# Patient Record
Sex: Female | Born: 1945 | Race: Black or African American | Hispanic: No | Marital: Married | Smoking: Former smoker
Health system: Southern US, Community
[De-identification: ages and names within clinical notes are randomized; demographics above are authoritative.]

## PROBLEM LIST (undated history)

## (undated) DIAGNOSIS — IMO0001 Reserved for inherently not codable concepts without codable children: Secondary | ICD-10-CM

## (undated) DIAGNOSIS — K219 Gastro-esophageal reflux disease without esophagitis: Secondary | ICD-10-CM

## (undated) DIAGNOSIS — Z8489 Family history of other specified conditions: Secondary | ICD-10-CM

## (undated) DIAGNOSIS — R011 Cardiac murmur, unspecified: Secondary | ICD-10-CM

## (undated) DIAGNOSIS — E119 Type 2 diabetes mellitus without complications: Secondary | ICD-10-CM

## (undated) DIAGNOSIS — H409 Unspecified glaucoma: Secondary | ICD-10-CM

## (undated) DIAGNOSIS — I493 Ventricular premature depolarization: Secondary | ICD-10-CM

## (undated) DIAGNOSIS — D869 Sarcoidosis, unspecified: Secondary | ICD-10-CM

## (undated) DIAGNOSIS — D649 Anemia, unspecified: Secondary | ICD-10-CM

## (undated) DIAGNOSIS — J42 Unspecified chronic bronchitis: Secondary | ICD-10-CM

## (undated) DIAGNOSIS — G459 Transient cerebral ischemic attack, unspecified: Secondary | ICD-10-CM

## (undated) DIAGNOSIS — M1990 Unspecified osteoarthritis, unspecified site: Secondary | ICD-10-CM

## (undated) DIAGNOSIS — M797 Fibromyalgia: Secondary | ICD-10-CM

## (undated) DIAGNOSIS — J45909 Unspecified asthma, uncomplicated: Secondary | ICD-10-CM

## (undated) DIAGNOSIS — Z9989 Dependence on other enabling machines and devices: Secondary | ICD-10-CM

## (undated) DIAGNOSIS — G4733 Obstructive sleep apnea (adult) (pediatric): Secondary | ICD-10-CM

## (undated) DIAGNOSIS — I4729 Other ventricular tachycardia: Secondary | ICD-10-CM

## (undated) DIAGNOSIS — Z8719 Personal history of other diseases of the digestive system: Secondary | ICD-10-CM

## (undated) DIAGNOSIS — F419 Anxiety disorder, unspecified: Secondary | ICD-10-CM

## (undated) DIAGNOSIS — I491 Atrial premature depolarization: Secondary | ICD-10-CM

## (undated) DIAGNOSIS — I472 Ventricular tachycardia: Secondary | ICD-10-CM

## (undated) DIAGNOSIS — E785 Hyperlipidemia, unspecified: Secondary | ICD-10-CM

## (undated) DIAGNOSIS — Z9289 Personal history of other medical treatment: Secondary | ICD-10-CM

## (undated) DIAGNOSIS — I10 Essential (primary) hypertension: Secondary | ICD-10-CM

## (undated) DIAGNOSIS — I2510 Atherosclerotic heart disease of native coronary artery without angina pectoris: Secondary | ICD-10-CM

## (undated) HISTORY — PX: COLONOSCOPY W/ BIOPSIES AND POLYPECTOMY: SHX1376

## (undated) HISTORY — DX: Ventricular tachycardia: I47.2

## (undated) HISTORY — DX: Atrial premature depolarization: I49.1

## (undated) HISTORY — DX: Other ventricular tachycardia: I47.29

## (undated) HISTORY — DX: Hyperlipidemia, unspecified: E78.5

## (undated) HISTORY — DX: Atherosclerotic heart disease of native coronary artery without angina pectoris: I25.10

## (undated) HISTORY — DX: Ventricular premature depolarization: I49.3

## (undated) HISTORY — DX: Sarcoidosis, unspecified: D86.9

## (undated) HISTORY — DX: Personal history of other medical treatment: Z92.89

## (undated) HISTORY — DX: Fibromyalgia: M79.7

## (undated) HISTORY — PX: TONSILLECTOMY: SUR1361

## (undated) HISTORY — PX: BREAST SURGERY: SHX581

## (undated) HISTORY — PX: EXTERNAL EAR SURGERY: SHX627

---

## 1980-02-11 HISTORY — PX: TOTAL ABDOMINAL HYSTERECTOMY: SHX209

## 1990-02-10 HISTORY — PX: PLACEMENT OF BREAST IMPLANTS: SHX6334

## 1998-05-07 ENCOUNTER — Other Ambulatory Visit: Admission: RE | Admit: 1998-05-07 | Discharge: 1998-05-07 | Payer: Self-pay | Admitting: Obstetrics & Gynecology

## 1998-07-05 ENCOUNTER — Ambulatory Visit (HOSPITAL_COMMUNITY): Admission: RE | Admit: 1998-07-05 | Discharge: 1998-07-05 | Payer: Self-pay | Admitting: Gastroenterology

## 1998-07-05 ENCOUNTER — Encounter: Payer: Self-pay | Admitting: Gastroenterology

## 1998-12-10 ENCOUNTER — Encounter: Payer: Self-pay | Admitting: Urology

## 1998-12-10 ENCOUNTER — Encounter: Admission: RE | Admit: 1998-12-10 | Discharge: 1998-12-10 | Payer: Self-pay | Admitting: Urology

## 1999-01-01 ENCOUNTER — Encounter (INDEPENDENT_AMBULATORY_CARE_PROVIDER_SITE_OTHER): Payer: Self-pay | Admitting: *Deleted

## 1999-01-01 ENCOUNTER — Ambulatory Visit (HOSPITAL_BASED_OUTPATIENT_CLINIC_OR_DEPARTMENT_OTHER): Admission: RE | Admit: 1999-01-01 | Discharge: 1999-01-01 | Payer: Self-pay | Admitting: Urology

## 2000-01-20 ENCOUNTER — Other Ambulatory Visit: Admission: RE | Admit: 2000-01-20 | Discharge: 2000-01-20 | Payer: Self-pay | Admitting: Obstetrics and Gynecology

## 2000-04-04 ENCOUNTER — Emergency Department (HOSPITAL_COMMUNITY): Admission: EM | Admit: 2000-04-04 | Discharge: 2000-04-04 | Payer: Self-pay | Admitting: *Deleted

## 2000-04-04 ENCOUNTER — Encounter: Payer: Self-pay | Admitting: Emergency Medicine

## 2001-02-18 ENCOUNTER — Other Ambulatory Visit: Admission: RE | Admit: 2001-02-18 | Discharge: 2001-02-18 | Payer: Self-pay | Admitting: Urology

## 2001-02-22 ENCOUNTER — Encounter: Payer: Self-pay | Admitting: Urology

## 2001-02-22 ENCOUNTER — Encounter: Admission: RE | Admit: 2001-02-22 | Discharge: 2001-02-22 | Payer: Self-pay | Admitting: Urology

## 2001-06-30 ENCOUNTER — Other Ambulatory Visit: Admission: RE | Admit: 2001-06-30 | Discharge: 2001-06-30 | Payer: Self-pay | Admitting: Obstetrics and Gynecology

## 2001-11-10 ENCOUNTER — Encounter: Payer: Self-pay | Admitting: Orthopedic Surgery

## 2001-11-10 ENCOUNTER — Encounter: Admission: RE | Admit: 2001-11-10 | Discharge: 2001-11-10 | Payer: Self-pay | Admitting: Orthopedic Surgery

## 2001-11-28 ENCOUNTER — Inpatient Hospital Stay (HOSPITAL_COMMUNITY): Admission: AD | Admit: 2001-11-28 | Discharge: 2001-11-28 | Payer: Self-pay | Admitting: Obstetrics and Gynecology

## 2001-11-28 ENCOUNTER — Encounter (INDEPENDENT_AMBULATORY_CARE_PROVIDER_SITE_OTHER): Payer: Self-pay | Admitting: Specialist

## 2002-02-11 ENCOUNTER — Emergency Department (HOSPITAL_COMMUNITY): Admission: EM | Admit: 2002-02-11 | Discharge: 2002-02-11 | Payer: Self-pay | Admitting: Emergency Medicine

## 2002-02-11 ENCOUNTER — Encounter: Payer: Self-pay | Admitting: Emergency Medicine

## 2002-02-17 ENCOUNTER — Ambulatory Visit (HOSPITAL_COMMUNITY): Admission: RE | Admit: 2002-02-17 | Discharge: 2002-02-17 | Payer: Self-pay | Admitting: Family Medicine

## 2002-02-24 ENCOUNTER — Encounter: Payer: Self-pay | Admitting: Family Medicine

## 2002-02-24 ENCOUNTER — Encounter: Admission: RE | Admit: 2002-02-24 | Discharge: 2002-02-24 | Payer: Self-pay | Admitting: Family Medicine

## 2002-03-25 ENCOUNTER — Ambulatory Visit (HOSPITAL_COMMUNITY): Admission: RE | Admit: 2002-03-25 | Discharge: 2002-03-25 | Payer: Self-pay | Admitting: Cardiology

## 2002-03-25 ENCOUNTER — Encounter: Payer: Self-pay | Admitting: Cardiology

## 2002-06-28 ENCOUNTER — Other Ambulatory Visit: Admission: RE | Admit: 2002-06-28 | Discharge: 2002-06-28 | Payer: Self-pay | Admitting: Obstetrics and Gynecology

## 2002-09-28 ENCOUNTER — Emergency Department (HOSPITAL_COMMUNITY): Admission: EM | Admit: 2002-09-28 | Discharge: 2002-09-28 | Payer: Self-pay | Admitting: Emergency Medicine

## 2002-12-20 ENCOUNTER — Ambulatory Visit (HOSPITAL_COMMUNITY): Admission: RE | Admit: 2002-12-20 | Discharge: 2002-12-20 | Payer: Self-pay | Admitting: Obstetrics and Gynecology

## 2003-01-11 ENCOUNTER — Encounter (INDEPENDENT_AMBULATORY_CARE_PROVIDER_SITE_OTHER): Payer: Self-pay

## 2003-01-11 ENCOUNTER — Ambulatory Visit (HOSPITAL_COMMUNITY): Admission: RE | Admit: 2003-01-11 | Discharge: 2003-01-11 | Payer: Self-pay | Admitting: Obstetrics and Gynecology

## 2003-01-11 ENCOUNTER — Encounter (INDEPENDENT_AMBULATORY_CARE_PROVIDER_SITE_OTHER): Payer: Self-pay | Admitting: *Deleted

## 2003-05-12 ENCOUNTER — Encounter: Admission: RE | Admit: 2003-05-12 | Discharge: 2003-05-12 | Payer: Self-pay | Admitting: Family Medicine

## 2003-08-10 ENCOUNTER — Encounter: Admission: RE | Admit: 2003-08-10 | Discharge: 2003-08-10 | Payer: Self-pay | Admitting: Ophthalmology

## 2003-08-15 ENCOUNTER — Encounter: Admission: RE | Admit: 2003-08-15 | Discharge: 2003-08-15 | Payer: Self-pay | Admitting: Ophthalmology

## 2003-12-25 ENCOUNTER — Ambulatory Visit (HOSPITAL_BASED_OUTPATIENT_CLINIC_OR_DEPARTMENT_OTHER): Admission: RE | Admit: 2003-12-25 | Discharge: 2003-12-25 | Payer: Self-pay | Admitting: Family Medicine

## 2004-07-03 ENCOUNTER — Other Ambulatory Visit: Admission: RE | Admit: 2004-07-03 | Discharge: 2004-07-03 | Payer: Self-pay | Admitting: Obstetrics and Gynecology

## 2004-09-30 ENCOUNTER — Encounter: Admission: RE | Admit: 2004-09-30 | Discharge: 2004-09-30 | Payer: Self-pay | Admitting: Ophthalmology

## 2005-06-27 ENCOUNTER — Other Ambulatory Visit: Admission: RE | Admit: 2005-06-27 | Discharge: 2005-06-27 | Payer: Self-pay | Admitting: Obstetrics and Gynecology

## 2006-06-23 ENCOUNTER — Ambulatory Visit (HOSPITAL_COMMUNITY): Admission: RE | Admit: 2006-06-23 | Discharge: 2006-06-23 | Payer: Self-pay | Admitting: Gastroenterology

## 2007-01-11 ENCOUNTER — Ambulatory Visit (HOSPITAL_COMMUNITY): Admission: RE | Admit: 2007-01-11 | Discharge: 2007-01-11 | Payer: Self-pay | Admitting: Ophthalmology

## 2007-03-13 ENCOUNTER — Emergency Department (HOSPITAL_COMMUNITY): Admission: EM | Admit: 2007-03-13 | Discharge: 2007-03-13 | Payer: Self-pay | Admitting: Emergency Medicine

## 2007-04-08 ENCOUNTER — Encounter: Admission: RE | Admit: 2007-04-08 | Discharge: 2007-04-08 | Payer: Self-pay | Admitting: Otolaryngology

## 2007-05-04 ENCOUNTER — Ambulatory Visit (HOSPITAL_COMMUNITY): Admission: RE | Admit: 2007-05-04 | Discharge: 2007-05-04 | Payer: Self-pay | Admitting: Cardiovascular Disease

## 2007-05-04 HISTORY — PX: CARDIAC CATHETERIZATION: SHX172

## 2007-09-10 ENCOUNTER — Encounter: Admission: RE | Admit: 2007-09-10 | Discharge: 2007-09-10 | Payer: Self-pay | Admitting: Rheumatology

## 2008-07-11 ENCOUNTER — Encounter: Admission: RE | Admit: 2008-07-11 | Discharge: 2008-07-11 | Payer: Self-pay | Admitting: Family Medicine

## 2008-12-20 ENCOUNTER — Encounter: Admission: RE | Admit: 2008-12-20 | Discharge: 2008-12-20 | Payer: Self-pay | Admitting: Internal Medicine

## 2010-01-04 ENCOUNTER — Ambulatory Visit: Payer: Self-pay | Admitting: Cardiovascular Disease

## 2010-01-21 ENCOUNTER — Encounter: Payer: Self-pay | Admitting: Internal Medicine

## 2010-01-21 ENCOUNTER — Ambulatory Visit: Payer: Self-pay

## 2010-01-21 ENCOUNTER — Ambulatory Visit (HOSPITAL_COMMUNITY): Admission: RE | Admit: 2010-01-21 | Payer: Self-pay | Source: Home / Self Care | Admitting: Cardiovascular Disease

## 2010-03-06 ENCOUNTER — Ambulatory Visit: Payer: Self-pay | Admitting: Cardiovascular Disease

## 2010-04-16 ENCOUNTER — Other Ambulatory Visit: Payer: Self-pay | Admitting: Adult Health Nurse Practitioner

## 2010-04-16 ENCOUNTER — Other Ambulatory Visit: Payer: Self-pay | Admitting: Internal Medicine

## 2010-04-16 ENCOUNTER — Ambulatory Visit
Admission: RE | Admit: 2010-04-16 | Discharge: 2010-04-16 | Disposition: A | Discharge status: Home / Self Care | Payer: 59 | Source: Ambulatory Visit | Attending: Adult Health Nurse Practitioner | Admitting: Adult Health Nurse Practitioner

## 2010-04-16 DIAGNOSIS — R059 Cough, unspecified: Secondary | ICD-10-CM

## 2010-04-16 DIAGNOSIS — R05 Cough: Secondary | ICD-10-CM

## 2010-04-16 DIAGNOSIS — R509 Fever, unspecified: Secondary | ICD-10-CM

## 2010-06-25 NOTE — Cardiovascular Report (Signed)
NAMEKARLEN, BARBAR NO.:  [id number]   MEDICAL RECORD NO.:  [id number]          PATIENT TYPE:  OIB   LOCATION:  2861                         FACILITY:  [facility]   PHYSICIAN:  Vesta Mixer, M.D. DATE OF BIRTH:  August 14, 1945   DATE OF PROCEDURE:  05/04/2007  DATE OF DISCHARGE:                            CARDIAC CATHETERIZATION   Sheila Reynolds is a 65 year old female with a history of chest pain  and some nonsustained ventricular tachycardia.  She presented to the  office yesterday with episodes consistent with unstable angina.  Her  pains consisted of chest pressure with radiation through to her  intrascapular region.  She has also been having lots fluttering  consistent with her previous episodes of nonsustained VT.  She was  referred for heart catheterization for further evaluation.   PROCEDURE:  Left heart catheterization.   The right femoral artery was cannulated using the assistance of a Smart  needle.  At the end of the case, right femoral artery angiogram revealed  fairly normal and disease-free femoral artery.  The vessel was closed  using an AngioSeal technique.   HEMODYNAMIC RESULTS:  LV pressure is 112/14, and aortic pressure is  106/63.   ANGIOGRAPHY:  Left main:  The left main is smooth and normal.   The left anterior descending artery has sequential 40% stenosis in the  proximal vessel.  The second stenosis is just prior to the bifurcation  of the first diagonal.  The mid and distal LAD had only minor luminal  irregularities.   The first diagonal has a 30-40% proximal stenosis.  There is  calcification in this proximal vessel.   The left circumflex artery is large and is codominant.  The obtuse  marginal artery has minor luminal irregularities.  The distal circumflex  is essentially normal.   The posterior descending artery is relatively small but has minor  luminal irregularities.   The right coronary artery is small and is  codominant.  It supplies a  very small posterior descending artery which is unremarkable.   Left ventriculogram was performed in the 30-degree RAO position.  It  reveals overall normal left ventricular systolic function.  Ejection  fraction 65-70%.   COMPLICATIONS:  None.   CONCLUSION:  Moderate coronary artery disease primarily involving the  left anterior descending.  At this point, I do not think that this LAD  is ischemic, although we will need to do a Cardiolite study to verify  this for sure.  We will continue with medical therapy for the time  being.           ______________________________  Vesta Mixer, M.D.     PJN/MEDQ  D:  05/04/2007  T:  05/04/2007  Job:  [id number]   cc:   Jocelyn Lamer D. Pecola Leisure, M.D.

## 2010-06-25 NOTE — H&P (Signed)
NAMESHARIKA, MOSQUERA NO.:  [id number]   MEDICAL RECORD NO.:  [id number]          PATIENT TYPE:  EMS   LOCATION:  ED                           FACILITY:  [facility]   PHYSICIAN:  Vesta Mixer, M.D. DATE OF BIRTH:  May 23, 1945   DATE OF ADMISSION:  03/13/2007  DATE OF DISCHARGE:  03/13/2007                              HISTORY & PHYSICAL   Evin Chirco is a middle-aged female with a history of  hyperlipidemia, chest pains, and nonsustained ventricular tachycardia.  She is admitted for symptoms consistent with unstable angina.   Ms. Janosik has a history of hyperlipidemia, chest pains, and some  nonsustained ventricular tachycardia.  She has been followed here for  the past year or so.  She has had symptoms of palpitations and on [facility] monitoring has been found to have nonsustained VT.  She has  had a negative stress Cardiolite study a year or so ago.  She was found  to have some mild anterior wall attenuation, but this was thought to be  breast artifact.  She now presents with worsening episodes of chest  pains and chest flutters.  She states that it is similar to her previous  episodes, only worse.  She also had some stinging in her chest, with  radiation through to her back.  These episodes occur every several  minutes.  She also had some dyspnea on exertion.  The dyspnea does not  seem to correlate with the episodes of chest pain.   She is now admitted to the hospital for heart catheterization.   She denies any syncope or pre-syncope.  She denies any PND or orthopnea.   CURRENT MEDICATIONS:  1. HCTZ 25 mg a day.  2. Prednisolone drops 4 times a day.  3. Alprazolam 1 mg a day.  4. Ibuprofen 800 mg a day.  5. Toprol XL 75 mg a day.  6. Protonix 40 mg a day.  7. Lipitor 20 mg a day.  8. Azopt 3 times a day.   ALLERGIES:  None, but she is intolerant to Crestor which cause muscle  aches and pains.   PAST MEDICAL HISTORY:  1. Hypertension.  2.  Gastroesophageal reflux.  3. Hyperlipidemia.  4. Non-sustained ventricular tachycardia.  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   SOCIAL HISTORY:  The patient used to smoke but quit 25 years ago.  She  drinks wine occasionally.   FAMILY HISTORY:  Her father has a history of coronary artery disease and  peripheral vascular disease.  He has a history of diabetes and  hypertension.  Her mother died at age 84, due to stomach cancer.   REVIEW OF SYSTEMS:  Is reviewed and is essentially negative, except as  noted in the HPI.   EXAMINATION:  GENERAL:  She is a middle-aged female in no acute  distress.  She is alert and oriented x3, and her mood and affect are  normal.  VITAL SIGNS:  Her weight is 168, blood pressure is 114/60 with a heart  rate of 72.  HEENT:  Exam reveals 2+ carotids.  She has no bruits, no  JVD, no  thyromegaly.  LUNGS:  Clear to auscultation.  HEART:  Regular rate S1-S2.  ABDOMEN:  Abdominal exam reveals good bowel sounds and is nontender.  EXTREMITIES:  She has no clubbing, cyanosis or edema.  NEURO:  Exam is nonfocal.   Her EKG reveals normal sinus rhythm.  She has nonspecific ST and T wave  changes.   Ms. Denno presents with some episodes of chest pain and some  palpitations.  She has known ventricular tachycardia.  I am somewhat  concerned that this may represent unstable angina.  I have recommended  her for heart catheterization.  We have discussed the benefits and  options of heart catheterization.  She understands and agrees to  proceed.  We have scheduled it for tomorrow afternoon.  I have given her  a prescription for nitroglycerin tablets.  She is to call us right away  or call 9-1-1, if she has any worsening pains.           ______________________________  Vesta Mixer, M.D.     PJN/MEDQ  D:  05/03/2007  T:  05/03/2007  Job:  [id number]   cc:   Jocelyn Lamer D. Pecola Leisure, M.D.

## 2010-06-28 NOTE — Procedures (Signed)
Sheila Reynolds, Sheila Reynolds            ACCOUNT NO.:  [id number]   MEDICAL RECORD NO.:  [id number]          PATIENT TYPE:  OUT   LOCATION:  SLEEP CENTER                 FACILITY:  [facility]   PHYSICIAN:  Clinton D. Maple Hudson, M.D. DATE OF BIRTH:  [birth year]   DATE OF STUDY:  12/25/2003                              NOCTURNAL POLYSOMNOGRAM   REFERRING PHYSICIAN:  Betti D. Pecola Leisure, M.D.   INDICATION FOR STUDY AND HISTORY:  Insomnia with sleep apnea.   SLEEP ARCHITECTURE:  Total sleep time 394 minutes with sleep efficiency of  96%.  Stage I was 5%; stage II, 74%; Stages III and IV were absent.  REM was  21% of total sleep time.  Latency to sleep onset 7.5 minutes, latency to REM  62 minutes.  Awake after sleep onset 8.5 minutes.  Arousal index 22.7.  Note  that Epworth sleepiness score was 20/24 with BMI 29.7, weight 164 pounds.   RESPIRATORY DATA:  RDI 2.9 per hour which is within normal limits.  This  included 1 obstructive apnea and 18 hypopneas.  All events were while  sleeping supine.[name]  REM RDI 4.4 per hour.   OXYGEN DATA:  Moderate and occasionally loud snoring with oxygen  desaturation to a nadir of 91% during apneas and hypopneas.  Mean oxygen  saturation through the study was 96% on room air.   CARDIAC DATA:  Normal sinus rhythm.   MOVEMENT AND PARASOMNIA:  Occasional limb jerks with insignificant impact on  sleep.  No bathroom trips.   IMPRESSION AND RECOMMENDATIONS:  Occasional sleep disorder breathing event,  within normal limits with RDI 2.9 per hour and normal oxygenation at 91%.  Sleep was well maintained for the most part, largely alternating between  Stage II and REM.  She had taken alprazolam at the beginning of the study.   Clinton D. Maple Hudson, M.D.  Diplomate, Biomedical engineer of Sleep Medicine                                                           Clinton D. Maple Hudson, M.D.  Diplomate, [employer] Board   CDY/MEDQ  D:  12/31/2003 10:01:39  T:  12/31/2003 10:22:09  Job:   [id number]   cc:   Jocelyn Lamer D. Pecola Leisure, M.D.  [address]  [place]  [place] [address]  Fax: [phone]

## 2010-06-28 NOTE — Op Note (Signed)
NAME:  Sheila Reynolds, Sheila Reynolds                      ACCOUNT NO.:  [id number]   MEDICAL RECORD NO.:  [id number]                   PATIENT TYPE:  AMB   LOCATION:  SDC                                  FACILITY:  [facility]   PHYSICIAN:  Naima A. Dillard, M.D.              DATE OF BIRTH:  Apr 19, 1945   DATE OF PROCEDURE:  01/11/2003  DATE OF DISCHARGE:                                 OPERATIVE REPORT   PREOPERATIVE DIAGNOSIS:  Pelvic pain with vaginal cuff mass.   POSTOPERATIVE DIAGNOSES:  1. Chronic pelvic pain.  2. Pelvic-abdominal adhesions.  3. Pelvic adhesions.  4. Peritoneal cyst.   PROCEDURES:  1. Open laparoscopy.  2. Lysis of adhesions.  3. Biopsy of anterior cul-de-sac.   SURGEON:  Naima A. Normand Sloop, M.D.   ASSISTANTMarquis Lunch. Adline Peals.   ANESTHESIA:  General endotracheal tube.   ESTIMATED BLOOD LOSS:  Minimal.   FLUIDS REPLACED:  1500 mL crystalloid.   URINE OUTPUT:  100 mL clear urine.   COMPLICATIONS:  None.   FINDINGS:  A small peritoneal cyst along the vaginal cuff with mild scarring  or fibrosis consistent with scar tissue.  Bilateral pelvic sidewall small  bowel adhesions.  Left ovary was absent.  Right ovary was adherent to the  right pelvic sidewall.  There were omental abdominal wall adhesions.  Normal  appendix.  The patient went to the recovery room in stable condition.   PROCEDURE IN DETAIL:  The patient was taken to the operating room, where she  was given general endotracheal anesthesia and placed in dorsal lithotomy  position and prepped and draped in the normal sterile fashion.  A Foley  catheter was placed and a sponge on a stick or a sponge with a ring forceps  was placed in the vagina as a means to identify the vaginal cuff.  Attention  was then turned to the umbilicus, where 5 mL 0.25% Marcaine was placed and  an incision was done with the scalpel in a 10 mm vertical incision and  carried down to the fascia.  The fascia was then grasped with  Kochers, cut  with Mayo scissors.  The peritoneum was identified, tented up, and the  Hasson was placed.  The Hasson was anchored with a 2-0 Vicryl suture placed  in the fascia.  Intra-abdominal placement was confirmed with the  laparoscope.  The abdomen was then insufflated with CO2 gas.  The findings  noted above were seen.  A left lower quadrant and right lower quadrant port  was placed under direct visualization after two 2.5 mL of 0.25% Marcaine was  placed, and the knife was used to make 5 mm incisions and both ports placed  under direct visualization and also paying close attention to avoid the  epigastric vessels.  The left pelvic sidewall adhesions were cut,  coagulated, hemostasis was assured.  The left ovary was absent.  The  patient's left side of  the vaginal cuff where the peritoneal cyst was  removed was sent to pathology.  Upon looking there was some blood seen in  the cul-de-sac, which was irrigated and sent to pathology.  The fibrotic  tissue was also biopsied and sent to pathology.  There were no other masses  seen.  The sponge on a stick in the vagina was then used to identify the  cuff, and there was no large mass seen.  The patient's right ovary was  noticed to be adherent to the pelvic wall.  Adhesions were broken up from  the right ovary to the pelvic wall, also the right ovary to the small bowel.  Hemostasis was assured.  Irrigation was done of the pelvis and along where  lysis of adhesions took place.  Hemostasis was assured.  The patient's  appendix was seen and noted to be normal.  The patient had upper abdominal  wall adhesions up above the port which could not be lysed and most likely  represented omentum to the abdominal wall.  All ports were removed under  direct visualization.  The gas was allowed to leave the abdomen.  The Hasson  fascia, the stitches were tied, hemostasis was assured.  The skin was closed  in subcuticular fashion using 4-0 Vicryl.  The  other two skin incisions were  closed using 4-0 Vicryl.  Sponge, lap, and needle counts were correct x2.  The sponge on a stick was removed from the vagina.  The patient tolerated  the procedure well.  Sponge, lap, and needle counts were correct x2.  The  patient went to the recovery room in stable condition.                                               Naima A. Normand Sloop, M.D.    NAD/MEDQ  D:  01/11/2003  T:  01/12/2003  Job:  [id number]

## 2010-06-28 NOTE — H&P (Signed)
NAME:  Sheila Reynolds, Sheila Reynolds                      ACCOUNT NO.:  [id number]   MEDICAL RECORD NO.:  [id number]                   PATIENT TYPE:  AMB   LOCATION:  SDC                                  FACILITY:  [facility]   PHYSICIAN:  Naima A. Dillard, M.D.              DATE OF BIRTH:  15-Jul-1945   DATE OF ADMISSION:  01/11/2003  DATE OF DISCHARGE:                                HISTORY & PHYSICAL   CHIEF COMPLAINT:  Pelvic pain and vaginal cuff mass.   The patient is a 65 year old African-American female who presented to me  secondary to incidental mass being found at her vaginal cuff and also  complaining of right lower quadrant pain and back pain.  The patient did not  have any relief with Flexeril.  She said Dilaudid, however, was too strong  and made her too sleepy.  She denied any history of urinary tract infection  symptoms, any history of kidney stones.  She has vaginal itching but has no  vaginal discharge.  Denied having any dyspareunia.  The patient denies any  change in bowel habits.  Denies any rectal bleeding, constipation.  She  denied any nausea or vomiting.  Denied any history of endometriosis.  The  patient did have a recent CT and ultrasound, which found about a 10 mm  heterogeneous echo texture identified at the vaginal cuff measuring 10 mm at  maximum diameter.  There was a small hypoechoic central focus, which was  compatible with cystic change.  There was no substantial color Doppler  signal within the focal area.  The patient did have a normal-appearing right  ovary, which measured 0.7 x 0.4 x 1.0, and the left ovary was not  visualized.  The patient has stated she has been having the right lower  quadrant pain and back pain for the last four months and desired  intervention other than the Dilaudid and other medications she was taking.   Her medications include Valtrex p.r.n.   PAST MEDICAL HISTORY:  Significant high blood pressure, irritable bowel  syndrome, and  arthritis.  The patient was unsure what medicine she takes for  high blood pressure.   PAST SURGICAL HISTORY:  Significant for  a total abdominal hysterectomy in  the 1980.  She had left knee surgery.  She also had tonsillectomy and right  ear surgery.   FAMILY HISTORY:  Significant for diabetes and hypertension and a paternal  aunt with breast cancer.   SOCIAL HISTORY:  Negative for alcohol, drug, and tobacco use.  She quit  smoking tobacco 15 years ago.   REVIEW OF SYSTEMS:  CARDIOVASCULAR:  She has mitral valve prolapse.  She  does have antibiotics with surgery.  GASTROINTESTINAL:  She has irritable  bowel syndrome.  RESPIRATORY:  Unremarkable.  MUSCULOSKELETAL:  She has  arthritis.  PSYCHIATRIC:  Unremarkable.   PHYSICAL EXAMINATION:  VITAL SIGNS:  The patient weighs 158 pounds.  Her  blood  pressure is 110/70.  HEENT:  Pupils are equal.  Hearing is normal.  Throat is clear.  NECK:  Thyroid is not enlarged.  CARDIAC:  Heart has a regular rate and rhythm.  CHEST:  Clear to auscultation bilaterally.  BREASTS:  No masses, discharge, retraction, or nipple retraction  bilaterally.  BACK:  No CVA tenderness bilaterally.  ABDOMEN:  Nontender without any masses or organomegaly.  EXTREMITIES:  No clubbing, cyanosis, or edema.  NEUROLOGIC:  Within normal limits.  PELVIC:  External genitalia and vagina are normal.  The uterus and cervix  are surgically absent.  Adnexa has no masses.  Right adnexa is slightly  tender.  Rectovaginal:  No masses.   ASSESSMENT:  Vaginal cuff mass and pelvic pain.  The patient was told of a  differential diagnosis of some kind of vaginal cancer, scar tissue, could be  a parasitic fibroid, could be an ovary.  She was given the option of  laparoscopy and biopsy and removal of this mass, also is to evaluate the  pelvis because of the right lower quadrant pain, or observation and just  redo another ultrasound on this mass in about four to six weeks.  The   patient chose to have laparoscopy with biopsy and removal of the mass and  also evaluation of chronic pelvic pain.  The patient understands that the  risks are but not limited to bleeding, infection, damage to internal organs  such as bowel, bladder, and major blood vessels.  The patient does have a  history of mitral valve prolapse, so we plan to give antibiotics before  surgery.                                               Naima A. Normand Sloop, M.D.    NAD/MEDQ  D:  01/11/2003  T:  01/11/2003  Job:  [id number]

## 2010-07-16 ENCOUNTER — Other Ambulatory Visit: Payer: Self-pay | Admitting: Cardiovascular Disease

## 2010-07-16 NOTE — Telephone Encounter (Signed)
Fax received from pharmacy. Refill completed. Jodette Torsten Weniger RN  

## 2010-09-23 ENCOUNTER — Encounter: Payer: Self-pay | Admitting: Cardiovascular Disease

## 2010-09-24 ENCOUNTER — Ambulatory Visit (INDEPENDENT_AMBULATORY_CARE_PROVIDER_SITE_OTHER): Payer: Self-pay | Admitting: Cardiovascular Disease

## 2010-09-24 ENCOUNTER — Encounter: Payer: Self-pay | Admitting: Cardiovascular Disease

## 2010-09-24 DIAGNOSIS — I4729 Other ventricular tachycardia: Secondary | ICD-10-CM | POA: Insufficient documentation

## 2010-09-24 DIAGNOSIS — R609 Edema, unspecified: Secondary | ICD-10-CM

## 2010-09-24 DIAGNOSIS — R0789 Other chest pain: Secondary | ICD-10-CM | POA: Insufficient documentation

## 2010-09-24 DIAGNOSIS — I472 Ventricular tachycardia: Secondary | ICD-10-CM | POA: Insufficient documentation

## 2010-09-24 DIAGNOSIS — R600 Localized edema: Secondary | ICD-10-CM | POA: Insufficient documentation

## 2010-09-24 DIAGNOSIS — R002 Palpitations: Secondary | ICD-10-CM

## 2010-09-24 DIAGNOSIS — E785 Hyperlipidemia, unspecified: Secondary | ICD-10-CM

## 2010-09-24 DIAGNOSIS — R079 Chest pain, unspecified: Secondary | ICD-10-CM

## 2010-09-24 LAB — TSH: TSH: 0.88 u[IU]/mL (ref 0.35–5.50)

## 2010-09-24 LAB — BASIC METABOLIC PANEL
Chloride: 107 mEq/L (ref 96–112)
GFR: 108.62 mL/min (ref >60.00)
Glucose, Bld: 125 mg/dL — ABNORMAL HIGH (ref 70–99)
Potassium: 4.9 mEq/L (ref 3.5–5.1)
Sodium: 142 mEq/L (ref 135–145)

## 2010-09-24 LAB — HEPATIC FUNCTION PANEL
ALT: 24 U/L (ref 0–35)
Alkaline Phosphatase: 89 U/L (ref 39–117)
Bilirubin, Direct: 0.1 mg/dL (ref 0.0–0.3)

## 2010-09-24 NOTE — Assessment & Plan Note (Signed)
She's not had any severe episodes of chest pain recently.

## 2010-09-24 NOTE — Assessment & Plan Note (Addendum)
She presents with several months of bilateral leg edema. The left leg seems to be more swollen the right. We will get a venous duplex scan for further assessment. Will also get an echocardiogram.   I've asked her to elevate her legs as much as possible. I suggested that she may wear some compression hose when the weather gets cooler. I've asked her to try to walk around as much as possible and to minimize the times that she sits at a desk.

## 2010-09-24 NOTE — Progress Notes (Signed)
Sheila Reynolds Date of Birth  11-30-1945 [facility] [address]     [address] [place], [place]  [address] [phone]  Fax  [phone]  History of Present Illness:  Mrs. heard is a 65 year old lady with a history of chest pain, hyperlipidemia, nonsustained ventricular tachycardia, and mild to moderate coronary artery disease.  This past Sunday she had fairly severe palpitations.  She had a pins and needle sensation in her left chest and left breast area.  The episode lasted 15-20 seconds. She developed some anxiety. She did not have any syncope or presyncope or diaphoresis.  She's had a heart catheterization which revealed only mild coronary artery irregularities.  She had weakness for the next hour or so.  She walks several times a week  For approximately 20-25 minutes.  She's had significant ankle edema for the past several months. Her ankles swell progressively through the day.   The left leg swelling is considerably more than right leg swelling by history She elevates them at night when she goes to bed.   The swelling improved overnight but is still present.  Current Outpatient Prescriptions on File Prior to Visit  Medication Sig Dispense Refill  . ALPRAZolam (XANAX) 1 MG tablet Take 1 mg by mouth as needed.        [name] aspirin 81 MG tablet Take 81 mg by mouth daily.        [name] atorvastatin (LIPITOR) 20 MG tablet Take 20 mg by mouth daily.        . brinzolamide (AZOPT) 1 % ophthalmic suspension 1 drop 3 (three) times daily.       . hydrochlorothiazide 25 MG tablet Take 25 mg by mouth. Taking 3 Times a Week      . Loteprednol Etabonate (LOTEMAX OP) Apply to eye 3 (three) times daily.       . metoprolol (TOPROL-XL) 50 MG 24 hr tablet Take 50 mg by mouth daily. 50 mg in the morning and 25 mg at night       . Travoprost (TRAVATAN OP) Apply to eye daily.        . ergocalciferol (VITAMIN D2) 50000 UNITS capsule Take 50,000 Units by mouth once a  week. Twice a week       . FENOFIBRATE PO Take by mouth daily.        . metoprolol (LOPRESSOR) 50 MG tablet TAKE 1 TABLET IN THE MORNING, AND 1/2 IN THE EVENING  45 tablet  5  . potassium chloride (KLOR-CON) 10 MEQ CR tablet Take 10 mEq by mouth daily.          Allergies  Allergen Reactions  . Crestor (Rosuvastatin Calcium) Other (See Comments)    She is intolerant to crestor which caused muscle aches    Past Medical History  Diagnosis Date  . Hyperlipidemia   . Chest pain   . Nonsustained ventricular tachycardia   . Fibromyalgia   . Coronary artery disease     Mild to moderate CAD  . Palpitations     Past Surgical History  Procedure Date  . Cardiac catheterization 05/04/2007    reveals overall normal left ventricular systolic function. Ejection fraction 65-70%    History  Smoking status  . Former Smoker  Smokeless tobacco  . Not on file    History  Alcohol Use No    No family history on file.  Reviw of Systems:  Reviewed in the HPI.  All other systems are  negative.  Physical Exam: BP 134/82  Pulse 53  Ht 5\' 2"  (1.575 m)  Wt 157 lb 9.6 oz (71.487 kg)  BMI 28.83 kg/m2 The patient is alert and oriented x 3.  The mood and affect are normal.   Skin: warm and dry.  Color is normal.    HEENT:   the sclera are nonicteric.  The mucous membranes are moist.  The carotids are 2+ without bruits.  There is no thyromegaly.  There is no JVD.    Lungs: clear.  The chest wall is non tender.    Heart: regular rate with a normal S1 and S2.  There are no murmurs, gallops, or rubs. The PMI is not displaced.     Abdomen: good bowel sounds.  There is no guarding or rebound.  There is no hepatosplenomegaly or tenderness.  There are no masses.   Extremities:  Bilateral leg edema, - left greater than right.  The legs are without rashes.  The distal pulses are intact.   Neuro:  Cranial nerves II - XII are intact.  Motor and sensory functions are intact.    The gait is  normal.  ECG: Sinus bradycardia  Assessment / Plan:

## 2010-09-24 NOTE — Assessment & Plan Note (Signed)
Patient has had nonsustained ventricular tachycardia. She recently had some episodes of palpitations. She denies any syncope or presyncope. I don't think that her episodes of palpitations when necessary due to nonsustained ventricular tachycardia. We'll continue to treat her with her same medications.

## 2010-09-26 ENCOUNTER — Telehealth: Payer: Self-pay | Admitting: *Deleted

## 2010-09-26 LAB — LIPID PANEL
Cholesterol: 137 mg/dL (ref 0–200)
Triglycerides: 63 mg/dL (ref 0.0–149.0)
VLDL: 12.6 mg/dL (ref 0.0–40.0)

## 2010-09-26 NOTE — Telephone Encounter (Signed)
Message copied by Antony Odea on Thu Sep 26, 2010 10:03 AM ------      Message from: Vesta Mixer      Created: Wed Sep 25, 2010  6:06 PM       [facility]

## 2010-09-26 NOTE — Telephone Encounter (Signed)
No cholesterol was ordered, mistake on our part. [facility] lab called and test ordered. Pt called with other lab results and explained the cholesterol results will follow later.Pt verbalized understanding. Alfonso Ramus RN

## 2010-09-27 ENCOUNTER — Telehealth: Payer: Self-pay | Admitting: *Deleted

## 2010-09-27 NOTE — Telephone Encounter (Signed)
msg left with cholesterol results, to call back with further questions

## 2010-09-30 ENCOUNTER — Ambulatory Visit
Admission: RE | Admit: 2010-09-30 | Discharge: 2010-09-30 | Disposition: A | Discharge status: Home / Self Care | Payer: 59 | Source: Ambulatory Visit | Attending: Rheumatology | Admitting: Rheumatology

## 2010-09-30 ENCOUNTER — Other Ambulatory Visit: Payer: Self-pay | Admitting: Rheumatology

## 2010-09-30 ENCOUNTER — Other Ambulatory Visit: Payer: Self-pay | Admitting: Orthopedic Surgery

## 2010-09-30 DIAGNOSIS — M1990 Unspecified osteoarthritis, unspecified site: Secondary | ICD-10-CM

## 2010-10-04 ENCOUNTER — Telehealth: Payer: Self-pay | Admitting: Cardiovascular Disease

## 2010-10-04 NOTE — Telephone Encounter (Signed)
Insurance will be terminated August 31st.  Pt would like to see if she can reschedule her Sept 9th appts for an earlier date due to being laid off.  Chart in box.  Please call pt back.

## 2010-10-04 NOTE — Telephone Encounter (Signed)
Made app at [facility]/ [facility] unavailability to do sooner.

## 2010-10-10 ENCOUNTER — Ambulatory Visit (HOSPITAL_COMMUNITY)
Admission: RE | Admit: 2010-10-10 | Discharge: 2010-10-10 | Disposition: A | Discharge status: Home / Self Care | Payer: 59 | Source: Ambulatory Visit | Attending: Cardiovascular Disease | Admitting: Cardiovascular Disease

## 2010-10-10 DIAGNOSIS — M79609 Pain in unspecified limb: Secondary | ICD-10-CM | POA: Insufficient documentation

## 2010-10-10 DIAGNOSIS — I509 Heart failure, unspecified: Secondary | ICD-10-CM

## 2010-10-10 DIAGNOSIS — M7989 Other specified soft tissue disorders: Secondary | ICD-10-CM | POA: Insufficient documentation

## 2010-10-10 DIAGNOSIS — R002 Palpitations: Secondary | ICD-10-CM

## 2010-10-10 DIAGNOSIS — R600 Localized edema: Secondary | ICD-10-CM

## 2010-10-18 ENCOUNTER — Encounter: Payer: Self-pay | Admitting: *Deleted

## 2010-10-18 ENCOUNTER — Other Ambulatory Visit (HOSPITAL_COMMUNITY): Payer: Self-pay | Admitting: Radiology

## 2010-12-27 ENCOUNTER — Ambulatory Visit (INDEPENDENT_AMBULATORY_CARE_PROVIDER_SITE_OTHER): Payer: 59 | Admitting: Cardiovascular Disease

## 2010-12-27 ENCOUNTER — Encounter: Payer: Self-pay | Admitting: Cardiovascular Disease

## 2010-12-27 ENCOUNTER — Other Ambulatory Visit: Payer: Self-pay | Admitting: Cardiovascular Disease

## 2010-12-27 VITALS — BP 134/82 | HR 64 | Ht 62.0 in | Wt 164.1 lb

## 2010-12-27 DIAGNOSIS — I4729 Other ventricular tachycardia: Secondary | ICD-10-CM

## 2010-12-27 DIAGNOSIS — I10 Essential (primary) hypertension: Secondary | ICD-10-CM

## 2010-12-27 DIAGNOSIS — I472 Ventricular tachycardia, unspecified: Secondary | ICD-10-CM

## 2010-12-27 DIAGNOSIS — E785 Hyperlipidemia, unspecified: Secondary | ICD-10-CM

## 2010-12-27 MED ORDER — HYDROCHLOROTHIAZIDE 25 MG PO TABS: 25.0000 mg | ORAL_TABLET | Freq: Every day | ORAL | Status: DC

## 2010-12-27 MED ORDER — POTASSIUM CHLORIDE CRYS ER 10 MEQ PO TBCR: 10.0000 meq | EXTENDED_RELEASE_TABLET | Freq: Every day | ORAL | Status: DC

## 2010-12-27 NOTE — Patient Instructions (Signed)
Your physician recommends that you schedule a follow-up appointment in: 3 month office app made.  Your physician recommends that you return for lab work in: 3 months  Your physician has recommended you make the following change in your medication:   1) increase HCTZ to 25mg  daily  2) start potassium 10 meq one tablet daily

## 2010-12-27 NOTE — Progress Notes (Signed)
Bree Heinzelman Saxton Date of Birth  December 29, 1945  [facility] [address]    [address] [place], [place]  [address] [phone]  Fax  [phone]  History of Present Illness:  Sheila Reynolds is a 65 y.o. female with a hx of chest pain, nonsustained VT, hyperlipidemia and mild - moderate CAD.  She also has a history of sarcoidosis. She apparently is having some problems with her sarcoid at this time. She's been put on prednisone. This has caused her to retained some fluid. She has HCTZ to take but she doesn't like to take it because it causes her to have leg cramps. She does not have potassium to take with her HCTZ.  Current Outpatient Prescriptions on File Prior to Visit  Medication Sig Dispense Refill  . ALPRAZolam (XANAX) 1 MG tablet Take 1 mg by mouth as needed.        [name] aspirin 81 MG tablet Take 81 mg by mouth daily.        [name] atorvastatin (LIPITOR) 20 MG tablet Take 20 mg by mouth daily.        . brinzolamide (AZOPT) 1 % ophthalmic suspension 1 drop 3 (three) times daily.       . hydrochlorothiazide 25 MG tablet Take 25 mg by mouth. Taking 3 Times a Week      . metoprolol (TOPROL-XL) 50 MG 24 hr tablet Take 50 mg by mouth daily. 50 mg in the morning and 25 mg at night       . Travoprost (TRAVATAN OP) Apply to eye daily.          Allergies  Allergen Reactions  . Crestor (Rosuvastatin Calcium) Other (See Comments)    She is intolerant to crestor which caused muscle aches    Past Medical History  Diagnosis Date  . Hyperlipidemia   . Chest pain   . Nonsustained ventricular tachycardia   . Fibromyalgia   . Coronary artery disease     Mild to moderate CAD  . Palpitations     Past Surgical History  Procedure Date  . Cardiac catheterization 05/04/2007    reveals overall normal left ventricular systolic function. Ejection fraction 65-70%    History  Smoking status  . Former Smoker  Smokeless tobacco  . Not on file    History  Alcohol Use No    No family history on  file.  Reviw of Systems:  Reviewed in the HPI.  All other systems are negative.  Physical Exam: BP 134/82  Pulse 64  Ht 5\' 2"  (1.575 m)  Wt 164 lb 1.9 oz (74.444 kg)  BMI 30.02 kg/m2 The patient is alert and oriented x 3.  The mood and affect are normal.   Skin: warm and dry.  Color is normal.    HEENT:   Normocephalic/atraumatic. Her carotids are normal. There is no JVP or  Lungs: Lung exam is clear.   Heart: Regular S1-S2. He has no murmurs    Abdomen: His good bowel sounds. There is no hepatosplenomegaly.  Extremities:  No palpable cords. No clubbing cyanosis or edema to  Neuro:  Nonfocal     Assessment / Plan:

## 2010-12-27 NOTE — Assessment & Plan Note (Signed)
We'll continue with her current lipid medications. We'll check her levels again in 6 months.

## 2010-12-27 NOTE — Assessment & Plan Note (Signed)
She describes some palpitations that are most likely premature ventricular contractions or perhaps brief runs of nonsustained ventricular tachycardia.  She also describes having some leg cramps. I suspect that she may have some hypokalemia. Will add potassium to her prescription. We'll also increase her HCTZ which she supposed to be taking every day

## 2010-12-30 ENCOUNTER — Other Ambulatory Visit: Payer: Self-pay | Admitting: *Deleted

## 2010-12-30 MED ORDER — ATORVASTATIN CALCIUM 20 MG PO TABS: 20.0000 mg | ORAL_TABLET | Freq: Every day | ORAL | Status: DC

## 2010-12-30 NOTE — Telephone Encounter (Signed)
Fax Received. Refill Completed. Sheila Reynolds (M.A)  

## 2011-01-07 ENCOUNTER — Encounter (INDEPENDENT_AMBULATORY_CARE_PROVIDER_SITE_OTHER): Payer: 59 | Admitting: Internal Medicine

## 2011-01-07 DIAGNOSIS — R0602 Shortness of breath: Secondary | ICD-10-CM

## 2011-01-07 LAB — PULMONARY FUNCTION TEST

## 2011-01-13 ENCOUNTER — Encounter: Payer: Self-pay | Admitting: Rheumatology

## 2011-02-05 ENCOUNTER — Ambulatory Visit (INDEPENDENT_AMBULATORY_CARE_PROVIDER_SITE_OTHER): Payer: 59 | Admitting: Internal Medicine

## 2011-02-05 ENCOUNTER — Encounter: Payer: Self-pay | Admitting: Internal Medicine

## 2011-02-05 VITALS — BP 138/72 | HR 90 | Ht 62.0 in | Wt 163.4 lb

## 2011-02-05 DIAGNOSIS — D869 Sarcoidosis, unspecified: Secondary | ICD-10-CM

## 2011-02-05 DIAGNOSIS — J441 Chronic obstructive pulmonary disease with (acute) exacerbation: Secondary | ICD-10-CM | POA: Insufficient documentation

## 2011-02-05 DIAGNOSIS — J40 Bronchitis, not specified as acute or chronic: Secondary | ICD-10-CM

## 2011-02-05 MED ORDER — METHYLPREDNISOLONE ACETATE 80 MG/ML IJ SUSP
80.0000 mg | Freq: Once | INTRAMUSCULAR | Status: AC
  Administered 2011-02-05: 80 mg via INTRAMUSCULAR

## 2011-02-05 MED ORDER — LEVALBUTEROL HCL 0.63 MG/3ML IN NEBU
0.6300 mg | INHALATION_SOLUTION | Freq: Once | RESPIRATORY_TRACT | Status: AC
  Administered 2011-02-05: 0.63 mg via RESPIRATORY_TRACT

## 2011-02-05 NOTE — Progress Notes (Signed)
02/05/11- 68 yoF former smoker seen on referral from Dr Corliss Skains for concern of sarcoid. Sarcoid was diagnosed by a series of ophthalmologists at [facility] and elsewhere, 20 years ago. 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 Shortness of breath has been chronic with one flight of stairs. She thinks she may be gradually getting worse. There is chronic dry cough which is been worse since a flulike illness 3 weeks ago. 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 I can't tell a specific arthritis diagnosis has been made. There is no history of liver or kidney disease. Bronchitis was diagnosed at the time of allergy skin testing a year ago. Food sensitivity to shrimp and crab which make her lips itch. Positive skin test also for grass and house dust. She has not been on allergy vaccine. A rescue inhaler has helped at times. 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 Advair was no help and Symbicort may have helped only a little. ACE 46 (8-52) 09/30/10 Medication choices been limited by glaucoma. No history of TB exposure. Past history of sinus infections. Active problem with heartburn which is partly suppressed with Nexium once or twice daily/ Dr Loreta Ave. Married, now on short-term disability. Quit smoking 1980 Several family members on her father's side had sarcoid.  ROS-see HPI Constitutional:   No-   weight loss, night sweats, fevers, chills, fatigue, lassitude. HEENT:   No-  headaches, difficulty swallowing, tooth/dental problems, sore throat,       Some  sneezing, itching, ear ache, nasal congestion, post nasal drip,  CV:  No-   chest pain, orthopnea, PND, swelling in lower extremities, anasarca, dizziness, palpitations Resp: +  shortness of breath with exertion not at rest.              No-   productive cough,  No non-productive cough,  No- coughing up of blood.              No-    change in color of mucus.  No- wheezing.   Skin: No-   rash or lesions. GI:  No-   heartburn, indigestion, abdominal pain, nausea, vomiting, diarrhea,                 change in bowel habits, loss of appetite GU:  MS:  +  joint pain or swelling, decreased range of motion,  back pain. Neuro-     nothing unusual Psych:  No- change in mood or affect. No depression or anxiety.  No memory loss.  OBJ General- Alert, Oriented, Affect-appropriate, Distress- none acute Skin- rash-none, lesions- none, excoriation- none Lymphadenopathy- none Head- atraumatic            Eyes- Gross vision intact, PERRLA, conjunctivae clear secretions- not injected            Ears- Hearing aid Left            Nose- Clear, no-Septal dev, mucus, polyps, erosion, perforation             Throat- Mallampati II , mucosa clear , drainage- none, tonsils- atrophic Neck- flexible , trachea midline, no stridor , thyroid nl, carotid no bruit Chest - symmetrical excursion , unlabored           Heart/CV- RRR , no murmur , no gallop  , no rub, nl s1 s2                           -  JVD- none , edema- none, stasis changes- none, varices- none           Lung- unlabored mild cough and wheeze , dullness-none, rub- none           Chest wall-  Abd- tender-no, distended-no, bowel sounds-present, HSM- no Br/ Gen/ Rectal- Not done, not indicated Extrem- cyanosis- none, clubbing, none, atrophy- none, strength- nl Neuro- grossly intact to observation

## 2011-02-05 NOTE — Patient Instructions (Signed)
Neb Xop 0.63  Depo 80  Sample Dulera 200     2 puffs then rinse mouth, twice daily

## 2011-02-08 DIAGNOSIS — D869 Sarcoidosis, unspecified: Secondary | ICD-10-CM | POA: Insufficient documentation

## 2011-02-08 NOTE — Assessment & Plan Note (Signed)
Methotrexate and prednisone did not specifically help her uveitis and arthritis. If they're going to be used anyway then it may not be critical to establish the diagnosis.  Her pulmonary function tests don't show significant response to bronchodilator so it isn't surprising that she has not found clinical response. I would try Spiriva but her glaucoma makes them prohibitive.

## 2011-02-08 NOTE — Assessment & Plan Note (Signed)
Mild reversible obstructive airways disease component seen in small airway flows. This may be a useful target for additional trial with bronchodilators.

## 2011-02-09 ENCOUNTER — Encounter (HOSPITAL_BASED_OUTPATIENT_CLINIC_OR_DEPARTMENT_OTHER): Payer: Self-pay

## 2011-02-09 ENCOUNTER — Emergency Department (HOSPITAL_BASED_OUTPATIENT_CLINIC_OR_DEPARTMENT_OTHER)
Admission: EM | Admit: 2011-02-09 | Discharge: 2011-02-09 | Disposition: A | Discharge status: Home / Self Care | Payer: 59 | Attending: Emergency Medicine | Admitting: Emergency Medicine

## 2011-02-09 ENCOUNTER — Emergency Department (INDEPENDENT_AMBULATORY_CARE_PROVIDER_SITE_OTHER): Payer: 59

## 2011-02-09 DIAGNOSIS — E785 Hyperlipidemia, unspecified: Secondary | ICD-10-CM | POA: Insufficient documentation

## 2011-02-09 DIAGNOSIS — R059 Cough, unspecified: Secondary | ICD-10-CM | POA: Insufficient documentation

## 2011-02-09 DIAGNOSIS — Z79899 Other long term (current) drug therapy: Secondary | ICD-10-CM | POA: Insufficient documentation

## 2011-02-09 DIAGNOSIS — Z7982 Long term (current) use of aspirin: Secondary | ICD-10-CM | POA: Insufficient documentation

## 2011-02-09 DIAGNOSIS — R109 Unspecified abdominal pain: Secondary | ICD-10-CM | POA: Insufficient documentation

## 2011-02-09 DIAGNOSIS — R0989 Other specified symptoms and signs involving the circulatory and respiratory systems: Secondary | ICD-10-CM

## 2011-02-09 DIAGNOSIS — I2510 Atherosclerotic heart disease of native coronary artery without angina pectoris: Secondary | ICD-10-CM | POA: Insufficient documentation

## 2011-02-09 DIAGNOSIS — R079 Chest pain, unspecified: Secondary | ICD-10-CM | POA: Insufficient documentation

## 2011-02-09 DIAGNOSIS — M791 Myalgia, unspecified site: Secondary | ICD-10-CM

## 2011-02-09 DIAGNOSIS — R05 Cough: Secondary | ICD-10-CM | POA: Insufficient documentation

## 2011-02-09 DIAGNOSIS — IMO0001 Reserved for inherently not codable concepts without codable children: Secondary | ICD-10-CM | POA: Insufficient documentation

## 2011-02-09 DIAGNOSIS — M542 Cervicalgia: Secondary | ICD-10-CM | POA: Insufficient documentation

## 2011-02-09 LAB — DIFFERENTIAL
Basophils Absolute: 0 10*3/uL (ref 0.0–0.1)
Eosinophils Absolute: 0 10*3/uL (ref 0.0–0.7)
Lymphocytes Relative: 20 % (ref 12–46)
Lymphs Abs: 2.2 10*3/uL (ref 0.7–4.0)
Neutrophils Relative %: 72 % (ref 43–77)

## 2011-02-09 LAB — URINALYSIS, ROUTINE W REFLEX MICROSCOPIC
Hgb urine dipstick: NEGATIVE
Leukocytes, UA: NEGATIVE
Nitrite: NEGATIVE
Protein, ur: NEGATIVE mg/dL
Specific Gravity, Urine: 1.017 (ref 1.005–1.030)
Urobilinogen, UA: 1 mg/dL (ref 0.0–1.0)

## 2011-02-09 LAB — TROPONIN I: Troponin I: <0.3 ng/mL (ref <0.30)

## 2011-02-09 LAB — COMPREHENSIVE METABOLIC PANEL
ALT: 28 U/L (ref 0–35)
AST: 19 U/L (ref 0–37)
Alkaline Phosphatase: 101 U/L (ref 39–117)
Calcium: 9.5 mg/dL (ref 8.4–10.5)
GFR calc Af Amer: 89 mL/min — ABNORMAL LOW (ref >90)
Glucose, Bld: 98 mg/dL (ref 70–99)
Potassium: 4.1 mEq/L (ref 3.5–5.1)
Sodium: 137 mEq/L (ref 135–145)
Total Protein: 7.6 g/dL (ref 6.0–8.3)

## 2011-02-09 LAB — CBC
MCH: 29.3 pg (ref 26.0–34.0)
Platelets: 238 10*3/uL (ref 150–400)
RBC: 4.47 MIL/uL (ref 3.87–5.11)
RDW: 14.1 % (ref 11.5–15.5)
WBC: 11.1 10*3/uL — ABNORMAL HIGH (ref 4.0–10.5)

## 2011-02-09 LAB — CK TOTAL AND CKMB (NOT AT ARMC): Relative Index: INVALID (ref 0.0–2.5)

## 2011-02-09 LAB — LIPASE, BLOOD: Lipase: 46 U/L (ref 11–59)

## 2011-02-09 MED ORDER — SODIUM CHLORIDE 0.9 % IV BOLUS (SEPSIS)
1000.0000 mL | Freq: Once | INTRAVENOUS | Status: AC
  Administered 2011-02-09: 1000 mL via INTRAVENOUS

## 2011-02-09 MED ORDER — KETOROLAC TROMETHAMINE 30 MG/ML IJ SOLN
30.0000 mg | Freq: Once | INTRAMUSCULAR | Status: AC
  Administered 2011-02-09: 30 mg via INTRAVENOUS
  Filled 2011-02-09: qty 1

## 2011-02-09 MED ORDER — DIPHENHYDRAMINE HCL 50 MG/ML IJ SOLN
25.0000 mg | Freq: Once | INTRAMUSCULAR | Status: AC
  Administered 2011-02-09: 50 mg via INTRAVENOUS
  Filled 2011-02-09: qty 1

## 2011-02-09 MED ORDER — METHYLPREDNISOLONE SODIUM SUCC 125 MG IJ SOLR
125.0000 mg | Freq: Once | INTRAMUSCULAR | Status: AC
Start: 2011-02-09 — End: 2011-02-09
  Administered 2011-02-09: 125 mg via INTRAVENOUS
  Filled 2011-02-09: qty 2

## 2011-02-09 NOTE — ED Notes (Signed)
C/o hx of "body cramps" x "for years"-reports same started today 430pm

## 2011-02-09 NOTE — ED Provider Notes (Signed)
History    This chart was scribed for Hilario Quarry, MD, MD by Smitty Pluck. The patient was seen in room MH05 and the patient's care was started at 6:24PM.   CSN: [id number]  Arrival date & time 02/09/11  4098   First MD Initiated Contact with Patient 02/09/11 1816      Chief Complaint  Patient presents with  . Generalized Body Aches    (Consider location/radiation/quality/duration/timing/severity/associated sxs/prior treatment) The history is provided by the patient.   Sheila Reynolds is a 65 y.o. female who presents to the Emergency Department complaining of moderate body cramps that have worsened today. Pt reports they started in bilateral flanks and radiate all over body. She reports having these cramps for "years." Pain has been constant since onset. Pt denies hyperventilation. Pt reports having flu recently. Pt has history of fibromyalgia, sarcoidosis, diabetes and palpitations. Pt has taken pain medication for cramps without relief. Pt has a productive cough. She reports that she has not been drinking and eating like usual today.  PCP is Dr. Allyne Gee.  Past Medical History  Diagnosis Date  . Hyperlipidemia   . Chest pain   . Nonsustained ventricular tachycardia   . Fibromyalgia   . Coronary artery disease     Mild to moderate CAD  . Palpitations     Past Surgical History  Procedure Date  . Cardiac catheterization 05/04/2007    reveals overall normal left ventricular systolic function. Ejection fraction 65-70%  . Breast surgery approx 1992    bilateral breast surgery for fibrocystic breast disease  . Total abdominal hysterectomy 1982    Family History  Problem Relation Age of Onset  . Heart disease Father   . Heart disease Paternal Grandmother   . Cancer Mother     unknown type, ?lung  . Cancer Paternal Grandmother     unknown type  . Diabetes Father   . Diabetes Brother   . Glaucoma Father   . Glaucoma Brother     History  Substance Use Topics  .  Smoking status: Former Smoker -- 1.0 packs/day for 4 years    Types: Cigarettes    Quit date: 02/11/1980  . Smokeless tobacco: Not on file  . Alcohol Use: No    OB History    Grav Para Term Preterm Abortions TAB SAB Ect Mult Living                  Review of Systems  All other systems reviewed and are negative.   10 Systems reviewed and are negative for acute change except as noted in the HPI.  Allergies  Crestor  Home Medications   Current Outpatient Rx  Name Route Sig Dispense Refill  . ASPIRIN 81 MG PO TABS Oral Take 81 mg by mouth daily.      . ATORVASTATIN CALCIUM 20 MG PO TABS Oral Take 1 tablet (20 mg total) by mouth daily. 30 tablet 5  . BRINZOLAMIDE 1 % OP SUSP Both Eyes Place 1 drop into both eyes 4 (four) times daily.     . DEXLANSOPRAZOLE 60 MG PO CPDR Oral Take 60 mg by mouth daily.      [name] ESOMEPRAZOLE MAGNESIUM 40 MG PO CPDR Oral Take 40 mg by mouth daily.      [name] FOLIC ACID 1 MG PO TABS Oral Take 2 mg by mouth daily.      [name] HYDROCHLOROTHIAZIDE 25 MG PO TABS Oral Take 25 mg by mouth 3 (three) times a  week.     . METOPROLOL TARTRATE 50 MG PO TABS Oral Take 25 mg by mouth every evening.      [name] METOPROLOL TARTRATE 50 MG PO TABS Oral Take 50 mg by mouth every morning.      . MOMETASONE FURO-FORMOTEROL FUM 100-5 MCG/ACT IN AERO Inhalation Inhale 2 puffs into the lungs 2 (two) times daily.      [name] POTASSIUM CHLORIDE CRYS CR 10 MEQ PO TBCR Oral Take 1 tablet (10 mEq total) by mouth daily. 30 tablet 5  . PREDNISONE 5 MG PO TABS Oral Take 2.5 mg by mouth daily.      [name] SITAGLIPTIN PHOSPHATE 100 MG PO TABS Oral Take 100 mg by mouth daily.      . TRAMADOL HCL 50 MG PO TABS Oral Take 50 mg by mouth 3 (three) times daily as needed. For pain. Maximum dose= 8 tablets per day    . TRAVATAN OP Both Eyes Place 1 drop into both eyes at bedtime.     . ALPRAZOLAM 0.5 MG PO TABS  1/2 to 1 tab by mouth twice daily as needed     . HYDROCOD POLST-CHLORPHEN POLST 10-8 MG/5ML PO LQCR  1/2  tsp every 12 hours as needed cough    . METHOTREXATE (ANTI-RHEUMATIC) 2.5 MG PO TABS  Taking 8 Tablets Once a Week      BP 153/92  Pulse 93  Temp(Src) 98 F (36.7 C) (Oral)  Resp 20  Ht 5\' 2"  (1.575 m)  Wt 160 lb (72.576 kg)  BMI 29.26 kg/m2  SpO2 100%  Physical Exam  Nursing note and vitals reviewed. Constitutional: She is oriented to person, place, and time. She appears well-developed and well-nourished. No distress.  HENT:  Head: Normocephalic and atraumatic.  Eyes: EOM are normal. Pupils are equal, round, and reactive to light.  Neck: Normal range of motion. Neck supple. No tracheal deviation present.  Cardiovascular: Normal rate.   Pulmonary/Chest: Effort normal. No respiratory distress.  Abdominal: Soft. She exhibits no distension.  Musculoskeletal: Normal range of motion. She exhibits tenderness (neck).  Neurological: She is alert and oriented to person, place, and time.  Skin: Skin is warm and dry.  Psychiatric: She has a normal mood and affect. Her behavior is normal.    ED Course  Procedures (including critical care time)  DIAGNOSTIC STUDIES: Oxygen Saturation is 100% on room air, normal by my interpretation.    COORDINATION OF CARE:    Labs Reviewed  CBC - Abnormal; Notable for the following:    WBC 11.1 (*)    All other components within normal limits  DIFFERENTIAL - Abnormal; Notable for the following:    Neutro Abs 8.0 (*)    All other components within normal limits  URINALYSIS, ROUTINE W REFLEX MICROSCOPIC  COMPREHENSIVE METABOLIC PANEL  CK TOTAL AND CKMB  TROPONIN I  LIPASE, BLOOD   Dg Chest 2 View  02/09/2011  *RADIOLOGY REPORT*  Clinical Data: Chest pain with cough and congestion.  CHEST - 2 VIEW  Comparison: 09/30/2010 and 04/16/2010.  Findings: The heart size and mediastinal contours are normal. The lungs are clear. There is no pleural effusion or pneumothorax. No acute osseous findings are identified.  Prominent nipple shadow is noted on  the left.  There is a stable nodular density superimposed over the lower thoracic spine on the lateral view, possibly a bone island.  IMPRESSION: Stable examination.  No active cardiopulmonary process.  Original Report Authenticated By: Gerrianne Scale, M.D.  No diagnosis found.    MDM    Patient improved after iv fluids.  No signs of rhabdo or electrolyte abnormality.   I personally performed the services described in this documentation, which was scribed in my presence. The recorded information has been reviewed and considered.    Hilario Quarry, MD 02/09/11 [phone]

## 2011-02-09 NOTE — ED Notes (Signed)
Patient transported to X-ray, IV site intact, NAD noted.

## 2011-03-10 ENCOUNTER — Ambulatory Visit (INDEPENDENT_AMBULATORY_CARE_PROVIDER_SITE_OTHER): Payer: 59 | Admitting: Internal Medicine

## 2011-03-10 ENCOUNTER — Encounter: Payer: Self-pay | Admitting: Internal Medicine

## 2011-03-10 VITALS — BP 140/70 | HR 83 | Ht 62.0 in | Wt 164.8 lb

## 2011-03-10 DIAGNOSIS — J40 Bronchitis, not specified as acute or chronic: Secondary | ICD-10-CM

## 2011-03-10 DIAGNOSIS — D869 Sarcoidosis, unspecified: Secondary | ICD-10-CM

## 2011-03-10 MED ORDER — BENZONATATE 200 MG PO CAPS: 200.0000 mg | ORAL_CAPSULE | Freq: Three times a day (TID) | ORAL | Status: AC | PRN

## 2011-03-10 NOTE — Progress Notes (Signed)
02/05/11- 65 yoF former smoker seen on referral from Dr Corliss Skains for concern of sarcoid. Sarcoid was diagnosed by a series of ophthalmologists at [facility] and elsewhere, 20 years ago. 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 Shortness of breath has been chronic with one flight of stairs. She thinks she may be gradually getting worse. There is chronic dry cough which is been worse since a flulike illness 3 weeks ago. 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 I can't tell a specific arthritis diagnosis has been made. There is no history of liver or kidney disease. Bronchitis was diagnosed at the time of allergy skin testing a year ago. Food sensitivity to shrimp and crab which make her lips itch. Positive skin test also for grass and house dust. She has not been on allergy vaccine. A rescue inhaler has helped at times. 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 Advair was no help and Symbicort may have helped only a little. ACE 46 (8-52) 09/30/10 Medication choices been limited by glaucoma. No history of TB exposure. Past history of sinus infections. Active problem with heartburn which is partly suppressed with Nexium once or twice daily/ Dr Loreta Ave. Married, now on short-term disability. Quit smoking 1980 Several family members on her father's side had sarcoid.  03/10/11- 37 yoF former smoker followed for bronchitis, hx sarcoid, complicated by GERD, glaucoma Dulera inhaler did not have significant improvement. She is off of methotrexate and prednisone as of this month. 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 She occasionally wakes coughing with thick white mucus, stress incontinence. Just occasionally tight. DOE with one flight of stairs and has to stop at the top to get her breath but  no longer has to catch her breath halfway down stairs carrying laundry. Tussionex no longer works as well for cough as it used to.  ROS-see HPI Constitutional:   No-   weight loss, night sweats, fevers, chills, fatigue, lassitude. HEENT:   No-  headaches, difficulty swallowing, tooth/dental problems, sore throat,       Some  sneezing, itching, ear ache, nasal congestion, post nasal drip,  CV:  No-   chest pain, orthopnea, PND, swelling in lower extremities, anasarca, dizziness, palpitations Resp: +  shortness of breath with exertion not at rest.              +   productive cough,  No non-productive cough,  No- coughing up of blood.              No-   change in color of mucus.  No- wheezing.   Skin: No-   rash or lesions. GI:  No-   heartburn, indigestion, abdominal pain, nausea, vomiting, diarrhea,                 change in bowel habits, loss of appetite GU:  MS:  +  joint pain or swelling, decreased range of motion,  back pain. Neuro-     nothing unusual Psych:  No- change in mood or affect. No depression or anxiety.  No memory loss.  OBJ General- Alert, Oriented, Affect-appropriate, Distress- none acute Skin- rash-none, lesions- none, excoriation- none Lymphadenopathy- none Head- atraumatic            Eyes- Gross vision intact, PERRLA, conjunctivae clear secretions- not injected  Ears- Hearing aid Left            Nose- Clear, no-Septal dev, mucus, polyps, erosion, perforation             Throat- Mallampati II , mucosa clear , drainage- none, tonsils- atrophic Neck- flexible , trachea midline, no stridor , thyroid nl, carotid no bruit Chest - symmetrical excursion , unlabored           Heart/CV- RRR , no murmur , no gallop  , no rub, nl s1 s2                           - JVD- none , edema- none, stasis changes- none, varices- none           Lung- unlabored mild cough and wheeze , dullness-none, rub- none           Chest wall-  Abd-  Br/ Gen/ Rectal- Not done, not  indicated Extrem- cyanosis- none, clubbing, none, atrophy- none, strength- nl Neuro- grossly intact to observation

## 2011-03-10 NOTE — Patient Instructions (Signed)
Script  Sent  to try benzonatate 200 mg,   For cough as needed

## 2011-03-11 NOTE — Assessment & Plan Note (Signed)
She did not see benefit from maintenance therapy with Dulera 200. At this time of year it can be difficult to sort out what may have been a resolving viral bronchitis. Some of her dyspnea on stairs may reflect steroid-related thigh muscle weakness so hopefully will improve. Comparison PFT and chest x-ray can be done as needed. For short term, we will try benzonatate Perles.

## 2011-03-11 NOTE — Assessment & Plan Note (Signed)
It will be useful to see if her ACE level climbs off of methotrexate plus prednisone. She blames those medicines for weight gain and hair loss, will stay off.

## 2011-03-31 ENCOUNTER — Encounter: Payer: Self-pay | Admitting: Cardiovascular Disease

## 2011-03-31 ENCOUNTER — Ambulatory Visit (INDEPENDENT_AMBULATORY_CARE_PROVIDER_SITE_OTHER): Payer: 59 | Admitting: Cardiovascular Disease

## 2011-03-31 DIAGNOSIS — I472 Ventricular tachycardia: Secondary | ICD-10-CM

## 2011-03-31 DIAGNOSIS — R0609 Other forms of dyspnea: Secondary | ICD-10-CM

## 2011-03-31 DIAGNOSIS — I4729 Other ventricular tachycardia: Secondary | ICD-10-CM

## 2011-03-31 DIAGNOSIS — R0600 Dyspnea, unspecified: Secondary | ICD-10-CM

## 2011-03-31 DIAGNOSIS — R079 Chest pain, unspecified: Secondary | ICD-10-CM

## 2011-03-31 DIAGNOSIS — R0989 Other specified symptoms and signs involving the circulatory and respiratory systems: Secondary | ICD-10-CM

## 2011-03-31 NOTE — Assessment & Plan Note (Signed)
Sheila Reynolds has continued to have some palpitations. Has a history of nonsustained ventricular tachycardia while on the treadmill. He's not had any episodes of syncope or presyncope.  We'll continue with her same medications. I'll see her again in 6 months.

## 2011-03-31 NOTE — Progress Notes (Signed)
Sheila Reynolds Date of Birth  02/21/1945 [id number] [facility] Office  [address]    [address]   [address] [place], [place]  [address]    [place], [place]  [address] [phone]  Fax  [phone]  [phone]  Fax [phone]  Problem list: 1. Hyperlipidemia 2. Chest pain 3. Nonsustained ventricular tachycardia 4. Mild to moderate coronary artery disease by cath in 2009. 5. 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 Fannie Knee  History of Present Illness:  Sheila Reynolds is a 66 y.o. female with the above noted hx.  She has continued to have some palpitations.  She has occasional episodes of lightheadedness when she has palpitations. Her main issue has been lots of total body cramps. She has been seen in the emergency room. Her lab work looked fine.  She's had lots of dyspnea especially with exertion. She has a history of sarcoidosis and is followed by Dr. Maple Hudson.  Current Outpatient Prescriptions on File Prior to Visit  Medication Sig Dispense Refill  . ALPRAZolam (XANAX) 0.5 MG tablet Take 0.25 mg by mouth at bedtime as needed. For sleep      . Ascorbic Acid (VITAMIN C) 1000 MG tablet Take 1,000 mg by mouth daily.        [name] aspirin 81 MG tablet Take 81 mg by mouth daily.        [name] atorvastatin (LIPITOR) 20 MG tablet Take 1 tablet (20 mg total) by mouth daily.  30 tablet  5  . brinzolamide (AZOPT) 1 % ophthalmic suspension Place 1 drop into both eyes 4 (four) times daily.       . chlorpheniramine-HYDROcodone (TUSSIONEX) 10-8 MG/5ML LQCR Take 2.5 mLs by mouth every 12 (twelve) hours. For cough.      . esomeprazole (NEXIUM) 40 MG capsule Take 40 mg by mouth daily.        . hydrochlorothiazide (HYDRODIURIL) 25 MG tablet Take 25 mg by mouth 3 (three) times a week.       . metoprolol (LOPRESSOR) 50 MG tablet Take 50 mg by mouth every morning.        . mometasone-formoterol (DULERA) 100-5 MCG/ACT AERO Inhale 2 puffs into the lungs 2 (two) times daily.        . potassium  chloride (K-DUR,KLOR-CON) 10 MEQ tablet Take 1 tablet (10 mEq total) by mouth daily.  30 tablet  5  . sitaGLIPtin (JANUVIA) 100 MG tablet Take 100 mg by mouth daily.        . traMADol (ULTRAM) 50 MG tablet Take 50 mg by mouth 3 (three) times daily as needed. For pain. Maximum dose= 8 tablets per day      . Travoprost (TRAVATAN OP) Place 1 drop into both eyes at bedtime.         Allergies  Allergen Reactions  . Crestor (Rosuvastatin Calcium) Other (See Comments)    muscle aches    Past Medical History  Diagnosis Date  . Hyperlipidemia   . Chest pain   . Nonsustained ventricular tachycardia   . Fibromyalgia   . Coronary artery disease     Mild to moderate CAD  . Palpitations     Past Surgical History  Procedure Date  . Cardiac catheterization 05/04/2007    reveals overall normal left ventricular systolic function. Ejection fraction 65-70%  . Breast surgery approx 1992    bilateral breast surgery for fibrocystic breast disease  . Total abdominal hysterectomy 1982    History  Smoking status  . Former Smoker -- 1.0 packs/day for 4 years  . Types: Cigarettes  . Quit date: 02/11/1980  Smokeless tobacco  . Not on file    History  Alcohol Use No    Family History  Problem Relation Age of Onset  . Heart disease Father   . Heart disease Paternal Grandmother   . Cancer Mother     unknown type, ?lung  . Cancer Paternal Grandmother     unknown type  . Diabetes Father   . Diabetes Brother   . Glaucoma Father   . Glaucoma Brother     Reviw of Systems:  Reviewed in the HPI.  All other systems are negative.  Physical Exam: Blood pressure 129/82, pulse 76, height 5\' 2"  (1.575 m), weight 162 lb 12.8 oz (73.846 kg), SpO2 98.00%. General: Well developed, well nourished, in no acute distress.  Head: Normocephalic, atraumatic, sclera non-icteric, mucus membranes are moist,   Neck: Supple. Negative for carotid bruits. JVD not elevated.  Lungs: Clear bilaterally to  auscultation without wheezes, rales, or rhonchi. Breathing is unlabored.  Heart: RRR with S1 S2. No murmurs, rubs, or gallops appreciated.  Abdomen: Soft, non-tender, non-distended with normoactive bowel sounds. No hepatomegaly. No rebound/guarding. No obvious abdominal masses.  Msk:  Strength and tone appear normal for age.  Extremities: No clubbing or cyanosis. No edema.  Distal pedal pulses are 2+ and equal bilaterally.  Neuro: Alert and oriented X 3. Moves all extremities spontaneously.  Psych:  Responds to questions appropriately with a normal affect.  ECG:  Assessment / Plan:

## 2011-03-31 NOTE — Assessment & Plan Note (Signed)
Sheila Reynolds continues to have some shortness of breath particularly with exertion. She has normal coronary arteries by heart catheterization.  His quite possible that the shortness of breath is due to her sarcoidosis. Her ejection fraction remains normal.

## 2011-03-31 NOTE — Patient Instructions (Signed)
Your physician wants you to follow-up in:  6 months. You will receive a reminder letter in the mail two months in advance. If you don't receive a letter, please call our office to schedule the follow-up appointment.   

## 2011-06-24 ENCOUNTER — Other Ambulatory Visit: Payer: Self-pay

## 2011-06-24 MED ORDER — METOPROLOL TARTRATE 50 MG PO TABS: 50.0000 mg | ORAL_TABLET | ORAL | Status: DC

## 2011-06-24 NOTE — Telephone Encounter (Signed)
..   Requested Prescriptions   Signed Prescriptions Disp Refills  . metoprolol (LOPRESSOR) 50 MG tablet 30 tablet 6    Sig: Take 1 tablet (50 mg total) by mouth every morning.    Authorizing Provider: Vesta Mixer    Ordering User: Christella Hartigan, Latifah Padin Judie Petit

## 2011-07-02 ENCOUNTER — Telehealth: Payer: Self-pay | Admitting: Internal Medicine

## 2011-07-02 NOTE — Telephone Encounter (Signed)
I spoke with pt and she states she was on methotrexate and Dr. Corliss Skains stopped this bc her wbc was elevated and she had gotten sick. Dr. Corliss Skains wants pt to go back on this but wants pt to see CDY first to make sure she is okay to do so. Pt is needing a sooner apt than whats available. Please advise Katie and Dr. Maple Hudson, thanks

## 2011-07-02 NOTE — Telephone Encounter (Signed)
lmomtcb x1 

## 2011-07-03 NOTE — Telephone Encounter (Signed)
Pt returned call.  Set appt w/ CY for 5/28 at 10:30 w/ pt arriving at 10:15 am for the appointment.  Pt stated nothing further needed at this time.  Antionette Fairy

## 2011-07-03 NOTE — Telephone Encounter (Signed)
LMTCB-patient can be put on Tuesday 07-08-11 at 945am or 1015am(both slots held in case).

## 2011-07-08 ENCOUNTER — Ambulatory Visit (INDEPENDENT_AMBULATORY_CARE_PROVIDER_SITE_OTHER): Payer: 59 | Admitting: Internal Medicine

## 2011-07-08 ENCOUNTER — Encounter: Payer: Self-pay | Admitting: Internal Medicine

## 2011-07-08 ENCOUNTER — Other Ambulatory Visit: Payer: 59

## 2011-07-08 ENCOUNTER — Other Ambulatory Visit: Payer: Self-pay | Admitting: *Deleted

## 2011-07-08 ENCOUNTER — Ambulatory Visit (INDEPENDENT_AMBULATORY_CARE_PROVIDER_SITE_OTHER)
Admission: RE | Admit: 2011-07-08 | Discharge: 2011-07-08 | Disposition: A | Discharge status: Home / Self Care | Payer: 59 | Source: Ambulatory Visit | Attending: Internal Medicine | Admitting: Internal Medicine

## 2011-07-08 VITALS — BP 124/66 | HR 72 | Temp 98.1°F | Ht 62.0 in | Wt 159.6 lb

## 2011-07-08 DIAGNOSIS — R059 Cough, unspecified: Secondary | ICD-10-CM

## 2011-07-08 DIAGNOSIS — D869 Sarcoidosis, unspecified: Secondary | ICD-10-CM

## 2011-07-08 DIAGNOSIS — K219 Gastro-esophageal reflux disease without esophagitis: Secondary | ICD-10-CM

## 2011-07-08 DIAGNOSIS — R053 Chronic cough: Secondary | ICD-10-CM

## 2011-07-08 DIAGNOSIS — R05 Cough: Secondary | ICD-10-CM

## 2011-07-08 MED ORDER — ATORVASTATIN CALCIUM 20 MG PO TABS: 20.0000 mg | ORAL_TABLET | Freq: Every day | ORAL | Status: DC

## 2011-07-08 MED ORDER — PROMETHAZINE-CODEINE 6.25-10 MG/5ML PO SYRP: 5.0000 mL | ORAL_SOLUTION | Freq: Four times a day (QID) | ORAL | Status: DC | PRN

## 2011-07-08 NOTE — Progress Notes (Signed)
02/05/11- 71 yoF former smoker seen on referral from Dr Corliss Skains for concern of sarcoid. 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 Shortness of breath has been chronic with one flight of stairs. She thinks she may be gradually getting worse. There is chronic dry cough which is been worse since a flulike illness 3 weeks ago. 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 I can't tell a specific arthritis diagnosis has been made. There is no history of liver or kidney disease. Bronchitis was diagnosed at the time of allergy skin testing a year ago. Food sensitivity to shrimp and crab which make her lips itch. Positive skin test also for grass and house dust. She has not been on allergy vaccine. A rescue inhaler has helped at times. 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 Advair was no help and Symbicort may have helped only a little. ACE 46 (8-52) 09/30/10 Medication choices been limited by glaucoma. No history of TB exposure. Past history of sinus infections. Active problem with heartburn which is partly suppressed with Nexium once or twice daily/ Dr Loreta Ave. Married, now on short-term disability. Quit smoking 1980 Several family members on her father's side had sarcoid.  03/10/11- 74 yoF former smoker followed for bronchitis, hx sarcoid, complicated by GERD, glaucoma Dulera inhaler did not have significant improvement. She is off of methotrexate and prednisone as of this month. 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 She occasionally wakes coughing with thick white mucus, stress incontinence. Just occasionally tight. DOE with one flight of stairs and has to stop at the top to get her breath but  no longer has to catch her breath halfway down stairs carrying laundry. Tussionex no longer works as well for cough as it used to.  07/08/11- 65 yoF former smoker followed for bronchitis, hx sarcoid, complicated by GERD, glaucoma Iincreased cough and congestion(nasal and chest)Productive-green/yellow in color x 3 days. Denies any fever but has been having chills. SOB and wheezing along with cough. SOB is all the time. Need to check if okay to start back on Methtrexate again. Dr Corliss Skains has been following for sarcoid of the eye and wants to resume methotrexate. There has been increased cough, productive of  mucus, worse at night and supine. Maybe some chills. Stress incontinence. Arthralgias. Postnasal drip. GERD is fairly well controlled with Nexium. Taking tramadol now for pain so is not helping the cough. Failed benzonatate. This was a prolonged visit 40 minutes. CXR 02/09/11-images reviewed with her. IMPRESSION:  Stable examination. No active cardiopulmonary process.  Original Report Authenticated By: Gerrianne Scale, M.D.   ROS-see HPI Constitutional:   No-   weight loss, night sweats, fevers, +chills, fatigue, lassitude. HEENT:   No-  headaches, difficulty swallowing, tooth/dental problems, sore throat,       Some  sneezing, itching, ear ache, +nasal congestion, post nasal drip,  CV:  No-   chest pain, orthopnea, PND, swelling in lower extremities, anasarca, dizziness, palpitations Resp: +  shortness of breath with exertion not at rest.              +   productive cough,  No non-productive cough,  No- coughing up of blood.                +  change in color of mucus.  No- wheezing.   Skin: No-   rash or lesions. GI:  No-   heartburn, indigestion, abdominal pain, nausea, vomiting,  GU:  MS:  +  joint pain or swelling,  Neuro-     nothing unusual Psych:  No- change in mood or affect. No depression or anxiety.  No memory loss.  OBJ General- Alert, Oriented, Affect-appropriate, Distress-  none acute Skin- rash-none, lesions- none, excoriation- none Lymphadenopathy- none Head- atraumatic            Eyes- Gross vision intact, PERRLA, conjunctivae clear secretions- not injected            Ears- Hearing aid Left            Nose- sniffing, no-Septal dev, mucus, polyps, erosion, perforation             Throat- Mallampati II , mucosa clear , drainage- none, tonsils- atrophic Neck- flexible , trachea midline, no stridor , thyroid nl, carotid no bruit Chest - symmetrical excursion , unlabored           Heart/CV- RRR , no murmur , no gallop  , no rub, nl s1 s2                           - JVD- none , edema- none, stasis changes- none, varices- none           Lung- active dry cough, no wheeze , dullness-none, rub- none           Chest wall-  Abd-  Br/ Gen/ Rectal- Not done, not indicated Extrem- cyanosis- none, clubbing, none, atrophy- none, strength- nl Neuro- grossly intact to observation

## 2011-07-08 NOTE — Telephone Encounter (Signed)
Fax Received. Refill Completed. Connelly Spruell Chowoe (R.M.A)   

## 2011-07-08 NOTE — Patient Instructions (Addendum)
Ok to try taking your tramadol 2 tabs ( 100 mg) up to 3 times daily if needed for cough or pain  Consider trying an otc antihistamine like Claritin/ loratadine or allegra/ fexofenadine,  To see if post nasal drip is part of your cough  Take an acid blocker twice daily before meals- nexium or an otc like Pepcid or Omeprazole  Order- CXR- dx chronic cough,  sarcoid              Lab- ACE level- dx sarcoid  Ok to restart Methotrexate if needed

## 2011-07-09 LAB — ANGIOTENSIN CONVERTING ENZYME: Angiotensin-Converting Enzyme: 40 U/L (ref 8–52)

## 2011-07-10 ENCOUNTER — Telehealth: Payer: Self-pay | Admitting: Internal Medicine

## 2011-07-10 NOTE — Telephone Encounter (Signed)
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  She is returned Leigh's call regarding CXR and lab results.  I informed her of both as stated above per Dr. Maple Hudson.  She verbalized understanding of this and voiced no further questions/concerns at this time.

## 2011-07-13 DIAGNOSIS — K219 Gastro-esophageal reflux disease without esophagitis: Secondary | ICD-10-CM | POA: Insufficient documentation

## 2011-07-13 NOTE — Assessment & Plan Note (Signed)
Refluxed may be contributing to the cough. Try increasing acid blocker to twice daily before meals.

## 2011-07-13 NOTE — Assessment & Plan Note (Signed)
Okay for Dr.Deveshwar his methotrexate if that seems the best choice. Plan-ACE level, chest x-ray, cough syrup but also try increasing tramadol dose to 100 mg 3 times a day when necessary

## 2011-07-19 ENCOUNTER — Emergency Department (HOSPITAL_BASED_OUTPATIENT_CLINIC_OR_DEPARTMENT_OTHER)
Admission: EM | Admit: 2011-07-19 | Discharge: 2011-07-19 | Disposition: A | Discharge status: Home / Self Care | Payer: 59 | Attending: Emergency Medicine | Admitting: Emergency Medicine

## 2011-07-19 ENCOUNTER — Encounter (HOSPITAL_BASED_OUTPATIENT_CLINIC_OR_DEPARTMENT_OTHER): Payer: Self-pay | Admitting: *Deleted

## 2011-07-19 DIAGNOSIS — I2510 Atherosclerotic heart disease of native coronary artery without angina pectoris: Secondary | ICD-10-CM | POA: Insufficient documentation

## 2011-07-19 DIAGNOSIS — S61209A Unspecified open wound of unspecified finger without damage to nail, initial encounter: Secondary | ICD-10-CM | POA: Insufficient documentation

## 2011-07-19 DIAGNOSIS — IMO0001 Reserved for inherently not codable concepts without codable children: Secondary | ICD-10-CM | POA: Insufficient documentation

## 2011-07-19 DIAGNOSIS — Z87891 Personal history of nicotine dependence: Secondary | ICD-10-CM | POA: Insufficient documentation

## 2011-07-19 DIAGNOSIS — E785 Hyperlipidemia, unspecified: Secondary | ICD-10-CM | POA: Insufficient documentation

## 2011-07-19 DIAGNOSIS — W269XXA Contact with unspecified sharp object(s), initial encounter: Secondary | ICD-10-CM | POA: Insufficient documentation

## 2011-07-19 DIAGNOSIS — E119 Type 2 diabetes mellitus without complications: Secondary | ICD-10-CM | POA: Insufficient documentation

## 2011-07-19 DIAGNOSIS — S61219A Laceration without foreign body of unspecified finger without damage to nail, initial encounter: Secondary | ICD-10-CM

## 2011-07-19 MED ORDER — DOXYCYCLINE HYCLATE 100 MG PO CAPS: 100.0000 mg | ORAL_CAPSULE | Freq: Two times a day (BID) | ORAL | Status: AC

## 2011-07-19 MED ORDER — DOXYCYCLINE HYCLATE 100 MG PO CAPS: 100.0000 mg | ORAL_CAPSULE | Freq: Two times a day (BID) | ORAL | Status: DC

## 2011-07-19 NOTE — ED Provider Notes (Signed)
History     CSN: [id number]  Arrival date & time 07/19/11  1954   First MD Initiated Contact with Patient 07/19/11 2021      Chief Complaint  Patient presents with  . Wound Check    (Consider location/radiation/quality/duration/timing/severity/associated sxs/prior treatment) Patient is a 66 y.o. female presenting with hand pain. The history is provided by the patient.  Hand Pain This is a new problem. Episode onset: 2 days ago. The problem occurs constantly. The problem has been unchanged.  Pt reports she cut right index and middle finger on Thursday.  Husband cleaned and steristripped wound.  Pt came in to get checked because she is diabetic   Past Medical History  Diagnosis Date  . Hyperlipidemia   . Chest pain   . Nonsustained ventricular tachycardia   . Fibromyalgia   . Coronary artery disease     Mild to moderate CAD  . Palpitations     Past Surgical History  Procedure Date  . Cardiac catheterization 05/04/2007    reveals overall normal left ventricular systolic function. Ejection fraction 65-70%  . Breast surgery approx 1992    bilateral breast surgery for fibrocystic breast disease  . Total abdominal hysterectomy 1982    Family History  Problem Relation Age of Onset  . Heart disease Father   . Heart disease Paternal Grandmother   . Cancer Mother     unknown type, ?lung  . Cancer Paternal Grandmother     unknown type  . Diabetes Father   . Diabetes Brother   . Glaucoma Father   . Glaucoma Brother     History  Substance Use Topics  . Smoking status: Former Smoker -- 1.0 packs/day for 4 years    Types: Cigarettes    Quit date: 02/11/1980  . Smokeless tobacco: Not on file  . Alcohol Use: No    OB History    Grav Para Term Preterm Abortions TAB SAB Ect Mult Living                  Review of Systems  Skin: Positive for wound.  All other systems reviewed and are negative.    Allergies  Crestor  Home Medications   Current Outpatient Rx    Name Route Sig Dispense Refill  . ALPRAZOLAM 0.5 MG PO TABS Oral Take 0.25 mg by mouth at bedtime as needed. For sleep    . ASPIRIN 81 MG PO TABS Oral Take 81 mg by mouth daily.      . ATORVASTATIN CALCIUM 20 MG PO TABS Oral Take 1 tablet (20 mg total) by mouth daily. 30 tablet 5  . BENZONATATE 200 MG PO CAPS      . BRINZOLAMIDE 1 % OP SUSP Both Eyes Place 1 drop into both eyes 4 (four) times daily.     [name] ESOMEPRAZOLE MAGNESIUM 40 MG PO CPDR Oral Take 40 mg by mouth daily.      [name] GABAPENTIN 300 MG PO CAPS Oral Take 1 tablet by mouth daily.    [name] JANUMET 50-500 MG PO TABS Oral Take 1 tablet by mouth Twice daily.    . MOMETASONE FURO-FORMOTEROL FUM 100-5 MCG/ACT IN AERO Inhalation Inhale 2 puffs into the lungs 2 (two) times daily.      . NON FORMULARY  Prednisone Eye Drop Left Eye Every Two Hrs or PRN    . PROMETHAZINE-CODEINE 6.25-10 MG/5ML PO SYRP Oral Take 5 mLs by mouth every 6 (six) hours as needed for cough. 200 mL 0  .  TRAVATAN OP Both Eyes Place 1 drop into both eyes at bedtime.     [name] DOXYCYCLINE HYCLATE 100 MG PO CAPS Oral Take 1 capsule (100 mg total) by mouth 2 (two) times daily. 20 capsule 0  . HYDROCHLOROTHIAZIDE 25 MG PO TABS Oral Take 25 mg by mouth 3 (three) times a week.     . METHOTREXATE PO Oral Take by mouth every 7 (seven) days.    [name] METOPROLOL TARTRATE 50 MG PO TABS Oral Take 1 tablet (50 mg total) by mouth every morning. 30 tablet 6  . ONETOUCH ULTRA BLUE VI STRP      . TRAMADOL HCL 50 MG PO TABS Oral Take 50 mg by mouth 3 (three) times daily as needed. For pain. Maximum dose= 8 tablets per day      BP 148/69  Pulse 71  Temp(Src) 98.6 F (37 C) (Oral)  Resp 20  Ht 5\' 2"  (1.575 m)  Wt 155 lb (70.308 kg)  BMI 28.35 kg/m2  SpO2 96%  Physical Exam  Nursing note and vitals reviewed. Constitutional: She is oriented to person, place, and time. She appears well-developed and well-nourished.  Musculoskeletal:       1cm laceration middle finger,  2.5 cm laceration right  index finger,  [place],  Neurological: She is alert and oriented to person, place, and time. She has normal reflexes.  Skin: Skin is warm.  Psychiatric: She has a normal mood and affect.    ED Course  Procedures (including critical care time)  Labs Reviewed - No data to display No results found.   1. Laceration of finger of right hand       MDM  I counseled pt,  I advised her I can not suture wound due to age.  Pt is given rx for bactrim.   I advised here to see her MD for recheck on Monday.  Return if any problems.        Lonia Skinner [place], [place] 07/19/11 2319

## 2011-07-19 NOTE — Discharge Instructions (Signed)
Skin Infections A skin infection usually develops as a result of disruption of the skin barrier.  CAUSES  A skin infection might occur following:  Trauma or an injury to the skin such as a cut or insect sting.   Inflammation (as in eczema).   Breaks in the skin between the toes (as in athlete's foot).   Swelling (edema).  SYMPTOMS  The legs are the most common site affected. Usually there is:  Redness.   Swelling.   Pain.   There may be red streaks in the area of the infection.  TREATMENT   Minor skin infections may be treated with topical antibiotics, but if the skin infection is severe, hospital care and intravenous (IV) antibiotic treatment may be needed.   Most often skin infections can be treated with oral antibiotic medicine as well as proper rest and elevation of the affected area until the infection improves.   If you are prescribed oral antibiotics, it is important to take them as directed and to take all the pills even if you feel better before you have finished all of the medicine.   You may apply warm compresses to the area for 20-30 minutes 4 times daily.  You might need a tetanus shot now if:  You have no idea when you had the last one.   You have never had a tetanus shot before.   Your wound had dirt in it.  If you need a tetanus shot and you decide not to get one, there is a rare chance of getting tetanus. Sickness from tetanus can be serious. If you get a tetanus shot, your arm may swell and become red and warm at the shot site. This is common and not a problem. SEEK MEDICAL CARE IF:  The pain and swelling from your infection do not improve within 2 days.  SEEK IMMEDIATE MEDICAL CARE IF:  You develop a fever, chills, or other serious problems.  Document Released: 03/06/2004 Document Revised: 01/16/2011 Document Reviewed: 01/17/2008 Exit[employer] Patient Information 2012 [employer]. 

## 2011-07-19 NOTE — ED Notes (Signed)
Pt states she cut her right index and middle finger on Thursday. "Husband has been doctoring on it, but since I am a diabetic, I thought I'd better get it checked."

## 2011-07-19 NOTE — ED Notes (Signed)
I completed wound care after gently washing wound with "safety-cleans" and saline. I applied steri-strips then wrapped small kerlix over 2x2's.

## 2011-07-21 LAB — GLUCOSE, CAPILLARY: Glucose-Capillary: 113 mg/dL — ABNORMAL HIGH (ref 70–99)

## 2011-07-22 NOTE — ED Provider Notes (Signed)
Medical screening examination/treatment/procedure(s) were performed by non-physician practitioner and as supervising physician I was immediately available for consultation/collaboration.  Cyndra Numbers, MD 07/22/11 [phone]

## 2011-08-29 DIAGNOSIS — H409 Unspecified glaucoma: Secondary | ICD-10-CM | POA: Insufficient documentation

## 2011-09-08 ENCOUNTER — Ambulatory Visit: Payer: 59 | Admitting: Internal Medicine

## 2011-10-08 ENCOUNTER — Ambulatory Visit: Payer: 59 | Admitting: Internal Medicine

## 2011-10-09 ENCOUNTER — Encounter: Payer: Self-pay | Admitting: Internal Medicine

## 2011-10-09 ENCOUNTER — Ambulatory Visit (INDEPENDENT_AMBULATORY_CARE_PROVIDER_SITE_OTHER): Payer: 59 | Admitting: Internal Medicine

## 2011-10-09 VITALS — BP 118/64 | HR 73 | Ht 62.0 in | Wt 156.6 lb

## 2011-10-09 DIAGNOSIS — D869 Sarcoidosis, unspecified: Secondary | ICD-10-CM

## 2011-10-09 DIAGNOSIS — R0789 Other chest pain: Secondary | ICD-10-CM

## 2011-10-09 NOTE — Patient Instructions (Addendum)
Ok to restart methotrexate with Dr Corliss Skains if that is what she decides to do.  For the musculoskeletal pain, try increasing your tramadol to 2 tabs (100 mg) every 6 hours if needed.   Try a heating pad for your back.

## 2011-10-09 NOTE — Progress Notes (Signed)
02/05/11- 88 yoF former smoker seen on referral from Dr Corliss Skains for concern of sarcoid. Sarcoid was diagnosed by a series of ophthalmologists at [facility] and elsewhere, 20 years ago. 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 Shortness of breath has been chronic with one flight of stairs. She thinks she may be gradually getting worse. There is chronic dry cough which is been worse since a flulike illness 3 weeks ago. 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 I can't tell a specific arthritis diagnosis has been made. There is no history of liver or kidney disease. Bronchitis was diagnosed at the time of allergy skin testing a year ago. Food sensitivity to shrimp and crab which make her lips itch. Positive skin test also for grass and house dust. She has not been on allergy vaccine. A rescue inhaler has helped at times. 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 Advair was no help and Symbicort may have helped only a little. ACE 46 (8-52) 09/30/10 Medication choices been limited by glaucoma. No history of TB exposure. Past history of sinus infections. Active problem with heartburn which is partly suppressed with Nexium once or twice daily/ Dr Loreta Ave. Married, now on short-term disability. Quit smoking 1980 Several family members on her father's side had sarcoid.  03/10/11- 61 yoF former smoker followed for bronchitis, hx sarcoid, complicated by GERD, glaucoma Dulera inhaler did not have significant improvement. She is off of methotrexate and prednisone as of this month. 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 She occasionally wakes coughing with thick white mucus, stress incontinence. Just occasionally tight. DOE with one flight of stairs and has to stop at the top to get her breath but  no longer has to catch her breath halfway down stairs carrying laundry. Tussionex no longer works as well for cough as it used to.  07/08/11- 65 yoF former smoker followed for bronchitis, hx sarcoid, complicated by GERD, glaucoma Iincreased cough and congestion(nasal and chest)Productive-green/yellow in color x 3 days. Denies any fever but has been having chills. SOB and wheezing along with cough. SOB is all the time. Need to check if okay to start back on Methtrexate again. Dr Corliss Skains has been following for sarcoid of the eye and wants to resume methotrexate. There has been increased cough, productive of  mucus, worse at night and supine. Maybe some chills. Stress incontinence. Arthralgias. Postnasal drip. GERD is fairly well controlled with Nexium. Taking tramadol now for pain so is not helping the cough. Failed benzonatate. This was a prolonged visit 40 minutes. CXR 02/09/11-images reviewed with her. IMPRESSION:  Stable examination. No active cardiopulmonary process.  Original Report Authenticated By: Gerrianne Scale, M.D.    10/09/11- 72 yoF former smoker followed for bronchitis, hx sarcoid, complicated by GERD, glaucoma Cough-worse at night-yellow in color; drainage in throat; SOB and wheezing on bad days Active sarcoid uveitis- Dr Brewington/ Opth, Deveshwar/ Rheum, [facility].  Will see Dr.Deveshwar next month for decision about restarting methotrexate. Persistent pain mid anterior chest through to back, worse with prolonged standing and kitchen, eased by leaning over. No reflux or dysphagia. Expects seasonal bronchitis but denies routine cough or dyspnea, fever or sweats. CXR 07/10/11- negative. ACE 07/08/11- 40/ WNL.   ROS-see HPI Constitutional:   No-   weight loss, night sweats, fevers, chills, fatigue, lassitude. HEENT:   No-  headaches, difficulty swallowing, tooth/dental problems, sore throat,       Some  sneezing, itching, ear ache, +nasal congestion, post nasal drip,  CV:   + chest pain, no-orthopnea, PND, swelling in lower extremities, anasarca, dizziness, palpitations Resp: No-  shortness of breath with exertion not at rest.              No-   productive cough,  No non-productive cough,  No- coughing up of blood.                No-change in color of mucus.  No- wheezing.   Skin: No-   rash or lesions. GI:  No-   heartburn, indigestion, abdominal pain, nausea, vomiting,  GU:  MS:  +  joint pain or swelling,  Neuro-     nothing unusual Psych:  No- change in mood or affect. No depression or anxiety.  No memory loss.  OBJ General- Alert, Oriented, Affect-appropriate, Distress- none acute Skin- rash-none, lesions- none, excoriation- none Lymphadenopathy- none Head- atraumatic            Eyes- Gross vision intact, PERRLA, conjunctivae clear secretions- not injected            Ears- Hearing aid Left            Nose- sniffing, no-Septal dev, mucus, polyps, erosion, perforation             Throat- Mallampati II , mucosa clear , drainage- none, tonsils- atrophic Neck- flexible , trachea midline, no stridor , thyroid nl, carotid no bruit Chest - symmetrical excursion , unlabored           Heart/CV- RRR , no murmur , no gallop  , no rub, nl s1 s2                           - JVD- none , edema- none, stasis changes- none, varices- none           Lung- +slight dry cough, no wheeze , dullness-none, rub- none           Chest wall-  Abd-  Br/ Gen/ Rectal- Not done, not indicated Extrem- cyanosis- none, clubbing, none, atrophy- none, strength- nl Neuro- grossly intact to observation

## 2011-10-19 NOTE — Assessment & Plan Note (Signed)
No clear evidence of systemic sarcoid.

## 2011-10-19 NOTE — Assessment & Plan Note (Signed)
Association with standing upright suggests possibility of this is chronic pain from spine. Mediastinal adenopathy related to sarcoid is possible. Plan-increase tramadol to 100 mg every 6 hours if needed. Heating pad.

## 2011-12-08 ENCOUNTER — Telehealth: Payer: Self-pay | Admitting: Internal Medicine

## 2011-12-08 DIAGNOSIS — R042 Hemoptysis: Secondary | ICD-10-CM

## 2011-12-08 MED ORDER — AMOXICILLIN-POT CLAVULANATE 500-125 MG PO TABS: 1.0000 | ORAL_TABLET | Freq: Two times a day (BID) | ORAL | Status: DC

## 2011-12-08 NOTE — Telephone Encounter (Signed)
Pt c/o PND, sinus pain, nasal drainage that is blood streaked, sore throat and cough with blood tinged yellow mucus.  CDY pls advise. Allergies  Allergen Reactions  . Crestor (Rosuvastatin Calcium) Other (See Comments)    muscle aches    Per CDY, give Augmentin 500mg  # 14 1 po bid for 7 days, no refills and cxr to be done tomorrow, dx hemoptysis.  Pt aware of rx for abx and will come for cxr tomorrow at the [place] office. RX sent.

## 2011-12-09 ENCOUNTER — Ambulatory Visit (INDEPENDENT_AMBULATORY_CARE_PROVIDER_SITE_OTHER)
Admission: RE | Admit: 2011-12-09 | Discharge: 2011-12-09 | Disposition: A | Discharge status: Home / Self Care | Payer: 59 | Source: Ambulatory Visit | Attending: Internal Medicine | Admitting: Internal Medicine

## 2011-12-09 DIAGNOSIS — R042 Hemoptysis: Secondary | ICD-10-CM

## 2011-12-10 ENCOUNTER — Telehealth: Payer: Self-pay | Admitting: Internal Medicine

## 2011-12-10 MED ORDER — AMOXICILLIN-POT CLAVULANATE 875-125 MG PO TABS: 1.0000 | ORAL_TABLET | Freq: Two times a day (BID) | ORAL | Status: DC

## 2011-12-10 NOTE — Telephone Encounter (Signed)
Change to augmentin 875mg  po bid. Do 10 day treatment atleast starting 12/10/2011   Do netti pot saline wash daily - she needs the drainage out to prevent coughing or cut down coughing. Cough meds do not work!

## 2011-12-10 NOTE — Telephone Encounter (Signed)
Pt returned call from triage. Kathleen W Perdue °

## 2011-12-10 NOTE — Telephone Encounter (Signed)
Spoke with patient-aware of recs from MR. Rx sent to pharmacy on file.

## 2011-12-10 NOTE — Telephone Encounter (Signed)
ATC no answer [id number] 

## 2011-12-10 NOTE — Telephone Encounter (Signed)
Spoke with patient c/o sinus infection w constant prod cough containing yellow mucus w strings of blood in it.  Patient also reports PND w increasing amount of drainage. Patient reports having a fever which has now broken.  Patient states coughing is getting very bad and is causing her chest to hurt.  Patient states she was started on amoxicillin for infection x2days ago patient is now requesting something to relieve her cough.  Dr. Maple Hudson please advise, thank you!

## 2011-12-10 NOTE — Telephone Encounter (Signed)
lmomtcb  

## 2011-12-31 ENCOUNTER — Telehealth: Payer: Self-pay | Admitting: Obstetrics and Gynecology

## 2011-12-31 ENCOUNTER — Other Ambulatory Visit: Payer: Self-pay

## 2011-12-31 DIAGNOSIS — R102 Pelvic and perineal pain: Secondary | ICD-10-CM

## 2011-12-31 NOTE — Telephone Encounter (Signed)
TC TO PT REGARDING MESSAGE. PT STATES THAT SHE HAS BEEN HAVING PELVIC PAIN;MORE ON THE RIGHT SIDE X FOUR DAYS. PT STATES WITH PAIN MED IT IS A LITTLE BETTER BUT SHE WOULD LIKE TO BE SEEN BY ND FOR EVAL. SCHEDULE PT AN APPT ON

## 2012-01-01 ENCOUNTER — Encounter: Payer: Self-pay | Admitting: Obstetrics and Gynecology

## 2012-01-01 ENCOUNTER — Ambulatory Visit (INDEPENDENT_AMBULATORY_CARE_PROVIDER_SITE_OTHER): Payer: 59

## 2012-01-01 ENCOUNTER — Other Ambulatory Visit: Payer: Self-pay | Admitting: Obstetrics and Gynecology

## 2012-01-01 ENCOUNTER — Ambulatory Visit (INDEPENDENT_AMBULATORY_CARE_PROVIDER_SITE_OTHER): Payer: 59 | Admitting: Obstetrics and Gynecology

## 2012-01-01 VITALS — BP 140/78 | Wt 159.0 lb

## 2012-01-01 DIAGNOSIS — R300 Dysuria: Secondary | ICD-10-CM

## 2012-01-01 DIAGNOSIS — N949 Unspecified condition associated with female genital organs and menstrual cycle: Secondary | ICD-10-CM

## 2012-01-01 DIAGNOSIS — R319 Hematuria, unspecified: Secondary | ICD-10-CM

## 2012-01-01 DIAGNOSIS — R102 Pelvic and perineal pain: Secondary | ICD-10-CM

## 2012-01-01 LAB — POCT URINALYSIS DIPSTICK
Bilirubin, UA: NEGATIVE
Blood, UA: 250
Glucose, UA: NEGATIVE
Ketones, UA: NEGATIVE
Spec Grav, UA: 1.02
Urobilinogen, UA: NEGATIVE
pH, UA: 5

## 2012-01-01 MED ORDER — HYDROCODONE-ACETAMINOPHEN 5-500 MG PO TABS: 1.0000 | ORAL_TABLET | Freq: Four times a day (QID) | ORAL | Status: DC | PRN

## 2012-01-01 NOTE — Progress Notes (Signed)
F/u u/s for pelvic pain, pt states she has discomfort with urination.  Pt has had pain for 4 days.  The pain is in her right lower quadrant.  It is intermittent.  She denies any h/o of kidney stones.  She has felt warm per her report.  She does have dysuria.   BP 140/78  Wt 159 lb (72.122 kg) Temp 98.8 Physical Examination: General appearance - alert, well appearing, and in no distress Chest - clear to auscultation, no wheezes, rales or rhonchi, symmetric air entry Heart - normal rate and regular rhythm Abdomen - soft, nontender, nondistended, no masses or organomegaly.  Positive suprapubic tenderness Pelvic - VULVA: normal appearing vulva with no masses, tenderness or lesions, VAGINA: normal appearing vagina with normal color and discharge, no lesions.  No blood in the vault [place] normal vaginal cuff No adnexal masses seen Hematuria Pelvic pain Urine strainer Pyridium Refer to Dr Brunilda Payor If she is not better by Monday plan to do CT scan to r/o kidney stones Pt just finished augmentin.  She does not want an abx now

## 2012-01-01 NOTE — Patient Instructions (Signed)
Hematuria, Adult  Hematuria (blood in your urine) can be caused by a bladder infection (cystitis), kidney infection (pyelonephritis), prostate infection (prostatitis), or kidney stone. Infections will usually respond to antibiotics (medications which kill germs), and a kidney stone will usually pass through your urine without further treatment. If you were put on antibiotics, take all the medicine until gone. You may feel better in a few days, but take all of your medicine or the infection may not respond and become more difficult to treat. If antibiotics were not given, an infection did not cause the blood in the urine. A further work up to find out the reason may be needed.  HOME CARE INSTRUCTIONS    Drink lots of fluid, 3 to 4 quarts a day. If you have been diagnosed with an infection, cranberry juice is especially recommended, in addition to large amounts of water.   Avoid caffeine, tea, and carbonated beverages, because they tend to irritate the bladder.   Avoid alcohol as it may irritate the prostate.   Only take over-the-counter or prescription medicines for pain, discomfort, or fever as directed by your caregiver.   If you have been diagnosed with a kidney stone follow your caregivers instructions regarding straining your urine to catch the stone.  TO PREVENT FURTHER INFECTIONS:   Empty the bladder often. Avoid holding urine for long periods of time.   After a bowel movement, women should cleanse front to back. Use each tissue only once.   Empty the bladder before and after sexual intercourse if you are a female.   Return to your caregiver if you develop back pain, fever, nausea (feeling sick to your stomach), vomiting, or your symptoms (problems) are not better in 3 days. Return sooner if you are getting worse.  If you have been requested to return for further testing make sure to keep your appointments. If an infection is not the cause of blood in your urine, X-rays may be required. Your caregiver  will discuss this with you.  SEEK IMMEDIATE MEDICAL CARE IF:    You have a persistent fever over 102 F (38.9 C).   You develop severe vomiting and are unable to keep the medication down.   You develop severe back or abdominal pain despite taking your medications.   You begin passing a large amount of blood or clots in your urine.   You feel extremely weak or faint, or pass out.  MAKE SURE YOU:    Understand these instructions.   Will watch your condition.   Will get help right away if you are not doing well or get worse.  Document Released: 01/27/2005 Document Revised: 04/21/2011 Document Reviewed: 09/16/2007  Exit[employer] Patient Information 2013 [employer].

## 2012-01-02 NOTE — Progress Notes (Signed)
Referral to [facility] to see Dr. Brunilda Payor for eval of hematuria and passing of blood clots all records fax they will contact pt with appt date and time

## 2012-01-03 LAB — URINE CULTURE: Colony Count: >=100000

## 2012-01-06 ENCOUNTER — Telehealth: Payer: Self-pay

## 2012-01-06 MED ORDER — CIPROFLOXACIN HCL 500 MG PO TABS: 500.0000 mg | ORAL_TABLET | Freq: Two times a day (BID) | ORAL | Status: DC

## 2012-01-06 NOTE — Telephone Encounter (Signed)
Spoke with pt informed lab results and rx sent to pharm pt voice understanding

## 2012-01-06 NOTE — Telephone Encounter (Signed)
Lm on vm tcb rgd labs 

## 2012-01-06 NOTE — Telephone Encounter (Signed)
Message copied by Rolla Plate on Tue Jan 06, 2012 11:45 AM ------      Message from: Jaymes Graff      Created: Tue Jan 06, 2012 11:15 AM       Please tell pt she has a UTI and needs to take ciprofloxacin 500 mg every 12 hours for 7 days.  Disp #14.

## 2012-01-28 ENCOUNTER — Telehealth: Payer: Self-pay | Admitting: Cardiovascular Disease

## 2012-01-28 DIAGNOSIS — I472 Ventricular tachycardia: Secondary | ICD-10-CM

## 2012-01-28 DIAGNOSIS — I4729 Other ventricular tachycardia: Secondary | ICD-10-CM

## 2012-01-28 DIAGNOSIS — R002 Palpitations: Secondary | ICD-10-CM

## 2012-01-28 NOTE — Telephone Encounter (Signed)
Spoke with pt, she has been having fluttering off and on all day. She will stop what she is doing and it will settle down. Today is the first day it has kept occuring. She feels fine, no episodes of feeling faint. She will take another lopressor now if her heart rate is above 60. Will forward for dr nahser's review. Pt agreed with this plan.

## 2012-01-28 NOTE — Telephone Encounter (Signed)
Pt has has numbness in her hands last night and she is SOB and her heart has been fluttering off and on all day

## 2012-01-30 ENCOUNTER — Encounter (INDEPENDENT_AMBULATORY_CARE_PROVIDER_SITE_OTHER): Payer: 59

## 2012-01-30 DIAGNOSIS — R002 Palpitations: Secondary | ICD-10-CM

## 2012-01-30 DIAGNOSIS — I4729 Other ventricular tachycardia: Secondary | ICD-10-CM

## 2012-01-30 DIAGNOSIS — I472 Ventricular tachycardia: Secondary | ICD-10-CM

## 2012-01-30 NOTE — Progress Notes (Signed)
Patient ID: Sheila Reynolds, female   DOB: 1945/09/09, 66 y.o.   MRN: [id number] Placed an event monitor 01/30/2012, I also went over instructions on how to use the monitor and when to send it back.

## 2012-01-30 NOTE — Telephone Encounter (Signed)
Left msg for pt that our office will call today to set app for monitor.

## 2012-01-30 NOTE — Progress Notes (Unsigned)
Placed an event monitor  On 01/30/2012. I also went over instructions how to use the monitor and when to return it.

## 2012-01-30 NOTE — Telephone Encounter (Signed)
Please place an event monitor. She has a hx of nonsustained VT.

## 2012-02-17 ENCOUNTER — Ambulatory Visit (INDEPENDENT_AMBULATORY_CARE_PROVIDER_SITE_OTHER): Payer: 59 | Admitting: Obstetrics and Gynecology

## 2012-02-17 ENCOUNTER — Encounter: Payer: Self-pay | Admitting: Obstetrics and Gynecology

## 2012-02-17 ENCOUNTER — Ambulatory Visit: Payer: 59 | Admitting: Obstetrics and Gynecology

## 2012-02-17 VITALS — BP 120/80 | Ht 62.0 in | Wt 161.0 lb

## 2012-02-17 DIAGNOSIS — R300 Dysuria: Secondary | ICD-10-CM

## 2012-02-17 DIAGNOSIS — N951 Menopausal and female climacteric states: Secondary | ICD-10-CM

## 2012-02-17 DIAGNOSIS — Z124 Encounter for screening for malignant neoplasm of cervix: Secondary | ICD-10-CM

## 2012-02-17 LAB — POCT URINALYSIS DIPSTICK
Leukocytes, UA: NEGATIVE
Protein, UA: NEGATIVE
Urobilinogen, UA: NEGATIVE
pH, UA: 7

## 2012-02-17 MED ORDER — ESTROGENS CONJUGATED 0.3 MG PO TABS: 0.3000 mg | ORAL_TABLET | Freq: Every day | ORAL | Status: DC

## 2012-02-17 MED ORDER — VALACYCLOVIR HCL 1 G PO TABS: 500.0000 mg | ORAL_TABLET | Freq: Two times a day (BID) | ORAL | Status: AC

## 2012-02-17 NOTE — Progress Notes (Signed)
Last Pap: 1/13 WNL: Yes Regular Periods:no Contraception: n/a  Monthly Breast exam:yes Tetanus<59yrs:yes Nl.Bladder Function:yes Daily BMs:yes Healthy Diet:yes Calcium:yes Mammogram:yes Date of Mammogram: 12/2011 Exercise:yes Have often Exercise: n/a Seatbelt: yes Abuse at home: no Stressful work:no Sigmoid-colonoscopy: 2013 polyp-bengin Bone Density: Yes wnl per pt PCP: Dr.Sanders Change in PMH: pt wearing heart monitor for 30 days Change in FMH:no change Pt with several concerns.  She is wearing a holter monitor because of flutters.  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  She wants HRT again.  She used premarin in the past without any problems  BP 120/80  Ht 5\' 2"  (1.575 m)  Wt 161 lb (73.029 kg)  BMI 29.45 kg/m2 Physical Examination: Neck - supple, no significant adenopathy Chest - clear to auscultation, no wheezes, rales or rhonchi, symmetric air entry Heart - normal rate, regular rhythm, normal S1, S2, no murmurs, rubs, clicks or gallops Abdomen - soft, nontender, nondistended, no masses or organomegaly Breasts - breasts appear normal, no suspicious masses, no skin or nipple changes or axillary nodes Pelvic - normal external genitalia, vulva, vagina, cervix, uterus and adnexa, atrophic Rectal - normal rectal, no masses Musculoskeletal - no joint tenderness, deformity or swelling Extremities - peripheral pulses normal, no pedal edema, no clubbing or cyanosis Skin - normal coloration and turgor, no rashes, no suspicious skin lesions noted Results for orders placed in visit on 01/01/12  POCT URINALYSIS DIPSTICK      Component Value Range   Color, UA       Clarity, UA       Glucose, UA negative     Bilirubin, UA negative     Ketones, UA negative     Spec Grav, UA 1.020     Blood, UA 250     pH, UA 5.0     Protein, UA trace     Urobilinogen, UA negative     Nitrite, UA +     Leukocytes, UA moderate (2+)    URINE  CULTURE      Component Value Range   Culture ESCHERICHIA COLI     Colony Count >=100,000 COLONIES/ML     Organism ID, Bacteria ESCHERICHIA COLI      Normal AEX Pt due for mammogram no colonoscopy due no Pap done no Hematuria has resolved.   RT one year Diet and exercise discussed HSV valtrex RX given to pt  Pt c/o discomfort with urination.  Send urine culture.  Hematuria has resolved.  Work up by urology was negative menopausal symptoms.  Pt desires premarin will give dose.  R&B reviewed.  F/u in 3 months Pt with holter monitor will f/u with cardilogist

## 2012-02-17 NOTE — Patient Instructions (Signed)

## 2012-02-19 LAB — URINE CULTURE: Colony Count: 50000

## 2012-02-26 ENCOUNTER — Other Ambulatory Visit: Payer: Self-pay | Admitting: *Deleted

## 2012-02-26 MED ORDER — METOPROLOL TARTRATE 50 MG PO TABS: 50.0000 mg | ORAL_TABLET | ORAL | Status: DC

## 2012-02-26 NOTE — Telephone Encounter (Signed)
NO REFILLS UNTIL APPOINTMENT Fax Received. Refill Completed. Sheila Reynolds (R.M.A)   

## 2012-03-01 ENCOUNTER — Telehealth: Payer: Self-pay | Admitting: *Deleted

## 2012-03-01 NOTE — Telephone Encounter (Signed)
PT WAS GIVEN RESULTS OF ECARDIO MONITOR, NSR W/ OCC PVC'S, PT VERBALIZED UNDERSTANDING.

## 2012-03-03 ENCOUNTER — Telehealth: Payer: Self-pay | Admitting: *Deleted

## 2012-03-03 NOTE — Telephone Encounter (Signed)
Results of 30 day monitor given, nsr, pvc's pac's. Pt verbalized understanding. Alfonso Ramus RN

## 2012-03-11 ENCOUNTER — Encounter: Payer: Self-pay | Admitting: Cardiovascular Disease

## 2012-03-11 ENCOUNTER — Ambulatory Visit (INDEPENDENT_AMBULATORY_CARE_PROVIDER_SITE_OTHER): Payer: 59 | Admitting: Cardiovascular Disease

## 2012-03-11 VITALS — BP 152/80 | HR 70 | Ht 62.0 in | Wt 163.0 lb

## 2012-03-11 DIAGNOSIS — R002 Palpitations: Secondary | ICD-10-CM | POA: Insufficient documentation

## 2012-03-11 DIAGNOSIS — I472 Ventricular tachycardia, unspecified: Secondary | ICD-10-CM

## 2012-03-11 DIAGNOSIS — E785 Hyperlipidemia, unspecified: Secondary | ICD-10-CM

## 2012-03-11 DIAGNOSIS — I10 Essential (primary) hypertension: Secondary | ICD-10-CM

## 2012-03-11 DIAGNOSIS — I4729 Other ventricular tachycardia: Secondary | ICD-10-CM

## 2012-03-11 LAB — BASIC METABOLIC PANEL
CO2: 28 mEq/L (ref 19–32)
Calcium: 9.1 mg/dL (ref 8.4–10.5)
Chloride: 103 mEq/L (ref 96–112)
Glucose, Bld: 112 mg/dL — ABNORMAL HIGH (ref 70–99)
Sodium: 138 mEq/L (ref 135–145)

## 2012-03-11 LAB — HEPATIC FUNCTION PANEL
Albumin: 4.2 g/dL (ref 3.5–5.2)
Alkaline Phosphatase: 106 U/L (ref 39–117)
Total Protein: 7.4 g/dL (ref 6.0–8.3)

## 2012-03-11 LAB — LIPID PANEL
HDL: 40.2 mg/dL (ref >39.00)
LDL Cholesterol: 69 mg/dL (ref 0–99)
Total CHOL/HDL Ratio: 3
Triglycerides: 91 mg/dL (ref 0.0–149.0)

## 2012-03-11 MED ORDER — METOPROLOL TARTRATE 50 MG PO TABS: 50.0000 mg | ORAL_TABLET | Freq: Two times a day (BID) | ORAL | Status: DC

## 2012-03-11 NOTE — Patient Instructions (Addendum)
Your physician recommends that you return for a FASTING lipid profile: TODAY  Your physician has recommended you make the following change in your medication:   INCREASE METOPROLOL 50 MG TWICE A DAY (12 HOURS APART)  Your physician wants you to follow-up in: 6 MONTHS You will receive a reminder letter in the mail two months in advance. If you don't receive a letter, please call our office to schedule the follow-up appointment.

## 2012-03-11 NOTE — Assessment & Plan Note (Signed)
Her blood pressure remains mildly elevated. We will increase her Toprol slightly and 50 mg twice a day. She's on HCTZ but She does not take every day because she developed leg cramps. We may need to have her take every day and potassium supplement.  She informed that we'll try potassium supplements in the past and it didn't seem to help with the leg cramps.   We'll check labs today. We'll continue to follow this.

## 2012-03-11 NOTE — Assessment & Plan Note (Signed)
Stable

## 2012-03-11 NOTE — Progress Notes (Signed)
Sheila Reynolds Date of Birth  February 24, 1945 [facility]  [address]    [address]   [address] [place], [place]  [address]    [place], [place]  [address] [phone]  Fax  [phone]  [phone]  Fax [phone]  Problem list: 1. Hyperlipidemia 2. Chest pain 3. Nonsustained ventricular tachycardia 4. Mild to moderate coronary artery disease by cath in 2009. 5. 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 female with the above noted hx.  She has continued to have some palpitations.  She has occasional episodes of lightheadedness when she has palpitations. Her main issue has been lots of total body cramps. She has been seen in the emergency room. Her lab work looked fine.  She's had lots of dyspnea especially with exertion. She has a history of sarcoidosis and is followed by Dr. Maple Hudson.  March 11, 2012: Sheila Reynolds has had more palpitations recently.  It Appears that her metoprolol dose was decreased slightly.  We placed an event monitor on her. She did not have any episodes of atrial fibrillation. She has occasional drinker atrial contractions and occasional premature ventricular contractions. There were no life-threatening arrhythmias.  She is doing well from a cardiac standpoint.   She's not had any episodes of syncope.  She has significant shortness of breath especially when climbing stairs. This is likely due to her sarcoidosis. She denies any chest pain. Her BP is a bit elevated.  She avoids salt.    Current Outpatient Prescriptions on File Prior to Visit  Medication Sig Dispense Refill  . Albuterol Sulfate (VENTOLIN HFA IN) Inhale into the lungs.      . ALPRAZolam (XANAX) 0.5 MG tablet Take 0.25 mg by mouth 2 (two) times daily as needed. For sleep      . aspirin 81 MG tablet Take 81 mg by mouth daily.        [name] atorvastatin (LIPITOR) 20 MG tablet Take 1 tablet (20 mg total) by mouth  daily.  30 tablet  5  . brinzolamide (AZOPT) 1 % ophthalmic suspension Place 1 drop into both eyes 3 (three) times daily.       [name] esomeprazole (NEXIUM) 40 MG capsule Take 40 mg by mouth daily.        [name] estrogens, conjugated, (PREMARIN) 0.3 MG tablet Take 1 tablet (0.3 mg total) by mouth daily. Take daily for 21 days then do not take for 7 days.  30 tablet  11  . fexofenadine (ALLEGRA) 180 MG tablet Take 180 mg by mouth daily.      . folic acid (FOLVITE) 1 MG tablet Take 1 mg by mouth daily. 2 tablets daily      . hydrochlorothiazide (HYDRODIURIL) 25 MG tablet Take 25 mg by mouth 3 (three) times a week.       [name] JANUMET 50-500 MG per tablet Take 1 tablet by mouth Twice daily.      . Methotrexate Sodium (METHOTREXATE PO) Take by mouth every 7 (seven) days.      . metoprolol (LOPRESSOR) 50 MG tablet Take 1 tablet (50 mg total) by mouth every morning.  30 tablet  0  . mometasone-formoterol (DULERA) 100-5 MCG/ACT AERO Inhale 2 puffs into the lungs 2 (two) times daily.        . NON FORMULARY Prednisone Eye Drop Left Eye Every Two Hrs or PRN      .  ONE TOUCH ULTRA TEST test strip       . promethazine-codeine (PHENERGAN WITH CODEINE) 6.25-10 MG/5ML syrup Take 5 mLs by mouth every 6 (six) hours as needed for cough.  200 mL  0  . Travoprost (TRAVATAN OP) Place 1 drop into both eyes at bedtime.         Allergies  Allergen Reactions  . Crestor (Rosuvastatin Calcium) Other (See Comments)    muscle aches    Past Medical History  Diagnosis Date  . Hyperlipidemia   . Chest pain   . Nonsustained ventricular tachycardia   . Fibromyalgia   . Coronary artery disease     Mild to moderate CAD  . Palpitations     Past Surgical History  Procedure Date  . Cardiac catheterization 05/04/2007    reveals overall normal left ventricular systolic function. Ejection fraction 65-70%  . Breast surgery approx 1992    bilateral breast surgery for fibrocystic breast disease  . Total abdominal hysterectomy 1982     History  Smoking status  . Former Smoker -- 1.0 packs/day for 4 years  . Types: Cigarettes  . Quit date: 02/11/1980  Smokeless tobacco  . Not on file    History  Alcohol Use No    Family History  Problem Relation Age of Onset  . Heart disease Father   . Heart disease Paternal Grandmother   . Cancer Mother     unknown type, ?lung  . Cancer Paternal Grandmother     unknown type  . Diabetes Father   . Diabetes Brother   . Glaucoma Father   . Glaucoma Brother     Reviw of Systems:  Reviewed in the HPI.  All other systems are negative.  Physical Exam: Blood pressure 152/80, pulse 70, height 5\' 2"  (1.575 m), weight 163 lb (73.936 kg), SpO2 98.00%. General: Well developed, well nourished, in no acute distress.  Head: Normocephalic, atraumatic, sclera non-icteric, mucus membranes are moist,   Neck: Supple. Negative for carotid bruits. JVD not elevated.  Lungs: Clear bilaterally to auscultation without wheezes, rales, or rhonchi. Breathing is unlabored.  Heart: RRR with S1 S2. No murmurs, rubs, or gallops appreciated.  Abdomen: Soft, non-tender, non-distended with normoactive bowel sounds. No hepatomegaly. No rebound/guarding. No obvious abdominal masses.  Msk:  Strength and tone appear normal for age.  Extremities: No clubbing or cyanosis. No edema.  Distal pedal pulses are 2+ and equal bilaterally.  Neuro: Alert and oriented X 3. Moves all extremities spontaneously.  Psych:  Responds to questions appropriately with a normal affect.  ECG: March 11, 2012: Normal sinus rhythm at 70 beats a minute. She has occasional sinus arrhythmia. EKG is otherwise normal. Assessment / Plan:

## 2012-03-11 NOTE — Assessment & Plan Note (Signed)
Sheila Reynolds  is having frequent episodes of palpitations. Event monitor reveals premature atrial contractions and premature ventricular contractions. She had no evidence of atrial fibrillation and had no ventricular tachycardia. We'll need to watch very closely to make sure that she doesn' We'll need to watch very closely to. I'll see her again in 6 months.

## 2012-04-09 ENCOUNTER — Ambulatory Visit: Payer: 59 | Admitting: Internal Medicine

## 2012-05-10 ENCOUNTER — Encounter: Payer: Self-pay | Admitting: Internal Medicine

## 2012-05-10 ENCOUNTER — Ambulatory Visit (INDEPENDENT_AMBULATORY_CARE_PROVIDER_SITE_OTHER): Payer: 59 | Admitting: Internal Medicine

## 2012-05-10 ENCOUNTER — Other Ambulatory Visit (INDEPENDENT_AMBULATORY_CARE_PROVIDER_SITE_OTHER): Payer: 59

## 2012-05-10 VITALS — BP 126/76 | HR 78 | Ht 62.75 in | Wt 162.4 lb

## 2012-05-10 DIAGNOSIS — M549 Dorsalgia, unspecified: Secondary | ICD-10-CM

## 2012-05-10 DIAGNOSIS — R0789 Other chest pain: Secondary | ICD-10-CM

## 2012-05-10 DIAGNOSIS — D869 Sarcoidosis, unspecified: Secondary | ICD-10-CM

## 2012-05-10 LAB — BASIC METABOLIC PANEL
Chloride: 101 mEq/L (ref 96–112)
Creatinine, Ser: 0.7 mg/dL (ref 0.4–1.2)
Potassium: 4.3 mEq/L (ref 3.5–5.1)

## 2012-05-10 MED ORDER — PREDNISONE 10 MG PO TABS: ORAL_TABLET | ORAL | Status: DC

## 2012-05-10 NOTE — Patient Instructions (Addendum)
Order- CT chest with contrast    Dx sarcoid, back pain    Will need pretreatment protocol with benadryl for hx shellfish/ ? Contrast allergy and claustrophobia              Lab- BMET

## 2012-05-10 NOTE — Progress Notes (Signed)
02/05/11- 66 yoF former smoker seen on referral from Dr Estanislado Pandy for concern of sarcoid. 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 Shortness of breath has been chronic with one flight of stairs. She thinks she may be gradually getting worse. There is chronic dry cough which is been worse since a flulike illness 3 weeks ago. 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 I can't tell a specific arthritis diagnosis has been made. There is no history of liver or kidney disease. Bronchitis was diagnosed at the time of allergy skin testing a year ago. Food sensitivity to shrimp and crab which make her lips itch. Positive skin test also for grass and house dust. She has not been on allergy vaccine. A rescue inhaler has helped at times. 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 Advair was no help and Symbicort may have helped only a little. ACE 46 (8-52) 09/30/10 Medication choices been limited by glaucoma. No history of TB exposure. Past history of sinus infections. Active problem with heartburn which is partly suppressed with Nexium once or twice daily/ Dr Collene Mares. Married, now on short-term disability. Quit smoking 1980 Several family members on her father's side had sarcoid.  03/10/11- 66 yoF former smoker followed for bronchitis, hx sarcoid, complicated by GERD, glaucoma Dulera inhaler did not have significant improvement. She is off of methotrexate and prednisone as of this month. 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 She occasionally wakes coughing with thick white mucus, stress incontinence. Just occasionally tight. DOE with one flight of stairs and has to stop at the top to get her breath but  no longer has to catch her breath halfway down stairs carrying laundry. Tussionex no longer works as well for cough as it used to.  07/08/11- 65 yoF former smoker followed for bronchitis, hx sarcoid, complicated by GERD, glaucoma Iincreased cough and congestion(nasal and chest)Productive-green/yellow in color x 3 days. Denies any fever but has been having chills. SOB and wheezing along with cough. SOB is all the time. Need to check if okay to start back on Methtrexate again. Dr Estanislado Pandy has been following for sarcoid of the eye and wants to resume methotrexate. There has been increased cough, productive of  mucus, worse at night and supine. Maybe some chills. Stress incontinence. Arthralgias. Postnasal drip. GERD is fairly well controlled with Nexium. Taking tramadol now for pain so is not helping the cough. Failed benzonatate. This was a prolonged visit 40 minutes. CXR 02/09/11-images reviewed with her. IMPRESSION:  Stable examination. No active cardiopulmonary process.  Original Report Authenticated By: Vivia Ewing, M.D.    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  Will see Dr.Deveshwar next month for decision about restarting methotrexate. Persistent pain mid anterior chest through to back, worse with prolonged standing and kitchen, eased by leaning over. No reflux or dysphagia. Expects seasonal bronchitis but denies routine cough or dyspnea, fever or sweats. CXR 07/10/11- negative. ACE 07/08/11- 40/ WNL.  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   FOLLOWS FOR: still having problems with SOB with activity. No change in complaints of pain, mid anterior chest, left greater than right mostly around into her back and with prolonged standing she has to stop kitchen work to sit down. We discussed this as more of a back problem and a chest problem. She has seen Dr.Van Alvin Critchley w/ [facility] . Dr. Corliss Skains is treating with ibuprofen.  ROS-see HPI Constitutional:   No-   weight loss, night sweats, fevers, chills, fatigue, lassitude. HEENT:   No-  headaches, difficulty swallowing, tooth/dental problems, sore throat,       Some  sneezing, itching, ear ache, +nasal congestion, post nasal drip,  CV:  + chest pain, no-orthopnea, PND, swelling in lower extremities, anasarca, dizziness, palpitations Resp: No-  shortness of breath with exertion not at rest.              No-   productive cough,  No non-productive cough,  No- coughing up of blood.                No-change in color of mucus.  No- wheezing.   Skin: No-   rash or lesions. GI:  No-   heartburn, indigestion, abdominal pain, nausea, vomiting,  GU:  MS:  +  joint pain or swelling, + back pain Neuro-     nothing unusual Psych:  No- change in mood or affect. No depression or anxiety.  No memory loss.  OBJ General- Alert, Oriented, Affect-appropriate, Distress- none acute Skin- rash-none, lesions- none, excoriation- none Lymphadenopathy- none Head- atraumatic            Eyes- Gross vision intact, PERRLA, conjunctivae clear secretions- not injected            Ears- Hearing aid Left            Nose- sniffing, no-Septal dev, mucus, polyps, erosion, perforation             Throat- Mallampati II , mucosa clear , drainage- none, tonsils- atrophic Neck- flexible , trachea midline, no stridor , thyroid nl, carotid no bruit Chest - symmetrical excursion , unlabored           Heart/CV- RRR , no murmur , no gallop  , no rub, nl s1 s2                           - JVD- none , edema- none,  stasis changes- none, varices- none           Lung- no-cough, no wheeze , dullness-none, rub- none           Chest wall-  Abd-  Br/ Gen/ Rectal- Not done, not indicated Extrem- cyanosis- none, clubbing, none, atrophy- none, strength- nl Neuro- grossly intact to observation

## 2012-05-11 NOTE — Progress Notes (Signed)
Quick Note:  LMTCB ______ 

## 2012-05-13 ENCOUNTER — Telehealth: Payer: Self-pay | Admitting: Internal Medicine

## 2012-05-13 ENCOUNTER — Ambulatory Visit (INDEPENDENT_AMBULATORY_CARE_PROVIDER_SITE_OTHER)
Admission: RE | Admit: 2012-05-13 | Discharge: 2012-05-13 | Disposition: A | Discharge status: Home / Self Care | Payer: 59 | Source: Ambulatory Visit | Attending: Internal Medicine | Admitting: Internal Medicine

## 2012-05-13 DIAGNOSIS — M549 Dorsalgia, unspecified: Secondary | ICD-10-CM

## 2012-05-13 DIAGNOSIS — D869 Sarcoidosis, unspecified: Secondary | ICD-10-CM

## 2012-05-13 MED ORDER — IOHEXOL 300 MG/ML  SOLN
80.0000 mL | Freq: Once | INTRAMUSCULAR | Status: AC | PRN
  Administered 2012-05-13: 80 mL via INTRAVENOUS

## 2012-05-13 NOTE — Telephone Encounter (Signed)
Result Note    Normal basic chemistry and kidney function   lmomtcb x1

## 2012-05-14 ENCOUNTER — Other Ambulatory Visit: Payer: 59

## 2012-05-14 NOTE — Telephone Encounter (Signed)
Spoke with patient, she is aware of results as listed below (lab work) per Dr. Maple Hudson.  Patient states she has CT done yesterday 05/13/12 and was told not to restart her diabetic medications for 48 hrs but was also told to call and check with her MD. Patient would like to know should she restart meds now or wait until tomorrow? Please advise Dr. Maple Hudson, thank you  Current Outpatient Prescriptions on File Prior to Visit  Medication Sig Dispense Refill  . Albuterol Sulfate (VENTOLIN HFA IN) Inhale into the lungs.      . ALPRAZolam (XANAX) 0.5 MG tablet Take 0.25 mg by mouth 2 (two) times daily as needed. For sleep      . aspirin 81 MG tablet Take 81 mg by mouth daily.        [name] atorvastatin (LIPITOR) 20 MG tablet Take 1 tablet (20 mg total) by mouth daily.  30 tablet  5  . brinzolamide (AZOPT) 1 % ophthalmic suspension Place 1 drop into both eyes 3 (three) times daily.       [name] esomeprazole (NEXIUM) 40 MG capsule Take 40 mg by mouth daily.        [name] estrogens, conjugated, (PREMARIN) 0.3 MG tablet Take 1 tablet (0.3 mg total) by mouth daily. Take daily for 21 days then do not take for 7 days.  30 tablet  11  . fexofenadine (ALLEGRA) 180 MG tablet Take 180 mg by mouth daily.      . folic acid (FOLVITE) 1 MG tablet Take 1 mg by mouth daily.       . hydrochlorothiazide (HYDRODIURIL) 25 MG tablet Take 25 mg by mouth 3 (three) times a week.       [name] ibuprofen (ADVIL,MOTRIN) 100 MG tablet Take 500 mg by mouth as needed for fever.      [name] JANUMET 50-500 MG per tablet Take 1 tablet by mouth Twice daily.      . Methotrexate Sodium (METHOTREXATE PO) Take by mouth every 7 (seven) days.      . metoprolol (LOPRESSOR) 50 MG tablet Take 1 tablet (50 mg total) by mouth 2 (two) times daily.  60 tablet  12  . mometasone-formoterol (DULERA) 100-5 MCG/ACT AERO Inhale 2 puffs into the lungs 2 (two) times daily.        . NON FORMULARY Prednisone Eye Drop Left Eye Every Two Hrs or PRN      . ONE TOUCH ULTRA TEST test strip        . predniSONE (DELTASONE) 10 MG tablet Take 5 tabs 13 hours prior to test Take 5 tabs 7 hours prior to test Take 5 tabs 1 hour prior to test with 50 mg Benadryl  15 tablet  0  . promethazine-codeine (PHENERGAN WITH CODEINE) 6.25-10 MG/5ML syrup Take 5 mLs by mouth every 6 (six) hours as needed for cough.  200 mL  0  . Travoprost (TRAVATAN OP) Place 1 drop into both eyes at bedtime.        No current facility-administered medications on file prior to visit.   Last OV: 05/10/12 Next OV: 07/08/12

## 2012-05-14 NOTE — Telephone Encounter (Signed)
Per Dr. Maple Hudson: wait until tomorrow.  I called, spoke with pt.  Informed her of CDY's recs.  She verbalized understanding of this and voiced no further questions or concerns at this time.

## 2012-05-15 NOTE — Assessment & Plan Note (Signed)
I don't think her back pain is from sarcoid, but etiology is not defined. I recommended chest CT with contrast to look at what we could tell of her spine but also mediastinum and lung parenchyma. She has some history of shellfish allergy and also claustrophobia. She has tolerated imaging before with a Benadryl regimen. Plan-CT chest with contrast and with Benadryl prophylaxis protocol

## 2012-05-15 NOTE — Assessment & Plan Note (Signed)
What she is describing today is more like thoracic spine pain with bilateral radiation. Plan-CT chest. She may later need CT spine.

## 2012-05-19 ENCOUNTER — Telehealth: Payer: Self-pay | Admitting: Internal Medicine

## 2012-05-19 NOTE — Telephone Encounter (Signed)
Result Note    CT chest does not show any clear evidence of sarcoid. There is some hardening of the arteries, but can't tell that it is important.   There are some very small nodules. The radiologist recommends we look again at them in 3 months. We will discuss at next ov   I spoke with patient about results and she verbalized understanding and had no questions.

## 2012-05-19 NOTE — Progress Notes (Signed)
Quick Note:  Raford Pitcher, RN at 05/14/2012 11:30 AM   Status: Signed     Per Dr. Maple Hudson: wait until tomorrow.   I called, spoke with pt. Informed her of CDY's recs. She verbalized understanding of this and voiced no further questions or concerns at this time.    ______

## 2012-05-19 NOTE — Progress Notes (Signed)
Quick Note:  LMTCB ______ 

## 2012-05-24 ENCOUNTER — Telehealth: Payer: Self-pay | Admitting: Cardiovascular Disease

## 2012-05-24 NOTE — Telephone Encounter (Signed)
New Prob   Pt had a scan done last week by Dr. Maple Hudson and he found some hardening of her arteries. Was told to contact cardiologist. Please call.

## 2012-05-24 NOTE — Telephone Encounter (Signed)
Reviewed her chest ct with her, explained that the test is merely stating she has calcium build up and that it accumulates from a young age through our life. Explained she is on cholesterol meds and bp meds to slow this build up. Informed her she is being medically treated for this. Pt denies cp. Pt had testing for mid back pain that she has had for several years. Advised her to f/u with pcp to send her else where since pulmonology reasons for her sx were not found. Pt was happy with phone conversation and was told to call back with further questions or concerns.

## 2012-06-01 ENCOUNTER — Other Ambulatory Visit: Payer: Self-pay | Admitting: *Deleted

## 2012-06-08 ENCOUNTER — Other Ambulatory Visit: Payer: Self-pay | Admitting: *Deleted

## 2012-06-08 MED ORDER — ATORVASTATIN CALCIUM 20 MG PO TABS: 20.0000 mg | ORAL_TABLET | Freq: Every day | ORAL | Status: DC

## 2012-06-08 NOTE — Telephone Encounter (Signed)
Fax Received. Refill Completed. Sheila Reynolds (R.M.A)   

## 2012-06-11 DIAGNOSIS — H4011X0 Primary open-angle glaucoma, stage unspecified: Secondary | ICD-10-CM | POA: Diagnosis not present

## 2012-06-11 DIAGNOSIS — H209 Unspecified iridocyclitis: Secondary | ICD-10-CM | POA: Diagnosis not present

## 2012-06-11 DIAGNOSIS — H2510 Age-related nuclear cataract, unspecified eye: Secondary | ICD-10-CM | POA: Diagnosis not present

## 2012-06-30 DIAGNOSIS — J3081 Allergic rhinitis due to animal (cat) (dog) hair and dander: Secondary | ICD-10-CM | POA: Diagnosis not present

## 2012-06-30 DIAGNOSIS — J3089 Other allergic rhinitis: Secondary | ICD-10-CM | POA: Diagnosis not present

## 2012-06-30 DIAGNOSIS — J301 Allergic rhinitis due to pollen: Secondary | ICD-10-CM | POA: Diagnosis not present

## 2012-06-30 DIAGNOSIS — J45909 Unspecified asthma, uncomplicated: Secondary | ICD-10-CM | POA: Diagnosis not present

## 2012-07-01 DIAGNOSIS — IMO0001 Reserved for inherently not codable concepts without codable children: Secondary | ICD-10-CM | POA: Diagnosis not present

## 2012-07-01 DIAGNOSIS — Z09 Encounter for follow-up examination after completed treatment for conditions other than malignant neoplasm: Secondary | ICD-10-CM | POA: Diagnosis not present

## 2012-07-01 DIAGNOSIS — D869 Sarcoidosis, unspecified: Secondary | ICD-10-CM | POA: Diagnosis not present

## 2012-07-01 DIAGNOSIS — J984 Other disorders of lung: Secondary | ICD-10-CM | POA: Diagnosis not present

## 2012-07-08 ENCOUNTER — Encounter: Payer: Self-pay | Admitting: Internal Medicine

## 2012-07-08 ENCOUNTER — Ambulatory Visit (INDEPENDENT_AMBULATORY_CARE_PROVIDER_SITE_OTHER): Payer: Medicare Other | Admitting: Internal Medicine

## 2012-07-08 ENCOUNTER — Other Ambulatory Visit: Payer: Self-pay

## 2012-07-08 VITALS — BP 122/64 | HR 65 | Ht 62.0 in | Wt 163.0 lb

## 2012-07-08 DIAGNOSIS — D869 Sarcoidosis, unspecified: Secondary | ICD-10-CM | POA: Diagnosis not present

## 2012-07-08 DIAGNOSIS — R918 Other nonspecific abnormal finding of lung field: Secondary | ICD-10-CM

## 2012-07-08 NOTE — Progress Notes (Signed)
02/05/11- 66 yoF former smoker seen on referral from Dr Estanislado Pandy for concern of sarcoid. 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 Shortness of breath has been chronic with one flight of stairs. She thinks she may be gradually getting worse. There is chronic dry cough which is been worse since a flulike illness 3 weeks ago. 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 I can't tell a specific arthritis diagnosis has been made. There is no history of liver or kidney disease. Bronchitis was diagnosed at the time of allergy skin testing a year ago. Food sensitivity to shrimp and crab which make her lips itch. Positive skin test also for grass and house dust. She has not been on allergy vaccine. A rescue inhaler has helped at times. 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 Advair was no help and Symbicort may have helped only a little. ACE 46 (8-52) 09/30/10 Medication choices been limited by glaucoma. No history of TB exposure. Past history of sinus infections. Active problem with heartburn which is partly suppressed with Nexium once or twice daily/ Dr Collene Mares. Married, now on short-term disability. Quit smoking 1980 Several family members on her father's side had sarcoid.  03/10/11- 66 yoF former smoker followed for bronchitis, hx sarcoid, complicated by GERD, glaucoma Dulera inhaler did not have significant improvement. She is off of methotrexate and prednisone as of this month. 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 She occasionally wakes coughing with thick white mucus, stress incontinence. Just occasionally tight. DOE with one flight of stairs and has to stop at the top to get her breath but  no longer has to catch her breath halfway down stairs carrying laundry. Tussionex no longer works as well for cough as it used to.  07/08/11- 65 yoF former smoker followed for bronchitis, hx sarcoid, complicated by GERD, glaucoma Iincreased cough and congestion(nasal and chest)Productive-green/yellow in color x 3 days. Denies any fever but has been having chills. SOB and wheezing along with cough. SOB is all the time. Need to check if okay to start back on Methtrexate again. Dr Estanislado Pandy has been following for sarcoid of the eye and wants to resume methotrexate. There has been increased cough, productive of  mucus, worse at night and supine. Maybe some chills. Stress incontinence. Arthralgias. Postnasal drip. GERD is fairly well controlled with Nexium. Taking tramadol now for pain so is not helping the cough. Failed benzonatate. This was a prolonged visit 40 minutes. CXR 02/09/11-images reviewed with her. IMPRESSION:  Stable examination. No active cardiopulmonary process.  Original Report Authenticated By: Vivia Ewing, M.D.    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  Will see Dr.Deveshwar next month for decision about restarting methotrexate. Persistent pain mid anterior chest through to back, worse with prolonged standing and kitchen, eased by leaning over. No reflux or dysphagia. Expects seasonal bronchitis but denies routine cough or dyspnea, fever or sweats. CXR 07/10/11- negative. ACE 07/08/11- 40/ WNL.  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   FOLLOWS FOR: still having problems with SOB with activity. No change in complaints of pain, mid anterior chest, left greater than right mostly around into her back and with prolonged standing she has to stop kitchen work to sit down. We discussed this as more of a back problem and a chest problem. She has seen Dr.Van Alvin Critchley w/ [facility] . Dr. Corliss Skains is treating with ibuprofen.  07/08/12- 34 yoF former smoker followed for bronchitis, hx sarcoid, complicated by GERD, glaucoma FOLLOWS FOR: went for ROV at [facility]-was told allergies were flared up and Deveshwar MD stated Sarcoid was flared up as well. Was given Rx's(currently on Prednisone taper) continues to cough-green and yellow with streaks of blood at times. (Dr Alvin Critchley) Complains of backache. Rhinitis is responded to nasal spray, Singulair, Allegra. Dr.Deveshwar feels for sarcoid might be flaring. She prescribed prednisone, methotrexate, folic acid. ACE level 07/08/11- 40 (8-52) CT chest 05/19/12 IMPRESSION:  . No explanation for the patient's history of back pain.  2. Despite the reported clinical history of sarcoidosis, there are  no imaging findings to suggest a diagnosis of sarcoidosis at this  time in this patient. Clinical correlation is recommended.  3. Mild centrilobular emphysema.  4. Atherosclerosis, including left anterior descending coronary  artery disease. Please note that although the presence of coronary  artery calcium documents the presence of coronary artery disease,  the severity of this disease and any potential stenosis cannot be  assessed on this non-gated CT examination. Assessment for  potential risk factor modification, dietary therapy or  pharmacologic therapy may be warranted, if clinically indicated.  5. Small pulmonary nodules in the lungs, as above. The largest of  these is a 6 mm ground-glass attenuation nodule in the right upper  lobe (image 16 of series 3). Initial follow-up by  chest CT without  contrast is recommended in 3 months to confirm persistence. This  recommendation follows the consensus statement: Recommendations for  the Management of Subsolid Pulmonary Nodules Detected at CT: A  Statement from the Fleischner Society as published in Radiology  2013; 266:304-317.  Original Report Authenticated By: Trudie Reed, M.D.  ROS-see HPI Constitutional:   No-   weight loss, night sweats, fevers, chills, fatigue, lassitude. HEENT:   No-  headaches, difficulty swallowing, tooth/dental problems, sore throat,       Some  sneezing, itching, ear ache, +nasal congestion, post nasal drip,  CV:  + chest pain, no-orthopnea, PND, swelling in lower extremities, anasarca, dizziness, palpitations Resp: No-  shortness of breath with exertion not at rest.              No-   productive cough,  No non-productive cough,  No- coughing up of blood.                No-change in color of mucus.  No- wheezing.   Skin: No-   rash or lesions. GI:  No-   heartburn, indigestion, abdominal pain, nausea, vomiting,  GU:  MS:  +  joint pain or swelling, + back pain Neuro-     nothing unusual Psych:  No- change in mood or affect. No depression or anxiety.  No memory loss.  OBJ General- Alert, Oriented, Affect-appropriate, Distress- none acute Skin- rash-none, lesions- none, excoriation- none Lymphadenopathy- none Head- atraumatic            Eyes- Gross vision intact,  PERRLA, conjunctivae clear secretions- not injected            Ears- Hearing aid Left            Nose- sniffing, no-Septal dev, mucus, polyps, erosion, perforation             Throat- Mallampati II , mucosa clear , drainage- none, tonsils- atrophic Neck- flexible , trachea midline, no stridor , thyroid nl, carotid no bruit Chest - symmetrical excursion , unlabored           Heart/CV- RRR , no murmur , no gallop  , no rub, nl s1 s2                           - JVD- none , edema- none, stasis changes- none, varices- none            Lung- no-cough, no wheeze , dullness-none, rub- none           Chest wall-  Abd-  Br/ Gen/ Rectal- Not done, not indicated Extrem- cyanosis- none, clubbing, none, atrophy- none, strength- nl Neuro- grossly intact to observation

## 2012-07-08 NOTE — Patient Instructions (Addendum)
Order- future schedule CT chest, no contrast    Dx lung nodules  Order- lab- ACE level   Dx Sarcoid

## 2012-07-18 DIAGNOSIS — R918 Other nonspecific abnormal finding of lung field: Secondary | ICD-10-CM | POA: Insufficient documentation

## 2012-07-18 NOTE — Assessment & Plan Note (Signed)
Plan- we agreed to followup chest CT in 3 months (July, 2014)

## 2012-07-18 NOTE — Assessment & Plan Note (Signed)
Current CT scan shows only some small nonspecific nodules, for followup in 3 months. Plan-ACE level

## 2012-07-21 NOTE — Progress Notes (Signed)
Quick Note:  Pt aware of results. ______ 

## 2012-07-26 DIAGNOSIS — H524 Presbyopia: Secondary | ICD-10-CM | POA: Diagnosis not present

## 2012-07-26 DIAGNOSIS — H2510 Age-related nuclear cataract, unspecified eye: Secondary | ICD-10-CM | POA: Diagnosis not present

## 2012-07-26 DIAGNOSIS — H20019 Primary iridocyclitis, unspecified eye: Secondary | ICD-10-CM | POA: Diagnosis not present

## 2012-07-26 DIAGNOSIS — H4011X0 Primary open-angle glaucoma, stage unspecified: Secondary | ICD-10-CM | POA: Diagnosis not present

## 2012-08-18 ENCOUNTER — Ambulatory Visit (INDEPENDENT_AMBULATORY_CARE_PROVIDER_SITE_OTHER)
Admission: RE | Admit: 2012-08-18 | Discharge: 2012-08-18 | Disposition: A | Discharge status: Home / Self Care | Payer: Medicare Other | Source: Ambulatory Visit | Attending: Internal Medicine | Admitting: Internal Medicine

## 2012-08-18 DIAGNOSIS — R918 Other nonspecific abnormal finding of lung field: Secondary | ICD-10-CM

## 2012-08-18 DIAGNOSIS — R911 Solitary pulmonary nodule: Secondary | ICD-10-CM | POA: Diagnosis not present

## 2012-08-19 ENCOUNTER — Ambulatory Visit: Payer: Medicare Other | Admitting: Internal Medicine

## 2012-08-19 ENCOUNTER — Ambulatory Visit (INDEPENDENT_AMBULATORY_CARE_PROVIDER_SITE_OTHER): Payer: Medicare Other | Admitting: Internal Medicine

## 2012-08-19 ENCOUNTER — Encounter: Payer: Self-pay | Admitting: Internal Medicine

## 2012-08-19 VITALS — BP 142/84 | HR 79 | Ht 62.0 in | Wt 166.6 lb

## 2012-08-19 DIAGNOSIS — R918 Other nonspecific abnormal finding of lung field: Secondary | ICD-10-CM | POA: Diagnosis not present

## 2012-08-19 DIAGNOSIS — Z862 Personal history of diseases of the blood and blood-forming organs and certain disorders involving the immune mechanism: Secondary | ICD-10-CM | POA: Diagnosis not present

## 2012-08-19 DIAGNOSIS — R0989 Other specified symptoms and signs involving the circulatory and respiratory systems: Secondary | ICD-10-CM | POA: Diagnosis not present

## 2012-08-19 DIAGNOSIS — R0609 Other forms of dyspnea: Secondary | ICD-10-CM | POA: Diagnosis not present

## 2012-08-19 DIAGNOSIS — Z8639 Personal history of other endocrine, nutritional and metabolic disease: Secondary | ICD-10-CM | POA: Diagnosis not present

## 2012-08-19 DIAGNOSIS — J40 Bronchitis, not specified as acute or chronic: Secondary | ICD-10-CM | POA: Diagnosis not present

## 2012-08-19 DIAGNOSIS — R0600 Dyspnea, unspecified: Secondary | ICD-10-CM

## 2012-08-19 NOTE — Progress Notes (Signed)
02/05/11- 66 yoF former smoker seen on referral from Dr Estanislado Pandy for concern of sarcoid. 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 Shortness of breath has been chronic with one flight of stairs. She thinks she may be gradually getting worse. There is chronic dry cough which is been worse since a flulike illness 3 weeks ago. 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 I can't tell a specific arthritis diagnosis has been made. There is no history of liver or kidney disease. Bronchitis was diagnosed at the time of allergy skin testing a year ago. Food sensitivity to shrimp and crab which make her lips itch. Positive skin test also for grass and house dust. She has not been on allergy vaccine. A rescue inhaler has helped at times. 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 Advair was no help and Symbicort may have helped only a little. ACE 46 (8-52) 09/30/10 Medication choices been limited by glaucoma. No history of TB exposure. Past history of sinus infections. Active problem with heartburn which is partly suppressed with Nexium once or twice daily/ Dr Collene Mares. Married, now on short-term disability. Quit smoking 1980 Several family members on her father's side had sarcoid.  03/10/11- 66 yoF former smoker followed for bronchitis, hx sarcoid, complicated by GERD, glaucoma Dulera inhaler did not have significant improvement. She is off of methotrexate and prednisone as of this month. 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 She occasionally wakes coughing with thick white mucus, stress incontinence. Just occasionally tight. DOE with one flight of stairs and has to stop at the top to get her breath but  no longer has to catch her breath halfway down stairs carrying laundry. Tussionex no longer works as well for cough as it used to.  07/08/11- 65 yoF former smoker followed for bronchitis, hx sarcoid, complicated by GERD, glaucoma Iincreased cough and congestion(nasal and chest)Productive-green/yellow in color x 3 days. Denies any fever but has been having chills. SOB and wheezing along with cough. SOB is all the time. Need to check if okay to start back on Methtrexate again. Dr Estanislado Pandy has been following for sarcoid of the eye and wants to resume methotrexate. There has been increased cough, productive of  mucus, worse at night and supine. Maybe some chills. Stress incontinence. Arthralgias. Postnasal drip. GERD is fairly well controlled with Nexium. Taking tramadol now for pain so is not helping the cough. Failed benzonatate. This was a prolonged visit 40 minutes. CXR 02/09/11-images reviewed with her. IMPRESSION:  Stable examination. No active cardiopulmonary process.  Original Report Authenticated By: Vivia Ewing, M.D.    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  Will see Dr.Deveshwar next month for decision about restarting methotrexate. Persistent pain mid anterior chest through to back, worse with prolonged standing and kitchen, eased by leaning over. No reflux or dysphagia. Expects seasonal bronchitis but denies routine cough or dyspnea, fever or sweats. CXR 07/10/11- negative. ACE 07/08/11- 40/ WNL.  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   FOLLOWS FOR: still having problems with SOB with activity. No change in complaints of pain, mid anterior chest, left greater than right mostly around into her back and with prolonged standing she has to stop kitchen work to sit down. We discussed this as more of a back problem and a chest problem. She has seen Dr.Van Alvin Critchley w/ [facility] . Dr. Corliss Skains is treating with ibuprofen.  07/08/12- 49 yoF former smoker followed for bronchitis, hx sarcoid, complicated by GERD, glaucoma FOLLOWS FOR: went for ROV at [facility]-was told allergies were flared up and Deveshwar MD stated Sarcoid was flared up as well. Was given Rx's(currently on Prednisone taper) continues to cough-green and yellow with streaks of blood at times. (Dr Alvin Critchley) Complains of backache. Rhinitis is responded to nasal spray, Singulair, Allegra. Dr.Deveshwar feels for sarcoid might be flaring. She prescribed prednisone, methotrexate, folic acid. ACE level 07/08/11- 40 (8-52) CT chest 05/19/12 IMPRESSION:  . No explanation for the patient's history of back pain.  2. Despite the reported clinical history of sarcoidosis, there are  no imaging findings to suggest a diagnosis of sarcoidosis at this  time in this patient. Clinical correlation is recommended.  3. Mild centrilobular emphysema.  4. Atherosclerosis, including left anterior descending coronary  artery disease. Please note that although the presence of coronary  artery calcium documents the presence of coronary artery disease,  the severity of this disease and any potential stenosis cannot be  assessed on this non-gated CT examination. Assessment for  potential risk factor modification, dietary therapy or  pharmacologic therapy may be warranted, if clinically indicated.  5. Small pulmonary nodules in the lungs, as above. The largest of  these is a 6 mm ground-glass attenuation nodule in the right upper  lobe (image 16 of series 3). Initial follow-up by  chest CT without  contrast is recommended in 3 months to confirm persistence. This  recommendation follows the consensus statement: Recommendations for  the Management of Subsolid Pulmonary Nodules Detected at CT: A  Statement from the Fleischner Society as published in Radiology  2013; 266:304-317.  Original Report Authenticated By: Trudie Reed, M.D.  08/19/12- 66 yoF former smoker followed for bronchitis, hx sarcoid, complicated by GERD, glaucoma follows for:  discuss ct results  Occasional sweats, minor rash at right ankle, cough productive white, thick and occasionally yellow sputum. Some dyspnea with exertion, no chest pain or palpitation. ACE 07/08/12- 27 (8-52) CT chest 08/18/12 IMPRESSION:  1. New clustered peribronchovascular ill-defined nodularity in the  right upper lobe, indicative of an infectious bronchiolitis.  2. Previously measured 6 mm right upper lobe nodule is less  discrete on the current examination.  3. Coronary artery calcification.  Original Report Authenticated By: Leanna Battles, M.D.  ROS-see HPI Constitutional:   No-   weight loss, +night sweats, fevers, chills, fatigue, lassitude. HEENT:   No-  headaches, difficulty swallowing, tooth/dental problems, sore throat,       Some  sneezing, itching, ear ache, +nasal congestion, post nasal drip,  CV:  No-chest pain, no-orthopnea, PND, swelling in lower extremities, anasarca, dizziness, palpitations Resp: +  shortness of breath with exertion not at rest.              No-   productive cough,  No non-productive cough,  No- coughing up of blood.  No-change in color of mucus.  No- wheezing.   Skin: No-   rash or lesions. GI:  No-   heartburn, indigestion, abdominal pain, nausea, vomiting,  GU:  MS:  +  joint pain or swelling, + back pain Neuro-     nothing unusual Psych:  No- change in mood or affect. No depression or anxiety.  No memory loss.  OBJ General- Alert, Oriented, Affect anxious,  Distress- none acute Skin- rash-none, lesions- none, excoriation- none Lymphadenopathy- none Head- atraumatic            Eyes- Gross vision intact, PERRLA, conjunctivae clear secretions- not injected            Ears- Hearing aid Left            Nose- sniffing, no-Septal dev, mucus, polyps, erosion, perforation             Throat- Mallampati II , mucosa clear , drainage- none, tonsils- atrophic Neck- flexible , trachea midline, no stridor , thyroid nl, carotid no bruit Chest - symmetrical excursion , unlabored           Heart/CV- RRR , no murmur , no gallop  , no rub, nl s1 s2                           - JVD- none , edema- none, stasis changes- none, varices- none           Lung- clear,no-cough, no wheeze , dullness-none, rub- none           Chest wall-  Abd-  Br/ Gen/ Rectal- Not done, not indicated Extrem- cyanosis- none, clubbing, none, atrophy- none, strength- nl Neuro- grossly intact to observation

## 2012-08-19 NOTE — Patient Instructions (Addendum)
Order- future CT chest, no contrast  For next visit    Dx lung nodules, hx sarcoid  Order- Schedule  PFT and 6 MWT    Dx dyspnea, hx sarcoid  Order- lab- Sputum for C&S, fungal and AFB smear and culture

## 2012-08-24 ENCOUNTER — Encounter: Payer: Self-pay | Admitting: Nurse Practitioner

## 2012-08-24 ENCOUNTER — Other Ambulatory Visit: Payer: Medicare Other

## 2012-08-24 ENCOUNTER — Ambulatory Visit (INDEPENDENT_AMBULATORY_CARE_PROVIDER_SITE_OTHER): Payer: Medicare Other | Admitting: Nurse Practitioner

## 2012-08-24 VITALS — BP 120/68 | HR 80 | Ht 62.0 in | Wt 163.1 lb

## 2012-08-24 DIAGNOSIS — Z862 Personal history of diseases of the blood and blood-forming organs and certain disorders involving the immune mechanism: Secondary | ICD-10-CM

## 2012-08-24 DIAGNOSIS — E785 Hyperlipidemia, unspecified: Secondary | ICD-10-CM | POA: Diagnosis not present

## 2012-08-24 DIAGNOSIS — I259 Chronic ischemic heart disease, unspecified: Secondary | ICD-10-CM | POA: Diagnosis not present

## 2012-08-24 DIAGNOSIS — R0600 Dyspnea, unspecified: Secondary | ICD-10-CM

## 2012-08-24 DIAGNOSIS — R0989 Other specified symptoms and signs involving the circulatory and respiratory systems: Secondary | ICD-10-CM | POA: Diagnosis not present

## 2012-08-24 DIAGNOSIS — R0609 Other forms of dyspnea: Secondary | ICD-10-CM | POA: Diagnosis not present

## 2012-08-24 NOTE — Patient Instructions (Addendum)
We will do fasting labs on the day of your stress test  We are going to arrange a stress test (Lexiscan)  Stay on your current medicines  Call the [facility] office at [phone] if you have any questions, problems or concerns.

## 2012-08-24 NOTE — Progress Notes (Signed)
Sheila Reynolds Date of Birth: 1945/09/27 Medical Record #[id number]  History of Present Illness: [name] is seen back today for a 6 month check. Seen for Dr. Elease Hashimoto. She has known CAD with mild to moderate CAD per cath back in 2009. 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 She has chronic DOE.   Comes in today. Here alone. Continues to have DOE. Continues to have palpitations. Also endorses chest pain with her "flutters". This seems to be unchanged. No syncope. Not really dizzy or lightheaded. Notes that her left breast will be sore to touch. She is worried about a CT from March that showed "hardening of the arteries". Does have known CAD with no recent testing. Some edema in her ankles. Salt use is questionable. Not exercising. Says she deals with depression and just "doesn't feel like it". Sugars are up and down.  Current Outpatient Prescriptions  Medication Sig Dispense Refill  . Albuterol Sulfate (VENTOLIN HFA IN) Inhale into the lungs.      . ALPRAZolam (XANAX) 0.5 MG tablet Take 0.25 mg by mouth 2 (two) times daily as needed. For sleep      . aspirin 81 MG tablet Take 81 mg by mouth daily.        [name] atorvastatin (LIPITOR) 20 MG tablet Take 1 tablet (20 mg total) by mouth daily.  30 tablet  5  . brinzolamide (AZOPT) 1 % ophthalmic suspension Place 1 drop into both eyes 3 (three) times daily.       [name] esomeprazole (NEXIUM) 40 MG capsule Take 40 mg by mouth daily.        [name] estrogens, conjugated, (PREMARIN) 0.3 MG tablet Take 1 tablet (0.3 mg total) by mouth daily. Take daily for 21 days then do not take for 7 days.  30 tablet  11  . fexofenadine (ALLEGRA) 180 MG tablet Take 180 mg by mouth daily.      . folic acid (FOLVITE) 1 MG tablet Take 2 mg by mouth daily.       . hydrochlorothiazide (HYDRODIURIL) 25 MG tablet Take 25 mg by mouth 3 (three) times a week.       [name] JANUMET 50-500 MG per tablet Take 1 tablet by mouth Twice daily.      .  Methotrexate Sodium (METHOTREXATE PO) Take 8 tablets by mouth every 7 days      . metoprolol (LOPRESSOR) 50 MG tablet Take 1 tablet (50 mg total) by mouth 2 (two) times daily.  60 tablet  12  . mometasone-formoterol (DULERA) 100-5 MCG/ACT AERO Inhale 2 puffs into the lungs 2 (two) times daily.        . montelukast (SINGULAIR) 10 MG tablet Take 10 mg by mouth at bedtime.      . NON FORMULARY Prednisone Eye Drop Left Eye Every Two Hrs or PRN      . ONE TOUCH ULTRA TEST test strip 1 each by Other route as needed.       . Travoprost (TRAVATAN OP) Place 1 drop into both eyes at bedtime.        No current facility-administered medications for this visit.    Allergies  Allergen Reactions  . Crestor (Rosuvastatin Calcium) Other (See Comments)    muscle aches  . Shellfish Allergy     Crab, shrimp and lobster ---lips itch and tingle    Past Medical History  Diagnosis Date  . Hyperlipidemia   . Chest pain   . Nonsustained ventricular  tachycardia   . Fibromyalgia   . Coronary artery disease     Mild to moderate CAD  . Palpitations     Past Surgical History  Procedure Laterality Date  . Cardiac catheterization  05/04/2007    reveals overall normal left ventricular systolic function. Ejection fraction 65-70%  . Breast surgery  approx 1992    bilateral breast surgery for fibrocystic breast disease  . Total abdominal hysterectomy  1982    History  Smoking status  . Former Smoker -- 1.00 packs/day for 4 years  . Types: Cigarettes  . Quit date: 02/11/1980  Smokeless tobacco  . Not on file    History  Alcohol Use No    Family History  Problem Relation Age of Onset  . Heart disease Father   . Heart disease Paternal Grandmother   . Cancer Mother     unknown type, ?lung  . Cancer Paternal Grandmother     unknown type  . Diabetes Father   . Diabetes Brother   . Glaucoma Father   . Glaucoma Brother     Review of Systems: The review of systems is per the HPI.  All other  systems were reviewed and are negative.  Physical Exam: BP 120/68  Pulse 80  Ht 5\' 2"  (1.575 m)  Wt 163 lb 1.9 oz (73.991 kg)  BMI 29.83 kg/m2  SpO2 100% Patient is very pleasant and in no acute distress. Her affect is flat. Skin is warm and dry. Color is normal.  HEENT is unremarkable. Normocephalic/atraumatic. PERRL. Sclera are nonicteric. Neck is supple. No masses. No JVD. Lungs are clear. Cardiac exam shows a regular rate and rhythm. Abdomen is soft. Extremities are with just minor/trace edema. Gait and ROM are intact. No gross neurologic deficits noted.  LABORATORY DATA: Not fasting today  Lab Results  Component Value Date   WBC 11.1* 02/09/2011   HGB 13.1 02/09/2011   HCT 39.0 02/09/2011   PLT 238 02/09/2011   GLUCOSE 92 05/10/2012   CHOL 127 03/11/2012   TRIG 91.0 03/11/2012   HDL 40.20 03/11/2012   LDLCALC 69 03/11/2012   ALT 18 03/11/2012   AST 19 03/11/2012   NA 137 05/10/2012   K 4.3 05/10/2012   CL 101 05/10/2012   CREATININE 0.7 05/10/2012   BUN 13 05/10/2012   CO2 29 05/10/2012   TSH 0.88 09/24/2010     Assessment / Plan: 1. Chest pain - has multiple CV risk factors with known CAD - will arrange for Lexiscan. She is quite limited by back pain - I do not think she can walk on a treadmill adequately.   2. HLD - not fasting today - will check labs on the day of her study  3. DM  4. HTN - BP is good today  5. Chronic DOE - sarcoid - followed by Dr. Maple Hudson and has PFTs and a 6 minute walk planned for early August.  6. Swelling - needs to limit salt and use support stockings. I would not change her medicines at this time.   Further disposition to follow in regards to return visit.   Patient is agreeable to this plan and will call if any problems develop in the interim.   Rosalio Macadamia, RN, ANP-C [facility] [address] [place], [place]  [address]

## 2012-08-25 DIAGNOSIS — H20019 Primary iridocyclitis, unspecified eye: Secondary | ICD-10-CM | POA: Diagnosis not present

## 2012-08-25 DIAGNOSIS — H2510 Age-related nuclear cataract, unspecified eye: Secondary | ICD-10-CM | POA: Diagnosis not present

## 2012-08-25 DIAGNOSIS — H4011X0 Primary open-angle glaucoma, stage unspecified: Secondary | ICD-10-CM | POA: Diagnosis not present

## 2012-08-27 LAB — RESPIRATORY CULTURE OR RESPIRATORY AND SPUTUM CULTURE
Gram Stain: NONE SEEN
Organism ID, Bacteria: NORMAL

## 2012-08-31 ENCOUNTER — Ambulatory Visit: Payer: Self-pay | Admitting: Nurse Practitioner

## 2012-09-01 ENCOUNTER — Other Ambulatory Visit (INDEPENDENT_AMBULATORY_CARE_PROVIDER_SITE_OTHER): Payer: Medicare Other

## 2012-09-01 ENCOUNTER — Ambulatory Visit (HOSPITAL_COMMUNITY): Payer: Medicare Other | Attending: Cardiology | Admitting: Radiology

## 2012-09-01 VITALS — BP 132/80 | HR 70 | Ht 62.0 in | Wt 160.0 lb

## 2012-09-01 DIAGNOSIS — R110 Nausea: Secondary | ICD-10-CM

## 2012-09-01 DIAGNOSIS — E119 Type 2 diabetes mellitus without complications: Secondary | ICD-10-CM | POA: Insufficient documentation

## 2012-09-01 DIAGNOSIS — R0609 Other forms of dyspnea: Secondary | ICD-10-CM | POA: Insufficient documentation

## 2012-09-01 DIAGNOSIS — R5381 Other malaise: Secondary | ICD-10-CM | POA: Diagnosis not present

## 2012-09-01 DIAGNOSIS — Z87891 Personal history of nicotine dependence: Secondary | ICD-10-CM | POA: Diagnosis not present

## 2012-09-01 DIAGNOSIS — R42 Dizziness and giddiness: Secondary | ICD-10-CM | POA: Insufficient documentation

## 2012-09-01 DIAGNOSIS — R5383 Other fatigue: Secondary | ICD-10-CM | POA: Insufficient documentation

## 2012-09-01 DIAGNOSIS — R079 Chest pain, unspecified: Secondary | ICD-10-CM

## 2012-09-01 DIAGNOSIS — R002 Palpitations: Secondary | ICD-10-CM | POA: Insufficient documentation

## 2012-09-01 DIAGNOSIS — I2510 Atherosclerotic heart disease of native coronary artery without angina pectoris: Secondary | ICD-10-CM | POA: Diagnosis not present

## 2012-09-01 DIAGNOSIS — I10 Essential (primary) hypertension: Secondary | ICD-10-CM | POA: Diagnosis not present

## 2012-09-01 DIAGNOSIS — R0989 Other specified symptoms and signs involving the circulatory and respiratory systems: Secondary | ICD-10-CM | POA: Insufficient documentation

## 2012-09-01 DIAGNOSIS — R0602 Shortness of breath: Secondary | ICD-10-CM | POA: Diagnosis not present

## 2012-09-01 DIAGNOSIS — E785 Hyperlipidemia, unspecified: Secondary | ICD-10-CM

## 2012-09-01 DIAGNOSIS — Z8249 Family history of ischemic heart disease and other diseases of the circulatory system: Secondary | ICD-10-CM | POA: Diagnosis not present

## 2012-09-01 DIAGNOSIS — R51 Headache: Secondary | ICD-10-CM

## 2012-09-01 LAB — BASIC METABOLIC PANEL
BUN: 12 mg/dL (ref 6–23)
CO2: 26 mEq/L (ref 19–32)
Calcium: 9 mg/dL (ref 8.4–10.5)
Chloride: 104 mEq/L (ref 96–112)
Creatinine, Ser: 0.7 mg/dL (ref 0.4–1.2)
GFR: 107.95 mL/min (ref >60.00)
Glucose, Bld: 106 mg/dL — ABNORMAL HIGH (ref 70–99)
Potassium: 4.3 mEq/L (ref 3.5–5.1)
Sodium: 138 mEq/L (ref 135–145)

## 2012-09-01 LAB — HEPATIC FUNCTION PANEL
ALT: 15 U/L (ref 0–35)
AST: 15 U/L (ref 0–37)
Albumin: 3.9 g/dL (ref 3.5–5.2)
Alkaline Phosphatase: 87 U/L (ref 39–117)
Bilirubin, Direct: 0 mg/dL (ref 0.0–0.3)
Total Bilirubin: 0.6 mg/dL (ref 0.3–1.2)
Total Protein: 6.9 g/dL (ref 6.0–8.3)

## 2012-09-01 LAB — LIPID PANEL
Cholesterol: 112 mg/dL (ref 0–200)
HDL: 37.7 mg/dL — ABNORMAL LOW (ref >39.00)
LDL Cholesterol: 58 mg/dL (ref 0–99)
Total CHOL/HDL Ratio: 3
Triglycerides: 83 mg/dL (ref 0.0–149.0)
VLDL: 16.6 mg/dL (ref 0.0–40.0)

## 2012-09-01 MED ORDER — AMINOPHYLLINE 25 MG/ML IV SOLN
75.0000 mg | Freq: Once | INTRAVENOUS | Status: AC
  Administered 2012-09-01: 75 mg via INTRAVENOUS

## 2012-09-01 MED ORDER — TECHNETIUM TC 99M SESTAMIBI GENERIC - CARDIOLITE
10.0000 | Freq: Once | INTRAVENOUS | Status: AC | PRN
  Administered 2012-09-01: 10 via INTRAVENOUS

## 2012-09-01 MED ORDER — TECHNETIUM TC 99M SESTAMIBI GENERIC - CARDIOLITE
30.0000 | Freq: Once | INTRAVENOUS | Status: AC | PRN
  Administered 2012-09-01: 30 via INTRAVENOUS

## 2012-09-01 MED ORDER — REGADENOSON 0.4 MG/5ML IV SOLN
0.4000 mg | Freq: Once | INTRAVENOUS | Status: AC
  Administered 2012-09-01: 0.4 mg via INTRAVENOUS

## 2012-09-01 NOTE — Progress Notes (Signed)
[facility] SITE 3 NUCLEAR MED [address] [place], [place] [address] [phone]    Cardiology Nuclear Med Study  Sheila Reynolds is a 67 y.o. female     MRN : [id number]     DOB: 1945/10/21  Procedure Date: 09/01/2012  Nuclear Med Background Indication for Stress Test:  Evaluation for Ischemia History:  '04 Myocardial Perfusion Study-normal, EF=67%; '09 Cath: Mild-moderate CAD, EF=70%; 10-10-10 Echo: EF=60-65%; and 06-2012 CT: Cardiac Calcification; NSVT Cardiac Risk Factors: Family History - CAD, History of Smoking, Hypertension, Lipids and NIDDM  Symptoms: Chest Pain with/without exertion; DOE, Fatigue, Fatigue with Exertion, Light-Headedness and Palpitations   Nuclear Pre-Procedure Caffeine/Decaff Intake:  None NPO After: 10:30pm   Lungs:  clear O2 Sat: 98% on room air. IV 0.9% NS with Angio Cath:  22g  IV Site: L Antecubital  IV Started by:  Bonnita Levan, RN  Chest Size (in): 36; Bilateral Mastectomy with implants Cup Size: B  Height: 5\' 2"  (1.575 m)  Weight:  160 lb (72.576 kg)  BMI:  Body mass index is 29.26 kg/(m^2). Tech Comments:  Aminophylline 75 mg IVP given post recovery due to persistent headache with nausea with quick improvement of symptoms. Irean Hong, RN     Nuclear Med Study 1 or 2 day study: 1 day  Stress Test Type:  Eugenie Birks  Reading MD: Willa Rough, MD  Order Authorizing Provider:  Kristeen Miss, MD  Resting Radionuclide: Technetium 59m Sestamibi  Resting Radionuclide Dose: 10.8 mCi   Stress Radionuclide:  Technetium 47m Sestamibi  Stress Radionuclide Dose: 33.0 mCi           Stress Protocol Rest HR: 70 Stress HR: 109  Rest BP: 132/80 Stress BP: 155/78  Exercise Time (min): n/a METS: n/a   Predicted Max HR: 155 bpm % Max HR: 70.32 bpm Rate Pressure Product: 13244   Dose of Adenosine (mg):  n/a Dose of Lexiscan: 0.4 mg  Dose of Atropine (mg): n/a Dose of Dobutamine: n/a mcg/kg/min (at max HR)  Stress Test Technologist: Irean Hong, RN  Nuclear Technologist:  Domenic Polite, CNMT     Rest Procedure:  Myocardial perfusion imaging was performed at rest 45 minutes following the intravenous administration of Technetium 56m Sestamibi. Rest ECG: NSR - Normal EKG  Stress Procedure:  The patient received IV Lexiscan 0.4 mg over 15-seconds.  Technetium 26m Sestamibi injected at 30-seconds. The patient complained of headache, nausea, and chest pain. Quantitative spect images were obtained after a 45 minute delay. Stress ECG: No significant change from baseline ECG  QPS Raw Data Images:  Normal; no motion artifact; normal heart/lung ratio. Stress Images:  Normal homogeneous uptake in all areas of the myocardium. Rest Images:  Normal homogeneous uptake in all areas of the myocardium. Subtraction (SDS):  No evidence of ischemia. Transient Ischemic Dilatation (Normal <1.22):  n/a Lung/Heart Ratio (Normal <0.45):  0.36  Quantitative Gated Spect Images QGS EDV:  51 ml QGS ESV:  11 ml  Impression Exercise Capacity:  Lexiscan with no exercise. BP Response:  Normal blood pressure response. Clinical Symptoms:  Patient had headache and some nausea ECG Impression:  No significant ST segment change suggestive of ischemia. Comparison with Prior Nuclear Study: No images to compare  Overall Impression:  Normal stress nuclear study.   low risk scan  LV Ejection Fraction: 71%.  LV Wall Motion:  Normal Wall Motion  Willa Rough, MD

## 2012-09-02 NOTE — Assessment & Plan Note (Addendum)
Right upper lobe bronchiolitis on CT. Plan-sputum cultures PFT

## 2012-09-02 NOTE — Assessment & Plan Note (Addendum)
One area looks like active bronchiolitis while previous 6 mm nodule is less discrete on current chest CT Plan-continue watching as  ? active sarcoid- schedule followup chest CT in 6 months, PFT

## 2012-09-07 ENCOUNTER — Ambulatory Visit: Payer: Self-pay | Admitting: Internal Medicine

## 2012-09-15 ENCOUNTER — Ambulatory Visit: Payer: Medicare Other

## 2012-09-21 ENCOUNTER — Ambulatory Visit (INDEPENDENT_AMBULATORY_CARE_PROVIDER_SITE_OTHER): Payer: Medicare Other | Admitting: Internal Medicine

## 2012-09-21 DIAGNOSIS — R918 Other nonspecific abnormal finding of lung field: Secondary | ICD-10-CM

## 2012-09-21 DIAGNOSIS — R0602 Shortness of breath: Secondary | ICD-10-CM

## 2012-09-21 DIAGNOSIS — Z862 Personal history of diseases of the blood and blood-forming organs and certain disorders involving the immune mechanism: Secondary | ICD-10-CM

## 2012-09-21 DIAGNOSIS — D869 Sarcoidosis, unspecified: Secondary | ICD-10-CM | POA: Diagnosis not present

## 2012-09-21 LAB — PULMONARY FUNCTION TEST

## 2012-09-21 NOTE — Progress Notes (Signed)
PFT done today. 

## 2012-09-22 DIAGNOSIS — B373 Candidiasis of vulva and vagina: Secondary | ICD-10-CM | POA: Diagnosis not present

## 2012-09-22 DIAGNOSIS — B3731 Acute candidiasis of vulva and vagina: Secondary | ICD-10-CM | POA: Diagnosis not present

## 2012-09-22 LAB — FUNGUS CULTURE W SMEAR: Smear Result: NONE SEEN

## 2012-10-06 ENCOUNTER — Encounter: Payer: Self-pay | Admitting: Cardiovascular Disease

## 2012-10-06 ENCOUNTER — Encounter: Payer: Self-pay | Admitting: Cardiology

## 2012-10-06 LAB — AFB CULTURE WITH SMEAR (NOT AT ARMC)

## 2012-10-07 ENCOUNTER — Encounter: Payer: Self-pay | Admitting: Cardiovascular Disease

## 2012-10-07 NOTE — Progress Notes (Signed)
Documentation for 6 minute walk test 

## 2012-10-07 NOTE — Progress Notes (Signed)
Quick Note:  Spoke with pt and notified of results per Dr. Young. Pt verbalized understanding and denied any questions.  ______ 

## 2012-10-26 ENCOUNTER — Encounter: Payer: Self-pay | Admitting: Internal Medicine

## 2012-11-09 DIAGNOSIS — E1149 Type 2 diabetes mellitus with other diabetic neurological complication: Secondary | ICD-10-CM | POA: Diagnosis not present

## 2012-11-09 DIAGNOSIS — R635 Abnormal weight gain: Secondary | ICD-10-CM | POA: Diagnosis not present

## 2012-11-09 DIAGNOSIS — I998 Other disorder of circulatory system: Secondary | ICD-10-CM | POA: Diagnosis not present

## 2012-11-09 DIAGNOSIS — R0602 Shortness of breath: Secondary | ICD-10-CM | POA: Diagnosis not present

## 2012-11-09 DIAGNOSIS — E7800 Pure hypercholesterolemia, unspecified: Secondary | ICD-10-CM | POA: Diagnosis not present

## 2012-11-09 DIAGNOSIS — I10 Essential (primary) hypertension: Secondary | ICD-10-CM | POA: Diagnosis not present

## 2012-11-09 DIAGNOSIS — E1142 Type 2 diabetes mellitus with diabetic polyneuropathy: Secondary | ICD-10-CM | POA: Diagnosis not present

## 2012-11-09 DIAGNOSIS — R609 Edema, unspecified: Secondary | ICD-10-CM | POA: Diagnosis not present

## 2012-11-16 DIAGNOSIS — Z79899 Other long term (current) drug therapy: Secondary | ICD-10-CM | POA: Diagnosis not present

## 2012-11-16 DIAGNOSIS — I998 Other disorder of circulatory system: Secondary | ICD-10-CM | POA: Diagnosis not present

## 2012-11-16 DIAGNOSIS — E1159 Type 2 diabetes mellitus with other circulatory complications: Secondary | ICD-10-CM | POA: Diagnosis not present

## 2012-11-16 DIAGNOSIS — R609 Edema, unspecified: Secondary | ICD-10-CM | POA: Diagnosis not present

## 2012-11-16 DIAGNOSIS — E119 Type 2 diabetes mellitus without complications: Secondary | ICD-10-CM | POA: Diagnosis not present

## 2012-12-07 DIAGNOSIS — E559 Vitamin D deficiency, unspecified: Secondary | ICD-10-CM | POA: Diagnosis not present

## 2012-12-07 DIAGNOSIS — Z79899 Other long term (current) drug therapy: Secondary | ICD-10-CM | POA: Diagnosis not present

## 2012-12-07 DIAGNOSIS — F322 Major depressive disorder, single episode, severe without psychotic features: Secondary | ICD-10-CM | POA: Diagnosis not present

## 2012-12-07 DIAGNOSIS — R609 Edema, unspecified: Secondary | ICD-10-CM | POA: Diagnosis not present

## 2012-12-07 DIAGNOSIS — E1142 Type 2 diabetes mellitus with diabetic polyneuropathy: Secondary | ICD-10-CM | POA: Diagnosis not present

## 2012-12-07 DIAGNOSIS — E1149 Type 2 diabetes mellitus with other diabetic neurological complication: Secondary | ICD-10-CM | POA: Diagnosis not present

## 2012-12-07 DIAGNOSIS — N951 Menopausal and female climacteric states: Secondary | ICD-10-CM | POA: Diagnosis not present

## 2012-12-07 DIAGNOSIS — Z1331 Encounter for screening for depression: Secondary | ICD-10-CM | POA: Diagnosis not present

## 2012-12-07 DIAGNOSIS — Z006 Encounter for examination for normal comparison and control in clinical research program: Secondary | ICD-10-CM | POA: Diagnosis not present

## 2012-12-27 DIAGNOSIS — J301 Allergic rhinitis due to pollen: Secondary | ICD-10-CM | POA: Diagnosis not present

## 2012-12-27 DIAGNOSIS — J3089 Other allergic rhinitis: Secondary | ICD-10-CM | POA: Diagnosis not present

## 2012-12-27 DIAGNOSIS — J019 Acute sinusitis, unspecified: Secondary | ICD-10-CM | POA: Diagnosis not present

## 2012-12-27 DIAGNOSIS — J45909 Unspecified asthma, uncomplicated: Secondary | ICD-10-CM | POA: Diagnosis not present

## 2012-12-28 DIAGNOSIS — R1032 Left lower quadrant pain: Secondary | ICD-10-CM | POA: Diagnosis not present

## 2012-12-28 DIAGNOSIS — E1142 Type 2 diabetes mellitus with diabetic polyneuropathy: Secondary | ICD-10-CM | POA: Diagnosis not present

## 2012-12-28 DIAGNOSIS — N951 Menopausal and female climacteric states: Secondary | ICD-10-CM | POA: Diagnosis not present

## 2012-12-28 DIAGNOSIS — R141 Gas pain: Secondary | ICD-10-CM | POA: Diagnosis not present

## 2012-12-28 DIAGNOSIS — R152 Fecal urgency: Secondary | ICD-10-CM | POA: Diagnosis not present

## 2012-12-28 DIAGNOSIS — R198 Other specified symptoms and signs involving the digestive system and abdomen: Secondary | ICD-10-CM | POA: Diagnosis not present

## 2012-12-28 DIAGNOSIS — K219 Gastro-esophageal reflux disease without esophagitis: Secondary | ICD-10-CM | POA: Diagnosis not present

## 2012-12-28 DIAGNOSIS — M549 Dorsalgia, unspecified: Secondary | ICD-10-CM | POA: Diagnosis not present

## 2012-12-28 DIAGNOSIS — E1149 Type 2 diabetes mellitus with other diabetic neurological complication: Secondary | ICD-10-CM | POA: Diagnosis not present

## 2013-01-04 DIAGNOSIS — H20019 Primary iridocyclitis, unspecified eye: Secondary | ICD-10-CM | POA: Diagnosis not present

## 2013-01-11 DIAGNOSIS — Z91013 Allergy to seafood: Secondary | ICD-10-CM | POA: Diagnosis not present

## 2013-01-11 DIAGNOSIS — J019 Acute sinusitis, unspecified: Secondary | ICD-10-CM | POA: Diagnosis not present

## 2013-01-11 DIAGNOSIS — J45909 Unspecified asthma, uncomplicated: Secondary | ICD-10-CM | POA: Diagnosis not present

## 2013-01-11 DIAGNOSIS — Z86010 Personal history of colonic polyps: Secondary | ICD-10-CM | POA: Diagnosis not present

## 2013-01-11 DIAGNOSIS — J301 Allergic rhinitis due to pollen: Secondary | ICD-10-CM | POA: Diagnosis not present

## 2013-01-11 DIAGNOSIS — K5900 Constipation, unspecified: Secondary | ICD-10-CM | POA: Diagnosis not present

## 2013-01-11 DIAGNOSIS — K219 Gastro-esophageal reflux disease without esophagitis: Secondary | ICD-10-CM | POA: Diagnosis not present

## 2013-01-11 DIAGNOSIS — R141 Gas pain: Secondary | ICD-10-CM | POA: Diagnosis not present

## 2013-01-17 ENCOUNTER — Other Ambulatory Visit: Payer: Self-pay | Admitting: Cardiovascular Disease

## 2013-01-28 DIAGNOSIS — F322 Major depressive disorder, single episode, severe without psychotic features: Secondary | ICD-10-CM | POA: Diagnosis not present

## 2013-01-28 DIAGNOSIS — E1142 Type 2 diabetes mellitus with diabetic polyneuropathy: Secondary | ICD-10-CM | POA: Diagnosis not present

## 2013-01-28 DIAGNOSIS — E1149 Type 2 diabetes mellitus with other diabetic neurological complication: Secondary | ICD-10-CM | POA: Diagnosis not present

## 2013-01-28 DIAGNOSIS — Z79899 Other long term (current) drug therapy: Secondary | ICD-10-CM | POA: Diagnosis not present

## 2013-02-18 DIAGNOSIS — Z91013 Allergy to seafood: Secondary | ICD-10-CM | POA: Diagnosis not present

## 2013-02-18 DIAGNOSIS — J301 Allergic rhinitis due to pollen: Secondary | ICD-10-CM | POA: Diagnosis not present

## 2013-02-18 DIAGNOSIS — J45909 Unspecified asthma, uncomplicated: Secondary | ICD-10-CM | POA: Diagnosis not present

## 2013-02-18 DIAGNOSIS — J3089 Other allergic rhinitis: Secondary | ICD-10-CM | POA: Diagnosis not present

## 2013-02-21 ENCOUNTER — Ambulatory Visit (INDEPENDENT_AMBULATORY_CARE_PROVIDER_SITE_OTHER)
Admission: RE | Admit: 2013-02-21 | Discharge: 2013-02-21 | Disposition: A | Discharge status: Home / Self Care | Payer: Medicare Other | Source: Ambulatory Visit | Attending: Internal Medicine | Admitting: Internal Medicine

## 2013-02-21 DIAGNOSIS — R918 Other nonspecific abnormal finding of lung field: Secondary | ICD-10-CM | POA: Diagnosis not present

## 2013-02-21 DIAGNOSIS — J984 Other disorders of lung: Secondary | ICD-10-CM | POA: Diagnosis not present

## 2013-02-21 DIAGNOSIS — Z8639 Personal history of other endocrine, nutritional and metabolic disease: Secondary | ICD-10-CM | POA: Diagnosis not present

## 2013-02-21 DIAGNOSIS — Z862 Personal history of diseases of the blood and blood-forming organs and certain disorders involving the immune mechanism: Secondary | ICD-10-CM

## 2013-02-21 DIAGNOSIS — Z1231 Encounter for screening mammogram for malignant neoplasm of breast: Secondary | ICD-10-CM | POA: Diagnosis not present

## 2013-02-25 ENCOUNTER — Encounter: Payer: Self-pay | Admitting: Internal Medicine

## 2013-02-25 ENCOUNTER — Encounter (INDEPENDENT_AMBULATORY_CARE_PROVIDER_SITE_OTHER): Payer: Self-pay

## 2013-02-25 ENCOUNTER — Ambulatory Visit (INDEPENDENT_AMBULATORY_CARE_PROVIDER_SITE_OTHER): Payer: Medicare Other | Admitting: Internal Medicine

## 2013-02-25 VITALS — BP 102/68 | HR 62 | Ht 62.0 in | Wt 162.8 lb

## 2013-02-25 DIAGNOSIS — J40 Bronchitis, not specified as acute or chronic: Secondary | ICD-10-CM

## 2013-02-25 DIAGNOSIS — R0789 Other chest pain: Secondary | ICD-10-CM | POA: Diagnosis not present

## 2013-02-25 DIAGNOSIS — R918 Other nonspecific abnormal finding of lung field: Secondary | ICD-10-CM

## 2013-02-25 DIAGNOSIS — D869 Sarcoidosis, unspecified: Secondary | ICD-10-CM | POA: Diagnosis not present

## 2013-02-25 DIAGNOSIS — Z23 Encounter for immunization: Secondary | ICD-10-CM

## 2013-02-25 MED ORDER — PROMETHAZINE-CODEINE 6.25-10 MG/5ML PO SYRP: 5.0000 mL | ORAL_SOLUTION | Freq: Four times a day (QID) | ORAL | Status: DC | PRN

## 2013-02-25 NOTE — Patient Instructions (Addendum)
Ok to continue [place] 2 puffs then rinse mouth, twice daily  Flu vaccine  Script for cough syrup  Please call as needed

## 2013-02-25 NOTE — Progress Notes (Signed)
02/05/11- 66 yoF former smoker seen on referral from Dr Estanislado Pandy for concern of sarcoid. 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 Shortness of breath has been chronic with one flight of stairs. She thinks she may be gradually getting worse. There is chronic dry cough which is been worse since a flulike illness 3 weeks ago. 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 I can't tell a specific arthritis diagnosis has been made. There is no history of liver or kidney disease. Bronchitis was diagnosed at the time of allergy skin testing a year ago. Food sensitivity to shrimp and crab which make her lips itch. Positive skin test also for grass and house dust. She has not been on allergy vaccine. A rescue inhaler has helped at times. 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 Advair was no help and Symbicort may have helped only a little. ACE 46 (8-52) 09/30/10 Medication choices been limited by glaucoma. No history of TB exposure. Past history of sinus infections. Active problem with heartburn which is partly suppressed with Nexium once or twice daily/ Dr Collene Mares. Married, now on short-term disability. Quit smoking 1980 Several family members on her father's side had sarcoid.  03/10/11- 66 yoF former smoker followed for bronchitis, hx sarcoid, complicated by GERD, glaucoma Dulera inhaler did not have significant improvement. She is off of methotrexate and prednisone as of this month. 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 She occasionally wakes coughing with thick white mucus, stress incontinence. Just occasionally tight. DOE with one flight of stairs and has to stop at the top to get her breath but  no longer has to catch her breath halfway down stairs carrying laundry. Tussionex no longer works as well for cough as it used to.  07/08/11- 65 yoF former smoker followed for bronchitis, hx sarcoid, complicated by GERD, glaucoma Iincreased cough and congestion(nasal and chest)Productive-green/yellow in color x 3 days. Denies any fever but has been having chills. SOB and wheezing along with cough. SOB is all the time. Need to check if okay to start back on Methtrexate again. Dr Estanislado Pandy has been following for sarcoid of the eye and wants to resume methotrexate. There has been increased cough, productive of  mucus, worse at night and supine. Maybe some chills. Stress incontinence. Arthralgias. Postnasal drip. GERD is fairly well controlled with Nexium. Taking tramadol now for pain so is not helping the cough. Failed benzonatate. This was a prolonged visit 40 minutes. CXR 02/09/11-images reviewed with her. IMPRESSION:  Stable examination. No active cardiopulmonary process.  Original Report Authenticated By: Vivia Ewing, M.D.    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  Will see Dr.Deveshwar next month for decision about restarting methotrexate. Persistent pain mid anterior chest through to back, worse with prolonged standing and kitchen, eased by leaning over. No reflux or dysphagia. Expects seasonal bronchitis but denies routine cough or dyspnea, fever or sweats. CXR 07/10/11- negative. ACE 07/08/11- 40/ WNL.  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   FOLLOWS FOR: still having problems with SOB with activity. No change in complaints of pain, mid anterior chest, left greater than right mostly around into her back and with prolonged standing she has to stop kitchen work to sit down. We discussed this as more of a back problem and a chest problem. She has seen Dr.Van Creed Copper w/ [facility] . Dr. Estanislado Pandy is treating with ibuprofen.  07/08/12- 85 yoF former smoker followed for bronchitis, hx sarcoid, complicated by GERD, glaucoma FOLLOWS FOR: went for ROV at [facility]-was told allergies were flared up and Deveshwar MD stated Sarcoid was flared up as well. Was given Rx's(currently on Prednisone taper) continues to cough-green and yellow with streaks of blood at times. (Dr Creed Copper) Complains of backache. Rhinitis is responded to nasal spray, Singulair, Allegra. Dr.Deveshwar feels for sarcoid might be flaring. She prescribed prednisone, methotrexate, folic acid. ACE level 07/08/11- 40 (8-52) CT chest 05/19/12 IMPRESSION:  . No explanation for the patient's history of back pain.  2. Despite the reported clinical history of sarcoidosis, there are  no imaging findings to suggest a diagnosis of sarcoidosis at this  time in this patient. Clinical correlation is recommended.  3. Mild centrilobular emphysema.  4. Atherosclerosis, including left anterior descending coronary  artery disease. Please note that although the presence of coronary  artery calcium documents the presence of coronary artery disease,  the severity of this disease and any potential stenosis cannot be  assessed on this non-gated CT examination. Assessment for  potential risk factor modification, dietary therapy or  pharmacologic therapy may be warranted, if clinically indicated.  5. Small pulmonary nodules in the lungs, as above. The largest of  these is a 6 mm ground-glass attenuation nodule in the right upper  lobe (image 16 of series 3). Initial follow-up by  chest CT without  contrast is recommended in 3 months to confirm persistence. This  recommendation follows the consensus statement: Recommendations for  the Management of Subsolid Pulmonary Nodules Detected at CT: A  Statement from the Cook as published in Radiology  2013; 266:304-317.  Original Report Authenticated By: Vinnie Langton, M.D.  08/19/12- 70 yoF former smoker followed for bronchitis, hx sarcoid, complicated by GERD, glaucoma follows for:  discuss ct results  Occasional sweats, minor rash at right ankle, cough productive white, thick and occasionally yellow sputum. Some dyspnea with exertion, no chest pain or palpitation. ACE 07/08/12- 27 (8-52) CT chest 08/18/12 IMPRESSION:  1. New clustered peribronchovascular ill-defined nodularity in the  right upper lobe, indicative of an infectious bronchiolitis.  2. Previously measured 6 mm right upper lobe nodule is less  discrete on the current examination.  3. Coronary artery calcification.  Original Report Authenticated By: Lorin Picket, M.D.  02/25/13- 15 yoF former smoker followed for bronchitis/ bronchiolitis/ nodules, hx  Ocular sarcoid, complicated by GERD, glaucoma FOLLOWS FOR: review PFT, 6MW, CT Scan, and sputum cultures with patient. Pt states that she continues to have SOB with activitiy; when not active-she feels like she cant breathe Occasional cough, scant clear sputum, better with Dulera. Pains around chest, chronic, if she forces a deep breath. PFT 09/21/2012-mild obstructive airways disease with response to bronchodilator, moderate restriction, diffusion severely reduced. FVC 1.88/82%, FEV11.58/88%, FEV1/FVC 0.84, FEF 25-75% 1.86/106% with some response to bronchodilator in small airways. TLC 62%, DLCO 49%. 6MWT 09/21/12- WNL 97%, 97%, 98%, 381 m. Sputum culture-normal  flora, negative for AFB and fungus  ROS-see HPI Constitutional:   No-   weight loss, +night sweats, fevers, chills, fatigue,  lassitude. HEENT:   No-  headaches, difficulty swallowing, tooth/dental problems, sore throat,       Some  sneezing, itching, ear ache, +nasal congestion, post nasal drip,  CV:  +chest pain, no-orthopnea, PND, swelling in lower extremities, anasarca, dizziness, palpitations Resp: +  shortness of breath with exertion not at rest.              No-   productive cough,  + non-productive cough,  No- coughing up of blood.                No-change in color of mucus.  No- wheezing.   Skin: No-   rash or lesions. GI:  No-   heartburn, indigestion, abdominal pain, nausea, vomiting,  GU:  MS:  +  joint pain or swelling, + back pain Neuro-     nothing unusual Psych:  No- change in mood or affect. No depression or anxiety.  No memory loss.  OBJ General- Alert, Oriented, Affect anxious, Distress- none acute Skin- rash-none, lesions- none, excoriation- none Lymphadenopathy- none Head- atraumatic            Eyes- Gross vision intact, PERRLA, conjunctivae clear secretions- not injected            Ears- Hearing aid Left            Nose- sniffing, no-Septal dev, mucus, polyps, erosion, perforation             Throat- Mallampati II , mucosa clear , drainage- none, tonsils- atrophic Neck- flexible , trachea midline, no stridor , thyroid nl, carotid no bruit Chest - symmetrical excursion , unlabored           Heart/CV- RRR , no murmur , no gallop  , no rub, nl s1 s2                           - JVD- none , edema- none, stasis changes- none, varices- none           Lung- clear,no-cough, no wheeze , dullness-none, rub- none           Chest wall-  Abd-  Br/ Gen/ Rectal- Not done, not indicated Extrem- cyanosis- none, clubbing, none, atrophy- none, strength- nl Neuro- grossly intact to observation

## 2013-03-26 ENCOUNTER — Encounter: Payer: Self-pay | Admitting: Internal Medicine

## 2013-03-26 NOTE — Assessment & Plan Note (Signed)
Probably musculoskeletal. Pattern is not changing.

## 2013-03-26 NOTE — Assessment & Plan Note (Signed)
Asthma/bronchitis Plan-Watch for [facility]

## 2013-03-26 NOTE — Assessment & Plan Note (Signed)
Sarcoid would explain low diffusion capacity

## 2013-03-26 NOTE — Assessment & Plan Note (Signed)
Hold sarcoid could explain nodules.

## 2013-03-31 ENCOUNTER — Ambulatory Visit (INDEPENDENT_AMBULATORY_CARE_PROVIDER_SITE_OTHER): Payer: Medicare Other | Admitting: Physician Assistant

## 2013-03-31 ENCOUNTER — Encounter: Payer: Self-pay | Admitting: Physician Assistant

## 2013-03-31 VITALS — BP 140/78 | HR 67 | Ht 62.0 in | Wt 168.0 lb

## 2013-03-31 DIAGNOSIS — E785 Hyperlipidemia, unspecified: Secondary | ICD-10-CM | POA: Diagnosis not present

## 2013-03-31 DIAGNOSIS — R0602 Shortness of breath: Secondary | ICD-10-CM | POA: Diagnosis not present

## 2013-03-31 DIAGNOSIS — I2510 Atherosclerotic heart disease of native coronary artery without angina pectoris: Secondary | ICD-10-CM | POA: Diagnosis not present

## 2013-03-31 DIAGNOSIS — R079 Chest pain, unspecified: Secondary | ICD-10-CM

## 2013-03-31 DIAGNOSIS — J40 Bronchitis, not specified as acute or chronic: Secondary | ICD-10-CM

## 2013-03-31 DIAGNOSIS — R002 Palpitations: Secondary | ICD-10-CM | POA: Diagnosis not present

## 2013-03-31 DIAGNOSIS — I4729 Other ventricular tachycardia: Secondary | ICD-10-CM

## 2013-03-31 DIAGNOSIS — I472 Ventricular tachycardia, unspecified: Secondary | ICD-10-CM

## 2013-03-31 DIAGNOSIS — D869 Sarcoidosis, unspecified: Secondary | ICD-10-CM

## 2013-03-31 LAB — BASIC METABOLIC PANEL
BUN: 14 mg/dL (ref 6–23)
CHLORIDE: 103 meq/L (ref 96–112)
CO2: 24 meq/L (ref 19–32)
Calcium: 9.3 mg/dL (ref 8.4–10.5)
Creatinine, Ser: 0.7 mg/dL (ref 0.4–1.2)
GFR: 102.66 mL/min (ref >60.00)
Glucose, Bld: 102 mg/dL — ABNORMAL HIGH (ref 70–99)
POTASSIUM: 4.3 meq/L (ref 3.5–5.1)
Sodium: 136 mEq/L (ref 135–145)

## 2013-03-31 LAB — CBC WITH DIFFERENTIAL/PLATELET
BASOS PCT: 0.2 % (ref 0.0–3.0)
Basophils Absolute: 0 10*3/uL (ref 0.0–0.1)
EOS ABS: 0.1 10*3/uL (ref 0.0–0.7)
EOS PCT: 1.6 % (ref 0.0–5.0)
HCT: 36 % (ref 36.0–46.0)
HEMOGLOBIN: 11.6 g/dL — AB (ref 12.0–15.0)
Lymphocytes Relative: 24.4 % (ref 12.0–46.0)
Lymphs Abs: 2.2 10*3/uL (ref 0.7–4.0)
MCHC: 32.2 g/dL (ref 30.0–36.0)
MCV: 86.8 fl (ref 78.0–100.0)
Monocytes Absolute: 0.6 10*3/uL (ref 0.1–1.0)
Monocytes Relative: 6.1 % (ref 3.0–12.0)
NEUTROS ABS: 6.2 10*3/uL (ref 1.4–7.7)
Neutrophils Relative %: 67.7 % (ref 43.0–77.0)
Platelets: 210 10*3/uL (ref 150.0–400.0)
RBC: 4.15 Mil/uL (ref 3.87–5.11)
RDW: 15 % — ABNORMAL HIGH (ref 11.5–14.6)
WBC: 9.1 10*3/uL (ref 4.5–10.5)

## 2013-03-31 LAB — BRAIN NATRIURETIC PEPTIDE: PRO B NATRI PEPTIDE: 43 pg/mL (ref 0.0–100.0)

## 2013-03-31 LAB — TSH: TSH: 0.86 u[IU]/mL (ref 0.35–5.50)

## 2013-03-31 NOTE — Progress Notes (Signed)
[place] [place], [place]  [address] Phone: [phone] Fax:  [phone]  Date:  03/31/2013   ID:  Sheila Reynolds, DOB September 19, 1945, MRN [id number]  PCP:  Maximino Greenland, MD  Cardiologist:  Dr. Liam Rogers     History of Present Illness: Sheila Reynolds is a 68 y.o. female with a history of nonobstructive CAD, HL, NSVT, palpitations, sarcoidosis.  Last seen in 08/2012. She had chest pain at that time and stress testing demonstrated no ischemia with normal LV function.  Patient presents to the office today with several complaints. She has noted worsening palpitations. She describes them as flutters. She also feels rapid heart rates.  She denies syncope or near syncope. She does feel lightheaded at times. Her heart racing may last 30 seconds to 1 minute. She has never counted to see how fast it was going. She does admit to increased caffeine intake. She denies alcohol abuse or tobacco abuse. She has not been taking any OTC medications. She notes dyspnea with activity. This is fairly chronic. She feels this is worse over the last few months. She also notes a tightness in her chest with activity. She denies orthopnea. She does note some pedal edema but this is fairly stable. She had chest discomfort this past weekend at rest. She describes it as pins and needles. It only lasted 15-20 seconds. She had no associated symptoms.    Of note, she saw Dr. Annamaria Boots recently. Recent follow up CT demonstrated stable pulmonary nodules. PFTs in 09/2012 demonstrated mild obstructive airway disease with response to bronchodilator, moderate restriction and severely reduced diffusion capacity. Her 6 minute walk test was normal with normal oxygen saturation.  LHC (04/2007):  Proximal LAD 40, proximal D1 30-40, EF 65-70%. Echocardiogram (09/2010): EF 60-65%. Event monitor (02/2012): NSR, PAC/PVCs. Lexiscan Myoview (09/02/12): No ischemia, EF 71%, normal study.   Recent Labs: 09/01/2012: ALT 15;  Creatinine 0.7; HDL Cholesterol 37.70*; LDL (calc) 58; Potassium 4.3   Wt Readings from Last 3 Encounters:  03/31/13 168 lb (76.204 kg)  02/25/13 162 lb 12.8 oz (73.846 kg)  09/01/12 160 lb (72.576 kg)     Past Medical History  Diagnosis Date  . Hyperlipidemia   . Chest pain   . Nonsustained ventricular tachycardia   . Fibromyalgia   . Coronary artery disease     Mild to moderate CAD  . Palpitations     Current Outpatient Prescriptions  Medication Sig Dispense Refill  . Albuterol Sulfate (VENTOLIN HFA IN) Inhale 2 puffs into the lungs 4 (four) times daily as needed.       . ALPRAZolam (XANAX) 0.5 MG tablet Take 0.25 mg by mouth 2 (two) times daily as needed. For sleep      . aspirin 81 MG tablet Take 81 mg by mouth daily.        [name] atorvastatin (LIPITOR) 20 MG tablet TAKE 1 TABLET (20 MG TOTAL) BY MOUTH DAILY.  30 tablet  1  . brinzolamide (AZOPT) 1 % ophthalmic suspension Place 1 drop into both eyes 3 (three) times daily.       . cholecalciferol (VITAMIN D) 1000 UNITS tablet Take 1,000 Units by mouth daily.      [name] esomeprazole (NEXIUM) 40 MG capsule Take 40 mg by mouth daily.        . hydrochlorothiazide (HYDRODIURIL) 25 MG tablet Take 25 mg by mouth 3 (three) times a week.       [name] JANUMET 50-500 MG per tablet  Take 1 tablet by mouth Twice daily.      . metoprolol (LOPRESSOR) 50 MG tablet Take 1 tablet (50 mg total) by mouth 2 (two) times daily.  60 tablet  12  . mometasone-formoterol (DULERA) 100-5 MCG/ACT AERO Inhale 2 puffs into the lungs 2 (two) times daily.        . montelukast (SINGULAIR) 10 MG tablet Take 10 mg by mouth at bedtime.      . NON FORMULARY Prednisone Eye Drop Left Eye Every Two Hrs or PRN      . ONE TOUCH ULTRA TEST test strip 1 each by Other route as needed.       . promethazine-codeine (PHENERGAN WITH CODEINE) 6.25-10 MG/5ML syrup Take 5 mLs by mouth every 6 (six) hours as needed for cough.  200 mL  0  . Travoprost (TRAVATAN OP) Place 1 drop into both eyes at  bedtime.        No current facility-administered medications for this visit.    Allergies:   Crestor and Shellfish allergy   Social History:  The patient  reports that she quit smoking about 33 years ago. Her smoking use included Cigarettes. She has a 4 pack-year smoking history. She does not have any smokeless tobacco history on file. She reports that she does not drink alcohol.   Family History:  The patient's family history includes Cancer in her mother and paternal grandmother; Diabetes in her brother and father; Glaucoma in her brother and father; Heart disease in her father and paternal grandmother.   ROS:  Please see the history of present illness.   She has a chronic cough. She has occasional dysphagia which is fairly stable for her.   All other systems reviewed and negative.   PHYSICAL EXAM: VS:  BP 140/78  Pulse 67  Ht 5\' 2"  (1.575 m)  Wt 168 lb (76.204 kg)  BMI 30.72 kg/m2 Well nourished, well developed, in no acute distress HEENT: normal Neck: no JVD Cardiac:  normal S1, S2; RRR; no murmur Chest: Tender to palpation Lungs:  clear to auscultation bilaterally, no wheezing, rhonchi or rales Abd: soft, nontender, no hepatomegaly Ext: no edema Skin: warm and dry Neuro:  CNs 2-12 intact, no focal abnormalities noted  EKG:  NSR, HR 67, normal axis, no ST changes     ASSESSMENT AND PLAN:  1. Palpitations: She has had palpitations in the past. She has had documented PACs and PVCs. She has not worn a monitor in over one year. She does admit to increased caffeine intake. I have asked her to reduce this.  Check a basic metabolic panel, TSH. Arrange a 21 day event monitor to rule out significant tachyarrhythmia.  She may take an extra metoprolol 25 mg as needed for significant palpitations. 2. Dyspnea: This is a fairly chronic symptom. I suspect it is mainly related to her chronic bronchitis and sarcoid. She has had significantly reduced diffusion capacity on prior PFTs. She had  recent stress testing which was low risk. I will obtain a follow up echocardiogram to reassess her LV function as well as a right-sided pressures. I will also check a CBC, BNP and TSH.  We could consider proceeding with cardiopulmonary stress testing in the future. 3. Chest Pain:  She has typical and atypical features. Overall, I suspect her symptoms are likely related to her chronic bronchitis and sarcoid. She had a recent stress test that was low risk. She had a heart catheterization in 2009 with no significant CAD. I do  not think she requires repeat stress testing at this time. Proceed with event monitor and echocardiogram as well as lab work as noted. She is a diabetic. We could consider proceeding with right and left heart catheterization if her symptoms continue. 4. Non-Sustained Ventricular Tachycardia:  She has had a hx of this.  Obtain event monitor as noted.  Continue beta blocker.  5. CAD: Continue aspirin, statin, beta blocker. 6. Hyperlipidemia: Continue statin. 7. Chronic Bronchitis/Sarcoidosis: Continue follow up with pulmonary. 8. Disposition: Follow up with Dr. Acie Fredrickson in one month.  Signed, Richardson Dopp, PA-C  03/31/2013 2:31 PM

## 2013-03-31 NOTE — Patient Instructions (Signed)
OK TO TAKE EXTRA 25 MG OF METOPROLOL AS NEEDED FOR INCREASED PALPITATIONS  LAB WORK TODAY; BMET, CBC W/DIFF, TSH, BNP  Your physician has requested that you have an echocardiogram DX 786.50, 786.05. Echocardiography is a painless test that uses sound waves to create images of your heart. It provides your doctor with information about the size and shape of your heart and how well your heart's chambers and valves are working. This procedure takes approximately one hour. There are no restrictions for this procedure.  Your physician has recommended that you wear an event monitor DX 785.1. Event monitors are medical devices that record the heart's electrical activity. Doctors most often [place] these monitors to diagnose arrhythmias. Arrhythmias are problems with the speed or rhythm of the heartbeat. The monitor is a small, portable device. You can wear one while you do your normal daily activities. This is usually used to diagnose what is causing palpitations/syncope (passing out).  Your physician recommends that you schedule a follow-up appointment in: 3-4 WEEKS WITH DR.[name] NAHSHER

## 2013-04-01 ENCOUNTER — Telehealth: Payer: Self-pay | Admitting: *Deleted

## 2013-04-01 NOTE — Telephone Encounter (Signed)
pt notified about lab results with verbal understanding, advised to f/u w/PCP about mildly low Hgb. I will fax results to PCP.

## 2013-04-03 ENCOUNTER — Other Ambulatory Visit: Payer: Self-pay | Admitting: Cardiovascular Disease

## 2013-04-04 ENCOUNTER — Ambulatory Visit (HOSPITAL_COMMUNITY): Payer: Medicare Other | Attending: Cardiology | Admitting: Radiology

## 2013-04-04 ENCOUNTER — Encounter: Payer: Self-pay | Admitting: Cardiology

## 2013-04-04 DIAGNOSIS — R002 Palpitations: Secondary | ICD-10-CM | POA: Diagnosis not present

## 2013-04-04 DIAGNOSIS — R079 Chest pain, unspecified: Secondary | ICD-10-CM | POA: Diagnosis not present

## 2013-04-04 DIAGNOSIS — R0602 Shortness of breath: Secondary | ICD-10-CM

## 2013-04-04 NOTE — Progress Notes (Signed)
Echocardiogram Performed. 

## 2013-04-05 ENCOUNTER — Encounter: Payer: Self-pay | Admitting: Physician Assistant

## 2013-04-12 DIAGNOSIS — E1136 Type 2 diabetes mellitus with diabetic cataract: Secondary | ICD-10-CM | POA: Diagnosis not present

## 2013-04-12 DIAGNOSIS — H4011X0 Primary open-angle glaucoma, stage unspecified: Secondary | ICD-10-CM | POA: Diagnosis not present

## 2013-04-12 DIAGNOSIS — E1139 Type 2 diabetes mellitus with other diabetic ophthalmic complication: Secondary | ICD-10-CM | POA: Diagnosis not present

## 2013-04-15 DIAGNOSIS — H4011X0 Primary open-angle glaucoma, stage unspecified: Secondary | ICD-10-CM | POA: Diagnosis not present

## 2013-04-15 DIAGNOSIS — E1139 Type 2 diabetes mellitus with other diabetic ophthalmic complication: Secondary | ICD-10-CM | POA: Diagnosis not present

## 2013-04-20 ENCOUNTER — Encounter (INDEPENDENT_AMBULATORY_CARE_PROVIDER_SITE_OTHER): Payer: Medicare Other

## 2013-04-20 ENCOUNTER — Encounter: Payer: Self-pay | Admitting: *Deleted

## 2013-04-20 DIAGNOSIS — D869 Sarcoidosis, unspecified: Secondary | ICD-10-CM | POA: Diagnosis not present

## 2013-04-20 DIAGNOSIS — R002 Palpitations: Secondary | ICD-10-CM

## 2013-04-20 DIAGNOSIS — H409 Unspecified glaucoma: Secondary | ICD-10-CM | POA: Diagnosis not present

## 2013-04-20 DIAGNOSIS — E1142 Type 2 diabetes mellitus with diabetic polyneuropathy: Secondary | ICD-10-CM | POA: Diagnosis not present

## 2013-04-20 DIAGNOSIS — E1149 Type 2 diabetes mellitus with other diabetic neurological complication: Secondary | ICD-10-CM | POA: Diagnosis not present

## 2013-04-20 NOTE — Progress Notes (Signed)
Patient ID: Sheila Reynolds, female   DOB: [birth year], 68 y.o.   MRN: [id number] E Cardio [place] 30 day monitor applied

## 2013-05-02 ENCOUNTER — Encounter: Payer: Self-pay | Admitting: Cardiovascular Disease

## 2013-05-02 ENCOUNTER — Ambulatory Visit (INDEPENDENT_AMBULATORY_CARE_PROVIDER_SITE_OTHER): Payer: Medicare Other | Admitting: Cardiovascular Disease

## 2013-05-02 VITALS — BP 132/80 | HR 88 | Ht 62.0 in | Wt 170.0 lb

## 2013-05-02 DIAGNOSIS — R002 Palpitations: Secondary | ICD-10-CM

## 2013-05-02 DIAGNOSIS — I2510 Atherosclerotic heart disease of native coronary artery without angina pectoris: Secondary | ICD-10-CM | POA: Diagnosis not present

## 2013-05-02 NOTE — Assessment & Plan Note (Addendum)
She has a hx of NSVT.  She has had palpitations but so far nothing has shown up on the monitor.    Will continue to observe.  She has normal LV function so her NS VT is likely just monitored.

## 2013-05-02 NOTE — Patient Instructions (Signed)
Your physician wants you to follow-up in: 6 MONTHS.  You will receive a reminder letter in the mail two months in advance. If you don't receive a letter, please call our office to schedule the follow-up appointment.  Your physician recommends that you continue on your current medications as directed. Please refer to the Current Medication list given to you today.  

## 2013-05-02 NOTE — Progress Notes (Signed)
Sheila Reynolds Date of Birth  1945/11/04 [place] [address]    [place]   [place] [place]  [address]    [place], [place]  [address] [phone]  Fax  [phone]  [phone]  Fax [phone]  Problem list: 1. Hyperlipidemia 2. Chest pain 3. Nonsustained ventricular tachycardia 4. Mild to moderate coronary artery disease by cath in 2009. 5. 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 Keturah Barre  History of Present Illness:  Sheila Reynolds is a 68 y.o. female with the above noted hx.  She has continued to have some palpitations.  She has occasional episodes of lightheadedness when she has palpitations. Her main issue has been lots of total body cramps. She has been seen in the emergency room. Her lab work looked fine.  She's had lots of dyspnea especially with exertion. She has a history of sarcoidosis and is followed by Dr. Annamaria Boots.  March 11, 2012: Sheila Reynolds has had more palpitations recently.  It Appears that her metoprolol dose was decreased slightly.  We placed an event monitor on her. She did not have any episodes of atrial fibrillation. She has occasional drinker atrial contractions and occasional premature ventricular contractions. There were no life-threatening arrhythmias.  She is doing well from a cardiac standpoint.   She's not had any episodes of syncope.  She has significant shortness of breath especially when climbing stairs. This is likely due to her sarcoidosis. She denies any chest pain. Her BP is a bit elevated.  She avoids salt.    May 02, 2013 :  She presented to see Richardson Dopp in February with some increased palpitations and lightheadedness.  He recommended that she decrease her caffeine intake. He placed a 21 day event monitor and instructed her to take an extra metoprolol as needed.  The palpitations can occur at any time. They occur with rest and with exertion. They're not constant. The last 30-45 seconds and  occur multiple times through the day.  Current Outpatient Prescriptions on File Prior to Visit  Medication Sig Dispense Refill  . Albuterol Sulfate (VENTOLIN HFA IN) Inhale 2 puffs into the lungs 4 (four) times daily as needed.       . ALPRAZolam (XANAX) 0.5 MG tablet Take 0.25 mg by mouth 2 (two) times daily as needed. For sleep      . aspirin 81 MG tablet Take 81 mg by mouth daily.        [name] atorvastatin (LIPITOR) 20 MG tablet TAKE 1 TABLET (20 MG TOTAL) BY MOUTH DAILY.  30 tablet  1  . brinzolamide (AZOPT) 1 % ophthalmic suspension Place 1 drop into both eyes 3 (three) times daily.       . cholecalciferol (VITAMIN D) 1000 UNITS tablet Take 1,000 Units by mouth daily.      [name] esomeprazole (NEXIUM) 40 MG capsule Take 40 mg by mouth daily.        . hydrochlorothiazide (HYDRODIURIL) 25 MG tablet Take 25 mg by mouth 3 (three) times a week.       [name] JANUMET 50-500 MG per tablet Take 1 tablet by mouth Twice daily.      . metoprolol (LOPRESSOR) 50 MG tablet TAKE 1 TABLET BY MOUTH 2 TIMES DAILY.  60 tablet  1  . mometasone-formoterol (DULERA) 100-5 MCG/ACT AERO Inhale 2 puffs into the lungs 2 (two) times daily.        . montelukast (SINGULAIR) 10 MG tablet Take  10 mg by mouth at bedtime.      . NON FORMULARY Prednisone Eye Drop Left Eye Every Two Hrs or PRN      . ONE TOUCH ULTRA TEST test strip 1 each by Other route as needed.       . promethazine-codeine (PHENERGAN WITH CODEINE) 6.25-10 MG/5ML syrup Take 5 mLs by mouth every 6 (six) hours as needed for cough.  200 mL  0  . Travoprost (TRAVATAN OP) Place 1 drop into both eyes at bedtime.        No current facility-administered medications on file prior to visit.    Allergies  Allergen Reactions  . Crestor [Rosuvastatin Calcium] Other (See Comments)    muscle aches  . Shellfish Allergy     Crab, shrimp and lobster ---lips itch and tingle    Past Medical History  Diagnosis Date  . Hyperlipidemia   . Chest pain   . Nonsustained ventricular  tachycardia   . Fibromyalgia   . Coronary artery disease     Mild to moderate CAD  . Palpitations   . Hx of echocardiogram     Echo (03/2013):  Tech limited; Mild focal basal septal hypertrophy, EF 60-65%, normal RVF    Past Surgical History  Procedure Laterality Date  . Cardiac catheterization  05/04/2007    reveals overall normal left ventricular systolic function. Ejection fraction 65-70%  . Breast surgery  approx 1992    bilateral breast surgery for fibrocystic breast disease  . Total abdominal hysterectomy  1982    History  Smoking status  . Former Smoker -- 1.00 packs/day for 4 years  . Types: Cigarettes  . Quit date: 02/11/1980  Smokeless tobacco  . Not on file    History  Alcohol Use No    Family History  Problem Relation Age of Onset  . Heart disease Father   . Heart disease Paternal Grandmother   . Cancer Mother     unknown type, ?lung  . Cancer Paternal Grandmother     unknown type  . Diabetes Father   . Diabetes Brother   . Glaucoma Father   . Glaucoma Brother     Reviw of Systems:  Reviewed in the HPI.  All other systems are negative.  Physical Exam: Blood pressure 132/80, pulse 88, height 5\' 2"  (1.575 m), weight 170 lb (77.111 kg). General: Well developed, well nourished, in no acute distress.  Head: Normocephalic, atraumatic, sclera non-icteric, mucus membranes are moist,   Neck: Supple. Negative for carotid bruits. JVD not elevated.  Lungs: Clear bilaterally to auscultation without wheezes, rales, or rhonchi. Breathing is unlabored.  Heart: RRR with S1 S2. No murmurs, rubs, or gallops appreciated.  Abdomen: Soft, non-tender, non-distended with normoactive bowel sounds. No hepatomegaly. No rebound/guarding. No obvious abdominal masses.  Msk:  Strength and tone appear normal for age.  Extremities: No clubbing or cyanosis. No edema.  Distal pedal pulses are 2+ and equal bilaterally.  Neuro: Alert and oriented X 3. Moves all extremities  spontaneously.  Psych:  Responds to questions appropriately with a normal affect.  ECG: March 11, 2012: Normal sinus rhythm at 70 beats a minute. She has occasional sinus arrhythmia. EKG is otherwise normal.  Assessment / Plan:

## 2013-05-03 DIAGNOSIS — H43819 Vitreous degeneration, unspecified eye: Secondary | ICD-10-CM | POA: Diagnosis not present

## 2013-05-03 DIAGNOSIS — H4040X0 Glaucoma secondary to eye inflammation, unspecified eye, stage unspecified: Secondary | ICD-10-CM | POA: Diagnosis not present

## 2013-05-03 DIAGNOSIS — H25049 Posterior subcapsular polar age-related cataract, unspecified eye: Secondary | ICD-10-CM | POA: Diagnosis not present

## 2013-05-10 DIAGNOSIS — J45909 Unspecified asthma, uncomplicated: Secondary | ICD-10-CM | POA: Diagnosis not present

## 2013-05-10 DIAGNOSIS — Z91013 Allergy to seafood: Secondary | ICD-10-CM | POA: Diagnosis not present

## 2013-05-10 DIAGNOSIS — J019 Acute sinusitis, unspecified: Secondary | ICD-10-CM | POA: Diagnosis not present

## 2013-05-10 DIAGNOSIS — J301 Allergic rhinitis due to pollen: Secondary | ICD-10-CM | POA: Diagnosis not present

## 2013-05-18 DIAGNOSIS — H4040X0 Glaucoma secondary to eye inflammation, unspecified eye, stage unspecified: Secondary | ICD-10-CM | POA: Diagnosis not present

## 2013-05-30 ENCOUNTER — Telehealth: Payer: Self-pay | Admitting: Nurse Practitioner

## 2013-05-30 NOTE — Telephone Encounter (Signed)
Called patient to report eCardio monitor results:  Per Dr. Acie Fredrickson, NSR with occasional PVC, no episodes of nonsustained VT.  Patient verbalized understanding and agreement to follow up as directed.

## 2013-06-04 ENCOUNTER — Other Ambulatory Visit: Payer: Self-pay | Admitting: Cardiovascular Disease

## 2013-06-16 DIAGNOSIS — H4040X0 Glaucoma secondary to eye inflammation, unspecified eye, stage unspecified: Secondary | ICD-10-CM | POA: Diagnosis not present

## 2013-06-20 DIAGNOSIS — J301 Allergic rhinitis due to pollen: Secondary | ICD-10-CM | POA: Diagnosis not present

## 2013-06-20 DIAGNOSIS — Z79899 Other long term (current) drug therapy: Secondary | ICD-10-CM | POA: Diagnosis not present

## 2013-06-20 DIAGNOSIS — L299 Pruritus, unspecified: Secondary | ICD-10-CM | POA: Diagnosis not present

## 2013-06-21 DIAGNOSIS — H2510 Age-related nuclear cataract, unspecified eye: Secondary | ICD-10-CM | POA: Diagnosis not present

## 2013-07-13 ENCOUNTER — Other Ambulatory Visit: Payer: Self-pay | Admitting: Ophthalmology

## 2013-07-13 ENCOUNTER — Other Ambulatory Visit (HOSPITAL_COMMUNITY): Payer: Self-pay | Admitting: *Deleted

## 2013-07-13 NOTE — Pre-Procedure Instructions (Signed)
Sheila Reynolds  07/13/2013   Your procedure is scheduled on:  Wednesday, July 20, 2013 at 12:30 PM.   Report to [facility] Short Stay (use Main Entrance "A') at 10:30 AM.   Call this number if you have problems the morning of surgery: [phone]   Remember:   Do not eat food or drink liquids after midnight Tuesday, 07/19/13   Take these medicines the morning of surgery with A SIP OF WATER: esomeprazole ([place]),  metoprolol (LOPRESSOR), pregabalin (LYRICA), mometasone-formoterol (DULERA), brimonidine (ALPHAGAN P) and brinzolamide (AZOPT) eye drops.  If needed: Albuterol Sulfate (VENTOLIN HFA IN) inhaler. ( Please bring inhaler with you the morning of surgery).  Do NOT take your diabetic medications the morning of surgery.   Stop Aspirin, vitamins, herbal medications and NSAIDs  ie: Ibuprofen, Advil, Naproxen or any medication containing Aspirin as of today.    Do not wear jewelry, make-up or nail polish.  Do not wear lotions, powders, or perfumes. You may wear deodorant.  Do not shave 48 hours prior to surgery.   Do not bring valuables to the hospital.  [facility] is not responsible for any belongings or valuables.               Contacts, dentures or bridgework may not be worn into surgery.  Leave suitcase in the car. After surgery it may be brought to your room.  For patients admitted to the hospital, discharge time is determined by your treatment team.               Patients discharged the day of surgery will not be allowed to drive home.  Name and phone number of your driver: Family/friend   Special Instructions: [facility] - Preparing for Surgery  Before surgery, you can play an important role.  Because skin is not sterile, your skin needs to be as free of germs as possible.  You can reduce the number of germs on you skin by washing with CHG (chlorahexidine gluconate) soap before surgery.  CHG is an antiseptic cleaner which kills germs and bonds with the skin to continue  killing germs even after washing.  Please DO NOT use if you have an allergy to CHG or antibacterial soaps.  If your skin becomes reddened/irritated stop using the CHG and inform your nurse when you arrive at Short Stay.  Do not shave (including legs and underarms) for at least 48 hours prior to the first CHG shower.  You may shave your face.  Please follow these instructions carefully:   1.  Shower with CHG Soap the night before surgery and the morning of Surgery.  2.  If you choose to wash your hair, wash your hair first as usual with your normal shampoo.  3.  After you shampoo, rinse your hair and body thoroughly to remove the Shampoo.  4.  Use CHG as you would any other liquid soap.  You can apply chg directly to the skin and wash gently with scrungie or a clean washcloth.  5.  Apply the CHG Soap to your body ONLY FROM THE NECK DOWN.  Do not use on open wounds or open sores.  Avoid contact with your eyes, ears, mouth and genitals (private parts).  Wash genitals (private parts) with your normal soap.  6.  Wash thoroughly, paying special attention to the area where your surgery  will be performed.  7.  Thoroughly rinse your body with warm water from the neck down.  8.  DO NOT  shower/wash with your normal soap after using and rinsing off the CHG Soap.  9.  Pat yourself dry with a clean towel.            10.  Wear clean pajamas.            11.  Place clean sheets on your bed the night of your first shower and do not sleep with pets.  Day of Surgery  Do not apply any lotions the morning of surgery.  Please wear clean clothes to the hospital/surgery center.     Please read over the following fact sheets that you were given: Pain Booklet, Coughing and Deep Breathing and Surgical Site Infection Prevention

## 2013-07-14 ENCOUNTER — Encounter (HOSPITAL_COMMUNITY)
Admission: RE | Admit: 2013-07-14 | Discharge: 2013-07-14 | Disposition: A | Discharge status: Home / Self Care | Payer: Medicare Other | Source: Ambulatory Visit | Attending: Ophthalmology | Admitting: Ophthalmology

## 2013-07-14 ENCOUNTER — Other Ambulatory Visit: Payer: Self-pay | Admitting: Ophthalmology

## 2013-07-14 ENCOUNTER — Ambulatory Visit (HOSPITAL_COMMUNITY)
Admission: RE | Admit: 2013-07-14 | Discharge: 2013-07-14 | Disposition: A | Discharge status: Home / Self Care | Payer: Medicare Other | Source: Ambulatory Visit | Attending: Anesthesiology | Admitting: Anesthesiology

## 2013-07-14 ENCOUNTER — Encounter (HOSPITAL_COMMUNITY): Payer: Self-pay

## 2013-07-14 DIAGNOSIS — I2510 Atherosclerotic heart disease of native coronary artery without angina pectoris: Secondary | ICD-10-CM | POA: Insufficient documentation

## 2013-07-14 DIAGNOSIS — Z01812 Encounter for preprocedural laboratory examination: Secondary | ICD-10-CM | POA: Diagnosis not present

## 2013-07-14 DIAGNOSIS — Z01818 Encounter for other preprocedural examination: Secondary | ICD-10-CM | POA: Insufficient documentation

## 2013-07-14 HISTORY — DX: Cardiac murmur, unspecified: R01.1

## 2013-07-14 HISTORY — DX: Anemia, unspecified: D64.9

## 2013-07-14 HISTORY — DX: Family history of other specified conditions: Z84.89

## 2013-07-14 HISTORY — DX: Reserved for inherently not codable concepts without codable children: IMO0001

## 2013-07-14 HISTORY — DX: Anxiety disorder, unspecified: F41.9

## 2013-07-14 HISTORY — DX: Essential (primary) hypertension: I10

## 2013-07-14 HISTORY — DX: Gastro-esophageal reflux disease without esophagitis: K21.9

## 2013-07-14 LAB — CBC
HCT: 36 % (ref 36.0–46.0)
HEMOGLOBIN: 12.4 g/dL (ref 12.0–15.0)
MCH: 29 pg (ref 26.0–34.0)
MCHC: 34.4 g/dL (ref 30.0–36.0)
MCV: 84.3 fL (ref 78.0–100.0)
Platelets: 189 10*3/uL (ref 150–400)
RBC: 4.27 MIL/uL (ref 3.87–5.11)
RDW: 14.7 % (ref 11.5–15.5)
WBC: 7.8 10*3/uL (ref 4.0–10.5)

## 2013-07-14 LAB — BASIC METABOLIC PANEL
BUN: 13 mg/dL (ref 6–23)
CO2: 25 mEq/L (ref 19–32)
Calcium: 9.5 mg/dL (ref 8.4–10.5)
Chloride: 102 mEq/L (ref 96–112)
Creatinine, Ser: 0.69 mg/dL (ref 0.50–1.10)
GFR, EST NON AFRICAN AMERICAN: 89 mL/min — AB (ref >90)
Glucose, Bld: 122 mg/dL — ABNORMAL HIGH (ref 70–99)
POTASSIUM: 4.3 meq/L (ref 3.7–5.3)
SODIUM: 141 meq/L (ref 137–147)

## 2013-07-14 MED ORDER — MITOMYCIN-C INJECTION USE IN OR ONLY (0.4 MG/ML): 0.5000 mL | Freq: Once | INTRAVENOUS | Status: DC

## 2013-07-14 MED ORDER — TETRACAINE HCL 0.5 % OP SOLN: 1.0000 [drp] | OPHTHALMIC | Status: DC

## 2013-07-14 NOTE — H&P (Signed)
                  History & Physical:   DATE:   06-21-2013  NAME:  Sheila Reynolds, Sheila Reynolds     [id number]       HISTORY OF PRESENT ILLNESS: Referred by Dr Brewington    Chief Eye Complaints blurry vision OD glaucoma  & iritis with frequent flare ups, no longer seeing flashes of white light.   NIDDM x2011, BS 127mg% this am. HA1C 6.7 back in April.   HPI: EYES: Reports symptoms of vision disturbances , pain OS>OD    LOCATION:  OS>OD   QUALITY/COURSE:   Reports condition is worsening.        INTENSITY/SEVERITY:    Mild   Reports measurement ( or degree) as        DURATION:   Reports the general length of symptoms to be years.      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   ICD#250.50  Onset: 05/03/2013 10:03   Sarcoidosis   ICD#135  Onset: 05/03/2013 10:04   Posterior subcapsular polar senile cataract   ICD#366.14  Onset: 05/03/2013 10:53  OD  SURGERIES: Pick List - Surgeries  MEDICATIONS: Janumet: Strength-  SIG-    Lyrica (Pregabalin):   50 mg capsule  SIG-  1 cap(s)   2 times a day   Metoprolol  (Lopressor):   50 mg tablet  SIG-  1 each   2 times a day   Hydrochlorothiazide (HCTZ):   12.5 mg capsule  SIG-  1 each   once a day   Lipitor (Atorvastatin):   40 mg tablet  SIG-  1 tab(s)   once a day (at bedtime)   Singulair (Montelukast):   4 mg granule  SIG-  1 tab(s)   once a day (in the evening)   Aspirin:  81 mg tablet  SIG-  1 each   once a day   Dulera: Strength-  SIG-    Ventolin: Strength-  SIG-  Dose-  Freq-    Azopt: Strength-  SIG-  1 gtt in each eye 3 times a day for 30 days   Travatan Z: Strength-  SIG-  1 gtt OU QHS   Alphagan  P: Strength-  SIG-  1 gtt in each affected eye 3 times a day for 30 days  Lotemax  REVIEW OF SYSTEMS: ROS:   GEN- Constitutional: HENT: GEN - Endocrine: Reports symptoms of diabetes.    LUNGS/Respiratory:  HEART/Cardiovascular: Reports symptoms of palpitations.    ABD/Gastrointestinal:  Musculoskeletal (BJE): +++++      arthralgias    muscle pain  spasm  NEURO/Neurological: PSYCH/Psychiatric:    Is the pt oriented to time, place,yes person? yes  Mood depressed __ normal  agitated __   TOBACCO: Smoker Status:     Tobacco use:     Tobacco cessation:          Smoker Status:   Former Smoker. 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  Father is dead.  ALLERGIES: Crestor Severity-  Onset-  Reaction-  Status- Active Type- Drug allergy Date Changed-    Oyster Shell Calcium 500:    Starter - Allergies - Summary:  PHYSICAL EXAMINATION: VS: BMI: 31.2.  BP: 177/90.  H: 62.00 in.  P: 65 /min.  W: 170lbs 0oz.    Va    OD: cc 20/40 OS: cc 20/30+1  EYEGLASSES:  OD: +1.00 +1.75 x174                                               OS: +1.50 +1.00 x001 ADD: +2.00  Auto Refraction05/01/2014 10:27  OD: -0.25 +0.75  x001 OS: -1.00 +1.25 x109  K'S05/01/2014 10:28  OD: 44.50 45.50  OS: 44.75  45.50   MR 06/21/2013 10:27   OD: +1.00 +1.75 x174 NI OS: +1.00 +1.00 x001 20/30 ADD   VF:  OD                                               OS Motility orthophoria and full PUPILS: 3 mm round reactive negative Marcus Gunn  EYELIDS & OCULAR  ADNEXA ptosis OD guarding ptosis OS  SLE: Conjunctiva quiet OD, plus one injection, OS Cornea arcus each eye anterior chamber  deep & quiet OD,      trace cell & flare  OS Iris Brown each eye no rubeosis either eye Lens Plus one nuclear sclerosis each eye,  +2 posterior subcapsular opacity OD  Vitreous posterior vitreous detachment each eye  Ta   in mmHg    OD 26     OS 24,25 Time 06/21/2013 11:25   Dilation tropicamide Fundus: optic nerve  OD   inferior rim loss                                          OS 70% cup optic nerve intact   Macula      OD  irregular light reflex                                                  OS normal light reflex  Vessels perivascular sheathing  Periphery normal OU    Visual field test provided by Dr. Brewington revealsProgressive visual field loss right eye right eye reveals a dense superior nasal step and superior arcuate defect with encroachment on fixation inferior nasal step and inferior arcuate defect. Left eye reveals slight progression from 2012 2013, revealing a few paracentral scotomas.  Ocular coherent tomography had been performed, it appears the macula.  Numbers 135 right eye 107 left eye Questionable macular edema OD, interpretation, states   decreased vision likely on the basis of cataract  Exam: GENERAL: Appearance: HEAD, EARS, NOSE AND THROAT: Ears-Nose (external) Inspection: Externally, nose and ears are normal in appearance and without scars, lesions, or nodules.      Hearing assessment shows no problems with normal conversation.      LUNGS and RESPIRATORY: Lung auscultation elicits no wheezing, rhonci, rales or rubs and with equal breath sounds.    Respiratory effort described as breathing is unlabored and chest movement is symmetrical.    HEART (Cardiovascular): Heart auscultation discovers regular rate and rhythm; no murmur, gallop or rub. Normal heart sounds.    ABDOMEN (Gastrointestinal): Mass/Tenderness Exam: Neither are  present.     MUSCULOSKELETAL (BJE): Inspection-Palpation: No major bone, joint, tendon, or muscle changes.      NEUROLOGICAL: Alert and oriented. No major deficits of coordination or sensation.      PSYCHIATRIC: Insight and judgment appear  both to be intact and appropriate.    Mood and affect are described as normal mood and full affect.    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   __________________________ Sheila Reynolds, Jr. Starter - Inactive Problems:  

## 2013-07-19 MED ORDER — TROPICAMIDE 1 % OP SOLN
1.0000 [drp] | OPHTHALMIC | Status: AC
  Administered 2013-07-20 (×3): 1 [drp] via OPHTHALMIC
  Filled 2013-07-19: qty 3

## 2013-07-19 MED ORDER — CYCLOPENTOLATE HCL 1 % OP SOLN
1.0000 [drp] | OPHTHALMIC | Status: AC
  Administered 2013-07-20 (×3): 1 [drp] via OPHTHALMIC
  Filled 2013-07-19: qty 2

## 2013-07-19 MED ORDER — GATIFLOXACIN 0.5 % OP SOLN
1.0000 [drp] | OPHTHALMIC | Status: AC | PRN
  Administered 2013-07-20 (×3): 1 [drp] via OPHTHALMIC
  Filled 2013-07-19: qty 2.5

## 2013-07-19 MED ORDER — PHENYLEPHRINE HCL 2.5 % OP SOLN
1.0000 [drp] | OPHTHALMIC | Status: AC
  Administered 2013-07-20 (×3): 1 [drp] via OPHTHALMIC
  Filled 2013-07-19: qty 2

## 2013-07-19 MED ORDER — KETOROLAC TROMETHAMINE 0.5 % OP SOLN
1.0000 [drp] | OPHTHALMIC | Status: AC
  Administered 2013-07-20 (×3): 1 [drp] via OPHTHALMIC
  Filled 2013-07-19: qty 5

## 2013-07-20 ENCOUNTER — Encounter (HOSPITAL_COMMUNITY)
Admission: RE | Disposition: A | Discharge status: Home / Self Care | Payer: Self-pay | Source: Ambulatory Visit | Attending: Ophthalmology

## 2013-07-20 ENCOUNTER — Ambulatory Visit (HOSPITAL_COMMUNITY): Payer: Medicare Other | Admitting: Certified Registered Nurse Anesthetist

## 2013-07-20 ENCOUNTER — Ambulatory Visit (HOSPITAL_COMMUNITY)
Admission: RE | Admit: 2013-07-20 | Discharge: 2013-07-20 | Disposition: A | Discharge status: Home / Self Care | Payer: Medicare Other | Source: Ambulatory Visit | Attending: Ophthalmology | Admitting: Ophthalmology

## 2013-07-20 ENCOUNTER — Encounter (HOSPITAL_COMMUNITY): Payer: Medicare Other | Admitting: Certified Registered Nurse Anesthetist

## 2013-07-20 ENCOUNTER — Encounter (HOSPITAL_COMMUNITY): Payer: Self-pay | Admitting: Certified Registered Nurse Anesthetist

## 2013-07-20 DIAGNOSIS — E1139 Type 2 diabetes mellitus with other diabetic ophthalmic complication: Secondary | ICD-10-CM | POA: Diagnosis not present

## 2013-07-20 DIAGNOSIS — H409 Unspecified glaucoma: Secondary | ICD-10-CM | POA: Diagnosis not present

## 2013-07-20 DIAGNOSIS — IMO0001 Reserved for inherently not codable concepts without codable children: Secondary | ICD-10-CM | POA: Diagnosis not present

## 2013-07-20 DIAGNOSIS — Z7982 Long term (current) use of aspirin: Secondary | ICD-10-CM | POA: Diagnosis not present

## 2013-07-20 DIAGNOSIS — H02409 Unspecified ptosis of unspecified eyelid: Secondary | ICD-10-CM | POA: Diagnosis not present

## 2013-07-20 DIAGNOSIS — H269 Unspecified cataract: Secondary | ICD-10-CM | POA: Diagnosis not present

## 2013-07-20 DIAGNOSIS — H25049 Posterior subcapsular polar age-related cataract, unspecified eye: Secondary | ICD-10-CM | POA: Diagnosis not present

## 2013-07-20 DIAGNOSIS — Z79899 Other long term (current) drug therapy: Secondary | ICD-10-CM | POA: Diagnosis not present

## 2013-07-20 DIAGNOSIS — D869 Sarcoidosis, unspecified: Secondary | ICD-10-CM | POA: Diagnosis not present

## 2013-07-20 DIAGNOSIS — Z87891 Personal history of nicotine dependence: Secondary | ICD-10-CM | POA: Insufficient documentation

## 2013-07-20 DIAGNOSIS — E119 Type 2 diabetes mellitus without complications: Secondary | ICD-10-CM | POA: Diagnosis not present

## 2013-07-20 DIAGNOSIS — H2510 Age-related nuclear cataract, unspecified eye: Secondary | ICD-10-CM | POA: Diagnosis not present

## 2013-07-20 DIAGNOSIS — H4040X0 Glaucoma secondary to eye inflammation, unspecified eye, stage unspecified: Secondary | ICD-10-CM | POA: Diagnosis not present

## 2013-07-20 HISTORY — PX: MINI SHUNT INSERTION: SHX5337

## 2013-07-20 HISTORY — PX: CATARACT EXTRACTION W/PHACO: SHX586

## 2013-07-20 HISTORY — PX: MITOMYCIN C APPLICATION: SHX6375

## 2013-07-20 LAB — GLUCOSE, CAPILLARY
GLUCOSE-CAPILLARY: 117 mg/dL — AB (ref 70–99)
GLUCOSE-CAPILLARY: 101 mg/dL — AB (ref 70–99)

## 2013-07-20 SURGERY — PHACOEMULSIFICATION, CATARACT, WITH IOL INSERTION
Anesthesia: Monitor Anesthesia Care | Site: Eye | Laterality: Right

## 2013-07-20 MED ORDER — TOBRAMYCIN 0.3 % OP OINT
TOPICAL_OINTMENT | OPHTHALMIC | Status: DC | PRN
  Administered 2013-07-20: 1 via OPHTHALMIC

## 2013-07-20 MED ORDER — BSS IO SOLN
INTRAOCULAR | Status: AC
  Filled 2013-07-20: qty 500

## 2013-07-20 MED ORDER — MITOMYCIN 0.2 MG OP KIT
0.2000 mg | PACK | OPHTHALMIC | Status: DC
  Filled 2013-07-20: qty 1

## 2013-07-20 MED ORDER — NA CHONDROIT SULF-NA HYALURON 40-30 MG/ML IO SOLN
INTRAOCULAR | Status: AC
  Filled 2013-07-20: qty 0.5

## 2013-07-20 MED ORDER — TRIAMCINOLONE ACETONIDE 40 MG/ML IJ SUSP
INTRAMUSCULAR | Status: AC
  Filled 2013-07-20: qty 5

## 2013-07-20 MED ORDER — SODIUM CHLORIDE 0.9 % IV SOLN
INTRAVENOUS | Status: DC | PRN
  Administered 2013-07-20 (×2): via INTRAVENOUS

## 2013-07-20 MED ORDER — SODIUM CHLORIDE 0.9 % IV SOLN
INTRAVENOUS | Status: DC
  Administered 2013-07-20: 12:00:00 via INTRAVENOUS

## 2013-07-20 MED ORDER — FENTANYL CITRATE 0.05 MG/ML IJ SOLN
INTRAMUSCULAR | Status: DC | PRN
  Administered 2013-07-20 (×6): 25 ug via INTRAVENOUS

## 2013-07-20 MED ORDER — FLUORESCEIN SODIUM 1 MG OP STRP
ORAL_STRIP | OPHTHALMIC | Status: DC | PRN
  Administered 2013-07-20: 1 via OPHTHALMIC

## 2013-07-20 MED ORDER — EPINEPHRINE HCL 1 MG/ML IJ SOLN
INTRAOCULAR | Status: DC | PRN
  Administered 2013-07-20: 13:00:00

## 2013-07-20 MED ORDER — HYALURONIDASE HUMAN 150 UNIT/ML IJ SOLN
INTRAMUSCULAR | Status: AC
  Filled 2013-07-20: qty 1

## 2013-07-20 MED ORDER — PILOCARPINE HCL 4 % OP SOLN
OPHTHALMIC | Status: AC
  Filled 2013-07-20: qty 15

## 2013-07-20 MED ORDER — LIDOCAINE-EPINEPHRINE 2 %-1:100000 IJ SOLN
INTRAMUSCULAR | Status: AC
  Filled 2013-07-20: qty 1

## 2013-07-20 MED ORDER — ONDANSETRON HCL 4 MG/2ML IJ SOLN
INTRAMUSCULAR | Status: AC
  Filled 2013-07-20: qty 2

## 2013-07-20 MED ORDER — ACETYLCHOLINE CHLORIDE 1:100 IO SOLR
INTRAOCULAR | Status: DC | PRN
  Administered 2013-07-20: 10 mg via INTRAOCULAR

## 2013-07-20 MED ORDER — DEXAMETHASONE SODIUM PHOSPHATE 10 MG/ML IJ SOLN
INTRAMUSCULAR | Status: AC
  Filled 2013-07-20: qty 1

## 2013-07-20 MED ORDER — ACETYLCHOLINE CHLORIDE 1:100 IO SOLR
INTRAOCULAR | Status: AC
  Filled 2013-07-20: qty 1

## 2013-07-20 MED ORDER — GENTAMICIN SULFATE 40 MG/ML IJ SOLN
INTRAMUSCULAR | Status: AC
  Filled 2013-07-20: qty 2

## 2013-07-20 MED ORDER — TRIAMCINOLONE ACETONIDE 40 MG/ML IJ SUSP
INTRAMUSCULAR | Status: DC | PRN
  Administered 2013-07-20: 4 mg

## 2013-07-20 MED ORDER — MIDAZOLAM HCL 2 MG/2ML IJ SOLN
INTRAMUSCULAR | Status: AC
  Filled 2013-07-20: qty 2

## 2013-07-20 MED ORDER — ONDANSETRON HCL 4 MG/2ML IJ SOLN
INTRAMUSCULAR | Status: DC | PRN
  Administered 2013-07-20: 4 mg via INTRAVENOUS

## 2013-07-20 MED ORDER — MIDAZOLAM HCL 5 MG/5ML IJ SOLN
INTRAMUSCULAR | Status: DC | PRN
  Administered 2013-07-20 (×2): 1 mg via INTRAVENOUS

## 2013-07-20 MED ORDER — NA CHONDROIT SULF-NA HYALURON 40-30 MG/ML IO SOLN
INTRAOCULAR | Status: DC | PRN
  Administered 2013-07-20: 0.5 mL via INTRAOCULAR

## 2013-07-20 MED ORDER — ACETAMINOPHEN 325 MG PO TABS
650.0000 mg | ORAL_TABLET | ORAL | Status: DC | PRN
  Filled 2013-07-20: qty 2

## 2013-07-20 MED ORDER — FLUORESCEIN SODIUM 1 MG OP STRP
ORAL_STRIP | OPHTHALMIC | Status: AC
  Filled 2013-07-20: qty 2

## 2013-07-20 MED ORDER — TETRACAINE HCL 0.5 % OP SOLN
OPHTHALMIC | Status: AC
  Filled 2013-07-20: qty 2

## 2013-07-20 MED ORDER — BSS IO SOLN
INTRAOCULAR | Status: AC
  Filled 2013-07-20: qty 15

## 2013-07-20 MED ORDER — GLYCOPYRROLATE 0.2 MG/ML IJ SOLN
INTRAMUSCULAR | Status: DC | PRN
  Administered 2013-07-20: 0.1 mg via INTRAVENOUS

## 2013-07-20 MED ORDER — FENTANYL CITRATE 0.05 MG/ML IJ SOLN
INTRAMUSCULAR | Status: AC
  Filled 2013-07-20: qty 5

## 2013-07-20 MED ORDER — HYALURONIDASE HUMAN 150 UNIT/ML IJ SOLN
INTRAMUSCULAR | Status: DC | PRN
  Administered 2013-07-20: 13:00:00 via RETROBULBAR

## 2013-07-20 MED ORDER — PROPOFOL 10 MG/ML IV BOLUS
INTRAVENOUS | Status: DC | PRN
Start: 2013-07-20 — End: 2013-07-20
  Administered 2013-07-20 (×9): 20 mg via INTRAVENOUS
  Administered 2013-07-20: 10 mg via INTRAVENOUS
  Administered 2013-07-20: 5 mg via INTRAVENOUS
  Administered 2013-07-20: 10 mg via INTRAVENOUS
  Administered 2013-07-20: 20 mg via INTRAVENOUS
  Administered 2013-07-20: 30 mg via INTRAVENOUS
  Administered 2013-07-20: 25 mg via INTRAVENOUS

## 2013-07-20 MED ORDER — BSS IO SOLN
INTRAOCULAR | Status: DC | PRN
  Administered 2013-07-20: 500 mL

## 2013-07-20 MED ORDER — SODIUM HYALURONATE 10 MG/ML IO SOLN
INTRAOCULAR | Status: DC | PRN
  Administered 2013-07-20: 0.85 mL via INTRAOCULAR

## 2013-07-20 MED ORDER — LIDOCAINE HCL 2 % IJ SOLN
INTRAMUSCULAR | Status: AC
  Filled 2013-07-20: qty 20

## 2013-07-20 MED ORDER — EPINEPHRINE HCL 1 MG/ML IJ SOLN
INTRAMUSCULAR | Status: AC
  Filled 2013-07-20: qty 1

## 2013-07-20 MED ORDER — PROPOFOL 10 MG/ML IV BOLUS
INTRAVENOUS | Status: AC
  Filled 2013-07-20: qty 20

## 2013-07-20 MED ORDER — TOBRAMYCIN-DEXAMETHASONE 0.3-0.1 % OP OINT
TOPICAL_OINTMENT | OPHTHALMIC | Status: AC
  Filled 2013-07-20: qty 3.5

## 2013-07-20 MED ORDER — SODIUM HYALURONATE 10 MG/ML IO SOLN
INTRAOCULAR | Status: AC
  Filled 2013-07-20: qty 0.85

## 2013-07-20 MED ORDER — TETRACAINE HCL 0.5 % OP SOLN
OPHTHALMIC | Status: DC | PRN
  Administered 2013-07-20: 1 [drp] via OPHTHALMIC

## 2013-07-20 MED ORDER — MITOMYCIN 0.2 MG OP KIT
PACK | OPHTHALMIC | Status: DC | PRN
  Administered 2013-07-20: .4 mg via OPHTHALMIC

## 2013-07-20 MED ORDER — BUPIVACAINE HCL (PF) 0.75 % IJ SOLN
INTRAMUSCULAR | Status: AC
  Filled 2013-07-20: qty 10

## 2013-07-20 SURGICAL SUPPLY — 75 items
APL SRG 3 HI ABS STRL LF PLS (MISCELLANEOUS) ×1
APPLICATOR COTTON TIP 6IN STRL (MISCELLANEOUS) ×2 IMPLANT
APPLICATOR DR MATTHEWS STRL (MISCELLANEOUS) ×2 IMPLANT
BLADE EYE CATARACT 19 1.4 BEAV (BLADE) IMPLANT
BLADE KERATOME 2.75 (BLADE) ×2 IMPLANT
BLADE MINI RND TIP GREEN BEAV (BLADE) IMPLANT
BLADE STAB KNIFE 45DEG (BLADE) ×1 IMPLANT
CANISTER SUCTION 2500CC (MISCELLANEOUS) ×1 IMPLANT
CANNULA ANTERIOR CHAMBER 27GA (MISCELLANEOUS) ×2 IMPLANT
CORDS BIPOLAR (ELECTRODE) ×2 IMPLANT
COVER MAYO STAND STRL (DRAPES) ×2 IMPLANT
DRAPE OPHTHALMIC 40X48 W POUCH (DRAPES) ×2 IMPLANT
DRAPE RETRACTOR (MISCELLANEOUS) ×2 IMPLANT
ERASER HMR WETFIELD 23G BP (MISCELLANEOUS) ×1 IMPLANT
FILTER BLUE MILLIPORE (MISCELLANEOUS) IMPLANT
GLOVE BIO SURGEON STRL SZ8 (GLOVE) ×2 IMPLANT
GLOVE BIO SURGEON STRL SZ8.5 (GLOVE) ×1 IMPLANT
GLOVE BIOGEL PI IND STRL 7.0 (GLOVE) IMPLANT
GLOVE BIOGEL PI IND STRL 8 (GLOVE) IMPLANT
GLOVE BIOGEL PI INDICATOR 7.0 (GLOVE) ×1
GLOVE BIOGEL PI INDICATOR 8 (GLOVE) ×1
GLOVE ECLIPSE 7.0 STRL STRAW (GLOVE) ×1 IMPLANT
GLOVE SURG SS PI 6.5 STRL IVOR (GLOVE) ×1 IMPLANT
GLOVE SURG SS PI 7.0 STRL IVOR (GLOVE) ×1 IMPLANT
GOWN STRL REIN XL XLG (GOWN DISPOSABLE) ×3 IMPLANT
GOWN STRL REUS W/ TWL LRG LVL3 (GOWN DISPOSABLE) ×2 IMPLANT
GOWN STRL REUS W/ TWL XL LVL3 (GOWN DISPOSABLE) ×1 IMPLANT
GOWN STRL REUS W/TWL LRG LVL3 (GOWN DISPOSABLE) ×2
GOWN STRL REUS W/TWL XL LVL3 (GOWN DISPOSABLE)
KIT BASIN OR (CUSTOM PROCEDURE TRAY) ×2 IMPLANT
KIT ROOM TURNOVER OR (KITS) ×2 IMPLANT
KNIFE CRESCENT 2.5 55 ANG (BLADE) IMPLANT
KNIFE GRIESHABER SHARP 2.5MM (MISCELLANEOUS) ×2 IMPLANT
LENS IOL ACRSF IQ PC 19.0 (Intraocular Lens) IMPLANT
LENS IOL ACRYSOF IQ POST 19.0 (Intraocular Lens) ×2 IMPLANT
MARKER SKIN DUAL TIP RULER LAB (MISCELLANEOUS) ×1 IMPLANT
MASK EYE SHIELD (GAUZE/BANDAGES/DRESSINGS) ×1 IMPLANT
NDL 18GX1X1/2 (RX/OR ONLY) (NEEDLE) ×1 IMPLANT
NDL 25GX 5/8IN NON SAFETY (NEEDLE) ×1 IMPLANT
NDL FILTER BLUNT 18X1 1/2 (NEEDLE) ×1 IMPLANT
NDL HYPO 23GX1 LL BLUE HUB (NEEDLE) IMPLANT
NDL HYPO 30X.5 LL (NEEDLE) ×1 IMPLANT
NEEDLE 18GX1X1/2 (RX/OR ONLY) (NEEDLE) ×2 IMPLANT
NEEDLE 22X1 1/2 (OR ONLY) (NEEDLE) ×1 IMPLANT
NEEDLE 25GX 5/8IN NON SAFETY (NEEDLE) ×2 IMPLANT
NEEDLE FILTER BLUNT 18X 1/2SAF (NEEDLE) ×1
NEEDLE FILTER BLUNT 18X1 1/2 (NEEDLE) ×1 IMPLANT
NEEDLE HYPO 23GX1 LL BLUE HUB (NEEDLE) IMPLANT
NEEDLE HYPO 30X.5 LL (NEEDLE) ×2 IMPLANT
NS IRRIG 1000ML POUR BTL (IV SOLUTION) ×2 IMPLANT
PACK CATARACT CUSTOM (CUSTOM PROCEDURE TRAY) ×2 IMPLANT
PAD ARMBOARD 7.5X6 YLW CONV (MISCELLANEOUS) ×4 IMPLANT
PAK PIK CVS CATARACT (OPHTHALMIC) ×2 IMPLANT
PROBE ANTERIOR VITRECTOR (OPHTHALMIC) IMPLANT
SHUNT EXPRS GLAUCOMA MINI P200 (Intraocular Lens) ×1 IMPLANT
SPEAR EYE SURG WECK-CEL (MISCELLANEOUS) IMPLANT
SPECIMEN JAR SMALL (MISCELLANEOUS) IMPLANT
SUT ETHILON 10 0 CS140 6 (SUTURE) ×1 IMPLANT
SUT ETHILON 9 0 BV100 4 (SUTURE) IMPLANT
SUT ETHILON 9 0 TG140 8 (SUTURE) IMPLANT
SUT MERSILENE 6 0 S14 DA (SUTURE) IMPLANT
SUT SILK 4 0 C 3 735G (SUTURE) IMPLANT
SUT SILK 6 0 G 6 (SUTURE) ×2 IMPLANT
SUT VICRYL 8 0 TG140 8 (SUTURE) ×1 IMPLANT
SUT VICRYL 9-0 (SUTURE) ×1 IMPLANT
SYR 20CC LL (SYRINGE) ×4 IMPLANT
SYR 3ML LL SCALE MARK (SYRINGE) IMPLANT
SYR 50ML SLIP (SYRINGE) ×1 IMPLANT
SYR TB 1ML LUER SLIP (SYRINGE) ×2 IMPLANT
TAPE PAPER MEDFIX 1IN X 10YD (GAUZE/BANDAGES/DRESSINGS) ×1 IMPLANT
TIP ABS 45DEG FLARED 0.9MM (TIP) ×2 IMPLANT
TOWEL OR 17X26 10 PK STRL BLUE (TOWEL DISPOSABLE) ×1 IMPLANT
TUBE CONNECTING 12X1/4 (SUCTIONS) ×1 IMPLANT
WATER STERILE IRR 1000ML POUR (IV SOLUTION) ×2 IMPLANT
WIPE INSTRUMENT VISIWIPE 73X73 (MISCELLANEOUS) ×3 IMPLANT

## 2013-07-20 NOTE — Discharge Instructions (Signed)
The eye patch should remain on the eye at all times. Avoid sleeping on the right side sleep on back or left side with the head slightly elevated. Avoid bending straining or lifting objects over 25 pounds. Do not use any drops in the operative eye continue the same medications in the left eye.

## 2013-07-20 NOTE — Op Note (Signed)
Preoperative diagnosis visually significant cataract and glaucoma associated with ocular inflammation uncontrolled right eye Postoperative diagnosis: Same Procedure insertion of glaucoma express device with mitomycin-C and phacoemulsification with intraocular lens implant right eye Complications: None Anesthesia: 2% Xylocaine in a 50-50 mixture of 0.75% Marcaine with ample [place] Assistant: Mindy Procedure: The patient has to the operating room where she was given a peribulbar block with the aforementioned local anesthetic agent. Following this the patient's face was prepped and draped in the usual sterile fashion with a surgeon initially sitting temporally and a microscope in position and Weck-Cel sponges used to fixate the globe and a 15 blade was used to enter through superior clear cornea Viscoat was injected into the anterior chamber. Following this with an additional Weck-Cel sponge to fixate the globe a 2.75 mm keratome blade was used in a stepwise fashion through temporal clear cornea additional Viscoat was injected. A bent 25-gauge needle was used to incise anterior capsule and a continuous tear curvilinear capsulorrhexis was formed. Following this BSS was used to hydrodissect and hydrodelineate the nucleus and the nucleus was noted to rotate and the capsular bag the phacoemulsification unit was then used to sculpt the epinucleus and sculpt a central trough the snapper hook was then used to snapped the nucleus into 4 quadrants all nuclear fragments were removed and the posterior capsule remained intact. The irrigation aspiration device was then used to remove the epinucleus and strip cortical fibrous and the posterior capsule after all cortical fibers had been removed the anterior chamber and capsular bag were filled with Provisc. The intraocular lens implant was examined and noted to have no defects the lens was an Alcon AcrySof IQ SN 60 WF power of 19 diopters SN #[id number] the lens is placed in  the lens injector and injected it was positioned with a Kuglen hook following this part of the viscoelastic was removed from the eye a single 10-0 nylon suture was placed at the incision to achieve watertight closure at this point the operating microscope was positioned at the 12:00 position with a surgeon sitting at 12:00 a 6-0 nylon suture was passed through clear cornea to infraducted the eye additional Provisc was injected in the anterior chamber to pressurized the eye following this a Hoskins forceps was used to grasp conjunctiva in the superior nasal quadrant an incision was made with a Wescott scissors the blunt Wescott's were used to form a fornix-based conjunctival flap Tooke blade was used to recess T9 fibers and bleeding was controlled with cautery a 45 blade was used to fashion a half thickness scleral flap using the Colibri forceps and the 5700 blade dissection was  To the limbus following this mitomycin-C 0.4 mg per cc were placed onto Gelfoam sponges the sponges were placed under the conjunctiva and allowed to stay on the eye for 3 minutes the sponges were then removed and irrigated with 40 cc of balanced salt solution. Following this the scleral flap was elevated using a 26-gauge needle attached to Provisc the needle was passed at the base of the limbus into the anterior chamber the express device was then examined and noted to have no defects the express glaucoma filtration device was a version P2 100 SN #[id number] it was passed through the tract created with a 26-gauge needle and positioned into the anterior chamber the scleral flap was then sutured with 3 interrupted 10-0 nylon sutures the conjunctiva was then closed with a 9-0 Vicryl on a BV 100 needle to achieve watertight closure BSS was  injected in the anterior chamber to remove part of Provisc pressurized the eye the bleb elevated and the incision was Seidel negative a subconjunctival injection of Kenalog 4 mg is given in the inferior  temporal subconjunctival space. All instruments were then removed from the eye including the 6-0 suture topical TobraDex ointment was applied to the eye a patch and Fox U. were placed and the patient returned to recovery area in stable condition Marylynn Pearson Junior M.D.

## 2013-07-20 NOTE — H&P (View-Only) (Signed)
History & Physical:   DATE:   06-21-2013  NAME:  Sheila Reynolds, TIMONEY     [id number]       HISTORY OF PRESENT ILLNESS: Referred by Dr Ricki Miller    Chief Eye Complaints blurry vision OD glaucoma  & iritis with frequent flare ups, no longer seeing flashes of white light.   NIDDM x2011, BS 127mg % this am. HA1C 6.7 back in April.   HPI: EYES: Reports symptoms of vision disturbances , pain OS>OD    LOCATION:  OS>OD   QUALITY/COURSE:   Reports condition is worsening.        INTENSITY/SEVERITY:    Mild   Reports measurement ( or degree) as        DURATION:   Reports the general length of symptoms to be years.      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   ICD#250.50  Onset: 05/03/2013 10:03   Sarcoidosis   ICD#135  Onset: 05/03/2013 10:04   Posterior subcapsular polar senile cataract   ICD#366.14  Onset: 05/03/2013 10:53  OD  SURGERIES: Pick List - Surgeries  MEDICATIONS: Janumet: Strength-  SIG-    Lyrica (Pregabalin):   50 mg capsule  SIG-  1 cap(s)   2 times a day   Metoprolol  (Lopressor):   50 mg tablet  SIG-  1 each   2 times a day   Hydrochlorothiazide (HCTZ):   12.5 mg capsule  SIG-  1 each   once a day   Lipitor (Atorvastatin):   40 mg tablet  SIG-  1 tab(s)   once a day (at bedtime)   Singulair (Montelukast):   4 mg granule  SIG-  1 tab(s)   once a day (in the evening)   Aspirin:  81 mg tablet  SIG-  1 each   once a day   Dulera: Strength-  SIG-    Ventolin: Strength-  SIG-  Dose-  Freq-    Azopt: Strength-  SIG-  1 gtt in each eye 3 times a day for 30 days   Travatan Z: Strength-  SIG-  1 gtt OU QHS   Alphagan  P: Strength-  SIG-  1 gtt in each affected eye 3 times a day for 30 days  Lotemax  REVIEW OF SYSTEMS: ROS:   GEN- Constitutional: HENT: GEN - Endocrine: Reports symptoms of diabetes.    LUNGS/Respiratory:  HEART/Cardiovascular: Reports symptoms of palpitations.    ABD/Gastrointestinal:  Musculoskeletal (BJE): +++++      arthralgias    muscle pain  spasm  NEURO/Neurological: PSYCH/Psychiatric:    Is the pt oriented to time, place,yes person? yes  Mood depressed __ normal  agitated __   TOBACCO: Smoker Status:     Tobacco use:     Tobacco cessation:          Smoker Status:   Former Research scientist (life sciences). ASN#K53.97 Onset: 05/03/2013 09:34 Initial Date:     Smoker Status:  SOCIAL HISTORY: retired  Occupational psychologist List - Social History  FAMILY HISTORY: Positive family history for  -   Glaucoma:;   Diabetes - Type 2:   Negative family history for  -   PARENTS: Glaucoma:;   Cancer:  father went blind  CHILDREN: GRANDPARENTS: SIBLINGS: UNCLES/AUNTS: OTHERS/DISTANT:    Family History - 1st Degree Relatives:   Family History - 1st Degree Relatives:  Mother dead.  Father is dead.  ALLERGIES: Crestor Severity-  Onset-  Reaction-  Status- Active Type- Drug allergy Date Changed-    Oyster Shell Calcium 500:    Starter - Allergies - Summary:  PHYSICAL EXAMINATION: VS: BMI: 31.2.  BP: 177/90.  H: 62.00 in.  P: 65 /min.  W: 170lbs 0oz.    Va    OD: cc 20/40 OS: cc 20/30+1  EYEGLASSES:  OD: +1.00 +1.75 x174                                               OS: +1.50 +1.00 x001 ADD: +2.00  Auto Refraction05/01/2014 10:27  OD: -0.25 +0.75  x001 OS: -1.00 +1.25 x109  K'S05/01/2014 10:28  OD: 44.50 45.50  OS: 44.75  45.50   MR 06/21/2013 10:27   OD: +1.00 +1.75 x174 NI OS: +1.00 +1.00 x001 20/30 ADD   VF:  OD                                               OS Motility orthophoria and full PUPILS: 3 mm round reactive negative Marcus Gunn  EYELIDS & OCULAR  ADNEXA ptosis OD guarding ptosis OS  SLE: Conjunctiva quiet OD, plus one injection, OS Cornea arcus each eye anterior chamber  deep & quiet OD,      trace cell & flare  OS Iris Brown each eye no rubeosis either eye Lens Plus one nuclear sclerosis each eye,  +2 posterior subcapsular opacity OD  Vitreous posterior vitreous detachment each eye  Ta   in mmHg    OD 26     OS 24,25 Time 06/21/2013 11:25   Dilation tropicamide Fundus: optic nerve  OD   inferior rim loss                                          OS 70% cup optic nerve intact   Macula      OD  irregular light reflex                                                  OS normal light reflex  Vessels perivascular sheathing  Periphery normal OU    Visual field test provided by Dr. Ricki Miller revealsProgressive visual field loss right eye right eye reveals a dense superior nasal step and superior arcuate defect with encroachment on fixation inferior nasal step and inferior arcuate defect. Left eye reveals slight progression from 2012 2013, revealing a few paracentral scotomas.  Ocular coherent tomography had been performed, it appears the macula.  Numbers 135 right eye 107 left eye Questionable macular edema OD, interpretation, states  decreased vision likely on the basis of cataract  Exam: GENERAL: Appearance: HEAD, EARS, NOSE AND THROAT: Ears-Nose (external) Inspection: Externally, nose and ears are normal in appearance and without scars, lesions, or nodules.      Hearing assessment shows no problems with normal conversation.      LUNGS and RESPIRATORY: Lung auscultation elicits no wheezing, rhonci, rales or rubs and with equal breath sounds.    Respiratory effort described as breathing is unlabored and chest movement is symmetrical.    HEART (Cardiovascular): Heart auscultation discovers regular rate and rhythm; no murmur, gallop or rub. Normal heart sounds.    ABDOMEN (Gastrointestinal): Mass/Tenderness Exam: Neither are  present.     MUSCULOSKELETAL (BJE): Inspection-Palpation: No major bone, joint, tendon, or muscle changes.      NEUROLOGICAL: Alert and oriented. No major deficits of coordination or sensation.      PSYCHIATRIC: Insight and judgment appear  both to be intact and appropriate.    Mood and affect are described as normal mood and full affect.    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   __________________________ Marylynn Pearson, Lauris Chroman - Inactive Problems:

## 2013-07-20 NOTE — Anesthesia Postprocedure Evaluation (Signed)
  Anesthesia Post-op Note  Patient: Sheila Reynolds  Procedure(s) Performed: Procedure(s): CATARACT EXTRACTION PHACO AND INTRAOCULAR LENS PLACEMENT (IOC) (Right) INSERTION OF GLAUCOMA FILTRATION DEVICE RIGHT EYE (Right) MITOMYCIN C APPLICATION (Right)  Patient Location: PACU  Anesthesia Type:MAC  Level of Consciousness: awake, alert  and oriented  Airway and Oxygen Therapy: Patient Spontanous Breathing and Patient connected to nasal cannula oxygen  Post-op Pain: mild  Post-op Assessment: Post-op Vital signs reviewed, Patient's Cardiovascular Status Stable, Respiratory Function Stable, Patent Airway and NAUSEA AND VOMITING PRESENT  Post-op Vital Signs: stable  Last Vitals:  Filed Vitals:   07/20/13 1600  BP: 117/65  Pulse: 60  Temp: 36.8 C  Resp: 15    Complications: No apparent anesthesia complications

## 2013-07-20 NOTE — Interval H&P Note (Signed)
History and Physical Interval Note:  07/20/2013 12:20 PM  Sheila Reynolds  has presented today for surgery, with the diagnosis of [place]  The various methods of treatment have been discussed with the patient and family. After consideration of risks, benefits and other options for treatment, the patient has consented to  Procedure(s): CATARACT EXTRACTION PHACO AND INTRAOCULAR LENS PLACEMENT (IOC) WITH EXPRESS TUBE (Right) INSERTION OF MINI SHUNT RIGHT EYE (Right) as a surgical intervention .  The patient's history has been reviewed, patient examined, no change in status, stable for surgery.  I have reviewed the patient's chart and labs.  Questions were answered to the patient's satisfaction.     [name]

## 2013-07-20 NOTE — Anesthesia Preprocedure Evaluation (Signed)
Anesthesia Evaluation  Patient identified by MRN, date of birth, ID band Patient awake    Airway       Dental   Pulmonary shortness of breath, COPDformer smoker,  breath sounds clear to auscultation        Cardiovascular hypertension, + CAD + dysrhythmias + Valvular Problems/Murmurs Rate:Normal     Neuro/Psych  Neuromuscular disease    GI/Hepatic GERD-  ,  Endo/Other  diabetes  Renal/GU      Musculoskeletal  (+) Fibromyalgia -  Abdominal   Peds  Hematology   Anesthesia Other Findings   Reproductive/Obstetrics                           Anesthesia Physical Anesthesia Plan  ASA: III  Anesthesia Plan:    Post-op Pain Management:    Induction:   Airway Management Planned: Natural Airway  Additional Equipment:   Intra-op Plan:   Post-operative Plan:   Informed Consent: I have reviewed the patients History and Physical, chart, labs and discussed the procedure including the risks, benefits and alternatives for the proposed anesthesia with the patient or authorized representative who has indicated his/her understanding and acceptance.     Plan Discussed with:   Anesthesia Plan Comments:         Anesthesia Quick Evaluation

## 2013-07-20 NOTE — Transfer of Care (Signed)
Immediate Anesthesia Transfer of Care Note  Patient: Sheila Reynolds  Procedure(s) Performed: Procedure(s): CATARACT EXTRACTION PHACO AND INTRAOCULAR LENS PLACEMENT (IOC) (Right) INSERTION OF GLAUCOMA FILTRATION DEVICE RIGHT EYE (Right) MITOMYCIN C APPLICATION (Right)  Patient Location: PACU  Anesthesia Type:MAC  Level of Consciousness: awake, alert  and oriented  Airway & Oxygen Therapy: Patient Spontanous Breathing  Post-op Assessment: Report given to PACU RN, Post -op Vital signs reviewed and stable and Patient moving all extremities X 4  Post vital signs: Reviewed and stable  Complications: No apparent anesthesia complications

## 2013-07-21 ENCOUNTER — Encounter (HOSPITAL_COMMUNITY): Payer: Self-pay | Admitting: Ophthalmology

## 2013-08-22 DIAGNOSIS — L508 Other urticaria: Secondary | ICD-10-CM | POA: Diagnosis not present

## 2013-08-25 ENCOUNTER — Other Ambulatory Visit: Payer: Medicare Other

## 2013-08-25 ENCOUNTER — Encounter (INDEPENDENT_AMBULATORY_CARE_PROVIDER_SITE_OTHER): Payer: Self-pay

## 2013-08-25 ENCOUNTER — Ambulatory Visit (INDEPENDENT_AMBULATORY_CARE_PROVIDER_SITE_OTHER): Payer: Medicare Other | Admitting: Internal Medicine

## 2013-08-25 ENCOUNTER — Encounter: Payer: Self-pay | Admitting: Internal Medicine

## 2013-08-25 VITALS — BP 122/68 | HR 78 | Ht 62.0 in | Wt 164.2 lb

## 2013-08-25 DIAGNOSIS — I2510 Atherosclerotic heart disease of native coronary artery without angina pectoris: Secondary | ICD-10-CM

## 2013-08-25 DIAGNOSIS — K219 Gastro-esophageal reflux disease without esophagitis: Secondary | ICD-10-CM

## 2013-08-25 DIAGNOSIS — D869 Sarcoidosis, unspecified: Secondary | ICD-10-CM | POA: Diagnosis not present

## 2013-08-25 DIAGNOSIS — G4733 Obstructive sleep apnea (adult) (pediatric): Secondary | ICD-10-CM

## 2013-08-25 DIAGNOSIS — J410 Simple chronic bronchitis: Secondary | ICD-10-CM

## 2013-08-25 LAB — ANGIOTENSIN CONVERTING ENZYME: Angiotensin-Converting Enzyme: 20 U/L (ref 8–52)

## 2013-08-25 MED ORDER — MOMETASONE FURO-FORMOTEROL FUM 200-5 MCG/ACT IN AERO: 2.0000 | INHALATION_SPRAY | Freq: Two times a day (BID) | RESPIRATORY_TRACT | Status: DC

## 2013-08-25 NOTE — Progress Notes (Signed)
02/05/11- 66 yoF former smoker seen on referral from Dr Estanislado Pandy for concern of sarcoid. 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 Shortness of breath has been chronic with one flight of stairs. She thinks she may be gradually getting worse. There is chronic dry cough which is been worse since a flulike illness 3 weeks ago. 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 I can't tell a specific arthritis diagnosis has been made. There is no history of liver or kidney disease. Bronchitis was diagnosed at the time of allergy skin testing a year ago. Food sensitivity to shrimp and crab which make her lips itch. Positive skin test also for grass and house dust. She has not been on allergy vaccine. A rescue inhaler has helped at times. 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 Advair was no help and Symbicort may have helped only a little. ACE 46 (8-52) 09/30/10 Medication choices been limited by glaucoma. No history of TB exposure. Past history of sinus infections. Active problem with heartburn which is partly suppressed with Nexium once or twice daily/ Dr Collene Mares. Married, now on short-term disability. Quit smoking 1980 Several family members on her father's side had sarcoid.  03/10/11- 66 yoF former smoker followed for bronchitis, hx sarcoid, complicated by GERD, glaucoma Dulera inhaler did not have significant improvement. She is off of methotrexate and prednisone as of this month. 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 She occasionally wakes coughing with thick white mucus, stress incontinence. Just occasionally tight. DOE with one flight of stairs and has to stop at the top to get her breath but  no longer has to catch her breath halfway down stairs carrying laundry. Tussionex no longer works as well for cough as it used to.  07/08/11- 65 yoF former smoker followed for bronchitis, hx sarcoid, complicated by GERD, glaucoma Iincreased cough and congestion(nasal and chest)Productive-green/yellow in color x 3 days. Denies any fever but has been having chills. SOB and wheezing along with cough. SOB is all the time. Need to check if okay to start back on Methtrexate again. Dr Estanislado Pandy has been following for sarcoid of the eye and wants to resume methotrexate. There has been increased cough, productive of  mucus, worse at night and supine. Maybe some chills. Stress incontinence. Arthralgias. Postnasal drip. GERD is fairly well controlled with Nexium. Taking tramadol now for pain so is not helping the cough. Failed benzonatate. This was a prolonged visit 40 minutes. CXR 02/09/11-images reviewed with her. IMPRESSION:  Stable examination. No active cardiopulmonary process.  Original Report Authenticated By: Vivia Ewing, M.D.    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  Will see Dr.Deveshwar next month for decision about restarting methotrexate. Persistent pain mid anterior chest through to back, worse with prolonged standing and kitchen, eased by leaning over. No reflux or dysphagia. Expects seasonal bronchitis but denies routine cough or dyspnea, fever or sweats. CXR 07/10/11- negative. ACE 07/08/11- 40/ WNL.  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   FOLLOWS FOR: still having problems with SOB with activity. No change in complaints of pain, mid anterior chest, left greater than right mostly around into her back and with prolonged standing she has to stop kitchen work to sit down. We discussed this as more of a back problem and a chest problem. She has seen Dr.Van Creed Copper w/ [facility] . Dr. Estanislado Pandy is treating with ibuprofen.  07/08/12- 85 yoF former smoker followed for bronchitis, hx sarcoid, complicated by GERD, glaucoma FOLLOWS FOR: went for ROV at [facility]-was told allergies were flared up and Deveshwar MD stated Sarcoid was flared up as well. Was given Rx's(currently on Prednisone taper) continues to cough-green and yellow with streaks of blood at times. (Dr Creed Copper) Complains of backache. Rhinitis is responded to nasal spray, Singulair, Allegra. Dr.Deveshwar feels for sarcoid might be flaring. She prescribed prednisone, methotrexate, folic acid. ACE level 07/08/11- 40 (8-52) CT chest 05/19/12 IMPRESSION:  . No explanation for the patient's history of back pain.  2. Despite the reported clinical history of sarcoidosis, there are  no imaging findings to suggest a diagnosis of sarcoidosis at this  time in this patient. Clinical correlation is recommended.  3. Mild centrilobular emphysema.  4. Atherosclerosis, including left anterior descending coronary  artery disease. Please note that although the presence of coronary  artery calcium documents the presence of coronary artery disease,  the severity of this disease and any potential stenosis cannot be  assessed on this non-gated CT examination. Assessment for  potential risk factor modification, dietary therapy or  pharmacologic therapy may be warranted, if clinically indicated.  5. Small pulmonary nodules in the lungs, as above. The largest of  these is a 6 mm ground-glass attenuation nodule in the right upper  lobe (image 16 of series 3). Initial follow-up by  chest CT without  contrast is recommended in 3 months to confirm persistence. This  recommendation follows the consensus statement: Recommendations for  the Management of Subsolid Pulmonary Nodules Detected at CT: A  Statement from the Cook as published in Radiology  2013; 266:304-317.  Original Report Authenticated By: Vinnie Langton, M.D.  08/19/12- 70 yoF former smoker followed for bronchitis, hx sarcoid, complicated by GERD, glaucoma follows for:  discuss ct results  Occasional sweats, minor rash at right ankle, cough productive white, thick and occasionally yellow sputum. Some dyspnea with exertion, no chest pain or palpitation. ACE 07/08/12- 27 (8-52) CT chest 08/18/12 IMPRESSION:  1. New clustered peribronchovascular ill-defined nodularity in the  right upper lobe, indicative of an infectious bronchiolitis.  2. Previously measured 6 mm right upper lobe nodule is less  discrete on the current examination.  3. Coronary artery calcification.  Original Report Authenticated By: Lorin Picket, M.D.  02/25/13- 15 yoF former smoker followed for bronchitis/ bronchiolitis/ nodules, hx  Ocular sarcoid, complicated by GERD, glaucoma FOLLOWS FOR: review PFT, 6MW, CT Scan, and sputum cultures with patient. Pt states that she continues to have SOB with activitiy; when not active-she feels like she cant breathe Occasional cough, scant clear sputum, better with Dulera. Pains around chest, chronic, if she forces a deep breath. PFT 09/21/2012-mild obstructive airways disease with response to bronchodilator, moderate restriction, diffusion severely reduced. FVC 1.88/82%, FEV11.58/88%, FEV1/FVC 0.84, FEF 25-75% 1.86/106% with some response to bronchodilator in small airways. TLC 62%, DLCO 49%. 6MWT 09/21/12- WNL 97%, 97%, 98%, 381 m. Sputum culture-normal  flora, negative for AFB and fungus  08/25/13- 68 yoF former smoker followed for bronchitis/ bronchiolitis/ nodules, hx  Occular sarcoid,  complicated by GERD, glaucoma FOLLOWS FOR: not using routine inhalers as she should; pt has good and bad days with breathing. New problem:Pt states her husabnd has noticed she has started snoring and gasping for air-has to be rolled over or get up and catch her breath. She was also told she stops breathing in her sleep and has noticed this during the day as well. Daytime sleepiness. Dry hacking cough, postnasal drip, reflux on Nexium Recent glaucoma surgery CXR 6/ 4 /15 IMPRESSION:  No active cardiopulmonary disease.  Electronically Signed  By: Dereck Ligas M.D.  On: 07/14/2013 11:45  ROS-see HPI Constitutional:   No-   weight loss, +night sweats, fevers, chills, +fatigue, lassitude. HEENT:   No-  headaches, difficulty swallowing, tooth/dental problems, sore throat,       Some  sneezing, itching, ear ache, +nasal congestion, +post nasal drip,  CV:  +chest pain, no-orthopnea, PND, swelling in lower extremities, anasarca, dizziness, palpitations Resp: +  shortness of breath with exertion not at rest.              No-   productive cough,  + non-productive cough,  No- coughing up of blood.                No-change in color of mucus.  No- wheezing.   Skin: No-   rash or lesions. GI:  +heartburn, indigestion, no-abdominal pain, nausea, vomiting,  GU:  MS:  +  joint pain or swelling, + back pain Neuro-     nothing unusual Psych:  No- change in mood or affect. No depression or anxiety.  No memory loss.  OBJ General- Alert, Oriented, Affect anxious, Distress- none acute Skin- rash-none, lesions- none, excoriation- none Lymphadenopathy- none Head- atraumatic            Eyes- Gross vision intact, PERRLA, conjunctivae clear secretions- not injected            Ears- Hearing aid Left            Nose- sniffing, no-Septal dev, mucus, polyps, erosion, perforation             Throat- Mallampati II , mucosa clear , drainage- none, tonsils- atrophic Neck- flexible , trachea midline, no stridor ,  thyroid nl, carotid no bruit Chest - symmetrical excursion , unlabored           Heart/CV- RRR , no murmur , no gallop  , no rub, nl s1 s2                           - JVD- none , edema- none, stasis changes- none, varices- none           Lung- clear,no-cough, no wheeze , dullness-none, rub- none           Chest wall-  Abd-  Br/ Gen/ Rectal- Not done, not indicated Extrem- cyanosis- none, clubbing, none, atrophy- none, strength- nl Neuro- grossly intact to observation

## 2013-08-25 NOTE — Patient Instructions (Signed)
Order- labs ACE level  Dx Sarcoid  Sample Dulera 200     2 puffs then rinse mouth, twice daily     See if it works better to calm the cough, if you can stick with the [facility] every day  Order- schedule split protocol NPSG   Dx OSA

## 2013-09-16 DIAGNOSIS — H9209 Otalgia, unspecified ear: Secondary | ICD-10-CM | POA: Diagnosis not present

## 2013-09-30 ENCOUNTER — Other Ambulatory Visit: Payer: Self-pay | Admitting: Internal Medicine

## 2013-09-30 DIAGNOSIS — M79609 Pain in unspecified limb: Secondary | ICD-10-CM | POA: Diagnosis not present

## 2013-09-30 DIAGNOSIS — R52 Pain, unspecified: Secondary | ICD-10-CM

## 2013-09-30 DIAGNOSIS — R609 Edema, unspecified: Secondary | ICD-10-CM

## 2013-09-30 DIAGNOSIS — R0602 Shortness of breath: Secondary | ICD-10-CM | POA: Diagnosis not present

## 2013-09-30 DIAGNOSIS — IMO0001 Reserved for inherently not codable concepts without codable children: Secondary | ICD-10-CM | POA: Diagnosis not present

## 2013-09-30 DIAGNOSIS — I10 Essential (primary) hypertension: Secondary | ICD-10-CM | POA: Diagnosis not present

## 2013-09-30 DIAGNOSIS — Z87891 Personal history of nicotine dependence: Secondary | ICD-10-CM | POA: Diagnosis not present

## 2013-10-03 DIAGNOSIS — L508 Other urticaria: Secondary | ICD-10-CM | POA: Diagnosis not present

## 2013-10-18 ENCOUNTER — Ambulatory Visit (HOSPITAL_BASED_OUTPATIENT_CLINIC_OR_DEPARTMENT_OTHER): Payer: Medicare Other | Attending: Internal Medicine | Admitting: Radiology

## 2013-10-18 VITALS — Ht 60.0 in | Wt 164.0 lb

## 2013-10-18 DIAGNOSIS — I10 Essential (primary) hypertension: Secondary | ICD-10-CM | POA: Diagnosis not present

## 2013-10-18 DIAGNOSIS — E1149 Type 2 diabetes mellitus with other diabetic neurological complication: Secondary | ICD-10-CM | POA: Diagnosis not present

## 2013-10-18 DIAGNOSIS — Z0000 Encounter for general adult medical examination without abnormal findings: Secondary | ICD-10-CM | POA: Diagnosis not present

## 2013-10-18 DIAGNOSIS — G4733 Obstructive sleep apnea (adult) (pediatric): Secondary | ICD-10-CM

## 2013-10-18 DIAGNOSIS — M79609 Pain in unspecified limb: Secondary | ICD-10-CM | POA: Diagnosis not present

## 2013-10-18 DIAGNOSIS — R413 Other amnesia: Secondary | ICD-10-CM | POA: Diagnosis not present

## 2013-10-18 DIAGNOSIS — E1142 Type 2 diabetes mellitus with diabetic polyneuropathy: Secondary | ICD-10-CM | POA: Diagnosis not present

## 2013-10-20 ENCOUNTER — Other Ambulatory Visit: Payer: Self-pay | Admitting: Internal Medicine

## 2013-10-20 DIAGNOSIS — K439 Ventral hernia without obstruction or gangrene: Secondary | ICD-10-CM

## 2013-10-20 DIAGNOSIS — Z124 Encounter for screening for malignant neoplasm of cervix: Secondary | ICD-10-CM | POA: Diagnosis not present

## 2013-10-20 DIAGNOSIS — Z01419 Encounter for gynecological examination (general) (routine) without abnormal findings: Secondary | ICD-10-CM | POA: Diagnosis not present

## 2013-10-22 DIAGNOSIS — G4733 Obstructive sleep apnea (adult) (pediatric): Secondary | ICD-10-CM

## 2013-10-22 NOTE — Sleep Study (Signed)
   NAME: Sheila Reynolds DATE OF BIRTH:  1946-01-24 MEDICAL RECORD NUMBER [id number]  LOCATION: [facility]  PHYSICIAN: Niomi Valent D  DATE OF STUDY: 10/18/2013  SLEEP STUDY TYPE: Nocturnal Polysomnogram               REFERRING PHYSICIAN: Baird Lyons D, MD  INDICATION FOR STUDY: Hypersomnia with sleep apnea  EPWORTH SLEEPINESS SCORE:   21/24 HEIGHT: 5' (152.4 cm)  WEIGHT: 164 lb (74.39 kg)    Body mass index is 32.03 kg/(m^2).  NECK SIZE: 15 in.  MEDICATIONS: Chart for review  SLEEP ARCHITECTURE: Total sleep time 334.5 minutes with sleep efficiency 87.2%. Stage I was 1.6%, stage II 67%, stage III absent, REM 31.4% of total sleep time. Sleep latency 32.5 minutes, REM latency 55 minutes, awake after sleep onset 16 minutes, arousal index 3.9, bedtime medication: Lipitor  RESPIRATORY DATA: Apnea hypopneas index (AHI) 9.3 per hour. 52 total events scored including 9 obstructive apneas and 43 hypopneas. All events were while supine. REM AHI 26.3 per hour. There were not enough for early events to permit application of split protocol CPAP titration.  OXYGEN DATA: Mild snoring with oxygen desaturation to a nadir of 81% and mean saturation of 93.8% on room air.  CARDIAC DATA: Sinus rhythm with occasional PVC  MOVEMENT/PARASOMNIA: No significant movement disturbance, no bathroom trips  IMPRESSION/ RECOMMENDATION:   1) Mild obstructive sleep apnea/hypopneas syndrome, AHI 9.3 per hour with supine events. REM AHI 26.3 per hour. Mild snoring with oxygen desaturation to a nadir of 81% and mean saturation 93.8% on room air. 2) Scores in this range may respond to conservative management. On an individual basis, CPAP or an oral appliance may be appropriate.   Deneise Lever Diplomate, American Board of Sleep Medicine  ELECTRONICALLY SIGNED ON:  10/22/2013, 10:03 AM [place] PH: [phone]   FX: [phone] [place]

## 2013-10-26 ENCOUNTER — Ambulatory Visit (INDEPENDENT_AMBULATORY_CARE_PROVIDER_SITE_OTHER): Payer: Medicare Other | Admitting: Cardiovascular Disease

## 2013-10-26 ENCOUNTER — Encounter: Payer: Self-pay | Admitting: Cardiovascular Disease

## 2013-10-26 VITALS — BP 90/54 | HR 80 | Ht 60.0 in | Wt 165.0 lb

## 2013-10-26 DIAGNOSIS — I2510 Atherosclerotic heart disease of native coronary artery without angina pectoris: Secondary | ICD-10-CM | POA: Diagnosis not present

## 2013-10-26 DIAGNOSIS — I10 Essential (primary) hypertension: Secondary | ICD-10-CM | POA: Diagnosis not present

## 2013-10-26 DIAGNOSIS — I472 Ventricular tachycardia: Secondary | ICD-10-CM | POA: Diagnosis not present

## 2013-10-26 DIAGNOSIS — I4729 Other ventricular tachycardia: Secondary | ICD-10-CM | POA: Diagnosis not present

## 2013-10-26 DIAGNOSIS — R002 Palpitations: Secondary | ICD-10-CM | POA: Diagnosis not present

## 2013-10-26 MED ORDER — POTASSIUM CHLORIDE ER 10 MEQ PO TBCR: 10.0000 meq | EXTENDED_RELEASE_TABLET | Freq: Every day | ORAL | Status: DC

## 2013-10-26 MED ORDER — FUROSEMIDE 20 MG PO TABS: 20.0000 mg | ORAL_TABLET | Freq: Every day | ORAL | Status: DC

## 2013-10-26 NOTE — Assessment & Plan Note (Signed)
She continues to have palpitations - likely are due to PVCs.   She is on lasix but not KCl.  Will add Kdur 10 meq a day when she takes her Lasix. Will check BMP in 1 month. I'll see her in 6 months.

## 2013-10-26 NOTE — Assessment & Plan Note (Signed)
She has PVCs on event monitor. Continue to watch

## 2013-10-26 NOTE — Progress Notes (Signed)
Sheila Reynolds Bas Date of Birth  09/25/1945 [place] [address]    [place]   [place], [place]  [address]    [place], [place]  [address] [phone]  Fax  [phone]  [phone]  Fax [phone]  Problem list: 1. Hyperlipidemia 2. Chest pain 3. Nonsustained ventricular tachycardia 4. Mild to moderate coronary artery disease by cath in 2009. 5. 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 Keturah Barre  History of Present Illness:  Sheila Reynolds is a 68 y.o. female with the above noted hx.  She has continued to have some palpitations.  She has occasional episodes of lightheadedness when she has palpitations. Her main issue has been lots of total body cramps. She has been seen in the emergency room. Her lab work looked fine.  She's had lots of dyspnea especially with exertion. She has a history of sarcoidosis and is followed by Dr. Annamaria Boots.  March 11, 2012: Sheila Reynolds has had more palpitations recently.  It Appears that her metoprolol dose was decreased slightly.  We placed an event monitor on her. She did not have any episodes of atrial fibrillation. She has occasional drinker atrial contractions and occasional premature ventricular contractions. There were no life-threatening arrhythmias.  She is doing well from a cardiac standpoint.   She's not had any episodes of syncope.  She has significant shortness of breath especially when climbing stairs. This is likely due to her sarcoidosis. She denies any chest pain. Her BP is a bit elevated.  She avoids salt.    May 02, 2013 :  She presented to see Richardson Dopp in February with some increased palpitations and lightheadedness.  He recommended that she decrease her caffeine intake. He placed a 21 day event monitor and instructed her to take an extra metoprolol as needed.  The palpitations can occur at any time. They occur with rest and with exertion. They're not constant. The last 30-45 seconds and  occur multiple times through the day.  Sept. 16, 2015:  Still having palpitations.   She has had more leg edema and her medical doctor on Lasix which he now takes about every other day. She was not on potassium replacement. This may be the cause of her increased PVCs.  Current Outpatient Prescriptions on File Prior to Visit  Medication Sig Dispense Refill  . Albuterol Sulfate (VENTOLIN HFA IN) Inhale 2 puffs into the lungs 4 (four) times daily as needed (shortness of breath).       . ALPRAZolam (XANAX) 0.5 MG tablet Take 0.25 mg by mouth at bedtime as needed for anxiety or sleep. For sleep      . aspirin 81 MG tablet Take 81 mg by mouth daily.        [name] atorvastatin (LIPITOR) 20 MG tablet Take 20 mg by mouth daily.      . brimonidine (ALPHAGAN P) 0.1 % SOLN Place 1 drop into both eyes every 8 (eight) hours.      . brinzolamide (AZOPT) 1 % ophthalmic suspension Place 1 drop into both eyes 3 (three) times daily.       . cholecalciferol (VITAMIN D) 1000 UNITS tablet Take 1,000 Units by mouth. GOING TO START VITAMIN D INJECTIONS ONCE A WEEK THEN TITRATE TO ONCE A MONTH      . Difluprednate 0.05 % EMUL Place 1 drop into the right eye 4 (four) times daily.      [name] esomeprazole (NEXIUM) 40 MG capsule  Take 40 mg by mouth 2 (two) times daily as needed (reflux).       . hydrochlorothiazide (HYDRODIURIL) 25 MG tablet Take 25 mg by mouth 3 (three) times a week.       . hydrOXYzine (ATARAX/VISTARIL) 25 MG tablet Take 25 mg by mouth as needed for itching. ONCE IN A WHILE      . JANUMET 50-500 MG per tablet Take 1 tablet by mouth Twice daily.      [name] loteprednol (LOTEMAX) 0.5 % ophthalmic suspension Place 1 drop into the left eye 2 (two) times daily.       . metoprolol (LOPRESSOR) 50 MG tablet Take 25-50 mg by mouth 3 (three) times daily. 50mg  in the morning, 50mg  in the afternoon and 25mg  at bedtime      . mometasone-formoterol (DULERA) 200-5 MCG/ACT AERO Inhale 2 puffs into the lungs 2 (two) times daily.  1  Inhaler  0  . montelukast (SINGULAIR) 10 MG tablet Take 10 mg by mouth at bedtime.      . Olopatadine HCl (PATANASE) 0.6 % SOLN Place 1 puff into the nose daily.      . ONE TOUCH ULTRA TEST test strip 1 each by Other route as needed.       . pregabalin (LYRICA) 50 MG capsule Take 50 mg by mouth 2 (two) times daily.      . Travoprost (TRAVATAN OP) Place 1 drop into both eyes at bedtime.        No current facility-administered medications on file prior to visit.    Allergies  Allergen Reactions  . Crestor [Rosuvastatin Calcium] Other (See Comments)    muscle aches  . Demerol [Meperidine] Other (See Comments)    Hallucinations   . Shellfish Allergy     Crab, shrimp and lobster ---lips itch and tingle    Past Medical History  Diagnosis Date  . Hyperlipidemia   . Chest pain   . Nonsustained ventricular tachycardia   . Fibromyalgia   . Coronary artery disease     Mild to moderate CAD  . Palpitations   . Hx of echocardiogram     Echo (03/2013):  Tech limited; Mild focal basal septal hypertrophy, EF 60-65%, normal RVF  . Family history of anesthesia complication     daughter N/V  . Heart murmur   . Dysrhythmia     has fluttering at times  . Shortness of breath     loses breathe at times and have to take extra breathes  . Diabetes mellitus without complication   . Hypertension   . Bronchitis with emphysema   . Anxiety     on meds  . GERD (gastroesophageal reflux disease)     on meds  . Headache(784.0)   . Anemia     3 months ago anemic    Past Surgical History  Procedure Laterality Date  . Cardiac catheterization  05/04/2007    reveals overall normal left ventricular systolic function. Ejection fraction 65-70%  . Total abdominal hysterectomy  1982  . Breast surgery  approx 1992    bilateral breast surgery for fibrocystic breast disease  . Tonsillectomy    . Colonoscopy w/ biopsies and polypectomy    . Cataract extraction w/phaco Right 07/20/2013    Procedure: CATARACT  EXTRACTION PHACO AND INTRAOCULAR LENS PLACEMENT (IOC);  Surgeon: Marylynn Pearson, MD;  Location: [place];  Service: Ophthalmology;  Laterality: Right;  . Mini shunt insertion Right 07/20/2013    Procedure: INSERTION OF GLAUCOMA FILTRATION  DEVICE RIGHT EYE;  Surgeon: Marylynn Pearson, MD;  Location: [place];  Service: Ophthalmology;  Laterality: Right;  . Mitomycin c application Right 1/91/4782    Procedure: MITOMYCIN C APPLICATION;  Surgeon: Marylynn Pearson, MD;  Location: [place];  Service: Ophthalmology;  Laterality: Right;    History  Smoking status  . Former Smoker -- 1.00 packs/day for 4 years  . Types: Cigarettes  . Quit date: 02/11/1980  Smokeless tobacco  . Never Used    History  Alcohol Use No    Family History  Problem Relation Age of Onset  . Heart disease Father   . Heart disease Paternal Grandmother   . Cancer Mother     unknown type, ?lung  . Cancer Paternal Grandmother     unknown type  . Diabetes Father   . Diabetes Brother   . Glaucoma Father   . Glaucoma Brother     Reviw of Systems:  Reviewed in the HPI.  All other systems are negative.  Physical Exam: Blood pressure 90/54, pulse 80, height 5' (1.524 m), weight 165 lb (74.844 kg). General: Well developed, well nourished, in no acute distress.  Head: Normocephalic, atraumatic, sclera non-icteric, mucus membranes are moist,   Neck: Supple. Negative for carotid bruits. JVD not elevated.  Lungs: Clear bilaterally to auscultation without wheezes, rales, or rhonchi. Breathing is unlabored.  Heart: RRR with S1 S2. No murmurs, rubs, or gallops appreciated.  Abdomen: Soft, non-tender, non-distended with normoactive bowel sounds. No hepatomegaly. No rebound/guarding. No obvious abdominal masses.  Msk:  Strength and tone appear normal for age.  Extremities: No clubbing or cyanosis. No edema.  Distal pedal pulses are 2+ and equal bilaterally.  Neuro: Alert and oriented X 3. Moves all extremities spontaneously.  Psych:   Responds to questions appropriately with a normal affect.  ECG: March 11, 2012: Normal sinus rhythm at 70 beats a minute. She has occasional sinus arrhythmia. EKG is otherwise normal.  Assessment / Plan:

## 2013-10-26 NOTE — Assessment & Plan Note (Signed)
BP is ok Continue current meds

## 2013-10-26 NOTE — Patient Instructions (Signed)
Your physician has recommended you make the following change in your medication:  CONTINUE Lasix 20 mg every other day START KDur (Potassium supplement) 10 meq - take with Lasix  Your physician recommends that you return for lab work in: 1 month (Basic Metabolic Panel)  Your physician wants you to follow-up in: 6 months with Dr. Acie Fredrickson.  You will receive a reminder letter in the mail two months in advance. If you don't receive a letter, please call our office to schedule the follow-up appointment.

## 2013-10-27 DIAGNOSIS — S92309A Fracture of unspecified metatarsal bone(s), unspecified foot, initial encounter for closed fracture: Secondary | ICD-10-CM | POA: Diagnosis not present

## 2013-10-27 DIAGNOSIS — E119 Type 2 diabetes mellitus without complications: Secondary | ICD-10-CM | POA: Diagnosis not present

## 2013-10-27 DIAGNOSIS — M7989 Other specified soft tissue disorders: Secondary | ICD-10-CM | POA: Diagnosis not present

## 2013-10-28 ENCOUNTER — Ambulatory Visit
Admission: RE | Admit: 2013-10-28 | Discharge: 2013-10-28 | Disposition: A | Discharge status: Home / Self Care | Payer: Medicare Other | Source: Ambulatory Visit | Attending: Internal Medicine | Admitting: Internal Medicine

## 2013-10-28 DIAGNOSIS — K5900 Constipation, unspecified: Secondary | ICD-10-CM | POA: Diagnosis not present

## 2013-10-28 DIAGNOSIS — Z90710 Acquired absence of both cervix and uterus: Secondary | ICD-10-CM | POA: Diagnosis not present

## 2013-10-28 DIAGNOSIS — K429 Umbilical hernia without obstruction or gangrene: Secondary | ICD-10-CM | POA: Diagnosis not present

## 2013-10-28 DIAGNOSIS — K439 Ventral hernia without obstruction or gangrene: Secondary | ICD-10-CM

## 2013-10-28 MED ORDER — IOHEXOL 300 MG/ML  SOLN
100.0000 mL | Freq: Once | INTRAMUSCULAR | Status: AC | PRN
  Administered 2013-10-28: 100 mL via INTRAVENOUS

## 2013-10-31 DIAGNOSIS — E1149 Type 2 diabetes mellitus with other diabetic neurological complication: Secondary | ICD-10-CM | POA: Diagnosis not present

## 2013-10-31 DIAGNOSIS — R488 Other symbolic dysfunctions: Secondary | ICD-10-CM | POA: Diagnosis not present

## 2013-10-31 DIAGNOSIS — I10 Essential (primary) hypertension: Secondary | ICD-10-CM | POA: Diagnosis not present

## 2013-10-31 DIAGNOSIS — D518 Other vitamin B12 deficiency anemias: Secondary | ICD-10-CM | POA: Diagnosis not present

## 2013-10-31 DIAGNOSIS — Z23 Encounter for immunization: Secondary | ICD-10-CM | POA: Diagnosis not present

## 2013-11-01 ENCOUNTER — Ambulatory Visit (INDEPENDENT_AMBULATORY_CARE_PROVIDER_SITE_OTHER): Payer: Medicare Other | Admitting: Internal Medicine

## 2013-11-01 ENCOUNTER — Encounter: Payer: Self-pay | Admitting: Internal Medicine

## 2013-11-01 VITALS — BP 134/70 | HR 75 | Ht 62.0 in | Wt 162.0 lb

## 2013-11-01 DIAGNOSIS — G4733 Obstructive sleep apnea (adult) (pediatric): Secondary | ICD-10-CM | POA: Diagnosis not present

## 2013-11-01 DIAGNOSIS — D869 Sarcoidosis, unspecified: Secondary | ICD-10-CM

## 2013-11-01 DIAGNOSIS — I2510 Atherosclerotic heart disease of native coronary artery without angina pectoris: Secondary | ICD-10-CM | POA: Diagnosis not present

## 2013-11-01 NOTE — Progress Notes (Signed)
02/05/11- 66 yoF former smoker seen on referral from Dr Estanislado Pandy for concern of sarcoid. 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 Shortness of breath has been chronic with one flight of stairs. She thinks she may be gradually getting worse. There is chronic dry cough which is been worse since a flulike illness 3 weeks ago. 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 I can't tell a specific arthritis diagnosis has been made. There is no history of liver or kidney disease. Bronchitis was diagnosed at the time of allergy skin testing a year ago. Food sensitivity to shrimp and crab which make her lips itch. Positive skin test also for grass and house dust. She has not been on allergy vaccine. A rescue inhaler has helped at times. 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 Advair was no help and Symbicort may have helped only a little. ACE 46 (8-52) 09/30/10 Medication choices been limited by glaucoma. No history of TB exposure. Past history of sinus infections. Active problem with heartburn which is partly suppressed with Nexium once or twice daily/ Dr Collene Mares. Married, now on short-term disability. Quit smoking 1980 Several family members on her father's side had sarcoid.  03/10/11- 66 yoF former smoker followed for bronchitis, hx sarcoid, complicated by GERD, glaucoma Dulera inhaler did not have significant improvement. She is off of methotrexate and prednisone as of this month. 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 She occasionally wakes coughing with thick white mucus, stress incontinence. Just occasionally tight. DOE with one flight of stairs and has to stop at the top to get her breath but  no longer has to catch her breath halfway down stairs carrying laundry. Tussionex no longer works as well for cough as it used to.  07/08/11- 65 yoF former smoker followed for bronchitis, hx sarcoid, complicated by GERD, glaucoma Iincreased cough and congestion(nasal and chest)Productive-green/yellow in color x 3 days. Denies any fever but has been having chills. SOB and wheezing along with cough. SOB is all the time. Need to check if okay to start back on Methtrexate again. Dr Estanislado Pandy has been following for sarcoid of the eye and wants to resume methotrexate. There has been increased cough, productive of  mucus, worse at night and supine. Maybe some chills. Stress incontinence. Arthralgias. Postnasal drip. GERD is fairly well controlled with Nexium. Taking tramadol now for pain so is not helping the cough. Failed benzonatate. This was a prolonged visit 40 minutes. CXR 02/09/11-images reviewed with her. IMPRESSION:  Stable examination. No active cardiopulmonary process.  Original Report Authenticated By: Vivia Ewing, M.D.    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  Will see Dr.Deveshwar next month for decision about restarting methotrexate. Persistent pain mid anterior chest through to back, worse with prolonged standing and kitchen, eased by leaning over. No reflux or dysphagia. Expects seasonal bronchitis but denies routine cough or dyspnea, fever or sweats. CXR 07/10/11- negative. ACE 07/08/11- 40/ WNL.  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   FOLLOWS FOR: still having problems with SOB with activity. No change in complaints of pain, mid anterior chest, left greater than right mostly around into her back and with prolonged standing she has to stop kitchen work to sit down. We discussed this as more of a back problem and a chest problem. She has seen Dr.Van Creed Copper w/ [facility] . Dr. Estanislado Pandy is treating with ibuprofen.  07/08/12- 85 yoF former smoker followed for bronchitis, hx sarcoid, complicated by GERD, glaucoma FOLLOWS FOR: went for ROV at [facility]-was told allergies were flared up and Deveshwar MD stated Sarcoid was flared up as well. Was given Rx's(currently on Prednisone taper) continues to cough-green and yellow with streaks of blood at times. (Dr Creed Copper) Complains of backache. Rhinitis is responded to nasal spray, Singulair, Allegra. Dr.Deveshwar feels for sarcoid might be flaring. She prescribed prednisone, methotrexate, folic acid. ACE level 07/08/11- 40 (8-52) CT chest 05/19/12 IMPRESSION:  . No explanation for the patient's history of back pain.  2. Despite the reported clinical history of sarcoidosis, there are  no imaging findings to suggest a diagnosis of sarcoidosis at this  time in this patient. Clinical correlation is recommended.  3. Mild centrilobular emphysema.  4. Atherosclerosis, including left anterior descending coronary  artery disease. Please note that although the presence of coronary  artery calcium documents the presence of coronary artery disease,  the severity of this disease and any potential stenosis cannot be  assessed on this non-gated CT examination. Assessment for  potential risk factor modification, dietary therapy or  pharmacologic therapy may be warranted, if clinically indicated.  5. Small pulmonary nodules in the lungs, as above. The largest of  these is a 6 mm ground-glass attenuation nodule in the right upper  lobe (image 16 of series 3). Initial follow-up by  chest CT without  contrast is recommended in 3 months to confirm persistence. This  recommendation follows the consensus statement: Recommendations for  the Management of Subsolid Pulmonary Nodules Detected at CT: A  Statement from the Cook as published in Radiology  2013; 266:304-317.  Original Report Authenticated By: Vinnie Langton, M.D.  08/19/12- 70 yoF former smoker followed for bronchitis, hx sarcoid, complicated by GERD, glaucoma follows for:  discuss ct results  Occasional sweats, minor rash at right ankle, cough productive white, thick and occasionally yellow sputum. Some dyspnea with exertion, no chest pain or palpitation. ACE 07/08/12- 27 (8-52) CT chest 08/18/12 IMPRESSION:  1. New clustered peribronchovascular ill-defined nodularity in the  right upper lobe, indicative of an infectious bronchiolitis.  2. Previously measured 6 mm right upper lobe nodule is less  discrete on the current examination.  3. Coronary artery calcification.  Original Report Authenticated By: Lorin Picket, M.D.  02/25/13- 15 yoF former smoker followed for bronchitis/ bronchiolitis/ nodules, hx  Ocular sarcoid, complicated by GERD, glaucoma FOLLOWS FOR: review PFT, 6MW, CT Scan, and sputum cultures with patient. Pt states that she continues to have SOB with activitiy; when not active-she feels like she cant breathe Occasional cough, scant clear sputum, better with Dulera. Pains around chest, chronic, if she forces a deep breath. PFT 09/21/2012-mild obstructive airways disease with response to bronchodilator, moderate restriction, diffusion severely reduced. FVC 1.88/82%, FEV11.58/88%, FEV1/FVC 0.84, FEF 25-75% 1.86/106% with some response to bronchodilator in small airways. TLC 62%, DLCO 49%. 6MWT 09/21/12- WNL 97%, 97%, 98%, 381 m. Sputum culture-normal  flora, negative for AFB and fungus  08/25/13- 68 yoF former smoker followed for bronchitis/ bronchiolitis/ nodules, hx  Ocular sarcoid,  complicated by GERD, glaucoma FOLLOWS FOR: not using routine inhalers as she should; pt has good and bad days with breathing. Pt states her husabnd has noticed she has started snoring and gasping for air-has to be rolled over or get up and catch her breath. She was also told she stops breathing in her sleep and has noticed this during the day as well.  CXR 6/ 4 /15 IMPRESSION:  No active cardiopulmonary disease.  Electronically Signed  By: Dereck Ligas M.D.  On: 07/14/2013 11:45  11/01/13-68 yoF former smoker followed for bronchitis/ bronchiolitis/ nodules, hx  Ocular sarcoid, OSA complicated by GERD, glaucoma FOLLOWS FOR: Pt c/o increased SOB with exertion, prod cough with clear to yellow mucus.  Review sleep study.  NPSG-10/18/13- Mild OSA, AHI 9.3/ hr, weight 164 lbs  To start CPAP auto  ROS-see HPI Constitutional:   No-   weight loss, +night sweats, fevers, chills, fatigue, lassitude. HEENT:   No-  headaches, difficulty swallowing, tooth/dental problems, sore throat,       Some  sneezing, itching, ear ache, +nasal congestion, post nasal drip,  CV:  +chest pain, no-orthopnea, PND, swelling in lower extremities, anasarca, dizziness, palpitations Resp: +  shortness of breath with exertion not at rest.              No-   productive cough,  + non-productive cough,  No- coughing up of blood.                No-change in color of mucus.  No- wheezing.   Skin: No-   rash or lesions. GI:  No-   heartburn, indigestion, abdominal pain, nausea, vomiting,  GU:  MS:  +  joint pain or swelling, + back pain Neuro-     nothing unusual Psych:  No- change in mood or affect. No depression or anxiety.  No memory loss.  OBJ General- Alert, Oriented, Affect anxious, Distress- none acute Skin- rash-none, lesions- none, excoriation- none Lymphadenopathy- none Head- atraumatic            Eyes- Gross vision intact, PERRLA, conjunctivae clear secretions- not injected            Ears- Hearing aid Left             Nose- sniffing, no-Septal dev, mucus, polyps, erosion, perforation             Throat- Mallampati II , mucosa clear , drainage- none, tonsils- atrophic Neck- flexible , trachea midline, no stridor , thyroid nl, carotid no bruit Chest - symmetrical excursion , unlabored           Heart/CV- RRR , no murmur , no gallop  , no rub, nl s1 s2                           - JVD- none , edema- none, stasis changes- none, varices- none           Lung- clear,no-cough, no wheeze , dullness-none, rub- none           Chest wall-  Abd-  Br/ Gen/ Rectal- Not done, not indicated Extrem- cyanosis- none, clubbing, none, atrophy- none, strength- nl. +Boot L foot stress fx Neuro- grossly intact to observation

## 2013-11-01 NOTE — Patient Instructions (Signed)
Order- new DME new CPAP autotitrate 5-15 for pressure recommendation, mask of choice, supplies, humidifier  Dx OSA  Please call as needed

## 2013-11-03 DIAGNOSIS — R1900 Intra-abdominal and pelvic swelling, mass and lump, unspecified site: Secondary | ICD-10-CM | POA: Diagnosis not present

## 2013-11-03 DIAGNOSIS — N951 Menopausal and female climacteric states: Secondary | ICD-10-CM | POA: Diagnosis not present

## 2013-11-03 DIAGNOSIS — S92909A Unspecified fracture of unspecified foot, initial encounter for closed fracture: Secondary | ICD-10-CM | POA: Diagnosis not present

## 2013-11-04 DIAGNOSIS — G4733 Obstructive sleep apnea (adult) (pediatric): Secondary | ICD-10-CM | POA: Insufficient documentation

## 2013-11-04 NOTE — Assessment & Plan Note (Signed)
Controlled.  

## 2013-11-04 NOTE — Assessment & Plan Note (Signed)
Mild OSA. We discussed treatment choices. She will try CPAP first, with autotitration. Oral appliance may be reasonable plan B

## 2013-11-07 DIAGNOSIS — D518 Other vitamin B12 deficiency anemias: Secondary | ICD-10-CM | POA: Diagnosis not present

## 2013-11-07 DIAGNOSIS — I10 Essential (primary) hypertension: Secondary | ICD-10-CM | POA: Diagnosis not present

## 2013-11-15 DIAGNOSIS — H4043X3 Glaucoma secondary to eye inflammation, bilateral, severe stage: Secondary | ICD-10-CM | POA: Diagnosis not present

## 2013-11-17 DIAGNOSIS — D519 Vitamin B12 deficiency anemia, unspecified: Secondary | ICD-10-CM | POA: Diagnosis not present

## 2013-11-17 DIAGNOSIS — M79672 Pain in left foot: Secondary | ICD-10-CM | POA: Diagnosis not present

## 2013-11-17 DIAGNOSIS — S92312D Displaced fracture of first metatarsal bone, left foot, subsequent encounter for fracture with routine healing: Secondary | ICD-10-CM | POA: Diagnosis not present

## 2013-11-24 ENCOUNTER — Other Ambulatory Visit: Payer: Medicare Other

## 2013-11-27 NOTE — Assessment & Plan Note (Signed)
Plan-sample Dulera 200 with discussion

## 2013-11-27 NOTE — Assessment & Plan Note (Signed)
Management options reviewed and sleep study was scheduled

## 2013-11-27 NOTE — Assessment & Plan Note (Signed)
Try Nexium twice daily. May need GI referral

## 2013-11-27 NOTE — Assessment & Plan Note (Signed)
Probably not systemically active Plan-ACE level

## 2013-11-30 DIAGNOSIS — H4040X3 Glaucoma secondary to eye inflammation, unspecified eye, severe stage: Secondary | ICD-10-CM | POA: Diagnosis not present

## 2013-12-07 DIAGNOSIS — S92312D Displaced fracture of first metatarsal bone, left foot, subsequent encounter for fracture with routine healing: Secondary | ICD-10-CM | POA: Diagnosis not present

## 2013-12-12 ENCOUNTER — Encounter: Payer: Self-pay | Admitting: Internal Medicine

## 2013-12-15 DIAGNOSIS — N951 Menopausal and female climacteric states: Secondary | ICD-10-CM | POA: Diagnosis not present

## 2013-12-15 DIAGNOSIS — Z13820 Encounter for screening for osteoporosis: Secondary | ICD-10-CM | POA: Diagnosis not present

## 2013-12-15 DIAGNOSIS — D519 Vitamin B12 deficiency anemia, unspecified: Secondary | ICD-10-CM | POA: Diagnosis not present

## 2013-12-19 DIAGNOSIS — H4040X3 Glaucoma secondary to eye inflammation, unspecified eye, severe stage: Secondary | ICD-10-CM | POA: Diagnosis not present

## 2013-12-21 DIAGNOSIS — M84375S Stress fracture, left foot, sequela: Secondary | ICD-10-CM | POA: Diagnosis not present

## 2013-12-27 ENCOUNTER — Other Ambulatory Visit: Payer: Self-pay | Admitting: Cardiovascular Disease

## 2014-01-02 ENCOUNTER — Encounter: Payer: Self-pay | Admitting: Internal Medicine

## 2014-01-02 ENCOUNTER — Ambulatory Visit: Payer: Medicare Other | Admitting: Internal Medicine

## 2014-01-02 ENCOUNTER — Encounter (INDEPENDENT_AMBULATORY_CARE_PROVIDER_SITE_OTHER): Payer: Self-pay

## 2014-01-02 VITALS — BP 114/70 | HR 76 | Ht 62.0 in | Wt 166.0 lb

## 2014-01-02 DIAGNOSIS — G4733 Obstructive sleep apnea (adult) (pediatric): Secondary | ICD-10-CM

## 2014-01-02 DIAGNOSIS — D869 Sarcoidosis, unspecified: Secondary | ICD-10-CM

## 2014-01-02 DIAGNOSIS — R918 Other nonspecific abnormal finding of lung field: Secondary | ICD-10-CM

## 2014-01-02 NOTE — Patient Instructions (Addendum)
Order- DME [facility]- change CPAP to 9 cwp fixed. Also please teach how to adjust humidifier.     Dx OSA  Consider trying the otc saline nasal gel we talked about, to help dry nose.   Please call as needed

## 2014-01-02 NOTE — Progress Notes (Signed)
02/05/11- 66 yoF former smoker seen on referral from Dr Estanislado Pandy for concern of sarcoid. 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 Shortness of breath has been chronic with one flight of stairs. She thinks she may be gradually getting worse. There is chronic dry cough which is been worse since a flulike illness 3 weeks ago. 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 I can't tell a specific arthritis diagnosis has been made. There is no history of liver or kidney disease. Bronchitis was diagnosed at the time of allergy skin testing a year ago. Food sensitivity to shrimp and crab which make her lips itch. Positive skin test also for grass and house dust. She has not been on allergy vaccine. A rescue inhaler has helped at times. 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 Advair was no help and Symbicort may have helped only a little. ACE 46 (8-52) 09/30/10 Medication choices been limited by glaucoma. No history of TB exposure. Past history of sinus infections. Active problem with heartburn which is partly suppressed with Nexium once or twice daily/ Dr Collene Mares. Married, now on short-term disability. Quit smoking 1980 Several family members on her father's side had sarcoid.  03/10/11- 66 yoF former smoker followed for bronchitis, hx sarcoid, complicated by GERD, glaucoma Dulera inhaler did not have significant improvement. She is off of methotrexate and prednisone as of this month. 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 She occasionally wakes coughing with thick white mucus, stress incontinence. Just occasionally tight. DOE with one flight of stairs and has to stop at the top to get her breath but  no longer has to catch her breath halfway down stairs carrying laundry. Tussionex no longer works as well for cough as it used to.  07/08/11- 65 yoF former smoker followed for bronchitis, hx sarcoid, complicated by GERD, glaucoma Iincreased cough and congestion(nasal and chest)Productive-green/yellow in color x 3 days. Denies any fever but has been having chills. SOB and wheezing along with cough. SOB is all the time. Need to check if okay to start back on Methtrexate again. Dr Estanislado Pandy has been following for sarcoid of the eye and wants to resume methotrexate. There has been increased cough, productive of  mucus, worse at night and supine. Maybe some chills. Stress incontinence. Arthralgias. Postnasal drip. GERD is fairly well controlled with Nexium. Taking tramadol now for pain so is not helping the cough. Failed benzonatate. This was a prolonged visit 40 minutes. CXR 02/09/11-images reviewed with her. IMPRESSION:  Stable examination. No active cardiopulmonary process.  Original Report Authenticated By: Vivia Ewing, M.D.    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  Will see Dr.Deveshwar next month for decision about restarting methotrexate. Persistent pain mid anterior chest through to back, worse with prolonged standing and kitchen, eased by leaning over. No reflux or dysphagia. Expects seasonal bronchitis but denies routine cough or dyspnea, fever or sweats. CXR 07/10/11- negative. ACE 07/08/11- 40/ WNL.  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   FOLLOWS FOR: still having problems with SOB with activity. No change in complaints of pain, mid anterior chest, left greater than right mostly around into her back and with prolonged standing she has to stop kitchen work to sit down. We discussed this as more of a back problem and a chest problem. She has seen Dr.Van Creed Copper w/ [facility] . Dr. Estanislado Pandy is treating with ibuprofen.  07/08/12- 85 yoF former smoker followed for bronchitis, hx sarcoid, complicated by GERD, glaucoma FOLLOWS FOR: went for ROV at [facility]-was told allergies were flared up and Deveshwar MD stated Sarcoid was flared up as well. Was given Rx's(currently on Prednisone taper) continues to cough-green and yellow with streaks of blood at times. (Dr Creed Copper) Complains of backache. Rhinitis is responded to nasal spray, Singulair, Allegra. Dr.Deveshwar feels for sarcoid might be flaring. She prescribed prednisone, methotrexate, folic acid. ACE level 07/08/11- 40 (8-52) CT chest 05/19/12 IMPRESSION:  . No explanation for the patient's history of back pain.  2. Despite the reported clinical history of sarcoidosis, there are  no imaging findings to suggest a diagnosis of sarcoidosis at this  time in this patient. Clinical correlation is recommended.  3. Mild centrilobular emphysema.  4. Atherosclerosis, including left anterior descending coronary  artery disease. Please note that although the presence of coronary  artery calcium documents the presence of coronary artery disease,  the severity of this disease and any potential stenosis cannot be  assessed on this non-gated CT examination. Assessment for  potential risk factor modification, dietary therapy or  pharmacologic therapy may be warranted, if clinically indicated.  5. Small pulmonary nodules in the lungs, as above. The largest of  these is a 6 mm ground-glass attenuation nodule in the right upper  lobe (image 16 of series 3). Initial follow-up by  chest CT without  contrast is recommended in 3 months to confirm persistence. This  recommendation follows the consensus statement: Recommendations for  the Management of Subsolid Pulmonary Nodules Detected at CT: A  Statement from the Cook as published in Radiology  2013; 266:304-317.  Original Report Authenticated By: Vinnie Langton, M.D.  08/19/12- 70 yoF former smoker followed for bronchitis, hx sarcoid, complicated by GERD, glaucoma follows for:  discuss ct results  Occasional sweats, minor rash at right ankle, cough productive white, thick and occasionally yellow sputum. Some dyspnea with exertion, no chest pain or palpitation. ACE 07/08/12- 27 (8-52) CT chest 08/18/12 IMPRESSION:  1. New clustered peribronchovascular ill-defined nodularity in the  right upper lobe, indicative of an infectious bronchiolitis.  2. Previously measured 6 mm right upper lobe nodule is less  discrete on the current examination.  3. Coronary artery calcification.  Original Report Authenticated By: Lorin Picket, M.D.  02/25/13- 15 yoF former smoker followed for bronchitis/ bronchiolitis/ nodules, hx  Ocular sarcoid, complicated by GERD, glaucoma FOLLOWS FOR: review PFT, 6MW, CT Scan, and sputum cultures with patient. Pt states that she continues to have SOB with activitiy; when not active-she feels like she cant breathe Occasional cough, scant clear sputum, better with Dulera. Pains around chest, chronic, if she forces a deep breath. PFT 09/21/2012-mild obstructive airways disease with response to bronchodilator, moderate restriction, diffusion severely reduced. FVC 1.88/82%, FEV11.58/88%, FEV1/FVC 0.84, FEF 25-75% 1.86/106% with some response to bronchodilator in small airways. TLC 62%, DLCO 49%. 6MWT 09/21/12- WNL 97%, 97%, 98%, 381 m. Sputum culture-normal  flora, negative for AFB and fungus  08/25/13- 68 yoF former smoker followed for bronchitis/ bronchiolitis/ nodules, hx  Ocular sarcoid,  complicated by GERD, glaucoma FOLLOWS FOR: not using routine inhalers as she should; pt has good and bad days with breathing. Pt states her husabnd has noticed she has started snoring and gasping for air-has to be rolled over or get up and catch her breath. She was also told she stops breathing in her sleep and has noticed this during the day as well. CXR 6/ 4 /15 IMPRESSION:  No active cardiopulmonary disease.  Electronically Signed  By: Dereck Ligas M.D.  On: 07/14/2013 11:45  11/01/13-68 yoF former smoker followed for bronchitis/ bronchiolitis/ nodules, hx  Ocular sarcoid, OSA complicated by GERD, glaucoma FOLLOWS FOR: Pt c/o increased SOB with exertion, prod cough with clear to yellow mucus.  Review sleep study.  NPSG-10/18/13- Mild OSA, AHI 9.3/ hr, weight 164 lbs  To start CPAP auto  01/02/14- 68 yoF former smoker followed for bronchitis/ bronchiolitis/ nodules, hx  Ocular sarcoid, OSA complicated by GERD, glaucoma FOLLOWS FOR: Pt complains of increased coughing with production of white mucus. Has been using CPAP every night for about 7-9 hours. Mask is causing nose to become dry and causing sores in her nose. Talked with [facility] about this. Is not sleeping all night like she would like to. Fullface mask, auto 5-15/[facility] We reviewed chest x-ray showing bone island in the left rib-it was not a lung nodule after all  ROS-see HPI Constitutional:   No-   weight loss, +night sweats, fevers, chills, fatigue, lassitude. HEENT:   No-  headaches, difficulty swallowing, tooth/dental problems, sore throat,       Some  sneezing, itching, ear ache, +nasal congestion, post nasal drip,  CV:  +chest pain, no-orthopnea, PND, swelling in lower extremities, anasarca, dizziness, palpitations Resp: +  shortness of breath with exertion not at rest.              No-   productive cough,  + non-productive cough,  No- coughing up of blood.                No-change in color of mucus.  No- wheezing.   Skin: No-    rash or lesions. GI:  No-   heartburn, indigestion, abdominal pain, nausea, vomiting,  GU:  MS:  +  joint pain or swelling, + back pain Neuro-     nothing unusual Psych:  No- change in mood or affect. No depression or anxiety.  No memory loss.  OBJ General- Alert, Oriented, Affect anxious, Distress- none acute Skin- rash-none, lesions- none, excoriation- none Lymphadenopathy- none Head- atraumatic            Eyes- Gross vision intact, PERRLA, conjunctivae clear secretions- not injected            Ears- Hearing aid Left            Nose- + small scab right external nose, no-Septal dev, mucus, polyps, erosion, perforation             Throat- Mallampati II , mucosa clear , drainage- none, tonsils- atrophic Neck- flexible , trachea midline, no stridor , thyroid nl, carotid no bruit Chest - symmetrical excursion , unlabored           Heart/CV- RRR , no murmur , no gallop  , no rub, nl s1 s2                           -  JVD- none , edema- none, stasis changes- none, varices- none           Lung- clear,no-cough, no wheeze , dullness-none, rub- none           Chest wall-  Abd-  Br/ Gen/ Rectal- Not done, not indicated Extrem- cyanosis- none, clubbing, none, atrophy- none, strength- nl. +Boot L foot stress fx Neuro- grossly intact to observation

## 2014-01-06 NOTE — Assessment & Plan Note (Signed)
2 reduced airflow/crying we are going to try reducing her pressure with a change from AutoPap to 9

## 2014-01-06 NOTE — Assessment & Plan Note (Signed)
Mostly areas of scarring residual from sarcoid. No concerning lesions at this time

## 2014-01-06 NOTE — Assessment & Plan Note (Signed)
Controlled. Potential symptoms of recurrence were reviewed

## 2014-01-12 DIAGNOSIS — E1165 Type 2 diabetes mellitus with hyperglycemia: Secondary | ICD-10-CM | POA: Diagnosis not present

## 2014-01-12 DIAGNOSIS — I10 Essential (primary) hypertension: Secondary | ICD-10-CM | POA: Diagnosis not present

## 2014-01-12 DIAGNOSIS — D519 Vitamin B12 deficiency anemia, unspecified: Secondary | ICD-10-CM | POA: Diagnosis not present

## 2014-01-18 DIAGNOSIS — M84375S Stress fracture, left foot, sequela: Secondary | ICD-10-CM | POA: Diagnosis not present

## 2014-02-01 DIAGNOSIS — J019 Acute sinusitis, unspecified: Secondary | ICD-10-CM | POA: Diagnosis not present

## 2014-02-01 DIAGNOSIS — J4540 Moderate persistent asthma, uncomplicated: Secondary | ICD-10-CM | POA: Diagnosis not present

## 2014-02-01 DIAGNOSIS — J3089 Other allergic rhinitis: Secondary | ICD-10-CM | POA: Diagnosis not present

## 2014-02-01 DIAGNOSIS — J301 Allergic rhinitis due to pollen: Secondary | ICD-10-CM | POA: Diagnosis not present

## 2014-02-08 ENCOUNTER — Telehealth: Payer: Self-pay | Admitting: Internal Medicine

## 2014-02-08 DIAGNOSIS — J209 Acute bronchitis, unspecified: Secondary | ICD-10-CM

## 2014-02-08 MED ORDER — AZITHROMYCIN 250 MG PO TABS: ORAL_TABLET | ORAL | Status: DC

## 2014-02-08 MED ORDER — PROMETHAZINE-CODEINE 6.25-10 MG/5ML PO SYRP: 5.0000 mL | ORAL_SOLUTION | Freq: Four times a day (QID) | ORAL | Status: DC | PRN

## 2014-02-08 NOTE — Telephone Encounter (Signed)
Offer prometh codeine cough syrup, 200 ml, 5 ml every 6 hours if needed for cough, Z pak.           Can she come by tomorrow morning for an outpatient CXR for dx acute bronchitis?

## 2014-02-08 NOTE — Telephone Encounter (Signed)
We are seeing a lot of this viral respiratory infection around. Antibiotics won't help much. If she is really tight and wheezy, we would send prednisone. Otherwise Mucinex, extra fluids, and an otc cough syrup like Delsym may be the best bet.

## 2014-02-08 NOTE — Telephone Encounter (Signed)
Called and spoke to pt. Pt c/o increase in SOB, prod cough with yellow mucus, chest tightness and congestion. Pt stated she was started on Cefdinir 300mg  BID x 10 days and was started on it on 02/01/14 and it has only helped slightly. Pt stated her head congestion has resolved but is now has more chest congestion. Pt denies f/c/s. Pt last seen on 01/02/14.  Dr Annamaria Boots please advise.  Allergies  Allergen Reactions  . Crestor [Rosuvastatin Calcium] Other (See Comments)    muscle aches  . Demerol [Meperidine] Other (See Comments)    Hallucinations   . Shellfish Allergy     Crab, shrimp and lobster ---lips itch and tingle     Current Outpatient Prescriptions on File Prior to Visit  Medication Sig Dispense Refill  . Albuterol Sulfate (VENTOLIN HFA IN) Inhale 2 puffs into the lungs 4 (four) times daily as needed (shortness of breath).     . ALPRAZolam (XANAX) 0.5 MG tablet Take 0.25 mg by mouth at bedtime as needed for anxiety or sleep. For sleep    . aspirin 81 MG tablet Take 81 mg by mouth daily.      [name] atorvastatin (LIPITOR) 20 MG tablet TAKE 1 TABLET BY MOUTH EVERY DAY 30 tablet 6  . brimonidine (ALPHAGAN P) 0.1 % SOLN Place 1 drop into both eyes every 8 (eight) hours.    . brinzolamide (AZOPT) 1 % ophthalmic suspension Place 1 drop into both eyes 3 (three) times daily.     . cholecalciferol (VITAMIN D) 1000 UNITS tablet Take 1,000 Units by mouth. 1 capsule every 3 months    . esomeprazole (NEXIUM) 40 MG capsule Take 40 mg by mouth 2 (two) times daily as needed (reflux).     . furosemide (LASIX) 20 MG tablet Take 1 tablet (20 mg total) by mouth daily. (Patient taking differently: 1 tablet three times a week) 30 tablet 11  . hydrochlorothiazide (HYDRODIURIL) 25 MG tablet Take 25 mg by mouth 3 (three) times a week.     . hydrOXYzine (ATARAX/VISTARIL) 25 MG tablet Take 25 mg by mouth as needed for itching. ONCE IN A WHILE    . JANUMET 50-500 MG per tablet Take 1 tablet by mouth Twice daily.     [name] loteprednol (LOTEMAX) 0.5 % ophthalmic suspension Place 1 drop into the left eye 2 (two) times daily.     . metoprolol (LOPRESSOR) 50 MG tablet TAKE 1 TABLET BY MOUTH 2 TIMES DAILY. 60 tablet 6  . mometasone-formoterol (DULERA) 200-5 MCG/ACT AERO Inhale 2 puffs into the lungs 2 (two) times daily. 1 Inhaler 0  . montelukast (SINGULAIR) 10 MG tablet Take 10 mg by mouth at bedtime.    . Olopatadine HCl (PATANASE) 0.6 % SOLN Place 1 puff into the nose daily.    . ONE TOUCH ULTRA TEST test strip 1 each by Other route as needed.     . potassium chloride (K-DUR) 10 MEQ tablet Take 1 tablet (10 mEq total) by mouth daily. 90 tablet 3  . pregabalin (LYRICA) 100 MG capsule Take 100 mg by mouth 2 (two) times daily.    . Travoprost (TRAVATAN OP) Place 1 drop into both eyes at bedtime.      No current facility-administered medications on file prior to visit.

## 2014-02-08 NOTE — Telephone Encounter (Signed)
Called and spoke to pt. Informed pt of the recs per CY. Rx sent and called into pharmacy. CXR order placed. Pt verbalized understanding and denied any further questions or concerns at this time.

## 2014-02-08 NOTE — Telephone Encounter (Signed)
Called and spoke with pt and she stated that she has had the 4 days of prednisone and the delsym does not work for her.  She wanted to know if there is anything else that can be taken for this since she is feeling so bad.  CY please advise of any other recs for her.  Thanks  Allergies  Allergen Reactions  . Crestor [Rosuvastatin Calcium] Other (See Comments)    muscle aches  . Demerol [Meperidine] Other (See Comments)    Hallucinations   . Shellfish Allergy     Crab, shrimp and lobster ---lips itch and tingle    Current Outpatient Prescriptions on File Prior to Visit  Medication Sig Dispense Refill  . Albuterol Sulfate (VENTOLIN HFA IN) Inhale 2 puffs into the lungs 4 (four) times daily as needed (shortness of breath).     . ALPRAZolam (XANAX) 0.5 MG tablet Take 0.25 mg by mouth at bedtime as needed for anxiety or sleep. For sleep    . aspirin 81 MG tablet Take 81 mg by mouth daily.      [name] atorvastatin (LIPITOR) 20 MG tablet TAKE 1 TABLET BY MOUTH EVERY DAY 30 tablet 6  . brimonidine (ALPHAGAN P) 0.1 % SOLN Place 1 drop into both eyes every 8 (eight) hours.    . brinzolamide (AZOPT) 1 % ophthalmic suspension Place 1 drop into both eyes 3 (three) times daily.     . cholecalciferol (VITAMIN D) 1000 UNITS tablet Take 1,000 Units by mouth. 1 capsule every 3 months    . esomeprazole (NEXIUM) 40 MG capsule Take 40 mg by mouth 2 (two) times daily as needed (reflux).     . furosemide (LASIX) 20 MG tablet Take 1 tablet (20 mg total) by mouth daily. (Patient taking differently: 1 tablet three times a week) 30 tablet 11  . hydrochlorothiazide (HYDRODIURIL) 25 MG tablet Take 25 mg by mouth 3 (three) times a week.     . hydrOXYzine (ATARAX/VISTARIL) 25 MG tablet Take 25 mg by mouth as needed for itching. ONCE IN A WHILE    . JANUMET 50-500 MG per tablet Take 1 tablet by mouth Twice daily.    [name] loteprednol (LOTEMAX) 0.5 % ophthalmic suspension Place 1 drop into the left eye 2 (two) times daily.     .  metoprolol (LOPRESSOR) 50 MG tablet TAKE 1 TABLET BY MOUTH 2 TIMES DAILY. 60 tablet 6  . mometasone-formoterol (DULERA) 200-5 MCG/ACT AERO Inhale 2 puffs into the lungs 2 (two) times daily. 1 Inhaler 0  . montelukast (SINGULAIR) 10 MG tablet Take 10 mg by mouth at bedtime.    . Olopatadine HCl (PATANASE) 0.6 % SOLN Place 1 puff into the nose daily.    . ONE TOUCH ULTRA TEST test strip 1 each by Other route as needed.     . potassium chloride (K-DUR) 10 MEQ tablet Take 1 tablet (10 mEq total) by mouth daily. 90 tablet 3  . pregabalin (LYRICA) 100 MG capsule Take 100 mg by mouth 2 (two) times daily.    . Travoprost (TRAVATAN OP) Place 1 drop into both eyes at bedtime.      No current facility-administered medications on file prior to visit.

## 2014-02-09 ENCOUNTER — Ambulatory Visit (INDEPENDENT_AMBULATORY_CARE_PROVIDER_SITE_OTHER)
Admission: RE | Admit: 2014-02-09 | Discharge: 2014-02-09 | Disposition: A | Discharge status: Home / Self Care | Payer: Medicare Other | Source: Ambulatory Visit | Attending: Internal Medicine | Admitting: Internal Medicine

## 2014-02-09 DIAGNOSIS — R05 Cough: Secondary | ICD-10-CM | POA: Diagnosis not present

## 2014-02-09 DIAGNOSIS — R0602 Shortness of breath: Secondary | ICD-10-CM | POA: Diagnosis not present

## 2014-02-09 DIAGNOSIS — J209 Acute bronchitis, unspecified: Secondary | ICD-10-CM

## 2014-04-13 DIAGNOSIS — E1165 Type 2 diabetes mellitus with hyperglycemia: Secondary | ICD-10-CM | POA: Diagnosis not present

## 2014-04-13 DIAGNOSIS — Z1389 Encounter for screening for other disorder: Secondary | ICD-10-CM | POA: Diagnosis not present

## 2014-04-13 DIAGNOSIS — R0982 Postnasal drip: Secondary | ICD-10-CM | POA: Diagnosis not present

## 2014-04-13 DIAGNOSIS — Z79899 Other long term (current) drug therapy: Secondary | ICD-10-CM | POA: Diagnosis not present

## 2014-04-13 DIAGNOSIS — D519 Vitamin B12 deficiency anemia, unspecified: Secondary | ICD-10-CM | POA: Diagnosis not present

## 2014-04-18 DIAGNOSIS — H4043X3 Glaucoma secondary to eye inflammation, bilateral, severe stage: Secondary | ICD-10-CM | POA: Diagnosis not present

## 2014-05-02 DIAGNOSIS — H4043X3 Glaucoma secondary to eye inflammation, bilateral, severe stage: Secondary | ICD-10-CM | POA: Diagnosis not present

## 2014-05-02 DIAGNOSIS — H43819 Vitreous degeneration, unspecified eye: Secondary | ICD-10-CM | POA: Diagnosis not present

## 2014-05-03 ENCOUNTER — Other Ambulatory Visit (INDEPENDENT_AMBULATORY_CARE_PROVIDER_SITE_OTHER): Payer: Medicare Other

## 2014-05-03 ENCOUNTER — Ambulatory Visit (INDEPENDENT_AMBULATORY_CARE_PROVIDER_SITE_OTHER): Payer: Medicare Other | Admitting: Internal Medicine

## 2014-05-03 ENCOUNTER — Encounter: Payer: Self-pay | Admitting: Internal Medicine

## 2014-05-03 VITALS — BP 132/76 | HR 69 | Ht 62.0 in | Wt 160.4 lb

## 2014-05-03 DIAGNOSIS — I10 Essential (primary) hypertension: Secondary | ICD-10-CM

## 2014-05-03 DIAGNOSIS — G4733 Obstructive sleep apnea (adult) (pediatric): Secondary | ICD-10-CM | POA: Diagnosis not present

## 2014-05-03 DIAGNOSIS — D869 Sarcoidosis, unspecified: Secondary | ICD-10-CM | POA: Diagnosis not present

## 2014-05-03 DIAGNOSIS — J42 Unspecified chronic bronchitis: Secondary | ICD-10-CM | POA: Diagnosis not present

## 2014-05-03 DIAGNOSIS — J0100 Acute maxillary sinusitis, unspecified: Secondary | ICD-10-CM | POA: Insufficient documentation

## 2014-05-03 DIAGNOSIS — R918 Other nonspecific abnormal finding of lung field: Secondary | ICD-10-CM | POA: Diagnosis not present

## 2014-05-03 LAB — CBC WITH DIFFERENTIAL/PLATELET
BASOS PCT: 0.2 % (ref 0.0–3.0)
Basophils Absolute: 0 10*3/uL (ref 0.0–0.1)
EOS ABS: 0.2 10*3/uL (ref 0.0–0.7)
Eosinophils Relative: 1.8 % (ref 0.0–5.0)
HEMATOCRIT: 37 % (ref 36.0–46.0)
Hemoglobin: 12.3 g/dL (ref 12.0–15.0)
LYMPHS ABS: 1.7 10*3/uL (ref 0.7–4.0)
Lymphocytes Relative: 17.6 % (ref 12.0–46.0)
MCHC: 33.2 g/dL (ref 30.0–36.0)
MCV: 82.7 fl (ref 78.0–100.0)
Monocytes Absolute: 0.7 10*3/uL (ref 0.1–1.0)
Monocytes Relative: 6.6 % (ref 3.0–12.0)
Neutro Abs: 7.2 10*3/uL (ref 1.4–7.7)
Neutrophils Relative %: 73.8 % (ref 43.0–77.0)
Platelets: 260 10*3/uL (ref 150.0–400.0)
RBC: 4.47 Mil/uL (ref 3.87–5.11)
RDW: 15.3 % (ref 11.5–15.5)
WBC: 9.8 10*3/uL (ref 4.0–10.5)

## 2014-05-03 MED ORDER — PHENYLEPHRINE HCL 1 % NA SOLN
3.0000 [drp] | Freq: Once | NASAL | Status: AC
  Administered 2014-05-03: 3 [drp] via NASAL

## 2014-05-03 MED ORDER — AMOXICILLIN-POT CLAVULANATE 875-125 MG PO TABS: 1.0000 | ORAL_TABLET | Freq: Two times a day (BID) | ORAL | Status: DC

## 2014-05-03 MED ORDER — METHYLPREDNISOLONE ACETATE 80 MG/ML IJ SUSP
80.0000 mg | Freq: Once | INTRAMUSCULAR | Status: AC
  Administered 2014-05-03: 80 mg via INTRAMUSCULAR

## 2014-05-03 NOTE — Assessment & Plan Note (Signed)
Needs encouragement and education to use CPAP effectively. We will try to help her cough, which she says is the main impediment.

## 2014-05-03 NOTE — Assessment & Plan Note (Signed)
History of sarcoid, plan-ACE level

## 2014-05-03 NOTE — Assessment & Plan Note (Signed)
Acute bronchitis exacerbation Plan-CBC with differential, Depo-Medrol

## 2014-05-03 NOTE — Assessment & Plan Note (Signed)
Plan-nasal decongestant inhalation treatment, Depo-Medrol, Augmentin

## 2014-05-03 NOTE — Assessment & Plan Note (Signed)
I doubt her current cough or flex sarcoid activity but we will check ACE level

## 2014-05-03 NOTE — Patient Instructions (Addendum)
Script for augmentin sent  Neb neo nasal  Depo 80  Order- lab- CBC w diff, ACE level  Dx sarcoid  Our goal will be to use the CPAP all night, every night. At a minimum we need to try for 4 hours per night, at least 70% of nights.  If we can't tell for sure whether you have a sinus infection, we may want to talk later about getting a  CT scan of your sinuses.

## 2014-05-03 NOTE — Progress Notes (Signed)
02/05/11- 82 yoF former smoker seen on referral from Dr Estanislado Pandy for concern of sarcoid. Sarcoid was diagnosed by a series of ophthalmologists at [facility] and elsewhere, 20 years ago. 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 Shortness of breath has been chronic with one flight of stairs. She thinks she may be gradually getting worse. There is chronic dry cough which is been worse since a flulike illness 3 weeks ago. 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 I can't tell a specific arthritis diagnosis has been made. There is no history of liver or kidney disease. Bronchitis was diagnosed at the time of allergy skin testing a year ago. Food sensitivity to shrimp and crab which make her lips itch. Positive skin test also for grass and house dust. She has not been on allergy vaccine. A rescue inhaler has helped at times. 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 Advair was no help and Symbicort may have helped only a little. ACE 46 (8-52) 09/30/10 Medication choices been limited by glaucoma. No history of TB exposure. Past history of sinus infections. Active problem with heartburn which is partly suppressed with Nexium once or twice daily/ Dr Collene Mares. Married, now on short-term disability. Quit smoking 1980 Several family members on her father's side had sarcoid.  03/10/11- 77 yoF former smoker followed for bronchitis, hx sarcoid, complicated by GERD, glaucoma Dulera inhaler did not have significant improvement. She is off of methotrexate and prednisone as of this month. 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 She occasionally wakes coughing with thick white mucus, stress incontinence. Just occasionally tight. DOE with one flight of stairs and has to stop at the top to get her breath but  no longer has to catch her breath halfway down stairs carrying laundry. Tussionex no longer works as well for cough as it used to.  07/08/11- 65 yoF former smoker followed for bronchitis, hx sarcoid, complicated by GERD, glaucoma Iincreased cough and congestion(nasal and chest)Productive-green/yellow in color x 3 days. Denies any fever but has been having chills. SOB and wheezing along with cough. SOB is all the time. Need to check if okay to start back on Methtrexate again. Dr Estanislado Pandy has been following for sarcoid of the eye and wants to resume methotrexate. There has been increased cough, productive of  mucus, worse at night and supine. Maybe some chills. Stress incontinence. Arthralgias. Postnasal drip. GERD is fairly well controlled with Nexium. Taking tramadol now for pain so is not helping the cough. Failed benzonatate. This was a prolonged visit 40 minutes. CXR 02/09/11-images reviewed with her. IMPRESSION:  Stable examination. No active cardiopulmonary process.  Original Report Authenticated By: Vivia Ewing, M.D.    10/09/11- 52 yoF former smoker followed for bronchitis, hx sarcoid, complicated by GERD, glaucoma Cough-worse at night-yellow in color; drainage in throat; SOB and wheezing on bad days Active sarcoid uveitis- Dr Brewington/ Opth, Deveshwar/ Rheum, [facility].  Will see Dr.Deveshwar next month for decision about restarting methotrexate. Persistent pain mid anterior chest through to back, worse with prolonged standing and kitchen, eased by leaning over. No reflux or dysphagia. Expects seasonal bronchitis but denies routine cough or dyspnea, fever or sweats. CXR 07/10/11- negative. ACE 07/08/11- 40/ WNL.  05/10/12- 41 yoF former smoker followed for bronchitis, hx sarcoid, complicated by GERD, glaucoma Cough-worse at night-yellow in color; drainage in  throat; SOB and wheezing on bad days Active sarcoid uveitis- Dr Brewington/ Opth, Deveshwar/ Rheum, [facility].   FOLLOWS FOR: still having problems with SOB with activity. No change in complaints of pain, mid anterior chest, left greater than right mostly around into her back and with prolonged standing she has to stop kitchen work to sit down. We discussed this as more of a back problem and a chest problem. She has seen Dr.Van Creed Copper w/ [facility] . Dr. Estanislado Pandy is treating with ibuprofen.  07/08/12- 4 yoF former smoker followed for bronchitis, hx sarcoid, complicated by GERD, glaucoma FOLLOWS FOR: went for ROV at [facility]-was told allergies were flared up and Deveshwar MD stated Sarcoid was flared up as well. Was given Rx's(currently on Prednisone taper) continues to cough-green and yellow with streaks of blood at times. (Dr Creed Copper) Complains of backache. Rhinitis is responded to nasal spray, Singulair, Allegra. Dr.Deveshwar feels for sarcoid might be flaring. She prescribed prednisone, methotrexate, folic acid. ACE level 07/08/11- 40 (8-52) CT chest 05/19/12 IMPRESSION:  . No explanation for the patient's history of back pain.  2. Despite the reported clinical history of sarcoidosis, there are  no imaging findings to suggest a diagnosis of sarcoidosis at this  time in this patient. Clinical correlation is recommended.  3. Mild centrilobular emphysema.  4. Atherosclerosis, including left anterior descending coronary  artery disease. Please note that although the presence of coronary  artery calcium documents the presence of coronary artery disease,  the severity of this disease and any potential stenosis cannot be  assessed on this non-gated CT examination. Assessment for  potential risk factor modification, dietary therapy or  pharmacologic therapy may be warranted, if clinically indicated.  5. Small pulmonary nodules in the lungs, as above. The largest of  these is a 6 mm ground-glass attenuation nodule in the right upper  lobe (image 16 of series 3). Initial follow-up by  chest CT without  contrast is recommended in 3 months to confirm persistence. This  recommendation follows the consensus statement: Recommendations for  the Management of Subsolid Pulmonary Nodules Detected at CT: A  Statement from the [place] as published in Radiology  2013; 266:304-317.  Original Report Authenticated By: Vinnie Langton, M.D.  08/19/12- 38 yoF former smoker followed for bronchitis, hx sarcoid, complicated by GERD, glaucoma follows for:  discuss ct results  Occasional sweats, minor rash at right ankle, cough productive white, thick and occasionally yellow sputum. Some dyspnea with exertion, no chest pain or palpitation. ACE 07/08/12- 27 (8-52) CT chest 08/18/12 IMPRESSION:  1. New clustered peribronchovascular ill-defined nodularity in the  right upper lobe, indicative of an infectious bronchiolitis.  2. Previously measured 6 mm right upper lobe nodule is less  discrete on the current examination.  3. Coronary artery calcification.  Original Report Authenticated By: Lorin Picket, M.D.  02/25/13- 7 yoF former smoker followed for bronchitis/ bronchiolitis/ nodules, hx  Ocular sarcoid, complicated by GERD, glaucoma FOLLOWS FOR: review PFT, 6MW, CT Scan, and sputum cultures with patient. Pt states that she continues to have SOB with activitiy; when not active-she feels like she cant breathe Occasional cough, scant clear sputum, better with Dulera. Pains around chest, chronic, if she forces a deep breath. PFT 09/21/2012-mild obstructive airways disease with response to bronchodilator, moderate restriction, diffusion severely reduced. FVC 1.88/82%, FEV11.58/88%, FEV1/FVC 0.84, FEF 25-75% 1.86/106% with some response to bronchodilator in small airways. TLC 62%, DLCO 49%. 6MWT 09/21/12- WNL 97%, 97%, 98%, 381 m. Sputum culture-normal  flora, negative for AFB and fungus  08/25/13- 68 yoF former smoker followed for bronchitis/ bronchiolitis/ nodules, hx  Ocular sarcoid,  complicated by GERD, glaucoma FOLLOWS FOR: not using routine inhalers as she should; pt has good and bad days with breathing. Pt states her husabnd has noticed she has started snoring and gasping for air-has to be rolled over or get up and catch her breath. She was also told she stops breathing in her sleep and has noticed this during the day as well. CXR 6/ 4 /15 IMPRESSION:  No active cardiopulmonary disease.  Electronically Signed  By: Dereck Ligas M.D.  On: 07/14/2013 11:45  11/01/13-68 yoF former smoker followed for bronchitis/ bronchiolitis/ nodules, hx  Ocular sarcoid, OSA complicated by GERD, glaucoma FOLLOWS FOR: Pt c/o increased SOB with exertion, prod cough with clear to yellow mucus.  Review sleep study.  NPSG-10/18/13- Mild OSA, AHI 9.3/ hr, weight 164 lbs  To start CPAP auto  01/02/14- 68 yoF former smoker followed for bronchitis/ bronchiolitis/ nodules, hx  Ocular sarcoid, OSA complicated by GERD, glaucoma FOLLOWS FOR: Pt complains of increased coughing with production of white mucus. Has been using CPAP every night for about 7-9 hours. Mask is causing nose to become dry and causing sores in her nose. Talked with [facility] about this. Is not sleeping all night like she would like to. Fullface mask, auto 5-15/[facility] We reviewed chest x-ray showing bone island in the left rib-it was not a lung nodule after all  ROS-see HPI Constitutional:   No-   weight loss, +night sweats, fevers, chills, fatigue, lassitude. HEENT:   No-  headaches, difficulty swallowing, tooth/dental problems, sore throat,       Some  sneezing, itching, ear ache, +nasal congestion, post nasal drip,  CV:  +chest pain, no-orthopnea, PND, swelling in lower extremities, anasarca, dizziness, palpitations Resp: +  shortness of breath with exertion not at rest.              No-   productive cough,  + non-productive cough,  No- coughing up of blood.                No-change in color of mucus.  No- wheezing.   Skin: No-    rash or lesions. GI:  No-   heartburn, indigestion, abdominal pain, nausea, vomiting,  GU:  MS:  +  joint pain or swelling, + back pain Neuro-     nothing unusual Psych:  No- change in mood or affect. No depression or anxiety.  No memory loss.  OBJ General- Alert, Oriented, Affect anxious, Distress- none acute Skin- rash-none, lesions- none, excoriation- none Lymphadenopathy- none Head- atraumatic            Eyes- Gross vision intact, PERRLA, conjunctivae clear secretions- not injected            Ears- Hearing aid Left            Nose- + small scab right external nose, no-Septal dev, mucus, polyps, erosion, perforation             Throat- Mallampati II , mucosa clear , drainage- none, tonsils- atrophic Neck- flexible , trachea midline, no stridor , thyroid nl, carotid no bruit Chest - symmetrical excursion , unlabored           Heart/CV- RRR , no murmur , no gallop  , no rub, nl s1 s2                           -  JVD- none , edema- none, stasis changes- none, varices- none           Lung- clear,no-cough, no wheeze , dullness-none, rub- none           Chest wall-  Abd-  Br/ Gen/ Rectal- Not done, not indicated Extrem- cyanosis- none, clubbing, none, atrophy- none, strength- nl. +Boot L foot stress fx Neuro- grossly intact to observation    02/05/11- 65 yoF former smoker seen on referral from Dr Estanislado Pandy for concern of sarcoid. Sarcoid was diagnosed by a series of ophthalmologists at [facility] and elsewhere, 20 years ago. 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 Shortness of breath has been chronic with one flight of stairs. She thinks she may be gradually getting worse. There is chronic dry cough which is been worse since a flulike illness 3 weeks ago. 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 I can't tell a specific arthritis diagnosis has been made. There is no history of liver or kidney disease. Bronchitis was  diagnosed at the time of allergy skin testing a year ago. Food sensitivity to shrimp and crab which make her lips itch. Positive skin test also for grass and house dust. She has not been on allergy vaccine. A rescue inhaler has helped at times. 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 Advair was no help and Symbicort may have helped only a little. ACE 46 (8-52) 09/30/10 Medication choices been limited by glaucoma. No history of TB exposure. Past history of sinus infections. Active problem with heartburn which is partly suppressed with Nexium once or twice daily/ Dr Collene Mares. Married, now on short-term disability. Quit smoking 1980 Several family members on her father's side had sarcoid.  03/10/11- 19 yoF former smoker followed for bronchitis, hx sarcoid, complicated by GERD, glaucoma Dulera inhaler did not have significant improvement. She is off of methotrexate and prednisone as of this month. 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 She occasionally wakes coughing with thick white mucus, stress incontinence. Just occasionally tight. DOE with one flight of stairs and has to stop at the top to get her breath but no longer has to catch her breath halfway down stairs carrying laundry. Tussionex no longer works as well for cough as it used to.  07/08/11- 65 yoF former smoker followed for bronchitis, hx sarcoid, complicated by GERD, glaucoma Iincreased cough and congestion(nasal and chest)Productive-green/yellow in color x 3 days. Denies any fever but has been having chills. SOB and wheezing along with cough. SOB is all the time. Need to check if okay to start back on Methtrexate again. Dr Estanislado Pandy has been following for sarcoid of the eye and wants to resume methotrexate. There has been increased cough, productive of  mucus, worse at night and supine. Maybe  some chills. Stress incontinence. Arthralgias. Postnasal drip. GERD is fairly well controlled with Nexium. Taking tramadol now for pain so is not helping the cough. Failed benzonatate. This was a prolonged visit 40 minutes. CXR 02/09/11-images reviewed with her. IMPRESSION:  Stable examination. No active cardiopulmonary process.  Original Report Authenticated By: Vivia Ewing, M.D.    10/09/11- 36 yoF former smoker followed for bronchitis, hx sarcoid, complicated by GERD, glaucoma Cough-worse at night-yellow in color; drainage in throat; SOB and wheezing on bad days Active sarcoid uveitis- Dr Brewington/ Opth, Deveshwar/ Rheum, [facility].  Will see Dr.Deveshwar next month for decision about restarting methotrexate. Persistent pain mid anterior chest through to back, worse with prolonged standing and kitchen, eased by leaning over. No reflux or dysphagia. Expects seasonal bronchitis but denies routine cough or dyspnea, fever or sweats. CXR 07/10/11- negative. ACE 07/08/11- 40/ WNL.  05/10/12- 66 yoF former smoker followed for bronchitis, hx sarcoid, complicated by GERD, glaucoma Cough-worse at night-yellow in color; drainage in throat; SOB and wheezing on bad days Active sarcoid uveitis- Dr Brewington/ Opth, Deveshwar/ Rheum, [facility].  FOLLOWS FOR: still having problems with SOB with activity. No change in complaints of pain, mid anterior chest, left greater than right mostly around into her back and with prolonged standing she has to stop kitchen work to sit down. We discussed this as more of a back problem and a chest problem. She has seen Dr.Van Creed Copper w/ [facility] . Dr. Estanislado Pandy is treating with ibuprofen.  07/08/12- 65 yoF former smoker followed for bronchitis, hx sarcoid, complicated by GERD, glaucoma FOLLOWS FOR: went for ROV at [facility]-was told allergies were flared up and Deveshwar MD stated Sarcoid was flared up as well. Was given Rx's(currently  on Prednisone taper) continues to cough-green and yellow with streaks of blood at times. (Dr Creed Copper) Complains of backache. Rhinitis is responded to nasal spray, Singulair, Allegra. Dr.Deveshwar feels for sarcoid might be flaring. She prescribed prednisone, methotrexate, folic acid. ACE level 07/08/11- 40 (8-52) CT chest 05/19/12 IMPRESSION:  . No explanation for the patient's history of back pain.  2. Despite the reported clinical history of sarcoidosis, there are  no imaging findings to suggest a diagnosis of sarcoidosis at this  time in this patient. Clinical correlation is recommended.  3. Mild centrilobular emphysema.  4. Atherosclerosis, including left anterior descending coronary  artery disease. Please note that although the presence of coronary  artery calcium documents the presence of coronary artery disease,  the severity of this disease and any potential stenosis cannot be  assessed on this non-gated CT examination. Assessment for  potential risk factor modification, dietary therapy or  pharmacologic therapy may be warranted, if clinically indicated.  5. Small pulmonary nodules in the lungs, as above. The largest of  these is a 6 mm ground-glass attenuation nodule in the right upper  lobe (image 16 of series 3). Initial follow-up by chest CT without  contrast is recommended in 3 months to confirm persistence. This  recommendation follows the consensus statement: Recommendations for  the Management of Subsolid Pulmonary Nodules Detected at CT: A  Statement from the [place] as published in Radiology  2013; 266:304-317.  Original Report Authenticated By: Vinnie Langton, M.D.  08/19/12- 31 yoF former smoker followed for bronchitis, hx sarcoid, complicated by GERD, glaucoma follows for:  discuss ct results  Occasional sweats, minor rash at right ankle, cough productive white, thick and occasionally yellow sputum. Some dyspnea with exertion, no chest pain or  palpitation. ACE 07/08/12- 27 (8-52) CT chest 08/18/12 IMPRESSION:  1. New clustered peribronchovascular ill-defined nodularity in the  right upper lobe, indicative of an infectious bronchiolitis.  2. Previously measured 6 mm right upper lobe nodule is less  discrete on the current examination.  3. Coronary artery calcification.  Original Report Authenticated By: Lorin Picket, M.D.  02/25/13- 43 yoF former smoker followed for bronchitis/ bronchiolitis/ nodules, hx  Ocular sarcoid, complicated by GERD, glaucoma FOLLOWS FOR: review PFT, 6MW, CT Scan, and sputum cultures with patient. Pt states that she continues to have  SOB with activitiy; when not active-she feels like she cant breathe Occasional cough, scant clear sputum, better with Dulera. Pains around chest, chronic, if she forces a deep breath. PFT 09/21/2012-mild obstructive airways disease with response to bronchodilator, moderate restriction, diffusion severely reduced. FVC 1.88/82%, FEV11.58/88%, FEV1/FVC 0.84, FEF 25-75% 1.86/106% with some response to bronchodilator in small airways. TLC 62%, DLCO 49%. 6MWT 09/21/12- WNL 97%, 97%, 98%, 381 m. Sputum culture-normal flora, negative for AFB and fungus  08/25/13- 68 yoF former smoker followed for bronchitis/ bronchiolitis/ nodules, hx  Ocular sarcoid, complicated by GERD, glaucoma FOLLOWS FOR: not using routine inhalers as she should; pt has good and bad days with breathing. Pt states her husabnd has noticed she has started snoring and gasping for air-has to be rolled over or get up and catch her breath. She was also told she stops breathing in her sleep and has noticed this during the day as well. CXR 6/ 4 /15 IMPRESSION:  No active cardiopulmonary disease.  Electronically Signed  By: Dereck Ligas M.D.  On: 07/14/2013 11:45  11/01/13-68 yoF former smoker followed for bronchitis/ bronchiolitis/ nodules, hx  Ocular sarcoid, OSA complicated by GERD, glaucoma FOLLOWS FOR: Pt c/o increased  SOB with exertion, prod cough with clear to yellow mucus.  Review sleep study.  NPSG-10/18/13- Mild OSA, AHI 9.3/ hr, weight 164 lbs  To start CPAP auto  05/03/14- 68 yoF former smoker followed for bronchitis/ bronchiolitis/ nodules, hx  Ocular sarcoid, OSA complicated by GERD, glaucoma FOLLOWS FOR: Pt reports using CPAP x 4 nights a week when increasd cough is not present before going to sleep. Pt reports being sick in December and since she has been having increased cough with excessive mucus production -- prevents pt from wearing CPAP nightly and for longer than 4 hours per night consensus that she had a viral pattern bronchitis just before Christmas, treated with Tamiflu. With persistent cough, she saw Dr VanWinkle/ allergist who diagnosed "sinuses". She then called [place] because of bothersome cough when lying down which interferes with CPAP use has blown a little blood from her nose. May be very minimal wheeze. Cough productive thick yellow to clear sputum. Right frontoparietal headache over the last week. CXR 02/09/14- IMPRESSION: No active cardiopulmonary disease. Electronically Signed  By: Ivar Drape M.D.  On: 02/09/2014 09:30  ROS-see HPI Constitutional:   No-   weight loss, +night sweats, fevers, chills, fatigue, lassitude. HEENT:   +  headaches, difficulty swallowing, tooth/dental problems, sore throat,       Some  sneezing, itching, ear ache, +nasal congestion, post nasal drip,  CV:  +chest pain, no-orthopnea, PND, swelling in lower extremities, anasarca, dizziness, palpitations Resp: +  shortness of breath with exertion not at rest.              +   productive cough,  + non-productive cough,  No- coughing up of blood.                No-change in color of mucus.  + wheezing.   Skin: No-   rash or lesions. GI:  No-   heartburn, indigestion, abdominal pain, nausea, vomiting,  GU:  MS:  +  joint pain or swelling, + back pain Neuro-     nothing unusual Psych:  No- change in mood or  affect. No depression or anxiety.  No memory loss.  OBJ General- Alert, Oriented, Affect anxious, Distress- none acute Skin- rash-none, lesions- none, excoriation- none Lymphadenopathy- none Head- atraumatic  Eyes- Gross vision intact, PERRLA, conjunctivae clear secretions- not injected            Ears- Hearing aid Left            Nose- + turbinate edema, no-Septal dev, mucus, polyps, erosion, perforation             Throat- Mallampati II , mucosa clear , drainage- none, tonsils- atrophic Neck- flexible , trachea midline, no stridor , thyroid nl, carotid no bruit Chest - symmetrical excursion , unlabored           Heart/CV- RRR , no murmur , no gallop  , no rub, nl s1 s2                           - JVD- none , edema- none, stasis changes- none, varices- none           Lung- clear, cough + wheezy , dullness-none, rub- none           Chest wall-  Abd-  Br/ Gen/ Rectal- Not done, not indicated Extrem- cyanosis- none, clubbing, none, atrophy- none, strength- nl.  Neuro- grossly intact to observation

## 2014-05-04 LAB — ANGIOTENSIN CONVERTING ENZYME: Angiotensin-Converting Enzyme: 49 U/L (ref 8–52)

## 2014-05-09 NOTE — Progress Notes (Signed)
Quick Note:  Called and spoke to pt. Informed pt of the results and recs per CY. Pt verbalized understanding and denied any further questions or concerns at this time. ______ 

## 2014-05-24 ENCOUNTER — Encounter: Payer: Self-pay | Admitting: Cardiovascular Disease

## 2014-05-24 ENCOUNTER — Ambulatory Visit (INDEPENDENT_AMBULATORY_CARE_PROVIDER_SITE_OTHER): Payer: Medicare Other | Admitting: Cardiovascular Disease

## 2014-05-24 VITALS — BP 110/70 | HR 81 | Ht 62.0 in | Wt 157.8 lb

## 2014-05-24 DIAGNOSIS — I10 Essential (primary) hypertension: Secondary | ICD-10-CM

## 2014-05-24 DIAGNOSIS — E785 Hyperlipidemia, unspecified: Secondary | ICD-10-CM | POA: Diagnosis not present

## 2014-05-24 NOTE — Progress Notes (Signed)
Cardiology Office Note   Date:  05/24/2014   ID:  Sheila Reynolds, DOB 01-19-1946, MRN [id number]  PCP:  Maximino Greenland, MD  Cardiologist:   Thayer Headings, MD   Chief Complaint  Patient presents with  . Follow-up   1. Hyperlipidemia 2. Chest pain 3. Nonsustained ventricular tachycardia 4. Mild to moderate coronary artery disease by cath in 2009. 5. 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 Keturah Barre  History of Present Illness:  Sheila Reynolds is a 69 y.o. female with the above noted hx. She has continued to have some palpitations. She has occasional episodes of lightheadedness when she has palpitations. Her main issue has been lots of total body cramps. She has been seen in the emergency room. Her lab work looked fine.  She's had lots of dyspnea especially with exertion. She has a history of sarcoidosis and is followed by Dr. Annamaria Boots.  March 11, 2012: Khilee has had more palpitations recently. It Appears that her metoprolol dose was decreased slightly. We placed an event monitor on her. She did not have any episodes of atrial fibrillation. She has occasional drinker atrial contractions and occasional premature ventricular contractions. There were no life-threatening arrhythmias.  She is doing well from a cardiac standpoint. She's not had any episodes of syncope. She has significant shortness of breath especially when climbing stairs. This is likely due to her sarcoidosis. She denies any chest pain. Her BP is a bit elevated. She avoids salt.   May 02, 2013 :  She presented to see Richardson Dopp in February with some increased palpitations and lightheadedness. He recommended that she decrease her caffeine intake. He placed a 21 day event monitor and instructed her to take an extra metoprolol as needed.  The palpitations can occur at any time. They occur with rest and with exertion. They're not constant. The last 30-45 seconds and occur multiple times through the day.  Sept.  16, 2015:  Still having palpitations. She has had more leg edema and her medical doctor on Lasix which he now takes about every other day. She was not on potassium replacement. This may be the cause of her increased PVCs.   May 24, 2014:  Sheila Reynolds is a 69 y.o. female who presents for follow up of her palpitations   Still has these palpitations.  No syncope.   Past Medical History  Diagnosis Date  . Hyperlipidemia   . Chest pain   . Nonsustained ventricular tachycardia   . Fibromyalgia   . Coronary artery disease     Mild to moderate CAD  . Palpitations   . Hx of echocardiogram     Echo (03/2013):  Tech limited; Mild focal basal septal hypertrophy, EF 60-65%, normal RVF  . Family history of anesthesia complication     daughter N/V  . Heart murmur   . Dysrhythmia     has fluttering at times  . Shortness of breath     loses breathe at times and have to take extra breathes  . Diabetes mellitus without complication   . Hypertension   . Bronchitis with emphysema   . Anxiety     on meds  . GERD (gastroesophageal reflux disease)     on meds  . Headache(784.0)   . Anemia     3 months ago anemic    Past Surgical History  Procedure Laterality Date  . Cardiac catheterization  05/04/2007    reveals overall normal left ventricular systolic function. Ejection fraction 65-70%  .  Total abdominal hysterectomy  1982  . Breast surgery  approx 1992    bilateral breast surgery for fibrocystic breast disease  . Tonsillectomy    . Colonoscopy w/ biopsies and polypectomy    . Cataract extraction w/phaco Right 07/20/2013    Procedure: CATARACT EXTRACTION PHACO AND INTRAOCULAR LENS PLACEMENT (IOC);  Surgeon: Marylynn Pearson, MD;  Location: [facility];  Service: Ophthalmology;  Laterality: Right;  . Mini shunt insertion Right 07/20/2013    Procedure: INSERTION OF GLAUCOMA FILTRATION DEVICE RIGHT EYE;  Surgeon: Marylynn Pearson, MD;  Location: [facility];  Service: Ophthalmology;  Laterality: Right;    . Mitomycin c application Right 07/17/43    Procedure: MITOMYCIN C APPLICATION;  Surgeon: Marylynn Pearson, MD;  Location: [facility];  Service: Ophthalmology;  Laterality: Right;     Current Outpatient Prescriptions  Medication Sig Dispense Refill  . Albuterol Sulfate (VENTOLIN HFA IN) Inhale 2 puffs into the lungs 4 (four) times daily as needed (shortness of breath).     . ALPRAZolam (XANAX) 0.5 MG tablet Take 0.25 mg by mouth at bedtime as needed for anxiety or sleep. For sleep    . aspirin 81 MG tablet Take 81 mg by mouth daily.      [name] atorvastatin (LIPITOR) 20 MG tablet TAKE 1 TABLET BY MOUTH EVERY DAY 30 tablet 6  . brimonidine (ALPHAGAN P) 0.1 % SOLN Place 1 drop into both eyes every 8 (eight) hours.    . brinzolamide (AZOPT) 1 % ophthalmic suspension Place 1 drop into both eyes 3 (three) times daily.     [name] esomeprazole (NEXIUM) 40 MG capsule Take 40 mg by mouth 2 (two) times daily as needed (reflux).     . furosemide (LASIX) 20 MG tablet Take 1 tablet (20 mg total) by mouth daily. (Patient taking differently: 1 tablet three times a week) 30 tablet 11  . JANUMET 50-500 MG per tablet Take 1 tablet by mouth Twice daily.    [name] loteprednol (LOTEMAX) 0.5 % ophthalmic suspension Place 1 drop into the left eye 2 (two) times daily.     . metoprolol (LOPRESSOR) 50 MG tablet TAKE 1 TABLET BY MOUTH 2 TIMES DAILY. 60 tablet 6  . montelukast (SINGULAIR) 10 MG tablet Take 10 mg by mouth at bedtime.    . ONE TOUCH ULTRA TEST test strip 1 each by Other route as needed.     . potassium chloride (K-DUR) 10 MEQ tablet Take 1 tablet (10 mEq total) by mouth daily. (Patient taking differently: Take 10 mEq by mouth every other day. ) 90 tablet 3  . pregabalin (LYRICA) 100 MG capsule Take 100 mg by mouth 2 (two) times daily.    . Travoprost (TRAVATAN OP) Place 1 drop into both eyes at bedtime.     . hydrochlorothiazide (HYDRODIURIL) 25 MG tablet Take 25 mg by mouth 3 (three) times a week.     . mometasone-formoterol  (DULERA) 200-5 MCG/ACT AERO Inhale 2 puffs into the lungs 2 (two) times daily. (Patient not taking: Reported on 05/24/2014) 1 Inhaler 0   No current facility-administered medications for this visit.    Allergies:   Crestor; Demerol; and Shellfish allergy    Social History:  The patient  reports that she quit smoking about 34 years ago. Her smoking use included Cigarettes. She has a 4 pack-year smoking history. She has never used smokeless tobacco. She reports that she does not drink alcohol or use illicit drugs.   Family History:  The patient's family history includes  Cancer in her mother and paternal grandmother; Diabetes in her brother and father; Glaucoma in her brother and father; Heart disease in her father and paternal grandmother.    ROS:  Please see the history of present illness.    Review of Systems: Constitutional:  denies fever, chills, diaphoresis, appetite change and fatigue.  HEENT: denies photophobia, eye pain, redness, hearing loss, ear pain, congestion, sore throat, rhinorrhea, sneezing, neck pain, neck stiffness and tinnitus.  Respiratory: denies SOB, DOE, cough, chest tightness, and wheezing.  Cardiovascular: denies chest pain, palpitations and leg swelling.  Gastrointestinal: denies nausea, vomiting, abdominal pain, diarrhea, constipation, blood in stool.  Genitourinary: denies dysuria, urgency, frequency, hematuria, flank pain and difficulty urinating.  Musculoskeletal: denies  myalgias, back pain, joint swelling, arthralgias and gait problem.   Skin: denies pallor, rash and wound.  Neurological: denies dizziness, seizures, syncope, weakness, light-headedness, numbness and headaches.   Hematological: denies adenopathy, easy bruising, personal or family bleeding history.  Psychiatric/ Behavioral: denies suicidal ideation, mood changes, confusion, nervousness, sleep disturbance and agitation.       All other systems are reviewed and negative.    PHYSICAL  EXAM: VS:  BP 110/70 mmHg  Pulse 81  Ht 5\' 2"  (1.575 m)  Wt 157 lb 12.8 oz (71.578 kg)  BMI 28.85 kg/m2 , BMI Body mass index is 28.85 kg/(m^2). GEN: Well nourished, well developed, in no acute distress HEENT: normal Neck: no JVD, carotid bruits, or masses Cardiac: RRR; no murmurs, rubs, or gallops,no edema  Respiratory:  clear to auscultation bilaterally, normal work of breathing GI: soft, nontender, nondistended, + BS MS: no deformity or atrophy Skin: warm and dry, no rash Neuro:  Strength and sensation are intact Psych: normal   EKG:  EKG is ordered today. The ekg ordered today demonstrates NSR at 81.  Normal ECG    Recent Labs: 07/14/2013: BUN 13; Creatinine 0.69; Potassium 4.3; Sodium 141 05/03/2014: Hemoglobin 12.3; Platelets 260.0    Lipid Panel    Component Value Date/Time   CHOL 112 09/01/2012 1229   TRIG 83.0 09/01/2012 1229   HDL 37.70* 09/01/2012 1229   CHOLHDL 3 09/01/2012 1229   VLDL 16.6 09/01/2012 1229   LDLCALC 58 09/01/2012 1229      Wt Readings from Last 3 Encounters:  05/24/14 157 lb 12.8 oz (71.578 kg)  05/03/14 160 lb 6.4 oz (72.757 kg)  01/02/14 166 lb (75.297 kg)      Other studies Reviewed: Additional studies/ records that were reviewed today include: . Review of the above records demonstrates:    ASSESSMENT AND PLAN:  1. Hyperlipidemia - continue current medications. We'll check her lipids today. 2. Chest pain - she's having some chest cramps. I do not think that these are cardiac. 3. Nonsustained ventricular tachycardia 4. Mild to moderate coronary artery disease by cath in 2009. 5. 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 Keturah Barre   Current medicines are reviewed at length with the patient today.  The patient does not have concerns regarding medicines.  The following changes have been made:  no change  Labs/ tests ordered today include:  No orders of the defined types were placed in this encounter.     Disposition:   FU with me in 1  year.     Signed, Georgios Kina, Wonda Cheng, MD  05/24/2014 4:26 PM    [facility] [place], [place], [place]  [address] Phone: [phone]; Fax: [phone]

## 2014-05-24 NOTE — Patient Instructions (Signed)
Medication Instructions:  Your physician recommends that you continue on your current medications as directed. Please refer to the Current Medication list given to you today.   Labwork: Your physician recommends that you have lab work today:  Cholesterol, liver, basic metabolic panel   Testing/Procedures: None  Follow-Up: Your physician wants you to follow-up in: 1 year with Dr. Acie Fredrickson.  You will receive a reminder letter in the mail two months in advance. If you don't receive a letter, please call our office to schedule the follow-up appointment.

## 2014-05-25 LAB — BASIC METABOLIC PANEL
BUN: 19 mg/dL (ref 6–23)
CALCIUM: 10 mg/dL (ref 8.4–10.5)
CO2: 30 mEq/L (ref 19–32)
Chloride: 99 mEq/L (ref 96–112)
Creatinine, Ser: 0.79 mg/dL (ref 0.40–1.20)
GFR: 93.39 mL/min (ref >60.00)
Glucose, Bld: 133 mg/dL — ABNORMAL HIGH (ref 70–99)
Potassium: 4.6 mEq/L (ref 3.5–5.1)
Sodium: 135 mEq/L (ref 135–145)

## 2014-05-25 LAB — LIPID PANEL
CHOLESTEROL: 157 mg/dL (ref 0–200)
HDL: 51.7 mg/dL (ref >39.00)
LDL CALC: 85 mg/dL (ref 0–99)
NonHDL: 105.3
TRIGLYCERIDES: 104 mg/dL (ref 0.0–149.0)
Total CHOL/HDL Ratio: 3
VLDL: 20.8 mg/dL (ref 0.0–40.0)

## 2014-05-25 LAB — HEPATIC FUNCTION PANEL
ALT: 17 U/L (ref 0–35)
AST: 22 U/L (ref 0–37)
Albumin: 4.4 g/dL (ref 3.5–5.2)
Alkaline Phosphatase: 105 U/L (ref 39–117)
BILIRUBIN DIRECT: 0.1 mg/dL (ref 0.0–0.3)
Total Bilirubin: 0.4 mg/dL (ref 0.2–1.2)
Total Protein: 7.6 g/dL (ref 6.0–8.3)

## 2014-06-07 ENCOUNTER — Telehealth: Payer: Self-pay | Admitting: Internal Medicine

## 2014-06-07 NOTE — Telephone Encounter (Signed)
Will forward to Katie to look out for form. Please advise thanks

## 2014-06-08 NOTE — Telephone Encounter (Signed)
Form and message placed on CY's cart.

## 2014-06-08 NOTE — Telephone Encounter (Signed)
Called made pt aware of below. Pt form was placed upfront for her to pick up. Nothing further needed

## 2014-06-08 NOTE — Telephone Encounter (Signed)
The lung function tests and oxygen measurement while walking that we have on file are not severe enough to meet the [facility] requirements for handicapped parking. Maybe her primary or other doctor can help her with this.

## 2014-06-08 NOTE — Telephone Encounter (Signed)
[phone], pt cb

## 2014-06-08 NOTE — Telephone Encounter (Signed)
lmomtcb x1 

## 2014-06-20 DIAGNOSIS — K219 Gastro-esophageal reflux disease without esophagitis: Secondary | ICD-10-CM | POA: Diagnosis not present

## 2014-06-20 DIAGNOSIS — R140 Abdominal distension (gaseous): Secondary | ICD-10-CM | POA: Diagnosis not present

## 2014-06-20 DIAGNOSIS — K5900 Constipation, unspecified: Secondary | ICD-10-CM | POA: Diagnosis not present

## 2014-06-20 DIAGNOSIS — Z86010 Personal history of colonic polyps: Secondary | ICD-10-CM | POA: Diagnosis not present

## 2014-06-20 DIAGNOSIS — R1032 Left lower quadrant pain: Secondary | ICD-10-CM | POA: Diagnosis not present

## 2014-06-20 DIAGNOSIS — Z1211 Encounter for screening for malignant neoplasm of colon: Secondary | ICD-10-CM | POA: Diagnosis not present

## 2014-07-05 ENCOUNTER — Ambulatory Visit: Payer: Medicare Other | Admitting: Internal Medicine

## 2014-07-12 DIAGNOSIS — D519 Vitamin B12 deficiency anemia, unspecified: Secondary | ICD-10-CM | POA: Diagnosis not present

## 2014-07-12 DIAGNOSIS — D869 Sarcoidosis, unspecified: Secondary | ICD-10-CM | POA: Diagnosis not present

## 2014-07-12 DIAGNOSIS — R609 Edema, unspecified: Secondary | ICD-10-CM | POA: Diagnosis not present

## 2014-07-12 DIAGNOSIS — E1165 Type 2 diabetes mellitus with hyperglycemia: Secondary | ICD-10-CM | POA: Diagnosis not present

## 2014-07-12 DIAGNOSIS — Z79899 Other long term (current) drug therapy: Secondary | ICD-10-CM | POA: Diagnosis not present

## 2014-07-12 DIAGNOSIS — R0602 Shortness of breath: Secondary | ICD-10-CM | POA: Diagnosis not present

## 2014-07-12 DIAGNOSIS — R079 Chest pain, unspecified: Secondary | ICD-10-CM | POA: Diagnosis not present

## 2014-07-12 DIAGNOSIS — M2550 Pain in unspecified joint: Secondary | ICD-10-CM | POA: Diagnosis not present

## 2014-07-12 DIAGNOSIS — I2510 Atherosclerotic heart disease of native coronary artery without angina pectoris: Secondary | ICD-10-CM | POA: Diagnosis not present

## 2014-07-12 DIAGNOSIS — F329 Major depressive disorder, single episode, unspecified: Secondary | ICD-10-CM | POA: Diagnosis not present

## 2014-07-12 DIAGNOSIS — M545 Low back pain: Secondary | ICD-10-CM | POA: Diagnosis not present

## 2014-07-13 ENCOUNTER — Other Ambulatory Visit: Payer: Self-pay | Admitting: Gastroenterology

## 2014-07-27 ENCOUNTER — Other Ambulatory Visit (HOSPITAL_COMMUNITY): Payer: Self-pay | Admitting: Internal Medicine

## 2014-07-27 DIAGNOSIS — R609 Edema, unspecified: Secondary | ICD-10-CM

## 2014-08-01 ENCOUNTER — Ambulatory Visit (HOSPITAL_COMMUNITY)
Admission: RE | Admit: 2014-08-01 | Discharge: 2014-08-01 | Disposition: A | Discharge status: Home / Self Care | Payer: Medicare Other | Source: Ambulatory Visit | Attending: Cardiovascular Disease | Admitting: Cardiovascular Disease

## 2014-08-01 DIAGNOSIS — E785 Hyperlipidemia, unspecified: Secondary | ICD-10-CM | POA: Diagnosis not present

## 2014-08-01 DIAGNOSIS — R079 Chest pain, unspecified: Secondary | ICD-10-CM | POA: Diagnosis not present

## 2014-08-01 DIAGNOSIS — I10 Essential (primary) hypertension: Secondary | ICD-10-CM | POA: Diagnosis not present

## 2014-08-01 DIAGNOSIS — R609 Edema, unspecified: Secondary | ICD-10-CM | POA: Diagnosis not present

## 2014-08-01 DIAGNOSIS — D869 Sarcoidosis, unspecified: Secondary | ICD-10-CM | POA: Diagnosis not present

## 2014-08-16 ENCOUNTER — Emergency Department (HOSPITAL_COMMUNITY): Admission: EM | Admit: 2014-08-16 | Discharge: 2014-08-16 | Discharge status: Absent without leave | Payer: Self-pay

## 2014-08-16 DIAGNOSIS — M25511 Pain in right shoulder: Secondary | ICD-10-CM | POA: Diagnosis not present

## 2014-08-16 DIAGNOSIS — M79641 Pain in right hand: Secondary | ICD-10-CM | POA: Diagnosis not present

## 2014-08-21 DIAGNOSIS — M79641 Pain in right hand: Secondary | ICD-10-CM | POA: Diagnosis not present

## 2014-08-25 DIAGNOSIS — Z1231 Encounter for screening mammogram for malignant neoplasm of breast: Secondary | ICD-10-CM | POA: Diagnosis not present

## 2014-08-31 ENCOUNTER — Encounter (HOSPITAL_COMMUNITY): Payer: Self-pay | Admitting: *Deleted

## 2014-09-06 ENCOUNTER — Encounter (HOSPITAL_COMMUNITY): Payer: Self-pay | Admitting: Anesthesiology

## 2014-09-06 NOTE — Anesthesia Preprocedure Evaluation (Signed)
Anesthesia Evaluation  Patient identified by MRN, date of birth, ID band Patient awake    Reviewed: Allergy & Precautions, NPO status , Patient's Chart, lab work & pertinent test results  History of Anesthesia Complications (+) Family history of anesthesia reaction  Airway Mallampati: II  TM Distance: >3 FB Neck ROM: Full    Dental no notable dental hx.    Pulmonary shortness of breath, sleep apnea , COPD COPD inhaler, former smoker,  breath sounds clear to auscultation  Pulmonary exam normal       Cardiovascular hypertension, Pt. on medications + CAD Normal cardiovascular exam+ dysrhythmias + Valvular Problems/Murmurs Rhythm:Regular Rate:Normal     Neuro/Psych  Headaches, Anxiety  Neuromuscular disease    GI/Hepatic Neg liver ROS, GERD-  Medicated,  Endo/Other  diabetes, Type 2, Oral Hypoglycemic Agents  Renal/GU negative Renal ROS  negative genitourinary   Musculoskeletal  (+) Fibromyalgia -  Abdominal   Peds negative pediatric ROS (+)  Hematology  (+) anemia ,   Anesthesia Other Findings   Reproductive/Obstetrics negative OB ROS                             Anesthesia Physical Anesthesia Plan  ASA: III  Anesthesia Plan: MAC   Post-op Pain Management:    Induction: Intravenous  Airway Management Planned: Natural Airway  Additional Equipment:   Intra-op Plan:   Post-operative Plan:   Informed Consent: I have reviewed the patients History and Physical, chart, labs and discussed the procedure including the risks, benefits and alternatives for the proposed anesthesia with the patient or authorized representative who has indicated his/her understanding and acceptance.   Dental advisory given  Plan Discussed with: CRNA  Anesthesia Plan Comments:         Anesthesia Quick Evaluation

## 2014-09-07 ENCOUNTER — Ambulatory Visit (HOSPITAL_COMMUNITY)
Admission: RE | Admit: 2014-09-07 | Discharge: 2014-09-07 | Disposition: A | Discharge status: Home / Self Care | Payer: Medicare Other | Source: Ambulatory Visit | Attending: Gastroenterology | Admitting: Gastroenterology

## 2014-09-07 ENCOUNTER — Ambulatory Visit (HOSPITAL_COMMUNITY): Payer: Medicare Other | Admitting: Anesthesiology

## 2014-09-07 ENCOUNTER — Encounter (HOSPITAL_COMMUNITY)
Admission: RE | Disposition: A | Discharge status: Home / Self Care | Payer: Self-pay | Source: Ambulatory Visit | Attending: Gastroenterology

## 2014-09-07 ENCOUNTER — Encounter (HOSPITAL_COMMUNITY): Payer: Self-pay | Admitting: *Deleted

## 2014-09-07 DIAGNOSIS — K5730 Diverticulosis of large intestine without perforation or abscess without bleeding: Secondary | ICD-10-CM | POA: Diagnosis not present

## 2014-09-07 DIAGNOSIS — K219 Gastro-esophageal reflux disease without esophagitis: Secondary | ICD-10-CM | POA: Insufficient documentation

## 2014-09-07 DIAGNOSIS — I2510 Atherosclerotic heart disease of native coronary artery without angina pectoris: Secondary | ICD-10-CM | POA: Diagnosis not present

## 2014-09-07 DIAGNOSIS — Z7951 Long term (current) use of inhaled steroids: Secondary | ICD-10-CM | POA: Diagnosis not present

## 2014-09-07 DIAGNOSIS — K317 Polyp of stomach and duodenum: Secondary | ICD-10-CM | POA: Insufficient documentation

## 2014-09-07 DIAGNOSIS — F419 Anxiety disorder, unspecified: Secondary | ICD-10-CM | POA: Insufficient documentation

## 2014-09-07 DIAGNOSIS — Z87891 Personal history of nicotine dependence: Secondary | ICD-10-CM | POA: Insufficient documentation

## 2014-09-07 DIAGNOSIS — Z86010 Personal history of colonic polyps: Secondary | ICD-10-CM | POA: Diagnosis not present

## 2014-09-07 DIAGNOSIS — M797 Fibromyalgia: Secondary | ICD-10-CM | POA: Insufficient documentation

## 2014-09-07 DIAGNOSIS — Z7982 Long term (current) use of aspirin: Secondary | ICD-10-CM | POA: Diagnosis not present

## 2014-09-07 DIAGNOSIS — D122 Benign neoplasm of ascending colon: Secondary | ICD-10-CM | POA: Insufficient documentation

## 2014-09-07 DIAGNOSIS — Z1211 Encounter for screening for malignant neoplasm of colon: Secondary | ICD-10-CM | POA: Diagnosis not present

## 2014-09-07 DIAGNOSIS — E785 Hyperlipidemia, unspecified: Secondary | ICD-10-CM | POA: Diagnosis not present

## 2014-09-07 DIAGNOSIS — Z79899 Other long term (current) drug therapy: Secondary | ICD-10-CM | POA: Insufficient documentation

## 2014-09-07 DIAGNOSIS — K635 Polyp of colon: Secondary | ICD-10-CM | POA: Insufficient documentation

## 2014-09-07 DIAGNOSIS — G4730 Sleep apnea, unspecified: Secondary | ICD-10-CM | POA: Diagnosis not present

## 2014-09-07 DIAGNOSIS — I10 Essential (primary) hypertension: Secondary | ICD-10-CM | POA: Diagnosis not present

## 2014-09-07 DIAGNOSIS — E119 Type 2 diabetes mellitus without complications: Secondary | ICD-10-CM | POA: Diagnosis not present

## 2014-09-07 DIAGNOSIS — J449 Chronic obstructive pulmonary disease, unspecified: Secondary | ICD-10-CM | POA: Diagnosis not present

## 2014-09-07 HISTORY — PX: ESOPHAGOGASTRODUODENOSCOPY (EGD) WITH PROPOFOL: SHX5813

## 2014-09-07 HISTORY — PX: COLONOSCOPY WITH PROPOFOL: SHX5780

## 2014-09-07 LAB — GLUCOSE, CAPILLARY: GLUCOSE-CAPILLARY: 106 mg/dL — AB (ref 65–99)

## 2014-09-07 SURGERY — ESOPHAGOGASTRODUODENOSCOPY (EGD) WITH PROPOFOL
Anesthesia: Monitor Anesthesia Care

## 2014-09-07 MED ORDER — GLYCOPYRROLATE 0.2 MG/ML IJ SOLN
INTRAMUSCULAR | Status: DC | PRN
  Administered 2014-09-07: 0.2 mg via INTRAVENOUS

## 2014-09-07 MED ORDER — PROPOFOL 10 MG/ML IV BOLUS
INTRAVENOUS | Status: AC
  Filled 2014-09-07: qty 20

## 2014-09-07 MED ORDER — PROPOFOL INFUSION 10 MG/ML OPTIME
INTRAVENOUS | Status: DC | PRN
  Administered 2014-09-07: 300 ug/kg/min via INTRAVENOUS

## 2014-09-07 MED ORDER — BUTAMBEN-TETRACAINE-BENZOCAINE 2-2-14 % EX AERO
INHALATION_SPRAY | CUTANEOUS | Status: DC | PRN
  Administered 2014-09-07: 2 via TOPICAL

## 2014-09-07 MED ORDER — LIDOCAINE HCL (CARDIAC) 20 MG/ML IV SOLN
INTRAVENOUS | Status: DC | PRN
  Administered 2014-09-07: 100 mg via INTRAVENOUS

## 2014-09-07 MED ORDER — LACTATED RINGERS IV SOLN
INTRAVENOUS | Status: DC
  Administered 2014-09-07: 1000 mL via INTRAVENOUS

## 2014-09-07 MED ORDER — SODIUM CHLORIDE 0.9 % IV SOLN: INTRAVENOUS | Status: DC

## 2014-09-07 MED ORDER — LIDOCAINE HCL (CARDIAC) 20 MG/ML IV SOLN
INTRAVENOUS | Status: AC
  Filled 2014-09-07: qty 5

## 2014-09-07 MED ORDER — GLYCOPYRROLATE 0.2 MG/ML IJ SOLN
INTRAMUSCULAR | Status: AC
  Filled 2014-09-07: qty 1

## 2014-09-07 SURGICAL SUPPLY — 25 items

## 2014-09-07 NOTE — Discharge Instructions (Signed)
Conscious Sedation, Adult, Care After °Refer to this sheet in the next few weeks. These instructions provide you with information on caring for yourself after your procedure. Your health care provider may also give you more specific instructions. Your treatment has been planned according to current medical practices, but problems sometimes occur. Call your health care provider if you have any problems or questions after your procedure. °WHAT TO EXPECT AFTER THE PROCEDURE  °After your procedure: °· You may feel sleepy, clumsy, and have poor balance for several hours. °· Vomiting may occur if you eat too soon after the procedure. °HOME CARE INSTRUCTIONS °· Do not participate in any activities where you could become injured for at least 24 hours. Do not: °· Drive. °· Swim. °· Ride a bicycle. °· Operate heavy machinery. °· Cook. °· Use power tools. °· Climb ladders. °· Work from a high place. °· Do not make important decisions or sign legal documents until you are improved. °· If you vomit, drink water, juice, or soup when you can drink without vomiting. Make sure you have little or no nausea before eating solid foods. °· Only take over-the-counter or prescription medicines for pain, discomfort, or fever as directed by your health care provider. °· Make sure you and your family fully understand everything about the medicines given to you, including what side effects may occur. °· You should not drink alcohol, take sleeping pills, or take medicines that cause drowsiness for at least 24 hours. °· If you smoke, do not smoke without supervision. °· If you are feeling better, you may resume normal activities 24 hours after you were sedated. °· Keep all appointments with your health care provider. °SEEK MEDICAL CARE IF: °· Your skin is pale or bluish in color. °· You continue to feel nauseous or vomit. °· Your pain is getting worse and is not helped by medicine. °· You have bleeding or swelling. °· You are still sleepy or  feeling clumsy after 24 hours. °SEEK IMMEDIATE MEDICAL CARE IF: °· You develop a rash. °· You have difficulty breathing. °· You develop any type of allergic problem. °· You have a fever. °MAKE SURE YOU: °· Understand these instructions. °· Will watch your condition. °· Will get help right away if you are not doing well or get worse. °Document Released: 11/17/2012 Document Reviewed: 11/17/2012 °[employer]® Patient Information ©2015 [employer]. This information is not intended to replace advice given to you by your health care provider. Make sure you discuss any questions you have with your health care provider. ° °Esophagogastroduodenoscopy °Care After °Refer to this sheet in the next few weeks. These instructions provide you with information on caring for yourself after your procedure. Your caregiver may also give you more specific instructions. Your treatment has been planned according to current medical practices, but problems sometimes occur. Call your caregiver if you have any problems or questions after your procedure.  °HOME CARE INSTRUCTIONS °· Do not eat or drink anything until the numbing medicine (local anesthetic) has worn off and your gag reflex has returned. You will know that the local anesthetic has worn off when you can swallow comfortably. °· Do not drive for 12 hours after the procedure or as directed by your caregiver. °· Only take medicines as directed by your caregiver. °SEEK MEDICAL CARE IF:  °· You cannot stop coughing. °· You are not urinating at all or less than usual. °SEEK IMMEDIATE MEDICAL CARE IF: °· You have difficulty swallowing. °· You cannot eat or drink. °·   drink.  You have worsening throat or chest pain.  You have dizziness, lightheadedness, or you faint.  You have nausea or vomiting.  You have chills.  You have a fever.  You have severe abdominal pain.  You have black, tarry, or bloody stools. Document Released: 01/14/2012 Document Reviewed: 01/14/2012 [facility] Patient  Information 2015 [place]. This information is not intended to replace advice given to you by your health care provider. Make sure you discuss any questions you have with your health care provider.  Colonoscopy, Care After Refer to this sheet in the next few weeks. These instructions provide you with information on caring for yourself after your procedure. Your health care provider may also give you more specific instructions. Your treatment has been planned according to current medical practices, but problems sometimes occur. Call your health care provider if you have any problems or questions after your procedure. WHAT TO EXPECT AFTER THE PROCEDURE  After your procedure, it is typical to have the following:  A small amount of blood in your stool.  Moderate amounts of gas and mild abdominal cramping or bloating. HOME CARE INSTRUCTIONS  Do not drive, operate machinery, or sign important documents for 24 hours.  You may shower and resume your regular physical activities, but move at a slower pace for the first 24 hours.  Take frequent rest periods for the first 24 hours.  Walk around or put a warm pack on your abdomen to help reduce abdominal cramping and bloating.  Drink enough fluids to keep your urine clear or pale yellow.  You may resume your normal diet as instructed by your health care provider. Avoid heavy or fried foods that are hard to digest.  Avoid drinking alcohol for 24 hours or as instructed by your health care provider.  Only take over-the-counter or prescription medicines as directed by your health care provider.  If a tissue sample (biopsy) was taken during your procedure:  Do not take aspirin or blood thinners for 7 days, or as instructed by your health care provider.  Do not drink alcohol for 7 days, or as instructed by your health care provider.  Eat soft foods for the first 24 hours. SEEK MEDICAL CARE IF: You have persistent spotting of blood in your  stool 2-3 days after the procedure. SEEK IMMEDIATE MEDICAL CARE IF:  You have more than a small spotting of blood in your stool.  You pass large blood clots in your stool.  Your abdomen is swollen (distended).  You have nausea or vomiting.  You have a fever.  You have increasing abdominal pain that is not relieved with medicine. Document Released: 09/11/2003 Document Revised: 11/17/2012 Document Reviewed: 10/04/2012 [facility] Patient Information 2015 [place], [place]. This information is not intended to replace advice given to you by your health care provider. Make sure you discuss any questions you have with your health care provider.

## 2014-09-07 NOTE — Op Note (Signed)
[place], [id number]   OPERATIVE PROCEDURE REPORT  PATIENT :Sheila Reynolds, Sheila Reynolds  MR#: #[id number] BIRTHDATE :1945/04/22 GENDER: female ENDOSCOPIST: Edmonia James, MD ASSISTANT:   Elspeth Cho, technician Laverta Baltimore, RN PROCEDURE DATE: Oct 02, 2014 PRE-PROCEDURE PREPERATION: Patient fasted for 4 hours prior to procedure. PRE-PROCEDURE PHYSICAL: Patient has stable vital signs.  Neck is supple.  There is no JVD, thyromegaly or LAD.  Chest clear to auscultation.  S1 and S2 regular.  Abdomen soft, non-distended, non-tender with NABS. PROCEDURE:     EGD with multiple cold biopsies. ASA CLASS:     Class III INDICATIONS:     GERD. MEDICATIONS:     Monitored anesthesia care TOPICAL ANESTHETIC:   none  DESCRIPTION OF PROCEDURE: After the risks benefits and alternatives of the procedure were thoroughly explained, informed consent was obtained.  The Pentax Gastroscope Peds [id number]  was introduced through the mouth and advanced to the second portion of the duodenum , without limitations. The instrument was slowly withdrawn as the mucosa was fully examined. Estimated blood loss is zero unless otherwise noted in this procedure report.      ESOPHAGUS: The esophagus was widely patent and the mucosa of the esophagus appeared normal.  STOMACH: Multiple small sessile polyps were found in the body-6 of these were removed by cold biopsies were performed.  DUODENUM: The duodenal mucosa showed no abnormalities.  There were no ulcers, erosions or masses noted. Retroflexed views revealed no abnormalities. . The scope was then withdrawn from the patient and the procedure terminated. The patient tolerated the procedure without immediate complications.  IMPRESSION:  There were multiple small sessile polyps in the stomch-6 of these were removed by cold biopsies; otherwise normal EGD.  RECOMMENDATIONS:     1.  Anti-reflux regimen to be  followed. 2.  Avoid NSAIDS for two weeks. 3.  Await pathology results. 4.  Continue current medications. 5.  Continue PPI's 6.  OP follow-up is advised on a PRN basis.  REPEAT EXAM:  no repeat exam necessary.  DISCHARGE INSTRUCTIONS: standard discharge instructions given. _______________________________ eSignedEdmonia James, MD 10-02-2014 8:22 AM   CPT CODES:     [phone] Upper gastrointestinal endoscopy including esophagus, stomach, and either the duodenum and/or jejunum as appropriate; with biopsy, single or multiple DIAGNOSIS CODES:     K21.9,  K31.7  The ICD and CPT codes recommended by this software are interpretations from the data that the clinical staff has captured with the software.  The verification of the translation of this report to the ICD and CPT codes and modifiers is the sole responsibility of the health care institution and practicing physician where this report was generated.  [employer] will not be held responsible for the validity of the ICD and CPT codes included on this report.  AMA assumes no liability for data contained or not contained herein. CPT is a Designer, television/film set of the [employer].   CC: Glendale Chard, M.D.  PATIENT NAME:  Amil, Moseman MR#: #[id number]

## 2014-09-07 NOTE — Anesthesia Postprocedure Evaluation (Signed)
  Anesthesia Post-op Note  Patient: Sheila Reynolds  Procedure(s) Performed: Procedure(s) (LRB): ESOPHAGOGASTRODUODENOSCOPY (EGD) WITH PROPOFOL (N/A) COLONOSCOPY WITH PROPOFOL (N/A)  Patient Location: PACU  Anesthesia Type: MAC  Level of Consciousness: awake and alert   Airway and Oxygen Therapy: Patient Spontanous Breathing  Post-op Pain: mild  Post-op Assessment: Post-op Vital signs reviewed, Patient's Cardiovascular Status Stable, Respiratory Function Stable, Patent Airway and No signs of Nausea or vomiting  Last Vitals:  Filed Vitals:   09/07/14 0840  BP: 156/84  Temp:   Resp:     Post-op Vital Signs: stable   Complications: No apparent anesthesia complications

## 2014-09-07 NOTE — Transfer of Care (Signed)
Immediate Anesthesia Transfer of Care Note  Patient: Sheila Reynolds  Procedure(s) Performed: Procedure(s): ESOPHAGOGASTRODUODENOSCOPY (EGD) WITH PROPOFOL (N/A) COLONOSCOPY WITH PROPOFOL (N/A)  Patient Location: PACU  Anesthesia Type:MAC  Level of Consciousness: awake, sedated and patient cooperative  Airway & Oxygen Therapy: Patient Spontanous Breathing and Patient connected to face mask oxygen  Post-op Assessment: Report given to RN, Post -op Vital signs reviewed and stable and Patient moving all extremities X 4  Post vital signs: stable  Last Vitals:  Filed Vitals:   09/07/14 0815  BP: 103/49  Temp: 36.2 C  Resp:     Complications: No apparent anesthesia complications

## 2014-09-07 NOTE — Op Note (Signed)
[facility] [place], [address]   OPERATIVE PROCEDURE REPORT  PATIENT: Sheila Reynolds, Sheila Reynolds  MR#: [id number] BIRTHDATE: August 10, 1945 GENDER: female ENDOSCOPIST: Edmonia James, MD ASSISTANT:   Elspeth Cho, technician & Laverta Baltimore, RN. PROCEDURE DATE: October 06, 2014 PRE-PROCEDURE PREPARATION: Patient fasted for 4 hours prior to procedure. The patient was prepped with a gallon of Golytely the night prior to the procedure.  The patient has fasted for 4 hours prior to the procedure PRE-PROCEDURE PHYSICAL: Patient has stable vital signs. Neck is supple. There is no JVD, thyromegaly or LAD.  Chest clear to auscultation.  S1 and S2 regular.  Abdomen soft, non-distended, non-tender with NABS. PROCEDURE:     Colonoscopy with cold biopsies x 4 and cold snare polypectomy x 1. ASA CLASS:     Class III INDICATIONS:     1.  Personal history of colonic polyps-tubular adenoma 2. CRC screening.[name] MEDICATIONS:     Monitored anesthesia care  DESCRIPTION OF PROCEDURE: After the risks, benefits, and alternatives of the procedure were thoroughly explained [including a 10% missed rate of cancer and polyps], informed consent was obtained. Digital rectal exam was performed. The Pentax Ped Colon [id number]  was introduced through the anus  and advanced to the cecum, which was identified by both the appendix and ileocecal valve  limited by No adverse events experienced.  The quality of the prep was adequate . Multiple washes were done. Small lesions could be missed. The instrument was then slowly withdrawn as the colon was fully examined. Estimated blood loss is zero unless otherwise noted in this procedure report.     COLON FINDINGS: Three dimunitive polyps were found in the ascending colon-these were removed by cold biopsies. One 6 mm sessile polyp was removed by cold snare from the proximal right colon. A few scattered diverticula were noted in the right colon. The  rest of the colonic mucosa appeared healthy with a normal vascular pattern. No masses or AVMs were noted. The appendiceal orifice and the ICV were identified and photographed. Retroflexed views revealed no abnormalities. The patient tolerated the procedure without immediate complications.  The scope was then withdrawn from the patient and the procedure terminated.  TIME TO CECUM:    04 minutes 00 seconds WITHDRAW TIME:   12 minutes 00 seconds  IMPRESSION:     1) Three dimunitive polyps were found in the ascending colon and were removed by cold biopsies. 2) One 6 mm sessile proximal right colon polyp removed by cold snare x 1. 3) Few right sided diverticula.  RECOMMENDATIONS:     1.  Hold Aspirin and all other NSAIDS for 2 weeks. 2.  Await pathology results. 3.  Continue current medications. 4.  High fiber diet with liberal fluid intake. 5.  OP follow-up is advised on a PRN basis.  REPEAT EXAM:      In 5 years  for a repeat colonoscopy. If the patient has any abnormal GI symptoms in the interim, she have been advised to contact the office as soon as possible for further recommendations.   REFERRED PF:[id number] Baird [facility], M.D. eSigned:  Edmonia James, MD 10/06/14 8:31 AM  CPT CODES:     [id number] Colonoscopy, flexible, proximal to splenic flexure; with removal of tumor(s), polyp(s), or other lesion(s) by snare technique ICD CODES:     K63.5 Colonic polyp; Z86.010 Personal history of colonic polyps z12.11, CRC screening.  The ICD and CPT codes recommended by this software are interpretations from the data that the  clinical staff has captured with the software.  The verification of the translation of this report to the ICD and CPT codes and modifiers is the sole responsibility of the health care institution and practicing physician where this report was generated.  [employer] will not be held responsible for the validity of the ICD and CPT codes included on this  report.  AMA assumes no liability for data contained or not contained herein. CPT is a Designer, television/film set of the [employer].  PATIENT NAME:  Sheila Reynolds, Sheila Reynolds MR#: [id number]

## 2014-09-07 NOTE — H&P (Signed)
Sheila Reynolds is an 69 y.o. female.   Chief Complaint: Colrectal cancer screening. HPI: 69 year old black female here for an EGD/Colonoscopy. See office notes for details.  Past Medical History  Diagnosis Date  . Hyperlipidemia   . Chest pain   . Nonsustained ventricular tachycardia   . Fibromyalgia   . Coronary artery disease     Mild to moderate CAD  . Palpitations   . Hx of echocardiogram     Echo (03/2013):  Tech limited; Mild focal basal septal hypertrophy, EF 60-65%, normal RVF  . Family history of anesthesia complication     daughter N/V  . Heart murmur   . Dysrhythmia     has fluttering at times  . Shortness of breath     loses breathe at times and have to take extra breathes  . Diabetes mellitus without complication   . Hypertension   . Bronchitis with emphysema   . Anxiety     on meds  . GERD (gastroesophageal reflux disease)     on meds  . Headache(784.0)   . Anemia     3 months ago anemic   Past Surgical History  Procedure Laterality Date  . Cardiac catheterization  05/04/2007    reveals overall normal left ventricular systolic function. Ejection fraction 65-70%  . Total abdominal hysterectomy  1982  . Breast surgery  approx 1992    bilateral breast surgery for fibrocystic breast disease  . Tonsillectomy    . Colonoscopy w/ biopsies and polypectomy    . Cataract extraction w/phaco Right 07/20/2013    Procedure: CATARACT EXTRACTION PHACO AND INTRAOCULAR LENS PLACEMENT (IOC);  Surgeon: Marylynn Pearson, MD;  Location: [facility];  Service: Ophthalmology;  Laterality: Right;  . Mini shunt insertion Right 07/20/2013    Procedure: INSERTION OF GLAUCOMA FILTRATION DEVICE RIGHT EYE;  Surgeon: Marylynn Pearson, MD;  Location: [facility];  Service: Ophthalmology;  Laterality: Right;  . Mitomycin c application Right 3/00/7622    Procedure: MITOMYCIN C APPLICATION;  Surgeon: Marylynn Pearson, MD;  Location: [facility];  Service: Ophthalmology;  Laterality: Right;   Family History  Problem  Relation Age of Onset  . Heart disease Father   . Heart disease Paternal Grandmother   . Cancer Mother     unknown type, ?lung  . Cancer Paternal Grandmother     unknown type  . Diabetes Father   . Diabetes Brother   . Glaucoma Father   . Glaucoma Brother    Social History:  reports that she quit smoking about 34 years ago. Her smoking use included Cigarettes. She has a 4 pack-year smoking history. She has never used smokeless tobacco. She reports that she does not drink alcohol or use illicit drugs.  Allergies:  Allergies  Allergen Reactions  . Crestor [Rosuvastatin Calcium] Other (See Comments)    muscle aches  . Demerol [Meperidine] Other (See Comments)    Hallucinations   . Shellfish Allergy     Crab, shrimp and lobster ---lips itch and tingle   Medications Prior to Admission  Medication Sig Dispense Refill  . Albuterol Sulfate (VENTOLIN HFA IN) Inhale 2 puffs into the lungs 4 (four) times daily as needed (shortness of breath).     . ALPRAZolam (XANAX) 0.5 MG tablet Take 0.25 mg by mouth at bedtime as needed for anxiety or sleep. For sleep    . aspirin 81 MG tablet Take 81 mg by mouth daily.      [name] atorvastatin (LIPITOR) 20 MG tablet  TAKE 1 TABLET BY MOUTH EVERY DAY 30 tablet 6  . brimonidine (ALPHAGAN P) 0.1 % SOLN Place 1 drop into both eyes every 8 (eight) hours.    . brinzolamide (AZOPT) 1 % ophthalmic suspension Place 1 drop into both eyes 3 (three) times daily.     [name] esomeprazole (NEXIUM) 40 MG capsule Take 40 mg by mouth 2 (two) times daily as needed (reflux).     . furosemide (LASIX) 20 MG tablet Take 1 tablet (20 mg total) by mouth daily. (Patient taking differently: 1 tablet three times a week) 30 tablet 11  . hydrochlorothiazide (HYDRODIURIL) 25 MG tablet Take 25 mg by mouth 3 (three) times a week.     [name] JANUMET 50-500 MG per tablet Take 1 tablet by mouth Twice daily.    [name] loteprednol (LOTEMAX) 0.5 % ophthalmic suspension Place 1 drop into the left eye 2 (two) times  daily.     . metoprolol (LOPRESSOR) 50 MG tablet TAKE 1 TABLET BY MOUTH 2 TIMES DAILY. 60 tablet 6  . mometasone-formoterol (DULERA) 200-5 MCG/ACT AERO Inhale 2 puffs into the lungs 2 (two) times daily. 1 Inhaler 0  . montelukast (SINGULAIR) 10 MG tablet Take 10 mg by mouth at bedtime.    . potassium chloride (K-DUR) 10 MEQ tablet Take 1 tablet (10 mEq total) by mouth daily. (Patient taking differently: Take 10 mEq by mouth every other day. ) 90 tablet 3  . pregabalin (LYRICA) 100 MG capsule Take 100 mg by mouth at bedtime.     . Travoprost (TRAVATAN OP) Place 1 drop into both eyes at bedtime.     . ONE TOUCH ULTRA TEST test strip 1 each by Other route as needed.      Review of Systems  Constitutional: Negative.   HENT: Negative.   Eyes: Negative.   Respiratory: Negative.   Cardiovascular: Negative.   Gastrointestinal: Positive for heartburn.  Genitourinary: Negative.   Musculoskeletal: Positive for joint pain.  Neurological: Negative.   Endo/Heme/Allergies: Negative.   Psychiatric/Behavioral: Positive for depression. The patient has insomnia.    There were no vitals taken for this visit. Physical Exam  Constitutional: She is oriented to person, place, and time. She appears well-developed and well-nourished.  HENT:  Head: Normocephalic and atraumatic.  Eyes: Conjunctivae and EOM are normal. Pupils are equal, round, and reactive to light.  Neck: Normal range of motion. Neck supple.  Cardiovascular: Normal rate and regular rhythm.   Respiratory: Effort normal and breath sounds normal.  GI: Soft. Bowel sounds are normal.  Neurological: She is alert and oriented to person, place, and time.  Skin: Skin is warm and dry.  Psychiatric: She has a normal mood and affect. Her behavior is normal. Judgment and thought content normal.   Assessment/Plan Colorectal cancer screening/GERD: proceed with an EGD/Colonoscopy at this time.  Sheila Reynolds 09/07/2014, 7:10 AM

## 2014-09-08 ENCOUNTER — Encounter (HOSPITAL_COMMUNITY): Payer: Self-pay | Admitting: Gastroenterology

## 2014-09-14 ENCOUNTER — Encounter: Payer: Self-pay | Admitting: Internal Medicine

## 2014-09-14 ENCOUNTER — Ambulatory Visit (INDEPENDENT_AMBULATORY_CARE_PROVIDER_SITE_OTHER): Payer: Medicare Other | Admitting: Internal Medicine

## 2014-09-14 ENCOUNTER — Telehealth: Payer: Self-pay | Admitting: Internal Medicine

## 2014-09-14 VITALS — BP 136/88 | HR 90 | Ht 62.0 in | Wt 167.0 lb

## 2014-09-14 DIAGNOSIS — J418 Mixed simple and mucopurulent chronic bronchitis: Secondary | ICD-10-CM | POA: Diagnosis not present

## 2014-09-14 DIAGNOSIS — G4733 Obstructive sleep apnea (adult) (pediatric): Secondary | ICD-10-CM

## 2014-09-14 MED ORDER — PROMETHAZINE-CODEINE 6.25-10 MG/5ML PO SYRP: 5.0000 mL | ORAL_SOLUTION | ORAL | Status: DC | PRN

## 2014-09-14 MED ORDER — AZITHROMYCIN 250 MG PO TABS: ORAL_TABLET | ORAL | Status: DC

## 2014-09-14 NOTE — Progress Notes (Signed)
02/05/11- 66 yoF former smoker seen on referral from Dr Estanislado Pandy for concern of sarcoid. 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 Shortness of breath has been chronic with one flight of stairs. She thinks she may be gradually getting worse. There is chronic dry cough which is been worse since a flulike illness 3 weeks ago. 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 I can't tell a specific arthritis diagnosis has been made. There is no history of liver or kidney disease. Bronchitis was diagnosed at the time of allergy skin testing a year ago. Food sensitivity to shrimp and crab which make her lips itch. Positive skin test also for grass and house dust. She has not been on allergy vaccine. A rescue inhaler has helped at times. 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 Advair was no help and Symbicort may have helped only a little. ACE 46 (8-52) 09/30/10 Medication choices been limited by glaucoma. No history of TB exposure. Past history of sinus infections. Active problem with heartburn which is partly suppressed with Nexium once or twice daily/ Dr Collene Mares. Married, now on short-term disability. Quit smoking 1980 Several family members on her father's side had sarcoid.  03/10/11- 66 yoF former smoker followed for bronchitis, hx sarcoid, complicated by GERD, glaucoma Dulera inhaler did not have significant improvement. She is off of methotrexate and prednisone as of this month. 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 She occasionally wakes coughing with thick white mucus, stress incontinence. Just occasionally tight. DOE with one flight of stairs and has to stop at the top to get her breath but  no longer has to catch her breath halfway down stairs carrying laundry. Tussionex no longer works as well for cough as it used to.  07/08/11- 65 yoF former smoker followed for bronchitis, hx sarcoid, complicated by GERD, glaucoma Iincreased cough and congestion(nasal and chest)Productive-green/yellow in color x 3 days. Denies any fever but has been having chills. SOB and wheezing along with cough. SOB is all the time. Need to check if okay to start back on Methtrexate again. Dr Estanislado Pandy has been following for sarcoid of the eye and wants to resume methotrexate. There has been increased cough, productive of  mucus, worse at night and supine. Maybe some chills. Stress incontinence. Arthralgias. Postnasal drip. GERD is fairly well controlled with Nexium. Taking tramadol now for pain so is not helping the cough. Failed benzonatate. This was a prolonged visit 40 minutes. CXR 02/09/11-images reviewed with her. IMPRESSION:  Stable examination. No active cardiopulmonary process.  Original Report Authenticated By: Vivia Ewing, M.D.    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  Will see Dr.Deveshwar next month for decision about restarting methotrexate. Persistent pain mid anterior chest through to back, worse with prolonged standing and kitchen, eased by leaning over. No reflux or dysphagia. Expects seasonal bronchitis but denies routine cough or dyspnea, fever or sweats. CXR 07/10/11- negative. ACE 07/08/11- 40/ WNL.  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   FOLLOWS FOR: still having problems with SOB with activity. No change in complaints of pain, mid anterior chest, left greater than right mostly around into her back and with prolonged standing she has to stop kitchen work to sit down. We discussed this as more of a back problem and a chest problem. She has seen Dr.Van Creed Copper w/ [facility] . Dr. Estanislado Pandy is treating with ibuprofen.  07/08/12- 85 yoF former smoker followed for bronchitis, hx sarcoid, complicated by GERD, glaucoma FOLLOWS FOR: went for ROV at [facility]-was told allergies were flared up and Deveshwar MD stated Sarcoid was flared up as well. Was given Rx's(currently on Prednisone taper) continues to cough-green and yellow with streaks of blood at times. (Dr Creed Copper) Complains of backache. Rhinitis is responded to nasal spray, Singulair, Allegra. Dr.Deveshwar feels for sarcoid might be flaring. She prescribed prednisone, methotrexate, folic acid. ACE level 07/08/11- 40 (8-52) CT chest 05/19/12 IMPRESSION:  . No explanation for the patient's history of back pain.  2. Despite the reported clinical history of sarcoidosis, there are  no imaging findings to suggest a diagnosis of sarcoidosis at this  time in this patient. Clinical correlation is recommended.  3. Mild centrilobular emphysema.  4. Atherosclerosis, including left anterior descending coronary  artery disease. Please note that although the presence of coronary  artery calcium documents the presence of coronary artery disease,  the severity of this disease and any potential stenosis cannot be  assessed on this non-gated CT examination. Assessment for  potential risk factor modification, dietary therapy or  pharmacologic therapy may be warranted, if clinically indicated.  5. Small pulmonary nodules in the lungs, as above. The largest of  these is a 6 mm ground-glass attenuation nodule in the right upper  lobe (image 16 of series 3). Initial follow-up by  chest CT without  contrast is recommended in 3 months to confirm persistence. This  recommendation follows the consensus statement: Recommendations for  the Management of Subsolid Pulmonary Nodules Detected at CT: A  Statement from the [employer] as published in Radiology  2013; 266:304-317.  Original Report Authenticated By: Vinnie Langton, M.D.  08/19/12- 70 yoF former smoker followed for bronchitis, hx sarcoid, complicated by GERD, glaucoma follows for:  discuss ct results  Occasional sweats, minor rash at right ankle, cough productive white, thick and occasionally yellow sputum. Some dyspnea with exertion, no chest pain or palpitation. ACE 07/08/12- 27 (8-52) CT chest 08/18/12 IMPRESSION:  1. New clustered peribronchovascular ill-defined nodularity in the  right upper lobe, indicative of an infectious bronchiolitis.  2. Previously measured 6 mm right upper lobe nodule is less  discrete on the current examination.  3. Coronary artery calcification.  Original Report Authenticated By: Lorin Picket, M.D.  02/25/13- 15 yoF former smoker followed for bronchitis/ bronchiolitis/ nodules, hx  Ocular sarcoid, complicated by GERD, glaucoma FOLLOWS FOR: review PFT, 6MW, CT Scan, and sputum cultures with patient. Pt states that she continues to have SOB with activitiy; when not active-she feels like she cant breathe Occasional cough, scant clear sputum, better with Dulera. Pains around chest, chronic, if she forces a deep breath. PFT 09/21/2012-mild obstructive airways disease with response to bronchodilator, moderate restriction, diffusion severely reduced. FVC 1.88/82%, FEV11.58/88%, FEV1/FVC 0.84, FEF 25-75% 1.86/106% with some response to bronchodilator in small airways. TLC 62%, DLCO 49%. 6MWT 09/21/12- WNL 97%, 97%, 98%, 381 m. Sputum culture-normal  flora, negative for AFB and fungus  08/25/13- 68 yoF former smoker followed for bronchitis/ bronchiolitis/ nodules, hx  Ocular sarcoid,  complicated by GERD, glaucoma FOLLOWS FOR: not using routine inhalers as she should; pt has good and bad days with breathing. Pt states her husabnd has noticed she has started snoring and gasping for air-has to be rolled over or get up and catch her breath. She was also told she stops breathing in her sleep and has noticed this during the day as well. CXR 6/ 4 /15 IMPRESSION:  No active cardiopulmonary disease.  Electronically Signed  By: Dereck Ligas M.D.  On: 07/14/2013 11:45  11/01/13-68 yoF former smoker followed for bronchitis/ bronchiolitis/ nodules, hx  Ocular sarcoid, OSA complicated by GERD, glaucoma FOLLOWS FOR: Pt c/o increased SOB with exertion, prod cough with clear to yellow mucus.  Review sleep study.  NPSG-10/18/13- Mild OSA, AHI 9.3/ hr, weight 164 lbs  To start CPAP auto  01/02/14- 68 yoF former smoker followed for bronchitis/ bronchiolitis/ nodules, hx  Ocular sarcoid, OSA complicated by GERD, glaucoma FOLLOWS FOR: Pt complains of increased coughing with production of white mucus. Has been using CPAP every night for about 7-9 hours. Mask is causing nose to become dry and causing sores in her nose. Talked with [facility] about this. Is not sleeping all night like she would like to. Fullface mask, auto 5-15/[facility] We reviewed chest x-ray showing bone island in the left rib-it was not a lung nodule after all  ROS-see HPI Constitutional:   No-   weight loss, +night sweats, fevers, chills, fatigue, lassitude. HEENT:   No-  headaches, difficulty swallowing, tooth/dental problems, sore throat,       Some  sneezing, itching, ear ache, +nasal congestion, post nasal drip,  CV:  +chest pain, no-orthopnea, PND, swelling in lower extremities, anasarca, dizziness, palpitations Resp: +  shortness of breath with exertion not at rest.              No-   productive cough,  + non-productive cough,  No- coughing up of blood.                No-change in color of mucus.  No- wheezing.   Skin: No-    rash or lesions. GI:  No-   heartburn, indigestion, abdominal pain, nausea, vomiting,  GU:  MS:  +  joint pain or swelling, + back pain Neuro-     nothing unusual Psych:  No- change in mood or affect. No depression or anxiety.  No memory loss.  OBJ General- Alert, Oriented, Affect anxious, Distress- none acute Skin- rash-none, lesions- none, excoriation- none Lymphadenopathy- none Head- atraumatic            Eyes- Gross vision intact, PERRLA, conjunctivae clear secretions- not injected            Ears- Hearing aid Left            Nose- + small scab right external nose, no-Septal dev, mucus, polyps, erosion, perforation             Throat- Mallampati II , mucosa clear , drainage- none, tonsils- atrophic Neck- flexible , trachea midline, no stridor , thyroid nl, carotid no bruit Chest - symmetrical excursion , unlabored           Heart/CV- RRR , no murmur , no gallop  , no rub, nl s1 s2                           -  JVD- none , edema- none, stasis changes- none, varices- none           Lung- clear,no-cough, no wheeze , dullness-none, rub- none           Chest wall-  Abd-  Br/ Gen/ Rectal- Not done, not indicated Extrem- cyanosis- none, clubbing, none, atrophy- none, strength- nl. +Boot L foot stress fx Neuro- grossly intact to observation    02/05/11- 65 yoF former smoker seen on referral from Dr Estanislado Pandy for concern of sarcoid. Sarcoid was diagnosed by a series of ophthalmologists at [facility] and elsewhere, 20 years ago. 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 Shortness of breath has been chronic with one flight of stairs. She thinks she may be gradually getting worse. There is chronic dry cough which is been worse since a flulike illness 3 weeks ago. 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 I can't tell a specific arthritis diagnosis has been made. There is no history of liver or kidney disease. Bronchitis was  diagnosed at the time of allergy skin testing a year ago. Food sensitivity to shrimp and crab which make her lips itch. Positive skin test also for grass and house dust. She has not been on allergy vaccine. A rescue inhaler has helped at times. 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 Advair was no help and Symbicort may have helped only a little. ACE 46 (8-52) 09/30/10 Medication choices been limited by glaucoma. No history of TB exposure. Past history of sinus infections. Active problem with heartburn which is partly suppressed with Nexium once or twice daily/ Dr Collene Mares. Married, now on short-term disability. Quit smoking 1980 Several family members on her father's side had sarcoid.  03/10/11- 72 yoF former smoker followed for bronchitis, hx sarcoid, complicated by GERD, glaucoma Dulera inhaler did not have significant improvement. She is off of methotrexate and prednisone as of this month. 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 She occasionally wakes coughing with thick white mucus, stress incontinence. Just occasionally tight. DOE with one flight of stairs and has to stop at the top to get her breath but no longer has to catch her breath halfway down stairs carrying laundry. Tussionex no longer works as well for cough as it used to.  07/08/11- 65 yoF former smoker followed for bronchitis, hx sarcoid, complicated by GERD, glaucoma Iincreased cough and congestion(nasal and chest)Productive-green/yellow in color x 3 days. Denies any fever but has been having chills. SOB and wheezing along with cough. SOB is all the time. Need to check if okay to start back on Methtrexate again. Dr Estanislado Pandy has been following for sarcoid of the eye and wants to resume methotrexate. There has been increased cough, productive of  mucus, worse at night and supine. Maybe  some chills. Stress incontinence. Arthralgias. Postnasal drip. GERD is fairly well controlled with Nexium. Taking tramadol now for pain so is not helping the cough. Failed benzonatate. This was a prolonged visit 40 minutes. CXR 02/09/11-images reviewed with her. IMPRESSION:  Stable examination. No active cardiopulmonary process.  Original Report Authenticated By: Vivia Ewing, M.D.    10/09/11- 52 yoF former smoker followed for bronchitis, hx sarcoid, complicated by GERD, glaucoma Cough-worse at night-yellow in color; drainage in throat; SOB and wheezing on bad days Active sarcoid uveitis- Dr Brewington/ Opth, Deveshwar/ Rheum, [facility].  Will see Dr.Deveshwar next month for decision about restarting methotrexate. Persistent pain mid anterior chest through to back, worse with prolonged standing and kitchen, eased by leaning over. No reflux or dysphagia. Expects seasonal bronchitis but denies routine cough or dyspnea, fever or sweats. CXR 07/10/11- negative. ACE 07/08/11- 40/ WNL.  05/10/12- 66 yoF former smoker followed for bronchitis, hx sarcoid, complicated by GERD, glaucoma Cough-worse at night-yellow in color; drainage in throat; SOB and wheezing on bad days Active sarcoid uveitis- Dr Brewington/ Opth, Deveshwar/ Rheum, [facility].  FOLLOWS FOR: still having problems with SOB with activity. No change in complaints of pain, mid anterior chest, left greater than right mostly around into her back and with prolonged standing she has to stop kitchen work to sit down. We discussed this as more of a back problem and a chest problem. She has seen Dr.Van Creed Copper w/ [facility] . Dr. Estanislado Pandy is treating with ibuprofen.  07/08/12- 73 yoF former smoker followed for bronchitis, hx sarcoid, complicated by GERD, glaucoma FOLLOWS FOR: went for ROV at [facility]-was told allergies were flared up and Deveshwar MD stated Sarcoid was flared up as well. Was given Rx's(currently  on Prednisone taper) continues to cough-green and yellow with streaks of blood at times. (Dr Creed Copper) Complains of backache. Rhinitis is responded to nasal spray, Singulair, Allegra. Dr.Deveshwar feels for sarcoid might be flaring. She prescribed prednisone, methotrexate, folic acid. ACE level 07/08/11- 40 (8-52) CT chest 05/19/12 IMPRESSION:  . No explanation for the patient's history of back pain.  2. Despite the reported clinical history of sarcoidosis, there are  no imaging findings to suggest a diagnosis of sarcoidosis at this  time in this patient. Clinical correlation is recommended.  3. Mild centrilobular emphysema.  4. Atherosclerosis, including left anterior descending coronary  artery disease. Please note that although the presence of coronary  artery calcium documents the presence of coronary artery disease,  the severity of this disease and any potential stenosis cannot be  assessed on this non-gated CT examination. Assessment for  potential risk factor modification, dietary therapy or  pharmacologic therapy may be warranted, if clinically indicated.  5. Small pulmonary nodules in the lungs, as above. The largest of  these is a 6 mm ground-glass attenuation nodule in the right upper  lobe (image 16 of series 3). Initial follow-up by chest CT without  contrast is recommended in 3 months to confirm persistence. This  recommendation follows the consensus statement: Recommendations for  the Management of Subsolid Pulmonary Nodules Detected at CT: A  Statement from the [employer] as published in Radiology  2013; 266:304-317.  Original Report Authenticated By: Vinnie Langton, M.D.  08/19/12- 14 yoF former smoker followed for bronchitis, hx sarcoid, complicated by GERD, glaucoma follows for:  discuss ct results  Occasional sweats, minor rash at right ankle, cough productive white, thick and occasionally yellow sputum. Some dyspnea with exertion, no chest pain or  palpitation. ACE 07/08/12- 27 (8-52) CT chest 08/18/12 IMPRESSION:  1. New clustered peribronchovascular ill-defined nodularity in the  right upper lobe, indicative of an infectious bronchiolitis.  2. Previously measured 6 mm right upper lobe nodule is less  discrete on the current examination.  3. Coronary artery calcification.  Original Report Authenticated By: Lorin Picket, M.D.  02/25/13- 13 yoF former smoker followed for bronchitis/ bronchiolitis/ nodules, hx  Ocular sarcoid, complicated by GERD, glaucoma FOLLOWS FOR: review PFT, 6MW, CT Scan, and sputum cultures with patient. Pt states that she continues to have  SOB with activitiy; when not active-she feels like she cant breathe Occasional cough, scant clear sputum, better with Dulera. Pains around chest, chronic, if she forces a deep breath. PFT 09/21/2012-mild obstructive airways disease with response to bronchodilator, moderate restriction, diffusion severely reduced. FVC 1.88/82%, FEV11.58/88%, FEV1/FVC 0.84, FEF 25-75% 1.86/106% with some response to bronchodilator in small airways. TLC 62%, DLCO 49%. 6MWT 09/21/12- WNL 97%, 97%, 98%, 381 m. Sputum culture-normal flora, negative for AFB and fungus  08/25/13- 68 yoF former smoker followed for bronchitis/ bronchiolitis/ nodules, hx  Ocular sarcoid, complicated by GERD, glaucoma FOLLOWS FOR: not using routine inhalers as she should; pt has good and bad days with breathing. Pt states her husabnd has noticed she has started snoring and gasping for air-has to be rolled over or get up and catch her breath. She was also told she stops breathing in her sleep and has noticed this during the day as well. CXR 6/ 4 /15 IMPRESSION:  No active cardiopulmonary disease.  Electronically Signed  By: Dereck Ligas M.D.  On: 07/14/2013 11:45  11/01/13-68 yoF former smoker followed for bronchitis/ bronchiolitis/ nodules, hx  Ocular sarcoid, OSA complicated by GERD, glaucoma FOLLOWS FOR: Pt c/o increased  SOB with exertion, prod cough with clear to yellow mucus.  Review sleep study.  NPSG-10/18/13- Mild OSA, AHI 9.3/ hr, weight 164 lbs  To start CPAP auto  05/03/14- 68 yoF former smoker followed for bronchitis/ bronchiolitis/ nodules, hx  Ocular sarcoid, OSA complicated by GERD, glaucoma FOLLOWS FOR: Pt reports using CPAP x 4 nights a week when increasd cough is not present before going to sleep. Pt reports being sick in December and since she has been having increased cough with excessive mucus production -- prevents pt from wearing CPAP nightly and for longer than 4 hours per night consensus that she had a viral pattern bronchitis just before Christmas, treated with Tamiflu. With persistent cough, she saw Dr VanWinkle/ allergist who diagnosed "sinuses". She then called [place] because of bothersome cough when lying down which interferes with CPAP use has blown a little blood from her nose. May be very minimal wheeze. Cough productive thick yellow to clear sputum. Right frontoparietal headache over the last week. CXR 02/09/14- IMPRESSION: No active cardiopulmonary disease. Electronically Signed  By: Ivar Drape M.D.  On: 02/09/2014 09:30  09/14/14- 89 yoF former smoker followed for bronchitis/ bronchiolitis/ nodules, hx  Ocular sarcoid, OSA complicated by GERD, glaucoma Reports: cough, prod prev w/green mucus; chest hurts from coughing; chest tightness; cough more than 2 weeks CPAP 9/ [facility]-irregular use. Wakes strangling. Mask makes her nose sore. Acute illness, 2 weeks, began with fever and chills, throat irritation/cough moved to chest. Diffuse anterior chest soreness with cough, green sputum. Got chilled during upper endoscopy last week.  ROS-see HPI Constitutional:   No-   weight loss, +night sweats, fevers, chills, fatigue, lassitude. HEENT:   +  headaches, difficulty swallowing, tooth/dental problems, sore throat,       Some  sneezing, itching, ear ache, +nasal congestion, post nasal drip,   CV:  +chest pain, no-orthopnea, PND, swelling in lower extremities, anasarca, dizziness, palpitations Resp: +  shortness of breath with exertion not at rest.              +   productive cough,  + non-productive cough,  No- coughing up of blood.                No-change in color of mucus.  + wheezing.   Skin: No-  rash or lesions. GI:  No-   heartburn, indigestion, abdominal pain, nausea, vomiting,  GU:  MS:  +  joint pain or swelling, + back pain Neuro-     nothing unusual Psych:  No- change in mood or affect. No depression or anxiety.  No memory loss.  OBJ General- Alert, Oriented, Affect anxious, Distress- none acute Skin- rash-none, lesions- none, excoriation- none Lymphadenopathy- none Head- atraumatic            Eyes- Gross vision intact, PERRLA, conjunctivae clear secretions- not injected            Ears- Hearing aid Left            Nose- + turbinate edema, no-Septal dev, mucus, polyps, erosion, perforation             Throat- Mallampati III , mucosa clear , drainage- none, tonsils- atrophic Neck- flexible , trachea midline, no stridor , thyroid nl, carotid no bruit Chest - symmetrical excursion , unlabored           Heart/CV- RRR , no murmur , no gallop  , no rub, nl s1 s2                           - JVD- none , edema- none, stasis changes- none, varices- none           Lung- clear, cough + wheezy , dullness-none, rub- none           Chest wall-  Abd-  Br/ Gen/ Rectal- Not done, not indicated Extrem- cyanosis- none, clubbing, none, atrophy- none, strength- nl.  Neuro- grossly intact to observation

## 2014-09-14 NOTE — Telephone Encounter (Signed)
error 

## 2014-09-14 NOTE — Patient Instructions (Signed)
Script for cough syrup                 You can also use cough drops  Script for Z pak

## 2014-09-17 NOTE — Assessment & Plan Note (Signed)
Acute exacerbation of chronic bronchitis. Plan-cough syrup, Z-Pak, fluids

## 2014-09-17 NOTE — Assessment & Plan Note (Signed)
Needs DME to refit her CPAP mask for comfort Plan-change to auto titration 5-15/[facility]

## 2014-09-21 ENCOUNTER — Telehealth: Payer: Self-pay | Admitting: Internal Medicine

## 2014-09-21 MED ORDER — PREDNISONE 20 MG PO TABS: ORAL_TABLET | ORAL | Status: DC

## 2014-09-21 MED ORDER — AMOXICILLIN-POT CLAVULANATE 875-125 MG PO TABS: 1.0000 | ORAL_TABLET | Freq: Two times a day (BID) | ORAL | Status: DC

## 2014-09-21 MED ORDER — HYDROCODONE-HOMATROPINE 5-1.5 MG/5ML PO SYRP: 5.0000 mL | ORAL_SOLUTION | Freq: Four times a day (QID) | ORAL | Status: DC | PRN

## 2014-09-21 NOTE — Telephone Encounter (Signed)
Question if reflux is contributring to cough.  Offer prednisone 20 mg, # 6, 2 today then one daily,             augmentin 875 mg, # 14, 1 twice daily           Script to pick up tomorrow if she wants, for hydromet cough syrup,     200 ml,  5 ml every 6 hours if needed for cough

## 2014-09-21 NOTE — Telephone Encounter (Signed)
Spoke with pt, aware of recs.  rx's sent to pharmacy, cough syrup printed and placed up front for pick up tomorrow.   Nothing further needed.

## 2014-09-21 NOTE — Telephone Encounter (Signed)
Spoke with pt, c/o "violent" prod cough with clear-yellow/green mucus, coughs until she soils herself, chills, tightness in chest.   Denies fever, chest pain.   Pt given zpak and phenergan with codeine cough syrup on last ov 09/14/14.   Has finished zpak, still taking cough syrup.    Pt uses [facility] on [address].    CY please advise on recs.  Thanks!  Allergies  Allergen Reactions  . Crestor [Rosuvastatin Calcium] Other (See Comments)    muscle aches  . Demerol [Meperidine] Other (See Comments)    Hallucinations   . Shellfish Allergy     Crab, shrimp and lobster ---lips itch and tingle   Current Outpatient Prescriptions on File Prior to Visit  Medication Sig Dispense Refill  . Albuterol Sulfate (VENTOLIN HFA IN) Inhale 2 puffs into the lungs 4 (four) times daily as needed (shortness of breath).     . ALPRAZolam (XANAX) 0.5 MG tablet Take 0.25 mg by mouth at bedtime as needed for anxiety or sleep. For sleep    . atorvastatin (LIPITOR) 20 MG tablet TAKE 1 TABLET BY MOUTH EVERY DAY 30 tablet 6  . azithromycin (ZITHROMAX Z-PAK) 250 MG tablet 2 today then one daily 6 each 0  . brimonidine (ALPHAGAN P) 0.1 % SOLN Place 1 drop into both eyes every 8 (eight) hours.    . brinzolamide (AZOPT) 1 % ophthalmic suspension Place 1 drop into both eyes 3 (three) times daily.     [name] esomeprazole (NEXIUM) 40 MG capsule Take 40 mg by mouth 2 (two) times daily as needed (reflux).     . furosemide (LASIX) 20 MG tablet Take 1 tablet (20 mg total) by mouth daily. (Patient taking differently: 1 tablet three times a week) 30 tablet 11  . JANUMET 50-500 MG per tablet Take 1 tablet by mouth Twice daily.    [name] loteprednol (LOTEMAX) 0.5 % ophthalmic suspension Place 1 drop into the left eye 2 (two) times daily.     . metoprolol (LOPRESSOR) 50 MG tablet TAKE 1 TABLET BY MOUTH 2 TIMES DAILY. 60 tablet 6  . mometasone-formoterol (DULERA) 200-5 MCG/ACT AERO Inhale 2 puffs into the lungs 2 (two) times daily. 1 Inhaler 0  .  montelukast (SINGULAIR) 10 MG tablet Take 10 mg by mouth at bedtime.    . ONE TOUCH ULTRA TEST test strip 1 each by Other route as needed.     . potassium chloride (K-DUR) 10 MEQ tablet Take 1 tablet (10 mEq total) by mouth daily. (Patient taking differently: Take 10 mEq by mouth every other day. ) 90 tablet 3  . pregabalin (LYRICA) 100 MG capsule Take 100 mg by mouth at bedtime.     . promethazine-codeine (PHENERGAN WITH CODEINE) 6.25-10 MG/5ML syrup Take 5 mLs by mouth every 4 (four) hours as needed for cough. 120 mL 0  . Travoprost (TRAVATAN OP) Place 1 drop into both eyes at bedtime.      No current facility-administered medications on file prior to visit.

## 2014-09-22 DIAGNOSIS — Z87891 Personal history of nicotine dependence: Secondary | ICD-10-CM | POA: Diagnosis not present

## 2014-09-22 DIAGNOSIS — J45901 Unspecified asthma with (acute) exacerbation: Secondary | ICD-10-CM | POA: Diagnosis not present

## 2014-09-22 DIAGNOSIS — J40 Bronchitis, not specified as acute or chronic: Secondary | ICD-10-CM | POA: Diagnosis not present

## 2014-09-22 DIAGNOSIS — R062 Wheezing: Secondary | ICD-10-CM | POA: Diagnosis not present

## 2014-10-02 ENCOUNTER — Other Ambulatory Visit: Payer: Self-pay | Admitting: Cardiovascular Disease

## 2014-10-23 DIAGNOSIS — H209 Unspecified iridocyclitis: Secondary | ICD-10-CM | POA: Diagnosis not present

## 2014-10-23 DIAGNOSIS — Z79899 Other long term (current) drug therapy: Secondary | ICD-10-CM | POA: Diagnosis not present

## 2014-10-23 DIAGNOSIS — D869 Sarcoidosis, unspecified: Secondary | ICD-10-CM | POA: Diagnosis not present

## 2014-10-23 DIAGNOSIS — M79642 Pain in left hand: Secondary | ICD-10-CM | POA: Diagnosis not present

## 2014-10-24 DIAGNOSIS — H5710 Ocular pain, unspecified eye: Secondary | ICD-10-CM | POA: Diagnosis not present

## 2014-11-20 DIAGNOSIS — H4043X3 Glaucoma secondary to eye inflammation, bilateral, severe stage: Secondary | ICD-10-CM | POA: Diagnosis not present

## 2014-11-20 DIAGNOSIS — H43813 Vitreous degeneration, bilateral: Secondary | ICD-10-CM | POA: Diagnosis not present

## 2014-11-20 DIAGNOSIS — D899 Disorder involving the immune mechanism, unspecified: Secondary | ICD-10-CM | POA: Diagnosis not present

## 2014-11-20 DIAGNOSIS — E119 Type 2 diabetes mellitus without complications: Secondary | ICD-10-CM | POA: Diagnosis not present

## 2014-11-28 ENCOUNTER — Telehealth: Payer: Self-pay | Admitting: Internal Medicine

## 2014-11-28 NOTE — Telephone Encounter (Signed)
I called spoke with pt. Offered sooner appt with another doc but preferred to see Dr. Annamaria Boots on Friday. Nothing further needed

## 2014-12-01 ENCOUNTER — Ambulatory Visit (INDEPENDENT_AMBULATORY_CARE_PROVIDER_SITE_OTHER): Payer: Medicare Other | Admitting: Internal Medicine

## 2014-12-01 ENCOUNTER — Ambulatory Visit (INDEPENDENT_AMBULATORY_CARE_PROVIDER_SITE_OTHER)
Admission: RE | Admit: 2014-12-01 | Discharge: 2014-12-01 | Disposition: A | Discharge status: Home / Self Care | Payer: Medicare Other | Source: Ambulatory Visit | Attending: Internal Medicine | Admitting: Internal Medicine

## 2014-12-01 ENCOUNTER — Encounter: Payer: Self-pay | Admitting: Internal Medicine

## 2014-12-01 ENCOUNTER — Other Ambulatory Visit (INDEPENDENT_AMBULATORY_CARE_PROVIDER_SITE_OTHER): Payer: Medicare Other

## 2014-12-01 VITALS — BP 122/78 | HR 82 | Ht 62.0 in | Wt 168.4 lb

## 2014-12-01 DIAGNOSIS — J418 Mixed simple and mucopurulent chronic bronchitis: Secondary | ICD-10-CM | POA: Diagnosis not present

## 2014-12-01 DIAGNOSIS — K219 Gastro-esophageal reflux disease without esophagitis: Secondary | ICD-10-CM

## 2014-12-01 DIAGNOSIS — J209 Acute bronchitis, unspecified: Secondary | ICD-10-CM

## 2014-12-01 DIAGNOSIS — R05 Cough: Secondary | ICD-10-CM | POA: Diagnosis not present

## 2014-12-01 DIAGNOSIS — G4733 Obstructive sleep apnea (adult) (pediatric): Secondary | ICD-10-CM

## 2014-12-01 DIAGNOSIS — D869 Sarcoidosis, unspecified: Secondary | ICD-10-CM | POA: Diagnosis not present

## 2014-12-01 DIAGNOSIS — R0602 Shortness of breath: Secondary | ICD-10-CM | POA: Diagnosis not present

## 2014-12-01 LAB — CBC WITH DIFFERENTIAL/PLATELET
BASOS ABS: 0 10*3/uL (ref 0.0–0.1)
Basophils Relative: 0.3 % (ref 0.0–3.0)
EOS ABS: 0.2 10*3/uL (ref 0.0–0.7)
EOS PCT: 2.7 % (ref 0.0–5.0)
HCT: 35.6 % — ABNORMAL LOW (ref 36.0–46.0)
HEMOGLOBIN: 11.7 g/dL — AB (ref 12.0–15.0)
LYMPHS ABS: 1.9 10*3/uL (ref 0.7–4.0)
Lymphocytes Relative: 23.9 % (ref 12.0–46.0)
MCHC: 32.9 g/dL (ref 30.0–36.0)
MCV: 83.2 fl (ref 78.0–100.0)
MONO ABS: 0.5 10*3/uL (ref 0.1–1.0)
Monocytes Relative: 7 % (ref 3.0–12.0)
NEUTROS PCT: 66.1 % (ref 43.0–77.0)
Neutro Abs: 5.1 10*3/uL (ref 1.4–7.7)
Platelets: 193 10*3/uL (ref 150.0–400.0)
RBC: 4.28 Mil/uL (ref 3.87–5.11)
RDW: 15.3 % (ref 11.5–15.5)
WBC: 7.8 10*3/uL (ref 4.0–10.5)

## 2014-12-01 MED ORDER — METHYLPREDNISOLONE ACETATE 80 MG/ML IJ SUSP: 80.0000 mg | Freq: Once | INTRAMUSCULAR | Status: DC

## 2014-12-01 MED ORDER — METHYLPREDNISOLONE ACETATE 80 MG/ML IJ SUSP
80.0000 mg | Freq: Once | INTRAMUSCULAR | Status: AC
  Administered 2014-12-01: 80 mg via INTRAMUSCULAR

## 2014-12-01 MED ORDER — AMOXICILLIN-POT CLAVULANATE 875-125 MG PO TABS: 1.0000 | ORAL_TABLET | Freq: Two times a day (BID) | ORAL | Status: DC

## 2014-12-01 MED ORDER — HYDROCOD POLST-CPM POLST ER 10-8 MG/5ML PO SUER: 5.0000 mL | Freq: Two times a day (BID) | ORAL | Status: DC | PRN

## 2014-12-01 NOTE — Progress Notes (Signed)
02/05/11- 66 yoF former smoker seen on referral from Dr Estanislado Pandy for concern of sarcoid. 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 Shortness of breath has been chronic with one flight of stairs. She thinks she may be gradually getting worse. There is chronic dry cough which is been worse since a flulike illness 3 weeks ago. 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 I can't tell a specific arthritis diagnosis has been made. There is no history of liver or kidney disease. Bronchitis was diagnosed at the time of allergy skin testing a year ago. Food sensitivity to shrimp and crab which make her lips itch. Positive skin test also for grass and house dust. She has not been on allergy vaccine. A rescue inhaler has helped at times. 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 Advair was no help and Symbicort may have helped only a little. ACE 46 (8-52) 09/30/10 Medication choices been limited by glaucoma. No history of TB exposure. Past history of sinus infections. Active problem with heartburn which is partly suppressed with Nexium once or twice daily/ Dr Collene Mares. Married, now on short-term disability. Quit smoking 1980 Several family members on her father's side had sarcoid.  03/10/11- 66 yoF former smoker followed for bronchitis, hx sarcoid, complicated by GERD, glaucoma Dulera inhaler did not have significant improvement. She is off of methotrexate and prednisone as of this month. 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 She occasionally wakes coughing with thick white mucus, stress incontinence. Just occasionally tight. DOE with one flight of stairs and has to stop at the top to get her breath but  no longer has to catch her breath halfway down stairs carrying laundry. Tussionex no longer works as well for cough as it used to.  07/08/11- 65 yoF former smoker followed for bronchitis, hx sarcoid, complicated by GERD, glaucoma Iincreased cough and congestion(nasal and chest)Productive-green/yellow in color x 3 days. Denies any fever but has been having chills. SOB and wheezing along with cough. SOB is all the time. Need to check if okay to start back on Methtrexate again. Dr Estanislado Pandy has been following for sarcoid of the eye and wants to resume methotrexate. There has been increased cough, productive of  mucus, worse at night and supine. Maybe some chills. Stress incontinence. Arthralgias. Postnasal drip. GERD is fairly well controlled with Nexium. Taking tramadol now for pain so is not helping the cough. Failed benzonatate. This was a prolonged visit 40 minutes. CXR 02/09/11-images reviewed with her. IMPRESSION:  Stable examination. No active cardiopulmonary process.  Original Report Authenticated By: Vivia Ewing, M.D.    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  Will see Dr.Deveshwar next month for decision about restarting methotrexate. Persistent pain mid anterior chest through to back, worse with prolonged standing and kitchen, eased by leaning over. No reflux or dysphagia. Expects seasonal bronchitis but denies routine cough or dyspnea, fever or sweats. CXR 07/10/11- negative. ACE 07/08/11- 40/ WNL.  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   FOLLOWS FOR: still having problems with SOB with activity. No change in complaints of pain, mid anterior chest, left greater than right mostly around into her back and with prolonged standing she has to stop kitchen work to sit down. We discussed this as more of a back problem and a chest problem. She has seen Dr.Van Creed Copper w/ [facility] . Dr. Estanislado Pandy is treating with ibuprofen.  07/08/12- 85 yoF former smoker followed for bronchitis, hx sarcoid, complicated by GERD, glaucoma FOLLOWS FOR: went for ROV at [facility]-was told allergies were flared up and Deveshwar MD stated Sarcoid was flared up as well. Was given Rx's(currently on Prednisone taper) continues to cough-green and yellow with streaks of blood at times. (Dr Creed Copper) Complains of backache. Rhinitis is responded to nasal spray, Singulair, Allegra. Dr.Deveshwar feels for sarcoid might be flaring. She prescribed prednisone, methotrexate, folic acid. ACE level 07/08/11- 40 (8-52) CT chest 05/19/12 IMPRESSION:  . No explanation for the patient's history of back pain.  2. Despite the reported clinical history of sarcoidosis, there are  no imaging findings to suggest a diagnosis of sarcoidosis at this  time in this patient. Clinical correlation is recommended.  3. Mild centrilobular emphysema.  4. Atherosclerosis, including left anterior descending coronary  artery disease. Please note that although the presence of coronary  artery calcium documents the presence of coronary artery disease,  the severity of this disease and any potential stenosis cannot be  assessed on this non-gated CT examination. Assessment for  potential risk factor modification, dietary therapy or  pharmacologic therapy may be warranted, if clinically indicated.  5. Small pulmonary nodules in the lungs, as above. The largest of  these is a 6 mm ground-glass attenuation nodule in the right upper  lobe (image 16 of series 3). Initial follow-up by  chest CT without  contrast is recommended in 3 months to confirm persistence. This  recommendation follows the consensus statement: Recommendations for  the Management of Subsolid Pulmonary Nodules Detected at CT: A  Statement from the [employer] as published in Radiology  2013; 266:304-317.  Original Report Authenticated By: Vinnie Langton, M.D.  08/19/12- 70 yoF former smoker followed for bronchitis, hx sarcoid, complicated by GERD, glaucoma follows for:  discuss ct results  Occasional sweats, minor rash at right ankle, cough productive white, thick and occasionally yellow sputum. Some dyspnea with exertion, no chest pain or palpitation. ACE 07/08/12- 27 (8-52) CT chest 08/18/12 IMPRESSION:  1. New clustered peribronchovascular ill-defined nodularity in the  right upper lobe, indicative of an infectious bronchiolitis.  2. Previously measured 6 mm right upper lobe nodule is less  discrete on the current examination.  3. Coronary artery calcification.  Original Report Authenticated By: Lorin Picket, M.D.  02/25/13- 15 yoF former smoker followed for bronchitis/ bronchiolitis/ nodules, hx  Ocular sarcoid, complicated by GERD, glaucoma FOLLOWS FOR: review PFT, 6MW, CT Scan, and sputum cultures with patient. Pt states that she continues to have SOB with activitiy; when not active-she feels like she cant breathe Occasional cough, scant clear sputum, better with Dulera. Pains around chest, chronic, if she forces a deep breath. PFT 09/21/2012-mild obstructive airways disease with response to bronchodilator, moderate restriction, diffusion severely reduced. FVC 1.88/82%, FEV11.58/88%, FEV1/FVC 0.84, FEF 25-75% 1.86/106% with some response to bronchodilator in small airways. TLC 62%, DLCO 49%. 6MWT 09/21/12- WNL 97%, 97%, 98%, 381 m. Sputum culture-normal  flora, negative for AFB and fungus  08/25/13- 68 yoF former smoker followed for bronchitis/ bronchiolitis/ nodules, hx  Ocular sarcoid,  complicated by GERD, glaucoma FOLLOWS FOR: not using routine inhalers as she should; pt has good and bad days with breathing. Pt states her husabnd has noticed she has started snoring and gasping for air-has to be rolled over or get up and catch her breath. She was also told she stops breathing in her sleep and has noticed this during the day as well. CXR 6/ 4 /15 IMPRESSION:  No active cardiopulmonary disease.  Electronically Signed  By: Dereck Ligas M.D.  On: 07/14/2013 11:45  11/01/13-68 yoF former smoker followed for bronchitis/ bronchiolitis/ nodules, hx  Ocular sarcoid, OSA complicated by GERD, glaucoma FOLLOWS FOR: Pt c/o increased SOB with exertion, prod cough with clear to yellow mucus.  Review sleep study.  NPSG-10/18/13- Mild OSA, AHI 9.3/ hr, weight 164 lbs  To start CPAP auto  05/03/14- 68 yoF former smoker followed for bronchitis/ bronchiolitis/ nodules, hx  Ocular sarcoid, OSA complicated by GERD, glaucoma FOLLOWS FOR: Pt reports using CPAP x 4 nights a week when increasd cough is not present before going to sleep. Pt reports being sick in December and since she has been having increased cough with excessive mucus production -- prevents pt from wearing CPAP nightly and for longer than 4 hours per night consensus that she had a viral pattern bronchitis just before Christmas, treated with Tamiflu. With persistent cough, she saw Dr VanWinkle/ allergist who diagnosed "sinuses". She then called [place] because of bothersome cough when lying down which interferes with CPAP use has blown a little blood from her nose. May be very minimal wheeze. Cough productive thick yellow to clear sputum. Right frontoparietal headache over the last week. CXR 02/09/14- IMPRESSION: No active cardiopulmonary disease. Electronically Signed  By: Ivar Drape M.D.  On: 02/09/2014 09:30  09/14/14- 22 yoF former smoker followed for bronchitis/ bronchiolitis/ nodules, hx  Ocular sarcoid, OSA complicated by GERD,  glaucoma Reports: cough, prod prev w/green mucus; chest hurts from coughing; chest tightness; cough more than 2 weeks CPAP 9/ [facility]-irregular use. Wakes strangling. Mask makes her nose sore. Acute illness, 2 weeks, began with fever and chills, throat irritation/cough moved to chest. Diffuse anterior chest soreness with cough, green sputum. Got chilled during upper endoscopy last week.  12/01/14- 69 yoF former smoker followed for bronchitis/ bronchiolitis/ nodules, hx  Ocular sarcoid, OSA complicated by GERD, glaucoma CPAP 9/ [facility]-noncompliant Follows for: Acute visit. Pt c/o mostly wet cough, brown/tan mucus, post-tussive emesis, wheezing and SOB. Symptoms have been present for 2 weeks. Pt has been using albuterol HFA 4 times daily with some relief.  Denies fever or chills. An acute bronchitis during the summer resolved with Zithromax and prednisone and she was well until this new problem. Aware of GERD symptoms despite Nexium.  ROS-see HPI Constitutional:   No-   weight loss, night sweats, fevers, chills, fatigue, lassitude. HEENT:   +  headaches, difficulty swallowing, tooth/dental problems, sore throat,       Some  sneezing, itching, ear ache, +nasal congestion, post nasal drip,  CV:  chest pain, no-orthopnea, PND, swelling in lower extremities, anasarca, dizziness, palpitations Resp: +  shortness of breath with exertion not at rest.              +   productive cough,  + non-productive cough,  No- coughing up of blood.                +  in color of mucus.  + wheezing.   Skin: No-   rash or lesions. GI:  +  heartburn, indigestion, abdominal pain, nausea, vomiting,  GU:  MS:  +  joint pain or swelling, + back pain Neuro-     nothing unusual Psych:  No- change in mood or affect. No depression or anxiety.  No memory loss.  OBJ General- Alert, Oriented, Affect anxious, Distress- none acute Skin- rash-none, lesions- none, excoriation- none Lymphadenopathy- none Head- atraumatic             Eyes- Gross vision intact, PERRLA, conjunctivae clear secretions- not injected            Ears- Hearing aid Left            Nose- + turbinate edema, no-Septal dev, mucus, polyps, erosion, perforation             Throat- Mallampati III , mucosa clear , drainage- none, tonsils- atrophic Neck- flexible , trachea midline, no stridor , thyroid nl, carotid no bruit Chest - symmetrical excursion , unlabored           Heart/CV- RRR , no murmur , no gallop  , no rub, nl s1 s2                           - JVD- none , edema- none, stasis changes- none, varices- none           Lung- clear, cough-none, wheeze + mild, dullness-none, rub- none           Chest wall-  Abd-  Br/ Gen/ Rectal- Not done, not indicated Extrem- cyanosis- none, clubbing, none, atrophy- none, strength- nl.  Neuro- grossly intact to observation

## 2014-12-01 NOTE — Assessment & Plan Note (Signed)
We discussed potential for reflux to trigger episodic bronchitis from aspiration. Emphasis on reflux precautions.

## 2014-12-01 NOTE — Assessment & Plan Note (Signed)
Download shows inadequate compliance. Once the acute illness resolves we will discuss goals and options.

## 2014-12-01 NOTE — Patient Instructions (Addendum)
Order- CXR  Dx acute bronchitis, sarcoid              Lab- CBC w diff   Script for tusionex cough syrup     Script for augmentin antibiotic  Depo 80  We can keep appointment for February. Please call as needed.

## 2014-12-01 NOTE — Assessment & Plan Note (Signed)
We had not seen evidence of progressive systemic sarcoid, plan-chest x-ray

## 2014-12-01 NOTE — Assessment & Plan Note (Signed)
Acute exacerbation, probably infection although aspiration is a possibility. Because of brown sputum described, we will give Augmentin. She says codeine cough syrup doesn't last long enough.  Plan-chest x-ray, Augmentin, Tussionex

## 2014-12-04 ENCOUNTER — Telehealth: Payer: Self-pay | Admitting: Internal Medicine

## 2014-12-04 DIAGNOSIS — H5710 Ocular pain, unspecified eye: Secondary | ICD-10-CM | POA: Diagnosis not present

## 2014-12-04 DIAGNOSIS — D869 Sarcoidosis, unspecified: Secondary | ICD-10-CM | POA: Diagnosis not present

## 2014-12-04 DIAGNOSIS — H4043X3 Glaucoma secondary to eye inflammation, bilateral, severe stage: Secondary | ICD-10-CM | POA: Diagnosis not present

## 2014-12-04 NOTE — Progress Notes (Signed)
Quick Note:  ATC pt. VM box is full. WCB. ______

## 2014-12-04 NOTE — Telephone Encounter (Signed)
Patient notified.  Nothing further needed. 

## 2014-12-04 NOTE — Telephone Encounter (Signed)
Patient returned call and may be reached at [phone].

## 2014-12-04 NOTE — Telephone Encounter (Signed)
Notes Recorded by Deneise Lever, MD on 12/01/2014 at 4:18 PM CBC- very slight anemia. WBC is ok Notes Recorded by Deneise Lever, MD on 12/01/2014 at 3:33 PM CXR is clear. There is no pneumonia and no change related to history of sarcoid. We are treating for bronchitis, which may not show on cxr ---  ATC PT, NA, VM full, WCB

## 2014-12-04 NOTE — Progress Notes (Signed)
Quick Note:  ATC pt. VM box is full. WCB ______

## 2014-12-11 ENCOUNTER — Encounter: Payer: Self-pay | Admitting: Internal Medicine

## 2014-12-11 DIAGNOSIS — I10 Essential (primary) hypertension: Secondary | ICD-10-CM | POA: Diagnosis not present

## 2014-12-11 DIAGNOSIS — I2510 Atherosclerotic heart disease of native coronary artery without angina pectoris: Secondary | ICD-10-CM | POA: Diagnosis not present

## 2014-12-11 DIAGNOSIS — R413 Other amnesia: Secondary | ICD-10-CM | POA: Diagnosis not present

## 2014-12-11 DIAGNOSIS — Z79899 Other long term (current) drug therapy: Secondary | ICD-10-CM | POA: Diagnosis not present

## 2014-12-11 DIAGNOSIS — D519 Vitamin B12 deficiency anemia, unspecified: Secondary | ICD-10-CM | POA: Diagnosis not present

## 2014-12-11 DIAGNOSIS — R609 Edema, unspecified: Secondary | ICD-10-CM | POA: Diagnosis not present

## 2014-12-11 DIAGNOSIS — J45901 Unspecified asthma with (acute) exacerbation: Secondary | ICD-10-CM | POA: Diagnosis not present

## 2014-12-11 DIAGNOSIS — E1165 Type 2 diabetes mellitus with hyperglycemia: Secondary | ICD-10-CM | POA: Diagnosis not present

## 2014-12-11 DIAGNOSIS — I119 Hypertensive heart disease without heart failure: Secondary | ICD-10-CM | POA: Diagnosis not present

## 2014-12-11 DIAGNOSIS — Z0000 Encounter for general adult medical examination without abnormal findings: Secondary | ICD-10-CM | POA: Diagnosis not present

## 2014-12-11 DIAGNOSIS — J209 Acute bronchitis, unspecified: Secondary | ICD-10-CM | POA: Diagnosis not present

## 2014-12-15 DIAGNOSIS — I119 Hypertensive heart disease without heart failure: Secondary | ICD-10-CM | POA: Diagnosis not present

## 2014-12-15 DIAGNOSIS — Z8249 Family history of ischemic heart disease and other diseases of the circulatory system: Secondary | ICD-10-CM | POA: Diagnosis not present

## 2014-12-15 DIAGNOSIS — Z7982 Long term (current) use of aspirin: Secondary | ICD-10-CM | POA: Diagnosis not present

## 2014-12-15 DIAGNOSIS — I2510 Atherosclerotic heart disease of native coronary artery without angina pectoris: Secondary | ICD-10-CM | POA: Diagnosis not present

## 2015-01-06 ENCOUNTER — Other Ambulatory Visit: Payer: Self-pay | Admitting: Cardiovascular Disease

## 2015-01-10 ENCOUNTER — Telehealth: Payer: Self-pay | Admitting: Cardiovascular Disease

## 2015-01-10 NOTE — Telephone Encounter (Signed)
Agree with note by Katy Kemp, RN 

## 2015-01-10 NOTE — Telephone Encounter (Signed)
Patient called c/o worsening fluttering, palpitations, and heart racing for 3 days.  Last night, she had an episode so bad the palpitations woke her up.  She sat up for an hour. The palpitations "calmed down" enough to let her go back to sleep, but never stopped. The patient st her heart feels like it is racing and fluttering now. She is otherwise asymptomatic at this time. Right now, BP = 189/95 and HR = 69 She has not taken her medications yet this AM. Instructed her to self administer.  She did mention she had some intermittent mild CP/discomfort over Thanksgiving holiday, but blamed it on stress.   Patient st she started taking Benicar 20 mg daily. Med list updated.

## 2015-01-10 NOTE — Telephone Encounter (Signed)
Patient c/o Palpitations:  High priority if patient c/o lightheadedness and shortness of breath.  1. How long have you been having palpitations? Over 3 days  2. Are you currently experiencing lightheadedness and shortness of breath? Yes  3. Have you checked your BP and heart rate? (document readings) no  4. Are you experiencing any other symptoms? no

## 2015-01-10 NOTE — Progress Notes (Signed)
Cardiology Office Note   Date:  01/11/2015   ID:  Sheila, Reynolds April 08, 1945, MRN [id number]   Patient Care Team: Glendale Chard, MD as PCP - General (Internal Medicine) Thayer Headings, MD as Consulting Physician (Cardiology)    Chief Complaint  Patient presents with  . High Blood Pressure  . Palpitations     History of Present Illness: Sheila Reynolds is a 69 y.o. female with a hx of nonobstructive CAD, HL, NSVT, palpitations, sarcoidosis.  PFTs in 09/2012 demonstrated mild obstructive airway disease with response to bronchodilator, moderate restriction and severely reduced diffusion capacity. Her 6 minute walk test was normal with normal oxygen saturation. Event monitor in 3/15 demonstrated occasional PVCs but no VT. Beta blocker has been used to control palpitations. Last seen by Dr. Acie Fredrickson 4/16. Patient called in 11/30 with complaints of fluttering and palpitations blood pressure was noted to be 189/95. She was given an extra half dose of metoprolol and follow-up arranged today.  Patient notes increasing palpitations over the past couple of weeks. They sometimes awaken her from sleep. She feels as though she needs to cough. She describes symptoms that sound like she feels a skipping sensation. There are no activities that necessarily bring on palpitations. She does note some left thoracic pain that radiates to her left chest with prolonged standing. She also notes exertional shortness of breath. This is fairly chronic but seems to be getting worse. She also notes some tightness in her chest associated with this. She denies any radiating symptoms. She sleeps on an incline chronically. She sleeps with CPAP. She denies edema. She denies syncope. She's been treated with antibiotics and inhalers over the past couple of months for episodes of bronchitis. She denies any bleeding symptoms.    Studies/Reports Reviewed Today:  Echo 08/01/14 EF 55-60%, normal wall motion, grade 1  diastolic dysfunction, normal RV function, trivial effusion  Event Monitor 3/15 NSR, occ PVC, no NSVT  Echo 04/04/13 Mild focal basal septal hypertrophy, EF 60-65%  Lexiscan Myoview (09/02/12):  No ischemia, EF 71%, normal study.  Event monitor (02/2012):  NSR, PAC/PVCs.  Echocardiogram (09/2010):  EF 60-65%.  LHC (04/2007):  Proximal LAD 40, proximal D1 30-40, EF 65-70%.   Past Medical History  Diagnosis Date  . Hyperlipidemia   . Chest pain   . Nonsustained ventricular tachycardia ([place])   . Fibromyalgia   . Coronary artery disease     Mild to moderate CAD  . Palpitations   . Hx of echocardiogram     Echo (03/2013):  Tech limited; Mild focal basal septal hypertrophy, EF 60-65%, normal RVF  . Family history of anesthesia complication     daughter N/V  . Heart murmur   . Dysrhythmia     has fluttering at times  . Shortness of breath     loses breathe at times and have to take extra breathes  . Diabetes mellitus without complication ([place])   . Hypertension   . Bronchitis with emphysema   . Anxiety     on meds  . GERD (gastroesophageal reflux disease)     on meds  . Headache(784.0)   . Anemia     3 months ago anemic    Past Surgical History  Procedure Laterality Date  . Cardiac catheterization  05/04/2007    reveals overall normal left ventricular systolic function. Ejection fraction 65-70%  . Total abdominal hysterectomy  1982  . Breast surgery  approx 1992    bilateral breast surgery  for fibrocystic breast disease  . Tonsillectomy    . Colonoscopy w/ biopsies and polypectomy    . Cataract extraction w/phaco Right 07/20/2013    Procedure: CATARACT EXTRACTION PHACO AND INTRAOCULAR LENS PLACEMENT (IOC);  Surgeon: Marylynn Pearson, MD;  Location: [facility];  Service: Ophthalmology;  Laterality: Right;  . Mini shunt insertion Right 07/20/2013    Procedure: INSERTION OF GLAUCOMA FILTRATION DEVICE RIGHT EYE;  Surgeon: Marylynn Pearson, MD;  Location: [facility];  Service: Ophthalmology;   Laterality: Right;  . Mitomycin c application Right [id number]    Procedure: MITOMYCIN C APPLICATION;  Surgeon: Marylynn Pearson, MD;  Location: [facility];  Service: Ophthalmology;  Laterality: Right;  . Esophagogastroduodenoscopy (egd) with propofol N/A 09/07/2014    Procedure: ESOPHAGOGASTRODUODENOSCOPY (EGD) WITH PROPOFOL;  Surgeon: Juanita Craver, MD;  Location: [facility];  Service: Endoscopy;  Laterality: N/A;  . Colonoscopy with propofol N/A 09/07/2014    Procedure: COLONOSCOPY WITH PROPOFOL;  Surgeon: Juanita Craver, MD;  Location: [facility];  Service: Endoscopy;  Laterality: N/A;     Current Outpatient Prescriptions  Medication Sig Dispense Refill  . albuterol (PROVENTIL) (2.5 MG/3ML) 0.083% nebulizer solution INHALE 2 ML(S) BY NEBULIZATION 3 TIMES A DAY AS NEEDED FOR EXACERBATIONS  1  . ALPRAZolam (XANAX) 0.5 MG tablet Take 0.25 mg by mouth at bedtime as needed for anxiety or sleep. For sleep    . amoxicillin-clavulanate (AUGMENTIN) 875-125 MG tablet Take 1 tablet by mouth 2 (two) times daily. 14 tablet 0  . atorvastatin (LIPITOR) 20 MG tablet TAKE 1 TABLET BY MOUTH EVERY DAY 30 tablet 11  . brimonidine (ALPHAGAN P) 0.1 % SOLN Place 1 drop into both eyes every 8 (eight) hours.    . brinzolamide (AZOPT) 1 % ophthalmic suspension Place 1 drop into both eyes 3 (three) times daily.     . chlorpheniramine-HYDROcodone (TUSSIONEX PENNKINETIC ER) 10-8 MG/5ML SUER Take 5 mLs by mouth every 12 (twelve) hours as needed for cough. 140 mL 0  . esomeprazole (NEXIUM) 40 MG capsule Take 40 mg by mouth 2 (two) times daily as needed (reflux).     . furosemide (LASIX) 20 MG tablet Take 1 tablet (20 mg total) by mouth daily. (Patient taking differently: 1 tablet three times a week) 30 tablet 11  . JANUMET 50-500 MG per tablet Take 1 tablet by mouth Twice daily.    [name] loteprednol (LOTEMAX) 0.5 % ophthalmic suspension Place 1 drop into the left eye 2 (two) times daily.     . metoprolol (LOPRESSOR) 50 MG tablet TAKE 1  TABLET BY MOUTH 2 TIMES DAILY. 60 tablet 11  . mometasone-formoterol (DULERA) 200-5 MCG/ACT AERO Inhale 2 puffs into the lungs 2 (two) times daily. 1 Inhaler 0  . montelukast (SINGULAIR) 10 MG tablet Take 10 mg by mouth at bedtime.    [name] olmesartan (BENICAR) 20 MG tablet Take 20 mg by mouth daily.    . ONE TOUCH ULTRA TEST test strip 1 each by Other route as needed.     . pregabalin (LYRICA) 100 MG capsule Take 100 mg by mouth at bedtime.     [name] PROAIR HFA 108 (90 BASE) MCG/ACT inhaler Inhale 2 puffs into the lungs every 6 (six) hours as needed.  2  . Travoprost (TRAVATAN OP) Place 1 drop into both eyes at bedtime.      No current facility-administered medications for this visit.    Allergies:   Crestor; Demerol; and Shellfish allergy    Social History:   Social History  Social History  . Marital Status: Married    Spouse Name: N/A  . Number of Children: 2  . Years of Education: N/A   Occupational History  . unemployed    Social History Main Topics  . Smoking status: Former Smoker -- 1.00 packs/day for 4 years    Types: Cigarettes    Quit date: 02/11/1980  . Smokeless tobacco: Never Used  . Alcohol Use: No  . Drug Use: No  . Sexual Activity: Not Asked   Other Topics Concern  . None   Social History Narrative     Family History:   Family History  Problem Relation Age of Onset  . Heart disease Father   . Heart disease Paternal Grandmother   . Cancer Mother     unknown type, ?lung  . Cancer Paternal Grandmother     unknown type  . Diabetes Father   . Diabetes Brother   . Glaucoma Father   . Glaucoma Brother       ROS:   Please see the history of present illness.   Review of Systems  Constitution: Positive for malaise/fatigue.  HENT: Positive for headaches.   Cardiovascular: Positive for dyspnea on exertion and irregular heartbeat.  Respiratory: Positive for cough and wheezing.   Musculoskeletal: Positive for back pain.      PHYSICAL EXAM: VS:  BP  142/72 mmHg  Pulse 67  Ht 5\' 2"  (1.575 m)  Wt 167 lb (75.751 kg)  BMI 30.54 kg/m2    Wt Readings from Last 3 Encounters:  01/11/15 167 lb (75.751 kg)  12/01/14 168 lb 6.4 oz (76.386 kg)  09/14/14 167 lb (75.751 kg)     GEN: Well nourished, well developed, in no acute distress HEENT: normal Neck: no JVD, no carotid bruits, no masses Cardiac:  Normal S1/S2, RRR; no murmur ,  no rubs or gallops, no edema   Respiratory:  clear to auscultation bilaterally, no wheezing, rhonchi or rales. GI: soft, nontender, nondistended, + BS MS: no deformity or atrophy Skin: warm and dry  Neuro:  CNs II-XII intact, Strength and sensation are intact Psych: Normal affect   EKG:  EKG is ordered today.  It demonstrates:   NSR, HR 67, normal axis, QTC 412 ms   Recent Labs: 05/24/2014: ALT 17; BUN 19; Creatinine, Ser 0.79; Potassium 4.6; Sodium 135 12/01/2014: Hemoglobin 11.7*; Platelets 193.0    Lipid Panel    Component Value Date/Time   CHOL 157 05/24/2014 1650   TRIG 104.0 05/24/2014 1650   HDL 51.70 05/24/2014 1650   CHOLHDL 3 05/24/2014 1650   VLDL 20.8 05/24/2014 1650   LDLCALC 85 05/24/2014 1650      ASSESSMENT AND PLAN:  1. Palpitations:  Palpitations are somewhat similar to previous symptoms but worse. We have documented PVCs in the past. She does have a history of nonsustained ventricular tachycardia. She remains a beta blocker therapy. She was placed on potassium last year to see if this would help her palpitations. However, she stopped taking the potassium capsule about a month ago because it was hard to swallow. Her ECG today is normal. I suspect she is still feeling PVCs. She may be more sensitive to lower levels of potassium.  -  Stop K+  -  Start spironolactone 12.5 mg daily  -  BMET, magnesium, TSH  -  48 hour Holter  2. History of NSVT:   Continue beta blocker. Obtain 48 hour Holter as noted  3. CAD: Nonobstructive CAD by cardiac catheterization in  2009. Nonischemic  Myoview in 2014.  She notes increasing chest discomfort and shortness of breath. She carries a history of questionable sarcoid. She has some back pain that sounds musculoskeletal. It has been 2 years since her last assessment for ischemia. She does have significant risk factors for CAD. I will obtain an exercise Myoview. Check BNP today.  4. HTN:  Pressure has been quite elevated recently at home. She has a headache as well. Yesterday her blood pressure was 199/102. Blood pressure is much better in the office today after taking metoprolol. Start spironolactone as noted. I have asked her to increase her metoprolol tartrate to 75 mg twice a day if her blood pressure remains elevated over the next couple of days. BMET in 1 week.  5. Hyperlipidemia:  Continue statin.  6. Sarcoidosis: She is followed chronically by pulmonology (Dr. Annamaria Boots).  7. Headache: She may take Tylenol prn. This may be related to her blood pressure versus sinus congestion. Hopefully it will improve with better BP control.   8. Diastolic Dysfunction:  No evidence of volume overload. But she is more short of breath. Check BNP. If high, will consider resuming Lasix QD.     Medication Changes: Current medicines are reviewed at length with the patient today.  Concerns regarding medicines are as outlined above.  The following changes have been made:   Discontinued Medications   POTASSIUM CHLORIDE (K-DUR) 10 MEQ TABLET    Take 1 tablet (10 mEq total) by mouth daily.   Modified Medications   No medications on file   New Prescriptions   No medications on file   Labs/ tests ordered today include:   No orders of the defined types were placed in this encounter.     Disposition:    FU with me or Dr. Liam Rogers 3-4 weeks.     Signed, Versie Starks, MHS 01/11/2015 3:14 PM    [facility] [address], [place], [place]  [address] Phone: [phone]; Fax: [phone]

## 2015-01-10 NOTE — Telephone Encounter (Signed)
F/u    Pt calling back stating that she still hasn't heard back from the nurse she spoke to this morning. Pt states her BP is now 172/103. Please call back and advise.

## 2015-01-10 NOTE — Telephone Encounter (Signed)
BP=173/103 and HR 67 a couple hours after taking medications.  Per Lyda Jester, PA, instructed patient to take an extra 1/2 dose Metoprolol now and schedule appointment with APP. Instructed patient to limit salt and caffeine. Patient st she has FU OV with PCP regarding Benicar.  Scheduled patient tomorrow, 12/1, with Richardson Dopp for evaluation. Patient agrees with treatment plan.

## 2015-01-11 ENCOUNTER — Encounter: Payer: Self-pay | Admitting: Physician Assistant

## 2015-01-11 ENCOUNTER — Ambulatory Visit (INDEPENDENT_AMBULATORY_CARE_PROVIDER_SITE_OTHER): Payer: Medicare Other | Admitting: Physician Assistant

## 2015-01-11 VITALS — BP 142/72 | HR 67 | Ht 62.0 in | Wt 167.0 lb

## 2015-01-11 DIAGNOSIS — R079 Chest pain, unspecified: Secondary | ICD-10-CM

## 2015-01-11 DIAGNOSIS — I10 Essential (primary) hypertension: Secondary | ICD-10-CM | POA: Diagnosis not present

## 2015-01-11 DIAGNOSIS — D869 Sarcoidosis, unspecified: Secondary | ICD-10-CM

## 2015-01-11 DIAGNOSIS — I472 Ventricular tachycardia: Secondary | ICD-10-CM | POA: Diagnosis not present

## 2015-01-11 DIAGNOSIS — R002 Palpitations: Secondary | ICD-10-CM | POA: Diagnosis not present

## 2015-01-11 DIAGNOSIS — R0602 Shortness of breath: Secondary | ICD-10-CM | POA: Diagnosis not present

## 2015-01-11 DIAGNOSIS — I2510 Atherosclerotic heart disease of native coronary artery without angina pectoris: Secondary | ICD-10-CM

## 2015-01-11 DIAGNOSIS — E785 Hyperlipidemia, unspecified: Secondary | ICD-10-CM

## 2015-01-11 DIAGNOSIS — I4729 Other ventricular tachycardia: Secondary | ICD-10-CM

## 2015-01-11 MED ORDER — SPIRONOLACTONE 25 MG PO TABS: 12.5000 mg | ORAL_TABLET | Freq: Every day | ORAL | Status: DC

## 2015-01-11 NOTE — Patient Instructions (Signed)
Medication Instructions:  1. STOP POTASSIUM  2. START SPIRONOLACTONE 25 MG TABLET WITH DIRECTIONS READING TO TAKE 1/2 TAB DAILY = 12.5 MG DAILY  3. CHANGE LASIX TO TAKING ONLY AS NEEDED FOR SWELLING; IF YOU DO TAKE THE LASIX MAKE SURE TO EAT A BANANA THAT DAY AS WELL  Labwork: 1. TODAY BMET, MAGNESIUM LEVEL, TSH, BNP  2. REPEAT BMET IN 1 WEEK  Testing/Procedures: 1. Your physician has recommended that you wear a 48 HOUR holter monitor. Holter monitors are medical devices that record the heart's electrical activity. Doctors most often use these monitors to diagnose arrhythmias. Arrhythmias are problems with the speed or rhythm of the heartbeat. The monitor is a small, portable device. You can wear one while you do your normal daily activities. This is usually used to diagnose what is causing palpitations/syncope (passing out).  2. Your physician has requested that you have en exercise stress myoview. For further information please visit [email or web address]. Please follow instruction sheet, as given.  Follow-Up: 3-4 WEEKS WITH SCOTT WEAVER, PAC SAME DAY DR. Acie Fredrickson IS IN THE OFFICE  Any Other Special Instructions Will Be Listed Below (If Applicable). IF BP REMAINS 160/100 OR ABOVE AT HOME FOR A 2-3 DAYS IN A ROW THEN INCREASE METOPROLOL TO 75 MG TWICE DAILY; IF YOU DO HAVE TO INCREASE THE MEDICATION PLEASE CALL THE OFFICE [phone] TO INFORM SCOTT WEAVER, PAC     If you need a refill on your cardiac medications before your next appointment, please call your pharmacy.

## 2015-01-11 NOTE — Telephone Encounter (Signed)
See OV note from today Richardson Dopp, PA-C   01/11/2015 5:46 PM

## 2015-01-12 ENCOUNTER — Telehealth: Payer: Self-pay | Admitting: *Deleted

## 2015-01-12 ENCOUNTER — Ambulatory Visit (INDEPENDENT_AMBULATORY_CARE_PROVIDER_SITE_OTHER): Payer: Medicare Other

## 2015-01-12 DIAGNOSIS — I4729 Other ventricular tachycardia: Secondary | ICD-10-CM

## 2015-01-12 DIAGNOSIS — R002 Palpitations: Secondary | ICD-10-CM | POA: Diagnosis not present

## 2015-01-12 DIAGNOSIS — I472 Ventricular tachycardia: Secondary | ICD-10-CM | POA: Diagnosis not present

## 2015-01-12 LAB — BASIC METABOLIC PANEL
BUN: 12 mg/dL (ref 7–25)
CALCIUM: 9.1 mg/dL (ref 8.6–10.4)
CO2: 28 mmol/L (ref 20–31)
Chloride: 102 mmol/L (ref 98–110)
Creat: 0.67 mg/dL (ref 0.50–0.99)
GLUCOSE: 66 mg/dL (ref 65–99)
POTASSIUM: 4 mmol/L (ref 3.5–5.3)
SODIUM: 139 mmol/L (ref 135–146)

## 2015-01-12 LAB — MAGNESIUM: MAGNESIUM: 1.6 mg/dL (ref 1.5–2.5)

## 2015-01-12 LAB — BRAIN NATRIURETIC PEPTIDE: BRAIN NATRIURETIC PEPTIDE: 78.3 pg/mL (ref 0.0–100.0)

## 2015-01-12 LAB — TSH: TSH: 1.019 u[IU]/mL (ref 0.350–4.500)

## 2015-01-12 NOTE — Telephone Encounter (Signed)
Pt notified of lab results ok and to try increaseing foods in her diet that are rich in magnesium. I gave her food expamples, dark leafy greens, nuts, banana's, citrus fruit, tomatoes. Repeat labs 12/9. Pt said ok and thank you.

## 2015-01-18 DIAGNOSIS — I2510 Atherosclerotic heart disease of native coronary artery without angina pectoris: Secondary | ICD-10-CM | POA: Diagnosis not present

## 2015-01-18 DIAGNOSIS — I119 Hypertensive heart disease without heart failure: Secondary | ICD-10-CM | POA: Diagnosis not present

## 2015-01-18 DIAGNOSIS — Z87891 Personal history of nicotine dependence: Secondary | ICD-10-CM | POA: Diagnosis not present

## 2015-01-18 DIAGNOSIS — Z79899 Other long term (current) drug therapy: Secondary | ICD-10-CM | POA: Diagnosis not present

## 2015-01-19 ENCOUNTER — Other Ambulatory Visit (INDEPENDENT_AMBULATORY_CARE_PROVIDER_SITE_OTHER): Payer: Medicare Other | Admitting: *Deleted

## 2015-01-19 ENCOUNTER — Telehealth: Payer: Self-pay | Admitting: *Deleted

## 2015-01-19 DIAGNOSIS — I10 Essential (primary) hypertension: Secondary | ICD-10-CM | POA: Diagnosis not present

## 2015-01-19 LAB — BASIC METABOLIC PANEL
BUN: 12 mg/dL (ref 7–25)
CHLORIDE: 103 mmol/L (ref 98–110)
CO2: 24 mmol/L (ref 20–31)
Calcium: 9.2 mg/dL (ref 8.6–10.4)
Creat: 0.74 mg/dL (ref 0.50–0.99)
Glucose, Bld: 119 mg/dL — ABNORMAL HIGH (ref 65–99)
POTASSIUM: 4 mmol/L (ref 3.5–5.3)
Sodium: 138 mmol/L (ref 135–146)

## 2015-01-19 NOTE — Telephone Encounter (Signed)
No answer I will try again later.

## 2015-01-19 NOTE — Telephone Encounter (Signed)
Pt notified of lab results by phone with verbal understanding.  

## 2015-01-21 ENCOUNTER — Emergency Department (HOSPITAL_COMMUNITY): Payer: Medicare Other

## 2015-01-21 ENCOUNTER — Encounter (HOSPITAL_COMMUNITY): Payer: Self-pay

## 2015-01-21 ENCOUNTER — Emergency Department (HOSPITAL_COMMUNITY)
Admission: EM | Admit: 2015-01-21 | Discharge: 2015-01-21 | Disposition: A | Discharge status: Home / Self Care | Payer: Medicare Other | Attending: Emergency Medicine | Admitting: Emergency Medicine

## 2015-01-21 DIAGNOSIS — I10 Essential (primary) hypertension: Secondary | ICD-10-CM | POA: Insufficient documentation

## 2015-01-21 DIAGNOSIS — Z9889 Other specified postprocedural states: Secondary | ICD-10-CM | POA: Insufficient documentation

## 2015-01-21 DIAGNOSIS — Z79899 Other long term (current) drug therapy: Secondary | ICD-10-CM | POA: Diagnosis not present

## 2015-01-21 DIAGNOSIS — Z8709 Personal history of other diseases of the respiratory system: Secondary | ICD-10-CM | POA: Diagnosis not present

## 2015-01-21 DIAGNOSIS — M25552 Pain in left hip: Secondary | ICD-10-CM | POA: Diagnosis not present

## 2015-01-21 DIAGNOSIS — Z87891 Personal history of nicotine dependence: Secondary | ICD-10-CM | POA: Insufficient documentation

## 2015-01-21 DIAGNOSIS — R011 Cardiac murmur, unspecified: Secondary | ICD-10-CM | POA: Diagnosis not present

## 2015-01-21 DIAGNOSIS — Z862 Personal history of diseases of the blood and blood-forming organs and certain disorders involving the immune mechanism: Secondary | ICD-10-CM | POA: Diagnosis not present

## 2015-01-21 DIAGNOSIS — Y9389 Activity, other specified: Secondary | ICD-10-CM | POA: Diagnosis not present

## 2015-01-21 DIAGNOSIS — E785 Hyperlipidemia, unspecified: Secondary | ICD-10-CM | POA: Insufficient documentation

## 2015-01-21 DIAGNOSIS — Y998 Other external cause status: Secondary | ICD-10-CM | POA: Insufficient documentation

## 2015-01-21 DIAGNOSIS — I472 Ventricular tachycardia: Secondary | ICD-10-CM | POA: Insufficient documentation

## 2015-01-21 DIAGNOSIS — M797 Fibromyalgia: Secondary | ICD-10-CM | POA: Diagnosis not present

## 2015-01-21 DIAGNOSIS — X58XXXA Exposure to other specified factors, initial encounter: Secondary | ICD-10-CM | POA: Diagnosis not present

## 2015-01-21 DIAGNOSIS — S79912A Unspecified injury of left hip, initial encounter: Secondary | ICD-10-CM | POA: Insufficient documentation

## 2015-01-21 DIAGNOSIS — Z7952 Long term (current) use of systemic steroids: Secondary | ICD-10-CM | POA: Diagnosis not present

## 2015-01-21 DIAGNOSIS — F419 Anxiety disorder, unspecified: Secondary | ICD-10-CM | POA: Insufficient documentation

## 2015-01-21 DIAGNOSIS — K219 Gastro-esophageal reflux disease without esophagitis: Secondary | ICD-10-CM | POA: Insufficient documentation

## 2015-01-21 DIAGNOSIS — R52 Pain, unspecified: Secondary | ICD-10-CM

## 2015-01-21 DIAGNOSIS — E119 Type 2 diabetes mellitus without complications: Secondary | ICD-10-CM | POA: Insufficient documentation

## 2015-01-21 DIAGNOSIS — Y9289 Other specified places as the place of occurrence of the external cause: Secondary | ICD-10-CM | POA: Diagnosis not present

## 2015-01-21 DIAGNOSIS — I2510 Atherosclerotic heart disease of native coronary artery without angina pectoris: Secondary | ICD-10-CM | POA: Insufficient documentation

## 2015-01-21 MED ORDER — HYDROCODONE-ACETAMINOPHEN 5-325 MG PO TABS
2.0000 | ORAL_TABLET | Freq: Once | ORAL | Status: AC
Start: 2015-01-21 — End: 2015-01-21
  Administered 2015-01-21: 2 via ORAL
  Filled 2015-01-21: qty 2

## 2015-01-21 MED ORDER — HYDROCODONE-ACETAMINOPHEN 5-325 MG PO TABS: 1.0000 | ORAL_TABLET | Freq: Four times a day (QID) | ORAL | Status: DC | PRN

## 2015-01-21 NOTE — ED Notes (Signed)
Pt left with all his belongings and was wheeled out of the treatment area.  

## 2015-01-21 NOTE — ED Notes (Signed)
Pt states her left leg and hip started hurting yesterday. No known injury. No hx of sciatica. Uses a cane to walk.

## 2015-01-21 NOTE — Discharge Instructions (Signed)
Bursitis °Bursitis is inflammation and irritation of a bursa, which is one of the small, fluid-filled sacs that cushion and protect the moving parts of your body. These sacs are located between bones and muscles, muscle attachments, or skin areas next to bones. A bursa protects these structures from the wear and tear that results from frequent movement. °An inflamed bursa causes pain and swelling. Fluid may build up inside the sac. Bursitis is most common near joints, especially the knees, elbows, hips, and shoulders. °CAUSES °Bursitis can be caused by:  °· Injury from: °¨ A direct blow, like falling on your knee or elbow. °¨ Overuse of a joint (repetitive stress). °· Infection. This can happen if bacteria gets into a bursa through a cut or scrape near a joint. °· Diseases that cause joint inflammation, such as gout and rheumatoid arthritis. °RISK FACTORS °You may be at risk for bursitis if you:  °· Have a job or hobby that involves a lot of repetitive stress on your joints. °· Have a condition that weakens your body's defense system (immune system), such as diabetes, cancer, or HIV. °· Lift and reach overhead often. °· Kneel or lean on hard surfaces often. °· Run or walk often. °SIGNS AND SYMPTOMS °The most common signs and symptoms of bursitis are: °· Pain that gets worse when you move the affected body part or put weight on it. °· Inflammation. °· Stiffness. °Other signs and symptoms may include: °· Redness. °· Tenderness. °· Warmth. °· Pain that continues after rest. °· Fever and chills. This may occur in bursitis caused by infection. °DIAGNOSIS °Bursitis may be diagnosed by:  °· Medical history and physical exam. °· MRI. °· A procedure to drain fluid from the bursa with a needle (aspiration). The fluid may be checked for signs of infection or gout. °· Blood tests to rule out other causes of inflammation. °TREATMENT  °Bursitis can usually be treated at home with rest, ice, compression, and elevation (RICE). For  mild bursitis, RICE treatment may be all you need. Other treatments may include: °· Nonsteroidal anti-inflammatory drugs (NSAIDs) to treat pain and inflammation. °· Corticosteroids to fight inflammation. You may have these drugs injected into and around the area of bursitis. °· Aspiration of bursitis fluid to relieve pain and improve movement. °· Antibiotic medicine to treat an infected bursa. °· A splint, brace, or walking aid. °· Physical therapy if you continue to have pain or limited movement. °· Surgery to remove a damaged or infected bursa. This may be needed if you have a very bad case of bursitis or if other treatments have not worked. °HOME CARE INSTRUCTIONS  °· Take medicines only as directed by your health care provider. °· If you were prescribed an antibiotic medicine, finish it all even if you start to feel better. °· Rest the affected area as directed by your health care provider. °¨ Keep the area elevated. °¨ Avoid activities that make pain worse. °· Apply ice to the injured area: °¨ Place ice in a plastic bag. °¨ Place a towel between your skin and the bag. °¨ Leave the ice on for 20 minutes, 2-3 times a day. °· Use splints, braces, pads, or walking aids as directed by your health care provider. °· Keep all follow-up visits as directed by your health care provider. This is important. °PREVENTION  °· Wear knee pads if you kneel often. °· Wear sturdy running or walking shoes that fit you well. °· Take regular breaks from repetitive activity. °· Warm   up by stretching before doing any strenuous activity.  Maintain a healthy weight or lose weight as recommended by your health care provider. Ask your health care provider if you need help.  Exercise regularly. Start any new physical activity gradually. SEEK MEDICAL CARE IF:   Your bursitis is not responding to treatment or home care.  You have a fever.  You have chills.   This information is not intended to replace advice given to you by your  health care provider. Make sure you discuss any questions you have with your health care provider.   Document Released: 01/25/2000 Document Revised: 10/18/2014 Document Reviewed: 04/18/2013 Elsevier Interactive Patient Education 2016 Elsevier Inc. Tendinitis Tendinitis is swelling and inflammation of the tendons. Tendons are band-like tissues that connect muscle to bone. Tendinitis commonly occurs in the:   Shoulders (rotator cuff).  Heels (Achilles tendon).  Elbows (triceps tendon). CAUSES Tendinitis is usually caused by overusing the tendon, muscles, and joints involved. When the tissue surrounding a tendon (synovium) becomes inflamed, it is called tenosynovitis. Tendinitis commonly develops in people whose jobs require repetitive motions. SYMPTOMS  Pain.  Tenderness.  Mild swelling. DIAGNOSIS Tendinitis is usually diagnosed by physical exam. Your health care provider may also order X-rays or other imaging tests. TREATMENT Your health care provider may recommend certain medicines or exercises for your treatment. HOME CARE INSTRUCTIONS   Use a sling or splint for as long as directed by your health care provider until the pain decreases.  Put ice on the injured area.  Put ice in a plastic bag.  Place a towel between your skin and the bag.  Leave the ice on for 15-20 minutes, 3-4 times a day, or as directed by your health care provider.  Avoid using the limb while the tendon is painful. Perform gentle range of motion exercises only as directed by your health care provider. Stop exercises if pain or discomfort increase, unless directed otherwise by your health care provider.  Only take over-the-counter or prescription medicines for pain, discomfort, or fever as directed by your health care provider. SEEK MEDICAL CARE IF:   Your pain and swelling increase.  You develop new, unexplained symptoms, especially increased numbness in the hands. MAKE SURE YOU:   Understand these  instructions.  Will watch your condition.  Will get help right away if you are not doing well or get worse.   This information is not intended to replace advice given to you by your health care provider. Make sure you discuss any questions you have with your health care provider.   Document Released: 01/25/2000 Document Revised: 02/17/2014 Document Reviewed: 04/15/2010 Elsevier Interactive Patient Education [employer].

## 2015-01-21 NOTE — ED Provider Notes (Signed)
CSN: [id number]     Arrival date & time 01/21/15  2138 History   First MD Initiated Contact with Patient 01/21/15 2151     Chief Complaint  Patient presents with  . Leg Pain  . Hip Pain     (Consider location/radiation/quality/duration/timing/severity/associated sxs/prior Treatment) HPI Comments: Patient presents to the emergency department with chief complaint of left hip pain. She states that her left hip started hurting yesterday. She states that she crossed her left leg over her right knee and felt a pop. States that she has had gradually worsening pain since then. She has tried taking ibuprofen with minimal relief. She is able to ambulate, but it is painful, and she has to use a cane. She denies any radiating symptoms down the backs of the leg. She has no history of sciatica. No back pain. She denies any other injuries. She denies any bowel or bladder incontinence. Denies any numbness, weakness, or tingling.  The history is provided by the patient. No language interpreter was used.    Past Medical History  Diagnosis Date  . Hyperlipidemia   . Chest pain   . Nonsustained ventricular tachycardia ([place])   . Fibromyalgia   . Coronary artery disease     Mild to moderate CAD  . Palpitations   . Hx of echocardiogram     Echo (03/2013):  Tech limited; Mild focal basal septal hypertrophy, EF 60-65%, normal RVF  . Family history of anesthesia complication     daughter N/V  . Heart murmur   . Dysrhythmia     has fluttering at times  . Shortness of breath     loses breathe at times and have to take extra breathes  . Diabetes mellitus without complication ([place])   . Hypertension   . Bronchitis with emphysema   . Anxiety     on meds  . GERD (gastroesophageal reflux disease)     on meds  . Headache(784.0)   . Anemia     3 months ago anemic   Past Surgical History  Procedure Laterality Date  . Cardiac catheterization  05/04/2007    reveals overall normal left ventricular systolic  function. Ejection fraction 65-70%  . Total abdominal hysterectomy  1982  . Breast surgery  approx 1992    bilateral breast surgery for fibrocystic breast disease  . Tonsillectomy    . Colonoscopy w/ biopsies and polypectomy    . Cataract extraction w/phaco Right 07/20/2013    Procedure: CATARACT EXTRACTION PHACO AND INTRAOCULAR LENS PLACEMENT (IOC);  Surgeon: Marylynn Pearson, MD;  Location: [facility];  Service: Ophthalmology;  Laterality: Right;  . Mini shunt insertion Right 07/20/2013    Procedure: INSERTION OF GLAUCOMA FILTRATION DEVICE RIGHT EYE;  Surgeon: Marylynn Pearson, MD;  Location: [facility];  Service: Ophthalmology;  Laterality: Right;  . Mitomycin c application Right [id number]    Procedure: MITOMYCIN C APPLICATION;  Surgeon: Marylynn Pearson, MD;  Location: [facility];  Service: Ophthalmology;  Laterality: Right;  . Esophagogastroduodenoscopy (egd) with propofol N/A 09/07/2014    Procedure: ESOPHAGOGASTRODUODENOSCOPY (EGD) WITH PROPOFOL;  Surgeon: Juanita Craver, MD;  Location: [facility];  Service: Endoscopy;  Laterality: N/A;  . Colonoscopy with propofol N/A 09/07/2014    Procedure: COLONOSCOPY WITH PROPOFOL;  Surgeon: Juanita Craver, MD;  Location: [facility];  Service: Endoscopy;  Laterality: N/A;   Family History  Problem Relation Age of Onset  . Heart disease Father   . Heart disease Paternal Grandmother   . Cancer Mother  unknown type, ?lung  . Cancer Paternal Grandmother     unknown type  . Diabetes Father   . Diabetes Brother   . Glaucoma Father   . Glaucoma Brother    Social History  Substance Use Topics  . Smoking status: Former Smoker -- 1.00 packs/day for 4 years    Types: Cigarettes    Quit date: 02/11/1980  . Smokeless tobacco: Never Used  . Alcohol Use: No   OB History    Gravida Para Term Preterm AB TAB SAB Ectopic Multiple Living   2 2        2      Review of Systems  Constitutional: Negative for fever and chills.  Respiratory: Negative for shortness of breath.    Cardiovascular: Negative for chest pain.  Gastrointestinal: Negative for nausea, vomiting, diarrhea and constipation.  Genitourinary: Negative for dysuria.  Musculoskeletal: Positive for arthralgias.  All other systems reviewed and are negative.     Allergies  Crestor; Demerol; and Shellfish allergy  Home Medications   Prior to Admission medications   Medication Sig Start Date End Date Taking? Authorizing Provider  albuterol (PROVENTIL) (2.5 MG/3ML) 0.083% nebulizer solution INHALE 2 ML(S) BY NEBULIZATION 3 TIMES A DAY AS NEEDED FOR EXACERBATIONS 12/11/14   Historical Provider, MD  ALPRAZolam Duanne Moron) 0.5 MG tablet Take 0.25 mg by mouth at bedtime as needed for anxiety or sleep. For sleep    Historical Provider, MD  atorvastatin (LIPITOR) 20 MG tablet TAKE 1 TABLET BY MOUTH EVERY DAY 01/08/15   Thayer Headings, MD  brimonidine (ALPHAGAN P) 0.1 % SOLN Place 1 drop into both eyes every 8 (eight) hours.    Historical Provider, MD  brinzolamide (AZOPT) 1 % ophthalmic suspension Place 1 drop into both eyes 3 (three) times daily.     Historical Provider, MD  esomeprazole (NEXIUM) 40 MG capsule Take 40 mg by mouth 2 (two) times daily as needed (reflux).     Historical Provider, MD  furosemide (LASIX) 20 MG tablet Take 20 mg by mouth as needed.    Historical Provider, MD  JANUMET 50-500 MG per tablet Take 1 tablet by mouth Twice daily. 06/05/11   Historical Provider, MD  loteprednol (LOTEMAX) 0.5 % ophthalmic suspension Place 1 drop into the left eye 2 (two) times daily.     Historical Provider, MD  metoprolol (LOPRESSOR) 50 MG tablet TAKE 1 TABLET BY MOUTH 2 TIMES DAILY. 10/02/14   Thayer Headings, MD  mometasone-formoterol (DULERA) 200-5 MCG/ACT AERO Inhale 2 puffs into the lungs 2 (two) times daily. 08/25/13   Deneise Lever, MD  montelukast (SINGULAIR) 10 MG tablet Take 10 mg by mouth at bedtime.    Historical Provider, MD  olmesartan (BENICAR) 20 MG tablet Take 20 mg by mouth daily.     Historical Provider, MD  ONE TOUCH ULTRA TEST test strip 1 each by Other route as needed for other (blood sugar).  03/28/11   Historical Provider, MD  pregabalin (LYRICA) 100 MG capsule Take 100 mg by mouth at bedtime.     Historical Provider, MD  PROAIR HFA 108 (90 BASE) MCG/ACT inhaler Inhale 2 puffs into the lungs every 6 (six) hours as needed for wheezing.  10/02/14   Historical Provider, MD  spironolactone (ALDACTONE) 25 MG tablet Take 0.5 tablets (12.5 mg total) by mouth daily. 01/11/15   Scott Joylene Draft, PA-C  Travoprost (TRAVATAN OP) Place 1 drop into both eyes at bedtime.     Historical Provider, MD  BP 160/71 mmHg  Pulse 68  Temp(Src) 97.6 F (36.4 C) (Oral)  Resp 16  SpO2 99% Physical Exam  Constitutional: She is oriented to person, place, and time. She appears well-developed and well-nourished.  HENT:  Head: Normocephalic and atraumatic.  Eyes: Conjunctivae and EOM are normal. Pupils are equal, round, and reactive to light.  Neck: Normal range of motion. Neck supple.  Cardiovascular: Normal rate and regular rhythm.  Exam reveals no gallop and no friction rub.   No murmur heard. Pulmonary/Chest: Effort normal and breath sounds normal. No respiratory distress. She has no wheezes. She has no rales. She exhibits no tenderness.  Abdominal: Soft. Bowel sounds are normal. She exhibits no distension and no mass. There is no tenderness. There is no rebound and no guarding.  Musculoskeletal: Normal range of motion. She exhibits no edema or tenderness.  Left hip tender to palpation over the greater trochanter, no bony abnormality or deformity, range of motion and strength limited secondary to pain  Neurological: She is alert and oriented to person, place, and time.  Skin: Skin is warm and dry.  Psychiatric: She has a normal mood and affect. Her behavior is normal. Judgment and thought content normal.  Nursing note and vitals reviewed.   ED Course  Procedures (including critical care  time)  Imaging Review Dg Hip Unilat With Pelvis 2-3 Views Left  01/21/2015  CLINICAL DATA:  Left hip pain for 2 days.  No known injury. EXAM: DG HIP (WITH OR WITHOUT PELVIS) 2-3V LEFT COMPARISON:  None. FINDINGS: Degenerative changes in the lower lumbar spine and in both hips. Left hip demonstrates joint space narrowing and sclerosis particularly in the superior acetabular region. Osteophytes on the acetabulum and femoral head. No evidence of acute fracture or dislocation. Pelvis appears intact. SI joints and symphysis pubis are not displaced. IMPRESSION: Degenerative changes in the left hip. No acute displaced fractures identified. Electronically Signed   By: Lucienne Capers M.D.   On: 01/21/2015 22:55   I have personally reviewed and evaluated these images and lab results as part of my medical decision-making.   MDM   Final diagnoses:  Hip pain, left    Patient with left hip pain since yesterday.  Felt a pop after crossing her leg over her contralateral knee.   Plain films are negative.  Patient is ambulatory.  Suspect tendonitis vs muscle strain vs greater trochanter bursitis.  Discussed with Dr. Maryan Rued, who agrees with the plan. Plan for orthopedic follow-up.  DC with pain meds.  Patient understands and agrees with the plan.    Montine Circle, PA-C 01/21/15 [id number]  Blanchie Dessert, MD 01/22/15 0010

## 2015-01-24 ENCOUNTER — Telehealth: Payer: Self-pay | Admitting: Nurse Practitioner

## 2015-01-24 ENCOUNTER — Encounter: Payer: Self-pay | Admitting: Physician Assistant

## 2015-01-24 NOTE — Telephone Encounter (Signed)
Left message for patient to call office for monitor results 

## 2015-01-26 ENCOUNTER — Encounter (HOSPITAL_COMMUNITY): Payer: Medicare Other

## 2015-01-29 ENCOUNTER — Telehealth: Payer: Self-pay | Admitting: *Deleted

## 2015-01-29 DIAGNOSIS — H4043X3 Glaucoma secondary to eye inflammation, bilateral, severe stage: Secondary | ICD-10-CM | POA: Diagnosis not present

## 2015-01-29 DIAGNOSIS — D869 Sarcoidosis, unspecified: Secondary | ICD-10-CM | POA: Diagnosis not present

## 2015-01-29 NOTE — Telephone Encounter (Signed)
Pt returning Rn phone call concerning monitor results. Please call back and discuss.

## 2015-01-29 NOTE — Telephone Encounter (Signed)
Pt notified of monitor results by phone with verbal understanding to findings. pt advised to keep f/u 02/19/15 @ 10:10 with Richardson Dopp, PAC. Pt agreeable to plan of care.

## 2015-01-29 NOTE — Telephone Encounter (Signed)
Pt notified of monitor results today

## 2015-02-01 ENCOUNTER — Encounter: Payer: Self-pay | Admitting: Cardiovascular Disease

## 2015-02-14 DIAGNOSIS — D869 Sarcoidosis, unspecified: Secondary | ICD-10-CM | POA: Diagnosis not present

## 2015-02-14 DIAGNOSIS — H4043X3 Glaucoma secondary to eye inflammation, bilateral, severe stage: Secondary | ICD-10-CM | POA: Diagnosis not present

## 2015-02-15 ENCOUNTER — Other Ambulatory Visit: Payer: Self-pay | Admitting: Ophthalmology

## 2015-02-15 NOTE — H&P (Signed)
History & Physical:   DATE:   02-14-15  NAMEMarland Kitchen  Sheila Reynolds, Sheila Reynolds     [id number]       HISTORY OF PRESENT ILLNESS: Referred by Dr Ricki Miller    Chief Eye Complaints glaucoma eye pain    Va seems to be about the same. patient states OD eyelid seems to be closing, and she cant really see out of it.  patient  states that she sees dead spots in OD, and if its dark she cant see.     HPI: EYES: Reports symptoms of visual floaters or spots, vision disturbances.   OD      LOCATION: BOTH EYES        QUALITY/COURSE:   Reports condition is worsening.        INTENSITY/SEVERITY:    Mild   Reports measurement ( or degree) as        DURATION:   Reports the general length of symptoms to be years.             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   ICD10:   ICD9: 250.50  Onset: 05/03/2013 10:03   Sarcoidosis   ICD10: D86.9  ICD9: 135  Onset: 05/03/2013 10:04   Posterior subcapsular polar senile cataract   ICD10:   ICD9: 366.14  Onset: 05/03/2013 10:53  OD  SURGERIES: 07/20/13 Pseudophakia   ICD10: Z96.1 ICD9: 446.0 Onset: 07/21/2013 16:44  glaucoma sx with expressed shunt and mito-C  OD  Pick List - Surgeries  MEDICATIONS: Janumet: Strength-  SIG-    Lyrica (Pregabalin):   50 mg capsule  SIG-  1 cap(s)   2 times a day   Metoprolol  (Lopressor):   50 mg tablet  SIG-  1 each   2 times a day   Hydrochlorothiazide (HCTZ):   12.5 mg capsule  SIG-  1 each   once a day   Lipitor (Atorvastatin):   40 mg tablet  SIG-  1 tab(s)   once a day (at bedtime)    Singulair (Montelukast):   4 mg granule  SIG-  1 tab(s)   once a day (in the evening)   Aspirin:  81 mg tablet  SIG-  1 each   once a day   Dulera: Strength-  SIG-    Ventolin: Strength-  SIG-  Dose-  Freq-    Azopt: 1% suspension SIG-  1 gtt in both eyes TID   Travatan Z: 0.004% solution SIG-  1 gtt OU QH    Alphagan P: Strength-  SIG-  1 gtt in each affected eye 3 times a day for 30 days OU  Lotemax  Lotemax: 0.5% gel SIG-  1 gtt OU PRN    Durezol: 0.05% emulsion SIG-  1 drop in OD 6 times a day   Ilevro: 0.3% suspension SIG-  1 gtt in each affected eye once a day for 14 days  REVIEW OF SYSTEMS: ROS:  GEN- Constitutional: HENT: +++++      cough LUNGS/Respiratory:  +++++      cough HEART/Cardiovascular: Reports symptoms of palpitations.    ABD/Gastrointestinal:  Musculoskeletal (BJE): +++++      arthralgias    muscle pain    foot/toe pain or problems   spasm  NEURO/Neurological: PSYCH/Psychiatric:    Is the pt oriented to time, place,yes person? yes  Mood depressed  normal   TOBACCO:Smoker Status:   Former Smoker. ICD10: Z87.891 ICD9: V15.82 Onset: 05/03/2013 09:34 Initial Date:      SOCIAL HISTORY: retired  Artist  FAMILY HISTORY: Positive family history for  -   Glaucoma:;   Diabetes - Type 2:   Negative family history for  -   PARENTS: Glaucoma:;   Cancer:  father went blind  Family History - 1st Degree Relatives:  Mother dead.  Father is dead.  ALLERGIES: Beta blockers  Crestor Severity-  Onset-  Reaction-  Status- Active Type- Drug allergy Date Changed-   Oyster Shell Calcium 500:     PHYSICAL EXAMINATION: VS: BMI: 31.2.  BP: 177/90.  H: 62.00 in.  P: 65 /min.  W: 170lbs 0oz.    Va    OD: cc 20/40 OS: cc 20/30+1  EYEGLASSES:  OD: +1.00 +1.75 x174                                               OS: +1.50 +1.00 x001 ADD: +2.00  Auto Refraction05/01/2014 10:27  OD: -0.25 +0.75  x001 OS: -1.00 +1.25 x109  K'S05/01/2014 10:28   OD: 44.50 45.50  OS: 44.75  45.50   MR 06/21/2013 10:27   OD: +1.00 +1.75 x174 NI OS: +1.00 +1.00 x001 20/30 ADD   VF:  OD small island of vision                                               OS  Motility orthophoria and full  PUPILS: 3 mm round reactive negative Marcus Gunn  EYELIDS & OCULAR ADNEXAptosis OD guarding ptosis OS  SLE: Conjunctiva quiet OD, plus one injection, OS  Cornea arcus each eye   anterior chamber  deep & quiet OD,      trace cell & flare  OS  Iris Brown each eye no rubeosis either eye  Lens Plus one nuclear sclerosis each eye,  +2 posterior subcapsular opacity OD  Vitreous posterior vitreous detachment each eye  Ta   in mmHg    OD 26     OS 24,25 Time 06/21/2013 11:25  Dilation tropicamide  Fundus:  optic nerve  OD   inferior rim loss                                                                  OS 70% cup optic nerve intact   Macula      OD  irregular light reflex  OS normal light reflex  Vessels perivascular sheathing  Periphery normal     Exam: GENERAL: Appearance: HEAD, EARS, NOSE AND THROAT: Ears-Nose (external) Inspection: Externally, nose and ears are normal in appearance and without scars, lesions, or nodules.      Hearing assessment shows no problems with normal conversation.      LUNGS and RESPIRATORY: Lung auscultation elicits no wheezing, rhonci, rales or rubs and with equal breath sounds.    Respiratory effort described as breathing is unlabored and chest movement is symmetrical.    HEART (Cardiovascular): Heart auscultation discovers regular rate and rhythm; no murmur, gallop or rub. Normal heart sounds.    ABDOMEN (Gastrointestinal): Mass/Tenderness Exam: Neither are present.     MUSCULOSKELETAL (BJE): Inspection-Palpation: No major bone, joint, tendon, or muscle changes.      NEUROLOGICAL: Alert and oriented. No major deficits of coordination or sensation.       PSYCHIATRIC: Insight and judgment appear  both to be intact and appropriate.    Mood and affect are described as normal mood and full affect.    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 Venetia Maxon explained the  Risk and benefits of surgery have been reviewed with the patient and the patient agrees to proceed with the surgical procedure. he also explained the possibility of loosing her sight is greater without the surgery.     Visual Fields  & OCT TODAY  Handouts: glaucoma , what is glaucoma?, What is a cataract?, New Handout, glaucoma treatment, glaucoma , what is glaucoma?, What is a cataract?, New Handout, glaucoma treatment, Erythromycins, Insect Bites/stings, glaucoma , what is glaucoma?, New Handout, glaucoma treatment, What is a cataract?, Erythromycins, Insect Bites/stings, glaucoma , what is glaucoma?, New Handout, glaucoma treatment, What is a cataract?.    ___________________________ Marylynn Pearson, Brooke Bonito Starter - Inactive Problems:

## 2015-02-18 NOTE — Progress Notes (Signed)
Cardiology Office Note    Date:  02/18/2015   ID:  Sheila Reynolds, DOB [birth year], MRN [id number]  PCP:  Maximino Greenland, MD  Cardiologist:  Dr. Liam Rogers   Electrophysiologist:  n/a  Chief Complaint  Patient presents with  . Follow-up  . Palpitations  . Chest Pain    History of Present Illness:  Sheila Reynolds is a 70 y.o. female with a hx of nonobstructive CAD, HL, NSVT, palpitations, sarcoidosis. PFTs in 09/2012 demonstrated mild obstructive airway disease with response to bronchodilator, moderate restriction and severely reduced diffusion capacity. Her 6 minute walk test was normal with normal oxygen saturation. Event monitor in 3/15 demonstrated occasional PVCs but no VT. Beta blocker has been used to control palpitations. Last seen by Dr. Acie Fredrickson 4/16. Patient called in 11/30 with complaints of fluttering and palpitations blood pressure was noted to be 189/95. She was given an extra half dose of metoprolol and follow-up arranged today.  Patient notes increasing palpitations over the past couple of weeks. They sometimes awaken her from sleep. She feels as though she needs to cough. She describes symptoms that sound like she feels a skipping sensation. There are no activities that necessarily bring on palpitations. She does note some left thoracic pain that radiates to her left chest with prolonged standing. She also notes exertional shortness of breath. This is fairly chronic but seems to be getting worse. She also notes some tightness in her chest associated with this. She denies any radiating symptoms. She sleeps on an incline chronically. She sleeps with CPAP. She denies edema. She denies syncope. She's been treated with antibiotics and inhalers over the past couple of months for episodes of bronchitis. She denies any bleeding symptoms.      Past Medical History  Diagnosis Date  . Hyperlipidemia   . Chest pain   . Nonsustained ventricular tachycardia ([place])   .  Fibromyalgia   . Coronary artery disease     Mild to moderate CAD  . Palpitations   . Hx of echocardiogram     Echo (03/2013):  Tech limited; Mild focal basal septal hypertrophy, EF 60-65%, normal RVF  . Family history of anesthesia complication     daughter N/V  . Heart murmur   . Dysrhythmia     has fluttering at times  . Shortness of breath     loses breathe at times and have to take extra breathes  . Diabetes mellitus without complication ([place])   . Hypertension   . Bronchitis with emphysema   . Anxiety     on meds  . GERD (gastroesophageal reflux disease)     on meds  . Headache(784.0)   . Anemia     3 months ago anemic  . PVC (premature ventricular contraction)     a. Holter 12/16: NSR, occ PAC,PVCs    Past Surgical History  Procedure Laterality Date  . Cardiac catheterization  05/04/2007    reveals overall normal left ventricular systolic function. Ejection fraction 65-70%  . Total abdominal hysterectomy  1982  . Breast surgery  approx 1992    bilateral breast surgery for fibrocystic breast disease  . Tonsillectomy    . Colonoscopy w/ biopsies and polypectomy    . Cataract extraction w/phaco Right 07/20/2013    Procedure: CATARACT EXTRACTION PHACO AND INTRAOCULAR LENS PLACEMENT (IOC);  Surgeon: Marylynn Pearson, MD;  Location: [facility];  Service: Ophthalmology;  Laterality: Right;  . Mini shunt insertion Right 07/20/2013    Procedure: INSERTION  OF GLAUCOMA FILTRATION DEVICE RIGHT EYE;  Surgeon: Marylynn Pearson, MD;  Location: [facility];  Service: Ophthalmology;  Laterality: Right;  . Mitomycin c application Right [id number]    Procedure: MITOMYCIN C APPLICATION;  Surgeon: Marylynn Pearson, MD;  Location: [facility];  Service: Ophthalmology;  Laterality: Right;  . Esophagogastroduodenoscopy (egd) with propofol N/A 09/07/2014    Procedure: ESOPHAGOGASTRODUODENOSCOPY (EGD) WITH PROPOFOL;  Surgeon: Juanita Craver, MD;  Location: [facility];  Service: Endoscopy;  Laterality: N/A;  . Colonoscopy with  propofol N/A 09/07/2014    Procedure: COLONOSCOPY WITH PROPOFOL;  Surgeon: Juanita Craver, MD;  Location: [facility];  Service: Endoscopy;  Laterality: N/A;    Current Outpatient Prescriptions  Medication Sig Dispense Refill  . albuterol (PROVENTIL) (2.5 MG/3ML) 0.083% nebulizer solution INHALE 2 ML(S) BY NEBULIZATION 3 TIMES A DAY AS NEEDED FOR EXACERBATIONS  1  . ALPRAZolam (XANAX) 0.5 MG tablet Take 0.25 mg by mouth at bedtime as needed for anxiety or sleep. For sleep    . aspirin EC 81 MG tablet Take 81 mg by mouth daily.    [name] atorvastatin (LIPITOR) 20 MG tablet TAKE 1 TABLET BY MOUTH EVERY DAY 30 tablet 11  . brimonidine (ALPHAGAN P) 0.1 % SOLN Place 1 drop into both eyes 2 (two) times daily. Reported on 02/15/2015    . brinzolamide (AZOPT) 1 % ophthalmic suspension Place 1 drop into both eyes 3 (three) times daily.     [name] esomeprazole (NEXIUM) 40 MG capsule Take 40 mg by mouth 2 (two) times daily as needed (reflux).     . furosemide (LASIX) 20 MG tablet Take 20 mg by mouth every 3 (three) days.     [name] HYDROcodone-acetaminophen (NORCO/VICODIN) 5-325 MG tablet Take 1-2 tablets by mouth every 6 (six) hours as needed. (Patient not taking: Reported on 02/15/2015) 12 tablet 0  . JANUMET 50-500 MG per tablet Take 1 tablet by mouth Twice daily.    [name] loteprednol (LOTEMAX) 0.5 % ophthalmic suspension Place 1 drop into the left eye 2 (two) times daily as needed (inflammation).     . metoprolol (LOPRESSOR) 50 MG tablet TAKE 1 TABLET BY MOUTH 2 TIMES DAILY. 60 tablet 11  . mometasone-formoterol (DULERA) 200-5 MCG/ACT AERO Inhale 2 puffs into the lungs 2 (two) times daily. (Patient taking differently: Inhale 2 puffs into the lungs 2 (two) times daily as needed for wheezing or shortness of breath. ) 1 Inhaler 0  . montelukast (SINGULAIR) 10 MG tablet Take 10 mg by mouth at bedtime.    [name] olmesartan (BENICAR) 20 MG tablet Take 20 mg by mouth daily.    . prednisoLONE acetate (PRED FORTE) 1 % ophthalmic suspension  Place 1 drop into the right eye 2 (two) times daily.    . pregabalin (LYRICA) 75 MG capsule Take 75 mg by mouth at bedtime.    [name] PROAIR HFA 108 (90 BASE) MCG/ACT inhaler Inhale 2 puffs into the lungs every 6 (six) hours as needed for wheezing.   2  . spironolactone (ALDACTONE) 25 MG tablet Take 0.5 tablets (12.5 mg total) by mouth daily. (Patient not taking: Reported on 02/15/2015) 30 tablet 11  . Travoprost (TRAVATAN OP) Place 1 drop into both eyes at bedtime.      No current facility-administered medications for this visit.    Allergies:   Crestor; Shellfish allergy; and Demerol   Social History   Social History  . Marital Status: Married    Spouse Name: N/A  . Number of Children: 2  .  Years of Education: N/A   Occupational History  . unemployed    Social History Main Topics  . Smoking status: Former Smoker -- 1.00 packs/day for 4 years    Types: Cigarettes    Quit date: 02/11/1980  . Smokeless tobacco: Never Used  . Alcohol Use: No  . Drug Use: No  . Sexual Activity: Not on file   Other Topics Concern  . Not on file   Social History Narrative     Family History:  The patient's family history includes Cancer in her mother and paternal grandmother; Diabetes in her brother and father; Glaucoma in her brother and father; Heart disease in her father and paternal grandmother.   ROS:   Please see the history of present illness.    ROS All other systems reviewed and are negative.   PHYSICAL EXAM:   VS:  There were no vitals taken for this visit.   GEN: Well nourished, well developed, in no acute distress HEENT: normal Neck: no JVD, no masses Cardiac: Normal S1/S2, RRR; no murmurs, rubs, or gallops, no edema;   carotid bruits,   Respiratory:  clear to auscultation bilaterally; no wheezing, rhonchi or rales GI: soft, nontender, nondistended, + BS MS: no deformity or atrophy Skin: warm and dry, no rash Neuro:  Bilateral strength equal, no focal deficits  Psych: Alert and  oriented x 3, normal affect  Wt Readings from Last 3 Encounters:  01/11/15 167 lb (75.751 kg)  12/01/14 168 lb 6.4 oz (76.386 kg)  09/14/14 167 lb (75.751 kg)      Studies/Labs Reviewed:   EKG:  EKG is  ordered today.  The ekg ordered today demonstrates   Recent Labs: 05/24/2014: ALT 17 12/01/2014: Hemoglobin 11.7*; Platelets 193.0 01/11/2015: Magnesium 1.6; TSH 1.019 01/19/2015: BUN 12; Creat 0.74; Potassium 4.0; Sodium 138   Recent Lipid Panel    Component Value Date/Time   CHOL 157 05/24/2014 1650   TRIG 104.0 05/24/2014 1650   HDL 51.70 05/24/2014 1650   CHOLHDL 3 05/24/2014 1650   VLDL 20.8 05/24/2014 1650   LDLCALC 85 05/24/2014 1650    Additional studies/ records that were reviewed today include:   Holter 01/12/15  NSR  occasional PVCs and PACs  Echo 08/01/14 EF 55-60%, normal wall motion, grade 1 diastolic dysfunction, normal RV function, trivial effusion  Event Monitor 3/15 NSR, occ PVC, no NSVT  Echo 04/04/13 Mild focal basal septal hypertrophy, EF 60-65%  Lexiscan Myoview (09/02/12):  No ischemia, EF 71%, normal study.  Event monitor (02/2012):  NSR, PAC/PVCs.  Echocardiogram (09/2010):  EF 60-65%.  LHC (04/2007):  Proximal LAD 40, proximal D1 30-40, EF 65-70%.    ASSESSMENT:    No diagnosis found.  PLAN:  In order of problems listed above:  1. Palpitations: Palpitations are somewhat similar to previous symptoms but worse. We have documented PVCs in the past. She does have a history of nonsustained ventricular tachycardia. She remains a beta blocker therapy. She was placed on potassium last year to see if this would help her palpitations. However, she stopped taking the potassium capsule about a month ago because it was hard to swallow. Her ECG today is normal. I suspect she is still feeling PVCs. She may be more sensitive to lower levels of potassium. - Stop K+ - Start spironolactone 12.5 mg daily -  BMET, magnesium, TSH - 48 hour Holter  2. History of NSVT: Continue beta blocker. Obtain 48 hour Holter as noted  3. CAD: Nonobstructive CAD by cardiac  catheterization in 2009. Nonischemic Myoview in 2014. She notes increasing chest discomfort and shortness of breath. She carries a history of questionable sarcoid. She has some back pain that sounds musculoskeletal. It has been 2 years since her last assessment for ischemia. She does have significant risk factors for CAD. I will obtain an exercise Myoview. Check BNP today.  4. HTN: Pressure has been quite elevated recently at home. She has a headache as well. Yesterday her blood pressure was 199/102. Blood pressure is much better in the office today after taking metoprolol. Start spironolactone as noted. I have asked her to increase her metoprolol tartrate to 75 mg twice a day if her blood pressure remains elevated over the next couple of days. BMET in 1 week.  5. Hyperlipidemia: Continue statin.  6. Sarcoidosis: She is followed chronically by pulmonology (Dr. Annamaria Boots).  7. Headache: She may take Tylenol prn. This may be related to her blood pressure versus sinus congestion. Hopefully it will improve with better BP control.   8. Diastolic Dysfunction: No evidence of volume overload. But she is more short of breath. Check BNP. If high, will consider resuming Lasix QD.    Medication Adjustments/Labs and Tests Ordered: Current medicines are reviewed at length with the patient today.  Concerns regarding medicines are outlined above.  Medication changes, Labs and Tests ordered today are listed below. There are no Patient Instructions on file for this visit.    Signed, Richardson Dopp, PA-C  02/18/2015 9:26 PM    [facility] [address], [place], [place]  [address] Phone: [phone]; Fax: [phone]  This encounter was created in error - please disregard. This encounter was created in error - please  disregard. This encounter was created in error - please disregard.

## 2015-02-19 ENCOUNTER — Encounter: Payer: Medicare Other | Admitting: Physician Assistant

## 2015-02-20 ENCOUNTER — Encounter: Payer: Self-pay | Admitting: Cardiovascular Disease

## 2015-02-20 ENCOUNTER — Encounter (HOSPITAL_COMMUNITY): Payer: Self-pay | Admitting: *Deleted

## 2015-02-20 ENCOUNTER — Ambulatory Visit (INDEPENDENT_AMBULATORY_CARE_PROVIDER_SITE_OTHER): Payer: Medicare Other | Admitting: Cardiovascular Disease

## 2015-02-20 VITALS — BP 120/78 | HR 70 | Ht 62.0 in | Wt 162.4 lb

## 2015-02-20 DIAGNOSIS — I10 Essential (primary) hypertension: Secondary | ICD-10-CM

## 2015-02-20 DIAGNOSIS — R0789 Other chest pain: Secondary | ICD-10-CM

## 2015-02-20 DIAGNOSIS — R002 Palpitations: Secondary | ICD-10-CM

## 2015-02-20 MED ORDER — TETRACAINE HCL 0.5 % OP SOLN
1.0000 [drp] | OPHTHALMIC | Status: AC
  Administered 2015-02-21: 1 [drp] via OPHTHALMIC
  Filled 2015-02-20: qty 2

## 2015-02-20 MED ORDER — GATIFLOXACIN 0.5 % OP SOLN
1.0000 [drp] | OPHTHALMIC | Status: AC
  Administered 2015-02-21 (×3): 1 [drp] via OPHTHALMIC
  Filled 2015-02-20: qty 2.5

## 2015-02-20 MED ORDER — FUROSEMIDE 20 MG PO TABS: 20.0000 mg | ORAL_TABLET | ORAL | Status: DC

## 2015-02-20 NOTE — Anesthesia Preprocedure Evaluation (Addendum)
Anesthesia Evaluation  Patient identified by MRN, date of birth, ID band Patient awake    Reviewed: Allergy & Precautions, H&P , NPO status , Patient's Chart, lab work & pertinent test results, reviewed documented beta blocker date and time   Airway Mallampati: II  TM Distance: >3 FB Neck ROM: Full    Dental no notable dental hx. (+) Teeth Intact, Caps   Pulmonary sleep apnea and Continuous Positive Airway Pressure Ventilation , COPD,  COPD inhaler, former smoker,  Uses Albuterol only prn for SOB.  Does occasionally wheeze and coughs productive daily.     Pulmonary exam normal breath sounds clear to auscultation       Cardiovascular hypertension, Pt. on medications and Pt. on home beta blockers + CAD  + dysrhythmias  Rhythm:Regular Rate:Normal     Neuro/Psych  Headaches, Anxiety negative psych ROS   GI/Hepatic Neg liver ROS, GERD  Medicated and Controlled,  Endo/Other  diabetes, Well Controlled, Type 2, Oral Hypoglycemic Agents  Renal/GU negative Renal ROS  negative genitourinary   Musculoskeletal  (+) Fibromyalgia -  Abdominal   Peds  Hematology negative hematology ROS (+)   Anesthesia Other Findings Gap between upper front, but nothing loose or missing  Reproductive/Obstetrics negative OB ROS                           Anesthesia Physical Anesthesia Plan  ASA: III  Anesthesia Plan: MAC   Post-op Pain Management:    Induction: Intravenous  Airway Management Planned: Simple Face Mask  Additional Equipment:   Intra-op Plan:   Post-operative Plan:   Informed Consent: I have reviewed the patients History and Physical, chart, labs and discussed the procedure including the risks, benefits and alternatives for the proposed anesthesia with the patient or authorized representative who has indicated his/her understanding and acceptance.   Dental advisory given  Plan Discussed with:  CRNA  Anesthesia Plan Comments:         Anesthesia Quick Evaluation

## 2015-02-20 NOTE — Progress Notes (Signed)
Cardiology Office Note   Date:  02/20/2015   ID:  Sheila Reynolds [birth year], MRN [id number]  PCP:  Maximino Greenland, MD  Cardiologist:   Thayer Headings, MD   Chief Complaint  Patient presents with  . Follow-up   1. Hyperlipidemia 2. Chest pain 3. Nonsustained ventricular tachycardia 4. Mild to moderate coronary artery disease by cath in 2009. 5. 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 Keturah Barre  History of Present Illness:  Sheila Reynolds is a 70 y.o. female with the above noted hx. She has continued to have some palpitations. She has occasional episodes of lightheadedness when she has palpitations. Her main issue has been lots of total body cramps. She has been seen in the emergency room. Her lab work looked fine.  She's had lots of dyspnea especially with exertion. She has a history of sarcoidosis and is followed by Dr. Annamaria Boots.  March 11, 2012: Sheila Reynolds has had more palpitations recently. It Appears that her metoprolol dose was decreased slightly. We placed an event monitor on her. She did not have any episodes of atrial fibrillation. She has occasional drinker atrial contractions and occasional premature ventricular contractions. There were no life-threatening arrhythmias.  She is doing well from a cardiac standpoint. She's not had any episodes of syncope. She has significant shortness of breath especially when climbing stairs. This is likely due to her sarcoidosis. She denies any chest pain. Her BP is a bit elevated. She avoids salt.   May 02, 2013 :  She presented to see Richardson Dopp in February with some increased palpitations and lightheadedness. He recommended that she decrease her caffeine intake. He placed a 21 day event monitor and instructed her to take an extra metoprolol as needed.  The palpitations can occur at any time. They occur with rest and with exertion. They're not constant. The last 30-45 seconds and occur multiple times through the day.  Sept.  16, 2015:  Still having palpitations. She has had more leg edema and her medical doctor on Lasix which he now takes about every other day. She was not on potassium replacement. This may be the cause of her increased PVCs.   May 24, 2014:  Sheila Reynolds is a 70 y.o. female who presents for follow up of her palpitations   Still has these palpitations.  No syncope.   Jan. 10, 2017:  Doing ok Still has DOE  Has palpitations on occasion. Not getting much exercise .  expalined a typical exercise program .   Start slow, gradually increase    Past Medical History  Diagnosis Date  . Hyperlipidemia   . Chest pain   . Nonsustained ventricular tachycardia ([place])   . Fibromyalgia   . Coronary artery disease     Mild to moderate CAD  . Palpitations   . Hx of echocardiogram     Echo (03/2013):  Tech limited; Mild focal basal septal hypertrophy, EF 60-65%, normal RVF  . Family history of anesthesia complication     daughter N/V  . Heart murmur   . Dysrhythmia     has fluttering at times  . Shortness of breath     loses breathe at times and have to take extra breathes  . Diabetes mellitus without complication ([place])   . Hypertension   . Bronchitis with emphysema   . Anxiety     on meds  . GERD (gastroesophageal reflux disease)     on meds  . Headache(784.0)   . Anemia  3 months ago anemic  . PVC (premature ventricular contraction)     a. Holter 12/16: NSR, occ PAC,PVCs    Past Surgical History  Procedure Laterality Date  . Cardiac catheterization  05/04/2007    reveals overall normal left ventricular systolic function. Ejection fraction 65-70%  . Total abdominal hysterectomy  1982  . Breast surgery  approx 1992    bilateral breast surgery for fibrocystic breast disease  . Tonsillectomy    . Colonoscopy w/ biopsies and polypectomy    . Cataract extraction w/phaco Right 07/20/2013    Procedure: CATARACT EXTRACTION PHACO AND INTRAOCULAR LENS PLACEMENT (IOC);  Surgeon:  Marylynn Pearson, MD;  Location: [place];  Service: Ophthalmology;  Laterality: Right;  . Mini shunt insertion Right 07/20/2013    Procedure: INSERTION OF GLAUCOMA FILTRATION DEVICE RIGHT EYE;  Surgeon: Marylynn Pearson, MD;  Location: [place];  Service: Ophthalmology;  Laterality: Right;  . Mitomycin c application Right [id number]    Procedure: MITOMYCIN C APPLICATION;  Surgeon: Marylynn Pearson, MD;  Location: [place];  Service: Ophthalmology;  Laterality: Right;  . Esophagogastroduodenoscopy (egd) with propofol N/A 09/07/2014    Procedure: ESOPHAGOGASTRODUODENOSCOPY (EGD) WITH PROPOFOL;  Surgeon: Juanita Craver, MD;  Location: [facility];  Service: Endoscopy;  Laterality: N/A;  . Colonoscopy with propofol N/A 09/07/2014    Procedure: COLONOSCOPY WITH PROPOFOL;  Surgeon: Juanita Craver, MD;  Location: [facility];  Service: Endoscopy;  Laterality: N/A;     Current Outpatient Prescriptions  Medication Sig Dispense Refill  . albuterol (PROVENTIL) (2.5 MG/3ML) 0.083% nebulizer solution INHALE 2 ML(S) BY NEBULIZATION 3 TIMES A DAY AS NEEDED FOR EXACERBATIONS  1  . ALPRAZolam (XANAX) 0.5 MG tablet Take 0.25 mg by mouth at bedtime as needed for anxiety or sleep. For sleep    . aspirin EC 81 MG tablet Take 81 mg by mouth daily.    [name] atorvastatin (LIPITOR) 20 MG tablet TAKE 1 TABLET BY MOUTH EVERY DAY 30 tablet 11  . brimonidine (ALPHAGAN P) 0.1 % SOLN Place 1 drop into both eyes 2 (two) times daily. Reported on 02/15/2015    . brinzolamide (AZOPT) 1 % ophthalmic suspension Place 1 drop into both eyes 3 (three) times daily.     [name] esomeprazole (NEXIUM) 40 MG capsule Take 40 mg by mouth 2 (two) times daily as needed (reflux).     . furosemide (LASIX) 20 MG tablet Take 20 mg by mouth every 3 (three) days.     [name] JANUMET 50-500 MG per tablet Take 1 tablet by mouth Twice daily.    [name] loteprednol (LOTEMAX) 0.5 % ophthalmic suspension Place 1 drop into the left eye 2 (two) times daily as needed (inflammation).     . metoprolol  (LOPRESSOR) 50 MG tablet TAKE 1 TABLET BY MOUTH 2 TIMES DAILY. 60 tablet 11  . mometasone-formoterol (DULERA) 100-5 MCG/ACT AERO Inhale 2 puffs into the lungs 2 (two) times daily as needed for wheezing or shortness of breath.    . montelukast (SINGULAIR) 10 MG tablet Take 10 mg by mouth at bedtime.    [name] olmesartan (BENICAR) 20 MG tablet Take 20 mg by mouth daily.    . prednisoLONE acetate (PRED FORTE) 1 % ophthalmic suspension Place 1 drop into the right eye 2 (two) times daily.    . pregabalin (LYRICA) 75 MG capsule Take 75 mg by mouth at bedtime.    [name] PROAIR HFA 108 (90 BASE) MCG/ACT inhaler Inhale 2 puffs into the lungs every 6 (six) hours as needed for  wheezing.   2  . Travoprost (TRAVATAN OP) Place 1 drop into both eyes at bedtime.      No current facility-administered medications for this visit.   Facility-Administered Medications Ordered in Other Visits  Medication Dose Route Frequency Provider Last Rate Last Dose  . [START ON 02/21/2015] gatifloxacin (ZYMAXID) 0.5 % ophthalmic drops 1 drop  1 drop Right Eye On Call to OR Marylynn Pearson, MD      . Derrill Memo ON 02/21/2015] tetracaine (PONTOCAINE) 0.5 % ophthalmic solution 1 drop  1 drop Right Eye On Call to Ponce, MD        Allergies:   Crestor; Shellfish allergy; and Demerol    Social History:  The patient  reports that she quit smoking about 35 years ago. Her smoking use included Cigarettes. She has a 4 pack-year smoking history. She has never used smokeless tobacco. She reports that she does not drink alcohol or use illicit drugs.   Family History:  The patient's family history includes Cancer in her mother and paternal grandmother; Diabetes in her brother and father; Glaucoma in her brother and father; Heart disease in her father and paternal grandmother.    ROS:  Please see the history of present illness.    Review of Systems: Constitutional:  denies fever, chills, diaphoresis, appetite change and fatigue.  HEENT: denies  photophobia, eye pain, redness, hearing loss, ear pain, congestion, sore throat, rhinorrhea, sneezing, neck pain, neck stiffness and tinnitus.  Respiratory: denies SOB, DOE, cough, chest tightness, and wheezing.  Cardiovascular: denies chest pain, palpitations and leg swelling.  Gastrointestinal: denies nausea, vomiting, abdominal pain, diarrhea, constipation, blood in stool.  Genitourinary: denies dysuria, urgency, frequency, hematuria, flank pain and difficulty urinating.  Musculoskeletal: denies  myalgias, back pain, joint swelling, arthralgias and gait problem.   Skin: denies pallor, rash and wound.  Neurological: denies dizziness, seizures, syncope, weakness, light-headedness, numbness and headaches.   Hematological: denies adenopathy, easy bruising, personal or family bleeding history.  Psychiatric/ Behavioral: denies suicidal ideation, mood changes, confusion, nervousness, sleep disturbance and agitation.       All other systems are reviewed and negative.    PHYSICAL EXAM: VS:  BP 120/78 mmHg  Pulse 70  Ht 5\' 2"  (1.575 m)  Wt 162 lb 6.4 oz (73.664 kg)  BMI 29.70 kg/m2 , BMI Body mass index is 29.7 kg/(m^2). GEN: Well nourished, well developed, in no acute distress HEENT: normal Neck: no JVD, carotid bruits, or masses Cardiac: RRR; no murmurs, rubs, or gallops,no edema  Respiratory:  clear to auscultation bilaterally, normal work of breathing GI: soft, nontender, nondistended, + BS MS: no deformity or atrophy Skin: warm and dry, no rash Neuro:  Strength and sensation are intact Psych: normal   EKG:  EKG is ordered today. The ekg ordered today demonstrates NSR at 81.  Normal ECG    Recent Labs: 05/24/2014: ALT 17 12/01/2014: Hemoglobin 11.7*; Platelets 193.0 01/11/2015: Magnesium 1.6; TSH 1.019 01/19/2015: BUN 12; Creat 0.74; Potassium 4.0; Sodium 138    Lipid Panel    Component Value Date/Time   CHOL 157 05/24/2014 1650   TRIG 104.0 05/24/2014 1650   HDL 51.70  05/24/2014 1650   CHOLHDL 3 05/24/2014 1650   VLDL 20.8 05/24/2014 1650   LDLCALC 85 05/24/2014 1650      Wt Readings from Last 3 Encounters:  02/20/15 162 lb 6.4 oz (73.664 kg)  01/11/15 167 lb (75.751 kg)  12/01/14 168 lb 6.4 oz (76.386 kg)  Other studies Reviewed: Additional studies/ records that were reviewed today include: . Review of the above records demonstrates:    ASSESSMENT AND PLAN:  1. Hyperlipidemia - continue current medications. We'll check her lipids today. 2. Chest pain - stable , not a current issue. 3. Nonsustained ventricular tachycardia 4. Mild to moderate coronary artery disease by cath in 2009. 5. 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 Keturah Barre   Current medicines are reviewed at length with the patient today.  The patient does not have concerns regarding medicines.  The following changes have been made:  no change  Labs/ tests ordered today include:  No orders of the defined types were placed in this encounter.     Disposition:   FU with me in 1 year.     Signed, Sheila Reynolds, Wonda Cheng, MD  02/20/2015 12:13 PM    [facility] [address], [place], [place]  [address] Phone: [phone]; Fax: [phone]

## 2015-02-20 NOTE — Patient Instructions (Signed)
Medication Instructions:  Your physician recommends that you continue on your current medications as directed. Please refer to the Current Medication list given to you today.   Labwork: None Ordered   Testing/Procedures: None Ordered   Follow-Up: Your physician wants you to follow-up in: 1 year with Dr. [name].  You will receive a reminder letter in the mail two months in advance. If you don't receive a letter, please call our office to schedule the follow-up appointment.   If you need a refill on your cardiac medications before your next appointment, please call your pharmacy.   Thank you for choosing [facility]! Lonza Shimabukuro, RN [phone]    

## 2015-02-21 ENCOUNTER — Encounter (HOSPITAL_COMMUNITY)
Admission: RE | Disposition: A | Discharge status: Home / Self Care | Payer: Self-pay | Source: Ambulatory Visit | Attending: Ophthalmology

## 2015-02-21 ENCOUNTER — Ambulatory Visit (HOSPITAL_COMMUNITY)
Admission: RE | Admit: 2015-02-21 | Discharge: 2015-02-21 | Disposition: A | Discharge status: Home / Self Care | Payer: Medicare Other | Source: Ambulatory Visit | Attending: Ophthalmology | Admitting: Ophthalmology

## 2015-02-21 ENCOUNTER — Ambulatory Visit (HOSPITAL_COMMUNITY): Payer: Medicare Other | Admitting: Anesthesiology

## 2015-02-21 ENCOUNTER — Encounter (HOSPITAL_COMMUNITY): Payer: Self-pay | Admitting: *Deleted

## 2015-02-21 DIAGNOSIS — Z79899 Other long term (current) drug therapy: Secondary | ICD-10-CM | POA: Diagnosis not present

## 2015-02-21 DIAGNOSIS — J449 Chronic obstructive pulmonary disease, unspecified: Secondary | ICD-10-CM | POA: Diagnosis not present

## 2015-02-21 DIAGNOSIS — Z961 Presence of intraocular lens: Secondary | ICD-10-CM | POA: Diagnosis not present

## 2015-02-21 DIAGNOSIS — H43813 Vitreous degeneration, bilateral: Secondary | ICD-10-CM | POA: Insufficient documentation

## 2015-02-21 DIAGNOSIS — D869 Sarcoidosis, unspecified: Secondary | ICD-10-CM | POA: Diagnosis not present

## 2015-02-21 DIAGNOSIS — E1139 Type 2 diabetes mellitus with other diabetic ophthalmic complication: Secondary | ICD-10-CM | POA: Diagnosis not present

## 2015-02-21 DIAGNOSIS — Z7982 Long term (current) use of aspirin: Secondary | ICD-10-CM | POA: Insufficient documentation

## 2015-02-21 DIAGNOSIS — F419 Anxiety disorder, unspecified: Secondary | ICD-10-CM | POA: Diagnosis not present

## 2015-02-21 DIAGNOSIS — Z87891 Personal history of nicotine dependence: Secondary | ICD-10-CM | POA: Insufficient documentation

## 2015-02-21 DIAGNOSIS — J45909 Unspecified asthma, uncomplicated: Secondary | ICD-10-CM | POA: Insufficient documentation

## 2015-02-21 DIAGNOSIS — M797 Fibromyalgia: Secondary | ICD-10-CM | POA: Diagnosis not present

## 2015-02-21 DIAGNOSIS — G4730 Sleep apnea, unspecified: Secondary | ICD-10-CM | POA: Diagnosis not present

## 2015-02-21 DIAGNOSIS — I10 Essential (primary) hypertension: Secondary | ICD-10-CM | POA: Diagnosis not present

## 2015-02-21 DIAGNOSIS — D649 Anemia, unspecified: Secondary | ICD-10-CM | POA: Diagnosis not present

## 2015-02-21 DIAGNOSIS — K219 Gastro-esophageal reflux disease without esophagitis: Secondary | ICD-10-CM | POA: Diagnosis not present

## 2015-02-21 DIAGNOSIS — H4043X3 Glaucoma secondary to eye inflammation, bilateral, severe stage: Secondary | ICD-10-CM | POA: Diagnosis not present

## 2015-02-21 DIAGNOSIS — H409 Unspecified glaucoma: Secondary | ICD-10-CM | POA: Diagnosis not present

## 2015-02-21 DIAGNOSIS — I2510 Atherosclerotic heart disease of native coronary artery without angina pectoris: Secondary | ICD-10-CM | POA: Insufficient documentation

## 2015-02-21 DIAGNOSIS — Z7984 Long term (current) use of oral hypoglycemic drugs: Secondary | ICD-10-CM | POA: Insufficient documentation

## 2015-02-21 HISTORY — PX: MITOMYCIN C APPLICATION: SHX6375

## 2015-02-21 HISTORY — PX: TRABECULECTOMY: SHX107

## 2015-02-21 LAB — BASIC METABOLIC PANEL
ANION GAP: 9 (ref 5–15)
BUN: 11 mg/dL (ref 6–20)
CALCIUM: 9.2 mg/dL (ref 8.9–10.3)
CHLORIDE: 106 mmol/L (ref 101–111)
CO2: 24 mmol/L (ref 22–32)
Creatinine, Ser: 0.72 mg/dL (ref 0.44–1.00)
GFR calc non Af Amer: >60 mL/min (ref >60)
Glucose, Bld: 114 mg/dL — ABNORMAL HIGH (ref 65–99)
Potassium: 4.1 mmol/L (ref 3.5–5.1)
SODIUM: 139 mmol/L (ref 135–145)

## 2015-02-21 LAB — CBC
HCT: 33.5 % — ABNORMAL LOW (ref 36.0–46.0)
HEMOGLOBIN: 10.9 g/dL — AB (ref 12.0–15.0)
MCH: 27.5 pg (ref 26.0–34.0)
MCHC: 32.5 g/dL (ref 30.0–36.0)
MCV: 84.4 fL (ref 78.0–100.0)
PLATELETS: 210 10*3/uL (ref 150–400)
RBC: 3.97 MIL/uL (ref 3.87–5.11)
RDW: 14 % (ref 11.5–15.5)
WBC: 8.4 10*3/uL (ref 4.0–10.5)

## 2015-02-21 LAB — GLUCOSE, CAPILLARY
Glucose-Capillary: 107 mg/dL — ABNORMAL HIGH (ref 65–99)
Glucose-Capillary: 90 mg/dL (ref 65–99)

## 2015-02-21 SURGERY — TRABECULECTOMY
Anesthesia: Monitor Anesthesia Care | Site: Eye | Laterality: Right

## 2015-02-21 MED ORDER — FLUORESCEIN SODIUM 1 MG OP STRP
ORAL_STRIP | OPHTHALMIC | Status: AC
  Filled 2015-02-21: qty 1

## 2015-02-21 MED ORDER — TOBRAMYCIN-DEXAMETHASONE 0.3-0.1 % OP OINT
TOPICAL_OINTMENT | OPHTHALMIC | Status: AC
  Filled 2015-02-21: qty 3.5

## 2015-02-21 MED ORDER — PROPOFOL 500 MG/50ML IV EMUL
INTRAVENOUS | Status: DC | PRN
  Administered 2015-02-21: 25 ug/kg/min via INTRAVENOUS

## 2015-02-21 MED ORDER — BSS IO SOLN
INTRAOCULAR | Status: DC | PRN
Start: 2015-02-21 — End: 2015-02-21

## 2015-02-21 MED ORDER — PROPOFOL 10 MG/ML IV BOLUS
INTRAVENOUS | Status: DC | PRN
  Administered 2015-02-21: 50 mg via INTRAVENOUS

## 2015-02-21 MED ORDER — ATROPINE SULFATE 1 % OP SOLN
OPHTHALMIC | Status: AC
  Filled 2015-02-21: qty 5

## 2015-02-21 MED ORDER — FLUORESCEIN SODIUM 1 MG OP STRP
ORAL_STRIP | OPHTHALMIC | Status: DC | PRN
  Administered 2015-02-21: 1 via OPHTHALMIC

## 2015-02-21 MED ORDER — PILOCARPINE HCL 4 % OP SOLN
OPHTHALMIC | Status: AC
  Filled 2015-02-21: qty 15

## 2015-02-21 MED ORDER — EPINEPHRINE HCL 1 MG/ML IJ SOLN
INTRAOCULAR | Status: DC | PRN
  Administered 2015-02-21: 500 mL

## 2015-02-21 MED ORDER — MIDAZOLAM HCL 5 MG/5ML IJ SOLN
INTRAMUSCULAR | Status: DC | PRN
  Administered 2015-02-21: 1 mg via INTRAVENOUS

## 2015-02-21 MED ORDER — 0.9 % SODIUM CHLORIDE (POUR BTL) OPTIME: TOPICAL | Status: DC | PRN

## 2015-02-21 MED ORDER — FENTANYL CITRATE (PF) 100 MCG/2ML IJ SOLN
INTRAMUSCULAR | Status: DC | PRN
  Administered 2015-02-21: 25 ug via INTRAVENOUS
  Administered 2015-02-21: 50 ug via INTRAVENOUS

## 2015-02-21 MED ORDER — TOBRAMYCIN 0.3 % OP OINT
TOPICAL_OINTMENT | OPHTHALMIC | Status: DC | PRN
Start: 2015-02-21 — End: 2015-02-21
  Administered 2015-02-21: 1 via OPHTHALMIC

## 2015-02-21 MED ORDER — FENTANYL CITRATE (PF) 100 MCG/2ML IJ SOLN
INTRAMUSCULAR | Status: AC
  Filled 2015-02-21: qty 2

## 2015-02-21 MED ORDER — ACETYLCHOLINE CHLORIDE 20 MG IO SOLR
INTRAOCULAR | Status: AC
  Filled 2015-02-21: qty 1

## 2015-02-21 MED ORDER — BSS IO SOLN
INTRAOCULAR | Status: AC
  Filled 2015-02-21: qty 500

## 2015-02-21 MED ORDER — HYDROCODONE-ACETAMINOPHEN 5-325 MG PO TABS
ORAL_TABLET | ORAL | Status: AC
  Administered 2015-02-21: 2
  Filled 2015-02-21: qty 2

## 2015-02-21 MED ORDER — ONDANSETRON HCL 4 MG/2ML IJ SOLN
INTRAMUSCULAR | Status: DC | PRN
  Administered 2015-02-21: 4 mg via INTRAVENOUS

## 2015-02-21 MED ORDER — ONDANSETRON HCL 4 MG/2ML IJ SOLN
INTRAMUSCULAR | Status: AC
  Filled 2015-02-21: qty 2

## 2015-02-21 MED ORDER — LIDOCAINE-EPINEPHRINE 2 %-1:100000 IJ SOLN
INTRAMUSCULAR | Status: DC | PRN
  Administered 2015-02-21: 5 mL via RETROBULBAR

## 2015-02-21 MED ORDER — LACTATED RINGERS IV SOLN
INTRAVENOUS | Status: DC | PRN
  Administered 2015-02-21: 08:00:00 via INTRAVENOUS

## 2015-02-21 MED ORDER — TRIAMCINOLONE ACETONIDE 40 MG/ML IJ SUSP
INTRAMUSCULAR | Status: DC | PRN
  Administered 2015-02-21: 40 mg

## 2015-02-21 MED ORDER — ATROPINE SULFATE 1 % OP SOLN
OPHTHALMIC | Status: DC | PRN
  Administered 2015-02-21: 1 [drp] via OPHTHALMIC

## 2015-02-21 MED ORDER — BSS IO SOLN
INTRAOCULAR | Status: AC
  Filled 2015-02-21: qty 15

## 2015-02-21 MED ORDER — SODIUM HYALURONATE 10 MG/ML IO SOLN
INTRAOCULAR | Status: DC | PRN
  Administered 2015-02-21: 0.85 mL via INTRAOCULAR

## 2015-02-21 MED ORDER — EPINEPHRINE HCL 1 MG/ML IJ SOLN
INTRAMUSCULAR | Status: AC
  Filled 2015-02-21: qty 1

## 2015-02-21 MED ORDER — TETRACAINE HCL 0.5 % OP SOLN
OPHTHALMIC | Status: AC
  Filled 2015-02-21: qty 2

## 2015-02-21 MED ORDER — FENTANYL CITRATE (PF) 100 MCG/2ML IJ SOLN
25.0000 ug | INTRAMUSCULAR | Status: DC | PRN
  Administered 2015-02-21: 25 ug via INTRAVENOUS

## 2015-02-21 MED ORDER — ACETAMINOPHEN-CODEINE #3 300-30 MG PO TABS: 1.0000 | ORAL_TABLET | ORAL | Status: DC | PRN

## 2015-02-21 MED ORDER — LIDOCAINE HCL 2 % IJ SOLN: INTRAMUSCULAR | Status: DC | PRN

## 2015-02-21 MED ORDER — SODIUM CHLORIDE 0.9 % IR SOLN
Status: DC | PRN
  Administered 2015-02-21: 1000 mL

## 2015-02-21 MED ORDER — FENTANYL CITRATE (PF) 250 MCG/5ML IJ SOLN
INTRAMUSCULAR | Status: AC
  Filled 2015-02-21: qty 5

## 2015-02-21 MED ORDER — MIDAZOLAM HCL 2 MG/2ML IJ SOLN
INTRAMUSCULAR | Status: AC
  Filled 2015-02-21: qty 2

## 2015-02-21 MED ORDER — MITOMYCIN 0.2 MG OP KIT
PACK | OPHTHALMIC | Status: DC | PRN
  Administered 2015-02-21: .4 mL via OPHTHALMIC

## 2015-02-21 MED ORDER — BUPIVACAINE HCL (PF) 0.75 % IJ SOLN
INTRAMUSCULAR | Status: AC
  Filled 2015-02-21: qty 10

## 2015-02-21 MED ORDER — TRIAMCINOLONE ACETONIDE 40 MG/ML IJ SUSP
INTRAMUSCULAR | Status: AC
  Filled 2015-02-21: qty 5

## 2015-02-21 MED ORDER — LIDOCAINE HCL 2 % IJ SOLN
INTRAMUSCULAR | Status: AC
  Filled 2015-02-21: qty 20

## 2015-02-21 MED ORDER — LIDOCAINE-EPINEPHRINE 2 %-1:100000 IJ SOLN
INTRAMUSCULAR | Status: AC
  Filled 2015-02-21: qty 1

## 2015-02-21 MED ORDER — HYALURONIDASE HUMAN 150 UNIT/ML IJ SOLN
INTRAMUSCULAR | Status: AC
  Filled 2015-02-21: qty 1

## 2015-02-21 MED ORDER — PROPOFOL 10 MG/ML IV BOLUS
INTRAVENOUS | Status: AC
  Filled 2015-02-21: qty 40

## 2015-02-21 MED ORDER — ACETYLCHOLINE CHLORIDE 20 MG IO SOLR
INTRAOCULAR | Status: DC | PRN
  Administered 2015-02-21: 10 mg via INTRAOCULAR

## 2015-02-21 MED ORDER — SODIUM HYALURONATE 10 MG/ML IO SOLN
INTRAOCULAR | Status: AC
  Filled 2015-02-21: qty 0.85

## 2015-02-21 SURGICAL SUPPLY — 43 items
APL SRG 3 HI ABS STRL LF PLS (MISCELLANEOUS) ×1
APPLICATOR COTTON TIP 6IN STRL (MISCELLANEOUS) ×2 IMPLANT
APPLICATOR DR MATTHEWS STRL (MISCELLANEOUS) ×2 IMPLANT
BAG FLD CLT MN 6.25X3.5 (WOUND CARE) ×1
BAG MINI COLL DRAIN (WOUND CARE) ×1 IMPLANT
BLADE EYE CATARACT 19 1.4 BEAV (BLADE) ×2 IMPLANT
BLADE MINI RND TIP GREEN BEAV (BLADE) IMPLANT
BLADE STAB KNIFE 45DEG (BLADE) ×2 IMPLANT
CANISTER SUCTION 2500CC (MISCELLANEOUS) ×1 IMPLANT
CANNULA ANT/CHMB 27G (MISCELLANEOUS) ×1 IMPLANT
CANNULA ANT/CHMB 27GA (MISCELLANEOUS) ×2 IMPLANT
CORDS BIPOLAR (ELECTRODE) ×2 IMPLANT
COVER MAYO STAND STRL (DRAPES) IMPLANT
DRAPE OPHTHALMIC 40X48 W POUCH (DRAPES) ×2 IMPLANT
DRAPE RETRACTOR (MISCELLANEOUS) ×2 IMPLANT
ERASER HMR WETFIELD 23G BP (MISCELLANEOUS) ×1 IMPLANT
GLOVE BIO SURGEON STRL SZ8 (GLOVE) ×2 IMPLANT
GLOVE BIOGEL PI IND STRL 8 (GLOVE) IMPLANT
GLOVE BIOGEL PI INDICATOR 8 (GLOVE) ×2
GLOVE ECLIPSE 7.0 STRL STRAW (GLOVE) ×2 IMPLANT
GLOVE SURG SS PI 8.0 STRL IVOR (GLOVE) ×1 IMPLANT
GOWN STRL REIN XL XLG (GOWN DISPOSABLE) ×7 IMPLANT
KIT BASIN OR (CUSTOM PROCEDURE TRAY) ×2 IMPLANT
KIT ROOM TURNOVER OR (KITS) ×2 IMPLANT
KNIFE GRIESHABER SHARP 2.5MM (MISCELLANEOUS) ×2 IMPLANT
NDL 25GX 5/8IN NON SAFETY (NEEDLE) ×1 IMPLANT
NDL FILTER BLUNT 18X1 1/2 (NEEDLE) ×1 IMPLANT
NEEDLE 25GX 5/8IN NON SAFETY (NEEDLE) ×4 IMPLANT
NEEDLE FILTER BLUNT 18X 1/2SAF (NEEDLE) ×1
NEEDLE FILTER BLUNT 18X1 1/2 (NEEDLE) ×1 IMPLANT
NS IRRIG 1000ML POUR BTL (IV SOLUTION) ×2 IMPLANT
PACK CATARACT CUSTOM (CUSTOM PROCEDURE TRAY) ×2 IMPLANT
PAD ARMBOARD 7.5X6 YLW CONV (MISCELLANEOUS) ×4 IMPLANT
PAK PIK CVS CATARACT (OPHTHALMIC) ×2 IMPLANT
SUT ETHILON 10 0 CS140 6 (SUTURE) ×2 IMPLANT
SUT ETHILON 9 0 BV100 4 (SUTURE) ×2 IMPLANT
SUT SILK 6 0 G 6 (SUTURE) ×2 IMPLANT
SYR 20CC LL (SYRINGE) ×2 IMPLANT
SYR 50ML SLIP (SYRINGE) ×2 IMPLANT
SYR TB 1ML LUER SLIP (SYRINGE) ×1 IMPLANT
TIP ABS 45DEG FLARED 0.9MM (TIP) ×2 IMPLANT
TUBE CONNECTING 12X1/4 (SUCTIONS) ×1 IMPLANT
WATER STERILE IRR 1000ML POUR (IV SOLUTION) ×2 IMPLANT

## 2015-02-21 NOTE — Transfer of Care (Signed)
Immediate Anesthesia Transfer of Care Note  Patient: Sheila Reynolds  Procedure(s) Performed: Procedure(s): TRABECULECTOMY WITH MMC ON THE RIGHT EYE (Right) MITOMYCIN C APPLICATION RIGHT EYE (Right)  Patient Location: PACU  Anesthesia Type:MAC  Level of Consciousness: awake and patient cooperative  Airway & Oxygen Therapy: Patient Spontanous Breathing  Post-op Assessment: Report given to RN, Post -op Vital signs reviewed and stable, Patient moving all extremities and Pt stood for transfer to chair in PACU  Post vital signs: Reviewed and stable  Last Vitals:  Filed Vitals:   02/21/15 0658  BP: 153/71  Pulse: 66  Temp: 36.6 C  Resp: 20    Complications: No apparent anesthesia complications

## 2015-02-21 NOTE — Anesthesia Postprocedure Evaluation (Signed)
Anesthesia Post Note  Patient: Sheila Reynolds  Procedure(s) Performed: Procedure(s) (LRB): TRABECULECTOMY WITH MMC ON THE RIGHT EYE (Right) MITOMYCIN C APPLICATION RIGHT EYE (Right)  Patient location during evaluation: PACU Anesthesia Type: MAC Level of consciousness: awake and alert Pain management: pain level controlled Vital Signs Assessment: post-procedure vital signs reviewed and stable Respiratory status: spontaneous breathing, nonlabored ventilation and respiratory function stable Cardiovascular status: stable and blood pressure returned to baseline Anesthetic complications: no    Last Vitals:  Filed Vitals:   02/21/15 0658 02/21/15 1042  BP: 153/71 142/85  Pulse: 66 58  Temp: 36.6 C 36.7 C  Resp: 20 21    Last Pain: There were no vitals filed for this visit.               Lillyonna Armstead,W. EDMOND

## 2015-02-21 NOTE — H&P (View-Only) (Signed)
History & Physical:   DATE:   02-14-15  NAMEMarland Reynolds  Sheila Reynolds     [id number]       HISTORY OF PRESENT ILLNESS: Referred by Dr Ricki Miller    Chief Eye Complaints glaucoma eye pain    Va seems to be about the same. patient states OD eyelid seems to be closing, and she cant really see out of it.  patient  states that she sees dead spots in OD, and if its dark she cant see.     HPI: EYES: Reports symptoms of visual floaters or spots, vision disturbances.   OD      LOCATION: BOTH EYES        QUALITY/COURSE:   Reports condition is worsening.        INTENSITY/SEVERITY:    Mild   Reports measurement ( or degree) as        DURATION:   Reports the general length of symptoms to be years.             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   ICD10:   ICD9: 250.50  Onset: 05/03/2013 10:03   Sarcoidosis   ICD10: D86.9  ICD9: 135  Onset: 05/03/2013 10:04   Posterior subcapsular polar senile cataract   ICD10:   ICD9: 366.14  Onset: 05/03/2013 10:53  OD  SURGERIES: 07/20/13 Pseudophakia   ICD10: Z96.1 ICD9: 446.0 Onset: 07/21/2013 16:44  glaucoma sx with expressed shunt and mito-C  OD  Pick List - Surgeries  MEDICATIONS: Janumet: Strength-  SIG-    Lyrica (Pregabalin):   50 mg capsule  SIG-  1 cap(s)   2 times a day   Metoprolol  (Lopressor):   50 mg tablet  SIG-  1 each   2 times a day   Hydrochlorothiazide (HCTZ):   12.5 mg capsule  SIG-  1 each   once a day   Lipitor (Atorvastatin):   40 mg tablet  SIG-  1 tab(s)   once a day (at bedtime)    Singulair (Montelukast):   4 mg granule  SIG-  1 tab(s)   once a day (in the evening)   Aspirin:  81 mg tablet  SIG-  1 each   once a day   Dulera: Strength-  SIG-    Ventolin: Strength-  SIG-  Dose-  Freq-    Azopt: 1% suspension SIG-  1 gtt in both eyes TID   Travatan Z: 0.004% solution SIG-  1 gtt OU QH    Alphagan P: Strength-  SIG-  1 gtt in each affected eye 3 times a day for 30 days OU  Lotemax  Lotemax: 0.5% gel SIG-  1 gtt OU PRN    Durezol: 0.05% emulsion SIG-  1 drop in OD 6 times a day   Ilevro: 0.3% suspension SIG-  1 gtt in each affected eye once a day for 14 days  REVIEW OF SYSTEMS: ROS:  GEN- Constitutional: HENT: +++++      cough LUNGS/Respiratory:  +++++      cough HEART/Cardiovascular: Reports symptoms of palpitations.    ABD/Gastrointestinal:  Musculoskeletal (BJE): +++++      arthralgias    muscle pain    foot/toe pain or problems   spasm  NEURO/Neurological: PSYCH/Psychiatric:    Is the pt oriented to time, place,yes person? yes  Mood depressed  normal   TOBACCO:Smoker Status:   Former Smoker. ICD10: Z87.891 ICD9: V15.82 Onset: 05/03/2013 09:34 Initial Date:      SOCIAL HISTORY: retired  Artist  FAMILY HISTORY: Positive family history for  -   Glaucoma:;   Diabetes - Type 2:   Negative family history for  -   PARENTS: Glaucoma:;   Cancer:  father went blind  Family History - 1st Degree Relatives:  Mother dead.  Father is dead.  ALLERGIES: Beta blockers  Crestor Severity-  Onset-  Reaction-  Status- Active Type- Drug allergy Date Changed-   Oyster Shell Calcium 500:     PHYSICAL EXAMINATION: VS: BMI: 31.2.  BP: 177/90.  H: 62.00 in.  P: 65 /min.  W: 170lbs 0oz.    Va    OD: cc 20/40 OS: cc 20/30+1  EYEGLASSES:  OD: +1.00 +1.75 x174                                               OS: +1.50 +1.00 x001 ADD: +2.00  Auto Refraction05/01/2014 10:27  OD: -0.25 +0.75  x001 OS: -1.00 +1.25 x109  K'S05/01/2014 10:28   OD: 44.50 45.50  OS: 44.75  45.50   MR 06/21/2013 10:27   OD: +1.00 +1.75 x174 NI OS: +1.00 +1.00 x001 20/30 ADD   VF:  OD small island of vision                                               OS  Motility orthophoria and full  PUPILS: 3 mm round reactive negative Marcus Gunn  EYELIDS & OCULAR ADNEXAptosis OD guarding ptosis OS  SLE: Conjunctiva quiet OD, plus one injection, OS  Cornea arcus each eye   anterior chamber  deep & quiet OD,      trace cell & flare  OS  Iris Brown each eye no rubeosis either eye  Lens Plus one nuclear sclerosis each eye,  +2 posterior subcapsular opacity OD  Vitreous posterior vitreous detachment each eye  Ta   in mmHg    OD 26     OS 24,25 Time 06/21/2013 11:25  Dilation tropicamide  Fundus:  optic nerve  OD   inferior rim loss                                                                  OS 70% cup optic nerve intact   Macula      OD  irregular light reflex  OS normal light reflex  Vessels perivascular sheathing  Periphery normal     Exam: GENERAL: Appearance: HEAD, EARS, NOSE AND THROAT: Ears-Nose (external) Inspection: Externally, nose and ears are normal in appearance and without scars, lesions, or nodules.      Hearing assessment shows no problems with normal conversation.      LUNGS and RESPIRATORY: Lung auscultation elicits no wheezing, rhonci, rales or rubs and with equal breath sounds.    Respiratory effort described as breathing is unlabored and chest movement is symmetrical.    HEART (Cardiovascular): Heart auscultation discovers regular rate and rhythm; no murmur, gallop or rub. Normal heart sounds.    ABDOMEN (Gastrointestinal): Mass/Tenderness Exam: Neither are present.     MUSCULOSKELETAL (BJE): Inspection-Palpation: No major bone, joint, tendon, or muscle changes.      NEUROLOGICAL: Alert and oriented. No major deficits of coordination or sensation.       PSYCHIATRIC: Insight and judgment appear  both to be intact and appropriate.    Mood and affect are described as normal mood and full affect.    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 Venetia Maxon explained the  Risk and benefits of surgery have been reviewed with the patient and the patient agrees to proceed with the surgical procedure. he also explained the possibility of loosing her sight is greater without the surgery.     Visual Fields  & OCT TODAY  Handouts: glaucoma , what is glaucoma?, What is a cataract?, New Handout, glaucoma treatment, glaucoma , what is glaucoma?, What is a cataract?, New Handout, glaucoma treatment, Erythromycins, Insect Bites/stings, glaucoma , what is glaucoma?, New Handout, glaucoma treatment, What is a cataract?, Erythromycins, Insect Bites/stings, glaucoma , what is glaucoma?, New Handout, glaucoma treatment, What is a cataract?.    ___________________________ Marylynn Pearson, Brooke Bonito Starter - Inactive Problems:

## 2015-02-21 NOTE — Progress Notes (Signed)
Pt dressed. Up to bathroom without difficulty. Eye is "not hurting at all right now". Sats 100% on room air. A&O X4. D/c'd to home, accompanied by son & daughter.

## 2015-02-21 NOTE — Discharge Instructions (Signed)
The patient should leave the eye patch on the right eye until seen by the Dr. Marylene Buerger. The patient can sleep on back or left side avoid rubbing the right eye And avoid heavy lifting bending or straining. The patient should continue previous eyedrops left eye only.

## 2015-02-21 NOTE — Interval H&P Note (Signed)
History and Physical Interval Note:  02/21/2015 8:32 AM  Sheila Reynolds  has presented today for surgery, with the diagnosis of glaucoma  The various methods of treatment have been discussed with the patient and family. After consideration of risks, benefits and other options for treatment, the patient has consented to  Procedure(s): TRABECULECTOMY WITH MMC ON THE RIGHT EYE (Right) MITOMYCIN C APPLICATION RIGHT EYE (Right) as a surgical intervention .  The patient's history has been reviewed, patient examined, no change in status, stable for surgery.  I have reviewed the patient's chart and labs.  Questions were answered to the patient's satisfaction.     Jisele Price

## 2015-02-21 NOTE — Op Note (Signed)
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 The patient's face was then prepped and draped in the usual sterile fashion. With the surgeon sitting at the 12:00 position and operating microscope in position a muscle hook was used to infraducted the eye and a 6-0 nylon suture was passed through clear cornea to infraducted the eye  Following this an incision was made at the superior temporal limbus because of previous scarring of the superior nasal limbus from pre-existing scarring from previous cataract and glaucoma surgery right eye. Using a blunt [place] scissor a fornix-based conjunctival flap was formed the [place] blade was used to recess T9 fibers and bleeding was controlled with cautery. A scleral flap with the base of 4 mm at the limbus was fashioned using 45 blade.  The [place] Col[place] forceps were used to elevate the flap and agrees however blade was used to dissect to the corneal scleral limbus. Following this mitomycin-C 0.4 mg/mL was placed on 3 Gelfoam sponges and allowed to stay under the conjunctiva for 4 minutes the sponges were all removed and eye was irrigated with 30 mL of balanced salt solution area prior to applying the mitomycin-C a paracentesis tract was made at the 7:30 position and Provisc was injected in the anterior chamber  Area the scleral flap was elevated a 45 blade was used into the anterior chamber under the scleral flap a 1 x 3 block of tissue was removed a basilar peripheral iridectomy was performed using the Chandler forceps and the Vannas scissors. Cautery was  applied to the scleral flap to be certain that all iris tissue would not obstruct the sclerostomy. Following this a single 10-0 nylon suture was placed to secure the scleral flap and then 2 interrupted releasable sutures were placed on each side of the scleral flap  Following this the conjunctiva was closed with a 9-0 nylon suture on a BV 100 needle BSS was injected in the anterior chamber the incision was stain and noted to be Seidel negative therefore a subconjunctival injection of Kenalog 4 mg is given in the inferior temporal subconjunctival space topical atropine and topical Tobrex ointment was applied to the eye a patch and [employer] were placed and the patient returned to recovery area stable condition. Marylynn Pearson Junior M.D.

## 2015-02-22 ENCOUNTER — Encounter (HOSPITAL_COMMUNITY): Payer: Self-pay | Admitting: Ophthalmology

## 2015-03-14 DIAGNOSIS — I119 Hypertensive heart disease without heart failure: Secondary | ICD-10-CM | POA: Diagnosis not present

## 2015-03-14 DIAGNOSIS — E1165 Type 2 diabetes mellitus with hyperglycemia: Secondary | ICD-10-CM | POA: Diagnosis not present

## 2015-03-14 DIAGNOSIS — F419 Anxiety disorder, unspecified: Secondary | ICD-10-CM | POA: Diagnosis not present

## 2015-03-14 DIAGNOSIS — E559 Vitamin D deficiency, unspecified: Secondary | ICD-10-CM | POA: Diagnosis not present

## 2015-03-14 DIAGNOSIS — I2510 Atherosclerotic heart disease of native coronary artery without angina pectoris: Secondary | ICD-10-CM | POA: Diagnosis not present

## 2015-03-19 ENCOUNTER — Encounter: Payer: Self-pay | Admitting: Internal Medicine

## 2015-03-19 ENCOUNTER — Ambulatory Visit (INDEPENDENT_AMBULATORY_CARE_PROVIDER_SITE_OTHER): Payer: Medicare Other | Admitting: Internal Medicine

## 2015-03-19 VITALS — BP 118/76 | HR 81 | Ht 62.0 in | Wt 161.8 lb

## 2015-03-19 DIAGNOSIS — J42 Unspecified chronic bronchitis: Secondary | ICD-10-CM | POA: Diagnosis not present

## 2015-03-19 DIAGNOSIS — D869 Sarcoidosis, unspecified: Secondary | ICD-10-CM

## 2015-03-19 DIAGNOSIS — R918 Other nonspecific abnormal finding of lung field: Secondary | ICD-10-CM | POA: Diagnosis not present

## 2015-03-19 DIAGNOSIS — Z23 Encounter for immunization: Secondary | ICD-10-CM

## 2015-03-19 MED ORDER — FLUTICASONE FUROATE-VILANTEROL 100-25 MCG/INH IN AEPB: 1.0000 | INHALATION_SPRAY | Freq: Every day | RESPIRATORY_TRACT | Status: DC

## 2015-03-19 NOTE — Progress Notes (Signed)
02/05/11- 70 yoF former smoker seen on referral from Dr Estanislado Pandy for concern of sarcoid. 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 Shortness of breath has been chronic with one flight of stairs. She thinks she may be gradually getting worse. There is chronic dry cough which is been worse since a flulike illness 3 weeks ago. 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 I can't tell a specific arthritis diagnosis has been made. There is no history of liver or kidney disease. Bronchitis was diagnosed at the time of allergy skin testing a year ago. Food sensitivity to shrimp and crab which make her lips itch. Positive skin test also for grass and house dust. She has not been on allergy vaccine. A rescue inhaler has helped at times. 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 Advair was no help and Symbicort may have helped only a little. ACE 46 (8-52) 09/30/10 Medication choices been limited by glaucoma. No history of TB exposure. Past history of sinus infections. Active problem with heartburn which is partly suppressed with Nexium once or twice daily/ Dr Collene Mares. Married, now on short-term disability. Quit smoking 1980 Several family members on her father's side had sarcoid.  03/10/11- 70 yoF former smoker followed for bronchitis, hx sarcoid, complicated by GERD, glaucoma Dulera inhaler did not have significant improvement. She is off of methotrexate and prednisone as of this month. 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 She occasionally wakes coughing with thick white mucus, stress incontinence. Just occasionally tight. DOE with one flight of stairs and has to stop at the top to get her breath but  no longer has to catch her breath halfway down stairs carrying laundry. Tussionex no longer works as well for cough as it used to.  07/08/11- 70 yoF former smoker followed for bronchitis, hx sarcoid, complicated by GERD, glaucoma Iincreased cough and congestion(nasal and chest)Productive-green/yellow in color x 3 days. Denies any fever but has been having chills. SOB and wheezing along with cough. SOB is all the time. Need to check if okay to start back on Methtrexate again. Dr Estanislado Pandy has been following for sarcoid of the eye and wants to resume methotrexate. There has been increased cough, productive of  mucus, worse at night and supine. Maybe some chills. Stress incontinence. Arthralgias. Postnasal drip. GERD is fairly well controlled with Nexium. Taking tramadol now for pain so is not helping the cough. Failed benzonatate. This was a prolonged visit 40 minutes. CXR 02/09/11-images reviewed with her. IMPRESSION:  Stable examination. No active cardiopulmonary process.  Original Report Authenticated By: Vivia Ewing, M.D.    10/09/11- 70 yoF former smoker followed for bronchitis, hx sarcoid, complicated by GERD, glaucoma Cough-worse at night-yellow in color; drainage in throat; SOB and wheezing on bad days Active sarcoid uveitis- Dr Brewington/ Opth, Deveshwar/ Rheum, [facility].  Will see Dr.Deveshwar next month for decision about restarting methotrexate. Persistent pain mid anterior chest through to back, worse with prolonged standing and kitchen, eased by leaning over. No reflux or dysphagia. Expects seasonal bronchitis but denies routine cough or dyspnea, fever or sweats. CXR 07/10/11- negative. ACE 07/08/11- 40/ WNL.  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   FOLLOWS FOR: still having problems with SOB with activity. No change in complaints of pain, mid anterior chest, left greater than right mostly around into her back and with prolonged standing she has to stop kitchen work to sit down. We discussed this as more of a back problem and a chest problem. She has seen Dr.Van Creed Copper w/ [facility] . Dr. Estanislado Pandy is treating with ibuprofen.  07/08/12- 70 yoF former smoker followed for bronchitis, hx sarcoid, complicated by GERD, glaucoma FOLLOWS FOR: went for ROV at [facility]-was told allergies were flared up and Deveshwar MD stated Sarcoid was flared up as well. Was given Rx's(currently on Prednisone taper) continues to cough-green and yellow with streaks of blood at times. (Dr Creed Copper) Complains of backache. Rhinitis is responded to nasal spray, Singulair, Allegra. Dr.Deveshwar feels for sarcoid might be flaring. She prescribed prednisone, methotrexate, folic acid. ACE level 07/08/11- 40 (8-52) CT chest 05/19/12 IMPRESSION:  . No explanation for the patient's history of back pain.  2. Despite the reported clinical history of sarcoidosis, there are  no imaging findings to suggest a diagnosis of sarcoidosis at this  time in this patient. Clinical correlation is recommended.  3. Mild centrilobular emphysema.  4. Atherosclerosis, including left anterior descending coronary  artery disease. Please note that although the presence of coronary  artery calcium documents the presence of coronary artery disease,  the severity of this disease and any potential stenosis cannot be  assessed on this non-gated CT examination. Assessment for  potential risk factor modification, dietary therapy or  pharmacologic therapy may be warranted, if clinically indicated.  5. Small pulmonary nodules in the lungs, as above. The largest of  these is a 6 mm ground-glass attenuation nodule in the right upper  lobe (image 16 of series 3). Initial follow-up by  chest CT without  contrast is recommended in 3 months to confirm persistence. This  recommendation follows the consensus statement: Recommendations for  the Management of Subsolid Pulmonary Nodules Detected at CT: A  Statement from the Cook as published in Radiology  2013; 266:304-317.  Original Report Authenticated By: Vinnie Langton, M.D.  08/19/12- 70 yoF former smoker followed for bronchitis, hx sarcoid, complicated by GERD, glaucoma follows for:  discuss ct results  Occasional sweats, minor rash at right ankle, cough productive white, thick and occasionally yellow sputum. Some dyspnea with exertion, no chest pain or palpitation. ACE 07/08/12- 27 (8-52) CT chest 08/18/12 IMPRESSION:  1. New clustered peribronchovascular ill-defined nodularity in the  right upper lobe, indicative of an infectious bronchiolitis.  2. Previously measured 6 mm right upper lobe nodule is less  discrete on the current examination.  3. Coronary artery calcification.  Original Report Authenticated By: Lorin Picket, M.D.  02/25/13- 15 yoF former smoker followed for bronchitis/ bronchiolitis/ nodules, hx  Ocular sarcoid, complicated by GERD, glaucoma FOLLOWS FOR: review PFT, 6MW, CT Scan, and sputum cultures with patient. Pt states that she continues to have SOB with activitiy; when not active-she feels like she cant breathe Occasional cough, scant clear sputum, better with Dulera. Pains around chest, chronic, if she forces a deep breath. PFT 09/21/2012-mild obstructive airways disease with response to bronchodilator, moderate restriction, diffusion severely reduced. FVC 1.88/82%, FEV11.58/88%, FEV1/FVC 0.84, FEF 25-75% 1.86/106% with some response to bronchodilator in small airways. TLC 62%, DLCO 49%. 6MWT 09/21/12- WNL 97%, 97%, 98%, 381 m. Sputum culture-normal  flora, negative for AFB and fungus  08/25/13- 68 yoF former smoker followed for bronchitis/ bronchiolitis/ nodules, hx  Ocular sarcoid,  complicated by GERD, glaucoma FOLLOWS FOR: not using routine inhalers as she should; pt has good and bad days with breathing. Pt states her husabnd has noticed she has started snoring and gasping for air-has to be rolled over or get up and catch her breath. She was also told she stops breathing in her sleep and has noticed this during the day as well. CXR 6/ 4 /15 IMPRESSION:  No active cardiopulmonary disease.  Electronically Signed  By: Dereck Ligas M.D.  On: 07/14/2013 11:45  11/01/13-68 yoF former smoker followed for bronchitis/ bronchiolitis/ nodules, hx  Ocular sarcoid, OSA complicated by GERD, glaucoma FOLLOWS FOR: Pt c/o increased SOB with exertion, prod cough with clear to yellow mucus.  Review sleep study.  NPSG-10/18/13- Mild OSA, AHI 9.3/ hr, weight 164 lbs  To start CPAP auto  05/03/14- 68 yoF former smoker followed for bronchitis/ bronchiolitis/ nodules, hx  Ocular sarcoid, OSA complicated by GERD, glaucoma FOLLOWS FOR: Pt reports using CPAP x 4 nights a week when increasd cough is not present before going to sleep. Pt reports being sick in December and since she has been having increased cough with excessive mucus production -- prevents pt from wearing CPAP nightly and for longer than 4 hours per night consensus that she had a viral pattern bronchitis just before Christmas, treated with Tamiflu. With persistent cough, she saw Dr VanWinkle/ allergist who diagnosed "sinuses". She then called [place] because of bothersome cough when lying down which interferes with CPAP use has blown a little blood from her nose. May be very minimal wheeze. Cough productive thick yellow to clear sputum. Right frontoparietal headache over the last week. CXR 02/09/14- IMPRESSION: No active cardiopulmonary disease. Electronically Signed  By: Ivar Drape M.D.  On: 02/09/2014 09:30  09/14/14- 62 yoF former smoker followed for bronchitis/ bronchiolitis/ nodules, hx  Ocular sarcoid, OSA complicated by GERD,  glaucoma Reports: cough, prod prev w/green mucus; chest hurts from coughing; chest tightness; cough more than 2 weeks CPAP 9/ Advanced-irregular use. Wakes strangling. Mask makes her nose sore. Acute illness, 2 weeks, began with fever and chills, throat irritation/cough moved to chest. Diffuse anterior chest soreness with cough, green sputum. Got chilled during upper endoscopy last week.  12/01/14- 69 yoF former smoker followed for bronchitis/ bronchiolitis/ nodules, hx  Ocular sarcoid, OSA complicated by GERD, glaucoma CPAP 9/ Advanced-noncompliant Follows for: Acute visit. Pt c/o mostly wet cough, brown/tan mucus, post-tussive emesis, wheezing and SOB. Symptoms have been present for 2 weeks. Pt has been using albuterol HFA 4 times daily with some relief.  Denies fever or chills. An acute bronchitis during the summer resolved with Zithromax and prednisone and she was well until this new problem. Aware of GERD symptoms despite Nexium.  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 Pt has not been wearing CPAP for a while. DME is [facility]. Pt states her breathing has been doing well other cough that continues to make her wet her pants. Chest pain/tightness that moves from her back to center part of chest. Still aware of reflux. Stable midthoracic spine pain with no wedging of the mid thoracic vertebral body seen on chest x-ray. She is aware that glaucoma meds may  aggravate cough-Alphagan.  ROS-see HPI Constitutional:   No-   weight loss, night sweats, fevers, chills, fatigue, lassitude. HEENT:   +  headaches, difficulty swallowing, tooth/dental problems, sore throat,       Some  sneezing, itching, ear ache, +nasal congestion,  post nasal drip,  CV:  chest pain, no-orthopnea, PND, swelling in lower extremities, anasarca, dizziness, palpitations Resp: +  shortness of breath with exertion not at rest.              +   productive cough,  + non-productive cough,  No- coughing up of blood.                + in color of mucus.  + wheezing.   Skin: No-   rash or lesions. GI:  +  heartburn, indigestion, abdominal pain, nausea, vomiting,  GU:  MS:  +  joint pain or swelling, + back pain Neuro-     nothing unusual Psych:  No- change in mood or affect. No depression or anxiety.  No memory loss.  OBJ General- Alert, Oriented, Affect anxious, Distress- none acute Skin- rash-none, lesions- none, excoriation- none Lymphadenopathy- none Head- atraumatic            Eyes- Gross vision intact, PERRLA, conjunctivae clear secretions- not injected            Ears- Hearing aid Left            Nose- + turbinate edema, no-Septal dev, mucus, polyps, erosion, perforation             Throat- Mallampati III , mucosa clear , drainage- none, tonsils- atrophic Neck- flexible , trachea midline, no stridor , thyroid nl, carotid no bruit Chest - symmetrical excursion , unlabored           Heart/CV- RRR , no murmur , no gallop  , no rub, nl s1 s2                           - JVD- none , edema- none, stasis changes- none, varices- none           Lung- clear, cough-none, wheeze + mild, dullness-none, rub- none           Chest wall-  Abd-  Br/ Gen/ Rectal- Not done, not indicated Extrem- cyanosis- none, clubbing, none, atrophy- none, strength- nl.  Neuro- grossly intact to observation

## 2015-03-19 NOTE — Patient Instructions (Signed)
Flu vax  Sample Breo Ellipta 100    Inhale 1 puff once daily, then rinse mouth    Use up the sample and see if it helps the cough  Try raising the head end of your bed by putting a brick under the legs of the bed. This small tilt can help reflux at night and help your eye pressure  Please call as needed

## 2015-03-20 NOTE — Assessment & Plan Note (Signed)
Significant sarcoid scarring is not noted on chest x-ray. Concerned about possible role of reflux. Plan-flu vaccine. Elevate head of bed with brick to reduce reflux

## 2015-03-20 NOTE — Assessment & Plan Note (Signed)
Nothing significant noted on latest chest x-ray to suggest any progressive disease

## 2015-03-20 NOTE — Assessment & Plan Note (Signed)
Cough may be unrelated. Otherwise no recognized evidence of active systemic sarcoid.

## 2015-04-09 DIAGNOSIS — M7661 Achilles tendinitis, right leg: Secondary | ICD-10-CM | POA: Diagnosis not present

## 2015-04-09 DIAGNOSIS — M722 Plantar fascial fibromatosis: Secondary | ICD-10-CM | POA: Diagnosis not present

## 2015-04-09 DIAGNOSIS — E1351 Other specified diabetes mellitus with diabetic peripheral angiopathy without gangrene: Secondary | ICD-10-CM | POA: Diagnosis not present

## 2015-04-09 DIAGNOSIS — M2042 Other hammer toe(s) (acquired), left foot: Secondary | ICD-10-CM | POA: Diagnosis not present

## 2015-04-20 DIAGNOSIS — M545 Low back pain: Secondary | ICD-10-CM | POA: Diagnosis not present

## 2015-04-24 DIAGNOSIS — M722 Plantar fascial fibromatosis: Secondary | ICD-10-CM | POA: Diagnosis not present

## 2015-04-27 ENCOUNTER — Other Ambulatory Visit: Payer: Self-pay | Admitting: Nurse Practitioner

## 2015-04-27 ENCOUNTER — Ambulatory Visit
Admission: RE | Admit: 2015-04-27 | Discharge: 2015-04-27 | Disposition: A | Discharge status: Home / Self Care | Payer: Medicare Other | Source: Ambulatory Visit | Attending: Internal Medicine | Admitting: Internal Medicine

## 2015-04-27 DIAGNOSIS — M545 Low back pain: Secondary | ICD-10-CM | POA: Diagnosis not present

## 2015-04-27 DIAGNOSIS — E1165 Type 2 diabetes mellitus with hyperglycemia: Secondary | ICD-10-CM | POA: Diagnosis not present

## 2015-04-27 DIAGNOSIS — I2510 Atherosclerotic heart disease of native coronary artery without angina pectoris: Secondary | ICD-10-CM | POA: Diagnosis not present

## 2015-04-27 DIAGNOSIS — M47816 Spondylosis without myelopathy or radiculopathy, lumbar region: Secondary | ICD-10-CM | POA: Diagnosis not present

## 2015-04-27 DIAGNOSIS — I119 Hypertensive heart disease without heart failure: Secondary | ICD-10-CM | POA: Diagnosis not present

## 2015-05-15 ENCOUNTER — Other Ambulatory Visit: Payer: Self-pay | Admitting: Sports Medicine

## 2015-05-15 DIAGNOSIS — M546 Pain in thoracic spine: Secondary | ICD-10-CM | POA: Diagnosis not present

## 2015-05-15 DIAGNOSIS — M545 Low back pain: Secondary | ICD-10-CM

## 2015-05-22 ENCOUNTER — Ambulatory Visit
Admission: RE | Admit: 2015-05-22 | Discharge: 2015-05-22 | Disposition: A | Discharge status: Home / Self Care | Payer: Medicare Other | Source: Ambulatory Visit | Attending: Sports Medicine | Admitting: Sports Medicine

## 2015-05-22 DIAGNOSIS — M545 Low back pain: Secondary | ICD-10-CM

## 2015-05-30 DIAGNOSIS — M545 Low back pain: Secondary | ICD-10-CM | POA: Diagnosis not present

## 2015-06-06 ENCOUNTER — Other Ambulatory Visit: Payer: Self-pay | Admitting: Sports Medicine

## 2015-06-06 DIAGNOSIS — M545 Low back pain: Secondary | ICD-10-CM | POA: Diagnosis not present

## 2015-06-07 DIAGNOSIS — B028 Zoster with other complications: Secondary | ICD-10-CM | POA: Diagnosis not present

## 2015-06-07 DIAGNOSIS — M545 Low back pain: Secondary | ICD-10-CM | POA: Diagnosis not present

## 2015-06-10 ENCOUNTER — Ambulatory Visit
Admission: RE | Admit: 2015-06-10 | Discharge: 2015-06-10 | Disposition: A | Discharge status: Home / Self Care | Payer: Medicare Other | Source: Ambulatory Visit | Attending: Sports Medicine | Admitting: Sports Medicine

## 2015-06-10 DIAGNOSIS — M5126 Other intervertebral disc displacement, lumbar region: Secondary | ICD-10-CM | POA: Diagnosis not present

## 2015-06-10 DIAGNOSIS — M545 Low back pain: Secondary | ICD-10-CM

## 2015-06-12 DIAGNOSIS — H40042 Steroid responder, left eye: Secondary | ICD-10-CM | POA: Diagnosis not present

## 2015-06-14 DIAGNOSIS — M545 Low back pain: Secondary | ICD-10-CM | POA: Diagnosis not present

## 2015-07-12 DIAGNOSIS — H9190 Unspecified hearing loss, unspecified ear: Secondary | ICD-10-CM | POA: Diagnosis not present

## 2015-07-12 DIAGNOSIS — I119 Hypertensive heart disease without heart failure: Secondary | ICD-10-CM | POA: Diagnosis not present

## 2015-07-12 DIAGNOSIS — E1165 Type 2 diabetes mellitus with hyperglycemia: Secondary | ICD-10-CM | POA: Diagnosis not present

## 2015-07-12 DIAGNOSIS — B0222 Postherpetic trigeminal neuralgia: Secondary | ICD-10-CM | POA: Diagnosis not present

## 2015-07-20 ENCOUNTER — Other Ambulatory Visit: Payer: Self-pay | Admitting: *Deleted

## 2015-07-20 MED ORDER — FUROSEMIDE 20 MG PO TABS: 20.0000 mg | ORAL_TABLET | ORAL | Status: DC

## 2015-07-24 DIAGNOSIS — H903 Sensorineural hearing loss, bilateral: Secondary | ICD-10-CM | POA: Diagnosis not present

## 2015-07-24 DIAGNOSIS — J310 Chronic rhinitis: Secondary | ICD-10-CM | POA: Diagnosis not present

## 2015-08-09 ENCOUNTER — Ambulatory Visit (INDEPENDENT_AMBULATORY_CARE_PROVIDER_SITE_OTHER)
Admission: RE | Admit: 2015-08-09 | Discharge: 2015-08-09 | Disposition: A | Discharge status: Home / Self Care | Payer: Medicare Other | Source: Ambulatory Visit | Attending: Internal Medicine | Admitting: Internal Medicine

## 2015-08-09 ENCOUNTER — Encounter: Payer: Self-pay | Admitting: Internal Medicine

## 2015-08-09 ENCOUNTER — Ambulatory Visit (INDEPENDENT_AMBULATORY_CARE_PROVIDER_SITE_OTHER): Payer: Medicare Other | Admitting: Internal Medicine

## 2015-08-09 VITALS — BP 132/70 | HR 96 | Temp 98.1°F | Ht 62.0 in | Wt 164.4 lb

## 2015-08-09 DIAGNOSIS — R0789 Other chest pain: Secondary | ICD-10-CM | POA: Diagnosis not present

## 2015-08-09 DIAGNOSIS — R05 Cough: Secondary | ICD-10-CM | POA: Diagnosis not present

## 2015-08-09 DIAGNOSIS — J410 Simple chronic bronchitis: Secondary | ICD-10-CM | POA: Diagnosis not present

## 2015-08-09 DIAGNOSIS — J209 Acute bronchitis, unspecified: Secondary | ICD-10-CM

## 2015-08-09 DIAGNOSIS — D869 Sarcoidosis, unspecified: Secondary | ICD-10-CM | POA: Diagnosis not present

## 2015-08-09 DIAGNOSIS — R0602 Shortness of breath: Secondary | ICD-10-CM | POA: Diagnosis not present

## 2015-08-09 MED ORDER — PROMETHAZINE-CODEINE 6.25-10 MG/5ML PO SYRP: 5.0000 mL | ORAL_SOLUTION | Freq: Four times a day (QID) | ORAL | Status: DC | PRN

## 2015-08-09 MED ORDER — AZITHROMYCIN 250 MG PO TABS: ORAL_TABLET | ORAL | Status: DC

## 2015-08-09 MED ORDER — FLUTICASONE FUROATE-VILANTEROL 100-25 MCG/INH IN AEPB: 1.0000 | INHALATION_SPRAY | Freq: Every day | RESPIRATORY_TRACT | Status: DC

## 2015-08-09 MED ORDER — PROAIR HFA 108 (90 BASE) MCG/ACT IN AERS: 2.0000 | INHALATION_SPRAY | Freq: Four times a day (QID) | RESPIRATORY_TRACT | Status: DC | PRN

## 2015-08-09 MED ORDER — ARFORMOTEROL TARTRATE 15 MCG/2ML IN NEBU
15.0000 ug | INHALATION_SOLUTION | Freq: Once | RESPIRATORY_TRACT | Status: AC
  Administered 2015-08-09: 15 ug via RESPIRATORY_TRACT

## 2015-08-09 NOTE — Progress Notes (Signed)
08/19/12- 48 yoF former smoker followed for bronchitis, hx sarcoid, complicated by GERD, glaucoma follows for:  discuss ct results  Occasional sweats, minor rash at right ankle, cough productive white, thick and occasionally yellow sputum. Some dyspnea with exertion, no chest pain or palpitation. ACE 07/08/12- 27 (8-52) CT chest 08/18/12 IMPRESSION:  1. New clustered peribronchovascular ill-defined nodularity in the  right upper lobe, indicative of an infectious bronchiolitis.  2. Previously measured 6 mm right upper lobe nodule is less  discrete on the current examination.  3. Coronary artery calcification.  Original Report Authenticated By: Lorin Picket, M.D.  02/25/13- 78 yoF former smoker followed for bronchitis/ bronchiolitis/ nodules, hx  Ocular sarcoid, complicated by GERD, glaucoma FOLLOWS FOR: review PFT, 6MW, CT Scan, and sputum cultures with patient. Pt states that she continues to have SOB with activitiy; when not active-she feels like she cant breathe Occasional cough, scant clear sputum, better with Dulera. Pains around chest, chronic, if she forces a deep breath. PFT 09/21/2012-mild obstructive airways disease with response to bronchodilator, moderate restriction, diffusion severely reduced. FVC 1.88/82%, FEV11.58/88%, FEV1/FVC 0.84, FEF 25-75% 1.86/106% with some response to bronchodilator in small airways. TLC 62%, DLCO 49%. 6MWT 09/21/12- WNL 97%, 97%, 98%, 381 m. Sputum culture-normal flora, negative for AFB and fungus  08/25/13- 68 yoF former smoker followed for bronchitis/ bronchiolitis/ nodules, hx  Ocular sarcoid, complicated by GERD, glaucoma FOLLOWS FOR: not using routine inhalers as she should; pt has good and bad days with breathing. Pt states her husabnd has noticed she has started snoring and gasping for air-has to be rolled over or get up and catch her breath. She was also told she stops breathing in her sleep and has noticed this during the day as well. CXR 6/ 4  /15 IMPRESSION:  No active cardiopulmonary disease.  Electronically Signed  By: Dereck Ligas M.D.  On: 07/14/2013 11:45  11/01/13-68 yoF former smoker followed for bronchitis/ bronchiolitis/ nodules, hx  Ocular sarcoid, OSA complicated by GERD, glaucoma FOLLOWS FOR: Pt c/o increased SOB with exertion, prod cough with clear to yellow mucus.  Review sleep study.  NPSG-10/18/13- Mild OSA, AHI 9.3/ hr, weight 164 lbs  To start CPAP auto  05/03/14- 68 yoF former smoker followed for bronchitis/ bronchiolitis/ nodules, hx  Ocular sarcoid, OSA complicated by GERD, glaucoma FOLLOWS FOR: Pt reports using CPAP x 4 nights a week when increasd cough is not present before going to sleep. Pt reports being sick in December and since she has been having increased cough with excessive mucus production -- prevents pt from wearing CPAP nightly and for longer than 4 hours per night consensus that she had a viral pattern bronchitis just before Christmas, treated with Tamiflu. With persistent cough, she saw Dr VanWinkle/ allergist who diagnosed "sinuses". She then called [place] because of bothersome cough when lying down which interferes with CPAP use has blown a little blood from her nose. May be very minimal wheeze. Cough productive thick yellow to clear sputum. Right frontoparietal headache over the last week. CXR 02/09/14- IMPRESSION: No active cardiopulmonary disease. Electronically Signed  By: Ivar Drape M.D.  On: 02/09/2014 09:30  09/14/14- 89 yoF former smoker followed for bronchitis/ bronchiolitis/ nodules, hx  Ocular sarcoid, OSA complicated by GERD, glaucoma Reports: cough, prod prev w/green mucus; chest hurts from coughing; chest tightness; cough more than 2 weeks CPAP 9/ Advanced-irregular use. Wakes strangling. Mask makes her nose sore. Acute illness, 2 weeks, began with fever and chills, throat irritation/cough moved to chest. Diffuse anterior  chest soreness with cough, green sputum. Got chilled during  upper endoscopy last week.  12/01/14- 69 yoF former smoker followed for bronchitis/ bronchiolitis/ nodules, hx  Ocular sarcoid, OSA complicated by GERD, glaucoma CPAP 9/ Advanced-noncompliant Follows for: Acute visit. Pt c/o mostly wet cough, brown/tan mucus, post-tussive emesis, wheezing and SOB. Symptoms have been present for 2 weeks. Pt has been using albuterol HFA 4 times daily with some relief.  Denies fever or chills. An acute bronchitis during the summer resolved with Zithromax and prednisone and she was well until this new problem. Aware of GERD symptoms despite Nexium.  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 Pt has not been wearing CPAP for a while. DME is [facility]. Pt states her breathing has been doing well other cough that continues to make her wet her pants. Chest pain/tightness that moves from her back to center part of chest. Still aware of reflux. Stable midthoracic spine pain with no wedging of the mid thoracic vertebral body seen on chest x-ray. She is aware that glaucoma meds may aggravate cough-Alphagan.  08/09/2015-70 year old female former smoker followed for bronchitis/bronchiolitis/lung nodules, history ocular sarcoid, OSA, complicated by GERD, glaucoma, DM2 CPAP 9/Advanced-noncompliant FOLLOW FOR: coughing up yellow mucus, SOB, chest tightness, cramping on right side, pain in ribs, no fever, has been sick for 2 weeks. Sinus drainage. Went to ENT and all they gave her was nasal spray.   She recognizes she caught a cold from her granddaughter about 2 weeks ago. Had shingles earlier. Cramping pains right lateral ribs with hard cough. Using nebulizer. No leg pain or swelling, anginal pain or  palpitation, bloody or purulent sputum.  ROS-see HPI Constitutional:   No-   weight loss, night sweats, fevers, chills, fatigue, lassitude. HEENT:   +  headaches, difficulty swallowing, tooth/dental problems, sore throat,       Some  sneezing, itching, ear ache, +nasal congestion, post nasal drip,  CV:  chest pain, no-orthopnea, PND, swelling in lower extremities, anasarca, dizziness, palpitations Resp: +  shortness of breath with exertion not at rest.            productive cough,  + non-productive cough,  No- coughing up of blood.               in color of mucus.  + wheezing.   Skin: No-   rash or lesions. GI:  +  heartburn, indigestion, abdominal pain, nausea, vomiting,  GU:  MS:  +  joint pain or swelling, + back pain Neuro-     nothing unusual Psych:  No- change in mood or affect. No depression or anxiety.  No memory loss.  OBJ General- Alert, Oriented, Affect anxious, Distress- none acute Skin- rash-none, lesions- none, excoriation- none Lymphadenopathy- none Head- atraumatic            Eyes- Gross vision intact, PERRLA, conjunctivae clear secretions- not injected            Ears- Hearing aid Left            Nose- + turbinate edema, no-Septal dev, mucus, polyps, erosion, perforation             Throat- Mallampati III , mucosa clear , drainage- none, tonsils- atrophic Neck- flexible , trachea midline, no stridor , thyroid nl, carotid no bruit Chest -  symmetrical excursion , unlabored           Heart/CV- RRR , no murmur , no gallop  , no rub, nl s1 s2                           - JVD- none , edema- none, stasis changes- none, varices- none           Lung-  cough +, wheeze + mild, dullness-none, rub- none           Chest wall- no rash seen consistent with shingles Abd-  Br/ Gen/ Rectal- Not done, not indicated Extrem- cyanosis- none, clubbing, none, atrophy- none, strength- nl.  Neuro- grossly intact to observation

## 2015-08-09 NOTE — Patient Instructions (Addendum)
Scripts printed for Zpak and cough syrup  Heating pad and Ibuprofen may help rib pain  Order neb Brovana    Dx acute bronchitis  Order- CXR    Dx Acute bronchitis, Right lateral chest wall pain  Ok to keep appointment August 7

## 2015-08-19 NOTE — Assessment & Plan Note (Signed)
In clinical remission with residual scarring

## 2015-08-19 NOTE — Assessment & Plan Note (Signed)
Acute exacerbation of chronic bronchitis consistent with acute infection. Tussive chest wall pain Plan-CXR, nebulizer treatment profile, Z-Pak. Discussed cough control.

## 2015-08-22 DIAGNOSIS — H35351 Cystoid macular degeneration, right eye: Secondary | ICD-10-CM | POA: Insufficient documentation

## 2015-08-22 DIAGNOSIS — H401134 Primary open-angle glaucoma, bilateral, indeterminate stage: Secondary | ICD-10-CM | POA: Diagnosis not present

## 2015-08-22 DIAGNOSIS — H2013 Chronic iridocyclitis, bilateral: Secondary | ICD-10-CM | POA: Insufficient documentation

## 2015-08-22 DIAGNOSIS — H2512 Age-related nuclear cataract, left eye: Secondary | ICD-10-CM | POA: Diagnosis not present

## 2015-08-31 DIAGNOSIS — R0602 Shortness of breath: Secondary | ICD-10-CM | POA: Diagnosis not present

## 2015-08-31 DIAGNOSIS — H409 Unspecified glaucoma: Secondary | ICD-10-CM | POA: Diagnosis not present

## 2015-08-31 DIAGNOSIS — E1165 Type 2 diabetes mellitus with hyperglycemia: Secondary | ICD-10-CM | POA: Diagnosis not present

## 2015-08-31 DIAGNOSIS — R350 Frequency of micturition: Secondary | ICD-10-CM | POA: Diagnosis not present

## 2015-08-31 DIAGNOSIS — Z79899 Other long term (current) drug therapy: Secondary | ICD-10-CM | POA: Diagnosis not present

## 2015-09-07 DIAGNOSIS — H35373 Puckering of macula, bilateral: Secondary | ICD-10-CM | POA: Diagnosis not present

## 2015-09-07 DIAGNOSIS — H2012 Chronic iridocyclitis, left eye: Secondary | ICD-10-CM | POA: Diagnosis not present

## 2015-09-07 DIAGNOSIS — H2013 Chronic iridocyclitis, bilateral: Secondary | ICD-10-CM | POA: Diagnosis not present

## 2015-09-07 DIAGNOSIS — H35371 Puckering of macula, right eye: Secondary | ICD-10-CM | POA: Diagnosis not present

## 2015-09-07 DIAGNOSIS — H43813 Vitreous degeneration, bilateral: Secondary | ICD-10-CM | POA: Diagnosis not present

## 2015-09-07 DIAGNOSIS — H35372 Puckering of macula, left eye: Secondary | ICD-10-CM | POA: Diagnosis not present

## 2015-09-07 DIAGNOSIS — H43812 Vitreous degeneration, left eye: Secondary | ICD-10-CM | POA: Diagnosis not present

## 2015-09-07 DIAGNOSIS — H2011 Chronic iridocyclitis, right eye: Secondary | ICD-10-CM | POA: Diagnosis not present

## 2015-09-07 DIAGNOSIS — H43811 Vitreous degeneration, right eye: Secondary | ICD-10-CM | POA: Diagnosis not present

## 2015-09-14 DIAGNOSIS — H2013 Chronic iridocyclitis, bilateral: Secondary | ICD-10-CM | POA: Diagnosis not present

## 2015-09-14 DIAGNOSIS — H4062X4 Glaucoma secondary to drugs, left eye, indeterminate stage: Secondary | ICD-10-CM | POA: Diagnosis not present

## 2015-09-17 ENCOUNTER — Encounter: Payer: Self-pay | Admitting: Internal Medicine

## 2015-09-17 ENCOUNTER — Other Ambulatory Visit (INDEPENDENT_AMBULATORY_CARE_PROVIDER_SITE_OTHER): Payer: Medicare Other

## 2015-09-17 ENCOUNTER — Ambulatory Visit (INDEPENDENT_AMBULATORY_CARE_PROVIDER_SITE_OTHER): Payer: Medicare Other | Admitting: Internal Medicine

## 2015-09-17 VITALS — BP 122/70 | HR 70 | Ht 62.0 in | Wt 160.4 lb

## 2015-09-17 DIAGNOSIS — J410 Simple chronic bronchitis: Secondary | ICD-10-CM | POA: Diagnosis not present

## 2015-09-17 DIAGNOSIS — D869 Sarcoidosis, unspecified: Secondary | ICD-10-CM

## 2015-09-17 DIAGNOSIS — R0789 Other chest pain: Secondary | ICD-10-CM

## 2015-09-17 DIAGNOSIS — G4733 Obstructive sleep apnea (adult) (pediatric): Secondary | ICD-10-CM

## 2015-09-17 LAB — CBC WITH DIFFERENTIAL/PLATELET
BASOS PCT: 0.4 % (ref 0.0–3.0)
Basophils Absolute: 0 10*3/uL (ref 0.0–0.1)
EOS PCT: 1.7 % (ref 0.0–5.0)
Eosinophils Absolute: 0.1 10*3/uL (ref 0.0–0.7)
HCT: 38.6 % (ref 36.0–46.0)
Hemoglobin: 12.9 g/dL (ref 12.0–15.0)
LYMPHS ABS: 1.7 10*3/uL (ref 0.7–4.0)
Lymphocytes Relative: 21.7 % (ref 12.0–46.0)
MCHC: 33.3 g/dL (ref 30.0–36.0)
MCV: 83.6 fl (ref 78.0–100.0)
MONOS PCT: 6.5 % (ref 3.0–12.0)
Monocytes Absolute: 0.5 10*3/uL (ref 0.1–1.0)
NEUTROS ABS: 5.5 10*3/uL (ref 1.4–7.7)
NEUTROS PCT: 69.7 % (ref 43.0–77.0)
PLATELETS: 213 10*3/uL (ref 150.0–400.0)
RBC: 4.63 Mil/uL (ref 3.87–5.11)
RDW: 14.6 % (ref 11.5–15.5)
WBC: 7.9 10*3/uL (ref 4.0–10.5)

## 2015-09-17 LAB — BASIC METABOLIC PANEL
BUN: 14 mg/dL (ref 6–23)
CALCIUM: 9.9 mg/dL (ref 8.4–10.5)
CO2: 24 mEq/L (ref 19–32)
CREATININE: 0.87 mg/dL (ref 0.40–1.20)
Chloride: 106 mEq/L (ref 96–112)
GFR: 83.22 mL/min (ref >60.00)
Glucose, Bld: 99 mg/dL (ref 70–99)
Potassium: 4.2 mEq/L (ref 3.5–5.1)
Sodium: 138 mEq/L (ref 135–145)

## 2015-09-17 LAB — HEPATIC FUNCTION PANEL
ALT: 17 U/L (ref 0–35)
AST: 18 U/L (ref 0–37)
Albumin: 4.7 g/dL (ref 3.5–5.2)
Alkaline Phosphatase: 116 U/L (ref 39–117)
BILIRUBIN DIRECT: 0.1 mg/dL (ref 0.0–0.3)
BILIRUBIN TOTAL: 0.4 mg/dL (ref 0.2–1.2)
TOTAL PROTEIN: 7.8 g/dL (ref 6.0–8.3)

## 2015-09-17 MED ORDER — UMECLIDINIUM-VILANTEROL 62.5-25 MCG/INH IN AEPB: 1.0000 | INHALATION_SPRAY | Freq: Every day | RESPIRATORY_TRACT | 0 refills | Status: DC

## 2015-09-17 NOTE — Progress Notes (Signed)
HPI  F former smoker followed for bronchitis, hx  Occular sarcoid, bronchitis/ nodules, OSA complicated by GERD, glaucoma, DM2   12/01/14- 69 yoF former smoker followed for bronchitis/ bronchiolitis/ nodules, hx  Ocular sarcoid, OSA complicated by GERD, glaucoma CPAP 9/ Advanced-noncompliant Follows for: Acute visit. Pt c/o mostly wet cough, brown/tan mucus, post-tussive emesis, wheezing and SOB. Symptoms have been present for 2 weeks. Pt has been using albuterol HFA 4 times daily with some relief.  Denies fever or chills. An acute bronchitis during the summer resolved with Zithromax and prednisone and she was well until this new problem. Aware of GERD symptoms despite Nexium.  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 Pt has not been wearing CPAP for a while. DME is [facility]. Pt states her breathing has been doing well other cough that continues to make her wet her pants. Chest pain/tightness that moves from her back to center part of chest. Still aware of reflux. Stable midthoracic spine pain with no wedging of the mid thoracic vertebral body seen on chest x-ray. She is aware that glaucoma meds may aggravate cough-Alphagan.  08/09/2015-70 year old female former smoker followed for bronchitis/bronchiolitis/lung nodules, history ocular sarcoid, OSA, complicated by GERD, glaucoma, DM2 CPAP 9/Advanced-noncompliant FOLLOW FOR: coughing up yellow mucus, SOB, chest tightness, cramping on right side, pain in ribs, no fever, has been sick for 2 weeks. Sinus drainage. Went to ENT and all they gave her was nasal spray.   She recognizes she caught a cold from her granddaughter about 2 weeks ago. Had shingles earlier. Cramping  pains right lateral ribs with hard cough. Using nebulizer. No leg pain or swelling, anginal pain or palpitation, bloody or purulent sputum.  09/17/2015-70 year old female former smoker followed for bronchitis/bronchiolitis/lung nodules, history of ocular sarcoid, OSA, complicated by GERD, glaucoma, DM 2 CPAP 9/ Advanced FOLLOWS FOR: Pt states she continues to cough-productive-clear as well as SOB and wheezing. Could not tell any difference using Breo ACE level 05/03/14- 51 Complains of persistent intermittent left chest wall and upper arm pain relieved by "cracking" her neck. Her eye specialist wanted her to take a new eyedrop for sarcoid but thought it might aggravate her cough. Coughing thick white sputum, + night sweats, no fever, rash or nodes CXR 08/09/2015 FINDINGS: Stable mild fullness of the retrosternal soft tissues most likely mediastinal fat. Mediastinum otherwise unremarkable. Hilar structures unremarkable . Mild bibasilar subsegmental atelectasis and or scarring. No pleural effusion pneumothorax. Heart size normal. No acute bony abnormality. IMPRESSION: Bibasilar subsegmental atelectasis and/or scarring. Electronically Signed   By: Marcello Moores  Register   On: 08/09/2015 10:29  ROS-see HPI Constitutional:   No-   weight loss, night sweats, fevers, chills, fatigue, lassitude. HEENT:   +  headaches, difficulty swallowing, tooth/dental problems, sore throat,       Some  sneezing, itching, ear ache, +nasal congestion, post nasal drip,  CV:  chest pain, no-orthopnea, PND, swelling in lower extremities, anasarca, dizziness, palpitations Resp: +  shortness of breath with exertion not at rest.            productive cough,  + non-productive cough,  No- coughing up of blood.               in color of mucus.  + wheezing.   Skin: No-   rash or lesions.  GI:  +  heartburn, indigestion, abdominal pain, nausea, vomiting,  GU:  MS:  +  joint pain or swelling, + back pain Neuro-     nothing  unusual Psych:  No- change in mood or affect. No depression or anxiety.  No memory loss.  OBJ General- Alert, Oriented, Affect anxious, Distress- none acute Skin- rash-none, lesions- none, excoriation- none Lymphadenopathy- none Head- atraumatic            Eyes- Gross vision intact, PERRLA, conjunctivae clear secretions- not injected            Ears- Hearing aid Left            Nose- + turbinate edema, no-Septal dev, mucus, polyps, erosion, perforation             Throat- Mallampati III , mucosa clear , drainage- none, tonsils- atrophic Neck- flexible , trachea midline, no stridor , thyroid nl, carotid no bruit Chest - symmetrical excursion , unlabored           Heart/CV- RRR , no murmur , no gallop  , no rub, nl s1 s2                           - JVD- none , edema- none, stasis changes- none, varices- none           Lung-  cough +, wheeze + mild, dullness-none, rub- none           Chest wall- no rash seen consistent with shingles Abd- No HSM Br/ Gen/ Rectal- Not done, not indicated Extrem- cyanosis- none, clubbing, none, atrophy- none, strength- nl.  Neuro- grossly intact to observation

## 2015-09-17 NOTE — Patient Instructions (Signed)
Sample Anoro inhaler- Inhale 1 puff, once daily   See if it helps cough  Order- lab- ACE level, CBC w diff, BMET, Hepatic function test    Dx Sarcoid  Order- schedule PFT

## 2015-09-18 LAB — ANGIOTENSIN CONVERTING ENZYME: ANGIOTENSIN-CONVERTING ENZYME: 47 U/L (ref 9–67)

## 2015-09-19 DIAGNOSIS — H4062X4 Glaucoma secondary to drugs, left eye, indeterminate stage: Secondary | ICD-10-CM | POA: Diagnosis not present

## 2015-09-19 DIAGNOSIS — H2012 Chronic iridocyclitis, left eye: Secondary | ICD-10-CM | POA: Diagnosis not present

## 2015-09-20 DIAGNOSIS — H401122 Primary open-angle glaucoma, left eye, moderate stage: Secondary | ICD-10-CM | POA: Diagnosis not present

## 2015-09-20 DIAGNOSIS — H2512 Age-related nuclear cataract, left eye: Secondary | ICD-10-CM | POA: Diagnosis not present

## 2015-09-20 DIAGNOSIS — H35351 Cystoid macular degeneration, right eye: Secondary | ICD-10-CM | POA: Diagnosis not present

## 2015-09-20 DIAGNOSIS — H2013 Chronic iridocyclitis, bilateral: Secondary | ICD-10-CM | POA: Diagnosis not present

## 2015-09-20 DIAGNOSIS — H401113 Primary open-angle glaucoma, right eye, severe stage: Secondary | ICD-10-CM | POA: Diagnosis not present

## 2015-09-21 NOTE — Progress Notes (Signed)
Called and spoke to pt. Informed her of the results per CY. Pt verbalized understanding and denied any further questions or concerns at this time.   

## 2015-09-25 NOTE — Assessment & Plan Note (Signed)
Relieved by "cracking" her neck. 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 She can discuss with her PCP.

## 2015-09-25 NOTE — Assessment & Plan Note (Signed)
She was noncompliant with CPAP and has not felt affected enough to want other therapy at this time.

## 2015-09-25 NOTE — Assessment & Plan Note (Signed)
I don't expect to find systemic active sarcoid at this time although she is treated for localized sarcoid ocular involvement. Plan-labs for ACE level, liver function, chemistry, CBC with differential

## 2015-09-25 NOTE — Assessment & Plan Note (Signed)
She is old for this to be related to pulmonary sarcoid. I don't get a history of aspiration. Plan-PFT

## 2015-10-17 ENCOUNTER — Other Ambulatory Visit: Payer: Self-pay | Admitting: Cardiovascular Disease

## 2015-10-23 DIAGNOSIS — H20013 Primary iridocyclitis, bilateral: Secondary | ICD-10-CM | POA: Diagnosis not present

## 2015-10-29 DIAGNOSIS — G4700 Insomnia, unspecified: Secondary | ICD-10-CM | POA: Diagnosis not present

## 2015-10-29 DIAGNOSIS — I10 Essential (primary) hypertension: Secondary | ICD-10-CM | POA: Diagnosis not present

## 2015-10-29 DIAGNOSIS — E1165 Type 2 diabetes mellitus with hyperglycemia: Secondary | ICD-10-CM | POA: Diagnosis not present

## 2015-10-29 DIAGNOSIS — H409 Unspecified glaucoma: Secondary | ICD-10-CM | POA: Diagnosis not present

## 2015-10-29 DIAGNOSIS — Z23 Encounter for immunization: Secondary | ICD-10-CM | POA: Diagnosis not present

## 2015-10-31 DIAGNOSIS — H401122 Primary open-angle glaucoma, left eye, moderate stage: Secondary | ICD-10-CM | POA: Insufficient documentation

## 2015-11-01 DIAGNOSIS — Z8614 Personal history of Methicillin resistant Staphylococcus aureus infection: Secondary | ICD-10-CM | POA: Diagnosis not present

## 2015-11-01 DIAGNOSIS — Z91013 Allergy to seafood: Secondary | ICD-10-CM | POA: Diagnosis not present

## 2015-11-01 DIAGNOSIS — H4010X2 Unspecified open-angle glaucoma, moderate stage: Secondary | ICD-10-CM | POA: Diagnosis not present

## 2015-11-01 DIAGNOSIS — I341 Nonrheumatic mitral (valve) prolapse: Secondary | ICD-10-CM | POA: Diagnosis not present

## 2015-11-01 DIAGNOSIS — K219 Gastro-esophageal reflux disease without esophagitis: Secondary | ICD-10-CM | POA: Diagnosis not present

## 2015-11-01 DIAGNOSIS — Z7984 Long term (current) use of oral hypoglycemic drugs: Secondary | ICD-10-CM | POA: Diagnosis not present

## 2015-11-01 DIAGNOSIS — H401122 Primary open-angle glaucoma, left eye, moderate stage: Secondary | ICD-10-CM | POA: Diagnosis not present

## 2015-11-01 DIAGNOSIS — G4730 Sleep apnea, unspecified: Secondary | ICD-10-CM | POA: Diagnosis not present

## 2015-11-01 DIAGNOSIS — I10 Essential (primary) hypertension: Secondary | ICD-10-CM | POA: Diagnosis not present

## 2015-11-01 DIAGNOSIS — Z87891 Personal history of nicotine dependence: Secondary | ICD-10-CM | POA: Diagnosis not present

## 2015-11-01 DIAGNOSIS — H401123 Primary open-angle glaucoma, left eye, severe stage: Secondary | ICD-10-CM | POA: Diagnosis not present

## 2015-11-01 DIAGNOSIS — Z885 Allergy status to narcotic agent status: Secondary | ICD-10-CM | POA: Diagnosis not present

## 2015-11-01 DIAGNOSIS — E785 Hyperlipidemia, unspecified: Secondary | ICD-10-CM | POA: Diagnosis not present

## 2015-11-01 DIAGNOSIS — E119 Type 2 diabetes mellitus without complications: Secondary | ICD-10-CM | POA: Diagnosis not present

## 2015-11-01 DIAGNOSIS — Z79899 Other long term (current) drug therapy: Secondary | ICD-10-CM | POA: Diagnosis not present

## 2015-11-01 DIAGNOSIS — Z7982 Long term (current) use of aspirin: Secondary | ICD-10-CM | POA: Diagnosis not present

## 2015-11-16 DIAGNOSIS — H2512 Age-related nuclear cataract, left eye: Secondary | ICD-10-CM | POA: Insufficient documentation

## 2015-11-16 DIAGNOSIS — Z9889 Other specified postprocedural states: Secondary | ICD-10-CM | POA: Insufficient documentation

## 2015-11-16 DIAGNOSIS — R05 Cough: Secondary | ICD-10-CM | POA: Diagnosis not present

## 2015-11-16 DIAGNOSIS — J019 Acute sinusitis, unspecified: Secondary | ICD-10-CM | POA: Diagnosis not present

## 2015-11-20 DIAGNOSIS — H20013 Primary iridocyclitis, bilateral: Secondary | ICD-10-CM | POA: Diagnosis not present

## 2015-11-21 ENCOUNTER — Ambulatory Visit (HOSPITAL_COMMUNITY)
Admission: EM | Admit: 2015-11-21 | Discharge: 2015-11-21 | Disposition: A | Discharge status: Home / Self Care | Payer: Medicare Other | Attending: Internal Medicine | Admitting: Internal Medicine

## 2015-11-21 ENCOUNTER — Encounter (HOSPITAL_COMMUNITY): Payer: Self-pay | Admitting: Family Medicine

## 2015-11-21 DIAGNOSIS — R002 Palpitations: Secondary | ICD-10-CM | POA: Diagnosis not present

## 2015-11-21 NOTE — Discharge Instructions (Signed)
I believe that you are safe to be discharged home to follow up with your cardiologist outpatient. You need to call tomorrow to schedule an appointment to be seem. If you starts to have chest pain, then you need to go to the ER. I hope you get better.

## 2015-11-21 NOTE — ED Provider Notes (Signed)
CSN: [id number]     Arrival date & time 11/21/15  1857 History   First MD Initiated Contact with Patient 11/21/15 2005     Chief Complaint  Patient presents with  . Palpitations   (Consider location/radiation/quality/duration/timing/severity/associated sxs/prior Treatment) Sheila Reynolds is a well-appearing 70 y.o female, presents today for heart fluttering/palpitation. Her PMH are significant for HTN, HLD, Anxiety, SVT, CAD, Bronchitis with emphysema. Her PCP is Dr. Baird Cancer. Her cardiologist is Dr. Acie Fredrickson. She also has a pulmonologist. She last saw her cardiologist  In 02/2015 and reports that everything was checked out fine. She sees her cardiologist once a year. She reports that she always have heart palpitation but noticed to be worst since yesterday. She reports her palpitation to be intermittent and without any known triggers. She denies CP, SOB, dizziness, or nausea. She denies recent changes in life. She denies recent medication changes. She currently has no palpitation and reports the she didn't have palpitation when her EKG was done earlier.       Past Medical History:  Diagnosis Date  . Anemia    3 months ago anemic  . Anxiety    on meds  . Bronchitis with emphysema   . Cancer ([place])   . Chest pain   . Coronary artery disease    Mild to moderate CAD  . Diabetes mellitus without complication ([place])   . Dysrhythmia    has fluttering at times  . Family history of anesthesia complication    daughter N/V  . Fibromyalgia   . GERD (gastroesophageal reflux disease)    on meds  . Headache(784.0)   . Heart murmur   . Hx of echocardiogram    Echo (03/2013):  Tech limited; Mild focal basal septal hypertrophy, EF 60-65%, normal RVF  . Hyperlipidemia   . Hypertension   . Nonsustained ventricular tachycardia ([place])   . Palpitations   . PVC (premature ventricular contraction)    a. Holter 12/16: NSR, occ PAC,PVCs  . Shortness of breath    loses breathe at times and have to take extra  breathes  . Sleep apnea    CPAP   Past Surgical History:  Procedure Laterality Date  . BREAST SURGERY  approx 1992   bilateral breast surgery for fibrocystic breast disease  . CARDIAC CATHETERIZATION  05/04/2007   reveals overall normal left ventricular systolic function. Ejection fraction 65-70%  . CATARACT EXTRACTION W/PHACO Right 07/20/2013   Procedure: CATARACT EXTRACTION PHACO AND INTRAOCULAR LENS PLACEMENT (IOC);  Surgeon: Marylynn Pearson, MD;  Location: [place];  Service: Ophthalmology;  Laterality: Right;  . COLONOSCOPY W/ BIOPSIES AND POLYPECTOMY    . COLONOSCOPY WITH PROPOFOL N/A 09/07/2014   Procedure: COLONOSCOPY WITH PROPOFOL;  Surgeon: Juanita Craver, MD;  Location: [facility];  Service: Endoscopy;  Laterality: N/A;  . ESOPHAGOGASTRODUODENOSCOPY (EGD) WITH PROPOFOL N/A 09/07/2014   Procedure: ESOPHAGOGASTRODUODENOSCOPY (EGD) WITH PROPOFOL;  Surgeon: Juanita Craver, MD;  Location: [facility];  Service: Endoscopy;  Laterality: N/A;  . EXTERNAL EAR SURGERY     tumors removed  . MINI SHUNT INSERTION Right 07/20/2013   Procedure: INSERTION OF GLAUCOMA FILTRATION DEVICE RIGHT EYE;  Surgeon: Marylynn Pearson, MD;  Location: [place];  Service: Ophthalmology;  Laterality: Right;  . MITOMYCIN C APPLICATION Right [id number]   Procedure: MITOMYCIN C APPLICATION;  Surgeon: Marylynn Pearson, MD;  Location: [place];  Service: Ophthalmology;  Laterality: Right;  . MITOMYCIN C APPLICATION Right [id number]   Procedure: MITOMYCIN C APPLICATION RIGHT EYE;  Surgeon: Marylynn Pearson, MD;  Location: [facility];  Service: Ophthalmology;  Laterality: Right;  . TONSILLECTOMY    . TOTAL ABDOMINAL HYSTERECTOMY  1982  . TRABECULECTOMY Right 02/21/2015   Procedure: TRABECULECTOMY WITH [facility] ON THE RIGHT EYE;  Surgeon: Marylynn Pearson, MD;  Location: [place];  Service: Ophthalmology;  Laterality: Right;   Family History  Problem Relation Age of Onset  . Heart disease Father   . Diabetes Father   . Glaucoma Father   . Cancer Mother      unknown type, ?lung  . Heart disease Paternal Grandmother   . Cancer Paternal Grandmother     unknown type  . Diabetes Brother   . Glaucoma Brother    Social History  Substance Use Topics  . Smoking status: Former Smoker    Packs/day: 1.00    Years: 4.00    Types: Cigarettes    Quit date: 02/11/1980  . Smokeless tobacco: Never Used  . Alcohol use No   OB History    Gravida Para Term Preterm AB Living   2 2       2    SAB TAB Ectopic Multiple Live Births                 Review of Systems  Constitutional: Positive for fatigue. Negative for chills and fever.  HENT: Negative for congestion, ear pain, rhinorrhea, sinus pressure, sneezing and sore throat.   Respiratory: Negative for cough and shortness of breath.   Cardiovascular: Positive for palpitations. Negative for chest pain.  Gastrointestinal: Negative for abdominal pain, nausea and vomiting.  Neurological: Positive for headaches. Negative for dizziness and weakness.    Allergies  Crestor [rosuvastatin calcium]; Shellfish allergy; and Demerol [meperidine]  Home Medications   Prior to Admission medications   Medication Sig Start Date End Date Taking? Authorizing Provider  acetaZOLAMIDE (DIAMOX) 250 MG tablet Take 1 tablet by mouth 3 (three) times daily. 08/24/15 09/23/15  Historical Provider, MD  albuterol (PROVENTIL) (2.5 MG/3ML) 0.083% nebulizer solution INHALE 2 ML(S) BY NEBULIZATION 3 TIMES A DAY AS NEEDED FOR EXACERBATIONS 12/11/14   Historical Provider, MD  ALPRAZolam Duanne Moron) 0.5 MG tablet Take 0.25 mg by mouth at bedtime as needed for anxiety or sleep. For sleep    Historical Provider, MD  aspirin EC 81 MG tablet Take 81 mg by mouth daily.    Historical Provider, MD  atorvastatin (LIPITOR) 20 MG tablet TAKE 1 TABLET BY MOUTH EVERY DAY 01/08/15   Thayer Headings, MD  brimonidine (ALPHAGAN P) 0.1 % SOLN Place 1 drop into both eyes 2 (two) times daily. Reported on 02/15/2015    Historical Provider, MD  brinzolamide (AZOPT)  1 % ophthalmic suspension Place 1 drop into both eyes 3 (three) times daily.     Historical Provider, MD  esomeprazole (NEXIUM) 40 MG capsule Take 40 mg by mouth 2 (two) times daily as needed (reflux).     Historical Provider, MD  fluticasone furoate-vilanterol (BREO ELLIPTA) 100-25 MCG/INH AEPB Inhale 1 puff into the lungs daily. RINSE MOUTH AFTER USE 08/09/15   Deneise Lever, MD  furosemide (LASIX) 20 MG tablet Take 1 tablet (20 mg total) by mouth every 3 (three) days. 07/20/15   Thayer Headings, MD  JANUMET 50-500 MG per tablet Take 1 tablet by mouth Twice daily. 06/05/11   Historical Provider, MD  loteprednol (LOTEMAX) 0.5 % ophthalmic suspension Place 1 drop into the left eye 2 (two) times daily as needed (inflammation).     Historical Provider, MD  metoprolol (LOPRESSOR)  50 MG tablet TAKE 1 TABLET BY MOUTH 2 TIMES DAILY. 10/17/15   Thayer Headings, MD  montelukast (SINGULAIR) 10 MG tablet Take 10 mg by mouth at bedtime.    Historical Provider, MD  olmesartan (BENICAR) 20 MG tablet Take 20 mg by mouth daily.    Historical Provider, MD  prednisoLONE acetate (PRED FORTE) 1 % ophthalmic suspension Place 1 drop into the right eye 2 (two) times daily.    Historical Provider, MD  pregabalin (LYRICA) 75 MG capsule Take 75 mg by mouth at bedtime.    Historical Provider, MD  PROAIR HFA 108 [phone] Base) MCG/ACT inhaler Inhale 2 puffs into the lungs every 6 (six) hours as needed for wheezing. 08/09/15   Deneise Lever, MD  promethazine-codeine (PHENERGAN WITH CODEINE) 6.25-10 MG/5ML syrup Take 5 mLs by mouth every 6 (six) hours as needed for cough. 08/09/15   Deneise Lever, MD  Travoprost (TRAVATAN OP) Place 1 drop into both eyes at bedtime.     Historical Provider, MD  umeclidinium-vilanterol (ANORO ELLIPTA) 62.5-25 MCG/INH AEPB Inhale 1 puff into the lungs daily. 09/17/15   Deneise Lever, MD   Meds Ordered and Administered this Visit  Medications - No data to display  BP 160/82   Pulse 75   Temp 98.3 F  (36.8 C)   Resp 18   SpO2 100%  No data found.   Physical Exam  Constitutional: She is oriented to person, place, and time. She appears well-developed and well-nourished.  HENT:  Head: Normocephalic and atraumatic.  Right Ear: External ear normal.  Left Ear: External ear normal.  Nose: Nose normal.  Mouth/Throat: Oropharynx is clear and moist. No oropharyngeal exudate.  TM normal bilaterally  Eyes: EOM are normal. Pupils are equal, round, and reactive to light.  Neck: Normal range of motion. Neck supple.  Cardiovascular: Normal rate, regular rhythm, normal heart sounds and intact distal pulses.   No murmur heard. Pulmonary/Chest: Effort normal and breath sounds normal. No respiratory distress. She has no wheezes.  Abdominal: Soft. Bowel sounds are normal. She exhibits no distension and no mass. There is no tenderness.  Musculoskeletal: Normal range of motion.  Neurological: She is alert and oriented to person, place, and time.  Skin: Skin is warm and dry.  Psychiatric: She has a normal mood and affect.  Nursing note and vitals reviewed.   Urgent Care Course   Clinical Course    Procedures (including critical care time)  Labs Review Labs Reviewed - No data to display  Imaging Review No results found.   MDM   1. Palpitations    1) Old medical records reviewed esp the last cardiology visit and appears that patient last saw cardiologist 02/2015 and is not due for another f/u until 02/2016.  2) Old medical records also shows that patient have had this heart palpitation for at least 2 year with frequent episodes where she feel like her palpitation is getting worst.  3) No etiology has been found to why she is having these palpitations. Her past EKGs have always been normal.  4) She even had Holter Monitor for 48 hours in 01/2015 and showed Premature beats. This may explains the fluttering or palpitation.  5) EKG today is, again, normal 6) I believes patient is safe to be  discharge home to follow up with cardiologist outpatient. Informed to call the cardiology office tomorrow to schedule an appointment 7) ER return precaution discussed.    Barry Dienes, NP 11/21/15 2120

## 2015-11-21 NOTE — ED Triage Notes (Signed)
Pt here for fluttering in chest that has been constant since yesterday. sts some slight SOB.

## 2015-11-22 ENCOUNTER — Ambulatory Visit: Payer: Medicare Other | Admitting: Internal Medicine

## 2015-12-28 DIAGNOSIS — Z9889 Other specified postprocedural states: Secondary | ICD-10-CM | POA: Diagnosis not present

## 2015-12-28 DIAGNOSIS — H401113 Primary open-angle glaucoma, right eye, severe stage: Secondary | ICD-10-CM | POA: Diagnosis not present

## 2015-12-28 DIAGNOSIS — H401122 Primary open-angle glaucoma, left eye, moderate stage: Secondary | ICD-10-CM | POA: Diagnosis not present

## 2016-01-18 DIAGNOSIS — H2013 Chronic iridocyclitis, bilateral: Secondary | ICD-10-CM | POA: Diagnosis not present

## 2016-01-18 DIAGNOSIS — H20013 Primary iridocyclitis, bilateral: Secondary | ICD-10-CM | POA: Diagnosis not present

## 2016-01-21 DIAGNOSIS — I119 Hypertensive heart disease without heart failure: Secondary | ICD-10-CM | POA: Diagnosis not present

## 2016-01-21 DIAGNOSIS — Z0000 Encounter for general adult medical examination without abnormal findings: Secondary | ICD-10-CM | POA: Diagnosis not present

## 2016-01-21 DIAGNOSIS — I2510 Atherosclerotic heart disease of native coronary artery without angina pectoris: Secondary | ICD-10-CM | POA: Diagnosis not present

## 2016-01-21 DIAGNOSIS — E1165 Type 2 diabetes mellitus with hyperglycemia: Secondary | ICD-10-CM | POA: Diagnosis not present

## 2016-01-21 DIAGNOSIS — M25511 Pain in right shoulder: Secondary | ICD-10-CM | POA: Diagnosis not present

## 2016-01-21 DIAGNOSIS — E559 Vitamin D deficiency, unspecified: Secondary | ICD-10-CM | POA: Diagnosis not present

## 2016-01-21 DIAGNOSIS — Z1389 Encounter for screening for other disorder: Secondary | ICD-10-CM | POA: Diagnosis not present

## 2016-01-30 DIAGNOSIS — Z1231 Encounter for screening mammogram for malignant neoplasm of breast: Secondary | ICD-10-CM | POA: Diagnosis not present

## 2016-01-30 DIAGNOSIS — Z803 Family history of malignant neoplasm of breast: Secondary | ICD-10-CM | POA: Diagnosis not present

## 2016-02-28 ENCOUNTER — Other Ambulatory Visit: Payer: Self-pay | Admitting: Cardiovascular Disease

## 2016-02-29 ENCOUNTER — Other Ambulatory Visit: Payer: Self-pay | Admitting: *Deleted

## 2016-02-29 MED ORDER — ATORVASTATIN CALCIUM 20 MG PO TABS: 20.0000 mg | ORAL_TABLET | Freq: Every day | ORAL | 11 refills | Status: DC

## 2016-03-03 DIAGNOSIS — H25012 Cortical age-related cataract, left eye: Secondary | ICD-10-CM | POA: Diagnosis not present

## 2016-03-03 DIAGNOSIS — H401122 Primary open-angle glaucoma, left eye, moderate stage: Secondary | ICD-10-CM | POA: Diagnosis not present

## 2016-03-03 DIAGNOSIS — H2013 Chronic iridocyclitis, bilateral: Secondary | ICD-10-CM | POA: Diagnosis not present

## 2016-03-03 DIAGNOSIS — H401113 Primary open-angle glaucoma, right eye, severe stage: Secondary | ICD-10-CM | POA: Diagnosis not present

## 2016-03-03 DIAGNOSIS — Z9889 Other specified postprocedural states: Secondary | ICD-10-CM | POA: Diagnosis not present

## 2016-03-03 DIAGNOSIS — H2512 Age-related nuclear cataract, left eye: Secondary | ICD-10-CM | POA: Diagnosis not present

## 2016-03-07 ENCOUNTER — Other Ambulatory Visit: Payer: Self-pay | Admitting: Cardiovascular Disease

## 2016-03-10 ENCOUNTER — Other Ambulatory Visit: Payer: Self-pay | Admitting: *Deleted

## 2016-03-10 MED ORDER — METOPROLOL TARTRATE 50 MG PO TABS: 50.0000 mg | ORAL_TABLET | Freq: Two times a day (BID) | ORAL | 0 refills | Status: DC

## 2016-03-13 DIAGNOSIS — H40023 Open angle with borderline findings, high risk, bilateral: Secondary | ICD-10-CM | POA: Diagnosis not present

## 2016-03-13 DIAGNOSIS — H2512 Age-related nuclear cataract, left eye: Secondary | ICD-10-CM | POA: Diagnosis not present

## 2016-03-17 DIAGNOSIS — H20013 Primary iridocyclitis, bilateral: Secondary | ICD-10-CM | POA: Diagnosis not present

## 2016-03-17 DIAGNOSIS — H2013 Chronic iridocyclitis, bilateral: Secondary | ICD-10-CM | POA: Diagnosis not present

## 2016-03-18 ENCOUNTER — Emergency Department (HOSPITAL_BASED_OUTPATIENT_CLINIC_OR_DEPARTMENT_OTHER)
Admission: EM | Admit: 2016-03-18 | Discharge: 2016-03-19 | Disposition: A | Discharge status: Home / Self Care | Payer: Medicare Other | Attending: Emergency Medicine | Admitting: Emergency Medicine

## 2016-03-18 DIAGNOSIS — I10 Essential (primary) hypertension: Secondary | ICD-10-CM | POA: Diagnosis not present

## 2016-03-18 DIAGNOSIS — Z7982 Long term (current) use of aspirin: Secondary | ICD-10-CM | POA: Insufficient documentation

## 2016-03-18 DIAGNOSIS — I2510 Atherosclerotic heart disease of native coronary artery without angina pectoris: Secondary | ICD-10-CM | POA: Diagnosis not present

## 2016-03-18 DIAGNOSIS — Z7984 Long term (current) use of oral hypoglycemic drugs: Secondary | ICD-10-CM | POA: Insufficient documentation

## 2016-03-18 DIAGNOSIS — R112 Nausea with vomiting, unspecified: Secondary | ICD-10-CM | POA: Diagnosis not present

## 2016-03-18 DIAGNOSIS — R630 Anorexia: Secondary | ICD-10-CM | POA: Diagnosis not present

## 2016-03-18 DIAGNOSIS — R5383 Other fatigue: Secondary | ICD-10-CM | POA: Diagnosis not present

## 2016-03-18 DIAGNOSIS — Z87891 Personal history of nicotine dependence: Secondary | ICD-10-CM | POA: Insufficient documentation

## 2016-03-18 DIAGNOSIS — R1032 Left lower quadrant pain: Secondary | ICD-10-CM | POA: Insufficient documentation

## 2016-03-18 DIAGNOSIS — R197 Diarrhea, unspecified: Secondary | ICD-10-CM | POA: Diagnosis not present

## 2016-03-18 DIAGNOSIS — R109 Unspecified abdominal pain: Secondary | ICD-10-CM | POA: Diagnosis not present

## 2016-03-18 DIAGNOSIS — E119 Type 2 diabetes mellitus without complications: Secondary | ICD-10-CM | POA: Diagnosis not present

## 2016-03-19 ENCOUNTER — Encounter (HOSPITAL_BASED_OUTPATIENT_CLINIC_OR_DEPARTMENT_OTHER): Payer: Self-pay | Admitting: *Deleted

## 2016-03-19 ENCOUNTER — Emergency Department (HOSPITAL_BASED_OUTPATIENT_CLINIC_OR_DEPARTMENT_OTHER): Payer: Medicare Other

## 2016-03-19 DIAGNOSIS — R112 Nausea with vomiting, unspecified: Secondary | ICD-10-CM | POA: Diagnosis not present

## 2016-03-19 DIAGNOSIS — R109 Unspecified abdominal pain: Secondary | ICD-10-CM | POA: Diagnosis not present

## 2016-03-19 LAB — COMPREHENSIVE METABOLIC PANEL
ALK PHOS: 95 U/L (ref 38–126)
ALT: 23 U/L (ref 14–54)
ANION GAP: 9 (ref 5–15)
AST: 27 U/L (ref 15–41)
Albumin: 4 g/dL (ref 3.5–5.0)
BILIRUBIN TOTAL: 0.5 mg/dL (ref 0.3–1.2)
BUN: 14 mg/dL (ref 6–20)
CALCIUM: 8.5 mg/dL — AB (ref 8.9–10.3)
CO2: 24 mmol/L (ref 22–32)
Chloride: 104 mmol/L (ref 101–111)
Creatinine, Ser: 0.61 mg/dL (ref 0.44–1.00)
GLUCOSE: 110 mg/dL — AB (ref 65–99)
Potassium: 3.6 mmol/L (ref 3.5–5.1)
Sodium: 137 mmol/L (ref 135–145)
TOTAL PROTEIN: 6.8 g/dL (ref 6.5–8.1)

## 2016-03-19 LAB — URINALYSIS, ROUTINE W REFLEX MICROSCOPIC
BILIRUBIN URINE: NEGATIVE
Glucose, UA: NEGATIVE mg/dL
Hgb urine dipstick: NEGATIVE
KETONES UR: NEGATIVE mg/dL
Leukocytes, UA: NEGATIVE
NITRITE: NEGATIVE
PH: 6 (ref 5.0–8.0)
Protein, ur: NEGATIVE mg/dL
Specific Gravity, Urine: 1.009 (ref 1.005–1.030)

## 2016-03-19 LAB — CBC WITH DIFFERENTIAL/PLATELET
Basophils Absolute: 0 10*3/uL (ref 0.0–0.1)
Basophils Relative: 0 %
Eosinophils Absolute: 0.2 10*3/uL (ref 0.0–0.7)
Eosinophils Relative: 3 %
HEMATOCRIT: 36.4 % (ref 36.0–46.0)
HEMOGLOBIN: 12 g/dL (ref 12.0–15.0)
LYMPHS ABS: 2.2 10*3/uL (ref 0.7–4.0)
Lymphocytes Relative: 38 %
MCH: 27.8 pg (ref 26.0–34.0)
MCHC: 33 g/dL (ref 30.0–36.0)
MCV: 84.3 fL (ref 78.0–100.0)
MONO ABS: 0.6 10*3/uL (ref 0.1–1.0)
MONOS PCT: 10 %
NEUTROS ABS: 2.7 10*3/uL (ref 1.7–7.7)
NEUTROS PCT: 49 %
Platelets: 190 10*3/uL (ref 150–400)
RBC: 4.32 MIL/uL (ref 3.87–5.11)
RDW: 13.7 % (ref 11.5–15.5)
WBC: 5.7 10*3/uL (ref 4.0–10.5)

## 2016-03-19 LAB — LIPASE, BLOOD: LIPASE: 27 U/L (ref 11–51)

## 2016-03-19 MED ORDER — IOPAMIDOL (ISOVUE-300) INJECTION 61%
100.0000 mL | Freq: Once | INTRAVENOUS | Status: AC | PRN
  Administered 2016-03-19: 100 mL via INTRAVENOUS

## 2016-03-19 MED ORDER — SODIUM CHLORIDE 0.9 % IV BOLUS (SEPSIS)
1000.0000 mL | Freq: Once | INTRAVENOUS | Status: AC
  Administered 2016-03-19: 1000 mL via INTRAVENOUS

## 2016-03-19 MED ORDER — ONDANSETRON HCL 4 MG/2ML IJ SOLN
4.0000 mg | Freq: Once | INTRAMUSCULAR | Status: AC
Start: 2016-03-19 — End: 2016-03-19
  Administered 2016-03-19: 4 mg via INTRAVENOUS
  Filled 2016-03-19: qty 2

## 2016-03-19 MED ORDER — ONDANSETRON HCL 4 MG PO TABS: 4.0000 mg | ORAL_TABLET | Freq: Four times a day (QID) | ORAL | 0 refills | Status: DC

## 2016-03-19 NOTE — ED Notes (Signed)
Pt tolerating PO fluids and food well

## 2016-03-19 NOTE — Discharge Instructions (Signed)
Keep yourself hydrated.  Take the nausea medication as prescribed.  Follow-up with your doctor.  Return to the ED if you develop new or worsening symptoms. °

## 2016-03-19 NOTE — ED Provider Notes (Signed)
[facility] Provider Note   CSN: [id number] Arrival date & time: 03/18/16  2354     History   Chief Complaint Chief Complaint  Patient presents with  . Emesis    HPI Sheila Reynolds is a 71 y.o. female.  Patient presents with a three-day history of vomiting, diarrhea, and abdominal pain. Reports started as vomiting which seems to have subsided but diarrhea has become more profuse. States she's had too many episodes to count today. Last episode of vomiting was yesterday. Has had chills but no fever. Husband is here with similar symptoms. No recent travel or antibiotic use. No blood in emesis or stool. No chest pain or shortness of breath. Abdominal pain is lower and constant. It is worse with palpation. Patient has had multiple loose stools today but no vomiting. Was able to tolerate liquids today. Stools have been nonbloody.   The history is provided by the patient and a relative.  Emesis   Associated symptoms include abdominal pain and diarrhea. Pertinent negatives include no arthralgias, no cough, no fever, no headaches and no myalgias.    Past Medical History:  Diagnosis Date  . Anemia    3 months ago anemic  . Anxiety    on meds  . Bronchitis with emphysema   . Cancer ([place])   . Chest pain   . Coronary artery disease    Mild to moderate CAD  . Diabetes mellitus without complication ([place])   . Dysrhythmia    has fluttering at times  . Family history of anesthesia complication    daughter N/V  . Fibromyalgia   . GERD (gastroesophageal reflux disease)    on meds  . Headache(784.0)   . Heart murmur   . Hx of echocardiogram    Echo (03/2013):  Tech limited; Mild focal basal septal hypertrophy, EF 60-65%, normal RVF  . Hyperlipidemia   . Hypertension   . Nonsustained ventricular tachycardia ([place])   . Palpitations   . PVC (premature ventricular contraction)    a. Holter 12/16: NSR, occ PAC,PVCs  . Shortness of breath    loses breathe at times and have  to take extra breathes  . Sleep apnea    CPAP    Patient Active Problem List   Diagnosis Date Noted  . Acute maxillary sinusitis 05/03/2014  . Obstructive sleep apnea 11/04/2013  . Multiple lung nodules 07/18/2012  . Palpitations 03/11/2012  . HTN (hypertension) 03/11/2012  . GERD (gastroesophageal reflux disease) 07/13/2011  . Sarcoidosis ([place]) 02/08/2011  . Chronic bronchitis ([place]) 02/05/2011  . Hyperlipemia 09/24/2010  . Atypical chest pain 09/24/2010  . Nonsustained ventricular tachycardia ([place]) 09/24/2010  . Leg edema 09/24/2010    Past Surgical History:  Procedure Laterality Date  . BREAST SURGERY  approx 1992   bilateral breast surgery for fibrocystic breast disease  . CARDIAC CATHETERIZATION  05/04/2007   reveals overall normal left ventricular systolic function. Ejection fraction 65-70%  . CATARACT EXTRACTION W/PHACO Right 07/20/2013   Procedure: CATARACT EXTRACTION PHACO AND INTRAOCULAR LENS PLACEMENT (IOC);  Surgeon: Marylynn Pearson, MD;  Location: [facility];  Service: Ophthalmology;  Laterality: Right;  . COLONOSCOPY W/ BIOPSIES AND POLYPECTOMY    . COLONOSCOPY WITH PROPOFOL N/A 09/07/2014   Procedure: COLONOSCOPY WITH PROPOFOL;  Surgeon: Juanita Craver, MD;  Location: [facility];  Service: Endoscopy;  Laterality: N/A;  . ESOPHAGOGASTRODUODENOSCOPY (EGD) WITH PROPOFOL N/A 09/07/2014   Procedure: ESOPHAGOGASTRODUODENOSCOPY (EGD) WITH PROPOFOL;  Surgeon: Juanita Craver, MD;  Location: [facility];  Service: Endoscopy;  Laterality: N/A;  . EXTERNAL EAR SURGERY     tumors removed  . GLAUCOMA SURGERY    . MINI SHUNT INSERTION Right 07/20/2013   Procedure: INSERTION OF GLAUCOMA FILTRATION DEVICE RIGHT EYE;  Surgeon: Marylynn Pearson, MD;  Location: [facility];  Service: Ophthalmology;  Laterality: Right;  . MITOMYCIN C APPLICATION Right [id number]   Procedure: MITOMYCIN C APPLICATION;  Surgeon: Marylynn Pearson, MD;  Location: [facility];  Service: Ophthalmology;  Laterality: Right;  . MITOMYCIN C  APPLICATION Right [id number]   Procedure: MITOMYCIN C APPLICATION RIGHT EYE;  Surgeon: Marylynn Pearson, MD;  Location: [facility];  Service: Ophthalmology;  Laterality: Right;  . TONSILLECTOMY    . TOTAL ABDOMINAL HYSTERECTOMY  1982  . TRABECULECTOMY Right 02/21/2015   Procedure: TRABECULECTOMY WITH [facility] ON THE RIGHT EYE;  Surgeon: Marylynn Pearson, MD;  Location: [facility];  Service: Ophthalmology;  Laterality: Right;    OB History    Gravida Para Term Preterm AB Living   2 2       2    SAB TAB Ectopic Multiple Live Births                   Home Medications    Prior to Admission medications   Medication Sig Start Date End Date Taking? Authorizing Provider  acetaZOLAMIDE (DIAMOX) 250 MG tablet Take 1 tablet by mouth 3 (three) times daily. 08/24/15 09/23/15  Historical Provider, MD  albuterol (PROVENTIL) (2.5 MG/3ML) 0.083% nebulizer solution INHALE 2 ML(S) BY NEBULIZATION 3 TIMES A DAY AS NEEDED FOR EXACERBATIONS 12/11/14   Historical Provider, MD  ALPRAZolam Duanne Moron) 0.5 MG tablet Take 0.25 mg by mouth at bedtime as needed for anxiety or sleep. For sleep    Historical Provider, MD  aspirin EC 81 MG tablet Take 81 mg by mouth daily.    Historical Provider, MD  atorvastatin (LIPITOR) 20 MG tablet Take 1 tablet (20 mg total) by mouth daily. 02/29/16   Thayer Headings, MD  brimonidine (ALPHAGAN P) 0.1 % SOLN Place 1 drop into both eyes 2 (two) times daily. Reported on 02/15/2015    Historical Provider, MD  brinzolamide (AZOPT) 1 % ophthalmic suspension Place 1 drop into both eyes 3 (three) times daily.     Historical Provider, MD  esomeprazole (NEXIUM) 40 MG capsule Take 40 mg by mouth 2 (two) times daily as needed (reflux).     Historical Provider, MD  fluticasone furoate-vilanterol (BREO ELLIPTA) 100-25 MCG/INH AEPB Inhale 1 puff into the lungs daily. RINSE MOUTH AFTER USE 08/09/15   Deneise Lever, MD  furosemide (LASIX) 20 MG tablet Take 1 tablet (20 mg total) by mouth every 3 (three) days. 07/20/15   Thayer Headings, MD  JANUMET 50-500 MG per tablet Take 1 tablet by mouth Twice daily. 06/05/11   Historical Provider, MD  loteprednol (LOTEMAX) 0.5 % ophthalmic suspension Place 1 drop into the left eye 2 (two) times daily as needed (inflammation).     Historical Provider, MD  metoprolol (LOPRESSOR) 50 MG tablet Take 1 tablet (50 mg total) by mouth 2 (two) times daily. *Pt is overdue for an appt and will need to call and schedule for further refills* 03/10/16   Thayer Headings, MD  montelukast (SINGULAIR) 10 MG tablet Take 10 mg by mouth at bedtime.    Historical Provider, MD  olmesartan (BENICAR) 20 MG tablet Take 20 mg by mouth daily.    Historical Provider, MD  prednisoLONE acetate (PRED FORTE) 1 % ophthalmic suspension Place  1 drop into the right eye 2 (two) times daily.    Historical Provider, MD  pregabalin (LYRICA) 75 MG capsule Take 75 mg by mouth at bedtime.    Historical Provider, MD  PROAIR HFA 108 [phone] Base) MCG/ACT inhaler Inhale 2 puffs into the lungs every 6 (six) hours as needed for wheezing. 08/09/15   Deneise Lever, MD  promethazine-codeine (PHENERGAN WITH CODEINE) 6.25-10 MG/5ML syrup Take 5 mLs by mouth every 6 (six) hours as needed for cough. 08/09/15   Deneise Lever, MD  Travoprost (TRAVATAN OP) Place 1 drop into both eyes at bedtime.     Historical Provider, MD  umeclidinium-vilanterol (ANORO ELLIPTA) 62.5-25 MCG/INH AEPB Inhale 1 puff into the lungs daily. 09/17/15   Deneise Lever, MD    Family History Family History  Problem Relation Age of Onset  . Heart disease Father   . Diabetes Father   . Glaucoma Father   . Cancer Mother     unknown type, ?lung  . Heart disease Paternal Grandmother   . Cancer Paternal Grandmother     unknown type  . Diabetes Brother   . Glaucoma Brother     Social History Social History  Substance Use Topics  . Smoking status: Former Smoker    Packs/day: 1.00    Years: 4.00    Types: Cigarettes    Quit date: 02/11/1980  . Smokeless tobacco:  Never Used  . Alcohol use No     Allergies   Crestor [rosuvastatin calcium]; Shellfish allergy; and Demerol [meperidine]   Review of Systems Review of Systems  Constitutional: Positive for activity change, appetite change and fatigue. Negative for fever.  HENT: Negative for congestion and rhinorrhea.   Respiratory: Negative for cough, chest tightness and shortness of breath.   Cardiovascular: Negative for chest pain.  Gastrointestinal: Positive for abdominal pain, diarrhea, nausea and vomiting.  Genitourinary: Negative for dysuria, hematuria, vaginal bleeding and vaginal discharge.  Musculoskeletal: Negative for arthralgias, back pain and myalgias.  Neurological: Negative for dizziness, weakness, light-headedness and headaches.   A complete 10 system review of systems was obtained and all systems are negative except as noted in the HPI and PMH.    Physical Exam Updated Vital Signs BP 171/87 (BP Location: Left Arm)   Pulse 77   Temp 97.6 F (36.4 C) (Oral)   Resp 17   Ht 5\' 2"  (1.575 m)   Wt 160 lb (72.6 kg)   SpO2 97%   BMI 29.26 kg/m   Physical Exam  Constitutional: She is oriented to person, place, and time. She appears well-developed and well-nourished. No distress.  HENT:  Head: Normocephalic and atraumatic.  Mouth/Throat: No oropharyngeal exudate.  Mildly dry mucus membranes  Eyes: Conjunctivae and EOM are normal. Pupils are equal, round, and reactive to light.  Neck: Normal range of motion. Neck supple.  No meningismus.  Cardiovascular: Normal rate, regular rhythm, normal heart sounds and intact distal pulses.   No murmur heard. Pulmonary/Chest: Effort normal and breath sounds normal. No respiratory distress.  Abdominal: Soft. There is tenderness. There is no rebound and no guarding.  TTP LLQ without guarding or rebound soft  Musculoskeletal: Normal range of motion. She exhibits no edema or tenderness.  Neurological: She is alert and oriented to person,  place, and time. No cranial nerve deficit. She exhibits normal muscle tone. Coordination normal.  No ataxia on finger to nose bilaterally. No pronator drift. 5/5 strength throughout. CN 2-12 intact.Equal grip strength. Sensation intact.  Skin: Skin is warm.  Psychiatric: She has a normal mood and affect. Her behavior is normal.  Nursing note and vitals reviewed.    ED Treatments / Results  Labs (all labs ordered are listed, but only abnormal results are displayed) Labs Reviewed  COMPREHENSIVE METABOLIC PANEL - Abnormal; Notable for the following:       Result Value   Glucose, Bld 110 (*)    Calcium 8.5 (*)    All other components within normal limits  URINALYSIS, ROUTINE W REFLEX MICROSCOPIC - Abnormal; Notable for the following:    APPearance CLOUDY (*)    All other components within normal limits  CBC WITH DIFFERENTIAL/PLATELET  LIPASE, BLOOD    EKG  EKG Interpretation None       Radiology Ct Abdomen Pelvis W Contrast  Result Date: 03/19/2016 CLINICAL DATA:  71 y/o F; left-sided abdominal pain and diarrhea. History of hysterectomy with left oophorectomy. EXAM: CT ABDOMEN AND PELVIS WITH CONTRAST TECHNIQUE: Multidetector CT imaging of the abdomen and pelvis was performed using the standard protocol following bolus administration of intravenous contrast. CONTRAST:  171mL ISOVUE-300 IOPAMIDOL (ISOVUE-300) INJECTION 61% COMPARISON:  [id number] CT abdomen and pelvis. FINDINGS: Lower chest: Breast tissue expanders. No acute finding. Mild coronary artery atherosclerotic calcification. Hepatobiliary: No focal liver abnormality is seen. No gallstones, gallbladder wall thickening, or biliary dilatation. Pancreas: Unremarkable. No pancreatic ductal dilatation or surrounding inflammatory changes. Spleen: Normal in size without focal abnormality. Adrenals/Urinary Tract: Adrenal glands are unremarkable. Kidneys are normal, without renal calculi, focal lesion, or hydronephrosis. Bladder is  unremarkable. Stomach/Bowel: Stomach is within normal limits. Appendix appears normal. No evidence of bowel wall thickening, distention, or inflammatory changes. Vascular/Lymphatic: No significant vascular findings are present. Mild prominence of left-sided mesenteric lymph nodes, probably reactive. Reproductive: Status post hysterectomy. No adnexal masses. Other: Tiny paraumbilical hernia containing fat. No abdominopelvic ascites. Musculoskeletal: No acute or significant osseous findings. Multiple stable bone islands in the spine. IMPRESSION: Mild nonspecific prominence of left-sided mesenteric lymph nodes may reflect a mild underlying enteritis. Otherwise no acute process identified as explanation for pain. Electronically Signed   By: Kristine Garbe M.D.   On: 03/19/2016 02:55    Procedures Procedures (including critical care time)  Medications Ordered in ED Medications  sodium chloride 0.9 % bolus 1,000 mL (0 mLs Intravenous Stopped 03/19/16 0252)  ondansetron (ZOFRAN) injection 4 mg (4 mg Intravenous Given 03/19/16 0023)  iopamidol (ISOVUE-300) 61 % injection 100 mL (100 mLs Intravenous Contrast Given 03/19/16 0233)  sodium chloride 0.9 % bolus 1,000 mL (0 mLs Intravenous Stopped 03/19/16 0432)     Initial Impression / Assessment and Plan / ED Course  I have reviewed the triage vital signs and the nursing notes.  Pertinent labs & imaging results that were available during my care of the patient were reviewed by me and considered in my medical decision making (see chart for details).     Abdominal pain with nausea vomiting and diarrhea. States husband with similar symptoms.  IV fluids given. Labs obtained. CT obtained given patient's localization of pain to left lower quadrant.  IV and by mouth fluids given. Labs are reassuring. Urinalysis is negative. CT negative for acute pathology. Does show probable enteritis.  Tolerating PO in the ED.  No vomiting or diarrhea in the ED. D/w  patient supportive care for likely viral GI illness. Follow up with PCP. Return precautions discussed.  BP 171/87 (BP Location: Left Arm)   Pulse 77   Temp 97.6 F (36.4 C) (  Oral)   Resp 17   Ht 5\' 2"  (1.575 m)   Wt 160 lb (72.6 kg)   SpO2 97%   BMI 29.26 kg/m    Final Clinical Impressions(s) / ED Diagnoses   Final diagnoses:  Nausea vomiting and diarrhea    New Prescriptions New Prescriptions   No medications on file     Ezequiel Essex, MD 03/19/16 [phone]

## 2016-03-19 NOTE — ED Triage Notes (Signed)
C/o n/v/d onset Sunday,  Neck and back pain

## 2016-03-25 DIAGNOSIS — J309 Allergic rhinitis, unspecified: Secondary | ICD-10-CM | POA: Diagnosis not present

## 2016-03-25 DIAGNOSIS — A084 Viral intestinal infection, unspecified: Secondary | ICD-10-CM | POA: Diagnosis not present

## 2016-03-25 DIAGNOSIS — R197 Diarrhea, unspecified: Secondary | ICD-10-CM | POA: Diagnosis not present

## 2016-03-25 DIAGNOSIS — E1165 Type 2 diabetes mellitus with hyperglycemia: Secondary | ICD-10-CM | POA: Diagnosis not present

## 2016-04-23 ENCOUNTER — Encounter: Payer: Self-pay | Admitting: Cardiovascular Disease

## 2016-04-30 ENCOUNTER — Encounter (HOSPITAL_BASED_OUTPATIENT_CLINIC_OR_DEPARTMENT_OTHER): Payer: Self-pay

## 2016-04-30 ENCOUNTER — Emergency Department (HOSPITAL_BASED_OUTPATIENT_CLINIC_OR_DEPARTMENT_OTHER)
Admission: EM | Admit: 2016-04-30 | Discharge: 2016-04-30 | Disposition: A | Discharge status: Home / Self Care | Payer: Medicare Other | Attending: Emergency Medicine | Admitting: Emergency Medicine

## 2016-04-30 ENCOUNTER — Ambulatory Visit: Payer: Medicare Other | Admitting: Cardiovascular Disease

## 2016-04-30 DIAGNOSIS — I2510 Atherosclerotic heart disease of native coronary artery without angina pectoris: Secondary | ICD-10-CM | POA: Diagnosis not present

## 2016-04-30 DIAGNOSIS — R002 Palpitations: Secondary | ICD-10-CM | POA: Insufficient documentation

## 2016-04-30 DIAGNOSIS — Z7982 Long term (current) use of aspirin: Secondary | ICD-10-CM | POA: Diagnosis not present

## 2016-04-30 DIAGNOSIS — I10 Essential (primary) hypertension: Secondary | ICD-10-CM | POA: Diagnosis not present

## 2016-04-30 DIAGNOSIS — Z87891 Personal history of nicotine dependence: Secondary | ICD-10-CM | POA: Diagnosis not present

## 2016-04-30 DIAGNOSIS — E119 Type 2 diabetes mellitus without complications: Secondary | ICD-10-CM | POA: Insufficient documentation

## 2016-04-30 DIAGNOSIS — Z7984 Long term (current) use of oral hypoglycemic drugs: Secondary | ICD-10-CM | POA: Insufficient documentation

## 2016-04-30 LAB — BASIC METABOLIC PANEL
ANION GAP: 7 (ref 5–15)
BUN: 19 mg/dL (ref 6–20)
CALCIUM: 9 mg/dL (ref 8.9–10.3)
CO2: 28 mmol/L (ref 22–32)
CREATININE: 0.74 mg/dL (ref 0.44–1.00)
Chloride: 103 mmol/L (ref 101–111)
GFR calc non Af Amer: >60 mL/min (ref >60)
Glucose, Bld: 111 mg/dL — ABNORMAL HIGH (ref 65–99)
Potassium: 3.9 mmol/L (ref 3.5–5.1)
SODIUM: 138 mmol/L (ref 135–145)

## 2016-04-30 LAB — CBC WITH DIFFERENTIAL/PLATELET
BASOS ABS: 0 10*3/uL (ref 0.0–0.1)
Basophils Relative: 0 %
EOS ABS: 0.1 10*3/uL (ref 0.0–0.7)
EOS PCT: 2 %
HCT: 34.6 % — ABNORMAL LOW (ref 36.0–46.0)
Hemoglobin: 11.3 g/dL — ABNORMAL LOW (ref 12.0–15.0)
Lymphocytes Relative: 30 %
Lymphs Abs: 2.3 10*3/uL (ref 0.7–4.0)
MCH: 27.6 pg (ref 26.0–34.0)
MCHC: 32.7 g/dL (ref 30.0–36.0)
MCV: 84.6 fL (ref 78.0–100.0)
Monocytes Absolute: 0.6 10*3/uL (ref 0.1–1.0)
Monocytes Relative: 7 %
NEUTROS PCT: 61 %
Neutro Abs: 4.5 10*3/uL (ref 1.7–7.7)
PLATELETS: 185 10*3/uL (ref 150–400)
RBC: 4.09 MIL/uL (ref 3.87–5.11)
RDW: 13.8 % (ref 11.5–15.5)
WBC: 7.5 10*3/uL (ref 4.0–10.5)

## 2016-04-30 LAB — TROPONIN I

## 2016-04-30 NOTE — ED Notes (Signed)
ED Provider at bedside. 

## 2016-04-30 NOTE — ED Provider Notes (Signed)
[facility] DEPT [facility] Provider Note   CSN: [id number] Arrival date & time: 04/30/16  2022  By signing my name below, I, Jeanell Sparrow, attest that this documentation has been prepared under the direction and in the presence of Charlesetta Shanks, MD. Electronically Signed: Jeanell Sparrow, Scribe. 04/30/2016. 10:36 PM.  History   Chief Complaint Chief Complaint  Patient presents with  . Palpitations   The history is provided by the patient. No language interpreter was used.   HPI Comments: Sheila Reynolds is a 71 y.o. female with a PMHx of Dysrhythmia who presents to the Emergency Department complaining of intermittent heart palpitations that started a few years ago. Her blood pressure was 175/81 at home tonight with no tachycardia. Her husband reports that he felt her pulse and it felt regular. She still feels a "fluttering" sensation in her neck/chest. She has worn a heart monitor that showed no abnormalities in the past by Dr. Katharina Caper. She was put on 50mg  Metoprolol (2 pills daily). She will be seen again by Dr. Acie Fredrickson on 05/15/16. Sge reports associated mild SOB during episodes. She denies any or other complaints.     PCP: Maximino Greenland, MD  Past Medical History:  Diagnosis Date  . Anemia    3 months ago anemic  . Anxiety    on meds  . Bronchitis with emphysema   . Cancer ([place])   . Chest pain   . Coronary artery disease    Mild to moderate CAD  . Diabetes mellitus without complication ([place])   . Dysrhythmia    has fluttering at times  . Family history of anesthesia complication    daughter N/V  . Fibromyalgia   . GERD (gastroesophageal reflux disease)    on meds  . Headache(784.0)   . Heart murmur   . Hx of echocardiogram    Echo (03/2013):  Tech limited; Mild focal basal septal hypertrophy, EF 60-65%, normal RVF  . Hyperlipidemia   . Hypertension   . Nonsustained ventricular tachycardia ([place])   . Palpitations   . PVC (premature ventricular contraction)    a. Holter  12/16: NSR, occ PAC,PVCs  . Shortness of breath    loses breathe at times and have to take extra breathes  . Sleep apnea    CPAP    Patient Active Problem List   Diagnosis Date Noted  . Acute maxillary sinusitis 05/03/2014  . Obstructive sleep apnea 11/04/2013  . Multiple lung nodules 07/18/2012  . Palpitations 03/11/2012  . HTN (hypertension) 03/11/2012  . GERD (gastroesophageal reflux disease) 07/13/2011  . Sarcoidosis ([place]) 02/08/2011  . Chronic bronchitis ([place]) 02/05/2011  . Hyperlipemia 09/24/2010  . Atypical chest pain 09/24/2010  . Nonsustained ventricular tachycardia ([place]) 09/24/2010  . Leg edema 09/24/2010    Past Surgical History:  Procedure Laterality Date  . BREAST SURGERY  approx 1992   bilateral breast surgery for fibrocystic breast disease  . CARDIAC CATHETERIZATION  05/04/2007   reveals overall normal left ventricular systolic function. Ejection fraction 65-70%  . CATARACT EXTRACTION W/PHACO Right 07/20/2013   Procedure: CATARACT EXTRACTION PHACO AND INTRAOCULAR LENS PLACEMENT (IOC);  Surgeon: Marylynn Pearson, MD;  Location: [facility];  Service: Ophthalmology;  Laterality: Right;  . COLONOSCOPY W/ BIOPSIES AND POLYPECTOMY    . COLONOSCOPY WITH PROPOFOL N/A 09/07/2014   Procedure: COLONOSCOPY WITH PROPOFOL;  Surgeon: Juanita Craver, MD;  [facility];  Service: Endoscopy;  Laterality: N/A;  . ESOPHAGOGASTRODUODENOSCOPY (EGD) WITH PROPOFOL N/A 09/07/2014   Procedure: ESOPHAGOGASTRODUODENOSCOPY (EGD) WITH  PROPOFOL;  Surgeon: Juanita Craver, MD;  [facility];  Service: Endoscopy;  Laterality: N/A;  . EXTERNAL EAR SURGERY     tumors removed  . GLAUCOMA SURGERY    . MINI SHUNT INSERTION Right 07/20/2013   Procedure: INSERTION OF GLAUCOMA FILTRATION DEVICE RIGHT EYE;  Surgeon: Marylynn Pearson, MD;  Location: [facility];  Service: Ophthalmology;  Laterality: Right;  . MITOMYCIN C APPLICATION Right 0/09/6759   Procedure: MITOMYCIN C APPLICATION;  Surgeon: Marylynn Pearson,  MD;  Location: [facility];  Service: Ophthalmology;  Laterality: Right;  . MITOMYCIN C APPLICATION Right 9/50/9326   Procedure: MITOMYCIN C APPLICATION RIGHT EYE;  Surgeon: Marylynn Pearson, MD;  Location: [place];  Service: Ophthalmology;  Laterality: Right;  . TONSILLECTOMY    . TOTAL ABDOMINAL HYSTERECTOMY  1982  . TRABECULECTOMY Right 02/21/2015   Procedure: TRABECULECTOMY WITH [facility] ON THE RIGHT EYE;  Surgeon: Marylynn Pearson, MD;  Location: [facility];  Service: Ophthalmology;  Laterality: Right;    OB History    Gravida Para Term Preterm AB Living   2 2       2    SAB TAB Ectopic Multiple Live Births                   Home Medications    Prior to Admission medications   Medication Sig Start Date End Date Taking? Authorizing Provider  TURMERIC PO Take by mouth.   Yes Historical Provider, MD  acetaZOLAMIDE (DIAMOX) 250 MG tablet Take 1 tablet by mouth 3 (three) times daily. 08/24/15 09/23/15  Historical Provider, MD  albuterol (PROVENTIL) (2.5 MG/3ML) 0.083% nebulizer solution INHALE 2 ML(S) BY NEBULIZATION 3 TIMES A DAY AS NEEDED FOR EXACERBATIONS 12/11/14   Historical Provider, MD  ALPRAZolam Duanne Moron) 0.5 MG tablet Take 0.25 mg by mouth at bedtime as needed for anxiety or sleep. For sleep    Historical Provider, MD  aspirin EC 81 MG tablet Take 81 mg by mouth daily.    Historical Provider, MD  atorvastatin (LIPITOR) 20 MG tablet Take 1 tablet (20 mg total) by mouth daily. 02/29/16   Thayer Headings, MD  brimonidine (ALPHAGAN P) 0.1 % SOLN Place 1 drop into both eyes 2 (two) times daily. Reported on 02/15/2015    Historical Provider, MD  brinzolamide (AZOPT) 1 % ophthalmic suspension Place 1 drop into both eyes 3 (three) times daily.     Historical Provider, MD  esomeprazole (NEXIUM) 40 MG capsule Take 40 mg by mouth 2 (two) times daily as needed (reflux).     Historical Provider, MD  fluticasone furoate-vilanterol (BREO ELLIPTA) 100-25 MCG/INH AEPB Inhale 1 puff into the lungs daily. RINSE MOUTH AFTER USE  08/09/15   Deneise Lever, MD  furosemide (LASIX) 20 MG tablet Take 1 tablet (20 mg total) by mouth every 3 (three) days. 07/20/15   Thayer Headings, MD  JANUMET 50-500 MG per tablet Take 1 tablet by mouth Twice daily. 06/05/11   Historical Provider, MD  loteprednol (LOTEMAX) 0.5 % ophthalmic suspension Place 1 drop into the left eye 2 (two) times daily as needed (inflammation).     Historical Provider, MD  metoprolol (LOPRESSOR) 50 MG tablet Take 1 tablet (50 mg total) by mouth 2 (two) times daily. *Pt is overdue for an appt and will need to call and schedule for further refills* 03/10/16   Thayer Headings, MD  montelukast (SINGULAIR) 10 MG tablet Take 10 mg by mouth at bedtime.    Historical Provider, MD  olmesartan (  BENICAR) 20 MG tablet Take 20 mg by mouth daily.    Historical Provider, MD  ondansetron (ZOFRAN) 4 MG tablet Take 1 tablet (4 mg total) by mouth every 6 (six) hours. 03/19/16   Ezequiel Essex, MD  prednisoLONE acetate (PRED FORTE) 1 % ophthalmic suspension Place 1 drop into the right eye 2 (two) times daily.    Historical Provider, MD  pregabalin (LYRICA) 75 MG capsule Take 75 mg by mouth at bedtime.    Historical Provider, MD  PROAIR HFA 108 [phone] Base) MCG/ACT inhaler Inhale 2 puffs into the lungs every 6 (six) hours as needed for wheezing. 08/09/15   Deneise Lever, MD  promethazine-codeine (PHENERGAN WITH CODEINE) 6.25-10 MG/5ML syrup Take 5 mLs by mouth every 6 (six) hours as needed for cough. 08/09/15   Deneise Lever, MD  Travoprost (TRAVATAN OP) Place 1 drop into both eyes at bedtime.     Historical Provider, MD  umeclidinium-vilanterol (ANORO ELLIPTA) 62.5-25 MCG/INH AEPB Inhale 1 puff into the lungs daily. 09/17/15   Deneise Lever, MD    Family History Family History  Problem Relation Age of Onset  . Heart disease Father   . Diabetes Father   . Glaucoma Father   . Cancer Mother     unknown type, ?lung  . Heart disease Paternal Grandmother   . Cancer Paternal Grandmother       unknown type  . Diabetes Brother   . Glaucoma Brother     Social History Social History  Substance Use Topics  . Smoking status: Former Smoker    Packs/day: 1.00    Years: 4.00    Types: Cigarettes    Quit date: 02/11/1980  . Smokeless tobacco: Never Used  . Alcohol use No     Allergies   Crestor [rosuvastatin calcium]; Shellfish allergy; and Demerol [meperidine]   Review of Systems Review of Systems  A complete 10 system review of systems was obtained and all systems are negative except as noted in the HPI and PMH.   Physical Exam Updated Vital Signs BP (!) 153/77 (BP Location: Right Arm)   Pulse 84   Temp 98.1 F (36.7 C) (Oral)   Resp 18   Ht 5\' 2"  (1.575 m)   Wt 165 lb (74.8 kg)   SpO2 98%   BMI 30.18 kg/m   Physical Exam  Constitutional: She is oriented to person, place, and time. She appears well-developed and well-nourished. No distress.  HENT:  Head: Normocephalic.  Eyes: Conjunctivae and EOM are normal.  Neck: Neck supple.  Cardiovascular: Normal rate, regular rhythm, normal heart sounds and intact distal pulses.   Pulmonary/Chest: Effort normal. No respiratory distress. She has no wheezes. She has no rales.  Abdominal: Soft. She exhibits no distension. There is no tenderness.  Musculoskeletal: Normal range of motion. She exhibits edema.  Ankles with 1+ edema.  Neurological: She is alert and oriented to person, place, and time. No cranial nerve deficit. She exhibits normal muscle tone. Coordination normal.  Skin: Skin is warm and dry.  Psychiatric: She has a normal mood and affect.  Nursing note and vitals reviewed.    ED Treatments / Results  DIAGNOSTIC STUDIES: Oxygen Saturation is 98% on RA, normal by my interpretation.    COORDINATION OF CARE: 10:40 PM- Pt advised of plan for treatment and pt agrees.  Labs (all labs ordered are listed, but only abnormal results are displayed) Labs Reviewed  BASIC METABOLIC PANEL - Abnormal; Notable for  the following:  Result Value   Glucose, Bld 111 (*)    All other components within normal limits  CBC WITH DIFFERENTIAL/PLATELET - Abnormal; Notable for the following:    Hemoglobin 11.3 (*)    HCT 34.6 (*)    All other components within normal limits  TROPONIN I    EKG  EKG Interpretation  Date/Time:  Wednesday April 30 2016 20:30:09 EDT Ventricular Rate:  82 PR Interval:  130 QRS Duration: 80 QT Interval:  382 QTC Calculation: 446 R Axis:   23 Text Interpretation:  Sinus rhythm with marked sinus arrhythmia Otherwise normal ECG no STEMI. NO sig change from previous Confirmed by Johnney Killian, MD, Jeannie Done [phone]) on 04/30/2016 10:35:17 PM       Radiology No results found.  Procedures Procedures (including critical care time)  Medications Ordered in ED Medications - No data to display   Initial Impression / Assessment and Plan / ED Course  I have reviewed the triage vital signs and the nursing notes.  Pertinent labs & imaging results that were available during my care of the patient were reviewed by me and considered in my medical decision making (see chart for details).      Final Clinical Impressions(s) / ED Diagnoses   Final diagnoses:  Palpitations   Patient with long-standing symptoms of palpitations. Patient reports she's had cardiac workup and her cardiologist told her that they didn't find anything wrong. She describes having worn a 30 day Holter monitor without significant findings. She was monitoring vital signs at home and her blood pressure was 175\81 while she was having symptoms. Her husband reports that he also felt her pulse while she was having symptoms and it was regular and normal rate. Basic labs do not show electrolyte only. Cardiac enzymes are negative. EKG is unchanged. At this time I feel the patient is stable for continued outpatient management and follow-up with her cardiologist. She reports the metoprolol that she takes makes her very fatigued  and at this time does not wish to try an increased dose. New Prescriptions New Prescriptions   No medications on file       Charlesetta Shanks, MD 04/30/16 2335

## 2016-04-30 NOTE — ED Triage Notes (Signed)
c/o intermittent palpitations x 1 month-NAD-slow steady gait

## 2016-05-05 DIAGNOSIS — H2013 Chronic iridocyclitis, bilateral: Secondary | ICD-10-CM | POA: Diagnosis not present

## 2016-05-07 ENCOUNTER — Encounter: Payer: Self-pay | Admitting: Cardiovascular Disease

## 2016-05-07 ENCOUNTER — Ambulatory Visit (INDEPENDENT_AMBULATORY_CARE_PROVIDER_SITE_OTHER): Payer: Medicare Other | Admitting: Cardiovascular Disease

## 2016-05-07 VITALS — BP 160/94 | HR 71 | Ht 62.0 in | Wt 162.4 lb

## 2016-05-07 DIAGNOSIS — I10 Essential (primary) hypertension: Secondary | ICD-10-CM

## 2016-05-07 DIAGNOSIS — I4729 Other ventricular tachycardia: Secondary | ICD-10-CM

## 2016-05-07 DIAGNOSIS — E782 Mixed hyperlipidemia: Secondary | ICD-10-CM

## 2016-05-07 DIAGNOSIS — R002 Palpitations: Secondary | ICD-10-CM | POA: Diagnosis not present

## 2016-05-07 DIAGNOSIS — I472 Ventricular tachycardia: Secondary | ICD-10-CM | POA: Diagnosis not present

## 2016-05-07 LAB — LIPID PANEL
CHOLESTEROL TOTAL: 133 mg/dL (ref 100–199)
Chol/HDL Ratio: 2.7 ratio units (ref 0.0–4.4)
HDL: 50 mg/dL (ref >39)
LDL CALC: 67 mg/dL (ref 0–99)
Triglycerides: 80 mg/dL (ref 0–149)
VLDL CHOLESTEROL CAL: 16 mg/dL (ref 5–40)

## 2016-05-07 NOTE — Progress Notes (Signed)
Cardiology Office Note   Date:  05/07/2016   ID:  Sheila Reynolds, Sheila Reynolds [birth year], MRN [id number]  PCP:  Maximino Greenland, MD  Cardiologist:   Mertie Moores, MD   Chief Complaint  Patient presents with  . Follow-up    HTN   1. Hyperlipidemia 2. Chest pain 3. Nonsustained ventricular tachycardia 4. Mild to moderate coronary artery disease by cath in 2009. 5. 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 Darrick Penna is a 70 y.o. female with the above noted hx. She has continued to have some palpitations. She has occasional episodes of lightheadedness when she has palpitations. Her main issue has been lots of total body cramps. She has been seen in the emergency room. Her lab work looked fine.  She's had lots of dyspnea especially with exertion. She has a history of sarcoidosis and is followed by Dr. Annamaria Boots.  March 11, 2012: Sheila Reynolds has had more palpitations recently. It Appears that her metoprolol dose was decreased slightly. We placed an event monitor on her. She did not have any episodes of atrial fibrillation. She has occasional drinker atrial contractions and occasional premature ventricular contractions. There were no life-threatening arrhythmias.  She is doing well from a cardiac standpoint. She's not had any episodes of syncope. She has significant shortness of breath especially when climbing stairs. This is likely due to her sarcoidosis. She denies any chest pain. Her BP is a bit elevated. She avoids salt.   May 02, 2013 :  She presented to see Richardson Dopp in February with some increased palpitations and lightheadedness. He recommended that she decrease her caffeine intake. He placed a 21 day event monitor and instructed her to take an extra metoprolol as needed.  The palpitations can occur at any time. They occur with rest and with exertion. They're not constant. The last 30-45 seconds and occur multiple times through the day.  Sept. 16, 2015:  Still  having palpitations. She has had more leg edema and her medical doctor on Lasix which he now takes about every other day. She was not on potassium replacement. This may be the cause of her increased PVCs.   May 24, 2014:  Sheila Reynolds is a 71 y.o. female who presents for follow up of her palpitations   Still has these palpitations.  No syncope.   Jan. 10, 2017:  Doing ok Still has DOE  Has palpitations on occasion. Not getting much exercise .  expalined a typical exercise program .   Start slow, gradually increase   May 07, 2016;   Sheila Reynolds is seen today for follow up .  Has been having more palpitations Seem to be getting worse  Went to there ER last week -  the ER note does not describe any specific arrhythmias. No passing out but she did get lightheaded when she had palpitations on one occasion   Has rare episodes of atypical CP .  Lasts a second Not associated with exertion .  Walks 1.5 miles 3-4 times a week .   This walking does not cause any palpitation of CP   Past Medical History:  Diagnosis Date  . Anemia    3 months ago anemic  . Anxiety    on meds  . Bronchitis with emphysema   . Cancer ([place])   . Chest pain   . Coronary artery disease    Mild to moderate CAD  . Diabetes mellitus without complication ([place])   . Dysrhythmia  has fluttering at times  . Family history of anesthesia complication    daughter N/V  . Fibromyalgia   . GERD (gastroesophageal reflux disease)    on meds  . Headache(784.0)   . Heart murmur   . Hx of echocardiogram    Echo (03/2013):  Tech limited; Mild focal basal septal hypertrophy, EF 60-65%, normal RVF  . Hyperlipidemia   . Hypertension   . Nonsustained ventricular tachycardia ([place])   . Palpitations   . PVC (premature ventricular contraction)    a. Holter 12/16: NSR, occ PAC,PVCs  . Shortness of breath    loses breathe at times and have to take extra breathes  . Sleep apnea    CPAP    Past Surgical History:   Procedure Laterality Date  . BREAST SURGERY  approx 1992   bilateral breast surgery for fibrocystic breast disease  . CARDIAC CATHETERIZATION  05/04/2007   reveals overall normal left ventricular systolic function. Ejection fraction 65-70%  . CATARACT EXTRACTION W/PHACO Right 07/20/2013   Procedure: CATARACT EXTRACTION PHACO AND INTRAOCULAR LENS PLACEMENT (IOC);  Surgeon: Marylynn Pearson, MD;  Location: [facility];  Service: Ophthalmology;  Laterality: Right;  . COLONOSCOPY W/ BIOPSIES AND POLYPECTOMY    . COLONOSCOPY WITH PROPOFOL N/A 09/07/2014   Procedure: COLONOSCOPY WITH PROPOFOL;  Surgeon: Juanita Craver, MD;  [facility];  Service: Endoscopy;  Laterality: N/A;  . ESOPHAGOGASTRODUODENOSCOPY (EGD) WITH PROPOFOL N/A 09/07/2014   Procedure: ESOPHAGOGASTRODUODENOSCOPY (EGD) WITH PROPOFOL;  Surgeon: Juanita Craver, MD;  [facility];  Service: Endoscopy;  Laterality: N/A;  . EXTERNAL EAR SURGERY     tumors removed  . GLAUCOMA SURGERY    . MINI SHUNT INSERTION Right 07/20/2013   Procedure: INSERTION OF GLAUCOMA FILTRATION DEVICE RIGHT EYE;  Surgeon: Marylynn Pearson, MD;  Location: [place];  Service: Ophthalmology;  Laterality: Right;  . MITOMYCIN C APPLICATION Right 5/00/9381   Procedure: MITOMYCIN C APPLICATION;  Surgeon: Marylynn Pearson, MD;  Location: [place];  Service: Ophthalmology;  Laterality: Right;  . MITOMYCIN C APPLICATION Right 10/08/9369   Procedure: MITOMYCIN C APPLICATION RIGHT EYE;  Surgeon: Marylynn Pearson, MD;  Location: [place];  Service: Ophthalmology;  Laterality: Right;  . TONSILLECTOMY    . TOTAL ABDOMINAL HYSTERECTOMY  1982  . TRABECULECTOMY Right 02/21/2015   Procedure: TRABECULECTOMY WITH [facility] ON THE RIGHT EYE;  Surgeon: Marylynn Pearson, MD;  Location: [place];  Service: Ophthalmology;  Laterality: Right;     Current Outpatient Prescriptions  Medication Sig Dispense Refill  . acetaZOLAMIDE (DIAMOX) 250 MG tablet Take 1 tablet by mouth 3 (three) times daily.    [name] aspirin EC 81  MG tablet Take 81 mg by mouth daily.    [name] atorvastatin (LIPITOR) 20 MG tablet Take 1 tablet (20 mg total) by mouth daily. 30 tablet 11  . brimonidine (ALPHAGAN P) 0.1 % SOLN Place 1 drop into the left eye 2 (two) times daily. Reported on 02/15/2015    . brinzolamide (AZOPT) 1 % ophthalmic suspension Place 1 drop into the left eye 3 (three) times daily.     [name] esomeprazole (NEXIUM) 40 MG capsule Take 40 mg by mouth 2 (two) times daily as needed (reflux).     . furosemide (LASIX) 20 MG tablet Take 1 tablet (20 mg total) by mouth every 3 (three) days. 30 tablet 5  . JANUMET 50-500 MG per tablet Take 1 tablet by mouth Twice daily.    [name] loteprednol (LOTEMAX) 0.5 % ophthalmic suspension Place 1 drop into the  left eye 2 (two) times daily as needed (inflammation).     . metoprolol (LOPRESSOR) 50 MG tablet Take 1 tablet (50 mg total) by mouth 2 (two) times daily. *Pt is overdue for an appt and will need to call and schedule for further refills* 30 tablet 0  . montelukast (SINGULAIR) 10 MG tablet Take 10 mg by mouth at bedtime.    [name] olmesartan (BENICAR) 20 MG tablet Take 20 mg by mouth daily.    . pregabalin (LYRICA) 75 MG capsule Take 75 mg by mouth at bedtime.    [name] PROAIR HFA 108 (90 Base) MCG/ACT inhaler Inhale 2 puffs into the lungs every 6 (six) hours as needed for wheezing. 1 Inhaler 12  . Travoprost (TRAVATAN OP) Place 1 drop into both eyes at bedtime.     . TURMERIC PO Take by mouth.     No current facility-administered medications for this visit.     Allergies:   Crestor [rosuvastatin calcium]; Shellfish allergy; and Demerol [meperidine]    Social History:  The patient  reports that she quit smoking about 36 years ago. Her smoking use included Cigarettes. She has a 4.00 pack-year smoking history. She has never used smokeless tobacco. She reports that she does not drink alcohol or use drugs.   Family History:  The patient's family history includes Cancer in her mother and paternal grandmother;  Diabetes in her brother and father; Glaucoma in her brother and father; Heart disease in her father and paternal grandmother.    ROS:  Please see the history of present illness.    Review of Systems: Constitutional:  denies fever, chills, diaphoresis, appetite change and fatigue.  HEENT: denies photophobia, eye pain, redness, hearing loss, ear pain, congestion, sore throat, rhinorrhea, sneezing, neck pain, neck stiffness and tinnitus.  Respiratory: denies SOB, DOE, cough, chest tightness, and wheezing.  Cardiovascular: denies chest pain, palpitations and leg swelling.  Gastrointestinal: denies nausea, vomiting, abdominal pain, diarrhea, constipation, blood in stool.  Genitourinary: denies dysuria, urgency, frequency, hematuria, flank pain and difficulty urinating.  Musculoskeletal: denies  myalgias, back pain, joint swelling, arthralgias and gait problem.   Skin: denies pallor, rash and wound.  Neurological: denies dizziness, seizures, syncope, weakness, light-headedness, numbness and headaches.   Hematological: denies adenopathy, easy bruising, personal or family bleeding history.  Psychiatric/ Behavioral: denies suicidal ideation, mood changes, confusion, nervousness, sleep disturbance and agitation.       All other systems are reviewed and negative.    PHYSICAL EXAM: VS:  BP (!) 160/94 (BP Location: Right Arm, Patient Position: Sitting, Cuff Size: Normal)   Pulse 71   Ht 5\' 2"  (1.575 m)   Wt 162 lb 6.4 oz (73.7 kg)   BMI 29.70 kg/m  , BMI Body mass index is 29.7 kg/m. GEN: Well nourished, well developed, in no acute distress  HEENT: normal  Neck: no JVD, carotid bruits, or masses Cardiac: RRR; no murmurs, rubs, or gallops,no edema  Respiratory:  clear to auscultation bilaterally, normal work of breathing GI: soft, nontender, nondistended, + BS MS: no deformity or atrophy  Skin: warm and dry, no rash Neuro:  Strength and sensation are intact Psych: normal   EKG:  EKG is  ordered today. May 07, 2016:   NSR at 50.     Recent Labs: 03/19/2016: ALT 23 04/30/2016: BUN 19; Creatinine, Ser 0.74; Hemoglobin 11.3; Platelets 185; Potassium 3.9; Sodium 138    Lipid Panel    Component Value Date/Time   CHOL 157 05/24/2014 1650  TRIG 104.0 05/24/2014 1650   HDL 51.70 05/24/2014 1650   CHOLHDL 3 05/24/2014 1650   VLDL 20.8 05/24/2014 1650   LDLCALC 85 05/24/2014 1650      Wt Readings from Last 3 Encounters:  05/07/16 162 lb 6.4 oz (73.7 kg)  04/30/16 165 lb (74.8 kg)  03/19/16 160 lb (72.6 kg)      Other studies Reviewed: Additional studies/ records that were reviewed today include: . Review of the above records demonstrates:    ASSESSMENT AND PLAN:  1. Palpitations: She continues to have episodes of palpitations. We have followed her for years for this same complaint. They seem to be getting a little bit worse. We'll place a 30 day event monitor. She does have a history of nonsustained ventricular tachycardia. She describes some lightheadedness when she has severe palpitations.  2. Hyperlipidemia - continue current medications. We'll check her lipids today.  BMP and liver enzymes were drawn recently and are stable  3. Chest pain - stable , not a current issue. 4. Nonsustained ventricular tachycardia 5. Mild to moderate coronary artery disease by cath in 2009. 6.  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 Keturah Barre   Current medicines are reviewed at length with the patient today.  The patient does not have concerns regarding medicines.  The following changes have been made:  no change  Labs/ tests ordered today include:  No orders of the defined types were placed in this encounter.    Disposition:   FU with me in 3 months     Signed, Mertie Moores, MD  05/07/2016 10:06 AM    [facility] [address], [place], [place]  [address] Phone: [phone]; Fax: [phone]

## 2016-05-07 NOTE — Patient Instructions (Addendum)
Medication Instructions:  Your physician recommends that you continue on your current medications as directed. Please refer to the Current Medication list given to you today.   Labwork: TODAY - cholesterol   Testing/Procedures: Your physician has recommended that you wear an event monitor. Event monitors are medical devices that record the heart's electrical activity. Doctors most often [place] these monitors to diagnose arrhythmias. Arrhythmias are problems with the speed or rhythm of the heartbeat. The monitor is a small, portable device. You can wear one while you do your normal daily activities. This is usually used to diagnose what is causing palpitations/syncope (passing out).   Follow-Up: Your physician recommends that you schedule a follow-up appointment in: 3 months with Dr. Acie Fredrickson   If you need a refill on your cardiac medications before your next appointment, please call your pharmacy.   Thank you for choosing [facility]! Christen Bame, RN [phone]

## 2016-05-09 ENCOUNTER — Ambulatory Visit (INDEPENDENT_AMBULATORY_CARE_PROVIDER_SITE_OTHER): Payer: Medicare Other

## 2016-05-09 DIAGNOSIS — R002 Palpitations: Secondary | ICD-10-CM | POA: Diagnosis not present

## 2016-05-15 ENCOUNTER — Ambulatory Visit: Payer: Medicare Other | Admitting: Physician Assistant

## 2016-05-23 DIAGNOSIS — J209 Acute bronchitis, unspecified: Secondary | ICD-10-CM | POA: Diagnosis not present

## 2016-05-23 DIAGNOSIS — I119 Hypertensive heart disease without heart failure: Secondary | ICD-10-CM | POA: Diagnosis not present

## 2016-05-23 DIAGNOSIS — I2510 Atherosclerotic heart disease of native coronary artery without angina pectoris: Secondary | ICD-10-CM | POA: Diagnosis not present

## 2016-05-23 DIAGNOSIS — E1165 Type 2 diabetes mellitus with hyperglycemia: Secondary | ICD-10-CM | POA: Diagnosis not present

## 2016-05-26 DIAGNOSIS — I119 Hypertensive heart disease without heart failure: Secondary | ICD-10-CM | POA: Diagnosis not present

## 2016-05-26 DIAGNOSIS — E1165 Type 2 diabetes mellitus with hyperglycemia: Secondary | ICD-10-CM | POA: Diagnosis not present

## 2016-05-26 DIAGNOSIS — J209 Acute bronchitis, unspecified: Secondary | ICD-10-CM | POA: Diagnosis not present

## 2016-05-26 DIAGNOSIS — I2510 Atherosclerotic heart disease of native coronary artery without angina pectoris: Secondary | ICD-10-CM | POA: Diagnosis not present

## 2016-06-02 DIAGNOSIS — H401122 Primary open-angle glaucoma, left eye, moderate stage: Secondary | ICD-10-CM | POA: Diagnosis not present

## 2016-06-02 DIAGNOSIS — H401113 Primary open-angle glaucoma, right eye, severe stage: Secondary | ICD-10-CM | POA: Diagnosis not present

## 2016-06-05 ENCOUNTER — Telehealth: Payer: Self-pay | Admitting: Internal Medicine

## 2016-06-05 MED ORDER — PREDNISONE 20 MG PO TABS: 20.0000 mg | ORAL_TABLET | Freq: Every day | ORAL | 0 refills | Status: DC

## 2016-06-05 NOTE — Telephone Encounter (Signed)
Spoke with patient-she will be here on Monday 06/09/16 at 11:30 am. Nothing more needed at this time.

## 2016-06-05 NOTE — Telephone Encounter (Signed)
Offer prednisone 20 mg, # 5, 1 daily.  May also use antihistamine like claritin, otc Mucinex-DM

## 2016-06-05 NOTE — Telephone Encounter (Signed)
Katie please see when you can find time for her in next week or so

## 2016-06-05 NOTE — Telephone Encounter (Signed)
Pt is requesting an appt. Pt states she was prescribed Augmentin by PCP for bronchitis on 4/16. Pt states she has had some improvement with abx. Pt reports of prod cough with yellow mucus, wheezing, occ chest tightness, headache & nasal drainage. Pt has one day left of abx.   CY please advise. Thanks.   Current Outpatient Prescriptions on File Prior to Visit  Medication Sig Dispense Refill  . acetaZOLAMIDE (DIAMOX) 250 MG tablet Take 1 tablet by mouth 3 (three) times daily.    [name] aspirin EC 81 MG tablet Take 81 mg by mouth daily.    [name] atorvastatin (LIPITOR) 20 MG tablet Take 1 tablet (20 mg total) by mouth daily. 30 tablet 11  . brimonidine (ALPHAGAN P) 0.1 % SOLN Place 1 drop into the left eye 2 (two) times daily. Reported on 02/15/2015    . brinzolamide (AZOPT) 1 % ophthalmic suspension Place 1 drop into the left eye 3 (three) times daily.     [name] esomeprazole (NEXIUM) 40 MG capsule Take 40 mg by mouth 2 (two) times daily as needed (reflux).     . furosemide (LASIX) 20 MG tablet Take 1 tablet (20 mg total) by mouth every 3 (three) days. 30 tablet 5  . JANUMET 50-500 MG per tablet Take 1 tablet by mouth Twice daily.    [name] loteprednol (LOTEMAX) 0.5 % ophthalmic suspension Place 1 drop into the left eye 2 (two) times daily as needed (inflammation).     . metoprolol (LOPRESSOR) 50 MG tablet Take 1 tablet (50 mg total) by mouth 2 (two) times daily. *Pt is overdue for an appt and will need to call and schedule for further refills* 30 tablet 0  . montelukast (SINGULAIR) 10 MG tablet Take 10 mg by mouth at bedtime.    [name] olmesartan (BENICAR) 20 MG tablet Take 20 mg by mouth daily.    . pregabalin (LYRICA) 75 MG capsule Take 75 mg by mouth at bedtime.    [name] PROAIR HFA 108 (90 Base) MCG/ACT inhaler Inhale 2 puffs into the lungs every 6 (six) hours as needed for wheezing. 1 Inhaler 12  . Travoprost (TRAVATAN OP) Place 1 drop into both eyes at bedtime.     . TURMERIC PO Take by mouth.     No current  facility-administered medications on file prior to visit.     Allergies  Allergen Reactions  . Crestor [Rosuvastatin Calcium] Other (See Comments)    muscle aches  . Shellfish Allergy Itching and Other (See Comments)    Crab, shrimp and lobster ---lips itch and tingle  . Demerol [Meperidine] Other (See Comments)    Hallucinations

## 2016-06-05 NOTE — Telephone Encounter (Signed)
Pt aware of recs.  rx sent to preferred pharmacy.    Pt requesting an appt as well.  Next available appt isn't until July, pt wishes to be seen before then.  Pt wishes to see CY only.   Please advise if pt can be worked in sooner.  Thanks.

## 2016-06-09 ENCOUNTER — Encounter: Payer: Self-pay | Admitting: Internal Medicine

## 2016-06-09 ENCOUNTER — Ambulatory Visit (INDEPENDENT_AMBULATORY_CARE_PROVIDER_SITE_OTHER): Payer: Medicare Other | Admitting: Internal Medicine

## 2016-06-09 VITALS — BP 120/80 | HR 82 | Ht 62.0 in | Wt 164.4 lb

## 2016-06-09 DIAGNOSIS — J42 Unspecified chronic bronchitis: Secondary | ICD-10-CM

## 2016-06-09 DIAGNOSIS — J302 Other seasonal allergic rhinitis: Secondary | ICD-10-CM | POA: Diagnosis not present

## 2016-06-09 DIAGNOSIS — D869 Sarcoidosis, unspecified: Secondary | ICD-10-CM | POA: Diagnosis not present

## 2016-06-09 MED ORDER — PHENYLEPHRINE HCL 1 % NA SOLN
3.0000 [drp] | Freq: Once | NASAL | Status: AC
  Administered 2016-06-09: 3 [drp] via NASAL

## 2016-06-09 MED ORDER — METHYLPREDNISOLONE ACETATE 80 MG/ML IJ SUSP
80.0000 mg | Freq: Once | INTRAMUSCULAR | Status: AC
  Administered 2016-06-09: 80 mg via INTRAMUSCULAR

## 2016-06-09 NOTE — Patient Instructions (Addendum)
Neb neo nasal  Dx seasonal allergic rhinitis  Depo 80  Finish the prednisone pills  Sample Breo 100 inhaler    Inhale 1 puff then rinse  Mouth, once daily  Stay well hydrated

## 2016-06-09 NOTE — Progress Notes (Signed)
HPI  F former smoker followed for bronchitis, hx  Occular sarcoid, bronchitis/ nodules, OSA complicated by GERD, glaucoma, DM2 ACE 09/17/15-47-WNL PFT: 01/07/2011-FEV1 1.59/79%, FEV1/FVC 0.73, FEF 25-75% improved to 38% with bronchodilator. TLC 90% DLCO 55% Office Spirometry 06/09/16- moderate restriction of exhaled volume. FVC 1.51/68%, FEV1 1.20/70%, ratio 0.79, FEF 25-75% 1.19/73% ------------------------------------------------------------------------------------------------------------------------ 09/17/2015-71 year old female former smoker followed for bronchitis/bronchiolitis/lung nodules, history of ocular sarcoid, OSA, complicated by GERD, glaucoma, DM 2 CPAP 9/ Advanced FOLLOWS FOR: Pt states she continues to cough-productive-clear as well as SOB and wheezing. Could not tell any difference using Breo ACE level 05/03/14- 51 Complains of persistent intermittent left chest wall and upper arm pain relieved by "cracking" her neck. Her eye specialist wanted her to take a new eyedrop for sarcoid but thought it might aggravate her cough. Coughing thick white sputum, + night sweats, no fever, rash or nodes CXR 08/09/2015 FINDINGS: Stable mild fullness of the retrosternal soft tissues most likely mediastinal fat. Mediastinum otherwise unremarkable. Hilar structures unremarkable . Mild bibasilar subsegmental atelectasis and or scarring. No pleural effusion pneumothorax. Heart size normal. No acute bony abnormality. IMPRESSION: Bibasilar subsegmental atelectasis and/or scarring. Electronically Signed   By: Marcello Moores  Register   On: 08/09/2015 10:29   06/09/16- 71 yo female former smoker followed for bronchitis/bronchiolitis/lung nodules, history of ocular sarcoid, OSA, complicated by GERD, glaucoma, DM 2 FOLLOWS FOR: Pt continues to have cough-hard to get rid off-feels slightly better but not much after abx's.Increased SOB as well. Last ACE level 09/17/15- 47(-normal). We sent prednisone 20 mg  daily 5 days, on 4/26. Had gotten Augmentin from PCP on 4/16. Complaining of productive cough, yellow sputum, wheezing, chest tightness, headache, nasal drainage. Hasn't felt well for the last 3 weeks. Began with sore throat and fever and continues with dry nose and throat, hoarseness, vague malaise. Denies fever, pain, adenopathy, rash. Office Spirometry 06/09/16- moderate restriction of exhaled volume. FVC 1.51/68%, FEV1 1.20/70%, ratio 0.79, FEF 25-75% 1.19/73%  ROS-see HPI     + = pos Constitutional:   No-   weight loss, night sweats, fevers, chills, fatigue, lassitude. HEENT:   +  headaches, difficulty swallowing, tooth/dental problems, sore throat,       Some  sneezing, itching, ear ache, +nasal congestion, post nasal drip,  CV:  chest pain, no-orthopnea, PND, swelling in lower extremities, anasarca, dizziness, palpitations Resp: +  shortness of breath with exertion not at rest.            productive cough,  + non-productive cough,  No- coughing up of blood.               in color of mucus.  + wheezing.   Skin: No-   rash or lesions. GI:  +  heartburn, indigestion, abdominal pain, nausea, vomiting,  GU:  MS:  +  joint pain or swelling, + back pain Neuro-     nothing unusual Psych:  No- change in mood or affect. No depression or anxiety.  No memory loss.  OBJ  exam seems stable/baseline General- Alert, Oriented, Affect anxious, Distress- none acute Skin- rash-none, lesions- none, excoriation- none Lymphadenopathy- none Head- atraumatic            Eyes- Gross vision intact, PERRLA, conjunctivae clear secretions- not injected            Ears- Hearing aid Left            Nose- + turbinate edema, no-Septal dev, mucus, polyps, erosion, perforation  Throat- Mallampati III , mucosa clear , drainage- none, tonsils- atrophic Neck- flexible , trachea midline, no stridor , thyroid nl, carotid no bruit Chest - symmetrical excursion , unlabored           Heart/CV- RRR , no murmur ,  no gallop  , no rub, nl s1 s2                           - JVD- none , edema- none, stasis changes- none, varices- none           Lung-  cough , wheeze , dullness-none, rub- none           Chest wall- no rash seen consistent with shingles Abd- No HSM Br/ Gen/ Rectal- Not done, not indicated Extrem- cyanosis- none, clubbing, none, atrophy- none, strength- nl.  Neuro- grossly intact to observation

## 2016-06-12 ENCOUNTER — Telehealth: Payer: Self-pay | Admitting: Nurse Practitioner

## 2016-06-12 MED ORDER — METOPROLOL TARTRATE 50 MG PO TABS: 50.0000 mg | ORAL_TABLET | Freq: Two times a day (BID) | ORAL | 3 refills | Status: DC

## 2016-06-12 NOTE — Telephone Encounter (Signed)
-----   Message from Thayer Headings, MD sent at 06/12/2016 10:27 AM EDT ----- No significant arrhythmias

## 2016-06-12 NOTE — Telephone Encounter (Signed)
Reviewed monitor results with Dr. Acie Fredrickson. I pointed out the amount of tachycardia on monitor report and advised I will call patient to discuss symptoms. He advised that her dose of beta blocker may need to be increased and advised he would like to get a TSH at next office visit on 6/7. I called and spoke with patient. She states she is not having as many palpitations as she was when she came in for ov on 3/28. I asked if she is taking metoprolol 50 mg bid and she states she ran out. She asked for a new Rx to be sent to her pharmacy and also if there is a different medication that would not cause as much fatigue. I advised her to restart metoprolol at 25 mg bid and see if she can tolerate. I advised that if she continues to experience fatigue with lower dose that she can discuss with Dr. Acie Fredrickson at her next office visit. She verbalized understanding and agreement and thanked me for the call.

## 2016-06-16 NOTE — Assessment & Plan Note (Addendum)
Exacerbation of bronchitis and rhinitis with malaise. This may be either viral or allergic or combination at this time of year. Plan-increase fluids, Depo-Medrol. Antihistamines as needed.Sample [facility]

## 2016-06-16 NOTE — Assessment & Plan Note (Signed)
Onset of symptoms is recent I do not think this is related to her sarcoid, which is probably burned out.

## 2016-07-17 ENCOUNTER — Encounter: Payer: Self-pay | Admitting: Cardiovascular Disease

## 2016-07-17 ENCOUNTER — Ambulatory Visit (INDEPENDENT_AMBULATORY_CARE_PROVIDER_SITE_OTHER): Payer: Medicare Other | Admitting: Cardiovascular Disease

## 2016-07-17 ENCOUNTER — Encounter (INDEPENDENT_AMBULATORY_CARE_PROVIDER_SITE_OTHER): Payer: Self-pay

## 2016-07-17 VITALS — BP 118/64 | HR 88 | Ht 61.0 in | Wt 160.4 lb

## 2016-07-17 DIAGNOSIS — M7989 Other specified soft tissue disorders: Secondary | ICD-10-CM | POA: Diagnosis not present

## 2016-07-17 DIAGNOSIS — I5032 Chronic diastolic (congestive) heart failure: Secondary | ICD-10-CM

## 2016-07-17 DIAGNOSIS — R002 Palpitations: Secondary | ICD-10-CM | POA: Diagnosis not present

## 2016-07-17 DIAGNOSIS — E782 Mixed hyperlipidemia: Secondary | ICD-10-CM | POA: Diagnosis not present

## 2016-07-17 NOTE — Patient Instructions (Signed)
Medication Instructions:  Your physician recommends that you continue on your current medications as directed. Please refer to the Current Medication list given to you today.   Labwork: TODAY - TSH, BMET   Testing/Procedures: Your physician has requested that you have a lower or upper extremity venous duplex. This test is an ultrasound of the veins in the legs or arms. It looks at venous blood flow that carries blood from the heart to the legs or arms. Allow one hour for a Lower Venous exam. Allow thirty minutes for an Upper Venous exam. There are no restrictions or special instructions.   Your physician has requested that you have an echocardiogram. Echocardiography is a painless test that uses sound waves to create images of your heart. It provides your doctor with information about the size and shape of your heart and how well your heart's chambers and valves are working. This procedure takes approximately one hour. There are no restrictions for this procedure.   Follow-Up: Your physician wants you to follow-up in: 6 months with Dr. Acie Fredrickson.  You will receive a reminder letter in the mail two months in advance. If you don't receive a letter, please call our office to schedule the follow-up appointment.   If you need a refill on your cardiac medications before your next appointment, please call your pharmacy.   Thank you for choosing [facility]! Christen Bame, RN [phone]

## 2016-07-17 NOTE — Progress Notes (Signed)
Cardiology Office Note   Date:  07/17/2016   ID:  Sheila Reynolds 1945-12-23, MRN [id number]  PCP:  Glendale Chard, MD  Cardiologist:   Mertie Moores, MD   No chief complaint on file.  1. Hyperlipidemia 2. Chest pain 3. Nonsustained ventricular tachycardia 4. Mild to moderate coronary artery disease by cath in 2009. 5. 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 Darrick Penna is a 71 y.o. female with the above noted hx. She has continued to have some palpitations. She has occasional episodes of lightheadedness when she has palpitations. Her main issue has been lots of total body cramps. She has been seen in the emergency room. Her lab work looked fine.  She's had lots of dyspnea especially with exertion. She has a history of sarcoidosis and is followed by Dr. Annamaria Boots.  March 11, 2012: Sheila Reynolds has had more palpitations recently. It Appears that her metoprolol dose was decreased slightly. We placed an event monitor on her. She did not have any episodes of atrial fibrillation. She has occasional drinker atrial contractions and occasional premature ventricular contractions. There were no life-threatening arrhythmias.  She is doing well from a cardiac standpoint. She's not had any episodes of syncope. She has significant shortness of breath especially when climbing stairs. This is likely due to her sarcoidosis. She denies any chest pain. Her BP is a bit elevated. She avoids salt.   May 02, 2013 :  She presented to see Richardson Dopp in February with some increased palpitations and lightheadedness. He recommended that she decrease her caffeine intake. He placed a 21 day event monitor and instructed her to take an extra metoprolol as needed.  The palpitations can occur at any time. They occur with rest and with exertion. They're not constant. The last 30-45 seconds and occur multiple times through the day.  Sept. 16, 2015:  Still having palpitations. She has had more  leg edema and her medical doctor on Lasix which he now takes about every other day. She was not on potassium replacement. This may be the cause of her increased PVCs.   May 24, 2014:  Sheila Reynolds is a 71 y.o. female who presents for follow up of her palpitations   Still has these palpitations.  No syncope.   Jan. 10, 2017:  Doing ok Still has DOE  Has palpitations on occasion. Not getting much exercise .  expalined a typical exercise program .   Start slow, gradually increase   May 07, 2016;   Sheila Reynolds is seen today for follow up .  Has been having more palpitations Seem to be getting worse  Went to there ER last week -  the ER note does not describe any specific arrhythmias. No passing out but she did get lightheaded when she had palpitations on one occasion   Has rare episodes of atypical CP .  Lasts a second Not associated with exertion .  Walks 1.5 miles 3-4 times a week .   This walking does not cause any palpitation of CP   July 17, 2016 Sheila Reynolds is seen today for follow up  30 day monitor showed no significant arrhythmias  Having leg pain and swelling  during night and day  Not worsened by exercise  No CP or dyspnea - still walking 1.5 miles 3-4 days a week    Past Medical History:  Diagnosis Date  . Anemia    3 months ago anemic  . Anxiety    on  meds  . Bronchitis with emphysema   . Cancer ([place])   . Chest pain   . Coronary artery disease    Mild to moderate CAD  . Diabetes mellitus without complication ([place])   . Dysrhythmia    has fluttering at times  . Family history of anesthesia complication    daughter N/V  . Fibromyalgia   . GERD (gastroesophageal reflux disease)    on meds  . Headache(784.0)   . Heart murmur   . Hx of echocardiogram    Echo (03/2013):  Tech limited; Mild focal basal septal hypertrophy, EF 60-65%, normal RVF  . Hyperlipidemia   . Hypertension   . Nonsustained ventricular tachycardia ([place])   . Palpitations   . PVC  (premature ventricular contraction)    a. Holter 12/16: NSR, occ PAC,PVCs  . Shortness of breath    loses breathe at times and have to take extra breathes  . Sleep apnea    CPAP    Past Surgical History:  Procedure Laterality Date  . BREAST SURGERY  approx 1992   bilateral breast surgery for fibrocystic breast disease  . CARDIAC CATHETERIZATION  05/04/2007   reveals overall normal left ventricular systolic function. Ejection fraction 65-70%  . CATARACT EXTRACTION W/PHACO Right 07/20/2013   Procedure: CATARACT EXTRACTION PHACO AND INTRAOCULAR LENS PLACEMENT (IOC);  Surgeon: Marylynn Pearson, MD;  Location: [facility];  Service: Ophthalmology;  Laterality: Right;  . COLONOSCOPY W/ BIOPSIES AND POLYPECTOMY    . COLONOSCOPY WITH PROPOFOL N/A 09/07/2014   Procedure: COLONOSCOPY WITH PROPOFOL;  Surgeon: Juanita Craver, MD;  Location: [facility];  Service: Endoscopy;  Laterality: N/A;  . ESOPHAGOGASTRODUODENOSCOPY (EGD) WITH PROPOFOL N/A 09/07/2014   Procedure: ESOPHAGOGASTRODUODENOSCOPY (EGD) WITH PROPOFOL;  Surgeon: Juanita Craver, MD;  Location: [facility];  Service: Endoscopy;  Laterality: N/A;  . EXTERNAL EAR SURGERY     tumors removed  . GLAUCOMA SURGERY    . MINI SHUNT INSERTION Right 07/20/2013   Procedure: INSERTION OF GLAUCOMA FILTRATION DEVICE RIGHT EYE;  Surgeon: Marylynn Pearson, MD;  Location: [place];  Service: Ophthalmology;  Laterality: Right;  . MITOMYCIN C APPLICATION Right 8/65/7846   Procedure: MITOMYCIN C APPLICATION;  Surgeon: Marylynn Pearson, MD;  Location: [place];  Service: Ophthalmology;  Laterality: Right;  . MITOMYCIN C APPLICATION Right 9/62/9528   Procedure: MITOMYCIN C APPLICATION RIGHT EYE;  Surgeon: Marylynn Pearson, MD;  Location: [place];  Service: Ophthalmology;  Laterality: Right;  . TONSILLECTOMY    . TOTAL ABDOMINAL HYSTERECTOMY  1982  . TRABECULECTOMY Right 02/21/2015   Procedure: TRABECULECTOMY WITH [facility] ON THE RIGHT EYE;  Surgeon: Marylynn Pearson, MD;  Location: [place];  Service:  Ophthalmology;  Laterality: Right;     Current Outpatient Prescriptions  Medication Sig Dispense Refill  . aspirin EC 81 MG tablet Take 81 mg by mouth daily.    [name] atorvastatin (LIPITOR) 20 MG tablet Take 1 tablet (20 mg total) by mouth daily. 30 tablet 11  . brimonidine (ALPHAGAN P) 0.1 % SOLN Place 1 drop into the left eye 2 (two) times daily. Reported on 02/15/2015    . brinzolamide (AZOPT) 1 % ophthalmic suspension Place 1 drop into the left eye 3 (three) times daily.     [name] esomeprazole (NEXIUM) 40 MG capsule Take 40 mg by mouth 2 (two) times daily as needed (reflux).     . furosemide (LASIX) 20 MG tablet Take 1 tablet (20 mg total) by mouth every 3 (three) days. 30 tablet 5  . JANUMET 50-500  MG per tablet Take 1 tablet by mouth Twice daily.    [name] loteprednol (LOTEMAX) 0.5 % ophthalmic suspension Place 1 drop into the left eye 2 (two) times daily as needed (inflammation).     . metoprolol (LOPRESSOR) 50 MG tablet Take 1 tablet (50 mg total) by mouth 2 (two) times daily. May start with 25 mg twice daily for first few weeks 180 tablet 3  . montelukast (SINGULAIR) 10 MG tablet Take 10 mg by mouth at bedtime.    [name] olmesartan (BENICAR) 20 MG tablet Take 20 mg by mouth daily.    . pregabalin (LYRICA) 75 MG capsule Take 75 mg by mouth at bedtime.    [name] PROAIR HFA 108 (90 Base) MCG/ACT inhaler Inhale 2 puffs into the lungs every 6 (six) hours as needed for wheezing. 1 Inhaler 12  . Travoprost (TRAVATAN OP) Place 1 drop into both eyes at bedtime.     . TURMERIC PO Take by mouth.     No current facility-administered medications for this visit.     Allergies:   Crestor [rosuvastatin calcium]; Shellfish allergy; and Demerol [meperidine]    Social History:  The patient  reports that she quit smoking about 36 years ago. Her smoking use included Cigarettes. She has a 4.00 pack-year smoking history. She has never used smokeless tobacco. She reports that she does not drink alcohol or use drugs.   Family  History:  The patient's family history includes Cancer in her mother and paternal grandmother; Diabetes in her brother and father; Glaucoma in her brother and father; Heart disease in her father and paternal grandmother.    ROS:  Please see the history of present illness.    Review of Systems: Constitutional:  denies fever, chills, diaphoresis, appetite change and fatigue.  HEENT: denies photophobia, eye pain, redness, hearing loss, ear pain, congestion, sore throat, rhinorrhea, sneezing, neck pain, neck stiffness and tinnitus.  Respiratory: denies SOB, DOE, cough, chest tightness, and wheezing.  Cardiovascular: denies chest pain, palpitations and leg swelling.  Gastrointestinal: denies nausea, vomiting, abdominal pain, diarrhea, constipation, blood in stool.  Genitourinary: denies dysuria, urgency, frequency, hematuria, flank pain and difficulty urinating.  Musculoskeletal: denies  myalgias, back pain, joint swelling, arthralgias and gait problem.   Skin: denies pallor, rash and wound.  Neurological: denies dizziness, seizures, syncope, weakness, light-headedness, numbness and headaches.   Hematological: denies adenopathy, easy bruising, personal or family bleeding history.  Psychiatric/ Behavioral: denies suicidal ideation, mood changes, confusion, nervousness, sleep disturbance and agitation.       All other systems are reviewed and negative.    PHYSICAL EXAM: VS:  BP 118/64   Pulse 88   Ht 5\' 1"  (1.549 m)   Wt 160 lb 6 oz (72.7 kg)   SpO2 97%   BMI 30.30 kg/m  , BMI Body mass index is 30.3 kg/m. GEN: Well nourished, well developed, in no acute distress  HEENT: normal  Neck: no JVD, carotid bruits, or masses Cardiac: RRR; no murmurs, rubs, or gallops, trace - 1+ bilateral  edema  Respiratory:  clear to auscultation bilaterally, normal work of breathing GI: soft, nontender, nondistended, + BS MS: no deformity or atrophy  Skin: warm and dry, no rash Neuro:  Strength and  sensation are intact Psych: normal   EKG:  EKG is ordered today. May 07, 2016:   NSR at 70.     Recent Labs: 03/19/2016: ALT 23 04/30/2016: BUN 19; Creatinine, Ser 0.74; Hemoglobin 11.3; Platelets 185; Potassium 3.9; Sodium 138  Lipid Panel    Component Value Date/Time   CHOL 133 05/07/2016 1031   TRIG 80 05/07/2016 1031   HDL 50 05/07/2016 1031   CHOLHDL 2.7 05/07/2016 1031   CHOLHDL 3 05/24/2014 1650   VLDL 20.8 05/24/2014 1650   LDLCALC 67 05/07/2016 1031      Wt Readings from Last 3 Encounters:  07/17/16 160 lb 6 oz (72.7 kg)  06/09/16 164 lb 6.4 oz (74.6 kg)  05/07/16 162 lb 6.4 oz (73.7 kg)      Other studies Reviewed: Additional studies/ records that were reviewed today include: . Review of the above records demonstrates:    ASSESSMENT AND PLAN:  1. Palpitations: 30 day event monitor shows no signficant arrhythmias   2.  Leg edema:  Distal foot pulses are excellent . Has 1+ edema bilaterally  Echo from 2016 reveals normal left ventricular systolic function. She does have mild diastolic dysfunction. She has no significant valvular disease. Her basic metabolic profile from March, 2018  shows normal kidney function.  Will repeat today  Will get lower extremity venous duplex scan   2. Hyperlipidemia - continue current medications.   BMP and liver enzymes were drawn recently and are stable   3. Chest pain - stable , not a current issue. 4. Nonsustained ventricular tachycardia 5. Mild to moderate coronary artery disease by cath in 2009. 6.  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 Keturah Barre   Current medicines are reviewed at length with the patient today.  The patient does not have concerns regarding medicines.  The following changes have been made:  no change  Labs/ tests ordered today include:  No orders of the defined types were placed in this encounter.    Disposition:   FU with me in 6 months     Signed, Mertie Moores, MD  07/17/2016 11:44 AM    [facility] [address], [place], [place]  [address] Phone: [phone]; Fax: [phone]

## 2016-07-18 LAB — BASIC METABOLIC PANEL
BUN / CREAT RATIO: 19 (ref 12–28)
BUN: 13 mg/dL (ref 8–27)
CO2: 23 mmol/L (ref 18–29)
CREATININE: 0.7 mg/dL (ref 0.57–1.00)
Calcium: 9.5 mg/dL (ref 8.7–10.3)
Chloride: 103 mmol/L (ref 96–106)
GFR calc Af Amer: 102 mL/min/{1.73_m2} (ref >59)
GFR, EST NON AFRICAN AMERICAN: 89 mL/min/{1.73_m2} (ref >59)
GLUCOSE: 103 mg/dL — AB (ref 65–99)
POTASSIUM: 4.5 mmol/L (ref 3.5–5.2)
SODIUM: 141 mmol/L (ref 134–144)

## 2016-07-18 LAB — TSH: TSH: 0.796 u[IU]/mL (ref 0.450–4.500)

## 2016-07-21 DIAGNOSIS — H35351 Cystoid macular degeneration, right eye: Secondary | ICD-10-CM | POA: Diagnosis not present

## 2016-07-21 DIAGNOSIS — H2013 Chronic iridocyclitis, bilateral: Secondary | ICD-10-CM | POA: Diagnosis not present

## 2016-07-21 DIAGNOSIS — H43812 Vitreous degeneration, left eye: Secondary | ICD-10-CM | POA: Diagnosis not present

## 2016-07-28 DIAGNOSIS — H401122 Primary open-angle glaucoma, left eye, moderate stage: Secondary | ICD-10-CM | POA: Diagnosis not present

## 2016-07-28 DIAGNOSIS — H401113 Primary open-angle glaucoma, right eye, severe stage: Secondary | ICD-10-CM | POA: Diagnosis not present

## 2016-07-30 DIAGNOSIS — Z01419 Encounter for gynecological examination (general) (routine) without abnormal findings: Secondary | ICD-10-CM | POA: Diagnosis not present

## 2016-07-30 DIAGNOSIS — N898 Other specified noninflammatory disorders of vagina: Secondary | ICD-10-CM | POA: Diagnosis not present

## 2016-07-31 ENCOUNTER — Other Ambulatory Visit: Payer: Self-pay

## 2016-07-31 ENCOUNTER — Ambulatory Visit (HOSPITAL_COMMUNITY): Payer: Medicare Other | Attending: Cardiology

## 2016-07-31 VITALS — BP 167/81 | HR 53

## 2016-07-31 DIAGNOSIS — I517 Cardiomegaly: Secondary | ICD-10-CM | POA: Insufficient documentation

## 2016-07-31 DIAGNOSIS — I5032 Chronic diastolic (congestive) heart failure: Secondary | ICD-10-CM | POA: Diagnosis not present

## 2016-07-31 DIAGNOSIS — M7989 Other specified soft tissue disorders: Secondary | ICD-10-CM | POA: Insufficient documentation

## 2016-07-31 DIAGNOSIS — R002 Palpitations: Secondary | ICD-10-CM | POA: Insufficient documentation

## 2016-07-31 MED ORDER — PERFLUTREN LIPID MICROSPHERE
1.0000 mL | INTRAVENOUS | Status: AC | PRN
  Administered 2016-07-31: 1 mL via INTRAVENOUS

## 2016-08-04 ENCOUNTER — Telehealth: Payer: Self-pay | Admitting: Nurse Practitioner

## 2016-08-04 ENCOUNTER — Ambulatory Visit (HOSPITAL_COMMUNITY)
Admission: RE | Admit: 2016-08-04 | Discharge: 2016-08-04 | Disposition: A | Discharge status: Home / Self Care | Payer: Medicare Other | Source: Ambulatory Visit | Attending: Cardiology | Admitting: Cardiology

## 2016-08-04 DIAGNOSIS — I5032 Chronic diastolic (congestive) heart failure: Secondary | ICD-10-CM | POA: Insufficient documentation

## 2016-08-04 DIAGNOSIS — M7989 Other specified soft tissue disorders: Secondary | ICD-10-CM | POA: Insufficient documentation

## 2016-08-04 DIAGNOSIS — R0602 Shortness of breath: Secondary | ICD-10-CM

## 2016-08-04 NOTE — Telephone Encounter (Signed)
Patient aware of echo results. When she picked up the phone she sounded breathless so I asked her how she is doing. She states she has not been feeling well. She states she has a cold and is coughing up mucous but prior to that she has been having difficulty walking up just 5-7 steps around her house. She states she gives out of breath doing simple activities that did not used to be a problem for her. She states she has not been able to exercise. I reviewed her chart and asked her if she had noted these symptoms in the past and she stated "not like this." I advised her that I will forward message to Dr. Acie Fredrickson for review but that if her symptoms worsen prior to our return call, to seek immediate medical attention. She verbalized understanding and agreement and thanked me for the call.

## 2016-08-04 NOTE — Telephone Encounter (Signed)
-----   Message from Thayer Headings, MD sent at 08/01/2016 11:20 AM EDT ----- Normal LV systolic function Grade 1 diastolic dysfunction ( which is not unusual )

## 2016-08-06 NOTE — Telephone Encounter (Signed)
Her last myoview was in 2014 ( normal at that time )  Lets get a [employer]. Echo looked good. - normal LV systolic function with mild diastolic dysfunction

## 2016-08-06 NOTE — Telephone Encounter (Signed)
Called patient about Dr. Elmarie Shiley recommendations. Ordered patient a lexican and will have scheduling call patient with an appointment. Informed patient of instructions for lexiscan. Patient verbalized understanding.

## 2016-08-07 ENCOUNTER — Telehealth: Payer: Self-pay | Admitting: Internal Medicine

## 2016-08-07 MED ORDER — PREDNISONE 10 MG PO TABS
ORAL_TABLET | ORAL | 0 refills | Status: DC
Start: 2016-08-07 — End: 2016-08-18

## 2016-08-07 MED ORDER — AZITHROMYCIN 250 MG PO TABS: 250.0000 mg | ORAL_TABLET | ORAL | 0 refills | Status: DC

## 2016-08-07 NOTE — Telephone Encounter (Signed)
Pt aware of rec's per Dr Melvyn Novas Requests that Rxs be sent to [facility] - these have been sent.  Nothing further needed.

## 2016-08-07 NOTE — Telephone Encounter (Signed)
Spoke with pt, who reports of prod cough with yellow mucus, wheezing, chest tightness & headache x2wk Denies fever, chills or sweats Pt taken mucinex DM & singulair with no improvement.  Pt took left over tussionex last night with mild relief.  MW please advise, as CY is on vacation. Thanks.   06/09/16 Neb neo nasal  Dx seasonal allergic rhinitis  Depo 80  Finish the prednisone pills  Sample Breo 100 inhaler    Inhale 1 puff then rinse  Mouth, once daily  Stay well hydrated

## 2016-08-07 NOTE — Telephone Encounter (Signed)
zpak Prednisone 10 mg take  4 each am x 2 days,   2 each am x 2 days,  1 each am x 2 days and stop Ov if not all better

## 2016-08-12 ENCOUNTER — Telehealth (HOSPITAL_COMMUNITY): Payer: Self-pay | Admitting: *Deleted

## 2016-08-12 NOTE — Telephone Encounter (Signed)
Attempted to call patient regarding upcoming appointment- no answer.  Sheila Reynolds  

## 2016-08-14 ENCOUNTER — Telehealth (HOSPITAL_COMMUNITY): Payer: Self-pay | Admitting: *Deleted

## 2016-08-14 NOTE — Telephone Encounter (Signed)
Left message on voicemail in reference to upcoming appointment scheduled for 08/18/16. Phone number given for a call back so details instructions can be given. Sayre Witherington, Ranae Palms

## 2016-08-15 ENCOUNTER — Telehealth: Payer: Self-pay | Admitting: Internal Medicine

## 2016-08-15 NOTE — Telephone Encounter (Signed)
Patient returning call - she can be reached at [phone] -pr

## 2016-08-15 NOTE — Telephone Encounter (Signed)
Sounds as if bacterial infection gone, so won't give more antibiotic.  For the other symptoms, suggest prednisone 20 mg, # 5, 1 daily                                           Broad symptom otc combination like Tylenol Cold and Sinu, or similar  Heating pad for sore back.   If no better first of week please let us know.

## 2016-08-15 NOTE — Telephone Encounter (Signed)
Pt was prescribed Zpak and prednisone taper on 08/07/16 by MW. Pt finished abx and prednisone on 08/13/16 with mild improvement. Pt reports of prod cough with clear mucus, wheezing, chest tightness and right sided back discomfort X39mo Denies fever, chills or sweats. Pt taken Robitussin q4h-q6h & Singulair daily with mild improvement.  CY please advise. Thanks.  Current Outpatient Prescriptions on File Prior to Visit  Medication Sig Dispense Refill  . aspirin EC 81 MG tablet Take 81 mg by mouth daily.    [name] atorvastatin (LIPITOR) 20 MG tablet Take 1 tablet (20 mg total) by mouth daily. 30 tablet 11  . azithromycin (ZITHROMAX) 250 MG tablet Take 1 tablet (250 mg total) by mouth as directed. 6 tablet 0  . brimonidine (ALPHAGAN P) 0.1 % SOLN Place 1 drop into the left eye 2 (two) times daily. Reported on 02/15/2015    . brinzolamide (AZOPT) 1 % ophthalmic suspension Place 1 drop into the left eye 3 (three) times daily.     [name] esomeprazole (NEXIUM) 40 MG capsule Take 40 mg by mouth 2 (two) times daily as needed (reflux).     . furosemide (LASIX) 20 MG tablet Take 1 tablet (20 mg total) by mouth every 3 (three) days. 30 tablet 5  . JANUMET 50-500 MG per tablet Take 1 tablet by mouth Twice daily.    [name] loteprednol (LOTEMAX) 0.5 % ophthalmic suspension Place 1 drop into the left eye 2 (two) times daily as needed (inflammation).     . metoprolol (LOPRESSOR) 50 MG tablet Take 1 tablet (50 mg total) by mouth 2 (two) times daily. May start with 25 mg twice daily for first few weeks 180 tablet 3  . montelukast (SINGULAIR) 10 MG tablet Take 10 mg by mouth at bedtime.    [name] olmesartan (BENICAR) 20 MG tablet Take 20 mg by mouth daily.    . predniSONE (DELTASONE) 10 MG tablet Take 40mg  x 2 days, then 20mg  x 2 days, then 10mg  x 2 days then stop. 14 tablet 0  . pregabalin (LYRICA) 75 MG capsule Take 75 mg by mouth at bedtime.    [name] PROAIR HFA 108 (90 Base) MCG/ACT inhaler Inhale 2 puffs into the lungs every 6 (six) hours  as needed for wheezing. 1 Inhaler 12  . Travoprost (TRAVATAN OP) Place 1 drop into both eyes at bedtime.     . TURMERIC PO Take by mouth.     No current facility-administered medications on file prior to visit.     Allergies  Allergen Reactions  . Crestor [Rosuvastatin Calcium] Other (See Comments)    muscle aches  . Shellfish Allergy Itching and Other (See Comments)    Crab, shrimp and lobster ---lips itch and tingle  . Demerol [Meperidine] Other (See Comments)    Hallucinations

## 2016-08-15 NOTE — Telephone Encounter (Signed)
Spoke with patient. She was not happy with CY's recs. She stated she feels as if he is not understanding how severe her cough is. Refused prednisone because she just finished the round MW called in for her last week.   Was able to find an spot on CY's schedule for Monday 7/9 at 9am. Pt took appt. Nothing else needed at time of call.

## 2016-08-15 NOTE — Telephone Encounter (Signed)
lmtcb x1 for pt. 

## 2016-08-18 ENCOUNTER — Telehealth: Payer: Self-pay | Admitting: Internal Medicine

## 2016-08-18 ENCOUNTER — Other Ambulatory Visit (INDEPENDENT_AMBULATORY_CARE_PROVIDER_SITE_OTHER): Payer: Medicare Other

## 2016-08-18 ENCOUNTER — Encounter: Payer: Self-pay | Admitting: Internal Medicine

## 2016-08-18 ENCOUNTER — Ambulatory Visit (INDEPENDENT_AMBULATORY_CARE_PROVIDER_SITE_OTHER): Payer: Medicare Other | Admitting: Internal Medicine

## 2016-08-18 ENCOUNTER — Encounter (HOSPITAL_COMMUNITY): Payer: Medicare Other

## 2016-08-18 ENCOUNTER — Ambulatory Visit (INDEPENDENT_AMBULATORY_CARE_PROVIDER_SITE_OTHER)
Admission: RE | Admit: 2016-08-18 | Discharge: 2016-08-18 | Disposition: A | Discharge status: Home / Self Care | Payer: Medicare Other | Source: Ambulatory Visit | Attending: Internal Medicine | Admitting: Internal Medicine

## 2016-08-18 VITALS — BP 110/72 | HR 76 | Temp 96.9°F | Ht 62.0 in | Wt 160.6 lb

## 2016-08-18 DIAGNOSIS — D869 Sarcoidosis, unspecified: Secondary | ICD-10-CM

## 2016-08-18 DIAGNOSIS — R05 Cough: Secondary | ICD-10-CM

## 2016-08-18 DIAGNOSIS — G4733 Obstructive sleep apnea (adult) (pediatric): Secondary | ICD-10-CM | POA: Diagnosis not present

## 2016-08-18 DIAGNOSIS — J418 Mixed simple and mucopurulent chronic bronchitis: Secondary | ICD-10-CM

## 2016-08-18 DIAGNOSIS — R059 Cough, unspecified: Secondary | ICD-10-CM

## 2016-08-18 LAB — BASIC METABOLIC PANEL
BUN: 15 mg/dL (ref 6–23)
CHLORIDE: 102 meq/L (ref 96–112)
CO2: 31 meq/L (ref 19–32)
CREATININE: 0.76 mg/dL (ref 0.40–1.20)
Calcium: 9.8 mg/dL (ref 8.4–10.5)
GFR: 97.01 mL/min (ref >60.00)
GLUCOSE: 118 mg/dL — AB (ref 70–99)
Potassium: 3.9 mEq/L (ref 3.5–5.1)
Sodium: 139 mEq/L (ref 135–145)

## 2016-08-18 LAB — CBC WITH DIFFERENTIAL/PLATELET
BASOS ABS: 0.1 10*3/uL (ref 0.0–0.1)
Basophils Relative: 0.6 % (ref 0.0–3.0)
EOS PCT: 3 % (ref 0.0–5.0)
Eosinophils Absolute: 0.3 10*3/uL (ref 0.0–0.7)
HCT: 35.8 % — ABNORMAL LOW (ref 36.0–46.0)
HEMOGLOBIN: 12 g/dL (ref 12.0–15.0)
Lymphocytes Relative: 23 % (ref 12.0–46.0)
Lymphs Abs: 2.2 10*3/uL (ref 0.7–4.0)
MCHC: 33.6 g/dL (ref 30.0–36.0)
MCV: 84.6 fl (ref 78.0–100.0)
MONOS PCT: 7.8 % (ref 3.0–12.0)
Monocytes Absolute: 0.7 10*3/uL (ref 0.1–1.0)
NEUTROS PCT: 65.6 % (ref 43.0–77.0)
Neutro Abs: 6.2 10*3/uL (ref 1.4–7.7)
Platelets: 223 10*3/uL (ref 150.0–400.0)
RBC: 4.24 Mil/uL (ref 3.87–5.11)
RDW: 15.6 % — ABNORMAL HIGH (ref 11.5–15.5)
WBC: 9.4 10*3/uL (ref 4.0–10.5)

## 2016-08-18 MED ORDER — PREDNISONE 10 MG PO TABS: ORAL_TABLET | ORAL | 0 refills | Status: DC

## 2016-08-18 MED ORDER — LEVALBUTEROL HCL 0.63 MG/3ML IN NEBU
0.6300 mg | INHALATION_SOLUTION | Freq: Once | RESPIRATORY_TRACT | Status: AC
  Administered 2016-08-18: 0.63 mg via RESPIRATORY_TRACT

## 2016-08-18 MED ORDER — HYDROCOD POLST-CPM POLST ER 10-8 MG/5ML PO SUER: 5.0000 mL | Freq: Two times a day (BID) | ORAL | 0 refills | Status: DC | PRN

## 2016-08-18 MED ORDER — METHYLPREDNISOLONE ACETATE 80 MG/ML IJ SUSP
80.0000 mg | Freq: Once | INTRAMUSCULAR | Status: AC
  Administered 2016-08-18: 80 mg via INTRAMUSCULAR

## 2016-08-18 MED ORDER — BENZONATATE 200 MG PO CAPS: 200.0000 mg | ORAL_CAPSULE | Freq: Three times a day (TID) | ORAL | 1 refills | Status: DC | PRN

## 2016-08-18 NOTE — Patient Instructions (Signed)
Script sent for prednisone taper and benzonatate cough perles  Script printed for Tussionex cough syrup  Ok to use the perles and / or Delsym cough syrup between doses of Tussionex  Order- CXR     Dx sarcoid, cough  Order lab- CBC w diff, BMET, ACE level    Order- neb xop 0.63               Depo 80  Continue the Nexium and sleep with head elevated to reduce reflux, which may aggravate cough

## 2016-08-18 NOTE — Telephone Encounter (Signed)
Notes recorded by Elie Confer, CMA on 08/18/2016 at 3:21 PM EDT lmomtcb x 1 ------  Notes recorded by Deneise Lever, MD on 08/18/2016 at 11:05 AM EDT CXR- stable old scarring. Heart is at upper limits of normal size. No active disease.    Pt aware of results and will wait for lab results. Nothing more needed at this time.

## 2016-08-18 NOTE — Progress Notes (Signed)
HPI  F former smoker followed for bronchitis, hx  Occular sarcoid, bronchitis/ nodules, OSA complicated by GERD, glaucoma, DM2 ACE 09/17/15-47-WNL PFT: 01/07/2011-FEV1 1.59/79%, FEV1/FVC 0.73, FEF 25-75% improved to 38% with bronchodilator. TLC 90% DLCO 55% Office Spirometry 06/09/16- moderate restriction of exhaled volume. FVC 1.51/68%, FEV1 1.20/70%, ratio 0.79, FEF 25-75% 1.19/73% ------------------------------------------------------------------------------------------------------------------------  06/09/16- 71 yo female former smoker followed for bronchitis/bronchiolitis/lung nodules, history of ocular sarcoid, OSA, complicated by GERD, glaucoma, DM 2 FOLLOWS FOR: Pt continues to have cough-hard to get rid off-feels slightly better but not much after abx's.Increased SOB as well. Last ACE level 09/17/15- 47(-normal). We sent prednisone 20 mg daily 5 days, on 4/26. Had gotten Augmentin from PCP on 4/16. Complaining of productive cough, yellow sputum, wheezing, chest tightness, headache, nasal drainage. Hasn't felt well for the last 3 weeks. Began with sore throat and fever and continues with dry nose and throat, hoarseness, vague malaise. Denies fever, pain, adenopathy, rash. Office Spirometry 06/09/16- moderate restriction of exhaled volume. FVC 1.51/68%, FEV1 1.20/70%, ratio 0.79, FEF 25-75% 1.19/73%  08/18/16-  71 yo female former smoker followed for bronchitis/bronchiolitis/lung nodules, history of ocular sarcoid, OSA, complicated by GERD, glaucoma, DM 2, CAD ACUTE VISIT: Pt states her cough is getting worse-productive at times-white in color at times. Pt is also having SOB and wheezing with this cough-continues to get worse. Pt has not found anything that has given her relief-cough syrup, inhalers, etc. Pt did have fever at the start of her cough about 1 month ago.  Had prednisone and antibiotic 2 weeks ago. Complains of diffuse burning chest and back pains, shortness of breath, cough until  retches. Continues Nexium CPAP is okay, used every night by her report. No download available today.  ROS-see HPI     + = pos Constitutional:   No-   weight loss, night sweats, fevers, chills, fatigue, lassitude. HEENT:   +  headaches, difficulty swallowing, tooth/dental problems, sore throat,       Some  sneezing, itching, ear ache, +nasal congestion, post nasal drip,  CV:  chest pain, no-orthopnea, PND, swelling in lower extremities, anasarca, dizziness, palpitations Resp: +  shortness of breath with exertion not at rest.            productive cough,  + non-productive cough,  No- coughing up of blood.               in color of mucus.  + wheezing.   Skin: No-   rash or lesions. GI:  +  heartburn, indigestion, abdominal pain, nausea, vomiting,  GU:  MS:  +  joint pain or swelling, + back pain Neuro-     nothing unusual Psych:  No- change in mood or affect. No depression or anxiety.  No memory loss.  OBJ   General- Alert, Oriented, Affect anxious, Distress- none acute Skin- rash-none, lesions- none, excoriation- none Lymphadenopathy- none Head- atraumatic            Eyes- Gross vision intact, PERRLA, conjunctivae clear secretions- not injected            Ears- Hearing aid Left            Nose- + turbinate edema, no-Septal dev, mucus, polyps, erosion, perforation             Throat- Mallampati III , mucosa clear , drainage- none, tonsils- atrophic Neck- flexible , trachea midline, no stridor , thyroid nl, carotid no bruit Chest - symmetrical excursion , unlabored  Heart/CV- RRR , no murmur , no gallop  , no rub, nl s1 s2                           - JVD- none , edema- none, stasis changes- none, varices- none           Lung-  cough + dry , wheeze-none, rhonchi-none , dullness-none, rub- none           Chest wall- s Abd- No HSM Br/ Gen/ Rectal- Not done, not indicated Extrem- cyanosis- none, clubbing, none, atrophy- none, strength- nl.  Neuro- grossly intact to  observation

## 2016-08-21 ENCOUNTER — Telehealth (HOSPITAL_COMMUNITY): Payer: Self-pay | Admitting: *Deleted

## 2016-08-21 LAB — ANGIOTENSIN CONVERTING ENZYME: Angiotensin-Converting Enzyme: 30 U/L (ref 9–67)

## 2016-08-21 NOTE — Progress Notes (Signed)
TTC, NA, WCBL.

## 2016-08-21 NOTE — Telephone Encounter (Signed)
Attempted to call patient regarding upcoming appointment x 2 - no answer.  Sheila Reynolds

## 2016-08-21 NOTE — Progress Notes (Signed)
TTC mobile #, NA, VM full. TTC home #, NA and no machine avail. WCB later.

## 2016-08-24 NOTE — Assessment & Plan Note (Signed)
I don't expect significant exacerbation at this age but we will recheck. Plan-CXR, ACE level, CBC with differential

## 2016-08-24 NOTE — Assessment & Plan Note (Signed)
Sustained mild exacerbation. Plan-nebulizer treatments Xopenex, Depo-Medrol, CXR, prednisone 8 day taper, Tessalon Perles, Delsym cough syrup.

## 2016-08-24 NOTE — Assessment & Plan Note (Signed)
She reports good compliance and control without download available at this visit. Compliance goals discussed.

## 2016-08-25 ENCOUNTER — Encounter (HOSPITAL_COMMUNITY): Payer: Medicare Other

## 2016-09-02 ENCOUNTER — Encounter (HOSPITAL_COMMUNITY): Payer: Medicare Other

## 2016-09-02 ENCOUNTER — Telehealth (HOSPITAL_COMMUNITY): Payer: Self-pay | Admitting: *Deleted

## 2016-09-02 NOTE — Telephone Encounter (Signed)
Left message on voicemail in reference to upcoming appointment scheduled for 09/03/16 Phone number given for a call back so details instructions can be given.  Sheila Reynolds

## 2016-09-04 ENCOUNTER — Ambulatory Visit (HOSPITAL_COMMUNITY): Payer: Medicare Other | Attending: Cardiovascular Disease

## 2016-09-04 DIAGNOSIS — R079 Chest pain, unspecified: Secondary | ICD-10-CM | POA: Diagnosis not present

## 2016-09-04 DIAGNOSIS — I2510 Atherosclerotic heart disease of native coronary artery without angina pectoris: Secondary | ICD-10-CM | POA: Diagnosis not present

## 2016-09-04 DIAGNOSIS — I10 Essential (primary) hypertension: Secondary | ICD-10-CM | POA: Insufficient documentation

## 2016-09-04 DIAGNOSIS — R0609 Other forms of dyspnea: Secondary | ICD-10-CM | POA: Diagnosis not present

## 2016-09-04 DIAGNOSIS — R0602 Shortness of breath: Secondary | ICD-10-CM

## 2016-09-04 DIAGNOSIS — R002 Palpitations: Secondary | ICD-10-CM | POA: Insufficient documentation

## 2016-09-04 LAB — MYOCARDIAL PERFUSION IMAGING
CHL CUP NUCLEAR SDS: 4
CHL CUP NUCLEAR SRS: 0
CHL CUP RESTING HR STRESS: 60 {beats}/min
LHR: 0.25
LV dias vol: 67 mL (ref 46–106)
LV sys vol: 23 mL
NUC STRESS TID: 1
Peak HR: 106 {beats}/min
SSS: 4

## 2016-09-04 MED ORDER — TECHNETIUM TC 99M TETROFOSMIN IV KIT
33.0000 | PACK | Freq: Once | INTRAVENOUS | Status: AC | PRN
Start: 2016-09-04 — End: 2016-09-04
  Administered 2016-09-04: 33 via INTRAVENOUS
  Filled 2016-09-04: qty 33

## 2016-09-04 MED ORDER — TECHNETIUM TC 99M TETROFOSMIN IV KIT
10.8000 | PACK | Freq: Once | INTRAVENOUS | Status: AC | PRN
  Administered 2016-09-04: 10.8 via INTRAVENOUS
  Filled 2016-09-04: qty 11

## 2016-09-04 MED ORDER — REGADENOSON 0.4 MG/5ML IV SOLN
0.4000 mg | Freq: Once | INTRAVENOUS | Status: AC
  Administered 2016-09-04: 0.4 mg via INTRAVENOUS

## 2016-09-17 DIAGNOSIS — H43812 Vitreous degeneration, left eye: Secondary | ICD-10-CM | POA: Diagnosis not present

## 2016-09-17 DIAGNOSIS — H35351 Cystoid macular degeneration, right eye: Secondary | ICD-10-CM | POA: Diagnosis not present

## 2016-09-17 DIAGNOSIS — H20013 Primary iridocyclitis, bilateral: Secondary | ICD-10-CM | POA: Diagnosis not present

## 2016-09-17 DIAGNOSIS — H5319 Other subjective visual disturbances: Secondary | ICD-10-CM | POA: Diagnosis not present

## 2016-09-25 DIAGNOSIS — E1165 Type 2 diabetes mellitus with hyperglycemia: Secondary | ICD-10-CM | POA: Diagnosis not present

## 2016-09-25 DIAGNOSIS — I119 Hypertensive heart disease without heart failure: Secondary | ICD-10-CM | POA: Diagnosis not present

## 2016-09-25 DIAGNOSIS — Z6830 Body mass index (BMI) 30.0-30.9, adult: Secondary | ICD-10-CM | POA: Diagnosis not present

## 2016-09-25 DIAGNOSIS — I2510 Atherosclerotic heart disease of native coronary artery without angina pectoris: Secondary | ICD-10-CM | POA: Diagnosis not present

## 2016-10-20 DIAGNOSIS — H20013 Primary iridocyclitis, bilateral: Secondary | ICD-10-CM | POA: Diagnosis not present

## 2016-10-20 DIAGNOSIS — H35351 Cystoid macular degeneration, right eye: Secondary | ICD-10-CM | POA: Diagnosis not present

## 2016-11-18 ENCOUNTER — Encounter: Payer: Self-pay | Admitting: Internal Medicine

## 2016-11-18 ENCOUNTER — Ambulatory Visit (INDEPENDENT_AMBULATORY_CARE_PROVIDER_SITE_OTHER): Payer: Medicare Other | Admitting: Internal Medicine

## 2016-11-18 DIAGNOSIS — Z23 Encounter for immunization: Secondary | ICD-10-CM

## 2016-11-18 DIAGNOSIS — J410 Simple chronic bronchitis: Secondary | ICD-10-CM

## 2016-11-18 DIAGNOSIS — D869 Sarcoidosis, unspecified: Secondary | ICD-10-CM

## 2016-11-18 DIAGNOSIS — K219 Gastro-esophageal reflux disease without esophagitis: Secondary | ICD-10-CM | POA: Diagnosis not present

## 2016-11-18 MED ORDER — BUDESONIDE-FORMOTEROL FUMARATE 160-4.5 MCG/ACT IN AERO: 2.0000 | INHALATION_SPRAY | Freq: Two times a day (BID) | RESPIRATORY_TRACT | 0 refills | Status: DC

## 2016-11-18 NOTE — Assessment & Plan Note (Signed)
Persistent cough. Nonpurulent. Plan-pending CT chest. Update PFT. Sample trial Symbicort. Flu shot

## 2016-11-18 NOTE — Assessment & Plan Note (Signed)
This may be contributing to her substernal discomfort. We discussed reflux precautions and she is going to use Nexium twice daily before meals to see if that reduces cough and chest discomfort.

## 2016-11-18 NOTE — Assessment & Plan Note (Addendum)
Diagnosis always presumptive with no biopsy. ACE never elevated. 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 CXR stable basilar scarring. I don't think this is an active problem, but with her substernal discomfort and cough we are going to update CT chest.

## 2016-11-18 NOTE — Progress Notes (Signed)
HPI  F former smoker followed for bronchitis, hx  Occular sarcoid, bronchitis/ nodules, OSA complicated by GERD, glaucoma, DM2 ACE 09/17/15-47-WNL PFT: 01/07/2011-FEV1 1.59/79%, FEV1/FVC 0.73, FEF 25-75% improved to 38% with bronchodilator. TLC 90% DLCO 55% Office Spirometry 06/09/16- moderate restriction of exhaled volume. FVC 1.51/68%, FEV1 1.20/70%, ratio 0.79, FEF 25-75% 1.19/73% Nuclear Stress Test 09/04/16- EF 65%, low risk. ECHO 07/31/16- Gr 1 DD -----------------------------------------------------------------------------------------------------------------------  08/18/16-  71 yo female former smoker followed for bronchitis/bronchiolitis/lung nodules, history of ocular sarcoid, OSA, complicated by GERD, glaucoma, DM 2, CAD ACUTE VISIT: Pt states her cough is getting worse-productive at times-white in color at times. Pt is also having SOB and wheezing with this cough-continues to get worse. Pt has not found anything that has given her relief-cough syrup, inhalers, etc. Pt did have fever at the start of her cough about 1 month ago.  Had prednisone and antibiotic 2 weeks ago. Complains of diffuse burning chest and back pains, shortness of breath, cough until retches. Continues Nexium CPAP is okay, used every night by her report. No download available today.  11/18/16- 71 yo female former smoker followed for bronchitis/bronchiolitis/lung nodules, history of ocular sarcoid, OSA, complicated by GERD, Glaucoma, DM 2, CAD Sarcoidosis; pt states she is having wheezing, SOB, and cough->productive at times-clear. Denies any fever or chills.  Nuclear Stress Test 09/04/16- EF 65%, low risk. ECHO 07/31/16- Gr 1 DD She complains of cough all summer, productive scant clear mucus. No fever or chills. Some wheeze. Increased dyspnea on exertion on hills and stairs. No acute event. Rescue inhaler helps some, used occasionally. Still feels heartburn or reflux taking Nexium once or twice daily. Substernal and mid  thoracic spine level pains not clearly related to cough, swallowing or exertion. Not clear if these are single pain with radiation or 2 different problems. 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 ACE level 08/18/16-30 CXR 08/18/16- IMPRESSION: Bibasilar scarring.  No active disease.  ROS-see HPI     + = pos Constitutional:   No-   weight loss, night sweats, fevers, chills, fatigue, lassitude. HEENT:   +  headaches, difficulty swallowing, tooth/dental problems, sore throat,       Some  sneezing, itching, ear ache, +nasal congestion, post nasal drip,  CV: + chest pain, no-orthopnea, PND, swelling in lower extremities, anasarca, dizziness, palpitations Resp: +  shortness of breath with exertion not at rest.            productive cough,  + non-productive cough,  No- coughing up of blood.               in color of mucus.  + wheezing.   Skin: No-   rash or lesions. GI:  +  heartburn, indigestion, abdominal pain, nausea, vomiting,  GU:  MS:  +  joint pain or swelling, + back pain Neuro-     nothing unusual Psych:  No- change in mood or affect. No depression or anxiety.  No memory loss.  OBJ   General- Alert, Oriented, Affect anxious, Distress- none acute Skin- rash-none, lesions- none, excoriation- none Lymphadenopathy- none Head- atraumatic            Eyes- Gross vision intact, PERRLA, conjunctivae clear secretions- not injected            Ears- Hearing aid             Nose- + turbinate edema, no-Septal dev, mucus, polyps, erosion, perforation             Throat- Mallampati  III , mucosa clear , drainage- none, tonsils- atrophic Neck- flexible , trachea midline, no stridor , thyroid nl, carotid no bruit Chest - symmetrical excursion , unlabored           Heart/CV- RRR , no murmur , no gallop  , no rub, nl s1 s2                           - JVD- none , edema- none, stasis changes- none, varices- none           Lung-  cough + dry , wheeze-none, rhonchi-none , dullness-none, rub- none            Chest wall- s Abd- No HSM Br/ Gen/ Rectal- Not done, not indicated Extrem- cyanosis- none, clubbing, none, atrophy- none, strength- nl.  Neuro- grossly intact to observation

## 2016-11-18 NOTE — Patient Instructions (Signed)
Flu vax senior  Order- CT chest non contrast   Dx sarcoid, multiple lung nodules  Order- schedule PFT    I suggest you try using nexium twice daily, before meals. See if that significantly helps the reflux and heart burn feeling.  Sample Symbicort 160    Inhale 2 puffs, then rinse mouth, twice daily- maintnenance

## 2016-11-20 DIAGNOSIS — B353 Tinea pedis: Secondary | ICD-10-CM | POA: Diagnosis not present

## 2016-11-20 DIAGNOSIS — M722 Plantar fascial fibromatosis: Secondary | ICD-10-CM | POA: Diagnosis not present

## 2016-11-20 DIAGNOSIS — M79671 Pain in right foot: Secondary | ICD-10-CM | POA: Diagnosis not present

## 2016-11-20 DIAGNOSIS — M79673 Pain in unspecified foot: Secondary | ICD-10-CM | POA: Diagnosis not present

## 2016-11-21 ENCOUNTER — Ambulatory Visit (INDEPENDENT_AMBULATORY_CARE_PROVIDER_SITE_OTHER)
Admission: RE | Admit: 2016-11-21 | Discharge: 2016-11-21 | Disposition: A | Discharge status: Home / Self Care | Payer: Medicare Other | Source: Ambulatory Visit | Attending: Internal Medicine | Admitting: Internal Medicine

## 2016-11-21 DIAGNOSIS — D869 Sarcoidosis, unspecified: Secondary | ICD-10-CM

## 2016-11-21 DIAGNOSIS — R918 Other nonspecific abnormal finding of lung field: Secondary | ICD-10-CM | POA: Diagnosis not present

## 2016-11-25 ENCOUNTER — Ambulatory Visit (INDEPENDENT_AMBULATORY_CARE_PROVIDER_SITE_OTHER): Payer: Medicare Other | Admitting: Internal Medicine

## 2016-11-25 DIAGNOSIS — D869 Sarcoidosis, unspecified: Secondary | ICD-10-CM | POA: Diagnosis not present

## 2016-11-25 LAB — PULMONARY FUNCTION TEST
DL/VA % pred: 88 %
DL/VA: 4.04 ml/min/mmHg/L
DLCO COR % PRED: 47 %
DLCO cor: 10.18 ml/min/mmHg
DLCO unc % pred: 46 %
DLCO unc: 9.95 ml/min/mmHg
FEF 25-75 POST: 1.44 L/s
FEF 25-75 Pre: 1.37 L/sec
FEF2575-%Change-Post: 4 %
FEF2575-%PRED-POST: 90 %
FEF2575-%Pred-Pre: 86 %
FEV1-%CHANGE-POST: 1 %
FEV1-%Pred-Post: 83 %
FEV1-%Pred-Pre: 82 %
FEV1-Post: 1.4 L
FEV1-Pre: 1.39 L
FEV1FVC-%CHANGE-POST: 1 %
FEV1FVC-%Pred-Pre: 104 %
FEV6-%Change-Post: 0 %
FEV6-%PRED-PRE: 82 %
FEV6-%Pred-Post: 81 %
FEV6-Post: 1.69 L
FEV6-Pre: 1.7 L
FEV6FVC-%Pred-Post: 105 %
FEV6FVC-%Pred-Pre: 105 %
FVC-%CHANGE-POST: 0 %
FVC-%PRED-POST: 78 %
FVC-%PRED-PRE: 78 %
FVC-POST: 1.69 L
FVC-PRE: 1.7 L
POST FEV6/FVC RATIO: 100 %
PRE FEV1/FVC RATIO: 81 %
PRE FEV6/FVC RATIO: 100 %
Post FEV1/FVC ratio: 83 %
RV % PRED: 75 %
RV: 1.57 L
TLC % PRED: 69 %
TLC: 3.29 L

## 2016-11-25 NOTE — Patient Instructions (Signed)
PFT done today. 

## 2016-12-01 DIAGNOSIS — H401122 Primary open-angle glaucoma, left eye, moderate stage: Secondary | ICD-10-CM | POA: Diagnosis not present

## 2016-12-01 DIAGNOSIS — H2512 Age-related nuclear cataract, left eye: Secondary | ICD-10-CM | POA: Diagnosis not present

## 2016-12-01 DIAGNOSIS — H401113 Primary open-angle glaucoma, right eye, severe stage: Secondary | ICD-10-CM | POA: Diagnosis not present

## 2016-12-05 ENCOUNTER — Telehealth: Payer: Self-pay | Admitting: Cardiovascular Disease

## 2016-12-05 NOTE — Telephone Encounter (Signed)
Pt is returning call from [place] to schedule OV to set up cardiac cath. Pt is scheduled to come in on 12/10/16 at 8:30am with Truitt Merle.

## 2016-12-05 NOTE — Telephone Encounter (Signed)
New message    Pt is calling asking for a call back. She did not say what it was about.

## 2016-12-10 ENCOUNTER — Encounter: Payer: Self-pay | Admitting: Nurse Practitioner

## 2016-12-10 ENCOUNTER — Ambulatory Visit (INDEPENDENT_AMBULATORY_CARE_PROVIDER_SITE_OTHER): Payer: Medicare Other | Admitting: Nurse Practitioner

## 2016-12-10 VITALS — BP 150/84 | HR 62 | Ht 62.0 in | Wt 165.4 lb

## 2016-12-10 DIAGNOSIS — R9389 Abnormal findings on diagnostic imaging of other specified body structures: Secondary | ICD-10-CM | POA: Diagnosis not present

## 2016-12-10 DIAGNOSIS — R0602 Shortness of breath: Secondary | ICD-10-CM

## 2016-12-10 DIAGNOSIS — I259 Chronic ischemic heart disease, unspecified: Secondary | ICD-10-CM | POA: Diagnosis not present

## 2016-12-10 LAB — CBC
Hematocrit: 34.4 % (ref 34.0–46.6)
Hemoglobin: 11.6 g/dL (ref 11.1–15.9)
MCH: 28.3 pg (ref 26.6–33.0)
MCHC: 33.7 g/dL (ref 31.5–35.7)
MCV: 84 fL (ref 79–97)
Platelets: 217 10*3/uL (ref 150–379)
RBC: 4.1 x10E6/uL (ref 3.77–5.28)
RDW: 14 % (ref 12.3–15.4)
WBC: 7.5 10*3/uL (ref 3.4–10.8)

## 2016-12-10 LAB — BASIC METABOLIC PANEL
BUN/Creatinine Ratio: 13 (ref 12–28)
BUN: 10 mg/dL (ref 8–27)
CO2: 24 mmol/L (ref 20–29)
Calcium: 9.2 mg/dL (ref 8.7–10.3)
Chloride: 101 mmol/L (ref 96–106)
Creatinine, Ser: 0.76 mg/dL (ref 0.57–1.00)
GFR calc Af Amer: 93 mL/min/{1.73_m2} (ref >59)
GFR calc non Af Amer: 80 mL/min/{1.73_m2} (ref >59)
Glucose: 107 mg/dL — ABNORMAL HIGH (ref 65–99)
Potassium: 4.1 mmol/L (ref 3.5–5.2)
Sodium: 141 mmol/L (ref 134–144)

## 2016-12-10 LAB — PT AND PTT
INR: 1.1 (ref 0.8–1.2)
Prothrombin Time: 11.2 s (ref 9.1–12.0)
aPTT: 32 s (ref 24–33)

## 2016-12-10 NOTE — Progress Notes (Signed)
CARDIOLOGY OFFICE NOTE  Date:  12/10/2016    Sheila Reynolds Date of Birth: 23-Apr-1945 Medical Record #[id number]  PCP:  Glendale Chard, MD  Cardiologist:  [name]   Chief Complaint  Patient presents with  . Chest Pain  . Coronary Artery Disease    Pre cath visit - seen for Dr. Acie Fredrickson    History of Present Illness: Sheila Reynolds is a 71 y.o. female who presents today for a pre cath visit. Seen for Dr. Acie Fredrickson.   She has a history of known moderate CAD per remote cath in 2009. She has had chronic chest pain. Other issues include sarcoidosis, NSVT and HLD.   She has had multiple Myoviews over the years - last in July of 2018.   Recent CT of the chest concerning for atherosclerosis. Dr. Acie Fredrickson had recommended cardiac catheterization if she was having issues - patient reported chest tingling and shortness of breath - thus added to my schedule.   Comes in today. Here alone. Lots of fatigue. Says she "is tired and weak all the time". No real exercise. Has had chest pain and shortness of breath. Feels like a pins and needle sensation - goes down the left side but will move to the right side. This has been chronic. She is anxious to proceed on with cardiac catheterization.   Past Medical History:  Diagnosis Date  . Anemia    3 months ago anemic  . Anxiety    on meds  . Bronchitis with emphysema   . Cancer ([place])   . Chest pain   . Coronary artery disease    Mild to moderate CAD  . Diabetes mellitus without complication ([place])   . Dysrhythmia    has fluttering at times  . Family history of anesthesia complication    daughter N/V  . Fibromyalgia   . GERD (gastroesophageal reflux disease)    on meds  . Headache(784.0)   . Heart murmur   . Hx of echocardiogram    Echo (03/2013):  Tech limited; Mild focal basal septal hypertrophy, EF 60-65%, normal RVF  . Hyperlipidemia   . Hypertension   . Nonsustained ventricular tachycardia ([place])   . Palpitations   . PVC  (premature ventricular contraction)    a. Holter 12/16: NSR, occ PAC,PVCs  . Shortness of breath    loses breathe at times and have to take extra breathes  . Sleep apnea    CPAP    Past Surgical History:  Procedure Laterality Date  . BREAST SURGERY  approx 1992   bilateral breast surgery for fibrocystic breast disease  . CARDIAC CATHETERIZATION  05/04/2007   reveals overall normal left ventricular systolic function. Ejection fraction 65-70%  . CATARACT EXTRACTION W/PHACO Right 07/20/2013   Procedure: CATARACT EXTRACTION PHACO AND INTRAOCULAR LENS PLACEMENT (IOC);  Surgeon: Marylynn Pearson, MD;  Location: [facility];  Service: Ophthalmology;  Laterality: Right;  . COLONOSCOPY W/ BIOPSIES AND POLYPECTOMY    . COLONOSCOPY WITH PROPOFOL N/A 09/07/2014   Procedure: COLONOSCOPY WITH PROPOFOL;  Surgeon: Juanita Craver, MD;  Location: [facility];  Service: Endoscopy;  Laterality: N/A;  . ESOPHAGOGASTRODUODENOSCOPY (EGD) WITH PROPOFOL N/A 09/07/2014   Procedure: ESOPHAGOGASTRODUODENOSCOPY (EGD) WITH PROPOFOL;  Surgeon: Juanita Craver, MD;  Location: [facility];  Service: Endoscopy;  Laterality: N/A;  . EXTERNAL EAR SURGERY     tumors removed  . GLAUCOMA SURGERY    . MINI SHUNT INSERTION Right 07/20/2013   Procedure: INSERTION OF GLAUCOMA FILTRATION DEVICE RIGHT EYE;  Surgeon: Marylynn Pearson, MD;  Location: [facility];  Service: Ophthalmology;  Laterality: Right;  . MITOMYCIN C APPLICATION Right 7/42/5956   Procedure: MITOMYCIN C APPLICATION;  Surgeon: Marylynn Pearson, MD;  Location: [facility];  Service: Ophthalmology;  Laterality: Right;  . MITOMYCIN C APPLICATION Right 3/87/5643   Procedure: MITOMYCIN C APPLICATION RIGHT EYE;  Surgeon: Marylynn Pearson, MD;  Location: [place];  Service: Ophthalmology;  Laterality: Right;  . TONSILLECTOMY    . TOTAL ABDOMINAL HYSTERECTOMY  1982  . TRABECULECTOMY Right 02/21/2015   Procedure: TRABECULECTOMY WITH [facility] ON THE RIGHT EYE;  Surgeon: Marylynn Pearson, MD;  Location: [place];  Service:  Ophthalmology;  Laterality: Right;     Medications: Current Meds  Medication Sig  . aspirin EC 81 MG tablet Take 81 mg by mouth daily.  [name] atorvastatin (LIPITOR) 20 MG tablet Take 1 tablet (20 mg total) by mouth daily.  . benzonatate (TESSALON) 200 MG capsule Take 1 capsule (200 mg total) by mouth 3 (three) times daily as needed for cough.  . brimonidine (ALPHAGAN P) 0.1 % SOLN Place 1 drop into the left eye 2 (two) times daily. Reported on 02/15/2015  . brinzolamide (AZOPT) 1 % ophthalmic suspension Place 1 drop into the left eye 3 (three) times daily.   . budesonide-formoterol (SYMBICORT) 160-4.5 MCG/ACT inhaler Inhale 2 puffs into the lungs 2 (two) times daily.  [name] esomeprazole (NEXIUM) 40 MG capsule Take 40 mg by mouth 2 (two) times daily as needed (reflux).   . furosemide (LASIX) 20 MG tablet Take 1 tablet (20 mg total) by mouth every 3 (three) days.  [name] JANUMET 50-500 MG per tablet Take 1 tablet by mouth Twice daily.  [name] loteprednol (LOTEMAX) 0.5 % ophthalmic suspension Place 1 drop into the left eye 2 (two) times daily as needed (inflammation).   . metoprolol (LOPRESSOR) 50 MG tablet Take 1 tablet (50 mg total) by mouth 2 (two) times daily. May start with 25 mg twice daily for first few weeks  . metoprolol tartrate (LOPRESSOR) 50 MG tablet Take 1/2 tablet in the a.m. And 1 tablet by mouth in the afternoon  . montelukast (SINGULAIR) 10 MG tablet Take 10 mg by mouth at bedtime.  [name] olmesartan (BENICAR) 20 MG tablet Take 20 mg by mouth daily.  . pregabalin (LYRICA) 75 MG capsule Take 75 mg by mouth at bedtime.  [name] PROAIR HFA 108 (90 Base) MCG/ACT inhaler Inhale 2 puffs into the lungs every 6 (six) hours as needed for wheezing.  . Travoprost (TRAVATAN OP) Place 1 drop into both eyes at bedtime.   . TURMERIC PO Take by mouth.  . [DISCONTINUED] chlorpheniramine-HYDROcodone (TUSSIONEX PENNKINETIC ER) 10-8 MG/5ML SUER Take 5 mLs by mouth every 12 (twelve) hours as needed for cough.      Allergies: Allergies  Allergen Reactions  . Crestor [Rosuvastatin Calcium] Other (See Comments)    muscle aches  . Shellfish Allergy Itching and Other (See Comments)    Crab, shrimp and lobster ---lips itch and tingle  . Demerol [Meperidine] Other (See Comments)    Hallucinations     Social History: The patient  reports that she quit smoking about 36 years ago. Her smoking use included Cigarettes. She has a 4.00 pack-year smoking history. She has never used smokeless tobacco. She reports that she does not drink alcohol or use drugs.   Family History: The patient's family history includes Cancer in her mother and paternal grandmother; Diabetes in her brother and father; Glaucoma in her brother and  father; Heart disease in her father and paternal grandmother.   Review of Systems: Please see the history of present illness.   Otherwise, the review of systems is positive for none.   All other systems are reviewed and negative.   Physical Exam: VS:  BP (!) 150/84   Pulse 62   Ht 5\' 2"  (1.575 m)   Wt 165 lb 6.4 oz (75 kg)   BMI 30.25 kg/m  .  BMI Body mass index is 30.25 kg/m.  Wt Readings from Last 3 Encounters:  12/10/16 165 lb 6.4 oz (75 kg)  11/18/16 165 lb (74.8 kg)  09/04/16 160 lb (72.6 kg)    General: Pleasant. Little anxious. Alert and in no acute distress.   HEENT: Normal.  Neck: Supple, no JVD, carotid bruits, or masses noted.  Cardiac: Regular rate and rhythm. No murmurs, rubs, or gallops. No edema.  Respiratory:  Lungs are clear to auscultation bilaterally with normal work of breathing.  GI: Soft and nontender.  MS: No deformity or atrophy. Gait and ROM intact.  Skin: Warm and dry. Color is normal.  Neuro:  Strength and sensation are intact and no gross focal deficits noted.  Psych: Alert, appropriate and with normal affect.   LABORATORY DATA:  EKG:  EKG is ordered today. This demonstrates NSR.  Lab Results  Component Value Date   WBC 9.4 08/18/2016    HGB 12.0 08/18/2016   HCT 35.8 (L) 08/18/2016   PLT 223.0 08/18/2016   GLUCOSE 118 (H) 08/18/2016   CHOL 133 05/07/2016   TRIG 80 05/07/2016   HDL 50 05/07/2016   LDLCALC 67 05/07/2016   ALT 23 03/19/2016   AST 27 03/19/2016   NA 139 08/18/2016   K 3.9 08/18/2016   CL 102 08/18/2016   CREATININE 0.76 08/18/2016   BUN 15 08/18/2016   CO2 31 08/18/2016   TSH 0.796 07/17/2016     BNP (last 3 results) No results for input(s): BNP in the last 8760 hours.  ProBNP (last 3 results) No results for input(s): PROBNP in the last 8760 hours.   Other Studies Reviewed Today:  CT CHEST IMPRESSION 11/2016: Stable peribronchovascular nodularity in the posterior right upper lobe, favoring post infectious/inflammatory scarring.  Aortic Atherosclerosis (ICD10-I70.0).   Electronically Signed   By: Julian Hy M.D.   On: 11/21/2016 18:13    ANGIOGRAPHY MARCH 2009:   Left main:  The left main is smooth and normal.   The left anterior descending artery has sequential 40% stenosis in the  proximal vessel.  The second stenosis is just prior to the bifurcation  of the first diagonal.  The mid and distal LAD had only minor luminal  irregularities.   The first diagonal has a 30-40% proximal stenosis.  There is  calcification in this proximal vessel.   The left circumflex artery is large and is codominant.  The obtuse  marginal artery has minor luminal irregularities.  The distal circumflex  is essentially normal.   The posterior descending artery is relatively small but has minor  luminal irregularities.   The right coronary artery is small and is codominant.  It supplies a  very small posterior descending artery which is unremarkable.   Left ventriculogram was performed in the 30-degree RAO position.  It  reveals overall normal left ventricular systolic function.  Ejection  fraction 65-70%.   COMPLICATIONS:  None.   CONCLUSION:  Moderate coronary artery  disease primarily involving the  left anterior descending.  At this  point, I do not think that this LAD  is ischemic, although we will need to do a Cardiolite study to verify  this for sure.  We will continue with medical therapy for the time  being.           ______________________________  Thayer Headings, M.D.  Notes recorded by Emmaline Life, RN on 12/02/2016 at 9:50 AM EDT Reviewed results with patient. Patient c/o SOB and chest tingling. Per Dr. Acie Fredrickson, patient should be scheduled for ov and scheduled for cardiac cath. Patient states she would like to think about this and talk to her husband and will call back. She thanked me for the call. ------  Notes recorded by [name], Wonda Cheng, MD on 11/30/2016 at 6:50 PM EDT We have done numerous myoview studies over the years - all have been stable Will continue to follow clinically  Cath 05/04/07 showed moderate CAD.  She has not had concerning symptoms recently . Would have a low threshold to repeat cath if she has symptoms suggestive of CAD   Assessment/Plan:  1. Known CAD - multiple complaints - cardiac catheterization has been recommended. The patient understands that risks include but are not limited to stroke (1 in 1000), death (1 in 41), kidney failure [usually temporary] (1 in 500), bleeding (1 in 200), allergic reaction [possibly serious] (1 in 200), and agrees to proceed. Scheduled for Dr. Saunders Revel this Friday.   2. HLD - on statin therapy  3. Sarcoidosis  4. DM - holding Janumet as of today  5. HTN - will need to follow. I think BP is elevated today due to anxiety.   Current medicines are reviewed with the patient today.  The patient does not have concerns regarding medicines other than what has been noted above.  The following changes have been made:  See above.  Labs/ tests ordered today include:    Orders Placed This Encounter  Procedures  . Basic metabolic panel  . CBC  . PT and PTT  . EKG 12-Lead      Disposition:   Further disposition to follow.    Patient is agreeable to this plan and will call if any problems develop in the interim.   SignedTruitt Merle, NP  12/10/2016 9:28 AM  [place] [address] [place], [place]  [address] Phone: [phone] Fax: [phone]

## 2016-12-10 NOTE — Patient Instructions (Addendum)
We will be checking the following labs today - BMET, CBC, PT and PTT   Medication Instructions:    Continue with your current medicines.     Testing/Procedures To Be Arranged:  Cardiac catheterization     Other Special Instructions:   Your provider has recommended a cardiac catherization  You are scheduled for a cardiac catheterization on Friday, November 2nd at 1:30 pm  with Dr. END or associate.  Please arrive at the [facility] (Main Entrance) at [facility] at [place] Stay on Friday, November 2nd at 11:30 AM.    Special note: Every effort is made to have your procedure done on time.   Please understand that emergencies sometimes delay a scheduled   procedure.  No food or drink after midnight on Thursday.   You may take your morning medications with a sip of water on the day of your procedure.  Please take a baby aspirin (81 mg) on the morning of your procedure.   Medications to [place] as of today  Plan for a one night stay -- bring personal belongings.  Bring a current list of your medications and current insurance cards.  You MUST have a responsible person to drive you home. Someone MUST be with you the first 24 hours after you arrive home or your discharge will be delayed. Wear clothes that are easy to get on and off and wear slip on shoes.    Coronary Angiogram A coronary angiogram, also called coronary angiography, is an X-ray procedure used to look at the arteries in the heart. In this procedure, a dye (contrast dye) is injected through a long, hollow tube (catheter). The catheter is about the size of a piece of cooked spaghetti and is inserted through your groin, wrist, or arm. The dye is injected into each artery, and X-rays are then taken to show if there is a blockage in the arteries of your heart.  LET [facility] CARE PROVIDER KNOW ABOUT: Any allergies you have, including allergies to shellfish or  contrast dye.  All medicines you are taking, including vitamins, herbs, eye drops, creams, and over-the-counter medicines.  Previous problems you or members of your family have had with the use of anesthetics.  Any blood disorders you have.  Previous surgeries you have had. History of kidney problems or failure.  Other medical conditions you have.  RISKS AND COMPLICATIONS  Generally, a coronary angiogram is a safe procedure. However, about 1 person out of 1000 can have problems that may include: Allergic reaction to the dye. Bleeding/bruising from the access site or other locations. Kidney injury, especially in people with impaired kidney function. Stroke (rare). Heart attack (rare). Irregular rhythms (rare) Death (rare)  BEFORE THE PROCEDURE  Do not eat or drink anything after midnight the night before the procedure or as directed by your health care provider.  Ask your health care provider about changing or stopping your regular medicines. This is especially important if you are taking diabetes medicines or blood thinners.  PROCEDURE You may be given a medicine to help you relax (sedative) before the procedure. This medicine is given through an intravenous (IV) access tube that is inserted into one of your veins.  The area where the catheter will be inserted will be washed and shaved. This is usually done in the groin but may be done in the fold of your arm (near your elbow) or in the wrist.  A  medicine will be given to numb the area where the catheter will be inserted (local anesthetic).  The health care provider will insert the catheter into an artery. The catheter will be guided by using a special type of X-ray (fluoroscopy) of the blood vessel being examined.  A special dye will then be injected into the catheter, and X-rays will be taken. The dye will help to show where any narrowing or blockages are located in the heart arteries.    AFTER THE PROCEDURE  If the  procedure is done through the leg, you will be kept in bed lying flat for several hours. You will be instructed to not bend or cross your legs. The insertion site will be checked frequently.  The pulse in your feet or wrist will be checked frequently.  Additional blood tests, X-rays, and an electrocardiogram may be done.      If you need a refill on your cardiac medications before your next appointment, please call your pharmacy.   Call the [place] office at [phone] if you have any questions, problems or concerns.

## 2016-12-12 ENCOUNTER — Ambulatory Visit (HOSPITAL_COMMUNITY)
Admission: RE | Admit: 2016-12-12 | Discharge: 2016-12-12 | Disposition: A | Discharge status: Home / Self Care | Payer: Medicare Other | Source: Ambulatory Visit | Attending: Internal Medicine | Admitting: Internal Medicine

## 2016-12-12 ENCOUNTER — Encounter (HOSPITAL_COMMUNITY)
Admission: RE | Disposition: A | Discharge status: Home / Self Care | Payer: Self-pay | Source: Ambulatory Visit | Attending: Internal Medicine

## 2016-12-12 DIAGNOSIS — D869 Sarcoidosis, unspecified: Secondary | ICD-10-CM | POA: Diagnosis not present

## 2016-12-12 DIAGNOSIS — R0789 Other chest pain: Secondary | ICD-10-CM | POA: Diagnosis not present

## 2016-12-12 DIAGNOSIS — J439 Emphysema, unspecified: Secondary | ICD-10-CM | POA: Insufficient documentation

## 2016-12-12 DIAGNOSIS — R0602 Shortness of breath: Secondary | ICD-10-CM | POA: Diagnosis present

## 2016-12-12 DIAGNOSIS — K219 Gastro-esophageal reflux disease without esophagitis: Secondary | ICD-10-CM | POA: Diagnosis not present

## 2016-12-12 DIAGNOSIS — M797 Fibromyalgia: Secondary | ICD-10-CM | POA: Insufficient documentation

## 2016-12-12 DIAGNOSIS — I2510 Atherosclerotic heart disease of native coronary artery without angina pectoris: Secondary | ICD-10-CM | POA: Diagnosis not present

## 2016-12-12 DIAGNOSIS — Z7951 Long term (current) use of inhaled steroids: Secondary | ICD-10-CM | POA: Insufficient documentation

## 2016-12-12 DIAGNOSIS — G8929 Other chronic pain: Secondary | ICD-10-CM | POA: Insufficient documentation

## 2016-12-12 DIAGNOSIS — R9389 Abnormal findings on diagnostic imaging of other specified body structures: Secondary | ICD-10-CM

## 2016-12-12 DIAGNOSIS — E119 Type 2 diabetes mellitus without complications: Secondary | ICD-10-CM | POA: Diagnosis not present

## 2016-12-12 DIAGNOSIS — E785 Hyperlipidemia, unspecified: Secondary | ICD-10-CM | POA: Insufficient documentation

## 2016-12-12 DIAGNOSIS — Z87891 Personal history of nicotine dependence: Secondary | ICD-10-CM | POA: Insufficient documentation

## 2016-12-12 DIAGNOSIS — I259 Chronic ischemic heart disease, unspecified: Secondary | ICD-10-CM

## 2016-12-12 DIAGNOSIS — I493 Ventricular premature depolarization: Secondary | ICD-10-CM | POA: Diagnosis not present

## 2016-12-12 DIAGNOSIS — I272 Pulmonary hypertension, unspecified: Secondary | ICD-10-CM | POA: Diagnosis not present

## 2016-12-12 DIAGNOSIS — Z7982 Long term (current) use of aspirin: Secondary | ICD-10-CM | POA: Diagnosis not present

## 2016-12-12 DIAGNOSIS — I2584 Coronary atherosclerosis due to calcified coronary lesion: Secondary | ICD-10-CM | POA: Insufficient documentation

## 2016-12-12 DIAGNOSIS — I10 Essential (primary) hypertension: Secondary | ICD-10-CM | POA: Diagnosis not present

## 2016-12-12 DIAGNOSIS — F419 Anxiety disorder, unspecified: Secondary | ICD-10-CM | POA: Insufficient documentation

## 2016-12-12 DIAGNOSIS — G4730 Sleep apnea, unspecified: Secondary | ICD-10-CM | POA: Insufficient documentation

## 2016-12-12 HISTORY — PX: CORONARY PRESSURE/FFR STUDY: CATH118243

## 2016-12-12 HISTORY — PX: RIGHT/LEFT HEART CATH AND CORONARY ANGIOGRAPHY: CATH118266

## 2016-12-12 LAB — POCT I-STAT 3, VENOUS BLOOD GAS (G3P V)
ACID-BASE EXCESS: 1 mmol/L (ref 0.0–2.0)
Bicarbonate: 26.7 mmol/L (ref 20.0–28.0)
O2 Saturation: 71 %
PH VEN: 7.39 (ref 7.250–7.430)
TCO2: 28 mmol/L (ref 22–32)
pCO2, Ven: 44.1 mmHg (ref 44.0–60.0)
pO2, Ven: 38 mmHg (ref 32.0–45.0)

## 2016-12-12 LAB — POCT I-STAT 3, ART BLOOD GAS (G3+)
Bicarbonate: 24.9 mmol/L (ref 20.0–28.0)
O2 Saturation: 97 %
PCO2 ART: 40.7 mmHg (ref 32.0–48.0)
PH ART: 7.395 (ref 7.350–7.450)
TCO2: 26 mmol/L (ref 22–32)
pO2, Arterial: 87 mmHg (ref 83.0–108.0)

## 2016-12-12 LAB — POCT ACTIVATED CLOTTING TIME: ACTIVATED CLOTTING TIME: 318 s

## 2016-12-12 LAB — GLUCOSE, CAPILLARY: GLUCOSE-CAPILLARY: 96 mg/dL (ref 65–99)

## 2016-12-12 SURGERY — RIGHT/LEFT HEART CATH AND CORONARY ANGIOGRAPHY
Anesthesia: LOCAL

## 2016-12-12 MED ORDER — ACETAMINOPHEN 325 MG PO TABS
ORAL_TABLET | ORAL | Status: AC
  Administered 2016-12-12: 650 mg via ORAL
  Filled 2016-12-12: qty 2

## 2016-12-12 MED ORDER — SODIUM CHLORIDE 0.9 % IV SOLN
INTRAVENOUS | Status: DC
  Administered 2016-12-12: 13:00:00 via INTRAVENOUS

## 2016-12-12 MED ORDER — HEPARIN SODIUM (PORCINE) 1000 UNIT/ML IJ SOLN
INTRAMUSCULAR | Status: AC
  Filled 2016-12-12: qty 1

## 2016-12-12 MED ORDER — ASPIRIN 81 MG PO CHEW: 81.0000 mg | CHEWABLE_TABLET | ORAL | Status: DC

## 2016-12-12 MED ORDER — LABETALOL HCL 5 MG/ML IV SOLN: 10.0000 mg | INTRAVENOUS | Status: DC | PRN

## 2016-12-12 MED ORDER — FENTANYL CITRATE (PF) 100 MCG/2ML IJ SOLN
INTRAMUSCULAR | Status: AC
  Filled 2016-12-12: qty 2

## 2016-12-12 MED ORDER — SODIUM CHLORIDE 0.9% FLUSH: 3.0000 mL | INTRAVENOUS | Status: DC | PRN

## 2016-12-12 MED ORDER — SODIUM CHLORIDE 0.9 % IV SOLN: INTRAVENOUS | Status: DC

## 2016-12-12 MED ORDER — IOPAMIDOL (ISOVUE-370) INJECTION 76%
INTRAVENOUS | Status: AC
  Filled 2016-12-12: qty 100

## 2016-12-12 MED ORDER — IOPAMIDOL (ISOVUE-370) INJECTION 76%
INTRAVENOUS | Status: DC | PRN
  Administered 2016-12-12: 70 mL

## 2016-12-12 MED ORDER — CLOPIDOGREL BISULFATE 75 MG PO TABS: 75.0000 mg | ORAL_TABLET | Freq: Every day | ORAL | 11 refills | Status: DC

## 2016-12-12 MED ORDER — LIDOCAINE HCL (PF) 1 % IJ SOLN
INTRAMUSCULAR | Status: DC | PRN
  Administered 2016-12-12 (×2): 2 mL

## 2016-12-12 MED ORDER — DIAZEPAM 5 MG PO TABS
10.0000 mg | ORAL_TABLET | ORAL | Status: AC
  Administered 2016-12-12: 10 mg via ORAL

## 2016-12-12 MED ORDER — ADENOSINE 12 MG/4ML IV SOLN
INTRAVENOUS | Status: AC
  Filled 2016-12-12: qty 16

## 2016-12-12 MED ORDER — MIDAZOLAM HCL 2 MG/2ML IJ SOLN
INTRAMUSCULAR | Status: AC
  Filled 2016-12-12: qty 2

## 2016-12-12 MED ORDER — MIDAZOLAM HCL 2 MG/2ML IJ SOLN
INTRAMUSCULAR | Status: DC | PRN
  Administered 2016-12-12: 1 mg via INTRAVENOUS
  Administered 2016-12-12: 0.5 mg via INTRAVENOUS

## 2016-12-12 MED ORDER — SODIUM CHLORIDE 0.9 % IV SOLN: 250.0000 mL | INTRAVENOUS | Status: DC | PRN

## 2016-12-12 MED ORDER — FENTANYL CITRATE (PF) 100 MCG/2ML IJ SOLN
INTRAMUSCULAR | Status: DC | PRN
  Administered 2016-12-12 (×2): 25 ug via INTRAVENOUS

## 2016-12-12 MED ORDER — NITROGLYCERIN 1 MG/10 ML FOR IR/CATH LAB
INTRA_ARTERIAL | Status: AC
Start: 2016-12-12 — End: 2016-12-12
  Filled 2016-12-12: qty 10

## 2016-12-12 MED ORDER — NITROGLYCERIN 0.4 MG SL SUBL: 0.4000 mg | SUBLINGUAL_TABLET | SUBLINGUAL | 99 refills | Status: DC | PRN

## 2016-12-12 MED ORDER — HEPARIN SODIUM (PORCINE) 1000 UNIT/ML IJ SOLN
INTRAMUSCULAR | Status: DC | PRN
  Administered 2016-12-12 (×2): 4000 [IU] via INTRAVENOUS

## 2016-12-12 MED ORDER — ACETAMINOPHEN 325 MG PO TABS
650.0000 mg | ORAL_TABLET | Freq: Four times a day (QID) | ORAL | Status: DC | PRN
  Administered 2016-12-12: 650 mg via ORAL

## 2016-12-12 MED ORDER — HYDRALAZINE HCL 20 MG/ML IJ SOLN: 5.0000 mg | INTRAMUSCULAR | Status: DC | PRN

## 2016-12-12 MED ORDER — SITAGLIPTIN PHOS-METFORMIN HCL 50-500 MG PO TABS: 1.0000 | ORAL_TABLET | ORAL | Status: DC

## 2016-12-12 MED ORDER — SODIUM CHLORIDE 0.9% FLUSH: 3.0000 mL | Freq: Two times a day (BID) | INTRAVENOUS | Status: DC

## 2016-12-12 MED ORDER — VERAPAMIL HCL 2.5 MG/ML IV SOLN
INTRAVENOUS | Status: DC | PRN
  Administered 2016-12-12: 10 mL via INTRA_ARTERIAL

## 2016-12-12 MED ORDER — NITROGLYCERIN 1 MG/10 ML FOR IR/CATH LAB
INTRA_ARTERIAL | Status: DC | PRN
Start: 2016-12-12 — End: 2016-12-12
  Administered 2016-12-12: 200 ug via INTRACORONARY

## 2016-12-12 MED ORDER — VERAPAMIL HCL 2.5 MG/ML IV SOLN
INTRAVENOUS | Status: AC
  Filled 2016-12-12: qty 2

## 2016-12-12 MED ORDER — HEPARIN (PORCINE) IN NACL 2-0.9 UNIT/ML-% IJ SOLN
INTRAMUSCULAR | Status: AC
  Filled 2016-12-12: qty 1000

## 2016-12-12 MED ORDER — HEPARIN (PORCINE) IN NACL 2-0.9 UNIT/ML-% IJ SOLN
INTRAMUSCULAR | Status: DC | PRN
  Administered 2016-12-12: 15:00:00

## 2016-12-12 MED ORDER — DIAZEPAM 5 MG PO TABS
ORAL_TABLET | ORAL | Status: AC
  Administered 2016-12-12: 10 mg via ORAL
  Filled 2016-12-12: qty 2

## 2016-12-12 MED ORDER — ISOSORBIDE MONONITRATE ER 30 MG PO TB24: 30.0000 mg | ORAL_TABLET | Freq: Every day | ORAL | 11 refills | Status: DC

## 2016-12-12 MED ORDER — LIDOCAINE HCL (PF) 1 % IJ SOLN
INTRAMUSCULAR | Status: AC
  Filled 2016-12-12: qty 30

## 2016-12-12 SURGICAL SUPPLY — 16 items
CATH BALLN WEDGE 5F 110CM (CATHETERS) ×1 IMPLANT
CATH IMPULSE 5F ANG/FL3.5 (CATHETERS) ×1 IMPLANT
CATH VISTA GUIDE 6FR XB3 (CATHETERS) ×1 IMPLANT
DEVICE RAD COMP TR BAND LRG (VASCULAR PRODUCTS) ×1 IMPLANT
GLIDESHEATH SLEND SS 6F .021 (SHEATH) ×1 IMPLANT
GUIDEWIRE INQWIRE 1.5J.035X260 (WIRE) IMPLANT
GUIDEWIRE PRESSURE COMET II (WIRE) ×1 IMPLANT
INQWIRE 1.5J .035X260CM (WIRE) ×2
KIT HEART LEFT (KITS) ×2 IMPLANT
PACK CARDIAC CATHETERIZATION (CUSTOM PROCEDURE TRAY) ×2 IMPLANT
SHEATH GLIDE SLENDER 4/5FR (SHEATH) ×1 IMPLANT
SYR MEDRAD MARK V 150ML (SYRINGE) ×2 IMPLANT
TRANSDUCER W/STOPCOCK (MISCELLANEOUS) ×2 IMPLANT
TUBING CIL FLEX 10 FLL-RA (TUBING) ×2 IMPLANT
VALVE GUARDIAN II ~~LOC~~ HEMO (MISCELLANEOUS) ×1 IMPLANT
WIRE HI TORQ VERSACORE-J 145CM (WIRE) ×1 IMPLANT

## 2016-12-12 NOTE — Discharge Instructions (Signed)

## 2016-12-12 NOTE — Brief Op Note (Signed)
BRIEF CARDIAC CATHETERIZATION NOTE  DATE: 12/12/2016 TIME: 3:43 PM  PATIENT:  Sheila Reynolds  71 y.o. female  PRE-OPERATIVE DIAGNOSIS:  Chest pain and shortness of breath  POST-OPERATIVE DIAGNOSIS:  Same  PROCEDURE:  Procedure(s): RIGHT/LEFT HEART CATH AND CORONARY ANGIOGRAPHY (N/A) INTRAVASCULAR PRESSURE WIRE/FFR STUDY (N/A)  SURGEON:  Surgeon(s) and Role:    Nelva Bush, MD - Primary  FINDINGS: 1. Significant single-vessel coronary artery disease with long, calcified 70% proximal/mid LAD stenosis (resting PD/PA 0.79). 2. No significant CAD involving the LCx or RCA. 3. Upper normal to mildly elevated left and right heart filling pressures.  RECOMMENDATIONS: 1. Optimize medical therapy with addition of isosorbide mononitrate. 2. Add clopidogrel to aspirin for dual antiplatelet therapy. 3. Plan to have [name] be reevaluated in office in 1-2 weeks.  If she continues to be symptomatic at that time, we will schedule her for PCI to the proximal/mid LAD with orbital atherectomy.  Nelva Bush, MD [facility] Pager: [phone]

## 2016-12-12 NOTE — H&P (View-Only) (Signed)
CARDIOLOGY OFFICE NOTE  Date:  12/10/2016    Maricela Bo Hollingshead Date of Birth: 10-04-1945 Medical Record #[id number]  PCP:  Glendale Chard, MD  Cardiologist:  [name]   Chief Complaint  Patient presents with  . Chest Pain  . Coronary Artery Disease    Pre cath visit - seen for Dr. Acie Fredrickson    History of Present Illness: JATARA HUETTNER is a 71 y.o. female who presents today for a pre cath visit. Seen for Dr. Acie Fredrickson.   She has a history of known moderate CAD per remote cath in 2009. She has had chronic chest pain. Other issues include sarcoidosis, NSVT and HLD.   She has had multiple Myoviews over the years - last in July of 2018.   Recent CT of the chest concerning for atherosclerosis. Dr. Acie Fredrickson had recommended cardiac catheterization if she was having issues - patient reported chest tingling and shortness of breath - thus added to my schedule.   Comes in today. Here alone. Lots of fatigue. Says she "is tired and weak all the time". No real exercise. Has had chest pain and shortness of breath. Feels like a pins and needle sensation - goes down the left side but will move to the right side. This has been chronic. She is anxious to proceed on with cardiac catheterization.   Past Medical History:  Diagnosis Date  . Anemia    3 months ago anemic  . Anxiety    on meds  . Bronchitis with emphysema   . Cancer ([place])   . Chest pain   . Coronary artery disease    Mild to moderate CAD  . Diabetes mellitus without complication ([place])   . Dysrhythmia    has fluttering at times  . Family history of anesthesia complication    daughter N/V  . Fibromyalgia   . GERD (gastroesophageal reflux disease)    on meds  . Headache(784.0)   . Heart murmur   . Hx of echocardiogram    Echo (03/2013):  Tech limited; Mild focal basal septal hypertrophy, EF 60-65%, normal RVF  . Hyperlipidemia   . Hypertension   . Nonsustained ventricular tachycardia ([place])   . Palpitations   . PVC  (premature ventricular contraction)    a. Holter 12/16: NSR, occ PAC,PVCs  . Shortness of breath    loses breathe at times and have to take extra breathes  . Sleep apnea    CPAP    Past Surgical History:  Procedure Laterality Date  . BREAST SURGERY  approx 1992   bilateral breast surgery for fibrocystic breast disease  . CARDIAC CATHETERIZATION  05/04/2007   reveals overall normal left ventricular systolic function. Ejection fraction 65-70%  . CATARACT EXTRACTION W/PHACO Right 07/20/2013   Procedure: CATARACT EXTRACTION PHACO AND INTRAOCULAR LENS PLACEMENT (IOC);  Surgeon: Marylynn Pearson, MD;  Location: [place];  Service: Ophthalmology;  Laterality: Right;  . COLONOSCOPY W/ BIOPSIES AND POLYPECTOMY    . COLONOSCOPY WITH PROPOFOL N/A 09/07/2014   Procedure: COLONOSCOPY WITH PROPOFOL;  Surgeon: Juanita Craver, MD;  Location: [facility];  Service: Endoscopy;  Laterality: N/A;  . ESOPHAGOGASTRODUODENOSCOPY (EGD) WITH PROPOFOL N/A 09/07/2014   Procedure: ESOPHAGOGASTRODUODENOSCOPY (EGD) WITH PROPOFOL;  Surgeon: Juanita Craver, MD;  Location: [facility];  Service: Endoscopy;  Laterality: N/A;  . EXTERNAL EAR SURGERY     tumors removed  . GLAUCOMA SURGERY    . MINI SHUNT INSERTION Right 07/20/2013   Procedure: INSERTION OF GLAUCOMA FILTRATION DEVICE RIGHT EYE;  Surgeon: Marylynn Pearson, MD;  Location: [place];  Service: Ophthalmology;  Laterality: Right;  . MITOMYCIN C APPLICATION Right 0/11/2723   Procedure: MITOMYCIN C APPLICATION;  Surgeon: Marylynn Pearson, MD;  Location: [place];  Service: Ophthalmology;  Laterality: Right;  . MITOMYCIN C APPLICATION Right 3/66/4403   Procedure: MITOMYCIN C APPLICATION RIGHT EYE;  Surgeon: Marylynn Pearson, MD;  Location: [place];  Service: Ophthalmology;  Laterality: Right;  . TONSILLECTOMY    . TOTAL ABDOMINAL HYSTERECTOMY  1982  . TRABECULECTOMY Right 02/21/2015   Procedure: TRABECULECTOMY WITH [facility] ON THE RIGHT EYE;  Surgeon: Marylynn Pearson, MD;  Location: [place];  Service:  Ophthalmology;  Laterality: Right;     Medications: Current Meds  Medication Sig  . aspirin EC 81 MG tablet Take 81 mg by mouth daily.  [name] atorvastatin (LIPITOR) 20 MG tablet Take 1 tablet (20 mg total) by mouth daily.  . benzonatate (TESSALON) 200 MG capsule Take 1 capsule (200 mg total) by mouth 3 (three) times daily as needed for cough.  . brimonidine (ALPHAGAN P) 0.1 % SOLN Place 1 drop into the left eye 2 (two) times daily. Reported on 02/15/2015  . brinzolamide (AZOPT) 1 % ophthalmic suspension Place 1 drop into the left eye 3 (three) times daily.   . budesonide-formoterol (SYMBICORT) 160-4.5 MCG/ACT inhaler Inhale 2 puffs into the lungs 2 (two) times daily.  [name] esomeprazole (NEXIUM) 40 MG capsule Take 40 mg by mouth 2 (two) times daily as needed (reflux).   . furosemide (LASIX) 20 MG tablet Take 1 tablet (20 mg total) by mouth every 3 (three) days.  [name] JANUMET 50-500 MG per tablet Take 1 tablet by mouth Twice daily.  [name] loteprednol (LOTEMAX) 0.5 % ophthalmic suspension Place 1 drop into the left eye 2 (two) times daily as needed (inflammation).   . metoprolol (LOPRESSOR) 50 MG tablet Take 1 tablet (50 mg total) by mouth 2 (two) times daily. May start with 25 mg twice daily for first few weeks  . metoprolol tartrate (LOPRESSOR) 50 MG tablet Take 1/2 tablet in the a.m. And 1 tablet by mouth in the afternoon  . montelukast (SINGULAIR) 10 MG tablet Take 10 mg by mouth at bedtime.  [name] olmesartan (BENICAR) 20 MG tablet Take 20 mg by mouth daily.  . pregabalin (LYRICA) 75 MG capsule Take 75 mg by mouth at bedtime.  [name] PROAIR HFA 108 (90 Base) MCG/ACT inhaler Inhale 2 puffs into the lungs every 6 (six) hours as needed for wheezing.  . Travoprost (TRAVATAN OP) Place 1 drop into both eyes at bedtime.   . TURMERIC PO Take by mouth.  . [DISCONTINUED] chlorpheniramine-HYDROcodone (TUSSIONEX PENNKINETIC ER) 10-8 MG/5ML SUER Take 5 mLs by mouth every 12 (twelve) hours as needed for cough.      Allergies: Allergies  Allergen Reactions  . Crestor [Rosuvastatin Calcium] Other (See Comments)    muscle aches  . Shellfish Allergy Itching and Other (See Comments)    Crab, shrimp and lobster ---lips itch and tingle  . Demerol [Meperidine] Other (See Comments)    Hallucinations     Social History: The patient  reports that she quit smoking about 36 years ago. Her smoking use included Cigarettes. She has a 4.00 pack-year smoking history. She has never used smokeless tobacco. She reports that she does not drink alcohol or use drugs.   Family History: The patient's family history includes Cancer in her mother and paternal grandmother; Diabetes in her brother and father; Glaucoma in her brother and  father; Heart disease in her father and paternal grandmother.   Review of Systems: Please see the history of present illness.   Otherwise, the review of systems is positive for none.   All other systems are reviewed and negative.   Physical Exam: VS:  BP (!) 150/84   Pulse 62   Ht 5\' 2"  (1.575 m)   Wt 165 lb 6.4 oz (75 kg)   BMI 30.25 kg/m  .  BMI Body mass index is 30.25 kg/m.  Wt Readings from Last 3 Encounters:  12/10/16 165 lb 6.4 oz (75 kg)  11/18/16 165 lb (74.8 kg)  09/04/16 160 lb (72.6 kg)    General: Pleasant. Little anxious. Alert and in no acute distress.   HEENT: Normal.  Neck: Supple, no JVD, carotid bruits, or masses noted.  Cardiac: Regular rate and rhythm. No murmurs, rubs, or gallops. No edema.  Respiratory:  Lungs are clear to auscultation bilaterally with normal work of breathing.  GI: Soft and nontender.  MS: No deformity or atrophy. Gait and ROM intact.  Skin: Warm and dry. Color is normal.  Neuro:  Strength and sensation are intact and no gross focal deficits noted.  Psych: Alert, appropriate and with normal affect.   LABORATORY DATA:  EKG:  EKG is ordered today. This demonstrates NSR.  Lab Results  Component Value Date   WBC 9.4 08/18/2016    HGB 12.0 08/18/2016   HCT 35.8 (L) 08/18/2016   PLT 223.0 08/18/2016   GLUCOSE 118 (H) 08/18/2016   CHOL 133 05/07/2016   TRIG 80 05/07/2016   HDL 50 05/07/2016   LDLCALC 67 05/07/2016   ALT 23 03/19/2016   AST 27 03/19/2016   NA 139 08/18/2016   K 3.9 08/18/2016   CL 102 08/18/2016   CREATININE 0.76 08/18/2016   BUN 15 08/18/2016   CO2 31 08/18/2016   TSH 0.796 07/17/2016     BNP (last 3 results) No results for input(s): BNP in the last 8760 hours.  ProBNP (last 3 results) No results for input(s): PROBNP in the last 8760 hours.   Other Studies Reviewed Today:  CT CHEST IMPRESSION 11/2016: Stable peribronchovascular nodularity in the posterior right upper lobe, favoring post infectious/inflammatory scarring.  Aortic Atherosclerosis (ICD10-I70.0).   Electronically Signed   By: Julian Hy M.D.   On: 11/21/2016 18:13    ANGIOGRAPHY MARCH 2009:   Left main:  The left main is smooth and normal.   The left anterior descending artery has sequential 40% stenosis in the  proximal vessel.  The second stenosis is just prior to the bifurcation  of the first diagonal.  The mid and distal LAD had only minor luminal  irregularities.   The first diagonal has a 30-40% proximal stenosis.  There is  calcification in this proximal vessel.   The left circumflex artery is large and is codominant.  The obtuse  marginal artery has minor luminal irregularities.  The distal circumflex  is essentially normal.   The posterior descending artery is relatively small but has minor  luminal irregularities.   The right coronary artery is small and is codominant.  It supplies a  very small posterior descending artery which is unremarkable.   Left ventriculogram was performed in the 30-degree RAO position.  It  reveals overall normal left ventricular systolic function.  Ejection  fraction 65-70%.   COMPLICATIONS:  None.   CONCLUSION:  Moderate coronary artery  disease primarily involving the  left anterior descending.  At this  point, I do not think that this LAD  is ischemic, although we will need to do a Cardiolite study to verify  this for sure.  We will continue with medical therapy for the time  being.           ______________________________  Thayer Headings, M.D.  Notes recorded by Emmaline Life, RN on 12/02/2016 at 9:50 AM EDT Reviewed results with patient. Patient c/o SOB and chest tingling. Per Dr. Acie Fredrickson, patient should be scheduled for ov and scheduled for cardiac cath. Patient states she would like to think about this and talk to her husband and will call back. She thanked me for the call. ------  Notes recorded by [name], Wonda Cheng, MD on 11/30/2016 at 6:50 PM EDT We have done numerous myoview studies over the years - all have been stable Will continue to follow clinically  Cath 05/04/07 showed moderate CAD.  She has not had concerning symptoms recently . Would have a low threshold to repeat cath if she has symptoms suggestive of CAD   Assessment/Plan:  1. Known CAD - multiple complaints - cardiac catheterization has been recommended. The patient understands that risks include but are not limited to stroke (1 in 1000), death (1 in 41), kidney failure [usually temporary] (1 in 500), bleeding (1 in 200), allergic reaction [possibly serious] (1 in 200), and agrees to proceed. Scheduled for Dr. Saunders Revel this Friday.   2. HLD - on statin therapy  3. Sarcoidosis  4. DM - holding Janumet as of today  5. HTN - will need to follow. I think BP is elevated today due to anxiety.   Current medicines are reviewed with the patient today.  The patient does not have concerns regarding medicines other than what has been noted above.  The following changes have been made:  See above.  Labs/ tests ordered today include:    Orders Placed This Encounter  Procedures  . Basic metabolic panel  . CBC  . PT and PTT  . EKG 12-Lead      Disposition:   Further disposition to follow.    Patient is agreeable to this plan and will call if any problems develop in the interim.   SignedTruitt Merle, NP  12/10/2016 9:28 AM  [place] [address] [place], [place]  [address] Phone: [phone] Fax: [phone]

## 2016-12-12 NOTE — Interval H&P Note (Signed)
History and Physical Interval Note:  12/12/2016 2:15 PM  Timiya H Flock  has presented today for cardiac catheterization, with the diagnosis of chest pain and shortness of breath.  The various methods of treatment have been discussed with the patient and family. After consideration of risks, benefits and other options for treatment, the patient has consented to  Procedure(s): LEFT HEART CATH AND CORONARY ANGIOGRAPHY (N/A) and right heart catheterization as a surgical intervention .  The patient's history has been reviewed, patient examined, no change in status, stable for surgery.  I have reviewed the patient's chart and labs.  Questions were answered to the patient's satisfaction.    Cath Lab Visit (complete for each Cath Lab visit)  Clinical Evaluation Leading to the Procedure:   ACS: No.  Non-ACS:    Anginal Classification: CCS IV  Anti-ischemic medical therapy: Minimal Therapy (1 class of medications)  Non-Invasive Test Results: Low-risk stress test findings: cardiac mortality <1%/year  Prior CABG: No previous CABG  Mckinzey Entwistle

## 2016-12-15 ENCOUNTER — Encounter (HOSPITAL_COMMUNITY): Payer: Self-pay | Admitting: Internal Medicine

## 2016-12-22 ENCOUNTER — Ambulatory Visit (INDEPENDENT_AMBULATORY_CARE_PROVIDER_SITE_OTHER): Payer: Medicare Other | Admitting: Cardiovascular Disease

## 2016-12-22 ENCOUNTER — Encounter: Payer: Self-pay | Admitting: Cardiovascular Disease

## 2016-12-22 VITALS — BP 122/80 | HR 64 | Ht 62.0 in | Wt 164.0 lb

## 2016-12-22 DIAGNOSIS — I209 Angina pectoris, unspecified: Secondary | ICD-10-CM | POA: Diagnosis not present

## 2016-12-22 DIAGNOSIS — I25118 Atherosclerotic heart disease of native coronary artery with other forms of angina pectoris: Secondary | ICD-10-CM

## 2016-12-22 DIAGNOSIS — I259 Chronic ischemic heart disease, unspecified: Secondary | ICD-10-CM | POA: Diagnosis not present

## 2016-12-22 NOTE — Patient Instructions (Addendum)
Medication Instructions:  Your physician recommends that you continue on your current medications as directed. Please refer to the Current Medication list given to you today.   Labwork: None Ordered   Testing/Procedures: Your physician has requested that you have a cardiac catheterization. Cardiac catheterization is used to diagnose and/or treat various heart conditions. Doctors may recommend this procedure for a number of different reasons. The most common reason is to evaluate chest pain. Chest pain can be a symptom of coronary artery disease (CAD), and cardiac catheterization can show whether plaque is narrowing or blocking your heart's arteries. This procedure is also used to evaluate the valves, as well as measure the blood flow and oxygen levels in different parts of your heart. For further information please visit [email or web address]. Please follow instruction sheet, as given.   Follow-Up: Your physician recommends that you schedule a follow-up appointment on December 3 with APP in preparation for Cardiac Cath on 12/6  Your physician recommends that you schedule a follow-up appointment in: 3 months with Dr. Tracey Harries [facility] CARDIOVASCULAR DIVISION [place] OFFICE [address], [place] [address] [place] [address] Dept: [phone] Loc: [place]  12/22/2016  You are scheduled for a Cardiac Catheterization on Thursday, December 6 with Dr. Harrell Gave End.  1. Please arrive at the [facility] (Main Entrance A) at [facility]: [place], [place] [address] at 5:30 AM (two hours before your procedure to ensure your preparation). Free valet parking service is available.   Special note: Every effort is made to have your procedure done on time. Please understand that emergencies sometimes delay scheduled procedures.  2. Diet: Do not eat or drink anything after midnight prior to your procedure  except sips of water to take medications.  3. Labs: You will need to have blood drawn on Tuesday, December 3 at [facility] at [facility]. [address]  Open: 7:30am - 5pm    Phone: [phone]. You do not need to be fasting.  4. Medication instructions in preparation for your procedure: Do not take Furosemide (Lasix) on the morning of your procedure   On the morning of your procedure, take your Aspirin and Plavix and any morning medicines NOT listed above.  You may use sips of water.  5. Plan for one night stay--bring personal belongings. 6. Bring a current list of your medications and current insurance cards. 7. You MUST have a responsible person to drive you home. 8. Someone MUST be with you the first 24 hours after you arrive home or your discharge will be delayed. 9. Please wear clothes that are easy to get on and off and wear slip-on shoes.  Thank you for allowing [place] to care for you!   -- [facility]    If you need a refill on your cardiac medications before your next appointment, please call your pharmacy.   Thank you for choosing [facility]! Christen Bame, RN [phone]

## 2016-12-22 NOTE — Progress Notes (Signed)
Cardiology Office Note   Date:  12/22/2016   ID:  Sheila, Reynolds Apr 27, 1945, MRN [id number]  PCP:  Glendale Chard, MD  Cardiologist:   Mertie Moores, MD   Chief Complaint  Patient presents with  . Palpitations   1. Hyperlipidemia 2. Chest pain 3. Nonsustained ventricular tachycardia 4. Mild to moderate coronary artery disease by cath in 2009. 5. 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 Darrick Penna is a 71 y.o. female with the above noted hx. She has continued to have some palpitations. She has occasional episodes of lightheadedness when she has palpitations. Her main issue has been lots of total body cramps. She has been seen in the emergency room. Her lab work looked fine.  She's had lots of dyspnea especially with exertion. She has a history of sarcoidosis and is followed by Dr. Annamaria Boots.  March 11, 2012: Sheila Reynolds has had more palpitations recently. It Appears that her metoprolol dose was decreased slightly. We placed an event monitor on her. She did not have any episodes of atrial fibrillation. She has occasional drinker atrial contractions and occasional premature ventricular contractions. There were no life-threatening arrhythmias.  She is doing well from a cardiac standpoint. She's not had any episodes of syncope. She has significant shortness of breath especially when climbing stairs. This is likely due to her sarcoidosis. She denies any chest pain. Her BP is a bit elevated. She avoids salt.   May 02, 2013 :  She presented to see Richardson Dopp in February with some increased palpitations and lightheadedness. He recommended that she decrease her caffeine intake. He placed a 21 day event monitor and instructed her to take an extra metoprolol as needed.  The palpitations can occur at any time. They occur with rest and with exertion. They're not constant. The last 30-45 seconds and occur multiple times through the day.  Sept. 16, 2015:  Still having  palpitations. She has had more leg edema and her medical doctor on Lasix which he now takes about every other day. She was not on potassium replacement. This may be the cause of her increased PVCs.   May 24, 2014:  Sheila Reynolds is a 71 y.o. female who presents for follow up of her palpitations   Still has these palpitations.  No syncope.   Jan. 10, 2017:  Doing ok Still has DOE  Has palpitations on occasion. Not getting much exercise .  expalined a typical exercise program .   Start slow, gradually increase   May 07, 2016;   Sheila Reynolds is seen today for follow up .  Has been having more palpitations Seem to be getting worse  Went to there ER last week -  the ER note does not describe any specific arrhythmias. No passing out but she did get lightheaded when she had palpitations on one occasion   Has rare episodes of atypical CP .  Lasts a second Not associated with exertion .  Walks 1.5 miles 3-4 times a week .   This walking does not cause any palpitation of CP   July 17, 2016 Sheila Reynolds is seen today for follow up  30 day monitor showed no significant arrhythmias  Having leg pain and swelling  during night and day  Not worsened by exercise  No CP or dyspnea - still walking 1.5 miles 3-4 days a week   December 22, 2016:  Sheila Reynolds had a cath recently that showed a 70 % LAD stenosis . Has right  arm pain but this is not exertional . Has pins and needles sensation Has DOE with stairs and carrying something heavy .  Has not really noticed any difference in how she feels on the new meds which include Imdur 30 mg , Plavix 75 ,  + cough, chronic ,  No hemoptysis. Does not exercise regularly .     Past Medical History:  Diagnosis Date  . Anemia    3 months ago anemic  . Anxiety    on meds  . Bronchitis with emphysema   . Cancer ([place])   . Chest pain   . Coronary artery disease    Mild to moderate CAD  . Diabetes mellitus without complication ([place])   .  Dysrhythmia    has fluttering at times  . Family history of anesthesia complication    daughter N/V  . Fibromyalgia   . GERD (gastroesophageal reflux disease)    on meds  . Headache(784.0)   . Heart murmur   . Hx of echocardiogram    Echo (03/2013):  Tech limited; Mild focal basal septal hypertrophy, EF 60-65%, normal RVF  . Hyperlipidemia   . Hypertension   . Nonsustained ventricular tachycardia ([place])   . Palpitations   . PVC (premature ventricular contraction)    a. Holter 12/16: NSR, occ PAC,PVCs  . Shortness of breath    loses breathe at times and have to take extra breathes  . Sleep apnea    CPAP    Past Surgical History:  Procedure Laterality Date  . BREAST SURGERY  approx 1992   bilateral breast surgery for fibrocystic breast disease  . CARDIAC CATHETERIZATION  05/04/2007   reveals overall normal left ventricular systolic function. Ejection fraction 65-70%  . COLONOSCOPY W/ BIOPSIES AND POLYPECTOMY    . EXTERNAL EAR SURGERY     tumors removed  . GLAUCOMA SURGERY    . TONSILLECTOMY    . TOTAL ABDOMINAL HYSTERECTOMY  1982     Current Outpatient Medications  Medication Sig Dispense Refill  . acetaminophen (TYLENOL) 500 MG tablet Take 1,000 mg by mouth 2 (two) times daily as needed for moderate pain or headache.    . albuterol (PROVENTIL) (2.5 MG/3ML) 0.083% nebulizer solution Take 2.5 mg by nebulization daily as needed for wheezing or shortness of breath.     [name] aspirin EC 81 MG tablet Take 81 mg by mouth daily.    [name] atorvastatin (LIPITOR) 20 MG tablet Take 1 tablet (20 mg total) by mouth daily. 30 tablet 11  . benzonatate (TESSALON) 200 MG capsule Take 1 capsule (200 mg total) by mouth 3 (three) times daily as needed for cough. 30 capsule 1  . brimonidine (ALPHAGAN P) 0.1 % SOLN Place 1 drop into the left eye 3 (three) times daily.     . brinzolamide (AZOPT) 1 % ophthalmic suspension Place 1 drop into the left eye 3 (three) times daily.     . budesonide-formoterol  (SYMBICORT) 160-4.5 MCG/ACT inhaler Inhale 2 puffs into the lungs 2 (two) times daily. 1 Inhaler 0  . clopidogrel (PLAVIX) 75 MG tablet Take 1 tablet (75 mg total) by mouth daily. 30 tablet 11  . esomeprazole (NEXIUM) 20 MG capsule Take 20 mg by mouth daily at 12 noon. May take an additional 20 mgs by mouth once daily as needed for acid reflux    . furosemide (LASIX) 20 MG tablet Take 1 tablet (20 mg total) by mouth every 3 (three) days. 30 tablet 5  . guaiFENesin (ROBITUSSIN)  100 MG/5ML SOLN Take 15 mLs by mouth every 6 (six) hours as needed for cough or to loosen phlegm.    . isosorbide mononitrate (IMDUR) 30 MG 24 hr tablet Take 1 tablet (30 mg total) by mouth daily. 30 tablet 11  . loteprednol (LOTEMAX) 0.5 % ophthalmic suspension Instill 4 drops into the left eye once daily, and 3 drops in the right eye once daily    . Menthol, Topical Analgesic, (BIOFREEZE EX) Apply 1 application topically daily as needed (foot pain).    . metoprolol tartrate (LOPRESSOR) 50 MG tablet TAKE 25 MG BY  MOUTH IN THE MORNING AND 50 MG BY MOUTH AT NIGHT    . montelukast (SINGULAIR) 10 MG tablet Take 10 mg by mouth at bedtime.    . naproxen sodium (ALEVE) 220 MG tablet Take 440 mg by mouth daily as needed (pain).    . nitroGLYCERIN (NITROSTAT) 0.4 MG SL tablet Place 1 tablet (0.4 mg total) under the tongue every 5 (five) minutes as needed for chest pain. 25 tablet prn  . olmesartan (BENICAR) 20 MG tablet Take 20 mg by mouth daily.    . pregabalin (LYRICA) 75 MG capsule Take 75 mg by mouth at bedtime.    [name] PROAIR HFA 108 (90 Base) MCG/ACT inhaler Inhale 2 puffs into the lungs every 6 (six) hours as needed for wheezing. 1 Inhaler 12  . sitaGLIPtin-metformin (JANUMET) 50-500 MG tablet Take 1 tablet by mouth as directed. Take 1 tablet twice daily on Monday through Friday, take 1 tablet daily on Saturdays and Sundays    . timolol (BETIMOL) 0.5 % ophthalmic solution Place 1 drop into the left eye 2 (two) times daily.    [name]  tolnaftate (TINACTIN) 1 % cream Apply 1 application topically daily as needed (foot itching).    . travoprost, benzalkonium, (TRAVATAN) 0.004 % ophthalmic solution Place 1 drop into both eyes at bedtime.    . Turmeric 500 MG TABS Take 500 mg by mouth daily.     No current facility-administered medications for this visit.     Allergies:   Crestor [rosuvastatin calcium]; Shellfish allergy; and Demerol [meperidine]    Social History:  The patient  reports that she quit smoking about 36 years ago. Her smoking use included cigarettes. She has a 4.00 pack-year smoking history. she has never used smokeless tobacco. She reports that she does not drink alcohol or use drugs.   Family History:  The patient's family history includes Cancer in her mother and paternal grandmother; Diabetes in her brother and father; Glaucoma in her brother and father; Heart disease in her father and paternal grandmother.    ROS:  Please see the history of present illness.  As noted in the history.  All other systems are negative   Physical Exam: Blood pressure 122/80, pulse 64, height 5\' 2"  (1.575 m), weight 164 lb (74.4 kg), SpO2 98 %.  GEN:  Well nourished, well developed in no acute distress HEENT: Normal NECK: No JVD; No carotid bruits LYMPHATICS: No lymphadenopathy CARDIAC: RR, no murmurs, rubs, gallops RESPIRATORY:  Clear to auscultation without rales, wheezing or rhonchi  ABDOMEN: Soft, non-tender, non-distended MUSCULOSKELETAL:  No edema; No deformity , right radial cath site has a small nodule but is basically well-healed.  Her pulse is intact. SKIN: Warm and dry NEUROLOGIC:  Alert and oriented x 3   EKG:  EKG is ordered today. May 07, 2016:   NSR at 53.     Recent Labs: 03/19/2016: ALT 23  07/17/2016: TSH 0.796 12/10/2016: BUN 10; Creatinine, Ser 0.76; Hemoglobin 11.6; Platelets 217; Potassium 4.1; Sodium 141    Lipid Panel    Component Value Date/Time   CHOL 133 05/07/2016 1031   TRIG 80  05/07/2016 1031   HDL 50 05/07/2016 1031   CHOLHDL 2.7 05/07/2016 1031   CHOLHDL 3 05/24/2014 1650   VLDL 20.8 05/24/2014 1650   LDLCALC 67 05/07/2016 1031      Wt Readings from Last 3 Encounters:  12/22/16 164 lb (74.4 kg)  12/12/16 162 lb (73.5 kg)  12/10/16 165 lb 6.4 oz (75 kg)      Other studies Reviewed: Additional studies/ records that were reviewed today include: . Review of the above records demonstrates:    ASSESSMENT AND PLAN:   1.   Coronary artery disease: Seriyah had a heart cath several weeks ago which revealed a moderate to severe LAD stenosis.  She was started on isosorbide mononitrate and Plavix.  She is not feeling any better although should be noted that she really does not exercise and really does not push herself much at all.  Her chest pains are somewhat atypical.  Dr. Saunders Revel  had proposed that we proceed with rotational atherectomy after the patient had been loaded with Plavix and when the atherectomy representative would be available.  The patient has eye surgery on November 29 and I suspect she may need to hold her Plavix prior to that.  I propose that we have her see 1 of her physician assistants on December 3 and restart the Plavix at that time.  We will attempt to schedule her for rotational atherectomy on December 6 with Dr. Saunders Revel .       2. Hyperlipidemia - continue current medications.   BMP and liver enzymes were drawn recently and are stable   4. Nonsustained ventricular tachycardia 5. Mild to moderate coronary artery disease by cath in 2009. 6.  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 Keturah Barre   Current medicines are reviewed at length with the patient today.  The patient does not have concerns regarding medicines.  The following changes have been made:  no change  Labs/ tests ordered today include:  No orders of the defined types were placed in this encounter.    Disposition:   FU with me in 3 months     Signed, Mertie Moores, MD    12/22/2016 12:16 PM    [facility] [address], [place], [place]  [address] Phone: [phone]; Fax: [phone]

## 2016-12-26 DIAGNOSIS — H2512 Age-related nuclear cataract, left eye: Secondary | ICD-10-CM | POA: Diagnosis not present

## 2016-12-31 ENCOUNTER — Telehealth: Payer: Self-pay | Admitting: Cardiovascular Disease

## 2016-12-31 NOTE — Telephone Encounter (Signed)
New Message     [facility] Pre-operative Risk Assessment    Request for surgical clearance:  1. What type of surgery is being performed? Cataracts surgery left eye  2. When is this surgery scheduled? 11/29  3. Are there any medications that need to be held prior to surgery and how long? Blood thinner TBD  4. Practice name and name of physician performing surgery?  Dr. Jovita [facility]uss[facility]  5. What is your office phone and fax number? [phone] fax [phone]  6. Anesthesia type (None, local, MAC, general) ? Local, MAC   Sheila Reynolds 12/31/2016, 11:44 AM  _________________________________________________________________   (provider comments below)

## 2016-12-31 NOTE — Telephone Encounter (Signed)
    Chart reviewed as part of pre-operative protocol coverage. Discussed with Dr. Acie Fredrickson. Per our discussion, he feels patient is cleared for cataract surgery. If holding of blood thinner is felt necessary, he feels she may hold Plavix for 5 days prior to procedure. She has f/u 01/12/17 with Ermalinda Barrios PA-C and planned rotational atherectomy tentatively scheduled 01/15/17.  Will route this bundled recommendation to requesting provider via Epic fax function. Please call with questions.  Charlie Pitter, PA-C  12/31/2016, 2:41 PM

## 2017-01-05 ENCOUNTER — Telehealth: Payer: Self-pay | Admitting: Cardiovascular Disease

## 2017-01-05 NOTE — Telephone Encounter (Signed)
Faxed medical clearance to Dr Duayne Cal office; spoke with the receptionist and she verified they received the clearance.

## 2017-01-05 NOTE — Telephone Encounter (Signed)
New Message      Fax : [phone]   Refax the pre op clearance to this number

## 2017-01-12 ENCOUNTER — Encounter: Payer: Self-pay | Admitting: Physician Assistant

## 2017-01-12 ENCOUNTER — Ambulatory Visit (INDEPENDENT_AMBULATORY_CARE_PROVIDER_SITE_OTHER): Payer: Medicare Other | Admitting: Physician Assistant

## 2017-01-12 ENCOUNTER — Encounter (INDEPENDENT_AMBULATORY_CARE_PROVIDER_SITE_OTHER): Payer: Self-pay

## 2017-01-12 VITALS — BP 122/72 | HR 71 | Ht 62.0 in | Wt 166.4 lb

## 2017-01-12 DIAGNOSIS — I2510 Atherosclerotic heart disease of native coronary artery without angina pectoris: Secondary | ICD-10-CM | POA: Insufficient documentation

## 2017-01-12 DIAGNOSIS — I25119 Atherosclerotic heart disease of native coronary artery with unspecified angina pectoris: Secondary | ICD-10-CM

## 2017-01-12 DIAGNOSIS — I10 Essential (primary) hypertension: Secondary | ICD-10-CM

## 2017-01-12 DIAGNOSIS — I4729 Other ventricular tachycardia: Secondary | ICD-10-CM

## 2017-01-12 DIAGNOSIS — Z7689 Persons encountering health services in other specified circumstances: Secondary | ICD-10-CM

## 2017-01-12 DIAGNOSIS — I259 Chronic ischemic heart disease, unspecified: Secondary | ICD-10-CM

## 2017-01-12 DIAGNOSIS — R079 Chest pain, unspecified: Secondary | ICD-10-CM

## 2017-01-12 DIAGNOSIS — I209 Angina pectoris, unspecified: Secondary | ICD-10-CM

## 2017-01-12 DIAGNOSIS — I472 Ventricular tachycardia: Secondary | ICD-10-CM | POA: Diagnosis not present

## 2017-01-12 NOTE — Addendum Note (Signed)
Addended by: Gaetano Net on: 01/12/2017 10:43 AM   Modules accepted: Orders

## 2017-01-12 NOTE — Patient Instructions (Addendum)
Medication Instructions:  Your physician recommends that you continue on your current medications as directed. Please refer to the Current Medication list given to you today.   Labwork: TODAY:  BMET, CBC, & PT/INR  Testing/Procedures: None ordered  Follow-Up: Your physician recommends that you schedule a follow-up appointment in: 1-2 [place] APP  You have been referred to NUTRITION / WEIGHT LOSS MANAGEMENT.    Any Other Special Instructions Will Be Listed Below (If Applicable).     If you need a refill on your cardiac medications before your next appointment, please call your pharmacy.

## 2017-01-12 NOTE — H&P (View-Only) (Signed)
Cardiology Office Note    Date:  01/12/2017   ID:  Sheila, Reynolds 07-17-1945, MRN [id number]  PCP:  Glendale Chard, MD  Cardiologist: Dr. Acie Fredrickson  Chief Complaint  Patient presents with  . Follow-up    History of Present Illness:  Sheila Reynolds is a 71 y.o. female who had a cath 12/12/16 revealing 70% LAD.  She was started on isosorbide and Plavix.  She saw Dr. Acie Fredrickson 12/22/16 and was not feeling any better.  He discussed her with Dr. Saunders Revel in her with Plavix and scheduling a rotational atherectomy when the atherectomy representative will be available December 6.  She is here for preop.  She says she continues to have cramping in her chest and dyspnea on exertion.  She was cleared to have cataract surgery November 29 and held her Plavix for 3 days but the ophthalmologist felt she was too high risk and did not want to do the surgery.  She restarted Plavix that day.    Past Medical History:  Diagnosis Date  . Anemia    3 months ago anemic  . Anxiety    on meds  . Bronchitis with emphysema   . Cancer ([place])   . Chest pain   . Coronary artery disease    Mild to moderate CAD  . Diabetes mellitus without complication ([place])   . Dysrhythmia    has fluttering at times  . Family history of anesthesia complication    daughter N/V  . Fibromyalgia   . GERD (gastroesophageal reflux disease)    on meds  . Headache(784.0)   . Heart murmur   . Hx of echocardiogram    Echo (03/2013):  Tech limited; Mild focal basal septal hypertrophy, EF 60-65%, normal RVF  . Hyperlipidemia   . Hypertension   . Nonsustained ventricular tachycardia ([place])   . Palpitations   . PVC (premature ventricular contraction)    a. Holter 12/16: NSR, occ PAC,PVCs  . Shortness of breath    loses breathe at times and have to take extra breathes  . Sleep apnea    CPAP    Past Surgical History:  Procedure Laterality Date  . BREAST SURGERY  approx 1992   bilateral breast surgery for fibrocystic breast  disease  . CARDIAC CATHETERIZATION  05/04/2007   reveals overall normal left ventricular systolic function. Ejection fraction 65-70%  . CATARACT EXTRACTION W/PHACO Right 07/20/2013   Procedure: CATARACT EXTRACTION PHACO AND INTRAOCULAR LENS PLACEMENT (IOC);  Surgeon: Marylynn Pearson, MD;  Location: [facility];  Service: Ophthalmology;  Laterality: Right;  . COLONOSCOPY W/ BIOPSIES AND POLYPECTOMY    . COLONOSCOPY WITH PROPOFOL N/A 09/07/2014   Procedure: COLONOSCOPY WITH PROPOFOL;  Surgeon: Juanita Craver, MD;  Location: [facility];  Service: Endoscopy;  Laterality: N/A;  . ESOPHAGOGASTRODUODENOSCOPY (EGD) WITH PROPOFOL N/A 09/07/2014   Procedure: ESOPHAGOGASTRODUODENOSCOPY (EGD) WITH PROPOFOL;  Surgeon: Juanita Craver, MD;  Location: [facility];  Service: Endoscopy;  Laterality: N/A;  . EXTERNAL EAR SURGERY     tumors removed  . GLAUCOMA SURGERY    . INTRAVASCULAR PRESSURE WIRE/FFR STUDY N/A 12/12/2016   Procedure: INTRAVASCULAR PRESSURE WIRE/FFR STUDY;  Surgeon: Nelva Bush, MD;  Location: [facility];  Service: Cardiovascular;  Laterality: N/A;  . MINI SHUNT INSERTION Right 07/20/2013   Procedure: INSERTION OF GLAUCOMA FILTRATION DEVICE RIGHT EYE;  Surgeon: Marylynn Pearson, MD;  Location: [facility];  Service: Ophthalmology;  Laterality: Right;  . MITOMYCIN C APPLICATION Right 0/97/3532   Procedure: MITOMYCIN C  APPLICATION;  Surgeon: Marylynn Pearson, MD;  Location: [facility];  Service: Ophthalmology;  Laterality: Right;  . MITOMYCIN C APPLICATION Right 2/69/4854   Procedure: MITOMYCIN C APPLICATION RIGHT EYE;  Surgeon: Marylynn Pearson, MD;  Location: [facility];  Service: Ophthalmology;  Laterality: Right;  . RIGHT/LEFT HEART CATH AND CORONARY ANGIOGRAPHY N/A 12/12/2016   Procedure: RIGHT/LEFT HEART CATH AND CORONARY ANGIOGRAPHY;  Surgeon: Nelva Bush, MD;  Location: [facility];  Service: Cardiovascular;  Laterality: N/A;  . TONSILLECTOMY    . TOTAL ABDOMINAL HYSTERECTOMY  1982  . TRABECULECTOMY Right  02/21/2015   Procedure: TRABECULECTOMY WITH [facility] ON THE RIGHT EYE;  Surgeon: Marylynn Pearson, MD;  Location: [facility];  Service: Ophthalmology;  Laterality: Right;    Current Medications: Current Meds  Medication Sig  . acetaminophen (TYLENOL) 500 MG tablet Take 1,000 mg by mouth 2 (two) times daily as needed for moderate pain or headache.  . albuterol (PROVENTIL) (2.5 MG/3ML) 0.083% nebulizer solution Take 2.5 mg by nebulization daily as needed for wheezing or shortness of breath.   [name] aspirin EC 81 MG tablet Take 81 mg by mouth daily.  [name] atorvastatin (LIPITOR) 20 MG tablet Take 1 tablet (20 mg total) by mouth daily.  . benzonatate (TESSALON) 200 MG capsule Take 1 capsule (200 mg total) by mouth 3 (three) times daily as needed for cough.  . brimonidine (ALPHAGAN P) 0.1 % SOLN Place 1 drop into the left eye 3 (three) times daily.   . brinzolamide (AZOPT) 1 % ophthalmic suspension Place 1 drop into the left eye 3 (three) times daily.   . budesonide-formoterol (SYMBICORT) 160-4.5 MCG/ACT inhaler Inhale 2 puffs into the lungs 2 (two) times daily.  . clopidogrel (PLAVIX) 75 MG tablet Take 1 tablet (75 mg total) by mouth daily.  [name] esomeprazole (NEXIUM) 20 MG capsule Take 20 mg by mouth daily at 12 noon. May take an additional 20 mgs by mouth once daily as needed for acid reflux  . furosemide (LASIX) 20 MG tablet Take 1 tablet (20 mg total) by mouth every 3 (three) days.  [name] guaiFENesin (ROBITUSSIN) 100 MG/5ML SOLN Take 15 mLs by mouth every 6 (six) hours as needed for cough or to loosen phlegm.  . isosorbide mononitrate (IMDUR) 30 MG 24 hr tablet Take 1 tablet (30 mg total) by mouth daily.  [name] loteprednol (LOTEMAX) 0.5 % ophthalmic suspension Instill 4 drops into the left eye once daily, and 3 drops in the right eye once daily  . Menthol, Topical Analgesic, (BIOFREEZE EX) Apply 1 application topically daily as needed (foot pain).  . metoprolol tartrate (LOPRESSOR) 50 MG tablet TAKE 25 MG BY  MOUTH IN THE  MORNING AND 50 MG BY MOUTH AT NIGHT  . montelukast (SINGULAIR) 10 MG tablet Take 10 mg by mouth at bedtime.  . naproxen sodium (ALEVE) 220 MG tablet Take 440 mg by mouth daily as needed (pain).  . nitroGLYCERIN (NITROSTAT) 0.4 MG SL tablet Place 1 tablet (0.4 mg total) under the tongue every 5 (five) minutes as needed for chest pain.  [name] olmesartan (BENICAR) 20 MG tablet Take 20 mg by mouth daily.  . pregabalin (LYRICA) 75 MG capsule Take 75 mg by mouth at bedtime.  [name] PROAIR HFA 108 (90 Base) MCG/ACT inhaler Inhale 2 puffs into the lungs every 6 (six) hours as needed for wheezing.  . sitaGLIPtin-metformin (JANUMET) 50-500 MG tablet Take 1 tablet by mouth as directed. Take 1 tablet twice daily on Monday through Friday, take 1 tablet  daily on Saturdays and Sundays  . timolol (BETIMOL) 0.5 % ophthalmic solution Place 1 drop into the left eye 2 (two) times daily.  [name] tolnaftate (TINACTIN) 1 % cream Apply 1 application topically daily as needed (foot itching).  . travoprost, benzalkonium, (TRAVATAN) 0.004 % ophthalmic solution Place 1 drop into both eyes at bedtime.  . Turmeric 500 MG TABS Take 500 mg by mouth daily.     Allergies:   Crestor [rosuvastatin calcium]; Shellfish allergy; and Demerol [meperidine]   Social History   Socioeconomic History  . Marital status: Married    Spouse name: None  . Number of children: 2  . Years of education: None  . Highest education level: None  Social Needs  . Financial resource strain: None  . Food insecurity - worry: None  . Food insecurity - inability: None  . Transportation needs - medical: None  . Transportation needs - non-medical: None  Occupational History  . Occupation: unemployed    Fish farm manager: [employer]  Tobacco Use  . Smoking status: Former Smoker    Packs/day: 1.00    Years: 4.00    Pack years: 4.00    Types: Cigarettes    Last attempt to quit: 02/11/1980    Years since quitting: 36.9  . Smokeless tobacco: Never Used    Substance and Sexual Activity  . Alcohol use: No  . Drug use: No  . Sexual activity: None  Other Topics Concern  . None  Social History Narrative  . None     Family History:  The patient's family history includes Cancer in her mother and paternal grandmother; Diabetes in her brother and father; Glaucoma in her brother and father; Heart disease in her father and paternal grandmother.   ROS:   Please see the history of present illness.    Review of Systems  Constitution: Negative.  HENT: Negative.   Eyes: Negative.   Cardiovascular: Positive for chest pain and dyspnea on exertion.  Respiratory: Negative.   Hematologic/Lymphatic: Negative.   Musculoskeletal: Positive for myalgias. Negative for joint pain.  Gastrointestinal: Negative.   Genitourinary: Negative.   Neurological: Negative.    All other systems reviewed and are negative.   PHYSICAL EXAM:   VS:  BP 122/72   Pulse 71   Ht 5\' 2"  (1.575 m)   Wt 166 lb 6.4 oz (75.5 kg)   SpO2 98%   BMI 30.43 kg/m   Physical Exam  GEN: Well nourished, well developed, in no acute distress  HEENT: normal  Neck: no JVD, carotid bruits, or masses Cardiac:RRR; no murmurs, rubs, or gallops  Respiratory:  clear to auscultation bilaterally, normal work of breathing GI: soft, nontender, nondistended, + BS Ext: without cyanosis, clubbing, or edema, Good distal pulses bilaterally MS: no deformity or atrophy  Skin: warm and dry, no rash Neuro:  Alert and Oriented x 3, Strength and sensation are intact Psych: euthymic mood, full affect  Wt Readings from Last 3 Encounters:  01/12/17 166 lb 6.4 oz (75.5 kg)  12/22/16 164 lb (74.4 kg)  12/12/16 162 lb (73.5 kg)      Studies/Labs Reviewed:   EKG:  EKG is not ordered today.    Recent Labs: 03/19/2016: ALT 23 07/17/2016: TSH 0.796 12/10/2016: BUN 10; Creatinine, Ser 0.76; Hemoglobin 11.6; Platelets 217; Potassium 4.1; Sodium 141   Lipid Panel    Component Value Date/Time   CHOL 133  05/07/2016 1031   TRIG 80 05/07/2016 1031   HDL 50 05/07/2016 1031  CHOLHDL 2.7 05/07/2016 1031   CHOLHDL 3 05/24/2014 1650   VLDL 20.8 05/24/2014 1650   LDLCALC 67 05/07/2016 1031    Additional studies/ records that were reviewed today include:  2D echo 6/21/18Study Conclusions   - Left ventricle: The cavity size was normal. Wall thickness was   increased in a pattern of mild LVH. Systolic function was normal.   The estimated ejection fraction was in the range of 55% to 60%.   Wall motion was normal; there were no regional wall motion   abnormalities. Doppler parameters are consistent with abnormal   left ventricular relaxation (grade 1 diastolic dysfunction). - Aortic valve: There was no stenosis. - Mitral valve: There was no significant regurgitation. - Left atrium: The atrium was mildly dilated. - Right ventricle: The cavity size was normal. Systolic function   was normal. - Pulmonary arteries: PA peak pressure: 26 mm Hg (S). - Inferior vena cava: The vessel was normal in size. The   respirophasic diameter changes were in the normal range (= 50%),   consistent with normal central venous pressure.   Impressions:   - Normal LV size with mild LV hypertrophy. EF 55-60%. Normal RV   size and systolic function. No significant valvular   abnormalities.  CT of the chest 10/12/18IMPRESSION: Stable peribronchovascular nodularity in the posterior right upper lobe, favoring post infectious/inflammatory scarring.   Aortic Atherosclerosis (ICD10-I70.0).     Electronically Signed   By: Julian Hy M.D.   On: 11/21/2016 18:13  Cardiac catheterization 11/2/18Conclusions: 1. Significant single-vessel coronary artery disease with long, calcified proximal/mid LAD stenosis of up to 70% (resting PD/PA 0.79), as well as 70% ostial D2 stenosis. 2. No significant disease involving codominant LCx and RCA. 3. Upper normal to mildly elevated left and right heart filling  pressures. 4. Mild pulmonary hypertension. 5. Low normal Fick cardiac output/index.   Recommendations: 1. Optimize medical therapy, including addition of isosorbide mononitrate and clopidogrel. 2. Patient should follow-up in 1-2 weeks with Dr. Acie Fredrickson or Truitt Merle to reassess her symptoms.  If she continues to have significant shortness of breath and/or dyspnea, PCI to the proximal/mid LAD with orbital atherectomy should be considered. 3. Aggressive secondary prevention.   Nelva Bush, MD [facility] Pager: [phone]     ASSESSMENT:    1. Coronary artery disease involving native coronary artery of native heart with angina pectoris ([place])   2. NSVT (nonsustained ventricular tachycardia) ([place])   3. Essential hypertension   4. Chest pain, unspecified type   5. Encounter for weight management      PLAN:  In order of problems listed above:  CAD with 70% LAD on cath 12/12/16 with recurrent angina.  Patient has been on Plavix and Imdur for over a month.  She is scheduled for rotational atherectomy 12/16/16 with Dr. Saunders Revel. Will hold janumet 24 hrs before cath and 48 hr after. I have reviewed the risks, indications, and alternatives to angioplasty and stenting with the patient. Risks include but are not limited to bleeding, infection, vascular injury, stroke, myocardial infection, arrhythmia, kidney injury, radiation-related injury in the case of prolonged fluoroscopy use, emergency cardiac surgery, and death. The patient understands the risks of serious complication is low (<3%) and he agrees to proceed.   NSVT on metoprolol  Hypertension controlled  Chest pain and dyspnea on exertion hopefully will improve after atherectomy  Hyperlipidemia on Lipitor, refer to dietitian to help with weight management and diet    Medication Adjustments/Labs and Tests Ordered: Current  medicines are reviewed at length with the patient today.  Concerns regarding medicines are outlined above.   Medication changes, Labs and Tests ordered today are listed in the Patient Instructions below. Patient Instructions  Medication Instructions:  Your physician recommends that you continue on your current medications as directed. Please refer to the Current Medication list given to you today.   Labwork: TODAY:  BMET, CBC, & PT/INR  Testing/Procedures: None ordered  Follow-Up: Your physician recommends that you schedule a follow-up appointment in: 1-2 [facility] APP  You have been referred to NUTRITION / WEIGHT LOSS MANAGEMENT.    Any Other Special Instructions Will Be Listed Below (If Applicable).     If you need a refill on your cardiac medications before your next appointment, please call your pharmacy.     Sumner Boast, PA-C  01/12/2017 10:25 AM    [facility] [address], [place], [place]  [address] Phone: [phone]; Fax: [phone]

## 2017-01-12 NOTE — Progress Notes (Signed)
Cardiology Office Note    Date:  01/12/2017   ID:  Sheila, Reynolds 12/18/1945, MRN [id number]  PCP:  Sheila Chard, MD  Cardiologist: Dr. Acie Fredrickson  Chief Complaint  Patient presents with  . Follow-up    History of Present Illness:  Sheila Reynolds is a 71 y.o. female who had a cath 12/12/16 revealing 70% LAD.  She was started on isosorbide and Plavix.  She saw Dr. Acie Fredrickson 12/22/16 and was not feeling any better.  He discussed her with Dr. Saunders Revel in her with Plavix and scheduling a rotational atherectomy when the atherectomy representative will be available December 6.  She is here for preop.  She says she continues to have cramping in her chest and dyspnea on exertion.  She was cleared to have cataract surgery November 29 and held her Plavix for 3 days but the ophthalmologist felt she was too high risk and did not want to do the surgery.  She restarted Plavix that day.    Past Medical History:  Diagnosis Date  . Anemia    3 months ago anemic  . Anxiety    on meds  . Bronchitis with emphysema   . Cancer ([place])   . Chest pain   . Coronary artery disease    Mild to moderate CAD  . Diabetes mellitus without complication ([place])   . Dysrhythmia    has fluttering at times  . Family history of anesthesia complication    daughter N/V  . Fibromyalgia   . GERD (gastroesophageal reflux disease)    on meds  . Headache(784.0)   . Heart murmur   . Hx of echocardiogram    Echo (03/2013):  Tech limited; Mild focal basal septal hypertrophy, EF 60-65%, normal RVF  . Hyperlipidemia   . Hypertension   . Nonsustained ventricular tachycardia ([place])   . Palpitations   . PVC (premature ventricular contraction)    a. Holter 12/16: NSR, occ PAC,PVCs  . Shortness of breath    loses breathe at times and have to take extra breathes  . Sleep apnea    CPAP    Past Surgical History:  Procedure Laterality Date  . BREAST SURGERY  approx 1992   bilateral breast surgery for fibrocystic breast  disease  . CARDIAC CATHETERIZATION  05/04/2007   reveals overall normal left ventricular systolic function. Ejection fraction 65-70%  . CATARACT EXTRACTION W/PHACO Right 07/20/2013   Procedure: CATARACT EXTRACTION PHACO AND INTRAOCULAR LENS PLACEMENT (IOC);  Surgeon: Marylynn Pearson, MD;  Location: [place];  Service: Ophthalmology;  Laterality: Right;  . COLONOSCOPY W/ BIOPSIES AND POLYPECTOMY    . COLONOSCOPY WITH PROPOFOL N/A 09/07/2014   Procedure: COLONOSCOPY WITH PROPOFOL;  Surgeon: Juanita Craver, MD;  Location: [facility];  Service: Endoscopy;  Laterality: N/A;  . ESOPHAGOGASTRODUODENOSCOPY (EGD) WITH PROPOFOL N/A 09/07/2014   Procedure: ESOPHAGOGASTRODUODENOSCOPY (EGD) WITH PROPOFOL;  Surgeon: Juanita Craver, MD;  Location: [facility];  Service: Endoscopy;  Laterality: N/A;  . EXTERNAL EAR SURGERY     tumors removed  . GLAUCOMA SURGERY    . INTRAVASCULAR PRESSURE WIRE/FFR STUDY N/A 12/12/2016   Procedure: INTRAVASCULAR PRESSURE WIRE/FFR STUDY;  Surgeon: Nelva Bush, MD;  Location: [place] [facility];  Service: Cardiovascular;  Laterality: N/A;  . MINI SHUNT INSERTION Right 07/20/2013   Procedure: INSERTION OF GLAUCOMA FILTRATION DEVICE RIGHT EYE;  Surgeon: Marylynn Pearson, MD;  Location: [place];  Service: Ophthalmology;  Laterality: Right;  . MITOMYCIN C APPLICATION Right 7/51/0258   Procedure: MITOMYCIN C  APPLICATION;  Surgeon: Marylynn Pearson, MD;  Location: [place];  Service: Ophthalmology;  Laterality: Right;  . MITOMYCIN C APPLICATION Right 0/63/0160   Procedure: MITOMYCIN C APPLICATION RIGHT EYE;  Surgeon: Marylynn Pearson, MD;  Location: [place];  Service: Ophthalmology;  Laterality: Right;  . RIGHT/LEFT HEART CATH AND CORONARY ANGIOGRAPHY N/A 12/12/2016   Procedure: RIGHT/LEFT HEART CATH AND CORONARY ANGIOGRAPHY;  Surgeon: Nelva Bush, MD;  Location: [place] [facility];  Service: Cardiovascular;  Laterality: N/A;  . TONSILLECTOMY    . TOTAL ABDOMINAL HYSTERECTOMY  1982  . TRABECULECTOMY Right  02/21/2015   Procedure: TRABECULECTOMY WITH [facility] ON THE RIGHT EYE;  Surgeon: Marylynn Pearson, MD;  Location: [place];  Service: Ophthalmology;  Laterality: Right;    Current Medications: Current Meds  Medication Sig  . acetaminophen (TYLENOL) 500 MG tablet Take 1,000 mg by mouth 2 (two) times daily as needed for moderate pain or headache.  . albuterol (PROVENTIL) (2.5 MG/3ML) 0.083% nebulizer solution Take 2.5 mg by nebulization daily as needed for wheezing or shortness of breath.   [name] aspirin EC 81 MG tablet Take 81 mg by mouth daily.  [name] atorvastatin (LIPITOR) 20 MG tablet Take 1 tablet (20 mg total) by mouth daily.  . benzonatate (TESSALON) 200 MG capsule Take 1 capsule (200 mg total) by mouth 3 (three) times daily as needed for cough.  . brimonidine (ALPHAGAN P) 0.1 % SOLN Place 1 drop into the left eye 3 (three) times daily.   . brinzolamide (AZOPT) 1 % ophthalmic suspension Place 1 drop into the left eye 3 (three) times daily.   . budesonide-formoterol (SYMBICORT) 160-4.5 MCG/ACT inhaler Inhale 2 puffs into the lungs 2 (two) times daily.  . clopidogrel (PLAVIX) 75 MG tablet Take 1 tablet (75 mg total) by mouth daily.  [name] esomeprazole (NEXIUM) 20 MG capsule Take 20 mg by mouth daily at 12 noon. May take an additional 20 mgs by mouth once daily as needed for acid reflux  . furosemide (LASIX) 20 MG tablet Take 1 tablet (20 mg total) by mouth every 3 (three) days.  [name] guaiFENesin (ROBITUSSIN) 100 MG/5ML SOLN Take 15 mLs by mouth every 6 (six) hours as needed for cough or to loosen phlegm.  . isosorbide mononitrate (IMDUR) 30 MG 24 hr tablet Take 1 tablet (30 mg total) by mouth daily.  [name] loteprednol (LOTEMAX) 0.5 % ophthalmic suspension Instill 4 drops into the left eye once daily, and 3 drops in the right eye once daily  . Menthol, Topical Analgesic, (BIOFREEZE EX) Apply 1 application topically daily as needed (foot pain).  . metoprolol tartrate (LOPRESSOR) 50 MG tablet TAKE 25 MG BY  MOUTH IN THE  MORNING AND 50 MG BY MOUTH AT NIGHT  . montelukast (SINGULAIR) 10 MG tablet Take 10 mg by mouth at bedtime.  . naproxen sodium (ALEVE) 220 MG tablet Take 440 mg by mouth daily as needed (pain).  . nitroGLYCERIN (NITROSTAT) 0.4 MG SL tablet Place 1 tablet (0.4 mg total) under the tongue every 5 (five) minutes as needed for chest pain.  [name] olmesartan (BENICAR) 20 MG tablet Take 20 mg by mouth daily.  . pregabalin (LYRICA) 75 MG capsule Take 75 mg by mouth at bedtime.  [name] PROAIR HFA 108 (90 Base) MCG/ACT inhaler Inhale 2 puffs into the lungs every 6 (six) hours as needed for wheezing.  . sitaGLIPtin-metformin (JANUMET) 50-500 MG tablet Take 1 tablet by mouth as directed. Take 1 tablet twice daily on Monday through Friday, take 1 tablet  daily on Saturdays and Sundays  . timolol (BETIMOL) 0.5 % ophthalmic solution Place 1 drop into the left eye 2 (two) times daily.  [name] tolnaftate (TINACTIN) 1 % cream Apply 1 application topically daily as needed (foot itching).  . travoprost, benzalkonium, (TRAVATAN) 0.004 % ophthalmic solution Place 1 drop into both eyes at bedtime.  . Turmeric 500 MG TABS Take 500 mg by mouth daily.     Allergies:   Crestor [rosuvastatin calcium]; Shellfish allergy; and Demerol [meperidine]   Social History   Socioeconomic History  . Marital status: Married    Spouse name: None  . Number of children: 2  . Years of education: None  . Highest education level: None  Social Needs  . Financial resource strain: None  . Food insecurity - worry: None  . Food insecurity - inability: None  . Transportation needs - medical: None  . Transportation needs - non-medical: None  Occupational History  . Occupation: unemployed    Fish farm manager: [employer]  Tobacco Use  . Smoking status: Former Smoker    Packs/day: 1.00    Years: 4.00    Pack years: 4.00    Types: Cigarettes    Last attempt to quit: 02/11/1980    Years since quitting: 36.9  . Smokeless tobacco: Never Used    Substance and Sexual Activity  . Alcohol use: No  . Drug use: No  . Sexual activity: None  Other Topics Concern  . None  Social History Narrative  . None     Family History:  The patient's family history includes Cancer in her mother and paternal grandmother; Diabetes in her brother and father; Glaucoma in her brother and father; Heart disease in her father and paternal grandmother.   ROS:   Please see the history of present illness.    Review of Systems  Constitution: Negative.  HENT: Negative.   Eyes: Negative.   Cardiovascular: Positive for chest pain and dyspnea on exertion.  Respiratory: Negative.   Hematologic/Lymphatic: Negative.   Musculoskeletal: Positive for myalgias. Negative for joint pain.  Gastrointestinal: Negative.   Genitourinary: Negative.   Neurological: Negative.    All other systems reviewed and are negative.   PHYSICAL EXAM:   VS:  BP 122/72   Pulse 71   Ht 5\' 2"  (1.575 m)   Wt 166 lb 6.4 oz (75.5 kg)   SpO2 98%   BMI 30.43 kg/m   Physical Exam  GEN: Well nourished, well developed, in no acute distress  HEENT: normal  Neck: no JVD, carotid bruits, or masses Cardiac:RRR; no murmurs, rubs, or gallops  Respiratory:  clear to auscultation bilaterally, normal work of breathing GI: soft, nontender, nondistended, + BS Ext: without cyanosis, clubbing, or edema, Good distal pulses bilaterally MS: no deformity or atrophy  Skin: warm and dry, no rash Neuro:  Alert and Oriented x 3, Strength and sensation are intact Psych: euthymic mood, full affect  Wt Readings from Last 3 Encounters:  01/12/17 166 lb 6.4 oz (75.5 kg)  12/22/16 164 lb (74.4 kg)  12/12/16 162 lb (73.5 kg)      Studies/Labs Reviewed:   EKG:  EKG is not ordered today.    Recent Labs: 03/19/2016: ALT 23 07/17/2016: TSH 0.796 12/10/2016: BUN 10; Creatinine, Ser 0.76; Hemoglobin 11.6; Platelets 217; Potassium 4.1; Sodium 141   Lipid Panel    Component Value Date/Time   CHOL 133  05/07/2016 1031   TRIG 80 05/07/2016 1031   HDL 50 05/07/2016 1031  CHOLHDL 2.7 05/07/2016 1031   CHOLHDL 3 05/24/2014 1650   VLDL 20.8 05/24/2014 1650   LDLCALC 67 05/07/2016 1031    Additional studies/ records that were reviewed today include:  2D echo 6/21/18Study Conclusions   - Left ventricle: The cavity size was normal. Wall thickness was   increased in a pattern of mild LVH. Systolic function was normal.   The estimated ejection fraction was in the range of 55% to 60%.   Wall motion was normal; there were no regional wall motion   abnormalities. Doppler parameters are consistent with abnormal   left ventricular relaxation (grade 1 diastolic dysfunction). - Aortic valve: There was no stenosis. - Mitral valve: There was no significant regurgitation. - Left atrium: The atrium was mildly dilated. - Right ventricle: The cavity size was normal. Systolic function   was normal. - Pulmonary arteries: PA peak pressure: 26 mm Hg (S). - Inferior vena cava: The vessel was normal in size. The   respirophasic diameter changes were in the normal range (= 50%),   consistent with normal central venous pressure.   Impressions:   - Normal LV size with mild LV hypertrophy. EF 55-60%. Normal RV   size and systolic function. No significant valvular   abnormalities.  CT of the chest 10/12/18IMPRESSION: Stable peribronchovascular nodularity in the posterior right upper lobe, favoring post infectious/inflammatory scarring.   Aortic Atherosclerosis (ICD10-I70.0).     Electronically Signed   By: Julian Hy M.D.   On: 11/21/2016 18:13  Cardiac catheterization 11/2/18Conclusions: 1. Significant single-vessel coronary artery disease with long, calcified proximal/mid LAD stenosis of up to 70% (resting PD/PA 0.79), as well as 70% ostial D2 stenosis. 2. No significant disease involving codominant LCx and RCA. 3. Upper normal to mildly elevated left and right heart filling  pressures. 4. Mild pulmonary hypertension. 5. Low normal Fick cardiac output/index.   Recommendations: 1. Optimize medical therapy, including addition of isosorbide mononitrate and clopidogrel. 2. Patient should follow-up in 1-2 weeks with Dr. Acie Fredrickson or Truitt Merle to reassess her symptoms.  If she continues to have significant shortness of breath and/or dyspnea, PCI to the proximal/mid LAD with orbital atherectomy should be considered. 3. Aggressive secondary prevention.   Nelva Bush, MD [facility] Pager: [phone]     ASSESSMENT:    1. Coronary artery disease involving native coronary artery of native heart with angina pectoris ([place])   2. NSVT (nonsustained ventricular tachycardia) ([place])   3. Essential hypertension   4. Chest pain, unspecified type   5. Encounter for weight management      PLAN:  In order of problems listed above:  CAD with 70% LAD on cath 12/12/16 with recurrent angina.  Patient has been on Plavix and Imdur for over a month.  She is scheduled for rotational atherectomy 12/16/16 with Dr. Saunders Revel. Will hold janumet 24 hrs before cath and 48 hr after. I have reviewed the risks, indications, and alternatives to angioplasty and stenting with the patient. Risks include but are not limited to bleeding, infection, vascular injury, stroke, myocardial infection, arrhythmia, kidney injury, radiation-related injury in the case of prolonged fluoroscopy use, emergency cardiac surgery, and death. The patient understands the risks of serious complication is low (<2%) and he agrees to proceed.   NSVT on metoprolol  Hypertension controlled  Chest pain and dyspnea on exertion hopefully will improve after atherectomy  Hyperlipidemia on Lipitor, refer to dietitian to help with weight management and diet    Medication Adjustments/Labs and Tests Ordered: Current  medicines are reviewed at length with the patient today.  Concerns regarding medicines are outlined above.   Medication changes, Labs and Tests ordered today are listed in the Patient Instructions below. Patient Instructions  Medication Instructions:  Your physician recommends that you continue on your current medications as directed. Please refer to the Current Medication list given to you today.   Labwork: TODAY:  BMET, CBC, & PT/INR  Testing/Procedures: None ordered  Follow-Up: Your physician recommends that you schedule a follow-up appointment in: 1-2 [place] APP  You have been referred to NUTRITION / WEIGHT LOSS MANAGEMENT.    Any Other Special Instructions Will Be Listed Below (If Applicable).     If you need a refill on your cardiac medications before your next appointment, please call your pharmacy.     Sumner Boast, PA-C  01/12/2017 10:25 AM    [facility] [place], [place], [place]  [id number] Phone: [phone]; Fax: [phone]

## 2017-01-13 ENCOUNTER — Telehealth: Payer: Self-pay | Admitting: Cardiovascular Disease

## 2017-01-13 ENCOUNTER — Telehealth: Payer: Self-pay

## 2017-01-13 LAB — BASIC METABOLIC PANEL
BUN/Creatinine Ratio: 15 (ref 12–28)
BUN: 13 mg/dL (ref 8–27)
CALCIUM: 9.6 mg/dL (ref 8.7–10.3)
CO2: 25 mmol/L (ref 20–29)
CREATININE: 0.85 mg/dL (ref 0.57–1.00)
Chloride: 97 mmol/L (ref 96–106)
GFR calc Af Amer: 81 mL/min/{1.73_m2} (ref >59)
GFR calc non Af Amer: 70 mL/min/{1.73_m2} (ref >59)
GLUCOSE: 112 mg/dL — AB (ref 65–99)
Potassium: 4.7 mmol/L (ref 3.5–5.2)
Sodium: 136 mmol/L (ref 134–144)

## 2017-01-13 LAB — CBC
HEMATOCRIT: 34.4 % (ref 34.0–46.6)
Hemoglobin: 11.4 g/dL (ref 11.1–15.9)
MCH: 28 pg (ref 26.6–33.0)
MCHC: 33.1 g/dL (ref 31.5–35.7)
MCV: 85 fL (ref 79–97)
Platelets: 195 10*3/uL (ref 150–379)
RBC: 4.07 x10E6/uL (ref 3.77–5.28)
RDW: 13.9 % (ref 12.3–15.4)
WBC: 6.3 10*3/uL (ref 3.4–10.8)

## 2017-01-13 LAB — PROTIME-INR
INR: 1.1 (ref 0.8–1.2)
PROTHROMBIN TIME: 11.4 s (ref 9.1–12.0)

## 2017-01-13 NOTE — Telephone Encounter (Signed)
Call received from patient.  Patient given cath instruction see below.    You are scheduled for a coronary arthrectomy on Thursday, December 6 with Dr. Harrell Gave End.  1. Please arrive at the [facility] (Main Entrance A) at [facility]: [place], [place] [address] at 5:30 AM (two hours before your procedure to ensure your preparation). Free valet parking service is available.   Special note: Every effort is made to have your procedure done on time. Please understand that emergencies sometimes delay scheduled procedures.  2. Diet: Do not eat or drink anything after midnight prior to your procedure except sips of water to take medications.  3. Labs: These have been drawn and are WNL.  4. Medication instructions in preparation for your procedure:  Janumet last dose 01/13/2017 PM dose  Am of procedure hold lasix  On the morning of your procedure, take your Aspirin and Plavix and any morning medicines NOT listed above.  You may use sips of water.  5. Plan for one night stay--bring personal belongings. 6. Bring a current list of your medications and current insurance cards. 7. You MUST have a responsible person to drive you home. 8. Someone MUST be with you the first 24 hours after you arrive home or your discharge will be delayed. 9. Please wear clothes that are easy to get on and off and wear slip-on shoes.  Left this nurse name and # if Pt has any further questions

## 2017-01-13 NOTE — Telephone Encounter (Signed)
Left detailed message per DPR:  Patient contacted pre-catheterization at [facility] scheduled for:  01/15/2017 @ 0730 Verified arrival time and place:  NT @ 0530 Confirmed AM meds to be taken pre-cath with sip of water: Take ASA/Plavix Hold lasix Stop metformin last dose 12/4 pm dose  Left this nurse name and # for call back if any further questions

## 2017-01-14 NOTE — Telephone Encounter (Signed)
Discussed lab results 01/12/17 with patient.

## 2017-01-14 NOTE — Telephone Encounter (Signed)
New message   Patient returning call back to nurse  - lab results

## 2017-01-15 ENCOUNTER — Encounter (HOSPITAL_COMMUNITY): Payer: Self-pay | Admitting: General Practice

## 2017-01-15 ENCOUNTER — Ambulatory Visit (HOSPITAL_COMMUNITY)
Admission: RE | Admit: 2017-01-15 | Discharge: 2017-01-16 | Disposition: A | Discharge status: Home / Self Care | Payer: Medicare Other | Source: Ambulatory Visit | Attending: Internal Medicine | Admitting: Internal Medicine

## 2017-01-15 ENCOUNTER — Other Ambulatory Visit: Payer: Self-pay

## 2017-01-15 ENCOUNTER — Ambulatory Visit (HOSPITAL_BASED_OUTPATIENT_CLINIC_OR_DEPARTMENT_OTHER): Payer: Medicare Other

## 2017-01-15 ENCOUNTER — Encounter (HOSPITAL_COMMUNITY)
Admission: RE | Disposition: A | Discharge status: Home / Self Care | Payer: Self-pay | Source: Ambulatory Visit | Attending: Internal Medicine

## 2017-01-15 DIAGNOSIS — M797 Fibromyalgia: Secondary | ICD-10-CM | POA: Diagnosis not present

## 2017-01-15 DIAGNOSIS — I25118 Atherosclerotic heart disease of native coronary artery with other forms of angina pectoris: Secondary | ICD-10-CM | POA: Diagnosis not present

## 2017-01-15 DIAGNOSIS — I10 Essential (primary) hypertension: Secondary | ICD-10-CM | POA: Diagnosis not present

## 2017-01-15 DIAGNOSIS — Z7902 Long term (current) use of antithrombotics/antiplatelets: Secondary | ICD-10-CM | POA: Insufficient documentation

## 2017-01-15 DIAGNOSIS — Z9861 Coronary angioplasty status: Secondary | ICD-10-CM

## 2017-01-15 DIAGNOSIS — Z7982 Long term (current) use of aspirin: Secondary | ICD-10-CM | POA: Diagnosis not present

## 2017-01-15 DIAGNOSIS — I472 Ventricular tachycardia: Secondary | ICD-10-CM | POA: Diagnosis not present

## 2017-01-15 DIAGNOSIS — I209 Angina pectoris, unspecified: Secondary | ICD-10-CM | POA: Diagnosis present

## 2017-01-15 DIAGNOSIS — Z87891 Personal history of nicotine dependence: Secondary | ICD-10-CM | POA: Insufficient documentation

## 2017-01-15 DIAGNOSIS — Z955 Presence of coronary angioplasty implant and graft: Secondary | ICD-10-CM

## 2017-01-15 DIAGNOSIS — D869 Sarcoidosis, unspecified: Secondary | ICD-10-CM | POA: Diagnosis not present

## 2017-01-15 DIAGNOSIS — Z7984 Long term (current) use of oral hypoglycemic drugs: Secondary | ICD-10-CM | POA: Diagnosis not present

## 2017-01-15 DIAGNOSIS — I700 Atherosclerosis of aorta: Secondary | ICD-10-CM | POA: Insufficient documentation

## 2017-01-15 DIAGNOSIS — Z8249 Family history of ischemic heart disease and other diseases of the circulatory system: Secondary | ICD-10-CM | POA: Diagnosis not present

## 2017-01-15 DIAGNOSIS — J439 Emphysema, unspecified: Secondary | ICD-10-CM | POA: Diagnosis not present

## 2017-01-15 DIAGNOSIS — R079 Chest pain, unspecified: Secondary | ICD-10-CM | POA: Diagnosis not present

## 2017-01-15 DIAGNOSIS — E785 Hyperlipidemia, unspecified: Secondary | ICD-10-CM | POA: Insufficient documentation

## 2017-01-15 DIAGNOSIS — E119 Type 2 diabetes mellitus without complications: Secondary | ICD-10-CM | POA: Insufficient documentation

## 2017-01-15 DIAGNOSIS — I2510 Atherosclerotic heart disease of native coronary artery without angina pectoris: Secondary | ICD-10-CM | POA: Diagnosis present

## 2017-01-15 DIAGNOSIS — G4730 Sleep apnea, unspecified: Secondary | ICD-10-CM | POA: Insufficient documentation

## 2017-01-15 DIAGNOSIS — D649 Anemia, unspecified: Secondary | ICD-10-CM | POA: Insufficient documentation

## 2017-01-15 DIAGNOSIS — F419 Anxiety disorder, unspecified: Secondary | ICD-10-CM | POA: Diagnosis not present

## 2017-01-15 DIAGNOSIS — K219 Gastro-esophageal reflux disease without esophagitis: Secondary | ICD-10-CM | POA: Insufficient documentation

## 2017-01-15 DIAGNOSIS — I493 Ventricular premature depolarization: Secondary | ICD-10-CM | POA: Diagnosis not present

## 2017-01-15 DIAGNOSIS — I272 Pulmonary hypertension, unspecified: Secondary | ICD-10-CM | POA: Diagnosis not present

## 2017-01-15 DIAGNOSIS — I25119 Atherosclerotic heart disease of native coronary artery with unspecified angina pectoris: Secondary | ICD-10-CM

## 2017-01-15 DIAGNOSIS — R0602 Shortness of breath: Secondary | ICD-10-CM | POA: Diagnosis present

## 2017-01-15 DIAGNOSIS — Z7951 Long term (current) use of inhaled steroids: Secondary | ICD-10-CM | POA: Diagnosis not present

## 2017-01-15 HISTORY — DX: Obstructive sleep apnea (adult) (pediatric): G47.33

## 2017-01-15 HISTORY — PX: CORONARY ATHERECTOMY: CATH118238

## 2017-01-15 HISTORY — DX: Unspecified asthma, uncomplicated: J45.909

## 2017-01-15 HISTORY — DX: Unspecified chronic bronchitis: J42

## 2017-01-15 HISTORY — DX: Unspecified osteoarthritis, unspecified site: M19.90

## 2017-01-15 HISTORY — DX: Type 2 diabetes mellitus without complications: E11.9

## 2017-01-15 HISTORY — DX: Dependence on other enabling machines and devices: Z99.89

## 2017-01-15 HISTORY — PX: CORONARY BALLOON ANGIOPLASTY: CATH118233

## 2017-01-15 HISTORY — DX: Personal history of other diseases of the digestive system: Z87.19

## 2017-01-15 HISTORY — PX: CORONARY STENT INTERVENTION: CATH118234

## 2017-01-15 HISTORY — PX: CORONARY ANGIOPLASTY WITH STENT PLACEMENT: SHX49

## 2017-01-15 LAB — CREATININE, SERUM: CREATININE: 0.57 mg/dL (ref 0.44–1.00)

## 2017-01-15 LAB — CBC
HCT: 25.7 % — ABNORMAL LOW (ref 36.0–46.0)
HEMATOCRIT: 31.7 % — AB (ref 36.0–46.0)
HEMOGLOBIN: 10.3 g/dL — AB (ref 12.0–15.0)
Hemoglobin: 8.4 g/dL — ABNORMAL LOW (ref 12.0–15.0)
MCH: 28.1 pg (ref 26.0–34.0)
MCH: 27.8 pg (ref 26.0–34.0)
MCHC: 32.7 g/dL (ref 30.0–36.0)
MCHC: 32.5 g/dL (ref 30.0–36.0)
MCV: 86 fL (ref 78.0–100.0)
MCV: 85.4 fL (ref 78.0–100.0)
PLATELETS: 123 10*3/uL — AB (ref 150–400)
Platelets: 153 10*3/uL (ref 150–400)
RBC: 2.99 MIL/uL — ABNORMAL LOW (ref 3.87–5.11)
RBC: 3.71 MIL/uL — AB (ref 3.87–5.11)
RDW: 13.5 % (ref 11.5–15.5)
RDW: 13.5 % (ref 11.5–15.5)
WBC: 4.4 10*3/uL (ref 4.0–10.5)
WBC: 6 10*3/uL (ref 4.0–10.5)

## 2017-01-15 LAB — GLUCOSE, CAPILLARY
GLUCOSE-CAPILLARY: 111 mg/dL — AB (ref 65–99)
GLUCOSE-CAPILLARY: 137 mg/dL — AB (ref 65–99)
Glucose-Capillary: 99 mg/dL (ref 65–99)

## 2017-01-15 LAB — POCT ACTIVATED CLOTTING TIME
ACTIVATED CLOTTING TIME: 274 s
Activated Clotting Time: 285 seconds
Activated Clotting Time: 329 seconds
Activated Clotting Time: 301 seconds
Activated Clotting Time: 318 seconds

## 2017-01-15 LAB — ECHOCARDIOGRAM COMPLETE

## 2017-01-15 SURGERY — CORONARY ATHERECTOMY
Anesthesia: LOCAL

## 2017-01-15 MED ORDER — SODIUM CHLORIDE 0.9% FLUSH
3.0000 mL | Freq: Two times a day (BID) | INTRAVENOUS | Status: DC
Start: 2017-01-15 — End: 2017-01-16
  Administered 2017-01-15 (×2): 3 mL via INTRAVENOUS

## 2017-01-15 MED ORDER — IRBESARTAN 75 MG PO TABS
150.0000 mg | ORAL_TABLET | Freq: Every day | ORAL | Status: DC
  Administered 2017-01-16: 10:00:00 150 mg via ORAL
  Filled 2017-01-15: qty 2

## 2017-01-15 MED ORDER — MIDAZOLAM HCL 2 MG/2ML IJ SOLN
INTRAMUSCULAR | Status: AC
  Filled 2017-01-15: qty 2

## 2017-01-15 MED ORDER — VERAPAMIL HCL 2.5 MG/ML IV SOLN
INTRAVENOUS | Status: DC | PRN
  Administered 2017-01-15: 10 mL via INTRA_ARTERIAL

## 2017-01-15 MED ORDER — BRINZOLAMIDE 1 % OP SUSP
1.0000 [drp] | Freq: Three times a day (TID) | OPHTHALMIC | Status: DC
  Administered 2017-01-15 (×3): 1 [drp] via OPHTHALMIC
  Filled 2017-01-15: qty 10

## 2017-01-15 MED ORDER — ONDANSETRON HCL 4 MG/2ML IJ SOLN: 4.0000 mg | Freq: Four times a day (QID) | INTRAMUSCULAR | Status: DC | PRN

## 2017-01-15 MED ORDER — MONTELUKAST SODIUM 10 MG PO TABS
10.0000 mg | ORAL_TABLET | Freq: Every day | ORAL | Status: DC
  Administered 2017-01-15: 22:00:00 10 mg via ORAL
  Filled 2017-01-15: qty 1

## 2017-01-15 MED ORDER — SODIUM CHLORIDE 0.9% FLUSH: 3.0000 mL | INTRAVENOUS | Status: DC | PRN

## 2017-01-15 MED ORDER — LOTEPREDNOL ETABONATE 0.5 % OP SUSP
3.0000 [drp] | Freq: Every day | OPHTHALMIC | Status: DC
  Administered 2017-01-15: 3 [drp] via OPHTHALMIC
  Filled 2017-01-15: qty 5

## 2017-01-15 MED ORDER — IOPAMIDOL (ISOVUE-370) INJECTION 76%
INTRAVENOUS | Status: DC | PRN
  Administered 2017-01-15: 125 mL via INTRA_ARTERIAL

## 2017-01-15 MED ORDER — INSULIN ASPART 100 UNIT/ML ~~LOC~~ SOLN: 0.0000 [IU] | Freq: Three times a day (TID) | SUBCUTANEOUS | Status: DC

## 2017-01-15 MED ORDER — LOTEPREDNOL ETABONATE 0.5 % OP SUSP: 3.0000 [drp] | OPHTHALMIC | Status: DC

## 2017-01-15 MED ORDER — FENTANYL CITRATE (PF) 100 MCG/2ML IJ SOLN
INTRAMUSCULAR | Status: AC
  Filled 2017-01-15: qty 2

## 2017-01-15 MED ORDER — NITROGLYCERIN 1 MG/10 ML FOR IR/CATH LAB
INTRA_ARTERIAL | Status: AC
  Filled 2017-01-15: qty 10

## 2017-01-15 MED ORDER — SODIUM CHLORIDE 0.9 % WEIGHT BASED INFUSION: 1.0000 mL/kg/h | INTRAVENOUS | Status: DC

## 2017-01-15 MED ORDER — MORPHINE SULFATE (PF) 4 MG/ML IV SOLN
2.0000 mg | Freq: Once | INTRAVENOUS | Status: AC
  Administered 2017-01-15: 2 mg via INTRAVENOUS
  Filled 2017-01-15: qty 1

## 2017-01-15 MED ORDER — IOPAMIDOL (ISOVUE-370) INJECTION 76%
INTRAVENOUS | Status: AC
  Filled 2017-01-15: qty 50

## 2017-01-15 MED ORDER — SODIUM CHLORIDE 0.9 % WEIGHT BASED INFUSION
3.0000 mL/kg/h | INTRAVENOUS | Status: DC
  Administered 2017-01-15: 3 mL/kg/h via INTRAVENOUS

## 2017-01-15 MED ORDER — ATORVASTATIN CALCIUM 20 MG PO TABS
20.0000 mg | ORAL_TABLET | Freq: Every day | ORAL | Status: DC
  Administered 2017-01-15: 20 mg via ORAL
  Filled 2017-01-15: qty 1

## 2017-01-15 MED ORDER — NITROGLYCERIN 0.4 MG SL SUBL
0.4000 mg | SUBLINGUAL_TABLET | SUBLINGUAL | Status: DC | PRN
  Administered 2017-01-15 (×2): 0.4 mg via SUBLINGUAL
  Filled 2017-01-15: qty 1

## 2017-01-15 MED ORDER — CLOPIDOGREL BISULFATE 300 MG PO TABS
ORAL_TABLET | ORAL | Status: AC
  Filled 2017-01-15: qty 1

## 2017-01-15 MED ORDER — HEPARIN (PORCINE) IN NACL 2-0.9 UNIT/ML-% IJ SOLN
INTRAMUSCULAR | Status: AC
  Filled 2017-01-15: qty 1000

## 2017-01-15 MED ORDER — ACETAMINOPHEN 325 MG PO TABS: 650.0000 mg | ORAL_TABLET | ORAL | Status: DC | PRN

## 2017-01-15 MED ORDER — HEPARIN SODIUM (PORCINE) 1000 UNIT/ML IJ SOLN
INTRAMUSCULAR | Status: DC | PRN
  Administered 2017-01-15: 2000 [IU] via INTRAVENOUS
  Administered 2017-01-15: 7000 [IU] via INTRAVENOUS

## 2017-01-15 MED ORDER — MIDAZOLAM HCL 2 MG/2ML IJ SOLN
INTRAMUSCULAR | Status: DC | PRN
  Administered 2017-01-15 (×2): 1 mg via INTRAVENOUS
  Administered 2017-01-15: 0.5 mg via INTRAVENOUS

## 2017-01-15 MED ORDER — LABETALOL HCL 5 MG/ML IV SOLN: 10.0000 mg | INTRAVENOUS | Status: AC | PRN

## 2017-01-15 MED ORDER — GUAIFENESIN 100 MG/5ML PO SOLN: 5.0000 mL | Freq: Four times a day (QID) | ORAL | Status: DC | PRN

## 2017-01-15 MED ORDER — ALBUTEROL SULFATE (2.5 MG/3ML) 0.083% IN NEBU: 2.5000 mg | INHALATION_SOLUTION | Freq: Every day | RESPIRATORY_TRACT | Status: DC | PRN

## 2017-01-15 MED ORDER — HEPARIN (PORCINE) IN NACL 2-0.9 UNIT/ML-% IJ SOLN
INTRAMUSCULAR | Status: AC | PRN
  Administered 2017-01-15: 1500 mL

## 2017-01-15 MED ORDER — ASPIRIN 81 MG PO CHEW: 81.0000 mg | CHEWABLE_TABLET | ORAL | Status: DC

## 2017-01-15 MED ORDER — SODIUM CHLORIDE 0.9 % IV SOLN
250.0000 mL | INTRAVENOUS | Status: DC | PRN
Start: 2017-01-15 — End: 2017-01-15

## 2017-01-15 MED ORDER — CLOPIDOGREL BISULFATE 75 MG PO TABS
75.0000 mg | ORAL_TABLET | Freq: Every day | ORAL | Status: DC
  Administered 2017-01-16: 10:00:00 75 mg via ORAL
  Filled 2017-01-15: qty 1

## 2017-01-15 MED ORDER — HEPARIN SODIUM (PORCINE) 1000 UNIT/ML IJ SOLN
INTRAMUSCULAR | Status: AC
  Filled 2017-01-15: qty 1

## 2017-01-15 MED ORDER — TIMOLOL MALEATE 0.5 % OP SOLN
1.0000 [drp] | Freq: Two times a day (BID) | OPHTHALMIC | Status: DC
  Administered 2017-01-15 (×2): 1 [drp] via OPHTHALMIC
  Filled 2017-01-15: qty 5

## 2017-01-15 MED ORDER — ALUM & MAG HYDROXIDE-SIMETH 200-200-20 MG/5ML PO SUSP
30.0000 mL | ORAL | Status: AC
  Administered 2017-01-15: 17:00:00 30 mL via ORAL
  Filled 2017-01-15: qty 30

## 2017-01-15 MED ORDER — KETOROLAC TROMETHAMINE 0.5 % OP SOLN
1.0000 [drp] | Freq: Four times a day (QID) | OPHTHALMIC | Status: DC
  Administered 2017-01-15 (×2): 1 [drp] via OPHTHALMIC
  Filled 2017-01-15: qty 3

## 2017-01-15 MED ORDER — PERFLUTREN LIPID MICROSPHERE
1.0000 mL | INTRAVENOUS | Status: AC | PRN
  Administered 2017-01-15: 3 mL via INTRAVENOUS
  Filled 2017-01-15: qty 10

## 2017-01-15 MED ORDER — PANTOPRAZOLE SODIUM 40 MG PO TBEC
40.0000 mg | DELAYED_RELEASE_TABLET | Freq: Every day | ORAL | Status: DC
  Administered 2017-01-15 – 2017-01-16 (×2): 40 mg via ORAL
  Filled 2017-01-15 (×2): qty 1

## 2017-01-15 MED ORDER — SODIUM CHLORIDE 0.9% FLUSH: 3.0000 mL | Freq: Two times a day (BID) | INTRAVENOUS | Status: DC

## 2017-01-15 MED ORDER — SODIUM CHLORIDE 0.9 % IV SOLN: INTRAVENOUS | Status: AC

## 2017-01-15 MED ORDER — LATANOPROST 0.005 % OP SOLN
1.0000 [drp] | Freq: Every day | OPHTHALMIC | Status: DC
  Administered 2017-01-15: 1 [drp] via OPHTHALMIC
  Filled 2017-01-15: qty 2.5

## 2017-01-15 MED ORDER — ASPIRIN EC 81 MG PO TBEC
81.0000 mg | DELAYED_RELEASE_TABLET | Freq: Every day | ORAL | Status: DC
  Administered 2017-01-15: 22:00:00 81 mg via ORAL
  Filled 2017-01-15 (×2): qty 1

## 2017-01-15 MED ORDER — ENOXAPARIN SODIUM 40 MG/0.4ML ~~LOC~~ SOLN: 40.0000 mg | SUBCUTANEOUS | Status: DC

## 2017-01-15 MED ORDER — MOMETASONE FURO-FORMOTEROL FUM 200-5 MCG/ACT IN AERO
2.0000 | INHALATION_SPRAY | Freq: Two times a day (BID) | RESPIRATORY_TRACT | Status: DC
  Administered 2017-01-15 – 2017-01-16 (×2): 2 via RESPIRATORY_TRACT
  Filled 2017-01-15: qty 8.8

## 2017-01-15 MED ORDER — LIDOCAINE HCL (PF) 1 % IJ SOLN
INTRAMUSCULAR | Status: AC
  Filled 2017-01-15: qty 30

## 2017-01-15 MED ORDER — TRAVOPROST 0.004 % OP SOLN: 1.0000 [drp] | Freq: Every day | OPHTHALMIC | Status: DC

## 2017-01-15 MED ORDER — FENTANYL CITRATE (PF) 100 MCG/2ML IJ SOLN
INTRAMUSCULAR | Status: DC | PRN
  Administered 2017-01-15 (×3): 50 ug via INTRAVENOUS

## 2017-01-15 MED ORDER — CLOPIDOGREL BISULFATE 300 MG PO TABS
ORAL_TABLET | ORAL | Status: DC | PRN
  Administered 2017-01-15: 300 mg via ORAL

## 2017-01-15 MED ORDER — BRIMONIDINE TARTRATE 0.15 % OP SOLN
1.0000 [drp] | Freq: Three times a day (TID) | OPHTHALMIC | Status: DC
  Administered 2017-01-15 (×3): 1 [drp] via OPHTHALMIC
  Filled 2017-01-15: qty 5

## 2017-01-15 MED ORDER — VERAPAMIL HCL 2.5 MG/ML IV SOLN
INTRAVENOUS | Status: AC
  Filled 2017-01-15: qty 2

## 2017-01-15 MED ORDER — ANGIOPLASTY BOOK
Freq: Once | Status: AC
  Administered 2017-01-16: 05:00:00
  Filled 2017-01-15: qty 1

## 2017-01-15 MED ORDER — LIDOCAINE HCL (PF) 1 % IJ SOLN
INTRAMUSCULAR | Status: DC | PRN
  Administered 2017-01-15: 2 mL

## 2017-01-15 MED ORDER — IOPAMIDOL (ISOVUE-370) INJECTION 76%
INTRAVENOUS | Status: AC
  Filled 2017-01-15: qty 125

## 2017-01-15 MED ORDER — SODIUM CHLORIDE 0.9 % IV SOLN: 250.0000 mL | INTRAVENOUS | Status: DC | PRN

## 2017-01-15 MED ORDER — PREGABALIN 25 MG PO CAPS
75.0000 mg | ORAL_CAPSULE | Freq: Every day | ORAL | Status: DC
  Administered 2017-01-15: 22:00:00 75 mg via ORAL
  Filled 2017-01-15: qty 3

## 2017-01-15 MED ORDER — SODIUM CHLORIDE 0.9 % IV SOLN
INTRAVENOUS | Status: DC | PRN
  Administered 2017-01-15: 08:00:00 via SURGICAL_CAVITY

## 2017-01-15 MED ORDER — NITROGLYCERIN 1 MG/10 ML FOR IR/CATH LAB
INTRA_ARTERIAL | Status: DC | PRN
  Administered 2017-01-15 (×4): 200 ug via INTRACORONARY

## 2017-01-15 MED ORDER — LOTEPREDNOL ETABONATE 0.5 % OP SUSP
4.0000 [drp] | Freq: Every day | OPHTHALMIC | Status: DC
  Administered 2017-01-15: 4 [drp] via OPHTHALMIC

## 2017-01-15 MED ORDER — METOPROLOL TARTRATE 25 MG PO TABS
50.0000 mg | ORAL_TABLET | Freq: Two times a day (BID) | ORAL | Status: DC
  Administered 2017-01-15 – 2017-01-16 (×2): 50 mg via ORAL
  Filled 2017-01-15 (×2): qty 2

## 2017-01-15 MED ORDER — ISOSORBIDE MONONITRATE ER 30 MG PO TB24
30.0000 mg | ORAL_TABLET | Freq: Every day | ORAL | Status: DC
  Administered 2017-01-16: 10:00:00 30 mg via ORAL
  Filled 2017-01-15: qty 1

## 2017-01-15 MED ORDER — ALBUTEROL SULFATE (2.5 MG/3ML) 0.083% IN NEBU: 3.0000 mL | INHALATION_SOLUTION | Freq: Four times a day (QID) | RESPIRATORY_TRACT | Status: DC | PRN

## 2017-01-15 MED ORDER — HYDRALAZINE HCL 20 MG/ML IJ SOLN: 5.0000 mg | INTRAMUSCULAR | Status: AC | PRN

## 2017-01-15 SURGICAL SUPPLY — 27 items
BALLN EMERGE MR 2.75X20 (BALLOONS) ×2
BALLN MINITREK OTW 1.5X6 (BALLOONS) ×2
BALLN SAPPHIRE 2.0X12 (BALLOONS) ×2
BALLN SAPPHIRE ~~LOC~~ 2.0X12 (BALLOONS) ×2 IMPLANT
BALLN SAPPHIRE ~~LOC~~ 3.5X18 (BALLOONS) ×1 IMPLANT
BALLN SAPPHIRE ~~LOC~~ 4.0X12 (BALLOONS) ×1 IMPLANT
BALLOON EMERGE MR 2.75X20 (BALLOONS) IMPLANT
BALLOON MINITREK OTW 1.5X6 (BALLOONS) IMPLANT
BALLOON SAPPHIRE 2.0X12 (BALLOONS) IMPLANT
CATH VISTA GUIDE 6FR XB3 (CATHETERS) ×1 IMPLANT
CROWN DIAMONDBACK CLASSIC 1.25 (BURR) ×1 IMPLANT
DEVICE RAD COMP TR BAND LRG (VASCULAR PRODUCTS) ×1 IMPLANT
GLIDESHEATH SLEND SS 6F .021 (SHEATH) ×1 IMPLANT
GUIDEWIRE INQWIRE 1.5J.035X260 (WIRE) IMPLANT
INQWIRE 1.5J .035X260CM (WIRE) ×2
KIT ENCORE 26 ADVANTAGE (KITS) ×2 IMPLANT
KIT HEART LEFT (KITS) ×2 IMPLANT
LUBRICANT VIPERSLIDE CORONARY (MISCELLANEOUS) ×1 IMPLANT
PACK CARDIAC CATHETERIZATION (CUSTOM PROCEDURE TRAY) ×2 IMPLANT
STENT SIERRA 3.50 X 33 MM (Permanent Stent) ×1 IMPLANT
TRANSDUCER W/STOPCOCK (MISCELLANEOUS) ×2 IMPLANT
TUBING CIL FLEX 10 FLL-RA (TUBING) ×2 IMPLANT
VALVE GUARDIAN II ~~LOC~~ HEMO (MISCELLANEOUS) ×1 IMPLANT
WIRE ASAHI GRAND SLAM 180CM (WIRE) ×1 IMPLANT
WIRE HI TORQ BMW 190CM (WIRE) ×1 IMPLANT
WIRE RUNTHROUGH .014X180CM (WIRE) ×1 IMPLANT
WIRE VIPER ADVANCE COR .012TIP (WIRE) ×1 IMPLANT

## 2017-01-15 NOTE — Progress Notes (Signed)
  Echocardiogram 2D Echocardiogram has been performed.  Sheila Reynolds 01/15/2017, 7:03 PM

## 2017-01-15 NOTE — Interval H&P Note (Signed)
History and Physical Interval Note:  01/15/2017 7:03 AM  Sheila Reynolds  has presented today for cardiac catheterization, with the diagnosis of stable angina. The various methods of treatment have been discussed with the patient and family. After consideration of risks, benefits and other options for treatment, the patient has consented to  Procedure(s): CORONARY ATHERECTOMY (N/A) as a surgical intervention .  The patient's history has been reviewed, patient examined, no change in status, stable for surgery.  I have reviewed the patient's chart and labs.  Questions were answered to the patient's satisfaction.    Cath Lab Visit (complete for each Cath Lab visit)  Clinical Evaluation Leading to the Procedure:   ACS: No.  Non-ACS:    Anginal Classification: CCS III  Anti-ischemic medical therapy: Maximal Therapy (2 or more classes of medications)  Non-Invasive Test Results: Low-risk stress test findings: cardiac mortality <1%/year (resting Pd/Pa of mid LAD 0.79 last month consistent with hemodynamically significant disease)  Prior CABG: No previous CABG  Sheila Reynolds

## 2017-01-15 NOTE — Progress Notes (Signed)
TR BAND REMOVAL  LOCATION: Right  radial  DEFLATED PER PROTOCOL:     TIME BAND OFF / DRESSING APPLIED:   1345   SITE UPON ARRIVAL:    Level 0  SITE AFTER BAND REMOVAL:    Level 0  CIRCULATION SENSATION AND MOVEMENT:    Within Normal Limits : yes  COMMENTS:

## 2017-01-15 NOTE — Brief Op Note (Signed)
Brief Cardiac Catheterization Note  Date: 01/15/2017 Time: 11:34 AM  PATIENT:  Sheila Reynolds  71 y.o. female  PRE-OPERATIVE DIAGNOSIS:  Stable angina  POST-OPERATIVE DIAGNOSIS:  Same  PROCEDURE:  Procedure(s): CORONARY ATHERECTOMY (N/A) CORONARY STENT INTERVENTION (N/A) CORONARY BALLOON ANGIOPLASTY (N/A)  SURGEON:  Surgeon(s) and Role:    * Ashlin Kreps, MD - Primary  PHYSICIAN ASSISTANT:   FINDINGS: 1. 70% proximal/mid LAD stenosis (previously found to be hemodynamically significant with resting Pd/Pa of 0.79). 2. 70% ostial D2 stenosis. 3. Successful orbital atherectomy and PCI using Xience Sierra 3.5 x 33 mm DES with 10% proximal stenosis (post-dilated with 4.0 [place] balloon at high pressure) and TIMI-3 flow. 4. Successful PTCA to ostial D2 (including kissing balloon inflation) with reduction in stenosis to 50% and TIMI-3 flow.  RECOMMENDATIONS: 1. DAPT with ASA and clopidogrel for at least 6 months, ideally longer. 2. Aggressive secondary prevention.  Nelva Bush, MD [facility] Pager: [phone]

## 2017-01-15 NOTE — Progress Notes (Signed)
Patient reported no further pain this evening, Dr. Saunders Revel into see patient after echo completed and reviewed information with patient and husband. Resting quietly at this time with no complaints

## 2017-01-15 NOTE — Progress Notes (Signed)
Patient c/o midsternal chest pain 7/10 described  "more than indigestion pain" and right shoulder to rt arm "achy" pain.Skin warm and dry to touch, no SOB , NTG SL  given , pain level down to 5/10 after 2 doses ,protonix 40mg  po given, Stat EKG done,Dr. End notified and seen patient. Maalox 30 cc given as ordered.kept Monitored , Dr End at bedside, report given to Earlie Lou RN

## 2017-01-15 NOTE — Care Management Note (Signed)
Case Management Note  Patient Details  Name: Sheila Reynolds MRN: [id number] Date of Birth: 01/28/1946  Subjective/Objective:   From home, s/p coronary stent, will be on plavix.                  Action/Plan: NCM will follow for dc needs.   Expected Discharge Date:                  Expected Discharge Plan:  Home/Self Care  In-House Referral:     Discharge planning Services  CM Consult  Post Acute Care Choice:    Choice offered to:     DME Arranged:    DME Agency:     HH Arranged:    [place] Agency:     Status of Service:  Completed, signed off  If discussed at [employer] of Stay Meetings, dates discussed:    Additional Comments:  Zenon Mayo, RN 01/15/2017, 3:19 PM

## 2017-01-15 NOTE — Progress Notes (Signed)
Cardiac Catheterization Post-Procedure Note  Date: 01/15/17 Time: 9:04 PM  Subjective: This afternoon, [name] began complaining of chest and right arm pain. Pain improved slightly with sublingual NTG and pantoprazole. Following IV morphine, she is now chest and arm pain free. She does not have any other complaints.  Objective: Temp:  [97.7 F (36.5 C)-98.4 F (36.9 C)] 98.4 F (36.9 C) (12/06 1915) Pulse Rate:  [57-81] 80 (12/06 2100) Resp:  [6-27] 21 (12/06 2100) BP: (115-170)/(66-90) 147/66 (12/06 2100) SpO2:  [99 %-100 %] 99 % (12/06 2100)  Gen: NAD Lungs: CTAB Heart: RRR without murmurs Ext: Right arm without hematoma. Mild bruising noted at the wrist and medial upper arm.  EKG: NSR without abnormalities.  Echo: Normal LVEF without WMA. Trivial posterior pericardial effusion.  Labs notable for hemoglobin 11.4 -> 8.4  Assessment/plan: Patient experienced chest pain and right arm pain several hours after completion of complex PCI to the proximal/mid LAD and D2. Pain has since resolved with conservative measures. EKG and echo are unremarkable. We will continue close monitoring overnight. 3 gm drop in hemoglobin noted, though all cell lines seem to have decreased (? Dilution). We will repeat a CBC tonight to ensure that his has not fallen further, though I see no signs of active bleeding.  Nelva Bush, MD [facility] Pager: [phone]

## 2017-01-16 ENCOUNTER — Encounter (HOSPITAL_COMMUNITY): Payer: Self-pay | Admitting: Internal Medicine

## 2017-01-16 DIAGNOSIS — G4730 Sleep apnea, unspecified: Secondary | ICD-10-CM | POA: Diagnosis not present

## 2017-01-16 DIAGNOSIS — I209 Angina pectoris, unspecified: Secondary | ICD-10-CM | POA: Diagnosis not present

## 2017-01-16 DIAGNOSIS — I10 Essential (primary) hypertension: Secondary | ICD-10-CM | POA: Diagnosis not present

## 2017-01-16 DIAGNOSIS — R0602 Shortness of breath: Secondary | ICD-10-CM | POA: Diagnosis not present

## 2017-01-16 DIAGNOSIS — I25118 Atherosclerotic heart disease of native coronary artery with other forms of angina pectoris: Secondary | ICD-10-CM | POA: Diagnosis not present

## 2017-01-16 DIAGNOSIS — K219 Gastro-esophageal reflux disease without esophagitis: Secondary | ICD-10-CM | POA: Diagnosis not present

## 2017-01-16 DIAGNOSIS — F419 Anxiety disorder, unspecified: Secondary | ICD-10-CM | POA: Diagnosis not present

## 2017-01-16 DIAGNOSIS — D869 Sarcoidosis, unspecified: Secondary | ICD-10-CM | POA: Diagnosis not present

## 2017-01-16 DIAGNOSIS — I472 Ventricular tachycardia: Secondary | ICD-10-CM | POA: Diagnosis not present

## 2017-01-16 DIAGNOSIS — I25119 Atherosclerotic heart disease of native coronary artery with unspecified angina pectoris: Secondary | ICD-10-CM

## 2017-01-16 DIAGNOSIS — D649 Anemia, unspecified: Secondary | ICD-10-CM | POA: Diagnosis not present

## 2017-01-16 DIAGNOSIS — I493 Ventricular premature depolarization: Secondary | ICD-10-CM | POA: Diagnosis not present

## 2017-01-16 DIAGNOSIS — M797 Fibromyalgia: Secondary | ICD-10-CM | POA: Diagnosis not present

## 2017-01-16 DIAGNOSIS — E785 Hyperlipidemia, unspecified: Secondary | ICD-10-CM | POA: Diagnosis not present

## 2017-01-16 DIAGNOSIS — E119 Type 2 diabetes mellitus without complications: Secondary | ICD-10-CM | POA: Diagnosis not present

## 2017-01-16 LAB — GLUCOSE, CAPILLARY: GLUCOSE-CAPILLARY: 111 mg/dL — AB (ref 65–99)

## 2017-01-16 LAB — CBC
HCT: 33.6 % — ABNORMAL LOW (ref 36.0–46.0)
Hemoglobin: 11.2 g/dL — ABNORMAL LOW (ref 12.0–15.0)
MCH: 28.7 pg (ref 26.0–34.0)
MCHC: 33.3 g/dL (ref 30.0–36.0)
MCV: 86.2 fL (ref 78.0–100.0)
PLATELETS: 163 10*3/uL (ref 150–400)
RBC: 3.9 MIL/uL (ref 3.87–5.11)
RDW: 13.8 % (ref 11.5–15.5)
WBC: 6.3 10*3/uL (ref 4.0–10.5)

## 2017-01-16 LAB — BASIC METABOLIC PANEL
ANION GAP: 8 (ref 5–15)
BUN: 10 mg/dL (ref 6–20)
CO2: 24 mmol/L (ref 22–32)
CREATININE: 0.65 mg/dL (ref 0.44–1.00)
Calcium: 8.7 mg/dL — ABNORMAL LOW (ref 8.9–10.3)
Chloride: 104 mmol/L (ref 101–111)
Glucose, Bld: 118 mg/dL — ABNORMAL HIGH (ref 65–99)
Potassium: 4.1 mmol/L (ref 3.5–5.1)
SODIUM: 136 mmol/L (ref 135–145)

## 2017-01-16 MED ORDER — ATORVASTATIN CALCIUM 40 MG PO TABS: 40.0000 mg | ORAL_TABLET | Freq: Every day | ORAL | 6 refills | Status: DC

## 2017-01-16 MED ORDER — ATORVASTATIN CALCIUM 40 MG PO TABS: 40.0000 mg | ORAL_TABLET | Freq: Every day | ORAL | Status: DC

## 2017-01-16 MED ORDER — PANTOPRAZOLE SODIUM 40 MG PO TBEC
40.0000 mg | DELAYED_RELEASE_TABLET | Freq: Every day | ORAL | 6 refills | Status: DC
Start: 2017-01-16 — End: 2017-08-19

## 2017-01-16 NOTE — Discharge Summary (Signed)
Discharge Summary    Patient ID: Sheila Reynolds,  MRN: [id number], DOB/AGE: [birth year] 71 y.o.  Admit date: 01/15/2017 Discharge date: 01/16/2017  Primary Care Provider: [place] Chard Primary Cardiologist: Dr. Acie Fredrickson  Discharge Diagnoses    Principal Problem:   Shortness of breath Active Problems:   CAD (coronary artery disease)   Angina pectoris (HCC)   HLD  HTN  Post op anemia -> resolved  Allergies Allergies  Allergen Reactions  . Crestor [Rosuvastatin Calcium] Other (See Comments)    muscle aches  . Shellfish Allergy Itching and Other (See Comments)    Crab, shrimp and lobster ---lips itch and tingle  . Demerol [Meperidine] Other (See Comments)    Hallucinations     Diagnostic Studies/Procedures   CORONARY BALLOON ANGIOPLASTY  CORONARY STENT INTERVENTION  CORONARY ATHERECTOMY  Conclusion   Conclusions: 1. 70% proximal/mid LAD stenosis (previously found to be hemodynamically significant with resting Pd/Pa of 0.79). 2. 70% ostial D2 stenosis. 3. Successful orbital atherectomy and PCI using Xience Sierra 3.5 x 33 mm DES with 10% proximal stenosis (post-dilated with 4.0 [place] balloon at high pressure) and TIMI-3 flow. 4. Successful PTCA to ostial D2 (including kissing balloon inflation) with reduction in stenosis to 50% and TIMI-3 flow.  Recommendations: 1. Dual antiplatelet therapy with aspirin and clopidogrel for at least 6 months, ideally longer. 2. Aggressive secondary prevention. 3. Overnight observation and cardiac rehab after discharge.  Nelva Bush, MD [facility] Pager: [phone]     Diagnostic Diagram       Post-Intervention Diagram         Echo 12/16/16 Study Conclusions  - Left ventricle: The cavity size was normal. Wall thickness was increased in a pattern of mild LVH. Systolic function was normal. The estimated ejection fraction was in the range of 60% to 65%. Wall motion was normal; there were no  regional wall motion abnormalities. Doppler parameters are consistent with abnormal left ventricular relaxation (grade 1 diastolic dysfunction). - Aortic valve: Mildly calcified annulus. Trileaflet; mildly thickened leaflets. - Right ventricle: The cavity size was normal. Systolic function was normal. - Pericardium, extracardiac: A trivial pericardial effusion was identified posterior to the heart.    History of Present Illness     71 y.o. female with hx of NSVT, sarcoidosis, HTD who recently underwent cath 12/12/16 for DOE and chest pain revealing 70% LAD. She was started on isosorbide and Plavix. She saw Dr. Acie Fredrickson 12/22/16 and was not feeling any better. He discussed her with Dr. Phineas Semen a rotational atherectomy when the atherectomy representative will be available December 6.   She has a history of known moderate CAD per remote cath in 2009.   Hospital Course     Consultants: Dr. Acie Fredrickson  1. CAD - S/p successful orbital atherectomy and PCI using Xience Sierra 3.5 x 33 mm DES with 10% proximal LAD stenosis (post-dilated with 4.0 [place] balloon at high pressure) and TIMI-3 flow and successful PTCA to ostial D2 (including kissing balloon inflation) with reduction in stenosis to 50% and TIMI-3 flow.  - Post cath had chest pain for about 30 minutes --> resolved with nitro and IV morphine. Echo showed normal LVEF with grade 1 DD. No recurrent chest pain. Hgb and Scr stable.  - EKG reassuring this morning. Ambulate.  - Continue ASA, plavix, BB, ARB and Imdur.   2. HLD - 05/07/2016: Cholesterol, Total 133; HDL 50; LDL Calculated 67; Triglycerides 80 - Will increase lipitor to 40mg  qd (prior intolerance to Crestor).  Patient agreed. Monitor symptoms closely.   3. Post op anemia - Resolved.   4. HTN - Elevated this morning. Only received BB yesterday. Will resume home meds. Follow closely as outpatient.   The patient has been seen by Dr. Martinique today and deemed  ready for discharge home. All follow-up appointments have been scheduled. Discharge medications are listed below.   Discharge Vitals Blood pressure (!) 162/67, pulse 68, temperature 98.3 F (36.8 C), temperature source Oral, resp. rate 17, weight 165 lb 5.5 oz (75 kg), SpO2 100 %.  Filed Weights   01/16/17 0430  Weight: 165 lb 5.5 oz (75 kg)    Labs & Radiologic Studies     CBC Recent Labs    01/15/17 2101 01/16/17 0416  WBC 6.0 6.3  HGB 10.3* 11.2*  HCT 31.7* 33.6*  MCV 85.4 86.2  PLT 153 009   Basic Metabolic Panel Recent Labs    01/15/17 1457 01/16/17 0416  NA  --  136  K  --  4.1  CL  --  104  CO2  --  24  GLUCOSE  --  118*  BUN  --  10  CREATININE 0.57 0.65  CALCIUM  --  8.7*    Disposition   Pt is being discharged home today in good condition.  Follow-up Plans & Appointments    Follow-up Information    [name], Wonda Cheng, MD. Go on 01/29/2017.   Specialty:  Cardiology Why:  @2 :40pm for follow up Contact information: [address] [place] [address] [phone]          Discharge Instructions    Amb Referral to Cardiac Rehabilitation   Complete by:  As directed    Diagnosis:   Coronary Stents PTCA     Diet - low sodium heart healthy   Complete by:  As directed    Discharge instructions   Complete by:  As directed    No driving for 48 hours. No lifting over 5 lbs for 1 week. No sexual activity for 1 week. You may return to work on 01/22/17. Keep procedure site clean & dry. If you notice increased pain, swelling, bleeding or pus, call/return!  You may shower, but no soaking baths/hot tubs/pools for 1 week.   Some studies suggest Prilosec/Omeprazole interacts with Plavix. We changed your Prilosec/Omeprazole to Protonix for less chance of interaction.   Hold metformin today--> resume tomorrow evening   Increase activity slowly   Complete by:  As directed       Discharge Medications   Allergies as of 01/16/2017       Reactions   Crestor [rosuvastatin Calcium] Other (See Comments)   muscle aches   Shellfish Allergy Itching, Other (See Comments)   Crab, shrimp and lobster ---lips itch and tingle   Demerol [meperidine] Other (See Comments)   Hallucinations       Medication List    STOP taking these medications   esomeprazole 20 MG capsule Commonly known as:  Kinta Replaced by:  pantoprazole 40 MG tablet     TAKE these medications   acetaminophen 500 MG tablet Commonly known as:  TYLENOL Take 1,000 mg by mouth 2 (two) times daily as needed for moderate pain or headache.   aspirin EC 81 MG tablet Take 81 mg by mouth at bedtime.   atorvastatin 40 MG tablet Commonly known as:  LIPITOR Take 1 tablet (40 mg total) by mouth daily. What changed:    medication strength  how much  to take   benzonatate 200 MG capsule Commonly known as:  TESSALON Take 1 capsule (200 mg total) by mouth 3 (three) times daily as needed for cough.   BIOFREEZE EX Apply 1 application topically daily as needed (foot pain).   brimonidine 0.1 % Soln Commonly known as:  ALPHAGAN P Place 1 drop into the left eye 3 (three) times daily.   brinzolamide 1 % ophthalmic suspension Commonly known as:  AZOPT Place 1 drop into the left eye 3 (three) times daily.   budesonide-formoterol 160-4.5 MCG/ACT inhaler Commonly known as:  SYMBICORT Inhale 2 puffs into the lungs 2 (two) times daily.   clopidogrel 75 MG tablet Commonly known as:  PLAVIX Take 1 tablet (75 mg total) by mouth daily.   furosemide 20 MG tablet Commonly known as:  LASIX Take 1 tablet (20 mg total) by mouth every 3 (three) days.   guaiFENesin 100 MG/5ML Soln Commonly known as:  ROBITUSSIN Take 5 mLs by mouth every 6 (six) hours as needed for cough or to loosen phlegm.   isosorbide mononitrate 30 MG 24 hr tablet Commonly known as:  IMDUR Take 1 tablet (30 mg total) by mouth daily.   ketorolac 0.5 % ophthalmic solution Commonly known as:   ACULAR Place 1 drop into the left eye 4 (four) times daily.   loteprednol 0.5 % ophthalmic suspension Commonly known as:  LOTEMAX Place 3-4 drops into both eyes See admin instructions. Instill 4 drops into the left eye once daily, and 3 drops in the right eye once daily   metoprolol tartrate 50 MG tablet Commonly known as:  LOPRESSOR Take 50 mg by mouth 2 (two) times daily.   montelukast 10 MG tablet Commonly known as:  SINGULAIR Take 10 mg by mouth at bedtime.   naproxen sodium 220 MG tablet Commonly known as:  ALEVE Take 440 mg by mouth daily as needed.   nitroGLYCERIN 0.4 MG SL tablet Commonly known as:  NITROSTAT Place 1 tablet (0.4 mg total) under the tongue every 5 (five) minutes as needed for chest pain.   olmesartan 20 MG tablet Commonly known as:  BENICAR Take 20 mg by mouth daily.   pantoprazole 40 MG tablet Commonly known as:  PROTONIX Take 1 tablet (40 mg total) by mouth daily. Replaces:  esomeprazole 20 MG capsule   pregabalin 75 MG capsule Commonly known as:  LYRICA Take 75 mg by mouth at bedtime.   albuterol (2.5 MG/3ML) 0.083% nebulizer solution Commonly known as:  PROVENTIL Take 2.5 mg by nebulization daily as needed for wheezing or shortness of breath.   PROAIR HFA 108 (90 Base) MCG/ACT inhaler Generic drug:  albuterol Inhale 2 puffs into the lungs every 6 (six) hours as needed for wheezing.   sitaGLIPtin-metformin 50-500 MG tablet Commonly known as:  JANUMET Take 1 tablet by mouth as directed. Take 1 tablet twice daily on Monday through Friday, take 1 tablet daily on Saturdays and Sundays   timolol 0.5 % ophthalmic solution Commonly known as:  BETIMOL Place 1 drop into the left eye 2 (two) times daily.   tolnaftate 1 % cream Commonly known as:  TINACTIN Apply 1 application topically daily as needed (foot itching).   travoprost (benzalkonium) 0.004 % ophthalmic solution Commonly known as:  TRAVATAN Place 1 drop into both eyes at bedtime.    Turmeric 500 MG Tabs Take 500 mg by mouth daily.        Outstanding Labs/Studies   LFT and lipid panel in 6 weeks  Duration of Discharge Encounter   Greater than 30 minutes including physician time.  Signed, Crista Luria Jerren Flinchbaugh PA-C 01/16/2017, 9:14 AM

## 2017-01-16 NOTE — Progress Notes (Signed)
CARDIAC REHAB PHASE I   PRE:  Rate/Rhythm: 73 SR  BP:  Sitting: 162/67        SaO2: 99 RA  MODE:  Ambulation: 500 ft   POST:  Rate/Rhythm: 80 SR  BP:  Sitting: 170/72         SaO2: 100 RA  Pt ambulated 500 ft on RA, handheld assist, steady gait, tolerated well with no complaints. Completed PCI/stent education with pt and husband at bedside.  Reviewed risk factors, anti-platelet therapy, stent card, activity restrictions, ntg, exercise, heart healthy and diabetes diet handouts and phase 2 cardiac rehab. Pt verbalized understanding. Pt agrees to phase 2 cardiac rehab referral, will send to [facility]. Pt to edge of bed per pt request after walk, call bell within reach.   [phone] Lenna Sciara, RN, BSN 01/16/2017 8:53 AM

## 2017-01-16 NOTE — Progress Notes (Signed)
Progress Note  Patient Name: Sheila Reynolds Date of Encounter: 01/16/2017  Primary Cardiologist: Dr. Acie Fredrickson  Subjective   Feeling well. No chest pain, sob or palpitations.   Inpatient Medications    Scheduled Meds: . aspirin EC  81 mg Oral QHS  . atorvastatin  20 mg Oral QHS  . brimonidine  1 drop Left Eye TID  . brinzolamide  1 drop Left Eye TID  . clopidogrel  75 mg Oral Daily  . enoxaparin (LOVENOX) injection  40 mg Subcutaneous Q24H  . insulin aspart  0-15 Units Subcutaneous TID WC  . irbesartan  150 mg Oral Daily  . isosorbide mononitrate  30 mg Oral Daily  . ketorolac  1 drop Left Eye QID  . latanoprost  1 drop Both Eyes QHS  . loteprednol  3 drop Right Eye Daily   And  . loteprednol  4 drop Left Eye Daily  . metoprolol tartrate  50 mg Oral BID  . mometasone-formoterol  2 puff Inhalation BID  . montelukast  10 mg Oral QHS  . pantoprazole  40 mg Oral Daily  . pregabalin  75 mg Oral QHS  . sodium chloride flush  3 mL Intravenous Q12H  . timolol  1 drop Left Eye BID   Continuous Infusions: . sodium chloride     PRN Meds: sodium chloride, acetaminophen, albuterol, albuterol, guaiFENesin, nitroGLYCERIN, ondansetron (ZOFRAN) IV, sodium chloride flush   Vital Signs    Vitals:   01/15/17 2317 01/16/17 0002 01/16/17 0430 01/16/17 0540  BP:   (!) 194/75 (!) 166/77  Pulse:  78 67   Resp: 18 18 15  (!) 22  Temp:   98.3 F (36.8 C)   TempSrc:   Oral   SpO2:  99% 98%   Weight:   165 lb 5.5 oz (75 kg)     Intake/Output Summary (Last 24 hours) at 01/16/2017 0716 Last data filed at 01/16/2017 0443 Gross per 24 hour  Intake 880 ml  Output 1000 ml  Net -120 ml   Filed Weights   01/16/17 0430  Weight: 165 lb 5.5 oz (75 kg)    Telemetry    NSR- Personally Reviewed  ECG    NSR - Personally Reviewed  Physical Exam   GEN: No acute distress.   Neck: No JVD Cardiac: RRR, no murmurs, rubs, or gallops. R radial cath site has ACE wrap overnight to prevent  hand movement.  Respiratory: Clear to auscultation bilaterally. GI: Soft, nontender, non-distended  MS: No edema; No deformity. Neuro:  Nonfocal  Psych: Normal affect   Labs    Chemistry Recent Labs  Lab 01/12/17 1058 01/15/17 1457 01/16/17 0416  NA 136  --  136  K 4.7  --  4.1  CL 97  --  104  CO2 25  --  24  GLUCOSE 112*  --  118*  BUN 13  --  10  CREATININE 0.85 0.57 0.65  CALCIUM 9.6  --  8.7*  GFRNONAA 70 >60 >60  GFRAA 81 >60 >60  ANIONGAP  --   --  8     Hematology Recent Labs  Lab 01/15/17 1457 01/15/17 2101 01/16/17 0416  WBC 4.4 6.0 6.3  RBC 2.99* 3.71* 3.90  HGB 8.4* 10.3* 11.2*  HCT 25.7* 31.7* 33.6*  MCV 86.0 85.4 86.2  MCH 28.1 27.8 28.7  MCHC 32.7 32.5 33.3  RDW 13.5 13.5 13.8  PLT 123* 153 163    Radiology    No results found.  Cardiac Studies    CORONARY BALLOON ANGIOPLASTY  CORONARY STENT INTERVENTION  CORONARY ATHERECTOMY  Conclusion   Conclusions: 1. 70% proximal/mid LAD stenosis (previously found to be hemodynamically significant with resting Pd/Pa of 0.79). 2. 70% ostial D2 stenosis. 3. Successful orbital atherectomy and PCI using Xience Sierra 3.5 x 33 mm DES with 10% proximal stenosis (post-dilated with 4.0 [place] balloon at high pressure) and TIMI-3 flow. 4. Successful PTCA to ostial D2 (including kissing balloon inflation) with reduction in stenosis to 50% and TIMI-3 flow.  Recommendations: 1. Dual antiplatelet therapy with aspirin and clopidogrel for at least 6 months, ideally longer. 2. Aggressive secondary prevention. 3. Overnight observation and cardiac rehab after discharge.  Nelva Bush, MD [facility] Pager: [phone]     Diagnostic Diagram       Post-Intervention Diagram         Echo 12/16/16 Study Conclusions  - Left ventricle: The cavity size was normal. Wall thickness was   increased in a pattern of mild LVH. Systolic function was normal.   The estimated ejection fraction was in  the range of 60% to 65%.   Wall motion was normal; there were no regional wall motion   abnormalities. Doppler parameters are consistent with abnormal   left ventricular relaxation (grade 1 diastolic dysfunction). - Aortic valve: Mildly calcified annulus. Trileaflet; mildly   thickened leaflets. - Right ventricle: The cavity size was normal. Systolic function   was normal. - Pericardium, extracardiac: A trivial pericardial effusion was   identified posterior to the heart.     Patient Profile     71 y.o. female with hx of NSVT, sarcoidosis, HTD who recently underwent cath 12/12/16 for DOE and chest pain revealing 70% LAD.  She was started on isosorbide and Plavix.  She saw Dr. Acie Fredrickson 12/22/16 and was not feeling any better.  He discussed her with Dr. Saunders Revel -->scheduling a rotational atherectomy when the atherectomy representative will be available December 6.   She has a history of known moderate CAD per remote cath in 2009.   Assessment & Plan    1. CAD - S/p successful orbital atherectomy and PCI using Xience Sierra 3.5 x 33 mm DES with 10% proximal LAD stenosis (post-dilated with 4.0 [place] balloon at high pressure) and TIMI-3 flow and successful PTCA to ostial D2 (including kissing balloon inflation) with reduction in stenosis to 50% and TIMI-3 flow.  - Post cath had chest pain --> resolved with nitro and IV morphine. Echo showed normal LVEF with grade 1 DD.  - No recurrent chest pain. Hgb and Scr stable.  - EKG reassuring this morning. Ambulate.  - Continue ASA, plavix, BB, ARB and Imdur.   2. HLD - 05/07/2016: Cholesterol, Total 133; HDL 50; LDL Calculated 67; Triglycerides 80 - Will increase lipitor to 40mg  qd (prior intolerance to Crestor). Patient agreed. Monitor symptoms closely.   3. Post op anemia - Resolved.   For questions or updates, please contact [place] Please consult [email or web address] for contact info under Cardiology/STEMI.      Jarrett Soho, PA    01/16/2017, 7:16 AM    Patient seen and examined and history reviewed. Agree with above findings and plan. Patient had some chest pain for about 30 minutes post procedure and then resolved. Feels fine today. BP elevated today but looks like she only received her metoprolol last night. BP in office was normal. Will make sure she gets all her BP meds this am including ARB, metoprolol, nitrate and lasix.  Ecg is normal. Labs ok. She is stable for DC today.  Peter Martinique, [place] 01/16/2017 8:43 AM

## 2017-01-20 ENCOUNTER — Telehealth (HOSPITAL_COMMUNITY): Payer: Self-pay

## 2017-01-20 NOTE — Telephone Encounter (Signed)
Patients insurance is active and benefits verified through Medicare Part A & B - No co-pay, deductible amount of $183.00/$183.00 has been met, 20% co-insurance, and no pre-authorization is required. Passport/reference [phone]  Patients insurance is active and benefits verified through [place] - No co-pay, no deductible, no out of pocket, no co-insurance, and no pre-authorization is required. Passport/reference [phone]  Patient will be contacted and scheduled after review by the RN Navigator.

## 2017-01-21 ENCOUNTER — Telehealth (HOSPITAL_COMMUNITY): Payer: Self-pay

## 2017-01-21 NOTE — Telephone Encounter (Signed)
Called and spoke with patient in regards to Cardiac Rehab - Patient is interested in program. Scheduled orientation on 02/24/2017 at 8:15am. Patient will attend the 11:15am exc class.

## 2017-01-23 ENCOUNTER — Encounter: Payer: Self-pay | Admitting: *Deleted

## 2017-01-27 ENCOUNTER — Encounter: Payer: Medicare Other | Attending: Internal Medicine | Admitting: Registered"

## 2017-01-27 DIAGNOSIS — E119 Type 2 diabetes mellitus without complications: Secondary | ICD-10-CM

## 2017-01-27 DIAGNOSIS — E669 Obesity, unspecified: Secondary | ICD-10-CM | POA: Insufficient documentation

## 2017-01-27 DIAGNOSIS — Z713 Dietary counseling and surveillance: Secondary | ICD-10-CM | POA: Insufficient documentation

## 2017-01-27 NOTE — Patient Instructions (Signed)
Plan:  Aim for 2-3 Carb Choices per meal (30-45 grams) +/- 1 either way  Aim for 0-1 Carb Choices per snack if hungry (0-15 grams) Include protein with your meals and snacks Aim for 3 balanced meals per day,  with not to heavy of a supper meal and not too late Consider reading food labels for Total Carbohydrate and Sat Fat Grams of foods Consider increasing your activity level daily as tolerated Consider checking blood sugar at alternate times per day as directed by MD Continue taking medication  as directed by MD Adequate and restful sleep is important for your health and controlling blood sugar

## 2017-01-27 NOTE — Progress Notes (Signed)
Diabetes Self-Management Education  Visit Type: First/Initial  Appt. Start Time: 0940 Appt. End Time: 1100  01/28/2017  [name], identified by name and date of birth, is a 71 y.o. female with a diagnosis of Diabetes: Type 2.   ASSESSMENT Patient is s/p heart surgery (December). Patient states she has cut back on many of the foods she enjoys such as pasta and potatoes. Pt states she does not like to exercise, but her husband has been encouraging her to walk. Pt states in Jan she starts cardiac rehab and will be getting exercise 3x week.  Pt reports eating out frequently for meals and snack. Pt states she feels deprived when rest of family is eating foods she is limiting such as pizza.  RD encouraged patient to return for further education on types of fat, low-sodium diet, and possibly learning carb counting.  Diabetes Self-Management Education - 01/27/17 0954      Visit Information   Visit Type  First/Initial      Initial Visit   Diabetes Type  Type 2    Are you currently following a meal plan?  No    Are you taking your medications as prescribed?  Yes Janument    Date Diagnosed  2009      Health Coping   How would you rate your overall health?  Fair      Psychosocial Assessment   Patient Belief/Attitude about Diabetes  Other (comment) unhappy - disgusted    Other persons present  Spouse/SO    How often do you need to have someone help you when you read instructions, pamphlets, or other written materials from your doctor or pharmacy?  1 - Never    What is the last grade level you completed in school?  4+ yrs college      Complications   Last HgB A1C per patient/outside source  5.7 % per pt, not sure    How often do you check your blood sugar?  1-2 times/day      Dietary Intake   Breakfast  cereal, OR bacon eggs, pancakes, hashbrown OR sausage biscuit    Snack (morning)  none    Lunch  salad, chicken OR sandwich, chips OR hotdog, fries OR meat, veggies    Snack  (afternoon)  none    Dinner  salad, chicken, strawberries, blueberries (chick fil-a), crackers, reg 12 oz soda, water    Snack (evening)  none OR PB crackers OR popcorn    Beverage(s)  coffee cream and sugar or splenda, OJ occassionally, water, 12 oz daily      Exercise   Exercise Type  ADL's      Patient Education   Previous Diabetes Education  No    Nutrition management   Role of diet in the treatment of diabetes and the relationship between the three main macronutrients and blood glucose level    Physical activity and exercise   Role of exercise on diabetes management, blood pressure control and cardiac health.    Monitoring  Identified appropriate SMBG and/or A1C goals.      Individualized Goals (developed by patient)   Nutrition  General guidelines for healthy choices and portions discussed    Physical Activity  Exercise 3-5 times per week      Outcomes   Expected Outcomes  Demonstrated interest in learning. Expect positive outcomes    Future DMSE  4-6 wks    Program Status  Not Completed     Individualized Plan for Diabetes Self-Management  Training:   Learning Objective:  Patient will have a greater understanding of diabetes self-management. Patient education plan is to attend individual and/or group sessions per assessed needs and concerns.   Patient Instructions  Plan:  Aim for 2-3 Carb Choices per meal (30-45 grams) +/- 1 either way  Aim for 0-1 Carb Choices per snack if hungry (0-15 grams) Include protein with your meals and snacks Aim for 3 balanced meals per day,  with not to heavy of a supper meal and not too late Consider reading food labels for Total Carbohydrate and Sat Fat Grams of foods Consider increasing your activity level daily as tolerated Consider checking blood sugar at alternate times per day as directed by MD Continue taking medication  as directed by MD Adequate and restful sleep is important for your health and controlling blood sugar  Expected  Outcomes:  Demonstrated interest in learning. Expect positive outcomes  Education material provided: A1C conversion sheet, My Plate, Snack sheet and Support group flyer, 5 tips for supporting people with diabetes (for husband).  If problems or questions, patient to contact team via:  Phone  Future DSME appointment: 4-6 wks

## 2017-01-28 ENCOUNTER — Ambulatory Visit: Payer: Medicare Other | Admitting: Cardiovascular Disease

## 2017-01-28 DIAGNOSIS — E119 Type 2 diabetes mellitus without complications: Secondary | ICD-10-CM | POA: Insufficient documentation

## 2017-01-29 ENCOUNTER — Ambulatory Visit (INDEPENDENT_AMBULATORY_CARE_PROVIDER_SITE_OTHER): Payer: Medicare Other | Admitting: Cardiovascular Disease

## 2017-01-29 ENCOUNTER — Encounter: Payer: Self-pay | Admitting: Cardiovascular Disease

## 2017-01-29 VITALS — BP 136/80 | HR 73 | Ht 62.0 in | Wt 167.8 lb

## 2017-01-29 DIAGNOSIS — I259 Chronic ischemic heart disease, unspecified: Secondary | ICD-10-CM

## 2017-01-29 DIAGNOSIS — I25119 Atherosclerotic heart disease of native coronary artery with unspecified angina pectoris: Secondary | ICD-10-CM | POA: Diagnosis not present

## 2017-01-29 DIAGNOSIS — E782 Mixed hyperlipidemia: Secondary | ICD-10-CM

## 2017-01-29 DIAGNOSIS — I209 Angina pectoris, unspecified: Secondary | ICD-10-CM | POA: Diagnosis not present

## 2017-01-29 NOTE — Progress Notes (Signed)
Cardiology Office Note   Date:  01/29/2017   ID:  Sheila, Reynolds 1946-01-23, MRN [id number]  PCP:  Glendale Chard, MD  Cardiologist:   Mertie Moores, MD   No chief complaint on file.  1. Hyperlipidemia 2. Chest pain 3. Nonsustained ventricular tachycardia 4. Mild to moderate coronary artery disease by cath in 2009. 5. 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 Darrick Penna is a 71 y.o. female with the above noted hx. She has continued to have some palpitations. She has occasional episodes of lightheadedness when she has palpitations. Her main issue has been lots of total body cramps. She has been seen in the emergency room. Her lab work looked fine.  She's had lots of dyspnea especially with exertion. She has a history of sarcoidosis and is followed by Dr. Annamaria Boots.  March 11, 2012: Sheila Reynolds has had more palpitations recently. It Appears that her metoprolol dose was decreased slightly. We placed an event monitor on her. She did not have any episodes of atrial fibrillation. She has occasional drinker atrial contractions and occasional premature ventricular contractions. There were no life-threatening arrhythmias.  She is doing well from a cardiac standpoint. She's not had any episodes of syncope. She has significant shortness of breath especially when climbing stairs. This is likely due to her sarcoidosis. She denies any chest pain. Her BP is a bit elevated. She avoids salt.   May 02, 2013 :  She presented to see Richardson Dopp in February with some increased palpitations and lightheadedness. He recommended that she decrease her caffeine intake. He placed a 21 day event monitor and instructed her to take an extra metoprolol as needed.  The palpitations can occur at any time. They occur with rest and with exertion. They're not constant. The last 30-45 seconds and occur multiple times through the day.  Sept. 16, 2015:  Still having palpitations. She has had  more leg edema and her medical doctor on Lasix which he now takes about every other day. She was not on potassium replacement. This may be the cause of her increased PVCs.   May 24, 2014:  Sheila Reynolds is a 72 y.o. female who presents for follow up of her palpitations   Still has these palpitations.  No syncope.   Jan. 10, 2017:  Doing ok Still has DOE  Has palpitations on occasion. Not getting much exercise .  expalined a typical exercise program .   Start slow, gradually increase   May 07, 2016;   Sheila Reynolds is seen today for follow up .  Has been having more palpitations Seem to be getting worse  Went to there ER last week -  the ER note does not describe any specific arrhythmias. No passing out but she did get lightheaded when she had palpitations on one occasion   Has rare episodes of atypical CP .  Lasts a second Not associated with exertion .  Walks 1.5 miles 3-4 times a week .   This walking does not cause any palpitation of CP   July 17, 2016 Sheila Reynolds is seen today for follow up  30 day monitor showed no significant arrhythmias  Having leg pain and swelling  during night and day  Not worsened by exercise  No CP or dyspnea - still walking 1.5 miles 3-4 days a week   December 22, 2016:  Sheila Reynolds had a cath recently that showed a 70 % LAD stenosis . Has right arm pain but this is  not exertional . Has pins and needles sensation Has DOE with stairs and carrying something heavy .  Has not really noticed any difference in how she feels on the new meds which include Imdur 30 mg , Plavix 75 ,  + cough, chronic ,  No hemoptysis. Does not exercise regularly .    January 29, 2017: Sheila Reynolds is seen today for a follow-up visit.  She recently had rotational atherectomy and stenting of her proximal LAD and mid LAD. Still has some pins and needles sensation Has been walking in the mall.  Does not have CP . Has some DOE with that    Past Medical History:    Diagnosis Date  . Anemia    3 months ago anemic  . Anxiety    on meds  . Arthritis    "all over" (01/15/2017)  . Asthma   . Bronchitis with emphysema   . Chest pain   . Chronic bronchitis ([place])   . Coronary artery disease    Mild to moderate CAD  . Dysrhythmia    has fluttering at times  . Family history of anesthesia complication    daughter N/V  . Fibromyalgia   . GERD (gastroesophageal reflux disease)    on meds  . Headache(784.0)   . Heart murmur   . History of hiatal hernia   . Hx of echocardiogram    Echo (03/2013):  Tech limited; Mild focal basal septal hypertrophy, EF 60-65%, normal RVF  . Hyperlipidemia   . Hypertension   . Nonsustained ventricular tachycardia ([place])   . OSA on CPAP   . Palpitations   . PVC (premature ventricular contraction)    a. Holter 12/16: NSR, occ PAC,PVCs  . Shortness of breath    loses breathe at times and have to take extra breathes  . Type II diabetes mellitus ([place])     Past Surgical History:  Procedure Laterality Date  . BREAST SURGERY    . CARDIAC CATHETERIZATION  05/04/2007   reveals overall normal left ventricular systolic function. Ejection fraction 65-70%  . CATARACT EXTRACTION W/PHACO Right 07/20/2013   Procedure: CATARACT EXTRACTION PHACO AND INTRAOCULAR LENS PLACEMENT (IOC);  Surgeon: Marylynn Pearson, MD;  Location: [facility];  Service: Ophthalmology;  Laterality: Right;  . COLONOSCOPY W/ BIOPSIES AND POLYPECTOMY    . COLONOSCOPY WITH PROPOFOL N/A 09/07/2014   Procedure: COLONOSCOPY WITH PROPOFOL;  Surgeon: Juanita Craver, MD;  Location: [facility];  Service: Endoscopy;  Laterality: N/A;  . CORONARY ANGIOPLASTY WITH STENT PLACEMENT  01/15/2017  . CORONARY ATHERECTOMY N/A 01/15/2017   Procedure: CORONARY ATHERECTOMY;  Surgeon: Nelva Bush, MD;  Location: [facility];  Service: Cardiovascular;  Laterality: N/A;  . CORONARY BALLOON ANGIOPLASTY N/A 01/15/2017   Procedure: CORONARY BALLOON ANGIOPLASTY;  Surgeon: Nelva Bush, MD;  Location: [facility];  Service: Cardiovascular;  Laterality: N/A;  . CORONARY STENT INTERVENTION N/A 01/15/2017   Procedure: CORONARY STENT INTERVENTION;  Surgeon: Nelva Bush, MD;  Location: [facility];  Service: Cardiovascular;  Laterality: N/A;  . ESOPHAGOGASTRODUODENOSCOPY (EGD) WITH PROPOFOL N/A 09/07/2014   Procedure: ESOPHAGOGASTRODUODENOSCOPY (EGD) WITH PROPOFOL;  Surgeon: Juanita Craver, MD;  Location: [facility];  Service: Endoscopy;  Laterality: N/A;  . EXTERNAL EAR SURGERY Bilateral 1970s   tumors removed  . EYE SURGERY    . INTRAVASCULAR PRESSURE WIRE/FFR STUDY N/A 12/12/2016   Procedure: INTRAVASCULAR PRESSURE WIRE/FFR STUDY;  Surgeon: Nelva Bush, MD;  Location: [facility];  Service: Cardiovascular;  Laterality: N/A;  .  MINI SHUNT INSERTION Right 07/20/2013   Procedure: INSERTION OF GLAUCOMA FILTRATION DEVICE RIGHT EYE;  Surgeon: Marylynn Pearson, MD;  Location: [facility];  Service: Ophthalmology;  Laterality: Right;  . MITOMYCIN C APPLICATION Right 6/57/8469   Procedure: MITOMYCIN C APPLICATION;  Surgeon: Marylynn Pearson, MD;  Location: [facility];  Service: Ophthalmology;  Laterality: Right;  . MITOMYCIN C APPLICATION Right 08/09/5282   Procedure: MITOMYCIN C APPLICATION RIGHT EYE;  Surgeon: Marylynn Pearson, MD;  Location: [facility];  Service: Ophthalmology;  Laterality: Right;  . PLACEMENT OF BREAST IMPLANTS Bilateral 1992   "took all my breast tissue out; put implants in;fibrocystic breast disease "  . RIGHT/LEFT HEART CATH AND CORONARY ANGIOGRAPHY N/A 12/12/2016   Procedure: RIGHT/LEFT HEART CATH AND CORONARY ANGIOGRAPHY;  Surgeon: Nelva Bush, MD;  Location: [facility];  Service: Cardiovascular;  Laterality: N/A;  . TONSILLECTOMY    . TOTAL ABDOMINAL HYSTERECTOMY  1982  . TRABECULECTOMY Right 02/21/2015   Procedure: TRABECULECTOMY WITH [facility] ON THE RIGHT EYE;  Surgeon: Marylynn Pearson, MD;  Location: [facility];  Service: Ophthalmology;  Laterality:  Right;     Current Outpatient Medications  Medication Sig Dispense Refill  . acetaminophen (TYLENOL) 500 MG tablet Take 1,000 mg by mouth 2 (two) times daily as needed for moderate pain or headache.    . albuterol (PROVENTIL) (2.5 MG/3ML) 0.083% nebulizer solution Take 2.5 mg by nebulization daily as needed for wheezing or shortness of breath.     [name] aspirin EC 81 MG tablet Take 81 mg by mouth at bedtime.     [name] atorvastatin (LIPITOR) 40 MG tablet Take 1 tablet (40 mg total) by mouth daily. 30 tablet 6  . benzonatate (TESSALON) 200 MG capsule Take 1 capsule (200 mg total) by mouth 3 (three) times daily as needed for cough. (Patient not taking: Reported on 01/14/2017) 30 capsule 1  . brimonidine (ALPHAGAN P) 0.1 % SOLN Place 1 drop into the left eye 3 (three) times daily.     . brinzolamide (AZOPT) 1 % ophthalmic suspension Place 1 drop into the left eye 3 (three) times daily.     . budesonide-formoterol (SYMBICORT) 160-4.5 MCG/ACT inhaler Inhale 2 puffs into the lungs 2 (two) times daily. 1 Inhaler 0  . clopidogrel (PLAVIX) 75 MG tablet Take 1 tablet (75 mg total) by mouth daily. 30 tablet 11  . furosemide (LASIX) 20 MG tablet Take 1 tablet (20 mg total) by mouth every 3 (three) days. 30 tablet 5  . guaiFENesin (ROBITUSSIN) 100 MG/5ML SOLN Take 5 mLs by mouth every 6 (six) hours as needed for cough or to loosen phlegm.     . isosorbide mononitrate (IMDUR) 30 MG 24 hr tablet Take 1 tablet (30 mg total) by mouth daily. 30 tablet 11  . ketorolac (ACULAR) 0.5 % ophthalmic solution Place 1 drop into the left eye 4 (four) times daily.    [name] loteprednol (LOTEMAX) 0.5 % ophthalmic suspension Place 3-4 drops into both eyes See admin instructions. Instill 4 drops into the left eye once daily, and 3 drops in the right eye once daily    . Menthol, Topical Analgesic, (BIOFREEZE EX) Apply 1 application topically daily as needed (foot pain).    . metoprolol tartrate (LOPRESSOR) 50 MG tablet Take 50 mg by mouth 2  (two) times daily.     . montelukast (SINGULAIR) 10 MG tablet Take 10 mg by mouth at bedtime.    . Multiple Vitamins-Minerals (MULTIVITAMIN ADULTS PO) Take by mouth.    [name]  nitroGLYCERIN (NITROSTAT) 0.4 MG SL tablet Place 1 tablet (0.4 mg total) under the tongue every 5 (five) minutes as needed for chest pain. 25 tablet prn  . olmesartan (BENICAR) 20 MG tablet Take 20 mg by mouth daily.    . pantoprazole (PROTONIX) 40 MG tablet Take 1 tablet (40 mg total) by mouth daily. 30 tablet 6  . pregabalin (LYRICA) 75 MG capsule Take 75 mg by mouth at bedtime.    [name] PROAIR HFA 108 (90 Base) MCG/ACT inhaler Inhale 2 puffs into the lungs every 6 (six) hours as needed for wheezing. 1 Inhaler 12  . sitaGLIPtin-metformin (JANUMET) 50-500 MG tablet Take 1 tablet by mouth as directed. Take 1 tablet twice daily on Monday through Friday, take 1 tablet daily on Saturdays and Sundays    . timolol (BETIMOL) 0.5 % ophthalmic solution Place 1 drop into the left eye 2 (two) times daily.    [name] tolnaftate (TINACTIN) 1 % cream Apply 1 application topically daily as needed (foot itching).    . travoprost, benzalkonium, (TRAVATAN) 0.004 % ophthalmic solution Place 1 drop into both eyes at bedtime.    . Turmeric 500 MG TABS Take 500 mg by mouth daily.     No current facility-administered medications for this visit.     Allergies:   Crestor [rosuvastatin calcium]; Shellfish allergy; and Demerol [meperidine]    Social History:  The patient  reports that she quit smoking about 36 years ago. Her smoking use included cigarettes. She has a 4.00 pack-year smoking history. she has never used smokeless tobacco. She reports that she does not drink alcohol or use drugs.   Family History:  The patient's family history includes Cancer in her mother and paternal grandmother; Diabetes in her brother and father; Glaucoma in her brother and father; Heart disease in her father and paternal grandmother.    ROS:  Please see the history of  present illness.  As noted in the history.  All other systems are negative  Physical Exam: Blood pressure 136/80, pulse 73, height 5\' 2"  (1.575 m), weight 167 lb 12.8 oz (76.1 kg), SpO2 99 %.  GEN:  Well nourished, well developed in no acute distress HEENT: Normal NECK: No JVD; No carotid bruits LYMPHATICS: No lymphadenopathy CARDIAC: RR , no murmurs, rubs, gallops RESPIRATORY:  Clear to auscultation without rales, wheezing or rhonchi  ABDOMEN: Soft, non-tender, non-distended MUSCULOSKELETAL:  No edema; No deformity  SKIN: Warm and dry NEUROLOGIC:  Alert and oriented x 3  EKG:     Recent Labs: 03/19/2016: ALT 23 07/17/2016: TSH 0.796 01/16/2017: BUN 10; Creatinine, Ser 0.65; Hemoglobin 11.2; Platelets 163; Potassium 4.1; Sodium 136    Lipid Panel    Component Value Date/Time   CHOL 133 05/07/2016 1031   TRIG 80 05/07/2016 1031   HDL 50 05/07/2016 1031   CHOLHDL 2.7 05/07/2016 1031   CHOLHDL 3 05/24/2014 1650   VLDL 20.8 05/24/2014 1650   LDLCALC 67 05/07/2016 1031      Wt Readings from Last 3 Encounters:  01/29/17 167 lb 12.8 oz (76.1 kg)  01/16/17 165 lb 5.5 oz (75 kg)  01/12/17 166 lb 6.4 oz (75.5 kg)      Other studies Reviewed: Additional studies/ records that were reviewed today include: . Review of the above records demonstrates:    ASSESSMENT AND PLAN:   1.   Coronary artery disease:   S/p rotational atherectomy and stent placement.  She seems to be doing quite a bit better. She still has  a pins and needlelike sensation but this is very atypical and not related to coronary artery disease. He still has shortness breath with exertion.  I suggested to her that this is probably due to deconditioning.  I have advised her to exercise on a regular basis.   2. Hyperlipidemia -   continue atorvastatin.  We will check fasting lipids, liver enzymes, and basic metabolic profile in 6 months when I see her again.  4. Nonsustained ventricular tachycardia- stable   5.   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 Keturah Barre   Current medicines are reviewed at length with the patient today.  The patient does not have concerns regarding medicines.  The following changes have been made:  no change  Labs/ tests ordered today include:  No orders of the defined types were placed in this encounter.    Disposition:   FU with me in 6 months   Signed, Mertie Moores, MD  01/29/2017 2:55 PM    [address], [place], [place]  [address] Phone: [phone]; Fax: [phone]

## 2017-01-29 NOTE — Patient Instructions (Signed)
Medication Instructions:  Your physician recommends that you continue on your current medications as directed. Please refer to the Current Medication list given to you today.  Labwork: Your physician recommends that you return for lab work in: 6 months for BMET, LFTs, and Lipids   Testing/Procedures: NONE  Follow-Up: Your physician wants you to follow-up in: 6 months with Dr. Acie Fredrickson. You will receive a reminder letter in the mail two months in advance. If you don't receive a letter, please call our office to schedule the follow-up appointment.   If you need a refill on your cardiac medications before your next appointment, please call your pharmacy.

## 2017-02-04 DIAGNOSIS — H1132 Conjunctival hemorrhage, left eye: Secondary | ICD-10-CM | POA: Diagnosis not present

## 2017-02-10 HISTORY — PX: EYE SURGERY: SHX253

## 2017-02-18 ENCOUNTER — Encounter: Payer: Self-pay | Admitting: Internal Medicine

## 2017-02-18 ENCOUNTER — Ambulatory Visit (INDEPENDENT_AMBULATORY_CARE_PROVIDER_SITE_OTHER): Payer: Medicare Other | Admitting: Internal Medicine

## 2017-02-18 VITALS — BP 128/62 | HR 60 | Ht 62.0 in | Wt 163.6 lb

## 2017-02-18 DIAGNOSIS — J42 Unspecified chronic bronchitis: Secondary | ICD-10-CM

## 2017-02-18 DIAGNOSIS — G4733 Obstructive sleep apnea (adult) (pediatric): Secondary | ICD-10-CM

## 2017-02-18 DIAGNOSIS — D869 Sarcoidosis, unspecified: Secondary | ICD-10-CM | POA: Diagnosis not present

## 2017-02-18 MED ORDER — TRAMADOL HCL 50 MG PO TABS: 50.0000 mg | ORAL_TABLET | Freq: Four times a day (QID) | ORAL | 0 refills | Status: DC | PRN

## 2017-02-18 NOTE — Patient Instructions (Addendum)
Once you have your eye surgery, your eye doctor may be willing to let you try inhalers like Spiriva to see if they can help your cough without aggravating the glaucoma.  Order- Schedule CPAP mask fitting with daytime sleep center tech   Dx OSA  I suggest you make yourself an appointment wit [facility] to take your machine and mask in so you can talk to them about the motor noise your husband hears, as well as mask leaks.   Script printed to try tramadol for cough.

## 2017-02-18 NOTE — Progress Notes (Signed)
HPI  F former smoker followed for bronchitis, hx  Occular sarcoid, bronchitis/ nodules, OSA complicated by GERD, glaucoma, DM2 NPSG 12/25/03- AHI  2.9/ hr, desaturation to 91%, body weight 164 lbs NPSG-10/18/13- Mild OSA, AHI 9.3/ hr, weight 164 lbs  ACE 09/17/15-47-WNL PFT: 01/07/2011-FEV1 1.59/79%, FEV1/FVC 0.73, FEF 25-75% improved to 38% with bronchodilator. TLC 90% DLCO 55% Office Spirometry 06/09/16- moderate restriction of exhaled volume. FVC 1.51/68%, FEV1 1.20/70%, ratio 0.79, FEF 25-75% 1.19/73% Nuclear Stress Test 09/04/16- EF 65%, low risk. ECHO 07/31/16- Gr 1 DD ---------------------------------------------------------------------------------------------------------------------- 11/18/16- 72 yo female former smoker followed for bronchitis/bronchiolitis/lung nodules, history of ocular sarcoid, OSA, complicated by GERD, Glaucoma, DM 2, CAD Sarcoidosis; pt states she is having wheezing, SOB, and cough->productive at times-clear. Denies any fever or chills.  Nuclear Stress Test 09/04/16- EF 65%, low risk. ECHO 07/31/16- Gr 1 DD She complains of cough all summer, productive scant clear mucus. No fever or chills. Some wheeze. Increased dyspnea on exertion on hills and stairs. No acute event. Rescue inhaler helps some, used occasionally. Still feels heartburn or reflux taking Nexium once or twice daily. Substernal and mid thoracic spine level pains not clearly related to cough, swallowing or exertion. Not clear if these are single pain with radiation or 2 different problems. 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 ACE level 08/18/16-30 CXR 08/18/16- IMPRESSION: Bibasilar scarring.  No active disease.  02/18/17- 72 yo female former smoker followed for bronchitis/bronchiolitis/lung nodules, history of ocular sarcoid, OSA, complicated by GERD, Glaucoma, DM 2, CAD CPAP 9/[facility] ------OSA, air is escaping and blowing in her face,uses [facility] She has not been using CPAP regularly of uncomfortable mask fit and  air leak.Hollace Kinnier shows good control AHI 2.6/hour. She had persistent cough productive of clear / trace yellow sputum with no fever or sore throat.  Occasional wheeze.  Treated for glaucoma.  Glaucoma surgery.  Taking timolol eyedrops. Coronary arteries stented Symbicort 160, nebulized albuterol, Tessalon, albuterol HFA used less than once per week. CT chest 11/21/16-  IMPRESSION: Stable peribronchovascular nodularity in the posterior right upper lobe, favoring post infectious/inflammatory scarring. Aortic Atherosclerosis (ICD10-I70.0).  ROS-see HPI     + = pos Constitutional:   No-   weight loss, night sweats, fevers, chills, fatigue, lassitude. HEENT:   +  headaches, difficulty swallowing, tooth/dental problems, sore throat,       Some  sneezing, itching, ear ache, +nasal congestion, post nasal drip,  CV: + chest pain, no-orthopnea, PND, swelling in lower extremities, anasarca, dizziness, palpitations Resp: +  shortness of breath with exertion not at rest.            productive cough,  + non-productive cough,  No- coughing up of blood.               in color of mucus.  + wheezing.   Skin: No-   rash or lesions. GI:  +  heartburn, indigestion, abdominal pain, nausea, vomiting,  GU:  MS:  +  joint pain or swelling, + back pain Neuro-     nothing unusual Psych:  No- change in mood or affect. No depression or anxiety.  No memory loss.  OBJ   General- Alert, Oriented, Affect anxious, Distress- none acute Skin- rash-none, lesions- none, excoriation- none Lymphadenopathy- none Head- atraumatic            Eyes- Gross vision intact, PERRLA, conjunctivae clear secretions- not injected            Ears- Hearing aid  Nose- + turbinate edema, no-Septal dev, mucus, polyps, erosion, perforation             Throat- Mallampati III , mucosa clear , drainage- none, tonsils- atrophic Neck- flexible , trachea midline, no stridor , thyroid nl, carotid no bruit Chest - symmetrical  excursion , unlabored           Heart/CV- RRR , no murmur , no gallop  , no rub, nl s1 s2                           - JVD- none , edema- none, stasis changes- none, varices- none           Lung-  cough + dry , wheeze-none, rhonchi-none , dullness-none, rub- none           Chest wall- s Abd- No HSM Br/ Gen/ Rectal- Not done, not indicated Extrem- cyanosis- none, clubbing, none, atrophy- none, strength- nl.  Neuro- grossly intact to observation

## 2017-02-19 ENCOUNTER — Telehealth (HOSPITAL_COMMUNITY): Payer: Self-pay | Admitting: Pharmacy Technician

## 2017-02-19 DIAGNOSIS — M25511 Pain in right shoulder: Secondary | ICD-10-CM | POA: Diagnosis not present

## 2017-02-19 DIAGNOSIS — R233 Spontaneous ecchymoses: Secondary | ICD-10-CM | POA: Diagnosis not present

## 2017-02-19 DIAGNOSIS — I2510 Atherosclerotic heart disease of native coronary artery without angina pectoris: Secondary | ICD-10-CM | POA: Diagnosis not present

## 2017-02-19 DIAGNOSIS — Z1389 Encounter for screening for other disorder: Secondary | ICD-10-CM | POA: Diagnosis not present

## 2017-02-19 DIAGNOSIS — I119 Hypertensive heart disease without heart failure: Secondary | ICD-10-CM | POA: Diagnosis not present

## 2017-02-19 DIAGNOSIS — E1165 Type 2 diabetes mellitus with hyperglycemia: Secondary | ICD-10-CM | POA: Diagnosis not present

## 2017-02-22 NOTE — Assessment & Plan Note (Signed)
Neck asthmatic pattern potentially worsened by atenolol.  We would like to let her try a muscarinic LAMA dilator, but it may be best to wait until after her glaucoma surgery. Try Tramadol for cough

## 2017-02-22 NOTE — Assessment & Plan Note (Signed)
We are going to help her with CPAP mask fitting at the sleep center in hopes of letting her resume regular use.  Fortunately her untreated sleep apnea was mild.  Continue pressure 9.

## 2017-02-22 NOTE — Assessment & Plan Note (Signed)
In long-term remission but with residual pulmonary scarring which contributes to her bronchitis.

## 2017-02-24 ENCOUNTER — Encounter (HOSPITAL_COMMUNITY)
Admission: RE | Admit: 2017-02-24 | Discharge: 2017-02-24 | Disposition: A | Discharge status: Home / Self Care | Payer: Medicare Other | Source: Ambulatory Visit | Attending: Cardiovascular Disease | Admitting: Cardiovascular Disease

## 2017-02-24 ENCOUNTER — Encounter (HOSPITAL_COMMUNITY): Payer: Self-pay

## 2017-02-24 VITALS — Ht 62.0 in | Wt 166.9 lb

## 2017-02-24 DIAGNOSIS — J45909 Unspecified asthma, uncomplicated: Secondary | ICD-10-CM | POA: Insufficient documentation

## 2017-02-24 DIAGNOSIS — J449 Chronic obstructive pulmonary disease, unspecified: Secondary | ICD-10-CM | POA: Insufficient documentation

## 2017-02-24 DIAGNOSIS — G4733 Obstructive sleep apnea (adult) (pediatric): Secondary | ICD-10-CM | POA: Insufficient documentation

## 2017-02-24 DIAGNOSIS — D649 Anemia, unspecified: Secondary | ICD-10-CM | POA: Insufficient documentation

## 2017-02-24 DIAGNOSIS — Z79899 Other long term (current) drug therapy: Secondary | ICD-10-CM | POA: Insufficient documentation

## 2017-02-24 DIAGNOSIS — Z9861 Coronary angioplasty status: Secondary | ICD-10-CM | POA: Insufficient documentation

## 2017-02-24 DIAGNOSIS — Z7982 Long term (current) use of aspirin: Secondary | ICD-10-CM | POA: Insufficient documentation

## 2017-02-24 DIAGNOSIS — M1990 Unspecified osteoarthritis, unspecified site: Secondary | ICD-10-CM | POA: Insufficient documentation

## 2017-02-24 DIAGNOSIS — M797 Fibromyalgia: Secondary | ICD-10-CM | POA: Insufficient documentation

## 2017-02-24 DIAGNOSIS — Z955 Presence of coronary angioplasty implant and graft: Secondary | ICD-10-CM

## 2017-02-24 DIAGNOSIS — I2510 Atherosclerotic heart disease of native coronary artery without angina pectoris: Secondary | ICD-10-CM | POA: Insufficient documentation

## 2017-02-24 DIAGNOSIS — E119 Type 2 diabetes mellitus without complications: Secondary | ICD-10-CM | POA: Insufficient documentation

## 2017-02-24 DIAGNOSIS — E785 Hyperlipidemia, unspecified: Secondary | ICD-10-CM | POA: Insufficient documentation

## 2017-02-24 DIAGNOSIS — F419 Anxiety disorder, unspecified: Secondary | ICD-10-CM | POA: Insufficient documentation

## 2017-02-24 DIAGNOSIS — Z7901 Long term (current) use of anticoagulants: Secondary | ICD-10-CM | POA: Insufficient documentation

## 2017-02-24 DIAGNOSIS — K219 Gastro-esophageal reflux disease without esophagitis: Secondary | ICD-10-CM | POA: Insufficient documentation

## 2017-02-24 DIAGNOSIS — Z79891 Long term (current) use of opiate analgesic: Secondary | ICD-10-CM | POA: Insufficient documentation

## 2017-02-24 DIAGNOSIS — Z87891 Personal history of nicotine dependence: Secondary | ICD-10-CM | POA: Insufficient documentation

## 2017-02-24 DIAGNOSIS — I10 Essential (primary) hypertension: Secondary | ICD-10-CM | POA: Insufficient documentation

## 2017-02-24 DIAGNOSIS — Z48812 Encounter for surgical aftercare following surgery on the circulatory system: Secondary | ICD-10-CM | POA: Insufficient documentation

## 2017-02-24 NOTE — Progress Notes (Signed)
Cardiac Individual Treatment Plan  Patient Details  Name: Sheila Reynolds MRN: [id number] Date of Birth: 02-01-1946 Referring Provider:     CARDIAC REHAB PHASE II ORIENTATION from 02/24/2017 in [place]  Referring Provider  [name], Philip MD      Initial Encounter Date:    CARDIAC REHAB PHASE II ORIENTATION from 02/24/2017 in [place]  Date  02/24/17  Referring Provider  [name], Arnette Norris MD      Visit Diagnosis: Status post coronary artery stent placement  Patient's Home Medications on Admission:  Current Outpatient Medications:  .  acetaminophen (TYLENOL) 500 MG tablet, Take 1,000 mg by mouth 2 (two) times daily as needed for moderate pain or headache., Disp: , Rfl:  .  albuterol (PROVENTIL) (2.5 MG/3ML) 0.083% nebulizer solution, Take 2.5 mg by nebulization daily as needed for wheezing or shortness of breath. , Disp: , Rfl:  .  aspirin EC 81 MG tablet, Take 81 mg by mouth at bedtime. , Disp: , Rfl:  .  atorvastatin (LIPITOR) 40 MG tablet, Take 1 tablet (40 mg total) by mouth daily., Disp: 30 tablet, Rfl: 6 .  benzonatate (TESSALON) 200 MG capsule, Take 1 capsule (200 mg total) by mouth 3 (three) times daily as needed for cough., Disp: 30 capsule, Rfl: 1 .  brimonidine (ALPHAGAN P) 0.1 % SOLN, Place 1 drop into the left eye 3 (three) times daily. , Disp: , Rfl:  .  brinzolamide (AZOPT) 1 % ophthalmic suspension, Place 1 drop into the left eye 3 (three) times daily. , Disp: , Rfl:  .  clopidogrel (PLAVIX) 75 MG tablet, Take 1 tablet (75 mg total) by mouth daily., Disp: 30 tablet, Rfl: 11 .  furosemide (LASIX) 20 MG tablet, Take 1 tablet (20 mg total) by mouth every 3 (three) days., Disp: 30 tablet, Rfl: 5 .  guaiFENesin (ROBITUSSIN) 100 MG/5ML SOLN, Take 5 mLs by mouth every 6 (six) hours as needed for cough or to loosen phlegm. , Disp: , Rfl:  .  isosorbide mononitrate (IMDUR) 30 MG 24 hr tablet, Take 1 tablet (30 mg  total) by mouth daily., Disp: 30 tablet, Rfl: 11 .  loteprednol (LOTEMAX) 0.5 % ophthalmic suspension, Place 3-4 drops into both eyes See admin instructions. Instill 4 drops into the left eye once daily, and 3 drops in the right eye once daily, Disp: , Rfl:  .  Menthol, Topical Analgesic, (BIOFREEZE EX), Apply 1 application topically daily as needed (foot pain)., Disp: , Rfl:  .  metoprolol tartrate (LOPRESSOR) 50 MG tablet, Take 50 mg by mouth 2 (two) times daily. , Disp: , Rfl:  .  montelukast (SINGULAIR) 10 MG tablet, Take 10 mg by mouth at bedtime., Disp: , Rfl:  .  Multiple Vitamins-Minerals (MULTIVITAMIN ADULTS PO), Take by mouth., Disp: , Rfl:  .  nitroGLYCERIN (NITROSTAT) 0.4 MG SL tablet, Place 1 tablet (0.4 mg total) under the tongue every 5 (five) minutes as needed for chest pain., Disp: 25 tablet, Rfl: prn .  olmesartan (BENICAR) 20 MG tablet, Take 20 mg by mouth daily., Disp: , Rfl:  .  pantoprazole (PROTONIX) 40 MG tablet, Take 1 tablet (40 mg total) by mouth daily., Disp: 30 tablet, Rfl: 6 .  pregabalin (LYRICA) 75 MG capsule, Take 75 mg by mouth at bedtime., Disp: , Rfl:  .  PROAIR HFA 108 (90 Base) MCG/ACT inhaler, Inhale 2 puffs into the lungs every 6 (six) hours as needed for wheezing., Disp: 1  Inhaler, Rfl: 12 .  sitaGLIPtin-metformin (JANUMET) 50-500 MG tablet, Take 1 tablet by mouth as directed. Take 1 tablet twice daily on Monday through Friday, take 1 tablet daily on Saturdays and Sundays, Disp: , Rfl:  .  tolnaftate (TINACTIN) 1 % cream, Apply 1 application topically daily as needed (foot itching)., Disp: , Rfl:  .  traMADol (ULTRAM) 50 MG tablet, Take 1 tablet (50 mg total) by mouth every 6 (six) hours as needed., Disp: 40 tablet, Rfl: 0 .  travoprost, benzalkonium, (TRAVATAN) 0.004 % ophthalmic solution, Place 1 drop into both eyes at bedtime., Disp: , Rfl:  .  Turmeric 500 MG TABS, Take 500 mg by mouth daily., Disp: , Rfl:  .  ketorolac (ACULAR) 0.5 % ophthalmic solution,  Place 1 drop into the left eye 4 (four) times daily., Disp: , Rfl:  .  timolol (BETIMOL) 0.5 % ophthalmic solution, Place 1 drop into the left eye 2 (two) times daily., Disp: , Rfl:   Past Medical History: Past Medical History:  Diagnosis Date  . Anemia    3 months ago anemic  . Anxiety    on meds  . Arthritis    "all over" (01/15/2017)  . Asthma   . Bronchitis with emphysema   . Chest pain   . Chronic bronchitis ([place])   . Coronary artery disease    Mild to moderate CAD  . Dysrhythmia    has fluttering at times  . Family history of anesthesia complication    daughter N/V  . Fibromyalgia   . GERD (gastroesophageal reflux disease)    on meds  . Headache(784.0)   . Heart murmur   . History of hiatal hernia   . Hx of echocardiogram    Echo (03/2013):  Tech limited; Mild focal basal septal hypertrophy, EF 60-65%, normal RVF  . Hyperlipidemia   . Hypertension   . Nonsustained ventricular tachycardia ([place])   . OSA on CPAP   . Palpitations   . PVC (premature ventricular contraction)    a. Holter 12/16: NSR, occ PAC,PVCs  . Shortness of breath    loses breathe at times and have to take extra breathes  . Type II diabetes mellitus ([facility])     Tobacco Use: Social History   Tobacco Use  Smoking Status Former Smoker  . Packs/day: 1.00  . Years: 4.00  . Pack years: 4.00  . Types: Cigarettes  . Last attempt to quit: 02/11/1980  . Years since quitting: 37.0  Smokeless Tobacco Never Used    Labs: Recent Review Flowsheet Data    Labs for ITP Cardiac and Pulmonary Rehab Latest Ref Rng & Units 09/01/2012 05/24/2014 05/07/2016 12/12/2016 12/12/2016   Cholestrol 100 - 199 mg/dL 112 157 133 - -   LDLCALC 0 - 99 mg/dL 58 85 67 - -   HDL >39 mg/dL 37.70(L) 51.70 50 - -   Trlycerides 0 - 149 mg/dL 83.0 104.0 80 - -   PHART 7.350 - 7.450 - - - - 7.395   PCO2ART 32.0 - 48.0 mmHg - - - - 40.7   HCO3 20.0 - 28.0 mmol/L - - - 26.7 24.9   TCO2 22 - 32 mmol/L - - - 28 26   O2SAT % - - - 71.0  97.0      Capillary Blood Glucose: Lab Results  Component Value Date   GLUCAP 111 (H) 01/16/2017   GLUCAP 137 (H) 01/15/2017   GLUCAP 99 01/15/2017   GLUCAP 111 (H) 01/15/2017  GLUCAP 96 12/12/2016     Exercise Target Goals: Date: 02/24/17  Exercise Program Goal: Individual exercise prescription set with THRR, safety & activity barriers. Participant demonstrates ability to understand and report RPE using BORG scale, to self-measure pulse accurately, and to acknowledge the importance of the exercise prescription.  Exercise Prescription Goal: Starting with aerobic activity 30 plus minutes a day, 3 days per week for initial exercise prescription. Provide home exercise prescription and guidelines that participant acknowledges understanding prior to discharge.  Activity Barriers & Risk Stratification: Activity Barriers & Cardiac Risk Stratification - 02/24/17 0957      Activity Barriers & Cardiac Risk Stratification   Activity Barriers  Arthritis;Joint Problems;Deconditioning;Muscular Weakness;Shortness of Breath;Other (comment)    Comments  R shoulder/arm pain; L shoulder limitation    Cardiac Risk Stratification  High       6 Minute Walk: 6 Minute Walk    Row Name 02/24/17 1056         6 Minute Walk   Phase  Initial     Distance  1270 feet     Walk Time  6 minutes     # of Rest Breaks  0     MPH  2.4     METS  2.8     RPE  11     VO2 Peak  9.8     Symptoms  Yes (comment)     Comments  knee pain, neck pain an d R arm pain (denied chest pain)     Resting HR  65 bpm     Resting BP  122/60     Resting Oxygen Saturation   98 %     Exercise Oxygen Saturation  during 6 min walk  98 %     Max Ex. HR  105 bpm     Max Ex. BP  144/80     2 Minute Post BP  122/84        Oxygen Initial Assessment:   Oxygen Re-Evaluation:   Oxygen Discharge (Final Oxygen Re-Evaluation):   Initial Exercise Prescription: Initial Exercise Prescription - 02/24/17 1000      Date of  Initial Exercise RX and Referring Provider   Date  02/24/17    Referring Provider  [name], Philip MD      Treadmill   MPH  2    Grade  0    Minutes  10    METs  2.53      Recumbant Bike   Level  1.5    Minutes  10    METs  1.5      NuStep   Level  2    SPM  60    Minutes  10    METs  1.7      Prescription Details   Frequency (times per week)  3    Duration  Progress to 30 minutes of continuous aerobic without signs/symptoms of physical distress      Intensity   THRR 40-80% of Max Heartrate  60-129    Ratings of Perceived Exertion  11-15    Perceived Dyspnea  0-4      Progression   Progression  Continue to progress workloads to maintain intensity without signs/symptoms of physical distress.      Resistance Training   Training Prescription  Yes    Weight  1lb    Reps  10-15       Perform Capillary Blood Glucose checks as needed.  Exercise Prescription Changes:  Exercise Comments:   Exercise Goals and Review: Exercise Goals    Row Name 02/24/17 0954 02/24/17 0959 02/24/17 1102         Exercise Goals   Increase Physical Activity  Yes  -  -     Intervention  Provide advice, education, support and counseling about physical activity/exercise needs.;Develop an individualized exercise prescription for aerobic and resistive training based on initial evaluation findings, risk stratification, comorbidities and participant's personal goals.  -  -     Expected Outcomes  Achievement of increased cardiorespiratory fitness and enhanced flexibility, muscular endurance and strength shown through measurements of functional capacity and personal statement of participant.  -  -     Increase Strength and Stamina  Yes  - be able to climb stairs without SOB, improve energy levels and desire to exercise  Yes     Intervention  Provide advice, education, support and counseling about physical activity/exercise needs.;Develop an individualized exercise prescription for aerobic and  resistive training based on initial evaluation findings, risk stratification, comorbidities and participant's personal goals.  -  -     Expected Outcomes  Achievement of increased cardiorespiratory fitness and enhanced flexibility, muscular endurance and strength shown through measurements of functional capacity and personal statement of participant.  -  -     Able to understand and use rate of perceived exertion (RPE) scale  Yes  -  -     Intervention  Provide education and explanation on how to use RPE scale  -  -     Expected Outcomes  Short Term: Able to use RPE daily in rehab to express subjective intensity level;Long Term:  Able to use RPE to guide intensity level when exercising independently  -  -     Knowledge and understanding of Target Heart Rate Range (THRR)  Yes  -  -     Intervention  Provide education and explanation of THRR including how the numbers were predicted and where they are located for reference  -  -     Expected Outcomes  Long Term: Able to use THRR to govern intensity when exercising independently;Short Term: Able to state/look up THRR;Short Term: Able to use daily as guideline for intensity in rehab  -  -     Able to check pulse independently  Yes  -  -     Intervention  Provide education and demonstration on how to check pulse in carotid and radial arteries.;Review the importance of being able to check your own pulse for safety during independent exercise  -  -     Expected Outcomes  Short Term: Able to explain why pulse checking is important during independent exercise;Long Term: Able to check pulse independently and accurately  -  -     Understanding of Exercise Prescription  Yes  -  -     Intervention  Provide education, explanation, and written materials on patient's individual exercise prescription  -  -     Expected Outcomes  Short Term: Able to explain program exercise prescription;Long Term: Able to explain home exercise prescription to exercise independently  -  -         Exercise Goals Re-Evaluation :    Discharge Exercise Prescription (Final Exercise Prescription Changes):   Nutrition:  Target Goals: Understanding of nutrition guidelines, daily intake of sodium 1500mg , cholesterol 200mg , calories 30% from fat and 7% or less from saturated fats, daily to have 5 or more servings of fruits and vegetables.  Biometrics: Pre Biometrics - 02/24/17 1102      Pre Biometrics   Height  5\' 2"  (1.575 m)    Weight  166 lb 14.2 oz (75.7 kg)    Waist Circumference  38.5 inches    Hip Circumference  41.5 inches    Waist to Hip Ratio  0.93 %    BMI (Calculated)  30.52    Triceps Skinfold  47 mm    % Body Fat  45 %    Grip Strength  22 kg    Flexibility  0 in    Single Leg Stand  3.21 seconds        Nutrition Therapy Plan and Nutrition Goals:   Nutrition Discharge: Nutrition Scores:   Nutrition Goals Re-Evaluation:   Nutrition Goals Re-Evaluation:   Nutrition Goals Discharge (Final Nutrition Goals Re-Evaluation):   Psychosocial: Target Goals: Acknowledge presence or absence of significant depression and/or stress, maximize coping skills, provide positive support system. Participant is able to verbalize types and ability to use techniques and skills needed for reducing stress and depression.  Initial Review & Psychosocial Screening: Initial Psych Review & Screening - 02/24/17 1102      Initial Review   Current issues with  None Identified      Family Dynamics   Good Support System?  Yes    Comments  spouse, children, friends       Barriers   Psychosocial barriers to participate in program  There are no identifiable barriers or psychosocial needs.      Screening Interventions   Interventions  Encouraged to exercise       Quality of Life Scores: Quality of Life - 02/24/17 1103      Quality of Life Scores   Health/Function Pre  18.7 %    Socioeconomic Pre  20.38 %    Psych/Spiritual Pre  23.14 %    Family Pre  22.8 %     GLOBAL Pre  20.56 %       PHQ-9: Recent Review Flowsheet Data    Depression screen [facility] 2/9 01/27/2017   Decreased Interest 0   Down, Depressed, Hopeless 1    PHQ - 2 Score 1     Interpretation of Total Score  Total Score Depression Severity:  1-4 = Minimal depression, 5-9 = Mild depression, 10-14 = Moderate depression, 15-19 = Moderately severe depression, 20-27 = Severe depression   Psychosocial Evaluation and Intervention:   Psychosocial Re-Evaluation:   Psychosocial Discharge (Final Psychosocial Re-Evaluation):   Vocational Rehabilitation: Provide vocational rehab assistance to qualifying candidates.   Vocational Rehab Evaluation & Intervention: Vocational Rehab - 02/24/17 1101      Initial Vocational Rehab Evaluation & Intervention   Assessment shows need for Vocational Rehabilitation  No returning to work       Education: Education Goals: Education classes will be provided on a weekly basis, covering required topics. Participant will state understanding/return demonstration of topics presented.  Learning Barriers/Preferences: Learning Barriers/Preferences - 02/24/17 1056      Learning Barriers/Preferences   Learning Barriers  Sight;Hearing       Education Topics: Count Your Pulse:  -Group instruction provided by verbal instruction, demonstration, patient participation and written materials to support subject.  Instructors address importance of being able to find your pulse and how to count your pulse when at home without a heart monitor.  Patients get hands on experience counting their pulse with staff help and individually.   Heart Attack, Angina, and Risk Factor Modification:  -  Group instruction provided by verbal instruction, video, and written materials to support subject.  Instructors address signs and symptoms of angina and heart attacks.    Also discuss risk factors for heart disease and how to make changes to improve heart health risk  factors.   Functional Fitness:  -Group instruction provided by verbal instruction, demonstration, patient participation, and written materials to support subject.  Instructors address safety measures for doing things around the house.  Discuss how to get up and down off the floor, how to pick things up properly, how to safely get out of a chair without assistance, and balance training.   Meditation and Mindfulness:  -Group instruction provided by verbal instruction, patient participation, and written materials to support subject.  Instructor addresses importance of mindfulness and meditation practice to help reduce stress and improve awareness.  Instructor also leads participants through a meditation exercise.    Stretching for Flexibility and Mobility:  -Group instruction provided by verbal instruction, patient participation, and written materials to support subject.  Instructors lead participants through series of stretches that are designed to increase flexibility thus improving mobility.  These stretches are additional exercise for major muscle groups that are typically performed during regular warm up and cool down.   Hands Only CPR:  -Group verbal, video, and participation provides a basic overview of AHA guidelines for community CPR. Role-play of emergencies allow participants the opportunity to practice calling for help and chest compression technique with discussion of AED use.   Hypertension: -Group verbal and written instruction that provides a basic overview of hypertension including the most recent diagnostic guidelines, risk factor reduction with self-care instructions and medication management.    Nutrition I class: Heart Healthy Eating:  -Group instruction provided by PowerPoint slides, verbal discussion, and written materials to support subject matter. The instructor gives an explanation and review of the Therapeutic Lifestyle Changes diet recommendations, which includes a  discussion on lipid goals, dietary fat, sodium, fiber, plant stanol/sterol esters, sugar, and the components of a well-balanced, healthy diet.   Nutrition II class: Lifestyle Skills:  -Group instruction provided by PowerPoint slides, verbal discussion, and written materials to support subject matter. The instructor gives an explanation and review of label reading, grocery shopping for heart health, heart healthy recipe modifications, and ways to make healthier choices when eating out.   Diabetes Question & Answer:  -Group instruction provided by PowerPoint slides, verbal discussion, and written materials to support subject matter. The instructor gives an explanation and review of diabetes co-morbidities, pre- and post-prandial blood glucose goals, pre-exercise blood glucose goals, signs, symptoms, and treatment of hypoglycemia and hyperglycemia, and foot care basics.   Diabetes Blitz:  -Group instruction provided by PowerPoint slides, verbal discussion, and written materials to support subject matter. The instructor gives an explanation and review of the physiology behind type 1 and type 2 diabetes, diabetes medications and rational behind using different medications, pre- and post-prandial blood glucose recommendations and Hemoglobin A1c goals, diabetes diet, and exercise including blood glucose guidelines for exercising safely.    Portion Distortion:  -Group instruction provided by PowerPoint slides, verbal discussion, written materials, and food models to support subject matter. The instructor gives an explanation of serving size versus portion size, changes in portions sizes over the last 20 years, and what consists of a serving from each food group.   Stress Management:  -Group instruction provided by verbal instruction, video, and written materials to support subject matter.  Instructors review role of stress in heart disease  and how to cope with stress positively.     Exercising on Your  Own:  -Group instruction provided by verbal instruction, power point, and written materials to support subject.  Instructors discuss benefits of exercise, components of exercise, frequency and intensity of exercise, and end points for exercise.  Also discuss use of nitroglycerin and activating EMS.  Review options of places to exercise outside of rehab.  Review guidelines for sex with heart disease.   Cardiac Drugs I:  -Group instruction provided by verbal instruction and written materials to support subject.  Instructor reviews cardiac drug classes: antiplatelets, anticoagulants, beta blockers, and statins.  Instructor discusses reasons, side effects, and lifestyle considerations for each drug class.   Cardiac Drugs II:  -Group instruction provided by verbal instruction and written materials to support subject.  Instructor reviews cardiac drug classes: angiotensin converting enzyme inhibitors (ACE-I), angiotensin II receptor blockers (ARBs), nitrates, and calcium channel blockers.  Instructor discusses reasons, side effects, and lifestyle considerations for each drug class.   Anatomy and Physiology of the Circulatory System:  Group verbal and written instruction and models provide basic cardiac anatomy and physiology, with the coronary electrical and arterial systems. Review of: AMI, Angina, Valve disease, Heart Failure, Peripheral Artery Disease, Cardiac Arrhythmia, Pacemakers, and the ICD.   Other Education:  -Group or individual verbal, written, or video instructions that support the educational goals of the cardiac rehab program.   Knowledge Questionnaire Score: Knowledge Questionnaire Score - 02/24/17 1101      Knowledge Questionnaire Score   Pre Score  20/24       Core Components/Risk Factors/Patient Goals at Admission: Personal Goals and Risk Factors at Admission - 02/24/17 1103      Core Components/Risk Factors/Patient Goals on Admission    Weight Management   Yes;Obesity;Weight Maintenance;Weight Loss    Intervention  Weight Management: Develop a combined nutrition and exercise program designed to reach desired caloric intake, while maintaining appropriate intake of nutrient and fiber, sodium and fats, and appropriate energy expenditure required for the weight goal.;Weight Management: Provide education and appropriate resources to help participant work on and attain dietary goals.;Weight Management/Obesity: Establish reasonable short term and long term weight goals.;Obesity: Provide education and appropriate resources to help participant work on and attain dietary goals.    Admit Weight  166 lb 14.2 oz (75.7 kg)    Goal Weight: Short Term  160 lb (72.6 kg)    Goal Weight: Long Term  150 lb (68 kg)    Expected Outcomes  Short Term: Continue to assess and modify interventions until short term weight is achieved;Long Term: Adherence to nutrition and physical activity/exercise program aimed toward attainment of established weight goal;Weight Maintenance: Understanding of the daily nutrition guidelines, which includes 25-35% calories from fat, 7% or less cal from saturated fats, less than 200mg  cholesterol, less than 1.5gm of sodium, & 5 or more servings of fruits and vegetables daily;Weight Loss: Understanding of general recommendations for a balanced deficit meal plan, which promotes 1-2 lb weight loss per week and includes a negative energy balance of [phone] kcal/d;Understanding recommendations for meals to include 15-35% energy as protein, 25-35% energy from fat, 35-60% energy from carbohydrates, less than 200mg  of dietary cholesterol, 20-35 gm of total fiber daily;Understanding of distribution of calorie intake throughout the day with the consumption of 4-5 meals/snacks    Improve shortness of breath with ADL's  Yes    Intervention  Provide education, individualized exercise plan and daily activity instruction to help decrease symptoms of  SOB with activities of  daily living.    Expected Outcomes  Short Term: Achieves a reduction of symptoms when performing activities of daily living.    Diabetes  Yes    Intervention  Provide education about signs/symptoms and action to take for hypo/hyperglycemia.;Provide education about proper nutrition, including hydration, and aerobic/resistive exercise prescription along with prescribed medications to achieve blood glucose in normal ranges: Fasting glucose 65-99 mg/dL    Expected Outcomes  Long Term: Attainment of HbA1C < 7%.;Short Term: Participant verbalizes understanding of the signs/symptoms and immediate care of hyper/hypoglycemia, proper foot care and importance of medication, aerobic/resistive exercise and nutrition plan for blood glucose control.    Hypertension  Yes    Intervention  Provide education on lifestyle modifcations including regular physical activity/exercise, weight management, moderate sodium restriction and increased consumption of fresh fruit, vegetables, and low fat dairy, alcohol moderation, and smoking cessation.;Monitor prescription use compliance.    Expected Outcomes  Short Term: Continued assessment and intervention until BP is < 140/11mm HG in hypertensive participants. < 130/46mm HG in hypertensive participants with diabetes, heart failure or chronic kidney disease.;Long Term: Maintenance of blood pressure at goal levels.    Lipids  Yes    Intervention  Provide education and support for participant on nutrition & aerobic/resistive exercise along with prescribed medications to achieve LDL 70mg , HDL >40mg .    Expected Outcomes  Short Term: Participant states understanding of desired cholesterol values and is compliant with medications prescribed. Participant is following exercise prescription and nutrition guidelines.;Long Term: Cholesterol controlled with medications as prescribed, with individualized exercise RX and with personalized nutrition plan. Value goals: LDL < 70mg , HDL > 40 mg.     Stress  Yes    Intervention  Offer individual and/or small group education and counseling on adjustment to heart disease, stress management and health-related lifestyle change. Teach and support self-help strategies.;Refer participants experiencing significant psychosocial distress to appropriate mental health specialists for further evaluation and treatment. When possible, include family members and significant others in education/counseling sessions.    Expected Outcomes  Short Term: Participant demonstrates changes in health-related behavior, relaxation and other stress management skills, ability to obtain effective social support, and compliance with psychotropic medications if prescribed.;Long Term: Emotional wellbeing is indicated by absence of clinically significant psychosocial distress or social isolation.       Core Components/Risk Factors/Patient Goals Review:    Core Components/Risk Factors/Patient Goals at Discharge (Final Review):    ITP Comments: ITP Comments    Row Name 02/24/17 0940           ITP Comments  Dr. Fransico Him, Medical Director          Comments: Patient attended orientation from  [place] to [facility]  to review rules and guidelines for program. Completed 6 minute walk test, Intitial ITP, and exercise prescription.  VSS. Telemetry-sinus rhythm.  Asymptomatic. Andi Hence, RN, BSN Cardiac Pulmonary Rehab

## 2017-02-24 NOTE — Progress Notes (Signed)
Cardiac Rehab Medication Review by a Pharmacist  Does the patient  feel that his/her medications are working for him/her?  yes  Has the patient been experiencing any side effects to the medications prescribed?  yes  Does the patient measure his/her own blood pressure or blood glucose at home?  yes   Does the patient have any problems obtaining medications due to transportation or finances?   no  Understanding of regimen: excellent Understanding of indications: excellent Potential of compliance: excellent    Nurse comments: Kaylanni says she has been taking 25 mg of metoprolol in the morning and 50 mg in the evening. Due to complaints of fatigue. Cayle is also has betimol eye drops that she is not taking currently, prescribed by her eye doctor. Zurri says she will contact her eye doctor about her eye drops. I will notify Dr Acie Fredrickson that Mrs Bobrowski is taking a total of 75 mg of metoprolol daily.    Harrell Gave RN BSN 02/24/2017 10:49 AM

## 2017-02-25 ENCOUNTER — Telehealth: Payer: Self-pay | Admitting: Nurse Practitioner

## 2017-02-25 MED ORDER — METOPROLOL TARTRATE 50 MG PO TABS: ORAL_TABLET | ORAL | 3 refills | Status: DC

## 2017-02-25 NOTE — Telephone Encounter (Signed)
Dr. Acie Fredrickson is aware and patient's medication list has been updated

## 2017-02-25 NOTE — Progress Notes (Signed)
Sheila Reynolds 72 y.o. female DOB: 04/06/1945 MRN: [id number]      Nutrition Note  1. Status post coronary artery stent placement    Past Medical History:  Diagnosis Date  . Anemia    3 months ago anemic  . Anxiety    on meds  . Arthritis    "all over" (01/15/2017)  . Asthma   . Bronchitis with emphysema   . Chest pain   . Chronic bronchitis ([place])   . Coronary artery disease    Mild to moderate CAD  . Dysrhythmia    has fluttering at times  . Family history of anesthesia complication    daughter N/V  . Fibromyalgia   . GERD (gastroesophageal reflux disease)    on meds  . Headache(784.0)   . Heart murmur   . History of hiatal hernia   . Hx of echocardiogram    Echo (03/2013):  Tech limited; Mild focal basal septal hypertrophy, EF 60-65%, normal RVF  . Hyperlipidemia   . Hypertension   . Nonsustained ventricular tachycardia ([place])   . OSA on CPAP   . Palpitations   . PVC (premature ventricular contraction)    a. Holter 12/16: NSR, occ PAC,PVCs  . Shortness of breath    loses breathe at times and have to take extra breathes  . Type II diabetes mellitus ([place])    Meds reviewed. Janumet noted  HT: Ht Readings from Last 1 Encounters:  02/24/17 5\' 2"  (1.575 m)    WT: Wt Readings from Last 3 Encounters:  02/24/17 166 lb 14.2 oz (75.7 kg)  02/18/17 163 lb 9.6 oz (74.2 kg)  01/29/17 167 lb 12.8 oz (76.1 kg)     BMI 30.5   Current tobacco use? No   Labs:  Lipid Panel     Component Value Date/Time   CHOL 133 05/07/2016 1031   TRIG 80 05/07/2016 1031   HDL 50 05/07/2016 1031   CHOLHDL 2.7 05/07/2016 1031   CHOLHDL 3 05/24/2014 1650   VLDL 20.8 05/24/2014 1650   LDLCALC 67 05/07/2016 1031    No results found for: HGBA1C CBG (last 3)  No results for input(s): GLUCAP in the last 72 hours.  Nutrition Note Spoke with pt. Nutrition plan and goals reviewed with pt. Pt is following Step 2 of the Therapeutic Lifestyle Changes diet. Pt wants to lose wt. Wt loss  tips reviewed. Pt is diabetic. Last A1c according to pt was 6.0. Pt reports her A1c tends to vary from 5.7 to 6.2. Pt's states her MD changed her Janumet to 2 times/day during the week and 1 time/day during the weekend. Pt checks CBG's 2-3 times a week. Pt expressed understanding of the information reviewed. Pt aware of nutrition education classes offered.  Nutrition Diagnosis ? Food-and nutrition-related knowledge deficit related to lack of exposure to information as related to diagnosis of: ? CVD ? DM  ? Obesity related to excessive energy intake as evidenced by a BMI of 30.5  Nutrition Intervention ? Pt's individual nutrition plan and goals reviewed with pt.  Nutrition Goal(s):  ? Pt to identify food quantities necessary to achieve weight loss of 6-15 lb at graduation from cardiac rehab.  Plan:  Pt to attend nutrition classes ? Nutrition I ? Nutrition II ? Portion Distortion ? Diabetes Blitz ? Diabetes Q & A Will provide client-centered nutrition education as part of interdisciplinary care.   Monitor and evaluate progress toward nutrition goal with team.  Derek Mound, M.Ed, RD, LDN,  CDE 02/25/2017 10:07 AM

## 2017-02-25 NOTE — Telephone Encounter (Signed)
-----   Message from Magda Kiel, RN sent at 02/24/2017 12:13 PM EST ----- Regarding: metoprolol Good morning [name] attended orientation today and completed her walk test. I wanted Dr Acie Fredrickson to know that Sheila Reynolds is taking 25 mg of metoprolol in the morning and 50 mg of metoprolol in the evening due to complains of fatigue.  [name] has 50 mg of metoprolol prescribed twice a day.  [name] has been taking the decreased dose for a while.  Thanks for your assistance,  Sincerely,  Barnet Pall

## 2017-02-26 DIAGNOSIS — Z1231 Encounter for screening mammogram for malignant neoplasm of breast: Secondary | ICD-10-CM | POA: Diagnosis not present

## 2017-02-26 DIAGNOSIS — E2839 Other primary ovarian failure: Secondary | ICD-10-CM | POA: Diagnosis not present

## 2017-02-26 DIAGNOSIS — Z1389 Encounter for screening for other disorder: Secondary | ICD-10-CM | POA: Diagnosis not present

## 2017-02-26 DIAGNOSIS — Z1211 Encounter for screening for malignant neoplasm of colon: Secondary | ICD-10-CM | POA: Diagnosis not present

## 2017-02-26 DIAGNOSIS — E119 Type 2 diabetes mellitus without complications: Secondary | ICD-10-CM | POA: Diagnosis not present

## 2017-03-02 ENCOUNTER — Encounter (HOSPITAL_COMMUNITY)
Admission: RE | Admit: 2017-03-02 | Discharge: 2017-03-02 | Disposition: A | Discharge status: Home / Self Care | Payer: Medicare Other | Source: Ambulatory Visit | Attending: Cardiovascular Disease | Admitting: Cardiovascular Disease

## 2017-03-02 ENCOUNTER — Ambulatory Visit (HOSPITAL_BASED_OUTPATIENT_CLINIC_OR_DEPARTMENT_OTHER): Payer: Medicare Other | Attending: Internal Medicine | Admitting: Radiology

## 2017-03-02 DIAGNOSIS — Z79899 Other long term (current) drug therapy: Secondary | ICD-10-CM | POA: Diagnosis not present

## 2017-03-02 DIAGNOSIS — K219 Gastro-esophageal reflux disease without esophagitis: Secondary | ICD-10-CM | POA: Diagnosis not present

## 2017-03-02 DIAGNOSIS — Z7901 Long term (current) use of anticoagulants: Secondary | ICD-10-CM | POA: Diagnosis not present

## 2017-03-02 DIAGNOSIS — Z87891 Personal history of nicotine dependence: Secondary | ICD-10-CM | POA: Diagnosis not present

## 2017-03-02 DIAGNOSIS — J45909 Unspecified asthma, uncomplicated: Secondary | ICD-10-CM | POA: Diagnosis not present

## 2017-03-02 DIAGNOSIS — I10 Essential (primary) hypertension: Secondary | ICD-10-CM | POA: Diagnosis not present

## 2017-03-02 DIAGNOSIS — E119 Type 2 diabetes mellitus without complications: Secondary | ICD-10-CM | POA: Diagnosis not present

## 2017-03-02 DIAGNOSIS — G4733 Obstructive sleep apnea (adult) (pediatric): Secondary | ICD-10-CM

## 2017-03-02 DIAGNOSIS — Z955 Presence of coronary angioplasty implant and graft: Secondary | ICD-10-CM

## 2017-03-02 DIAGNOSIS — Z7982 Long term (current) use of aspirin: Secondary | ICD-10-CM | POA: Diagnosis not present

## 2017-03-02 DIAGNOSIS — M797 Fibromyalgia: Secondary | ICD-10-CM | POA: Diagnosis not present

## 2017-03-02 DIAGNOSIS — J449 Chronic obstructive pulmonary disease, unspecified: Secondary | ICD-10-CM | POA: Diagnosis not present

## 2017-03-02 DIAGNOSIS — Z48812 Encounter for surgical aftercare following surgery on the circulatory system: Secondary | ICD-10-CM | POA: Diagnosis not present

## 2017-03-02 DIAGNOSIS — F419 Anxiety disorder, unspecified: Secondary | ICD-10-CM | POA: Diagnosis not present

## 2017-03-02 DIAGNOSIS — D649 Anemia, unspecified: Secondary | ICD-10-CM | POA: Diagnosis not present

## 2017-03-02 DIAGNOSIS — M1990 Unspecified osteoarthritis, unspecified site: Secondary | ICD-10-CM | POA: Diagnosis not present

## 2017-03-02 DIAGNOSIS — I2510 Atherosclerotic heart disease of native coronary artery without angina pectoris: Secondary | ICD-10-CM | POA: Diagnosis not present

## 2017-03-02 DIAGNOSIS — E785 Hyperlipidemia, unspecified: Secondary | ICD-10-CM | POA: Diagnosis not present

## 2017-03-02 DIAGNOSIS — Z79891 Long term (current) use of opiate analgesic: Secondary | ICD-10-CM | POA: Diagnosis not present

## 2017-03-02 DIAGNOSIS — Z9861 Coronary angioplasty status: Secondary | ICD-10-CM | POA: Diagnosis not present

## 2017-03-02 LAB — GLUCOSE, CAPILLARY
GLUCOSE-CAPILLARY: 101 mg/dL — AB (ref 65–99)
Glucose-Capillary: 144 mg/dL — ABNORMAL HIGH (ref 65–99)
Glucose-Capillary: 71 mg/dL (ref 65–99)

## 2017-03-02 NOTE — Progress Notes (Signed)
Daily Session Note  Patient Details  Name: Sheila Reynolds MRN: [id number] Date of Birth: 06/05/1945 Referring Provider:     CARDIAC REHAB PHASE II ORIENTATION from 02/24/2017 in [facility]  Referring Provider  Mertie Moores MD      Encounter Date: 03/02/2017  Check In: Session Check In - 03/02/17 1129      Check-In   Location  [facility]    Staff Present  Barnet Pall, RN, BSN;Joann Rion, RN, BSN;Molly diVincenzo, MS, ACSM RCEP, Exercise Physiologist;Olinty Odenton, MS, ACSM CEP, Exercise Physiologist;Tyara Carol Ada, MS,ACSM CEP, Exercise Physiologist    Supervising physician immediately available to respond to emergencies  [facility] immediately available    Physician(s)  Dr. Cathlean Sauer    Medication changes reported      No    Fall or balance concerns reported     No    Tobacco Cessation  No Change    Warm-up and Cool-down  Performed as group-led instruction    Resistance Training Performed  Yes    VAD Patient?  No      Pain Assessment   Currently in Pain?  No/denies    Multiple Pain Sites  No       Capillary Blood Glucose: Results for orders placed or performed during the hospital encounter of 03/02/17 (from the past 24 hour(s))  Glucose, capillary     Status: Abnormal   Collection Time: 03/02/17 11:20 AM  Result Value Ref Range   Glucose-Capillary 144 (H) 65 - 99 mg/dL  Glucose, capillary     Status: None   Collection Time: 03/02/17 12:09 PM  Result Value Ref Range   Glucose-Capillary 71 65 - 99 mg/dL  Glucose, capillary     Status: Abnormal   Collection Time: 03/02/17 12:34 PM  Result Value Ref Range   Glucose-Capillary 101 (H) 65 - 99 mg/dL      Social History   Tobacco Use  Smoking Status Former Smoker  . Packs/day: 1.00  . Years: 4.00  . Pack years: 4.00  . Types: Cigarettes  . Last attempt to quit: 02/11/1980  . Years since quitting: 37.0  Smokeless Tobacco Never Used    Goals Met:   Exercise tolerated well  Goals Unmet:  Not Applicable  Comments: Pt started cardiac rehab today.  Pt tolerated light exercise without difficulty. VSS, telemetry-Sinus Rhtyhm, asymptomatic.  Medication list reconciled. Pt denies barriers to medicaiton compliance.  PSYCHOSOCIAL ASSESSMENT:  PHQ-0. Pt exhibits positive coping skills, hopeful outlook with supportive family. No psychosocial needs identified at this time, no psychosocial interventions necessary.    Pt enjoys going to the [employer].   Pt oriented to exercise equipment and routine.    Understanding verbalized. Pre exercise CBG 144. Post exercise CBG 71. Patient asymptomatic. Patient was given lemonade and graham crackers. Recheck CBG 101. Vee is going home to eat lunch.Will continue to monitor the patient throughout  the program.Asaad Gulley Venetia Maxon, RN,BSN 03/02/2017 12:44 PM    Dr. Fransico Him is Medical Director for Cardiac Rehab at [facility].

## 2017-03-03 NOTE — Progress Notes (Signed)
Cardiac Individual Treatment Plan  Patient Details  Name: Sheila Reynolds MRN: [id number] Date of Birth: [birth year] Referring Provider:     CARDIAC REHAB PHASE II ORIENTATION from 02/24/2017 in [place]  Referring Provider  Mertie Moores MD      Initial Encounter Date:    CARDIAC REHAB PHASE II ORIENTATION from 02/24/2017 in [place]  Date  02/24/17  Referring Provider  [name], Arnette Norris MD      Visit Diagnosis: Status post coronary artery stent placement  Patient's Home Medications on Admission:  Current Outpatient Medications:  .  ketorolac (ACULAR) 0.5 % ophthalmic solution, Place 1 drop into the left eye 4 (four) times daily., Disp: , Rfl:  .  loteprednol (LOTEMAX) 0.5 % ophthalmic suspension, Place 3-4 drops into both eyes See admin instructions. Instill 4 drops into the left eye once daily, and 3 drops in the right eye once daily, Disp: , Rfl:  .  Menthol, Topical Analgesic, (BIOFREEZE EX), Apply 1 application topically daily as needed (foot pain)., Disp: , Rfl:  .  metoprolol tartrate (LOPRESSOR) 50 MG tablet, Patient takes 25 mg in the morning and 50 mg in the evening, Disp: 180 tablet, Rfl: 3 .  montelukast (SINGULAIR) 10 MG tablet, Take 10 mg by mouth at bedtime., Disp: , Rfl:  .  Multiple Vitamins-Minerals (MULTIVITAMIN ADULTS PO), Take by mouth., Disp: , Rfl:  .  nitroGLYCERIN (NITROSTAT) 0.4 MG SL tablet, Place 1 tablet (0.4 mg total) under the tongue every 5 (five) minutes as needed for chest pain., Disp: 25 tablet, Rfl: prn .  olmesartan (BENICAR) 20 MG tablet, Take 20 mg by mouth daily., Disp: , Rfl:  .  pantoprazole (PROTONIX) 40 MG tablet, Take 1 tablet (40 mg total) by mouth daily., Disp: 30 tablet, Rfl: 6 .  pregabalin (LYRICA) 75 MG capsule, Take 75 mg by mouth at bedtime., Disp: , Rfl:  .  PROAIR HFA 108 (90 Base) MCG/ACT inhaler, Inhale 2 puffs into the lungs every 6 (six) hours as needed for  wheezing., Disp: 1 Inhaler, Rfl: 12 .  sitaGLIPtin-metformin (JANUMET) 50-500 MG tablet, Take 1 tablet by mouth as directed. Take 1 tablet twice daily on Monday through Friday, take 1 tablet daily on Saturdays and Sundays, Disp: , Rfl:  .  timolol (BETIMOL) 0.5 % ophthalmic solution, Place 1 drop into the left eye 2 (two) times daily., Disp: , Rfl:  .  tolnaftate (TINACTIN) 1 % cream, Apply 1 application topically daily as needed (foot itching)., Disp: , Rfl:  .  traMADol (ULTRAM) 50 MG tablet, Take 1 tablet (50 mg total) by mouth every 6 (six) hours as needed., Disp: 40 tablet, Rfl: 0 .  travoprost, benzalkonium, (TRAVATAN) 0.004 % ophthalmic solution, Place 1 drop into both eyes at bedtime., Disp: , Rfl:  .  Turmeric 500 MG TABS, Take 500 mg by mouth daily., Disp: , Rfl:  .  acetaminophen (TYLENOL) 500 MG tablet, Take 1,000 mg by mouth 2 (two) times daily as needed for moderate pain or headache., Disp: , Rfl:  .  albuterol (PROVENTIL) (2.5 MG/3ML) 0.083% nebulizer solution, Take 2.5 mg by nebulization daily as needed for wheezing or shortness of breath. , Disp: , Rfl:  .  aspirin EC 81 MG tablet, Take 81 mg by mouth at bedtime. , Disp: , Rfl:  .  atorvastatin (LIPITOR) 40 MG tablet, Take 1 tablet (40 mg total) by mouth daily., Disp: 30 tablet, Rfl: 6 .  benzonatate (  TESSALON) 200 MG capsule, Take 1 capsule (200 mg total) by mouth 3 (three) times daily as needed for cough., Disp: 30 capsule, Rfl: 1 .  brimonidine (ALPHAGAN P) 0.1 % SOLN, Place 1 drop into the left eye 3 (three) times daily. , Disp: , Rfl:  .  brinzolamide (AZOPT) 1 % ophthalmic suspension, Place 1 drop into the left eye 3 (three) times daily. , Disp: , Rfl:  .  clopidogrel (PLAVIX) 75 MG tablet, Take 1 tablet (75 mg total) by mouth daily., Disp: 30 tablet, Rfl: 11 .  furosemide (LASIX) 20 MG tablet, Take 1 tablet (20 mg total) by mouth every 3 (three) days., Disp: 30 tablet, Rfl: 5 .  guaiFENesin (ROBITUSSIN) 100 MG/5ML SOLN, Take 5  mLs by mouth every 6 (six) hours as needed for cough or to loosen phlegm. , Disp: , Rfl:  .  isosorbide mononitrate (IMDUR) 30 MG 24 hr tablet, Take 1 tablet (30 mg total) by mouth daily., Disp: 30 tablet, Rfl: 11  Past Medical History: Past Medical History:  Diagnosis Date  . Anemia    3 months ago anemic  . Anxiety    on meds  . Arthritis    "all over" (01/15/2017)  . Asthma   . Bronchitis with emphysema   . Chest pain   . Chronic bronchitis ([place])   . Coronary artery disease    Mild to moderate CAD  . Dysrhythmia    has fluttering at times  . Family history of anesthesia complication    daughter N/V  . Fibromyalgia   . GERD (gastroesophageal reflux disease)    on meds  . Headache(784.0)   . Heart murmur   . History of hiatal hernia   . Hx of echocardiogram    Echo (03/2013):  Tech limited; Mild focal basal septal hypertrophy, EF 60-65%, normal RVF  . Hyperlipidemia   . Hypertension   . Nonsustained ventricular tachycardia ([place])   . OSA on CPAP   . Palpitations   . PVC (premature ventricular contraction)    a. Holter 12/16: NSR, occ PAC,PVCs  . Shortness of breath    loses breathe at times and have to take extra breathes  . Type II diabetes mellitus ([facility])     Tobacco Use: Social History   Tobacco Use  Smoking Status Former Smoker  . Packs/day: 1.00  . Years: 4.00  . Pack years: 4.00  . Types: Cigarettes  . Last attempt to quit: 02/11/1980  . Years since quitting: 37.0  Smokeless Tobacco Never Used    Labs: Recent Review Flowsheet Data    Labs for ITP Cardiac and Pulmonary Rehab Latest Ref Rng & Units 09/01/2012 05/24/2014 05/07/2016 12/12/2016 12/12/2016   Cholestrol 100 - 199 mg/dL 112 157 133 - -   LDLCALC 0 - 99 mg/dL 58 85 67 - -   HDL >39 mg/dL 37.70(L) 51.70 50 - -   Trlycerides 0 - 149 mg/dL 83.0 104.0 80 - -   PHART 7.350 - 7.450 - - - - 7.395   PCO2ART 32.0 - 48.0 mmHg - - - - 40.7   HCO3 20.0 - 28.0 mmol/L - - - 26.7 24.9   TCO2 22 - 32 mmol/L - - -  28 26   O2SAT % - - - 71.0 97.0      Capillary Blood Glucose: Lab Results  Component Value Date   GLUCAP 99 03/04/2017   GLUCAP 117 (H) 03/04/2017   GLUCAP 101 (H) 03/02/2017   GLUCAP 71  03/02/2017   GLUCAP 144 (H) 03/02/2017     Exercise Target Goals:    Exercise Program Goal: Individual exercise prescription set using results from initial 6 min walk test and THRR while considering  patient's activity barriers and safety.   Exercise Prescription Goal: Initial exercise prescription builds to 30-45 minutes a day of aerobic activity, 2-3 days per week.  Home exercise guidelines will be given to patient during program as part of exercise prescription that the participant will acknowledge.  Activity Barriers & Risk Stratification: Activity Barriers & Cardiac Risk Stratification - 02/24/17 0957      Activity Barriers & Cardiac Risk Stratification   Activity Barriers  Arthritis;Joint Problems;Deconditioning;Muscular Weakness;Shortness of Breath;Other (comment)    Comments  R shoulder/arm pain; L shoulder limitation    Cardiac Risk Stratification  High       6 Minute Walk: 6 Minute Walk    Row Name 02/24/17 1056         6 Minute Walk   Phase  Initial     Distance  1270 feet     Walk Time  6 minutes     # of Rest Breaks  0     MPH  2.4     METS  2.8     RPE  11     VO2 Peak  9.8     Symptoms  Yes (comment)     Comments  knee pain, neck pain an d R arm pain (denied chest pain)     Resting HR  65 bpm     Resting BP  122/60     Resting Oxygen Saturation   98 %     Exercise Oxygen Saturation  during 6 min walk  98 %     Max Ex. HR  105 bpm     Max Ex. BP  144/80     2 Minute Post BP  122/84        Oxygen Initial Assessment:   Oxygen Re-Evaluation:   Oxygen Discharge (Final Oxygen Re-Evaluation):   Initial Exercise Prescription: Initial Exercise Prescription - 02/24/17 1000      Date of Initial Exercise RX and Referring Provider   Date  02/24/17     Referring Provider  [name], Philip MD      Treadmill   MPH  2    Grade  0    Minutes  10    METs  2.53      Recumbant Bike   Level  1.5    Minutes  10    METs  1.5      NuStep   Level  2    SPM  60    Minutes  10    METs  1.7      Prescription Details   Frequency (times per week)  3    Duration  Progress to 30 minutes of continuous aerobic without signs/symptoms of physical distress      Intensity   THRR 40-80% of Max Heartrate  60-129    Ratings of Perceived Exertion  11-15    Perceived Dyspnea  0-4      Progression   Progression  Continue to progress workloads to maintain intensity without signs/symptoms of physical distress.      Resistance Training   Training Prescription  Yes    Weight  1lb    Reps  10-15       Perform Capillary Blood Glucose checks as needed.  Exercise Prescription Changes:  Exercise  Prescription Changes    Row Name 03/02/17 1600             Response to Exercise   Blood Pressure (Admit)  122/68       Blood Pressure (Exercise)  162/82       Blood Pressure (Exit)  140/60       Heart Rate (Admit)  73 bpm       Heart Rate (Exercise)  120 bpm       Heart Rate (Exit)  65 bpm       Rating of Perceived Exertion (Exercise)  14       Perceived Dyspnea (Exercise)  0       Comments  Pt is uncomfortable on Treadmill. Will have patient walk on track moving forward.       Duration  Progress to 30 minutes of  aerobic without signs/symptoms of physical distress       Intensity  THRR New         Progression   Progression  Continue to progress workloads to maintain intensity without signs/symptoms of physical distress.       Average METs  2.1         Resistance Training   Training Prescription  No         Treadmill   MPH  1.7       Grade  0       Minutes  5       METs  2.3         Recumbant Bike   Level  1.5       Minutes  10       METs  2         NuStep   Level  2       SPM  60       Minutes  10       METs  2.2           Exercise Comments:  Exercise Comments    Row Name 03/02/17 1651           Exercise Comments  Pt oriented to exericse equipement. Patient is off to a good start. Will continue to monitor and progress patient.           Exercise Goals and Review:  Exercise Goals    Row Name 02/24/17 0954 02/24/17 0959 02/24/17 1102         Exercise Goals   Increase Physical Activity  Yes  -  -     Intervention  Provide advice, education, support and counseling about physical activity/exercise needs.;Develop an individualized exercise prescription for aerobic and resistive training based on initial evaluation findings, risk stratification, comorbidities and participant's personal goals.  -  -     Expected Outcomes  Achievement of increased cardiorespiratory fitness and enhanced flexibility, muscular endurance and strength shown through measurements of functional capacity and personal statement of participant.  -  -     Increase Strength and Stamina  Yes  - be able to climb stairs without SOB, improve energy levels and desire to exercise  Yes     Intervention  Provide advice, education, support and counseling about physical activity/exercise needs.;Develop an individualized exercise prescription for aerobic and resistive training based on initial evaluation findings, risk stratification, comorbidities and participant's personal goals.  -  -     Expected Outcomes  Achievement of increased cardiorespiratory fitness and enhanced flexibility, muscular endurance and strength shown through measurements of  functional capacity and personal statement of participant.  -  -     Able to understand and use rate of perceived exertion (RPE) scale  Yes  -  -     Intervention  Provide education and explanation on how to use RPE scale  -  -     Expected Outcomes  Short Term: Able to use RPE daily in rehab to express subjective intensity level;Long Term:  Able to use RPE to guide intensity level when exercising  independently  -  -     Knowledge and understanding of Target Heart Rate Range (THRR)  Yes  -  -     Intervention  Provide education and explanation of THRR including how the numbers were predicted and where they are located for reference  -  -     Expected Outcomes  Long Term: Able to use THRR to govern intensity when exercising independently;Short Term: Able to state/look up THRR;Short Term: Able to use daily as guideline for intensity in rehab  -  -     Able to check pulse independently  Yes  -  -     Intervention  Provide education and demonstration on how to check pulse in carotid and radial arteries.;Review the importance of being able to check your own pulse for safety during independent exercise  -  -     Expected Outcomes  Short Term: Able to explain why pulse checking is important during independent exercise;Long Term: Able to check pulse independently and accurately  -  -     Understanding of Exercise Prescription  Yes  -  -     Intervention  Provide education, explanation, and written materials on patient's individual exercise prescription  -  -     Expected Outcomes  Short Term: Able to explain program exercise prescription;Long Term: Able to explain home exercise prescription to exercise independently  -  -        Exercise Goals Re-Evaluation :    Discharge Exercise Prescription (Final Exercise Prescription Changes): Exercise Prescription Changes - 03/02/17 1600      Response to Exercise   Blood Pressure (Admit)  122/68    Blood Pressure (Exercise)  162/82    Blood Pressure (Exit)  140/60    Heart Rate (Admit)  73 bpm    Heart Rate (Exercise)  120 bpm    Heart Rate (Exit)  65 bpm    Rating of Perceived Exertion (Exercise)  14    Perceived Dyspnea (Exercise)  0    Comments  Pt is uncomfortable on Treadmill. Will have patient walk on track moving forward.    Duration  Progress to 30 minutes of  aerobic without signs/symptoms of physical distress    Intensity  THRR New       Progression   Progression  Continue to progress workloads to maintain intensity without signs/symptoms of physical distress.    Average METs  2.1      Resistance Training   Training Prescription  No      Treadmill   MPH  1.7    Grade  0    Minutes  5    METs  2.3      Recumbant Bike   Level  1.5    Minutes  10    METs  2      NuStep   Level  2    SPM  60    Minutes  10    METs  2.2  Nutrition:  Target Goals: Understanding of nutrition guidelines, daily intake of sodium 1500mg , cholesterol 200mg , calories 30% from fat and 7% or less from saturated fats, daily to have 5 or more servings of fruits and vegetables.  Biometrics: Pre Biometrics - 02/24/17 1102      Pre Biometrics   Height  5\' 2"  (1.575 m)    Weight  166 lb 14.2 oz (75.7 kg)    Waist Circumference  38.5 inches    Hip Circumference  41.5 inches    Waist to Hip Ratio  0.93 %    BMI (Calculated)  30.52    Triceps Skinfold  47 mm    % Body Fat  45 %    Grip Strength  22 kg    Flexibility  0 in    Single Leg Stand  3.21 seconds        Nutrition Therapy Plan and Nutrition Goals: Nutrition Therapy & Goals - 02/25/17 1117      Nutrition Therapy   Diet  Carb Modified, Heart Healthy      Personal Nutrition Goals   Nutrition Goal  Pt to identify food quantities necessary to achieve weight loss of 6-15 lb at graduation from cardiac rehab.      Intervention Plan   Intervention  Prescribe, educate and counsel regarding individualized specific dietary modifications aiming towards targeted core components such as weight, hypertension, lipid management, diabetes, heart failure and other comorbidities.    Expected Outcomes  Short Term Goal: Understand basic principles of dietary content, such as calories, fat, sodium, cholesterol and nutrients.;Long Term Goal: Adherence to prescribed nutrition plan.       Nutrition Assessments: Nutrition Assessments - 02/25/17 1118      MEDFICTS Scores   Pre Score   30       Nutrition Goals Re-Evaluation:   Nutrition Goals Re-Evaluation:   Nutrition Goals Discharge (Final Nutrition Goals Re-Evaluation):   Psychosocial: Target Goals: Acknowledge presence or absence of significant depression and/or stress, maximize coping skills, provide positive support system. Participant is able to verbalize types and ability to use techniques and skills needed for reducing stress and depression.  Initial Review & Psychosocial Screening: Initial Psych Review & Screening - 02/24/17 1102      Initial Review   Current issues with  Current Anxiety/Panic health related anxiety      Family Dynamics   Good Support System?  Yes    Comments  spouse, children, friends       Barriers   Psychosocial barriers to participate in program  The patient should benefit from training in stress management and relaxation.      Screening Interventions   Interventions  Encouraged to exercise;Provide feedback about the scores to participant;To provide support and resources with identified psychosocial needs       Quality of Life Scores: Quality of Life - 02/24/17 1103      Quality of Life Scores   Health/Function Pre  18.7 %    Socioeconomic Pre  20.38 %    Psych/Spiritual Pre  23.14 %    Family Pre  22.8 %    GLOBAL Pre  20.56 %      Scores of 19 and below usually indicate a poorer quality of life in these areas.  A difference of  2-3 points is a clinically meaningful difference.  A difference of 2-3 points in the total score of the Quality of Life Index has been associated with significant improvement in overall quality of life, self-image,  physical symptoms, and general health in studies assessing change in quality of life.  PHQ-9: Recent Review Flowsheet Data    Depression screen [facility] 2/9 03/02/2017 01/27/2017   Decreased Interest 0 0   Down, Depressed, Hopeless 0 1    PHQ - 2 Score 0 1     Interpretation of Total Score  Total Score Depression Severity:  1-4 =  Minimal depression, 5-9 = Mild depression, 10-14 = Moderate depression, 15-19 = Moderately severe depression, 20-27 = Severe depression   Psychosocial Evaluation and Intervention:   Psychosocial Re-Evaluation:   Psychosocial Discharge (Final Psychosocial Re-Evaluation):   Vocational Rehabilitation: Provide vocational rehab assistance to qualifying candidates.   Vocational Rehab Evaluation & Intervention: Vocational Rehab - 02/24/17 1101      Initial Vocational Rehab Evaluation & Intervention   Assessment shows need for Vocational Rehabilitation  No returning to work       Education: Education Goals: Education classes will be provided on a weekly basis, covering required topics. Participant will state understanding/return demonstration of topics presented.  Learning Barriers/Preferences: Learning Barriers/Preferences - 02/24/17 1056      Learning Barriers/Preferences   Learning Barriers  Sight;Hearing       Education Topics: Count Your Pulse:  -Group instruction provided by verbal instruction, demonstration, patient participation and written materials to support subject.  Instructors address importance of being able to find your pulse and how to count your pulse when at home without a heart monitor.  Patients get hands on experience counting their pulse with staff help and individually.   Heart Attack, Angina, and Risk Factor Modification:  -Group instruction provided by verbal instruction, video, and written materials to support subject.  Instructors address signs and symptoms of angina and heart attacks.    Also discuss risk factors for heart disease and how to make changes to improve heart health risk factors.   Functional Fitness:  -Group instruction provided by verbal instruction, demonstration, patient participation, and written materials to support subject.  Instructors address safety measures for doing things around the house.  Discuss how to get up and down off the  floor, how to pick things up properly, how to safely get out of a chair without assistance, and balance training.   Meditation and Mindfulness:  -Group instruction provided by verbal instruction, patient participation, and written materials to support subject.  Instructor addresses importance of mindfulness and meditation practice to help reduce stress and improve awareness.  Instructor also leads participants through a meditation exercise.    Stretching for Flexibility and Mobility:  -Group instruction provided by verbal instruction, patient participation, and written materials to support subject.  Instructors lead participants through series of stretches that are designed to increase flexibility thus improving mobility.  These stretches are additional exercise for major muscle groups that are typically performed during regular warm up and cool down.   Hands Only CPR:  -Group verbal, video, and participation provides a basic overview of AHA guidelines for community CPR. Role-play of emergencies allow participants the opportunity to practice calling for help and chest compression technique with discussion of AED use.   Hypertension: -Group verbal and written instruction that provides a basic overview of hypertension including the most recent diagnostic guidelines, risk factor reduction with self-care instructions and medication management.    Nutrition I class: Heart Healthy Eating:  -Group instruction provided by PowerPoint slides, verbal discussion, and written materials to support subject matter. The instructor gives an explanation and review of the Therapeutic Lifestyle Changes diet recommendations, which  includes a discussion on lipid goals, dietary fat, sodium, fiber, plant stanol/sterol esters, sugar, and the components of a well-balanced, healthy diet.   Nutrition II class: Lifestyle Skills:  -Group instruction provided by PowerPoint slides, verbal discussion, and written materials  to support subject matter. The instructor gives an explanation and review of label reading, grocery shopping for heart health, heart healthy recipe modifications, and ways to make healthier choices when eating out.   Diabetes Question & Answer:  -Group instruction provided by PowerPoint slides, verbal discussion, and written materials to support subject matter. The instructor gives an explanation and review of diabetes co-morbidities, pre- and post-prandial blood glucose goals, pre-exercise blood glucose goals, signs, symptoms, and treatment of hypoglycemia and hyperglycemia, and foot care basics.   Diabetes Blitz:  -Group instruction provided by PowerPoint slides, verbal discussion, and written materials to support subject matter. The instructor gives an explanation and review of the physiology behind type 1 and type 2 diabetes, diabetes medications and rational behind using different medications, pre- and post-prandial blood glucose recommendations and Hemoglobin A1c goals, diabetes diet, and exercise including blood glucose guidelines for exercising safely.    Portion Distortion:  -Group instruction provided by PowerPoint slides, verbal discussion, written materials, and food models to support subject matter. The instructor gives an explanation of serving size versus portion size, changes in portions sizes over the last 20 years, and what consists of a serving from each food group.   Stress Management:  -Group instruction provided by verbal instruction, video, and written materials to support subject matter.  Instructors review role of stress in heart disease and how to cope with stress positively.     Exercising on Your Own:  -Group instruction provided by verbal instruction, power point, and written materials to support subject.  Instructors discuss benefits of exercise, components of exercise, frequency and intensity of exercise, and end points for exercise.  Also discuss use of  nitroglycerin and activating EMS.  Review options of places to exercise outside of rehab.  Review guidelines for sex with heart disease.   Cardiac Drugs I:  -Group instruction provided by verbal instruction and written materials to support subject.  Instructor reviews cardiac drug classes: antiplatelets, anticoagulants, beta blockers, and statins.  Instructor discusses reasons, side effects, and lifestyle considerations for each drug class.   Cardiac Drugs II:  -Group instruction provided by verbal instruction and written materials to support subject.  Instructor reviews cardiac drug classes: angiotensin converting enzyme inhibitors (ACE-I), angiotensin II receptor blockers (ARBs), nitrates, and calcium channel blockers.  Instructor discusses reasons, side effects, and lifestyle considerations for each drug class.   Anatomy and Physiology of the Circulatory System:  Group verbal and written instruction and models provide basic cardiac anatomy and physiology, with the coronary electrical and arterial systems. Review of: AMI, Angina, Valve disease, Heart Failure, Peripheral Artery Disease, Cardiac Arrhythmia, Pacemakers, and the ICD.   Other Education:  -Group or individual verbal, written, or video instructions that support the educational goals of the cardiac rehab program.   Knowledge Questionnaire Score: Knowledge Questionnaire Score - 02/24/17 1101      Knowledge Questionnaire Score   Pre Score  20/24       Core Components/Risk Factors/Patient Goals at Admission: Personal Goals and Risk Factors at Admission - 02/24/17 1103      Core Components/Risk Factors/Patient Goals on Admission    Weight Management  Yes;Obesity;Weight Maintenance;Weight Loss    Intervention  Weight Management: Develop a combined nutrition and exercise program designed to  reach desired caloric intake, while maintaining appropriate intake of nutrient and fiber, sodium and fats, and appropriate energy expenditure  required for the weight goal.;Weight Management: Provide education and appropriate resources to help participant work on and attain dietary goals.;Weight Management/Obesity: Establish reasonable short term and long term weight goals.;Obesity: Provide education and appropriate resources to help participant work on and attain dietary goals.    Admit Weight  166 lb 14.2 oz (75.7 kg)    Goal Weight: Short Term  160 lb (72.6 kg)    Goal Weight: Long Term  150 lb (68 kg)    Expected Outcomes  Short Term: Continue to assess and modify interventions until short term weight is achieved;Long Term: Adherence to nutrition and physical activity/exercise program aimed toward attainment of established weight goal;Weight Maintenance: Understanding of the daily nutrition guidelines, which includes 25-35% calories from fat, 7% or less cal from saturated fats, less than 200mg  cholesterol, less than 1.5gm of sodium, & 5 or more servings of fruits and vegetables daily;Weight Loss: Understanding of general recommendations for a balanced deficit meal plan, which promotes 1-2 lb weight loss per week and includes a negative energy balance of [phone] kcal/d;Understanding recommendations for meals to include 15-35% energy as protein, 25-35% energy from fat, 35-60% energy from carbohydrates, less than 200mg  of dietary cholesterol, 20-35 gm of total fiber daily;Understanding of distribution of calorie intake throughout the day with the consumption of 4-5 meals/snacks    Improve shortness of breath with ADL's  Yes    Intervention  Provide education, individualized exercise plan and daily activity instruction to help decrease symptoms of SOB with activities of daily living.    Expected Outcomes  Short Term: Achieves a reduction of symptoms when performing activities of daily living.    Diabetes  Yes    Intervention  Provide education about signs/symptoms and action to take for hypo/hyperglycemia.;Provide education about proper  nutrition, including hydration, and aerobic/resistive exercise prescription along with prescribed medications to achieve blood glucose in normal ranges: Fasting glucose 65-99 mg/dL    Expected Outcomes  Long Term: Attainment of HbA1C < 7%.;Short Term: Participant verbalizes understanding of the signs/symptoms and immediate care of hyper/hypoglycemia, proper foot care and importance of medication, aerobic/resistive exercise and nutrition plan for blood glucose control.    Hypertension  Yes    Intervention  Provide education on lifestyle modifcations including regular physical activity/exercise, weight management, moderate sodium restriction and increased consumption of fresh fruit, vegetables, and low fat dairy, alcohol moderation, and smoking cessation.;Monitor prescription use compliance.    Expected Outcomes  Short Term: Continued assessment and intervention until BP is < 140/28mm HG in hypertensive participants. < 130/64mm HG in hypertensive participants with diabetes, heart failure or chronic kidney disease.;Long Term: Maintenance of blood pressure at goal levels.    Lipids  Yes    Intervention  Provide education and support for participant on nutrition & aerobic/resistive exercise along with prescribed medications to achieve LDL 70mg , HDL >40mg .    Expected Outcomes  Short Term: Participant states understanding of desired cholesterol values and is compliant with medications prescribed. Participant is following exercise prescription and nutrition guidelines.;Long Term: Cholesterol controlled with medications as prescribed, with individualized exercise RX and with personalized nutrition plan. Value goals: LDL < 70mg , HDL > 40 mg.    Stress  Yes    Intervention  Offer individual and/or small group education and counseling on adjustment to heart disease, stress management and health-related lifestyle change. Teach and support self-help strategies.;Refer participants experiencing significant psychosocial  distress to appropriate mental health specialists for further evaluation and treatment. When possible, include family members and significant others in education/counseling sessions.    Expected Outcomes  Short Term: Participant demonstrates changes in health-related behavior, relaxation and other stress management skills, ability to obtain effective social support, and compliance with psychotropic medications if prescribed.;Long Term: Emotional wellbeing is indicated by absence of clinically significant psychosocial distress or social isolation.       Core Components/Risk Factors/Patient Goals Review:    Core Components/Risk Factors/Patient Goals at Discharge (Final Review):    ITP Comments: ITP Comments    Row Name 02/24/17 0940           ITP Comments  Dr. Fransico Him, Medical Director          Comments: [place] is off to a good start to exercise after completing 2 sessions. Will continue to monitor CBG's.Barnet Pall, RN,BSN 03/04/2017 2:16 PM

## 2017-03-04 ENCOUNTER — Encounter (HOSPITAL_COMMUNITY)
Admission: RE | Admit: 2017-03-04 | Discharge: 2017-03-04 | Disposition: A | Discharge status: Home / Self Care | Payer: Medicare Other | Source: Ambulatory Visit | Attending: Cardiovascular Disease | Admitting: Cardiovascular Disease

## 2017-03-04 DIAGNOSIS — Z9861 Coronary angioplasty status: Secondary | ICD-10-CM | POA: Diagnosis not present

## 2017-03-04 DIAGNOSIS — Z955 Presence of coronary angioplasty implant and graft: Secondary | ICD-10-CM

## 2017-03-04 DIAGNOSIS — Z48812 Encounter for surgical aftercare following surgery on the circulatory system: Secondary | ICD-10-CM | POA: Diagnosis not present

## 2017-03-04 DIAGNOSIS — M1990 Unspecified osteoarthritis, unspecified site: Secondary | ICD-10-CM | POA: Diagnosis not present

## 2017-03-04 DIAGNOSIS — F419 Anxiety disorder, unspecified: Secondary | ICD-10-CM | POA: Diagnosis not present

## 2017-03-04 DIAGNOSIS — D649 Anemia, unspecified: Secondary | ICD-10-CM | POA: Diagnosis not present

## 2017-03-04 DIAGNOSIS — J45909 Unspecified asthma, uncomplicated: Secondary | ICD-10-CM | POA: Diagnosis not present

## 2017-03-04 LAB — GLUCOSE, CAPILLARY
Glucose-Capillary: 117 mg/dL — ABNORMAL HIGH (ref 65–99)
Glucose-Capillary: 99 mg/dL (ref 65–99)

## 2017-03-04 NOTE — Progress Notes (Signed)
Pre exercise CBG 117. Post exercise CBG 99. Patient asymptomatic. Dr Delorse Lek office called and notified about  Today's CBG's and Monday's. [place] from Dr Dr Lynder Parents office called and said that Sheila Reynolds can decrease her Janument dose. I asked Dr Lynder Parents office to call Sheila Reynolds's office with specific instructions.Barnet Pall, RN,BSN 03/04/2017 2:36 PM

## 2017-03-05 ENCOUNTER — Other Ambulatory Visit: Payer: Self-pay | Admitting: Internal Medicine

## 2017-03-05 ENCOUNTER — Other Ambulatory Visit: Payer: Self-pay | Admitting: Orthopedic Surgery

## 2017-03-05 DIAGNOSIS — E2839 Other primary ovarian failure: Secondary | ICD-10-CM

## 2017-03-05 DIAGNOSIS — M25511 Pain in right shoulder: Secondary | ICD-10-CM | POA: Diagnosis not present

## 2017-03-05 DIAGNOSIS — M542 Cervicalgia: Secondary | ICD-10-CM | POA: Diagnosis not present

## 2017-03-06 ENCOUNTER — Encounter (HOSPITAL_COMMUNITY)
Admission: RE | Admit: 2017-03-06 | Discharge: 2017-03-06 | Disposition: A | Discharge status: Home / Self Care | Payer: Medicare Other | Source: Ambulatory Visit | Attending: Cardiovascular Disease | Admitting: Cardiovascular Disease

## 2017-03-06 DIAGNOSIS — Z48812 Encounter for surgical aftercare following surgery on the circulatory system: Secondary | ICD-10-CM | POA: Diagnosis not present

## 2017-03-06 DIAGNOSIS — D649 Anemia, unspecified: Secondary | ICD-10-CM | POA: Diagnosis not present

## 2017-03-06 DIAGNOSIS — F419 Anxiety disorder, unspecified: Secondary | ICD-10-CM | POA: Diagnosis not present

## 2017-03-06 DIAGNOSIS — J45909 Unspecified asthma, uncomplicated: Secondary | ICD-10-CM | POA: Diagnosis not present

## 2017-03-06 DIAGNOSIS — Z9861 Coronary angioplasty status: Secondary | ICD-10-CM | POA: Diagnosis not present

## 2017-03-06 DIAGNOSIS — M1990 Unspecified osteoarthritis, unspecified site: Secondary | ICD-10-CM | POA: Diagnosis not present

## 2017-03-06 DIAGNOSIS — Z955 Presence of coronary angioplasty implant and graft: Secondary | ICD-10-CM

## 2017-03-06 LAB — GLUCOSE, CAPILLARY
GLUCOSE-CAPILLARY: 141 mg/dL — AB (ref 65–99)
Glucose-Capillary: 95 mg/dL (ref 65–99)

## 2017-03-09 ENCOUNTER — Encounter (HOSPITAL_COMMUNITY)
Admission: RE | Admit: 2017-03-09 | Discharge: 2017-03-09 | Disposition: A | Discharge status: Home / Self Care | Payer: Medicare Other | Source: Ambulatory Visit | Attending: Cardiovascular Disease | Admitting: Cardiovascular Disease

## 2017-03-09 DIAGNOSIS — M1990 Unspecified osteoarthritis, unspecified site: Secondary | ICD-10-CM | POA: Diagnosis not present

## 2017-03-09 DIAGNOSIS — Z9861 Coronary angioplasty status: Secondary | ICD-10-CM | POA: Diagnosis not present

## 2017-03-09 DIAGNOSIS — J45909 Unspecified asthma, uncomplicated: Secondary | ICD-10-CM | POA: Diagnosis not present

## 2017-03-09 DIAGNOSIS — Z48812 Encounter for surgical aftercare following surgery on the circulatory system: Secondary | ICD-10-CM | POA: Diagnosis not present

## 2017-03-09 DIAGNOSIS — Z955 Presence of coronary angioplasty implant and graft: Secondary | ICD-10-CM

## 2017-03-09 DIAGNOSIS — F419 Anxiety disorder, unspecified: Secondary | ICD-10-CM | POA: Diagnosis not present

## 2017-03-09 DIAGNOSIS — D649 Anemia, unspecified: Secondary | ICD-10-CM | POA: Diagnosis not present

## 2017-03-09 LAB — GLUCOSE, CAPILLARY
GLUCOSE-CAPILLARY: 126 mg/dL — AB (ref 65–99)
GLUCOSE-CAPILLARY: 98 mg/dL (ref 65–99)

## 2017-03-10 ENCOUNTER — Ambulatory Visit: Payer: Medicare Other | Admitting: Registered"

## 2017-03-11 ENCOUNTER — Encounter (HOSPITAL_COMMUNITY)
Admission: RE | Admit: 2017-03-11 | Discharge: 2017-03-11 | Disposition: A | Discharge status: Home / Self Care | Payer: Medicare Other | Source: Ambulatory Visit | Attending: Cardiovascular Disease | Admitting: Cardiovascular Disease

## 2017-03-11 DIAGNOSIS — J45909 Unspecified asthma, uncomplicated: Secondary | ICD-10-CM | POA: Diagnosis not present

## 2017-03-11 DIAGNOSIS — M1990 Unspecified osteoarthritis, unspecified site: Secondary | ICD-10-CM | POA: Diagnosis not present

## 2017-03-11 DIAGNOSIS — F419 Anxiety disorder, unspecified: Secondary | ICD-10-CM | POA: Diagnosis not present

## 2017-03-11 DIAGNOSIS — Z9861 Coronary angioplasty status: Secondary | ICD-10-CM | POA: Diagnosis not present

## 2017-03-11 DIAGNOSIS — Z955 Presence of coronary angioplasty implant and graft: Secondary | ICD-10-CM

## 2017-03-11 DIAGNOSIS — Z48812 Encounter for surgical aftercare following surgery on the circulatory system: Secondary | ICD-10-CM | POA: Diagnosis not present

## 2017-03-11 DIAGNOSIS — D649 Anemia, unspecified: Secondary | ICD-10-CM | POA: Diagnosis not present

## 2017-03-11 LAB — GLUCOSE, CAPILLARY
Glucose-Capillary: 119 mg/dL — ABNORMAL HIGH (ref 65–99)
Glucose-Capillary: 110 mg/dL — ABNORMAL HIGH (ref 65–99)

## 2017-03-13 ENCOUNTER — Encounter (HOSPITAL_COMMUNITY)
Admission: RE | Admit: 2017-03-13 | Discharge: 2017-03-13 | Disposition: A | Discharge status: Home / Self Care | Payer: Medicare Other | Source: Ambulatory Visit | Attending: Cardiovascular Disease | Admitting: Cardiovascular Disease

## 2017-03-13 DIAGNOSIS — K219 Gastro-esophageal reflux disease without esophagitis: Secondary | ICD-10-CM | POA: Insufficient documentation

## 2017-03-13 DIAGNOSIS — Z79891 Long term (current) use of opiate analgesic: Secondary | ICD-10-CM | POA: Diagnosis not present

## 2017-03-13 DIAGNOSIS — E119 Type 2 diabetes mellitus without complications: Secondary | ICD-10-CM | POA: Diagnosis not present

## 2017-03-13 DIAGNOSIS — E785 Hyperlipidemia, unspecified: Secondary | ICD-10-CM | POA: Insufficient documentation

## 2017-03-13 DIAGNOSIS — Z7982 Long term (current) use of aspirin: Secondary | ICD-10-CM | POA: Insufficient documentation

## 2017-03-13 DIAGNOSIS — J449 Chronic obstructive pulmonary disease, unspecified: Secondary | ICD-10-CM | POA: Diagnosis not present

## 2017-03-13 DIAGNOSIS — Z48812 Encounter for surgical aftercare following surgery on the circulatory system: Secondary | ICD-10-CM | POA: Diagnosis not present

## 2017-03-13 DIAGNOSIS — G4733 Obstructive sleep apnea (adult) (pediatric): Secondary | ICD-10-CM | POA: Insufficient documentation

## 2017-03-13 DIAGNOSIS — Z87891 Personal history of nicotine dependence: Secondary | ICD-10-CM | POA: Insufficient documentation

## 2017-03-13 DIAGNOSIS — Z79899 Other long term (current) drug therapy: Secondary | ICD-10-CM | POA: Diagnosis not present

## 2017-03-13 DIAGNOSIS — Z9861 Coronary angioplasty status: Secondary | ICD-10-CM | POA: Insufficient documentation

## 2017-03-13 DIAGNOSIS — J45909 Unspecified asthma, uncomplicated: Secondary | ICD-10-CM | POA: Diagnosis not present

## 2017-03-13 DIAGNOSIS — F419 Anxiety disorder, unspecified: Secondary | ICD-10-CM | POA: Diagnosis not present

## 2017-03-13 DIAGNOSIS — D649 Anemia, unspecified: Secondary | ICD-10-CM | POA: Insufficient documentation

## 2017-03-13 DIAGNOSIS — M1990 Unspecified osteoarthritis, unspecified site: Secondary | ICD-10-CM | POA: Diagnosis not present

## 2017-03-13 DIAGNOSIS — I2510 Atherosclerotic heart disease of native coronary artery without angina pectoris: Secondary | ICD-10-CM | POA: Diagnosis not present

## 2017-03-13 DIAGNOSIS — Z955 Presence of coronary angioplasty implant and graft: Secondary | ICD-10-CM

## 2017-03-13 DIAGNOSIS — Z7901 Long term (current) use of anticoagulants: Secondary | ICD-10-CM | POA: Insufficient documentation

## 2017-03-13 DIAGNOSIS — I10 Essential (primary) hypertension: Secondary | ICD-10-CM | POA: Diagnosis not present

## 2017-03-13 DIAGNOSIS — M797 Fibromyalgia: Secondary | ICD-10-CM | POA: Insufficient documentation

## 2017-03-13 LAB — GLUCOSE, CAPILLARY
GLUCOSE-CAPILLARY: 129 mg/dL — AB (ref 65–99)
Glucose-Capillary: 137 mg/dL — ABNORMAL HIGH (ref 65–99)

## 2017-03-16 ENCOUNTER — Encounter (HOSPITAL_COMMUNITY)
Admission: RE | Admit: 2017-03-16 | Discharge: 2017-03-16 | Disposition: A | Discharge status: Home / Self Care | Payer: Medicare Other | Source: Ambulatory Visit | Attending: Cardiovascular Disease | Admitting: Cardiovascular Disease

## 2017-03-16 DIAGNOSIS — F419 Anxiety disorder, unspecified: Secondary | ICD-10-CM | POA: Diagnosis not present

## 2017-03-16 DIAGNOSIS — Z955 Presence of coronary angioplasty implant and graft: Secondary | ICD-10-CM

## 2017-03-16 DIAGNOSIS — M1990 Unspecified osteoarthritis, unspecified site: Secondary | ICD-10-CM | POA: Diagnosis not present

## 2017-03-16 DIAGNOSIS — D649 Anemia, unspecified: Secondary | ICD-10-CM | POA: Diagnosis not present

## 2017-03-16 DIAGNOSIS — J45909 Unspecified asthma, uncomplicated: Secondary | ICD-10-CM | POA: Diagnosis not present

## 2017-03-16 DIAGNOSIS — Z48812 Encounter for surgical aftercare following surgery on the circulatory system: Secondary | ICD-10-CM | POA: Diagnosis not present

## 2017-03-16 DIAGNOSIS — Z9861 Coronary angioplasty status: Secondary | ICD-10-CM | POA: Diagnosis not present

## 2017-03-16 LAB — GLUCOSE, CAPILLARY
GLUCOSE-CAPILLARY: 121 mg/dL — AB (ref 65–99)
Glucose-Capillary: 84 mg/dL (ref 65–99)

## 2017-03-16 NOTE — Progress Notes (Signed)
Sheila Reynolds 72 y.o. female DOB: May 18, 1945 MRN: [id number]      Nutrition Note  1. Status post coronary artery stent placement    Meds reviewed. Janumet noted Labs:  CBG (last 3)  Recent Labs    03/16/17 1142 03/16/17 1208  GLUCAP 84 121*   Note Spoke with pt. Nutrition Plan and Nutrition Survey goals reviewed with pt. Pt is following a Heart Healthy diet. Pt wants to lose wt. Wt loss tips reviewed. Pt is diabetic. Pt checks CBG's 1-2 times a day now . Fasting CBG's reportedly 92-144 mg/dL. Pt has been holding Janumet on the mornings she has rehab. Pt ate a boiled egg, mac and cheese, green beans, and canned pears in light syrup at 10 am. Pt CBG 84 mg/dL after cardiac rehab warm-up and part of the first station. Pt expressed understanding of the information reviewed. Pt aware of nutrition education classes offered.  Nutrition Diagnosis ? Food-and nutrition-related knowledge deficit related to lack of exposure to information as related to diagnosis of: ? CVD ? DM  ? Obesity related to excessive energy intake as evidenced by a BMI of 30.5  Nutrition Intervention  Pt's individual nutrition plan reviewed with pt.  Benefits of adopting Heart Healthy diet discussed when [employer] reviewed.    Pt to try holding Janumet this week. Will notify pt's PCP of results if trial successful.  Pt given handouts for: ? Nutrition I class ? Nutrition II class ? Diabetes Blitz Class  Nutrition Goal(s):  ? Pt to identify food quantities necessary to achieve weight loss of 6-15 lb at graduation from cardiac rehab.  Plan:  Pt to attend nutrition classes ? Portion Distortion - met 03/11/17 ? Diabetes Q & A Will provide client-centered nutrition education as part of interdisciplinary care.   Monitor and evaluate progress toward nutrition goal with team.  Derek Mound, M.Ed, RD, LDN, CDE 03/16/2017 12:28 PM

## 2017-03-17 ENCOUNTER — Ambulatory Visit: Payer: Medicare Other | Admitting: Registered"

## 2017-03-17 ENCOUNTER — Other Ambulatory Visit: Payer: Medicare Other

## 2017-03-17 NOTE — Progress Notes (Signed)
Daily Session Note  Patient Details  Name: Sheila Reynolds MRN: [id number] Date of Birth: [birth year] Referring Provider:     CARDIAC REHAB PHASE II ORIENTATION from 02/24/2017 in [place]  Referring Provider  Mertie Moores MD      Encounter Date: 03/16/2017  Check In: Session Check In - 03/16/17 1124      Check-In   Location  [facility]    Staff Present  Sheila [place], MS, ACSM RCEP, Exercise Physiologist;Sheila [place], RN, [employer], RN, [employer], RN, [facility], MS, ACSM RCEP, Exercise Physiologist;Sheila [employer], MS,ACSM CEP, Exercise Physiologist    Supervising physician immediately available to respond to emergencies  Sheila Reynolds immediately available    Physician(s)  Dr. Tyrell Antonio    Medication changes reported      No    Fall or balance concerns reported     No    Tobacco Cessation  No Change    Warm-up and Cool-down  Performed as group-led instruction    Resistance Training Performed  Yes    VAD Patient?  No      Pain Assessment   Currently in Pain?  No/denies    Multiple Pain Sites  No       Capillary Blood Glucose: Results for orders placed or performed during the hospital encounter of 03/16/17 (from the past 24 hour(s))  Glucose, capillary     Status: None   Collection Time: 03/16/17 11:42 AM  Result Value Ref Range   Glucose-Capillary 84 65 - 99 mg/dL  Glucose, capillary     Status: Abnormal   Collection Time: 03/16/17 12:08 PM  Result Value Ref Range   Glucose-Capillary 121 (H) 65 - 99 mg/dL      Social History   Tobacco Use  Smoking Status Former Smoker  . Packs/day: 1.00  . Years: 4.00  . Pack years: 4.00  . Types: Cigarettes  . Last attempt to quit: 02/11/1980  . Years since quitting: 37.1  Smokeless Tobacco Never Used    Goals Met:  Exercise tolerated well  Goals Unmet:  Not Applicable  Comments: CBG 84 after first exercise station. Patient  asymptomatic.Sheila Reynolds was given lemonade to drink and was counseled by the dietitian. Recheck CBG 121. Sheila Reynolds participated in cool down without complaints. 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

## 2017-03-18 ENCOUNTER — Encounter (HOSPITAL_COMMUNITY)
Admission: RE | Admit: 2017-03-18 | Discharge: 2017-03-18 | Disposition: A | Discharge status: Home / Self Care | Payer: Medicare Other | Source: Ambulatory Visit | Attending: Cardiovascular Disease | Admitting: Cardiovascular Disease

## 2017-03-18 DIAGNOSIS — J45909 Unspecified asthma, uncomplicated: Secondary | ICD-10-CM | POA: Diagnosis not present

## 2017-03-18 DIAGNOSIS — Z48812 Encounter for surgical aftercare following surgery on the circulatory system: Secondary | ICD-10-CM | POA: Diagnosis not present

## 2017-03-18 DIAGNOSIS — Z9861 Coronary angioplasty status: Secondary | ICD-10-CM | POA: Diagnosis not present

## 2017-03-18 DIAGNOSIS — F419 Anxiety disorder, unspecified: Secondary | ICD-10-CM | POA: Diagnosis not present

## 2017-03-18 DIAGNOSIS — Z955 Presence of coronary angioplasty implant and graft: Secondary | ICD-10-CM

## 2017-03-18 DIAGNOSIS — M1990 Unspecified osteoarthritis, unspecified site: Secondary | ICD-10-CM | POA: Diagnosis not present

## 2017-03-18 DIAGNOSIS — D649 Anemia, unspecified: Secondary | ICD-10-CM | POA: Diagnosis not present

## 2017-03-18 LAB — GLUCOSE, CAPILLARY
GLUCOSE-CAPILLARY: 195 mg/dL — AB (ref 65–99)
Glucose-Capillary: 125 mg/dL — ABNORMAL HIGH (ref 65–99)

## 2017-03-18 NOTE — Progress Notes (Signed)
I have reviewed a Home Exercise Prescription with Sheila Reynolds . Sheila Reynolds is not currently exercising at home.  The patient was advised to walk 2-3 days a week for 30 minutes.  Sheila Reynolds and I discussed how to progress their exercise prescription. 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 The patient stated that they understand the exercise prescription.  We reviewed exercise guidelines, target heart rate during exercise, weather, and endpoints for exercise.  Patient is encouraged to come to me with any questions. I will continue to follow up with the patient to assist them with progression and safety.    Sheila Lair MS, ACSM CEP 4:28 PM 03/18/2017

## 2017-03-20 ENCOUNTER — Encounter (HOSPITAL_COMMUNITY)
Admission: RE | Admit: 2017-03-20 | Discharge: 2017-03-20 | Disposition: A | Discharge status: Home / Self Care | Payer: Medicare Other | Source: Ambulatory Visit | Attending: Cardiovascular Disease | Admitting: Cardiovascular Disease

## 2017-03-20 DIAGNOSIS — M1990 Unspecified osteoarthritis, unspecified site: Secondary | ICD-10-CM | POA: Diagnosis not present

## 2017-03-20 DIAGNOSIS — Z9861 Coronary angioplasty status: Secondary | ICD-10-CM | POA: Diagnosis not present

## 2017-03-20 DIAGNOSIS — D649 Anemia, unspecified: Secondary | ICD-10-CM | POA: Diagnosis not present

## 2017-03-20 DIAGNOSIS — Z955 Presence of coronary angioplasty implant and graft: Secondary | ICD-10-CM

## 2017-03-20 DIAGNOSIS — J45909 Unspecified asthma, uncomplicated: Secondary | ICD-10-CM | POA: Diagnosis not present

## 2017-03-20 DIAGNOSIS — F419 Anxiety disorder, unspecified: Secondary | ICD-10-CM | POA: Diagnosis not present

## 2017-03-20 DIAGNOSIS — Z48812 Encounter for surgical aftercare following surgery on the circulatory system: Secondary | ICD-10-CM | POA: Diagnosis not present

## 2017-03-20 LAB — GLUCOSE, CAPILLARY
Glucose-Capillary: 120 mg/dL — ABNORMAL HIGH (ref 65–99)
Glucose-Capillary: 164 mg/dL — ABNORMAL HIGH (ref 65–99)

## 2017-03-21 ENCOUNTER — Ambulatory Visit
Admission: RE | Admit: 2017-03-21 | Discharge: 2017-03-21 | Disposition: A | Discharge status: Home / Self Care | Payer: Medicare Other | Source: Ambulatory Visit | Attending: Orthopedic Surgery | Admitting: Orthopedic Surgery

## 2017-03-21 DIAGNOSIS — M542 Cervicalgia: Secondary | ICD-10-CM

## 2017-03-21 DIAGNOSIS — M4802 Spinal stenosis, cervical region: Secondary | ICD-10-CM | POA: Diagnosis not present

## 2017-03-23 ENCOUNTER — Encounter (HOSPITAL_BASED_OUTPATIENT_CLINIC_OR_DEPARTMENT_OTHER): Payer: Self-pay | Admitting: *Deleted

## 2017-03-23 ENCOUNTER — Other Ambulatory Visit: Payer: Self-pay

## 2017-03-23 ENCOUNTER — Other Ambulatory Visit: Payer: Self-pay | Admitting: Orthopedic Surgery

## 2017-03-23 ENCOUNTER — Emergency Department (HOSPITAL_BASED_OUTPATIENT_CLINIC_OR_DEPARTMENT_OTHER): Payer: Medicare Other

## 2017-03-23 ENCOUNTER — Emergency Department (HOSPITAL_BASED_OUTPATIENT_CLINIC_OR_DEPARTMENT_OTHER)
Admission: EM | Admit: 2017-03-23 | Discharge: 2017-03-24 | Disposition: A | Discharge status: Home / Self Care | Payer: Medicare Other | Attending: Emergency Medicine | Admitting: Emergency Medicine

## 2017-03-23 ENCOUNTER — Encounter (HOSPITAL_COMMUNITY): Payer: Medicare Other

## 2017-03-23 ENCOUNTER — Telehealth (HOSPITAL_COMMUNITY): Payer: Self-pay | Admitting: *Deleted

## 2017-03-23 DIAGNOSIS — R0602 Shortness of breath: Secondary | ICD-10-CM | POA: Diagnosis not present

## 2017-03-23 DIAGNOSIS — I10 Essential (primary) hypertension: Secondary | ICD-10-CM | POA: Insufficient documentation

## 2017-03-23 DIAGNOSIS — J45909 Unspecified asthma, uncomplicated: Secondary | ICD-10-CM | POA: Insufficient documentation

## 2017-03-23 DIAGNOSIS — Z79899 Other long term (current) drug therapy: Secondary | ICD-10-CM | POA: Diagnosis not present

## 2017-03-23 DIAGNOSIS — M25511 Pain in right shoulder: Secondary | ICD-10-CM

## 2017-03-23 DIAGNOSIS — E119 Type 2 diabetes mellitus without complications: Secondary | ICD-10-CM | POA: Insufficient documentation

## 2017-03-23 DIAGNOSIS — Z7982 Long term (current) use of aspirin: Secondary | ICD-10-CM | POA: Insufficient documentation

## 2017-03-23 DIAGNOSIS — R002 Palpitations: Secondary | ICD-10-CM

## 2017-03-23 DIAGNOSIS — Z7901 Long term (current) use of anticoagulants: Secondary | ICD-10-CM | POA: Insufficient documentation

## 2017-03-23 DIAGNOSIS — I2510 Atherosclerotic heart disease of native coronary artery without angina pectoris: Secondary | ICD-10-CM | POA: Diagnosis not present

## 2017-03-23 DIAGNOSIS — Z87891 Personal history of nicotine dependence: Secondary | ICD-10-CM | POA: Diagnosis not present

## 2017-03-23 LAB — BASIC METABOLIC PANEL
Anion gap: 8 (ref 5–15)
BUN: 17 mg/dL (ref 6–20)
CHLORIDE: 102 mmol/L (ref 101–111)
CO2: 26 mmol/L (ref 22–32)
Calcium: 9 mg/dL (ref 8.9–10.3)
Creatinine, Ser: 0.84 mg/dL (ref 0.44–1.00)
GFR calc non Af Amer: >60 mL/min (ref >60)
Glucose, Bld: 124 mg/dL — ABNORMAL HIGH (ref 65–99)
POTASSIUM: 3.8 mmol/L (ref 3.5–5.1)
SODIUM: 136 mmol/L (ref 135–145)

## 2017-03-23 LAB — CBC
HEMATOCRIT: 35 % — AB (ref 36.0–46.0)
Hemoglobin: 11.8 g/dL — ABNORMAL LOW (ref 12.0–15.0)
MCH: 28.4 pg (ref 26.0–34.0)
MCHC: 33.7 g/dL (ref 30.0–36.0)
MCV: 84.1 fL (ref 78.0–100.0)
Platelets: 196 10*3/uL (ref 150–400)
RBC: 4.16 MIL/uL (ref 3.87–5.11)
RDW: 13.8 % (ref 11.5–15.5)
WBC: 7.2 10*3/uL (ref 4.0–10.5)

## 2017-03-23 LAB — TROPONIN I: Troponin I: <0.03 ng/mL (ref <0.03)

## 2017-03-23 NOTE — ED Triage Notes (Signed)
Pt reports HTN and palpitations x 1 hour PTA. Compliant with medication. Denies CP. Reports SOB-speaking in complete sentences. Denies N/V. Pt ambulatory. No acute distress noted.

## 2017-03-23 NOTE — ED Provider Notes (Signed)
[facility] EMERGENCY DEPARTMENT Provider Note   CSN: [id number] Arrival date & time: 03/23/17  2111     History   Chief Complaint Chief Complaint  Patient presents with  . Palpitations    HPI Sheila Reynolds is a 72 y.o. female.   The history is provided by the patient, the spouse and medical records. No language interpreter was used.  Palpitations   This is a recurrent problem. The current episode started less than 1 hour ago. The problem occurs rarely. The problem has been resolved. On average, each episode lasts 1 second. Associated symptoms include shortness of breath (chronic and unchabnged). Pertinent negatives include no diaphoresis, no fever, no malaise/fatigue, no numbness, no chest pain, no chest pressure, no exertional chest pressure, no irregular heartbeat, no near-syncope, no syncope, no abdominal pain, no nausea, no vomiting, no headaches, no back pain, no leg pain, no lower extremity edema, no dizziness, no weakness, no cough, no hemoptysis and no sputum production. She has tried nothing for the symptoms. The treatment provided no relief. Risk factors include diabetes mellitus. Her past medical history is significant for heart disease.    Past Medical History:  Diagnosis Date  . Anemia    3 months ago anemic  . Anxiety    on meds  . Arthritis    "all over" (01/15/2017)  . Asthma   . Bronchitis with emphysema   . Chest pain   . Chronic bronchitis ([place])   . Coronary artery disease    Mild to moderate CAD  . Dysrhythmia    has fluttering at times  . Family history of anesthesia complication    daughter N/V  . Fibromyalgia   . GERD (gastroesophageal reflux disease)    on meds  . Headache(784.0)   . Heart murmur   . History of hiatal hernia   . Hx of echocardiogram    Echo (03/2013):  Tech limited; Mild focal basal septal hypertrophy, EF 60-65%, normal RVF  . Hyperlipidemia   . Hypertension   . Nonsustained ventricular tachycardia ([place])   .  OSA on CPAP   . Palpitations   . PVC (premature ventricular contraction)    a. Holter 12/16: NSR, occ PAC,PVCs  . Shortness of breath    loses breathe at times and have to take extra breathes  . Type II diabetes mellitus [facility])     Patient Active Problem List   Diagnosis Date Noted  . Diabetes mellitus without complication ([place]) 03/47/4259  . Angina pectoris ([place]) 01/15/2017  . CAD (coronary artery disease) 01/12/2017  . Shortness of breath 12/12/2016  . Acute maxillary sinusitis 05/03/2014  . Obstructive sleep apnea 11/04/2013  . Multiple lung nodules 07/18/2012  . Heart palpitations 03/11/2012  . HTN (hypertension) 03/11/2012  . GERD (gastroesophageal reflux disease) 07/13/2011  . Sarcoidosis 02/08/2011  . Chronic bronchitis ([place]) 02/05/2011  . Hyperlipemia 09/24/2010  . Atypical chest pain 09/24/2010  . NSVT (nonsustained ventricular tachycardia) ([place]) 09/24/2010  . Leg edema 09/24/2010    Past Surgical History:  Procedure Laterality Date  . BREAST SURGERY    . CARDIAC CATHETERIZATION  05/04/2007   reveals overall normal left ventricular systolic function. Ejection fraction 65-70%  . CATARACT EXTRACTION W/PHACO Right 07/20/2013   Procedure: CATARACT EXTRACTION PHACO AND INTRAOCULAR LENS PLACEMENT (IOC);  Surgeon: Marylynn Pearson, MD;  Location: [facility];  Service: Ophthalmology;  Laterality: Right;  . COLONOSCOPY W/ BIOPSIES AND POLYPECTOMY    . COLONOSCOPY WITH PROPOFOL N/A 09/07/2014   Procedure: COLONOSCOPY  WITH PROPOFOL;  Surgeon: Juanita Craver, MD;  Location: [facility];  Service: Endoscopy;  Laterality: N/A;  . CORONARY ANGIOPLASTY WITH STENT PLACEMENT  01/15/2017  . CORONARY ATHERECTOMY N/A 01/15/2017   Procedure: CORONARY ATHERECTOMY;  Surgeon: Nelva Bush, MD;  Location: [facility] CV LAB;  Service: Cardiovascular;  Laterality: N/A;  . CORONARY BALLOON ANGIOPLASTY N/A 01/15/2017   Procedure: CORONARY BALLOON ANGIOPLASTY;  Surgeon: Nelva Bush, MD;  Location: [facility];  Service: Cardiovascular;  Laterality: N/A;  . CORONARY STENT INTERVENTION N/A 01/15/2017   Procedure: CORONARY STENT INTERVENTION;  Surgeon: Nelva Bush, MD;  Location: [facility];  Service: Cardiovascular;  Laterality: N/A;  . ESOPHAGOGASTRODUODENOSCOPY (EGD) WITH PROPOFOL N/A 09/07/2014   Procedure: ESOPHAGOGASTRODUODENOSCOPY (EGD) WITH PROPOFOL;  Surgeon: Juanita Craver, MD;  Location: [facility];  Service: Endoscopy;  Laterality: N/A;  . EXTERNAL EAR SURGERY Bilateral 1970s   tumors removed  . EYE SURGERY    . INTRAVASCULAR PRESSURE WIRE/FFR STUDY N/A 12/12/2016   Procedure: INTRAVASCULAR PRESSURE WIRE/FFR STUDY;  Surgeon: Nelva Bush, MD;  Location: [place] [facility];  Service: Cardiovascular;  Laterality: N/A;  . MINI SHUNT INSERTION Right 07/20/2013   Procedure: INSERTION OF GLAUCOMA FILTRATION DEVICE RIGHT EYE;  Surgeon: Marylynn Pearson, MD;  Location: [facility];  Service: Ophthalmology;  Laterality: Right;  . MITOMYCIN C APPLICATION Right 06/18/3265   Procedure: MITOMYCIN C APPLICATION;  Surgeon: Marylynn Pearson, MD;  Location: [facility];  Service: Ophthalmology;  Laterality: Right;  . MITOMYCIN C APPLICATION Right 03/06/5807   Procedure: MITOMYCIN C APPLICATION RIGHT EYE;  Surgeon: Marylynn Pearson, MD;  Location: [facility];  Service: Ophthalmology;  Laterality: Right;  . PLACEMENT OF BREAST IMPLANTS Bilateral 1992   "took all my breast tissue out; put implants in;fibrocystic breast disease "  . RIGHT/LEFT HEART CATH AND CORONARY ANGIOGRAPHY N/A 12/12/2016   Procedure: RIGHT/LEFT HEART CATH AND CORONARY ANGIOGRAPHY;  Surgeon: Nelva Bush, MD;  Location: [facility] CV LAB;  Service: Cardiovascular;  Laterality: N/A;  . TONSILLECTOMY    . TOTAL ABDOMINAL HYSTERECTOMY  1982  . TRABECULECTOMY Right 02/21/2015   Procedure: TRABECULECTOMY WITH [facility] ON THE RIGHT EYE;  Surgeon: Marylynn Pearson, MD;  Location: [facility];  Service: Ophthalmology;  Laterality: Right;    OB History     Gravida Para Term Preterm AB Living   2 2       2    SAB TAB Ectopic Multiple Live Births                   Home Medications    Prior to Admission medications   Medication Sig Start Date End Date Taking? Authorizing Provider  acetaminophen (TYLENOL) 500 MG tablet Take 1,000 mg by mouth 2 (two) times daily as needed for moderate pain or headache.    [provider]  albuterol (PROVENTIL) (2.5 MG/3ML) 0.083% nebulizer solution Take 2.5 mg by nebulization daily as needed for wheezing or shortness of breath.     [provider]  aspirin EC 81 MG tablet Take 81 mg by mouth at bedtime.     [provider]  atorvastatin (LIPITOR) 40 MG tablet Take 1 tablet (40 mg total) by mouth daily. 01/16/17   Bhagat, Crista Luria, PA  benzonatate (TESSALON) 200 MG capsule Take 1 capsule (200 mg total) by mouth 3 (three) times daily as needed for cough. 08/18/16   Baird Lyons D, MD  brimonidine (ALPHAGAN P) 0.1 % SOLN Place 1 drop into the left eye 3 (three) times  daily.     [provider]  brinzolamide (AZOPT) 1 % ophthalmic suspension Place 1 drop into the left eye 3 (three) times daily.     [provider]  clopidogrel (PLAVIX) 75 MG tablet Take 1 tablet (75 mg total) by mouth daily. 12/12/16 12/12/17  End, Harrell Gave, MD  furosemide (LASIX) 20 MG tablet Take 1 tablet (20 mg total) by mouth every 3 (three) days. 07/20/15   [name], Wonda Cheng, MD  guaiFENesin (ROBITUSSIN) 100 MG/5ML SOLN Take 5 mLs by mouth every 6 (six) hours as needed for cough or to loosen phlegm.     [provider]  isosorbide mononitrate (IMDUR) 30 MG 24 hr tablet Take 1 tablet (30 mg total) by mouth daily. 12/12/16 12/12/17  End, Harrell Gave, MD  ketorolac (ACULAR) 0.5 % ophthalmic solution Place 1 drop into the left eye 4 (four) times daily.    [provider]  loteprednol (LOTEMAX) 0.5 % ophthalmic suspension Place 3-4 drops into both eyes See admin instructions. Instill 4 drops  into the left eye once daily, and 3 drops in the right eye once daily    [provider]  Menthol, Topical Analgesic, (BIOFREEZE EX) Apply 1 application topically daily as needed (foot pain).    [provider]  metoprolol tartrate (LOPRESSOR) 50 MG tablet Patient takes 25 mg in the morning and 50 mg in the evening 02/25/17   [name], Wonda Cheng, MD  montelukast (SINGULAIR) 10 MG tablet Take 10 mg by mouth at bedtime.    [provider]  Multiple Vitamins-Minerals (MULTIVITAMIN ADULTS PO) Take by mouth.    [provider]  nitroGLYCERIN (NITROSTAT) 0.4 MG SL tablet Place 1 tablet (0.4 mg total) under the tongue every 5 (five) minutes as needed for chest pain. 12/12/16 12/12/17  End, Harrell Gave, MD  olmesartan (BENICAR) 20 MG tablet Take 20 mg by mouth daily.    [provider]  pantoprazole (PROTONIX) 40 MG tablet Take 1 tablet (40 mg total) by mouth daily. 01/16/17   Bhagat, Crista Luria, PA  pregabalin (LYRICA) 75 MG capsule Take 75 mg by mouth at bedtime.    [provider]  PROAIR HFA 108 [phone] Base) MCG/ACT inhaler Inhale 2 puffs into the lungs every 6 (six) hours as needed for wheezing. 08/09/15   Deneise Lever, MD  sitaGLIPtin-metformin (JANUMET) 50-500 MG tablet Take 1 tablet by mouth as directed. Take 1 tablet twice daily on Monday through Friday, take 1 tablet daily on Saturdays and Sundays 12/15/16   End, Harrell Gave, MD  timolol (BETIMOL) 0.5 % ophthalmic solution Place 1 drop into the left eye 2 (two) times daily.    [provider]  tolnaftate (TINACTIN) 1 % cream Apply 1 application topically daily as needed (foot itching).    [provider]  traMADol (ULTRAM) 50 MG tablet Take 1 tablet (50 mg total) by mouth every 6 (six) hours as needed. 02/18/17   Young, Tarri Fuller D, MD  travoprost, benzalkonium, (TRAVATAN) 0.004 % ophthalmic solution Place 1 drop into both eyes at bedtime.    [provider]  Turmeric 500 MG TABS  Take 500 mg by mouth daily.    [provider]    Family History Family History  Problem Relation Age of Onset  . Heart disease Father   . Diabetes Father   . Glaucoma Father   . Cancer Mother        unknown type, ?lung  . Heart disease Paternal Grandmother   . Cancer Paternal  Grandmother        unknown type  . Diabetes Brother   . Glaucoma Brother     Social History Social History   Tobacco Use  . Smoking status: Former Smoker    Packs/day: 1.00    Years: 4.00    Pack years: 4.00    Types: Cigarettes    Last attempt to quit: 02/11/1980    Years since quitting: 37.1  . Smokeless tobacco: Never Used  Substance Use Topics  . Alcohol use: No  . Drug use: No     Allergies   Crestor [rosuvastatin calcium]; Shellfish allergy; and Demerol [meperidine]   Review of Systems Review of Systems  Constitutional: Negative for activity change, chills, diaphoresis, fatigue, fever and malaise/fatigue.  HENT: Negative for congestion and rhinorrhea.   Respiratory: Positive for shortness of breath (chronic and unchabnged). Negative for cough, hemoptysis, sputum production, chest tightness, wheezing and stridor.   Cardiovascular: Positive for palpitations. Negative for chest pain, leg swelling, syncope and near-syncope.  Gastrointestinal: Negative for abdominal pain, constipation, diarrhea, nausea and vomiting.  Genitourinary: Negative for dysuria, flank pain and genital sores.  Musculoskeletal: Negative for back pain, neck pain and neck stiffness.  Skin: Negative for rash and wound.  Neurological: Negative for dizziness, weakness, light-headedness, numbness and headaches.  Psychiatric/Behavioral: Negative for agitation.  All other systems reviewed and are negative.    Physical Exam Updated Vital Signs Ht 5\' 2"  (1.575 m)   Wt 75.3 kg (166 lb)   BMI 30.36 kg/m   Physical Exam  Constitutional: She is oriented to person, place, and time. She appears well-developed and  well-nourished. No distress.  HENT:  Head: Normocephalic.  Nose: Nose normal.  Mouth/Throat: Oropharynx is clear and moist. No oropharyngeal exudate.  Eyes: Conjunctivae and EOM are normal. Pupils are equal, round, and reactive to light.  Neck: Normal range of motion. No JVD present.  Cardiovascular: Normal rate and intact distal pulses.  No murmur heard. Pulmonary/Chest: Effort normal and breath sounds normal. No respiratory distress. She has no wheezes.  Abdominal: Soft. Bowel sounds are normal. She exhibits no distension. There is no tenderness.  Musculoskeletal: She exhibits edema (mild bilateral). She exhibits no tenderness.  Neurological: She is alert and oriented to person, place, and time. No cranial nerve deficit or sensory deficit. She exhibits normal muscle tone. Coordination normal.  Skin: Capillary refill takes less than 2 seconds. No rash noted. She is not diaphoretic. No erythema.  Psychiatric: She has a normal mood and affect.  Nursing note and vitals reviewed.    ED Treatments / Results  Labs (all labs ordered are listed, but only abnormal results are displayed) Labs Reviewed  BASIC METABOLIC PANEL - Abnormal; Notable for the following components:      Result Value   Glucose, Bld 124 (*)    All other components within normal limits  CBC - Abnormal; Notable for the following components:   Hemoglobin 11.8 (*)    HCT 35.0 (*)    All other components within normal limits  TROPONIN I    EKG  EKG Interpretation  Date/Time:  Monday March 23 2017 21:20:41 EST Ventricular Rate:  65 PR Interval:  150 QRS Duration: 70 QT Interval:  410 QTC Calculation: 426 R Axis:   41 Text Interpretation:  Normal sinus rhythm Normal ECG When compared to prior, no significant changes seen.  No STEMI Confirmed by Antony Blackbird [phone]) on 03/23/2017 10:02:18 PM       Radiology Dg Chest 2 View  Result Date: 03/23/2017 CLINICAL DATA:  Shortness of breath on exertion. EXAM:  CHEST  2 VIEW COMPARISON:  Radiographs of August 18, 2016. FINDINGS: The heart size and mediastinal contours are within normal limits. Both lungs are clear. No pneumothorax or pleural effusion is noted. The visualized skeletal structures are unremarkable. IMPRESSION: No active cardiopulmonary disease. Electronically Signed   By: Marijo Conception, M.D.   On: 03/23/2017 21:52    Procedures Procedures (including critical care time)  Medications Ordered in ED Medications - No data to display   Initial Impression / Assessment and Plan / ED Course  I have reviewed the triage vital signs and the nursing notes.  Pertinent labs & imaging results that were available during my care of the patient were reviewed by me and considered in my medical decision making (see chart for details).     LATALIA ETZLER is a 72 y.o. female with a past medical history significant for CAD status post PCI, prior palpitations, sarcoidosis, hypertension, GERD, and diabetes who presents with a palpitation sensation.  Patient reports that approximate 1 hour prior to arrival she had a sensation of palpitations.  It lasted for seconds and then went away.  She reports having one episode after arrival to the ED.  She denies any chest pain and reports chronic shortness of breath.  She reports her shortness of breath is unchanged from her baseline.  She denies recent fevers, chills, nausea, vomiting, cough, congestion, or rhinorrhea,.  She reports no significant urinary symptoms from baseline.  She reports that she has right shoulder pain which has been ongoing for the last month and she is currently in the midst of getting worked up with MRIs.  She denies new symptoms in this regard.  On exam, lungs are clear.  Chest is nontender.  Abdomen not neck nontender.  Legs are chronically edematous with no significant unilateral symptoms.  No tenderness.  Patient had tenderness in the right shoulder however no numbness, tingling, or weakness in  hands.  No focal neurologic deficits seen.  EKG showed no STEMI.  No significant arrhythmias.  Telemetry was reviewed and patient was found to have a PVC at the time of her palpitation sensation.  No evidence of V. tach or other sustained arrhythmias.  Due to patient's history of cardiac ab normalities, troponin and chest x-ray were ordered.  Laboratory testing also ordered to look for significant electrolyte ab normalities.  Workup overall reassuring.  No evidence of acute infections.  Patient was ambulated without difficulty and had no symptoms.  No further palpitations during several hours of observation in the ED.  Next  As patient symptoms are recurrent and she has not had any further symptoms here, do not feel patient needs further workup or management at this time.  Patient will follow up with her PCP and her cardiologist for reassessment.  Patient will stay hydrated.  Do not feel patient has any need for further imaging or transfer at this time.  Next  Patient and family understood plan of care as well as return precautions.  Patient will follow-up as directed.  Patient had no other questions or concerns and patient was discharged in good condition.   Final Clinical Impressions(s) / ED Diagnoses   Final diagnoses:  Palpitations    ED Discharge Orders    None     Clinical Impression: 1. Palpitations     Disposition: Discharge  Condition: Good  I have discussed the results, Dx and Tx plan with the  pt(& family if present). He/she/they expressed understanding and agree(s) with the plan. Discharge instructions discussed at great length. Strict return precautions discussed and pt &/or family have verbalized understanding of the instructions. No further questions at time of discharge.    New Prescriptions   No medications on file    Follow Up: [place], [place] [address] [phone]     [employer] [address] [id number] XH FSFS [place] [phone]       Tegeler, Gwenyth Allegra, MD 03/24/17 [phone]

## 2017-03-24 ENCOUNTER — Ambulatory Visit: Payer: Medicare Other | Admitting: Cardiovascular Disease

## 2017-03-24 NOTE — Discharge Instructions (Signed)
Your workup today showed no significant evidence of electrolyte abnormalities, infection, or a heart injury causing her symptoms.  We saw evidence of a PVC, extra beat, that likely cause your palpitation sensation.  Given the reassuring workup and your well appearance in the ED, we feel you are safe for discharge home for outpatient follow-up with your PCP and/or cardiologist.  Please stay hydrated.  If any symptoms change or worsen, please return to the nearest emergency department.

## 2017-03-24 NOTE — Progress Notes (Signed)
Patient ambulated around the department while on pulse oximetry.  Patient's SPO2 remained between 94% and 96% and her heart rate remained between 86 and 90.  When the patient finished walking she then went to the bath room on her own.

## 2017-03-25 ENCOUNTER — Ambulatory Visit: Payer: Medicare Other | Admitting: Registered"

## 2017-03-25 ENCOUNTER — Encounter (HOSPITAL_COMMUNITY): Payer: Medicare Other

## 2017-03-26 ENCOUNTER — Ambulatory Visit
Admission: RE | Admit: 2017-03-26 | Discharge: 2017-03-26 | Disposition: A | Discharge status: Home / Self Care | Payer: Medicare Other | Source: Ambulatory Visit | Attending: Orthopedic Surgery | Admitting: Orthopedic Surgery

## 2017-03-26 DIAGNOSIS — M25511 Pain in right shoulder: Secondary | ICD-10-CM

## 2017-03-27 ENCOUNTER — Encounter (HOSPITAL_COMMUNITY): Payer: Medicare Other

## 2017-03-30 ENCOUNTER — Telehealth: Payer: Self-pay

## 2017-03-30 ENCOUNTER — Encounter (HOSPITAL_COMMUNITY)
Admission: RE | Admit: 2017-03-30 | Discharge: 2017-03-30 | Disposition: A | Discharge status: Home / Self Care | Payer: Medicare Other | Source: Ambulatory Visit | Attending: Cardiovascular Disease | Admitting: Cardiovascular Disease

## 2017-03-30 ENCOUNTER — Encounter: Payer: Self-pay | Admitting: Physician Assistant

## 2017-03-30 DIAGNOSIS — I493 Ventricular premature depolarization: Secondary | ICD-10-CM | POA: Insufficient documentation

## 2017-03-30 DIAGNOSIS — I491 Atrial premature depolarization: Secondary | ICD-10-CM | POA: Insufficient documentation

## 2017-03-30 DIAGNOSIS — M25511 Pain in right shoulder: Secondary | ICD-10-CM | POA: Diagnosis not present

## 2017-03-30 NOTE — Telephone Encounter (Signed)
   [facility] Pre-operative Risk Assessment    Request for surgical clearance:  1. What type of surgery is being performed? Right Shoulder Scope Rotator Cuff Repair   2. When is this surgery scheduled? Pending   3. What type of clearance is required (medical clearance vs. Pharmacy clearance to hold med vs. Both)? Pharmacy  4. Are there any medications that need to be held prior to surgery and how long? Plavix and Aspirin   5. Practice name and name of physician performing surgery? [facility]/Dr. Flossie Dibble   6. What is your office phone and fax number? Phone [phone] Fax [phone]  7. Anesthesia type (None, local, MAC, general) ? Not stated   Mady Haagensen 03/30/2017, 4:32 PM  _________________________________________________________________   (provider comments below)

## 2017-03-30 NOTE — Progress Notes (Addendum)
D   Cardiology Office Note    Date:  03/31/2017  ID:  Sheila Reynolds, DOB 1945/10/20, MRN [id number] PCP:  Glendale Chard, MD  Cardiologist:  Dr. Acie Fredrickson   Chief Complaint: palpitations  History of Present Illness:  Sheila Reynolds is a 72 y.o. female with history of long history of palpitations with NSVT, PACs, PVCs, sarcoidosis, HTN, HLD, anemia, anxiety, arthritis, OSA on CPAP, fibromyalgia, and recently diagnosed CAD who presents for evaluation of palpitations and also pre-operative clearance.   She has long history of palpitations. 48 hour holter 01/2015 showed NSR occasional PACs/PVCs. Event monitor 04/2016 showed NSR without arrhythmias. Details regarding prior NSVT are not outlined in chart that I can see. More recently she underwent cath 12/2016 for DOE with 70% LAD. Medical therapy was initiated but she continued to have symptoms. In 01/2017 she underwent orbital atherectomy/DES to the proxmal LAD and PTCA to ostial D2. 2D Echo 01/15/17 showed mild LVH, EF 60-65%, grade 1 DD.  She has recently been dealing with a rotator cuff tear in her right shoulder. She received some news that she would need another MRI then began having more frequent skips in her heartbeat lasting 1 second at a time. She checked her BP and it was 536 systolic so became concerned and presented to the ER. Per their notes, BP was 156/82 upon arrival and telemetry showed 1 PVC during monitoring. Palpitations resolved spontaneously and are now at their baseline occasional level. Last labs showed Hgb 11.8 (stable), K 3.8, Cr 0.84 (03/2017), TSH normal (07/2016), LDL 67 (04/2016). She denies any chest pain or dyspnea. No syncope or edema. She describes daytime fatigue which she attributes to the metoprolol and is therefore only on 25mg  QAM/50mg  QPM. She does not use her CPAP all the time.   Past Medical History:  Diagnosis Date  . Anemia    3 months ago anemic  . Anxiety    on meds  . Arthritis    "all over"  (01/15/2017)  . Asthma   . Bronchitis with emphysema   . Chest pain   . Chronic bronchitis ([place])   . Coronary artery disease    a. 01/2017 she underwent orbital atherectomy/DES to the proxmal LAD and PTCA to ostial D2. 2D Echo 01/15/17 showed mild LVH, EF 60-65%, grade 1 DD.  . Family history of anesthesia complication    daughter N/V  . Fibromyalgia   . GERD (gastroesophageal reflux disease)    on meds  . Heart murmur   . History of hiatal hernia   . Hx of echocardiogram    Echo (03/2013):  Tech limited; Mild focal basal septal hypertrophy, EF 60-65%, normal RVF  . Hyperlipidemia   . Hypertension   . Nonsustained ventricular tachycardia ([place])   . OSA on CPAP   . Premature atrial contractions   . PVC (premature ventricular contraction)    a. Holter 12/16: NSR, occ PAC,PVCs  . Sarcoidosis   . Type II diabetes mellitus ([place])     Past Surgical History:  Procedure Laterality Date  . BREAST SURGERY    . CARDIAC CATHETERIZATION  05/04/2007   reveals overall normal left ventricular systolic function. Ejection fraction 65-70%  . CATARACT EXTRACTION W/PHACO Right 07/20/2013   Procedure: CATARACT EXTRACTION PHACO AND INTRAOCULAR LENS PLACEMENT (IOC);  Surgeon: Marylynn Pearson, MD;  Location: [facility];  Service: Ophthalmology;  Laterality: Right;  . COLONOSCOPY W/ BIOPSIES AND POLYPECTOMY    . COLONOSCOPY WITH PROPOFOL N/A 09/07/2014   Procedure: COLONOSCOPY  WITH PROPOFOL;  Surgeon: Juanita Craver, MD;  Location: [facility];  Service: Endoscopy;  Laterality: N/A;  . CORONARY ANGIOPLASTY WITH STENT PLACEMENT  01/15/2017  . CORONARY ATHERECTOMY N/A 01/15/2017   Procedure: CORONARY ATHERECTOMY;  Surgeon: Nelva Bush, MD;  Location: [facility];  Service: Cardiovascular;  Laterality: N/A;  . CORONARY BALLOON ANGIOPLASTY N/A 01/15/2017   Procedure: CORONARY BALLOON ANGIOPLASTY;  Surgeon: Nelva Bush, MD;  Location: [facility];  Service: Cardiovascular;  Laterality: N/A;  .  CORONARY STENT INTERVENTION N/A 01/15/2017   Procedure: CORONARY STENT INTERVENTION;  Surgeon: Nelva Bush, MD;  Location: [facility];  Service: Cardiovascular;  Laterality: N/A;  . ESOPHAGOGASTRODUODENOSCOPY (EGD) WITH PROPOFOL N/A 09/07/2014   Procedure: ESOPHAGOGASTRODUODENOSCOPY (EGD) WITH PROPOFOL;  Surgeon: Juanita Craver, MD;  Location: [facility];  Service: Endoscopy;  Laterality: N/A;  . EXTERNAL EAR SURGERY Bilateral 1970s   tumors removed  . EYE SURGERY    . INTRAVASCULAR PRESSURE WIRE/FFR STUDY N/A 12/12/2016   Procedure: INTRAVASCULAR PRESSURE WIRE/FFR STUDY;  Surgeon: Nelva Bush, MD;  Location: [facility];  Service: Cardiovascular;  Laterality: N/A;  . MINI SHUNT INSERTION Right 07/20/2013   Procedure: INSERTION OF GLAUCOMA FILTRATION DEVICE RIGHT EYE;  Surgeon: Marylynn Pearson, MD;  Location: [facility];  Service: Ophthalmology;  Laterality: Right;  . MITOMYCIN C APPLICATION Right 3/32/9518   Procedure: MITOMYCIN C APPLICATION;  Surgeon: Marylynn Pearson, MD;  Location: [facility];  Service: Ophthalmology;  Laterality: Right;  . MITOMYCIN C APPLICATION Right 8/41/6606   Procedure: MITOMYCIN C APPLICATION RIGHT EYE;  Surgeon: Marylynn Pearson, MD;  Location: [facility];  Service: Ophthalmology;  Laterality: Right;  . PLACEMENT OF BREAST IMPLANTS Bilateral 1992   "took all my breast tissue out; put implants in;fibrocystic breast disease "  . RIGHT/LEFT HEART CATH AND CORONARY ANGIOGRAPHY N/A 12/12/2016   Procedure: RIGHT/LEFT HEART CATH AND CORONARY ANGIOGRAPHY;  Surgeon: Nelva Bush, MD;  Location: [facility];  Service: Cardiovascular;  Laterality: N/A;  . TONSILLECTOMY    . TOTAL ABDOMINAL HYSTERECTOMY  1982  . TRABECULECTOMY Right 02/21/2015   Procedure: TRABECULECTOMY WITH [facility] ON THE RIGHT EYE;  Surgeon: Marylynn Pearson, MD;  Location: [facility] [facility];  Service: Ophthalmology;  Laterality: Right;    Current Medications: Current Meds  Medication Sig  . acetaminophen (TYLENOL)  500 MG tablet Take 1,000 mg by mouth 2 (two) times daily as needed for moderate pain or headache.  . albuterol (PROVENTIL) (2.5 MG/3ML) 0.083% nebulizer solution Take 2.5 mg by nebulization daily as needed for wheezing or shortness of breath.   [name] aspirin EC 81 MG tablet Take 81 mg by mouth at bedtime.   [name] atorvastatin (LIPITOR) 40 MG tablet Take 1 tablet (40 mg total) by mouth daily.  . benzonatate (TESSALON) 200 MG capsule Take 1 capsule (200 mg total) by mouth 3 (three) times daily as needed for cough.  . brimonidine (ALPHAGAN P) 0.1 % SOLN Place 1 drop into the left eye 3 (three) times daily.   . brinzolamide (AZOPT) 1 % ophthalmic suspension Place 1 drop into the left eye 3 (three) times daily.   . clopidogrel (PLAVIX) 75 MG tablet Take 1 tablet (75 mg total) by mouth daily.  . furosemide (LASIX) 20 MG tablet Take 1 tablet (20 mg total) by mouth every 3 (three) days.  [name] guaiFENesin (ROBITUSSIN) 100 MG/5ML SOLN Take 5 mLs by mouth every 6 (six) hours as needed for cough or to loosen phlegm.   . isosorbide mononitrate (  IMDUR) 30 MG 24 hr tablet Take 1 tablet (30 mg total) by mouth daily.  [name] ketorolac (ACULAR) 0.5 % ophthalmic solution Place 1 drop into the left eye 4 (four) times daily.  [name] loteprednol (LOTEMAX) 0.5 % ophthalmic suspension Place 3-4 drops into both eyes See admin instructions. Instill 4 drops into the left eye once daily, and 3 drops in the right eye once daily  . Menthol, Topical Analgesic, (BIOFREEZE EX) Apply 1 application topically daily as needed (foot pain).  . metoprolol tartrate (LOPRESSOR) 50 MG tablet Patient takes 25 mg in the morning and 50 mg in the evening  . montelukast (SINGULAIR) 10 MG tablet Take 10 mg by mouth at bedtime.  . nitroGLYCERIN (NITROSTAT) 0.4 MG SL tablet Place 1 tablet (0.4 mg total) under the tongue every 5 (five) minutes as needed for chest pain.  [name] olmesartan (BENICAR) 20 MG tablet Take 20 mg by mouth daily.  . pantoprazole (PROTONIX) 40 MG  tablet Take 1 tablet (40 mg total) by mouth daily.  . pregabalin (LYRICA) 75 MG capsule Take 75 mg by mouth at bedtime.  [name] PROAIR HFA 108 (90 Base) MCG/ACT inhaler Inhale 2 puffs into the lungs every 6 (six) hours as needed for wheezing.  . sitaGLIPtin-metformin (JANUMET) 50-500 MG tablet Take 1 tablet by mouth as directed. Take 1 tablet twice daily on Monday through Friday, take 1 tablet daily on Saturdays and Sundays  . timolol (BETIMOL) 0.5 % ophthalmic solution Place 1 drop into the left eye 2 (two) times daily.  [name] tolnaftate (TINACTIN) 1 % cream Apply 1 application topically daily as needed (foot itching).  . traMADol (ULTRAM) 50 MG tablet Take 1 tablet (50 mg total) by mouth every 6 (six) hours as needed.  . travoprost, benzalkonium, (TRAVATAN) 0.004 % ophthalmic solution Place 1 drop into both eyes at bedtime.  . [DISCONTINUED] Multiple Vitamins-Minerals (MULTIVITAMIN ADULTS PO) Take by mouth.  . [DISCONTINUED] Turmeric 500 MG TABS Take 500 mg by mouth daily.   Allergies:   Crestor [rosuvastatin calcium]; Shellfish allergy; and Demerol [meperidine]   Social History   Socioeconomic History  . Marital status: Married    Spouse name: None  . Number of children: 2  . Years of education: None  . Highest education level: None  Social Needs  . Financial resource strain: None  . Food insecurity - worry: None  . Food insecurity - inability: None  . Transportation needs - medical: None  . Transportation needs - non-medical: None  Occupational History  . Occupation: unemployed    Fish farm manager: [employer]  Tobacco Use  . Smoking status: Former Smoker    Packs/day: 1.00    Years: 4.00    Pack years: 4.00    Types: Cigarettes    Last attempt to quit: 02/11/1980    Years since quitting: 37.1  . Smokeless tobacco: Never Used  Substance and Sexual Activity  . Alcohol use: No  . Drug use: No  . Sexual activity: Yes  Other Topics Concern  . None  Social History Narrative    . None     Family History:  Family History  Problem Relation Age of Onset  . Heart disease Father   . Diabetes Father   . Glaucoma Father   . Cancer Mother        unknown type, ?lung  . Heart disease Paternal Grandmother   . Cancer Paternal Grandmother        unknown type  . Diabetes Brother   .  Glaucoma Brother     ROS:   Please see the history of present illness.  All other systems are reviewed and otherwise negative.    PHYSICAL EXAM:   VS:  BP 140/86   Pulse 67   Ht 5\' 2"  (1.575 m)   Wt 168 lb 12.8 oz (76.6 kg)   BMI 30.87 kg/m   BMI: Body mass index is 30.87 kg/m. GEN: Well nourished, well developed AAF, in no acute distress  HEENT: normocephalic, atraumatic Neck: no JVD, carotid bruits, or masses Cardiac: RRR; no murmurs, rubs, or gallops, no edema  Respiratory:  clear to auscultation bilaterally, normal work of breathing GI: soft, nontender, nondistended, + BS MS: no deformity or atrophy, patient guarding R arm during BP check. Skin: warm and dry, no rash Neuro:  Alert and Oriented x 3, Strength and sensation are intact, follows commands Psych: euthymic mood, full affect  Wt Readings from Last 3 Encounters:  03/31/17 168 lb 12.8 oz (76.6 kg)  03/23/17 166 lb (75.3 kg)  02/24/17 166 lb 14.2 oz (75.7 kg)      Studies/Labs Reviewed:   EKG:  EKG was ordered today and personally reviewed by me and demonstrates NSR 67bpm, no acute ST-T Changes, no ectopy  Recent Labs: 07/17/2016: TSH 0.796 03/23/2017: BUN 17; Creatinine, Ser 0.84; Hemoglobin 11.8; Platelets 196; Potassium 3.8; Sodium 136   Lipid Panel    Component Value Date/Time   CHOL 133 05/07/2016 1031   TRIG 80 05/07/2016 1031   HDL 50 05/07/2016 1031   CHOLHDL 2.7 05/07/2016 1031   CHOLHDL 3 05/24/2014 1650   VLDL 20.8 05/24/2014 1650   LDLCALC 67 05/07/2016 1031    Additional studies/ records that were reviewed today include: Summarized above.    ASSESSMENT & PLAN:   1. Palpitations  with history of PACs/PVCs/NSVT - prior monitoring has shown PACs and PVCs. I am not sure where prior diagnosis of NSVT comes from, but do not see this has been an issue in recent times. Telemetry monitoring in the ED was benign. Will recheck lytes including K and Mg, as well as TSH. Discussed Jodelle Red as an option for monitoring in the future. Ideal therapy would be beta blocker but she has had fatigue with metoprolol. Will d/c this and try a trial of carvedilol instead. She will keep [place] informed how she feels. I do think some of her fatigue is likely from untreated OSA which can also have a direct effect on rhythm disturbance. 2. Pre-operative evaluation - we received cardiac clearance for rotator cuff repair along with a request to stop aspirin and Plavix. The patient had orbital atherectomy and drug-eluting stent placement on 01/15/17 and it was recommended she continue this uninterrupted for at least 6 months, ideally longer. Therefore she is not able to have any surgeries that require holding of blood thinners until at least after 07/16/17. Even then, this would need to be cleared through her primary cardiologist. I will route this note as well as standard pre-op reply to requesting party. The patient also inquired about possible cataract surgery. She is not sure if there may be any additional eye work needed during that time. I told her to have the eye doctor send over a formal request to clarify - if it is solely cataract surgery, based on ACC/AHA guidelines, she would be allowed proceed as cataract surgery is recognized as a low risk procedure that does not require holding of blood thinners - if this were requested, the eye team should  be again reminded she is NOT allowed to stop at this time as above. 3. CAD - continue ASA and Plavix. Change BB as above. Continue statin. She is due for LFTs as well since atorvastatin was titrated in December; will add to labs today. 4. OSA - compliance encouraged. Discussed  risks of untreated OSA. 5. HTN - follow with titration of carvedilol. Advised compliance with CPAP, regular BP monitoring at home, and asked her to call us if she tends to get readings of greater than 694 systolic or 80 diastolic.  Disposition: F/u with Dr. Acie Fredrickson in 3 months.   Medication Adjustments/Labs and Tests Ordered: Current medicines are reviewed at length with the patient today.  Concerns regarding medicines are outlined above. Medication changes, Labs and Tests ordered today are summarized above and listed in the Patient Instructions accessible in Encounters.   Signed, Charlie Pitter, PA-C  03/31/2017 11:33 AM    [facility] [address], [place], [place]  [address] Phone: [phone]; Fax: [phone]

## 2017-03-30 NOTE — Progress Notes (Signed)
Incomplete Session Note  Patient Details  Name: Sheila Reynolds MRN: [id number] Date of Birth: 08/03/1945 Referring Provider:     CARDIAC REHAB PHASE II ORIENTATION from 02/24/2017 in [facility]  Referring Provider  [name], Arnette Norris MD      Maricela Bo Leise did not complete her rehab session.  [name] went to the ED on 03/23/17 with palpitations and was told to follow up with her cardiologist. Dr Elmarie Shiley office called and notified. Appointment made for Tavia to follow up with Sharrell Ku PA tomorrow at 1030. Amnah did not exercise today. Will await clearance from Sharrell Ku PA when the patient can return to exercise. Brett is going to see her orthopedist today as she continues to have problems with her right shoulder. Patient given a copy of her exercise flow sheet to take to Dr Elmarie Shiley office.Barnet Pall, RN,BSN 03/30/2017 12:18 PM

## 2017-03-30 NOTE — Telephone Encounter (Signed)
   Chart reviewed as part of pre-operative protocol coverage  I actually see this patient tomorrow for evaluation of palpitations and will take a look then. Will leave this in pre-op box in case she does not show.  Charlie Pitter, PA-C 03/30/2017, 5:09 PM

## 2017-03-31 ENCOUNTER — Encounter: Payer: Self-pay | Admitting: Physician Assistant

## 2017-03-31 ENCOUNTER — Ambulatory Visit (INDEPENDENT_AMBULATORY_CARE_PROVIDER_SITE_OTHER): Payer: Medicare Other | Admitting: Physician Assistant

## 2017-03-31 VITALS — BP 140/86 | HR 67 | Ht 62.0 in | Wt 168.8 lb

## 2017-03-31 DIAGNOSIS — G4733 Obstructive sleep apnea (adult) (pediatric): Secondary | ICD-10-CM

## 2017-03-31 DIAGNOSIS — I493 Ventricular premature depolarization: Secondary | ICD-10-CM | POA: Diagnosis not present

## 2017-03-31 DIAGNOSIS — Z01818 Encounter for other preprocedural examination: Secondary | ICD-10-CM

## 2017-03-31 DIAGNOSIS — I472 Ventricular tachycardia: Secondary | ICD-10-CM

## 2017-03-31 DIAGNOSIS — R002 Palpitations: Secondary | ICD-10-CM | POA: Diagnosis not present

## 2017-03-31 DIAGNOSIS — I491 Atrial premature depolarization: Secondary | ICD-10-CM | POA: Diagnosis not present

## 2017-03-31 DIAGNOSIS — I2510 Atherosclerotic heart disease of native coronary artery without angina pectoris: Secondary | ICD-10-CM

## 2017-03-31 DIAGNOSIS — I4729 Other ventricular tachycardia: Secondary | ICD-10-CM

## 2017-03-31 MED ORDER — CARVEDILOL 6.25 MG PO TABS: 6.2500 mg | ORAL_TABLET | Freq: Two times a day (BID) | ORAL | 2 refills | Status: DC

## 2017-03-31 NOTE — Patient Instructions (Addendum)
Medication Instructions:  Your physician has recommended you make the following change in your medication:  1.  STOP the Metoprolol 2.  START Carvedilol 6.25 taking 1 tablet twice a day  Labwork: TODAY:  CMET, TSH, & MAGNESIUM  Testing/Procedures:   Follow-Up: Your physician recommends that you schedule a follow-up appointment in: 3 MONTHS WITH DR. Acie Fredrickson   Any Other Special Instructions Will Be Listed Below (If Applicable).  Monitor your blood pressure momentarily, once a day or every other day.  Call our office if you see that is is running over 130 on top and over 80 on the bottom.  Jodelle Red is the name of that smart phone heart monitor   If you need a refill on your cardiac medications before your next appointment, please call your pharmacy.

## 2017-03-31 NOTE — Telephone Encounter (Signed)
   Primary Cardiologist: Mertie Moores, MD  Chart reviewed as part of pre-operative protocol coverage, requesting to review for rotator cuff repair and being asked to hold Plavix and aspirin.  Unfortunately we are not able to grant clearance at this time. The patient had orbital atherectomy and drug-eluting stent placement on 01/15/17 and it was recommended she continue dual antiplatelet therapy with aspirin and Plavix uninterrupted for at least 6 months, ideally longer. Therefore she is not able to have any surgeries that require holding of blood thinners until at least after 07/16/17. Even then, this would need to be cleared through her primary cardiologist before she can formally come off of this. She will have a follow-up in approximately 3 months in our office with Dr. Acie Fredrickson. Alternative non-invasive management strategies should be considered. The patient was made aware in clinic today - office note also faxed to Dr. Alvester Morin office. Will also cc to Dr. Acie Fredrickson to make him aware.  I will route this recommendation to the requesting party via Epic fax function and remove from pre-op pool.  Please call with questions.  Charlie Pitter, PA-C 03/31/2017, 12:22 PM

## 2017-04-01 ENCOUNTER — Encounter (HOSPITAL_COMMUNITY)
Admission: RE | Admit: 2017-04-01 | Discharge: 2017-04-01 | Disposition: A | Discharge status: Home / Self Care | Payer: Medicare Other | Source: Ambulatory Visit | Attending: Cardiovascular Disease | Admitting: Cardiovascular Disease

## 2017-04-01 DIAGNOSIS — M1990 Unspecified osteoarthritis, unspecified site: Secondary | ICD-10-CM | POA: Diagnosis not present

## 2017-04-01 DIAGNOSIS — Z955 Presence of coronary angioplasty implant and graft: Secondary | ICD-10-CM

## 2017-04-01 DIAGNOSIS — D649 Anemia, unspecified: Secondary | ICD-10-CM | POA: Diagnosis not present

## 2017-04-01 DIAGNOSIS — Z48812 Encounter for surgical aftercare following surgery on the circulatory system: Secondary | ICD-10-CM | POA: Diagnosis not present

## 2017-04-01 DIAGNOSIS — J45909 Unspecified asthma, uncomplicated: Secondary | ICD-10-CM | POA: Diagnosis not present

## 2017-04-01 DIAGNOSIS — F419 Anxiety disorder, unspecified: Secondary | ICD-10-CM | POA: Diagnosis not present

## 2017-04-01 DIAGNOSIS — Z9861 Coronary angioplasty status: Secondary | ICD-10-CM | POA: Diagnosis not present

## 2017-04-01 LAB — COMPREHENSIVE METABOLIC PANEL
A/G RATIO: 2 (ref 1.2–2.2)
ALT: 15 IU/L (ref 0–32)
AST: 16 IU/L (ref 0–40)
Albumin: 4.5 g/dL (ref 3.6–4.8)
Alkaline Phosphatase: 119 IU/L — ABNORMAL HIGH (ref 39–117)
BUN/Creatinine Ratio: 15 (ref 12–28)
BUN: 11 mg/dL (ref 8–27)
Bilirubin Total: 0.3 mg/dL (ref 0.0–1.2)
CALCIUM: 9.2 mg/dL (ref 8.7–10.3)
CO2: 24 mmol/L (ref 20–29)
CREATININE: 0.71 mg/dL (ref 0.57–1.00)
Chloride: 104 mmol/L (ref 96–106)
GFR, EST AFRICAN AMERICAN: 100 mL/min/{1.73_m2} (ref >59)
GFR, EST NON AFRICAN AMERICAN: 87 mL/min/{1.73_m2} (ref >59)
Globulin, Total: 2.2 g/dL (ref 1.5–4.5)
Glucose: 119 mg/dL — ABNORMAL HIGH (ref 65–99)
Potassium: 4.6 mmol/L (ref 3.5–5.2)
SODIUM: 143 mmol/L (ref 134–144)
TOTAL PROTEIN: 6.7 g/dL (ref 6.0–8.5)

## 2017-04-01 LAB — TSH: TSH: 1.1 u[IU]/mL (ref 0.450–4.500)

## 2017-04-01 LAB — MAGNESIUM: Magnesium: 1.7 mg/dL (ref 1.6–2.3)

## 2017-04-01 LAB — GLUCOSE, CAPILLARY: Glucose-Capillary: 127 mg/dL — ABNORMAL HIGH (ref 65–99)

## 2017-04-02 ENCOUNTER — Telehealth: Payer: Self-pay | Admitting: *Deleted

## 2017-04-02 DIAGNOSIS — Z79899 Other long term (current) drug therapy: Secondary | ICD-10-CM

## 2017-04-02 MED ORDER — MAGNESIUM OXIDE 400 MG PO TABS: 400.0000 mg | ORAL_TABLET | Freq: Every day | ORAL | 1 refills | Status: DC

## 2017-04-02 NOTE — Telephone Encounter (Signed)
-----  Message from Charlie Pitter, [place] sent at 04/01/2017 11:21 AM EST ----- Please let patient know labs were normal except a few outliers. - blood sugar consistently elevated. Do not see hx of diabetes documented in chart so she should make sure she's following with PCP for evaluation of this. If not diabetic is likely pre-diabetic. Alk phos level minimally elevated - can be a marker of liver and bone issues. However this is only 2 points above normal and LFTs otherwise normal. Can also f/u PCP to trend. - Given hx of palpitations, magnesium is suboptimal. We like this >2. Its 1.7. Please increase dietary intake of healthy sources of magnesium including leafy greens, nuts, seeds, fish, beans, whole grains, avocados, yogurt, and bananas. Would also advise starting MagOx '400mg'$  daily with recheck Mg in 1 week.  Lisbeth Renshaw Dunn PA-C

## 2017-04-02 NOTE — Progress Notes (Signed)
Cardiac Individual Treatment Plan  Patient Details  Name: Sheila Reynolds MRN: [id number] Date of Birth: 1945/09/13 Referring Provider:     CARDIAC REHAB PHASE II ORIENTATION from 02/24/2017 in [place]  Referring Provider  [name], Philip MD      Initial Encounter Date:    CARDIAC REHAB PHASE II ORIENTATION from 02/24/2017 in [place]  Date  02/24/17  Referring Provider  [name], Arnette Norris MD      Visit Diagnosis: Status post coronary artery stent placement  Patient's Home Medications on Admission:  Current Outpatient Medications:  .  acetaminophen (TYLENOL) 500 MG tablet, Take 1,000 mg by mouth 2 (two) times daily as needed for moderate pain or headache., Disp: , Rfl:  .  albuterol (PROVENTIL) (2.5 MG/3ML) 0.083% nebulizer solution, Take 2.5 mg by nebulization daily as needed for wheezing or shortness of breath. , Disp: , Rfl:  .  aspirin EC 81 MG tablet, Take 81 mg by mouth at bedtime. , Disp: , Rfl:  .  atorvastatin (LIPITOR) 40 MG tablet, Take 1 tablet (40 mg total) by mouth daily., Disp: 30 tablet, Rfl: 6 .  benzonatate (TESSALON) 200 MG capsule, Take 1 capsule (200 mg total) by mouth 3 (three) times daily as needed for cough., Disp: 30 capsule, Rfl: 1 .  brimonidine (ALPHAGAN P) 0.1 % SOLN, Place 1 drop into the left eye 3 (three) times daily. , Disp: , Rfl:  .  brinzolamide (AZOPT) 1 % ophthalmic suspension, Place 1 drop into the left eye 3 (three) times daily. , Disp: , Rfl:  .  carvedilol (COREG) 6.25 MG tablet, Take 1 tablet (6.25 mg total) by mouth 2 (two) times daily., Disp: 60 tablet, Rfl: 2 .  clopidogrel (PLAVIX) 75 MG tablet, Take 1 tablet (75 mg total) by mouth daily., Disp: 30 tablet, Rfl: 11 .  furosemide (LASIX) 20 MG tablet, Take 1 tablet (20 mg total) by mouth every 3 (three) days., Disp: 30 tablet, Rfl: 5 .  guaiFENesin (ROBITUSSIN) 100 MG/5ML SOLN, Take 5 mLs by mouth every 6 (six) hours as needed  for cough or to loosen phlegm. , Disp: , Rfl:  .  isosorbide mononitrate (IMDUR) 30 MG 24 hr tablet, Take 1 tablet (30 mg total) by mouth daily., Disp: 30 tablet, Rfl: 11 .  ketorolac (ACULAR) 0.5 % ophthalmic solution, Place 1 drop into the left eye 4 (four) times daily., Disp: , Rfl:  .  loteprednol (LOTEMAX) 0.5 % ophthalmic suspension, Place 3-4 drops into both eyes See admin instructions. Instill 4 drops into the left eye once daily, and 3 drops in the right eye once daily, Disp: , Rfl:  .  magnesium oxide (MAG-OX) 400 MG tablet, Take 1 tablet (400 mg total) by mouth daily., Disp: 90 tablet, Rfl: 1 .  Menthol, Topical Analgesic, (BIOFREEZE EX), Apply 1 application topically daily as needed (foot pain)., Disp: , Rfl:  .  montelukast (SINGULAIR) 10 MG tablet, Take 10 mg by mouth at bedtime., Disp: , Rfl:  .  nitroGLYCERIN (NITROSTAT) 0.4 MG SL tablet, Place 1 tablet (0.4 mg total) under the tongue every 5 (five) minutes as needed for chest pain., Disp: 25 tablet, Rfl: prn .  olmesartan (BENICAR) 20 MG tablet, Take 20 mg by mouth daily., Disp: , Rfl:  .  pantoprazole (PROTONIX) 40 MG tablet, Take 1 tablet (40 mg total) by mouth daily., Disp: 30 tablet, Rfl: 6 .  pregabalin (LYRICA) 75 MG capsule, Take 75 mg  by mouth at bedtime., Disp: , Rfl:  .  PROAIR HFA 108 (90 Base) MCG/ACT inhaler, Inhale 2 puffs into the lungs every 6 (six) hours as needed for wheezing., Disp: 1 Inhaler, Rfl: 12 .  sitaGLIPtin-metformin (JANUMET) 50-500 MG tablet, Take 1 tablet by mouth as directed. Take 1 tablet twice daily on Monday through Friday, take 1 tablet daily on Saturdays and Sundays, Disp: , Rfl:  .  timolol (BETIMOL) 0.5 % ophthalmic solution, Place 1 drop into the left eye 2 (two) times daily., Disp: , Rfl:  .  tolnaftate (TINACTIN) 1 % cream, Apply 1 application topically daily as needed (foot itching)., Disp: , Rfl:  .  traMADol (ULTRAM) 50 MG tablet, Take 1 tablet (50 mg total) by mouth every 6 (six) hours as  needed., Disp: 40 tablet, Rfl: 0 .  travoprost, benzalkonium, (TRAVATAN) 0.004 % ophthalmic solution, Place 1 drop into both eyes at bedtime., Disp: , Rfl:   Past Medical History: Past Medical History:  Diagnosis Date  . Anemia    3 months ago anemic  . Anxiety    on meds  . Arthritis    "all over" (01/15/2017)  . Asthma   . Bronchitis with emphysema   . Chest pain   . Chronic bronchitis ([place])   . Coronary artery disease    a. 01/2017 she underwent orbital atherectomy/DES to the proxmal LAD and PTCA to ostial D2. 2D Echo 01/15/17 showed mild LVH, EF 60-65%, grade 1 DD.  . Family history of anesthesia complication    daughter N/V  . Fibromyalgia   . GERD (gastroesophageal reflux disease)    on meds  . Heart murmur   . History of hiatal hernia   . Hx of echocardiogram    Echo (03/2013):  Tech limited; Mild focal basal septal hypertrophy, EF 60-65%, normal RVF  . Hyperlipidemia   . Hypertension   . Nonsustained ventricular tachycardia ([place])   . OSA on CPAP   . Premature atrial contractions   . PVC (premature ventricular contraction)    a. Holter 12/16: NSR, occ PAC,PVCs  . Sarcoidosis   . Type II diabetes mellitus ([facility])     Tobacco Use: Social History   Tobacco Use  Smoking Status Former Smoker  . Packs/day: 1.00  . Years: 4.00  . Pack years: 4.00  . Types: Cigarettes  . Last attempt to quit: 02/11/1980  . Years since quitting: 37.1  Smokeless Tobacco Never Used    Labs: Recent Review Flowsheet Data    Labs for ITP Cardiac and Pulmonary Rehab Latest Ref Rng & Units 09/01/2012 05/24/2014 05/07/2016 12/12/2016 12/12/2016   Cholestrol 100 - 199 mg/dL 112 157 133 - -   LDLCALC 0 - 99 mg/dL 58 85 67 - -   HDL >39 mg/dL 37.70(L) 51.70 50 - -   Trlycerides 0 - 149 mg/dL 83.0 104.0 80 - -   PHART 7.350 - 7.450 - - - - 7.395   PCO2ART 32.0 - 48.0 mmHg - - - - 40.7   HCO3 20.0 - 28.0 mmol/L - - - 26.7 24.9   TCO2 22 - 32 mmol/L - - - 28 26   O2SAT % - - - 71.0 97.0       Capillary Blood Glucose: Lab Results  Component Value Date   GLUCAP 127 (H) 04/01/2017   GLUCAP 120 (H) 03/20/2017   GLUCAP 164 (H) 03/20/2017   GLUCAP 125 (H) 03/18/2017   GLUCAP 195 (H) 03/18/2017     Exercise  Target Goals:    Exercise Program Goal: Individual exercise prescription set using results from initial 6 min walk test and THRR while considering  patient's activity barriers and safety.   Exercise Prescription Goal: Initial exercise prescription builds to 30-45 minutes a day of aerobic activity, 2-3 days per week.  Home exercise guidelines will be given to patient during program as part of exercise prescription that the participant will acknowledge.  Activity Barriers & Risk Stratification: Activity Barriers & Cardiac Risk Stratification - 02/24/17 0957      Activity Barriers & Cardiac Risk Stratification   Activity Barriers  Arthritis;Joint Problems;Deconditioning;Muscular Weakness;Shortness of Breath;Other (comment)    Comments  R shoulder/arm pain; L shoulder limitation    Cardiac Risk Stratification  High       6 Minute Walk: 6 Minute Walk    Row Name 02/24/17 1056         6 Minute Walk   Phase  Initial     Distance  1270 feet     Walk Time  6 minutes     # of Rest Breaks  0     MPH  2.4     METS  2.8     RPE  11     VO2 Peak  9.8     Symptoms  Yes (comment)     Comments  knee pain, neck pain an d R arm pain (denied chest pain)     Resting HR  65 bpm     Resting BP  122/60     Resting Oxygen Saturation   98 %     Exercise Oxygen Saturation  during 6 min walk  98 %     Max Ex. HR  105 bpm     Max Ex. BP  144/80     2 Minute Post BP  122/84        Oxygen Initial Assessment:   Oxygen Re-Evaluation:   Oxygen Discharge (Final Oxygen Re-Evaluation):   Initial Exercise Prescription: Initial Exercise Prescription - 02/24/17 1000      Date of Initial Exercise RX and Referring Provider   Date  02/24/17    Referring Provider  [name],  Philip MD      Treadmill   MPH  2    Grade  0    Minutes  10    METs  2.53      Recumbant Bike   Level  1.5    Minutes  10    METs  1.5      NuStep   Level  2    SPM  60    Minutes  10    METs  1.7      Prescription Details   Frequency (times per week)  3    Duration  Progress to 30 minutes of continuous aerobic without signs/symptoms of physical distress      Intensity   THRR 40-80% of Max Heartrate  60-129    Ratings of Perceived Exertion  11-15    Perceived Dyspnea  0-4      Progression   Progression  Continue to progress workloads to maintain intensity without signs/symptoms of physical distress.      Resistance Training   Training Prescription  Yes    Weight  1lb    Reps  10-15       Perform Capillary Blood Glucose checks as needed.  Exercise Prescription Changes:  Exercise Prescription Changes    Row Name 03/02/17 1600 03/18/17 1620 04/01/17  1400         Response to Exercise   Blood Pressure (Admit)  122/68  124/70  120/80     Blood Pressure (Exercise)  162/82  122/80  142/78     Blood Pressure (Exit)  140/60  118/70  128/80     Heart Rate (Admit)  73 bpm  74 bpm  78 bpm     Heart Rate (Exercise)  120 bpm  113 bpm  115 bpm     Heart Rate (Exit)  65 bpm  85 bpm  80 bpm     Rating of Perceived Exertion (Exercise)  14  12  11      Perceived Dyspnea (Exercise)  0  -  -     Comments  Pt is uncomfortable on Treadmill. Will have patient walk on track moving forward.  -  -     Duration  Progress to 30 minutes of  aerobic without signs/symptoms of physical distress  Progress to 30 minutes of  aerobic without signs/symptoms of physical distress  Continue with 30 min of aerobic exercise without signs/symptoms of physical distress.     Intensity  THRR New  THRR unchanged  THRR unchanged       Progression   Progression  Continue to progress workloads to maintain intensity without signs/symptoms of physical distress.  Continue to progress workloads to maintain  intensity without signs/symptoms of physical distress.  Continue to progress workloads to maintain intensity without signs/symptoms of physical distress.     Average METs  2.1  2.1  2.67       Resistance Training   Training Prescription  No  No  No       Treadmill   MPH  1.7  -  -     Grade  0  -  -     Minutes  5  -  -     METs  2.3  -  -       Recumbant Bike   Level  1.5  1.5  -     Minutes  10  10  -     METs  2  2  -       NuStep   Level  2  2  4      SPM  60  80  80     Minutes  10  10  15      METs  2.2  2.1  2.6       Track   Laps  -  10  15     Minutes  -  10  10     METs  -  2.04  2.74       Home Exercise Plan   Plans to continue exercise at  -  [employer] (comment) Walking, [place] (comment) Walking, [place] 2 additional days to program exercise sessions.  Add 2 additional days to program exercise sessions.     Initial Home Exercises Provided  -  03/18/17  03/18/17        Exercise Comments:  Exercise Comments    Row Name 03/02/17 1651 03/18/17 1629 04/01/17 1451       Exercise Comments  Pt oriented to exericse equipement. Patient is off to a good start. Will continue to monitor and progress patient.   Reviewed HEP with pt today. Pt plans to walk for exercise 2 days a week  for 30 minutes. Pt also has bike at home and [employer].   Reviewed METs and Goals with pt. Will continue to monitor pt. Pt is tolerating well exercise prescription well.         Exercise Goals and Review:  Exercise Goals    Row Name 02/24/17 0954 02/24/17 0959 02/24/17 1102         Exercise Goals   Increase Physical Activity  Yes  -  -     Intervention  Provide advice, education, support and counseling about physical activity/exercise needs.;Develop an individualized exercise prescription for aerobic and resistive training based on initial evaluation findings, risk stratification, comorbidities and  participant's personal goals.  -  -     Expected Outcomes  Achievement of increased cardiorespiratory fitness and enhanced flexibility, muscular endurance and strength shown through measurements of functional capacity and personal statement of participant.  -  -     Increase Strength and Stamina  Yes  - be able to climb stairs without SOB, improve energy levels and desire to exercise  Yes     Intervention  Provide advice, education, support and counseling about physical activity/exercise needs.;Develop an individualized exercise prescription for aerobic and resistive training based on initial evaluation findings, risk stratification, comorbidities and participant's personal goals.  -  -     Expected Outcomes  Achievement of increased cardiorespiratory fitness and enhanced flexibility, muscular endurance and strength shown through measurements of functional capacity and personal statement of participant.  -  -     Able to understand and use rate of perceived exertion (RPE) scale  Yes  -  -     Intervention  Provide education and explanation on how to use RPE scale  -  -     Expected Outcomes  Short Term: Able to use RPE daily in rehab to express subjective intensity level;Long Term:  Able to use RPE to guide intensity level when exercising independently  -  -     Knowledge and understanding of Target Heart Rate Range (THRR)  Yes  -  -     Intervention  Provide education and explanation of THRR including how the numbers were predicted and where they are located for reference  -  -     Expected Outcomes  Long Term: Able to use THRR to govern intensity when exercising independently;Short Term: Able to state/look up THRR;Short Term: Able to use daily as guideline for intensity in rehab  -  -     Able to check pulse independently  Yes  -  -     Intervention  Provide education and demonstration on how to check pulse in carotid and radial arteries.;Review the importance of being able to check your own pulse for  safety during independent exercise  -  -     Expected Outcomes  Short Term: Able to explain why pulse checking is important during independent exercise;Long Term: Able to check pulse independently and accurately  -  -     Understanding of Exercise Prescription  Yes  -  -     Intervention  Provide education, explanation, and written materials on patient's individual exercise prescription  -  -     Expected Outcomes  Short Term: Able to explain program exercise prescription;Long Term: Able to explain home exercise prescription to exercise independently  -  -        Exercise Goals Re-Evaluation : Exercise Goals Re-Evaluation    Row Name 04/01/17 1456  Exercise Goal Re-Evaluation   Exercise Goals Review  Increase Physical Activity;Increase Strength and Stamina;Understanding of Exercise Prescription;Able to check pulse independently;Able to understand and use rate of perceived exertion (RPE) scale;Knowledge and understanding of Target Heart Rate Range (THRR)       Comments  Pt is tolerating exercise prescription. Pt is able to exercise for 30 minutes with minimal difficulty. Will continue to monitor and progress pt.        Expected Outcomes  Will continue to increase cardiorespiratory fitness and workloads as pt tolerated.            Discharge Exercise Prescription (Final Exercise Prescription Changes): Exercise Prescription Changes - 04/01/17 1400      Response to Exercise   Blood Pressure (Admit)  120/80    Blood Pressure (Exercise)  142/78    Blood Pressure (Exit)  128/80    Heart Rate (Admit)  78 bpm    Heart Rate (Exercise)  115 bpm    Heart Rate (Exit)  80 bpm    Rating of Perceived Exertion (Exercise)  11    Duration  Continue with 30 min of aerobic exercise without signs/symptoms of physical distress.    Intensity  THRR unchanged      Progression   Progression  Continue to progress workloads to maintain intensity without signs/symptoms of physical distress.     Average METs  2.67      Resistance Training   Training Prescription  No      NuStep   Level  4    SPM  80    Minutes  15    METs  2.6      Track   Laps  15    Minutes  10    METs  2.74      Home Exercise Plan   Plans to continue exercise at  [employer] (comment) [facility]    Frequency  Add 2 additional days to program exercise sessions.    Initial Home Exercises Provided  03/18/17       Nutrition:  Target Goals: Understanding of nutrition guidelines, daily intake of sodium 1500mg , cholesterol 200mg , calories 30% from fat and 7% or less from saturated fats, daily to have 5 or more servings of fruits and vegetables.  Biometrics: Pre Biometrics - 02/24/17 1102      Pre Biometrics   Height  5\' 2"  (1.575 m)    Weight  166 lb 14.2 oz (75.7 kg)    Waist Circumference  38.5 inches    Hip Circumference  41.5 inches    Waist to Hip Ratio  0.93 %    BMI (Calculated)  30.52    Triceps Skinfold  47 mm    % Body Fat  45 %    Grip Strength  22 kg    Flexibility  0 in    Single Leg Stand  3.21 seconds        Nutrition Therapy Plan and Nutrition Goals: Nutrition Therapy & Goals - 02/25/17 1117      Nutrition Therapy   Diet  Carb Modified, Heart Healthy      Personal Nutrition Goals   Nutrition Goal  Pt to identify food quantities necessary to achieve weight loss of 6-15 lb at graduation from cardiac rehab.      Intervention Plan   Intervention  Prescribe, educate and counsel regarding individualized specific dietary modifications aiming towards targeted core components such as weight, hypertension, lipid management, diabetes, heart failure and other comorbidities.  Expected Outcomes  Short Term Goal: Understand basic principles of dietary content, such as calories, fat, sodium, cholesterol and nutrients.;Long Term Goal: Adherence to prescribed nutrition plan.       Nutrition Assessments: Nutrition Assessments - 02/25/17 1118      MEDFICTS Scores    Pre Score  30       Nutrition Goals Re-Evaluation:   Nutrition Goals Re-Evaluation:   Nutrition Goals Discharge (Final Nutrition Goals Re-Evaluation):   Psychosocial: Target Goals: Acknowledge presence or absence of significant depression and/or stress, maximize coping skills, provide positive support system. Participant is able to verbalize types and ability to use techniques and skills needed for reducing stress and depression.  Initial Review & Psychosocial Screening: Initial Psych Review & Screening - 02/24/17 1102      Initial Review   Current issues with  Current Anxiety/Panic health related anxiety      Family Dynamics   Good Support System?  Yes    Comments  spouse, children, friends       Barriers   Psychosocial barriers to participate in program  The patient should benefit from training in stress management and relaxation.      Screening Interventions   Interventions  Encouraged to exercise;Provide feedback about the scores to participant;To provide support and resources with identified psychosocial needs       Quality of Life Scores: Quality of Life - 02/24/17 1103      Quality of Life Scores   Health/Function Pre  18.7 %    Socioeconomic Pre  20.38 %    Psych/Spiritual Pre  23.14 %    Family Pre  22.8 %    GLOBAL Pre  20.56 %      Scores of 19 and below usually indicate a poorer quality of life in these areas.  A difference of  2-3 points is a clinically meaningful difference.  A difference of 2-3 points in the total score of the Quality of Life Index has been associated with significant improvement in overall quality of life, self-image, physical symptoms, and general health in studies assessing change in quality of life.  PHQ-9: Recent Review Flowsheet Data    Depression screen [facility] 2/9 03/02/2017 01/27/2017   Decreased Interest 0 0   Down, Depressed, Hopeless 0 1    PHQ - 2 Score 0 1     Interpretation of Total Score  Total Score Depression  Severity:  1-4 = Minimal depression, 5-9 = Mild depression, 10-14 = Moderate depression, 15-19 = Moderately severe depression, 20-27 = Severe depression   Psychosocial Evaluation and Intervention:   Psychosocial Re-Evaluation: Psychosocial Re-Evaluation    Sheila Reynolds Name 04/02/17 1133             Psychosocial Re-Evaluation   Current issues with  Current Stress Concerns       Comments  Sheila Reynolds has been dealing with health concerns regarding her right shoulder and needs surgery she cant  have surgery until June       Expected Outcomes  Will offer emotional support as needed encourage the patient to partcipate in relaxation exercises.       Interventions  Stress management education;Encouraged to attend Cardiac Rehabilitation for the exercise       Continue Psychosocial Services   No Follow up required         Initial Review   Source of Stress Concerns  Chronic Illness          Psychosocial Discharge (Final Psychosocial Re-Evaluation): Psychosocial Re-Evaluation - 04/02/17  22      Psychosocial Re-Evaluation   Current issues with  Current Stress Concerns    Comments  Sheila Reynolds has been dealing with health concerns regarding her right shoulder and needs surgery she cant  have surgery until June    Expected Outcomes  Will offer emotional support as needed encourage the patient to partcipate in relaxation exercises.    Interventions  Stress management education;Encouraged to attend Cardiac Rehabilitation for the exercise    Continue Psychosocial Services   No Follow up required      Initial Review   Source of Stress Concerns  Chronic Illness       Vocational Rehabilitation: Provide vocational rehab assistance to qualifying candidates.   Vocational Rehab Evaluation & Intervention: Vocational Rehab - 02/24/17 1101      Initial Vocational Rehab Evaluation & Intervention   Assessment shows need for Vocational Rehabilitation  No returning to work       Education: Education  Goals: Education classes will be provided on a weekly basis, covering required topics. Participant will state understanding/return demonstration of topics presented.  Learning Barriers/Preferences: Learning Barriers/Preferences - 02/24/17 1056      Learning Barriers/Preferences   Learning Barriers  Sight;Hearing       Education Topics: Count Your Pulse:  -Group instruction provided by verbal instruction, demonstration, patient participation and written materials to support subject.  Instructors address importance of being able to find your pulse and how to count your pulse when at home without a heart monitor.  Patients get hands on experience counting their pulse with staff help and individually.   CARDIAC REHAB PHASE II EXERCISE from 03/18/2017 in [place]  Date  03/06/17  Educator  RN      Heart Attack, Angina, and Risk Factor Modification:  -Group instruction provided by verbal instruction, video, and written materials to support subject.  Instructors address signs and symptoms of angina and heart attacks.    Also discuss risk factors for heart disease and how to make changes to improve heart health risk factors.   Functional Fitness:  -Group instruction provided by verbal instruction, demonstration, patient participation, and written materials to support subject.  Instructors address safety measures for doing things around the house.  Discuss how to get up and down off the floor, how to pick things up properly, how to safely get out of a chair without assistance, and balance training.   Meditation and Mindfulness:  -Group instruction provided by verbal instruction, patient participation, and written materials to support subject.  Instructor addresses importance of mindfulness and meditation practice to help reduce stress and improve awareness.  Instructor also leads participants through a meditation exercise.    Stretching for Flexibility and  Mobility:  -Group instruction provided by verbal instruction, patient participation, and written materials to support subject.  Instructors lead participants through series of stretches that are designed to increase flexibility thus improving mobility.  These stretches are additional exercise for major muscle groups that are typically performed during regular warm up and cool down.   Hands Only CPR:  -Group verbal, video, and participation provides a basic overview of AHA guidelines for community CPR. Role-play of emergencies allow participants the opportunity to practice calling for help and chest compression technique with discussion of AED use.   CARDIAC REHAB PHASE II EXERCISE from 03/18/2017 in [place]  Date  03/13/17  Instruction Review Code  2- Demonstrated Understanding      Hypertension: -Group  verbal and written instruction that provides a basic overview of hypertension including the most recent diagnostic guidelines, risk factor reduction with self-care instructions and medication management.    Nutrition I class: Heart Healthy Eating:  -Group instruction provided by PowerPoint slides, verbal discussion, and written materials to support subject matter. The instructor gives an explanation and review of the Therapeutic Lifestyle Changes diet recommendations, which includes a discussion on lipid goals, dietary fat, sodium, fiber, plant stanol/sterol esters, sugar, and the components of a well-balanced, healthy diet.   Nutrition II class: Lifestyle Skills:  -Group instruction provided by PowerPoint slides, verbal discussion, and written materials to support subject matter. The instructor gives an explanation and review of label reading, grocery shopping for heart health, heart healthy recipe modifications, and ways to make healthier choices when eating out.   Diabetes Question & Answer:  -Group instruction provided by PowerPoint slides, verbal  discussion, and written materials to support subject matter. The instructor gives an explanation and review of diabetes co-morbidities, pre- and post-prandial blood glucose goals, pre-exercise blood glucose goals, signs, symptoms, and treatment of hypoglycemia and hyperglycemia, and foot care basics.   Diabetes Blitz:  -Group instruction provided by PowerPoint slides, verbal discussion, and written materials to support subject matter. The instructor gives an explanation and review of the physiology behind type 1 and type 2 diabetes, diabetes medications and rational behind using different medications, pre- and post-prandial blood glucose recommendations and Hemoglobin A1c goals, diabetes diet, and exercise including blood glucose guidelines for exercising safely.    Portion Distortion:  -Group instruction provided by PowerPoint slides, verbal discussion, written materials, and food models to support subject matter. The instructor gives an explanation of serving size versus portion size, changes in portions sizes over the last 20 years, and what consists of a serving from each food group.   CARDIAC REHAB PHASE II EXERCISE from 03/18/2017 in [place]  Date  03/11/17  Educator  RD  Instruction Review Code  2- Demonstrated Understanding      Stress Management:  -Group instruction provided by verbal instruction, video, and written materials to support subject matter.  Instructors review role of stress in heart disease and how to cope with stress positively.     CARDIAC REHAB PHASE II EXERCISE from 03/18/2017 in [place]  Date  03/18/17  Instruction Review Code  2- Demonstrated Understanding      Exercising on Your Own:  -Group instruction provided by verbal instruction, power point, and written materials to support subject.  Instructors discuss benefits of exercise, components of exercise, frequency and intensity of exercise, and end  points for exercise.  Also discuss use of nitroglycerin and activating EMS.  Review options of places to exercise outside of rehab.  Review guidelines for sex with heart disease.   Cardiac Drugs I:  -Group instruction provided by verbal instruction and written materials to support subject.  Instructor reviews cardiac drug classes: antiplatelets, anticoagulants, beta blockers, and statins.  Instructor discusses reasons, side effects, and lifestyle considerations for each drug class.   Cardiac Drugs II:  -Group instruction provided by verbal instruction and written materials to support subject.  Instructor reviews cardiac drug classes: angiotensin converting enzyme inhibitors (ACE-I), angiotensin II receptor blockers (ARBs), nitrates, and calcium channel blockers.  Instructor discusses reasons, side effects, and lifestyle considerations for each drug class.   Anatomy and Physiology of the Circulatory System:  Group verbal and written instruction and models provide basic cardiac anatomy  and physiology, with the coronary electrical and arterial systems. Review of: AMI, Angina, Valve disease, Heart Failure, Peripheral Artery Disease, Cardiac Arrhythmia, Pacemakers, and the ICD.   Other Education:  -Group or individual verbal, written, or video instructions that support the educational goals of the cardiac rehab program.   Holiday Eating Survival Tips:  -Group instruction provided by PowerPoint slides, verbal discussion, and written materials to support subject matter. The instructor gives patients tips, tricks, and techniques to help them not only survive but enjoy the holidays despite the onslaught of food that accompanies the holidays.   Knowledge Questionnaire Score: Knowledge Questionnaire Score - 02/24/17 1101      Knowledge Questionnaire Score   Pre Score  20/24       Core Components/Risk Factors/Patient Goals at Admission: Personal Goals and Risk Factors at Admission - 02/24/17 1103       Core Components/Risk Factors/Patient Goals on Admission    Weight Management  Yes;Obesity;Weight Maintenance;Weight Loss    Intervention  Weight Management: Develop a combined nutrition and exercise program designed to reach desired caloric intake, while maintaining appropriate intake of nutrient and fiber, sodium and fats, and appropriate energy expenditure required for the weight goal.;Weight Management: Provide education and appropriate resources to help participant work on and attain dietary goals.;Weight Management/Obesity: Establish reasonable short term and long term weight goals.;Obesity: Provide education and appropriate resources to help participant work on and attain dietary goals.    Admit Weight  166 lb 14.2 oz (75.7 kg)    Goal Weight: Short Term  160 lb (72.6 kg)    Goal Weight: Long Term  150 lb (68 kg)    Expected Outcomes  Short Term: Continue to assess and modify interventions until short term weight is achieved;Long Term: Adherence to nutrition and physical activity/exercise program aimed toward attainment of established weight goal;Weight Maintenance: Understanding of the daily nutrition guidelines, which includes 25-35% calories from fat, 7% or less cal from saturated fats, less than 200mg  cholesterol, less than 1.5gm of sodium, & 5 or more servings of fruits and vegetables daily;Weight Loss: Understanding of general recommendations for a balanced deficit meal plan, which promotes 1-2 lb weight loss per week and includes a negative energy balance of [phone] kcal/d;Understanding recommendations for meals to include 15-35% energy as protein, 25-35% energy from fat, 35-60% energy from carbohydrates, less than 200mg  of dietary cholesterol, 20-35 gm of total fiber daily;Understanding of distribution of calorie intake throughout the day with the consumption of 4-5 meals/snacks    Improve shortness of breath with ADL's  Yes    Intervention  Provide education, individualized exercise  plan and daily activity instruction to help decrease symptoms of SOB with activities of daily living.    Expected Outcomes  Short Term: Achieves a reduction of symptoms when performing activities of daily living.    Diabetes  Yes    Intervention  Provide education about signs/symptoms and action to take for hypo/hyperglycemia.;Provide education about proper nutrition, including hydration, and aerobic/resistive exercise prescription along with prescribed medications to achieve blood glucose in normal ranges: Fasting glucose 65-99 mg/dL    Expected Outcomes  Long Term: Attainment of HbA1C < 7%.;Short Term: Participant verbalizes understanding of the signs/symptoms and immediate care of hyper/hypoglycemia, proper foot care and importance of medication, aerobic/resistive exercise and nutrition plan for blood glucose control.    Hypertension  Yes    Intervention  Provide education on lifestyle modifcations including regular physical activity/exercise, weight management, moderate sodium restriction and increased consumption of fresh fruit,  vegetables, and low fat dairy, alcohol moderation, and smoking cessation.;Monitor prescription use compliance.    Expected Outcomes  Short Term: Continued assessment and intervention until BP is < 140/37mm HG in hypertensive participants. < 130/49mm HG in hypertensive participants with diabetes, heart failure or chronic kidney disease.;Long Term: Maintenance of blood pressure at goal levels.    Lipids  Yes    Intervention  Provide education and support for participant on nutrition & aerobic/resistive exercise along with prescribed medications to achieve LDL 70mg , HDL >40mg .    Expected Outcomes  Short Term: Participant states understanding of desired cholesterol values and is compliant with medications prescribed. Participant is following exercise prescription and nutrition guidelines.;Long Term: Cholesterol controlled with medications as prescribed, with individualized  exercise RX and with personalized nutrition plan. Value goals: LDL < 70mg , HDL > 40 mg.    Stress  Yes    Intervention  Offer individual and/or small group education and counseling on adjustment to heart disease, stress management and health-related lifestyle change. Teach and support self-help strategies.;Refer participants experiencing significant psychosocial distress to appropriate mental health specialists for further evaluation and treatment. When possible, include family members and significant others in education/counseling sessions.    Expected Outcomes  Short Term: Participant demonstrates changes in health-related behavior, relaxation and other stress management skills, ability to obtain effective social support, and compliance with psychotropic medications if prescribed.;Long Term: Emotional wellbeing is indicated by absence of clinically significant psychosocial distress or social isolation.       Core Components/Risk Factors/Patient Goals Review:  Goals and Risk Factor Review    Row Name 04/02/17 1115             Core Components/Risk Factors/Patient Goals Review   Personal Goals Review  Weight Management/Obesity;Lipids;Hypertension;Diabetes;Stress       Review  Sheila Reynolds vital signs and CBG's have been stable at cardiac rehab.       Expected Outcomes  Sheila Reynolds will continue to take her medicaiton for HTN Hyperlipidemia and Diabetes as presribed. Sheila Reynolds will continue to participate in phase 2 cardiac rehab          Core Components/Risk Factors/Patient Goals at Discharge (Final Review):  Goals and Risk Factor Review - 04/02/17 1115      Core Components/Risk Factors/Patient Goals Review   Personal Goals Review  Weight Management/Obesity;Lipids;Hypertension;Diabetes;Stress    Review  Sheila Reynolds vital signs and CBG's have been stable at cardiac rehab.    Expected Outcomes  Sheila Reynolds will continue to take her medicaiton for HTN Hyperlipidemia and Diabetes as presribed.  Sheila Reynolds will continue to participate in phase 2 cardiac rehab       ITP Comments: ITP Comments    Row Name 02/24/17 0940 04/02/17 1114         ITP Comments  Dr. Fransico Him, Medical Director  30 day ITP Review. Patient with good participation and attendance at phase 2 cardiac rehab.         Comments: See ITP comments.  Bedie has clearance from Dr Alvester Morin office to continue participate in phase 2 cardiac rehab as long as she doesn't use her Right arm. Will continue to monitor the patient throughout  the program.Maria Venetia Maxon, RN,BSN 04/02/2017 11:41 AM

## 2017-04-03 ENCOUNTER — Encounter (HOSPITAL_COMMUNITY): Payer: Medicare Other

## 2017-04-06 ENCOUNTER — Telehealth: Payer: Self-pay | Admitting: Physician Assistant

## 2017-04-06 ENCOUNTER — Encounter (HOSPITAL_COMMUNITY)
Admission: RE | Admit: 2017-04-06 | Discharge: 2017-04-06 | Disposition: A | Discharge status: Home / Self Care | Payer: Medicare Other | Source: Ambulatory Visit | Attending: Cardiovascular Disease | Admitting: Cardiovascular Disease

## 2017-04-06 DIAGNOSIS — J45909 Unspecified asthma, uncomplicated: Secondary | ICD-10-CM | POA: Diagnosis not present

## 2017-04-06 DIAGNOSIS — Z9861 Coronary angioplasty status: Secondary | ICD-10-CM | POA: Diagnosis not present

## 2017-04-06 DIAGNOSIS — Z955 Presence of coronary angioplasty implant and graft: Secondary | ICD-10-CM

## 2017-04-06 DIAGNOSIS — M1990 Unspecified osteoarthritis, unspecified site: Secondary | ICD-10-CM | POA: Diagnosis not present

## 2017-04-06 DIAGNOSIS — F419 Anxiety disorder, unspecified: Secondary | ICD-10-CM | POA: Diagnosis not present

## 2017-04-06 DIAGNOSIS — D649 Anemia, unspecified: Secondary | ICD-10-CM | POA: Diagnosis not present

## 2017-04-06 DIAGNOSIS — Z48812 Encounter for surgical aftercare following surgery on the circulatory system: Secondary | ICD-10-CM | POA: Diagnosis not present

## 2017-04-06 LAB — GLUCOSE, CAPILLARY: Glucose-Capillary: 193 mg/dL — ABNORMAL HIGH (ref 65–99)

## 2017-04-06 NOTE — Telephone Encounter (Signed)
Sheila Reynolds with cardiac rehab called cardmaster pager. Pt was requesting clarification on her benicar. I saw her in clinic recently for HTN/palpitations. We changed metoprolol to carvedilol. Apparently she thought she was supposed to stop the Benicar when she started the carvedilol. I did not advise this. She did stop the metoprolol as instructed. BPs have generally ranged in the 120/80 range with post-exercise BP after resting of 233 systolic today at cardiac rehab. Will continue to hold Benicar for now (patient hasn't taken it since last week). Given DM she may benefit from resumption of some ACEI/ARB down the road but for now will continue carvedilol given recent palpitations. F/u as planned. Will cc PCP and Dr. Acie Fredrickson to make them aware.   Matthan Sledge PA-C

## 2017-04-06 NOTE — Progress Notes (Signed)
Samya told me she has not been taking her Benicar since last week because she is unsure whether she is supposed to still take it or not. Jermany brought her bottle of Benicar and coreg. Sharrell Ku PA called and notified. I gave Brayton Mars blood pressure reading from today and her heart rate readings. Hinton Dyer told Mrs Kok to hold off on taking her Benicar for now and to continue her other medications as prescribed including her coreg. Patient states understanding and exercised without difficulty. Will continue to monitor the patient throughout  the program.Rawson Minix Venetia Maxon, RN,BSN 04/06/2017 12:47 PM

## 2017-04-08 ENCOUNTER — Encounter (HOSPITAL_COMMUNITY)
Admission: RE | Admit: 2017-04-08 | Discharge: 2017-04-08 | Disposition: A | Discharge status: Home / Self Care | Payer: Medicare Other | Source: Ambulatory Visit | Attending: Cardiovascular Disease | Admitting: Cardiovascular Disease

## 2017-04-08 DIAGNOSIS — J45909 Unspecified asthma, uncomplicated: Secondary | ICD-10-CM | POA: Diagnosis not present

## 2017-04-08 DIAGNOSIS — Z48812 Encounter for surgical aftercare following surgery on the circulatory system: Secondary | ICD-10-CM | POA: Diagnosis not present

## 2017-04-08 DIAGNOSIS — Z955 Presence of coronary angioplasty implant and graft: Secondary | ICD-10-CM

## 2017-04-08 DIAGNOSIS — M1990 Unspecified osteoarthritis, unspecified site: Secondary | ICD-10-CM | POA: Diagnosis not present

## 2017-04-08 DIAGNOSIS — Z9861 Coronary angioplasty status: Secondary | ICD-10-CM | POA: Diagnosis not present

## 2017-04-08 DIAGNOSIS — D649 Anemia, unspecified: Secondary | ICD-10-CM | POA: Diagnosis not present

## 2017-04-08 DIAGNOSIS — F419 Anxiety disorder, unspecified: Secondary | ICD-10-CM | POA: Diagnosis not present

## 2017-04-08 LAB — GLUCOSE, CAPILLARY
Glucose-Capillary: 105 mg/dL — ABNORMAL HIGH (ref 65–99)
Glucose-Capillary: 135 mg/dL — ABNORMAL HIGH (ref 65–99)

## 2017-04-09 ENCOUNTER — Other Ambulatory Visit: Payer: Medicare Other | Admitting: *Deleted

## 2017-04-09 DIAGNOSIS — E782 Mixed hyperlipidemia: Secondary | ICD-10-CM | POA: Diagnosis not present

## 2017-04-09 DIAGNOSIS — R790 Abnormal level of blood mineral: Secondary | ICD-10-CM | POA: Diagnosis not present

## 2017-04-09 DIAGNOSIS — I25119 Atherosclerotic heart disease of native coronary artery with unspecified angina pectoris: Secondary | ICD-10-CM | POA: Diagnosis not present

## 2017-04-09 LAB — LIPID PANEL
CHOL/HDL RATIO: 2.9 ratio (ref 0.0–4.4)
CHOLESTEROL TOTAL: 129 mg/dL (ref 100–199)
HDL: 44 mg/dL (ref >39)
LDL CALC: 68 mg/dL (ref 0–99)
TRIGLYCERIDES: 87 mg/dL (ref 0–149)
VLDL CHOLESTEROL CAL: 17 mg/dL (ref 5–40)

## 2017-04-09 LAB — BASIC METABOLIC PANEL
BUN/Creatinine Ratio: 15 (ref 12–28)
BUN: 12 mg/dL (ref 8–27)
CALCIUM: 9.2 mg/dL (ref 8.7–10.3)
CHLORIDE: 101 mmol/L (ref 96–106)
CO2: 24 mmol/L (ref 20–29)
Creatinine, Ser: 0.81 mg/dL (ref 0.57–1.00)
GFR, EST AFRICAN AMERICAN: 86 mL/min/{1.73_m2} (ref >59)
GFR, EST NON AFRICAN AMERICAN: 74 mL/min/{1.73_m2} (ref >59)
Glucose: 119 mg/dL — ABNORMAL HIGH (ref 65–99)
POTASSIUM: 4.3 mmol/L (ref 3.5–5.2)
SODIUM: 138 mmol/L (ref 134–144)

## 2017-04-09 LAB — HEPATIC FUNCTION PANEL
ALBUMIN: 4.3 g/dL (ref 3.6–4.8)
ALK PHOS: 130 IU/L — AB (ref 39–117)
ALT: 13 IU/L (ref 0–32)
AST: 16 IU/L (ref 0–40)
Bilirubin Total: 0.3 mg/dL (ref 0.0–1.2)
Bilirubin, Direct: 0.07 mg/dL (ref 0.00–0.40)
TOTAL PROTEIN: 6.4 g/dL (ref 6.0–8.5)

## 2017-04-10 ENCOUNTER — Telehealth: Payer: Self-pay | Admitting: Cardiovascular Disease

## 2017-04-10 ENCOUNTER — Encounter (HOSPITAL_COMMUNITY): Payer: Medicare Other

## 2017-04-10 NOTE — Telephone Encounter (Signed)
Reviewed lab results with patient. Magnesium level was not obtained as ordered by Melina Copa, PA on 2/21. Patient states she is tolerating the mag oxide without difficulty. I apologized that the mag level was not collected and advised that I will try to add it to current lab orders and let her know if we need her to return at no charge. I advised her to f/u with her PCP for her elevated alkaline phosphatase. She states she has been complaining of pain in her right breast and I advised that gallbladder problems could cause an increase in this liver enzyme. I advised her that I will notify her next week if she needs to come by for additional lab work. She verbalized understanding and agreement and thanked me for the call.

## 2017-04-10 NOTE — Telephone Encounter (Signed)
Spoke with Jasmine at [facility] who advised test will be ordered to be added on to patient's blood sample. She advised they will notify us if test is unable to be done. I thanked her for her help.

## 2017-04-10 NOTE — Telephone Encounter (Signed)
New message ° ° ° °Patient calling for lab results °

## 2017-04-13 ENCOUNTER — Encounter (HOSPITAL_COMMUNITY)
Admission: RE | Admit: 2017-04-13 | Discharge: 2017-04-13 | Disposition: A | Discharge status: Home / Self Care | Payer: Medicare Other | Source: Ambulatory Visit | Attending: Cardiovascular Disease | Admitting: Cardiovascular Disease

## 2017-04-13 DIAGNOSIS — Z48812 Encounter for surgical aftercare following surgery on the circulatory system: Secondary | ICD-10-CM | POA: Diagnosis not present

## 2017-04-13 DIAGNOSIS — D649 Anemia, unspecified: Secondary | ICD-10-CM | POA: Diagnosis not present

## 2017-04-13 DIAGNOSIS — K219 Gastro-esophageal reflux disease without esophagitis: Secondary | ICD-10-CM | POA: Diagnosis not present

## 2017-04-13 DIAGNOSIS — Z7901 Long term (current) use of anticoagulants: Secondary | ICD-10-CM | POA: Diagnosis not present

## 2017-04-13 DIAGNOSIS — Z87891 Personal history of nicotine dependence: Secondary | ICD-10-CM | POA: Diagnosis not present

## 2017-04-13 DIAGNOSIS — E785 Hyperlipidemia, unspecified: Secondary | ICD-10-CM | POA: Insufficient documentation

## 2017-04-13 DIAGNOSIS — E119 Type 2 diabetes mellitus without complications: Secondary | ICD-10-CM | POA: Insufficient documentation

## 2017-04-13 DIAGNOSIS — Z7982 Long term (current) use of aspirin: Secondary | ICD-10-CM | POA: Insufficient documentation

## 2017-04-13 DIAGNOSIS — M797 Fibromyalgia: Secondary | ICD-10-CM | POA: Diagnosis not present

## 2017-04-13 DIAGNOSIS — G4733 Obstructive sleep apnea (adult) (pediatric): Secondary | ICD-10-CM | POA: Insufficient documentation

## 2017-04-13 DIAGNOSIS — M1990 Unspecified osteoarthritis, unspecified site: Secondary | ICD-10-CM | POA: Insufficient documentation

## 2017-04-13 DIAGNOSIS — Z955 Presence of coronary angioplasty implant and graft: Secondary | ICD-10-CM

## 2017-04-13 DIAGNOSIS — I2510 Atherosclerotic heart disease of native coronary artery without angina pectoris: Secondary | ICD-10-CM | POA: Insufficient documentation

## 2017-04-13 DIAGNOSIS — J449 Chronic obstructive pulmonary disease, unspecified: Secondary | ICD-10-CM | POA: Insufficient documentation

## 2017-04-13 DIAGNOSIS — Z79891 Long term (current) use of opiate analgesic: Secondary | ICD-10-CM | POA: Diagnosis not present

## 2017-04-13 DIAGNOSIS — Z9861 Coronary angioplasty status: Secondary | ICD-10-CM | POA: Diagnosis not present

## 2017-04-13 DIAGNOSIS — Z79899 Other long term (current) drug therapy: Secondary | ICD-10-CM | POA: Insufficient documentation

## 2017-04-13 DIAGNOSIS — F419 Anxiety disorder, unspecified: Secondary | ICD-10-CM | POA: Insufficient documentation

## 2017-04-13 DIAGNOSIS — J45909 Unspecified asthma, uncomplicated: Secondary | ICD-10-CM | POA: Diagnosis not present

## 2017-04-13 DIAGNOSIS — I10 Essential (primary) hypertension: Secondary | ICD-10-CM | POA: Insufficient documentation

## 2017-04-13 LAB — GLUCOSE, CAPILLARY: GLUCOSE-CAPILLARY: 119 mg/dL — AB (ref 65–99)

## 2017-04-13 MED ORDER — MAGNESIUM OXIDE 400 MG PO TABS: 200.0000 mg | ORAL_TABLET | Freq: Every day | ORAL | Status: DC

## 2017-04-13 NOTE — Telephone Encounter (Signed)
-----   Message from Thayer Headings, MD sent at 04/10/2017 12:20 PM EST ----- Lipid levels look good.  Basic metabolic profile looks good with the exception of the glucose which is slightly elevated.  The alkaline phosphatase is slightly elevated.  She will need to discuss this with her primary medical doctor.

## 2017-04-13 NOTE — Telephone Encounter (Signed)
Reviewed lab results with patient. She states she has not noticed increased muscle weakness over the past few days. She agrees to decrease mag oxide to 1/2 tablet (200 mg) daily. I scheduled her for repeat mag level lab appointment on 3/11. I advised her to call back with questions or concerns prior to that appointment and she thanked me for the call.

## 2017-04-15 ENCOUNTER — Encounter (HOSPITAL_COMMUNITY)
Admission: RE | Admit: 2017-04-15 | Discharge: 2017-04-15 | Disposition: A | Discharge status: Home / Self Care | Payer: Medicare Other | Source: Ambulatory Visit | Attending: Cardiovascular Disease | Admitting: Cardiovascular Disease

## 2017-04-15 DIAGNOSIS — D649 Anemia, unspecified: Secondary | ICD-10-CM | POA: Diagnosis not present

## 2017-04-15 DIAGNOSIS — F419 Anxiety disorder, unspecified: Secondary | ICD-10-CM | POA: Diagnosis not present

## 2017-04-15 DIAGNOSIS — Z9861 Coronary angioplasty status: Secondary | ICD-10-CM | POA: Diagnosis not present

## 2017-04-15 DIAGNOSIS — J45909 Unspecified asthma, uncomplicated: Secondary | ICD-10-CM | POA: Diagnosis not present

## 2017-04-15 DIAGNOSIS — Z48812 Encounter for surgical aftercare following surgery on the circulatory system: Secondary | ICD-10-CM | POA: Diagnosis not present

## 2017-04-15 DIAGNOSIS — Z955 Presence of coronary angioplasty implant and graft: Secondary | ICD-10-CM

## 2017-04-15 DIAGNOSIS — M1990 Unspecified osteoarthritis, unspecified site: Secondary | ICD-10-CM | POA: Diagnosis not present

## 2017-04-15 LAB — MAGNESIUM: MAGNESIUM: 3.3 mg/dL — AB (ref 1.6–2.3)

## 2017-04-15 LAB — GLUCOSE, CAPILLARY: Glucose-Capillary: 145 mg/dL — ABNORMAL HIGH (ref 65–99)

## 2017-04-15 LAB — SPECIMEN STATUS REPORT

## 2017-04-17 ENCOUNTER — Encounter (HOSPITAL_COMMUNITY)
Admission: RE | Admit: 2017-04-17 | Discharge: 2017-04-17 | Disposition: A | Discharge status: Home / Self Care | Payer: Medicare Other | Source: Ambulatory Visit | Attending: Cardiovascular Disease | Admitting: Cardiovascular Disease

## 2017-04-17 DIAGNOSIS — M1990 Unspecified osteoarthritis, unspecified site: Secondary | ICD-10-CM | POA: Diagnosis not present

## 2017-04-17 DIAGNOSIS — Z955 Presence of coronary angioplasty implant and graft: Secondary | ICD-10-CM

## 2017-04-17 DIAGNOSIS — D649 Anemia, unspecified: Secondary | ICD-10-CM | POA: Diagnosis not present

## 2017-04-17 DIAGNOSIS — Z9861 Coronary angioplasty status: Secondary | ICD-10-CM | POA: Diagnosis not present

## 2017-04-17 DIAGNOSIS — J45909 Unspecified asthma, uncomplicated: Secondary | ICD-10-CM | POA: Diagnosis not present

## 2017-04-17 DIAGNOSIS — Z48812 Encounter for surgical aftercare following surgery on the circulatory system: Secondary | ICD-10-CM | POA: Diagnosis not present

## 2017-04-17 DIAGNOSIS — F419 Anxiety disorder, unspecified: Secondary | ICD-10-CM | POA: Diagnosis not present

## 2017-04-20 ENCOUNTER — Other Ambulatory Visit: Payer: Medicare Other | Admitting: *Deleted

## 2017-04-20 ENCOUNTER — Encounter: Payer: Self-pay | Admitting: Internal Medicine

## 2017-04-20 ENCOUNTER — Encounter (HOSPITAL_COMMUNITY)
Admission: RE | Admit: 2017-04-20 | Discharge: 2017-04-20 | Disposition: A | Discharge status: Home / Self Care | Payer: Medicare Other | Source: Ambulatory Visit | Attending: Cardiovascular Disease | Admitting: Cardiovascular Disease

## 2017-04-20 ENCOUNTER — Encounter (INDEPENDENT_AMBULATORY_CARE_PROVIDER_SITE_OTHER): Payer: Self-pay

## 2017-04-20 DIAGNOSIS — J45909 Unspecified asthma, uncomplicated: Secondary | ICD-10-CM | POA: Diagnosis not present

## 2017-04-20 DIAGNOSIS — F419 Anxiety disorder, unspecified: Secondary | ICD-10-CM | POA: Diagnosis not present

## 2017-04-20 DIAGNOSIS — Z955 Presence of coronary angioplasty implant and graft: Secondary | ICD-10-CM

## 2017-04-20 DIAGNOSIS — Z9861 Coronary angioplasty status: Secondary | ICD-10-CM | POA: Diagnosis not present

## 2017-04-20 DIAGNOSIS — D649 Anemia, unspecified: Secondary | ICD-10-CM | POA: Diagnosis not present

## 2017-04-20 DIAGNOSIS — M1990 Unspecified osteoarthritis, unspecified site: Secondary | ICD-10-CM | POA: Diagnosis not present

## 2017-04-20 DIAGNOSIS — Z1231 Encounter for screening mammogram for malignant neoplasm of breast: Secondary | ICD-10-CM | POA: Diagnosis not present

## 2017-04-20 DIAGNOSIS — Z48812 Encounter for surgical aftercare following surgery on the circulatory system: Secondary | ICD-10-CM | POA: Diagnosis not present

## 2017-04-20 LAB — GLUCOSE, CAPILLARY: GLUCOSE-CAPILLARY: 117 mg/dL — AB (ref 65–99)

## 2017-04-21 ENCOUNTER — Other Ambulatory Visit: Payer: Self-pay

## 2017-04-21 LAB — MAGNESIUM: MAGNESIUM: 2.5 mg/dL — AB (ref 1.6–2.3)

## 2017-04-22 ENCOUNTER — Encounter (HOSPITAL_COMMUNITY)
Admission: RE | Admit: 2017-04-22 | Discharge: 2017-04-22 | Disposition: A | Discharge status: Home / Self Care | Payer: Medicare Other | Source: Ambulatory Visit | Attending: Cardiovascular Disease | Admitting: Cardiovascular Disease

## 2017-04-22 DIAGNOSIS — F419 Anxiety disorder, unspecified: Secondary | ICD-10-CM | POA: Diagnosis not present

## 2017-04-22 DIAGNOSIS — Z9861 Coronary angioplasty status: Secondary | ICD-10-CM | POA: Diagnosis not present

## 2017-04-22 DIAGNOSIS — J45909 Unspecified asthma, uncomplicated: Secondary | ICD-10-CM | POA: Diagnosis not present

## 2017-04-22 DIAGNOSIS — M1990 Unspecified osteoarthritis, unspecified site: Secondary | ICD-10-CM | POA: Diagnosis not present

## 2017-04-22 DIAGNOSIS — Z48812 Encounter for surgical aftercare following surgery on the circulatory system: Secondary | ICD-10-CM | POA: Diagnosis not present

## 2017-04-22 DIAGNOSIS — D649 Anemia, unspecified: Secondary | ICD-10-CM | POA: Diagnosis not present

## 2017-04-22 DIAGNOSIS — Z955 Presence of coronary angioplasty implant and graft: Secondary | ICD-10-CM

## 2017-04-24 ENCOUNTER — Encounter (HOSPITAL_COMMUNITY)
Admission: RE | Admit: 2017-04-24 | Discharge: 2017-04-24 | Disposition: A | Discharge status: Home / Self Care | Payer: Medicare Other | Source: Ambulatory Visit | Attending: Cardiovascular Disease | Admitting: Cardiovascular Disease

## 2017-04-24 DIAGNOSIS — F419 Anxiety disorder, unspecified: Secondary | ICD-10-CM | POA: Diagnosis not present

## 2017-04-24 DIAGNOSIS — D649 Anemia, unspecified: Secondary | ICD-10-CM | POA: Diagnosis not present

## 2017-04-24 DIAGNOSIS — J45909 Unspecified asthma, uncomplicated: Secondary | ICD-10-CM | POA: Diagnosis not present

## 2017-04-24 DIAGNOSIS — Z48812 Encounter for surgical aftercare following surgery on the circulatory system: Secondary | ICD-10-CM | POA: Diagnosis not present

## 2017-04-24 DIAGNOSIS — Z9861 Coronary angioplasty status: Secondary | ICD-10-CM | POA: Diagnosis not present

## 2017-04-24 DIAGNOSIS — Z955 Presence of coronary angioplasty implant and graft: Secondary | ICD-10-CM

## 2017-04-24 DIAGNOSIS — M1990 Unspecified osteoarthritis, unspecified site: Secondary | ICD-10-CM | POA: Diagnosis not present

## 2017-04-24 LAB — GLUCOSE, CAPILLARY
GLUCOSE-CAPILLARY: 108 mg/dL — AB (ref 65–99)
Glucose-Capillary: 129 mg/dL — ABNORMAL HIGH (ref 65–99)

## 2017-04-27 ENCOUNTER — Encounter (HOSPITAL_COMMUNITY): Payer: Medicare Other

## 2017-04-27 DIAGNOSIS — H401122 Primary open-angle glaucoma, left eye, moderate stage: Secondary | ICD-10-CM | POA: Diagnosis not present

## 2017-04-27 DIAGNOSIS — H401113 Primary open-angle glaucoma, right eye, severe stage: Secondary | ICD-10-CM | POA: Diagnosis not present

## 2017-04-27 DIAGNOSIS — H2512 Age-related nuclear cataract, left eye: Secondary | ICD-10-CM | POA: Diagnosis not present

## 2017-04-27 DIAGNOSIS — H2013 Chronic iridocyclitis, bilateral: Secondary | ICD-10-CM | POA: Diagnosis not present

## 2017-04-29 ENCOUNTER — Encounter (HOSPITAL_COMMUNITY)
Admission: RE | Admit: 2017-04-29 | Discharge: 2017-04-29 | Disposition: A | Discharge status: Home / Self Care | Payer: Medicare Other | Source: Ambulatory Visit | Attending: Cardiovascular Disease | Admitting: Cardiovascular Disease

## 2017-04-29 DIAGNOSIS — Z48812 Encounter for surgical aftercare following surgery on the circulatory system: Secondary | ICD-10-CM | POA: Diagnosis not present

## 2017-04-29 DIAGNOSIS — D649 Anemia, unspecified: Secondary | ICD-10-CM | POA: Diagnosis not present

## 2017-04-29 DIAGNOSIS — Z9861 Coronary angioplasty status: Secondary | ICD-10-CM | POA: Diagnosis not present

## 2017-04-29 DIAGNOSIS — M1990 Unspecified osteoarthritis, unspecified site: Secondary | ICD-10-CM | POA: Diagnosis not present

## 2017-04-29 DIAGNOSIS — J45909 Unspecified asthma, uncomplicated: Secondary | ICD-10-CM | POA: Diagnosis not present

## 2017-04-29 DIAGNOSIS — Z955 Presence of coronary angioplasty implant and graft: Secondary | ICD-10-CM

## 2017-04-29 DIAGNOSIS — F419 Anxiety disorder, unspecified: Secondary | ICD-10-CM | POA: Diagnosis not present

## 2017-04-30 NOTE — Progress Notes (Signed)
Cardiac Individual Treatment Plan  Patient Details  Name: Sheila Reynolds MRN: [id number] Date of Birth: [birth year] Referring Provider:     CARDIAC REHAB PHASE II ORIENTATION from 02/24/2017 in [facility]  Referring Provider  [name], Philip MD      Initial Encounter Date:    CARDIAC REHAB PHASE II ORIENTATION from 02/24/2017 in [facility]  Date  02/24/17  Referring Provider  [name], Arnette Norris MD      Visit Diagnosis: Status post coronary artery stent placement  Patient's Home Medications on Admission:  Current Outpatient Medications:  .  acetaminophen (TYLENOL) 500 MG tablet, Take 1,000 mg by mouth 2 (two) times daily as needed for moderate pain or headache., Disp: , Rfl:  .  albuterol (PROVENTIL) (2.5 MG/3ML) 0.083% nebulizer solution, Take 2.5 mg by nebulization daily as needed for wheezing or shortness of breath. , Disp: , Rfl:  .  aspirin EC 81 MG tablet, Take 81 mg by mouth at bedtime. , Disp: , Rfl:  .  atorvastatin (LIPITOR) 40 MG tablet, Take 1 tablet (40 mg total) by mouth daily., Disp: 30 tablet, Rfl: 6 .  benzonatate (TESSALON) 200 MG capsule, Take 1 capsule (200 mg total) by mouth 3 (three) times daily as needed for cough., Disp: 30 capsule, Rfl: 1 .  brimonidine (ALPHAGAN P) 0.1 % SOLN, Place 1 drop into the left eye 3 (three) times daily. , Disp: , Rfl:  .  brinzolamide (AZOPT) 1 % ophthalmic suspension, Place 1 drop into the left eye 3 (three) times daily. , Disp: , Rfl:  .  carvedilol (COREG) 6.25 MG tablet, Take 1 tablet (6.25 mg total) by mouth 2 (two) times daily., Disp: 60 tablet, Rfl: 2 .  clopidogrel (PLAVIX) 75 MG tablet, Take 1 tablet (75 mg total) by mouth daily., Disp: 30 tablet, Rfl: 11 .  furosemide (LASIX) 20 MG tablet, Take 1 tablet (20 mg total) by mouth every 3 (three) days., Disp: 30 tablet, Rfl: 5 .  guaiFENesin (ROBITUSSIN) 100 MG/5ML SOLN, Take 5 mLs by mouth every 6 (six) hours as needed  for cough or to loosen phlegm. , Disp: , Rfl:  .  isosorbide mononitrate (IMDUR) 30 MG 24 hr tablet, Take 1 tablet (30 mg total) by mouth daily., Disp: 30 tablet, Rfl: 11 .  ketorolac (ACULAR) 0.5 % ophthalmic solution, Place 1 drop into the left eye 4 (four) times daily., Disp: , Rfl:  .  loteprednol (LOTEMAX) 0.5 % ophthalmic suspension, Place 3-4 drops into both eyes See admin instructions. Instill 4 drops into the left eye once daily, and 3 drops in the right eye once daily, Disp: , Rfl:  .  Menthol, Topical Analgesic, (BIOFREEZE EX), Apply 1 application topically daily as needed (foot pain)., Disp: , Rfl:  .  montelukast (SINGULAIR) 10 MG tablet, Take 10 mg by mouth at bedtime., Disp: , Rfl:  .  nitroGLYCERIN (NITROSTAT) 0.4 MG SL tablet, Place 1 tablet (0.4 mg total) under the tongue every 5 (five) minutes as needed for chest pain., Disp: 25 tablet, Rfl: prn .  olmesartan (BENICAR) 20 MG tablet, Take 20 mg by mouth daily., Disp: , Rfl:  .  pantoprazole (PROTONIX) 40 MG tablet, Take 1 tablet (40 mg total) by mouth daily., Disp: 30 tablet, Rfl: 6 .  pregabalin (LYRICA) 75 MG capsule, Take 75 mg by mouth at bedtime., Disp: , Rfl:  .  PROAIR HFA 108 (90 Base) MCG/ACT inhaler, Inhale 2 puffs into the  lungs every 6 (six) hours as needed for wheezing., Disp: 1 Inhaler, Rfl: 12 .  sitaGLIPtin-metformin (JANUMET) 50-500 MG tablet, Take 1 tablet by mouth as directed. Take 1 tablet twice daily on Monday through Friday, take 1 tablet daily on Saturdays and Sundays, Disp: , Rfl:  .  timolol (BETIMOL) 0.5 % ophthalmic solution, Place 1 drop into the left eye 2 (two) times daily., Disp: , Rfl:  .  tolnaftate (TINACTIN) 1 % cream, Apply 1 application topically daily as needed (foot itching)., Disp: , Rfl:  .  traMADol (ULTRAM) 50 MG tablet, Take 1 tablet (50 mg total) by mouth every 6 (six) hours as needed., Disp: 40 tablet, Rfl: 0 .  travoprost, benzalkonium, (TRAVATAN) 0.004 % ophthalmic solution, Place 1  drop into both eyes at bedtime., Disp: , Rfl:   Past Medical History: Past Medical History:  Diagnosis Date  . Anemia    3 months ago anemic  . Anxiety    on meds  . Arthritis    "all over" (01/15/2017)  . Asthma   . Bronchitis with emphysema   . Chest pain   . Chronic bronchitis ([place])   . Coronary artery disease    a. 01/2017 she underwent orbital atherectomy/DES to the proxmal LAD and PTCA to ostial D2. 2D Echo 01/15/17 showed mild LVH, EF 60-65%, grade 1 DD.  . Family history of anesthesia complication    daughter N/V  . Fibromyalgia   . GERD (gastroesophageal reflux disease)    on meds  . Heart murmur   . History of hiatal hernia   . Hx of echocardiogram    Echo (03/2013):  Tech limited; Mild focal basal septal hypertrophy, EF 60-65%, normal RVF  . Hyperlipidemia   . Hypertension   . Nonsustained ventricular tachycardia ([place])   . OSA on CPAP   . Premature atrial contractions   . PVC (premature ventricular contraction)    a. Holter 12/16: NSR, occ PAC,PVCs  . Sarcoidosis   . Type II diabetes mellitus ([facility])     Tobacco Use: Social History   Tobacco Use  Smoking Status Former Smoker  . Packs/day: 1.00  . Years: 4.00  . Pack years: 4.00  . Types: Cigarettes  . Last attempt to quit: 02/11/1980  . Years since quitting: 37.2  Smokeless Tobacco Never Used    Labs: Recent Review Flowsheet Data    Labs for ITP Cardiac and Pulmonary Rehab Latest Ref Rng & Units 05/24/2014 05/07/2016 12/12/2016 12/12/2016 04/09/2017   Cholestrol 100 - 199 mg/dL 157 133 - - 129   LDLCALC 0 - 99 mg/dL 85 67 - - 68   HDL >39 mg/dL 51.70 50 - - 44   Trlycerides 0 - 149 mg/dL 104.0 80 - - 87   PHART 7.350 - 7.450 - - - 7.395 -   PCO2ART 32.0 - 48.0 mmHg - - - 40.7 -   HCO3 20.0 - 28.0 mmol/L - - 26.7 24.9 -   TCO2 22 - 32 mmol/L - - 28 26 -   O2SAT % - - 71.0 97.0 -      Capillary Blood Glucose: Lab Results  Component Value Date   GLUCAP 129 (H) 04/24/2017   GLUCAP 108 (H) 04/24/2017    GLUCAP 117 (H) 04/20/2017   GLUCAP 145 (H) 04/15/2017   GLUCAP 119 (H) 04/13/2017     Exercise Target Goals:    Exercise Program Goal: Individual exercise prescription set using results from initial 6 min walk test and THRR  while considering  patient's activity barriers and safety.   Exercise Prescription Goal: Initial exercise prescription builds to 30-45 minutes a day of aerobic activity, 2-3 days per week.  Home exercise guidelines will be given to patient during program as part of exercise prescription that the participant will acknowledge.  Activity Barriers & Risk Stratification: Activity Barriers & Cardiac Risk Stratification - 02/24/17 0957      Activity Barriers & Cardiac Risk Stratification   Activity Barriers  Arthritis;Joint Problems;Deconditioning;Muscular Weakness;Shortness of Breath;Other (comment)    Comments  R shoulder/arm pain; L shoulder limitation    Cardiac Risk Stratification  High       6 Minute Walk: 6 Minute Walk    Row Name 02/24/17 1056         6 Minute Walk   Phase  Initial     Distance  1270 feet     Walk Time  6 minutes     # of Rest Breaks  0     MPH  2.4     METS  2.8     RPE  11     VO2 Peak  9.8     Symptoms  Yes (comment)     Comments  knee pain, neck pain an d R arm pain (denied chest pain)     Resting HR  65 bpm     Resting BP  122/60     Resting Oxygen Saturation   98 %     Exercise Oxygen Saturation  during 6 min walk  98 %     Max Ex. HR  105 bpm     Max Ex. BP  144/80     2 Minute Post BP  122/84        Oxygen Initial Assessment:   Oxygen Re-Evaluation:   Oxygen Discharge (Final Oxygen Re-Evaluation):   Initial Exercise Prescription: Initial Exercise Prescription - 02/24/17 1000      Date of Initial Exercise RX and Referring Provider   Date  02/24/17    Referring Provider  [name], Philip MD      Treadmill   MPH  2    Grade  0    Minutes  10    METs  2.53      Recumbant Bike   Level  1.5    Minutes   10    METs  1.5      NuStep   Level  2    SPM  60    Minutes  10    METs  1.7      Prescription Details   Frequency (times per week)  3    Duration  Progress to 30 minutes of continuous aerobic without signs/symptoms of physical distress      Intensity   THRR 40-80% of Max Heartrate  60-129    Ratings of Perceived Exertion  11-15    Perceived Dyspnea  0-4      Progression   Progression  Continue to progress workloads to maintain intensity without signs/symptoms of physical distress.      Resistance Training   Training Prescription  Yes    Weight  1lb    Reps  10-15       Perform Capillary Blood Glucose checks as needed.  Exercise Prescription Changes: Exercise Prescription Changes    Row Name 03/02/17 1600 03/18/17 1620 04/01/17 1400 04/15/17 1605 04/29/17 0711     Response to Exercise   Blood Pressure (Admit)  122/68  124/70  120/80  122/80  150/80   Blood Pressure (Exercise)  162/82  122/80  142/78  132/70  138/80   Blood Pressure (Exit)  140/60  118/70  128/80  112/70  140/80   Heart Rate (Admit)  73 bpm  74 bpm  78 bpm  98 bpm  79 bpm   Heart Rate (Exercise)  120 bpm  113 bpm  115 bpm  115 bpm  120 bpm   Heart Rate (Exit)  65 bpm  85 bpm  80 bpm  98 bpm  88 bpm   Rating of Perceived Exertion (Exercise)  14  12  11  11  12    Perceived Dyspnea (Exercise)  0  -  -  -  0   Comments  Pt is uncomfortable on Treadmill. Will have patient walk on track moving forward.  -  -  -  Pt has left shoulder limitations, no weights used    Duration  Progress to 30 minutes of  aerobic without signs/symptoms of physical distress  Progress to 30 minutes of  aerobic without signs/symptoms of physical distress  Continue with 30 min of aerobic exercise without signs/symptoms of physical distress.  Continue with 30 min of aerobic exercise without signs/symptoms of physical distress.  Progress to 45 minutes of aerobic exercise without signs/symptoms of physical distress   Intensity  THRR New   THRR unchanged  THRR unchanged  THRR unchanged  THRR unchanged     Progression   Progression  Continue to progress workloads to maintain intensity without signs/symptoms of physical distress.  Continue to progress workloads to maintain intensity without signs/symptoms of physical distress.  Continue to progress workloads to maintain intensity without signs/symptoms of physical distress.  Continue to progress workloads to maintain intensity without signs/symptoms of physical distress.  Continue to progress workloads to maintain intensity without signs/symptoms of physical distress.   Average METs  2.1  2.1  2.67  3.07  3     Resistance Training   Training Prescription  No  No  No  No  No     Treadmill   MPH  1.7  -  -  -  -   Grade  0  -  -  -  -   Minutes  5  -  -  -  -   METs  2.3  -  -  -  -     Recumbant Bike   Level  1.5  1.5  -  -  -   Minutes  10  10  -  -  -   METs  2  2  -  -  -     NuStep   Level  2  2  4  4  5    SPM  60  80  80  80  85   Minutes  10  10  15  15  15    METs  2.2  2.1  2.6  3.4  2.1     Track   Laps  -  10  15  16  15    Minutes  -  10  10  15  15    METs  -  2.04  2.74  2.74  3.61     Home Exercise Plan   Plans to continue exercise at  -  [employer] (comment) Walking, [place] (comment) Walking, [employer] (comment) Walking, [employer] (comment) Walking, [employer]  Frequency  -  Add 2 additional days to program exercise sessions.  Add 2 additional days to program exercise sessions.  Add 2 additional days to program exercise sessions.  Add 2 additional days to program exercise sessions.   Initial Home Exercises Provided  -  03/18/17  03/18/17  03/18/17  03/18/17      Exercise Comments: Exercise Comments    Row Name 03/02/17 1651 03/18/17 1629 04/01/17 1451 04/30/17 0714 04/30/17 0728   Exercise Comments  Pt oriented to exericse equipement. Patient is off to a good  start. Will continue to monitor and progress patient.   Reviewed HEP with pt today. Pt plans to walk for exercise 2 days a week for 30 minutes. Pt also has bike at home and [employer].   Reviewed METs and Goals with pt. Will continue to monitor pt. Pt is tolerating well exercise prescription well.   Despite pt's limitations with left shoulder, pt is making good progress with exericse prescription. Pt consistently comes to rehab and is open to new information about exercising on her own. Will continue to follow pt.   Despite pt's limitations with left shoulder, pt is making good progress with exericse prescription. Pt consistently comes to rehab and is open to new information about exercising on her own. Will continue to follow pt.       Exercise Goals and Review: Exercise Goals    Row Name 02/24/17 0954 02/24/17 0959 02/24/17 1102         Exercise Goals   Increase Physical Activity  Yes  -  -     Intervention  Provide advice, education, support and counseling about physical activity/exercise needs.;Develop an individualized exercise prescription for aerobic and resistive training based on initial evaluation findings, risk stratification, comorbidities and participant's personal goals.  -  -     Expected Outcomes  Achievement of increased cardiorespiratory fitness and enhanced flexibility, muscular endurance and strength shown through measurements of functional capacity and personal statement of participant.  -  -     Increase Strength and Stamina  Yes  - be able to climb stairs without SOB, improve energy levels and desire to exercise  Yes     Intervention  Provide advice, education, support and counseling about physical activity/exercise needs.;Develop an individualized exercise prescription for aerobic and resistive training based on initial evaluation findings, risk stratification, comorbidities and participant's personal goals.  -  -     Expected Outcomes  Achievement of increased  cardiorespiratory fitness and enhanced flexibility, muscular endurance and strength shown through measurements of functional capacity and personal statement of participant.  -  -     Able to understand and use rate of perceived exertion (RPE) scale  Yes  -  -     Intervention  Provide education and explanation on how to use RPE scale  -  -     Expected Outcomes  Short Term: Able to use RPE daily in rehab to express subjective intensity level;Long Term:  Able to use RPE to guide intensity level when exercising independently  -  -     Knowledge and understanding of Target Heart Rate Range (THRR)  Yes  -  -     Intervention  Provide education and explanation of THRR including how the numbers were predicted and where they are located for reference  -  -     Expected Outcomes  Long Term: Able to use THRR to govern intensity when exercising independently;Short Term: Able to state/look  up THRR;Short Term: Able to use daily as guideline for intensity in rehab  -  -     Able to check pulse independently  Yes  -  -     Intervention  Provide education and demonstration on how to check pulse in carotid and radial arteries.;Review the importance of being able to check your own pulse for safety during independent exercise  -  -     Expected Outcomes  Short Term: Able to explain why pulse checking is important during independent exercise;Long Term: Able to check pulse independently and accurately  -  -     Understanding of Exercise Prescription  Yes  -  -     Intervention  Provide education, explanation, and written materials on patient's individual exercise prescription  -  -     Expected Outcomes  Short Term: Able to explain program exercise prescription;Long Term: Able to explain home exercise prescription to exercise independently  -  -        Exercise Goals Re-Evaluation : Exercise Goals Re-Evaluation    Row Name 04/01/17 1456 04/30/17 0720           Exercise Goal Re-Evaluation   Exercise Goals Review   Increase Physical Activity;Increase Strength and Stamina;Understanding of Exercise Prescription;Able to check pulse independently;Able to understand and use rate of perceived exertion (RPE) scale;Knowledge and understanding of Target Heart Rate Range (THRR)  Understanding of Exercise Prescription;Increase Physical Activity      Comments  Pt is tolerating exercise prescription. Pt is able to exercise for 30 minutes with minimal difficulty. Will continue to monitor and progress pt.   Pt has successfully implemented her home exercise program. Pt is going to local gym regularly in addition to cardiac rehab. Pt is now able to exercise at a level 5 on N[employer]. Will continue to motivate and progress pt as tolerated.       Expected Outcomes  Will continue to increase cardiorespiratory fitness and workloads as pt tolerated.   Pt will continue to progress workloads and increase cardiorespiratory fitness.           Discharge Exercise Prescription (Final Exercise Prescription Changes): Exercise Prescription Changes - 04/29/17 0711      Response to Exercise   Blood Pressure (Admit)  150/80    Blood Pressure (Exercise)  138/80    Blood Pressure (Exit)  140/80    Heart Rate (Admit)  79 bpm    Heart Rate (Exercise)  120 bpm    Heart Rate (Exit)  88 bpm    Rating of Perceived Exertion (Exercise)  12    Perceived Dyspnea (Exercise)  0    Comments  Pt has left shoulder limitations, no weights used     Duration  Progress to 45 minutes of aerobic exercise without signs/symptoms of physical distress    Intensity  THRR unchanged      Progression   Progression  Continue to progress workloads to maintain intensity without signs/symptoms of physical distress.    Average METs  3      Resistance Training   Training Prescription  No      NuStep   Level  5    SPM  85    Minutes  15    METs  2.1      Track   Laps  15    Minutes  15    METs  3.61      Home Exercise Plan   Plans to continue exercise at  [employer] (comment) Walking, [employer]    Frequency  Add 2 additional days to program exercise sessions.    Initial Home Exercises Provided  03/18/17       Nutrition:  Target Goals: Understanding of nutrition guidelines, daily intake of sodium 1500mg , cholesterol 200mg , calories 30% from fat and 7% or less from saturated fats, daily to have 5 or more servings of fruits and vegetables.  Biometrics: Pre Biometrics - 02/24/17 1102      Pre Biometrics   Height  5\' 2"  (1.575 m)    Weight  166 lb 14.2 oz (75.7 kg)    Waist Circumference  38.5 inches    Hip Circumference  41.5 inches    Waist to Hip Ratio  0.93 %    BMI (Calculated)  30.52    Triceps Skinfold  47 mm    % Body Fat  45 %    Grip Strength  22 kg    Flexibility  0 in    Single Leg Stand  3.21 seconds        Nutrition Therapy Plan and Nutrition Goals: Nutrition Therapy & Goals - 02/25/17 1117      Nutrition Therapy   Diet  Carb Modified, Heart Healthy      Personal Nutrition Goals   Nutrition Goal  Pt to identify food quantities necessary to achieve weight loss of 6-15 lb at graduation from cardiac rehab.      Intervention Plan   Intervention  Prescribe, educate and counsel regarding individualized specific dietary modifications aiming towards targeted core components such as weight, hypertension, lipid management, diabetes, heart failure and other comorbidities.    Expected Outcomes  Short Term Goal: Understand basic principles of dietary content, such as calories, fat, sodium, cholesterol and nutrients.;Long Term Goal: Adherence to prescribed nutrition plan.       Nutrition Assessments: Nutrition Assessments - 02/25/17 1118      MEDFICTS Scores   Pre Score  30       Nutrition Goals Re-Evaluation:   Nutrition Goals Re-Evaluation:   Nutrition Goals Discharge (Final Nutrition Goals Re-Evaluation):   Psychosocial: Target Goals: Acknowledge presence or absence of significant depression  and/or stress, maximize coping skills, provide positive support system. Participant is able to verbalize types and ability to use techniques and skills needed for reducing stress and depression.  Initial Review & Psychosocial Screening: Initial Psych Review & Screening - 02/24/17 1102      Initial Review   Current issues with  Current Anxiety/Panic health related anxiety      Family Dynamics   Good Support System?  Yes    Comments  spouse, children, friends       Barriers   Psychosocial barriers to participate in program  The patient should benefit from training in stress management and relaxation.      Screening Interventions   Interventions  Encouraged to exercise;Provide feedback about the scores to participant;To provide support and resources with identified psychosocial needs       Quality of Life Scores: Quality of Life - 02/24/17 1103      Quality of Life Scores   Health/Function Pre  18.7 %    Socioeconomic Pre  20.38 %    Psych/Spiritual Pre  23.14 %    Family Pre  22.8 %    GLOBAL Pre  20.56 %      Scores of 19 and below usually indicate a poorer quality of life in these areas.  A difference of  2-3  points is a clinically meaningful difference.  A difference of 2-3 points in the total score of the Quality of Life Index has been associated with significant improvement in overall quality of life, self-image, physical symptoms, and general health in studies assessing change in quality of life.  PHQ-9: Recent Review Flowsheet Data    Depression screen [facility] 2/9 03/02/2017 01/27/2017   Decreased Interest 0 0   Down, Depressed, Hopeless 0 1    PHQ - 2 Score 0 1     Interpretation of Total Score  Total Score Depression Severity:  1-4 = Minimal depression, 5-9 = Mild depression, 10-14 = Moderate depression, 15-19 = Moderately severe depression, 20-27 = Severe depression   Psychosocial Evaluation and Intervention:   Psychosocial Re-Evaluation: Psychosocial Re-Evaluation     Sheila Reynolds Name 04/02/17 1133 04/30/17 1309           Psychosocial Re-Evaluation   Current issues with  Current Stress Concerns  Current Stress Concerns      Comments  Sheila Reynolds has been dealing with health concerns regarding her right shoulder and needs surgery she cant  have surgery until June  Sheila Reynolds has been dealing with health concerns regarding her right shoulder and needs surgery she cant  have surgery until June      Expected Outcomes  Will offer emotional support as needed encourage the patient to partcipate in relaxation exercises.  Will offer emotional support as needed encourage the patient to partcipate in relaxation exercises.      Interventions  Stress management education;Encouraged to attend Cardiac Rehabilitation for the exercise  Stress management education;Encouraged to attend Cardiac Rehabilitation for the exercise      Continue Psychosocial Services   No Follow up required  No Follow up required        Initial Review   Source of Stress Concerns  Chronic Illness  Chronic Illness         Psychosocial Discharge (Final Psychosocial Re-Evaluation): Psychosocial Re-Evaluation - 04/30/17 1309      Psychosocial Re-Evaluation   Current issues with  Current Stress Concerns    Comments  Sheila Reynolds has been dealing with health concerns regarding her right shoulder and needs surgery she cant  have surgery until June    Expected Outcomes  Will offer emotional support as needed encourage the patient to partcipate in relaxation exercises.    Interventions  Stress management education;Encouraged to attend Cardiac Rehabilitation for the exercise    Continue Psychosocial Services   No Follow up required      Initial Review   Source of Stress Concerns  Chronic Illness       Vocational Rehabilitation: Provide vocational rehab assistance to qualifying candidates.   Vocational Rehab Evaluation & Intervention: Vocational Rehab - 02/24/17 1101      Initial Vocational Rehab  Evaluation & Intervention   Assessment shows need for Vocational Rehabilitation  No returning to work       Education: Education Goals: Education classes will be provided on a weekly basis, covering required topics. Participant will state understanding/return demonstration of topics presented.  Learning Barriers/Preferences: Learning Barriers/Preferences - 02/24/17 1056      Learning Barriers/Preferences   Learning Barriers  Sight;Hearing       Education Topics: Count Your Pulse:  -Group instruction provided by verbal instruction, demonstration, patient participation and written materials to support subject.  Instructors address importance of being able to find your pulse and how to count your pulse when at home without a heart monitor.  Patients get  hands on experience counting their pulse with staff help and individually.   CARDIAC REHAB PHASE II EXERCISE from 04/29/2017 in [place]  Date  03/06/17  Educator  RN  Instruction Review Code  2- Demonstrated Understanding      Heart Attack, Angina, and Risk Factor Modification:  -Group instruction provided by verbal instruction, video, and written materials to support subject.  Instructors address signs and symptoms of angina and heart attacks.    Also discuss risk factors for heart disease and how to make changes to improve heart health risk factors.   Functional Fitness:  -Group instruction provided by verbal instruction, demonstration, patient participation, and written materials to support subject.  Instructors address safety measures for doing things around the house.  Discuss how to get up and down off the floor, how to pick things up properly, how to safely get out of a chair without assistance, and balance training.   CARDIAC REHAB PHASE II EXERCISE from 04/29/2017 in [place]  Date  04/24/17  Instruction Review Code  2- Demonstrated Understanding       Meditation and Mindfulness:  -Group instruction provided by verbal instruction, patient participation, and written materials to support subject.  Instructor addresses importance of mindfulness and meditation practice to help reduce stress and improve awareness.  Instructor also leads participants through a meditation exercise.    CARDIAC REHAB PHASE II EXERCISE from 04/29/2017 in [place]  Date  04/29/17  Educator  Jeanella Craze  Instruction Review Code  2- Demonstrated Understanding      Stretching for Flexibility and Mobility:  -Group instruction provided by verbal instruction, patient participation, and written materials to support subject.  Instructors lead participants through series of stretches that are designed to increase flexibility thus improving mobility.  These stretches are additional exercise for major muscle groups that are typically performed during regular warm up and cool down.   Hands Only CPR:  -Group verbal, video, and participation provides a basic overview of AHA guidelines for community CPR. Role-play of emergencies allow participants the opportunity to practice calling for help and chest compression technique with discussion of AED use.   CARDIAC REHAB PHASE II EXERCISE from 04/29/2017 in [place]  Date  03/13/17  Instruction Review Code  2- Demonstrated Understanding      Hypertension: -Group verbal and written instruction that provides a basic overview of hypertension including the most recent diagnostic guidelines, risk factor reduction with self-care instructions and medication management.    Nutrition I class: Heart Healthy Eating:  -Group instruction provided by PowerPoint slides, verbal discussion, and written materials to support subject matter. The instructor gives an explanation and review of the Therapeutic Lifestyle Changes diet recommendations, which includes a discussion on lipid  goals, dietary fat, sodium, fiber, plant stanol/sterol esters, sugar, and the components of a well-balanced, healthy diet.   Nutrition II class: Lifestyle Skills:  -Group instruction provided by PowerPoint slides, verbal discussion, and written materials to support subject matter. The instructor gives an explanation and review of label reading, grocery shopping for heart health, heart healthy recipe modifications, and ways to make healthier choices when eating out.   Diabetes Question & Answer:  -Group instruction provided by PowerPoint slides, verbal discussion, and written materials to support subject matter. The instructor gives an explanation and review of diabetes co-morbidities, pre- and post-prandial blood glucose goals, pre-exercise blood glucose goals, signs, symptoms, and treatment of  hypoglycemia and hyperglycemia, and foot care basics.   Diabetes Blitz:  -Group instruction provided by PowerPoint slides, verbal discussion, and written materials to support subject matter. The instructor gives an explanation and review of the physiology behind type 1 and type 2 diabetes, diabetes medications and rational behind using different medications, pre- and post-prandial blood glucose recommendations and Hemoglobin A1c goals, diabetes diet, and exercise including blood glucose guidelines for exercising safely.    CARDIAC REHAB PHASE II EXERCISE from 04/29/2017 in [place]  Date  04/14/17  Educator  RD  Instruction Review Code  2- Demonstrated Understanding      Portion Distortion:  -Group instruction provided by PowerPoint slides, verbal discussion, written materials, and food models to support subject matter. The instructor gives an explanation of serving size versus portion size, changes in portions sizes over the last 20 years, and what consists of a serving from each food group.   CARDIAC REHAB PHASE II EXERCISE from 04/29/2017 in [place]  Date  03/11/17  Educator  RD  Instruction Review Code  2- Demonstrated Understanding      Stress Management:  -Group instruction provided by verbal instruction, video, and written materials to support subject matter.  Instructors review role of stress in heart disease and how to cope with stress positively.     CARDIAC REHAB PHASE II EXERCISE from 04/29/2017 in [place]  Date  03/18/17  Instruction Review Code  2- Demonstrated Understanding      Exercising on Your Own:  -Group instruction provided by verbal instruction, power point, and written materials to support subject.  Instructors discuss benefits of exercise, components of exercise, frequency and intensity of exercise, and end points for exercise.  Also discuss use of nitroglycerin and activating EMS.  Review options of places to exercise outside of rehab.  Review guidelines for sex with heart disease.   Cardiac Drugs I:  -Group instruction provided by verbal instruction and written materials to support subject.  Instructor reviews cardiac drug classes: antiplatelets, anticoagulants, beta blockers, and statins.  Instructor discusses reasons, side effects, and lifestyle considerations for each drug class.   Cardiac Drugs II:  -Group instruction provided by verbal instruction and written materials to support subject.  Instructor reviews cardiac drug classes: angiotensin converting enzyme inhibitors (ACE-I), angiotensin II receptor blockers (ARBs), nitrates, and calcium channel blockers.  Instructor discusses reasons, side effects, and lifestyle considerations for each drug class.   Anatomy and Physiology of the Circulatory System:  Group verbal and written instruction and models provide basic cardiac anatomy and physiology, with the coronary electrical and arterial systems. Review of: AMI, Angina, Valve disease, Heart Failure, Peripheral Artery Disease, Cardiac Arrhythmia, Pacemakers,  and the ICD.   Other Education:  -Group or individual verbal, written, or video instructions that support the educational goals of the cardiac rehab program.   CARDIAC REHAB PHASE II EXERCISE from 04/29/2017 in [place]  Date  04/22/17  Instruction Review Code  2- Demonstrated Understanding      Holiday Eating Survival Tips:  -Group instruction provided by PowerPoint slides, verbal discussion, and written materials to support subject matter. The instructor gives patients tips, tricks, and techniques to help them not only survive but enjoy the holidays despite the onslaught of food that accompanies the holidays.   Knowledge Questionnaire Score: Knowledge Questionnaire Score - 02/24/17 1101      Knowledge Questionnaire Score   Pre  Score  20/24       Core Components/Risk Factors/Patient Goals at Admission: Personal Goals and Risk Factors at Admission - 02/24/17 1103      Core Components/Risk Factors/Patient Goals on Admission    Weight Management  Yes;Obesity;Weight Maintenance;Weight Loss    Intervention  Weight Management: Develop a combined nutrition and exercise program designed to reach desired caloric intake, while maintaining appropriate intake of nutrient and fiber, sodium and fats, and appropriate energy expenditure required for the weight goal.;Weight Management: Provide education and appropriate resources to help participant work on and attain dietary goals.;Weight Management/Obesity: Establish reasonable short term and long term weight goals.;Obesity: Provide education and appropriate resources to help participant work on and attain dietary goals.    Admit Weight  166 lb 14.2 oz (75.7 kg)    Goal Weight: Short Term  160 lb (72.6 kg)    Goal Weight: Long Term  150 lb (68 kg)    Expected Outcomes  Short Term: Continue to assess and modify interventions until short term weight is achieved;Long Term: Adherence to nutrition and physical  activity/exercise program aimed toward attainment of established weight goal;Weight Maintenance: Understanding of the daily nutrition guidelines, which includes 25-35% calories from fat, 7% or less cal from saturated fats, less than 200mg  cholesterol, less than 1.5gm of sodium, & 5 or more servings of fruits and vegetables daily;Weight Loss: Understanding of general recommendations for a balanced deficit meal plan, which promotes 1-2 lb weight loss per week and includes a negative energy balance of [phone] kcal/d;Understanding recommendations for meals to include 15-35% energy as protein, 25-35% energy from fat, 35-60% energy from carbohydrates, less than 200mg  of dietary cholesterol, 20-35 gm of total fiber daily;Understanding of distribution of calorie intake throughout the day with the consumption of 4-5 meals/snacks    Improve shortness of breath with ADL's  Yes    Intervention  Provide education, individualized exercise plan and daily activity instruction to help decrease symptoms of SOB with activities of daily living.    Expected Outcomes  Short Term: Achieves a reduction of symptoms when performing activities of daily living.    Diabetes  Yes    Intervention  Provide education about signs/symptoms and action to take for hypo/hyperglycemia.;Provide education about proper nutrition, including hydration, and aerobic/resistive exercise prescription along with prescribed medications to achieve blood glucose in normal ranges: Fasting glucose 65-99 mg/dL    Expected Outcomes  Long Term: Attainment of HbA1C < 7%.;Short Term: Participant verbalizes understanding of the signs/symptoms and immediate care of hyper/hypoglycemia, proper foot care and importance of medication, aerobic/resistive exercise and nutrition plan for blood glucose control.    Hypertension  Yes    Intervention  Provide education on lifestyle modifcations including regular physical activity/exercise, weight management, moderate sodium  restriction and increased consumption of fresh fruit, vegetables, and low fat dairy, alcohol moderation, and smoking cessation.;Monitor prescription use compliance.    Expected Outcomes  Short Term: Continued assessment and intervention until BP is < 140/42mm HG in hypertensive participants. < 130/33mm HG in hypertensive participants with diabetes, heart failure or chronic kidney disease.;Long Term: Maintenance of blood pressure at goal levels.    Lipids  Yes    Intervention  Provide education and support for participant on nutrition & aerobic/resistive exercise along with prescribed medications to achieve LDL 70mg , HDL >40mg .    Expected Outcomes  Short Term: Participant states understanding of desired cholesterol values and is compliant with medications prescribed. Participant is following exercise prescription and nutrition guidelines.;Long Term: Cholesterol controlled  with medications as prescribed, with individualized exercise RX and with personalized nutrition plan. Value goals: LDL < 70mg , HDL > 40 mg.    Stress  Yes    Intervention  Offer individual and/or small group education and counseling on adjustment to heart disease, stress management and health-related lifestyle change. Teach and support self-help strategies.;Refer participants experiencing significant psychosocial distress to appropriate mental health specialists for further evaluation and treatment. When possible, include family members and significant others in education/counseling sessions.    Expected Outcomes  Short Term: Participant demonstrates changes in health-related behavior, relaxation and other stress management skills, ability to obtain effective social support, and compliance with psychotropic medications if prescribed.;Long Term: Emotional wellbeing is indicated by absence of clinically significant psychosocial distress or social isolation.       Core Components/Risk Factors/Patient Goals Review:  Goals and Risk Factor  Review    Row Name 04/02/17 1115 04/30/17 1309           Core Components/Risk Factors/Patient Goals Review   Personal Goals Review  Weight Management/Obesity;Lipids;Hypertension;Diabetes;Stress  Weight Management/Obesity;Lipids;Hypertension;Diabetes;Stress      Review  Nirel vital signs and CBG's have been stable at cardiac rehab.  Laekyn vital signs and CBG's have been stable at cardiac rehab.      Expected Outcomes  Chelby will continue to take her medicaiton for HTN Hyperlipidemia and Diabetes as presribed. Regine will continue to participate in phase 2 cardiac rehab  Taeler will continue to take her medicaiton for HTN Hyperlipidemia and Diabetes as presribed. Torii will continue to participate in phase 2 cardiac rehab         Core Components/Risk Factors/Patient Goals at Discharge (Final Review):  Goals and Risk Factor Review - 04/30/17 1309      Core Components/Risk Factors/Patient Goals Review   Personal Goals Review  Weight Management/Obesity;Lipids;Hypertension;Diabetes;Stress    Review  Soundra vital signs and CBG's have been stable at cardiac rehab.    Expected Outcomes  Krisa will continue to take her medicaiton for HTN Hyperlipidemia and Diabetes as presribed. Jia will continue to participate in phase 2 cardiac rehab       ITP Comments: ITP Comments    Row Name 02/24/17 0940 04/02/17 1114 04/30/17 1308       ITP Comments  Dr. Fransico Him, Medical Director  30 day ITP Review. Patient with good participation and attendance at phase 2 cardiac rehab.  30 day ITP Review. Patient with good participation and attendance at phase 2 cardiac rehab.        Comments: See ITP comments.Barnet Pall, RN,BSN 04/30/2017 1:13 PM

## 2017-05-01 ENCOUNTER — Encounter (HOSPITAL_COMMUNITY): Payer: Medicare Other

## 2017-05-04 ENCOUNTER — Encounter (HOSPITAL_COMMUNITY)
Admission: RE | Admit: 2017-05-04 | Discharge: 2017-05-04 | Disposition: A | Discharge status: Home / Self Care | Payer: Medicare Other | Source: Ambulatory Visit | Attending: Cardiovascular Disease | Admitting: Cardiovascular Disease

## 2017-05-04 DIAGNOSIS — J45909 Unspecified asthma, uncomplicated: Secondary | ICD-10-CM | POA: Diagnosis not present

## 2017-05-04 DIAGNOSIS — Z955 Presence of coronary angioplasty implant and graft: Secondary | ICD-10-CM

## 2017-05-04 DIAGNOSIS — M1990 Unspecified osteoarthritis, unspecified site: Secondary | ICD-10-CM | POA: Diagnosis not present

## 2017-05-04 DIAGNOSIS — Z9861 Coronary angioplasty status: Secondary | ICD-10-CM | POA: Diagnosis not present

## 2017-05-04 DIAGNOSIS — F419 Anxiety disorder, unspecified: Secondary | ICD-10-CM | POA: Diagnosis not present

## 2017-05-04 DIAGNOSIS — D649 Anemia, unspecified: Secondary | ICD-10-CM | POA: Diagnosis not present

## 2017-05-04 DIAGNOSIS — Z48812 Encounter for surgical aftercare following surgery on the circulatory system: Secondary | ICD-10-CM | POA: Diagnosis not present

## 2017-05-06 ENCOUNTER — Encounter (HOSPITAL_COMMUNITY)
Admission: RE | Admit: 2017-05-06 | Discharge: 2017-05-06 | Disposition: A | Discharge status: Home / Self Care | Payer: Medicare Other | Source: Ambulatory Visit | Attending: Cardiovascular Disease | Admitting: Cardiovascular Disease

## 2017-05-06 DIAGNOSIS — M1990 Unspecified osteoarthritis, unspecified site: Secondary | ICD-10-CM | POA: Diagnosis not present

## 2017-05-06 DIAGNOSIS — Z955 Presence of coronary angioplasty implant and graft: Secondary | ICD-10-CM

## 2017-05-06 DIAGNOSIS — J45909 Unspecified asthma, uncomplicated: Secondary | ICD-10-CM | POA: Diagnosis not present

## 2017-05-06 DIAGNOSIS — F419 Anxiety disorder, unspecified: Secondary | ICD-10-CM | POA: Diagnosis not present

## 2017-05-06 DIAGNOSIS — D649 Anemia, unspecified: Secondary | ICD-10-CM | POA: Diagnosis not present

## 2017-05-06 DIAGNOSIS — Z48812 Encounter for surgical aftercare following surgery on the circulatory system: Secondary | ICD-10-CM | POA: Diagnosis not present

## 2017-05-06 DIAGNOSIS — Z9861 Coronary angioplasty status: Secondary | ICD-10-CM | POA: Diagnosis not present

## 2017-05-08 ENCOUNTER — Encounter (HOSPITAL_COMMUNITY)
Admission: RE | Admit: 2017-05-08 | Discharge: 2017-05-08 | Disposition: A | Discharge status: Home / Self Care | Payer: Medicare Other | Source: Ambulatory Visit | Attending: Cardiovascular Disease | Admitting: Cardiovascular Disease

## 2017-05-08 DIAGNOSIS — D649 Anemia, unspecified: Secondary | ICD-10-CM | POA: Diagnosis not present

## 2017-05-08 DIAGNOSIS — J45909 Unspecified asthma, uncomplicated: Secondary | ICD-10-CM | POA: Diagnosis not present

## 2017-05-08 DIAGNOSIS — M1990 Unspecified osteoarthritis, unspecified site: Secondary | ICD-10-CM | POA: Diagnosis not present

## 2017-05-08 DIAGNOSIS — Z48812 Encounter for surgical aftercare following surgery on the circulatory system: Secondary | ICD-10-CM | POA: Diagnosis not present

## 2017-05-08 DIAGNOSIS — Z955 Presence of coronary angioplasty implant and graft: Secondary | ICD-10-CM

## 2017-05-08 DIAGNOSIS — Z9861 Coronary angioplasty status: Secondary | ICD-10-CM | POA: Diagnosis not present

## 2017-05-08 DIAGNOSIS — F419 Anxiety disorder, unspecified: Secondary | ICD-10-CM | POA: Diagnosis not present

## 2017-05-11 ENCOUNTER — Encounter (HOSPITAL_COMMUNITY)
Admission: RE | Admit: 2017-05-11 | Discharge: 2017-05-11 | Disposition: A | Discharge status: Home / Self Care | Payer: Medicare Other | Source: Ambulatory Visit | Attending: Cardiovascular Disease | Admitting: Cardiovascular Disease

## 2017-05-11 DIAGNOSIS — Z48812 Encounter for surgical aftercare following surgery on the circulatory system: Secondary | ICD-10-CM | POA: Insufficient documentation

## 2017-05-11 DIAGNOSIS — I2510 Atherosclerotic heart disease of native coronary artery without angina pectoris: Secondary | ICD-10-CM | POA: Diagnosis not present

## 2017-05-11 DIAGNOSIS — Z79891 Long term (current) use of opiate analgesic: Secondary | ICD-10-CM | POA: Insufficient documentation

## 2017-05-11 DIAGNOSIS — Z79899 Other long term (current) drug therapy: Secondary | ICD-10-CM | POA: Diagnosis not present

## 2017-05-11 DIAGNOSIS — Z87891 Personal history of nicotine dependence: Secondary | ICD-10-CM | POA: Diagnosis not present

## 2017-05-11 DIAGNOSIS — F419 Anxiety disorder, unspecified: Secondary | ICD-10-CM | POA: Diagnosis not present

## 2017-05-11 DIAGNOSIS — Z9861 Coronary angioplasty status: Secondary | ICD-10-CM | POA: Diagnosis not present

## 2017-05-11 DIAGNOSIS — D649 Anemia, unspecified: Secondary | ICD-10-CM | POA: Diagnosis not present

## 2017-05-11 DIAGNOSIS — Z7982 Long term (current) use of aspirin: Secondary | ICD-10-CM | POA: Diagnosis not present

## 2017-05-11 DIAGNOSIS — M1990 Unspecified osteoarthritis, unspecified site: Secondary | ICD-10-CM | POA: Insufficient documentation

## 2017-05-11 DIAGNOSIS — Z955 Presence of coronary angioplasty implant and graft: Secondary | ICD-10-CM

## 2017-05-11 DIAGNOSIS — E119 Type 2 diabetes mellitus without complications: Secondary | ICD-10-CM | POA: Insufficient documentation

## 2017-05-11 DIAGNOSIS — I10 Essential (primary) hypertension: Secondary | ICD-10-CM | POA: Insufficient documentation

## 2017-05-11 DIAGNOSIS — K219 Gastro-esophageal reflux disease without esophagitis: Secondary | ICD-10-CM | POA: Insufficient documentation

## 2017-05-11 DIAGNOSIS — G4733 Obstructive sleep apnea (adult) (pediatric): Secondary | ICD-10-CM | POA: Diagnosis not present

## 2017-05-11 DIAGNOSIS — J45909 Unspecified asthma, uncomplicated: Secondary | ICD-10-CM | POA: Diagnosis not present

## 2017-05-11 DIAGNOSIS — M797 Fibromyalgia: Secondary | ICD-10-CM | POA: Diagnosis not present

## 2017-05-11 DIAGNOSIS — Z7901 Long term (current) use of anticoagulants: Secondary | ICD-10-CM | POA: Diagnosis not present

## 2017-05-11 DIAGNOSIS — E785 Hyperlipidemia, unspecified: Secondary | ICD-10-CM | POA: Diagnosis not present

## 2017-05-11 DIAGNOSIS — J449 Chronic obstructive pulmonary disease, unspecified: Secondary | ICD-10-CM | POA: Insufficient documentation

## 2017-05-12 DIAGNOSIS — R748 Abnormal levels of other serum enzymes: Secondary | ICD-10-CM | POA: Diagnosis not present

## 2017-05-12 DIAGNOSIS — I2510 Atherosclerotic heart disease of native coronary artery without angina pectoris: Secondary | ICD-10-CM | POA: Diagnosis not present

## 2017-05-12 DIAGNOSIS — I119 Hypertensive heart disease without heart failure: Secondary | ICD-10-CM | POA: Diagnosis not present

## 2017-05-12 DIAGNOSIS — E119 Type 2 diabetes mellitus without complications: Secondary | ICD-10-CM | POA: Diagnosis not present

## 2017-05-12 DIAGNOSIS — E1169 Type 2 diabetes mellitus with other specified complication: Secondary | ICD-10-CM | POA: Diagnosis not present

## 2017-05-13 ENCOUNTER — Encounter (HOSPITAL_COMMUNITY)
Admission: RE | Admit: 2017-05-13 | Discharge: 2017-05-13 | Disposition: A | Discharge status: Home / Self Care | Payer: Medicare Other | Source: Ambulatory Visit | Attending: Cardiovascular Disease | Admitting: Cardiovascular Disease

## 2017-05-13 DIAGNOSIS — J45909 Unspecified asthma, uncomplicated: Secondary | ICD-10-CM | POA: Diagnosis not present

## 2017-05-13 DIAGNOSIS — M1990 Unspecified osteoarthritis, unspecified site: Secondary | ICD-10-CM | POA: Diagnosis not present

## 2017-05-13 DIAGNOSIS — Z9861 Coronary angioplasty status: Secondary | ICD-10-CM | POA: Diagnosis not present

## 2017-05-13 DIAGNOSIS — Z48812 Encounter for surgical aftercare following surgery on the circulatory system: Secondary | ICD-10-CM | POA: Diagnosis not present

## 2017-05-13 DIAGNOSIS — D649 Anemia, unspecified: Secondary | ICD-10-CM | POA: Diagnosis not present

## 2017-05-13 DIAGNOSIS — Z955 Presence of coronary angioplasty implant and graft: Secondary | ICD-10-CM

## 2017-05-13 DIAGNOSIS — F419 Anxiety disorder, unspecified: Secondary | ICD-10-CM | POA: Diagnosis not present

## 2017-05-15 ENCOUNTER — Encounter (HOSPITAL_COMMUNITY)
Admission: RE | Admit: 2017-05-15 | Discharge: 2017-05-15 | Disposition: A | Discharge status: Home / Self Care | Payer: Medicare Other | Source: Ambulatory Visit | Attending: Cardiovascular Disease | Admitting: Cardiovascular Disease

## 2017-05-15 DIAGNOSIS — Z48812 Encounter for surgical aftercare following surgery on the circulatory system: Secondary | ICD-10-CM | POA: Diagnosis not present

## 2017-05-15 DIAGNOSIS — H25812 Combined forms of age-related cataract, left eye: Secondary | ICD-10-CM | POA: Diagnosis not present

## 2017-05-15 DIAGNOSIS — Z955 Presence of coronary angioplasty implant and graft: Secondary | ICD-10-CM

## 2017-05-15 DIAGNOSIS — Z9861 Coronary angioplasty status: Secondary | ICD-10-CM | POA: Diagnosis not present

## 2017-05-15 DIAGNOSIS — F419 Anxiety disorder, unspecified: Secondary | ICD-10-CM | POA: Diagnosis not present

## 2017-05-15 DIAGNOSIS — J45909 Unspecified asthma, uncomplicated: Secondary | ICD-10-CM | POA: Diagnosis not present

## 2017-05-15 DIAGNOSIS — M1990 Unspecified osteoarthritis, unspecified site: Secondary | ICD-10-CM | POA: Diagnosis not present

## 2017-05-15 DIAGNOSIS — D649 Anemia, unspecified: Secondary | ICD-10-CM | POA: Diagnosis not present

## 2017-05-18 ENCOUNTER — Encounter (HOSPITAL_COMMUNITY): Payer: Medicare Other

## 2017-05-20 ENCOUNTER — Encounter (HOSPITAL_COMMUNITY)
Admission: RE | Admit: 2017-05-20 | Discharge: 2017-05-20 | Disposition: A | Discharge status: Home / Self Care | Payer: Medicare Other | Source: Ambulatory Visit | Attending: Cardiovascular Disease | Admitting: Cardiovascular Disease

## 2017-05-20 DIAGNOSIS — Z955 Presence of coronary angioplasty implant and graft: Secondary | ICD-10-CM

## 2017-05-20 DIAGNOSIS — M1990 Unspecified osteoarthritis, unspecified site: Secondary | ICD-10-CM | POA: Diagnosis not present

## 2017-05-20 DIAGNOSIS — Z9861 Coronary angioplasty status: Secondary | ICD-10-CM | POA: Diagnosis not present

## 2017-05-20 DIAGNOSIS — D649 Anemia, unspecified: Secondary | ICD-10-CM | POA: Diagnosis not present

## 2017-05-20 DIAGNOSIS — Z48812 Encounter for surgical aftercare following surgery on the circulatory system: Secondary | ICD-10-CM | POA: Diagnosis not present

## 2017-05-20 DIAGNOSIS — F419 Anxiety disorder, unspecified: Secondary | ICD-10-CM | POA: Diagnosis not present

## 2017-05-20 DIAGNOSIS — J45909 Unspecified asthma, uncomplicated: Secondary | ICD-10-CM | POA: Diagnosis not present

## 2017-05-22 ENCOUNTER — Other Ambulatory Visit: Payer: Self-pay | Admitting: Cardiovascular Disease

## 2017-05-22 ENCOUNTER — Encounter (HOSPITAL_COMMUNITY)
Admission: RE | Admit: 2017-05-22 | Discharge: 2017-05-22 | Disposition: A | Discharge status: Home / Self Care | Payer: Medicare Other | Source: Ambulatory Visit | Attending: Cardiovascular Disease | Admitting: Cardiovascular Disease

## 2017-05-22 DIAGNOSIS — J45909 Unspecified asthma, uncomplicated: Secondary | ICD-10-CM | POA: Diagnosis not present

## 2017-05-22 DIAGNOSIS — J019 Acute sinusitis, unspecified: Secondary | ICD-10-CM | POA: Diagnosis not present

## 2017-05-22 DIAGNOSIS — F419 Anxiety disorder, unspecified: Secondary | ICD-10-CM | POA: Diagnosis not present

## 2017-05-22 DIAGNOSIS — M1990 Unspecified osteoarthritis, unspecified site: Secondary | ICD-10-CM | POA: Diagnosis not present

## 2017-05-22 DIAGNOSIS — Z48812 Encounter for surgical aftercare following surgery on the circulatory system: Secondary | ICD-10-CM | POA: Diagnosis not present

## 2017-05-22 DIAGNOSIS — D649 Anemia, unspecified: Secondary | ICD-10-CM | POA: Diagnosis not present

## 2017-05-22 DIAGNOSIS — Z955 Presence of coronary angioplasty implant and graft: Secondary | ICD-10-CM

## 2017-05-22 DIAGNOSIS — Z9861 Coronary angioplasty status: Secondary | ICD-10-CM | POA: Diagnosis not present

## 2017-05-25 ENCOUNTER — Encounter (HOSPITAL_COMMUNITY)
Admission: RE | Admit: 2017-05-25 | Discharge: 2017-05-25 | Disposition: A | Discharge status: Home / Self Care | Payer: Medicare Other | Source: Ambulatory Visit | Attending: Cardiovascular Disease | Admitting: Cardiovascular Disease

## 2017-05-25 VITALS — Ht 62.0 in | Wt 168.2 lb

## 2017-05-25 DIAGNOSIS — F419 Anxiety disorder, unspecified: Secondary | ICD-10-CM | POA: Diagnosis not present

## 2017-05-25 DIAGNOSIS — Z48812 Encounter for surgical aftercare following surgery on the circulatory system: Secondary | ICD-10-CM | POA: Diagnosis not present

## 2017-05-25 DIAGNOSIS — M1990 Unspecified osteoarthritis, unspecified site: Secondary | ICD-10-CM | POA: Diagnosis not present

## 2017-05-25 DIAGNOSIS — J45909 Unspecified asthma, uncomplicated: Secondary | ICD-10-CM | POA: Diagnosis not present

## 2017-05-25 DIAGNOSIS — D649 Anemia, unspecified: Secondary | ICD-10-CM | POA: Diagnosis not present

## 2017-05-25 DIAGNOSIS — Z9861 Coronary angioplasty status: Secondary | ICD-10-CM | POA: Diagnosis not present

## 2017-05-25 DIAGNOSIS — Z955 Presence of coronary angioplasty implant and graft: Secondary | ICD-10-CM

## 2017-05-27 ENCOUNTER — Encounter (HOSPITAL_COMMUNITY)
Admission: RE | Admit: 2017-05-27 | Discharge: 2017-05-27 | Disposition: A | Discharge status: Home / Self Care | Payer: Medicare Other | Source: Ambulatory Visit | Attending: Cardiovascular Disease | Admitting: Cardiovascular Disease

## 2017-05-27 DIAGNOSIS — Z48812 Encounter for surgical aftercare following surgery on the circulatory system: Secondary | ICD-10-CM | POA: Diagnosis not present

## 2017-05-27 DIAGNOSIS — J45909 Unspecified asthma, uncomplicated: Secondary | ICD-10-CM | POA: Diagnosis not present

## 2017-05-27 DIAGNOSIS — F419 Anxiety disorder, unspecified: Secondary | ICD-10-CM | POA: Diagnosis not present

## 2017-05-27 DIAGNOSIS — Z9861 Coronary angioplasty status: Secondary | ICD-10-CM | POA: Diagnosis not present

## 2017-05-27 DIAGNOSIS — D649 Anemia, unspecified: Secondary | ICD-10-CM | POA: Diagnosis not present

## 2017-05-27 DIAGNOSIS — Z955 Presence of coronary angioplasty implant and graft: Secondary | ICD-10-CM

## 2017-05-27 DIAGNOSIS — M1990 Unspecified osteoarthritis, unspecified site: Secondary | ICD-10-CM | POA: Diagnosis not present

## 2017-05-28 ENCOUNTER — Encounter (HOSPITAL_COMMUNITY): Payer: Self-pay

## 2017-05-28 NOTE — Progress Notes (Signed)
Cardiac Individual Treatment Plan  Patient Details  Name: Sheila Reynolds MRN: [id number] Date of Birth: 10/08/1945 Referring Provider:     CARDIAC REHAB PHASE II ORIENTATION from 02/24/2017 in [facility]  Referring Provider  [name], Philip MD      Initial Encounter Date:    CARDIAC REHAB PHASE II ORIENTATION from 02/24/2017 in [facility]  Date  02/24/17  Referring Provider  [name], Arnette Norris MD      Visit Diagnosis: Status post coronary artery stent placement  Patient's Home Medications on Admission:  Current Outpatient Medications:  .  acetaminophen (TYLENOL) 500 MG tablet, Take 1,000 mg by mouth 2 (two) times daily as needed for moderate pain or headache., Disp: , Rfl:  .  albuterol (PROVENTIL) (2.5 MG/3ML) 0.083% nebulizer solution, Take 2.5 mg by nebulization daily as needed for wheezing or shortness of breath. , Disp: , Rfl:  .  aspirin EC 81 MG tablet, Take 81 mg by mouth at bedtime. , Disp: , Rfl:  .  atorvastatin (LIPITOR) 40 MG tablet, Take 1 tablet (40 mg total) by mouth daily., Disp: 30 tablet, Rfl: 6 .  benzonatate (TESSALON) 200 MG capsule, Take 1 capsule (200 mg total) by mouth 3 (three) times daily as needed for cough., Disp: 30 capsule, Rfl: 1 .  brimonidine (ALPHAGAN P) 0.1 % SOLN, Place 1 drop into the left eye 3 (three) times daily. , Disp: , Rfl:  .  brinzolamide (AZOPT) 1 % ophthalmic suspension, Place 1 drop into the left eye 3 (three) times daily. , Disp: , Rfl:  .  carvedilol (COREG) 6.25 MG tablet, Take 1 tablet (6.25 mg total) by mouth 2 (two) times daily., Disp: 60 tablet, Rfl: 2 .  clopidogrel (PLAVIX) 75 MG tablet, Take 1 tablet (75 mg total) by mouth daily., Disp: 30 tablet, Rfl: 11 .  furosemide (LASIX) 20 MG tablet, TAKE 1 TABLET BY MOUTH EVERY 3 DAYS, Disp: 45 tablet, Rfl: 3 .  guaiFENesin (ROBITUSSIN) 100 MG/5ML SOLN, Take 5 mLs by mouth every 6 (six) hours as needed for cough or to loosen  phlegm. , Disp: , Rfl:  .  isosorbide mononitrate (IMDUR) 30 MG 24 hr tablet, Take 1 tablet (30 mg total) by mouth daily., Disp: 30 tablet, Rfl: 11 .  ketorolac (ACULAR) 0.5 % ophthalmic solution, Place 1 drop into the left eye 4 (four) times daily., Disp: , Rfl:  .  loteprednol (LOTEMAX) 0.5 % ophthalmic suspension, Place 3-4 drops into both eyes See admin instructions. Instill 4 drops into the left eye once daily, and 3 drops in the right eye once daily, Disp: , Rfl:  .  Menthol, Topical Analgesic, (BIOFREEZE EX), Apply 1 application topically daily as needed (foot pain)., Disp: , Rfl:  .  montelukast (SINGULAIR) 10 MG tablet, Take 10 mg by mouth at bedtime., Disp: , Rfl:  .  nitroGLYCERIN (NITROSTAT) 0.4 MG SL tablet, Place 1 tablet (0.4 mg total) under the tongue every 5 (five) minutes as needed for chest pain., Disp: 25 tablet, Rfl: prn .  olmesartan (BENICAR) 20 MG tablet, Take 20 mg by mouth daily., Disp: , Rfl:  .  pantoprazole (PROTONIX) 40 MG tablet, Take 1 tablet (40 mg total) by mouth daily., Disp: 30 tablet, Rfl: 6 .  pregabalin (LYRICA) 75 MG capsule, Take 75 mg by mouth at bedtime., Disp: , Rfl:  .  PROAIR HFA 108 (90 Base) MCG/ACT inhaler, Inhale 2 puffs into the lungs every 6 (six)  hours as needed for wheezing., Disp: 1 Inhaler, Rfl: 12 .  sitaGLIPtin-metformin (JANUMET) 50-500 MG tablet, Take 1 tablet by mouth as directed. Take 1 tablet twice daily on Monday through Friday, take 1 tablet daily on Saturdays and Sundays, Disp: , Rfl:  .  timolol (BETIMOL) 0.5 % ophthalmic solution, Place 1 drop into the left eye 2 (two) times daily., Disp: , Rfl:  .  tolnaftate (TINACTIN) 1 % cream, Apply 1 application topically daily as needed (foot itching)., Disp: , Rfl:  .  traMADol (ULTRAM) 50 MG tablet, Take 1 tablet (50 mg total) by mouth every 6 (six) hours as needed., Disp: 40 tablet, Rfl: 0 .  travoprost, benzalkonium, (TRAVATAN) 0.004 % ophthalmic solution, Place 1 drop into both eyes at  bedtime., Disp: , Rfl:   Past Medical History: Past Medical History:  Diagnosis Date  . Anemia    3 months ago anemic  . Anxiety    on meds  . Arthritis    "all over" (01/15/2017)  . Asthma   . Bronchitis with emphysema   . Chest pain   . Chronic bronchitis ([place])   . Coronary artery disease    a. 01/2017 she underwent orbital atherectomy/DES to the proxmal LAD and PTCA to ostial D2. 2D Echo 01/15/17 showed mild LVH, EF 60-65%, grade 1 DD.  . Family history of anesthesia complication    daughter N/V  . Fibromyalgia   . GERD (gastroesophageal reflux disease)    on meds  . Heart murmur   . History of hiatal hernia   . Hx of echocardiogram    Echo (03/2013):  Tech limited; Mild focal basal septal hypertrophy, EF 60-65%, normal RVF  . Hyperlipidemia   . Hypertension   . Nonsustained ventricular tachycardia ([place])   . OSA on CPAP   . Premature atrial contractions   . PVC (premature ventricular contraction)    a. Holter 12/16: NSR, occ PAC,PVCs  . Sarcoidosis   . Type II diabetes mellitus ([facility])     Tobacco Use: Social History   Tobacco Use  Smoking Status Former Smoker  . Packs/day: 1.00  . Years: 4.00  . Pack years: 4.00  . Types: Cigarettes  . Last attempt to quit: 02/11/1980  . Years since quitting: 37.3  Smokeless Tobacco Never Used    Labs: Recent Review Flowsheet Data    Labs for ITP Cardiac and Pulmonary Rehab Latest Ref Rng & Units 05/24/2014 05/07/2016 12/12/2016 12/12/2016 04/09/2017   Cholestrol 100 - 199 mg/dL 157 133 - - 129   LDLCALC 0 - 99 mg/dL 85 67 - - 68   HDL >39 mg/dL 51.70 50 - - 44   Trlycerides 0 - 149 mg/dL 104.0 80 - - 87   PHART 7.350 - 7.450 - - - 7.395 -   PCO2ART 32.0 - 48.0 mmHg - - - 40.7 -   HCO3 20.0 - 28.0 mmol/L - - 26.7 24.9 -   TCO2 22 - 32 mmol/L - - 28 26 -   O2SAT % - - 71.0 97.0 -      Capillary Blood Glucose: Lab Results  Component Value Date   GLUCAP 129 (H) 04/24/2017   GLUCAP 108 (H) 04/24/2017   GLUCAP 117 (H)  04/20/2017   GLUCAP 145 (H) 04/15/2017   GLUCAP 119 (H) 04/13/2017     Exercise Target Goals:    Exercise Program Goal: Individual exercise prescription set using results from initial 6 min walk test and THRR while considering  patient's  activity barriers and safety.   Exercise Prescription Goal: Initial exercise prescription builds to 30-45 minutes a day of aerobic activity, 2-3 days per week.  Home exercise guidelines will be given to patient during program as part of exercise prescription that the participant will acknowledge.  Activity Barriers & Risk Stratification: Activity Barriers & Cardiac Risk Stratification - 02/24/17 0957      Activity Barriers & Cardiac Risk Stratification   Activity Barriers  Arthritis;Joint Problems;Deconditioning;Muscular Weakness;Shortness of Breath;Other (comment)    Comments  R shoulder/arm pain; L shoulder limitation    Cardiac Risk Stratification  High       6 Minute Walk: 6 Minute Walk    Row Name 02/24/17 1056 05/25/17 1512       6 Minute Walk   Phase  Initial  Discharge    Distance  1270 feet  1665 feet    Distance % Change  -  31.1 %    Distance Feet Change  -  395 ft    Walk Time  6 minutes  6 minutes    # of Rest Breaks  0  0    MPH  2.4  3.15    METS  2.8  3.68    RPE  11  11    Perceived Dyspnea   -  0    VO2 Peak  9.8  12.89    Symptoms  Yes (comment)  No    Comments  knee pain, neck pain an d R arm pain (denied chest pain)  -    Resting HR  65 bpm  85 bpm    Resting BP  122/60  124/70    Resting Oxygen Saturation   98 %  -    Exercise Oxygen Saturation  during 6 min walk  98 %  -    Max Ex. HR  105 bpm  124 bpm    Max Ex. BP  144/80  142/80    2 Minute Post BP  122/84  124/80       Oxygen Initial Assessment:   Oxygen Re-Evaluation:   Oxygen Discharge (Final Oxygen Re-Evaluation):   Initial Exercise Prescription: Initial Exercise Prescription - 02/24/17 1000      Date of Initial Exercise RX and Referring  Provider   Date  02/24/17    Referring Provider  [name], Philip MD      Treadmill   MPH  2    Grade  0    Minutes  10    METs  2.53      Recumbant Bike   Level  1.5    Minutes  10    METs  1.5      NuStep   Level  2    SPM  60    Minutes  10    METs  1.7      Prescription Details   Frequency (times per week)  3    Duration  Progress to 30 minutes of continuous aerobic without signs/symptoms of physical distress      Intensity   THRR 40-80% of Max Heartrate  60-129    Ratings of Perceived Exertion  11-15    Perceived Dyspnea  0-4      Progression   Progression  Continue to progress workloads to maintain intensity without signs/symptoms of physical distress.      Resistance Training   Training Prescription  Yes    Weight  1lb    Reps  10-15  Perform Capillary Blood Glucose checks as needed.  Exercise Prescription Changes: Exercise Prescription Changes    Row Name 03/02/17 1600 03/18/17 1620 04/01/17 1400 04/15/17 1605 04/29/17 0711     Response to Exercise   Blood Pressure (Admit)  122/68  124/70  120/80  122/80  150/80   Blood Pressure (Exercise)  162/82  122/80  142/78  132/70  138/80   Blood Pressure (Exit)  140/60  118/70  128/80  112/70  140/80   Heart Rate (Admit)  73 bpm  74 bpm  78 bpm  98 bpm  79 bpm   Heart Rate (Exercise)  120 bpm  113 bpm  115 bpm  115 bpm  120 bpm   Heart Rate (Exit)  65 bpm  85 bpm  80 bpm  98 bpm  88 bpm   Rating of Perceived Exertion (Exercise)  14  12  11  11  12    Perceived Dyspnea (Exercise)  0  -  -  -  0   Comments  Pt is uncomfortable on Treadmill. Will have patient walk on track moving forward.  -  -  -  Pt has left shoulder limitations, no weights used    Duration  Progress to 30 minutes of  aerobic without signs/symptoms of physical distress  Progress to 30 minutes of  aerobic without signs/symptoms of physical distress  Continue with 30 min of aerobic exercise without signs/symptoms of physical distress.  Continue  with 30 min of aerobic exercise without signs/symptoms of physical distress.  Progress to 45 minutes of aerobic exercise without signs/symptoms of physical distress   Intensity  THRR New  THRR unchanged  THRR unchanged  THRR unchanged  THRR unchanged     Progression   Progression  Continue to progress workloads to maintain intensity without signs/symptoms of physical distress.  Continue to progress workloads to maintain intensity without signs/symptoms of physical distress.  Continue to progress workloads to maintain intensity without signs/symptoms of physical distress.  Continue to progress workloads to maintain intensity without signs/symptoms of physical distress.  Continue to progress workloads to maintain intensity without signs/symptoms of physical distress.   Average METs  2.1  2.1  2.67  3.07  3     Resistance Training   Training Prescription  No  No  No  No  No     Treadmill   MPH  1.7  -  -  -  -   Grade  0  -  -  -  -   Minutes  5  -  -  -  -   METs  2.3  -  -  -  -     Recumbant Bike   Level  1.5  1.5  -  -  -   Minutes  10  10  -  -  -   METs  2  2  -  -  -     NuStep   Level  2  2  4  4  5    SPM  60  80  80  80  85   Minutes  10  10  15  15  15    METs  2.2  2.1  2.6  3.4  2.1     Track   Laps  -  10  15  16  15    Minutes  -  10  10  15  15    METs  -  2.04  2.74  2.74  3.61     Home Exercise Plan   Plans to continue exercise at  [facility] (comment) Walking, [place] (comment) Walking, [employer] (comment) Walking, [employer] (comment) Walking, [place] 2 additional days to program exercise sessions.  Add 2 additional days to program exercise sessions.  Add 2 additional days to program exercise sessions.  Add 2 additional days to program exercise sessions.   Initial Home Exercises Provided  -  03/18/17  03/18/17  03/18/17  03/18/17   Row Name 05/13/17 0933  05/27/17 0713           Response to Exercise   Blood Pressure (Admit)  124/70  134/80      Blood Pressure (Exercise)  144/78  154/74      Blood Pressure (Exit)  118/62  130/70      Heart Rate (Admit)  91 bpm  83 bpm      Heart Rate (Exercise)  132 bpm  127 bpm      Heart Rate (Exit)  95 bpm  93 bpm      Rating of Perceived Exertion (Exercise)  11  12      Perceived Dyspnea (Exercise)  0  0      Duration  Progress to 45 minutes of aerobic exercise without signs/symptoms of physical distress  Continue with 45 min of aerobic exercise without signs/symptoms of physical distress.      Intensity  THRR unchanged  THRR unchanged        Progression   Progression  Continue to progress workloads to maintain intensity without signs/symptoms of physical distress.  Continue to progress workloads to maintain intensity without signs/symptoms of physical distress.      Average METs  3.37  3.16        Resistance Training   Training Prescription  No  Yes Uses 3lb weight in right shoulder, no weight in left shoulde      Weight  -  3lbs      Reps  -  10-15      Time  -  10 Minutes        Recumbant Bike   Level  -  3      Minutes  -  10      METs  -  3.68        NuStep   Level  5  5      SPM  85  85      Minutes  15  10      METs  3.3  2.9        Track   Laps  13  11      Minutes  15  10      METs  3.43  2.91        Home Exercise Plan   Plans to continue exercise at  [employer] (comment) Walking, [place] (comment) Walking, [employer]      Frequency  Add 2 additional days to program exercise sessions.  Add 2 additional days to program exercise sessions.      Initial Home Exercises Provided  04/07/17  04/07/17         Exercise Comments: Exercise Comments    Row Name 03/02/17 1651 03/18/17 1629 04/01/17 1451 04/30/17 0714 04/30/17 0728   Exercise Comments  Pt oriented to exericse equipement. Patient  is off to a good start. Will continue to monitor  and progress patient.   Reviewed HEP with pt today. Pt plans to walk for exercise 2 days a week for 30 minutes. Pt also has bike at home and [employer].   Reviewed METs and Goals with pt. Will continue to monitor pt. Pt is tolerating well exercise prescription well.   Despite pt's limitations with left shoulder, pt is making good progress with exericse prescription. Pt consistently comes to rehab and is open to new information about exercising on her own. Will continue to follow pt.   Despite pt's limitations with left shoulder, pt is making good progress with exericse prescription. Pt consistently comes to rehab and is open to new information about exercising on her own. Will continue to follow pt.    [place] Name 05/28/17 [phone]           Exercise Comments  Pt is still responding well to exercise prescription. Despite limitations with left shoulder, pt still puts forth great effort with exercise. Pt has successfully started exercising on her own. Will continue to monitor and progress pt as tolerated.           Exercise Goals and Review: Exercise Goals    Row Name 02/24/17 0954 02/24/17 0959 02/24/17 1102         Exercise Goals   Increase Physical Activity  Yes  -  -     Intervention  Provide advice, education, support and counseling about physical activity/exercise needs.;Develop an individualized exercise prescription for aerobic and resistive training based on initial evaluation findings, risk stratification, comorbidities and participant's personal goals.  -  -     Expected Outcomes  Achievement of increased cardiorespiratory fitness and enhanced flexibility, muscular endurance and strength shown through measurements of functional capacity and personal statement of participant.  -  -     Increase Strength and Stamina  Yes  - be able to climb stairs without SOB, improve energy levels and desire to exercise  Yes     Intervention  Provide advice, education, support and counseling  about physical activity/exercise needs.;Develop an individualized exercise prescription for aerobic and resistive training based on initial evaluation findings, risk stratification, comorbidities and participant's personal goals.  -  -     Expected Outcomes  Achievement of increased cardiorespiratory fitness and enhanced flexibility, muscular endurance and strength shown through measurements of functional capacity and personal statement of participant.  -  -     Able to understand and use rate of perceived exertion (RPE) scale  Yes  -  -     Intervention  Provide education and explanation on how to use RPE scale  -  -     Expected Outcomes  Short Term: Able to use RPE daily in rehab to express subjective intensity level;Long Term:  Able to use RPE to guide intensity level when exercising independently  -  -     Knowledge and understanding of Target Heart Rate Range (THRR)  Yes  -  -     Intervention  Provide education and explanation of THRR including how the numbers were predicted and where they are located for reference  -  -     Expected Outcomes  Long Term: Able to use THRR to govern intensity when exercising independently;Short Term: Able to state/look up THRR;Short Term: Able to use daily as guideline for intensity in rehab  -  -     Able to check pulse independently  Yes  -  -  Intervention  Provide education and demonstration on how to check pulse in carotid and radial arteries.;Review the importance of being able to check your own pulse for safety during independent exercise  -  -     Expected Outcomes  Short Term: Able to explain why pulse checking is important during independent exercise;Long Term: Able to check pulse independently and accurately  -  -     Understanding of Exercise Prescription  Yes  -  -     Intervention  Provide education, explanation, and written materials on patient's individual exercise prescription  -  -     Expected Outcomes  Short Term: Able to explain program  exercise prescription;Long Term: Able to explain home exercise prescription to exercise independently  -  -        Exercise Goals Re-Evaluation : Exercise Goals Re-Evaluation    Row Name 04/01/17 1456 04/30/17 0720 05/28/17 0717         Exercise Goal Re-Evaluation   Exercise Goals Review  Increase Physical Activity;Increase Strength and Stamina;Understanding of Exercise Prescription;Able to check pulse independently;Able to understand and use rate of perceived exertion (RPE) scale;Knowledge and understanding of Target Heart Rate Range (THRR)  Understanding of Exercise Prescription;Increase Physical Activity  Understanding of Exercise Prescription;Increase Physical Activity     Comments  Pt is tolerating exercise prescription. Pt is able to exercise for 30 minutes with minimal difficulty. Will continue to monitor and progress pt.   Pt has successfully implemented her home exercise program. Pt is going to local gym regularly in addition to cardiac rehab. Pt is now able to exercise at a level 5 on Nustep. Will continue to motivate and progress pt as tolerated.   One of pt's goals was to be able to climb stairs with minimal SOB. Pt states her stamina has improved and is able to climb stairs with more ease now. Pt also wanted to increase motiviation to exericse and that has improved greatly with her joining a local gym.      Expected Outcomes  Will continue to increase cardiorespiratory fitness and workloads as pt tolerated.   Pt will continue to progress workloads and increase cardiorespiratory fitness.   Pt will continue to exercise 2 additional days a week at local gym for 30 minutes. Pt will also work to increase funtional capacity. Will continue to monitor and progress pt.          Discharge Exercise Prescription (Final Exercise Prescription Changes): Exercise Prescription Changes - 05/27/17 0713      Response to Exercise   Blood Pressure (Admit)  134/80    Blood Pressure (Exercise)  154/74      Blood Pressure (Exit)  130/70    Heart Rate (Admit)  83 bpm    Heart Rate (Exercise)  127 bpm    Heart Rate (Exit)  93 bpm    Rating of Perceived Exertion (Exercise)  12    Perceived Dyspnea (Exercise)  0    Duration  Continue with 45 min of aerobic exercise without signs/symptoms of physical distress.    Intensity  THRR unchanged      Progression   Progression  Continue to progress workloads to maintain intensity without signs/symptoms of physical distress.    Average METs  3.16      Resistance Training   Training Prescription  Yes Uses 3lb weight in right shoulder, no weight in left shoulde    Weight  3lbs    Reps  10-15    Time  10 Minutes      Recumbant Bike   Level  3    Minutes  10    METs  3.68      NuStep   Level  5    SPM  85    Minutes  10    METs  2.9      Track   Laps  11    Minutes  10    METs  2.91      Home Exercise Plan   Plans to continue exercise at  [employer] (comment) [facility]    Frequency  Add 2 additional days to program exercise sessions.    Initial Home Exercises Provided  04/07/17       Nutrition:  Target Goals: Understanding of nutrition guidelines, daily intake of sodium 1500mg , cholesterol 200mg , calories 30% from fat and 7% or less from saturated fats, daily to have 5 or more servings of fruits and vegetables.  Biometrics: Pre Biometrics - 05/25/17 1508      Pre Biometrics   Height  5\' 2"  (1.575 m)    Weight  168 lb 3.4 oz (76.3 kg)    Waist Circumference  37.5 inches    Hip Circumference  41 inches    Waist to Hip Ratio  0.91 %    BMI (Calculated)  30.76    Triceps Skinfold  41 mm    % Body Fat  44 %    Grip Strength  24 kg    Flexibility  11 in    Single Leg Stand  9.12 seconds        Nutrition Therapy Plan and Nutrition Goals: Nutrition Therapy & Goals - 02/25/17 1117      Nutrition Therapy   Diet  Carb Modified, Heart Healthy      Personal Nutrition Goals   Nutrition Goal  Pt to  identify food quantities necessary to achieve weight loss of 6-15 lb at graduation from cardiac rehab.      Intervention Plan   Intervention  Prescribe, educate and counsel regarding individualized specific dietary modifications aiming towards targeted core components such as weight, hypertension, lipid management, diabetes, heart failure and other comorbidities.    Expected Outcomes  Short Term Goal: Understand basic principles of dietary content, such as calories, fat, sodium, cholesterol and nutrients.;Long Term Goal: Adherence to prescribed nutrition plan.       Nutrition Assessments: Nutrition Assessments - 02/25/17 1118      MEDFICTS Scores   Pre Score  30       Nutrition Goals Re-Evaluation:   Nutrition Goals Re-Evaluation:   Nutrition Goals Discharge (Final Nutrition Goals Re-Evaluation):   Psychosocial: Target Goals: Acknowledge presence or absence of significant depression and/or stress, maximize coping skills, provide positive support system. Participant is able to verbalize types and ability to use techniques and skills needed for reducing stress and depression.  Initial Review & Psychosocial Screening: Initial Psych Review & Screening - 02/24/17 1102      Initial Review   Current issues with  Current Anxiety/Panic health related anxiety      Family Dynamics   Good Support System?  Yes    Comments  spouse, children, friends       Barriers   Psychosocial barriers to participate in program  The patient should benefit from training in stress management and relaxation.      Screening Interventions   Interventions  Encouraged to exercise;Provide feedback about the scores to participant;To provide support and resources  with identified psychosocial needs       Quality of Life Scores: Quality of Life - 02/24/17 1103      Quality of Life Scores   Health/Function Pre  18.7 %    Socioeconomic Pre  20.38 %    Psych/Spiritual Pre  23.14 %    Family Pre  22.8 %     GLOBAL Pre  20.56 %      Scores of 19 and below usually indicate a poorer quality of life in these areas.  A difference of  2-3 points is a clinically meaningful difference.  A difference of 2-3 points in the total score of the Quality of Life Index has been associated with significant improvement in overall quality of life, self-image, physical symptoms, and general health in studies assessing change in quality of life.  PHQ-9: Recent Review Flowsheet Data    Depression screen [facility] 2/9 03/02/2017 01/27/2017   Decreased Interest 0 0   Down, Depressed, Hopeless 0 1    PHQ - 2 Score 0 1     Interpretation of Total Score  Total Score Depression Severity:  1-4 = Minimal depression, 5-9 = Mild depression, 10-14 = Moderate depression, 15-19 = Moderately severe depression, 20-27 = Severe depression   Psychosocial Evaluation and Intervention:   Psychosocial Re-Evaluation: Psychosocial Re-Evaluation    [place] Name 04/02/17 1133 04/30/17 1309 05/28/17 1444         Psychosocial Re-Evaluation   Current issues with  Current Stress Concerns  Current Stress Concerns  Current Stress Concerns     Comments  Sheila Reynolds has been dealing with health concerns regarding her right shoulder and needs surgery she cant  have surgery until June  Sheila Reynolds has been dealing with health concerns regarding her right shoulder and needs surgery she cant  have surgery until June  Sheila Reynolds has been dealing with health concerns regarding her right shoulder and needs surgery she cant  have surgery until June as well as cataract surgery that was recently rescheduled until the end of May.      Expected Outcomes  Will offer emotional support as needed encourage the patient to partcipate in relaxation exercises.  Will offer emotional support as needed encourage the patient to partcipate in relaxation exercises.  Will offer emotional support as needed and encourage the patient to partcipate in relaxation exercises.     Interventions   Stress management education;Encouraged to attend Cardiac Rehabilitation for the exercise  Stress management education;Encouraged to attend Cardiac Rehabilitation for the exercise  Stress management education;Encouraged to attend Cardiac Rehabilitation for the exercise;Relaxation education     Continue Psychosocial Services   No Follow up required  No Follow up required  No Follow up required       Initial Review   Source of Stress Concerns  Chronic Illness  Chronic Illness  -        Psychosocial Discharge (Final Psychosocial Re-Evaluation): Psychosocial Re-Evaluation - 05/28/17 1444      Psychosocial Re-Evaluation   Current issues with  Current Stress Concerns    Comments  Sheila Reynolds has been dealing with health concerns regarding her right shoulder and needs surgery she cant  have surgery until June as well as cataract surgery that was recently rescheduled until the end of May.     Expected Outcomes  Will offer emotional support as needed and encourage the patient to partcipate in relaxation exercises.    Interventions  Stress management education;Encouraged to attend Cardiac Rehabilitation for the exercise;Relaxation education  Continue Psychosocial Services   No Follow up required       Vocational Rehabilitation: Provide vocational rehab assistance to qualifying candidates.   Vocational Rehab Evaluation & Intervention: Vocational Rehab - 02/24/17 1101      Initial Vocational Rehab Evaluation & Intervention   Assessment shows need for Vocational Rehabilitation  No returning to work       Education: Education Goals: Education classes will be provided on a weekly basis, covering required topics. Participant will state understanding/return demonstration of topics presented.  Learning Barriers/Preferences: Learning Barriers/Preferences - 02/24/17 1056      Learning Barriers/Preferences   Learning Barriers  Sight;Hearing       Education Topics: Count Your Pulse:  -Group  instruction provided by verbal instruction, demonstration, patient participation and written materials to support subject.  Instructors address importance of being able to find your pulse and how to count your pulse when at home without a heart monitor.  Patients get hands on experience counting their pulse with staff help and individually.   CARDIAC REHAB PHASE II EXERCISE from 05/27/2017 in [place]  Date  03/06/17  Educator  RN  Instruction Review Code  2- Demonstrated Understanding      Heart Attack, Angina, and Risk Factor Modification:  -Group instruction provided by verbal instruction, video, and written materials to support subject.  Instructors address signs and symptoms of angina and heart attacks.    Also discuss risk factors for heart disease and how to make changes to improve heart health risk factors.   Functional Fitness:  -Group instruction provided by verbal instruction, demonstration, patient participation, and written materials to support subject.  Instructors address safety measures for doing things around the house.  Discuss how to get up and down off the floor, how to pick things up properly, how to safely get out of a chair without assistance, and balance training.   CARDIAC REHAB PHASE II EXERCISE from 05/27/2017 in [place]  Date  04/24/17  Instruction Review Code  2- Demonstrated Understanding      Meditation and Mindfulness:  -Group instruction provided by verbal instruction, patient participation, and written materials to support subject.  Instructor addresses importance of mindfulness and meditation practice to help reduce stress and improve awareness.  Instructor also leads participants through a meditation exercise.    CARDIAC REHAB PHASE II EXERCISE from 05/27/2017 in [place]  Date  04/29/17  Educator  Jeanella Craze  Instruction Review Code  2- Demonstrated  Understanding      Stretching for Flexibility and Mobility:  -Group instruction provided by verbal instruction, patient participation, and written materials to support subject.  Instructors lead participants through series of stretches that are designed to increase flexibility thus improving mobility.  These stretches are additional exercise for major muscle groups that are typically performed during regular warm up and cool down.   Hands Only CPR:  -Group verbal, video, and participation provides a basic overview of AHA guidelines for community CPR. Role-play of emergencies allow participants the opportunity to practice calling for help and chest compression technique with discussion of AED use.   CARDIAC REHAB PHASE II EXERCISE from 05/27/2017 in [place]  Date  03/13/17  Instruction Review Code  2- Demonstrated Understanding      Hypertension: -Group verbal and written instruction that provides a basic overview of hypertension including the most recent diagnostic guidelines, risk factor reduction with self-care  instructions and medication management.    Nutrition I class: Heart Healthy Eating:  -Group instruction provided by PowerPoint slides, verbal discussion, and written materials to support subject matter. The instructor gives an explanation and review of the Therapeutic Lifestyle Changes diet recommendations, which includes a discussion on lipid goals, dietary fat, sodium, fiber, plant stanol/sterol esters, sugar, and the components of a well-balanced, healthy diet.   Nutrition II class: Lifestyle Skills:  -Group instruction provided by PowerPoint slides, verbal discussion, and written materials to support subject matter. The instructor gives an explanation and review of label reading, grocery shopping for heart health, heart healthy recipe modifications, and ways to make healthier choices when eating out.   Diabetes Question & Answer:  -Group  instruction provided by PowerPoint slides, verbal discussion, and written materials to support subject matter. The instructor gives an explanation and review of diabetes co-morbidities, pre- and post-prandial blood glucose goals, pre-exercise blood glucose goals, signs, symptoms, and treatment of hypoglycemia and hyperglycemia, and foot care basics.   CARDIAC REHAB PHASE II EXERCISE from 05/27/2017 in [place]  Date  05/15/17  Educator  RD  Instruction Review Code  2- Demonstrated Understanding      Diabetes Blitz:  -Group instruction provided by PowerPoint slides, verbal discussion, and written materials to support subject matter. The instructor gives an explanation and review of the physiology behind type 1 and type 2 diabetes, diabetes medications and rational behind using different medications, pre- and post-prandial blood glucose recommendations and Hemoglobin A1c goals, diabetes diet, and exercise including blood glucose guidelines for exercising safely.    CARDIAC REHAB PHASE II EXERCISE from 05/27/2017 in [place]  Date  04/14/17  Educator  RD  Instruction Review Code  2- Demonstrated Understanding      Portion Distortion:  -Group instruction provided by PowerPoint slides, verbal discussion, written materials, and food models to support subject matter. The instructor gives an explanation of serving size versus portion size, changes in portions sizes over the last 20 years, and what consists of a serving from each food group.   CARDIAC REHAB PHASE II EXERCISE from 05/27/2017 in [place]  Date  03/11/17  Educator  RD  Instruction Review Code  2- Demonstrated Understanding      Stress Management:  -Group instruction provided by verbal instruction, video, and written materials to support subject matter.  Instructors review role of stress in heart disease and how to cope with stress  positively.     CARDIAC REHAB PHASE II EXERCISE from 05/27/2017 in [place]  Date  05/13/17  Instruction Review Code  2- Demonstrated Understanding      Exercising on Your Own:  -Group instruction provided by verbal instruction, power point, and written materials to support subject.  Instructors discuss benefits of exercise, components of exercise, frequency and intensity of exercise, and end points for exercise.  Also discuss use of nitroglycerin and activating EMS.  Review options of places to exercise outside of rehab.  Review guidelines for sex with heart disease.   Cardiac Drugs I:  -Group instruction provided by verbal instruction and written materials to support subject.  Instructor reviews cardiac drug classes: antiplatelets, anticoagulants, beta blockers, and statins.  Instructor discusses reasons, side effects, and lifestyle considerations for each drug class.   Cardiac Drugs II:  -Group instruction provided by verbal instruction and written materials to support subject.  Instructor reviews cardiac drug classes: angiotensin  converting enzyme inhibitors (ACE-I), angiotensin II receptor blockers (ARBs), nitrates, and calcium channel blockers.  Instructor discusses reasons, side effects, and lifestyle considerations for each drug class.   Anatomy and Physiology of the Circulatory System:  Group verbal and written instruction and models provide basic cardiac anatomy and physiology, with the coronary electrical and arterial systems. Review of: AMI, Angina, Valve disease, Heart Failure, Peripheral Artery Disease, Cardiac Arrhythmia, Pacemakers, and the ICD.   CARDIAC REHAB PHASE II EXERCISE from 05/27/2017 in [place]  Date  05/27/17  Educator  Barnet Pall  Instruction Review Code  2- Demonstrated Understanding      Other Education:  -Group or individual verbal, written, or video instructions that support the  educational goals of the cardiac rehab program.   CARDIAC REHAB PHASE II EXERCISE from 05/27/2017 in [place]  Date  04/22/17  Instruction Review Code  2- Demonstrated Understanding      Holiday Eating Survival Tips:  -Group instruction provided by PowerPoint slides, verbal discussion, and written materials to support subject matter. The instructor gives patients tips, tricks, and techniques to help them not only survive but enjoy the holidays despite the onslaught of food that accompanies the holidays.   Knowledge Questionnaire Score: Knowledge Questionnaire Score - 02/24/17 1101      Knowledge Questionnaire Score   Pre Score  20/24       Core Components/Risk Factors/Patient Goals at Admission: Personal Goals and Risk Factors at Admission - 02/24/17 1103      Core Components/Risk Factors/Patient Goals on Admission    Weight Management  Yes;Obesity;Weight Maintenance;Weight Loss    Intervention  Weight Management: Develop a combined nutrition and exercise program designed to reach desired caloric intake, while maintaining appropriate intake of nutrient and fiber, sodium and fats, and appropriate energy expenditure required for the weight goal.;Weight Management: Provide education and appropriate resources to help participant work on and attain dietary goals.;Weight Management/Obesity: Establish reasonable short term and long term weight goals.;Obesity: Provide education and appropriate resources to help participant work on and attain dietary goals.    Admit Weight  166 lb 14.2 oz (75.7 kg)    Goal Weight: Short Term  160 lb (72.6 kg)    Goal Weight: Long Term  150 lb (68 kg)    Expected Outcomes  Short Term: Continue to assess and modify interventions until short term weight is achieved;Long Term: Adherence to nutrition and physical activity/exercise program aimed toward attainment of established weight goal;Weight Maintenance: Understanding of the daily  nutrition guidelines, which includes 25-35% calories from fat, 7% or less cal from saturated fats, less than 200mg  cholesterol, less than 1.5gm of sodium, & 5 or more servings of fruits and vegetables daily;Weight Loss: Understanding of general recommendations for a balanced deficit meal plan, which promotes 1-2 lb weight loss per week and includes a negative energy balance of [id number] kcal/d;Understanding recommendations for meals to include 15-35% energy as protein, 25-35% energy from fat, 35-60% energy from carbohydrates, less than 200mg  of dietary cholesterol, 20-35 gm of total fiber daily;Understanding of distribution of calorie intake throughout the day with the consumption of 4-5 meals/snacks    Improve shortness of breath with ADL's  Yes    Intervention  Provide education, individualized exercise plan and daily activity instruction to help decrease symptoms of SOB with activities of daily living.    Expected Outcomes  Short Term: Achieves a reduction of symptoms when performing activities of daily living.  Diabetes  Yes    Intervention  Provide education about signs/symptoms and action to take for hypo/hyperglycemia.;Provide education about proper nutrition, including hydration, and aerobic/resistive exercise prescription along with prescribed medications to achieve blood glucose in normal ranges: Fasting glucose 65-99 mg/dL    Expected Outcomes  Long Term: Attainment of HbA1C < 7%.;Short Term: Participant verbalizes understanding of the signs/symptoms and immediate care of hyper/hypoglycemia, proper foot care and importance of medication, aerobic/resistive exercise and nutrition plan for blood glucose control.    Hypertension  Yes    Intervention  Provide education on lifestyle modifcations including regular physical activity/exercise, weight management, moderate sodium restriction and increased consumption of fresh fruit, vegetables, and low fat dairy, alcohol moderation, and smoking  cessation.;Monitor prescription use compliance.    Expected Outcomes  Short Term: Continued assessment and intervention until BP is < 140/77mm HG in hypertensive participants. < 130/53mm HG in hypertensive participants with diabetes, heart failure or chronic kidney disease.;Long Term: Maintenance of blood pressure at goal levels.    Lipids  Yes    Intervention  Provide education and support for participant on nutrition & aerobic/resistive exercise along with prescribed medications to achieve LDL 70mg , HDL >40mg .    Expected Outcomes  Short Term: Participant states understanding of desired cholesterol values and is compliant with medications prescribed. Participant is following exercise prescription and nutrition guidelines.;Long Term: Cholesterol controlled with medications as prescribed, with individualized exercise RX and with personalized nutrition plan. Value goals: LDL < 70mg , HDL > 40 mg.    Stress  Yes    Intervention  Offer individual and/or small group education and counseling on adjustment to heart disease, stress management and health-related lifestyle change. Teach and support self-help strategies.;Refer participants experiencing significant psychosocial distress to appropriate mental health specialists for further evaluation and treatment. When possible, include family members and significant others in education/counseling sessions.    Expected Outcomes  Short Term: Participant demonstrates changes in health-related behavior, relaxation and other stress management skills, ability to obtain effective social support, and compliance with psychotropic medications if prescribed.;Long Term: Emotional wellbeing is indicated by absence of clinically significant psychosocial distress or social isolation.       Core Components/Risk Factors/Patient Goals Review:  Goals and Risk Factor Review    Row Name 04/02/17 1115 04/30/17 1309 05/28/17 0810         Core Components/Risk Factors/Patient Goals  Review   Personal Goals Review  Weight Management/Obesity;Lipids;Hypertension;Diabetes;Stress  Weight Management/Obesity;Lipids;Hypertension;Diabetes;Stress  Weight Management/Obesity;Lipids;Hypertension;Diabetes;Stress     Review  Sheila Reynolds vital signs and CBG's have been stable at cardiac rehab.  Sheila Reynolds vital signs and CBG's have been stable at cardiac rehab.  Pt with multiple CAD RF participates willingly in CR Program.  Sheila Reynolds reports an increase in energy that she is grateful for.      Expected Outcomes  Sheila Reynolds will continue to take her medicaiton for HTN Hyperlipidemia and Diabetes as presribed. Sheila Reynolds will continue to participate in phase 2 cardiac rehab  Sheila Reynolds will continue to take her medicaiton for HTN Hyperlipidemia and Diabetes as presribed. Sheila Reynolds will continue to participate in phase 2 cardiac rehab  Pt will continue to participate in CR exercise and RF modifcation opportunities.         Core Components/Risk Factors/Patient Goals at Discharge (Final Review):  Goals and Risk Factor Review - 05/28/17 0810      Core Components/Risk Factors/Patient Goals Review   Personal Goals Review  Weight Management/Obesity;Lipids;Hypertension;Diabetes;Stress    Review  Pt with multiple CAD RF participates willingly  in [place] reports an increase in energy that she is grateful for.     Expected Outcomes  Pt will continue to participate in CR exercise and RF modifcation opportunities.        ITP Comments: ITP Comments    Row Name 02/24/17 0940 04/02/17 1114 04/30/17 1308 05/28/17 0809     ITP Comments  Dr. Fransico Him, Medical Director  30 day ITP Review. Patient with good participation and attendance at phase 2 cardiac rehab.  30 day ITP Review. Patient with good participation and attendance at phase 2 cardiac rehab.  30 Day ITP Review. Pt with continued willingness to participate in CR Program.        Comments: See ITP Comments.

## 2017-05-28 NOTE — Progress Notes (Signed)
Office Visit Note  Patient: Sheila Reynolds             Date of Birth: [birth year]           MRN: [id number]             PCP: Glendale Chard, MD Referring: Glendale Chard, MD Visit Date: 06/01/2017 Occupation: @GUAROCC @    Subjective:  Other (increased pain)   History of Present Illness: Sheila Reynolds is a 72 y.o. female with history of sarcoidosis and uveitis.  She returns today after her last visit in September 2016.  She was initially methotrexate and did extremely well on methotrexate which she discontinued in August 2014.  She has had recurrent uveitis and iritis.  Patient reports that she moved to [place] and has returned back to [place] now.  She has been seeing an ophthalmologist Dr. Ander Slade at [facility] in [place].  She states she is has had recent spells of uveitis and iritis.  She is also seeing a retinal specialist.  She has been diagnosed with glaucoma and will need surgery for that.  She was followed by Dr. Annamaria Boots for her pulmonary disease and has not seen them in several months.  Experiencing increased pain all over.  She states for her right shoulder joint pain she was recently evaluated by orthopedic surgeon.  Dr. French Ana did MRI of her shoulder and her C-spine L told her that she had rotator cuff tear in her right shoulder and disc disease in her C-spine. she complains of pain in her bilateral knee joints as well.  To have generalized pain from fibromyalgia syndrome.  She states recently her Lyrica dose was increased by Dr. Baird Cancer which was helpful.  Activities of Daily Living:  Patient reports morning stiffness for 30 minutes.   Patient Reports nocturnal pain.  Difficulty dressing/grooming: Denies Difficulty climbing stairs: Reports Difficulty getting out of chair: Reports Difficulty using hands for taps, buttons, cutlery, and/or writing: Denies   Review of Systems  Constitutional: Positive for fatigue. Negative for night sweats, weight gain and  weight loss.  HENT: Positive for mouth dryness. Negative for mouth sores, trouble swallowing, trouble swallowing and nose dryness.   Eyes: Negative for pain, redness, visual disturbance and dryness.  Respiratory: Negative for cough, shortness of breath and difficulty breathing.   Cardiovascular: Positive for hypertension. Negative for chest pain, palpitations, irregular heartbeat and swelling in legs/feet.  Gastrointestinal: Negative for blood in stool, constipation and diarrhea.  Endocrine: Negative for increased urination.  Genitourinary: Negative for vaginal dryness.  Musculoskeletal: Positive for myalgias, morning stiffness and myalgias. Negative for arthralgias, joint pain, joint swelling, muscle weakness and muscle tenderness.  Skin: Negative for color change, rash, hair loss, skin tightness, ulcers and sensitivity to sunlight.  Allergic/Immunologic: Negative for susceptible to infections.  Neurological: Negative for dizziness, memory loss, night sweats and weakness.  Hematological: Negative for swollen glands.  Psychiatric/Behavioral: Positive for depressed mood. Negative for sleep disturbance. The patient is nervous/anxious.     PMFS History:  Patient Active Problem List   Diagnosis Date Noted  . 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 Fibromyalgia 06/01/2017  . DDD (degenerative disc disease), lumbar 06/01/2017  . 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 History of hypercholesterolemia 06/01/2017  . History of anxiety 06/01/2017  . History of IBS 06/01/2017  . Premature atrial contractions 03/30/2017  . PVC's (premature ventricular contractions) 03/30/2017  . Diabetes mellitus without complication ([place]) 02/12/7251  . Angina pectoris ([place]) 01/15/2017  . CAD (coronary  artery disease) 01/12/2017  . Shortness of breath 12/12/2016  . Acute maxillary sinusitis 05/03/2014  . Obstructive sleep apnea 11/04/2013  . Multiple lung nodules 07/18/2012  . Heart palpitations 03/11/2012  .  HTN (hypertension) 03/11/2012  . GERD (gastroesophageal reflux disease) 07/13/2011  . Sarcoidosis 02/08/2011  . Chronic bronchitis ([place]) 02/05/2011  . Hyperlipemia 09/24/2010  . Atypical chest pain 09/24/2010  . NSVT (nonsustained ventricular tachycardia) ([place]) 09/24/2010  . Leg edema 09/24/2010    Past Medical History:  Diagnosis Date  . Anemia    3 months ago anemic  . Anxiety    on meds  . Arthritis    "all over" (01/15/2017)  . Asthma   . Bronchitis with emphysema   . Chest pain   . Chronic bronchitis ([place])   . Coronary artery disease    a. 01/2017 she underwent orbital atherectomy/DES to the proxmal LAD and PTCA to ostial D2. 2D Echo 01/15/17 showed mild LVH, EF 60-65%, grade 1 DD.  . Family history of anesthesia complication    daughter N/V  . Fibromyalgia   . GERD (gastroesophageal reflux disease)    on meds  . Heart murmur   . History of hiatal hernia   . Hx of echocardiogram    Echo (03/2013):  Tech limited; Mild focal basal septal hypertrophy, EF 60-65%, normal RVF  . Hyperlipidemia   . Hypertension   . Nonsustained ventricular tachycardia ([place])   . OSA on CPAP   . Premature atrial contractions   . PVC (premature ventricular contraction)    a. Holter 12/16: NSR, occ PAC,PVCs  . Sarcoidosis   . Type II diabetes mellitus ([facility])     Family History  Problem Relation Age of Onset  . Heart disease Father   . Diabetes Father   . Glaucoma Father   . Cancer Mother        unknown type, ?lung  . Heart disease Paternal Grandmother   . Cancer Paternal Grandmother        unknown type  . Diabetes Brother   . Glaucoma Brother    Past Surgical History:  Procedure Laterality Date  . BREAST SURGERY    . CARDIAC CATHETERIZATION  05/04/2007   reveals overall normal left ventricular systolic function. Ejection fraction 65-70%  . CATARACT EXTRACTION W/PHACO Right 07/20/2013   Procedure: CATARACT EXTRACTION PHACO AND INTRAOCULAR LENS PLACEMENT (IOC);  Surgeon: Marylynn Pearson,  MD;  Location: [facility];  Service: Ophthalmology;  Laterality: Right;  . COLONOSCOPY W/ BIOPSIES AND POLYPECTOMY    . COLONOSCOPY WITH PROPOFOL N/A 09/07/2014   Procedure: COLONOSCOPY WITH PROPOFOL;  Surgeon: Juanita Craver, MD;  Location: [facility];  Service: Endoscopy;  Laterality: N/A;  . CORONARY ANGIOPLASTY WITH STENT PLACEMENT  01/15/2017  . CORONARY ATHERECTOMY N/A 01/15/2017   Procedure: CORONARY ATHERECTOMY;  Surgeon: Nelva Bush, MD;  Location: [facility];  Service: Cardiovascular;  Laterality: N/A;  . CORONARY BALLOON ANGIOPLASTY N/A 01/15/2017   Procedure: CORONARY BALLOON ANGIOPLASTY;  Surgeon: Nelva Bush, MD;  Location: [facility];  Service: Cardiovascular;  Laterality: N/A;  . CORONARY STENT INTERVENTION N/A 01/15/2017   Procedure: CORONARY STENT INTERVENTION;  Surgeon: Nelva Bush, MD;  Location: [facility];  Service: Cardiovascular;  Laterality: N/A;  . ESOPHAGOGASTRODUODENOSCOPY (EGD) WITH PROPOFOL N/A 09/07/2014   Procedure: ESOPHAGOGASTRODUODENOSCOPY (EGD) WITH PROPOFOL;  Surgeon: Juanita Craver, MD;  Location: [facility];  Service: Endoscopy;  Laterality: N/A;  . EXTERNAL EAR SURGERY Bilateral 1970s   tumors removed  .  EYE SURGERY    . INTRAVASCULAR PRESSURE WIRE/FFR STUDY N/A 12/12/2016   Procedure: INTRAVASCULAR PRESSURE WIRE/FFR STUDY;  Surgeon: Nelva Bush, MD;  Location: [place] [facility];  Service: Cardiovascular;  Laterality: N/A;  . MINI SHUNT INSERTION Right 07/20/2013   Procedure: INSERTION OF GLAUCOMA FILTRATION DEVICE RIGHT EYE;  Surgeon: Marylynn Pearson, MD;  Location: [facility];  Service: Ophthalmology;  Laterality: Right;  . MITOMYCIN C APPLICATION Right 08/18/6281   Procedure: MITOMYCIN C APPLICATION;  Surgeon: Marylynn Pearson, MD;  Location: [place] [place];  Service: Ophthalmology;  Laterality: Right;  . MITOMYCIN C APPLICATION Right 6/62/9476   Procedure: MITOMYCIN C APPLICATION RIGHT EYE;  Surgeon: Marylynn Pearson, MD;  Location: [facility];   Service: Ophthalmology;  Laterality: Right;  . PLACEMENT OF BREAST IMPLANTS Bilateral 1992   "took all my breast tissue out; put implants in;fibrocystic breast disease "  . RIGHT/LEFT HEART CATH AND CORONARY ANGIOGRAPHY N/A 12/12/2016   Procedure: RIGHT/LEFT HEART CATH AND CORONARY ANGIOGRAPHY;  Surgeon: Nelva Bush, MD;  Location: [facility];  Service: Cardiovascular;  Laterality: N/A;  . TONSILLECTOMY    . TOTAL ABDOMINAL HYSTERECTOMY  1982  . TRABECULECTOMY Right 02/21/2015   Procedure: TRABECULECTOMY WITH [facility] ON THE RIGHT EYE;  Surgeon: Marylynn Pearson, MD;  Location: [facility];  Service: Ophthalmology;  Laterality: Right;   Social History   Social History Narrative  . Not on file     Objective: Vital Signs: BP (!) 156/75 (BP Location: Left Arm, Patient Position: Sitting, Cuff Size: Normal)   Pulse 76   Resp 15   Ht 5\' 2"  (1.575 m)   Wt 169 lb (76.7 kg)   BMI 30.91 kg/m    Physical Exam  Constitutional: She is oriented to person, place, and time. She appears well-developed and well-nourished.  HENT:  Head: Normocephalic and atraumatic.  Eyes: Conjunctivae and EOM are normal.  Neck: Normal range of motion.  Cardiovascular: Normal rate, regular rhythm, normal heart sounds and intact distal pulses.  Pulmonary/Chest: Effort normal and breath sounds normal.  Abdominal: Soft. Bowel sounds are normal.  Lymphadenopathy:    She has no cervical adenopathy.  Neurological: She is alert and oriented to person, place, and time.  Skin: Skin is warm and dry. Capillary refill takes less than 2 seconds.  Psychiatric: She has a normal mood and affect. Her behavior is normal.  Nursing note and vitals reviewed.    Musculoskeletal Exam: C-spine thoracic lumbar spine are not limited range of motion.  She had painful range of motion of her right shoulder.  She has good range of motion of her left shoulder, bilateral elbows wrist joint MCPs and PIPs.  Hip joints, knee joints, knees ankle  joints, MTPs and PIPs were in good range of motion with no synovitis.  She she has generalized hyperalgesia and positive tender points on examination.  CDAI Exam: No CDAI exam completed.    Investigation: No additional findings. CBC Latest Ref Rng & Units 03/23/2017 01/16/2017 01/15/2017  WBC 4.0 - 10.5 K/uL 7.2 6.3 6.0  Hemoglobin 12.0 - 15.0 g/dL 11.8(L) 11.2(L) 10.3(L)  Hematocrit 36.0 - 46.0 % 35.0(L) 33.6(L) 31.7(L)  Platelets 150 - 400 K/uL 196 163 153   CMP Latest Ref Rng & Units 04/09/2017 03/31/2017 03/23/2017  Glucose 65 - 99 mg/dL 119(H) 119(H) 124(H)  BUN 8 - 27 mg/dL 12 11 17   Creatinine 0.57 - 1.00 mg/dL 0.81 0.71 0.84  Sodium 134 - 144 mmol/L 138 143 136  Potassium 3.5 - 5.2 mmol/L  4.3 4.6 3.8  Chloride 96 - 106 mmol/L 101 104 102  CO2 20 - 29 mmol/L 24 24 26   Calcium 8.7 - 10.3 mg/dL 9.2 9.2 9.0  Total Protein 6.0 - 8.5 g/dL 6.4 6.7 -  Total Bilirubin 0.0 - 1.2 mg/dL 0.3 0.3 -  Alkaline Phos 39 - 117 IU/L 130(H) 119(H) -  AST 0 - 40 IU/L 16 16 -  ALT 0 - 32 IU/L 13 15 -    Imaging: No results found.  Speciality Comments: No specialty comments available.    Procedures:  No procedures performed Allergies: Crestor [rosuvastatin calcium]; Shellfish allergy; and Demerol [meperidine]   Assessment / Plan:     Visit Diagnoses: Sarcoidosis -patient was diagnosed with sarcoidosis several years ago and was treated with methotrexate.  She was doing really well on methotrexate which she discontinued 2014.  She has been followed by Dr. Annamaria Boots for pulmonary disease and according to Dr. Janee Morn notes her disease is in remission.  She denies any joint swelling.  I did not see any synovitis on examination.  I have advised her to return in case she develops any swelling in any of her joints.  I will contact her after lab results.  She currently has sinus infection I would obtain following labs in about a month.  Plan: Sedimentation rate, Angiotensin converting enzyme  Uveitis-she  has history of uveitis and iritis for which she has been followed by ophthalmologist.  I do not have any of those records.  I am uncertain if she needs methotrexate for eye disease at this point.  She states she is awaiting cataract surgery.  Chronic right shoulder pain-she was recently diagnosed with rotator cuff tear by Dr. French Ana per patient.  She states she had MRI of her shoulder joint.  DDD cervical-patient states she had recent MRI done by Dr. Veda Canning and was diagnosed with disc disease of cervical spine.  She was sent for physical therapy.  DDD (degenerative disc disease), lumbar-she continues to have chronic pain  DDD (degenerative disc disease), thoracic- Chronic pain  History of diabetes mellitus  History of hypertension  History of gastroesophageal reflux (GERD)  History of IBS  History of hypercholesterolemia  Fibromyalgia-she is been experiencing a lot of discomfort from fibromyalgia.  She is on medications including Lyrica and hydrocodone.  History of anxiety  Coronary artery disease  - Status post a stent placement    Orders: Orders Placed This Encounter  Procedures  . Sedimentation rate  . Angiotensin converting enzyme   No orders of the defined types were placed in this encounter.   Face-to-face time spent with patient was 40 minutes.  Greater than 50% of time was spent in counseling and coordination of care.  Follow-Up Instructions: Return in about 6 months (around 12/01/2017) for sarcoidosis, uveitis, DDD, FMS.   Bo Merino, MD  Note - This record has been created using Editor, commissioning.  Chart creation errors have been sought, but may not always  have been located. Such creation errors do not reflect on  the standard of medical care.

## 2017-05-29 ENCOUNTER — Encounter (HOSPITAL_COMMUNITY)
Admission: RE | Admit: 2017-05-29 | Discharge: 2017-05-29 | Disposition: A | Discharge status: Home / Self Care | Payer: Medicare Other | Source: Ambulatory Visit | Attending: Cardiovascular Disease | Admitting: Cardiovascular Disease

## 2017-05-29 DIAGNOSIS — Z48812 Encounter for surgical aftercare following surgery on the circulatory system: Secondary | ICD-10-CM | POA: Diagnosis not present

## 2017-05-29 DIAGNOSIS — Z9861 Coronary angioplasty status: Secondary | ICD-10-CM | POA: Diagnosis not present

## 2017-05-29 DIAGNOSIS — D649 Anemia, unspecified: Secondary | ICD-10-CM | POA: Diagnosis not present

## 2017-05-29 DIAGNOSIS — F419 Anxiety disorder, unspecified: Secondary | ICD-10-CM | POA: Diagnosis not present

## 2017-05-29 DIAGNOSIS — M1990 Unspecified osteoarthritis, unspecified site: Secondary | ICD-10-CM | POA: Diagnosis not present

## 2017-05-29 DIAGNOSIS — J45909 Unspecified asthma, uncomplicated: Secondary | ICD-10-CM | POA: Diagnosis not present

## 2017-06-01 ENCOUNTER — Encounter: Payer: Self-pay | Admitting: Rheumatology

## 2017-06-01 ENCOUNTER — Ambulatory Visit (INDEPENDENT_AMBULATORY_CARE_PROVIDER_SITE_OTHER): Payer: Medicare Other | Admitting: Rheumatology

## 2017-06-01 VITALS — BP 156/75 | HR 76 | Resp 15 | Ht 62.0 in | Wt 169.0 lb

## 2017-06-01 DIAGNOSIS — Z8639 Personal history of other endocrine, nutritional and metabolic disease: Secondary | ICD-10-CM

## 2017-06-01 DIAGNOSIS — M25511 Pain in right shoulder: Secondary | ICD-10-CM | POA: Diagnosis not present

## 2017-06-01 DIAGNOSIS — Z8719 Personal history of other diseases of the digestive system: Secondary | ICD-10-CM

## 2017-06-01 DIAGNOSIS — M5136 Other intervertebral disc degeneration, lumbar region: Secondary | ICD-10-CM | POA: Diagnosis not present

## 2017-06-01 DIAGNOSIS — I2510 Atherosclerotic heart disease of native coronary artery without angina pectoris: Secondary | ICD-10-CM | POA: Diagnosis not present

## 2017-06-01 DIAGNOSIS — M5134 Other intervertebral disc degeneration, thoracic region: Secondary | ICD-10-CM

## 2017-06-01 DIAGNOSIS — H209 Unspecified iridocyclitis: Secondary | ICD-10-CM | POA: Diagnosis not present

## 2017-06-01 DIAGNOSIS — Z8659 Personal history of other mental and behavioral disorders: Secondary | ICD-10-CM | POA: Diagnosis not present

## 2017-06-01 DIAGNOSIS — M5030 Other cervical disc degeneration, unspecified cervical region: Secondary | ICD-10-CM

## 2017-06-01 DIAGNOSIS — I25119 Atherosclerotic heart disease of native coronary artery with unspecified angina pectoris: Secondary | ICD-10-CM

## 2017-06-01 DIAGNOSIS — Z8679 Personal history of other diseases of the circulatory system: Secondary | ICD-10-CM

## 2017-06-01 DIAGNOSIS — M51369 Other intervertebral disc degeneration, lumbar region without mention of lumbar back pain or lower extremity pain: Secondary | ICD-10-CM

## 2017-06-01 DIAGNOSIS — M797 Fibromyalgia: Secondary | ICD-10-CM | POA: Diagnosis not present

## 2017-06-01 DIAGNOSIS — D869 Sarcoidosis, unspecified: Secondary | ICD-10-CM | POA: Diagnosis not present

## 2017-06-01 DIAGNOSIS — G8929 Other chronic pain: Secondary | ICD-10-CM

## 2017-06-01 NOTE — Patient Instructions (Signed)
Return in 1 month for lab work-ESR, ACE

## 2017-06-03 ENCOUNTER — Encounter (HOSPITAL_COMMUNITY)
Admission: RE | Admit: 2017-06-03 | Discharge: 2017-06-03 | Disposition: A | Discharge status: Home / Self Care | Payer: Medicare Other | Source: Ambulatory Visit | Attending: Cardiovascular Disease | Admitting: Cardiovascular Disease

## 2017-06-03 DIAGNOSIS — Z955 Presence of coronary angioplasty implant and graft: Secondary | ICD-10-CM

## 2017-06-03 DIAGNOSIS — J45909 Unspecified asthma, uncomplicated: Secondary | ICD-10-CM | POA: Diagnosis not present

## 2017-06-03 DIAGNOSIS — M1990 Unspecified osteoarthritis, unspecified site: Secondary | ICD-10-CM | POA: Diagnosis not present

## 2017-06-03 DIAGNOSIS — Z48812 Encounter for surgical aftercare following surgery on the circulatory system: Secondary | ICD-10-CM | POA: Diagnosis not present

## 2017-06-03 DIAGNOSIS — F419 Anxiety disorder, unspecified: Secondary | ICD-10-CM | POA: Diagnosis not present

## 2017-06-03 DIAGNOSIS — D649 Anemia, unspecified: Secondary | ICD-10-CM | POA: Diagnosis not present

## 2017-06-03 DIAGNOSIS — Z9861 Coronary angioplasty status: Secondary | ICD-10-CM | POA: Diagnosis not present

## 2017-06-05 ENCOUNTER — Encounter (HOSPITAL_COMMUNITY)
Admission: RE | Admit: 2017-06-05 | Discharge: 2017-06-05 | Disposition: A | Discharge status: Home / Self Care | Payer: Medicare Other | Source: Ambulatory Visit | Attending: Cardiovascular Disease | Admitting: Cardiovascular Disease

## 2017-06-05 ENCOUNTER — Encounter (HOSPITAL_COMMUNITY): Payer: Self-pay

## 2017-06-05 DIAGNOSIS — Z48812 Encounter for surgical aftercare following surgery on the circulatory system: Secondary | ICD-10-CM | POA: Diagnosis not present

## 2017-06-05 DIAGNOSIS — F419 Anxiety disorder, unspecified: Secondary | ICD-10-CM | POA: Diagnosis not present

## 2017-06-05 DIAGNOSIS — Z955 Presence of coronary angioplasty implant and graft: Secondary | ICD-10-CM

## 2017-06-05 DIAGNOSIS — Z9861 Coronary angioplasty status: Secondary | ICD-10-CM | POA: Diagnosis not present

## 2017-06-05 DIAGNOSIS — D649 Anemia, unspecified: Secondary | ICD-10-CM | POA: Diagnosis not present

## 2017-06-05 DIAGNOSIS — J45909 Unspecified asthma, uncomplicated: Secondary | ICD-10-CM | POA: Diagnosis not present

## 2017-06-05 DIAGNOSIS — M1990 Unspecified osteoarthritis, unspecified site: Secondary | ICD-10-CM | POA: Diagnosis not present

## 2017-06-05 NOTE — Progress Notes (Signed)
Discharge Progress Report  Patient Details  Name: Sheila Reynolds MRN: [id number] Date of Birth: April 17, 1945 Referring Provider:     CARDIAC REHAB PHASE II ORIENTATION from 02/24/2017 in [place]  Referring Provider  Mertie Moores MD       Number of Visits: 34  Reason for Discharge:  Patient independent in their exercise.  Smoking History:  Social History   Tobacco Use  Smoking Status Former Smoker  . Packs/day: 1.00  . Years: 4.00  . Pack years: 4.00  . Types: Cigarettes  . Last attempt to quit: 02/11/1980  . Years since quitting: 37.3  Smokeless Tobacco Never Used    Diagnosis:  Status post coronary artery stent placement  ADL UCSD:   Initial Exercise Prescription: Initial Exercise Prescription - 02/24/17 1000      Date of Initial Exercise RX and Referring Provider   Date  02/24/17    Referring Provider  [name], Philip MD      Treadmill   MPH  2    Grade  0    Minutes  10    METs  2.53      Recumbant Bike   Level  1.5    Minutes  10    METs  1.5      NuStep   Level  2    SPM  60    Minutes  10    METs  1.7      Prescription Details   Frequency (times per week)  3    Duration  Progress to 30 minutes of continuous aerobic without signs/symptoms of physical distress      Intensity   THRR 40-80% of Max Heartrate  60-129    Ratings of Perceived Exertion  11-15    Perceived Dyspnea  0-4      Progression   Progression  Continue to progress workloads to maintain intensity without signs/symptoms of physical distress.      Resistance Training   Training Prescription  Yes    Weight  1lb    Reps  10-15       Discharge Exercise Prescription (Final Exercise Prescription Changes): Exercise Prescription Changes - 05/27/17 0713      Response to Exercise   Blood Pressure (Admit)  134/80    Blood Pressure (Exercise)  154/74    Blood Pressure (Exit)  130/70    Heart Rate (Admit)  83 bpm    Heart Rate (Exercise)   127 bpm    Heart Rate (Exit)  93 bpm    Rating of Perceived Exertion (Exercise)  12    Perceived Dyspnea (Exercise)  0    Duration  Continue with 45 min of aerobic exercise without signs/symptoms of physical distress.    Intensity  THRR unchanged      Progression   Progression  Continue to progress workloads to maintain intensity without signs/symptoms of physical distress.    Average METs  3.16      Resistance Training   Training Prescription  Yes Uses 3lb weight in right shoulder, no weight in left shoulde    Weight  3lbs    Reps  10-15    Time  10 Minutes      Recumbant Bike   Level  3    Minutes  10    METs  3.68      NuStep   Level  5    SPM  85    Minutes  10  METs  2.9      Track   Laps  11    Minutes  10    METs  2.91      Home Exercise Plan   Plans to continue exercise at  [employer] (comment) [facility]    Frequency  Add 2 additional days to program exercise sessions.    Initial Home Exercises Provided  04/07/17       Functional Capacity: 6 Minute Walk    Row Name 02/24/17 1056 05/25/17 1512       6 Minute Walk   Phase  Initial  Discharge    Distance  1270 feet  1665 feet    Distance % Change  -  31.1 %    Distance Feet Change  -  395 ft    Walk Time  6 minutes  6 minutes    # of Rest Breaks  0  0    MPH  2.4  3.15    METS  2.8  3.68    RPE  11  11    Perceived Dyspnea   -  0    VO2 Peak  9.8  12.89    Symptoms  Yes (comment)  No    Comments  knee pain, neck pain an d R arm pain (denied chest pain)  -    Resting HR  65 bpm  85 bpm    Resting BP  122/60  124/70    Resting Oxygen Saturation   98 %  -    Exercise Oxygen Saturation  during 6 min walk  98 %  -    Max Ex. HR  105 bpm  124 bpm    Max Ex. BP  144/80  142/80    2 Minute Post BP  122/84  124/80       Psychological, QOL, Others - Outcomes: PHQ 2/9: Depression screen [facility] 2/9 06/05/2017 03/02/2017 01/27/2017  Decreased Interest 1 0 0  Down, Depressed, Hopeless  0 0 1  PHQ - 2 Score 1 0 1    Quality of Life: Quality of Life - 02/24/17 1103      Quality of Life Scores   Health/Function Pre  18.7 %    Socioeconomic Pre  20.38 %    Psych/Spiritual Pre  23.14 %    Family Pre  22.8 %    GLOBAL Pre  20.56 %       Personal Goals: Goals established at orientation with interventions provided to work toward goal. Personal Goals and Risk Factors at Admission - 02/24/17 1103      Core Components/Risk Factors/Patient Goals on Admission    Weight Management  Yes;Obesity;Weight Maintenance;Weight Loss    Intervention  Weight Management: Develop a combined nutrition and exercise program designed to reach desired caloric intake, while maintaining appropriate intake of nutrient and fiber, sodium and fats, and appropriate energy expenditure required for the weight goal.;Weight Management: Provide education and appropriate resources to help participant work on and attain dietary goals.;Weight Management/Obesity: Establish reasonable short term and long term weight goals.;Obesity: Provide education and appropriate resources to help participant work on and attain dietary goals.    Admit Weight  166 lb 14.2 oz (75.7 kg)    Goal Weight: Short Term  160 lb (72.6 kg)    Goal Weight: Long Term  150 lb (68 kg)    Expected Outcomes  Short Term: Continue to assess and modify interventions until short term weight is achieved;Long Term: Adherence  to nutrition and physical activity/exercise program aimed toward attainment of established weight goal;Weight Maintenance: Understanding of the daily nutrition guidelines, which includes 25-35% calories from fat, 7% or less cal from saturated fats, less than 200mg  cholesterol, less than 1.5gm of sodium, & 5 or more servings of fruits and vegetables daily;Weight Loss: Understanding of general recommendations for a balanced deficit meal plan, which promotes 1-2 lb weight loss per week and includes a negative energy balance of [phone]  kcal/d;Understanding recommendations for meals to include 15-35% energy as protein, 25-35% energy from fat, 35-60% energy from carbohydrates, less than 200mg  of dietary cholesterol, 20-35 gm of total fiber daily;Understanding of distribution of calorie intake throughout the day with the consumption of 4-5 meals/snacks    Improve shortness of breath with ADL's  Yes    Intervention  Provide education, individualized exercise plan and daily activity instruction to help decrease symptoms of SOB with activities of daily living.    Expected Outcomes  Short Term: Achieves a reduction of symptoms when performing activities of daily living.    Diabetes  Yes    Intervention  Provide education about signs/symptoms and action to take for hypo/hyperglycemia.;Provide education about proper nutrition, including hydration, and aerobic/resistive exercise prescription along with prescribed medications to achieve blood glucose in normal ranges: Fasting glucose 65-99 mg/dL    Expected Outcomes  Long Term: Attainment of HbA1C < 7%.;Short Term: Participant verbalizes understanding of the signs/symptoms and immediate care of hyper/hypoglycemia, proper foot care and importance of medication, aerobic/resistive exercise and nutrition plan for blood glucose control.    Hypertension  Yes    Intervention  Provide education on lifestyle modifcations including regular physical activity/exercise, weight management, moderate sodium restriction and increased consumption of fresh fruit, vegetables, and low fat dairy, alcohol moderation, and smoking cessation.;Monitor prescription use compliance.    Expected Outcomes  Short Term: Continued assessment and intervention until BP is < 140/3mm HG in hypertensive participants. < 130/60mm HG in hypertensive participants with diabetes, heart failure or chronic kidney disease.;Long Term: Maintenance of blood pressure at goal levels.    Lipids  Yes    Intervention  Provide education and support for  participant on nutrition & aerobic/resistive exercise along with prescribed medications to achieve LDL 70mg , HDL >40mg .    Expected Outcomes  Short Term: Participant states understanding of desired cholesterol values and is compliant with medications prescribed. Participant is following exercise prescription and nutrition guidelines.;Long Term: Cholesterol controlled with medications as prescribed, with individualized exercise RX and with personalized nutrition plan. Value goals: LDL < 70mg , HDL > 40 mg.    Stress  Yes    Intervention  Offer individual and/or small group education and counseling on adjustment to heart disease, stress management and health-related lifestyle change. Teach and support self-help strategies.;Refer participants experiencing significant psychosocial distress to appropriate mental health specialists for further evaluation and treatment. When possible, include family members and significant others in education/counseling sessions.    Expected Outcomes  Short Term: Participant demonstrates changes in health-related behavior, relaxation and other stress management skills, ability to obtain effective social support, and compliance with psychotropic medications if prescribed.;Long Term: Emotional wellbeing is indicated by absence of clinically significant psychosocial distress or social isolation.        Personal Goals Discharge: Goals and Risk Factor Review    Row Name 04/02/17 1115 04/30/17 1309 05/28/17 0810         Core Components/Risk Factors/Patient Goals Review   Personal Goals Review  Weight Management/Obesity;Lipids;Hypertension;Diabetes;Stress  Weight Management/Obesity;Lipids;Hypertension;Diabetes;Stress  Weight Management/Obesity;Lipids;Hypertension;Diabetes;Stress     Review  Illene vital signs and CBG's have been stable at cardiac rehab.  Sheila Reynolds vital signs and CBG's have been stable at cardiac rehab.  Pt with multiple CAD RF participates willingly in CR  Program.  Sheila Reynolds reports an increase in energy that she is grateful for.      Expected Outcomes  Sheila Reynolds will continue to take her medicaiton for HTN Hyperlipidemia and Diabetes as presribed. Sheila Reynolds will continue to participate in phase 2 cardiac rehab  Sheila Reynolds will continue to take her medicaiton for HTN Hyperlipidemia and Diabetes as presribed. Sheila Reynolds will continue to participate in phase 2 cardiac rehab  Pt will continue to participate in CR exercise and RF modifcation opportunities.         Exercise Goals and Review: Exercise Goals    Row Name 02/24/17 0954 02/24/17 0959 02/24/17 1102         Exercise Goals   Increase Physical Activity  Yes  -  -     Intervention  Provide advice, education, support and counseling about physical activity/exercise needs.;Develop an individualized exercise prescription for aerobic and resistive training based on initial evaluation findings, risk stratification, comorbidities and participant's personal goals.  -  -     Expected Outcomes  Achievement of increased cardiorespiratory fitness and enhanced flexibility, muscular endurance and strength shown through measurements of functional capacity and personal statement of participant.  -  -     Increase Strength and Stamina  Yes  - be able to climb stairs without SOB, improve energy levels and desire to exercise  Yes     Intervention  Provide advice, education, support and counseling about physical activity/exercise needs.;Develop an individualized exercise prescription for aerobic and resistive training based on initial evaluation findings, risk stratification, comorbidities and participant's personal goals.  -  -     Expected Outcomes  Achievement of increased cardiorespiratory fitness and enhanced flexibility, muscular endurance and strength shown through measurements of functional capacity and personal statement of participant.  -  -     Able to understand and use rate of perceived exertion (RPE)  scale  Yes  -  -     Intervention  Provide education and explanation on how to use RPE scale  -  -     Expected Outcomes  Short Term: Able to use RPE daily in rehab to express subjective intensity level;Long Term:  Able to use RPE to guide intensity level when exercising independently  -  -     Knowledge and understanding of Target Heart Rate Range (THRR)  Yes  -  -     Intervention  Provide education and explanation of THRR including how the numbers were predicted and where they are located for reference  -  -     Expected Outcomes  Long Term: Able to use THRR to govern intensity when exercising independently;Short Term: Able to state/look up THRR;Short Term: Able to use daily as guideline for intensity in rehab  -  -     Able to check pulse independently  Yes  -  -     Intervention  Provide education and demonstration on how to check pulse in carotid and radial arteries.;Review the importance of being able to check your own pulse for safety during independent exercise  -  -     Expected Outcomes  Short Term: Able to explain why pulse checking is important during independent exercise;Long Term: Able to check pulse independently and accurately  -  -  Understanding of Exercise Prescription  Yes  -  -     Intervention  Provide education, explanation, and written materials on patient's individual exercise prescription  -  -     Expected Outcomes  Short Term: Able to explain program exercise prescription;Long Term: Able to explain home exercise prescription to exercise independently  -  -        Nutrition & Weight - Outcomes: Pre Biometrics - 05/25/17 1508      Pre Biometrics   Height  5\' 2"  (1.575 m)    Weight  168 lb 3.4 oz (76.3 kg)    Waist Circumference  37.5 inches    Hip Circumference  41 inches    Waist to Hip Ratio  0.91 %    BMI (Calculated)  30.76    Triceps Skinfold  41 mm    % Body Fat  44 %    Grip Strength  24 kg    Flexibility  11 in    Single Leg Stand  9.12 seconds         Nutrition: Nutrition Therapy & Goals - 02/25/17 1117      Nutrition Therapy   Diet  Carb Modified, Heart Healthy      Personal Nutrition Goals   Nutrition Goal  Pt to identify food quantities necessary to achieve weight loss of 6-15 lb at graduation from cardiac rehab.      Intervention Plan   Intervention  Prescribe, educate and counsel regarding individualized specific dietary modifications aiming towards targeted core components such as weight, hypertension, lipid management, diabetes, heart failure and other comorbidities.    Expected Outcomes  Short Term Goal: Understand basic principles of dietary content, such as calories, fat, sodium, cholesterol and nutrients.;Long Term Goal: Adherence to prescribed nutrition plan.       Nutrition Discharge: Nutrition Assessments - 02/25/17 1118      MEDFICTS Scores   Pre Score  30       Education Questionnaire Score: Knowledge Questionnaire Score - 02/24/17 1101      Knowledge Questionnaire Score   Pre Score  20/24       Goals reviewed with patient; copy given to patient.

## 2017-06-08 ENCOUNTER — Encounter (HOSPITAL_COMMUNITY)
Admission: RE | Admit: 2017-06-08 | Discharge: 2017-06-08 | Disposition: A | Discharge status: Home / Self Care | Payer: Medicare Other | Source: Ambulatory Visit | Attending: Cardiovascular Disease | Admitting: Cardiovascular Disease

## 2017-06-08 DIAGNOSIS — Z9861 Coronary angioplasty status: Secondary | ICD-10-CM | POA: Diagnosis not present

## 2017-06-08 DIAGNOSIS — J45909 Unspecified asthma, uncomplicated: Secondary | ICD-10-CM | POA: Diagnosis not present

## 2017-06-08 DIAGNOSIS — M1990 Unspecified osteoarthritis, unspecified site: Secondary | ICD-10-CM | POA: Diagnosis not present

## 2017-06-08 DIAGNOSIS — D649 Anemia, unspecified: Secondary | ICD-10-CM | POA: Diagnosis not present

## 2017-06-08 DIAGNOSIS — Z955 Presence of coronary angioplasty implant and graft: Secondary | ICD-10-CM

## 2017-06-08 DIAGNOSIS — F419 Anxiety disorder, unspecified: Secondary | ICD-10-CM | POA: Diagnosis not present

## 2017-06-08 DIAGNOSIS — Z48812 Encounter for surgical aftercare following surgery on the circulatory system: Secondary | ICD-10-CM | POA: Diagnosis not present

## 2017-06-09 ENCOUNTER — Ambulatory Visit: Payer: Medicare Other | Admitting: Internal Medicine

## 2017-06-17 ENCOUNTER — Encounter: Payer: Self-pay | Admitting: Internal Medicine

## 2017-06-18 ENCOUNTER — Encounter (HOSPITAL_COMMUNITY): Payer: Self-pay

## 2017-06-18 ENCOUNTER — Ambulatory Visit (INDEPENDENT_AMBULATORY_CARE_PROVIDER_SITE_OTHER): Payer: Medicare Other | Admitting: Internal Medicine

## 2017-06-18 ENCOUNTER — Encounter: Payer: Self-pay | Admitting: Internal Medicine

## 2017-06-18 VITALS — BP 116/74 | HR 77 | Ht 62.0 in | Wt 166.6 lb

## 2017-06-18 DIAGNOSIS — J0100 Acute maxillary sinusitis, unspecified: Secondary | ICD-10-CM

## 2017-06-18 DIAGNOSIS — I2510 Atherosclerotic heart disease of native coronary artery without angina pectoris: Secondary | ICD-10-CM

## 2017-06-18 DIAGNOSIS — D869 Sarcoidosis, unspecified: Secondary | ICD-10-CM | POA: Diagnosis not present

## 2017-06-18 DIAGNOSIS — G4733 Obstructive sleep apnea (adult) (pediatric): Secondary | ICD-10-CM

## 2017-06-18 MED ORDER — AMOXICILLIN-POT CLAVULANATE 875-125 MG PO TABS: ORAL_TABLET | ORAL | 0 refills | Status: DC

## 2017-06-18 MED ORDER — PHENYLEPHRINE HCL 1 % NA SOLN
3.0000 [drp] | Freq: Once | NASAL | Status: AC
  Administered 2017-06-18: 3 [drp] via NASAL

## 2017-06-18 NOTE — Progress Notes (Signed)
HPI  F former smoker followed for bronchitis, hx  Occular sarcoid, bronchitis/ nodules, OSA complicated by GERD, glaucoma, DM2 NPSG 12/25/03- AHI  2.9/ hr, desaturation to 91%, body weight 164 lbs NPSG-10/18/13- Mild OSA, AHI 9.3/ hr, weight 164 lbs  ACE 09/17/15-47-WNL ACE level 08/18/16-30 PFT: 01/07/2011-FEV1 1.59/79%, FEV1/FVC 0.73, FEF 25-75% improved to 38% with bronchodilator. TLC 90% DLCO 55% Office Spirometry 06/09/16- moderate restriction of exhaled volume. FVC 1.51/68%, FEV1 1.20/70%, ratio 0.79, FEF 25-75% 1.19/73% Nuclear Stress Test 09/04/16- EF 65%, low risk. ECHO 07/31/16- Gr 1 DD ---------------------------------------------------------------------------------------------------------------------- 02/18/17- 72 yo female former smoker followed for bronchitis/bronchiolitis/lung nodules, history of ocular sarcoid, OSA, complicated by GERD, Glaucoma, DM 2, CAD CPAP 9/Advanced ------OSA, air is escaping and blowing in her face,uses [facility] She has not been using CPAP regularly of uncomfortable mask fit and air leak.Hollace Kinnier shows good control AHI 2.6/hour. She had persistent cough productive of clear / trace yellow sputum with no fever or sore throat.  Occasional wheeze.  Treated for glaucoma.  Glaucoma surgery.  Taking timolol eyedrops. Coronary arteries stented Symbicort 160, nebulized albuterol, Tessalon, albuterol HFA used less than once per week. CT chest 11/21/16-  IMPRESSION: Stable peribronchovascular nodularity in the posterior right upper lobe, favoring post infectious/inflammatory scarring. Aortic Atherosclerosis (ICD10-I70.0).  06/18/2017- 72 yo female former smoker followed for bronchitis/bronchiolitis/lung nodules, history of ocular sarcoid, OSA, complicated by GERD, Glaucoma, DM 2, CAD/ stent CPAP 9/Advanced ----OSA; DME: [facility]. Pt wears CPAP nightly and DL attached. Pressure works well for pt and no new supplies needed at this time.  Download 80% compliance AHI 1.4/hour.  She  sleeps better with CPAP. Concerned she has a sinus infection which did not clear with 7 days of amoxicillin.  Cough is productive as she is developing a bronchitis.  No fever.  Slightly discolored sputum.  Little headache. Pending glaucoma/cataract surgery end of this month.  ROS-see HPI     + = positive Constitutional:   No-   weight loss, night sweats, fevers, chills, fatigue, lassitude. HEENT:   +  headaches, difficulty swallowing, tooth/dental problems, sore throat,       Some  sneezing, itching, ear ache, +nasal congestion, post nasal drip,  CV: + chest pain, no-orthopnea, PND, swelling in lower extremities, anasarca, dizziness, palpitations Resp: +  shortness of breath with exertion not at rest.            +productive cough,  + non-productive cough,  No- coughing up of blood.               in color of mucus.   wheezing.   Skin: No-   rash or lesions. GI:  +  heartburn, indigestion, abdominal pain, nausea, vomiting,  GU:  MS:  +  joint pain or swelling, + back pain Neuro-     nothing unusual Psych:  No- change in mood or affect. No depression or anxiety.  No memory loss.  OBJ   General- Alert, Oriented, Affect anxious, Distress- none acute Skin- rash-none, lesions- none, excoriation- none Lymphadenopathy- none Head- atraumatic            Eyes- Gross vision intact, PERRLA, conjunctivae clear secretions- not injected            Ears- Hearing aid             Nose- + turbinate edema, no-Septal dev, mucus, polyps, erosion, perforation             Throat- Mallampati III , mucosa clear , drainage- none,  tonsils- atrophic Neck- flexible , trachea midline, no stridor , thyroid nl, carotid no bruit Chest - symmetrical excursion , unlabored           Heart/CV- RRR , no murmur , no gallop  , no rub, nl s1 s2                           - JVD- none , edema- none, stasis changes- none, varices- none           Lung-  cough + raspy , wheeze-none, rhonchi-none , dullness-none, rub- none            Chest wall-  Abd- No HSM Br/ Gen/ Rectal- Not done, not indicated Extrem- cyanosis- none, clubbing, none, atrophy- none, strength- nl.  Neuro- grossly intact to observation

## 2017-06-18 NOTE — Patient Instructions (Signed)
Script sent for augmentin antibiotic  Order- neb neo nasal     Dx acute maxillary sinusitis  Ok to continue CPAP 9, mask of choice, humidifier, supplies, AirView  Please call as needed

## 2017-06-21 NOTE — Assessment & Plan Note (Signed)
Compliance and control are good.  She continues to benefit from CPAP, sleeping better with it.

## 2017-06-21 NOTE — Assessment & Plan Note (Signed)
We can give a decongestant nasal treatment and extend antibiotic coverage with 10 days of Augmentin.  Hopefully this will have her cleared up well in advance of her pending eye surgery.[name]

## 2017-06-21 NOTE — Assessment & Plan Note (Addendum)
Clinically inactive but with residual scarring.

## 2017-07-03 ENCOUNTER — Telehealth: Payer: Self-pay | Admitting: Cardiovascular Disease

## 2017-07-03 NOTE — Telephone Encounter (Signed)
° °  [facility] Pre-operative Risk Assessment    Request for surgical clearance:  1. What type of surgery is being performed? Left eye Cataract    2. When is this surgery scheduled? 07/07/17   3. What type of clearance is required (medical clearance vs. Pharmacy clearance to hold med vs. Both)? Medical  Are there any medications that need to be held prior to surgery and how long? No 4. Practice name and name of physician performing surgery?Dr [email or web address]  [place]  5. What is your office phone number [phone]   7.   What is your office fax number [phone]  8.   Anesthesia type (None, local, MAC, general) ? Local    Sheila Reynolds 07/03/2017, 9:01 AM  _________________________________________________________________   (provider comments below)

## 2017-07-03 NOTE — Telephone Encounter (Signed)
   Primary Cardiologist: Mertie Moores, MD  Chart reviewed as part of pre-operative protocol coverage. Given past medical history and time since last visit, based on ACC/AHA guidelines, Ladrea H Mccole would be at acceptable risk for the planned procedure without further cardiovascular testing.  Patient should not stop the Asprin or plavix for the procedure.  She had PTCA and Stent to her LAD coronary artery 01/27/18.  If she can have without stopping then she may proceed.    I will route this recommendation to the requesting party via Epic fax function and remove from pre-op pool.  Please call with questions.  [name], NP 07/03/2017, 1:38 PM

## 2017-07-07 DIAGNOSIS — H25012 Cortical age-related cataract, left eye: Secondary | ICD-10-CM | POA: Diagnosis not present

## 2017-07-07 DIAGNOSIS — Z7982 Long term (current) use of aspirin: Secondary | ICD-10-CM | POA: Diagnosis not present

## 2017-07-07 DIAGNOSIS — E119 Type 2 diabetes mellitus without complications: Secondary | ICD-10-CM | POA: Diagnosis not present

## 2017-07-07 DIAGNOSIS — Z7902 Long term (current) use of antithrombotics/antiplatelets: Secondary | ICD-10-CM | POA: Diagnosis not present

## 2017-07-07 DIAGNOSIS — H25042 Posterior subcapsular polar age-related cataract, left eye: Secondary | ICD-10-CM | POA: Diagnosis not present

## 2017-07-07 DIAGNOSIS — Z7984 Long term (current) use of oral hypoglycemic drugs: Secondary | ICD-10-CM | POA: Diagnosis not present

## 2017-07-07 DIAGNOSIS — I2510 Atherosclerotic heart disease of native coronary artery without angina pectoris: Secondary | ICD-10-CM | POA: Diagnosis not present

## 2017-07-07 DIAGNOSIS — H21542 Posterior synechiae (iris), left eye: Secondary | ICD-10-CM | POA: Diagnosis not present

## 2017-07-07 DIAGNOSIS — Z87891 Personal history of nicotine dependence: Secondary | ICD-10-CM | POA: Diagnosis not present

## 2017-07-07 DIAGNOSIS — G4730 Sleep apnea, unspecified: Secondary | ICD-10-CM | POA: Diagnosis not present

## 2017-07-07 DIAGNOSIS — Z8614 Personal history of Methicillin resistant Staphylococcus aureus infection: Secondary | ICD-10-CM | POA: Diagnosis not present

## 2017-07-07 DIAGNOSIS — Z91013 Allergy to seafood: Secondary | ICD-10-CM | POA: Diagnosis not present

## 2017-07-07 DIAGNOSIS — I10 Essential (primary) hypertension: Secondary | ICD-10-CM | POA: Diagnosis not present

## 2017-07-07 DIAGNOSIS — E669 Obesity, unspecified: Secondary | ICD-10-CM | POA: Diagnosis not present

## 2017-07-07 DIAGNOSIS — J45909 Unspecified asthma, uncomplicated: Secondary | ICD-10-CM | POA: Diagnosis not present

## 2017-07-07 DIAGNOSIS — H401122 Primary open-angle glaucoma, left eye, moderate stage: Secondary | ICD-10-CM | POA: Diagnosis not present

## 2017-07-07 DIAGNOSIS — Z955 Presence of coronary angioplasty implant and graft: Secondary | ICD-10-CM | POA: Diagnosis not present

## 2017-07-07 DIAGNOSIS — Z9841 Cataract extraction status, right eye: Secondary | ICD-10-CM | POA: Diagnosis not present

## 2017-07-07 DIAGNOSIS — K219 Gastro-esophageal reflux disease without esophagitis: Secondary | ICD-10-CM | POA: Diagnosis not present

## 2017-07-07 DIAGNOSIS — H2512 Age-related nuclear cataract, left eye: Secondary | ICD-10-CM | POA: Diagnosis not present

## 2017-07-07 DIAGNOSIS — Z79899 Other long term (current) drug therapy: Secondary | ICD-10-CM | POA: Diagnosis not present

## 2017-07-07 DIAGNOSIS — Z885 Allergy status to narcotic agent status: Secondary | ICD-10-CM | POA: Diagnosis not present

## 2017-07-09 ENCOUNTER — Encounter: Payer: Self-pay | Admitting: Cardiovascular Disease

## 2017-07-09 ENCOUNTER — Encounter: Payer: Self-pay | Admitting: Nurse Practitioner

## 2017-07-09 ENCOUNTER — Ambulatory Visit (INDEPENDENT_AMBULATORY_CARE_PROVIDER_SITE_OTHER): Payer: Medicare Other | Admitting: Cardiovascular Disease

## 2017-07-09 VITALS — BP 160/66 | HR 80 | Ht 62.0 in | Wt 165.4 lb

## 2017-07-09 DIAGNOSIS — I25118 Atherosclerotic heart disease of native coronary artery with other forms of angina pectoris: Secondary | ICD-10-CM

## 2017-07-09 DIAGNOSIS — I209 Angina pectoris, unspecified: Secondary | ICD-10-CM

## 2017-07-09 DIAGNOSIS — I2510 Atherosclerotic heart disease of native coronary artery without angina pectoris: Secondary | ICD-10-CM | POA: Diagnosis not present

## 2017-07-09 MED ORDER — ISOSORBIDE MONONITRATE ER 60 MG PO TB24: 60.0000 mg | ORAL_TABLET | Freq: Every day | ORAL | 3 refills | Status: DC

## 2017-07-09 MED ORDER — CARVEDILOL 12.5 MG PO TABS: 12.5000 mg | ORAL_TABLET | Freq: Two times a day (BID) | ORAL | 3 refills | Status: DC

## 2017-07-09 NOTE — Progress Notes (Signed)
Cardiology Office Note   Date:  07/09/2017   ID:  Sheila Reynolds, Sheila Reynolds [birth year], MRN [id number]  PCP:  Glendale Chard, MD  Cardiologist:   Mertie Moores, MD   No chief complaint on file.  1. Hyperlipidemia 2.  CAD -  orbital atherectomy and PCI using Xience Sierra 3.5 x 33 mm DES to prox / mid LAD  3. Nonsustained ventricular tachycardia 4. Mild to moderate coronary artery disease by cath in 2009. 5. 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 Darrick Penna is a 72 y.o. female with the above noted hx. She has continued to have some palpitations. She has occasional episodes of lightheadedness when she has palpitations. Her main issue has been lots of total body cramps. She has been seen in the emergency room. Her lab work looked fine.  She's had lots of dyspnea especially with exertion. She has a history of sarcoidosis and is followed by Dr. Annamaria Boots.  March 11, 2012: Sheila Reynolds has had more palpitations recently. It Appears that her metoprolol dose was decreased slightly. We placed an event monitor on her. She did not have any episodes of atrial fibrillation. She has occasional drinker atrial contractions and occasional premature ventricular contractions. There were no life-threatening arrhythmias.  She is doing well from a cardiac standpoint. She's not had any episodes of syncope. She has significant shortness of breath especially when climbing stairs. This is likely due to her sarcoidosis. She denies any chest pain. Her BP is a bit elevated. She avoids salt.   May 02, 2013 :  She presented to see Richardson Dopp in February with some increased palpitations and lightheadedness. He recommended that she decrease her caffeine intake. He placed a 21 day event monitor and instructed her to take an extra metoprolol as needed.  The palpitations can occur at any time. They occur with rest and with exertion. They're not constant. The last 30-45 seconds and occur multiple times  through the day.  Sept. 16, 2015:  Still having palpitations. She has had more leg edema and her medical doctor on Lasix which he now takes about every other day. She was not on potassium replacement. This may be the cause of her increased PVCs.   May 24, 2014:  Sheila Reynolds is a 72 y.o. female who presents for follow up of her palpitations   Still has these palpitations.  No syncope.   Jan. 10, 2017:  Doing ok Still has DOE  Has palpitations on occasion. Not getting much exercise .  expalined a typical exercise program .   Start slow, gradually increase   May 07, 2016;   Sheila Reynolds is seen today for follow up .  Has been having more palpitations Seem to be getting worse  Went to there ER last week -  the ER note does not describe any specific arrhythmias. No passing out but she did get lightheaded when she had palpitations on one occasion   Has rare episodes of atypical CP .  Lasts a second Not associated with exertion .  Walks 1.5 miles 3-4 times a week .   This walking does not cause any palpitation of CP   July 17, 2016 Sheila Reynolds is seen today for follow up  30 day monitor showed no significant arrhythmias  Having leg pain and swelling  during night and day  Not worsened by exercise  No CP or dyspnea - still walking 1.5 miles 3-4 days a week   December 22, 2016:  Sheila Reynolds had a cath recently that showed a 70 % LAD stenosis . Has right arm pain but this is not exertional . Has pins and needles sensation Has DOE with stairs and carrying something heavy .  Has not really noticed any difference in how she feels on the new meds which include Imdur 30 mg , Plavix 75 ,  + cough, chronic ,  No hemoptysis. Does not exercise regularly .    January 29, 2017: Sheila Reynolds is seen today for a follow-up visit.  She recently had rotational atherectomy and stenting of her proximal LAD and mid LAD. Still has some pins and needles sensation Has been walking in the mall.   Does not have CP . Has some DOE with that   .  Jul 09, 2017:   Sheila Reynolds is doing well.  She is status post rotational atherectomy and stent placement to her proximal/mid LAD in December, 2018. Had left eye surgery in left eye.    Occasional CP ,  Thinks it might be indigestion .  Not associated with eating or drinking . Pain was on the left side of left breast.   Lasts a few minutes Did not have much pain prior to her PCI to LAD .  Has been exercising .   Exercise does not cause any CP .  Relieved with SL NTG   BP has been elevated.   Past Medical History:  Diagnosis Date  . Anemia    3 months ago anemic  . Anxiety    on meds  . Arthritis    "all over" (01/15/2017)  . Asthma   . Bronchitis with emphysema   . Chest pain   . Chronic bronchitis ([place])   . Coronary artery disease    a. 01/2017 she underwent orbital atherectomy/DES to the proxmal LAD and PTCA to ostial D2. 2D Echo 01/15/17 showed mild LVH, EF 60-65%, grade 1 DD.  . Family history of anesthesia complication    daughter N/V  . Fibromyalgia   . GERD (gastroesophageal reflux disease)    on meds  . Heart murmur   . History of hiatal hernia   . Hx of echocardiogram    Echo (03/2013):  Tech limited; Mild focal basal septal hypertrophy, EF 60-65%, normal RVF  . Hyperlipidemia   . Hypertension   . Nonsustained ventricular tachycardia ([place])   . OSA on CPAP   . Premature atrial contractions   . PVC (premature ventricular contraction)    a. Holter 12/16: NSR, occ PAC,PVCs  . Sarcoidosis   . Type II diabetes mellitus ([place])     Past Surgical History:  Procedure Laterality Date  . BREAST SURGERY    . CARDIAC CATHETERIZATION  05/04/2007   reveals overall normal left ventricular systolic function. Ejection fraction 65-70%  . CATARACT EXTRACTION W/PHACO Right 07/20/2013   Procedure: CATARACT EXTRACTION PHACO AND INTRAOCULAR LENS PLACEMENT (IOC);  Surgeon: Marylynn Pearson, MD;  Location: [facility];  Service: Ophthalmology;   Laterality: Right;  . COLONOSCOPY W/ BIOPSIES AND POLYPECTOMY    . COLONOSCOPY WITH PROPOFOL N/A 09/07/2014   Procedure: COLONOSCOPY WITH PROPOFOL;  Surgeon: Juanita Craver, MD;  Location: [facility];  Service: Endoscopy;  Laterality: N/A;  . CORONARY ANGIOPLASTY WITH STENT PLACEMENT  01/15/2017  . CORONARY ATHERECTOMY N/A 01/15/2017   Procedure: CORONARY ATHERECTOMY;  Surgeon: Nelva Bush, MD;  Location: [facility];  Service: Cardiovascular;  Laterality: N/A;  . CORONARY BALLOON ANGIOPLASTY N/A 01/15/2017   Procedure: CORONARY BALLOON ANGIOPLASTY;  Surgeon:  End, Harrell Gave, MD;  Location: [facility];  Service: Cardiovascular;  Laterality: N/A;  . CORONARY STENT INTERVENTION N/A 01/15/2017   Procedure: CORONARY STENT INTERVENTION;  Surgeon: Nelva Bush, MD;  Location: [place] [facility];  Service: Cardiovascular;  Laterality: N/A;  . ESOPHAGOGASTRODUODENOSCOPY (EGD) WITH PROPOFOL N/A 09/07/2014   Procedure: ESOPHAGOGASTRODUODENOSCOPY (EGD) WITH PROPOFOL;  Surgeon: Juanita Craver, MD;  Location: [facility];  Service: Endoscopy;  Laterality: N/A;  . EXTERNAL EAR SURGERY Bilateral 1970s   tumors removed  . EYE SURGERY    . INTRAVASCULAR PRESSURE WIRE/FFR STUDY N/A 12/12/2016   Procedure: INTRAVASCULAR PRESSURE WIRE/FFR STUDY;  Surgeon: Nelva Bush, MD;  Location: [facility] [facility];  Service: Cardiovascular;  Laterality: N/A;  . MINI SHUNT INSERTION Right 07/20/2013   Procedure: INSERTION OF GLAUCOMA FILTRATION DEVICE RIGHT EYE;  Surgeon: Marylynn Pearson, MD;  Location: [facility];  Service: Ophthalmology;  Laterality: Right;  . MITOMYCIN C APPLICATION Right 0/97/3532   Procedure: MITOMYCIN C APPLICATION;  Surgeon: Marylynn Pearson, MD;  Location: [facility];  Service: Ophthalmology;  Laterality: Right;  . MITOMYCIN C APPLICATION Right 9/92/4268   Procedure: MITOMYCIN C APPLICATION RIGHT EYE;  Surgeon: Marylynn Pearson, MD;  Location: [place];  Service: Ophthalmology;  Laterality: Right;  .  PLACEMENT OF BREAST IMPLANTS Bilateral 1992   "took all my breast tissue out; put implants in;fibrocystic breast disease "  . RIGHT/LEFT HEART CATH AND CORONARY ANGIOGRAPHY N/A 12/12/2016   Procedure: RIGHT/LEFT HEART CATH AND CORONARY ANGIOGRAPHY;  Surgeon: Nelva Bush, MD;  Location: [facility];  Service: Cardiovascular;  Laterality: N/A;  . TONSILLECTOMY    . TOTAL ABDOMINAL HYSTERECTOMY  1982  . TRABECULECTOMY Right 02/21/2015   Procedure: TRABECULECTOMY WITH [facility] ON THE RIGHT EYE;  Surgeon: Marylynn Pearson, MD;  Location: [place];  Service: Ophthalmology;  Laterality: Right;     Current Outpatient Medications  Medication Sig Dispense Refill  . acetaminophen (TYLENOL) 500 MG tablet Take 1,000 mg by mouth 2 (two) times daily as needed for moderate pain or headache.    . albuterol (PROVENTIL) (2.5 MG/3ML) 0.083% nebulizer solution Take 2.5 mg by nebulization daily as needed for wheezing or shortness of breath.     [name] aspirin EC 81 MG tablet Take 81 mg by mouth at bedtime.     [name] atorvastatin (LIPITOR) 40 MG tablet Take 1 tablet (40 mg total) by mouth daily. 30 tablet 6  . brimonidine (ALPHAGAN P) 0.1 % SOLN Place 1 drop into the left eye 3 (three) times daily.     . brinzolamide (AZOPT) 1 % ophthalmic suspension Place 1 drop into the left eye 3 (three) times daily.     . clopidogrel (PLAVIX) 75 MG tablet Take 1 tablet (75 mg total) by mouth daily. 30 tablet 11  . furosemide (LASIX) 20 MG tablet TAKE 1 TABLET BY MOUTH EVERY 3 DAYS 45 tablet 3  . guaiFENesin (ROBITUSSIN) 100 MG/5ML SOLN Take 5 mLs by mouth every 6 (six) hours as needed for cough or to loosen phlegm.     [name] HYDROcodone-acetaminophen (NORCO/VICODIN) 5-325 MG tablet Take 1 tablet by mouth at bedtime.    [name] ketorolac (ACULAR) 0.5 % ophthalmic solution Place 1 drop into the left eye 4 (four) times daily.    [name] loteprednol (LOTEMAX) 0.5 % ophthalmic suspension Place 3-4 drops into both eyes See admin instructions. Instill 6 drops  into the left eye once daily, and 3 drops in the right eye once daily    . Menthol,  Topical Analgesic, (BIOFREEZE EX) Apply 1 application topically daily as needed (foot pain).    . mometasone-formoterol (DULERA) 200-5 MCG/ACT AERO Inhale 2 puffs into the lungs as needed for wheezing.    . montelukast (SINGULAIR) 10 MG tablet Take 10 mg by mouth at bedtime.    . moxifloxacin (VIGAMOX) 0.5 % ophthalmic solution INSTILL 1 GTT INTO THE SURGICAL EYE TID STARTING 4 DAYS PRIOR TO SURGERY  0  . nitroGLYCERIN (NITROSTAT) 0.4 MG SL tablet Place 1 tablet (0.4 mg total) under the tongue every 5 (five) minutes as needed for chest pain. 25 tablet prn  . olmesartan (BENICAR) 20 MG tablet Take 20 mg by mouth daily.    . pantoprazole (PROTONIX) 40 MG tablet Take 1 tablet (40 mg total) by mouth daily. 30 tablet 6  . pregabalin (LYRICA) 75 MG capsule Take 75 mg by mouth at bedtime.    [name] PROAIR HFA 108 (90 Base) MCG/ACT inhaler Inhale 2 puffs into the lungs every 6 (six) hours as needed for wheezing. 1 Inhaler 12  . sitaGLIPtin-metformin (JANUMET) 50-500 MG tablet Take 1 tablet by mouth as directed. Take 1 tablet twice daily on Monday through Friday, take 1 tablet daily on Saturdays and Sundays    . timolol (BETIMOL) 0.5 % ophthalmic solution Place 1 drop into the left eye 2 (two) times daily.    [name] tolnaftate (TINACTIN) 1 % cream Apply 1 application topically daily as needed (foot itching).    . traMADol (ULTRAM) 50 MG tablet Take 1 tablet (50 mg total) by mouth every 6 (six) hours as needed. 40 tablet 0  . travoprost, benzalkonium, (TRAVATAN) 0.004 % ophthalmic solution Place 1 drop into both eyes at bedtime.    . carvedilol (COREG) 12.5 MG tablet Take 1 tablet (12.5 mg total) by mouth 2 (two) times daily. 180 tablet 3  . isosorbide mononitrate (IMDUR) 60 MG 24 hr tablet Take 1 tablet (60 mg total) by mouth daily. 90 tablet 3   No current facility-administered medications for this visit.     Allergies:   Crestor  [rosuvastatin calcium]; Demerol  [meperidine hcl]; Shellfish allergy; and Demerol [meperidine]    Social History:  The patient  reports that she quit smoking about 37 years ago. Her smoking use included cigarettes. She has a 4.00 pack-year smoking history. She has never used smokeless tobacco. She reports that she does not drink alcohol or use drugs.   Family History:  The patient's family history includes Cancer in her mother and paternal grandmother; Diabetes in her brother and father; Glaucoma in her brother and father; Heart disease in her father and paternal grandmother.    ROS: Noted in current history, otherwise review of systems is negative.  Physical Exam: Blood pressure (!) 160/66, pulse 80, height 5\' 2"  (1.575 m), weight 165 lb 6.4 oz (75 kg), SpO2 94 %.  GEN:  Well nourished, well developed in no acute distress HEENT: Normal NECK: No JVD; No carotid bruits LYMPHATICS: No lymphadenopathy CARDIAC: RRR  RESPIRATORY:  Clear to auscultation without rales, wheezing or rhonchi  ABDOMEN: Soft, non-tender, non-distended MUSCULOSKELETAL:  No edema; No deformity  SKIN: Warm and dry NEUROLOGIC:  Alert and oriented x 3  EKG:     Recent Labs: 03/23/2017: Hemoglobin 11.8; Platelets 196 03/31/2017: TSH 1.100 04/09/2017: ALT 13; BUN 12; Creatinine, Ser 0.81; Potassium 4.3; Sodium 138 04/20/2017: Magnesium 2.5    Lipid Panel    Component Value Date/Time   CHOL 129 04/09/2017 1107   TRIG 87 04/09/2017  1107   HDL 44 04/09/2017 1107   CHOLHDL 2.9 04/09/2017 1107   CHOLHDL 3 05/24/2014 1650   VLDL 20.8 05/24/2014 1650   LDLCALC 68 04/09/2017 1107      Wt Readings from Last 3 Encounters:  07/09/17 165 lb 6.4 oz (75 kg)  06/18/17 166 lb 9.6 oz (75.6 kg)  06/01/17 169 lb (76.7 kg)      Other studies Reviewed: Additional studies/ records that were reviewed today include: . Review of the above records demonstrates:    ASSESSMENT AND PLAN:   1.   Coronary artery disease:    S/p rotational atherectomy and stent placement to prox - mid LAD .  She is been having some episodes of chest discomfort / angina on occasion.  These last for several minutes and have been relieved with sublingual nitroglycerin.  At the same time, she is been exercising and has not had any episodes of angina.  To evaluate this further we will get a Lexiscan Myoview study. I would have a low threshold to proceed to heart catheterization. We will increase carvedilol to 12.5 mg twice a day and increase the isosorbide to 60 mg a day.  2. Hyperlipidemia -  .  Last lipid levels in February looked great.  Her LDL is 68.  Triglyceride level is 87.  4. Nonsustained ventricular tachycardia-    5.  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 Keturah Barre   Current medicines are reviewed at length with the patient today.  The patient does not have concerns regarding medicines.  The following changes have been made:  no change  Labs/ tests ordered today include:   Orders Placed This Encounter  Procedures  . Myocardial Perfusion Imaging     Disposition:   FU with me in 6 months   Signed, Mertie Moores, MD  07/09/2017 11:10 AM    [facility] [place], [place], [place]  [address] Phone: [phone]; Fax: [phone]

## 2017-07-09 NOTE — Patient Instructions (Signed)
Medication Instructions:  Your physician has recommended you make the following change in your medication:   INCREASE Carvedilol (Coreg) to 12.5 mg twice daily INCREASE Imdur (Isosorbide) to 60 mg once daily   Labwork: None Ordered   Testing/Procedures: Your physician has requested that you have a lexiscan myoview. For further information please visit [email or web address]. Please follow instruction sheet, as given.   Follow-Up: Your physician recommends that you schedule a follow-up appointment in: 3-4 months with Dr. Acie Fredrickson   If you need a refill on your cardiac medications before your next appointment, please call your pharmacy.   Thank you for choosing [facility]! Christen Bame, RN [phone]

## 2017-07-15 ENCOUNTER — Telehealth (HOSPITAL_COMMUNITY): Payer: Self-pay

## 2017-07-15 ENCOUNTER — Telehealth (HOSPITAL_COMMUNITY): Payer: Self-pay | Admitting: *Deleted

## 2017-07-15 NOTE — Telephone Encounter (Signed)
Left message on voicemail in reference to upcoming appointment scheduled for  07/20/17 Phone number given for a call back so details instructions can be given. Kirstie Peri

## 2017-07-15 NOTE — Telephone Encounter (Signed)
Patient given detailed instructions per Myocardial Perfusion Study Information Sheet for the test on 07/20/2017 at 10am. Patient notified to arrive 15 minutes early and that it is imperative to arrive on time for appointment to keep from having the test rescheduled.  If you need to cancel or reschedule your appointment, please call the office within 24 hours of your appointment. . Patient verbalized understanding.EHK

## 2017-07-20 ENCOUNTER — Ambulatory Visit (HOSPITAL_COMMUNITY): Payer: Medicare Other | Attending: Cardiology

## 2017-07-20 VITALS — Ht 62.0 in | Wt 165.0 lb

## 2017-07-20 DIAGNOSIS — I10 Essential (primary) hypertension: Secondary | ICD-10-CM | POA: Diagnosis not present

## 2017-07-20 DIAGNOSIS — R079 Chest pain, unspecified: Secondary | ICD-10-CM | POA: Insufficient documentation

## 2017-07-20 DIAGNOSIS — R110 Nausea: Secondary | ICD-10-CM

## 2017-07-20 DIAGNOSIS — I25118 Atherosclerotic heart disease of native coronary artery with other forms of angina pectoris: Secondary | ICD-10-CM | POA: Diagnosis not present

## 2017-07-20 DIAGNOSIS — I2510 Atherosclerotic heart disease of native coronary artery without angina pectoris: Secondary | ICD-10-CM | POA: Insufficient documentation

## 2017-07-20 DIAGNOSIS — Z01818 Encounter for other preprocedural examination: Secondary | ICD-10-CM | POA: Diagnosis not present

## 2017-07-20 LAB — MYOCARDIAL PERFUSION IMAGING
CSEPPHR: 109 {beats}/min
LHR: 0.25
LVDIAVOL: 54 mL (ref 46–106)
LVSYSVOL: 15 mL
Rest HR: 65 {beats}/min
SDS: 5
SRS: 7
SSS: 12
TID: 1.15

## 2017-07-20 MED ORDER — TECHNETIUM TC 99M TETROFOSMIN IV KIT
10.8000 | PACK | Freq: Once | INTRAVENOUS | Status: AC | PRN
  Administered 2017-07-20: 10.8 via INTRAVENOUS
  Filled 2017-07-20: qty 11

## 2017-07-20 MED ORDER — AMINOPHYLLINE 25 MG/ML IV SOLN
75.0000 mg | Freq: Once | INTRAVENOUS | Status: AC
  Administered 2017-07-20: 75 mg via INTRAVENOUS

## 2017-07-20 MED ORDER — REGADENOSON 0.4 MG/5ML IV SOLN
0.4000 mg | Freq: Once | INTRAVENOUS | Status: AC
  Administered 2017-07-20: 0.4 mg via INTRAVENOUS

## 2017-07-20 MED ORDER — TECHNETIUM TC 99M TETROFOSMIN IV KIT
32.4000 | PACK | Freq: Once | INTRAVENOUS | Status: AC | PRN
  Administered 2017-07-20: 32.4 via INTRAVENOUS
  Filled 2017-07-20: qty 33

## 2017-07-21 ENCOUNTER — Telehealth: Payer: Self-pay | Admitting: Nurse Practitioner

## 2017-07-21 NOTE — Telephone Encounter (Signed)
Stress test was negative .   I suspect that this pain is non cardiac . Its been 6 months since her last PCI. I would prefer that she wait a year until she has her shoulder surgery and holds her Plavix but if the surgery is urgent, she may hold the Plavix for 5 days . She will need to continue the ASA 81 mg a day through the surgery  .

## 2017-07-21 NOTE — Telephone Encounter (Signed)
Reviewed Dr. Elmarie Shiley advice with patient who states she will discuss timing of surgery with her surgeon and her family. She verbalized understanding that surgeon's office will need to send [place] an official surgical clearance request. She thanked me for the call.

## 2017-07-21 NOTE — Telephone Encounter (Signed)
-----   Message from Thayer Headings, MD sent at 07/20/2017  4:15 PM EDT ----- Normal myoview

## 2017-07-21 NOTE — Telephone Encounter (Signed)
Stress test results reviewed with patient. She states she had pain in her left chest after completing the stress test yesterday but she felt better after laying down. She states she notices less pain since Dr. Acie Fredrickson increased her Isosorbide on 5/30. She denies chest pain or SOB with exertion or exercise. She asks if she can proceed with shoulder surgery since it has been 6 months since her stent placement. She states our office would not have received a clearance request yet because she was waiting to schedule surgery until after stress test. I advised that I will forward to Dr. Acie Fredrickson for review.

## 2017-08-04 DIAGNOSIS — R6884 Jaw pain: Secondary | ICD-10-CM | POA: Diagnosis not present

## 2017-08-04 DIAGNOSIS — H9201 Otalgia, right ear: Secondary | ICD-10-CM | POA: Diagnosis not present

## 2017-08-07 DIAGNOSIS — H43813 Vitreous degeneration, bilateral: Secondary | ICD-10-CM | POA: Diagnosis not present

## 2017-08-07 DIAGNOSIS — H43812 Vitreous degeneration, left eye: Secondary | ICD-10-CM | POA: Diagnosis not present

## 2017-08-07 DIAGNOSIS — H20013 Primary iridocyclitis, bilateral: Secondary | ICD-10-CM | POA: Diagnosis not present

## 2017-08-07 DIAGNOSIS — H35373 Puckering of macula, bilateral: Secondary | ICD-10-CM | POA: Diagnosis not present

## 2017-08-07 DIAGNOSIS — H43811 Vitreous degeneration, right eye: Secondary | ICD-10-CM | POA: Diagnosis not present

## 2017-08-11 DIAGNOSIS — H401113 Primary open-angle glaucoma, right eye, severe stage: Secondary | ICD-10-CM | POA: Diagnosis not present

## 2017-08-11 DIAGNOSIS — H401122 Primary open-angle glaucoma, left eye, moderate stage: Secondary | ICD-10-CM | POA: Diagnosis not present

## 2017-08-12 ENCOUNTER — Telehealth: Payer: Self-pay | Admitting: Cardiovascular Disease

## 2017-08-12 NOTE — Telephone Encounter (Signed)
Talked with pt and she does have a lot of shoulder pain.  She has new stent placed to coronary artery 01/27/17. Optimum therapy would be Plavix and ASA for 12 months.   She was instructed we would prefer she wait until Dec for surgery but if she cannot then she would be at higher risk for cardiac complications.  She is not taking the pain med so she will try this and try to hold off surgery until the end of August.  If she cannot wait she will call us back.  We would clear at that time if stable.  She recently had neg nuc for ischemia.  She has a follow up with Dr. Acie Fredrickson 10/05/17.     Will send this note to Dr French Ana

## 2017-08-12 NOTE — Telephone Encounter (Signed)
° °  [facility] Pre-operative Risk Assessment    Request for surgical clearance:  1. What type of surgery is being performed?  Right shoulder scope rotator cuff repair  2. When is this surgery scheduled? TBD  3. What type of clearance is required (medical clearance vs. Pharmacy clearance to hold med vs. Both)?  Both  4. Are there any medications that need to be held prior to surgery and how long? Plavix, Aspirin and how medications need to be held.   5. Practice name and name of physician performing surgery? [facility], Dr. Earlie Server  6. What is your office phone number [phone] ext [phone] [name]   7.   What is your office fax number [phone] Attn: [name]  8.   Anesthesia type (None, local, MAC, general) ?  Not listed   Sheila Reynolds 08/12/2017, 1:38 PM  _________________________________________________________________   (provider comments below)

## 2017-08-19 ENCOUNTER — Other Ambulatory Visit: Payer: Self-pay | Admitting: Physician Assistant

## 2017-08-19 DIAGNOSIS — M25511 Pain in right shoulder: Secondary | ICD-10-CM | POA: Diagnosis not present

## 2017-08-19 DIAGNOSIS — I2510 Atherosclerotic heart disease of native coronary artery without angina pectoris: Secondary | ICD-10-CM | POA: Diagnosis not present

## 2017-08-19 DIAGNOSIS — E1169 Type 2 diabetes mellitus with other specified complication: Secondary | ICD-10-CM | POA: Diagnosis not present

## 2017-08-19 DIAGNOSIS — I119 Hypertensive heart disease without heart failure: Secondary | ICD-10-CM | POA: Diagnosis not present

## 2017-08-26 ENCOUNTER — Other Ambulatory Visit: Payer: Self-pay

## 2017-08-26 NOTE — Patient Outreach (Signed)
[facility]) Care Management  08/26/2017  Sheila Reynolds Jun 23, 1945 [id number]   Telephone Screen  Referral Date: 08/26/17 Referral Source: MD office Referral Reason: " Glaucoma-patient is in doughnut hole, please help-especially with eye drops" Insurance: Medicare   Outreach attempt # 1 to patient. Spoke with patient and screening completed.   Social: Patient resides in her home along with her spouse. She is independent with ADLs/IADLs. She drives herself. She reports a slight fall in May were she tripped. She does have a cane in the home that she uses.    Conditions: Per chart review, patient has PMH of CAD, HLD, sarcoidosis, GERD, fibromyalgia, HTN, HLD, OSA(uses CPAP and diabetes. Patient voices that she is monitoring cbgs and adhering to diet regimen. She reports that her last A!C was 6.3. She states that it went up slightly from 6.0 due to her switching diabetic meds. She reports that her breathing is managed and controlled. She is using inhalers and nebs as needed. She denies any SOB. Patient aware of "triggers" and know how to avoid them and handle them.    Medications: Per patient report she is taking about 21 meds. She voices that she went into the doughnut hole last month. She reports that several of her meds are costly. Patient states she is having trouble affording all eye drops-Travatan, Lotemax, Alphagan, Refresh, Vigamox) ear gtts, inhalers-Dulera, Pro-Air. She reports that she is getting samples at times from MD for inhalers or otherwise she just goes without them She reports that MD recently switched her from [place] to [place]. She has been able to get samples of med from MD which has helped her out. She stets that her eye MD does not provide samples and this is where her greatest need for assistance is.    Appointments: Patient followed by PCP as well as Dr. Acie Fredrickson and Dr. Annamaria Boots.    Consent: [facility] services reviewed and discussed with patient.  Patient gave verbal consent for services. She denies any other needs other than pharmacy assistance.    Plan: RN CM will send [place] referral for polypharmacy med review and possible med assistance.   Enzo Montgomery, RN,BSN,CCM [place] [facility] Telephonic Care Management Coordinator Direct Phone: [phone] Toll Free: [phone] Fax: [phone]

## 2017-08-27 ENCOUNTER — Other Ambulatory Visit: Payer: Self-pay | Admitting: Pharmacist

## 2017-08-27 NOTE — Patient Outreach (Signed)
[facility]) Care Management  08/27/2017  Sheila Reynolds September 07, 1945 [id number]   72 year old female referred to [facility] by provider for medication assistance with eye drops.  Per notes, patient is in the coverage gap.  PMHx includes, but not limited to, T2DM, CAD, sarcoidosis, GERD, fibromyalgia, HTN, HLD, OSA, glaucoma and cataracts with recent cataract surgery on left eye in June 2019.    Subjective:  Successful call today to Sheila Reynolds.  Patient is agreeable to [facility] services.  HIPAA identifiers verified. Medication list reviewed with patient.  She is having trouble affording all her eye drops and inhalers.  She reports each eye drop costs ~$100 / fill.    Objective:  Medications Reviewed Today    Reviewed by Rudean Haskell, RPH (Pharmacist) on 08/27/17 at 1139  Med List Status: <None>  Medication Order Taking? Sig Documenting Provider Last Dose Status Informant  acetaminophen (TYLENOL) 500 MG tablet [id number] Yes Take 1,000 mg by mouth 2 (two) times daily as needed for moderate pain or headache. [provider] Taking Active Self  albuterol (PROVENTIL) (2.5 MG/3ML) 0.083% nebulizer solution [id number] Yes Take 2.5 mg by nebulization daily as needed for wheezing or shortness of breath.  [provider] Taking Active Self  aspirin EC 81 MG tablet [id number] Yes Take 81 mg by mouth at bedtime.  [provider] Taking Active Self  atorvastatin (LIPITOR) 40 MG tablet [id number] Yes Take 1 tablet (40 mg total) by mouth daily. Leanor Kail, PA Taking Active   brimonidine (ALPHAGAN P) 0.1 % SOLN [id number] Yes Place 1 drop into the left eye 3 (three) times daily.  [provider] Taking Active Self  brinzolamide (AZOPT) 1 % ophthalmic suspension [id number] Yes Place 1 drop into the left eye 3 (three) times daily.  [provider] Taking Active Self  carvedilol (COREG) 12.5 MG tablet [id number] Yes Take 1  tablet (12.5 mg total) by mouth 2 (two) times daily. [name], Wonda Cheng, MD Taking Active   clopidogrel (PLAVIX) 75 MG tablet [id number] Yes Take 1 tablet (75 mg total) by mouth daily. End, Harrell Gave, MD Taking Active Self  furosemide (LASIX) 20 MG tablet [id number] Yes TAKE 1 TABLET BY MOUTH EVERY 3 DAYS [name], Wonda Cheng, MD Taking Active   guaiFENesin (ROBITUSSIN) 100 MG/5ML SOLN [id number] Yes Take 5 mLs by mouth every 6 (six) hours as needed for cough or to loosen phlegm.  [provider] Taking Active Self  HYDROcodone-acetaminophen (NORCO/VICODIN) 5-325 MG tablet [id number] Yes Take 1 tablet by mouth at bedtime as needed.  [provider] Taking Active   isosorbide mononitrate (IMDUR) 60 MG 24 hr tablet [id number] Yes Take 1 tablet (60 mg total) by mouth daily. [name], Wonda Cheng, MD Taking Active   loteprednol (LOTEMAX) 0.5 % ophthalmic suspension [id number] Yes Place 3-4 drops into both eyes See admin instructions. Instill 1 drop into the left eye 6x daily, and 1 drops in the right eye 2x daily [provider] Taking Active Self  Menthol, Topical Analgesic, (BIOFREEZE EX) [id number] Yes Apply 1 application topically daily as needed (foot pain). [provider] Taking Active Self  mometasone-formoterol (DULERA) 200-5 MCG/ACT Hollie Salk [id number] Yes Inhale 2 puffs into the lungs as needed for wheezing. [provider] Taking Active   montelukast (SINGULAIR) 10 MG tablet [id number] Yes Take 10 mg by mouth at bedtime. [provider] Taking Active Self  nitroGLYCERIN (NITROSTAT) 0.4 MG SL tablet [id number] Yes Place 1 tablet (0.4 mg total)  under the tongue every 5 (five) minutes as needed for chest pain. End, Harrell Gave, MD Taking Active Self  olmesartan (BENICAR) 20 MG tablet [id number] Yes Take 20 mg by mouth daily. [provider] Taking Active Self           Med Note (Chiann Goffredo E   Thu Aug 27, 2017 11:00 AM)    pantoprazole (PROTONIX) 40 MG  tablet [id number] Yes TAKE 1 TABLET(40 MG) BY MOUTH DAILY Bhagat, Bhavinkumar, PA Taking Active   pregabalin (LYRICA) 75 MG capsule [id number] Yes Take 75 mg by mouth at bedtime. [provider] Taking Active Self  PROAIR HFA 108 [phone] Base) MCG/ACT inhaler [id number] No Inhale 2 puffs into the lungs every 6 (six) hours as needed for wheezing.  Patient not taking:  Reported on 08/27/2017   Deneise Lever, MD Not Taking Active Self  sitaGLIPtin (JANUVIA) 100 MG tablet [id number] Yes Take 100 mg by mouth daily. [provider] Taking Active   traMADol (ULTRAM) 50 MG tablet [id number] Yes Take 1 tablet (50 mg total) by mouth every 6 (six) hours as needed. Deneise Lever, MD Taking Active   travoprost, benzalkonium, (TRAVATAN) 0.004 % ophthalmic solution [id number] Yes Place 1 drop into both eyes at bedtime. [provider] Taking Active Self         Assessment:   Drugs sorted by system:  Cardiovascular: aspirin, atorvastatin carvedilol, clopidogrel, furosemide, isosorbide mononitrate, olmesartan, PRN NTG  Pulmonary/Allergy: albuterol, dulera, guaifenesin, montelukast  Gastrointestinal: pantoprazole  Endocrine: sitagliptin  Topical: brimonidine, brinzolamide, loteprednol, travoprost  Pain: acetaminophen, hydrocodone-APAP, topical menthol, pregabalin, tramadol  Medication management:  Several medications discontinued from profile as patient reports she is no longer taking: timolol gtts, vigamox gtts, toradol gtts, tinactin cream, janumet changed to [place]   Reviewed with patient correct dosing and administration of inhalers   Medication assistance: LIS: Not eligible per reported income from patient  PAPs: 1)Lotemax: Must have no prescription coverage for needed medication 2)Travatan: Novartis, no TROOP, meets income requirements 3)Azopt: [employer], no TROOP, meets income requirements 4)Alphagan - Allergan, no TROOP, meets income requirements 5)Dulera -  Merck, no TROOP, meets income requirements 6)Proventil - Merck, no TROOP, meets income requirements 7)Januvia - Merck, no TROOP, meets income requirements  Patient agreeable to apply for medications listed above, excluding lotemax as she does not qualify.   Plan: I will route patient assistance letter to pharmacy technician, Etter Sjogren, who will coordinate application process for medications as listed above.  She will assist with obtaining all pertinent documents from both patient and Dr. Lonia Skinner ([place]) and Dr. Glendale Chard and submit applications once completed.    Ralene Bathe, PharmD, [place] [phone]

## 2017-09-01 ENCOUNTER — Other Ambulatory Visit: Payer: Self-pay | Admitting: Pharmacy Technician

## 2017-09-01 NOTE — Patient Outreach (Signed)
[facility]) Care Management  09/02/2017  Sheila Reynolds 06/27/1945 [id number]   Received patient assistance referral from [place], faxed and mailed the following applications to patient and providers: Novartis (Travatan Z and Azopt) and Allergan(Alphagan P) faxed to Dr. Lonia Skinner, Merck (Ruthe Mannan, Proventil HFA and [place]) mailed to Dr. Baird Cancer.  Will follow up with patient in 7-10 business days to confirm applications have been received.  Maud Deed [place], [place] [facility] [phone]

## 2017-09-09 DIAGNOSIS — H35352 Cystoid macular degeneration, left eye: Secondary | ICD-10-CM | POA: Diagnosis not present

## 2017-09-17 DIAGNOSIS — I739 Peripheral vascular disease, unspecified: Secondary | ICD-10-CM | POA: Diagnosis not present

## 2017-09-17 DIAGNOSIS — R0602 Shortness of breath: Secondary | ICD-10-CM | POA: Diagnosis not present

## 2017-09-17 DIAGNOSIS — R609 Edema, unspecified: Secondary | ICD-10-CM | POA: Diagnosis not present

## 2017-09-17 DIAGNOSIS — M2550 Pain in unspecified joint: Secondary | ICD-10-CM | POA: Diagnosis not present

## 2017-09-18 DIAGNOSIS — H2013 Chronic iridocyclitis, bilateral: Secondary | ICD-10-CM | POA: Diagnosis not present

## 2017-09-18 DIAGNOSIS — H35352 Cystoid macular degeneration, left eye: Secondary | ICD-10-CM | POA: Diagnosis not present

## 2017-09-23 ENCOUNTER — Other Ambulatory Visit: Payer: Self-pay | Admitting: Pharmacist

## 2017-09-23 NOTE — Patient Outreach (Signed)
[facility]) Care Management  09/23/2017  [name] Nov 03, 1945 [id number]  Follow-up call to Ms. Hritz regarding medication assistance.  [facility] has not received patient assistance applications back in the mail (sent to patient 09/01/2017).    Patient reports she never received applications however she will be able to stop by the [facility] office today to pick up new applications.  [facility] pharmacy technician will get material ready for patient and follow-up with her afterwards to ensure applications are returned.   Ralene Bathe, PharmD, [place] [phone]

## 2017-09-24 ENCOUNTER — Other Ambulatory Visit: Payer: Self-pay | Admitting: Physician Assistant

## 2017-09-28 ENCOUNTER — Telehealth (HOSPITAL_COMMUNITY): Payer: Self-pay | Admitting: Surgery

## 2017-09-28 NOTE — Telephone Encounter (Signed)
Received an order from [employer] requesting an ABI and LE arterial duplex.    I called Mrs. Gabriel at [phone], left her a voicemail asking her to call Rip Harbour at [employer] to arrange the appointments.

## 2017-10-02 ENCOUNTER — Ambulatory Visit (HOSPITAL_COMMUNITY)
Admission: RE | Admit: 2017-10-02 | Discharge: 2017-10-02 | Disposition: A | Discharge status: Home / Self Care | Payer: Medicare Other | Source: Ambulatory Visit | Attending: Family | Admitting: Family

## 2017-10-02 ENCOUNTER — Other Ambulatory Visit (HOSPITAL_COMMUNITY): Payer: Self-pay | Admitting: Internal Medicine

## 2017-10-02 DIAGNOSIS — I10 Essential (primary) hypertension: Secondary | ICD-10-CM | POA: Insufficient documentation

## 2017-10-02 DIAGNOSIS — R600 Localized edema: Secondary | ICD-10-CM | POA: Diagnosis not present

## 2017-10-02 DIAGNOSIS — I739 Peripheral vascular disease, unspecified: Secondary | ICD-10-CM | POA: Diagnosis not present

## 2017-10-02 DIAGNOSIS — I2510 Atherosclerotic heart disease of native coronary artery without angina pectoris: Secondary | ICD-10-CM | POA: Diagnosis not present

## 2017-10-05 ENCOUNTER — Encounter: Payer: Self-pay | Admitting: Cardiovascular Disease

## 2017-10-05 ENCOUNTER — Ambulatory Visit (INDEPENDENT_AMBULATORY_CARE_PROVIDER_SITE_OTHER): Payer: Medicare Other | Admitting: Cardiovascular Disease

## 2017-10-05 VITALS — BP 140/78 | HR 82 | Ht 62.0 in | Wt 177.8 lb

## 2017-10-05 DIAGNOSIS — I2510 Atherosclerotic heart disease of native coronary artery without angina pectoris: Secondary | ICD-10-CM | POA: Diagnosis not present

## 2017-10-05 DIAGNOSIS — E782 Mixed hyperlipidemia: Secondary | ICD-10-CM | POA: Diagnosis not present

## 2017-10-05 MED ORDER — CLOPIDOGREL BISULFATE 75 MG PO TABS: 75.0000 mg | ORAL_TABLET | Freq: Every day | ORAL | 3 refills | Status: AC

## 2017-10-05 MED ORDER — CARVEDILOL 12.5 MG PO TABS: 12.5000 mg | ORAL_TABLET | Freq: Two times a day (BID) | ORAL | 3 refills | Status: DC

## 2017-10-05 NOTE — Patient Instructions (Signed)

## 2017-10-05 NOTE — Progress Notes (Signed)
Cardiology Office Note   Date:  10/05/2017   ID:  Sheila Reynolds, Sheila Reynolds [birth year], MRN [id number]  PCP:  Glendale Chard, MD  Cardiologist:   Mertie Moores, MD   Chief Complaint  Patient presents with  . Coronary Artery Disease   1. Hyperlipidemia 2.  CAD -  orbital atherectomy and PCI using Xience Sierra 3.5 x 33 mm DES to prox / mid LAD  3. Nonsustained ventricular tachycardia 4. Mild to moderate coronary artery disease by cath in 2009. 5. 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 Darrick Penna is a 72 y.o. female with the above noted hx. She has continued to have some palpitations. She has occasional episodes of lightheadedness when she has palpitations. Her main issue has been lots of total body cramps. She has been seen in the emergency room. Her lab work looked fine.  She's had lots of dyspnea especially with exertion. She has a history of sarcoidosis and is followed by Dr. Annamaria Boots.  March 11, 2012: Sheila Reynolds has had more palpitations recently. It Appears that her metoprolol dose was decreased slightly. We placed an event monitor on her. She did not have any episodes of atrial fibrillation. She has occasional drinker atrial contractions and occasional premature ventricular contractions. There were no life-threatening arrhythmias.  She is doing well from a cardiac standpoint. She's not had any episodes of syncope. She has significant shortness of breath especially when climbing stairs. This is likely due to her sarcoidosis. She denies any chest pain. Her BP is a bit elevated. She avoids salt.   May 02, 2013 :  She presented to see Richardson Dopp in February with some increased palpitations and lightheadedness. He recommended that she decrease her caffeine intake. He placed a 21 day event monitor and instructed her to take an extra metoprolol as needed.  The palpitations can occur at any time. They occur with rest and with exertion. They're not constant. The last  30-45 seconds and occur multiple times through the day.  Sept. 16, 2015:  Still having palpitations. She has had more leg edema and her medical doctor on Lasix which he now takes about every other day. She was not on potassium replacement. This may be the cause of her increased PVCs.   May 24, 2014:  Sheila Reynolds is a 72 y.o. female who presents for follow up of her palpitations   Still has these palpitations.  No syncope.   Jan. 10, 2017:  Doing ok Still has DOE  Has palpitations on occasion. Not getting much exercise .  expalined a typical exercise program .   Start slow, gradually increase   May 07, 2016;   Sheila Reynolds is seen today for follow up .  Has been having more palpitations Seem to be getting worse  Went to there ER last week -  the ER note does not describe any specific arrhythmias. No passing out but she did get lightheaded when she had palpitations on one occasion   Has rare episodes of atypical CP .  Lasts a second Not associated with exertion .  Walks 1.5 miles 3-4 times a week .   This walking does not cause any palpitation of CP   July 17, 2016 Sheila Reynolds is seen today for follow up  30 day monitor showed no significant arrhythmias  Having leg pain and swelling  during night and day  Not worsened by exercise  No CP or dyspnea - still walking 1.5 miles 3-4 days a  week   December 22, 2016:  Sheila Reynolds had a cath recently that showed a 70 % LAD stenosis . Has right arm pain but this is not exertional . Has pins and needles sensation Has DOE with stairs and carrying something heavy .  Has not really noticed any difference in how she feels on the new meds which include Imdur 30 mg , Plavix 75 ,  + cough, chronic ,  No hemoptysis. Does not exercise regularly .    January 29, 2017: Sheila Reynolds is seen today for a follow-up visit.  She recently had rotational atherectomy and stenting of her proximal LAD and mid LAD. Still has some pins and needles  sensation Has been walking in the mall.  Does not have CP . Has some DOE with that   .  Jul 09, 2017:   Sheila Reynolds is doing well.  She is status post rotational atherectomy and stent placement to her proximal/mid LAD in December, 2018. Had left eye surgery in left eye.    Occasional CP ,  Thinks it might be indigestion .  Not associated with eating or drinking . Pain was on the left side of left breast.   Lasts a few minutes Did not have much pain prior to her PCI to LAD .  Has been exercising .   Exercise does not cause any CP .  Relieved with SL NTG   BP has been elevated.   Aug. 26, 2019: Following her visit in February, she was having some chest pain.  A stress Myoview study was performed and was found to be low risk.  She has normal left ventricular systolic function and no ischemia. She is feeling quite well.  She has not had any recurrent episodes of chest pain. She is exercising some .   Has been having bilateral leg pain and swelling.  She had an arterial Doppler performed for peripheral vascular disease.  As of today the study has not yet been read.  Past Medical History:  Diagnosis Date  . Anemia    3 months ago anemic  . Anxiety    on meds  . Arthritis    "all over" (01/15/2017)  . Asthma   . Bronchitis with emphysema   . Chest pain   . Chronic bronchitis ([place])   . Coronary artery disease    a. 01/2017 she underwent orbital atherectomy/DES to the proxmal LAD and PTCA to ostial D2. 2D Echo 01/15/17 showed mild LVH, EF 60-65%, grade 1 DD.  . Family history of anesthesia complication    daughter N/V  . Fibromyalgia   . GERD (gastroesophageal reflux disease)    on meds  . Heart murmur   . History of hiatal hernia   . Hx of echocardiogram    Echo (03/2013):  Tech limited; Mild focal basal septal hypertrophy, EF 60-65%, normal RVF  . Hyperlipidemia   . Hypertension   . Nonsustained ventricular tachycardia ([place])   . OSA on CPAP   . Premature atrial contractions   .  PVC (premature ventricular contraction)    a. Holter 12/16: NSR, occ PAC,PVCs  . Sarcoidosis   . Type II diabetes mellitus ([place])     Past Surgical History:  Procedure Laterality Date  . BREAST SURGERY    . CARDIAC CATHETERIZATION  05/04/2007   reveals overall normal left ventricular systolic function. Ejection fraction 65-70%  . CATARACT EXTRACTION W/PHACO Right 07/20/2013   Procedure: CATARACT EXTRACTION PHACO AND INTRAOCULAR LENS PLACEMENT (IOC);  Surgeon: Marylynn Pearson,  MD;  Location: [facility];  Service: Ophthalmology;  Laterality: Right;  . COLONOSCOPY W/ BIOPSIES AND POLYPECTOMY    . COLONOSCOPY WITH PROPOFOL N/A 09/07/2014   Procedure: COLONOSCOPY WITH PROPOFOL;  Surgeon: Juanita Craver, MD;  Location: [facility];  Service: Endoscopy;  Laterality: N/A;  . CORONARY ANGIOPLASTY WITH STENT PLACEMENT  01/15/2017  . CORONARY ATHERECTOMY N/A 01/15/2017   Procedure: CORONARY ATHERECTOMY;  Surgeon: Nelva Bush, MD;  Location: [place] [facility];  Service: Cardiovascular;  Laterality: N/A;  . CORONARY BALLOON ANGIOPLASTY N/A 01/15/2017   Procedure: CORONARY BALLOON ANGIOPLASTY;  Surgeon: Nelva Bush, MD;  Location: [facility];  Service: Cardiovascular;  Laterality: N/A;  . CORONARY STENT INTERVENTION N/A 01/15/2017   Procedure: CORONARY STENT INTERVENTION;  Surgeon: Nelva Bush, MD;  Location: [facility];  Service: Cardiovascular;  Laterality: N/A;  . ESOPHAGOGASTRODUODENOSCOPY (EGD) WITH PROPOFOL N/A 09/07/2014   Procedure: ESOPHAGOGASTRODUODENOSCOPY (EGD) WITH PROPOFOL;  Surgeon: Juanita Craver, MD;  Location: [facility];  Service: Endoscopy;  Laterality: N/A;  . EXTERNAL EAR SURGERY Bilateral 1970s   tumors removed  . EYE SURGERY    . INTRAVASCULAR PRESSURE WIRE/FFR STUDY N/A 12/12/2016   Procedure: INTRAVASCULAR PRESSURE WIRE/FFR STUDY;  Surgeon: Nelva Bush, MD;  Location: [place] [facility];  Service: Cardiovascular;  Laterality: N/A;  . MINI SHUNT INSERTION  Right 07/20/2013   Procedure: INSERTION OF GLAUCOMA FILTRATION DEVICE RIGHT EYE;  Surgeon: Marylynn Pearson, MD;  Location: [place];  Service: Ophthalmology;  Laterality: Right;  . MITOMYCIN C APPLICATION Right 06/15/3974   Procedure: MITOMYCIN C APPLICATION;  Surgeon: Marylynn Pearson, MD;  Location: [place];  Service: Ophthalmology;  Laterality: Right;  . MITOMYCIN C APPLICATION Right 7/34/1937   Procedure: MITOMYCIN C APPLICATION RIGHT EYE;  Surgeon: Marylynn Pearson, MD;  Location: [place];  Service: Ophthalmology;  Laterality: Right;  . PLACEMENT OF BREAST IMPLANTS Bilateral 1992   "took all my breast tissue out; put implants in;fibrocystic breast disease "  . RIGHT/LEFT HEART CATH AND CORONARY ANGIOGRAPHY N/A 12/12/2016   Procedure: RIGHT/LEFT HEART CATH AND CORONARY ANGIOGRAPHY;  Surgeon: Nelva Bush, MD;  Location: [place] [facility];  Service: Cardiovascular;  Laterality: N/A;  . TONSILLECTOMY    . TOTAL ABDOMINAL HYSTERECTOMY  1982  . TRABECULECTOMY Right 02/21/2015   Procedure: TRABECULECTOMY WITH [facility] ON THE RIGHT EYE;  Surgeon: Marylynn Pearson, MD;  Location: [place];  Service: Ophthalmology;  Laterality: Right;     Current Outpatient Medications  Medication Sig Dispense Refill  . acetaminophen (TYLENOL) 500 MG tablet Take 1,000 mg by mouth 2 (two) times daily as needed for moderate pain or headache.    . albuterol (PROVENTIL) (2.5 MG/3ML) 0.083% nebulizer solution Take 2.5 mg by nebulization daily as needed for wheezing or shortness of breath.     [name] aspirin EC 81 MG tablet Take 81 mg by mouth at bedtime.     [name] atorvastatin (LIPITOR) 40 MG tablet TAKE 1 TABLET(40 MG) BY MOUTH DAILY 30 tablet 8  . brimonidine (ALPHAGAN P) 0.1 % SOLN Place 1 drop into the left eye 3 (three) times daily.     . brinzolamide (AZOPT) 1 % ophthalmic suspension Place 1 drop into the left eye 3 (three) times daily.     . carvedilol (COREG) 12.5 MG tablet Take 1 tablet (12.5 mg total) by mouth 2 (two) times daily. 180 tablet 3    . clopidogrel (PLAVIX) 75 MG tablet Take 1 tablet (75 mg total) by mouth daily. 30 tablet 11  .  furosemide (LASIX) 20 MG tablet TAKE 1 TABLET BY MOUTH EVERY 3 DAYS 45 tablet 3  . guaiFENesin (ROBITUSSIN) 100 MG/5ML SOLN Take 5 mLs by mouth every 6 (six) hours as needed for cough or to loosen phlegm.     [name] HYDROcodone-acetaminophen (NORCO/VICODIN) 5-325 MG tablet Take 1 tablet by mouth at bedtime as needed.     . isosorbide mononitrate (IMDUR) 60 MG 24 hr tablet Take 1 tablet (60 mg total) by mouth daily. 90 tablet 3  . loteprednol (LOTEMAX) 0.5 % ophthalmic suspension See admin instructions. Instill 8 DROPS IN LEFT EYE 3 IN RIGHT EYE DAILY    . Menthol, Topical Analgesic, (BIOFREEZE EX) Apply 1 application topically daily as needed (foot pain).    . mometasone-formoterol (DULERA) 200-5 MCG/ACT AERO Inhale 2 puffs into the lungs as needed for wheezing.    . montelukast (SINGULAIR) 10 MG tablet Take 10 mg by mouth at bedtime.    . nitroGLYCERIN (NITROSTAT) 0.4 MG SL tablet Place 1 tablet (0.4 mg total) under the tongue every 5 (five) minutes as needed for chest pain. 25 tablet prn  . olmesartan (BENICAR) 20 MG tablet Take 20 mg by mouth daily.    . pantoprazole (PROTONIX) 40 MG tablet TAKE 1 TABLET(40 MG) BY MOUTH DAILY 30 tablet 9  . Pregabalin ER (LYRICA CR) 165 MG TB24 Take 1 tablet by mouth at bedtime.    [name] PROAIR HFA 108 (90 Base) MCG/ACT inhaler Inhale 2 puffs into the lungs every 6 (six) hours as needed for wheezing. 1 Inhaler 12  . sitaGLIPtin (JANUVIA) 100 MG tablet Take 100 mg by mouth daily.    . traMADol (ULTRAM) 50 MG tablet Take 1 tablet (50 mg total) by mouth every 6 (six) hours as needed. 40 tablet 0  . travoprost, benzalkonium, (TRAVATAN) 0.004 % ophthalmic solution Place 1 drop into the right eye at bedtime.      No current facility-administered medications for this visit.     Allergies:   Crestor [rosuvastatin calcium]; Demerol  [meperidine hcl]; Shellfish allergy; and Demerol  [meperidine]    Social History:  The patient  reports that she quit smoking about 37 years ago. Her smoking use included cigarettes. She has a 4.00 pack-year smoking history. She has never used smokeless tobacco. She reports that she does not drink alcohol or use drugs.   Family History:  The patient's family history includes Cancer in her mother and paternal grandmother; Diabetes in her brother and father; Glaucoma in her brother and father; Heart disease in her father and paternal grandmother.    ROS: Noted in current history, otherwise review of systems is negative.  Physical Exam: Blood pressure 140/78, pulse 82, height 5\' 2"  (1.575 m), weight 177 lb 12.8 oz (80.6 kg), SpO2 97 %.  GEN:  Well nourished, well developed in no acute distress HEENT: Normal NECK: No JVD; No carotid bruits LYMPHATICS: No lymphadenopathy CARDIAC: RRR, no murmurs, rubs, gallops RESPIRATORY:  Clear to auscultation without rales, wheezing or rhonchi  ABDOMEN: Soft, non-tender, non-distended MUSCULOSKELETAL:  No edema; No deformity  SKIN: Warm and dry NEUROLOGIC:  Alert and oriented x 3   EKG:     Recent Labs: 03/23/2017: Hemoglobin 11.8; Platelets 196 03/31/2017: TSH 1.100 04/09/2017: ALT 13; BUN 12; Creatinine, Ser 0.81; Potassium 4.3; Sodium 138 04/20/2017: Magnesium 2.5    Lipid Panel    Component Value Date/Time   CHOL 129 04/09/2017 1107   TRIG 87 04/09/2017 1107   HDL 44 04/09/2017 1107  CHOLHDL 2.9 04/09/2017 1107   CHOLHDL 3 05/24/2014 1650   VLDL 20.8 05/24/2014 1650   LDLCALC 68 04/09/2017 1107      Wt Readings from Last 3 Encounters:  10/05/17 177 lb 12.8 oz (80.6 kg)  07/20/17 165 lb (74.8 kg)  07/09/17 165 lb 6.4 oz (75 kg)      Other studies Reviewed: Additional studies/ records that were reviewed today include: . Review of the above records demonstrates:    ASSESSMENT AND PLAN:   1.   Coronary artery disease:     Well for most coronary artery disease standpoint.   She has not had any angina.  She has some shoulder problems and her shoulder has been hurting quite a bit recently.  She was hoping to be able to wait till the end of this year but she does not think that she can wait for surgery.  In more than 6 months that she had her stent placed.  There is lots of data to suggest that 6 months of aspirin Plavix is adequate for the new generation stents.  I will go ahead and give clearance for her to have shoulder surgery.  She is at low risk for her surgery.  She may hold the Plavix for 5 days prior to surgery.  I would prefer that she continue aspirin through the surgical time.  2. Hyperlipidemia -  .    Labs from Dr. Baird Cancer office look good.  We will see her again in 6 months and check labs at that time.  4. Nonsustained ventricular tachycardia-    5.  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 Keturah Barre   Current medicines are reviewed at length with the patient today.  The patient does not have concerns regarding medicines.  The following changes have been made:  no change  Labs/ tests ordered today include:   No orders of the defined types were placed in this encounter.    Disposition:   FU with me in 6 months   Signed, Mertie Moores, MD  10/05/2017 12:05 PM    [address], [place], [place]  [address] Phone: [phone]; Fax: [phone]

## 2017-10-05 NOTE — Telephone Encounter (Signed)
Pt presented today for follow up  She has been having more and more Right shoulder pain  She has been evaluated by the preop clinic and advice was to try to hold off until December, 2019.  It is increased and she is not able to hold off from having surgery.  Has been more than 6 months since her stent placement and there data to suggest that 6 months of DAPT is sufficient   Ms Zellars is at low risk for her upcoming shoulder surgery She may hold her Plavix for 5 days prior to surgery. I would prefer that she continue ASA 81 mg a day.    Mertie Moores, MD  10/05/2017 12:28 PM    [address],  [place], [place]  [address] Pager [phone] Phone: [phone]; Fax: [phone]

## 2017-10-09 DIAGNOSIS — H35373 Puckering of macula, bilateral: Secondary | ICD-10-CM | POA: Diagnosis not present

## 2017-10-09 DIAGNOSIS — H43813 Vitreous degeneration, bilateral: Secondary | ICD-10-CM | POA: Diagnosis not present

## 2017-10-09 DIAGNOSIS — H35352 Cystoid macular degeneration, left eye: Secondary | ICD-10-CM | POA: Diagnosis not present

## 2017-10-09 DIAGNOSIS — H20013 Primary iridocyclitis, bilateral: Secondary | ICD-10-CM | POA: Diagnosis not present

## 2017-10-09 DIAGNOSIS — H3581 Retinal edema: Secondary | ICD-10-CM | POA: Diagnosis not present

## 2017-10-15 DIAGNOSIS — M75101 Unspecified rotator cuff tear or rupture of right shoulder, not specified as traumatic: Secondary | ICD-10-CM | POA: Diagnosis not present

## 2017-10-15 DIAGNOSIS — M2550 Pain in unspecified joint: Secondary | ICD-10-CM | POA: Diagnosis not present

## 2017-10-15 DIAGNOSIS — G894 Chronic pain syndrome: Secondary | ICD-10-CM | POA: Diagnosis not present

## 2017-11-04 ENCOUNTER — Ambulatory Visit: Payer: Self-pay | Admitting: Pharmacist

## 2017-11-04 ENCOUNTER — Other Ambulatory Visit: Payer: Self-pay | Admitting: Pharmacist

## 2017-11-04 DIAGNOSIS — M75121 Complete rotator cuff tear or rupture of right shoulder, not specified as traumatic: Secondary | ICD-10-CM | POA: Diagnosis not present

## 2017-11-04 NOTE — Patient Outreach (Signed)
[facility]) Care Management  11/04/2017  Sheila Reynolds 26-Aug-1945 [id number]  72 y.o. year old female referred to [facility] for medication assistance.  Follow-up call to patient regarding PAP applications as they have not been returned yet to [facility].  Patient reports she decided to not complete applications as she did not think she would be eligible. We reviewed [employer] income requirements and patient now thinks she might still be eligible.   She will wait until 2020 to apply as she may only be approved for a short supply at this point with application process taking at least 6 weeks.  I provided my name and phone number for patient to contact next year for assistance.   [facility] pharmacy case is being closed due to the following reasons:  The patient has chosen to discontinue services.  Patient has been provided [facility] CM contact information if assistance needed in the future.    Ralene Bathe, PharmD, [place] [phone]

## 2017-11-09 DIAGNOSIS — H35352 Cystoid macular degeneration, left eye: Secondary | ICD-10-CM | POA: Diagnosis not present

## 2017-11-09 DIAGNOSIS — H401122 Primary open-angle glaucoma, left eye, moderate stage: Secondary | ICD-10-CM | POA: Diagnosis not present

## 2017-11-09 DIAGNOSIS — H401113 Primary open-angle glaucoma, right eye, severe stage: Secondary | ICD-10-CM | POA: Diagnosis not present

## 2017-11-19 NOTE — Progress Notes (Signed)
Office Visit Note  Patient: Sheila Reynolds             Date of Birth: Oct 06, 1945           MRN: [id number]             PCP: Glendale Chard, MD Referring: Glendale Chard, MD Visit Date: 12/02/2017 Occupation: @GUAROCC @  Subjective:  Pain in both knee joints   History of Present Illness: Sheila Reynolds is a 72 y.o. female with history of sarcoidosis, uveitis, DDD, and fibromyalgia.  She reports she is currently having a glaucoma flare in both eyes.  She states she has been having more frequent flares since after having a cataract extraction.  She continues to follow up with her ophthalmologist and retina specialist regularly.  She recently was advised to increase the dose of Lotemax. Patient continues to see Dr. Annamaria Boots on a regular basis.  She states everything has been stable.  She continues to have a chronic productive cough, and she will be following up with him in November 2019.   She states she has a right rotator cuff tear, and she is planning surgery to be performed by Dr. Tamera Punt.  She states she has pain and intermittent knee joint swelling. She has been having worsening neck pain and stiffness. She reports she has intermittent hand pain and swelling.   Activities of Daily Living:  Patient reports morning stiffness for 30-45  minutes.   Patient Denies nocturnal pain.  Difficulty dressing/grooming: Denies Difficulty climbing stairs: Reports Difficulty getting out of chair: Reports Difficulty using hands for taps, buttons, cutlery, and/or writing: Reports  Review of Systems  Constitutional: Positive for fatigue.  HENT: Positive for mouth dryness. Negative for mouth sores and nose dryness.   Eyes: Positive for pain, redness and dryness. Negative for visual disturbance.  Respiratory: Positive for cough. Negative for hemoptysis, shortness of breath and difficulty breathing.   Cardiovascular: Positive for palpitations. Negative for chest pain, hypertension and swelling in  legs/feet.  Gastrointestinal: Negative for blood in stool, constipation and diarrhea.  Endocrine: Negative for increased urination.  Genitourinary: Negative for painful urination.  Musculoskeletal: Positive for arthralgias, joint pain, joint swelling and morning stiffness. Negative for myalgias, muscle weakness, muscle tenderness and myalgias.  Skin: Negative for color change, pallor, rash, hair loss, nodules/bumps, skin tightness, ulcers and sensitivity to sunlight.  Allergic/Immunologic: Negative for susceptible to infections.  Neurological: Negative for dizziness, numbness, headaches and weakness.  Hematological: Negative for swollen glands.  Psychiatric/Behavioral: Negative for depressed mood and sleep disturbance. The patient is not nervous/anxious.     PMFS History:  Patient Active Problem List   Diagnosis Date Noted  . 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 Fibromyalgia 06/01/2017  . DDD (degenerative disc disease), lumbar 06/01/2017  . 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 History of hypercholesterolemia 06/01/2017  . History of anxiety 06/01/2017  . History of IBS 06/01/2017  . Premature atrial contractions 03/30/2017  . PVC's (premature ventricular contractions) 03/30/2017  . Diabetes mellitus without complication ([place]) 96/78/9381  . Angina pectoris ([place]) 01/15/2017  . CAD (coronary artery disease) 01/12/2017  . Shortness of breath 12/12/2016  . Acute maxillary sinusitis 05/03/2014  . Obstructive sleep apnea 11/04/2013  . Multiple lung nodules 07/18/2012  . Heart palpitations 03/11/2012  . HTN (hypertension) 03/11/2012  . GERD (gastroesophageal reflux disease) 07/13/2011  . Sarcoidosis 02/08/2011  . Chronic bronchitis ([place]) 02/05/2011  . Hyperlipemia 09/24/2010  . Atypical chest pain 09/24/2010  . NSVT (nonsustained ventricular tachycardia) ([place])  09/24/2010  . Leg edema 09/24/2010    Past Medical History:  Diagnosis Date  . Anemia    3 months ago anemic  .  Anxiety    on meds  . Arthritis    "all over" (01/15/2017)  . Asthma   . Bronchitis with emphysema   . Chest pain   . Chronic bronchitis ([place])   . Coronary artery disease    a. 01/2017 she underwent orbital atherectomy/DES to the proxmal LAD and PTCA to ostial D2. 2D Echo 01/15/17 showed mild LVH, EF 60-65%, grade 1 DD.  . Family history of anesthesia complication    daughter N/V  . Fibromyalgia   . GERD (gastroesophageal reflux disease)    on meds  . Heart murmur   . History of hiatal hernia   . Hx of echocardiogram    Echo (03/2013):  Tech limited; Mild focal basal septal hypertrophy, EF 60-65%, normal RVF  . Hyperlipidemia   . Hypertension   . Nonsustained ventricular tachycardia ([place])   . OSA on CPAP   . Premature atrial contractions   . PVC (premature ventricular contraction)    a. Holter 12/16: NSR, occ PAC,PVCs  . Sarcoidosis   . Type II diabetes mellitus (HCC)     Family History  Problem Relation Age of Onset  . Heart disease Father   . Diabetes Father   . Glaucoma Father   . Cancer Mother        unknown type, ?lung  . Heart disease Paternal Grandmother   . Cancer Paternal Grandmother        unknown type  . Diabetes Brother   . Glaucoma Brother    Past Surgical History:  Procedure Laterality Date  . BREAST SURGERY    . CARDIAC CATHETERIZATION  05/04/2007   reveals overall normal left ventricular systolic function. Ejection fraction 65-70%  . CATARACT EXTRACTION W/PHACO Right 07/20/2013   Procedure: CATARACT EXTRACTION PHACO AND INTRAOCULAR LENS PLACEMENT (IOC);  Surgeon: Marylynn Pearson, MD;  Location: [place];  Service: Ophthalmology;  Laterality: Right;  . COLONOSCOPY W/ BIOPSIES AND POLYPECTOMY    . COLONOSCOPY WITH PROPOFOL N/A 09/07/2014   Procedure: COLONOSCOPY WITH PROPOFOL;  Surgeon: Juanita Craver, MD;  Location: [facility];  Service: Endoscopy;  Laterality: N/A;  . CORONARY ANGIOPLASTY WITH STENT PLACEMENT  01/15/2017  . CORONARY ATHERECTOMY N/A 01/15/2017     Procedure: CORONARY ATHERECTOMY;  Surgeon: Nelva Bush, MD;  Location: [place] [facility];  Service: Cardiovascular;  Laterality: N/A;  . CORONARY BALLOON ANGIOPLASTY N/A 01/15/2017   Procedure: CORONARY BALLOON ANGIOPLASTY;  Surgeon: Nelva Bush, MD;  Location: [place] [facility];  Service: Cardiovascular;  Laterality: N/A;  . CORONARY STENT INTERVENTION N/A 01/15/2017   Procedure: CORONARY STENT INTERVENTION;  Surgeon: Nelva Bush, MD;  Location: [place] [facility];  Service: Cardiovascular;  Laterality: N/A;  . ESOPHAGOGASTRODUODENOSCOPY (EGD) WITH PROPOFOL N/A 09/07/2014   Procedure: ESOPHAGOGASTRODUODENOSCOPY (EGD) WITH PROPOFOL;  Surgeon: Juanita Craver, MD;  Location: [facility];  Service: Endoscopy;  Laterality: N/A;  . EXTERNAL EAR SURGERY Bilateral 1970s   tumors removed  . EYE SURGERY Left 2019   cataract extraction   . INTRAVASCULAR PRESSURE WIRE/FFR STUDY N/A 12/12/2016   Procedure: INTRAVASCULAR PRESSURE WIRE/FFR STUDY;  Surgeon: Nelva Bush, MD;  Location: [place] [facility];  Service: Cardiovascular;  Laterality: N/A;  . MINI SHUNT INSERTION Right 07/20/2013   Procedure: INSERTION OF GLAUCOMA FILTRATION DEVICE RIGHT EYE;  Surgeon: Marylynn Pearson, MD;  Location: [place];  Service: Ophthalmology;  Laterality: Right;  . MITOMYCIN C APPLICATION Right 02/29/4172   Procedure: MITOMYCIN C APPLICATION;  Surgeon: Marylynn Pearson, MD;  Location: [place];  Service: Ophthalmology;  Laterality: Right;  . MITOMYCIN C APPLICATION Right 0/81/4481   Procedure: MITOMYCIN C APPLICATION RIGHT EYE;  Surgeon: Marylynn Pearson, MD;  Location: [place];  Service: Ophthalmology;  Laterality: Right;  . PLACEMENT OF BREAST IMPLANTS Bilateral 1992   "took all my breast tissue out; put implants in;fibrocystic breast disease "  . RIGHT/LEFT HEART CATH AND CORONARY ANGIOGRAPHY N/A 12/12/2016   Procedure: RIGHT/LEFT HEART CATH AND CORONARY ANGIOGRAPHY;  Surgeon: Nelva Bush, MD;  Location: [place] [facility];  Service: Cardiovascular;  Laterality: N/A;  . TONSILLECTOMY    . TOTAL ABDOMINAL HYSTERECTOMY  1982  . TRABECULECTOMY Right 02/21/2015   Procedure: TRABECULECTOMY WITH [facility] ON THE RIGHT EYE;  Surgeon: Marylynn Pearson, MD;  Location: [place];  Service: Ophthalmology;  Laterality: Right;   Social History   Social History Narrative  . Not on file     Objective: Vital Signs: BP 137/79 (BP Location: Left Arm, Patient Position: Sitting, Cuff Size: Normal)   Pulse 68   Resp 13   Ht 5\' 2"  (1.575 m)   Wt 179 lb 3.2 oz (81.3 kg)   BMI 32.78 kg/m    Physical Exam  Constitutional: She is oriented to person, place, and time. She appears well-developed and well-nourished.  HENT:  Head: Normocephalic and atraumatic.  Eyes: Conjunctivae and EOM are normal.  Neck: Normal range of motion.  Cardiovascular: Normal rate, regular rhythm, normal heart sounds and intact distal pulses.  Pulmonary/Chest: Effort normal and breath sounds normal.  Abdominal: Soft. Bowel sounds are normal.  Lymphadenopathy:    She has no cervical adenopathy.  Neurological: She is alert and oriented to person, place, and time.  Skin: Skin is warm and dry. Capillary refill takes less than 2 seconds.  Psychiatric: She has a normal mood and affect. Her behavior is normal.  Nursing note and vitals reviewed.    Musculoskeletal Exam: C-spine limited ROM with discomfort.  Thoracic and lumbar spine good ROM.  No midline spinal tenderness.  No SI joint tenderness.  Shoulder joints, elbow joints, wrist joints, MCPs, PIPs, and DIPs good ROM with no synovitis. Hip joints, knee joints, ankle joints, MTPs, PIPs ,and DIPs good ROM with no synovitis.  No warmth or effusion of knee joints.  She has pedal edema bilaterally.   CDAI Exam: CDAI Score: Not documented Patient Global Assessment: Not documented; Provider Global Assessment: Not documented Swollen: Not documented; Tender: Not documented Joint Exam   Not documented   There is  currently no information documented on the homunculus. Go to the Rheumatology activity and complete the homunculus joint exam.  Investigation: No additional findings.  Imaging: No results found.  Recent Labs: Lab Results  Component Value Date   WBC 7.2 03/23/2017   HGB 11.8 (L) 03/23/2017   PLT 196 03/23/2017   NA 138 04/09/2017   K 4.3 04/09/2017   CL 101 04/09/2017   CO2 24 04/09/2017   GLUCOSE 119 (H) 04/09/2017   BUN 12 04/09/2017   CREATININE 0.81 04/09/2017   BILITOT 0.3 04/09/2017   ALKPHOS 130 (H) 04/09/2017   AST 16 04/09/2017   ALT 13 04/09/2017   PROT 6.4 04/09/2017   ALBUMIN 4.3 04/09/2017   CALCIUM 9.2 04/09/2017   GFRAA 86 04/09/2017    Speciality Comments: No specialty comments available.  Procedures:  No procedures performed Allergies: Crestor [  rosuvastatin calcium]; Demerol  [meperidine hcl]; Shellfish allergy; and Demerol [meperidine]    According to Dr. Janee Morn office visit note from 06/21/17 sarcoidosis is clinically inactive but there is residual scarring.  Dr. Ander Slade, reviewed notes in care everywhere.  She is currently being treated for bilateral glaucoma.  She recently    Assessment / Plan:     Visit Diagnoses: Sarcoidosis - She was diagnosed with sarcoidosis several years ago. She was previously treatment with MTX, and she was doing well on methotrexate, so she discontinued 2014. She continues to follow up with Dr. Annamaria Boots on a regular basis.  According to Dr. Janee Morn most recent note on 06/21/17 sarcoidosis is clinically inactive but there is residual scarring.  She has a chronic productive cough.  She will be following up with Dr. Annamaria Boots on 12/21/17.   Uveitis - After reviewing Dr. Duayne Cal notes, she is being treated for primary open angle glaucoma of bilateral eyes.  According to Dr. Duayne Cal note on 11/09/17 she also has chronic iritis of both eyes, and she is followed by Dr. Randell Loop.  She was advised to use Lotemax BID for right eye and 6 times daily  for left eye.   Per patient it was recommended that she increase to Lotemax 8 times daily in left eye and 3 times daily in right eye.  We will try to contact Dr. Randell Loop to further clarify her diagnose and treatment options.  She was previously on MTX.   Primary open angle glaucoma of left eye, severe stage: She is being followed by Dr. Ander Slade.  Primary open angle glaucoma of right eye, moderate stage: She is being followed by Dr. Ander Slade.  DDD (degenerative disc disease), cervical: She has been having increased neck pain and stiffness.  She has limited ROM with lateral rotation.  She has no symptoms of radiculopathy at this time.   DDD (degenerative disc disease), lumbar: No midline spinal tenderness.    DDD (degenerative disc disease), thoracic: No midline spinal tenderness.   Fibromyalgia: She continues to have generalized muscle aches and muscle tenderness.  She has chronic fatigue. She is on Lyrica 165 mg 1 tablet daily. She takes tramadol 50 mg 1 tablet by mouth PRN.   Other medical conditions are listed as follows:   History of gastroesophageal reflux (GERD)  History of diabetes mellitus  History of hypertension  History of hypercholesterolemia  History of IBS  Coronary artery disease  - Status post a stent placement   History of anxiety    Orders: No orders of the defined types were placed in this encounter.  No orders of the defined types were placed in this encounter.   Face-to-face time spent with patient was 30 minutes. Greater than 50% of time was spent in counseling and coordination of care.  Follow-Up Instructions: Return in about 6 months (around 06/03/2018) for Sarcoidosis, Fibromyalgia, DDD, Uveitis .   Ofilia Neas, PA-C   I examined and evaluated the patient with Hazel Sams PA.  Patient has history of sarcoidosis.  She had no synovitis on examination.  She gives history of recurrent uveitis.  We will contact her ophthalmologist and confirm the diagnosis.   If she is having recurrent uveitis then she will need more aggressive therapy including methotrexate and/or Remicade.  She also has glaucoma which is causing issues with her eyes.  The plan of care was discussed as noted above.  Bo Merino, MD  Note - This record has been created using Editor, commissioning.  Chart creation  errors have been sought, but may not always  have been located. Such creation errors do not reflect on  the standard of medical care.

## 2017-11-23 ENCOUNTER — Telehealth: Payer: Self-pay

## 2017-11-23 NOTE — Telephone Encounter (Signed)
Pt notified samples of januvia available for pickup.

## 2017-11-26 ENCOUNTER — Telehealth: Payer: Self-pay

## 2017-11-26 NOTE — Telephone Encounter (Signed)
See if she will try Trulicity. 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

## 2017-11-26 NOTE — Telephone Encounter (Signed)
Patient notified to stop the Januvia.

## 2017-11-30 NOTE — Telephone Encounter (Signed)
The patient wanted to know if she can use Janumet cause she doesn't want to take an injection.

## 2017-12-02 ENCOUNTER — Ambulatory Visit (INDEPENDENT_AMBULATORY_CARE_PROVIDER_SITE_OTHER): Payer: Medicare Other | Admitting: Rheumatology

## 2017-12-02 ENCOUNTER — Encounter: Payer: Self-pay | Admitting: Rheumatology

## 2017-12-02 ENCOUNTER — Encounter (INDEPENDENT_AMBULATORY_CARE_PROVIDER_SITE_OTHER): Payer: Self-pay

## 2017-12-02 VITALS — BP 137/79 | HR 68 | Resp 13 | Ht 62.0 in | Wt 179.2 lb

## 2017-12-02 DIAGNOSIS — M5136 Other intervertebral disc degeneration, lumbar region: Secondary | ICD-10-CM | POA: Diagnosis not present

## 2017-12-02 DIAGNOSIS — M797 Fibromyalgia: Secondary | ICD-10-CM | POA: Diagnosis not present

## 2017-12-02 DIAGNOSIS — Z8679 Personal history of other diseases of the circulatory system: Secondary | ICD-10-CM

## 2017-12-02 DIAGNOSIS — Z8639 Personal history of other endocrine, nutritional and metabolic disease: Secondary | ICD-10-CM

## 2017-12-02 DIAGNOSIS — H401112 Primary open-angle glaucoma, right eye, moderate stage: Secondary | ICD-10-CM | POA: Diagnosis not present

## 2017-12-02 DIAGNOSIS — H401123 Primary open-angle glaucoma, left eye, severe stage: Secondary | ICD-10-CM

## 2017-12-02 DIAGNOSIS — H209 Unspecified iridocyclitis: Secondary | ICD-10-CM

## 2017-12-02 DIAGNOSIS — I25119 Atherosclerotic heart disease of native coronary artery with unspecified angina pectoris: Secondary | ICD-10-CM | POA: Diagnosis not present

## 2017-12-02 DIAGNOSIS — Z8719 Personal history of other diseases of the digestive system: Secondary | ICD-10-CM

## 2017-12-02 DIAGNOSIS — M5134 Other intervertebral disc degeneration, thoracic region: Secondary | ICD-10-CM

## 2017-12-02 DIAGNOSIS — D869 Sarcoidosis, unspecified: Secondary | ICD-10-CM | POA: Diagnosis not present

## 2017-12-02 DIAGNOSIS — Z8659 Personal history of other mental and behavioral disorders: Secondary | ICD-10-CM

## 2017-12-02 DIAGNOSIS — I2510 Atherosclerotic heart disease of native coronary artery without angina pectoris: Secondary | ICD-10-CM

## 2017-12-02 DIAGNOSIS — M5030 Other cervical disc degeneration, unspecified cervical region: Secondary | ICD-10-CM

## 2017-12-03 ENCOUNTER — Telehealth: Payer: Self-pay | Admitting: Rheumatology

## 2017-12-03 NOTE — Telephone Encounter (Signed)
Attempted to call patient and left message on machine to advise patient to call the office.

## 2017-12-03 NOTE — Telephone Encounter (Signed)
I called Dr.Kosobucki, patient's ophthalmologist.  He mentioned that patient has been having recurrent chronic iritis and uveitis.  She has been having difficulty tapering steroid eyedrops.  I discussed that patient had been on methotrexate in the past and we were not aware that she was having recurrent uveitis issues.  She can be restarted on methotrexate.  He approved restarting on methotrexate.  We will schedule an appointment for patient to discuss methotrexate and restart the medication. Bo Merino, MD

## 2017-12-04 ENCOUNTER — Telehealth: Payer: Self-pay | Admitting: Rheumatology

## 2017-12-04 NOTE — Telephone Encounter (Signed)
Patient left a voicemail stating she was returning Marissa's call.   

## 2017-12-04 NOTE — Telephone Encounter (Signed)
Spoke with patient and she has been scheduled for 12/14/17 at 9:40 am to discuss restarting MTX.

## 2017-12-04 NOTE — Telephone Encounter (Signed)
See previous phone note.  

## 2017-12-07 NOTE — Progress Notes (Signed)
Office Visit Note  Patient: Sheila Reynolds             Date of Birth: 1945/05/22           MRN: [id number]             PCP: Glendale Chard, MD Referring: Glendale Chard, MD Visit Date: 12/14/2017 Occupation: @GUAROCC @  Subjective:  Recurrent iritis  PPD: negative 03/21/09 HIV: Hepatitis panel: negative 08/09/07 SPEP: within normal limits 08/09/07 Immunoglobulins: Chest x-ray: no active disease 03/23/17  History of Present Illness: Sheila Reynolds is a 72 y.o. female with history of sarcoidosis and uveitis.  During the last visit patient complained about recurrent eye redness and having prednisone eyedrops.  I called Dr. Randell Loop who explained that patient has been having recurrent iritis and uveitis.  And she has been getting prednisone eyedrops.  I had detailed discussion with them regarding placing the patient on methotrexate.  He was in agreement.  Patient has used methotrexate in the past which was discontinued in 2014 due to low disease activity.  Her lung disease has been is stable for which she has been followed up by Dr. Annamaria Boots.  She complains of pain in her hands and muscle cramps.  She complains of bilateral knee joint pain.  She also complains of bilateral ankle joint swelling.  Activities of Daily Living:  Patient reports morning stiffness for 10 minutes.   Patient Denies nocturnal pain.  Difficulty dressing/grooming: Denies Difficulty climbing stairs: Reports Difficulty getting out of chair: Reports Difficulty using hands for taps, buttons, cutlery, and/or writing: Denies  Review of Systems  Constitutional: Positive for fatigue. Negative for night sweats, weight gain and weight loss.  HENT: Positive for mouth dryness. Negative for mouth sores, trouble swallowing, trouble swallowing and nose dryness.   Eyes: Negative for pain, redness, visual disturbance and dryness.  Respiratory: Positive for shortness of breath. Negative for cough and difficulty breathing.    Chronic bronchitis  Cardiovascular: Negative for chest pain, palpitations, hypertension, irregular heartbeat and swelling in legs/feet.  Gastrointestinal: Negative for blood in stool, constipation and diarrhea.  Endocrine: Negative for increased urination.  Genitourinary: Negative for vaginal dryness.  Musculoskeletal: Positive for arthralgias, joint pain, joint swelling, myalgias, morning stiffness and myalgias. Negative for muscle weakness and muscle tenderness.  Skin: Negative for color change, rash, hair loss, skin tightness, ulcers and sensitivity to sunlight.  Allergic/Immunologic: Negative for susceptible to infections.  Neurological: Negative for dizziness, memory loss, night sweats and weakness.  Hematological: Negative for swollen glands.  Psychiatric/Behavioral: Positive for sleep disturbance. Negative for depressed mood. The patient is not nervous/anxious.     PMFS History:  Patient Active Problem List   Diagnosis Date Noted  . 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 Fibromyalgia 06/01/2017  . DDD (degenerative disc disease), lumbar 06/01/2017  . 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 History of hypercholesterolemia 06/01/2017  . History of anxiety 06/01/2017  . History of IBS 06/01/2017  . Premature atrial contractions 03/30/2017  . PVC's (premature ventricular contractions) 03/30/2017  . Diabetes mellitus without complication ([place]) 61/95/0932  . Angina pectoris ([place]) 01/15/2017  . CAD (coronary artery disease) 01/12/2017  . Shortness of breath 12/12/2016  . Acute maxillary sinusitis 05/03/2014  . Obstructive sleep apnea 11/04/2013  . Multiple lung nodules 07/18/2012  . Heart palpitations 03/11/2012  . HTN (hypertension) 03/11/2012  . GERD (gastroesophageal reflux disease) 07/13/2011  . Sarcoidosis 02/08/2011  . Chronic bronchitis ([place]) 02/05/2011  . Hyperlipemia 09/24/2010  . Atypical chest pain 09/24/2010  .  NSVT (nonsustained ventricular tachycardia) ([place])  09/24/2010  . Leg edema 09/24/2010    Past Medical History:  Diagnosis Date  . Anemia    3 months ago anemic  . Anxiety    on meds  . Arthritis    "all over" (01/15/2017)  . Asthma   . Bronchitis with emphysema   . Chest pain   . Chronic bronchitis ([place])   . Coronary artery disease    a. 01/2017 she underwent orbital atherectomy/DES to the proxmal LAD and PTCA to ostial D2. 2D Echo 01/15/17 showed mild LVH, EF 60-65%, grade 1 DD.  . Family history of anesthesia complication    daughter N/V  . Fibromyalgia   . GERD (gastroesophageal reflux disease)    on meds  . Heart murmur   . History of hiatal hernia   . Hx of echocardiogram    Echo (03/2013):  Tech limited; Mild focal basal septal hypertrophy, EF 60-65%, normal RVF  . Hyperlipidemia   . Hypertension   . Nonsustained ventricular tachycardia ([place])   . OSA on CPAP   . Premature atrial contractions   . PVC (premature ventricular contraction)    a. Holter 12/16: NSR, occ PAC,PVCs  . Sarcoidosis   . Type II diabetes mellitus ([facility])     Family History  Problem Relation Age of Onset  . Heart disease Father   . Diabetes Father   . Glaucoma Father   . Cancer Mother        unknown type, ?lung  . Heart disease Paternal Grandmother   . Cancer Paternal Grandmother        unknown type  . Diabetes Brother   . Glaucoma Brother    Past Surgical History:  Procedure Laterality Date  . BREAST SURGERY    . CARDIAC CATHETERIZATION  05/04/2007   reveals overall normal left ventricular systolic function. Ejection fraction 65-70%  . CATARACT EXTRACTION W/PHACO Right 07/20/2013   Procedure: CATARACT EXTRACTION PHACO AND INTRAOCULAR LENS PLACEMENT (IOC);  Surgeon: Marylynn Pearson, MD;  Location: [facility];  Service: Ophthalmology;  Laterality: Right;  . COLONOSCOPY W/ BIOPSIES AND POLYPECTOMY    . COLONOSCOPY WITH PROPOFOL N/A 09/07/2014   Procedure: COLONOSCOPY WITH PROPOFOL;  Surgeon: Juanita Craver, MD;  Location: [facility];  Service:  Endoscopy;  Laterality: N/A;  . CORONARY ANGIOPLASTY WITH STENT PLACEMENT  01/15/2017  . CORONARY ATHERECTOMY N/A 01/15/2017   Procedure: CORONARY ATHERECTOMY;  Surgeon: Nelva Bush, MD;  Location: [facility];  Service: Cardiovascular;  Laterality: N/A;  . CORONARY BALLOON ANGIOPLASTY N/A 01/15/2017   Procedure: CORONARY BALLOON ANGIOPLASTY;  Surgeon: Nelva Bush, MD;  Location: [facility];  Service: Cardiovascular;  Laterality: N/A;  . CORONARY STENT INTERVENTION N/A 01/15/2017   Procedure: CORONARY STENT INTERVENTION;  Surgeon: Nelva Bush, MD;  Location: [facility];  Service: Cardiovascular;  Laterality: N/A;  . ESOPHAGOGASTRODUODENOSCOPY (EGD) WITH PROPOFOL N/A 09/07/2014   Procedure: ESOPHAGOGASTRODUODENOSCOPY (EGD) WITH PROPOFOL;  Surgeon: Juanita Craver, MD;  Location: [facility];  Service: Endoscopy;  Laterality: N/A;  . EXTERNAL EAR SURGERY Bilateral 1970s   tumors removed  . EYE SURGERY Left 2019   cataract extraction   . INTRAVASCULAR PRESSURE WIRE/FFR STUDY N/A 12/12/2016   Procedure: INTRAVASCULAR PRESSURE WIRE/FFR STUDY;  Surgeon: Nelva Bush, MD;  Location: [facility];  Service: Cardiovascular;  Laterality: N/A;  . MINI SHUNT INSERTION Right 07/20/2013   Procedure: INSERTION OF GLAUCOMA FILTRATION DEVICE RIGHT EYE;  Surgeon: Marylynn Pearson, MD;  Location: [facility];  Service: Ophthalmology;  Laterality: Right;  . MITOMYCIN C APPLICATION Right 0/93/8182   Procedure: MITOMYCIN C APPLICATION;  Surgeon: Marylynn Pearson, MD;  Location: [facility];  Service: Ophthalmology;  Laterality: Right;  . MITOMYCIN C APPLICATION Right 9/93/7169   Procedure: MITOMYCIN C APPLICATION RIGHT EYE;  Surgeon: Marylynn Pearson, MD;  Location: [facility];  Service: Ophthalmology;  Laterality: Right;  . PLACEMENT OF BREAST IMPLANTS Bilateral 1992   "took all my breast tissue out; put implants in;fibrocystic breast disease "  . RIGHT/LEFT HEART CATH AND CORONARY ANGIOGRAPHY N/A  12/12/2016   Procedure: RIGHT/LEFT HEART CATH AND CORONARY ANGIOGRAPHY;  Surgeon: Nelva Bush, MD;  Location: [facility];  Service: Cardiovascular;  Laterality: N/A;  . TONSILLECTOMY    . TOTAL ABDOMINAL HYSTERECTOMY  1982  . TRABECULECTOMY Right 02/21/2015   Procedure: TRABECULECTOMY WITH [facility] ON THE RIGHT EYE;  Surgeon: Marylynn Pearson, MD;  Location: [facility];  Service: Ophthalmology;  Laterality: Right;   Social History   Social History Narrative  . Not on file    Objective: Vital Signs: BP (!) 158/85 (BP Location: Right Arm, Patient Position: Sitting, Cuff Size: Normal)   Pulse 68   Resp 13   Ht 5\' 2"  (1.575 m)   Wt 178 lb 12.8 oz (81.1 kg)   BMI 32.70 kg/m    Physical Exam  Constitutional: She is oriented to person, place, and time. She appears well-developed and well-nourished.  HENT:  Head: Normocephalic and atraumatic.  Eyes: Conjunctivae and EOM are normal.  Neck: Normal range of motion.  Cardiovascular: Normal rate, regular rhythm, normal heart sounds and intact distal pulses.  Pulmonary/Chest: Effort normal and breath sounds normal.  Abdominal: Soft. Bowel sounds are normal.  Lymphadenopathy:    She has no cervical adenopathy.  Neurological: She is alert and oriented to person, place, and time.  Skin: Skin is warm and dry. Capillary refill takes less than 2 seconds.  Psychiatric: She has a normal mood and affect. Her behavior is normal.  Nursing note and vitals reviewed.    Musculoskeletal Exam: C-spine thoracic lumbar spine limited range of motion.  Right shoulder joint abduction was limited to 90 degrees.  Left shoulder joint was full range of motion.  Elbow joints wrist joint MCPs PIPs DIPs were in good range of motion with no synovitis.  She had discomfort range of motion of bilateral knee joints without any warmth swelling or effusion.  She has significant pedal edema over bilateral lower extremities it was difficult to appreciate any swelling in her ankle  joints.  She has some generalized hyperalgesia and positive tender points.  CDAI Exam: CDAI Score: Not documented Patient Global Assessment: Not documented; Provider Global Assessment: Not documented Swollen: Not documented; Tender: Not documented Joint Exam   Not documented   There is currently no information documented on the homunculus. Go to the Rheumatology activity and complete the homunculus joint exam.  Investigation: No additional findings.  Imaging: No results found.  Recent Labs: Lab Results  Component Value Date   WBC 7.2 03/23/2017   HGB 11.8 (L) 03/23/2017   PLT 196 03/23/2017   NA 138 04/09/2017   K 4.3 04/09/2017   CL 101 04/09/2017   CO2 24 04/09/2017   GLUCOSE 119 (H) 04/09/2017   BUN 12 04/09/2017   CREATININE 0.81 04/09/2017   BILITOT 0.3 04/09/2017   ALKPHOS 130 (H) 04/09/2017   AST 16 04/09/2017   ALT 13 04/09/2017   PROT 6.4 04/09/2017   ALBUMIN  4.3 04/09/2017   CALCIUM 9.2 04/09/2017   GFRAA 86 04/09/2017    Speciality Comments: No specialty comments available.  Procedures:  No procedures performed Allergies: Crestor [rosuvastatin calcium]; Demerol  [meperidine hcl]; Shellfish allergy; and Demerol [meperidine]   Assessment / Plan:     Visit Diagnoses: Sarcoidosis - Dr. Janee Morn most recent note on 06/21/17 sarcoidosis is clinically inactive but there is residual scarring. 12/21/17 appt with Dr. Annamaria Boots.  Uveitis -I had detailed discussion with Dr. Randell Loop over the phone after patient's last visit.  He mentioned that patient has been having recurrent uveitis.  She has been using prednisone eyedrops.  She also sees Dr. Ander Slade for treatment of glaucoma.  We discussed possible use of methotrexate.  He was in agreement.  I detailed discussion with patient regarding the methotrexate.  Indications side effects contraindications were discussed.  We will obtain following labs today.  Once the labs are available we will start her on methotrexate 6 tablets  p.o. weekly along with folic acid 2 mg p.o. daily.  If the labs are stable we will increase the dose to  8 tablets p.o. weekly.  She will need labs every 2 weeks x2 and then every 2 months to monitor for drug toxicity.  DDD (degenerative disc disease), cervical-she has chronic discomfort and limited range of motion.  DDD (degenerative disc disease), lumbar-his chronic pain.  DDD (degenerative disc disease), thoracic-currently not having much discomfort.  Primary open angle glaucoma (POAG) of left eye, severe stage-followed by ophthalmology.  Primary open angle glaucoma (POAG) of right eye, moderate stage  Fibromyalgia-she continues to have some generalized pain and discomfort.  Other medical problems are listed as follows:  History of gastroesophageal reflux (GERD)  History of diabetes mellitus  History of hypertension  History of hypercholesterolemia  History of IBS  Coronary artery disease   History of anxiety  High risk medication use - Plan: HIV Antibody (routine testing w rflx), QuantiFERON-TB Gold Plus, IgG, IgA, IgM, CBC with Differential/Platelet, COMPLETE METABOLIC PANEL WITH GFR, Hepatitis B core antibody, IgM, Hepatitis B surface antigen, Hepatitis C antibody   Orders: Orders Placed This Encounter  Procedures  . HIV Antibody (routine testing w rflx)  . QuantiFERON-TB Gold Plus  . IgG, IgA, IgM  . CBC with Differential/Platelet  . COMPLETE METABOLIC PANEL WITH GFR  . Hepatitis B core antibody, IgM  . Hepatitis B surface antigen  . Hepatitis C antibody  . CBC with Differential/Platelet  . COMPLETE METABOLIC PANEL WITH GFR   No orders of the defined types were placed in this encounter.   Face-to-face time spent with patient was 30 minutes. Greater than 50% of time was spent in counseling and coordination of care.  Follow-Up Instructions: Return in about 3 months (around 03/16/2018) for Sarcoidosis, Osteoarthritis.   Bo Merino, MD  Note - This  record has been created using Editor, commissioning.  Chart creation errors have been sought, but may not always  have been located. Such creation errors do not reflect on  the standard of medical care.

## 2017-12-09 ENCOUNTER — Telehealth: Payer: Self-pay

## 2017-12-09 NOTE — Telephone Encounter (Signed)
The pt was told that Dr. Bobetta Lime said to take the Janumet 50/500 1 tab 2 times per day for 2 weeks and then take the Janumet 50/1000 twice per day and to have a f/u in 4 weeks.

## 2017-12-09 NOTE — Telephone Encounter (Signed)
Left the pt a message that Dr. Bobetta Lime said that she doesn't have to pickup the samples of Janumet 50/1000 and can stay at the 50/500 since the pt stated that she feels like she is managing her sugars with the 50/500 dose and that she will f/u with the pt at her next appt.

## 2017-12-14 ENCOUNTER — Ambulatory Visit (INDEPENDENT_AMBULATORY_CARE_PROVIDER_SITE_OTHER): Payer: Medicare Other | Admitting: Rheumatology

## 2017-12-14 ENCOUNTER — Telehealth: Payer: Self-pay | Admitting: Pharmacist

## 2017-12-14 ENCOUNTER — Encounter: Payer: Self-pay | Admitting: Physician Assistant

## 2017-12-14 VITALS — BP 158/85 | HR 68 | Resp 13 | Ht 62.0 in | Wt 178.8 lb

## 2017-12-14 DIAGNOSIS — Z8719 Personal history of other diseases of the digestive system: Secondary | ICD-10-CM | POA: Diagnosis not present

## 2017-12-14 DIAGNOSIS — M5134 Other intervertebral disc degeneration, thoracic region: Secondary | ICD-10-CM | POA: Diagnosis not present

## 2017-12-14 DIAGNOSIS — Z79899 Other long term (current) drug therapy: Secondary | ICD-10-CM

## 2017-12-14 DIAGNOSIS — H209 Unspecified iridocyclitis: Secondary | ICD-10-CM | POA: Diagnosis not present

## 2017-12-14 DIAGNOSIS — Z8639 Personal history of other endocrine, nutritional and metabolic disease: Secondary | ICD-10-CM | POA: Diagnosis not present

## 2017-12-14 DIAGNOSIS — I2510 Atherosclerotic heart disease of native coronary artery without angina pectoris: Secondary | ICD-10-CM

## 2017-12-14 DIAGNOSIS — M797 Fibromyalgia: Secondary | ICD-10-CM

## 2017-12-14 DIAGNOSIS — D869 Sarcoidosis, unspecified: Secondary | ICD-10-CM | POA: Diagnosis not present

## 2017-12-14 DIAGNOSIS — M5030 Other cervical disc degeneration, unspecified cervical region: Secondary | ICD-10-CM

## 2017-12-14 DIAGNOSIS — Z8679 Personal history of other diseases of the circulatory system: Secondary | ICD-10-CM | POA: Diagnosis not present

## 2017-12-14 DIAGNOSIS — M5136 Other intervertebral disc degeneration, lumbar region: Secondary | ICD-10-CM

## 2017-12-14 DIAGNOSIS — I25119 Atherosclerotic heart disease of native coronary artery with unspecified angina pectoris: Secondary | ICD-10-CM

## 2017-12-14 DIAGNOSIS — H401123 Primary open-angle glaucoma, left eye, severe stage: Secondary | ICD-10-CM

## 2017-12-14 DIAGNOSIS — H401112 Primary open-angle glaucoma, right eye, moderate stage: Secondary | ICD-10-CM

## 2017-12-14 DIAGNOSIS — Z8659 Personal history of other mental and behavioral disorders: Secondary | ICD-10-CM

## 2017-12-14 NOTE — Patient Instructions (Addendum)
Standing Labs We placed an order today for your standing lab work.    Please come back and get your standing labs in 2weeks X 3 then every 2 months  We have open lab Monday through Friday from 8:30-11:30 AM and 1:30-4:00 PM  at the office of Dr. Bo Merino.   You may experience shorter wait times on Monday and Friday afternoons. The office is located at [address] [place], [place], [place] [address] No appointment is necessary.   Labs are drawn by [facility].  You may receive a bill from [facility] for your lab work. If you have any questions regarding directions or hours of operation,  please call [phone].   Just as a reminder please drink plenty of water prior to coming for your lab work. Thanks!  Vaccines You are taking a medication(s) that can suppress your immune system.  The following immunizations are recommended: . Flu annually . Pneumonia (Pneumovax 11 and Prevnar 13 after age 44) . Shingrix  Please check with your PCP to make sure you are up to date.   Methotrexate tablets What is this medicine? METHOTREXATE (METH oh TREX ate) is a chemotherapy drug used to treat cancer including breast cancer, leukemia, and lymphoma. This medicine can also be used to treat psoriasis and certain kinds of arthritis. This medicine may be used for other purposes; ask your health care provider or pharmacist if you have questions. COMMON BRAND NAME(S): Rheumatrex, Trexall What should I tell my health care provider before I take this medicine? They need to know if you have any of these conditions: -fluid in the stomach area or lungs -if you often drink alcohol -infection or immune system problems -kidney disease or on hemodialysis -liver disease -low blood counts, like low white cell, platelet, or red cell counts -lung disease -radiation therapy -stomach ulcers -ulcerative colitis -an unusual or allergic reaction to methotrexate, other medicines, foods, dyes, or  preservatives -pregnant or trying to get pregnant -breast-feeding How should I use this medicine? Take this medicine by mouth with a glass of water. Follow the directions on the prescription label. Take your medicine at regular intervals. Do not take it more often than directed. Do not stop taking except on your doctor's advice. Make sure you know why you are taking this medicine and how often you should take it. If this medicine is used for a condition that is not cancer, like arthritis or psoriasis, it should be taken weekly, NOT daily. Taking this medicine more often than directed can cause serious side effects, even death. Talk to your healthcare provider about safe handling and disposal of this medicine. You may need to take special precautions. Talk to your pediatrician regarding the use of this medicine in children. While this drug may be prescribed for selected conditions, precautions do apply. Overdosage: If you think you have taken too much of this medicine contact a poison control center or emergency room at once. NOTE: This medicine is only for you. Do not share this medicine with others. What if I miss a dose? If you miss a dose, talk with your doctor or health care professional. Do not take double or extra doses. What may interact with this medicine? This medicine may interact with the following medication: -acitretin -aspirin and aspirin-like medicines including salicylates -azathioprine -certain antibiotics like penicillins, tetracycline, and chloramphenicol -cyclosporine -gold -hydroxychloroquine -live virus vaccines -NSAIDs, medicines for pain and inflammation, like ibuprofen or naproxen -other cytotoxic agents -penicillamine -phenylbutazone -phenytoin -probenecid -retinoids such as isotretinoin  and tretinoin -steroid medicines like prednisone or cortisone -sulfonamides like sulfasalazine and trimethoprim/sulfamethoxazole -theophylline This list may not describe all  possible interactions. Give your health care provider a list of all the medicines, herbs, non-prescription drugs, or dietary supplements you use. Also tell them if you smoke, drink alcohol, or use illegal drugs. Some items may interact with your medicine. What should I watch for while using this medicine? Avoid alcoholic drinks. This medicine can make you more sensitive to the sun. Keep out of the sun. If you cannot avoid being in the sun, wear protective clothing and use sunscreen. Do not use sun lamps or tanning beds/booths. You may need blood work done while you are taking this medicine. Call your doctor or health care professional for advice if you get a fever, chills or sore throat, or other symptoms of a cold or flu. Do not treat yourself. This drug decreases your body's ability to fight infections. Try to avoid being around people who are sick. This medicine may increase your risk to bruise or bleed. Call your doctor or health care professional if you notice any unusual bleeding. Check with your doctor or health care professional if you get an attack of severe diarrhea, nausea and vomiting, or if you sweat a lot. The loss of too much body fluid can make it dangerous for you to take this medicine. Talk to your doctor about your risk of cancer. You may be more at risk for certain types of cancers if you take this medicine. Both men and women must use effective birth control with this medicine. Do not become pregnant while taking this medicine or until at least 1 normal menstrual cycle has occurred after stopping it. Women should inform their doctor if they wish to become pregnant or think they might be pregnant. Men should not father a child while taking this medicine and for 3 months after stopping it. There is a potential for serious side effects to an unborn child. Talk to your health care professional or pharmacist for more information. Do not breast-feed an infant while taking this medicine. What  side effects may I notice from receiving this medicine? Side effects that you should report to your doctor or health care professional as soon as possible: -allergic reactions like skin rash, itching or hives, swelling of the face, lips, or tongue -breathing problems or shortness of breath -diarrhea -dry, nonproductive cough -low blood counts - this medicine may decrease the number of white blood cells, red blood cells and platelets. You may be at increased risk for infections and bleeding. -mouth sores -redness, blistering, peeling or loosening of the skin, including inside the mouth -signs of infection - fever or chills, cough, sore throat, pain or trouble passing urine -signs and symptoms of bleeding such as bloody or black, tarry stools; red or dark-brown urine; spitting up blood or brown material that looks like coffee grounds; red spots on the skin; unusual bruising or bleeding from the eye, gums, or nose -signs and symptoms of kidney injury like trouble passing urine or change in the amount of urine -signs and symptoms of liver injury like dark yellow or brown urine; general ill feeling or flu-like symptoms; light-colored stools; loss of appetite; nausea; right upper belly pain; unusually weak or tired; yellowing of the eyes or skin Side effects that usually do not require medical attention (report to your doctor or health care professional if they continue or are bothersome): -dizziness -hair loss -tiredness -upset stomach -vomiting This list may  not describe all possible side effects. Call your doctor for medical advice about side effects. You may report side effects to FDA at 1-800-FDA-1088. Where should I keep my medicine? Keep out of the reach of children. Store at room temperature between 20 and 25 degrees C (68 and 77 degrees F). Protect from light. Throw away any unused medicine after the expiration date. NOTE: This sheet is a summary. It may not cover all possible information.  If you have questions about this medicine, talk to your doctor, pharmacist, or health care provider.  2018 Elsevier/Gold Standard (2014-10-02 05:39:22)

## 2017-12-14 NOTE — Progress Notes (Signed)
Pharmacy Note Subjective: Patient presents today to the [facility] to see Dr. Estanislado Pandy.   Patient seen by the pharmacist for counseling on methotrexate.  She was on methotrexate in the past with maximum tolerated dose of 20mg .  Objective: TB Test: pending 12/14/17 HIV: pending 12/14/17 Hepatitis panel:  pending 12/14/17 SPEP: within normal limits 08/09/07 Immunoglobulins: pending 12/14/17  Chest x-ray: no active disease 03/23/17  CBC/CMP: pending 12/14/17   Assessment/Plan:   Counseled patient of proper use, storage, and dosing of Methotrexate.  Reviewed common side effects such as nausea, diarrhea, and fatigue. Reviewed serious side effects including risk of pneumonitis and lymphomas.  Recommend taking the medication with dinner on Friday to mitigate stomach upset and allow for rest if fatigued.  Patient is very concerned about [place] starting a medication use to treat cancer.  She does not like the side effect profile and does not understand why we want to start her on a "dangerous" medication.  Reviewed risk vs benefits of medication and complications from her disease state. Also reminded patient of the frequent follow up and lab monitoring to assess for adverse effects. She plans to have a detailed discussion with Dr. Randell Loop  as well.  Recommend flu, Pneumovax 23, Prevnar 13, and Shingrix as indicated. She has an appointment with her pulmonologist next week and will follow up with their office.  Counseled patient on discontinuing methotrexate for surgical procedures, infection and any time patient is placed on Antibiotics. Patient states she has upcoming eye surgery in December and potentially rotator cuff surgery in the new year.  Instructed patient to call our office with questions about holding medication for those procedure.  Patient dose will be 6 tablets weekly then increase to 8 tabs after 2 weeks if tolerated.  Prescription will be sent in pending lab results.  Also  counseled on the importance of lab monitor.  Patient instructed to come in 2 weeks after starting methotrexate, then another 2 weeks, and then every 3 months.  Patient consented to Methotrexate. Will upload consent in the media tab. Standing orders placed for lab monitor all questions were encouraged and answered.   Mariella Saa, PharmD, [facility] Clinical Pharmacist  12/14/2017 11:17 AM

## 2017-12-14 NOTE — Telephone Encounter (Signed)
Patient methotrexate dose will be 6 tablets weekly then increase to 8 tabs after 2 weeks if tolerated.  Prescription will be sent in pending lab results.

## 2017-12-15 DIAGNOSIS — H3581 Retinal edema: Secondary | ICD-10-CM | POA: Diagnosis not present

## 2017-12-15 DIAGNOSIS — H35352 Cystoid macular degeneration, left eye: Secondary | ICD-10-CM | POA: Diagnosis not present

## 2017-12-15 DIAGNOSIS — H2012 Chronic iridocyclitis, left eye: Secondary | ICD-10-CM | POA: Diagnosis not present

## 2017-12-15 DIAGNOSIS — H35373 Puckering of macula, bilateral: Secondary | ICD-10-CM | POA: Diagnosis not present

## 2017-12-16 LAB — CBC WITH DIFFERENTIAL/PLATELET
BASOS PCT: 0.4 %
Basophils Absolute: 28 cells/uL (ref 0–200)
EOS ABS: 142 {cells}/uL (ref 15–500)
Eosinophils Relative: 2 %
HEMATOCRIT: 34.6 % — AB (ref 35.0–45.0)
Hemoglobin: 11.6 g/dL — ABNORMAL LOW (ref 11.7–15.5)
LYMPHS ABS: 1669 {cells}/uL (ref 850–3900)
MCH: 27.7 pg (ref 27.0–33.0)
MCHC: 33.5 g/dL (ref 32.0–36.0)
MCV: 82.6 fL (ref 80.0–100.0)
MPV: 11.2 fL (ref 7.5–12.5)
Monocytes Relative: 7.5 %
Neutro Abs: 4729 cells/uL (ref 1500–7800)
Neutrophils Relative %: 66.6 %
Platelets: 201 10*3/uL (ref 140–400)
RBC: 4.19 10*6/uL (ref 3.80–5.10)
RDW: 13.6 % (ref 11.0–15.0)
Total Lymphocyte: 23.5 %
WBC: 7.1 10*3/uL (ref 3.8–10.8)
WBCMIX: 533 {cells}/uL (ref 200–950)

## 2017-12-16 LAB — HIV ANTIBODY (ROUTINE TESTING W REFLEX): HIV: NONREACTIVE

## 2017-12-16 LAB — COMPLETE METABOLIC PANEL WITH GFR
AG Ratio: 1.8 (calc) (ref 1.0–2.5)
ALT: 13 U/L (ref 6–29)
AST: 16 U/L (ref 10–35)
Albumin: 4.4 g/dL (ref 3.6–5.1)
Alkaline phosphatase (APISO): 112 U/L (ref 33–130)
BUN: 15 mg/dL (ref 7–25)
CALCIUM: 9.6 mg/dL (ref 8.6–10.4)
CO2: 30 mmol/L (ref 20–32)
CREATININE: 0.78 mg/dL (ref 0.60–0.93)
Chloride: 103 mmol/L (ref 98–110)
GFR, EST NON AFRICAN AMERICAN: 77 mL/min/{1.73_m2} (ref >=60)
GFR, Est African American: 89 mL/min/{1.73_m2} (ref >=60)
GLOBULIN: 2.5 g/dL (ref 1.9–3.7)
Glucose, Bld: 82 mg/dL (ref 65–99)
Potassium: 3.9 mmol/L (ref 3.5–5.3)
Sodium: 140 mmol/L (ref 135–146)
Total Bilirubin: 0.5 mg/dL (ref 0.2–1.2)
Total Protein: 6.9 g/dL (ref 6.1–8.1)

## 2017-12-16 LAB — HEPATITIS B CORE ANTIBODY, IGM: HEP B C IGM: NONREACTIVE

## 2017-12-16 LAB — QUANTIFERON-TB GOLD PLUS
NIL: 0.02 IU/mL
QuantiFERON-TB Gold Plus: NEGATIVE
TB1-NIL: 0 IU/mL
TB2-NIL: 0 IU/mL

## 2017-12-16 LAB — HEPATITIS B SURFACE ANTIGEN: HEP B S AG: NONREACTIVE

## 2017-12-16 LAB — IGG, IGA, IGM
IGG (IMMUNOGLOBIN G), SERUM: 818 mg/dL (ref 600–1540)
IGM, SERUM: 55 mg/dL (ref 50–300)
IMMUNOGLOBULIN A: 195 mg/dL (ref 20–320)

## 2017-12-16 LAB — HEPATITIS C ANTIBODY
HEP C AB: NONREACTIVE
SIGNAL TO CUT-OFF: 0.01 (ref <1.00)

## 2017-12-16 NOTE — Progress Notes (Signed)
Labs are stable.

## 2017-12-17 ENCOUNTER — Ambulatory Visit: Payer: Medicare Other | Admitting: Internal Medicine

## 2017-12-17 MED ORDER — METHOTREXATE 2.5 MG PO TABS: ORAL_TABLET | ORAL | 0 refills | Status: DC

## 2017-12-17 MED ORDER — FOLIC ACID 1 MG PO TABS: 2.0000 mg | ORAL_TABLET | Freq: Every day | ORAL | 3 refills | Status: DC

## 2017-12-17 NOTE — Addendum Note (Signed)
Addended by: Carole Binning on: 12/17/2017 04:17 PM   Modules accepted: Orders

## 2017-12-17 NOTE — Telephone Encounter (Signed)
Labs resulted and are stable. Prescription sent to pharmacy.

## 2017-12-17 NOTE — Addendum Note (Signed)
Addended by: Carole Binning on: 12/17/2017 03:22 PM   Modules accepted: Orders

## 2017-12-21 ENCOUNTER — Ambulatory Visit (INDEPENDENT_AMBULATORY_CARE_PROVIDER_SITE_OTHER): Payer: Medicare Other | Admitting: Internal Medicine

## 2017-12-21 ENCOUNTER — Ambulatory Visit (INDEPENDENT_AMBULATORY_CARE_PROVIDER_SITE_OTHER)
Admission: RE | Admit: 2017-12-21 | Discharge: 2017-12-21 | Disposition: A | Discharge status: Home / Self Care | Payer: Medicare Other | Source: Ambulatory Visit | Attending: Internal Medicine | Admitting: Internal Medicine

## 2017-12-21 ENCOUNTER — Other Ambulatory Visit: Payer: Medicare Other

## 2017-12-21 ENCOUNTER — Encounter: Payer: Self-pay | Admitting: Internal Medicine

## 2017-12-21 VITALS — BP 140/82 | HR 78 | Ht 62.0 in | Wt 177.6 lb

## 2017-12-21 DIAGNOSIS — Z23 Encounter for immunization: Secondary | ICD-10-CM | POA: Diagnosis not present

## 2017-12-21 DIAGNOSIS — G4733 Obstructive sleep apnea (adult) (pediatric): Secondary | ICD-10-CM

## 2017-12-21 DIAGNOSIS — I2510 Atherosclerotic heart disease of native coronary artery without angina pectoris: Secondary | ICD-10-CM | POA: Diagnosis not present

## 2017-12-21 DIAGNOSIS — R0609 Other forms of dyspnea: Secondary | ICD-10-CM

## 2017-12-21 DIAGNOSIS — J418 Mixed simple and mucopurulent chronic bronchitis: Secondary | ICD-10-CM

## 2017-12-21 DIAGNOSIS — R0789 Other chest pain: Secondary | ICD-10-CM | POA: Diagnosis not present

## 2017-12-21 DIAGNOSIS — R0602 Shortness of breath: Secondary | ICD-10-CM | POA: Diagnosis not present

## 2017-12-21 DIAGNOSIS — R918 Other nonspecific abnormal finding of lung field: Secondary | ICD-10-CM | POA: Diagnosis not present

## 2017-12-21 DIAGNOSIS — D869 Sarcoidosis, unspecified: Secondary | ICD-10-CM

## 2017-12-21 DIAGNOSIS — R0600 Dyspnea, unspecified: Secondary | ICD-10-CM

## 2017-12-21 MED ORDER — VARICELLA VIRUS VACCINE LIVE 1350 PFU/0.5ML IJ SUSR: 0.5000 mL | Freq: Once | INTRAMUSCULAR | 1 refills | Status: AC

## 2017-12-21 MED ORDER — ALBUTEROL SULFATE HFA 108 (90 BASE) MCG/ACT IN AERS: 2.0000 | INHALATION_SPRAY | Freq: Four times a day (QID) | RESPIRATORY_TRACT | 12 refills | Status: DC | PRN

## 2017-12-21 NOTE — Patient Instructions (Signed)
Order- Flu vax- senior  Order- Pneumovax-23 pneumonia vaccine  Order- Print script for ShingleRx shingles vaccine   2 doses (Can be used at [place]    Dx dyspnea on exertion  Order- Walk test on room air            "  Order- Office spirometry                  "  Order- lab- ACE level   Dx sarcoid  Script sent refilling albuterol rescue inhaler

## 2017-12-21 NOTE — Progress Notes (Signed)
HPI  F former smoker followed for bronchitis, hx  Occular sarcoid, bronchitis/ nodules, OSA complicated by GERD, glaucoma, DM2 NPSG 12/25/03- AHI  2.9/ hr, desaturation to 91%, body weight 164 lbs NPSG-10/18/13- Mild OSA, AHI 9.3/ hr, weight 164 lbs  ACE 09/17/15-47-WNL ACE level 08/18/16-30 PFT: 01/07/2011-FEV1 1.59/79%, FEV1/FVC 0.73, FEF 25-75% improved to 38% with bronchodilator. TLC 90% DLCO 55% Office Spirometry 06/09/16- moderate restriction of exhaled volume. FVC 1.51/68%, FEV1 1.20/70%, ratio 0.79, FEF 25-75% 1.19/73% Nuclear Stress Test 09/04/16- EF 65%, low risk. ECHO 07/31/16- Gr 1 DD Walk test on room air 12/21/2017-minimal saturation 94%, maximum heart rate 118/minute with no stops walking 3 laps x180 m. ----------------------------------------------------------------------------------------------------------------------  06/18/2017- 72 yo female former smoker followed for bronchitis/bronchiolitis/lung nodules, history of ocular sarcoid, OSA, complicated by GERD, Glaucoma, DM 2, CAD/ stent CPAP 9/Advanced ----OSA; DME: [facility]. Pt wears CPAP nightly and DL attached. Pressure works well for pt and no new supplies needed at this time.  Download 80% compliance AHI 1.4/hour.  She sleeps better with CPAP. Concerned she has a sinus infection which did not clear with 7 days of amoxicillin.  Cough is productive as she is developing a bronchitis.  No fever.  Slightly discolored sputum.  Little headache. Pending glaucoma/cataract surgery end of this month.  12/21/2017- 72 yo female former smoker followed for bronchitis/bronchiolitis/lung nodules, history of ocular sarcoid, OSA, complicated by GERD, Glaucoma, DM 2, CAD/ stent CPAP 9/Advanced -----OSA: DME: [facility]  Pt states she is trying to wear CPAP most nights but states she is concerned with pressure settings, getting use to the machine, and face mask isses. Pt will need order for new supplies DL attached  MTX, Dulera 200, neb albuterol, Download 47%  compliance, reflecting 2 weeks last month when she did not wear it.  AHI 0.6/hour. Says she is felt short of breath all summer, mainly with exertion.  Pains under her left breast around to back severe/intermittent over past 4 months unrelated to exertion, lifting or movement.  Increased cough productive clear or yellow sputum but without fever or sweat. Office Spirometry 12/21/2017-severe obstruction with restriction of exhaled volume.  FVC 1.1/52%, FEV1 0.9/55%, ratio 1.81, FEF 25-75% 0.9/58% Walk test on room air 12/21/2017-minimal saturation 94%, maximum heart rate 118/minute with no stops walking 3 laps x180 m.  ROS-see HPI     + = positive Constitutional:   No-   weight loss, night sweats, fevers, chills, fatigue, lassitude. HEENT:   +  headaches, difficulty swallowing, tooth/dental problems, sore throat,       Some  sneezing, itching, ear ache, +nasal congestion, post nasal drip,  CV: + chest pain, no-orthopnea, PND, swelling in lower extremities, anasarca, dizziness, palpitations Resp: +  shortness of breath with exertion not at rest.            +productive cough,  + non-productive cough,  No- coughing up of blood.               in color of mucus.   wheezing.   Skin: No-   rash or lesions. GI:  +  heartburn, indigestion, abdominal pain, nausea, vomiting,  GU:  MS:  +  joint pain or swelling, + back pain Neuro-     nothing unusual Psych:  No- change in mood or affect. No depression or anxiety.  No memory loss.  OBJ   General- Alert, Oriented, Affect anxious, Distress- none acute + overweight Skin- rash-none, lesions- none, excoriation- none Lymphadenopathy- none Head- atraumatic  Eyes- Gross vision intact, PERRLA, conjunctivae clear secretions- not injected            Ears- Hearing aid             Nose- + turbinate edema, no-Septal dev, mucus, polyps, erosion, perforation             Throat- Mallampati III , mucosa clear , drainage- none, tonsils- atrophic Neck- flexible  , trachea midline, no stridor , thyroid nl, carotid no bruit Chest - symmetrical excursion , unlabored           Heart/CV- RRR , no murmur , no gallop  , no rub, nl s1 s2                           - JVD- none , edema- none, stasis changes- none, varices- none           Lung-  Cough+light , wheeze-none, rhonchi-none , dullness-none, rub- none           Chest wall-  Abd- No HSM Br/ Gen/ Rectal- Not done, not indicated Extrem- cyanosis- none, clubbing, none, atrophy- none, strength- nl.  Neuro- grossly intact to observation

## 2017-12-24 LAB — ANGIOTENSIN CONVERTING ENZYME: Angiotensin-Converting Enzyme: 52 U/L (ref 9–67)

## 2017-12-25 MED FILL — SHINGRIX 50 MCG SUS: 50 | 1 days supply | Qty: 1 | Fill #0

## 2018-01-04 DIAGNOSIS — H02421 Myogenic ptosis of right eyelid: Secondary | ICD-10-CM | POA: Diagnosis not present

## 2018-01-04 DIAGNOSIS — H02403 Unspecified ptosis of bilateral eyelids: Secondary | ICD-10-CM | POA: Diagnosis not present

## 2018-01-11 ENCOUNTER — Ambulatory Visit: Payer: Self-pay | Admitting: Internal Medicine

## 2018-01-11 ENCOUNTER — Telehealth: Payer: Self-pay | Admitting: *Deleted

## 2018-01-11 NOTE — Telephone Encounter (Signed)
   [facility] Pre-operative Risk Assessment    Request for surgical clearance:  1. What type of surgery is being performed? 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 When is this surgery scheduled? TBD   3. Are there any medications that need to be held prior to surgery and how long? PLAVIX, ASA   4. Practice name and name of physician performing surgery? [facility]; DR. Melodye Ped RANJIT REEVES   5. What is your office phone and fax number? PH# [phone]; FAX# [phone]   6. Anesthesia type (None, local, MAC, general) ? MONITORED IV (SEMI CONSCIOUS SEDATION)    Sheila Reynolds 01/11/2018, 2:39 PM  _________________________________________________________________   (provider comments below)

## 2018-01-12 ENCOUNTER — Ambulatory Visit: Payer: Medicare Other | Admitting: Internal Medicine

## 2018-01-13 ENCOUNTER — Encounter: Payer: Self-pay | Admitting: Internal Medicine

## 2018-01-13 ENCOUNTER — Ambulatory Visit (INDEPENDENT_AMBULATORY_CARE_PROVIDER_SITE_OTHER): Payer: Medicare Other | Admitting: Internal Medicine

## 2018-01-13 VITALS — BP 148/76 | HR 74 | Temp 98.1°F | Ht 62.0 in | Wt 174.0 lb

## 2018-01-13 DIAGNOSIS — M12811 Other specific arthropathies, not elsewhere classified, right shoulder: Secondary | ICD-10-CM

## 2018-01-13 DIAGNOSIS — E1165 Type 2 diabetes mellitus with hyperglycemia: Secondary | ICD-10-CM | POA: Diagnosis not present

## 2018-01-13 DIAGNOSIS — E7800 Pure hypercholesterolemia, unspecified: Secondary | ICD-10-CM

## 2018-01-13 DIAGNOSIS — I119 Hypertensive heart disease without heart failure: Secondary | ICD-10-CM | POA: Diagnosis not present

## 2018-01-13 DIAGNOSIS — I25119 Atherosclerotic heart disease of native coronary artery with unspecified angina pectoris: Secondary | ICD-10-CM | POA: Diagnosis not present

## 2018-01-13 DIAGNOSIS — G8929 Other chronic pain: Secondary | ICD-10-CM

## 2018-01-13 DIAGNOSIS — M75101 Unspecified rotator cuff tear or rupture of right shoulder, not specified as traumatic: Secondary | ICD-10-CM

## 2018-01-13 DIAGNOSIS — K219 Gastro-esophageal reflux disease without esophagitis: Secondary | ICD-10-CM | POA: Diagnosis not present

## 2018-01-13 DIAGNOSIS — Z79899 Other long term (current) drug therapy: Secondary | ICD-10-CM | POA: Diagnosis not present

## 2018-01-13 MED ORDER — TRAMADOL HCL 50 MG PO TABS: 50.0000 mg | ORAL_TABLET | Freq: Four times a day (QID) | ORAL | 0 refills | Status: DC | PRN

## 2018-01-13 NOTE — Patient Instructions (Signed)

## 2018-01-14 LAB — CMP14+EGFR
ALBUMIN: 4.4 g/dL (ref 3.5–4.8)
ALT: 13 IU/L (ref 0–32)
AST: 15 IU/L (ref 0–40)
Albumin/Globulin Ratio: 2 (ref 1.2–2.2)
Alkaline Phosphatase: 100 IU/L (ref 39–117)
BUN / CREAT RATIO: 13 (ref 12–28)
BUN: 10 mg/dL (ref 8–27)
Bilirubin Total: 0.3 mg/dL (ref 0.0–1.2)
CO2: 22 mmol/L (ref 20–29)
CREATININE: 0.75 mg/dL (ref 0.57–1.00)
Calcium: 9 mg/dL (ref 8.7–10.3)
Chloride: 102 mmol/L (ref 96–106)
GFR calc Af Amer: 93 mL/min/{1.73_m2} (ref >59)
GFR calc non Af Amer: 81 mL/min/{1.73_m2} (ref >59)
GLUCOSE: 115 mg/dL — AB (ref 65–99)
Globulin, Total: 2.2 g/dL (ref 1.5–4.5)
Potassium: 4.4 mmol/L (ref 3.5–5.2)
Sodium: 140 mmol/L (ref 134–144)
Total Protein: 6.6 g/dL (ref 6.0–8.5)

## 2018-01-14 LAB — HEMOGLOBIN A1C
Est. average glucose Bld gHb Est-mCnc: 137 mg/dL
HEMOGLOBIN A1C: 6.4 % — AB (ref 4.8–5.6)

## 2018-01-14 NOTE — Telephone Encounter (Signed)
   Primary Cardiologist: Mertie Moores, MD  Chart reviewed as part of pre-operative protocol coverage. H/o CAD with PCI in 01/2017, fibromyalgia, HLD, sarcoidosis, NSVT. She has previously been cleared to come off Plavix but asked to remain on ASA for prior shoulder surgery. At that time she was <12 months out from PCI. She is now 12 months out as of tomorrow.  Will route to Dr. Acie Fredrickson for input on if OK to hold both Plavix and ASA for procedure below, and then pt will need call to ensure doing OK. Dr. Acie Fredrickson - Please route response to P CV DIV PREOP (the pre-op pool). Thank you.    Charlie Pitter, PA-C 01/14/2018, 3:27 PM

## 2018-01-14 NOTE — Progress Notes (Signed)
Your hba1c is 6.4 this is great. Your liver and kidney fxn are nl.

## 2018-01-15 NOTE — Telephone Encounter (Signed)
OK to hold both plavix and ASA for 5-7 days for eyelid procedure. Restart when OK with the ophthalmologist

## 2018-01-15 NOTE — Telephone Encounter (Signed)
Pt has a surgery date now of 02/09/18

## 2018-01-15 NOTE — Telephone Encounter (Signed)
Called pt to determine current symptom status and clear for surgery. No answer at home. Left VM on cell to call back.

## 2018-01-17 NOTE — Progress Notes (Signed)
Subjective:     Patient ID: Sheila Reynolds , female    DOB: 03-07-1945 , 72 y.o.   MRN: [id number]   Chief Complaint  Patient presents with  . Diabetes  . Hypertension    HPI  Diabetes  She presents for her follow-up diabetic visit. She has type 2 diabetes mellitus. Her disease course has been improving. There are no hypoglycemic associated symptoms. There are no hypoglycemic complications. Risk factors for coronary artery disease include diabetes mellitus, dyslipidemia, hypertension, post-menopausal and sedentary lifestyle.  Hypertension  This is a chronic problem. The current episode started more than 1 year ago. The problem has been gradually improving since onset. The problem is controlled.   She reports compliance with meds.   Past Medical History:  Diagnosis Date  . Anemia    3 months ago anemic  . Anxiety    on meds  . Arthritis    "all over" (01/15/2017)  . Asthma   . Bronchitis with emphysema   . Chest pain   . Chronic bronchitis ([place])   . Coronary artery disease    a. 01/2017 she underwent orbital atherectomy/DES to the proxmal LAD and PTCA to ostial D2. 2D Echo 01/15/17 showed mild LVH, EF 60-65%, grade 1 DD.  . Family history of anesthesia complication    daughter N/V  . Fibromyalgia   . GERD (gastroesophageal reflux disease)    on meds  . Heart murmur   . History of hiatal hernia   . Hx of echocardiogram    Echo (03/2013):  Tech limited; Mild focal basal septal hypertrophy, EF 60-65%, normal RVF  . Hyperlipidemia   . Hypertension   . Nonsustained ventricular tachycardia ([place])   . OSA on CPAP   . Premature atrial contractions   . PVC (premature ventricular contraction)    a. Holter 12/16: NSR, occ PAC,PVCs  . Sarcoidosis   . Type II diabetes mellitus (HCC)      Family History  Problem Relation Age of Onset  . Heart disease Father   . Diabetes Father   . Glaucoma Father   . Cancer Mother        unknown type, ?lung  . Heart disease Paternal  Grandmother   . Cancer Paternal Grandmother        unknown type  . Diabetes Brother   . Glaucoma Brother      Current Outpatient Medications:  .  acetaminophen (TYLENOL) 500 MG tablet, Take 1,000 mg by mouth 2 (two) times daily as needed for moderate pain or headache., Disp: , Rfl:  .  albuterol (PROVENTIL HFA;VENTOLIN HFA) 108 (90 Base) MCG/ACT inhaler, Inhale 2 puffs into the lungs every 6 (six) hours as needed for wheezing or shortness of breath., Disp: 1 Inhaler, Rfl: 12 .  albuterol (PROVENTIL) (2.5 MG/3ML) 0.083% nebulizer solution, Take 2.5 mg by nebulization daily as needed for wheezing or shortness of breath. , Disp: , Rfl:  .  aspirin EC 81 MG tablet, Take 81 mg by mouth at bedtime. , Disp: , Rfl:  .  atorvastatin (LIPITOR) 40 MG tablet, TAKE 1 TABLET(40 MG) BY MOUTH DAILY, Disp: 30 tablet, Rfl: 8 .  brimonidine (ALPHAGAN P) 0.1 % SOLN, Place 1 drop into the left eye 3 (three) times daily. , Disp: , Rfl:  .  brinzolamide (AZOPT) 1 % ophthalmic suspension, Place 1 drop into the left eye 3 (three) times daily. , Disp: , Rfl:  .  carvedilol (COREG) 12.5 MG tablet, Take 1 tablet (12.5 mg  total) by mouth 2 (two) times daily., Disp: 180 tablet, Rfl: 3 .  clopidogrel (PLAVIX) 75 MG tablet, Take 1 tablet (75 mg total) by mouth daily., Disp: 90 tablet, Rfl: 3 .  folic acid (FOLVITE) 1 MG tablet, Take 2 tablets (2 mg total) by mouth daily., Disp: 180 tablet, Rfl: 3 .  furosemide (LASIX) 20 MG tablet, TAKE 1 TABLET BY MOUTH EVERY 3 DAYS, Disp: 45 tablet, Rfl: 3 .  guaiFENesin (ROBITUSSIN) 100 MG/5ML SOLN, Take 5 mLs by mouth every 6 (six) hours as needed for cough or to loosen phlegm. , Disp: , Rfl:  .  loteprednol (LOTEMAX) 0.5 % ophthalmic suspension, See admin instructions. Instill 8 DROPS IN LEFT EYE 3 IN RIGHT EYE DAILY, Disp: , Rfl:  .  methotrexate (RHEUMATREX) 2.5 MG tablet, Take 6 tabs weekly x 2 weeks. Increase to 8 tabs po weekly. Caution:Chemotherapy. Protect from light., Disp: 28  tablet, Rfl: 0 .  mometasone-formoterol (DULERA) 200-5 MCG/ACT AERO, Inhale 2 puffs into the lungs as needed for wheezing., Disp: , Rfl:  .  montelukast (SINGULAIR) 10 MG tablet, Take 10 mg by mouth at bedtime., Disp: , Rfl:  .  olmesartan (BENICAR) 20 MG tablet, Take 20 mg by mouth daily., Disp: , Rfl:  .  OVER THE COUNTER MEDICATION, , Disp: , Rfl:  .  pantoprazole (PROTONIX) 40 MG tablet, TAKE 1 TABLET(40 MG) BY MOUTH DAILY, Disp: 30 tablet, Rfl: 9 .  Polyvinyl Alcohol-Povidone (REFRESH OP), Apply to eye daily., Disp: , Rfl:  .  Pregabalin ER (LYRICA CR) 165 MG TB24, Take 1 tablet by mouth at bedtime., Disp: , Rfl:  .  sitaGLIPtin-metformin (JANUMET) 50-500 MG tablet, Take 1 tablet by mouth 2 (two) times daily with a meal., Disp: , Rfl:  .  traMADol (ULTRAM) 50 MG tablet, Take 1 tablet (50 mg total) by mouth every 6 (six) hours as needed., Disp: 40 tablet, Rfl: 0 .  travoprost, benzalkonium, (TRAVATAN) 0.004 % ophthalmic solution, Place 1 drop into the right eye at bedtime. , Disp: , Rfl:  .  isosorbide mononitrate (IMDUR) 60 MG 24 hr tablet, Take 1 tablet (60 mg total) by mouth daily., Disp: 90 tablet, Rfl: 3 .  nitroGLYCERIN (NITROSTAT) 0.4 MG SL tablet, Place 1 tablet (0.4 mg total) under the tongue every 5 (five) minutes as needed for chest pain., Disp: 25 tablet, Rfl: prn   Allergies  Allergen Reactions  . Crestor [Rosuvastatin Calcium] Other (See Comments)    muscle aches  . Demerol  [Meperidine Hcl]     Other reaction(s): Hallucinations  . Shellfish Allergy Itching and Other (See Comments)    Crab, shrimp and lobster ---lips itch and tingle Tingling  Was told not to eat again after having a allergy test. Lobster, crab and shrimp Crab, shrimp and lobster ---lips itch and tingle PATIENT STATES SHE HAD A POSITVE ALLERGY TEST   . Demerol [Meperidine] Other (See Comments)    Hallucinations      Review of Systems  Constitutional: Negative.   Respiratory: Negative.    Cardiovascular: Negative.   Gastrointestinal: Negative.   Neurological: Negative.   Psychiatric/Behavioral: Negative.      Today's Vitals   01/13/18 0919  BP: (!) 148/76  Pulse: 74  Temp: 98.1 F (36.7 C)  TempSrc: Oral  Weight: 174 lb (78.9 kg)  Height: '5\' 2"'$  (1.575 m)  PainSc: 0-No pain   Body mass index is 31.83 kg/m.   Objective:  Physical Exam  Constitutional: She is oriented to person,  place, and time. She appears well-developed and well-nourished.  HENT:  Head: Normocephalic and atraumatic.  Eyes: EOM are normal.  Cardiovascular: Normal rate, regular rhythm and normal heart sounds.  Pulmonary/Chest: Effort normal and breath sounds normal.  Neurological: She is alert and oriented to person, place, and time.  Psychiatric: She has a normal mood and affect.  Nursing note and vitals reviewed.       Assessment And Plan:     1. Uncontrolled type 2 diabetes mellitus with hyperglycemia ([place])  I will check labs as listed below. She is encouraged to avoid sugary beverages and processed foods. Importance of regular exercise was also discussed with the patient.   - Hemoglobin A1c - CMP14+EGFR  2. Hypertensive heart disease without congestive heart failure  Fair control. She will continue with current meds for now. She is encouraged to avoid adding salt to her foods.   3. Coronary artery disease involving native coronary artery of native heart with angina pectoris (HCC)  Chronic. She is also followed by cardiology.   4. Gastroesophageal reflux disease without esophagitis  Chronic. She is advised to increase her pantoprazole to twice daily x 2 weeks. If she does not have any relief by then, I will consider extending time for bid dosing. She is advised to stop eating 3 hours prior to bedtime.   5. Pure hypercholesterolemia  I will check lipid panel at her next visit. She is encouraged to avoid fried foods and to increase her fish intake.   6. Rotator cuff tear  arthropathy of right shoulder  Chronic, as per Ortho.   7. Drug therapy   Maximino Greenland, MD

## 2018-01-19 NOTE — Telephone Encounter (Signed)
1st attempt: LVM for patient to call back and schedule appointment

## 2018-01-19 NOTE — Telephone Encounter (Signed)
   Primary Cardiologist: Mertie Moores, MD  Chart reviewed as part of pre-operative protocol coverage. Patient was contacted 01/19/2018 in reference to pre-operative risk assessment for pending surgery as outlined below.  Sheila Reynolds was last seen on 10/05/2017 by Dr. Acie Fredrickson.  Since that day, Sheila Reynolds has continued to have dyspnea on exertion that seems to have been present for several years.  She had a low risk stress test in June and I feel that she would be okay for this low risk procedure, however the patient is concerned about her daily dyspnea on exertion and would like to be seen prior to her procedure on 02/09/18.  Pre-op covering staff: - Please schedule appointment and call patient to inform them. - Please contact requesting surgeon's office via preferred method (i.e, phone, fax) to inform them of need for appointment prior to surgery.  Daune Perch, NP 01/19/2018, 2:49 PM

## 2018-01-19 NOTE — Telephone Encounter (Signed)
Attempted to contact pt. VM left on cell. No answer on home phone.

## 2018-01-20 ENCOUNTER — Telehealth: Payer: Self-pay

## 2018-01-20 NOTE — Telephone Encounter (Signed)
2nd attempt: LVM for patient to call back and schedule appointment

## 2018-01-20 NOTE — Telephone Encounter (Signed)
error 

## 2018-01-22 NOTE — Telephone Encounter (Signed)
3rd attempt to reach pt.  House phone has no voicemail. Left message on cell phone for pt to contact the office re: appt for surgical clearance.

## 2018-01-22 NOTE — Telephone Encounter (Signed)
Pt has been scheduled to see Dr. Acie Fredrickson,  01/29/18 @ 8:20, pt has been made aware.

## 2018-01-27 ENCOUNTER — Telehealth: Payer: Self-pay | Admitting: Rheumatology

## 2018-01-27 MED ORDER — METHOTREXATE 2.5 MG PO TABS: 20.0000 mg | ORAL_TABLET | ORAL | 0 refills | Status: DC

## 2018-01-27 NOTE — Telephone Encounter (Signed)
Last visit: 12/14/17 Next Visit: 03/16/18 Labs: 12/14/17 stable  Okay to refill per Dr. Estanislado Pandy  Patient advised to come get labs and to continue her medication. Patient verbalized understanding. Prescription sent to the pharmacy.

## 2018-01-27 NOTE — Telephone Encounter (Signed)
Patient took last dose of Mtx Thursday. Patient would like to know what she is to do now. Patient needs a refill. Please call to advise.

## 2018-01-29 ENCOUNTER — Other Ambulatory Visit: Payer: Self-pay

## 2018-01-29 ENCOUNTER — Telehealth (HOSPITAL_COMMUNITY): Payer: Self-pay | Admitting: *Deleted

## 2018-01-29 ENCOUNTER — Encounter (INDEPENDENT_AMBULATORY_CARE_PROVIDER_SITE_OTHER): Payer: Self-pay

## 2018-01-29 ENCOUNTER — Encounter: Payer: Self-pay | Admitting: Cardiovascular Disease

## 2018-01-29 ENCOUNTER — Ambulatory Visit (INDEPENDENT_AMBULATORY_CARE_PROVIDER_SITE_OTHER): Payer: Medicare Other | Admitting: Cardiovascular Disease

## 2018-01-29 ENCOUNTER — Encounter: Payer: Self-pay | Admitting: Nurse Practitioner

## 2018-01-29 VITALS — BP 130/82 | HR 70 | Ht 62.0 in | Wt 174.0 lb

## 2018-01-29 DIAGNOSIS — Z79899 Other long term (current) drug therapy: Secondary | ICD-10-CM | POA: Diagnosis not present

## 2018-01-29 DIAGNOSIS — E782 Mixed hyperlipidemia: Secondary | ICD-10-CM | POA: Diagnosis not present

## 2018-01-29 DIAGNOSIS — I2510 Atherosclerotic heart disease of native coronary artery without angina pectoris: Secondary | ICD-10-CM

## 2018-01-29 DIAGNOSIS — R0609 Other forms of dyspnea: Secondary | ICD-10-CM

## 2018-01-29 DIAGNOSIS — R0600 Dyspnea, unspecified: Secondary | ICD-10-CM

## 2018-01-29 LAB — COMPLETE METABOLIC PANEL WITH GFR
AG Ratio: 1.8 (calc) (ref 1.0–2.5)
ALT: 13 U/L (ref 6–29)
AST: 14 U/L (ref 10–35)
Albumin: 4.2 g/dL (ref 3.6–5.1)
Alkaline phosphatase (APISO): 99 U/L (ref 33–130)
BUN: 14 mg/dL (ref 7–25)
CO2: 28 mmol/L (ref 20–32)
Calcium: 9.5 mg/dL (ref 8.6–10.4)
Chloride: 104 mmol/L (ref 98–110)
Creat: 0.77 mg/dL (ref 0.60–0.93)
GFR, EST NON AFRICAN AMERICAN: 78 mL/min/{1.73_m2} (ref >=60)
GFR, Est African American: 91 mL/min/{1.73_m2} (ref >=60)
GLOBULIN: 2.3 g/dL (ref 1.9–3.7)
Glucose, Bld: 81 mg/dL (ref 65–99)
Potassium: 4 mmol/L (ref 3.5–5.3)
Sodium: 141 mmol/L (ref 135–146)
Total Bilirubin: 0.3 mg/dL (ref 0.2–1.2)
Total Protein: 6.5 g/dL (ref 6.1–8.1)

## 2018-01-29 LAB — CBC WITH DIFFERENTIAL/PLATELET
Absolute Monocytes: 555 cells/uL (ref 200–950)
BASOS ABS: 30 {cells}/uL (ref 0–200)
Basophils Relative: 0.4 %
EOS ABS: 126 {cells}/uL (ref 15–500)
Eosinophils Relative: 1.7 %
HEMATOCRIT: 33.8 % — AB (ref 35.0–45.0)
Hemoglobin: 11.3 g/dL — ABNORMAL LOW (ref 11.7–15.5)
Lymphs Abs: 1643 cells/uL (ref 850–3900)
MCH: 28.2 pg (ref 27.0–33.0)
MCHC: 33.4 g/dL (ref 32.0–36.0)
MCV: 84.3 fL (ref 80.0–100.0)
MPV: 10.9 fL (ref 7.5–12.5)
Monocytes Relative: 7.5 %
Neutro Abs: 5047 cells/uL (ref 1500–7800)
Neutrophils Relative %: 68.2 %
Platelets: 224 10*3/uL (ref 140–400)
RBC: 4.01 10*6/uL (ref 3.80–5.10)
RDW: 14.5 % (ref 11.0–15.0)
Total Lymphocyte: 22.2 %
WBC: 7.4 10*3/uL (ref 3.8–10.8)

## 2018-01-29 NOTE — Progress Notes (Signed)
Cardiology Office Note   Date:  01/29/2018   ID:  Sheila, Reynolds [birth year], MRN [id number]  PCP:  Glendale Chard, MD  Cardiologist:   Mertie Moores, MD   No chief complaint on file.  1. Hyperlipidemia 2.  CAD -  orbital atherectomy and PCI using Xience Sierra 3.5 x 33 mm DES to prox / mid LAD  3. Nonsustained ventricular tachycardia 4. Mild to moderate coronary artery disease by cath in 2009. 5. 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 Darrick Penna is a 72 y.o. female with the above noted hx. She has continued to have some palpitations. She has occasional episodes of lightheadedness when she has palpitations. Her main issue has been lots of total body cramps. She has been seen in the emergency room. Her lab work looked fine.  She's had lots of dyspnea especially with exertion. She has a history of sarcoidosis and is followed by Dr. Annamaria Boots.  March 11, 2012: Sheila Reynolds has had more palpitations recently. It Appears that her metoprolol dose was decreased slightly. We placed an event monitor on her. She did not have any episodes of atrial fibrillation. She has occasional drinker atrial contractions and occasional premature ventricular contractions. There were no life-threatening arrhythmias.  She is doing well from a cardiac standpoint. She's not had any episodes of syncope. She has significant shortness of breath especially when climbing stairs. This is likely due to her sarcoidosis. She denies any chest pain. Her BP is a bit elevated. She avoids salt.   May 02, 2013 :  She presented to see Richardson Dopp in February with some increased palpitations and lightheadedness. He recommended that she decrease her caffeine intake. He placed a 21 day event monitor and instructed her to take an extra metoprolol as needed.  The palpitations can occur at any time. They occur with rest and with exertion. They're not constant. The last 30-45 seconds and occur multiple times  through the day.  Sept. 16, 2015:  Still having palpitations. She has had more leg edema and her medical doctor on Lasix which he now takes about every other day. She was not on potassium replacement. This may be the cause of her increased PVCs.   May 24, 2014:  Sheila Reynolds is a 72 y.o. female who presents for follow up of her palpitations   Still has these palpitations.  No syncope.   Jan. 10, 2017:  Doing ok Still has DOE  Has palpitations on occasion. Not getting much exercise .  expalined a typical exercise program .   Start slow, gradually increase   May 07, 2016;   Sheila Reynolds is seen today for follow up .  Has been having more palpitations Seem to be getting worse  Went to there ER last week -  the ER note does not describe any specific arrhythmias. No passing out but she did get lightheaded when she had palpitations on one occasion   Has rare episodes of atypical CP .  Lasts a second Not associated with exertion .  Walks 1.5 miles 3-4 times a week .   This walking does not cause any palpitation of CP   July 17, 2016 Sheila Reynolds is seen today for follow up  30 day monitor showed no significant arrhythmias  Having leg pain and swelling  during night and day  Not worsened by exercise  No CP or dyspnea - still walking 1.5 miles 3-4 days a week   December 22, 2016:  Sheila Reynolds had a cath recently that showed a 70 % LAD stenosis . Has right arm pain but this is not exertional . Has pins and needles sensation Has DOE with stairs and carrying something heavy .  Has not really noticed any difference in how she feels on the new meds which include Imdur 30 mg , Plavix 75 ,  + cough, chronic ,  No hemoptysis. Does not exercise regularly .    January 29, 2017: Sheila Reynolds is seen today for a follow-up visit.  She recently had rotational atherectomy and stenting of her proximal LAD and mid LAD. Still has some pins and needles sensation Has been walking in the mall.   Does not have CP . Has some DOE with that   .  Jul 09, 2017:   Sheila Reynolds is doing well.  She is status post rotational atherectomy and stent placement to her proximal/mid LAD in December, 2018. Had left eye surgery in left eye.    Occasional CP ,  Thinks it might be indigestion .  Not associated with eating or drinking . Pain was on the left side of left breast.   Lasts a few minutes Did not have much pain prior to her PCI to LAD .  Has been exercising .   Exercise does not cause any CP .  Relieved with SL NTG   BP has been elevated.   Aug. 26, 2019: Following her visit in February, she was having some chest pain.  A stress Myoview study was performed and was found to be low risk.  She has normal left ventricular systolic function and no ischemia. She is feeling quite well.  She has not had any recurrent episodes of chest pain. She is exercising some .   Has been having bilateral leg pain and swelling.  She had an arterial Doppler performed for peripheral vascular disease.  As of today the study has not yet been read.  January 29, 2018: Sheila Reynolds is seen today for 2 reasons.  She is been having more shortness of breath than normal.  She also needs preoperative evaluation.  She has a history of coronary artery disease and is status post stenting of her LAD in December 2018. Is having more DOE with any activity , climbing stairs,  Similar to how she felt prior to stenting Resolves after 5- 10 min of rest.  Has not taken a NTG   Also needs to have eyelid  surgery  Scheduled for Dec. 31.     Past Medical History:  Diagnosis Date  . Anemia    3 months ago anemic  . Anxiety    on meds  . Arthritis    "all over" (01/15/2017)  . Asthma   . Bronchitis with emphysema   . Chest pain   . Chronic bronchitis ([place])   . Coronary artery disease    a. 01/2017 she underwent orbital atherectomy/DES to the proxmal LAD and PTCA to ostial D2. 2D Echo 01/15/17 showed mild LVH, EF 60-65%, grade 1  DD.  . Family history of anesthesia complication    daughter N/V  . Fibromyalgia   . GERD (gastroesophageal reflux disease)    on meds  . Heart murmur   . History of hiatal hernia   . Hx of echocardiogram    Echo (03/2013):  Tech limited; Mild focal basal septal hypertrophy, EF 60-65%, normal RVF  . Hyperlipidemia   . Hypertension   . Nonsustained ventricular tachycardia ([place])   . OSA on CPAP   .  Premature atrial contractions   . PVC (premature ventricular contraction)    a. Holter 12/16: NSR, occ PAC,PVCs  . Sarcoidosis   . Type II diabetes mellitus ([place])     Past Surgical History:  Procedure Laterality Date  . BREAST SURGERY    . CARDIAC CATHETERIZATION  05/04/2007   reveals overall normal left ventricular systolic function. Ejection fraction 65-70%  . CATARACT EXTRACTION W/PHACO Right 07/20/2013   Procedure: CATARACT EXTRACTION PHACO AND INTRAOCULAR LENS PLACEMENT (IOC);  Surgeon: Marylynn Pearson, MD;  Location: [facility];  Service: Ophthalmology;  Laterality: Right;  . COLONOSCOPY W/ BIOPSIES AND POLYPECTOMY    . COLONOSCOPY WITH PROPOFOL N/A 09/07/2014   Procedure: COLONOSCOPY WITH PROPOFOL;  Surgeon: Juanita Craver, MD;  Location: [facility];  Service: Endoscopy;  Laterality: N/A;  . CORONARY ANGIOPLASTY WITH STENT PLACEMENT  01/15/2017  . CORONARY ATHERECTOMY N/A 01/15/2017   Procedure: CORONARY ATHERECTOMY;  Surgeon: Nelva Bush, MD;  Location: [facility];  Service: Cardiovascular;  Laterality: N/A;  . CORONARY BALLOON ANGIOPLASTY N/A 01/15/2017   Procedure: CORONARY BALLOON ANGIOPLASTY;  Surgeon: Nelva Bush, MD;  Location: [place] [facility];  Service: Cardiovascular;  Laterality: N/A;  . CORONARY STENT INTERVENTION N/A 01/15/2017   Procedure: CORONARY STENT INTERVENTION;  Surgeon: Nelva Bush, MD;  Location: [place] [facility];  Service: Cardiovascular;  Laterality: N/A;  . ESOPHAGOGASTRODUODENOSCOPY (EGD) WITH PROPOFOL N/A 09/07/2014   Procedure:  ESOPHAGOGASTRODUODENOSCOPY (EGD) WITH PROPOFOL;  Surgeon: Juanita Craver, MD;  Location: [facility];  Service: Endoscopy;  Laterality: N/A;  . EXTERNAL EAR SURGERY Bilateral 1970s   tumors removed  . EYE SURGERY Left 2019   cataract extraction   . INTRAVASCULAR PRESSURE WIRE/FFR STUDY N/A 12/12/2016   Procedure: INTRAVASCULAR PRESSURE WIRE/FFR STUDY;  Surgeon: Nelva Bush, MD;  Location: [facility];  Service: Cardiovascular;  Laterality: N/A;  . MINI SHUNT INSERTION Right 07/20/2013   Procedure: INSERTION OF GLAUCOMA FILTRATION DEVICE RIGHT EYE;  Surgeon: Marylynn Pearson, MD;  Location: [facility];  Service: Ophthalmology;  Laterality: Right;  . MITOMYCIN C APPLICATION Right 9/62/2297   Procedure: MITOMYCIN C APPLICATION;  Surgeon: Marylynn Pearson, MD;  Location: [facility];  Service: Ophthalmology;  Laterality: Right;  . MITOMYCIN C APPLICATION Right 9/89/2119   Procedure: MITOMYCIN C APPLICATION RIGHT EYE;  Surgeon: Marylynn Pearson, MD;  Location: [facility];  Service: Ophthalmology;  Laterality: Right;  . PLACEMENT OF BREAST IMPLANTS Bilateral 1992   "took all my breast tissue out; put implants in;fibrocystic breast disease "  . RIGHT/LEFT HEART CATH AND CORONARY ANGIOGRAPHY N/A 12/12/2016   Procedure: RIGHT/LEFT HEART CATH AND CORONARY ANGIOGRAPHY;  Surgeon: Nelva Bush, MD;  Location: [place] [facility];  Service: Cardiovascular;  Laterality: N/A;  . TONSILLECTOMY    . TOTAL ABDOMINAL HYSTERECTOMY  1982  . TRABECULECTOMY Right 02/21/2015   Procedure: TRABECULECTOMY WITH [facility] ON THE RIGHT EYE;  Surgeon: Marylynn Pearson, MD;  Location: [facility];  Service: Ophthalmology;  Laterality: Right;     Current Outpatient Medications  Medication Sig Dispense Refill  . acetaminophen (TYLENOL) 500 MG tablet Take 1,000 mg by mouth 2 (two) times daily as needed for moderate pain or headache.    . albuterol (PROVENTIL HFA;VENTOLIN HFA) 108 (90 Base) MCG/ACT inhaler Inhale 2 puffs into the lungs every 6 (six) hours as  needed for wheezing or shortness of breath. 1 Inhaler 12  . albuterol (PROVENTIL) (2.5 MG/3ML) 0.083% nebulizer solution Take 2.5 mg by nebulization daily as needed for wheezing or shortness of breath.     [name]  aspirin EC 81 MG tablet Take 81 mg by mouth at bedtime.     [name] atorvastatin (LIPITOR) 40 MG tablet TAKE 1 TABLET(40 MG) BY MOUTH DAILY 30 tablet 8  . brimonidine (ALPHAGAN P) 0.1 % SOLN Place 1 drop into the left eye 3 (three) times daily.     . brinzolamide (AZOPT) 1 % ophthalmic suspension Place 1 drop into the left eye 3 (three) times daily.     . carvedilol (COREG) 12.5 MG tablet Take 1 tablet (12.5 mg total) by mouth 2 (two) times daily. 180 tablet 3  . clopidogrel (PLAVIX) 75 MG tablet Take 1 tablet (75 mg total) by mouth daily. 90 tablet 3  . folic acid (FOLVITE) 1 MG tablet Take 2 tablets (2 mg total) by mouth daily. 180 tablet 3  . furosemide (LASIX) 20 MG tablet TAKE 1 TABLET BY MOUTH EVERY 3 DAYS 45 tablet 3  . guaiFENesin (ROBITUSSIN) 100 MG/5ML SOLN Take 5 mLs by mouth every 6 (six) hours as needed for cough or to loosen phlegm.     . isosorbide mononitrate (IMDUR) 60 MG 24 hr tablet Take 1 tablet (60 mg total) by mouth daily. 90 tablet 3  . loteprednol (LOTEMAX) 0.5 % ophthalmic suspension See admin instructions. Instill 8 DROPS IN LEFT EYE 3 IN RIGHT EYE DAILY    . methotrexate (RHEUMATREX) 2.5 MG tablet Take 8 tablets (20 mg total) by mouth once a week. Caution:Chemotherapy. Protect from light. 96 tablet 0  . montelukast (SINGULAIR) 10 MG tablet Take 10 mg by mouth at bedtime.    . nitroGLYCERIN (NITROSTAT) 0.4 MG SL tablet Place 1 tablet (0.4 mg total) under the tongue every 5 (five) minutes as needed for chest pain. 25 tablet prn  . olmesartan (BENICAR) 20 MG tablet Take 20 mg by mouth daily.    [name] OVER THE COUNTER MEDICATION     . pantoprazole (PROTONIX) 40 MG tablet TAKE 1 TABLET(40 MG) BY MOUTH DAILY 30 tablet 9  . Polyvinyl Alcohol-Povidone (REFRESH OP) Apply to eye daily.     . Pregabalin ER (LYRICA CR) 165 MG TB24 Take 1 tablet by mouth 3 (three) times daily.     . sitaGLIPtin-metformin (JANUMET) 50-500 MG tablet Take 1 tablet by mouth 2 (two) times daily with a meal.    . traMADol (ULTRAM) 50 MG tablet Take 1 tablet (50 mg total) by mouth every 6 (six) hours as needed. 40 tablet 0  . travoprost, benzalkonium, (TRAVATAN) 0.004 % ophthalmic solution Place 1 drop into the right eye at bedtime.      No current facility-administered medications for this visit.     Allergies:   Crestor [rosuvastatin calcium]; Demerol  [meperidine hcl]; Shellfish allergy; and Demerol [meperidine]    Social History:  The patient  reports that she quit smoking about 37 years ago. Her smoking use included cigarettes. She has a 4.00 pack-year smoking history. She has never used smokeless tobacco. She reports that she does not drink alcohol or use drugs.   Family History:  The patient's family history includes Cancer in her mother and paternal grandmother; Diabetes in her brother and father; Glaucoma in her brother and father; Heart disease in her father and paternal grandmother.    ROS: Noted in current history, otherwise review of systems is negative.  Physical Exam: Blood pressure 130/82, pulse 70, height 5\' 2"  (1.575 m), weight 174 lb (78.9 kg), SpO2 98 %.  GEN: Elderly female, no acute distress HEENT: Normal NECK: No JVD; No carotid  bruits LYMPHATICS: No lymphadenopathy CARDIAC: RRR  RESPIRATORY:  Clear to auscultation without rales, wheezing or rhonchi  ABDOMEN: Soft, non-tender, non-distended MUSCULOSKELETAL:  No edema; No deformity  SKIN: Warm and dry NEUROLOGIC:  Alert and oriented x 3    EKG:   January 29, 2018: Normal sinus rhythm at 70 beats a minute.  No ST or T wave changes.  Recent Labs: 03/31/2017: TSH 1.100 04/20/2017: Magnesium 2.5 12/14/2017: Hemoglobin 11.6; Platelets 201 01/13/2018: ALT 13; BUN 10; Creatinine, Ser 0.75; Potassium 4.4; Sodium 140     Lipid Panel    Component Value Date/Time   CHOL 129 04/09/2017 1107   TRIG 87 04/09/2017 1107   HDL 44 04/09/2017 1107   CHOLHDL 2.9 04/09/2017 1107   CHOLHDL 3 05/24/2014 1650   VLDL 20.8 05/24/2014 1650   LDLCALC 68 04/09/2017 1107      Wt Readings from Last 3 Encounters:  01/29/18 174 lb (78.9 kg)  01/13/18 174 lb (78.9 kg)  12/21/17 177 lb 9.6 oz (80.6 kg)      Other studies Reviewed: Additional studies/ records that were reviewed today include: . Review of the above records demonstrates:    ASSESSMENT AND PLAN:   1.   Coronary artery disease:    Sheila Reynolds is status post stenting approximately 1 year ago.  She continues to have episodes of shortness of breath.  She thinks these episodes have worsened recently.  She thinks these are similar to her symptoms prior to stenting. Gradual her for a [employer] study for further evaluation.  Needs to have eyelid surgery on her right eyelid.  Eyelid surgery is very low risk surgery and she is cleared to have her eyelid surgery.  We will get a [place] study to make sure that she does not have a critical stenosis that we know that it safe to stop her Plavix for 5 days.  See her in 3 months for follow-up visit.   2. Hyperlipidemia -  Labs in Feb look great. Will ch3eck again in 3 months   4. Nonsustained ventricular tachycardia-    5.  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 Keturah Barre   Current medicines are reviewed at length with the patient today.  The patient does not have concerns regarding medicines.  The following changes have been made:  no change  Labs/ tests ordered today include:   No orders of the defined types were placed in this encounter.    Disposition:   FU with me in 6 months   Signed, Mertie Moores, MD  01/29/2018 8:59 AM    [facility] [address], [place], [place]  [address] Phone: [phone]; Fax: [phone]

## 2018-01-29 NOTE — Telephone Encounter (Signed)
Patient given detailed instructions per Myocardial Perfusion Study Information Sheet for the test on 02/02/18 at 7:15. Patient notified to arrive 15 minutes early and that it is imperative to arrive on time for appointment to keep from having the test rescheduled.  If you need to cancel or reschedule your appointment, please call the office within 24 hours of your appointment. . Patient verbalized understanding.Sheila Reynolds

## 2018-01-29 NOTE — Patient Instructions (Signed)
Medication Instructions:  Your physician recommends that you continue on your current medications as directed. Please refer to the Current Medication list given to you today.  If you need a refill on your cardiac medications before your next appointment, please call your pharmacy.    Lab work: None Ordered   Testing/Procedures: Your physician has requested that you have a lexiscan myoview. For further information please visit [email or web address]. Please follow instruction sheet, as given.    Follow-Up: At [facility], you and your health needs are our priority.  As part of our continuing mission to provide you with exceptional heart care, we have created designated Provider Care Teams.  These Care Teams include your primary Cardiologist (physician) and Advanced Practice Providers (APPs -  Physician Assistants and Nurse Practitioners) who all work together to provide you with the care you need, when you need it. You will need a follow up appointment in:  3 months.  You may see Mertie Moores, MD or one of the following Advanced Practice Providers on your designated Care Team: Richardson Dopp, PA-C [place], [place] . Daune Perch, NP

## 2018-02-02 ENCOUNTER — Encounter (INDEPENDENT_AMBULATORY_CARE_PROVIDER_SITE_OTHER): Payer: Self-pay

## 2018-02-02 ENCOUNTER — Ambulatory Visit (HOSPITAL_COMMUNITY): Payer: Medicare Other | Attending: Internal Medicine

## 2018-02-02 ENCOUNTER — Telehealth: Payer: Self-pay | Admitting: Cardiovascular Disease

## 2018-02-02 DIAGNOSIS — I2510 Atherosclerotic heart disease of native coronary artery without angina pectoris: Secondary | ICD-10-CM | POA: Insufficient documentation

## 2018-02-02 DIAGNOSIS — R0609 Other forms of dyspnea: Secondary | ICD-10-CM | POA: Diagnosis not present

## 2018-02-02 DIAGNOSIS — R0600 Dyspnea, unspecified: Secondary | ICD-10-CM

## 2018-02-02 LAB — MYOCARDIAL PERFUSION IMAGING
LV dias vol: 53 mL (ref 46–106)
LV sys vol: 17 mL
NUC STRESS TID: 0.99
Peak HR: 114 {beats}/min
Rest HR: 72 {beats}/min
SDS: 5
SRS: 4
SSS: 9

## 2018-02-02 MED ORDER — TECHNETIUM TC 99M TETROFOSMIN IV KIT
10.1000 | PACK | Freq: Once | INTRAVENOUS | Status: AC | PRN
  Administered 2018-02-02: 10.1 via INTRAVENOUS
  Filled 2018-02-02: qty 11

## 2018-02-02 MED ORDER — REGADENOSON 0.4 MG/5ML IV SOLN
0.4000 mg | Freq: Once | INTRAVENOUS | Status: AC
  Administered 2018-02-02: 0.4 mg via INTRAVENOUS

## 2018-02-02 MED ORDER — TECHNETIUM TC 99M TETROFOSMIN IV KIT
32.6000 | PACK | Freq: Once | INTRAVENOUS | Status: AC | PRN
  Administered 2018-02-02: 32.6 via INTRAVENOUS
  Filled 2018-02-02: qty 33

## 2018-02-02 NOTE — Telephone Encounter (Signed)
Seen by Dr. Acie Fredrickson 12/20 Nuclear stress test scheduled for shortness of breath.  Study was done 02/02/2018. Preop pool can follow up once Nuclear stress test is complete. Richardson Dopp, PA-C    02/02/2018 12:49 PM

## 2018-02-02 NOTE — Telephone Encounter (Signed)
Follow Up:     Marcene Brawn calling to check on status of pt's clearance/  Pt is scheduled fro surgery on 02-09-18. Please put in Epic or fax to [phone].

## 2018-02-04 NOTE — Telephone Encounter (Signed)
Pt had a low risk myoview She is at low risk for her upcoming eyelid surgery  She may hold ASA and Plavix for 5-7  days prior to her procedure.     Mertie Moores, MD  02/04/2018 11:57 AM    [address],  [place], [place]  [address] Pager [phone] Phone: [phone]; Fax: [phone]

## 2018-02-04 NOTE — Telephone Encounter (Signed)
   Primary Cardiologist: Mertie Moores, MD  Chart reviewed as part of pre-operative protocol coverage. Patient was contacted 02/04/2018 in reference to pre-operative risk assessment for pending surgery as outlined below.  Sheila Reynolds was last seen on 01/29/2018 by Dr. Acie Fredrickson.  Since that day, Sheila Reynolds has done well. Her recent stress test showed normal EF without ischemia.   Therefore, based on ACC/AHA guidelines, the patient would be at acceptable risk for the planned procedure without further cardiovascular testing.   I will route this recommendation to the requesting party via Epic fax function and remove from pre-op pool.  Please call with questions. She is cleared to hold aspirin and plavix for 5 to 7 days prior to the procedure and restart them after the procedure as soon as possible. I have discussed this with the patient, the last aspirin she took was 2 days ago and her last plavix was yesterday. She will continue to hold them until her surgery on 12/31 which should be enough time.   [place], [place] 02/04/2018, 3:05 PM

## 2018-02-07 NOTE — Assessment & Plan Note (Signed)
This is expected to be burned out by now. Plan-ACE level

## 2018-02-07 NOTE — Assessment & Plan Note (Signed)
We discussed comfort, medical importance of CPAP, and compliance goals. -Continue CPAP 9

## 2018-02-07 NOTE — Assessment & Plan Note (Signed)
Sustained exacerbation through the summer months, nonspecific.  I do not believe this is an exacerbation of her sarcoid, which is probably burned out now. Plan-flu vaccine and Pneumovax updated.  She asks shingles vaccine so we gave prescription for drugstore.  CXR

## 2018-02-07 NOTE — Assessment & Plan Note (Signed)
Plan- CXR 

## 2018-02-07 NOTE — Assessment & Plan Note (Addendum)
She is not describing an obvious anginal pattern and symptoms are more consistent with musculoskeletal pain.  If symptoms progress she is to see her cardiologist or PCP.[name]

## 2018-02-09 DIAGNOSIS — I2510 Atherosclerotic heart disease of native coronary artery without angina pectoris: Secondary | ICD-10-CM | POA: Diagnosis not present

## 2018-02-09 DIAGNOSIS — Z79899 Other long term (current) drug therapy: Secondary | ICD-10-CM | POA: Diagnosis not present

## 2018-02-09 DIAGNOSIS — Z87891 Personal history of nicotine dependence: Secondary | ICD-10-CM | POA: Diagnosis not present

## 2018-02-09 DIAGNOSIS — Z888 Allergy status to other drugs, medicaments and biological substances status: Secondary | ICD-10-CM | POA: Diagnosis not present

## 2018-02-09 DIAGNOSIS — J45909 Unspecified asthma, uncomplicated: Secondary | ICD-10-CM | POA: Diagnosis not present

## 2018-02-09 DIAGNOSIS — Z955 Presence of coronary angioplasty implant and graft: Secondary | ICD-10-CM | POA: Diagnosis not present

## 2018-02-09 DIAGNOSIS — H401122 Primary open-angle glaucoma, left eye, moderate stage: Secondary | ICD-10-CM | POA: Diagnosis not present

## 2018-02-09 DIAGNOSIS — Z91013 Allergy to seafood: Secondary | ICD-10-CM | POA: Diagnosis not present

## 2018-02-09 DIAGNOSIS — E1139 Type 2 diabetes mellitus with other diabetic ophthalmic complication: Secondary | ICD-10-CM | POA: Diagnosis not present

## 2018-02-09 DIAGNOSIS — H02421 Myogenic ptosis of right eyelid: Secondary | ICD-10-CM | POA: Diagnosis not present

## 2018-02-09 DIAGNOSIS — I10 Essential (primary) hypertension: Secondary | ICD-10-CM | POA: Diagnosis not present

## 2018-02-09 DIAGNOSIS — Z7984 Long term (current) use of oral hypoglycemic drugs: Secondary | ICD-10-CM | POA: Diagnosis not present

## 2018-02-09 DIAGNOSIS — K219 Gastro-esophageal reflux disease without esophagitis: Secondary | ICD-10-CM | POA: Diagnosis not present

## 2018-02-09 DIAGNOSIS — Z8614 Personal history of Methicillin resistant Staphylococcus aureus infection: Secondary | ICD-10-CM | POA: Diagnosis not present

## 2018-02-09 DIAGNOSIS — Z7902 Long term (current) use of antithrombotics/antiplatelets: Secondary | ICD-10-CM | POA: Diagnosis not present

## 2018-02-09 DIAGNOSIS — Z7982 Long term (current) use of aspirin: Secondary | ICD-10-CM | POA: Diagnosis not present

## 2018-02-09 DIAGNOSIS — G4730 Sleep apnea, unspecified: Secondary | ICD-10-CM | POA: Diagnosis not present

## 2018-02-09 HISTORY — PX: EYE SURGERY: SHX253

## 2018-03-02 NOTE — Progress Notes (Signed)
Office Visit Note  Patient: Sheila Reynolds             Date of Birth: Jun 01, 1945           MRN: [id number]             PCP: Glendale Chard, MD Referring: Glendale Chard, MD Visit Date: 03/16/2018 Occupation: @GUAROCC @  Subjective:  Pain in left leg.   History of Present Illness: Sheila Reynolds is a 73 y.o. female history of sarcoidosis uveitis and degenerative disc disease.  She states that she had right eye surgery for ptosis.  She has no episodes of uveitis recently.  She states she has been having increased fluid retention in her bilateral lower extremities.  She is also seeing a cardiologist and had a stress test which was negative.  She has appointment coming up with her PCP.  She has been also experiencing pain in her left knee and left lower extremity.  She states pain sometimes goes to her hip.  Continues to have neck pain and limited range of motion.  Lower back pain is tolerable.  Activities of Daily Living:  Patient reports morning stiffness for 1 hour.   Patient Reports nocturnal pain.  Difficulty dressing/grooming: Reports Difficulty climbing stairs: Reports Difficulty getting out of chair: Reports Difficulty using hands for taps, buttons, cutlery, and/or writing: Denies  Review of Systems  Constitutional: Positive for fatigue. Negative for night sweats, weight gain and weight loss.  HENT: Positive for mouth dryness. Negative for mouth sores, trouble swallowing, trouble swallowing and nose dryness.   Eyes: Negative for pain, redness, visual disturbance and dryness.  Respiratory: Positive for shortness of breath. Negative for cough and difficulty breathing.        Cardiology w/u negative per patient  Cardiovascular: Negative for chest pain, palpitations, hypertension, irregular heartbeat and swelling in legs/feet.  Gastrointestinal: Negative for blood in stool, constipation and diarrhea.  Endocrine: Negative for increased urination.  Genitourinary: Negative for  vaginal dryness.  Musculoskeletal: Positive for arthralgias, joint pain, myalgias, morning stiffness and myalgias. Negative for joint swelling, muscle weakness and muscle tenderness.  Skin: Negative for color change, rash, hair loss, skin tightness, ulcers and sensitivity to sunlight.  Allergic/Immunologic: Negative for susceptible to infections.  Neurological: Negative for dizziness, memory loss, night sweats and weakness.  Hematological: Negative for swollen glands.  Psychiatric/Behavioral: Positive for sleep disturbance. Negative for depressed mood. The patient is nervous/anxious.     PMFS History:  Patient Active Problem List   Diagnosis Date Noted  . 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 Fibromyalgia 06/01/2017  . DDD (degenerative disc disease), lumbar 06/01/2017  . 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 History of hypercholesterolemia 06/01/2017  . History of anxiety 06/01/2017  . History of IBS 06/01/2017  . Premature atrial contractions 03/30/2017  . PVC's (premature ventricular contractions) 03/30/2017  . Diabetes mellitus without complication ([place]) 73/53/2992  . Angina pectoris ([place]) 01/15/2017  . CAD (coronary artery disease) 01/12/2017  . Shortness of breath 12/12/2016  . Acute maxillary sinusitis 05/03/2014  . Obstructive sleep apnea 11/04/2013  . Multiple lung nodules 07/18/2012  . Heart palpitations 03/11/2012  . HTN (hypertension) 03/11/2012  . GERD (gastroesophageal reflux disease) 07/13/2011  . Sarcoidosis 02/08/2011  . Chronic bronchitis ([place]) 02/05/2011  . Hyperlipemia 09/24/2010  . Atypical chest pain 09/24/2010  . NSVT (nonsustained ventricular tachycardia) ([place]) 09/24/2010  . Leg edema 09/24/2010    Past Medical History:  Diagnosis Date  . Anemia    3 months ago  anemic  . Anxiety    on meds  . Arthritis    "all over" (01/15/2017)  . Asthma   . Bronchitis with emphysema   . Chest pain   . Chronic bronchitis ([place])   . Coronary artery disease     a. 01/2017 she underwent orbital atherectomy/DES to the proxmal LAD and PTCA to ostial D2. 2D Echo 01/15/17 showed mild LVH, EF 60-65%, grade 1 DD.  . Family history of anesthesia complication    daughter N/V  . Fibromyalgia   . GERD (gastroesophageal reflux disease)    on meds  . Heart murmur   . History of hiatal hernia   . Hx of echocardiogram    Echo (03/2013):  Tech limited; Mild focal basal septal hypertrophy, EF 60-65%, normal RVF  . Hyperlipidemia   . Hypertension   . Nonsustained ventricular tachycardia ([place])   . OSA on CPAP   . Premature atrial contractions   . PVC (premature ventricular contraction)    a. Holter 12/16: NSR, occ PAC,PVCs  . Sarcoidosis   . Type II diabetes mellitus ([facility])     Family History  Problem Relation Age of Onset  . Heart disease Father   . Diabetes Father   . Glaucoma Father   . Cancer Mother        unknown type, ?lung  . Heart disease Paternal Grandmother   . Cancer Paternal Grandmother        unknown type  . Diabetes Brother   . Glaucoma Brother    Past Surgical History:  Procedure Laterality Date  . BREAST SURGERY    . CARDIAC CATHETERIZATION  05/04/2007   reveals overall normal left ventricular systolic function. Ejection fraction 65-70%  . CATARACT EXTRACTION W/PHACO Right 07/20/2013   Procedure: CATARACT EXTRACTION PHACO AND INTRAOCULAR LENS PLACEMENT (IOC);  Surgeon: Marylynn Pearson, MD;  Location: [facility];  Service: Ophthalmology;  Laterality: Right;  . COLONOSCOPY W/ BIOPSIES AND POLYPECTOMY    . COLONOSCOPY WITH PROPOFOL N/A 09/07/2014   Procedure: COLONOSCOPY WITH PROPOFOL;  Surgeon: Juanita Craver, MD;  Location: [facility];  Service: Endoscopy;  Laterality: N/A;  . CORONARY ANGIOPLASTY WITH STENT PLACEMENT  01/15/2017  . CORONARY ATHERECTOMY N/A 01/15/2017   Procedure: CORONARY ATHERECTOMY;  Surgeon: Nelva Bush, MD;  Location: [facility];  Service: Cardiovascular;  Laterality: N/A;  . CORONARY BALLOON ANGIOPLASTY N/A  01/15/2017   Procedure: CORONARY BALLOON ANGIOPLASTY;  Surgeon: Nelva Bush, MD;  Location: [facility];  Service: Cardiovascular;  Laterality: N/A;  . CORONARY STENT INTERVENTION N/A 01/15/2017   Procedure: CORONARY STENT INTERVENTION;  Surgeon: Nelva Bush, MD;  Location: [facility];  Service: Cardiovascular;  Laterality: N/A;  . ESOPHAGOGASTRODUODENOSCOPY (EGD) WITH PROPOFOL N/A 09/07/2014   Procedure: ESOPHAGOGASTRODUODENOSCOPY (EGD) WITH PROPOFOL;  Surgeon: Juanita Craver, MD;  Location: [facility];  Service: Endoscopy;  Laterality: N/A;  . EXTERNAL EAR SURGERY Bilateral 1970s   tumors removed  . EYE SURGERY Left 2019   cataract extraction   . EYE SURGERY Right 02/09/2018   eyelid surgery   . INTRAVASCULAR PRESSURE WIRE/FFR STUDY N/A 12/12/2016   Procedure: INTRAVASCULAR PRESSURE WIRE/FFR STUDY;  Surgeon: Nelva Bush, MD;  Location: [facility];  Service: Cardiovascular;  Laterality: N/A;  . MINI SHUNT INSERTION Right 07/20/2013   Procedure: INSERTION OF GLAUCOMA FILTRATION DEVICE RIGHT EYE;  Surgeon: Marylynn Pearson, MD;  Location: [facility];  Service: Ophthalmology;  Laterality: Right;  . MITOMYCIN C APPLICATION Right 0/45/4098   Procedure: MITOMYCIN  C APPLICATION;  Surgeon: Marylynn Pearson, MD;  Location: [facility];  Service: Ophthalmology;  Laterality: Right;  . MITOMYCIN C APPLICATION Right 04/03/9796   Procedure: MITOMYCIN C APPLICATION RIGHT EYE;  Surgeon: Marylynn Pearson, MD;  Location: [facility];  Service: Ophthalmology;  Laterality: Right;  . PLACEMENT OF BREAST IMPLANTS Bilateral 1992   "took all my breast tissue out; put implants in;fibrocystic breast disease "  . RIGHT/LEFT HEART CATH AND CORONARY ANGIOGRAPHY N/A 12/12/2016   Procedure: RIGHT/LEFT HEART CATH AND CORONARY ANGIOGRAPHY;  Surgeon: Nelva Bush, MD;  Location: [facility];  Service: Cardiovascular;  Laterality: N/A;  . TONSILLECTOMY    . TOTAL ABDOMINAL HYSTERECTOMY  1982  . TRABECULECTOMY  Right 02/21/2015   Procedure: TRABECULECTOMY WITH [facility] ON THE RIGHT EYE;  Surgeon: Marylynn Pearson, MD;  Location: [facility];  Service: Ophthalmology;  Laterality: Right;   Social History   Social History Narrative  . Not on file   Immunization History  Administered Date(s) Administered  . Influenza Split 10/31/2013  . Influenza, High Dose Seasonal PF 11/18/2016, 12/21/2017  . Influenza,inj,Quad PF,6+ Mos 02/25/2013, 03/19/2015  . Pneumococcal Polysaccharide-23 12/21/2017  . Zoster Recombinat (Shingrix) 12/25/2017     Objective: Vital Signs: BP (!) 152/86 (BP Location: Right Arm, Patient Position: Sitting, Cuff Size: Normal)   Pulse 80   Resp 13   Ht 5\' 2"  (1.575 m)   Wt 174 lb (78.9 kg)   BMI 31.83 kg/m    Physical Exam Vitals signs and nursing note reviewed.  Constitutional:      Appearance: She is well-developed.  HENT:     Head: Normocephalic and atraumatic.  Eyes:     Conjunctiva/sclera: Conjunctivae normal.  Neck:     Musculoskeletal: Normal range of motion.  Cardiovascular:     Rate and Rhythm: Normal rate and regular rhythm.     Heart sounds: Normal heart sounds.     Comments: bilateral pedal edema up to calfs more prominent in left lower extremity than right.  She also had left calf tenderness. Pulmonary:     Effort: Pulmonary effort is normal.     Breath sounds: Normal breath sounds.  Abdominal:     General: Bowel sounds are normal.     Palpations: Abdomen is soft.  Lymphadenopathy:     Cervical: No cervical adenopathy.  Skin:    General: Skin is warm and dry.     Capillary Refill: Capillary refill takes less than 2 seconds.  Neurological:     Mental Status: She is alert and oriented to person, place, and time.  Psychiatric:        Behavior: Behavior normal.      Musculoskeletal Exam: C-spine limited range of motion with discomfort.  Lumbar spine and thoracic spine limited range of motion with discomfort.  She had limited range of motion of her right  shoulder joint with discomfort due to underlying tendinopathy.  Elbow joints wrist joint MCPs PIPs with good range of motion.  She had good range of motion of her hip joints.  She has painful range of motion of her left knee joint.  Without any warmth swelling or effusion.  She has significant pedal edema over bilateral lower extremities more so prominent in the left leg.  She has left calf tenderness.  CDAI Exam: CDAI Score: Not documented Patient Global Assessment: Not documented; Provider Global Assessment: Not documented Swollen: Not documented; Tender: Not documented Joint Exam   Not documented   There is currently no information documented on  the homunculus. Go to the Rheumatology activity and complete the homunculus joint exam.  Investigation: No additional findings.  Imaging: No results found.  Recent Labs: Lab Results  Component Value Date   WBC 7.4 01/29/2018   HGB 11.3 (L) 01/29/2018   PLT 224 01/29/2018   NA 141 01/29/2018   K 4.0 01/29/2018   CL 104 01/29/2018   CO2 28 01/29/2018   GLUCOSE 81 01/29/2018   BUN 14 01/29/2018   CREATININE 0.77 01/29/2018   BILITOT 0.3 01/29/2018   ALKPHOS 100 01/13/2018   AST 14 01/29/2018   ALT 13 01/29/2018   PROT 6.5 01/29/2018   ALBUMIN 4.4 01/13/2018   CALCIUM 9.5 01/29/2018   GFRAA 91 01/29/2018   QFTBGOLDPLUS NEGATIVE 12/14/2017    Speciality Comments: No specialty comments available.  Procedures:  No procedures performed Allergies: Crestor [rosuvastatin calcium]; Demerol  [meperidine hcl]; Shellfish allergy; and Demerol [meperidine]   Assessment / Plan:     Visit Diagnoses: Sarcoidosis - Dr. Janee Morn most recent note on 06/21/17 sarcoidosis is clinically inactive but there is residual scarring. 12/21/17 appt with Dr. Annamaria Boots.  Uveitis -patient denies any episodes of uveitis since the last visit.  She is had good response to methotrexate.  I had detailed discussion with Dr. Randell Loop over the phone after patient's last  visit.  He mentioned that patient has been having recurrent uveitis.  High risk medication use - MTX, folic acid.  - Plan: CBC with Differential/Platelet, COMPLETE METABOLIC PANEL WITH GFR today and every 3 months.  DDD (degenerative disc disease), cervical she has limited range of motion which causes discomfort.  DDD (degenerative disc disease), lumbar-she has chronic pain which is tolerable at this point.  DDD (degenerative disc disease), thoracic  Pedal edema-if he can pedal edema over bilateral lower extremity.  Left lower extremity edema is much more prominent.  She has calf tenderness.  I will schedule ultrasound to rule out DVT.  Primary open angle glaucoma (POAG) of left eye, severe stage - followed by ophthalmology.  Primary open angle glaucoma (POAG) of right eye, moderate stage  Fibromyalgia-he continues to have some generalized pain discomfort and positive tender points.  History of hypertension  History of hypercholesterolemia  History of gastroesophageal reflux (GERD)  History of IBS  History of diabetes mellitus  History of anxiety  Coronary artery disease    Orders: Orders Placed This Encounter  Procedures  . CBC with Differential/Platelet  . COMPLETE METABOLIC PANEL WITH GFR   No orders of the defined types were placed in this encounter.   Face-to-face time spent with patient was 30 minutes. Greater than 50% of time was spent in counseling and coordination of care.  Follow-Up Instructions: Return in about 3 months (around 06/14/2018) for Sarcoidosis, Uveitis, DDD , FMS.   Bo Merino, MD  Note - This record has been created using Editor, commissioning.  Chart creation errors have been sought, but may not always  have been located. Such creation errors do not reflect on  the standard of medical care.

## 2018-03-05 ENCOUNTER — Telehealth: Payer: Self-pay | Admitting: Rheumatology

## 2018-03-05 NOTE — Telephone Encounter (Signed)
Patient needs to know when she is due for labs again. Patient also needs to know about her second Shingrex injection. Sheila Reynolds does not want to give her the second dose to being on MTX. Please call patient to advise.

## 2018-03-05 NOTE — Telephone Encounter (Signed)
Returned patient's call.  She wants to know when she should come for labs.  She increaded her dose of methotrexate to 8 tablets on 01/27/18.  Instructed patient to come in next week to assess how she is tolerating the dose increase.  Then she can come in every 3 months.  She also asked about the second Shingrix vaccine.  She was told to double check with our office prior to getting since methotrexate can decrease the immunity she builds up to the vaccine. They recommended holding her methotrexate for 4 weeks.  Advised patient that the decrease in the immunity is very minimal and recommendations say not to delay or interrupt therapy.  Will do more research and confer with Dr. Estanislado Pandy.

## 2018-03-08 DIAGNOSIS — H2013 Chronic iridocyclitis, bilateral: Secondary | ICD-10-CM | POA: Diagnosis not present

## 2018-03-08 DIAGNOSIS — H401122 Primary open-angle glaucoma, left eye, moderate stage: Secondary | ICD-10-CM | POA: Diagnosis not present

## 2018-03-08 DIAGNOSIS — H401113 Primary open-angle glaucoma, right eye, severe stage: Secondary | ICD-10-CM | POA: Diagnosis not present

## 2018-03-08 NOTE — Telephone Encounter (Signed)
Conferred with Dr. Estanislado Pandy and she agreed that the patient should go ahead and get the vaccine and not interrupt methotrexate therapy.  Patient verbalized understanding.  Also reminded that she is due for labs.

## 2018-03-16 ENCOUNTER — Ambulatory Visit (HOSPITAL_COMMUNITY)
Admission: RE | Admit: 2018-03-16 | Discharge: 2018-03-16 | Disposition: A | Discharge status: Home / Self Care | Payer: Medicare Other | Source: Ambulatory Visit | Attending: Rheumatology | Admitting: Rheumatology

## 2018-03-16 ENCOUNTER — Ambulatory Visit (INDEPENDENT_AMBULATORY_CARE_PROVIDER_SITE_OTHER): Payer: Medicare Other | Admitting: Rheumatology

## 2018-03-16 ENCOUNTER — Encounter: Payer: Self-pay | Admitting: Rheumatology

## 2018-03-16 VITALS — BP 152/86 | HR 80 | Resp 13 | Ht 62.0 in | Wt 174.0 lb

## 2018-03-16 DIAGNOSIS — M5030 Other cervical disc degeneration, unspecified cervical region: Secondary | ICD-10-CM

## 2018-03-16 DIAGNOSIS — Z8659 Personal history of other mental and behavioral disorders: Secondary | ICD-10-CM

## 2018-03-16 DIAGNOSIS — Z8639 Personal history of other endocrine, nutritional and metabolic disease: Secondary | ICD-10-CM | POA: Diagnosis not present

## 2018-03-16 DIAGNOSIS — D869 Sarcoidosis, unspecified: Secondary | ICD-10-CM

## 2018-03-16 DIAGNOSIS — M79669 Pain in unspecified lower leg: Secondary | ICD-10-CM

## 2018-03-16 DIAGNOSIS — M51369 Other intervertebral disc degeneration, lumbar region without mention of lumbar back pain or lower extremity pain: Secondary | ICD-10-CM

## 2018-03-16 DIAGNOSIS — R600 Localized edema: Secondary | ICD-10-CM

## 2018-03-16 DIAGNOSIS — Z79899 Other long term (current) drug therapy: Secondary | ICD-10-CM

## 2018-03-16 DIAGNOSIS — M797 Fibromyalgia: Secondary | ICD-10-CM

## 2018-03-16 DIAGNOSIS — Z8719 Personal history of other diseases of the digestive system: Secondary | ICD-10-CM

## 2018-03-16 DIAGNOSIS — Z8679 Personal history of other diseases of the circulatory system: Secondary | ICD-10-CM

## 2018-03-16 DIAGNOSIS — M5136 Other intervertebral disc degeneration, lumbar region: Secondary | ICD-10-CM

## 2018-03-16 DIAGNOSIS — M79662 Pain in left lower leg: Secondary | ICD-10-CM | POA: Insufficient documentation

## 2018-03-16 DIAGNOSIS — H209 Unspecified iridocyclitis: Secondary | ICD-10-CM | POA: Diagnosis not present

## 2018-03-16 DIAGNOSIS — H401112 Primary open-angle glaucoma, right eye, moderate stage: Secondary | ICD-10-CM

## 2018-03-16 DIAGNOSIS — M5134 Other intervertebral disc degeneration, thoracic region: Secondary | ICD-10-CM

## 2018-03-16 DIAGNOSIS — H401123 Primary open-angle glaucoma, left eye, severe stage: Secondary | ICD-10-CM | POA: Diagnosis not present

## 2018-03-16 DIAGNOSIS — I25119 Atherosclerotic heart disease of native coronary artery with unspecified angina pectoris: Secondary | ICD-10-CM

## 2018-03-16 NOTE — Progress Notes (Signed)
Left lower extremity venous duplex completed. Report called to Dr.Shaili Deveshwar. Preliminary results in Chart review CV Proc. [facility], [place] 03/16/2018, 5:55 PM

## 2018-03-16 NOTE — Patient Instructions (Signed)
Standing Labs We placed an order today for your standing lab work.    Please come back and get your standing labs in May and every 3 months  We have open lab Monday through Friday from 8:30-11:30 AM and 1:30-4:00 PM  at the office of Dr. Shanira Tine.   You may experience shorter wait times on Monday and Friday afternoons. The office is located at [address], [address], [place], [place] [address] No appointment is necessary.   Labs are drawn by [facility].  You may receive a bill from [facility] for your lab work.  If you wish to have your labs drawn at another location, please call the office 24 hours in advance to send orders.  If you have any questions regarding directions or hours of operation,  please call [phone].   Just as a reminder please drink plenty of water prior to coming for your lab work. Thanks!  

## 2018-03-17 DIAGNOSIS — H35373 Puckering of macula, bilateral: Secondary | ICD-10-CM | POA: Diagnosis not present

## 2018-03-17 DIAGNOSIS — H3581 Retinal edema: Secondary | ICD-10-CM | POA: Diagnosis not present

## 2018-03-17 DIAGNOSIS — H35352 Cystoid macular degeneration, left eye: Secondary | ICD-10-CM | POA: Diagnosis not present

## 2018-03-17 LAB — COMPLETE METABOLIC PANEL WITH GFR
AG Ratio: 2 (calc) (ref 1.0–2.5)
ALT: 17 U/L (ref 6–29)
AST: 17 U/L (ref 10–35)
Albumin: 4.3 g/dL (ref 3.6–5.1)
Alkaline phosphatase (APISO): 100 U/L (ref 37–153)
BUN: 15 mg/dL (ref 7–25)
CO2: 27 mmol/L (ref 20–32)
Calcium: 9.2 mg/dL (ref 8.6–10.4)
Chloride: 103 mmol/L (ref 98–110)
Creat: 0.73 mg/dL (ref 0.60–0.93)
GFR, EST AFRICAN AMERICAN: 97 mL/min/{1.73_m2} (ref >=60)
GFR, Est Non African American: 83 mL/min/{1.73_m2} (ref >=60)
GLOBULIN: 2.2 g/dL (ref 1.9–3.7)
Glucose, Bld: 84 mg/dL (ref 65–99)
Potassium: 4.1 mmol/L (ref 3.5–5.3)
Sodium: 139 mmol/L (ref 135–146)
Total Bilirubin: 0.3 mg/dL (ref 0.2–1.2)
Total Protein: 6.5 g/dL (ref 6.1–8.1)

## 2018-03-17 LAB — CBC WITH DIFFERENTIAL/PLATELET
Absolute Monocytes: 698 cells/uL (ref 200–950)
Basophils Absolute: 29 cells/uL (ref 0–200)
Basophils Relative: 0.4 %
Eosinophils Absolute: 130 cells/uL (ref 15–500)
Eosinophils Relative: 1.8 %
HCT: 34.8 % — ABNORMAL LOW (ref 35.0–45.0)
Hemoglobin: 11.5 g/dL — ABNORMAL LOW (ref 11.7–15.5)
Lymphs Abs: 1498 cells/uL (ref 850–3900)
MCH: 27.9 pg (ref 27.0–33.0)
MCHC: 33 g/dL (ref 32.0–36.0)
MCV: 84.5 fL (ref 80.0–100.0)
MONOS PCT: 9.7 %
MPV: 10.7 fL (ref 7.5–12.5)
Neutro Abs: 4846 cells/uL (ref 1500–7800)
Neutrophils Relative %: 67.3 %
Platelets: 214 10*3/uL (ref 140–400)
RBC: 4.12 10*6/uL (ref 3.80–5.10)
RDW: 15.5 % — ABNORMAL HIGH (ref 11.0–15.0)
Total Lymphocyte: 20.8 %
WBC: 7.2 10*3/uL (ref 3.8–10.8)

## 2018-03-17 LAB — HM DIABETES EYE EXAM

## 2018-03-17 NOTE — Progress Notes (Signed)
I received a phone call from vascular lab yesterday evening.  Her study was negative for DVT.  Patient had a lot of pedal edema.  Please advise patient to schedule appointment with her PCP.

## 2018-03-18 ENCOUNTER — Ambulatory Visit (INDEPENDENT_AMBULATORY_CARE_PROVIDER_SITE_OTHER): Payer: Medicare Other | Admitting: Internal Medicine

## 2018-03-18 ENCOUNTER — Encounter: Payer: Self-pay | Admitting: Internal Medicine

## 2018-03-18 ENCOUNTER — Ambulatory Visit (INDEPENDENT_AMBULATORY_CARE_PROVIDER_SITE_OTHER): Payer: Medicare Other

## 2018-03-18 VITALS — BP 144/86 | HR 73 | Temp 98.1°F | Ht 62.0 in | Wt 175.6 lb

## 2018-03-18 VITALS — BP 114/86 | HR 62 | Temp 98.1°F | Ht 62.0 in | Wt 175.0 lb

## 2018-03-18 DIAGNOSIS — E1169 Type 2 diabetes mellitus with other specified complication: Secondary | ICD-10-CM | POA: Diagnosis not present

## 2018-03-18 DIAGNOSIS — I25119 Atherosclerotic heart disease of native coronary artery with unspecified angina pectoris: Secondary | ICD-10-CM | POA: Diagnosis not present

## 2018-03-18 DIAGNOSIS — E1159 Type 2 diabetes mellitus with other circulatory complications: Secondary | ICD-10-CM | POA: Diagnosis not present

## 2018-03-18 DIAGNOSIS — E7800 Pure hypercholesterolemia, unspecified: Secondary | ICD-10-CM | POA: Diagnosis not present

## 2018-03-18 DIAGNOSIS — Z0000 Encounter for general adult medical examination without abnormal findings: Secondary | ICD-10-CM

## 2018-03-18 DIAGNOSIS — Z6832 Body mass index (BMI) 32.0-32.9, adult: Secondary | ICD-10-CM | POA: Diagnosis not present

## 2018-03-18 DIAGNOSIS — E669 Obesity, unspecified: Secondary | ICD-10-CM | POA: Diagnosis not present

## 2018-03-18 DIAGNOSIS — I10 Essential (primary) hypertension: Secondary | ICD-10-CM

## 2018-03-18 DIAGNOSIS — I152 Hypertension secondary to endocrine disorders: Secondary | ICD-10-CM

## 2018-03-18 LAB — POCT URINALYSIS DIPSTICK
Bilirubin, UA: NEGATIVE
Glucose, UA: NEGATIVE
Ketones, UA: NEGATIVE
Leukocytes, UA: NEGATIVE
Nitrite, UA: NEGATIVE
Protein, UA: NEGATIVE
RBC UA: NEGATIVE
Spec Grav, UA: 1.02 (ref 1.010–1.025)
Urobilinogen, UA: 0.2 E.U./dL
pH, UA: 5.5 (ref 5.0–8.0)

## 2018-03-18 LAB — POCT UA - MICROALBUMIN
Albumin/Creatinine Ratio, Urine, POC: 30
Creatinine, POC: 300 mg/dL
Microalbumin Ur, POC: 30 mg/L

## 2018-03-18 MED ORDER — TRAMADOL HCL 50 MG PO TABS: 50.0000 mg | ORAL_TABLET | Freq: Four times a day (QID) | ORAL | 0 refills | Status: DC | PRN

## 2018-03-18 NOTE — Patient Instructions (Signed)
Sheila Reynolds , Thank you for taking time to come for your Medicare Wellness Visit. I appreciate your ongoing commitment to your health goals. Please review the following plan we discussed and let me know if I can assist you in the future.   Screening recommendations/referrals: Colonoscopy: 08/2014 Mammogram: 04/2017 Bone Density: 12/2013 Recommended yearly ophthalmology/optometry visit for glaucoma screening and checkup Recommended yearly dental visit for hygiene and checkup  Vaccinations: Influenza vaccine: 12/2017 Pneumococcal vaccine: 12/2017 Tdap vaccine: 10/2012 Shingles vaccine: 12/2017 ist dose    Advanced directives: Advance directive discussed with you today. I have provided a copy for you to complete at home and have notarized. Once this is complete please bring a copy in to our office so we can scan it into your chart.   Conditions/risks identified: Obese, leg edema  Next appointment: 07/20/2018 at 11:15a   Preventive Care 65 Years and Older, Female Preventive care refers to lifestyle choices and visits with your health care provider that can promote health and wellness. What does preventive care include?  A yearly physical exam. This is also called an annual well check.  Dental exams once or twice a year.  Routine eye exams. Ask your health care provider how often you should have your eyes checked.  Personal lifestyle choices, including:  Daily care of your teeth and gums.  Regular physical activity.  Eating a healthy diet.  Avoiding tobacco and drug use.  Limiting alcohol use.  Practicing safe sex.  Taking low-dose aspirin every day.  Taking vitamin and mineral supplements as recommended by your health care provider. What happens during an annual well check? The services and screenings done by your health care provider during your annual well check will depend on your age, overall health, lifestyle risk factors, and family history of disease. Counseling    Your health care provider may ask you questions about your:  Alcohol use.  Tobacco use.  Drug use.  Emotional well-being.  Home and relationship well-being.  Sexual activity.  Eating habits.  History of falls.  Memory and ability to understand (cognition).  Work and work Statistician.  Reproductive health. Screening  You may have the following tests or measurements:  Height, weight, and BMI.  Blood pressure.  Lipid and cholesterol levels. These may be checked every 5 years, or more frequently if you are over 95 years old.  Skin check.  Lung cancer screening. You may have this screening every year starting at age 90 if you have a 30-pack-year history of smoking and currently smoke or have quit within the past 15 years.  Fecal occult blood test (FOBT) of the stool. You may have this test every year starting at age 36.  Flexible sigmoidoscopy or colonoscopy. You may have a sigmoidoscopy every 5 years or a colonoscopy every 10 years starting at age 21.  Hepatitis C blood test.  Hepatitis B blood test.  Sexually transmitted disease (STD) testing.  Diabetes screening. This is done by checking your blood sugar (glucose) after you have not eaten for a while (fasting). You may have this done every 1-3 years.  Bone density scan. This is done to screen for osteoporosis. You may have this done starting at age 25.  Mammogram. This may be done every 1-2 years. Talk to your health care provider about how often you should have regular mammograms. Talk with your health care provider about your test results, treatment options, and if necessary, the need for more tests. Vaccines  Your health care provider may recommend  certain vaccines, such as:  Influenza vaccine. This is recommended every year.  Tetanus, diphtheria, and acellular pertussis (Tdap, Td) vaccine. You may need a Td booster every 10 years.  Zoster vaccine. You may need this after age 9.  Pneumococcal 13-valent  conjugate (PCV13) vaccine. One dose is recommended after age 81.  Pneumococcal polysaccharide (PPSV23) vaccine. One dose is recommended after age 45. Talk to your health care provider about which screenings and vaccines you need and how often you need them. This information is not intended to replace advice given to you by your health care provider. Make sure you discuss any questions you have with your health care provider. Document Released: 02/23/2015 Document Revised: 10/17/2015 Document Reviewed: 11/28/2014 Elsevier Interactive Patient Education  2017 [employer] Prevention in the Home Falls can cause injuries. They can happen to people of all ages. There are many things you can do to make your home safe and to help prevent falls. What can I do on the outside of my home?  Regularly fix the edges of walkways and driveways and fix any cracks.  Remove anything that might make you trip as you walk through a door, such as a raised step or threshold.  Trim any bushes or trees on the path to your home.  Use bright outdoor lighting.  Clear any walking paths of anything that might make someone trip, such as rocks or tools.  Regularly check to see if handrails are loose or broken. Make sure that both sides of any steps have handrails.  Any raised decks and porches should have guardrails on the edges.  Have any leaves, snow, or ice cleared regularly.  Use sand or salt on walking paths during winter.  Clean up any spills in your garage right away. This includes oil or grease spills. What can I do in the bathroom?  Use night lights.  Install grab bars by the toilet and in the tub and shower. Do not use towel bars as grab bars.  Use non-skid mats or decals in the tub or shower.  If you need to sit down in the shower, use a plastic, non-slip stool.  Keep the floor dry. Clean up any water that spills on the floor as soon as it happens.  Remove soap buildup in the tub or  shower regularly.  Attach bath mats securely with double-sided non-slip rug tape.  Do not have throw rugs and other things on the floor that can make you trip. What can I do in the bedroom?  Use night lights.  Make sure that you have a light by your bed that is easy to reach.  Do not use any sheets or blankets that are too big for your bed. They should not hang down onto the floor.  Have a firm chair that has side arms. You can use this for support while you get dressed.  Do not have throw rugs and other things on the floor that can make you trip. What can I do in the kitchen?  Clean up any spills right away.  Avoid walking on wet floors.  Keep items that you use a lot in easy-to-reach places.  If you need to reach something above you, use a strong step stool that has a grab bar.  Keep electrical cords out of the way.  Do not use floor polish or wax that makes floors slippery. If you must use wax, use non-skid floor wax.  Do not have throw rugs and other  things on the floor that can make you trip. What can I do with my stairs?  Do not leave any items on the stairs.  Make sure that there are handrails on both sides of the stairs and use them. Fix handrails that are broken or loose. Make sure that handrails are as long as the stairways.  Check any carpeting to make sure that it is firmly attached to the stairs. Fix any carpet that is loose or worn.  Avoid having throw rugs at the top or bottom of the stairs. If you do have throw rugs, attach them to the floor with carpet tape.  Make sure that you have a light switch at the top of the stairs and the bottom of the stairs. If you do not have them, ask someone to add them for you. What else can I do to help prevent falls?  Wear shoes that:  Do not have high heels.  Have rubber bottoms.  Are comfortable and fit you well.  Are closed at the toe. Do not wear sandals.  If you use a stepladder:  Make sure that it is fully  opened. Do not climb a closed stepladder.  Make sure that both sides of the stepladder are locked into place.  Ask someone to hold it for you, if possible.  Clearly mark and make sure that you can see:  Any grab bars or handrails.  First and last steps.  Where the edge of each step is.  Use tools that help you move around (mobility aids) if they are needed. These include:  Canes.  Walkers.  Scooters.  Crutches.  Turn on the lights when you go into a dark area. Replace any light bulbs as soon as they burn out.  Set up your furniture so you have a clear path. Avoid moving your furniture around.  If any of your floors are uneven, fix them.  If there are any pets around you, be aware of where they are.  Review your medicines with your doctor. Some medicines can make you feel dizzy. This can increase your chance of falling. Ask your doctor what other things that you can do to help prevent falls. This information is not intended to replace advice given to you by your health care provider. Make sure you discuss any questions you have with your health care provider. Document Released: 11/23/2008 Document Revised: 07/05/2015 Document Reviewed: 03/03/2014 Elsevier Interactive Patient Education  2017 [employer].

## 2018-03-18 NOTE — Patient Instructions (Addendum)
Diabetes Mellitus and Foot Care Foot care is an important part of your health, especially when you have diabetes. Diabetes may cause you to have problems because of poor blood flow (circulation) to your feet and legs, which can cause your skin to:  Become thinner and drier.  Break more easily.  Heal more slowly.  Peel and crack. You may also have nerve damage (neuropathy) in your legs and feet, causing decreased feeling in them. This means that you may not notice minor injuries to your feet that could lead to more serious problems. Noticing and addressing any potential problems early is the best way to prevent future foot problems. How to care for your feet Foot hygiene  Wash your feet daily with warm water and mild soap. Do not use hot water. Then, pat your feet and the areas between your toes until they are completely dry. Do not soak your feet as this can dry your skin.  Trim your toenails straight across. Do not dig under them or around the cuticle. File the edges of your nails with an emery board or nail file.  Apply a moisturizing lotion or petroleum jelly to the skin on your feet and to dry, brittle toenails. Use lotion that does not contain alcohol and is unscented. Do not apply lotion between your toes. Shoes and socks  Wear clean socks or stockings every day. Make sure they are not too tight. Do not wear knee-high stockings since they may decrease blood flow to your legs.  Wear shoes that fit properly and have enough cushioning. Always look in your shoes before you put them on to be sure there are no objects inside.  To break in new shoes, wear them for just a few hours a day. This prevents injuries on your feet. Wounds, scrapes, corns, and calluses  Check your feet daily for blisters, cuts, bruises, sores, and redness. If you cannot see the bottom of your feet, use a mirror or ask someone for help.  Do not cut corns or calluses or try to remove them with medicine.  If you  find a minor scrape, cut, or break in the skin on your feet, keep it and the skin around it clean and dry. You may clean these areas with mild soap and water. Do not clean the area with peroxide, alcohol, or iodine.  If you have a wound, scrape, corn, or callus on your foot, look at it several times a day to make sure it is healing and not infected. Check for: ? Redness, swelling, or pain. ? Fluid or blood. ? Warmth. ? Pus or a bad smell. General instructions  Do not cross your legs. This may decrease blood flow to your feet.  Do not use heating pads or hot water bottles on your feet. They may burn your skin. If you have lost feeling in your feet or legs, you may not know this is happening until it is too late.  Protect your feet from hot and cold by wearing shoes, such as at the beach or on hot pavement.  Schedule a complete foot exam at least once a year (annually) or more often if you have foot problems. If you have foot problems, report any cuts, sores, or bruises to your health care provider immediately. Contact a health care provider if:  You have a medical condition that increases your risk of infection and you have any cuts, sores, or bruises on your feet.  You have an injury that is not   healing.  You have redness on your legs or feet.  You feel burning or tingling in your legs or feet.  You have pain or cramps in your legs and feet.  Your legs or feet are numb.  Your feet always feel cold.  You have pain around a toenail. Get help right away if:  You have a wound, scrape, corn, or callus on your foot and: ? You have pain, swelling, or redness that gets worse. ? You have fluid or blood coming from the wound, scrape, corn, or callus. ? Your wound, scrape, corn, or callus feels warm to the touch. ? You have pus or a bad smell coming from the wound, scrape, corn, or callus. ? You have a fever. ? You have a red line going up your leg. Summary  Check your feet every day  for cuts, sores, red spots, swelling, and blisters.  Moisturize feet and legs daily.  Wear shoes that fit properly and have enough cushioning.  If you have foot problems, report any cuts, sores, or bruises to your health care provider immediately.  Schedule a complete foot exam at least once a year (annually) or more often if you have foot problems. This information is not intended to replace advice given to you by your health care provider. Make sure you discuss any questions you have with your health care provider. Document Released: 01/25/2000 Document Revised: 03/11/2017 Document Reviewed: 02/29/2016 Elsevier Interactive Patient Education  2019 [employer]   Duloxetine delayed-release capsules What is this medicine? DULOXETINE (doo LOX e teen) is used to treat depression, anxiety, and different types of chronic pain. This medicine may be used for other purposes; ask your health care provider or pharmacist if you have questions. COMMON BRAND NAME(S): Cymbalta, [place] What should I tell my health care provider before I take this medicine? They need to know if you have any of these conditions: -bipolar disorder or a family history of bipolar disorder -glaucoma -kidney disease -liver disease -suicidal thoughts or a previous suicide attempt -taken medicines called MAOIs like Carbex, Eldepryl, Marplan, Nardil, and Parnate within 14 days -an unusual reaction to duloxetine, other medicines, foods, dyes, or preservatives -pregnant or trying to get pregnant -breast-feeding How should I use this medicine? Take this medicine by mouth with a glass of water. Follow the directions on the prescription label. Do not cut, crush or chew this medicine. You can take this medicine with or without food. Take your medicine at regular intervals. Do not take your medicine more often than directed. Do not stop taking this medicine suddenly except upon the advice of your doctor. Stopping this medicine too  quickly may cause serious side effects or your condition may worsen. A special MedGuide will be given to you by the pharmacist with each prescription and refill. Be sure to read this information carefully each time. Talk to your pediatrician regarding the use of this medicine in children. While this drug may be prescribed for children as young as 57 years of age for selected conditions, precautions do apply. Overdosage: If you think you have taken too much of this medicine contact a poison control center or emergency room at once. NOTE: This medicine is only for you. Do not share this medicine with others. What if I miss a dose? If you miss a dose, take it as soon as you can. If it is almost time for your next dose, take only that dose. Do not take double or extra doses. What may interact  with this medicine? Do not take this medicine with any of the following medications: -desvenlafaxine -levomilnacipran -linezolid -MAOIs like Carbex, Eldepryl, Marplan, Nardil, and Parnate -methylene blue (injected into a vein) -milnacipran -thioridazine -venlafaxine This medicine may also interact with the following medications: -alcohol -amphetamines -aspirin and aspirin-like medicines -certain antibiotics like ciprofloxacin and enoxacin -certain medicines for blood pressure, heart disease, irregular heart beat -certain medicines for depression, anxiety, or psychotic disturbances -certain medicines for migraine headache like almotriptan, eletriptan, frovatriptan, naratriptan, rizatriptan, sumatriptan, zolmitriptan -certain medicines that treat or prevent blood clots like warfarin, enoxaparin, and dalteparin -cimetidine -fentanyl -lithium -NSAIDS, medicines for pain and inflammation, like ibuprofen or naproxen -phentermine -procarbazine -rasagiline -sibutramine -St. John's wort -theophylline -tramadol -tryptophan This list may not describe all possible interactions. Give your health care  provider a list of all the medicines, herbs, non-prescription drugs, or dietary supplements you use. Also tell them if you smoke, drink alcohol, or use illegal drugs. Some items may interact with your medicine. What should I watch for while using this medicine? Tell your doctor if your symptoms do not get better or if they get worse. Visit your doctor or health care professional for regular checks on your progress. Because it may take several weeks to see the full effects of this medicine, it is important to continue your treatment as prescribed by your doctor. Patients and their families should watch out for new or worsening thoughts of suicide or depression. Also watch out for sudden changes in feelings such as feeling anxious, agitated, panicky, irritable, hostile, aggressive, impulsive, severely restless, overly excited and hyperactive, or not being able to sleep. If this happens, especially at the beginning of treatment or after a change in dose, call your health care professional. Dennis Bast may get drowsy or dizzy. Do not drive, use machinery, or do anything that needs mental alertness until you know how this medicine affects you. Do not stand or sit up quickly, especially if you are an older patient. This reduces the risk of dizzy or fainting spells. Alcohol may interfere with the effect of this medicine. Avoid alcoholic drinks. This medicine can cause an increase in blood pressure. This medicine can also cause a sudden drop in your blood pressure, which may make you feel faint and increase the chance of a fall. These effects are most common when you first start the medicine or when the dose is increased, or during use of other medicines that can cause a sudden drop in blood pressure. Check with your doctor for instructions on monitoring your blood pressure while taking this medicine. Your mouth may get dry. Chewing sugarless gum or sucking hard candy, and drinking plenty of water may help. Contact your  doctor if the problem does not go away or is severe. What side effects may I notice from receiving this medicine? Side effects that you should report to your doctor or health care professional as soon as possible: -allergic reactions like skin rash, itching or hives, swelling of the face, lips, or tongue -anxious -breathing problems -confusion -changes in vision -chest pain -confusion -elevated mood, decreased need for sleep, racing thoughts, impulsive behavior -eye pain -fast, irregular heartbeat -feeling faint or lightheaded, falls -feeling agitated, angry, or irritable -hallucination, loss of contact with reality -high blood pressure -loss of balance or coordination -palpitations -redness, blistering, peeling or loosening of the skin, including inside the mouth -restlessness, pacing, inability to keep still -seizures -stiff muscles -suicidal thoughts or other mood changes -trouble passing urine or change in the amount  of urine -trouble sleeping -unusual bleeding or bruising -unusually weak or tired -vomiting -yellowing of the eyes or skin Side effects that usually do not require medical attention (report to your doctor or health care professional if they continue or are bothersome): -change in sex drive or performance -change in appetite or weight -constipation -dizziness -dry mouth -headache -increased sweating -nausea -tired This list may not describe all possible side effects. Call your doctor for medical advice about side effects. You may report side effects to FDA at 1-800-FDA-1088. Where should I keep my medicine? Keep out of the reach of children. Store at room temperature between 20 and 25 degrees C (68 to 77 degrees F). Throw away any unused medicine after the expiration date. NOTE: This sheet is a summary. It may not cover all possible information. If you have questions about this medicine, talk to your doctor, pharmacist, or health care provider.  2019  Elsevier/[employer] (2015-06-28 18:16:03)

## 2018-03-18 NOTE — Progress Notes (Signed)
Subjective:   RHEMA BOYETT is a 73 y.o. female who presents for Medicare Annual (Subsequent) preventive examination.  Review of Systems:  n/a Cardiac Risk Factors include: advanced age (>1men, >44 women);obesity (BMI >30kg/m2);sedentary lifestyle     Objective:     Vitals: BP 114/86 (BP Location: Left Arm, Patient Position: Sitting)   Pulse 62   Temp 98.1 F (36.7 C) (Oral)   Ht 5\' 2"  (1.575 m)   Wt 175 lb (79.4 kg)   BMI 32.01 kg/m   Body mass index is 32.01 kg/m.  Advanced Directives 03/18/2018 08/26/2017 03/23/2017 01/15/2017 12/12/2016 04/30/2016 03/19/2016  Does Patient Have a Medical Advance Directive? No No No No No No No  Would patient like information on creating a medical advance directive? Yes (MAU/Ambulatory/Procedural Areas - Information given) No - Patient declined - No - Patient declined - - -  Pre-existing out of facility DNR order (yellow form or pink MOST form) - - - - - - -    Tobacco Social History   Tobacco Use  Smoking Status Former Smoker  . Packs/day: 1.00  . Years: 4.00  . Pack years: 4.00  . Types: Cigarettes  . Last attempt to quit: 02/11/1980  . Years since quitting: 38.1  Smokeless Tobacco Never Used     Counseling given: Not Answered   Clinical Intake:  Pre-visit preparation completed: Yes  Pain : 0-10 Pain Score: 7  Pain Type: Chronic pain Pain Location: Leg(left swollen) Pain Orientation: Left Pain Radiating Towards: hip Pain Descriptors / Indicators: Aching Pain Onset: More than a month ago Pain Frequency: Constant Pain Relieving Factors: elevating at night helps some Effect of Pain on Daily Activities: slows down and has stopped exercise  Pain Relieving Factors: elevating at night helps some  Nutritional Status: BMI > 30  Obese Nutritional Risks: None Diabetes: No  How often do you need to have someone help you when you read instructions, pamphlets, or other written materials from your doctor or pharmacy?: 1 -  Never What is the last grade level you completed in school?: 17 years of school  Interpreter Needed?: No  Information entered by :: NAllen LPN  Past Medical History:  Diagnosis Date  . Anemia    3 months ago anemic  . Anxiety    on meds  . Arthritis    "all over" (01/15/2017)  . Asthma   . Bronchitis with emphysema   . Chest pain   . Chronic bronchitis ([place])   . Coronary artery disease    a. 01/2017 she underwent orbital atherectomy/DES to the proxmal LAD and PTCA to ostial D2. 2D Echo 01/15/17 showed mild LVH, EF 60-65%, grade 1 DD.  . Family history of anesthesia complication    daughter N/V  . Fibromyalgia   . GERD (gastroesophageal reflux disease)    on meds  . Heart murmur   . History of hiatal hernia   . Hx of echocardiogram    Echo (03/2013):  Tech limited; Mild focal basal septal hypertrophy, EF 60-65%, normal RVF  . Hyperlipidemia   . Hypertension   . Nonsustained ventricular tachycardia ([place])   . OSA on CPAP   . Premature atrial contractions   . PVC (premature ventricular contraction)    a. Holter 12/16: NSR, occ PAC,PVCs  . Sarcoidosis   . Type II diabetes mellitus ([place])    Past Surgical History:  Procedure Laterality Date  . BREAST SURGERY    . CARDIAC CATHETERIZATION  05/04/2007   reveals overall  normal left ventricular systolic function. Ejection fraction 65-70%  . CATARACT EXTRACTION W/PHACO Right 07/20/2013   Procedure: CATARACT EXTRACTION PHACO AND INTRAOCULAR LENS PLACEMENT (IOC);  Surgeon: Marylynn Pearson, MD;  Location: [facility];  Service: Ophthalmology;  Laterality: Right;  . COLONOSCOPY W/ BIOPSIES AND POLYPECTOMY    . COLONOSCOPY WITH PROPOFOL N/A 09/07/2014   Procedure: COLONOSCOPY WITH PROPOFOL;  Surgeon: Juanita Craver, MD;  Location: [facility];  Service: Endoscopy;  Laterality: N/A;  . CORONARY ANGIOPLASTY WITH STENT PLACEMENT  01/15/2017  . CORONARY ATHERECTOMY N/A 01/15/2017   Procedure: CORONARY ATHERECTOMY;  Surgeon: Nelva Bush, MD;   Location: [facility];  Service: Cardiovascular;  Laterality: N/A;  . CORONARY BALLOON ANGIOPLASTY N/A 01/15/2017   Procedure: CORONARY BALLOON ANGIOPLASTY;  Surgeon: Nelva Bush, MD;  Location: [facility];  Service: Cardiovascular;  Laterality: N/A;  . CORONARY STENT INTERVENTION N/A 01/15/2017   Procedure: CORONARY STENT INTERVENTION;  Surgeon: Nelva Bush, MD;  Location: [facility];  Service: Cardiovascular;  Laterality: N/A;  . ESOPHAGOGASTRODUODENOSCOPY (EGD) WITH PROPOFOL N/A 09/07/2014   Procedure: ESOPHAGOGASTRODUODENOSCOPY (EGD) WITH PROPOFOL;  Surgeon: Juanita Craver, MD;  [facility];  Service: Endoscopy;  Laterality: N/A;  . EXTERNAL EAR SURGERY Bilateral 1970s   tumors removed  . EYE SURGERY Left 2019   cataract extraction   . EYE SURGERY Right 02/09/2018   eyelid surgery   . INTRAVASCULAR PRESSURE WIRE/FFR STUDY N/A 12/12/2016   Procedure: INTRAVASCULAR PRESSURE WIRE/FFR STUDY;  Surgeon: Nelva Bush, MD;  Location: [facility];  Service: Cardiovascular;  Laterality: N/A;  . MINI SHUNT INSERTION Right 07/20/2013   Procedure: INSERTION OF GLAUCOMA FILTRATION DEVICE RIGHT EYE;  Surgeon: Marylynn Pearson, MD;  Location: [facility];  Service: Ophthalmology;  Laterality: Right;  . MITOMYCIN C APPLICATION Right 8/78/6767   Procedure: MITOMYCIN C APPLICATION;  Surgeon: Marylynn Pearson, MD;  Location: [facility];  Service: Ophthalmology;  Laterality: Right;  . MITOMYCIN C APPLICATION Right 03/22/4707   Procedure: MITOMYCIN C APPLICATION RIGHT EYE;  Surgeon: Marylynn Pearson, MD;  Location: [facility];  Service: Ophthalmology;  Laterality: Right;  . PLACEMENT OF BREAST IMPLANTS Bilateral 1992   "took all my breast tissue out; put implants in;fibrocystic breast disease "  . RIGHT/LEFT HEART CATH AND CORONARY ANGIOGRAPHY N/A 12/12/2016   Procedure: RIGHT/LEFT HEART CATH AND CORONARY ANGIOGRAPHY;  Surgeon: Nelva Bush, MD;  Location: [place] [facility];  Service:  Cardiovascular;  Laterality: N/A;  . TONSILLECTOMY    . TOTAL ABDOMINAL HYSTERECTOMY  1982  . TRABECULECTOMY Right 02/21/2015   Procedure: TRABECULECTOMY WITH [facility] ON THE RIGHT EYE;  Surgeon: Marylynn Pearson, MD;  Location: [facility];  Service: Ophthalmology;  Laterality: Right;   Family History  Problem Relation Age of Onset  . Heart disease Father   . Diabetes Father   . Glaucoma Father   . Cancer Mother        unknown type, ?lung  . Heart disease Paternal Grandmother   . Cancer Paternal Grandmother        unknown type  . Diabetes Brother   . Glaucoma Brother    Social History   Socioeconomic History  . Marital status: Married    Spouse name: Not on file  . Number of children: 2  . Years of education: Not on file  . Highest education level: Not on file  Occupational History  . Occupation: unemployed    Fish farm manager: Building surveyor  Social Needs  . Financial resource strain: Not hard  at all  . Food insecurity:    Worry: Never true    Inability: Never true  . Transportation needs:    Medical: No    Non-medical: No  Tobacco Use  . Smoking status: Former Smoker    Packs/day: 1.00    Years: 4.00    Pack years: 4.00    Types: Cigarettes    Last attempt to quit: 02/11/1980    Years since quitting: 38.1  . Smokeless tobacco: Never Used  Substance and Sexual Activity  . Alcohol use: No  . Drug use: No  . Sexual activity: Yes  Lifestyle  . Physical activity:    Days per week: 0 days    Minutes per session: 0 min  . Stress: Not at all  Relationships  . Social connections:    Talks on phone: Not on file    Gets together: Not on file    Attends religious service: Not on file    Active member of club or organization: Not on file    Attends meetings of clubs or organizations: Not on file    Relationship status: Not on file  Other Topics Concern  . Not on file  Social History Narrative  . Not on file    Outpatient Encounter Medications as of 03/18/2018  Medication  Sig  . acetaminophen (TYLENOL) 500 MG tablet Take 1,000 mg by mouth 2 (two) times daily as needed for moderate pain or headache.  . albuterol (PROVENTIL HFA;VENTOLIN HFA) 108 (90 Base) MCG/ACT inhaler Inhale 2 puffs into the lungs every 6 (six) hours as needed for wheezing or shortness of breath.  [name] albuterol (PROVENTIL) (2.5 MG/3ML) 0.083% nebulizer solution Take 2.5 mg by nebulization daily as needed for wheezing or shortness of breath.   [name] aspirin EC 81 MG tablet Take 81 mg by mouth at bedtime.   [name] atorvastatin (LIPITOR) 40 MG tablet TAKE 1 TABLET(40 MG) BY MOUTH DAILY  . brimonidine (ALPHAGAN P) 0.1 % SOLN Place 1 drop into the left eye 3 (three) times daily.   . brinzolamide (AZOPT) 1 % ophthalmic suspension Place 1 drop into the left eye 3 (three) times daily.   . carvedilol (COREG) 12.5 MG tablet Take 1 tablet (12.5 mg total) by mouth 2 (two) times daily.  . clopidogrel (PLAVIX) 75 MG tablet Take 1 tablet (75 mg total) by mouth daily.  . folic acid (FOLVITE) 1 MG tablet Take 2 tablets (2 mg total) by mouth daily.  . furosemide (LASIX) 20 MG tablet TAKE 1 TABLET BY MOUTH EVERY 3 DAYS  . guaiFENesin (ROBITUSSIN) 100 MG/5ML SOLN Take 5 mLs by mouth every 6 (six) hours as needed for cough or to loosen phlegm.   . isosorbide mononitrate (IMDUR) 60 MG 24 hr tablet Take 1 tablet (60 mg total) by mouth daily.  [name] loteprednol (LOTEMAX) 0.5 % ophthalmic suspension See admin instructions. Instill 8 DROPS IN LEFT EYE 3 IN RIGHT EYE DAILY  . methotrexate (RHEUMATREX) 2.5 MG tablet Take 8 tablets (20 mg total) by mouth once a week. Caution:Chemotherapy. Protect from light.  . montelukast (SINGULAIR) 10 MG tablet Take 10 mg by mouth at bedtime.  . nitroGLYCERIN (NITROSTAT) 0.4 MG SL tablet Place 1 tablet (0.4 mg total) under the tongue every 5 (five) minutes as needed for chest pain.  [name] olmesartan (BENICAR) 20 MG tablet Take 20 mg by mouth daily.  [name] OVER THE COUNTER MEDICATION   . pantoprazole (PROTONIX)  40 MG tablet TAKE 1 TABLET(40 MG) BY MOUTH  DAILY  . Polyvinyl Alcohol-Povidone (REFRESH OP) Apply to eye daily.  . Pregabalin ER (LYRICA CR) 165 MG TB24 Take 1 tablet by mouth 3 (three) times daily.   . sitaGLIPtin-metformin (JANUMET) 50-500 MG tablet Take 1 tablet by mouth 2 (two) times daily with a meal.  . traMADol (ULTRAM) 50 MG tablet Take 1 tablet (50 mg total) by mouth every 6 (six) hours as needed.  . travoprost, benzalkonium, (TRAVATAN) 0.004 % ophthalmic solution Place 1 drop into the right eye at bedtime.    No facility-administered encounter medications on file as of 03/18/2018.     Activities of Daily Living In your present state of health, do you have any difficulty performing the following activities: 03/18/2018  Hearing? Y  Comment decreased bilateral hearing, wears hearing aides  Vision? Y  Comment infected retina, decreased eye sight  Difficulty concentrating or making decisions? Y  Comment having issues with memory of "everything"  Walking or climbing stairs? Y  Comment leg pains  Dressing or bathing? N  Doing errands, shopping? N  Preparing Food and eating ? N  Using the Toilet? N  In the past six months, have you accidently leaked urine? Y  Comment wears a pad daily  Do you have problems with loss of bowel control? N  Comment trouble holding gas  Managing your Medications? N  Managing your Finances? N  Housekeeping or managing your Housekeeping? N  Some recent data might be hidden    Patient Care Team: Glendale Chard, MD as PCP - General (Internal Medicine) [name], Wonda Cheng, MD as PCP - Cardiology (Cardiology) [name], Wonda Cheng, MD as Consulting Physician (Cardiology)    Assessment:   This is a routine wellness examination for Shakaya.  Exercise Activities and Dietary recommendations Current Exercise Habits: The patient does not participate in regular exercise at present, Exercise limited by: None identified  Goals    . Patient Stated     No goals at  this time       Fall Risk Fall Risk  03/18/2018 03/18/2018 01/13/2018 02/24/2017 01/27/2017  Falls in the past year? 1 0 1 Yes No  Number falls in past yr: 0 - 0 2 or more -  Comment tripped ans fell - - - -  Injury with Fall? 0 - 0 No -  Risk for fall due to : History of fall(s) - - - -  Follow up Education provided;Falls prevention discussed - - - -   Is the patient's home free of loose throw rugs in walkways, pet beds, electrical cords, etc?   yes      Grab bars in the bathroom? no      Handrails on the stairs?   yes      Adequate lighting?   yes  Timed Get Up and Go performed: n/a  Depression Screen PHQ 2/9 Scores 03/18/2018 03/18/2018 03/18/2018 01/13/2018  PHQ - 2 Score 1 3 4  0  PHQ- 9 Score - 9 15 -     Cognitive Function     6CIT Screen 03/18/2018  What Year? 0 points  What month? 0 points  What time? 0 points  Count back from 20 0 points  Months in reverse 0 points  Repeat phrase 0 points  Total Score 0    Immunization History  Administered Date(s) Administered  . Influenza Split 10/31/2013  . Influenza Whole 12/23/2017  . Influenza, High Dose Seasonal PF 11/18/2016, 12/21/2017  . Influenza,inj,Quad PF,6+ Mos 02/25/2013, 03/19/2015  . Pneumococcal Polysaccharide-23 12/21/2017  .  Zoster Recombinat (Shingrix) 12/25/2017    Qualifies for Shingles Vaccine? yes  Screening Tests Health Maintenance  Topic Date Due  . FOOT EXAM  07/06/1955  . OPHTHALMOLOGY EXAM  07/06/1955  . HEMOGLOBIN A1C  07/15/2018  . PNA vac Low Risk Adult (2 of 2 - PCV13) 12/22/2018  . MAMMOGRAM  04/21/2019  . TETANUS/TDAP  11/10/2022  . COLONOSCOPY  09/06/2024  . INFLUENZA VACCINE  Completed  . DEXA SCAN  Completed  . Hepatitis C Screening  Completed    Cancer Screenings: Lung: Low Dose CT Chest recommended if Age 46-80 years, 30 pack-year currently smoking OR have quit w/in 15years. Patient does not qualify. Breast:  Up to date on Mammogram? Yes   Up to date of Bone Density/Dexa?  Yes Colorectal: up to date  Additional Screenings: : Hepatitis C Screening: completed     Plan:    States that she had eye exam in the past year.   I have personally reviewed and noted the following in the patient's chart:   . Medical and social history . Use of alcohol, tobacco or illicit drugs  . Current medications and supplements . Functional ability and status . Nutritional status . Physical activity . Advanced directives . List of other physicians . Hospitalizations, surgeries, and ER visits in previous 12 months . Vitals . Screenings to include cognitive, depression, and falls . Referrals and appointments  In addition, I have reviewed and discussed with patient certain preventive protocols, quality metrics, and best practice recommendations. A written personalized care plan for preventive services as well as general preventive health recommendations were provided to patient.     Kellie Simmering, LPN  04/14/7423

## 2018-03-19 LAB — LIPID PANEL
CHOL/HDL RATIO: 3.9 ratio (ref 0.0–4.4)
Cholesterol, Total: 166 mg/dL (ref 100–199)
HDL: 43 mg/dL (ref >39)
LDL Calculated: 100 mg/dL — ABNORMAL HIGH (ref 0–99)
Triglycerides: 114 mg/dL (ref 0–149)
VLDL Cholesterol Cal: 23 mg/dL (ref 5–40)

## 2018-03-19 LAB — HEMOGLOBIN A1C
Est. average glucose Bld gHb Est-mCnc: 131 mg/dL
Hgb A1c MFr Bld: 6.2 % — ABNORMAL HIGH (ref 4.8–5.6)

## 2018-03-20 NOTE — Progress Notes (Signed)
Subjective:     Patient ID: Sheila Reynolds , female    DOB: 1945-03-19 , 73 y.o.   MRN: [id number]   Chief Complaint  Patient presents with  . Diabetes  . Hypertension    HPI  Diabetes  She presents for her follow-up diabetic visit. She has type 2 diabetes mellitus. Her disease course has been stable. There are no hypoglycemic associated symptoms. Pertinent negatives for diabetes include no blurred vision and no chest pain. There are no hypoglycemic complications. Diabetic complications include heart disease. Her weight is stable. Her breakfast blood glucose is taken between 7-8 am. Her breakfast blood glucose range is generally 90-110 mg/dl. An ACE inhibitor/angiotensin II receptor blocker is being taken. Eye exam is current.  Hypertension  This is a chronic problem. The current episode started more than 1 year ago. The problem has been gradually improving since onset. The problem is controlled. Pertinent negatives include no blurred vision, chest pain or palpitations. Risk factors for coronary artery disease include diabetes mellitus, dyslipidemia, obesity, post-menopausal state and sedentary lifestyle.  She reports compliance with meds.   Past Medical History:  Diagnosis Date  . Anemia    3 months ago anemic  . Anxiety    on meds  . Arthritis    "all over" (01/15/2017)  . Asthma   . Bronchitis with emphysema   . Chest pain   . Chronic bronchitis ([place])   . Coronary artery disease    a. 01/2017 she underwent orbital atherectomy/DES to the proxmal LAD and PTCA to ostial D2. 2D Echo 01/15/17 showed mild LVH, EF 60-65%, grade 1 DD.  . Family history of anesthesia complication    daughter N/V  . Fibromyalgia   . GERD (gastroesophageal reflux disease)    on meds  . Heart murmur   . History of hiatal hernia   . Hx of echocardiogram    Echo (03/2013):  Tech limited; Mild focal basal septal hypertrophy, EF 60-65%, normal RVF  . Hyperlipidemia   . Hypertension   . Nonsustained  ventricular tachycardia ([place])   . OSA on CPAP   . Premature atrial contractions   . PVC (premature ventricular contraction)    a. Holter 12/16: NSR, occ PAC,PVCs  . Sarcoidosis   . Type II diabetes mellitus ([facility])      Family History  Problem Relation Age of Onset  . Heart disease Father   . Diabetes Father   . Glaucoma Father   . Cancer Mother        unknown type, ?lung  . Heart disease Paternal Grandmother   . Cancer Paternal Grandmother        unknown type  . Diabetes Brother   . Glaucoma Brother      Current Outpatient Medications:  .  acetaminophen (TYLENOL) 500 MG tablet, Take 1,000 mg by mouth 2 (two) times daily as needed for moderate pain or headache., Disp: , Rfl:  .  albuterol (PROVENTIL HFA;VENTOLIN HFA) 108 (90 Base) MCG/ACT inhaler, Inhale 2 puffs into the lungs every 6 (six) hours as needed for wheezing or shortness of breath., Disp: 1 Inhaler, Rfl: 12 .  albuterol (PROVENTIL) (2.5 MG/3ML) 0.083% nebulizer solution, Take 2.5 mg by nebulization daily as needed for wheezing or shortness of breath. , Disp: , Rfl:  .  aspirin EC 81 MG tablet, Take 81 mg by mouth at bedtime. , Disp: , Rfl:  .  atorvastatin (LIPITOR) 40 MG tablet, TAKE 1 TABLET(40 MG) BY MOUTH DAILY, Disp: 30 tablet,  Rfl: 8 .  brimonidine (ALPHAGAN P) 0.1 % SOLN, Place 1 drop into the left eye 3 (three) times daily. , Disp: , Rfl:  .  brinzolamide (AZOPT) 1 % ophthalmic suspension, Place 1 drop into the left eye 3 (three) times daily. , Disp: , Rfl:  .  carvedilol (COREG) 12.5 MG tablet, Take 1 tablet (12.5 mg total) by mouth 2 (two) times daily., Disp: 180 tablet, Rfl: 3 .  clopidogrel (PLAVIX) 75 MG tablet, Take 1 tablet (75 mg total) by mouth daily., Disp: 90 tablet, Rfl: 3 .  folic acid (FOLVITE) 1 MG tablet, Take 2 tablets (2 mg total) by mouth daily., Disp: 180 tablet, Rfl: 3 .  furosemide (LASIX) 20 MG tablet, TAKE 1 TABLET BY MOUTH EVERY 3 DAYS, Disp: 45 tablet, Rfl: 3 .  guaiFENesin (ROBITUSSIN)  100 MG/5ML SOLN, Take 5 mLs by mouth every 6 (six) hours as needed for cough or to loosen phlegm. , Disp: , Rfl:  .  isosorbide mononitrate (IMDUR) 60 MG 24 hr tablet, Take 1 tablet (60 mg total) by mouth daily., Disp: 90 tablet, Rfl: 3 .  loteprednol (LOTEMAX) 0.5 % ophthalmic suspension, See admin instructions. Instill 8 DROPS IN LEFT EYE 3 IN RIGHT EYE DAILY, Disp: , Rfl:  .  methotrexate (RHEUMATREX) 2.5 MG tablet, Take 8 tablets (20 mg total) by mouth once a week. Caution:Chemotherapy. Protect from light., Disp: 96 tablet, Rfl: 0 .  montelukast (SINGULAIR) 10 MG tablet, Take 10 mg by mouth at bedtime., Disp: , Rfl:  .  nitroGLYCERIN (NITROSTAT) 0.4 MG SL tablet, Place 1 tablet (0.4 mg total) under the tongue every 5 (five) minutes as needed for chest pain., Disp: 25 tablet, Rfl: prn .  olmesartan (BENICAR) 20 MG tablet, Take 20 mg by mouth daily., Disp: , Rfl:  .  OVER THE COUNTER MEDICATION, , Disp: , Rfl:  .  pantoprazole (PROTONIX) 40 MG tablet, TAKE 1 TABLET(40 MG) BY MOUTH DAILY, Disp: 30 tablet, Rfl: 9 .  Polyvinyl Alcohol-Povidone (REFRESH OP), Apply to eye daily., Disp: , Rfl:  .  Pregabalin ER (LYRICA CR) 165 MG TB24, Take 1 tablet by mouth 3 (three) times daily. , Disp: , Rfl:  .  sitaGLIPtin-metformin (JANUMET) 50-500 MG tablet, Take 1 tablet by mouth 2 (two) times daily with a meal., Disp: , Rfl:  .  traMADol (ULTRAM) 50 MG tablet, Take 1 tablet (50 mg total) by mouth every 6 (six) hours as needed., Disp: 40 tablet, Rfl: 0 .  travoprost, benzalkonium, (TRAVATAN) 0.004 % ophthalmic solution, Place 1 drop into the right eye at bedtime. , Disp: , Rfl:    Allergies  Allergen Reactions  . Crestor [Rosuvastatin Calcium] Other (See Comments)    muscle aches  . Demerol  [Meperidine Hcl]     Other reaction(s): Hallucinations  . Shellfish Allergy Itching and Other (See Comments)    Crab, shrimp and lobster ---lips itch and tingle Tingling  Was told not to eat again after having a  allergy test. Lobster, crab and shrimp Crab, shrimp and lobster ---lips itch and tingle PATIENT STATES SHE HAD A POSITVE ALLERGY TEST   . Demerol [Meperidine] Other (See Comments)    Hallucinations      Review of Systems  Constitutional: Negative.   Eyes: Negative for blurred vision.  Respiratory: Negative.   Cardiovascular: Negative.  Negative for chest pain and palpitations.  Gastrointestinal: Negative.   Neurological: Negative.   Psychiatric/Behavioral: Negative.      Today's Vitals   03/18/18  1150  BP: (!) 144/86  Pulse: 73  Temp: 98.1 F (36.7 C)  TempSrc: Oral  Weight: 175 lb 9.6 oz (79.7 kg)  Height: 5\' 2"  (1.575 m)  PainSc: 7   PainLoc: Hip   Body mass index is 32.12 kg/m.   Objective:  Physical Exam Vitals signs and nursing note reviewed.  Constitutional:      Appearance: Normal appearance. She is obese.  HENT:     Head: Normocephalic and atraumatic.  Cardiovascular:     Rate and Rhythm: Normal rate and regular rhythm.     Heart sounds: Normal heart sounds.  Pulmonary:     Effort: Pulmonary effort is normal.     Breath sounds: Normal breath sounds.  Skin:    General: Skin is warm.  Neurological:     General: No focal deficit present.     Mental Status: She is alert.  Psychiatric:        Mood and Affect: Mood normal.        Behavior: Behavior normal.         Assessment And Plan:     1. Obesity, diabetes, and hypertension syndrome ([place])  I will check labs as listed below.  She is encouraged to avoid sugary beverages and processed foods. Her blood pressure was initially elevated, repeat bp minutes later was within normal limits.   She is encouraged to avoid adding salt to her foods.   - Lipid panel - Hemoglobin A1c - POCT Urinalysis Dipstick (81002) - POCT UA - Microalbumin  2. Coronary artery disease involving native coronary artery of native heart with angina pectoris ([facility])  Chronic. She is also followed by cardiology. She is encouraged to  consider a Mediterranean diet.    3. Pure hypercholesterolemia  SHE IS ENCOURAGED TO EXERCISE FIVE DAYS WEEKLY FOR AT LEAST 30 MINUTES, AVOID FRIED FOODS, EAT 25-35 GRAMS OF FIBER, AND TO EAT FISH TWICE WEEKLY.   4. Body mass index (BMI) of 32.0-32.9 in adult  She is encouraged to strive for BMI less than 29 to decrease cardiac risk. I did acknowledge the arthralgias she experiences, so I encouraged her to consider water aerobics. She can start with walking in the pool. She reports she will consider this, yet she seems hesitant.         Maximino Greenland, MD

## 2018-03-29 ENCOUNTER — Telehealth: Payer: Self-pay

## 2018-03-29 ENCOUNTER — Telehealth: Payer: Self-pay | Admitting: *Deleted

## 2018-03-29 NOTE — Telephone Encounter (Signed)
   [facility] Pre-operative Risk Assessment    Request for surgical clearance:  1. What type of surgery is being performed? RIGHT UPPER LID PTOSIS REPAIR BY LEVATOR ADVANCEMENT    2. When is this surgery scheduled? 04/07/18   3. What type of clearance is required (medical clearance vs. Pharmacy clearance to hold med vs. Both)? MEDICAL  4. Are there any medications that need to be held prior to surgery and how long?PLAVIX AND ASA HOLD 5-7 DAYS PRIOR    5. Practice name and name of physician performing surgery? [facility] ; DR. HIDAJI  6. What is your office phone number [phone]    7.   What is your office fax number [phone]  8.   Anesthesia type (None, local, MAC, general) ? LEFT MESSAGE TO VERIFY TYPE OF ANESTHESIA   Julaine Hua 03/29/2018, 2:08 PM  _________________________________________________________________   (provider comments below)

## 2018-03-29 NOTE — Telephone Encounter (Signed)
Spoke with pt who needed to reschedule her appt with Dr. Acie Fredrickson on 2/24. Pt rescheduled for 4/3 @ 8:20 am with Dr. Acie Fredrickson.

## 2018-03-29 NOTE — Telephone Encounter (Signed)
   Primary Cardiologist: Mertie Moores, MD   Chart reviewed as part of pre-operative protocol coverage. Patient had RIGHT UPPER LID PTOSIS REPAIR 02/09/18. Tolerated well. Recent stress test low risk.   Given past medical history and time since last visit, based on ACC/AHA guidelines, Sheila Reynolds would be at acceptable risk for the planned procedure without further cardiovascular testing. She can hold ASA and Plavix for 5-7 days prior.   I will route this recommendation to the requesting party via Epic fax function and remove from pre-op pool.  Please call with questions.  [place], [place] 03/29/2018, 2:30 PM

## 2018-04-05 ENCOUNTER — Ambulatory Visit: Payer: Medicare Other | Admitting: Cardiovascular Disease

## 2018-04-06 ENCOUNTER — Ambulatory Visit: Payer: Medicare Other | Admitting: Cardiovascular Disease

## 2018-04-12 ENCOUNTER — Ambulatory Visit (INDEPENDENT_AMBULATORY_CARE_PROVIDER_SITE_OTHER): Payer: Medicare Other

## 2018-04-12 ENCOUNTER — Telehealth: Payer: Self-pay | Admitting: Cardiovascular Disease

## 2018-04-12 ENCOUNTER — Other Ambulatory Visit: Payer: Self-pay | Admitting: Cardiovascular Disease

## 2018-04-12 DIAGNOSIS — R002 Palpitations: Secondary | ICD-10-CM | POA: Diagnosis not present

## 2018-04-12 NOTE — Telephone Encounter (Signed)
New Message   Patient c/o Palpitations:  High priority if patient c/o lightheadedness, shortness of breath, or chest pain  1) How long have you had palpitations/irregular HR/ Afib? Are you having the symptoms now? Over a week, Yes symptoms now   2) Are you currently experiencing lightheadedness, SOB or CP? No  3) Do you have a history of afib (atrial fibrillation) or irregular heart rhythm? Yes  4) Have you checked your BP or HR? (document readings if available): Not today   5) Are you experiencing any other symptoms? Chest, kneck and shoulders, she says she feels fluttering

## 2018-04-12 NOTE — Telephone Encounter (Signed)
Spoke with patient who states she has been having heart fluttering x 2-3 weeks. States symptoms have been worse due to the death of her brother and rushing to [place] where he lived. States fluttering started prior to his death and occur intermittently throughout the day and night. States she took a NTG to see if it helped which it did help some; states took a "nerve pill" which was more helpful than NTG. She denies other symptoms and admits to missing some doses of medication due to stress and travel. I explained that she will likely need a monitor placed and she verbalized understanding and agreement. I advised that I will work on an appointment and call her back with plan of care. She verbalized understanding and agreement and thanked me for the call.

## 2018-04-12 NOTE — Telephone Encounter (Signed)
Spoke with Darrick Penna, monitor technician who agreed that patient may come in today at 11:30 to have an event monitor placed. Verified plan of care with Dr. Acie Fredrickson. Patient is aware of appointment and agrees with plan of care. She thanked me for the call.

## 2018-04-22 ENCOUNTER — Other Ambulatory Visit: Payer: Self-pay

## 2018-04-22 ENCOUNTER — Ambulatory Visit (INDEPENDENT_AMBULATORY_CARE_PROVIDER_SITE_OTHER): Payer: Medicare Other | Admitting: Internal Medicine

## 2018-04-22 ENCOUNTER — Encounter: Payer: Self-pay | Admitting: Internal Medicine

## 2018-04-22 VITALS — BP 138/70 | HR 94 | Ht 62.0 in | Wt 172.0 lb

## 2018-04-22 DIAGNOSIS — D869 Sarcoidosis, unspecified: Secondary | ICD-10-CM | POA: Diagnosis not present

## 2018-04-22 DIAGNOSIS — G4733 Obstructive sleep apnea (adult) (pediatric): Secondary | ICD-10-CM

## 2018-04-22 DIAGNOSIS — J441 Chronic obstructive pulmonary disease with (acute) exacerbation: Secondary | ICD-10-CM | POA: Diagnosis not present

## 2018-04-22 DIAGNOSIS — I25119 Atherosclerotic heart disease of native coronary artery with unspecified angina pectoris: Secondary | ICD-10-CM

## 2018-04-22 DIAGNOSIS — R6889 Other general symptoms and signs: Secondary | ICD-10-CM | POA: Diagnosis not present

## 2018-04-22 LAB — RESPIRATORY VIRUS PANEL
Influenza A RNA: NOT DETECTED
Influenza B RNA: NOT DETECTED
RSV RNA: NOT DETECTED
hMPV: NOT DETECTED

## 2018-04-22 LAB — POCT INFLUENZA A/B
Influenza A, POC: NEGATIVE
Influenza B, POC: NEGATIVE

## 2018-04-22 MED ORDER — LEVALBUTEROL HCL 0.63 MG/3ML IN NEBU
0.6300 mg | INHALATION_SOLUTION | Freq: Once | RESPIRATORY_TRACT | Status: AC
  Administered 2018-04-22: 0.63 mg via RESPIRATORY_TRACT

## 2018-04-22 MED ORDER — AZITHROMYCIN 250 MG PO TABS: ORAL_TABLET | ORAL | 0 refills | Status: DC

## 2018-04-22 MED ORDER — ALBUTEROL SULFATE (2.5 MG/3ML) 0.083% IN NEBU: 2.5000 mg | INHALATION_SOLUTION | Freq: Every day | RESPIRATORY_TRACT | 12 refills | Status: DC | PRN

## 2018-04-22 MED ORDER — COMPRESSOR/NEBULIZER MISC: 0 refills | Status: DC

## 2018-04-22 MED ORDER — METHYLPREDNISOLONE ACETATE 80 MG/ML IJ SUSP
80.0000 mg | Freq: Once | INTRAMUSCULAR | Status: AC
  Administered 2018-04-22: 80 mg via INTRAMUSCULAR

## 2018-04-22 NOTE — Assessment & Plan Note (Signed)
She was mild on original testing 5 years ago. Discussed reconsideration at next visit, possibly HST then. She may be better off with no Rx or an alternative to CPAP.

## 2018-04-22 NOTE — Assessment & Plan Note (Signed)
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

## 2018-04-22 NOTE — Progress Notes (Signed)
HPI  F former smoker followed for bronchitis, hx  Occular sarcoid, bronchitis/ nodules, OSA complicated by GERD, glaucoma, DM2 NPSG 12/25/03- AHI  2.9/ hr, desaturation to 91%, body weight 164 lbs NPSG-10/18/13- Mild OSA, AHI 9.3/ hr, weight 164 lbs  ACE 09/17/15-47-WNL ACE level 08/18/16-30 PFT: 01/07/2011-FEV1 1.59/79%, FEV1/FVC 0.73, FEF 25-75% improved to 38% with bronchodilator. TLC 90% DLCO 55% Office Spirometry 06/09/16- moderate restriction of exhaled volume. FVC 1.51/68%, FEV1 1.20/70%, ratio 0.79, FEF 25-75% 1.19/73% Nuclear Stress Test 09/04/16- EF 65%, low risk. ECHO 07/31/16- Gr 1 DD Walk test on room air 12/21/2017-minimal saturation 94%, maximum heart rate 118/minute with no stops walking 3 laps x180 m. --------------------------------------------------------------------------------------------------------------- 12/21/2017- 73 yo female former smoker followed for bronchitis/bronchiolitis/lung nodules, history of ocular sarcoid, OSA, complicated by GERD, Glaucoma, DM 2, CAD/ stent CPAP 9/Advanced -----OSA: DME: [facility]  Pt states she is trying to wear CPAP most nights but states she is concerned with pressure settings, getting use to the machine, and face mask isses. Pt will need order for new supplies DL attached  MTX, Dulera 200, neb albuterol, Download 47% compliance, reflecting 2 weeks last month when she did not wear it.  AHI 0.6/hour. Says she is felt short of breath all summer, mainly with exertion.  Pains under her left breast around to back severe/intermittent over past 4 months unrelated to exertion, lifting or movement.  Increased cough productive clear or yellow sputum but without fever or sweat. Office Spirometry 12/21/2017-severe obstruction with restriction of exhaled volume.  FVC 1.1/52%, FEV1 0.9/55%, ratio 1.81, FEF 25-75% 0.9/58% Walk test on room air 12/21/2017-minimum saturation 94%, maximum heart rate 118/minute with no stops walking 3 laps x180 m.  04/22/2018- 73 yo  female former smoker followed for COPD/bronchiolitis/lung nodules, history of ocular sarcoid/ MTX, OSA, complicated by GERD, Glaucoma, DM 2, CAD/ stent CPAP 9/Advanced- no download Reports bothersome airleak with CPAP mask.  Now sick x 3 days with runny nose, some green from chest, sore throat, night sweats. Denies body aches, nodes, rash. Had flu vax. Travelling back and forth over last 2 weeks between [place] and [place]. Had brother in hosp for leg surgery (no respiratory) - he passed unexpectedly and she is grieving. Has been exposed to daughter and granddaughter who have had similar URI symptoms. She asks for help with her COPD related dyspnea which is persistent. No longer has neb solution and machine is old. Avoiding LAMAs due to glaucoma. No recent abx. CXR 12/21/17- No edema or consolidation.  Aortic atherosclerosis. Aortic Atherosclerosis (ICD10-I70.0). Nasal swab rapid flu test- NEG Resp Viral Panel swab- pending   ROS-see HPI     + = positive Constitutional:   No-   weight loss, night sweats, fevers, chills, fatigue, lassitude. HEENT:   +  headaches, difficulty swallowing, tooth/dental problems, sore throat,       Some  sneezing, itching, ear ache, +nasal congestion, post nasal drip,  CV: + chest pain, no-orthopnea, PND, swelling in lower extremities, anasarca, dizziness, palpitations Resp: +  shortness of breath with exertion not at rest.            +productive cough,  + non-productive cough,  No- coughing up of blood.               in color of mucus.   wheezing.   Skin: No-   rash or lesions. GI:  +  heartburn, indigestion, abdominal pain, nausea, vomiting,  GU:  MS:  +  joint pain or swelling, + back pain Neuro-  nothing unusual Psych:  No- change in mood or affect. No depression or anxiety.  No memory loss.  OBJ  Afebrile General- Alert, Oriented, Affect tearful, Distress- none acute, + overweight Skin- rash-none, lesions- none, excoriation- none Lymphadenopathy-  none Head- atraumatic            Eyes- Gross vision intact, PERRLA, conjunctivae clear secretions- not injected            Ears- Hearing aid             Nose- + turbinate edema, no-Septal dev, mucus, polyps, erosion, perforation             Throat- Mallampati III , mucosa +lightly coated , drainage- none, tonsils- atrophic Neck- flexible , trachea midline, no stridor , thyroid nl, carotid no bruit Chest - symmetrical excursion , unlabored           Heart/CV- RRR , no murmur , no gallop  , no rub, nl s1 s2                           - JVD- none , edema- none, stasis changes- none, varices- none           Lung-  Cough- none , wheeze-none, rhonchi-none , dullness-none, rub- none           Chest wall-  Abd- No HSM Br/ Gen/ Rectal- Not done, not indicated Extrem- cyanosis- none, clubbing, none, atrophy- none, strength- nl.  Neuro- grossly intact to observation

## 2018-04-22 NOTE — Assessment & Plan Note (Signed)
Respiratory viral syndrome. Exposed to family members who have resolved similar symptoms, and she has had flu vax. Respecting current concerns, we will check available swabs for flu and for respiratory viral panel. Recommend she remain at home, avoid crowds and other people, with caution that if she worsens dramatically this weekend, she will go to ER. Zpak to hold. Replace old nebulizer/ albuterol through DME.

## 2018-04-22 NOTE — Patient Instructions (Signed)
Order- nasal flu swab  Stay at home and avoid crowds, try to stay away from other people until you feel better.  Script sent for Zpak antibiotic to take if you note more fever, green sputum  Stay well-hydrated  Ok to take symptom medicines over the counter for cold and flu symptoms if they help  Order- Scripts printed to establish nebulizer machine and albuterol neb solution with a DME company  Order- neb xop 0.63   COPD exacerbation              Depo 80  We can look at what to do about CPAP next time.  If you start feeling a lot worse in the next few day, go to the ER

## 2018-05-17 DIAGNOSIS — H35352 Cystoid macular degeneration, left eye: Secondary | ICD-10-CM | POA: Diagnosis not present

## 2018-05-17 DIAGNOSIS — H20013 Primary iridocyclitis, bilateral: Secondary | ICD-10-CM | POA: Diagnosis not present

## 2018-05-17 DIAGNOSIS — H3581 Retinal edema: Secondary | ICD-10-CM | POA: Diagnosis not present

## 2018-05-17 DIAGNOSIS — H35373 Puckering of macula, bilateral: Secondary | ICD-10-CM | POA: Diagnosis not present

## 2018-05-18 ENCOUNTER — Telehealth: Payer: Self-pay | Admitting: Cardiovascular Disease

## 2018-05-18 NOTE — Telephone Encounter (Signed)
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 I advised her to call back with questions or concerns prior to August office visit. She verbalized understanding and agreement with plan of care and thanked me for the call.

## 2018-05-18 NOTE — Telephone Encounter (Signed)
New message   Patient is returning call for monitor results.

## 2018-05-20 ENCOUNTER — Other Ambulatory Visit: Payer: Self-pay | Admitting: Internal Medicine

## 2018-05-20 NOTE — Telephone Encounter (Signed)
Pregbalin refill

## 2018-05-28 ENCOUNTER — Other Ambulatory Visit: Payer: Self-pay | Admitting: Internal Medicine

## 2018-06-03 ENCOUNTER — Ambulatory Visit: Payer: Medicare Other | Admitting: Rheumatology

## 2018-06-11 ENCOUNTER — Encounter: Payer: Self-pay | Admitting: Internal Medicine

## 2018-06-15 ENCOUNTER — Other Ambulatory Visit: Payer: Self-pay | Admitting: Physician Assistant

## 2018-06-16 ENCOUNTER — Ambulatory Visit: Payer: Medicare Other | Admitting: Rheumatology

## 2018-06-25 DIAGNOSIS — H2013 Chronic iridocyclitis, bilateral: Secondary | ICD-10-CM | POA: Diagnosis not present

## 2018-06-28 ENCOUNTER — Ambulatory Visit (INDEPENDENT_AMBULATORY_CARE_PROVIDER_SITE_OTHER): Payer: Medicare Other | Admitting: Internal Medicine

## 2018-06-28 ENCOUNTER — Encounter: Payer: Self-pay | Admitting: Internal Medicine

## 2018-06-28 ENCOUNTER — Telehealth: Payer: Self-pay | Admitting: Internal Medicine

## 2018-06-28 ENCOUNTER — Other Ambulatory Visit: Payer: Self-pay

## 2018-06-28 VITALS — BP 124/60 | HR 83 | Temp 98.2°F | Ht 61.0 in | Wt 169.6 lb

## 2018-06-28 DIAGNOSIS — R0789 Other chest pain: Secondary | ICD-10-CM | POA: Diagnosis not present

## 2018-06-28 DIAGNOSIS — G4733 Obstructive sleep apnea (adult) (pediatric): Secondary | ICD-10-CM

## 2018-06-28 DIAGNOSIS — I25119 Atherosclerotic heart disease of native coronary artery with unspecified angina pectoris: Secondary | ICD-10-CM | POA: Diagnosis not present

## 2018-06-28 MED ORDER — ACETAMINOPHEN-CODEINE #3 300-30 MG PO TABS: ORAL_TABLET | ORAL | 0 refills | Status: DC

## 2018-06-28 MED ORDER — ALBUTEROL SULFATE HFA 108 (90 BASE) MCG/ACT IN AERS: 2.0000 | INHALATION_SPRAY | Freq: Four times a day (QID) | RESPIRATORY_TRACT | 12 refills | Status: DC | PRN

## 2018-06-28 NOTE — Telephone Encounter (Signed)
Pharmacy calling to clarify script for tylenol #3. Rx reads "take1-2 tabs every hours for severe pain" how many hours?  Dr. Annamaria Boots please advise  Allergies  Allergen Reactions  . Crestor [Rosuvastatin Calcium] Other (See Comments)    muscle aches  . Demerol  [Meperidine Hcl]     Other reaction(s): Hallucinations  . Shellfish Allergy Itching and Other (See Comments)    Crab, shrimp and lobster ---lips itch and tingle Tingling  Was told not to eat again after having a allergy test. Lobster, crab and shrimp Crab, shrimp and lobster ---lips itch and tingle PATIENT STATES SHE HAD A POSITVE ALLERGY TEST   . Demerol [Meperidine] Other (See Comments)    Hallucinations      Current Outpatient Medications on File Prior to Visit  Medication Sig Dispense Refill  . acetaminophen (TYLENOL) 500 MG tablet Take 1,000 mg by mouth 2 (two) times daily as needed for moderate pain or headache.    [name] acetaminophen-codeine (TYLENOL #3) 300-30 MG tablet 1-2 tabs every hours for severe pain 30 tablet 0  . albuterol (PROVENTIL) (2.5 MG/3ML) 0.083% nebulizer solution Take 3 mLs (2.5 mg total) by nebulization daily as needed for wheezing or shortness of breath. 75 mL 12  . albuterol (VENTOLIN HFA) 108 (90 Base) MCG/ACT inhaler Inhale 2 puffs into the lungs every 6 (six) hours as needed for wheezing or shortness of breath. 1 Inhaler 12  . aspirin EC 81 MG tablet Take 81 mg by mouth at bedtime.     [name] atorvastatin (LIPITOR) 40 MG tablet TAKE 1 TABLET(40 MG) BY MOUTH DAILY 30 tablet 8  . azithromycin (ZITHROMAX) 250 MG tablet 2 today then one daily 6 tablet 0  . brimonidine (ALPHAGAN P) 0.1 % SOLN Place 1 drop into the left eye 3 (three) times daily.     . brinzolamide (AZOPT) 1 % ophthalmic suspension Place 1 drop into the left eye 3 (three) times daily.     . carvedilol (COREG) 12.5 MG tablet Take 1 tablet (12.5 mg total) by mouth 2 (two) times daily. 180 tablet 3  . clopidogrel (PLAVIX) 75 MG tablet Take 1 tablet  (75 mg total) by mouth daily. 90 tablet 3  . folic acid (FOLVITE) 1 MG tablet Take 2 tablets (2 mg total) by mouth daily. 180 tablet 3  . furosemide (LASIX) 20 MG tablet TAKE 1 TABLET BY MOUTH EVERY 3 DAYS 45 tablet 3  . guaiFENesin (ROBITUSSIN) 100 MG/5ML SOLN Take 5 mLs by mouth every 6 (six) hours as needed for cough or to loosen phlegm.     . isosorbide mononitrate (IMDUR) 60 MG 24 hr tablet Take 1 tablet (60 mg total) by mouth daily. 90 tablet 3  . Lancets (ONETOUCH DELICA PLUS [id number]) MISC CHECK BLOOD SUGAR BEFORE BREAKFAST AND DINNER 100 each 1  . loteprednol (LOTEMAX) 0.5 % ophthalmic suspension See admin instructions. Instill 8 DROPS IN LEFT EYE 3 IN RIGHT EYE DAILY    . methotrexate (RHEUMATREX) 2.5 MG tablet Take 8 tablets (20 mg total) by mouth once a week. Caution:Chemotherapy. Protect from light. 96 tablet 0  . montelukast (SINGULAIR) 10 MG tablet TAKE 1 TABLET BY MOUTH EVERY EVENING 90 tablet 1  . Nebulizers (COMPRESSOR/NEBULIZER) MISC Use as directed 1 each 0  . nitroGLYCERIN (NITROSTAT) 0.4 MG SL tablet Place 1 tablet (0.4 mg total) under the tongue every 5 (five) minutes as needed for chest pain. 25 tablet prn  . olmesartan (BENICAR) 20 MG tablet Take 20 mg by mouth  daily.    [name] OVER THE COUNTER MEDICATION     . pantoprazole (PROTONIX) 40 MG tablet TAKE 1 TABLET(40 MG) BY MOUTH DAILY 30 tablet 7  . Polyvinyl Alcohol-Povidone (REFRESH OP) Apply to eye daily.    . pregabalin (LYRICA) 75 MG capsule TAKE ONE CAPSULE BY MOUTH THREE TIMES DAILY 90 capsule 3  . Pregabalin ER (LYRICA CR) 165 MG TB24 Take 1 tablet by mouth 3 (three) times daily.     . sitaGLIPtin-metformin (JANUMET) 50-500 MG tablet Take 1 tablet by mouth 2 (two) times daily with a meal.    . traMADol (ULTRAM) 50 MG tablet Take 1 tablet (50 mg total) by mouth every 6 (six) hours as needed. 40 tablet 0  . travoprost, benzalkonium, (TRAVATAN) 0.004 % ophthalmic solution Place 1 drop into the right eye at bedtime.       No current facility-administered medications on file prior to visit.    [name]

## 2018-06-28 NOTE — Progress Notes (Signed)
HPI  F former smoker followed for bronchitis, hx  Occular sarcoid, bronchitis/ nodules, OSA complicated by GERD, glaucoma, DM2 NPSG 12/25/03- AHI  2.9/ hr, desaturation to 91%, body weight 164 lbs NPSG-10/18/13- Mild OSA, AHI 9.3/ hr, weight 164 lbs  ACE 09/17/15-47-WNL ACE level 08/18/16-30 PFT: 01/07/2011-FEV1 1.59/79%, FEV1/FVC 0.73, FEF 25-75% improved to 38% with bronchodilator. TLC 90% DLCO 55% Office Spirometry 06/09/16- moderate restriction of exhaled volume. FVC 1.51/68%, FEV1 1.20/70%, ratio 0.79, FEF 25-75% 1.19/73% Nuclear Stress Test 09/04/16- EF 65%, low risk. ECHO 07/31/16- Gr 1 DD Walk test on room air 12/21/2017-minimal saturation 94%, maximum heart rate 118/minute with no stops walking 3 laps x180 m. Office Spirometry 12/21/17 --------------------------------------------------------------------------------------------------------------- 04/22/2018- 73 yo female former smoker followed for COPD/bronchiolitis/lung nodules, history of ocular sarcoid/ MTX, OSA, complicated by GERD, Glaucoma, DM 2, CAD/ stent CPAP 9/Advanced- no download Reports bothersome airleak with CPAP mask.  Now sick x 3 days with runny nose, some green from chest, sore throat, night sweats. Denies body aches, nodes, rash. Had flu vax. Travelling back and forth over last 2 weeks between [place] and [place]. Had brother in hosp for leg surgery (no respiratory) - he passed unexpectedly and she is grieving. Has been exposed to daughter and granddaughter who have had similar URI symptoms. She asks for help with her COPD related dyspnea which is persistent. No longer has neb solution and machine is old. Avoiding LAMAs due to glaucoma. No recent abx. CXR 12/21/17- No edema or consolidation.  Aortic atherosclerosis. Aortic Atherosclerosis (ICD10-I70.0). Nasal swab rapid flu test- NEG Resp Viral Panel swab- pending  06/28/2018- 72 yo female former smoker followed for COPD/bronchiolitis/lung nodules, history of ocular  sarcoid/ MTX, OSA, complicated by GERD, Glaucoma, DM 2, CAD/ stent, DDD lumbar spine CPAP 9/Advanced- no download ------Pt states she has constant pain 10/10 on her L mid-back and across to lower breast area for the last 2 mo; has tried Tylenol, hemp cream, heat patches without resolve Doesn't remember any acute event, trauma or associated rash. Known degenerative disk disease.   ROS-see HPI     + = positive Constitutional:   No-   weight loss, night sweats, fevers, chills, fatigue, lassitude. HEENT:   +  headaches, difficulty swallowing, tooth/dental problems, sore throat,       Some  sneezing, itching, ear ache, +nasal congestion, post nasal drip,  CV: + chest pain, no-orthopnea, PND, swelling in lower extremities, anasarca, dizziness, palpitations Resp: +  shortness of breath with exertion not at rest.            +productive cough,  + non-productive cough,  No- coughing up of blood.               in color of mucus.   wheezing.   Skin: No-   rash or lesions. GI:  +  heartburn, indigestion, abdominal pain, nausea, vomiting,  GU:  MS:  +  joint pain or swelling, + back pain Neuro-     nothing unusual Psych:  No- change in mood or affect. No depression or anxiety.  No memory loss.  OBJ  Afebrile General- Alert, Oriented, Affect tearful, Distress- none acute, + overweight Skin- rash-none, lesions- none, excoriation- none Lymphadenopathy- none Head- atraumatic            Eyes- Gross vision intact, PERRLA, conjunctivae clear secretions- not injected            Ears- Hearing aid             Nose- +  turbinate edema, no-Septal dev, mucus, polyps, erosion, perforation             Throat- Mallampati III , mucosa +lightly coated , drainage- none, tonsils- atrophic Neck- flexible , trachea midline, no stridor , thyroid nl, carotid no bruit Chest - symmetrical excursion , unlabored           Heart/CV- RRR , no murmur , no gallop  , no rub, nl s1 s2                           - JVD- none , edema-  none, stasis changes- none, varices- none           Lung-  Cough- none , wheeze-none, rhonchi-none , dullness-none, rub- none           Chest wall-  Abd- No HSM Br/ Gen/ Rectal- Not done, not indicated Extrem- cyanosis- none, clubbing, none, atrophy- none, strength- nl.  Neuro- grossly intact to observation

## 2018-06-28 NOTE — Telephone Encounter (Signed)
Sorry - every 6 hours

## 2018-06-28 NOTE — Telephone Encounter (Signed)
Called and spoke w/ pharmacy tech in regards to frequency of pt's recently prescribed Tylenol #3. I informed pharmacy tech of CY's recommendation of every 6 hours. Pharmacy tech verbalized understanding. Nothing further needed at this time.

## 2018-06-28 NOTE — Patient Instructions (Addendum)
Order- Radiology CT Thoracic Spine-  Persistent Left chest wall pain approx T 5,6,7 dermatome  Script sent Tylenol with Codeine # III     Use sparingly to avoid excessive sedation

## 2018-06-29 ENCOUNTER — Telehealth: Payer: Self-pay | Admitting: *Deleted

## 2018-06-29 NOTE — Telephone Encounter (Signed)
Pt verified, compeleted screening and confirmed appt for CT.   COVID-19 Pre-Screening Questions:  In the past 7 to 10 days have you had a cough, shortness of breath, headache, congestion, fever, body aches, chills, sore throat, or sudden loss of taste or sense of smell? No  Have you been around anyone with known Covid 19. No  Have you been around anyone who is awaiting Covid 19 test results in the past 7 to 10 days? No  Have you been around anyone who has been exposed to Covid 19, or has mentioned symptoms of Covid 19 within the past 7 to 10 days? No If you have any concerns about symptoms your patients report please contact your leadership team, or the provider the patient is seeing in the office for further guidance.

## 2018-06-30 ENCOUNTER — Ambulatory Visit (INDEPENDENT_AMBULATORY_CARE_PROVIDER_SITE_OTHER)
Admission: RE | Admit: 2018-06-30 | Discharge: 2018-06-30 | Disposition: A | Discharge status: Home / Self Care | Payer: Medicare Other | Source: Ambulatory Visit | Attending: Internal Medicine | Admitting: Internal Medicine

## 2018-06-30 ENCOUNTER — Other Ambulatory Visit: Payer: Self-pay

## 2018-06-30 DIAGNOSIS — R0789 Other chest pain: Secondary | ICD-10-CM | POA: Diagnosis not present

## 2018-07-01 ENCOUNTER — Telehealth: Payer: Self-pay | Admitting: *Deleted

## 2018-07-01 NOTE — Telephone Encounter (Signed)
Attempted to contact the patient and left message for patient to call the office. Received office notes from patient;s opthalmologist. Patient is taking MTX 8 tabs weekly and is improved but still symptomatic. Per Dr. Estanislado Pandy needs a follow up appointment.

## 2018-07-08 ENCOUNTER — Other Ambulatory Visit: Payer: Self-pay | Admitting: Internal Medicine

## 2018-07-20 ENCOUNTER — Encounter: Payer: Self-pay | Admitting: Internal Medicine

## 2018-07-20 ENCOUNTER — Ambulatory Visit (INDEPENDENT_AMBULATORY_CARE_PROVIDER_SITE_OTHER): Payer: Medicare Other | Admitting: Internal Medicine

## 2018-07-20 ENCOUNTER — Other Ambulatory Visit: Payer: Self-pay

## 2018-07-20 VITALS — BP 114/76 | HR 84 | Temp 97.5°F | Ht 61.0 in | Wt 171.4 lb

## 2018-07-20 DIAGNOSIS — E1159 Type 2 diabetes mellitus with other circulatory complications: Secondary | ICD-10-CM | POA: Diagnosis not present

## 2018-07-20 DIAGNOSIS — I25119 Atherosclerotic heart disease of native coronary artery with unspecified angina pectoris: Secondary | ICD-10-CM | POA: Diagnosis not present

## 2018-07-20 DIAGNOSIS — R059 Cough, unspecified: Secondary | ICD-10-CM

## 2018-07-20 DIAGNOSIS — E7800 Pure hypercholesterolemia, unspecified: Secondary | ICD-10-CM

## 2018-07-20 DIAGNOSIS — Z79899 Other long term (current) drug therapy: Secondary | ICD-10-CM

## 2018-07-20 DIAGNOSIS — E1169 Type 2 diabetes mellitus with other specified complication: Secondary | ICD-10-CM | POA: Diagnosis not present

## 2018-07-20 DIAGNOSIS — R05 Cough: Secondary | ICD-10-CM | POA: Diagnosis not present

## 2018-07-20 DIAGNOSIS — I10 Essential (primary) hypertension: Secondary | ICD-10-CM

## 2018-07-20 DIAGNOSIS — R600 Localized edema: Secondary | ICD-10-CM

## 2018-07-20 DIAGNOSIS — E669 Obesity, unspecified: Secondary | ICD-10-CM

## 2018-07-20 NOTE — Patient Instructions (Signed)

## 2018-07-21 ENCOUNTER — Other Ambulatory Visit: Payer: Self-pay | Admitting: Internal Medicine

## 2018-07-21 ENCOUNTER — Other Ambulatory Visit: Payer: Medicare Other

## 2018-07-21 DIAGNOSIS — R600 Localized edema: Secondary | ICD-10-CM | POA: Diagnosis not present

## 2018-07-21 LAB — HEMOGLOBIN A1C
Est. average glucose Bld gHb Est-mCnc: 128 mg/dL
Hgb A1c MFr Bld: 6.1 % — ABNORMAL HIGH (ref 4.8–5.6)

## 2018-07-21 LAB — BMP8+EGFR
BUN/Creatinine Ratio: 18 (ref 12–28)
BUN: 13 mg/dL (ref 8–27)
CO2: 25 mmol/L (ref 20–29)
Calcium: 9.2 mg/dL (ref 8.7–10.3)
Chloride: 101 mmol/L (ref 96–106)
Creatinine, Ser: 0.73 mg/dL (ref 0.57–1.00)
GFR calc Af Amer: 96 mL/min/{1.73_m2} (ref >59)
GFR calc non Af Amer: 83 mL/min/{1.73_m2} (ref >59)
Glucose: 147 mg/dL — ABNORMAL HIGH (ref 65–99)
Potassium: 4.2 mmol/L (ref 3.5–5.2)
Sodium: 141 mmol/L (ref 134–144)

## 2018-07-21 LAB — VITAMIN B12: Vitamin B-12: 609 pg/mL (ref 232–1245)

## 2018-07-21 LAB — SAR COV2 SEROLOGY (COVID19)AB(IGG),IA: SARS-CoV-2 Ab, IgG: NEGATIVE

## 2018-07-22 LAB — BRAIN NATRIURETIC PEPTIDE: BNP: 51.1 pg/mL (ref 0.0–100.0)

## 2018-07-23 ENCOUNTER — Ambulatory Visit: Payer: Medicare Other | Admitting: Internal Medicine

## 2018-07-26 DIAGNOSIS — H401122 Primary open-angle glaucoma, left eye, moderate stage: Secondary | ICD-10-CM | POA: Diagnosis not present

## 2018-07-26 DIAGNOSIS — H401113 Primary open-angle glaucoma, right eye, severe stage: Secondary | ICD-10-CM | POA: Diagnosis not present

## 2018-07-26 DIAGNOSIS — H2013 Chronic iridocyclitis, bilateral: Secondary | ICD-10-CM | POA: Diagnosis not present

## 2018-08-01 NOTE — Progress Notes (Signed)
Subjective:     Patient ID: Sheila Reynolds , female    DOB: Feb 24, 1945 , 73 y.o.   MRN: [id number]   Chief Complaint  Patient presents with  . Diabetes  . Hyperlipidemia  . Edema    bilateral    HPI  Diabetes She presents for her follow-up diabetic visit. She has type 2 diabetes mellitus. Her disease course has been stable. There are no hypoglycemic associated symptoms. There are no hypoglycemic complications. Diabetic complications include heart disease. Risk factors for coronary artery disease include diabetes mellitus, dyslipidemia, hypertension, post-menopausal and sedentary lifestyle. Current diabetic treatment includes oral agent (monotherapy). Her weight is stable. She is following a diabetic diet. She never participates in exercise. Her breakfast blood glucose is taken between 7-8 am. Her breakfast blood glucose range is generally 90-110 mg/dl. An ACE inhibitor/angiotensin II receptor blocker is being taken. Eye exam is current.  Hyperlipidemia This is a chronic problem. The problem is controlled. Recent lipid tests were reviewed and are normal. Exacerbating diseases include diabetes. Current antihyperlipidemic treatment includes statins. The current treatment provides moderate improvement of lipids. Compliance problems include adherence to exercise.      Past Medical History:  Diagnosis Date  . Anemia    3 months ago anemic  . Anxiety    on meds  . Arthritis    "all over" (01/15/2017)  . Asthma   . Bronchitis with emphysema   . Chest pain   . Chronic bronchitis ([place])   . Coronary artery disease    a. 01/2017 she underwent orbital atherectomy/DES to the proxmal LAD and PTCA to ostial D2. 2D Echo 01/15/17 showed mild LVH, EF 60-65%, grade 1 DD.  . Family history of anesthesia complication    daughter N/V  . Fibromyalgia   . GERD (gastroesophageal reflux disease)    on meds  . Heart murmur   . History of hiatal hernia   . Hx of echocardiogram    Echo (03/2013):  Tech  limited; Mild focal basal septal hypertrophy, EF 60-65%, normal RVF  . Hyperlipidemia   . Hypertension   . Nonsustained ventricular tachycardia ([place])   . OSA on CPAP   . Premature atrial contractions   . PVC (premature ventricular contraction)    a. Holter 12/16: NSR, occ PAC,PVCs  . Sarcoidosis   . Type II diabetes mellitus ([facility])      Family History  Problem Relation Age of Onset  . Heart disease Father   . Diabetes Father   . Glaucoma Father   . Cancer Mother        unknown type, ?lung  . Heart disease Paternal Grandmother   . Cancer Paternal Grandmother        unknown type  . Diabetes Brother   . Glaucoma Brother      Current Outpatient Medications:  .  acetaminophen (TYLENOL) 500 MG tablet, Take 1,000 mg by mouth 2 (two) times daily as needed for moderate pain or headache., Disp: , Rfl:  .  albuterol (PROVENTIL) (2.5 MG/3ML) 0.083% nebulizer solution, Take 3 mLs (2.5 mg total) by nebulization daily as needed for wheezing or shortness of breath., Disp: 75 mL, Rfl: 12 .  albuterol (VENTOLIN HFA) 108 (90 Base) MCG/ACT inhaler, Inhale 2 puffs into the lungs every 6 (six) hours as needed for wheezing or shortness of breath., Disp: 1 Inhaler, Rfl: 12 .  aspirin EC 81 MG tablet, Take 81 mg by mouth at bedtime. , Disp: , Rfl:  .  atorvastatin (LIPITOR)  40 MG tablet, TAKE 1 TABLET(40 MG) BY MOUTH DAILY, Disp: 30 tablet, Rfl: 8 .  brimonidine (ALPHAGAN P) 0.1 % SOLN, Place 1 drop into the left eye 3 (three) times daily. , Disp: , Rfl:  .  brinzolamide (AZOPT) 1 % ophthalmic suspension, Place 1 drop into the left eye 3 (three) times daily. , Disp: , Rfl:  .  carvedilol (COREG) 12.5 MG tablet, Take 1 tablet (12.5 mg total) by mouth 2 (two) times daily., Disp: 180 tablet, Rfl: 3 .  clopidogrel (PLAVIX) 75 MG tablet, Take 1 tablet (75 mg total) by mouth daily., Disp: 90 tablet, Rfl: 3 .  folic acid (FOLVITE) 1 MG tablet, Take 2 tablets (2 mg total) by mouth daily., Disp: 180 tablet, Rfl:  3 .  furosemide (LASIX) 20 MG tablet, TAKE 1 TABLET BY MOUTH EVERY 3 DAYS, Disp: 45 tablet, Rfl: 3 .  guaiFENesin (ROBITUSSIN) 100 MG/5ML SOLN, Take 5 mLs by mouth every 6 (six) hours as needed for cough or to loosen phlegm. , Disp: , Rfl:  .  isosorbide mononitrate (IMDUR) 60 MG 24 hr tablet, Take 1 tablet (60 mg total) by mouth daily., Disp: 90 tablet, Rfl: 3 .  JANUMET 50-500 MG tablet, TAKE 1 TABLET BY MOUTH TWICE DAILY WITH A MEAL, Disp: 60 tablet, Rfl: 1 .  Lancets (ONETOUCH DELICA PLUS [id number]) MISC, CHECK BLOOD SUGAR BEFORE BREAKFAST AND DINNER, Disp: 100 each, Rfl: 1 .  loteprednol (LOTEMAX) 0.5 % ophthalmic suspension, See admin instructions. Instill 8 DROPS IN LEFT EYE 3 IN RIGHT EYE DAILY, Disp: , Rfl:  .  montelukast (SINGULAIR) 10 MG tablet, TAKE 1 TABLET BY MOUTH EVERY EVENING, Disp: 90 tablet, Rfl: 1 .  Nebulizers (COMPRESSOR/NEBULIZER) MISC, Use as directed, Disp: 1 each, Rfl: 0 .  nitroGLYCERIN (NITROSTAT) 0.4 MG SL tablet, Place 1 tablet (0.4 mg total) under the tongue every 5 (five) minutes as needed for chest pain., Disp: 25 tablet, Rfl: prn .  olmesartan (BENICAR) 20 MG tablet, Take 20 mg by mouth daily., Disp: , Rfl:  .  pantoprazole (PROTONIX) 40 MG tablet, TAKE 1 TABLET(40 MG) BY MOUTH DAILY, Disp: 30 tablet, Rfl: 7 .  Polyvinyl Alcohol-Povidone (REFRESH OP), Apply to eye daily., Disp: , Rfl:  .  pregabalin (LYRICA) 75 MG capsule, TAKE ONE CAPSULE BY MOUTH THREE TIMES DAILY, Disp: 90 capsule, Rfl: 3 .  traMADol (ULTRAM) 50 MG tablet, Take 1 tablet (50 mg total) by mouth every 6 (six) hours as needed., Disp: 40 tablet, Rfl: 0 .  travoprost, benzalkonium, (TRAVATAN) 0.004 % ophthalmic solution, Place 1 drop into the right eye at bedtime. , Disp: , Rfl:  .  methotrexate (RHEUMATREX) 2.5 MG tablet, Take 8 tablets (20 mg total) by mouth once a week. Caution:Chemotherapy. Protect from light. (Patient not taking: Reported on 07/20/2018), Disp: 96 tablet, Rfl: 0 .  OVER THE COUNTER  MEDICATION, , Disp: , Rfl:    Allergies  Allergen Reactions  . Crestor [Rosuvastatin Calcium] Other (See Comments)    muscle aches  . Demerol  [Meperidine Hcl]     Other reaction(s): Hallucinations  . Shellfish Allergy Itching and Other (See Comments)    Crab, shrimp and lobster ---lips itch and tingle Tingling  Was told not to eat again after having a allergy test. Lobster, crab and shrimp Crab, shrimp and lobster ---lips itch and tingle PATIENT STATES SHE HAD A POSITVE ALLERGY TEST   . Demerol [Meperidine] Other (See Comments)    Hallucinations  Review of Systems  Constitutional: Negative.   Respiratory: Negative.   Cardiovascular: Positive for leg swelling.  Gastrointestinal: Negative.   Neurological: Negative.   Psychiatric/Behavioral: Negative.      Today's Vitals   07/20/18 1139  BP: 114/76  Pulse: 84  Temp: (!) 97.5 F (36.4 C)  TempSrc: Oral  Weight: 171 lb 6.4 oz (77.7 kg)  Height: 5' 1" (1.549 m)  PainSc: 4   PainLoc: Foot   Body mass index is 32.39 kg/m.   Objective:  Physical Exam Vitals signs and nursing note reviewed.  Constitutional:      Appearance: Normal appearance.  HENT:     Head: Normocephalic and atraumatic.  Cardiovascular:     Rate and Rhythm: Normal rate and regular rhythm.     Heart sounds: Normal heart sounds.  Pulmonary:     Effort: Pulmonary effort is normal.     Breath sounds: Normal breath sounds.  Musculoskeletal:     Right lower leg: 2+ Pitting Edema present.     Left lower leg: 2+ Pitting Edema present.  Skin:    General: Skin is warm.  Neurological:     General: No focal deficit present.     Mental Status: She is alert.  Psychiatric:        Mood and Affect: Mood normal.        Behavior: Behavior normal.         Assessment And Plan:     1. Obesity, diabetes, and hypertension syndrome ([place])  I will check DM labs as listed below. Importance of dietary and exercise compliance was discussed with the patient.  She is encouraged to start with 10 minutes of exercise three days per week. Her bp is well controlled. She will continue with current meds.   - BMP8+EGFR - Hemoglobin A1c  2. Pure hypercholesterolemia  Chronic. Previous labs were reviewed in full detail. She will continue with current meds. She is encouraged to avoid fried foods and to increase daily activity.   3. Bilateral leg edema  Chronic. She is encouraged to elevate her feet while seated. Importance of salt restriction and dietary compliance was again discussed with the patient. She is also encouraged to wear compression hose when she knows she is going to be on her feet for a long period of time.   4. Cough  Chronic. She requests COVID antibody testing.   - SAR CoV2 Serology (COVID 19)AB(IGG)IA  5. Drug therapy   - Vitamin B12        Maximino Greenland, MD    THE PATIENT IS ENCOURAGED TO PRACTICE SOCIAL DISTANCING DUE TO THE COVID-19 PANDEMIC.

## 2018-08-04 DIAGNOSIS — G4730 Sleep apnea, unspecified: Secondary | ICD-10-CM | POA: Diagnosis not present

## 2018-08-04 DIAGNOSIS — Z7982 Long term (current) use of aspirin: Secondary | ICD-10-CM | POA: Diagnosis not present

## 2018-08-04 DIAGNOSIS — S04011A Injury of optic nerve, right eye, initial encounter: Secondary | ICD-10-CM | POA: Diagnosis not present

## 2018-08-04 DIAGNOSIS — H4089 Other specified glaucoma: Secondary | ICD-10-CM | POA: Diagnosis not present

## 2018-08-04 DIAGNOSIS — Z9989 Dependence on other enabling machines and devices: Secondary | ICD-10-CM | POA: Diagnosis not present

## 2018-08-04 DIAGNOSIS — R930 Abnormal findings on diagnostic imaging of skull and head, not elsewhere classified: Secondary | ICD-10-CM | POA: Diagnosis not present

## 2018-08-05 DIAGNOSIS — S04011A Injury of optic nerve, right eye, initial encounter: Secondary | ICD-10-CM | POA: Diagnosis not present

## 2018-08-09 ENCOUNTER — Telehealth: Payer: Self-pay

## 2018-08-09 NOTE — Telephone Encounter (Signed)
I called the pt to schedule her for a virtual appt so that she can be tested for the coronavirus.  The pt said that she has an appt at [facility] for tomorrow.  The pt was told that she needed to self isolate until she has been cleared.

## 2018-08-10 DIAGNOSIS — Z20828 Contact with and (suspected) exposure to other viral communicable diseases: Secondary | ICD-10-CM | POA: Diagnosis not present

## 2018-08-12 ENCOUNTER — Other Ambulatory Visit: Payer: Self-pay | Admitting: Cardiovascular Disease

## 2018-08-13 NOTE — Assessment & Plan Note (Signed)
She seems comfortable with CPAP, describing good compliance and control Plan- Continue CPAP 9 or auto 5-15

## 2018-08-13 NOTE — Assessment & Plan Note (Signed)
Suspect this may be nerve root irritation related to known degenerative disk disease, esp since cardiac cath done in March. Plan- Short term Tylenol # III, CT T-spine, may need Neurosurgery eval, epidural, etc.

## 2018-08-31 DIAGNOSIS — R32 Unspecified urinary incontinence: Secondary | ICD-10-CM | POA: Diagnosis not present

## 2018-08-31 DIAGNOSIS — R21 Rash and other nonspecific skin eruption: Secondary | ICD-10-CM | POA: Diagnosis not present

## 2018-08-31 DIAGNOSIS — N898 Other specified noninflammatory disorders of vagina: Secondary | ICD-10-CM | POA: Diagnosis not present

## 2018-09-07 ENCOUNTER — Other Ambulatory Visit: Payer: Self-pay | Admitting: Neurology

## 2018-09-07 DIAGNOSIS — S04011A Injury of optic nerve, right eye, initial encounter: Secondary | ICD-10-CM

## 2018-09-08 ENCOUNTER — Other Ambulatory Visit: Payer: Self-pay | Admitting: Internal Medicine

## 2018-09-09 ENCOUNTER — Other Ambulatory Visit: Payer: Self-pay | Admitting: Cardiovascular Disease

## 2018-09-13 ENCOUNTER — Ambulatory Visit: Payer: Medicare Other | Admitting: Cardiovascular Disease

## 2018-09-19 ENCOUNTER — Other Ambulatory Visit: Payer: Self-pay | Admitting: Cardiovascular Disease

## 2018-09-19 NOTE — Patient Instructions (Addendum)
Medication Instructions:  INCREASE: Lasix to 40 mg daily for 5 days ONLY then take 40 mg every other day   If you need a refill on your cardiac medications before your next appointment, please call your pharmacy.   Lab work: TODAY: BMET & BNP  FUTURE: BMET in 1 week   If you have labs (blood work) drawn today and your tests are completely normal, you will receive your results only by: [name] MyChart Message (if you have MyChart) OR . A paper copy in the mail If you have any lab test that is abnormal or we need to change your treatment, we will call you to review the results.  Testing/Procedures: None  Follow-Up: At [facility], you and your health needs are our priority.  As part of our continuing mission to provide you with exceptional heart care, we have created designated Provider Care Teams.  These Care Teams include your primary Cardiologist (physician) and Advanced Practice Providers (APPs -  Physician Assistants and Nurse Practitioners) who all work together to provide you with the care you need, when you need it. You will need a follow up appointment in:  3 weeks.  Please call our office 2 months in advance to schedule this appointment.  You may see Mertie Moores, MD or one of the following Advanced Practice Providers on your designated Care Team: . Daune Perch, NP  Any Other Special Instructions Will Be Listed Below (If Applicable).   Lifestyle Modifications to Prevent and Treat Heart Disease -Recommend heart healthy/Mediterranean diet, with whole grains, fruits, vegetables, fish, lean meats, nuts, olive oil and avocado oil.  -Limit salt intake to less than 1500 mg per day.  -Recommend moderate walking, starting slowly with a few minutes and working up to 3-5 times/week for 30-50 minutes each session. Aim for at least 150 minutes.week. Goal should be pace of 3 miles/hours, or walking 1.5 miles in 30 minutes -Recommend avoidance of tobacco products. Avoid excess alcohol. -Keep  blood pressure well controlled, ideally less than 130/80.    Do the following things EVERY DAY:   1. Weigh yourself EVERY morning after you go to the bathroom but before you eat or drink anything. Write this number down in a weight log/diary. If you gain 3 pounds overnight or 5 pounds in a week, call the office.   2. Take your medicines as prescribed. If you have concerns about your medications, please call us before you stop taking them.    3. Eat low salt foods-Limit salt (sodium) to 2000 mg per day. This will help prevent your body from holding onto fluid. Read food labels as many processed foods have a lot of sodium, especially canned goods and prepackaged meats. If you would like some assistance choosing low sodium foods, we would be happy to set you up with a nutritionist.   4. Stay as active as you can everyday. Staying active will give you more energy and make your muscles stronger. Start with 5 minutes at a time and work your way up to 30 minutes a day. Break up your activities--do some in the morning and some in the afternoon. Start with 3 days per week and work your way up to 5 days as you can.  If you have chest pain, feel short of breath, dizzy, or lightheaded, STOP. If you don't feel better after a short rest, call 911. If you do feel better, call the office to let us know you have symptoms with exercise.   5. Limit  all fluids for the day to less than 2 liters. Fluid includes all drinks, coffee, juice, ice chips, soup, jello, and all other liquids.    DASH Eating Plan DASH stands for "Dietary Approaches to Stop Hypertension." The DASH eating plan is a healthy eating plan that has been shown to reduce high blood pressure (hypertension). It may also reduce your risk for type 2 diabetes, heart disease, and stroke. The DASH eating plan may also help with weight loss. What are tips for following this plan?  General guidelines  Avoid eating more than 2,300 mg (milligrams) of salt  (sodium) a day. If you have hypertension, you may need to reduce your sodium intake to 1,500 mg a day.  Limit alcohol intake to no more than 1 drink a day for nonpregnant women and 2 drinks a day for men. One drink equals 12 oz of beer, 5 oz of wine, or 1 oz of hard liquor.  Work with your health care provider to maintain a healthy body weight or to lose weight. Ask what an ideal weight is for you.  Get at least 30 minutes of exercise that causes your heart to beat faster (aerobic exercise) most days of the week. Activities may include walking, swimming, or biking.  Work with your health care provider or diet and nutrition specialist (dietitian) to adjust your eating plan to your individual calorie needs. Reading food labels   Check food labels for the amount of sodium per serving. Choose foods with less than 5 percent of the Daily Value of sodium. Generally, foods with less than 300 mg of sodium per serving fit into this eating plan.  To find whole grains, look for the word "whole" as the first word in the ingredient list. Shopping  Buy products labeled as "low-sodium" or "no salt added."  Buy fresh foods. Avoid canned foods and premade or frozen meals. Cooking  Avoid adding salt when cooking. Use salt-free seasonings or herbs instead of table salt or sea salt. Check with your health care provider or pharmacist before using salt substitutes.  Do not fry foods. Cook foods using healthy methods such as baking, boiling, grilling, and broiling instead.  Cook with heart-healthy oils, such as olive, canola, soybean, or sunflower oil. Meal planning  Eat a balanced diet that includes: ? 5 or more servings of fruits and vegetables each day. At each meal, try to fill half of your plate with fruits and vegetables. ? Up to 6-8 servings of whole grains each day. ? Less than 6 oz of lean meat, poultry, or fish each day. A 3-oz serving of meat is about the same size as a deck of cards. One egg  equals 1 oz. ? 2 servings of low-fat dairy each day. ? A serving of nuts, seeds, or beans 5 times each week. ? Heart-healthy fats. Healthy fats called Omega-3 fatty acids are found in foods such as flaxseeds and coldwater fish, like sardines, salmon, and mackerel.  Limit how much you eat of the following: ? Canned or prepackaged foods. ? Food that is high in trans fat, such as fried foods. ? Food that is high in saturated fat, such as fatty meat. ? Sweets, desserts, sugary drinks, and other foods with added sugar. ? Full-fat dairy products.  Do not salt foods before eating.  Try to eat at least 2 vegetarian meals each week.  Eat more home-cooked food and less restaurant, buffet, and fast food.  When eating at a restaurant, ask that your food  be prepared with less salt or no salt, if possible. What foods are recommended? The items listed may not be a complete list. Talk with your dietitian about what dietary choices are best for you. Grains Whole-grain or whole-wheat bread. Whole-grain or whole-wheat pasta. Brown rice. [place]. Bulgur. Whole-grain and low-sodium cereals. Pita bread. Low-fat, low-sodium crackers. Whole-wheat flour tortillas. Vegetables Fresh or frozen vegetables (raw, steamed, roasted, or grilled). Low-sodium or reduced-sodium tomato and vegetable juice. Low-sodium or reduced-sodium tomato sauce and tomato paste. Low-sodium or reduced-sodium canned vegetables. Fruits All fresh, dried, or frozen fruit. Canned fruit in natural juice (without added sugar). Meat and other protein foods Skinless chicken or [place]. Ground chicken or [place]. Pork with fat trimmed off. Fish and seafood. Egg whites. Dried beans, peas, or lentils. Unsalted nuts, nut butters, and seeds. Unsalted canned beans. Lean cuts of beef with fat trimmed off. Low-sodium, lean deli meat. Dairy Low-fat (1%) or fat-free (skim) milk. Fat-free, low-fat, or reduced-fat cheeses. Nonfat, low-sodium ricotta or  cottage cheese. Low-fat or nonfat yogurt. Low-fat, low-sodium cheese. Fats and oils Soft margarine without trans fats. Vegetable oil. Low-fat, reduced-fat, or light mayonnaise and salad dressings (reduced-sodium). Canola, safflower, olive, soybean, and sunflower oils. Avocado. Seasoning and other foods Herbs. Spices. Seasoning mixes without salt. Unsalted popcorn and pretzels. Fat-free sweets. What foods are not recommended? The items listed may not be a complete list. Talk with your dietitian about what dietary choices are best for you. Grains Baked goods made with fat, such as croissants, muffins, or some breads. Dry pasta or rice meal packs. Vegetables Creamed or fried vegetables. Vegetables in a cheese sauce. Regular canned vegetables (not low-sodium or reduced-sodium). Regular canned tomato sauce and paste (not low-sodium or reduced-sodium). Regular tomato and vegetable juice (not low-sodium or reduced-sodium). Angie Fava. Olives. Fruits Canned fruit in a light or heavy syrup. Fried fruit. Fruit in cream or butter sauce. Meat and other protein foods Fatty cuts of meat. Ribs. Fried meat. Berniece Salines. Sausage. Bologna and other processed lunch meats. Salami. Fatback. Hotdogs. Bratwurst. Salted nuts and seeds. Canned beans with added salt. Canned or smoked fish. Whole eggs or egg yolks. Chicken or [place] with skin. Dairy Whole or 2% milk, cream, and half-and-half. Whole or full-fat cream cheese. Whole-fat or sweetened yogurt. Full-fat cheese. Nondairy creamers. Whipped toppings. Processed cheese and cheese spreads. Fats and oils Butter. Stick margarine. Lard. Shortening. Ghee. Bacon fat. Tropical oils, such as coconut, palm kernel, or palm oil. Seasoning and other foods Salted popcorn and pretzels. Onion salt, garlic salt, seasoned salt, table salt, and sea salt. Worcestershire sauce. Tartar sauce. Barbecue sauce. Teriyaki sauce. Soy sauce, including reduced-sodium. Steak sauce. Canned and packaged  gravies. Fish sauce. Oyster sauce. Cocktail sauce. Horseradish that you find on the shelf. Ketchup. Mustard. Meat flavorings and tenderizers. Bouillon cubes. Hot sauce and Tabasco sauce. Premade or packaged marinades. Premade or packaged taco seasonings. Relishes. Regular salad dressings. Where to find more information:  National Heart, Lung, and [place]: https://wilson-eaton.com/  American Heart Association: www.heart.org Summary  The DASH eating plan is a healthy eating plan that has been shown to reduce high blood pressure (hypertension). It may also reduce your risk for type 2 diabetes, heart disease, and stroke.  With the DASH eating plan, you should limit salt (sodium) intake to 2,300 mg a day. If you have hypertension, you may need to reduce your sodium intake to 1,500 mg a day.  When on the DASH eating plan, aim to eat more fresh fruits and vegetables, whole  grains, lean proteins, low-fat dairy, and heart-healthy fats.  Work with your health care provider or diet and nutrition specialist (dietitian) to adjust your eating plan to your individual calorie needs. This information is not intended to replace advice given to you by your health care provider. Make sure you discuss any questions you have with your health care provider. Document Released: 01/16/2011 Document Revised: 01/09/2017 Document Reviewed: 01/21/2016 Elsevier Patient Education  2020 [place].    Edema  Edema is when you have too much fluid in your body or under your skin. Edema may make your legs, feet, and ankles swell up. Swelling is also common in looser tissues, like around your eyes. This is a common condition. It gets more common as you get older. There are many possible causes of edema. Eating too much salt (sodium) and being on your feet or sitting for a long time can cause edema in your legs, feet, and ankles. Hot weather may make edema worse. Edema is usually painless. Your skin may look swollen or  shiny. Follow these instructions at home:  Keep the swollen body part raised (elevated) above the level of your heart when you are sitting or lying down.  Do not sit still or stand for a long time.  Do not wear tight clothes. Do not wear garters on your upper legs.  Exercise your legs. This can help the swelling go down.  Wear elastic bandages or support stockings as told by your doctor.  Eat a low-salt (low-sodium) diet to reduce fluid as told by your doctor.  Depending on the cause of your swelling, you may need to limit how much fluid you drink (fluid restriction).  Take over-the-counter and prescription medicines only as told by your doctor. Contact a doctor if:  Treatment is not working.  You have heart, liver, or kidney disease and have symptoms of edema.  You have sudden and unexplained weight gain. Get help right away if:  You have shortness of breath or chest pain.  You cannot breathe when you lie down.  You have pain, redness, or warmth in the swollen areas.  You have heart, liver, or kidney disease and get edema all of a sudden.  You have a fever and your symptoms get worse all of a sudden. Summary  Edema is when you have too much fluid in your body or under your skin.  Edema may make your legs, feet, and ankles swell up. Swelling is also common in looser tissues, like around your eyes.  Raise (elevate) the swollen body part above the level of your heart when you are sitting or lying down.  Follow your doctor's instructions about diet and how much fluid you can drink (fluid restriction). This information is not intended to replace advice given to you by your health care provider. Make sure you discuss any questions you have with your health care provider. Document Released: 07/16/2007 Document Revised: 01/30/2017 Document Reviewed: 02/15/2016 Elsevier Patient Education  2020 [employer].

## 2018-09-19 NOTE — Progress Notes (Signed)
Cardiology Office Note:    Date:  09/20/2018   ID:  Sheila Reynolds, DOB 10-10-1945, MRN [id number]  PCP:  Glendale Chard, MD  Cardiologist:  Mertie Moores, MD  Referring MD: [place] Chard, MD   Chief Complaint  Patient presents with   Follow-up    CAD   Leg Swelling   Shortness of Breath    with exertion    History of Present Illness:    Sheila Reynolds is a 73 y.o. female with a past medical history significant for CAD status post orbital arthrectomy and PCI with DES to LAD 2018, nonsustained ventricular tachycardia, palpitations, hypertension, hyperlipidemia, sarcoidosis followed by Dr. Keturah Barre.   She had a low risk Myoview in 01/2018.  Echocardiogram in 01/2017 showed normal EF 10-17%, grade 1 diastolic dysfunction, mild LVH.  She was last seen in the office on 01/29/2018 by Dr. Acie Fredrickson and was having complaints of increased shortness of breath.  A Lexiscan Myoview was done on 02/02/2018 which was normal with no ischemia or infarction, EF 68%.  She had complaints of palpitations in March and an event monitor was placed which showed NSR, frequent PVCs, occasionally in trigeminal pattern, no significant arrhythmias.  Notes indicate that the patient did not feel like the burden of her palpitations required any medication change.  Sheila Reynolds is here today alone. She is having left chest discomfort that is sharp and "tingly", new for her. Lasts only a few seconds. Unrelated to activity. Not worse with movement. Not reproducible. She has shortness of breath with minimal activities. She has to stop and rest. She has over an acre of yard and she tries to walk in her yard often. She has occ shortness of breath at night and has to elevate her head with 2 pillows. She is having lower extremity edema, slightly worse than usual. Worse at the end of the day. She is wearing compression stockings. She limits salt in her foods. She is not eating out due to the pandemic. Only has occ  palpitations, not bothering her much. She notices that when she sits a lot, her swelling gets worse.   She takes lasix 20 mg every 3 days, decreased due to leg cramping.   Past Medical History:  Diagnosis Date   Anemia    3 months ago anemic   Anxiety    on meds   Arthritis    "all over" (01/15/2017)   Asthma    Bronchitis with emphysema    Chest pain    Chronic bronchitis ([place])    Coronary artery disease    a. 01/2017 she underwent orbital atherectomy/DES to the proxmal LAD and PTCA to ostial D2. 2D Echo 01/15/17 showed mild LVH, EF 60-65%, grade 1 DD.   Family history of anesthesia complication    daughter N/V   Fibromyalgia    GERD (gastroesophageal reflux disease)    on meds   Heart murmur    History of hiatal hernia    Hx of echocardiogram    Echo (03/2013):  Tech limited; Mild focal basal septal hypertrophy, EF 60-65%, normal RVF   Hyperlipidemia    Hypertension    Nonsustained ventricular tachycardia (HCC)    OSA on CPAP    Premature atrial contractions    PVC (premature ventricular contraction)    a. Holter 12/16: NSR, occ PAC,PVCs   Sarcoidosis    Type II diabetes mellitus ([place])     Past Surgical History:  Procedure Laterality Date   BREAST SURGERY  CARDIAC CATHETERIZATION  05/04/2007   reveals overall normal left ventricular systolic function. Ejection fraction 65-70%   CATARACT EXTRACTION W/PHACO Right 07/20/2013   Procedure: CATARACT EXTRACTION PHACO AND INTRAOCULAR LENS PLACEMENT (IOC);  Surgeon: Marylynn Pearson, MD;  Location: [facility];  Service: Ophthalmology;  Laterality: Right;   COLONOSCOPY W/ BIOPSIES AND POLYPECTOMY     COLONOSCOPY WITH PROPOFOL N/A 09/07/2014   Procedure: COLONOSCOPY WITH PROPOFOL;  Surgeon: Juanita Craver, MD;  Location: [facility];  Service: Endoscopy;  Laterality: N/A;   CORONARY ANGIOPLASTY WITH STENT PLACEMENT  01/15/2017   CORONARY ATHERECTOMY N/A 01/15/2017   Procedure: CORONARY ATHERECTOMY;  Surgeon:  Nelva Bush, MD;  Location: [facility];  Service: Cardiovascular;  Laterality: N/A;   CORONARY BALLOON ANGIOPLASTY N/A 01/15/2017   Procedure: CORONARY BALLOON ANGIOPLASTY;  Surgeon: Nelva Bush, MD;  Location: [facility];  Service: Cardiovascular;  Laterality: N/A;   CORONARY STENT INTERVENTION N/A 01/15/2017   Procedure: CORONARY STENT INTERVENTION;  Surgeon: Nelva Bush, MD;  Location: [facility];  Service: Cardiovascular;  Laterality: N/A;   ESOPHAGOGASTRODUODENOSCOPY (EGD) WITH PROPOFOL N/A 09/07/2014   Procedure: ESOPHAGOGASTRODUODENOSCOPY (EGD) WITH PROPOFOL;  Surgeon: Juanita Craver, MD;  Location: [facility];  Service: Endoscopy;  Laterality: N/A;   EXTERNAL EAR SURGERY Bilateral 1970s   tumors removed   EYE SURGERY Left 2019   cataract extraction    EYE SURGERY Right 02/09/2018   eyelid surgery    INTRAVASCULAR PRESSURE WIRE/FFR STUDY N/A 12/12/2016   Procedure: INTRAVASCULAR PRESSURE WIRE/FFR STUDY;  Surgeon: Nelva Bush, MD;  Location: [facility];  Service: Cardiovascular;  Laterality: N/A;   MINI SHUNT INSERTION Right 07/20/2013   Procedure: INSERTION OF GLAUCOMA FILTRATION DEVICE RIGHT EYE;  Surgeon: Marylynn Pearson, MD;  Location: [place];  Service: Ophthalmology;  Laterality: Right;   MITOMYCIN C APPLICATION Right 1/44/8185   Procedure: MITOMYCIN C APPLICATION;  Surgeon: Marylynn Pearson, MD;  Location: [place];  Service: Ophthalmology;  Laterality: Right;   MITOMYCIN C APPLICATION Right 6/31/4970   Procedure: MITOMYCIN C APPLICATION RIGHT EYE;  Surgeon: Marylynn Pearson, MD;  Location: [place];  Service: Ophthalmology;  Laterality: Right;   PLACEMENT OF BREAST IMPLANTS Bilateral 1992   "took all my breast tissue out; put implants in;fibrocystic breast disease "   RIGHT/LEFT HEART CATH AND CORONARY ANGIOGRAPHY N/A 12/12/2016   Procedure: RIGHT/LEFT HEART CATH AND CORONARY ANGIOGRAPHY;  Surgeon: Nelva Bush, MD;  Location: [place] [facility];  Service: Cardiovascular;  Laterality: N/A;   TONSILLECTOMY     TOTAL ABDOMINAL HYSTERECTOMY  1982   TRABECULECTOMY Right 02/21/2015   Procedure: TRABECULECTOMY WITH [facility] ON THE RIGHT EYE;  Surgeon: Marylynn Pearson, MD;  Location: [place];  Service: Ophthalmology;  Laterality: Right;    Current Medications: Current Meds  Medication Sig   acetaminophen (TYLENOL) 500 MG tablet Take 1,000 mg by mouth 2 (two) times daily as needed for moderate pain or headache.   albuterol (PROVENTIL) (2.5 MG/3ML) 0.083% nebulizer solution Take 3 mLs (2.5 mg total) by nebulization daily as needed for wheezing or shortness of breath.   albuterol (VENTOLIN HFA) 108 (90 Base) MCG/ACT inhaler Inhale 2 puffs into the lungs every 6 (six) hours as needed for wheezing or shortness of breath.   aspirin EC 81 MG tablet Take 81 mg by mouth at bedtime.    atorvastatin (LIPITOR) 40 MG tablet Take 1 tablet (40 mg total) by mouth daily at 6 PM. Please keep upcoming appt for future refills. Thank you  brimonidine (ALPHAGAN P) 0.1 % SOLN Place 1 drop into the left eye 3 (three) times daily.    brinzolamide (AZOPT) 1 % ophthalmic suspension Place 1 drop into the left eye 3 (three) times daily.    carvedilol (COREG) 12.5 MG tablet Take 1 tablet (12.5 mg total) by mouth 2 (two) times daily.   clopidogrel (PLAVIX) 75 MG tablet Take 1 tablet (75 mg total) by mouth daily.   folic acid (FOLVITE) 1 MG tablet Take 2 tablets (2 mg total) by mouth daily.   guaiFENesin (ROBITUSSIN) 100 MG/5ML SOLN Take 5 mLs by mouth every 6 (six) hours as needed for cough or to loosen phlegm.    isosorbide mononitrate (IMDUR) 60 MG 24 hr tablet Take 1 tablet (60 mg total) by mouth daily. Please make annual appt in December for future refills. Thank you   JANUMET 50-500 MG tablet TAKE 1 TABLET BY MOUTH TWICE DAILY WITH A MEAL   Lancets (ONETOUCH DELICA PLUS [id number]) MISC CHECK BLOOD SUGAR BEFORE BREAKFAST AND DINNER   LORazepam  (ATIVAN) 1 MG tablet Take 1 mg by mouth once.   loteprednol (LOTEMAX) 0.5 % ophthalmic suspension See admin instructions. Instill 8 DROPS IN LEFT EYE 3 IN RIGHT EYE DAILY   montelukast (SINGULAIR) 10 MG tablet TAKE 1 TABLET BY MOUTH EVERY EVENING   Nebulizers (COMPRESSOR/NEBULIZER) MISC Use as directed   nitroGLYCERIN (NITROSTAT) 0.4 MG SL tablet Place 1 tablet (0.4 mg total) under the tongue every 5 (five) minutes as needed for chest pain.   olmesartan (BENICAR) 20 MG tablet Take 20 mg by mouth daily.   OVER THE COUNTER MEDICATION    pantoprazole (PROTONIX) 40 MG tablet TAKE 1 TABLET(40 MG) BY MOUTH DAILY   Polyvinyl Alcohol-Povidone (REFRESH OP) Apply to eye daily.   pregabalin (LYRICA) 75 MG capsule TAKE ONE CAPSULE BY MOUTH THREE TIMES DAILY   traMADol (ULTRAM) 50 MG tablet Take 1 tablet (50 mg total) by mouth every 6 (six) hours as needed.   travoprost, benzalkonium, (TRAVATAN) 0.004 % ophthalmic solution Place 1 drop into the right eye at bedtime.    [DISCONTINUED] furosemide (LASIX) 20 MG tablet TAKE 1 TABLET BY MOUTH EVERY 3 DAYS     Allergies:   Crestor [rosuvastatin calcium], Demerol  [meperidine hcl], Shellfish allergy, and Demerol [meperidine]   Social History   Socioeconomic History   Marital status: Married    Spouse name: Not on file   Number of children: 2   Years of education: Not on file   Highest education level: Not on file  Occupational History   Occupation: unemployed    Fish farm manager: [employer]  Social Needs   Financial resource strain: Not hard at all   Food insecurity    Worry: Never true    Inability: Never true   Transportation needs    Medical: No    Non-medical: No  Tobacco Use   Smoking status: Former Smoker    Packs/day: 1.00    Years: 4.00    Pack years: 4.00    Types: Cigarettes    Quit date: 02/11/1980    Years since quitting: 38.6   Smokeless tobacco: Never Used  Substance and Sexual Activity   Alcohol  use: No   Drug use: No   Sexual activity: Yes  Lifestyle   Physical activity    Days per week: 0 days    Minutes per session: 0 min   Stress: Not at all  Relationships   Social connections  Talks on phone: Not on file    Gets together: Not on file    Attends religious service: Not on file    Active member of club or organization: Not on file    Attends meetings of clubs or organizations: Not on file    Relationship status: Not on file  Other Topics Concern   Not on file  Social History Narrative   Not on file     Family History: The patient's family history includes Cancer in her mother and paternal grandmother; Diabetes in her brother and father; Glaucoma in her brother and father; Heart disease in her father and paternal grandmother. ROS:   Please see the history of present illness.     All other systems reviewed and are negative.  EKGs/Labs/Other Studies Reviewed:    The following studies were reviewed today:  Cardiac event monitor 04/12/2018 Study Highlights  NSR  Frequent PVCs - occasionally in a trigeminal pattern  No significant arrhythmias     Lexiscan Myoview 02/02/2018 Study Highlights   The left ventricular ejection fraction is hyperdynamic (>65%).  Nuclear stress EF: 68%.  Blood pressure demonstrated a normal response to exercise.  There was no ST segment deviation noted during stress.  The study is normal.  This is a low risk study.   Normal resting and stress perfusion. No ischemia or infarction EF 68%   Echocardiogram 01/15/2017 Study Conclusions  - Left ventricle: The cavity size was normal. Wall thickness was   increased in a pattern of mild LVH. Systolic function was normal.   The estimated ejection fraction was in the range of 60% to 65%.   Wall motion was normal; there were no regional wall motion   abnormalities. Doppler parameters are consistent with abnormal   left ventricular relaxation (grade 1 diastolic  dysfunction). - Aortic valve: Mildly calcified annulus. Trileaflet; mildly   thickened leaflets. - Right ventricle: The cavity size was normal. Systolic function   was normal. - Pericardium, extracardiac: A trivial pericardial effusion was   identified posterior to the heart.  Left heart cath 01/15/2017 Conclusions: 1. 70% proximal/mid LAD stenosis (previously found to be hemodynamically significant with resting Pd/Pa of 0.79). 2. 70% ostial D2 stenosis. 3. Successful orbital atherectomy and PCI using Xience Sierra 3.5 x 33 mm DES with 10% proximal stenosis (post-dilated with 4.0 [place] balloon at high pressure) and TIMI-3 flow. 4. Successful PTCA to ostial D2 (including kissing balloon inflation) with reduction in stenosis to 50% and TIMI-3 flow.  Recommendations: 1. Dual antiplatelet therapy with aspirin and clopidogrel for at least 6 months, ideally longer. 2. Aggressive secondary prevention. 3. Overnight observation and cardiac rehab after discharge.  EKG:  EKG is not ordered today.    Recent Labs: 03/16/2018: ALT 17; Hemoglobin 11.5; Platelets 214 07/20/2018: BUN 13; Creatinine, Ser 0.73; Potassium 4.2; Sodium 141 07/21/2018: BNP 51.1   Recent Lipid Panel    Component Value Date/Time   CHOL 166 03/18/2018 1303   TRIG 114 03/18/2018 1303   HDL 43 03/18/2018 1303   CHOLHDL 3.9 03/18/2018 1303   CHOLHDL 3 05/24/2014 1650   VLDL 20.8 05/24/2014 1650   LDLCALC 100 (H) 03/18/2018 1303    Physical Exam:    VS:  BP 118/72    Pulse 75    Ht 5\' 1"  (1.549 m)    Wt 173 lb 6.4 oz (78.7 kg)    SpO2 98%    BMI 32.76 kg/m     Wt Readings from Last 3 Encounters:  09/20/18 173 lb 6.4 oz (78.7 kg)  07/20/18 171 lb 6.4 oz (77.7 kg)  06/28/18 169 lb 9.6 oz (76.9 kg)     Physical Exam  Constitutional: She is oriented to person, place, and time. She appears well-developed and well-nourished. No distress.  HENT:  Head: Normocephalic and atraumatic.  Neck: Normal range of motion. Neck  supple. No JVD present.  Cardiovascular: Normal rate, regular rhythm and intact distal pulses. Exam reveals gallop. Exam reveals no friction rub.  No murmur heard. Pulmonary/Chest: Effort normal and breath sounds normal. No respiratory distress. She has no wheezes. She has no rales.  Abdominal: Soft. Bowel sounds are normal.  Musculoskeletal: Normal range of motion.        General: Edema present.     Comments: 1-2+ bilateral lower leg edema, compression stockings in place.   Neurological: She is alert and oriented to person, place, and time.  Skin: Skin is warm and dry.  Psychiatric: She has a normal mood and affect. Her behavior is normal. Judgment and thought content normal.  Vitals reviewed.    ASSESSMENT:    1. Coronary artery disease of native artery of native heart with stable angina pectoris ([place])   2. Essential (primary) hypertension   3. Hyperlipidemia, unspecified hyperlipidemia type   4. Heart palpitations   5. PVC's (premature ventricular contractions)   6. Acute on chronic diastolic heart failure (HCC)    PLAN:    In order of problems listed above:  CAD -Status post DES to LAD in 2018. -Low risk Myoview in 01/2018 -Medical therapy includes aspirin, Plavix, high intensity statin, long-acting nitrate. -Pt with some occasional sharp/tingly left chest pains that are atypical, not associated with activity and lasting only a few seconds. No exertional substernal chest pain.   Acute on chronic diastolic heart failure -Pt with lower extermity edema, mild orthopnea, worse DOE. Gallop noted, lungs clear.   -Will check BNP, BMet.  -She has been taking lasix 20 mg every 3 days due to causing leg cramps. She agrees to take 40 mg daily for 5 days and the 40 mg every other day and see if she tolerates that.   Leg swelling  -Probably a combination of diastolic heart failure and venous insufficiency.  -Continue compression stockings, elevate feet when possible. Limit sodium  intake.  -Will increase lasix.   Hypertension -Carvedilol 12.5 mg, Lasix 20 mg every 3 days, Imdur, olmesartan 20 mg -BP well controlled  Hyperlipidemia -On atorvastatin 40 mg -LDL 100 on lipid panel in 03/2018, had been 68 prior year  Palpitations with history of NSVT and PVCs -Holter monitor in March showed frequent PVCs -Currently not bothered much by palpitations.  Sarcoidosis -Followed by Dr. Keturah Barre  Diabetes type 2 -A1c 6.1, well controlled. -Pt mentions wanting to lose weight. She is eating 4 pieces of toast every morning. Also commercial cereal with berries, nuts and bananas. We discussed how she can reduce her carb intake for diabetes and wt loss.   Medication Adjustments/Labs and Tests Ordered: Current medicines are reviewed at length with the patient today.  Concerns regarding medicines are outlined above. Labs and tests ordered and medication changes are outlined in the patient instructions below:  Patient Instructions  Medication Instructions:  INCREASE: Lasix to 40 mg daily for 5 days ONLY then take 40 mg every other day   If you need a refill on your cardiac medications before your next appointment, please call your pharmacy.   Lab work: TODAY: BMET & BNP  FUTURE: BMET in 1 week   If you have labs (blood work) drawn today and your tests are completely normal, you will receive your results only by:  [place] (if you have MyChart) OR  A paper copy in the mail If you have any lab test that is abnormal or we need to change your treatment, we will call you to review the results.  Testing/Procedures: None  Follow-Up: At [facility], you and your health needs are our priority.  As part of our continuing mission to provide you with exceptional heart care, we have created designated Provider Care Teams.  These Care Teams include your primary Cardiologist (physician) and Advanced Practice Providers (APPs -  Physician Assistants and Nurse Practitioners)  who all work together to provide you with the care you need, when you need it. You will need a follow up appointment in:  3 weeks.  Please call our office 2 months in advance to schedule this appointment.  You may see Mertie Moores, MD or one of the following Advanced Practice Providers on your designated Care Team:  Daune Perch, NP  Any Other Special Instructions Will Be Listed Below (If Applicable).   Lifestyle Modifications to Prevent and Treat Heart Disease -Recommend heart healthy/Mediterranean diet, with whole grains, fruits, vegetables, fish, lean meats, nuts, olive oil and avocado oil.  -Limit salt intake to less than 1500 mg per day.  -Recommend moderate walking, starting slowly with a few minutes and working up to 3-5 times/week for 30-50 minutes each session. Aim for at least 150 minutes.week. Goal should be pace of 3 miles/hours, or walking 1.5 miles in 30 minutes -Recommend avoidance of tobacco products. Avoid excess alcohol. -Keep blood pressure well controlled, ideally less than 130/80.    Do the following things EVERY DAY:   1. Weigh yourself EVERY morning after you go to the bathroom but before you eat or drink anything. Write this number down in a weight log/diary. If you gain 3 pounds overnight or 5 pounds in a week, call the office.   2. Take your medicines as prescribed. If you have concerns about your medications, please call us before you stop taking them.    3. Eat low salt foods--Limit salt (sodium) to 2000 mg per day. This will help prevent your body from holding onto fluid. Read food labels as many processed foods have a lot of sodium, especially canned goods and prepackaged meats. If you would like some assistance choosing low sodium foods, we would be happy to set you up with a nutritionist.   4. Stay as active as you can everyday. Staying active will give you more energy and make your muscles stronger. Start with 5 minutes at a time and work your way up to 30  minutes a day. Break up your activities--do some in the morning and some in the afternoon. Start with 3 days per week and work your way up to 5 days as you can.  If you have chest pain, feel short of breath, dizzy, or lightheaded, STOP. If you don't feel better after a short rest, call 911. If you do feel better, call the office to let us know you have symptoms with exercise.   5. Limit all fluids for the day to less than 2 liters. Fluid includes all drinks, coffee, juice, ice chips, soup, jello, and all other liquids.    DASH Eating Plan DASH stands for "Dietary Approaches to Stop Hypertension." The DASH eating plan is a healthy eating plan  that has been shown to reduce high blood pressure (hypertension). It may also reduce your risk for type 2 diabetes, heart disease, and stroke. The DASH eating plan may also help with weight loss. What are tips for following this plan?  General guidelines  Avoid eating more than 2,300 mg (milligrams) of salt (sodium) a day. If you have hypertension, you may need to reduce your sodium intake to 1,500 mg a day.  Limit alcohol intake to no more than 1 drink a day for nonpregnant women and 2 drinks a day for men. One drink equals 12 oz of beer, 5 oz of wine, or 1 oz of hard liquor.  Work with your health care provider to maintain a healthy body weight or to lose weight. Ask what an ideal weight is for you.  Get at least 30 minutes of exercise that causes your heart to beat faster (aerobic exercise) most days of the week. Activities may include walking, swimming, or biking.  Work with your health care provider or diet and nutrition specialist (dietitian) to adjust your eating plan to your individual calorie needs. Reading food labels   Check food labels for the amount of sodium per serving. Choose foods with less than 5 percent of the Daily Value of sodium. Generally, foods with less than 300 mg of sodium per serving fit into this eating plan.  To find whole  grains, look for the word "whole" as the first word in the ingredient list. Shopping  Buy products labeled as "low-sodium" or "no salt added."  Buy fresh foods. Avoid canned foods and premade or frozen meals. Cooking  Avoid adding salt when cooking. Use salt-free seasonings or herbs instead of table salt or sea salt. Check with your health care provider or pharmacist before using salt substitutes.  Do not fry foods. Cook foods using healthy methods such as baking, boiling, grilling, and broiling instead.  Cook with heart-healthy oils, such as olive, canola, soybean, or sunflower oil. Meal planning  Eat a balanced diet that includes: ? 5 or more servings of fruits and vegetables each day. At each meal, try to fill half of your plate with fruits and vegetables. ? Up to 6-8 servings of whole grains each day. ? Less than 6 oz of lean meat, poultry, or fish each day. A 3-oz serving of meat is about the same size as a deck of cards. One egg equals 1 oz. ? 2 servings of low-fat dairy each day. ? A serving of nuts, seeds, or beans 5 times each week. ? Heart-healthy fats. Healthy fats called Omega-3 fatty acids are found in foods such as flaxseeds and coldwater fish, like sardines, salmon, and mackerel.  Limit how much you eat of the following: ? Canned or prepackaged foods. ? Food that is high in trans fat, such as fried foods. ? Food that is high in saturated fat, such as fatty meat. ? Sweets, desserts, sugary drinks, and other foods with added sugar. ? Full-fat dairy products.  Do not salt foods before eating.  Try to eat at least 2 vegetarian meals each week.  Eat more home-cooked food and less restaurant, buffet, and fast food.  When eating at a restaurant, ask that your food be prepared with less salt or no salt, if possible. What foods are recommended? The items listed may not be a complete list. Talk with your dietitian about what dietary choices are best for  you. Grains Whole-grain or whole-wheat bread. Whole-grain or whole-wheat pasta. Brown rice. Oatmeal.  Quinoa. Bulgur. Whole-grain and low-sodium cereals. Pita bread. Low-fat, low-sodium crackers. Whole-wheat flour tortillas. Vegetables Fresh or frozen vegetables (raw, steamed, roasted, or grilled). Low-sodium or reduced-sodium tomato and vegetable juice. Low-sodium or reduced-sodium tomato sauce and tomato paste. Low-sodium or reduced-sodium canned vegetables. Fruits All fresh, dried, or frozen fruit. Canned fruit in natural juice (without added sugar). Meat and other protein foods Skinless chicken or [place]. Ground chicken or [place]. Pork with fat trimmed off. Fish and seafood. Egg whites. Dried beans, peas, or lentils. Unsalted nuts, nut butters, and seeds. Unsalted canned beans. Lean cuts of beef with fat trimmed off. Low-sodium, lean deli meat. Dairy Low-fat (1%) or fat-free (skim) milk. Fat-free, low-fat, or reduced-fat cheeses. Nonfat, low-sodium ricotta or cottage cheese. Low-fat or nonfat yogurt. Low-fat, low-sodium cheese. Fats and oils Soft margarine without trans fats. Vegetable oil. Low-fat, reduced-fat, or light mayonnaise and salad dressings (reduced-sodium). Canola, safflower, olive, soybean, and sunflower oils. Avocado. Seasoning and other foods Herbs. Spices. Seasoning mixes without salt. Unsalted popcorn and pretzels. Fat-free sweets. What foods are not recommended? The items listed may not be a complete list. Talk with your dietitian about what dietary choices are best for you. Grains Baked goods made with fat, such as croissants, muffins, or some breads. Dry pasta or rice meal packs. Vegetables Creamed or fried vegetables. Vegetables in a cheese sauce. Regular canned vegetables (not low-sodium or reduced-sodium). Regular canned tomato sauce and paste (not low-sodium or reduced-sodium). Regular tomato and vegetable juice (not low-sodium or reduced-sodium). Angie Fava.  Olives. Fruits Canned fruit in a light or heavy syrup. Fried fruit. Fruit in cream or butter sauce. Meat and other protein foods Fatty cuts of meat. Ribs. Fried meat. Berniece Salines. Sausage. Bologna and other processed lunch meats. Salami. Fatback. Hotdogs. Bratwurst. Salted nuts and seeds. Canned beans with added salt. Canned or smoked fish. Whole eggs or egg yolks. Chicken or [place] with skin. Dairy Whole or 2% milk, cream, and half-and-half. Whole or full-fat cream cheese. Whole-fat or sweetened yogurt. Full-fat cheese. Nondairy creamers. Whipped toppings. Processed cheese and cheese spreads. Fats and oils Butter. Stick margarine. Lard. Shortening. Ghee. Bacon fat. Tropical oils, such as coconut, palm kernel, or palm oil. Seasoning and other foods Salted popcorn and pretzels. Onion salt, garlic salt, seasoned salt, table salt, and sea salt. Worcestershire sauce. Tartar sauce. Barbecue sauce. Teriyaki sauce. Soy sauce, including reduced-sodium. Steak sauce. Canned and packaged gravies. Fish sauce. Oyster sauce. Cocktail sauce. Horseradish that you find on the shelf. Ketchup. Mustard. Meat flavorings and tenderizers. Bouillon cubes. Hot sauce and Tabasco sauce. Premade or packaged marinades. Premade or packaged taco seasonings. Relishes. Regular salad dressings. Where to find more information:  National Heart, Lung, and [place]: https://wilson-eaton.com/  American Heart Association: www.heart.org Summary  The DASH eating plan is a healthy eating plan that has been shown to reduce high blood pressure (hypertension). It may also reduce your risk for type 2 diabetes, heart disease, and stroke.  With the DASH eating plan, you should limit salt (sodium) intake to 2,300 mg a day. If you have hypertension, you may need to reduce your sodium intake to 1,500 mg a day.  When on the DASH eating plan, aim to eat more fresh fruits and vegetables, whole grains, lean proteins, low-fat dairy, and heart-healthy  fats.  Work with your health care provider or diet and nutrition specialist (dietitian) to adjust your eating plan to your individual calorie needs. This information is not intended to replace advice given to you by your health care provider.  Make sure you discuss any questions you have with your health care provider. Document Released: 01/16/2011 Document Revised: 01/09/2017 Document Reviewed: 01/21/2016 Elsevier Patient Education  2020 [place].    Edema  Edema is when you have too much fluid in your body or under your skin. Edema may make your legs, feet, and ankles swell up. Swelling is also common in looser tissues, like around your eyes. This is a common condition. It gets more common as you get older. There are many possible causes of edema. Eating too much salt (sodium) and being on your feet or sitting for a long time can cause edema in your legs, feet, and ankles. Hot weather may make edema worse. Edema is usually painless. Your skin may look swollen or shiny. Follow these instructions at home:  Keep the swollen body part raised (elevated) above the level of your heart when you are sitting or lying down.  Do not sit still or stand for a long time.  Do not wear tight clothes. Do not wear garters on your upper legs.  Exercise your legs. This can help the swelling go down.  Wear elastic bandages or support stockings as told by your doctor.  Eat a low-salt (low-sodium) diet to reduce fluid as told by your doctor.  Depending on the cause of your swelling, you may need to limit how much fluid you drink (fluid restriction).  Take over-the-counter and prescription medicines only as told by your doctor. Contact a doctor if:  Treatment is not working.  You have heart, liver, or kidney disease and have symptoms of edema.  You have sudden and unexplained weight gain. Get help right away if:  You have shortness of breath or chest pain.  You cannot breathe when you lie  down.  You have pain, redness, or warmth in the swollen areas.  You have heart, liver, or kidney disease and get edema all of a sudden.  You have a fever and your symptoms get worse all of a sudden. Summary  Edema is when you have too much fluid in your body or under your skin.  Edema may make your legs, feet, and ankles swell up. Swelling is also common in looser tissues, like around your eyes.  Raise (elevate) the swollen body part above the level of your heart when you are sitting or lying down.  Follow your doctor's instructions about diet and how much fluid you can drink (fluid restriction). This information is not intended to replace advice given to you by your health care provider. Make sure you discuss any questions you have with your health care provider. Document Released: 07/16/2007 Document Revised: 01/30/2017 Document Reviewed: 02/15/2016 Elsevier Patient Education  2020 [place], Daune Perch, NP  09/20/2018 5:10 PM    [place]

## 2018-09-20 ENCOUNTER — Ambulatory Visit (INDEPENDENT_AMBULATORY_CARE_PROVIDER_SITE_OTHER): Payer: Medicare Other | Admitting: Cardiology

## 2018-09-20 ENCOUNTER — Other Ambulatory Visit: Payer: Self-pay

## 2018-09-20 ENCOUNTER — Encounter: Payer: Self-pay | Admitting: Cardiology

## 2018-09-20 VITALS — BP 118/72 | HR 75 | Ht 61.0 in | Wt 173.4 lb

## 2018-09-20 DIAGNOSIS — I25118 Atherosclerotic heart disease of native coronary artery with other forms of angina pectoris: Secondary | ICD-10-CM | POA: Diagnosis not present

## 2018-09-20 DIAGNOSIS — R002 Palpitations: Secondary | ICD-10-CM

## 2018-09-20 DIAGNOSIS — E785 Hyperlipidemia, unspecified: Secondary | ICD-10-CM

## 2018-09-20 DIAGNOSIS — I25119 Atherosclerotic heart disease of native coronary artery with unspecified angina pectoris: Secondary | ICD-10-CM

## 2018-09-20 DIAGNOSIS — I5033 Acute on chronic diastolic (congestive) heart failure: Secondary | ICD-10-CM | POA: Diagnosis not present

## 2018-09-20 DIAGNOSIS — I10 Essential (primary) hypertension: Secondary | ICD-10-CM

## 2018-09-20 DIAGNOSIS — I493 Ventricular premature depolarization: Secondary | ICD-10-CM | POA: Diagnosis not present

## 2018-09-20 MED ORDER — FUROSEMIDE 40 MG PO TABS: 40.0000 mg | ORAL_TABLET | Freq: Every day | ORAL | 3 refills | Status: DC

## 2018-09-21 LAB — BASIC METABOLIC PANEL
BUN/Creatinine Ratio: 12 (ref 12–28)
BUN: 10 mg/dL (ref 8–27)
CO2: 24 mmol/L (ref 20–29)
Calcium: 9.4 mg/dL (ref 8.7–10.3)
Chloride: 103 mmol/L (ref 96–106)
Creatinine, Ser: 0.82 mg/dL (ref 0.57–1.00)
GFR calc Af Amer: 83 mL/min/{1.73_m2} (ref >59)
GFR calc non Af Amer: 72 mL/min/{1.73_m2} (ref >59)
Glucose: 92 mg/dL (ref 65–99)
Potassium: 4.7 mmol/L (ref 3.5–5.2)
Sodium: 142 mmol/L (ref 134–144)

## 2018-09-21 LAB — PRO B NATRIURETIC PEPTIDE: NT-Pro BNP: 126 pg/mL (ref 0–301)

## 2018-09-22 ENCOUNTER — Other Ambulatory Visit: Payer: Self-pay

## 2018-09-22 MED ORDER — FUROSEMIDE 40 MG PO TABS: ORAL_TABLET | ORAL | 3 refills | Status: DC

## 2018-09-22 NOTE — Progress Notes (Signed)
New Rx sent in for Lasix prompted by a refill request from the Pharmacy for an old RX... new dose change sent in from  09/20/18 OV note.

## 2018-09-24 DIAGNOSIS — H3581 Retinal edema: Secondary | ICD-10-CM | POA: Diagnosis not present

## 2018-09-24 DIAGNOSIS — H35373 Puckering of macula, bilateral: Secondary | ICD-10-CM | POA: Diagnosis not present

## 2018-09-24 DIAGNOSIS — H20013 Primary iridocyclitis, bilateral: Secondary | ICD-10-CM | POA: Diagnosis not present

## 2018-09-27 ENCOUNTER — Other Ambulatory Visit: Payer: Medicare Other

## 2018-10-04 ENCOUNTER — Other Ambulatory Visit: Payer: Medicare Other | Admitting: *Deleted

## 2018-10-04 ENCOUNTER — Other Ambulatory Visit: Payer: Self-pay

## 2018-10-04 DIAGNOSIS — I10 Essential (primary) hypertension: Secondary | ICD-10-CM | POA: Diagnosis not present

## 2018-10-04 LAB — BASIC METABOLIC PANEL
BUN/Creatinine Ratio: 19 (ref 12–28)
BUN: 16 mg/dL (ref 8–27)
CO2: 23 mmol/L (ref 20–29)
Calcium: 9.3 mg/dL (ref 8.7–10.3)
Chloride: 99 mmol/L (ref 96–106)
Creatinine, Ser: 0.83 mg/dL (ref 0.57–1.00)
GFR calc Af Amer: 82 mL/min/{1.73_m2} (ref >59)
GFR calc non Af Amer: 71 mL/min/{1.73_m2} (ref >59)
Glucose: 143 mg/dL — ABNORMAL HIGH (ref 65–99)
Potassium: 3.6 mmol/L (ref 3.5–5.2)
Sodium: 139 mmol/L (ref 134–144)

## 2018-10-05 ENCOUNTER — Ambulatory Visit
Admission: RE | Admit: 2018-10-05 | Discharge: 2018-10-05 | Disposition: A | Discharge status: Home / Self Care | Payer: Medicare Other | Source: Ambulatory Visit | Attending: Neurology | Admitting: Neurology

## 2018-10-05 DIAGNOSIS — R51 Headache: Secondary | ICD-10-CM | POA: Diagnosis not present

## 2018-10-05 DIAGNOSIS — H53131 Sudden visual loss, right eye: Secondary | ICD-10-CM | POA: Diagnosis not present

## 2018-10-05 DIAGNOSIS — S04011A Injury of optic nerve, right eye, initial encounter: Secondary | ICD-10-CM

## 2018-10-05 DIAGNOSIS — I639 Cerebral infarction, unspecified: Secondary | ICD-10-CM | POA: Diagnosis not present

## 2018-10-05 MED ORDER — GADOBENATE DIMEGLUMINE 529 MG/ML IV SOLN
15.0000 mL | Freq: Once | INTRAVENOUS | Status: AC | PRN
  Administered 2018-10-05: 15 mL via INTRAVENOUS

## 2018-10-06 ENCOUNTER — Telehealth: Payer: Self-pay

## 2018-10-06 DIAGNOSIS — D869 Sarcoidosis, unspecified: Secondary | ICD-10-CM | POA: Diagnosis not present

## 2018-10-06 DIAGNOSIS — H547 Unspecified visual loss: Secondary | ICD-10-CM | POA: Diagnosis not present

## 2018-10-06 DIAGNOSIS — R7982 Elevated C-reactive protein (CRP): Secondary | ICD-10-CM | POA: Diagnosis not present

## 2018-10-06 DIAGNOSIS — Z8669 Personal history of other diseases of the nervous system and sense organs: Secondary | ICD-10-CM | POA: Diagnosis not present

## 2018-10-06 DIAGNOSIS — H401133 Primary open-angle glaucoma, bilateral, severe stage: Secondary | ICD-10-CM | POA: Diagnosis not present

## 2018-10-06 DIAGNOSIS — Z7982 Long term (current) use of aspirin: Secondary | ICD-10-CM | POA: Diagnosis not present

## 2018-10-06 DIAGNOSIS — G4730 Sleep apnea, unspecified: Secondary | ICD-10-CM | POA: Diagnosis not present

## 2018-10-06 DIAGNOSIS — H468 Other optic neuritis: Secondary | ICD-10-CM | POA: Diagnosis not present

## 2018-10-06 DIAGNOSIS — I639 Cerebral infarction, unspecified: Secondary | ICD-10-CM | POA: Diagnosis not present

## 2018-10-06 DIAGNOSIS — Z9989 Dependence on other enabling machines and devices: Secondary | ICD-10-CM | POA: Diagnosis not present

## 2018-10-07 ENCOUNTER — Ambulatory Visit (INDEPENDENT_AMBULATORY_CARE_PROVIDER_SITE_OTHER): Payer: Medicare Other | Admitting: Internal Medicine

## 2018-10-07 ENCOUNTER — Other Ambulatory Visit: Payer: Self-pay

## 2018-10-07 ENCOUNTER — Encounter: Payer: Self-pay | Admitting: Internal Medicine

## 2018-10-07 VITALS — BP 118/66 | HR 83 | Temp 98.8°F | Wt 171.4 lb

## 2018-10-07 DIAGNOSIS — E1165 Type 2 diabetes mellitus with hyperglycemia: Secondary | ICD-10-CM

## 2018-10-07 DIAGNOSIS — Z9989 Dependence on other enabling machines and devices: Secondary | ICD-10-CM

## 2018-10-07 DIAGNOSIS — I639 Cerebral infarction, unspecified: Secondary | ICD-10-CM

## 2018-10-07 DIAGNOSIS — G4733 Obstructive sleep apnea (adult) (pediatric): Secondary | ICD-10-CM | POA: Diagnosis not present

## 2018-10-07 DIAGNOSIS — I6381 Other cerebral infarction due to occlusion or stenosis of small artery: Secondary | ICD-10-CM

## 2018-10-07 DIAGNOSIS — Z8673 Personal history of transient ischemic attack (TIA), and cerebral infarction without residual deficits: Secondary | ICD-10-CM | POA: Diagnosis not present

## 2018-10-07 DIAGNOSIS — E7800 Pure hypercholesterolemia, unspecified: Secondary | ICD-10-CM

## 2018-10-07 DIAGNOSIS — I25119 Atherosclerotic heart disease of native coronary artery with unspecified angina pectoris: Secondary | ICD-10-CM

## 2018-10-07 DIAGNOSIS — E559 Vitamin D deficiency, unspecified: Secondary | ICD-10-CM | POA: Diagnosis not present

## 2018-10-07 NOTE — Patient Instructions (Signed)
Increase atorvastatin to TWO(2) 40mg  tablets  I will refer you to Neurology for further evaluation.   We will order carotid ultrasound and 2d-echocardiogram   Ischemic Stroke  An ischemic stroke (cerebrovascular accident, or CVA) is the sudden death of brain tissue that occurs when an area of the brain does not get enough oxygen. It is a medical emergency that must be treated right away. An ischemic stroke can cause permanent loss of brain function. This can cause problems with how different parts of your body function. What are the causes? This condition is caused by a decrease of oxygen supply to an area of the brain, which may be the result of:  A small blood clot (embolus) or a buildup of plaque in the blood vessels (atherosclerosis) that blocks blood flow in the brain.  An abnormal heart rhythm (atrial fibrillation).  A blocked or damaged artery in the head or neck. Sometimes the cause of stroke is not known (cryptogenic). What increases the risk? Certain factors may make you more likely to develop this condition. Some of these factors are things that you can change, such as:  Obesity.  Smoking cigarettes.  Taking oral birth control, especially if you also use tobacco.  Physical inactivity.  Excessive alcohol use.  Use of illegal drugs, especially cocaine and methamphetamine. Other risk factors include:  High blood pressure (hypertension).  High cholesterol.  Diabetes mellitus.  Heart disease.  Being [place] American, Native American, Hispanic, or [place] Native.  Being over age 42.  Family history of stroke.  Previous history of blood clots, stroke, or transient ischemic attack (TIA).  Sickle cell disease.  Being a woman with a history of preeclampsia.  Migraine headache.  Sleep apnea.  Irregular heartbeats, such as atrial fibrillation.  Chronic inflammatory diseases, such as rheumatoid arthritis or lupus.  Blood clotting disorders (hypercoagulable  state). What are the signs or symptoms? Symptoms of this condition usually develop suddenly, or you may notice them after waking up from sleep. Symptoms may include sudden:  Weakness or numbness in your face, arm, or leg, especially on one side of your body.  Trouble walking or difficulty moving your arms or legs.  Loss of balance or coordination.  Confusion.  Slurred speech (dysarthria).  Trouble speaking, understanding speech, or both (aphasia).  Vision changes-such as double vision, blurred vision, or loss of vision-in one or both eyes.  Dizziness.  Nausea and vomiting.  Severe headache with no known cause. The headache is often described as the worst headache ever experienced. If possible, make note of the exact time that you last felt like your normal self and what time your symptoms started. Tell your health care provider. If symptoms come and go, this could be a sign of a warning stroke, or TIA. Get help right away, even if you feel better. How is this diagnosed? This condition may be diagnosed based on:  Your symptoms, your medical history, and a physical exam.  CT scan of the brain.  MRI.  CT angiogram. This test uses a computer to take X-rays of your arteries. A dye may be injected into your blood to show the inside of your blood vessels more clearly.  MRI angiogram. This is a type of MRI that is used to evaluate the blood vessels.  Cerebral angiogram. This test uses X-rays and a dye to show the blood vessels in the brain and neck. You may need to see a health care provider who specializes in stroke care. A stroke specialist can  be seen in person or through communication using telephone or television technology (telemedicine). Other tests may also be done to find the cause of the stroke, such as:  Electrocardiogram (ECG).  Continuous heart monitoring.  Echocardiogram.  Transesophageal echocardiogram (TEE).  Carotid ultrasound.  A scan of the brain  circulation.  Blood tests.  Sleep study to check for sleep apnea. How is this treated? Treatment for this condition will depend on the duration, severity, and cause of your symptoms and on the area of the brain affected. It is very important to get treatment at the first sign of stroke symptoms. Some treatments work better if they are done within 3-6 hours of the onset of stroke symptoms. These initial treatments may include:  Aspirin.  Medicines to control blood pressure.  Medicine given by injection to dissolve the blood clot (thrombolytic).  Treatments given directly to the affected artery to remove or dissolve the blood clot. Other treatment options may include:  Oxygen.  IV fluids.  Medicines to thin the blood (anticoagulants or antiplatelets).  Procedures to increase blood flow. Medicines and changes to your diet may be used to help treat and manage risk factors for stroke, such as diabetes, high cholesterol, and high blood pressure. After a stroke, you may work with physical, speech, mental health, or occupational therapists to help you recover. Follow these instructions at home: Medicines  Take over-the-counter and prescription medicines only as told by your health care provider.  If you were told to take a medicine to thin your blood, such as aspirin or an anticoagulant, take it exactly as told by your health care provider. ? Taking too much blood-thinning medicine can cause bleeding. ? If you do not take enough blood-thinning medicine, you will not have the protection that you need against another stroke and other problems.  Understand the side effects of taking anticoagulant medicine. When taking this type of medicine, make sure you: ? Hold pressure over any cuts for longer than usual. ? Tell your dentist and other health care providers that you are taking anticoagulants before you have any procedures that may cause bleeding. ? Avoid activities that may cause trauma  or injury. Eating and drinking  Follow instructions from your health care provider about diet.  Eat healthy foods.  If your ability to swallow was affected by the stroke, you may need to take steps to avoid choking, such as: ? Taking small bites when eating. ? Eating foods that are soft or pureed. Safety  Follow instructions from your health care team about physical activity.  Use a walker or cane as told by your health care provider.  Take steps to create a safe home environment in order to reduce the risk of falls. This may include: ? Having your home looked at by specialists. ? Installing grab bars in the bedroom and bathroom. ? Using safety equipment, such as raised toilets and a seat in the shower. General instructions  Do not use any tobacco products, such as cigarettes, chewing tobacco, and e-cigarettes. If you need help quitting, ask your health care provider.  Limit alcohol intake to no more than 1 drink a day for nonpregnant women and 2 drinks a day for men. One drink equals 12 oz of beer, 5 oz of wine, or 1 oz of hard liquor.  If you need help to stop using drugs or alcohol, ask your health care provider about a referral to a program or specialist.  Maintain an active and healthy lifestyle. Get regular  exercise as told by your health care provider.  Keep all follow-up visits as told by your health care provider, including visits with all specialists on your health care team. This is important. How is this prevented? Your risk of another stroke can be decreased by managing high blood pressure, high cholesterol, diabetes, heart disease, sleep apnea, and obesity. It can also be decreased by quitting smoking, limiting alcohol, and staying physically active. Your health care provider will continue to work with you on measures to prevent short-term and long-term complications of stroke. Get help right away if:   You have any symptoms of a stroke. "BE FAST" is an easy way to  remember the main warning signs of a stroke: ? B - Balance. Signs are dizziness, sudden trouble walking, or loss of balance. ? E - Eyes. Signs are trouble seeing or a sudden change in vision. ? F - Face. Signs are sudden weakness or numbness of the face, or the face or eyelid drooping on one side. ? A - Arms. Signs are weakness or numbness in an arm. This happens suddenly and usually on one side of the body. ? S - Speech. Signs are sudden trouble speaking, slurred speech, or trouble understanding what people say. ? T - Time. Time to call emergency services. Write down what time symptoms started.  You have other signs of a stroke, such as: ? A sudden, severe headache with no known cause. ? Nausea or vomiting. ? Seizure.  These symptoms may represent a serious problem that is an emergency. Do not wait to see if the symptoms will go away. Get medical help right away. Call your local emergency services (911 in the [place] Do not drive yourself to the hospital. Summary  An ischemic stroke (cerebrovascular accident, or CVA) is the sudden death of brain tissue that occurs when an area of the brain does not get enough oxygen.  Symptoms of this condition usually develop suddenly, or you may notice them after waking up from sleep.  It is very important to get treatment at the first sign of stroke symptoms. Stroke is a medical emergency that must be treated right away. This information is not intended to replace advice given to you by your health care provider. Make sure you discuss any questions you have with your health care provider. Document Released: 01/27/2005 Document Revised: 10/16/2017 Document Reviewed: 04/25/2015 Elsevier Patient Education  [place].

## 2018-10-08 DIAGNOSIS — H20013 Primary iridocyclitis, bilateral: Secondary | ICD-10-CM | POA: Diagnosis not present

## 2018-10-08 LAB — LIPID PANEL
Chol/HDL Ratio: 2.8 ratio (ref 0.0–4.4)
Cholesterol, Total: 130 mg/dL (ref 100–199)
HDL: 47 mg/dL (ref >39)
LDL Calculated: 71 mg/dL (ref 0–99)
Triglycerides: 61 mg/dL (ref 0–149)
VLDL Cholesterol Cal: 12 mg/dL (ref 5–40)

## 2018-10-08 LAB — VITAMIN D 25 HYDROXY (VIT D DEFICIENCY, FRACTURES): Vit D, 25-Hydroxy: 34.9 ng/mL (ref 30.0–100.0)

## 2018-10-08 LAB — HM DIABETES EYE EXAM

## 2018-10-10 NOTE — Progress Notes (Signed)
Subjective:     Patient ID: Sheila Reynolds , female    DOB: 1945-05-10 , 73 y.o.   MRN: [id number]   Chief Complaint  Patient presents with  . Stroke    HPI  She is here today for f/u stroke. She reports she has been experiencing visual difficulties. She reports she has steadily been losing her vision. She was then referred to Neuro-Ophthalmology for further evaluation. At the conclusion of her exam, he reportedly stated MRI was needed. MRI significant for acute thalamic infarct. She admits that two weeks ago, she felt her face "turning". She thought it was due to something like Bell's palsy, and then her symptoms resolved within 24 hours. She never sought medical attention. She denies speech difficulties, memory issues, upper and lower extremity weakness.     Past Medical History:  Diagnosis Date  . Anemia    3 months ago anemic  . Anxiety    on meds  . Arthritis    "all over" (01/15/2017)  . Asthma   . Bronchitis with emphysema   . Chest pain   . Chronic bronchitis ([place])   . Coronary artery disease    a. 01/2017 she underwent orbital atherectomy/DES to the proxmal LAD and PTCA to ostial D2. 2D Echo 01/15/17 showed mild LVH, EF 60-65%, grade 1 DD.  . Family history of anesthesia complication    daughter N/V  . Fibromyalgia   . GERD (gastroesophageal reflux disease)    on meds  . Heart murmur   . History of hiatal hernia   . Hx of echocardiogram    Echo (03/2013):  Tech limited; Mild focal basal septal hypertrophy, EF 60-65%, normal RVF  . Hyperlipidemia   . Hypertension   . Nonsustained ventricular tachycardia ([place])   . OSA on CPAP   . Premature atrial contractions   . PVC (premature ventricular contraction)    a. Holter 12/16: NSR, occ PAC,PVCs  . Sarcoidosis   . Type II diabetes mellitus (HCC)      Family History  Problem Relation Age of Onset  . Heart disease Father   . Diabetes Father   . Glaucoma Father   . Cancer Mother        unknown type, ?lung  .  Heart disease Paternal Grandmother   . Cancer Paternal Grandmother        unknown type  . Diabetes Brother   . Glaucoma Brother      Current Outpatient Medications:  .  acetaminophen (TYLENOL) 500 MG tablet, Take 1,000 mg by mouth 2 (two) times daily as needed for moderate pain or headache., Disp: , Rfl:  .  albuterol (PROVENTIL) (2.5 MG/3ML) 0.083% nebulizer solution, Take 3 mLs (2.5 mg total) by nebulization daily as needed for wheezing or shortness of breath., Disp: 75 mL, Rfl: 12 .  albuterol (VENTOLIN HFA) 108 (90 Base) MCG/ACT inhaler, Inhale 2 puffs into the lungs every 6 (six) hours as needed for wheezing or shortness of breath., Disp: 1 Inhaler, Rfl: 12 .  aspirin EC 81 MG tablet, Take 81 mg by mouth at bedtime. , Disp: , Rfl:  .  atorvastatin (LIPITOR) 40 MG tablet, Take 1 tablet (40 mg total) by mouth daily at 6 PM. Please keep upcoming appt for future refills. Thank you, Disp: 30 tablet, Rfl: 2 .  brimonidine (ALPHAGAN P) 0.1 % SOLN, Place 1 drop into the left eye 3 (three) times daily. , Disp: , Rfl:  .  brinzolamide (AZOPT) 1 % ophthalmic suspension,  Place 1 drop into the left eye 3 (three) times daily. , Disp: , Rfl:  .  carvedilol (COREG) 12.5 MG tablet, Take 1 tablet (12.5 mg total) by mouth 2 (two) times daily., Disp: 180 tablet, Rfl: 3 .  furosemide (LASIX) 40 MG tablet, Take one tablet (40mg )  Each day for 5 days starting on 09/20/18.... then one tablet EVERY OTHER day thereafter., Disp: 48 tablet, Rfl: 3 .  guaiFENesin (ROBITUSSIN) 100 MG/5ML SOLN, Take 5 mLs by mouth every 6 (six) hours as needed for cough or to loosen phlegm. , Disp: , Rfl:  .  isosorbide mononitrate (IMDUR) 60 MG 24 hr tablet, Take 1 tablet (60 mg total) by mouth daily. Please make annual appt in December for future refills. Thank you, Disp: 90 tablet, Rfl: 0 .  JANUMET 50-500 MG tablet, TAKE 1 TABLET BY MOUTH TWICE DAILY WITH A MEAL, Disp: 180 tablet, Rfl: 1 .  Lancets (ONETOUCH DELICA PLUS 123XX123)  MISC, CHECK BLOOD SUGAR BEFORE BREAKFAST AND DINNER, Disp: 100 each, Rfl: 1 .  LORazepam (ATIVAN) 1 MG tablet, Take 1 mg by mouth once., Disp: , Rfl:  .  loteprednol (LOTEMAX) 0.5 % ophthalmic suspension, See admin instructions. Instill 8 DROPS IN LEFT EYE 3 IN RIGHT EYE DAILY, Disp: , Rfl:  .  montelukast (SINGULAIR) 10 MG tablet, TAKE 1 TABLET BY MOUTH EVERY EVENING, Disp: 90 tablet, Rfl: 1 .  Nebulizers (COMPRESSOR/NEBULIZER) MISC, Use as directed, Disp: 1 each, Rfl: 0 .  nitroGLYCERIN (NITROSTAT) 0.4 MG SL tablet, Place 1 tablet (0.4 mg total) under the tongue every 5 (five) minutes as needed for chest pain., Disp: 25 tablet, Rfl: prn .  olmesartan (BENICAR) 20 MG tablet, Take 20 mg by mouth daily., Disp: , Rfl:  .  pantoprazole (PROTONIX) 40 MG tablet, TAKE 1 TABLET(40 MG) BY MOUTH DAILY, Disp: 30 tablet, Rfl: 7 .  Polyvinyl Alcohol-Povidone (REFRESH OP), Apply to eye daily., Disp: , Rfl:  .  prednisoLONE acetate (PRED FORTE) 1 % ophthalmic suspension, 4 drops in the right eye and 6 drops in the left eye daily, Disp: , Rfl:  .  pregabalin (LYRICA) 75 MG capsule, TAKE ONE CAPSULE BY MOUTH THREE TIMES DAILY, Disp: 90 capsule, Rfl: 3 .  traMADol (ULTRAM) 50 MG tablet, Take 1 tablet (50 mg total) by mouth every 6 (six) hours as needed., Disp: 40 tablet, Rfl: 0 .  travoprost, benzalkonium, (TRAVATAN) 0.004 % ophthalmic solution, Place 1 drop into the right eye at bedtime. , Disp: , Rfl:  .  folic acid (FOLVITE) 1 MG tablet, Take 2 tablets (2 mg total) by mouth daily. (Patient not taking: Reported on 10/07/2018), Disp: 180 tablet, Rfl: 3 .  OVER THE COUNTER MEDICATION, , Disp: , Rfl:    Allergies  Allergen Reactions  . Crestor [Rosuvastatin Calcium] Other (See Comments)    muscle aches  . Demerol  [Meperidine Hcl]     Other reaction(s): Hallucinations  . Shellfish Allergy Itching and Other (See Comments)    Crab, shrimp and lobster ---lips itch and tingle Tingling  Was told not to eat again  after having a allergy test. Lobster, crab and shrimp Crab, shrimp and lobster ---lips itch and tingle PATIENT STATES SHE HAD A POSITVE ALLERGY TEST   . Demerol [Meperidine] Other (See Comments)    Hallucinations      Review of Systems  Constitutional: Negative.   Eyes: Positive for visual disturbance.  Respiratory: Negative.   Cardiovascular: Negative.   Gastrointestinal: Negative.   Neurological:  Negative.   Psychiatric/Behavioral: Negative.      Today's Vitals   10/07/18 1159  BP: 118/66  Pulse: 83  Temp: 98.8 F (37.1 C)  TempSrc: Oral  Weight: 171 lb 6.4 oz (77.7 kg)  PainSc: 5   PainLoc: Head   Body mass index is 32.39 kg/m.   Objective:  Physical Exam Vitals signs and nursing note reviewed.  Constitutional:      Appearance: Normal appearance.  HENT:     Head: Normocephalic and atraumatic.  Cardiovascular:     Rate and Rhythm: Normal rate and regular rhythm.     Heart sounds: Normal heart sounds.  Pulmonary:     Effort: Pulmonary effort is normal.     Breath sounds: Normal breath sounds.  Skin:    General: Skin is warm.  Neurological:     General: No focal deficit present.     Mental Status: She is alert and oriented to person, place, and time. Mental status is at baseline.     Cranial Nerves: No cranial nerve deficit.     Sensory: No sensory deficit.     Gait: Gait normal.  Psychiatric:        Mood and Affect: Mood normal.        Behavior: Behavior normal.         Assessment And Plan:     1. Left thalamic infarction [facility])  MRI ordered by Neuro-ophthalmology was reviewed. I will refer her to Neuro for further evaluation. I will also schedule her for studies as listed below. She reports compliance with her anticoagulation. She has been taking both Plavix and Aspirin. Pt advised Neuro may change this regimen. She is encouraged to go to ER should she develop stroke-like symptoms. Importance of medication and dietary compliance was discussed with the  patient.   - US Carotid Duplex Bilateral; Future - Ambulatory referral to Neurology - Referral to Chronic Care Management Services  2. Pure hypercholesterolemia  Chronic. I will check lipid panel to confirm she is at her LDL goal. She is also followed by Cardiology.  - Lipid Profile  3. OSA on CPAP  She reports compliance with CPAP. However, after review of NO notes, she did admit to varied use in the past. She is encouraged to use DAILY for no less than five hours per night.   4. Uncontrolled type 2 diabetes mellitus with hyperglycemia (HCC)  Diabetic foot exam was performed.  She will rto in Oct 2020 for her next scheduled diabetes check.   5. Vitamin D deficiency disease  I WILL CHECK A VIT D LEVEL AND SUPPLEMENT AS NEEDED.  ALSO ENCOURAGED TO SPEND 15 MINUTES IN THE SUN DAILY.  - Vitamin D (25 hydroxy)        Maximino Greenland, MD    THE PATIENT IS ENCOURAGED TO PRACTICE SOCIAL DISTANCING DUE TO THE COVID-19 PANDEMIC.

## 2018-10-12 ENCOUNTER — Ambulatory Visit: Payer: Self-pay

## 2018-10-12 DIAGNOSIS — E1165 Type 2 diabetes mellitus with hyperglycemia: Secondary | ICD-10-CM

## 2018-10-12 DIAGNOSIS — I6381 Other cerebral infarction due to occlusion or stenosis of small artery: Secondary | ICD-10-CM

## 2018-10-12 DIAGNOSIS — I639 Cerebral infarction, unspecified: Secondary | ICD-10-CM

## 2018-10-12 DIAGNOSIS — E559 Vitamin D deficiency, unspecified: Secondary | ICD-10-CM

## 2018-10-12 NOTE — Chronic Care Management (AMB) (Signed)
  Chronic Care Management   Outreach Note  10/12/2018 Name: Sheila Reynolds MRN: [id number] DOB: 1945-05-03  Referred by: Glendale Chard, MD Reason for referral : Care Coordination   An unsuccessful telephone outreach was attempted today. The patient was referred to the case management team by for assistance with chronic care management and care coordination.   Follow Up Plan: A HIPPA compliant phone message was left for the patient providing contact information and requesting a return call.  The care management team will reach out to the patient again over the next 10 days.   Daneen Schick, BSW, CDP Social Worker, Certified Dementia Practitioner [facility] [phone]

## 2018-10-19 ENCOUNTER — Encounter: Payer: Self-pay | Admitting: Cardiology

## 2018-10-19 ENCOUNTER — Ambulatory Visit (INDEPENDENT_AMBULATORY_CARE_PROVIDER_SITE_OTHER): Payer: Medicare Other | Admitting: Cardiology

## 2018-10-19 ENCOUNTER — Other Ambulatory Visit: Payer: Self-pay

## 2018-10-19 VITALS — BP 118/70 | HR 74 | Ht 61.0 in | Wt 170.8 lb

## 2018-10-19 DIAGNOSIS — I10 Essential (primary) hypertension: Secondary | ICD-10-CM | POA: Diagnosis not present

## 2018-10-19 DIAGNOSIS — I5033 Acute on chronic diastolic (congestive) heart failure: Secondary | ICD-10-CM

## 2018-10-19 DIAGNOSIS — I639 Cerebral infarction, unspecified: Secondary | ICD-10-CM

## 2018-10-19 DIAGNOSIS — I25119 Atherosclerotic heart disease of native coronary artery with unspecified angina pectoris: Secondary | ICD-10-CM | POA: Diagnosis not present

## 2018-10-19 DIAGNOSIS — I6381 Other cerebral infarction due to occlusion or stenosis of small artery: Secondary | ICD-10-CM

## 2018-10-19 DIAGNOSIS — M7989 Other specified soft tissue disorders: Secondary | ICD-10-CM

## 2018-10-19 DIAGNOSIS — G4733 Obstructive sleep apnea (adult) (pediatric): Secondary | ICD-10-CM | POA: Diagnosis not present

## 2018-10-19 DIAGNOSIS — D869 Sarcoidosis, unspecified: Secondary | ICD-10-CM

## 2018-10-19 DIAGNOSIS — E785 Hyperlipidemia, unspecified: Secondary | ICD-10-CM

## 2018-10-19 MED ORDER — POTASSIUM CHLORIDE CRYS ER 20 MEQ PO TBCR: 20.0000 meq | EXTENDED_RELEASE_TABLET | Freq: Every day | ORAL | 3 refills | Status: DC

## 2018-10-19 MED ORDER — CLOPIDOGREL BISULFATE 75 MG PO TABS: 75.0000 mg | ORAL_TABLET | Freq: Every day | ORAL | 3 refills | Status: DC

## 2018-10-19 NOTE — Progress Notes (Signed)
Cardiology Office Note:    Date:  10/19/2018   ID:  Sheila Reynolds, DOB 11-23-1945, MRN [id number]  PCP:  Glendale Chard, MD  Cardiologist:  Mertie Moores, MD  Referring MD: Glendale Chard, MD   Chief Complaint  Patient presents with  . Congestive Heart Failure  . Leg Swelling    History of Present Illness:    Sheila Reynolds is a 73 y.o. female with a past medical history significant for CAD status post orbital arthrectomy and PCI with DES to LAD 2018, nonsustained ventricular tachycardia, palpitations, hypertension, hyperlipidemia, sarcoidosis followed by Dr. Keturah Barre.   She had a low risk Myoview in 01/2018.  Echocardiogram in 01/2017 showed normal EF 20-35%, grade 1 diastolic dysfunction, mild LVH.  She was last seen in the office on 01/29/2018 by Dr. Acie Fredrickson and was having complaints of increased shortness of breath.  A Lexiscan Myoview was done on 02/02/2018 which was normal with no ischemia or infarction, EF 68%.  She had complaints of palpitations in March and an event monitor was placed which showed NSR, frequent PVCs, occasionally in trigeminal pattern, no significant arrhythmias.  Notes indicate that the patient did not feel like the burden of her palpitations required any medication change.  I saw Ms. Earnhart on 09/20/2018 at which time she was complaining of left chest discomfort that was sharp and tingly, lasting a few seconds and unrelated to activity.  She had lower extremity edema, mild orthopnea and worse dyspnea on exertion.  Lungs were clear but a gallop was noted.  Labs including BNP and basic metabolic panel were essentially normal.  Due to her complaints I had increased her Lasix from 20 mg every other day to 40 mg every other day.  Follow-up metabolic panel on 5/97/4163 was stable.  The patient is here today for close follow-up. She says that she has been wearing compression stockings although not on today due to rushing. She says that her fluid status is  better with the lasix however, she is having a lot of cramping so she does not take it every day. K+ was 3.6 on 10/04/2018 after increasing the lasix. She is still having shortness of breath, especially with walking and going to the grocery store. She tries to walk every day. Breathing seems to be a little better when she takes lasix.  She continues to have lower leg edema but states that the right lower leg edema goes down on days when she takes Lasix and wears compression stocking, left lower leg edema improved but never fully resolves.  She sleeps with CPAP and has to elevate her head with 2 pillows.  She sees Dr. Annamaria Boots for pulmonology and has rescue inhaler and nebulizer.   She says she had an MRI a couple of weeks ago due to loss of vision in her right eye. This showed a 4 mm acute infarction in the left thalamus.  She says that somehow she is no longer taking Plavix.  Upon review of the record it appears that the Plavix was not renewed, however, it was not officially discontinued either.  Will send in renewal of her prescription for Plavix.   Past Medical History:  Diagnosis Date  . Anemia    3 months ago anemic  . Anxiety    on meds  . Arthritis    "all over" (01/15/2017)  . Asthma   . Bronchitis with emphysema   . Chest pain   . Chronic bronchitis ([place])   . Coronary artery  disease    a. 01/2017 she underwent orbital atherectomy/DES to the proxmal LAD and PTCA to ostial D2. 2D Echo 01/15/17 showed mild LVH, EF 60-65%, grade 1 DD.  . Family history of anesthesia complication    daughter N/V  . Fibromyalgia   . GERD (gastroesophageal reflux disease)    on meds  . Heart murmur   . History of hiatal hernia   . Hx of echocardiogram    Echo (03/2013):  Tech limited; Mild focal basal septal hypertrophy, EF 60-65%, normal RVF  . Hyperlipidemia   . Hypertension   . Nonsustained ventricular tachycardia ([place])   . OSA on CPAP   . Premature atrial contractions   . PVC (premature ventricular  contraction)    a. Holter 12/16: NSR, occ PAC,PVCs  . Sarcoidosis   . Type II diabetes mellitus ([place])     Past Surgical History:  Procedure Laterality Date  . BREAST SURGERY    . CARDIAC CATHETERIZATION  05/04/2007   reveals overall normal left ventricular systolic function. Ejection fraction 65-70%  . CATARACT EXTRACTION W/PHACO Right 07/20/2013   Procedure: CATARACT EXTRACTION PHACO AND INTRAOCULAR LENS PLACEMENT (IOC);  Surgeon: Marylynn Pearson, MD;  Location: [facility];  Service: Ophthalmology;  Laterality: Right;  . COLONOSCOPY W/ BIOPSIES AND POLYPECTOMY    . COLONOSCOPY WITH PROPOFOL N/A 09/07/2014   Procedure: COLONOSCOPY WITH PROPOFOL;  Surgeon: Juanita Craver, MD;  Location: [facility];  Service: Endoscopy;  Laterality: N/A;  . CORONARY ANGIOPLASTY WITH STENT PLACEMENT  01/15/2017  . CORONARY ATHERECTOMY N/A 01/15/2017   Procedure: CORONARY ATHERECTOMY;  Surgeon: Nelva Bush, MD;  Location: [facility];  Service: Cardiovascular;  Laterality: N/A;  . CORONARY BALLOON ANGIOPLASTY N/A 01/15/2017   Procedure: CORONARY BALLOON ANGIOPLASTY;  Surgeon: Nelva Bush, MD;  Location: [facility];  Service: Cardiovascular;  Laterality: N/A;  . CORONARY STENT INTERVENTION N/A 01/15/2017   Procedure: CORONARY STENT INTERVENTION;  Surgeon: Nelva Bush, MD;  Location: [facility];  Service: Cardiovascular;  Laterality: N/A;  . ESOPHAGOGASTRODUODENOSCOPY (EGD) WITH PROPOFOL N/A 09/07/2014   Procedure: ESOPHAGOGASTRODUODENOSCOPY (EGD) WITH PROPOFOL;  Surgeon: Juanita Craver, MD;  [facility]: [facility];  Service: Endoscopy;  Laterality: N/A;  . EXTERNAL EAR SURGERY Bilateral 1970s   tumors removed  . EYE SURGERY Left 2019   cataract extraction   . EYE SURGERY Right 02/09/2018   eyelid surgery   . INTRAVASCULAR PRESSURE WIRE/FFR STUDY N/A 12/12/2016   Procedure: INTRAVASCULAR PRESSURE WIRE/FFR STUDY;  Surgeon: Nelva Bush, MD;  Location: [facility];  Service:  Cardiovascular;  Laterality: N/A;  . MINI SHUNT INSERTION Right 07/20/2013   Procedure: INSERTION OF GLAUCOMA FILTRATION DEVICE RIGHT EYE;  Surgeon: Marylynn Pearson, MD;  Location: [facility];  Service: Ophthalmology;  Laterality: Right;  . MITOMYCIN C APPLICATION Right 2/42/3536   Procedure: MITOMYCIN C APPLICATION;  Surgeon: Marylynn Pearson, MD;  Location: [facility];  Service: Ophthalmology;  Laterality: Right;  . MITOMYCIN C APPLICATION Right 1/44/3154   Procedure: MITOMYCIN C APPLICATION RIGHT EYE;  Surgeon: Marylynn Pearson, MD;  Location: [facility];  Service: Ophthalmology;  Laterality: Right;  . PLACEMENT OF BREAST IMPLANTS Bilateral 1992   "took all my breast tissue out; put implants in;fibrocystic breast disease "  . RIGHT/LEFT HEART CATH AND CORONARY ANGIOGRAPHY N/A 12/12/2016   Procedure: RIGHT/LEFT HEART CATH AND CORONARY ANGIOGRAPHY;  Surgeon: Nelva Bush, MD;  Location: [facility];  Service: Cardiovascular;  Laterality: N/A;  . TONSILLECTOMY    . TOTAL ABDOMINAL  HYSTERECTOMY  1982  . TRABECULECTOMY Right 02/21/2015   Procedure: TRABECULECTOMY WITH [facility] ON THE RIGHT EYE;  Surgeon: Marylynn Pearson, MD;  Location: [place];  Service: Ophthalmology;  Laterality: Right;    Current Medications: Current Meds  Medication Sig  . acetaminophen (TYLENOL) 500 MG tablet Take 1,000 mg by mouth 2 (two) times daily as needed for moderate pain or headache.  . albuterol (PROVENTIL) (2.5 MG/3ML) 0.083% nebulizer solution Take 3 mLs (2.5 mg total) by nebulization daily as needed for wheezing or shortness of breath.  [name] albuterol (VENTOLIN HFA) 108 (90 Base) MCG/ACT inhaler Inhale 2 puffs into the lungs every 6 (six) hours as needed for wheezing or shortness of breath.  [name] aspirin EC 81 MG tablet Take 81 mg by mouth at bedtime.   [name] atorvastatin (LIPITOR) 40 MG tablet Take 40 mg by mouth 2 (two) times daily.  . brimonidine (ALPHAGAN P) 0.1 % SOLN Place 1 drop into the left eye 3 (three) times daily.   . brinzolamide  (AZOPT) 1 % ophthalmic suspension Place 1 drop into the left eye 3 (three) times daily.   . furosemide (LASIX) 40 MG tablet Take one tablet (65m)  Each day for 5 days starting on 09/20/18.... then one tablet EVERY OTHER day thereafter.  .Marland KitchenguaiFENesin (ROBITUSSIN) 100 MG/5ML SOLN Take 5 mLs by mouth every 6 (six) hours as needed for cough or to loosen phlegm.   . isosorbide mononitrate (IMDUR) 60 MG 24 hr tablet Take 1 tablet (60 mg total) by mouth daily. Please make annual appt in December for future refills. Thank you  . JANUMET 50-500 MG tablet TAKE 1 TABLET BY MOUTH TWICE DAILY WITH A MEAL  . Lancets (ONETOUCH DELICA PLUS [id number] MISC CHECK BLOOD SUGAR BEFORE BREAKFAST AND DINNER  . loteprednol (LOTEMAX) 0.5 % ophthalmic suspension See admin instructions. Instill 8 DROPS IN LEFT EYE 3 IN RIGHT EYE DAILY  . montelukast (SINGULAIR) 10 MG tablet TAKE 1 TABLET BY MOUTH EVERY EVENING  . Nebulizers (COMPRESSOR/NEBULIZER) MISC Use as directed  . nitroGLYCERIN (NITROSTAT) 0.4 MG SL tablet Place 1 tablet (0.4 mg total) under the tongue every 5 (five) minutes as needed for chest pain.  .Marland Kitchenolmesartan (BENICAR) 20 MG tablet Take 20 mg by mouth daily.  .Marland KitchenOVER THE COUNTER MEDICATION   . pantoprazole (PROTONIX) 40 MG tablet TAKE 1 TABLET(40 MG) BY MOUTH DAILY  . Polyvinyl Alcohol-Povidone (REFRESH OP) Apply to eye daily.  . prednisoLONE acetate (PRED FORTE) 1 % ophthalmic suspension 4 drops in the right eye and 6 drops in the left eye daily  . pregabalin (LYRICA) 75 MG capsule TAKE ONE CAPSULE BY MOUTH THREE TIMES DAILY  . traMADol (ULTRAM) 50 MG tablet Take 1 tablet (50 mg total) by mouth every 6 (six) hours as needed.  . travoprost, benzalkonium, (TRAVATAN) 0.004 % ophthalmic solution Place 1 drop into the right eye at bedtime.      Allergies:   Crestor [rosuvastatin calcium], Demerol  [meperidine hcl], Shellfish allergy, and Demerol [meperidine]   Social History   Socioeconomic History  . Marital  status: Married    Spouse name: Not on file  . Number of children: 2  . Years of education: Not on file  . Highest education level: Not on file  Occupational History  . 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 Financial resource strain: Not hard at all  . Food insecurity    Worry: Never true  Inability: Never true  . Transportation needs    Medical: No    Non-medical: No  Tobacco Use  . Smoking status: Former Smoker    Packs/day: 1.00    Years: 4.00    Pack years: 4.00    Types: Cigarettes    Quit date: 02/11/1980    Years since quitting: 38.7  . Smokeless tobacco: Never Used  Substance and Sexual Activity  . Alcohol use: No  . Drug use: No  . Sexual activity: Yes  Lifestyle  . Physical activity    Days per week: 0 days    Minutes per session: 0 min  . Stress: Not at all  Relationships  . Social Herbalist on phone: Not on file    Gets together: Not on file    Attends religious service: Not on file    Active member of club or organization: Not on file    Attends meetings of clubs or organizations: Not on file    Relationship status: Not on file  Other Topics Concern  . Not on file  Social History Narrative  . Not on file     Family History: The patient's family history includes Cancer in her mother and paternal grandmother; Diabetes in her brother and father; Glaucoma in her brother and father; Heart disease in her father and paternal grandmother. ROS:   Please see the history of present illness.     All other systems reviewed and are negative.  EKGs/Labs/Other Studies Reviewed:    The following studies were reviewed today:  Cardiac event monitor 04/12/2018 Study Highlights  NSR  Frequent PVCs - occasionally in a trigeminal pattern  No significant arrhythmias    Lexiscan Myoview 02/02/2018 Study Highlights   The left ventricular ejection fraction is hyperdynamic (>65%).  Nuclear stress EF:  68%.  Blood pressure demonstrated a normal response to exercise.  There was no ST segment deviation noted during stress.  The study is normal.  This is a low risk study.  Normal resting and stress perfusion. No ischemia or infarction EF 68%   Echocardiogram 01/15/2017 Study Conclusions  - Left ventricle: The cavity size was normal. Wall thickness was increased in a pattern of mild LVH. Systolic function was normal. The estimated ejection fraction was in the range of 60% to 65%. Wall motion was normal; there were no regional wall motion abnormalities. Doppler parameters are consistent with abnormal left ventricular relaxation (grade 1 diastolic dysfunction). - Aortic valve: Mildly calcified annulus. Trileaflet; mildly thickened leaflets. - Right ventricle: The cavity size was normal. Systolic function was normal. - Pericardium, extracardiac: A trivial pericardial effusion was identified posterior to the heart.  Left heart cath 01/15/2017 Conclusions: 1. 70% proximal/mid LAD stenosis (previously found to be hemodynamically significant with resting Pd/Pa of 0.79). 2. 70% ostial D2 stenosis. 3. Successful orbital atherectomy and PCI using Xience Sierra 3.5 x 33 mm DES with 10% proximal stenosis (post-dilated with 4.0 [place] balloon at high pressure) and TIMI-3 flow. 4. Successful PTCA to ostial D2 (including kissing balloon inflation) with reduction in stenosis to 50% and TIMI-3 flow.  Recommendations: 1. Dual antiplatelet therapy with aspirin and clopidogrel for at least 6 months, ideally longer. 2. Aggressive secondary prevention. 3. Overnight observation and cardiac rehab after discharge.   EKG:  EKG is not ordered today.    Recent Labs: 03/16/2018: ALT 17; Hemoglobin 11.5; Platelets 214 07/21/2018: BNP 51.1 09/20/2018: NT-Pro BNP 126 10/04/2018: BUN 16; Creatinine, Ser 0.83; Potassium 3.6; Sodium 139  Recent Lipid Panel    Component Value Date/Time    CHOL 130 10/07/2018 1257   TRIG 61 10/07/2018 1257   HDL 47 10/07/2018 1257   CHOLHDL 2.8 10/07/2018 1257   CHOLHDL 3 05/24/2014 1650   VLDL 20.8 05/24/2014 1650   LDLCALC 71 10/07/2018 1257    Physical Exam:    VS:  BP 118/70   Pulse 74   Ht 5' 1"  (1.549 m)   Wt 170 lb 12.8 oz (77.5 kg)   SpO2 98%   BMI 32.27 kg/m     Wt Readings from Last 3 Encounters:  10/19/18 170 lb 12.8 oz (77.5 kg)  10/07/18 171 lb 6.4 oz (77.7 kg)  09/20/18 173 lb 6.4 oz (78.7 kg)     Physical Exam  Constitutional: She is oriented to person, place, and time. She appears well-developed and well-nourished. No distress.  HENT:  Head: Normocephalic and atraumatic.  Neck: Normal range of motion. Neck supple. No JVD present.  Cardiovascular: Normal rate, regular rhythm, normal heart sounds and intact distal pulses. Exam reveals no gallop and no friction rub.  No murmur heard. Pulmonary/Chest: Effort normal and breath sounds normal. No respiratory distress. She has no wheezes. She has no rales.  Abdominal: Soft. Bowel sounds are normal.  Musculoskeletal: Normal range of motion.        General: Edema present.     Comments: Trace bilateral lower leg edema, L>R  Neurological: She is alert and oriented to person, place, and time.  Skin: Skin is warm and dry.  Psychiatric: She has a normal mood and affect. Her behavior is normal. Judgment and thought content normal.  Vitals reviewed.   ASSESSMENT:    1. Acute on chronic diastolic heart failure (HCC)   2. Leg swelling   3. Essential hypertension   4. Hyperlipidemia, unspecified hyperlipidemia type   5. 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 Obstructive sleep apnea   7. Sarcoidosis    PLAN:    In order of problems listed above:  Acute on chronic diastolic heart failure -Patient seen on 09/20/2018 with increasing lower extremity edema, mild orthopnea and worse DOE.  BNP was not elevated.  Lasix was increased to 40 mg every other day.  Follow-up metabolic panel  showed potassium at low normal.  Lower extremity edema and DOE have improved with addition of Lasix.  Patient has been having cramping in her legs related to the Lasix.  We will add K-Dur 20 mEq on days when taking Lasix and follow-up be met in 8 days.  Leg swelling -Probably a combination of diastolic heart failure and venous insufficiency.  Patient had some improvement with addition of Lasix and wearing compression stockings.  She is elevating her legs and limiting sodium intake.  Hypertension -Blood pressure is well controlled on current regimen.  Hyperlipidemia -Atorvastatin recently increased from 40 mg to 80 mg in setting of thalamus stroke.  Recent thalamic stroke -Affecting the vision in her right eye.  As above, statin was increased.  Plavix had fallen off of her medication list due to lack of renewal at the end of August.  Will reorder her Plavix 75 mg daily  OSA - reports compliance with CPAP.   Sarcoidosis -Followed by Dr. Keturah Barre  Medication Adjustments/Labs and Tests Ordered: Current medicines are reviewed at length with the patient today.  Concerns regarding medicines are outlined above. Labs and tests ordered and medication changes are outlined in the patient instructions below:  Patient Instructions  Medication Instructions:  RESUME: Plavix 75 mg once a day   START: Potassium (K-Dur) 20 meq once a day (only take when you are taking Lasix)  If you need a refill on your cardiac medications before your next appointment, please call your pharmacy.   Lab work: FUTUIRE: BMET on 10/27/2018 (lab is open from 7:30 AM to 4:30 PM)  If you have labs (blood work) drawn today and your tests are completely normal, you will receive your results only by: [name] MyChart Message (if you have MyChart) OR . A paper copy in the mail If you have any lab test that is abnormal or we need to change your treatment, we will call you to review the results.  Testing/Procedures: None     Follow-Up: Follow up with Dr. Acie Fredrickson on 12/07/2018 @ 3:00 PM   Any Other Special Instructions Will Be Listed Below (If Applicable).       Signed, Daune Perch, NP  10/19/2018 9:30 PM    [place]

## 2018-10-19 NOTE — Patient Instructions (Signed)
Medication Instructions:  RESUME: Plavix 75 mg once a day   START: Potassium (K-Dur) 20 meq once a day (only take when you are taking Lasix)  If you need a refill on your cardiac medications before your next appointment, please call your pharmacy.   Lab work: FUTUIRE: BMET on 10/27/2018 (lab is open from 7:30 AM to 4:30 PM)  If you have labs (blood work) drawn today and your tests are completely normal, you will receive your results only by: [name] MyChart Message (if you have MyChart) OR . A paper copy in the mail If you have any lab test that is abnormal or we need to change your treatment, we will call you to review the results.  Testing/Procedures: None    Follow-Up: Follow up with Dr. Acie Fredrickson on 12/07/2018 @ 3:00 PM   Any Other Special Instructions Will Be Listed Below (If Applicable).

## 2018-10-20 ENCOUNTER — Encounter: Payer: Self-pay | Admitting: Neurology

## 2018-10-20 ENCOUNTER — Ambulatory Visit: Payer: Medicare Other | Admitting: Neurology

## 2018-10-20 ENCOUNTER — Encounter: Payer: Self-pay | Admitting: Internal Medicine

## 2018-10-20 ENCOUNTER — Ambulatory Visit (INDEPENDENT_AMBULATORY_CARE_PROVIDER_SITE_OTHER): Payer: Medicare Other | Admitting: Neurology

## 2018-10-20 VITALS — BP 134/77 | HR 74 | Temp 98.0°F | Ht 61.0 in | Wt 170.0 lb

## 2018-10-20 DIAGNOSIS — I6381 Other cerebral infarction due to occlusion or stenosis of small artery: Secondary | ICD-10-CM

## 2018-10-20 NOTE — Patient Instructions (Signed)
I had a long d/w patient about his recent stroke, risk for recurrent stroke/TIAs, personally independently reviewed imaging studies and stroke evaluation results and answered questions.Continue aspirin 81 mg daily and clopidogrel 75 mg daily  for secondary stroke prevention  Given h/o drug coated stent  and maintain strict control of hypertension with blood pressure goal below 130/90, diabetes with hemoglobin A1c goal below 6.5% and lipids with LDL cholesterol goal below 70 mg/dL. I also advised the patient to eat a healthy diet with plenty of whole grains, cereals, fruits and vegetables, exercise regularly and maintain ideal body weight.  She was also counseled to be compliant with using her CPAP for her sleep apnea.  Check screening carotid ultrasound and transcranial Doppler study.  Followup in the future with me in 6 months or call earlier if necessary.  Stroke Prevention Some medical conditions and behaviors are associated with a higher chance of having a stroke. You can help prevent a stroke by making nutrition, lifestyle, and other changes, including managing any medical conditions you may have. What nutrition changes can be made?   Eat healthy foods. You can do this by: ? Choosing foods high in fiber, such as fresh fruits and vegetables and whole grains. ? Eating at least 5 or more servings of fruits and vegetables a day. Try to fill half of your plate at each meal with fruits and vegetables. ? Choosing lean protein foods, such as lean cuts of meat, poultry without skin, fish, tofu, beans, and nuts. ? Eating low-fat dairy products. ? Avoiding foods that are high in salt (sodium). This can help lower blood pressure. ? Avoiding foods that have saturated fat, trans fat, and cholesterol. This can help prevent high cholesterol. ? Avoiding processed and premade foods.  Follow your health care provider's specific guidelines for losing weight, controlling high blood pressure (hypertension), lowering  high cholesterol, and managing diabetes. These may include: ? Reducing your daily calorie intake. ? Limiting your daily sodium intake to 1,500 milligrams (mg). ? Using only healthy fats for cooking, such as olive oil, canola oil, or sunflower oil. ? Counting your daily carbohydrate intake. What lifestyle changes can be made?  Maintain a healthy weight. Talk to your health care provider about your ideal weight.  Get at least 30 minutes of moderate physical activity at least 5 days a week. Moderate activity includes brisk walking, biking, and swimming.  Do not use any products that contain nicotine or tobacco, such as cigarettes and e-cigarettes. If you need help quitting, ask your health care provider. It may also be helpful to avoid exposure to secondhand smoke.  Limit alcohol intake to no more than 1 drink a day for nonpregnant women and 2 drinks a day for men. One drink equals 12 oz of beer, 5 oz of wine, or 1 oz of hard liquor.  Stop any illegal drug use.  Avoid taking birth control pills. Talk to your health care provider about the risks of taking birth control pills if: ? You are over 50 years old. ? You smoke. ? You get migraines. ? You have ever had a blood clot. What other changes can be made?  Manage your cholesterol levels. ? Eating a healthy diet is important for preventing high cholesterol. If cholesterol cannot be managed through diet alone, you may also need to take medicines. ? Take any prescribed medicines to control your cholesterol as told by your health care provider.  Manage your diabetes. ? Eating a healthy diet and exercising regularly  are important parts of managing your blood sugar. If your blood sugar cannot be managed through diet and exercise, you may need to take medicines. ? Take any prescribed medicines to control your diabetes as told by your health care provider.  Control your hypertension. ? To reduce your risk of stroke, try to keep your blood  pressure below 130/80. ? Eating a healthy diet and exercising regularly are an important part of controlling your blood pressure. If your blood pressure cannot be managed through diet and exercise, you may need to take medicines. ? Take any prescribed medicines to control hypertension as told by your health care provider. ? Ask your health care provider if you should monitor your blood pressure at home. ? Have your blood pressure checked every year, even if your blood pressure is normal. Blood pressure increases with age and some medical conditions.  Get evaluated for sleep disorders (sleep apnea). Talk to your health care provider about getting a sleep evaluation if you snore a lot or have excessive sleepiness.  Take over-the-counter and prescription medicines only as told by your health care provider. Aspirin or blood thinners (antiplatelets or anticoagulants) may be recommended to reduce your risk of forming blood clots that can lead to stroke.  Make sure that any other medical conditions you have, such as atrial fibrillation or atherosclerosis, are managed. What are the warning signs of a stroke? The warning signs of a stroke can be easily remembered as BEFAST.  B is for balance. Signs include: ? Dizziness. ? Loss of balance or coordination. ? Sudden trouble walking.  E is for eyes. Signs include: ? A sudden change in vision. ? Trouble seeing.  F is for face. Signs include: ? Sudden weakness or numbness of the face. ? The face or eyelid drooping to one side.  A is for arms. Signs include: ? Sudden weakness or numbness of the arm, usually on one side of the body.  S is for speech. Signs include: ? Trouble speaking (aphasia). ? Trouble understanding.  T is for time. ? These symptoms may represent a serious problem that is an emergency. Do not wait to see if the symptoms will go away. Get medical help right away. Call your local emergency services (911 in the [place] Do not drive  yourself to the hospital.  Other signs of stroke may include: ? A sudden, severe headache with no known cause. ? Nausea or vomiting. ? Seizure. Where to find more information For more information, visit:  American Stroke Association: www.strokeassociation.org  National Stroke Association: www.stroke.org Summary  You can prevent a stroke by eating healthy, exercising, not smoking, limiting alcohol intake, and managing any medical conditions you may have.  Do not use any products that contain nicotine or tobacco, such as cigarettes and e-cigarettes. If you need help quitting, ask your health care provider. It may also be helpful to avoid exposure to secondhand smoke.  Remember BEFAST for warning signs of stroke. Get help right away if you or a loved one has any of these signs. This information is not intended to replace advice given to you by your health care provider. Make sure you discuss any questions you have with your health care provider. Document Released: 03/06/2004 Document Revised: 01/09/2017 Document Reviewed: 03/04/2016 Elsevier Patient Education  2020 [employer].

## 2018-10-20 NOTE — Progress Notes (Signed)
[facility] [address] [place]. [place] [address] [phone]       OFFICE CONSULT NOTE  [name] Date of Birth:  24-Mar-1945 Medical Record Number:  [id number]   Referring MD:  Glendale Chard  Reason for Referral:  stroke  HPI: Sheila Reynolds is a 73 year old African-American lady seen today for initial office consultation visit for stroke.  She is accompanied by Sheila Reynolds husband today.  History is obtained from them and review of referral notes and have personally reviewed imaging films in PACS.  The patient had been having steady decline in the vision acuity in the right eye and for which she was seen by neuro-ophthalmology recently.  They ordered an MRI of the brain and orbits which was done on 10/05/2018 which I personally reviewed MRI of the orbits was unremarkable but MRI scan of the brain showed a small left thalamic acute infarct.  Patient had no neurological symptoms at that time and this is likely an incidental finding.  Patient denied any symptoms of right-sided numbness, tingling, weakness gait or balance problems or memory problems.  She thought a few days prior to Sheila Reynolds MRI she had some twitchings in the left side of the face but this lasted only a few hours and recovered.  She denied any increased watering of the eyes lack of sensation or weakness.  Patient has no prior history of strokes, TIAs, seizures, migraines or significant neurological problems.  She had lab work on 10/07/2018 which showed LDL cholesterol of 71 mg percent.  She takes Lipitor 40 mg daily.  She had a hemoglobin A1c of 6.1 on 07/20/2018.  Sheila Reynolds blood pressure is usually well controlled and today it is 134/77.  Sheila Reynolds fasting sugars have all been good in the 110 range.  She also has sleep apnea and is quite compliant with Sheila Reynolds CPAP and takes it every night.  She has not had any vascular imaging studies done for the brain of the neck.  She has no neurological complaints today. ROS:   14 system review  of systems is positive for vision loss, facial twitching, stroke and all other systems negative  PMH:  Past Medical History:  Diagnosis Date  . Anemia    3 months ago anemic  . Anxiety    on meds  . Arthritis    "all over" (01/15/2017)  . Asthma   . Bronchitis with emphysema   . Chest pain   . Chronic bronchitis ([place])   . Coronary artery disease    a. 01/2017 she underwent orbital atherectomy/DES to the proxmal LAD and PTCA to ostial D2. 2D Echo 01/15/17 showed mild LVH, EF 60-65%, grade 1 DD.  . Family history of anesthesia complication    daughter N/V  . Fibromyalgia   . GERD (gastroesophageal reflux disease)    on meds  . Heart murmur   . History of hiatal hernia   . Hx of echocardiogram    Echo (03/2013):  Tech limited; Mild focal basal septal hypertrophy, EF 60-65%, normal RVF  . Hyperlipidemia   . Hypertension   . Nonsustained ventricular tachycardia ([place])   . OSA on CPAP   . Premature atrial contractions   . PVC (premature ventricular contraction)    a. Holter 12/16: NSR, occ PAC,PVCs  . Sarcoidosis   . Type II diabetes mellitus ([place])     Social History:  Social History   Socioeconomic History  . Marital status: Married    Spouse name: Not on file  .  Number of children: 2  . Years of education: Not on file  . Highest education level: Not on file  Occupational History  . Occupation: unemployed    Fish farm manager: Building surveyor  Social Needs  . Financial resource strain: Not hard at all  . Food insecurity    Worry: Never true    Inability: Never true  . Transportation needs    Medical: No    Non-medical: No  Tobacco Use  . Smoking status: Former Smoker    Packs/day: 1.00    Years: 4.00    Pack years: 4.00    Types: Cigarettes    Quit date: 02/11/1980    Years since quitting: 38.7  . Smokeless tobacco: Never Used  Substance and Sexual Activity  . Alcohol use: No  . Drug use: No  . Sexual activity: Yes  Lifestyle  . Physical activity     Days per week: 0 days    Minutes per session: 0 min  . Stress: Not at all  Relationships  . Social Herbalist on phone: Not on file    Gets together: Not on file    Attends religious service: Not on file    Active member of club or organization: Not on file    Attends meetings of clubs or organizations: Not on file    Relationship status: Not on file  . Intimate partner violence    Fear of current or ex partner: No    Emotionally abused: No    Physically abused: No    Forced sexual activity: No  Other Topics Concern  . Not on file  Social History Narrative  . Not on file    Medications:   Current Outpatient Medications on File Prior to Visit  Medication Sig Dispense Refill  . acetaminophen (TYLENOL) 500 MG tablet Take 1,000 mg by mouth 2 (two) times daily as needed for moderate pain or headache.    . albuterol (PROVENTIL) (2.5 MG/3ML) 0.083% nebulizer solution Take 3 mLs (2.5 mg total) by nebulization daily as needed for wheezing or shortness of breath. 75 mL 12  . albuterol (VENTOLIN HFA) 108 (90 Base) MCG/ACT inhaler Inhale 2 puffs into the lungs every 6 (six) hours as needed for wheezing or shortness of breath. 1 Inhaler 12  . aspirin EC 81 MG tablet Take 81 mg by mouth at bedtime.     [name] atorvastatin (LIPITOR) 40 MG tablet Take 40 mg by mouth 2 (two) times daily.    . brimonidine (ALPHAGAN P) 0.1 % SOLN Place 1 drop into the left eye 3 (three) times daily.     . brinzolamide (AZOPT) 1 % ophthalmic suspension Place 1 drop into the left eye 3 (three) times daily.     . clopidogrel (PLAVIX) 75 MG tablet Take 1 tablet (75 mg total) by mouth daily. 90 tablet 3  . furosemide (LASIX) 40 MG tablet Take one tablet (40mg )  Each day for 5 days starting on 09/20/18.... then one tablet EVERY OTHER day thereafter. 48 tablet 3  . guaiFENesin (ROBITUSSIN) 100 MG/5ML SOLN Take 5 mLs by mouth every 6 (six) hours as needed for cough or to loosen phlegm.     . isosorbide mononitrate  (IMDUR) 60 MG 24 hr tablet Take 1 tablet (60 mg total) by mouth daily. Please make annual appt in December for future refills. Thank you 90 tablet 0  . JANUMET 50-500 MG tablet TAKE 1 TABLET BY MOUTH TWICE DAILY WITH A MEAL 180  tablet 1  . Lancets (ONETOUCH DELICA PLUS [id number]) MISC CHECK BLOOD SUGAR BEFORE BREAKFAST AND DINNER 100 each 1  . loteprednol (LOTEMAX) 0.5 % ophthalmic suspension See admin instructions. Instill 8 DROPS IN LEFT EYE 3 IN RIGHT EYE DAILY    . montelukast (SINGULAIR) 10 MG tablet TAKE 1 TABLET BY MOUTH EVERY EVENING 90 tablet 1  . Nebulizers (COMPRESSOR/NEBULIZER) MISC Use as directed 1 each 0  . nitroGLYCERIN (NITROSTAT) 0.4 MG SL tablet Place 1 tablet (0.4 mg total) under the tongue every 5 (five) minutes as needed for chest pain. 25 tablet prn  . olmesartan (BENICAR) 20 MG tablet Take 20 mg by mouth daily.    [name] OVER THE COUNTER MEDICATION     . pantoprazole (PROTONIX) 40 MG tablet TAKE 1 TABLET(40 MG) BY MOUTH DAILY 30 tablet 7  . Polyvinyl Alcohol-Povidone (REFRESH OP) Apply to eye daily.    . potassium chloride SA (K-DUR) 20 MEQ tablet Take 1 tablet (20 mEq total) by mouth daily. Only take when taking Lasix 90 tablet 3  . prednisoLONE acetate (PRED FORTE) 1 % ophthalmic suspension 4 drops in the right eye and 6 drops in the left eye daily    . carvedilol (COREG) 12.5 MG tablet Take 1 tablet (12.5 mg total) by mouth 2 (two) times daily. 180 tablet 3  . pregabalin (LYRICA) 75 MG capsule TAKE ONE CAPSULE BY MOUTH THREE TIMES DAILY 90 capsule 3  . traMADol (ULTRAM) 50 MG tablet Take 1 tablet (50 mg total) by mouth every 6 (six) hours as needed. 40 tablet 0  . travoprost, benzalkonium, (TRAVATAN) 0.004 % ophthalmic solution Place 1 drop into the right eye at bedtime.      No current facility-administered medications on file prior to visit.     Allergies:   Allergies  Allergen Reactions  . Crestor [Rosuvastatin Calcium] Other (See Comments)    muscle aches  .  Demerol  [Meperidine Hcl]     Other reaction(s): Hallucinations  . Shellfish Allergy Itching and Other (See Comments)    Crab, shrimp and lobster ---lips itch and tingle Tingling  Was told not to eat again after having a allergy test. Lobster, crab and shrimp Crab, shrimp and lobster ---lips itch and tingle PATIENT STATES SHE HAD A POSITVE ALLERGY TEST   . Demerol [Meperidine] Other (See Comments)    Hallucinations     Physical Exam General: well developed, well nourished elderly African-American lady, seated, in no evident distress Head: head normocephalic and atraumatic.   Neck: supple with no carotid or supraclavicular bruits Cardiovascular: regular rate and rhythm, no murmurs Musculoskeletal: no deformity Skin:  no rash/petichiae Vascular:  Normal pulses all extremities  Neurologic Exam Mental Status: Awake and fully alert. Oriented to place and time. Recent and remote memory intact. Attention span, concentration and fund of knowledge appropriate. Mood and affect appropriate.  Cranial Nerves: Fundoscopic exam reveals sharp disc margins. Pupils equal, briskly reactive to light. Extraocular movements full without nystagmus. Visual fields full to confrontation.  Diminished vision acuity in the right eye.[name]  Hearing slightly diminished bilaterally. Facial sensation intact. Face, tongue, palate moves normally and symmetrically.  Motor: Normal bulk and tone. Normal strength in all tested extremity muscles. Sensory.: intact to touch , pinprick , position and vibratory sensation.  Coordination: Rapid alternating movements normal in all extremities. Finger-to-nose and heel-to-shin performed accurately bilaterally. Gait and Station: Arises from chair without difficulty. Stance is normal. Gait demonstrates normal stride length and balance . Able to heel, toe  and tandem walk with slight difficulty.  Reflexes: 1+ and symmetric. Toes downgoing.   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  Vascular risk factors of hypertension, hyperlipidemia, diabetes and sleep apnea     PLAN: I had a long d/w patient about his recent stroke, risk for recurrent stroke/TIAs, personally independently reviewed imaging studies and stroke evaluation results and answered questions.Continue aspirin 81 mg daily and clopidogrel 75 mg daily  for secondary stroke prevention  Given h/o drug coated stent  and maintain strict control of hypertension with blood pressure goal below 130/90, diabetes with hemoglobin A1c goal below 6.5% and lipids with LDL cholesterol goal below 70 mg/dL. I also advised the patient to eat a healthy diet with plenty of whole grains, cereals, fruits and vegetables, exercise regularly and maintain ideal body weight.  She was also counseled to be compliant with using Sheila Reynolds CPAP for Sheila Reynolds sleep apnea.  Check screening carotid ultrasound and transcranial Doppler study.  Greater than 50% time during this 45-minute consultation visit was spent on counseling and coordination of care about Sheila Reynolds lacunar stroke and discussion about stroke prevention and treatment and answering questions.  Followup in the future with me in 6 months or call earlier if necessary. Antony Contras, MD  [facility] [address] [place], [place] [address]  Phone [phone] Fax [phone] Note: This document was prepared with digital dictation and possible smart phrase technology. Any transcriptional errors that result from this process are unintentional.

## 2018-10-21 ENCOUNTER — Ambulatory Visit: Payer: Self-pay

## 2018-10-21 DIAGNOSIS — E1165 Type 2 diabetes mellitus with hyperglycemia: Secondary | ICD-10-CM

## 2018-10-21 DIAGNOSIS — I10 Essential (primary) hypertension: Secondary | ICD-10-CM

## 2018-10-21 DIAGNOSIS — M797 Fibromyalgia: Secondary | ICD-10-CM

## 2018-10-21 DIAGNOSIS — E559 Vitamin D deficiency, unspecified: Secondary | ICD-10-CM

## 2018-10-21 NOTE — Chronic Care Management (AMB) (Signed)
Chronic Care Management   Initial Visit Note  10/21/2018 Name: Sheila Reynolds MRN: [id number] DOB: Mar 09, 1945  Referred by: Glendale Chard, MD Reason for referral : Chronic Care Management (CCM RNCM Case Collaboration )   Sheila Reynolds is a 73 y.o. year old female who is a primary care patient of Glendale Chard, MD. The care management team was consulted for assistance with chronic disease management and care coordination needs.   Review of patient status, including review of consultants reports, relevant laboratory and other test results, and collaboration with appropriate care team members and the patient's provider was performed as part of comprehensive patient evaluation and provision of chronic care management services.    I initiated and established the plan of care for [place] during one on one collaboration with my clinical care management colleague Daneen Schick BSW who is also engaged with this patient to address social work needs.   Outpatient Encounter Medications as of 10/21/2018  Medication Sig  . acetaminophen (TYLENOL) 500 MG tablet Take 1,000 mg by mouth 2 (two) times daily as needed for moderate pain or headache.  . albuterol (PROVENTIL) (2.5 MG/3ML) 0.083% nebulizer solution Take 3 mLs (2.5 mg total) by nebulization daily as needed for wheezing or shortness of breath.  [name] albuterol (VENTOLIN HFA) 108 (90 Base) MCG/ACT inhaler Inhale 2 puffs into the lungs every 6 (six) hours as needed for wheezing or shortness of breath.  [name] aspirin EC 81 MG tablet Take 81 mg by mouth at bedtime.   [name] atorvastatin (LIPITOR) 40 MG tablet Take 40 mg by mouth 2 (two) times daily.  . brimonidine (ALPHAGAN P) 0.1 % SOLN Place 1 drop into the left eye 3 (three) times daily.   . brinzolamide (AZOPT) 1 % ophthalmic suspension Place 1 drop into the left eye 3 (three) times daily.   . carvedilol (COREG) 12.5 MG tablet Take 1 tablet (12.5 mg total) by mouth 2 (two) times daily.  .  clopidogrel (PLAVIX) 75 MG tablet Take 1 tablet (75 mg total) by mouth daily.  . furosemide (LASIX) 40 MG tablet Take one tablet (40mg )  Each day for 5 days starting on 09/20/18.... then one tablet EVERY OTHER day thereafter.  [name] guaiFENesin (ROBITUSSIN) 100 MG/5ML SOLN Take 5 mLs by mouth every 6 (six) hours as needed for cough or to loosen phlegm.   . isosorbide mononitrate (IMDUR) 60 MG 24 hr tablet Take 1 tablet (60 mg total) by mouth daily. Please make annual appt in December for future refills. Thank you  . JANUMET 50-500 MG tablet TAKE 1 TABLET BY MOUTH TWICE DAILY WITH A MEAL  . Lancets (ONETOUCH DELICA PLUS [id number]) MISC CHECK BLOOD SUGAR BEFORE BREAKFAST AND DINNER  . loteprednol (LOTEMAX) 0.5 % ophthalmic suspension See admin instructions. Instill 8 DROPS IN LEFT EYE 3 IN RIGHT EYE DAILY  . montelukast (SINGULAIR) 10 MG tablet TAKE 1 TABLET BY MOUTH EVERY EVENING  . Nebulizers (COMPRESSOR/NEBULIZER) MISC Use as directed  . nitroGLYCERIN (NITROSTAT) 0.4 MG SL tablet Place 1 tablet (0.4 mg total) under the tongue every 5 (five) minutes as needed for chest pain.  [name] olmesartan (BENICAR) 20 MG tablet Take 20 mg by mouth daily.  [name] OVER THE COUNTER MEDICATION   . pantoprazole (PROTONIX) 40 MG tablet TAKE 1 TABLET(40 MG) BY MOUTH DAILY  . Polyvinyl Alcohol-Povidone (REFRESH OP) Apply to eye daily.  . potassium chloride SA (K-DUR) 20 MEQ tablet Take 1 tablet (20 mEq total) by mouth daily. Only take  when taking Lasix  . prednisoLONE acetate (PRED FORTE) 1 % ophthalmic suspension 4 drops in the right eye and 6 drops in the left eye daily  . pregabalin (LYRICA) 75 MG capsule TAKE ONE CAPSULE BY MOUTH THREE TIMES DAILY  . traMADol (ULTRAM) 50 MG tablet Take 1 tablet (50 mg total) by mouth every 6 (six) hours as needed.  . travoprost, benzalkonium, (TRAVATAN) 0.004 % ophthalmic solution Place 1 drop into the right eye at bedtime.    No facility-administered encounter medications on file as of  10/21/2018.      Goals Addressed    . Assist with Chronic Care Management and Care Coordination needs       Current Barriers:  [name] Knowledge Barriers related to resources and support available to address needs related to Chronic Care Management and Care Coordination needs  Case Manager Clinical Goal(s):  [name] Over the next 30 days, patient will work with the CCM team to address needs related to Chronic Care Management and Care Coordination needs  Interventions:  . Collaborated with BSW and initiated plan of care to address needs related to Chronic disease management  Patient Self Care Activities:  . Patient currently unable to independently manage chronic conditions   Initial goal documentation         Telephone follow up appointment with care management team member scheduled for: 11/03/18  Barb Merino, RN, BSN, CCM Care Management Coordinator [facility]  Direct Phone: [phone]

## 2018-10-21 NOTE — Patient Instructions (Signed)
Social Worker Visit Information  Goals we discussed today:  Goals Addressed            This Visit's Progress   . Assist with enrollment into Chronic Care Management team and screen for SDOH (Social Determinants of Health)       Current Barriers:  [name] Knowledge Barriers related to resources and support available to address needs related to Chronic Care Management and challenges surrounding Social Determinants of Health  Clinical Social Work Clinical Goal(s):   Over the next 20 days, the patient will understand the role of the CCM team and work with SW to complete SDOH (Social Determinants of Health) screen.  Interventions:  Outbound call placed to the patient to introduce the Chronic Care Management program  Patient education provided regarding referral placed by Dr. Baird Cancer and obtained verbal consent for enrollment  Patient screened for SDOH (Social Determinants of Health)  Identified challenges with: No SDOH challenges identified  Provided education about CCM program and roles of each team member  Collaboration with CCM team to communicate patient's enrollment into the program  Patient Self Care Activities:   Patient currently unable to independently manage chronic conditions   Initial goal documentation:         Materials provided: Verbal education about CCM proram provided by phone  Ms. Belcastro was given information about Chronic Care Management services today including:  1. CCM service includes personalized support from designated clinical staff supervised by her physician, including individualized plan of care and coordination with other care providers 2. 24/7 contact phone numbers for assistance for urgent and routine care needs. 3. Service will only be billed when office clinical staff spend 20 minutes or more in a month to coordinate care. 4. Only one practitioner may furnish and bill the service in a calendar month. 5. The patient may stop CCM services at any  time (effective at the end of the month) by phone call to the office staff. 6. The patient will be responsible for cost sharing (co-pay) of up to 20% of the service fee (after annual deductible is met).  Patient agreed to services and verbal consent obtained.   The patient verbalized understanding of instructions provided today and declined a print copy of patient instruction materials.   Follow up plan: No further SW follow up planned. The patient is encouraged to call SW for future resource needs.   Daneen Schick, BSW, CDP Social Worker, Certified Dementia Practitioner [place] [facility] [phone]

## 2018-10-21 NOTE — Chronic Care Management (AMB) (Signed)
Chronic Care Management    Social Work General Note  10/21/2018 Name: Sheila Reynolds MRN: [id number] DOB: March 14, 1945  Sheila Reynolds is a 73 y.o. year old female who is a primary care patient of Glendale Chard, MD. The CCM was consulted to assist the patient with care management assistance of patient chronic conditions.  [name] was given information about Chronic Care Management services today including:  1. CCM service includes personalized support from designated clinical staff supervised by her physician, including individualized plan of care and coordination with other care providers 2. 24/7 contact phone numbers for assistance for urgent and routine care needs. 3. Service will only be billed when office clinical staff spend 20 minutes or more in a month to coordinate care. 4. Only one practitioner may furnish and bill the service in a calendar month. 5. The patient may stop CCM services at any time (effective at the end of the month) by phone call to the office staff. 6. The patient will be responsible for cost sharing (co-pay) of up to 20% of the service fee (after annual deductible is met).  Patient agreed to services and verbal consent obtained.   Review of patient status, including review of consultants reports, relevant laboratory and other test results, and collaboration with appropriate care team members and the patient's provider was performed as part of comprehensive patient evaluation and provision of chronic care management services.    SDOH (Social Determinants of Health) screening performed today. See Care Plan Entry related to challenges with: None  Outpatient Encounter Medications as of 10/21/2018  Medication Sig  . acetaminophen (TYLENOL) 500 MG tablet Take 1,000 mg by mouth 2 (two) times daily as needed for moderate pain or headache.  . albuterol (PROVENTIL) (2.5 MG/3ML) 0.083% nebulizer solution Take 3 mLs (2.5 mg total) by nebulization daily as needed for  wheezing or shortness of breath.  [name] albuterol (VENTOLIN HFA) 108 (90 Base) MCG/ACT inhaler Inhale 2 puffs into the lungs every 6 (six) hours as needed for wheezing or shortness of breath.  [name] aspirin EC 81 MG tablet Take 81 mg by mouth at bedtime.   [name] atorvastatin (LIPITOR) 40 MG tablet Take 40 mg by mouth 2 (two) times daily.  . brimonidine (ALPHAGAN P) 0.1 % SOLN Place 1 drop into the left eye 3 (three) times daily.   . brinzolamide (AZOPT) 1 % ophthalmic suspension Place 1 drop into the left eye 3 (three) times daily.   . carvedilol (COREG) 12.5 MG tablet Take 1 tablet (12.5 mg total) by mouth 2 (two) times daily.  . clopidogrel (PLAVIX) 75 MG tablet Take 1 tablet (75 mg total) by mouth daily.  . furosemide (LASIX) 40 MG tablet Take one tablet (18m)  Each day for 5 days starting on 09/20/18.... then one tablet EVERY OTHER day thereafter.  .Marland KitchenguaiFENesin (ROBITUSSIN) 100 MG/5ML SOLN Take 5 mLs by mouth every 6 (six) hours as needed for cough or to loosen phlegm.   . isosorbide mononitrate (IMDUR) 60 MG 24 hr tablet Take 1 tablet (60 mg total) by mouth daily. Please make annual appt in December for future refills. Thank you  . JANUMET 50-500 MG tablet TAKE 1 TABLET BY MOUTH TWICE DAILY WITH A MEAL  . Lancets (ONETOUCH DELICA PLUS [id number] MISC CHECK BLOOD SUGAR BEFORE BREAKFAST AND DINNER  . loteprednol (LOTEMAX) 0.5 % ophthalmic suspension See admin instructions. Instill 8 DROPS IN LEFT EYE 3 IN RIGHT EYE DAILY  . montelukast (SINGULAIR) 10 MG tablet TAKE  1 TABLET BY MOUTH EVERY EVENING  . Nebulizers (COMPRESSOR/NEBULIZER) MISC Use as directed  . nitroGLYCERIN (NITROSTAT) 0.4 MG SL tablet Place 1 tablet (0.4 mg total) under the tongue every 5 (five) minutes as needed for chest pain.  [name] olmesartan (BENICAR) 20 MG tablet Take 20 mg by mouth daily.  [name] OVER THE COUNTER MEDICATION   . pantoprazole (PROTONIX) 40 MG tablet TAKE 1 TABLET(40 MG) BY MOUTH DAILY  . Polyvinyl Alcohol-Povidone (REFRESH OP)  Apply to eye daily.  . potassium chloride SA (K-DUR) 20 MEQ tablet Take 1 tablet (20 mEq total) by mouth daily. Only take when taking Lasix  . prednisoLONE acetate (PRED FORTE) 1 % ophthalmic suspension 4 drops in the right eye and 6 drops in the left eye daily  . pregabalin (LYRICA) 75 MG capsule TAKE ONE CAPSULE BY MOUTH THREE TIMES DAILY  . traMADol (ULTRAM) 50 MG tablet Take 1 tablet (50 mg total) by mouth every 6 (six) hours as needed.  . travoprost, benzalkonium, (TRAVATAN) 0.004 % ophthalmic solution Place 1 drop into the right eye at bedtime.    No facility-administered encounter medications on file as of 10/21/2018.     Goals Addressed            This Visit's Progress   . Assist with enrollment into Chronic Care Management team and screen for SDOH (Social Determinants of Health)       Current Barriers:  [name] Knowledge Barriers related to resources and support available to address needs related to Chronic Care Management and challenges surrounding Social Determinants of Health  Clinical Social Work Clinical Goal(s):   Over the next 20 days, the patient will understand the role of the CCM team and work with SW to complete SDOH (Social Determinants of Health) screen.  Interventions:  Outbound call placed to the patient to introduce the Chronic Care Management program  Patient education provided regarding referral placed by Dr. Baird Cancer and obtained verbal consent for enrollment  Patient screened for SDOH (Social Determinants of Health)  Identified challenges with: No SDOH challenges identified  Provided education about CCM program and roles of each team member  Collaboration with CCM team to communicate patient's enrollment into the program  Patient Self Care Activities:   Patient currently unable to independently manage chronic conditions   Initial goal documentation:         Follow Up Plan: No further follow up planned by CCM SW. The patient will be followed by CCM  RN Case Manager. The patient has been advised to expect a call in the coming weeks from RN Case Manager.  Daneen Schick, BSW, CDP Social Worker, Certified Dementia Practitioner [place] [facility] [employer] [facility] [phone]  Total time spent performing care coordination and/or care management activities with the patient by phone or face to face = 8 minutes.

## 2018-10-25 ENCOUNTER — Other Ambulatory Visit: Payer: Self-pay | Admitting: Internal Medicine

## 2018-10-25 DIAGNOSIS — H2013 Chronic iridocyclitis, bilateral: Secondary | ICD-10-CM | POA: Diagnosis not present

## 2018-10-25 DIAGNOSIS — H401122 Primary open-angle glaucoma, left eye, moderate stage: Secondary | ICD-10-CM | POA: Diagnosis not present

## 2018-10-25 DIAGNOSIS — H401113 Primary open-angle glaucoma, right eye, severe stage: Secondary | ICD-10-CM | POA: Diagnosis not present

## 2018-10-25 MED ORDER — PREGABALIN 75 MG PO CAPS: 75.0000 mg | ORAL_CAPSULE | Freq: Two times a day (BID) | ORAL | 1 refills | Status: DC

## 2018-10-27 ENCOUNTER — Other Ambulatory Visit: Payer: Medicare Other | Admitting: *Deleted

## 2018-10-27 ENCOUNTER — Other Ambulatory Visit: Payer: Self-pay

## 2018-10-27 DIAGNOSIS — I10 Essential (primary) hypertension: Secondary | ICD-10-CM

## 2018-10-28 LAB — BASIC METABOLIC PANEL
BUN/Creatinine Ratio: 26 (ref 12–28)
BUN: 18 mg/dL (ref 8–27)
CO2: 24 mmol/L (ref 20–29)
Calcium: 9.4 mg/dL (ref 8.7–10.3)
Chloride: 102 mmol/L (ref 96–106)
Creatinine, Ser: 0.7 mg/dL (ref 0.57–1.00)
GFR calc Af Amer: 101 mL/min/{1.73_m2} (ref >59)
GFR calc non Af Amer: 87 mL/min/{1.73_m2} (ref >59)
Glucose: 118 mg/dL — ABNORMAL HIGH (ref 65–99)
Potassium: 4.4 mmol/L (ref 3.5–5.2)
Sodium: 139 mmol/L (ref 134–144)

## 2018-10-29 ENCOUNTER — Ambulatory Visit (HOSPITAL_COMMUNITY)
Admission: RE | Admit: 2018-10-29 | Discharge: 2018-10-29 | Disposition: A | Discharge status: Home / Self Care | Payer: Medicare Other | Source: Ambulatory Visit | Attending: Neurology | Admitting: Neurology

## 2018-10-29 ENCOUNTER — Other Ambulatory Visit: Payer: Self-pay

## 2018-10-29 ENCOUNTER — Ambulatory Visit (HOSPITAL_BASED_OUTPATIENT_CLINIC_OR_DEPARTMENT_OTHER)
Admission: RE | Admit: 2018-10-29 | Discharge: 2018-10-29 | Disposition: A | Discharge status: Home / Self Care | Payer: Medicare Other | Source: Ambulatory Visit | Attending: Neurology | Admitting: Neurology

## 2018-10-29 DIAGNOSIS — I6381 Other cerebral infarction due to occlusion or stenosis of small artery: Secondary | ICD-10-CM

## 2018-11-03 ENCOUNTER — Telehealth: Payer: Self-pay

## 2018-11-03 DIAGNOSIS — H35373 Puckering of macula, bilateral: Secondary | ICD-10-CM | POA: Diagnosis not present

## 2018-11-03 DIAGNOSIS — H20013 Primary iridocyclitis, bilateral: Secondary | ICD-10-CM | POA: Diagnosis not present

## 2018-11-07 ENCOUNTER — Other Ambulatory Visit: Payer: Self-pay | Admitting: Cardiovascular Disease

## 2018-11-09 ENCOUNTER — Other Ambulatory Visit: Payer: Self-pay | Admitting: Cardiovascular Disease

## 2018-11-09 MED ORDER — ATORVASTATIN CALCIUM 40 MG PO TABS: 40.0000 mg | ORAL_TABLET | Freq: Two times a day (BID) | ORAL | 3 refills | Status: DC

## 2018-11-12 ENCOUNTER — Telehealth: Payer: Self-pay

## 2018-11-12 MED ORDER — ATORVASTATIN CALCIUM 80 MG PO TABS: 80.0000 mg | ORAL_TABLET | Freq: Every day | ORAL | 3 refills | Status: DC

## 2018-11-12 NOTE — Telephone Encounter (Signed)
I am not sure why the prescription was written for atorvastatin 40 mg twice daily A new Rx has been sent for atorvastatin 80 mg daily

## 2018-11-12 NOTE — Telephone Encounter (Signed)
**Note De-Identified  Obfuscation** We received a PA request for the pts Atorvastatin 40 mg take 1 tablet BID. The pts ins provider will not cover a medication written for 40 mg 60 tablets/30 days when a 80 mg tablet is available and only requires 30 tablets a month.  Does Dr Acie Fredrickson want the pt to take Atorvastatin 40 mg BID or can it be changed to 80 mg once daily?  Please advise.

## 2018-11-22 ENCOUNTER — Ambulatory Visit: Payer: Medicare Other | Admitting: Internal Medicine

## 2018-11-23 ENCOUNTER — Other Ambulatory Visit: Payer: Self-pay

## 2018-11-23 ENCOUNTER — Ambulatory Visit (INDEPENDENT_AMBULATORY_CARE_PROVIDER_SITE_OTHER): Payer: Medicare Other | Admitting: Internal Medicine

## 2018-11-23 ENCOUNTER — Ambulatory Visit: Payer: Self-pay

## 2018-11-23 ENCOUNTER — Encounter: Payer: Self-pay | Admitting: Internal Medicine

## 2018-11-23 VITALS — BP 132/78 | HR 74 | Temp 98.1°F | Ht 61.2 in | Wt 170.0 lb

## 2018-11-23 DIAGNOSIS — Z23 Encounter for immunization: Secondary | ICD-10-CM | POA: Diagnosis not present

## 2018-11-23 DIAGNOSIS — Z6831 Body mass index (BMI) 31.0-31.9, adult: Secondary | ICD-10-CM | POA: Diagnosis not present

## 2018-11-23 DIAGNOSIS — E1165 Type 2 diabetes mellitus with hyperglycemia: Secondary | ICD-10-CM | POA: Diagnosis not present

## 2018-11-23 DIAGNOSIS — R2689 Other abnormalities of gait and mobility: Secondary | ICD-10-CM | POA: Diagnosis not present

## 2018-11-23 DIAGNOSIS — I639 Cerebral infarction, unspecified: Secondary | ICD-10-CM

## 2018-11-23 DIAGNOSIS — E6609 Other obesity due to excess calories: Secondary | ICD-10-CM

## 2018-11-23 DIAGNOSIS — I6381 Other cerebral infarction due to occlusion or stenosis of small artery: Secondary | ICD-10-CM | POA: Diagnosis not present

## 2018-11-23 DIAGNOSIS — I10 Essential (primary) hypertension: Secondary | ICD-10-CM | POA: Diagnosis not present

## 2018-11-23 NOTE — Chronic Care Management (AMB) (Signed)
Chronic Care Management   Social Work Follow Up Note  11/23/2018 Name: Sheila Reynolds MRN: [id number] DOB: 1945/10/24  Sheila Reynolds is a 73 y.o. year old female who is a primary care patient of Glendale Chard, MD. The CCM team was consulted for assistance with care coordination and disease management.   Review of patient status, including review of consultants reports, other relevant assessments, and collaboration with appropriate care team members and the patient's provider was performed as part of comprehensive patient evaluation and provision of chronic care management services.    SW received referral for patient to enroll in case management program. Patient is currently enrolled. See care plan below.   Outpatient Encounter Medications as of 11/23/2018  Medication Sig  . acetaminophen (TYLENOL) 500 MG tablet Take 1,000 mg by mouth 2 (two) times daily as needed for moderate pain or headache.  . albuterol (PROVENTIL) (2.5 MG/3ML) 0.083% nebulizer solution Take 3 mLs (2.5 mg total) by nebulization daily as needed for wheezing or shortness of breath.  [name] albuterol (VENTOLIN HFA) 108 (90 Base) MCG/ACT inhaler Inhale 2 puffs into the lungs every 6 (six) hours as needed for wheezing or shortness of breath.  [name] aspirin EC 81 MG tablet Take 81 mg by mouth at bedtime.   [name] atorvastatin (LIPITOR) 80 MG tablet Take 1 tablet (80 mg total) by mouth daily.  . brimonidine (ALPHAGAN P) 0.1 % SOLN Place 1 drop into the left eye 3 (three) times daily.   . brinzolamide (AZOPT) 1 % ophthalmic suspension Place 1 drop into the left eye 3 (three) times daily.   . carvedilol (COREG) 12.5 MG tablet TAKE 1 TABLET(12.5 MG) BY MOUTH TWICE DAILY  . clopidogrel (PLAVIX) 75 MG tablet Take 1 tablet (75 mg total) by mouth daily.  . furosemide (LASIX) 40 MG tablet Take one tablet (40mg )  Each day for 5 days starting on 09/20/18.... then one tablet EVERY OTHER day thereafter.  [name] guaiFENesin (ROBITUSSIN) 100 MG/5ML  SOLN Take 5 mLs by mouth every 6 (six) hours as needed for cough or to loosen phlegm.   . isosorbide mononitrate (IMDUR) 60 MG 24 hr tablet Take 1 tablet (60 mg total) by mouth daily. Please make annual appt in December for future refills. Thank you  . JANUMET 50-500 MG tablet TAKE 1 TABLET BY MOUTH TWICE DAILY WITH A MEAL  . Lancets (ONETOUCH DELICA PLUS [id number]) MISC CHECK BLOOD SUGAR BEFORE BREAKFAST AND DINNER  . loteprednol (LOTEMAX) 0.5 % ophthalmic suspension See admin instructions. Instill 8 DROPS IN LEFT EYE 3 IN RIGHT EYE DAILY  . montelukast (SINGULAIR) 10 MG tablet TAKE 1 TABLET BY MOUTH EVERY EVENING  . Nebulizers (COMPRESSOR/NEBULIZER) MISC Use as directed  . nitroGLYCERIN (NITROSTAT) 0.4 MG SL tablet Place 1 tablet (0.4 mg total) under the tongue every 5 (five) minutes as needed for chest pain.  [name] olmesartan (BENICAR) 20 MG tablet Take 20 mg by mouth daily.  [name] OVER THE COUNTER MEDICATION   . pantoprazole (PROTONIX) 40 MG tablet TAKE 1 TABLET(40 MG) BY MOUTH DAILY  . Polyvinyl Alcohol-Povidone (REFRESH OP) Apply to eye daily.  . potassium chloride SA (K-DUR) 20 MEQ tablet Take 1 tablet (20 mEq total) by mouth daily. Only take when taking Lasix  . prednisoLONE acetate (PRED FORTE) 1 % ophthalmic suspension 4 drops in the right eye and 6 drops in the left eye daily  . pregabalin (LYRICA) 75 MG capsule Take 1 capsule (75 mg total) by mouth 2 (two) times daily.  [name]  traMADol (ULTRAM) 50 MG tablet Take 1 tablet (50 mg total) by mouth every 6 (six) hours as needed.  . travoprost, benzalkonium, (TRAVATAN) 0.004 % ophthalmic solution Place 1 drop into the right eye at bedtime.    No facility-administered encounter medications on file as of 11/23/2018.      Goals Addressed            This Visit's Progress   . Assist with care coordination related to referral for Janumet       Current Barriers:  . Unknown at this time  Clinical Social Work Clinical Goal(s):  [name] Over the next 45  days the patient will work with embedded PharmD to address concerns related to [facility] prescription.  CCM SW Interventions: Completed 11/23/2018 . Referral received from patient provider Dr. Glendale Chard indicating assistance is needed with medication Janumet . Performed chart review to conclude the patient enrolled in case management program on October 21 2018. At the time of enrollment, patient did not indicate medication concerns therefore embedded PharmD was not involved . Communication with embedded PharmD to request patient outreach to provide assistance . Closed referral considering the patient is already active with CCM program  Patient Self Care Activities:  . Self administers medications as prescribed . Performs ADL's independently . Performs IADL's independently  Initial goal documentation     . COMPLETED: Assist with enrollment into Chronic Care Management team and screen for SDOH (Social Determinants of Health)       Current Barriers:  [name] Knowledge Barriers related to resources and support available to address needs related to Chronic Care Management and challenges surrounding Social Determinants of Health  Clinical Social Work Clinical Goal(s):   Over the next 20 days, the patient will understand the role of the CCM team and work with SW to complete SDOH (Social Determinants of Health) screen.  Interventions:  Outbound call placed to the patient to introduce the Chronic Care Management program  Patient education provided regarding referral placed by Dr. Baird Cancer and obtained verbal consent for enrollment  Patient screened for SDOH (Social Determinants of Health)  Identified challenges with: No SDOH challenges identified  Provided education about CCM program and roles of each team member  Collaboration with CCM team to communicate patient's enrollment into the program  Patient Self Care Activities:   Patient currently unable to independently manage chronic  conditions   Initial goal documentation:         Follow Up Plan: Patient will be contacted by embedded PharmD over the next 3-4 weeks.   Daneen Schick, BSW, CDP Social Worker, Certified Dementia Practitioner [place] [facility] [place] [facility] [phone]  Total time spent performing care coordination and/or care management activities with the patient by phone or face to face = 3 minutes.

## 2018-11-23 NOTE — Patient Instructions (Signed)
Social Worker Visit Information  Goals we discussed today:  Goals Addressed            This Visit's Progress   . Assist with care coordination related to referral for Janumet       Current Barriers:  . Unknown at this time  Clinical Social Work Clinical Goal(s):  [name] Over the next 45 days the patient will work with embedded PharmD to address concerns related to [facility] prescription.  CCM SW Interventions: Completed 11/23/2018 . Referral received from patient provider Dr. Glendale Chard indicating assistance is needed with medication Janumet . Performed chart review to conclude the patient enrolled in case management program on October 21 2018. At the time of enrollment, patient did not indicate medication concerns therefore embedded PharmD was not involved . Communication with embedded PharmD to request patient outreach to provide assistance . Closed referral considering the patient is already active with CCM program  Patient Self Care Activities:  . Self administers medications as prescribed . Performs ADL's independently . Performs IADL's independently  Initial goal documentation           Follow Up Plan: No planned SW follow up at this time as patient denied resource needs during enrollment call. Patient will be contacted by embedded PharmD to provide medication assistance.    Daneen Schick, BSW, CDP Social Worker, Certified Dementia Practitioner [place] [facility] [place] [facility] [phone]

## 2018-11-24 LAB — HEMOGLOBIN A1C
Est. average glucose Bld gHb Est-mCnc: 128 mg/dL
Hgb A1c MFr Bld: 6.1 % — ABNORMAL HIGH (ref 4.8–5.6)

## 2018-11-26 ENCOUNTER — Telehealth: Payer: Self-pay

## 2018-11-26 NOTE — Telephone Encounter (Signed)
Left the patient a message to call back for lab results. 

## 2018-11-26 NOTE — Telephone Encounter (Signed)
-----   Message from Glendale Chard, MD sent at 11/24/2018  7:22 PM EDT ----- Here are your lab results:  Your hba1c is 6.1, this is great. Continue with current meds.   Sincerely,    Robyn N. Baird Cancer, MD

## 2018-11-27 NOTE — Progress Notes (Signed)
Subjective:     Patient ID: Sheila Reynolds , female    DOB: [birth year] , 73 y.o.   MRN: [id number]   Chief Complaint  Patient presents with  . Diabetes  . Hypertension    HPI  Diabetes She presents for her follow-up diabetic visit. She has type 2 diabetes mellitus. Her disease course has been stable. There are no hypoglycemic associated symptoms. Pertinent negatives for diabetes include no blurred vision and no chest pain. There are no hypoglycemic complications. Diabetic complications include heart disease. Risk factors for coronary artery disease include diabetes mellitus, dyslipidemia, hypertension, post-menopausal and sedentary lifestyle. Current diabetic treatment includes oral agent (monotherapy). Her weight is stable. She is following a diabetic diet. She never participates in exercise. Her breakfast blood glucose is taken between 7-8 am. Her breakfast blood glucose range is generally 90-110 mg/dl. An ACE inhibitor/angiotensin II receptor blocker is being taken. Eye exam is current.  Hypertension This is a chronic problem. The current episode started more than 1 year ago. The problem has been gradually improving since onset. The problem is controlled. Pertinent negatives include no blurred vision, chest pain, palpitations or shortness of breath. The current treatment provides moderate improvement. Compliance problems include exercise.      Past Medical History:  Diagnosis Date  . Anemia    3 months ago anemic  . Anxiety    on meds  . Arthritis    "all over" (01/15/2017)  . Asthma   . Bronchitis with emphysema   . Chest pain   . Chronic bronchitis ([place])   . Coronary artery disease    a. 01/2017 she underwent orbital atherectomy/DES to the proxmal LAD and PTCA to ostial D2. 2D Echo 01/15/17 showed mild LVH, EF 60-65%, grade 1 DD.  . Family history of anesthesia complication    daughter N/V  . Fibromyalgia   . GERD (gastroesophageal reflux disease)    on meds  . Heart  murmur   . History of hiatal hernia   . Hx of echocardiogram    Echo (03/2013):  Tech limited; Mild focal basal septal hypertrophy, EF 60-65%, normal RVF  . Hyperlipidemia   . Hypertension   . Nonsustained ventricular tachycardia ([place])   . OSA on CPAP   . Premature atrial contractions   . PVC (premature ventricular contraction)    a. Holter 12/16: NSR, occ PAC,PVCs  . Sarcoidosis   . Type II diabetes mellitus ([facility])      Family History  Problem Relation Age of Onset  . Heart disease Father   . Diabetes Father   . Glaucoma Father   . Cancer Mother        unknown type, ?lung  . Heart disease Paternal Grandmother   . Cancer Paternal Grandmother        unknown type  . Diabetes Brother   . Glaucoma Brother      Current Outpatient Medications:  .  acetaminophen (TYLENOL) 500 MG tablet, Take 1,000 mg by mouth 2 (two) times daily as needed for moderate pain or headache., Disp: , Rfl:  .  albuterol (PROVENTIL) (2.5 MG/3ML) 0.083% nebulizer solution, Take 3 mLs (2.5 mg total) by nebulization daily as needed for wheezing or shortness of breath., Disp: 75 mL, Rfl: 12 .  albuterol (VENTOLIN HFA) 108 (90 Base) MCG/ACT inhaler, Inhale 2 puffs into the lungs every 6 (six) hours as needed for wheezing or shortness of breath., Disp: 1 Inhaler, Rfl: 12 .  aspirin EC 81 MG tablet, Take 81  mg by mouth at bedtime. , Disp: , Rfl:  .  atorvastatin (LIPITOR) 80 MG tablet, Take 1 tablet (80 mg total) by mouth daily., Disp: 90 tablet, Rfl: 3 .  brimonidine (ALPHAGAN P) 0.1 % SOLN, Place 1 drop into the left eye 3 (three) times daily. , Disp: , Rfl:  .  brinzolamide (AZOPT) 1 % ophthalmic suspension, Place 1 drop into the left eye 3 (three) times daily. , Disp: , Rfl:  .  carvedilol (COREG) 12.5 MG tablet, TAKE 1 TABLET(12.5 MG) BY MOUTH TWICE DAILY, Disp: 180 tablet, Rfl: 3 .  clopidogrel (PLAVIX) 75 MG tablet, Take 1 tablet (75 mg total) by mouth daily., Disp: 90 tablet, Rfl: 3 .  furosemide (LASIX) 40  MG tablet, Take one tablet (40mg )  Each day for 5 days starting on 09/20/18.... then one tablet EVERY OTHER day thereafter., Disp: 48 tablet, Rfl: 3 .  guaiFENesin (ROBITUSSIN) 100 MG/5ML SOLN, Take 5 mLs by mouth every 6 (six) hours as needed for cough or to loosen phlegm. , Disp: , Rfl:  .  isosorbide mononitrate (IMDUR) 60 MG 24 hr tablet, Take 1 tablet (60 mg total) by mouth daily. Please make annual appt in December for future refills. Thank you, Disp: 90 tablet, Rfl: 0 .  JANUMET 50-500 MG tablet, TAKE 1 TABLET BY MOUTH TWICE DAILY WITH A MEAL, Disp: 180 tablet, Rfl: 1 .  Lancets (ONETOUCH DELICA PLUS 123XX123) MISC, CHECK BLOOD SUGAR BEFORE BREAKFAST AND DINNER, Disp: 100 each, Rfl: 1 .  loteprednol (LOTEMAX) 0.5 % ophthalmic suspension, See admin instructions. Instill 8 DROPS IN LEFT EYE 3 IN RIGHT EYE DAILY, Disp: , Rfl:  .  montelukast (SINGULAIR) 10 MG tablet, TAKE 1 TABLET BY MOUTH EVERY EVENING, Disp: 90 tablet, Rfl: 1 .  Nebulizers (COMPRESSOR/NEBULIZER) MISC, Use as directed, Disp: 1 each, Rfl: 0 .  nitroGLYCERIN (NITROSTAT) 0.4 MG SL tablet, Place 1 tablet (0.4 mg total) under the tongue every 5 (five) minutes as needed for chest pain., Disp: 25 tablet, Rfl: prn .  olmesartan (BENICAR) 20 MG tablet, Take 20 mg by mouth daily., Disp: , Rfl:  .  OVER THE COUNTER MEDICATION, , Disp: , Rfl:  .  pantoprazole (PROTONIX) 40 MG tablet, TAKE 1 TABLET(40 MG) BY MOUTH DAILY, Disp: 30 tablet, Rfl: 7 .  Polyvinyl Alcohol-Povidone (REFRESH OP), Apply to eye daily., Disp: , Rfl:  .  potassium chloride SA (K-DUR) 20 MEQ tablet, Take 1 tablet (20 mEq total) by mouth daily. Only take when taking Lasix, Disp: 90 tablet, Rfl: 3 .  prednisoLONE acetate (PRED FORTE) 1 % ophthalmic suspension, 4 drops in the right eye and 6 drops in the left eye daily, Disp: , Rfl:  .  pregabalin (LYRICA) 75 MG capsule, Take 1 capsule (75 mg total) by mouth 2 (two) times daily., Disp: 180 capsule, Rfl: 1 .  traMADol  (ULTRAM) 50 MG tablet, Take 1 tablet (50 mg total) by mouth every 6 (six) hours as needed., Disp: 40 tablet, Rfl: 0 .  travoprost, benzalkonium, (TRAVATAN) 0.004 % ophthalmic solution, Place 1 drop into the right eye at bedtime. , Disp: , Rfl:    Allergies  Allergen Reactions  . Crestor [Rosuvastatin Calcium] Other (See Comments)    muscle aches  . Demerol  [Meperidine Hcl]     Other reaction(s): Hallucinations  . Shellfish Allergy Itching and Other (See Comments)    Crab, shrimp and lobster ---lips itch and tingle Tingling  Was told not to eat again after having  a allergy test. Lobster, crab and shrimp Crab, shrimp and lobster ---lips itch and tingle PATIENT STATES SHE HAD A POSITVE ALLERGY TEST   . Demerol [Meperidine] Other (See Comments)    Hallucinations      Review of Systems  Constitutional: Negative.   Eyes: Negative for blurred vision.  Respiratory: Negative.  Negative for shortness of breath.   Cardiovascular: Negative.  Negative for chest pain and palpitations.  Gastrointestinal: Negative.   Neurological: Negative.        She has been seen by Neuro since her stroke. She reports feeling off balance. She is ambulatory without assistance, but states she doesn't feel steady. She has not had any recurrent stroke symptoms.   Psychiatric/Behavioral: Negative.      Today's Vitals   11/23/18 1201  BP: 132/78  Pulse: 74  Temp: 98.1 F (36.7 C)  TempSrc: Oral  SpO2: 99%  Weight: 170 lb (77.1 kg)  Height: 5' 1.2" (1.554 m)   Body mass index is 31.91 kg/m.   Objective:  Physical Exam Vitals signs and nursing note reviewed.  Constitutional:      Appearance: Normal appearance.  HENT:     Head: Normocephalic and atraumatic.  Cardiovascular:     Rate and Rhythm: Normal rate and regular rhythm.     Heart sounds: Normal heart sounds.  Pulmonary:     Effort: Pulmonary effort is normal.     Breath sounds: Normal breath sounds.  Skin:    General: Skin is warm.   Neurological:     General: No focal deficit present.     Mental Status: She is alert.  Psychiatric:        Mood and Affect: Mood normal.        Behavior: Behavior normal.         Assessment And Plan:     1. Uncontrolled type 2 diabetes mellitus with hyperglycemia ([place])  I will check an a1c today. She agrees to [facility] referral to both nursing and medication management. She reports Januvia is one of her most expensive medications.   - Hemoglobin A1c - Referral to Chronic Care Management Services  2. Essential hypertension  Chronic, fair control. She will continue with current meds for now. She is encouraged to follow a low sodium diet.   - Referral to Chronic Care Management Services  3. Left thalamic infarction [facility])  Neuro note reviewed in full detail during her visit.   - Ambulatory referral to Physical Therapy  4. Abnormality of gait due to impairment of balance  She agrees to outpatient physical therapy. She is willing to try [facility], located across our parking lot.   - Ambulatory referral to Physical Therapy  5. Class 1 obesity due to excess calories with serious comorbidity and body mass index (BMI) of 31.0 to 31.9 in adult  She is encouraged to strive for BMI less than 29 to decrease cardiovascular/cerebrovascular risk.   6. Flu vaccine need  She was given high dose flu vaccine.   Maximino Greenland, MD    THE PATIENT IS ENCOURAGED TO PRACTICE SOCIAL DISTANCING DUE TO THE COVID-19 PANDEMIC.

## 2018-11-29 ENCOUNTER — Telehealth: Payer: Self-pay

## 2018-11-30 ENCOUNTER — Ambulatory Visit (INDEPENDENT_AMBULATORY_CARE_PROVIDER_SITE_OTHER): Payer: Medicare Other | Admitting: Pharmacist

## 2018-11-30 DIAGNOSIS — E1165 Type 2 diabetes mellitus with hyperglycemia: Secondary | ICD-10-CM

## 2018-11-30 DIAGNOSIS — I10 Essential (primary) hypertension: Secondary | ICD-10-CM | POA: Diagnosis not present

## 2018-12-06 ENCOUNTER — Other Ambulatory Visit: Payer: Self-pay | Admitting: Cardiovascular Disease

## 2018-12-06 ENCOUNTER — Other Ambulatory Visit: Payer: Self-pay

## 2018-12-06 MED ORDER — MONTELUKAST SODIUM 10 MG PO TABS: 10.0000 mg | ORAL_TABLET | Freq: Every evening | ORAL | 1 refills | Status: DC

## 2018-12-06 NOTE — Progress Notes (Signed)
Cardiology Office Note   Date:  12/07/2018   ID:  Sheila Reynolds, Sheila Reynolds [birth year], MRN [id number]  PCP:  Glendale Chard, MD  Cardiologist:   Mertie Moores, MD   Chief Complaint  Patient presents with  . Coronary Artery Disease   1. Hyperlipidemia 2.  CAD -  orbital atherectomy and PCI using Xience Sierra 3.5 x 33 mm DES to prox / mid LAD  3. Nonsustained ventricular tachycardia 4. Mild to moderate coronary artery disease by cath in 2009. 5. 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 Darrick Penna is a 73 y.o. female with the above noted hx. She has continued to have some palpitations. She has occasional episodes of lightheadedness when she has palpitations. Her main issue has been lots of total body cramps. She has been seen in the emergency room. Her lab work looked fine.  She's had lots of dyspnea especially with exertion. She has a history of sarcoidosis and is followed by Dr. Annamaria Boots.  March 11, 2012: Sheila Reynolds has had more palpitations recently. It Appears that her metoprolol dose was decreased slightly. We placed an event monitor on her. She did not have any episodes of atrial fibrillation. She has occasional drinker atrial contractions and occasional premature ventricular contractions. There were no life-threatening arrhythmias.  She is doing well from a cardiac standpoint. She's not had any episodes of syncope. She has significant shortness of breath especially when climbing stairs. This is likely due to her sarcoidosis. She denies any chest pain. Her BP is a bit elevated. She avoids salt.   May 02, 2013 :  She presented to see Richardson Dopp in February with some increased palpitations and lightheadedness. He recommended that she decrease her caffeine intake. He placed a 21 day event monitor and instructed her to take an extra metoprolol as needed.  The palpitations can occur at any time. They occur with rest and with exertion. They're not constant. The last  30-45 seconds and occur multiple times through the day.  Sept. 16, 2015:  Still having palpitations. She has had more leg edema and her medical doctor on Lasix which he now takes about every other day. She was not on potassium replacement. This may be the cause of her increased PVCs.   May 24, 2014:  Sheila Reynolds is a 73 y.o. female who presents for follow up of her palpitations   Still has these palpitations.  No syncope.   Jan. 10, 2017:  Doing ok Still has DOE  Has palpitations on occasion. Not getting much exercise .  expalined a typical exercise program .   Start slow, gradually increase   May 07, 2016;   Sheila Reynolds is seen today for follow up .  Has been having more palpitations Seem to be getting worse  Went to there ER last week -  the ER note does not describe any specific arrhythmias. No passing out but she did get lightheaded when she had palpitations on one occasion   Has rare episodes of atypical CP .  Lasts a second Not associated with exertion .  Walks 1.5 miles 3-4 times a week .   This walking does not cause any palpitation of CP   July 17, 2016 Sheila Reynolds is seen today for follow up  30 day monitor showed no significant arrhythmias  Having leg pain and swelling  during night and day  Not worsened by exercise  No CP or dyspnea - still walking 1.5 miles 3-4 days a  week   December 22, 2016:  Sheila Reynolds had a cath recently that showed a 70 % LAD stenosis . Has right arm pain but this is not exertional . Has pins and needles sensation Has DOE with stairs and carrying something heavy .  Has not really noticed any difference in how she feels on the new meds which include Imdur 30 mg , Plavix 75 ,  + cough, chronic ,  No hemoptysis. Does not exercise regularly .    January 29, 2017: Sheila Reynolds is seen today for a follow-up visit.  She recently had rotational atherectomy and stenting of her proximal LAD and mid LAD. Still has some pins and needles  sensation Has been walking in the mall.  Does not have CP . Has some DOE with that   .  Jul 09, 2017:   Sheila Reynolds is doing well.  She is status post rotational atherectomy and stent placement to her proximal/mid LAD in December, 2018. Had left eye surgery in left eye.    Occasional CP ,  Thinks it might be indigestion .  Not associated with eating or drinking . Pain was on the left side of left breast.   Lasts a few minutes Did not have much pain prior to her PCI to LAD .  Has been exercising .   Exercise does not cause any CP .  Relieved with SL NTG   BP has been elevated.   Aug. 26, 2019: Following her visit in February, she was having some chest pain.  A stress Myoview study was performed and was found to be low risk.  She has normal left ventricular systolic function and no ischemia. She is feeling quite well.  She has not had any recurrent episodes of chest pain. She is exercising some .   Has been having bilateral leg pain and swelling.  She had an arterial Doppler performed for peripheral vascular disease.  As of today the study has not yet been read.  January 29, 2018: Sheila Reynolds is seen today for 2 reasons.  She is been having more shortness of breath than normal.  She also needs preoperative evaluation.  She has a history of coronary artery disease and is status post stenting of her LAD in December 2018. Is having more DOE with any activity , climbing stairs,  Similar to how she felt prior to stenting Resolves after 5- 10 min of rest.  Has not taken a NTG   Also needs to have eyelid  surgery  Scheduled for Dec. 31.   Oct. 27. 2020  Sheila Reynolds is seen today . Hx of CAD - stenting of her LAD in Dec. 2018   Has some dyspnea.  Constant back pain , may last for hours.  Radiates through to her chest .  Has not tried any NTG. Not relieved with alka seltzer.   Has had some R eye vision issues Was found to have had 2 strokes over the past years. 1 was an older CVA and 1 was  a more recent CVA  Has seen a neurologist her in [place]. Carotid duplex scan revealed mild plaque bilaterally but no significant stenoses. Transcranial Doppler revealed some diffuse plaque but no critical stenoses.   Past Medical History:  Diagnosis Date  . Anemia    3 months ago anemic  . Anxiety    on meds  . Arthritis    "all over" (01/15/2017)  . Asthma   . Bronchitis with emphysema   . Chest pain   .  Chronic bronchitis ([place])   . Coronary artery disease    a. 01/2017 she underwent orbital atherectomy/DES to the proxmal LAD and PTCA to ostial D2. 2D Echo 01/15/17 showed mild LVH, EF 60-65%, grade 1 DD.  . Family history of anesthesia complication    daughter N/V  . Fibromyalgia   . GERD (gastroesophageal reflux disease)    on meds  . Heart murmur   . History of hiatal hernia   . Hx of echocardiogram    Echo (03/2013):  Tech limited; Mild focal basal septal hypertrophy, EF 60-65%, normal RVF  . Hyperlipidemia   . Hypertension   . Nonsustained ventricular tachycardia ([place])   . OSA on CPAP   . Premature atrial contractions   . PVC (premature ventricular contraction)    a. Holter 12/16: NSR, occ PAC,PVCs  . Sarcoidosis   . Type II diabetes mellitus ([place])     Past Surgical History:  Procedure Laterality Date  . BREAST SURGERY    . CARDIAC CATHETERIZATION  05/04/2007   reveals overall normal left ventricular systolic function. Ejection fraction 65-70%  . CATARACT EXTRACTION W/PHACO Right 07/20/2013   Procedure: CATARACT EXTRACTION PHACO AND INTRAOCULAR LENS PLACEMENT (IOC);  Surgeon: Marylynn Pearson, MD;  Location: [facility];  Service: Ophthalmology;  Laterality: Right;  . COLONOSCOPY W/ BIOPSIES AND POLYPECTOMY    . COLONOSCOPY WITH PROPOFOL N/A 09/07/2014   Procedure: COLONOSCOPY WITH PROPOFOL;  Surgeon: Juanita Craver, MD;  Location: [facility];  Service: Endoscopy;  Laterality: N/A;  . CORONARY ANGIOPLASTY WITH STENT PLACEMENT  01/15/2017  . CORONARY ATHERECTOMY N/A  01/15/2017   Procedure: CORONARY ATHERECTOMY;  Surgeon: Nelva Bush, MD;  Location: [facility];  Service: Cardiovascular;  Laterality: N/A;  . CORONARY BALLOON ANGIOPLASTY N/A 01/15/2017   Procedure: CORONARY BALLOON ANGIOPLASTY;  Surgeon: Nelva Bush, MD;  Location: [facility];  Service: Cardiovascular;  Laterality: N/A;  . CORONARY STENT INTERVENTION N/A 01/15/2017   Procedure: CORONARY STENT INTERVENTION;  Surgeon: Nelva Bush, MD;  Location: [facility];  Service: Cardiovascular;  Laterality: N/A;  . ESOPHAGOGASTRODUODENOSCOPY (EGD) WITH PROPOFOL N/A 09/07/2014   Procedure: ESOPHAGOGASTRODUODENOSCOPY (EGD) WITH PROPOFOL;  Surgeon: Juanita Craver, MD;  Location: [facility];  Service: Endoscopy;  Laterality: N/A;  . EXTERNAL EAR SURGERY Bilateral 1970s   tumors removed  . EYE SURGERY Left 2019   cataract extraction   . EYE SURGERY Right 02/09/2018   eyelid surgery   . INTRAVASCULAR PRESSURE WIRE/FFR STUDY N/A 12/12/2016   Procedure: INTRAVASCULAR PRESSURE WIRE/FFR STUDY;  Surgeon: Nelva Bush, MD;  Location: [facility];  Service: Cardiovascular;  Laterality: N/A;  . MINI SHUNT INSERTION Right 07/20/2013   Procedure: INSERTION OF GLAUCOMA FILTRATION DEVICE RIGHT EYE;  Surgeon: Marylynn Pearson, MD;  Location: [facility];  Service: Ophthalmology;  Laterality: Right;  . MITOMYCIN C APPLICATION Right [id number]   Procedure: MITOMYCIN C APPLICATION;  Surgeon: Marylynn Pearson, MD;  Location: [facility];  Service: Ophthalmology;  Laterality: Right;  . MITOMYCIN C APPLICATION Right [id number]   Procedure: MITOMYCIN C APPLICATION RIGHT EYE;  Surgeon: Marylynn Pearson, MD;  Location: [facility];  Service: Ophthalmology;  Laterality: Right;  . PLACEMENT OF BREAST IMPLANTS Bilateral 1992   "took all my breast tissue out; put implants in;fibrocystic breast disease "  . RIGHT/LEFT HEART CATH AND CORONARY ANGIOGRAPHY N/A 12/12/2016   Procedure: RIGHT/LEFT HEART CATH AND CORONARY  ANGIOGRAPHY;  Surgeon: Nelva Bush, MD;  Location: [facility];  Service: Cardiovascular;  Laterality: N/A;  .  TONSILLECTOMY    . TOTAL ABDOMINAL HYSTERECTOMY  1982  . TRABECULECTOMY Right 02/21/2015   Procedure: TRABECULECTOMY WITH [facility] ON THE RIGHT EYE;  Surgeon: Marylynn Pearson, MD;  Location: [facility];  Service: Ophthalmology;  Laterality: Right;     Current Outpatient Medications  Medication Sig Dispense Refill  . acetaminophen (TYLENOL) 500 MG tablet Take 1,000 mg by mouth 2 (two) times daily as needed for moderate pain or headache.    . albuterol (PROVENTIL) (2.5 MG/3ML) 0.083% nebulizer solution Take 3 mLs (2.5 mg total) by nebulization daily as needed for wheezing or shortness of breath. 75 mL 12  . albuterol (VENTOLIN HFA) 108 (90 Base) MCG/ACT inhaler Inhale 2 puffs into the lungs every 6 (six) hours as needed for wheezing or shortness of breath. 1 Inhaler 12  . aspirin EC 81 MG tablet Take 81 mg by mouth at bedtime.     [name] atorvastatin (LIPITOR) 80 MG tablet Take 1 tablet (80 mg total) by mouth daily. 90 tablet 3  . brimonidine (ALPHAGAN P) 0.1 % SOLN Place 1 drop into the left eye 3 (three) times daily.     . brinzolamide (AZOPT) 1 % ophthalmic suspension Place 1 drop into the left eye 3 (three) times daily.     . carvedilol (COREG) 12.5 MG tablet TAKE 1 TABLET(12.5 MG) BY MOUTH TWICE DAILY 180 tablet 3  . clopidogrel (PLAVIX) 75 MG tablet Take 1 tablet (75 mg total) by mouth daily. 90 tablet 3  . furosemide (LASIX) 40 MG tablet Take one tablet (40mg )  Each day for 5 days starting on 09/20/18.... then one tablet EVERY OTHER day thereafter. 48 tablet 3  . guaiFENesin (ROBITUSSIN) 100 MG/5ML SOLN Take 5 mLs by mouth every 6 (six) hours as needed for cough or to loosen phlegm.     . isosorbide mononitrate (IMDUR) 60 MG 24 hr tablet Take 1 tablet (60 mg total) by mouth daily. Please make annual appt in December for future refills. Thank you 90 tablet 0  . JANUMET 50-500 MG tablet  TAKE 1 TABLET BY MOUTH TWICE DAILY WITH A MEAL 180 tablet 1  . Lancets (ONETOUCH DELICA PLUS [id number]) MISC CHECK BLOOD SUGAR BEFORE BREAKFAST AND DINNER 100 each 1  . loteprednol (LOTEMAX) 0.5 % ophthalmic suspension See admin instructions. Instill 8 DROPS IN LEFT EYE 3 IN RIGHT EYE DAILY    . montelukast (SINGULAIR) 10 MG tablet Take 1 tablet (10 mg total) by mouth every evening. 90 tablet 1  . Nebulizers (COMPRESSOR/NEBULIZER) MISC Use as directed 1 each 0  . nitroGLYCERIN (NITROSTAT) 0.4 MG SL tablet Place 1 tablet (0.4 mg total) under the tongue every 5 (five) minutes as needed for chest pain. 25 tablet prn  . olmesartan (BENICAR) 20 MG tablet Take 20 mg by mouth daily.    [name] OVER THE COUNTER MEDICATION     . pantoprazole (PROTONIX) 40 MG tablet TAKE 1 TABLET(40 MG) BY MOUTH DAILY 30 tablet 7  . Polyvinyl Alcohol-Povidone (REFRESH OP) Apply to eye daily.    . potassium chloride SA (K-DUR) 20 MEQ tablet Take 1 tablet (20 mEq total) by mouth daily. Only take when taking Lasix 90 tablet 3  . prednisoLONE acetate (PRED FORTE) 1 % ophthalmic suspension 4 drops in the right eye and 6 drops in the left eye daily    . pregabalin (LYRICA) 75 MG capsule Take 1 capsule (75 mg total) by mouth 2 (two) times daily. 180 capsule 1  . traMADol (ULTRAM) 50 MG tablet  Take 1 tablet (50 mg total) by mouth every 6 (six) hours as needed. 40 tablet 0  . travoprost, benzalkonium, (TRAVATAN) 0.004 % ophthalmic solution Place 1 drop into the right eye at bedtime.      No current facility-administered medications for this visit.     Allergies:   Crestor [rosuvastatin calcium], Demerol  [meperidine hcl], Shellfish allergy, and Demerol [meperidine]    Social History:  The patient  reports that she quit smoking about 38 years ago. Her smoking use included cigarettes. She has a 4.00 pack-year smoking history. She has never used smokeless tobacco. She reports that she does not drink alcohol or use drugs.   Family  History:  The patient's family history includes Cancer in her mother and paternal grandmother; Diabetes in her brother and father; Glaucoma in her brother and father; Heart disease in her father and paternal grandmother.    ROS: Noted in current history, otherwise review of systems is negative.  Physical Exam: Blood pressure 116/70, pulse 75, height 5\' 2"  (1.575 m), weight 174 lb (78.9 kg), SpO2 97 %.  GEN:  Well nourished, well developed in no acute distress HEENT: Normal NECK: No JVD; No carotid bruits LYMPHATICS: No lymphadenopathy CARDIAC: RRR , no murmurs, rubs, gallops RESPIRATORY:  Clear to auscultation without rales, wheezing or rhonchi  ABDOMEN: Soft, non-tender, non-distended MUSCULOSKELETAL:  No edema; No deformity  SKIN: Warm and dry NEUROLOGIC:  Alert and oriented x 3    EKG:    Recent Labs: 03/16/2018: ALT 17; Hemoglobin 11.5; Platelets 214 07/21/2018: BNP 51.1 09/20/2018: NT-Pro BNP 126 10/27/2018: BUN 18; Creatinine, Ser 0.70; Potassium 4.4; Sodium 139    Lipid Panel    Component Value Date/Time   CHOL 130 10/07/2018 1257   TRIG 61 10/07/2018 1257   HDL 47 10/07/2018 1257   CHOLHDL 2.8 10/07/2018 1257   CHOLHDL 3 05/24/2014 1650   VLDL 20.8 05/24/2014 1650   LDLCALC 71 10/07/2018 1257      Wt Readings from Last 3 Encounters:  12/07/18 174 lb (78.9 kg)  11/23/18 170 lb (77.1 kg)  10/20/18 170 lb (77.1 kg)      Other studies Reviewed: Additional studies/ records that were reviewed today include: . Review of the above records demonstrates:    ASSESSMENT AND PLAN:   1.   Coronary artery disease:     .no angina .  Has some atypical cp that does  not sound like angina  Will continue to follow   2. Hyperlipidemia -  Labs are stable   4. Nonsustained ventricular tachycardia-   No recurrent palpitatinos   5.  Sarcoidosis -  Stable,  Followed by pulmonary    Current medicines are reviewed at length with the patient today.  The patient does not have  concerns regarding medicines.  The following changes have been made:  no change  Labs/ tests ordered today include:   Orders Placed This Encounter  Procedures  . EKG 12-Lead     Disposition:      Signed, Mertie Moores, MD  12/07/2018 6:05 PM    [address], [place], [place]  [address] Phone: [phone]; Fax: [phone]

## 2018-12-07 ENCOUNTER — Ambulatory Visit (INDEPENDENT_AMBULATORY_CARE_PROVIDER_SITE_OTHER): Payer: Medicare Other | Admitting: Cardiovascular Disease

## 2018-12-07 ENCOUNTER — Other Ambulatory Visit: Payer: Self-pay

## 2018-12-07 ENCOUNTER — Encounter: Payer: Self-pay | Admitting: Cardiovascular Disease

## 2018-12-07 VITALS — BP 116/70 | HR 75 | Ht 62.0 in | Wt 174.0 lb

## 2018-12-07 DIAGNOSIS — E782 Mixed hyperlipidemia: Secondary | ICD-10-CM | POA: Diagnosis not present

## 2018-12-07 DIAGNOSIS — I6381 Other cerebral infarction due to occlusion or stenosis of small artery: Secondary | ICD-10-CM | POA: Diagnosis not present

## 2018-12-07 DIAGNOSIS — I10 Essential (primary) hypertension: Secondary | ICD-10-CM

## 2018-12-07 DIAGNOSIS — I2510 Atherosclerotic heart disease of native coronary artery without angina pectoris: Secondary | ICD-10-CM | POA: Diagnosis not present

## 2018-12-07 NOTE — Patient Instructions (Signed)
Medication Instructions:  Your physician recommends that you continue on your current medications as directed. Please refer to the Current Medication list given to you today.  *If you need a refill on your cardiac medications before your next appointment, please call your pharmacy*   Lab Work: None Ordered   Testing/Procedures: None Ordered   Follow-Up: At [facility], you and your health needs are our priority.  As part of our continuing mission to provide you with exceptional heart care, we have created designated Provider Care Teams.  These Care Teams include your primary Cardiologist (physician) and Advanced Practice Providers (APPs -  Physician Assistants and Nurse Practitioners) who all work together to provide you with the care you need, when you need it.  Your next appointment:   6 month(s)  The format for your next appointment:   Either In Person or Virtual  Provider:   You may see Philip [name], MD or one of the following Advanced Practice Providers on your designated Care Team:    Scott Weaver, PA-C  Vin Bhagat, PA-C  Janine Hammond, NP    

## 2018-12-08 ENCOUNTER — Other Ambulatory Visit: Payer: Self-pay | Admitting: Cardiovascular Disease

## 2018-12-08 DIAGNOSIS — R262 Difficulty in walking, not elsewhere classified: Secondary | ICD-10-CM | POA: Diagnosis not present

## 2018-12-08 DIAGNOSIS — M545 Low back pain: Secondary | ICD-10-CM | POA: Diagnosis not present

## 2018-12-08 DIAGNOSIS — R531 Weakness: Secondary | ICD-10-CM | POA: Diagnosis not present

## 2018-12-08 MED ORDER — ISOSORBIDE MONONITRATE ER 60 MG PO TB24: 60.0000 mg | ORAL_TABLET | Freq: Every day | ORAL | 3 refills | Status: DC

## 2018-12-09 NOTE — Patient Instructions (Signed)
Visit Information  Goals Addressed            This Visit's Progress     Patient Stated   . I need help getting my medications (pt-stated)       Current Barriers:  . Financial Barriers: patient has Medicare insurance and reports copay for Janumet, lyrica & inhalers is cost prohibitive at this time  Pharmacist Clinical Goal(s):  [name] Over the next 30 days, patient will work with PharmD and providers to relieve medication access concerns  Interventions: . Comprehensive medication review completed; medication list updated in electronic medical record.  . Patient meets income/NO out of pocket spend criteria for this medication's patient assistance program. Reviewed application process. Patient will provide proof of income, out of pocket spend report, and will sign application. Will collaborate with primary care provider, Dr. Glendale Chard, for their portion of application. Once completed, will submit to patient assistance program.  Patient Self Care Activities:  . Patient will provide necessary portions of application   Initial goal documentation     . I would like to optimize my medication management of my chronic conditions (pt-stated)       Current Barriers:  . Diabetes: T2DM; most recent A1c 6.1% on 07/20/18  . Current antihyperglycemic regimen: Janumet 50/500mg  . denies hypoglycemic symptoms; denies hyperglycemic symptoms . Current meal patterns: discussed diet/lifestyle . Current exercise: n/a; encouraged walking as able . Current blood glucose readings: FBG<130, mostly 90-100 (denies hypoglycemia) . Cardiovascular risk reduction: o Current hypertensive regimen: coreg, isosorbide, olmesartan 20mg  (BP reported to be <130/80) o Current hyperlipidemia regimen: atorvastatin 80mg  daily (11/12/18 #90)  Pharmacist Clinical Goal(s):  [name] Over the next 90 days, patient with work with PharmD and primary care provider to address needs related to optimized medication management of chronic  conditions  Interventions: . Comprehensive medication review performed, medication list updated in electronic medical record . Counseled patient on diet/lifestyle  Patient Self Care Activities:  . Patient will check blood glucose 3x weekly in the AM , document, and provide at future appointments . Patient will focus on medication adherence by continuing to take medications as prescribed and apply for financial assistance . Patient will take medications as prescribed . Patient will contact provider with any episodes of hypoglycemia . Patient will report any questions or concerns to provider   Initial goal documentation        The patient verbalized understanding of instructions provided today and declined a print copy of patient instruction materials.   The care management team will reach out to the patient again over the next 4 weeks  SIGNATURE Regina Eck, PharmD, [place] Pharmacist, [place]: [phone]

## 2018-12-09 NOTE — Progress Notes (Signed)
Chronic Care Management   Initial Visit Note  11/30/2018 Name: Sheila Reynolds MRN: [id number] DOB: 15-Aug-1945  Referred by: Glendale Chard, MD Reason for referral : Chronic Care Management   Sheila Reynolds is a 73 y.o. year old female who is a primary care patient of Glendale Chard, MD. The CCM team was consulted for assistance with chronic disease management and care coordination needs related to HTN, HLD and DMII  Review of patient status, including review of consultants reports, relevant laboratory and other test results, and collaboration with appropriate care team members and the patient's provider was performed as part of comprehensive patient evaluation and provision of chronic care management services.    I spoke with [name] by telephone today  Advanced Directives Status: [place] and [employer] application for related entries.   Medications: Outpatient Encounter Medications as of 11/30/2018  Medication Sig  . acetaminophen (TYLENOL) 500 MG tablet Take 1,000 mg by mouth 2 (two) times daily as needed for moderate pain or headache.  . albuterol (PROVENTIL) (2.5 MG/3ML) 0.083% nebulizer solution Take 3 mLs (2.5 mg total) by nebulization daily as needed for wheezing or shortness of breath.  [name] albuterol (VENTOLIN HFA) 108 (90 Base) MCG/ACT inhaler Inhale 2 puffs into the lungs every 6 (six) hours as needed for wheezing or shortness of breath.  [name] aspirin EC 81 MG tablet Take 81 mg by mouth at bedtime.   [name] atorvastatin (LIPITOR) 80 MG tablet Take 1 tablet (80 mg total) by mouth daily.  . brimonidine (ALPHAGAN P) 0.1 % SOLN Place 1 drop into the left eye 3 (three) times daily.   . brinzolamide (AZOPT) 1 % ophthalmic suspension Place 1 drop into the left eye 3 (three) times daily.   . carvedilol (COREG) 12.5 MG tablet TAKE 1 TABLET(12.5 MG) BY MOUTH TWICE DAILY  . clopidogrel (PLAVIX) 75 MG tablet Take 1 tablet (75 mg total) by mouth daily.  . furosemide (LASIX) 40 MG  tablet Take one tablet (40mg )  Each day for 5 days starting on 09/20/18.... then one tablet EVERY OTHER day thereafter.  [name] guaiFENesin (ROBITUSSIN) 100 MG/5ML SOLN Take 5 mLs by mouth every 6 (six) hours as needed for cough or to loosen phlegm.   [name] JANUMET 50-500 MG tablet TAKE 1 TABLET BY MOUTH TWICE DAILY WITH A MEAL  . Lancets (ONETOUCH DELICA PLUS [id number]) MISC CHECK BLOOD SUGAR BEFORE BREAKFAST AND DINNER  . loteprednol (LOTEMAX) 0.5 % ophthalmic suspension See admin instructions. Instill 8 DROPS IN LEFT EYE 3 IN RIGHT EYE DAILY  . Nebulizers (COMPRESSOR/NEBULIZER) MISC Use as directed  . nitroGLYCERIN (NITROSTAT) 0.4 MG SL tablet Place 1 tablet (0.4 mg total) under the tongue every 5 (five) minutes as needed for chest pain.  [name] olmesartan (BENICAR) 20 MG tablet Take 20 mg by mouth daily.  [name] OVER THE COUNTER MEDICATION   . pantoprazole (PROTONIX) 40 MG tablet TAKE 1 TABLET(40 MG) BY MOUTH DAILY  . Polyvinyl Alcohol-Povidone (REFRESH OP) Apply to eye daily.  . potassium chloride SA (K-DUR) 20 MEQ tablet Take 1 tablet (20 mEq total) by mouth daily. Only take when taking Lasix  . prednisoLONE acetate (PRED FORTE) 1 % ophthalmic suspension 4 drops in the right eye and 6 drops in the left eye daily  . pregabalin (LYRICA) 75 MG capsule Take 1 capsule (75 mg total) by mouth 2 (two) times daily.  . traMADol (ULTRAM) 50 MG tablet Take 1 tablet (50 mg total) by mouth every 6 (six)  hours as needed.  . travoprost, benzalkonium, (TRAVATAN) 0.004 % ophthalmic solution Place 1 drop into the right eye at bedtime.   . [DISCONTINUED] isosorbide mononitrate (IMDUR) 60 MG 24 hr tablet Take 1 tablet (60 mg total) by mouth daily. Please make annual appt in December for future refills. Thank you  . [DISCONTINUED] montelukast (SINGULAIR) 10 MG tablet TAKE 1 TABLET BY MOUTH EVERY EVENING   No facility-administered encounter medications on file as of 11/30/2018.      Objective:   Goals Addressed             This Visit's Progress     Patient Stated   . I need help getting my medications (pt-stated)       Current Barriers:  . Financial Barriers: patient has Medicare insurance and reports copay for Janumet, lyrica & inhalers is cost prohibitive at this time  Pharmacist Clinical Goal(s):  [name] Over the next 30 days, patient will work with PharmD and providers to relieve medication access concerns  Interventions: . Comprehensive medication review completed; medication list updated in electronic medical record.  . Patient meets income/NO out of pocket spend criteria for this medication's patient assistance program. Reviewed application process. Patient will provide proof of income, out of pocket spend report, and will sign application. Will collaborate with primary care provider, Dr. Glendale Chard, for their portion of application. Once completed, will submit to patient assistance program.  Patient Self Care Activities:  . Patient will provide necessary portions of application   Initial goal documentation     . I would like to optimize my medication management of my chronic conditions (pt-stated)       Current Barriers:  . Diabetes: T2DM; most recent A1c 6.1% on 07/20/18  . Current antihyperglycemic regimen: Janumet 50/500mg  . denies hypoglycemic symptoms; denies hyperglycemic symptoms . Current meal patterns: discussed diet/lifestyle . Current exercise: n/a; encouraged walking as able . Current blood glucose readings: FBG<130, mostly 90-100 (denies hypoglycemia) . Cardiovascular risk reduction: o Current hypertensive regimen: coreg, isosorbide, olmesartan 20mg  (BP reported to be <130/80) o Current hyperlipidemia regimen: atorvastatin 80mg  daily (11/12/18 #90)  Pharmacist Clinical Goal(s):  [name] Over the next 90 days, patient with work with PharmD and primary care provider to address needs related to optimized medication management of chronic conditions  Interventions: . Comprehensive medication  review performed, medication list updated in electronic medical record . Counseled patient on diet/lifestyle  Patient Self Care Activities:  . Patient will check blood glucose 3x weekly in the AM , document, and provide at future appointments . Patient will focus on medication adherence by continuing to take medications as prescribed and apply for financial assistance . Patient will take medications as prescribed . Patient will contact provider with any episodes of hypoglycemia . Patient will report any questions or concerns to provider   Initial goal documentation           Plan:   The care management team will reach out to the patient again over the next 4 weeks  Provider Signature  Regina Eck, PharmD, [place] Pharmacist, [facility] [place]: [phone]

## 2018-12-10 DIAGNOSIS — R531 Weakness: Secondary | ICD-10-CM | POA: Diagnosis not present

## 2018-12-10 DIAGNOSIS — M545 Low back pain: Secondary | ICD-10-CM | POA: Diagnosis not present

## 2018-12-10 DIAGNOSIS — R262 Difficulty in walking, not elsewhere classified: Secondary | ICD-10-CM | POA: Diagnosis not present

## 2018-12-13 DIAGNOSIS — M545 Low back pain: Secondary | ICD-10-CM | POA: Diagnosis not present

## 2018-12-13 DIAGNOSIS — R531 Weakness: Secondary | ICD-10-CM | POA: Diagnosis not present

## 2018-12-13 DIAGNOSIS — R262 Difficulty in walking, not elsewhere classified: Secondary | ICD-10-CM | POA: Diagnosis not present

## 2018-12-15 ENCOUNTER — Telehealth: Payer: Self-pay

## 2018-12-15 DIAGNOSIS — A609 Anogenital herpesviral infection, unspecified: Secondary | ICD-10-CM | POA: Diagnosis not present

## 2018-12-15 DIAGNOSIS — Z01419 Encounter for gynecological examination (general) (routine) without abnormal findings: Secondary | ICD-10-CM | POA: Diagnosis not present

## 2018-12-15 DIAGNOSIS — R21 Rash and other nonspecific skin eruption: Secondary | ICD-10-CM | POA: Diagnosis not present

## 2018-12-15 DIAGNOSIS — R61 Generalized hyperhidrosis: Secondary | ICD-10-CM | POA: Diagnosis not present

## 2018-12-15 DIAGNOSIS — Z6832 Body mass index (BMI) 32.0-32.9, adult: Secondary | ICD-10-CM | POA: Diagnosis not present

## 2018-12-15 DIAGNOSIS — N898 Other specified noninflammatory disorders of vagina: Secondary | ICD-10-CM | POA: Diagnosis not present

## 2018-12-15 DIAGNOSIS — N951 Menopausal and female climacteric states: Secondary | ICD-10-CM | POA: Diagnosis not present

## 2018-12-15 DIAGNOSIS — R262 Difficulty in walking, not elsewhere classified: Secondary | ICD-10-CM | POA: Diagnosis not present

## 2018-12-15 DIAGNOSIS — L282 Other prurigo: Secondary | ICD-10-CM | POA: Diagnosis not present

## 2018-12-15 DIAGNOSIS — R531 Weakness: Secondary | ICD-10-CM | POA: Diagnosis not present

## 2018-12-15 DIAGNOSIS — M545 Low back pain: Secondary | ICD-10-CM | POA: Diagnosis not present

## 2018-12-17 ENCOUNTER — Other Ambulatory Visit: Payer: Self-pay | Admitting: Cardiovascular Disease

## 2018-12-21 DIAGNOSIS — R262 Difficulty in walking, not elsewhere classified: Secondary | ICD-10-CM | POA: Diagnosis not present

## 2018-12-21 DIAGNOSIS — M545 Low back pain: Secondary | ICD-10-CM | POA: Diagnosis not present

## 2018-12-21 DIAGNOSIS — R531 Weakness: Secondary | ICD-10-CM | POA: Diagnosis not present

## 2018-12-23 ENCOUNTER — Encounter: Payer: Self-pay | Admitting: Internal Medicine

## 2018-12-23 DIAGNOSIS — M545 Low back pain: Secondary | ICD-10-CM | POA: Diagnosis not present

## 2018-12-23 DIAGNOSIS — R531 Weakness: Secondary | ICD-10-CM | POA: Diagnosis not present

## 2018-12-23 DIAGNOSIS — Z803 Family history of malignant neoplasm of breast: Secondary | ICD-10-CM | POA: Diagnosis not present

## 2018-12-23 DIAGNOSIS — R262 Difficulty in walking, not elsewhere classified: Secondary | ICD-10-CM | POA: Diagnosis not present

## 2018-12-23 DIAGNOSIS — N644 Mastodynia: Secondary | ICD-10-CM | POA: Diagnosis not present

## 2018-12-23 LAB — HM MAMMOGRAPHY
HM Mammogram: ABNORMAL — AB (ref 0–4)
HM Mammogram: ABNORMAL — AB (ref 0–4)

## 2018-12-27 DIAGNOSIS — M545 Low back pain: Secondary | ICD-10-CM | POA: Diagnosis not present

## 2018-12-27 DIAGNOSIS — R531 Weakness: Secondary | ICD-10-CM | POA: Diagnosis not present

## 2018-12-27 DIAGNOSIS — R262 Difficulty in walking, not elsewhere classified: Secondary | ICD-10-CM | POA: Diagnosis not present

## 2018-12-28 ENCOUNTER — Encounter: Payer: Self-pay | Admitting: Internal Medicine

## 2018-12-29 DIAGNOSIS — R262 Difficulty in walking, not elsewhere classified: Secondary | ICD-10-CM | POA: Diagnosis not present

## 2018-12-29 DIAGNOSIS — M545 Low back pain: Secondary | ICD-10-CM | POA: Diagnosis not present

## 2018-12-29 DIAGNOSIS — R531 Weakness: Secondary | ICD-10-CM | POA: Diagnosis not present

## 2018-12-30 ENCOUNTER — Other Ambulatory Visit: Payer: Self-pay

## 2018-12-30 ENCOUNTER — Ambulatory Visit (INDEPENDENT_AMBULATORY_CARE_PROVIDER_SITE_OTHER): Payer: Medicare Other

## 2018-12-30 ENCOUNTER — Ambulatory Visit (INDEPENDENT_AMBULATORY_CARE_PROVIDER_SITE_OTHER): Payer: Medicare Other | Admitting: Internal Medicine

## 2018-12-30 VITALS — BP 122/72 | HR 71 | Temp 97.1°F | Ht 62.0 in | Wt 171.8 lb

## 2018-12-30 DIAGNOSIS — G4733 Obstructive sleep apnea (adult) (pediatric): Secondary | ICD-10-CM | POA: Diagnosis not present

## 2018-12-30 DIAGNOSIS — D869 Sarcoidosis, unspecified: Secondary | ICD-10-CM | POA: Diagnosis not present

## 2018-12-30 DIAGNOSIS — R0789 Other chest pain: Secondary | ICD-10-CM | POA: Diagnosis not present

## 2018-12-30 DIAGNOSIS — I6381 Other cerebral infarction due to occlusion or stenosis of small artery: Secondary | ICD-10-CM | POA: Diagnosis not present

## 2018-12-30 NOTE — Patient Instructions (Signed)
Order- CXR   Dx Sarcoid  Order- lab-  Angiotensin Converting Enzyme       Dx Sarcoid  Order- print script-     Second dose of Shingles Vaccine  I would suggest you discuss the chest/ back pain with your primary doctor and see if she wants you to discus it with your neurologist. Let them know it is worse when you are standing.   Please call if you can help

## 2018-12-30 NOTE — Progress Notes (Signed)
HPI  F former smoker followed for bronchitis, hx  Occular sarcoid, bronchitis/ nodules, OSA complicated by GERD, glaucoma, DM2 NPSG 12/25/03- AHI  2.9/ hr, desaturation to 91%, body weight 164 lbs NPSG-10/18/13- Mild OSA, AHI 9.3/ hr, weight 164 lbs  ACE 09/17/15-47-WNL ACE level 08/18/16-30 PFT: 01/07/2011-FEV1 1.59/79%, FEV1/FVC 0.73, FEF 25-75% improved to 38% with bronchodilator. TLC 90% DLCO 55% Office Spirometry 06/09/16- moderate restriction of exhaled volume. FVC 1.51/68%, FEV1 1.20/70%, ratio 0.79, FEF 25-75% 1.19/73% Nuclear Stress Test 09/04/16- EF 65%, low risk. ECHO 07/31/16- Gr 1 DD Walk test on room air 12/21/2017-minimal saturation 94%, maximum heart rate 118/minute with no stops walking 3 laps x180 m. Office Spirometry 12/21/17 ---------------------------------------------------------------------------------------------------------------   06/28/2018- 73 yo female former smoker followed for COPD/bronchiolitis/lung nodules, history of ocular sarcoid/ MTX, OSA, complicated by GERD, Glaucoma, DM 2, CAD/ stent, DDD lumbar spine CPAP 9/Advanced- no download ------Pt states she has constant pain 10/10 on her L mid-back and across to lower breast area for the last 2 mo; has tried Tylenol, hemp cream, heat patches without resolve Doesn't remember any acute event, trauma or associated rash. Known degenerative disk disease.   12/30/2018- 73 yo female former smoker followed for COPD/bronchiolitis/lung nodules, history of ocular sarcoid/ MTX, OSA, complicated by GERD, Glaucoma, DM 2, CAD/ stent, DDD lumbar spine CPAP 9/Adapt Download compliance 100%, AHI 0.9/hr Body weight today 171 lbs Previously described pain in back and chest getting worse, esp with standing, not with deep breath or eating. Relieved by bending and stretching. Recent mamogram ok. 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  ROS-see HPI     + = positive Constitutional:   No-   weight loss, night sweats, fevers, chills, fatigue, lassitude. HEENT:   +  headaches, difficulty swallowing, tooth/dental problems, sore throat,       Some  sneezing, itching, ear ache, +nasal congestion, post nasal drip,  CV: + chest pain, no-orthopnea, PND, swelling in lower extremities, anasarca, dizziness, palpitations Resp: +  shortness of breath with exertion not at rest.            productive cough,  + non-productive cough,  No- coughing up of blood.               in color of mucus.   wheezing.   Skin: No-   rash or lesions. GI:  +  heartburn, indigestion, abdominal pain, nausea, vomiting,  GU:  MS:  +  joint pain or swelling, + back pain Neuro-     nothing unusual Psych:  No- change in mood or affect. No depression or anxiety.  No memory loss.  OBJ  Afebrile General- Alert, Oriented, Affect tearful, Distress- none acute, + overweight Skin- rash-none, lesions- none, excoriation- none Lymphadenopathy- none Head- atraumatic            Eyes- Gross vision intact, PERRLA, conjunctivae clear secretions- not injected            Ears- Hearing aid             Nose- + turbinate edema, no-Septal dev, mucus, polyps, erosion, perforation             Throat- Mallampati III , mucosa  , drainage- none, tonsils- atrophic Neck- flexible , trachea midline, no stridor , thyroid nl, carotid no bruit Chest - symmetrical excursion , unlabored  Heart/CV- RRR , no murmur , no gallop  , no rub, nl s1 s2                           - JVD- none , edema- none, stasis changes- none, varices- none           Lung-  Cough- none , wheeze-none, rhonchi-none , dullness-none, rub- none           Chest wall-  Abd- No HSM Br/ Gen/ Rectal- Not done, not indicated Extrem- cyanosis- none, clubbing, none, atrophy- none, strength- nl.  Neuro- grossly intact to observation

## 2018-12-31 ENCOUNTER — Telehealth: Payer: Self-pay

## 2018-12-31 LAB — ANGIOTENSIN CONVERTING ENZYME: Angiotensin-Converting Enzyme: 41 U/L (ref 9–67)

## 2019-01-01 ENCOUNTER — Other Ambulatory Visit: Payer: Self-pay

## 2019-01-01 ENCOUNTER — Encounter (HOSPITAL_COMMUNITY): Payer: Self-pay | Admitting: *Deleted

## 2019-01-01 ENCOUNTER — Emergency Department (HOSPITAL_COMMUNITY)
Admission: EM | Admit: 2019-01-01 | Discharge: 2019-01-01 | Disposition: A | Discharge status: Home / Self Care | Payer: Medicare Other | Attending: Emergency Medicine | Admitting: Emergency Medicine

## 2019-01-01 DIAGNOSIS — Z7982 Long term (current) use of aspirin: Secondary | ICD-10-CM | POA: Diagnosis not present

## 2019-01-01 DIAGNOSIS — I10 Essential (primary) hypertension: Secondary | ICD-10-CM | POA: Diagnosis not present

## 2019-01-01 DIAGNOSIS — R52 Pain, unspecified: Secondary | ICD-10-CM | POA: Diagnosis not present

## 2019-01-01 DIAGNOSIS — E119 Type 2 diabetes mellitus without complications: Secondary | ICD-10-CM | POA: Diagnosis not present

## 2019-01-01 DIAGNOSIS — I2510 Atherosclerotic heart disease of native coronary artery without angina pectoris: Secondary | ICD-10-CM | POA: Insufficient documentation

## 2019-01-01 DIAGNOSIS — Z20828 Contact with and (suspected) exposure to other viral communicable diseases: Secondary | ICD-10-CM | POA: Insufficient documentation

## 2019-01-01 DIAGNOSIS — R42 Dizziness and giddiness: Secondary | ICD-10-CM | POA: Diagnosis not present

## 2019-01-01 DIAGNOSIS — Z87891 Personal history of nicotine dependence: Secondary | ICD-10-CM | POA: Diagnosis not present

## 2019-01-01 DIAGNOSIS — Z7984 Long term (current) use of oral hypoglycemic drugs: Secondary | ICD-10-CM | POA: Insufficient documentation

## 2019-01-01 DIAGNOSIS — R0902 Hypoxemia: Secondary | ICD-10-CM | POA: Diagnosis not present

## 2019-01-01 DIAGNOSIS — R110 Nausea: Secondary | ICD-10-CM | POA: Diagnosis not present

## 2019-01-01 DIAGNOSIS — J45909 Unspecified asthma, uncomplicated: Secondary | ICD-10-CM | POA: Diagnosis not present

## 2019-01-01 DIAGNOSIS — R197 Diarrhea, unspecified: Secondary | ICD-10-CM | POA: Diagnosis not present

## 2019-01-01 DIAGNOSIS — A09 Infectious gastroenteritis and colitis, unspecified: Secondary | ICD-10-CM | POA: Diagnosis not present

## 2019-01-01 DIAGNOSIS — Z79899 Other long term (current) drug therapy: Secondary | ICD-10-CM | POA: Insufficient documentation

## 2019-01-01 DIAGNOSIS — J029 Acute pharyngitis, unspecified: Secondary | ICD-10-CM | POA: Diagnosis not present

## 2019-01-01 DIAGNOSIS — R1110 Vomiting, unspecified: Secondary | ICD-10-CM | POA: Diagnosis not present

## 2019-01-01 LAB — COMPREHENSIVE METABOLIC PANEL
ALT: 27 U/L (ref 0–44)
AST: 27 U/L (ref 15–41)
Albumin: 3.9 g/dL (ref 3.5–5.0)
Alkaline Phosphatase: 94 U/L (ref 38–126)
Anion gap: 7 (ref 5–15)
BUN: 7 mg/dL — ABNORMAL LOW (ref 8–23)
CO2: 26 mmol/L (ref 22–32)
Calcium: 8.8 mg/dL — ABNORMAL LOW (ref 8.9–10.3)
Chloride: 106 mmol/L (ref 98–111)
Creatinine, Ser: 0.82 mg/dL (ref 0.44–1.00)
GFR calc Af Amer: >60 mL/min (ref >60)
GFR calc non Af Amer: >60 mL/min (ref >60)
Glucose, Bld: 92 mg/dL (ref 70–99)
Potassium: 4 mmol/L (ref 3.5–5.1)
Sodium: 139 mmol/L (ref 135–145)
Total Bilirubin: <0.1 mg/dL — ABNORMAL LOW (ref 0.3–1.2)
Total Protein: 6.7 g/dL (ref 6.5–8.1)

## 2019-01-01 LAB — CBC WITH DIFFERENTIAL/PLATELET
Abs Immature Granulocytes: 0.04 10*3/uL (ref 0.00–0.07)
Basophils Absolute: 0 10*3/uL (ref 0.0–0.1)
Basophils Relative: 0 %
Eosinophils Absolute: 0.2 10*3/uL (ref 0.0–0.5)
Eosinophils Relative: 2 %
HCT: 38.4 % (ref 36.0–46.0)
Hemoglobin: 12.2 g/dL (ref 12.0–15.0)
Immature Granulocytes: 1 %
Lymphocytes Relative: 18 %
Lymphs Abs: 1.5 10*3/uL (ref 0.7–4.0)
MCH: 27.8 pg (ref 26.0–34.0)
MCHC: 31.8 g/dL (ref 30.0–36.0)
MCV: 87.5 fL (ref 80.0–100.0)
Monocytes Absolute: 0.5 10*3/uL (ref 0.1–1.0)
Monocytes Relative: 6 %
Neutro Abs: 6.1 10*3/uL (ref 1.7–7.7)
Neutrophils Relative %: 73 %
Platelets: 217 10*3/uL (ref 150–400)
RBC: 4.39 MIL/uL (ref 3.87–5.11)
RDW: 14 % (ref 11.5–15.5)
WBC: 8.3 10*3/uL (ref 4.0–10.5)
nRBC: 0 % (ref 0.0–0.2)

## 2019-01-01 LAB — URINALYSIS, COMPLETE (UACMP) WITH MICROSCOPIC
Bilirubin Urine: NEGATIVE
Glucose, UA: NEGATIVE mg/dL
Hgb urine dipstick: NEGATIVE
Ketones, ur: NEGATIVE mg/dL
Leukocytes,Ua: NEGATIVE
Nitrite: NEGATIVE
Protein, ur: NEGATIVE mg/dL
Specific Gravity, Urine: 1.009 (ref 1.005–1.030)
pH: 6 (ref 5.0–8.0)

## 2019-01-01 LAB — LIPASE, BLOOD: Lipase: 34 U/L (ref 11–51)

## 2019-01-01 LAB — MAGNESIUM: Magnesium: 1.7 mg/dL (ref 1.7–2.4)

## 2019-01-01 LAB — SARS CORONAVIRUS 2 (TAT 6-24 HRS): SARS Coronavirus 2: NEGATIVE

## 2019-01-01 MED ORDER — SODIUM CHLORIDE 0.9 % IV BOLUS
1000.0000 mL | Freq: Once | INTRAVENOUS | Status: AC
  Administered 2019-01-01: 1000 mL via INTRAVENOUS

## 2019-01-01 MED ORDER — ONDANSETRON HCL 4 MG/2ML IJ SOLN
4.0000 mg | Freq: Once | INTRAMUSCULAR | Status: AC
  Administered 2019-01-01: 4 mg via INTRAVENOUS
  Filled 2019-01-01: qty 2

## 2019-01-01 MED ORDER — PROMETHAZINE HCL 25 MG PO TABS: 25.0000 mg | ORAL_TABLET | Freq: Four times a day (QID) | ORAL | 0 refills | Status: DC | PRN

## 2019-01-01 NOTE — ED Triage Notes (Addendum)
Patient presents to ed via [facility] states she started having diarrhea yest am. C/o dizziness this am with nausea no vomiting. C/o funny feeling in head. Alert oriented. Patient states she has  Had at least 10-12 liquid stools.

## 2019-01-01 NOTE — Discharge Instructions (Addendum)
You were seen in the ER for diarrhea.  Your work-up today was normal.  I suspect her symptoms are from either a virus or from recent food intake like milk.  We tested you for COVID-19, the results are pending.  You can check on your results on MyChart.  Until you obtain your results you should isolate at home and be cautious.  Stay hydrated, drink plenty of clear fluids.  Gentle diet and avoid things that will make diarrhea worse like foods, dairy.  Can use as needed Imodium.  I have given you prescription for promethazine for nausea.

## 2019-01-01 NOTE — ED Provider Notes (Signed)
[facility] EMERGENCY DEPARTMENT Provider Note   CSN: [id number] Arrival date & time: 01/01/19  1425     History   Chief Complaint Chief Complaint  Patient presents with   Diarrhea    HPI Sheila Reynolds is a 73 y.o. female with pertinent past medical history of CAD s/p stents on Plavix, PVCs, HTN, GERD, diabetes, mental hernia presents ER for evaluation of diarrhea.  Onset yesterday at 7:30 AM.  Described as loose, no blood or melena.  Had mild lower abdominal cramping yesterday but this is better today.  Associated with nausea and right sided mild sore throat since yesterday.  This morning she was in the shower when she started to feel "funny" describes feeling lightheaded, clammy.  She was able to get out of shower, had large BM,  walked downstairs.  She sat on the kitchen counter when she had sudden onset feeling of lightheadedness, feeling like she was in a cloud, clammy, hot, nausea.  Her mouth got dry.  Her whole body got numb and feet were burning.  Walked a short distance to a nearby chair and table and made approximately 1 called for help.  She felt like a "dish rag". No CP, SOB, palpitations during this episode. Her husband called EMS.  Before EMS got there, she had another urge to have a bowel movement, her husband helped her walk up/down stairs to use the bathroom.  EMS assisted her walking downstairs.  She was told her a CBG was 168 which is also than intended. She has had 3-4 BM so far today.  Yesterday she had more than 10.  Chronic cough for 15 years unchanged.  Patient has been going to Dr. And PT appointments.  Her grandchildren visit her.  No sick contacts at home.  No travel.  No recent antibiotics.  On Thursday night, the night before symptoms began, she had 2 brownies and a large cup of milk.  She thinks this is what made her sick because she usually does not drink milk.  Known abdominal hernia unchanged, bulges out with movement.  History of  hysterectomy.  No other abdominal surgeries.  No interventions.  No fever, vomiting, dysuria, congestion.     HPI  Past Medical History:  Diagnosis Date   Anemia    3 months ago anemic   Anxiety    on meds   Arthritis    "all over" (01/15/2017)   Asthma    Bronchitis with emphysema    Chest pain    Chronic bronchitis ([place])    Coronary artery disease    a. 01/2017 she underwent orbital atherectomy/DES to the proxmal LAD and PTCA to ostial D2. 2D Echo 01/15/17 showed mild LVH, EF 60-65%, grade 1 DD.   Family history of anesthesia complication    daughter N/V   Fibromyalgia    GERD (gastroesophageal reflux disease)    on meds   Heart murmur    History of hiatal hernia    Hx of echocardiogram    Echo (03/2013):  Tech limited; Mild focal basal septal hypertrophy, EF 60-65%, normal RVF   Hyperlipidemia    Hypertension    Nonsustained ventricular tachycardia (HCC)    OSA on CPAP    Premature atrial contractions    PVC (premature ventricular contraction)    a. 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/2017   History of hypercholesterolemia 06/01/2017   History of anxiety 06/01/2017   History of IBS 06/01/2017   Premature atrial contractions 03/30/2017   PVC's (premature ventricular contractions) 03/30/2017   Diabetes mellitus without complication ([place]) [id number]   Angina pectoris ([place]) 01/15/2017   CAD (coronary artery disease) 01/12/2017   Shortness of breath 12/12/2016   Acute maxillary sinusitis 05/03/2014   Obstructive sleep apnea 11/04/2013   Multiple lung nodules 07/18/2012   Heart palpitations 03/11/2012   HTN (hypertension) 03/11/2012   GERD (gastroesophageal reflux disease)  07/13/2011   Sarcoidosis 02/08/2011   COPD with acute exacerbation ([place]) 02/05/2011   Hyperlipemia 09/24/2010   Atypical chest pain 09/24/2010   NSVT (nonsustained ventricular tachycardia) ([place]) 09/24/2010   Leg edema 09/24/2010    Past Surgical History:  Procedure Laterality Date   BREAST SURGERY     CARDIAC CATHETERIZATION  05/04/2007   reveals overall normal left ventricular systolic function. Ejection fraction 65-70%   CATARACT EXTRACTION W/PHACO Right 07/20/2013   Procedure: CATARACT EXTRACTION PHACO AND INTRAOCULAR LENS PLACEMENT (IOC);  Surgeon: Marylynn Pearson, MD;  Location: [facility];  Service: Ophthalmology;  Laterality: Right;   COLONOSCOPY W/ BIOPSIES AND POLYPECTOMY     COLONOSCOPY WITH PROPOFOL N/A 09/07/2014   Procedure: COLONOSCOPY WITH PROPOFOL;  Surgeon: Juanita Craver, MD;  Location: [facility];  Service: Endoscopy;  Laterality: N/A;   CORONARY ANGIOPLASTY WITH STENT PLACEMENT  01/15/2017   CORONARY ATHERECTOMY N/A 01/15/2017   Procedure: CORONARY ATHERECTOMY;  Surgeon: Nelva Bush, MD;  Location: [place] [facility];  Service: Cardiovascular;  Laterality: N/A;   CORONARY BALLOON ANGIOPLASTY N/A 01/15/2017   Procedure: CORONARY BALLOON ANGIOPLASTY;  Surgeon: Nelva Bush, MD;  Location: [facility];  Service: Cardiovascular;  Laterality: N/A;   CORONARY STENT INTERVENTION N/A 01/15/2017   Procedure: CORONARY STENT INTERVENTION;  Surgeon: Nelva Bush, MD;  Location: [place];  Service: Cardiovascular;  Laterality: N/A;   ESOPHAGOGASTRODUODENOSCOPY (EGD) WITH PROPOFOL N/A 09/07/2014   Procedure: ESOPHAGOGASTRODUODENOSCOPY (EGD) WITH PROPOFOL;  Surgeon: Juanita Craver, MD;  Location: [facility];  Service: Endoscopy;  Laterality: N/A;   EXTERNAL EAR SURGERY Bilateral 1970s   tumors removed   EYE SURGERY Left 2019   cataract extraction    EYE SURGERY Right 02/09/2018   eyelid surgery    INTRAVASCULAR PRESSURE WIRE/FFR STUDY N/A  12/12/2016   Procedure: INTRAVASCULAR PRESSURE WIRE/FFR STUDY;  Surgeon: Nelva Bush, MD;  Location: [place] [facility];  Service: Cardiovascular;  Laterality: N/A;   MINI SHUNT INSERTION Right 07/20/2013   Procedure: INSERTION OF GLAUCOMA FILTRATION DEVICE RIGHT EYE;  Surgeon: Marylynn Pearson, MD;  Location: [facility];  Service: Ophthalmology;  Laterality: Right;   MITOMYCIN C APPLICATION Right [id number]   Procedure: MITOMYCIN C APPLICATION;  Surgeon: Marylynn Pearson, MD;  Location: [facility];  Service: Ophthalmology;  Laterality: Right;   MITOMYCIN C APPLICATION Right [id number]   Procedure: MITOMYCIN C APPLICATION RIGHT EYE;  Surgeon: Marylynn Pearson, MD;  Location: [place];  Service: Ophthalmology;  Laterality: Right;   PLACEMENT OF BREAST IMPLANTS Bilateral 1992   "took all my breast tissue out; put implants in;fibrocystic breast disease "   RIGHT/LEFT HEART CATH AND CORONARY ANGIOGRAPHY N/A 12/12/2016   Procedure: RIGHT/LEFT HEART CATH AND CORONARY ANGIOGRAPHY;  Surgeon: Nelva Bush, MD;  Location: [place] CV LAB;  Service: Cardiovascular;  Laterality: N/A;   TONSILLECTOMY  TOTAL ABDOMINAL HYSTERECTOMY  1982   TRABECULECTOMY Right 02/21/2015   Procedure: TRABECULECTOMY WITH [facility] ON THE RIGHT EYE;  Surgeon: Marylynn Pearson, MD;  Location: [place];  Service: Ophthalmology;  Laterality: Right;     OB History    Gravida  2   Para  2   Term      Preterm      AB      Living  2     SAB      TAB      Ectopic      Multiple      Live Births               Home Medications    Prior to Admission medications   Medication Sig Start Date End Date Taking? Authorizing Provider  acetaminophen (TYLENOL) 500 MG tablet Take 1,000 mg by mouth 2 (two) times daily as needed for moderate pain or headache.    [provider]  albuterol (PROVENTIL) (2.5 MG/3ML) 0.083% nebulizer solution Take 3 mLs (2.5 mg total) by nebulization daily as needed for wheezing or shortness of breath.  04/22/18   Deneise Lever, MD  albuterol (VENTOLIN HFA) 108 (90 Base) MCG/ACT inhaler Inhale 2 puffs into the lungs every 6 (six) hours as needed for wheezing or shortness of breath. 06/28/18   Baird Lyons D, MD  aspirin EC 81 MG tablet Take 81 mg by mouth at bedtime.     [provider]  atorvastatin (LIPITOR) 80 MG tablet Take 1 tablet (80 mg total) by mouth daily. 11/12/18 11/07/19  [name], Wonda Cheng, MD  brimonidine (ALPHAGAN P) 0.1 % SOLN Place 1 drop into the left eye 3 (three) times daily.     [provider]  brinzolamide (AZOPT) 1 % ophthalmic suspension Place 1 drop into the left eye 3 (three) times daily.     [provider]  carvedilol (COREG) 12.5 MG tablet TAKE 1 TABLET(12.5 MG) BY MOUTH TWICE DAILY 11/08/18   [name], Wonda Cheng, MD  clopidogrel (PLAVIX) 75 MG tablet Take 1 tablet (75 mg total) by mouth daily. 10/19/18   Daune Perch, NP  furosemide (LASIX) 40 MG tablet Take one tablet (40mg )  Each day for 5 days starting on 09/20/18.... then one tablet EVERY OTHER day thereafter. 09/22/18   Daune Perch, NP  guaiFENesin (ROBITUSSIN) 100 MG/5ML SOLN Take 5 mLs by mouth every 6 (six) hours as needed for cough or to loosen phlegm.     [provider]  isosorbide mononitrate (IMDUR) 60 MG 24 hr tablet Take 1 tablet (60 mg total) by mouth daily. 12/08/18   [name], Wonda Cheng, MD  JANUMET 50-500 MG tablet TAKE 1 TABLET BY MOUTH TWICE DAILY WITH A MEAL 09/08/18   Glendale Chard, MD  Lancets (ONETOUCH DELICA PLUS [id number]) [place] AND [facility]INNER 05/28/18   Glendale Chard, MD  loteprednol (LOTEMAX) 0.5 % ophthalmic suspension See admin instructions. Instill 8 DROPS IN LEFT EYE 3 IN RIGHT EYE DAILY    [provider]  montelukast (SINGULAIR) 10 MG tablet Take 1 tablet (10 mg total) by mouth every evening. 12/06/18   Glendale Chard, MD  Nebulizers (COMPRESSOR/NEBULIZER) MISC Use as directed 04/22/18   Deneise Lever, MD    nitroGLYCERIN (NITROSTAT) 0.4 MG SL tablet Place 1 tablet (0.4 mg total) under the tongue every 5 (five) minutes as needed for chest pain. Patient not taking: Reported on 12/30/2018 12/12/16   End, Harrell Gave, MD  olmesartan (  BENICAR) 20 MG tablet Take 20 mg by mouth daily.    [provider]  OVER THE COUNTER MEDICATION     [provider]  pantoprazole (PROTONIX) 40 MG tablet TAKE 1 TABLET(40 MG) BY MOUTH DAILY 06/16/18   Bhagat, [place], PA  Polyvinyl Alcohol-Povidone (REFRESH OP) Apply to eye daily.    [provider]  potassium chloride SA (K-DUR) 20 MEQ tablet Take 1 tablet (20 mEq total) by mouth daily. Only take when taking Lasix 10/19/18   Daune Perch, NP  prednisoLONE acetate (PRED FORTE) 1 % ophthalmic suspension 4 drops in the right eye and 6 drops in the left eye daily 09/24/18   [provider]  pregabalin (LYRICA) 75 MG capsule Take 1 capsule (75 mg total) by mouth 2 (two) times daily. 10/25/18   Glendale Chard, MD  promethazine (PHENERGAN) 25 MG tablet Take 1 tablet (25 mg total) by mouth every 6 (six) hours as needed for nausea or vomiting. 01/01/19   Kinnie Feil, PA-C  traMADol (ULTRAM) 50 MG tablet Take 1 tablet (50 mg total) by mouth every 6 (six) hours as needed. 03/18/18   Glendale Chard, MD  travoprost, benzalkonium, (TRAVATAN) 0.004 % ophthalmic solution Place 1 drop into the right eye at bedtime.     [provider]    Family History Family History  Problem Relation Age of Onset   Heart disease Father    Diabetes Father    Glaucoma Father    Cancer Mother        unknown type, ?lung   Heart disease Paternal Grandmother    Cancer Paternal Grandmother        unknown type   Diabetes Brother    Glaucoma Brother     Social History Social History   Tobacco Use   Smoking status: Former Smoker    Packs/day: 1.00    Years: 4.00    Pack years: 4.00    Types: Cigarettes    Quit date: 02/11/1980    Years  since quitting: 38.9   Smokeless tobacco: Never Used  Substance Use Topics   Alcohol use: No   Drug use: No     Allergies   Crestor [rosuvastatin calcium], Demerol  [meperidine hcl], Shellfish allergy, and Demerol [meperidine]   Review of Systems Review of Systems  Constitutional: Positive for diaphoresis.  Gastrointestinal: Positive for abdominal pain (resolved), diarrhea and nausea.  Neurological: Positive for light-headedness.  All other systems reviewed and are negative.    Physical Exam Updated Vital Signs BP 125/67 (BP Location: Right Arm)    Pulse 77    Temp 98.2 F (36.8 C) (Oral)    Resp 18    Ht 5\' 2"  (1.575 m)    Wt 77.1 kg    SpO2 98%    BMI 31.09 kg/m   Physical Exam Vitals signs and nursing note reviewed.  Constitutional:      Appearance: She is well-developed.     Comments: Non toxic in NAD  HENT:     Head: Normocephalic and atraumatic.     Nose: Nose normal.     Mouth/Throat:     Comments: Dry lips but MMM. Mild erythema throughout oropharynx and soft palate R>L.  Tonsils without exudate, hypertrophy.  Uvula midline.  Tolerating secretions.  Normal sublingual space, tongue protrusion, phonation. Eyes:     Conjunctiva/sclera: Conjunctivae normal.  Neck:     Musculoskeletal: Normal range of motion.     Comments: No cervical lymphadenopathy.  No anterior/lateral  neck edema or tenderness. Cardiovascular:     Rate and Rhythm: Normal rate and regular rhythm.  Pulmonary:     Effort: Pulmonary effort is normal.     Breath sounds: Normal breath sounds.  Abdominal:     General: Bowel sounds are normal.     Palpations: Abdomen is soft.     Tenderness: There is no abdominal tenderness.     Hernia: A hernia is present.     Comments: Mild periumbilical/left mid abdominal tenderness, "sore".  Ventral hernia bulges out when sitting up but easily reducible with pressure and laying flat.  No G/R/R. No suprapubic or CVA tenderness. Negative Murphy's and McBurney's.  Active BS to lower quadrants.   Musculoskeletal: Normal range of motion.  Skin:    General: Skin is warm and dry.     Capillary Refill: Capillary refill takes less than 2 seconds.  Neurological:     Mental Status: She is alert.  Psychiatric:        Behavior: Behavior normal.      ED Treatments / Results  Labs (all labs ordered are listed, but only abnormal results are displayed) Labs Reviewed  COMPREHENSIVE METABOLIC PANEL - Abnormal; Notable for the following components:      Result Value   BUN 7 (*)    Calcium 8.8 (*)    Total Bilirubin <0.1 (*)    All other components within normal limits  URINALYSIS, COMPLETE (UACMP) WITH MICROSCOPIC - Abnormal; Notable for the following components:   APPearance HAZY (*)    Bacteria, UA RARE (*)    All other components within normal limits  SARS CORONAVIRUS 2 (TAT 6-24 HRS)  CBC WITH DIFFERENTIAL/PLATELET  LIPASE, BLOOD  MAGNESIUM    EKG EKG Interpretation  Date/Time:  Saturday January 01 2019 14:33:19 EST Ventricular Rate:  73 PR Interval:    QRS Duration: 77 QT Interval:  395 QTC Calculation: 436 R Axis:   25 Text Interpretation: Sinus rhythm No acute changes Confirmed by Varney Biles [id number]) on 01/01/2019 6:08:01 PM   Radiology No results found.  Procedures Procedures (including critical care time)  Medications Ordered in ED Medications  ondansetron (ZOFRAN) injection 4 mg (4 mg Intravenous Given 01/01/19 1603)  sodium chloride 0.9 % bolus 1,000 mL (0 mLs Intravenous Stopped 01/01/19 1800)     Initial Impression / Assessment and Plan / ED Course  I have reviewed the triage vital signs and the nursing notes.  Pertinent labs & imaging results that were available during my care of the patient were reviewed by me and considered in my medical decision making (see chart for details).  Clinical Course as of Jan 01 1911  Sat Jan 01, 2019  1843 Pt re-evaluated. Feels a lot better. No emesis. No diarrhea. No abd  tenderness on repeat exam.   [CG]    Clinical Course User Index [CG] Kinnie Feil, PA-C    EMR reviewed to obtain pertinent PMH.   Highest on ddx is viral gastroenteritis specially because associated sore throat. She has no fever, vomiting, significant abdominal pain, blood in stool and I considered diverticulitis or bacterial diarrhea less likely.  No recent travel or antibiotics.    Screening labs, UA, IVF, antiemetics ordered. She has no abd pain here will defer pain meds.  ER work up including labs and UA reviewed and interpreted by me, vastly reassuring. No leukocytosis or electrolyte abnormalities. UA normal without ketones.  EKG obtained given report of lightheadedness, no ischemia or arrhythmias or prolonged intervals.  Patient reevaluated and had significant improvement in overall malaise.  Tolerating p.o.  No emesis or BMs here.  Repeat abdominal exam benign.  Highest suspicion for viral gastroenteritis, she drank milk which could have also caused diarrhea.  Given benign work-up, repeat benign abdominal exam and clinical improvement I do not think emergent CT is indicated.  Discussed this with patient who is comfortable with this plan.  Will DC with symptomatic management including oral hydration, antiemetic, gentle diet.  Return precautions discussed with patient and husband who agree with treatment plan.  Final Clinical Impressions(s) / ED Diagnoses   Final diagnoses:  Diarrhea of presumed infectious origin    ED Discharge Orders         Ordered    promethazine (PHENERGAN) 25 MG tablet  Every 6 hours PRN     01/01/19 1909           Kinnie Feil, PA-C 01/01/19 1912    Varney Biles, MD 01/02/19 [phone]

## 2019-01-03 ENCOUNTER — Encounter: Payer: Self-pay | Admitting: Internal Medicine

## 2019-01-03 ENCOUNTER — Other Ambulatory Visit: Payer: Self-pay

## 2019-01-03 ENCOUNTER — Telehealth (INDEPENDENT_AMBULATORY_CARE_PROVIDER_SITE_OTHER): Payer: Medicare Other | Admitting: Internal Medicine

## 2019-01-03 VITALS — Ht 62.0 in

## 2019-01-03 DIAGNOSIS — R197 Diarrhea, unspecified: Secondary | ICD-10-CM

## 2019-01-03 DIAGNOSIS — H9201 Otalgia, right ear: Secondary | ICD-10-CM

## 2019-01-03 DIAGNOSIS — R0982 Postnasal drip: Secondary | ICD-10-CM

## 2019-01-03 DIAGNOSIS — J029 Acute pharyngitis, unspecified: Secondary | ICD-10-CM | POA: Diagnosis not present

## 2019-01-03 NOTE — Patient Instructions (Signed)

## 2019-01-03 NOTE — Progress Notes (Signed)
Virtual Visit via Phone   This visit type was conducted due to national recommendations for restrictions regarding the COVID-19 Pandemic (e.g. social distancing) in an effort to limit this patient's exposure and mitigate transmission in our community.  Due to her co-morbid illnesses, this patient is at least at moderate risk for complications without adequate follow up.  This format is felt to be most appropriate for this patient at this time.  All issues noted in this document were discussed and addressed.  A limited physical exam was performed with this format.    This visit type was conducted due to national recommendations for restrictions regarding the COVID-19 Pandemic (e.g. social distancing) in an effort to limit this patient's exposure and mitigate transmission in our community.  Patients identity confirmed using two different identifiers.  This format is felt to be most appropriate for this patient at this time.  All issues noted in this document were discussed and addressed.  No physical exam was performed (except for noted visual exam findings with Video Visits).    Date:  01/03/2019   ID:  Sheila Reynolds, DOB Jun 23, 1945, MRN [id number]  Patient Location:  Home  Provider location:   Office    Chief Complaint:  "I have ear pain. I went to ER and they said I may need an antibiotic"  History of Present Illness:    Sheila Reynolds is a 73 y.o. female who presents via video conferencing for a telehealth visit today.    The patient does not have symptoms concerning for COVID-19 infection (fever, chills, cough, or new shortness of breath).   She presents today for virtual visit. She prefers this method of contact due to COVID-19 pandemic. She presents for ER f/u. She presented to ER on 11/21 for further evaluation of ear pain, diarrhea. She was advised that she may need abx. She was not given rx abx at discharge. She denies fever/chills. She has had some improvement in her  sx.   Otalgia  There is pain in the right ear. This is a new problem. The current episode started in the past 7 days. The problem has been gradually improving. There has been no fever. The pain is at a severity of 6/10. The pain is moderate. Associated symptoms include diarrhea. She has tried acetaminophen for the symptoms. The treatment provided mild relief. There is no history of a chronic ear infection.  Sore Throat  This is a new problem. The current episode started in the past 7 days. The problem has been gradually improving. There has been no fever. Associated symptoms include diarrhea and ear pain. She has had no exposure to strep or mono.     Past Medical History:  Diagnosis Date  . Anemia    3 months ago anemic  . Anxiety    on meds  . Arthritis    "all over" (01/15/2017)  . Asthma   . Bronchitis with emphysema   . Chest pain   . Chronic bronchitis ([place])   . Coronary artery disease    a. 01/2017 she underwent orbital atherectomy/DES to the proxmal LAD and PTCA to ostial D2. 2D Echo 01/15/17 showed mild LVH, EF 60-65%, grade 1 DD.  . Family history of anesthesia complication    daughter N/V  . Fibromyalgia   . GERD (gastroesophageal reflux disease)    on meds  . Heart murmur   . History of hiatal hernia   . Hx of echocardiogram    Echo (03/2013):  Tech limited; Mild focal basal septal hypertrophy, EF 60-65%, normal RVF  . Hyperlipidemia   . Hypertension   . Nonsustained ventricular tachycardia ([place])   . OSA on CPAP   . Premature atrial contractions   . PVC (premature ventricular contraction)    a. Holter 12/16: NSR, occ PAC,PVCs  . Sarcoidosis   . Type II diabetes mellitus ([place])    Past Surgical History:  Procedure Laterality Date  . BREAST SURGERY    . CARDIAC CATHETERIZATION  05/04/2007   reveals overall normal left ventricular systolic function. Ejection fraction 65-70%  . CATARACT EXTRACTION W/PHACO Right 07/20/2013   Procedure: CATARACT EXTRACTION PHACO AND  INTRAOCULAR LENS PLACEMENT (IOC);  Surgeon: Marylynn Pearson, MD;  Location: [facility];  Service: Ophthalmology;  Laterality: Right;  . COLONOSCOPY W/ BIOPSIES AND POLYPECTOMY    . COLONOSCOPY WITH PROPOFOL N/A 09/07/2014   Procedure: COLONOSCOPY WITH PROPOFOL;  Surgeon: Juanita Craver, MD;  Location: [facility];  Service: Endoscopy;  Laterality: N/A;  . CORONARY ANGIOPLASTY WITH STENT PLACEMENT  01/15/2017  . CORONARY ATHERECTOMY N/A 01/15/2017   Procedure: CORONARY ATHERECTOMY;  Surgeon: Nelva Bush, MD;  Location: [facility];  Service: Cardiovascular;  Laterality: N/A;  . CORONARY BALLOON ANGIOPLASTY N/A 01/15/2017   Procedure: CORONARY BALLOON ANGIOPLASTY;  Surgeon: Nelva Bush, MD;  Location: [facility];  Service: Cardiovascular;  Laterality: N/A;  . CORONARY STENT INTERVENTION N/A 01/15/2017   Procedure: CORONARY STENT INTERVENTION;  Surgeon: Nelva Bush, MD;  Location: [facility];  Service: Cardiovascular;  Laterality: N/A;  . ESOPHAGOGASTRODUODENOSCOPY (EGD) WITH PROPOFOL N/A 09/07/2014   Procedure: ESOPHAGOGASTRODUODENOSCOPY (EGD) WITH PROPOFOL;  Surgeon: Juanita Craver, MD;  Location: [facility];  Service: Endoscopy;  Laterality: N/A;  . EXTERNAL EAR SURGERY Bilateral 1970s   tumors removed  . EYE SURGERY Left 2019   cataract extraction   . EYE SURGERY Right 02/09/2018   eyelid surgery   . INTRAVASCULAR PRESSURE WIRE/FFR STUDY N/A 12/12/2016   Procedure: INTRAVASCULAR PRESSURE WIRE/FFR STUDY;  Surgeon: Nelva Bush, MD;  Location: [place];  Service: Cardiovascular;  Laterality: N/A;  . MINI SHUNT INSERTION Right 07/20/2013   Procedure: INSERTION OF GLAUCOMA FILTRATION DEVICE RIGHT EYE;  Surgeon: Marylynn Pearson, MD;  Location: [facility];  Service: Ophthalmology;  Laterality: Right;  . MITOMYCIN C APPLICATION Right [id number]   Procedure: MITOMYCIN C APPLICATION;  Surgeon: Marylynn Pearson, MD;  Location: [facility];  Service: Ophthalmology;  Laterality: Right;   . MITOMYCIN C APPLICATION Right [id number]   Procedure: MITOMYCIN C APPLICATION RIGHT EYE;  Surgeon: Marylynn Pearson, MD;  Location: [place];  Service: Ophthalmology;  Laterality: Right;  . PLACEMENT OF BREAST IMPLANTS Bilateral 1992   "took all my breast tissue out; put implants in;fibrocystic breast disease "  . RIGHT/LEFT HEART CATH AND CORONARY ANGIOGRAPHY N/A 12/12/2016   Procedure: RIGHT/LEFT HEART CATH AND CORONARY ANGIOGRAPHY;  Surgeon: Nelva Bush, MD;  Location: [place] [facility];  Service: Cardiovascular;  Laterality: N/A;  . TONSILLECTOMY    . TOTAL ABDOMINAL HYSTERECTOMY  1982  . TRABECULECTOMY Right 02/21/2015   Procedure: TRABECULECTOMY WITH [facility] ON THE RIGHT EYE;  Surgeon: Marylynn Pearson, MD;  Location: [facility];  Service: Ophthalmology;  Laterality: Right;     Current Meds  Medication Sig  . acetaminophen (TYLENOL) 500 MG tablet Take 1,000 mg by mouth 2 (two) times daily as needed for moderate pain or headache.  . albuterol (PROVENTIL) (2.5 MG/3ML) 0.083% nebulizer solution Take 3 mLs (2.5 mg total) by  nebulization daily as needed for wheezing or shortness of breath.  [name] albuterol (VENTOLIN HFA) 108 (90 Base) MCG/ACT inhaler Inhale 2 puffs into the lungs every 6 (six) hours as needed for wheezing or shortness of breath.  [name] atorvastatin (LIPITOR) 80 MG tablet Take 1 tablet (80 mg total) by mouth daily.  . brimonidine (ALPHAGAN P) 0.1 % SOLN Place 1 drop into the left eye 3 (three) times daily.   . brinzolamide (AZOPT) 1 % ophthalmic suspension Place 1 drop into the left eye 3 (three) times daily.   . carvedilol (COREG) 12.5 MG tablet TAKE 1 TABLET(12.5 MG) BY MOUTH TWICE DAILY  . clopidogrel (PLAVIX) 75 MG tablet Take 1 tablet (75 mg total) by mouth daily.  . furosemide (LASIX) 40 MG tablet Take one tablet (40mg )  Each day for 5 days starting on 09/20/18.... then one tablet EVERY OTHER day thereafter.  [name] guaiFENesin (ROBITUSSIN) 100 MG/5ML SOLN Take 5 mLs by mouth every 6 (six) hours as  needed for cough or to loosen phlegm.   . isosorbide mononitrate (IMDUR) 60 MG 24 hr tablet Take 1 tablet (60 mg total) by mouth daily.  [name] JANUMET 50-500 MG tablet TAKE 1 TABLET BY MOUTH TWICE DAILY WITH A MEAL  . Lancets (ONETOUCH DELICA PLUS [id number]) MISC CHECK BLOOD SUGAR BEFORE BREAKFAST AND DINNER  . loteprednol (LOTEMAX) 0.5 % ophthalmic suspension See admin instructions. Instill 8 DROPS IN LEFT EYE 3 IN RIGHT EYE DAILY  . montelukast (SINGULAIR) 10 MG tablet Take 1 tablet (10 mg total) by mouth every evening.  . Nebulizers (COMPRESSOR/NEBULIZER) MISC Use as directed  . nitroGLYCERIN (NITROSTAT) 0.4 MG SL tablet Place 1 tablet (0.4 mg total) under the tongue every 5 (five) minutes as needed for chest pain.  [name] olmesartan (BENICAR) 20 MG tablet Take 20 mg by mouth daily.  [name] OVER THE COUNTER MEDICATION   . pantoprazole (PROTONIX) 40 MG tablet TAKE 1 TABLET(40 MG) BY MOUTH DAILY  . Polyvinyl Alcohol-Povidone (REFRESH OP) Apply to eye daily.  . potassium chloride SA (K-DUR) 20 MEQ tablet Take 1 tablet (20 mEq total) by mouth daily. Only take when taking Lasix  . prednisoLONE acetate (PRED FORTE) 1 % ophthalmic suspension 4 drops in the right eye and 6 drops in the left eye daily  . pregabalin (LYRICA) 75 MG capsule Take 1 capsule (75 mg total) by mouth 2 (two) times daily.  . promethazine (PHENERGAN) 25 MG tablet Take 1 tablet (25 mg total) by mouth every 6 (six) hours as needed for nausea or vomiting.  . traMADol (ULTRAM) 50 MG tablet Take 1 tablet (50 mg total) by mouth every 6 (six) hours as needed.  . travoprost, benzalkonium, (TRAVATAN) 0.004 % ophthalmic solution Place 1 drop into the right eye at bedtime.      Allergies:   Crestor [rosuvastatin calcium], Demerol  [meperidine hcl], Shellfish allergy, and Demerol [meperidine]   Social History   Tobacco Use  . Smoking status: Former Smoker    Packs/day: 1.00    Years: 4.00    Pack years: 4.00    Types: Cigarettes    Quit date:  02/11/1980    Years since quitting: 38.9  . Smokeless tobacco: Never Used  Substance Use Topics  . Alcohol use: No  . Drug use: No     Family Hx: The patient's family history includes Cancer in her mother and paternal grandmother; Diabetes in her brother and father; Glaucoma in her brother and father; Heart disease in her father and  paternal grandmother.  ROS:   Please see the history of present illness.    Review of Systems  Constitutional: Negative.   HENT: Positive for ear pain.   Respiratory: Negative.   Cardiovascular: Negative.   Gastrointestinal: Positive for diarrhea.  Neurological: Negative.   Psychiatric/Behavioral: Negative.     All other systems reviewed and are negative.   Labs/Other Tests and Data Reviewed:    Recent Labs: 07/21/2018: BNP 51.1 09/20/2018: NT-Pro BNP 126 01/01/2019: ALT 27; BUN 7; Creatinine, Ser 0.82; Hemoglobin 12.2; Magnesium 1.7; Platelets 217; Potassium 4.0; Sodium 139   Recent Lipid Panel Lab Results  Component Value Date/Time   CHOL 130 10/07/2018 12:57 PM   TRIG 61 10/07/2018 12:57 PM   HDL 47 10/07/2018 12:57 PM   CHOLHDL 2.8 10/07/2018 12:57 PM   CHOLHDL 3 05/24/2014 04:50 PM   LDLCALC 71 10/07/2018 12:57 PM    Wt Readings from Last 3 Encounters:  01/01/19 170 lb (77.1 kg)  12/30/18 171 lb 12.8 oz (77.9 kg)  12/07/18 174 lb (78.9 kg)     Exam:    Vital Signs:  Ht 5\' 2"  (1.575 m)   BMI 31.09 kg/m     Physical Exam  Pulmonary/Chest: Effort normal.  She is able to speak in full sentences. PE not performed since this is phone visit.   ASSESSMENT & PLAN:    1. Right ear pain  Pt advised that without fever/chills I do not think abx are indicated at this time. She is encouraged to let me know if her sx persist. Advised that she should schedule in-person appt should her sx worsen.   2. Postnasal drip  She will resume use of cetirizine nightly. She has used this in the past with success.   3. Diarrhea, unspecified type   She is encouraged to follow a bland diet. She had neg COVID test at her ER visit. She is encouraged to stay well hydrated. She will let me know if her sx persist past another 48-72 hours.       COVID-19 Education: The signs and symptoms of COVID-19 were discussed with the patient and how to seek care for testing (follow up with PCP or arrange E-visit).  The importance of social distancing was discussed today.  Patient Risk:   After full review of this patients clinical status, I feel that they are at least moderate risk at this time.  Time:   Today, I have spent 11 minutes/ seconds with the patient with telehealth technology discussing above diagnoses.     Medication Adjustments/Labs and Tests Ordered: Current medicines are reviewed at length with the patient today.  Concerns regarding medicines are outlined above.   Tests Ordered: No orders of the defined types were placed in this encounter.   Medication Changes: No orders of the defined types were placed in this encounter.   Disposition:  Follow up prn  Signed, Maximino Greenland, MD

## 2019-01-04 ENCOUNTER — Telehealth: Payer: Self-pay

## 2019-01-04 NOTE — Telephone Encounter (Signed)
The pt was asked how she is doing today and the pt said that she still has diarrhea and that it's watery, she still has a sore throat but she doesn't have a fever.

## 2019-01-04 NOTE — Telephone Encounter (Signed)
Please advise to eat mashed/baked potatoes to help improve diarrhea. Also needs to stay well hydrated.

## 2019-01-04 NOTE — Telephone Encounter (Signed)
The pt was notified that Dr. Baird Cancer advised the pt to eat plain mashed/baked potatoes and to stay hydrated because the pt has diarrhea.

## 2019-01-05 ENCOUNTER — Telehealth: Payer: Self-pay

## 2019-01-11 DIAGNOSIS — R531 Weakness: Secondary | ICD-10-CM | POA: Diagnosis not present

## 2019-01-11 DIAGNOSIS — M545 Low back pain: Secondary | ICD-10-CM | POA: Diagnosis not present

## 2019-01-11 DIAGNOSIS — R262 Difficulty in walking, not elsewhere classified: Secondary | ICD-10-CM | POA: Diagnosis not present

## 2019-01-12 DIAGNOSIS — H3581 Retinal edema: Secondary | ICD-10-CM | POA: Diagnosis not present

## 2019-01-12 DIAGNOSIS — H20013 Primary iridocyclitis, bilateral: Secondary | ICD-10-CM | POA: Diagnosis not present

## 2019-01-12 DIAGNOSIS — H35373 Puckering of macula, bilateral: Secondary | ICD-10-CM | POA: Diagnosis not present

## 2019-01-12 LAB — HM DIABETES EYE EXAM

## 2019-01-13 DIAGNOSIS — R531 Weakness: Secondary | ICD-10-CM | POA: Diagnosis not present

## 2019-01-13 DIAGNOSIS — M545 Low back pain: Secondary | ICD-10-CM | POA: Diagnosis not present

## 2019-01-13 DIAGNOSIS — R262 Difficulty in walking, not elsewhere classified: Secondary | ICD-10-CM | POA: Diagnosis not present

## 2019-01-18 ENCOUNTER — Telehealth: Payer: Self-pay

## 2019-01-18 DIAGNOSIS — R531 Weakness: Secondary | ICD-10-CM | POA: Diagnosis not present

## 2019-01-18 DIAGNOSIS — M545 Low back pain: Secondary | ICD-10-CM | POA: Diagnosis not present

## 2019-01-18 DIAGNOSIS — R262 Difficulty in walking, not elsewhere classified: Secondary | ICD-10-CM | POA: Diagnosis not present

## 2019-01-18 NOTE — Telephone Encounter (Signed)
The pt wanted to know if she should get tested for the coronavirus because her granddaughter was having  symptoms and had to get tested.  The pt was told that she doesn't need to be tested for the coronavirus if she isn't having symptoms.

## 2019-01-19 ENCOUNTER — Encounter: Payer: Self-pay | Admitting: Internal Medicine

## 2019-01-23 ENCOUNTER — Emergency Department (HOSPITAL_COMMUNITY)
Admission: EM | Admit: 2019-01-23 | Discharge: 2019-01-24 | Disposition: A | Discharge status: Home / Self Care | Payer: Medicare Other | Attending: Emergency Medicine | Admitting: Emergency Medicine

## 2019-01-23 ENCOUNTER — Emergency Department (HOSPITAL_COMMUNITY): Payer: Medicare Other

## 2019-01-23 ENCOUNTER — Other Ambulatory Visit: Payer: Self-pay

## 2019-01-23 ENCOUNTER — Encounter (HOSPITAL_COMMUNITY): Payer: Self-pay | Admitting: *Deleted

## 2019-01-23 DIAGNOSIS — Z7901 Long term (current) use of anticoagulants: Secondary | ICD-10-CM | POA: Diagnosis not present

## 2019-01-23 DIAGNOSIS — R0602 Shortness of breath: Secondary | ICD-10-CM | POA: Diagnosis not present

## 2019-01-23 DIAGNOSIS — Z79899 Other long term (current) drug therapy: Secondary | ICD-10-CM | POA: Insufficient documentation

## 2019-01-23 DIAGNOSIS — E119 Type 2 diabetes mellitus without complications: Secondary | ICD-10-CM | POA: Insufficient documentation

## 2019-01-23 DIAGNOSIS — I10 Essential (primary) hypertension: Secondary | ICD-10-CM | POA: Diagnosis not present

## 2019-01-23 DIAGNOSIS — R002 Palpitations: Secondary | ICD-10-CM | POA: Diagnosis not present

## 2019-01-23 DIAGNOSIS — I2510 Atherosclerotic heart disease of native coronary artery without angina pectoris: Secondary | ICD-10-CM | POA: Insufficient documentation

## 2019-01-23 DIAGNOSIS — R0789 Other chest pain: Secondary | ICD-10-CM | POA: Diagnosis present

## 2019-01-23 LAB — BASIC METABOLIC PANEL
Anion gap: 10 (ref 5–15)
BUN: 12 mg/dL (ref 8–23)
CO2: 25 mmol/L (ref 22–32)
Calcium: 9.4 mg/dL (ref 8.9–10.3)
Chloride: 106 mmol/L (ref 98–111)
Creatinine, Ser: 0.83 mg/dL (ref 0.44–1.00)
GFR calc Af Amer: >60 mL/min (ref >60)
GFR calc non Af Amer: >60 mL/min (ref >60)
Glucose, Bld: 117 mg/dL — ABNORMAL HIGH (ref 70–99)
Potassium: 3.4 mmol/L — ABNORMAL LOW (ref 3.5–5.1)
Sodium: 141 mmol/L (ref 135–145)

## 2019-01-23 LAB — CBC
HCT: 35 % — ABNORMAL LOW (ref 36.0–46.0)
Hemoglobin: 11.1 g/dL — ABNORMAL LOW (ref 12.0–15.0)
MCH: 27.6 pg (ref 26.0–34.0)
MCHC: 31.7 g/dL (ref 30.0–36.0)
MCV: 87.1 fL (ref 80.0–100.0)
Platelets: 197 10*3/uL (ref 150–400)
RBC: 4.02 MIL/uL (ref 3.87–5.11)
RDW: 14.1 % (ref 11.5–15.5)
WBC: 7.7 10*3/uL (ref 4.0–10.5)
nRBC: 0 % (ref 0.0–0.2)

## 2019-01-23 LAB — TROPONIN I (HIGH SENSITIVITY): Troponin I (High Sensitivity): 3 ng/L (ref <18)

## 2019-01-23 MED ORDER — SODIUM CHLORIDE 0.9% FLUSH: 3.0000 mL | Freq: Once | INTRAVENOUS | Status: DC

## 2019-01-23 NOTE — ED Triage Notes (Signed)
The pt is  C/o chest pain with an irregular heart rate with sob and some lt arm pain since 1800  She took 324mg  of aspirin  This evening when the pain started  Hx mi and stents in coronary arteries

## 2019-01-23 NOTE — Assessment & Plan Note (Signed)
CT of thoracic spine 06/30/2018 shows some progression of upper lobe nodularity.  Plan ACE level

## 2019-01-23 NOTE — Assessment & Plan Note (Signed)
Continues to benefit with good compliance and control but needs mask refit Plan- [employer] refit mask, continue CPAP 9

## 2019-01-23 NOTE — Assessment & Plan Note (Signed)
Now describing back and chest pain worse with standing, which sounds like a spinal stenosis pattern despite negative CT of T-spine. She may need to take with PCP about referral to N-surgery, possible MRI.

## 2019-01-24 ENCOUNTER — Telehealth: Payer: Self-pay | Admitting: Nurse Practitioner

## 2019-01-24 DIAGNOSIS — R0602 Shortness of breath: Secondary | ICD-10-CM

## 2019-01-24 DIAGNOSIS — R002 Palpitations: Secondary | ICD-10-CM | POA: Diagnosis not present

## 2019-01-24 DIAGNOSIS — M7989 Other specified soft tissue disorders: Secondary | ICD-10-CM

## 2019-01-24 LAB — TROPONIN I (HIGH SENSITIVITY): Troponin I (High Sensitivity): 4 ng/L (ref <18)

## 2019-01-24 MED ORDER — ALUM & MAG HYDROXIDE-SIMETH 200-200-20 MG/5ML PO SUSP
30.0000 mL | Freq: Once | ORAL | Status: AC
  Administered 2019-01-24: 30 mL via ORAL
  Filled 2019-01-24: qty 30

## 2019-01-24 MED ORDER — POTASSIUM CHLORIDE CRYS ER 20 MEQ PO TBCR: 20.0000 meq | EXTENDED_RELEASE_TABLET | Freq: Two times a day (BID) | ORAL | 3 refills | Status: DC

## 2019-01-24 NOTE — Telephone Encounter (Signed)
Received call from patient who states she went to the ED yesterday with terrible palpitations. States she was told to call and follow-up with Dr. Acie Fredrickson. Reports she recently took furosemide 40 mg two days in a row due to lower extremity swelling. States she took potassium at least one of the two days but may have missed a dose. States she was told her lab work and ecg were normal at the hospital. I advised that her K+ level was low and that this could be contributing to her palpitations. Reports she has had recent swelling and SOB, has not been weighing daily. She denies high sodium diet. I advised that I will review with Dr. Acie Fredrickson and call her back with his advice. She verbalized understanding and agreement with plan.

## 2019-01-24 NOTE — Telephone Encounter (Signed)
Reviewed Dr. Elmarie Shiley advice with patient who verbalized understanding. I asked her to call back to let us know if the increased furosemide and kdur helps her swelling and/or dyspnea.  I advised her that one of our schedulers will call her to schedule her echo. She verbalized understanding and agreement with plan of care and thanked me for the call.

## 2019-01-24 NOTE — Discharge Instructions (Signed)
It seems unlikely you are having a heart attack based on your work up here.  Please follow up with your cardiologist for your palpitations.

## 2019-01-24 NOTE — Telephone Encounter (Signed)
Her palpitations may be related to her hypokalemia Have her take Kdur 20 BID on the days she takes the Lasix Since she is having some increased swelling, have her take the Lasix 40 QD  and Kdur  20 mew BID for the next 3 days Lets repeat her echo If the palpitations contiue, lets place an event monitor .    The monitor earlier this year revealed on ly PVCs

## 2019-01-24 NOTE — ED Notes (Signed)
Patient verbalizes understanding of discharge instructions. Opportunity for questioning and answers were provided. Armband removed by staff, pt discharged from ED. Pt. ambulatory and discharged home.  

## 2019-01-24 NOTE — ED Provider Notes (Signed)
[facility] EMERGENCY DEPARTMENT Provider Note   CSN: [id number] Arrival date & time: 01/23/19  2209     History Chief Complaint  Patient presents with  . Chest Pain    Sheila Reynolds is a 73 y.o. female.  73 yo F with a chief complaint of palpitations.  Going on since this evening.  Seems to come and go.  Nothing seems to make it better or worse.  When it happens she has the discomfort goes from her chest to her left axilla.  Has some mild shortness of breath with it.  She took 4 baby aspirin's at home.  Called her daughter-in-law who is the former cardiac nurse and told her to come to the ED for evaluation.  She also took a fluid pill thinking she may have more fluid on her than normal.  She denies any increased swelling to her legs.  The history is provided by the patient.  Chest Pain Pain location:  L chest Pain quality: aching and sharp   Radiates to: L axilla. Pain severity:  Mild Onset quality:  Gradual Duration:  6 hours Timing:  Intermittent Progression:  Waxing and waning Chronicity:  New Relieved by:  Nothing Worsened by:  Nothing Ineffective treatments:  None tried Associated symptoms: shortness of breath   Associated symptoms: no dizziness, no fever, no headache, no nausea, no palpitations and no vomiting        Past Medical History:  Diagnosis Date  . Anemia    3 months ago anemic  . Anxiety    on meds  . Arthritis    "all over" (01/15/2017)  . Asthma   . Bronchitis with emphysema   . Chest pain   . Chronic bronchitis ([place])   . Coronary artery disease    a. 01/2017 she underwent orbital atherectomy/DES to the proxmal LAD and PTCA to ostial D2. 2D Echo 01/15/17 showed mild LVH, EF 60-65%, grade 1 DD.  . Family history of anesthesia complication    daughter N/V  . Fibromyalgia   . GERD (gastroesophageal reflux disease)    on meds  . Heart murmur   . History of hiatal hernia   . Hx of echocardiogram    Echo (03/2013):  Tech  limited; Mild focal basal septal hypertrophy, EF 60-65%, normal RVF  . Hyperlipidemia   . Hypertension   . Nonsustained ventricular tachycardia ([place])   . OSA on CPAP   . Premature atrial contractions   . PVC (premature ventricular contraction)    a. Holter 12/16: NSR, occ PAC,PVCs  . Sarcoidosis   . Type II diabetes mellitus [facility])     Patient Active Problem List   Diagnosis Date Noted  . 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 Fibromyalgia 06/01/2017  . DDD (degenerative disc disease), lumbar 06/01/2017  . 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 History of hypercholesterolemia 06/01/2017  . History of anxiety 06/01/2017  . History of IBS 06/01/2017  . Premature atrial contractions 03/30/2017  . PVC's (premature ventricular contractions) 03/30/2017  . Diabetes mellitus without complication ([place]) [id number]  . Angina pectoris ([place]) 01/15/2017  . CAD (coronary artery disease) 01/12/2017  . Shortness of breath 12/12/2016  . Acute maxillary sinusitis 05/03/2014  . Obstructive sleep apnea 11/04/2013  . Multiple lung nodules 07/18/2012  . Heart palpitations 03/11/2012  . HTN (hypertension) 03/11/2012  . GERD (gastroesophageal reflux disease) 07/13/2011  . Sarcoidosis 02/08/2011  . COPD with acute exacerbation ([place]) 02/05/2011  . Hyperlipemia 09/24/2010  . Atypical chest  pain 09/24/2010  . NSVT (nonsustained ventricular tachycardia) ([place]) 09/24/2010  . Leg edema 09/24/2010    Past Surgical History:  Procedure Laterality Date  . BREAST SURGERY    . CARDIAC CATHETERIZATION  05/04/2007   reveals overall normal left ventricular systolic function. Ejection fraction 65-70%  . CATARACT EXTRACTION W/PHACO Right 07/20/2013   Procedure: CATARACT EXTRACTION PHACO AND INTRAOCULAR LENS PLACEMENT (IOC);  Surgeon: Marylynn Pearson, MD;  Location: [place];  Service: Ophthalmology;  Laterality: Right;  . COLONOSCOPY W/ BIOPSIES AND POLYPECTOMY    . COLONOSCOPY WITH PROPOFOL N/A 09/07/2014   Procedure:  COLONOSCOPY WITH PROPOFOL;  Surgeon: Juanita Craver, MD;  Location: [facility];  Service: Endoscopy;  Laterality: N/A;  . CORONARY ANGIOPLASTY WITH STENT PLACEMENT  01/15/2017  . CORONARY ATHERECTOMY N/A 01/15/2017   Procedure: CORONARY ATHERECTOMY;  Surgeon: Nelva Bush, MD;  Location: [place] CV [facility];  Service: Cardiovascular;  Laterality: N/A;  . CORONARY BALLOON ANGIOPLASTY N/A 01/15/2017   Procedure: CORONARY BALLOON ANGIOPLASTY;  Surgeon: Nelva Bush, MD;  Location: [place] [facility];  Service: Cardiovascular;  Laterality: N/A;  . CORONARY STENT INTERVENTION N/A 01/15/2017   Procedure: CORONARY STENT INTERVENTION;  Surgeon: Nelva Bush, MD;  Location: [place] [facility];  Service: Cardiovascular;  Laterality: N/A;  . ESOPHAGOGASTRODUODENOSCOPY (EGD) WITH PROPOFOL N/A 09/07/2014   Procedure: ESOPHAGOGASTRODUODENOSCOPY (EGD) WITH PROPOFOL;  Surgeon: Juanita Craver, MD;  Location: [facility];  Service: Endoscopy;  Laterality: N/A;  . EXTERNAL EAR SURGERY Bilateral 1970s   tumors removed  . EYE SURGERY Left 2019   cataract extraction   . EYE SURGERY Right 02/09/2018   eyelid surgery   . INTRAVASCULAR PRESSURE WIRE/FFR STUDY N/A 12/12/2016   Procedure: INTRAVASCULAR PRESSURE WIRE/FFR STUDY;  Surgeon: Nelva Bush, MD;  Location: [place] CV [facility];  Service: Cardiovascular;  Laterality: N/A;  . MINI SHUNT INSERTION Right 07/20/2013   Procedure: INSERTION OF GLAUCOMA FILTRATION DEVICE RIGHT EYE;  Surgeon: Marylynn Pearson, MD;  Location: [place];  Service: Ophthalmology;  Laterality: Right;  . MITOMYCIN C APPLICATION Right [id number]   Procedure: MITOMYCIN C APPLICATION;  Surgeon: Marylynn Pearson, MD;  Location: [place];  Service: Ophthalmology;  Laterality: Right;  . MITOMYCIN C APPLICATION Right [id number]   Procedure: MITOMYCIN C APPLICATION RIGHT EYE;  Surgeon: Marylynn Pearson, MD;  Location: [place];  Service: Ophthalmology;  Laterality: Right;  . PLACEMENT OF BREAST IMPLANTS Bilateral 1992    "took all my breast tissue out; put implants in;fibrocystic breast disease "  . RIGHT/LEFT HEART CATH AND CORONARY ANGIOGRAPHY N/A 12/12/2016   Procedure: RIGHT/LEFT HEART CATH AND CORONARY ANGIOGRAPHY;  Surgeon: Nelva Bush, MD;  Location: [place] [facility];  Service: Cardiovascular;  Laterality: N/A;  . TONSILLECTOMY    . TOTAL ABDOMINAL HYSTERECTOMY  1982  . TRABECULECTOMY Right 02/21/2015   Procedure: TRABECULECTOMY WITH [facility] ON THE RIGHT EYE;  Surgeon: Marylynn Pearson, MD;  Location: [place];  Service: Ophthalmology;  Laterality: Right;     OB History    Gravida  2   Para  2   Term      Preterm      AB      Living  2     SAB      TAB      Ectopic      Multiple      Live Births              Family History  Problem Relation Age of Onset  . Heart disease Father   .  Diabetes Father   . Glaucoma Father   . Cancer Mother        unknown type, ?lung  . Heart disease Paternal Grandmother   . Cancer Paternal Grandmother        unknown type  . Diabetes Brother   . Glaucoma Brother     Social History   Tobacco Use  . Smoking status: Former Smoker    Packs/day: 1.00    Years: 4.00    Pack years: 4.00    Types: Cigarettes    Quit date: 02/11/1980    Years since quitting: 38.9  . Smokeless tobacco: Never Used  Substance Use Topics  . Alcohol use: No  . Drug use: No    Home Medications Prior to Admission medications   Medication Sig Start Date End Date Taking? Authorizing Provider  acetaminophen (TYLENOL) 500 MG tablet Take 1,000 mg by mouth 2 (two) times daily as needed for moderate pain or headache.   Yes [provider]  albuterol (PROVENTIL) (2.5 MG/3ML) 0.083% nebulizer solution Take 3 mLs (2.5 mg total) by nebulization daily as needed for wheezing or shortness of breath. 04/22/18  Yes Young, Tarri Fuller D, MD  albuterol (VENTOLIN HFA) 108 (90 Base) MCG/ACT inhaler Inhale 2 puffs into the lungs every 6 (six) hours as needed for wheezing or  shortness of breath. 06/28/18  Yes Young, Tarri Fuller D, MD  atorvastatin (LIPITOR) 80 MG tablet Take 1 tablet (80 mg total) by mouth daily. 11/12/18 11/07/19 Yes [name], Wonda Cheng, MD  brimonidine (ALPHAGAN P) 0.1 % SOLN Place 1 drop into both eyes 2 (two) times daily.    Yes [provider]  brinzolamide (AZOPT) 1 % ophthalmic suspension Place 1 drop into both eyes 3 (three) times daily.    Yes [provider]  carvedilol (COREG) 12.5 MG tablet TAKE 1 TABLET(12.5 MG) BY MOUTH TWICE DAILY Patient taking differently: Take 12.5 mg by mouth 2 (two) times daily with a meal.  11/08/18  Yes [name], Wonda Cheng, MD  clopidogrel (PLAVIX) 75 MG tablet Take 1 tablet (75 mg total) by mouth daily. 10/19/18  Yes Daune Perch, NP  furosemide (LASIX) 40 MG tablet Take one tablet (40mg )  Each day for 5 days starting on 09/20/18.... then one tablet EVERY OTHER day thereafter. Patient taking differently: Take 40 mg by mouth daily as needed for fluid.  09/22/18  Yes Daune Perch, NP  isosorbide mononitrate (IMDUR) 60 MG 24 hr tablet Take 1 tablet (60 mg total) by mouth daily. 12/08/18  Yes [name], Wonda Cheng, MD  JANUMET 50-500 MG tablet TAKE 1 TABLET BY MOUTH TWICE DAILY WITH A MEAL Patient taking differently: Take 1 tablet by mouth 2 (two) times daily with a meal.  09/08/18  Yes Glendale Chard, MD  montelukast (SINGULAIR) 10 MG tablet Take 1 tablet (10 mg total) by mouth every evening. 12/06/18  Yes Glendale Chard, MD  nitroGLYCERIN (NITROSTAT) 0.4 MG SL tablet Place 1 tablet (0.4 mg total) under the tongue every 5 (five) minutes as needed for chest pain. 12/12/16  Yes End, Harrell Gave, MD  olmesartan (BENICAR) 20 MG tablet Take 20 mg by mouth daily.   Yes [provider]  pantoprazole (PROTONIX) 40 MG tablet TAKE 1 TABLET(40 MG) BY MOUTH DAILY Patient taking differently: Take 40 mg by mouth daily.  06/16/18  Yes Bhagat, Bhavinkumar, PA  potassium chloride SA (K-DUR) 20 MEQ tablet Take 1 tablet (20 mEq  total) by mouth daily. Only take when taking Lasix Patient taking differently: Take  20 mEq by mouth See admin instructions. Only take when taking Lasix 10/19/18  Yes Daune Perch, NP  prednisoLONE acetate (PRED FORTE) 1 % ophthalmic suspension Place 1 drop into both eyes 4 (four) times daily.  09/24/18  Yes [provider]  pregabalin (LYRICA) 75 MG capsule Take 1 capsule (75 mg total) by mouth 2 (two) times daily. 10/25/18  Yes Glendale Chard, MD  travoprost, benzalkonium, (TRAVATAN) 0.004 % ophthalmic solution Place 1 drop into the right eye at bedtime.    Yes [provider]  Lancets (ONETOUCH DELICA PLUS [id number]) [place] AND DINNER 05/28/18   Glendale Chard, MD  Nebulizers (COMPRESSOR/NEBULIZER) MISC Use as directed 04/22/18   Deneise Lever, MD  promethazine (PHENERGAN) 25 MG tablet Take 1 tablet (25 mg total) by mouth every 6 (six) hours as needed for nausea or vomiting. Patient not taking: Reported on 01/24/2019 01/01/19   Kinnie Feil, PA-C  traMADol (ULTRAM) 50 MG tablet Take 1 tablet (50 mg total) by mouth every 6 (six) hours as needed. Patient not taking: Reported on 01/24/2019 03/18/18   Glendale Chard, MD    Allergies    Crestor [rosuvastatin calcium], Demerol  [meperidine hcl], Shellfish allergy, and Demerol [meperidine]  Review of Systems   Review of Systems  Constitutional: Negative for chills and fever.  HENT: Negative for congestion and rhinorrhea.   Eyes: Negative for redness and visual disturbance.  Respiratory: Positive for shortness of breath. Negative for wheezing.   Cardiovascular: Positive for chest pain. Negative for palpitations.  Gastrointestinal: Negative for nausea and vomiting.  Genitourinary: Negative for dysuria and urgency.  Musculoskeletal: Negative for arthralgias and myalgias.  Skin: Negative for pallor and wound.  Neurological: Negative for dizziness and headaches.    Physical Exam Updated  Vital Signs BP 127/80   Pulse 76   Temp 98.6 F (37 C) (Oral)   Resp (!) 25   Ht 5\' 2"  (1.575 m)   Wt 77.1 kg   SpO2 100%   BMI 31.09 kg/m   Physical Exam Vitals and nursing note reviewed.  Constitutional:      General: She is not in acute distress.    Appearance: She is well-developed. She is not diaphoretic.  HENT:     Head: Normocephalic and atraumatic.  Eyes:     Pupils: Pupils are equal, round, and reactive to light.  Cardiovascular:     Rate and Rhythm: Normal rate and regular rhythm.     Heart sounds: No murmur. No friction rub. No gallop.   Pulmonary:     Effort: Pulmonary effort is normal.     Breath sounds: No wheezing or rales.  Chest:     Chest wall: Tenderness ( different than the pain she is feeling) present.  Abdominal:     General: There is no distension.     Palpations: Abdomen is soft.     Tenderness: There is no abdominal tenderness.  Musculoskeletal:        General: No tenderness.     Cervical back: Normal range of motion and neck supple.  Skin:    General: Skin is warm and dry.  Neurological:     Mental Status: She is alert and oriented to person, place, and time.  Psychiatric:        Behavior: Behavior normal.     ED Results / Procedures / Treatments   Labs (all labs ordered are listed, but only abnormal results are displayed) Labs Reviewed  BASIC METABOLIC  PANEL - Abnormal; Notable for the following components:      Result Value   Potassium 3.4 (*)    Glucose, Bld 117 (*)    All other components within normal limits  CBC - Abnormal; Notable for the following components:   Hemoglobin 11.1 (*)    HCT 35.0 (*)    All other components within normal limits  TROPONIN I (HIGH SENSITIVITY)  TROPONIN I (HIGH SENSITIVITY)    EKG EKG Interpretation  Date/Time:  "Sunday January 23 2019 22:32:12 EST Ventricular Rate:  80 PR Interval:  142 QRS Duration: 70 QT Interval:  386 QTC Calculation: 445 R Axis:   9 Text Interpretation: Normal  sinus rhythm Normal ECG No significant change since last tracing Confirmed by ,  ([id number]) on 01/24/2019 2:32:09 AM   Radiology DG Chest 2 View  Result Date: 01/23/2019 CLINICAL DATA:  Chest pain for several hours EXAM: CHEST - 2 VIEW COMPARISON:  12/30/2018 FINDINGS: Cardiac shadows within normal limits. The lungs are well aerated bilaterally. No focal infiltrate or sizable effusion is seen. No bony abnormality is noted. IMPRESSION: No active cardiopulmonary disease. Electronically Signed   By: Mark  Lukens M.D.   On: 01/23/2019 23:29    Procedures Procedures (including critical care time)  Medications Ordered in ED Medications  alum & mag hydroxide-simeth (MAALOX/MYLANTA) 200-200-20 MG/5ML suspension 30 mL (30 mLs Oral Given 01/24/19 0304)    ED Course  I have reviewed the triage vital signs and the nursing notes.  Pertinent labs & imaging results that were available during my care of the patient were reviewed by me and considered in my medical decision making (see chart for details).    MDM Rules/Calculators/A&P     CHA2DS2/VAS Stroke Risk Points      N/A >= 2 Points: High Risk  1 - 1.99 Points: Medium Risk  0 Points: Low Risk    A final score could not be computed because of missing components.: Last  Change: N/A     This score determines the patient's risk of having a stroke if the  patient has atrial fibrillation.      This score is not applicable to this patient. Components are not  calculated.                   73"  yo F with a cc of palpitations.  Patient has a history of the same.  This would be the third ED visit in the same number years for similar.  Work-ups were largely unremarkable.  She apparently also had a 30-day Holter at one point that was reportedly normal.  In normal sinus on the monitor when she states that she feels symptoms.  She does have some chest wall tenderness, I wonder if that could be part of her symptomatology.  Will give a GI cocktail.  As  her symptoms come and go into have chest pain with them will obtain a second troponin.  Delta negative. Ambulates without issue.  D/c home.   7:18 AM:  I have discussed the diagnosis/risks/treatment options with the patient and believe the pt to be eligible for discharge home to follow-up with PCP, Cards. We also discussed returning to the ED immediately if new or worsening sx occur. We discussed the sx which are most concerning (e.g., sudden worsening pain, fever, inability to tolerate by mouth) that necessitate immediate return. Medications administered to the patient during their visit and any new prescriptions provided to the patient are listed below.  Medications  given during this visit Medications  alum & mag hydroxide-simeth (MAALOX/MYLANTA) 200-200-20 MG/5ML suspension 30 mL (30 mLs Oral Given 01/24/19 0304)     The patient appears reasonably screen and/or stabilized for discharge and I doubt any other medical condition or other [facility] requiring further screening, evaluation, or treatment in the ED at this time prior to discharge.   Final Clinical Impression(s) / ED Diagnoses Final diagnoses:  Palpitations    Rx / DC Orders ED Discharge Orders    None       Deno Etienne, DO 01/24/19 [phone]

## 2019-02-07 ENCOUNTER — Telehealth: Payer: Medicare Other | Admitting: Pharmacist

## 2019-02-08 ENCOUNTER — Other Ambulatory Visit: Payer: Self-pay

## 2019-02-08 ENCOUNTER — Ambulatory Visit (HOSPITAL_COMMUNITY): Payer: Medicare Other | Attending: Cardiovascular Disease

## 2019-02-08 DIAGNOSIS — R002 Palpitations: Secondary | ICD-10-CM | POA: Insufficient documentation

## 2019-02-08 DIAGNOSIS — M7989 Other specified soft tissue disorders: Secondary | ICD-10-CM | POA: Insufficient documentation

## 2019-02-08 DIAGNOSIS — R0602 Shortness of breath: Secondary | ICD-10-CM | POA: Diagnosis present

## 2019-02-10 ENCOUNTER — Other Ambulatory Visit: Payer: Self-pay | Admitting: Physician Assistant

## 2019-02-18 ENCOUNTER — Telehealth: Payer: Self-pay

## 2019-02-21 ENCOUNTER — Telehealth: Payer: Self-pay

## 2019-02-21 DIAGNOSIS — R262 Difficulty in walking, not elsewhere classified: Secondary | ICD-10-CM | POA: Diagnosis not present

## 2019-02-21 DIAGNOSIS — R531 Weakness: Secondary | ICD-10-CM | POA: Diagnosis not present

## 2019-02-21 DIAGNOSIS — M545 Low back pain: Secondary | ICD-10-CM | POA: Diagnosis not present

## 2019-02-25 ENCOUNTER — Encounter: Payer: Self-pay | Admitting: Internal Medicine

## 2019-02-28 DIAGNOSIS — R262 Difficulty in walking, not elsewhere classified: Secondary | ICD-10-CM | POA: Diagnosis not present

## 2019-02-28 DIAGNOSIS — M545 Low back pain: Secondary | ICD-10-CM | POA: Diagnosis not present

## 2019-02-28 DIAGNOSIS — R531 Weakness: Secondary | ICD-10-CM | POA: Diagnosis not present

## 2019-03-04 DIAGNOSIS — M545 Low back pain: Secondary | ICD-10-CM | POA: Diagnosis not present

## 2019-03-04 DIAGNOSIS — R531 Weakness: Secondary | ICD-10-CM | POA: Diagnosis not present

## 2019-03-04 DIAGNOSIS — R262 Difficulty in walking, not elsewhere classified: Secondary | ICD-10-CM | POA: Diagnosis not present

## 2019-03-07 DIAGNOSIS — H401122 Primary open-angle glaucoma, left eye, moderate stage: Secondary | ICD-10-CM | POA: Diagnosis not present

## 2019-03-07 DIAGNOSIS — H2013 Chronic iridocyclitis, bilateral: Secondary | ICD-10-CM | POA: Diagnosis not present

## 2019-03-07 DIAGNOSIS — H401113 Primary open-angle glaucoma, right eye, severe stage: Secondary | ICD-10-CM | POA: Diagnosis not present

## 2019-03-09 DIAGNOSIS — M545 Low back pain: Secondary | ICD-10-CM | POA: Diagnosis not present

## 2019-03-09 DIAGNOSIS — R531 Weakness: Secondary | ICD-10-CM | POA: Diagnosis not present

## 2019-03-09 DIAGNOSIS — R262 Difficulty in walking, not elsewhere classified: Secondary | ICD-10-CM | POA: Diagnosis not present

## 2019-03-14 DIAGNOSIS — R262 Difficulty in walking, not elsewhere classified: Secondary | ICD-10-CM | POA: Diagnosis not present

## 2019-03-14 DIAGNOSIS — M545 Low back pain: Secondary | ICD-10-CM | POA: Diagnosis not present

## 2019-03-14 DIAGNOSIS — R531 Weakness: Secondary | ICD-10-CM | POA: Diagnosis not present

## 2019-03-16 DIAGNOSIS — R531 Weakness: Secondary | ICD-10-CM | POA: Diagnosis not present

## 2019-03-16 DIAGNOSIS — M545 Low back pain: Secondary | ICD-10-CM | POA: Diagnosis not present

## 2019-03-16 DIAGNOSIS — R262 Difficulty in walking, not elsewhere classified: Secondary | ICD-10-CM | POA: Diagnosis not present

## 2019-03-21 DIAGNOSIS — R531 Weakness: Secondary | ICD-10-CM | POA: Diagnosis not present

## 2019-03-21 DIAGNOSIS — R262 Difficulty in walking, not elsewhere classified: Secondary | ICD-10-CM | POA: Diagnosis not present

## 2019-03-21 DIAGNOSIS — M545 Low back pain: Secondary | ICD-10-CM | POA: Diagnosis not present

## 2019-03-23 ENCOUNTER — Other Ambulatory Visit: Payer: Self-pay

## 2019-03-23 ENCOUNTER — Ambulatory Visit (INDEPENDENT_AMBULATORY_CARE_PROVIDER_SITE_OTHER): Payer: Medicare Other | Admitting: Internal Medicine

## 2019-03-23 ENCOUNTER — Ambulatory Visit (INDEPENDENT_AMBULATORY_CARE_PROVIDER_SITE_OTHER): Payer: Medicare Other

## 2019-03-23 ENCOUNTER — Encounter: Payer: Self-pay | Admitting: Internal Medicine

## 2019-03-23 ENCOUNTER — Ambulatory Visit (INDEPENDENT_AMBULATORY_CARE_PROVIDER_SITE_OTHER): Payer: Medicare Other | Admitting: Pharmacist

## 2019-03-23 VITALS — BP 136/78 | HR 85 | Temp 97.9°F | Ht 61.6 in | Wt 175.6 lb

## 2019-03-23 DIAGNOSIS — E119 Type 2 diabetes mellitus without complications: Secondary | ICD-10-CM

## 2019-03-23 DIAGNOSIS — I10 Essential (primary) hypertension: Secondary | ICD-10-CM

## 2019-03-23 DIAGNOSIS — R600 Localized edema: Secondary | ICD-10-CM | POA: Diagnosis not present

## 2019-03-23 DIAGNOSIS — E1159 Type 2 diabetes mellitus with other circulatory complications: Secondary | ICD-10-CM | POA: Diagnosis not present

## 2019-03-23 DIAGNOSIS — E6609 Other obesity due to excess calories: Secondary | ICD-10-CM | POA: Diagnosis not present

## 2019-03-23 DIAGNOSIS — Z6832 Body mass index (BMI) 32.0-32.9, adult: Secondary | ICD-10-CM | POA: Diagnosis not present

## 2019-03-23 DIAGNOSIS — I739 Peripheral vascular disease, unspecified: Secondary | ICD-10-CM

## 2019-03-23 DIAGNOSIS — E1165 Type 2 diabetes mellitus with hyperglycemia: Secondary | ICD-10-CM

## 2019-03-23 DIAGNOSIS — E669 Obesity, unspecified: Secondary | ICD-10-CM | POA: Diagnosis not present

## 2019-03-23 DIAGNOSIS — I25119 Atherosclerotic heart disease of native coronary artery with unspecified angina pectoris: Secondary | ICD-10-CM

## 2019-03-23 DIAGNOSIS — E1169 Type 2 diabetes mellitus with other specified complication: Secondary | ICD-10-CM | POA: Diagnosis not present

## 2019-03-23 DIAGNOSIS — Z0000 Encounter for general adult medical examination without abnormal findings: Secondary | ICD-10-CM

## 2019-03-23 LAB — POCT URINALYSIS DIPSTICK
Bilirubin, UA: NEGATIVE
Blood, UA: NEGATIVE
Glucose, UA: NEGATIVE
Ketones, UA: NEGATIVE
Leukocytes, UA: NEGATIVE
Nitrite, UA: NEGATIVE
Protein, UA: NEGATIVE
Spec Grav, UA: 1.02 (ref 1.010–1.025)
Urobilinogen, UA: 0.2 E.U./dL
pH, UA: 5.5 (ref 5.0–8.0)

## 2019-03-23 LAB — POCT UA - MICROALBUMIN
Albumin/Creatinine Ratio, Urine, POC: <30
Creatinine, POC: 200 mg/dL
Microalbumin Ur, POC: 10 mg/L

## 2019-03-23 NOTE — Progress Notes (Signed)
This visit occurred during the SARS-CoV-2 public health emergency.  Safety protocols were in place, including screening questions prior to the visit, additional usage of staff PPE, and extensive cleaning of exam room while observing appropriate contact time as indicated for disinfecting solutions.  Subjective:   Sheila Reynolds is a 74 y.o. female who presents for Medicare Annual (Subsequent) preventive examination.  Review of Systems:  n/a Cardiac Risk Factors include: advanced age (>36men, >66 women);diabetes mellitus;hypertension;dyslipidemia;obesity (BMI >30kg/m2)     Objective:     Vitals: BP 136/78 (BP Location: Right Arm, Patient Position: Sitting, Cuff Size: Normal)   Pulse 85   Temp 97.9 F (36.6 C) (Oral)   Ht 5' 1.6" (1.565 m)   Wt 175 lb 9.6 oz (79.7 kg)   SpO2 97%   BMI 32.54 kg/m   Body mass index is 32.54 kg/m.  Advanced Directives 03/23/2019 01/23/2019 01/01/2019 03/18/2018 08/26/2017 03/23/2017 01/15/2017  Does Patient Have a Medical Advance Directive? No No No No No No No  Would patient like information on creating a medical advance directive? Yes (MAU/Ambulatory/Procedural Areas - Information given) - - Yes (MAU/Ambulatory/Procedural Areas - Information given) No - Patient declined - No - Patient declined  Pre-existing out of facility DNR order (yellow form or pink MOST form) - - - - - - -    Tobacco Social History   Tobacco Use  Smoking Status Former Smoker  . Packs/day: 1.00  . Years: 4.00  . Pack years: 4.00  . Types: Cigarettes  . Quit date: 02/11/1980  . Years since quitting: 39.1  Smokeless Tobacco Never Used     Counseling given: Not Answered   Clinical Intake:  Pre-visit preparation completed: Yes  Pain : 0-10 Pain Score: 6  Pain Type: Chronic pain Pain Location: Knee Pain Orientation: Left Pain Descriptors / Indicators: Aching Pain Onset: More than a month ago Pain Frequency: Constant Pain Relieving Factors: cbd cream eases it  Pain  Relieving Factors: cbd cream eases it  Nutritional Status: BMI > 30  Obese Nutritional Risks: None Diabetes: Yes  How often do you need to have someone help you when you read instructions, pamphlets, or other written materials from your doctor or pharmacy?: 1 - Never What is the last grade level you completed in school?: 3 1/2 years college  Interpreter Needed?: No  Information entered by :: NAllen LPN  Past Medical History:  Diagnosis Date  . Anemia    3 months ago anemic  . Anxiety    on meds  . Arthritis    "all over" (01/15/2017)  . Asthma   . Bronchitis with emphysema   . Chest pain   . Chronic bronchitis ([place])   . Coronary artery disease    a. 01/2017 she underwent orbital atherectomy/DES to the proxmal LAD and PTCA to ostial D2. 2D Echo 01/15/17 showed mild LVH, EF 60-65%, grade 1 DD.  . Family history of anesthesia complication    daughter N/V  . Fibromyalgia   . GERD (gastroesophageal reflux disease)    on meds  . Heart murmur   . History of hiatal hernia   . Hx of echocardiogram    Echo (03/2013):  Tech limited; Mild focal basal septal hypertrophy, EF 60-65%, normal RVF  . Hyperlipidemia   . Hypertension   . Nonsustained ventricular tachycardia ([place])   . OSA on CPAP   . Premature atrial contractions   . PVC (premature ventricular contraction)    a. Holter 12/16: NSR, occ PAC,PVCs  .  Sarcoidosis   . Type II diabetes mellitus ([place])    Past Surgical History:  Procedure Laterality Date  . BREAST SURGERY    . CARDIAC CATHETERIZATION  05/04/2007   reveals overall normal left ventricular systolic function. Ejection fraction 65-70%  . CATARACT EXTRACTION W/PHACO Right 07/20/2013   Procedure: CATARACT EXTRACTION PHACO AND INTRAOCULAR LENS PLACEMENT (IOC);  Surgeon: Marylynn Pearson, MD;  Location: [place];  Service: Ophthalmology;  Laterality: Right;  . COLONOSCOPY W/ BIOPSIES AND POLYPECTOMY    . COLONOSCOPY WITH PROPOFOL N/A 09/07/2014   Procedure: COLONOSCOPY WITH  PROPOFOL;  Surgeon: Juanita Craver, MD;  Location: [facility];  Service: Endoscopy;  Laterality: N/A;  . CORONARY ANGIOPLASTY WITH STENT PLACEMENT  01/15/2017  . CORONARY ATHERECTOMY N/A 01/15/2017   Procedure: CORONARY ATHERECTOMY;  Surgeon: Nelva Bush, MD;  Location: [place] [facility];  Service: Cardiovascular;  Laterality: N/A;  . CORONARY BALLOON ANGIOPLASTY N/A 01/15/2017   Procedure: CORONARY BALLOON ANGIOPLASTY;  Surgeon: Nelva Bush, MD;  Location: [place] [facility];  Service: Cardiovascular;  Laterality: N/A;  . CORONARY STENT INTERVENTION N/A 01/15/2017   Procedure: CORONARY STENT INTERVENTION;  Surgeon: Nelva Bush, MD;  Location: [place] [facility];  Service: Cardiovascular;  Laterality: N/A;  . ESOPHAGOGASTRODUODENOSCOPY (EGD) WITH PROPOFOL N/A 09/07/2014   Procedure: ESOPHAGOGASTRODUODENOSCOPY (EGD) WITH PROPOFOL;  Surgeon: Juanita Craver, MD;  Location: [facility];  Service: Endoscopy;  Laterality: N/A;  . EXTERNAL EAR SURGERY Bilateral 1970s   tumors removed  . EYE SURGERY Left 2019   cataract extraction   . EYE SURGERY Right 02/09/2018   eyelid surgery   . INTRAVASCULAR PRESSURE WIRE/FFR STUDY N/A 12/12/2016   Procedure: INTRAVASCULAR PRESSURE WIRE/FFR STUDY;  Surgeon: Nelva Bush, MD;  Location: [place] [facility];  Service: Cardiovascular;  Laterality: N/A;  . MINI SHUNT INSERTION Right 07/20/2013   Procedure: INSERTION OF GLAUCOMA FILTRATION DEVICE RIGHT EYE;  Surgeon: Marylynn Pearson, MD;  Location: [place];  Service: Ophthalmology;  Laterality: Right;  . MITOMYCIN C APPLICATION Right [id number]   Procedure: MITOMYCIN C APPLICATION;  Surgeon: Marylynn Pearson, MD;  Location: [place];  Service: Ophthalmology;  Laterality: Right;  . MITOMYCIN C APPLICATION Right [id number]   Procedure: MITOMYCIN C APPLICATION RIGHT EYE;  Surgeon: Marylynn Pearson, MD;  Location: [place];  Service: Ophthalmology;  Laterality: Right;  . PLACEMENT OF BREAST IMPLANTS Bilateral 1992   "took all my  breast tissue out; put implants in;fibrocystic breast disease "  . RIGHT/LEFT HEART CATH AND CORONARY ANGIOGRAPHY N/A 12/12/2016   Procedure: RIGHT/LEFT HEART CATH AND CORONARY ANGIOGRAPHY;  Surgeon: Nelva Bush, MD;  Location: [place] [facility];  Service: Cardiovascular;  Laterality: N/A;  . TONSILLECTOMY    . TOTAL ABDOMINAL HYSTERECTOMY  1982  . TRABECULECTOMY Right 02/21/2015   Procedure: TRABECULECTOMY WITH [facility] ON THE RIGHT EYE;  Surgeon: Marylynn Pearson, MD;  Location: [place];  Service: Ophthalmology;  Laterality: Right;   Family History  Problem Relation Age of Onset  . Heart disease Father   . Diabetes Father   . Glaucoma Father   . Cancer Mother        unknown type, ?lung  . Heart disease Paternal Grandmother   . Cancer Paternal Grandmother        unknown type  . Diabetes Brother   . Glaucoma Brother    Social History   Socioeconomic History  . Marital status: Married    Spouse name: Not on file  . Number of children: 2  . Years of education:  Not on file  . Highest education level: Not on file  Occupational History  . Occupation: unemployed    Fish farm manager: [employer]  Tobacco Use  . Smoking status: Former Smoker    Packs/day: 1.00    Years: 4.00    Pack years: 4.00    Types: Cigarettes    Quit date: 02/11/1980    Years since quitting: 39.1  . Smokeless tobacco: Never Used  Substance and Sexual Activity  . Alcohol use: No  . Drug use: No  . Sexual activity: Yes  Other Topics Concern  . Not on file  Social History Narrative  . Not on file   Social Determinants of Health   Financial Resource Strain: Low Risk   . Difficulty of Paying Living Expenses: Not hard at all  Food Insecurity: No Food Insecurity  . Worried About Charity fundraiser in the Last Year: Never true  . Ran Out of Food in the Last Year: Never true  Transportation Needs: No Transportation Needs  . Lack of Transportation (Medical): No  . Lack of Transportation (Non-Medical):  No  Physical Activity: Sufficiently Active  . Days of Exercise per Week: 3 days  . Minutes of Exercise per Session: 60 min  Stress: Stress Concern Present  . Feeling of Stress : To some extent  Social Connections:   . Frequency of Communication with Friends and Family: Not on file  . Frequency of Social Gatherings with Friends and Family: Not on file  . Attends Religious Services: Not on file  . Active Member of Clubs or Organizations: Not on file  . Attends Archivist Meetings: Not on file  . Marital Status: Not on file    Outpatient Encounter Medications as of 03/23/2019  Medication Sig  . acetaminophen (TYLENOL) 500 MG tablet Take 1,000 mg by mouth 2 (two) times daily as needed for moderate pain or headache.  . albuterol (PROVENTIL) (2.5 MG/3ML) 0.083% nebulizer solution Take 3 mLs (2.5 mg total) by nebulization daily as needed for wheezing or shortness of breath.  [name] albuterol (VENTOLIN HFA) 108 (90 Base) MCG/ACT inhaler Inhale 2 puffs into the lungs every 6 (six) hours as needed for wheezing or shortness of breath.  [name] atorvastatin (LIPITOR) 80 MG tablet Take 1 tablet (80 mg total) by mouth daily.  . brimonidine (ALPHAGAN P) 0.1 % SOLN Place 1 drop into both eyes 2 (two) times daily.   . brinzolamide (AZOPT) 1 % ophthalmic suspension Place 1 drop into both eyes 3 (three) times daily.   . carvedilol (COREG) 12.5 MG tablet TAKE 1 TABLET(12.5 MG) BY MOUTH TWICE DAILY (Patient taking differently: Take 12.5 mg by mouth 2 (two) times daily with a meal. )  . clopidogrel (PLAVIX) 75 MG tablet Take 1 tablet (75 mg total) by mouth daily.  . furosemide (LASIX) 40 MG tablet Take one tablet (40mg )  Each day for 5 days starting on 09/20/18.... then one tablet EVERY OTHER day thereafter. (Patient taking differently: Take 40 mg by mouth daily as needed for fluid. )  . isosorbide mononitrate (IMDUR) 60 MG 24 hr tablet Take 1 tablet (60 mg total) by mouth daily.  [name] JANUMET 50-500 MG tablet TAKE 1  TABLET BY MOUTH TWICE DAILY WITH A MEAL (Patient taking differently: Take 1 tablet by mouth 2 (two) times daily with a meal. )  . Lancets (ONETOUCH DELICA PLUS [id number]) MISC CHECK BLOOD SUGAR BEFORE BREAKFAST AND DINNER  . montelukast (SINGULAIR) 10 MG tablet Take 1 tablet (  10 mg total) by mouth every evening.  . Nebulizers (COMPRESSOR/NEBULIZER) MISC Use as directed  . nitroGLYCERIN (NITROSTAT) 0.4 MG SL tablet Place 1 tablet (0.4 mg total) under the tongue every 5 (five) minutes as needed for chest pain.  [name] olmesartan (BENICAR) 20 MG tablet Take 20 mg by mouth daily.  . pantoprazole (PROTONIX) 40 MG tablet TAKE 1 TABLET(40 MG) BY MOUTH DAILY  . potassium chloride SA (KLOR-CON) 20 MEQ tablet Take 1 tablet (20 mEq total) by mouth 2 (two) times daily. On the days you take Furosemide (Lasix)  . prednisoLONE acetate (PRED FORTE) 1 % ophthalmic suspension Place 1 drop into both eyes 4 (four) times daily.   . pregabalin (LYRICA) 75 MG capsule Take 1 capsule (75 mg total) by mouth 2 (two) times daily.  . travoprost, benzalkonium, (TRAVATAN) 0.004 % ophthalmic solution Place 1 drop into the right eye at bedtime.   . promethazine (PHENERGAN) 25 MG tablet Take 1 tablet (25 mg total) by mouth every 6 (six) hours as needed for nausea or vomiting. (Patient not taking: Reported on 01/24/2019)  . traMADol (ULTRAM) 50 MG tablet Take 1 tablet (50 mg total) by mouth every 6 (six) hours as needed. (Patient not taking: Reported on 01/24/2019)   No facility-administered encounter medications on file as of 03/23/2019.    Activities of Daily Living In your present state of health, do you have any difficulty performing the following activities: 03/23/2019  Hearing? Y  Comment wear hearing aides  Vision? N  Difficulty concentrating or making decisions? Y  Walking or climbing stairs? Y  Comment gets sob and knee hurts  Dressing or bathing? N  Doing errands, shopping? N  Preparing Food and eating ? N  Using the  Toilet? N  In the past six months, have you accidently leaked urine? Y  Comment wears a pad  Do you have problems with loss of bowel control? N  Managing your Medications? N  Managing your Finances? N  Housekeeping or managing your Housekeeping? N  Some recent data might be hidden    Patient Care Team: Glendale Chard, MD as PCP - General (Internal Medicine) [name], Wonda Cheng, MD as PCP - Cardiology (Cardiology) [name], Wonda Cheng, MD as Consulting Physician (Cardiology) Daneen Schick as Social Worker Little, [name], RN as Case Manager Lavera Guise, [facility] (Pharmacist)    Assessment:   This is a routine wellness examination for Jahniya.  Exercise Activities and Dietary recommendations Current Exercise Habits: Structured exercise class, Type of exercise: strength training/weights(has physical therapy, stationary bike), Time (Minutes): 60, Frequency (Times/Week): 3, Weekly Exercise (Minutes/Week): 180  Goals    . Assist with care coordination related to referral for Janumet     Current Barriers:  . Unknown at this time  Clinical Social Work Clinical Goal(s):  [name] Over the next 45 days the patient will work with embedded PharmD to address concerns related to [facility] prescription.  CCM SW Interventions: Completed 11/23/2018 . Referral received from patient provider Dr. Glendale Chard indicating assistance is needed with medication Janumet . Performed chart review to conclude the patient enrolled in case management program on October 21 2018. At the time of enrollment, patient did not indicate medication concerns therefore embedded PharmD was not involved . Communication with embedded PharmD to request patient outreach to provide assistance . Closed referral considering the patient is already active with CCM program  Patient Self Care Activities:  . Self administers medications as prescribed . Performs ADL's independently . Performs IADL's  independently  Initial goal  documentation     . Assist with Chronic Care Management and Care Coordination needs     Current Barriers:  [name] Knowledge Barriers related to resources and support available to address needs related to Chronic Care Management and Care Coordination needs  Case Manager Clinical Goal(s):  [name] Over the next 30 days, patient will work with the CCM team to address needs related to Chronic Care Management and Care Coordination needs  Interventions:  . Collaborated with BSW and initiated plan of care to address needs related to Chronic disease management  Patient Self Care Activities:  . Patient currently unable to independently manage chronic conditions   Initial goal documentation     . I need help getting my medications (pt-stated)     Current Barriers:  . Financial Barriers: patient has Medicare insurance and reports copay for Janumet, lyrica & inhalers is cost prohibitive at this time  Pharmacist Clinical Goal(s):  [name] Over the next 30 days, patient will work with PharmD and providers to relieve medication access concerns  Interventions: . Comprehensive medication review completed; medication list updated in electronic medical record.  . Patient meets income/NO out of pocket spend criteria for this medication's patient assistance program. Reviewed application process. Patient will provide proof of income, out of pocket spend report, and will sign application. Will collaborate with primary care provider, Dr. Glendale Chard, for their portion of application. Once completed, will submit to patient assistance program.  Patient Self Care Activities:  . Patient will provide necessary portions of application   Initial goal documentation     . I would like to optimize my medication management of my chronic conditions (pt-stated)     Current Barriers:  . Diabetes: T2DM; most recent A1c 6.1% on 07/20/18  . Current antihyperglycemic regimen: Janumet 50/500mg  . denies hypoglycemic symptoms; denies  hyperglycemic symptoms . Current meal patterns: discussed diet/lifestyle . Current exercise: n/a; encouraged walking as able . Current blood glucose readings: FBG<130, mostly 90-100 (denies hypoglycemia) . Cardiovascular risk reduction: o Current hypertensive regimen: coreg, isosorbide, olmesartan 20mg  (BP reported to be <130/80) o Current hyperlipidemia regimen: atorvastatin 80mg  daily (11/12/18 #90)  Pharmacist Clinical Goal(s):  [name] Over the next 90 days, patient with work with PharmD and primary care provider to address needs related to optimized medication management of chronic conditions  Interventions: . Comprehensive medication review performed, medication list updated in electronic medical record . Counseled patient on diet/lifestyle  Patient Self Care Activities:  . Patient will check blood glucose 3x weekly in the AM , document, and provide at future appointments . Patient will focus on medication adherence by continuing to take medications as prescribed and apply for financial assistance . Patient will take medications as prescribed . Patient will contact provider with any episodes of hypoglycemia . Patient will report any questions or concerns to provider   Initial goal documentation     . Patient Stated     No goals at this time    . Patient Stated     03/23/2019, wants to be pain free        Fall Risk Fall Risk  03/23/2019 10/20/2018 10/07/2018 07/20/2018 03/18/2018  Falls in the past year? 0 0 1 0 1  Number falls in past yr: - 1 1 - 0  Comment - - - - tripped ans fell  Injury with Fall? - - 0 - 0  Risk for fall due to : Medication side effect - - - History of fall(s)  Follow up Falls evaluation completed;Education provided;Falls prevention discussed - - - Education provided;Falls prevention discussed   Is the patient's home free of loose throw rugs in walkways, pet beds, electrical cords, etc?   yes      Grab bars in the bathroom? no      Handrails on the stairs?    yes      Adequate lighting?   yes  Timed Get Up and Go performed: n/a  Depression Screen PHQ 2/9 Scores 03/23/2019 07/20/2018 03/18/2018 03/18/2018  PHQ - 2 Score 2 - 1 3  PHQ- 9 Score 7 - - 9  Exception Documentation - Patient refusal - -     Cognitive Function     6CIT Screen 03/23/2019 03/18/2018  What Year? 0 points 0 points  What month? 0 points 0 points  What time? 0 points 0 points  Count back from 20 0 points 0 points  Months in reverse 0 points 0 points  Repeat phrase 0 points 0 points  Total Score 0 0    Immunization History  Administered Date(s) Administered  . Influenza Split 10/31/2013  . Influenza Whole 12/23/2017  . Influenza, High Dose Seasonal PF 11/18/2016, 12/21/2017, 11/23/2018  . Influenza,inj,Quad PF,6+ Mos 02/25/2013, 03/19/2015  . Influenza,inj,quad, With Preservative 11/11/2018  . Pneumococcal Polysaccharide-23 12/21/2017  . Zoster Recombinat (Shingrix) 12/25/2017    Qualifies for Shingles Vaccine? yes  Screening Tests Health Maintenance  Topic Date Due  . PNA vac Low Risk Adult (2 of 2 - PCV13) 03/22/2020 (Originally 12/22/2018)  . MAMMOGRAM  04/21/2019  . HEMOGLOBIN A1C  05/24/2019  . FOOT EXAM  10/07/2019  . OPHTHALMOLOGY EXAM  01/12/2020  . TETANUS/TDAP  11/10/2022  . COLONOSCOPY  09/06/2024  . INFLUENZA VACCINE  Completed  . DEXA SCAN  Completed  . Hepatitis C Screening  Completed    Cancer Screenings: Lung: Low Dose CT Chest recommended if Age 86-80 years, 30 pack-year currently smoking OR have quit w/in 15years. Patient does not qualify. Breast:  Up to date on Mammogram? Yes   Up to date of Bone Density/Dexa? Yes Colorectal: up to date  Additional Screenings: : Hepatitis C Screening: 12/2017     Plan:    Patient would like to be pain free.   I have personally reviewed and noted the following in the patient's chart:   . Medical and social history . Use of alcohol, tobacco or illicit drugs  . Current medications and  supplements . Functional ability and status . Nutritional status . Physical activity . Advanced directives . List of other physicians . Hospitalizations, surgeries, and ER visits in previous 12 months . Vitals . Screenings to include cognitive, depression, and falls . Referrals and appointments  In addition, I have reviewed and discussed with patient certain preventive protocols, quality metrics, and best practice recommendations. A written personalized care plan for preventive services as well as general preventive health recommendations were provided to patient.     Kellie Simmering, LPN  [id number]

## 2019-03-23 NOTE — Patient Instructions (Signed)
Edema  Edema is when you have too much fluid in your body or under your skin. Edema may make your legs, feet, and ankles swell up. Swelling is also common in looser tissues, like around your eyes. This is a common condition. It gets more common as you get older. There are many possible causes of edema. Eating too much salt (sodium) and being on your feet or sitting for a long time can cause edema in your legs, feet, and ankles. Hot weather may make edema worse. Edema is usually painless. Your skin may look swollen or shiny. Follow these instructions at home:  Keep the swollen body part raised (elevated) above the level of your heart when you are sitting or lying down.  Do not sit still or stand for a long time.  Do not wear tight clothes. Do not wear garters on your upper legs.  Exercise your legs. This can help the swelling go down.  Wear elastic bandages or support stockings as told by your doctor.  Eat a low-salt (low-sodium) diet to reduce fluid as told by your doctor.  Depending on the cause of your swelling, you may need to limit how much fluid you drink (fluid restriction).  Take over-the-counter and prescription medicines only as told by your doctor. Contact a doctor if:  Treatment is not working.  You have heart, liver, or kidney disease and have symptoms of edema.  You have sudden and unexplained weight gain. Get help right away if:  You have shortness of breath or chest pain.  You cannot breathe when you lie down.  You have pain, redness, or warmth in the swollen areas.  You have heart, liver, or kidney disease and get edema all of a sudden.  You have a fever and your symptoms get worse all of a sudden. Summary  Edema is when you have too much fluid in your body or under your skin.  Edema may make your legs, feet, and ankles swell up. Swelling is also common in looser tissues, like around your eyes.  Raise (elevate) the swollen body part above the level of your  heart when you are sitting or lying down.  Follow your doctor's instructions about diet and how much fluid you can drink (fluid restriction). This information is not intended to replace advice given to you by your health care provider. Make sure you discuss any questions you have with your health care provider. Document Revised: 01/30/2017 Document Reviewed: 02/15/2016 Elsevier Patient Education  2020 [employer]  

## 2019-03-23 NOTE — Patient Instructions (Signed)
Sheila Reynolds , Thank you for taking time to come for your Medicare Wellness Visit. I appreciate your ongoing commitment to your health goals. Please review the following plan we discussed and let me know if I can assist you in the future.   Screening recommendations/referrals: Colonoscopy: 08/2014 Mammogram: 04/2017 Bone Density: 12/2013 Recommended yearly ophthalmology/optometry visit for glaucoma screening and checkup Recommended yearly dental visit for hygiene and checkup  Vaccinations: Influenza vaccine: 11/2018 Pneumococcal vaccine: decline Tdap vaccine: 10/2012 Shingles vaccine: discussed    Advanced directives: Advance directive discussed with you today. I have provided a copy for you to complete at home and have notarized. Once this is complete please bring a copy in to our office so we can scan it into your chart.  Conditions/risks identified: obesity  Next appointment:    Preventive Care 18 Years and Older, Female Preventive care refers to lifestyle choices and visits with your health care provider that can promote health and wellness. What does preventive care include?  A yearly physical exam. This is also called an annual well check.  Dental exams once or twice a year.  Routine eye exams. Ask your health care provider how often you should have your eyes checked.  Personal lifestyle choices, including:  Daily care of your teeth and gums.  Regular physical activity.  Eating a healthy diet.  Avoiding tobacco and drug use.  Limiting alcohol use.  Practicing safe sex.  Taking low-dose aspirin every day.  Taking vitamin and mineral supplements as recommended by your health care provider. What happens during an annual well check? The services and screenings done by your health care provider during your annual well check will depend on your age, overall health, lifestyle risk factors, and family history of disease. Counseling  Your health care provider may ask you  questions about your:  Alcohol use.  Tobacco use.  Drug use.  Emotional well-being.  Home and relationship well-being.  Sexual activity.  Eating habits.  History of falls.  Memory and ability to understand (cognition).  Work and work Statistician.  Reproductive health. Screening  You may have the following tests or measurements:  Height, weight, and BMI.  Blood pressure.  Lipid and cholesterol levels. These may be checked every 5 years, or more frequently if you are over 69 years old.  Skin check.  Lung cancer screening. You may have this screening every year starting at age 5 if you have a 30-pack-year history of smoking and currently smoke or have quit within the past 15 years.  Fecal occult blood test (FOBT) of the stool. You may have this test every year starting at age 66.  Flexible sigmoidoscopy or colonoscopy. You may have a sigmoidoscopy every 5 years or a colonoscopy every 10 years starting at age 28.  Hepatitis C blood test.  Hepatitis B blood test.  Sexually transmitted disease (STD) testing.  Diabetes screening. This is done by checking your blood sugar (glucose) after you have not eaten for a while (fasting). You may have this done every 1-3 years.  Bone density scan. This is done to screen for osteoporosis. You may have this done starting at age 98.  Mammogram. This may be done every 1-2 years. Talk to your health care provider about how often you should have regular mammograms. Talk with your health care provider about your test results, treatment options, and if necessary, the need for more tests. Vaccines  Your health care provider may recommend certain vaccines, such as:  Influenza vaccine. This  is recommended every year.  Tetanus, diphtheria, and acellular pertussis (Tdap, Td) vaccine. You may need a Td booster every 10 years.  Zoster vaccine. You may need this after age 43.  Pneumococcal 13-valent conjugate (PCV13) vaccine. One dose is  recommended after age 69.  Pneumococcal polysaccharide (PPSV23) vaccine. One dose is recommended after age 58. Talk to your health care provider about which screenings and vaccines you need and how often you need them. This information is not intended to replace advice given to you by your health care provider. Make sure you discuss any questions you have with your health care provider. Document Released: 02/23/2015 Document Revised: 10/17/2015 Document Reviewed: 11/28/2014 Elsevier Interactive Patient Education  2017 [place] Prevention in the Home Falls can cause injuries. They can happen to people of all ages. There are many things you can do to make your home safe and to help prevent falls. What can I do on the outside of my home?  Regularly fix the edges of walkways and driveways and fix any cracks.  Remove anything that might make you trip as you walk through a door, such as a raised step or threshold.  Trim any bushes or trees on the path to your home.  Use bright outdoor lighting.  Clear any walking paths of anything that might make someone trip, such as rocks or tools.  Regularly check to see if handrails are loose or broken. Make sure that both sides of any steps have handrails.  Any raised decks and porches should have guardrails on the edges.  Have any leaves, snow, or ice cleared regularly.  Use sand or salt on walking paths during winter.  Clean up any spills in your garage right away. This includes oil or grease spills. What can I do in the bathroom?  Use night lights.  Install grab bars by the toilet and in the tub and shower. Do not use towel bars as grab bars.  Use non-skid mats or decals in the tub or shower.  If you need to sit down in the shower, use a plastic, non-slip stool.  Keep the floor dry. Clean up any water that spills on the floor as soon as it happens.  Remove soap buildup in the tub or shower regularly.  Attach bath mats  securely with double-sided non-slip rug tape.  Do not have throw rugs and other things on the floor that can make you trip. What can I do in the bedroom?  Use night lights.  Make sure that you have a light by your bed that is easy to reach.  Do not use any sheets or blankets that are too big for your bed. They should not hang down onto the floor.  Have a firm chair that has side arms. You can use this for support while you get dressed.  Do not have throw rugs and other things on the floor that can make you trip. What can I do in the kitchen?  Clean up any spills right away.  Avoid walking on wet floors.  Keep items that you use a lot in easy-to-reach places.  If you need to reach something above you, use a strong step stool that has a grab bar.  Keep electrical cords out of the way.  Do not use floor polish or wax that makes floors slippery. If you must use wax, use non-skid floor wax.  Do not have throw rugs and other things on the floor that can make you  trip. What can I do with my stairs?  Do not leave any items on the stairs.  Make sure that there are handrails on both sides of the stairs and use them. Fix handrails that are broken or loose. Make sure that handrails are as long as the stairways.  Check any carpeting to make sure that it is firmly attached to the stairs. Fix any carpet that is loose or worn.  Avoid having throw rugs at the top or bottom of the stairs. If you do have throw rugs, attach them to the floor with carpet tape.  Make sure that you have a light switch at the top of the stairs and the bottom of the stairs. If you do not have them, ask someone to add them for you. What else can I do to help prevent falls?  Wear shoes that:  Do not have high heels.  Have rubber bottoms.  Are comfortable and fit you well.  Are closed at the toe. Do not wear sandals.  If you use a stepladder:  Make sure that it is fully opened. Do not climb a closed  stepladder.  Make sure that both sides of the stepladder are locked into place.  Ask someone to hold it for you, if possible.  Clearly mark and make sure that you can see:  Any grab bars or handrails.  First and last steps.  Where the edge of each step is.  Use tools that help you move around (mobility aids) if they are needed. These include:  Canes.  Walkers.  Scooters.  Crutches.  Turn on the lights when you go into a dark area. Replace any light bulbs as soon as they burn out.  Set up your furniture so you have a clear path. Avoid moving your furniture around.  If any of your floors are uneven, fix them.  If there are any pets around you, be aware of where they are.  Review your medicines with your doctor. Some medicines can make you feel dizzy. This can increase your chance of falling. Ask your doctor what other things that you can do to help prevent falls. This information is not intended to replace advice given to you by your health care provider. Make sure you discuss any questions you have with your health care provider. Document Released: 11/23/2008 Document Revised: 07/05/2015 Document Reviewed: 03/03/2014 Elsevier Interactive Patient Education  2017 [employer].

## 2019-03-24 ENCOUNTER — Other Ambulatory Visit: Payer: Self-pay | Admitting: Pharmacy Technician

## 2019-03-24 DIAGNOSIS — M545 Low back pain: Secondary | ICD-10-CM | POA: Diagnosis not present

## 2019-03-24 DIAGNOSIS — R531 Weakness: Secondary | ICD-10-CM | POA: Diagnosis not present

## 2019-03-24 DIAGNOSIS — R262 Difficulty in walking, not elsewhere classified: Secondary | ICD-10-CM | POA: Diagnosis not present

## 2019-03-24 LAB — CMP14+EGFR
ALT: 17 IU/L (ref 0–32)
AST: 16 IU/L (ref 0–40)
Albumin/Globulin Ratio: 1.7 (ref 1.2–2.2)
Albumin: 4.2 g/dL (ref 3.7–4.7)
Alkaline Phosphatase: 131 IU/L — ABNORMAL HIGH (ref 39–117)
BUN/Creatinine Ratio: 13 (ref 12–28)
BUN: 10 mg/dL (ref 8–27)
Bilirubin Total: 0.5 mg/dL (ref 0.0–1.2)
CO2: 24 mmol/L (ref 20–29)
Calcium: 9.1 mg/dL (ref 8.7–10.3)
Chloride: 103 mmol/L (ref 96–106)
Creatinine, Ser: 0.79 mg/dL (ref 0.57–1.00)
GFR calc Af Amer: 87 mL/min/{1.73_m2} (ref >59)
GFR calc non Af Amer: 76 mL/min/{1.73_m2} (ref >59)
Globulin, Total: 2.5 g/dL (ref 1.5–4.5)
Glucose: 106 mg/dL — ABNORMAL HIGH (ref 65–99)
Potassium: 4.3 mmol/L (ref 3.5–5.2)
Sodium: 142 mmol/L (ref 134–144)
Total Protein: 6.7 g/dL (ref 6.0–8.5)

## 2019-03-24 LAB — HEMOGLOBIN A1C
Est. average glucose Bld gHb Est-mCnc: 131 mg/dL
Hgb A1c MFr Bld: 6.2 % — ABNORMAL HIGH (ref 4.8–5.6)

## 2019-03-24 LAB — LIPID PANEL
Chol/HDL Ratio: 3.2 ratio (ref 0.0–4.4)
Cholesterol, Total: 133 mg/dL (ref 100–199)
HDL: 41 mg/dL (ref >39)
LDL Chol Calc (NIH): 73 mg/dL (ref 0–99)
Triglycerides: 102 mg/dL (ref 0–149)
VLDL Cholesterol Cal: 19 mg/dL (ref 5–40)

## 2019-03-24 NOTE — Patient Outreach (Signed)
[facility]) Care Management  03/24/2019  Sheila Reynolds 01-29-1946 [id number]   Received message from [place] requesting provider portion of application for Lyrica thru [place] be sent to Dr. Baird Cancer for completion.  Maud Deed Chana Bode Primrose Certified Pharmacy Technician [place] [facility] Direct Dial:[phone]

## 2019-03-25 NOTE — Progress Notes (Signed)
This visit occurred during the SARS-CoV-2 public health emergency.  Safety protocols were in place, including screening questions prior to the visit, additional usage of staff PPE, and extensive cleaning of exam room while observing appropriate contact time as indicated for disinfecting solutions.  Subjective:     Patient ID: Sheila Reynolds , female    DOB: [birth year] , 74 y.o.   MRN: [id number]   Chief Complaint  Patient presents with  . Diabetes  . Hypertension    HPI  Diabetes She presents for her follow-up diabetic visit. She has type 2 diabetes mellitus. Her disease course has been stable. There are no hypoglycemic associated symptoms. Pertinent negatives for diabetes include no blurred vision and no chest pain. There are no hypoglycemic complications. Diabetic complications include heart disease. Her weight is stable. Her breakfast blood glucose is taken between 7-8 am. Her breakfast blood glucose range is generally 90-110 mg/dl. An ACE inhibitor/angiotensin II receptor blocker is being taken. Eye exam is current.  Hypertension This is a chronic problem. The current episode started more than 1 year ago. The problem has been gradually improving since onset. The problem is controlled. Pertinent negatives include no blurred vision, chest pain or palpitations. Risk factors for coronary artery disease include diabetes mellitus, dyslipidemia, obesity, post-menopausal state and sedentary lifestyle.     Past Medical History:  Diagnosis Date  . Anemia    3 months ago anemic  . Anxiety    on meds  . Arthritis    "all over" (01/15/2017)  . Asthma   . Bronchitis with emphysema   . Chest pain   . Chronic bronchitis ([place])   . Coronary artery disease    a. 01/2017 she underwent orbital atherectomy/DES to the proxmal LAD and PTCA to ostial D2. 2D Echo 01/15/17 showed mild LVH, EF 60-65%, grade 1 DD.  . Family history of anesthesia complication    daughter N/V  . Fibromyalgia   . GERD  (gastroesophageal reflux disease)    on meds  . Heart murmur   . History of hiatal hernia   . Hx of echocardiogram    Echo (03/2013):  Tech limited; Mild focal basal septal hypertrophy, EF 60-65%, normal RVF  . Hyperlipidemia   . Hypertension   . Nonsustained ventricular tachycardia ([place])   . OSA on CPAP   . Premature atrial contractions   . PVC (premature ventricular contraction)    a. Holter 12/16: NSR, occ PAC,PVCs  . Sarcoidosis   . Type II diabetes mellitus (HCC)      Family History  Problem Relation Age of Onset  . Heart disease Father   . Diabetes Father   . Glaucoma Father   . Cancer Mother        unknown type, ?lung  . Heart disease Paternal Grandmother   . Cancer Paternal Grandmother        unknown type  . Diabetes Brother   . Glaucoma Brother      Current Outpatient Medications:  .  acetaminophen (TYLENOL) 500 MG tablet, Take 1,000 mg by mouth 2 (two) times daily as needed for moderate pain or headache., Disp: , Rfl:  .  albuterol (PROVENTIL) (2.5 MG/3ML) 0.083% nebulizer solution, Take 3 mLs (2.5 mg total) by nebulization daily as needed for wheezing or shortness of breath., Disp: 75 mL, Rfl: 12 .  albuterol (VENTOLIN HFA) 108 (90 Base) MCG/ACT inhaler, Inhale 2 puffs into the lungs every 6 (six) hours as needed for wheezing or shortness of breath., Disp:  1 Inhaler, Rfl: 12 .  atorvastatin (LIPITOR) 80 MG tablet, Take 1 tablet (80 mg total) by mouth daily., Disp: 90 tablet, Rfl: 3 .  brimonidine (ALPHAGAN P) 0.1 % SOLN, Place 1 drop into both eyes 2 (two) times daily. , Disp: , Rfl:  .  brinzolamide (AZOPT) 1 % ophthalmic suspension, Place 1 drop into both eyes 3 (three) times daily. , Disp: , Rfl:  .  carvedilol (COREG) 12.5 MG tablet, TAKE 1 TABLET(12.5 MG) BY MOUTH TWICE DAILY (Patient taking differently: Take 12.5 mg by mouth 2 (two) times daily with a meal. ), Disp: 180 tablet, Rfl: 3 .  clopidogrel (PLAVIX) 75 MG tablet, Take 1 tablet (75 mg total) by mouth  daily., Disp: 90 tablet, Rfl: 3 .  furosemide (LASIX) 40 MG tablet, Take one tablet (40mg)  Each day for 5 days starting on 09/20/18.... then one tablet EVERY OTHER day thereafter. (Patient taking differently: Take 40 mg by mouth daily as needed for fluid. ), Disp: 48 tablet, Rfl: 3 .  isosorbide mononitrate (IMDUR) 60 MG 24 hr tablet, Take 1 tablet (60 mg total) by mouth daily., Disp: 90 tablet, Rfl: 3 .  JANUMET 50-500 MG tablet, TAKE 1 TABLET BY MOUTH TWICE DAILY WITH A MEAL (Patient taking differently: Take 1 tablet by mouth 2 (two) times daily with a meal. ), Disp: 180 tablet, Rfl: 1 .  Lancets (ONETOUCH DELICA PLUS LANCET33G) MISC, CHECK BLOOD SUGAR BEFORE BREAKFAST AND DINNER, Disp: 100 each, Rfl: 1 .  montelukast (SINGULAIR) 10 MG tablet, Take 1 tablet (10 mg total) by mouth every evening., Disp: 90 tablet, Rfl: 1 .  Nebulizers (COMPRESSOR/NEBULIZER) MISC, Use as directed, Disp: 1 each, Rfl: 0 .  nitroGLYCERIN (NITROSTAT) 0.4 MG SL tablet, Place 1 tablet (0.4 mg total) under the tongue every 5 (five) minutes as needed for chest pain., Disp: 25 tablet, Rfl: prn .  olmesartan (BENICAR) 20 MG tablet, Take 20 mg by mouth daily., Disp: , Rfl:  .  pantoprazole (PROTONIX) 40 MG tablet, TAKE 1 TABLET(40 MG) BY MOUTH DAILY, Disp: 30 tablet, Rfl: 7 .  potassium chloride SA (KLOR-CON) 20 MEQ tablet, Take 1 tablet (20 mEq total) by mouth 2 (two) times daily. On the days you take Furosemide (Lasix), Disp: 90 tablet, Rfl: 3 .  prednisoLONE acetate (PRED FORTE) 1 % ophthalmic suspension, Place 1 drop into both eyes 4 (four) times daily. , Disp: , Rfl:  .  pregabalin (LYRICA) 75 MG capsule, Take 1 capsule (75 mg total) by mouth 2 (two) times daily., Disp: 180 capsule, Rfl: 1 .  promethazine (PHENERGAN) 25 MG tablet, Take 1 tablet (25 mg total) by mouth every 6 (six) hours as needed for nausea or vomiting. (Patient not taking: Reported on 01/24/2019), Disp: 30 tablet, Rfl: 0 .  traMADol (ULTRAM) 50 MG tablet,  Take 1 tablet (50 mg total) by mouth every 6 (six) hours as needed. (Patient not taking: Reported on 01/24/2019), Disp: 40 tablet, Rfl: 0 .  travoprost, benzalkonium, (TRAVATAN) 0.004 % ophthalmic solution, Place 1 drop into the right eye at bedtime. , Disp: , Rfl:    Allergies  Allergen Reactions  . Crestor [Rosuvastatin Calcium] Other (See Comments)    muscle aches  . Demerol  [Meperidine Hcl]     Other reaction(s): Hallucinations  . Shellfish Allergy Itching and Other (See Comments)    Crab, shrimp and lobster ---lips itch and tingle Tingling  Was told not to eat again after having a allergy test. Lobster, crab and   shrimp Crab, shrimp and lobster ---lips itch and tingle PATIENT STATES SHE HAD A POSITVE ALLERGY TEST   . Demerol [Meperidine] Other (See Comments)    Hallucinations      Review of Systems  Constitutional: Negative.   Eyes: Negative for blurred vision.  Respiratory: Negative.   Cardiovascular: Negative.  Negative for chest pain and palpitations.  Gastrointestinal: Negative.   Neurological: Negative.   Psychiatric/Behavioral: Negative.      Today's Vitals   03/23/19 1039  BP: 136/78  Pulse: 85  Temp: 97.9 F (36.6 C)  TempSrc: Oral  Weight: 175 lb 9.6 oz (79.7 kg)  Height: 5' 1.6" (1.565 m)  PainSc: 6   PainLoc: Knee   Body mass index is 32.54 kg/m.   Objective:  Physical Exam Vitals and nursing note reviewed.  Constitutional:      Appearance: Normal appearance.  HENT:     Head: Normocephalic and atraumatic.  Cardiovascular:     Rate and Rhythm: Normal rate and regular rhythm.     Heart sounds: Normal heart sounds.  Pulmonary:     Effort: Pulmonary effort is normal.     Breath sounds: Normal breath sounds.  Musculoskeletal:     Right lower leg: Edema present.     Left lower leg: Edema present.  Skin:    General: Skin is warm.  Neurological:     General: No focal deficit present.     Mental Status: She is alert.  Psychiatric:        Mood  and Affect: Mood normal.        Behavior: Behavior normal.         Assessment And Plan:     1. Obesity, diabetes, and hypertension syndrome (HCC)  Chronic, her diabetes and HTN have been  stable. I will check labs as listed below. I will make further recommendations once her labs are available for review.   - CMP14+EGFR - Lipid panel - Hemoglobin A1c  2. Peripheral vascular disease, unspecified (HCC)  Chronic, yet stable. Importance of regular exercise and statin compliance was discussed with the patient.   3. Atherosclerosis of native coronary artery of native heart with angina pectoris (HCC)  Chronic, yet stable. Importance of medication and dietary compliance was discussed with the patient.   4. Class 1 obesity due to excess calories with serious comorbidity and body mass index (BMI) of 32.0 to 32.9 in adult  She is encouraged to strive to lose ten pounds to decrease cardiac risk. She is encouraged to aim for 30 minutes of exercise five days per week.   5. Bilateral lower extremity edema  Chronic, she has had full cardiac workup. She is encouraged to elevate her legs when seated, wear compression hose and to limit her salt intake.    N , MD    THE PATIENT IS ENCOURAGED TO PRACTICE SOCIAL DISTANCING DUE TO THE COVID-19 PANDEMIC.   

## 2019-03-28 DIAGNOSIS — R531 Weakness: Secondary | ICD-10-CM | POA: Diagnosis not present

## 2019-03-28 DIAGNOSIS — M545 Low back pain: Secondary | ICD-10-CM | POA: Diagnosis not present

## 2019-03-28 DIAGNOSIS — R262 Difficulty in walking, not elsewhere classified: Secondary | ICD-10-CM | POA: Diagnosis not present

## 2019-03-30 DIAGNOSIS — R262 Difficulty in walking, not elsewhere classified: Secondary | ICD-10-CM | POA: Diagnosis not present

## 2019-03-30 DIAGNOSIS — M545 Low back pain: Secondary | ICD-10-CM | POA: Diagnosis not present

## 2019-03-30 DIAGNOSIS — R531 Weakness: Secondary | ICD-10-CM | POA: Diagnosis not present

## 2019-04-04 DIAGNOSIS — R531 Weakness: Secondary | ICD-10-CM | POA: Diagnosis not present

## 2019-04-04 DIAGNOSIS — R262 Difficulty in walking, not elsewhere classified: Secondary | ICD-10-CM | POA: Diagnosis not present

## 2019-04-04 DIAGNOSIS — M545 Low back pain: Secondary | ICD-10-CM | POA: Diagnosis not present

## 2019-04-05 ENCOUNTER — Telehealth: Payer: Self-pay

## 2019-04-05 ENCOUNTER — Ambulatory Visit: Payer: Self-pay

## 2019-04-05 DIAGNOSIS — M797 Fibromyalgia: Secondary | ICD-10-CM

## 2019-04-05 DIAGNOSIS — I10 Essential (primary) hypertension: Secondary | ICD-10-CM

## 2019-04-05 DIAGNOSIS — E1165 Type 2 diabetes mellitus with hyperglycemia: Secondary | ICD-10-CM

## 2019-04-06 ENCOUNTER — Other Ambulatory Visit: Payer: Self-pay

## 2019-04-06 ENCOUNTER — Telehealth: Payer: Medicare Other

## 2019-04-06 NOTE — Chronic Care Management (AMB) (Signed)
  Chronic Care Management   Outreach Note  04/06/2019 Name: Sheila Reynolds MRN: [id number] DOB: [birth year]  Referred by: Glendale Chard, MD Reason for referral : Chronic Care Management ([facility] Initial Call - DM, HTN, Fibromyalgia)   An unsuccessful telephone outreach was attempted today. The patient was referred to the case management team for assistance with care management and care coordination.   Follow Up Plan: Telephone follow up appointment with care management team member scheduled for:04/27/19  Barb Merino, RN, BSN, CCM Care Management Coordinator [place] [facility]  Direct Phone: [phone]

## 2019-04-07 DIAGNOSIS — R531 Weakness: Secondary | ICD-10-CM | POA: Diagnosis not present

## 2019-04-07 DIAGNOSIS — M545 Low back pain: Secondary | ICD-10-CM | POA: Diagnosis not present

## 2019-04-07 DIAGNOSIS — R262 Difficulty in walking, not elsewhere classified: Secondary | ICD-10-CM | POA: Diagnosis not present

## 2019-04-08 NOTE — Patient Instructions (Signed)
Visit Information  Goals Addressed            This Visit's Progress     Patient Stated   . I would like to optimize my medication management of my chronic conditions (pt-stated)       Current Barriers:  . Diabetes: T2DM; most recent A1c 6.1% on 07/20/18  . Current antihyperglycemic regimen: Janumet 50/500mg  o Patient assistance application filled out and mailed on 03/23/19; expect to obtain janumet in ~3 months; will ship to patient's home; letter of attestation will be mailed to patient's home in about 73-month that patient will need to mail back in order to obtain drug . denies hypoglycemic symptoms; denies hyperglycemic symptoms . Current meal patterns: discussed diet/lifestyle . Current exercise: n/a; encouraged walking as able . Current blood glucose readings: FBG<130, mostly 90-100 (denies hypoglycemia) . Cardiovascular risk reduction: o Current hypertensive regimen: coreg, isosorbide, olmesartan 20mg  (BP reported to be <130/80) o Current hyperlipidemia regimen: atorvastatin 80mg  daily (11/12/18 #90)  Pharmacist Clinical Goal(s):  [name] Over the next 90 days, patient with work with PharmD and primary care provider to address needs related to optimized medication management of chronic conditions  Interventions: . Comprehensive medication review performed, medication list updated in electronic medical record . Counseled patient on diet/lifestyle  Patient Self Care Activities:  . Patient will check blood glucose 3x weekly in the AM , document, and provide at future appointments . Patient will focus on medication adherence by continuing to take medications as prescribed and apply for financial assistance . Patient will take medications as prescribed . Patient will contact provider with any episodes of hypoglycemia . Patient will report any questions or concerns to provider   Initial goal documentation        The patient verbalized understanding of instructions provided today and  declined a print copy of patient instruction materials.   The care management team will reach out to the patient again over the next 60 days.   SIGNATURE Regina Eck, PharmD, BCPS Clinical Pharmacist, [facility] [place]: [phone]

## 2019-04-08 NOTE — Progress Notes (Signed)
Chronic Care Management    Visit Note  03/23/2019 Name: Sheila Reynolds MRN: [id number] DOB: [birth year]  Referred by: Glendale Chard, MD Reason for referral : No chief complaint on file.   Sheila Reynolds is a 74 y.o. year old female who is a primary care patient of Glendale Chard, MD. The CCM team was consulted for assistance with chronic disease management and care coordination needs related to DMII  Review of patient status, including review of consultants reports, relevant laboratory and other test results, and collaboration with appropriate care team members and the patient's provider was performed as part of comprehensive patient evaluation and provision of chronic care management services.    SDOH (Social Determinants of Health) assessments performed: No See Care Plan activities for detailed interventions related to SDOH)     Medications: Outpatient Encounter Medications as of 03/23/2019  Medication Sig  . acetaminophen (TYLENOL) 500 MG tablet Take 1,000 mg by mouth 2 (two) times daily as needed for moderate pain or headache.  . albuterol (PROVENTIL) (2.5 MG/3ML) 0.083% nebulizer solution Take 3 mLs (2.5 mg total) by nebulization daily as needed for wheezing or shortness of breath.  [name] albuterol (VENTOLIN HFA) 108 (90 Base) MCG/ACT inhaler Inhale 2 puffs into the lungs every 6 (six) hours as needed for wheezing or shortness of breath.  [name] atorvastatin (LIPITOR) 80 MG tablet Take 1 tablet (80 mg total) by mouth daily.  . brimonidine (ALPHAGAN P) 0.1 % SOLN Place 1 drop into both eyes 2 (two) times daily.   . brinzolamide (AZOPT) 1 % ophthalmic suspension Place 1 drop into both eyes 3 (three) times daily.   . carvedilol (COREG) 12.5 MG tablet TAKE 1 TABLET(12.5 MG) BY MOUTH TWICE DAILY (Patient taking differently: Take 12.5 mg by mouth 2 (two) times daily with a meal. )  . clopidogrel (PLAVIX) 75 MG tablet Take 1 tablet (75 mg total) by mouth daily.  . furosemide (LASIX) 40 MG  tablet Take one tablet (40mg )  Each day for 5 days starting on 09/20/18.... then one tablet EVERY OTHER day thereafter. (Patient taking differently: Take 40 mg by mouth daily as needed for fluid. )  . isosorbide mononitrate (IMDUR) 60 MG 24 hr tablet Take 1 tablet (60 mg total) by mouth daily.  [name] JANUMET 50-500 MG tablet TAKE 1 TABLET BY MOUTH TWICE DAILY WITH A MEAL (Patient taking differently: Take 1 tablet by mouth 2 (two) times daily with a meal. )  . Lancets (ONETOUCH DELICA PLUS 123XX123) MISC CHECK BLOOD SUGAR BEFORE BREAKFAST AND DINNER  . montelukast (SINGULAIR) 10 MG tablet Take 1 tablet (10 mg total) by mouth every evening.  . Nebulizers (COMPRESSOR/NEBULIZER) MISC Use as directed  . nitroGLYCERIN (NITROSTAT) 0.4 MG SL tablet Place 1 tablet (0.4 mg total) under the tongue every 5 (five) minutes as needed for chest pain.  [name] olmesartan (BENICAR) 20 MG tablet Take 20 mg by mouth daily.  . pantoprazole (PROTONIX) 40 MG tablet TAKE 1 TABLET(40 MG) BY MOUTH DAILY  . potassium chloride SA (KLOR-CON) 20 MEQ tablet Take 1 tablet (20 mEq total) by mouth 2 (two) times daily. On the days you take Furosemide (Lasix)  . prednisoLONE acetate (PRED FORTE) 1 % ophthalmic suspension Place 1 drop into both eyes 4 (four) times daily.   . pregabalin (LYRICA) 75 MG capsule Take 1 capsule (75 mg total) by mouth 2 (two) times daily.  . promethazine (PHENERGAN) 25 MG tablet Take 1 tablet (25 mg total) by mouth every 6 (six)  hours as needed for nausea or vomiting. (Patient not taking: Reported on 01/24/2019)  . traMADol (ULTRAM) 50 MG tablet Take 1 tablet (50 mg total) by mouth every 6 (six) hours as needed. (Patient not taking: Reported on 01/24/2019)  . travoprost, benzalkonium, (TRAVATAN) 0.004 % ophthalmic solution Place 1 drop into the right eye at bedtime.    No facility-administered encounter medications on file as of 03/23/2019.     Objective:   Goals Addressed            This Visit's Progress       Patient Stated   . I would like to optimize my medication management of my chronic conditions (pt-stated)       Current Barriers:  . Diabetes: T2DM; most recent A1c 6.1% on 07/20/18  . Current antihyperglycemic regimen: Janumet 50/500mg  o Patient assistance application filled out and mailed on 03/23/19; expect to obtain janumet in ~3 months; will ship to patient's home; letter of attestation will be mailed to patient's home in about 36-month that patient will need to mail back in order to obtain drug . denies hypoglycemic symptoms; denies hyperglycemic symptoms . Current meal patterns: discussed diet/lifestyle . Current exercise: n/a; encouraged walking as able . Current blood glucose readings: FBG<130, mostly 90-100 (denies hypoglycemia) . Cardiovascular risk reduction: o Current hypertensive regimen: coreg, isosorbide, olmesartan 20mg  (BP reported to be <130/80) o Current hyperlipidemia regimen: atorvastatin 80mg  daily (11/12/18 #90)  Pharmacist Clinical Goal(s):  [name] Over the next 90 days, patient with work with PharmD and primary care provider to address needs related to optimized medication management of chronic conditions  Interventions: . Comprehensive medication review performed, medication list updated in electronic medical record . Counseled patient on diet/lifestyle  Patient Self Care Activities:  . Patient will check blood glucose 3x weekly in the AM , document, and provide at future appointments . Patient will focus on medication adherence by continuing to take medications as prescribed and apply for financial assistance . Patient will take medications as prescribed . Patient will contact provider with any episodes of hypoglycemia . Patient will report any questions or concerns to provider   Initial goal documentation         Plan:   The care management team will reach out to the patient again over the next 60 days.   Provider Signature Regina Eck, PharmD,  BCPS Clinical Pharmacist, [facility] [place]: [phone]

## 2019-04-11 DIAGNOSIS — R262 Difficulty in walking, not elsewhere classified: Secondary | ICD-10-CM | POA: Diagnosis not present

## 2019-04-11 DIAGNOSIS — R531 Weakness: Secondary | ICD-10-CM | POA: Diagnosis not present

## 2019-04-11 DIAGNOSIS — M545 Low back pain: Secondary | ICD-10-CM | POA: Diagnosis not present

## 2019-04-18 DIAGNOSIS — R262 Difficulty in walking, not elsewhere classified: Secondary | ICD-10-CM | POA: Diagnosis not present

## 2019-04-18 DIAGNOSIS — R531 Weakness: Secondary | ICD-10-CM | POA: Diagnosis not present

## 2019-04-18 DIAGNOSIS — M545 Low back pain: Secondary | ICD-10-CM | POA: Diagnosis not present

## 2019-04-21 DIAGNOSIS — M545 Low back pain: Secondary | ICD-10-CM | POA: Diagnosis not present

## 2019-04-21 DIAGNOSIS — R262 Difficulty in walking, not elsewhere classified: Secondary | ICD-10-CM | POA: Diagnosis not present

## 2019-04-21 DIAGNOSIS — M25562 Pain in left knee: Secondary | ICD-10-CM | POA: Diagnosis not present

## 2019-04-21 DIAGNOSIS — R531 Weakness: Secondary | ICD-10-CM | POA: Diagnosis not present

## 2019-04-26 DIAGNOSIS — M21611 Bunion of right foot: Secondary | ICD-10-CM | POA: Diagnosis not present

## 2019-04-26 DIAGNOSIS — G5761 Lesion of plantar nerve, right lower limb: Secondary | ICD-10-CM | POA: Diagnosis not present

## 2019-04-26 DIAGNOSIS — M6701 Short Achilles tendon (acquired), right ankle: Secondary | ICD-10-CM | POA: Diagnosis not present

## 2019-04-26 DIAGNOSIS — M7731 Calcaneal spur, right foot: Secondary | ICD-10-CM | POA: Diagnosis not present

## 2019-04-26 DIAGNOSIS — E139 Other specified diabetes mellitus without complications: Secondary | ICD-10-CM | POA: Diagnosis not present

## 2019-04-26 DIAGNOSIS — G5762 Lesion of plantar nerve, left lower limb: Secondary | ICD-10-CM | POA: Diagnosis not present

## 2019-04-27 ENCOUNTER — Other Ambulatory Visit: Payer: Self-pay

## 2019-04-27 ENCOUNTER — Ambulatory Visit: Payer: Self-pay

## 2019-04-27 ENCOUNTER — Telehealth: Payer: Medicare Other

## 2019-04-27 DIAGNOSIS — M797 Fibromyalgia: Secondary | ICD-10-CM

## 2019-04-27 DIAGNOSIS — R531 Weakness: Secondary | ICD-10-CM | POA: Diagnosis not present

## 2019-04-27 DIAGNOSIS — I10 Essential (primary) hypertension: Secondary | ICD-10-CM

## 2019-04-27 DIAGNOSIS — R262 Difficulty in walking, not elsewhere classified: Secondary | ICD-10-CM | POA: Diagnosis not present

## 2019-04-27 DIAGNOSIS — E1165 Type 2 diabetes mellitus with hyperglycemia: Secondary | ICD-10-CM

## 2019-04-27 DIAGNOSIS — M545 Low back pain: Secondary | ICD-10-CM | POA: Diagnosis not present

## 2019-04-28 NOTE — Chronic Care Management (AMB) (Signed)
  Chronic Care Management   Outreach Note  04/28/2019 Name: Sheila Reynolds MRN: [id number] DOB: [birth year]  Referred by: Glendale Chard, MD Reason for referral : Chronic Care Management (RQ #2 Initial Call - DM/HTN)   A second unsuccessful telephone outreach was attempted today. The patient was referred to the case management team for assistance with care management and care coordination.   Follow Up Plan: Telephone follow up appointment with care management team member scheduled for: 05/16/19   Barb Merino, RN, BSN, CCM Care Management Coordinator [facility]  Direct Phone: [phone]

## 2019-05-02 DIAGNOSIS — R531 Weakness: Secondary | ICD-10-CM | POA: Diagnosis not present

## 2019-05-02 DIAGNOSIS — M545 Low back pain: Secondary | ICD-10-CM | POA: Diagnosis not present

## 2019-05-02 DIAGNOSIS — R262 Difficulty in walking, not elsewhere classified: Secondary | ICD-10-CM | POA: Diagnosis not present

## 2019-05-04 ENCOUNTER — Other Ambulatory Visit: Payer: Self-pay | Admitting: Pharmacy Technician

## 2019-05-04 NOTE — Patient Outreach (Signed)
[facility]) Care Management  05/04/2019  Sheila Reynolds [birth year] [id number]   Follow up call placed to [employer] regarding patient assistance application(s) for Janumet and Proventil HFA , Sunday Spillers confirms patient assistance application was received. Attestation form mailed out to patient today, 3/24 for completion.   Unsuccessful call placed to patient regarding patient assistance application(s) for Janumet and Proventil HFA , unable to leave voicemail.   Follow up:  Will make additional call attempt in 3-5 business days  Maud Deed. Chana Bode, [place] Certified Pharmacy Technician [employer]

## 2019-05-06 ENCOUNTER — Other Ambulatory Visit (HOSPITAL_COMMUNITY): Payer: Self-pay | Admitting: Family Medicine

## 2019-05-06 ENCOUNTER — Other Ambulatory Visit: Payer: Self-pay

## 2019-05-06 ENCOUNTER — Ambulatory Visit (HOSPITAL_COMMUNITY)
Admission: RE | Admit: 2019-05-06 | Discharge: 2019-05-06 | Disposition: A | Discharge status: Home / Self Care | Payer: Medicare Other | Source: Ambulatory Visit | Attending: Family Medicine | Admitting: Family Medicine

## 2019-05-06 DIAGNOSIS — M7989 Other specified soft tissue disorders: Secondary | ICD-10-CM

## 2019-05-06 DIAGNOSIS — M79605 Pain in left leg: Secondary | ICD-10-CM | POA: Insufficient documentation

## 2019-05-06 DIAGNOSIS — M79662 Pain in left lower leg: Secondary | ICD-10-CM | POA: Diagnosis not present

## 2019-05-06 NOTE — Progress Notes (Signed)
Left lower extremity venous duplex exam completed.  Preliminary results can be found under CV proc under chart review.  05/06/2019 4:57 PM  Hillari Zumwalt, K., RDMS, RVT

## 2019-05-09 DIAGNOSIS — M545 Low back pain: Secondary | ICD-10-CM | POA: Diagnosis not present

## 2019-05-09 DIAGNOSIS — R262 Difficulty in walking, not elsewhere classified: Secondary | ICD-10-CM | POA: Diagnosis not present

## 2019-05-09 DIAGNOSIS — R531 Weakness: Secondary | ICD-10-CM | POA: Diagnosis not present

## 2019-05-11 DIAGNOSIS — R262 Difficulty in walking, not elsewhere classified: Secondary | ICD-10-CM | POA: Diagnosis not present

## 2019-05-11 DIAGNOSIS — R531 Weakness: Secondary | ICD-10-CM | POA: Diagnosis not present

## 2019-05-11 DIAGNOSIS — M545 Low back pain: Secondary | ICD-10-CM | POA: Diagnosis not present

## 2019-05-16 ENCOUNTER — Other Ambulatory Visit: Payer: Self-pay

## 2019-05-16 ENCOUNTER — Ambulatory Visit (INDEPENDENT_AMBULATORY_CARE_PROVIDER_SITE_OTHER): Payer: Medicare Other

## 2019-05-16 ENCOUNTER — Telehealth: Payer: Medicare Other

## 2019-05-16 DIAGNOSIS — E1165 Type 2 diabetes mellitus with hyperglycemia: Secondary | ICD-10-CM | POA: Diagnosis not present

## 2019-05-16 DIAGNOSIS — I10 Essential (primary) hypertension: Secondary | ICD-10-CM

## 2019-05-16 DIAGNOSIS — M797 Fibromyalgia: Secondary | ICD-10-CM

## 2019-05-16 DIAGNOSIS — E559 Vitamin D deficiency, unspecified: Secondary | ICD-10-CM

## 2019-05-16 DIAGNOSIS — I739 Peripheral vascular disease, unspecified: Secondary | ICD-10-CM

## 2019-05-17 DIAGNOSIS — L603 Nail dystrophy: Secondary | ICD-10-CM | POA: Diagnosis not present

## 2019-05-17 DIAGNOSIS — M6701 Short Achilles tendon (acquired), right ankle: Secondary | ICD-10-CM | POA: Diagnosis not present

## 2019-05-17 DIAGNOSIS — R609 Edema, unspecified: Secondary | ICD-10-CM | POA: Diagnosis not present

## 2019-05-17 DIAGNOSIS — I872 Venous insufficiency (chronic) (peripheral): Secondary | ICD-10-CM | POA: Diagnosis not present

## 2019-05-17 DIAGNOSIS — E139 Other specified diabetes mellitus without complications: Secondary | ICD-10-CM | POA: Diagnosis not present

## 2019-05-19 NOTE — Patient Instructions (Signed)
Visit Information  Goals Addressed      Patient Stated   . "I had a fall and injured my leg" (pt-stated)       CARE PLAN ENTRY (see longtitudinal plan of care for additional care plan information)  Current Barriers:  [name] Knowledge Deficits related to  . Chronic Disease Management support and education needs related to Uncontrolled type two DM with hyperglycemia, Vitamin D deficiency, Fibromyalgia, Hypertension  Nurse Case Manager Clinical Goal(s):  [name] Over the next 90 days, patient will work with CCM RN CM and PCP to address needs related to evaluate and treat L hip and leg pain  . Over the next 90 days, patient will verbalize having improved mobility and decreased pain to her L hip and leg  CCM RN CM Interventions:  05/17/19 call completed with patient  . Evaluation of current treatment plan related to recent fall with injury to L hip and leg and patient's adherence to plan as established by provider . Determined patient's Podiatrist placed a cast to her L lower leg due to patient experiencing increased pain and edema to this extremity following a recent fall  . Determined patient tripped on a curb in a parking lot; Determined her xray was negative for fracture following the fall, however her L hip and lower leg continued to be painful and edematous . Determined patient underwent recent vascular doppler to assess for DVT, this test was negative for blood clots . Encouraged patient to use her cane at all times for support and to take her time when changing positions; Encouraged to wear good supportive shoes and avoid waling on uneven ground if possible . Discussed plans with patient for ongoing care management follow up and provided patient with direct contact information for care management team  Patient Self Care Activities:  . Self administers medications as prescribed . Attends all scheduled provider appointments . Calls pharmacy for medication refills . Performs ADL's  independently . Performs IADL's independently . Calls provider office for new concerns or questions  Initial goal documentation     . COMPLETED: I need help getting my medications (pt-stated)       Current Barriers:  . Financial Barriers: patient has Medicare insurance and reports copay for Janumet, lyrica & inhalers is cost prohibitive at this time  Pharmacist Clinical Goal(s):  [name] Over the next 30 days, patient will work with PharmD and providers to relieve medication access concerns  Interventions: . Comprehensive medication review completed; medication list updated in electronic medical record.  . Patient meets income/NO out of pocket spend criteria for this medication's patient assistance program. Reviewed application process. Patient will provide proof of income, out of pocket spend report, and will sign application. Will collaborate with primary care provider, Dr. Glendale Chard, for their portion of application. Once completed, will submit to patient assistance program.  Patient Self Care Activities:  . Patient will provide necessary portions of application   Initial goal documentation     . COMPLETED: I would like to optimize my medication management of my chronic conditions (pt-stated)       Current Barriers:  . Diabetes: T2DM; most recent A1c 6.1% on 07/20/18  . Current antihyperglycemic regimen: Janumet 50/500mg  o Patient assistance application filled out and mailed on 03/23/19; expect to obtain janumet in ~3 months; will ship to patient's home; letter of attestation will be mailed to patient's home in about 9-month that patient will need to mail back in order to obtain drug . denies hypoglycemic symptoms;  denies hyperglycemic symptoms . Current meal patterns: discussed diet/lifestyle . Current exercise: n/a; encouraged walking as able . Current blood glucose readings: FBG<130, mostly 90-100 (denies hypoglycemia) . Cardiovascular risk reduction: o Current hypertensive  regimen: coreg, isosorbide, olmesartan 20mg  (BP reported to be <130/80) o Current hyperlipidemia regimen: atorvastatin 80mg  daily (11/12/18 #90)  Pharmacist Clinical Goal(s):  [name] Over the next 90 days, patient with work with PharmD and primary care provider to address needs related to optimized medication management of chronic conditions  Interventions: . Comprehensive medication review performed, medication list updated in electronic medical record . Counseled patient on diet/lifestyle  Patient Self Care Activities:  . Patient will check blood glucose 3x weekly in the AM , document, and provide at future appointments . Patient will focus on medication adherence by continuing to take medications as prescribed and apply for financial assistance . Patient will take medications as prescribed . Patient will contact provider with any episodes of hypoglycemia . Patient will report any questions or concerns to provider   Initial goal documentation       Other   . "I need to have this hernia evaluated"       CARE PLAN ENTRY (see longtitudinal plan of care for additional care plan information)  Current Barriers:  [name] Knowledge Deficits related to evaluation and treatment of Hiatal Hernia  . Chronic Disease Management support and education needs related to Uncontrolled type two DM with hyperglycemia, Vitamin D deficiency, Fibromyalgia, Hypertension, Peripheral Vascular disease  Nurse Case Manager Clinical Goal(s):  [name] Over the next 90 days, patient will work with CCM RN CM and PCP  to address needs related to evaluation and treatment for Hiatal Hernia   CCM RN CM Interventions:  05/17/19 call completed with patient  . Evaluation of current treatment plan related to Hiatal Hernia and patient's adherence to plan as established by provider . Determined patient has a hiatal hernia that is enlarged causing her pain and shortness of breath at times . Discussed she would like for this to be evaluated  and treated . Collaborated with Dr. Baird Cancer via in basket message regarding patient's c/o enlarged hiatal hernia, causing her discomfort and shortness of breath at times . Discussed plans with patient for ongoing care management follow up and provided patient with direct contact information for care management team  Patient Self Care Activities:  . Self administers medications as prescribed . Attends all scheduled provider appointments . Calls pharmacy for medication refills . Performs ADL's independently . Performs IADL's independently . Calls provider office for new concerns or questions  Initial goal documentation     . "I would like to keep my diabetes under control"       CARE PLAN ENTRY (see longtitudinal plan of care for additional care plan information)  Current Barriers:  [name] Knowledge Deficits related to disease process and Self Health management of diabetes . Chronic Disease Management support and education needs related to Uncontrolled type two DM with hyperglycemia, Vitamin D deficiency, Fibromyalgia, Hypertension, Peripheral Vascular disease  Nurse Case Manager Clinical Goal(s):  [name] Over the next 90 days, patient will work with CCM RN CM and PCP  to address needs related to disease education and support to improve Self Health management of DM   CCM RN CM Interventions:  05/17/19 call completed with patient  . Evaluation of current treatment plan related to Diabetes and patient's adherence to plan as established by provider. . Provided education to patient re: current A1c 6.2% obtained on 03/23/19;  discussed target A1c; Educated on how to maintain and control DM with adherence to exercise, diet and taking medications exactly as prescribed; Educated on daily glycemic control, FBS 80-130, <180 after meals; patient feels satisfied with her current DM treatment plan   . Reviewed medications with patient and discussed indication, dosage and frequency of Janumet 50-500 mg bid with  meals  . Discussed plans with patient for ongoing care management follow up and provided patient with direct contact information for care management team . Provided patient with printed educational materials related to Diabetes Management with Meal Planning; Diabetes Zone Safety Tool; Carb Counting; Carb Choices . Advised patient, providing education and rationale, to check cbg daily before meals and record, calling CCM RN CM and or PCP for findings outside established parameters.    Patient Self Care Activities:  . Self administers medications as prescribed . Attends all scheduled provider appointments . Calls pharmacy for medication refills . Performs ADL's independently . Performs IADL's independently . Calls provider office for new concerns or questions  Initial goal documentation     . COMPLETED: Assist with care coordination related to referral for Janumet       Current Barriers:  . Unknown at this time  Clinical Social Work Clinical Goal(s):  [name] Over the next 45 days the patient will work with embedded PharmD to address concerns related to [facility] prescription.  CCM SW Interventions: Completed 11/23/2018 . Referral received from patient provider Dr. Glendale Chard indicating assistance is needed with medication Janumet . Performed chart review to conclude the patient enrolled in case management program on October 21 2018. At the time of enrollment, patient did not indicate medication concerns therefore embedded PharmD was not involved . Communication with embedded PharmD to request patient outreach to provide assistance . Closed referral considering the patient is already active with CCM program  Patient Self Care Activities:  . Self administers medications as prescribed . Performs ADL's independently . Performs IADL's independently  Initial goal documentation     . COMPLETED: Assist with Chronic Care Management and Care Coordination needs       Current Barriers:   [name] Knowledge Barriers related to resources and support available to address needs related to Chronic Care Management and Care Coordination needs  Case Manager Clinical Goal(s):  [name] Over the next 30 days, patient will work with the CCM team to address needs related to Chronic Care Management and Care Coordination needs  Interventions:  . Collaborated with BSW and initiated plan of care to address needs related to Chronic disease management  Patient Self Care Activities:  . Patient currently unable to independently manage chronic conditions   Initial goal documentation        Patient verbalizes understanding of instructions provided today.   The care management team will reach out to the patient again over the next 14-21 days.  Follow up with provider re: evaluation of hiatal hernia  Barb Merino, RN, BSN, CCM Care Management Coordinator [facility]  Direct Phone: [phone]

## 2019-05-19 NOTE — Chronic Care Management (AMB) (Signed)
Chronic Care Management   Follow Up Note   05/17/2019 Name: Sheila Reynolds MRN: [id number] DOB: May 02, 1945  Referred by: Glendale Chard, MD Reason for referral : Chronic Care Management (RQ #3 Initial RN Call - DM/HTN/Fibromyalgia)   Sheila Reynolds is a 74 y.o. year old female who is a primary care patient of Glendale Chard, MD. The CCM team was consulted for assistance with chronic disease management and care coordination needs.    Review of patient status, including review of consultants reports, relevant laboratory and other test results, and collaboration with appropriate care team members and the patient's provider was performed as part of comprehensive patient evaluation and provision of chronic care management services.    SDOH (Social Determinants of Health) assessments performed: Yes - no SDOH needs identified at this time  See Care Plan activities for detailed interventions related to [place])   Placed outbound initial CCM RN CM call to patient to assess for CCM needs, a care plan was established.     Outpatient Encounter Medications as of 05/16/2019  Medication Sig  . JANUMET 50-500 MG tablet TAKE 1 TABLET BY MOUTH TWICE DAILY WITH A MEAL (Patient taking differently: Take 1 tablet by mouth 2 (two) times daily with a meal. )  . acetaminophen (TYLENOL) 500 MG tablet Take 1,000 mg by mouth 2 (two) times daily as needed for moderate pain or headache.  . albuterol (PROVENTIL) (2.5 MG/3ML) 0.083% nebulizer solution Take 3 mLs (2.5 mg total) by nebulization daily as needed for wheezing or shortness of breath.  [name] albuterol (VENTOLIN HFA) 108 (90 Base) MCG/ACT inhaler Inhale 2 puffs into the lungs every 6 (six) hours as needed for wheezing or shortness of breath.  [name] atorvastatin (LIPITOR) 80 MG tablet Take 1 tablet (80 mg total) by mouth daily.  . brimonidine (ALPHAGAN P) 0.1 % SOLN Place 1 drop into both eyes 2 (two) times daily.   . brinzolamide (AZOPT) 1 % ophthalmic  suspension Place 1 drop into both eyes 3 (three) times daily.   . carvedilol (COREG) 12.5 MG tablet TAKE 1 TABLET(12.5 MG) BY MOUTH TWICE DAILY (Patient taking differently: Take 12.5 mg by mouth 2 (two) times daily with a meal. )  . clopidogrel (PLAVIX) 75 MG tablet Take 1 tablet (75 mg total) by mouth daily.  . furosemide (LASIX) 40 MG tablet Take one tablet (40mg )  Each day for 5 days starting on 09/20/18.... then one tablet EVERY OTHER day thereafter. (Patient taking differently: Take 40 mg by mouth daily as needed for fluid. )  . isosorbide mononitrate (IMDUR) 60 MG 24 hr tablet Take 1 tablet (60 mg total) by mouth daily.  . Lancets (ONETOUCH DELICA PLUS [id number]) MISC CHECK BLOOD SUGAR BEFORE BREAKFAST AND DINNER  . montelukast (SINGULAIR) 10 MG tablet Take 1 tablet (10 mg total) by mouth every evening.  . Nebulizers (COMPRESSOR/NEBULIZER) MISC Use as directed  . nitroGLYCERIN (NITROSTAT) 0.4 MG SL tablet Place 1 tablet (0.4 mg total) under the tongue every 5 (five) minutes as needed for chest pain.  [name] olmesartan (BENICAR) 20 MG tablet Take 20 mg by mouth daily.  . pantoprazole (PROTONIX) 40 MG tablet TAKE 1 TABLET(40 MG) BY MOUTH DAILY  . potassium chloride SA (KLOR-CON) 20 MEQ tablet Take 1 tablet (20 mEq total) by mouth 2 (two) times daily. On the days you take Furosemide (Lasix)  . prednisoLONE acetate (PRED FORTE) 1 % ophthalmic suspension Place 1 drop into both eyes 4 (four) times daily.   [name]  pregabalin (LYRICA) 75 MG capsule Take 1 capsule (75 mg total) by mouth 2 (two) times daily.  . promethazine (PHENERGAN) 25 MG tablet Take 1 tablet (25 mg total) by mouth every 6 (six) hours as needed for nausea or vomiting. (Patient not taking: Reported on 01/24/2019)  . traMADol (ULTRAM) 50 MG tablet Take 1 tablet (50 mg total) by mouth every 6 (six) hours as needed. (Patient not taking: Reported on 01/24/2019)  . travoprost, benzalkonium, (TRAVATAN) 0.004 % ophthalmic solution Place 1 drop into the  right eye at bedtime.    No facility-administered encounter medications on file as of 05/16/2019.     Objective:  Lab Results  Component Value Date   HGBA1C 6.2 (H) 03/23/2019   HGBA1C 6.1 (H) 11/23/2018   HGBA1C 6.1 (H) 07/20/2018   Lab Results  Component Value Date   MICROALBUR 10 03/23/2019   LDLCALC 73 03/23/2019   CREATININE 0.79 03/23/2019   BP Readings from Last 3 Encounters:  03/23/19 136/78  03/23/19 136/78  01/24/19 127/80    Goals Addressed      Patient Stated   . "I had a fall and injured my leg" (pt-stated)       CARE PLAN ENTRY (see longtitudinal plan of care for additional care plan information)  Current Barriers:  [name] Knowledge Deficits related to  . Chronic Disease Management support and education needs related to Uncontrolled type two DM with hyperglycemia, Vitamin D deficiency, Fibromyalgia, Hypertension  Nurse Case Manager Clinical Goal(s):  [name] Over the next 90 days, patient will work with CCM RN CM and PCP to address needs related to evaluate and treat L hip and leg pain  . Over the next 90 days, patient will verbalize having improved mobility and decreased pain to her L hip and leg  CCM RN CM Interventions:  05/17/19 call completed with patient  . Evaluation of current treatment plan related to recent fall with injury to L hip and leg and patient's adherence to plan as established by provider . Determined patient's Podiatrist placed a cast to her L lower leg due to patient experiencing increased pain and edema to this extremity following a recent fall  . Determined patient tripped on a curb in a parking lot; Determined her xray was negative for fracture following the fall, however her L hip and lower leg continued to be painful and edematous . Determined patient underwent recent vascular doppler to assess for DVT, this test was negative for blood clots . Encouraged patient to use her cane at all times for support and to take her time when changing  positions; Encouraged to wear good supportive shoes and avoid waling on uneven ground if possible . Discussed plans with patient for ongoing care management follow up and provided patient with direct contact information for care management team  Patient Self Care Activities:  . Self administers medications as prescribed . Attends all scheduled provider appointments . Calls pharmacy for medication refills . Performs ADL's independently . Performs IADL's independently . Calls provider office for new concerns or questions  Initial goal documentation     . COMPLETED: I need help getting my medications (pt-stated)       Current Barriers:  . Financial Barriers: patient has Medicare insurance and reports copay for Janumet, lyrica & inhalers is cost prohibitive at this time  Pharmacist Clinical Goal(s):  [name] Over the next 30 days, patient will work with PharmD and providers to relieve medication access concerns  Interventions: . Comprehensive medication review completed;  medication list updated in electronic medical record.  . Patient meets income/NO out of pocket spend criteria for this medication's patient assistance program. Reviewed application process. Patient will provide proof of income, out of pocket spend report, and will sign application. Will collaborate with primary care provider, Dr. Glendale Chard, for their portion of application. Once completed, will submit to patient assistance program.  Patient Self Care Activities:  . Patient will provide necessary portions of application   Initial goal documentation     . COMPLETED: I would like to optimize my medication management of my chronic conditions (pt-stated)       Current Barriers:  . Diabetes: T2DM; most recent A1c 6.1% on 07/20/18  . Current antihyperglycemic regimen: Janumet 50/500mg  o Patient assistance application filled out and mailed on 03/23/19; expect to obtain janumet in ~3 months; will ship to patient's home; letter of  attestation will be mailed to patient's home in about 52-month that patient will need to mail back in order to obtain drug . denies hypoglycemic symptoms; denies hyperglycemic symptoms . Current meal patterns: discussed diet/lifestyle . Current exercise: n/a; encouraged walking as able . Current blood glucose readings: FBG<130, mostly 90-100 (denies hypoglycemia) . Cardiovascular risk reduction: o Current hypertensive regimen: coreg, isosorbide, olmesartan 20mg  (BP reported to be <130/80) o Current hyperlipidemia regimen: atorvastatin 80mg  daily (11/12/18 #90)  Pharmacist Clinical Goal(s):  [name] Over the next 90 days, patient with work with PharmD and primary care provider to address needs related to optimized medication management of chronic conditions  Interventions: . Comprehensive medication review performed, medication list updated in electronic medical record . Counseled patient on diet/lifestyle  Patient Self Care Activities:  . Patient will check blood glucose 3x weekly in the AM , document, and provide at future appointments . Patient will focus on medication adherence by continuing to take medications as prescribed and apply for financial assistance . Patient will take medications as prescribed . Patient will contact provider with any episodes of hypoglycemia . Patient will report any questions or concerns to provider   Initial goal documentation       Other   . "I need to have this hernia evaluated"       CARE PLAN ENTRY (see longtitudinal plan of care for additional care plan information)  Current Barriers:  [name] Knowledge Deficits related to evaluation and treatment of Hiatal Hernia  . Chronic Disease Management support and education needs related to Uncontrolled type two DM with hyperglycemia, Vitamin D deficiency, Fibromyalgia, Hypertension, Peripheral Vascular disease  Nurse Case Manager Clinical Goal(s):  [name] Over the next 90 days, patient will work with CCM RN CM and PCP   to address needs related to evaluation and treatment for Hiatal Hernia   CCM RN CM Interventions:  05/17/19 call completed with patient  . Evaluation of current treatment plan related to Hiatal Hernia and patient's adherence to plan as established by provider . Determined patient has a hiatal hernia that is enlarged causing her pain and shortness of breath at times . Discussed she would like for this to be evaluated and treated . Collaborated with Dr. Baird Cancer via in basket message regarding patient's c/o enlarged hiatal hernia, causing her discomfort and shortness of breath at times . Discussed plans with patient for ongoing care management follow up and provided patient with direct contact information for care management team  Patient Self Care Activities:  . Self administers medications as prescribed . Attends all scheduled provider appointments . Calls pharmacy for medication refills .  Performs ADL's independently . Performs IADL's independently . Calls provider office for new concerns or questions  Initial goal documentation     . "I would like to keep my diabetes under control"       CARE PLAN ENTRY (see longtitudinal plan of care for additional care plan information)  Current Barriers:  [name] Knowledge Deficits related to disease process and Self Health management of diabetes . Chronic Disease Management support and education needs related to Uncontrolled type two DM with hyperglycemia, Vitamin D deficiency, Fibromyalgia, Hypertension, Peripheral Vascular disease  Nurse Case Manager Clinical Goal(s):  [name] Over the next 90 days, patient will work with CCM RN CM and PCP  to address needs related to disease education and support to improve Self Health management of DM   CCM RN CM Interventions:  05/17/19 call completed with patient  . Evaluation of current treatment plan related to Diabetes and patient's adherence to plan as established by provider. . Provided education to patient re:  current A1c 6.2% obtained on 03/23/19; discussed target A1c; Educated on how to maintain and control DM with adherence to exercise, diet and taking medications exactly as prescribed; Educated on daily glycemic control, FBS 80-130, <180 after meals; patient feels satisfied with her current DM treatment plan   . Reviewed medications with patient and discussed indication, dosage and frequency of Janumet 50-500 mg bid with meals  . Discussed plans with patient for ongoing care management follow up and provided patient with direct contact information for care management team . Provided patient with printed educational materials related to Diabetes Management with Meal Planning; Diabetes Zone Safety Tool; Carb Counting; Carb Choices . Advised patient, providing education and rationale, to check cbg daily before meals and record, calling CCM RN CM and or PCP for findings outside established parameters.    Patient Self Care Activities:  . Self administers medications as prescribed . Attends all scheduled provider appointments . Calls pharmacy for medication refills . Performs ADL's independently . Performs IADL's independently . Calls provider office for new concerns or questions  Initial goal documentation     . COMPLETED: Assist with care coordination related to referral for Janumet       Current Barriers:  . Unknown at this time  Clinical Social Work Clinical Goal(s):  [name] Over the next 45 days the patient will work with embedded PharmD to address concerns related to [facility] prescription.  CCM SW Interventions: Completed 11/23/2018 . Referral received from patient provider Dr. Glendale Chard indicating assistance is needed with medication Janumet . Performed chart review to conclude the patient enrolled in case management program on October 21 2018. At the time of enrollment, patient did not indicate medication concerns therefore embedded PharmD was not involved . Communication with embedded  PharmD to request patient outreach to provide assistance . Closed referral considering the patient is already active with CCM program  Patient Self Care Activities:  . Self administers medications as prescribed . Performs ADL's independently . Performs IADL's independently  Initial goal documentation     . COMPLETED: Assist with Chronic Care Management and Care Coordination needs       Current Barriers:  [name] Knowledge Barriers related to resources and support available to address needs related to Chronic Care Management and Care Coordination needs  Case Manager Clinical Goal(s):  [name] Over the next 30 days, patient will work with the CCM team to address needs related to Chronic Care Management and Care Coordination needs  Interventions:  . Collaborated with  BSW and initiated plan of care to address needs related to Chronic disease management  Patient Self Care Activities:  . Patient currently unable to independently manage chronic conditions   Initial goal documentation         Plan:   The care management team will reach out to the patient again over the next 14-21 days days.  Follow up with provider re: evaluation of hiatal hernia   [place], RN, BSN, CCM Care Management Coordinator [facility]  Direct Phone: [phone]

## 2019-05-25 DIAGNOSIS — M6701 Short Achilles tendon (acquired), right ankle: Secondary | ICD-10-CM | POA: Diagnosis not present

## 2019-05-25 DIAGNOSIS — I872 Venous insufficiency (chronic) (peripheral): Secondary | ICD-10-CM | POA: Diagnosis not present

## 2019-05-25 DIAGNOSIS — L603 Nail dystrophy: Secondary | ICD-10-CM | POA: Diagnosis not present

## 2019-05-25 DIAGNOSIS — E139 Other specified diabetes mellitus without complications: Secondary | ICD-10-CM | POA: Diagnosis not present

## 2019-05-25 DIAGNOSIS — R609 Edema, unspecified: Secondary | ICD-10-CM | POA: Diagnosis not present

## 2019-06-01 ENCOUNTER — Other Ambulatory Visit: Payer: Self-pay | Admitting: Internal Medicine

## 2019-06-01 NOTE — Telephone Encounter (Signed)
Patient is requesting a refill of pregabalin (LYRICA) 75 MG capsule  Last OV- 03/23/2019 Next OV- 06/03/2019

## 2019-06-02 DIAGNOSIS — I872 Venous insufficiency (chronic) (peripheral): Secondary | ICD-10-CM | POA: Diagnosis not present

## 2019-06-03 ENCOUNTER — Telehealth: Payer: Self-pay

## 2019-06-03 DIAGNOSIS — M79662 Pain in left lower leg: Secondary | ICD-10-CM | POA: Diagnosis not present

## 2019-06-03 DIAGNOSIS — M25662 Stiffness of left knee, not elsewhere classified: Secondary | ICD-10-CM | POA: Diagnosis not present

## 2019-06-09 ENCOUNTER — Other Ambulatory Visit: Payer: Self-pay | Admitting: Internal Medicine

## 2019-06-10 ENCOUNTER — Other Ambulatory Visit: Payer: Self-pay | Admitting: Family Medicine

## 2019-06-10 DIAGNOSIS — M898X6 Other specified disorders of bone, lower leg: Secondary | ICD-10-CM

## 2019-06-15 ENCOUNTER — Telehealth: Payer: Medicare Other

## 2019-06-15 ENCOUNTER — Ambulatory Visit: Payer: Self-pay

## 2019-06-15 ENCOUNTER — Other Ambulatory Visit: Payer: Self-pay

## 2019-06-15 DIAGNOSIS — E559 Vitamin D deficiency, unspecified: Secondary | ICD-10-CM

## 2019-06-15 DIAGNOSIS — M797 Fibromyalgia: Secondary | ICD-10-CM

## 2019-06-15 DIAGNOSIS — E1165 Type 2 diabetes mellitus with hyperglycemia: Secondary | ICD-10-CM

## 2019-06-15 DIAGNOSIS — I10 Essential (primary) hypertension: Secondary | ICD-10-CM

## 2019-06-15 DIAGNOSIS — I739 Peripheral vascular disease, unspecified: Secondary | ICD-10-CM

## 2019-06-16 ENCOUNTER — Encounter: Payer: Self-pay | Admitting: Cardiovascular Disease

## 2019-06-16 ENCOUNTER — Other Ambulatory Visit: Payer: Self-pay

## 2019-06-16 ENCOUNTER — Ambulatory Visit (INDEPENDENT_AMBULATORY_CARE_PROVIDER_SITE_OTHER): Payer: Medicare Other | Admitting: Cardiovascular Disease

## 2019-06-16 VITALS — BP 132/68 | HR 84 | Ht 61.0 in | Wt 175.5 lb

## 2019-06-16 DIAGNOSIS — I739 Peripheral vascular disease, unspecified: Secondary | ICD-10-CM

## 2019-06-16 DIAGNOSIS — M7989 Other specified soft tissue disorders: Secondary | ICD-10-CM

## 2019-06-16 DIAGNOSIS — I25119 Atherosclerotic heart disease of native coronary artery with unspecified angina pectoris: Secondary | ICD-10-CM | POA: Diagnosis not present

## 2019-06-16 DIAGNOSIS — I5032 Chronic diastolic (congestive) heart failure: Secondary | ICD-10-CM

## 2019-06-16 DIAGNOSIS — I10 Essential (primary) hypertension: Secondary | ICD-10-CM

## 2019-06-16 MED ORDER — FUROSEMIDE 40 MG PO TABS: 40.0000 mg | ORAL_TABLET | ORAL | 1 refills | Status: DC

## 2019-06-16 MED ORDER — POTASSIUM CHLORIDE CRYS ER 20 MEQ PO TBCR: 20.0000 meq | EXTENDED_RELEASE_TABLET | ORAL | 1 refills | Status: DC

## 2019-06-16 NOTE — Chronic Care Management (AMB) (Signed)
  Chronic Care Management   Outreach Note  06/16/2019 Name: Sheila Reynolds MRN: [id number] DOB: 1945/12/08  Referred by: Glendale Chard, MD Reason for referral : Chronic Care Management (FU RN Call - hiatal hernia)   An unsuccessful telephone outreach was attempted today. The patient was referred to the case management team for assistance with care management and care coordination.   Follow Up Plan: A HIPPA compliant phone message was left for the patient providing contact information and requesting a return call.  Telephone follow up appointment with care management team member scheduled for: 07/15/19  Barb Merino, RN, BSN, CCM Care Management Coordinator [place] [facility]  Direct Phone: [phone]

## 2019-06-16 NOTE — Progress Notes (Signed)
Cardiology Office Note   Date:  06/16/2019   ID:  Sheila Reynolds, Sheila Reynolds 03-Oct-1945, MRN [id number]  PCP:  Glendale Chard, MD  Cardiologist:   Mertie Moores, MD   Chief Complaint  Patient presents with  . Hypertension  . Coronary Artery Disease   1. Hyperlipidemia 2.  CAD -  orbital atherectomy and PCI using Xience Sierra 3.5 x 33 mm DES to prox / mid LAD  3. Nonsustained ventricular tachycardia 4. Mild to moderate coronary artery disease by cath in 2009. 5. 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 Keturah Barre 6.  Venous insufficiency     Sheila Reynolds is a 74 y.o. female with the above noted hx. She has continued to have some palpitations. She has occasional episodes of lightheadedness when she has palpitations. Her main issue has been lots of total body cramps. She has been seen in the emergency room. Her lab work looked fine.  She's had lots of dyspnea especially with exertion. She has a history of sarcoidosis and is followed by Dr. Annamaria Boots.  March 11, 2012: Sheila Reynolds has had more palpitations recently. It Appears that her metoprolol dose was decreased slightly. We placed an event monitor on her. She did not have any episodes of atrial fibrillation. She has occasional drinker atrial contractions and occasional premature ventricular contractions. There were no life-threatening arrhythmias.  She is doing well from a cardiac standpoint. She's not had any episodes of syncope. She has significant shortness of breath especially when climbing stairs. This is likely due to her sarcoidosis. She denies any chest pain. Her BP is a bit elevated. She avoids salt.   May 02, 2013 :  She presented to see Richardson Dopp in February with some increased palpitations and lightheadedness. He recommended that she decrease her caffeine intake. He placed a 21 day event monitor and instructed her to take an extra metoprolol as needed.  The palpitations can occur at any time. They occur with rest and with  exertion. They're not constant. The last 30-45 seconds and occur multiple times through the day.  Sept. 16, 2015:  Still having palpitations. She has had more leg edema and her medical doctor on Lasix which he now takes about every other day. She was not on potassium replacement. This may be the cause of her increased PVCs.   May 24, 2014:  Sheila Reynolds is a 74 y.o. female who presents for follow up of her palpitations   Still has these palpitations.  No syncope.   Jan. 10, 2017:  Doing ok Still has DOE  Has palpitations on occasion. Not getting much exercise .  expalined a typical exercise program .   Start slow, gradually increase   May 07, 2016;   Sheila Reynolds is seen today for follow up .  Has been having more palpitations Seem to be getting worse  Went to there ER last week -  the ER note does not describe any specific arrhythmias. No passing out but she did get lightheaded when she had palpitations on one occasion   Has rare episodes of atypical CP .  Lasts a second Not associated with exertion .  Walks 1.5 miles 3-4 times a week .   This walking does not cause any palpitation of CP   July 17, 2016 Sheila Reynolds is seen today for follow up  30 day monitor showed no significant arrhythmias  Having leg pain and swelling  during night and day  Not worsened by exercise  No CP or dyspnea -  still walking 1.5 miles 3-4 days a week   December 22, 2016:  Sheila Reynolds had a cath recently that showed a 70 % LAD stenosis . Has right arm pain but this is not exertional . Has pins and needles sensation Has DOE with stairs and carrying something heavy .  Has not really noticed any difference in how she feels on the new meds which include Imdur 30 mg , Plavix 75 ,  + cough, chronic ,  No hemoptysis. Does not exercise regularly .    January 29, 2017: Sheila Reynolds is seen today for a follow-up visit.  She recently had rotational atherectomy and stenting of her proximal LAD and mid  LAD. Still has some pins and needles sensation Has been walking in the mall.  Does not have CP . Has some DOE with that   .  Jul 09, 2017:   Sheila Reynolds is doing well.  She is status post rotational atherectomy and stent placement to her proximal/mid LAD in December, 2018. Had left eye surgery in left eye.    Occasional CP ,  Thinks it might be indigestion .  Not associated with eating or drinking . Pain was on the left side of left breast.   Lasts a few minutes Did not have much pain prior to her PCI to LAD .  Has been exercising .   Exercise does not cause any CP .  Relieved with SL NTG   BP has been elevated.   Aug. 26, 2019: Following her visit in February, she was having some chest pain.  A stress Myoview study was performed and was found to be low risk.  She has normal left ventricular systolic function and no ischemia. She is feeling quite well.  She has not had any recurrent episodes of chest pain. She is exercising some .   Has been having bilateral leg pain and swelling.  She had an arterial Doppler performed for peripheral vascular disease.  As of today the study has not yet been read.  January 29, 2018: Sheila Reynolds is seen today for 2 reasons.  She is been having more shortness of breath than normal.  She also needs preoperative evaluation.  She has a history of coronary artery disease and is status post stenting of her LAD in December 2018. Is having more DOE with any activity , climbing stairs,  Similar to how she felt prior to stenting Resolves after 5- 10 min of rest.  Has not taken a NTG   Also needs to have eyelid  surgery  Scheduled for Dec. 31.   Oct. 27. 2020  Sheila Reynolds is seen today . Hx of CAD - stenting of her LAD in Dec. 2018   Has some dyspnea.  Constant back pain , may last for hours.  Radiates through to her chest .  Has not tried any NTG. Not relieved with alka seltzer.   Has had some R eye vision issues Was found to have had 2 strokes over the  past years. 1 was an older CVA and 1 was a more recent CVA  Has seen a neurologist her in [place]. Carotid duplex scan revealed mild plaque bilaterally but no significant stenoses. Transcranial Doppler revealed some diffuse plaque but no critical stenoses.  Jun 16, 2019:  She has had some worsening shortness of breath as well as some leg edema recently.  The left leg swells worse than the right leg. Echo from Dec. 2020 - showed normal LV systolic function with grade I diastolic  dysfuncton  Venous duplex was normal  She has been seeing the podiatrist down in [place] and was advised to have a venogram for further evaluation.  Past Medical History:  Diagnosis Date  . Anemia    3 months ago anemic  . Anxiety    on meds  . Arthritis    "all over" (01/15/2017)  . Asthma   . Bronchitis with emphysema   . Chest pain   . Chronic bronchitis ([place])   . Coronary artery disease    a. 01/2017 she underwent orbital atherectomy/DES to the proxmal LAD and PTCA to ostial D2. 2D Echo 01/15/17 showed mild LVH, EF 60-65%, grade 1 DD.  . Family history of anesthesia complication    daughter N/V  . Fibromyalgia   . GERD (gastroesophageal reflux disease)    on meds  . Heart murmur   . History of hiatal hernia   . Hx of echocardiogram    Echo (03/2013):  Tech limited; Mild focal basal septal hypertrophy, EF 60-65%, normal RVF  . Hyperlipidemia   . Hypertension   . Nonsustained ventricular tachycardia ([place])   . OSA on CPAP   . Premature atrial contractions   . PVC (premature ventricular contraction)    a. Holter 12/16: NSR, occ PAC,PVCs  . Sarcoidosis   . Type II diabetes mellitus ([place])     Past Surgical History:  Procedure Laterality Date  . BREAST SURGERY    . CARDIAC CATHETERIZATION  05/04/2007   reveals overall normal left ventricular systolic function. Ejection fraction 65-70%  . CATARACT EXTRACTION W/PHACO Right 07/20/2013   Procedure: CATARACT EXTRACTION PHACO AND INTRAOCULAR LENS  PLACEMENT (IOC);  Surgeon: Marylynn Pearson, MD;  Location: [facility];  Service: Ophthalmology;  Laterality: Right;  . COLONOSCOPY W/ BIOPSIES AND POLYPECTOMY    . COLONOSCOPY WITH PROPOFOL N/A 09/07/2014   Procedure: COLONOSCOPY WITH PROPOFOL;  Surgeon: Juanita Craver, MD;  Location: [facility];  Service: Endoscopy;  Laterality: N/A;  . CORONARY ANGIOPLASTY WITH STENT PLACEMENT  01/15/2017  . CORONARY ATHERECTOMY N/A 01/15/2017   Procedure: CORONARY ATHERECTOMY;  Surgeon: Nelva Bush, MD;  Location: [facility];  Service: Cardiovascular;  Laterality: N/A;  . CORONARY BALLOON ANGIOPLASTY N/A 01/15/2017   Procedure: CORONARY BALLOON ANGIOPLASTY;  Surgeon: Nelva Bush, MD;  Location: [facility];  Service: Cardiovascular;  Laterality: N/A;  . CORONARY STENT INTERVENTION N/A 01/15/2017   Procedure: CORONARY STENT INTERVENTION;  Surgeon: Nelva Bush, MD;  Location: [facility];  Service: Cardiovascular;  Laterality: N/A;  . ESOPHAGOGASTRODUODENOSCOPY (EGD) WITH PROPOFOL N/A 09/07/2014   Procedure: ESOPHAGOGASTRODUODENOSCOPY (EGD) WITH PROPOFOL;  Surgeon: Juanita Craver, MD;  [facility];  Service: Endoscopy;  Laterality: N/A;  . EXTERNAL EAR SURGERY Bilateral 1970s   tumors removed  . EYE SURGERY Left 2019   cataract extraction   . EYE SURGERY Right 02/09/2018   eyelid surgery   . INTRAVASCULAR PRESSURE WIRE/FFR STUDY N/A 12/12/2016   Procedure: INTRAVASCULAR PRESSURE WIRE/FFR STUDY;  Surgeon: Nelva Bush, MD;  Location: [facility];  Service: Cardiovascular;  Laterality: N/A;  . MINI SHUNT INSERTION Right 07/20/2013   Procedure: INSERTION OF GLAUCOMA FILTRATION DEVICE RIGHT EYE;  Surgeon: Marylynn Pearson, MD;  Location: [facility];  Service: Ophthalmology;  Laterality: Right;  . MITOMYCIN C APPLICATION Right [id number]   Procedure: MITOMYCIN C APPLICATION;  Surgeon: Marylynn Pearson, MD;  Location: [facility];  Service: Ophthalmology;  Laterality: Right;  . MITOMYCIN C  APPLICATION Right [id number]   Procedure: MITOMYCIN C  APPLICATION RIGHT EYE;  Surgeon: Marylynn Pearson, MD;  Location: [facility];  Service: Ophthalmology;  Laterality: Right;  . PLACEMENT OF BREAST IMPLANTS Bilateral 1992   "took all my breast tissue out; put implants in;fibrocystic breast disease "  . RIGHT/LEFT HEART CATH AND CORONARY ANGIOGRAPHY N/A 12/12/2016   Procedure: RIGHT/LEFT HEART CATH AND CORONARY ANGIOGRAPHY;  Surgeon: Nelva Bush, MD;  Location: [facility];  Service: Cardiovascular;  Laterality: N/A;  . TONSILLECTOMY    . TOTAL ABDOMINAL HYSTERECTOMY  1982  . TRABECULECTOMY Right 02/21/2015   Procedure: TRABECULECTOMY WITH [facility] ON THE RIGHT EYE;  Surgeon: Marylynn Pearson, MD;  Location: [facility];  Service: Ophthalmology;  Laterality: Right;     Current Outpatient Medications  Medication Sig Dispense Refill  . acetaminophen (TYLENOL) 500 MG tablet Take 1,000 mg by mouth 2 (two) times daily as needed for moderate pain or headache.    . albuterol (PROVENTIL) (2.5 MG/3ML) 0.083% nebulizer solution Take 3 mLs (2.5 mg total) by nebulization daily as needed for wheezing or shortness of breath. 75 mL 12  . albuterol (VENTOLIN HFA) 108 (90 Base) MCG/ACT inhaler Inhale 2 puffs into the lungs every 6 (six) hours as needed for wheezing or shortness of breath. 1 Inhaler 12  . atorvastatin (LIPITOR) 80 MG tablet Take 1 tablet (80 mg total) by mouth daily. 90 tablet 3  . brimonidine (ALPHAGAN P) 0.1 % SOLN Place 1 drop into both eyes 2 (two) times daily.     . brinzolamide (AZOPT) 1 % ophthalmic suspension Place 1 drop into both eyes 3 (three) times daily.     . carvedilol (COREG) 12.5 MG tablet TAKE 1 TABLET(12.5 MG) BY MOUTH TWICE DAILY 180 tablet 3  . clopidogrel (PLAVIX) 75 MG tablet Take 1 tablet (75 mg total) by mouth daily. 90 tablet 3  . isosorbide mononitrate (IMDUR) 60 MG 24 hr tablet Take 1 tablet (60 mg total) by mouth daily. 90 tablet 3  . JANUMET 50-500 MG tablet TAKE 1 TABLET BY  MOUTH TWICE DAILY WITH A MEAL 180 tablet 1  . Lancets (ONETOUCH DELICA PLUS [id number]) MISC CHECK BLOOD SUGAR BEFORE BREAKFAST AND DINNER 100 each 1  . montelukast (SINGULAIR) 10 MG tablet TAKE 1 TABLET(10 MG) BY MOUTH EVERY EVENING 90 tablet 1  . Nebulizers (COMPRESSOR/NEBULIZER) MISC Use as directed 1 each 0  . nitroGLYCERIN (NITROSTAT) 0.4 MG SL tablet Place 1 tablet (0.4 mg total) under the tongue every 5 (five) minutes as needed for chest pain. 25 tablet prn  . olmesartan (BENICAR) 20 MG tablet Take 20 mg by mouth daily.    . pantoprazole (PROTONIX) 40 MG tablet TAKE 1 TABLET(40 MG) BY MOUTH DAILY 30 tablet 7  . prednisoLONE acetate (PRED FORTE) 1 % ophthalmic suspension Place 1 drop into both eyes 4 (four) times daily.     . pregabalin (LYRICA) 75 MG capsule TAKE 1 CAPSULE(75 MG) BY MOUTH TWICE DAILY 180 capsule 1  . promethazine (PHENERGAN) 25 MG tablet Take 1 tablet (25 mg total) by mouth every 6 (six) hours as needed for nausea or vomiting. 30 tablet 0  . traMADol (ULTRAM) 50 MG tablet Take 1 tablet (50 mg total) by mouth every 6 (six) hours as needed. 40 tablet 0  . travoprost, benzalkonium, (TRAVATAN) 0.004 % ophthalmic solution Place 1 drop into the right eye at bedtime.     . furosemide (LASIX) 40 MG tablet Take 1 tablet (40 mg total) by mouth every other day. 90 tablet 1  .  potassium chloride SA (KLOR-CON) 20 MEQ tablet Take 1 tablet (20 mEq total) by mouth every other day. 90 tablet 1   No current facility-administered medications for this visit.    Allergies:   Crestor [rosuvastatin calcium], Demerol  [meperidine hcl], Shellfish allergy, and Demerol [meperidine]    Social History:  The patient  reports that she quit smoking about 39 years ago. Her smoking use included cigarettes. She has a 4.00 pack-year smoking history. She has never used smokeless tobacco. She reports that she does not drink alcohol or use drugs.   Family History:  The patient's family history includes Cancer  in her mother and paternal grandmother; Diabetes in her brother and father; Glaucoma in her brother and father; Heart disease in her father and paternal grandmother.    ROS: Noted in current history, otherwise review of systems is negative.  Physical Exam: Blood pressure 132/68, pulse 84, height 5\' 1"  (1.549 m), weight 175 lb 8 oz (79.6 kg), SpO2 98 %.  GEN:  Well nourished, well developed in no acute distress HEENT: Normal NECK: No JVD; No carotid bruits LYMPHATICS: No lymphadenopathy CARDIAC: RRR , no murmurs, rubs, gallops RESPIRATORY:  Clear to auscultation without rales, wheezing or rhonchi  ABDOMEN: Soft, non-tender, non-distended MUSCULOSKELETAL:  No edema; No deformity  SKIN: Warm and dry NEUROLOGIC:  Alert and oriented x 3    EKG:    Recent Labs: 07/21/2018: BNP 51.1 09/20/2018: NT-Pro BNP 126 01/01/2019: Magnesium 1.7 01/23/2019: Hemoglobin 11.1; Platelets 197 03/23/2019: ALT 17; BUN 10; Creatinine, Ser 0.79; Potassium 4.3; Sodium 142    Lipid Panel    Component Value Date/Time   CHOL 133 03/23/2019 1141   TRIG 102 03/23/2019 1141   HDL 41 03/23/2019 1141   CHOLHDL 3.2 03/23/2019 1141   CHOLHDL 3 05/24/2014 1650   VLDL 20.8 05/24/2014 1650   LDLCALC 73 03/23/2019 1141      Wt Readings from Last 3 Encounters:  06/16/19 175 lb 8 oz (79.6 kg)  03/23/19 175 lb 9.6 oz (79.7 kg)  03/23/19 175 lb 9.6 oz (79.7 kg)      Other studies Reviewed: Additional studies/ records that were reviewed today include: . Review of the above records demonstrates:    ASSESSMENT AND PLAN:   1.   Coronary artery disease:     .no angina .  Has some atypical cp that does  not sound like angina  Will continue to follow   2. Hyperlipidemia -  Labs are stable   4. Nonsustained ventricular tachycardia-   No recurrent palpitations    5.  Sarcoidosis -  Stable,  Followed by pulmonary .  No evidence of pulmonary HTN on echo    6.  Leg edema : She has normal left ventricular  systolic function.  She has grade 1 diastolic dysfunction.  Her leg edema may be due to her diastolic dysfunction.  We will increase her Lasix to 40 mg grams every other day instead of every third day.  We will also increase potassium chloride to 20 mEq every other day. Check a basic metabolic profile in 3 weeks.  She has been referred to a vascular surgery office in [place] from a local podiatrist.  I think it would be beneficial to have her see our vascular surgeons.  We will arrange for a second opinion consultation with our vascular surgeons to assess her venous insufficiency.   Current medicines are reviewed at length with the patient today.  The patient does not have concerns regarding  medicines.  The following changes have been made:  no change  Labs/ tests ordered today include:   Orders Placed This Encounter  Procedures  . Basic Metabolic Panel (BMET)  . Ambulatory referral to Vascular Surgery     Disposition:   I'll see her in 6 months   Signed, Mertie Moores, MD  06/16/2019 10:52 AM    [facility] [address], [address], [place]  [address] Phone: [phone]; Fax: [phone]

## 2019-06-16 NOTE — Patient Instructions (Addendum)
Medication Instructions:  Your physician has recommended you make the following change in your medication:  INCREASE Lasix (Furosemide) to 40 mg every other day INCREASE Kdur (Potassium chloride) to 20 mEq every other day (same days that you take Lasix) *If you need a refill on your cardiac medications before your next appointment, please call your pharmacy*   Lab Work: Your physician recommends that you return for lab work in: 3 weeks on Thursday May 27 - you may come in anytime between 7:30 am and 4:30 pm and you do not have to FAST  If you have labs (blood work) drawn today and your tests are completely normal, you will receive your results only by: [name] MyChart Message (if you have MyChart) OR . A paper copy in the mail If you have any lab test that is abnormal or we need to change your treatment, we will call you to review the results.   Testing/Procedures: None Ordered   Follow-Up: At [facility], you and your health needs are our priority.  As part of our continuing mission to provide you with exceptional heart care, we have created designated Provider Care Teams.  These Care Teams include your primary Cardiologist (physician) and Advanced Practice Providers (APPs -  Physician Assistants and Nurse Practitioners) who all work together to provide you with the care you need, when you need it.  Your next appointment:   6 month(s)  The format for your next appointment:   In Person  Provider:   You may see Mertie Moores, MD or one of the following Advanced Practice Providers on your designated Care Team:    Richardson Dopp, PA-C  Robbie Lis, [place]    Other Instructions You have been referred to [facility]     For your  leg edema you  should do  the following 1. Leg elevation - I recommend the Lounge Dr. Leg rest.  See below for details  2. Salt restriction  -  Use potassium chloride instead of regular salt as a salt substitute. 3. Walk  regularly 4. Compression hose - [facility] [employer] 5. Weight loss    Available on [email or web address] Or  Go to [email or web address]

## 2019-06-28 ENCOUNTER — Other Ambulatory Visit: Payer: Self-pay | Admitting: Physician Assistant

## 2019-06-28 ENCOUNTER — Other Ambulatory Visit: Payer: Self-pay | Admitting: Pharmacy Technician

## 2019-06-28 NOTE — Patient Outreach (Signed)
[facility])  06/28/2019  Mireia Kosek Leffler [birth year] [id number]  Follow up call placed to [employer] regarding patient assistance application(s) for Proventil HFA and Janumet , [place] N[place] confirms patient has not returned attestation form that was mailed out to her.   Follow up:  Will remove myself from care team due to patient not returning required form to company.  Maud Deed Chana Bode, [place] Certified Pharmacy Technician [employer]

## 2019-07-01 DIAGNOSIS — R262 Difficulty in walking, not elsewhere classified: Secondary | ICD-10-CM | POA: Diagnosis not present

## 2019-07-01 DIAGNOSIS — R2681 Unsteadiness on feet: Secondary | ICD-10-CM | POA: Diagnosis not present

## 2019-07-01 DIAGNOSIS — M79605 Pain in left leg: Secondary | ICD-10-CM | POA: Diagnosis not present

## 2019-07-05 ENCOUNTER — Inpatient Hospital Stay: Admission: RE | Admit: 2019-07-05 | Payer: Medicare Other | Source: Ambulatory Visit

## 2019-07-05 ENCOUNTER — Ambulatory Visit
Admission: RE | Admit: 2019-07-05 | Discharge: 2019-07-05 | Disposition: A | Discharge status: Home / Self Care | Payer: Medicare Other | Source: Ambulatory Visit | Attending: Family Medicine | Admitting: Family Medicine

## 2019-07-05 ENCOUNTER — Other Ambulatory Visit: Payer: Self-pay

## 2019-07-05 DIAGNOSIS — R262 Difficulty in walking, not elsewhere classified: Secondary | ICD-10-CM | POA: Diagnosis not present

## 2019-07-05 DIAGNOSIS — M7989 Other specified soft tissue disorders: Secondary | ICD-10-CM | POA: Diagnosis not present

## 2019-07-05 DIAGNOSIS — M79605 Pain in left leg: Secondary | ICD-10-CM | POA: Diagnosis not present

## 2019-07-05 DIAGNOSIS — M898X6 Other specified disorders of bone, lower leg: Secondary | ICD-10-CM

## 2019-07-05 DIAGNOSIS — R2681 Unsteadiness on feet: Secondary | ICD-10-CM | POA: Diagnosis not present

## 2019-07-05 DIAGNOSIS — R600 Localized edema: Secondary | ICD-10-CM | POA: Diagnosis not present

## 2019-07-07 ENCOUNTER — Other Ambulatory Visit: Payer: Self-pay

## 2019-07-07 ENCOUNTER — Other Ambulatory Visit: Payer: Medicare Other

## 2019-07-07 DIAGNOSIS — I739 Peripheral vascular disease, unspecified: Secondary | ICD-10-CM | POA: Diagnosis not present

## 2019-07-07 DIAGNOSIS — I5032 Chronic diastolic (congestive) heart failure: Secondary | ICD-10-CM | POA: Diagnosis not present

## 2019-07-07 DIAGNOSIS — M7989 Other specified soft tissue disorders: Secondary | ICD-10-CM

## 2019-07-07 DIAGNOSIS — I10 Essential (primary) hypertension: Secondary | ICD-10-CM | POA: Diagnosis not present

## 2019-07-07 DIAGNOSIS — M25562 Pain in left knee: Secondary | ICD-10-CM | POA: Diagnosis not present

## 2019-07-08 LAB — BASIC METABOLIC PANEL
BUN/Creatinine Ratio: 19 (ref 12–28)
BUN: 16 mg/dL (ref 8–27)
CO2: 23 mmol/L (ref 20–29)
Calcium: 10 mg/dL (ref 8.7–10.3)
Chloride: 102 mmol/L (ref 96–106)
Creatinine, Ser: 0.86 mg/dL (ref 0.57–1.00)
GFR calc Af Amer: 78 mL/min/{1.73_m2} (ref >59)
GFR calc non Af Amer: 68 mL/min/{1.73_m2} (ref >59)
Glucose: 94 mg/dL (ref 65–99)
Potassium: 4.6 mmol/L (ref 3.5–5.2)
Sodium: 140 mmol/L (ref 134–144)

## 2019-07-12 DIAGNOSIS — R2681 Unsteadiness on feet: Secondary | ICD-10-CM | POA: Diagnosis not present

## 2019-07-12 DIAGNOSIS — M79605 Pain in left leg: Secondary | ICD-10-CM | POA: Diagnosis not present

## 2019-07-12 DIAGNOSIS — R262 Difficulty in walking, not elsewhere classified: Secondary | ICD-10-CM | POA: Diagnosis not present

## 2019-07-14 DIAGNOSIS — M79605 Pain in left leg: Secondary | ICD-10-CM | POA: Diagnosis not present

## 2019-07-14 DIAGNOSIS — R262 Difficulty in walking, not elsewhere classified: Secondary | ICD-10-CM | POA: Diagnosis not present

## 2019-07-14 DIAGNOSIS — R2681 Unsteadiness on feet: Secondary | ICD-10-CM | POA: Diagnosis not present

## 2019-07-15 ENCOUNTER — Ambulatory Visit: Payer: Self-pay

## 2019-07-15 ENCOUNTER — Other Ambulatory Visit: Payer: Self-pay

## 2019-07-15 ENCOUNTER — Telehealth: Payer: Medicare Other

## 2019-07-15 DIAGNOSIS — M797 Fibromyalgia: Secondary | ICD-10-CM

## 2019-07-15 DIAGNOSIS — I10 Essential (primary) hypertension: Secondary | ICD-10-CM

## 2019-07-15 DIAGNOSIS — I739 Peripheral vascular disease, unspecified: Secondary | ICD-10-CM

## 2019-07-15 DIAGNOSIS — E1165 Type 2 diabetes mellitus with hyperglycemia: Secondary | ICD-10-CM

## 2019-07-15 DIAGNOSIS — E559 Vitamin D deficiency, unspecified: Secondary | ICD-10-CM

## 2019-07-18 DIAGNOSIS — H401113 Primary open-angle glaucoma, right eye, severe stage: Secondary | ICD-10-CM | POA: Diagnosis not present

## 2019-07-18 DIAGNOSIS — H2013 Chronic iridocyclitis, bilateral: Secondary | ICD-10-CM | POA: Diagnosis not present

## 2019-07-18 DIAGNOSIS — H401122 Primary open-angle glaucoma, left eye, moderate stage: Secondary | ICD-10-CM | POA: Diagnosis not present

## 2019-07-19 DIAGNOSIS — H2013 Chronic iridocyclitis, bilateral: Secondary | ICD-10-CM | POA: Diagnosis not present

## 2019-07-19 DIAGNOSIS — H35371 Puckering of macula, right eye: Secondary | ICD-10-CM | POA: Diagnosis not present

## 2019-07-19 DIAGNOSIS — H3581 Retinal edema: Secondary | ICD-10-CM | POA: Diagnosis not present

## 2019-07-19 LAB — HM DIABETES EYE EXAM

## 2019-07-19 NOTE — Chronic Care Management (AMB) (Signed)
  Chronic Care Management   Outreach Note  07/19/2019 Name: Sheila Reynolds MRN: [id number] DOB: [birth year]  Referred by: Glendale Chard, MD Reason for referral : Chronic Care Management ([facility] RN CM Call )   A second unsuccessful telephone outreach was attempted today. The patient was referred to the case management team for assistance with care management and care coordination.   Follow Up Plan: Telephone follow up appointment with care management team member scheduled for: 08/16/19  Barb Merino, RN, BSN, CCM Care Management Coordinator [facility]  Direct Phone: [phone]

## 2019-07-21 DIAGNOSIS — R2681 Unsteadiness on feet: Secondary | ICD-10-CM | POA: Diagnosis not present

## 2019-07-21 DIAGNOSIS — M79605 Pain in left leg: Secondary | ICD-10-CM | POA: Diagnosis not present

## 2019-07-21 DIAGNOSIS — R262 Difficulty in walking, not elsewhere classified: Secondary | ICD-10-CM | POA: Diagnosis not present

## 2019-07-25 ENCOUNTER — Other Ambulatory Visit: Payer: Self-pay

## 2019-07-25 ENCOUNTER — Encounter: Payer: Self-pay | Admitting: Internal Medicine

## 2019-07-25 ENCOUNTER — Ambulatory Visit (INDEPENDENT_AMBULATORY_CARE_PROVIDER_SITE_OTHER): Payer: Medicare Other | Admitting: Internal Medicine

## 2019-07-25 VITALS — BP 116/78 | HR 67 | Temp 97.4°F | Ht 61.0 in | Wt 174.0 lb

## 2019-07-25 DIAGNOSIS — I25119 Atherosclerotic heart disease of native coronary artery with unspecified angina pectoris: Secondary | ICD-10-CM | POA: Diagnosis not present

## 2019-07-25 DIAGNOSIS — Z6832 Body mass index (BMI) 32.0-32.9, adult: Secondary | ICD-10-CM | POA: Diagnosis not present

## 2019-07-25 DIAGNOSIS — M79605 Pain in left leg: Secondary | ICD-10-CM | POA: Diagnosis not present

## 2019-07-25 DIAGNOSIS — K219 Gastro-esophageal reflux disease without esophagitis: Secondary | ICD-10-CM | POA: Diagnosis not present

## 2019-07-25 DIAGNOSIS — R300 Dysuria: Secondary | ICD-10-CM | POA: Diagnosis not present

## 2019-07-25 DIAGNOSIS — E1165 Type 2 diabetes mellitus with hyperglycemia: Secondary | ICD-10-CM

## 2019-07-25 DIAGNOSIS — K449 Diaphragmatic hernia without obstruction or gangrene: Secondary | ICD-10-CM | POA: Diagnosis not present

## 2019-07-25 DIAGNOSIS — R2681 Unsteadiness on feet: Secondary | ICD-10-CM | POA: Diagnosis not present

## 2019-07-25 DIAGNOSIS — E6609 Other obesity due to excess calories: Secondary | ICD-10-CM | POA: Insufficient documentation

## 2019-07-25 DIAGNOSIS — R233 Spontaneous ecchymoses: Secondary | ICD-10-CM | POA: Diagnosis not present

## 2019-07-25 DIAGNOSIS — I119 Hypertensive heart disease without heart failure: Secondary | ICD-10-CM | POA: Diagnosis not present

## 2019-07-25 DIAGNOSIS — R262 Difficulty in walking, not elsewhere classified: Secondary | ICD-10-CM | POA: Diagnosis not present

## 2019-07-25 DIAGNOSIS — E66811 Other obesity due to excess calories: Secondary | ICD-10-CM

## 2019-07-25 LAB — POCT URINALYSIS DIPSTICK
Bilirubin, UA: NEGATIVE
Blood, UA: NEGATIVE
Glucose, UA: NEGATIVE
Ketones, UA: NEGATIVE
Nitrite, UA: NEGATIVE
Protein, UA: NEGATIVE
Spec Grav, UA: 1.015 (ref 1.010–1.025)
Urobilinogen, UA: 0.2 E.U./dL
pH, UA: 5.5 (ref 5.0–8.0)

## 2019-07-25 MED ORDER — NITROGLYCERIN 0.4 MG SL SUBL: 0.4000 mg | SUBLINGUAL_TABLET | SUBLINGUAL | 99 refills | Status: AC | PRN

## 2019-07-25 NOTE — Patient Instructions (Addendum)
Please take Dexilant samples once daily INSTEAD of Pantoprazole for the next month  Food Choices for Gastroesophageal Reflux Disease, Adult When you have gastroesophageal reflux disease (GERD), the foods you eat and your eating habits are very important. Choosing the right foods can help ease your discomfort. Think about working with a nutrition specialist (dietitian) to help you make good choices. What are tips for following this plan?  Meals  Choose healthy foods that are low in fat, such as fruits, vegetables, whole grains, low-fat dairy products, and lean meat, fish, and poultry.  Eat small meals often instead of 3 large meals a day. Eat your meals slowly, and in a place where you are relaxed. Avoid bending over or lying down until 2-3 hours after eating.  Avoid eating meals 2-3 hours before bed.  Avoid drinking a lot of liquid with meals.  Cook foods using methods other than frying. Bake, grill, or broil food instead.  Avoid or limit: ? Chocolate. ? Peppermint or spearmint. ? Alcohol. ? Pepper. ? Black and decaffeinated coffee. ? Black and decaffeinated tea. ? Bubbly (carbonated) soft drinks. ? Caffeinated energy drinks and soft drinks.  Limit high-fat foods such as: ? Fatty meat or fried foods. ? Whole milk, cream, butter, or ice cream. ? Nuts and nut butters. ? Pastries, donuts, and sweets made with butter or shortening.  Avoid foods that cause symptoms. These foods may be different for everyone. Common foods that cause symptoms include: ? Tomatoes. ? Oranges, lemons, and limes. ? Peppers. ? Spicy food. ? Onions and garlic. ? Vinegar. Lifestyle  Maintain a healthy weight. Ask your doctor what weight is healthy for you. If you need to lose weight, work with your doctor to do so safely.  Exercise for at least 30 minutes for 5 or more days each week, or as told by your doctor.  Wear loose-fitting clothes.  Do not smoke. If you need help quitting, ask your  doctor.  Sleep with the head of your bed higher than your feet. Use a wedge under the mattress or blocks under the bed frame to raise the head of the bed. Summary  When you have gastroesophageal reflux disease (GERD), food and lifestyle choices are very important in easing your symptoms.  Eat small meals often instead of 3 large meals a day. Eat your meals slowly, and in a place where you are relaxed.  Limit high-fat foods such as fatty meat or fried foods.  Avoid bending over or lying down until 2-3 hours after eating.  Avoid peppermint and spearmint, caffeine, alcohol, and chocolate. This information is not intended to replace advice given to you by your health care provider. Make sure you discuss any questions you have with your health care provider. Document Revised: 05/20/2018 Document Reviewed: 03/04/2016 Elsevier Patient Education  [place].

## 2019-07-25 NOTE — Progress Notes (Signed)
This visit occurred during the SARS-CoV-2 public health emergency.  Safety protocols were in place, including screening questions prior to the visit, additional usage of staff PPE, and extensive cleaning of exam room while observing appropriate contact time as indicated for disinfecting solutions.  Subjective:     Patient ID: Sheila Reynolds , female    DOB: [birth year] , 74 y.o.   MRN: [id number]   Chief Complaint  Patient presents with   Diabetes   Hypertension    HPI  She presents today for DM/HTN check. She reports compliance with meds. States sugars are "ok".   Diabetes She presents for her follow-up diabetic visit. She has type 2 diabetes mellitus. Her disease course has been stable. There are no hypoglycemic associated symptoms. Pertinent negatives for diabetes include no blurred vision and no chest pain. There are no hypoglycemic complications. Diabetic complications include heart disease. Her weight is stable. Her breakfast blood glucose is taken between 7-8 am. Her breakfast blood glucose range is generally 90-110 mg/dl. An ACE inhibitor/angiotensin II receptor blocker is being taken. Eye exam is current.  Hypertension This is a chronic problem. The current episode started more than 1 year ago. The problem has been gradually improving since onset. The problem is controlled. Pertinent negatives include no blurred vision, chest pain or palpitations. Risk factors for coronary artery disease include diabetes mellitus, dyslipidemia, obesity, post-menopausal state and sedentary lifestyle.     Past Medical History:  Diagnosis Date   Anemia    3 months ago anemic   Anxiety    on meds   Arthritis    "all over" (01/15/2017)   Asthma    Bronchitis with emphysema    Chest pain    Chronic bronchitis ([place])    Coronary artery disease    a. 01/2017 she underwent orbital atherectomy/DES to the proxmal LAD and PTCA to ostial D2. 2D Echo 01/15/17 showed mild LVH, EF 60-65%, grade  1 DD.   Family history of anesthesia complication    daughter N/V   Fibromyalgia    GERD (gastroesophageal reflux disease)    on meds   Heart murmur    History of hiatal hernia    Hx of echocardiogram    Echo (03/2013):  Tech limited; Mild focal basal septal hypertrophy, EF 60-65%, normal RVF   Hyperlipidemia    Hypertension    Nonsustained ventricular tachycardia (HCC)    OSA on CPAP    Premature atrial contractions    PVC (premature ventricular contraction)    a. Holter 12/16: NSR, occ PAC,PVCs   Sarcoidosis    Type II diabetes mellitus ([place])      Family History  Problem Relation Age of Onset   Heart disease Father    Diabetes Father    Glaucoma Father    Cancer Mother        unknown type, ?lung   Heart disease Paternal Grandmother    Cancer Paternal Grandmother        unknown type   Diabetes Brother    Glaucoma Brother      Current Outpatient Medications:    acetaminophen (TYLENOL) 500 MG tablet, Take 1,000 mg by mouth 2 (two) times daily as needed for moderate pain or headache., Disp: , Rfl:    albuterol (PROVENTIL) (2.5 MG/3ML) 0.083% nebulizer solution, Take 3 mLs (2.5 mg total) by nebulization daily as needed for wheezing or shortness of breath., Disp: 75 mL, Rfl: 12   albuterol (VENTOLIN HFA) 108 (90 Base) MCG/ACT inhaler, Inhale 2  puffs into the lungs every 6 (six) hours as needed for wheezing or shortness of breath., Disp: 1 Inhaler, Rfl: 12   atorvastatin (LIPITOR) 80 MG tablet, Take 1 tablet (80 mg total) by mouth daily., Disp: 90 tablet, Rfl: 3   brimonidine (ALPHAGAN P) 0.1 % SOLN, Place 1 drop into both eyes 2 (two) times daily. , Disp: , Rfl:    brinzolamide (AZOPT) 1 % ophthalmic suspension, Place 1 drop into both eyes 3 (three) times daily. , Disp: , Rfl:    carvedilol (COREG) 12.5 MG tablet, TAKE 1 TABLET(12.5 MG) BY MOUTH TWICE DAILY, Disp: 180 tablet, Rfl: 3   clopidogrel (PLAVIX) 75 MG tablet, Take 1 tablet (75 mg total)  by mouth daily., Disp: 90 tablet, Rfl: 3   furosemide (LASIX) 40 MG tablet, Take 1 tablet (40 mg total) by mouth every other day. (Patient taking differently: Take 40 mg by mouth every other day. Every 2 days), Disp: 90 tablet, Rfl: 1   isosorbide mononitrate (IMDUR) 60 MG 24 hr tablet, Take 1 tablet (60 mg total) by mouth daily., Disp: 90 tablet, Rfl: 3   JANUMET 50-500 MG tablet, TAKE 1 TABLET BY MOUTH TWICE DAILY WITH A MEAL, Disp: 180 tablet, Rfl: 1   Lancets (ONETOUCH DELICA PLUS [id number]) MISC, CHECK BLOOD SUGAR BEFORE BREAKFAST AND DINNER, Disp: 100 each, Rfl: 1   montelukast (SINGULAIR) 10 MG tablet, TAKE 1 TABLET(10 MG) BY MOUTH EVERY EVENING, Disp: 90 tablet, Rfl: 1   Nebulizers (COMPRESSOR/NEBULIZER) MISC, Use as directed, Disp: 1 each, Rfl: 0   nitroGLYCERIN (NITROSTAT) 0.4 MG SL tablet, Place 1 tablet (0.4 mg total) under the tongue every 5 (five) minutes as needed for chest pain., Disp: 25 tablet, Rfl: prn   olmesartan (BENICAR) 20 MG tablet, Take 20 mg by mouth daily., Disp: , Rfl:    pantoprazole (PROTONIX) 40 MG tablet, TAKE 1 TABLET(40 MG) BY MOUTH DAILY, Disp: 30 tablet, Rfl: 11   potassium chloride SA (KLOR-CON) 20 MEQ tablet, Take 1 tablet (20 mEq total) by mouth every other day. (Patient taking differently: Take 20 mEq by mouth every other day. Only when taking lasix), Disp: 90 tablet, Rfl: 1   prednisoLONE acetate (PRED FORTE) 1 % ophthalmic suspension, Place 1 drop into both eyes 4 (four) times daily. , Disp: , Rfl:    pregabalin (LYRICA) 75 MG capsule, TAKE 1 CAPSULE(75 MG) BY MOUTH TWICE DAILY, Disp: 180 capsule, Rfl: 1   travoprost, benzalkonium, (TRAVATAN) 0.004 % ophthalmic solution, Place 1 drop into the right eye at bedtime. , Disp: , Rfl:    Allergies  Allergen Reactions   Crestor [Rosuvastatin Calcium] Other (See Comments)    muscle aches   Demerol  [Meperidine Hcl]     Other reaction(s): Hallucinations   Shellfish Allergy Itching and Other (See  Comments)    Crab, shrimp and lobster ---lips itch and tingle Tingling  Was told not to eat again after having a allergy test. Lobster, crab and shrimp Crab, shrimp and lobster ---lips itch and tingle PATIENT STATES SHE HAD A POSITVE ALLERGY TEST    Demerol [Meperidine] Other (See Comments)    Hallucinations      Review of Systems  Constitutional: Negative.   Eyes: Negative for blurred vision.  Respiratory: Negative.   Cardiovascular: Negative.  Negative for chest pain and palpitations.  Gastrointestinal: Positive for abdominal distention.       She is concerned her hernia has gotten worse. She c/o increased bloating. Her sx started to worsen over  six months ago. Has h/o GERd. Reports regular BMs. She c/o increased belching as well.   Skin:       She has noticed increased bruising. She is on Plavix. She reports she awakened with bruising on left wrist. She denies trauma. She reports compliance with meds. Denies taking fish oil.   Neurological: Negative.   Psychiatric/Behavioral: Negative.      Today's Vitals   07/25/19 1022  BP: 116/78  Pulse: 67  Temp: (!) 97.4 F (36.3 C)  TempSrc: Oral  Weight: 174 lb (78.9 kg)  Height: 5\' 1"  (1.549 m)  PainSc: 0-No pain   Body mass index is 32.88 kg/m.   Wt Readings from Last 3 Encounters:  07/25/19 174 lb (78.9 kg)  06/16/19 175 lb 8 oz (79.6 kg)  03/23/19 175 lb 9.6 oz (79.7 kg)     Objective:  Physical Exam Vitals and nursing note reviewed.  Constitutional:      Appearance: Normal appearance.  HENT:     Head: Normocephalic and atraumatic.  Cardiovascular:     Rate and Rhythm: Normal rate and regular rhythm.     Heart sounds: Normal heart sounds.  Pulmonary:     Effort: Pulmonary effort is normal.     Breath sounds: Normal breath sounds.  Abdominal:     General: Bowel sounds are normal. There is distension.     Palpations: Abdomen is soft.     Tenderness: There is abdominal tenderness.  Musculoskeletal:     Right  lower leg: 2+ Pitting Edema present.     Left lower leg: 2+ Pitting Edema present.  Skin:    General: Skin is warm.     Findings: Bruising present.     Comments: Bruising noted on right wrist.   Neurological:     General: No focal deficit present.     Mental Status: She is alert.  Psychiatric:        Mood and Affect: Mood normal.        Behavior: Behavior normal.         Assessment And Plan:     1. Uncontrolled type 2 diabetes mellitus with hyperglycemia (HCC)  Chronic, recent labs reviewed. Renal function stable in May; therefore, I will not check today. I will check an a1c and adjust meds as needed. She will rto in four months for re-evaluation.   - Hemoglobin A1c  2. Hypertensive heart disease without heart failure  Chronic, well controlled. She will continue with current meds. Most recent Cardiology note reviewed in full detail.   3. Hiatal hernia  Chronic. Likely exacerbating her reflux sx.   4. Gastroesophageal reflux disease without esophagitis  Chronic. She was given samples of Dexilant to take for four weeks IN PLACE of pantoprazole. Also advised to stop eating THREE HOURS prior to going to bed. She does admit to late night snacking, which is likely exacerbating her sx.   5. Spontaneous ecchymoses  She has two bracelets on her wrist, which she admits to sometimes wearing to sleep. Pt advised that this may have contributed to her bruising. Encouraged to take off all jewelry at bedtime. I will check platelet count today as well.   - CBC no Diff  6. Dysuria  I will check u/a to r/o UTI.   - POCT Urinalysis Dipstick (81002)  7. Class 1 obesity due to excess calories with serious comorbidity and body mass index (BMI) of 32.0 to 32.9 in adult  She is encouraged to increase her daily activity  to help her reach BMI 29 or less. She is advised to work up to 30 minutes five days per week.    Maximino Greenland, MD    THE PATIENT IS ENCOURAGED TO PRACTICE SOCIAL  DISTANCING DUE TO THE COVID-19 PANDEMIC.

## 2019-07-27 ENCOUNTER — Encounter: Payer: Self-pay | Admitting: Internal Medicine

## 2019-07-27 ENCOUNTER — Other Ambulatory Visit: Payer: Self-pay | Admitting: *Deleted

## 2019-07-27 DIAGNOSIS — R600 Localized edema: Secondary | ICD-10-CM

## 2019-07-29 ENCOUNTER — Other Ambulatory Visit: Payer: Self-pay

## 2019-07-29 ENCOUNTER — Ambulatory Visit (HOSPITAL_COMMUNITY)
Admission: RE | Admit: 2019-07-29 | Discharge: 2019-07-29 | Disposition: A | Discharge status: Home / Self Care | Payer: Medicare Other | Source: Ambulatory Visit | Attending: Vascular Surgery | Admitting: Vascular Surgery

## 2019-07-29 DIAGNOSIS — R600 Localized edema: Secondary | ICD-10-CM | POA: Insufficient documentation

## 2019-08-02 ENCOUNTER — Telehealth: Payer: Self-pay

## 2019-08-02 NOTE — Telephone Encounter (Signed)
The pt was called because she was unable to have her labs done at her last visit because she was a hard stick and seeing if she could come back and the pt said that she will be back today.

## 2019-08-03 LAB — CBC
Hematocrit: 35 % (ref 34.0–46.6)
Hemoglobin: 11.4 g/dL (ref 11.1–15.9)
MCH: 27.5 pg (ref 26.6–33.0)
MCHC: 32.6 g/dL (ref 31.5–35.7)
MCV: 84 fL (ref 79–97)
Platelets: 223 10*3/uL (ref 150–450)
RBC: 4.15 x10E6/uL (ref 3.77–5.28)
RDW: 14.3 % (ref 11.7–15.4)
WBC: 7.9 10*3/uL (ref 3.4–10.8)

## 2019-08-03 LAB — HEMOGLOBIN A1C
Est. average glucose Bld gHb Est-mCnc: 131 mg/dL
Hgb A1c MFr Bld: 6.2 % — ABNORMAL HIGH (ref 4.8–5.6)

## 2019-08-04 ENCOUNTER — Encounter: Payer: Self-pay | Admitting: Internal Medicine

## 2019-08-05 DIAGNOSIS — M79605 Pain in left leg: Secondary | ICD-10-CM | POA: Diagnosis not present

## 2019-08-05 DIAGNOSIS — R2681 Unsteadiness on feet: Secondary | ICD-10-CM | POA: Diagnosis not present

## 2019-08-05 DIAGNOSIS — R262 Difficulty in walking, not elsewhere classified: Secondary | ICD-10-CM | POA: Diagnosis not present

## 2019-08-09 ENCOUNTER — Other Ambulatory Visit: Payer: Self-pay

## 2019-08-09 ENCOUNTER — Encounter: Payer: Self-pay | Admitting: Vascular Surgery

## 2019-08-09 ENCOUNTER — Telehealth: Payer: Self-pay

## 2019-08-09 ENCOUNTER — Ambulatory Visit (INDEPENDENT_AMBULATORY_CARE_PROVIDER_SITE_OTHER): Payer: Medicare Other | Admitting: Vascular Surgery

## 2019-08-09 VITALS — BP 152/92 | HR 74 | Temp 97.6°F | Resp 20 | Ht 61.0 in | Wt 173.0 lb

## 2019-08-09 DIAGNOSIS — M79605 Pain in left leg: Secondary | ICD-10-CM | POA: Diagnosis not present

## 2019-08-09 DIAGNOSIS — I25119 Atherosclerotic heart disease of native coronary artery with unspecified angina pectoris: Secondary | ICD-10-CM | POA: Diagnosis not present

## 2019-08-09 DIAGNOSIS — R2681 Unsteadiness on feet: Secondary | ICD-10-CM | POA: Diagnosis not present

## 2019-08-09 DIAGNOSIS — R600 Localized edema: Secondary | ICD-10-CM

## 2019-08-09 DIAGNOSIS — R262 Difficulty in walking, not elsewhere classified: Secondary | ICD-10-CM | POA: Diagnosis not present

## 2019-08-09 NOTE — Progress Notes (Addendum)
[facility]  Patient name: Sheila Reynolds MRN: [id number] DOB: December 19, 1945 Sex: female  REASON FOR CONSULT: Evaluation bilateral lower extremity swelling left greater than right  HPI: Sheila Reynolds is a 74 y.o. female, who is here today for evaluation of bilateral lower extremity swelling.  She is here today with her husband.  She has multiple medical problems as outlined below.  She reports that the swelling is been present for at least 5 years and has been progressive.  The swelling is somewhat better in the morning and progresses throughout the day.  She is elevating her legs at night and is wearing compression garments.  She has no history of DVT.  No history of varicosities.  The swelling does extend down onto the dorsum of her foot and onto her toes.  Past Medical History:  Diagnosis Date  . Anemia    3 months ago anemic  . Anxiety    on meds  . Arthritis    "all over" (01/15/2017)  . Asthma   . Bronchitis with emphysema   . Chest pain   . Chronic bronchitis ([place])   . Coronary artery disease    a. 01/2017 she underwent orbital atherectomy/DES to the proxmal LAD and PTCA to ostial D2. 2D Echo 01/15/17 showed mild LVH, EF 60-65%, grade 1 DD.  . Family history of anesthesia complication    daughter N/V  . Fibromyalgia   . GERD (gastroesophageal reflux disease)    on meds  . Heart murmur   . History of hiatal hernia   . Hx of echocardiogram    Echo (03/2013):  Tech limited; Mild focal basal septal hypertrophy, EF 60-65%, normal RVF  . Hyperlipidemia   . Hypertension   . Nonsustained ventricular tachycardia ([place])   . OSA on CPAP   . Premature atrial contractions   . PVC (premature ventricular contraction)    a. Holter 12/16: NSR, occ PAC,PVCs  . Sarcoidosis   . Type II diabetes mellitus (HCC)     Family History  Problem Relation Age of Onset  . Heart disease Father   . Diabetes Father   . Glaucoma  Father   . Cancer Mother        unknown type, ?lung  . Heart disease Paternal Grandmother   . Cancer Paternal Grandmother        unknown type  . Diabetes Brother   . Glaucoma Brother     SOCIAL HISTORY: Social History   Socioeconomic History  . Marital status: Married    Spouse name: Not on file  . Number of children: 2  . Years of education: Not on file  . Highest education level: Not on file  Occupational History  . Occupation: unemployed    Fish farm manager: [employer]  Tobacco Use  . Smoking status: Former Smoker    Packs/day: 1.00    Years: 4.00    Pack years: 4.00    Types: Cigarettes    Quit date: 02/11/1980    Years since quitting: 39.5  . Smokeless tobacco: Never Used  Vaping Use  . Vaping Use: Never used  Substance and Sexual Activity  . Alcohol use: No  . Drug use: No  . Sexual activity: Yes  Other Topics Concern  . Not on file  Social History Narrative  . Not on file   Social Determinants of Health   Financial Resource Strain: Low Risk   . Difficulty of Paying Living Expenses: Not hard  at all  Food Insecurity: No Food Insecurity  . Worried About Charity fundraiser in the Last Year: Never true  . Ran Out of Food in the Last Year: Never true  Transportation Needs: No Transportation Needs  . Lack of Transportation (Medical): No  . Lack of Transportation (Non-Medical): No  Physical Activity: Sufficiently Active  . Days of Exercise per Week: 3 days  . Minutes of Exercise per Session: 60 min  Stress: Stress Concern Present  . Feeling of Stress : To some extent  Social Connections:   . Frequency of Communication with Friends and Family:   . Frequency of Social Gatherings with Friends and Family:   . Attends Religious Services:   . Active Member of Clubs or Organizations:   . Attends Archivist Meetings:   [name] Marital Status:   Intimate Partner Violence:   . Fear of Current or Ex-Partner:   . Emotionally Abused:   [name] Physically  Abused:   . Sexually Abused:     Allergies  Allergen Reactions  . Crestor [Rosuvastatin Calcium] Other (See Comments)    muscle aches  . Demerol  [Meperidine Hcl]     Other reaction(s): Hallucinations  . Shellfish Allergy Itching and Other (See Comments)    Crab, shrimp and lobster ---lips itch and tingle Tingling  Was told not to eat again after having a allergy test. Lobster, crab and shrimp Crab, shrimp and lobster ---lips itch and tingle PATIENT STATES SHE HAD A POSITVE ALLERGY TEST   . Demerol [Meperidine] Other (See Comments)    Hallucinations     Current Outpatient Medications  Medication Sig Dispense Refill  . acetaminophen (TYLENOL) 500 MG tablet Take 1,000 mg by mouth 2 (two) times daily as needed for moderate pain or headache.    . albuterol (PROVENTIL) (2.5 MG/3ML) 0.083% nebulizer solution Take 3 mLs (2.5 mg total) by nebulization daily as needed for wheezing or shortness of breath. 75 mL 12  . albuterol (VENTOLIN HFA) 108 (90 Base) MCG/ACT inhaler Inhale 2 puffs into the lungs every 6 (six) hours as needed for wheezing or shortness of breath. 1 Inhaler 12  . atorvastatin (LIPITOR) 80 MG tablet Take 1 tablet (80 mg total) by mouth daily. 90 tablet 3  . brimonidine (ALPHAGAN P) 0.1 % SOLN Place 1 drop into both eyes 2 (two) times daily.     . brinzolamide (AZOPT) 1 % ophthalmic suspension Place 1 drop into both eyes 3 (three) times daily.     . carvedilol (COREG) 12.5 MG tablet TAKE 1 TABLET(12.5 MG) BY MOUTH TWICE DAILY 180 tablet 3  . clopidogrel (PLAVIX) 75 MG tablet Take 1 tablet (75 mg total) by mouth daily. 90 tablet 3  . furosemide (LASIX) 40 MG tablet Take 1 tablet (40 mg total) by mouth every other day. (Patient taking differently: Take 40 mg by mouth every other day. Every 2 days) 90 tablet 1  . isosorbide mononitrate (IMDUR) 60 MG 24 hr tablet Take 1 tablet (60 mg total) by mouth daily. 90 tablet 3  . JANUMET 50-500 MG tablet TAKE 1 TABLET BY MOUTH TWICE DAILY  WITH A MEAL 180 tablet 1  . Lancets (ONETOUCH DELICA PLUS [id number]) MISC CHECK BLOOD SUGAR BEFORE BREAKFAST AND DINNER 100 each 1  . montelukast (SINGULAIR) 10 MG tablet TAKE 1 TABLET(10 MG) BY MOUTH EVERY EVENING 90 tablet 1  . Nebulizers (COMPRESSOR/NEBULIZER) MISC Use as directed 1 each 0  . nitroGLYCERIN (NITROSTAT) 0.4 MG SL tablet Place 1  tablet (0.4 mg total) under the tongue every 5 (five) minutes as needed for chest pain. 25 tablet prn  . olmesartan (BENICAR) 20 MG tablet Take 20 mg by mouth daily.    . pantoprazole (PROTONIX) 40 MG tablet TAKE 1 TABLET(40 MG) BY MOUTH DAILY 30 tablet 11  . potassium chloride SA (KLOR-CON) 20 MEQ tablet Take 1 tablet (20 mEq total) by mouth every other day. (Patient taking differently: Take 20 mEq by mouth every other day. Only when taking lasix) 90 tablet 1  . prednisoLONE acetate (PRED FORTE) 1 % ophthalmic suspension Place 1 drop into both eyes 4 (four) times daily.     . pregabalin (LYRICA) 75 MG capsule TAKE 1 CAPSULE(75 MG) BY MOUTH TWICE DAILY 180 capsule 1  . travoprost, benzalkonium, (TRAVATAN) 0.004 % ophthalmic solution Place 1 drop into the right eye at bedtime.     . meloxicam (MOBIC) 15 MG tablet Take 15 mg by mouth daily.     No current facility-administered medications for this visit.    REVIEW OF SYSTEMS:  [X]  denotes positive finding, [ ]  denotes negative finding Cardiac  Comments:  Chest pain or chest pressure:    Shortness of breath upon exertion: x   Short of breath when lying flat: x   Irregular heart rhythm: x       Vascular    Pain in calf, thigh, or hip brought on by ambulation:    Pain in feet at night that wakes you up from your sleep:  x   Blood clot in your veins:    Leg swelling:  x       Pulmonary    Oxygen at home:    Productive cough:     Wheezing:  x       Neurologic    Sudden weakness in arms or legs:     Sudden numbness in arms or legs:     Sudden onset of difficulty speaking or slurred speech:      Temporary loss of vision in one eye:     Problems with dizziness:         Gastrointestinal    Blood in stool:     Vomited blood:         Genitourinary    Burning when urinating:     Blood in urine:        Psychiatric    Major depression:         Hematologic    Bleeding problems:    Problems with blood clotting too easily:        Skin    Rashes or ulcers:        Constitutional    Fever or chills:      PHYSICAL EXAM: Vitals:   08/09/19 1000  BP: (!) 152/92  Pulse: 74  Resp: 20  Temp: 97.6 F (36.4 C)  SpO2: 97%  Weight: 173 lb (78.5 kg)  Height: 5\' 1"  (1.549 m)    GENERAL: The patient is a well-nourished female, in no acute distress. The vital signs are documented above. CARDIOVASCULAR: Carotid arteries without bruits bilaterally.  2+ radial and 2+ dorsalis pedis pulses bilaterally.  Does have swelling on her calves and ankles extending onto the dorsum of her feet bilaterally PULMONARY: There is good air exchange  ABDOMEN: Soft and non-tender  MUSCULOSKELETAL: There are no major deformities or cyanosis. NEUROLOGIC: No focal weakness or paresthesias are detected. SKIN: There are no ulcers or rashes noted. PSYCHIATRIC: The patient has a  normal affect.  DATA:  Venous noninvasive studies on 07/29/2019 the right leg reveals no evidence of reflux with no enlargement of her saphenous vein  MEDICAL ISSUES: I had a long discussion with the patient and her husband.  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  Explained the difficulty in treatment and the treatment is directed at slowing the rate of progression.  She is already doing a good job of keeping her legs elevated when possible.  I explained the critical importance of continuing with her compression which she is also doing.  I did discuss the possibility of lymphedema therapy and she has contact information for licensed therapist.  Also discussed the treatment option of lymphedema pump  should she have continued progression with this initial conservative treatment.  She will see [place] again on an as an basis.  She is concerned as to whether or not this is limb threatening and I assured her with her normal arterial flow that this should not be a limb threatening condition   She has clearly failed conservative therapy.  She has tried elevation and compression and has done some exercise.  She does have chronic edema in her toes and dorsum of her feet.  She does not have any hyperpigmentation but does have thickening and Litisha Guagliardo signs of hyperkeratosis.   Rosetta Posner, MD FACS [facility] Tel [phone] Pager [phone]

## 2019-08-09 NOTE — Telephone Encounter (Signed)
Pt seen in office today. Stated she had contact information for a massage therapist for lymphedema but not covered by insurance. Requested name or referral for therapist. Information will be sent as requested.

## 2019-08-11 DIAGNOSIS — R2681 Unsteadiness on feet: Secondary | ICD-10-CM | POA: Diagnosis not present

## 2019-08-11 DIAGNOSIS — M79605 Pain in left leg: Secondary | ICD-10-CM | POA: Diagnosis not present

## 2019-08-11 DIAGNOSIS — R262 Difficulty in walking, not elsewhere classified: Secondary | ICD-10-CM | POA: Diagnosis not present

## 2019-08-16 ENCOUNTER — Telehealth: Payer: Self-pay

## 2019-08-22 ENCOUNTER — Encounter: Payer: Self-pay | Admitting: Internal Medicine

## 2019-08-22 ENCOUNTER — Ambulatory Visit (INDEPENDENT_AMBULATORY_CARE_PROVIDER_SITE_OTHER): Payer: Medicare Other | Admitting: Internal Medicine

## 2019-08-22 ENCOUNTER — Other Ambulatory Visit: Payer: Self-pay

## 2019-08-22 VITALS — Temp 97.6°F | Ht 61.0 in | Wt 175.8 lb

## 2019-08-22 DIAGNOSIS — I890 Lymphedema, not elsewhere classified: Secondary | ICD-10-CM

## 2019-08-22 DIAGNOSIS — I25119 Atherosclerotic heart disease of native coronary artery with unspecified angina pectoris: Secondary | ICD-10-CM | POA: Diagnosis not present

## 2019-08-22 DIAGNOSIS — R42 Dizziness and giddiness: Secondary | ICD-10-CM | POA: Diagnosis not present

## 2019-08-22 DIAGNOSIS — K219 Gastro-esophageal reflux disease without esophagitis: Secondary | ICD-10-CM

## 2019-08-22 MED ORDER — ALIGN 4 MG PO CAPS: 4.0000 mg | ORAL_CAPSULE | Freq: Every day | ORAL | 1 refills | Status: DC

## 2019-08-22 NOTE — Progress Notes (Signed)
This visit occurred during the SARS-CoV-2 public health emergency.  Safety protocols were in place, including screening questions prior to the visit, additional usage of staff PPE, and extensive cleaning of exam room while observing appropriate contact time as indicated for disinfecting solutions.  Subjective:     Patient ID: Sheila Reynolds , female    DOB: [birth year] , 74 y.o.   MRN: [id number]   Chief Complaint  Patient presents with  . Gastroesophageal Reflux  . Dizziness    HPI  She is here today for f/u GERD. She was started on Dexilant at her last visit. She stopped the medication b/c she felt worse on the medication.  She has since resumed use of pantoprazole.    Additionally, she has been feeling more dizzy. Her sx occur with positional changes. Denies chest pain, headaches and visual changes. Sx started several days ago. Admits she does not drink a lot of water, but drinks a lot of other things like juices, tea, etc.     Past Medical History:  Diagnosis Date  . Anemia    3 months ago anemic  . Anxiety    on meds  . Arthritis    "all over" (01/15/2017)  . Asthma   . Bronchitis with emphysema   . Chest pain   . [place] bronch[place]   . Coronary artery disease    a. 01/2017 she underwent orbital atherectomy/DES to the proxmal LAD and PTCA to ostial D2. 2D Echo 01/15/17 showed mild LVH, EF 60-65%, grade 1 DD.  . Family history of anesthesia complication    daughter N/V  . Fibromyalgia   . GERD (gastroesophageal reflux disease)    on meds  . Heart murmur   . History of hiatal hernia   . Hx of echocardiogram    Echo (03/2013):  Tech limited; Mild focal basal septal hypertrophy, EF 60-65%, normal RVF  . Hyperlipidemia   . Hypertension   . Nonsustained ventricular tachycardia ([place])   . OSA on CPAP   . Premature atrial contractions   . PVC (premature ventricular contraction)    a. Holter 12/16: NSR, occ PAC,PVCs  . Sarcoidosis   . Type II diabetes mellitus  (HCC)      Family History  Problem Relation Age of Onset  . Heart disease Father   . Diabetes Father   . Glaucoma Father   . Cancer Mother        unknown type, ?lung  . Heart disease Paternal Grandmother   . Cancer Paternal Grandmother        unknown type  . Diabetes Brother   . Glaucoma Brother      Current Outpatient Medications:  .  acetaminophen (TYLENOL) 500 MG tablet, Take 1,000 mg by mouth 2 (two) times daily as needed for moderate pain or headache., Disp: , Rfl:  .  albuterol (PROVENTIL) (2.5 MG/3ML) 0.083% nebulizer solution, Take 3 mLs (2.5 mg total) by nebulization daily as needed for wheezing or shortness of breath., Disp: 75 mL, Rfl: 12 .  albuterol (VENTOLIN HFA) 108 (90 Base) MCG/ACT inhaler, Inhale 2 puffs into the lungs every 6 (six) hours as needed for wheezing or shortness of breath., Disp: 1 Inhaler, Rfl: 12 .  atorvastatin (LIPITOR) 80 MG tablet, Take 1 tablet (80 mg total) by mouth daily., Disp: 90 tablet, Rfl: 3 .  brimonidine (ALPHAGAN P) 0.1 % SOLN, Place 1 drop into both eyes 2 (two) times daily. , Disp: , Rfl:  .  brinzolamide (AZOPT) 1 %  ophthalmic suspension, Place 1 drop into both eyes 3 (three) times daily. , Disp: , Rfl:  .  Carboxymethylcellul-Glycerin (REFRESH OPTIVE OP), Apply to eye., Disp: , Rfl:  .  carvedilol (COREG) 12.5 MG tablet, TAKE 1 TABLET(12.5 MG) BY MOUTH TWICE DAILY, Disp: 180 tablet, Rfl: 3 .  clopidogrel (PLAVIX) 75 MG tablet, Take 1 tablet (75 mg total) by mouth daily., Disp: 90 tablet, Rfl: 3 .  furosemide (LASIX) 40 MG tablet, Take 1 tablet (40 mg total) by mouth every other day. (Patient taking differently: Take 40 mg by mouth every other day. 1 every 3rd day), Disp: 90 tablet, Rfl: 1 .  isosorbide mononitrate (IMDUR) 60 MG 24 hr tablet, Take 1 tablet (60 mg total) by mouth daily., Disp: 90 tablet, Rfl: 3 .  JANUMET 50-500 MG tablet, TAKE 1 TABLET BY MOUTH TWICE DAILY WITH A MEAL, Disp: 180 tablet, Rfl: 1 .  Lancets (ONETOUCH  DELICA PLUS [id number]) MISC, CHECK BLOOD SUGAR BEFORE BREAKFAST AND DINNER, Disp: 100 each, Rfl: 1 .  montelukast (SINGULAIR) 10 MG tablet, TAKE 1 TABLET(10 MG) BY MOUTH EVERY EVENING, Disp: 90 tablet, Rfl: 1 .  Nebulizers (COMPRESSOR/NEBULIZER) MISC, Use as directed, Disp: 1 each, Rfl: 0 .  olmesartan (BENICAR) 20 MG tablet, Take 20 mg by mouth daily., Disp: , Rfl:  .  pantoprazole (PROTONIX) 40 MG tablet, TAKE 1 TABLET(40 MG) BY MOUTH DAILY, Disp: 30 tablet, Rfl: 11 .  potassium chloride SA (KLOR-CON) 20 MEQ tablet, Take 1 tablet (20 mEq total) by mouth every other day. (Patient taking differently: Take 20 mEq by mouth every other day. Only when taking lasix), Disp: 90 tablet, Rfl: 1 .  prednisoLONE acetate (PRED FORTE) 1 % ophthalmic suspension, Place 1 drop into both eyes 4 (four) times daily. , Disp: , Rfl:  .  pregabalin (LYRICA) 75 MG capsule, TAKE 1 CAPSULE(75 MG) BY MOUTH TWICE DAILY, Disp: 180 capsule, Rfl: 1 .  travoprost, benzalkonium, (TRAVATAN) 0.004 % ophthalmic solution, Place 1 drop into the right eye at bedtime. , Disp: , Rfl:  .  nitroGLYCERIN (NITROSTAT) 0.4 MG SL tablet, Place 1 tablet (0.4 mg total) under the tongue every 5 (five) minutes as needed for chest pain. (Patient not taking: Reported on 08/22/2019), Disp: 25 tablet, Rfl: prn .  Probiotic Product (ALIGN) 4 MG CAPS, Take 1 capsule (4 mg total) by mouth daily., Disp: 30 capsule, Rfl: 1   Allergies  Allergen Reactions  . Crestor [Rosuvastatin Calcium] Other (See Comments)    muscle aches  . Demerol  [Meperidine Hcl]     Other reaction(s): Hallucinations  . Shellfish Allergy Itching and Other (See Comments)    Crab, shrimp and lobster ---lips itch and tingle Tingling  Was told not to eat again after having a allergy test. Lobster, crab and shrimp Crab, shrimp and lobster ---lips itch and tingle PATIENT STATES SHE HAD A POSITVE ALLERGY TEST   . Demerol [Meperidine] Other (See Comments)    Hallucinations       Review of Systems  Constitutional: Negative.   Respiratory: Negative.   Cardiovascular: Positive for leg swelling.  Gastrointestinal: Negative.   Neurological: Positive for dizziness.  Psychiatric/Behavioral: Negative.      Today's Vitals   08/22/19 1639  Temp: 97.6 F (36.4 C)  TempSrc: Oral  Weight: 175 lb 12.8 oz (79.7 kg)  Height: 5\' 1"  (1.549 m)  PainSc: 0-No pain   Body mass index is 33.22 kg/m.   Objective:  Physical Exam Vitals and nursing note reviewed.  Constitutional:      Appearance: Normal appearance.  HENT:     Head: Normocephalic and atraumatic.  Cardiovascular:     Rate and Rhythm: Normal rate and regular rhythm.     Heart sounds: Normal heart sounds.  Pulmonary:     Effort: Pulmonary effort is normal.     Breath sounds: Normal breath sounds.  Abdominal:     General: There is distension.  Musculoskeletal:     Right lower leg: 2+ Edema present.     Left lower leg: 3+ Edema present.  Skin:    General: Skin is warm.  Neurological:     General: No focal deficit present.     Mental Status: She is alert.  Psychiatric:        Mood and Affect: Mood normal.        Behavior: Behavior normal.         Assessment And Plan:     1. Gastroesophageal reflux disease without esophagitis  Chronic, she will continue with pantoprazole. She is reminded she must stop eating 3 hours prior to lying down.   2. Dizziness  She is encouraged to stay well hydrated. Encouraged to incorporate some coconut water into her daily routine. Also reminded to stay well hydrated.   3. Lymphedema of both lower extremities  Chronic. She has been evaluated by Dr. Donnetta Hutching, and has a tech coming to her home tomorrow to set her up with lymphedema pump. She is encouraged to keep legs elevated when seated. Also encouraged to increase daily activity as tolerated.   Maximino Greenland, MD   I, Maximino Greenland, MD, have reviewed all documentation for this visit. The documentation on  08/22/19 for the exam, diagnosis, procedures, and orders are all accurate and complete.  THE PATIENT IS ENCOURAGED TO PRACTICE SOCIAL DISTANCING DUE TO THE COVID-19 PANDEMIC.

## 2019-08-22 NOTE — Patient Instructions (Signed)

## 2019-08-25 DIAGNOSIS — R262 Difficulty in walking, not elsewhere classified: Secondary | ICD-10-CM | POA: Diagnosis not present

## 2019-08-25 DIAGNOSIS — R2681 Unsteadiness on feet: Secondary | ICD-10-CM | POA: Diagnosis not present

## 2019-08-25 DIAGNOSIS — M79605 Pain in left leg: Secondary | ICD-10-CM | POA: Diagnosis not present

## 2019-08-28 ENCOUNTER — Other Ambulatory Visit: Payer: Self-pay | Admitting: Internal Medicine

## 2019-09-06 ENCOUNTER — Other Ambulatory Visit: Payer: Self-pay | Admitting: Gastroenterology

## 2019-09-06 DIAGNOSIS — Z86010 Personal history of colonic polyps: Secondary | ICD-10-CM | POA: Diagnosis not present

## 2019-09-06 DIAGNOSIS — K5730 Diverticulosis of large intestine without perforation or abscess without bleeding: Secondary | ICD-10-CM | POA: Diagnosis not present

## 2019-09-06 DIAGNOSIS — E669 Obesity, unspecified: Secondary | ICD-10-CM | POA: Diagnosis not present

## 2019-09-06 DIAGNOSIS — K439 Ventral hernia without obstruction or gangrene: Secondary | ICD-10-CM | POA: Diagnosis not present

## 2019-09-06 DIAGNOSIS — Z1211 Encounter for screening for malignant neoplasm of colon: Secondary | ICD-10-CM | POA: Diagnosis not present

## 2019-09-06 DIAGNOSIS — K219 Gastro-esophageal reflux disease without esophagitis: Secondary | ICD-10-CM | POA: Diagnosis not present

## 2019-09-13 ENCOUNTER — Telehealth: Payer: Self-pay | Admitting: *Deleted

## 2019-09-13 NOTE — Telephone Encounter (Signed)
I called the patient for cardiac clearance. Last PCI was in 2018. She mentions for the past 2 weeks she has been experiencing sharp pinch in her chest that last from a few seconds up to 30 seconds at the longest. No relation to the degree of exertion. No exacerbating factors such as deep inspiration, body rotation or palpitation. Symptom sometimes occurs once every other day, sometimes 2-3 times a day. Will ask Dr. Acie Fredrickson to review to see if he would recommend a office visit or at least EKG prior to clearance.   Also, patient is on aspirin and plavix, Dr. Acie Fredrickson, do you recommend hold plavix and continue on aspirin through colonoscopy or are you ok with holding both?  Please send your response to P CV DIV PREOP

## 2019-09-13 NOTE — Telephone Encounter (Signed)
° °  [facility] Pre-operative Risk Assessment    HEARTCARE STAFF: - Please ensure there is not already an duplicate clearance open for this procedure. - Under Visit Info/Reason for Call, type in Other and utilize the format Clearance MM/DD/YY or Clearance TBD. Do not use dashes or single digits. - If request is for dental extraction, please clarify the # of teeth to be extracted.  Request for surgical clearance:  1. What type of surgery is being performed?  COLONOSCOPY   2. When is this surgery scheduled?  10/04/19   3. What type of clearance is required (medical clearance vs. Pharmacy clearance to hold med vs. Both)?  BOTH  4. Are there any medications that need to be held prior to surgery and how long? PLAVIX & ASPIRIN   5. Practice name and name of physician performing surgery?  [facility] / DR. MANN   6. What is the office phone number?  [phone]   7.   What is the office fax number?  [phone]  8.   Anesthesia type (None, local, MAC, general) ?  PROPOFOL    Sheila Reynolds 09/13/2019, 3:06 PM  _________________________________________________________________   (provider comments below)

## 2019-09-14 ENCOUNTER — Telehealth: Payer: Self-pay | Admitting: *Deleted

## 2019-09-14 NOTE — Telephone Encounter (Signed)
   Primary Cardiologist: Mertie Moores, MD  Chart reviewed as part of pre-operative protocol coverage. Given past medical history and time since last visit, based on ACC/AHA guidelines, Sheila Reynolds would be at acceptable risk for the planned procedure without further cardiovascular testing.   Dr. Acie Fredrickson notes she has had similar chest discomfort in past and was not cardiac.  Though pt instructed if increase of pain to contact our office.   She may hold the ASA and plavix for 5-7 days prior to colonoscopy.  I will route this recommendation to the requesting party via Epic fax function and remove from pre-op pool.  Please call with questions.  [name], NP 09/14/2019, 5:27 PM

## 2019-09-14 NOTE — Telephone Encounter (Signed)
She has had similar pains in the past. I think she is OK to hold the ASA and plavix for 5-7 days prior to colonoscopy and then restart as soon as it is safe from a  GI standpoint

## 2019-09-15 ENCOUNTER — Telehealth: Payer: Self-pay | Admitting: Cardiology

## 2019-09-15 NOTE — Telephone Encounter (Signed)
Call placed to pt regarding surgical clearance for Colonoscopy.  Per pt, she will go ahead and proceed with the procedure and if her symptoms worsen or don't get any better, she will call us back for an appointment.

## 2019-09-15 NOTE — Telephone Encounter (Signed)
Please let pt know that Dr. Acie Fredrickson and it looks like Dr. Baird Cancer her PCP thought the chest pain was her reflux.  If she feels safer being seen we could cancel colonoscopy and set her up for appt but it  May be a month or more.  But that was why Dr. Acie Fredrickson felt it was safe to proceed with low risk procedure.  When I called she seemed unsure.  Just wanted to give her options.

## 2019-09-22 ENCOUNTER — Telehealth: Payer: Self-pay

## 2019-09-22 ENCOUNTER — Ambulatory Visit (INDEPENDENT_AMBULATORY_CARE_PROVIDER_SITE_OTHER): Payer: Medicare Other

## 2019-09-22 ENCOUNTER — Telehealth: Payer: Medicare Other

## 2019-09-22 ENCOUNTER — Other Ambulatory Visit: Payer: Self-pay

## 2019-09-22 DIAGNOSIS — I10 Essential (primary) hypertension: Secondary | ICD-10-CM | POA: Diagnosis not present

## 2019-09-22 DIAGNOSIS — E1165 Type 2 diabetes mellitus with hyperglycemia: Secondary | ICD-10-CM

## 2019-09-22 DIAGNOSIS — I739 Peripheral vascular disease, unspecified: Secondary | ICD-10-CM

## 2019-09-22 DIAGNOSIS — M797 Fibromyalgia: Secondary | ICD-10-CM

## 2019-09-22 DIAGNOSIS — E559 Vitamin D deficiency, unspecified: Secondary | ICD-10-CM

## 2019-09-22 DIAGNOSIS — I890 Lymphedema, not elsewhere classified: Secondary | ICD-10-CM

## 2019-09-22 NOTE — Telephone Encounter (Signed)
   [facility] Pre-operative Risk Assessment    HEARTCARE STAFF: - Please ensure there is not already an duplicate clearance open for this procedure. - Under Visit Info/Reason for Call, type in Other and utilize the format Clearance MM/DD/YY or Clearance TBD. Do not use dashes or single digits. - If request is for dental extraction, please clarify the # of teeth to be extracted.  Request for surgical clearance:  1. What type of surgery is being performed? colonoscopy  2. When is this surgery scheduled? 10/04/2019  3. What type of clearance is required (medical clearance vs. Pharmacy clearance to hold med vs. Both)? both  4. Are there any medications that need to be held prior to surgery and how long? plavix and aspirin  5. Practice name and name of physician performing surgery? [facility]  6. What is the office phone number? [phone]   7.   What is the office fax number? [phone]  8.   Anesthesia type (None, local, MAC, general) ? propofol   Darlyn Chamber Avalon Coppinger 09/22/2019, 9:12 AM  _________________________________________________________________   (provider comments below)

## 2019-09-22 NOTE — Telephone Encounter (Signed)
I forwarded laura Ingold's pre op clearance note.  Kerin Ransom PA-C 09/22/2019 11:27 AM

## 2019-09-26 NOTE — Telephone Encounter (Signed)
complete

## 2019-09-28 ENCOUNTER — Other Ambulatory Visit: Payer: Self-pay

## 2019-09-28 ENCOUNTER — Ambulatory Visit: Payer: Self-pay

## 2019-09-28 ENCOUNTER — Telehealth: Payer: Medicare Other

## 2019-09-28 ENCOUNTER — Encounter (HOSPITAL_COMMUNITY): Payer: Self-pay | Admitting: Gastroenterology

## 2019-09-28 DIAGNOSIS — E1165 Type 2 diabetes mellitus with hyperglycemia: Secondary | ICD-10-CM

## 2019-09-28 DIAGNOSIS — M797 Fibromyalgia: Secondary | ICD-10-CM

## 2019-09-28 DIAGNOSIS — E559 Vitamin D deficiency, unspecified: Secondary | ICD-10-CM

## 2019-09-28 DIAGNOSIS — I739 Peripheral vascular disease, unspecified: Secondary | ICD-10-CM

## 2019-09-28 DIAGNOSIS — I890 Lymphedema, not elsewhere classified: Secondary | ICD-10-CM

## 2019-09-28 DIAGNOSIS — I10 Essential (primary) hypertension: Secondary | ICD-10-CM

## 2019-09-28 NOTE — Progress Notes (Signed)
Ms.Slape stated she left a message with Caryl Pina at Dr. Lorie Apley office this morning to cancel procedure for 10/04/2019 currently being treated for a sinus infection.

## 2019-09-28 NOTE — Patient Instructions (Signed)
Visit Information  Goals Addressed              This Visit's Progress     Patient Stated   .  "to get the lymphedema under control" (pt-stated)        CARE PLAN ENTRY (see longitudinal plan of care for additional care plan information)  Current Barriers:  [name] Knowledge Deficits related to disease process and Self Health management of Lymphedema  . Chronic Disease Management support and education needs related to Uncontrolled type two DM with hyperglycemia, Vitamin D deficiency,  Fibromyalgia, Hypertension, Peripheral Vascular disease  Nurse Case Manager Clinical Goal(s):  [name] Over the next 90 days, patient will work with the [place] and PCP to address needs related to disease education and support for improved Self Health management of Lymphedema  CCM RN CM Interventions:  09/22/19 call completed with patient . Inter-disciplinary care team collaboration (see longitudinal plan of care) . Evaluation of current treatment plan related to lymphedema and patient's adherence to plan as established by provider . Determined Dr. Baird Cancer ordered lymphedema therapy for patient for treatment of bilateral lower extremity lymphedema  . Determined the DME will be supplied by [facility], however the patient has not received the equipment, she stated it was ordered 4-5 weeks ago . Obtained the contact information from patient for [facility] [phone]; personal representative Levora Freja Faro [phone] . Placed outbound call to [employer] [phone], spoke with Elzie Rings who confirmed the lymphedema equipment has been approved and process to be mailed via ground [employer]; discussed the patient should receive the equipment by Tuesday next week at the latest . Notified patient of the expected equipment date and encouraged her to call this RN CM if the DME does not arrive as promise . Discussed plans with patient for ongoing care management follow up and provided patient  with direct contact information for care management team 09/28/19 call completed with patient  . Place outbound call to patient to f/u on receipt of her lymphedema pumps . Determined patient has received the lymphedema pumps and is using them bilaterally as directed . Determined patient has no further questions or concerns at this time  . Encouraged patient to contact [facility] if needs arise pertaining to the lymphedema pumps, otherwise she should reach out to the CCM team  and or PCP for other health related concerns . Discussed plans with patient for ongoing care management follow up and provided patient with direct contact information for care management team  Patient Self Care Activities:  . Self administers medications as prescribed . Attends all scheduled provider appointments . Calls pharmacy for medication refills . Calls provider office for new concerns or questions  Please see past updates related to this goal by clicking on the "Past Updates" button in the selected goal        Patient verbalizes understanding of instructions provided today.   Telephone follow up appointment with care management team member scheduled for: 11/29/19  Barb Merino, RN, BSN, CCM Care Management Coordinator [facility]  Direct Phone: [phone]

## 2019-09-28 NOTE — Patient Instructions (Signed)
Visit Information  Goals Addressed      Patient Stated   .  COMPLETED: "I had a fall and injured my leg" (pt-stated)        CARE PLAN ENTRY (see longtitudinal plan of care for additional care plan information)  Current Barriers:  [name] Knowledge Deficits related to  . Chronic Disease Management support and education needs related to Uncontrolled type two DM with hyperglycemia, Vitamin D deficiency, Fibromyalgia, Hypertension  Nurse Case Manager Clinical Goal(s):  [name] Over the next 90 days, patient will work with CCM RN CM and PCP to address needs related to evaluate and treat L hip and leg pain  Goal Met  . Over the next 90 days, patient will verbalize having improved mobility and decreased pain to her L hip and leg Goal Met  CCM RN CM Interventions:  09/22/19 call completed with patient   . Evaluation of current treatment plan related to recent fall with injury to L hip and leg and patient's adherence to plan as established by provider . Determined patient recovered from her L hip/leg injury and is ambulating w/o difficulty or use of DME . Discussed plans with patient for ongoing care management follow up and provided patient with direct contact information for care management team  Patient Self Care Activities:  . Self administers medications as prescribed . Attends all scheduled provider appointments . Calls pharmacy for medication refills . Performs ADL's independently . Performs IADL's independently . Calls provider office for new concerns or questions  Please see past updates related to this goal by clicking on the "Past Updates" button in the selected goal      .  "to get the lymphedema under control" (pt-stated)   On track     [place] (see longitudinal plan of care for additional care plan information)  Current Barriers:  [name] Knowledge Deficits related to disease process and Self Health management of Lymphedema  . Chronic Disease Management support and education needs  related to Uncontrolled type two DM with hyperglycemia, Vitamin D deficiency,  Fibromyalgia, Hypertension, Peripheral Vascular disease  Nurse Case Manager Clinical Goal(s):  [name] Over the next 90 days, patient will work with the [place] and PCP to address needs related to disease education and support for improved Self Health management of Lymphedema  CCM RN CM Interventions:  09/22/19 call completed with patient . Inter-disciplinary care team collaboration (see longitudinal plan of care) . Evaluation of current treatment plan related to lymphedema and patient's adherence to plan as established by provider . Determined Dr. Baird Cancer ordered lymphedema therapy for patient for treatment of bilateral lower extremity lymphedema  . Determined the DME will be supplied by [facility], however the patient has not received the equipment, she stated it was ordered 4-5 weeks ago . Obtained the contact information from patient for [facility] [phone]; personal representative Levora  [phone] . Placed outbound call to [employer] [phone], spoke with Elzie Rings who confirmed the lymphedema equipment has been approved and process to be mailed via ground [employer]; discussed the patient should receive the equipment by Tuesday next week at the latest . Notified patient of the expected equipment date and encouraged her to call this RN CM if the DME does not arrive as promise . Discussed plans with patient for ongoing care management follow up and provided patient with direct contact information for care management team  Patient Self Care Activities:  . Self administers medications as prescribed . Attends  all scheduled provider appointments . Calls pharmacy for medication refills . Calls provider office for new concerns or questions  Initial goal documentation       Other   .  "I need to have this hernia evaluated"   Not on track     [place] (see  longtitudinal plan of care for additional care plan information)  Current Barriers:  [name] Knowledge Deficits related to evaluation and treatment of Hiatal Hernia  . Chronic Disease Management support and education needs related to Uncontrolled type two DM with hyperglycemia, Vitamin D deficiency, Fibromyalgia, Hypertension, Peripheral Vascular disease  Nurse Case Manager Clinical Goal(s):  [name] Over the next 90 days, patient will work with CCM RN CM and PCP  to address needs related to evaluation and treatment for Hiatal Hernia   Goal Met  . 09/22/19 New Over the next 90 days, patient will verbalize increased understanding and knowledge related to surgical recommendations for treatment of Hiatal Hernia  CCM RN CM Interventions:  09/22/19 call completed with patient  . Evaluation of current treatment plan related to Hiatal Hernia and patient's adherence to plan as established by provider . Determined patient has a hiatal hernia that is enlarged causing her pain and shortness of breath at times . Determined patient followed up with Dr. Collene Mares who advised the hernia may not be operative due to being too large . Discussed patient was referred to Dr. Nadeen Landau, general surgeon who will provide a second opinion for surgical repair of the hernia  . Discussed plans with patient for ongoing care management follow up and provided patient with direct contact information for care management team  Patient Self Care Activities:  . Self administers medications as prescribed . Attends all scheduled provider appointments . Calls pharmacy for medication refills . Performs ADL's independently . Performs IADL's independently . Calls provider office for new concerns or questions  Please see past updates related to this goal by clicking on the "Past Updates" button in the selected goal      .  "I would like to keep my diabetes under control"   On track     [place] (see longtitudinal plan of care for  additional care plan information)  Current Barriers:  [name] Knowledge Deficits related to disease process and Self Health management of diabetes . Chronic Disease Management support and education needs related to Uncontrolled type two DM with hyperglycemia, Vitamin D deficiency, Fibromyalgia, Hypertension, Peripheral Vascular disease  Nurse Case Manager Clinical Goal(s):  . 09/22/19 New Over the next 90 days, patient will work with CCM RN CM and PCP  to address needs related to disease education and support to improve Self Health management of DM   CCM RN CM Interventions:  09/22/19 call completed with patient  . Evaluation of current treatment plan related to Diabetes and patient's adherence to plan as established by provider. . Reinforced education to patient re: A1c remains to be 6.2%; Educated on how to maintain and control DM with adherence to exercise, diet and taking medications exactly as prescribed; Educated on daily glycemic control, FBS 80-130, <180 after meals; patient feels satisfied with her current DM treatment plan   . Reviewed medications with patient and discussed indication, dosage and frequency of Janumet 50-500 mg bid with meals  . Provided patient with printed educational materials related to [employer] with Diabetes; Diabetes Care Schedule; Preventing Complications with Diabetes . Advised patient, providing education and rationale, to check cbg daily before meals and  record, calling CCM RN CM and or PCP for findings outside established parameters  . Discussed plans with patient for ongoing care management follow up and provided patient with direct contact information for care management team  Patient Self Care Activities:  . Self administers medications as prescribed . Attends all scheduled provider appointments . Calls pharmacy for medication refills . Performs ADL's independently . Performs IADL's independently . Calls provider office for new concerns or  questions  Please see past updates related to this goal by clicking on the "Past Updates" button in the selected goal        Patient verbalizes understanding of instructions provided today.   Telephone follow up appointment with care management team member scheduled for: 11/29/19  Barb Merino, RN, BSN, CCM Care Management Coordinator [facility]  Direct Phone: [phone]

## 2019-09-28 NOTE — Chronic Care Management (AMB) (Signed)
Chronic Care Management   Follow Up Note   09/28/2019 Name: Sheila Reynolds MRN: [id number] DOB: [birth year]  Referred by: Glendale Chard, MD Reason for referral : Chronic Care Management (FU RN CM Call )   Sheila Reynolds is a 74 y.o. year old female who is a primary care patient of Glendale Chard, MD. The CCM team was consulted for assistance with chronic disease management and care coordination needs.    Review of patient status, including review of consultants reports, relevant laboratory and other test results, and collaboration with appropriate care team members and the patient's provider was performed as part of comprehensive patient evaluation and provision of chronic care management services.    SDOH (Social Determinants of Health) assessments performed: No  See Care Plan activities for detailed interventions related to [place])   Placed outbound call to patient to f/u on her DME needs.     Outpatient Encounter Medications as of 09/28/2019  Medication Sig  . acetaminophen (TYLENOL) 500 MG tablet Take 1,000 mg by mouth 2 (two) times daily as needed for moderate pain or headache.  . albuterol (PROVENTIL) (2.5 MG/3ML) 0.083% nebulizer solution Take 3 mLs (2.5 mg total) by nebulization daily as needed for wheezing or shortness of breath.  [name] albuterol (VENTOLIN HFA) 108 (90 Base) MCG/ACT inhaler Inhale 2 puffs into the lungs every 6 (six) hours as needed for wheezing or shortness of breath.  [name] atorvastatin (LIPITOR) 80 MG tablet Take 1 tablet (80 mg total) by mouth daily.  . brimonidine (ALPHAGAN P) 0.1 % SOLN Place 1 drop into both eyes 2 (two) times daily.   . brinzolamide (AZOPT) 1 % ophthalmic suspension Place 1 drop into both eyes 3 (three) times daily.   . Carboxymethylcellul-Glycerin (REFRESH OPTIVE OP) Apply to eye.  . carvedilol (COREG) 12.5 MG tablet TAKE 1 TABLET(12.5 MG) BY MOUTH TWICE DAILY  . clopidogrel (PLAVIX) 75 MG tablet Take 1 tablet (75 mg total) by mouth  daily.  . furosemide (LASIX) 40 MG tablet Take 1 tablet (40 mg total) by mouth every other day. (Patient taking differently: Take 40 mg by mouth every other day. 1 every 3rd day)  . isosorbide mononitrate (IMDUR) 60 MG 24 hr tablet Take 1 tablet (60 mg total) by mouth daily.  [name] JANUMET 50-500 MG tablet TAKE 1 TABLET BY MOUTH TWICE DAILY WITH A MEAL  . Lancets (ONETOUCH DELICA PLUS [id number]) MISC CHECK BLOOD SUGAR BEFORE BREAKFAST AND DINNER  . montelukast (SINGULAIR) 10 MG tablet TAKE 1 TABLET(10 MG) BY MOUTH EVERY EVENING  . Nebulizers (COMPRESSOR/NEBULIZER) MISC Use as directed  . nitroGLYCERIN (NITROSTAT) 0.4 MG SL tablet Place 1 tablet (0.4 mg total) under the tongue every 5 (five) minutes as needed for chest pain. (Patient not taking: Reported on 08/22/2019)  . olmesartan (BENICAR) 20 MG tablet Take 20 mg by mouth daily.  . pantoprazole (PROTONIX) 40 MG tablet TAKE 1 TABLET(40 MG) BY MOUTH DAILY  . potassium chloride SA (KLOR-CON) 20 MEQ tablet Take 1 tablet (20 mEq total) by mouth every other day. (Patient taking differently: Take 20 mEq by mouth every other day. Only when taking lasix)  . prednisoLONE acetate (PRED FORTE) 1 % ophthalmic suspension Place 1 drop into both eyes 4 (four) times daily.   . pregabalin (LYRICA) 75 MG capsule TAKE 1 CAPSULE(75 MG) BY MOUTH TWICE DAILY  . Probiotic Product (ALIGN) 4 MG CAPS Take 1 capsule (4 mg total) by mouth daily.  . travoprost, benzalkonium, (TRAVATAN) 0.004 % ophthalmic solution  Place 1 drop into the right eye at bedtime.    No facility-administered encounter medications on file as of 09/28/2019.     Objective:  Lab Results  Component Value Date   HGBA1C 6.2 (H) 08/02/2019   HGBA1C 6.2 (H) 03/23/2019   HGBA1C 6.1 (H) 11/23/2018   Lab Results  Component Value Date   MICROALBUR 10 03/23/2019   LDLCALC 73 03/23/2019   CREATININE 0.86 07/07/2019   BP Readings from Last 3 Encounters:  08/09/19 (!) 152/92  07/25/19 116/78  06/16/19  132/68    Goals Addressed      Patient Stated   .  "to get the lymphedema under control" (pt-stated)        [place] (see longitudinal plan of care for additional care plan information)  Current Barriers:  [name] Knowledge Deficits related to disease process and Self Health management of Lymphedema  . Chronic Disease Management support and education needs related to Uncontrolled type two DM with hyperglycemia, Vitamin D deficiency,  Fibromyalgia, Hypertension, Peripheral Vascular disease  Nurse Case Manager Clinical Goal(s):  [name] Over the next 90 days, patient will work with the [place] and PCP to address needs related to disease education and support for improved Self Health management of Lymphedema  CCM RN CM Interventions:  09/22/19 call completed with patient . Inter-disciplinary care team collaboration (see longitudinal plan of care) . Evaluation of current treatment plan related to lymphedema and patient's adherence to plan as established by provider . Determined Dr. Baird Cancer ordered lymphedema therapy for patient for treatment of bilateral lower extremity lymphedema  . Determined the DME will be supplied by [facility], however the patient has not received the equipment, she stated it was ordered 4-5 weeks ago . Obtained the contact information from patient for [facility] [phone]; personal representative Levora Annahi Short [phone] . Placed outbound call to [employer] [phone], spoke with Elzie Rings who confirmed the lymphedema equipment has been approved and process to be mailed via ground [employer]; discussed the patient should receive the equipment by Tuesday next week at the latest . Notified patient of the expected equipment date and encouraged her to call this RN CM if the DME does not arrive as promise . Discussed plans with patient for ongoing care management follow up and provided patient with direct contact information for care  management team 09/28/19 call completed with patient  . Place outbound call to patient to f/u on receipt of her lymphedema pumps . Determined patient has received the lymphedema pumps and is using them bilaterally as directed . Determined patient has no further questions or concerns at this time  . Encouraged patient to contact [facility] if needs arise pertaining to the lymphedema pumps, otherwise she should reach out to the CCM team  and or PCP for other health related concerns . Discussed plans with patient for ongoing care management follow up and provided patient with direct contact information for care management team  Patient Self Care Activities:  . Self administers medications as prescribed . Attends all scheduled provider appointments . Calls pharmacy for medication refills . Calls provider office for new concerns or questions  Please see past updates related to this goal by clicking on the "Past Updates" button in the selected goal        Plan:   Telephone follow up appointment with care management team member scheduled for: 11/29/19  Barb Merino, RN, BSN, CCM Care Management Coordinator [facility]  Direct Phone: [phone]

## 2019-09-28 NOTE — Chronic Care Management (AMB) (Signed)
Chronic Care Management   Follow Up Note   09/22/2019 Name: CATY TESSLER MRN: [id number] DOB: [birth year]  Referred by: Glendale Chard, MD Reason for referral : Chronic Care Management (FU RN CM Call)   ABBEGAYLE DENAULT is a 74 y.o. year old female who is a primary care patient of Glendale Chard, MD. The CCM team was consulted for assistance with chronic disease management and care coordination needs.    Review of patient status, including review of consultants reports, relevant laboratory and other test results, and collaboration with appropriate care team members and the patient's provider was performed as part of comprehensive patient evaluation and provision of chronic care management services.    SDOH (Social Determinants of Health) assessments performed: Yes - no acute challenges identified See Care Plan activities for detailed interventions related to [place])   Placed outbound CCM RN CM call to patient for a care plan update.     Outpatient Encounter Medications as of 09/22/2019  Medication Sig  . acetaminophen (TYLENOL) 500 MG tablet Take 1,000 mg by mouth 2 (two) times daily as needed for moderate pain or headache.  . albuterol (PROVENTIL) (2.5 MG/3ML) 0.083% nebulizer solution Take 3 mLs (2.5 mg total) by nebulization daily as needed for wheezing or shortness of breath.  [name] albuterol (VENTOLIN HFA) 108 (90 Base) MCG/ACT inhaler Inhale 2 puffs into the lungs every 6 (six) hours as needed for wheezing or shortness of breath.  [name] atorvastatin (LIPITOR) 80 MG tablet Take 1 tablet (80 mg total) by mouth daily.  . brimonidine (ALPHAGAN P) 0.1 % SOLN Place 1 drop into both eyes 2 (two) times daily.   . brinzolamide (AZOPT) 1 % ophthalmic suspension Place 1 drop into both eyes 3 (three) times daily.   . Carboxymethylcellul-Glycerin (REFRESH OPTIVE OP) Apply to eye.  . carvedilol (COREG) 12.5 MG tablet TAKE 1 TABLET(12.5 MG) BY MOUTH TWICE DAILY  . clopidogrel (PLAVIX) 75 MG tablet  Take 1 tablet (75 mg total) by mouth daily.  . isosorbide mononitrate (IMDUR) 60 MG 24 hr tablet Take 1 tablet (60 mg total) by mouth daily.  [name] JANUMET 50-500 MG tablet TAKE 1 TABLET BY MOUTH TWICE DAILY WITH A MEAL  . Lancets (ONETOUCH DELICA PLUS [id number]) MISC CHECK BLOOD SUGAR BEFORE BREAKFAST AND DINNER  . montelukast (SINGULAIR) 10 MG tablet TAKE 1 TABLET(10 MG) BY MOUTH EVERY EVENING  . Nebulizers (COMPRESSOR/NEBULIZER) MISC Use as directed  . nitroGLYCERIN (NITROSTAT) 0.4 MG SL tablet Place 1 tablet (0.4 mg total) under the tongue every 5 (five) minutes as needed for chest pain. (Patient not taking: Reported on 08/22/2019)  . olmesartan (BENICAR) 20 MG tablet Take 20 mg by mouth daily.  . pantoprazole (PROTONIX) 40 MG tablet TAKE 1 TABLET(40 MG) BY MOUTH DAILY  . potassium chloride SA (KLOR-CON) 20 MEQ tablet Take 1 tablet (20 mEq total) by mouth every other day. (Patient taking differently: Take 20 mEq by mouth every other day. Only when taking lasix)  . prednisoLONE acetate (PRED FORTE) 1 % ophthalmic suspension Place 1 drop into both eyes 4 (four) times daily.   . pregabalin (LYRICA) 75 MG capsule TAKE 1 CAPSULE(75 MG) BY MOUTH TWICE DAILY  . Probiotic Product (ALIGN) 4 MG CAPS Take 1 capsule (4 mg total) by mouth daily.  . travoprost, benzalkonium, (TRAVATAN) 0.004 % ophthalmic solution Place 1 drop into the right eye at bedtime.    No facility-administered encounter medications on file as of 09/22/2019.     Objective:  Lab  Results  Component Value Date   HGBA1C 6.2 (H) 08/02/2019   HGBA1C 6.2 (H) 03/23/2019   HGBA1C 6.1 (H) 11/23/2018   Lab Results  Component Value Date   MICROALBUR 10 03/23/2019   LDLCALC 73 03/23/2019   CREATININE 0.86 07/07/2019   BP Readings from Last 3 Encounters:  08/09/19 (!) 152/92  07/25/19 116/78  06/16/19 132/68    Goals Addressed      Patient Stated   .  COMPLETED: "I had a fall and injured my leg" (pt-stated)        CARE PLAN  ENTRY (see longtitudinal plan of care for additional care plan information)  Current Barriers:  [name] Knowledge Deficits related to  . Chronic Disease Management support and education needs related to Uncontrolled type two DM with hyperglycemia, Vitamin D deficiency, Fibromyalgia, Hypertension  Nurse Case Manager Clinical Goal(s):  [name] Over the next 90 days, patient will work with CCM RN CM and PCP to address needs related to evaluate and treat L hip and leg pain  Goal Met  . Over the next 90 days, patient will verbalize having improved mobility and decreased pain to her L hip and leg Goal Met  CCM RN CM Interventions:  09/22/19 call completed with patient   . Evaluation of current treatment plan related to recent fall with injury to L hip and leg and patient's adherence to plan as established by provider . Determined patient recovered from her L hip/leg injury and is ambulating w/o difficulty or use of DME . Discussed plans with patient for ongoing care management follow up and provided patient with direct contact information for care management team  Patient Self Care Activities:  . Self administers medications as prescribed . Attends all scheduled provider appointments . Calls pharmacy for medication refills . Performs ADL's independently . Performs IADL's independently . Calls provider office for new concerns or questions  Please see past updates related to this goal by clicking on the "Past Updates" button in the selected goal      .  "to get the lymphedema under control" (pt-stated)   On track     [place] (see longitudinal plan of care for additional care plan information)  Current Barriers:  [name] Knowledge Deficits related to disease process and Self Health management of Lymphedema  . Chronic Disease Management support and education needs related to Uncontrolled type two DM with hyperglycemia, Vitamin D deficiency,  Fibromyalgia, Hypertension, Peripheral Vascular  disease  Nurse Case Manager Clinical Goal(s):  [name] Over the next 90 days, patient will work with the [place] and PCP to address needs related to disease education and support for improved Self Health management of Lymphedema  CCM RN CM Interventions:  09/22/19 call completed with patient . Inter-disciplinary care team collaboration (see longitudinal plan of care) . Evaluation of current treatment plan related to lymphedema and patient's adherence to plan as established by provider . Determined Dr. Baird Cancer ordered lymphedema therapy for patient for treatment of bilateral lower extremity lymphedema  . Determined the DME will be supplied by [facility], however the patient has not received the equipment, she stated it was ordered 4-5 weeks ago . Obtained the contact information from patient for [facility] [phone]; personal representative [place]  [phone] . Placed outbound call to [employer] [phone], spoke with Elzie Rings who confirmed the lymphedema equipment has been approved and process to be mailed via ground [employer]; discussed the patient should receive the equipment by Tuesday  next week at the latest . Notified patient of the expected equipment date and encouraged her to call this RN CM if the DME does not arrive as promise . Discussed plans with patient for ongoing care management follow up and provided patient with direct contact information for care management team  Patient Self Care Activities:  . Self administers medications as prescribed . Attends all scheduled provider appointments . Calls pharmacy for medication refills . Calls provider office for new concerns or questions  Initial goal documentation       Other   .  "I need to have this hernia evaluated"   Not on track     [place] (see longtitudinal plan of care for additional care plan information)  Current Barriers:  [name] Knowledge Deficits related to evaluation and  treatment of Hiatal Hernia  . Chronic Disease Management support and education needs related to Uncontrolled type two DM with hyperglycemia, Vitamin D deficiency, Fibromyalgia, Hypertension, Peripheral Vascular disease  Nurse Case Manager Clinical Goal(s):  [name] Over the next 90 days, patient will work with CCM RN CM and PCP  to address needs related to evaluation and treatment for Hiatal Hernia   Goal Met  . 09/22/19 New Over the next 90 days, patient will verbalize increased understanding and knowledge related to surgical recommendations for treatment of Hiatal Hernia  CCM RN CM Interventions:  09/22/19 call completed with patient  . Evaluation of current treatment plan related to Hiatal Hernia and patient's adherence to plan as established by provider . Determined patient has a hiatal hernia that is enlarged causing her pain and shortness of breath at times . Determined patient followed up with Dr. Collene Mares who advised the hernia may not be operative due to being too large . Discussed patient was referred to Dr. Nadeen Landau, general surgeon who will provide a second opinion for surgical repair of the hernia  . Discussed plans with patient for ongoing care management follow up and provided patient with direct contact information for care management team  Patient Self Care Activities:  . Self administers medications as prescribed . Attends all scheduled provider appointments . Calls pharmacy for medication refills . Performs ADL's independently . Performs IADL's independently . Calls provider office for new concerns or questions  Please see past updates related to this goal by clicking on the "Past Updates" button in the selected goal      .  "I would like to keep my diabetes under control"   On track     [place] (see longtitudinal plan of care for additional care plan information)  Current Barriers:  [name] Knowledge Deficits related to disease process and Self Health management of  diabetes . Chronic Disease Management support and education needs related to Uncontrolled type two DM with hyperglycemia, Vitamin D deficiency, Fibromyalgia, Hypertension, Peripheral Vascular disease  Nurse Case Manager Clinical Goal(s):  . 09/22/19 New Over the next 90 days, patient will work with CCM RN CM and PCP  to address needs related to disease education and support to improve Self Health management of DM   CCM RN CM Interventions:  09/22/19 call completed with patient  . Evaluation of current treatment plan related to Diabetes and patient's adherence to plan as established by provider. . Reinforced education to patient re: A1c remains to be 6.2%; Educated on how to maintain and control DM with adherence to exercise, diet and taking medications exactly as prescribed; Educated on daily glycemic control, FBS 80-130, <180 after  meals; patient feels satisfied with her current DM treatment plan   . Reviewed medications with patient and discussed indication, dosage and frequency of Janumet 50-500 mg bid with meals  . Provided patient with printed educational materials related to [employer] with Diabetes; Diabetes Care Schedule; Preventing Complications with Diabetes . Advised patient, providing education and rationale, to check cbg daily before meals and record, calling CCM RN CM and or PCP for findings outside established parameters  . Discussed plans with patient for ongoing care management follow up and provided patient with direct contact information for care management team  Patient Self Care Activities:  . Self administers medications as prescribed . Attends all scheduled provider appointments . Calls pharmacy for medication refills . Performs ADL's independently . Performs IADL's independently . Calls provider office for new concerns or questions  Please see past updates related to this goal by clicking on the "Past Updates" button in the selected goal        Plan:    Telephone follow up call with a CCM team member is scheduled for: 11/29/19  Barb Merino, RN, BSN, CCM Care Management Coordinator [place] [facility]  Direct Phone: [phone]

## 2019-09-30 ENCOUNTER — Other Ambulatory Visit (HOSPITAL_COMMUNITY): Payer: Medicare Other | Attending: Gastroenterology

## 2019-09-30 DIAGNOSIS — H401122 Primary open-angle glaucoma, left eye, moderate stage: Secondary | ICD-10-CM | POA: Diagnosis not present

## 2019-09-30 DIAGNOSIS — H401113 Primary open-angle glaucoma, right eye, severe stage: Secondary | ICD-10-CM | POA: Diagnosis not present

## 2019-09-30 NOTE — Progress Notes (Signed)
Spoke to [place] at Dr. Lorie Apley office. She was not aware of pt's attempt to cancel the procedure, as she has been trying to contact the patient, to discuss the holding of her Plavix. Caryl Pina stated that she will reach out to the patient.

## 2019-10-04 ENCOUNTER — Encounter (HOSPITAL_COMMUNITY): Admission: RE | Payer: Self-pay | Source: Home / Self Care

## 2019-10-04 ENCOUNTER — Ambulatory Visit (HOSPITAL_COMMUNITY): Admission: RE | Admit: 2019-10-04 | Payer: Medicare Other | Source: Home / Self Care | Admitting: Gastroenterology

## 2019-10-04 HISTORY — DX: Unspecified glaucoma: H40.9

## 2019-10-04 SURGERY — COLONOSCOPY WITH PROPOFOL
Anesthesia: Monitor Anesthesia Care

## 2019-10-11 DIAGNOSIS — M6208 Separation of muscle (nontraumatic), other site: Secondary | ICD-10-CM | POA: Diagnosis not present

## 2019-11-10 ENCOUNTER — Other Ambulatory Visit: Payer: Self-pay | Admitting: Cardiovascular Disease

## 2019-11-18 DIAGNOSIS — Z20828 Contact with and (suspected) exposure to other viral communicable diseases: Secondary | ICD-10-CM | POA: Diagnosis not present

## 2019-11-22 DIAGNOSIS — Z23 Encounter for immunization: Secondary | ICD-10-CM | POA: Diagnosis not present

## 2019-11-28 ENCOUNTER — Other Ambulatory Visit: Payer: Self-pay | Admitting: Internal Medicine

## 2019-11-28 ENCOUNTER — Ambulatory Visit (INDEPENDENT_AMBULATORY_CARE_PROVIDER_SITE_OTHER): Payer: Medicare Other | Admitting: Internal Medicine

## 2019-11-28 ENCOUNTER — Encounter: Payer: Self-pay | Admitting: Internal Medicine

## 2019-11-28 ENCOUNTER — Other Ambulatory Visit: Payer: Self-pay

## 2019-11-28 VITALS — BP 130/68 | HR 74 | Temp 98.1°F | Ht 64.0 in | Wt 175.8 lb

## 2019-11-28 DIAGNOSIS — I10 Essential (primary) hypertension: Secondary | ICD-10-CM | POA: Diagnosis not present

## 2019-11-28 DIAGNOSIS — J449 Chronic obstructive pulmonary disease, unspecified: Secondary | ICD-10-CM

## 2019-11-28 DIAGNOSIS — I4729 Other ventricular tachycardia: Secondary | ICD-10-CM

## 2019-11-28 DIAGNOSIS — I25119 Atherosclerotic heart disease of native coronary artery with unspecified angina pectoris: Secondary | ICD-10-CM

## 2019-11-28 DIAGNOSIS — I700 Atherosclerosis of aorta: Secondary | ICD-10-CM | POA: Diagnosis not present

## 2019-11-28 DIAGNOSIS — E6609 Other obesity due to excess calories: Secondary | ICD-10-CM | POA: Diagnosis not present

## 2019-11-28 DIAGNOSIS — I5032 Chronic diastolic (congestive) heart failure: Secondary | ICD-10-CM

## 2019-11-28 DIAGNOSIS — I472 Ventricular tachycardia: Secondary | ICD-10-CM | POA: Diagnosis not present

## 2019-11-28 DIAGNOSIS — R002 Palpitations: Secondary | ICD-10-CM | POA: Diagnosis not present

## 2019-11-28 DIAGNOSIS — E1165 Type 2 diabetes mellitus with hyperglycemia: Secondary | ICD-10-CM

## 2019-11-28 DIAGNOSIS — J441 Chronic obstructive pulmonary disease with (acute) exacerbation: Secondary | ICD-10-CM

## 2019-11-28 DIAGNOSIS — R0602 Shortness of breath: Secondary | ICD-10-CM | POA: Diagnosis not present

## 2019-11-28 DIAGNOSIS — Z6830 Body mass index (BMI) 30.0-30.9, adult: Secondary | ICD-10-CM | POA: Diagnosis not present

## 2019-11-28 DIAGNOSIS — I890 Lymphedema, not elsewhere classified: Secondary | ICD-10-CM

## 2019-11-28 NOTE — Patient Instructions (Signed)
Palpitations Palpitations are feelings that your heartbeat is not normal. Your heartbeat may feel like it is:  Uneven.  Faster than normal.  Fluttering.  Skipping a beat. This is usually not a serious problem. In some cases, you may need tests to rule out any serious problems. Follow these instructions at home: Pay attention to any changes in your condition. Take these actions to help manage your symptoms: Eating and drinking  Avoid: ? Coffee, tea, soft drinks, and energy drinks. ? Chocolate. ? Alcohol. ? Diet pills. Lifestyle   Try to lower your stress. These things can help you relax: ? Yoga. ? Deep breathing and meditation. ? Exercise. ? Using words and images to create positive thoughts (guided imagery). ? Using your mind to control things in your body (biofeedback).  Do not use drugs.  Get plenty of rest and sleep. Keep a regular bed time. General instructions   Take over-the-counter and prescription medicines only as told by your doctor.  Do not use any products that contain nicotine or tobacco, such as cigarettes and e-cigarettes. If you need help quitting, ask your doctor.  Keep all follow-up visits as told by your doctor. This is important. You may need more tests if palpitations do not go away or get worse. Contact a doctor if:  Your symptoms last more than 24 hours.  Your symptoms occur more often. Get help right away if you:  Have chest pain.  Feel short of breath.  Have a very bad headache.  Feel dizzy.  Pass out (faint). Summary  Palpitations are feelings that your heartbeat is uneven or faster than normal. It may feel like your heart is fluttering or skipping a beat.  Avoid food and drinks that may cause palpitations. These include caffeine, chocolate, and alcohol.  Try to lower your stress. Do not smoke or use drugs.  Get help right away if you faint or have chest pain, shortness of breath, a severe headache, or dizziness. This  information is not intended to replace advice given to you by your health care provider. Make sure you discuss any questions you have with your health care provider. Document Revised: 03/11/2017 Document Reviewed: 03/11/2017 Elsevier Patient Education  2020 [employer]  

## 2019-11-28 NOTE — Progress Notes (Signed)
Sheila Reynolds as a Education administrator for Sheila Greenland, MD.,have documented all relevant documentation on the behalf of Sheila Greenland, MD,as directed by  Sheila Greenland, MD while in the presence of Sheila Greenland, MD.  This visit occurred during the SARS-CoV-2 public health emergency.  Safety protocols were in place, including screening questions prior to the visit, additional usage of staff PPE, and extensive cleaning of exam room while observing appropriate contact time as indicated for disinfecting solutions.  Subjective:     Patient ID: Sheila Reynolds , female    DOB: Dec 26, 1945 , 74 y.o.   MRN: [id number]   Chief Complaint  Patient presents with  . Diabetes  . Hypertension    HPI  She presents today for DM/HTN check. She reports compliance with meds. Admits she has been having some palpitations. Not sure what is triggered her sx. Denies chest pain. She has not discussed her recent symptoms with her cardiologist.   Diabetes She presents for her follow-up diabetic visit. She has type 2 diabetes mellitus. Her disease course has been stable. There are no hypoglycemic associated symptoms. Pertinent negatives for diabetes include no blurred vision and no chest pain. There are no hypoglycemic complications. Diabetic complications include heart disease. Her weight is stable. Her breakfast blood glucose is taken between 7-8 am. Her breakfast blood glucose range is generally 90-110 mg/dl. An ACE inhibitor/angiotensin II receptor blocker is being taken. Eye exam is current.  Hypertension This is a chronic problem. The current episode started more than 1 year ago. The problem has been gradually improving since onset. The problem is controlled. Associated symptoms include palpitations. Pertinent negatives include no blurred vision or chest pain. Risk factors for coronary artery disease include diabetes mellitus, dyslipidemia, obesity, post-menopausal state and sedentary lifestyle.     Past  Medical History:  Diagnosis Date  . Anemia    3 months ago anemic  . Anxiety    on meds  . Arthritis    "all over" (01/15/2017)  . Asthma   . Bronchitis with emphysema   . Chest pain   . Chronic bronchitis ([place])   . Coronary artery disease    a. 01/2017 she underwent orbital atherectomy/DES to the proxmal LAD and PTCA to ostial D2. 2D Echo 01/15/17 showed mild LVH, EF 60-65%, grade 1 DD.  . Family history of anesthesia complication    daughter N/V  . Fibromyalgia   . GERD (gastroesophageal reflux disease)    on meds  . Glaucoma   . Heart murmur   . History of hiatal hernia   . Hx of echocardiogram    Echo (03/2013):  Tech limited; Mild focal basal septal hypertrophy, EF 60-65%, normal RVF  . Hyperlipidemia   . Hypertension   . Nonsustained ventricular tachycardia ([place])   . OSA on CPAP   . Premature atrial contractions   . PVC (premature ventricular contraction)    a. Holter 12/16: NSR, occ PAC,PVCs  . Sarcoidosis   . Type II diabetes mellitus (HCC)      Family History  Problem Relation Age of Onset  . Heart disease Father   . Diabetes Father   . Glaucoma Father   . Cancer Mother        unknown type, ?lung  . Heart disease Paternal Grandmother   . Cancer Paternal Grandmother        unknown type  . Diabetes Brother   . Glaucoma Brother      Current Outpatient Medications:  .  acetaminophen (TYLENOL) 500 MG tablet, Take 1,000 mg by mouth 2 (two) times daily as needed for moderate pain or headache., Disp: , Rfl:  .  albuterol (PROVENTIL) (2.5 MG/3ML) 0.083% nebulizer solution, Take 3 mLs (2.5 mg total) by nebulization daily as needed for wheezing or shortness of breath., Disp: 75 mL, Rfl: 12 .  albuterol (VENTOLIN HFA) 108 (90 Base) MCG/ACT inhaler, Inhale 2 puffs into the lungs every 6 (six) hours as needed for wheezing or shortness of breath., Disp: 1 Inhaler, Rfl: 12 .  atorvastatin (LIPITOR) 80 MG tablet, TAKE 1 TABLET(80 MG) BY MOUTH DAILY, Disp: 90 tablet, Rfl:  2 .  brimonidine (ALPHAGAN P) 0.1 % SOLN, Place 1 drop into both eyes 2 (two) times daily. , Disp: , Rfl:  .  brinzolamide (AZOPT) 1 % ophthalmic suspension, Place 1 drop into both eyes 3 (three) times daily. , Disp: , Rfl:  .  Carboxymethylcellul-Glycerin (REFRESH OPTIVE OP), Apply to eye., Disp: , Rfl:  .  carvedilol (COREG) 12.5 MG tablet, TAKE 1 TABLET(12.5 MG) BY MOUTH TWICE DAILY, Disp: 180 tablet, Rfl: 3 .  clopidogrel (PLAVIX) 75 MG tablet, Take 1 tablet (75 mg total) by mouth daily., Disp: 90 tablet, Rfl: 3 .  isosorbide mononitrate (IMDUR) 60 MG 24 hr tablet, Take 1 tablet (60 mg total) by mouth daily., Disp: 90 tablet, Rfl: 3 .  JANUMET 50-500 MG tablet, TAKE 1 TABLET BY MOUTH TWICE DAILY WITH A MEAL, Disp: 180 tablet, Rfl: 1 .  Lancets (ONETOUCH DELICA PLUS [id number]) MISC, CHECK BLOOD SUGAR BEFORE BREAKFAST AND DINNER, Disp: 100 each, Rfl: 1 .  montelukast (SINGULAIR) 10 MG tablet, TAKE 1 TABLET(10 MG) BY MOUTH EVERY EVENING, Disp: 90 tablet, Rfl: 1 .  Nebulizers (COMPRESSOR/NEBULIZER) MISC, Use as directed, Disp: 1 each, Rfl: 0 .  nitroGLYCERIN (NITROSTAT) 0.4 MG SL tablet, Place 1 tablet (0.4 mg total) under the tongue every 5 (five) minutes as needed for chest pain. (Patient not taking: Reported on 11/29/2019), Disp: 25 tablet, Rfl: prn .  pantoprazole (PROTONIX) 40 MG tablet, TAKE 1 TABLET(40 MG) BY MOUTH DAILY, Disp: 30 tablet, Rfl: 11 .  potassium chloride SA (KLOR-CON) 20 MEQ tablet, Take 1 tablet (20 mEq total) by mouth every other day. (Patient taking differently: Take 20 mEq by mouth every other day. Only when taking lasix), Disp: 90 tablet, Rfl: 1 .  prednisoLONE acetate (PRED FORTE) 1 % ophthalmic suspension, Place 1 drop into both eyes 4 (four) times daily. , Disp: , Rfl:  .  pregabalin (LYRICA) 75 MG capsule, TAKE 1 CAPSULE(75 MG) BY MOUTH TWICE DAILY, Disp: 180 capsule, Rfl: 1 .  Probiotic Product (ALIGN) 4 MG CAPS, Take 1 capsule (4 mg total) by mouth daily. (Patient not  taking: Reported on 11/29/2019), Disp: 30 capsule, Rfl: 1 .  travoprost, benzalkonium, (TRAVATAN) 0.004 % ophthalmic solution, Place 1 drop into the right eye at bedtime. , Disp: , Rfl:  .  furosemide (LASIX) 40 MG tablet, Take 1 tablet (40 mg total) by mouth every other day. (Patient taking differently: Take 40 mg by mouth every other day. 1 every 3rd day), Disp: 90 tablet, Rfl: 1   Allergies  Allergen Reactions  . Crestor [Rosuvastatin Calcium] Other (See Comments)    muscle aches  . Demerol  [Meperidine Hcl]     Other reaction(s): Hallucinations  . Shellfish Allergy Itching and Other (See Comments)    Crab, shrimp and lobster ---lips itch and tingle Was told not to eat again after having a allergy  test. Lobster, crab and shrimp   . Demerol [Meperidine] Other (See Comments)    Hallucinations      Review of Systems  Constitutional: Negative.   Eyes: Negative for blurred vision.  Cardiovascular: Positive for palpitations and leg swelling. Negative for chest pain.  Gastrointestinal: Negative.   Genitourinary: Negative.   Musculoskeletal: Positive for back pain.       Right foot pain  Psychiatric/Behavioral: Negative.   All other systems reviewed and are negative.    Today's Vitals   11/28/19 1147  BP: 130/68  Pulse: 74  Temp: 98.1 F (36.7 C)  TempSrc: Oral  SpO2: 98%  Weight: 175 lb 12.8 oz (79.7 kg)  Height: 5' 4"  (1.626 m)  PainSc: 8   PainLoc: Back   Body mass index is 30.18 kg/m.  Wt Readings from Last 3 Encounters:  11/28/19 175 lb 12.8 oz (79.7 kg)  08/22/19 175 lb 12.8 oz (79.7 kg)  08/09/19 173 lb (78.5 kg)   Objective:  Physical Exam Vitals and nursing note reviewed.  Constitutional:      Appearance: Normal appearance. She is obese.  HENT:     Head: Normocephalic and atraumatic.  Cardiovascular:     Rate and Rhythm: Normal rate and regular rhythm.     Heart sounds: Normal heart sounds.  Pulmonary:     Breath sounds: Normal breath sounds.  Skin:     General: Skin is warm.  Neurological:     General: No focal deficit present.     Mental Status: She is alert and oriented to person, place, and time.         Assessment And Plan:     1. Uncontrolled type 2 diabetes mellitus with hyperglycemia (HCC) Comments: Chronic, I will check labs as listed below. Encouraged to follow dietary recommendations.  Diabetic foot exam was also performed. - Hemoglobin A1c  2. Essential hypertension Comments: Chronic, controlled. She will continue with current meds. She is encouraged to avoid adding salt to her foods.   3. Palpitations Comments: EKG performed, NSR. She would likely benefit from magnesium supplementation.  - BMP8+EGFR - EKG 12-Lead - Magnesium - TSH  4. Chronic obstructive pulmonary disease, unspecified COPD type ([place]) Comments: Chronic, she is also followed by Pulmonary.   5. Aortic atherosclerosis (HCC) Comments: Chronic. Importance of statin compliance was discussed with the patient. Encouraged to follow a heart healthy lifestyle.   6. NSVT (nonsustained ventricular tachycardia) ([place]) Comments: Also followed by Cardiology. Will consider magnesium supplementation.   7. Lymphedema of both lower extremities Comments: Chronic, now followed by Vascular. Again, compliance with compression hose was discussed with the patient.   8. Class 1 obesity due to excess calories with serious comorbidity and body mass index (BMI) of 30.0 to 30.9 in adult Comments: HEr BMI is acceptable for her demographic. Encouraged to aim for at least 150 minutes of exercise per week.     Patient was given opportunity to ask questions. Patient verbalized understanding of the plan and was able to repeat key elements of the plan. All questions were answered to their satisfaction.  Sheila Greenland, MD   I, Sheila Greenland, MD, have reviewed all documentation for this visit. The documentation on 12/04/19 for the exam, diagnosis, procedures, and orders are all  accurate and complete.  THE PATIENT IS ENCOURAGED TO PRACTICE SOCIAL DISTANCING DUE TO THE COVID-19 PANDEMIC.

## 2019-11-29 ENCOUNTER — Ambulatory Visit: Payer: Self-pay

## 2019-11-29 ENCOUNTER — Telehealth: Payer: Medicare Other

## 2019-11-29 DIAGNOSIS — R002 Palpitations: Secondary | ICD-10-CM

## 2019-11-29 DIAGNOSIS — E1165 Type 2 diabetes mellitus with hyperglycemia: Secondary | ICD-10-CM

## 2019-11-29 DIAGNOSIS — E559 Vitamin D deficiency, unspecified: Secondary | ICD-10-CM

## 2019-11-29 DIAGNOSIS — I890 Lymphedema, not elsewhere classified: Secondary | ICD-10-CM

## 2019-11-29 DIAGNOSIS — I10 Essential (primary) hypertension: Secondary | ICD-10-CM

## 2019-11-29 DIAGNOSIS — M797 Fibromyalgia: Secondary | ICD-10-CM

## 2019-11-29 LAB — TSH: TSH: 1.13 u[IU]/mL (ref 0.450–4.500)

## 2019-11-29 LAB — BMP8+EGFR
BUN/Creatinine Ratio: 13 (ref 12–28)
BUN: 11 mg/dL (ref 8–27)
CO2: 26 mmol/L (ref 20–29)
Calcium: 9.3 mg/dL (ref 8.7–10.3)
Chloride: 104 mmol/L (ref 96–106)
Creatinine, Ser: 0.84 mg/dL (ref 0.57–1.00)
GFR calc Af Amer: 80 mL/min/{1.73_m2} (ref >59)
GFR calc non Af Amer: 70 mL/min/{1.73_m2} (ref >59)
Glucose: 108 mg/dL — ABNORMAL HIGH (ref 65–99)
Potassium: 5 mmol/L (ref 3.5–5.2)
Sodium: 142 mmol/L (ref 134–144)

## 2019-11-29 LAB — BRAIN NATRIURETIC PEPTIDE: BNP: 19.2 pg/mL (ref 0.0–100.0)

## 2019-11-29 LAB — HEMOGLOBIN A1C
Est. average glucose Bld gHb Est-mCnc: 134 mg/dL
Hgb A1c MFr Bld: 6.3 % — ABNORMAL HIGH (ref 4.8–5.6)

## 2019-11-29 LAB — MAGNESIUM: Magnesium: 1.7 mg/dL (ref 1.6–2.3)

## 2019-12-01 NOTE — Chronic Care Management (AMB) (Signed)
Chronic Care Management   Follow Up Note   11/29/2019 Name: Sheila Reynolds MRN: [id number] DOB: 09-28-1945  Referred by: Glendale Chard, MD Reason for referral : Chronic Care Management (CCM RNCM FU Call )   Sheila Reynolds is a 74 y.o. year old female who is a primary care patient of Glendale Chard, MD. The CCM team was consulted for assistance with chronic disease management and care coordination needs.    Review of patient status, including review of consultants reports, relevant laboratory and other test results, and collaboration with appropriate care team members and the patient's provider was performed as part of comprehensive patient evaluation and provision of chronic care management services.    SDOH (Social Determinants of Health) assessments performed: Yes - no acute challenges identified at this time See Care Plan activities for detailed interventions related to [place])   Placed outbound CCM RN CM follow up call to patient for a care plan update.     Outpatient Encounter Medications as of 11/29/2019  Medication Sig  . acetaminophen (TYLENOL) 500 MG tablet Take 1,000 mg by mouth 2 (two) times daily as needed for moderate pain or headache.  . albuterol (PROVENTIL) (2.5 MG/3ML) 0.083% nebulizer solution Take 3 mLs (2.5 mg total) by nebulization daily as needed for wheezing or shortness of breath.  [name] albuterol (VENTOLIN HFA) 108 (90 Base) MCG/ACT inhaler Inhale 2 puffs into the lungs every 6 (six) hours as needed for wheezing or shortness of breath.  [name] atorvastatin (LIPITOR) 80 MG tablet TAKE 1 TABLET(80 MG) BY MOUTH DAILY  . brimonidine (ALPHAGAN P) 0.1 % SOLN Place 1 drop into both eyes 2 (two) times daily.   . brinzolamide (AZOPT) 1 % ophthalmic suspension Place 1 drop into both eyes 3 (three) times daily.   . Carboxymethylcellul-Glycerin (REFRESH OPTIVE OP) Apply to eye.  . carvedilol (COREG) 12.5 MG tablet TAKE 1 TABLET(12.5 MG) BY MOUTH TWICE DAILY  . clopidogrel  (PLAVIX) 75 MG tablet Take 1 tablet (75 mg total) by mouth daily.  . isosorbide mononitrate (IMDUR) 60 MG 24 hr tablet Take 1 tablet (60 mg total) by mouth daily.  [name] JANUMET 50-500 MG tablet TAKE 1 TABLET BY MOUTH TWICE DAILY WITH A MEAL  . Lancets (ONETOUCH DELICA PLUS [id number]) MISC CHECK BLOOD SUGAR BEFORE BREAKFAST AND DINNER  . montelukast (SINGULAIR) 10 MG tablet TAKE 1 TABLET(10 MG) BY MOUTH EVERY EVENING  . Nebulizers (COMPRESSOR/NEBULIZER) MISC Use as directed  . pantoprazole (PROTONIX) 40 MG tablet TAKE 1 TABLET(40 MG) BY MOUTH DAILY  . potassium chloride SA (KLOR-CON) 20 MEQ tablet Take 1 tablet (20 mEq total) by mouth every other day. (Patient taking differently: Take 20 mEq by mouth every other day. Only when taking lasix)  . prednisoLONE acetate (PRED FORTE) 1 % ophthalmic suspension Place 1 drop into both eyes 4 (four) times daily.   . pregabalin (LYRICA) 75 MG capsule TAKE 1 CAPSULE(75 MG) BY MOUTH TWICE DAILY  . travoprost, benzalkonium, (TRAVATAN) 0.004 % ophthalmic solution Place 1 drop into the right eye at bedtime.   . furosemide (LASIX) 40 MG tablet Take 1 tablet (40 mg total) by mouth every other day. (Patient taking differently: Take 40 mg by mouth every other day. 1 every 3rd day)  . nitroGLYCERIN (NITROSTAT) 0.4 MG SL tablet Place 1 tablet (0.4 mg total) under the tongue every 5 (five) minutes as needed for chest pain. (Patient not taking: Reported on 11/29/2019)  . Probiotic Product (ALIGN) 4 MG CAPS Take 1 capsule (  4 mg total) by mouth daily. (Patient not taking: Reported on 11/29/2019)  . [DISCONTINUED] olmesartan (BENICAR) 20 MG tablet Take 20 mg by mouth daily. (Patient not taking: Reported on 11/29/2019)   No facility-administered encounter medications on file as of 11/29/2019.     Objective:  Lab Results  Component Value Date   HGBA1C 6.3 (H) 11/28/2019   HGBA1C 6.2 (H) 08/02/2019   HGBA1C 6.2 (H) 03/23/2019   Lab Results  Component Value Date    MICROALBUR 10 03/23/2019   LDLCALC 73 03/23/2019   CREATININE 0.84 11/28/2019   BP Readings from Last 3 Encounters:  11/28/19 130/68  08/09/19 (!) 152/92  07/25/19 116/78    Goals Addressed      Patient Stated   .  "to get the lymphedema under control" (pt-stated)   On track     [place] (see longitudinal plan of care for additional care plan information)  Current Barriers:  [name] Knowledge Deficits related to disease process and Self Health management of Lymphedema  . Chronic Disease Management support and education needs related to Uncontrolled type two DM with hyperglycemia, Vitamin D deficiency,  Fibromyalgia, Hypertension, Peripheral Vascular disease  Nurse Case Manager Clinical Goal(s):  [name] Over the next 90 days, patient will work with the [facility]ate and PCP to address needs related to disease education and support for improved Self Health management of Lymphedema  CCM RN CM Interventions:  11/29/19 call completed with patient . Inter-disciplinary care team collaboration (see longitudinal plan of care) . Evaluation of current treatment plan related to lymphedema and patient's adherence to plan as established by provider . Determined Dr. Baird Cancer ordered lymphedema therapy for patient for treatment of bilateral lower extremity lymphedema  . Confirmed patient continues to self-apply and is using the pneumatic pumps for treatment of bilateral lymphedema as directed with good effectiveness  . Discussed plans with patient for ongoing care management follow up and provided patient with direct contact information for care management team  Patient Self Care Activities:  . Self administers medications as prescribed . Attends all scheduled provider appointments . Calls pharmacy for medication refills . Calls provider office for new concerns or questions  Please see past updates related to this goal by clicking on the "Past Updates" button in the selected goal      .  "to have less  heart palpitations" (pt-stated)   Not on track     [place] (see longitudinal plan of care for additional care plan information)  Current Barriers:  [name] Knowledge Deficits related to evaluation and treatment of heart palpitations  . Chronic Disease Management support and education needs related to Uncontrolled type two DM with hyperglycemia, Vitamin D deficiency,  Fibromyalgia, Hypertension, Peripheral Vascular disease, Lymphedema of both lower extremities  Nurse Case Manager Clinical Goal(s):  [name] Over the next 180 days, patient will work with the CCM team and PCP to address needs related to disease education and support for evaluation, treatment and Self Health management of heart palpitations  CCM RN CM Interventions:  11/29/19 call completed with patient  . Inter-disciplinary care team collaboration (see longitudinal plan of care) . Evaluation of current treatment plan related to heart palpitations and patient's adherence to plan as established by provider . Provided education to patient re: disease process and treatment recommendations for cardiac arrhythmia's . Reviewed medications with patient and discussed patient is taking her prescribed medications for this condition prescribed by Cardiologist Dr. Acie Fredrickson; Educated patient on the indication, dosing and  frequency of these recommendations . Determined patient does not feel satisfied with how this condition is being managed, she has followed all MD recommendations and holter monitor was negative, she has next f/u appointment with Dr. Acie Fredrickson on 12/15/19 @2 :00 PM  . Encouraged patient to discuss her concerns and ongoing c/o heart palpitations; Educated patient on the possibility of working with a Pharm D along with Cardiology if medication changes are recommended by Dr. Acie Fredrickson . Discussed plans with patient for ongoing care management follow up and provided patient with direct contact information for care management team  Patient Self Care  Activities:  . Self administers medications as prescribed . Attends all scheduled provider appointments . Calls pharmacy for medication refills . Calls provider office for new concerns or questions  Initial goal documentation       Other   .  "I would like to keep my diabetes under control"   On track     [place] (see longtitudinal plan of care for additional care plan information)  Current Barriers:  [name] Knowledge Deficits related to disease process and Self Health management of diabetes . Chronic Disease Management support and education needs related to Uncontrolled type two DM with hyperglycemia, Vitamin D deficiency, Fibromyalgia, Hypertension, Peripheral Vascular disease  Nurse Case Manager Clinical Goal(s):  . 09/22/19 New Over the next 180 days, patient will work with CCM RN CM and PCP  to address needs related to disease education and support to improve Self Health management of DM   CCM RN CM Interventions:  11/29/19 call completed with patient  . Evaluation of current treatment plan related to Diabetes and patient's adherence to plan as established by provider . Reinforced education to patient re: A1c remains to be 6.3%; Re-Educated on how to maintain and control DM with adherence to exercise, diet and taking medications exactly as prescribed; Re-Educated on daily glycemic control, FBS 80-130, <180 after meals; patient feels satisfied with her current DM treatment plan   . Reviewed medications with patient and discussed indication, dosage and frequency of Janumet 50-500 mg bid with meals  . Provided patient with printed educational materials related to Diabetes Management using Meal Planning  . Advised patient, providing education and rationale, to check cbg daily before meals and record, calling CCM RN CM and or PCP for findings outside established parameters  . Discussed plans with patient for ongoing care management follow up and provided patient with direct contact  information for care management team  Patient Self Care Activities:  . Self administers medications as prescribed . Attends all scheduled provider appointments . Calls pharmacy for medication refills . Performs ADL's independently . Performs IADL's independently . Calls provider office for new concerns or questions  Please see past updates related to this goal by clicking on the "Past Updates" button in the selected goal      .  improve adherence with taking Vitamin d   Not on track     [place] (see longitudinal plan of care for additional care plan information)  Current Barriers:  [name] Knowledge Deficits related to disease process and Self Health management of Vitamin D deficiency . Chronic Disease Management support and education needs related to Uncontrolled type two DM with hyperglycemia, Vitamin D deficiency,  Fibromyalgia, Hypertension, Peripheral Vascular disease, Lymphedema of both lower extremities  Nurse Case Manager Clinical Goal(s):  [name] Over the next 60 days, patient will work with the CCM team and PCP to address needs related to disease education  and support for improved Self Health management of Vitamin d deficiency with improved adherence to taking Vitamin D as prescribed   CCM RN CM Interventions:  11/29/19 call completed with patient  . Inter-disciplinary care team collaboration (see longitudinal plan of care) . Evaluation of current treatment plan related to Vitamin d deficiency and patient's adherence to plan as established by provider . Noted patient's last Vitamin D level was checked on 10/07/18 with a range of 34.9; noted per Dr.Sanders following this result, she recommended patient start a Vitamin D supplement in order to raise this level for optimal health; Determined patient is not taking Vitamin D after completing med recon with patient . Discussed plans with patient for ongoing care management follow up and provided patient with direct contact information  for care management team  Patient Self Care Activities:  . Self administers medications as prescribed . Attends all scheduled provider appointments . Calls pharmacy for medication refills . Calls provider office for new concerns or questions  Initial goal documentation       Plan:   Telephone follow up appointment with care management team member scheduled for: 01/11/20  Barb Merino, RN, BSN, CCM Care Management Coordinator [facility]  Direct Phone: [phone]

## 2019-12-01 NOTE — Patient Instructions (Signed)
Visit Information  Goals Addressed      Patient Stated   .  "to get the lymphedema under control" (pt-stated)   On track     [place] (see longitudinal plan of care for additional care plan information)  Current Barriers:  [name] Knowledge Deficits related to disease process and Self Health management of Lymphedema  . Chronic Disease Management support and education needs related to Uncontrolled type two DM with hyperglycemia, Vitamin D deficiency,  Fibromyalgia, Hypertension, Peripheral Vascular disease  Nurse Case Manager Clinical Goal(s):  [name] Over the next 90 days, patient will work with the [place] and PCP to address needs related to disease education and support for improved Self Health management of Lymphedema  CCM RN CM Interventions:  11/29/19 call completed with patient . Inter-disciplinary care team collaboration (see longitudinal plan of care) . Evaluation of current treatment plan related to lymphedema and patient's adherence to plan as established by provider . Determined Dr. Baird Cancer ordered lymphedema therapy for patient for treatment of bilateral lower extremity lymphedema  . Confirmed patient continues to self-apply and is using the pneumatic pumps for treatment of bilateral lymphedema as directed with good effectiveness  . Discussed plans with patient for ongoing care management follow up and provided patient with direct contact information for care management team  Patient Self Care Activities:  . Self administers medications as prescribed . Attends all scheduled provider appointments . Calls pharmacy for medication refills . Calls provider office for new concerns or questions  Please see past updates related to this goal by clicking on the "Past Updates" button in the selected goal      .  "to have less heart palpitations" (pt-stated)   Not on track     [place] (see longitudinal plan of care for additional care plan information)  Current Barriers:   [name] Knowledge Deficits related to evaluation and treatment of heart palpitations  . Chronic Disease Management support and education needs related to Uncontrolled type two DM with hyperglycemia, Vitamin D deficiency,  Fibromyalgia, Hypertension, Peripheral Vascular disease, Lymphedema of both lower extremities  Nurse Case Manager Clinical Goal(s):  [name] Over the next 180 days, patient will work with the CCM team and PCP to address needs related to disease education and support for evaluation, treatment and Self Health management of heart palpitations  CCM RN CM Interventions:  11/29/19 call completed with patient  . Inter-disciplinary care team collaboration (see longitudinal plan of care) . Evaluation of current treatment plan related to heart palpitations and patient's adherence to plan as established by provider . Provided education to patient re: disease process and treatment recommendations for cardiac arrhythmia's . Reviewed medications with patient and discussed patient is taking her prescribed medications for this condition prescribed by Cardiologist Dr. Acie Fredrickson; Educated patient on the indication, dosing and frequency of these recommendations . Determined patient does not feel satisfied with how this condition is being managed, she has followed all MD recommendations and holter monitor was negative, she has next f/u appointment with Dr. Acie Fredrickson on 12/15/19 @2 :00 PM  . Encouraged patient to discuss her concerns and ongoing c/o heart palpitations; Educated patient on the possibility of working with a Pharm D along with Cardiology if medication changes are recommended by Dr. Acie Fredrickson . Discussed plans with patient for ongoing care management follow up and provided patient with direct contact information for care management team  Patient Self Care Activities:  . Self administers medications as prescribed . Attends all scheduled provider  appointments . Calls pharmacy for medication refills . Calls  provider office for new concerns or questions  Initial goal documentation       Other   .  "I would like to keep my diabetes under control"   On track     [place] (see longtitudinal plan of care for additional care plan information)  Current Barriers:  [name] Knowledge Deficits related to disease process and Self Health management of diabetes . Chronic Disease Management support and education needs related to Uncontrolled type two DM with hyperglycemia, Vitamin D deficiency, Fibromyalgia, Hypertension, Peripheral Vascular disease  Nurse Case Manager Clinical Goal(s):  . 09/22/19 New Over the next 180 days, patient will work with CCM RN CM and PCP  to address needs related to disease education and support to improve Self Health management of DM   CCM RN CM Interventions:  11/29/19 call completed with patient  . Evaluation of current treatment plan related to Diabetes and patient's adherence to plan as established by provider . Reinforced education to patient re: A1c remains to be 6.3%; Re-Educated on how to maintain and control DM with adherence to exercise, diet and taking medications exactly as prescribed; Re-Educated on daily glycemic control, FBS 80-130, <180 after meals; patient feels satisfied with her current DM treatment plan   . Reviewed medications with patient and discussed indication, dosage and frequency of Janumet 50-500 mg bid with meals  . Provided patient with printed educational materials related to Diabetes Management using Meal Planning  . Advised patient, providing education and rationale, to check cbg daily before meals and record, calling CCM RN CM and or PCP for findings outside established parameters  . Discussed plans with patient for ongoing care management follow up and provided patient with direct contact information for care management team  Patient Self Care Activities:  . Self administers medications as prescribed . Attends all scheduled provider  appointments . Calls pharmacy for medication refills . Performs ADL's independently . Performs IADL's independently . Calls provider office for new concerns or questions  Please see past updates related to this goal by clicking on the "Past Updates" button in the selected goal      .  improve adherence with taking Vitamin d   Not on track     [place] (see longitudinal plan of care for additional care plan information)  Current Barriers:  [name] Knowledge Deficits related to disease process and Self Health management of Vitamin D deficiency . Chronic Disease Management support and education needs related to Uncontrolled type two DM with hyperglycemia, Vitamin D deficiency,  Fibromyalgia, Hypertension, Peripheral Vascular disease, Lymphedema of both lower extremities  Nurse Case Manager Clinical Goal(s):  [name] Over the next 60 days, patient will work with the CCM team and PCP to address needs related to disease education and support for improved Self Health management of Vitamin d deficiency with improved adherence to taking Vitamin D as prescribed   CCM RN CM Interventions:  11/29/19 call completed with patient  . Inter-disciplinary care team collaboration (see longitudinal plan of care) . Evaluation of current treatment plan related to Vitamin d deficiency and patient's adherence to plan as established by provider . Noted patient's last Vitamin D level was checked on 10/07/18 with a range of 34.9; noted per Dr.Sanders following this result, she recommended patient start a Vitamin D supplement in order to raise this level for optimal health; Determined patient is not taking Vitamin D after completing med recon with patient .  Discussed plans with patient for ongoing care management follow up and provided patient with direct contact information for care management team  Patient Self Care Activities:  . Self administers medications as prescribed . Attends all scheduled provider  appointments . Calls pharmacy for medication refills . Calls provider office for new concerns or questions  Initial goal documentation       Patient verbalizes understanding of instructions provided today.   Telephone follow up appointment with care management team member scheduled for: 01/11/20  Barb Merino, RN, BSN, CCM Care Management Coordinator [facility]  Direct Phone: [phone]

## 2019-12-07 ENCOUNTER — Other Ambulatory Visit: Payer: Self-pay

## 2019-12-07 ENCOUNTER — Other Ambulatory Visit: Payer: Self-pay | Admitting: Gastroenterology

## 2019-12-15 ENCOUNTER — Ambulatory Visit (INDEPENDENT_AMBULATORY_CARE_PROVIDER_SITE_OTHER): Payer: Medicare Other | Admitting: Cardiovascular Disease

## 2019-12-15 ENCOUNTER — Other Ambulatory Visit: Payer: Self-pay

## 2019-12-15 ENCOUNTER — Encounter: Payer: Self-pay | Admitting: Nurse Practitioner

## 2019-12-15 ENCOUNTER — Encounter: Payer: Self-pay | Admitting: Cardiovascular Disease

## 2019-12-15 VITALS — BP 140/68 | HR 71 | Ht 62.0 in | Wt 178.4 lb

## 2019-12-15 DIAGNOSIS — R079 Chest pain, unspecified: Secondary | ICD-10-CM | POA: Diagnosis not present

## 2019-12-15 DIAGNOSIS — I25119 Atherosclerotic heart disease of native coronary artery with unspecified angina pectoris: Secondary | ICD-10-CM | POA: Diagnosis not present

## 2019-12-15 DIAGNOSIS — R0600 Dyspnea, unspecified: Secondary | ICD-10-CM | POA: Diagnosis not present

## 2019-12-15 DIAGNOSIS — R002 Palpitations: Secondary | ICD-10-CM | POA: Diagnosis not present

## 2019-12-15 DIAGNOSIS — R0609 Other forms of dyspnea: Secondary | ICD-10-CM

## 2019-12-15 NOTE — Progress Notes (Signed)
Cardiology Office Note   Date:  12/15/2019   ID:  Sheila Reynolds, Sheila Reynolds [birth year], MRN [id number]  PCP:  Glendale Chard, MD  Cardiologist:   Mertie Moores, MD   No chief complaint on file.  1. Hyperlipidemia 2.  CAD -  orbital atherectomy and PCI using Xience Sierra 3.5 x 33 mm DES to prox / mid LAD  3. Nonsustained ventricular tachycardia 4. Mild to moderate coronary artery disease by cath in 2009. 5. 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 Keturah Barre 6.  Venous insufficiency     Sheila Reynolds is a 74 y.o. female with the above noted hx. She has continued to have some palpitations. She has occasional episodes of lightheadedness when she has palpitations. Her main issue has been lots of total body cramps. She has been seen in the emergency room. Her lab work looked fine.  She's had lots of dyspnea especially with exertion. She has a history of sarcoidosis and is followed by Dr. Annamaria Boots.  March 11, 2012: Sheila Reynolds has had more palpitations recently. It Appears that her metoprolol dose was decreased slightly. We placed an event monitor on her. She did not have any episodes of atrial fibrillation. She has occasional drinker atrial contractions and occasional premature ventricular contractions. There were no life-threatening arrhythmias.  She is doing well from a cardiac standpoint. She's not had any episodes of syncope. She has significant shortness of breath especially when climbing stairs. This is likely due to her sarcoidosis. She denies any chest pain. Her BP is a bit elevated. She avoids salt.   May 02, 2013 :  She presented to see Richardson Dopp in February with some increased palpitations and lightheadedness. He recommended that she decrease her caffeine intake. He placed a 21 day event monitor and instructed her to take an extra metoprolol as needed.  The palpitations can occur at any time. They occur with rest and with exertion. They're not constant. The last 30-45 seconds and  occur multiple times through the day.  Sept. 16, 2015:  Still having palpitations. She has had more leg edema and her medical doctor on Lasix which he now takes about every other day. She was not on potassium replacement. This may be the cause of her increased PVCs.   May 24, 2014:  Sheila Reynolds is a 74 y.o. female who presents for follow up of her palpitations   Still has these palpitations.  No syncope.   Jan. 10, 2017:  Doing ok Still has DOE  Has palpitations on occasion. Not getting much exercise .  expalined a typical exercise program .   Start slow, gradually increase   May 07, 2016;   Sheila Reynolds is seen today for follow up .  Has been having more palpitations Seem to be getting worse  Went to there ER last week -  the ER note does not describe any specific arrhythmias. No passing out but she did get lightheaded when she had palpitations on one occasion   Has rare episodes of atypical CP .  Lasts a second Not associated with exertion .  Walks 1.5 miles 3-4 times a week .   This walking does not cause any palpitation of CP   July 17, 2016 Sheila Reynolds is seen today for follow up  30 day monitor showed no significant arrhythmias  Having leg pain and swelling  during night and day  Not worsened by exercise  No CP or dyspnea - still walking 1.5 miles 3-4 days a week  December 22, 2016:  Sheila Reynolds had a cath recently that showed a 70 % LAD stenosis . Has right arm pain but this is not exertional . Has pins and needles sensation Has DOE with stairs and carrying something heavy .  Has not really noticed any difference in how she feels on the new meds which include Imdur 30 mg , Plavix 75 ,  + cough, chronic ,  No hemoptysis. Does not exercise regularly .    January 29, 2017: Sheila Reynolds is seen today for a follow-up visit.  She recently had rotational atherectomy and stenting of her proximal LAD and mid LAD. Still has some pins and needles sensation Has been  walking in the mall.  Does not have CP . Has some DOE with that   .  Jul 09, 2017:   Sheila Reynolds is doing well.  She is status post rotational atherectomy and stent placement to her proximal/mid LAD in December, 2018. Had left eye surgery in left eye.    Occasional CP ,  Thinks it might be indigestion .  Not associated with eating or drinking . Pain was on the left side of left breast.   Lasts a few minutes Did not have much pain prior to her PCI to LAD .  Has been exercising .   Exercise does not cause any CP .  Relieved with SL NTG   BP has been elevated.   Aug. 26, 2019: Following her visit in February, she was having some chest pain.  A stress Myoview study was performed and was found to be low risk.  She has normal left ventricular systolic function and no ischemia. She is feeling quite well.  She has not had any recurrent episodes of chest pain. She is exercising some .   Has been having bilateral leg pain and swelling.  She had an arterial Doppler performed for peripheral vascular disease.  As of today the study has not yet been read.  January 29, 2018: Sheila Reynolds is seen today for 2 reasons.  She is been having more shortness of breath than normal.  She also needs preoperative evaluation.  She has a history of coronary artery disease and is status post stenting of her LAD in December 2018. Is having more DOE with any activity , climbing stairs,  Similar to how she felt prior to stenting Resolves after 5- 10 min of rest.  Has not taken a NTG   Also needs to have eyelid  surgery  Scheduled for Dec. 31.   Oct. 27. 2020  Sheila Reynolds is seen today . Hx of CAD - stenting of her LAD in Dec. 2018   Has some dyspnea.  Constant back pain , may last for hours.  Radiates through to her chest .  Has not tried any NTG. Not relieved with alka seltzer.   Has had some R eye vision issues Was found to have had 2 strokes over the past years. 1 was an older CVA and 1 was a more recent CVA    Has seen a neurologist her in [place]. Carotid duplex scan revealed mild plaque bilaterally but no significant stenoses. Transcranial Doppler revealed some diffuse plaque but no critical stenoses.  Jun 16, 2019:  She has had some worsening shortness of breath as well as some leg edema recently.  The left leg swells worse than the right leg. Echo from Dec. 2020 - showed normal LV systolic function with grade I diastolic dysfuncton  Venous duplex was normal  She has  been seeing the podiatrist down in [place] and was advised to have a venogram for further evaluation.  Nov. 4, 2021:  Sheila Reynolds is seen today  She has had palpitations .   Associated with some ches pain.   Gets  dizzy,   palps will last all day long.    Occurs randomly during the day .   Not associated with exercise  Will place an event monitor .     Past Medical History:  Diagnosis Date   Anemia    3 months ago anemic   Anxiety    on meds   Arthritis    "all over" (01/15/2017)   Asthma    Bronchitis with emphysema    Chest pain    Chronic bronchitis ([place])    Coronary artery disease    a. 01/2017 she underwent orbital atherectomy/DES to the proxmal LAD and PTCA to ostial D2. 2D Echo 01/15/17 showed mild LVH, EF 60-65%, grade 1 DD.   Family history of anesthesia complication    daughter N/V   Fibromyalgia    GERD (gastroesophageal reflux disease)    on meds   Glaucoma    Heart murmur    History of hiatal hernia    Hx of echocardiogram    Echo (03/2013):  Tech limited; Mild focal basal septal hypertrophy, EF 60-65%, normal RVF   Hyperlipidemia    Hypertension    Nonsustained ventricular tachycardia (HCC)    OSA on CPAP    Premature atrial contractions    PVC (premature ventricular contraction)    a. Holter 12/16: NSR, occ PAC,PVCs   Sarcoidosis    Type II diabetes mellitus ([place])     Past Surgical History:  Procedure Laterality Date   BREAST SURGERY     CARDIAC  CATHETERIZATION  05/04/2007   reveals overall normal left ventricular systolic function. Ejection fraction 65-70%   CATARACT EXTRACTION W/PHACO Right 07/20/2013   Procedure: CATARACT EXTRACTION PHACO AND INTRAOCULAR LENS PLACEMENT (IOC);  Surgeon: Marylynn Pearson, MD;  Location: [facility];  Service: Ophthalmology;  Laterality: Right;   COLONOSCOPY W/ BIOPSIES AND POLYPECTOMY     COLONOSCOPY WITH PROPOFOL N/A 09/07/2014   Procedure: COLONOSCOPY WITH PROPOFOL;  Surgeon: Juanita Craver, MD;  Location: [facility];  Service: Endoscopy;  Laterality: N/A;   CORONARY ANGIOPLASTY WITH STENT PLACEMENT  01/15/2017   CORONARY ATHERECTOMY N/A 01/15/2017   Procedure: CORONARY ATHERECTOMY;  Surgeon: Nelva Bush, MD;  Location: [facility];  Service: Cardiovascular;  Laterality: N/A;   CORONARY BALLOON ANGIOPLASTY N/A 01/15/2017   Procedure: CORONARY BALLOON ANGIOPLASTY;  Surgeon: Nelva Bush, MD;  Location: [facility];  Service: Cardiovascular;  Laterality: N/A;   CORONARY STENT INTERVENTION N/A 01/15/2017   Procedure: CORONARY STENT INTERVENTION;  Surgeon: Nelva Bush, MD;  Location: [facility];  Service: Cardiovascular;  Laterality: N/A;   ESOPHAGOGASTRODUODENOSCOPY (EGD) WITH PROPOFOL N/A 09/07/2014   Procedure: ESOPHAGOGASTRODUODENOSCOPY (EGD) WITH PROPOFOL;  Surgeon: Juanita Craver, MD;  [facility];  Service: Endoscopy;  Laterality: N/A;   EXTERNAL EAR SURGERY Bilateral 1970s   tumors removed   EYE SURGERY Left 2019   cataract extraction    EYE SURGERY Right 02/09/2018   eyelid surgery    INTRAVASCULAR PRESSURE WIRE/FFR STUDY N/A 12/12/2016   Procedure: INTRAVASCULAR PRESSURE WIRE/FFR STUDY;  Surgeon: Nelva Bush, MD;  Location: [facility];  Service: Cardiovascular;  Laterality: N/A;   MINI SHUNT INSERTION Right 07/20/2013   Procedure: INSERTION OF GLAUCOMA FILTRATION DEVICE RIGHT EYE;  Surgeon: Marylynn Pearson, MD;  Location: [facility];  Service:  Ophthalmology;  Laterality: Right;   MITOMYCIN C APPLICATION Right 6/37/8588   Procedure: MITOMYCIN C APPLICATION;  Surgeon: Marylynn Pearson, MD;  Location: [facility];  Service: Ophthalmology;  Laterality: Right;   MITOMYCIN C APPLICATION Right 06/12/7739   Procedure: MITOMYCIN C APPLICATION RIGHT EYE;  Surgeon: Marylynn Pearson, MD;  Location: [facility];  Service: Ophthalmology;  Laterality: Right;   PLACEMENT OF BREAST IMPLANTS Bilateral 1992   "took all my breast tissue out; put implants in;fibrocystic breast disease "   RIGHT/LEFT HEART CATH AND CORONARY ANGIOGRAPHY N/A 12/12/2016   Procedure: RIGHT/LEFT HEART CATH AND CORONARY ANGIOGRAPHY;  Surgeon: Nelva Bush, MD;  Location: [facility];  Service: Cardiovascular;  Laterality: N/A;   TONSILLECTOMY     TOTAL ABDOMINAL HYSTERECTOMY  1982   TRABECULECTOMY Right 02/21/2015   Procedure: TRABECULECTOMY WITH [facility] ON THE RIGHT EYE;  Surgeon: Marylynn Pearson, MD;  Location: [facility];  Service: Ophthalmology;  Laterality: Right;     Current Outpatient Medications  Medication Sig Dispense Refill   acetaminophen (TYLENOL) 500 MG tablet Take 1,000 mg by mouth 2 (two) times daily as needed for moderate pain or headache.     albuterol (PROVENTIL) (2.5 MG/3ML) 0.083% nebulizer solution Take 3 mLs (2.5 mg total) by nebulization daily as needed for wheezing or shortness of breath. 75 mL 12   albuterol (VENTOLIN HFA) 108 (90 Base) MCG/ACT inhaler Inhale 2 puffs into the lungs every 6 (six) hours as needed for wheezing or shortness of breath. 1 Inhaler 12   atorvastatin (LIPITOR) 80 MG tablet TAKE 1 TABLET(80 MG) BY MOUTH DAILY 90 tablet 2   brimonidine (ALPHAGAN P) 0.1 % SOLN Place 1 drop into both eyes 2 (two) times daily.      brinzolamide (AZOPT) 1 % ophthalmic suspension Place 1 drop into both eyes 3 (three) times daily.      Carboxymethylcellul-Glycerin (REFRESH OPTIVE OP) Apply to eye.     carvedilol (COREG) 12.5 MG tablet TAKE 1 TABLET(12.5 MG)  BY MOUTH TWICE DAILY 180 tablet 3   clopidogrel (PLAVIX) 75 MG tablet Take 1 tablet (75 mg total) by mouth daily. 90 tablet 3   furosemide (LASIX) 40 MG tablet Take 1 tablet (40 mg total) by mouth every other day. 90 tablet 1   isosorbide mononitrate (IMDUR) 60 MG 24 hr tablet Take 1 tablet (60 mg total) by mouth daily. 90 tablet 3   JANUMET 50-500 MG tablet TAKE 1 TABLET BY MOUTH TWICE DAILY WITH A MEAL 180 tablet 1   Lancets (ONETOUCH DELICA PLUS OINOMV67M) MISC CHECK BLOOD SUGAR BEFORE BREAKFAST AND DINNER 100 each 1   montelukast (SINGULAIR) 10 MG tablet TAKE 1 TABLET(10 MG) BY MOUTH EVERY EVENING 90 tablet 1   Nebulizers (COMPRESSOR/NEBULIZER) MISC Use as directed 1 each 0   nitroGLYCERIN (NITROSTAT) 0.4 MG SL tablet Place 1 tablet (0.4 mg total) under the tongue every 5 (five) minutes as needed for chest pain. 25 tablet prn   pantoprazole (PROTONIX) 40 MG tablet TAKE 1 TABLET(40 MG) BY MOUTH DAILY 30 tablet 11   potassium chloride SA (KLOR-CON) 20 MEQ tablet Take 1 tablet (20 mEq total) by mouth every other day. 90 tablet 1   prednisoLONE acetate (PRED FORTE) 1 % ophthalmic suspension Place 1 drop into both eyes 4 (four) times daily.      pregabalin (LYRICA) 75 MG capsule TAKE 1 CAPSULE(75 MG) BY MOUTH TWICE DAILY 180 capsule 1   travoprost,  benzalkonium, (TRAVATAN) 0.004 % ophthalmic solution Place 1 drop into the right eye at bedtime.      No current facility-administered medications for this visit.    Allergies:   Crestor [rosuvastatin calcium], Demerol  [meperidine hcl], Shellfish allergy, and Demerol [meperidine]    Social History:  The patient  reports that she quit smoking about 39 years ago. Her smoking use included cigarettes. She has a 4.00 pack-year smoking history. She has never used smokeless tobacco. She reports that she does not drink alcohol and does not use drugs.   Family History:  The patient's family history includes Cancer in her mother and paternal  grandmother; Diabetes in her brother and father; Glaucoma in her brother and father; Heart disease in her father and paternal grandmother.    ROS: Noted in current history, otherwise review of systems is negative.  Physical Exam: Blood pressure 140/68, pulse 71, height 5\' 2"  (1.575 m), weight 178 lb 6.4 oz (80.9 kg), SpO2 98 %.  GEN:  Well nourished, well developed in no acute distress HEENT: Normal NECK: No JVD; No carotid bruits LYMPHATICS: No lymphadenopathy CARDIAC: RRR , no murmurs, rubs, gallops RESPIRATORY:  Clear to auscultation without rales, wheezing or rhonchi  ABDOMEN: Soft, non-tender, non-distended MUSCULOSKELETAL:  1 + Left leg edema  SKIN: Warm and dry NEUROLOGIC:  Alert and oriented x 3   EKG:    Recent Labs: 03/23/2019: ALT 17 08/02/2019: Hemoglobin 11.4; Platelets 223 11/28/2019: BNP 19.2; BUN 11; Creatinine, Ser 0.84; Magnesium 1.7; Potassium 5.0; Sodium 142; TSH 1.130    Lipid Panel    Component Value Date/Time   CHOL 133 03/23/2019 1141   TRIG 102 03/23/2019 1141   HDL 41 03/23/2019 1141   CHOLHDL 3.2 03/23/2019 1141   CHOLHDL 3 05/24/2014 1650   VLDL 20.8 05/24/2014 1650   LDLCALC 73 03/23/2019 1141      Wt Readings from Last 3 Encounters:  12/15/19 178 lb 6.4 oz (80.9 kg)  11/28/19 175 lb 12.8 oz (79.7 kg)  08/22/19 175 lb 12.8 oz (79.7 kg)      Other studies Reviewed: Additional studies/ records that were reviewed today include: . Review of the above records demonstrates:    ASSESSMENT AND PLAN:   1.   Coronary artery disease:    Continues to have some chest tightness.  Somewhat atypical for angiina but her previous angina symptoms were also atypical .    Will get a lexiscan myoview    2. Hyperlipidemia -  Labs are stable   4. Nonsustained ventricular tachycardia-    She has had palpitations.  Worrisome for nonsustaind VT.   Will place an event monitor.  5.  Sarcoidosis -   6.  Leg edema :      Current medicines are reviewed  at length with the patient today.  The patient does not have concerns regarding medicines.  The following changes have been made:  no change  Labs/ tests ordered today include:   No orders of the defined types were placed in this encounter.    Disposition:     Signed, Mertie Moores, MD  12/15/2019 2:17 PM    [facility] [address], [place], [place]  [address] Phone: [phone]; Fax: [phone]

## 2019-12-15 NOTE — Patient Instructions (Addendum)
Medication Instructions:  Your physician recommends that you continue on your current medications as directed. Please refer to the Current Medication list given to you today.  *If you need a refill on your cardiac medications before your next appointment, please call your pharmacy*   Lab Work: None Ordered If you have labs (blood work) drawn today and your tests are completely normal, you will receive your results only by: [name] MyChart Message (if you have MyChart) OR . A paper copy in the mail If you have any lab test that is abnormal or we need to change your treatment, we will call you to review the results.   Testing/Procedures: Your physician has requested that you have a lexiscan myoview. For further information please visit [email or web address]. Please follow instruction sheet, as given.  ZIO XT- Long Term Monitor Instructions   Your physician has requested you wear your ZIO patch monitor_______days.   This is a single patch monitor.  [employer] supplies one patch monitor per enrollment.  Additional stickers are not available.   Please do not apply patch if you will be having a Nuclear Stress Test, Echocardiogram, Cardiac CT, MRI, or Chest Xray during the time frame you would be wearing the monitor. The patch cannot be worn during these tests.  You cannot remove and re-apply the ZIO XT patch monitor.   Your ZIO patch monitor will be sent USPS Priority mail from [facility] directly to your home address. The monitor may also be mailed to a PO BOX if home delivery is not available.   It may take 3-5 days to receive your monitor after you have been enrolled.   Once you have received you monitor, please review enclosed instructions.  Your monitor has already been registered assigning a specific monitor serial # to you.   Applying the monitor   Shave hair from upper left chest.   Hold abrader disc by orange tab.  Rub abrader in 40 strokes over left upper chest as indicated in  your monitor instructions.   Clean area with 4 enclosed alcohol pads .  Use all pads to assure are is cleaned thoroughly.  Let dry.   Apply patch as indicated in monitor instructions.  Patch will be place under collarbone on left side of chest with arrow pointing upward.   Rub patch adhesive wings for 2 minutes.Remove white label marked "1".  Remove white label marked "2".  Rub patch adhesive wings for 2 additional minutes.   While looking in a mirror, press and release button in center of patch.  A small green light will flash 3-4 times .  This will be your only indicator the monitor has been turned on.     Do not shower for the first 24 hours.  You may shower after the first 24 hours.   Press button if you feel a symptom. You will hear a small click.  Record Date, Time and Symptom in the Patient Log Book.   When you are ready to remove patch, follow instructions on last 2 pages of Patient Log Book.  Stick patch monitor onto last page of Patient Log Book.   Place Patient Log Book in [place] box.  Use locking tab on box and tape box closed securely.  The [employer] has [employer] on it.  Please place in mailbox as soon as possible.  Your physician should have your test results approximately 7 days after the monitor has been mailed back to [facility].   Call [employer] Customer Care at [phone] if you have questions regarding your ZIO XT patch monitor.  Call them immediately if you see an orange light blinking on your monitor.   If your monitor falls off in less than 4 days contact our Monitor department at [phone].  If your monitor becomes loose or falls off after 4 days call [employer] at [phone] for suggestions on securing your monitor.     Follow-Up: At [facility], you and your health needs are our priority.  As part of our continuing mission to provide you with exceptional heart care, we have created designated Provider Care Teams.  These Care Teams  include your primary Cardiologist (physician) and Advanced Practice Providers (APPs -  Physician Assistants and Nurse Practitioners) who all work together to provide you with the care you need, when you need it.   Your next appointment:   3 month(s)  The format for your next appointment:   In Person  Provider:   You will see one of the following Advanced Practice Providers on your designated Care Team:    Richardson Dopp, PA-C  Vin Parker, [place]     For your  leg edema you  should do  the following 1. Leg elevation - I recommend the Lounge Dr. Leg rest.  See below for details  2. Salt restriction  -  Use potassium chloride instead of regular salt as a salt substitute. 3. Walk regularly 4. Compression hose - [employer] 5. Weight loss    Available on [email or web address] Or  Go to [email or web address]

## 2019-12-19 ENCOUNTER — Other Ambulatory Visit: Payer: Self-pay | Admitting: Cardiovascular Disease

## 2019-12-19 ENCOUNTER — Telehealth (HOSPITAL_COMMUNITY): Payer: Self-pay

## 2019-12-19 NOTE — Telephone Encounter (Signed)
Attempted to contact the patient, her answering machine wasn't set up. Will try again later. S.Jozelynn Danielson EMTP

## 2019-12-20 ENCOUNTER — Other Ambulatory Visit: Payer: Self-pay

## 2019-12-20 ENCOUNTER — Encounter (HOSPITAL_COMMUNITY): Payer: Medicare Other

## 2019-12-20 ENCOUNTER — Ambulatory Visit (HOSPITAL_COMMUNITY): Payer: Medicare Other | Attending: Cardiology

## 2019-12-20 VITALS — Ht 62.0 in | Wt 178.0 lb

## 2019-12-20 DIAGNOSIS — R079 Chest pain, unspecified: Secondary | ICD-10-CM

## 2019-12-20 DIAGNOSIS — R110 Nausea: Secondary | ICD-10-CM | POA: Insufficient documentation

## 2019-12-20 LAB — MYOCARDIAL PERFUSION IMAGING
LV dias vol: 51 mL (ref 46–106)
LV sys vol: 17 mL
Peak HR: 114 {beats}/min
Rest HR: 74 {beats}/min
SDS: 2
SRS: 2
SSS: 4
TID: 0.96

## 2019-12-20 MED ORDER — REGADENOSON 0.4 MG/5ML IV SOLN
0.4000 mg | Freq: Once | INTRAVENOUS | Status: AC
  Administered 2019-12-20: 0.4 mg via INTRAVENOUS

## 2019-12-20 MED ORDER — TECHNETIUM TC 99M TETROFOSMIN IV KIT
30.7000 | PACK | Freq: Once | INTRAVENOUS | Status: AC | PRN
  Administered 2019-12-20: 30.7 via INTRAVENOUS
  Filled 2019-12-20: qty 31

## 2019-12-20 MED ORDER — AMINOPHYLLINE 25 MG/ML IV SOLN
75.0000 mg | Freq: Once | INTRAVENOUS | Status: AC
  Administered 2019-12-20: 75 mg via INTRAVENOUS

## 2019-12-20 MED ORDER — TECHNETIUM TC 99M TETROFOSMIN IV KIT
10.4000 | PACK | Freq: Once | INTRAVENOUS | Status: AC | PRN
  Administered 2019-12-20: 10.4 via INTRAVENOUS
  Filled 2019-12-20: qty 11

## 2019-12-27 ENCOUNTER — Ambulatory Visit (INDEPENDENT_AMBULATORY_CARE_PROVIDER_SITE_OTHER): Payer: Medicare Other

## 2019-12-27 DIAGNOSIS — R0600 Dyspnea, unspecified: Secondary | ICD-10-CM | POA: Diagnosis not present

## 2019-12-27 DIAGNOSIS — R002 Palpitations: Secondary | ICD-10-CM

## 2019-12-27 DIAGNOSIS — R079 Chest pain, unspecified: Secondary | ICD-10-CM

## 2019-12-27 DIAGNOSIS — R0609 Other forms of dyspnea: Secondary | ICD-10-CM

## 2019-12-28 ENCOUNTER — Encounter: Payer: Self-pay | Admitting: Internal Medicine

## 2019-12-29 IMAGING — DX DG CHEST 2V
2 series · 2 of 2 positions shown · non-contrast
Comparison: 12/30/2018

CLINICAL DATA: Chest pain for several hours

EXAM:
CHEST - 2 VIEW

[chest pa]
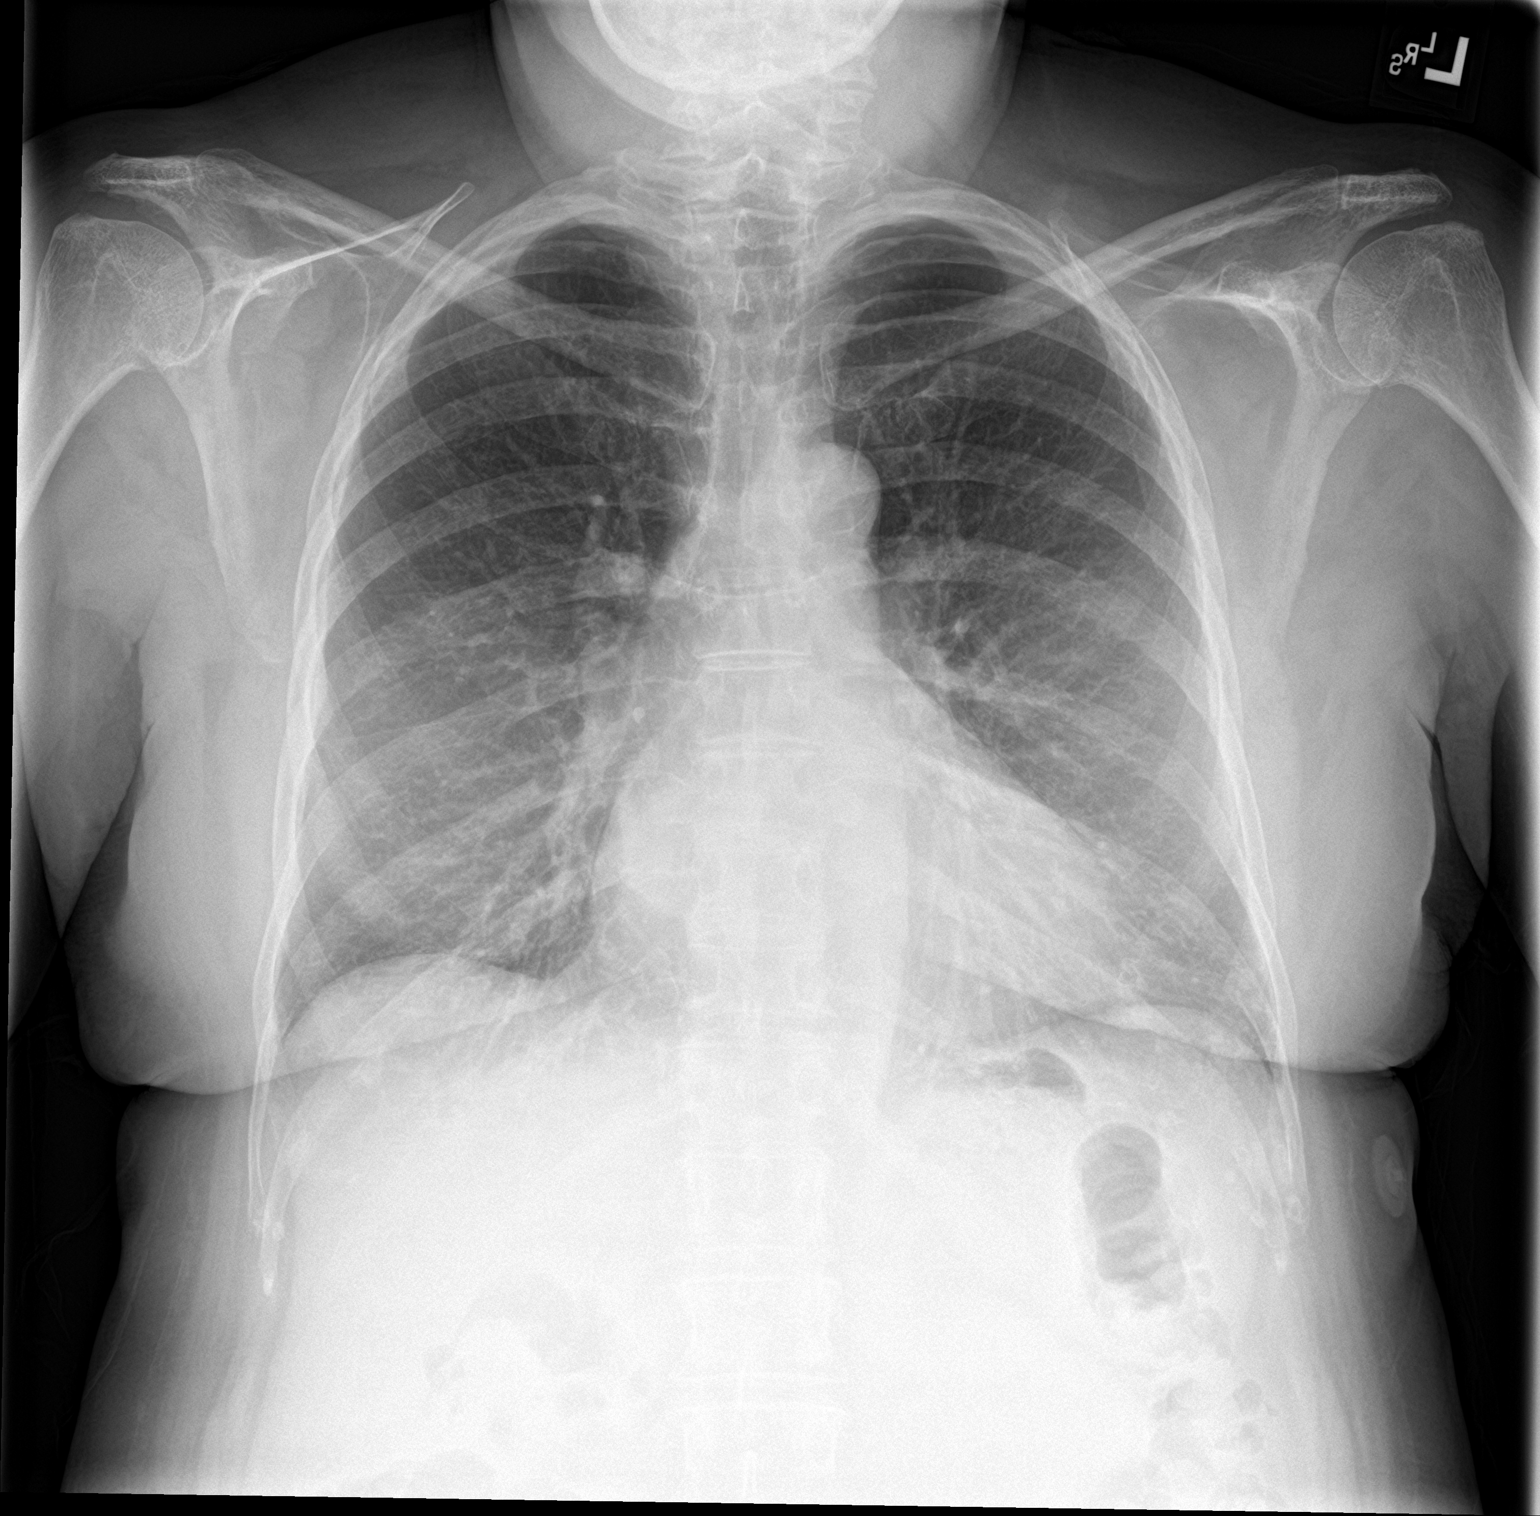

[chest lat]
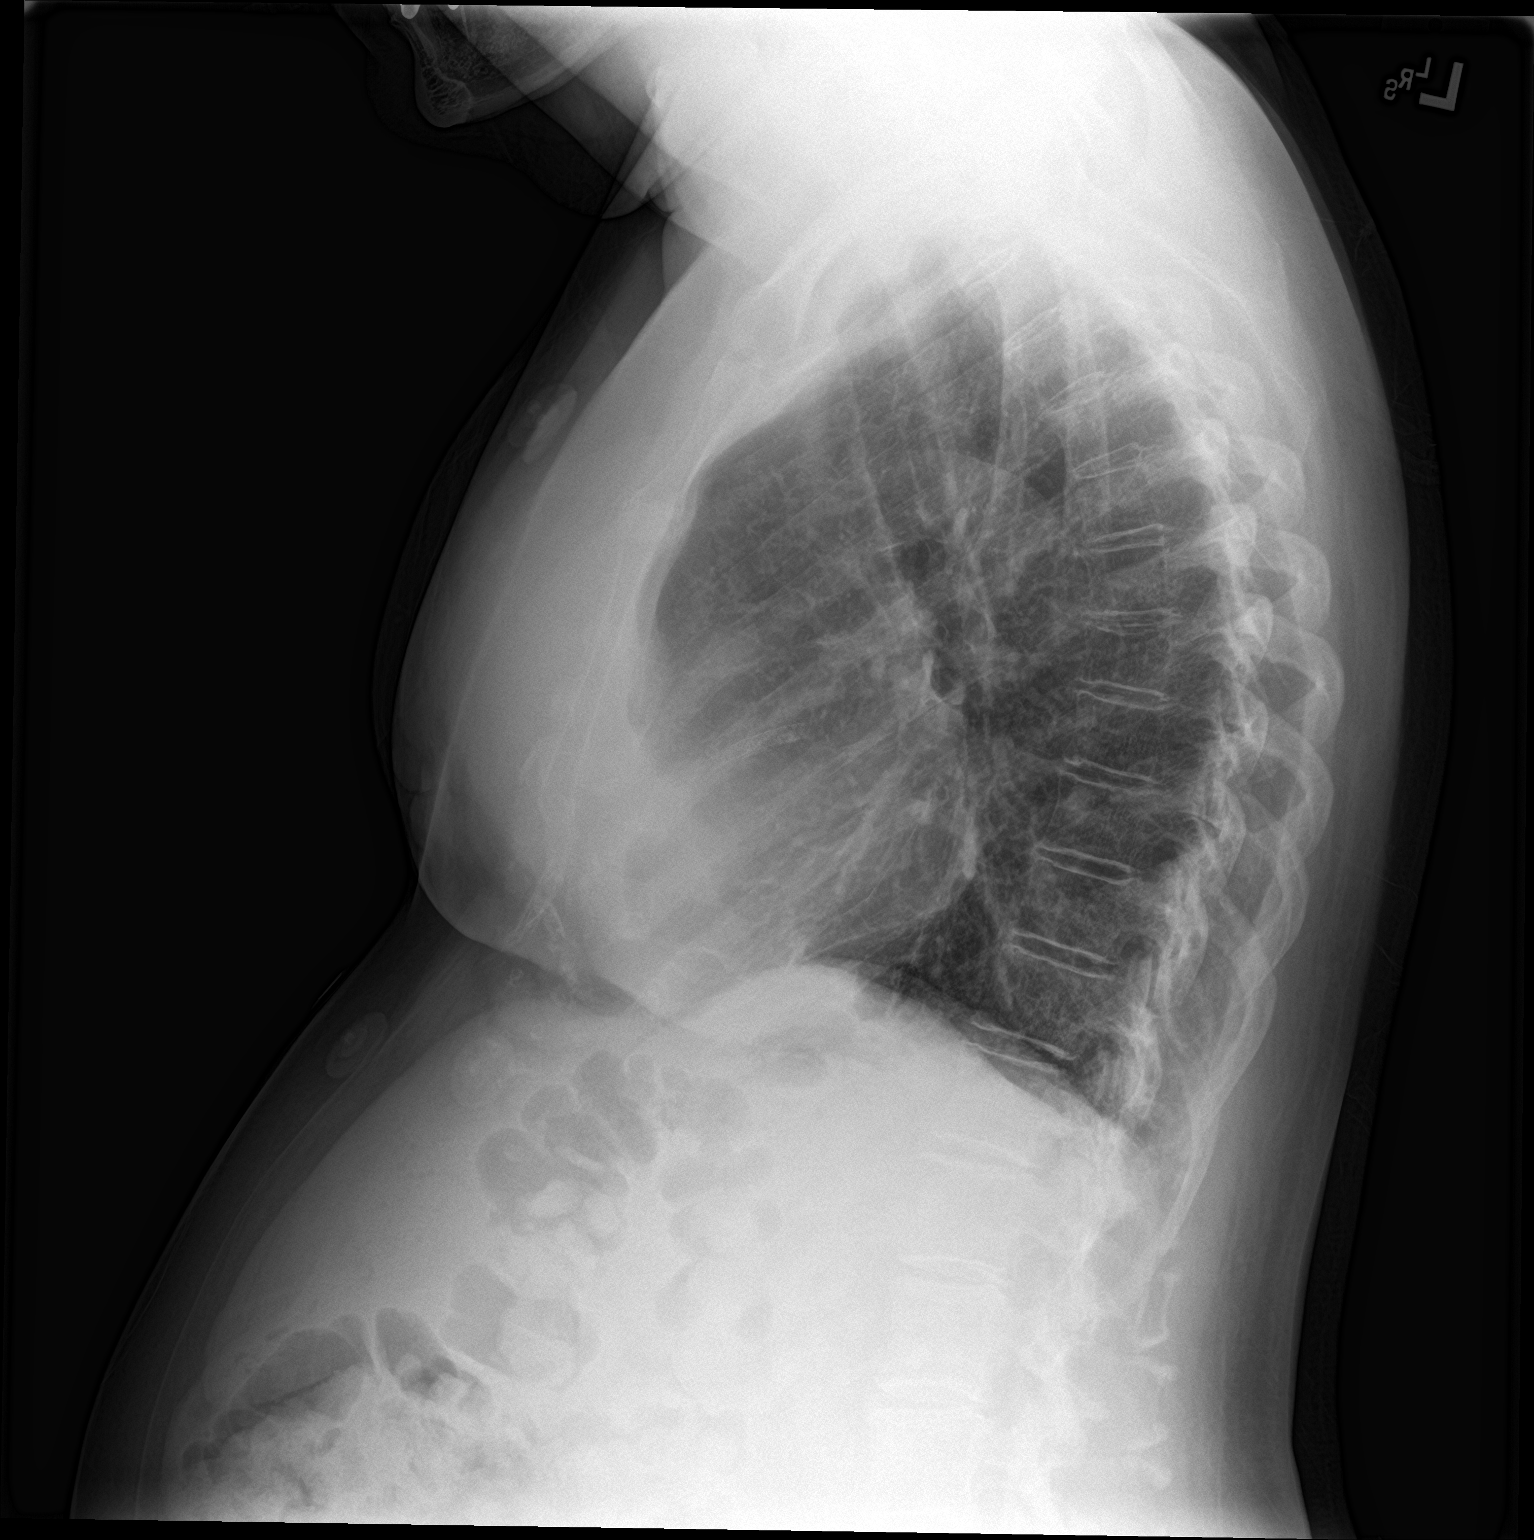

[2 of 2 positions shown; findings below may reference images not displayed]

FINDINGS: Cardiac shadows within normal limits. The lungs are well aerated
bilaterally. No focal infiltrate or sizable effusion is seen. No
bony abnormality is noted.
IMPRESSION: No active cardiopulmonary disease.

## 2019-12-29 NOTE — Progress Notes (Signed)
HPI  F former smoker followed for bronchitis, hx  Occular sarcoid, bronchitis/ nodules, OSA complicated by GERD, glaucoma, DM2 NPSG 12/25/03- AHI  2.9/ hr, desaturation to 91%, body weight 164 lbs NPSG-10/18/13- Mild OSA, AHI 9.3/ hr, weight 164 lbs  ACE 09/17/15-47-WNL ACE level 08/18/16-30 PFT: 01/07/2011-FEV1 1.59/79%, FEV1/FVC 0.73, FEF 25-75% improved to 38% with bronchodilator. TLC 90% DLCO 55% Office Spirometry 06/09/16- moderate restriction of exhaled volume. FVC 1.51/68%, FEV1 1.20/70%, ratio 0.79, FEF 25-75% 1.19/73% Nuclear Stress Test 09/04/16- EF 65%, low risk. ECHO 07/31/16- Gr 1 DD Walk test on room air 12/21/2017-minimal saturation 94%, maximum heart rate 118/minute with no stops walking 3 laps x180 m. Office Spirometry 12/21/17 ---------------------------------------------------------------------------------------------------------------  12/30/2018- 74 yo female former smoker followed for COPD/bronchiolitis/lung nodules, history of ocular sarcoid/ MTX, OSA, complicated by GERD, Glaucoma, DM 2, CAD/ stent, DDD lumbar spine CPAP 9/[employer] Download compliance 100%, AHI 0.9/hr Body weight today 171 lbs Previously described pain in back and chest getting worse, esp with standing, not with deep breath or eating. Relieved by bending and stretching. Recent mamogram ok. 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  12/30/19- 74 yo female former smoker followed for COPD/bronchiolitis/lung nodules, history of ocular sarcoid/ MTX, OSA, complicated by GERD, Glaucoma, DM 2, CAD/ stent, DDD lumbar spine Singulair, Neb albuterol, Ventolin hfa,  CPAP 9/[employer]    Today>> 7 to reduce leak Download-compliance 100%, AHI 0.4/ hr Body weight today-178 lbs Covid vax-3 Ph[employer]  Flu vax- today senior ------Shortness of breath all the time but is worse with  exertion, still having pain in back/chest. Cough that mostly dry but has been productive lately with beige color sputum.  Beige sputum and increased SOB but no acute event or fever.  R ear aches and itches. Sneezing and watery rhinorrhea.  Sees cardiology for chest pain and back pain- wearing monitor.  CXR 01/23/2019- IMPRESSION: No active cardiopulmonary disease.   ROS-see HPI     + = positive Constitutional:   No-   weight loss, night sweats, fevers, chills, fatigue, lassitude. HEENT:   +  headaches, difficulty swallowing, tooth/dental problems, sore throat,       Some  sneezing, itching, ear ache, +nasal congestion, post nasal drip,  CV: + chest pain, no-orthopnea, PND, swelling in lower extremities, anasarca, dizziness, palpitations Resp: +  shortness of breath with exertion not at rest.            productive cough,  + non-productive cough,  No- coughing up of blood.               in color of mucus.   wheezing.   Skin: No-   rash or lesions. GI:  +  heartburn, indigestion, abdominal pain, nausea, vomiting,  GU:  MS:  +  joint pain or swelling, + back pain Neuro-     nothing unusual Psych:  No- change in mood or affect. No depression or anxiety.  No memory loss.  OBJ  Afebrile General- Alert, Oriented, Affect tearful, Distress- none acute, + overweight Skin- rash-none, lesions- none, excoriation- none Lymphadenopathy- none Head- atraumatic            Eyes- Gross vision intact, PERRLA, conjunctivae clear secretions- not injected            Ears- Hearing aid             Nose- + turbinate edema, no-Septal dev, mucus,  polyps, erosion, perforation             Throat- Mallampati III , mucosa  , drainage- none, tonsils- atrophic Neck- flexible , trachea midline, no stridor , thyroid nl, carotid no bruit Chest - symmetrical excursion , unlabored           Heart/CV- RRR , no murmur , no gallop  , no rub, nl s1 s2                           - JVD- none , edema- none, stasis changes-  none, varices- none           Lung-  Cough- none , wheeze-none, rhonchi-none , dullness-none, rub- none           Chest wall-  Abd- No HSM Br/ Gen/ Rectal- Not done, not indicated Extrem- cyanosis- none, clubbing, none, atrophy- none, strength- nl.  Neuro- grossly intact to observation

## 2019-12-30 ENCOUNTER — Encounter (HOSPITAL_COMMUNITY): Payer: Medicare Other

## 2019-12-30 ENCOUNTER — Ambulatory Visit (INDEPENDENT_AMBULATORY_CARE_PROVIDER_SITE_OTHER): Payer: Medicare Other | Admitting: Internal Medicine

## 2019-12-30 ENCOUNTER — Encounter: Payer: Self-pay | Admitting: Internal Medicine

## 2019-12-30 ENCOUNTER — Ambulatory Visit (INDEPENDENT_AMBULATORY_CARE_PROVIDER_SITE_OTHER): Payer: Medicare Other

## 2019-12-30 ENCOUNTER — Other Ambulatory Visit: Payer: Self-pay

## 2019-12-30 VITALS — BP 128/70 | HR 97 | Temp 98.0°F | Ht 62.0 in | Wt 178.0 lb

## 2019-12-30 DIAGNOSIS — J449 Chronic obstructive pulmonary disease, unspecified: Secondary | ICD-10-CM

## 2019-12-30 DIAGNOSIS — G4733 Obstructive sleep apnea (adult) (pediatric): Secondary | ICD-10-CM | POA: Diagnosis not present

## 2019-12-30 DIAGNOSIS — Z23 Encounter for immunization: Secondary | ICD-10-CM

## 2019-12-30 DIAGNOSIS — R911 Solitary pulmonary nodule: Secondary | ICD-10-CM | POA: Diagnosis not present

## 2019-12-30 DIAGNOSIS — I25119 Atherosclerotic heart disease of native coronary artery with unspecified angina pectoris: Secondary | ICD-10-CM

## 2019-12-30 DIAGNOSIS — R918 Other nonspecific abnormal finding of lung field: Secondary | ICD-10-CM

## 2019-12-30 DIAGNOSIS — J441 Chronic obstructive pulmonary disease with (acute) exacerbation: Secondary | ICD-10-CM | POA: Diagnosis not present

## 2019-12-30 MED ORDER — AZITHROMYCIN 250 MG PO TABS: ORAL_TABLET | ORAL | 0 refills | Status: DC

## 2019-12-30 MED ORDER — ALBUTEROL SULFATE HFA 108 (90 BASE) MCG/ACT IN AERS: 2.0000 | INHALATION_SPRAY | Freq: Four times a day (QID) | RESPIRATORY_TRACT | 12 refills | Status: DC | PRN

## 2019-12-30 MED ORDER — TRELEGY ELLIPTA 100-62.5-25 MCG/INH IN AEPB: 1.0000 | INHALATION_SPRAY | Freq: Every day | RESPIRATORY_TRACT | 0 refills | Status: DC

## 2019-12-30 MED ORDER — ALBUTEROL SULFATE HFA 108 (90 BASE) MCG/ACT IN AERS: 2.0000 | INHALATION_SPRAY | Freq: Four times a day (QID) | RESPIRATORY_TRACT | 12 refills | Status: AC | PRN

## 2019-12-30 NOTE — Patient Instructions (Addendum)
Order- DME [employer]- please reduce CPAP to 7, continue mask of choice, humidifier supplies, airview/ card  Order- CXR   Dx COPD mixed type, lung nodules  Order- schedule PFT   Dx COPD mixed type  Script sent for Zpak  Script sent refilling your Ventolin albuterol rescue inhaler    Inhale 2 puffs every 6 hours, as needed  Sample x 2 Trelegy 100 inhaler    Inhale 1 puff, then rinse mouth, once daily  Order- [facility] application

## 2020-01-03 ENCOUNTER — Other Ambulatory Visit: Payer: Self-pay

## 2020-01-03 MED ORDER — ACCU-CHEK AVIVA PLUS W/DEVICE KIT: PACK | 3 refills | Status: AC

## 2020-01-06 ENCOUNTER — Other Ambulatory Visit (HOSPITAL_COMMUNITY)
Admission: RE | Admit: 2020-01-06 | Discharge: 2020-01-06 | Disposition: A | Discharge status: Home / Self Care | Payer: Medicare Other | Source: Ambulatory Visit | Attending: Gastroenterology | Admitting: Gastroenterology

## 2020-01-06 DIAGNOSIS — Z20822 Contact with and (suspected) exposure to covid-19: Secondary | ICD-10-CM | POA: Diagnosis not present

## 2020-01-06 DIAGNOSIS — Z01812 Encounter for preprocedural laboratory examination: Secondary | ICD-10-CM | POA: Diagnosis not present

## 2020-01-06 LAB — SARS CORONAVIRUS 2 (TAT 6-24 HRS): SARS Coronavirus 2: NEGATIVE

## 2020-01-09 ENCOUNTER — Encounter (HOSPITAL_COMMUNITY): Payer: Self-pay | Admitting: Gastroenterology

## 2020-01-09 NOTE — Anesthesia Preprocedure Evaluation (Addendum)
Anesthesia Evaluation  Patient identified by MRN, date of birth, ID band Patient awake    Reviewed: Allergy & Precautions, H&P , NPO status , Patient's Chart, lab work & pertinent test results  Airway Mallampati: II  TM Distance: >3 FB Neck ROM: Full    Dental no notable dental hx. (+) Teeth Intact, Caps   Pulmonary asthma , sleep apnea and Continuous Positive Airway Pressure Ventilation , COPD,  COPD inhaler, former smoker,  Uses Albuterol only prn for SOB.  Does occasionally wheeze and coughs productive daily.   Sarcoidosis   Pulmonary exam normal breath sounds clear to auscultation       Cardiovascular hypertension, Pt. on medications and Pt. on home beta blockers + angina + CAD (on Plavix) and + Peripheral Vascular Disease  + dysrhythmias  Rhythm:Regular Rate:Normal  EKG from 11/2019 reviewed: NSR   Neuro/Psych  Headaches, Anxiety negative psych ROS   GI/Hepatic Neg liver ROS, hiatal hernia, GERD  Medicated and Controlled,  Endo/Other  diabetes, Well Controlled, Type 2, Oral Hypoglycemic Agents  Renal/GU negative Renal ROS  negative genitourinary   Musculoskeletal  (+) Arthritis , Fibromyalgia -  Abdominal Normal abdominal exam  (+)   Peds  Hematology negative hematology ROS (+)   Anesthesia Other Findings   Reproductive/Obstetrics negative OB ROS                            Anesthesia Physical  Anesthesia Plan  ASA: III  Anesthesia Plan: MAC   Post-op Pain Management:    Induction: Intravenous  PONV Risk Score and Plan: 2 and Propofol infusion, TIVA and Treatment may vary due to age or medical condition  Airway Management Planned: Simple Face Mask  Additional Equipment:   Intra-op Plan:   Post-operative Plan:   Informed Consent: I have reviewed the patients History and Physical, chart, labs and discussed the procedure including the risks, benefits and alternatives for the  proposed anesthesia with the patient or authorized representative who has indicated his/her understanding and acceptance.     Dental advisory given  Plan Discussed with: CRNA and Anesthesiologist  Anesthesia Plan Comments:        Anesthesia Quick Evaluation

## 2020-01-10 ENCOUNTER — Encounter (HOSPITAL_COMMUNITY): Payer: Self-pay | Admitting: Gastroenterology

## 2020-01-10 ENCOUNTER — Other Ambulatory Visit: Payer: Self-pay

## 2020-01-10 ENCOUNTER — Ambulatory Visit (HOSPITAL_COMMUNITY): Payer: Medicare Other | Admitting: Certified Registered"

## 2020-01-10 ENCOUNTER — Encounter (HOSPITAL_COMMUNITY)
Admission: RE | Disposition: A | Discharge status: Home / Self Care | Payer: Self-pay | Source: Home / Self Care | Attending: Gastroenterology

## 2020-01-10 ENCOUNTER — Ambulatory Visit (HOSPITAL_COMMUNITY)
Admission: RE | Admit: 2020-01-10 | Discharge: 2020-01-10 | Disposition: A | Discharge status: Home / Self Care | Payer: Medicare Other | Attending: Gastroenterology | Admitting: Gastroenterology

## 2020-01-10 DIAGNOSIS — Z8249 Family history of ischemic heart disease and other diseases of the circulatory system: Secondary | ICD-10-CM | POA: Diagnosis not present

## 2020-01-10 DIAGNOSIS — Z885 Allergy status to narcotic agent status: Secondary | ICD-10-CM | POA: Diagnosis not present

## 2020-01-10 DIAGNOSIS — E1151 Type 2 diabetes mellitus with diabetic peripheral angiopathy without gangrene: Secondary | ICD-10-CM | POA: Diagnosis not present

## 2020-01-10 DIAGNOSIS — Z90710 Acquired absence of both cervix and uterus: Secondary | ICD-10-CM | POA: Diagnosis not present

## 2020-01-10 DIAGNOSIS — K219 Gastro-esophageal reflux disease without esophagitis: Secondary | ICD-10-CM | POA: Diagnosis not present

## 2020-01-10 DIAGNOSIS — Z809 Family history of malignant neoplasm, unspecified: Secondary | ICD-10-CM | POA: Diagnosis not present

## 2020-01-10 DIAGNOSIS — E1165 Type 2 diabetes mellitus with hyperglycemia: Secondary | ICD-10-CM | POA: Diagnosis not present

## 2020-01-10 DIAGNOSIS — H42 Glaucoma in diseases classified elsewhere: Secondary | ICD-10-CM | POA: Insufficient documentation

## 2020-01-10 DIAGNOSIS — E1139 Type 2 diabetes mellitus with other diabetic ophthalmic complication: Secondary | ICD-10-CM | POA: Diagnosis not present

## 2020-01-10 DIAGNOSIS — K635 Polyp of colon: Secondary | ICD-10-CM | POA: Diagnosis not present

## 2020-01-10 DIAGNOSIS — I25119 Atherosclerotic heart disease of native coronary artery with unspecified angina pectoris: Secondary | ICD-10-CM | POA: Insufficient documentation

## 2020-01-10 DIAGNOSIS — Z820 Family history of epilepsy and other diseases of the nervous system: Secondary | ICD-10-CM | POA: Diagnosis not present

## 2020-01-10 DIAGNOSIS — D869 Sarcoidosis, unspecified: Secondary | ICD-10-CM | POA: Diagnosis not present

## 2020-01-10 DIAGNOSIS — Z7984 Long term (current) use of oral hypoglycemic drugs: Secondary | ICD-10-CM | POA: Insufficient documentation

## 2020-01-10 DIAGNOSIS — Z87891 Personal history of nicotine dependence: Secondary | ICD-10-CM | POA: Diagnosis not present

## 2020-01-10 DIAGNOSIS — Z91013 Allergy to seafood: Secondary | ICD-10-CM | POA: Insufficient documentation

## 2020-01-10 DIAGNOSIS — Z86010 Personal history of colonic polyps: Secondary | ICD-10-CM | POA: Diagnosis not present

## 2020-01-10 DIAGNOSIS — M797 Fibromyalgia: Secondary | ICD-10-CM | POA: Diagnosis not present

## 2020-01-10 DIAGNOSIS — Z1211 Encounter for screening for malignant neoplasm of colon: Secondary | ICD-10-CM | POA: Diagnosis not present

## 2020-01-10 DIAGNOSIS — Z833 Family history of diabetes mellitus: Secondary | ICD-10-CM | POA: Diagnosis not present

## 2020-01-10 DIAGNOSIS — Z79899 Other long term (current) drug therapy: Secondary | ICD-10-CM | POA: Diagnosis not present

## 2020-01-10 DIAGNOSIS — I50810 Right heart failure, unspecified: Secondary | ICD-10-CM | POA: Insufficient documentation

## 2020-01-10 DIAGNOSIS — J449 Chronic obstructive pulmonary disease, unspecified: Secondary | ICD-10-CM | POA: Diagnosis not present

## 2020-01-10 DIAGNOSIS — Z955 Presence of coronary angioplasty implant and graft: Secondary | ICD-10-CM | POA: Insufficient documentation

## 2020-01-10 DIAGNOSIS — K5730 Diverticulosis of large intestine without perforation or abscess without bleeding: Secondary | ICD-10-CM | POA: Diagnosis not present

## 2020-01-10 DIAGNOSIS — Z7982 Long term (current) use of aspirin: Secondary | ICD-10-CM | POA: Insufficient documentation

## 2020-01-10 DIAGNOSIS — D123 Benign neoplasm of transverse colon: Secondary | ICD-10-CM | POA: Diagnosis not present

## 2020-01-10 DIAGNOSIS — Z888 Allergy status to other drugs, medicaments and biological substances status: Secondary | ICD-10-CM | POA: Insufficient documentation

## 2020-01-10 DIAGNOSIS — I110 Hypertensive heart disease with heart failure: Secondary | ICD-10-CM | POA: Diagnosis not present

## 2020-01-10 HISTORY — PX: COLONOSCOPY WITH PROPOFOL: SHX5780

## 2020-01-10 HISTORY — PX: POLYPECTOMY: SHX5525

## 2020-01-10 LAB — GLUCOSE, CAPILLARY: Glucose-Capillary: 118 mg/dL — ABNORMAL HIGH (ref 70–99)

## 2020-01-10 SURGERY — COLONOSCOPY WITH PROPOFOL
Anesthesia: Monitor Anesthesia Care

## 2020-01-10 MED ORDER — PROPOFOL 10 MG/ML IV BOLUS
INTRAVENOUS | Status: AC
  Filled 2020-01-10: qty 40

## 2020-01-10 MED ORDER — LIDOCAINE HCL (CARDIAC) PF 100 MG/5ML IV SOSY
PREFILLED_SYRINGE | INTRAVENOUS | Status: DC | PRN
  Administered 2020-01-10: 60 mg via INTRAVENOUS

## 2020-01-10 MED ORDER — LACTATED RINGERS IV SOLN: INTRAVENOUS | Status: DC | PRN

## 2020-01-10 MED ORDER — PROPOFOL 500 MG/50ML IV EMUL
INTRAVENOUS | Status: DC | PRN
  Administered 2020-01-10: 125 ug/kg/min via INTRAVENOUS

## 2020-01-10 MED ORDER — PROPOFOL 1000 MG/100ML IV EMUL
INTRAVENOUS | Status: AC
  Filled 2020-01-10: qty 100

## 2020-01-10 MED ORDER — PROPOFOL 10 MG/ML IV BOLUS
INTRAVENOUS | Status: DC | PRN
  Administered 2020-01-10: 30 mg via INTRAVENOUS

## 2020-01-10 SURGICAL SUPPLY — 22 items

## 2020-01-10 NOTE — Transfer of Care (Signed)
Immediate Anesthesia Transfer of Care Note  Patient: Sheila Reynolds  Procedure(s) Performed: COLONOSCOPY WITH PROPOFOL (N/A ) POLYPECTOMY  Patient Location: PACU  Anesthesia Type:MAC  Level of Consciousness: drowsy  Airway & Oxygen Therapy: Patient Spontanous Breathing  Post-op Assessment: Report given to RN and Post -op Vital signs reviewed and stable  Post vital signs: Reviewed and stable  Last Vitals:  Vitals Value Taken Time  BP    Temp    Pulse    Resp    SpO2      Last Pain:  Vitals:   01/10/20 0719  TempSrc: Oral  PainSc: 0-No pain         Complications: No complications documented.

## 2020-01-10 NOTE — H&P (Signed)
Sheila Reynolds is an 74 y.o. female.   Chief Complaint: Colorectal cancer screening. HPI: Sheila Reynolds is a 74 year old black female with multiple medical problems listed below, who presents to [facility] today, for screening colonoscopy.  Her last colonoscopy was done in 2016 and tubular adenomas were removed.  Please refer to office notes for further details.  Past Medical History:  Diagnosis Date   Anemia    3 months ago anemic   Anxiety    on meds   Arthritis    "all over" (01/15/2017)   Asthma    Bronchitis with emphysema    Chest pain    Chronic bronchitis ([place])    Coronary artery disease    a. 01/2017 she underwent orbital atherectomy/DES to the proxmal LAD and PTCA to ostial D2. 2D Echo 01/15/17 showed mild LVH, EF 60-65%, grade 1 DD.   Family history of anesthesia complication    daughter N/V   Fibromyalgia    GERD (gastroesophageal reflux disease)    on meds   Glaucoma    Heart murmur    History of hiatal hernia    Hx of echocardiogram    Echo (03/2013):  Tech limited; Mild focal basal septal hypertrophy, EF 60-65%, normal RVF   Hyperlipidemia    Hypertension    Nonsustained ventricular tachycardia (HCC)    OSA on CPAP    Premature atrial contractions    PVC (premature ventricular contraction)    a. Holter 12/16: NSR, occ PAC,PVCs   Sarcoidosis    Type II diabetes mellitus ([place])    Past Surgical History:  Procedure Laterality Date   BREAST SURGERY     CARDIAC CATHETERIZATION  05/04/2007   reveals overall normal left ventricular systolic function. Ejection fraction 65-70%   CATARACT EXTRACTION W/PHACO Right 07/20/2013   Procedure: CATARACT EXTRACTION PHACO AND INTRAOCULAR LENS PLACEMENT (IOC);  Surgeon: Marylynn Pearson, MD;  Location: [place];  Service: Ophthalmology;  Laterality: Right;   COLONOSCOPY W/ BIOPSIES AND POLYPECTOMY     COLONOSCOPY WITH PROPOFOL N/A 09/07/2014   Procedure: COLONOSCOPY WITH PROPOFOL;  Surgeon:  Juanita Craver, MD;  Location: [facility];  Service: Endoscopy;  Laterality: N/A;   CORONARY ANGIOPLASTY WITH STENT PLACEMENT  01/15/2017   CORONARY ATHERECTOMY N/A 01/15/2017   Procedure: CORONARY ATHERECTOMY;  Surgeon: Nelva Bush, MD;  Location: [place] [facility];  Service: Cardiovascular;  Laterality: N/A;   CORONARY BALLOON ANGIOPLASTY N/A 01/15/2017   Procedure: CORONARY BALLOON ANGIOPLASTY;  Surgeon: Nelva Bush, MD;  Location: [place] [facility];  Service: Cardiovascular;  Laterality: N/A;   CORONARY STENT INTERVENTION N/A 01/15/2017   Procedure: CORONARY STENT INTERVENTION;  Surgeon: Nelva Bush, MD;  Location: [place] [facility];  Service: Cardiovascular;  Laterality: N/A;   ESOPHAGOGASTRODUODENOSCOPY (EGD) WITH PROPOFOL N/A 09/07/2014   Procedure: ESOPHAGOGASTRODUODENOSCOPY (EGD) WITH PROPOFOL;  Surgeon: Juanita Craver, MD;  Location[facility];  Service: Endoscopy;  Laterality: N/A;   EXTERNAL EAR SURGERY Bilateral 1970s   tumors removed   EYE SURGERY Left 2019   cataract extraction    EYE SURGERY Right 02/09/2018   eyelid surgery    INTRAVASCULAR PRESSURE WIRE/FFR STUDY N/A 12/12/2016   Procedure: INTRAVASCULAR PRESSURE WIRE/FFR STUDY;  Surgeon: Nelva Bush, MD;  Location: [place] [facility];  Service: Cardiovascular;  Laterality: N/A;   MINI SHUNT INSERTION Right 07/20/2013   Procedure: INSERTION OF GLAUCOMA FILTRATION DEVICE RIGHT EYE;  Surgeon: Marylynn Pearson, MD;  Location: [place];  Service: Ophthalmology;  Laterality: Right;  MITOMYCIN C APPLICATION Right 2/40/9735   Procedure: MITOMYCIN C APPLICATION;  Surgeon: Marylynn Pearson, MD;  Location: [place];  Service: Ophthalmology;  Laterality: Right;   MITOMYCIN C APPLICATION Right 05/08/9240   Procedure: MITOMYCIN C APPLICATION RIGHT EYE;  Surgeon: Marylynn Pearson, MD;  Location: [place];  Service: Ophthalmology;  Laterality: Right;   PLACEMENT OF BREAST IMPLANTS Bilateral 1992   "took all my breast tissue out;  put implants in;fibrocystic breast disease "   RIGHT/LEFT HEART CATH AND CORONARY ANGIOGRAPHY N/A 12/12/2016   Procedure: RIGHT/LEFT HEART CATH AND CORONARY ANGIOGRAPHY;  Surgeon: Nelva Bush, MD;  Location: [place] [facility];  Service: Cardiovascular;  Laterality: N/A;   TONSILLECTOMY     TOTAL ABDOMINAL HYSTERECTOMY  1982   TRABECULECTOMY Right 02/21/2015   Procedure: TRABECULECTOMY WITH [facility] ON THE RIGHT EYE;  Surgeon: Marylynn Pearson, MD;  Location: [place];  Service: Ophthalmology;  Laterality: Right;   Family History  Problem Relation Age of Onset   Heart disease Father    Diabetes Father    Glaucoma Father    Cancer Mother        unknown type, ?lung   Heart disease Paternal Grandmother    Cancer Paternal Grandmother        unknown type   Diabetes Brother    Glaucoma Brother    Social History:  reports that she quit smoking about 39 years ago. Her smoking use included cigarettes. She has a 4.00 pack-year smoking history. She has never used smokeless tobacco. She reports that she does not drink alcohol and does not use drugs.  Allergies:  Allergies  Allergen Reactions   Crestor [Rosuvastatin Calcium] Other (See Comments)    muscle aches   Demerol  [Meperidine Hcl]     Other reaction(s): Hallucinations   Shellfish Allergy Itching and Other (See Comments)    Crab, shrimp and lobster ---lips itch and tingle Was told not to eat again after having a allergy test. Lobster, crab and shrimp    Medications Prior to Admission  Medication Sig Dispense Refill   albuterol (PROVENTIL) (2.5 MG/3ML) 0.083% nebulizer solution Take 3 mLs (2.5 mg total) by nebulization daily as needed for wheezing or shortness of breath. 75 mL 12   albuterol (VENTOLIN HFA) 108 (90 Base) MCG/ACT inhaler Inhale 2 puffs into the lungs every 6 (six) hours as needed for wheezing or shortness of breath. 18 g 12   atorvastatin (LIPITOR) 80 MG tablet TAKE 1 TABLET(80 MG) BY MOUTH DAILY (Patient  taking differently: Take 80 mg by mouth at bedtime. ) 90 tablet 2   azithromycin (ZITHROMAX) 250 MG tablet Take 2 tablets first day then 1 tablet daily until finished. (Patient taking differently: Take 250-500 mg by mouth See admin instructions. Take 2 tablets (500 mg) by mouth on day 1, then take 1 tablet (250 mg) by mouth daily until finished.) 6 tablet 0   Blood Glucose Monitoring Suppl (ACCU-CHEK AVIVA PLUS) w/Device KIT Use to check blood sugars 3 times a day. Dx code e11.65 1 kit 3   brimonidine (ALPHAGAN P) 0.1 % SOLN Place 1 drop into both eyes 2 (two) times daily.      brinzolamide (AZOPT) 1 % ophthalmic suspension Place 1 drop into both eyes 3 (three) times daily.      carvedilol (COREG) 12.5 MG tablet TAKE 1 TABLET(12.5 MG) BY MOUTH TWICE DAILY (Patient taking differently: Take 12.5 mg by mouth in the morning and at bedtime. ) 180 tablet 3   clopidogrel (PLAVIX)  75 MG tablet Take 1 tablet (75 mg total) by mouth daily. 90 tablet 3   Fluticasone-Umeclidin-Vilant (TRELEGY ELLIPTA) 100-62.5-25 MCG/INH AEPB Inhale 1 puff into the lungs daily. 28 each 0   furosemide (LASIX) 40 MG tablet Take 1 tablet (40 mg total) by mouth every other day. (Patient taking differently: Take 40 mg by mouth every 3 (three) days. ) 90 tablet 1   JANUMET 50-500 MG tablet TAKE 1 TABLET BY MOUTH TWICE DAILY WITH A MEAL (Patient taking differently: Take 1 tablet by mouth in the morning and at bedtime. ) 180 tablet 1   Lancets (ONETOUCH DELICA PLUS [id number]) MISC CHECK BLOOD SUGAR BEFORE BREAKFAST AND DINNER 100 each 1   Misc Natural Products (FOCUSED MIND PO) Take 4 tablets by mouth daily. For memory     montelukast (SINGULAIR) 10 MG tablet TAKE 1 TABLET(10 MG) BY MOUTH EVERY EVENING (Patient taking differently: Take 10 mg by mouth at bedtime. ) 90 tablet 1   Nebulizers (COMPRESSOR/NEBULIZER) MISC Use as directed 1 each 0   pantoprazole (PROTONIX) 40 MG tablet TAKE 1 TABLET(40 MG) BY MOUTH DAILY (Patient  taking differently: Take 40 mg by mouth daily before breakfast. ) 30 tablet 11   Polyvinyl Alcohol-Povidone PF (REFRESH) 1.4-0.6 % SOLN Place 1-2 drops into both eyes 3 (three) times daily as needed (dry/irritated eyes.).     potassium chloride SA (KLOR-CON) 20 MEQ tablet Take 1 tablet (20 mEq total) by mouth every other day. (Patient taking differently: Take 20 mEq by mouth every 3 (three) days. ) 90 tablet 1   prednisoLONE acetate (PRED FORTE) 1 % ophthalmic suspension Place 1 drop into both eyes 4 (four) times daily.      pregabalin (LYRICA) 75 MG capsule TAKE 1 CAPSULE(75 MG) BY MOUTH TWICE DAILY (Patient taking differently: Take 75 mg by mouth in the morning and at bedtime. ) 180 capsule 1   Travoprost, BAK Free, (TRAVATAN) 0.004 % SOLN ophthalmic solution Place 1 drop into the right eye at bedtime.      acetaminophen (TYLENOL) 500 MG tablet Take 1,000 mg by mouth 2 (two) times daily as needed for moderate pain or headache.     aspirin EC 81 MG tablet Take 81 mg by mouth daily. Swallow whole.     isosorbide mononitrate (IMDUR) 60 MG 24 hr tablet TAKE 1 TABLET(60 MG) BY MOUTH DAILY (Patient taking differently: Take 60 mg by mouth daily. ) 90 tablet 3   nitroGLYCERIN (NITROSTAT) 0.4 MG SL tablet Place 1 tablet (0.4 mg total) under the tongue every 5 (five) minutes as needed for chest pain. (Patient not taking: Reported on 12/30/2019) 25 tablet prn   Review of Systems  Constitutional: Negative.   HENT: Negative.   Eyes: Negative.   Respiratory: Negative.   Cardiovascular: Negative.   Gastrointestinal: Positive for constipation. Negative for abdominal distention, abdominal pain, anal bleeding, blood in stool, diarrhea, nausea, rectal pain and vomiting.  Endocrine: Negative.   Genitourinary: Negative.   Musculoskeletal: Positive for arthralgias and myalgias.  Allergic/Immunologic: Negative.   Neurological: Negative.   Hematological: Negative.   Psychiatric/Behavioral: Positive for  dysphoric mood and sleep disturbance.   Physical Exam Vitals and nursing note reviewed.  Constitutional:      Appearance: Normal appearance. She is well-developed.  HENT:     Head: Normocephalic and atraumatic.  Eyes:     Extraocular Movements: Extraocular movements intact.     Pupils: Pupils are equal, round, and reactive to light.  Cardiovascular:  Rate and Rhythm: Normal rate and regular rhythm.  Chest:     Comments: Is a cardiac monitor present on vent anterior aspect of the chest just below the left clavicle Abdominal:     General: Bowel sounds are normal. There is no distension.     Palpations: There is no mass.     Tenderness: There is no abdominal tenderness. There is no guarding or rebound.     Hernia: No hernia is present.  Musculoskeletal:     Cervical back: Normal range of motion and neck supple.  Skin:    General: Skin is warm and dry.  Neurological:     General: No focal deficit present.     Mental Status: She is alert and oriented to person, place, and time.  Psychiatric:        Mood and Affect: Mood normal.        Behavior: Behavior normal. Behavior is cooperative.        Thought Content: Thought content normal.        Judgment: Judgment normal.    Assessment/Plan Colorectal cancer screening/personal history of tubular adenomas-proceed with a colonoscopy at this time.  Juanita Craver, MD 01/10/2020, 7:12 AM

## 2020-01-10 NOTE — Anesthesia Postprocedure Evaluation (Signed)
Anesthesia Post Note  Patient: Sheila Reynolds  Procedure(s) Performed: COLONOSCOPY WITH PROPOFOL (N/A ) POLYPECTOMY     Patient location during evaluation: Endoscopy Anesthesia Type: MAC Level of consciousness: awake and alert Pain management: pain level controlled Vital Signs Assessment: post-procedure vital signs reviewed and stable Respiratory status: spontaneous breathing, nonlabored ventilation and respiratory function stable Cardiovascular status: blood pressure returned to baseline and stable Postop Assessment: no apparent nausea or vomiting Anesthetic complications: no   No complications documented.  Last Vitals:  Vitals:   01/10/20 0810 01/10/20 0820  BP: (!) 147/79 (!) 150/81  Pulse: 75 78  Resp: 13 15  Temp:    SpO2: 99% 99%    Last Pain:  Vitals:   01/10/20 0803  TempSrc: Oral  PainSc:                  [name]

## 2020-01-10 NOTE — Discharge Instructions (Signed)
YOU HAD AN ENDOSCOPIC PROCEDURE TODAY: Refer to the procedure report and other information in the discharge instructions given to you for any specific questions about what was found during the examination. If this information does not answer your questions, please call [facility] office at [phone] to clarify.   YOU SHOULD EXPECT: Some feelings of bloating in the abdomen. Passage of more gas than usual. Walking can help get rid of the air that was put into your GI tract during the procedure and reduce the bloating. If you had a lower endoscopy (such as a colonoscopy or flexible sigmoidoscopy) you may notice spotting of blood in your stool or on the toilet paper. Some abdominal soreness may be present for a day or two, also.  DIET: Your first meal following the procedure should be a light meal and then it is ok to progress to your normal diet. A half-sandwich or bowl of soup is an example of a good first meal. Heavy or fried foods are harder to digest and may make you feel nauseous or bloated. Drink plenty of fluids but you should avoid alcoholic beverages for 24 hours. If you had a esophageal dilation, please see attached instructions for diet.   ACTIVITY: Your care partner should take you home directly after the procedure. You should plan to take it easy, moving slowly for the rest of the day. You can resume normal activity the day after the procedure however YOU SHOULD NOT DRIVE, use power tools, machinery or perform tasks that involve climbing or major physical exertion for 24 hours (because of the sedation medicines used during the test).   SYMPTOMS TO REPORT IMMEDIATELY: A gastroenterologist can be reached at any hour. Please call [phone]  for any of the following symptoms:  . Following lower endoscopy (colonoscopy, flexible sigmoidoscopy) Excessive amounts of blood in the stool  Significant tenderness, worsening of abdominal pains  Swelling of the abdomen that is new, acute  Fever of 100  or higher   FOLLOW UP:  If any biopsies were taken you will be contacted by phone or by letter within the next 1-3 weeks. Call [phone]  if you have not heard about the biopsies in 3 weeks.  Please also call with any specific questions about appointments or follow up tests. 

## 2020-01-10 NOTE — Op Note (Signed)
[facility] Patient Name: Sheila Reynolds Procedure Date: 01/10/2020 MRN: [id number] Attending MD: Juanita Craver , MD Date of Birth: Dec 29, 1945 CSN: [id number] Age: 74 Admit Type: Outpatient Procedure:                Colonoscopy with cold snare polypectomy x 1. Indications:              Personal, history of colonic polyps-tubular                            adenomas; CRC screening for colorectal malignant                            neoplasm. Providers:                Juanita Craver, MD, Cleda Daub, RN, Benetta Spar, Technician, Ladona Ridgel, Technician,                            Nicholes Calamity, CRNA. Referring MD:             Theda Belfast. Baird Cancer, MD Medicines:                Monitored Anesthesia Care Complications:            No immediate complications. Estimated Blood Loss:     Estimated blood loss was minimal. Procedure:                Pre-Anesthesia Assessment: - Prior to the                            procedure, a history and physical was performed,                            and patient medications and allergies were                            reviewed. The patient's tolerance of previous                            anesthesia was also reviewed. The risks and                            benefits of the procedure and the sedation options                            and risks were discussed with the patient. All                            questions were answered, and informed consent was                            obtained. Prior Anticoagulants: The patient has  taken Plavix (clopidogrel), last dose was 5 days                            prior to procedure. ASA Grade Assessment: IV - A                            patient with severe systemic disease that is a                            constant threat to life. After reviewing the risks                            and benefits, the patient was deemed in                             satisfactory condition to undergo the procedure.                           After obtaining informed consent, the colonoscope                            was passed under direct vision. Throughout the                            procedure, the patient's blood pressure, pulse, and                            oxygen saturations were monitored continuously. The                            CF-HQ190L ([id number]) Olympus colonoscope was                            introduced through the anus and advanced to the the                            cecum, identified by appendiceal orifice and                            ileocecal valve. The colonoscopy was performed                            without difficulty. The patient tolerated the                            procedure well. The quality of the bowel                            preparation was adequate. The ileocecal valve, the                            appendiceal orifice and the rectum were  photographed. The bowel preparation used was                            Clenpiq via split dose instruction. Scope In: 7:38:25 AM Scope Out: 7:53:42 AM Scope Withdrawal Time: 0 hours 10 minutes 29 seconds  Total Procedure Duration: 0 hours 15 minutes 17 seconds  Findings:      A 6 mm sessile polyp was found in the hepatic flexure; the polyp was       removed with a cold snare x 1; resection and retrieval were complete.      A couple of small-mouthed diverticula were found in the cecum.      The exam was otherwise without abnormality on direct and retroflexion       views.      The entire examined colon appeared normal. Impression:               - One 6 mm sessile polyp at the hepatic flexure,                            removed with a cold snare x 1; resected and                            retrieved.                           - A couple of diverticula in the cecum.                           - The examination was otherwise  normal on direct                            and retroflexion views.                           - The entire examined colon is normal. Moderate Sedation:      MAC used. Recommendation:           - High fiber, cardiac diet daily.                           - Continue present medications.                           - Await pathology results.                           - Repeat colonoscopy is not recommended for                            surveillance due to patient's age.                           - Return to GI office PRN.                           - If the patient has any abnormal GI symptoms in  the interim, she has been advised to call the                            office ASAP for further recommendations. Procedure Code(s):        --- Professional ---                           [phone], Colonoscopy, flexible; with removal of                            tumor(s), polyp(s), or other lesion(s) by snare                            technique Diagnosis Code(s):        --- Professional ---                           K63.5, Polyp of colon                           K57.30, Diverticulosis of large intestine without                            perforation or abscess without bleeding                           Z86.010, Personal history of colonic polyps CPT copyright 2019 American Medical Association. All rights reserved. The codes documented in this report are preliminary and upon coder review may  be revised to meet current compliance requirements. Juanita Craver, MD Juanita Craver, MD 01/10/2020 8:07:06 AM This report has been signed electronically. Number of Addenda: 0

## 2020-01-11 ENCOUNTER — Telehealth: Payer: Medicare Other

## 2020-01-11 ENCOUNTER — Encounter (HOSPITAL_COMMUNITY): Payer: Self-pay | Admitting: Gastroenterology

## 2020-01-11 ENCOUNTER — Telehealth: Payer: Self-pay | Admitting: Internal Medicine

## 2020-01-11 ENCOUNTER — Telehealth: Payer: Self-pay

## 2020-01-11 LAB — SURGICAL PATHOLOGY

## 2020-01-11 MED ORDER — TRELEGY ELLIPTA 100-62.5-25 MCG/INH IN AEPB
1.0000 | INHALATION_SPRAY | Freq: Every day | RESPIRATORY_TRACT | 5 refills | Status: DC
Start: 2020-01-11 — End: 2020-04-27

## 2020-01-11 NOTE — Telephone Encounter (Signed)
I have called and LM on VM to make the pt aware that the trelegy has been sent to the pharmacy for refills and nothing further is needed.

## 2020-01-11 NOTE — Telephone Encounter (Cosign Needed)
  Chronic Care Management   Outreach Note  01/11/2020 Name: Sheila Reynolds MRN: [id number] DOB: [birth year]  Referred by: Glendale Chard, MD Reason for referral : Chronic Care Management (RNCM FU Call )   An unsuccessful telephone outreach was attempted today. The patient was referred to the case management team for assistance with care management and care coordination.   Follow Up Plan: A HIPAA compliant phone message was left for the patient providing contact information and requesting a return call.  Telephone follow up appointment with care management team member scheduled for: 02/21/20  Barb Merino, RN, BSN, CCM Care Management Coordinator [facility]  Direct Phone: [phone]

## 2020-01-12 ENCOUNTER — Other Ambulatory Visit: Payer: Self-pay | Admitting: Internal Medicine

## 2020-01-12 NOTE — Telephone Encounter (Signed)
Pregabalin refill

## 2020-01-19 DIAGNOSIS — H3581 Retinal edema: Secondary | ICD-10-CM | POA: Diagnosis not present

## 2020-01-19 DIAGNOSIS — E119 Type 2 diabetes mellitus without complications: Secondary | ICD-10-CM | POA: Diagnosis not present

## 2020-01-19 DIAGNOSIS — H43813 Vitreous degeneration, bilateral: Secondary | ICD-10-CM | POA: Diagnosis not present

## 2020-01-19 DIAGNOSIS — H2013 Chronic iridocyclitis, bilateral: Secondary | ICD-10-CM | POA: Diagnosis not present

## 2020-01-19 DIAGNOSIS — H35373 Puckering of macula, bilateral: Secondary | ICD-10-CM | POA: Diagnosis not present

## 2020-01-19 LAB — HM DIABETES EYE EXAM

## 2020-01-29 NOTE — Assessment & Plan Note (Addendum)
Benefits from CPAP with good compliance and control Plan- try reducing CPAP to 7 to reduce leak

## 2020-01-29 NOTE — Assessment & Plan Note (Addendum)
Recent exacerbation Plan- samples Breztri for trial, refill Ventolin, Flu vax senior, Overnight Oximetry and 6 MWT to assess O2 need.

## 2020-02-02 ENCOUNTER — Telehealth: Payer: Self-pay | Admitting: Cardiovascular Disease

## 2020-02-02 MED ORDER — CLOPIDOGREL BISULFATE 75 MG PO TABS
75.0000 mg | ORAL_TABLET | Freq: Every day | ORAL | 3 refills | Status: DC
Start: 2020-02-02 — End: 2021-02-18

## 2020-02-02 NOTE — Telephone Encounter (Signed)
Pt's medication was sent to pt's pharmacy as requested. Confirmation received.  °

## 2020-02-02 NOTE — Telephone Encounter (Signed)
  *  STAT* If patient is at the pharmacy, call can be transferred to refill team.   1. Which medications need to be refilled? (please list name of each medication and dose if known)   clopidogrel (PLAVIX) 75 MG tablet    2. Which pharmacy/location (including street and city if local pharmacy) is medication to be sent to? [facility] [place], [place]  3. Do they need a 30 day or 90 day supply? 90 days   Pt been out of medications since last week

## 2020-02-16 ENCOUNTER — Telehealth: Payer: Self-pay | Admitting: Internal Medicine

## 2020-02-16 NOTE — Telephone Encounter (Signed)
She stopped trelegy 1-2 weeks ago after reading listed side effects, she felt she had most of them including sob, chest pain, back pain, lightheadedness, vision changes. She told me she has had this back pain for a long time and that you were aware of it. She currently is just using Albuterol 2-3 times a day. She has PFTs and fu with you end of February. Nothing further needed at this time. Thanks.

## 2020-02-16 NOTE — Telephone Encounter (Signed)
I'm not sure how Trelegy would be related to back pain. Suggest she stay off Trelegy for now. Treat back pain with heat and Advil or similar, and see how her breathing feels.

## 2020-02-16 NOTE — Telephone Encounter (Signed)
Patient reports that had a reaction to Trelegy, she stopped inhaler two weeks d/t side effects. Patient reports back pain and shortness of breath. These symptoms are not new but appear worse as of late. She uses her albuterol rescue inhaler upwards 2-3 times a day. She has an apt with Dr. Maple Hudson on February 25th with PFTs.   Dr. Maple Hudson do you have any recommendations for back pain or different inhaler you want her to try?

## 2020-02-17 ENCOUNTER — Other Ambulatory Visit: Payer: Self-pay

## 2020-02-17 ENCOUNTER — Ambulatory Visit (INDEPENDENT_AMBULATORY_CARE_PROVIDER_SITE_OTHER): Payer: Medicare Other | Admitting: Nurse Practitioner

## 2020-02-17 ENCOUNTER — Encounter: Payer: Self-pay | Admitting: Nurse Practitioner

## 2020-02-17 VITALS — BP 118/70 | HR 80 | Temp 97.9°F | Ht 62.0 in | Wt 173.8 lb

## 2020-02-17 DIAGNOSIS — M545 Low back pain, unspecified: Secondary | ICD-10-CM

## 2020-02-17 DIAGNOSIS — R059 Cough, unspecified: Secondary | ICD-10-CM

## 2020-02-17 DIAGNOSIS — Z20822 Contact with and (suspected) exposure to covid-19: Secondary | ICD-10-CM | POA: Diagnosis not present

## 2020-02-17 LAB — POC COVID19 BINAXNOW: SARS Coronavirus 2 Ag: NEGATIVE

## 2020-02-17 MED ORDER — AZITHROMYCIN 250 MG PO TABS: ORAL_TABLET | ORAL | 0 refills | Status: AC

## 2020-02-17 NOTE — Progress Notes (Signed)
I,Yamilka Roman [employer] as a Education administrator for [employer], NP.,have documented all relevant documentation on the behalf of [employer], NP,as directed by  Bary Castilla, NP while in the presence of Bary Castilla, NP. This visit occurred during the SARS-CoV-2 public health emergency.  Safety protocols were in place, including screening questions prior to the visit, additional usage of staff PPE, and extensive cleaning of exam room while observing appropriate contact time as indicated for disinfecting solutions.  Subjective:     Patient ID: Sheila Reynolds , female    DOB: 12-13-1945 , 75 y.o.   MRN: [id number]   Chief Complaint  Patient presents with  . covid symptoms    Patient is having some back pain,cough nausea and SOB    HPI  Patient presents today for a sick visit she stated she is having some back pain,nausea and some SOB but that has gotten better. She did have a fever a couple of nights ago. She does have cough and congestion. She was around someone during New Years day whose friend was tested positive. She is also having diarrhea. She also has muscle pain.   Cough This is a new problem. The current episode started in the past 7 days. The problem has been unchanged. The problem occurs every few minutes. The cough is non-productive. Associated symptoms include chills, a fever, headaches, nasal congestion, shortness of breath and sweats. Pertinent negatives include no wheezing. The treatment provided mild relief.     Past Medical History:  Diagnosis Date  . Anemia    3 months ago anemic  . Anxiety    on meds  . Arthritis    "all over" (01/15/2017)  . Asthma   . Bronchitis with emphysema   . Chest pain   . Chronic bronchitis ([place])   . Coronary artery disease    a. 01/2017 she underwent orbital atherectomy/DES to the proxmal LAD and PTCA to ostial D2. 2D Echo 01/15/17 showed mild LVH, EF 60-65%, grade 1 DD.  . Family history of anesthesia complication     daughter N/V  . Fibromyalgia   . GERD (gastroesophageal reflux disease)    on meds  . Glaucoma   . Heart murmur   . History of hiatal hernia   . Hx of echocardiogram    Echo (03/2013):  Tech limited; Mild focal basal septal hypertrophy, EF 60-65%, normal RVF  . Hyperlipidemia   . Hypertension   . Nonsustained ventricular tachycardia ([place])   . OSA on CPAP   . Premature atrial contractions   . PVC (premature ventricular contraction)    a. Holter 12/16: NSR, occ PAC,PVCs  . Sarcoidosis   . Type II diabetes mellitus (H[facility])      Family History  Problem Relation Age of Onset  . Heart disease Father   . Diabetes Father   . Glaucoma Father   . Cancer Mother        unknown type, ?lung  . Heart disease Paternal Grandmother   . Cancer Paternal Grandmother        unknown type  . Diabetes Brother   . Glaucoma Brother      Current Outpatient Medications:  .  acetaminophen (TYLENOL) 500 MG tablet, Take 1,000 mg by mouth 2 (two) times daily as needed for moderate pain or headache., Disp: , Rfl:  .  albuterol (PROVENTIL) (2.5 MG/3ML) 0.083% nebulizer solution, Take 3 mLs (2.5 mg total) by nebulization daily as needed for wheezing or shortness of breath., Disp: 75 mL, Rfl:  12 .  albuterol (VENTOLIN HFA) 108 (90 Base) MCG/ACT inhaler, Inhale 2 puffs into the lungs every 6 (six) hours as needed for wheezing or shortness of breath., Disp: 18 g, Rfl: 12 .  aspirin EC 81 MG tablet, Take 81 mg by mouth daily. Swallow whole., Disp: , Rfl:  .  atorvastatin (LIPITOR) 80 MG tablet, TAKE 1 TABLET(80 MG) BY MOUTH DAILY (Patient taking differently: Take 80 mg by mouth at bedtime.), Disp: 90 tablet, Rfl: 2 .  azithromycin (ZITHROMAX) 250 MG tablet, Take 2 tablets (500 mg) on  Day 1,  followed by 1 tablet (250 mg) once daily on Days 2 through 5., Disp: 6 each, Rfl: 0 .  Blood Glucose Monitoring Suppl (ACCU-CHEK AVIVA PLUS) w/Device KIT, Use to check blood sugars 3 times a day. Dx code e11.65, Disp: 1 kit,  Rfl: 3 .  brimonidine (ALPHAGAN P) 0.1 % SOLN, Place 1 drop into both eyes 2 (two) times daily. , Disp: , Rfl:  .  brinzolamide (AZOPT) 1 % ophthalmic suspension, Place 1 drop into both eyes 3 (three) times daily., Disp: , Rfl:  .  carvedilol (COREG) 12.5 MG tablet, TAKE 1 TABLET(12.5 MG) BY MOUTH TWICE DAILY (Patient taking differently: Take 12.5 mg by mouth in the morning and at bedtime.), Disp: 180 tablet, Rfl: 3 .  clopidogrel (PLAVIX) 75 MG tablet, Take 1 tablet (75 mg total) by mouth daily., Disp: 90 tablet, Rfl: 3 .  Fluticasone-Umeclidin-Vilant (TRELEGY ELLIPTA) 100-62.5-25 MCG/INH AEPB, Inhale 1 puff into the lungs daily., Disp: 60 each, Rfl: 5 .  furosemide (LASIX) 40 MG tablet, Take 1 tablet (40 mg total) by mouth every other day. (Patient taking differently: Take 40 mg by mouth every 3 (three) days.), Disp: 90 tablet, Rfl: 1 .  isosorbide mononitrate (IMDUR) 60 MG 24 hr tablet, TAKE 1 TABLET(60 MG) BY MOUTH DAILY (Patient taking differently: Take 60 mg by mouth daily.), Disp: 90 tablet, Rfl: 3 .  JANUMET 50-500 MG tablet, TAKE 1 TABLET BY MOUTH TWICE DAILY WITH A MEAL (Patient taking differently: Take 1 tablet by mouth in the morning and at bedtime.), Disp: 180 tablet, Rfl: 1 .  Lancets (ONETOUCH DELICA PLUS [id number]) MISC, CHECK BLOOD SUGAR BEFORE BREAKFAST AND DINNER, Disp: 100 each, Rfl: 1 .  Misc Natural Products (FOCUSED MIND PO), Take 4 tablets by mouth daily. For memory, Disp: , Rfl:  .  montelukast (SINGULAIR) 10 MG tablet, TAKE 1 TABLET(10 MG) BY MOUTH EVERY EVENING (Patient taking differently: Take 10 mg by mouth at bedtime.), Disp: 90 tablet, Rfl: 1 .  Nebulizers (COMPRESSOR/NEBULIZER) MISC, Use as directed, Disp: 1 each, Rfl: 0 .  Polyvinyl Alcohol-Povidone PF (REFRESH) 1.4-0.6 % SOLN, Place 1-2 drops into both eyes 3 (three) times daily as needed (dry/irritated eyes.)., Disp: , Rfl:  .  potassium chloride SA (KLOR-CON) 20 MEQ tablet, Take 1 tablet (20 mEq total) by mouth every  other day. (Patient taking differently: Take 20 mEq by mouth every 3 (three) days.), Disp: 90 tablet, Rfl: 1 .  prednisoLONE acetate (PRED FORTE) 1 % ophthalmic suspension, Place 1 drop into both eyes 4 (four) times daily. , Disp: , Rfl:  .  pregabalin (LYRICA) 75 MG capsule, TAKE 1 CAPSULE(75 MG) BY MOUTH TWICE DAILY, Disp: 180 capsule, Rfl: 1 .  Travoprost, BAK Free, (TRAVATAN) 0.004 % SOLN ophthalmic solution, Place 1 drop into the right eye at bedtime. , Disp: , Rfl:  .  nitroGLYCERIN (NITROSTAT) 0.4 MG SL tablet, Place 1 tablet (0.4 mg total) under  the tongue every 5 (five) minutes as needed for chest pain. (Patient not taking: Reported on 02/17/2020), Disp: 25 tablet, Rfl: prn .  pantoprazole (PROTONIX) 40 MG tablet, TAKE 1 TABLET(40 MG) BY MOUTH DAILY (Patient taking differently: Take 40 mg by mouth daily before breakfast. ), Disp: 30 tablet, Rfl: 11   Allergies  Allergen Reactions  . Crestor [Rosuvastatin Calcium] Other (See Comments)    muscle aches  . Demerol  [Meperidine Hcl]     Other reaction(s): Hallucinations  . Shellfish Allergy Itching and Other (See Comments)    Crab, shrimp and lobster ---lips itch and tingle Was told not to eat again after having a allergy test. Lobster, crab and shrimp      Review of Systems  Constitutional: Positive for chills, fatigue and fever.  Respiratory: Positive for cough and shortness of breath. Negative for wheezing.   Musculoskeletal: Positive for back pain.  Neurological: Positive for weakness and headaches. Negative for numbness.     Today's Vitals   02/17/20 1233  BP: 118/70  Pulse: 80  Temp: 97.9 F (36.6 C)  Weight: 173 lb 12.8 oz (78.8 kg)  Height: _0  (1.575 m)   Body mass index is 31.79 kg/m.   Objective:  Physical Exam Constitutional:      Appearance: Normal appearance. She is obese. She is ill-appearing.  HENT:     Head: Normocephalic and atraumatic.     Nose:     Comments: Deferred. Patient masked.      Mouth/Throat:     Comments: Deferred. Patient masked.  Cardiovascular:     Rate and Rhythm: Normal rate and regular rhythm.     Pulses: Normal pulses.     Heart sounds: Normal heart sounds.  Pulmonary:     Effort: Pulmonary effort is normal. No respiratory distress.     Breath sounds: Normal breath sounds. No wheezing.  Skin:    Capillary Refill: Capillary refill takes less than 2 seconds.  Neurological:     Mental Status: She is alert.  Psychiatric:        Mood and Affect: Mood normal.        Behavior: Behavior normal.        Thought Content: Thought content normal.        Judgment: Judgment normal.         Assessment And Plan:     1. Exposure to COVID-19 virus - Novel Coronavirus, NAA ([facility]) - POC COVID-19 - azithromycin (ZITHROMAX) 250 MG tablet; Take 2 tablets (500 mg) on  Day 1,  followed by 1 tablet (250 mg) once daily on Days 2 through 5.  Dispense: 6 each; Refill: 0 -Advised patient to take Vitamin C, D, Zinc. Take baby aspirin daily. Keep yourself hydrated with a lot of water and rest. Take Delsym for cough and Mucinex. Take Tylenol or pain reliever every 4-6 hours as needed for pain/fever/body ache. Educated patient if symptoms get worse or if she experiences any SOB or pain in her legs to seek immediate emergency care. Continue to monitor your pulse oxygen. Call us if you have any questions. Quarantine for 5 days if tested positive and no symptoms or 10 days if tested positive and have symptoms. Wear a mask around other people.  -Take albuterol inhaler as needed.  -If patient is positive will send her referral for monoclonal antibodies as she would benefit due to her age and comorbidities.   2. Cough - Novel Coronavirus, NAA ([facility]) - POC COVID-19 -OTC delsym or  Coricidin for HBP  -Use inhaler as needed - azithromycin (ZITHROMAX) 250 MG tablet; Take 2 tablets (500 mg) on  Day 1,  followed by 1 tablet (250 mg) once daily on Days 2 through 5.  Dispense: 6 each;  Refill: 0   3. Acute bilateral low back pain without sciatica -Novel Coronavirus, NAA ([facility])  -POC COVID-19  -Patient has been having back aches along with other COVID symptoms  -Advised patient to take tylenol and or ibuprofen for pain relief.      Patient was given opportunity to ask questions. Patient verbalized understanding of the plan and was able to repeat key elements of the plan. All questions were answered to their satisfaction.  Bary Castilla, NP   I, Bary Castilla, NP, have reviewed all documentation for this visit. The documentation on 02/17/20 for the exam, diagnosis, procedures, and orders are all accurate and complete.  THE PATIENT IS ENCOURAGED TO PRACTICE SOCIAL DISTANCING DUE TO THE COVID-19 PANDEMIC.

## 2020-02-20 LAB — NOVEL CORONAVIRUS, NAA: SARS-CoV-2, NAA: NOT DETECTED

## 2020-02-21 ENCOUNTER — Telehealth: Payer: Medicare Other

## 2020-03-18 ENCOUNTER — Other Ambulatory Visit: Payer: Self-pay | Admitting: Internal Medicine

## 2020-03-23 ENCOUNTER — Encounter: Payer: Self-pay | Admitting: Internal Medicine

## 2020-03-23 ENCOUNTER — Ambulatory Visit: Payer: Medicare Other | Admitting: Physician Assistant

## 2020-03-23 NOTE — Progress Notes (Deleted)
Cardiology Office Note:    Date:  03/23/2020   ID:  Sheila Reynolds, DOB [birth year], MRN [id number]  PCP:  Glendale Chard, MD  [facility] Cardiologist:  Mertie Moores, MD  [facility] Electrophysiologist:  None   Chief Complaint: 54-month follow-up  History of Present Illness:    Sheila Reynolds is a 75 y.o. female with a hx of CAD s/p orbital atherectomy and DES to proximal/mid LAD, nonsustained VT, sarcoidosis followed by Dr. Annamaria Boots, venous insufficiency, hypertension, hyperlipidemia, sleep apnea on CPAP, diabetes mellitus and emphysema seen for 61-months follow-up.  Last seen by Dr. Acie Fredrickson November 2021.  Patient was having chest tightness felt atypical for angina.  Also had palpitation.  Stress test 12/20/2019 was low risk.  Monitor showed rare PVC without evidence of ventricular tachycardia.  Here today for follow-up.   Past Medical History:  Diagnosis Date  . Anemia    3 months ago anemic  . Anxiety    on meds  . Arthritis    "all over" (01/15/2017)  . Asthma   . Bronchitis with emphysema   . Chest pain   . Chronic bronchitis ([place])   . Coronary artery disease    a. 01/2017 she underwent orbital atherectomy/DES to the proxmal LAD and PTCA to ostial D2. 2D Echo 01/15/17 showed mild LVH, EF 60-65%, grade 1 DD.  . Family history of anesthesia complication    daughter N/V  . Fibromyalgia   . GERD (gastroesophageal reflux disease)    on meds  . Glaucoma   . Heart murmur   . History of hiatal hernia   . Hx of echocardiogram    Echo (03/2013):  Tech limited; Mild focal basal septal hypertrophy, EF 60-65%, normal RVF  . Hyperlipidemia   . Hypertension   . Nonsustained ventricular tachycardia ([place])   . OSA on CPAP   . Premature atrial contractions   . PVC (premature ventricular contraction)    a. Holter 12/16: NSR, occ PAC,PVCs  . Sarcoidosis   . Type II diabetes mellitus ([place])     Past Surgical History:  Procedure Laterality Date  . BREAST SURGERY    .  CARDIAC CATHETERIZATION  05/04/2007   reveals overall normal left ventricular systolic function. Ejection fraction 65-70%  . CATARACT EXTRACTION W/PHACO Right 07/20/2013   Procedure: CATARACT EXTRACTION PHACO AND INTRAOCULAR LENS PLACEMENT (IOC);  Surgeon: Marylynn Pearson, MD;  Location: [place];  Service: Ophthalmology;  Laterality: Right;  . COLONOSCOPY W/ BIOPSIES AND POLYPECTOMY    . COLONOSCOPY WITH PROPOFOL N/A 09/07/2014   Procedure: COLONOSCOPY WITH PROPOFOL;  Surgeon: Juanita Craver, MD;  Location: [facility];  Service: Endoscopy;  Laterality: N/A;  . COLONOSCOPY WITH PROPOFOL N/A 01/10/2020   Procedure: COLONOSCOPY WITH PROPOFOL;  Surgeon: Juanita Craver, MD;  Location: [facility];  Service: Endoscopy;  Laterality: N/A;  . CORONARY ANGIOPLASTY WITH STENT PLACEMENT  01/15/2017  . CORONARY ATHERECTOMY N/A 01/15/2017   Procedure: CORONARY ATHERECTOMY;  Surgeon: Nelva Bush, MD;  Location: [place] [facility];  Service: Cardiovascular;  Laterality: N/A;  . CORONARY BALLOON ANGIOPLASTY N/A 01/15/2017   Procedure: CORONARY BALLOON ANGIOPLASTY;  Surgeon: Nelva Bush, MD;  Location: [place] [facility];  Service: Cardiovascular;  Laterality: N/A;  . CORONARY STENT INTERVENTION N/A 01/15/2017   Procedure: CORONARY STENT INTERVENTION;  Surgeon: Nelva Bush, MD;  Location: [place] [facility];  Service: Cardiovascular;  Laterality: N/A;  . ESOPHAGOGASTRODUODENOSCOPY (EGD) WITH PROPOFOL N/A 09/07/2014   Procedure: ESOPHAGOGASTRODUODENOSCOPY (EGD) WITH PROPOFOL;  Surgeon: Juanita Craver,  MD;  [facility]: [facility];  Service: Endoscopy;  Laterality: N/A;  . EXTERNAL EAR SURGERY Bilateral 1970s   tumors removed  . EYE SURGERY Left 2019   cataract extraction   . EYE SURGERY Right 02/09/2018   eyelid surgery   . INTRAVASCULAR PRESSURE WIRE/FFR STUDY N/A 12/12/2016   Procedure: INTRAVASCULAR PRESSURE WIRE/FFR STUDY;  Surgeon: Nelva Bush, MD;  Location: [place] [facility];  Service:  Cardiovascular;  Laterality: N/A;  . MINI SHUNT INSERTION Right 07/20/2013   Procedure: INSERTION OF GLAUCOMA FILTRATION DEVICE RIGHT EYE;  Surgeon: Marylynn Pearson, MD;  Location: [place];  Service: Ophthalmology;  Laterality: Right;  . MITOMYCIN C APPLICATION Right 9/38/1017   Procedure: MITOMYCIN C APPLICATION;  Surgeon: Marylynn Pearson, MD;  Location: [place];  Service: Ophthalmology;  Laterality: Right;  . MITOMYCIN C APPLICATION Right 06/20/2583   Procedure: MITOMYCIN C APPLICATION RIGHT EYE;  Surgeon: Marylynn Pearson, MD;  Location: [place];  Service: Ophthalmology;  Laterality: Right;  . PLACEMENT OF BREAST IMPLANTS Bilateral 1992   "took all my breast tissue out; put implants in;fibrocystic breast disease "  . POLYPECTOMY  01/10/2020   Procedure: POLYPECTOMY;  Surgeon: Juanita Craver, MD;  [facility];  Service: Endoscopy;;  . RIGHT/LEFT HEART CATH AND CORONARY ANGIOGRAPHY N/A 12/12/2016   Procedure: RIGHT/LEFT HEART CATH AND CORONARY ANGIOGRAPHY;  Surgeon: Nelva Bush, MD;  Location: [place] [facility];  Service: Cardiovascular;  Laterality: N/A;  . TONSILLECTOMY    . TOTAL ABDOMINAL HYSTERECTOMY  1982  . TRABECULECTOMY Right 02/21/2015   Procedure: TRABECULECTOMY WITH [facility] ON THE RIGHT EYE;  Surgeon: Marylynn Pearson, MD;  Location: [place];  Service: Ophthalmology;  Laterality: Right;    Current Medications: No outpatient medications have been marked as taking for the 03/23/20 encounter (Appointment) with Leanor Kail, PA.     Allergies:   Crestor [rosuvastatin calcium], Demerol  [meperidine hcl], and Shellfish allergy   Social History   Socioeconomic History  . Marital status: Married    Spouse name: Not on file  . Number of children: 2  . Years of education: Not on file  . Highest education level: Not on file  Occupational History  . Occupation: unemployed    Fish farm manager: [employer]  Tobacco Use  . Smoking status: Former Smoker    Packs/day: 1.00    Years:  4.00    Pack years: 4.00    Types: Cigarettes    Quit date: 02/11/1980    Years since quitting: 40.1  . Smokeless tobacco: Never Used  Vaping Use  . Vaping Use: Never used  Substance and Sexual Activity  . Alcohol use: No  . Drug use: No  . Sexual activity: Yes  Other Topics Concern  . Not on file  Social History Narrative  . Not on file   Social Determinants of Health   Financial Resource Strain: Not on file  Food Insecurity: Not on file  Transportation Needs: Not on file  Physical Activity: Not on file  Stress: Not on file  Social Connections: Not on file     Family History: The patient's family history includes Cancer in her mother and paternal grandmother; Diabetes in her brother and father; Glaucoma in her brother and father; Heart disease in her father and paternal grandmother.  ***  ROS:   Please see the history of present illness.    All other systems reviewed and are negative.   EKGs/Labs/Other Studies Reviewed:    The following studies were reviewed today: Monitor  12/2019  Sinus rhythm  Rare PVCs  No evidence of ventricular tachycardia  Stress test 12/2019  Nuclear stress EF: 67%.  There was no ST segment deviation noted during stress.  The study is normal.  This is a low risk study.  The left ventricular ejection fraction is hyperdynamic (>65%).  EKG:  EKG is *** ordered today.  The ekg ordered today demonstrates ***  Recent Labs: 08/02/2019: Hemoglobin 11.4; Platelets 223 11/28/2019: BNP 19.2; BUN 11; Creatinine, Ser 0.84; Magnesium 1.7; Potassium 5.0; Sodium 142; TSH 1.130  Recent Lipid Panel    Component Value Date/Time   CHOL 133 03/23/2019 1141   TRIG 102 03/23/2019 1141   HDL 41 03/23/2019 1141   CHOLHDL 3.2 03/23/2019 1141   CHOLHDL 3 05/24/2014 1650   VLDL 20.8 05/24/2014 1650   LDLCALC 73 03/23/2019 1141   Physical Exam:    VS:  There were no vitals taken for this visit.    Wt Readings from Last 3 Encounters:  02/17/20 173  lb 12.8 oz (78.8 kg)  12/30/19 178 lb (80.7 kg)  12/20/19 178 lb (80.7 kg)     GEN: *** Well nourished, well developed in no acute distress HEENT: Normal NECK: No JVD; No carotid bruits LYMPHATICS: No lymphadenopathy CARDIAC: ***RRR, no murmurs, rubs, gallops RESPIRATORY:  Clear to auscultation without rales, wheezing or rhonchi  ABDOMEN: Soft, non-tender, non-distended MUSCULOSKELETAL:  No edema; No deformity  SKIN: Warm and dry NEUROLOGIC:  Alert and oriented x 3 PSYCHIATRIC:  Normal affect   ASSESSMENT AND PLAN:    1. ***  2. ***  Medication Adjustments/Labs and Tests Ordered: Current medicines are reviewed at length with the patient today.  Concerns regarding medicines are outlined above.  No orders of the defined types were placed in this encounter.  No orders of the defined types were placed in this encounter.   There are no Patient Instructions on file for this visit.   Jarrett Soho, [place]  03/23/2020 10:19 AM    [place]

## 2020-03-26 DIAGNOSIS — H401122 Primary open-angle glaucoma, left eye, moderate stage: Secondary | ICD-10-CM | POA: Diagnosis not present

## 2020-03-26 DIAGNOSIS — H401113 Primary open-angle glaucoma, right eye, severe stage: Secondary | ICD-10-CM | POA: Diagnosis not present

## 2020-03-28 ENCOUNTER — Other Ambulatory Visit: Payer: Self-pay

## 2020-03-28 ENCOUNTER — Ambulatory Visit (INDEPENDENT_AMBULATORY_CARE_PROVIDER_SITE_OTHER): Payer: Medicare Other

## 2020-03-28 ENCOUNTER — Ambulatory Visit (INDEPENDENT_AMBULATORY_CARE_PROVIDER_SITE_OTHER): Payer: Medicare Other | Admitting: Internal Medicine

## 2020-03-28 ENCOUNTER — Encounter: Payer: Self-pay | Admitting: Internal Medicine

## 2020-03-28 VITALS — BP 122/80 | HR 77 | Temp 97.8°F | Ht 62.0 in | Wt 177.0 lb

## 2020-03-28 DIAGNOSIS — Z79899 Other long term (current) drug therapy: Secondary | ICD-10-CM | POA: Diagnosis not present

## 2020-03-28 DIAGNOSIS — E119 Type 2 diabetes mellitus without complications: Secondary | ICD-10-CM

## 2020-03-28 DIAGNOSIS — I472 Ventricular tachycardia: Secondary | ICD-10-CM

## 2020-03-28 DIAGNOSIS — I5032 Chronic diastolic (congestive) heart failure: Secondary | ICD-10-CM

## 2020-03-28 DIAGNOSIS — I25119 Atherosclerotic heart disease of native coronary artery with unspecified angina pectoris: Secondary | ICD-10-CM | POA: Diagnosis not present

## 2020-03-28 DIAGNOSIS — E1165 Type 2 diabetes mellitus with hyperglycemia: Secondary | ICD-10-CM | POA: Diagnosis not present

## 2020-03-28 DIAGNOSIS — I700 Atherosclerosis of aorta: Secondary | ICD-10-CM

## 2020-03-28 DIAGNOSIS — I110 Hypertensive heart disease with heart failure: Secondary | ICD-10-CM | POA: Diagnosis not present

## 2020-03-28 DIAGNOSIS — Z0000 Encounter for general adult medical examination without abnormal findings: Secondary | ICD-10-CM

## 2020-03-28 DIAGNOSIS — I4729 Other ventricular tachycardia: Secondary | ICD-10-CM

## 2020-03-28 DIAGNOSIS — I10 Essential (primary) hypertension: Secondary | ICD-10-CM

## 2020-03-28 LAB — POCT URINALYSIS DIPSTICK
Bilirubin, UA: NEGATIVE
Glucose, UA: NEGATIVE
Ketones, UA: NEGATIVE
Nitrite, UA: NEGATIVE
Protein, UA: NEGATIVE
Spec Grav, UA: 1.02 (ref 1.010–1.025)
Urobilinogen, UA: 0.2 E.U./dL
pH, UA: 6 (ref 5.0–8.0)

## 2020-03-28 LAB — POCT UA - MICROALBUMIN
Albumin/Creatinine Ratio, Urine, POC: <30
Creatinine, POC: 200 mg/dL
Microalbumin Ur, POC: 30 mg/L

## 2020-03-28 NOTE — Addendum Note (Signed)
Addended by: Glenna Durand E on: 03/28/2020 12:14 PM   Modules accepted: Orders

## 2020-03-28 NOTE — Progress Notes (Signed)
I,Katawbba Wiggins,acting as a Education administrator for Maximino Greenland, MD.,have documented all relevant documentation on the behalf of Maximino Greenland, MD,as directed by  Maximino Greenland, MD while in the presence of Maximino Greenland, MD.  This visit occurred during the SARS-CoV-2 public health emergency.  Safety protocols were in place, including screening questions prior to the visit, additional usage of staff PPE, and extensive cleaning of exam room while observing appropriate contact time as indicated for disinfecting solutions.  Subjective:     Patient ID: Sheila Reynolds , female    DOB: 1945-07-27 , 75 y.o.   MRN: [id number]   Chief Complaint  Patient presents with  . Diabetes  . Hypertension    HPI  She presents today for DM/HTN check. She reports compliance with meds. She is also scheduled for AWV with [facility] Advisor today. She denies headaches, palpitations and shortness of breath. Admittedly, she does not exercise regularly.   Diabetes She presents for her follow-up diabetic visit. She has type 2 diabetes mellitus. Her disease course has been stable. There are no hypoglycemic associated symptoms. Pertinent negatives for diabetes include no blurred vision and no chest pain. There are no hypoglycemic complications. Diabetic complications include heart disease. Her weight is stable. Her breakfast blood glucose is taken between 7-8 am. Her breakfast blood glucose range is generally 90-110 mg/dl. An ACE inhibitor/angiotensin II receptor blocker is being taken. Eye exam is current.  Hypertension This is a chronic problem. The current episode started more than 1 year ago. The problem has been gradually improving since onset. The problem is controlled. Pertinent negatives include no blurred vision, chest pain or palpitations. Risk factors for coronary artery disease include diabetes mellitus, dyslipidemia, obesity, post-menopausal state and sedentary lifestyle.     Past Medical History:  Diagnosis  Date  . Anemia    3 months ago anemic  . Anxiety    on meds  . Arthritis    "all over" (01/15/2017)  . Asthma   . Bronchitis with emphysema   . Chest pain   . Chronic bronchitis ([place])   . Coronary artery disease    a. 01/2017 she underwent orbital atherectomy/DES to the proxmal LAD and PTCA to ostial D2. 2D Echo 01/15/17 showed mild LVH, EF 60-65%, grade 1 DD.  . Family history of anesthesia complication    daughter N/V  . Fibromyalgia   . GERD (gastroesophageal reflux disease)    on meds  . Glaucoma   . Heart murmur   . History of hiatal hernia   . Hx of echocardiogram    Echo (03/2013):  Tech limited; Mild focal basal septal hypertrophy, EF 60-65%, normal RVF  . Hyperlipidemia   . Hypertension   . Nonsustained ventricular tachycardia ([place])   . OSA on CPAP   . Premature atrial contractions   . PVC (premature ventricular contraction)    a. Holter 12/16: NSR, occ PAC,PVCs  . Sarcoidosis   . Type II diabetes mellitus (HCC)      Family History  Problem Relation Age of Onset  . Heart disease Father   . Diabetes Father   . Glaucoma Father   . Cancer Mother        unknown type, ?lung  . Heart disease Paternal Grandmother   . Cancer Paternal Grandmother        unknown type  . Diabetes Brother   . Glaucoma Brother      Current Outpatient Medications:  .  acetaminophen (TYLENOL) 500 MG tablet,  Take 1,000 mg by mouth 2 (two) times daily as needed for moderate pain or headache., Disp: , Rfl:  .  albuterol (PROVENTIL) (2.5 MG/3ML) 0.083% nebulizer solution, Take 3 mLs (2.5 mg total) by nebulization daily as needed for wheezing or shortness of breath., Disp: 75 mL, Rfl: 12 .  albuterol (VENTOLIN HFA) 108 (90 Base) MCG/ACT inhaler, Inhale 2 puffs into the lungs every 6 (six) hours as needed for wheezing or shortness of breath., Disp: 18 g, Rfl: 12 .  ALPRAZolam (XANAX) 0.5 MG tablet, For sedation before MRI scan; take 1 tab 1 hour before scan; may repeat 1 tab 15 min before scan,  Disp: 2 tablet, Rfl: 0 .  aspirin EC 81 MG tablet, Take 81 mg by mouth daily. Swallow whole., Disp: , Rfl:  .  atorvastatin (LIPITOR) 80 MG tablet, TAKE 1 TABLET(80 MG) BY MOUTH DAILY (Patient taking differently: Take 80 mg by mouth at bedtime.), Disp: 90 tablet, Rfl: 2 .  Blood Glucose Monitoring Suppl (ACCU-CHEK AVIVA PLUS) w/Device KIT, Use to check blood sugars 3 times a day. Dx code e11.65, Disp: 1 kit, Rfl: 3 .  brimonidine (ALPHAGAN P) 0.1 % SOLN, Place 1 drop into both eyes 2 (two) times daily. , Disp: , Rfl:  .  brinzolamide (AZOPT) 1 % ophthalmic suspension, Place 1 drop into both eyes 3 (three) times daily., Disp: , Rfl:  .  carvedilol (COREG) 12.5 MG tablet, TAKE 1 TABLET(12.5 MG) BY MOUTH TWICE DAILY (Patient taking differently: Take 12.5 mg by mouth in the morning and at bedtime.), Disp: 180 tablet, Rfl: 3 .  clopidogrel (PLAVIX) 75 MG tablet, Take 1 tablet (75 mg total) by mouth daily., Disp: 90 tablet, Rfl: 3 .  Fluticasone-Umeclidin-Vilant (TRELEGY ELLIPTA) 100-62.5-25 MCG/INH AEPB, Inhale 1 puff into the lungs daily. (Patient not taking: Reported on 04/13/2020), Disp: 60 each, Rfl: 5 .  furosemide (LASIX) 40 MG tablet, Take 1 tablet (40 mg total) by mouth every other day. (Patient taking differently: Take 40 mg by mouth every 3 (three) days.), Disp: 90 tablet, Rfl: 1 .  isosorbide mononitrate (IMDUR) 60 MG 24 hr tablet, TAKE 1 TABLET(60 MG) BY MOUTH DAILY (Patient taking differently: Take 60 mg by mouth daily.), Disp: 90 tablet, Rfl: 3 .  JANUMET 50-500 MG tablet, TAKE 1 TABLET BY MOUTH TWICE DAILY WITH A MEAL, Disp: 180 tablet, Rfl: 1 .  Lancets (ONETOUCH DELICA PLUS [id number]) MISC, CHECK BLOOD SUGAR BEFORE BREAKFAST AND DINNER, Disp: 100 each, Rfl: 1 .  Misc Natural Products (FOCUSED MIND PO), Take 4 tablets by mouth daily. For memory, Disp: , Rfl:  .  montelukast (SINGULAIR) 10 MG tablet, TAKE 1 TABLET(10 MG) BY MOUTH EVERY EVENING (Patient taking differently: Take 10 mg by mouth at  bedtime.), Disp: 90 tablet, Rfl: 1 .  Nebulizers (COMPRESSOR/NEBULIZER) MISC, Use as directed, Disp: 1 each, Rfl: 0 .  nitroGLYCERIN (NITROSTAT) 0.4 MG SL tablet, Place 1 tablet (0.4 mg total) under the tongue every 5 (five) minutes as needed for chest pain., Disp: 25 tablet, Rfl: prn .  pantoprazole (PROTONIX) 40 MG tablet, TAKE 1 TABLET(40 MG) BY MOUTH DAILY (Patient taking differently: Take 40 mg by mouth daily before breakfast.), Disp: 30 tablet, Rfl: 11 .  Polyvinyl Alcohol-Povidone PF (REFRESH) 1.4-0.6 % SOLN, Place 1-2 drops into both eyes 3 (three) times daily as needed (dry/irritated eyes.)., Disp: , Rfl:  .  potassium chloride SA (KLOR-CON) 20 MEQ tablet, Take 1 tablet (20 mEq total) by mouth every other day. (Patient taking  differently: Take 20 mEq by mouth every 3 (three) days.), Disp: 90 tablet, Rfl: 1 .  prednisoLONE acetate (PRED FORTE) 1 % ophthalmic suspension, Place 1 drop into both eyes 4 (four) times daily. , Disp: , Rfl:  .  pregabalin (LYRICA) 75 MG capsule, TAKE 1 CAPSULE(75 MG) BY MOUTH TWICE DAILY, Disp: 180 capsule, Rfl: 1 .  Travoprost, BAK Free, (TRAVATAN) 0.004 % SOLN ophthalmic solution, Place 1 drop into the right eye at bedtime. , Disp: , Rfl:    Allergies  Allergen Reactions  . Crestor [Rosuvastatin Calcium] Other (See Comments)    muscle aches  . Demerol  [Meperidine Hcl]     Other reaction(s): Hallucinations  . Shellfish Allergy Itching and Other (See Comments)    Crab, shrimp and lobster ---lips itch and tingle Was told not to eat again after having a allergy test. Lobster, crab and shrimp      Review of Systems  Constitutional: Negative.   Eyes: Negative for blurred vision.  Respiratory: Negative.   Cardiovascular: Negative for chest pain and palpitations.  Gastrointestinal: Negative.   Psychiatric/Behavioral: Negative.   All other systems reviewed and are negative.    Today's Vitals   03/28/20 1048  BP: 122/80  Pulse: 77  Temp: 97.8 F (36.6  C)  TempSrc: Oral  Weight: 177 lb (80.3 kg)  Height: 5' 2" (1.575 m)  PainSc: 5   PainLoc: Back   Body mass index is 32.37 kg/m.   Objective:  Physical Exam Vitals and nursing note reviewed.  Constitutional:      Appearance: Normal appearance.  HENT:     Head: Normocephalic and atraumatic.     Nose:     Comments: Masked     Mouth/Throat:     Comments: Masked  Cardiovascular:     Rate and Rhythm: Normal rate and regular rhythm.     Heart sounds: Normal heart sounds.  Pulmonary:     Effort: Pulmonary effort is normal.     Breath sounds: Normal breath sounds.  Musculoskeletal:     Cervical back: Normal range of motion.     Right lower leg: Edema present.     Left lower leg: Edema present.  Skin:    General: Skin is warm.  Neurological:     General: No focal deficit present.     Mental Status: She is alert and oriented to person, place, and time.         Assessment And Plan:     1. Uncontrolled type 2 diabetes mellitus with hyperglycemia (HCC) Comments: Chronic, I will check labs as listed below. I will adjust meds as needed. Encouraged to avoid sugary beverages, including diet.  - Hemoglobin A1c - BMP8+EGFR  2. 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 heart failure ([place]) Comments: Chronic, well controlled. She is encouraged to continue with low sodium diet.   3. NSVT (nonsustained ventricular tachycardia) ([place]) Comments: She is also followed by Cardiology. Advised to consider magnesium supplementation.   4. Atherosclerosis of aorta ([place]) Comments: Chronic, encouraged to follow heart healthy lifestyle. Avoid fried foods, increase daily activity, increase fiber and comply with statin therapy.   5. Atherosclerosis of native coronary artery with angina pectoris, unspecified whether native or transplanted heart [facility]) Comments: Please see above.   6. Drug therapy - Vitamin B12   Patient was given opportunity to ask questions. Patient  verbalized understanding of the plan and was able to repeat key elements of the plan. All questions were answered to their satisfaction.  I, Maximino Greenland, MD, have reviewed all documentation for this visit. The documentation on 04/16/20 for the exam, diagnosis, procedures, and orders are all accurate and complete.  THE PATIENT IS ENCOURAGED TO PRACTICE SOCIAL DISTANCING DUE TO THE COVID-19 PANDEMIC.

## 2020-03-28 NOTE — Patient Instructions (Signed)
Sheila Reynolds , Thank you for taking time to come for your Medicare Wellness Visit. I appreciate your ongoing commitment to your health goals. Please review the following plan we discussed and let me know if I can assist you in the future.   Screening recommendations/referrals: Colonoscopy: not required Mammogram: requesting report Bone Density: completed 12/15/2013 Recommended yearly ophthalmology/optometry visit for glaucoma screening and checkup Recommended yearly dental visit for hygiene and checkup  Vaccinations: Influenza vaccine: completed 12/30/2019, due 09/10/2020 Pneumococcal vaccine: due Tdap vaccine: completed 11/09/2012, due 11/10/2022 Shingles vaccine: needs second dose   Covid-19: 11/22/2019, 03/22/2019, 03/01/2019  Advanced directives: Please bring a copy of your POA (Power of Attorney) and/or Living Will to your next appointment.   Conditions/risks identified: none  Next appointment: Follow up in one year for your annual wellness visit    Preventive Care 65 Years and Older, Female Preventive care refers to lifestyle choices and visits with your health care provider that can promote health and wellness. What does preventive care include?  A yearly physical exam. This is also called an annual well check.  Dental exams once or twice a year.  Routine eye exams. Ask your health care provider how often you should have your eyes checked.  Personal lifestyle choices, including:  Daily care of your teeth and gums.  Regular physical activity.  Eating a healthy diet.  Avoiding tobacco and drug use.  Limiting alcohol use.  Practicing safe sex.  Taking low-dose aspirin every day.  Taking vitamin and mineral supplements as recommended by your health care provider. What happens during an annual well check? The services and screenings done by your health care provider during your annual well check will depend on your age, overall health, lifestyle risk factors, and family  history of disease. Counseling  Your health care provider may ask you questions about your:  Alcohol use.  Tobacco use.  Drug use.  Emotional well-being.  Home and relationship well-being.  Sexual activity.  Eating habits.  History of falls.  Memory and ability to understand (cognition).  Work and work Statistician.  Reproductive health. Screening  You may have the following tests or measurements:  Height, weight, and BMI.  Blood pressure.  Lipid and cholesterol levels. These may be checked every 5 years, or more frequently if you are over 86 years old.  Skin check.  Lung cancer screening. You may have this screening every year starting at age 52 if you have a 30-pack-year history of smoking and currently smoke or have quit within the past 15 years.  Fecal occult blood test (FOBT) of the stool. You may have this test every year starting at age 72.  Flexible sigmoidoscopy or colonoscopy. You may have a sigmoidoscopy every 5 years or a colonoscopy every 10 years starting at age 14.  Hepatitis C blood test.  Hepatitis B blood test.  Sexually transmitted disease (STD) testing.  Diabetes screening. This is done by checking your blood sugar (glucose) after you have not eaten for a while (fasting). You may have this done every 1-3 years.  Bone density scan. This is done to screen for osteoporosis. You may have this done starting at age 16.  Mammogram. This may be done every 1-2 years. Talk to your health care provider about how often you should have regular mammograms. Talk with your health care provider about your test results, treatment options, and if necessary, the need for more tests. Vaccines  Your health care provider may recommend certain vaccines, such as:  Influenza vaccine. This is recommended every year.  Tetanus, diphtheria, and acellular pertussis (Tdap, Td) vaccine. You may need a Td booster every 10 years.  Zoster vaccine. You may need this after  age 76.  Pneumococcal 13-valent conjugate (PCV13) vaccine. One dose is recommended after age 1.  Pneumococcal polysaccharide (PPSV23) vaccine. One dose is recommended after age 64. Talk to your health care provider about which screenings and vaccines you need and how often you need them. This information is not intended to replace advice given to you by your health care provider. Make sure you discuss any questions you have with your health care provider. Document Released: 02/23/2015 Document Revised: 10/17/2015 Document Reviewed: 11/28/2014 Elsevier Interactive Patient Education  2017 [employer] Prevention in the Home Falls can cause injuries. They can happen to people of all ages. There are many things you can do to make your home safe and to help prevent falls. What can I do on the outside of my home?  Regularly fix the edges of walkways and driveways and fix any cracks.  Remove anything that might make you trip as you walk through a door, such as a raised step or threshold.  Trim any bushes or trees on the path to your home.  Use bright outdoor lighting.  Clear any walking paths of anything that might make someone trip, such as rocks or tools.  Regularly check to see if handrails are loose or broken. Make sure that both sides of any steps have handrails.  Any raised decks and porches should have guardrails on the edges.  Have any leaves, snow, or ice cleared regularly.  Use sand or salt on walking paths during winter.  Clean up any spills in your garage right away. This includes oil or grease spills. What can I do in the bathroom?  Use night lights.  Install grab bars by the toilet and in the tub and shower. Do not use towel bars as grab bars.  Use non-skid mats or decals in the tub or shower.  If you need to sit down in the shower, use a plastic, non-slip stool.  Keep the floor dry. Clean up any water that spills on the floor as soon as it  happens.  Remove soap buildup in the tub or shower regularly.  Attach bath mats securely with double-sided non-slip rug tape.  Do not have throw rugs and other things on the floor that can make you trip. What can I do in the bedroom?  Use night lights.  Make sure that you have a light by your bed that is easy to reach.  Do not use any sheets or blankets that are too big for your bed. They should not hang down onto the floor.  Have a firm chair that has side arms. You can use this for support while you get dressed.  Do not have throw rugs and other things on the floor that can make you trip. What can I do in the kitchen?  Clean up any spills right away.  Avoid walking on wet floors.  Keep items that you use a lot in easy-to-reach places.  If you need to reach something above you, use a strong step stool that has a grab bar.  Keep electrical cords out of the way.  Do not use floor polish or wax that makes floors slippery. If you must use wax, use non-skid floor wax.  Do not have throw rugs and other things on the floor that  can make you trip. What can I do with my stairs?  Do not leave any items on the stairs.  Make sure that there are handrails on both sides of the stairs and use them. Fix handrails that are broken or loose. Make sure that handrails are as long as the stairways.  Check any carpeting to make sure that it is firmly attached to the stairs. Fix any carpet that is loose or worn.  Avoid having throw rugs at the top or bottom of the stairs. If you do have throw rugs, attach them to the floor with carpet tape.  Make sure that you have a light switch at the top of the stairs and the bottom of the stairs. If you do not have them, ask someone to add them for you. What else can I do to help prevent falls?  Wear shoes that:  Do not have high heels.  Have rubber bottoms.  Are comfortable and fit you well.  Are closed at the toe. Do not wear sandals.  If you  use a stepladder:  Make sure that it is fully opened. Do not climb a closed stepladder.  Make sure that both sides of the stepladder are locked into place.  Ask someone to hold it for you, if possible.  Clearly mark and make sure that you can see:  Any grab bars or handrails.  First and last steps.  Where the edge of each step is.  Use tools that help you move around (mobility aids) if they are needed. These include:  Canes.  Walkers.  Scooters.  Crutches.  Turn on the lights when you go into a dark area. Replace any light bulbs as soon as they burn out.  Set up your furniture so you have a clear path. Avoid moving your furniture around.  If any of your floors are uneven, fix them.  If there are any pets around you, be aware of where they are.  Review your medicines with your doctor. Some medicines can make you feel dizzy. This can increase your chance of falling. Ask your doctor what other things that you can do to help prevent falls. This information is not intended to replace advice given to you by your health care provider. Make sure you discuss any questions you have with your health care provider. Document Released: 11/23/2008 Document Revised: 07/05/2015 Document Reviewed: 03/03/2014 Elsevier Interactive Patient Education  2017 [employer].

## 2020-03-28 NOTE — Patient Instructions (Signed)
Diabetes Mellitus and Foot Care Foot care is an important part of your health, especially when you have diabetes. Diabetes may cause you to have problems because of poor blood flow (circulation) to your feet and legs, which can cause your skin to:  Become thinner and drier.  Break more easily.  Heal more slowly.  Peel and crack. You may also have nerve damage (neuropathy) in your legs and feet, causing decreased feeling in them. This means that you may not notice minor injuries to your feet that could lead to more serious problems. Noticing and addressing any potential problems early is the best way to prevent future foot problems. How to care for your feet Foot hygiene  Wash your feet daily with warm water and mild soap. Do not use hot water. Then, pat your feet and the areas between your toes until they are completely dry. Do not soak your feet as this can dry your skin.  Trim your toenails straight across. Do not dig under them or around the cuticle. File the edges of your nails with an emery board or nail file.  Apply a moisturizing lotion or petroleum jelly to the skin on your feet and to dry, brittle toenails. Use lotion that does not contain alcohol and is unscented. Do not apply lotion between your toes.   Shoes and socks  Wear clean socks or stockings every day. Make sure they are not too tight. Do not wear knee-high stockings since they may decrease blood flow to your legs.  Wear shoes that fit properly and have enough cushioning. Always look in your shoes before you put them on to be sure there are no objects inside.  To break in new shoes, wear them for just a few hours a day. This prevents injuries on your feet. Wounds, scrapes, corns, and calluses  Check your feet daily for blisters, cuts, bruises, sores, and redness. If you cannot see the bottom of your feet, use a mirror or ask someone for help.  Do not cut corns or calluses or try to remove them with medicine.  If you  find a minor scrape, cut, or break in the skin on your feet, keep it and the skin around it clean and dry. You may clean these areas with mild soap and water. Do not clean the area with peroxide, alcohol, or iodine.  If you have a wound, scrape, corn, or callus on your foot, look at it several times a day to make sure it is healing and not infected. Check for: ? Redness, swelling, or pain. ? Fluid or blood. ? Warmth. ? Pus or a bad smell.   General tips  Do not cross your legs. This may decrease blood flow to your feet.  Do not use heating pads or hot water bottles on your feet. They may burn your skin. If you have lost feeling in your feet or legs, you may not know this is happening until it is too late.  Protect your feet from hot and cold by wearing shoes, such as at the beach or on hot pavement.  Schedule a complete foot exam at least once a year (annually) or more often if you have foot problems. Report any cuts, sores, or bruises to your health care provider immediately. Where to find more information  American Diabetes Association: www.diabetes.org  Association of Diabetes Care & Education Specialists: www.diabeteseducator.org Contact a health care provider if:  You have a medical condition that increases your risk of infection and   you have any cuts, sores, or bruises on your feet.  You have an injury that is not healing.  You have redness on your legs or feet.  You feel burning or tingling in your legs or feet.  You have pain or cramps in your legs and feet.  Your legs or feet are numb.  Your feet always feel cold.  You have pain around any toenails. Get help right away if:  You have a wound, scrape, corn, or callus on your foot and: ? You have pain, swelling, or redness that gets worse. ? You have fluid or blood coming from the wound, scrape, corn, or callus. ? Your wound, scrape, corn, or callus feels warm to the touch. ? You have pus or a bad smell coming from  the wound, scrape, corn, or callus. ? You have a fever. ? You have a red line going up your leg. Summary  Check your feet every day for blisters, cuts, bruises, sores, and redness.  Apply a moisturizing lotion or petroleum jelly to the skin on your feet and to dry, brittle toenails.  Wear shoes that fit properly and have enough cushioning.  If you have foot problems, report any cuts, sores, or bruises to your health care provider immediately.  Schedule a complete foot exam at least once a year (annually) or more often if you have foot problems. This information is not intended to replace advice given to you by your health care provider. Make sure you discuss any questions you have with your health care provider. Document Revised: 08/18/2019 Document Reviewed: 08/18/2019 Elsevier Patient Education  2021 [employer]  

## 2020-03-28 NOTE — Progress Notes (Signed)
This visit occurred during the SARS-CoV-2 public health emergency.  Safety protocols were in place, including screening questions prior to the visit, additional usage of staff PPE, and extensive cleaning of exam room while observing appropriate contact time as indicated for disinfecting solutions.  Subjective:   Sheila Reynolds is a 75 y.o. female who presents for Medicare Annual (Subsequent) preventive examination.  Review of Systems     Cardiac Risk Factors include: advanced age (>38men, >18 women);diabetes mellitus;obesity (BMI >30kg/m2);sedentary lifestyle     Objective:    Today's Vitals   03/28/20 1008 03/28/20 1014  BP: 122/80   Pulse: 77   Temp: 97.8 F (36.6 C)   TempSrc: Oral   SpO2: 97%   Weight: 177 lb (80.3 kg)   Height: $Remove'5\' 2"'RcNxXRI$  (1.575 m)   PainSc:  5    Body mass index is 32.37 kg/m.  Advanced Directives 03/28/2020 01/10/2020 08/09/2019 03/23/2019 01/23/2019 01/01/2019 03/18/2018  Does Patient Have a Medical Advance Directive? Yes No No No No No No  Type of Paramedic of [place];Living will - - - - - -  Copy of [place] in Chart? No - copy requested - - - - - -  Would patient like information on creating a medical advance directive? - No - Patient declined No - Patient declined Yes (MAU/Ambulatory/Procedural Areas - Information given) - - Yes (MAU/Ambulatory/Procedural Areas - Information given)  Pre-existing out of facility DNR order (yellow form or pink MOST form) - - - - - - -    Current Medications (verified) Outpatient Encounter Medications as of 03/28/2020  Medication Sig  . acetaminophen (TYLENOL) 500 MG tablet Take 1,000 mg by mouth 2 (two) times daily as needed for moderate pain or headache.  . albuterol (PROVENTIL) (2.5 MG/3ML) 0.083% nebulizer solution Take 3 mLs (2.5 mg total) by nebulization daily as needed for wheezing or shortness of breath.  [name] albuterol (VENTOLIN HFA) 108 (90 Base) MCG/ACT inhaler Inhale 2  puffs into the lungs every 6 (six) hours as needed for wheezing or shortness of breath.  [name] aspirin EC 81 MG tablet Take 81 mg by mouth daily. Swallow whole.  [name] atorvastatin (LIPITOR) 80 MG tablet TAKE 1 TABLET(80 MG) BY MOUTH DAILY (Patient taking differently: Take 80 mg by mouth at bedtime.)  . Blood Glucose Monitoring Suppl (ACCU-CHEK AVIVA PLUS) w/Device KIT Use to check blood sugars 3 times a day. Dx code e11.65  . brimonidine (ALPHAGAN P) 0.1 % SOLN Place 1 drop into both eyes 2 (two) times daily.   . brinzolamide (AZOPT) 1 % ophthalmic suspension Place 1 drop into both eyes 3 (three) times daily.  . carvedilol (COREG) 12.5 MG tablet TAKE 1 TABLET(12.5 MG) BY MOUTH TWICE DAILY (Patient taking differently: Take 12.5 mg by mouth in the morning and at bedtime.)  . clopidogrel (PLAVIX) 75 MG tablet Take 1 tablet (75 mg total) by mouth daily.  . isosorbide mononitrate (IMDUR) 60 MG 24 hr tablet TAKE 1 TABLET(60 MG) BY MOUTH DAILY (Patient taking differently: Take 60 mg by mouth daily.)  . JANUMET 50-500 MG tablet TAKE 1 TABLET BY MOUTH TWICE DAILY WITH A MEAL  . Lancets (ONETOUCH DELICA PLUS [id number]) MISC CHECK BLOOD SUGAR BEFORE BREAKFAST AND DINNER  . Misc Natural Products (FOCUSED MIND PO) Take 4 tablets by mouth daily. For memory  . montelukast (SINGULAIR) 10 MG tablet TAKE 1 TABLET(10 MG) BY MOUTH EVERY EVENING (Patient taking differently: Take 10 mg by mouth at bedtime.)  .  Nebulizers (COMPRESSOR/NEBULIZER) MISC Use as directed  . pantoprazole (PROTONIX) 40 MG tablet TAKE 1 TABLET(40 MG) BY MOUTH DAILY (Patient taking differently: Take 40 mg by mouth daily before breakfast.)  . Polyvinyl Alcohol-Povidone PF (REFRESH) 1.4-0.6 % SOLN Place 1-2 drops into both eyes 3 (three) times daily as needed (dry/irritated eyes.).  [name] potassium chloride SA (KLOR-CON) 20 MEQ tablet Take 1 tablet (20 mEq total) by mouth every other day. (Patient taking differently: Take 20 mEq by mouth every 3 (three) days.)   . prednisoLONE acetate (PRED FORTE) 1 % ophthalmic suspension Place 1 drop into both eyes 4 (four) times daily.   . pregabalin (LYRICA) 75 MG capsule TAKE 1 CAPSULE(75 MG) BY MOUTH TWICE DAILY  . Travoprost, BAK Free, (TRAVATAN) 0.004 % SOLN ophthalmic solution Place 1 drop into the right eye at bedtime.   . Fluticasone-Umeclidin-Vilant (TRELEGY ELLIPTA) 100-62.5-25 MCG/INH AEPB Inhale 1 puff into the lungs daily. (Patient not taking: Reported on 03/28/2020)  . furosemide (LASIX) 40 MG tablet Take 1 tablet (40 mg total) by mouth every other day. (Patient taking differently: Take 40 mg by mouth every 3 (three) days.)  . nitroGLYCERIN (NITROSTAT) 0.4 MG SL tablet Place 1 tablet (0.4 mg total) under the tongue every 5 (five) minutes as needed for chest pain. (Patient not taking: No sig reported)   No facility-administered encounter medications on file as of 03/28/2020.    Allergies (verified) Crestor [rosuvastatin calcium], Demerol  [meperidine hcl], and Shellfish allergy   History: Past Medical History:  Diagnosis Date  . Anemia    3 months ago anemic  . Anxiety    on meds  . Arthritis    "all over" (01/15/2017)  . Asthma   . Bronchitis with emphysema   . Chest pain   . Chronic bronchitis ([place])   . Coronary artery disease    a. 01/2017 she underwent orbital atherectomy/DES to the proxmal LAD and PTCA to ostial D2. 2D Echo 01/15/17 showed mild LVH, EF 60-65%, grade 1 DD.  . Family history of anesthesia complication    daughter N/V  . Fibromyalgia   . GERD (gastroesophageal reflux disease)    on meds  . Glaucoma   . Heart murmur   . History of hiatal hernia   . Hx of echocardiogram    Echo (03/2013):  Tech limited; Mild focal basal septal hypertrophy, EF 60-65%, normal RVF  . Hyperlipidemia   . Hypertension   . Nonsustained ventricular tachycardia ([place])   . OSA on CPAP   . Premature atrial contractions   . PVC (premature ventricular contraction)    a. Holter 12/16: NSR, occ  PAC,PVCs  . Sarcoidosis   . Type II diabetes mellitus ([place])    Past Surgical History:  Procedure Laterality Date  . BREAST SURGERY    . CARDIAC CATHETERIZATION  05/04/2007   reveals overall normal left ventricular systolic function. Ejection fraction 65-70%  . CATARACT EXTRACTION W/PHACO Right 07/20/2013   Procedure: CATARACT EXTRACTION PHACO AND INTRAOCULAR LENS PLACEMENT (IOC);  Surgeon: Marylynn Pearson, MD;  Location: [facility];  Service: Ophthalmology;  Laterality: Right;  . COLONOSCOPY W/ BIOPSIES AND POLYPECTOMY    . COLONOSCOPY WITH PROPOFOL N/A 09/07/2014   Procedure: COLONOSCOPY WITH PROPOFOL;  Surgeon: Juanita Craver, MD;  Location: [facility];  Service: Endoscopy;  Laterality: N/A;  . COLONOSCOPY WITH PROPOFOL N/A 01/10/2020   Procedure: COLONOSCOPY WITH PROPOFOL;  Surgeon: Juanita Craver, MD;  [facility];  Service: Endoscopy;  Laterality: N/A;  . CORONARY ANGIOPLASTY WITH  STENT PLACEMENT  01/15/2017  . CORONARY ATHERECTOMY N/A 01/15/2017   Procedure: CORONARY ATHERECTOMY;  Surgeon: Nelva Bush, MD;  Location: [place] [facility];  Service: Cardiovascular;  Laterality: N/A;  . CORONARY BALLOON ANGIOPLASTY N/A 01/15/2017   Procedure: CORONARY BALLOON ANGIOPLASTY;  Surgeon: Nelva Bush, MD;  Location: [facility];  Service: Cardiovascular;  Laterality: N/A;  . CORONARY STENT INTERVENTION N/A 01/15/2017   Procedure: CORONARY STENT INTERVENTION;  Surgeon: Nelva Bush, MD;  Location: [place] [facility];  Service: Cardiovascular;  Laterality: N/A;  . ESOPHAGOGASTRODUODENOSCOPY (EGD) WITH PROPOFOL N/A 09/07/2014   Procedure: ESOPHAGOGASTRODUODENOSCOPY (EGD) WITH PROPOFOL;  Surgeon: Juanita Craver, MD;  [facility];  Service: Endoscopy;  Laterality: N/A;  . EXTERNAL EAR SURGERY Bilateral 1970s   tumors removed  . EYE SURGERY Left 2019   cataract extraction   . EYE SURGERY Right 02/09/2018   eyelid surgery   . INTRAVASCULAR PRESSURE WIRE/FFR STUDY N/A  12/12/2016   Procedure: INTRAVASCULAR PRESSURE WIRE/FFR STUDY;  Surgeon: Nelva Bush, MD;  Location: [facility];  Service: Cardiovascular;  Laterality: N/A;  . MINI SHUNT INSERTION Right 07/20/2013   Procedure: INSERTION OF GLAUCOMA FILTRATION DEVICE RIGHT EYE;  Surgeon: Marylynn Pearson, MD;  Location: [place];  Service: Ophthalmology;  Laterality: Right;  . MITOMYCIN C APPLICATION Right 9/76/7341   Procedure: MITOMYCIN C APPLICATION;  Surgeon: Marylynn Pearson, MD;  Location: [place];  Service: Ophthalmology;  Laterality: Right;  . MITOMYCIN C APPLICATION Right 9/37/9024   Procedure: MITOMYCIN C APPLICATION RIGHT EYE;  Surgeon: Marylynn Pearson, MD;  Location: [place];  Service: Ophthalmology;  Laterality: Right;  . PLACEMENT OF BREAST IMPLANTS Bilateral 1992   "took all my breast tissue out; put implants in;fibrocystic breast disease "  . POLYPECTOMY  01/10/2020   Procedure: POLYPECTOMY;  Surgeon: Juanita Craver, MD;  [facility];  Service: Endoscopy;;  . RIGHT/LEFT HEART CATH AND CORONARY ANGIOGRAPHY N/A 12/12/2016   Procedure: RIGHT/LEFT HEART CATH AND CORONARY ANGIOGRAPHY;  Surgeon: Nelva Bush, MD;  Location: [place];  Service: Cardiovascular;  Laterality: N/A;  . TONSILLECTOMY    . TOTAL ABDOMINAL HYSTERECTOMY  1982  . TRABECULECTOMY Right 02/21/2015   Procedure: TRABECULECTOMY WITH [facility] ON THE RIGHT EYE;  Surgeon: Marylynn Pearson, MD;  Location: [facility];  Service: Ophthalmology;  Laterality: Right;   Family History  Problem Relation Age of Onset  . Heart disease Father   . Diabetes Father   . Glaucoma Father   . Cancer Mother        unknown type, ?lung  . Heart disease Paternal Grandmother   . Cancer Paternal Grandmother        unknown type  . Diabetes Brother   . Glaucoma Brother    Social History   Socioeconomic History  . Marital status: Married    Spouse name: Not on file  . Number of children: 2  . Years of education: Not on file  . Highest education  level: Not on file  Occupational History  . Occupation: unemployed    Fish farm manager: [employer]  Tobacco Use  . Smoking status: Former Smoker    Packs/day: 1.00    Years: 4.00    Pack years: 4.00    Types: Cigarettes    Quit date: 02/11/1980    Years since quitting: 40.1  . Smokeless tobacco: Never Used  Vaping Use  . Vaping Use: Never used  Substance and Sexual Activity  . Alcohol use: No  . Drug use:  No  . Sexual activity: Yes  Other Topics Concern  . Not on file  Social History Narrative  . Not on file   Social Determinants of Health   Financial Resource Strain: Medium Risk  . Difficulty of Paying Living Expenses: Somewhat hard  Food Insecurity: No Food Insecurity  . Worried About Charity fundraiser in the Last Year: Never true  . Ran Out of Food in the Last Year: Never true  Transportation Needs: No Transportation Needs  . Lack of Transportation (Medical): No  . Lack of Transportation (Non-Medical): No  Physical Activity: Inactive  . Days of Exercise per Week: 0 days  . Minutes of Exercise per Session: 0 min  Stress: Stress Concern Present  . Feeling of Stress : To some extent  Social Connections: Not on file    Tobacco Counseling Counseling given: Not Answered   Clinical Intake:  Pre-visit preparation completed: Yes  Pain : 0-10 Pain Score: 5  Pain Type: Chronic pain Pain Location: Back Pain Orientation: Mid Pain Descriptors / Indicators: Aching Pain Onset: More than a month ago Pain Frequency: Constant     Nutritional Status: BMI > 30  Obese Nutritional Risks: None Diabetes: Yes  How often do you need to have someone help you when you read instructions, pamphlets, or other written materials from your doctor or pharmacy?: 1 - Never What is the last grade level you completed in school?: 61yrs college  Diabetic? Yes Nutrition Risk Assessment:  Has the patient had any N/V/D within the last 2 months?  No  Does the patient have any  non-healing wounds?  No  Has the patient had any unintentional weight loss or weight gain?  Yes   Diabetes:  Is the patient diabetic?  Yes  If diabetic, was a CBG obtained today?  No  Did the patient bring in their glucometer from home?  No  How often do you monitor your CBG's? Doesn't check.   Financial Strains and Diabetes Management:  Are you having any financial strains with the device, your supplies or your medication? No .  Does the patient want to be seen by Chronic Care Management for management of their diabetes?  No  Would the patient like to be referred to a Nutritionist or for Diabetic Management?  No   Diabetic Exams:  Diabetic Eye Exam: Completed 07/19/2019 Diabetic Foot Exam: Overdue, Pt has been advised about the importance in completing this exam. Pt is scheduled for diabetic foot exam on next appointment.   Interpreter Needed?: No  Information entered by :: NAllen LPN   Activities of Daily Living In your present state of health, do you have any difficulty performing the following activities: 03/28/2020  Hearing? Y  Comment hearing aides  Vision? Y  Comment severe glaucoma  Difficulty concentrating or making decisions? Y  Comment memory  Walking or climbing stairs? Y  Comment SOB  Dressing or bathing? N  Doing errands, shopping? N  Preparing Food and eating ? N  Using the Toilet? N  In the past six months, have you accidently leaked urine? Y  Do you have problems with loss of bowel control? N  Managing your Medications? N  Managing your Finances? N  Housekeeping or managing your Housekeeping? N  Some recent data might be hidden    Patient Care Team: Glendale Chard, MD as PCP - General (Internal Medicine) [name], Wonda Cheng, MD as PCP - Cardiology (Cardiology) [name], Wonda Cheng, MD as Consulting Physician (Cardiology) Little, [name],  RN as Case Manager  Indicate any recent Medical Services you may have received from other than [facility] providers in the  past year (date may be approximate).     Assessment:   This is a routine wellness examination for Kylah.  Hearing/Vision screen  Hearing Screening   125Hz  250Hz  500Hz  1000Hz  2000Hz  3000Hz  4000Hz  6000Hz  8000Hz   Right ear:           Left ear:           Vision Screening Comments: Regular eye exams, [place], [facility]  Dietary issues and exercise activities discussed: Current Exercise Habits: The patient does not participate in regular exercise at present  Goals    .  "I need to have this hernia evaluated"      CARE PLAN ENTRY (see longtitudinal plan of care for additional care plan information)  Current Barriers:  [name] Knowledge Deficits related to evaluation and treatment of Hiatal Hernia  . Chronic Disease Management support and education needs related to Uncontrolled type two DM with hyperglycemia, Vitamin D deficiency, Fibromyalgia, Hypertension, Peripheral Vascular disease  Nurse Case Manager Clinical Goal(s):  [name] Over the next 90 days, patient will work with CCM RN CM and PCP  to address needs related to evaluation and treatment for Hiatal Hernia   Goal Met  . 09/22/19 New Over the next 90 days, patient will verbalize increased understanding and knowledge related to surgical recommendations for treatment of Hiatal Hernia  CCM RN CM Interventions:  09/22/19 call completed with patient  . Evaluation of current treatment plan related to Hiatal Hernia and patient's adherence to plan as established by provider . Determined patient has a hiatal hernia that is enlarged causing her pain and shortness of breath at times . Determined patient followed up with Dr. Collene Mares who advised the hernia may not be operative due to being too large . Discussed patient was referred to Dr. Nadeen Landau, general surgeon who will provide a second opinion for surgical repair of the hernia  . Discussed plans with patient for ongoing care management follow up and provided patient  with direct contact information for care management team  Patient Self Care Activities:  . Self administers medications as prescribed . Attends all scheduled provider appointments . Calls pharmacy for medication refills . Performs ADL's independently . Performs IADL's independently . Calls provider office for new concerns or questions  Please see past updates related to this goal by clicking on the "Past Updates" button in the selected goal      .  "I would like to keep my diabetes under control"      CARE PLAN ENTRY (see longtitudinal plan of care for additional care plan information)  Current Barriers:  [name] Knowledge Deficits related to disease process and Self Health management of diabetes . Chronic Disease Management support and education needs related to Uncontrolled type two DM with hyperglycemia, Vitamin D deficiency, Fibromyalgia, Hypertension, Peripheral Vascular disease  Nurse Case Manager Clinical Goal(s):  . 09/22/19 New Over the next 180 days, patient will work with CCM RN CM and PCP  to address needs related to disease education and support to improve Self Health management of DM   CCM RN CM Interventions:  11/29/19 call completed with patient  . Evaluation of current treatment plan related to Diabetes and patient's adherence to plan as established by provider . Reinforced education to patient re: A1c remains to be 6.3%; Re-Educated on how to maintain and control DM with adherence  to exercise, diet and taking medications exactly as prescribed; Re-Educated on daily glycemic control, FBS 80-130, <180 after meals; patient feels satisfied with her current DM treatment plan   . Reviewed medications with patient and discussed indication, dosage and frequency of Janumet 50-500 mg bid with meals  . Provided patient with printed educational materials related to Diabetes Management using Meal Planning  . Advised patient, providing education and rationale, to check cbg daily before  meals and record, calling CCM RN CM and or PCP for findings outside established parameters  . Discussed plans with patient for ongoing care management follow up and provided patient with direct contact information for care management team  Patient Self Care Activities:  . Self administers medications as prescribed . Attends all scheduled provider appointments . Calls pharmacy for medication refills . Performs ADL's independently . Performs IADL's independently . Calls provider office for new concerns or questions  Please see past updates related to this goal by clicking on the "Past Updates" button in the selected goal      .  "to get the lymphedema under control" (pt-stated)      CARE PLAN ENTRY (see longitudinal plan of care for additional care plan information)  Current Barriers:  [name] Knowledge Deficits related to disease process and Self Health management of Lymphedema  . Chronic Disease Management support and education needs related to Uncontrolled type two DM with hyperglycemia, Vitamin D deficiency,  Fibromyalgia, Hypertension, Peripheral Vascular disease  Nurse Case Manager Clinical Goal(s):  [name] Over the next 90 days, patient will work with the [place] and PCP to address needs related to disease education and support for improved Self Health management of Lymphedema  CCM RN CM Interventions:  11/29/19 call completed with patient . Inter-disciplinary care team collaboration (see longitudinal plan of care) . Evaluation of current treatment plan related to lymphedema and patient's adherence to plan as established by provider . Determined Dr. Baird Cancer ordered lymphedema therapy for patient for treatment of bilateral lower extremity lymphedema  . Confirmed patient continues to self-apply and is using the pneumatic pumps for treatment of bilateral lymphedema as directed with good effectiveness  . Discussed plans with patient for ongoing care management follow up and provided patient  with direct contact information for care management team  Patient Self Care Activities:  . Self administers medications as prescribed . Attends all scheduled provider appointments . Calls pharmacy for medication refills . Calls provider office for new concerns or questions  Please see past updates related to this goal by clicking on the "Past Updates" button in the selected goal      .  "to have less heart palpitations" (pt-stated)      CARE PLAN ENTRY (see longitudinal plan of care for additional care plan information)  Current Barriers:  [name] Knowledge Deficits related to evaluation and treatment of heart palpitations  . Chronic Disease Management support and education needs related to Uncontrolled type two DM with hyperglycemia, Vitamin D deficiency,  Fibromyalgia, Hypertension, Peripheral Vascular disease, Lymphedema of both lower extremities  Nurse Case Manager Clinical Goal(s):  [name] Over the next 180 days, patient will work with the CCM team and PCP to address needs related to disease education and support for evaluation, treatment and Self Health management of heart palpitations  CCM RN CM Interventions:  11/29/19 call completed with patient  . Inter-disciplinary care team collaboration (see longitudinal plan of care) . Evaluation of current treatment plan related to heart palpitations and patient's adherence to plan as  established by provider . Provided education to patient re: disease process and treatment recommendations for cardiac arrhythmia's . Reviewed medications with patient and discussed patient is taking her prescribed medications for this condition prescribed by Cardiologist Dr. Acie Fredrickson; Educated patient on the indication, dosing and frequency of these recommendations . Determined patient does not feel satisfied with how this condition is being managed, she has followed all MD recommendations and holter monitor was negative, she has next f/u appointment with Dr. Acie Fredrickson on  12/15/19 @2 :00 PM  . Encouraged patient to discuss her concerns and ongoing c/o heart palpitations; Educated patient on the possibility of working with a Pharm D along with Cardiology if medication changes are recommended by Dr. Acie Fredrickson . Discussed plans with patient for ongoing care management follow up and provided patient with direct contact information for care management team  Patient Self Care Activities:  . Self administers medications as prescribed . Attends all scheduled provider appointments . Calls pharmacy for medication refills . Calls provider office for new concerns or questions  Initial goal documentation     .  improve adherence with taking Vitamin d      CARE PLAN ENTRY (see longitudinal plan of care for additional care plan information)  Current Barriers:  [name] Knowledge Deficits related to disease process and Self Health management of Vitamin D deficiency . Chronic Disease Management support and education needs related to Uncontrolled type two DM with hyperglycemia, Vitamin D deficiency,  Fibromyalgia, Hypertension, Peripheral Vascular disease, Lymphedema of both lower extremities  Nurse Case Manager Clinical Goal(s):  [name] Over the next 60 days, patient will work with the CCM team and PCP to address needs related to disease education and support for improved Self Health management of Vitamin d deficiency with improved adherence to taking Vitamin D as prescribed   CCM RN CM Interventions:  11/29/19 call completed with patient  . Inter-disciplinary care team collaboration (see longitudinal plan of care) . Evaluation of current treatment plan related to Vitamin d deficiency and patient's adherence to plan as established by provider . Noted patient's last Vitamin D level was checked on 10/07/18 with a range of 34.9; noted per Dr.Sanders following this result, she recommended patient start a Vitamin D supplement in order to raise this level for optimal health; Determined patient is  not taking Vitamin D after completing med recon with patient . Discussed plans with patient for ongoing care management follow up and provided patient with direct contact information for care management team  Patient Self Care Activities:  . Self administers medications as prescribed . Attends all scheduled provider appointments . Calls pharmacy for medication refills . Calls provider office for new concerns or questions  Initial goal documentation     .  Patient Stated      No goals at this time    .  Patient Stated      03/23/2019, wants to be pain free     .  Patient Stated      03/28/2020, no goals      Depression Screen PHQ 2/9 Scores 03/28/2020 03/23/2019 07/20/2018 03/18/2018 03/18/2018 03/18/2018 01/13/2018  PHQ - 2 Score 0 2 - 1 3 4  0  PHQ- 9 Score - 7 - - 9 15 -  Exception Documentation - - Patient refusal - - - -    Fall Risk Fall Risk  03/28/2020 03/23/2019 10/20/2018 10/07/2018 07/20/2018  Falls in the past year? 1 0 0 1 0  Comment lost balance - - - -  Number falls in past yr: 1 - 1 1 -  Comment - - - - -  Injury with Fall? 0 - - 0 -  Risk for fall due to : Impaired balance/gait;Medication side effect Medication side effect - - -  Follow up Falls evaluation completed;Education provided;Falls prevention discussed Falls evaluation completed;Education provided;Falls prevention discussed - - -    FALL RISK PREVENTION PERTAINING TO THE HOME:  Any stairs in or around the home? Yes  If so, are there any without handrails? Yes  Home free of loose throw rugs in walkways, pet beds, electrical cords, etc? Yes  Adequate lighting in your home to reduce risk of falls? Yes   ASSISTIVE DEVICES UTILIZED TO PREVENT FALLS:  Life alert? No  Use of a cane, walker or w/c? No  Grab bars in the bathroom? Yes  Shower chair or bench in shower? No  Elevated toilet seat or a handicapped toilet? Yes   TIMED UP AND GO:  Was the test performed? No . .   Gait slow and steady without use of  assistive device  Cognitive Function:     6CIT Screen 03/28/2020 03/23/2019 03/18/2018  What Year? 0 points 0 points 0 points  What month? 0 points 0 points 0 points  What time? 0 points 0 points 0 points  Count back from 20 0 points 0 points 0 points  Months in reverse 0 points 0 points 0 points  Repeat phrase 0 points 0 points 0 points  Total Score 0 0 0    Immunizations Immunization History  Administered Date(s) Administered  . Fluad Quad(high Dose 65+) 12/30/2019  . Influenza Split 10/31/2013  . Influenza Whole 12/23/2017  . Influenza, High Dose Seasonal PF 11/18/2016, 12/21/2017, 11/23/2018  . Influenza,inj,Quad PF,6+ Mos 02/25/2013, 03/19/2015  . Influenza,inj,quad, With Preservative 11/11/2018  . PFIZER(Purple Top)SARS-COV-2 Vaccination 03/01/2019, 03/22/2019, 11/22/2019  . Pneumococcal Polysaccharide-23 12/21/2017  . Zoster Recombinat (Shingrix) 12/25/2017    TDAP status: Up to date  Flu Vaccine status: Up to date  Pneumococcal vaccine status: Due, Education has been provided regarding the importance of this vaccine. Advised may receive this vaccine at local pharmacy or Health Dept. Aware to provide a copy of the vaccination record if obtained from local pharmacy or Health Dept. Verbalized acceptance and understanding.  Covid-19 vaccine status: Completed vaccines  Qualifies for Shingles Vaccine? Yes   Zostavax completed No   Shingrix Completed?: Yes needs second dose  Screening Tests Health Maintenance  Topic Date Due  . PNA vac Low Risk Adult (2 of 2 - PCV13) 12/22/2018  . FOOT EXAM  10/07/2019  . URINE MICROALBUMIN  03/22/2020  . HEMOGLOBIN A1C  05/28/2020  . MAMMOGRAM  12/22/2020  . OPHTHALMOLOGY EXAM  01/18/2021  . TETANUS/TDAP  11/10/2022  . COLONOSCOPY (Pts 45-75yr Insurance coverage will need to be confirmed)  01/09/2030  . INFLUENZA VACCINE  Completed  . DEXA SCAN  Completed  . COVID-19 Vaccine  Completed  . Hepatitis C Screening  Completed     Health Maintenance  Health Maintenance Due  Topic Date Due  . PNA vac Low Risk Adult (2 of 2 - PCV13) 12/22/2018  . FOOT EXAM  10/07/2019  . URINE MICROALBUMIN  03/22/2020    Colorectal cancer screening: No longer required.   Mammogram status: Completed 2021. Repeat every year  Bone Density status: Completed 12/15/2013.   Lung Cancer Screening: (Low Dose CT Chest recommended if Age 73-80years, 30 pack-year currently smoking OR have quit w/in 15years.) does  not qualify.   Lung Cancer Screening Referral: no  Additional Screening:  Hepatitis C Screening: does qualify; Completed 12/14/2017  Vision Screening: Recommended annual ophthalmology exams for early detection of glaucoma and other disorders of the eye. Is the patient up to date with their annual eye exam?  Yes  Who is the provider or what is the name of the office in which the patient attends annual eye exams? [facility] If pt is not established with a provider, would they like to be referred to a provider to establish care? No .   Dental Screening: Recommended annual dental exams for proper oral hygiene  Community Resource Referral / Chronic Care Management: CRR required this visit?  No   CCM required this visit?  No      Plan:     I have personally reviewed and noted the following in the patient's chart:   . Medical and social history . Use of alcohol, tobacco or illicit drugs  . Current medications and supplements . Functional ability and status . Nutritional status . Physical activity . Advanced directives . List of other physicians . Hospitalizations, surgeries, and ER visits in previous 12 months . Vitals . Screenings to include cognitive, depression, and falls . Referrals and appointments  In addition, I have reviewed and discussed with patient certain preventive protocols, quality metrics, and best practice recommendations. A written personalized care plan for preventive services as well as  general preventive health recommendations were provided to patient.     Kellie Simmering, LPN   1/61/0960   Nurse Notes:

## 2020-03-29 LAB — BMP8+EGFR
BUN/Creatinine Ratio: 14 (ref 12–28)
BUN: 11 mg/dL (ref 8–27)
CO2: 22 mmol/L (ref 20–29)
Calcium: 9.7 mg/dL (ref 8.7–10.3)
Chloride: 103 mmol/L (ref 96–106)
Creatinine, Ser: 0.76 mg/dL (ref 0.57–1.00)
GFR calc Af Amer: 91 mL/min/{1.73_m2} (ref >59)
GFR calc non Af Amer: 79 mL/min/{1.73_m2} (ref >59)
Glucose: 114 mg/dL — ABNORMAL HIGH (ref 65–99)
Potassium: 4.3 mmol/L (ref 3.5–5.2)
Sodium: 142 mmol/L (ref 134–144)

## 2020-03-29 LAB — HEMOGLOBIN A1C
Est. average glucose Bld gHb Est-mCnc: 128 mg/dL
Hgb A1c MFr Bld: 6.1 % — ABNORMAL HIGH (ref 4.8–5.6)

## 2020-03-29 LAB — VITAMIN B12: Vitamin B-12: 317 pg/mL (ref 232–1245)

## 2020-03-30 ENCOUNTER — Encounter: Payer: Self-pay | Admitting: Internal Medicine

## 2020-03-30 ENCOUNTER — Telehealth: Payer: Medicare Other

## 2020-04-02 ENCOUNTER — Telehealth: Payer: Self-pay

## 2020-04-02 NOTE — Telephone Encounter (Signed)
Pt c/o swelling that has been going on in both legs and worsening. LL > RL , plus pain. Pt has been elevating her legs and does wear compression. She does not have a contact for lymphedema therapy. Per APP, she has been scheduled for a reflux study and will see APP after. Pt verbalized understanding and has no further questions at this time.

## 2020-04-03 ENCOUNTER — Encounter: Payer: Self-pay | Admitting: Neurology

## 2020-04-03 ENCOUNTER — Other Ambulatory Visit (HOSPITAL_COMMUNITY): Payer: Medicare Other

## 2020-04-03 ENCOUNTER — Ambulatory Visit (INDEPENDENT_AMBULATORY_CARE_PROVIDER_SITE_OTHER): Payer: Medicare Other

## 2020-04-03 ENCOUNTER — Ambulatory Visit (INDEPENDENT_AMBULATORY_CARE_PROVIDER_SITE_OTHER): Payer: Medicare Other | Admitting: Neurology

## 2020-04-03 ENCOUNTER — Other Ambulatory Visit: Payer: Self-pay

## 2020-04-03 VITALS — BP 122/80 | HR 96 | Temp 98.7°F | Ht 61.6 in | Wt 174.4 lb

## 2020-04-03 VITALS — BP 150/81 | HR 78 | Ht 62.0 in | Wt 176.2 lb

## 2020-04-03 DIAGNOSIS — I69398 Other sequelae of cerebral infarction: Secondary | ICD-10-CM

## 2020-04-03 DIAGNOSIS — I25119 Atherosclerotic heart disease of native coronary artery with unspecified angina pectoris: Secondary | ICD-10-CM | POA: Diagnosis not present

## 2020-04-03 DIAGNOSIS — E538 Deficiency of other specified B group vitamins: Secondary | ICD-10-CM | POA: Diagnosis not present

## 2020-04-03 DIAGNOSIS — G44209 Tension-type headache, unspecified, not intractable: Secondary | ICD-10-CM | POA: Diagnosis not present

## 2020-04-03 DIAGNOSIS — R2689 Other abnormalities of gait and mobility: Secondary | ICD-10-CM

## 2020-04-03 DIAGNOSIS — R413 Other amnesia: Secondary | ICD-10-CM

## 2020-04-03 DIAGNOSIS — Z8673 Personal history of transient ischemic attack (TIA), and cerebral infarction without residual deficits: Secondary | ICD-10-CM | POA: Diagnosis not present

## 2020-04-03 MED ORDER — CYANOCOBALAMIN 1000 MCG/ML IJ SOLN
1000.0000 ug | Freq: Once | INTRAMUSCULAR | Status: AC
  Administered 2020-04-03: 1000 ug via INTRAMUSCULAR

## 2020-04-03 NOTE — Progress Notes (Signed)
Patient is here for 1st b12 shot. She will get once a week for 4 weeks then monthly.

## 2020-04-03 NOTE — Progress Notes (Signed)
[facility] [address] [place]. [place] [address] [phone]       OFFICE CONSULT NOTE  [name] Date of Birth:  11/08/1945 Medical Record Number:  [id number]   Referring MD:  Glendale Chard  Reason for Referral:  stroke  HPI: Sheila Reynolds is a 75 year old African-American lady seen today for initial office consultation visit for stroke.  She is accompanied by her husband today.  History is obtained from them and review of referral notes and have personally reviewed imaging films in PACS.  The patient had been having steady decline in the vision acuity in the right eye and for which she was seen by neuro-ophthalmology recently.  They ordered an MRI of the brain and orbits which was done on 10/05/2018 which I personally reviewed MRI of the orbits was unremarkable but MRI scan of the brain showed a small left thalamic acute infarct.  Patient had no neurological symptoms at that time and this is likely an incidental finding.  Patient denied any symptoms of right-sided numbness, tingling, weakness gait or balance problems or memory problems.  She thought a few days prior to her MRI she had some twitchings in the left side of the face but this lasted only a few hours and recovered.  She denied any increased watering of the eyes lack of sensation or weakness.  Patient has no prior history of strokes, TIAs, seizures, migraines or significant neurological problems.  She had lab work on 10/07/2018 which showed LDL cholesterol of 71 mg percent.  She takes Lipitor 40 mg daily.  She had a hemoglobin A1c of 6.1 on 07/20/2018.  Her blood pressure is usually well controlled and today it is 134/77.  Her fasting sugars have all been good in the 110 range.  She also has sleep apnea and is quite compliant with her CPAP and takes it every night.  She has not had any vascular imaging studies done for the brain of the neck.  She has no neurological complaints today. Update 04/03/2020: She  returns for follow-up after last visit year and half ago.  She has new complaints of worsening balance, dizziness, memory as well as headaches and neck pain.  She states her balance is poor and she has had a few falls but no injuries.  She has to be careful and often can catch her self and does not fall.  She denies any significant neck pain radiating down her spine but does complain of tightness in the neck and trouble moving her neck.  She has had no recent neck x-rays or scans done.  She also complains of posterior headaches as well as bifrontal headaches which are intermittent and last only few minutes.  She takes some Tylenol which seems to help.  Cold compression also seems to help her headaches.  She did undergo some carotid ultrasound at last visit after she saw me on 10/29/2018 which showed no significant extracranial stenosis and transcranial Doppler study was also done which showed poor left improvement of but normal velocities in the vessels that could be studied.  On inquiry she admits to numbness in the right hand which is intermittent but more in the night.  She does agree that the numbness seems to more prominent when she performs activities with rapid repetitive wrist flexion. ROS:   14 system review of systems is positive for headache, neck pain, imbalance, memory loss, stroke and all other systems negative e and all other systems negative  PMH:  Past  Medical History:  Diagnosis Date  . Anemia    3 months ago anemic  . Anxiety    on meds  . Arthritis    "all over" (01/15/2017)  . Asthma   . Bronchitis with emphysema   . Chest pain   . Chronic bronchitis (HCC)   . Coronary artery disease    a. 01/2017 she underwent orbital atherectomy/DES to the proxmal LAD and PTCA to ostial D2. 2D Echo 01/15/17 showed mild LVH, EF 60-65%, grade 1 DD.  . Family history of anesthesia complication    daughter N/V  . Fibromyalgia   . GERD (gastroesophageal reflux disease)    on meds  . Glaucoma    . Heart murmur   . History of hiatal hernia   . Hx of echocardiogram    Echo (03/2013):  Tech limited; Mild focal basal septal hypertrophy, EF 60-65%, normal RVF  . Hyperlipidemia   . Hypertension   . Nonsustained ventricular tachycardia (HCC)   . OSA on CPAP   . Premature atrial contractions   . PVC (premature ventricular contraction)    a. Holter 12/16: NSR, occ PAC,PVCs  . Sarcoidosis   . Type II diabetes mellitus (HCC)     Social History:  Social History   Socioeconomic History  . Marital status: Married    Spouse name: Not on file  . Number of children: 2  . Years of education: Not on file  . Highest education level: Not on file  Occupational History  . Occupation: unemployed    Associate Professor: [employer]  Tobacco Use  . Smoking status: Former Smoker    Packs/day: 1.00    Years: 4.00    Pack years: 4.00    Types: Cigarettes    Quit date: 02/11/1980    Years since quitting: 40.1  . Smokeless tobacco: Never Used  Vaping Use  . Vaping Use: Never used  Substance and Sexual Activity  . Alcohol use: No  . Drug use: No  . Sexual activity: Yes  Other Topics Concern  . Not on file  Social History Narrative   Lives with husband   Right hand   Drinks 2-3 cups caffeine daily   Social Determinants of Health   Financial Resource Strain: Medium Risk  . Difficulty of Paying Living Expenses: Somewhat hard  Food Insecurity: No Food Insecurity  . Worried About Programme researcher, broadcasting/film/video in the Last Year: Never true  . Ran Out of Food in the Last Year: Never true  Transportation Needs: No Transportation Needs  . Lack of Transportation (Medical): No  . Lack of Transportation (Non-Medical): No  Physical Activity: Inactive  . Days of Exercise per Week: 0 days  . Minutes of Exercise per Session: 0 min  Stress: Stress Concern Present  . Feeling of Stress : To some extent  Social Connections: Not on file  Intimate Partner Violence: Not on file    Medications:    Current Outpatient Medications on File Prior to Visit  Medication Sig Dispense Refill  . acetaminophen (TYLENOL) 500 MG tablet Take 1,000 mg by mouth 2 (two) times daily as needed for moderate pain or headache.    . albuterol (PROVENTIL) (2.5 MG/3ML) 0.083% nebulizer solution Take 3 mLs (2.5 mg total) by nebulization daily as needed for wheezing or shortness of breath. 75 mL 12  . albuterol (VENTOLIN HFA) 108 (90 Base) MCG/ACT inhaler Inhale 2 puffs into the lungs every 6 (six) hours as needed for wheezing or shortness of breath.  18 g 12  . aspirin EC 81 MG tablet Take 81 mg by mouth daily. Swallow whole.    [name] atorvastatin (LIPITOR) 80 MG tablet TAKE 1 TABLET(80 MG) BY MOUTH DAILY (Patient taking differently: Take 80 mg by mouth at bedtime.) 90 tablet 2  . Blood Glucose Monitoring Suppl (ACCU-CHEK AVIVA PLUS) w/Device KIT Use to check blood sugars 3 times a day. Dx code e11.65 1 kit 3  . brimonidine (ALPHAGAN P) 0.1 % SOLN Place 1 drop into both eyes 2 (two) times daily.     . brinzolamide (AZOPT) 1 % ophthalmic suspension Place 1 drop into both eyes 3 (three) times daily.    . carvedilol (COREG) 12.5 MG tablet TAKE 1 TABLET(12.5 MG) BY MOUTH TWICE DAILY (Patient taking differently: Take 12.5 mg by mouth in the morning and at bedtime.) 180 tablet 3  . clopidogrel (PLAVIX) 75 MG tablet Take 1 tablet (75 mg total) by mouth daily. 90 tablet 3  . Fluticasone-Umeclidin-Vilant (TRELEGY ELLIPTA) 100-62.5-25 MCG/INH AEPB Inhale 1 puff into the lungs daily. 60 each 5  . isosorbide mononitrate (IMDUR) 60 MG 24 hr tablet TAKE 1 TABLET(60 MG) BY MOUTH DAILY (Patient taking differently: Take 60 mg by mouth daily.) 90 tablet 3  . JANUMET 50-500 MG tablet TAKE 1 TABLET BY MOUTH TWICE DAILY WITH A MEAL 180 tablet 1  . Lancets (ONETOUCH DELICA PLUS [id number]) MISC CHECK BLOOD SUGAR BEFORE BREAKFAST AND DINNER 100 each 1  . Misc Natural Products (FOCUSED MIND PO) Take 4 tablets by mouth daily. For memory    .  montelukast (SINGULAIR) 10 MG tablet TAKE 1 TABLET(10 MG) BY MOUTH EVERY EVENING (Patient taking differently: Take 10 mg by mouth at bedtime.) 90 tablet 1  . Nebulizers (COMPRESSOR/NEBULIZER) MISC Use as directed 1 each 0  . nitroGLYCERIN (NITROSTAT) 0.4 MG SL tablet Place 1 tablet (0.4 mg total) under the tongue every 5 (five) minutes as needed for chest pain. 25 tablet prn  . pantoprazole (PROTONIX) 40 MG tablet TAKE 1 TABLET(40 MG) BY MOUTH DAILY (Patient taking differently: Take 40 mg by mouth daily before breakfast.) 30 tablet 11  . Polyvinyl Alcohol-Povidone PF (REFRESH) 1.4-0.6 % SOLN Place 1-2 drops into both eyes 3 (three) times daily as needed (dry/irritated eyes.).    [name] potassium chloride SA (KLOR-CON) 20 MEQ tablet Take 1 tablet (20 mEq total) by mouth every other day. (Patient taking differently: Take 20 mEq by mouth every 3 (three) days.) 90 tablet 1  . prednisoLONE acetate (PRED FORTE) 1 % ophthalmic suspension Place 1 drop into both eyes 4 (four) times daily.     . pregabalin (LYRICA) 75 MG capsule TAKE 1 CAPSULE(75 MG) BY MOUTH TWICE DAILY 180 capsule 1  . Travoprost, BAK Free, (TRAVATAN) 0.004 % SOLN ophthalmic solution Place 1 drop into the right eye at bedtime.     . furosemide (LASIX) 40 MG tablet Take 1 tablet (40 mg total) by mouth every other day. (Patient taking differently: Take 40 mg by mouth every 3 (three) days.) 90 tablet 1   No current facility-administered medications on file prior to visit.    Allergies:   Allergies  Allergen Reactions  . Crestor [Rosuvastatin Calcium] Other (See Comments)    muscle aches  . Demerol  [Meperidine Hcl]     Other reaction(s): Hallucinations  . Shellfish Allergy Itching and Other (See Comments)    Crab, shrimp and lobster ---lips itch and tingle Was told not to eat again after having a allergy test. Lobster,  crab and shrimp     Physical Exam General: well developed, well nourished elderly African-American lady, seated, in no  evident distress Head: head normocephalic and atraumatic.   Neck: supple with no carotid or supraclavicular bruits Cardiovascular: regular rate and rhythm, no murmurs Musculoskeletal: no deformity Skin:  no rash/petichiae Vascular:  Normal pulses all extremities  Neurologic Exam Mental Status: Awake and fully alert. Oriented to place and time. Recent and remote memory intact. Attention span, concentration and fund of knowledge appropriate. Mood and affect appropriate.  Diminished recall 1/3.  Able to name 11 animals which can walk on 4 legs.  Clock drawing 4/4.  Mini-Mental status exam score 27/30.  Geriatric depression scale was 7 suggestive of mild depression. Cranial Nerves: Fundoscopic exam reveals sharp disc margins. Pupils equal, briskly reactive to light. Extraocular movements full without nystagmus. Visual fields full to confrontation.  Diminished vision acuity in the right eye.[name]  Hearing slightly diminished bilaterally. Facial sensation intact. Face, tongue, palate moves normally and symmetrically.  Motor: Normal bulk and tone. Normal strength in all tested extremity muscles. Sensory.: intact to touch , pinprick , position and vibratory sensation.  Tinel's sign positive over the right wrist. Coordination: Rapid alternating movements normal in all extremities. Finger-to-nose and heel-to-shin performed accurately bilaterally. Gait and Station: Arises from chair without difficulty. Stance is normal. Gait demonstrates normal stride length and balance . Able to heel, toe and tandem walk with slight difficulty.  Reflexes: 1+ and symmetric. Toes downgoing.      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  Vascular risk factors of hypertension, hyperlipidemia, diabetes and sleep apnea.  New complaints of  worsening balance, right hand numbness, memory loss, headaches which need evaluation     PLAN: I had a long discussion with patient regarding her new complaints of worsening balance, memory and headaches and prior history of stroke and answered questions and discuss evaluation and treatment plan.  I recommend we check MRI scan of the brain to look for interval new strokes and MRI scan C-spine to rule out compressive pathology in check memory panel labs, hemoglobin A1c, lipid profile and ESR.  Check EEG as well.  She was advised to continue aspirin and Plavix for stroke prevention given prior history of drug-coated cardiac stents as well as maintain aggressive risk factor modification strict control of hypertension with blood pressure goal below 130/90, lipids with LDL cholesterol goal below 70 mg percent and diabetes with hemoglobin A1c goal below 6.5%.  I also advised her to do regular neck stretching exercises to help with her tension headaches.  We also discussed memory compensation strategies.  I also advised her to avoid activities which involve rapid repetitive wrist flexion and to wear a wrist extension splint on the right hand to help with carpal tunnel.  She will return for follow-up in the future in 2 months or call earlier if necessary.  Greater than 50% time during this prolonged 40-minutevisit was spent on counseling and coordination of care about her lacunar stroke, new complaints of imbalance, memory loss, headache and discussion about stroke prevention and treatment and answering questions.  Followup in the future with me in 6 months or call earlier if necessary. Antony Contras, MD  [facility] [address] [place], [place] [address]  Phone [phone] Fax [phone] Note: This document was prepared with  digital dictation and possible smart Company secretary. Any transcriptional errors that result from this process are unintentional.

## 2020-04-03 NOTE — Patient Instructions (Addendum)
I had a long discussion with patient regarding her new complaints of worsening balance, memory and headaches and prior history of stroke and answered questions and discuss evaluation and treatment plan.  I recommend we check MRI scan of the brain to look for interval new strokes and MRI scan C-spine to rule out compressive pathology in check memory panel labs, hemoglobin A1c, lipid profile and ESR.  Check EEG as well.  She was advised to continue aspirin and Plavix for stroke prevention given prior history of drug-coated cardiac stents as well as maintain aggressive risk factor modification strict control of hypertension with blood pressure goal below 130/90, lipids with LDL cholesterol goal below 70 mg percent and diabetes with hemoglobin A1c goal below 6.5%.  I also advised her to do regular neck stretching exercises to help with her tension headaches.  We also discussed memory compensation strategies.  She will return for follow-up in the future in 2 months or call earlier if necessary.  Neck Exercises Ask your health care provider which exercises are safe for you. Do exercises exactly as told by your health care provider and adjust them as directed. It is normal to feel mild stretching, pulling, tightness, or discomfort as you do these exercises. Stop right away if you feel sudden pain or your pain gets worse. Do not begin these exercises until told by your health care provider. Neck exercises can be important for many reasons. They can improve strength and maintain flexibility in your neck, which will help your upper back and prevent neck pain. Stretching exercises Rotation neck stretching 1. Sit in a chair or stand up. 2. Place your feet flat on the floor, shoulder width apart. 3. Slowly turn your head (rotate) to the right until a slight stretch is felt. Turn it all the way to the right so you can look over your right shoulder. Do not tilt or tip your head. 4. Hold this position for 10-30  seconds. 5. Slowly turn your head (rotate) to the left until a slight stretch is felt. Turn it all the way to the left so you can look over your left shoulder. Do not tilt or tip your head. 6. Hold this position for 10-30 seconds. Repeat __________ times. Complete this exercise __________ times a day.   Neck retraction 1. Sit in a sturdy chair or stand up. 2. Look straight ahead. Do not bend your neck. 3. Use your fingers to push your chin backward (retraction). Do not bend your neck for this movement. Continue to face straight ahead. If you are doing the exercise properly, you will feel a slight sensation in your throat and a stretch at the back of your neck. 4. Hold the stretch for 1-2 seconds. Repeat __________ times. Complete this exercise __________ times a day. Strengthening exercises Neck press 1. Lie on your back on a firm bed or on the floor with a pillow under your head. 2. Use your neck muscles to push your head down on the pillow and straighten your spine. 3. Hold the position as well as you can. Keep your head facing up (in a neutral position) and your chin tucked. 4. Slowly count to 5 while holding this position. Repeat __________ times. Complete this exercise __________ times a day. Isometrics These are exercises in which you strengthen the muscles in your neck while keeping your neck still (isometrics). 1. Sit in a supportive chair and place your hand on your forehead. 2. Keep your head and face facing straight ahead. Do  not flex or extend your neck while doing isometrics. 3. Push forward with your head and neck while pushing back with your hand. Hold for 10 seconds. 4. Do the sequence again, this time putting your hand against the back of your head. Use your head and neck to push backward against the hand pressure. 5. Finally, do the same exercise on either side of your head, pushing sideways against the pressure of your hand. Repeat __________ times. Complete this exercise  __________ times a day. Prone head lifts 1. Lie face-down (prone position), resting on your elbows so that your chest and upper back are raised. 2. Start with your head facing downward, near your chest. Position your chin either on or near your chest. 3. Slowly lift your head upward. Lift until you are looking straight ahead. Then continue lifting your head as far back as you can comfortably stretch. 4. Hold your head up for 5 seconds. Then slowly lower it to your starting position. Repeat __________ times. Complete this exercise __________ times a day. Supine head lifts 1. Lie on your back (supine position), bending your knees to point to the ceiling and keeping your feet flat on the floor. 2. Lift your head slowly off the floor, raising your chin toward your chest. 3. Hold for 5 seconds. Repeat __________ times. Complete this exercise __________ times a day. Scapular retraction 1. Stand with your arms at your sides. Look straight ahead. 2. Slowly pull both shoulders (scapulae) backward and downward (retraction) until you feel a stretch between your shoulder blades in your upper back. 3. Hold for 10-30 seconds. 4. Relax and repeat. Repeat __________ times. Complete this exercise __________ times a day. Contact a health care provider if:  Your neck pain or discomfort gets much worse when you do an exercise.  Your neck pain or discomfort does not improve within 2 hours after you exercise. If you have any of these problems, stop exercising right away. Do not do the exercises again unless your health care provider says that you can. Get help right away if:  You develop sudden, severe neck pain. If this happens, stop exercising right away. Do not do the exercises again unless your health care provider says that you can. This information is not intended to replace advice given to you by your health care provider. Make sure you discuss any questions you have with your health care  provider. Document Revised: 11/25/2017 Document Reviewed: 11/25/2017 Elsevier Patient Education  [place]. Memory Compensation Strategies  5. Use "WARM" strategy.  W= write it down  A= associate it  R= repeat it  M= make a mental note  2.   You can keep a Social worker.  Use a 3-ring notebook with sections for the following: calendar, important names and phone numbers,  medications, doctors' names/phone numbers, lists/reminders, and a section to journal what you did  each day.   3.    Use a calendar to write appointments down.  4.    Write yourself a schedule for the day.  This can be placed on the calendar or in a separate section of the Memory Notebook.  Keeping a  regular schedule can help memory.  5.    Use medication organizer with sections for each day or morning/evening pills.  You may need help loading it  6.    Keep a basket, or pegboard by the door.  Place items that you need to take out with you in the basket or on the  pegboard.  You may also want to  include a message board for reminders.  7.    Use sticky notes.  Place sticky notes with reminders in a place where the task is performed.  For example: " turn off the  stove" placed by the stove, "lock the door" placed on the door at eye level, " take your medications" on  the bathroom mirror or by the place where you normally take your medications.  8.    Use alarms/timers.  Use while cooking to remind yourself to check on food or as a reminder to take your medicine, or as a  reminder to make a call, or as a reminder to perform another task, etc.

## 2020-04-04 ENCOUNTER — Telehealth: Payer: Self-pay | Admitting: Neurology

## 2020-04-04 NOTE — Telephone Encounter (Signed)
Medicare/aarp order sent to [facility]. No auth they will reach out to the patient to schedule.  

## 2020-04-05 LAB — LIPID PANEL
Chol/HDL Ratio: 3.5 ratio (ref 0.0–4.4)
Cholesterol, Total: 152 mg/dL (ref 100–199)
HDL: 44 mg/dL
LDL Chol Calc (NIH): 89 mg/dL (ref 0–99)
Triglycerides: 104 mg/dL (ref 0–149)
VLDL Cholesterol Cal: 19 mg/dL (ref 5–40)

## 2020-04-05 LAB — HEMOGLOBIN A1C
Est. average glucose Bld gHb Est-mCnc: 126 mg/dL
Hgb A1c MFr Bld: 6 % — ABNORMAL HIGH (ref 4.8–5.6)

## 2020-04-05 LAB — DEMENTIA PANEL
Homocysteine: 9.6 umol/L (ref 0.0–19.2)
RPR Ser Ql: NONREACTIVE
TSH: 0.943 u[IU]/mL (ref 0.450–4.500)
Vitamin B-12: 340 pg/mL (ref 232–1245)

## 2020-04-05 LAB — SEDIMENTATION RATE: Sed Rate: 6 mm/h (ref 0–40)

## 2020-04-06 ENCOUNTER — Ambulatory Visit: Payer: Medicare Other | Admitting: Internal Medicine

## 2020-04-10 ENCOUNTER — Ambulatory Visit (INDEPENDENT_AMBULATORY_CARE_PROVIDER_SITE_OTHER): Payer: Medicare Other

## 2020-04-10 ENCOUNTER — Other Ambulatory Visit (HOSPITAL_COMMUNITY): Payer: Medicare Other

## 2020-04-10 ENCOUNTER — Other Ambulatory Visit: Payer: Self-pay

## 2020-04-10 ENCOUNTER — Ambulatory Visit: Payer: Self-pay

## 2020-04-10 ENCOUNTER — Other Ambulatory Visit (HOSPITAL_COMMUNITY)
Admission: RE | Admit: 2020-04-10 | Discharge: 2020-04-10 | Disposition: A | Discharge status: Home / Self Care | Payer: Medicare Other | Source: Ambulatory Visit | Attending: Adult Health | Admitting: Adult Health

## 2020-04-10 VITALS — BP 132/80 | HR 84

## 2020-04-10 DIAGNOSIS — Z20822 Contact with and (suspected) exposure to covid-19: Secondary | ICD-10-CM | POA: Diagnosis not present

## 2020-04-10 DIAGNOSIS — E538 Deficiency of other specified B group vitamins: Secondary | ICD-10-CM

## 2020-04-10 LAB — SARS CORONAVIRUS 2 (TAT 6-24 HRS): SARS Coronavirus 2: NEGATIVE

## 2020-04-10 MED ORDER — CYANOCOBALAMIN 1000 MCG/ML IJ SOLN
1000.0000 ug | Freq: Once | INTRAMUSCULAR | Status: AC
  Administered 2020-04-10: 1000 ug via INTRAMUSCULAR

## 2020-04-10 NOTE — Progress Notes (Signed)
Pt is here for b12

## 2020-04-10 NOTE — Progress Notes (Signed)
Kindly inform the patient that lab work for reversible causes of memory loss, sed rate and cholesterol were all satisfactory

## 2020-04-12 ENCOUNTER — Encounter: Payer: Self-pay | Admitting: *Deleted

## 2020-04-12 ENCOUNTER — Other Ambulatory Visit: Payer: Self-pay | Admitting: *Deleted

## 2020-04-12 DIAGNOSIS — H3581 Retinal edema: Secondary | ICD-10-CM | POA: Diagnosis not present

## 2020-04-12 DIAGNOSIS — H35371 Puckering of macula, right eye: Secondary | ICD-10-CM | POA: Diagnosis not present

## 2020-04-12 DIAGNOSIS — H2013 Chronic iridocyclitis, bilateral: Secondary | ICD-10-CM | POA: Diagnosis not present

## 2020-04-12 LAB — HM DIABETES EYE EXAM

## 2020-04-12 MED ORDER — ALPRAZOLAM 0.5 MG PO TABS: ORAL_TABLET | ORAL | 0 refills | Status: DC

## 2020-04-13 ENCOUNTER — Ambulatory Visit (INDEPENDENT_AMBULATORY_CARE_PROVIDER_SITE_OTHER): Payer: Medicare Other | Admitting: Adult Health

## 2020-04-13 ENCOUNTER — Encounter: Payer: Self-pay | Admitting: Adult Health

## 2020-04-13 ENCOUNTER — Ambulatory Visit (INDEPENDENT_AMBULATORY_CARE_PROVIDER_SITE_OTHER): Payer: Medicare Other | Admitting: Internal Medicine

## 2020-04-13 ENCOUNTER — Encounter: Payer: Medicare Other | Admitting: Adult Health

## 2020-04-13 ENCOUNTER — Other Ambulatory Visit: Payer: Self-pay

## 2020-04-13 VITALS — BP 118/78 | HR 63 | Temp 97.0°F | Ht 62.0 in | Wt 176.0 lb

## 2020-04-13 DIAGNOSIS — D869 Sarcoidosis, unspecified: Secondary | ICD-10-CM | POA: Diagnosis not present

## 2020-04-13 DIAGNOSIS — G4733 Obstructive sleep apnea (adult) (pediatric): Secondary | ICD-10-CM | POA: Diagnosis not present

## 2020-04-13 DIAGNOSIS — I25119 Atherosclerotic heart disease of native coronary artery with unspecified angina pectoris: Secondary | ICD-10-CM | POA: Diagnosis not present

## 2020-04-13 DIAGNOSIS — J449 Chronic obstructive pulmonary disease, unspecified: Secondary | ICD-10-CM

## 2020-04-13 DIAGNOSIS — R0602 Shortness of breath: Secondary | ICD-10-CM

## 2020-04-13 DIAGNOSIS — R918 Other nonspecific abnormal finding of lung field: Secondary | ICD-10-CM

## 2020-04-13 LAB — PULMONARY FUNCTION TEST
DL/VA % pred: 80 %
DL/VA: 3.36 ml/min/mmHg/L
DLCO cor % pred: 51 %
DLCO cor: 9.27 ml/min/mmHg
DLCO unc % pred: 51 %
DLCO unc: 9.27 ml/min/mmHg
FEF 25-75 Post: 1 L/sec
FEF 25-75 Pre: 1.05 L/sec
FEF2575-%Change-Post: -4 %
FEF2575-%Pred-Post: 67 %
FEF2575-%Pred-Pre: 70 %
FEV1-%Change-Post: 0 %
FEV1-%Pred-Post: 79 %
FEV1-%Pred-Pre: 79 %
FEV1-Post: 1.28 L
FEV1-Pre: 1.27 L
FEV1FVC-%Change-Post: 4 %
FEV1FVC-%Pred-Pre: 99 %
FEV6-%Change-Post: -2 %
FEV6-%Pred-Post: 79 %
FEV6-%Pred-Pre: 82 %
FEV6-Post: 1.59 L
FEV6-Pre: 1.64 L
FEV6FVC-%Change-Post: 0 %
FEV6FVC-%Pred-Post: 104 %
FEV6FVC-%Pred-Pre: 103 %
FVC-%Change-Post: -4 %
FVC-%Pred-Post: 76 %
FVC-%Pred-Pre: 79 %
FVC-Post: 1.59 L
FVC-Pre: 1.66 L
Post FEV1/FVC ratio: 81 %
Post FEV6/FVC ratio: 100 %
Pre FEV1/FVC ratio: 77 %
Pre FEV6/FVC Ratio: 99 %
RV % pred: 85 %
RV: 1.82 L
TLC % pred: 73 %
TLC: 3.52 L

## 2020-04-13 NOTE — Addendum Note (Signed)
Addended by: Birder Robson on: 04/13/2020 02:26 PM   Modules accepted: Orders

## 2020-04-13 NOTE — Assessment & Plan Note (Signed)
Chronic in nature.  Pulmonary function testing shows stable lung function.  Check high-resolution CT chest.

## 2020-04-13 NOTE — Progress Notes (Signed)
$'@Patient'V$  ID: Barrie Folk, female    DOB: 1945/04/05, 75 y.o.   MRN: [id number]  Chief Complaint  Patient presents with  . Follow-up    Referring provider: Glendale Chard, MD  HPI: 75 year old female former smoker followed for COPD with chronic bronchitis, sarcoidosis (pulmonary and ocular involvement), obstructive sleep apnea Medical history significant for diabetes, glaucoma, coronary artery disease  TEST/EVENTS :  NPSG 12/25/03- AHI  2.9/ hr, desaturation to 91%, body weight 164 lbs NPSG-10/18/13- Mild OSA, AHI 9.3/ hr, weight 164 lbs ACE 09/17/15-47-WNL ACE level 08/18/16-30 PFT: 01/07/2011-FEV1 1.59/79%, FEV1/FVC 0.73, FEF 25-75% improved to 38% with bronchodilator. TLC 90% DLCO 55% Office Spirometry 06/09/16- moderate restriction of exhaled volume. FVC 1.51/68%, FEV1 1.20/70%, ratio 0.79, FEF 25-75% 1.19/73% Nuclear Stress Test 09/04/16- EF 65%, low risk. ECHO 07/31/16- Gr 1 DD Walk test on room air 12/21/2017-minimal saturation 94%, maximum heart rate 118/minute with no stops walking 3 laps x180 m. 2D echo December 2020 EF 2595%, grade 1 diastolic dysfunction, normal RV SF.  RV size normal Chest x-ray November 2021 showed clear lungs, hyperinflation.  04/13/2020 Follow up: COPD with chronic bronchitis, sarcoidosis, OSA Patient returns for 71-month follow-up.  Patient has underlying COPD with chronic bronchitis, sarcoidosis and obstructive sleep apnea.  Patient says overall breathing has been doing the same with increased shortness of breath with minimal activities. Has chronic  Patient was given Trelegy last visit.  Says she was unable to tolerate with numerous side effects.   She had pulmonary function testing completed this morning that showed stable lung function with mild restriction FEV1 79%, ratio 81, FVC 76%, no significant bronchodilator response, DLCO slightly improved at 51%. Uses albuterol on occasion.  Patient is concerned that her pulmonary nodularity and sarcoid has  worsened as she had a CT thoracic spine that showed increased nodularity.  Patient has obstructive sleep apnea on CPAP at bedtime.  Patient says she wears her CPAP each night.  But has trouble with the mask.  CPAP download shows excellent compliance with 100% usage.  Daily average usage at 7 hours.  Patient is on CPAP 9 cm H2O.  AHI 0.7. Uses a full face mask. Feels rested but mask is irritating .   Has chronic neck and back pain. Undergoing  MRI of cervical spine and brain on 04/17/20.   Smoked 10 yr total 1-1.5PPD .   Allergies  Allergen Reactions  . Crestor [Rosuvastatin Calcium] Other (See Comments)    muscle aches  . Demerol  [Meperidine Hcl]     Other reaction(s): Hallucinations  . Shellfish Allergy Itching and Other (See Comments)    Crab, shrimp and lobster ---lips itch and tingle Was told not to eat again after having a allergy test. Lobster, crab and shrimp     Immunization History  Administered Date(s) Administered  . Fluad Quad(high Dose 65+) 12/30/2019  . Influenza Split 10/31/2013  . Influenza Whole 12/23/2017  . Influenza, High Dose Seasonal PF 11/18/2016, 12/21/2017, 11/23/2018  . Influenza,inj,Quad PF,6+ Mos 02/25/2013, 03/19/2015  . Influenza,inj,quad, With Preservative 11/11/2018  . PFIZER(Purple Top)SARS-COV-2 Vaccination 03/01/2019, 03/22/2019, 11/22/2019  . Pneumococcal Polysaccharide-23 12/21/2017  . Zoster Recombinat (Shingrix) 12/25/2017    Past Medical History:  Diagnosis Date  . Anemia    3 months ago anemic  . Anxiety    on meds  . Arthritis    "all over" (01/15/2017)  . Asthma   . Bronchitis with emphysema   . Chest pain   . Chronic bronchitis ([place])   .  Coronary artery disease    a. 01/2017 she underwent orbital atherectomy/DES to the proxmal LAD and PTCA to ostial D2. 2D Echo 01/15/17 showed mild LVH, EF 60-65%, grade 1 DD.  . Family history of anesthesia complication    daughter N/V  . Fibromyalgia   . GERD (gastroesophageal reflux  disease)    on meds  . Glaucoma   . Heart murmur   . History of hiatal hernia   . Hx of echocardiogram    Echo (03/2013):  Tech limited; Mild focal basal septal hypertrophy, EF 60-65%, normal RVF  . Hyperlipidemia   . Hypertension   . Nonsustained ventricular tachycardia ([place])   . OSA on CPAP   . Premature atrial contractions   . PVC (premature ventricular contraction)    a. Holter 12/16: NSR, occ PAC,PVCs  . Sarcoidosis   . Type II diabetes mellitus ([facility])     Tobacco History: Social History   Tobacco Use  Smoking Status Former Smoker  . Packs/day: 1.00  . Years: 4.00  . Pack years: 4.00  . Types: Cigarettes  . Quit date: 02/11/1980  . Years since quitting: 40.1  Smokeless Tobacco Never Used   Counseling given: Not Answered   Outpatient Medications Prior to Visit  Medication Sig Dispense Refill  . acetaminophen (TYLENOL) 500 MG tablet Take 1,000 mg by mouth 2 (two) times daily as needed for moderate pain or headache.    . albuterol (PROVENTIL) (2.5 MG/3ML) 0.083% nebulizer solution Take 3 mLs (2.5 mg total) by nebulization daily as needed for wheezing or shortness of breath. 75 mL 12  . albuterol (VENTOLIN HFA) 108 (90 Base) MCG/ACT inhaler Inhale 2 puffs into the lungs every 6 (six) hours as needed for wheezing or shortness of breath. 18 g 12  . ALPRAZolam (XANAX) 0.5 MG tablet For sedation before MRI scan; take 1 tab 1 hour before scan; may repeat 1 tab 15 min before scan 2 tablet 0  . aspirin EC 81 MG tablet Take 81 mg by mouth daily. Swallow whole.    [name] atorvastatin (LIPITOR) 80 MG tablet TAKE 1 TABLET(80 MG) BY MOUTH DAILY (Patient taking differently: Take 80 mg by mouth at bedtime.) 90 tablet 2  . Blood Glucose Monitoring Suppl (ACCU-CHEK AVIVA PLUS) w/Device KIT Use to check blood sugars 3 times a day. Dx code e11.65 1 kit 3  . brimonidine (ALPHAGAN P) 0.1 % SOLN Place 1 drop into both eyes 2 (two) times daily.     . brinzolamide (AZOPT) 1 % ophthalmic suspension Place  1 drop into both eyes 3 (three) times daily.    . carvedilol (COREG) 12.5 MG tablet TAKE 1 TABLET(12.5 MG) BY MOUTH TWICE DAILY (Patient taking differently: Take 12.5 mg by mouth in the morning and at bedtime.) 180 tablet 3  . clopidogrel (PLAVIX) 75 MG tablet Take 1 tablet (75 mg total) by mouth daily. 90 tablet 3  . isosorbide mononitrate (IMDUR) 60 MG 24 hr tablet TAKE 1 TABLET(60 MG) BY MOUTH DAILY (Patient taking differently: Take 60 mg by mouth daily.) 90 tablet 3  . JANUMET 50-500 MG tablet TAKE 1 TABLET BY MOUTH TWICE DAILY WITH A MEAL 180 tablet 1  . Lancets (ONETOUCH DELICA PLUS [id number]) MISC CHECK BLOOD SUGAR BEFORE BREAKFAST AND DINNER 100 each 1  . Misc Natural Products (FOCUSED MIND PO) Take 4 tablets by mouth daily. For memory    . montelukast (SINGULAIR) 10 MG tablet TAKE 1 TABLET(10 MG) BY MOUTH EVERY EVENING (Patient taking differently: Take  10 mg by mouth at bedtime.) 90 tablet 1  . Nebulizers (COMPRESSOR/NEBULIZER) MISC Use as directed 1 each 0  . nitroGLYCERIN (NITROSTAT) 0.4 MG SL tablet Place 1 tablet (0.4 mg total) under the tongue every 5 (five) minutes as needed for chest pain. 25 tablet prn  . pantoprazole (PROTONIX) 40 MG tablet TAKE 1 TABLET(40 MG) BY MOUTH DAILY (Patient taking differently: Take 40 mg by mouth daily before breakfast.) 30 tablet 11  . Polyvinyl Alcohol-Povidone PF (REFRESH) 1.4-0.6 % SOLN Place 1-2 drops into both eyes 3 (three) times daily as needed (dry/irritated eyes.).    [name] potassium chloride SA (KLOR-CON) 20 MEQ tablet Take 1 tablet (20 mEq total) by mouth every other day. (Patient taking differently: Take 20 mEq by mouth every 3 (three) days.) 90 tablet 1  . prednisoLONE acetate (PRED FORTE) 1 % ophthalmic suspension Place 1 drop into both eyes 4 (four) times daily.     . pregabalin (LYRICA) 75 MG capsule TAKE 1 CAPSULE(75 MG) BY MOUTH TWICE DAILY 180 capsule 1  . Travoprost, BAK Free, (TRAVATAN) 0.004 % SOLN ophthalmic solution Place 1 drop into  the right eye at bedtime.     . Fluticasone-Umeclidin-Vilant (TRELEGY ELLIPTA) 100-62.5-25 MCG/INH AEPB Inhale 1 puff into the lungs daily. (Patient not taking: Reported on 04/13/2020) 60 each 5  . furosemide (LASIX) 40 MG tablet Take 1 tablet (40 mg total) by mouth every other day. (Patient taking differently: Take 40 mg by mouth every 3 (three) days.) 90 tablet 1   No facility-administered medications prior to visit.     Review of Systems:   Constitutional:   No  weight loss, night sweats,  Fevers, chills,  +fatigue, or  lassitude.  HEENT:   No headaches,  Difficulty swallowing,  Tooth/dental problems, or  Sore throat,                No sneezing, itching, ear ache, nasal congestion, post nasal drip,   CV:  No chest pain,  Orthopnea, PND, swelling in lower extremities, anasarca, dizziness, palpitations, syncope.   GI  No heartburn, indigestion, abdominal pain, nausea, vomiting, diarrhea, change in bowel habits, loss of appetite, bloody stools.   Resp: No shortness of breath with exertion or at rest.  No excess mucus, no productive cough,  No non-productive cough,  No coughing up of blood.  No change in color of mucus.  No wheezing.  No chest wall deformity  Skin: no rash or lesions.  GU: no dysuria, change in color of urine, no urgency or frequency.  No flank pain, no hematuria   MS:  No joint pain or swelling.  No decreased range of motion.      Physical Exam  BP 118/78 (BP Location: Left Arm, Cuff Size: Normal)   Pulse 63   Temp (!) 97 F (36.1 C)   Ht 5' 2"  (1.575 m)   Wt 176 lb (79.8 kg)   SpO2 99%   BMI 32.19 kg/m   GEN: A/Ox3; pleasant , NAD, well nourished    HEENT:  [place]/AT,   , NOSE-clear, THROAT-clear, no lesions, no postnasal drip or exudate noted.  Class III MP airway  NECK:  Supple w/ fair ROM; no JVD; normal carotid impulses w/o bruits; no thyromegaly or nodules palpated; no lymphadenopathy.    RESP  Clear  P & A; w/o, wheezes/ rales/ or rhonchi. no  accessory muscle use, no dullness to percussion  CARD:  RRR, no m/r/g, no peripheral edema, pulses intact, no cyanosis  or clubbing.  GI:   Soft & nt; nml bowel sounds; no organomegaly or masses detected.   Musco: Warm bil, no deformities or joint swelling noted.   Neuro: alert, no focal deficits noted.    Skin: Warm, no lesions or rashes       Imaging: No results found.  PFT Results Latest Ref Rng & Units 04/13/2020 11/25/2016  FVC-Pre L 1.66 1.70  FVC-Predicted Pre % 79 78  FVC-Post L 1.59 1.69  FVC-Predicted Post % 76 78  Pre FEV1/FVC % % 77 81  Post FEV1/FCV % % 81 83  FEV1-Pre L 1.27 1.39  FEV1-Predicted Pre % 79 82  FEV1-Post L 1.28 1.40  DLCO uncorrected ml/min/mmHg 9.27 9.95  DLCO UNC% % 51 46  DLCO corrected ml/min/mmHg 9.27 10.18  DLCO COR %Predicted % 51 47  DLVA Predicted % 80 88  TLC L 3.52 3.29  TLC % Predicted % 73 69  RV % Predicted % 85 75    No results found for: NITRICOXIDE      Assessment & Plan:   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) ([place]) Patient has minimum COPD.  PFTs do not show significant airflow obstruction or restriction. She does have a diffusing defect.  Which may be related to her underlying sarcoidosis with pulmonary nodularity. She had no significant benefit with Trelegy and had multiple side effects.  Just uses albuterol on a as needed basis.  Hold on additional maintenance medications at this time.  She has multiple comorbidities .  She has no significant cough or wheezing.  But has chronic dyspnea which may be component of physical deconditioning.  Will refer to pulmonary rehab  Plan  Patient Instructions  Continue on CPAP at bedtime .  Wear all night long .  Try Dream wear full face mask .  Do not drive if sleepy .  Work on healthy weight .  Refer to pulmonary rehab.  Albuterol inhaler or neb As needed   Activity as tolerated. Set up HRCT chest -Sarcoid .  Follow up with Dr. Annamaria Boots  As  planned next month and As needed          Sarcoidosis Sarcoidosis with ocular involvement.  Has pulmonary nodularity on x-ray and a moderate to severe diffusing defect. We will check a high-resolution CT chest-looking for possible ILD component..  She has associated physical deconditioning.  Refer to pulmonary rehab.  Shortness of breath Chronic in nature.  Pulmonary function testing shows stable lung function.  Check high-resolution CT chest.      Rexene Edison, NP 04/13/2020

## 2020-04-13 NOTE — Assessment & Plan Note (Signed)
Sarcoidosis with ocular involvement.  Has pulmonary nodularity on x-ray and a moderate to severe diffusing defect. We will check a high-resolution CT chest-looking for possible ILD component..  She has associated physical deconditioning.  Refer to pulmonary rehab.

## 2020-04-13 NOTE — Addendum Note (Signed)
Addended by: Birder Robson on: 04/13/2020 11:57 AM   Modules accepted: Orders

## 2020-04-13 NOTE — Progress Notes (Signed)
PFT done today. 

## 2020-04-13 NOTE — Assessment & Plan Note (Signed)
Patient has minimum COPD.  PFTs do not show significant airflow obstruction or restriction. She does have a diffusing defect.  Which may be related to her underlying sarcoidosis with pulmonary nodularity. She had no significant benefit with Trelegy and had multiple side effects.  Just uses albuterol on a as needed basis.  Hold on additional maintenance medications at this time.  She has multiple comorbidities .  She has no significant cough or wheezing.  But has chronic dyspnea which may be component of physical deconditioning.  Will refer to pulmonary rehab  Plan  Patient Instructions  Continue on CPAP at bedtime .  Wear all night long .  Try Dream wear full face mask .  Do not drive if sleepy .  Work on healthy weight .  Refer to pulmonary rehab.  Albuterol inhaler or neb As needed   Activity as tolerated. Set up HRCT chest -Sarcoid .  Follow up with Dr. Annamaria Boots  As planned next month and As needed

## 2020-04-13 NOTE — Patient Instructions (Addendum)
Continue on CPAP at bedtime .  Wear all night long .  Try Dream wear full face mask .  Do not drive if sleepy .  Work on healthy weight .  Refer to pulmonary rehab.  Albuterol inhaler or neb As needed   Activity as tolerated. Set up HRCT chest -Sarcoid .  Follow up with Dr. Annamaria Boots  As planned next month and As needed

## 2020-04-16 DIAGNOSIS — I110 Hypertensive heart disease with heart failure: Secondary | ICD-10-CM | POA: Insufficient documentation

## 2020-04-16 NOTE — Progress Notes (Signed)
Cardiology Office Note   Date:  04/19/2020   ID:  Kelsha, Older [birth year], MRN [id number]  PCP:  Glendale Chard, MD  Cardiologist:  Dr. Cathie Olden, MD   Chief Complaint  Patient presents with  . Follow-up    History of Present Illness: SHINEKA AUBLE is a 75 y.o. female who presents for follow-up, seen for Dr. Acie Fredrickson.  Ms. 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  Ms. Washington underwent Ogema in 2018 for angina at which time she underwent orbital arthrectomy with PCI/DES to the proximal/mid LAD.  Echocardiogram from 01/2019 with normal LV EF and G1 DD.  She had a venous duplex study which was found to be normal.  Previously followed with podiatry who advised a venogram to be performed.  She was most recently seen by Dr. Acie Fredrickson 12/2019 at which time she had complaints of palpitation with mild chest discomfort.  Symptoms would last throughout the day with no exacerbating factors.  Plan was for an event monitor for more definitive assessment.  Due to chest pain symptoms, Lexiscan stress test was performed.  Stress test performed 12/20/2019 that showed an LVEF at 67% with no ST segment deviation, no ischemia or infarct noted consider low risk study.  Cardiac monitor from 12/27/2019 with normal sinus rhythm, rare PVCs and no evidence of VT which was reassuring.  Today she states that she is doing ok however has some issues with LE edema. She was previously followed by Dr. Donnetta Hutching who felt this was secondary to lymphedema and she has follow up next week with them. She also describes intermittent palpitations which come on randomly and last several minutes then resolve on its own. She denies chest pain or other anginal symptoms. She recently underwent stress testing that was reassuring as above. She recently lost her sister and is  leaving today to go to the funeral.    Past Medical History:  Diagnosis Date  . Anemia    3 months ago anemic  . Anxiety    on meds  . Arthritis    "all over" (01/15/2017)  . Asthma   . Bronchitis with emphysema   . Chest pain   . Chronic bronchitis ([place])   . Coronary artery disease    a. 01/2017 she underwent orbital atherectomy/DES to the proxmal LAD and PTCA to ostial D2. 2D Echo 01/15/17 showed mild LVH, EF 60-65%, grade 1 DD.  . Family history of anesthesia complication    daughter N/V  . Fibromyalgia   . GERD (gastroesophageal reflux disease)    on meds  . Glaucoma   . Heart murmur   . History of hiatal hernia   . Hx of echocardiogram    Echo (03/2013):  Tech limited; Mild focal basal septal hypertrophy, EF 60-65%, normal RVF  . Hyperlipidemia   . Hypertension   . Nonsustained ventricular tachycardia ([place])   . OSA on CPAP   . Premature atrial contractions   . PVC (premature ventricular contraction)    a. Holter 12/16: NSR, occ PAC,PVCs  . Sarcoidosis   . Type II diabetes mellitus ([place])     Past Surgical History:  Procedure Laterality Date  . BREAST SURGERY    . CARDIAC CATHETERIZATION  05/04/2007   reveals overall normal left ventricular systolic function. Ejection fraction 65-70%  . CATARACT EXTRACTION W/PHACO Right 07/20/2013  Procedure: CATARACT EXTRACTION PHACO AND INTRAOCULAR LENS PLACEMENT (IOC);  Surgeon: Marylynn Pearson, MD;  Location: [facility];  Service: Ophthalmology;  Laterality: Right;  . COLONOSCOPY W/ BIOPSIES AND POLYPECTOMY    . COLONOSCOPY WITH PROPOFOL N/A 09/07/2014   Procedure: COLONOSCOPY WITH PROPOFOL;  Surgeon: Juanita Craver, MD;  Location: [facility];  Service: Endoscopy;  Laterality: N/A;  . COLONOSCOPY WITH PROPOFOL N/A 01/10/2020   Procedure: COLONOSCOPY WITH PROPOFOL;  Surgeon: Juanita Craver, MD;  Location: [facility];  Service: Endoscopy;  Laterality: N/A;  . CORONARY ANGIOPLASTY WITH STENT PLACEMENT  01/15/2017  . CORONARY ATHERECTOMY N/A  01/15/2017   Procedure: CORONARY ATHERECTOMY;  Surgeon: Nelva Bush, MD;  Location: [facility];  Service: Cardiovascular;  Laterality: N/A;  . CORONARY BALLOON ANGIOPLASTY N/A 01/15/2017   Procedure: CORONARY BALLOON ANGIOPLASTY;  Surgeon: Nelva Bush, MD;  Location: [facility];  Service: Cardiovascular;  Laterality: N/A;  . CORONARY STENT INTERVENTION N/A 01/15/2017   Procedure: CORONARY STENT INTERVENTION;  Surgeon: Nelva Bush, MD;  Location: [facility] [facility];  Service: Cardiovascular;  Laterality: N/A;  . ESOPHAGOGASTRODUODENOSCOPY (EGD) WITH PROPOFOL N/A 09/07/2014   Procedure: ESOPHAGOGASTRODUODENOSCOPY (EGD) WITH PROPOFOL;  Surgeon: Juanita Craver, MD;  [facility];  Service: Endoscopy;  Laterality: N/A;  . EXTERNAL EAR SURGERY Bilateral 1970s   tumors removed  . EYE SURGERY Left 2019   cataract extraction   . EYE SURGERY Right 02/09/2018   eyelid surgery   . INTRAVASCULAR PRESSURE WIRE/FFR STUDY N/A 12/12/2016   Procedure: INTRAVASCULAR PRESSURE WIRE/FFR STUDY;  Surgeon: Nelva Bush, MD;  Location: [facility];  Service: Cardiovascular;  Laterality: N/A;  . MINI SHUNT INSERTION Right 07/20/2013   Procedure: INSERTION OF GLAUCOMA FILTRATION DEVICE RIGHT EYE;  Surgeon: Marylynn Pearson, MD;  Location: [facility] [facility];  Service: Ophthalmology;  Laterality: Right;  . MITOMYCIN C APPLICATION Right 06/12/7739   Procedure: MITOMYCIN C APPLICATION;  Surgeon: Marylynn Pearson, MD;  Location: [facility];  Service: Ophthalmology;  Laterality: Right;  . MITOMYCIN C APPLICATION Right 2/87/8676   Procedure: MITOMYCIN C APPLICATION RIGHT EYE;  Surgeon: Marylynn Pearson, MD;  Location: [facility];  Service: Ophthalmology;  Laterality: Right;  . PLACEMENT OF BREAST IMPLANTS Bilateral 1992   "took all my breast tissue out; put implants in;fibrocystic breast disease "  . POLYPECTOMY  01/10/2020   Procedure: POLYPECTOMY;  Surgeon: Juanita Craver, MD;  [facility];  Service:  Endoscopy;;  . RIGHT/LEFT HEART CATH AND CORONARY ANGIOGRAPHY N/A 12/12/2016   Procedure: RIGHT/LEFT HEART CATH AND CORONARY ANGIOGRAPHY;  Surgeon: Nelva Bush, MD;  Location: [facility];  Service: Cardiovascular;  Laterality: N/A;  . TONSILLECTOMY    . TOTAL ABDOMINAL HYSTERECTOMY  1982  . TRABECULECTOMY Right 02/21/2015   Procedure: TRABECULECTOMY WITH [facility] ON THE RIGHT EYE;  Surgeon: Marylynn Pearson, MD;  Location: [facility];  Service: Ophthalmology;  Laterality: Right;     Current Outpatient Medications  Medication Sig Dispense Refill  . acetaminophen (TYLENOL) 500 MG tablet Take 1,000 mg by mouth 2 (two) times daily as needed for moderate pain or headache.    . albuterol (PROVENTIL) (2.5 MG/3ML) 0.083% nebulizer solution Take 3 mLs (2.5 mg total) by nebulization daily as needed for wheezing or shortness of breath. 75 mL 12  . albuterol (VENTOLIN HFA) 108 (90 Base) MCG/ACT inhaler Inhale 2 puffs into the lungs every 6 (six) hours as needed for wheezing or shortness of breath. 18 g 12  . ALPRAZolam (XANAX) 0.5 MG tablet For sedation  before MRI scan; take 1 tab 1 hour before scan; may repeat 1 tab 15 min before scan 2 tablet 0  . aspirin EC 81 MG tablet Take 81 mg by mouth daily. Swallow whole.    [name] atorvastatin (LIPITOR) 80 MG tablet TAKE 1 TABLET(80 MG) BY MOUTH DAILY 90 tablet 2  . Blood Glucose Monitoring Suppl (ACCU-CHEK AVIVA PLUS) w/Device KIT Use to check blood sugars 3 times a day. Dx code e11.65 1 kit 3  . brimonidine (ALPHAGAN P) 0.1 % SOLN Place 1 drop into both eyes 2 (two) times daily.     . brinzolamide (AZOPT) 1 % ophthalmic suspension Place 1 drop into both eyes 3 (three) times daily.    . carvedilol (COREG) 12.5 MG tablet TAKE 1 TABLET(12.5 MG) BY MOUTH TWICE DAILY 180 tablet 3  . clopidogrel (PLAVIX) 75 MG tablet Take 1 tablet (75 mg total) by mouth daily. 90 tablet 3  . Fluticasone-Umeclidin-Vilant (TRELEGY ELLIPTA) 100-62.5-25 MCG/INH AEPB Inhale 1 puff into the lungs  daily. 60 each 5  . furosemide (LASIX) 40 MG tablet Take 1 tablet (40 mg total) by mouth every other day. 90 tablet 1  . isosorbide mononitrate (IMDUR) 60 MG 24 hr tablet TAKE 1 TABLET(60 MG) BY MOUTH DAILY 90 tablet 3  . JANUMET 50-500 MG tablet TAKE 1 TABLET BY MOUTH TWICE DAILY WITH A MEAL 180 tablet 1  . Lancets (ONETOUCH DELICA PLUS [id number]) MISC CHECK BLOOD SUGAR BEFORE BREAKFAST AND DINNER 100 each 1  . metoprolol tartrate (LOPRESSOR) 25 MG tablet Take 0.5-1 tablets (12.5-25 mg total) by mouth as needed (PALPITATIONS). 90 tablet 2  . Misc Natural Products (FOCUSED MIND PO) Take 4 tablets by mouth daily. For memory    . montelukast (SINGULAIR) 10 MG tablet TAKE 1 TABLET(10 MG) BY MOUTH EVERY EVENING 90 tablet 1  . Nebulizers (COMPRESSOR/NEBULIZER) MISC Use as directed 1 each 0  . nitroGLYCERIN (NITROSTAT) 0.4 MG SL tablet Place 1 tablet (0.4 mg total) under the tongue every 5 (five) minutes as needed for chest pain. 25 tablet prn  . pantoprazole (PROTONIX) 40 MG tablet TAKE 1 TABLET(40 MG) BY MOUTH DAILY 30 tablet 11  . Polyvinyl Alcohol-Povidone PF (REFRESH) 1.4-0.6 % SOLN Place 1-2 drops into both eyes 3 (three) times daily as needed (dry/irritated eyes.).    [name] potassium chloride SA (KLOR-CON) 20 MEQ tablet Take 1 tablet (20 mEq total) by mouth every other day. 90 tablet 1  . prednisoLONE acetate (PRED FORTE) 1 % ophthalmic suspension Place 1 drop into both eyes 4 (four) times daily.     . pregabalin (LYRICA) 75 MG capsule TAKE 1 CAPSULE(75 MG) BY MOUTH TWICE DAILY 180 capsule 1  . Travoprost, BAK Free, (TRAVATAN) 0.004 % SOLN ophthalmic solution Place 1 drop into the right eye at bedtime.      No current facility-administered medications for this visit.    Allergies:   Crestor [rosuvastatin calcium], Demerol  [meperidine hcl], and Shellfish allergy    Social History:  The patient  reports that she quit smoking about 40 years ago. Her smoking use included cigarettes. She has a 4.00  pack-year smoking history. She has never used smokeless tobacco. She reports that she does not drink alcohol and does not use drugs.   Family History:  The patient's family history includes Cancer in her mother and paternal grandmother; Diabetes in her brother and father; Glaucoma in her brother and father; Heart disease in her father and paternal grandmother.    ROS:  Please see the history of present illness.   Otherwise, review of systems are positive for none. All other systems are reviewed and negative.    PHYSICAL EXAM: VS:  BP 120/80 (BP Location: Left Arm, Patient Position: Sitting, Cuff Size: Normal)   Pulse 72   Ht 5' 2"  (1.575 m)   Wt 175 lb (79.4 kg)   SpO2 99%   BMI 32.01 kg/m  , BMI Body mass index is 32.01 kg/m.   General: Well developed, well nourished, NAD Lungs:Clear to ausculation bilaterally. No wheezes, rales, or rhonchi. Breathing is unlabored. Cardiovascular: RRR with S1 S2. No murmurs Extremities: 2+ BLE edema Neuro: Alert and oriented.   EKG:  EKG is not ordered today.  Recent Labs: 08/02/2019: Hemoglobin 11.4; Platelets 223 11/28/2019: BNP 19.2; Magnesium 1.7 03/28/2020: BUN 11; Creatinine, Ser 0.76; Potassium 4.3; Sodium 142 04/03/2020: TSH 0.943    Lipid Panel    Component Value Date/Time   CHOL 152 04/03/2020 1337   TRIG 104 04/03/2020 1337   HDL 44 04/03/2020 1337   CHOLHDL 3.5 04/03/2020 1337   CHOLHDL 3 05/24/2014 1650   VLDL 20.8 05/24/2014 1650   LDLCALC 89 04/03/2020 1337     Wt Readings from Last 3 Encounters:  04/19/20 175 lb (79.4 kg)  04/13/20 176 lb (79.8 kg)  04/03/20 174 lb 6.4 oz (79.1 kg)     Other studies Reviewed: Additional studies/ records that were reviewed today include:  Review of the above records demonstrates:   ASSESSMENT AND PLAN:  1.  CAD s/p DES/PCI 2018: -Denies anginal symptoms -Continue ASA, Plavix -Recent Lexiscan stress test with no evidence of ischemia or infarct  2.  HLD: -Last LDL, 89 on  04/03/20 -Continue atorvastatin -We discussed that she is not currently at LDL goal of <70. She would Iike to continue with diet and lifestyle modifications for now.   3.  History of nonsustained VT: -Recent event monitor with predominantly NSR, rare PVCs and no NSVT -Unclear etiology of her recent palpitations -She deferred up titrating carvedilol however agreed to include taking an additional PRN BB for sustained palpitations.   4.  Sarcoidosis: -Pulmonary? -Follows with Dr. Annamaria Boots  5.  Venous stasis/lymphema: -Follows with Dr. Early>>>per chart review, felt as though her swelling is secondary to lymphedema.  -Will trial increasing her Lasix from every other day to daily for three days next week after her return from out of town. She will call and inform our team if there is improvement. She is to take her K+ supplement with the lasix.    Current medicines are reviewed at length with the patient today.  The patient does not have concerns regarding medicines.  The following changes have been made:  no change  Labs/ tests ordered today include: None  No orders of the defined types were placed in this encounter.    Disposition:   FU with Dr. Cathie Olden in 8 weeks  Signed, Kathyrn Drown, NP  04/19/2020 12:41 PM    [address], [place], [place]  [address] Phone: [phone]; Fax: [phone]

## 2020-04-17 ENCOUNTER — Ambulatory Visit
Admission: RE | Admit: 2020-04-17 | Discharge: 2020-04-17 | Disposition: A | Discharge status: Home / Self Care | Payer: Medicare Other | Source: Ambulatory Visit | Attending: Neurology | Admitting: Neurology

## 2020-04-17 ENCOUNTER — Other Ambulatory Visit: Payer: Self-pay

## 2020-04-17 ENCOUNTER — Ambulatory Visit: Payer: Self-pay

## 2020-04-17 DIAGNOSIS — I69398 Other sequelae of cerebral infarction: Secondary | ICD-10-CM

## 2020-04-17 DIAGNOSIS — R2689 Other abnormalities of gait and mobility: Secondary | ICD-10-CM

## 2020-04-17 MED ORDER — GADOBENATE DIMEGLUMINE 529 MG/ML IV SOLN
16.0000 mL | Freq: Once | INTRAVENOUS | Status: AC | PRN
  Administered 2020-04-17: 16 mL via INTRAVENOUS

## 2020-04-19 ENCOUNTER — Encounter (HOSPITAL_COMMUNITY): Payer: Self-pay | Admitting: *Deleted

## 2020-04-19 ENCOUNTER — Encounter: Payer: Self-pay | Admitting: Cardiology

## 2020-04-19 ENCOUNTER — Ambulatory Visit (INDEPENDENT_AMBULATORY_CARE_PROVIDER_SITE_OTHER): Payer: Medicare Other | Admitting: Cardiology

## 2020-04-19 ENCOUNTER — Other Ambulatory Visit: Payer: Self-pay

## 2020-04-19 VITALS — BP 120/80 | HR 72 | Ht 62.0 in | Wt 175.0 lb

## 2020-04-19 DIAGNOSIS — I472 Ventricular tachycardia: Secondary | ICD-10-CM

## 2020-04-19 DIAGNOSIS — E7800 Pure hypercholesterolemia, unspecified: Secondary | ICD-10-CM | POA: Diagnosis not present

## 2020-04-19 DIAGNOSIS — I4729 Other ventricular tachycardia: Secondary | ICD-10-CM

## 2020-04-19 DIAGNOSIS — I493 Ventricular premature depolarization: Secondary | ICD-10-CM

## 2020-04-19 DIAGNOSIS — R002 Palpitations: Secondary | ICD-10-CM | POA: Diagnosis not present

## 2020-04-19 DIAGNOSIS — I5032 Chronic diastolic (congestive) heart failure: Secondary | ICD-10-CM | POA: Diagnosis not present

## 2020-04-19 DIAGNOSIS — I739 Peripheral vascular disease, unspecified: Secondary | ICD-10-CM | POA: Diagnosis not present

## 2020-04-19 DIAGNOSIS — I10 Essential (primary) hypertension: Secondary | ICD-10-CM | POA: Diagnosis not present

## 2020-04-19 DIAGNOSIS — I25119 Atherosclerotic heart disease of native coronary artery with unspecified angina pectoris: Secondary | ICD-10-CM | POA: Diagnosis not present

## 2020-04-19 MED ORDER — METOPROLOL TARTRATE 25 MG PO TABS: 12.5000 mg | ORAL_TABLET | ORAL | 2 refills | Status: DC | PRN

## 2020-04-19 NOTE — Progress Notes (Signed)
Received referral from Dr Annamaria Boots for this pt to participate in pulmonary rehab with the the diagnosis of Sarcoidosis of the Lung. Clinical review of pt follow up appt on 3/4 Pulmonary, Cardiology and neurology office note.  Pt with Covid Risk Score - 8. Pt appropriate for scheduling for Pulmonary rehab.  Will forward to support staff for scheduling and verification of insurance eligibility/benefits with pt consent. Cherre Huger, BSN Cardiac and Training and development officer

## 2020-04-19 NOTE — Progress Notes (Signed)
Can inform the patient that MRI scan of the brain shows old small stroke in the deep portion of the brain on the left and mild age-related changes of hardening of the arteries.  No new or worrisome finding

## 2020-04-19 NOTE — Progress Notes (Signed)
Kindly inform the patient that MRI scan of the cervical spine showed mild bulging disc between the third and seventh vertebrae but no major compression or nerve impingement to worry about.

## 2020-04-19 NOTE — Patient Instructions (Addendum)
Medication Instructions:  Your physician has recommended you make the following change in your medication:  1. START METOPROLOL 12.5 MG - 25 MG AS NEEDED PALPITATIONS   2. DO A TRIAL RUN ON TAKING YOUR LASIX 3 DAYS IN A ROW WITH YOUR POTASSIUM.  *If you need a refill on your cardiac medications before your next appointment, please call your pharmacy*   Lab Work: NONE If you have labs (blood work) drawn today and your tests are completely normal, you will receive your results only by: [name] MyChart Message (if you have MyChart) OR . A paper copy in the mail If you have any lab test that is abnormal or we need to change your treatment, we will call you to review the results.   Testing/Procedures: NONE   Follow-Up: At [facility], you and your health needs are our priority.  As part of our continuing mission to provide you with exceptional heart care, we have created designated Provider Care Teams.  These Care Teams include your primary Cardiologist (physician) and Advanced Practice Providers (APPs -  Physician Assistants and Nurse Practitioners) who all work together to provide you with the care you need, when you need it.  We recommend signing up for the patient portal called "MyChart".  Sign up information is provided on this After Visit Summary.  MyChart is used to connect with patients for Virtual Visits (Telemedicine).  Patients are able to view lab/test results, encounter notes, upcoming appointments, etc.  Non-urgent messages can be sent to your provider as well.   To learn more about what you can do with MyChart, go to [email or web address].    Your next appointment:   8 week(s)  The format for your next appointment:   In Person  Provider:   You may see Mertie Moores, MD or one of the following Advanced Practice Providers on your designated Care Team:    Richardson Dopp, PA-C  [place], [place]

## 2020-04-23 ENCOUNTER — Other Ambulatory Visit: Payer: Self-pay

## 2020-04-23 DIAGNOSIS — J449 Chronic obstructive pulmonary disease, unspecified: Secondary | ICD-10-CM

## 2020-04-23 DIAGNOSIS — R600 Localized edema: Secondary | ICD-10-CM

## 2020-04-23 NOTE — Progress Notes (Unsigned)
ulmonary Asencion Noble

## 2020-04-24 ENCOUNTER — Ambulatory Visit (INDEPENDENT_AMBULATORY_CARE_PROVIDER_SITE_OTHER): Payer: Medicare Other

## 2020-04-24 ENCOUNTER — Other Ambulatory Visit: Payer: Self-pay

## 2020-04-24 ENCOUNTER — Telehealth: Payer: Self-pay

## 2020-04-24 VITALS — BP 138/68 | HR 79 | Temp 97.9°F | Ht 62.0 in | Wt 174.2 lb

## 2020-04-24 DIAGNOSIS — E538 Deficiency of other specified B group vitamins: Secondary | ICD-10-CM | POA: Diagnosis not present

## 2020-04-24 MED ORDER — CYANOCOBALAMIN 1000 MCG/ML IJ SOLN
1000.0000 ug | Freq: Once | INTRAMUSCULAR | Status: AC
  Administered 2020-04-24: 1000 ug via INTRAMUSCULAR

## 2020-04-24 NOTE — Telephone Encounter (Signed)
-----   Message from Garvin Fila, MD sent at 04/19/2020  4:56 PM EST ----- Can inform the patient that MRI scan of the brain shows old small stroke in the deep portion of the brain on the left and mild age-related changes of hardening of the arteries.  No new or worrisome finding

## 2020-04-24 NOTE — Progress Notes (Signed)
Pt is here for b12 shot.

## 2020-04-24 NOTE — Telephone Encounter (Signed)
I called patient.  I discussed her MRI brain and cervical results.  I reminded her of her EEG appointment tomorrow.  Patient verbalized understanding of results.  Patient had no questions or concerns.

## 2020-04-24 NOTE — Telephone Encounter (Signed)
-----   Message from Garvin Fila, MD sent at 04/19/2020  4:56 PM EST ----- Kindly inform the patient that MRI scan of the cervical spine showed mild bulging disc between the third and seventh vertebrae but no major compression or nerve impingement to worry about.

## 2020-04-25 ENCOUNTER — Ambulatory Visit (INDEPENDENT_AMBULATORY_CARE_PROVIDER_SITE_OTHER): Payer: Medicare Other | Admitting: Neurology

## 2020-04-25 DIAGNOSIS — R410 Disorientation, unspecified: Secondary | ICD-10-CM | POA: Diagnosis not present

## 2020-04-25 DIAGNOSIS — R413 Other amnesia: Secondary | ICD-10-CM

## 2020-04-26 ENCOUNTER — Encounter (HOSPITAL_COMMUNITY): Payer: Medicare Other

## 2020-04-27 ENCOUNTER — Ambulatory Visit (INDEPENDENT_AMBULATORY_CARE_PROVIDER_SITE_OTHER): Payer: Medicare Other | Admitting: Physician Assistant

## 2020-04-27 ENCOUNTER — Ambulatory Visit (HOSPITAL_COMMUNITY)
Admission: RE | Admit: 2020-04-27 | Discharge: 2020-04-27 | Disposition: A | Discharge status: Home / Self Care | Payer: Medicare Other | Source: Ambulatory Visit | Attending: Vascular Surgery | Admitting: Vascular Surgery

## 2020-04-27 ENCOUNTER — Encounter: Payer: Self-pay | Admitting: Physician Assistant

## 2020-04-27 ENCOUNTER — Other Ambulatory Visit: Payer: Self-pay

## 2020-04-27 VITALS — BP 148/80 | HR 75 | Temp 97.6°F | Resp 20 | Ht 62.0 in | Wt 174.6 lb

## 2020-04-27 DIAGNOSIS — M79605 Pain in left leg: Secondary | ICD-10-CM | POA: Diagnosis not present

## 2020-04-27 DIAGNOSIS — R600 Localized edema: Secondary | ICD-10-CM | POA: Diagnosis not present

## 2020-04-27 DIAGNOSIS — M7989 Other specified soft tissue disorders: Secondary | ICD-10-CM

## 2020-04-27 DIAGNOSIS — I890 Lymphedema, not elsewhere classified: Secondary | ICD-10-CM | POA: Diagnosis not present

## 2020-04-27 NOTE — Progress Notes (Signed)
Office Note     CC:  follow up Requesting Provider:  Glendale Chard, MD  HPI: Sheila Reynolds is a 75 y.o. (11-02-1945) female who presents for evaluation of bilateral leg swelling.  She was last seen by Dr. Donnetta Hutching in June 2021 and evaluated for lymphedema.  Patient states she has been managing her swelling for over 5 years now.  She is elevating her legs periodically during the day and also wearing compression regularly.  She uses her lymphedema pumps every other day.  She has no history of DVT, venous ulcerations, trauma, or prior vascular intervention.  She is wondering if anything else can be offered to treat her lymphedema.   Past Medical History:  Diagnosis Date  . Anemia    3 months ago anemic  . Anxiety    on meds  . Arthritis    "all over" (01/15/2017)  . Asthma   . Bronchitis with emphysema   . Chest pain   . Chronic bronchitis ([place])   . Coronary artery disease    a. 01/2017 she underwent orbital atherectomy/DES to the proxmal LAD and PTCA to ostial D2. 2D Echo 01/15/17 showed mild LVH, EF 60-65%, grade 1 DD.  . Family history of anesthesia complication    daughter N/V  . Fibromyalgia   . GERD (gastroesophageal reflux disease)    on meds  . Glaucoma   . Heart murmur   . History of hiatal hernia   . Hx of echocardiogram    Echo (03/2013):  Tech limited; Mild focal basal septal hypertrophy, EF 60-65%, normal RVF  . Hyperlipidemia   . Hypertension   . Nonsustained ventricular tachycardia ([place])   . OSA on CPAP   . Premature atrial contractions   . PVC (premature ventricular contraction)    a. Holter 12/16: NSR, occ PAC,PVCs  . Sarcoidosis   . Type II diabetes mellitus ([place])     Past Surgical History:  Procedure Laterality Date  . BREAST SURGERY    . CARDIAC CATHETERIZATION  05/04/2007   reveals overall normal left ventricular systolic function. Ejection fraction 65-70%  . CATARACT EXTRACTION W/PHACO Right 07/20/2013   Procedure: CATARACT EXTRACTION PHACO AND  INTRAOCULAR LENS PLACEMENT (IOC);  Surgeon: Marylynn Pearson, MD;  Location: [facility];  Service: Ophthalmology;  Laterality: Right;  . COLONOSCOPY W/ BIOPSIES AND POLYPECTOMY    . COLONOSCOPY WITH PROPOFOL N/A 09/07/2014   Procedure: COLONOSCOPY WITH PROPOFOL;  Surgeon: Juanita Craver, MD;  [facility];  Service: Endoscopy;  Laterality: N/A;  . COLONOSCOPY WITH PROPOFOL N/A 01/10/2020   Procedure: COLONOSCOPY WITH PROPOFOL;  Surgeon: Juanita Craver, MD;  [facility];  Service: Endoscopy;  Laterality: N/A;  . CORONARY ANGIOPLASTY WITH STENT PLACEMENT  01/15/2017  . CORONARY ATHERECTOMY N/A 01/15/2017   Procedure: CORONARY ATHERECTOMY;  Surgeon: Nelva Bush, MD;  Location: [facility];  Service: Cardiovascular;  Laterality: N/A;  . CORONARY BALLOON ANGIOPLASTY N/A 01/15/2017   Procedure: CORONARY BALLOON ANGIOPLASTY;  Surgeon: Nelva Bush, MD;  Location: [facility];  Service: Cardiovascular;  Laterality: N/A;  . CORONARY STENT INTERVENTION N/A 01/15/2017   Procedure: CORONARY STENT INTERVENTION;  Surgeon: Nelva Bush, MD;  Location: [facility];  Service: Cardiovascular;  Laterality: N/A;  . ESOPHAGOGASTRODUODENOSCOPY (EGD) WITH PROPOFOL N/A 09/07/2014   Procedure: ESOPHAGOGASTRODUODENOSCOPY (EGD) WITH PROPOFOL;  Surgeon: Juanita Craver, MD;  [facility];  Service: Endoscopy;  Laterality: N/A;  . EXTERNAL EAR SURGERY Bilateral 1970s   tumors removed  . EYE SURGERY  Left 2019   cataract extraction   . EYE SURGERY Right 02/09/2018   eyelid surgery   . INTRAVASCULAR PRESSURE WIRE/FFR STUDY N/A 12/12/2016   Procedure: INTRAVASCULAR PRESSURE WIRE/FFR STUDY;  Surgeon: Nelva Bush, MD;  Location: [facility];  Service: Cardiovascular;  Laterality: N/A;  . MINI SHUNT INSERTION Right 07/20/2013   Procedure: INSERTION OF GLAUCOMA FILTRATION DEVICE RIGHT EYE;  Surgeon: Marylynn Pearson, MD;  Location: [facility];  Service: Ophthalmology;  Laterality:  Right;  . MITOMYCIN C APPLICATION Right 5/36/4680   Procedure: MITOMYCIN C APPLICATION;  Surgeon: Marylynn Pearson, MD;  Location: [facility];  Service: Ophthalmology;  Laterality: Right;  . MITOMYCIN C APPLICATION Right 05/01/2246   Procedure: MITOMYCIN C APPLICATION RIGHT EYE;  Surgeon: Marylynn Pearson, MD;  Location: [facility];  Service: Ophthalmology;  Laterality: Right;  . PLACEMENT OF BREAST IMPLANTS Bilateral 1992   "took all my breast tissue out; put implants in;fibrocystic breast disease "  . POLYPECTOMY  01/10/2020   Procedure: POLYPECTOMY;  Surgeon: Juanita Craver, MD;  [facility];  Service: Endoscopy;;  . RIGHT/LEFT HEART CATH AND CORONARY ANGIOGRAPHY N/A 12/12/2016   Procedure: RIGHT/LEFT HEART CATH AND CORONARY ANGIOGRAPHY;  Surgeon: Nelva Bush, MD;  Location: [facility];  Service: Cardiovascular;  Laterality: N/A;  . TONSILLECTOMY    . TOTAL ABDOMINAL HYSTERECTOMY  1982  . TRABECULECTOMY Right 02/21/2015   Procedure: TRABECULECTOMY WITH [facility] ON THE RIGHT EYE;  Surgeon: Marylynn Pearson, MD;  Location: [facility];  Service: Ophthalmology;  Laterality: Right;    Social History   Socioeconomic History  . Marital status: Married    Spouse name: Not on file  . Number of children: 2  . Years of education: Not on file  . Highest education level: Not on file  Occupational History  . Occupation: unemployed    Fish farm manager: [employer]  Tobacco Use  . Smoking status: Former Smoker    Packs/day: 1.00    Years: 4.00    Pack years: 4.00    Types: Cigarettes    Quit date: 02/11/1980    Years since quitting: 40.2  . Smokeless tobacco: Never Used  Vaping Use  . Vaping Use: Never used  Substance and Sexual Activity  . Alcohol use: No  . Drug use: No  . Sexual activity: Yes  Other Topics Concern  . Not on file  Social History Narrative   Lives with husband   Right hand   Drinks 2-3 cups caffeine daily   Social Determinants of Health   Financial Resource Strain:  Medium Risk  . Difficulty of Paying Living Expenses: Somewhat hard  Food Insecurity: No Food Insecurity  . Worried About Charity fundraiser in the Last Year: Never true  . Ran Out of Food in the Last Year: Never true  Transportation Needs: No Transportation Needs  . Lack of Transportation (Medical): No  . Lack of Transportation (Non-Medical): No  Physical Activity: Inactive  . Days of Exercise per Week: 0 days  . Minutes of Exercise per Session: 0 min  Stress: Stress Concern Present  . Feeling of Stress : To some extent  Social Connections: Not on file  Intimate Partner Violence: Not on file    Family History  Problem Relation Age of Onset  . Heart disease Father   . Diabetes Father   . Glaucoma Father   . Cancer Mother        unknown type, ?lung  . Heart disease Paternal Grandmother   .  Cancer Paternal Grandmother        unknown type  . Diabetes Brother   . Glaucoma Brother     Current Outpatient Medications  Medication Sig Dispense Refill  . acetaminophen (TYLENOL) 500 MG tablet Take 1,000 mg by mouth 2 (two) times daily as needed for moderate pain or headache.    . albuterol (PROVENTIL) (2.5 MG/3ML) 0.083% nebulizer solution Take 3 mLs (2.5 mg total) by nebulization daily as needed for wheezing or shortness of breath. 75 mL 12  . albuterol (VENTOLIN HFA) 108 (90 Base) MCG/ACT inhaler Inhale 2 puffs into the lungs every 6 (six) hours as needed for wheezing or shortness of breath. 18 g 12  . ALPRAZolam (XANAX) 0.5 MG tablet For sedation before MRI scan; take 1 tab 1 hour before scan; may repeat 1 tab 15 min before scan 2 tablet 0  . aspirin EC 81 MG tablet Take 81 mg by mouth daily. Swallow whole.    [name] atorvastatin (LIPITOR) 80 MG tablet TAKE 1 TABLET(80 MG) BY MOUTH DAILY 90 tablet 2  . Blood Glucose Monitoring Suppl (ACCU-CHEK AVIVA PLUS) w/Device KIT Use to check blood sugars 3 times a day. Dx code e11.65 1 kit 3  . brimonidine (ALPHAGAN P) 0.1 % SOLN Place 1 drop into  both eyes 2 (two) times daily.     . brinzolamide (AZOPT) 1 % ophthalmic suspension Place 1 drop into both eyes 3 (three) times daily.    . carvedilol (COREG) 12.5 MG tablet TAKE 1 TABLET(12.5 MG) BY MOUTH TWICE DAILY 180 tablet 3  . clopidogrel (PLAVIX) 75 MG tablet Take 1 tablet (75 mg total) by mouth daily. 90 tablet 3  . isosorbide mononitrate (IMDUR) 60 MG 24 hr tablet TAKE 1 TABLET(60 MG) BY MOUTH DAILY 90 tablet 3  . JANUMET 50-500 MG tablet TAKE 1 TABLET BY MOUTH TWICE DAILY WITH A MEAL 180 tablet 1  . Lancets (ONETOUCH DELICA PLUS [id number]) MISC CHECK BLOOD SUGAR BEFORE BREAKFAST AND DINNER 100 each 1  . metoprolol tartrate (LOPRESSOR) 25 MG tablet Take 0.5-1 tablets (12.5-25 mg total) by mouth as needed (PALPITATIONS). 90 tablet 2  . Misc Natural Products (FOCUSED MIND PO) Take 4 tablets by mouth daily. For memory    . montelukast (SINGULAIR) 10 MG tablet TAKE 1 TABLET(10 MG) BY MOUTH EVERY EVENING 90 tablet 1  . Nebulizers (COMPRESSOR/NEBULIZER) MISC Use as directed 1 each 0  . nitroGLYCERIN (NITROSTAT) 0.4 MG SL tablet Place 1 tablet (0.4 mg total) under the tongue every 5 (five) minutes as needed for chest pain. 25 tablet prn  . pantoprazole (PROTONIX) 40 MG tablet TAKE 1 TABLET(40 MG) BY MOUTH DAILY 30 tablet 11  . Polyvinyl Alcohol-Povidone PF (REFRESH) 1.4-0.6 % SOLN Place 1-2 drops into both eyes 3 (three) times daily as needed (dry/irritated eyes.).    [name] potassium chloride SA (KLOR-CON) 20 MEQ tablet Take 1 tablet (20 mEq total) by mouth every other day. 90 tablet 1  . prednisoLONE acetate (PRED FORTE) 1 % ophthalmic suspension Place 1 drop into both eyes 4 (four) times daily.     . pregabalin (LYRICA) 75 MG capsule TAKE 1 CAPSULE(75 MG) BY MOUTH TWICE DAILY 180 capsule 1  . Travoprost, BAK Free, (TRAVATAN) 0.004 % SOLN ophthalmic solution Place 1 drop into the right eye at bedtime.     . furosemide (LASIX) 40 MG tablet Take 1 tablet (40 mg total) by mouth every other day. 90  tablet 1   No current facility-administered medications for  this visit.    Allergies  Allergen Reactions  . Crestor [Rosuvastatin Calcium] Other (See Comments)    muscle aches  . Demerol  [Meperidine Hcl]     Other reaction(s): Hallucinations  . Shellfish Allergy Itching and Other (See Comments)    Crab, shrimp and lobster ---lips itch and tingle Was told not to eat again after having a allergy test. Lobster, crab and shrimp      REVIEW OF SYSTEMS:   '[X]'$  denotes positive finding, $RemoveBeforeDEI'[ ]'[id number]$  denotes negative finding Cardiac  Comments:  Chest pain or chest pressure:    Shortness of breath upon exertion:    Short of breath when lying flat:    Irregular heart rhythm:        Vascular    Pain in calf, thigh, or hip brought on by ambulation:    Pain in feet at night that wakes you up from your sleep:     Blood clot in your veins:    Leg swelling:         Pulmonary    Oxygen at home:    Productive cough:     Wheezing:         Neurologic    Sudden weakness in arms or legs:     Sudden numbness in arms or legs:     Sudden onset of difficulty speaking or slurred speech:    Temporary loss of vision in one eye:     Problems with dizziness:         Gastrointestinal    Blood in stool:     Vomited blood:         Genitourinary    Burning when urinating:     Blood in urine:        Psychiatric    Major depression:         Hematologic    Bleeding problems:    Problems with blood clotting too easily:        Skin    Rashes or ulcers:        Constitutional    Fever or chills:      PHYSICAL EXAMINATION:  Vitals:   04/27/20 1511  BP: (!) 148/80  Pulse: 75  Resp: 20  Temp: 97.6 F (36.4 C)  TempSrc: Temporal  SpO2: 97%  Weight: 174 lb 9.6 oz (79.2 kg)  Height: $Remove'5\' 2"'IkhNknS$  (1.575 m)    General:  WDWN in NAD; vital signs documented above Gait: Not observed HENT: WNL, normocephalic Pulmonary: normal non-labored breathing Cardiac: regular HR Abdomen: soft, NT, no  masses Skin: without rashes Vascular Exam/Pulses:  Right Left  Radial 2+ (normal) 2+ (normal)  DP 2+ (normal) 1+ (weak)   Extremities: without ischemic changes, without Gangrene , without cellulitis; without open wounds; pitting edema to the level of the knee; involves dorsal foot and toes; early stasis pigmentation changes medial lower legs L > R Musculoskeletal: no muscle wasting or atrophy  Neurologic: A&O X 3;  No focal weakness or paresthesias are detected Psychiatric:  The pt has Normal affect.   Non-Invasive Vascular Imaging:   Reflux study negative for DVT bilaterally Right common femoral vein incompetent Right saphenofemoral junction incompetent Right small saphenous vein at the popliteal fossa incompetent  Left common femoral vein incompetent Left saphenofemoral junction incompetent Left GSV in the mid thigh incompetent    ASSESSMENT/PLAN:: 75 y.o. female here for follow up evaluation of lymphedema affecting BLE  Patient returns to the office for reevaluation of lymphedema.  She was  last seen by Dr. Donnetta Hutching last June and is wondering if there is anything more she can do to help manage her symptoms.  She is already wearing good fitting compression stockings and elevating her legs periodically during the day.  She is using her lymphedema pumps every other day.  Her skin of bilateral lower extremities appears healthy and has not cracking or have any early signs of ulcerations.  Overall I think she is managing her symptoms very well.  She is aware the goal with lymphedema is to slow the progression of symptoms and I think she is doing a great job.  I encouraged her to continue her use of compression and elevation during the day.  She will follow-up if she were to develop wounds of bilateral lower extremities.  Otherwise nothing further to offer from a vascular surgery standpoint.  She will follow up on an as-needed basis.   Dagoberto Ligas, PA-C [facility] [phone]  Clinic MD:   Donzetta Matters

## 2020-04-30 ENCOUNTER — Encounter: Payer: Self-pay | Admitting: Physician Assistant

## 2020-04-30 ENCOUNTER — Encounter: Payer: Self-pay | Admitting: Internal Medicine

## 2020-04-30 ENCOUNTER — Ambulatory Visit (INDEPENDENT_AMBULATORY_CARE_PROVIDER_SITE_OTHER): Payer: Medicare Other | Admitting: Internal Medicine

## 2020-04-30 ENCOUNTER — Ambulatory Visit
Admission: RE | Admit: 2020-04-30 | Discharge: 2020-04-30 | Disposition: A | Discharge status: Home / Self Care | Payer: Medicare Other | Source: Ambulatory Visit | Attending: Adult Health | Admitting: Adult Health

## 2020-04-30 ENCOUNTER — Other Ambulatory Visit: Payer: Self-pay

## 2020-04-30 VITALS — BP 126/80 | HR 89 | Temp 98.6°F | Ht 62.0 in | Wt 175.0 lb

## 2020-04-30 DIAGNOSIS — D869 Sarcoidosis, unspecified: Secondary | ICD-10-CM

## 2020-04-30 DIAGNOSIS — I25119 Atherosclerotic heart disease of native coronary artery with unspecified angina pectoris: Secondary | ICD-10-CM

## 2020-04-30 DIAGNOSIS — R918 Other nonspecific abnormal finding of lung field: Secondary | ICD-10-CM | POA: Diagnosis not present

## 2020-04-30 DIAGNOSIS — R269 Unspecified abnormalities of gait and mobility: Secondary | ICD-10-CM

## 2020-04-30 DIAGNOSIS — I890 Lymphedema, not elsewhere classified: Secondary | ICD-10-CM | POA: Diagnosis not present

## 2020-04-30 DIAGNOSIS — R2689 Other abnormalities of gait and mobility: Secondary | ICD-10-CM

## 2020-04-30 DIAGNOSIS — E538 Deficiency of other specified B group vitamins: Secondary | ICD-10-CM | POA: Diagnosis not present

## 2020-04-30 MED ORDER — CYANOCOBALAMIN 1000 MCG/ML IJ SOLN
1000.0000 ug | Freq: Once | INTRAMUSCULAR | Status: AC
  Administered 2020-04-30: 1000 ug via INTRAMUSCULAR

## 2020-04-30 NOTE — Patient Instructions (Signed)

## 2020-04-30 NOTE — Progress Notes (Signed)
I,Katawbba Wiggins,acting as a Education administrator for Maximino Greenland, MD.,have documented all relevant documentation on the behalf of Maximino Greenland, MD,as directed by  Maximino Greenland, MD while in the presence of Maximino Greenland, MD.  This visit occurred during the SARS-CoV-2 public health emergency.  Safety protocols were in place, including screening questions prior to the visit, additional usage of staff PPE, and extensive cleaning of exam room while observing appropriate contact time as indicated for disinfecting solutions.  Subjective:     Patient ID: Sheila Reynolds , female    DOB: May 08, 1945 , 75 y.o.   MRN: [id number]   Chief Complaint  Patient presents with  . B12 Injection    HPI  The patient is here today for B12 deficiency f/u. She has started weekly Vit B12 injections, she still feels fatigued. Denies change in sleep/eating habits.     Past Medical History:  Diagnosis Date  . Anemia    3 months ago anemic  . Anxiety    on meds  . Arthritis    "all over" (01/15/2017)  . Asthma   . Bronchitis with emphysema   . Chest pain   . Chronic bronchitis ([place])   . Coronary artery disease    a. 01/2017 she underwent orbital atherectomy/DES to the proxmal LAD and PTCA to ostial D2. 2D Echo 01/15/17 showed mild LVH, EF 60-65%, grade 1 DD.  . Family history of anesthesia complication    daughter N/V  . Fibromyalgia   . GERD (gastroesophageal reflux disease)    on meds  . Glaucoma   . Heart murmur   . History of hiatal hernia   . Hx of echocardiogram    Echo (03/2013):  Tech limited; Mild focal basal septal hypertrophy, EF 60-65%, normal RVF  . Hyperlipidemia   . Hypertension   . Nonsustained ventricular tachycardia ([place])   . OSA on CPAP   . Premature atrial contractions   . PVC (premature ventricular contraction)    a. Holter 12/16: NSR, occ PAC,PVCs  . Sarcoidosis   . Type II diabetes mellitus ([facility])      Family History  Problem Relation Age of Onset  . Heart disease  Father   . Diabetes Father   . Glaucoma Father   . Cancer Mother        unknown type, ?lung  . Heart disease Paternal Grandmother   . Cancer Paternal Grandmother        unknown type  . Diabetes Brother   . Glaucoma Brother      Current Outpatient Medications:  .  acetaminophen (TYLENOL) 500 MG tablet, Take 1,000 mg by mouth 2 (two) times daily as needed for moderate pain or headache., Disp: , Rfl:  .  albuterol (PROVENTIL) (2.5 MG/3ML) 0.083% nebulizer solution, Take 3 mLs (2.5 mg total) by nebulization daily as needed for wheezing or shortness of breath., Disp: 75 mL, Rfl: 12 .  albuterol (VENTOLIN HFA) 108 (90 Base) MCG/ACT inhaler, Inhale 2 puffs into the lungs every 6 (six) hours as needed for wheezing or shortness of breath., Disp: 18 g, Rfl: 12 .  ALPRAZolam (XANAX) 0.5 MG tablet, For sedation before MRI scan; take 1 tab 1 hour before scan; may repeat 1 tab 15 min before scan, Disp: 2 tablet, Rfl: 0 .  aspirin EC 81 MG tablet, Take 81 mg by mouth daily. Swallow whole., Disp: , Rfl:  .  atorvastatin (LIPITOR) 80 MG tablet, TAKE 1 TABLET(80 MG) BY MOUTH DAILY, Disp: 90  tablet, Rfl: 2 .  Blood Glucose Monitoring Suppl (ACCU-CHEK AVIVA PLUS) w/Device KIT, Use to check blood sugars 3 times a day. Dx code e11.65, Disp: 1 kit, Rfl: 3 .  brimonidine (ALPHAGAN P) 0.1 % SOLN, Place 1 drop into both eyes 2 (two) times daily. , Disp: , Rfl:  .  brinzolamide (AZOPT) 1 % ophthalmic suspension, Place 1 drop into both eyes 3 (three) times daily., Disp: , Rfl:  .  carvedilol (COREG) 12.5 MG tablet, TAKE 1 TABLET(12.5 MG) BY MOUTH TWICE DAILY, Disp: 180 tablet, Rfl: 3 .  clopidogrel (PLAVIX) 75 MG tablet, Take 1 tablet (75 mg total) by mouth daily., Disp: 90 tablet, Rfl: 3 .  isosorbide mononitrate (IMDUR) 60 MG 24 hr tablet, TAKE 1 TABLET(60 MG) BY MOUTH DAILY, Disp: 90 tablet, Rfl: 3 .  JANUMET 50-500 MG tablet, TAKE 1 TABLET BY MOUTH TWICE DAILY WITH A MEAL, Disp: 180 tablet, Rfl: 1 .  Lancets  (ONETOUCH DELICA PLUS [id number]) MISC, CHECK BLOOD SUGAR BEFORE BREAKFAST AND DINNER, Disp: 100 each, Rfl: 1 .  Misc Natural Products (FOCUSED MIND PO), Take 4 tablets by mouth daily. For memory, Disp: , Rfl:  .  montelukast (SINGULAIR) 10 MG tablet, TAKE 1 TABLET(10 MG) BY MOUTH EVERY EVENING, Disp: 90 tablet, Rfl: 1 .  Nebulizers (COMPRESSOR/NEBULIZER) MISC, Use as directed, Disp: 1 each, Rfl: 0 .  nitroGLYCERIN (NITROSTAT) 0.4 MG SL tablet, Place 1 tablet (0.4 mg total) under the tongue every 5 (five) minutes as needed for chest pain., Disp: 25 tablet, Rfl: prn .  pantoprazole (PROTONIX) 40 MG tablet, TAKE 1 TABLET(40 MG) BY MOUTH DAILY, Disp: 30 tablet, Rfl: 11 .  Polyvinyl Alcohol-Povidone PF (REFRESH) 1.4-0.6 % SOLN, Place 1-2 drops into both eyes 3 (three) times daily as needed (dry/irritated eyes.)., Disp: , Rfl:  .  potassium chloride SA (KLOR-CON) 20 MEQ tablet, Take 1 tablet (20 mEq total) by mouth every other day., Disp: 90 tablet, Rfl: 1 .  prednisoLONE acetate (PRED FORTE) 1 % ophthalmic suspension, Place 1 drop into both eyes 4 (four) times daily. , Disp: , Rfl:  .  pregabalin (LYRICA) 75 MG capsule, TAKE 1 CAPSULE(75 MG) BY MOUTH TWICE DAILY, Disp: 180 capsule, Rfl: 1 .  Travoprost, BAK Free, (TRAVATAN) 0.004 % SOLN ophthalmic solution, Place 1 drop into the right eye at bedtime. , Disp: , Rfl:  .  furosemide (LASIX) 40 MG tablet, Take 1 tablet (40 mg total) by mouth every other day., Disp: 90 tablet, Rfl: 1 .  metoprolol tartrate (LOPRESSOR) 25 MG tablet, Take 0.5-1 tablets (12.5-25 mg total) by mouth as needed (PALPITATIONS). (Patient not taking: Reported on 04/30/2020), Disp: 90 tablet, Rfl: 2   Allergies  Allergen Reactions  . Crestor [Rosuvastatin Calcium] Other (See Comments)    muscle aches  . Demerol  [Meperidine Hcl]     Other reaction(s): Hallucinations  . Shellfish Allergy Itching and Other (See Comments)    Crab, shrimp and lobster ---lips itch and tingle Was told not  to eat again after having a allergy test. Lobster, crab and shrimp      Review of Systems  Constitutional: Negative.   Respiratory: Negative.   Cardiovascular: Negative.   Gastrointestinal: Negative.   Psychiatric/Behavioral: Negative.   All other systems reviewed and are negative.    Today's Vitals   04/30/20 1512  BP: 126/80  Pulse: 89  Temp: 98.6 F (37 C)  TempSrc: Oral  Weight: 175 lb (79.4 kg)  Height: 5' 2"  (1.575 m)  PainSc: 0-No pain   Body mass index is 32.01 kg/m.   Objective:  Physical Exam Vitals and nursing note reviewed.  Constitutional:      Appearance: Normal appearance.  HENT:     Head: Normocephalic and atraumatic.     Nose:     Comments: Masked     Mouth/Throat:     Comments: Masked  Cardiovascular:     Rate and Rhythm: Normal rate and regular rhythm.     Heart sounds: Normal heart sounds.  Pulmonary:     Breath sounds: Normal breath sounds.  Musculoskeletal:     Cervical back: Normal range of motion.     Right lower leg: Edema present.     Left lower leg: Edema present.  Skin:    General: Skin is warm.  Neurological:     General: No focal deficit present.     Mental Status: She is alert and oriented to person, place, and time.         Assessment And Plan:     1. B12 deficiency Comments: I will check vitamin B12 level. She was also givev vitamin B12 IM x 1.  - Vitamin B12 - CBC no Diff - cyanocobalamin ((VITAMIN B-12)) injection 1,000 mcg  2. Imbalance Comments: Pt advised that this should improve as her vitamin B12 level normalizes.  - Ambulatory referral to Physical Therapy  3. Gait abnormality Comments: She was not pleased with [facility]. Would like referral to [facility].  - Ambulatory referral to Physical Therapy  4. Lymphedema of both lower extremities Comments: I will refer her to [facility] for lymphatic massage. Encouraged to wear thigh high compression hose and elevate legs when seated.  - Ambulatory  referral to Physical Therapy     Patient was given opportunity to ask questions. Patient verbalized understanding of the plan and was able to repeat key elements of the plan. All questions were answered to their satisfaction.   I, Maximino Greenland, MD, have reviewed all documentation for this visit. The documentation on 04/30/20 for the exam, diagnosis, procedures, and orders are all accurate and complete.   IF YOU HAVE BEEN REFERRED TO A SPECIALIST, IT MAY TAKE 1-2 WEEKS TO SCHEDULE/PROCESS THE REFERRAL. IF YOU HAVE NOT HEARD FROM US/SPECIALIST IN TWO WEEKS, PLEASE GIVE [place] A CALL AT [phone].   THE PATIENT IS ENCOURAGED TO PRACTICE SOCIAL DISTANCING DUE TO THE COVID-19 PANDEMIC.

## 2020-05-01 ENCOUNTER — Telehealth: Payer: Self-pay

## 2020-05-01 ENCOUNTER — Telehealth: Payer: Medicare Other

## 2020-05-01 LAB — CBC
Hematocrit: 35.6 % (ref 34.0–46.6)
Hemoglobin: 11.2 g/dL (ref 11.1–15.9)
MCH: 27.1 pg (ref 26.6–33.0)
MCHC: 31.5 g/dL (ref 31.5–35.7)
MCV: 86 fL (ref 79–97)
Platelets: 211 10*3/uL (ref 150–450)
RBC: 4.14 x10E6/uL (ref 3.77–5.28)
RDW: 13.9 % (ref 11.7–15.4)
WBC: 7.8 10*3/uL (ref 3.4–10.8)

## 2020-05-01 LAB — VITAMIN B12: Vitamin B-12: 777 pg/mL (ref 232–1245)

## 2020-05-01 NOTE — Progress Notes (Signed)
Called and spoke with patient, advised of results/recommendations per Tammy Parrett NP.  She verbalized understanding.  Nothing further needed.

## 2020-05-01 NOTE — Telephone Encounter (Signed)
  Chronic Care Management   Outreach Note  05/01/2020 Name: Sheila Reynolds MRN: [id number] DOB: 1945-09-22  Referred by: [place] Chard, MD Reason for referral : Chronic Care Management (RN CM FU Call )   An unsuccessful telephone outreach was attempted today. The patient was referred to the case management team for assistance with care management and care coordination.   Follow Up Plan: A HIPAA compliant phone message was left for the patient providing contact information and requesting a return call. Telephone follow up appointment with care management team member scheduled for: 06/05/20  Barb Merino, RN, BSN, CCM Care Management Coordinator [facility]  Direct Phone: [phone]

## 2020-05-04 NOTE — Progress Notes (Signed)
Kindly inform the patient that EEG study was normal

## 2020-05-08 ENCOUNTER — Encounter: Payer: Self-pay | Admitting: Neurology

## 2020-05-08 ENCOUNTER — Telehealth: Payer: Self-pay | Admitting: Neurology

## 2020-05-08 NOTE — Telephone Encounter (Signed)
-----   Message from Darleen Crocker, RN sent at 05/07/2020  5:09 PM EDT -----  ----- Message ----- From: Garvin Fila, MD Sent: 05/04/2020   3:14 PM EDT To: Baldomero Lamy, RN  Kindly inform the patient that EEG study was normal

## 2020-05-08 NOTE — Telephone Encounter (Signed)
Called the pt and there was no answer. LVM advising the patient of the normal EEG results. Instructed the pt to call back with any questions.

## 2020-05-10 ENCOUNTER — Telehealth (HOSPITAL_COMMUNITY): Payer: Self-pay

## 2020-05-10 NOTE — Telephone Encounter (Signed)
Pt insurance is active and benefits verified through Medicare A/B. Co-pay $0.00, DED $233.00/$233.00 met, out of pocket $0.00/$0.00 met, co-insurance 20%. No pre-authorization required.   2ndary insurance is active and benefits verified through [employer]. Co-pay $0.00, DED $0.00/$0.00 met, out of pocket $0.00/$0.00 met, co-insurance 0%. No pre-authorization required. 

## 2020-05-10 NOTE — Telephone Encounter (Signed)
Called and spoke to pt in regards to PR, pt stated she is interested. Went over insurance, patient verbalized understanding. Will contact patient for scheduling at a later date.

## 2020-05-11 NOTE — Progress Notes (Signed)
HPI  F former smoker followed for bronchitis, hx  Occular sarcoid, bronchitis/ nodules, OSA complicated by GERD, glaucoma, DM2 NPSG 12/25/03- AHI  2.9/ hr, desaturation to 91%, body weight 164 lbs NPSG-10/18/13- Mild OSA, AHI 9.3/ hr, weight 164 lbs  ACE 09/17/15-47-WNL ACE level 08/18/16-30 PFT: 01/07/2011-FEV1 1.59/79%, FEV1/FVC 0.73, FEF 25-75% improved to 38% with bronchodilator. TLC 90% DLCO 55% Office Spirometry 06/09/16- moderate restriction of exhaled volume. FVC 1.51/68%, FEV1 1.20/70%, ratio 0.79, FEF 25-75% 1.19/73% Nuclear Stress Test 09/04/16- EF 65%, low risk. ECHO 07/31/16- Gr 1 DD Walk test on room air 12/21/2017-minimal saturation 94%, maximum heart rate 118/minute with no stops walking 3 laps x180 m. Office Spirometry 12/21/17 PFT 04/13/20- Minimal Obstruction and Minimal Restriction, no resp to BD, Moderately severe DLCO defect WALK TEST 4/422- 2 laps, min O2 sat 94%, max HR 127.  ---------------------------------------------------------------------------------------------------------------   12/30/19- 75 yo female former smoker followed for COPD/bronchiolitis/lung nodules, history of ocular sarcoid/ MTX, OSA, complicated by GERD, Glaucoma, DM 2, CAD/ stent, DDD lumbar spine Singulair, Neb albuterol, Ventolin hfa,  CPAP 9/[facility]    Today>> 7 to reduce leak Download-compliance 100%, AHI 0.4/ hr Body weight today-178 lbs Covid vax-3 Phizer  Flu vax- today senior ------Shortness of breath all the time but is worse with exertion, still having pain in back/chest. Cough that mostly dry but has been productive lately with beige color sputum.  Beige sputum and increased SOB but no acute event or fever.  R ear aches and itches. Sneezing and watery rhinorrhea.  Sees cardiology for chest pain and back pain- wearing monitor. CXR 01/23/2019- IMPRESSION: No active cardiopulmonary disease.  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 Supplies were ordered from [employer], but she is awaiting small mask. Doing well with CPAP. PFT reviewed. Using rescue inhaler TID. Cough productive cream. Chronic back pain across mid thoracic level. Describes gradually worsening band-like pain around upper chest.  PFT 04/13/20- Minimal Obstruction and Minimal Restriction, no resp to BD, Moderately severe DLCO defect WALK TEST 4/422- 2 laps, min O2 sat 94%, max HR 127.  CT chest- 04/30/20- IMPRESSION: 1. 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 Repeat high-resolution chest CT is recommended in 12 months to assess for temporal changes in the appearance of the lung parenchyma. 2. In addition, there is what appears to be chronic and worsening mucoid impaction in the right upper lobe, potentially related to a chronic indolent atypical infectious process such as mycobacterial infection. Attention at time of repeat high-resolution chest CT is recommended. 3. There is also mild centrilobular and paraseptal emphysema. 4. Aortic atherosclerosis, in addition to left main and 3 vessel coronary artery disease. Assessment for potential risk factor modification, dietary therapy or pharmacologic therapy may be warranted, if clinically indicated. Aortic Atherosclerosis (ICD10-I70.0) and Emphysema (ICD10-J43.9  ROS-see HPI     + = positive Constitutional:   No-   weight loss, night sweats, fevers, chills, fatigue, lassitude. HEENT:   +   headaches, difficulty swallowing, tooth/dental problems, sore throat,  Some  sneezing, itching, ear ache, +nasal congestion, post nasal drip,  CV: + chest pain, no-orthopnea, PND, swelling in lower extremities, anasarca, dizziness, palpitations Resp: +  shortness of breath with exertion not at rest.            +productive cough,  + non-productive cough,  No- coughing up of blood.               in color of mucus.   wheezing.   Skin: No-   rash or lesions. GI:  +  heartburn, indigestion, abdominal pain, nausea, vomiting,  GU:  MS:  +  joint pain or swelling, + back pain Neuro-     nothing unusual Psych:  No- change in mood or affect. No depression or anxiety.  No memory loss.  OBJ  Afebrile General- Alert, Oriented, Affect tearful, Distress- none acute, + overweight Skin- rash-none, lesions- none, excoriation- none Lymphadenopathy- none Head- atraumatic            Eyes- Gross vision intact, PERRLA, conjunctivae clear secretions- not injected            Ears- Hearing aid             Nose- + turbinate edema, no-Septal dev, mucus, polyps, erosion, perforation             Throat- Mallampati III , mucosa  , drainage- none, tonsils- atrophic Neck- flexible , trachea midline, no stridor , thyroid nl, carotid no bruit Chest - symmetrical excursion , unlabored           Heart/CV- RRR , no murmur , no gallop  , no rub, nl s1 s2                           - JVD- none , edema- none, stasis changes- none, varices- none           Lung-  Clear, Cough- none , wheeze-none, rhonchi-none , dullness-none, rub- none           Chest wall-  Abd- No HSM Br/ Gen/ Rectal- Not done, not indicated Extrem- cyanosis- none, clubbing, none, atrophy- none, strength- nl.  Neuro- grossly intact to observation

## 2020-05-14 ENCOUNTER — Ambulatory Visit (INDEPENDENT_AMBULATORY_CARE_PROVIDER_SITE_OTHER): Payer: Medicare Other | Admitting: Internal Medicine

## 2020-05-14 ENCOUNTER — Encounter: Payer: Self-pay | Admitting: Internal Medicine

## 2020-05-14 ENCOUNTER — Other Ambulatory Visit: Payer: Self-pay

## 2020-05-14 VITALS — BP 150/80 | HR 72 | Temp 96.0°F | Ht 62.0 in | Wt 172.4 lb

## 2020-05-14 DIAGNOSIS — M549 Dorsalgia, unspecified: Secondary | ICD-10-CM | POA: Diagnosis not present

## 2020-05-14 DIAGNOSIS — J42 Unspecified chronic bronchitis: Secondary | ICD-10-CM | POA: Diagnosis not present

## 2020-05-14 DIAGNOSIS — J441 Chronic obstructive pulmonary disease with (acute) exacerbation: Secondary | ICD-10-CM | POA: Diagnosis not present

## 2020-05-14 DIAGNOSIS — G8929 Other chronic pain: Secondary | ICD-10-CM

## 2020-05-14 DIAGNOSIS — M546 Pain in thoracic spine: Secondary | ICD-10-CM

## 2020-05-14 DIAGNOSIS — G4733 Obstructive sleep apnea (adult) (pediatric): Secondary | ICD-10-CM | POA: Diagnosis not present

## 2020-05-14 DIAGNOSIS — I25119 Atherosclerotic heart disease of native coronary artery with unspecified angina pectoris: Secondary | ICD-10-CM | POA: Diagnosis not present

## 2020-05-14 DIAGNOSIS — D869 Sarcoidosis, unspecified: Secondary | ICD-10-CM

## 2020-05-14 DIAGNOSIS — R0902 Hypoxemia: Secondary | ICD-10-CM | POA: Diagnosis not present

## 2020-05-14 MED ORDER — ANORO ELLIPTA 62.5-25 MCG/INH IN AEPB: 1.0000 | INHALATION_SPRAY | Freq: Every day | RESPIRATORY_TRACT | 0 refills | Status: DC

## 2020-05-14 NOTE — Patient Instructions (Addendum)
Order- lab- Sputum culture- AFB, Fungal and routine C&S with sensitivities   Dx Chronic bronchitis  Order- lab- Angiotensin Converting Enzyme level    Dx Sarcoid  Order- referral to Dr Greta Doom- Pain Management-  Chronic back pain  Order- sample x 2 Anoro inhale 1 puff, once daily   See if this helps your breathing  Order- schedule overnight oximetry  Dx Hypoxemia  Order- Walk test O2 qualifying    Dx Dyspnea on Exertion  Please call if we can help

## 2020-05-15 ENCOUNTER — Telehealth: Payer: Self-pay | Admitting: Internal Medicine

## 2020-05-15 DIAGNOSIS — R0902 Hypoxemia: Secondary | ICD-10-CM

## 2020-05-15 NOTE — Telephone Encounter (Signed)
The ONO order placed yesterday needs instructions on how the ONO should be administered.  (on room air, etc.)

## 2020-05-15 NOTE — Telephone Encounter (Signed)
New order placed for ONO on RA. Will forward to PCCs to inform.

## 2020-05-16 ENCOUNTER — Encounter: Payer: Self-pay | Admitting: *Deleted

## 2020-05-16 ENCOUNTER — Encounter: Payer: Self-pay | Admitting: Internal Medicine

## 2020-05-16 ENCOUNTER — Telehealth: Payer: Self-pay | Admitting: Cardiovascular Disease

## 2020-05-16 DIAGNOSIS — J441 Chronic obstructive pulmonary disease with (acute) exacerbation: Secondary | ICD-10-CM

## 2020-05-16 DIAGNOSIS — D869 Sarcoidosis, unspecified: Secondary | ICD-10-CM

## 2020-05-16 DIAGNOSIS — J42 Unspecified chronic bronchitis: Secondary | ICD-10-CM

## 2020-05-16 LAB — ANGIOTENSIN CONVERTING ENZYME: Angiotensin-Converting Enzyme: 22 U/L (ref 9–67)

## 2020-05-16 NOTE — Telephone Encounter (Signed)
I called patient to discuss.  Her main concern is that she was told about coronary calcifications on a pulmonary CT. I informed her that this is not news - we diagnosed her with CAD in 2018 and she has a stent. She was relieved to hear that this was not new  Her chest pain is more pins and needles sensation.  I've asked her to take a SL NTG if she has prolonged pain and to come to the ER if the prolonged pain / pressure / tightness does not resolve. She has an appt to follow up with [place] in May .     Mertie Moores, MD  05/16/2020 6:43 PM    [facility] [place],  [address], [place]  [address] Phone: [phone]; Fax: [phone]

## 2020-05-16 NOTE — Telephone Encounter (Signed)
Pt c/o of Chest Pain: STAT if CP now or developed within 24 hours  1. Are you having CP right now? No   2. Are you experiencing any other symptoms (ex. SOB, nausea, vomiting, sweating)? SOB  3. How long have you been experiencing CP? Couple of days   4. Is your CP continuous or coming and going? Coming and going   5. Have you taken Nitroglycerin? No     Brit would also like to discuss her CT results when calling back. Please advise.

## 2020-05-16 NOTE — Telephone Encounter (Signed)
I spoke with patient. She reports pain in her chest and back. Chest pain started last week. It is under her left breast and feels like pins sticking her. She will then get a chill.  Lasts about a minute and goes away on it's own. Occurs at least 2-3 times per day. Once occurred while cleaning the kitchen.  All other times have been at rest like when she is sitting doing paperwork.  She has not tried NTG and states last time she went to pick it up at the pharmacy cost was $100.  I asked her to check again with pharmacy regarding cost of this medication. Patient also reports back pain that goes around to under her breast.  This is constant and has been going on "forever.". She has shortness of breath at times. Some days are worse but today it is not bad. Had CT scan done recently and is concerned about cardiac findings.  I explained to patient that she has known CAD.  Patient states she saw Dr Annamaria Boots recently and was advised to contact Dr Acie Fredrickson regarding chest pain. Will forward to Dr Acie Fredrickson for review/recommendations and review of CT.  ED precautions reviewed with patient.

## 2020-05-17 ENCOUNTER — Other Ambulatory Visit: Payer: Medicare Other

## 2020-05-17 ENCOUNTER — Telehealth: Payer: Self-pay | Admitting: Internal Medicine

## 2020-05-17 DIAGNOSIS — J42 Unspecified chronic bronchitis: Secondary | ICD-10-CM | POA: Diagnosis not present

## 2020-05-17 NOTE — Telephone Encounter (Signed)
Called and spoke with Sheila Reynolds. The doctor on the referral is not at her facility. Referral needs to be resent in.   Dr. Greta Doom is at [facility].

## 2020-05-18 NOTE — Telephone Encounter (Signed)
I have faxed the order and records to this doc they should call the patient

## 2020-05-21 DIAGNOSIS — N644 Mastodynia: Secondary | ICD-10-CM | POA: Diagnosis not present

## 2020-05-21 LAB — HM MAMMOGRAPHY: HM Mammogram: NORMAL (ref 0–4)

## 2020-05-23 ENCOUNTER — Ambulatory Visit: Payer: Medicare Other | Admitting: Physical Therapy

## 2020-05-23 ENCOUNTER — Encounter: Payer: Self-pay | Admitting: Internal Medicine

## 2020-05-23 DIAGNOSIS — H401122 Primary open-angle glaucoma, left eye, moderate stage: Secondary | ICD-10-CM | POA: Diagnosis not present

## 2020-05-23 DIAGNOSIS — H401113 Primary open-angle glaucoma, right eye, severe stage: Secondary | ICD-10-CM | POA: Diagnosis not present

## 2020-05-23 DIAGNOSIS — H2013 Chronic iridocyclitis, bilateral: Secondary | ICD-10-CM | POA: Diagnosis not present

## 2020-05-24 ENCOUNTER — Encounter: Payer: Self-pay | Admitting: Internal Medicine

## 2020-05-24 DIAGNOSIS — G4730 Sleep apnea, unspecified: Secondary | ICD-10-CM | POA: Diagnosis not present

## 2020-05-24 DIAGNOSIS — R0683 Snoring: Secondary | ICD-10-CM | POA: Diagnosis not present

## 2020-05-24 DIAGNOSIS — M549 Dorsalgia, unspecified: Secondary | ICD-10-CM | POA: Insufficient documentation

## 2020-05-24 NOTE — Assessment & Plan Note (Signed)
Benefits from CPAP Plan- continue CPAP 7

## 2020-05-24 NOTE — Assessment & Plan Note (Signed)
She has been complaining of this and now describes worsening.  Seems consistent with DGD thoracic spine.  Plan- referral to pain clinic

## 2020-05-24 NOTE — Assessment & Plan Note (Signed)
Worsening bronchitis Plan- overnight oximetry, samples Anoro inhaler, ACE level, sputum culture

## 2020-05-25 ENCOUNTER — Other Ambulatory Visit (HOSPITAL_COMMUNITY): Payer: Medicare Other

## 2020-05-29 ENCOUNTER — Other Ambulatory Visit: Payer: Self-pay

## 2020-05-29 ENCOUNTER — Ambulatory Visit (INDEPENDENT_AMBULATORY_CARE_PROVIDER_SITE_OTHER): Payer: Medicare Other

## 2020-05-29 VITALS — BP 130/80 | HR 89 | Temp 98.1°F | Ht 62.0 in | Wt 170.0 lb

## 2020-05-29 DIAGNOSIS — E538 Deficiency of other specified B group vitamins: Secondary | ICD-10-CM

## 2020-05-29 MED ORDER — CYANOCOBALAMIN 1000 MCG/ML IJ SOLN
1000.0000 ug | Freq: Once | INTRAMUSCULAR | Status: AC
  Administered 2020-05-29: 1000 ug via INTRAMUSCULAR

## 2020-05-29 NOTE — Progress Notes (Signed)
Pt is here for b12 injection. 

## 2020-06-02 DIAGNOSIS — Z03818 Encounter for observation for suspected exposure to other biological agents ruled out: Secondary | ICD-10-CM | POA: Diagnosis not present

## 2020-06-02 DIAGNOSIS — U071 COVID-19: Secondary | ICD-10-CM | POA: Diagnosis not present

## 2020-06-04 NOTE — Telephone Encounter (Signed)
CY please advise.  Ok to send back to triage   I have two positive Covid results. Congestion, fever and chills,, sore throat headache terrible body aches cough  help!

## 2020-06-05 ENCOUNTER — Telehealth: Payer: Medicare Other

## 2020-06-05 ENCOUNTER — Ambulatory Visit (INDEPENDENT_AMBULATORY_CARE_PROVIDER_SITE_OTHER): Payer: Medicare Other

## 2020-06-05 DIAGNOSIS — I110 Hypertensive heart disease with heart failure: Secondary | ICD-10-CM

## 2020-06-05 DIAGNOSIS — E1165 Type 2 diabetes mellitus with hyperglycemia: Secondary | ICD-10-CM

## 2020-06-05 DIAGNOSIS — M797 Fibromyalgia: Secondary | ICD-10-CM

## 2020-06-05 DIAGNOSIS — I739 Peripheral vascular disease, unspecified: Secondary | ICD-10-CM

## 2020-06-05 DIAGNOSIS — I890 Lymphedema, not elsewhere classified: Secondary | ICD-10-CM

## 2020-06-05 DIAGNOSIS — E559 Vitamin D deficiency, unspecified: Secondary | ICD-10-CM

## 2020-06-05 NOTE — Telephone Encounter (Signed)
I am sorry to hear of the positive Covid test. Please refer to the [facility] program at [facility]. They will contact her and determine treatment recomendations.

## 2020-06-06 ENCOUNTER — Telehealth (HOSPITAL_COMMUNITY): Payer: Self-pay

## 2020-06-06 ENCOUNTER — Telehealth: Payer: Self-pay | Admitting: Internal Medicine

## 2020-06-06 DIAGNOSIS — Z20822 Contact with and (suspected) exposure to covid-19: Secondary | ICD-10-CM | POA: Diagnosis not present

## 2020-06-06 NOTE — Chronic Care Management (AMB) (Signed)
Chronic Care Management   CCM RN Visit Note  06/05/2020 Name: Sheila Reynolds MRN: [id number] DOB: 12-Jan-1946  Subjective: Sheila Reynolds is a 75 y.o. year old female who is a primary care patient of Glendale Chard, MD. The care management team was consulted for assistance with disease management and care coordination needs.    Engaged with patient by telephone for follow up visit in response to provider referral for case management and/or care coordination services.   Consent to Services:  The patient was given information about Chronic Care Management services, agreed to services, and gave verbal consent prior to initiation of services.  Please see initial visit note for detailed documentation.   Patient agreed to services and verbal consent obtained.   Assessment: Review of patient past medical history, allergies, medications, health status, including review of consultants reports, laboratory and other test data, was performed as part of comprehensive evaluation and provision of chronic care management services.   SDOH (Social Determinants of Health) assessments and interventions performed:  No  CCM Care Plan  Allergies  Allergen Reactions  . Crestor [Rosuvastatin Calcium] Other (See Comments)    muscle aches  . Demerol  [Meperidine Hcl]     Other reaction(s): Hallucinations  . Shellfish Allergy Itching and Other (See Comments)    Crab, shrimp and lobster ---lips itch and tingle Was told not to eat again after having a allergy test. Lobster, crab and shrimp     Outpatient Encounter Medications as of 06/05/2020  Medication Sig  . acetaminophen (TYLENOL) 500 MG tablet Take 1,000 mg by mouth 2 (two) times daily as needed for moderate pain or headache.  . albuterol (PROVENTIL) (2.5 MG/3ML) 0.083% nebulizer solution Take 3 mLs (2.5 mg total) by nebulization daily as needed for wheezing or shortness of breath.  [name] albuterol (VENTOLIN HFA) 108 (90 Base) MCG/ACT inhaler Inhale 2  puffs into the lungs every 6 (six) hours as needed for wheezing or shortness of breath.  . ALPRAZolam (XANAX) 0.5 MG tablet For sedation before MRI scan; take 1 tab 1 hour before scan; may repeat 1 tab 15 min before scan  . aspirin EC 81 MG tablet Take 81 mg by mouth daily. Swallow whole.  [name] atorvastatin (LIPITOR) 80 MG tablet TAKE 1 TABLET(80 MG) BY MOUTH DAILY  . Blood Glucose Monitoring Suppl (ACCU-CHEK AVIVA PLUS) w/Device KIT Use to check blood sugars 3 times a day. Dx code e11.65  . brimonidine (ALPHAGAN P) 0.1 % SOLN Place 1 drop into both eyes 2 (two) times daily.   . brinzolamide (AZOPT) 1 % ophthalmic suspension Place 1 drop into both eyes 3 (three) times daily.  . carvedilol (COREG) 12.5 MG tablet TAKE 1 TABLET(12.5 MG) BY MOUTH TWICE DAILY  . clopidogrel (PLAVIX) 75 MG tablet Take 1 tablet (75 mg total) by mouth daily.  . isosorbide mononitrate (IMDUR) 60 MG 24 hr tablet TAKE 1 TABLET(60 MG) BY MOUTH DAILY  . JANUMET 50-500 MG tablet TAKE 1 TABLET BY MOUTH TWICE DAILY WITH A MEAL  . Lancets (ONETOUCH DELICA PLUS TLXBWI20B) MISC CHECK BLOOD SUGAR BEFORE BREAKFAST AND DINNER  . metoprolol tartrate (LOPRESSOR) 25 MG tablet Take 0.5-1 tablets (12.5-25 mg total) by mouth as needed (PALPITATIONS).  . Misc Natural Products (FOCUSED MIND PO) Take 4 tablets by mouth daily. For memory  . montelukast (SINGULAIR) 10 MG tablet TAKE 1 TABLET(10 MG) BY MOUTH EVERY EVENING  . Nebulizers (COMPRESSOR/NEBULIZER) MISC Use as directed  . nitroGLYCERIN (NITROSTAT) 0.4 MG SL tablet Place  1 tablet (0.4 mg total) under the tongue every 5 (five) minutes as needed for chest pain.  . pantoprazole (PROTONIX) 40 MG tablet TAKE 1 TABLET(40 MG) BY MOUTH DAILY  . Polyvinyl Alcohol-Povidone PF (REFRESH) 1.4-0.6 % SOLN Place 1-2 drops into both eyes 3 (three) times daily as needed (dry/irritated eyes.).  [name] potassium chloride SA (KLOR-CON) 20 MEQ tablet Take 1 tablet (20 mEq total) by mouth every other day.  .  prednisoLONE acetate (PRED FORTE) 1 % ophthalmic suspension Place 1 drop into both eyes 4 (four) times daily.   . pregabalin (LYRICA) 75 MG capsule TAKE 1 CAPSULE(75 MG) BY MOUTH TWICE DAILY  . Travoprost, BAK Free, (TRAVATAN) 0.004 % SOLN ophthalmic solution Place 1 drop into the right eye at bedtime.   [name] umeclidinium-vilanterol (ANORO ELLIPTA) 62.5-25 MCG/INH AEPB Inhale 1 puff into the lungs daily.   No facility-administered encounter medications on file as of 06/05/2020.    Patient Active Problem List   Diagnosis Date Noted  . Back pain 05/24/2020  . 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 heart failure ([place]) 04/16/2020  . 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) ([place]) 04/13/2020  . Chronic diastolic heart failure ([place]) 03/28/2020  . Uncontrolled type 2 diabetes mellitus with hyperglycemia ([place]) 07/25/2019  . Hiatal hernia 07/25/2019  . Spontaneous ecchymoses 07/25/2019  . Class 1 obesity due to excess calories with serious comorbidity and body mass index (BMI) of 32.0 to 32.9 in adult 07/25/2019  . Peripheral vascular disease, unspecified ([place]) 03/23/2019  . 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 Fibromyalgia 06/01/2017  . DDD (degenerative disc disease), lumbar 06/01/2017  . 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 History of hypercholesterolemia 06/01/2017  . History of anxiety 06/01/2017  . History of IBS 06/01/2017  . Premature atrial contractions 03/30/2017  . PVC's (premature ventricular contractions) 03/30/2017  . Diabetes mellitus without complication ([place]) 62/83/6629  . Angina pectoris ([place]) 01/15/2017  . CAD (coronary artery disease) 01/12/2017  . Shortness of breath 12/12/2016  . Acute maxillary sinusitis 05/03/2014  . Obstructive sleep apnea 11/04/2013  . Multiple lung nodules 07/18/2012  . Heart palpitations 03/11/2012  . HTN (hypertension) 03/11/2012  . Gastroesophageal reflux disease without esophagitis 07/13/2011   . Sarcoidosis 02/08/2011  . COPD with acute exacerbation ([place]) 02/05/2011  . Hyperlipemia 09/24/2010  . Atypical chest pain 09/24/2010  . NSVT (nonsustained ventricular tachycardia) ([place]) 09/24/2010  . Leg edema 09/24/2010    Conditions to be addressed/monitored:Uncontrolled type two DM with hyperglycemia, Vitamin D deficiency,  Fibromyalgia, Hypertension, Peripheral Vascular disease, Lymphedema of both lower extremities  Care Plan : COVID 19  Updates made by Lynne Logan, RN since 06/06/2020 12:00 AM    Problem: COVID 19 Infection   Priority: High    Goal: COVID 19 Infection -complications minimized or prevented   Start Date: 06/05/2020  Expected End Date: 07/05/2020  This Visit's Progress: On track  Priority: High  Note:   Current Barriers:   Ineffective Self Health Maintenance  Clinical Goal(s):  [name] Collaboration with Glendale Chard, MD regarding development and update of comprehensive plan of care as evidenced by provider attestation and co-signature . Inter-disciplinary care team collaboration (see longitudinal plan of care)  patient will work with care management team to address care coordination and chronic disease management needs related to Disease Management  Educational Needs  Care Coordination  Medication Management and Education  Psychosocial Support   Interventions:   Evaluation of current treatment plan related to  COVID 19 , self-management  and patient's adherence to plan as established by provider.  Collaboration with Glendale Chard, MD regarding development and update of comprehensive plan of care as evidenced by provider attestation       and co-signature  Inter-disciplinary care team collaboration (see longitudinal plan of care) . Determined patient tested positive for COVID 19 on 06/02/20 after becoming ill 2 days prior following her husband experiencing symptoms  . Discussed patient contacted her Pulmonologist, Dr. Annamaria Boots to make him aware but did  not receive a call back . Determined per message noted in patient's chart, Dr. Annamaria Boots has sent a referral to [facility] for treatment of COVID 19 via monoclonial infusion . Educated patient on what the monoclonial infusion is and how it is scheduled and administered, discussed patient should receive a call from the [facility] clinic  . Determined patient is experiencing nasal congestion, mild shortness of breath, fatigue, body aches and slight cough . Determined patient is taking zinc, tylenol, Vitamin C and Singulair in addition to her other medications, she believes she is staying well hydrated, reports she feels some better today but has been sleeping most days since contracting symptoms, patient's oxygen saturation is running 96-98% per home monitoring   . Sent message to PCP Dr. Glendale Chard with a status update concerning patient's COVID 19 infection and recommendations/referral per Dr. Annamaria Boots to receive the monoclonial antibody infusion   Discussed plans with patient for ongoing care management follow up and provided patient with direct contact information for care management team Self Care Activities:  . Contact your healthcare team if symptoms worsen . Continue to stay well hydrated . Expect a call from the [place] clinic concerning the monoclonial infusion  . Get plenty of rest and continue to follow CDC guidelines for quarrentine as discussed  . If you develop severe shortness of breath or if your oxygen level drops below 90% call 911 or go to the nearest ED Patient Goals: - to recover from [place] 19  Follow Up Plan: Telephone follow up appointment with care management team member scheduled for: 06/07/20    Plan:Telephone follow up appointment with care management team member scheduled for:  07/07/20  Barb Merino, RN, BSN, CCM Care Management Coordinator [facility]  Direct Phone: [phone]

## 2020-06-06 NOTE — Telephone Encounter (Signed)
Called to discuss with patient about COVID-19 symptoms and the use of one of the available treatments for those with mild to moderate Covid symptoms and at a high risk of hospitalization.  Pt appears to qualify for outpatient treatment due to co-morbid conditions and/or a member of an at-risk group in accordance with the FDA Emergency Use Authorization.    Symptom onset: unknown Vaccinated: Yes, per chart Booster: Yes, per chart Immunocompromised: No Qualifiers: CAD, HTN, age, CHF, COPD, DM, HLD, BMI>25, per chart NIH Criteria: Tier 2  Unable to reach pt - LVM 06/06/20 @ [place], RN

## 2020-06-06 NOTE — Telephone Encounter (Signed)
Called and spoke with patient to let her know that referral for covid treatment was placed yesterday and that they should contact her. Advised her that she has a 10 day window so she is not out of time yet. She expressed understanding. Nothing further needed at this time.

## 2020-06-06 NOTE — Patient Instructions (Signed)
Goals Addressed    . COVID 19 Infection - minimized or prevented   On track    Timeframe:  Short-Term Goal Priority:  High Start Date:  06/05/20                          Expected End Date:  07/05/20      Next Follow Up date: 06/07/20        Self Care Activities:  . Contact your healthcare team if symptoms worsen . Continue to stay well hydrated . Expect a call from the [facility] clinic concerning the monoclonial infusion  . Get plenty of rest and continue to follow CDC guidelines for quarrentine as discussed  . If you develop severe shortness of breath or if your oxygen level drops below 90% call 911 or go to the nearest ED Patient Goals: - to recover from COVID 19                

## 2020-06-07 ENCOUNTER — Ambulatory Visit (HOSPITAL_COMMUNITY): Payer: Medicare Other | Admitting: Physical Therapy

## 2020-06-07 ENCOUNTER — Other Ambulatory Visit: Payer: Self-pay | Admitting: Physician Assistant

## 2020-06-07 ENCOUNTER — Telehealth: Payer: Medicare Other

## 2020-06-07 ENCOUNTER — Ambulatory Visit: Payer: Self-pay

## 2020-06-07 DIAGNOSIS — E1165 Type 2 diabetes mellitus with hyperglycemia: Secondary | ICD-10-CM

## 2020-06-07 DIAGNOSIS — D869 Sarcoidosis, unspecified: Secondary | ICD-10-CM

## 2020-06-07 DIAGNOSIS — I110 Hypertensive heart disease with heart failure: Secondary | ICD-10-CM

## 2020-06-07 DIAGNOSIS — I25119 Atherosclerotic heart disease of native coronary artery with unspecified angina pectoris: Secondary | ICD-10-CM

## 2020-06-07 DIAGNOSIS — Z20822 Contact with and (suspected) exposure to covid-19: Secondary | ICD-10-CM

## 2020-06-07 DIAGNOSIS — I5032 Chronic diastolic (congestive) heart failure: Secondary | ICD-10-CM

## 2020-06-07 DIAGNOSIS — M797 Fibromyalgia: Secondary | ICD-10-CM

## 2020-06-07 DIAGNOSIS — U071 COVID-19: Secondary | ICD-10-CM

## 2020-06-07 DIAGNOSIS — I739 Peripheral vascular disease, unspecified: Secondary | ICD-10-CM

## 2020-06-07 DIAGNOSIS — I890 Lymphedema, not elsewhere classified: Secondary | ICD-10-CM

## 2020-06-07 DIAGNOSIS — E559 Vitamin D deficiency, unspecified: Secondary | ICD-10-CM

## 2020-06-07 DIAGNOSIS — J449 Chronic obstructive pulmonary disease, unspecified: Secondary | ICD-10-CM

## 2020-06-07 NOTE — Progress Notes (Signed)
I connected by phone with Sheila Reynolds on 06/07/2020 at 5:04 PM to discuss the potential use of a new treatment for mild to moderate COVID-19 viral infection in non-hospitalized patients.  This patient is a 75 y.o. female that meets the FDA criteria for Emergency Use Authorization of COVID monoclonal antibody bebtelovimab.  Has a (+) direct SARS-CoV-2 viral test result  Has mild or moderate COVID-19   Is NOT hospitalized due to COVID-19  Is within 10 days of symptom onset  Has at least one of the high risk factor(s) for progression to severe COVID-19 and/or hospitalization as defined in EUA.  Specific high risk criteria : Older age (>/= 73 yo), BMI > 25, Diabetes, Cardiovascular disease or hypertension and Chronic Lung Disease   I have spoken and communicated the following to the patient or parent/caregiver regarding COVID monoclonal antibody treatment:  1. FDA has authorized the emergency use for the treatment of mild to moderate COVID-19 in adults and pediatric patients with positive results of direct SARS-CoV-2 viral testing who are 30 years of age and older weighing at least 40 kg, and who are at high risk for progressing to severe COVID-19 and/or hospitalization.  2. The significant known and potential risks and benefits of COVID monoclonal antibody, and the extent to which such potential risks and benefits are unknown.  3. Information on available alternative treatments and the risks and benefits of those alternatives, including clinical trials.  4. Patients treated with COVID monoclonal antibody should continue to self-isolate and use infection control measures (e.g., wear mask, isolate, social distance, avoid sharing personal items, clean and disinfect "high touch" surfaces, and frequent handwashing) according to CDC guidelines.   5. The patient or parent/caregiver has the option to accept or refuse COVID monoclonal antibody treatment.  6. Discussion about the monoclonal  antibody infusion does not ensure treatment. The patient will be placed on a list and scheduled according to risk, symptom onset and availability. A scheduler will reach to the patient to let them know if we can accommodate their infusion or not.  After reviewing this information with the patient, the patient has agreed to receive one of the available covid 19 monoclonal antibodies and will be provided an appropriate fact sheet prior to infusion. Angelena Form, PA-C 06/07/2020 5:04 PM

## 2020-06-07 NOTE — Chronic Care Management (AMB) (Signed)
Chronic Care Management   CCM RN Visit Note  06/07/2020 Name: Sheila Reynolds MRN: [id number] DOB: 04-13-1945  Subjective: Sheila Reynolds is a 75 y.o. year old female who is a primary care patient of Glendale Chard, MD. The care management team was consulted for assistance with disease management and care coordination needs.    Engaged with patient by telephone for follow up visit in response to provider referral for case management and/or care coordination services.   Consent to Services:  The patient was given information about Chronic Care Management services, agreed to services, and gave verbal consent prior to initiation of services.  Please see initial visit note for detailed documentation.   Patient agreed to services and verbal consent obtained.   Assessment: Review of patient past medical history, allergies, medications, health status, including review of consultants reports, laboratory and other test data, was performed as part of comprehensive evaluation and provision of chronic care management services.   SDOH (Social Determinants of Health) assessments and interventions performed:  No  CCM Care Plan  Allergies  Allergen Reactions  . Crestor [Rosuvastatin Calcium] Other (See Comments)    muscle aches  . Demerol  [Meperidine Hcl]     Other reaction(s): Hallucinations  . Shellfish Allergy Itching and Other (See Comments)    Crab, shrimp and lobster ---lips itch and tingle Was told not to eat again after having a allergy test. Lobster, crab and shrimp     Outpatient Encounter Medications as of 06/07/2020  Medication Sig  . acetaminophen (TYLENOL) 500 MG tablet Take 1,000 mg by mouth 2 (two) times daily as needed for moderate pain or headache.  . albuterol (PROVENTIL) (2.5 MG/3ML) 0.083% nebulizer solution Take 3 mLs (2.5 mg total) by nebulization daily as needed for wheezing or shortness of breath.  [name] albuterol (VENTOLIN HFA) 108 (90 Base) MCG/ACT inhaler Inhale 2  puffs into the lungs every 6 (six) hours as needed for wheezing or shortness of breath.  . ALPRAZolam (XANAX) 0.5 MG tablet For sedation before MRI scan; take 1 tab 1 hour before scan; may repeat 1 tab 15 min before scan  . aspirin EC 81 MG tablet Take 81 mg by mouth daily. Swallow whole.  [name] atorvastatin (LIPITOR) 80 MG tablet TAKE 1 TABLET(80 MG) BY MOUTH DAILY  . Blood Glucose Monitoring Suppl (ACCU-CHEK AVIVA PLUS) w/Device KIT Use to check blood sugars 3 times a day. Dx code e11.65  . brimonidine (ALPHAGAN P) 0.1 % SOLN Place 1 drop into both eyes 2 (two) times daily.   . brinzolamide (AZOPT) 1 % ophthalmic suspension Place 1 drop into both eyes 3 (three) times daily.  . carvedilol (COREG) 12.5 MG tablet TAKE 1 TABLET(12.5 MG) BY MOUTH TWICE DAILY  . clopidogrel (PLAVIX) 75 MG tablet Take 1 tablet (75 mg total) by mouth daily.  . furosemide (LASIX) 40 MG tablet Take 1 tablet (40 mg total) by mouth every other day.  . isosorbide mononitrate (IMDUR) 60 MG 24 hr tablet TAKE 1 TABLET(60 MG) BY MOUTH DAILY  . JANUMET 50-500 MG tablet TAKE 1 TABLET BY MOUTH TWICE DAILY WITH A MEAL  . Lancets (ONETOUCH DELICA PLUS UGQBVQ94H) MISC CHECK BLOOD SUGAR BEFORE BREAKFAST AND DINNER  . metoprolol tartrate (LOPRESSOR) 25 MG tablet Take 0.5-1 tablets (12.5-25 mg total) by mouth as needed (PALPITATIONS).  . Misc Natural Products (FOCUSED MIND PO) Take 4 tablets by mouth daily. For memory  . montelukast (SINGULAIR) 10 MG tablet TAKE 1 TABLET(10 MG) BY MOUTH EVERY  EVENING  . Nebulizers (COMPRESSOR/NEBULIZER) MISC Use as directed  . nitroGLYCERIN (NITROSTAT) 0.4 MG SL tablet Place 1 tablet (0.4 mg total) under the tongue every 5 (five) minutes as needed for chest pain.  . pantoprazole (PROTONIX) 40 MG tablet TAKE 1 TABLET(40 MG) BY MOUTH DAILY  . Polyvinyl Alcohol-Povidone PF (REFRESH) 1.4-0.6 % SOLN Place 1-2 drops into both eyes 3 (three) times daily as needed (dry/irritated eyes.).  [name] potassium chloride SA  (KLOR-CON) 20 MEQ tablet Take 1 tablet (20 mEq total) by mouth every other day.  . prednisoLONE acetate (PRED FORTE) 1 % ophthalmic suspension Place 1 drop into both eyes 4 (four) times daily.   . pregabalin (LYRICA) 75 MG capsule TAKE 1 CAPSULE(75 MG) BY MOUTH TWICE DAILY  . Travoprost, BAK Free, (TRAVATAN) 0.004 % SOLN ophthalmic solution Place 1 drop into the right eye at bedtime.   [name] umeclidinium-vilanterol (ANORO ELLIPTA) 62.5-25 MCG/INH AEPB Inhale 1 puff into the lungs daily.   No facility-administered encounter medications on file as of 06/07/2020.    Patient Active Problem List   Diagnosis Date Noted  . Back pain 05/24/2020  . 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 heart failure ([place]) 04/16/2020  . 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 Chronic diastolic heart failure ([place]) 03/28/2020  . Uncontrolled type 2 diabetes mellitus with hyperglycemia ([place]) 07/25/2019  . Hiatal hernia 07/25/2019  . Spontaneous ecchymoses 07/25/2019  . Class 1 obesity due to excess calories with serious comorbidity and body mass index (BMI) of 32.0 to 32.9 in adult 07/25/2019  . Peripheral vascular disease, unspecified ([place]) 03/23/2019  . 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 Fibromyalgia 06/01/2017  . DDD (degenerative disc disease), lumbar 06/01/2017  . 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 History of hypercholesterolemia 06/01/2017  . History of anxiety 06/01/2017  . History of IBS 06/01/2017  . Premature atrial contractions 03/30/2017  . PVC's (premature ventricular contractions) 03/30/2017  . Diabetes mellitus without complication ([place]) 94/76/5465  . Angina pectoris ([place]) 01/15/2017  . CAD (coronary artery disease) 01/12/2017  . Shortness of breath 12/12/2016  . Acute maxillary sinusitis 05/03/2014  . Obstructive sleep apnea 11/04/2013  . Multiple lung nodules 07/18/2012  . Heart palpitations 03/11/2012  . HTN  (hypertension) 03/11/2012  . Gastroesophageal reflux disease without esophagitis 07/13/2011  . Sarcoidosis 02/08/2011  . COPD with acute exacerbation ([place]) 02/05/2011  . Hyperlipemia 09/24/2010  . Atypical chest pain 09/24/2010  . NSVT (nonsustained ventricular tachycardia) ([place]) 09/24/2010  . Leg edema 09/24/2010    Conditions to be addressed/monitored:Uncontrolled type two DM with hyperglycemia, Vitamin D deficiency,  Fibromyalgia, Hypertension, Peripheral Vascular disease, Lymphedema of both lower extremities  Care Plan : COVID 19  Updates made by Lynne Logan, RN since 06/07/2020 12:00 AM    Problem: COVID 19 Infection   Priority: High    Goal: COVID 19 Infection -complications minimized or prevented   Start Date: 06/05/2020  Expected End Date: 07/05/2020  This Visit's Progress: On track  Recent Progress: On track  Priority: High  Note:   Current Barriers:   Ineffective Self Health Maintenance  Clinical Goal(s):  [name] Collaboration with Glendale Chard, MD regarding development and update of comprehensive plan of care as evidenced by provider attestation and co-signature . Inter-disciplinary care team collaboration (see longitudinal plan of care)  patient will work with care management team to address care coordination and chronic disease management needs related to Disease Management  Educational Needs  Care Coordination  Medication  Management and Education  Psychosocial Support   Interventions:  06/05/20 completed successful outbound call to patient   Evaluation of current treatment plan related to  COVID 19 , self-management and patient's adherence to plan as established by provider.  Collaboration with Glendale Chard, MD regarding development and update of comprehensive plan of care as evidenced by provider attestation       and co-signature  Inter-disciplinary care team collaboration (see longitudinal plan of care) . Determined patient tested positive for COVID  19 on 06/02/20 after becoming ill 2 days prior following her husband experiencing symptoms  . Discussed patient contacted her Pulmonologist, Dr. Annamaria Boots to make him aware but did not receive a call back . Determined per message noted in patient's chart, Dr. Annamaria Boots has sent a referral to [facility] for treatment of COVID 19 via monoclonial infusion . Educated patient on what the monoclonial infusion is and how it is scheduled and administered, discussed patient should receive a call from the [facility]  . Determined patient is experiencing nasal congestion, mild shortness of breath, fatigue, body aches and slight cough . Determined patient is taking zinc, tylenol, Vitamin C and Singulair in addition to her other medications, she believes she is staying well hydrated, reports she feels some better today but has been sleeping most days since contracting symptoms, patient's oxygen saturation is running 96-98% per home monitoring   . Sent message to PCP Dr. Glendale Chard with a status update concerning patient's COVID 19 infection and recommendations/referral per Dr. Annamaria Boots to receive the monoclonial antibody infusion   Discussed plans with patient for ongoing care management follow up and provided patient with direct contact information for care management team 06/07/20 completed successful outbound call to patient  . Determined patient missed a call from the [facility] infusion clinic, she returned the call to the hotline number provided but has not received a return call . Determined patient continues to have a productive cough with green/yellow phlem noted . Educated on use of Albuterol inhaler as directed to help open airway, educated on importance of using deep breathing exercises and instructed patient on pursed lip breathing . Sent secure message to Janine Ores RN listed for COVID infusion clinic regarding patient's need for infusion and her attempts to return the call for a screening, requested  she contact the patient and provided the contact number for Sheila Reynolds . Discussed plans with patient for ongoing care management follow up and provided patient with direct contact information for care management team Self Care Activities:  . Contact your healthcare team if symptoms worsen . Continue to stay well hydrated . Expect a call from the [facility] concerning the monoclonial infusion  . Get plenty of rest and continue to follow CDC guidelines for quarrentine as discussed  . If you develop severe shortness of breath or if your oxygen level drops below 90% call 911 or go to the nearest ED Patient Goals: - to recover from [place] 19  Follow Up Plan: Telephone follow up appointment with care management team member scheduled for: 06/07/20    Plan:The care management team will reach out to the patient again over the next 7-10 days.  Barb Merino, RN, BSN, CCM Care Management Coordinator [facility]  Direct Phone: [phone]

## 2020-06-07 NOTE — Patient Instructions (Signed)
Goals Addressed    . COVID 19 Infection - minimized or prevented   On track    Timeframe:  Short-Term Goal Priority:  High Start Date:  06/05/20                          Expected End Date:  07/05/20      Next Follow Up date: 06/07/20        Self Care Activities:  . Contact your healthcare team if symptoms worsen . Continue to stay well hydrated . Expect a call from the [place] clinic concerning the monoclonial infusion  . Get plenty of rest and continue to follow CDC guidelines for quarrentine as discussed  . If you develop severe shortness of breath or if your oxygen level drops below 90% call 911 or go to the nearest ED Patient Goals: - to recover from COVID 19

## 2020-06-08 ENCOUNTER — Ambulatory Visit (INDEPENDENT_AMBULATORY_CARE_PROVIDER_SITE_OTHER): Payer: Medicare Other

## 2020-06-08 DIAGNOSIS — J449 Chronic obstructive pulmonary disease, unspecified: Secondary | ICD-10-CM

## 2020-06-08 DIAGNOSIS — I110 Hypertensive heart disease with heart failure: Secondary | ICD-10-CM

## 2020-06-08 DIAGNOSIS — I25119 Atherosclerotic heart disease of native coronary artery with unspecified angina pectoris: Secondary | ICD-10-CM

## 2020-06-08 DIAGNOSIS — U071 COVID-19: Secondary | ICD-10-CM

## 2020-06-08 DIAGNOSIS — D869 Sarcoidosis, unspecified: Secondary | ICD-10-CM

## 2020-06-08 MED ORDER — FAMOTIDINE IN NACL 20-0.9 MG/50ML-% IV SOLN: 20.0000 mg | Freq: Once | INTRAVENOUS | Status: AC | PRN

## 2020-06-08 MED ORDER — METHYLPREDNISOLONE SODIUM SUCC 125 MG IJ SOLR: 125.0000 mg | Freq: Once | INTRAMUSCULAR | Status: AC | PRN

## 2020-06-08 MED ORDER — ALBUTEROL SULFATE HFA 108 (90 BASE) MCG/ACT IN AERS: 2.0000 | INHALATION_SPRAY | Freq: Once | RESPIRATORY_TRACT | Status: AC | PRN

## 2020-06-08 MED ORDER — BEBTELOVIMAB 175 MG/2 ML IV (EUA)
175.0000 mg | Freq: Once | INTRAMUSCULAR | Status: AC
  Administered 2020-06-08: 175 mg via INTRAVENOUS

## 2020-06-08 MED ORDER — EPINEPHRINE 0.3 MG/0.3ML IJ SOAJ: 0.3000 mg | Freq: Once | INTRAMUSCULAR | Status: AC | PRN

## 2020-06-08 MED ORDER — SODIUM CHLORIDE 0.9 % IV SOLN: INTRAVENOUS | Status: DC | PRN

## 2020-06-08 MED ORDER — DIPHENHYDRAMINE HCL 50 MG/ML IJ SOLN: 50.0000 mg | Freq: Once | INTRAMUSCULAR | Status: AC | PRN

## 2020-06-08 NOTE — Progress Notes (Signed)
Diagnosis: Covid  Provider:  Marshell Garfinkel, MD  Procedure: Injection  IV Type: Peripheral, IV Location: L Forearm  Bebtelovimab, Dose: 175mg   Infusion Start Time: 1602  Infusion Stop Time: 1603  Post Infusion IV Care: Observation period completed and Peripheral IV Discontinued  Discharge: Condition: Good, Destination: Home . AVS provided to patient.   Performed by:  Janine Ores, RN

## 2020-06-08 NOTE — Patient Instructions (Signed)
What types of side effects do monoclonal antibody drugs cause?  Common side effects  In general, the more common side effects caused by monoclonal antibody drugs include: . Allergic reactions, such as hives or itching . Flu-like signs and symptoms, including chills, fatigue, fever, and muscle aches and pains . Nausea, vomiting . Diarrhea . Skin rashes . Low blood pressure   Currently, there are no data on the safety and efficacy of mRNA COVID-19 vaccines in persons who received monoclonal antibodies or convalescent plasma as part of COVID-19 treatment. Based on the estimated half-life of such therapies as well as evidence suggesting that reinfection is uncommon in the 90 days after initial infection, vaccination should be deferred for at least 90 days, as a precautionary measure until additional information becomes available, to avoid interference of the antibody treatment with vaccine-induced immune responses.  If someone you know is interested in receiving treatment please have them contact their MD for a referral or visit [email or web address]

## 2020-06-13 ENCOUNTER — Encounter (HOSPITAL_COMMUNITY): Payer: Medicare Other | Admitting: Physical Therapy

## 2020-06-13 ENCOUNTER — Telehealth: Payer: Medicare Other

## 2020-06-14 ENCOUNTER — Ambulatory Visit: Payer: Medicare Other | Admitting: Cardiovascular Disease

## 2020-06-15 ENCOUNTER — Telehealth: Payer: Medicare Other

## 2020-06-15 ENCOUNTER — Ambulatory Visit (HOSPITAL_COMMUNITY): Payer: Medicare Other | Admitting: Physical Therapy

## 2020-06-15 ENCOUNTER — Telehealth: Payer: Self-pay

## 2020-06-15 NOTE — Telephone Encounter (Signed)
  Chronic Care Management   Outreach Note  06/15/2020 Name: Sheila Reynolds MRN: [id number] DOB: [birth year]  Referred by: Glendale Chard, MD Reason for referral : Chronic Care Management (RN CM FU Call Attempt)   An unsuccessful telephone outreach was attempted today. I spoke with [name] briefly today, unfortunately, she is unavailable to speak with me today. The patient was referred to the case management team for assistance with care management and care coordination.   Follow Up Plan: The care management team will reach out to the patient again over the next 7-10 days.   Barb Merino, RN, BSN, CCM Care Management Coordinator [place] [facility]  Direct Phone: [phone]

## 2020-06-18 ENCOUNTER — Other Ambulatory Visit: Payer: Self-pay

## 2020-06-18 ENCOUNTER — Ambulatory Visit (HOSPITAL_COMMUNITY): Payer: Medicare Other | Attending: Internal Medicine | Admitting: Physical Therapy

## 2020-06-18 ENCOUNTER — Encounter (HOSPITAL_COMMUNITY): Payer: Self-pay | Admitting: Physical Therapy

## 2020-06-18 DIAGNOSIS — I890 Lymphedema, not elsewhere classified: Secondary | ICD-10-CM | POA: Insufficient documentation

## 2020-06-18 LAB — RESPIRATORY CULTURE OR RESPIRATORY AND SPUTUM CULTURE
MICRO NUMBER:: 11743475
SPECIMEN QUALITY:: ADEQUATE

## 2020-06-18 LAB — FUNGUS CULTURE W SMEAR
MICRO NUMBER:: 11743474
SMEAR:: NONE SEEN
SPECIMEN QUALITY:: ADEQUATE

## 2020-06-18 NOTE — Therapy (Addendum)
[place], [place], [address] Phone: [phone]   Fax:  [phone]  Physical Therapy Evaluation  Patient Details  Name: Sheila Reynolds MRN: [id number] Date of Birth: 08-Oct-1945 Referring Provider (PT): Glendale Chard   Encounter Date: 06/18/2020   PT End of Session - 06/18/20 1604     Visit Number 1    Number of Visits 12    Date for PT Re-Evaluation 07/30/20   pt will not start for two weeks   Authorization Type medicare    Progress Note Due on Visit 10    PT Start Time 1751    PT Stop Time 1600    PT Time Calculation (min) 75 min    Activity Tolerance Patient tolerated treatment well    Behavior During Therapy [facility] for tasks assessed/performed             Past Medical History:  Diagnosis Date   Anemia    3 months ago anemic   Anxiety    on meds   Arthritis    "all over" (01/15/2017)   Asthma    Bronchitis with emphysema    Chest pain    Chronic bronchitis ([place])    Coronary artery disease    a. 01/2017 she underwent orbital atherectomy/DES to the proxmal LAD and PTCA to ostial D2. 2D Echo 01/15/17 showed mild LVH, EF 60-65%, grade 1 DD.   Family history of anesthesia complication    daughter N/V   Fibromyalgia    GERD (gastroesophageal reflux disease)    on meds   Glaucoma    Heart murmur    History of hiatal hernia    Hx of echocardiogram    Echo (03/2013):  Tech limited; Mild focal basal septal hypertrophy, EF 60-65%, normal RVF   Hyperlipidemia    Hypertension    Nonsustained ventricular tachycardia (HCC)    OSA on CPAP    Premature atrial contractions    PVC (premature ventricular contraction)    a. Holter 12/16: NSR, occ PAC,PVCs   Sarcoidosis    Type II diabetes mellitus ([place])     Past Surgical History:  Procedure Laterality Date   BREAST SURGERY     CARDIAC CATHETERIZATION  05/04/2007   reveals overall normal left ventricular systolic function. Ejection fraction 65-70%    CATARACT EXTRACTION W/PHACO Right 07/20/2013   Procedure: CATARACT EXTRACTION PHACO AND INTRAOCULAR LENS PLACEMENT (IOC);  Surgeon: Marylynn Pearson, MD;  Location: [facility];  Service: Ophthalmology;  Laterality: Right;   COLONOSCOPY W/ BIOPSIES AND POLYPECTOMY     COLONOSCOPY WITH PROPOFOL N/A 09/07/2014   Procedure: COLONOSCOPY WITH PROPOFOL;  Surgeon: Juanita Craver, MD;  [facility];  Service: Endoscopy;  Laterality: N/A;   COLONOSCOPY WITH PROPOFOL N/A 01/10/2020   Procedure: COLONOSCOPY WITH PROPOFOL;  Surgeon: Juanita Craver, MD;  [facility];  Service: Endoscopy;  Laterality: N/A;   CORONARY ANGIOPLASTY WITH STENT PLACEMENT  01/15/2017   CORONARY ATHERECTOMY N/A 01/15/2017   Procedure: CORONARY ATHERECTOMY;  Surgeon: Nelva Bush, MD;  Location: [place] [facility];  Service: Cardiovascular;  Laterality: N/A;   CORONARY BALLOON ANGIOPLASTY N/A 01/15/2017   Procedure: CORONARY BALLOON ANGIOPLASTY;  Surgeon: Nelva Bush, MD;  Location: [place] [facility];  Service: Cardiovascular;  Laterality: N/A;   CORONARY STENT INTERVENTION N/A 01/15/2017   Procedure: CORONARY STENT INTERVENTION;  Surgeon: Nelva Bush, MD;  Location: [place] [facility];  Service: Cardiovascular;  Laterality: N/A;   ESOPHAGOGASTRODUODENOSCOPY (EGD) WITH PROPOFOL  N/A 09/07/2014   Procedure: ESOPHAGOGASTRODUODENOSCOPY (EGD) WITH PROPOFOL;  Surgeon: Juanita Craver, MD;  [facility];  Service: Endoscopy;  Laterality: N/A;   EXTERNAL EAR SURGERY Bilateral 1970s   tumors removed   EYE SURGERY Left 2019   cataract extraction    EYE SURGERY Right 02/09/2018   eyelid surgery    INTRAVASCULAR PRESSURE WIRE/FFR STUDY N/A 12/12/2016   Procedure: INTRAVASCULAR PRESSURE WIRE/FFR STUDY;  Surgeon: Nelva Bush, MD;  Location: [place] [facility];  Service: Cardiovascular;  Laterality: N/A;   MINI SHUNT INSERTION Right 07/20/2013   Procedure: INSERTION OF GLAUCOMA FILTRATION DEVICE RIGHT EYE;  Surgeon: Marylynn Pearson, MD;  Location: [facility];  Service: Ophthalmology;  Laterality: Right;   MITOMYCIN C APPLICATION Right 02/16/2692   Procedure: MITOMYCIN C APPLICATION;  Surgeon: Marylynn Pearson, MD;  Location: [facility];  Service: Ophthalmology;  Laterality: Right;   MITOMYCIN C APPLICATION Right 8/54/6270   Procedure: MITOMYCIN C APPLICATION RIGHT EYE;  Surgeon: Marylynn Pearson, MD;  Location: [facility];  Service: Ophthalmology;  Laterality: Right;   PLACEMENT OF BREAST IMPLANTS Bilateral 1992   "took all my breast tissue out; put implants in;fibrocystic breast disease "   POLYPECTOMY  01/10/2020   Procedure: POLYPECTOMY;  Surgeon: Juanita Craver, MD;  [facility];  Service: Endoscopy;;   RIGHT/LEFT HEART CATH AND CORONARY ANGIOGRAPHY N/A 12/12/2016   Procedure: RIGHT/LEFT HEART CATH AND CORONARY ANGIOGRAPHY;  Surgeon: Nelva Bush, MD;  Location: [place] [facility];  Service: Cardiovascular;  Laterality: N/A;   TONSILLECTOMY     TOTAL ABDOMINAL HYSTERECTOMY  1982   TRABECULECTOMY Right 02/21/2015   Procedure: TRABECULECTOMY WITH [facility] ON THE RIGHT EYE;  Surgeon: Marylynn Pearson, MD;  Location: [facility] [facility];  Service: Ophthalmology;  Laterality: Right;    There were no vitals filed for this visit.    Subjective Assessment - 06/18/20 1456     Subjective Sheila Reynolds states that she has been having progressive swelling in both of her LE for over a year now.  She has gone from a size 71/2 to a size 9 shoe.  Her legs are smaller in the mornings.  She has compression garments, and a compression pump.  She is not completing any exercises.  She is not knowledgable about lymphedema.    Pertinent History DM, GERD, PVC<HTN    How long can you sit comfortably? with her feet down her legs will go numb but if she has her legs elevated she is ok    How long can you stand comfortably? 15-20 minutes    How long can you walk comfortably? sometimes she uses a cane.    Patient Stated Goals less swelling , to be able to go up down  steps better    Currently in Pain? Yes    Pain Score 7     Pain Location Leg    Pain Descriptors / Indicators Aching;Throbbing    Pain Type Chronic pain    Pain Onset More than a month ago    Pain Frequency Intermittent    Aggravating Factors  when the swelling is bad    Pain Relieving Factors elevation    Effect of Pain on Daily Activities limits                [facility] PT Assessment - 06/18/20 0001       Assessment   Medical Diagnosis B LE Lymphedema    Referring Provider (PT) Glendale Chard    Onset Date/Surgical Date 06/11/19  Next MD Visit 08/09/2020    Prior Therapy none      Precautions   Precautions --   cellulitis     Balance Screen   Has the patient fallen in the past 6 months Yes    How many times? 1    Has the patient had a decrease in activity level because of a fear of falling?  Yes    Is the patient reluctant to leave their home because of a fear of falling?  No      Home Ec[employer] residence      Prior Function   Level of Independence Independent      Cognition   Overall Cognitive Status Within Functional Limits for tasks assessed            life impact : 50   LYMPHEDEMA/ONCOLOGY QUESTIONNAIRE - 06/18/20 0001       What other symptoms do you have   Are you Having Heaviness or Tightness Yes    Are you having Pain Yes    Are you having pitting edema Yes    Is it Hard or Difficult finding clothes that fit No    Do you have infections No      Lymphedema Stage   Stage STAGE 2 SPONTANEOUSLY IRREVERSIBLE      Lymphedema Assessments   Lymphedema Assessments Lower extremities (P)       Right Lower Extremity Lymphedema   20 cm Proximal to Suprapatella 51.8 cm (P)     10 cm Proximal to Suprapatella 53.8 cm (P)     At Midpatella/Popliteal Crease 40.9 cm (P)     30 cm Proximal to Floor at Lateral Plantar Foot 37.8 cm (P)     20 cm Proximal to Floor at Lateral Plantar Foot 31.1 1 (P)     10 cm Proximal to Floor  at Lateral Malleoli 27 cm (P)     Circumference of ankle/heel 30.9 cm. (P)     5 cm Proximal to 1st MTP Joint 22.3 cm (P)     Across MTP Joint 21.3 cm (P)       Left Lower Extremity Lymphedema   20 cm Proximal to Suprapatella 60.4 cm (P)     10 cm Proximal to Suprapatella 50.4 cm (P)     At Midpatella/Popliteal Crease 41 cm (P)     30 cm Proximal to Floor at Lateral Plantar Foot 38.2 cm (P)     20 cm Proximal to Floor at Lateral Plantar Foot 33.7 cm (P)     10 cm Proximal to Floor at Lateral Malleoli 30 cm (P)     Circumference of ankle/heel 33.3 cm. (P)     5 cm Proximal to 1st MTP Joint 22.8 cm (P)     Across MTP Joint 21.4 cm (P)                      Objective measurements completed on examination: See above findings.       [facility] Adult PT Treatment/Exercise - 06/18/20 0001       Exercises   Exercises Knee/Hip      Knee/Hip Exercises: Seated   Long Arc Quad Both;5 reps;10 reps    Other Seated Knee/Hip Exercises ankle dorsi/plantarflexion, diaphragm breathing and lymph squeeze x 5    Marching Both;5 reps    Abduction/Adduction  Both;5 reps      Manual Therapy   Manual Therapy Compression Bandaging;Manual Lymphatic Drainage (MLD)  Manual therapy comments completed seperate from all other aspects of treatment    Compression Bandaging foam cut and placed in cabinet                    PT Education - 06/18/20 1603     Education Details What lymphedema is , that you can control but not cure lymphedema, how you control lymphedema and the importance of obtaining new compression garments every 6 months    Person(s) Educated Patient    Methods Explanation;Handout;Demonstration    Comprehension Verbalized understanding;Returned demonstration              PT Short Term Goals - 06/18/20 1611       PT SHORT TERM GOAL #1   Title PT to be I in HEP to increse lymphatic circulation    Time 2    Period Weeks    Status New   Pt is not starting for two  weeks   Target Date 07/20/20      PT SHORT TERM GOAL #2   Title Pt to have lost 2 cm fluid in RT LE ; 1 in Lt    Time 2    Period Weeks    Status New    Target Date 07/20/20               PT Long Term Goals - 06/18/20 1613       PT LONG TERM GOAL #1   Title PT Life impact score to decrease by 10 pt s    Time 4    Period Weeks    Status New    Target Date 08/03/20      PT LONG TERM GOAL #2   Title Pt to have lost 3-4 cm in LT LE and 2-3 int RT to allow pain level to be at a 3/10 at the most throughout the day    Time 4    Period Weeks    Status New      PT LONG TERM GOAL #3   Title Pt to be able to sit and stand for 40 minutes at a timewithout increased discomfort.  to improve her functional ability    Time 4    Period Weeks                    Plan - 06/18/20 1605     Clinical Impression Statement Sheila Reynolds is a 75 yo female who has been diagnosed with lymphedema for the past year.  She states that she feels that her swelling is slowly progressing. She has compression garments and a pump and pumps an hour a day but has been referred to skilled PT to attempt to get her swelling and discomfort down.  Evaluation demonstrates increased induration in B LE.  Sheila Reynolds will benefit from skilled PT to decrease her volume of fluid and decrease her pain.    Examination-Activity Limitations Locomotion Level;Stand;Dressing    Examination-Participation Restrictions Yard Work;Shop    Stability/Clinical Decision Making Evolving/Moderate complexity    Clinical Decision Making Moderate    Rehab Potential Good    PT Frequency 3x / week    PT Duration 4 weeks    PT Treatment/Interventions Patient/family education;Manual lymph drainage;Compression bandaging    PT Next Visit Plan begin total decongestive techniques; foam is in the cabinet; teach self manual techniques.    PT Home Exercise Plan ankle pump, LAQ, hip ab/adduction, marching , diaphram breathing, lymph squeeze.  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) ([place]) 04/13/2020   Chronic diastolic heart failure ([place]) 03/28/2020   Uncontrolled type 2 diabetes mellitus with hyperglycemia ([place]) 07/25/2019   Hiatal hernia 07/25/2019   Spontaneous ecchymoses 07/25/2019   Class 1 obesity due to excess calories with serious comorbidity and body mass index (BMI) of 32.0 to 32.9 in adult 07/25/2019   Peripheral vascular disease, unspecified ([place]) 03/23/2019   Uveitis 06/01/2017   Fibromyalgia 06/01/2017   DDD (degenerative disc disease), lumbar 06/01/2017   DDD (degenerative disc disease), thoracic 06/01/2017   History of hypercholesterolemia 06/01/2017   History of anxiety 06/01/2017   History of IBS 06/01/2017   Premature atrial contractions 03/30/2017   PVC's (premature ventricular contractions) 03/30/2017   Diabetes mellitus without complication ([place]) 48/54/6270   Angina pectoris ([place]) 01/15/2017   CAD (coronary artery disease) 01/12/2017   Shortness of breath 12/12/2016   Acute maxillary sinusitis 05/03/2014   Obstructive sleep apnea 11/04/2013   Multiple lung nodules 07/18/2012   Heart palpitations 03/11/2012   HTN (hypertension) 03/11/2012   Gastroesophageal reflux disease without esophagitis 07/13/2011   Sarcoidosis 02/08/2011   COPD with acute exacerbation ([place]) 02/05/2011   Hyperlipemia 09/24/2010   Atypical chest pain 09/24/2010   NSVT (nonsustained ventricular tachycardia) ([place]) 09/24/2010   Leg edema 09/24/2010    Rayetta Humphrey, PT CLT [phone] 06/18/2020, 4:17 PM  [place], [place], [address] Phone: [phone]   Fax:  [phone]  Name: Sheila Reynolds MRN: [id number] Date of Birth: [birth year]

## 2020-06-19 ENCOUNTER — Telehealth: Payer: Self-pay

## 2020-06-19 ENCOUNTER — Ambulatory Visit (INDEPENDENT_AMBULATORY_CARE_PROVIDER_SITE_OTHER): Payer: Medicare Other

## 2020-06-19 ENCOUNTER — Telehealth: Payer: Medicare Other

## 2020-06-19 VITALS — BP 142/74 | HR 78 | Temp 98.1°F | Ht 62.0 in | Wt 170.0 lb

## 2020-06-19 DIAGNOSIS — E538 Deficiency of other specified B group vitamins: Secondary | ICD-10-CM

## 2020-06-19 MED ORDER — CYANOCOBALAMIN 1000 MCG/ML IJ SOLN
1000.0000 ug | Freq: Once | INTRAMUSCULAR | Status: AC
Start: 2020-06-19 — End: 2020-06-19
  Administered 2020-06-19: 1000 ug via INTRAMUSCULAR

## 2020-06-19 NOTE — Progress Notes (Signed)
Pt is here for b12 injection. 

## 2020-06-19 NOTE — Telephone Encounter (Signed)
  Chronic Care Management   Outreach Note  06/19/2020 Name: Sheila Reynolds MRN: [id number] DOB: Sep 20, 1945  Referred by: Glendale Chard, MD Reason for referral : No chief complaint on file.   A second unsuccessful telephone outreach was attempted today. The patient was referred to the case management team for assistance with care management and care coordination.   Follow Up Plan: A HIPAA compliant phone message was left for the patient providing contact information and requesting a return call. Telephone follow up appointment with care management team member scheduled for: 08/07/20  Barb Merino, RN, BSN, CCM Care Management Coordinator [place] [facility]  Direct Phone: [phone]

## 2020-06-20 ENCOUNTER — Ambulatory Visit (HOSPITAL_COMMUNITY): Payer: Medicare Other

## 2020-06-25 ENCOUNTER — Ambulatory Visit (HOSPITAL_COMMUNITY): Payer: Medicare Other | Admitting: Physical Therapy

## 2020-06-25 ENCOUNTER — Ambulatory Visit: Payer: Medicare Other | Admitting: Pulmonary Disease

## 2020-06-26 ENCOUNTER — Other Ambulatory Visit: Payer: Self-pay

## 2020-06-26 ENCOUNTER — Ambulatory Visit (INDEPENDENT_AMBULATORY_CARE_PROVIDER_SITE_OTHER): Payer: Medicare Other

## 2020-06-26 VITALS — BP 142/80 | HR 78 | Temp 98.7°F | Ht 62.4 in | Wt 172.4 lb

## 2020-06-26 DIAGNOSIS — E538 Deficiency of other specified B group vitamins: Secondary | ICD-10-CM | POA: Diagnosis not present

## 2020-06-26 MED ORDER — CYANOCOBALAMIN 1000 MCG/ML IJ SOLN
1000.0000 ug | Freq: Once | INTRAMUSCULAR | Status: AC
  Administered 2020-06-26: 1000 ug via INTRAMUSCULAR

## 2020-06-26 NOTE — Progress Notes (Signed)
Patient presents today for a vitamin 12. YL,RMA

## 2020-06-27 ENCOUNTER — Ambulatory Visit (HOSPITAL_COMMUNITY): Payer: Medicare Other | Admitting: Physical Therapy

## 2020-06-28 ENCOUNTER — Telehealth (HOSPITAL_COMMUNITY): Payer: Self-pay

## 2020-06-28 NOTE — Telephone Encounter (Signed)
Called and spoke with pt in regards to PR, pt stated she is going for lymph treatment for her legs and will like to hold scheduling for PR. Will contact pt at a later date to schedule.

## 2020-07-02 ENCOUNTER — Other Ambulatory Visit: Payer: Self-pay

## 2020-07-02 ENCOUNTER — Ambulatory Visit (HOSPITAL_COMMUNITY): Payer: Medicare Other | Admitting: Physical Therapy

## 2020-07-02 DIAGNOSIS — I890 Lymphedema, not elsewhere classified: Secondary | ICD-10-CM

## 2020-07-02 NOTE — Therapy (Signed)
[facility] [address] [place], [place], [address] Phone: [phone]   Fax:  [phone]  Physical Therapy Treatment  Patient Details  Name: Sheila Reynolds MRN: [id number] Date of Birth: [birth year] Referring Provider (PT): Glendale Chard   Encounter Date: 07/02/2020   PT End of Session - 07/02/20 1712    Visit Number 2    Number of Visits 12    Date for PT Re-Evaluation 07/30/20   pt will not start for two weeks   Authorization Type medicare    Progress Note Due on Visit 10    PT Start Time 1450    PT Stop Time 1600    PT Time Calculation (min) 70 min    Activity Tolerance Patient tolerated treatment well    Behavior During Therapy [facility] for tasks assessed/performed           Past Medical History:  Diagnosis Date  . Anemia    3 months ago anemic  . Anxiety    on meds  . Arthritis    "all over" (01/15/2017)  . Asthma   . Bronchitis with emphysema   . Chest pain   . Chronic bronchitis ([place])   . Coronary artery disease    a. 01/2017 she underwent orbital atherectomy/DES to the proxmal LAD and PTCA to ostial D2. 2D Echo 01/15/17 showed mild LVH, EF 60-65%, grade 1 DD.  . Family history of anesthesia complication    daughter N/V  . Fibromyalgia   . GERD (gastroesophageal reflux disease)    on meds  . Glaucoma   . Heart murmur   . History of hiatal hernia   . Hx of echocardiogram    Echo (03/2013):  Tech limited; Mild focal basal septal hypertrophy, EF 60-65%, normal RVF  . Hyperlipidemia   . Hypertension   . Nonsustained ventricular tachycardia ([place])   . OSA on CPAP   . Premature atrial contractions   . PVC (premature ventricular contraction)    a. Holter 12/16: NSR, occ PAC,PVCs  . Sarcoidosis   . Type II diabetes mellitus ([place])     Past Surgical History:  Procedure Laterality Date  . BREAST SURGERY    . CARDIAC CATHETERIZATION  05/04/2007   reveals overall normal left ventricular systolic function. Ejection  fraction 65-70%  . CATARACT EXTRACTION W/PHACO Right 07/20/2013   Procedure: CATARACT EXTRACTION PHACO AND INTRAOCULAR LENS PLACEMENT (IOC);  Surgeon: Marylynn Pearson, MD;  Location: [facility];  Service: Ophthalmology;  Laterality: Right;  . COLONOSCOPY W/ BIOPSIES AND POLYPECTOMY    . COLONOSCOPY WITH PROPOFOL N/A 09/07/2014   Procedure: COLONOSCOPY WITH PROPOFOL;  Surgeon: Juanita Craver, MD;  [facility];  Service: Endoscopy;  Laterality: N/A;  . COLONOSCOPY WITH PROPOFOL N/A 01/10/2020   Procedure: COLONOSCOPY WITH PROPOFOL;  Surgeon: Juanita Craver, MD;  [facility];  Service: Endoscopy;  Laterality: N/A;  . CORONARY ANGIOPLASTY WITH STENT PLACEMENT  01/15/2017  . CORONARY ATHERECTOMY N/A 01/15/2017   Procedure: CORONARY ATHERECTOMY;  Surgeon: Nelva Bush, MD;  Location: [facility];  Service: Cardiovascular;  Laterality: N/A;  . CORONARY BALLOON ANGIOPLASTY N/A 01/15/2017   Procedure: CORONARY BALLOON ANGIOPLASTY;  Surgeon: Nelva Bush, MD;  Location: [facility];  Service: Cardiovascular;  Laterality: N/A;  . CORONARY STENT INTERVENTION N/A 01/15/2017   Procedure: CORONARY STENT INTERVENTION;  Surgeon: Nelva Bush, MD;  Location: [facility];  Service: Cardiovascular;  Laterality: N/A;  . ESOPHAGOGASTRODUODENOSCOPY (EGD) WITH PROPOFOL N/A 09/07/2014  Procedure: ESOPHAGOGASTRODUODENOSCOPY (EGD) WITH PROPOFOL;  Surgeon: Juanita Craver, MD;  [facility];  Service: Endoscopy;  Laterality: N/A;  . EXTERNAL EAR SURGERY Bilateral 1970s   tumors removed  . EYE SURGERY Left 2019   cataract extraction   . EYE SURGERY Right 02/09/2018   eyelid surgery   . INTRAVASCULAR PRESSURE WIRE/FFR STUDY N/A 12/12/2016   Procedure: INTRAVASCULAR PRESSURE WIRE/FFR STUDY;  Surgeon: Nelva Bush, MD;  Location: [facility];  Service: Cardiovascular;  Laterality: N/A;  . MINI SHUNT INSERTION Right 07/20/2013   Procedure: INSERTION OF GLAUCOMA FILTRATION  DEVICE RIGHT EYE;  Surgeon: Marylynn Pearson, MD;  Location: [facility];  Service: Ophthalmology;  Laterality: Right;  . MITOMYCIN C APPLICATION Right 2/70/6237   Procedure: MITOMYCIN C APPLICATION;  Surgeon: Marylynn Pearson, MD;  Location: [facility];  Service: Ophthalmology;  Laterality: Right;  . MITOMYCIN C APPLICATION Right 08/07/3149   Procedure: MITOMYCIN C APPLICATION RIGHT EYE;  Surgeon: Marylynn Pearson, MD;  Location: [facility];  Service: Ophthalmology;  Laterality: Right;  . PLACEMENT OF BREAST IMPLANTS Bilateral 1992   "took all my breast tissue out; put implants in;fibrocystic breast disease "  . POLYPECTOMY  01/10/2020   Procedure: POLYPECTOMY;  Surgeon: Juanita Craver, MD;  [facility];  Service: Endoscopy;;  . RIGHT/LEFT HEART CATH AND CORONARY ANGIOGRAPHY N/A 12/12/2016   Procedure: RIGHT/LEFT HEART CATH AND CORONARY ANGIOGRAPHY;  Surgeon: Nelva Bush, MD;  Location: [facility] [facility] [facility];  Service: Cardiovascular;  Laterality: N/A;  . TONSILLECTOMY    . TOTAL ABDOMINAL HYSTERECTOMY  1982  . TRABECULECTOMY Right 02/21/2015   Procedure: TRABECULECTOMY WITH [facility] ON THE RIGHT EYE;  Surgeon: Marylynn Pearson, MD;  Location: [facility];  Service: Ophthalmology;  Laterality: Right;    There were no vitals filed for this visit.   Subjective Assessment - 07/02/20 1709    Subjective pt states she is ready to begin her bandaging.  STate sshe is having no issues other than her LE's hurt sometimes.    Currently in Pain? No/denies                             [facility] Adult PT Treatment/Exercise - 07/02/20 0001      Manual Therapy   Manual Therapy Compression Bandaging;Manual Lymphatic Drainage (MLD)    Manual therapy comments completed seperate from all other aspects of treatment    Manual Lymphatic Drainage (MLD) completed both anterior and posterior for distal bil LE's including short neck, superficial and deep abdominals with bil inguinal axillary anastomosis    Compression Bandaging for  Bilateral distal LE's using 1/2" foam and multilayer short stretch bandaging.                    PT Short Term Goals - 06/18/20 1611      PT SHORT TERM GOAL #1   Title PT to be I in HEP to increse lymphatic circulation    Time 2    Period Weeks    Status New   Pt is not starting for two weeks   Target Date 07/20/20      PT SHORT TERM GOAL #2   Title Pt to have lost 2 cm fluid in RT LE ; 1 in Lt    Time 2    Period Weeks    Status New    Target Date 07/20/20             PT Long Term  Goals - 06/18/20 1613      PT LONG TERM GOAL #1   Title PT Life impact score to decrease by 10 pt s    Time 4    Period Weeks    Status New    Target Date 08/03/20      PT LONG TERM GOAL #2   Title Pt to have lost 3-4 cm in LT LE and 2-3 int RT to allow pain level to be at a 3/10 at the most throughout the day    Time 4    Period Weeks    Status New      PT LONG TERM GOAL #3   Title Pt to be able to sit and stand for 40 minutes at a timewithout increased discomfort.  to improve her functional ability    Time 4    Period Weeks                 Plan - 07/02/20 1740    Clinical Impression Statement Educated on POC and goals set for therapy.  Explained process of manual lymph drainage and compression bandaging.  Instructed to remove/roll bandaging prior to next appt and to discard netting and toe bandaging and to remove bandages if too tight or numbness.  Manual lymph drainage began f/b compression bandaigng using 1/2" foam and multilayer short stretch.  Toe bandaging was also used this visit.  Pt will need to be measured each week for progress.  I    Examination-Activity Limitations Locomotion Level;Stand;Dressing    Examination-Participation Restrictions Yard Work;Shop    Stability/Clinical Decision Making Evolving/Moderate complexity    Rehab Potential Good    PT Frequency 3x / week    PT Duration 4 weeks    PT Treatment/Interventions Patient/family education;Manual  lymph drainage;Compression bandaging    PT Next Visit Plan Continue with total decongestive techniques.Teach self manual techniques.    PT Home Exercise Plan ankle pump, LAQ, hip ab/adduction, marching , diaphram breathing, lymph squeeze.    Consulted and Agree with Plan of Care Patient           Patient will benefit from skilled therapeutic intervention in order to improve the following deficits and impairments:  Pain,Increased edema  Visit Diagnosis: Lymphedema, not elsewhere classified     Problem List Patient Active Problem List   Diagnosis Date Noted  . Back pain 05/24/2020  . 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 heart failure ([place]) 04/16/2020  . 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 Chronic diastolic heart failure ([place]) 03/28/2020  . Uncontrolled type 2 diabetes mellitus with hyperglycemia ([place]) 07/25/2019  . Hiatal hernia 07/25/2019  . Spontaneous ecchymoses 07/25/2019  . Class 1 obesity due to excess calories with serious comorbidity and body mass index (BMI) of 32.0 to 32.9 in adult 07/25/2019  . Peripheral vascular disease, unspecified ([place]) 03/23/2019  . 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 Fibromyalgia 06/01/2017  . DDD (degenerative disc disease), lumbar 06/01/2017  . 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 History of hypercholesterolemia 06/01/2017  . History of anxiety 06/01/2017  . History of IBS 06/01/2017  . Premature atrial contractions 03/30/2017  . PVC's (premature ventricular contractions) 03/30/2017  . Diabetes mellitus without complication ([place]) 60/45/4098  . Angina pectoris ([place]) 01/15/2017  . CAD (coronary artery disease) 01/12/2017  . Shortness of breath 12/12/2016  . Acute maxillary sinusitis 05/03/2014  . Obstructive sleep apnea 11/04/2013  . Multiple lung nodules 07/18/2012  . Heart palpitations 03/11/2012  .  HTN (hypertension) 03/11/2012  . Gastroesophageal reflux  disease without esophagitis 07/13/2011  . Sarcoidosis 02/08/2011  . COPD with acute exacerbation ([place]) 02/05/2011  . Hyperlipemia 09/24/2010  . Atypical chest pain 09/24/2010  . NSVT (nonsustained ventricular tachycardia) ([place]) 09/24/2010  . Leg edema 09/24/2010   Teena Irani, PTA/CLT [phone]  Teena Irani 07/02/2020, 5:42 PM  [facility] [address] [place], [place], [address] Phone: [phone]   Fax:  [phone]  Name: Sheila Reynolds MRN: [id number] Date of Birth: 07/04/1945

## 2020-07-03 LAB — AFB CULTURE WITH SMEAR (NOT AT ARMC)
Acid Fast Culture: NEGATIVE
Acid Fast Smear: NEGATIVE

## 2020-07-04 ENCOUNTER — Other Ambulatory Visit: Payer: Self-pay

## 2020-07-04 ENCOUNTER — Ambulatory Visit (HOSPITAL_COMMUNITY): Payer: Medicare Other | Admitting: Physical Therapy

## 2020-07-04 DIAGNOSIS — I890 Lymphedema, not elsewhere classified: Secondary | ICD-10-CM | POA: Diagnosis not present

## 2020-07-04 NOTE — Therapy (Signed)
[facility] [address] [place], [place], [address] Phone: [phone]   Fax:  [phone]  Physical Therapy Treatment  Patient Details  Name: Sheila Reynolds MRN: [id number] Date of Birth: 29-Jan-1946 Referring Provider (PT): Glendale Chard   Encounter Date: 07/04/2020   PT End of Session - 07/04/20 1630    Visit Number 3    Number of Visits 12    Date for PT Re-Evaluation 07/30/20   pt will not start for two weeks   Authorization Type medicare    Progress Note Due on Visit 10    PT Start Time 7564    PT Stop Time 1615    PT Time Calculation (min) 80 min    Activity Tolerance Patient tolerated treatment well    Behavior During Therapy [facility] for tasks assessed/performed           Past Medical History:  Diagnosis Date  . Anemia    3 months ago anemic  . Anxiety    on meds  . Arthritis    "all over" (01/15/2017)  . Asthma   . Bronchitis with emphysema   . Chest pain   . Chronic bronchitis ([place])   . Coronary artery disease    a. 01/2017 she underwent orbital atherectomy/DES to the proxmal LAD and PTCA to ostial D2. 2D Echo 01/15/17 showed mild LVH, EF 60-65%, grade 1 DD.  . Family history of anesthesia complication    daughter N/V  . Fibromyalgia   . GERD (gastroesophageal reflux disease)    on meds  . Glaucoma   . Heart murmur   . History of hiatal hernia   . Hx of echocardiogram    Echo (03/2013):  Tech limited; Mild focal basal septal hypertrophy, EF 60-65%, normal RVF  . Hyperlipidemia   . Hypertension   . Nonsustained ventricular tachycardia ([place])   . OSA on CPAP   . Premature atrial contractions   . PVC (premature ventricular contraction)    a. Holter 12/16: NSR, occ PAC,PVCs  . Sarcoidosis   . Type II diabetes mellitus ([place])     Past Surgical History:  Procedure Laterality Date  . BREAST SURGERY    . CARDIAC CATHETERIZATION  05/04/2007   reveals overall normal left ventricular systolic function. Ejection  fraction 65-70%  . CATARACT EXTRACTION W/PHACO Right 07/20/2013   Procedure: CATARACT EXTRACTION PHACO AND INTRAOCULAR LENS PLACEMENT (IOC);  Surgeon: Marylynn Pearson, MD;  Location: [facility];  Service: Ophthalmology;  Laterality: Right;  . COLONOSCOPY W/ BIOPSIES AND POLYPECTOMY    . COLONOSCOPY WITH PROPOFOL N/A 09/07/2014   Procedure: COLONOSCOPY WITH PROPOFOL;  Surgeon: Juanita Craver, MD;  [facility];  Service: Endoscopy;  Laterality: N/A;  . COLONOSCOPY WITH PROPOFOL N/A 01/10/2020   Procedure: COLONOSCOPY WITH PROPOFOL;  Surgeon: Juanita Craver, MD;  [facility];  Service: Endoscopy;  Laterality: N/A;  . CORONARY ANGIOPLASTY WITH STENT PLACEMENT  01/15/2017  . CORONARY ATHERECTOMY N/A 01/15/2017   Procedure: CORONARY ATHERECTOMY;  Surgeon: Nelva Bush, MD;  Location: [facility];  Service: Cardiovascular;  Laterality: N/A;  . CORONARY BALLOON ANGIOPLASTY N/A 01/15/2017   Procedure: CORONARY BALLOON ANGIOPLASTY;  Surgeon: Nelva Bush, MD;  Location: [facility] [facility];  Service: Cardiovascular;  Laterality: N/A;  . CORONARY STENT INTERVENTION N/A 01/15/2017   Procedure: CORONARY STENT INTERVENTION;  Surgeon: Nelva Bush, MD;  Location: [facility] [facility];  Service: Cardiovascular;  Laterality: N/A;  . ESOPHAGOGASTRODUODENOSCOPY (EGD) WITH PROPOFOL N/A 09/07/2014  Procedure: ESOPHAGOGASTRODUODENOSCOPY (EGD) WITH PROPOFOL;  Surgeon: Juanita Craver, MD;  [facility];  Service: Endoscopy;  Laterality: N/A;  . EXTERNAL EAR SURGERY Bilateral 1970s   tumors removed  . EYE SURGERY Left 2019   cataract extraction   . EYE SURGERY Right 02/09/2018   eyelid surgery   . INTRAVASCULAR PRESSURE WIRE/FFR STUDY N/A 12/12/2016   Procedure: INTRAVASCULAR PRESSURE WIRE/FFR STUDY;  Surgeon: Nelva Bush, MD;  Location: [facility];  Service: Cardiovascular;  Laterality: N/A;  . MINI SHUNT INSERTION Right 07/20/2013   Procedure: INSERTION OF GLAUCOMA FILTRATION  DEVICE RIGHT EYE;  Surgeon: Marylynn Pearson, MD;  Location: [facility];  Service: Ophthalmology;  Laterality: Right;  . MITOMYCIN C APPLICATION Right 4/48/1856   Procedure: MITOMYCIN C APPLICATION;  Surgeon: Marylynn Pearson, MD;  Location: [facility];  Service: Ophthalmology;  Laterality: Right;  . MITOMYCIN C APPLICATION Right 04/23/9700   Procedure: MITOMYCIN C APPLICATION RIGHT EYE;  Surgeon: Marylynn Pearson, MD;  Location: [facility];  Service: Ophthalmology;  Laterality: Right;  . PLACEMENT OF BREAST IMPLANTS Bilateral 1992   "took all my breast tissue out; put implants in;fibrocystic breast disease "  . POLYPECTOMY  01/10/2020   Procedure: POLYPECTOMY;  Surgeon: Juanita Craver, MD;  [facility];  Service: Endoscopy;;  . RIGHT/LEFT HEART CATH AND CORONARY ANGIOGRAPHY N/A 12/12/2016   Procedure: RIGHT/LEFT HEART CATH AND CORONARY ANGIOGRAPHY;  Surgeon: Nelva Bush, MD;  Location: [facility];  Service: Cardiovascular;  Laterality: N/A;  . TONSILLECTOMY    . TOTAL ABDOMINAL HYSTERECTOMY  1982  . TRABECULECTOMY Right 02/21/2015   Procedure: TRABECULECTOMY WITH [facility] ON THE RIGHT EYE;  Surgeon: Marylynn Pearson, MD;  Location: [facility];  Service: Ophthalmology;  Laterality: Right;    There were no vitals filed for this visit.   Subjective Assessment - 07/04/20 1622    Subjective pt states she could tell a big difference when she removed her bandages.  States she does have edema that goes up mid thigh so would like to begin wrapping the upper thighs as well.    Currently in Pain? No/denies                 LYMPHEDEMA/ONCOLOGY QUESTIONNAIRE - 07/04/20 1626      Lymphedema Assessments   Lymphedema Assessments Lower extremities      Right Lower Extremity Lymphedema   20 cm Proximal to Suprapatella 58 cm   was 51.8 on 5/9   10 cm Proximal to Suprapatella 52.5 cm   was 53.8   At Midpatella/Popliteal Crease 43 cm   was 40.9   30 cm Proximal to Floor at Lateral Plantar Foot 37.2 cm   was 37.8   20  cm Proximal to Floor at Lateral Plantar Foot 29.5 1   was 31.1   10 cm Proximal to Floor at Lateral Malleoli 26 cm   was 27   Circumference of ankle/heel 31.5 cm.   was 20.9   5 cm Proximal to 1st MTP Joint 22.2 cm   was 22.3   Across MTP Joint 21 cm   was 23.3   Around Proximal Great Toe 8.3 cm   n/t at eval     Left Lower Extremity Lymphedema   20 cm Proximal to Suprapatella 58 cm   was 60.7 on 5/9   10 cm Proximal to Suprapatella 50.4 cm   was 50.4   At Midpatella/Popliteal Crease 42 cm   was 41   30 cm Proximal to Floor at Lateral  Plantar Foot 37 cm   was 38.2   20 cm Proximal to Floor at Lateral Plantar Foot 31.5 cm   was 33.7   10 cm Proximal to Floor at Lateral Malleoli 26.8 cm   was 30   Circumference of ankle/heel 32.8 cm.   was 33.3   5 cm Proximal to 1st MTP Joint 23 cm   was 22.8   Across MTP Joint 21 cm   was 21.4   Around Proximal Great Toe 8.4 cm   n/t at eval                     [facility] Adult PT Treatment/Exercise - 07/04/20 0001      Manual Therapy   Manual Therapy Compression Bandaging;Manual Lymphatic Drainage (MLD);Other (comment)    Manual therapy comments completed seperate from all other aspects of treatment    Manual Lymphatic Drainage (MLD) completed both anterior and posterior for distal bil LE's including short neck, superficial and deep abdominals with bil inguinal axillary anastomosis    Compression Bandaging for Bilateral distal LE's using 1/2" foam and multilayer short stretch bandaging.    Other Manual Therapy measurements; foam cut for thighs                    PT Short Term Goals - 06/18/20 1611      PT SHORT TERM GOAL #1   Title PT to be I in HEP to increse lymphatic circulation    Time 2    Period Weeks    Status New   Pt is not starting for two weeks   Target Date 07/20/20      PT SHORT TERM GOAL #2   Title Pt to have lost 2 cm fluid in RT LE ; 1 in Lt    Time 2    Period Weeks    Status New    Target Date 07/20/20              PT Long Term Goals - 06/18/20 1613      PT LONG TERM GOAL #1   Title PT Life impact score to decrease by 10 pt s    Time 4    Period Weeks    Status New    Target Date 08/03/20      PT LONG TERM GOAL #2   Title Pt to have lost 3-4 cm in LT LE and 2-3 int RT to allow pain level to be at a 3/10 at the most throughout the day    Time 4    Period Weeks    Status New      PT LONG TERM GOAL #3   Title Pt to be able to sit and stand for 40 minutes at a timewithout increased discomfort.  to improve her functional ability    Time 4    Period Weeks                 Plan - 07/04/20 1637    Clinical Impression Statement Pt pleased with first wrapping and happy to see her LE's smaller when banaging removed.  Measured this session with noted reduction inferior knee to ankle.  Not much change in thighs or feet and increased around bil patellar regions.  Discussed adding thighs and pt verbalized she would like to do this and is willing to transition to thigh highs following.  Foam cut for thighs and placed in cabinet.  Manual lymph drainage continued, LE's moisturized  and rebandaged using multilayer short stretch and " foam.    Examination-Activity Limitations Locomotion Level;Stand;Dressing    Examination-Participation Restrictions Yard Work;Shop    Stability/Clinical Decision Making Evolving/Moderate complexity    Rehab Potential Good    PT Frequency 3x / week    PT Duration 4 weeks    PT Treatment/Interventions Patient/family education;Manual lymph drainage;Compression bandaging    PT Next Visit Plan Continue with total decongestive techniques.Teach self manual techniques.  Begin wrapping to thighs.    PT Home Exercise Plan ankle pump, LAQ, hip ab/adduction, marching , diaphram breathing, lymph squeeze.    Consulted and Agree with Plan of Care Patient           Patient will benefit from skilled therapeutic intervention in order to improve the following deficits and  impairments:  Pain,Increased edema  Visit Diagnosis: Lymphedema, not elsewhere classified     Problem List Patient Active Problem List   Diagnosis Date Noted  . Back pain 05/24/2020  . 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 heart failure ([place]) 04/16/2020  . 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) ([place]) 04/13/2020  . Chronic diastolic heart failure ([place]) 03/28/2020  . Uncontrolled type 2 diabetes mellitus with hyperglycemia ([place]) 07/25/2019  . Hiatal hernia 07/25/2019  . Spontaneous ecchymoses 07/25/2019  . Class 1 obesity due to excess calories with serious comorbidity and body mass index (BMI) of 32.0 to 32.9 in adult 07/25/2019  . Peripheral vascular disease, unspecified ([place]) 03/23/2019  . 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 Fibromyalgia 06/01/2017  . DDD (degenerative disc disease), lumbar 06/01/2017  . 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 History of hypercholesterolemia 06/01/2017  . History of anxiety 06/01/2017  . History of IBS 06/01/2017  . Premature atrial contractions 03/30/2017  . PVC's (premature ventricular contractions) 03/30/2017  . Diabetes mellitus without complication ([place]) 29/79/8921  . Angina pectoris ([place]) 01/15/2017  . CAD (coronary artery disease) 01/12/2017  . Shortness of breath 12/12/2016  . Acute maxillary sinusitis 05/03/2014  . Obstructive sleep apnea 11/04/2013  . Multiple lung nodules 07/18/2012  . Heart palpitations 03/11/2012  . HTN (hypertension) 03/11/2012  . Gastroesophageal reflux disease without esophagitis 07/13/2011  . Sarcoidosis 02/08/2011  . COPD with acute exacerbation ([place]) 02/05/2011  . Hyperlipemia 09/24/2010  . Atypical chest pain 09/24/2010  . NSVT (nonsustained ventricular tachycardia) ([place]) 09/24/2010  . Leg edema 09/24/2010   Teena Irani, PTA/CLT [phone]  Teena Irani 07/04/2020, 4:38 PM  [facility] [address] [place], [place], [address] Phone: [phone]   Fax:  [phone]  Name: Sheila Reynolds MRN: [id number] Date of Birth: 08-24-1945

## 2020-07-06 ENCOUNTER — Other Ambulatory Visit: Payer: Self-pay

## 2020-07-06 ENCOUNTER — Ambulatory Visit (HOSPITAL_COMMUNITY): Payer: Medicare Other

## 2020-07-06 ENCOUNTER — Encounter (HOSPITAL_COMMUNITY): Payer: Self-pay

## 2020-07-06 DIAGNOSIS — I890 Lymphedema, not elsewhere classified: Secondary | ICD-10-CM | POA: Diagnosis not present

## 2020-07-06 NOTE — Therapy (Signed)
[facility] [address] [place], [place], [address] Phone: [phone]   Fax:  [phone]  Physical Therapy Treatment  Patient Details  Name: Sheila Reynolds MRN: [id number] Date of Birth: 1946-01-03 Referring Provider (PT): Glendale Chard   Encounter Date: 07/06/2020   PT End of Session - 07/06/20 1848    Visit Number 4    Number of Visits 12    Date for PT Re-Evaluation 07/30/20    Authorization Type medicare    Progress Note Due on Visit 10    PT Start Time 1534    PT Stop Time 1655    PT Time Calculation (min) 81 min    Activity Tolerance Patient tolerated treatment well    Behavior During Therapy [facility] for tasks assessed/performed           Past Medical History:  Diagnosis Date  . Anemia    3 months ago anemic  . Anxiety    on meds  . Arthritis    "all over" (01/15/2017)  . Asthma   . Bronchitis with emphysema   . Chest pain   . Chronic bronchitis ([place])   . Coronary artery disease    a. 01/2017 she underwent orbital atherectomy/DES to the proxmal LAD and PTCA to ostial D2. 2D Echo 01/15/17 showed mild LVH, EF 60-65%, grade 1 DD.  . Family history of anesthesia complication    daughter N/V  . Fibromyalgia   . GERD (gastroesophageal reflux disease)    on meds  . Glaucoma   . Heart murmur   . History of hiatal hernia   . Hx of echocardiogram    Echo (03/2013):  Tech limited; Mild focal basal septal hypertrophy, EF 60-65%, normal RVF  . Hyperlipidemia   . Hypertension   . Nonsustained ventricular tachycardia ([place])   . OSA on CPAP   . Premature atrial contractions   . PVC (premature ventricular contraction)    a. Holter 12/16: NSR, occ PAC,PVCs  . Sarcoidosis   . Type II diabetes mellitus ([place])     Past Surgical History:  Procedure Laterality Date  . BREAST SURGERY    . CARDIAC CATHETERIZATION  05/04/2007   reveals overall normal left ventricular systolic function. Ejection fraction 65-70%  . CATARACT  EXTRACTION W/PHACO Right 07/20/2013   Procedure: CATARACT EXTRACTION PHACO AND INTRAOCULAR LENS PLACEMENT (IOC);  Surgeon: Marylynn Pearson, MD;  Location: [facility];  Service: Ophthalmology;  Laterality: Right;  . COLONOSCOPY W/ BIOPSIES AND POLYPECTOMY    . COLONOSCOPY WITH PROPOFOL N/A 09/07/2014   Procedure: COLONOSCOPY WITH PROPOFOL;  Surgeon: Juanita Craver, MD;  [facility];  Service: Endoscopy;  Laterality: N/A;  . COLONOSCOPY WITH PROPOFOL N/A 01/10/2020   Procedure: COLONOSCOPY WITH PROPOFOL;  Surgeon: Juanita Craver, MD;  [facility];  Service: Endoscopy;  Laterality: N/A;  . CORONARY ANGIOPLASTY WITH STENT PLACEMENT  01/15/2017  . CORONARY ATHERECTOMY N/A 01/15/2017   Procedure: CORONARY ATHERECTOMY;  Surgeon: Nelva Bush, MD;  Location: [facility];  Service: Cardiovascular;  Laterality: N/A;  . CORONARY BALLOON ANGIOPLASTY N/A 01/15/2017   Procedure: CORONARY BALLOON ANGIOPLASTY;  Surgeon: Nelva Bush, MD;  Location: [facility];  Service: Cardiovascular;  Laterality: N/A;  . CORONARY STENT INTERVENTION N/A 01/15/2017   Procedure: CORONARY STENT INTERVENTION;  Surgeon: Nelva Bush, MD;  Location: [facility];  Service: Cardiovascular;  Laterality: N/A;  . ESOPHAGOGASTRODUODENOSCOPY (EGD) WITH PROPOFOL N/A 09/07/2014   Procedure: ESOPHAGOGASTRODUODENOSCOPY (EGD) WITH PROPOFOL;  Surgeon:  Juanita Craver, MD;  [facility];  Service: Endoscopy;  Laterality: N/A;  . EXTERNAL EAR SURGERY Bilateral 1970s   tumors removed  . EYE SURGERY Left 2019   cataract extraction   . EYE SURGERY Right 02/09/2018   eyelid surgery   . INTRAVASCULAR PRESSURE WIRE/FFR STUDY N/A 12/12/2016   Procedure: INTRAVASCULAR PRESSURE WIRE/FFR STUDY;  Surgeon: Nelva Bush, MD;  Location: [facility];  Service: Cardiovascular;  Laterality: N/A;  . MINI SHUNT INSERTION Right 07/20/2013   Procedure: INSERTION OF GLAUCOMA FILTRATION DEVICE RIGHT EYE;  Surgeon:  Marylynn Pearson, MD;  Location: [facility];  Service: Ophthalmology;  Laterality: Right;  . MITOMYCIN C APPLICATION Right 4/33/2951   Procedure: MITOMYCIN C APPLICATION;  Surgeon: Marylynn Pearson, MD;  Location: [facility];  Service: Ophthalmology;  Laterality: Right;  . MITOMYCIN C APPLICATION Right 8/84/1660   Procedure: MITOMYCIN C APPLICATION RIGHT EYE;  Surgeon: Marylynn Pearson, MD;  Location: [facility];  Service: Ophthalmology;  Laterality: Right;  . PLACEMENT OF BREAST IMPLANTS Bilateral 1992   "took all my breast tissue out; put implants in;fibrocystic breast disease "  . POLYPECTOMY  01/10/2020   Procedure: POLYPECTOMY;  Surgeon: Juanita Craver, MD;  [facility];  Service: Endoscopy;;  . RIGHT/LEFT HEART CATH AND CORONARY ANGIOGRAPHY N/A 12/12/2016   Procedure: RIGHT/LEFT HEART CATH AND CORONARY ANGIOGRAPHY;  Surgeon: Nelva Bush, MD;  Location: [facility];  Service: Cardiovascular;  Laterality: N/A;  . TONSILLECTOMY    . TOTAL ABDOMINAL HYSTERECTOMY  1982  . TRABECULECTOMY Right 02/21/2015   Procedure: TRABECULECTOMY WITH [facility] ON THE RIGHT EYE;  Surgeon: Marylynn Pearson, MD;  Location: [facility];  Service: Ophthalmology;  Laterality: Right;    There were no vitals filed for this visit.   Subjective Assessment - 07/06/20 1846    Subjective Pt stated she has some pain anterior shin, pain scale 4/10.  STated increased urination with treatments.    Pertinent History DM, GERD, PVC<HTN    Patient Stated Goals less swelling , to be able to go up down steps better    Currently in Pain? Yes    Pain Score 4     Pain Location Leg    Pain Orientation Distal;Anterior   shin   Pain Descriptors / Indicators Aching    Pain Type Chronic pain                             [facility] Adult PT Treatment/Exercise - 07/06/20 0001      Manual Therapy   Manual Therapy Manual Lymphatic Drainage (MLD);Compression Bandaging    Manual therapy comments completed seperate from all other aspects of  treatment    Manual Lymphatic Drainage (MLD) completed anterior only this session for distal bil LE's including short neck, superficial and deep abdominals with bil inguinal axillary anastomosis    Compression Bandaging Toes to thigh high BLE's using 1/2" foam and multilayer short stretch bandaging.                    PT Short Term Goals - 06/18/20 1611      PT SHORT TERM GOAL #1   Title PT to be I in HEP to increse lymphatic circulation    Time 2    Period Weeks    Status New   Pt is not starting for two weeks   Target Date 07/20/20      PT SHORT TERM GOAL #2  Title Pt to have lost 2 cm fluid in RT LE ; 1 in Lt    Time 2    Period Weeks    Status New    Target Date 07/20/20             PT Long Term Goals - 06/18/20 1613      PT LONG TERM GOAL #1   Title PT Life impact score to decrease by 10 pt s    Time 4    Period Weeks    Status New    Target Date 08/03/20      PT LONG TERM GOAL #2   Title Pt to have lost 3-4 cm in LT LE and 2-3 int RT to allow pain level to be at a 3/10 at the most throughout the day    Time 4    Period Weeks    Status New      PT LONG TERM GOAL #3   Title Pt to be able to sit and stand for 40 minutes at a timewithout increased discomfort.  to improve her functional ability    Time 4    Period Weeks                 Plan - 07/06/20 1848    Clinical Impression Statement Manual lymphedema decognestive techniques complete anterior only as limited by time due to pt brought in some bandages unrolled.  Skin well moistured prior bandaging  to BLE thigh high short stretch bandages wiht 1/2in foam.  Pt mentioned she feels her abdominal region is larger, plan to show self massage next session.  Plan to discuss abdominal compression with evaluating therapist as well.    Examination-Activity Limitations Locomotion Level;Stand;Dressing    Examination-Participation Restrictions Yard Work;Shop    Stability/Clinical Decision Making  Evolving/Moderate complexity    Clinical Decision Making Moderate    Rehab Potential Good    PT Frequency 3x / week    PT Duration 4 weeks    PT Treatment/Interventions Patient/family education;Manual lymph drainage;Compression bandaging    PT Next Visit Plan Continue with total decongestive techniques.Teach self manual techniques.    PT Home Exercise Plan ankle pump, LAQ, hip ab/adduction, marching , diaphram breathing, lymph squeeze.    Consulted and Agree with Plan of Care Patient           Patient will benefit from skilled therapeutic intervention in order to improve the following deficits and impairments:  Pain,Increased edema  Visit Diagnosis: Lymphedema, not elsewhere classified     Problem List Patient Active Problem List   Diagnosis Date Noted  . Back pain 05/24/2020  . 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 heart failure ([place]) 04/16/2020  . 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 Chronic diastolic heart failure ([place]) 03/28/2020  . Uncontrolled type 2 diabetes mellitus with hyperglycemia ([place]) 07/25/2019  . Hiatal hernia 07/25/2019  . Spontaneous ecchymoses 07/25/2019  . Class 1 obesity due to excess calories with serious comorbidity and body mass index (BMI) of 32.0 to 32.9 in adult 07/25/2019  . Peripheral vascular disease, unspecified ([place]) 03/23/2019  . 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 Fibromyalgia 06/01/2017  . DDD (degenerative disc disease), lumbar 06/01/2017  . 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 History of hypercholesterolemia 06/01/2017  . History of anxiety 06/01/2017  . History of IBS 06/01/2017  . Premature atrial contractions 03/30/2017  . PVC's (premature ventricular contractions) 03/30/2017  . Diabetes mellitus without complication ([place]) 42/87/6811  .  Angina pectoris ([place]) 01/15/2017  . CAD (coronary artery disease) 01/12/2017  . Shortness of breath 12/12/2016  .  Acute maxillary sinusitis 05/03/2014  . Obstructive sleep apnea 11/04/2013  . Multiple lung nodules 07/18/2012  . Heart palpitations 03/11/2012  . HTN (hypertension) 03/11/2012  . Gastroesophageal reflux disease without esophagitis 07/13/2011  . Sarcoidosis 02/08/2011  . COPD with acute exacerbation ([place]) 02/05/2011  . Hyperlipemia 09/24/2010  . Atypical chest pain 09/24/2010  . NSVT (nonsustained ventricular tachycardia) ([place]) 09/24/2010  . Leg edema 09/24/2010   Ihor Austin, LPTA/CLT; CBIS [phone]  Aldona Lento 07/06/2020, 6:57 PM  [facility] [address] [place], [place], [address] Phone: [phone]   Fax:  [phone]  Name: Sheila Reynolds MRN: [id number] Date of Birth: [birth year]

## 2020-07-11 ENCOUNTER — Encounter (HOSPITAL_COMMUNITY): Payer: Self-pay | Admitting: Physical Therapy

## 2020-07-11 ENCOUNTER — Ambulatory Visit (HOSPITAL_COMMUNITY): Payer: Medicare Other | Attending: Internal Medicine | Admitting: Physical Therapy

## 2020-07-11 ENCOUNTER — Other Ambulatory Visit: Payer: Self-pay

## 2020-07-11 DIAGNOSIS — I890 Lymphedema, not elsewhere classified: Secondary | ICD-10-CM | POA: Diagnosis not present

## 2020-07-11 NOTE — Therapy (Signed)
[facility] [address] [place], [place], [address] Phone: [phone]   Fax:  [phone]  Physical Therapy Treatment  Patient Details  Name: Sheila Reynolds MRN: [id number] Date of Birth: February 10, 1946 Referring Provider (PT): Glendale Chard   Encounter Date: 07/11/2020   PT End of Session - 07/11/20 1704    Visit Number 5    Number of Visits 12    Date for PT Re-Evaluation 07/30/20    Authorization Type medicare    Progress Note Due on Visit 10    PT Start Time 2229    PT Stop Time 1658    PT Time Calculation (min) 81 min    Activity Tolerance Patient tolerated treatment well    Behavior During Therapy [facility] for tasks assessed/performed           Past Medical History:  Diagnosis Date  . Anemia    3 months ago anemic  . Anxiety    on meds  . Arthritis    "all over" (01/15/2017)  . Asthma   . Bronchitis with emphysema   . Chest pain   . Chronic bronchitis ([place])   . Coronary artery disease    a. 01/2017 she underwent orbital atherectomy/DES to the proxmal LAD and PTCA to ostial D2. 2D Echo 01/15/17 showed mild LVH, EF 60-65%, grade 1 DD.  . Family history of anesthesia complication    daughter N/V  . Fibromyalgia   . GERD (gastroesophageal reflux disease)    on meds  . Glaucoma   . Heart murmur   . History of hiatal hernia   . Hx of echocardiogram    Echo (03/2013):  Tech limited; Mild focal basal septal hypertrophy, EF 60-65%, normal RVF  . Hyperlipidemia   . Hypertension   . Nonsustained ventricular tachycardia ([place])   . OSA on CPAP   . Premature atrial contractions   . PVC (premature ventricular contraction)    a. Holter 12/16: NSR, occ PAC,PVCs  . Sarcoidosis   . Type II diabetes mellitus ([place])     Past Surgical History:  Procedure Laterality Date  . BREAST SURGERY    . CARDIAC CATHETERIZATION  05/04/2007   reveals overall normal left ventricular systolic function. Ejection fraction 65-70%  . CATARACT  EXTRACTION W/PHACO Right 07/20/2013   Procedure: CATARACT EXTRACTION PHACO AND INTRAOCULAR LENS PLACEMENT (IOC);  Surgeon: Marylynn Pearson, MD;  Location: [facility];  Service: Ophthalmology;  Laterality: Right;  . COLONOSCOPY W/ BIOPSIES AND POLYPECTOMY    . COLONOSCOPY WITH PROPOFOL N/A 09/07/2014   Procedure: COLONOSCOPY WITH PROPOFOL;  Surgeon: Juanita Craver, MD;  [facility];  Service: Endoscopy;  Laterality: N/A;  . COLONOSCOPY WITH PROPOFOL N/A 01/10/2020   Procedure: COLONOSCOPY WITH PROPOFOL;  Surgeon: Juanita Craver, MD;  [facility];  Service: Endoscopy;  Laterality: N/A;  . CORONARY ANGIOPLASTY WITH STENT PLACEMENT  01/15/2017  . CORONARY ATHERECTOMY N/A 01/15/2017   Procedure: CORONARY ATHERECTOMY;  Surgeon: Nelva Bush, MD;  Location: [facility];  Service: Cardiovascular;  Laterality: N/A;  . CORONARY BALLOON ANGIOPLASTY N/A 01/15/2017   Procedure: CORONARY BALLOON ANGIOPLASTY;  Surgeon: Nelva Bush, MD;  Location: [facility];  Service: Cardiovascular;  Laterality: N/A;  . CORONARY STENT INTERVENTION N/A 01/15/2017   Procedure: CORONARY STENT INTERVENTION;  Surgeon: Nelva Bush, MD;  Location: [facility];  Service: Cardiovascular;  Laterality: N/A;  . ESOPHAGOGASTRODUODENOSCOPY (EGD) WITH PROPOFOL N/A 09/07/2014   Procedure: ESOPHAGOGASTRODUODENOSCOPY (EGD) WITH PROPOFOL;  Surgeon:  Juanita Craver, MD;  [facility];  Service: Endoscopy;  Laterality: N/A;  . EXTERNAL EAR SURGERY Bilateral 1970s   tumors removed  . EYE SURGERY Left 2019   cataract extraction   . EYE SURGERY Right 02/09/2018   eyelid surgery   . INTRAVASCULAR PRESSURE WIRE/FFR STUDY N/A 12/12/2016   Procedure: INTRAVASCULAR PRESSURE WIRE/FFR STUDY;  Surgeon: Nelva Bush, MD;  Location: [facility];  Service: Cardiovascular;  Laterality: N/A;  . MINI SHUNT INSERTION Right 07/20/2013   Procedure: INSERTION OF GLAUCOMA FILTRATION DEVICE RIGHT EYE;  Surgeon:  Marylynn Pearson, MD;  Location: [facility];  Service: Ophthalmology;  Laterality: Right;  . MITOMYCIN C APPLICATION Right 4/69/6295   Procedure: MITOMYCIN C APPLICATION;  Surgeon: Marylynn Pearson, MD;  Location: [facility];  Service: Ophthalmology;  Laterality: Right;  . MITOMYCIN C APPLICATION Right 2/84/1324   Procedure: MITOMYCIN C APPLICATION RIGHT EYE;  Surgeon: Marylynn Pearson, MD;  Location: [facility];  Service: Ophthalmology;  Laterality: Right;  . PLACEMENT OF BREAST IMPLANTS Bilateral 1992   "took all my breast tissue out; put implants in;fibrocystic breast disease "  . POLYPECTOMY  01/10/2020   Procedure: POLYPECTOMY;  Surgeon: Juanita Craver, MD;  [facility];  Service: Endoscopy;;  . RIGHT/LEFT HEART CATH AND CORONARY ANGIOGRAPHY N/A 12/12/2016   Procedure: RIGHT/LEFT HEART CATH AND CORONARY ANGIOGRAPHY;  Surgeon: Nelva Bush, MD;  Location: [facility];  Service: Cardiovascular;  Laterality: N/A;  . TONSILLECTOMY    . TOTAL ABDOMINAL HYSTERECTOMY  1982  . TRABECULECTOMY Right 02/21/2015   Procedure: TRABECULECTOMY WITH [facility] ON THE RIGHT EYE;  Surgeon: Marylynn Pearson, MD;  Location: [facility];  Service: Ophthalmology;  Laterality: Right;    There were no vitals filed for this visit.   Subjective Assessment - 07/11/20 1703    Subjective Pt states that she has been pumping at home.  States that she has been able to breathe easier since starting therapy    Pertinent History DM, GERD, PVC<HTN    How long can you sit comfortably? with her feet down her legs will go numb but if she has her legs elevated she is ok    How long can you stand comfortably? 15-20 minutes    How long can you walk comfortably? sometimes she uses a cane.    Patient Stated Goals less swelling , to be able to go up down steps better                 LYMPHEDEMA/ONCOLOGY QUESTIONNAIRE - 07/11/20 0001      Right Lower Extremity Lymphedema   20 cm Proximal to Suprapatella 60 cm (P)     10 cm Proximal to  Suprapatella 51 cm (P)     At Midpatella/Popliteal Crease 40.2 cm (P)     30 cm Proximal to Floor at Lateral Plantar Foot 38 cm (P)     20 cm Proximal to Floor at Lateral Plantar Foot 30.7 1 (P)     10 cm Proximal to Floor at Lateral Malleoli 26.3 cm (P)     Circumference of ankle/heel 32.5 cm. (P)     5 cm Proximal to 1st MTP Joint 21.8 cm (P)     Across MTP Joint 20.8 cm (P)       Left Lower Extremity Lymphedema   20 cm Proximal to Suprapatella 59.8 cm (P)     10 cm Proximal to Suprapatella 51.8 cm (P)     At Midpatella/Popliteal Crease 42 cm (P)  30 cm Proximal to Floor at Lateral Plantar Foot 37.8 cm (P)     20 cm Proximal to Floor at Lateral Plantar Foot 32.3 cm (P)     10 cm Proximal to Floor at Lateral Malleoli 28.2 cm (P)     Circumference of ankle/heel 32 cm. (P)     5 cm Proximal to 1st MTP Joint 22.7 cm (P)     Across MTP Joint 20.9 cm (P)                       [facility] Adult PT Treatment/Exercise - 07/11/20 0001      Manual Therapy   Manual Therapy Manual Lymphatic Drainage (MLD);Compression Bandaging    Manual therapy comments completed seperate from all other aspects of treatment    Manual Lymphatic Drainage (MLD) completed anterior only this session for distal bil LE's including short neck, superficial and deep abdominals with bil inguinal axillary anastomosis    Compression Bandaging Toes to thigh high BLE's using 1/2" foam and multilayer short stretch bandaging.    Other Manual Therapy measurement                    PT Short Term Goals - 06/18/20 1611      PT SHORT TERM GOAL #1   Title PT to be I in HEP to increse lymphatic circulation    Time 2    Period Weeks    Status New   Pt is not starting for two weeks   Target Date 07/20/20      PT SHORT TERM GOAL #2   Title Pt to have lost 2 cm fluid in RT LE ; 1 in Lt    Time 2    Period Weeks    Status New    Target Date 07/20/20             PT Long Term Goals - 06/18/20 1613       PT LONG TERM GOAL #1   Title PT Life impact score to decrease by 10 pt s    Time 4    Period Weeks    Status New    Target Date 08/03/20      PT LONG TERM GOAL #2   Title Pt to have lost 3-4 cm in LT LE and 2-3 int RT to allow pain level to be at a 3/10 at the most throughout the day    Time 4    Period Weeks    Status New      PT LONG TERM GOAL #3   Title Pt to be able to sit and stand for 40 minutes at a timewithout increased discomfort.  to improve her functional ability    Time 4    Period Weeks                 Plan - 07/11/20 1706    Clinical Impression Statement Pt measured today noted slight increased edema in Lt LE; Rt LE with mixed measurement.  Therapist explained that if measurements remain stable she may be able to be discharged next week.  Therapist measured pt for capri compression and recommended this or spandex for abdominal compression.  Therapist gave pt capri compression garment.    Examination-Activity Limitations Locomotion Level;Stand;Dressing    Examination-Participation Restrictions Yard Work;Shop    Stability/Clinical Decision Making Evolving/Moderate complexity    Rehab Potential Good    PT Frequency 3x / week    PT  Duration 4 weeks    PT Treatment/Interventions Patient/family education;Manual lymph drainage;Compression bandaging    PT Next Visit Plan Continue with total decongestive techniques.Teach self manual techniques.    PT Home Exercise Plan ankle pump, LAQ, hip ab/adduction, marching , diaphram breathing, lymph squeeze.    Consulted and Agree with Plan of Care Patient           Patient will benefit from skilled therapeutic intervention in order to improve the following deficits and impairments:  Pain,Increased edema  Visit Diagnosis: Lymphedema, not elsewhere classified     Problem List Patient Active Problem List   Diagnosis Date Noted  . Back pain 05/24/2020  . 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  heart failure ([place]) 04/16/2020  . 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) ([place]) 04/13/2020  . Chronic diastolic heart failure ([place]) 03/28/2020  . Uncontrolled type 2 diabetes mellitus with hyperglycemia ([place]) 07/25/2019  . Hiatal hernia 07/25/2019  . Spontaneous ecchymoses 07/25/2019  . Class 1 obesity due to excess calories with serious comorbidity and body mass index (BMI) of 32.0 to 32.9 in adult 07/25/2019  . Peripheral vascular disease, unspecified ([place]) 03/23/2019  . 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 Fibromyalgia 06/01/2017  . DDD (degenerative disc disease), lumbar 06/01/2017  . 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 History of hypercholesterolemia 06/01/2017  . History of anxiety 06/01/2017  . History of IBS 06/01/2017  . Premature atrial contractions 03/30/2017  . PVC's (premature ventricular contractions) 03/30/2017  . Diabetes mellitus without complication ([place]) 89/38/1017  . Angina pectoris ([place]) 01/15/2017  . CAD (coronary artery disease) 01/12/2017  . Shortness of breath 12/12/2016  . Acute maxillary sinusitis 05/03/2014  . Obstructive sleep apnea 11/04/2013  . Multiple lung nodules 07/18/2012  . Heart palpitations 03/11/2012  . HTN (hypertension) 03/11/2012  . Gastroesophageal reflux disease without esophagitis 07/13/2011  . Sarcoidosis 02/08/2011  . COPD with acute exacerbation ([place]) 02/05/2011  . Hyperlipemia 09/24/2010  . Atypical chest pain 09/24/2010  . NSVT (nonsustained ventricular tachycardia) ([place]) 09/24/2010  . Leg edema 09/24/2010    Rayetta Humphrey, PT CLT [phone] 07/11/2020, 5:11 PM  [facility] [address] [place], [place], [address] Phone: [phone]   Fax:  [phone]  Name: DEVLYN PARISH MRN: [id number] Date of Birth: 1945/02/19

## 2020-07-13 ENCOUNTER — Encounter (HOSPITAL_COMMUNITY): Payer: Self-pay

## 2020-07-13 ENCOUNTER — Other Ambulatory Visit: Payer: Self-pay

## 2020-07-13 ENCOUNTER — Ambulatory Visit (HOSPITAL_COMMUNITY): Payer: Medicare Other

## 2020-07-13 DIAGNOSIS — I890 Lymphedema, not elsewhere classified: Secondary | ICD-10-CM | POA: Diagnosis not present

## 2020-07-13 NOTE — Therapy (Signed)
[facility] [address] [place], [place], [address] Phone: [phone]   Fax:  [phone]  Physical Therapy Treatment  Patient Details  Name: Sheila Reynolds MRN: [id number] Date of Birth: [birth year] Referring Provider (PT): Glendale Chard   Encounter Date: 07/13/2020   PT End of Session - 07/13/20 1617    Visit Number 6    Number of Visits 12    Date for PT Re-Evaluation 07/30/20    Authorization Type medicare    Progress Note Due on Visit 10    PT Start Time 1500   late arrival   PT Stop Time 1613    PT Time Calculation (min) 73 min    Activity Tolerance Patient tolerated treatment well    Behavior During Therapy [facility] for tasks assessed/performed           Past Medical History:  Diagnosis Date  . Anemia    3 months ago anemic  . Anxiety    on meds  . Arthritis    "all over" (01/15/2017)  . Asthma   . Bronchitis with emphysema   . Chest pain   . Chronic bronchitis ([place])   . Coronary artery disease    a. 01/2017 she underwent orbital atherectomy/DES to the proxmal LAD and PTCA to ostial D2. 2D Echo 01/15/17 showed mild LVH, EF 60-65%, grade 1 DD.  . Family history of anesthesia complication    daughter N/V  . Fibromyalgia   . GERD (gastroesophageal reflux disease)    on meds  . Glaucoma   . Heart murmur   . History of hiatal hernia   . Hx of echocardiogram    Echo (03/2013):  Tech limited; Mild focal basal septal hypertrophy, EF 60-65%, normal RVF  . Hyperlipidemia   . Hypertension   . Nonsustained ventricular tachycardia ([place])   . OSA on CPAP   . Premature atrial contractions   . PVC (premature ventricular contraction)    a. Holter 12/16: NSR, occ PAC,PVCs  . Sarcoidosis   . Type II diabetes mellitus ([place])     Past Surgical History:  Procedure Laterality Date  . BREAST SURGERY    . CARDIAC CATHETERIZATION  05/04/2007   reveals overall normal left ventricular systolic function. Ejection fraction 65-70%  .  CATARACT EXTRACTION W/PHACO Right 07/20/2013   Procedure: CATARACT EXTRACTION PHACO AND INTRAOCULAR LENS PLACEMENT (IOC);  Surgeon: Marylynn Pearson, MD;  Location: [place];  Service: Ophthalmology;  Laterality: Right;  . COLONOSCOPY W/ BIOPSIES AND POLYPECTOMY    . COLONOSCOPY WITH PROPOFOL N/A 09/07/2014   Procedure: COLONOSCOPY WITH PROPOFOL;  Surgeon: Juanita Craver, MD;  [facility];  Service: Endoscopy;  Laterality: N/A;  . COLONOSCOPY WITH PROPOFOL N/A 01/10/2020   Procedure: COLONOSCOPY WITH PROPOFOL;  Surgeon: Juanita Craver, MD;  [facility];  Service: Endoscopy;  Laterality: N/A;  . CORONARY ANGIOPLASTY WITH STENT PLACEMENT  01/15/2017  . CORONARY ATHERECTOMY N/A 01/15/2017   Procedure: CORONARY ATHERECTOMY;  Surgeon: Nelva Bush, MD;  Location: [place] [facility];  Service: Cardiovascular;  Laterality: N/A;  . CORONARY BALLOON ANGIOPLASTY N/A 01/15/2017   Procedure: CORONARY BALLOON ANGIOPLASTY;  Surgeon: Nelva Bush, MD;  Location: [place] [facility];  Service: Cardiovascular;  Laterality: N/A;  . CORONARY STENT INTERVENTION N/A 01/15/2017   Procedure: CORONARY STENT INTERVENTION;  Surgeon: Nelva Bush, MD;  Location: [place] [facility];  Service: Cardiovascular;  Laterality: N/A;  . ESOPHAGOGASTRODUODENOSCOPY (EGD) WITH PROPOFOL N/A 09/07/2014   Procedure: ESOPHAGOGASTRODUODENOSCOPY (EGD) WITH  PROPOFOL;  Surgeon: Juanita Craver, MD;  [facility];  Service: Endoscopy;  Laterality: N/A;  . EXTERNAL EAR SURGERY Bilateral 1970s   tumors removed  . EYE SURGERY Left 2019   cataract extraction   . EYE SURGERY Right 02/09/2018   eyelid surgery   . INTRAVASCULAR PRESSURE WIRE/FFR STUDY N/A 12/12/2016   Procedure: INTRAVASCULAR PRESSURE WIRE/FFR STUDY;  Surgeon: Nelva Bush, MD;  Location: [place] [facility];  Service: Cardiovascular;  Laterality: N/A;  . MINI SHUNT INSERTION Right 07/20/2013   Procedure: INSERTION OF GLAUCOMA FILTRATION DEVICE RIGHT EYE;   Surgeon: Marylynn Pearson, MD;  Location: [place];  Service: Ophthalmology;  Laterality: Right;  . MITOMYCIN C APPLICATION Right 3/87/5643   Procedure: MITOMYCIN C APPLICATION;  Surgeon: Marylynn Pearson, MD;  Location: [place];  Service: Ophthalmology;  Laterality: Right;  . MITOMYCIN C APPLICATION Right 05/09/5186   Procedure: MITOMYCIN C APPLICATION RIGHT EYE;  Surgeon: Marylynn Pearson, MD;  Location: [place];  Service: Ophthalmology;  Laterality: Right;  . PLACEMENT OF BREAST IMPLANTS Bilateral 1992   "took all my breast tissue out; put implants in;fibrocystic breast disease "  . POLYPECTOMY  01/10/2020   Procedure: POLYPECTOMY;  Surgeon: Juanita Craver, MD;  [facility];  Service: Endoscopy;;  . RIGHT/LEFT HEART CATH AND CORONARY ANGIOGRAPHY N/A 12/12/2016   Procedure: RIGHT/LEFT HEART CATH AND CORONARY ANGIOGRAPHY;  Surgeon: Nelva Bush, MD;  Location: [place] [facility];  Service: Cardiovascular;  Laterality: N/A;  . TONSILLECTOMY    . TOTAL ABDOMINAL HYSTERECTOMY  1982  . TRABECULECTOMY Right 02/21/2015   Procedure: TRABECULECTOMY WITH [facility] ON THE RIGHT EYE;  Surgeon: Marylynn Pearson, MD;  Location: [place];  Service: Ophthalmology;  Laterality: Right;    There were no vitals filed for this visit.   Subjective Assessment - 07/13/20 1615    Subjective Pt late for apt today, pt stated she is a little stress today due to car issues.  Reports she her feet are getting too small for current 8.5 size shoes.    Pertinent History DM, GERD, PVC<HTN    Patient Stated Goals less swelling , to be able to go up down steps better    Currently in Pain? No/denies                 [facility] Adult PT Treatment/Exercise - 07/13/20 0001      Manual Therapy   Manual Therapy Manual Lymphatic Drainage (MLD);Compression Bandaging    Manual therapy comments completed seperate from all other aspects of treatment    Manual Lymphatic Drainage (MLD) completed anterior only this session for distal bil LE's including  short neck, superficial and deep abdominals with bil inguinal axillary anastomosis    Compression Bandaging Toes to thigh high BLE's using 1/2" foam and multilayer short stretch bandaging.                  PT Education - 07/13/20 1631    Education Details Educated self care techniquest to begin at home, handout given    Person(s) Educated Patient    Methods Explanation;Handout;Verbal cues    Comprehension Verbalized understanding;Need further instruction            PT Short Term Goals - 06/18/20 1611      PT SHORT TERM GOAL #1   Title PT to be I in HEP to increse lymphatic circulation    Time 2    Period Weeks    Status New   Pt is not starting for two  weeks   Target Date 07/20/20      PT SHORT TERM GOAL #2   Title Pt to have lost 2 cm fluid in RT LE ; 1 in Lt    Time 2    Period Weeks    Status New    Target Date 07/20/20             PT Long Term Goals - 06/18/20 1613      PT LONG TERM GOAL #1   Title PT Life impact score to decrease by 10 pt s    Time 4    Period Weeks    Status New    Target Date 08/03/20      PT LONG TERM GOAL #2   Title Pt to have lost 3-4 cm in LT LE and 2-3 int RT to allow pain level to be at a 3/10 at the most throughout the day    Time 4    Period Weeks    Status New      PT LONG TERM GOAL #3   Title Pt to be able to sit and stand for 40 minutes at a timewithout increased discomfort.  to improve her functional ability    Time 4    Period Weeks                 Plan - 07/13/20 1617    Clinical Impression Statement Manual decongestive techniques complete anterior only due to limited time wiht late arrival.  Application of multilayer short stretch bandages with 1/2in foam and additional toe wrap due to increased edema in toes.  Pt reports some irritation with liner on inner thigh so changed liner to soft material size G wiht reports of comfort.  Pt instructed and given handout for self care, verbalized understanding.     Examination-Activity Limitations Locomotion Level;Stand;Dressing    Examination-Participation Restrictions Yard Work;Shop    Stability/Clinical Decision Making Evolving/Moderate complexity    Clinical Decision Making Moderate    Rehab Potential Good    PT Frequency 3x / week    PT Duration 4 weeks    PT Treatment/Interventions Patient/family education;Manual lymph drainage;Compression bandaging    PT Next Visit Plan Continue with total decongestive techniques.  Answer any questions, review technique wiht self manual care.    PT Home Exercise Plan ankle pump, LAQ, hip ab/adduction, marching , diaphram breathing, lymph squeeze.    Consulted and Agree with Plan of Care Patient           Patient will benefit from skilled therapeutic intervention in order to improve the following deficits and impairments:  Pain,Increased edema  Visit Diagnosis: Lymphedema, not elsewhere classified     Problem List Patient Active Problem List   Diagnosis Date Noted  . Back pain 05/24/2020  . 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 heart failure ([place]) 04/16/2020  . 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 Chronic diastolic heart failure ([place]) 03/28/2020  . Uncontrolled type 2 diabetes mellitus with hyperglycemia ([place]) 07/25/2019  . Hiatal hernia 07/25/2019  . Spontaneous ecchymoses 07/25/2019  . Class 1 obesity due to excess calories with serious comorbidity and body mass index (BMI) of 32.0 to 32.9 in adult 07/25/2019  . Peripheral vascular disease, unspecified ([place]) 03/23/2019  . 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 Fibromyalgia 06/01/2017  . DDD (degenerative disc disease), lumbar 06/01/2017  . 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 History of hypercholesterolemia 06/01/2017  . History of anxiety 06/01/2017  .  History of IBS 06/01/2017  . Premature atrial contractions 03/30/2017  . PVC's (premature ventricular contractions)  03/30/2017  . Diabetes mellitus without complication ([place]) 99/37/1696  . Angina pectoris ([place]) 01/15/2017  . CAD (coronary artery disease) 01/12/2017  . Shortness of breath 12/12/2016  . Acute maxillary sinusitis 05/03/2014  . Obstructive sleep apnea 11/04/2013  . Multiple lung nodules 07/18/2012  . Heart palpitations 03/11/2012  . HTN (hypertension) 03/11/2012  . Gastroesophageal reflux disease without esophagitis 07/13/2011  . Sarcoidosis 02/08/2011  . COPD with acute exacerbation ([place]) 02/05/2011  . Hyperlipemia 09/24/2010  . Atypical chest pain 09/24/2010  . NSVT (nonsustained ventricular tachycardia) ([place]) 09/24/2010  . Leg edema 09/24/2010   Ihor Austin, LPTA/CLT; CBIS [phone]  Aldona Lento 07/13/2020, 4:33 PM  [facility] [address] [place], [place], [address] Phone: [phone]   Fax:  [phone]  Name: Sheila Reynolds MRN: [id number] Date of Birth: 1945-12-30

## 2020-07-16 ENCOUNTER — Ambulatory Visit (HOSPITAL_COMMUNITY): Payer: Medicare Other | Admitting: Physical Therapy

## 2020-07-16 ENCOUNTER — Other Ambulatory Visit: Payer: Self-pay

## 2020-07-16 DIAGNOSIS — I890 Lymphedema, not elsewhere classified: Secondary | ICD-10-CM | POA: Diagnosis not present

## 2020-07-16 NOTE — Therapy (Signed)
[facility] [address] [place], [place], [address] Phone: [phone]   Fax:  [phone]  Physical Therapy Treatment  Patient Details  Name: Sheila Reynolds MRN: [id number] Date of Birth: 1945-03-19 Referring Provider (PT): Glendale Chard   Encounter Date: 07/16/2020   PT End of Session - 07/16/20 1705    Visit Number 7    Number of Visits 12    Date for PT Re-Evaluation 07/30/20    Authorization Type medicare    Progress Note Due on Visit 10    PT Start Time 1448    PT Stop Time 1612    PT Time Calculation (min) 84 min    Activity Tolerance Patient tolerated treatment well    Behavior During Therapy [facility] for tasks assessed/performed           Past Medical History:  Diagnosis Date  . Anemia    3 months ago anemic  . Anxiety    on meds  . Arthritis    "all over" (01/15/2017)  . Asthma   . Bronchitis with emphysema   . Chest pain   . Chronic bronchitis ([place])   . Coronary artery disease    a. 01/2017 she underwent orbital atherectomy/DES to the proxmal LAD and PTCA to ostial D2. 2D Echo 01/15/17 showed mild LVH, EF 60-65%, grade 1 DD.  . Family history of anesthesia complication    daughter N/V  . Fibromyalgia   . GERD (gastroesophageal reflux disease)    on meds  . Glaucoma   . Heart murmur   . History of hiatal hernia   . Hx of echocardiogram    Echo (03/2013):  Tech limited; Mild focal basal septal hypertrophy, EF 60-65%, normal RVF  . Hyperlipidemia   . Hypertension   . Nonsustained ventricular tachycardia ([place])   . OSA on CPAP   . Premature atrial contractions   . PVC (premature ventricular contraction)    a. Holter 12/16: NSR, occ PAC,PVCs  . Sarcoidosis   . Type II diabetes mellitus ([place])     Past Surgical History:  Procedure Laterality Date  . BREAST SURGERY    . CARDIAC CATHETERIZATION  05/04/2007   reveals overall normal left ventricular systolic function. Ejection fraction 65-70%  . CATARACT  EXTRACTION W/PHACO Right 07/20/2013   Procedure: CATARACT EXTRACTION PHACO AND INTRAOCULAR LENS PLACEMENT (IOC);  Surgeon: Marylynn Pearson, MD;  Location: [place];  Service: Ophthalmology;  Laterality: Right;  . COLONOSCOPY W/ BIOPSIES AND POLYPECTOMY    . COLONOSCOPY WITH PROPOFOL N/A 09/07/2014   Procedure: COLONOSCOPY WITH PROPOFOL;  Surgeon: Juanita Craver, MD;  [facility];  Service: Endoscopy;  Laterality: N/A;  . COLONOSCOPY WITH PROPOFOL N/A 01/10/2020   Procedure: COLONOSCOPY WITH PROPOFOL;  Surgeon: Juanita Craver, MD;  [facility];  Service: Endoscopy;  Laterality: N/A;  . CORONARY ANGIOPLASTY WITH STENT PLACEMENT  01/15/2017  . CORONARY ATHERECTOMY N/A 01/15/2017   Procedure: CORONARY ATHERECTOMY;  Surgeon: Nelva Bush, MD;  Location: [place] [facility];  Service: Cardiovascular;  Laterality: N/A;  . CORONARY BALLOON ANGIOPLASTY N/A 01/15/2017   Procedure: CORONARY BALLOON ANGIOPLASTY;  Surgeon: Nelva Bush, MD;  Location: [place] [facility];  Service: Cardiovascular;  Laterality: N/A;  . CORONARY STENT INTERVENTION N/A 01/15/2017   Procedure: CORONARY STENT INTERVENTION;  Surgeon: Nelva Bush, MD;  Location: [place] [facility];  Service: Cardiovascular;  Laterality: N/A;  . ESOPHAGOGASTRODUODENOSCOPY (EGD) WITH PROPOFOL N/A 09/07/2014   Procedure: ESOPHAGOGASTRODUODENOSCOPY (EGD) WITH PROPOFOL;  Surgeon:  Juanita Craver, MD;  Location: [facility];  Service: Endoscopy;  Laterality: N/A;  . EXTERNAL EAR SURGERY Bilateral 1970s   tumors removed  . EYE SURGERY Left 2019   cataract extraction   . EYE SURGERY Right 02/09/2018   eyelid surgery   . INTRAVASCULAR PRESSURE WIRE/FFR STUDY N/A 12/12/2016   Procedure: INTRAVASCULAR PRESSURE WIRE/FFR STUDY;  Surgeon: Nelva Bush, MD;  Location: [place] [facility];  Service: Cardiovascular;  Laterality: N/A;  . MINI SHUNT INSERTION Right 07/20/2013   Procedure: INSERTION OF GLAUCOMA FILTRATION DEVICE RIGHT EYE;  Surgeon:  Marylynn Pearson, MD;  Location: [facility];  Service: Ophthalmology;  Laterality: Right;  . MITOMYCIN C APPLICATION Right 1/75/1025   Procedure: MITOMYCIN C APPLICATION;  Surgeon: Marylynn Pearson, MD;  Location: [place];  Service: Ophthalmology;  Laterality: Right;  . MITOMYCIN C APPLICATION Right 8/52/7782   Procedure: MITOMYCIN C APPLICATION RIGHT EYE;  Surgeon: Marylynn Pearson, MD;  Location: [place];  Service: Ophthalmology;  Laterality: Right;  . PLACEMENT OF BREAST IMPLANTS Bilateral 1992   "took all my breast tissue out; put implants in;fibrocystic breast disease "  . POLYPECTOMY  01/10/2020   Procedure: POLYPECTOMY;  Surgeon: Juanita Craver, MD;  [facility];  Service: Endoscopy;;  . RIGHT/LEFT HEART CATH AND CORONARY ANGIOGRAPHY N/A 12/12/2016   Procedure: RIGHT/LEFT HEART CATH AND CORONARY ANGIOGRAPHY;  Surgeon: Nelva Bush, MD;  Location: [place] [facility];  Service: Cardiovascular;  Laterality: N/A;  . TONSILLECTOMY    . TOTAL ABDOMINAL HYSTERECTOMY  1982  . TRABECULECTOMY Right 02/21/2015   Procedure: TRABECULECTOMY WITH [facility] ON THE RIGHT EYE;  Surgeon: Marylynn Pearson, MD;  Location: [place];  Service: Ophthalmology;  Laterality: Right;    There were no vitals filed for this visit.   Subjective Assessment - 07/16/20 1647    Subjective pt comes today wearing thigh high compression stockings that she just got in [place].  No issues currently.    Currently in Pain? No/denies                             [facility] Adult PT Treatment/Exercise - 07/16/20 0001      Manual Therapy   Manual Therapy Manual Lymphatic Drainage (MLD);Compression Bandaging    Manual therapy comments completed seperate from all other aspects of treatment    Manual Lymphatic Drainage (MLD) completed anterior only this session for distal bil LE's including short neck, superficial and deep abdominals with bil inguinal axillary anastomosis    Compression Bandaging Toes to thigh high BLE's using 1/2" foam  and multilayer short stretch bandaging.                    PT Short Term Goals - 06/18/20 1611      PT SHORT TERM GOAL #1   Title PT to be I in HEP to increse lymphatic circulation    Time 2    Period Weeks    Status New   Pt is not starting for two weeks   Target Date 07/20/20      PT SHORT TERM GOAL #2   Title Pt to have lost 2 cm fluid in RT LE ; 1 in Lt    Time 2    Period Weeks    Status New    Target Date 07/20/20             PT Long Term Goals - 06/18/20 1613      PT  LONG TERM GOAL #1   Title PT Life impact score to decrease by 10 pt s    Time 4    Period Weeks    Status New    Target Date 08/03/20      PT LONG TERM GOAL #2   Title Pt to have lost 3-4 cm in LT LE and 2-3 int RT to allow pain level to be at a 3/10 at the most throughout the day    Time 4    Period Weeks    Status New      PT LONG TERM GOAL #3   Title Pt to be able to sit and stand for 40 minutes at a timewithout increased discomfort.  to improve her functional ability    Time 4    Period Weeks                 Plan - 07/16/20 1707    Clinical Impression Statement Pt able to don/doff thigh highs independently, however noted stockings are only 8-89mmHg.  Educated that she would need a higher compression.  Pt reported she does have some that are higher but she could not get them on.  Instructed to bring these next visit and we could try them with the sock donner.  continued with MLD and comrpression bandaging to thighs bilaterally.  Pt reported overal comfort following session.    Examination-Activity Limitations Locomotion Level;Stand;Dressing    Examination-Participation Restrictions Yard Work;Shop    Stability/Clinical Decision Making Evolving/Moderate complexity    Rehab Potential Good    PT Frequency 3x / week    PT Duration 4 weeks    PT Treatment/Interventions Patient/family education;Manual lymph drainage;Compression bandaging    PT Next Visit Plan Continue with total  decongestive techniques.  Work on donning garments next visit with sock butler.  give her info on ordering one if successful    PT Home Exercise Plan ankle pump, LAQ, hip ab/adduction, marching , diaphram breathing, lymph squeeze.    Consulted and Agree with Plan of Care Patient           Patient will benefit from skilled therapeutic intervention in order to improve the following deficits and impairments:  Pain,Increased edema  Visit Diagnosis: Lymphedema, not elsewhere classified     Problem List Patient Active Problem List   Diagnosis Date Noted  . Back pain 05/24/2020  . 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 heart failure ([place]) 04/16/2020  . 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) ([place]) 04/13/2020  . Chronic diastolic heart failure ([place]) 03/28/2020  . Uncontrolled type 2 diabetes mellitus with hyperglycemia ([place]) 07/25/2019  . Hiatal hernia 07/25/2019  . Spontaneous ecchymoses 07/25/2019  . Class 1 obesity due to excess calories with serious comorbidity and body mass index (BMI) of 32.0 to 32.9 in adult 07/25/2019  . Peripheral vascular disease, unspecified ([place]) 03/23/2019  . 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 Fibromyalgia 06/01/2017  . DDD (degenerative disc disease), lumbar 06/01/2017  . 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 History of hypercholesterolemia 06/01/2017  . History of anxiety 06/01/2017  . History of IBS 06/01/2017  . Premature atrial contractions 03/30/2017  . PVC's (premature ventricular contractions) 03/30/2017  . Diabetes mellitus without complication ([place]) 95/18/8416  . Angina pectoris ([place]) 01/15/2017  . CAD (coronary artery disease) 01/12/2017  . Shortness of breath 12/12/2016  . Acute maxillary sinusitis 05/03/2014  . Obstructive sleep apnea 11/04/2013  . Multiple lung nodules 07/18/2012  . Heart palpitations 03/11/2012  .  HTN (hypertension) 03/11/2012  . Gastroesophageal reflux disease  without esophagitis 07/13/2011  . Sarcoidosis 02/08/2011  . COPD with acute exacerbation ([place]) 02/05/2011  . Hyperlipemia 09/24/2010  . Atypical chest pain 09/24/2010  . NSVT (nonsustained ventricular tachycardia) ([place]) 09/24/2010  . Leg edema 09/24/2010   Teena Irani, PTA/CLT [phone]  Teena Irani 07/16/2020, 5:12 PM  [facility] [address] [place], [place], [address] Phone: [phone]   Fax:  [phone]  Name: Sheila Reynolds MRN: [id number] Date of Birth: 05-Jan-1946

## 2020-07-18 ENCOUNTER — Ambulatory Visit (HOSPITAL_COMMUNITY): Payer: Medicare Other

## 2020-07-18 ENCOUNTER — Encounter (HOSPITAL_COMMUNITY): Payer: Self-pay

## 2020-07-18 ENCOUNTER — Other Ambulatory Visit: Payer: Self-pay

## 2020-07-18 DIAGNOSIS — I890 Lymphedema, not elsewhere classified: Secondary | ICD-10-CM | POA: Diagnosis not present

## 2020-07-18 NOTE — Therapy (Signed)
[place], [place], [address] Phone: [phone]   Fax:  [phone]  Physical Therapy Treatment  Patient Details  Name: Sheila Reynolds MRN: [id number] Date of Birth: 07/30/1945 Referring Provider (PT): Glendale Chard   Encounter Date: 07/18/2020   PT End of Session - 07/18/20 1559    Visit Number 8    Number of Visits 12    Date for PT Re-Evaluation 07/30/20    Authorization Type medicare    Progress Note Due on Visit 10    PT Start Time 9563    PT Stop Time 1535    PT Time Calculation (min) 90 min    Activity Tolerance Patient tolerated treatment well    Behavior During Therapy [facility] for tasks assessed/performed           Past Medical History:  Diagnosis Date  . Anemia    3 months ago anemic  . Anxiety    on meds  . Arthritis    "all over" (01/15/2017)  . Asthma   . Bronchitis with emphysema   . Chest pain   . Chronic bronchitis ([place])   . Coronary artery disease    a. 01/2017 she underwent orbital atherectomy/DES to the proxmal LAD and PTCA to ostial D2. 2D Echo 01/15/17 showed mild LVH, EF 60-65%, grade 1 DD.  . Family history of anesthesia complication    daughter N/V  . Fibromyalgia   . GERD (gastroesophageal reflux disease)    on meds  . Glaucoma   . Heart murmur   . History of hiatal hernia   . Hx of echocardiogram    Echo (03/2013):  Tech limited; Mild focal basal septal hypertrophy, EF 60-65%, normal RVF  . Hyperlipidemia   . Hypertension   . Nonsustained ventricular tachycardia ([place])   . OSA on CPAP   . Premature atrial contractions   . PVC (premature ventricular contraction)    a. Holter 12/16: NSR, occ PAC,PVCs  . Sarcoidosis   . Type II diabetes mellitus ([place])     Past Surgical History:  Procedure Laterality Date  . BREAST SURGERY    . CARDIAC CATHETERIZATION  05/04/2007   reveals overall normal left ventricular systolic function. Ejection fraction 65-70%  . CATARACT  EXTRACTION W/PHACO Right 07/20/2013   Procedure: CATARACT EXTRACTION PHACO AND INTRAOCULAR LENS PLACEMENT (IOC);  Surgeon: Marylynn Pearson, MD;  Location: [facility];  Service: Ophthalmology;  Laterality: Right;  . COLONOSCOPY W/ BIOPSIES AND POLYPECTOMY    . COLONOSCOPY WITH PROPOFOL N/A 09/07/2014   Procedure: COLONOSCOPY WITH PROPOFOL;  Surgeon: Juanita Craver, MD;  [facility];  Service: Endoscopy;  Laterality: N/A;  . COLONOSCOPY WITH PROPOFOL N/A 01/10/2020   Procedure: COLONOSCOPY WITH PROPOFOL;  Surgeon: Juanita Craver, MD;  [facility];  Service: Endoscopy;  Laterality: N/A;  . CORONARY ANGIOPLASTY WITH STENT PLACEMENT  01/15/2017  . CORONARY ATHERECTOMY N/A 01/15/2017   Procedure: CORONARY ATHERECTOMY;  Surgeon: Nelva Bush, MD;  Location: [place] [facility];  Service: Cardiovascular;  Laterality: N/A;  . CORONARY BALLOON ANGIOPLASTY N/A 01/15/2017   Procedure: CORONARY BALLOON ANGIOPLASTY;  Surgeon: Nelva Bush, MD;  Location: [place] [facility];  Service: Cardiovascular;  Laterality: N/A;  . CORONARY STENT INTERVENTION N/A 01/15/2017   Procedure: CORONARY STENT INTERVENTION;  Surgeon: Nelva Bush, MD;  Location: [place] [facility];  Service: Cardiovascular;  Laterality: N/A;  . ESOPHAGOGASTRODUODENOSCOPY (EGD) WITH PROPOFOL N/A 09/07/2014   Procedure: ESOPHAGOGASTRODUODENOSCOPY (EGD) WITH PROPOFOL;  Surgeon:  Juanita Craver, MD;  Location: [facility];  Service: Endoscopy;  Laterality: N/A;  . EXTERNAL EAR SURGERY Bilateral 1970s   tumors removed  . EYE SURGERY Left 2019   cataract extraction   . EYE SURGERY Right 02/09/2018   eyelid surgery   . INTRAVASCULAR PRESSURE WIRE/FFR STUDY N/A 12/12/2016   Procedure: INTRAVASCULAR PRESSURE WIRE/FFR STUDY;  Surgeon: Nelva Bush, MD;  Location: [place] [facility];  Service: Cardiovascular;  Laterality: N/A;  . MINI SHUNT INSERTION Right 07/20/2013   Procedure: INSERTION OF GLAUCOMA FILTRATION DEVICE RIGHT EYE;  Surgeon:  Marylynn Pearson, MD;  Location: [facility];  Service: Ophthalmology;  Laterality: Right;  . MITOMYCIN C APPLICATION Right 3/41/9622   Procedure: MITOMYCIN C APPLICATION;  Surgeon: Marylynn Pearson, MD;  Location: [facility];  Service: Ophthalmology;  Laterality: Right;  . MITOMYCIN C APPLICATION Right 2/97/9892   Procedure: MITOMYCIN C APPLICATION RIGHT EYE;  Surgeon: Marylynn Pearson, MD;  Location: [facility];  Service: Ophthalmology;  Laterality: Right;  . PLACEMENT OF BREAST IMPLANTS Bilateral 1992   "took all my breast tissue out; put implants in;fibrocystic breast disease "  . POLYPECTOMY  01/10/2020   Procedure: POLYPECTOMY;  Surgeon: Juanita Craver, MD;  [facility];  Service: Endoscopy;;  . RIGHT/LEFT HEART CATH AND CORONARY ANGIOGRAPHY N/A 12/12/2016   Procedure: RIGHT/LEFT HEART CATH AND CORONARY ANGIOGRAPHY;  Surgeon: Nelva Bush, MD;  Location: [place] [facility];  Service: Cardiovascular;  Laterality: N/A;  . TONSILLECTOMY    . TOTAL ABDOMINAL HYSTERECTOMY  1982  . TRABECULECTOMY Right 02/21/2015   Procedure: TRABECULECTOMY WITH [facility] ON THE RIGHT EYE;  Surgeon: Marylynn Pearson, MD;  Location: [place];  Service: Ophthalmology;  Laterality: Right;    There were no vitals filed for this visit.   Subjective Assessment - 07/18/20 1547    Subjective Feelin good today, no reoprts of pain.  Stated she does not have any compression stockings that are thicker but are easy to put on.  Reports she had a fall Monday and had to wait for 30 minutes for husband to help her back up, no pain following.    Pertinent History DM, GERD, PVC<HTN    Patient Stated Goals less swelling , to be able to go up down steps better    Currently in Pain? No/denies                 LYMPHEDEMA/ONCOLOGY QUESTIONNAIRE - 07/18/20 0001      Right Lower Extremity Lymphedema   20 cm Proximal to Suprapatella 60.4 cm   on 5/9 was: 51.8   10 cm Proximal to Suprapatella 50.5 cm   on 5/9 was: 53.8   At Midpatella/Popliteal Crease  41 cm   on 5/9 was:40.9   30 cm Proximal to Floor at Lateral Plantar Foot 37.5 cm   on 5/9 was:37.8   20 cm Proximal to Floor at Lateral Plantar Foot 32 1   on 5/9 was: 31.1   10 cm Proximal to Floor at Lateral Malleoli 25.4 cm   on 5/9 was: 27   Circumference of ankle/heel 31.6 cm.   on 5/9 was: 20.9   5 cm Proximal to 1st MTP Joint 22.2 cm   on 5/9 was: 22.3   Across MTP Joint 20.9 cm   on 5/9 was: 21.3     Left Lower Extremity Lymphedema   20 cm Proximal to Suprapatella 59 cm   on 5/9 was: 60.4   10 cm Proximal to Suprapatella 50.5 cm   on  5/9 was: 50.4   At Midpatella/Popliteal Crease 40.5 cm   on 5/9 was:41   30 cm Proximal to Floor at Lateral Plantar Foot 37.5 cm   on 5/9 was: 38.2   20 cm Proximal to Floor at Lateral Plantar Foot 31 cm   on 5/9 was: 33.7   10 cm Proximal to Floor at Lateral Malleoli 26.5 cm   on 5/9 was: 30   Circumference of ankle/heel 32.4 cm.   on 5/9 was: 33.3   5 cm Proximal to 1st MTP Joint 22.7 cm   on 5/9 was: 22.8   Across MTP Joint 20.5 cm   on 5/9 was: 21.4                     [facility] Adult PT Treatment/Exercise - 07/18/20 0001      Manual Therapy   Manual Therapy Manual Lymphatic Drainage (MLD);Compression Bandaging    Manual therapy comments completed seperate from all other aspects of treatment    Manual Lymphatic Drainage (MLD) completed anterior only this session for distal bil LE's including short neck, superficial and deep abdominals with bil inguinal axillary anastomosis    Compression Bandaging Toes to thigh high BLE's using 1/2" foam and multilayer short stretch bandaging.    Other Manual Therapy measurements, [employer] Therapy measurements complete for thigh high, printed out/given contact number for [employer] for capris                  PT Education - 07/18/20 1605    Education Details Measurements taken and paperwork given for [employer] [place]. 20-30 mmHg thigh high compression garment    Person(s) Educated Patient     Methods Explanation;Handout    Comprehension Verbalized understanding            PT Short Term Goals - 06/18/20 1611      PT SHORT TERM GOAL #1   Title PT to be I in HEP to increse lymphatic circulation    Time 2    Period Weeks    Status New   Pt is not starting for two weeks   Target Date 07/20/20      PT SHORT TERM GOAL #2   Title Pt to have lost 2 cm fluid in RT LE ; 1 in Lt    Time 2    Period Weeks    Status New    Target Date 07/20/20             PT Long Term Goals - 06/18/20 1613      PT LONG TERM GOAL #1   Title PT Life impact score to decrease by 10 pt s    Time 4    Period Weeks    Status New    Target Date 08/03/20      PT LONG TERM GOAL #2   Title Pt to have lost 3-4 cm in LT LE and 2-3 int RT to allow pain level to be at a 3/10 at the most throughout the day    Time 4    Period Weeks    Status New      PT LONG TERM GOAL #3   Title Pt to be able to sit and stand for 40 minutes at a timewithout increased discomfort.  to improve her functional ability    Time 4    Period Weeks                 Plan - 07/18/20  1600    Clinical Impression Statement Pt states she does not have thicker compression stockings, measurements taken and paperwork given for thigh highs at [place].  Also given contact information for [employer] for capris.  Measurements taken today with some increased and some reduction in volume, close to plato.  Encouraged pt to place order for compression garments, wrote down butler as well to assist with donning compression stockings.    Examination-Activity Limitations Locomotion Level;Stand;Dressing    Examination-Participation Restrictions Yard Work;Shop    Stability/Clinical Decision Making Evolving/Moderate complexity    Clinical Decision Making Moderate    Rehab Potential Good    PT Frequency 3x / week    PT Duration 4 weeks    PT Treatment/Interventions Patient/family education;Manual lymph drainage;Compression  bandaging    PT Next Visit Plan Continue with total decongestive techniques.  Work on donning garments next visit with sock butler.  give her info on ordering one if successful    PT Home Exercise Plan ankle pump, LAQ, hip ab/adduction, marching , diaphram breathing, lymph squeeze.    Consulted and Agree with Plan of Care Patient           Patient will benefit from skilled therapeutic intervention in order to improve the following deficits and impairments:  Pain,Increased edema  Visit Diagnosis: Lymphedema, not elsewhere classified     Problem List Patient Active Problem List   Diagnosis Date Noted  . Back pain 05/24/2020  . 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 heart failure ([place]) 04/16/2020  . 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 Chronic diastolic heart failure ([place]) 03/28/2020  . Uncontrolled type 2 diabetes mellitus with hyperglycemia ([place]) 07/25/2019  . Hiatal hernia 07/25/2019  . Spontaneous ecchymoses 07/25/2019  . Class 1 obesity due to excess calories with serious comorbidity and body mass index (BMI) of 32.0 to 32.9 in adult 07/25/2019  . Peripheral vascular disease, unspecified ([place]) 03/23/2019  . 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 Fibromyalgia 06/01/2017  . DDD (degenerative disc disease), lumbar 06/01/2017  . 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 History of hypercholesterolemia 06/01/2017  . History of anxiety 06/01/2017  . History of IBS 06/01/2017  . Premature atrial contractions 03/30/2017  . PVC's (premature ventricular contractions) 03/30/2017  . Diabetes mellitus without complication ([place]) 29/19/1660  . Angina pectoris ([place]) 01/15/2017  . CAD (coronary artery disease) 01/12/2017  . Shortness of breath 12/12/2016  . Acute maxillary sinusitis 05/03/2014  . Obstructive sleep apnea 11/04/2013  . Multiple lung nodules 07/18/2012  . Heart palpitations 03/11/2012  . HTN  (hypertension) 03/11/2012  . Gastroesophageal reflux disease without esophagitis 07/13/2011  . Sarcoidosis 02/08/2011  . COPD with acute exacerbation ([place]) 02/05/2011  . Hyperlipemia 09/24/2010  . Atypical chest pain 09/24/2010  . NSVT (nonsustained ventricular tachycardia) ([place]) 09/24/2010  . Leg edema 09/24/2010   Ihor Austin, LPTA/CLT; CBIS [phone]  Aldona Lento 07/18/2020, 4:07 PM  [facility] [place] [address] [place], [place], [address] Phone: [phone]   Fax:  [phone]  Name: ERA PARR MRN: [id number] Date of Birth: Dec 27, 1945

## 2020-07-19 ENCOUNTER — Ambulatory Visit (HOSPITAL_COMMUNITY): Payer: Medicare Other | Admitting: Physical Therapy

## 2020-07-19 DIAGNOSIS — I890 Lymphedema, not elsewhere classified: Secondary | ICD-10-CM

## 2020-07-19 NOTE — Therapy (Signed)
[facility] [address] [place], [place], [address] Phone: [phone]   Fax:  [phone]  Physical Therapy Treatment  Patient Details  Name: Sheila Reynolds MRN: [id number] Date of Birth: Jan 13, 1946 Referring Provider (PT): Glendale Chard   Encounter Date: 07/19/2020   PT End of Session - 07/19/20 1652     Number of Visits 12    Date for PT Re-Evaluation 07/30/20    Authorization Type medicare    Progress Note Due on Visit 10    PT Start Time 7425    PT Stop Time 1650    PT Time Calculation (min) 75 min    Activity Tolerance Patient tolerated treatment well    Behavior During Therapy [facility] for tasks assessed/performed             Past Medical History:  Diagnosis Date   Anemia    3 months ago anemic   Anxiety    on meds   Arthritis    "all over" (01/15/2017)   Asthma    Bronchitis with emphysema    Chest pain    Chronic bronchitis ([place])    Coronary artery disease    a. 01/2017 she underwent orbital atherectomy/DES to the proxmal LAD and PTCA to ostial D2. 2D Echo 01/15/17 showed mild LVH, EF 60-65%, grade 1 DD.   Family history of anesthesia complication    daughter N/V   Fibromyalgia    GERD (gastroesophageal reflux disease)    on meds   Glaucoma    Heart murmur    History of hiatal hernia    Hx of echocardiogram    Echo (03/2013):  Tech limited; Mild focal basal septal hypertrophy, EF 60-65%, normal RVF   Hyperlipidemia    Hypertension    Nonsustained ventricular tachycardia ([facility])    OSA on CPAP    Premature atrial contractions    PVC (premature ventricular contraction)    a. Holter 12/16: NSR, occ PAC,PVCs   Sarcoidosis    Type II diabetes mellitus ([place])     Past Surgical History:  Procedure Laterality Date   BREAST SURGERY     CARDIAC CATHETERIZATION  05/04/2007   reveals overall normal left ventricular systolic function. Ejection fraction 65-70%   CATARACT EXTRACTION W/PHACO Right 07/20/2013    Procedure: CATARACT EXTRACTION PHACO AND INTRAOCULAR LENS PLACEMENT (IOC);  Surgeon: Marylynn Pearson, MD;  Location: [facility];  Service: Ophthalmology;  Laterality: Right;   COLONOSCOPY W/ BIOPSIES AND POLYPECTOMY     COLONOSCOPY WITH PROPOFOL N/A 09/07/2014   Procedure: COLONOSCOPY WITH PROPOFOL;  Surgeon: Juanita Craver, MD;  [facility];  Service: Endoscopy;  Laterality: N/A;   COLONOSCOPY WITH PROPOFOL N/A 01/10/2020   Procedure: COLONOSCOPY WITH PROPOFOL;  Surgeon: Juanita Craver, MD;  [facility];  Service: Endoscopy;  Laterality: N/A;   CORONARY ANGIOPLASTY WITH STENT PLACEMENT  01/15/2017   CORONARY ATHERECTOMY N/A 01/15/2017   Procedure: CORONARY ATHERECTOMY;  Surgeon: Nelva Bush, MD;  Location: [place] [facility];  Service: Cardiovascular;  Laterality: N/A;   CORONARY BALLOON ANGIOPLASTY N/A 01/15/2017   Procedure: CORONARY BALLOON ANGIOPLASTY;  Surgeon: Nelva Bush, MD;  Location: [place] [facility];  Service: Cardiovascular;  Laterality: N/A;   CORONARY STENT INTERVENTION N/A 01/15/2017   Procedure: CORONARY STENT INTERVENTION;  Surgeon: Nelva Bush, MD;  Location: [place] [facility];  Service: Cardiovascular;  Laterality: N/A;   ESOPHAGOGASTRODUODENOSCOPY (EGD) WITH PROPOFOL N/A 09/07/2014   Procedure: ESOPHAGOGASTRODUODENOSCOPY (EGD) WITH PROPOFOL;  Surgeon: Juanita Craver, MD;  [facility];  Service: Endoscopy;  Laterality: N/A;   EXTERNAL EAR SURGERY Bilateral 1970s   tumors removed   EYE SURGERY Left 2019   cataract extraction    EYE SURGERY Right 02/09/2018   eyelid surgery    INTRAVASCULAR PRESSURE WIRE/FFR STUDY N/A 12/12/2016   Procedure: INTRAVASCULAR PRESSURE WIRE/FFR STUDY;  Surgeon: Nelva Bush, MD;  Location: [place] [facility];  Service: Cardiovascular;  Laterality: N/A;   MINI SHUNT INSERTION Right 07/20/2013   Procedure: INSERTION OF GLAUCOMA FILTRATION DEVICE RIGHT EYE;  Surgeon: Marylynn Pearson, MD;  Location: [facility];  Service:  Ophthalmology;  Laterality: Right;   MITOMYCIN C APPLICATION Right 0/24/0973   Procedure: MITOMYCIN C APPLICATION;  Surgeon: Marylynn Pearson, MD;  Location: [facility];  Service: Ophthalmology;  Laterality: Right;   MITOMYCIN C APPLICATION Right 5/32/9924   Procedure: MITOMYCIN C APPLICATION RIGHT EYE;  Surgeon: Marylynn Pearson, MD;  Location: [facility];  Service: Ophthalmology;  Laterality: Right;   PLACEMENT OF BREAST IMPLANTS Bilateral 1992   "took all my breast tissue out; put implants in;fibrocystic breast disease "   POLYPECTOMY  01/10/2020   Procedure: POLYPECTOMY;  Surgeon: Juanita Craver, MD;  [facility];  Service: Endoscopy;;   RIGHT/LEFT HEART CATH AND CORONARY ANGIOGRAPHY N/A 12/12/2016   Procedure: RIGHT/LEFT HEART CATH AND CORONARY ANGIOGRAPHY;  Surgeon: Nelva Bush, MD;  Location: [place] [facility];  Service: Cardiovascular;  Laterality: N/A;   TONSILLECTOMY     TOTAL ABDOMINAL HYSTERECTOMY  1982   TRABECULECTOMY Right 02/21/2015   Procedure: TRABECULECTOMY WITH [facility] ON THE RIGHT EYE;  Surgeon: Marylynn Pearson, MD;  Location: [place];  Service: Ophthalmology;  Laterality: Right;    There were no vitals filed for this visit.   Subjective Assessment - 07/19/20 1650     Subjective Pt states that she still is unable to get her Rt foot into her shoes.    Pertinent History DM, GERD, PVC<HTN    Patient Stated Goals less swelling , to be able to go up down steps better                               [facility] Adult PT Treatment/Exercise - 07/19/20 0001       Manual Therapy   Manual Therapy Manual Lymphatic Drainage (MLD);Compression Bandaging    Manual therapy comments completed seperate from all other aspects of treatment    Manual Lymphatic Drainage (MLD) completed anterior and posteriorly this session  including short neck, superficial and deep abdominals with bil inguinal axillary anastomosis and LE    Compression Bandaging Toes to thigh high BLE's using 1/2" foam  and multilayer short stretch bandaging.                      PT Short Term Goals - 06/18/20 1611       PT SHORT TERM GOAL #1   Title PT to be I in HEP to increse lymphatic circulation    Time 2    Period Weeks    Status New   Pt is not starting for two weeks   Target Date 07/20/20      PT SHORT TERM GOAL #2   Title Pt to have lost 2 cm fluid in RT LE ; 1 in Lt    Time 2    Period Weeks    Status New    Target Date 07/20/20  PT Long Term Goals - 06/18/20 1613       PT LONG TERM GOAL #1   Title PT Life impact score to decrease by 10 pt s    Time 4    Period Weeks    Status New    Target Date 08/03/20      PT LONG TERM GOAL #2   Title Pt to have lost 3-4 cm in LT LE and 2-3 int RT to allow pain level to be at a 3/10 at the most throughout the day    Time 4    Period Weeks    Status New      PT LONG TERM GOAL #3   Title Pt to be able to sit and stand for 40 minutes at a timewithout increased discomfort.  to improve her functional ability    Time 4    Period Weeks                   Plan - 07/19/20 1653     Clinical Impression Statement Added orange foam to Rt dorsal foot to see if this will assist in helping stubborn edema in foot.  Pt is going to go down to [facility] to be fittted for compression.    Examination-Activity Limitations Locomotion Level;Stand;Dressing    Examination-Participation Restrictions Yard Work;Shop    Stability/Clinical Decision Making Evolving/Moderate complexity    Rehab Potential Good    PT Frequency 3x / week    PT Duration 4 weeks    PT Treatment/Interventions Patient/family education;Manual lymph drainage;Compression bandaging    PT Next Visit Plan Anticipate discharge next week.    Work on donning garments when pt obtains surgical thigh high garment with sock butler.    PT Home Exercise Plan ankle pump, LAQ, hip ab/adduction, marching , diaphram breathing, lymph squeeze.    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) ([place]) 04/13/2020   Chronic diastolic heart failure ([place]) 03/28/2020   Uncontrolled type 2 diabetes mellitus with hyperglycemia ([place]) 07/25/2019   Hiatal hernia 07/25/2019   Spontaneous ecchymoses 07/25/2019   Class 1 obesity due to excess calories with serious comorbidity and body mass index (BMI) of 32.0 to 32.9 in adult 07/25/2019   Peripheral vascular disease, unspecified ([place]) 03/23/2019   Uveitis 06/01/2017   Fibromyalgia 06/01/2017   DDD (degenerative disc disease), lumbar 06/01/2017   DDD (degenerative disc disease), thoracic 06/01/2017   History of hypercholesterolemia 06/01/2017   History of anxiety 06/01/2017   History of IBS 06/01/2017   Premature atrial contractions 03/30/2017   PVC's (premature ventricular contractions) 03/30/2017   Diabetes mellitus without complication ([place]) 62/56/3893   Angina pectoris ([place]) 01/15/2017   CAD (coronary artery disease) 01/12/2017   Shortness of breath 12/12/2016   Acute maxillary sinusitis 05/03/2014   Obstructive sleep apnea 11/04/2013   Multiple lung nodules 07/18/2012   Heart palpitations 03/11/2012   HTN (hypertension) 03/11/2012   Gastroesophageal reflux disease without esophagitis 07/13/2011   Sarcoidosis 02/08/2011   COPD with acute exacerbation (  [place]) 02/05/2011   Hyperlipemia 09/24/2010   Atypical chest pain 09/24/2010   NSVT (nonsustained ventricular tachycardia) ([employer]) 09/24/2010   Leg edema 09/24/2010     Leni Pankonin,CINDY 07/19/2020, 4:56 PM  [place], [place], [address] Phone: [phone]   Fax:  [phone]  Name: Sheila Reynolds MRN: [id number] Date of Birth: [birth year]

## 2020-07-20 ENCOUNTER — Encounter (HOSPITAL_COMMUNITY): Payer: Medicare Other | Admitting: Physical Therapy

## 2020-07-22 ENCOUNTER — Other Ambulatory Visit: Payer: Self-pay | Admitting: Cardiovascular Disease

## 2020-07-24 ENCOUNTER — Ambulatory Visit: Payer: Medicare Other | Admitting: Neurology

## 2020-07-24 ENCOUNTER — Other Ambulatory Visit: Payer: Self-pay

## 2020-07-24 ENCOUNTER — Ambulatory Visit (HOSPITAL_COMMUNITY): Payer: Medicare Other | Admitting: Physical Therapy

## 2020-07-24 DIAGNOSIS — I890 Lymphedema, not elsewhere classified: Secondary | ICD-10-CM

## 2020-07-24 NOTE — Therapy (Signed)
[facility] [address] [place], [place], [address] Phone: [phone]   Fax:  [phone]  Physical Therapy Treatment  Patient Details  Name: Sheila Reynolds MRN: [id number] Date of Birth: 03-24-1945 Referring Provider (PT): Glendale Chard  PHYSICAL THERAPY DISCHARGE SUMMARY  Visits from Start of Care: 10  Current functional level related to goals / functional outcomes: See below    Remaining deficits: Slight edema   Education / Equipment: HEP, the importance of using her pump and garments daily    Patient agrees to discharge. Patient goals were met. Patient is being discharged due to meeting the stated rehab goals. 10  Encounter Date: 07/24/2020   PT End of Session - 07/24/20 1608     Visit Number 10    Number of Visits 10    Authorization Type medicare    PT Start Time 1245    PT Stop Time 1605    PT Time Calculation (min) 35 min    Activity Tolerance Patient tolerated treatment well    Behavior During Therapy WFL for tasks assessed/performed             Past Medical History:  Diagnosis Date   Anemia    3 months ago anemic   Anxiety    on meds   Arthritis    "all over" (01/15/2017)   Asthma    Bronchitis with emphysema    Chest pain    Chronic bronchitis ([place])    Coronary artery disease    a. 01/2017 she underwent orbital atherectomy/DES to the proxmal LAD and PTCA to ostial D2. 2D Echo 01/15/17 showed mild LVH, EF 60-65%, grade 1 DD.   Family history of anesthesia complication    daughter N/V   Fibromyalgia    GERD (gastroesophageal reflux disease)    on meds   Glaucoma    Heart murmur    History of hiatal hernia    Hx of echocardiogram    Echo (03/2013):  Tech limited; Mild focal basal septal hypertrophy, EF 60-65%, normal RVF   Hyperlipidemia    Hypertension    Nonsustained ventricular tachycardia (HCC)    OSA on CPAP    Premature atrial contractions    PVC (premature ventricular contraction)     a. Holter 12/16: NSR, occ PAC,PVCs   Sarcoidosis    Type II diabetes mellitus ([place])     Past Surgical History:  Procedure Laterality Date   BREAST SURGERY     CARDIAC CATHETERIZATION  05/04/2007   reveals overall normal left ventricular systolic function. Ejection fraction 65-70%   CATARACT EXTRACTION W/PHACO Right 07/20/2013   Procedure: CATARACT EXTRACTION PHACO AND INTRAOCULAR LENS PLACEMENT (IOC);  Surgeon: Marylynn Pearson, MD;  Location: [facility];  Service: Ophthalmology;  Laterality: Right;   COLONOSCOPY W/ BIOPSIES AND POLYPECTOMY     COLONOSCOPY WITH PROPOFOL N/A 09/07/2014   Procedure: COLONOSCOPY WITH PROPOFOL;  Surgeon: Juanita Craver, MD;  [facility]: [facility];  Service: Endoscopy;  Laterality: N/A;   COLONOSCOPY WITH PROPOFOL N/A 01/10/2020   Procedure: COLONOSCOPY WITH PROPOFOL;  Surgeon: Juanita Craver, MD;  [facility];  Service: Endoscopy;  Laterality: N/A;   CORONARY ANGIOPLASTY WITH STENT PLACEMENT  01/15/2017   CORONARY ATHERECTOMY N/A 01/15/2017   Procedure: CORONARY ATHERECTOMY;  Surgeon: Nelva Bush, MD;  Location: [facility];  Service: Cardiovascular;  Laterality: N/A;   CORONARY BALLOON ANGIOPLASTY N/A 01/15/2017   Procedure: CORONARY BALLOON ANGIOPLASTY;  Surgeon: Nelva Bush, MD;  Location: [facility];  Service: Cardiovascular;  Laterality: N/A;   CORONARY STENT INTERVENTION N/A 01/15/2017   Procedure: CORONARY STENT INTERVENTION;  Surgeon: Nelva Bush, MD;  Location: [facility];  Service: Cardiovascular;  Laterality: N/A;   ESOPHAGOGASTRODUODENOSCOPY (EGD) WITH PROPOFOL N/A 09/07/2014   Procedure: ESOPHAGOGASTRODUODENOSCOPY (EGD) WITH PROPOFOL;  Surgeon: Juanita Craver, MD;  [facility];  Service: Endoscopy;  Laterality: N/A;   EXTERNAL EAR SURGERY Bilateral 1970s   tumors removed   EYE SURGERY Left 2019   cataract extraction    EYE SURGERY Right 02/09/2018   eyelid surgery    INTRAVASCULAR PRESSURE WIRE/FFR  STUDY N/A 12/12/2016   Procedure: INTRAVASCULAR PRESSURE WIRE/FFR STUDY;  Surgeon: Nelva Bush, MD;  Location: [facility];  Service: Cardiovascular;  Laterality: N/A;   MINI SHUNT INSERTION Right 07/20/2013   Procedure: INSERTION OF GLAUCOMA FILTRATION DEVICE RIGHT EYE;  Surgeon: Marylynn Pearson, MD;  Location: [facility];  Service: Ophthalmology;  Laterality: Right;   MITOMYCIN C APPLICATION Right 7/41/2878   Procedure: MITOMYCIN C APPLICATION;  Surgeon: Marylynn Pearson, MD;  Location: [facility];  Service: Ophthalmology;  Laterality: Right;   MITOMYCIN C APPLICATION Right 6/76/7209   Procedure: MITOMYCIN C APPLICATION RIGHT EYE;  Surgeon: Marylynn Pearson, MD;  Location: [facility];  Service: Ophthalmology;  Laterality: Right;   PLACEMENT OF BREAST IMPLANTS Bilateral 1992   "took all my breast tissue out; put implants in;fibrocystic breast disease "   POLYPECTOMY  01/10/2020   Procedure: POLYPECTOMY;  Surgeon: Juanita Craver, MD;  [facility];  Service: Endoscopy;;   RIGHT/LEFT HEART CATH AND CORONARY ANGIOGRAPHY N/A 12/12/2016   Procedure: RIGHT/LEFT HEART CATH AND CORONARY ANGIOGRAPHY;  Surgeon: Nelva Bush, MD;  Location: [facility];  Service: Cardiovascular;  Laterality: N/A;   TONSILLECTOMY     TOTAL ABDOMINAL HYSTERECTOMY  1982   TRABECULECTOMY Right 02/21/2015   Procedure: TRABECULECTOMY WITH [facility] ON THE RIGHT EYE;  Surgeon: Marylynn Pearson, MD;  Location: [facility];  Service: Ophthalmology;  Laterality: Right;    There were no vitals filed for this visit.   Subjective Assessment - 07/24/20 1538     Subjective Pt states that she just got her compression garments.  She asked about the butler but they told her that she did not need it.    How long can you sit comfortably? PT states that her less still go numb    How long can you stand comfortably? -20 minutes    Patient Stated Goals less swelling , to be able to go up down steps better    Currently in Pain? No/denies                    LYMPHEDEMA/ONCOLOGY QUESTIONNAIRE - 07/24/20 0001       Right Lower Extremity Lymphedema   20 cm Proximal to Suprapatella 59.8 cm    10 cm Proximal to Suprapatella 51 cm   53.8   At Midpatella/Popliteal Crease 40.9 cm   40.9   30 cm Proximal to Floor at Lateral Plantar Foot 37.5 cm   37.8   20 cm Proximal to Floor at Lateral Plantar Foot 31 1   31.1   10 cm Proximal to Floor at Lateral Malleoli 25.2 cm   27   Circumference of ankle/heel 31.7 cm.   30.9   5 cm Proximal to 1st MTP Joint 21 cm   22.3   Across MTP Joint 20.7 cm   21.3     Left Lower Extremity Lymphedema  20 cm Proximal to Suprapatella 59.5 cm   was 60.4   10 cm Proximal to Suprapatella 50 cm   50.4   At Midpatella/Popliteal Crease 41.2 cm   41   30 cm Proximal to Floor at Lateral Plantar Foot 37.5 cm   38.2   20 cm Proximal to Floor at Lateral Plantar Foot 32 cm   33.7   10 cm Proximal to Floor at Lateral Malleoli 26.5 cm   30   Circumference of ankle/heel 30.8 cm.   33.3   5 cm Proximal to 1st MTP Joint 22 cm   22.8   Across MTP Joint 21 cm   21.4                               PT Education - 07/24/20 1605     Education Details measurements taken,pt is stable in her edema and is ready for discharge.  Therapist went over how to use a butler and had pt demonstrate the abiliy to don garments using butler.  PT given D/C instruction sheet explaining to use garments all day everyday, use the pump daily and complete the exercises    Person(s) Educated Patient    Methods Explanation;Demonstration;Handout;Verbal cues;Tactile cues    Comprehension Verbalized understanding;Returned demonstration              PT Short Term Goals - 07/24/20 1613       PT SHORT TERM GOAL #1   Title PT to be I in HEP to increse lymphatic circulation    Time 2    Period Weeks    Status Achieved   Pt is not starting for two weeks   Target Date 07/20/20      PT SHORT TERM GOAL #2   Title Pt to have  lost 2 cm fluid in RT LE ; 1 in Lt    Time 2    Period Weeks    Status Achieved    Target Date 07/20/20               PT Long Term Goals - 07/24/20 1614       PT LONG TERM GOAL #1   Title PT Life impact score to decrease by 10 pt s    Time 4    Period Weeks    Status Unable to assess      PT LONG TERM GOAL #2   Title Pt to have lost 3-4 cm in LT LE and 2-3 int RT to allow pain level to be at a 3/10 at the most throughout the day    Time 4    Period Weeks    Status Partially Met   pain level is down but volumes did not decrease by 3-4 cm     PT LONG TERM GOAL #3   Title Pt to be able to sit and stand for 40 minutes at a timewithout increased discomfort.  to improve her functional ability    Time 4    Period Weeks    Status Achieved                   Plan - 07/24/20 1612     Clinical Impression Statement Significant reduction noted with addition of the orange foam.  PT pleased with results and is ready for discharge.    Examination-Activity Limitations Locomotion Level;Stand;Dressing    Examination-Participation Restrictions Yard Work;Shop    Stability/Clinical Decision Making  Evolving/Moderate complexity    Rehab Potential Good    PT Frequency 3x / week    PT Duration 4 weeks    PT Treatment/Interventions Patient/family education;Manual lymph drainage;Compression bandaging    PT Next Visit Plan Discharge.    PT Home Exercise Plan ankle pump, LAQ, hip ab/adduction, marching , diaphram breathing, lymph squeeze.    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) ([place]) 04/13/2020   Chronic diastolic heart failure ([place]) 03/28/2020   Uncontrolled type 2 diabetes mellitus with hyperglycemia ([place]) 07/25/2019   Hiatal hernia 07/25/2019   Spontaneous ecchymoses 07/25/2019   Class 1 obesity due to excess calories with serious comorbidity and body mass index (BMI) of 32.0 to 32.9 in adult 07/25/2019   Peripheral vascular disease, unspecified ([place]) 03/23/2019   Uveitis 06/01/2017   Fibromyalgia 06/01/2017   DDD (degenerative disc disease), lumbar 06/01/2017   DDD (degenerative disc disease), thoracic 06/01/2017   History of hypercholesterolemia 06/01/2017   History of anxiety 06/01/2017   History of IBS 06/01/2017   Premature atrial contractions 03/30/2017   PVC's (premature ventricular contractions) 03/30/2017   Diabetes mellitus without complication ([place]) 29/24/4628   Angina pectoris ([place]) 01/15/2017   CAD (coronary artery disease) 01/12/2017   Shortness of breath 12/12/2016   Acute maxillary sinusitis 05/03/2014   Obstructive sleep apnea 11/04/2013   Multiple lung nodules 07/18/2012   Heart palpitations 03/11/2012   HTN (hypertension) 03/11/2012   Gastroesophageal reflux disease without esophagitis 07/13/2011   Sarcoidosis 02/08/2011   COPD with acute exacerbation ([place]) 02/05/2011   Hyperlipemia 09/24/2010   Atypical chest pain 09/24/2010   NSVT (nonsustained ventricular tachycardia) ([place]) 09/24/2010   Leg edema 09/24/2010    Rayetta Humphrey, PT CLT [phone]  07/24/2020, 4:15 PM  [facility] [place], [place], [address] Phone: [phone]   Fax:  [phone]  Name: Sheila Reynolds MRN: [id number] Date of Birth: 06/27/1945

## 2020-07-25 ENCOUNTER — Encounter (HOSPITAL_COMMUNITY): Payer: Medicare Other | Admitting: Physical Therapy

## 2020-07-27 ENCOUNTER — Encounter (HOSPITAL_COMMUNITY): Payer: Medicare Other

## 2020-07-30 ENCOUNTER — Encounter (HOSPITAL_COMMUNITY): Payer: Medicare Other | Admitting: Physical Therapy

## 2020-07-30 ENCOUNTER — Ambulatory Visit: Payer: Medicare Other | Admitting: Internal Medicine

## 2020-08-01 ENCOUNTER — Ambulatory Visit (INDEPENDENT_AMBULATORY_CARE_PROVIDER_SITE_OTHER): Payer: Medicare Other | Admitting: Nurse Practitioner

## 2020-08-01 ENCOUNTER — Encounter: Payer: Self-pay | Admitting: Nurse Practitioner

## 2020-08-01 ENCOUNTER — Other Ambulatory Visit: Payer: Self-pay

## 2020-08-01 ENCOUNTER — Encounter (HOSPITAL_COMMUNITY): Payer: Medicare Other | Admitting: Physical Therapy

## 2020-08-01 VITALS — BP 130/74 | HR 72 | Temp 98.3°F | Ht 62.4 in | Wt 173.0 lb

## 2020-08-01 DIAGNOSIS — I110 Hypertensive heart disease with heart failure: Secondary | ICD-10-CM

## 2020-08-01 DIAGNOSIS — I5032 Chronic diastolic (congestive) heart failure: Secondary | ICD-10-CM

## 2020-08-01 DIAGNOSIS — N3941 Urge incontinence: Secondary | ICD-10-CM

## 2020-08-01 DIAGNOSIS — I700 Atherosclerosis of aorta: Secondary | ICD-10-CM

## 2020-08-01 DIAGNOSIS — E1165 Type 2 diabetes mellitus with hyperglycemia: Secondary | ICD-10-CM

## 2020-08-01 DIAGNOSIS — R82998 Other abnormal findings in urine: Secondary | ICD-10-CM

## 2020-08-01 DIAGNOSIS — I25119 Atherosclerotic heart disease of native coronary artery with unspecified angina pectoris: Secondary | ICD-10-CM

## 2020-08-01 LAB — POCT URINALYSIS DIPSTICK
Bilirubin, UA: NEGATIVE
Blood, UA: NEGATIVE
Glucose, UA: NEGATIVE
Ketones, UA: NEGATIVE
Nitrite, UA: NEGATIVE
Protein, UA: NEGATIVE
Spec Grav, UA: 1.01 (ref 1.010–1.025)
Urobilinogen, UA: 0.2 E.U./dL
pH, UA: 6 (ref 5.0–8.0)

## 2020-08-01 LAB — CMP14+EGFR
ALT: 13 IU/L (ref 0–32)
AST: 13 IU/L (ref 0–40)
Albumin/Globulin Ratio: 1.9 (ref 1.2–2.2)
Albumin: 4.2 g/dL (ref 3.7–4.7)
Alkaline Phosphatase: 111 IU/L (ref 44–121)
BUN/Creatinine Ratio: 15 (ref 12–28)
BUN: 12 mg/dL (ref 8–27)
Bilirubin Total: 0.3 mg/dL (ref 0.0–1.2)
CO2: 23 mmol/L (ref 20–29)
Calcium: 8.9 mg/dL (ref 8.7–10.3)
Chloride: 102 mmol/L (ref 96–106)
Creatinine, Ser: 0.81 mg/dL (ref 0.57–1.00)
Globulin, Total: 2.2 g/dL (ref 1.5–4.5)
Glucose: 97 mg/dL (ref 65–99)
Potassium: 4.6 mmol/L (ref 3.5–5.2)
Sodium: 139 mmol/L (ref 134–144)
Total Protein: 6.4 g/dL (ref 6.0–8.5)
eGFR: 77 mL/min/{1.73_m2} (ref >59)

## 2020-08-01 LAB — CBC
Hematocrit: 34.4 % (ref 34.0–46.6)
Hemoglobin: 11.3 g/dL (ref 11.1–15.9)
MCH: 27.9 pg (ref 26.6–33.0)
MCHC: 32.8 g/dL (ref 31.5–35.7)
MCV: 85 fL (ref 79–97)
Platelets: 179 10*3/uL (ref 150–450)
RBC: 4.05 x10E6/uL (ref 3.77–5.28)
RDW: 13.8 % (ref 11.7–15.4)
WBC: 6.8 10*3/uL (ref 3.4–10.8)

## 2020-08-01 LAB — HEMOGLOBIN A1C
Est. average glucose Bld gHb Est-mCnc: 128 mg/dL
Hgb A1c MFr Bld: 6.1 % — ABNORMAL HIGH (ref 4.8–5.6)

## 2020-08-01 MED ORDER — NITROFURANTOIN MONOHYD MACRO 100 MG PO CAPS: 100.0000 mg | ORAL_CAPSULE | Freq: Two times a day (BID) | ORAL | 0 refills | Status: AC

## 2020-08-01 NOTE — Progress Notes (Signed)
I,[name],acting as a Education administrator for [employer], NP.,have documented all relevant documentation on the behalf of [employer], NP,as directed by  Bary Castilla, NP while in the presence of Bary Castilla, NP.  This visit occurred during the SARS-CoV-2 public health emergency.  Safety protocols were in place, including screening questions prior to the visit, additional usage of staff PPE, and extensive cleaning of exam room while observing appropriate contact time as indicated for disinfecting solutions.  Subjective:     Patient ID: Sheila Reynolds , female    DOB: May 28, 1945 , 75 y.o.   MRN: [id number]   Chief Complaint  Patient presents with   Diabetes   Hypertension    HPI  She presents today for DM/HTN check. She reports compliance with meds. She states that she is having moments of incontinence. She has no other concerns. She eats healthy and tries to walk as much as she can. She has to wear pads for urinary incontinence all day long.   Diabetes She presents for her follow-up diabetic visit. She has type 2 diabetes mellitus. Her disease course has been stable. There are no hypoglycemic associated symptoms. Pertinent negatives for hypoglycemia include no dizziness or headaches. Pertinent negatives for diabetes include no blurred vision, no chest pain, no polydipsia, no polyphagia, no polyuria and no weakness. There are no hypoglycemic complications. Diabetic complications include heart disease. Her weight is stable. Her breakfast blood glucose is taken between 7-8 am. Her breakfast blood glucose range is generally 90-110 mg/dl. An ACE inhibitor/angiotensin II receptor blocker is being taken. Eye exam is current.  Hypertension This is a chronic problem. The current episode started more than 1 year ago. The problem has been gradually improving since onset. The problem is controlled. Pertinent negatives include no blurred vision, chest pain, headaches or palpitations.  Risk factors for coronary artery disease include diabetes mellitus, dyslipidemia, obesity, post-menopausal state and sedentary lifestyle.    Past Medical History:  Diagnosis Date   Anemia    3 months ago anemic   Anxiety    on meds   Arthritis    "all over" (01/15/2017)   Asthma    Bronchitis with emphysema    Chest pain    Chronic bronchitis ([place])    Coronary artery disease    a. 01/2017 she underwent orbital atherectomy/DES to the proxmal LAD and PTCA to ostial D2. 2D Echo 01/15/17 showed mild LVH, EF 60-65%, grade 1 DD.   Family history of anesthesia complication    daughter N/V   Fibromyalgia    GERD (gastroesophageal reflux disease)    on meds   Glaucoma    Heart murmur    History of hiatal hernia    Hx of echocardiogram    Echo (03/2013):  Tech limited; Mild focal basal septal hypertrophy, EF 60-65%, normal RVF   Hyperlipidemia    Hypertension    Nonsustained ventricular tachycardia (HCC)    OSA on CPAP    Premature atrial contractions    PVC (premature ventricular contraction)    a. Holter 12/16: NSR, occ PAC,PVCs   Sarcoidosis    Type II diabetes mellitus ([place])      Family History  Problem Relation Age of Onset   Heart disease Father    Diabetes Father    Glaucoma Father    Cancer Mother        unknown type, ?lung   Heart disease Paternal Grandmother    Cancer Paternal Grandmother        unknown  type   Diabetes Brother    Glaucoma Brother      Current Outpatient Medications:    nitrofurantoin, macrocrystal-monohydrate, (MACROBID) 100 MG capsule, Take 1 capsule (100 mg total) by mouth 2 (two) times daily for 7 days., Disp: 14 capsule, Rfl: 0   acetaminophen (TYLENOL) 500 MG tablet, Take 1,000 mg by mouth 2 (two) times daily as needed for moderate pain or headache., Disp: , Rfl:    albuterol (PROVENTIL) (2.5 MG/3ML) 0.083% nebulizer solution, Take 3 mLs (2.5 mg total) by nebulization daily as needed for wheezing or shortness of breath., Disp: 75 mL, Rfl: 12    albuterol (VENTOLIN HFA) 108 (90 Base) MCG/ACT inhaler, Inhale 2 puffs into the lungs every 6 (six) hours as needed for wheezing or shortness of breath., Disp: 18 g, Rfl: 12   ALPRAZolam (XANAX) 0.5 MG tablet, For sedation before MRI scan; take 1 tab 1 hour before scan; may repeat 1 tab 15 min before scan, Disp: 2 tablet, Rfl: 0   aspirin EC 81 MG tablet, Take 81 mg by mouth daily. Swallow whole., Disp: , Rfl:    atorvastatin (LIPITOR) 80 MG tablet, TAKE 1 TABLET(80 MG) BY MOUTH DAILY, Disp: 90 tablet, Rfl: 1   Blood Glucose Monitoring Suppl (ACCU-CHEK AVIVA PLUS) w/Device KIT, Use to check blood sugars 3 times a day. Dx code e11.65, Disp: 1 kit, Rfl: 3   brimonidine (ALPHAGAN P) 0.1 % SOLN, Place 1 drop into both eyes 2 (two) times daily. , Disp: , Rfl:    brinzolamide (AZOPT) 1 % ophthalmic suspension, Place 1 drop into both eyes 3 (three) times daily., Disp: , Rfl:    carvedilol (COREG) 12.5 MG tablet, TAKE 1 TABLET(12.5 MG) BY MOUTH TWICE DAILY, Disp: 180 tablet, Rfl: 3   clopidogrel (PLAVIX) 75 MG tablet, Take 1 tablet (75 mg total) by mouth daily., Disp: 90 tablet, Rfl: 3   furosemide (LASIX) 40 MG tablet, Take 1 tablet (40 mg total) by mouth every other day., Disp: 90 tablet, Rfl: 1   isosorbide mononitrate (IMDUR) 60 MG 24 hr tablet, TAKE 1 TABLET(60 MG) BY MOUTH DAILY, Disp: 90 tablet, Rfl: 3   JANUMET 50-500 MG tablet, TAKE 1 TABLET BY MOUTH TWICE DAILY WITH A MEAL, Disp: 180 tablet, Rfl: 1   Lancets (ONETOUCH DELICA PLUS [id number]) MISC, CHECK BLOOD SUGAR BEFORE BREAKFAST AND DINNER, Disp: 100 each, Rfl: 1   metoprolol tartrate (LOPRESSOR) 25 MG tablet, Take 0.5-1 tablets (12.5-25 mg total) by mouth as needed (PALPITATIONS)., Disp: 90 tablet, Rfl: 2   Misc Natural Products (FOCUSED MIND PO), Take 4 tablets by mouth daily. For memory, Disp: , Rfl:    montelukast (SINGULAIR) 10 MG tablet, TAKE 1 TABLET(10 MG) BY MOUTH EVERY EVENING, Disp: 90 tablet, Rfl: 1   Nebulizers  (COMPRESSOR/NEBULIZER) MISC, Use as directed, Disp: 1 each, Rfl: 0   nitroGLYCERIN (NITROSTAT) 0.4 MG SL tablet, Place 1 tablet (0.4 mg total) under the tongue every 5 (five) minutes as needed for chest pain., Disp: 25 tablet, Rfl: prn   pantoprazole (PROTONIX) 40 MG tablet, TAKE 1 TABLET(40 MG) BY MOUTH DAILY, Disp: 30 tablet, Rfl: 11   Polyvinyl Alcohol-Povidone PF (REFRESH) 1.4-0.6 % SOLN, Place 1-2 drops into both eyes 3 (three) times daily as needed (dry/irritated eyes.)., Disp: , Rfl:    potassium chloride SA (KLOR-CON) 20 MEQ tablet, Take 1 tablet (20 mEq total) by mouth every other day., Disp: 90 tablet, Rfl: 1   prednisoLONE acetate (PRED FORTE) 1 % ophthalmic suspension, Place  1 drop into both eyes 4 (four) times daily. , Disp: , Rfl:    pregabalin (LYRICA) 75 MG capsule, TAKE 1 CAPSULE(75 MG) BY MOUTH TWICE DAILY, Disp: 180 capsule, Rfl: 1   Travoprost, BAK Free, (TRAVATAN) 0.004 % SOLN ophthalmic solution, Place 1 drop into the right eye at bedtime. , Disp: , Rfl:    umeclidinium-vilanterol (ANORO ELLIPTA) 62.5-25 MCG/INH AEPB, Inhale 1 puff into the lungs daily., Disp: 14 each, Rfl: 0  Current Facility-Administered Medications:    0.9 %  sodium chloride infusion, , Intravenous, PRN, Eileen Stanford, PA-C   Allergies  Allergen Reactions   Crestor [Rosuvastatin Calcium] Other (See Comments)    muscle aches   Demerol  [Meperidine Hcl]     Other reaction(s): Hallucinations   Shellfish Allergy Itching and Other (See Comments)    Crab, shrimp and lobster ---lips itch and tingle Was told not to eat again after having a allergy test. Lobster, crab and shrimp      Review of Systems  Constitutional: Negative.  Negative for chills and fever.  HENT:  Negative for congestion.   Eyes:  Negative for blurred vision.  Respiratory: Negative.    Cardiovascular: Negative.  Negative for chest pain and palpitations.  Gastrointestinal: Negative.   Endocrine: Negative for polydipsia,  polyphagia and polyuria.  Genitourinary:  Positive for enuresis and urgency.  Musculoskeletal:  Negative for arthralgias, joint swelling and myalgias.  Neurological: Negative.  Negative for dizziness, weakness and headaches.    Today's Vitals   08/01/20 0952  BP: 130/74  Pulse: 72  Temp: 98.3 F (36.8 C)  TempSrc: Oral  Weight: 173 lb (78.5 kg)  Height: 5' 2.4" (1.585 m)   Body mass index is 31.24 kg/m.   Objective:  Physical Exam Constitutional:      Appearance: Normal appearance.  HENT:     Head: Normocephalic and atraumatic.  Cardiovascular:     Rate and Rhythm: Normal rate and regular rhythm.     Pulses: Normal pulses.     Heart sounds: Normal heart sounds. No murmur heard. Pulmonary:     Effort: Pulmonary effort is normal. No respiratory distress.     Breath sounds: Normal breath sounds. No wheezing.  Skin:    General: Skin is warm and dry.     Capillary Refill: Capillary refill takes less than 2 seconds.  Neurological:     Mental Status: She is alert and oriented to person, place, and time.        Assessment And Plan:     1. Uncontrolled type 2 diabetes mellitus with hyperglycemia ([place]) --Discussed with patient the importance of glycemic control and long term complications from uncontrolled diabetes. Discussed with the patient the importance of compliance with home glucose monitoring, diet which includes decrease amount of sugary drinks and foods. Importance of exercise was also discussed with the patient. Importance of eye exams, self foot care and compliance to office visits was also discussed with the patient.  -Completed foot exam  -chronic, stable, continue meds  - CMP14+EGFR - CBC no Diff - Hemoglobin A1c  2. 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 heart failure (HCC) -Limit the intake of processed foods and salt intake. You should increase your intake of green vegetables and fruits. Limit the use of alcohol. Limit fast foods and  fried foods. Avoid high fatty saturated and trans fat foods. Keep yourself hydrated with drinking water. Avoid red meats. Eat lean meats instead. Exercise for atleast 30-45 min for atleast 4-5 times  a week.  -Chronic, stable, continue meds  - CMP14+EGFR - CBC no Diff  3. Atherosclerosis of aorta (HCC) -Continue Lipitor 80 mg once daily   4. Leukocytes in urine - nitrofurantoin, macrocrystal-monohydrate, (MACROBID) 100 MG capsule; Take 1 capsule (100 mg total) by mouth 2 (two) times daily for 7 days.  Dispense: 14 capsule; Refill: 0 - Culture, Urine  5. Urge incontinence of urine - POCT Urinalysis Dipstick (17981) -Advised patient to do Kegel exercises  - Ambulatory referral to Urology   The patient was encouraged to call or send a message through [facility] for any questions or concerns.   Follow up: if symptoms persist or do not get better.   Patient was given opportunity to ask questions. Patient verbalized understanding of the plan and was able to repeat key elements of the plan. All questions were answered to their satisfaction.  Raman Dedria Endres, DNP   I, Raman Lipa Knauff have reviewed all documentation for this visit. The documentation on 07/01/20 for the exam, diagnosis, procedures, and orders are all accurate and complete.   IF YOU HAVE BEEN REFERRED TO A SPECIALIST, IT MAY TAKE 1-2 WEEKS TO SCHEDULE/PROCESS THE REFERRAL. IF YOU HAVE NOT HEARD FROM US/SPECIALIST IN TWO WEEKS, PLEASE GIVE [place] A CALL AT [phone] X [phone].   THE PATIENT IS ENCOURAGED TO PRACTICE SOCIAL DISTANCING DUE TO THE COVID-19 PANDEMIC.

## 2020-08-01 NOTE — Patient Instructions (Signed)

## 2020-08-02 DIAGNOSIS — H2013 Chronic iridocyclitis, bilateral: Secondary | ICD-10-CM | POA: Diagnosis not present

## 2020-08-02 DIAGNOSIS — G44219 Episodic tension-type headache, not intractable: Secondary | ICD-10-CM | POA: Diagnosis not present

## 2020-08-02 DIAGNOSIS — H35371 Puckering of macula, right eye: Secondary | ICD-10-CM | POA: Diagnosis not present

## 2020-08-02 DIAGNOSIS — H3581 Retinal edema: Secondary | ICD-10-CM | POA: Diagnosis not present

## 2020-08-02 DIAGNOSIS — H5712 Ocular pain, left eye: Secondary | ICD-10-CM | POA: Diagnosis not present

## 2020-08-02 LAB — HM DIABETES EYE EXAM

## 2020-08-03 ENCOUNTER — Encounter (HOSPITAL_COMMUNITY): Payer: Medicare Other | Admitting: Physical Therapy

## 2020-08-06 ENCOUNTER — Encounter (HOSPITAL_COMMUNITY): Payer: Medicare Other | Admitting: Physical Therapy

## 2020-08-06 ENCOUNTER — Other Ambulatory Visit: Payer: Self-pay

## 2020-08-06 ENCOUNTER — Other Ambulatory Visit: Payer: Medicare Other

## 2020-08-06 DIAGNOSIS — H401113 Primary open-angle glaucoma, right eye, severe stage: Secondary | ICD-10-CM | POA: Diagnosis not present

## 2020-08-06 DIAGNOSIS — M316 Other giant cell arteritis: Secondary | ICD-10-CM

## 2020-08-06 DIAGNOSIS — H2013 Chronic iridocyclitis, bilateral: Secondary | ICD-10-CM | POA: Diagnosis not present

## 2020-08-06 DIAGNOSIS — H401122 Primary open-angle glaucoma, left eye, moderate stage: Secondary | ICD-10-CM | POA: Diagnosis not present

## 2020-08-07 ENCOUNTER — Telehealth: Payer: Medicare Other

## 2020-08-07 ENCOUNTER — Telehealth: Payer: Self-pay | Admitting: Neurology

## 2020-08-07 ENCOUNTER — Ambulatory Visit (INDEPENDENT_AMBULATORY_CARE_PROVIDER_SITE_OTHER): Payer: Medicare Other

## 2020-08-07 DIAGNOSIS — E1165 Type 2 diabetes mellitus with hyperglycemia: Secondary | ICD-10-CM

## 2020-08-07 DIAGNOSIS — I739 Peripheral vascular disease, unspecified: Secondary | ICD-10-CM

## 2020-08-07 DIAGNOSIS — I5032 Chronic diastolic (congestive) heart failure: Secondary | ICD-10-CM | POA: Diagnosis not present

## 2020-08-07 DIAGNOSIS — M797 Fibromyalgia: Secondary | ICD-10-CM

## 2020-08-07 DIAGNOSIS — E559 Vitamin D deficiency, unspecified: Secondary | ICD-10-CM

## 2020-08-07 DIAGNOSIS — I890 Lymphedema, not elsewhere classified: Secondary | ICD-10-CM

## 2020-08-07 DIAGNOSIS — I110 Hypertensive heart disease with heart failure: Secondary | ICD-10-CM | POA: Diagnosis not present

## 2020-08-07 LAB — SEDIMENTATION RATE: Sed Rate: 13 mm/hr (ref 0–40)

## 2020-08-07 LAB — C-REACTIVE PROTEIN: CRP: 10 mg/L (ref 0–10)

## 2020-08-07 NOTE — Telephone Encounter (Signed)
Pt called stating that she has had a headache that takes her whole left side behind the eye. Pt states she has seen an eye doctor and they recommended for her to see her neurologist sooner than her scheduled appt. Please advise.

## 2020-08-08 ENCOUNTER — Encounter (HOSPITAL_COMMUNITY): Payer: Medicare Other

## 2020-08-08 ENCOUNTER — Other Ambulatory Visit: Payer: Self-pay | Admitting: Nurse Practitioner

## 2020-08-08 DIAGNOSIS — A498 Other bacterial infections of unspecified site: Secondary | ICD-10-CM

## 2020-08-08 LAB — URINE CULTURE

## 2020-08-08 MED ORDER — AMOXICILLIN-POT CLAVULANATE 875-125 MG PO TABS: 1.0000 | ORAL_TABLET | Freq: Two times a day (BID) | ORAL | 0 refills | Status: AC

## 2020-08-10 ENCOUNTER — Encounter (HOSPITAL_COMMUNITY): Payer: Medicare Other

## 2020-08-10 NOTE — Chronic Care Management (AMB) (Signed)
Chronic Care Management   CCM RN Visit Note  08/07/2020 Name: Sheila Reynolds MRN: [id number] DOB: October 30, 1945  Subjective: Sheila Reynolds is a 75 y.o. year old female who is a primary care patient of Glendale Chard, MD. The care management team was consulted for assistance with disease management and care coordination needs.    Engaged with patient by telephone for follow up visit in response to provider referral for case management and/or care coordination services.   Consent to Services:  The patient was given information about Chronic Care Management services, agreed to services, and gave verbal consent prior to initiation of services.  Please see initial visit note for detailed documentation.   Patient agreed to services and verbal consent obtained.   Assessment: Review of patient past medical history, allergies, medications, health status, including review of consultants reports, laboratory and other test data, was performed as part of comprehensive evaluation and provision of chronic care management services.   SDOH (Social Determinants of Health) assessments and interventions performed:  Yes, no acute challenges   CCM Care Plan  Allergies  Allergen Reactions   Crestor [Rosuvastatin Calcium] Other (See Comments)    muscle aches   Demerol  [Meperidine Hcl]     Other reaction(s): Hallucinations   Shellfish Allergy Itching and Other (See Comments)    Crab, shrimp and lobster ---lips itch and tingle Was told not to eat again after having a allergy test. Lobster, crab and shrimp     Outpatient Encounter Medications as of 08/07/2020  Medication Sig   acetaminophen (TYLENOL) 500 MG tablet Take 1,000 mg by mouth 2 (two) times daily as needed for moderate pain or headache.   albuterol (PROVENTIL) (2.5 MG/3ML) 0.083% nebulizer solution Take 3 mLs (2.5 mg total) by nebulization daily as needed for wheezing or shortness of breath.   albuterol (VENTOLIN HFA) 108 (90 Base) MCG/ACT  inhaler Inhale 2 puffs into the lungs every 6 (six) hours as needed for wheezing or shortness of breath.   ALPRAZolam (XANAX) 0.5 MG tablet For sedation before MRI scan; take 1 tab 1 hour before scan; may repeat 1 tab 15 min before scan   aspirin EC 81 MG tablet Take 81 mg by mouth daily. Swallow whole.   atorvastatin (LIPITOR) 80 MG tablet TAKE 1 TABLET(80 MG) BY MOUTH DAILY   Blood Glucose Monitoring Suppl (ACCU-CHEK AVIVA PLUS) w/Device KIT Use to check blood sugars 3 times a day. Dx code e11.65   brimonidine (ALPHAGAN P) 0.1 % SOLN Place 1 drop into both eyes 2 (two) times daily.    brinzolamide (AZOPT) 1 % ophthalmic suspension Place 1 drop into both eyes 3 (three) times daily.   carvedilol (COREG) 12.5 MG tablet TAKE 1 TABLET(12.5 MG) BY MOUTH TWICE DAILY   clopidogrel (PLAVIX) 75 MG tablet Take 1 tablet (75 mg total) by mouth daily.   isosorbide mononitrate (IMDUR) 60 MG 24 hr tablet TAKE 1 TABLET(60 MG) BY MOUTH DAILY   JANUMET 50-500 MG tablet TAKE 1 TABLET BY MOUTH TWICE DAILY WITH A MEAL   Lancets (ONETOUCH DELICA PLUS [id number]) MISC CHECK BLOOD SUGAR BEFORE BREAKFAST AND DINNER   Misc Natural Products (FOCUSED MIND PO) Take 4 tablets by mouth daily. For memory   montelukast (SINGULAIR) 10 MG tablet TAKE 1 TABLET(10 MG) BY MOUTH EVERY EVENING   Nebulizers (COMPRESSOR/NEBULIZER) MISC Use as directed   [EXPIRED] nitrofurantoin, macrocrystal-monohydrate, (MACROBID) 100 MG capsule Take 1 capsule (100 mg total) by mouth 2 (two) times daily for 7 days.  pantoprazole (PROTONIX) 40 MG tablet TAKE 1 TABLET(40 MG) BY MOUTH DAILY   Polyvinyl Alcohol-Povidone PF (REFRESH) 1.4-0.6 % SOLN Place 1-2 drops into both eyes 3 (three) times daily as needed (dry/irritated eyes.).   potassium chloride SA (KLOR-CON) 20 MEQ tablet Take 1 tablet (20 mEq total) by mouth every other day.   prednisoLONE acetate (PRED FORTE) 1 % ophthalmic suspension Place 1 drop into both eyes 4 (four) times daily.     pregabalin (LYRICA) 75 MG capsule TAKE 1 CAPSULE(75 MG) BY MOUTH TWICE DAILY   Travoprost, BAK Free, (TRAVATAN) 0.004 % SOLN ophthalmic solution Place 1 drop into the right eye at bedtime.    umeclidinium-vilanterol (ANORO ELLIPTA) 62.5-25 MCG/INH AEPB Inhale 1 puff into the lungs daily.   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) ([place]) 04/13/2020   Chronic diastolic heart failure ([place]) 03/28/2020   Uncontrolled type 2 diabetes mellitus with hyperglycemia ([place]) 07/25/2019   Hiatal hernia 07/25/2019   Spontaneous ecchymoses 07/25/2019   Class 1 obesity due to excess calories with serious comorbidity and body mass index (BMI) of 32.0 to 32.9 in adult 07/25/2019   Peripheral vascular disease, unspecified ([place]) 03/23/2019   Uveitis 06/01/2017   Fibromyalgia 06/01/2017   DDD (degenerative disc disease), lumbar 06/01/2017   DDD (degenerative disc disease), thoracic 06/01/2017   History of hypercholesterolemia 06/01/2017   History of anxiety 06/01/2017   History of IBS 06/01/2017   Premature atrial contractions 03/30/2017   PVC's (premature ventricular contractions) 03/30/2017   Diabetes mellitus without complication ([place]) 22/03/5425   Angina pectoris ([place]) 01/15/2017   CAD (coronary artery disease) 01/12/2017   Shortness of breath 12/12/2016   Acute maxillary sinusitis 05/03/2014   Obstructive sleep apnea 11/04/2013   Multiple lung nodules 07/18/2012   Heart palpitations 03/11/2012   HTN (hypertension) 03/11/2012   Gastroesophageal reflux disease without esophagitis 07/13/2011   Sarcoidosis 02/08/2011   COPD with acute exacerbation ([place]) 02/05/2011   Hyperlipemia 09/24/2010    Atypical chest pain 09/24/2010   NSVT (nonsustained ventricular tachycardia) ([place]) 09/24/2010   Leg edema 09/24/2010    Conditions to be addressed/monitored: Uncontrolled type two DM with hyperglycemia, Vitamin D deficiency,  Fibromyalgia, Hypertension, Peripheral Vascular disease, Lymphedema of both lower extremities  Care Plan : COVID 19  Updates made by Lynne Logan, RN since 08/10/2020 12:00 AM  Completed 08/10/2020   Problem: COVID 19 Infection Resolved 08/07/2020  Priority: High     Goal: COVID 19 Infection -complications minimized or prevented Completed 08/07/2020  Start Date: 06/05/2020  Expected End Date: 07/05/2020  Recent Progress: On track  Priority: High  Note:   Current Barriers:  Ineffective Self Health Maintenance  Clinical Goal(s):  Collaboration with Glendale Chard, MD regarding development and update of comprehensive plan of care as evidenced by provider attestation and co-signature Inter-disciplinary care team collaboration (see longitudinal plan of care) patient will work with care management team to address care coordination and chronic disease management needs related to Disease Management Educational Needs Care Coordination Medication Management and Education Psychosocial Support   Interventions:  08/07/20 completed successful outbound call to patient  Evaluation of current treatment plan related to  COVID 19 , self-management and patient's adherence to plan as established by  provider. Collaboration with Glendale Chard, MD regarding development and update of comprehensive plan of care as evidenced by provider attestation       and co-signature Inter-disciplinary care team collaboration (see longitudinal plan of care) Determined patient has made a complete recovery from [place] and denies having further need for evaluation for this condition Discussed plans with patient for ongoing care management follow up and provided patient with direct contact information  for care management team Self Care Activities:  Follow PCP recommendations as directed   Follow Up Plan: No further follow up required     Care Plan : Diabetes Type 2 (Adult)  Updates made by Lynne Logan, RN since 08/10/2020 12:00 AM     Problem: Glycemic Management (Diabetes, Type 2)   Priority: High     Long-Range Goal: Glycemic Management Optimized   Start Date: 08/07/2020  Expected End Date: 08/07/2021  This Visit's Progress: On track  Priority: High  Note:   Objective:  Lab Results  Component Value Date   HGBA1C 6.1 (H) 08/01/2020   Lab Results  Component Value Date   CREATININE 0.81 08/01/2020   CREATININE 0.76 03/28/2020   CREATININE 0.84 11/28/2019   Lab Results  Component Value Date   EGFR 77 08/01/2020   Current Barriers:  Knowledge Deficits related to basic Diabetes pathophysiology and self care/management Knowledge Deficits related to medications used for management of diabetes Case Manager Clinical Goal(s):  patient will demonstrate improved adherence to prescribed treatment plan for diabetes self care/management as evidenced by: daily monitoring and recording of CBG  adherence to ADA/ carb modified diet exercise 5 days/week adherence to prescribed medication regimen contacting provider for new or worsened symptoms or questions Interventions:  Collaboration with Glendale Chard, MD regarding development and update of comprehensive plan of care as evidenced by provider attestation and co-signature Inter-disciplinary care team collaboration (see longitudinal plan of care) Provided education to patient about basic DM disease process Review of patient status, including review of consultant's reports, relevant laboratory and other test results, and medications completed. Reviewed medications with patient and discussed importance of medication adherence Educated patient on dietary and exercise recommendations; daily glycemic control FBS 80-130, <180 after  meals;15'15' rule Advised patient, providing education and rationale, to check cbg daily before meals and at bedtime and record, calling the CCM team and or PCP for findings outside established parameters Mailed printed educational materials related to Low Carb Protein Smoothies for Diabetes; Mediterranean diet for Diabetes Discussed plans with patient for ongoing care management follow up and provided patient with direct contact information for care management team Self-Care Activities Self administers oral medications as prescribed Attends all scheduled provider appointments Checks blood sugars as prescribed and utilize hyper and hypoglycemia protocol as needed Adheres to prescribed ADA/carb modified Patient Goals: - check blood sugar at prescribed times - check blood sugar if I feel it is too high or too low - enter blood sugar readings and medication or insulin into daily log - take the blood sugar log to all doctor visits - take the blood sugar meter to all doctor visits - manage portion size - keep appointment with eye doctor - schedule appointment with eye doctor - check feet daily for cuts, sores or redness - do heel pump exercise 2 to 3 times each day - keep feet up while sitting - trim toenails straight across - wash and dry feet carefully every day - wear comfortable, cotton socks - wear comfortable, well-fitting shoes  Follow Up Plan:  Telephone follow up appointment with care management team member scheduled for: 11/08/20    Care Plan : Lymphedema  Updates made by Lynne Logan, RN since 08/10/2020 12:00 AM     Problem: Lymphedema   Priority: High     Long-Range Goal: Lymphedema - complications prevented or minimized   Start Date: 08/07/2020  Expected End Date: 08/07/2021  Priority: High  Note:   Current Barriers:  Ineffective Self Health Maintenance  Clinical Goal(s):  Collaboration with Glendale Chard, MD regarding development and update of comprehensive plan  of care as evidenced by provider attestation and co-signature Inter-disciplinary care team collaboration (see longitudinal plan of care) patient will work with care management team to address care coordination and chronic disease management needs related to Disease Management Educational Needs Care Coordination Medication Management and Education Psychosocial Support   Interventions:  08/07/20 completed successful outbound call with patient  Evaluation of current treatment plan related to  Lymphedema , self-management and patient's adherence to plan as established by provider. Collaboration with Glendale Chard, MD regarding development and update of comprehensive plan of care as evidenced by provider attestation       and co-signature Inter-disciplinary care team collaboration (see longitudinal plan of care) Determined patient completed outpatient rehabilitation at [facility] for treatment of lymphedema to her lower extremities on 07/24/20, bilaterally with the following noted: Visits from Start of Care: 10 Current functional level related to goals / functional outcomes: See below  Remaining deficits: Slight edema Education / Equipment: HEP, the importance of using her pump and garments daily   Patient agrees to discharge. Patient goals were met. Patient is being discharged due to meeting the stated rehab goals.  Educated patient on disease process and long term management of lymphedema Determined patient is adhering to the home treatment plan provided by MD and outpatient PT, she feels her lymphedema is better managed at this point in time Educated on prevention of complications and when to call the doctor  Discussed plans with patient for ongoing care management follow up and provided patient with direct contact information for care management team Self Care Activities:  Patient verbalizes understanding of plan to use compression wraps and lymphedema pumps as directed Self administers  medications as prescribed Attends all scheduled provider appointments Calls pharmacy for medication refills Calls provider office for new concerns or questions Patient Goals: - prevent or minimize complications secondary to bilateral lower extremity lymphedema  Follow Up Plan: Telephone follow up appointment with care management team member scheduled for: 11/08/20     Care Plan : Vitamin D deficiency  Updates made by Lynne Logan, RN since 08/10/2020 12:00 AM     Problem: Vitamin D deficiency   Priority: Medium     Long-Range Goal: Vitamin D deficiency - treatment optimized   Start Date: 08/07/2020  Expected End Date: 08/07/2021  This Visit's Progress: On track  Priority: Medium  Note:   Current Barriers:  Ineffective Self Health Maintenance  Clinical Goal(s):  Collaboration with Glendale Chard, MD regarding development and update of comprehensive plan of care as evidenced by provider attestation and co-signature Inter-disciplinary care team collaboration (see longitudinal plan of care) patient will work with care management team to address care coordination and chronic disease management needs related to Disease Management Educational Needs Care Coordination Medication Management and Education Psychosocial Support   Interventions:  08/07/20 completed successful outbound call with patient  Evaluation of current treatment plan related to  Vitamin D deficiency , self-management and patient's adherence  to plan as established by provider. Collaboration with Glendale Chard, MD regarding development and update of comprehensive plan of care as evidenced by provider attestation       and co-signature Inter-disciplinary care team collaboration (see longitudinal plan of care) Provided education to patient about basic disease process related to Vitamin D deficiency Review of patient status, including review of consultant's reports, relevant laboratory and other test results, and  medications completed. Reviewed medications with patient and discussed importance of medication adherence Educated on dietary recommendations, get at least 15 min of natural sunlight when possible, take Vitamin D supplement as directed  Discussed plans with patient for ongoing care management follow up and provided patient with direct contact information for care management team Self Care Activities:  Continue to keep all scheduled follow up appointments Take medications as directed  Let your healthcare team know if you are unable to take your medications Call your pharmacy for refills at least 7 days prior to running out of medication Patient Goals: - eat Vitamin D rich foods - get 15 minutes of natural sunlight when possible - take Vitamin D supplement as directed   Follow Up Plan: Telephone follow up appointment with care management team member scheduled for: 11/08/20     Plan:Telephone follow up appointment with care management team member scheduled for:  11/08/20  Barb Merino, RN, BSN, CCM Care Management Coordinator [facility]  Direct Phone: [phone]

## 2020-08-10 NOTE — Patient Instructions (Signed)
Goals Addressed         Lympedema - complications prevented or minimzed   On track     Timeframe:  Long-Range Goal Priority:  High Start Date: 08/07/20                            Expected End Date: 08/07/21  Next Scheduled Follow up: 11/08/20      Self Care Activities:  Patient verbalizes understanding of plan to use compression wraps and lymphedema pumps as directed Self administers medications as prescribed Attends all scheduled provider appointments Calls pharmacy for medication refills Calls provider office for new concerns or questions Patient Goals: - prevent or minimize complications secondary to bilateral lower extremity lymphedema                        Monitor and Manage My Blood Sugar-Diabetes Type 2   On track     Timeframe:  Long-Range Goal Priority:  High Start Date:  08/07/20                           Expected End Date:  08/07/21                     Follow Up Date 11/08/20    Self-Care Activities Self administers oral medications as prescribed Attends all scheduled provider appointments Checks blood sugars as prescribed and utilize hyper and hypoglycemia protocol as needed Adheres to prescribed ADA/carb modified Patient Goals: - check blood sugar at prescribed times - check blood sugar if I feel it is too high or too low - enter blood sugar readings and medication or insulin into daily log - take the blood sugar log to all doctor visits - take the blood sugar meter to all doctor visits - manage portion size   Why is this important?   Checking your blood sugar at home helps to keep it from getting very high or very low.  Writing the results in a diary or log helps the doctor know how to care for you.  Your blood sugar log should have the time, date and the results.  Also, write down the amount of insulin or other medicine that you take.  Other information, like what you ate, exercise done and how you were feeling, will also be helpful.     Notes:         Obtain Eye Exam-Diabetes Type 2   On track     Timeframe:  Long-Range Goal Priority:  Medium Start Date: 08/07/20                            Expected End Date:  08/07/21                     Follow Up Date 11/08/20   Self-Care Activities Self administers oral medications as prescribed Attends all scheduled provider appointments Checks blood sugars as prescribed and utilize hyper and hypoglycemia protocol as needed Adheres to prescribed ADA/carb modified Patient Goals: - keep appointment with eye doctor - schedule appointment with eye doctor   Why is this important?   Eye check-ups are important when you have diabetes.  Vision loss can be prevented.    Notes:        Perform Foot Care-Diabetes Type 2   On track  Timeframe:  Long-Range Goal Priority:  Medium Start Date:  08/07/20                           Expected End Date:  08/07/21                     Follow Up Date 11/08/20    Self-Care Activities Self administers oral medications as prescribed Attends all scheduled provider appointments Checks blood sugars as prescribed and utilize hyper and hypoglycemia protocol as needed Adheres to prescribed ADA/carb modified Patient Goals: - check feet daily for cuts, sores or redness - do heel pump exercise 2 to 3 times each day - keep feet up while sitting - trim toenails straight across - wash and dry feet carefully every day - wear comfortable, cotton socks - wear comfortable, well-fitting shoes   Why is this important?   Good foot care is very important when you have diabetes.  There are many things you can do to keep your feet healthy and catch a problem early.    Notes:        Vitamin D deficiency - treatment optimized   On track     Timeframe:  Long-Range Goal Priority:  Medium Start Date:  08/07/20                           Expected End Date:  08/07/21  Next Scheduled follow up: 11/08/20       Self Care Activities:  Continue to keep all scheduled follow up  appointments Take medications as directed  Let your healthcare team know if you are unable to take your medications Call your pharmacy for refills at least 7 days prior to running out of medication Patient Goals: - eat Vitamin D rich foods - get 15 minutes of natural sunlight when possible - take Vitamin D supplement as directed

## 2020-08-14 ENCOUNTER — Encounter (HOSPITAL_COMMUNITY): Payer: Medicare Other | Admitting: Physical Therapy

## 2020-08-14 NOTE — Progress Notes (Signed)
HPI  F former smoker followed for bronchitis, hx  Occular sarcoid, bronchitis/ nodules, OSA complicated by GERD, glaucoma, DM2 NPSG 12/25/03- AHI  2.9/ hr, desaturation to 91%, body weight 164 lbs NPSG-10/18/13- Mild OSA, AHI 9.3/ hr, weight 164 lbs  ACE 09/17/15-47-WNL ACE level 08/18/16-30 ACE level 05/14/20- 22 PFT: 01/07/2011-FEV1 1.59/79%, FEV1/FVC 0.73, FEF 25-75% improved to 38% with bronchodilator. TLC 90% DLCO 55% Office Spirometry 06/09/16- moderate restriction of exhaled volume. FVC 1.51/68%, FEV1 1.20/70%, ratio 0.79, FEF 25-75% 1.19/73% Nuclear Stress Test 09/04/16- EF 65%, low risk. ECHO 07/31/16- Gr 1 DD Walk test on room air 12/21/2017-minimal saturation 94%, maximum heart rate 118/minute with no stops walking 3 laps x180 m. Office Spirometry 12/21/17 PFT 04/13/20- Minimal Obstruction and Minimal Restriction, no resp to BD, Moderately severe DLCO defect WALK TEST 4/422- 2 laps, min O2 sat 94%, max HR 127.  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 Supplies were ordered from [facility], but she is awaiting small mask. Doing well with CPAP. PFT reviewed. Using rescue inhaler TID. Cough productive cream. Chronic back pain across mid thoracic level. Describes gradually worsening band-like pain around upper chest.  PFT 04/13/20- Minimal Obstruction and Minimal Restriction, no resp to BD, Moderately severe DLCO defect WALK TEST 4/422- 2 laps, min O2 sat  94%, max HR 127.  CT chest- 04/30/20- IMPRESSION: 1. 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 Repeat high-resolution chest CT is recommended in 12 months to assess for temporal changes in the appearance of the lung parenchyma. 2. In addition, there is what appears to be chronic and worsening mucoid impaction in the right upper lobe, potentially related to a chronic indolent atypical infectious process such as mycobacterial infection. Attention at time of repeat high-resolution chest CT is recommended. 3. There is also mild centrilobular and paraseptal emphysema. 4. Aortic atherosclerosis, in addition to left main and 3 vessel coronary artery disease. Assessment for potential risk factor modification, dietary therapy or pharmacologic therapy may be warranted, if clinically indicated. 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 States she had covid back in April 2022. She is still struggling with SOB especially with exertion. Still using her  cpap machine each night.  Treated with infusion for Covid in April "worse than flu". Anoro samples really helped, but didn't seek script when they ran out. Unclear if increased DOE now relates to Covid residual or lack  of Anoro. Some dry cough. No adenopathy or fever. Chronic thoracic pain is less bothersome now. Uses CPAP every night. Old machine.  ROS-see HPI     + = positive Constitutional:   No-   weight loss, night sweats, fevers, chills, fatigue, lassitude. HEENT:   +  headaches, difficulty swallowing, tooth/dental problems, sore throat,       Some  sneezing, itching, ear ache, +nasal congestion, post nasal drip,  CV: + chest pain, no-orthopnea, PND, swelling in lower extremities, anasarca, dizziness, palpitations Resp: +  shortness of breath with exertion not at rest.            productive cough,  + non-productive cough,  No- coughing up of blood.               in color of mucus.   wheezing.   Skin: No-   rash or lesions. GI:  +  heartburn, indigestion, abdominal pain, nausea, vomiting,  GU:  MS:  +  joint pain or swelling, + back pain Neuro-     nothing unusual Psych:  No- change in mood or affect. No depression or anxiety.  No memory loss.  OBJ  Afebrile General- Alert, Oriented, Affect tearful, Distress- none acute,  Skin- rash-none, lesions- none, excoriation- none Lymphadenopathy- none Head- atraumatic            Eyes- Gross vision intact, PERRLA, conjunctivae clear secretions- not injected            Ears- Hearing aid             Nose-  turbinate edema, no-Septal dev, mucus, polyps, erosion, perforation             Throat- Mallampati III , mucosa  , drainage- none, tonsils- atrophic Neck- flexible , trachea midline, no stridor , thyroid nl, carotid no bruit Chest - symmetrical excursion , unlabored           Heart/CV- RRR , no murmur , no gallop  , no rub, nl s1 s2                           - JVD- none , edema- none, stasis changes- none, varices- none           Lung-  Clear, Cough+, wheeze-none, rhonchi-none , dullness-none, rub- none           Chest wall-  Abd- No HSM Br/ Gen/ Rectal- Not done, not indicated Extrem- cyanosis- none, clubbing, none, atrophy- none, strength- nl.  +elastic hose Neuro- grossly intact to observation

## 2020-08-15 ENCOUNTER — Other Ambulatory Visit: Payer: Self-pay

## 2020-08-15 ENCOUNTER — Encounter: Payer: Self-pay | Admitting: Internal Medicine

## 2020-08-15 ENCOUNTER — Ambulatory Visit (INDEPENDENT_AMBULATORY_CARE_PROVIDER_SITE_OTHER): Payer: Medicare Other | Admitting: Internal Medicine

## 2020-08-15 ENCOUNTER — Encounter (HOSPITAL_COMMUNITY): Payer: Medicare Other | Admitting: Physical Therapy

## 2020-08-15 VITALS — BP 128/70 | HR 74 | Ht 62.5 in | Wt 174.0 lb

## 2020-08-15 DIAGNOSIS — J449 Chronic obstructive pulmonary disease, unspecified: Secondary | ICD-10-CM

## 2020-08-15 DIAGNOSIS — I25119 Atherosclerotic heart disease of native coronary artery with unspecified angina pectoris: Secondary | ICD-10-CM

## 2020-08-15 DIAGNOSIS — G4733 Obstructive sleep apnea (adult) (pediatric): Secondary | ICD-10-CM

## 2020-08-15 MED ORDER — ANORO ELLIPTA 62.5-25 MCG/INH IN AEPB: 1.0000 | INHALATION_SPRAY | Freq: Every day | RESPIRATORY_TRACT | 0 refills | Status: DC

## 2020-08-15 NOTE — Patient Instructions (Signed)
Order- Sample x 1 Anoro inhaler    inhale 1 puff, once daily  Script sent for Anoro inhaler  Order- DME [facility]- please replace old CPAP machine ([facility] if [facility] not available) Auto 5-15, mask of choice, humidifier, supplies, Airview/ card  Ms Wakeman- please let me know if the Anoro doesn't help.

## 2020-08-15 NOTE — Assessment & Plan Note (Signed)
Increased dyspnea and cough- unclear if this relates to Covid in April, or can respond to Anoro. Consider if earlier CT, check for clot, etc. Plan- resume Anoro and report is insufficient.

## 2020-08-15 NOTE — Assessment & Plan Note (Signed)
Benefits from CPAP with good compliance and control Plan- replace old machine, change to auto 5-15 as discussed

## 2020-08-16 ENCOUNTER — Encounter: Payer: Self-pay | Admitting: Cardiovascular Disease

## 2020-08-16 NOTE — Progress Notes (Signed)
Cardiology Office Note   Date:  08/23/2020   ID:  Sheila Reynolds, Sheila Reynolds November 28, 1945, MRN [id number]  PCP:  Glendale Chard, MD  Cardiologist:   Mertie Moores, MD   Chief Complaint  Patient presents with   Coronary Artery Disease   Palpitations   1. Hyperlipidemia 2.  CAD -  orbital atherectomy and PCI using Xience Sierra 3.5 x 33 mm DES to prox / mid LAD  3. Nonsustained ventricular tachycardia 4. Mild to moderate coronary artery disease by cath in 2009. 5. 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 Keturah Barre 6.  Venous insufficiency     Sheila Reynolds is a 75 y.o. female with the above noted hx.  She has continued to have some palpitations.  She has occasional episodes of lightheadedness when she has palpitations. Her main issue has been lots of total body cramps. She has been seen in the emergency room. Her lab work looked fine.  She's had lots of dyspnea especially with exertion. She has a history of sarcoidosis and is followed by Dr. Annamaria Boots.  March 11, 2012: Sheila Reynolds has had more palpitations recently.  It Appears that her metoprolol dose was decreased slightly.  We placed an event monitor on her. She did not have any episodes of atrial fibrillation. She has occasional drinker atrial contractions and occasional premature ventricular contractions. There were no life-threatening arrhythmias.  She is doing well from a cardiac standpoint.   She's not had any episodes of syncope.  She has significant shortness of breath especially when climbing stairs. This is likely due to her sarcoidosis. She denies any chest pain. Her BP is a bit elevated.  She avoids salt.    May 02, 2013 :  She presented to see Richardson Dopp in February with some increased palpitations and lightheadedness.  He recommended that she decrease her caffeine intake. He placed a 21 day event monitor and instructed her to take an extra metoprolol as needed.  The palpitations can occur at any time. They occur with rest and with  exertion. They're not constant. The last 30-45 seconds and occur multiple times through the day.  Sept. 16, 2015:  Still having palpitations.   She has had more leg edema and her medical doctor on Lasix which he now takes about every other day. She was not on potassium replacement. This may be the cause of her increased PVCs.   May 24, 2014:  Sheila Reynolds is a 75 y.o. female who presents for follow up of her palpitations   Still has these palpitations.  No syncope.   Jan. 10, 2017:  Doing ok Still has DOE  Has palpitations on occasion. Not getting much exercise .  expalined a typical exercise program .   Start slow, gradually increase   May 07, 2016;   Sheila Reynolds is seen today for follow up .  Has been having more palpitations Seem to be getting worse  Went to there ER last week -  the ER note does not describe any specific arrhythmias. No passing out but she did get lightheaded when she had palpitations on one occasion   Has rare episodes of atypical CP .  Lasts a second Not associated with exertion .  Walks 1.5 miles 3-4 times a week .   This walking does not cause any palpitation of CP   July 17, 2016 Sheila Reynolds is seen today for follow up  30 day monitor showed no significant arrhythmias  Having leg pain and swelling  during night and  day  Not worsened by exercise  No CP or dyspnea - still walking 1.5 miles 3-4 days a week   December 22, 2016:  Sheila Reynolds had a cath recently that showed a 70 % LAD stenosis . Has right arm pain but this is not exertional . Has pins and needles sensation Has DOE with stairs and carrying something heavy .  Has not really noticed any difference in how she feels on the new meds which include Imdur 30 mg , Plavix 75 ,  + cough, chronic ,  No hemoptysis. Does not exercise regularly .    January 29, 2017: Sheila Reynolds is seen today for a follow-up visit.  She recently had rotational atherectomy and stenting of her proximal LAD and mid  LAD. Still has some pins and needles sensation Has been walking in the mall.  Does not have CP . Has some DOE with that   .  Jul 09, 2017:   Sheila Reynolds is doing well.  She is status post rotational atherectomy and stent placement to her proximal/mid LAD in December, 2018. Had left eye surgery in left eye.    Occasional CP ,  Thinks it might be indigestion .  Not associated with eating or drinking . Pain was on the left side of left breast.   Lasts a few minutes Did not have much pain prior to her PCI to LAD .  Has been exercising .   Exercise does not cause any CP .  Relieved with SL NTG   BP has been elevated.   Aug. 26, 2019: Following her visit in February, she was having some chest pain.  A stress Myoview study was performed and was found to be low risk.  She has normal left ventricular systolic function and no ischemia. She is feeling quite well.  She has not had any recurrent episodes of chest pain. She is exercising some .   Has been having bilateral leg pain and swelling.  She had an arterial Doppler performed for peripheral vascular disease.  As of today the study has not yet been read.  January 29, 2018: Sheila Reynolds is seen today for 2 reasons.  She is been having more shortness of breath than normal.  She also needs preoperative evaluation.  She has a history of coronary artery disease and is status post stenting of her LAD in December 2018. Is having more DOE with any activity , climbing stairs,  Similar to how she felt prior to stenting Resolves after 5- 10 min of rest.  Has not taken a NTG   Also needs to have eyelid  surgery  Scheduled for Dec. 31.   Oct. 27. 2020  Sheila Reynolds is seen today . Hx of CAD - stenting of her LAD in Dec. 2018   Has some dyspnea.  Constant back pain , may last for hours.  Radiates through to her chest .  Has not tried any NTG. Not relieved with alka seltzer.   Has had some R eye vision issues Was found to have had 2 strokes over the  past years. 1 was an older CVA and 1 was a more recent CVA  Has seen a neurologist her in [place]. Carotid duplex scan revealed mild plaque bilaterally but no significant stenoses. Transcranial Doppler revealed some diffuse plaque but no critical stenoses.  Jun 16, 2019:  She has had some worsening shortness of breath as well as some leg edema recently.  The left leg swells worse than the right leg. Echo from  Dec. 2020 - showed normal LV systolic function with grade I diastolic dysfuncton  Venous duplex was normal  She has been seeing the podiatrist down in [place] and was advised to have a venogram for further evaluation.  Nov. 4, 2021:  Sheila Reynolds is seen today  She has had palpitations .   Associated with some ches pain.   Gets  dizzy,   palps will last all day long.    Occurs randomly during the day .   Not associated with exercise  Will place an event monitor .    August 23, 2020: Sheila Reynolds is seen today.  Hx of  CAD , stenting of LAD When I last saw her in November she was having some episodes of chest pains as well as some palpitations. Lexiscan Myoview study from December 20, 2019 was normal. Event monitor from December, 2021 reveals sinus rhythm with rare premature ventricular contractions.  There was no evidence of nonsustained ventricular tachycardia.  Is not exercising as much  Has lots of leg lymphedema   Past Medical History:  Diagnosis Date   Anemia    3 months ago anemic   Anxiety    on meds   Arthritis    "all over" (01/15/2017)   Asthma    Bronchitis with emphysema    Chest pain    Chronic bronchitis ([place])    Coronary artery disease    a. 01/2017 she underwent orbital atherectomy/DES to the proxmal LAD and PTCA to ostial D2. 2D Echo 01/15/17 showed mild LVH, EF 60-65%, grade 1 DD.   Family history of anesthesia complication    daughter N/V   Fibromyalgia    GERD (gastroesophageal reflux disease)    on meds   Glaucoma    Heart murmur    History of  hiatal hernia    Hx of echocardiogram    Echo (03/2013):  Tech limited; Mild focal basal septal hypertrophy, EF 60-65%, normal RVF   Hyperlipidemia    Hypertension    Nonsustained ventricular tachycardia (HCC)    OSA on CPAP    Premature atrial contractions    PVC (premature ventricular contraction)    a. Holter 12/16: NSR, occ PAC,PVCs   Sarcoidosis    Type II diabetes mellitus ([place])     Past Surgical History:  Procedure Laterality Date   BREAST SURGERY     CARDIAC CATHETERIZATION  05/04/2007   reveals overall normal left ventricular systolic function. Ejection fraction 65-70%   CATARACT EXTRACTION W/PHACO Right 07/20/2013   Procedure: CATARACT EXTRACTION PHACO AND INTRAOCULAR LENS PLACEMENT (IOC);  Surgeon: Marylynn Pearson, MD;  Location: [facility];  Service: Ophthalmology;  Laterality: Right;   COLONOSCOPY W/ BIOPSIES AND POLYPECTOMY     COLONOSCOPY WITH PROPOFOL N/A 09/07/2014   Procedure: COLONOSCOPY WITH PROPOFOL;  Surgeon: Juanita Craver, MD;  [facility];  Service: Endoscopy;  Laterality: N/A;   COLONOSCOPY WITH PROPOFOL N/A 01/10/2020   Procedure: COLONOSCOPY WITH PROPOFOL;  Surgeon: Juanita Craver, MD;  [facility];  Service: Endoscopy;  Laterality: N/A;   CORONARY ANGIOPLASTY WITH STENT PLACEMENT  01/15/2017   CORONARY ATHERECTOMY N/A 01/15/2017   Procedure: CORONARY ATHERECTOMY;  Surgeon: Nelva Bush, MD;  Location: [facility];  Service: Cardiovascular;  Laterality: N/A;   CORONARY BALLOON ANGIOPLASTY N/A 01/15/2017   Procedure: CORONARY BALLOON ANGIOPLASTY;  Surgeon: Nelva Bush, MD;  Location: [facility];  Service: Cardiovascular;  Laterality: N/A;   CORONARY STENT INTERVENTION N/A 01/15/2017   Procedure: CORONARY STENT INTERVENTION;  Surgeon: Nelva Bush, MD;  Location: [facility];  Service: Cardiovascular;  Laterality: N/A;   ESOPHAGOGASTRODUODENOSCOPY (EGD) WITH PROPOFOL N/A 09/07/2014   Procedure: ESOPHAGOGASTRODUODENOSCOPY  (EGD) WITH PROPOFOL;  Surgeon: Juanita Craver, MD;  [facility];  Service: Endoscopy;  Laterality: N/A;   EXTERNAL EAR SURGERY Bilateral 1970s   tumors removed   EYE SURGERY Left 2019   cataract extraction    EYE SURGERY Right 02/09/2018   eyelid surgery    INTRAVASCULAR PRESSURE WIRE/FFR STUDY N/A 12/12/2016   Procedure: INTRAVASCULAR PRESSURE WIRE/FFR STUDY;  Surgeon: Nelva Bush, MD;  Location: [facility];  Service: Cardiovascular;  Laterality: N/A;   MINI SHUNT INSERTION Right 07/20/2013   Procedure: INSERTION OF GLAUCOMA FILTRATION DEVICE RIGHT EYE;  Surgeon: Marylynn Pearson, MD;  Location: [facility];  Service: Ophthalmology;  Laterality: Right;   MITOMYCIN C APPLICATION Right 3/53/6144   Procedure: MITOMYCIN C APPLICATION;  Surgeon: Marylynn Pearson, MD;  Location: [facility];  Service: Ophthalmology;  Laterality: Right;   MITOMYCIN C APPLICATION Right 04/25/4006   Procedure: MITOMYCIN C APPLICATION RIGHT EYE;  Surgeon: Marylynn Pearson, MD;  Location: [facility];  Service: Ophthalmology;  Laterality: Right;   PLACEMENT OF BREAST IMPLANTS Bilateral 1992   "took all my breast tissue out; put implants in;fibrocystic breast disease "   POLYPECTOMY  01/10/2020   Procedure: POLYPECTOMY;  Surgeon: Juanita Craver, MD;  [facility];  Service: Endoscopy;;   RIGHT/LEFT HEART CATH AND CORONARY ANGIOGRAPHY N/A 12/12/2016   Procedure: RIGHT/LEFT HEART CATH AND CORONARY ANGIOGRAPHY;  Surgeon: Nelva Bush, MD;  Location: [facility];  Service: Cardiovascular;  Laterality: N/A;   TONSILLECTOMY     TOTAL ABDOMINAL HYSTERECTOMY  1982   TRABECULECTOMY Right 02/21/2015   Procedure: TRABECULECTOMY WITH [facility] ON THE RIGHT EYE;  Surgeon: Marylynn Pearson, MD;  Location: [facility];  Service: Ophthalmology;  Laterality: Right;     Current Outpatient Medications  Medication Sig Dispense Refill   acetaminophen (TYLENOL) 500 MG tablet Take 1,000 mg by mouth 2 (two) times daily as needed for moderate pain  or headache.     albuterol (PROVENTIL) (2.5 MG/3ML) 0.083% nebulizer solution Take 3 mLs (2.5 mg total) by nebulization daily as needed for wheezing or shortness of breath. 75 mL 12   albuterol (VENTOLIN HFA) 108 (90 Base) MCG/ACT inhaler Inhale 2 puffs into the lungs every 6 (six) hours as needed for wheezing or shortness of breath. 18 g 12   ALPRAZolam (XANAX) 0.5 MG tablet For sedation before MRI scan; take 1 tab 1 hour before scan; may repeat 1 tab 15 min before scan 2 tablet 0   aspirin EC 81 MG tablet Take 81 mg by mouth daily. Swallow whole.     atorvastatin (LIPITOR) 80 MG tablet TAKE 1 TABLET(80 MG) BY MOUTH DAILY 90 tablet 1   Blood Glucose Monitoring Suppl (ACCU-CHEK AVIVA PLUS) w/Device KIT Use to check blood sugars 3 times a day. Dx code e11.65 1 kit 3   brimonidine (ALPHAGAN P) 0.1 % SOLN Place 1 drop into both eyes 2 (two) times daily.      brinzolamide (AZOPT) 1 % ophthalmic suspension Place 1 drop into both eyes 3 (three) times daily.     carvedilol (COREG) 12.5 MG tablet TAKE 1 TABLET(12.5 MG) BY MOUTH TWICE DAILY 180 tablet 3   clopidogrel (PLAVIX) 75 MG tablet Take 1 tablet (75 mg total) by mouth daily. 90 tablet 3   furosemide (LASIX) 40 MG tablet Take 1 tablet (40  mg total) by mouth every other day. 90 tablet 1   isosorbide mononitrate (IMDUR) 60 MG 24 hr tablet TAKE 1 TABLET(60 MG) BY MOUTH DAILY 90 tablet 3   JANUMET 50-500 MG tablet TAKE 1 TABLET BY MOUTH TWICE DAILY WITH A MEAL 180 tablet 1   ketorolac (ACULAR) 0.5 % ophthalmic solution Place 1 drop into the right eye 3 (three) times daily.     Lancets (ONETOUCH DELICA PLUS LEXNTZ00F) MISC CHECK BLOOD SUGAR BEFORE BREAKFAST AND DINNER 100 each 1   Misc Natural Products (FOCUSED MIND PO) Take 4 tablets by mouth daily. For memory     montelukast (SINGULAIR) 10 MG tablet TAKE 1 TABLET(10 MG) BY MOUTH EVERY EVENING 90 tablet 1   Nebulizers (COMPRESSOR/NEBULIZER) MISC Use as directed 1 each 0   pantoprazole (PROTONIX) 40 MG  tablet TAKE 1 TABLET(40 MG) BY MOUTH DAILY 30 tablet 11   pantoprazole (PROTONIX) 40 MG tablet Take 40 mg by mouth daily.     Polyvinyl Alcohol-Povidone PF (REFRESH) 1.4-0.6 % SOLN Place 1-2 drops into both eyes 3 (three) times daily as needed (dry/irritated eyes.).     potassium chloride SA (KLOR-CON) 20 MEQ tablet Take 1 tablet (20 mEq total) by mouth every other day. 90 tablet 1   prednisoLONE acetate (PRED FORTE) 1 % ophthalmic suspension Place 1 drop into both eyes 4 (four) times daily.      pregabalin (LYRICA) 75 MG capsule TAKE 1 CAPSULE(75 MG) BY MOUTH TWICE DAILY 180 capsule 1   Travoprost, BAK Free, (TRAVATAN) 0.004 % SOLN ophthalmic solution Place 1 drop into the right eye at bedtime.      umeclidinium-vilanterol (ANORO ELLIPTA) 62.5-25 MCG/INH AEPB Inhale 1 puff into the lungs daily. 14 each 0   metoprolol tartrate (LOPRESSOR) 25 MG tablet Take 0.5-1 tablets (12.5-25 mg total) by mouth as needed (PALPITATIONS). 90 tablet 2   nitroGLYCERIN (NITROSTAT) 0.4 MG SL tablet Place 1 tablet (0.4 mg total) under the tongue every 5 (five) minutes as needed for chest pain. 25 tablet prn   Current Facility-Administered Medications  Medication Dose Route Frequency Provider Last Rate Last Admin   0.9 %  sodium chloride infusion   Intravenous PRN Eileen Stanford, PA-C        Allergies:   Crestor [rosuvastatin calcium], Demerol  [meperidine hcl], and Shellfish allergy    Social History:  The patient  reports that she quit smoking about 40 years ago. Her smoking use included cigarettes. She has a 4.00 pack-year smoking history. She has never used smokeless tobacco. She reports that she does not drink alcohol and does not use drugs.   Family History:  The patient's family history includes Cancer in her mother and paternal grandmother; Diabetes in her brother and father; Glaucoma in her brother and father; Heart disease in her father and paternal grandmother.    ROS: Noted in current history,  otherwise review of systems is negative.   Physical Exam: Blood pressure 138/72, pulse 85, height $RemoveBe'5\' 2"'vpHjdQanX$  (1.575 m), weight 172 lb 12.8 oz (78.4 kg), SpO2 99 %.  GEN:  Well nourished, well developed in no acute distress HEENT: Normal NECK: No JVD; No carotid bruits LYMPHATICS: No lymphadenopathy CARDIAC: RRR , no murmurs, rubs, gallops RESPIRATORY:  Clear to auscultation without rales, wheezing or rhonchi  ABDOMEN: Soft, non-tender, non-distended MUSCULOSKELETAL:  No edema; No deformity  SKIN: Warm and dry NEUROLOGIC:  Alert and oriented x 3   EKG:   August 23, 2020: Normal sinus rhythm at 85.  No ST or T wave changes.   Recent Labs: 11/28/2019: BNP 19.2; Magnesium 1.7 04/03/2020: TSH 0.943 08/01/2020: ALT 13; BUN 12; Creatinine, Ser 0.81; Hemoglobin 11.3; Platelets 179; Potassium 4.6; Sodium 139    Lipid Panel    Component Value Date/Time   CHOL 152 04/03/2020 1337   TRIG 104 04/03/2020 1337   HDL 44 04/03/2020 1337   CHOLHDL 3.5 04/03/2020 1337   CHOLHDL 3 05/24/2014 1650   VLDL 20.8 05/24/2014 1650   LDLCALC 89 04/03/2020 1337      Wt Readings from Last 3 Encounters:  08/23/20 172 lb 12.8 oz (78.4 kg)  08/15/20 174 lb (78.9 kg)  08/01/20 173 lb (78.5 kg)      Other studies Reviewed: Additional studies/ records that were reviewed today include: . Review of the above records demonstrates:    ASSESSMENT AND PLAN:   1.   Coronary artery disease:    She denies any angina.  I encouraged her to exercise.    2. Hyperlipidemia -her last LDL is 89.  She is on atorvastatin 80 mg a day.  She did not tolerate rosuvastatin the last time we tried.  We will add Zetia 10 mg a day.  We will check lipids and ALT in 3 months.  4. Nonsustained ventricular tachycardia-   no further episodes of NSVT     5.  Sarcoidosis -  per pulmonary    6.  Leg edema : She has bilateral lymphedema.  She occasionally gets her legs wrapped.    Current medicines are reviewed at length  with the patient today.  The patient does not have concerns regarding medicines.  The following changes have been made:  no change  Labs/ tests ordered today include:   No orders of the defined types were placed in this encounter.  Disposition: She appears to be very stable.  We will have her transfer to Dr. Johney Frame.  She will see see Dr. Johney Frame in 1 year.   Signed, Mertie Moores, MD  08/23/2020 12:01 PM    [facility] [address], [place], [place]  [address] Phone: [phone]; Fax: [phone]

## 2020-08-17 ENCOUNTER — Encounter (HOSPITAL_COMMUNITY): Payer: Medicare Other | Admitting: Physical Therapy

## 2020-08-19 DIAGNOSIS — Z20822 Contact with and (suspected) exposure to covid-19: Secondary | ICD-10-CM | POA: Diagnosis not present

## 2020-08-20 ENCOUNTER — Encounter (HOSPITAL_COMMUNITY): Payer: Medicare Other | Admitting: Physical Therapy

## 2020-08-21 ENCOUNTER — Other Ambulatory Visit: Payer: Self-pay | Admitting: *Deleted

## 2020-08-21 MED ORDER — PANTOPRAZOLE SODIUM 40 MG PO TBEC: DELAYED_RELEASE_TABLET | ORAL | 11 refills | Status: DC

## 2020-08-22 ENCOUNTER — Encounter (HOSPITAL_COMMUNITY): Payer: Medicare Other | Admitting: Physical Therapy

## 2020-08-23 ENCOUNTER — Encounter: Payer: Self-pay | Admitting: Cardiovascular Disease

## 2020-08-23 ENCOUNTER — Ambulatory Visit (INDEPENDENT_AMBULATORY_CARE_PROVIDER_SITE_OTHER): Payer: Medicare Other | Admitting: Cardiovascular Disease

## 2020-08-23 ENCOUNTER — Other Ambulatory Visit: Payer: Self-pay

## 2020-08-23 VITALS — BP 138/72 | HR 85 | Ht 62.0 in | Wt 172.8 lb

## 2020-08-23 DIAGNOSIS — I25119 Atherosclerotic heart disease of native coronary artery with unspecified angina pectoris: Secondary | ICD-10-CM | POA: Diagnosis not present

## 2020-08-23 DIAGNOSIS — I25118 Atherosclerotic heart disease of native coronary artery with other forms of angina pectoris: Secondary | ICD-10-CM

## 2020-08-23 DIAGNOSIS — E7800 Pure hypercholesterolemia, unspecified: Secondary | ICD-10-CM

## 2020-08-23 MED ORDER — EZETIMIBE 10 MG PO TABS: 10.0000 mg | ORAL_TABLET | Freq: Every day | ORAL | 3 refills | Status: DC

## 2020-08-23 NOTE — Patient Instructions (Signed)
Medication Instructions:  Your physician has recommended you make the following change in your medication:  START Zetia (ezetimibe) 10 mg once daily  *If you need a refill on your cardiac medications before your next appointment, please call your pharmacy*   Lab Work: Your physician recommends that you return for lab work in: 3 months on Tuesday Oct. 18  You may come in anytime between 7:30 am and 4:45 pm You will need to FAST for this appointment - nothing to eat or drink after midnight the night before except water.  If you have labs (blood work) drawn today and your tests are completely normal, you will receive your results only by: [facility] (if you have MyChart) OR A paper copy in the mail If you have any lab test that is abnormal or we need to change your treatment, we will call you to review the results.   Testing/Procedures: None Ordered   Follow-Up: At [facility], you and your health needs are our priority.  As part of our continuing mission to provide you with exceptional heart care, we have created designated Provider Care Teams.  These Care Teams include your primary Cardiologist (physician) and Advanced Practice Providers (APPs -  Physician Assistants and Nurse Practitioners) who all work together to provide you with the care you need, when you need it.   Your next appointment:   1 year(s)  The format for your next appointment:   In Person  Provider:   Gwyndolyn Kaufman, MD Christen Bame, NP

## 2020-08-24 ENCOUNTER — Ambulatory Visit: Payer: Medicare Other | Admitting: Cardiovascular Disease

## 2020-08-24 ENCOUNTER — Encounter (HOSPITAL_COMMUNITY): Payer: Medicare Other | Admitting: Physical Therapy

## 2020-08-27 ENCOUNTER — Encounter (HOSPITAL_COMMUNITY): Payer: Medicare Other | Admitting: Physical Therapy

## 2020-08-28 ENCOUNTER — Telehealth: Payer: Self-pay

## 2020-08-28 ENCOUNTER — Other Ambulatory Visit: Payer: Self-pay | Admitting: Internal Medicine

## 2020-08-28 NOTE — Telephone Encounter (Signed)
  Care Management   Follow Up Note   08/28/2020 Name: Sheila Reynolds MRN: [id number] DOB: 05-Apr-1945   Referred by: Glendale Chard, MD Reason for referral : Chronic Care Management (RNCM follow up )   Printed and mailed educational material related to Low Carb Smoothies for Diabetes; Mediterranean Diet as discussed during 08/07/20 encounter with patient.  Follow Up Plan: Telephone follow up appointment with care management team member scheduled for: 11/08/20  Barb Merino, RN, BSN, CCM Care Management Coordinator [facility]  Direct Phone: [phone]

## 2020-08-29 ENCOUNTER — Encounter (HOSPITAL_COMMUNITY): Payer: Medicare Other | Admitting: Physical Therapy

## 2020-08-29 DIAGNOSIS — R350 Frequency of micturition: Secondary | ICD-10-CM | POA: Diagnosis not present

## 2020-08-29 DIAGNOSIS — R351 Nocturia: Secondary | ICD-10-CM | POA: Diagnosis not present

## 2020-08-29 DIAGNOSIS — N3946 Mixed incontinence: Secondary | ICD-10-CM | POA: Diagnosis not present

## 2020-08-30 ENCOUNTER — Telehealth (HOSPITAL_COMMUNITY): Payer: Self-pay

## 2020-08-30 NOTE — Telephone Encounter (Signed)
Called patient to see if she was interested in participating in the Pulmonary Rehab Program. Patient stated yes. Patient will come in for orientation on 10/08/20 @ 9AM and will attend the 10:15AM exercise class.   Tourist information centre manager.

## 2020-08-31 ENCOUNTER — Encounter (HOSPITAL_COMMUNITY): Payer: Medicare Other | Admitting: Physical Therapy

## 2020-09-02 ENCOUNTER — Other Ambulatory Visit: Payer: Self-pay | Admitting: Internal Medicine

## 2020-09-02 MED ORDER — PREGABALIN 75 MG PO CAPS: ORAL_CAPSULE | ORAL | 1 refills | Status: DC

## 2020-09-03 ENCOUNTER — Encounter (HOSPITAL_COMMUNITY): Payer: Medicare Other | Admitting: Physical Therapy

## 2020-09-03 ENCOUNTER — Telehealth: Payer: Self-pay

## 2020-09-03 ENCOUNTER — Ambulatory Visit
Admission: EM | Admit: 2020-09-03 | Discharge: 2020-09-03 | Discharge status: Against Medical Advice | Payer: Medicare Other

## 2020-09-03 ENCOUNTER — Other Ambulatory Visit: Payer: Self-pay

## 2020-09-03 ENCOUNTER — Ambulatory Visit: Payer: Medicare Other | Admitting: Nurse Practitioner

## 2020-09-03 ENCOUNTER — Emergency Department (HOSPITAL_BASED_OUTPATIENT_CLINIC_OR_DEPARTMENT_OTHER)
Admission: EM | Admit: 2020-09-03 | Discharge: 2020-09-03 | Disposition: A | Discharge status: Home / Self Care | Payer: Medicare Other | Attending: Emergency Medicine | Admitting: Emergency Medicine

## 2020-09-03 ENCOUNTER — Encounter (HOSPITAL_BASED_OUTPATIENT_CLINIC_OR_DEPARTMENT_OTHER): Payer: Self-pay | Admitting: *Deleted

## 2020-09-03 DIAGNOSIS — J45909 Unspecified asthma, uncomplicated: Secondary | ICD-10-CM | POA: Insufficient documentation

## 2020-09-03 DIAGNOSIS — Z87891 Personal history of nicotine dependence: Secondary | ICD-10-CM | POA: Insufficient documentation

## 2020-09-03 DIAGNOSIS — I10 Essential (primary) hypertension: Secondary | ICD-10-CM | POA: Insufficient documentation

## 2020-09-03 DIAGNOSIS — Z79899 Other long term (current) drug therapy: Secondary | ICD-10-CM | POA: Insufficient documentation

## 2020-09-03 DIAGNOSIS — L509 Urticaria, unspecified: Secondary | ICD-10-CM | POA: Insufficient documentation

## 2020-09-03 DIAGNOSIS — Z7951 Long term (current) use of inhaled steroids: Secondary | ICD-10-CM | POA: Diagnosis not present

## 2020-09-03 DIAGNOSIS — Z7982 Long term (current) use of aspirin: Secondary | ICD-10-CM | POA: Insufficient documentation

## 2020-09-03 DIAGNOSIS — E119 Type 2 diabetes mellitus without complications: Secondary | ICD-10-CM | POA: Diagnosis not present

## 2020-09-03 DIAGNOSIS — I2510 Atherosclerotic heart disease of native coronary artery without angina pectoris: Secondary | ICD-10-CM | POA: Insufficient documentation

## 2020-09-03 DIAGNOSIS — R21 Rash and other nonspecific skin eruption: Secondary | ICD-10-CM | POA: Diagnosis present

## 2020-09-03 DIAGNOSIS — Z7984 Long term (current) use of oral hypoglycemic drugs: Secondary | ICD-10-CM | POA: Insufficient documentation

## 2020-09-03 DIAGNOSIS — Z7902 Long term (current) use of antithrombotics/antiplatelets: Secondary | ICD-10-CM | POA: Diagnosis not present

## 2020-09-03 MED ORDER — PREDNISONE 20 MG PO TABS: 40.0000 mg | ORAL_TABLET | Freq: Every day | ORAL | 0 refills | Status: AC

## 2020-09-03 MED ORDER — PREDNISONE 20 MG PO TABS
40.0000 mg | ORAL_TABLET | Freq: Once | ORAL | Status: AC
  Administered 2020-09-03: 40 mg via ORAL
  Filled 2020-09-03: qty 2

## 2020-09-03 NOTE — Progress Notes (Signed)
[facility] [address] [place]. [place] [address] [phone]       OFFICE FOLLOW UP NOTE  [name] Date of Birth:  [birth year] Medical Record Number:  [id number]   Referring MD:  Glendale Chard  Reason for Referral:  stroke  Chief Complaint  Patient presents with   Headache    Rm 3, early FU, husband- Gerald Stabs  "since end of April- headache almost every day across top of and through left eye and near my ear; taking Tylenol and prednisone eye drops, have seen 2 eye drs; pain now 4.5/10, has been as high as 9/10"     HPI:   Today, 09/03/2020, Sheila Reynolds returns for sooner scheduled visit for headaches. She reports being seen by her ophthalmologist who advised her to follow-up with our office for further evaluation.  Extensive work-up completed after prior visit in 04/2020 including MR brain, MR cervical, EEG and lab work which was all unremarkable.  Reports over the past 3 months, she has been experiencing left orbital and periorbital headaches.  Initially experiencing them daily but have since been gradually improving.  She may experience dull headache daily but at times can be severe throbbing headache accompanied by blurred vision but denies actual loss of vision.  She was evaluated by her ophthalmologist with history of open-angle glaucoma bilaterally with recent exam normal IOP bilaterally.  She also has chronic iritis bilaterally with use of prednisone drops which she reports will improve orbital headache.  She also intermittently use Tylenol with benefit.  Denies photophobia, phonophobia or N/V.  She was seen by her PCP who did lab work on 6/27 with a normal CRP and sed rate.    History provided for reference purposes only Update 04/03/2020 Dr. Leonie Man: She returns for follow-up after last visit year and half ago.  She has new complaints of worsening balance, dizziness, memory as well as headaches and neck pain.  She states her balance is poor and she has  had a few falls but no injuries.  She has to be careful and often can catch her self and does not fall.  She denies any significant neck pain radiating down her spine but does complain of tightness in the neck and trouble moving her neck.  She has had no recent neck x-rays or scans done.  She also complains of posterior headaches as well as bifrontal headaches which are intermittent and last only few minutes.  She takes some Tylenol which seems to help.  Cold compression also seems to help her headaches.  She did undergo some carotid ultrasound at last visit after she saw me on 10/29/2018 which showed no significant extracranial stenosis and transcranial Doppler study was also done which showed poor left improvement of but normal velocities in the vessels that could be studied.  On inquiry she admits to numbness in the right hand which is intermittent but more in the night.  She does agree that the numbness seems to more prominent when she performs activities with rapid repetitive wrist flexion.  Initial visit 10/20/2018 Dr. Leonie Man: Sheila Reynolds is a 75 year old African-American lady seen today for initial office consultation visit for stroke.  She is accompanied by her husband today.  History is obtained from them and review of referral notes and have personally reviewed imaging films in PACS.  The patient had been having steady decline in the vision acuity in the right eye and for which she was seen by neuro-ophthalmology recently.  They ordered an  MRI of the brain and orbits which was done on 10/05/2018 which I personally reviewed MRI of the orbits was unremarkable but MRI scan of the brain showed a small left thalamic acute infarct.  Patient had no neurological symptoms at that time and this is likely an incidental finding.  Patient denied any symptoms of right-sided numbness, tingling, weakness gait or balance problems or memory problems.  She thought a few days prior to her MRI she had some twitchings in the left  side of the face but this lasted only a few hours and recovered.  She denied any increased watering of the eyes lack of sensation or weakness.  Patient has no prior history of strokes, TIAs, seizures, migraines or significant neurological problems.  She had lab work on 10/07/2018 which showed LDL cholesterol of 71 mg percent.  She takes Lipitor 40 mg daily.  She had a hemoglobin A1c of 6.1 on 07/20/2018.  Her blood pressure is usually well controlled and today it is 134/77.  Her fasting sugars have all been good in the 110 range.  She also has sleep apnea and is quite compliant with her CPAP and takes it every night.  She has not had any vascular imaging studies done for the brain of the neck.  She has no neurological complaints today.  ROS:   14 system review of systems is positive for those listed in HPI and all other systems negative e and all other systems negative  PMH:  Past Medical History:  Diagnosis Date   Anemia    3 months ago anemic   Anxiety    on meds   Arthritis    "all over" (01/15/2017)   Asthma    Bronchitis with emphysema    Chest pain    Chronic bronchitis ([place])    Coronary artery disease    a. 01/2017 she underwent orbital atherectomy/DES to the proxmal LAD and PTCA to ostial D2. 2D Echo 01/15/17 showed mild LVH, EF 60-65%, grade 1 DD.   Family history of anesthesia complication    daughter N/V   Fibromyalgia    GERD (gastroesophageal reflux disease)    on meds   Glaucoma    Heart murmur    History of hiatal hernia    Hx of echocardiogram    Echo (03/2013):  Tech limited; Mild focal basal septal hypertrophy, EF 60-65%, normal RVF   Hyperlipidemia    Hypertension    Nonsustained ventricular tachycardia (HCC)    OSA on CPAP    Premature atrial contractions    PVC (premature ventricular contraction)    a. Holter 12/16: NSR, occ PAC,PVCs   Sarcoidosis    Type II diabetes mellitus ([place])     Social History:  Social History   Socioeconomic History   Marital  status: Married    Spouse name: Gerald Stabs   Number of children: 2   Years of education: Not on file   Highest education level: Not on file  Occupational History   Occupation: unemployed    Employer: [employer]  Tobacco Use   Smoking status: Former    Packs/day: 1.00    Years: 4.00    Pack years: 4.00    Types: Cigarettes    Quit date: 02/11/1980    Years since quitting: 40.5   Smokeless tobacco: Never  Vaping Use   Vaping Use: Never used  Substance and Sexual Activity   Alcohol use: No   Drug use: No   Sexual activity: Yes  Other  Topics Concern   Not on file  Social History Narrative   Lives with husband   Right hand   Drinks 2-3 cups caffeine daily   Social Determinants of Health   Financial Resource Strain: Medium Risk   Difficulty of Paying Living Expenses: Somewhat hard  Food Insecurity: No Food Insecurity   Worried About Running Out of Food in the Last Year: Never true   Ran Out of Food in the Last Year: Never true  Transportation Needs: No Transportation Needs   Lack of Transportation (Medical): No   Lack of Transportation (Non-Medical): No  Physical Activity: Inactive   Days of Exercise per Week: 0 days   Minutes of Exercise per Session: 0 min  Stress: Stress Concern Present   Feeling of Stress : To some extent  Social Connections: Not on file  Intimate Partner Violence: Not on file    Medications:   Current Outpatient Medications on File Prior to Visit  Medication Sig Dispense Refill   acetaminophen (TYLENOL) 500 MG tablet Take 1,000 mg by mouth 2 (two) times daily as needed for moderate pain or headache.     albuterol (PROVENTIL) (2.5 MG/3ML) 0.083% nebulizer solution Take 3 mLs (2.5 mg total) by nebulization daily as needed for wheezing or shortness of breath. 75 mL 12   albuterol (VENTOLIN HFA) 108 (90 Base) MCG/ACT inhaler Inhale 2 puffs into the lungs every 6 (six) hours as needed for wheezing or shortness of breath. 18 g 12   aspirin EC  81 MG tablet Take 81 mg by mouth daily. Swallow whole.     atorvastatin (LIPITOR) 80 MG tablet TAKE 1 TABLET(80 MG) BY MOUTH DAILY 90 tablet 1   Blood Glucose Monitoring Suppl (ACCU-CHEK AVIVA PLUS) w/Device KIT Use to check blood sugars 3 times a day. Dx code e11.65 1 kit 3   brimonidine (ALPHAGAN P) 0.1 % SOLN Place 1 drop into both eyes 2 (two) times daily.      brinzolamide (AZOPT) 1 % ophthalmic suspension Place 1 drop into both eyes 3 (three) times daily.     carvedilol (COREG) 12.5 MG tablet TAKE 1 TABLET(12.5 MG) BY MOUTH TWICE DAILY 180 tablet 3   clopidogrel (PLAVIX) 75 MG tablet Take 1 tablet (75 mg total) by mouth daily. 90 tablet 3   ezetimibe (ZETIA) 10 MG tablet Take 1 tablet (10 mg total) by mouth daily. 90 tablet 3   furosemide (LASIX) 40 MG tablet Take 1 tablet (40 mg total) by mouth every other day. 90 tablet 1   isosorbide mononitrate (IMDUR) 60 MG 24 hr tablet TAKE 1 TABLET(60 MG) BY MOUTH DAILY 90 tablet 3   JANUMET 50-500 MG tablet TAKE 1 TABLET BY MOUTH TWICE DAILY WITH A MEAL 180 tablet 1   ketorolac (ACULAR) 0.5 % ophthalmic solution Place 1 drop into the right eye 3 (three) times daily.     Lancets (ONETOUCH DELICA PLUS RTMYTR17B) MISC CHECK BLOOD SUGAR BEFORE BREAKFAST AND DINNER 100 each 1   montelukast (SINGULAIR) 10 MG tablet TAKE 1 TABLET(10 MG) BY MOUTH EVERY EVENING 90 tablet 1   Nebulizers (COMPRESSOR/NEBULIZER) MISC Use as directed 1 each 0   pantoprazole (PROTONIX) 40 MG tablet Take 40 mg by mouth daily.     Polyvinyl Alcohol-Povidone PF (REFRESH) 1.4-0.6 % SOLN Place 1-2 drops into both eyes 3 (three) times daily as needed (dry/irritated eyes.).     potassium chloride SA (KLOR-CON) 20 MEQ tablet Take 1 tablet (20 mEq total) by mouth every other day.  90 tablet 1   prednisoLONE acetate (PRED FORTE) 1 % ophthalmic suspension Place 1 drop into both eyes 4 (four) times daily.      predniSONE (DELTASONE) 20 MG tablet Take 2 tablets (40 mg total) by mouth daily for 4  days. 8 tablet 0   pregabalin (LYRICA) 75 MG capsule TAKE 1 CAPSULE(75 MG) BY MOUTH TWICE DAILY 180 capsule 1   Travoprost, BAK Free, (TRAVATAN) 0.004 % SOLN ophthalmic solution Place 1 drop into the right eye at bedtime.      umeclidinium-vilanterol (ANORO ELLIPTA) 62.5-25 MCG/INH AEPB Inhale 1 puff into the lungs daily. 14 each 0   nitroGLYCERIN (NITROSTAT) 0.4 MG SL tablet Place 1 tablet (0.4 mg total) under the tongue every 5 (five) minutes as needed for chest pain. 25 tablet prn   Current Facility-Administered Medications on File Prior to Visit  Medication Dose Route Frequency Provider Last Rate Last Admin   0.9 %  sodium chloride infusion   Intravenous PRN Eileen Stanford, PA-C        Allergies:   Allergies  Allergen Reactions   Crestor [Rosuvastatin Calcium] Other (See Comments)    muscle aches   Demerol  [Meperidine Hcl]     Other reaction(s): Hallucinations   Shellfish Allergy Itching and Other (See Comments)    Crab, shrimp and lobster ---lips itch and tingle Was told not to eat again after having a allergy test. Lobster, crab and shrimp     Physical Exam Today's Vitals   09/04/20 0827  BP: 117/67  Pulse: 92  Weight: 175 lb 9.6 oz (79.7 kg)  Height: $Remove'5\' 2"'NvKrMhL$  (1.575 m)   Body mass index is 32.12 kg/m.   General: well developed, well nourished elderly African-American lady, seated, in no evident distress Head: head normocephalic and atraumatic.   Neck: supple with no carotid or supraclavicular bruits Cardiovascular: regular rate and rhythm, no murmurs Musculoskeletal: no deformity Skin:  no rash/petichiae Vascular:  Normal pulses all extremities  Neurologic Exam Mental Status: Awake and fully alert.  Fluent speech and language.  Oriented to place and time. Recent and remote memory intact. Attention span, concentration and fund of knowledge appropriate. Mood and affect appropriate.  MMSE not completed today. Cranial Nerves: Pupils equal, briskly reactive to light.  Extraocular movements full without nystagmus. Visual fields full to confrontation.  Diminished vision acuity in the right eye.  Hearing slightly diminished bilaterally. Facial sensation intact. Face, tongue, palate moves normally and symmetrically.  Motor: Normal bulk and tone. Normal strength in all tested extremity muscles. Sensory.: intact to touch , pinprick , position and vibratory sensation.  Tinel's sign positive over the right wrist. Coordination: Rapid alternating movements normal in all extremities. Finger-to-nose and heel-to-shin performed accurately bilaterally. Gait and Station: Arises from chair without difficulty. Stance is normal. Gait demonstrates normal stride length and balance . Able to heel, toe and tandem walk with slight difficulty.  Reflexes: 1+ and symmetric. Toes downgoing.      ASSESSMENT/PLAN: 75 year old African-American lady with asymptomatic left subcortical lacunar infarct in August 2020 discovered at the time of brain imaging for vision difficulties which is unrelated and likely from glaucoma and eye problem.  Vascular risk factors of hypertension, hyperlipidemia, diabetes and sleep apnea.  Prior visit - new complaints of worsening balance, right hand numbness, memory loss, headaches -extensive evaluation unremarkable but continues to experience frontal and left orbital headaches   MR brain -chronic left thalamic lacunar infarct -negative for acute findings MR cervical - stable, mild disc bulging  at C3-4 down to C6-7 EEG unremarkable Lab work WNL (sed rate, dementia panel, lipid panel and A1c)   -Fully reviewed and discussed prior work-up with patient who was reassured and no concerning findings found.  Further discussed with Dr. Leonie Man who does not feel additional work-up warranted for left orbital headaches at this time as recent work-up completed as well as full ophthalmology evaluation and recent sed rate and CRP WNL (6/27) -Recommend trialing topiramate 25 mg  nightly for 7 days then increase to 25 mg twice daily -discussed potential side effects and to call with any difficulty tolerating or possible need of dosage increase -Continue aspirin, Plavix and atorvastatin for secondary stroke prevention and per cards recommendations -maintain aggressive risk factor modification strict control of hypertension with blood pressure goal below 130/90, lipids with LDL cholesterol goal below 70 mg percent and diabetes with hemoglobin A1c goal below 7%.     Follow-up with Dr. Leonie Man as scheduled on 11/15/2020    CC:  [facility] provider: Dr. Henriette Combs, Bailey Mech, MD   I spent 36 minutes of face-to-face and non-face-to-face time with patient and husband.  This included previsit chart review, lab review, study review, order entry, electronic health record documentation, patient education and discussion regarding complaints of left orbital headache and possible etiologies, further treatment options and recommendations, history of prior stroke as well as secondary stroke prevention measures and aggressive stroke risk factor management and answered all other questions and patient and husband satisfaction  Frann Rider, AGNP-BC  [facility] [address] [place], [place] [address]  Phone [phone] Fax [phone] Note: This document was prepared with digital dictation and possible smart phrase technology. Any transcriptional errors that result from this process are unintentional.

## 2020-09-03 NOTE — ED Provider Notes (Signed)
[facility] Provider Note  CSN: [id number] Arrival date & time: 09/03/20 1239    History Chief Complaint  Patient presents with   Allergic Reaction    Sheila Reynolds is a 75 y.o. female reports she has had an itchy rash for the last several days since she was bitten by mosquitos and an ant on her patio last week. She reports it started on her L arm and has since spread to her entire body except for her face. She has been taking oral benadryl without improvement. She denies new soaps, lotions, detergents, medications, etc. No tongue/lip swelling, no shortness of breath or throat closing.    Past Medical History:  Diagnosis Date   Anemia    3 months ago anemic   Anxiety    on meds   Arthritis    "all over" (01/15/2017)   Asthma    Bronchitis with emphysema    Chest pain    Chronic bronchitis ([place])    Coronary artery disease    a. 01/2017 she underwent orbital atherectomy/DES to the proxmal LAD and PTCA to ostial D2. 2D Echo 01/15/17 showed mild LVH, EF 60-65%, grade 1 DD.   Family history of anesthesia complication    daughter N/V   Fibromyalgia    GERD (gastroesophageal reflux disease)    on meds   Glaucoma    Heart murmur    History of hiatal hernia    Hx of echocardiogram    Echo (03/2013):  Tech limited; Mild focal basal septal hypertrophy, EF 60-65%, normal RVF   Hyperlipidemia    Hypertension    Nonsustained ventricular tachycardia (HCC)    OSA on CPAP    Premature atrial contractions    PVC (premature ventricular contraction)    a. Holter 12/16: NSR, occ PAC,PVCs   Sarcoidosis    Type II diabetes mellitus ([place])     Past Surgical History:  Procedure Laterality Date   BREAST SURGERY     CARDIAC CATHETERIZATION  05/04/2007   reveals overall normal left ventricular systolic function. Ejection fraction 65-70%   CATARACT EXTRACTION W/PHACO Right 07/20/2013   Procedure: CATARACT EXTRACTION PHACO AND INTRAOCULAR LENS PLACEMENT  (IOC);  Surgeon: Marylynn Pearson, MD;  Location: [place];  Service: Ophthalmology;  Laterality: Right;   COLONOSCOPY W/ BIOPSIES AND POLYPECTOMY     COLONOSCOPY WITH PROPOFOL N/A 09/07/2014   Procedure: COLONOSCOPY WITH PROPOFOL;  Surgeon: Juanita Craver, MD;  Location: [facility];  Service: Endoscopy;  Laterality: N/A;   COLONOSCOPY WITH PROPOFOL N/A 01/10/2020   Procedure: COLONOSCOPY WITH PROPOFOL;  Surgeon: Juanita Craver, MD;  [facility];  Service: Endoscopy;  Laterality: N/A;   CORONARY ANGIOPLASTY WITH STENT PLACEMENT  01/15/2017   CORONARY ATHERECTOMY N/A 01/15/2017   Procedure: CORONARY ATHERECTOMY;  Surgeon: Nelva Bush, MD;  Location: [place] [facility];  Service: Cardiovascular;  Laterality: N/A;   CORONARY BALLOON ANGIOPLASTY N/A 01/15/2017   Procedure: CORONARY BALLOON ANGIOPLASTY;  Surgeon: Nelva Bush, MD;  Location: [place] [facility];  Service: Cardiovascular;  Laterality: N/A;   CORONARY STENT INTERVENTION N/A 01/15/2017   Procedure: CORONARY STENT INTERVENTION;  Surgeon: Nelva Bush, MD;  Location: [place] [facility];  Service: Cardiovascular;  Laterality: N/A;   ESOPHAGOGASTRODUODENOSCOPY (EGD) WITH PROPOFOL N/A 09/07/2014   Procedure: ESOPHAGOGASTRODUODENOSCOPY (EGD) WITH PROPOFOL;  Surgeon: Juanita Craver, MD;  [facility];  Service: Endoscopy;  Laterality: N/A;   EXTERNAL EAR SURGERY Bilateral 1970s   tumors removed   EYE SURGERY Left 2019  cataract extraction    EYE SURGERY Right 02/09/2018   eyelid surgery    INTRAVASCULAR PRESSURE WIRE/FFR STUDY N/A 12/12/2016   Procedure: INTRAVASCULAR PRESSURE WIRE/FFR STUDY;  Surgeon: Nelva Bush, MD;  Location: [place] [facility];  Service: Cardiovascular;  Laterality: N/A;   MINI SHUNT INSERTION Right 07/20/2013   Procedure: INSERTION OF GLAUCOMA FILTRATION DEVICE RIGHT EYE;  Surgeon: Marylynn Pearson, MD;  Location: [place];  Service: Ophthalmology;  Laterality: Right;   MITOMYCIN C APPLICATION Right  9/32/6712   Procedure: MITOMYCIN C APPLICATION;  Surgeon: Marylynn Pearson, MD;  Location: [place];  Service: Ophthalmology;  Laterality: Right;   MITOMYCIN C APPLICATION Right 4/58/0998   Procedure: MITOMYCIN C APPLICATION RIGHT EYE;  Surgeon: Marylynn Pearson, MD;  Location: [place];  Service: Ophthalmology;  Laterality: Right;   PLACEMENT OF BREAST IMPLANTS Bilateral 1992   "took all my breast tissue out; put implants in;fibrocystic breast disease "   POLYPECTOMY  01/10/2020   Procedure: POLYPECTOMY;  Surgeon: Juanita Craver, MD;  [facility];  Service: Endoscopy;;   RIGHT/LEFT HEART CATH AND CORONARY ANGIOGRAPHY N/A 12/12/2016   Procedure: RIGHT/LEFT HEART CATH AND CORONARY ANGIOGRAPHY;  Surgeon: Nelva Bush, MD;  Location: [place] [facility];  Service: Cardiovascular;  Laterality: N/A;   TONSILLECTOMY     TOTAL ABDOMINAL HYSTERECTOMY  1982   TRABECULECTOMY Right 02/21/2015   Procedure: TRABECULECTOMY WITH [facility] ON THE RIGHT EYE;  Surgeon: Marylynn Pearson, MD;  Location: [place];  Service: Ophthalmology;  Laterality: Right;    Family History  Problem Relation Age of Onset   Heart disease Father    Diabetes Father    Glaucoma Father    Cancer Mother        unknown type, ?lung   Heart disease Paternal Grandmother    Cancer Paternal Grandmother        unknown type   Diabetes Brother    Glaucoma Brother     Social History   Tobacco Use   Smoking status: Former    Packs/day: 1.00    Years: 4.00    Pack years: 4.00    Types: Cigarettes    Quit date: 02/11/1980    Years since quitting: 40.5   Smokeless tobacco: Never  Vaping Use   Vaping Use: Never used  Substance Use Topics   Alcohol use: No   Drug use: No     Home Medications Prior to Admission medications   Medication Sig Start Date End Date Taking? Authorizing Provider  aspirin EC 81 MG tablet Take 81 mg by mouth daily. Swallow whole.   Yes [provider]  atorvastatin (LIPITOR) 80 MG tablet TAKE 1 TABLET(80 MG)  BY MOUTH DAILY 07/23/20  Yes [name], Wonda Cheng, MD  brimonidine (ALPHAGAN P) 0.1 % SOLN Place 1 drop into both eyes 2 (two) times daily.    Yes [provider]  brinzolamide (AZOPT) 1 % ophthalmic suspension Place 1 drop into both eyes 3 (three) times daily.   Yes [provider]  carvedilol (COREG) 12.5 MG tablet TAKE 1 TABLET(12.5 MG) BY MOUTH TWICE DAILY 12/20/19  Yes [name], Wonda Cheng, MD  clopidogrel (PLAVIX) 75 MG tablet Take 1 tablet (75 mg total) by mouth daily. 02/02/20  Yes [name], Wonda Cheng, MD  ezetimibe (ZETIA) 10 MG tablet Take 1 tablet (10 mg total) by mouth daily. 08/23/20  Yes [name], Wonda Cheng, MD  isosorbide mononitrate (IMDUR) 60 MG 24 hr tablet TAKE 1 TABLET(60 MG) BY MOUTH DAILY 12/20/19  Yes [name],  Wonda Cheng, MD  JANUMET 50-500 MG tablet TAKE 1 TABLET BY MOUTH TWICE DAILY WITH A MEAL 03/19/20  Yes Glendale Chard, MD  ketorolac (ACULAR) 0.5 % ophthalmic solution Place 1 drop into the right eye 3 (three) times daily. 08/02/20  Yes [provider]  montelukast (SINGULAIR) 10 MG tablet TAKE 1 TABLET(10 MG) BY MOUTH EVERY EVENING 06/09/19  Yes Glendale Chard, MD  pantoprazole (PROTONIX) 40 MG tablet TAKE 1 TABLET(40 MG) BY MOUTH DAILY 08/21/20  Yes [name], Wonda Cheng, MD  Polyvinyl Alcohol-Povidone PF (REFRESH) 1.4-0.6 % SOLN Place 1-2 drops into both eyes 3 (three) times daily as needed (dry/irritated eyes.).   Yes [provider]  potassium chloride SA (KLOR-CON) 20 MEQ tablet Take 1 tablet (20 mEq total) by mouth every other day. 06/16/19  Yes [name], Wonda Cheng, MD  prednisoLONE acetate (PRED FORTE) 1 % ophthalmic suspension Place 1 drop into both eyes 4 (four) times daily.  09/24/18  Yes [provider]  predniSONE (DELTASONE) 20 MG tablet Take 2 tablets (40 mg total) by mouth daily for 4 days. 09/03/20 09/07/20 Yes Truddie Hidden, MD  pregabalin (LYRICA) 75 MG capsule TAKE 1 CAPSULE(75 MG) BY MOUTH TWICE DAILY 09/02/20  Yes Glendale Chard, MD   Travoprost, BAK Free, (TRAVATAN) 0.004 % SOLN ophthalmic solution Place 1 drop into the right eye at bedtime.  12/29/19  Yes [provider]  acetaminophen (TYLENOL) 500 MG tablet Take 1,000 mg by mouth 2 (two) times daily as needed for moderate pain or headache.    [provider]  albuterol (PROVENTIL) (2.5 MG/3ML) 0.083% nebulizer solution Take 3 mLs (2.5 mg total) by nebulization daily as needed for wheezing or shortness of breath. 04/22/18   Deneise Lever, MD  albuterol (VENTOLIN HFA) 108 (90 Base) MCG/ACT inhaler Inhale 2 puffs into the lungs every 6 (six) hours as needed for wheezing or shortness of breath. 12/30/19   Baird Lyons D, MD  Blood Glucose Monitoring Suppl (ACCU-CHEK AVIVA PLUS) w/Device KIT Use to check blood sugars 3 times a day. Dx code e11.65 01/03/20   Glendale Chard, MD  furosemide (LASIX) 40 MG tablet Take 1 tablet (40 mg total) by mouth every other day. 06/16/19   [name], Wonda Cheng, MD  Lancets [facility] DELICA PLUS FOYDXA12I) MISC CHECK BLOOD SUGAR BEFORE BREAKFAST AND DINNER 05/28/18   Glendale Chard, MD  Misc Natural Products (FOCUSED MIND PO) Take 4 tablets by mouth daily. For memory    [provider]  Nebulizers (COMPRESSOR/NEBULIZER) MISC Use as directed 04/22/18   Deneise Lever, MD  nitroGLYCERIN (NITROSTAT) 0.4 MG SL tablet Place 1 tablet (0.4 mg total) under the tongue every 5 (five) minutes as needed for chest pain. 07/25/19 07/24/20  Glendale Chard, MD  umeclidinium-vilanterol [facility] ELLIPTA) 62.5-25 MCG/INH AEPB Inhale 1 puff into the lungs daily. 05/14/20   Deneise Lever, MD     Allergies    Crestor [rosuvastatin calcium], Demerol  [meperidine hcl], and Shellfish allergy   Review of Systems   Review of Systems A comprehensive review of systems was completed and negative except as noted in HPI.    Physical Exam BP (!) 145/92 (BP Location: Left Arm)   Pulse 79   Temp 97.6 F (36.4 C)   Resp 16   Ht 5' 2"  (1.575 m)   Wt  78.5 kg   SpO2 99%   BMI 31.64 kg/m   Physical Exam Vitals and nursing note reviewed.  Constitutional:  Appearance: Normal appearance.  HENT:     Head: Normocephalic and atraumatic.     Nose: Nose normal.     Mouth/Throat:     Mouth: Mucous membranes are moist.  Eyes:     Extraocular Movements: Extraocular movements intact.     Conjunctiva/sclera: Conjunctivae normal.  Cardiovascular:     Rate and Rhythm: Normal rate.  Pulmonary:     Effort: Pulmonary effort is normal.     Breath sounds: Normal breath sounds.  Abdominal:     General: Abdomen is flat.     Palpations: Abdomen is soft.     Tenderness: There is no abdominal tenderness.  Musculoskeletal:        General: No swelling. Normal range of motion.     Cervical back: Neck supple.  Skin:    General: Skin is warm and dry.     Findings: Rash (urticarial rash to arms, legs and trunk. Face is spared) present.  Neurological:     General: No focal deficit present.     Mental Status: She is alert.  Psychiatric:        Mood and Affect: Mood normal.     ED Results / Procedures / Treatments   Labs (all labs ordered are listed, but only abnormal results are displayed) Labs Reviewed - No data to display  EKG None   Radiology No results found.  Procedures Procedures  Medications Ordered in the ED Medications  predniSONE (DELTASONE) tablet 40 mg (has no administration in time range)     MDM Rules/Calculators/A&P MDM Patient with urticarial rash, unknown trigger but could have been insect stings from last week. Will treat with prednisone, 26m daily. Advised this may cause her blood sugar to be elevated and so she was advised to monitor it closely. RTED for any other concerns.  ED Course  I have reviewed the triage vital signs and the nursing notes.  Pertinent labs & imaging results that were available during my care of the patient were reviewed by me and considered in my medical decision making (see chart  for details).     Final Clinical Impression(s) / ED Diagnoses Final diagnoses:  Urticaria    Rx / DC Orders ED Discharge Orders          Ordered    predniSONE (DELTASONE) 20 MG tablet  Daily        09/03/20 1530             STruddie Hidden MD 09/03/20 1530

## 2020-09-03 NOTE — ED Triage Notes (Signed)
Patient was sitting on her patio Friday or Saturday and was bitten by a mosquito and an ant.  She noted that she was covered with rashes yesterday evening.  She took Benadryl last night without relief.

## 2020-09-03 NOTE — Telephone Encounter (Signed)
The pt was scheduled an appt for a rash.

## 2020-09-04 ENCOUNTER — Encounter: Payer: Self-pay | Admitting: Adult Health

## 2020-09-04 ENCOUNTER — Ambulatory Visit (INDEPENDENT_AMBULATORY_CARE_PROVIDER_SITE_OTHER): Payer: Medicare Other | Admitting: Adult Health

## 2020-09-04 VITALS — BP 117/67 | HR 92 | Ht 62.0 in | Wt 175.6 lb

## 2020-09-04 DIAGNOSIS — Z8673 Personal history of transient ischemic attack (TIA), and cerebral infarction without residual deficits: Secondary | ICD-10-CM

## 2020-09-04 DIAGNOSIS — R519 Headache, unspecified: Secondary | ICD-10-CM

## 2020-09-04 MED ORDER — TOPIRAMATE 25 MG PO TABS: ORAL_TABLET | ORAL | 0 refills | Status: DC

## 2020-09-04 NOTE — Patient Instructions (Addendum)
Your Plan:  Recommend trialing topamax '25mg'$  nightly for 1 week then increase to '25mg'$  twice daily - please let me know how you are doing after that time for possible need of further increase or any difficulty tolerating       Thank you for coming to see Sheila Reynolds at [facility]. I hope we have been able to provide you high quality care today.  You may receive a patient satisfaction survey over the next few weeks. We would appreciate your feedback and comments so that we may continue to improve ourselves and the health of our patients.     Topiramate tablets What is this medication? TOPIRAMATE (toe PYRE a mate) treats seizures in people with epilepsy. It is also used to prevent migraines. It works by calming overactive nerves in yourbody. This medicine may be used for other purposes; ask your health care provider orpharmacist if you have questions. COMMON BRAND NAME(S): Topamax, Topiragen What should I tell my care team before I take this medication? They need to know if you have any of these conditions: Bleeding disorder Kidney disease Lung disease Suicidal thoughts, plans or attempt An unusual or allergic reaction to topiramate, other medications, foods, dyes, or preservatives Pregnant or trying to get pregnant Breast-feeding How should I use this medication? Take this medication by mouth with water. Take it as directed on the prescription label at the same time every day. Do not cut, crush or chew this medicine. Swallow the tablets whole. You can take it with or without food. If it upsets your stomach, take it with food. Keep taking it unless your care teamtells you to stop. A special MedGuide will be given to you by the pharmacist with eachprescription and refill. Be sure to read this information carefully each time. Talk to your care team about the use of this medication in children. While it may be prescribed for children as young as 2 years for selected  conditions,precautions do apply. Overdosage: If you think you have taken too much of this medicine contact apoison control center or emergency room at once. NOTE: This medicine is only for you. Do not share this medicine with others. What if I miss a dose? If you miss a dose, take it as soon as you can unless it is within 6 hours of the next dose. If it is within 6 hours of the next dose, skip the missed dose.Take the next dose at the normal time. Do not take double or extra doses. What may interact with this medication? Acetazolamide Alcohol Antihistamines for allergy, cough, and cold Aspirin and aspirin-like medications Atropine Birth control pills Certain medications for anxiety or sleep Certain medications for bladder problems like oxybutynin, tolterodine Certain medications for depression like amitriptyline, fluoxetine, sertraline Certain medications for Parkinson's disease like benztropine, trihexyphenidyl Certain medications for seizures like carbamazepine, lamotrigine, phenobarbital, phenytoin, primidone, valproic acid, zonisamide Certain medications for stomach problems like dicyclomine, hyoscyamine Certain medications for travel sickness like scopolamine Certain medications that treat or prevent blood clots like warfarin, enoxaparin, dalteparin, apixaban, dabigatran, and rivaroxaban Digoxin Diltiazem General anesthetics like halothane, isoflurane, methoxyflurane, propofol Glyburide Hydrochlorothiazide Ipratropium Lithium Medications that relax muscles Metformin Narcotic medications for pain NSAIDs, medications for pain and inflammation, like ibuprofen or naproxen Phenothiazines like chlorpromazine, mesoridazine, prochlorperazine, thioridazine Pioglitazone This list may not describe all possible interactions. Give your health care provider a list of all the medicines, herbs, non-prescription drugs, or dietary supplements you use. Also tell them if you smoke, drink alcohol,  or  use illegaldrugs. Some items may interact with your medicine. What should I watch for while using this medication? Visit your care team for regular checks on your progress. Tell your care teamif your symptoms do not start to get better or if they get worse. Do not suddenly stop taking this medication. You may develop a severe reaction. Your care team will tell you how much medication to take. If your care team wants you to stop the medication, the dose may be slowly lowered over time toavoid any side effects. Wear a medical ID bracelet or chain. Carry a card that describes yourcondition. List the medications and doses you take on the card. You may get drowsy or dizzy. Do not drive, use machinery, or do anything that needs mental alertness until you know how this medication affects you. Do not stand up or sit up quickly, especially if you are an older patient. This reduces the risk of dizzy or fainting spells. Alcohol may interfere with theeffects of this medication. Avoid alcoholic drinks. This medication may cause serious skin reactions. They can happen weeks to months after starting the medication. Contact your care team right away if you notice fevers or flu-like symptoms with a rash. The rash may be red or purple and then turn into blisters or peeling of the skin. Or, you might notice a red rash with swelling of the face, lips or lymph nodes in your neck or under yourarms. Watch for new or worsening thoughts of suicide or depression. This includes sudden changes in mood, behaviors, or thoughts. These changes can happen at any time but are more common in the beginning of treatment or after a change in dose. Call your care team right away if you experience these thoughts orworsening depression. This medication may slow your child's growth if it is taken for a long time athigh doses. Your care team will monitor your child's growth. Using this medication for a long time may weaken your bones. The risk of  bonefractures may be increased. Talk to your care team about your bone health. Do not become pregnant while taking this medication. Hormone forms of birth control may not work as well with this medication. Talk to your care team about other forms of birth control. There is potential for serious harm to an unbornchild. Tell your care team right away if you think you might be pregnant. What side effects may I notice from receiving this medication? Side effects that you should report to your care team as soon as possible: Allergic reactions-skin rash, itching, hives, swelling of the face, lips, tongue, or throat High acid levels-trouble breathing, fast irregular heartbeat, headache, confusion, unusually weak or tired, nausea, vomiting High ammonia levels-unusual weakness or fatigue, confusion, loss of appetite, nausea, vomiting, seizures High fever, fever that does not go away, or decreased sweating Kidney stones-blood in the urine, pain or trouble passing urine, pain in the lower back or sides Redness, blistering, peeling or loosening of the skin, including inside the mouth Sudden eye pain or change in vision such as blurry vision, seeing halos around lights, vision loss Thoughts of suicide or self-harm, worsening mood, feelings of depression Side effects that usually do not require medical attention (report to your careteam if they continue or are bothersome): Anxiety, nervousness Change in taste Diarrhea Dizziness Drowsiness Fatigue Loss of appetite with weight loss Pain, tingling, or numbness in the hands or feet Trouble concentrating Trouble speaking This list may not describe all possible side effects. Call your doctor for  medical advice about side effects. You may report side effects to FDA at1-800-FDA-1088. Where should I keep my medication? Keep out of the reach of children and pets. Store between 15 and 30 degrees C (59 and 86 degrees F). Protect from moisture. Keep the container  tightly closed. Get rid of any unused medication after theexpiration date. To get rid of medications that are no longer needed or have expired: Take the medication to a medication take-back program. Check with your pharmacy or law enforcement to find a location. If you cannot return the medication, check the label or package insert to see if the medication should be thrown out in the garbage or flushed down the toilet. If you are not sure, ask your care team. If it is safe to put it in the trash, empty the medication out of the container. Mix the medication with cat litter, dirt, coffee grounds, or other unwanted substance. Seal the mixture in a bag or container. Put it in the trash. NOTE: This sheet is a summary. It may not cover all possible information. If you have questions about this medicine, talk to your doctor, pharmacist, orhealth care provider.  2022 Elsevier/Gold Standard (2020-03-12 10:05:08)

## 2020-09-04 NOTE — Progress Notes (Signed)
I agree with the above plan 

## 2020-09-05 ENCOUNTER — Encounter (HOSPITAL_COMMUNITY): Payer: Medicare Other | Admitting: Physical Therapy

## 2020-09-07 ENCOUNTER — Encounter (HOSPITAL_COMMUNITY): Payer: Medicare Other | Admitting: Physical Therapy

## 2020-09-07 ENCOUNTER — Telehealth: Payer: Self-pay

## 2020-09-07 NOTE — Telephone Encounter (Signed)
Transition Care Management Follow-up Telephone Call  Date of discharge and from where: 09/03/2020 [facility] How have you been since you were released from the hospital? Pt states shes doing okay, she had went to the ED, pt stated that they did not do much for her but giver her steroids.  Any questions or concerns? No  Items Reviewed: Did the pt receive and understand the discharge instructions provided? Yes  Medications obtained and verified? Yes  Other? No  Any new allergies since your discharge? Yes  Dietary orders reviewed? Yes Do you have support at home? Yes   Home Care and Equipment/Supplies: Were home health services ordered? not applicable If so, what is the name of the agency? N/a   Has the agency set up a time to come to the patient's home? not applicable Were any new equipment or medical supplies ordered?  No What is the name of the medical supply agency? N/a  Were you able to get the supplies/equipment? not applicable Do you have any questions related to the use of the equipment or supplies? No  Functional Questionnaire: (I = Independent and D = Dependent) ADLs: I  Bathing/Dressing- I  Meal Prep- I  Eating- I  Maintaining continence- I  Transferring/Ambulation- I  Managing Meds- I  Follow up appointments reviewed:  PCP Hospital f/u appt confirmed? Yes  Scheduled to see Raman Ghumman on 09/12/2020 @ [facility]. [facility] f/u appt confirmed? No  Scheduled to see n/a on n/a @ n/a. Are transportation arrangements needed? No  If their condition worsens, is the pt aware to call PCP or go to the Emergency Dept.? Yes Was the patient provided with contact information for the PCP's office or ED? Yes Was to pt encouraged to call back with questions or concerns? Yes

## 2020-09-12 ENCOUNTER — Encounter: Payer: Self-pay | Admitting: Nurse Practitioner

## 2020-09-12 ENCOUNTER — Ambulatory Visit (INDEPENDENT_AMBULATORY_CARE_PROVIDER_SITE_OTHER): Payer: Medicare Other | Admitting: Nurse Practitioner

## 2020-09-12 ENCOUNTER — Other Ambulatory Visit: Payer: Self-pay

## 2020-09-12 VITALS — BP 110/66 | HR 99 | Temp 98.0°F | Ht 62.0 in | Wt 173.2 lb

## 2020-09-12 DIAGNOSIS — Z09 Encounter for follow-up examination after completed treatment for conditions other than malignant neoplasm: Secondary | ICD-10-CM

## 2020-09-12 DIAGNOSIS — E1165 Type 2 diabetes mellitus with hyperglycemia: Secondary | ICD-10-CM

## 2020-09-12 DIAGNOSIS — Z91018 Allergy to other foods: Secondary | ICD-10-CM

## 2020-09-12 DIAGNOSIS — L509 Urticaria, unspecified: Secondary | ICD-10-CM | POA: Diagnosis not present

## 2020-09-12 MED ORDER — EPINEPHRINE 0.3 MG/0.3ML IJ SOAJ: 0.3000 mg | INTRAMUSCULAR | 0 refills | Status: DC | PRN

## 2020-09-12 MED ORDER — ONETOUCH DELICA PLUS LANCET33G MISC: 1 refills | Status: AC

## 2020-09-12 MED ORDER — JANUMET 50-500 MG PO TABS: 1.0000 | ORAL_TABLET | Freq: Two times a day (BID) | ORAL | 1 refills | Status: DC

## 2020-09-12 NOTE — Progress Notes (Signed)
I,Katawbba Wiggins,acting as a Education administrator for [employer], NP.,have documented all relevant documentation on the behalf of [employer], NP,as directed by  Bary Castilla, NP while in the presence of Bary Castilla, NP.  This visit occurred during the SARS-CoV-2 public health emergency.  Safety protocols were in place, including screening questions prior to the visit, additional usage of staff PPE, and extensive cleaning of exam room while observing appropriate contact time as indicated for disinfecting solutions.  Subjective:     Patient ID: Sheila Reynolds , female    DOB: January 23, 1946 , 75 y.o.   MRN: [id number]   Chief Complaint  Patient presents with   Follow-up    HPI  The patient is here today for an emergency room f/u.  The patient states her rash is gone but she still has some itching. They gave her prednisone in the ED and that helped her. She doesn't have any other concerns. She also requests refill for her lancets and janumet. She doesn't recall any changes to her clothes or detergent/body lotions that may have caused her to have the rash. The rash has since resolved. She was given prednisone 20 mg BID for 4 days and that helped her with the rash. She has no other concerns today.   Other Pertinent negatives include no chest pain, chills, coughing, fatigue, headaches or rash.    Past Medical History:  Diagnosis Date   Anemia    3 months ago anemic   Anxiety    on meds   Arthritis    "all over" (01/15/2017)   Asthma    Bronchitis with emphysema    Chest pain    Chronic bronchitis ([place])    Coronary artery disease    a. 01/2017 she underwent orbital atherectomy/DES to the proxmal LAD and PTCA to ostial D2. 2D Echo 01/15/17 showed mild LVH, EF 60-65%, grade 1 DD.   Family history of anesthesia complication    daughter N/V   Fibromyalgia    GERD (gastroesophageal reflux disease)    on meds   Glaucoma    Heart murmur    History of hiatal hernia    Hx of  echocardiogram    Echo (03/2013):  Tech limited; Mild focal basal septal hypertrophy, EF 60-65%, normal RVF   Hyperlipidemia    Hypertension    Nonsustained ventricular tachycardia (HCC)    OSA on CPAP    Premature atrial contractions    PVC (premature ventricular contraction)    a. Holter 12/16: NSR, occ PAC,PVCs   Sarcoidosis    Type II diabetes mellitus ([place])      Family History  Problem Relation Age of Onset   Heart disease Father    Diabetes Father    Glaucoma Father    Cancer Mother        unknown type, ?lung   Heart disease Paternal Grandmother    Cancer Paternal Grandmother        unknown type   Diabetes Brother    Glaucoma Brother      Current Outpatient Medications:    acetaminophen (TYLENOL) 500 MG tablet, Take 1,000 mg by mouth 2 (two) times daily as needed for moderate pain or headache., Disp: , Rfl:    albuterol (PROVENTIL) (2.5 MG/3ML) 0.083% nebulizer solution, Take 3 mLs (2.5 mg total) by nebulization daily as needed for wheezing or shortness of breath., Disp: 75 mL, Rfl: 12   albuterol (VENTOLIN HFA) 108 (90 Base) MCG/ACT inhaler, Inhale 2 puffs into the lungs every 6 (  six) hours as needed for wheezing or shortness of breath., Disp: 18 g, Rfl: 12   aspirin EC 81 MG tablet, Take 81 mg by mouth daily. Swallow whole., Disp: , Rfl:    atorvastatin (LIPITOR) 80 MG tablet, TAKE 1 TABLET(80 MG) BY MOUTH DAILY, Disp: 90 tablet, Rfl: 1   Blood Glucose Monitoring Suppl (ACCU-CHEK AVIVA PLUS) w/Device KIT, Use to check blood sugars 3 times a day. Dx code e11.65, Disp: 1 kit, Rfl: 3   brimonidine (ALPHAGAN P) 0.1 % SOLN, Place 1 drop into both eyes 2 (two) times daily. , Disp: , Rfl:    brinzolamide (AZOPT) 1 % ophthalmic suspension, Place 1 drop into both eyes 3 (three) times daily., Disp: , Rfl:    carvedilol (COREG) 12.5 MG tablet, TAKE 1 TABLET(12.5 MG) BY MOUTH TWICE DAILY, Disp: 180 tablet, Rfl: 3   clopidogrel (PLAVIX) 75 MG tablet, Take 1 tablet (75 mg total) by  mouth daily., Disp: 90 tablet, Rfl: 3   EPINEPHrine 0.3 mg/0.3 mL IJ SOAJ injection, Inject 0.3 mg into the muscle as needed for anaphylaxis., Disp: 1 each, Rfl: 0   ezetimibe (ZETIA) 10 MG tablet, Take 1 tablet (10 mg total) by mouth daily., Disp: 90 tablet, Rfl: 3   furosemide (LASIX) 40 MG tablet, Take 1 tablet (40 mg total) by mouth every other day., Disp: 90 tablet, Rfl: 1   isosorbide mononitrate (IMDUR) 60 MG 24 hr tablet, TAKE 1 TABLET(60 MG) BY MOUTH DAILY, Disp: 90 tablet, Rfl: 3   ketorolac (ACULAR) 0.5 % ophthalmic solution, Place 1 drop into the right eye 3 (three) times daily., Disp: , Rfl:    montelukast (SINGULAIR) 10 MG tablet, TAKE 1 TABLET(10 MG) BY MOUTH EVERY EVENING, Disp: 90 tablet, Rfl: 1   Nebulizers (COMPRESSOR/NEBULIZER) MISC, Use as directed, Disp: 1 each, Rfl: 0   pantoprazole (PROTONIX) 40 MG tablet, Take 40 mg by mouth daily., Disp: , Rfl:    Polyvinyl Alcohol-Povidone PF (REFRESH) 1.4-0.6 % SOLN, Place 1-2 drops into both eyes 3 (three) times daily as needed (dry/irritated eyes.)., Disp: , Rfl:    potassium chloride SA (KLOR-CON) 20 MEQ tablet, Take 1 tablet (20 mEq total) by mouth every other day., Disp: 90 tablet, Rfl: 1   prednisoLONE acetate (PRED FORTE) 1 % ophthalmic suspension, Place 1 drop into both eyes 4 (four) times daily. , Disp: , Rfl:    pregabalin (LYRICA) 75 MG capsule, TAKE 1 CAPSULE(75 MG) BY MOUTH TWICE DAILY, Disp: 180 capsule, Rfl: 1   topiramate (TOPAMAX) 25 MG tablet, Take 1 tablet (25 mg total) by mouth daily for 7 days, THEN 1 tablet (25 mg total) 2 (two) times daily for 23 days., Disp: 53 tablet, Rfl: 0   Travoprost, BAK Free, (TRAVATAN) 0.004 % SOLN ophthalmic solution, Place 1 drop into the right eye at bedtime. , Disp: , Rfl:    umeclidinium-vilanterol (ANORO ELLIPTA) 62.5-25 MCG/INH AEPB, Inhale 1 puff into the lungs daily., Disp: 14 each, Rfl: 0   Lancets (ONETOUCH DELICA PLUS [id number]) MISC, CHECK BLOOD SUGAR BEFORE BREAKFAST AND  DINNER, Disp: 100 each, Rfl: 1   nitroGLYCERIN (NITROSTAT) 0.4 MG SL tablet, Place 1 tablet (0.4 mg total) under the tongue every 5 (five) minutes as needed for chest pain., Disp: 25 tablet, Rfl: prn   sitaGLIPtin-metformin (JANUMET) 50-500 MG tablet, Take 1 tablet by mouth 2 (two) times daily with a meal., Disp: 180 tablet, Rfl: 1   Allergies  Allergen Reactions   Crestor [Rosuvastatin Calcium] Other (See Comments)  muscle aches   Demerol  [Meperidine Hcl]     Other reaction(s): Hallucinations   Shellfish Allergy Itching and Other (See Comments)    Crab, shrimp and lobster ---lips itch and tingle Was told not to eat again after having a allergy test. Lobster, crab and shrimp      Review of Systems  Constitutional:  Negative for chills, fatigue and unexpected weight change.  HENT:  Negative for sinus pressure and sinus pain.   Respiratory:  Negative for cough, shortness of breath and wheezing.   Cardiovascular:  Negative for chest pain and palpitations.  Gastrointestinal:  Negative for constipation and diarrhea.  Skin:  Negative for rash.  Neurological:  Negative for dizziness and headaches.    Today's Vitals   09/12/20 1601  BP: 110/66  Pulse: 99  Temp: 98 F (36.7 C)  TempSrc: Oral  Weight: 173 lb 3.2 oz (78.6 kg)  Height: 5' 2"  (1.575 m)   Body mass index is 31.68 kg/m.  Wt Readings from Last 3 Encounters:  09/12/20 173 lb 3.2 oz (78.6 kg)  09/04/20 175 lb 9.6 oz (79.7 kg)  09/03/20 173 lb (78.5 kg)    BP Readings from Last 3 Encounters:  09/12/20 110/66  09/04/20 117/67  09/03/20 (!) 178/85    Objective:  Physical Exam Constitutional:      Appearance: Normal appearance.  HENT:     Head: Normocephalic and atraumatic.  Cardiovascular:     Rate and Rhythm: Normal rate and regular rhythm.     Pulses: Normal pulses.     Heart sounds: Normal heart sounds.  Pulmonary:     Effort: Pulmonary effort is normal. No respiratory distress.     Breath sounds: Normal  breath sounds. No wheezing.  Skin:    General: Skin is warm and dry.     Capillary Refill: Capillary refill takes less than 2 seconds.     Findings: No lesion or rash.  Neurological:     Mental Status: She is alert and oriented to person, place, and time.        Assessment And Plan:     1. Hospital discharge follow-up She went to the ED with a rash. She states she might have been bitten by mosquito or an ant. The rash was all over. Anti-histamines did not help her. She doesn't recall anything different with her clothing or medication. She reported no swelling or SOB.   2. Urticaria Resolved with the Prednisone 20 mg BID x 4 days  -Consider Refer her to allergist in the future   3. Multiple food allergies - EPINEPHrine 0.3 mg/0.3 mL IJ SOAJ injection; Inject 0.3 mg into the muscle as needed for anaphylaxis.  Dispense: 1 each; Refill: 0  4. Uncontrolled type 2 diabetes mellitus with hyperglycemia (HCC) -Controlled, chronic, stable, continue meds  --Discussed with patient the importance of glycemic control and long term complications from uncontrolled diabetes. Discussed with the patient the importance of compliance with home glucose monitoring, diet which includes decrease amount of sugary drinks and foods. Importance of exercise was also discussed with the patient. Importance of eye exams, self foot care and compliance to office visits was also discussed with the patient.  - Lancets (ONETOUCH DELICA PLUS [id number]) MISC; CHECK BLOOD SUGAR BEFORE BREAKFAST AND DINNER  Dispense: 100 each; Refill: 1 - sitaGLIPtin-metformin (JANUMET) 50-500 MG tablet; Take 1 tablet by mouth 2 (two) times daily with a meal.  Dispense: 180 tablet; Refill: 1   The patient was encouraged to call  or send a message through [facility] for any questions or concerns.   Follow up: if symptoms persist or do not get better.   Side effects and appropriate use of all the medication(s) were discussed with the patient today.  Patient advised to use the medication(s) as directed by their healthcare provider. The patient was encouraged to read, review, and understand all associated package inserts and contact our office with any questions or concerns. The patient accepts the risks of the treatment plan and had an opportunity to ask questions.   Patient was given opportunity to ask questions. Patient verbalized understanding of the plan and was able to repeat key elements of the plan. All questions were answered to their satisfaction.  Raman Jeyren Danowski, DNP   I, Raman Lovette Merta have reviewed all documentation for this visit. The documentation on 09/12/20 for the exam, diagnosis, procedures, and orders are all accurate and complete.    IF YOU HAVE BEEN REFERRED TO A SPECIALIST, IT MAY TAKE 1-2 WEEKS TO SCHEDULE/PROCESS THE REFERRAL. IF YOU HAVE NOT HEARD FROM US/SPECIALIST IN TWO WEEKS, PLEASE GIVE [place] A CALL AT [phone].   THE PATIENT IS ENCOURAGED TO PRACTICE SOCIAL DISTANCING DUE TO THE COVID-19 PANDEMIC.

## 2020-09-24 DIAGNOSIS — M546 Pain in thoracic spine: Secondary | ICD-10-CM | POA: Diagnosis not present

## 2020-09-24 DIAGNOSIS — M26602 Left temporomandibular joint disorder, unspecified: Secondary | ICD-10-CM | POA: Diagnosis not present

## 2020-09-24 DIAGNOSIS — M542 Cervicalgia: Secondary | ICD-10-CM | POA: Diagnosis not present

## 2020-09-24 DIAGNOSIS — G894 Chronic pain syndrome: Secondary | ICD-10-CM | POA: Diagnosis not present

## 2020-09-24 DIAGNOSIS — Z79891 Long term (current) use of opiate analgesic: Secondary | ICD-10-CM | POA: Diagnosis not present

## 2020-09-28 ENCOUNTER — Encounter: Payer: Self-pay | Admitting: Internal Medicine

## 2020-10-01 ENCOUNTER — Encounter: Payer: Self-pay | Admitting: Internal Medicine

## 2020-10-04 ENCOUNTER — Telehealth (HOSPITAL_COMMUNITY): Payer: Self-pay

## 2020-10-04 NOTE — Telephone Encounter (Signed)
Called pt to make sure she was coming to Pulmonary Rehab orientation on 8/29. Pt stated that she was coming. Instructed pt to wear closed toed shoes and to call us if she is not feeling well that morning. Gave pt directions to our department.

## 2020-10-08 ENCOUNTER — Encounter (HOSPITAL_COMMUNITY)
Admission: RE | Admit: 2020-10-08 | Discharge: 2020-10-08 | Disposition: A | Discharge status: Home / Self Care | Payer: Medicare Other | Source: Ambulatory Visit | Attending: Internal Medicine | Admitting: Internal Medicine

## 2020-10-08 ENCOUNTER — Other Ambulatory Visit: Payer: Self-pay

## 2020-10-08 VITALS — BP 114/70 | HR 80 | Ht 62.0 in | Wt 176.4 lb

## 2020-10-08 DIAGNOSIS — D860 Sarcoidosis of lung: Secondary | ICD-10-CM

## 2020-10-08 NOTE — Progress Notes (Signed)
Pulmonary Individual Treatment Plan  Patient Details  Name: Sheila Reynolds MRN: [id number] Date of Birth: 06-Apr-1945 Referring Provider:   April Manson Pulmonary Rehab Walk Test from 10/08/2020 in [facility]  Referring Provider Dr. 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[place])  Patient's Home Medications on Admission:   Current Outpatient Medications:    acetaminophen (TYLENOL) 500 MG tablet, Take 1,000 mg by mouth 2 (two) times daily as needed for moderate pain or headache., Disp: , Rfl:    albuterol (PROVENTIL) (2.5 MG/3ML) 0.083% nebulizer solution, Take 3 mLs (2.5 mg total) by nebulization daily as needed for wheezing or shortness of breath., Disp: 75 mL, Rfl: 12   albuterol (VENTOLIN HFA) 108 (90 Base) MCG/ACT inhaler, Inhale 2 puffs into the lungs every 6 (six) hours as needed for wheezing or shortness of breath., Disp: 18 g, Rfl: 12   aspirin EC 81 MG tablet, Take 81 mg by mouth daily. Swallow whole., Disp: , Rfl:    Blood Glucose Monitoring Suppl (ACCU-CHEK AVIVA PLUS) w/Device KIT, Use to check blood sugars 3 times a day. Dx code e11.65, Disp: 1 kit, Rfl: 3   brimonidine (ALPHAGAN P) 0.1 % SOLN, Place 1 drop into both eyes 2 (two) times daily. , Disp: , Rfl:    brinzolamide (AZOPT) 1 % ophthalmic suspension, Place 1 drop into both eyes 3 (three) times daily., Disp: , Rfl:    carvedilol (COREG) 12.5 MG tablet, TAKE 1 TABLET(12.5 MG) BY MOUTH TWICE DAILY, Disp: 180 tablet, Rfl: 3   clopidogrel (PLAVIX) 75 MG tablet, Take 1 tablet (75 mg total) by mouth daily., Disp: 90 tablet, Rfl: 3   EPINEPHrine 0.3 mg/0.3 mL IJ SOAJ injection, Inject 0.3 mg into the muscle as needed for anaphylaxis., Disp: 1 each, Rfl: 0   ezetimibe (ZETIA) 10 MG tablet, Take 1 tablet (10 mg total) by mouth daily.,  Disp: 90 tablet, Rfl: 3   furosemide (LASIX) 40 MG tablet, Take 1 tablet (40 mg total) by mouth every other day., Disp: 90 tablet, Rfl: 1   isosorbide mononitrate (IMDUR) 60 MG 24 hr tablet, TAKE 1 TABLET(60 MG) BY MOUTH DAILY, Disp: 90 tablet, Rfl: 3   ketorolac (ACULAR) 0.5 % ophthalmic solution, Place 1 drop into the right eye 3 (three) times daily., Disp: , Rfl:    Lancets (ONETOUCH DELICA PLUS [id number]) MISC, CHECK BLOOD SUGAR BEFORE BREAKFAST AND DINNER, Disp: 100 each, Rfl: 1   montelukast (SINGULAIR) 10 MG tablet, TAKE 1 TABLET(10 MG) BY MOUTH EVERY EVENING, Disp: 90 tablet, Rfl: 1   Nebulizers (COMPRESSOR/NEBULIZER) MISC, Use as directed, Disp: 1 each, Rfl: 0   pantoprazole (PROTONIX) 40 MG tablet, Take 40 mg by mouth daily., Disp: , Rfl:    Polyvinyl Alcohol-Povidone PF (REFRESH) 1.4-0.6 % SOLN, Place 1-2 drops into both eyes 3 (three) times daily as needed (dry/irritated eyes.)., Disp: , Rfl:    potassium chloride SA (KLOR-CON) 20 MEQ tablet, Take 1 tablet (20 mEq total) by mouth every other day., Disp: 90 tablet, Rfl: 1   prednisoLONE acetate (PRED FORTE) 1 % ophthalmic suspension, Place 1 drop into both eyes 4 (four) times daily. , Disp: , Rfl:    pregabalin (LYRICA) 75 MG capsule, TAKE 1 CAPSULE(75 MG) BY MOUTH TWICE DAILY, Disp: 180 capsule, Rfl: 1  sitaGLIPtin-metformin (JANUMET) 50-500 MG tablet, Take 1 tablet by mouth 2 (two) times daily with a meal., Disp: 180 tablet, Rfl: 1   Travoprost, BAK Free, (TRAVATAN) 0.004 % SOLN ophthalmic solution, Place 1 drop into the right eye at bedtime. , Disp: , Rfl:    umeclidinium-vilanterol (ANORO ELLIPTA) 62.5-25 MCG/INH AEPB, Inhale 1 puff into the lungs daily., Disp: 14 each, Rfl: 0   atorvastatin (LIPITOR) 80 MG tablet, TAKE 1 TABLET(80 MG) BY MOUTH DAILY (Patient not taking: Reported on 10/08/2020), Disp: 90 tablet, Rfl: 1   nitroGLYCERIN (NITROSTAT) 0.4 MG SL tablet, Place 1 tablet (0.4 mg total) under the tongue every 5 (five) minutes as  needed for chest pain., Disp: 25 tablet, Rfl: prn   topiramate (TOPAMAX) 25 MG tablet, Take 1 tablet (25 mg total) by mouth daily for 7 days, THEN 1 tablet (25 mg total) 2 (two) times daily for 23 days., Disp: 53 tablet, Rfl: 0  Past Medical History: Past Medical History:  Diagnosis Date   Anemia    3 months ago anemic   Anxiety    on meds   Arthritis    "all over" (01/15/2017)   Asthma    Bronchitis with emphysema    Chest pain    Chronic bronchitis ([place])    Coronary artery disease    a. 01/2017 she underwent orbital atherectomy/DES to the proxmal LAD and PTCA to ostial D2. 2D Echo 01/15/17 showed mild LVH, EF 60-65%, grade 1 DD.   Family history of anesthesia complication    daughter N/V   Fibromyalgia    GERD (gastroesophageal reflux disease)    on meds   Glaucoma    Heart murmur    History of hiatal hernia    Hx of echocardiogram    Echo (03/2013):  Tech limited; Mild focal basal septal hypertrophy, EF 60-65%, normal RVF   Hyperlipidemia    Hypertension    Nonsustained ventricular tachycardia (HCC)    OSA on CPAP    Premature atrial contractions    PVC (premature ventricular contraction)    a. Holter 12/16: NSR, occ PAC,PVCs   Sarcoidosis    Type II diabetes mellitus (HCC)     Tobacco Use: Social History   Tobacco Use  Smoking Status Former   Packs/day: 1.00   Years: 4.00   Pack years: 4.00   Types: Cigarettes   Quit date: 02/11/1980   Years since quitting: 40.6  Smokeless Tobacco Never    Labs: Recent Review Flowsheet Data     Labs for ITP Cardiac and Pulmonary Rehab Latest Ref Rng & Units 08/02/2019 11/28/2019 03/28/2020 04/03/2020 08/01/2020   Cholestrol 100 - 199 mg/dL - - - 152 -   LDLCALC 0 - 99 mg/dL - - - 89 -   HDL >39 mg/dL - - - 44 -   Trlycerides 0 - 149 mg/dL - - - 104 -   Hemoglobin A1c 4.8 - 5.6 % 6.2(H) 6.3(H) 6.1(H) 6.0(H) 6.1(H)   PHART 7.350 - 7.450 - - - - -   PCO2ART 32.0 - 48.0 mmHg - - - - -   HCO3 20.0 - 28.0 mmol/L - - - - -   TCO2  22 - 32 mmol/L - - - - -   O2SAT % - - - - -       Capillary Blood Glucose: Lab Results  Component Value Date   GLUCAP 118 (H) 01/10/2020   GLUCAP 129 (H) 04/24/2017   GLUCAP 108 (H) 04/24/2017   GLUCAP  117 (H) 04/20/2017   GLUCAP 145 (H) 04/15/2017     Pulmonary Assessment Scores:  Pulmonary Assessment Scores     Row Name 10/08/20 1142         ADL UCSD   ADL Phase Entry     SOB Score total 50           CAT Score   CAT Score 29             UCSD: Self-administered rating of dyspnea associated with activities of daily living (ADLs) 6-point scale (0 = "not at all" to 5 = "maximal or unable to do because of breathlessness")  Scoring Scores range from 0 to 120.  Minimally important difference is 5 units  CAT: CAT can identify the health impairment of COPD patients and is better correlated with disease progression.  CAT has a scoring range of zero to 40. The CAT score is classified into four groups of low (less than 10), medium (10 - 20), high (21-30) and very high (31-40) based on the impact level of disease on health status. A CAT score over 10 suggests significant symptoms.  A worsening CAT score could be explained by an exacerbation, poor medication adherence, poor inhaler technique, or progression of COPD or comorbid conditions.  CAT MCID is 2 points  mMRC: mMRC (Modified Medical Research Council) Dyspnea Scale is used to assess the degree of baseline functional disability in patients of respiratory disease due to dyspnea. No minimal important difference is established. A decrease in score of 1 point or greater is considered a positive change.   Pulmonary Function Assessment:  Pulmonary Function Assessment - 10/08/20 1019       Breath   Bilateral Breath Sounds Clear    Shortness of Breath Yes;Limiting activity             Exercise Target Goals: Exercise Program Goal: Individual exercise prescription set using results from initial 6 min walk test and  THRR while considering  patient's activity barriers and safety.   Exercise Prescription Goal: Initial exercise prescription builds to 30-45 minutes a day of aerobic activity, 2-3 days per week.  Home exercise guidelines will be given to patient during program as part of exercise prescription that the participant will acknowledge.  Activity Barriers & Risk Stratification:  Activity Barriers & Cardiac Risk Stratification - 10/08/20 1017       Activity Barriers & Cardiac Risk Stratification   Activity Barriers Arthritis;Joint Problems;Deconditioning;Muscular Weakness;Shortness of Breath;Balance Concerns;History of Falls             6 Minute Walk:  6 Minute Walk     Row Name 10/08/20 1105         6 Minute Walk   Phase Initial     Distance 935 feet  Used rollator     Walk Time 6 minutes     # of Rest Breaks 0     MPH 1.77     METS 1.8     RPE 14     Perceived Dyspnea  2     VO2 Peak 6.3     Resting HR 89 bpm     Resting BP 122/70     Resting Oxygen Saturation  99 %     Exercise Oxygen Saturation  during 6 min walk 98 %     Max Ex. HR 104 bpm     Max Ex. BP 130/80     2 Minute Post BP 124/70  Interval HR   1 Minute HR 96     2 Minute HR 100     3 Minute HR 101     4 Minute HR 104     5 Minute HR 102     6 Minute HR 102     2 Minute Post HR 69     Interval Heart Rate? Yes           Interval Oxygen   Interval Oxygen? Yes     Baseline Oxygen Saturation % 99 %     1 Minute Oxygen Saturation % 100 %     1 Minute Liters of Oxygen 0 L     2 Minute Oxygen Saturation % 99 %     2 Minute Liters of Oxygen 0 L     3 Minute Oxygen Saturation % 98 %     3 Minute Liters of Oxygen 0 L     4 Minute Oxygen Saturation % 100 %     4 Minute Liters of Oxygen 0 L     5 Minute Oxygen Saturation % 100 %     5 Minute Liters of Oxygen 0 L     6 Minute Oxygen Saturation % 98 %     6 Minute Liters of Oxygen 0 L     2 Minute Post Oxygen Saturation % 99 %     2 Minute Post  Liters of Oxygen 0 L              Oxygen Initial Assessment:  Oxygen Initial Assessment - 10/08/20 1018       Home Oxygen   Home Oxygen Device None    Sleep Oxygen Prescription CPAP    Liters per minute 0    Home Exercise Oxygen Prescription None    Home Resting Oxygen Prescription None    Compliance with Home Oxygen Use Yes      Intervention   Short Term Goals To learn and understand importance of monitoring SPO2 with pulse oximeter and demonstrate accurate use of the pulse oximeter.;To learn and understand importance of maintaining oxygen saturations>88%;To learn and demonstrate proper pursed lip breathing techniques or other breathing techniques. ;To learn and demonstrate proper use of respiratory medications             Oxygen Re-Evaluation:   Oxygen Discharge (Final Oxygen Re-Evaluation):   Initial Exercise Prescription:  Initial Exercise Prescription - 10/08/20 1100       Date of Initial Exercise RX and Referring Provider   Date 10/08/20    Referring Provider Dr. Annamaria Boots    Expected Discharge Date 12/13/20      NuStep   Level 1    SPM 70    Minutes 15      Arm Ergometer   Level 1    Watts 10    Minutes 15      Prescription Details   Frequency (times per week) 2    Duration Progress to 30 minutes of continuous aerobic without signs/symptoms of physical distress      Intensity   THRR 40-80% of Max Heartrate 59-118    Ratings of Perceived Exertion 11-13    Perceived Dyspnea 0-4      Progression   Progression Continue to progress workloads to maintain intensity without signs/symptoms of physical distress.      Resistance Training   Training Prescription Yes    Weight Red bands    Reps 10-15  Perform Capillary Blood Glucose checks as needed.  Exercise Prescription Changes:   Exercise Comments:   Exercise Goals and Review:   Exercise Goals     Row Name 10/08/20 1128             Exercise Goals   Increase Physical  Activity Yes       Expected Outcomes Short Term: Attend rehab on a regular basis to increase amount of physical activity.;Long Term: Add in home exercise to make exercise part of routine and to increase amount of physical activity.;Long Term: Exercising regularly at least 3-5 days a week.       Increase Strength and Stamina Yes       Expected Outcomes Short Term: Increase workloads from initial exercise prescription for resistance, speed, and METs.;Short Term: Perform resistance training exercises routinely during rehab and add in resistance training at home;Long Term: Improve cardiorespiratory fitness, muscular endurance and strength as measured by increased METs and functional capacity (6MWT)       Able to understand and use rate of perceived exertion (RPE) scale Yes       Intervention Provide education and explanation on how to use RPE scale       Expected Outcomes Short Term: Able to use RPE daily in rehab to express subjective intensity level;Long Term:  Able to use RPE to guide intensity level when exercising independently       Able to understand and use Dyspnea scale Yes       Intervention Provide education and explanation on how to use Dyspnea scale       Expected Outcomes Short Term: Able to use Dyspnea scale daily in rehab to express subjective sense of shortness of breath during exertion;Long Term: Able to use Dyspnea scale to guide intensity level when exercising independently       Knowledge and understanding of Target Heart Rate Range (THRR) Yes       Intervention Provide education and explanation of THRR including how the numbers were predicted and where they are located for reference       Expected Outcomes Short Term: Able to state/look up THRR;Short Term: Able to use daily as guideline for intensity in rehab;Long Term: Able to use THRR to govern intensity when exercising independently       Understanding of Exercise Prescription Yes       Intervention Provide education, explanation,  and written materials on patient's individual exercise prescription       Expected Outcomes Short Term: Able to explain program exercise prescription;Long Term: Able to explain home exercise prescription to exercise independently                Exercise Goals Re-Evaluation :   Discharge Exercise Prescription (Final Exercise Prescription Changes):   Nutrition:  Target Goals: Understanding of nutrition guidelines, daily intake of sodium '1500mg'$ , cholesterol '200mg'$ , calories 30% from fat and 7% or less from saturated fats, daily to have 5 or more servings of fruits and vegetables.  Biometrics:  Pre Biometrics - 10/08/20 1017       Pre Biometrics   Height $Remov'5\' 2"'ncQeVa$  (1.575 m)    Weight 80 kg    BMI (Calculated) 32.25    Grip Strength 16.5 kg              Nutrition Therapy Plan and Nutrition Goals:   Nutrition Assessments:  MEDIFICTS Score Key: ?70 Need to make dietary changes  40-70 Heart Healthy Diet ? 40 Therapeutic Level Cholesterol Diet   Picture  Your Plate Scores: <63 Unhealthy dietary pattern with much room for improvement. 41-50 Dietary pattern unlikely to meet recommendations for good health and room for improvement. 51-60 More healthful dietary pattern, with some room for improvement.  >60 Healthy dietary pattern, although there may be some specific behaviors that could be improved.    Nutrition Goals Re-Evaluation:   Nutrition Goals Discharge (Final Nutrition Goals Re-Evaluation):   Psychosocial: Target Goals: Acknowledge presence or absence of significant depression and/or stress, maximize coping skills, provide positive support system. Participant is able to verbalize types and ability to use techniques and skills needed for reducing stress and depression.  Initial Review & Psychosocial Screening:  Initial Psych Review & Screening - 10/08/20 1019       Initial Review   Current issues with Current Depression;Current Stress Concerns    Source of  Stress Concerns Family;Chronic Illness;Unable to participate in former interests or hobbies;Unable to perform yard/household activities    Comments Does not feel she needs therapy or medication for her depression.  She lost several family members and feels her depression has improved.      Family Dynamics   Good Support System? Yes      Barriers   Psychosocial barriers to participate in program The patient should benefit from training in stress management and relaxation.      Screening Interventions   Interventions Encouraged to exercise    Expected Outcomes Long Term Goal: Stressors or current issues are controlled or eliminated.             Quality of Life Scores:  Scores of 19 and below usually indicate a poorer quality of life in these areas.  A difference of  2-3 points is a clinically meaningful difference.  A difference of 2-3 points in the total score of the Quality of Life Index has been associated with significant improvement in overall quality of life, self-image, physical symptoms, and general health in studies assessing change in quality of life.  PHQ-9: Recent Review Flowsheet Data     Depression screen [facility] 2/9 10/08/2020 03/28/2020 03/23/2019 03/18/2018 03/18/2018   Decreased Interest 0 0 1 1 0   Down, Depressed, Hopeless 1 0 1 - 3   PHQ - 2 Score 1 0 _0 Altered sleeping 1  - 3 - 0   Tired, decreased energy 1 - 1 - 3   Change in appetite 0 - 0 - 0   Feeling bad or failure about yourself  0 - 0 - 0   Trouble concentrating 0 - 1 - 3   Moving slowly or fidgety/restless 0 - 0 - 0   Suicidal thoughts 0 - 0 - 0   PHQ-9 Score - - 7 - 9   Difficult doing work/chores - - Somewhat difficult  - -      Interpretation of Total Score  Total Score Depression Severity:  1-4 = Minimal depression, 5-9 = Mild depression, 10-14 = Moderate depression, 15-19 = Moderately severe depression, 20-27 = Severe depression   Psychosocial Evaluation and Intervention:  Psychosocial  Evaluation - 10/08/20 1026       Psychosocial Evaluation & Interventions   Interventions Encouraged to exercise with the program and follow exercise prescription;Stress management education;Relaxation education    Comments Is still very involved in church and likes to go to football games, enjoys reading, but does not like being able to do things she use to enjoy due to her chronic illnesses.    Expected Outcomes For  [place] to handle her stress in healthy ways.    Continue Psychosocial Services  Follow up required by staff             Psychosocial Re-Evaluation:   Psychosocial Discharge (Final Psychosocial Re-Evaluation):   Education: Education Goals: Education classes will be provided on a weekly basis, covering required topics. Participant will state understanding/return demonstration of topics presented.  Learning Barriers/Preferences:  Learning Barriers/Preferences - 10/08/20 1040       Learning Barriers/Preferences   Learning Barriers None    Learning Preferences Skilled Demonstration;Pictoral;Individual Instruction;Group Instruction;Audio;Verbal Instruction;Video;Written Material             Education Topics: Risk Factor Reduction:  -Group instruction that is supported by a PowerPoint presentation. Instructor discusses the definition of a risk factor, different risk factors for pulmonary disease, and how the heart and lungs work together.     Nutrition for Pulmonary Patient:  -Group instruction provided by PowerPoint slides, verbal discussion, and written materials to support subject matter. The instructor gives an explanation and review of healthy diet recommendations, which includes a discussion on weight management, recommendations for fruit and vegetable consumption, as well as protein, fluid, caffeine, fiber, sodium, sugar, and alcohol. Tips for eating when patients are short of breath are discussed.   Pursed Lip Breathing:  -Group instruction that is  supported by demonstration and informational handouts. Instructor discusses the benefits of pursed lip and diaphragmatic breathing and detailed demonstration on how to preform both.     Oxygen Safety:  -Group instruction provided by PowerPoint, verbal discussion, and written material to support subject matter. There is an overview of "What is Oxygen" and "Why do we need it".  Instructor also reviews how to create a safe environment for oxygen use, the importance of using oxygen as prescribed, and the risks of noncompliance. There is a brief discussion on traveling with oxygen and resources the patient may utilize.   Oxygen Equipment:  -Group instruction provided by [facility] Staff utilizing handouts, written materials, and equipment demonstrations.   Signs and Symptoms:  -Group instruction provided by written material and verbal discussion to support subject matter. Warning signs and symptoms of infection, stroke, and heart attack are reviewed and when to call the physician/911 reinforced. Tips for preventing the spread of infection discussed.   Advanced Directives:  -Group instruction provided by verbal instruction and written material to support subject matter. Instructor reviews Advanced Directive laws and proper instruction for filling out document.   Pulmonary Video:  -Group video education that reviews the importance of medication and oxygen compliance, exercise, good nutrition, pulmonary hygiene, and pursed lip and diaphragmatic breathing for the pulmonary patient.   Exercise for the Pulmonary Patient:  -Group instruction that is supported by a PowerPoint presentation. Instructor discusses benefits of exercise, core components of exercise, frequency, duration, and intensity of an exercise routine, importance of utilizing pulse oximetry during exercise, safety while exercising, and options of places to exercise outside of rehab.     Pulmonary Medications:  -Verbally interactive  group education provided by instructor with focus on inhaled medications and proper administration.   Anatomy and Physiology of the Respiratory System and Intimacy:  -Group instruction provided by PowerPoint, verbal discussion, and written material to support subject matter. Instructor reviews respiratory cycle and anatomical components of the respiratory system and their functions. Instructor also reviews differences in obstructive and restrictive respiratory diseases with examples of each. Intimacy, Sex, and Sexuality differences are reviewed with a discussion on how relationships can change  when diagnosed with pulmonary disease. Common sexual concerns are reviewed.   MD DAY -A group question and answer session with a medical doctor that allows participants to ask questions that relate to their pulmonary disease state.   OTHER EDUCATION -Group or individual verbal, written, or video instructions that support the educational goals of the pulmonary rehab program. [place] from 05/27/2017 in [place]  Date 04/22/17  Instruction Review Code 2- Demonstrated Understanding       Holiday Eating Survival Tips:  -Group instruction provided by PowerPoint slides, verbal discussion, and written materials to support subject matter. The instructor gives patients tips, tricks, and techniques to help them not only survive but enjoy the holidays despite the onslaught of food that accompanies the holidays.   Knowledge Questionnaire Score:  Knowledge Questionnaire Score - 10/08/20 1143       Knowledge Questionnaire Score   Pre Score 15/18             Core Components/Risk Factors/Patient Goals at Admission:  Personal Goals and Risk Factors at Admission - 10/08/20 1041       Core Components/Risk Factors/Patient Goals on Admission   Improve shortness of breath with ADL's Yes    Intervention Provide education, individualized  exercise plan and daily activity instruction to help decrease symptoms of SOB with activities of daily living.    Expected Outcomes Short Term: Improve cardiorespiratory fitness to achieve a reduction of symptoms when performing ADLs;Long Term: Be able to perform more ADLs without symptoms or delay the onset of symptoms             Core Components/Risk Factors/Patient Goals Review:    Core Components/Risk Factors/Patient Goals at Discharge (Final Review):    ITP Comments:   Comments:

## 2020-10-08 NOTE — Progress Notes (Signed)
Sheila Reynolds 75 y.o. female Pulmonary Rehab Orientation Note This patient who was referred to Pulmonary rehab by Dr. Baird Lyons with the diagnosis of sarcoidosis of the lung arrived today in Cardiac and Pulmonary Rehab. She arrived ambulatory/wheelchair/assistive device with slow but steady gait. She does not carry portable oxygen.  Per pt, she uses oxygen never. Color good, skin warm and dry. Patient is oriented to time and place. Patient's medical history, psychosocial health, and medications reviewed. Psychosocial assessment reveals pt lives with their spouse. Pt is currently retired. Pt hobbies include going to football games, reading, going to church and being involved with the women's group there. Pt reports her stress level is low. Areas of stress/anxiety include Health.  Pt does exhibit signs of depression. Signs of depression include sadness due losing several family members she was close to, but that improves with time and difficulty maintaining sleep. PHQ2/9 score 1/2. Pt shows good  coping skills with positive outlook . She talks with her minister when she feels the need, her depression has improved but at times she is sad that she cannot do things she use to enjoy. Offered emotional support and reassurance. Will continue to monitor and evaluate progress toward psychosocial goal(s) of ability to handle her stress and depression with healthy habits. Physical assessment reveals heart rate is normal, breath sounds clear to auscultation, no wheezes, rales, or rhonchi. Grip strength equal, strong. Distal pulses unpappable due to severe lumphedema with the right leg more swollen than the right. Patient reports she does take medications as prescribed. Patient states she follows a Diabetic diet. The patient reports no specific efforts to gain or lose weight.. Patient's weight will be monitored closely. Demonstration and practice of PLB using pulse oximeter. Patient able to return demonstration  satisfactorily. Safety and hand hygiene in the exercise area reviewed with patient. Patient voices understanding of the information reviewed. Department expectations discussed with patient and achievable goals were set. The patient shows enthusiasm about attending the program and we look forward to working with this nice lady. The patient completed a 6 min walk test today, 10/08/2020 and to begin exercise on 10/16/2020 in the 1015 am class.  0900-1200

## 2020-10-10 ENCOUNTER — Other Ambulatory Visit: Payer: Self-pay

## 2020-10-10 MED ORDER — TOPIRAMATE 25 MG PO TABS: 25.0000 mg | ORAL_TABLET | Freq: Two times a day (BID) | ORAL | 1 refills | Status: DC

## 2020-10-11 DIAGNOSIS — H3581 Retinal edema: Secondary | ICD-10-CM | POA: Diagnosis not present

## 2020-10-11 DIAGNOSIS — H35373 Puckering of macula, bilateral: Secondary | ICD-10-CM | POA: Diagnosis not present

## 2020-10-11 DIAGNOSIS — H2013 Chronic iridocyclitis, bilateral: Secondary | ICD-10-CM | POA: Diagnosis not present

## 2020-10-11 LAB — HM DIABETES EYE EXAM

## 2020-10-16 ENCOUNTER — Other Ambulatory Visit: Payer: Self-pay

## 2020-10-16 ENCOUNTER — Encounter (HOSPITAL_COMMUNITY)
Admission: RE | Admit: 2020-10-16 | Discharge: 2020-10-16 | Disposition: A | Discharge status: Home / Self Care | Payer: Medicare Other | Source: Ambulatory Visit | Attending: Internal Medicine | Admitting: Internal Medicine

## 2020-10-16 VITALS — Wt 175.0 lb

## 2020-10-16 DIAGNOSIS — Z7984 Long term (current) use of oral hypoglycemic drugs: Secondary | ICD-10-CM | POA: Insufficient documentation

## 2020-10-16 DIAGNOSIS — D860 Sarcoidosis of lung: Secondary | ICD-10-CM | POA: Insufficient documentation

## 2020-10-16 DIAGNOSIS — Z7951 Long term (current) use of inhaled steroids: Secondary | ICD-10-CM | POA: Diagnosis not present

## 2020-10-16 DIAGNOSIS — Z79899 Other long term (current) drug therapy: Secondary | ICD-10-CM | POA: Insufficient documentation

## 2020-10-16 DIAGNOSIS — Z7902 Long term (current) use of antithrombotics/antiplatelets: Secondary | ICD-10-CM | POA: Diagnosis not present

## 2020-10-16 LAB — GLUCOSE, CAPILLARY
Glucose-Capillary: 155 mg/dL — ABNORMAL HIGH (ref 70–99)
Glucose-Capillary: 103 mg/dL — ABNORMAL HIGH (ref 70–99)

## 2020-10-16 NOTE — Progress Notes (Signed)
Daily Session Note  Patient Details  Name: Sheila Reynolds MRN: [id number] Date of Birth: 04-12-1945 Referring Provider:   April Manson Pulmonary Rehab Walk Test from 10/08/2020 in [place]  Referring Provider Dr. Annamaria Boots       Encounter Date: 10/16/2020  Check In:  Session Check In - 10/16/20 1117       Check-In   Supervising physician immediately available to respond to emergencies [facility] immediately available    Physician(s) Dr. Doristine Bosworth    Location [facility]    Staff Present Rosebud Poles, RN, Quentin Ore, MS, ACSM-CEP, Exercise Physiologist;Danuta Huseman Ysidro Evert, RN    Virtual Visit No    Medication changes reported     No    Fall or balance concerns reported    No    Tobacco Cessation No Change    Warm-up and Cool-down Performed as group-led instruction    Resistance Training Performed Yes    VAD Patient? No    PAD/SET Patient? No      Pain Assessment   Currently in Pain? No/denies    Multiple Pain Sites No             Capillary Blood Glucose: Results for orders placed or performed during the hospital encounter of 10/16/20 (from the past 24 hour(s))  Glucose, capillary     Status: Abnormal   Collection Time: 10/16/20 10:17 AM  Result Value Ref Range   Glucose-Capillary 155 (H) 70 - 99 mg/dL  Glucose, capillary     Status: Abnormal   Collection Time: 10/16/20 11:24 AM  Result Value Ref Range   Glucose-Capillary 103 (H) 70 - 99 mg/dL     Exercise Prescription Changes - 10/16/20 1100       Response to Exercise   Blood Pressure (Admit) 136/64    Blood Pressure (Exercise) 120/66    Blood Pressure (Exit) 100/60    Heart Rate (Admit) 83 bpm    Heart Rate (Exercise) 100 bpm    Heart Rate (Exit) 80 bpm    Oxygen Saturation (Admit) 97 %    Oxygen Saturation (Exercise) 97 %    Oxygen Saturation (Exit) 99 %    Rating of Perceived Exertion (Exercise) 11    Perceived Dyspnea (Exercise) 2    Duration  Progress to 30 minutes of  aerobic without signs/symptoms of physical distress    Intensity Other (comment)   40-80% of HRR     Progression   Progression Continue to progress workloads to maintain intensity without signs/symptoms of physical distress.      Resistance Training   Training Prescription Yes    Weight Red bands    Reps 10-15    Time 10 Minutes      NuStep   Level 1    SPM 70    Minutes 15    METs 1.6      Arm Ergometer   Level 1    Watts 15    Minutes 15             Social History   Tobacco Use  Smoking Status Former   Packs/day: 1.00   Years: 4.00   Pack years: 4.00   Types: Cigarettes   Quit date: 02/11/1980   Years since quitting: 40.7  Smokeless Tobacco Never    Goals Met:  Exercise tolerated well No report of concerns or symptoms today Strength training completed today  Goals Unmet:  Not Applicable  Comments: Service time is from  1020 to 1130    Dr. Fransico Him is Medical Director for Cardiac Rehab at [facility].

## 2020-10-18 ENCOUNTER — Encounter (HOSPITAL_COMMUNITY)
Admission: RE | Admit: 2020-10-18 | Discharge: 2020-10-18 | Disposition: A | Discharge status: Home / Self Care | Payer: Medicare Other | Source: Ambulatory Visit | Attending: Internal Medicine | Admitting: Internal Medicine

## 2020-10-18 ENCOUNTER — Other Ambulatory Visit: Payer: Self-pay

## 2020-10-18 DIAGNOSIS — Z7984 Long term (current) use of oral hypoglycemic drugs: Secondary | ICD-10-CM | POA: Diagnosis not present

## 2020-10-18 DIAGNOSIS — Z79899 Other long term (current) drug therapy: Secondary | ICD-10-CM | POA: Diagnosis not present

## 2020-10-18 DIAGNOSIS — Z7951 Long term (current) use of inhaled steroids: Secondary | ICD-10-CM | POA: Diagnosis not present

## 2020-10-18 DIAGNOSIS — D860 Sarcoidosis of lung: Secondary | ICD-10-CM | POA: Diagnosis not present

## 2020-10-18 DIAGNOSIS — Z7902 Long term (current) use of antithrombotics/antiplatelets: Secondary | ICD-10-CM | POA: Diagnosis not present

## 2020-10-18 LAB — GLUCOSE, CAPILLARY
Glucose-Capillary: 130 mg/dL — ABNORMAL HIGH (ref 70–99)
Glucose-Capillary: 80 mg/dL (ref 70–99)

## 2020-10-18 NOTE — Progress Notes (Signed)
Daily Session Note  Patient Details  Name: Sheila Reynolds MRN: [id number] Date of Birth: 03-26-1945 Referring Provider:   April Manson Pulmonary Rehab Walk Test from 10/08/2020 in [place]  Referring Provider Dr. Annamaria Boots       Encounter Date: 10/18/2020  Check In:  Session Check In - 10/18/20 1108       Check-In   Supervising physician immediately available to respond to emergencies [facility] immediately available    Physician(s) Dr. Florene Glen    Location [facility]    Staff Present Rosebud Poles, RN, Quentin Ore, MS, ACSM-CEP, Exercise Physiologist;Allah Reason Ysidro Evert, RN    Virtual Visit No    Medication changes reported     No    Fall or balance concerns reported    No    Tobacco Cessation No Change    Warm-up and Cool-down Performed as group-led instruction    Resistance Training Performed Yes    VAD Patient? No    PAD/SET Patient? No      Pain Assessment   Currently in Pain? No/denies    Pain Score 0-No pain    Multiple Pain Sites No             Capillary Blood Glucose: Results for orders placed or performed during the hospital encounter of 10/18/20 (from the past 24 hour(s))  Glucose, capillary     Status: Abnormal   Collection Time: 10/18/20 10:16 AM  Result Value Ref Range   Glucose-Capillary 130 (H) 70 - 99 mg/dL  Glucose, capillary     Status: None   Collection Time: 10/18/20 11:14 AM  Result Value Ref Range   Glucose-Capillary 80 70 - 99 mg/dL      Social History   Tobacco Use  Smoking Status Former   Packs/day: 1.00   Years: 4.00   Pack years: 4.00   Types: Cigarettes   Quit date: 02/11/1980   Years since quitting: 40.7  Smokeless Tobacco Never    Goals Met:  Exercise tolerated well No report of concerns or symptoms today Strength training completed today  Goals Unmet:  Not Applicable  Comments: Service time is from 1020 to 1130    Dr. Fransico Him is Medical Director for  Cardiac Rehab at [facility].

## 2020-10-23 ENCOUNTER — Encounter (HOSPITAL_COMMUNITY)
Admission: RE | Admit: 2020-10-23 | Discharge: 2020-10-23 | Disposition: A | Discharge status: Home / Self Care | Payer: Medicare Other | Source: Ambulatory Visit | Attending: Internal Medicine | Admitting: Internal Medicine

## 2020-10-23 ENCOUNTER — Other Ambulatory Visit: Payer: Self-pay

## 2020-10-23 VITALS — Wt 178.6 lb

## 2020-10-23 DIAGNOSIS — M26602 Left temporomandibular joint disorder, unspecified: Secondary | ICD-10-CM | POA: Diagnosis not present

## 2020-10-23 DIAGNOSIS — M542 Cervicalgia: Secondary | ICD-10-CM | POA: Diagnosis not present

## 2020-10-23 DIAGNOSIS — Z7951 Long term (current) use of inhaled steroids: Secondary | ICD-10-CM | POA: Diagnosis not present

## 2020-10-23 DIAGNOSIS — D860 Sarcoidosis of lung: Secondary | ICD-10-CM

## 2020-10-23 DIAGNOSIS — Z7902 Long term (current) use of antithrombotics/antiplatelets: Secondary | ICD-10-CM | POA: Diagnosis not present

## 2020-10-23 DIAGNOSIS — R519 Headache, unspecified: Secondary | ICD-10-CM | POA: Diagnosis not present

## 2020-10-23 DIAGNOSIS — Z7984 Long term (current) use of oral hypoglycemic drugs: Secondary | ICD-10-CM | POA: Diagnosis not present

## 2020-10-23 DIAGNOSIS — M546 Pain in thoracic spine: Secondary | ICD-10-CM | POA: Diagnosis not present

## 2020-10-23 DIAGNOSIS — Z79899 Other long term (current) drug therapy: Secondary | ICD-10-CM | POA: Diagnosis not present

## 2020-10-23 NOTE — Progress Notes (Signed)
Daily Session Note  Patient Details  Name: Sheila Reynolds MRN: [id number] Date of Birth: 31-Oct-1945 Referring Provider:   April Manson Pulmonary Rehab Walk Test from 10/08/2020 in [place]  Referring Provider Dr. Annamaria Boots       Encounter Date: 10/23/2020  Check In:  Session Check In - 10/23/20 1143       Check-In   Supervising physician immediately available to respond to emergencies [facility] immediately available    Physician(s) Dr. Florene Glen    Location [facility]    Staff Present Rosebud Poles, RN, Quentin Ore, MS, ACSM-CEP, Exercise Physiologist;Olinty Celesta Aver, MS, ACSM CEP, Exercise Physiologist    Virtual Visit No    Medication changes reported     No    Fall or balance concerns reported    No    Tobacco Cessation No Change    Warm-up and Cool-down Performed as group-led instruction    Resistance Training Performed Yes    VAD Patient? No      Pain Assessment   Currently in Pain? No/denies    Multiple Pain Sites No             Capillary Blood Glucose: No results found for this or any previous visit (from the past 24 hour(s)).    Social History   Tobacco Use  Smoking Status Former   Packs/day: 1.00   Years: 4.00   Pack years: 4.00   Types: Cigarettes   Quit date: 02/11/1980   Years since quitting: 40.7  Smokeless Tobacco Never    Goals Met:  Proper associated with RPD/PD & O2 Sat Exercise tolerated well No report of concerns or symptoms today Strength training completed today  Goals Unmet:  Not Applicable  Comments: Service time is from 1023 to 1122    Dr. Fransico Him is Medical Director for Cardiac Rehab at [facility].

## 2020-10-23 NOTE — Progress Notes (Deleted)
Pulmonary Individual Treatment Plan  Patient Details  Name: Sheila Reynolds MRN: [id number] Date of Birth: May 19, 1945 Referring Provider:   April Manson Pulmonary Rehab Walk Test from 10/08/2020 in [place]  Referring Provider Dr. 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[place])  Patient's Home Medications on Admission:   Current Outpatient Medications:    acetaminophen (TYLENOL) 500 MG tablet, Take 1,000 mg by mouth 2 (two) times daily as needed for moderate pain or headache., Disp: , Rfl:    albuterol (PROVENTIL) (2.5 MG/3ML) 0.083% nebulizer solution, Take 3 mLs (2.5 mg total) by nebulization daily as needed for wheezing or shortness of breath., Disp: 75 mL, Rfl: 12   albuterol (VENTOLIN HFA) 108 (90 Base) MCG/ACT inhaler, Inhale 2 puffs into the lungs every 6 (six) hours as needed for wheezing or shortness of breath., Disp: 18 g, Rfl: 12   aspirin EC 81 MG tablet, Take 81 mg by mouth daily. Swallow whole., Disp: , Rfl:    atorvastatin (LIPITOR) 80 MG tablet, TAKE 1 TABLET(80 MG) BY MOUTH DAILY (Patient not taking: Reported on 10/08/2020), Disp: 90 tablet, Rfl: 1   Blood Glucose Monitoring Suppl (ACCU-CHEK AVIVA PLUS) w/Device KIT, Use to check blood sugars 3 times a day. Dx code e11.65, Disp: 1 kit, Rfl: 3   brimonidine (ALPHAGAN P) 0.1 % SOLN, Place 1 drop into both eyes 2 (two) times daily. , Disp: , Rfl:    brinzolamide (AZOPT) 1 % ophthalmic suspension, Place 1 drop into both eyes 3 (three) times daily., Disp: , Rfl:    carvedilol (COREG) 12.5 MG tablet, TAKE 1 TABLET(12.5 MG) BY MOUTH TWICE DAILY, Disp: 180 tablet, Rfl: 3   clopidogrel (PLAVIX) 75 MG tablet, Take 1 tablet (75 mg total) by mouth daily., Disp: 90 tablet, Rfl: 3   EPINEPHrine 0.3 mg/0.3 mL IJ SOAJ injection, Inject 0.3 mg  into the muscle as needed for anaphylaxis., Disp: 1 each, Rfl: 0   ezetimibe (ZETIA) 10 MG tablet, Take 1 tablet (10 mg total) by mouth daily., Disp: 90 tablet, Rfl: 3   furosemide (LASIX) 40 MG tablet, Take 1 tablet (40 mg total) by mouth every other day., Disp: 90 tablet, Rfl: 1   isosorbide mononitrate (IMDUR) 60 MG 24 hr tablet, TAKE 1 TABLET(60 MG) BY MOUTH DAILY, Disp: 90 tablet, Rfl: 3   ketorolac (ACULAR) 0.5 % ophthalmic solution, Place 1 drop into the right eye 3 (three) times daily., Disp: , Rfl:    Lancets (ONETOUCH DELICA PLUS [id number]) MISC, CHECK BLOOD SUGAR BEFORE BREAKFAST AND DINNER, Disp: 100 each, Rfl: 1   montelukast (SINGULAIR) 10 MG tablet, TAKE 1 TABLET(10 MG) BY MOUTH EVERY EVENING, Disp: 90 tablet, Rfl: 1   Nebulizers (COMPRESSOR/NEBULIZER) MISC, Use as directed, Disp: 1 each, Rfl: 0   nitroGLYCERIN (NITROSTAT) 0.4 MG SL tablet, Place 1 tablet (0.4 mg total) under the tongue every 5 (five) minutes as needed for chest pain., Disp: 25 tablet, Rfl: prn   pantoprazole (PROTONIX) 40 MG tablet, Take 40 mg by mouth daily., Disp: , Rfl:    Polyvinyl Alcohol-Povidone PF (REFRESH) 1.4-0.6 % SOLN, Place 1-2 drops into both eyes 3 (three) times daily as needed (dry/irritated eyes.)., Disp: , Rfl:    potassium chloride SA (KLOR-CON) 20 MEQ tablet, Take 1 tablet (20 mEq total)  by mouth every other day., Disp: 90 tablet, Rfl: 1   prednisoLONE acetate (PRED FORTE) 1 % ophthalmic suspension, Place 1 drop into both eyes 4 (four) times daily. , Disp: , Rfl:    pregabalin (LYRICA) 75 MG capsule, TAKE 1 CAPSULE(75 MG) BY MOUTH TWICE DAILY, Disp: 180 capsule, Rfl: 1   sitaGLIPtin-metformin (JANUMET) 50-500 MG tablet, Take 1 tablet by mouth 2 (two) times daily with a meal., Disp: 180 tablet, Rfl: 1   topiramate (TOPAMAX) 25 MG tablet, Take 1 tablet (25 mg total) by mouth 2 (two) times daily., Disp: 60 tablet, Rfl: 1   Travoprost, BAK Free, (TRAVATAN) 0.004 % SOLN ophthalmic solution, Place 1 drop  into the right eye at bedtime. , Disp: , Rfl:    umeclidinium-vilanterol (ANORO ELLIPTA) 62.5-25 MCG/INH AEPB, Inhale 1 puff into the lungs daily., Disp: 14 each, Rfl: 0  Past Medical History: Past Medical History:  Diagnosis Date   Anemia    3 months ago anemic   Anxiety    on meds   Arthritis    "all over" (01/15/2017)   Asthma    Bronchitis with emphysema    Chest pain    Chronic bronchitis ([place])    Coronary artery disease    a. 01/2017 she underwent orbital atherectomy/DES to the proxmal LAD and PTCA to ostial D2. 2D Echo 01/15/17 showed mild LVH, EF 60-65%, grade 1 DD.   Family history of anesthesia complication    daughter N/V   Fibromyalgia    GERD (gastroesophageal reflux disease)    on meds   Glaucoma    Heart murmur    History of hiatal hernia    Hx of echocardiogram    Echo (03/2013):  Tech limited; Mild focal basal septal hypertrophy, EF 60-65%, normal RVF   Hyperlipidemia    Hypertension    Nonsustained ventricular tachycardia (HCC)    OSA on CPAP    Premature atrial contractions    PVC (premature ventricular contraction)    a. Holter 12/16: NSR, occ PAC,PVCs   Sarcoidosis    Type II diabetes mellitus (HCC)     Tobacco Use: Social History   Tobacco Use  Smoking Status Former   Packs/day: 1.00   Years: 4.00   Pack years: 4.00   Types: Cigarettes   Quit date: 02/11/1980   Years since quitting: 40.7  Smokeless Tobacco Never    Labs: Recent Review Flowsheet Data     Labs for ITP Cardiac and Pulmonary Rehab Latest Ref Rng & Units 08/02/2019 11/28/2019 03/28/2020 04/03/2020 08/01/2020   Cholestrol 100 - 199 mg/dL - - - 152 -   LDLCALC 0 - 99 mg/dL - - - 89 -   HDL >39 mg/dL - - - 44 -   Trlycerides 0 - 149 mg/dL - - - 104 -   Hemoglobin A1c 4.8 - 5.6 % 6.2(H) 6.3(H) 6.1(H) 6.0(H) 6.1(H)   PHART 7.350 - 7.450 - - - - -   PCO2ART 32.0 - 48.0 mmHg - - - - -   HCO3 20.0 - 28.0 mmol/L - - - - -   TCO2 22 - 32 mmol/L - - - - -   O2SAT % - - - - -        Capillary Blood Glucose: Lab Results  Component Value Date   GLUCAP 80 10/18/2020   GLUCAP 130 (H) 10/18/2020   GLUCAP 103 (H) 10/16/2020   GLUCAP 155 (H) 10/16/2020   GLUCAP 118 (H) 01/10/2020    POCT  Glucose     Row Name 10/16/20 1202             POCT Blood Glucose   Pre-Exercise 155 mg/dL       Post-Exercise 130 mg/dL                Pulmonary Assessment Scores:  Pulmonary Assessment Scores     Row Name 10/08/20 1142         ADL UCSD   ADL Phase Entry     SOB Score total 50           CAT Score   CAT Score 29           mMRC Score   mMRC Score 4             UCSD: Self-administered rating of dyspnea associated with activities of daily living (ADLs) 6-point scale (0 = "not at all" to 5 = "maximal or unable to do because of breathlessness")  Scoring Scores range from 0 to 120.  Minimally important difference is 5 units  CAT: CAT can identify the health impairment of COPD patients and is better correlated with disease progression.  CAT has a scoring range of zero to 40. The CAT score is classified into four groups of low (less than 10), medium (10 - 20), high (21-30) and very high (31-40) based on the impact level of disease on health status. A CAT score over 10 suggests significant symptoms.  A worsening CAT score could be explained by an exacerbation, poor medication adherence, poor inhaler technique, or progression of COPD or comorbid conditions.  CAT MCID is 2 points  mMRC: mMRC (Modified Medical Research Council) Dyspnea Scale is used to assess the degree of baseline functional disability in patients of respiratory disease due to dyspnea. No minimal important difference is established. A decrease in score of 1 point or greater is considered a positive change.   Pulmonary Function Assessment:  Pulmonary Function Assessment - 10/08/20 1019       Breath   Bilateral Breath Sounds Clear    Shortness of Breath Yes;Limiting activity              Exercise Target Goals: Exercise Program Goal: Individual exercise prescription set using results from initial 6 min walk test and THRR while considering  patient's activity barriers and safety.   Exercise Prescription Goal: Initial exercise prescription builds to 30-45 minutes a day of aerobic activity, 2-3 days per week.  Home exercise guidelines will be given to patient during program as part of exercise prescription that the participant will acknowledge.  Activity Barriers & Risk Stratification:  Activity Barriers & Cardiac Risk Stratification - 10/08/20 1017       Activity Barriers & Cardiac Risk Stratification   Activity Barriers Arthritis;Joint Problems;Deconditioning;Muscular Weakness;Shortness of Breath;Balance Concerns;History of Falls             6 Minute Walk:  6 Minute Walk     Row Name 10/08/20 1105         6 Minute Walk   Phase Initial     Distance 935 feet  Used rollator     Walk Time 6 minutes     # of Rest Breaks 0     MPH 1.77     METS 1.8     RPE 14     Perceived Dyspnea  2     VO2 Peak 6.3     Resting HR 89 bpm     Resting BP  122/70     Resting Oxygen Saturation  99 %     Exercise Oxygen Saturation  during 6 min walk 98 %     Max Ex. HR 104 bpm     Max Ex. BP 130/80     2 Minute Post BP 124/70           Interval HR   1 Minute HR 96     2 Minute HR 100     3 Minute HR 101     4 Minute HR 104     5 Minute HR 102     6 Minute HR 102     2 Minute Post HR 69     Interval Heart Rate? Yes           Interval Oxygen   Interval Oxygen? Yes     Baseline Oxygen Saturation % 99 %     1 Minute Oxygen Saturation % 100 %     1 Minute Liters of Oxygen 0 L     2 Minute Oxygen Saturation % 99 %     2 Minute Liters of Oxygen 0 L     3 Minute Oxygen Saturation % 98 %     3 Minute Liters of Oxygen 0 L     4 Minute Oxygen Saturation % 100 %     4 Minute Liters of Oxygen 0 L     5 Minute Oxygen Saturation % 100 %     5 Minute Liters of Oxygen 0 L      6 Minute Oxygen Saturation % 98 %     6 Minute Liters of Oxygen 0 L     2 Minute Post Oxygen Saturation % 99 %     2 Minute Post Liters of Oxygen 0 L              Oxygen Initial Assessment:  Oxygen Initial Assessment - 10/08/20 1018       Home Oxygen   Home Oxygen Device None    Sleep Oxygen Prescription CPAP    Liters per minute 0    Home Exercise Oxygen Prescription None    Home Resting Oxygen Prescription None    Compliance with Home Oxygen Use Yes      Intervention   Short Term Goals To learn and understand importance of monitoring SPO2 with pulse oximeter and demonstrate accurate use of the pulse oximeter.;To learn and understand importance of maintaining oxygen saturations>88%;To learn and demonstrate proper pursed lip breathing techniques or other breathing techniques. ;To learn and demonstrate proper use of respiratory medications             Oxygen Re-Evaluation:  Oxygen Re-Evaluation     Row Name 10/19/20 0929             Home Oxygen   Home Oxygen Device None       Sleep Oxygen Prescription CPAP       Liters per minute 0       Home Exercise Oxygen Prescription None       Home Resting Oxygen Prescription None       Compliance with Home Oxygen Use Yes               Goals/Expected Outcomes   Short Term Goals To learn and understand importance of monitoring SPO2 with pulse oximeter and demonstrate accurate use of the pulse oximeter.;To learn and understand importance of maintaining oxygen saturations>88%;To learn and demonstrate proper pursed lip breathing techniques  or other breathing techniques. ;To learn and demonstrate proper use of respiratory medications       Goals/Expected Outcomes Compliance and understanding of monitoring oxygen saturation and importance of pursed lip breathing                Oxygen Discharge (Final Oxygen Re-Evaluation):  Oxygen Re-Evaluation - 10/19/20 0929       Home Oxygen   Home Oxygen Device None    Sleep  Oxygen Prescription CPAP    Liters per minute 0    Home Exercise Oxygen Prescription None    Home Resting Oxygen Prescription None    Compliance with Home Oxygen Use Yes      Goals/Expected Outcomes   Short Term Goals To learn and understand importance of monitoring SPO2 with pulse oximeter and demonstrate accurate use of the pulse oximeter.;To learn and understand importance of maintaining oxygen saturations>88%;To learn and demonstrate proper pursed lip breathing techniques or other breathing techniques. ;To learn and demonstrate proper use of respiratory medications    Goals/Expected Outcomes Compliance and understanding of monitoring oxygen saturation and importance of pursed lip breathing             Initial Exercise Prescription:  Initial Exercise Prescription - 10/08/20 1100       Date of Initial Exercise RX and Referring Provider   Date 10/08/20    Referring Provider Dr. Annamaria Boots    Expected Discharge Date 12/13/20      NuStep   Level 1    SPM 70    Minutes 15      Arm Ergometer   Level 1    Watts 10    Minutes 15      Prescription Details   Frequency (times per week) 2    Duration Progress to 30 minutes of continuous aerobic without signs/symptoms of physical distress      Intensity   THRR 40-80% of Max Heartrate 59-118    Ratings of Perceived Exertion 11-13    Perceived Dyspnea 0-4      Progression   Progression Continue to progress workloads to maintain intensity without signs/symptoms of physical distress.      Resistance Training   Training Prescription Yes    Weight Red bands    Reps 10-15             Perform Capillary Blood Glucose checks as needed.  Exercise Prescription Changes:   Exercise Prescription Changes     Row Name 10/16/20 1100             Response to Exercise   Blood Pressure (Admit) 136/64       Blood Pressure (Exercise) 120/66       Blood Pressure (Exit) 100/60       Heart Rate (Admit) 83 bpm       Heart Rate  (Exercise) 100 bpm       Heart Rate (Exit) 80 bpm       Oxygen Saturation (Admit) 97 %       Oxygen Saturation (Exercise) 97 %       Oxygen Saturation (Exit) 99 %       Rating of Perceived Exertion (Exercise) 11       Perceived Dyspnea (Exercise) 2       Duration Progress to 30 minutes of  aerobic without signs/symptoms of physical distress       Intensity Other (comment)  40-80% of HRR  Progression   Progression Continue to progress workloads to maintain intensity without signs/symptoms of physical distress.               Resistance Training   Training Prescription Yes       Weight Red bands       Reps 10-15       Time 10 Minutes               NuStep   Level 1       SPM 70       Minutes 15       METs 1.6               Arm Ergometer   Level 1       Watts 15       Minutes 15                Exercise Comments:   Exercise Comments     Row Name 10/16/20 1148           Exercise Comments Patient completed first day of exercise today. Pt tolerated arm ergometer and nustep well. She did 25 watts at level 1 on the arm ergometer for 15 minutes and 1.6 METS at level 1 on the nustep for 15 minutes. She did warm up and cool down exercises without any C/O.                Exercise Goals and Review:   Exercise Goals     Row Name 10/08/20 1128 10/19/20 0930           Exercise Goals   Increase Physical Activity Yes Yes      Intervention -- Provide advice, education, support and counseling about physical activity/exercise needs.;Develop an individualized exercise prescription for aerobic and resistive training based on initial evaluation findings, risk stratification, comorbidities and participant's personal goals.      Expected Outcomes Short Term: Attend rehab on a regular basis to increase amount of physical activity.;Long Term: Add in home exercise to make exercise part of routine and to increase amount of physical activity.;Long Term: Exercising  regularly at least 3-5 days a week. Short Term: Attend rehab on a regular basis to increase amount of physical activity.;Long Term: Add in home exercise to make exercise part of routine and to increase amount of physical activity.;Long Term: Exercising regularly at least 3-5 days a week.      Increase Strength and Stamina Yes Yes      Intervention -- Provide advice, education, support and counseling about physical activity/exercise needs.;Develop an individualized exercise prescription for aerobic and resistive training based on initial evaluation findings, risk stratification, comorbidities and participant's personal goals.      Expected Outcomes Short Term: Increase workloads from initial exercise prescription for resistance, speed, and METs.;Short Term: Perform resistance training exercises routinely during rehab and add in resistance training at home;Long Term: Improve cardiorespiratory fitness, muscular endurance and strength as measured by increased METs and functional capacity (6MWT) Short Term: Increase workloads from initial exercise prescription for resistance, speed, and METs.;Short Term: Perform resistance training exercises routinely during rehab and add in resistance training at home;Long Term: Improve cardiorespiratory fitness, muscular endurance and strength as measured by increased METs and functional capacity (6MWT)      Able to understand and use rate of perceived exertion (RPE) scale Yes Yes      Intervention Provide education and explanation on how to use RPE scale Provide education and explanation on how  to use RPE scale      Expected Outcomes Short Term: Able to use RPE daily in rehab to express subjective intensity level;Long Term:  Able to use RPE to guide intensity level when exercising independently Short Term: Able to use RPE daily in rehab to express subjective intensity level;Long Term:  Able to use RPE to guide intensity level when exercising independently      Able to  understand and use Dyspnea scale Yes Yes      Intervention Provide education and explanation on how to use Dyspnea scale Provide education and explanation on how to use Dyspnea scale      Expected Outcomes Short Term: Able to use Dyspnea scale daily in rehab to express subjective sense of shortness of breath during exertion;Long Term: Able to use Dyspnea scale to guide intensity level when exercising independently Short Term: Able to use Dyspnea scale daily in rehab to express subjective sense of shortness of breath during exertion;Long Term: Able to use Dyspnea scale to guide intensity level when exercising independently      Knowledge and understanding of Target Heart Rate Range (THRR) Yes Yes      Intervention Provide education and explanation of THRR including how the numbers were predicted and where they are located for reference Provide education and explanation of THRR including how the numbers were predicted and where they are located for reference      Expected Outcomes Short Term: Able to state/look up THRR;Short Term: Able to use daily as guideline for intensity in rehab;Long Term: Able to use THRR to govern intensity when exercising independently Short Term: Able to state/look up THRR;Short Term: Able to use daily as guideline for intensity in rehab;Long Term: Able to use THRR to govern intensity when exercising independently      Understanding of Exercise Prescription Yes Yes      Intervention Provide education, explanation, and written materials on patient's individual exercise prescription Provide education, explanation, and written materials on patient's individual exercise prescription      Expected Outcomes Short Term: Able to explain program exercise prescription;Long Term: Able to explain home exercise prescription to exercise independently Short Term: Able to explain program exercise prescription;Long Term: Able to explain home exercise prescription to exercise independently                Exercise Goals Re-Evaluation :  Exercise Goals Re-Evaluation     [place] Name 10/19/20 0931             Exercise Goal Re-Evaluation   Exercise Goals Review Increase Physical Activity;Increase Strength and Stamina;Able to understand and use rate of perceived exertion (RPE) scale;Able to understand and use Dyspnea scale;Knowledge and understanding of Target Heart Rate Range (THRR);Understanding of Exercise Prescription       Comments Colette has completed 2 exercise sessions. She is motivated to exercise and tolerates it well. Stephania has averaged 34 rpm at level 1 on the arm ergometer and 2.1 METs at level 1 on the Nustep. She performs the warmup and cooldown standing up. Will continue to monitor and progress as able.       Expected Outcomes Through exercise at rehab and home, Jia will decrease SOB with daily activities and feel confident in carrying out an exercise regimen at home.                Discharge Exercise Prescription (Final Exercise Prescription Changes):  Exercise Prescription Changes - 10/16/20 1100       Response to Exercise  Blood Pressure (Admit) 136/64    Blood Pressure (Exercise) 120/66    Blood Pressure (Exit) 100/60    Heart Rate (Admit) 83 bpm    Heart Rate (Exercise) 100 bpm    Heart Rate (Exit) 80 bpm    Oxygen Saturation (Admit) 97 %    Oxygen Saturation (Exercise) 97 %    Oxygen Saturation (Exit) 99 %    Rating of Perceived Exertion (Exercise) 11    Perceived Dyspnea (Exercise) 2    Duration Progress to 30 minutes of  aerobic without signs/symptoms of physical distress    Intensity Other (comment)   40-80% of HRR     Progression   Progression Continue to progress workloads to maintain intensity without signs/symptoms of physical distress.      Resistance Training   Training Prescription Yes    Weight Red bands    Reps 10-15    Time 10 Minutes      NuStep   Level 1    SPM 70    Minutes 15    METs 1.6      Arm Ergometer    Level 1    Watts 15    Minutes 15             Nutrition:  Target Goals: Understanding of nutrition guidelines, daily intake of sodium <1515m, cholesterol <2021m calories 30% from fat and 7% or less from saturated fats, daily to have 5 or more servings of fruits and vegetables.  Biometrics:  Pre Biometrics - 10/08/20 1017       Pre Biometrics   Height _0  (1.575 m)    Weight 80 kg    BMI (Calculated) 32.25    Grip Strength 16.5 kg              Nutrition Therapy Plan and Nutrition Goals:   Nutrition Assessments:  MEDIFICTS Score Key: ?70 Need to make dietary changes  40-70 Heart Healthy Diet ? 40 Therapeutic Level Cholesterol Diet   Picture Your Plate Scores: <4<23nhealthy dietary pattern with much room for improvement. 41-50 Dietary pattern unlikely to meet recommendations for good health and room for improvement. 51-60 More healthful dietary pattern, with some room for improvement.  >60 Healthy dietary pattern, although there may be some specific behaviors that could be improved.    Nutrition Goals Re-Evaluation:   Nutrition Goals Discharge (Final Nutrition Goals Re-Evaluation):   Psychosocial: Target Goals: Acknowledge presence or absence of significant depression and/or stress, maximize coping skills, provide positive support system. Participant is able to verbalize types and ability to use techniques and skills needed for reducing stress and depression.  Initial Review & Psychosocial Screening:  Initial Psych Review & Screening - 10/08/20 1019       Initial Review   Current issues with Current Depression;Current Stress Concerns    Source of Stress Concerns Family;Chronic Illness;Unable to participate in former interests or hobbies;Unable to perform yard/household activities    Comments Does not feel she needs therapy or medication for her depression.  She lost several family members and feels her depression has improved.      Family Dynamics    Good Support System? Yes      Barriers   Psychosocial barriers to participate in program The patient should benefit from training in stress management and relaxation.      Screening Interventions   Interventions Encouraged to exercise    Expected Outcomes Long Term Goal: Stressors or current issues are controlled or eliminated.  Quality of Life Scores:  Scores of 19 and below usually indicate a poorer quality of life in these areas.  A difference of  2-3 points is a clinically meaningful difference.  A difference of 2-3 points in the total score of the Quality of Life Index has been associated with significant improvement in overall quality of life, self-image, physical symptoms, and general health in studies assessing change in quality of life.  PHQ-9: Recent Review Flowsheet Data     Depression screen [facility] 2/9 10/08/2020 03/28/2020 03/23/2019 03/18/2018 03/18/2018   Decreased Interest 0 0 1 1 0   Down, Depressed, Hopeless 1 0 1 - 3   PHQ - 2 Score 1 0 _0 Altered sleeping 1  - 3 - 0   Tired, decreased energy 1 - 1 - 3   Change in appetite 0 - 0 - 0   Feeling bad or failure about yourself  0 - 0 - 0   Trouble concentrating 0 - 1 - 3   Moving slowly or fidgety/restless 0 - 0 - 0   Suicidal thoughts 0 - 0 - 0   PHQ-9 Score - - 7 - 9   Difficult doing work/chores - - Somewhat difficult  - -      Interpretation of Total Score  Total Score Depression Severity:  1-4 = Minimal depression, 5-9 = Mild depression, 10-14 = Moderate depression, 15-19 = Moderately severe depression, 20-27 = Severe depression   Psychosocial Evaluation and Intervention:  Psychosocial Evaluation - 10/08/20 1026       Psychosocial Evaluation & Interventions   Interventions Encouraged to exercise with the program and follow exercise prescription;Stress management education;Relaxation education    Comments Is still very involved in church and likes to go to football games, enjoys reading, but  does not like being able to do things she use to enjoy due to her chronic illnesses.    Expected Outcomes For Madina to handle her stress in healthy ways.    Continue Psychosocial Services  Follow up required by staff             Psychosocial Re-Evaluation:  Psychosocial Re-Evaluation     [place] Name 10/22/20 1024             Psychosocial Re-Evaluation   Current issues with Current Depression;Current Stress Concerns       Comments Aubrey's depression comes from the past loss of family members and the inability to perform activities that use to be easy for her.  Most days she does not let it bother her, but then others times she is very tired and rests more.  Her family does not always understand, she does what she feels like doing.  She has a positive outlook.       Expected Outcomes For Vieve to hand her depression and stress in healthy ways.  She does not want to be on antidepressants and talks with her minister if she needs to vent.       Interventions Encouraged to attend Pulmonary Rehabilitation for the exercise;Stress management education       Continue Psychosocial Services  Follow up required by staff               Initial Review   Source of Stress Concerns Chronic Illness;Unable to participate in former interests or hobbies;Unable to perform yard/household activities                Psychosocial Discharge (Final Psychosocial Re-Evaluation):  Psychosocial  Re-Evaluation - 10/22/20 1024       Psychosocial Re-Evaluation   Current issues with Current Depression;Current Stress Concerns    Comments Rainie's depression comes from the past loss of family members and the inability to perform activities that use to be easy for her.  Most days she does not let it bother her, but then others times she is very tired and rests more.  Her family does not always understand, she does what she feels like doing.  She has a positive outlook.    Expected Outcomes For Vieve to hand  her depression and stress in healthy ways.  She does not want to be on antidepressants and talks with her minister if she needs to vent.    Interventions Encouraged to attend Pulmonary Rehabilitation for the exercise;Stress management education    Continue Psychosocial Services  Follow up required by staff      Initial Review   Source of Stress Concerns Chronic Illness;Unable to participate in former interests or hobbies;Unable to perform yard/household activities             Education: Education Goals: Education classes will be provided on a weekly basis, covering required topics. Participant will state understanding/return demonstration of topics presented.  Learning Barriers/Preferences:  Learning Barriers/Preferences - 10/08/20 1040       Learning Barriers/Preferences   Learning Barriers None    Learning Preferences Skilled Demonstration;Pictoral;Individual Instruction;Group Instruction;Audio;Verbal Instruction;Video;Written Material             Education Topics: Risk Factor Reduction:  -Group instruction that is supported by a PowerPoint presentation. Instructor discusses the definition of a risk factor, different risk factors for pulmonary disease, and how the heart and lungs work together.   Flowsheet Row PULMONARY REHAB OTHER RESPIRATORY from 10/18/2020 in [place]  Date 10/18/20  Educator Handout       Nutrition for Pulmonary Patient:  -Group instruction provided by PowerPoint slides, verbal discussion, and written materials to support subject matter. The instructor gives an explanation and review of healthy diet recommendations, which includes a discussion on weight management, recommendations for fruit and vegetable consumption, as well as protein, fluid, caffeine, fiber, sodium, sugar, and alcohol. Tips for eating when patients are short of breath are discussed.   Pursed Lip Breathing:  -Group instruction that is supported by  demonstration and informational handouts. Instructor discusses the benefits of pursed lip and diaphragmatic breathing and detailed demonstration on how to preform both.     Oxygen Safety:  -Group instruction provided by PowerPoint, verbal discussion, and written material to support subject matter. There is an overview of "What is Oxygen" and "Why do we need it".  Instructor also reviews how to create a safe environment for oxygen use, the importance of using oxygen as prescribed, and the risks of noncompliance. There is a brief discussion on traveling with oxygen and resources the patient may utilize.   Oxygen Equipment:  -Group instruction provided by [facility] Staff utilizing handouts, written materials, and equipment demonstrations.   Signs and Symptoms:  -Group instruction provided by written material and verbal discussion to support subject matter. Warning signs and symptoms of infection, stroke, and heart attack are reviewed and when to call the physician/911 reinforced. Tips for preventing the spread of infection discussed.   Advanced Directives:  -Group instruction provided by verbal instruction and written material to support subject matter. Instructor reviews Advanced Directive laws and proper instruction for filling out document.   Pulmonary Video:  -Group  video education that reviews the importance of medication and oxygen compliance, exercise, good nutrition, pulmonary hygiene, and pursed lip and diaphragmatic breathing for the pulmonary patient.   Exercise for the Pulmonary Patient:  -Group instruction that is supported by a PowerPoint presentation. Instructor discusses benefits of exercise, core components of exercise, frequency, duration, and intensity of an exercise routine, importance of utilizing pulse oximetry during exercise, safety while exercising, and options of places to exercise outside of rehab.     Pulmonary Medications:  -Verbally interactive group education  provided by instructor with focus on inhaled medications and proper administration.   Anatomy and Physiology of the Respiratory System and Intimacy:  -Group instruction provided by PowerPoint, verbal discussion, and written material to support subject matter. Instructor reviews respiratory cycle and anatomical components of the respiratory system and their functions. Instructor also reviews differences in obstructive and restrictive respiratory diseases with examples of each. Intimacy, Sex, and Sexuality differences are reviewed with a discussion on how relationships can change when diagnosed with pulmonary disease. Common sexual concerns are reviewed.   MD DAY -A group question and answer session with a medical doctor that allows participants to ask questions that relate to their pulmonary disease state.   OTHER EDUCATION -Group or individual verbal, written, or video instructions that support the educational goals of the pulmonary rehab program. [place] from 05/27/2017 in [place]  Date 04/22/17  Instruction Review Code 2- Demonstrated Understanding       Holiday Eating Survival Tips:  -Group instruction provided by PowerPoint slides, verbal discussion, and written materials to support subject matter. The instructor gives patients tips, tricks, and techniques to help them not only survive but enjoy the holidays despite the onslaught of food that accompanies the holidays.   Knowledge Questionnaire Score:  Knowledge Questionnaire Score - 10/08/20 1143       Knowledge Questionnaire Score   Pre Score 15/18             Core Components/Risk Factors/Patient Goals at Admission:  Personal Goals and Risk Factors at Admission - 10/08/20 1145       Core Components/Risk Factors/Patient Goals on Admission   Improve shortness of breath with ADL's Yes    Intervention Provide education, individualized exercise plan and  daily activity instruction to help decrease symptoms of SOB with activities of daily living.    Expected Outcomes Short Term: Improve cardiorespiratory fitness to achieve a reduction of symptoms when performing ADLs;Long Term: Be able to perform more ADLs without symptoms or delay the onset of symptoms             Core Components/Risk Factors/Patient Goals Review:    Core Components/Risk Factors/Patient Goals at Discharge (Final Review):    ITP Comments:   Comments:

## 2020-10-23 NOTE — Progress Notes (Signed)
Sheila Reynolds 75 y.o. female Nutrition Note  Diagnosis: Sarcoidosis of the Lung  Past Medical History:  Diagnosis Date   Anemia    3 months ago anemic   Anxiety    on meds   Arthritis    "all over" (01/15/2017)   Asthma    Bronchitis with emphysema    Chest pain    Chronic bronchitis ([place])    Coronary artery disease    a. 01/2017 she underwent orbital atherectomy/DES to the proxmal LAD and PTCA to ostial D2. 2D Echo 01/15/17 showed mild LVH, EF 60-65%, grade 1 DD.   Family history of anesthesia complication    daughter N/V   Fibromyalgia    GERD (gastroesophageal reflux disease)    on meds   Glaucoma    Heart murmur    History of hiatal hernia    Hx of echocardiogram    Echo (03/2013):  Tech limited; Mild focal basal septal hypertrophy, EF 60-65%, normal RVF   Hyperlipidemia    Hypertension    Nonsustained ventricular tachycardia (HCC)    OSA on CPAP    Premature atrial contractions    PVC (premature ventricular contraction)    a. Holter 12/16: NSR, occ PAC,PVCs   Sarcoidosis    Type II diabetes mellitus ([place])      Medications reviewed.   Current Outpatient Medications:    acetaminophen (TYLENOL) 500 MG tablet, Take 1,000 mg by mouth 2 (two) times daily as needed for moderate pain or headache., Disp: , Rfl:    albuterol (PROVENTIL) (2.5 MG/3ML) 0.083% nebulizer solution, Take 3 mLs (2.5 mg total) by nebulization daily as needed for wheezing or shortness of breath., Disp: 75 mL, Rfl: 12   albuterol (VENTOLIN HFA) 108 (90 Base) MCG/ACT inhaler, Inhale 2 puffs into the lungs every 6 (six) hours as needed for wheezing or shortness of breath., Disp: 18 g, Rfl: 12   aspirin EC 81 MG tablet, Take 81 mg by mouth daily. Swallow whole., Disp: , Rfl:    atorvastatin (LIPITOR) 80 MG tablet, TAKE 1 TABLET(80 MG) BY MOUTH DAILY (Patient not taking: Reported on 10/08/2020), Disp: 90 tablet, Rfl: 1   Blood Glucose Monitoring Suppl (ACCU-CHEK AVIVA PLUS) w/Device KIT, Use to check  blood sugars 3 times a day. Dx code e11.65, Disp: 1 kit, Rfl: 3   brimonidine (ALPHAGAN P) 0.1 % SOLN, Place 1 drop into both eyes 2 (two) times daily. , Disp: , Rfl:    brinzolamide (AZOPT) 1 % ophthalmic suspension, Place 1 drop into both eyes 3 (three) times daily., Disp: , Rfl:    carvedilol (COREG) 12.5 MG tablet, TAKE 1 TABLET(12.5 MG) BY MOUTH TWICE DAILY, Disp: 180 tablet, Rfl: 3   clopidogrel (PLAVIX) 75 MG tablet, Take 1 tablet (75 mg total) by mouth daily., Disp: 90 tablet, Rfl: 3   EPINEPHrine 0.3 mg/0.3 mL IJ SOAJ injection, Inject 0.3 mg into the muscle as needed for anaphylaxis., Disp: 1 each, Rfl: 0   ezetimibe (ZETIA) 10 MG tablet, Take 1 tablet (10 mg total) by mouth daily., Disp: 90 tablet, Rfl: 3   furosemide (LASIX) 40 MG tablet, Take 1 tablet (40 mg total) by mouth every other day., Disp: 90 tablet, Rfl: 1   isosorbide mononitrate (IMDUR) 60 MG 24 hr tablet, TAKE 1 TABLET(60 MG) BY MOUTH DAILY, Disp: 90 tablet, Rfl: 3   ketorolac (ACULAR) 0.5 % ophthalmic solution, Place 1 drop into the right eye 3 (three) times daily., Disp: , Rfl:    Lancets (  ONETOUCH DELICA PLUS [id number]) MISC, CHECK BLOOD SUGAR BEFORE BREAKFAST AND DINNER, Disp: 100 each, Rfl: 1   montelukast (SINGULAIR) 10 MG tablet, TAKE 1 TABLET(10 MG) BY MOUTH EVERY EVENING, Disp: 90 tablet, Rfl: 1   Nebulizers (COMPRESSOR/NEBULIZER) MISC, Use as directed, Disp: 1 each, Rfl: 0   nitroGLYCERIN (NITROSTAT) 0.4 MG SL tablet, Place 1 tablet (0.4 mg total) under the tongue every 5 (five) minutes as needed for chest pain., Disp: 25 tablet, Rfl: prn   pantoprazole (PROTONIX) 40 MG tablet, Take 40 mg by mouth daily., Disp: , Rfl:    Polyvinyl Alcohol-Povidone PF (REFRESH) 1.4-0.6 % SOLN, Place 1-2 drops into both eyes 3 (three) times daily as needed (dry/irritated eyes.)., Disp: , Rfl:    potassium chloride SA (KLOR-CON) 20 MEQ tablet, Take 1 tablet (20 mEq total) by mouth every other day., Disp: 90 tablet, Rfl: 1    prednisoLONE acetate (PRED FORTE) 1 % ophthalmic suspension, Place 1 drop into both eyes 4 (four) times daily. , Disp: , Rfl:    pregabalin (LYRICA) 75 MG capsule, TAKE 1 CAPSULE(75 MG) BY MOUTH TWICE DAILY, Disp: 180 capsule, Rfl: 1   sitaGLIPtin-metformin (JANUMET) 50-500 MG tablet, Take 1 tablet by mouth 2 (two) times daily with a meal., Disp: 180 tablet, Rfl: 1   topiramate (TOPAMAX) 25 MG tablet, Take 1 tablet (25 mg total) by mouth 2 (two) times daily., Disp: 60 tablet, Rfl: 1   Travoprost, BAK Free, (TRAVATAN) 0.004 % SOLN ophthalmic solution, Place 1 drop into the right eye at bedtime. , Disp: , Rfl:    umeclidinium-vilanterol (ANORO ELLIPTA) 62.5-25 MCG/INH AEPB, Inhale 1 puff into the lungs daily., Disp: 14 each, Rfl: 0   Ht Readings from Last 1 Encounters:  10/08/20 5' 2"  (1.575 m)     Wt Readings from Last 3 Encounters:  10/16/20 175 lb 0.7 oz (79.4 kg)  10/08/20 176 lb 5.9 oz (80 kg)  09/12/20 173 lb 3.2 oz (78.6 kg)     There is no height or weight on file to calculate BMI.   Social History   Tobacco Use  Smoking Status Former   Packs/day: 1.00   Years: 4.00   Pack years: 4.00   Types: Cigarettes   Quit date: 02/11/1980   Years since quitting: 40.7  Smokeless Tobacco Never      Nutrition Note  Spoke with pt. Nutrition Plan and Nutrition Survey goals reviewed with pt.   Pt has Type 2 Diabetes. Last A1c indicates blood glucose well-controlled.   Pt with hx CAD and PCI and she reports bilateral leg edema. Per discussion, pt does use canned/convenience foods often. Pt does add salt to food. Pt does not eat out frequently. Reviewed ways to reduce sodium intake by reading labels (she cannot always see the labels d/t glaucoma but her husband can help her at the grocery store) and avoiding salt shaker/high sodium seasonings. She reports SOB when edema gets worse. She does not weigh herself daily because the scale is not in her bathroom.   Pt expressed understanding of  the information reviewed.    Nutrition Diagnosis Excessive sodium intake related to over consumption of processed food and salt as evidenced by frequent consumption of convenience food/ canned vegetables, use of salt shaker, bilateral leg edema,   Nutrition Intervention Pt's individual nutrition plan reviewed with pt. Benefits of adopting healthy diet reviewed with Picture My Plate survey   Pt given handouts for: ? Nutrition I class ? Nutrition II class ? Continue client-centered  nutrition education by RD, as part of interdisciplinary care.  Goal(s) Pt to identify and limit food sources of saturated fat, trans fat, refined carbohydrates and sodium Pt to build a healthy plate including vegetables, fruits, whole grains, and low-fat dairy products in a heart healthy meal plan. Pt to read labels to choose lower sodium foods   Plan:  Will provide client-centered nutrition education as part of interdisciplinary care Monitor and evaluate progress toward nutrition goal with team.   Michaele Offer, MS, RDN, LDN, CDCES

## 2020-10-25 ENCOUNTER — Encounter (HOSPITAL_COMMUNITY): Payer: Medicare Other

## 2020-10-26 DIAGNOSIS — H2512 Age-related nuclear cataract, left eye: Secondary | ICD-10-CM | POA: Diagnosis not present

## 2020-10-26 DIAGNOSIS — E119 Type 2 diabetes mellitus without complications: Secondary | ICD-10-CM | POA: Diagnosis not present

## 2020-10-29 NOTE — Progress Notes (Signed)
Pulmonary Individual Treatment Plan  Patient Details  Name: Sheila Reynolds MRN: [id number] Date of Birth: May 19, 1945 Referring Provider:   April Manson Pulmonary Rehab Walk Test from 10/08/2020 in [place]  Referring Provider Dr. 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[place])  Patient's Home Medications on Admission:   Current Outpatient Medications:    acetaminophen (TYLENOL) 500 MG tablet, Take 1,000 mg by mouth 2 (two) times daily as needed for moderate pain or headache., Disp: , Rfl:    albuterol (PROVENTIL) (2.5 MG/3ML) 0.083% nebulizer solution, Take 3 mLs (2.5 mg total) by nebulization daily as needed for wheezing or shortness of breath., Disp: 75 mL, Rfl: 12   albuterol (VENTOLIN HFA) 108 (90 Base) MCG/ACT inhaler, Inhale 2 puffs into the lungs every 6 (six) hours as needed for wheezing or shortness of breath., Disp: 18 g, Rfl: 12   aspirin EC 81 MG tablet, Take 81 mg by mouth daily. Swallow whole., Disp: , Rfl:    atorvastatin (LIPITOR) 80 MG tablet, TAKE 1 TABLET(80 MG) BY MOUTH DAILY (Patient not taking: Reported on 10/08/2020), Disp: 90 tablet, Rfl: 1   Blood Glucose Monitoring Suppl (ACCU-CHEK AVIVA PLUS) w/Device KIT, Use to check blood sugars 3 times a day. Dx code e11.65, Disp: 1 kit, Rfl: 3   brimonidine (ALPHAGAN P) 0.1 % SOLN, Place 1 drop into both eyes 2 (two) times daily. , Disp: , Rfl:    brinzolamide (AZOPT) 1 % ophthalmic suspension, Place 1 drop into both eyes 3 (three) times daily., Disp: , Rfl:    carvedilol (COREG) 12.5 MG tablet, TAKE 1 TABLET(12.5 MG) BY MOUTH TWICE DAILY, Disp: 180 tablet, Rfl: 3   clopidogrel (PLAVIX) 75 MG tablet, Take 1 tablet (75 mg total) by mouth daily., Disp: 90 tablet, Rfl: 3   EPINEPHrine 0.3 mg/0.3 mL IJ SOAJ injection, Inject 0.3 mg  into the muscle as needed for anaphylaxis., Disp: 1 each, Rfl: 0   ezetimibe (ZETIA) 10 MG tablet, Take 1 tablet (10 mg total) by mouth daily., Disp: 90 tablet, Rfl: 3   furosemide (LASIX) 40 MG tablet, Take 1 tablet (40 mg total) by mouth every other day., Disp: 90 tablet, Rfl: 1   isosorbide mononitrate (IMDUR) 60 MG 24 hr tablet, TAKE 1 TABLET(60 MG) BY MOUTH DAILY, Disp: 90 tablet, Rfl: 3   ketorolac (ACULAR) 0.5 % ophthalmic solution, Place 1 drop into the right eye 3 (three) times daily., Disp: , Rfl:    Lancets (ONETOUCH DELICA PLUS [id number]) MISC, CHECK BLOOD SUGAR BEFORE BREAKFAST AND DINNER, Disp: 100 each, Rfl: 1   montelukast (SINGULAIR) 10 MG tablet, TAKE 1 TABLET(10 MG) BY MOUTH EVERY EVENING, Disp: 90 tablet, Rfl: 1   Nebulizers (COMPRESSOR/NEBULIZER) MISC, Use as directed, Disp: 1 each, Rfl: 0   nitroGLYCERIN (NITROSTAT) 0.4 MG SL tablet, Place 1 tablet (0.4 mg total) under the tongue every 5 (five) minutes as needed for chest pain., Disp: 25 tablet, Rfl: prn   pantoprazole (PROTONIX) 40 MG tablet, Take 40 mg by mouth daily., Disp: , Rfl:    Polyvinyl Alcohol-Povidone PF (REFRESH) 1.4-0.6 % SOLN, Place 1-2 drops into both eyes 3 (three) times daily as needed (dry/irritated eyes.)., Disp: , Rfl:    potassium chloride SA (KLOR-CON) 20 MEQ tablet, Take 1 tablet (20 mEq total)  by mouth every other day., Disp: 90 tablet, Rfl: 1   prednisoLONE acetate (PRED FORTE) 1 % ophthalmic suspension, Place 1 drop into both eyes 4 (four) times daily. , Disp: , Rfl:    pregabalin (LYRICA) 75 MG capsule, TAKE 1 CAPSULE(75 MG) BY MOUTH TWICE DAILY, Disp: 180 capsule, Rfl: 1   sitaGLIPtin-metformin (JANUMET) 50-500 MG tablet, Take 1 tablet by mouth 2 (two) times daily with a meal., Disp: 180 tablet, Rfl: 1   topiramate (TOPAMAX) 25 MG tablet, Take 1 tablet (25 mg total) by mouth 2 (two) times daily., Disp: 60 tablet, Rfl: 1   Travoprost, BAK Free, (TRAVATAN) 0.004 % SOLN ophthalmic solution, Place 1 drop  into the right eye at bedtime. , Disp: , Rfl:    umeclidinium-vilanterol (ANORO ELLIPTA) 62.5-25 MCG/INH AEPB, Inhale 1 puff into the lungs daily., Disp: 14 each, Rfl: 0  Past Medical History: Past Medical History:  Diagnosis Date   Anemia    3 months ago anemic   Anxiety    on meds   Arthritis    "all over" (01/15/2017)   Asthma    Bronchitis with emphysema    Chest pain    Chronic bronchitis ([place])    Coronary artery disease    a. 01/2017 she underwent orbital atherectomy/DES to the proxmal LAD and PTCA to ostial D2. 2D Echo 01/15/17 showed mild LVH, EF 60-65%, grade 1 DD.   Family history of anesthesia complication    daughter N/V   Fibromyalgia    GERD (gastroesophageal reflux disease)    on meds   Glaucoma    Heart murmur    History of hiatal hernia    Hx of echocardiogram    Echo (03/2013):  Tech limited; Mild focal basal septal hypertrophy, EF 60-65%, normal RVF   Hyperlipidemia    Hypertension    Nonsustained ventricular tachycardia (HCC)    OSA on CPAP    Premature atrial contractions    PVC (premature ventricular contraction)    a. Holter 12/16: NSR, occ PAC,PVCs   Sarcoidosis    Type II diabetes mellitus (HCC)     Tobacco Use: Social History   Tobacco Use  Smoking Status Former   Packs/day: 1.00   Years: 4.00   Pack years: 4.00   Types: Cigarettes   Quit date: 02/11/1980   Years since quitting: 40.7  Smokeless Tobacco Never    Labs: Recent Review Flowsheet Data     Labs for ITP Cardiac and Pulmonary Rehab Latest Ref Rng & Units 08/02/2019 11/28/2019 03/28/2020 04/03/2020 08/01/2020   Cholestrol 100 - 199 mg/dL - - - 152 -   LDLCALC 0 - 99 mg/dL - - - 89 -   HDL >39 mg/dL - - - 44 -   Trlycerides 0 - 149 mg/dL - - - 104 -   Hemoglobin A1c 4.8 - 5.6 % 6.2(H) 6.3(H) 6.1(H) 6.0(H) 6.1(H)   PHART 7.350 - 7.450 - - - - -   PCO2ART 32.0 - 48.0 mmHg - - - - -   HCO3 20.0 - 28.0 mmol/L - - - - -   TCO2 22 - 32 mmol/L - - - - -   O2SAT % - - - - -        Capillary Blood Glucose: Lab Results  Component Value Date   GLUCAP 80 10/18/2020   GLUCAP 130 (H) 10/18/2020   GLUCAP 103 (H) 10/16/2020   GLUCAP 155 (H) 10/16/2020   GLUCAP 118 (H) 01/10/2020    POCT  Glucose     Row Name 10/16/20 1202             POCT Blood Glucose   Pre-Exercise 155 mg/dL       Post-Exercise 130 mg/dL                Pulmonary Assessment Scores:  Pulmonary Assessment Scores     Row Name 10/08/20 1142         ADL UCSD   ADL Phase Entry     SOB Score total 50           CAT Score   CAT Score 29           mMRC Score   mMRC Score 4             UCSD: Self-administered rating of dyspnea associated with activities of daily living (ADLs) 6-point scale (0 = "not at all" to 5 = "maximal or unable to do because of breathlessness")  Scoring Scores range from 0 to 120.  Minimally important difference is 5 units  CAT: CAT can identify the health impairment of COPD patients and is better correlated with disease progression.  CAT has a scoring range of zero to 40. The CAT score is classified into four groups of low (less than 10), medium (10 - 20), high (21-30) and very high (31-40) based on the impact level of disease on health status. A CAT score over 10 suggests significant symptoms.  A worsening CAT score could be explained by an exacerbation, poor medication adherence, poor inhaler technique, or progression of COPD or comorbid conditions.  CAT MCID is 2 points  mMRC: mMRC (Modified Medical Research Council) Dyspnea Scale is used to assess the degree of baseline functional disability in patients of respiratory disease due to dyspnea. No minimal important difference is established. A decrease in score of 1 point or greater is considered a positive change.   Pulmonary Function Assessment:  Pulmonary Function Assessment - 10/08/20 1019       Breath   Bilateral Breath Sounds Clear    Shortness of Breath Yes;Limiting activity              Exercise Target Goals: Exercise Program Goal: Individual exercise prescription set using results from initial 6 min walk test and THRR while considering  patient's activity barriers and safety.   Exercise Prescription Goal: Initial exercise prescription builds to 30-45 minutes a day of aerobic activity, 2-3 days per week.  Home exercise guidelines will be given to patient during program as part of exercise prescription that the participant will acknowledge.  Activity Barriers & Risk Stratification:  Activity Barriers & Cardiac Risk Stratification - 10/08/20 1017       Activity Barriers & Cardiac Risk Stratification   Activity Barriers Arthritis;Joint Problems;Deconditioning;Muscular Weakness;Shortness of Breath;Balance Concerns;History of Falls             6 Minute Walk:  6 Minute Walk     Row Name 10/08/20 1105         6 Minute Walk   Phase Initial     Distance 935 feet  Used rollator     Walk Time 6 minutes     # of Rest Breaks 0     MPH 1.77     METS 1.8     RPE 14     Perceived Dyspnea  2     VO2 Peak 6.3     Resting HR 89 bpm     Resting BP  122/70     Resting Oxygen Saturation  99 %     Exercise Oxygen Saturation  during 6 min walk 98 %     Max Ex. HR 104 bpm     Max Ex. BP 130/80     2 Minute Post BP 124/70           Interval HR   1 Minute HR 96     2 Minute HR 100     3 Minute HR 101     4 Minute HR 104     5 Minute HR 102     6 Minute HR 102     2 Minute Post HR 69     Interval Heart Rate? Yes           Interval Oxygen   Interval Oxygen? Yes     Baseline Oxygen Saturation % 99 %     1 Minute Oxygen Saturation % 100 %     1 Minute Liters of Oxygen 0 L     2 Minute Oxygen Saturation % 99 %     2 Minute Liters of Oxygen 0 L     3 Minute Oxygen Saturation % 98 %     3 Minute Liters of Oxygen 0 L     4 Minute Oxygen Saturation % 100 %     4 Minute Liters of Oxygen 0 L     5 Minute Oxygen Saturation % 100 %     5 Minute Liters of Oxygen 0 L      6 Minute Oxygen Saturation % 98 %     6 Minute Liters of Oxygen 0 L     2 Minute Post Oxygen Saturation % 99 %     2 Minute Post Liters of Oxygen 0 L              Oxygen Initial Assessment:  Oxygen Initial Assessment - 10/08/20 1018       Home Oxygen   Home Oxygen Device None    Sleep Oxygen Prescription CPAP    Liters per minute 0    Home Exercise Oxygen Prescription None    Home Resting Oxygen Prescription None    Compliance with Home Oxygen Use Yes      Intervention   Short Term Goals To learn and understand importance of monitoring SPO2 with pulse oximeter and demonstrate accurate use of the pulse oximeter.;To learn and understand importance of maintaining oxygen saturations>88%;To learn and demonstrate proper pursed lip breathing techniques or other breathing techniques. ;To learn and demonstrate proper use of respiratory medications             Oxygen Re-Evaluation:  Oxygen Re-Evaluation     Row Name 10/19/20 0929             Home Oxygen   Home Oxygen Device None       Sleep Oxygen Prescription CPAP       Liters per minute 0       Home Exercise Oxygen Prescription None       Home Resting Oxygen Prescription None       Compliance with Home Oxygen Use Yes               Goals/Expected Outcomes   Short Term Goals To learn and understand importance of monitoring SPO2 with pulse oximeter and demonstrate accurate use of the pulse oximeter.;To learn and understand importance of maintaining oxygen saturations>88%;To learn and demonstrate proper pursed lip breathing techniques  or other breathing techniques. ;To learn and demonstrate proper use of respiratory medications       Goals/Expected Outcomes Compliance and understanding of monitoring oxygen saturation and importance of pursed lip breathing                Oxygen Discharge (Final Oxygen Re-Evaluation):  Oxygen Re-Evaluation - 10/19/20 0929       Home Oxygen   Home Oxygen Device None    Sleep  Oxygen Prescription CPAP    Liters per minute 0    Home Exercise Oxygen Prescription None    Home Resting Oxygen Prescription None    Compliance with Home Oxygen Use Yes      Goals/Expected Outcomes   Short Term Goals To learn and understand importance of monitoring SPO2 with pulse oximeter and demonstrate accurate use of the pulse oximeter.;To learn and understand importance of maintaining oxygen saturations>88%;To learn and demonstrate proper pursed lip breathing techniques or other breathing techniques. ;To learn and demonstrate proper use of respiratory medications    Goals/Expected Outcomes Compliance and understanding of monitoring oxygen saturation and importance of pursed lip breathing             Initial Exercise Prescription:  Initial Exercise Prescription - 10/08/20 1100       Date of Initial Exercise RX and Referring Provider   Date 10/08/20    Referring Provider Dr. Annamaria Boots    Expected Discharge Date 12/13/20      NuStep   Level 1    SPM 70    Minutes 15      Arm Ergometer   Level 1    Watts 10    Minutes 15      Prescription Details   Frequency (times per week) 2    Duration Progress to 30 minutes of continuous aerobic without signs/symptoms of physical distress      Intensity   THRR 40-80% of Max Heartrate 59-118    Ratings of Perceived Exertion 11-13    Perceived Dyspnea 0-4      Progression   Progression Continue to progress workloads to maintain intensity without signs/symptoms of physical distress.      Resistance Training   Training Prescription Yes    Weight Red bands    Reps 10-15             Perform Capillary Blood Glucose checks as needed.  Exercise Prescription Changes:   Exercise Prescription Changes     Row Name 10/16/20 1100             Response to Exercise   Blood Pressure (Admit) 136/64       Blood Pressure (Exercise) 120/66       Blood Pressure (Exit) 100/60       Heart Rate (Admit) 83 bpm       Heart Rate  (Exercise) 100 bpm       Heart Rate (Exit) 80 bpm       Oxygen Saturation (Admit) 97 %       Oxygen Saturation (Exercise) 97 %       Oxygen Saturation (Exit) 99 %       Rating of Perceived Exertion (Exercise) 11       Perceived Dyspnea (Exercise) 2       Duration Progress to 30 minutes of  aerobic without signs/symptoms of physical distress       Intensity Other (comment)  40-80% of HRR  Progression   Progression Continue to progress workloads to maintain intensity without signs/symptoms of physical distress.               Resistance Training   Training Prescription Yes       Weight Red bands       Reps 10-15       Time 10 Minutes               NuStep   Level 1       SPM 70       Minutes 15       METs 1.6               Arm Ergometer   Level 1       Watts 15       Minutes 15                Exercise Comments:   Exercise Comments     Row Name 10/16/20 1148           Exercise Comments Patient completed first day of exercise today. Pt tolerated arm ergometer and nustep well. She did 25 watts at level 1 on the arm ergometer for 15 minutes and 1.6 METS at level 1 on the nustep for 15 minutes. She did warm up and cool down exercises without any C/O.                Exercise Goals and Review:   Exercise Goals     Row Name 10/08/20 1128 10/19/20 0930           Exercise Goals   Increase Physical Activity Yes Yes      Intervention -- Provide advice, education, support and counseling about physical activity/exercise needs.;Develop an individualized exercise prescription for aerobic and resistive training based on initial evaluation findings, risk stratification, comorbidities and participant's personal goals.      Expected Outcomes Short Term: Attend rehab on a regular basis to increase amount of physical activity.;Long Term: Add in home exercise to make exercise part of routine and to increase amount of physical activity.;Long Term: Exercising  regularly at least 3-5 days a week. Short Term: Attend rehab on a regular basis to increase amount of physical activity.;Long Term: Add in home exercise to make exercise part of routine and to increase amount of physical activity.;Long Term: Exercising regularly at least 3-5 days a week.      Increase Strength and Stamina Yes Yes      Intervention -- Provide advice, education, support and counseling about physical activity/exercise needs.;Develop an individualized exercise prescription for aerobic and resistive training based on initial evaluation findings, risk stratification, comorbidities and participant's personal goals.      Expected Outcomes Short Term: Increase workloads from initial exercise prescription for resistance, speed, and METs.;Short Term: Perform resistance training exercises routinely during rehab and add in resistance training at home;Long Term: Improve cardiorespiratory fitness, muscular endurance and strength as measured by increased METs and functional capacity (6MWT) Short Term: Increase workloads from initial exercise prescription for resistance, speed, and METs.;Short Term: Perform resistance training exercises routinely during rehab and add in resistance training at home;Long Term: Improve cardiorespiratory fitness, muscular endurance and strength as measured by increased METs and functional capacity (6MWT)      Able to understand and use rate of perceived exertion (RPE) scale Yes Yes      Intervention Provide education and explanation on how to use RPE scale Provide education and explanation on how  to use RPE scale      Expected Outcomes Short Term: Able to use RPE daily in rehab to express subjective intensity level;Long Term:  Able to use RPE to guide intensity level when exercising independently Short Term: Able to use RPE daily in rehab to express subjective intensity level;Long Term:  Able to use RPE to guide intensity level when exercising independently      Able to  understand and use Dyspnea scale Yes Yes      Intervention Provide education and explanation on how to use Dyspnea scale Provide education and explanation on how to use Dyspnea scale      Expected Outcomes Short Term: Able to use Dyspnea scale daily in rehab to express subjective sense of shortness of breath during exertion;Long Term: Able to use Dyspnea scale to guide intensity level when exercising independently Short Term: Able to use Dyspnea scale daily in rehab to express subjective sense of shortness of breath during exertion;Long Term: Able to use Dyspnea scale to guide intensity level when exercising independently      Knowledge and understanding of Target Heart Rate Range (THRR) Yes Yes      Intervention Provide education and explanation of THRR including how the numbers were predicted and where they are located for reference Provide education and explanation of THRR including how the numbers were predicted and where they are located for reference      Expected Outcomes Short Term: Able to state/look up THRR;Short Term: Able to use daily as guideline for intensity in rehab;Long Term: Able to use THRR to govern intensity when exercising independently Short Term: Able to state/look up THRR;Short Term: Able to use daily as guideline for intensity in rehab;Long Term: Able to use THRR to govern intensity when exercising independently      Understanding of Exercise Prescription Yes Yes      Intervention Provide education, explanation, and written materials on patient's individual exercise prescription Provide education, explanation, and written materials on patient's individual exercise prescription      Expected Outcomes Short Term: Able to explain program exercise prescription;Long Term: Able to explain home exercise prescription to exercise independently Short Term: Able to explain program exercise prescription;Long Term: Able to explain home exercise prescription to exercise independently                Exercise Goals Re-Evaluation :  Exercise Goals Re-Evaluation     [place] Name 10/19/20 0931             Exercise Goal Re-Evaluation   Exercise Goals Review Increase Physical Activity;Increase Strength and Stamina;Able to understand and use rate of perceived exertion (RPE) scale;Able to understand and use Dyspnea scale;Knowledge and understanding of Target Heart Rate Range (THRR);Understanding of Exercise Prescription       Comments Colette has completed 2 exercise sessions. She is motivated to exercise and tolerates it well. Stephania has averaged 34 rpm at level 1 on the arm ergometer and 2.1 METs at level 1 on the Nustep. She performs the warmup and cooldown standing up. Will continue to monitor and progress as able.       Expected Outcomes Through exercise at rehab and home, Jia will decrease SOB with daily activities and feel confident in carrying out an exercise regimen at home.                Discharge Exercise Prescription (Final Exercise Prescription Changes):  Exercise Prescription Changes - 10/16/20 1100       Response to Exercise  Blood Pressure (Admit) 136/64    Blood Pressure (Exercise) 120/66    Blood Pressure (Exit) 100/60    Heart Rate (Admit) 83 bpm    Heart Rate (Exercise) 100 bpm    Heart Rate (Exit) 80 bpm    Oxygen Saturation (Admit) 97 %    Oxygen Saturation (Exercise) 97 %    Oxygen Saturation (Exit) 99 %    Rating of Perceived Exertion (Exercise) 11    Perceived Dyspnea (Exercise) 2    Duration Progress to 30 minutes of  aerobic without signs/symptoms of physical distress    Intensity Other (comment)   40-80% of HRR     Progression   Progression Continue to progress workloads to maintain intensity without signs/symptoms of physical distress.      Resistance Training   Training Prescription Yes    Weight Red bands    Reps 10-15    Time 10 Minutes      NuStep   Level 1    SPM 70    Minutes 15    METs 1.6      Arm Ergometer    Level 1    Watts 15    Minutes 15             Nutrition:  Target Goals: Understanding of nutrition guidelines, daily intake of sodium <1585m, cholesterol <2085m calories 30% from fat and 7% or less from saturated fats, daily to have 5 or more servings of fruits and vegetables.  Biometrics:  Pre Biometrics - 10/08/20 1017       Pre Biometrics   Height _0  (1.575 m)    Weight 80 kg    BMI (Calculated) 32.25    Grip Strength 16.5 kg              Nutrition Therapy Plan and Nutrition Goals:  Nutrition Therapy & Goals - 10/23/20 1206       Nutrition Therapy   Diet TLC    Drug/Food Interactions Statins/Certain Fruits      Personal Nutrition Goals   Nutrition Goal Pt to identify and limit food sources of saturated fat, trans fat, refined carbohydrates and sodium    Personal Goal #2 Pt to build a healthy plate including vegetables, fruits, whole grains, and low-fat dairy products in a heart healthy meal plan.    Personal Goal #3 Pt to read labels to choose lower sodium foods      Intervention Plan   Intervention Prescribe, educate and counsel regarding individualized specific dietary modifications aiming towards targeted core components such as weight, hypertension, lipid management, diabetes, heart failure and other comorbidities.;Nutrition handout(s) given to patient.    Expected Outcomes Short Term Goal: A plan has been developed with personal nutrition goals set during dietitian appointment.;Long Term Goal: Adherence to prescribed nutrition plan.             Nutrition Assessments:  MEDIFICTS Score Key: ?70 Need to make dietary changes  40-70 Heart Healthy Diet ? 40 Therapeutic Level Cholesterol Diet   Picture Your Plate Scores: <4<91nhealthy dietary pattern with much room for improvement. 41-50 Dietary pattern unlikely to meet recommendations for good health and room for improvement. 51-60 More healthful dietary pattern, with some room for improvement.   >60 Healthy dietary pattern, although there may be some specific behaviors that could be improved.    Nutrition Goals Re-Evaluation:  Nutrition Goals Re-Evaluation     [place] 10/23/20 1207  Goals   Current Weight 175 lb (79.4 kg)       Nutrition Goal Pt to identify and limit food sources of saturated fat, trans fat, refined carbohydrates and sodium               Personal Goal #2 Re-Evaluation   Personal Goal #2 Pt to build a healthy plate including vegetables, fruits, whole grains, and low-fat dairy products in a heart healthy meal plan.               Personal Goal #3 Re-Evaluation   Personal Goal #3 Pt to read labels to choose lower sodium foods                Nutrition Goals Discharge (Final Nutrition Goals Re-Evaluation):  Nutrition Goals Re-Evaluation - 10/23/20 1207       Goals   Current Weight 175 lb (79.4 kg)    Nutrition Goal Pt to identify and limit food sources of saturated fat, trans fat, refined carbohydrates and sodium      Personal Goal #2 Re-Evaluation   Personal Goal #2 Pt to build a healthy plate including vegetables, fruits, whole grains, and low-fat dairy products in a heart healthy meal plan.      Personal Goal #3 Re-Evaluation   Personal Goal #3 Pt to read labels to choose lower sodium foods             Psychosocial: Target Goals: Acknowledge presence or absence of significant depression and/or stress, maximize coping skills, provide positive support system. Participant is able to verbalize types and ability to use techniques and skills needed for reducing stress and depression.  Initial Review & Psychosocial Screening:  Initial Psych Review & Screening - 10/08/20 1019       Initial Review   Current issues with Current Depression;Current Stress Concerns    Source of Stress Concerns Family;Chronic Illness;Unable to participate in former interests or hobbies;Unable to perform yard/household activities    Comments Does not  feel she needs therapy or medication for her depression.  She lost several family members and feels her depression has improved.      Family Dynamics   Good Support System? Yes      Barriers   Psychosocial barriers to participate in program The patient should benefit from training in stress management and relaxation.      Screening Interventions   Interventions Encouraged to exercise    Expected Outcomes Long Term Goal: Stressors or current issues are controlled or eliminated.             Quality of Life Scores:  Scores of 19 and below usually indicate a poorer quality of life in these areas.  A difference of  2-3 points is a clinically meaningful difference.  A difference of 2-3 points in the total score of the Quality of Life Index has been associated with significant improvement in overall quality of life, self-image, physical symptoms, and general health in studies assessing change in quality of life.  PHQ-9: Recent Review Flowsheet Data     Depression screen [facility] 2/9 10/08/2020 03/28/2020 03/23/2019 03/18/2018 03/18/2018   Decreased Interest 0 0 1 1 0   Down, Depressed, Hopeless 1 0 1 - 3   PHQ - 2 Score 1 0 _0 Altered sleeping 1  - 3 - 0   Tired, decreased energy 1 - 1 - 3   Change in appetite 0 - 0 - 0   Feeling bad or failure about  yourself  0 - 0 - 0   Trouble concentrating 0 - 1 - 3   Moving slowly or fidgety/restless 0 - 0 - 0   Suicidal thoughts 0 - 0 - 0   PHQ-9 Score - - 7 - 9   Difficult doing work/chores - - Somewhat difficult  - -      Interpretation of Total Score  Total Score Depression Severity:  1-4 = Minimal depression, 5-9 = Mild depression, 10-14 = Moderate depression, 15-19 = Moderately severe depression, 20-27 = Severe depression   Psychosocial Evaluation and Intervention:  Psychosocial Evaluation - 10/08/20 1026       Psychosocial Evaluation & Interventions   Interventions Encouraged to exercise with the program and follow exercise  prescription;Stress management education;Relaxation education    Comments Is still very involved in church and likes to go to football games, enjoys reading, but does not like being able to do things she use to enjoy due to her chronic illnesses.    Expected Outcomes For Ellon to handle her stress in healthy ways.    Continue Psychosocial Services  Follow up required by staff             Psychosocial Re-Evaluation:  Psychosocial Re-Evaluation     [place] Name 10/22/20 1024             Psychosocial Re-Evaluation   Current issues with Current Depression;Current Stress Concerns       Comments Medha's depression comes from the past loss of family members and the inability to perform activities that use to be easy for her.  Most days she does not let it bother her, but then others times she is very tired and rests more.  Her family does not always understand, she does what she feels like doing.  She has a positive outlook.       Expected Outcomes For Vieve to hand her depression and stress in healthy ways.  She does not want to be on antidepressants and talks with her minister if she needs to vent.       Interventions Encouraged to attend Pulmonary Rehabilitation for the exercise;Stress management education       Continue Psychosocial Services  Follow up required by staff               Initial Review   Source of Stress Concerns Chronic Illness;Unable to participate in former interests or hobbies;Unable to perform yard/household activities                Psychosocial Discharge (Final Psychosocial Re-Evaluation):  Psychosocial Re-Evaluation - 10/22/20 1024       Psychosocial Re-Evaluation   Current issues with Current Depression;Current Stress Concerns    Comments Samanvitha's depression comes from the past loss of family members and the inability to perform activities that use to be easy for her.  Most days she does not let it bother her, but then others times she is very  tired and rests more.  Her family does not always understand, she does what she feels like doing.  She has a positive outlook.    Expected Outcomes For Vieve to hand her depression and stress in healthy ways.  She does not want to be on antidepressants and talks with her minister if she needs to vent.    Interventions Encouraged to attend Pulmonary Rehabilitation for the exercise;Stress management education    Continue Psychosocial Services  Follow up required by staff      Initial Review  Source of Stress Concerns Chronic Illness;Unable to participate in former interests or hobbies;Unable to perform yard/household activities             Education: Education Goals: Education classes will be provided on a weekly basis, covering required topics. Participant will state understanding/return demonstration of topics presented.  Learning Barriers/Preferences:  Learning Barriers/Preferences - 10/08/20 1040       Learning Barriers/Preferences   Learning Barriers None    Learning Preferences Skilled Demonstration;Pictoral;Individual Instruction;Group Instruction;Audio;Verbal Instruction;Video;Written Material             Education Topics: Risk Factor Reduction:  -Group instruction that is supported by a PowerPoint presentation. Instructor discusses the definition of a risk factor, different risk factors for pulmonary disease, and how the heart and lungs work together.   Flowsheet Row PULMONARY REHAB OTHER RESPIRATORY from 10/18/2020 in [place]  Date 10/18/20  Educator Handout       Nutrition for Pulmonary Patient:  -Group instruction provided by PowerPoint slides, verbal discussion, and written materials to support subject matter. The instructor gives an explanation and review of healthy diet recommendations, which includes a discussion on weight management, recommendations for fruit and vegetable consumption, as well as protein, fluid, caffeine,  fiber, sodium, sugar, and alcohol. Tips for eating when patients are short of breath are discussed.   Pursed Lip Breathing:  -Group instruction that is supported by demonstration and informational handouts. Instructor discusses the benefits of pursed lip and diaphragmatic breathing and detailed demonstration on how to preform both.     Oxygen Safety:  -Group instruction provided by PowerPoint, verbal discussion, and written material to support subject matter. There is an overview of "What is Oxygen" and "Why do we need it".  Instructor also reviews how to create a safe environment for oxygen use, the importance of using oxygen as prescribed, and the risks of noncompliance. There is a brief discussion on traveling with oxygen and resources the patient may utilize.   Oxygen Equipment:  -Group instruction provided by [facility] Staff utilizing handouts, written materials, and equipment demonstrations.   Signs and Symptoms:  -Group instruction provided by written material and verbal discussion to support subject matter. Warning signs and symptoms of infection, stroke, and heart attack are reviewed and when to call the physician/911 reinforced. Tips for preventing the spread of infection discussed.   Advanced Directives:  -Group instruction provided by verbal instruction and written material to support subject matter. Instructor reviews Advanced Directive laws and proper instruction for filling out document.   Pulmonary Video:  -Group video education that reviews the importance of medication and oxygen compliance, exercise, good nutrition, pulmonary hygiene, and pursed lip and diaphragmatic breathing for the pulmonary patient.   Exercise for the Pulmonary Patient:  -Group instruction that is supported by a PowerPoint presentation. Instructor discusses benefits of exercise, core components of exercise, frequency, duration, and intensity of an exercise routine, importance of utilizing pulse  oximetry during exercise, safety while exercising, and options of places to exercise outside of rehab.     Pulmonary Medications:  -Verbally interactive group education provided by instructor with focus on inhaled medications and proper administration.   Anatomy and Physiology of the Respiratory System and Intimacy:  -Group instruction provided by PowerPoint, verbal discussion, and written material to support subject matter. Instructor reviews respiratory cycle and anatomical components of the respiratory system and their functions. Instructor also reviews differences in obstructive and restrictive respiratory diseases with examples of each. Intimacy, Sex, and Sexuality  differences are reviewed with a discussion on how relationships can change when diagnosed with pulmonary disease. Common sexual concerns are reviewed.   MD DAY -A group question and answer session with a medical doctor that allows participants to ask questions that relate to their pulmonary disease state.   OTHER EDUCATION -Group or individual verbal, written, or video instructions that support the educational goals of the pulmonary rehab program. [place] from 05/27/2017 in [place]  Date 04/22/17  Instruction Review Code 2- Demonstrated Understanding       Holiday Eating Survival Tips:  -Group instruction provided by PowerPoint slides, verbal discussion, and written materials to support subject matter. The instructor gives patients tips, tricks, and techniques to help them not only survive but enjoy the holidays despite the onslaught of food that accompanies the holidays.   Knowledge Questionnaire Score:  Knowledge Questionnaire Score - 10/08/20 1143       Knowledge Questionnaire Score   Pre Score 15/18             Core Components/Risk Factors/Patient Goals at Admission:  Personal Goals and Risk Factors at Admission - 10/08/20 1145        Core Components/Risk Factors/Patient Goals on Admission   Improve shortness of breath with ADL's Yes    Intervention Provide education, individualized exercise plan and daily activity instruction to help decrease symptoms of SOB with activities of daily living.    Expected Outcomes Short Term: Improve cardiorespiratory fitness to achieve a reduction of symptoms when performing ADLs;Long Term: Be able to perform more ADLs without symptoms or delay the onset of symptoms             Core Components/Risk Factors/Patient Goals Review:    Core Components/Risk Factors/Patient Goals at Discharge (Final Review):    ITP Comments:   Comments: ITP REVIEW Pt is making expected progress toward pulmonary rehab goals after completing 3 sessions. Recommend continued exercise, life style modification, education, and utilization of breathing techniques to increase stamina and strength and decrease shortness of breath with exertion.

## 2020-10-30 ENCOUNTER — Other Ambulatory Visit: Payer: Self-pay

## 2020-10-30 ENCOUNTER — Encounter (HOSPITAL_COMMUNITY)
Admission: RE | Admit: 2020-10-30 | Discharge: 2020-10-30 | Disposition: A | Discharge status: Home / Self Care | Payer: Medicare Other | Source: Ambulatory Visit | Attending: Internal Medicine | Admitting: Internal Medicine

## 2020-10-30 DIAGNOSIS — Z7984 Long term (current) use of oral hypoglycemic drugs: Secondary | ICD-10-CM | POA: Diagnosis not present

## 2020-10-30 DIAGNOSIS — D860 Sarcoidosis of lung: Secondary | ICD-10-CM | POA: Diagnosis not present

## 2020-10-30 DIAGNOSIS — Z79899 Other long term (current) drug therapy: Secondary | ICD-10-CM | POA: Diagnosis not present

## 2020-10-30 DIAGNOSIS — Z7902 Long term (current) use of antithrombotics/antiplatelets: Secondary | ICD-10-CM | POA: Diagnosis not present

## 2020-10-30 DIAGNOSIS — Z7951 Long term (current) use of inhaled steroids: Secondary | ICD-10-CM | POA: Diagnosis not present

## 2020-10-30 NOTE — Progress Notes (Signed)
Daily Session Note  Patient Details  Name: Sheila Reynolds MRN: [id number] Date of Birth: 1945/05/24 Referring Provider:   April Manson Pulmonary Rehab Walk Test from 10/08/2020 in [place]  Referring Provider Dr. Annamaria Boots       Encounter Date: 10/23/2020  Check In:   Capillary Blood Glucose: No results found for this or any previous visit (from the past 24 hour(s)).   Exercise Prescription Changes - 10/30/20 1200       Response to Exercise   Blood Pressure (Admit) 122/80    Blood Pressure (Exercise) 130/80    Blood Pressure (Exit) 126/78    Heart Rate (Admit) 84 bpm    Heart Rate (Exercise) 118 bpm    Heart Rate (Exit) 90 bpm    Oxygen Saturation (Admit) 100 %    Oxygen Saturation (Exercise) 97 %    Oxygen Saturation (Exit) 100 %    Rating of Perceived Exertion (Exercise) 10    Perceived Dyspnea (Exercise) 1    Duration Progress to 30 minutes of  aerobic without signs/symptoms of physical distress    Intensity THRR unchanged      Progression   Progression Continue to progress workloads to maintain intensity without signs/symptoms of physical distress.      Resistance Training   Training Prescription Yes    Weight Red bands    Reps 10-15    Time 10 Minutes      NuStep   Level 2    SPM 70    Minutes 15    METs 1.7      Arm Ergometer   Level 2    Watts 26    Minutes 15             Social History   Tobacco Use  Smoking Status Former   Packs/day: 1.00   Years: 4.00   Pack years: 4.00   Types: Cigarettes   Quit date: 02/11/1980   Years since quitting: 40.7  Smokeless Tobacco Never    Goals Met:  Proper associated with RPD/PD & O2 Sat Exercise tolerated well No report of concerns or symptoms today Strength training completed today  Goals Unmet:  Not Applicable  Comments: Service time is from 1015 to 29    Dr. Fransico Him is Medical Director for Cardiac Rehab at [facility].

## 2020-11-01 ENCOUNTER — Encounter (HOSPITAL_COMMUNITY)
Admission: RE | Admit: 2020-11-01 | Discharge: 2020-11-01 | Disposition: A | Discharge status: Home / Self Care | Payer: Medicare Other | Source: Ambulatory Visit | Attending: Internal Medicine | Admitting: Internal Medicine

## 2020-11-01 ENCOUNTER — Other Ambulatory Visit: Payer: Self-pay

## 2020-11-01 DIAGNOSIS — Z7951 Long term (current) use of inhaled steroids: Secondary | ICD-10-CM | POA: Diagnosis not present

## 2020-11-01 DIAGNOSIS — Z7902 Long term (current) use of antithrombotics/antiplatelets: Secondary | ICD-10-CM | POA: Diagnosis not present

## 2020-11-01 DIAGNOSIS — Z7984 Long term (current) use of oral hypoglycemic drugs: Secondary | ICD-10-CM | POA: Diagnosis not present

## 2020-11-01 DIAGNOSIS — D860 Sarcoidosis of lung: Secondary | ICD-10-CM | POA: Diagnosis not present

## 2020-11-01 DIAGNOSIS — Z79899 Other long term (current) drug therapy: Secondary | ICD-10-CM | POA: Diagnosis not present

## 2020-11-01 NOTE — Progress Notes (Signed)
Daily Session Note  Patient Details  Name: Sheila Reynolds MRN: [id number] Date of Birth: 1945/04/03 Referring Provider:   April Manson Pulmonary Rehab Walk Test from 10/08/2020 in [place]  Referring Provider Dr. Annamaria Boots       Encounter Date: 11/01/2020  Check In:  Session Check In - 11/01/20 1107       Check-In   Supervising physician immediately available to respond to emergencies [facility] immediately available    Physician(s) Dr. Florene Glen    Location [facility]    Staff Present Rosebud Poles, RN, Quentin Ore, MS, ACSM-CEP, Exercise Physiologist;Franziska Podgurski Ysidro Evert, RN    Virtual Visit No    Medication changes reported     No    Fall or balance concerns reported    No    Tobacco Cessation No Change    Warm-up and Cool-down Performed as group-led instruction    Resistance Training Performed Yes    VAD Patient? No    PAD/SET Patient? No      Pain Assessment   Currently in Pain? No/denies    Multiple Pain Sites No             Capillary Blood Glucose: No results found for this or any previous visit (from the past 24 hour(s)).    Social History   Tobacco Use  Smoking Status Former   Packs/day: 1.00   Years: 4.00   Pack years: 4.00   Types: Cigarettes   Quit date: 02/11/1980   Years since quitting: 40.7  Smokeless Tobacco Never    Goals Met:  Exercise tolerated well No report of concerns or symptoms today Strength training completed today  Goals Unmet:  Not Applicable  Comments: Service time is from 1015 to 1135    Dr. Fransico Him is Medical Director for Cardiac Rehab at [facility].

## 2020-11-06 ENCOUNTER — Other Ambulatory Visit: Payer: Self-pay

## 2020-11-06 ENCOUNTER — Encounter (HOSPITAL_COMMUNITY)
Admission: RE | Admit: 2020-11-06 | Discharge: 2020-11-06 | Disposition: A | Discharge status: Home / Self Care | Payer: Medicare Other | Source: Ambulatory Visit | Attending: Internal Medicine | Admitting: Internal Medicine

## 2020-11-06 ENCOUNTER — Ambulatory Visit: Payer: Medicare Other | Admitting: Internal Medicine

## 2020-11-06 DIAGNOSIS — D860 Sarcoidosis of lung: Secondary | ICD-10-CM | POA: Diagnosis not present

## 2020-11-06 DIAGNOSIS — Z7984 Long term (current) use of oral hypoglycemic drugs: Secondary | ICD-10-CM | POA: Diagnosis not present

## 2020-11-06 DIAGNOSIS — Z7902 Long term (current) use of antithrombotics/antiplatelets: Secondary | ICD-10-CM | POA: Diagnosis not present

## 2020-11-06 DIAGNOSIS — Z79899 Other long term (current) drug therapy: Secondary | ICD-10-CM | POA: Diagnosis not present

## 2020-11-06 DIAGNOSIS — Z7951 Long term (current) use of inhaled steroids: Secondary | ICD-10-CM | POA: Diagnosis not present

## 2020-11-06 NOTE — Progress Notes (Signed)
Daily Session Note  Patient Details  Name: Sheila Reynolds MRN: [id number] Date of Birth: Jun 13, 1945 Referring Provider:   April Manson Pulmonary Rehab Walk Test from 10/08/2020 in [place]  Referring Provider Dr. Annamaria Boots       Encounter Date: 11/06/2020  Check In:  Session Check In - 11/06/20 1033       Check-In   Supervising physician immediately available to respond to emergencies [facility] immediately available    Physician(s) Dr. Florene Glen    Location [facility]    Staff Present Rosebud Poles, RN, Quentin Ore, MS, ACSM-CEP, Exercise Physiologist;Josi Roediger Ysidro Evert, RN    Virtual Visit No    Medication changes reported     No    Fall or balance concerns reported    No    Tobacco Cessation No Change    Warm-up and Cool-down Performed as group-led instruction    Resistance Training Performed Yes    VAD Patient? No    PAD/SET Patient? No      Pain Assessment   Currently in Pain? No/denies    Multiple Pain Sites No             Capillary Blood Glucose: No results found for this or any previous visit (from the past 24 hour(s)).    Social History   Tobacco Use  Smoking Status Former   Packs/day: 1.00   Years: 4.00   Pack years: 4.00   Types: Cigarettes   Quit date: 02/11/1980   Years since quitting: 40.7  Smokeless Tobacco Never    Goals Met:  Exercise tolerated well No report of concerns or symptoms today Strength training completed today  Goals Unmet:  Not Applicable  Comments: Service time is from 1028 to 1135    Dr. Fransico Him is Medical Director for Cardiac Rehab at [facility].

## 2020-11-07 ENCOUNTER — Encounter: Payer: Self-pay | Admitting: Internal Medicine

## 2020-11-07 DIAGNOSIS — H2013 Chronic iridocyclitis, bilateral: Secondary | ICD-10-CM | POA: Diagnosis not present

## 2020-11-08 ENCOUNTER — Ambulatory Visit (INDEPENDENT_AMBULATORY_CARE_PROVIDER_SITE_OTHER): Payer: Medicare Other | Admitting: Internal Medicine

## 2020-11-08 ENCOUNTER — Encounter: Payer: Self-pay | Admitting: Internal Medicine

## 2020-11-08 ENCOUNTER — Other Ambulatory Visit: Payer: Self-pay

## 2020-11-08 ENCOUNTER — Encounter (HOSPITAL_COMMUNITY)
Admission: RE | Admit: 2020-11-08 | Discharge: 2020-11-08 | Disposition: A | Discharge status: Home / Self Care | Payer: Medicare Other | Source: Ambulatory Visit | Attending: Internal Medicine | Admitting: Internal Medicine

## 2020-11-08 ENCOUNTER — Telehealth: Payer: Medicare Other

## 2020-11-08 ENCOUNTER — Telehealth: Payer: Self-pay

## 2020-11-08 VITALS — BP 120/76 | HR 78 | Temp 98.1°F | Ht 62.0 in | Wt 179.4 lb

## 2020-11-08 DIAGNOSIS — I25119 Atherosclerotic heart disease of native coronary artery with unspecified angina pectoris: Secondary | ICD-10-CM

## 2020-11-08 DIAGNOSIS — I110 Hypertensive heart disease with heart failure: Secondary | ICD-10-CM | POA: Diagnosis not present

## 2020-11-08 DIAGNOSIS — Z23 Encounter for immunization: Secondary | ICD-10-CM

## 2020-11-08 DIAGNOSIS — E7800 Pure hypercholesterolemia, unspecified: Secondary | ICD-10-CM

## 2020-11-08 DIAGNOSIS — D860 Sarcoidosis of lung: Secondary | ICD-10-CM | POA: Diagnosis not present

## 2020-11-08 DIAGNOSIS — Z79899 Other long term (current) drug therapy: Secondary | ICD-10-CM | POA: Diagnosis not present

## 2020-11-08 DIAGNOSIS — Z8616 Personal history of COVID-19: Secondary | ICD-10-CM

## 2020-11-08 DIAGNOSIS — I5032 Chronic diastolic (congestive) heart failure: Secondary | ICD-10-CM

## 2020-11-08 DIAGNOSIS — Z7951 Long term (current) use of inhaled steroids: Secondary | ICD-10-CM | POA: Diagnosis not present

## 2020-11-08 DIAGNOSIS — Z7902 Long term (current) use of antithrombotics/antiplatelets: Secondary | ICD-10-CM | POA: Diagnosis not present

## 2020-11-08 DIAGNOSIS — E559 Vitamin D deficiency, unspecified: Secondary | ICD-10-CM

## 2020-11-08 DIAGNOSIS — E1165 Type 2 diabetes mellitus with hyperglycemia: Secondary | ICD-10-CM

## 2020-11-08 DIAGNOSIS — Z7984 Long term (current) use of oral hypoglycemic drugs: Secondary | ICD-10-CM | POA: Diagnosis not present

## 2020-11-08 MED ORDER — ZOSTER VAC RECOMB ADJUVANTED 50 MCG/0.5ML IM SUSR: 0.5000 mL | Freq: Once | INTRAMUSCULAR | 0 refills | Status: AC

## 2020-11-08 MED ORDER — EZETIMIBE 10 MG PO TABS: 10.0000 mg | ORAL_TABLET | Freq: Every day | ORAL | 3 refills | Status: DC

## 2020-11-08 NOTE — Progress Notes (Signed)
[name],acting as a Education administrator for Maximino Greenland, MD.,have documented all relevant documentation on the behalf of Maximino Greenland, MD,as directed by  Maximino Greenland, MD while in the presence of Maximino Greenland, MD.  This visit occurred during the SARS-CoV-2 public health emergency.  Safety protocols were in place, including screening questions prior to the visit, additional usage of staff PPE, and extensive cleaning of exam room while observing appropriate contact time as indicated for disinfecting solutions.  Subjective:     Patient ID: Sheila Reynolds , female    DOB: December 01, 1945 , 75 y.o.   MRN: [id number]   Chief Complaint  Patient presents with   Diabetes   Hypertension   vitamin d    HPI  She presents today for DM/HTN check. She reports compliance with meds.   Diabetes She presents for her follow-up diabetic visit. She has type 2 diabetes mellitus. Her disease course has been stable. There are no hypoglycemic associated symptoms. Pertinent negatives for hypoglycemia include no dizziness or headaches. Pertinent negatives for diabetes include no blurred vision, no chest pain, no polydipsia, no polyphagia, no polyuria and no weakness. There are no hypoglycemic complications. Diabetic complications include heart disease. Her weight is stable. Her breakfast blood glucose is taken between 7-8 am. Her breakfast blood glucose range is generally 90-110 mg/dl. An ACE inhibitor/angiotensin II receptor blocker is being taken. Eye exam is current.  Hypertension This is a chronic problem. The current episode started more than 1 year ago. The problem has been gradually improving since onset. The problem is controlled. Pertinent negatives include no blurred vision, chest pain, headaches or palpitations. Risk factors for coronary artery disease include diabetes mellitus, dyslipidemia, obesity, post-menopausal state and sedentary lifestyle.    Past Medical History:  Diagnosis Date    Anemia    3 months ago anemic   Anxiety    on meds   Arthritis    "all over" (01/15/2017)   Asthma    Bronchitis with emphysema    Chest pain    Chronic bronchitis ([place])    Coronary artery disease    a. 01/2017 she underwent orbital atherectomy/DES to the proxmal LAD and PTCA to ostial D2. 2D Echo 01/15/17 showed mild LVH, EF 60-65%, grade 1 DD.   Family history of anesthesia complication    daughter N/V   Fibromyalgia    GERD (gastroesophageal reflux disease)    on meds   Glaucoma    Heart murmur    History of hiatal hernia    Hx of echocardiogram    Echo (03/2013):  Tech limited; Mild focal basal septal hypertrophy, EF 60-65%, normal RVF   Hyperlipidemia    Hypertension    Nonsustained ventricular tachycardia    OSA on CPAP    Premature atrial contractions    PVC (premature ventricular contraction)    a. Holter 12/16: NSR, occ PAC,PVCs   Sarcoidosis    Type II diabetes mellitus ([place])      Family History  Problem Relation Age of Onset   Heart disease Father    Diabetes Father    Glaucoma Father    Cancer Mother        unknown type, ?lung   Heart disease Paternal Grandmother    Cancer Paternal Grandmother        unknown type   Diabetes Brother    Glaucoma Brother      Current Outpatient Medications:    acetaminophen (TYLENOL) 500 MG tablet, Take 1,000 mg  by mouth 2 (two) times daily as needed for moderate pain or headache., Disp: , Rfl:    albuterol (PROVENTIL) (2.5 MG/3ML) 0.083% nebulizer solution, Take 3 mLs (2.5 mg total) by nebulization daily as needed for wheezing or shortness of breath., Disp: 75 mL, Rfl: 12   albuterol (VENTOLIN HFA) 108 (90 Base) MCG/ACT inhaler, Inhale 2 puffs into the lungs every 6 (six) hours as needed for wheezing or shortness of breath., Disp: 18 g, Rfl: 12   aspirin EC 81 MG tablet, Take 81 mg by mouth daily. Swallow whole., Disp: , Rfl:    Blood Glucose Monitoring Suppl (ACCU-CHEK AVIVA PLUS) w/Device KIT, Use to check blood sugars 3  times a day. Dx code e11.65, Disp: 1 kit, Rfl: 3   brimonidine (ALPHAGAN P) 0.1 % SOLN, Place 1 drop into both eyes 2 (two) times daily. , Disp: , Rfl:    brinzolamide (AZOPT) 1 % ophthalmic suspension, Place 1 drop into both eyes 3 (three) times daily., Disp: , Rfl:    carvedilol (COREG) 12.5 MG tablet, TAKE 1 TABLET(12.5 MG) BY MOUTH TWICE DAILY, Disp: 180 tablet, Rfl: 3   clopidogrel (PLAVIX) 75 MG tablet, Take 1 tablet (75 mg total) by mouth daily., Disp: 90 tablet, Rfl: 3   EPINEPHrine 0.3 mg/0.3 mL IJ SOAJ injection, Inject 0.3 mg into the muscle as needed for anaphylaxis., Disp: 1 each, Rfl: 0   furosemide (LASIX) 40 MG tablet, Take 1 tablet (40 mg total) by mouth every other day., Disp: 90 tablet, Rfl: 1   isosorbide mononitrate (IMDUR) 60 MG 24 hr tablet, TAKE 1 TABLET(60 MG) BY MOUTH DAILY, Disp: 90 tablet, Rfl: 3   ketorolac (ACULAR) 0.5 % ophthalmic solution, Place 1 drop into the right eye 3 (three) times daily., Disp: , Rfl:    Lancets (ONETOUCH DELICA PLUS [id number]) MISC, CHECK BLOOD SUGAR BEFORE BREAKFAST AND DINNER, Disp: 100 each, Rfl: 1   montelukast (SINGULAIR) 10 MG tablet, TAKE 1 TABLET(10 MG) BY MOUTH EVERY EVENING, Disp: 90 tablet, Rfl: 1   Nebulizers (COMPRESSOR/NEBULIZER) MISC, Use as directed, Disp: 1 each, Rfl: 0   pantoprazole (PROTONIX) 40 MG tablet, Take 40 mg by mouth daily., Disp: , Rfl:    Polyvinyl Alcohol-Povidone PF (REFRESH) 1.4-0.6 % SOLN, Place 1-2 drops into both eyes 3 (three) times daily as needed (dry/irritated eyes.)., Disp: , Rfl:    potassium chloride SA (KLOR-CON) 20 MEQ tablet, Take 1 tablet (20 mEq total) by mouth every other day., Disp: 90 tablet, Rfl: 1   prednisoLONE acetate (PRED FORTE) 1 % ophthalmic suspension, Place 1 drop into both eyes 4 (four) times daily. , Disp: , Rfl:    pregabalin (LYRICA) 75 MG capsule, TAKE 1 CAPSULE(75 MG) BY MOUTH TWICE DAILY, Disp: 180 capsule, Rfl: 1   sitaGLIPtin-metformin (JANUMET) 50-500 MG tablet, Take 1  tablet by mouth 2 (two) times daily with a meal., Disp: 180 tablet, Rfl: 1   Travoprost, BAK Free, (TRAVATAN) 0.004 % SOLN ophthalmic solution, Place 1 drop into the right eye at bedtime. , Disp: , Rfl:    umeclidinium-vilanterol (ANORO ELLIPTA) 62.5-25 MCG/INH AEPB, Inhale 1 puff into the lungs daily., Disp: 14 each, Rfl: 0   ezetimibe (ZETIA) 10 MG tablet, Take 1 tablet (10 mg total) by mouth daily., Disp: 90 tablet, Rfl: 3   nitroGLYCERIN (NITROSTAT) 0.4 MG SL tablet, Place 1 tablet (0.4 mg total) under the tongue every 5 (five) minutes as needed for chest pain. (Patient not taking: Reported on 11/08/2020), Disp: 25 tablet,  Rfl: prn   topiramate (TOPAMAX) 25 MG tablet, Take 1 tablet (25 mg total) by mouth at bedtime., Disp: 120 tablet, Rfl: 3   Allergies  Allergen Reactions   Crestor [Rosuvastatin Calcium] Other (See Comments)    muscle aches   Demerol  [Meperidine Hcl]     Other reaction(s): Hallucinations   Shellfish Allergy Itching and Other (See Comments)    Crab, shrimp and lobster ---lips itch and tingle Was told not to eat again after having a allergy test. Lobster, crab and shrimp      Review of Systems  Constitutional: Negative.   Eyes:  Negative for blurred vision.  Respiratory: Negative.    Cardiovascular: Negative.  Negative for chest pain and palpitations.  Gastrointestinal: Negative.   Endocrine: Negative for polydipsia, polyphagia and polyuria.  Neurological: Negative.  Negative for dizziness, weakness and headaches.  Psychiatric/Behavioral: Negative.      Today's Vitals   11/08/20 1449  BP: 120/76  Pulse: 78  Temp: 98.1 F (36.7 C)  Weight: 179 lb 6.4 oz (81.4 kg)  Height: 5' 2"  (1.575 m)   Body mass index is 32.81 kg/m.   Objective:  Physical Exam Vitals and nursing note reviewed.  Constitutional:      Appearance: Normal appearance.  HENT:     Head: Normocephalic and atraumatic.     Nose:     Comments: Masked     Mouth/Throat:     Comments: Masked   Eyes:     Extraocular Movements: Extraocular movements intact.  Cardiovascular:     Rate and Rhythm: Normal rate and regular rhythm.     Heart sounds: Normal heart sounds.  Pulmonary:     Effort: Pulmonary effort is normal.     Breath sounds: Normal breath sounds.  Musculoskeletal:     Cervical back: Normal range of motion.  Skin:    General: Skin is warm.  Neurological:     General: No focal deficit present.     Mental Status: She is alert.  Psychiatric:        Mood and Affect: Mood normal.        Behavior: Behavior normal.        Assessment And Plan:     1. Uncontrolled type 2 diabetes mellitus with hyperglycemia (HCC) Comments: Chronic, I will check labs as listed below .Importance of medication and dietary compliance was d/w pt in detail.  - BMP8+EGFR - Hemoglobin A1c - AMB Referral to [place]  2. 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 heart failure ([place]) Comments: Chronic, well controlled. She is encouraged to follow low sodium diet.  - AMB Referral to [place]  3. Pure hypercholesterolemia Comments: Has yet to start Zetia as per Cardiology, I wil resend to the pharmacy.  4. Vitamin D deficiency disease Comments: I will check vitamin D level and supplement as needed.  - Vitamin D (25 hydroxy)  5. Immunization due Comments: She was given high dose flu vaccine today. I will send rx Shingrix to her local pharmacy.  - Flu Vaccine QUAD High Dose(Fluad) - Zoster Vaccine Adjuvanted [facility]) injection; Inject 0.5 mLs into the muscle once for 1 dose.  Dispense: 0.5 mL; Refill: 0  6. Personal history of COVID-19 Comments: She is fully vaccinated and boosted x 1.     Patient was given opportunity to ask questions. Patient verbalized understanding of the plan and was able to repeat key elements of the plan. All questions were answered to their satisfaction.  I, Maximino Greenland, MD, have reviewed all  documentation for this visit. The documentation on 11/19/20 for the exam, diagnosis, procedures, and orders are all accurate and complete.   IF YOU HAVE BEEN REFERRED TO A SPECIALIST, IT MAY TAKE 1-2 WEEKS TO SCHEDULE/PROCESS THE REFERRAL. IF YOU HAVE NOT HEARD FROM US/SPECIALIST IN TWO WEEKS, PLEASE GIVE [place] A CALL AT [phone] X [phone].   THE PATIENT IS ENCOURAGED TO PRACTICE SOCIAL DISTANCING DUE TO THE COVID-19 PANDEMIC.

## 2020-11-08 NOTE — Progress Notes (Signed)
Daily Session Note  Patient Details  Name: DISA RIEDLINGER MRN: [id number] Date of Birth: Aug 24, 1945 Referring Provider:   April Manson Pulmonary Rehab Walk Test from 10/08/2020 in [place]  Referring Provider Dr. Annamaria Boots       Encounter Date: 11/08/2020  Check In:  Session Check In - 11/08/20 1033       Check-In   Supervising physician immediately available to respond to emergencies [facility] immediately available    Physician(s) Dr. Gerlean Ren    Location [facility]    Staff Present Rosebud Poles, RN, Quentin Ore, MS, ACSM-CEP, Exercise Physiologist;Te[place] Ysidro Evert, RN    Virtual Visit No    Medication changes reported     No    Fall or balance concerns reported    No    Tobacco Cessation No Change    Warm-up and Cool-down Performed as group-led instruction    Resistance Training Performed Yes    VAD Patient? No    PAD/SET Patient? No      Pain Assessment   Currently in Pain? No/denies    Multiple Pain Sites No             Capillary Blood Glucose: No results found for this or any previous visit (from the past 24 hour(s)).    Social History   Tobacco Use  Smoking Status Former   Packs/day: 1.00   Years: 4.00   Pack years: 4.00   Types: Cigarettes   Quit date: 02/11/1980   Years since quitting: 40.7  Smokeless Tobacco Never    Goals Met:  Exercise tolerated well No report of concerns or symptoms today Strength training completed today  Goals Unmet:  Not Applicable  Comments: Service time is from 1015 to 1140    Dr. Fransico Him is Medical Director for Cardiac Rehab at [facility].

## 2020-11-08 NOTE — Telephone Encounter (Signed)
  Care Management   Follow Up Note   11/08/2020 Name: Sheila Reynolds MRN: [id number] DOB: 06/17/1945   Referred by: Glendale Chard, MD Reason for referral : Chronic Care Management (RN CM Follow up call )   An unsuccessful telephone outreach was attempted today. The patient was referred to the case management team for assistance with care management and care coordination. I spoke with [name] briefly today, unfortunately she is unavailable to speak with me due to she is driving to PCP office for a follow up visit.   Follow Up Plan: Telephone follow up appointment with care management team member scheduled for: 11/12/20  Barb Merino, RN, BSN, CCM Care Management Coordinator [place] [facility]  Direct Phone: [phone]

## 2020-11-08 NOTE — Patient Instructions (Signed)
Diabetes Mellitus and Nutrition, Adult When you have diabetes, or diabetes mellitus, it is very important to have healthy eating habits because your blood sugar (glucose) levels are greatly affected by what you eat and drink. Eating healthy foods in the right amounts, at about the same times every day, can help you:  Control your blood glucose.  Lower your risk of heart disease.  Improve your blood pressure.  Reach or maintain a healthy weight. What can affect my meal plan? Every person with diabetes is different, and each person has different needs for a meal plan. Your health care provider may recommend that you work with a dietitian to make a meal plan that is best for you. Your meal plan may vary depending on factors such as:  The calories you need.  The medicines you take.  Your weight.  Your blood glucose, blood pressure, and cholesterol levels.  Your activity level.  Other health conditions you have, such as heart or kidney disease. How do carbohydrates affect me? Carbohydrates, also called carbs, affect your blood glucose level more than any other type of food. Eating carbs naturally raises the amount of glucose in your blood. Carb counting is a method for keeping track of how many carbs you eat. Counting carbs is important to keep your blood glucose at a healthy level, especially if you use insulin or take certain oral diabetes medicines. It is important to know how many carbs you can safely have in each meal. This is different for every person. Your dietitian can help you calculate how many carbs you should have at each meal and for each snack. How does alcohol affect me? Alcohol can cause a sudden decrease in blood glucose (hypoglycemia), especially if you use insulin or take certain oral diabetes medicines. Hypoglycemia can be a life-threatening condition. Symptoms of hypoglycemia, such as sleepiness, dizziness, and confusion, are similar to symptoms of having too much  alcohol.  Do not drink alcohol if: ? Your health care provider tells you not to drink. ? You are pregnant, may be pregnant, or are planning to become pregnant.  If you drink alcohol: ? Do not drink on an empty stomach. ? Limit how much you use to:  0-1 drink a day for women.  0-2 drinks a day for men. ? Be aware of how much alcohol is in your drink. In the [place] one drink equals one 12 oz bottle of beer (355 mL), one 5 oz glass of wine (148 mL), or one 1 oz glass of hard liquor (44 mL). ? Keep yourself hydrated with water, diet soda, or unsweetened iced tea.  Keep in mind that regular soda, juice, and other mixers may contain a lot of sugar and must be counted as carbs. What are tips for following this plan? Reading food labels  Start by checking the serving size on the "Nutrition Facts" label of packaged foods and drinks. The amount of calories, carbs, fats, and other nutrients listed on the label is based on one serving of the item. Many items contain more than one serving per package.  Check the total grams (g) of carbs in one serving. You can calculate the number of servings of carbs in one serving by dividing the total carbs by 15. For example, if a food has 30 g of total carbs per serving, it would be equal to 2 servings of carbs.  Check the number of grams (g) of saturated fats and trans fats in one serving. Choose foods that have   a low amount or none of these fats.  Check the number of milligrams (mg) of salt (sodium) in one serving. Most people should limit total sodium intake to less than 2,300 mg per day.  Always check the nutrition information of foods labeled as "low-fat" or "nonfat." These foods may be higher in added sugar or refined carbs and should be avoided.  Talk to your dietitian to identify your daily goals for nutrients listed on the label. Shopping  Avoid buying canned, pre-made, or processed foods. These foods tend to be high in fat, sodium, and added  sugar.  Shop around the outside edge of the grocery store. This is where you will most often find fresh fruits and vegetables, bulk grains, fresh meats, and fresh dairy. Cooking  Use low-heat cooking methods, such as baking, instead of high-heat cooking methods like deep frying.  Cook using healthy oils, such as olive, canola, or sunflower oil.  Avoid cooking with butter, cream, or high-fat meats. Meal planning  Eat meals and snacks regularly, preferably at the same times every day. Avoid going long periods of time without eating.  Eat foods that are high in fiber, such as fresh fruits, vegetables, beans, and whole grains. Talk with your dietitian about how many servings of carbs you can eat at each meal.  Eat 4-6 oz (112-168 g) of lean protein each day, such as lean meat, chicken, fish, eggs, or tofu. One ounce (oz) of lean protein is equal to: ? 1 oz (28 g) of meat, chicken, or fish. ? 1 egg. ?  cup (62 g) of tofu.  Eat some foods each day that contain healthy fats, such as avocado, nuts, seeds, and fish.   What foods should I eat? Fruits Berries. Apples. Oranges. Peaches. Apricots. Plums. Grapes. Mango. Papaya. Pomegranate. Kiwi. Cherries. Vegetables Lettuce. Spinach. Leafy greens, including kale, chard, collard greens, and mustard greens. Beets. Cauliflower. Cabbage. Broccoli. Carrots. Green beans. Tomatoes. Peppers. Onions. Cucumbers. Brussels sprouts. Grains Whole grains, such as whole-wheat or whole-grain bread, crackers, tortillas, cereal, and pasta. Unsweetened oatmeal. Quinoa. Brown or wild rice. Meats and other proteins Seafood. Poultry without skin. Lean cuts of poultry and beef. Tofu. Nuts. Seeds. Dairy Low-fat or fat-free dairy products such as milk, yogurt, and cheese. The items listed above may not be a complete list of foods and beverages you can eat. Contact a dietitian for more information. What foods should I avoid? Fruits Fruits canned with  syrup. Vegetables Canned vegetables. Frozen vegetables with butter or cream sauce. Grains Refined white flour and flour products such as bread, pasta, snack foods, and cereals. Avoid all processed foods. Meats and other proteins Fatty cuts of meat. Poultry with skin. Breaded or fried meats. Processed meat. Avoid saturated fats. Dairy Full-fat yogurt, cheese, or milk. Beverages Sweetened drinks, such as soda or iced tea. The items listed above may not be a complete list of foods and beverages you should avoid. Contact a dietitian for more information. Questions to ask a health care provider  Do I need to meet with a diabetes educator?  Do I need to meet with a dietitian?  What number can I call if I have questions?  When are the best times to check my blood glucose? Where to find more information:  American Diabetes Association: diabetes.org  Academy of Nutrition and Dietetics: www.eatright.org  National Institute of Diabetes and Digestive and Kidney Diseases: www.niddk.nih.gov  Association of Diabetes Care and Education Specialists: www.diabeteseducator.org Summary  It is important to have healthy eating   habits because your blood sugar (glucose) levels are greatly affected by what you eat and drink.  A healthy meal plan will help you control your blood glucose and maintain a healthy lifestyle.  Your health care provider may recommend that you work with a dietitian to make a meal plan that is best for you.  Keep in mind that carbohydrates (carbs) and alcohol have immediate effects on your blood glucose levels. It is important to count carbs and to use alcohol carefully. This information is not intended to replace advice given to you by your health care provider. Make sure you discuss any questions you have with your health care provider. Document Revised: 01/04/2019 Document Reviewed: 01/04/2019 Elsevier Patient Education  2021 Elsevier Inc.  

## 2020-11-09 ENCOUNTER — Telehealth: Payer: Self-pay | Admitting: *Deleted

## 2020-11-09 LAB — BMP8+EGFR
BUN/Creatinine Ratio: 13 (ref 12–28)
BUN: 11 mg/dL (ref 8–27)
CO2: 23 mmol/L (ref 20–29)
Calcium: 8.9 mg/dL (ref 8.7–10.3)
Chloride: 105 mmol/L (ref 96–106)
Creatinine, Ser: 0.86 mg/dL (ref 0.57–1.00)
Glucose: 97 mg/dL (ref 70–99)
Potassium: 4.5 mmol/L (ref 3.5–5.2)
Sodium: 145 mmol/L — ABNORMAL HIGH (ref 134–144)
eGFR: 71 mL/min/{1.73_m2} (ref >59)

## 2020-11-09 LAB — HEMOGLOBIN A1C
Est. average glucose Bld gHb Est-mCnc: 134 mg/dL
Hgb A1c MFr Bld: 6.3 % — ABNORMAL HIGH (ref 4.8–5.6)

## 2020-11-09 LAB — VITAMIN D 25 HYDROXY (VIT D DEFICIENCY, FRACTURES): Vit D, 25-Hydroxy: 33.9 ng/mL (ref 30.0–100.0)

## 2020-11-09 NOTE — Chronic Care Management (AMB) (Signed)
  Chronic Care Management   Note  11/09/2020 Name: Sheila Reynolds MRN: [id number] DOB: [birth year]  Sheila Reynolds is a 75 y.o. year old female who is a primary care patient of Glendale Chard, MD. Sheila Reynolds is currently enrolled in care management services. An additional referral for Pharmacy was placed.   Follow up plan: Telephone appointment with care management team member scheduled for:12/12/20  [place] [facility]  Direct Dial: [phone]

## 2020-11-12 ENCOUNTER — Ambulatory Visit (INDEPENDENT_AMBULATORY_CARE_PROVIDER_SITE_OTHER): Payer: Medicare Other

## 2020-11-12 ENCOUNTER — Telehealth: Payer: Medicare Other

## 2020-11-12 DIAGNOSIS — I739 Peripheral vascular disease, unspecified: Secondary | ICD-10-CM

## 2020-11-12 DIAGNOSIS — E559 Vitamin D deficiency, unspecified: Secondary | ICD-10-CM

## 2020-11-12 DIAGNOSIS — E1165 Type 2 diabetes mellitus with hyperglycemia: Secondary | ICD-10-CM

## 2020-11-12 DIAGNOSIS — I5032 Chronic diastolic (congestive) heart failure: Secondary | ICD-10-CM

## 2020-11-12 DIAGNOSIS — E7800 Pure hypercholesterolemia, unspecified: Secondary | ICD-10-CM

## 2020-11-12 DIAGNOSIS — I890 Lymphedema, not elsewhere classified: Secondary | ICD-10-CM

## 2020-11-12 DIAGNOSIS — I110 Hypertensive heart disease with heart failure: Secondary | ICD-10-CM

## 2020-11-12 DIAGNOSIS — D869 Sarcoidosis, unspecified: Secondary | ICD-10-CM

## 2020-11-13 ENCOUNTER — Encounter (HOSPITAL_COMMUNITY)
Admission: RE | Admit: 2020-11-13 | Discharge: 2020-11-13 | Disposition: A | Discharge status: Home / Self Care | Payer: Medicare Other | Source: Ambulatory Visit | Attending: Internal Medicine | Admitting: Internal Medicine

## 2020-11-13 ENCOUNTER — Other Ambulatory Visit: Payer: Self-pay

## 2020-11-13 VITALS — Wt 179.5 lb

## 2020-11-13 DIAGNOSIS — D860 Sarcoidosis of lung: Secondary | ICD-10-CM | POA: Diagnosis not present

## 2020-11-13 NOTE — Progress Notes (Signed)
Daily Session Note  Patient Details  Name: Sheila Reynolds MRN: [id number] Date of Birth: 02-19-1945 Referring Provider:   April Manson Pulmonary Rehab Walk Test from 10/08/2020 in [place]  Referring Provider Dr. Annamaria Boots       Encounter Date: 11/13/2020  Check In:  Session Check In - 11/13/20 1116       Check-In   Supervising physician immediately available to respond to emergencies [facility] immediately available    Physician(s) Dr. Gerlean Ren    Location [facility]    Staff Present Rosebud Poles, RN, Milus Glazier, MS, ACSM-CEP, CCRP, Exercise Physiologist;Kaylee Rosana Hoes, MS, ACSM-CEP, Exercise Physiologist;Lisa Ysidro Evert, RN    Virtual Visit No    Medication changes reported     No    Fall or balance concerns reported    No    Tobacco Cessation No Change    Warm-up and Cool-down Performed as group-led instruction    Resistance Training Performed Yes    VAD Patient? No    PAD/SET Patient? No      Pain Assessment   Currently in Pain? No/denies    Multiple Pain Sites No             Capillary Blood Glucose: No results found for this or any previous visit (from the past 24 hour(s)).   Exercise Prescription Changes - 11/13/20 1200       Response to Exercise   Blood Pressure (Admit) 133/84    Blood Pressure (Exercise) 144/80    Blood Pressure (Exit) 118/64    Heart Rate (Admit) 79 bpm    Heart Rate (Exercise) 96 bpm    Heart Rate (Exit) 73 bpm    Oxygen Saturation (Admit) 99 %    Oxygen Saturation (Exercise) 99 %    Oxygen Saturation (Exit) 98 %    Rating of Perceived Exertion (Exercise) 12    Perceived Dyspnea (Exercise) 1.5    Duration Continue with 30 min of aerobic exercise without signs/symptoms of physical distress.    Intensity THRR unchanged      Progression   Progression Continue to progress workloads to maintain intensity without signs/symptoms of physical distress.      Resistance  Training   Training Prescription Yes    Weight Red Bands    Reps 10-15    Time 10 Minutes      NuStep   Level 4    SPM 80    Minutes 15    METs 1.7      Arm Ergometer   Level 3    Watts 34    Minutes 15             Social History   Tobacco Use  Smoking Status Former   Packs/day: 1.00   Years: 4.00   Pack years: 4.00   Types: Cigarettes   Quit date: 02/11/1980   Years since quitting: 40.7  Smokeless Tobacco Never    Goals Met:  Proper associated with RPD/PD & O2 Sat Exercise tolerated well No report of concerns or symptoms today Strength training completed today  Goals Unmet:  Not Applicable  Comments: Service time is from 1019 to 1131.    Dr. Fransico Him is Medical Director for Cardiac Rehab at [facility].

## 2020-11-14 ENCOUNTER — Ambulatory Visit (INDEPENDENT_AMBULATORY_CARE_PROVIDER_SITE_OTHER): Payer: Medicare Other | Admitting: Nurse Practitioner

## 2020-11-14 VITALS — BP 118/60 | HR 87 | Temp 97.9°F | Ht 62.0 in | Wt 174.4 lb

## 2020-11-14 DIAGNOSIS — I890 Lymphedema, not elsewhere classified: Secondary | ICD-10-CM | POA: Diagnosis not present

## 2020-11-14 DIAGNOSIS — M79605 Pain in left leg: Secondary | ICD-10-CM

## 2020-11-14 NOTE — Progress Notes (Signed)
This visit occurred during the SARS-CoV-2 public health emergency.  Safety protocols were in place, including screening questions prior to the visit, additional usage of staff PPE, and extensive cleaning of exam room while observing appropriate contact time as indicated for disinfecting solutions.  Subjective:     Patient ID: Sheila Reynolds , female    DOB: July 25, 1945 , 75 y.o.   MRN: [id number]   Chief Complaint  Patient presents with   Leg Pain    HPI  She has some swelling in her right leg and pain in both legs. She has SOB but that has been going on for a long time with the pulmonary function. She also has lymphedema that she gets her legs wrapped. She wears the compression hose and a machine that helps her with draining of the  fluid. She has a black spot on her left lower leg.   Leg Pain     Past Medical History:  Diagnosis Date   Anemia    3 months ago anemic   Anxiety    on meds   Arthritis    "all over" (01/15/2017)   Asthma    Bronchitis with emphysema    Chest pain    Chronic bronchitis ([place])    Coronary artery disease    a. 01/2017 she underwent orbital atherectomy/DES to the proxmal LAD and PTCA to ostial D2. 2D Echo 01/15/17 showed mild LVH, EF 60-65%, grade 1 DD.   Family history of anesthesia complication    daughter N/V   Fibromyalgia    GERD (gastroesophageal reflux disease)    on meds   Glaucoma    Heart murmur    History of hiatal hernia    Hx of echocardiogram    Echo (03/2013):  Tech limited; Mild focal basal septal hypertrophy, EF 60-65%, normal RVF   Hyperlipidemia    Hypertension    Nonsustained ventricular tachycardia (HCC)    OSA on CPAP    Premature atrial contractions    PVC (premature ventricular contraction)    a. Holter 12/16: NSR, occ PAC,PVCs   Sarcoidosis    Type II diabetes mellitus ([place])      Family History  Problem Relation Age of Onset   Heart disease Father    Diabetes Father    Glaucoma Father    Cancer Mother         unknown type, ?lung   Heart disease Paternal Grandmother    Cancer Paternal Grandmother        unknown type   Diabetes Brother    Glaucoma Brother      Current Outpatient Medications:    acetaminophen (TYLENOL) 500 MG tablet, Take 1,000 mg by mouth 2 (two) times daily as needed for moderate pain or headache., Disp: , Rfl:    albuterol (PROVENTIL) (2.5 MG/3ML) 0.083% nebulizer solution, Take 3 mLs (2.5 mg total) by nebulization daily as needed for wheezing or shortness of breath., Disp: 75 mL, Rfl: 12   albuterol (VENTOLIN HFA) 108 (90 Base) MCG/ACT inhaler, Inhale 2 puffs into the lungs every 6 (six) hours as needed for wheezing or shortness of breath., Disp: 18 g, Rfl: 12   aspirin EC 81 MG tablet, Take 81 mg by mouth daily. Swallow whole., Disp: , Rfl:    atorvastatin (LIPITOR) 80 MG tablet, TAKE 1 TABLET(80 MG) BY MOUTH DAILY, Disp: 90 tablet, Rfl: 1   Blood Glucose Monitoring Suppl (ACCU-CHEK AVIVA PLUS) w/Device KIT, Use to check blood sugars 3 times a day. Dx code  e11.65, Disp: 1 kit, Rfl: 3   brimonidine (ALPHAGAN P) 0.1 % SOLN, Place 1 drop into both eyes 2 (two) times daily. , Disp: , Rfl:    brinzolamide (AZOPT) 1 % ophthalmic suspension, Place 1 drop into both eyes 3 (three) times daily., Disp: , Rfl:    carvedilol (COREG) 12.5 MG tablet, TAKE 1 TABLET(12.5 MG) BY MOUTH TWICE DAILY, Disp: 180 tablet, Rfl: 3   clopidogrel (PLAVIX) 75 MG tablet, Take 1 tablet (75 mg total) by mouth daily., Disp: 90 tablet, Rfl: 3   EPINEPHrine 0.3 mg/0.3 mL IJ SOAJ injection, Inject 0.3 mg into the muscle as needed for anaphylaxis., Disp: 1 each, Rfl: 0   ezetimibe (ZETIA) 10 MG tablet, Take 1 tablet (10 mg total) by mouth daily., Disp: 90 tablet, Rfl: 3   furosemide (LASIX) 40 MG tablet, Take 1 tablet (40 mg total) by mouth every other day., Disp: 90 tablet, Rfl: 1   isosorbide mononitrate (IMDUR) 60 MG 24 hr tablet, TAKE 1 TABLET(60 MG) BY MOUTH DAILY, Disp: 90 tablet, Rfl: 3   ketorolac (ACULAR)  0.5 % ophthalmic solution, Place 1 drop into the right eye 3 (three) times daily., Disp: , Rfl:    Lancets (ONETOUCH DELICA PLUS [id number]) MISC, CHECK BLOOD SUGAR BEFORE BREAKFAST AND DINNER, Disp: 100 each, Rfl: 1   montelukast (SINGULAIR) 10 MG tablet, TAKE 1 TABLET(10 MG) BY MOUTH EVERY EVENING, Disp: 90 tablet, Rfl: 1   Nebulizers (COMPRESSOR/NEBULIZER) MISC, Use as directed, Disp: 1 each, Rfl: 0   nitroGLYCERIN (NITROSTAT) 0.4 MG SL tablet, Place 1 tablet (0.4 mg total) under the tongue every 5 (five) minutes as needed for chest pain. (Patient not taking: Reported on 11/08/2020), Disp: 25 tablet, Rfl: prn   pantoprazole (PROTONIX) 40 MG tablet, Take 40 mg by mouth daily., Disp: , Rfl:    Polyvinyl Alcohol-Povidone PF (REFRESH) 1.4-0.6 % SOLN, Place 1-2 drops into both eyes 3 (three) times daily as needed (dry/irritated eyes.)., Disp: , Rfl:    potassium chloride SA (KLOR-CON) 20 MEQ tablet, Take 1 tablet (20 mEq total) by mouth every other day., Disp: 90 tablet, Rfl: 1   prednisoLONE acetate (PRED FORTE) 1 % ophthalmic suspension, Place 1 drop into both eyes 4 (four) times daily. , Disp: , Rfl:    pregabalin (LYRICA) 75 MG capsule, TAKE 1 CAPSULE(75 MG) BY MOUTH TWICE DAILY, Disp: 180 capsule, Rfl: 1   sitaGLIPtin-metformin (JANUMET) 50-500 MG tablet, Take 1 tablet by mouth 2 (two) times daily with a meal., Disp: 180 tablet, Rfl: 1   topiramate (TOPAMAX) 25 MG tablet, Take 1 tablet (25 mg total) by mouth 2 (two) times daily., Disp: 60 tablet, Rfl: 1   Travoprost, BAK Free, (TRAVATAN) 0.004 % SOLN ophthalmic solution, Place 1 drop into the right eye at bedtime. , Disp: , Rfl:    umeclidinium-vilanterol (ANORO ELLIPTA) 62.5-25 MCG/INH AEPB, Inhale 1 puff into the lungs daily., Disp: 14 each, Rfl: 0   Allergies  Allergen Reactions   Crestor [Rosuvastatin Calcium] Other (See Comments)    muscle aches   Demerol  [Meperidine Hcl]     Other reaction(s): Hallucinations   Shellfish Allergy Itching  and Other (See Comments)    Crab, shrimp and lobster ---lips itch and tingle Was told not to eat again after having a allergy test. Lobster, crab and shrimp      Review of Systems  Constitutional:  Negative for chills, fatigue and fever.  Respiratory:  Negative for cough, shortness of breath and wheezing.  Cardiovascular:  Negative for chest pain and palpitations.  Gastrointestinal:  Negative for constipation and diarrhea.  Endocrine: Negative for polydipsia, polyphagia and polyuria.  Musculoskeletal:  Negative for arthralgias and myalgias.  Neurological:  Negative for weakness and headaches.    Today's Vitals   11/14/20 1531  BP: 118/60  Pulse: 87  Temp: 97.9 F (36.6 C)  TempSrc: Oral  Weight: 174 lb 6.4 oz (79.1 kg)  Height: 5' 2"  (1.575 m)   Body mass index is 31.9 kg/m.   Objective:  Physical Exam Constitutional:      Appearance: Normal appearance. She is obese.  HENT:     Head: Normocephalic and atraumatic.  Cardiovascular:     Rate and Rhythm: Normal rate and regular rhythm.     Pulses: Normal pulses.     Heart sounds: Normal heart sounds. No murmur heard. Pulmonary:     Effort: Pulmonary effort is normal. No respiratory distress.     Breath sounds: Normal breath sounds. No wheezing.  Musculoskeletal:        General: Swelling and tenderness present.     Right lower leg: Edema present.     Left lower leg: Edema present.     Comments: Swelling of both lower leg extremity.   Skin:    General: Skin is warm and dry.     Capillary Refill: Capillary refill takes less than 2 seconds.  Neurological:     Mental Status: She is alert and oriented to person, place, and time.  Psychiatric:        Mood and Affect: Mood normal.        Behavior: Behavior normal.        Assessment And Plan:     1. Pain of left lower extremity - D-dimer, quantitative - CBC no Diff - CMP14+EGFR - VAS [place] LOWER EXTREMITY VENOUS (DVT); Future  -Denies SOB, chest pain  -Reviewed  history with the patient.  -She does have a history of lymphedema and does treatment at home.  -Will check for DVT due to symptoms and increased swelling and pain reported by patient.   2. Lymphedema of both lower extremities -She does have a history of lymphedema in both legs for which she gets treatment for and wears compression socks.   Follow up: if symptoms persist or do not get better.   The patient was encouraged to call or send a message through [facility] for any questions or concerns.   Side effects and appropriate use of all the medication(s) were discussed with the patient today. Patient advised to use the medication(s) as directed by their healthcare provider. The patient was encouraged to read, review, and understand all associated package inserts and contact our office with any questions or concerns. The patient accepts the risks of the treatment plan and had an opportunity to ask questions.   Patient was given opportunity to ask questions. Patient verbalized understanding of the plan and was able to repeat key elements of the plan. All questions were answered to their satisfaction.  Raman Crew Goren, DNP   I, Raman Marget Outten have reviewed all documentation for this visit. The documentation on 11/14/20 for the exam, diagnosis, procedures, and orders are all accurate and complete.    THE PATIENT IS ENCOURAGED TO PRACTICE SOCIAL DISTANCING DUE TO THE COVID-19 PANDEMIC.

## 2020-11-15 ENCOUNTER — Encounter: Payer: Self-pay | Admitting: Neurology

## 2020-11-15 ENCOUNTER — Encounter (HOSPITAL_COMMUNITY)
Admission: RE | Admit: 2020-11-15 | Discharge: 2020-11-15 | Disposition: A | Discharge status: Home / Self Care | Payer: Medicare Other | Source: Ambulatory Visit | Attending: Internal Medicine | Admitting: Internal Medicine

## 2020-11-15 ENCOUNTER — Other Ambulatory Visit: Payer: Self-pay

## 2020-11-15 ENCOUNTER — Ambulatory Visit (INDEPENDENT_AMBULATORY_CARE_PROVIDER_SITE_OTHER): Payer: Medicare Other | Admitting: Neurology

## 2020-11-15 VITALS — BP 151/65 | HR 77 | Ht 62.0 in | Wt 174.0 lb

## 2020-11-15 DIAGNOSIS — G44209 Tension-type headache, unspecified, not intractable: Secondary | ICD-10-CM

## 2020-11-15 DIAGNOSIS — I25119 Atherosclerotic heart disease of native coronary artery with unspecified angina pectoris: Secondary | ICD-10-CM

## 2020-11-15 DIAGNOSIS — D860 Sarcoidosis of lung: Secondary | ICD-10-CM | POA: Diagnosis not present

## 2020-11-15 LAB — CBC
Hematocrit: 35.9 % (ref 34.0–46.6)
Hemoglobin: 11.6 g/dL (ref 11.1–15.9)
MCH: 27.6 pg (ref 26.6–33.0)
MCHC: 32.3 g/dL (ref 31.5–35.7)
MCV: 86 fL (ref 79–97)
Platelets: 206 10*3/uL (ref 150–450)
RBC: 4.2 x10E6/uL (ref 3.77–5.28)
RDW: 13.8 % (ref 11.7–15.4)
WBC: 6.7 10*3/uL (ref 3.4–10.8)

## 2020-11-15 LAB — CMP14+EGFR
ALT: 15 IU/L (ref 0–32)
AST: 17 IU/L (ref 0–40)
Albumin/Globulin Ratio: 2.3 — ABNORMAL HIGH (ref 1.2–2.2)
Albumin: 4.6 g/dL (ref 3.7–4.7)
Alkaline Phosphatase: 115 IU/L (ref 44–121)
BUN/Creatinine Ratio: 13 (ref 12–28)
BUN: 11 mg/dL (ref 8–27)
Bilirubin Total: 0.4 mg/dL (ref 0.0–1.2)
CO2: 26 mmol/L (ref 20–29)
Calcium: 9.8 mg/dL (ref 8.7–10.3)
Chloride: 103 mmol/L (ref 96–106)
Creatinine, Ser: 0.88 mg/dL (ref 0.57–1.00)
Globulin, Total: 2 g/dL (ref 1.5–4.5)
Glucose: 148 mg/dL — ABNORMAL HIGH (ref 70–99)
Potassium: 4.4 mmol/L (ref 3.5–5.2)
Sodium: 142 mmol/L (ref 134–144)
Total Protein: 6.6 g/dL (ref 6.0–8.5)
eGFR: 69 mL/min/{1.73_m2} (ref >59)

## 2020-11-15 LAB — D-DIMER, QUANTITATIVE: D-DIMER: 0.96 mg/L FEU — ABNORMAL HIGH (ref 0.00–0.49)

## 2020-11-15 MED ORDER — TOPIRAMATE 25 MG PO TABS: 25.0000 mg | ORAL_TABLET | Freq: Every evening | ORAL | 3 refills | Status: DC

## 2020-11-15 NOTE — Progress Notes (Signed)
Daily Session Note  Patient Details  Name: Sheila Reynolds MRN: [id number] Date of Birth: 03/29/1945 Referring Provider:   April Manson Pulmonary Rehab Walk Test from 10/08/2020 in [place]  Referring Provider Dr. Annamaria Boots       Encounter Date: 11/15/2020  Check In:  Session Check In - 11/15/20 1108       Check-In   Supervising physician immediately available to respond to emergencies [facility] immediately available    Physician(s) Dr. Nevada Crane    Location [facility]    Staff Present Rosebud Poles, RN, Quentin Ore, MS, ACSM-CEP, Exercise Physiologist;Lisa Ysidro Evert, RN    Virtual Visit No    Medication changes reported     No    Fall or balance concerns reported    No    Tobacco Cessation No Change    Warm-up and Cool-down Performed as group-led instruction    Resistance Training Performed Yes    VAD Patient? No    PAD/SET Patient? No      Pain Assessment   Currently in Pain? No/denies    Multiple Pain Sites No             Capillary Blood Glucose: Results for orders placed or performed in visit on 11/14/20 (from the past 24 hour(s))  D-dimer, quantitative     Status: Abnormal   Collection Time: 11/14/20  3:49 PM  Result Value Ref Range   D-DIMER 0.96 (H) 0.00 - 0.49 mg/L FEU   Narrative   Performed at:  [address] [place], [place]  [id number] Lab Director: Rush Farmer MD, Phone:  [phone]  CBC no Diff     Status: None   Collection Time: 11/14/20  3:49 PM  Result Value Ref Range   WBC 6.7 3.4 - 10.8 x10E3/uL   RBC 4.20 3.77 - 5.28 x10E6/uL   Hemoglobin 11.6 11.1 - 15.9 g/dL   Hematocrit 35.9 34.0 - 46.6 %   MCV 86 79 - 97 fL   MCH 27.6 26.6 - 33.0 pg   MCHC 32.3 31.5 - 35.7 g/dL   RDW 13.8 11.7 - 15.4 %   Platelets 206 150 - 450 x10E3/uL   Narrative   Performed at:  [facility] [address], [place], [place]  [id number] Lab Director: Rush Farmer MD,  Phone:  [phone]  CMP14+EGFR     Status: Abnormal   Collection Time: 11/14/20  3:49 PM  Result Value Ref Range   Glucose 148 (H) 70 - 99 mg/dL   BUN 11 8 - 27 mg/dL   Creatinine, Ser 0.88 0.57 - 1.00 mg/dL   eGFR 69 >59 mL/min/1.73   BUN/Creatinine Ratio 13 12 - 28   Sodium 142 134 - 144 mmol/L   Potassium 4.4 3.5 - 5.2 mmol/L   Chloride 103 96 - 106 mmol/L   CO2 26 20 - 29 mmol/L   Calcium 9.8 8.7 - 10.3 mg/dL   Total Protein 6.6 6.0 - 8.5 g/dL   Albumin 4.6 3.7 - 4.7 g/dL   Globulin, Total 2.0 1.5 - 4.5 g/dL   Albumin/Globulin Ratio 2.3 (H) 1.2 - 2.2   Bilirubin Total 0.4 0.0 - 1.2 mg/dL   Alkaline Phosphatase 115 44 - 121 IU/L   AST 17 0 - 40 IU/L   ALT 15 0 - 32 IU/L   Narrative   Performed at:  [facility] [address], [place], [place]  [id number] Lab Director: Derinda Late  Perlie Gold MD, Phone:  [phone]      Social History   Tobacco Use  Smoking Status Former   Packs/day: 1.00   Years: 4.00   Pack years: 4.00   Types: Cigarettes   Quit date: 02/11/1980   Years since quitting: 40.7  Smokeless Tobacco Never    Goals Met:  Proper associated with RPD/PD & O2 Sat Improved SOB with ADL's Exercise tolerated well No report of concerns or symptoms today Strength training completed today  Goals Unmet:  Not Applicable  Comments: Service time is from 1021 to 1137.    Dr. Fransico Him is Medical Director for Cardiac Rehab at [facility]

## 2020-11-15 NOTE — Patient Instructions (Signed)
I had a long discussion with patient with regards to her headaches which appear improved but are still bothersome at night hence I recommend she take Topamax 25 mg at night every day.  She could use extra Tylenol if needed in addition.  We also discussed doing regular neck stretching exercises.  She will return for follow-up in the future in 3 months with my nurse practitioner Janett Billow or call earlier if necessary. Neck Exercises Ask your health care provider which exercises are safe for you. Do exercises exactly as told by your health care provider and adjust them as directed. It is normal to feel mild stretching, pulling, tightness, or discomfort as you do these exercises. Stop right away if you feel sudden pain or your pain gets worse. Do not begin these exercises until told by your health care provider. Neck exercises can be important for many reasons. They can improve strength and maintain flexibility in your neck, which will help your upper back and prevent neck pain. Stretching exercises Rotation neck stretching  Sit in a chair or stand up. Place your feet flat on the floor, shoulder width apart. Slowly turn your head (rotate) to the right until a slight stretch is felt. Turn it all the way to the right so you can look over your right shoulder. Do not tilt or tip your head. Hold this position for 10-30 seconds. Slowly turn your head (rotate) to the left until a slight stretch is felt. Turn it all the way to the left so you can look over your left shoulder. Do not tilt or tip your head. Hold this position for 10-30 seconds. Repeat __________ times. Complete this exercise __________ times a day. Neck retraction Sit in a sturdy chair or stand up. Look straight ahead. Do not bend your neck. Use your fingers to push your chin backward (retraction). Do not bend your neck for this movement. Continue to face straight ahead. If you are doing the exercise properly, you will feel a slight sensation in  your throat and a stretch at the back of your neck. Hold the stretch for 1-2 seconds. Repeat __________ times. Complete this exercise __________ times a day. Strengthening exercises Neck press Lie on your back on a firm bed or on the floor with a pillow under your head. Use your neck muscles to push your head down on the pillow and straighten your spine. Hold the position as well as you can. Keep your head facing up (in a neutral position) and your chin tucked. Slowly count to 5 while holding this position. Repeat __________ times. Complete this exercise __________ times a day. Isometrics These are exercises in which you strengthen the muscles in your neck while keeping your neck still (isometrics). Sit in a supportive chair and place your hand on your forehead. Keep your head and face facing straight ahead. Do not flex or extend your neck while doing isometrics. Push forward with your head and neck while pushing back with your hand. Hold for 10 seconds. Do the sequence again, this time putting your hand against the back of your head. Use your head and neck to push backward against the hand pressure. Finally, do the same exercise on either side of your head, pushing sideways against the pressure of your hand. Repeat __________ times. Complete this exercise __________ times a day. Prone head lifts Lie face-down (prone position), resting on your elbows so that your chest and upper back are raised. Start with your head facing downward, near your chest.  Position your chin either on or near your chest. Slowly lift your head upward. Lift until you are looking straight ahead. Then continue lifting your head as far back as you can comfortably stretch. Hold your head up for 5 seconds. Then slowly lower it to your starting position. Repeat __________ times. Complete this exercise __________ times a day. Supine head lifts Lie on your back (supine position), bending your knees to point to the ceiling  and keeping your feet flat on the floor. Lift your head slowly off the floor, raising your chin toward your chest. Hold for 5 seconds. Repeat __________ times. Complete this exercise __________ times a day. Scapular retraction Stand with your arms at your sides. Look straight ahead. Slowly pull both shoulders (scapulae) backward and downward (retraction) until you feel a stretch between your shoulder blades in your upper back. Hold for 10-30 seconds. Relax and repeat. Repeat __________ times. Complete this exercise __________ times a day. Contact a health care provider if: Your neck pain or discomfort gets much worse when you do an exercise. Your neck pain or discomfort does not improve within 2 hours after you exercise. If you have any of these problems, stop exercising right away. Do not do the exercises again unless your health care provider says that you can. Get help right away if: You develop sudden, severe neck pain. If this happens, stop exercising right away. Do not do the exercises again unless your health care provider says that you can. This information is not intended to replace advice given to you by your health care provider. Make sure you discuss any questions you have with your health care provider. Document Revised: 11/25/2017 Document Reviewed: 11/25/2017 Elsevier Patient Education  2022 [employer].

## 2020-11-15 NOTE — Progress Notes (Signed)
[facility] [address] [place]. [place] [address] [phone]       OFFICE FOLLOW UP NOTE  [name] Date of Birth:  August 13, 1945 Medical Record Number:  [id number]   Referring MD:  Glendale Chard  Reason for Referral:  stroke  Chief Complaint  Patient presents with   Follow-up    Room 13, alone States she stopped topamax, she did not like how it made her feel     HPI:   Today, 09/03/2020, Sheila Reynolds returns for sooner scheduled visit for headaches. She reports being seen by her ophthalmologist who advised her to follow-up with our office for further evaluation.  Extensive work-up completed after prior visit in 04/2020 including MR brain, MR cervical, EEG and lab work which was all unremarkable.  Reports over the past 3 months, she has been experiencing left orbital and periorbital headaches.  Initially experiencing them daily but have since been gradually improving.  She may experience dull headache daily but at times can be severe throbbing headache accompanied by blurred vision but denies actual loss of vision.  She was evaluated by her ophthalmologist with history of open-angle glaucoma bilaterally with recent exam normal IOP bilaterally.  She also has chronic iritis bilaterally with use of prednisone drops which she reports will improve orbital headache.  She also intermittently use Tylenol with benefit.  Denies photophobia, phonophobia or N/V.  She was seen by her PCP who did lab work on 6/27 with a normal CRP and sed rate.    History provided for reference purposes only Update 04/03/2020 Dr. Leonie Man: She returns for follow-up after last visit year and half ago.  She has new complaints of worsening balance, dizziness, memory as well as headaches and neck pain.  She states her balance is poor and she has had a few falls but no injuries.  She has to be careful and often can catch her self and does not fall.  She denies any significant neck pain radiating  down her spine but does complain of tightness in the neck and trouble moving her neck.  She has had no recent neck x-rays or scans done.  She also complains of posterior headaches as well as bifrontal headaches which are intermittent and last only few minutes.  She takes some Tylenol which seems to help.  Cold compression also seems to help her headaches.  She did undergo some carotid ultrasound at last visit after she saw me on 10/29/2018 which showed no significant extracranial stenosis and transcranial Doppler study was also done which showed poor left improvement of but normal velocities in the vessels that could be studied.  On inquiry she admits to numbness in the right hand which is intermittent but more in the night.  She does agree that the numbness seems to more prominent when she performs activities with rapid repetitive wrist flexion.  Initial visit 10/20/2018 Dr. Leonie Man: Sheila Reynolds is a 75 year old African-American lady seen today for initial office consultation visit for stroke.  She is accompanied by her husband today.  History is obtained from them and review of referral notes and have personally reviewed imaging films in PACS.  The patient had been having steady decline in the vision acuity in the right eye and for which she was seen by neuro-ophthalmology recently.  They ordered an MRI of the brain and orbits which was done on 10/05/2018 which I personally reviewed MRI of the orbits was unremarkable but MRI scan of the brain showed a small  left thalamic acute infarct.  Patient had no neurological symptoms at that time and this is likely an incidental finding.  Patient denied any symptoms of right-sided numbness, tingling, weakness gait or balance problems or memory problems.  She thought a few days prior to her MRI she had some twitchings in the left side of the face but this lasted only a few hours and recovered.  She denied any increased watering of the eyes lack of sensation or weakness.  Patient  has no prior history of strokes, TIAs, seizures, migraines or significant neurological problems.  She had lab work on 10/07/2018 which showed LDL cholesterol of 71 mg percent.  She takes Lipitor 40 mg daily.  She had a hemoglobin A1c of 6.1 on 07/20/2018.  Her blood pressure is usually well controlled and today it is 134/77.  Her fasting sugars have all been good in the 110 range.  She also has sleep apnea and is quite compliant with her CPAP and takes it every night.  She has not had any vascular imaging studies done for the brain of the neck.  She has no neurological complaints today. Update 11/15/2020 : She returns for follow-up after last visit with [place] practitioner on 09/04/2020.  She states headaches are slightly better but still occur 2-3 times per week particularly at night she often has to wake up.  She has to take 2 tablets of Tylenol which seems to help and she is able to go back to sleep.  She also has to put prednisone drops into her eyes which also helped.  She did try Topamax and did all right for a while she was taking 1 tablet a day and it seemed to help but when she increase it to 2 tablets a day it made her quite sleepy and hence she stopped it after 2 weeks.  She does complain of some posterior neck muscle tightness and pain which she has not been doing any neck stretching exercises.  She continues to have mild gait and balance difficulties but these are longstanding and unchanged.  She has no new complaints. ROS:   14 system review of systems is positive for those listed in HPI and all other systems negative e and all other systems negative  PMH:  Past Medical History:  Diagnosis Date   Anemia    3 months ago anemic   Anxiety    on meds   Arthritis    "all over" (01/15/2017)   Asthma    Bronchitis with emphysema    Chest pain    Chronic bronchitis ([place])    Coronary artery disease    a. 01/2017 she underwent orbital atherectomy/DES to the proxmal LAD and PTCA to ostial D2.  2D Echo 01/15/17 showed mild LVH, EF 60-65%, grade 1 DD.   Family history of anesthesia complication    daughter N/V   Fibromyalgia    GERD (gastroesophageal reflux disease)    on meds   Glaucoma    Heart murmur    History of hiatal hernia    Hx of echocardiogram    Echo (03/2013):  Tech limited; Mild focal basal septal hypertrophy, EF 60-65%, normal RVF   Hyperlipidemia    Hypertension    Nonsustained ventricular tachycardia    OSA on CPAP    Premature atrial contractions    PVC (premature ventricular contraction)    a. Holter 12/16: NSR, occ PAC,PVCs   Sarcoidosis    Type II diabetes mellitus ([place])  Social History:  Social History   Socioeconomic History   Marital status: Married    Spouse name: Gerald Stabs   Number of children: 2   Years of education: Not on file   Highest education level: Not on file  Occupational History   Occupation: unemployed    Employer: [employer]  Tobacco Use   Smoking status: Former    Packs/day: 1.00    Years: 4.00    Pack years: 4.00    Types: Cigarettes    Quit date: 02/11/1980    Years since quitting: 40.7   Smokeless tobacco: Never  Vaping Use   Vaping Use: Never used  Substance and Sexual Activity   Alcohol use: No   Drug use: No   Sexual activity: Yes  Other Topics Concern   Not on file  Social History Narrative   Lives with husband   Right hand   Drinks 2-3 cups caffeine daily   Social Determinants of Health   Financial Resource Strain: Medium Risk   Difficulty of Paying Living Expenses: Somewhat hard  Food Insecurity: No Food Insecurity   Worried About Charity fundraiser in the Last Year: Never true   Ran Out of Food in the Last Year: Never true  Transportation Needs: No Transportation Needs   Lack of Transportation (Medical): No   Lack of Transportation (Non-Medical): No  Physical Activity: Inactive   Days of Exercise per Week: 0 days   Minutes of Exercise per Session: 0 min  Stress: Stress Concern  Present   Feeling of Stress : To some extent  Social Connections: Not on file  Intimate Partner Violence: Not on file    Medications:   Current Outpatient Medications on File Prior to Visit  Medication Sig Dispense Refill   acetaminophen (TYLENOL) 500 MG tablet Take 1,000 mg by mouth 2 (two) times daily as needed for moderate pain or headache.     albuterol (PROVENTIL) (2.5 MG/3ML) 0.083% nebulizer solution Take 3 mLs (2.5 mg total) by nebulization daily as needed for wheezing or shortness of breath. 75 mL 12   albuterol (VENTOLIN HFA) 108 (90 Base) MCG/ACT inhaler Inhale 2 puffs into the lungs every 6 (six) hours as needed for wheezing or shortness of breath. 18 g 12   aspirin EC 81 MG tablet Take 81 mg by mouth daily. Swallow whole.     Blood Glucose Monitoring Suppl (ACCU-CHEK AVIVA PLUS) w/Device KIT Use to check blood sugars 3 times a day. Dx code e11.65 1 kit 3   brimonidine (ALPHAGAN P) 0.1 % SOLN Place 1 drop into both eyes 2 (two) times daily.      brinzolamide (AZOPT) 1 % ophthalmic suspension Place 1 drop into both eyes 3 (three) times daily.     carvedilol (COREG) 12.5 MG tablet TAKE 1 TABLET(12.5 MG) BY MOUTH TWICE DAILY 180 tablet 3   clopidogrel (PLAVIX) 75 MG tablet Take 1 tablet (75 mg total) by mouth daily. 90 tablet 3   EPINEPHrine 0.3 mg/0.3 mL IJ SOAJ injection Inject 0.3 mg into the muscle as needed for anaphylaxis. 1 each 0   ezetimibe (ZETIA) 10 MG tablet Take 1 tablet (10 mg total) by mouth daily. 90 tablet 3   furosemide (LASIX) 40 MG tablet Take 1 tablet (40 mg total) by mouth every other day. 90 tablet 1   isosorbide mononitrate (IMDUR) 60 MG 24 hr tablet TAKE 1 TABLET(60 MG) BY MOUTH DAILY 90 tablet 3   ketorolac (ACULAR) 0.5 % ophthalmic solution  Place 1 drop into the right eye 3 (three) times daily.     Lancets (ONETOUCH DELICA PLUS HLKTGY56L) MISC CHECK BLOOD SUGAR BEFORE BREAKFAST AND DINNER 100 each 1   montelukast (SINGULAIR) 10 MG tablet TAKE 1 TABLET(10  MG) BY MOUTH EVERY EVENING 90 tablet 1   Nebulizers (COMPRESSOR/NEBULIZER) MISC Use as directed 1 each 0   pantoprazole (PROTONIX) 40 MG tablet Take 40 mg by mouth daily.     Polyvinyl Alcohol-Povidone PF (REFRESH) 1.4-0.6 % SOLN Place 1-2 drops into both eyes 3 (three) times daily as needed (dry/irritated eyes.).     potassium chloride SA (KLOR-CON) 20 MEQ tablet Take 1 tablet (20 mEq total) by mouth every other day. 90 tablet 1   prednisoLONE acetate (PRED FORTE) 1 % ophthalmic suspension Place 1 drop into both eyes 4 (four) times daily.      pregabalin (LYRICA) 75 MG capsule TAKE 1 CAPSULE(75 MG) BY MOUTH TWICE DAILY 180 capsule 1   sitaGLIPtin-metformin (JANUMET) 50-500 MG tablet Take 1 tablet by mouth 2 (two) times daily with a meal. 180 tablet 1   Travoprost, BAK Free, (TRAVATAN) 0.004 % SOLN ophthalmic solution Place 1 drop into the right eye at bedtime.      umeclidinium-vilanterol (ANORO ELLIPTA) 62.5-25 MCG/INH AEPB Inhale 1 puff into the lungs daily. 14 each 0   nitroGLYCERIN (NITROSTAT) 0.4 MG SL tablet Place 1 tablet (0.4 mg total) under the tongue every 5 (five) minutes as needed for chest pain. (Patient not taking: Reported on 11/08/2020) 25 tablet prn   No current facility-administered medications on file prior to visit.    Allergies:   Allergies  Allergen Reactions   Crestor [Rosuvastatin Calcium] Other (See Comments)    muscle aches   Demerol  [Meperidine Hcl]     Other reaction(s): Hallucinations   Shellfish Allergy Itching and Other (See Comments)    Crab, shrimp and lobster ---lips itch and tingle Was told not to eat again after having a allergy test. Lobster, crab and shrimp     MR brain -chronic left thalamic lacunar infarct -negative for acute findings MR cervical - stable, mild disc bulging at C3-4 down to C6-7 EEG unremarkable Lab work WNL (sed rate, dementia panel, lipid panel and A1c)  Physical Exam Today's Vitals   11/15/20 1404  BP: (!) 151/65  Pulse:  77  Weight: 174 lb (78.9 kg)  Height: _0  (1.575 m)   Body mass index is 31.83 kg/m.   General: well developed, well nourished elderly African-American lady, seated, in no evident distress Head: head normocephalic and atraumatic.   Neck: supple with no carotid or supraclavicular bruits mild tightness of suprascapular muscles bilaterally but no restriction of neck movements. Cardiovascular: regular rate and rhythm, no murmurs Musculoskeletal: no deformity Skin:  no rash/petichiae Vascular:  Normal pulses all extremities  Neurologic Exam Mental Status: Awake and fully alert.  Fluent speech and language.  Oriented to place and time. Recent and remote memory intact. Attention span, concentration and fund of knowledge appropriate. Mood and affect appropriate.  MMSE not done  cranial Nerves: Pupils equal, briskly reactive to light. Extraocular movements full without nystagmus. Visual fields full to confrontation.  Diminished vision acuity in the right eye.  Hearing slightly diminished bilaterally. Facial sensation intact. Face, tongue, palate moves normally and symmetrically.  Motor: Normal bulk and tone. Normal strength in all tested extremity muscles. Sensory.: intact to touch , pinprick , position and vibratory sensation.  Tinel's sign positive over the right wrist. Coordination:  Rapid alternating movements normal in all extremities. Finger-to-nose and heel-to-shin performed accurately bilaterally. Gait and Station: Arises from chair without difficulty. Stance is normal. Gait demonstrates normal stride length and balance . Able to heel, toe and tandem walk with slight difficulty.  Reflexes: 1+ and symmetric. Toes downgoing.      ASSESSMENT/PLAN: 75 year old African-American lady with asymptomatic left subcortical lacunar infarct in August 2020 discovered at the time of brain imaging for vision difficulties which is unrelated and likely from glaucoma and eye problem.  Vascular risk factors of  hypertension, hyperlipidemia, diabetes and sleep apnea.  Prior visit - new complaints of worsening balance, right hand numbness, memory loss, headaches -extensive evaluation unremarkable but continues to experience frontal and left orbital headaches which appear to have mixed vascular and tension headache features.  I had a long discussion with patient with regards to her headaches which appear improved but are still bothersome at night hence I recommend she take Topamax 25 mg at night every day.  She could use extra Tylenol if needed in addition.  We also discussed doing regular neck stretching exercises.  She will return for follow-up in the future in 3 months with my nurse practitioner Janett Billow or call earlier if necessary.  I spent 30 minutes of face-to-face and non-face-to-face time with patient .  This included previsit chart review, lab review, study review, order entry, electronic health record documentation, patient education and discussion regarding complaints of left orbital headache and possible etiologies, further treatment options and recommendations, history of prior stroke as well as secondary stroke prevention measures and aggressive stroke risk factor management and answered all other questions and patient and husband satisfaction  Antony Contras, MD  [facility] [address] [place], [place] [address]  Phone [phone] Fax [phone] Note: This document was prepared with digital dictation and possible smart phrase technology. Any transcriptional errors that result from this process are unintentional.

## 2020-11-20 ENCOUNTER — Encounter (HOSPITAL_COMMUNITY)
Admission: RE | Admit: 2020-11-20 | Discharge: 2020-11-20 | Disposition: A | Discharge status: Home / Self Care | Payer: Medicare Other | Source: Ambulatory Visit | Attending: Internal Medicine | Admitting: Internal Medicine

## 2020-11-20 ENCOUNTER — Other Ambulatory Visit: Payer: Self-pay

## 2020-11-20 DIAGNOSIS — D860 Sarcoidosis of lung: Secondary | ICD-10-CM

## 2020-11-20 NOTE — Progress Notes (Signed)
Daily Session Note  Patient Details  Name: Sheila Reynolds MRN: [id number] Date of Birth: [birth year] Referring Provider:   April Manson Pulmonary Rehab Walk Test from 10/08/2020 in [place]  Referring Provider Dr. Annamaria Boots       Encounter Date: 11/20/2020  Check In:  Session Check In - 11/20/20 1116       Check-In   Supervising physician immediately available to respond to emergencies [facility] immediately available    Physician(s) Dr. Nevada Crane    Location [facility]    Staff Present Rosebud Poles, RN, Quentin Ore, MS, ACSM-CEP, Exercise Physiologist;Stevan Eberwein Vicente Serene, MS, ACSM CEP, Exercise Physiologist    Virtual Visit No    Medication changes reported     No    Fall or balance concerns reported    No    Tobacco Cessation No Change    Warm-up and Cool-down Performed as group-led instruction    Resistance Training Performed Yes    VAD Patient? No    PAD/SET Patient? No      Pain Assessment   Currently in Pain? No/denies    Pain Score 0-No pain    Multiple Pain Sites No             Capillary Blood Glucose: No results found for this or any previous visit (from the past 24 hour(s)).    Social History   Tobacco Use  Smoking Status Former   Packs/day: 1.00   Years: 4.00   Pack years: 4.00   Types: Cigarettes   Quit date: 02/11/1980   Years since quitting: 40.8  Smokeless Tobacco Never    Goals Met:  Exercise tolerated well No report of concerns or symptoms today Strength training completed today  Goals Unmet:  Not Applicable  Comments: Service time is from 1023 to 1138    Dr. Fransico Him is Medical Director for Cardiac Rehab at [facility].

## 2020-11-22 ENCOUNTER — Other Ambulatory Visit: Payer: Self-pay

## 2020-11-22 ENCOUNTER — Encounter (HOSPITAL_COMMUNITY)
Admission: RE | Admit: 2020-11-22 | Discharge: 2020-11-22 | Disposition: A | Discharge status: Home / Self Care | Payer: Medicare Other | Source: Ambulatory Visit | Attending: Internal Medicine | Admitting: Internal Medicine

## 2020-11-22 VITALS — Wt 175.3 lb

## 2020-11-22 DIAGNOSIS — D860 Sarcoidosis of lung: Secondary | ICD-10-CM | POA: Diagnosis not present

## 2020-11-22 NOTE — Progress Notes (Signed)
Daily Session Note  Patient Details  Name: Sheila Reynolds MRN: [id number] Date of Birth: December 14, 1945 Referring Provider:   April Manson Pulmonary Rehab Walk Test from 10/08/2020 in [place]  Referring Provider Dr. Annamaria Boots       Encounter Date: 11/22/2020  Check In:  Session Check In - 11/22/20 1032       Check-In   Supervising physician immediately available to respond to emergencies [facility] immediately available    Physician(s) Dr. Verlon Au    Location [facility]    Staff Present Rosebud Poles, RN, Quentin Ore, MS, ACSM-CEP, Exercise Physiologist;Siddhartha Hoback Ysidro Evert, RN    Virtual Visit No    Medication changes reported     No    Fall or balance concerns reported    No    Tobacco Cessation No Change    Warm-up and Cool-down Performed as group-led instruction    Resistance Training Performed Yes    VAD Patient? No    PAD/SET Patient? No      Pain Assessment   Currently in Pain? No/denies    Multiple Pain Sites No             Capillary Blood Glucose: No results found for this or any previous visit (from the past 24 hour(s)).    Social History   Tobacco Use  Smoking Status Former   Packs/day: 1.00   Years: 4.00   Pack years: 4.00   Types: Cigarettes   Quit date: 02/11/1980   Years since quitting: 40.8  Smokeless Tobacco Never    Goals Met:  Exercise tolerated well No report of concerns or symptoms today Strength training completed today  Goals Unmet:  Not Applicable  Comments: Service time is from 1020 to 1140    Dr. Fransico Him is Medical Director for Cardiac Rehab at [facility].

## 2020-11-25 NOTE — Chronic Care Management (AMB) (Signed)
Chronic Care Management   CCM RN Visit Note  11/12/2020 Name: Sheila Reynolds MRN: [id number] DOB: Dec 09, 1945  Subjective: Sheila Reynolds is a 75 y.o. year old female who is a primary care patient of Glendale Chard, MD. The care management team was consulted for assistance with disease management and care coordination needs.    Engaged with patient by telephone for follow up visit in response to provider referral for case management and/or care coordination services.   Consent to Services:  The patient was given information about Chronic Care Management services, agreed to services, and gave verbal consent prior to initiation of services.  Please see initial visit note for detailed documentation.   Patient agreed to services and verbal consent obtained.   Assessment: Review of patient past medical history, allergies, medications, health status, including review of consultants reports, laboratory and other test data, was performed as part of comprehensive evaluation and provision of chronic care management services.   SDOH (Social Determinants of Health) assessments and interventions performed:  Yes, no acute needs   CCM Care Plan  Allergies  Allergen Reactions   Crestor [Rosuvastatin Calcium] Other (See Comments)    muscle aches   Demerol  [Meperidine Hcl]     Other reaction(s): Hallucinations   Shellfish Allergy Itching and Other (See Comments)    Crab, shrimp and lobster ---lips itch and tingle Was told not to eat again after having a allergy test. Lobster, crab and shrimp     Outpatient Encounter Medications as of 11/12/2020  Medication Sig   acetaminophen (TYLENOL) 500 MG tablet Take 1,000 mg by mouth 2 (two) times daily as needed for moderate pain or headache.   albuterol (PROVENTIL) (2.5 MG/3ML) 0.083% nebulizer solution Take 3 mLs (2.5 mg total) by nebulization daily as needed for wheezing or shortness of breath.   albuterol (VENTOLIN HFA) 108 (90 Base) MCG/ACT  inhaler Inhale 2 puffs into the lungs every 6 (six) hours as needed for wheezing or shortness of breath.   aspirin EC 81 MG tablet Take 81 mg by mouth daily. Swallow whole.   Blood Glucose Monitoring Suppl (ACCU-CHEK AVIVA PLUS) w/Device KIT Use to check blood sugars 3 times a day. Dx code e11.65   brimonidine (ALPHAGAN P) 0.1 % SOLN Place 1 drop into both eyes 2 (two) times daily.    brinzolamide (AZOPT) 1 % ophthalmic suspension Place 1 drop into both eyes 3 (three) times daily.   carvedilol (COREG) 12.5 MG tablet TAKE 1 TABLET(12.5 MG) BY MOUTH TWICE DAILY   clopidogrel (PLAVIX) 75 MG tablet Take 1 tablet (75 mg total) by mouth daily.   EPINEPHrine 0.3 mg/0.3 mL IJ SOAJ injection Inject 0.3 mg into the muscle as needed for anaphylaxis.   ezetimibe (ZETIA) 10 MG tablet Take 1 tablet (10 mg total) by mouth daily.   furosemide (LASIX) 40 MG tablet Take 1 tablet (40 mg total) by mouth every other day.   isosorbide mononitrate (IMDUR) 60 MG 24 hr tablet TAKE 1 TABLET(60 MG) BY MOUTH DAILY   ketorolac (ACULAR) 0.5 % ophthalmic solution Place 1 drop into the right eye 3 (three) times daily.   Lancets (ONETOUCH DELICA PLUS [id number]) MISC CHECK BLOOD SUGAR BEFORE BREAKFAST AND DINNER   montelukast (SINGULAIR) 10 MG tablet TAKE 1 TABLET(10 MG) BY MOUTH EVERY EVENING   Nebulizers (COMPRESSOR/NEBULIZER) MISC Use as directed   pantoprazole (PROTONIX) 40 MG tablet Take 40 mg by mouth daily.   Polyvinyl Alcohol-Povidone PF (REFRESH) 1.4-0.6 % SOLN Place 1-2 drops  into both eyes 3 (three) times daily as needed (dry/irritated eyes.).   potassium chloride SA (KLOR-CON) 20 MEQ tablet Take 1 tablet (20 mEq total) by mouth every other day.   prednisoLONE acetate (PRED FORTE) 1 % ophthalmic suspension Place 1 drop into both eyes 4 (four) times daily.    pregabalin (LYRICA) 75 MG capsule TAKE 1 CAPSULE(75 MG) BY MOUTH TWICE DAILY   sitaGLIPtin-metformin (JANUMET) 50-500 MG tablet Take 1 tablet by mouth 2 (two)  times daily with a meal.   Travoprost, BAK Free, (TRAVATAN) 0.004 % SOLN ophthalmic solution Place 1 drop into the right eye at bedtime.    umeclidinium-vilanterol (ANORO ELLIPTA) 62.5-25 MCG/INH AEPB Inhale 1 puff into the lungs daily.   [DISCONTINUED] atorvastatin (LIPITOR) 80 MG tablet TAKE 1 TABLET(80 MG) BY MOUTH DAILY   [DISCONTINUED] topiramate (TOPAMAX) 25 MG tablet Take 1 tablet (25 mg total) by mouth 2 (two) times daily.   No facility-administered encounter medications on file as of 11/12/2020.    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) ([place]) 04/13/2020   Chronic diastolic heart failure ([place]) 03/28/2020   Uncontrolled type 2 diabetes mellitus with hyperglycemia ([place]) 07/25/2019   Hiatal hernia 07/25/2019   Spontaneous ecchymoses 07/25/2019   Class 1 obesity due to excess calories with serious comorbidity and body mass index (BMI) of 32.0 to 32.9 in adult 07/25/2019   Peripheral vascular disease, unspecified ([place]) 03/23/2019   Uveitis 06/01/2017   Fibromyalgia 06/01/2017   DDD (degenerative disc disease), lumbar 06/01/2017   DDD (degenerative disc disease), thoracic 06/01/2017   History of hypercholesterolemia 06/01/2017   History of anxiety 06/01/2017   History of IBS 06/01/2017   Premature atrial contractions 03/30/2017   PVC's (premature ventricular contractions) 03/30/2017   Diabetes mellitus without complication ([place]) 86/57/8469   Angina pectoris ([place]) 01/15/2017   CAD (coronary artery disease) 01/12/2017   Shortness of breath 12/12/2016   Acute maxillary sinusitis 05/03/2014   Obstructive sleep apnea 11/04/2013   Multiple lung nodules 07/18/2012   Heart palpitations 03/11/2012   HTN (hypertension) 03/11/2012   Gastroesophageal reflux disease without esophagitis  07/13/2011   Sarcoidosis 02/08/2011   COPD with acute exacerbation ([place]) 02/05/2011   Hyperlipemia 09/24/2010   Atypical chest pain 09/24/2010   NSVT (nonsustained ventricular tachycardia) ([place]) 09/24/2010   Leg edema 09/24/2010    Conditions to be addressed/monitored: Uncontrolled type two DM with hyperglycemia, Vitamin D deficiency,  Fibromyalgia, Hypertension, Peripheral Vascular disease, Lymphedema of both lower extremities, Sarcoidosis  Care Plan : Diabetes Type 2 (Adult)  Updates made by Lynne Logan, RN since 11/12/2020 12:00 AM     Problem: Glycemic Management (Diabetes, Type 2)   Priority: High     Long-Range Goal: Glycemic Management Optimized   Start Date: 08/07/2020  Expected End Date: 08/07/2021  Recent Progress: On track  Priority: High  Note:   Objective:  Lab Results  Component Value Date   HGBA1C 6.3 (H) 11/08/2020   Lab Results  Component Value Date   CREATININE 0.88 11/14/2020   CREATININE 0.86 11/08/2020   CREATININE 0.81 08/01/2020   Lab Results  Component Value Date   EGFR 69 11/14/2020  Current Barriers:  Knowledge Deficits related to basic Diabetes pathophysiology and self care/management Knowledge Deficits related to medications used for management of diabetes Case Manager Clinical Goal(s):  patient will  demonstrate improved adherence to prescribed treatment plan for diabetes self care/management as evidenced by: daily monitoring and recording of CBG  adherence to ADA/ carb modified diet exercise 5 days/week adherence to prescribed medication regimen contacting provider for new or worsened symptoms or questions Interventions:  11/12/20 completed successful outbound call with patient  Collaboration with Glendale Chard, MD regarding development and update of comprehensive plan of care as evidenced by provider attestation and co-signature Inter-disciplinary care team collaboration (see longitudinal plan of care) Provided education to patient about  basic DM disease process Review of patient status, including review of consultant's reports, relevant laboratory and other test results, and medications completed. Reviewed medications with patient and discussed importance of medication adherence Educated patient on dietary and exercise recommendations; daily glycemic control FBS 80-130, <180 after meals;15'15' rule Advised patient, providing education and rationale, to check cbg daily before meals and at bedtime and record, calling the CCM team and or PCP for findings outside established parameters Discussed plans with patient for ongoing care management follow up and provided patient with direct contact information for care management team Self-Care Activities Self administers oral medications as prescribed Attends all scheduled provider appointments Checks blood sugars as prescribed and utilize hyper and hypoglycemia protocol as needed Adheres to prescribed ADA/carb modified Patient Goals: - check blood sugar at prescribed times - check blood sugar if I feel it is too high or too low - enter blood sugar readings and medication or insulin into daily log - take the blood sugar log to all doctor visits - take the blood sugar meter to all doctor visits - manage portion size - keep appointment with eye doctor - schedule appointment with eye doctor - check feet daily for cuts, sores or redness - do heel pump exercise 2 to 3 times each day - keep feet up while sitting - trim toenails straight across - wash and dry feet carefully every day - wear comfortable, cotton socks - wear comfortable, well-fitting shoes  Follow Up Plan: Telephone follow up appointment with care management team member scheduled for: 01/14/21    Care Plan : Lymphedema  Updates made by Lynne Logan, RN since 11/12/2020 12:00 AM     Problem: Lymphedema   Priority: High     Long-Range Goal: Lymphedema - complications prevented or minimized   Start Date:  08/07/2020  Expected End Date: 08/07/2021  Priority: High  Note:   Current Barriers:  Ineffective Self Health Maintenance  Clinical Goal(s):  Collaboration with Glendale Chard, MD regarding development and update of comprehensive plan of care as evidenced by provider attestation and co-signature Inter-disciplinary care team collaboration (see longitudinal plan of care) patient will work with care management team to address care coordination and chronic disease management needs related to Disease Management Educational Needs Care Coordination Medication Management and Education Psychosocial Support   Interventions:  11/12/20 completed successful outbound call with patient  Evaluation of current treatment plan related to  Lymphedema , self-management and patient's adherence to plan as established by provider. Collaboration with Glendale Chard, MD regarding development and update of comprehensive plan of care as evidenced by provider attestation       and co-signature Inter-disciplinary care team collaboration (see longitudinal plan of care) Determined patient completed outpatient rehabilitation at [facility] for treatment of lymphedema to her lower extremities on 07/24/20, bilaterally with the following noted: Current functional level related to goals / functional outcomes: See below  Remaining deficits: Slight edema Education / Equipment: HEP, the importance of using  her pump and garments daily   Patient agrees to discharge. Patient goals were met. Patient is being discharged due to meeting the stated rehab goals.  Educated patient on disease process and long term management of lymphedema Determined patient is adhering to the home treatment plan provided by MD and outpatient PT, she feels her lymphedema is better managed at this point in time Educated on prevention of complications and when to call the doctor  Determined patient is concerned about a small discoloration noted on her lower  extremity, it is tender to press on, this occurred "a while ago" but she did not mention it to Dr. Baird Cancer at last visit due to forgetting to discuss it Collaborated with Dr. Baird Cancer regarding patients concerns, the office will call patient to schedule a follow up for evaluation of this issue Discussed plans with patient for ongoing care management follow up and provided patient with direct contact information for care management team Self Care Activities:  Patient verbalizes understanding of plan to use compression wraps and lymphedema pumps as directed Self administers medications as prescribed Attends all scheduled provider appointments Calls pharmacy for medication refills Calls provider office for new concerns or questions Patient Goals: - prevent or minimize complications secondary to bilateral lower extremity lymphedema - follow up with PCP for evaluation of discoloration and pain to lower extremity   Follow Up Plan: Telephone follow up appointment with care management team member scheduled for: 01/14/21     Care Plan : Vitamin D deficiency  Updates made by Lynne Logan, RN since 11/12/2020 12:00 AM     Problem: Vitamin D deficiency   Priority: Medium     Long-Range Goal: Vitamin D deficiency - treatment optimized   Start Date: 08/07/2020  Expected End Date: 08/07/2021  Recent Progress: On track  Priority: Medium  Note:   Current Barriers:  Ineffective Self Health Maintenance  Clinical Goal(s):  Collaboration with Glendale Chard, MD regarding development and update of comprehensive plan of care as evidenced by provider attestation and co-signature Inter-disciplinary care team collaboration (see longitudinal plan of care) patient will work with care management team to address care coordination and chronic disease management needs related to Disease Management Educational Needs Care Coordination Medication Management and Education Psychosocial Support   Interventions:   11/12/20 completed successful outbound call with patient  Evaluation of current treatment plan related to  Vitamin D deficiency , self-management and patient's adherence to plan as established by provider. Collaboration with Glendale Chard, MD regarding development and update of comprehensive plan of care as evidenced by provider attestation       and co-signature Inter-disciplinary care team collaboration (see longitudinal plan of care) Provided education to patient about basic disease process related to Vitamin D deficiency Review of patient status, including review of consultant's reports, relevant laboratory and other test results, and medications completed. Reviewed medications with patient and discussed importance of medication adherence Educated on dietary recommendations, get at least 15 min of natural sunlight when possible, take Vitamin D supplement as directed  Discussed plans with patient for ongoing care management follow up and provided patient with direct contact information for care management team Self Care Activities:  Continue to keep all scheduled follow up appointments Take medications as directed  Let your healthcare team know if you are unable to take your medications Call your pharmacy for refills at least 7 days prior to running out of medication Patient Goals: - eat Vitamin D rich foods - get 15 minutes of natural  sunlight when possible - take Vitamin D supplement as directed   Follow Up Plan: Telephone follow up appointment with care management team member scheduled for: 01/14/21     Care Plan : Pulmonary Sarcoidosis  Updates made by Lynne Logan, RN since 11/12/2020 12:00 AM     Problem: Pulmonary Sarcoidosis   Priority: High     Long-Range Goal: Pulmonary Sarcoidosis complications prevented or minimized   Start Date: 11/12/2020  Expected End Date: 11/12/2021  This Visit's Progress: On track  Priority: High  Note:   Current Barriers:  Ineffective  Self Health Maintenance in a patient with Pulmonary Disease Clinical Goal(s):  Collaboration with Glendale Chard, MD regarding development and update of comprehensive plan of care as evidenced by provider attestation and co-signature Inter-disciplinary care team collaboration (see longitudinal plan of care) patient will work with care management team to address care coordination and chronic disease management needs related to Disease Management Educational Needs Care Coordination Medication Management and Education Medication Reconciliation Psychosocial Support   Interventions:  Evaluation of current treatment plan related to  Pulmonary Sarcoidosis , self-management and patient's adherence to plan as established by provider. Collaboration with Glendale Chard, MD regarding development and update of comprehensive plan of care as evidenced by provider attestation       and co-signature Inter-disciplinary care team collaboration (see longitudinal plan of care) Provided education to patient about basic disease process related to Pulmonary Sarcoidosis Review of patient status, including review of consultant's reports, relevant laboratory and other test results, and medications completed. Reviewed medications with patient and discussed importance of medication adherence Reviewed and discussed the follow up with per Dr. Annamaria Boots completed on 08/15/20:    Increased dyspnea and cough- unclear if this relates to Covid in April, or can respond to Anoro. Consider if earlier CT, check for clot, etc. Plan- resume Anoro and report is insufficient.   Benefits from CPAP with good compliance and control Plan- replace old machine, change to auto 5-15 as discussed  Order- Sample x 1 Anoro inhaler    inhale 1 puff, once daily   Script sent for Anoro inhaler   Order- DME [facility]- please replace old CPAP machine ([place] if [facility] not available) Auto 5-15, mask of choice, humidifier, supplies, Airview/ card   Ms Osterberg-  please let me know if the Anoro doesn't help.  Return in about 6 months (around 02/15/2021). Reviewed medications with patient and discussed importance of medication adherence Determined patient is completing Pulmonary Rehab as directed with good effectiveness Discussed plans with patient for ongoing care management follow up and provided patient with direct contact information for care management team Self Care Activities:  Self administers medications as prescribed Attends all scheduled provider appointments Calls pharmacy for medication refills Calls provider office for new concerns or questions Patient Goals: - adhere to using inhalers exactly as directed - adhere to use of CPAP as directed - continue Pulmonary Rehab as directed   Follow Up Plan: Telephone follow up appointment with care management team member scheduled for: 01/14/21     Plan:Telephone follow up appointment with care management team member scheduled for:  01/14/21  Barb Merino, RN, BSN, CCM Care Management Coordinator [place] [facility]  Direct Phone: [phone]

## 2020-11-25 NOTE — Patient Instructions (Signed)
Visit Information  PATIENT GOALS:  Goals Addressed      Lympedema - complications prevented or minimzed   On track    Timeframe:  Long-Range Goal Priority:  High Start Date: 08/07/20                            Expected End Date: 08/07/21  Follow up date: 01/14/21      Self Care Activities:  Patient verbalizes understanding of plan to use compression wraps and lymphedema pumps as directed Self administers medications as prescribed Attends all scheduled provider appointments Calls pharmacy for medication refills Calls provider office for new concerns or questions Patient Goals: - prevent or minimize complications secondary to bilateral lower extremity lymphedema   - follow up with PCP for evaluation of discoloration and pain to lower extremity                    Monitor and Manage My Blood Sugar-Diabetes Type 2   On track    Timeframe:  Long-Range Goal Priority:  High Start Date:  08/07/20                           Expected End Date:  08/07/21                     Follow Up Date: 01/14/21    Self-Care Activities Self administers oral medications as prescribed Attends all scheduled provider appointments Checks blood sugars as prescribed and utilize hyper and hypoglycemia protocol as needed Adheres to prescribed ADA/carb modified Patient Goals: - check blood sugar at prescribed times - check blood sugar if I feel it is too high or too low - enter blood sugar readings and medication or insulin into daily log - take the blood sugar log to all doctor visits - take the blood sugar meter to all doctor visits - manage portion size   Why is this important?   Checking your blood sugar at home helps to keep it from getting very high or very low.  Writing the results in a diary or log helps the doctor know how to care for you.  Your blood sugar log should have the time, date and the results.  Also, write down the amount of insulin or other medicine that you take.  Other information, like  what you ate, exercise done and how you were feeling, will also be helpful.     Notes:      Obtain Eye Exam-Diabetes Type 2       Timeframe:  Long-Range Goal Priority:  Medium Start Date: 08/07/20                            Expected End Date:  08/07/21                     Follow Up Date: 01/14/21   Self-Care Activities Self administers oral medications as prescribed Attends all scheduled provider appointments Checks blood sugars as prescribed and utilize hyper and hypoglycemia protocol as needed Adheres to prescribed ADA/carb modified Patient Goals: - keep appointment with eye doctor - schedule appointment with eye doctor   Why is this important?   Eye check-ups are important when you have diabetes.  Vision loss can be prevented.    Notes:      Perform Foot Care-Diabetes Type 2  Timeframe:  Long-Range Goal Priority:  Medium Start Date:  08/07/20                           Expected End Date:  08/07/21                     Follow Up Date: 01/14/21    Self-Care Activities Self administers oral medications as prescribed Attends all scheduled provider appointments Checks blood sugars as prescribed and utilize hyper and hypoglycemia protocol as needed Adheres to prescribed ADA/carb modified Patient Goals: - check feet daily for cuts, sores or redness - do heel pump exercise 2 to 3 times each day - keep feet up while sitting - trim toenails straight across - wash and dry feet carefully every day - wear comfortable, cotton socks - wear comfortable, well-fitting shoes   Why is this important?   Good foot care is very important when you have diabetes.  There are many things you can do to keep your feet healthy and catch a problem early.    Notes:      Pulmonary Sarcoidosis complications prevented or minimized   On track    Timeframe:  Long-Range Goal Priority:  High Start Date:  11/12/20                           Expected End Date: 11/12/21  Follow up date: 01/14/21      Patient Goals: - adhere to using inhalers exactly as directed - adhere to use of CPAP as directed - continue Pulmonary Rehab as directed                    Vitamin D deficiency - treatment optimized   On track    Timeframe:  Long-Range Goal Priority:  Medium Start Date:  08/07/20                           Expected End Date:  08/07/21  Next Scheduled follow up: 01/14/21       Self Care Activities:  Continue to keep all scheduled follow up appointments Take medications as directed  Let your healthcare team know if you are unable to take your medications Call your pharmacy for refills at least 7 days prior to running out of medication Patient Goals: - eat Vitamin D rich foods - get 15 minutes of natural sunlight when possible - take Vitamin D supplement as directed                    The patient verbalized understanding of instructions, educational materials, and care plan provided today and declined offer to receive copy of patient instructions, educational materials, and care plan.   Telephone follow up appointment with care management team member scheduled for: 01/14/21  Barb Merino, RN, BSN, CCM Care Management Coordinator [place] [facility]  Direct Phone: [phone]

## 2020-11-27 ENCOUNTER — Encounter (HOSPITAL_COMMUNITY): Payer: Medicare Other

## 2020-11-27 ENCOUNTER — Other Ambulatory Visit: Payer: Medicare Other

## 2020-11-27 NOTE — Progress Notes (Addendum)
Pulmonary Individual Treatment Plan  Patient Details  Name: Sheila Reynolds MRN: [id number] Date of Birth: Jun 14, 1945 Referring Provider:   April Manson Pulmonary Rehab Walk Test from 10/08/2020 in [facility]  Referring Provider Dr. 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[place])  Patient's Home Medications on Admission:   Current Outpatient Medications:    acetaminophen (TYLENOL) 500 MG tablet, Take 1,000 mg by mouth 2 (two) times daily as needed for moderate pain or headache., Disp: , Rfl:    albuterol (PROVENTIL) (2.5 MG/3ML) 0.083% nebulizer solution, Take 3 mLs (2.5 mg total) by nebulization daily as needed for wheezing or shortness of breath., Disp: 75 mL, Rfl: 12   albuterol (VENTOLIN HFA) 108 (90 Base) MCG/ACT inhaler, Inhale 2 puffs into the lungs every 6 (six) hours as needed for wheezing or shortness of breath., Disp: 18 g, Rfl: 12   aspirin EC 81 MG tablet, Take 81 mg by mouth daily. Swallow whole., Disp: , Rfl:    Blood Glucose Monitoring Suppl (ACCU-CHEK AVIVA PLUS) w/Device KIT, Use to check blood sugars 3 times a day. Dx code e11.65, Disp: 1 kit, Rfl: 3   brimonidine (ALPHAGAN P) 0.1 % SOLN, Place 1 drop into both eyes 2 (two) times daily. , Disp: , Rfl:    brinzolamide (AZOPT) 1 % ophthalmic suspension, Place 1 drop into both eyes 3 (three) times daily., Disp: , Rfl:    carvedilol (COREG) 12.5 MG tablet, TAKE 1 TABLET(12.5 MG) BY MOUTH TWICE DAILY, Disp: 180 tablet, Rfl: 3   clopidogrel (PLAVIX) 75 MG tablet, Take 1 tablet (75 mg total) by mouth daily., Disp: 90 tablet, Rfl: 3   EPINEPHrine 0.3 mg/0.3 mL IJ SOAJ injection, Inject 0.3 mg into the muscle as needed for anaphylaxis., Disp: 1 each, Rfl: 0   ezetimibe (ZETIA) 10 MG tablet, Take 1 tablet (10 mg total) by mouth daily.,  Disp: 90 tablet, Rfl: 3   furosemide (LASIX) 40 MG tablet, Take 1 tablet (40 mg total) by mouth every other day., Disp: 90 tablet, Rfl: 1   isosorbide mononitrate (IMDUR) 60 MG 24 hr tablet, TAKE 1 TABLET(60 MG) BY MOUTH DAILY, Disp: 90 tablet, Rfl: 3   ketorolac (ACULAR) 0.5 % ophthalmic solution, Place 1 drop into the right eye 3 (three) times daily., Disp: , Rfl:    Lancets (ONETOUCH DELICA PLUS [id number]) MISC, CHECK BLOOD SUGAR BEFORE BREAKFAST AND DINNER, Disp: 100 each, Rfl: 1   montelukast (SINGULAIR) 10 MG tablet, TAKE 1 TABLET(10 MG) BY MOUTH EVERY EVENING, Disp: 90 tablet, Rfl: 1   Nebulizers (COMPRESSOR/NEBULIZER) MISC, Use as directed, Disp: 1 each, Rfl: 0   nitroGLYCERIN (NITROSTAT) 0.4 MG SL tablet, Place 1 tablet (0.4 mg total) under the tongue every 5 (five) minutes as needed for chest pain. (Patient not taking: Reported on 11/08/2020), Disp: 25 tablet, Rfl: prn   pantoprazole (PROTONIX) 40 MG tablet, Take 40 mg by mouth daily., Disp: , Rfl:    Polyvinyl Alcohol-Povidone PF (REFRESH) 1.4-0.6 % SOLN, Place 1-2 drops into both eyes 3 (three) times daily as needed (dry/irritated eyes.)., Disp: , Rfl:    potassium chloride SA (KLOR-CON) 20 MEQ tablet, Take 1 tablet (20 mEq total) by mouth every other day., Disp: 90 tablet, Rfl: 1   prednisoLONE acetate (PRED FORTE) 1 % ophthalmic  suspension, Place 1 drop into both eyes 4 (four) times daily. , Disp: , Rfl:    pregabalin (LYRICA) 75 MG capsule, TAKE 1 CAPSULE(75 MG) BY MOUTH TWICE DAILY, Disp: 180 capsule, Rfl: 1   sitaGLIPtin-metformin (JANUMET) 50-500 MG tablet, Take 1 tablet by mouth 2 (two) times daily with a meal., Disp: 180 tablet, Rfl: 1   topiramate (TOPAMAX) 25 MG tablet, Take 1 tablet (25 mg total) by mouth at bedtime., Disp: 120 tablet, Rfl: 3   Travoprost, BAK Free, (TRAVATAN) 0.004 % SOLN ophthalmic solution, Place 1 drop into the right eye at bedtime. , Disp: , Rfl:    umeclidinium-vilanterol (ANORO ELLIPTA) 62.5-25 MCG/INH  AEPB, Inhale 1 puff into the lungs daily., Disp: 14 each, Rfl: 0  Past Medical History: Past Medical History:  Diagnosis Date   Anemia    3 months ago anemic   Anxiety    on meds   Arthritis    "all over" (01/15/2017)   Asthma    Bronchitis with emphysema    Chest pain    Chronic bronchitis ([place])    Coronary artery disease    a. 01/2017 she underwent orbital atherectomy/DES to the proxmal LAD and PTCA to ostial D2. 2D Echo 01/15/17 showed mild LVH, EF 60-65%, grade 1 DD.   Family history of anesthesia complication    daughter N/V   Fibromyalgia    GERD (gastroesophageal reflux disease)    on meds   Glaucoma    Heart murmur    History of hiatal hernia    Hx of echocardiogram    Echo (03/2013):  Tech limited; Mild focal basal septal hypertrophy, EF 60-65%, normal RVF   Hyperlipidemia    Hypertension    Nonsustained ventricular tachycardia    OSA on CPAP    Premature atrial contractions    PVC (premature ventricular contraction)    a. Holter 12/16: NSR, occ PAC,PVCs   Sarcoidosis    Type II diabetes mellitus (HCC)     Tobacco Use: Social History   Tobacco Use  Smoking Status Former   Packs/day: 1.00   Years: 4.00   Pack years: 4.00   Types: Cigarettes   Quit date: 02/11/1980   Years since quitting: 40.8  Smokeless Tobacco Never    Labs: Recent Review Flowsheet Data     Labs for ITP Cardiac and Pulmonary Rehab Latest Ref Rng & Units 11/28/2019 03/28/2020 04/03/2020 08/01/2020 11/08/2020   Cholestrol 100 - 199 mg/dL - - 152 - -   LDLCALC 0 - 99 mg/dL - - 89 - -   HDL >39 mg/dL - - 44 - -   Trlycerides 0 - 149 mg/dL - - 104 - -   Hemoglobin A1c 4.8 - 5.6 % 6.3(H) 6.1(H) 6.0(H) 6.1(H) 6.3(H)   PHART 7.350 - 7.450 - - - - -   PCO2ART 32.0 - 48.0 mmHg - - - - -   HCO3 20.0 - 28.0 mmol/L - - - - -   TCO2 22 - 32 mmol/L - - - - -   O2SAT % - - - - -       Capillary Blood Glucose: Lab Results  Component Value Date   GLUCAP 80 10/18/2020   GLUCAP 130 (H) 10/18/2020    GLUCAP 103 (H) 10/16/2020   GLUCAP 155 (H) 10/16/2020   GLUCAP 118 (H) 01/10/2020    POCT Glucose     Row Name 10/16/20 1202             POCT  Blood Glucose   Pre-Exercise 155 mg/dL       Post-Exercise 130 mg/dL                Pulmonary Assessment Scores:  Pulmonary Assessment Scores     Row Name 10/08/20 1142         ADL UCSD   ADL Phase Entry     SOB Score total 50           CAT Score   CAT Score 29           mMRC Score   mMRC Score 4             UCSD: Self-administered rating of dyspnea associated with activities of daily living (ADLs) 6-point scale (0 = "not at all" to 5 = "maximal or unable to do because of breathlessness")  Scoring Scores range from 0 to 120.  Minimally important difference is 5 units  CAT: CAT can identify the health impairment of COPD patients and is better correlated with disease progression.  CAT has a scoring range of zero to 40. The CAT score is classified into four groups of low (less than 10), medium (10 - 20), high (21-30) and very high (31-40) based on the impact level of disease on health status. A CAT score over 10 suggests significant symptoms.  A worsening CAT score could be explained by an exacerbation, poor medication adherence, poor inhaler technique, or progression of COPD or comorbid conditions.  CAT MCID is 2 points  mMRC: mMRC (Modified Medical Research Council) Dyspnea Scale is used to assess the degree of baseline functional disability in patients of respiratory disease due to dyspnea. No minimal important difference is established. A decrease in score of 1 point or greater is considered a positive change.   Pulmonary Function Assessment:  Pulmonary Function Assessment - 10/08/20 1019       Breath   Bilateral Breath Sounds Clear    Shortness of Breath Yes;Limiting activity             Exercise Target Goals: Exercise Program Goal: Individual exercise prescription set using results from initial 6  min walk test and THRR while considering  patient's activity barriers and safety.   Exercise Prescription Goal: Initial exercise prescription builds to 30-45 minutes a day of aerobic activity, 2-3 days per week.  Home exercise guidelines will be given to patient during program as part of exercise prescription that the participant will acknowledge.  Activity Barriers & Risk Stratification:  Activity Barriers & Cardiac Risk Stratification - 10/08/20 1017       Activity Barriers & Cardiac Risk Stratification   Activity Barriers Arthritis;Joint Problems;Deconditioning;Muscular Weakness;Shortness of Breath;Balance Concerns;History of Falls             6 Minute Walk:  6 Minute Walk     Row Name 10/08/20 1105         6 Minute Walk   Phase Initial     Distance 935 feet  Used rollator     Walk Time 6 minutes     # of Rest Breaks 0     MPH 1.77     METS 1.8     RPE 14     Perceived Dyspnea  2     VO2 Peak 6.3     Resting HR 89 bpm     Resting BP 122/70     Resting Oxygen Saturation  99 %     Exercise Oxygen Saturation  during 6 min  walk 98 %     Max Ex. HR 104 bpm     Max Ex. BP 130/80     2 Minute Post BP 124/70           Interval HR   1 Minute HR 96     2 Minute HR 100     3 Minute HR 101     4 Minute HR 104     5 Minute HR 102     6 Minute HR 102     2 Minute Post HR 69     Interval Heart Rate? Yes           Interval Oxygen   Interval Oxygen? Yes     Baseline Oxygen Saturation % 99 %     1 Minute Oxygen Saturation % 100 %     1 Minute Liters of Oxygen 0 L     2 Minute Oxygen Saturation % 99 %     2 Minute Liters of Oxygen 0 L     3 Minute Oxygen Saturation % 98 %     3 Minute Liters of Oxygen 0 L     4 Minute Oxygen Saturation % 100 %     4 Minute Liters of Oxygen 0 L     5 Minute Oxygen Saturation % 100 %     5 Minute Liters of Oxygen 0 L     6 Minute Oxygen Saturation % 98 %     6 Minute Liters of Oxygen 0 L     2 Minute Post Oxygen Saturation % 99 %      2 Minute Post Liters of Oxygen 0 L              Oxygen Initial Assessment:  Oxygen Initial Assessment - 10/08/20 1018       Home Oxygen   Home Oxygen Device None    Sleep Oxygen Prescription CPAP    Liters per minute 0    Home Exercise Oxygen Prescription None    Home Resting Oxygen Prescription None    Compliance with Home Oxygen Use Yes      Intervention   Short Term Goals To learn and understand importance of monitoring SPO2 with pulse oximeter and demonstrate accurate use of the pulse oximeter.;To learn and understand importance of maintaining oxygen saturations>88%;To learn and demonstrate proper pursed lip breathing techniques or other breathing techniques. ;To learn and demonstrate proper use of respiratory medications             Oxygen Re-Evaluation:  Oxygen Re-Evaluation     Row Name 10/19/20 0929 11/13/20 0706           Home Oxygen   Home Oxygen Device None None      Sleep Oxygen Prescription CPAP CPAP      Liters per minute 0 0      Home Exercise Oxygen Prescription None None      Home Resting Oxygen Prescription None None      Compliance with Home Oxygen Use Yes Yes             Goals/Expected Outcomes   Short Term Goals To learn and understand importance of monitoring SPO2 with pulse oximeter and demonstrate accurate use of the pulse oximeter.;To learn and understand importance of maintaining oxygen saturations>88%;To learn and demonstrate proper pursed lip breathing techniques or other breathing techniques. ;To learn and demonstrate proper use of respiratory medications To learn and understand importance of monitoring SPO2 with pulse  oximeter and demonstrate accurate use of the pulse oximeter.;To learn and understand importance of maintaining oxygen saturations>88%;To learn and demonstrate proper pursed lip breathing techniques or other breathing techniques. ;To learn and demonstrate proper use of respiratory medications      Goals/Expected Outcomes  Compliance and understanding of monitoring oxygen saturation and importance of pursed lip breathing Compliance and understanding of monitoring oxygen saturation and importance of breathing techniques to decrease shortness of breath.               Oxygen Discharge (Final Oxygen Re-Evaluation):  Oxygen Re-Evaluation - 11/13/20 0706       Home Oxygen   Home Oxygen Device None    Sleep Oxygen Prescription CPAP    Liters per minute 0    Home Exercise Oxygen Prescription None    Home Resting Oxygen Prescription None    Compliance with Home Oxygen Use Yes      Goals/Expected Outcomes   Short Term Goals To learn and understand importance of monitoring SPO2 with pulse oximeter and demonstrate accurate use of the pulse oximeter.;To learn and understand importance of maintaining oxygen saturations>88%;To learn and demonstrate proper pursed lip breathing techniques or other breathing techniques. ;To learn and demonstrate proper use of respiratory medications    Goals/Expected Outcomes Compliance and understanding of monitoring oxygen saturation and importance of breathing techniques to decrease shortness of breath.             Initial Exercise Prescription:  Initial Exercise Prescription - 10/08/20 1100       Date of Initial Exercise RX and Referring Provider   Date 10/08/20    Referring Provider Dr. Annamaria Boots    Expected Discharge Date 12/13/20      NuStep   Level 1    SPM 70    Minutes 15      Arm Ergometer   Level 1    Watts 10    Minutes 15      Prescription Details   Frequency (times per week) 2    Duration Progress to 30 minutes of continuous aerobic without signs/symptoms of physical distress      Intensity   THRR 40-80% of Max Heartrate 59-118    Ratings of Perceived Exertion 11-13    Perceived Dyspnea 0-4      Progression   Progression Continue to progress workloads to maintain intensity without signs/symptoms of physical distress.      Resistance Training    Training Prescription Yes    Weight Red bands    Reps 10-15             Perform Capillary Blood Glucose checks as needed.  Exercise Prescription Changes:   Exercise Prescription Changes     Row Name 10/16/20 1100 10/30/20 1200 11/13/20 1200 11/22/20 1207       Response to Exercise   Blood Pressure (Admit) 136/64 122/80 133/84 134/76    Blood Pressure (Exercise) 120/66 130/80 144/80 --    Blood Pressure (Exit) 100/60 126/78 118/64 136/80    Heart Rate (Admit) 83 bpm 84 bpm 79 bpm 85 bpm    Heart Rate (Exercise) 100 bpm 118 bpm 96 bpm 101 bpm    Heart Rate (Exit) 80 bpm 90 bpm 73 bpm 80 bpm    Oxygen Saturation (Admit) 97 % 100 % 99 % 98 %    Oxygen Saturation (Exercise) 97 % 97 % 99 % 96 %    Oxygen Saturation (Exit) 99 % 100 % 98 % 99 %  Rating of Perceived Exertion (Exercise) _0 Perceived Dyspnea (Exercise) 2 1 1.5 1    Duration Progress to 30 minutes of  aerobic without signs/symptoms of physical distress Progress to 30 minutes of  aerobic without signs/symptoms of physical distress Continue with 30 min of aerobic exercise without signs/symptoms of physical distress. Continue with 30 min of aerobic exercise without signs/symptoms of physical distress.    Intensity Other (comment)  40-80% of HRR THRR unchanged THRR unchanged THRR unchanged         Progression   Progression Continue to progress workloads to maintain intensity without signs/symptoms of physical distress. Continue to progress workloads to maintain intensity without signs/symptoms of physical distress. Continue to progress workloads to maintain intensity without signs/symptoms of physical distress. Continue to progress workloads to maintain intensity without signs/symptoms of physical distress.         Resistance Training   Training Prescription Yes Yes Yes Yes    Weight Red bands Red bands Red Bands Red bands    Reps 10-15 10-15 10-15 10-15    Time 10 Minutes 10 Minutes 10 Minutes 10 Minutes          NuStep   Level _1 SPM 70 70 80 80    Minutes _2 METs 1.6 1.7 1.7 1.9         Arm Ergometer   Level _3 4.2    Watts 15 26 34 38    Minutes _4 Exercise Comments:   Exercise Comments     Row Name 10/16/20 1148           Exercise Comments Patient completed first day of exercise today. Pt tolerated arm ergometer and nustep well. She did 25 watts at level 1 on the arm ergometer for 15 minutes and 1.6 METS at level 1 on the nustep for 15 minutes. She did warm up and cool down exercises without any C/O.                Exercise Goals and Review:   Exercise Goals     Row Name 10/08/20 1128 10/19/20 0930 11/13/20 0703         Exercise Goals   Increase Physical Activity Yes Yes Yes     Intervention -- Provide advice, education, support and counseling about physical activity/exercise needs.;Develop an individualized exercise prescription for aerobic and resistive training based on initial evaluation findings, risk stratification, comorbidities and participant's personal goals. Provide advice, education, support and counseling about physical activity/exercise needs.;Develop an individualized exercise prescription for aerobic and resistive training based on initial evaluation findings, risk stratification, comorbidities and participant's personal goals.     Expected Outcomes Short Term: Attend rehab on a regular basis to increase amount of physical activity.;Long Term: Add in home exercise to make exercise part of routine and to increase amount of physical activity.;Long Term: Exercising regularly at least 3-5 days a week. Short Term: Attend rehab on a regular basis to increase amount of physical activity.;Long Term: Add in home exercise to make exercise part of routine and to increase amount of physical activity.;Long Term: Exercising regularly at least 3-5 days a week. Short Term: Attend rehab on a regular basis to increase amount of  physical activity.;Long Term: Add in home exercise to make exercise part of routine and to increase amount of  physical activity.;Long Term: Exercising regularly at least 3-5 days a week.     Increase Strength and Stamina Yes Yes Yes     Intervention -- Provide advice, education, support and counseling about physical activity/exercise needs.;Develop an individualized exercise prescription for aerobic and resistive training based on initial evaluation findings, risk stratification, comorbidities and participant's personal goals. Provide advice, education, support and counseling about physical activity/exercise needs.;Develop an individualized exercise prescription for aerobic and resistive training based on initial evaluation findings, risk stratification, comorbidities and participant's personal goals.     Expected Outcomes Short Term: Increase workloads from initial exercise prescription for resistance, speed, and METs.;Short Term: Perform resistance training exercises routinely during rehab and add in resistance training at home;Long Term: Improve cardiorespiratory fitness, muscular endurance and strength as measured by increased METs and functional capacity (6MWT) Short Term: Increase workloads from initial exercise prescription for resistance, speed, and METs.;Short Term: Perform resistance training exercises routinely during rehab and add in resistance training at home;Long Term: Improve cardiorespiratory fitness, muscular endurance and strength as measured by increased METs and functional capacity (6MWT) Short Term: Increase workloads from initial exercise prescription for resistance, speed, and METs.;Short Term: Perform resistance training exercises routinely during rehab and add in resistance training at home;Long Term: Improve cardiorespiratory fitness, muscular endurance and strength as measured by increased METs and functional capacity (6MWT)     Able to understand and use rate of perceived exertion  (RPE) scale Yes Yes Yes     Intervention Provide education and explanation on how to use RPE scale Provide education and explanation on how to use RPE scale Provide education and explanation on how to use RPE scale     Expected Outcomes Short Term: Able to use RPE daily in rehab to express subjective intensity level;Long Term:  Able to use RPE to guide intensity level when exercising independently Short Term: Able to use RPE daily in rehab to express subjective intensity level;Long Term:  Able to use RPE to guide intensity level when exercising independently Short Term: Able to use RPE daily in rehab to express subjective intensity level;Long Term:  Able to use RPE to guide intensity level when exercising independently     Able to understand and use Dyspnea scale Yes Yes Yes     Intervention Provide education and explanation on how to use Dyspnea scale Provide education and explanation on how to use Dyspnea scale Provide education and explanation on how to use Dyspnea scale     Expected Outcomes Short Term: Able to use Dyspnea scale daily in rehab to express subjective sense of shortness of breath during exertion;Long Term: Able to use Dyspnea scale to guide intensity level when exercising independently Short Term: Able to use Dyspnea scale daily in rehab to express subjective sense of shortness of breath during exertion;Long Term: Able to use Dyspnea scale to guide intensity level when exercising independently Short Term: Able to use Dyspnea scale daily in rehab to express subjective sense of shortness of breath during exertion;Long Term: Able to use Dyspnea scale to guide intensity level when exercising independently     Knowledge and understanding of Target Heart Rate Range (THRR) Yes Yes Yes     Intervention Provide education and explanation of THRR including how the numbers were predicted and where they are located for reference Provide education and explanation of THRR including how the numbers were  predicted and where they are located for reference Provide education and explanation of THRR including how the numbers were predicted and where  they are located for reference     Expected Outcomes Short Term: Able to state/look up THRR;Short Term: Able to use daily as guideline for intensity in rehab;Long Term: Able to use THRR to govern intensity when exercising independently Short Term: Able to state/look up THRR;Short Term: Able to use daily as guideline for intensity in rehab;Long Term: Able to use THRR to govern intensity when exercising independently Short Term: Able to state/look up THRR;Short Term: Able to use daily as guideline for intensity in rehab;Long Term: Able to use THRR to govern intensity when exercising independently     Understanding of Exercise Prescription Yes Yes Yes     Intervention Provide education, explanation, and written materials on patient's individual exercise prescription Provide education, explanation, and written materials on patient's individual exercise prescription Provide education, explanation, and written materials on patient's individual exercise prescription     Expected Outcomes Short Term: Able to explain program exercise prescription;Long Term: Able to explain home exercise prescription to exercise independently Short Term: Able to explain program exercise prescription;Long Term: Able to explain home exercise prescription to exercise independently Short Term: Able to explain program exercise prescription;Long Term: Able to explain home exercise prescription to exercise independently              Exercise Goals Re-Evaluation :  Exercise Goals Re-Evaluation     Row Name 10/19/20 0931 11/13/20 0703           Exercise Goal Re-Evaluation   Exercise Goals Review Increase Physical Activity;Increase Strength and Stamina;Able to understand and use rate of perceived exertion (RPE) scale;Able to understand and use Dyspnea scale;Knowledge and understanding of  Target Heart Rate Range (THRR);Understanding of Exercise Prescription Increase Physical Activity;Increase Strength and Stamina;Able to understand and use rate of perceived exertion (RPE) scale;Able to understand and use Dyspnea scale;Knowledge and understanding of Target Heart Rate Range (THRR);Understanding of Exercise Prescription      Comments Aleyza has completed 2 exercise sessions. She is motivated to exercise and tolerates it well. Shyne has averaged 34 rpm at level 1 on the arm ergometer and 2.1 METs at level 1 on the Nustep. She performs the warmup and cooldown standing up. Will continue to monitor and progress as able. Cherie has completed 7 exercise sessions. She tolerates well and is willing to progress. Jeyli has averaged 32 rpm at level 3 on the arm ergometer and 2.2 METs at level 3 on the Nustep. She performs the warmup and cooldown standing up. Will continue to monitor and progress as able.      Expected Outcomes Through exercise at rehab and home, [place] will decrease SOB with daily activities and feel confident in carrying out an exercise regimen at home. Through exercise at rehab and home, Gissella will decrease shortness of breath with daily activities and feel confident in carrying out an exercise regimen at home.               Discharge Exercise Prescription (Final Exercise Prescription Changes):  Exercise Prescription Changes - 11/22/20 1207       Response to Exercise   Blood Pressure (Admit) 134/76    Blood Pressure (Exit) 136/80    Heart Rate (Admit) 85 bpm    Heart Rate (Exercise) 101 bpm    Heart Rate (Exit) 80 bpm    Oxygen Saturation (Admit) 98 %    Oxygen Saturation (Exercise) 96 %    Oxygen Saturation (Exit) 99 %    Rating of Perceived Exertion (Exercise) 13    Perceived  Dyspnea (Exercise) 1    Duration Continue with 30 min of aerobic exercise without signs/symptoms of physical distress.    Intensity THRR unchanged      Progression    Progression Continue to progress workloads to maintain intensity without signs/symptoms of physical distress.      Resistance Training   Training Prescription Yes    Weight Red bands    Reps 10-15    Time 10 Minutes      NuStep   Level 4    SPM 80    Minutes 15    METs 1.9      Arm Ergometer   Level 4.2    Watts 38    Minutes 15             Nutrition:  Target Goals: Understanding of nutrition guidelines, daily intake of sodium <1516m, cholesterol <2035m calories 30% from fat and 7% or less from saturated fats, daily to have 5 or more servings of fruits and vegetables.  Biometrics:  Pre Biometrics - 10/08/20 1017       Pre Biometrics   Height _0  (1.575 m)    Weight 80 kg    BMI (Calculated) 32.25    Grip Strength 16.5 kg              Nutrition Therapy Plan and Nutrition Goals:  Nutrition Therapy & Goals - 10/23/20 1206       Nutrition Therapy   Diet TLC    Drug/Food Interactions Statins/Certain Fruits      Personal Nutrition Goals   Nutrition Goal Pt to identify and limit food sources of saturated fat, trans fat, refined carbohydrates and sodium    Personal Goal #2 Pt to build a healthy plate including vegetables, fruits, whole grains, and low-fat dairy products in a heart healthy meal plan.    Personal Goal #3 Pt to read labels to choose lower sodium foods      Intervention Plan   Intervention Prescribe, educate and counsel regarding individualized specific dietary modifications aiming towards targeted core components such as weight, hypertension, lipid management, diabetes, heart failure and other comorbidities.;Nutrition handout(s) given to patient.    Expected Outcomes Short Term Goal: A plan has been developed with personal nutrition goals set during dietitian appointment.;Long Term Goal: Adherence to prescribed nutrition plan.             Nutrition Assessments:  MEDIFICTS Score Key: ?70 Need to make dietary changes  40-70 Heart Healthy  Diet ? 40 Therapeutic Level Cholesterol Diet   Picture Your Plate Scores: <4<78nhealthy dietary pattern with much room for improvement. 41-50 Dietary pattern unlikely to meet recommendations for good health and room for improvement. 51-60 More healthful dietary pattern, with some room for improvement.  >60 Healthy dietary pattern, although there may be some specific behaviors that could be improved.    Nutrition Goals Re-Evaluation:  Nutrition Goals Re-Evaluation     [place] 10/23/20 1207             Goals   Current Weight 175 lb (79.4 kg)       Nutrition Goal Pt to identify and limit food sources of saturated fat, trans fat, refined carbohydrates and sodium               Personal Goal #2 Re-Evaluation   Personal Goal #2 Pt to build a healthy plate including vegetables, fruits, whole grains, and low-fat dairy products in a heart healthy meal plan.  Personal Goal #3 Re-Evaluation   Personal Goal #3 Pt to read labels to choose lower sodium foods                Nutrition Goals Discharge (Final Nutrition Goals Re-Evaluation):  Nutrition Goals Re-Evaluation - 10/23/20 1207       Goals   Current Weight 175 lb (79.4 kg)    Nutrition Goal Pt to identify and limit food sources of saturated fat, trans fat, refined carbohydrates and sodium      Personal Goal #2 Re-Evaluation   Personal Goal #2 Pt to build a healthy plate including vegetables, fruits, whole grains, and low-fat dairy products in a heart healthy meal plan.      Personal Goal #3 Re-Evaluation   Personal Goal #3 Pt to read labels to choose lower sodium foods             Psychosocial: Target Goals: Acknowledge presence or absence of significant depression and/or stress, maximize coping skills, provide positive support system. Participant is able to verbalize types and ability to use techniques and skills needed for reducing stress and depression.  Initial Review & Psychosocial Screening:   Initial Psych Review & Screening - 10/08/20 1019       Initial Review   Current issues with Current Depression;Current Stress Concerns    Source of Stress Concerns Family;Chronic Illness;Unable to participate in former interests or hobbies;Unable to perform yard/household activities    Comments Does not feel she needs therapy or medication for her depression.  She lost several family members and feels her depression has improved.      Family Dynamics   Good Support System? Yes      Barriers   Psychosocial barriers to participate in program The patient should benefit from training in stress management and relaxation.      Screening Interventions   Interventions Encouraged to exercise    Expected Outcomes Long Term Goal: Stressors or current issues are controlled or eliminated.             Quality of Life Scores:  Scores of 19 and below usually indicate a poorer quality of life in these areas.  A difference of  2-3 points is a clinically meaningful difference.  A difference of 2-3 points in the total score of the Quality of Life Index has been associated with significant improvement in overall quality of life, self-image, physical symptoms, and general health in studies assessing change in quality of life.  PHQ-9: Recent Review Flowsheet Data     Depression screen [facility] 2/9 10/08/2020 03/28/2020 03/23/2019   Decreased Interest 0 0 1   Down, Depressed, Hopeless 1 0 1   PHQ - 2 Score 1 0 2   Altered sleeping 1  - 3   Tired, decreased energy 1 - 1   Change in appetite 0 - 0   Feeling bad or failure about yourself  0 - 0   Trouble concentrating 0 - 1   Moving slowly or fidgety/restless 0 - 0   Suicidal thoughts 0 - 0   PHQ-9 Score - - 7   Difficult doing work/chores - - Somewhat difficult       Interpretation of Total Score  Total Score Depression Severity:  1-4 = Minimal depression, 5-9 = Mild depression, 10-14 = Moderate depression, 15-19 = Moderately severe depression, 20-27  = Severe depression   Psychosocial Evaluation and Intervention:  Psychosocial Evaluation - 10/08/20 1026       Psychosocial Evaluation & Interventions  Interventions Encouraged to exercise with the program and follow exercise prescription;Stress management education;Relaxation education    Comments Is still very involved in church and likes to go to football games, enjoys reading, but does not like being able to do things she use to enjoy due to her chronic illnesses.    Expected Outcomes For Meghin to handle her stress in healthy ways.    Continue Psychosocial Services  Follow up required by staff             Psychosocial Re-Evaluation:  Psychosocial Re-Evaluation     [place] Name 10/22/20 1024 11/12/20 1137           Psychosocial Re-Evaluation   Current issues with Current Depression;Current Stress Concerns History of Depression;Current Stress Concerns      Comments Joane's depression comes from the past loss of family members and the inability to perform activities that use to be easy for her.  Most days she does not let it bother her, but then others times she is very tired and rests more.  Her family does not always understand, she does what she feels like doing.  She has a positive outlook. Hadley has no signs of depression, she is not able to do all the activities and chores she used to do.  She has learned to pace herself with chores and if she does not feel like doing them she puts them off until she feels up to it or asks someone else to help her. She does not feel that she needs therapy or medication for depression.      Expected Outcomes For Vieve to hand her depression and stress in healthy ways.  She does not want to be on antidepressants and talks with her minister if she needs to vent. For Novis to handle her stress in healthy ways.      Interventions Encouraged to attend Pulmonary Rehabilitation for the exercise;Stress management education Encouraged to  attend Pulmonary Rehabilitation for the exercise;Stress management education;Relaxation education      Continue Psychosocial Services  Follow up required by staff No Follow up required             Initial Review   Source of Stress Concerns Chronic Illness;Unable to participate in former interests or hobbies;Unable to perform yard/household activities Chronic Illness;Unable to perform yard/household activities;Unable to participate in former interests or hobbies               Psychosocial Discharge (Final Psychosocial Re-Evaluation):  Psychosocial Re-Evaluation - 11/12/20 1137       Psychosocial Re-Evaluation   Current issues with History of Depression;Current Stress Concerns    Comments Rufina has no signs of depression, she is not able to do all the activities and chores she used to do.  She has learned to pace herself with chores and if she does not feel like doing them she puts them off until she feels up to it or asks someone else to help her. She does not feel that she needs therapy or medication for depression.    Expected Outcomes For Shacoya to handle her stress in healthy ways.    Interventions Encouraged to attend Pulmonary Rehabilitation for the exercise;Stress management education;Relaxation education    Continue Psychosocial Services  No Follow up required      Initial Review   Source of Stress Concerns Chronic Illness;Unable to perform yard/household activities;Unable to participate in former interests or hobbies             Education:  Education Goals: Education classes will be provided on a weekly basis, covering required topics. Participant will state understanding/return demonstration of topics presented.  Learning Barriers/Preferences:  Learning Barriers/Preferences - 10/08/20 1040       Learning Barriers/Preferences   Learning Barriers None    Learning Preferences Skilled Demonstration;Pictoral;Individual Instruction;Group Instruction;Audio;Verbal  Instruction;Video;Written Material             Education Topics: Risk Factor Reduction:  -Group instruction that is supported by a PowerPoint presentation. Instructor discusses the definition of a risk factor, different risk factors for pulmonary disease, and how the heart and lungs work together.   Flowsheet Row PULMONARY REHAB OTHER RESPIRATORY from 11/15/2020 in [place]  Date 10/18/20  Educator Handout       Nutrition for Pulmonary Patient:  -Group instruction provided by PowerPoint slides, verbal discussion, and written materials to support subject matter. The instructor gives an explanation and review of healthy diet recommendations, which includes a discussion on weight management, recommendations for fruit and vegetable consumption, as well as protein, fluid, caffeine, fiber, sodium, sugar, and alcohol. Tips for eating when patients are short of breath are discussed.   Pursed Lip Breathing:  -Group instruction that is supported by demonstration and informational handouts. Instructor discusses the benefits of pursed lip and diaphragmatic breathing and detailed demonstration on how to preform both.   Flowsheet Row PULMONARY REHAB OTHER RESPIRATORY from 11/15/2020 in [place]  Date 11/01/20  Educator Alease Frame also diaphragmatic]       Oxygen Safety:  -Group instruction provided by PowerPoint, verbal discussion, and written material to support subject matter. There is an overview of "What is Oxygen" and "Why do we need it".  Instructor also reviews how to create a safe environment for oxygen use, the importance of using oxygen as prescribed, and the risks of noncompliance. There is a brief discussion on traveling with oxygen and resources the patient may utilize.   Oxygen Equipment:  -Group instruction provided by [facility] Staff utilizing handouts, written materials, and equipment  demonstrations.   Signs and Symptoms:  -Group instruction provided by written material and verbal discussion to support subject matter. Warning signs and symptoms of infection, stroke, and heart attack are reviewed and when to call the physician/911 reinforced. Tips for preventing the spread of infection discussed.   Advanced Directives:  -Group instruction provided by verbal instruction and written material to support subject matter. Instructor reviews Advanced Directive laws and proper instruction for filling out document.   Pulmonary Video:  -Group video education that reviews the importance of medication and oxygen compliance, exercise, good nutrition, pulmonary hygiene, and pursed lip and diaphragmatic breathing for the pulmonary patient.   Exercise for the Pulmonary Patient:  -Group instruction that is supported by a PowerPoint presentation. Instructor discusses benefits of exercise, core components of exercise, frequency, duration, and intensity of an exercise routine, importance of utilizing pulse oximetry during exercise, safety while exercising, and options of places to exercise outside of rehab.   Flowsheet Row PULMONARY REHAB OTHER RESPIRATORY from 11/15/2020 in [place]  Date 11/15/20  Educator handout       Pulmonary Medications:  -Verbally interactive group education provided by instructor with focus on inhaled medications and proper administration.   Anatomy and Physiology of the Respiratory System and Intimacy:  -Group instruction provided by PowerPoint, verbal discussion, and written material to support subject matter. Instructor reviews respiratory cycle and anatomical components of  the respiratory system and their functions. Instructor also reviews differences in obstructive and restrictive respiratory diseases with examples of each. Intimacy, Sex, and Sexuality differences are reviewed with a discussion on how relationships can change  when diagnosed with pulmonary disease. Common sexual concerns are reviewed.   MD DAY -A group question and answer session with a medical doctor that allows participants to ask questions that relate to their pulmonary disease state.   OTHER EDUCATION -Group or individual verbal, written, or video instructions that support the educational goals of the pulmonary rehab program. [place] from 11/15/2020 in [place]  Date 11/08/20  Educator Deanne Coffer your numbers]       Holiday Eating Survival Tips:  -Group instruction provided by PowerPoint slides, verbal discussion, and written materials to support subject matter. The instructor gives patients tips, tricks, and techniques to help them not only survive but enjoy the holidays despite the onslaught of food that accompanies the holidays.   Knowledge Questionnaire Score:  Knowledge Questionnaire Score - 10/08/20 1143       Knowledge Questionnaire Score   Pre Score 15/18             Core Components/Risk Factors/Patient Goals at Admission:  Personal Goals and Risk Factors at Admission - 10/08/20 1145       Core Components/Risk Factors/Patient Goals on Admission   Improve shortness of breath with ADL's Yes    Intervention Provide education, individualized exercise plan and daily activity instruction to help decrease symptoms of SOB with activities of daily living.    Expected Outcomes Short Term: Improve cardiorespiratory fitness to achieve a reduction of symptoms when performing ADLs;Long Term: Be able to perform more ADLs without symptoms or delay the onset of symptoms             Core Components/Risk Factors/Patient Goals Review:   Goals and Risk Factor Review     Row Name 11/12/20 1142             Core Components/Risk Factors/Patient Goals Review   Personal Goals Review Increase knowledge of respiratory medications and ability to use  respiratory devices properly.;Improve shortness of breath with ADL's;Develop more efficient breathing techniques such as purse lipped breathing and diaphragmatic breathing and practicing self-pacing with activity.       Review "Jenita Seashore" has sarcoidosis fo the lung and has increased her exercise slowly over the last 4 weeks she has participated in pulmonary rehab. Some days are better than others, she works less hard on those days.       Expected Outcomes See admission goals.                Core Components/Risk Factors/Patient Goals at Discharge (Final Review):   Goals and Risk Factor Review - 11/12/20 1142       Core Components/Risk Factors/Patient Goals Review   Personal Goals Review Increase knowledge of respiratory medications and ability to use respiratory devices properly.;Improve shortness of breath with ADL's;Develop more efficient breathing techniques such as purse lipped breathing and diaphragmatic breathing and practicing self-pacing with activity.    Review "Jenita Seashore" has sarcoidosis fo the lung and has increased her exercise slowly over the last 4 weeks she has participated in pulmonary rehab. Some days are better than others, she works less hard on those days.    Expected Outcomes See admission goals.             ITP Comments:   Comments: ITP REVIEW  Pt is making expected progress toward Pulmonary Rehab goals after completing 11 sessions. Recommend continued exercise, life style modification, education, and utilization of breathing techniques to increase stamina and strength, while also decreasing shortness of breath with exertion.

## 2020-11-28 ENCOUNTER — Ambulatory Visit (HOSPITAL_COMMUNITY)
Admission: RE | Admit: 2020-11-28 | Payer: Medicare Other | Source: Ambulatory Visit | Attending: Nurse Practitioner | Admitting: Nurse Practitioner

## 2020-11-29 ENCOUNTER — Other Ambulatory Visit: Payer: Self-pay

## 2020-11-29 ENCOUNTER — Encounter (HOSPITAL_COMMUNITY)
Admission: RE | Admit: 2020-11-29 | Discharge: 2020-11-29 | Disposition: A | Discharge status: Home / Self Care | Payer: Medicare Other | Source: Ambulatory Visit | Attending: Internal Medicine | Admitting: Internal Medicine

## 2020-11-29 DIAGNOSIS — D860 Sarcoidosis of lung: Secondary | ICD-10-CM

## 2020-11-29 NOTE — Progress Notes (Signed)
Daily Session Note  Patient Details  Name: Sheila Reynolds MRN: [id number] Date of Birth: 30-Jan-1946 Referring Provider:   April Manson Pulmonary Rehab Walk Test from 10/08/2020 in [place]  Referring Provider Dr. Annamaria Boots       Encounter Date: 11/29/2020  Check In:  Session Check In - 11/29/20 1136       Check-In   Supervising physician immediately available to respond to emergencies [facility] immediately available    Physician(s) Dr. Nevada Crane    Location [facility]    Staff Present Rosebud Poles, RN, BSN;Lisa Ysidro Evert, Cathleen Fears, MS, ACSM-CEP, Exercise Physiologist    Virtual Visit No    Medication changes reported     No    Fall or balance concerns reported    No    Tobacco Cessation No Change    Warm-up and Cool-down Performed as group-led instruction    Resistance Training Performed No    VAD Patient? No    PAD/SET Patient? No      Pain Assessment   Currently in Pain? No/denies    Multiple Pain Sites No             Capillary Blood Glucose: No results found for this or any previous visit (from the past 24 hour(s)).    Social History   Tobacco Use  Smoking Status Former   Packs/day: 1.00   Years: 4.00   Pack years: 4.00   Types: Cigarettes   Quit date: 02/11/1980   Years since quitting: 40.8  Smokeless Tobacco Never    Goals Met:  Proper associated with RPD/PD & O2 Sat Exercise tolerated well No report of concerns or symptoms today Strength training completed today  Goals Unmet:  Not Applicable  Comments: Service time is from 1020 to 1130.    Dr. Rodman Pickle is the pulmonary rehab medical director @ [facility].

## 2020-12-02 NOTE — Progress Notes (Signed)
HPI  F former smoker followed for bronchitis, hx  Occular sarcoid, bronchitis/ nodules, OSA complicated by GERD, glaucoma, DM2 NPSG 12/25/03- AHI  2.9/ hr, desaturation to 91%, body weight 164 lbs NPSG-10/18/13- Mild OSA, AHI 9.3/ hr, weight 164 lbs  ACE 09/17/15-47-WNL ACE level 08/18/16-30 ACE level 05/14/20- 22 PFT: 01/07/2011-FEV1 1.59/79%, FEV1/FVC 0.73, FEF 25-75% improved to 38% with bronchodilator. TLC 90% DLCO 55% Office Spirometry 06/09/16- moderate restriction of exhaled volume. FVC 1.51/68%, FEV1 1.20/70%, ratio 0.79, FEF 25-75% 1.19/73% Nuclear Stress Test 09/04/16- EF 65%, low risk. ECHO 07/31/16- Gr 1 DD Walk test on room air 12/21/2017-minimal saturation 94%, maximum heart rate 118/minute with no stops walking 3 laps x180 m. Office Spirometry 12/21/17 PFT 04/13/20- Minimal Obstruction and Minimal Restriction, no resp to BD, Moderately severe DLCO defect WALK TEST 4/422- 2 laps, min O2 sat 94%, max HR 127.  ---------------------------------------------------------------------------------------------------------------  CT chest- 04/30/20- IMPRESSION: 1. 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 Repeat high-resolution chest CT is recommended in 12 months to assess for temporal changes in the appearance of the lung parenchyma. 2. In addition, there is what appears to be chronic and worsening mucoid impaction in the right upper lobe, potentially related to a chronic indolent atypical infectious process such as mycobacterial infection. Attention at time of repeat high-resolution chest CT is recommended. 3. There is also mild centrilobular and paraseptal emphysema. 4. Aortic atherosclerosis, in addition to left main and 3 vessel coronary artery disease. Assessment for potential risk factor modification, dietary therapy or pharmacologic therapy  may be warranted, if clinically indicated. 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 States she had covid back in April 2022. She is still struggling with SOB especially with exertion. Still using her cpap machine each night.  Treated with infusion for Covid in April "worse than flu". Anoro samples really helped, but didn't seek script when they ran out. Unclear if increased DOE now relates to Covid residual or lack of Anoro. Some dry cough. No adenopathy or fever. Chronic thoracic pain is less bothersome now. Uses CPAP every night. Old machine.  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  Need repeat CT now for ILD progression Download- Body weight today-176 lbs Covid vax-3 Phizer Flu vax-had -----Patient feels like breathing is getting worse with exertion. Has spot on her left leg that hurts.  Lab- 10/5 D-dimer 0.96,  Still pending replacement changing CPAP from 7 to auto 5-15 from [facility]. Increased  dyspnea on exertion with no abrupt event.  Occasionally feels some chest tightness.  Cough productive of clear or green/yellow with no blood.  ROS-see HPI     + = positive Constitutional:   No-   weight loss, night sweats, fevers, chills, fatigue, lassitude. HEENT:   +  headaches, difficulty swallowing, tooth/dental problems, sore throat,       Some  sneezing, itching, ear ache, +nasal congestion, post nasal drip,  CV: + chest pain, no-orthopnea, PND, +swelling in lower extremities, anasarca, dizziness, palpitations Resp: +  shortness of breath with exertion not at rest.            productive cough,  + non-productive cough,  No- coughing up of blood.               in color of mucus.   wheezing.   Skin: No-   rash or lesions. GI:  +  heartburn, indigestion, abdominal pain, nausea, vomiting,  GU:  MS:  +  joint pain or swelling, + back pain Neuro-     nothing unusual Psych:  No- change in mood or affect. No depression or anxiety.  No memory loss.  OBJ  Afebrile General- Alert, Oriented, Affect tearful, Distress- none acute,  Skin- + hyperpigmented 2 cm diameter area lateral left calf may be a little tender, not indurated, no obvious trauma, nonspecific.  Homan's-neg Lymphadenopathy- none Head- atraumatic            Eyes- Gross vision intact, PERRLA, conjunctivae clear secretions- not injected            Ears- Hearing aid             Nose-  turbinate edema, no-Septal dev, mucus, polyps, erosion, perforation             Throat- Mallampati III , mucosa  , drainage- none, tonsils- atrophic Neck- flexible , trachea midline, no stridor , thyroid nl, carotid no bruit Chest - symmetrical excursion , unlabored           Heart/CV- RRR , no murmur , no gallop  , no rub, nl s1 s2                           - JVD- none , edema- none, stasis changes- none, varices- none           Lung-  Clear, Cough-none here, wheeze-none, rhonchi-none , dullness-none, rub- none           Chest wall-  Abd- No HSM Br/  Gen/ Rectal- Not done, not indicated Extrem- cyanosis- none, clubbing, none, atrophy- none, strength- nl. +elastic hose Neuro- grossly intact to observation

## 2020-12-03 ENCOUNTER — Ambulatory Visit (INDEPENDENT_AMBULATORY_CARE_PROVIDER_SITE_OTHER): Payer: Medicare Other | Admitting: Internal Medicine

## 2020-12-03 ENCOUNTER — Other Ambulatory Visit: Payer: Self-pay

## 2020-12-03 ENCOUNTER — Encounter: Payer: Self-pay | Admitting: Internal Medicine

## 2020-12-03 VITALS — BP 124/70 | HR 78 | Temp 97.9°F | Ht 62.0 in | Wt 176.2 lb

## 2020-12-03 DIAGNOSIS — G4733 Obstructive sleep apnea (adult) (pediatric): Secondary | ICD-10-CM

## 2020-12-03 DIAGNOSIS — I25119 Atherosclerotic heart disease of native coronary artery with unspecified angina pectoris: Secondary | ICD-10-CM

## 2020-12-03 DIAGNOSIS — D869 Sarcoidosis, unspecified: Secondary | ICD-10-CM

## 2020-12-03 DIAGNOSIS — J849 Interstitial pulmonary disease, unspecified: Secondary | ICD-10-CM | POA: Diagnosis not present

## 2020-12-03 DIAGNOSIS — J441 Chronic obstructive pulmonary disease with (acute) exacerbation: Secondary | ICD-10-CM

## 2020-12-03 DIAGNOSIS — R918 Other nonspecific abnormal finding of lung field: Secondary | ICD-10-CM | POA: Diagnosis not present

## 2020-12-03 DIAGNOSIS — R0989 Other specified symptoms and signs involving the circulatory and respiratory systems: Secondary | ICD-10-CM | POA: Diagnosis not present

## 2020-12-03 DIAGNOSIS — R9389 Abnormal findings on diagnostic imaging of other specified body structures: Secondary | ICD-10-CM | POA: Diagnosis not present

## 2020-12-03 DIAGNOSIS — J449 Chronic obstructive pulmonary disease, unspecified: Secondary | ICD-10-CM

## 2020-12-03 DIAGNOSIS — R600 Localized edema: Secondary | ICD-10-CM | POA: Diagnosis not present

## 2020-12-03 NOTE — Patient Instructions (Addendum)
Order- schedule CTa chest PE protocol with and without contrast    dx suspect PE, ILD, abnormal CT chest  Order- DME [facility]- Please replace old CPAP machine, change to auto 5-15, mask of choice, humidifier, supplies, Airview/ card  When you sit, try to elevate your legs to reduce fluid pressure in your calves.  Follow-up with your vein and vascular doctor as planned  Please call as needed

## 2020-12-04 ENCOUNTER — Encounter (HOSPITAL_COMMUNITY)
Admission: RE | Admit: 2020-12-04 | Discharge: 2020-12-04 | Disposition: A | Discharge status: Home / Self Care | Payer: Medicare Other | Source: Ambulatory Visit | Attending: Internal Medicine | Admitting: Internal Medicine

## 2020-12-04 DIAGNOSIS — D860 Sarcoidosis of lung: Secondary | ICD-10-CM | POA: Diagnosis not present

## 2020-12-04 NOTE — Progress Notes (Addendum)
I have reviewed a Home Exercise Prescription with Sheila Reynolds. Sheila Reynolds is currently exercising at home. Sheila Reynolds is walking and stair climbing 6-7 days/wk for 10-15 min. She also performs warmup and cooldown exercises at home. She used to do water aerobics 5 days/wk for 60+ min/day. I encouraged her to get back into water aerobics at least 1 non-rehab/day week for 60 min/day. She really enjoys the pool, which why she spends so much time at the pool. She does not spend the whole 60+ min doing intense exercise. I mentioned to her it was alright to do less time at aquatic center. Sheila Reynolds and I discussed how to progress their exercise prescription. I encouraged Sheila Reynolds to perform resistance band and balance exercises. She agreed with my recommendations. The patient stated that their goals were to improve balance and walking, while also maintaining current health and fitness.  Sheila Reynolds stated that they understand the exercise prescription.  We reviewed exercise guidelines, target heart rate during exercise, RPE Scale, weather conditions, endpoints for exercise, warmup and cool down.  Patient is encouraged to come to me with any questions. I will continue to follow up with the patient to assist them with progression and safety.    Sheppard Plumber, MS, ACSM-CEP 12/04/2020 11:02 AM

## 2020-12-04 NOTE — Progress Notes (Addendum)
Daily Session Note  Patient Details  Name: Sheila Reynolds MRN: [id number] Date of Birth: 07-06-1945 Referring Provider:   April Manson Pulmonary Rehab Walk Test from 10/08/2020 in [place]  Referring Provider Dr. Annamaria Boots       Encounter Date: 12/04/2020  Check In:  Session Check In - 12/04/20 1105       Check-In   Supervising physician immediately available to respond to emergencies [facility] immediately available    Physician(s) Dr. Karleen Hampshire    Location [facility]    Staff Present Rosebud Poles, RN, Quentin Ore, MS, ACSM-CEP, Exercise Physiologist;Rutherford Alarie Ysidro Evert, RN    Virtual Visit No    Medication changes reported     No    Fall or balance concerns reported    No    Tobacco Cessation No Change    Warm-up and Cool-down Performed as group-led instruction    Resistance Training Performed Yes    VAD Patient? No    PAD/SET Patient? No      Pain Assessment   Currently in Pain? No/denies    Multiple Pain Sites No             Capillary Blood Glucose: No results found for this or any previous visit (from the past 24 hour(s)).   Exercise Prescription Changes - 12/04/20 1100       Home Exercise Plan   Plans to continue exercise at [facility] (comment)   [facility]   Frequency Add 1 additional day to program exercise sessions.   Plans to do water aerobics 5 days/wk after rehab   Initial Home Exercises Provided 12/04/20             Social History   Tobacco Use  Smoking Status Former   Packs/day: 1.00   Years: 4.00   Pack years: 4.00   Types: Cigarettes   Quit date: 02/11/1980   Years since quitting: 40.8  Smokeless Tobacco Never    Goals Met:  Exercise tolerated well No report of concerns or symptoms today Strength training completed today  Goals Unmet:  Not Applicable  Comments: Service time is from 1020 to 1125    Dr. Rodman Pickle is director for Pulmonary Rehab  at [facility].

## 2020-12-05 ENCOUNTER — Other Ambulatory Visit: Payer: Self-pay

## 2020-12-05 ENCOUNTER — Other Ambulatory Visit: Payer: Medicare Other | Admitting: *Deleted

## 2020-12-05 DIAGNOSIS — E7800 Pure hypercholesterolemia, unspecified: Secondary | ICD-10-CM

## 2020-12-05 DIAGNOSIS — I25118 Atherosclerotic heart disease of native coronary artery with other forms of angina pectoris: Secondary | ICD-10-CM

## 2020-12-06 ENCOUNTER — Encounter (HOSPITAL_COMMUNITY): Payer: Medicare Other

## 2020-12-06 ENCOUNTER — Ambulatory Visit (HOSPITAL_COMMUNITY)
Admission: RE | Admit: 2020-12-06 | Discharge: 2020-12-06 | Disposition: A | Discharge status: Home / Self Care | Payer: Medicare Other | Source: Ambulatory Visit | Attending: Internal Medicine | Admitting: Internal Medicine

## 2020-12-06 DIAGNOSIS — R9389 Abnormal findings on diagnostic imaging of other specified body structures: Secondary | ICD-10-CM | POA: Insufficient documentation

## 2020-12-06 DIAGNOSIS — R0989 Other specified symptoms and signs involving the circulatory and respiratory systems: Secondary | ICD-10-CM | POA: Diagnosis not present

## 2020-12-06 DIAGNOSIS — J849 Interstitial pulmonary disease, unspecified: Secondary | ICD-10-CM | POA: Diagnosis not present

## 2020-12-06 DIAGNOSIS — R911 Solitary pulmonary nodule: Secondary | ICD-10-CM | POA: Diagnosis not present

## 2020-12-06 DIAGNOSIS — R079 Chest pain, unspecified: Secondary | ICD-10-CM | POA: Diagnosis not present

## 2020-12-06 LAB — LIPID PANEL
Chol/HDL Ratio: 3.9 ratio (ref 0.0–4.4)
Cholesterol, Total: 177 mg/dL (ref 100–199)
HDL: 45 mg/dL (ref >39)
LDL Chol Calc (NIH): 111 mg/dL — ABNORMAL HIGH (ref 0–99)
Triglycerides: 118 mg/dL (ref 0–149)
VLDL Cholesterol Cal: 21 mg/dL (ref 5–40)

## 2020-12-06 LAB — ALT: ALT: 17 IU/L (ref 0–32)

## 2020-12-06 MED ORDER — IOHEXOL 350 MG/ML SOLN
80.0000 mL | Freq: Once | INTRAVENOUS | Status: AC | PRN
  Administered 2020-12-06: 80 mL via INTRAVENOUS

## 2020-12-07 NOTE — Telephone Encounter (Signed)
Created in error

## 2020-12-10 ENCOUNTER — Other Ambulatory Visit: Payer: Self-pay

## 2020-12-10 DIAGNOSIS — I5032 Chronic diastolic (congestive) heart failure: Secondary | ICD-10-CM

## 2020-12-10 DIAGNOSIS — E1165 Type 2 diabetes mellitus with hyperglycemia: Secondary | ICD-10-CM

## 2020-12-10 DIAGNOSIS — I110 Hypertensive heart disease with heart failure: Secondary | ICD-10-CM

## 2020-12-10 DIAGNOSIS — H401122 Primary open-angle glaucoma, left eye, moderate stage: Secondary | ICD-10-CM | POA: Diagnosis not present

## 2020-12-10 DIAGNOSIS — E7800 Pure hypercholesterolemia, unspecified: Secondary | ICD-10-CM | POA: Diagnosis not present

## 2020-12-10 DIAGNOSIS — R918 Other nonspecific abnormal finding of lung field: Secondary | ICD-10-CM

## 2020-12-10 DIAGNOSIS — H401113 Primary open-angle glaucoma, right eye, severe stage: Secondary | ICD-10-CM | POA: Diagnosis not present

## 2020-12-10 NOTE — Telephone Encounter (Signed)
-----   Message from Deneise Lever, MD sent at 12/10/2020  1:55 PM EDT ----- I understand Sheila Reynolds feeling uncomfortable with the 3 month wait for repeat CT suggested by Radiology. We will get a PET can to see how active this nodule is.  Order- please go ahead and  schedule PET CT neck to thigh  dx lung nodule on CT ----- Message ----- From: Darliss Ridgel, CMA Sent: 12/10/2020  10:29 AM EDT To: Deneise Lever, MD  Spoke with pt to go over CT results, pt has questions about why the HRCT is not being completed sooner than January. Pt would like to know what the lung nodule is sooner than Jan.  Please advise

## 2020-12-10 NOTE — Telephone Encounter (Signed)
Created in error

## 2020-12-11 ENCOUNTER — Ambulatory Visit (HOSPITAL_COMMUNITY)
Admission: RE | Admit: 2020-12-11 | Discharge: 2020-12-11 | Disposition: A | Discharge status: Home / Self Care | Payer: Medicare Other | Source: Ambulatory Visit | Attending: Cardiovascular Disease | Admitting: Cardiovascular Disease

## 2020-12-11 ENCOUNTER — Other Ambulatory Visit: Payer: Self-pay

## 2020-12-11 ENCOUNTER — Telehealth: Payer: Self-pay

## 2020-12-11 ENCOUNTER — Encounter (HOSPITAL_COMMUNITY)
Admission: RE | Admit: 2020-12-11 | Discharge: 2020-12-11 | Disposition: A | Discharge status: Home / Self Care | Payer: Medicare Other | Source: Ambulatory Visit | Attending: Internal Medicine | Admitting: Internal Medicine

## 2020-12-11 VITALS — Wt 175.3 lb

## 2020-12-11 DIAGNOSIS — M79605 Pain in left leg: Secondary | ICD-10-CM

## 2020-12-11 DIAGNOSIS — D860 Sarcoidosis of lung: Secondary | ICD-10-CM | POA: Insufficient documentation

## 2020-12-11 NOTE — Progress Notes (Signed)
Daily Session Note  Patient Details  Name: Sheila Reynolds MRN: [id number] Date of Birth: 1945-10-26 Referring Provider:   April Manson Pulmonary Rehab Walk Test from 10/08/2020 in [place]  Referring Provider Dr. Annamaria Boots       Encounter Date: 12/11/2020  Check In:  Session Check In - 12/11/20 1049       Check-In   Supervising physician immediately available to respond to emergencies [facility] immediately available    Physician(s) Dr. Dwyane Dee    Location [facility]    Staff Present Rosebud Poles, RN, Quentin Ore, MS, ACSM-CEP, Exercise Physiologist;Shanyiah Conde Ysidro Evert, RN    Virtual Visit No    Medication changes reported     No    Fall or balance concerns reported    No    Tobacco Cessation No Change    Warm-up and Cool-down Performed as group-led instruction    Resistance Training Performed Yes    VAD Patient? No    PAD/SET Patient? No      Pain Assessment   Currently in Pain? No/denies    Multiple Pain Sites No             Capillary Blood Glucose: No results found for this or any previous visit (from the past 24 hour(s)).   Exercise Prescription Changes - 12/11/20 1100       Response to Exercise   Blood Pressure (Admit) 138/90    Blood Pressure (Exercise) 150/64    Blood Pressure (Exit) 134/90    Heart Rate (Admit) 84 bpm    Heart Rate (Exercise) 117 bpm    Heart Rate (Exit) 98 bpm    Oxygen Saturation (Admit) 96 %    Oxygen Saturation (Exercise) 95 %    Oxygen Saturation (Exit) 98 %    Rating of Perceived Exertion (Exercise) 14    Perceived Dyspnea (Exercise) 2    Duration Continue with 30 min of aerobic exercise without signs/symptoms of physical distress.    Intensity THRR unchanged      Progression   Progression Continue to progress workloads to maintain intensity without signs/symptoms of physical distress.      Resistance Training   Training Prescription Yes    Weight Red bands    Reps  10-15    Time 10 Minutes      NuStep   Level 5    SPM 80    Minutes 15    METs 2.5      Arm Ergometer   Level 4.5    Watts 35    Minutes 15             Social History   Tobacco Use  Smoking Status Former   Packs/day: 1.00   Years: 4.00   Pack years: 4.00   Types: Cigarettes   Quit date: 02/11/1980   Years since quitting: 40.8  Smokeless Tobacco Never    Goals Met:  Exercise tolerated well No report of concerns or symptoms today Strength training completed today  Goals Unmet:  Not Applicable  Comments: Service time is from 1019 to 1130    Dr. Rodman Pickle is Medical Director for Pulmonary Rehab at [facility].

## 2020-12-11 NOTE — Chronic Care Management (AMB) (Signed)
Chronic Care Management Pharmacy Assistant   Name: Sheila Reynolds  MRN: [id number] DOB: 17-Aug-1945   Reason for Encounter: Chart review for CPP visit on 12-12-2020   Conditions to be addressed/monitored: CHF, CAD, HTN, HLD, DMII, and GERD  Recent office visits:  11-14-2020 Bary Castilla, NP. Glucose= 148, Albumin/Globulin= 2.3. D-Dimer= 0.96. Orders placed for Vas [place] lower extremity Venous.  11-12-2020 Little, [name], RN (CCM).  11-08-2020 Glendale Chard, MD. Shingrix ordered. Sodium= 145. A1C= 6.3.  09-12-2020 Ghumman, Ramandeep, NP. STOP sodium chloride. START Epinephrine 0.3 mg as needed.  08-07-2020 Little, [name], RN (CCM)  08-01-2020 Bary Castilla, NP. Start macrobid 100 mg twice daily for 7 weeks. Referral to urology placed. A1C= 6.1. Abnormal UA.  06-26-2020 Roman [place], [place], [place]. B12 injection given.  06-19-2020 Azalee Course T, CMA. B12 injection given.  Recent consult visits:  12-04-2020 Deneise Lever, MD (Pulmonary rehab). Follow up.  12-03-2020 Deneise Lever, MD (Pulmonary). Orders placed for CT angio chest w/Cm &/ or Wo Cm.  11-29-2020 Deneise Lever, MD (Pulmonary rehab). Follow up.  11-22-2020 Deneise Lever, MD (Pulmonary rehab). Follow up.  11-20-2020 Deneise Lever, MD (Pulmonary rehab). Follow up.  11-15-2020 Garvin Fila, MD (Neurology). STOP Atorvastatin. DECREASE Topiramate 25 mg twice daily TO 25 mg nightly. Follow up in 3 months.  11-15-2020 Deneise Lever, MD (Pulmonary rehab). Follow up.  11-13-2020 Deneise Lever, MD (Pulmonary rehab). Follow up.  11-08-2020 Deneise Lever, MD (Pulmonary rehab). Follow up.  11-06-2020 Deneise Lever, MD (Pulmonary rehab). Follow up.  11-01-2020 Deneise Lever, MD (Pulmonary rehab). Follow up.  10-30-2020 Deneise Lever, MD (Pulmonary rehab). Follow up.  10-23-2020 Deneise Lever, MD (Pulmonary rehab). Follow up.  10-18-2020 Deneise Lever,  MD (Pulmonary rehab). Follow up.  10-16-2020 Deneise Lever, MD (Pulmonary rehab). Follow up.  10-08-2020 Deneise Lever, MD (Pulmonary rehab). Rehab walk test.  08-23-2020 [name], Wonda Cheng, MD (Cardiology). START zetia 10 mg daily. EKG ordered. LDL= 111. Follow up in 1 year.  08-15-2020 Deneise Lever, MD (Pulmonary). Referral placed for DME.  08-06-2020 Moya, Carlisle Beers, MD (Ophthalmology). Follow up.  07-24-2020 Leeroy Cha, PT (Rehab). Physical therapy.  07-19-2020 Leeroy Cha, PT (Rehab). Physical therapy.  07-18-2020 Susy Frizzle, PTA (Rehab). Physical therapy.  07-16-2020 Roseanne Reno B, PTA (Rehab). Physical therapy.  07-13-2020 Susy Frizzle, PTA (Rehab). Physical therapy.  07-11-2020 Leeroy Cha, PT (Rehab). Physical therapy.  07-06-2020 Susy Frizzle, PTA (Rehab). Physical therapy.  07-04-2020 Roseanne Reno B, PTA (Rehab). Physical therapy.  07-02-2020 Roseanne Reno B, PTA (Rehab). Physical therapy.  06-18-2020 Leeroy Cha, PT (Rehab). Physical therapy.  Hospital visits:  Medication Reconciliation was completed by comparing discharge summary, patient's EMR and Pharmacy list, and upon discussion with patient.  Admitted to the hospital on 09-03-2020 due to urticaria. Discharge date was 09-03-2020. Discharged from [place]?Medications Started at [facility] Discharge:?? Prednisone 40 mg twice daily for 4 days  Medication Changes at Hospital Discharge: None  Medications Discontinued at Hospital Discharge: None  Medications that remain the same after Hospital Discharge:??  -All other medications will remain the same.    Medications: Outpatient Encounter Medications as of 12/11/2020  Medication Sig   acetaminophen (TYLENOL) 500 MG tablet Take 1,000 mg by mouth 2 (two) times daily as needed for moderate pain or headache.   albuterol (PROVENTIL) (2.5 MG/3ML) 0.083% nebulizer solution  Take  3 mLs (2.5 mg total) by nebulization daily as needed for wheezing or shortness of breath.   albuterol (VENTOLIN HFA) 108 (90 Base) MCG/ACT inhaler Inhale 2 puffs into the lungs every 6 (six) hours as needed for wheezing or shortness of breath.   aspirin EC 81 MG tablet Take 81 mg by mouth daily. Swallow whole.   Blood Glucose Monitoring Suppl (ACCU-CHEK AVIVA PLUS) w/Device KIT Use to check blood sugars 3 times a day. Dx code e11.65   brimonidine (ALPHAGAN P) 0.1 % SOLN Place 1 drop into both eyes 2 (two) times daily.    brinzolamide (AZOPT) 1 % ophthalmic suspension Place 1 drop into both eyes 3 (three) times daily.   carvedilol (COREG) 12.5 MG tablet TAKE 1 TABLET(12.5 MG) BY MOUTH TWICE DAILY   clopidogrel (PLAVIX) 75 MG tablet Take 1 tablet (75 mg total) by mouth daily.   EPINEPHrine 0.3 mg/0.3 mL IJ SOAJ injection Inject 0.3 mg into the muscle as needed for anaphylaxis.   ezetimibe (ZETIA) 10 MG tablet Take 1 tablet (10 mg total) by mouth daily.   furosemide (LASIX) 40 MG tablet Take 1 tablet (40 mg total) by mouth every other day.   isosorbide mononitrate (IMDUR) 60 MG 24 hr tablet TAKE 1 TABLET(60 MG) BY MOUTH DAILY   ketorolac (ACULAR) 0.5 % ophthalmic solution Place 1 drop into the right eye 3 (three) times daily.   Lancets (ONETOUCH DELICA PLUS [id number]) MISC CHECK BLOOD SUGAR BEFORE BREAKFAST AND DINNER   montelukast (SINGULAIR) 10 MG tablet TAKE 1 TABLET(10 MG) BY MOUTH EVERY EVENING   Nebulizers (COMPRESSOR/NEBULIZER) MISC Use as directed   nitroGLYCERIN (NITROSTAT) 0.4 MG SL tablet Place 1 tablet (0.4 mg total) under the tongue every 5 (five) minutes as needed for chest pain. (Patient not taking: Reported on 11/08/2020)   pantoprazole (PROTONIX) 40 MG tablet Take 40 mg by mouth daily.   Polyvinyl Alcohol-Povidone PF (REFRESH) 1.4-0.6 % SOLN Place 1-2 drops into both eyes 3 (three) times daily as needed (dry/irritated eyes.).   potassium chloride SA (KLOR-CON) 20 MEQ tablet Take  1 tablet (20 mEq total) by mouth every other day.   prednisoLONE acetate (PRED FORTE) 1 % ophthalmic suspension Place 1 drop into both eyes 4 (four) times daily.    pregabalin (LYRICA) 75 MG capsule TAKE 1 CAPSULE(75 MG) BY MOUTH TWICE DAILY   sitaGLIPtin-metformin (JANUMET) 50-500 MG tablet Take 1 tablet by mouth 2 (two) times daily with a meal.   topiramate (TOPAMAX) 25 MG tablet Take 1 tablet (25 mg total) by mouth at bedtime.   Travoprost, BAK Free, (TRAVATAN) 0.004 % SOLN ophthalmic solution Place 1 drop into the right eye at bedtime.    umeclidinium-vilanterol (ANORO ELLIPTA) 62.5-25 MCG/INH AEPB Inhale 1 puff into the lungs daily.   No facility-administered encounter medications on file as of 12/11/2020.    12-11-2020: 1st attempt left VM  Care Gaps: Shingrix overdue PNA Vac overdue last completed 12-21-2017 Covid booster overdue last completed 11-22-2019 AWV 04-04-2021  [facility] Rating Drugs: Janumet 50-500 mg- Last filled 09-12-2020 90 DS [place] Clinical Pharmacist Assistant [phone]

## 2020-12-12 ENCOUNTER — Telehealth: Payer: Medicare Other

## 2020-12-13 ENCOUNTER — Ambulatory Visit (INDEPENDENT_AMBULATORY_CARE_PROVIDER_SITE_OTHER): Payer: Medicare Other | Admitting: Pharmacist

## 2020-12-13 ENCOUNTER — Encounter (HOSPITAL_COMMUNITY)
Admission: RE | Admit: 2020-12-13 | Discharge: 2020-12-13 | Disposition: A | Discharge status: Home / Self Care | Payer: Medicare Other | Source: Ambulatory Visit | Attending: Internal Medicine | Admitting: Internal Medicine

## 2020-12-13 ENCOUNTER — Other Ambulatory Visit: Payer: Self-pay

## 2020-12-13 DIAGNOSIS — I25119 Atherosclerotic heart disease of native coronary artery with unspecified angina pectoris: Secondary | ICD-10-CM

## 2020-12-13 DIAGNOSIS — E7800 Pure hypercholesterolemia, unspecified: Secondary | ICD-10-CM

## 2020-12-13 DIAGNOSIS — D860 Sarcoidosis of lung: Secondary | ICD-10-CM | POA: Diagnosis not present

## 2020-12-13 MED ORDER — ATORVASTATIN CALCIUM 80 MG PO TABS: 80.0000 mg | ORAL_TABLET | Freq: Every day | ORAL | 3 refills | Status: DC

## 2020-12-13 NOTE — Progress Notes (Signed)
Patient ID: Sheila Reynolds                 DOB: December 09, 1945                    MRN: [id number]     HPI: Sheila Reynolds is a 75 y.o. female patient referred to lipid clinic by Dr. Acie Fredrickson. PMH is significant for CAD s/p stent in LAD in Dec 2018, non sustained VT, sarcoidosis, CVA x 2, DM and HLD.   Last lipid labs showed an LDL of 111. Patient sent to lipid clinic.   She presents today to lipid clinic. States that she was taken off of atorvastatin because LDL kept going up. I reviewed Dr. Elmarie Shiley note from July and it said to continue atorvastatin and add zetia. Patient had stopped atorvastatin and started zetia. She says she has only been on for about a month because the mail order pharmacy never sent it to her.  Current Medications: ezetimibe 10mg  daily Intolerances: atorvastatin 20mg , atorvastatin 40mg , atorvastatin 80mg  (patient denies issues) Risk Factors: CAD, CVA, DM LDL goal: <55  Diet:  Breakfast: coffee w/ splenda, toast, apple sauce, sometimes sausage, sometime just place oatmeal or cereal, boiled egg Lunch: salads, left overs from dinner, hamburgers, hot dogs, salads w/ chicken, strawberries, blueberries, nuts (chick fil A) Dinner: tuna cassarole and salad, BBQ ribs corn and cabbage, grilled pork chops w/ potatoes, smoked [place] wings Drink: water, cut back on drinking soda Snacks: trying to curtail sweets  Exercise: just finished 8 weeks of pulmonary therapy, water aerobics  Family History:  Family History  Problem Relation Age of Onset   Heart disease Father    Diabetes Father    Glaucoma Father    Cancer Mother        unknown type, ?lung   Heart disease Paternal Grandmother    Cancer Paternal Grandmother        unknown type   Diabetes Brother    Glaucoma Brother      Social History:  Social History   Socioeconomic History   Marital status: Married    Spouse name: Gerald Stabs   Number of children: 2   Years of education: Not on file   Highest education  level: Not on file  Occupational History   Occupation: unemployed    Employer: [employer]  Tobacco Use   Smoking status: Former    Packs/day: 1.00    Years: 4.00    Pack years: 4.00    Types: Cigarettes    Quit date: 02/11/1980    Years since quitting: 40.8   Smokeless tobacco: Never  Vaping Use   Vaping Use: Never used  Substance and Sexual Activity   Alcohol use: No   Drug use: No   Sexual activity: Yes  Other Topics Concern   Not on file  Social History Narrative   Lives with husband   Right hand   Drinks 2-3 cups caffeine daily   Social Determinants of Health   Financial Resource Strain: Medium Risk   Difficulty of Paying Living Expenses: Somewhat hard  Food Insecurity: No Food Insecurity   Worried About Charity fundraiser in the Last Year: Never true   Ran Out of Food in the Last Year: Never true  Transportation Needs: No Transportation Needs   Lack of Transportation (Medical): No   Lack of Transportation (Non-Medical): No  Physical Activity: Inactive   Days of Exercise per Week: 0 days   Minutes of  Exercise per Session: 0 min  Stress: Stress Concern Present   Feeling of Stress : To some extent  Social Connections: Not on file  Intimate Partner Violence: Not on file     Labs: 12/05/20 TC 177, TG 118, HDL 45, LDL 111 (zetia 80m daily)  Past Medical History:  Diagnosis Date   Anemia    3 months ago anemic   Anxiety    on meds   Arthritis    "all over" (01/15/2017)   Asthma    Bronchitis with emphysema    Chest pain    [place]    Coronary artery disease    a. 01/2017 she underwent orbital atherectomy/DES to the proxmal LAD and PTCA to ostial D2. 2D Echo 01/15/17 showed mild LVH, EF 60-65%, grade 1 DD.   Family history of anesthesia complication    daughter N/V   Fibromyalgia    GERD (gastroesophageal reflux disease)    on meds   Glaucoma    Heart murmur    History of hiatal hernia    Hx of echocardiogram    Echo  (03/2013):  Tech limited; Mild focal basal septal hypertrophy, EF 60-65%, normal RVF   Hyperlipidemia    Hypertension    Nonsustained ventricular tachycardia    OSA on CPAP    Premature atrial contractions    PVC (premature ventricular contraction)    a. Holter 12/16: NSR, occ PAC,PVCs   Sarcoidosis    Type II diabetes mellitus ([place]     Current Outpatient Medications on File Prior to Visit  Medication Sig Dispense Refill   acetaminophen (TYLENOL) 500 MG tablet Take 1,000 mg by mouth 2 (two) times daily as needed for moderate pain or headache.     albuterol (PROVENTIL) (2.5 MG/3ML) 0.083% nebulizer solution Take 3 mLs (2.5 mg total) by nebulization daily as needed for wheezing or shortness of breath. 75 mL 12   albuterol (VENTOLIN HFA) 108 (90 Base) MCG/ACT inhaler Inhale 2 puffs into the lungs every 6 (six) hours as needed for wheezing or shortness of breath. 18 g 12   aspirin EC 81 MG tablet Take 81 mg by mouth daily. Swallow whole.     Blood Glucose Monitoring Suppl (ACCU-CHEK AVIVA PLUS) w/Device KIT Use to check blood sugars 3 times a day. Dx code e11.65 1 kit 3   brimonidine (ALPHAGAN P) 0.1 % SOLN Place 1 drop into both eyes 2 (two) times daily.      brinzolamide (AZOPT) 1 % ophthalmic suspension Place 1 drop into both eyes 3 (three) times daily.     carvedilol (COREG) 12.5 MG tablet TAKE 1 TABLET(12.5 MG) BY MOUTH TWICE DAILY 180 tablet 3   clopidogrel (PLAVIX) 75 MG tablet Take 1 tablet (75 mg total) by mouth daily. 90 tablet 3   EPINEPHrine 0.3 mg/0.3 mL IJ SOAJ injection Inject 0.3 mg into the muscle as needed for anaphylaxis. 1 each 0   ezetimibe (ZETIA) 10 MG tablet Take 1 tablet (10 mg total) by mouth daily. 90 tablet 3   furosemide (LASIX) 40 MG tablet Take 1 tablet (40 mg total) by mouth every other day. 90 tablet 1   isosorbide mononitrate (IMDUR) 60 MG 24 hr tablet TAKE 1 TABLET(60 MG) BY MOUTH DAILY 90 tablet 3   ketorolac (ACULAR) 0.5 % ophthalmic solution Place 1 drop  into the right eye 3 (three) times daily.     Lancets (ONETOUCH DELICA PLUS [id number] MISC CHECK BLOOD SUGAR BEFORE BREAKFAST AND DINNER 100 each  1   montelukast (SINGULAIR) 10 MG tablet TAKE 1 TABLET(10 MG) BY MOUTH EVERY EVENING 90 tablet 1   Nebulizers (COMPRESSOR/NEBULIZER) MISC Use as directed 1 each 0   nitroGLYCERIN (NITROSTAT) 0.4 MG SL tablet Place 1 tablet (0.4 mg total) under the tongue every 5 (five) minutes as needed for chest pain. (Patient not taking: Reported on 11/08/2020) 25 tablet prn   pantoprazole (PROTONIX) 40 MG tablet Take 40 mg by mouth daily.     Polyvinyl Alcohol-Povidone PF (REFRESH) 1.4-0.6 % SOLN Place 1-2 drops into both eyes 3 (three) times daily as needed (dry/irritated eyes.).     potassium chloride SA (KLOR-CON) 20 MEQ tablet Take 1 tablet (20 mEq total) by mouth every other day. 90 tablet 1   prednisoLONE acetate (PRED FORTE) 1 % ophthalmic suspension Place 1 drop into both eyes 4 (four) times daily.      pregabalin (LYRICA) 75 MG capsule TAKE 1 CAPSULE(75 MG) BY MOUTH TWICE DAILY 180 capsule 1   sitaGLIPtin-metformin (JANUMET) 50-500 MG tablet Take 1 tablet by mouth 2 (two) times daily with a meal. 180 tablet 1   topiramate (TOPAMAX) 25 MG tablet Take 1 tablet (25 mg total) by mouth at bedtime. 120 tablet 3   Travoprost, BAK Free, (TRAVATAN) 0.004 % SOLN ophthalmic solution Place 1 drop into the right eye at bedtime.      umeclidinium-vilanterol (ANORO ELLIPTA) 62.5-25 MCG/INH AEPB Inhale 1 puff into the lungs daily. 14 each 0   No current facility-administered medications on file prior to visit.    Allergies  Allergen Reactions   Crestor [Rosuvastatin Calcium] Other (See Comments)    muscle aches   Demerol  [Meperidine Hcl]     Other reaction(s): Hallucinations   Shellfish Allergy Itching and Other (See Comments)    Crab, shrimp and lobster ---lips itch and tingle Was told not to eat again after having a allergy test. Lobster, crab and shrimp      Assessment/Plan:  1. Hyperlipidemia - LDL is above goal of <55. Patient denies any issues tolerating atorvastatin. Will resume atorvastatin 6m daily. Continue ezetimibe 129mdaily. Follow up labs in 2 months. Diet also reviewed with patient. She will resume water aerobics on Monday.   Thank you,   MeRamond DialPharm.D, BCPS, CPP [id number]. [id number] [id number]: [phone]: [phone]

## 2020-12-13 NOTE — Progress Notes (Signed)
Discharge Progress Report  Patient Details  Name: Sheila Reynolds MRN: [id number] Date of Birth: 1945-05-15 Referring Provider:   [facility] Walk Test from 10/08/2020 in [place]  Referring Provider Dr. Annamaria Boots        Number of Visits: 15  Reason for Discharge:  Patient reached a stable level of exercise. Patient independent in their exercise. Patient has met program and personal goals.  Smoking History:  Social History   Tobacco Use  Smoking Status Former   Packs/day: 1.00   Years: 4.00   Pack years: 4.00   Types: Cigarettes   Quit date: 02/11/1980   Years since quitting: 40.8  Smokeless Tobacco Never    Diagnosis:  Sarcoidosis of lung ([place])  ADL [facility]:  Pulmonary Assessment Scores     Row Name 10/08/20 1142 12/13/20 0657       ADL [facility]   ADL Phase Entry Exit    SOB Score total 50 55      CAT Score   CAT Score 29 20      mMRC Score   mMRC Score 4 --             Initial Exercise Prescription:  Initial Exercise Prescription - 10/08/20 1100       Date of Initial Exercise RX and Referring Provider   Date 10/08/20    Referring Provider Dr. Annamaria Boots    Expected Discharge Date 12/13/20      NuStep   Level 1    SPM 70    Minutes 15      Arm Ergometer   Level 1    Watts 10    Minutes 15      Prescription Details   Frequency (times per week) 2    Duration Progress to 30 minutes of continuous aerobic without signs/symptoms of physical distress      Intensity   THRR 40-80% of Max Heartrate 59-118    Ratings of Perceived Exertion 11-13    Perceived Dyspnea 0-4      Progression   Progression Continue to progress workloads to maintain intensity without signs/symptoms of physical distress.      Resistance Training   Training Prescription Yes    Weight Red bands    Reps 10-15             Discharge Exercise Prescription (Final Exercise Prescription Changes):  Exercise Prescription  Changes - 12/11/20 1100       Response to Exercise   Blood Pressure (Admit) 138/90    Blood Pressure (Exercise) 150/64    Blood Pressure (Exit) 134/90    Heart Rate (Admit) 84 bpm    Heart Rate (Exercise) 117 bpm    Heart Rate (Exit) 98 bpm    Oxygen Saturation (Admit) 96 %    Oxygen Saturation (Exercise) 95 %    Oxygen Saturation (Exit) 98 %    Rating of Perceived Exertion (Exercise) 14    Perceived Dyspnea (Exercise) 2    Duration Continue with 30 min of aerobic exercise without signs/symptoms of physical distress.    Intensity THRR unchanged      Progression   Progression Continue to progress workloads to maintain intensity without signs/symptoms of physical distress.      Resistance Training   Training Prescription Yes    Weight Red bands    Reps 10-15    Time 10 Minutes      NuStep   Level 5    SPM 80  Minutes 15    METs 2.5      Arm Ergometer   Level 4.5    Watts 35    Minutes 15             Functional Capacity:  6 Minute Walk     Row Name 10/08/20 1105 12/13/20 1156       6 Minute Walk   Phase Initial Discharge    Distance 935 feet  Used rollator 1400 feet    Distance % Change -- 49.73 %    Distance Feet Change -- 465 ft    Walk Time 6 minutes 6 minutes    # of Rest Breaks 0 0    MPH 1.77 2.65    METS 1.8 3.15    RPE 14 15    Perceived Dyspnea  2 3    VO2 Peak 6.3 11.04    Symptoms -- No    Resting HR 89 bpm 93 bpm    Resting BP 122/70 128/70    Resting Oxygen Saturation  99 % 95 %    Exercise Oxygen Saturation  during 6 min walk 98 % 92 %    Max Ex. HR 104 bpm 138 bpm    Max Ex. BP 130/80 150/72    2 Minute Post BP 124/70 124/72      Interval HR   1 Minute HR 96 111    2 Minute HR 100 121    3 Minute HR 101 128    4 Minute HR 104 137    5 Minute HR 102 133    6 Minute HR 102 138    2 Minute Post HR 69 91    Interval Heart Rate? Yes Yes      Interval Oxygen   Interval Oxygen? Yes Yes    Baseline Oxygen Saturation % 99 % 95 %     1 Minute Oxygen Saturation % 100 % 95 %    1 Minute Liters of Oxygen 0 L 0 L    2 Minute Oxygen Saturation % 99 % 92 %    2 Minute Liters of Oxygen 0 L 0 L    3 Minute Oxygen Saturation % 98 % 93 %    3 Minute Liters of Oxygen 0 L 0 L    4 Minute Oxygen Saturation % 100 % 93 %    4 Minute Liters of Oxygen 0 L 0 L    5 Minute Oxygen Saturation % 100 % 92 %    5 Minute Liters of Oxygen 0 L 0 L    6 Minute Oxygen Saturation % 98 % 92 %    6 Minute Liters of Oxygen 0 L 0 L    2 Minute Post Oxygen Saturation % 99 % 98 %    2 Minute Post Liters of Oxygen 0 L 0 L             Psychological, QOL, Others - Outcomes: PHQ 2/9: Depression screen [facility] 2/9 12/13/2020 10/08/2020 03/28/2020 03/23/2019  Decreased Interest 0 0 0 1  Down, Depressed, Hopeless 0 1 0 1  PHQ - 2 Score 0 1 0 2  Altered sleeping 1 1 - 3  Tired, decreased energy 1 1 - 1  Change in appetite 0 0 - 0  Feeling bad or failure about yourself  0 0 - 0  Trouble concentrating 1 0 - 1  Moving slowly or fidgety/restless 0 0 - 0  Suicidal thoughts 0 0 - 0  PHQ-9 Score 3 - - 7  Difficult doing work/chores - - - Somewhat difficult  Some recent data might be hidden    Quality of Life:   Personal Goals: Goals established at orientation with interventions provided to work toward goal.  Personal Goals and Risk Factors at Admission - 10/08/20 1145       Core Components/Risk Factors/Patient Goals on Admission   Improve shortness of breath with ADL's Yes    Intervention Provide education, individualized exercise plan and daily activity instruction to help decrease symptoms of SOB with activities of daily living.    Expected Outcomes Short Term: Improve cardiorespiratory fitness to achieve a reduction of symptoms when performing ADLs;Long Term: Be able to perform more ADLs without symptoms or delay the onset of symptoms              Personal Goals Discharge:  Goals and Risk Factor Review     Row Name 11/12/20 1142 12/11/20  1358           Core Components/Risk Factors/Patient Goals Review   Personal Goals Review Increase knowledge of respiratory medications and ability to use respiratory devices properly.;Improve shortness of breath with ADL's;Develop more efficient breathing techniques such as purse lipped breathing and diaphragmatic breathing and practicing self-pacing with activity. Increase knowledge of respiratory medications and ability to use respiratory devices properly.;Improve shortness of breath with ADL's;Develop more efficient breathing techniques such as purse lipped breathing and diaphragmatic breathing and practicing self-pacing with activity.      Review "Sheila Reynolds" has sarcoidosis fo the lung and has increased her exercise slowly over the last 4 weeks she has participated in pulmonary rehab. Some days are better than others, she works less hard on those days. Sheila Reynolds has improved her strength and stamina while exercising in pulmonary rehab.  She feels she has become stronger and has advanced to level 4.5 on the arm ergomenter and 2.5 mets on the nustep.      Expected Outcomes See admission goals. See admission goals.               Exercise Goals and Review:  Exercise Goals     Row Name 10/08/20 1128 10/19/20 0930 11/13/20 0703 12/10/20 0935       Exercise Goals   Increase Physical Activity Yes Yes Yes Yes    Intervention -- Provide advice, education, support and counseling about physical activity/exercise needs.;Develop an individualized exercise prescription for aerobic and resistive training based on initial evaluation findings, risk stratification, comorbidities and participant's personal goals. Provide advice, education, support and counseling about physical activity/exercise needs.;Develop an individualized exercise prescription for aerobic and resistive training based on initial evaluation findings, risk stratification, comorbidities and participant's personal goals. Provide advice, education,  support and counseling about physical activity/exercise needs.;Develop an individualized exercise prescription for aerobic and resistive training based on initial evaluation findings, risk stratification, comorbidities and participant's personal goals.    Expected Outcomes Short Term: Attend rehab on a regular basis to increase amount of physical activity.;Long Term: Add in home exercise to make exercise part of routine and to increase amount of physical activity.;Long Term: Exercising regularly at least 3-5 days a week. Short Term: Attend rehab on a regular basis to increase amount of physical activity.;Long Term: Add in home exercise to make exercise part of routine and to increase amount of physical activity.;Long Term: Exercising regularly at least 3-5 days a week. Short Term: Attend rehab on a regular basis to increase amount of physical activity.;Long Term: Add in home  exercise to make exercise part of routine and to increase amount of physical activity.;Long Term: Exercising regularly at least 3-5 days a week. Short Term: Attend rehab on a regular basis to increase amount of physical activity.;Long Term: Add in home exercise to make exercise part of routine and to increase amount of physical activity.;Long Term: Exercising regularly at least 3-5 days a week.    Increase Strength and Stamina Yes Yes Yes Yes    Intervention -- Provide advice, education, support and counseling about physical activity/exercise needs.;Develop an individualized exercise prescription for aerobic and resistive training based on initial evaluation findings, risk stratification, comorbidities and participant's personal goals. Provide advice, education, support and counseling about physical activity/exercise needs.;Develop an individualized exercise prescription for aerobic and resistive training based on initial evaluation findings, risk stratification, comorbidities and participant's personal goals. Provide advice, education,  support and counseling about physical activity/exercise needs.;Develop an individualized exercise prescription for aerobic and resistive training based on initial evaluation findings, risk stratification, comorbidities and participant's personal goals.    Expected Outcomes Short Term: Increase workloads from initial exercise prescription for resistance, speed, and METs.;Short Term: Perform resistance training exercises routinely during rehab and add in resistance training at home;Long Term: Improve cardiorespiratory fitness, muscular endurance and strength as measured by increased METs and functional capacity (6MWT) Short Term: Increase workloads from initial exercise prescription for resistance, speed, and METs.;Short Term: Perform resistance training exercises routinely during rehab and add in resistance training at home;Long Term: Improve cardiorespiratory fitness, muscular endurance and strength as measured by increased METs and functional capacity (6MWT) Short Term: Increase workloads from initial exercise prescription for resistance, speed, and METs.;Short Term: Perform resistance training exercises routinely during rehab and add in resistance training at home;Long Term: Improve cardiorespiratory fitness, muscular endurance and strength as measured by increased METs and functional capacity (6MWT) Short Term: Increase workloads from initial exercise prescription for resistance, speed, and METs.;Short Term: Perform resistance training exercises routinely during rehab and add in resistance training at home;Long Term: Improve cardiorespiratory fitness, muscular endurance and strength as measured by increased METs and functional capacity (6MWT)    Able to understand and use rate of perceived exertion (RPE) scale Yes Yes Yes Yes    Intervention Provide education and explanation on how to use RPE scale Provide education and explanation on how to use RPE scale Provide education and explanation on how to use RPE  scale Provide education and explanation on how to use RPE scale    Expected Outcomes Short Term: Able to use RPE daily in rehab to express subjective intensity level;Long Term:  Able to use RPE to guide intensity level when exercising independently Short Term: Able to use RPE daily in rehab to express subjective intensity level;Long Term:  Able to use RPE to guide intensity level when exercising independently Short Term: Able to use RPE daily in rehab to express subjective intensity level;Long Term:  Able to use RPE to guide intensity level when exercising independently Short Term: Able to use RPE daily in rehab to express subjective intensity level;Long Term:  Able to use RPE to guide intensity level when exercising independently    Able to understand and use Dyspnea scale Yes Yes Yes Yes    Intervention Provide education and explanation on how to use Dyspnea scale Provide education and explanation on how to use Dyspnea scale Provide education and explanation on how to use Dyspnea scale Provide education and explanation on how to use Dyspnea scale    Expected Outcomes Short Term:  Able to use Dyspnea scale daily in rehab to express subjective sense of shortness of breath during exertion;Long Term: Able to use Dyspnea scale to guide intensity level when exercising independently Short Term: Able to use Dyspnea scale daily in rehab to express subjective sense of shortness of breath during exertion;Long Term: Able to use Dyspnea scale to guide intensity level when exercising independently Short Term: Able to use Dyspnea scale daily in rehab to express subjective sense of shortness of breath during exertion;Long Term: Able to use Dyspnea scale to guide intensity level when exercising independently Short Term: Able to use Dyspnea scale daily in rehab to express subjective sense of shortness of breath during exertion;Long Term: Able to use Dyspnea scale to guide intensity level when exercising independently     Knowledge and understanding of Target Heart Rate Range (THRR) Yes Yes Yes Yes    Intervention Provide education and explanation of THRR including how the numbers were predicted and where they are located for reference Provide education and explanation of THRR including how the numbers were predicted and where they are located for reference Provide education and explanation of THRR including how the numbers were predicted and where they are located for reference Provide education and explanation of THRR including how the numbers were predicted and where they are located for reference    Expected Outcomes Short Term: Able to state/look up THRR;Short Term: Able to use daily as guideline for intensity in rehab;Long Term: Able to use THRR to govern intensity when exercising independently Short Term: Able to state/look up THRR;Short Term: Able to use daily as guideline for intensity in rehab;Long Term: Able to use THRR to govern intensity when exercising independently Short Term: Able to state/look up THRR;Short Term: Able to use daily as guideline for intensity in rehab;Long Term: Able to use THRR to govern intensity when exercising independently Short Term: Able to state/look up THRR;Short Term: Able to use daily as guideline for intensity in rehab;Long Term: Able to use THRR to govern intensity when exercising independently    Understanding of Exercise Prescription Yes Yes Yes Yes    Intervention Provide education, explanation, and written materials on patient's individual exercise prescription Provide education, explanation, and written materials on patient's individual exercise prescription Provide education, explanation, and written materials on patient's individual exercise prescription Provide education, explanation, and written materials on patient's individual exercise prescription    Expected Outcomes Short Term: Able to explain program exercise prescription;Long Term: Able to explain home exercise  prescription to exercise independently Short Term: Able to explain program exercise prescription;Long Term: Able to explain home exercise prescription to exercise independently Short Term: Able to explain program exercise prescription;Long Term: Able to explain home exercise prescription to exercise independently Short Term: Able to explain program exercise prescription;Long Term: Able to explain home exercise prescription to exercise independently             Exercise Goals Re-Evaluation:  Exercise Goals Re-Evaluation     Row Name 10/19/20 0931 11/13/20 0703 12/10/20 0935         Exercise Goal Re-Evaluation   Exercise Goals Review Increase Physical Activity;Increase Strength and Stamina;Able to understand and use rate of perceived exertion (RPE) scale;Able to understand and use Dyspnea scale;Knowledge and understanding of Target Heart Rate Range (THRR);Understanding of Exercise Prescription Increase Physical Activity;Increase Strength and Stamina;Able to understand and use rate of perceived exertion (RPE) scale;Able to understand and use Dyspnea scale;Knowledge and understanding of Target Heart Rate Range (THRR);Understanding of Exercise Prescription Increase Physical  Activity;Increase Strength and Stamina;Able to understand and use rate of perceived exertion (RPE) scale;Able to understand and use Dyspnea scale;Knowledge and understanding of Target Heart Rate Range (THRR);Understanding of Exercise Prescription     Comments Inell has completed 2 exercise sessions. She is motivated to exercise and tolerates it well. Neave has averaged 34 rpm at level 1 on the arm ergometer and 2.1 METs at level 1 on the Nustep. She performs the warmup and cooldown standing up. Will continue to monitor and progress as able. Omaira has completed 7 exercise sessions. She tolerates well and is willing to progress. Paddy has averaged 32 rpm at level 3 on the arm ergometer and 2.2 METs at level 3 on the  Nustep. She performs the warmup and cooldown standing up. Will continue to monitor and progress as able. Ankita has completed 13 exercise sessions. She tolerates progressions well. Shelbe has averaged 34 rpm at level 4.5 on the arm ergometer and 2.2 METs at level 4 on the Nustep. She performs the warmup and cooldown standing up. We have discussed Vieve's home exercise since she is close to graduate. She loves going to the pool and participating in water aerobic classes. I encouraged to her get back into water aerobics classes this week. Aura is motivated to exercise. Will continue to monitor.     Expected Outcomes Through exercise at rehab and home, Alaney will decrease SOB with daily activities and feel confident in carrying out an exercise regimen at home. Through exercise at rehab and home, Wanda will decrease shortness of breath with daily activities and feel confident in carrying out an exercise regimen at home. Through exercise at rehab and home, Marjie will decrease shortness of breath with daily activities and feel confident in carrying out an exercise regimen at home.              Nutrition & Weight - Outcomes:  Pre Biometrics - 10/08/20 1017       Pre Biometrics   Height $Remov'5\' 2"'MMMuXX$  (1.575 m)    Weight 80 kg    BMI (Calculated) 32.25    Grip Strength 16.5 kg              Nutrition:  Nutrition Therapy & Goals - 10/23/20 1206       Nutrition Therapy   Diet TLC    Drug/Food Interactions Statins/Certain Fruits      Personal Nutrition Goals   Nutrition Goal Pt to identify and limit food sources of saturated fat, trans fat, refined carbohydrates and sodium    Personal Goal #2 Pt to build a healthy plate including vegetables, fruits, whole grains, and low-fat dairy products in a heart healthy meal plan.    Personal Goal #3 Pt to read labels to choose lower sodium foods      Intervention Plan   Intervention Prescribe, educate and counsel regarding  individualized specific dietary modifications aiming towards targeted core components such as weight, hypertension, lipid management, diabetes, heart failure and other comorbidities.;Nutrition handout(s) given to patient.    Expected Outcomes Short Term Goal: A plan has been developed with personal nutrition goals set during dietitian appointment.;Long Term Goal: Adherence to prescribed nutrition plan.             Nutrition Discharge:   Education Questionnaire Score:  Knowledge Questionnaire Score - 12/13/20 0717       Knowledge Questionnaire Score   Post Score 17/18             Goals reviewed with patient;  copy given to patient.

## 2020-12-13 NOTE — Patient Instructions (Addendum)
Resume atrovastatin 80mg  daily. Continue taking zetia 10mg  daily.  Please come for fasting labs on 12/28. The lab is open from 7:30-4:30  Call me at [phone] with any questions

## 2020-12-13 NOTE — Progress Notes (Signed)
Daily Session Note  Patient Details  Name: Sheila Reynolds MRN: [id number] Date of Birth: [birth year] Referring Provider:   April Manson [facility]monary Rehab Walk Test from 10/08/2020 in [place]  Referring Provider Dr. Annamaria Boots       Encounter Date: 12/13/2020  Check In:  Session Check In - 12/13/20 1127       Check-In   Supervising physician immediately available to respond to emergencies [facility] immediately available    Physician(s) Dr. Tyrell Antonio    Location [facility]    Staff Present Rosebud Poles, RN, Quentin Ore, MS, ACSM-CEP, Exercise Physiologist    Virtual Visit No    Medication changes reported     No    Fall or balance concerns reported    No    Tobacco Cessation No Change    Warm-up and Cool-down Not performed (comment)    Resistance Training Performed No    VAD Patient? No    PAD/SET Patient? No      Pain Assessment   Currently in Pain? No/denies    Multiple Pain Sites No             Capillary Blood Glucose: No results found for this or any previous visit (from the past 24 hour(s)).    Social History   Tobacco Use  Smoking Status Former   Packs/day: 1.00   Years: 4.00   Pack years: 4.00   Types: Cigarettes   Quit date: 02/11/1980   Years since quitting: 40.8  Smokeless Tobacco Never    Goals Met:  Proper associated with RPD/PD & O2 Sat Exercise tolerated well No report of concerns or symptoms today  Goals Unmet:  Not Applicable  Comments: Service time is from 1017 to 1045. Pt completed 6 MWT.   Dr. Rodman Pickle is Medical Director for Pulmonary Rehab at [facility].

## 2020-12-20 ENCOUNTER — Other Ambulatory Visit: Payer: Self-pay | Admitting: Cardiovascular Disease

## 2020-12-20 MED ORDER — ISOSORBIDE MONONITRATE ER 60 MG PO TB24: ORAL_TABLET | ORAL | 2 refills | Status: DC

## 2020-12-21 ENCOUNTER — Ambulatory Visit (HOSPITAL_COMMUNITY): Admission: RE | Admit: 2020-12-21 | Payer: Medicare Other | Source: Ambulatory Visit

## 2020-12-28 DIAGNOSIS — Z23 Encounter for immunization: Secondary | ICD-10-CM | POA: Diagnosis not present

## 2021-01-14 ENCOUNTER — Telehealth: Payer: Medicare Other

## 2021-01-14 ENCOUNTER — Other Ambulatory Visit: Payer: Self-pay | Admitting: Internal Medicine

## 2021-01-14 ENCOUNTER — Ambulatory Visit (INDEPENDENT_AMBULATORY_CARE_PROVIDER_SITE_OTHER): Payer: Medicare Other

## 2021-01-14 DIAGNOSIS — M797 Fibromyalgia: Secondary | ICD-10-CM

## 2021-01-14 DIAGNOSIS — E559 Vitamin D deficiency, unspecified: Secondary | ICD-10-CM

## 2021-01-14 DIAGNOSIS — D869 Sarcoidosis, unspecified: Secondary | ICD-10-CM

## 2021-01-14 DIAGNOSIS — I890 Lymphedema, not elsewhere classified: Secondary | ICD-10-CM

## 2021-01-14 DIAGNOSIS — E1165 Type 2 diabetes mellitus with hyperglycemia: Secondary | ICD-10-CM

## 2021-01-14 DIAGNOSIS — I739 Peripheral vascular disease, unspecified: Secondary | ICD-10-CM

## 2021-01-14 DIAGNOSIS — I10 Essential (primary) hypertension: Secondary | ICD-10-CM

## 2021-01-14 MED ORDER — AZELASTINE HCL 0.1 % NA SOLN: 1.0000 | Freq: Two times a day (BID) | NASAL | 1 refills | Status: DC

## 2021-01-15 NOTE — Patient Instructions (Signed)
Visit Information   Thank you for taking time to visit with me today. Please don't hesitate to contact me if I can be of assistance to you before our next scheduled telephone appointment.  Following are the goals we discussed today:  (Copy and paste patient goals from clinical care plan here)  Our next appointment is by telephone on 02/21/21 at 11:05 AM  Please call the care guide team at [phone] if you need to cancel or reschedule your appointment.   Following is a copy of your full care plan:    Care Plan : [place] of Care  Updates made by Lynne Logan, RN since 01/14/2021 12:00 AM     Problem: No plan of care established for management of chronic disease states Uncontrolled type two DM with hyperglycemia, Vitamin D deficiency,  Fibromyalgia, Hypertension, Peripheral Vascular disease, Lymphedema of both lower extremities, Sarcoidosis   Priority: High     Long-Range Goal: Development of plan of care for chronic disease management for Uncontrolled type two DM with hyperglycemia, Vitamin D deficiency,  Fibromyalgia, Hypertension, Peripheral Vascular disease, Lymphedema of both lower extremities, Sarcoidosis   Start Date: 01/14/2021  Expected End Date: 01/14/2022  This Visit's Progress: On track  Priority: High  Note:   Current Barriers:  Knowledge Deficits related to plan of care for management of Uncontrolled type two DM with hyperglycemia, Vitamin D deficiency,  Fibromyalgia, Hypertension, Peripheral Vascular disease, Lymphedema of both lower extremities, Sarcoidosis  Chronic Disease Management support and education needs related to Uncontrolled type two DM with hyperglycemia, Vitamin D deficiency,  Fibromyalgia, Hypertension, Peripheral Vascular disease, Lymphedema of both lower extremities, Sarcoidosis   RNCM Clinical Goal(s):  Patient will verbalize basic understanding of  Uncontrolled type two DM with hyperglycemia, Vitamin D deficiency,  Fibromyalgia,  Hypertension, Peripheral Vascular disease, Lymphedema of both lower extremities, Sarcoidosis disease process and self health management plan as evidenced by patient will report having no disease exacerbations related to her chronic disease states listed above take all medications exactly as prescribed and will call provider for medication related questions as evidenced by patient will report taking her medications exactly as prescribed  demonstrate Improved health management independence as evidenced by patient will report 100% with adhering to her prescribed treatment plan  continue to work with RN Care Manager to address care management and care coordination needs related to  Uncontrolled type two DM with hyperglycemia, Vitamin D deficiency,  Fibromyalgia, Hypertension, Peripheral Vascular disease, Lymphedema of both lower extremities, Sarcoidosis as evidenced by adherence to CM Team Scheduled appointments demonstrate ongoing self health care management ability   as evidenced by    through collaboration with RN Care manager, provider, and care team.   Interventions: 1:1 collaboration with primary care provider regarding development and update of comprehensive plan of care as evidenced by provider attestation and co-signature Inter-disciplinary care team collaboration (see longitudinal plan of care) Evaluation of current treatment plan related to  self management and patient's adherence to plan as established by provider  Diabetes Interventions:  (Status:  Goal on track:  Yes.) Long Term Goal Assessed patient's understanding of A1c goal:  <5.7 Provided education to patient about basic DM disease process Reviewed medications with patient and discussed importance of medication adherence Counseled on importance of regular laboratory monitoring as prescribed Provided patient with written educational materials related to hypo and hyperglycemia and importance of correct treatment Review of patient  status, including review of consultants reports, relevant laboratory and other test results, and  medications completed Discussed plans with patient for ongoing care management follow up and provided patient with direct contact information for care management team Lab Results  Component Value Date   HGBA1C 6.3 (H) 11/08/2020   Lymphedema Interventions:  (Status:  Goal on track:  Yes.)  Long Term Goal Evaluation of current treatment plan related to  lymphedema , self-management and patient's adherence to plan as established by providers Review of patient status, including review of consultant's reports, relevant laboratory and other test results, and medications completed Reviewed medications with patient and discussed importance of medication adherence Determined patient continues to use her lymphedema pumps as needed Educated on benefits for ongoing elevation of lower extremities as directed, continue to do daily leg/foot inspections and report changes to skin integrity promptly Discussed plans with patient for ongoing care management follow up and provided patient with direct contact information for care management team  Vitamin D deficiency Interventions: (Status:  Goal on track:  Yes.)  Long Term Goal Evaluation of current treatment plan related to  Vitamin D deficiency , self-management and patient's adherence to plan as established by provider Provided education to patient about basic disease process related to Vitamin D deficiency Review of patient status, including review of consultant's reports, relevant laboratory and other test results, and medications completed Reviewed medications with patient and discussed importance of medication adherence Educated on the effectiveness of eating a Vitamin D rich diet and getting at least 15 minutes of natural sunlight when possible  Discussed plans with patient for ongoing care management follow up and provided patient with direct contact  information for care management team  Vit D, 25-Hydroxy 30.0 - 100.0 ng/mL 33.9  34.9 CM   Pulmonary Sarcoidosis Interventions:  Goal on track:  Yes.)  Long Term Goal Evaluation of current treatment plan related to  Pulmonary Sarcoidosis , self-management and patient's adherence to plan as established by provider Review of patient status, including review of consultant's reports, relevant laboratory and other test results, and medications completed Reviewed medications with patient and discussed importance of medication adherence Determined patient continues to have her Sarcoidosis managed by Dr. Annamaria Boots with most recent follow up completed on 12/03/20 with the following recommendations reviewed:  Return in about 6 months (around 06/03/2021). Order- schedule CTa chest PE protocol with and without contrast    dx suspect PE, ILD, abnormal CT chest Order- DME [facility]- Please replace old CPAP machine, change to auto 5-15, mask of choice, humidifier, supplies, Airview/ card When you sit, try to elevate your legs to reduce fluid pressure in your calves. Follow-up with your vein and vascular doctor as planned Please call as needed    Determined patient completed pulmonary rehab with good effectiveness Determined patient verbalizes understanding of her prescribed treatment plan and when to call her doctor if needed  Discussed plans with patient for ongoing care management follow up and provided patient with direct contact information for care management team  Patient Goals/Self-Care Activities: Take all medications as prescribed Attend all scheduled provider appointments Call pharmacy for medication refills 3-7 days in advance of running out of medications Perform all self care activities independently  drink 6 to 8 glasses of water each day manage portion size  Follow Up Plan:  Telephone follow up appointment with care management team member scheduled for:  02/21/21      Consent to CCM  Services: Ms. Vogelgesang was given information about Chronic Care Management services including:  CCM service includes personalized support from designated clinical staff supervised  by her physician, including individualized plan of care and coordination with other care providers 24/7 contact phone numbers for assistance for urgent and routine care needs. Service will only be billed when office clinical staff spend 20 minutes or more in a month to coordinate care. Only one practitioner may furnish and bill the service in a calendar month. The patient may stop CCM services at any time (effective at the end of the month) by phone call to the office staff. The patient will be responsible for cost sharing (co-pay) of up to 20% of the service fee (after annual deductible is met).  Patient agreed to services and verbal consent obtained.   Patient verbalizes understanding of instructions provided today and agrees to view in [place].   Telephone follow up appointment with care management team member scheduled for: 02/21/21

## 2021-01-15 NOTE — Chronic Care Management (AMB) (Signed)
Chronic Care Management   CCM RN Visit Note  01/14/2021 Name: Sheila Reynolds MRN: [id number] DOB: [birth year]  Subjective: Sheila Reynolds is a 75 y.o. year old female who is a primary care patient of Glendale Chard, MD. The care management team was consulted for assistance with disease management and care coordination needs.    Engaged with patient by telephone for follow up visit in response to provider referral for case management and/or care coordination services.   Consent to Services:  The patient was given information about Chronic Care Management services, agreed to services, and gave verbal consent prior to initiation of services.  Please see initial visit note for detailed documentation.   Patient agreed to services and verbal consent obtained.   Assessment: Review of patient past medical history, allergies, medications, health status, including review of consultants reports, laboratory and other test data, was performed as part of comprehensive evaluation and provision of chronic care management services.   SDOH (Social Determinants of Health) assessments and interventions performed:  Yes, no acute changes   CCM Care Plan  Allergies  Allergen Reactions   Crestor [Rosuvastatin Calcium] Other (See Comments)    muscle aches   Demerol  [Meperidine Hcl]     Other reaction(s): Hallucinations   Shellfish Allergy Itching and Other (See Comments)    Crab, shrimp and lobster ---lips itch and tingle Was told not to eat again after having a allergy test. Lobster, crab and shrimp     Outpatient Encounter Medications as of 01/14/2021  Medication Sig   acetaminophen (TYLENOL) 500 MG tablet Take 1,000 mg by mouth 2 (two) times daily as needed for moderate pain or headache.   albuterol (PROVENTIL) (2.5 MG/3ML) 0.083% nebulizer solution Take 3 mLs (2.5 mg total) by nebulization daily as needed for wheezing or shortness of breath.   albuterol (VENTOLIN HFA) 108 (90 Base) MCG/ACT  inhaler Inhale 2 puffs into the lungs every 6 (six) hours as needed for wheezing or shortness of breath.   aspirin EC 81 MG tablet Take 81 mg by mouth daily. Swallow whole.   atorvastatin (LIPITOR) 80 MG tablet Take 1 tablet (80 mg total) by mouth daily.   azelastine (ASTELIN) 0.1 % nasal spray Place 1 spray into both nostrils 2 (two) times daily. Use in each nostril as directed   Blood Glucose Monitoring Suppl (ACCU-CHEK AVIVA PLUS) w/Device KIT Use to check blood sugars 3 times a day. Dx code e11.65   brimonidine (ALPHAGAN P) 0.1 % SOLN Place 1 drop into both eyes 2 (two) times daily.    brinzolamide (AZOPT) 1 % ophthalmic suspension Place 1 drop into both eyes 3 (three) times daily.   carvedilol (COREG) 12.5 MG tablet TAKE 1 TABLET(12.5 MG) BY MOUTH TWICE DAILY   clopidogrel (PLAVIX) 75 MG tablet Take 1 tablet (75 mg total) by mouth daily.   EPINEPHrine 0.3 mg/0.3 mL IJ SOAJ injection Inject 0.3 mg into the muscle as needed for anaphylaxis.   ezetimibe (ZETIA) 10 MG tablet Take 1 tablet (10 mg total) by mouth daily.   furosemide (LASIX) 40 MG tablet Take 1 tablet (40 mg total) by mouth every other day.   isosorbide mononitrate (IMDUR) 60 MG 24 hr tablet TAKE 1 TABLET(60 MG) BY MOUTH DAILY   ketorolac (ACULAR) 0.5 % ophthalmic solution Place 1 drop into the right eye 3 (three) times daily.   Lancets (ONETOUCH DELICA PLUS [id number]) MISC CHECK BLOOD SUGAR BEFORE BREAKFAST AND DINNER   montelukast (SINGULAIR) 10 MG tablet TAKE  1 TABLET(10 MG) BY MOUTH EVERY EVENING   Nebulizers (COMPRESSOR/NEBULIZER) MISC Use as directed   pantoprazole (PROTONIX) 40 MG tablet Take 40 mg by mouth daily.   Polyvinyl Alcohol-Povidone PF (REFRESH) 1.4-0.6 % SOLN Place 1-2 drops into both eyes 3 (three) times daily as needed (dry/irritated eyes.).   potassium chloride SA (KLOR-CON) 20 MEQ tablet Take 1 tablet (20 mEq total) by mouth every other day.   prednisoLONE acetate (PRED FORTE) 1 % ophthalmic suspension Place 1  drop into both eyes 4 (four) times daily.    pregabalin (LYRICA) 75 MG capsule TAKE 1 CAPSULE(75 MG) BY MOUTH TWICE DAILY   sitaGLIPtin-metformin (JANUMET) 50-500 MG tablet Take 1 tablet by mouth 2 (two) times daily with a meal.   topiramate (TOPAMAX) 25 MG tablet Take 1 tablet (25 mg total) by mouth at bedtime.   Travoprost, BAK Free, (TRAVATAN) 0.004 % SOLN ophthalmic solution Place 1 drop into the right eye at bedtime.    umeclidinium-vilanterol (ANORO ELLIPTA) 62.5-25 MCG/INH AEPB Inhale 1 puff into the lungs daily.   No facility-administered encounter medications on file as of 01/14/2021.    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) ([place]) 04/13/2020   Chronic diastolic heart failure ([place]) 03/28/2020   Uncontrolled type 2 diabetes mellitus with hyperglycemia ([place]) 07/25/2019   Hiatal hernia 07/25/2019   Spontaneous ecchymoses 07/25/2019   Class 1 obesity due to excess calories with serious comorbidity and body mass index (BMI) of 32.0 to 32.9 in adult 07/25/2019   Peripheral vascular disease, unspecified ([place]) 03/23/2019   Uveitis 06/01/2017   Fibromyalgia 06/01/2017   DDD (degenerative disc disease), lumbar 06/01/2017   DDD (degenerative disc disease), thoracic 06/01/2017   History of hypercholesterolemia 06/01/2017   History of anxiety 06/01/2017   History of IBS 06/01/2017   Premature atrial contractions 03/30/2017   PVC's (premature ventricular contractions) 03/30/2017   Diabetes mellitus without complication ([place]) 19/50/9326   Angina pectoris ([employer]) 01/15/2017   CAD (coronary artery disease) 01/12/2017   Shortness of breath 12/12/2016   Acute maxillary sinusitis 05/03/2014   Obstructive sleep apnea 11/04/2013   Multiple lung nodules 07/18/2012   Heart palpitations 03/11/2012    HTN (hypertension) 03/11/2012   Gastroesophageal reflux disease without esophagitis 07/13/2011   Sarcoidosis 02/08/2011   COPD with acute exacerbation ([place]) 02/05/2011   Hyperlipemia 09/24/2010   Atypical chest pain 09/24/2010   NSVT (nonsustained ventricular tachycardia) ([place]) 09/24/2010   Leg edema 09/24/2010    Conditions to be addressed/monitored: Uncontrolled type two DM with hyperglycemia, Vitamin D deficiency,  Fibromyalgia, Hypertension, Peripheral Vascular disease, Lymphedema of both lower extremities, Sarcoidosis   Care Plan : RN Care Manager Plan of Care  Updates made by Lynne Logan, RN since 01/14/2021 12:00 AM     Problem: No plan of care established for management of chronic disease states Uncontrolled type two DM with hyperglycemia, Vitamin D deficiency,  Fibromyalgia, Hypertension, Peripheral Vascular disease, Lymphedema of both lower extremities, Sarcoidosis   Priority: High     Long-Range Goal: Development of plan of care for chronic disease management for Uncontrolled type two DM with hyperglycemia, Vitamin D deficiency,  Fibromyalgia, Hypertension, Peripheral Vascular disease, Lymphedema of both lower extremities, Sarcoidosis   Start Date: 01/14/2021  Expected End Date: 01/14/2022  This Visit's Progress: On track  Priority: High  Note:  Current Barriers:  Knowledge Deficits related to plan of care for management of Uncontrolled type two DM with hyperglycemia, Vitamin D deficiency,  Fibromyalgia, Hypertension, Peripheral Vascular disease, Lymphedema of both lower extremities, Sarcoidosis  Chronic Disease Management support and education needs related to Uncontrolled type two DM with hyperglycemia, Vitamin D deficiency,  Fibromyalgia, Hypertension, Peripheral Vascular disease, Lymphedema of both lower extremities, Sarcoidosis   RNCM Clinical Goal(s):  Patient will verbalize basic understanding of  Uncontrolled type two DM with hyperglycemia, Vitamin D deficiency,   Fibromyalgia, Hypertension, Peripheral Vascular disease, Lymphedema of both lower extremities, Sarcoidosis disease process and self health management plan as evidenced by patient will report having no disease exacerbations related to her chronic disease states listed above take all medications exactly as prescribed and will call provider for medication related questions as evidenced by patient will report taking her medications exactly as prescribed  demonstrate Improved health management independence as evidenced by patient will report 100% with adhering to her prescribed treatment plan  continue to work with RN Care Manager to address care management and care coordination needs related to  Uncontrolled type two DM with hyperglycemia, Vitamin D deficiency,  Fibromyalgia, Hypertension, Peripheral Vascular disease, Lymphedema of both lower extremities, Sarcoidosis as evidenced by adherence to CM Team Scheduled appointments demonstrate ongoing self health care management ability   as evidenced by    through collaboration with RN Care manager, provider, and care team.   Interventions: 1:1 collaboration with primary care provider regarding development and update of comprehensive plan of care as evidenced by provider attestation and co-signature Inter-disciplinary care team collaboration (see longitudinal plan of care) Evaluation of current treatment plan related to  self management and patient's adherence to plan as established by provider  Diabetes Interventions:  (Status:  Goal on track:  Yes.) Long Term Goal Assessed patient's understanding of A1c goal:  <5.7 Provided education to patient about basic DM disease process Reviewed medications with patient and discussed importance of medication adherence Counseled on importance of regular laboratory monitoring as prescribed Provided patient with written educational materials related to hypo and hyperglycemia and importance of correct treatment Review of  patient status, including review of consultants reports, relevant laboratory and other test results, and medications completed Discussed plans with patient for ongoing care management follow up and provided patient with direct contact information for care management team Lab Results  Component Value Date   HGBA1C 6.3 (H) 11/08/2020   Lymphedema Interventions:  (Status:  Goal on track:  Yes.)  Long Term Goal Evaluation of current treatment plan related to  lymphedema , self-management and patient's adherence to plan as established by providers Review of patient status, including review of consultant's reports, relevant laboratory and other test results, and medications completed Reviewed medications with patient and discussed importance of medication adherence Determined patient continues to use her lymphedema pumps as needed Educated on benefits for ongoing elevation of lower extremities as directed, continue to do daily leg/foot inspections and report changes to skin integrity promptly Discussed plans with patient for ongoing care management follow up and provided patient with direct contact information for care management team  Vitamin D deficiency Interventions: (Status:  Goal on track:  Yes.)  Long Term Goal Evaluation of current treatment plan related to  Vitamin D deficiency , self-management and patient's adherence to plan as established by provider Provided education to patient about basic disease process related to Vitamin D deficiency Review of patient status, including review of consultant's reports, relevant laboratory and other test  results, and medications completed Reviewed medications with patient and discussed importance of medication adherence Educated on the effectiveness of eating a Vitamin D rich diet and getting at least 15 minutes of natural sunlight when possible  Discussed plans with patient for ongoing care management follow up and provided patient with direct contact  information for care management team  Vit D, 25-Hydroxy 30.0 - 100.0 ng/mL 33.9  34.9 CM   Pulmonary Sarcoidosis Interventions:  Goal on track:  Yes.)  Long Term Goal Evaluation of current treatment plan related to  Pulmonary Sarcoidosis , self-management and patient's adherence to plan as established by provider Review of patient status, including review of consultant's reports, relevant laboratory and other test results, and medications completed Reviewed medications with patient and discussed importance of medication adherence Determined patient continues to have her Sarcoidosis managed by Dr. Annamaria Boots with most recent follow up completed on 12/03/20 with the following recommendations reviewed:  Return in about 6 months (around 06/03/2021). Order- schedule CTa chest PE protocol with and without contrast    dx suspect PE, ILD, abnormal CT chest Order- DME [facility]- Please replace old CPAP machine, change to auto 5-15, mask of choice, humidifier, supplies, Airview/ card When you sit, try to elevate your legs to reduce fluid pressure in your calves. Follow-up with your vein and vascular doctor as planned Please call as needed    Determined patient completed pulmonary rehab with good effectiveness Determined patient verbalizes understanding of her prescribed treatment plan and when to call her doctor if needed  Discussed plans with patient for ongoing care management follow up and provided patient with direct contact information for care management team  Patient Goals/Self-Care Activities: Take all medications as prescribed Attend all scheduled provider appointments Call pharmacy for medication refills 3-7 days in advance of running out of medications Perform all self care activities independently  drink 6 to 8 glasses of water each day manage portion size  Follow Up Plan:  Telephone follow up appointment with care management team member scheduled for:  02/21/21      Plan:Telephone follow up  appointment with care management team member scheduled for:  02/21/21  Barb Merino, RN, BSN, CCM Care Management Coordinator [facility]  Direct Phone: [phone]

## 2021-01-18 ENCOUNTER — Telehealth: Payer: Self-pay

## 2021-01-18 NOTE — Progress Notes (Signed)
Chronic Care Management Pharmacy Assistant   Name: Sheila Reynolds  MRN: [id number] DOB: [birth year]   Reason for Encounter: Chart Review for CPP visit on 01/23/21 at 10:15am   Conditions to be addressed/monitored: CHF, CAD, HTN, HLD, DMII, and GERD    Recent office visits:   01-14-2021 Little, [name], RN (CCM).  12-13-2020 Maccia [name] ( [facility]) start Atorvaststin Calcium 79m oral Daily  11-14-2020 [name] NP. Glucose= 148, Albumin/Globulin= 2.3. D-Dimer= 0.96. Orders placed for Vas UKorealower extremity Venous.   11-12-2020 Little, [name] RN (CCM).   11-08-2020 [name]. Shingrix ordered. Sodium= 145. A1C= 6.3.   09-12-2020 Ghumman, Ramandeep, NP. STOP sodium chloride. START Epinephrine 0.3 mg as needed.   08-07-2020 Little, [name] RN (CCM)   08-01-2020 [name] NP. Start macrobid 100 mg twice daily for 7 weeks. Referral to urology placed. A1C= 6.1. Abnormal UA.   06-26-2020 Roman LFort Bridger YCrossnore [place] B12 injection given.   06-19-2020 [name], CMA. B12 injection given.  Recent consult visits:  12-04-2020 [name] MD (Pulmonary rehab). Follow up.   12-03-2020 [name] MD (Pulmonary). Orders placed for CT angio chest w/Cm &/ or Wo Cm.   11-29-2020 [name] MD (Pulmonary rehab). Follow up.   11-22-2020 [name] MD (Pulmonary rehab). Follow up.   11-20-2020 [name] MD (Pulmonary rehab). Follow up.   11-15-2020 SGarvin Fila MD (Neurology). STOP Atorvastatin. DECREASE Topiramate 25 mg twice daily TO 25 mg nightly. Follow up in 3 months.   11-15-2020 [name] MD (Pulmonary rehab). Follow up.   11-13-2020 [name] MD (Pulmonary rehab). Follow up.   11-08-2020 [name] MD (Pulmonary rehab). Follow up.   11-06-2020 [name] MD (Pulmonary rehab). Follow up.   11-01-2020 [name] MD (Pulmonary rehab). Follow  up.   10-30-2020 [name] MD (Pulmonary rehab). Follow up.   10-23-2020 [name] MD (Pulmonary rehab). Follow up.   10-18-2020 [name] MD (Pulmonary rehab). Follow up.   10-16-2020 [name] MD (Pulmonary rehab). Follow up.   10-08-2020 [name] MD (Pulmonary rehab). Rehab walk test.   08-23-2020 [name], [name] (Cardiology). START zetia 10 mg daily. EKG ordered. LDL= 111. Follow up in 1 year.   08-15-2020 [name] MD (Pulmonary). Referral placed for DME.   08-06-2020 Moya, [name] (Ophthalmology). Follow up.   07-24-2020 [name] (Rehab). Physical therapy.   07-19-2020 [name] (Rehab). Physical therapy.   07-18-2020 [name] (Rehab). Physical therapy.   07-16-2020 [name], PTA (Rehab). Physical therapy.   07-13-2020 [name] (Rehab). Physical therapy.   07-11-2020 [name] (Rehab). Physical therapy.   07-06-2020 [name] (Rehab). Physical therapy.   07-04-2020 [name], PTA (Rehab). Physical therapy.   07-02-2020 [name], PTA (Rehab). Physical therapy.   06-18-2020 [name] (Rehab). Physical therapy.   Hospital visits:   Medication Reconciliation was completed by comparing discharge summary, patient's EMR and Pharmacy list, and upon discussion with patient.   Admitted to the hospital on 09-03-2020 due to urticaria. Discharge date was 09-03-2020. [facility] Prednis[facility] 40 mg twice daily for 4 days   Medication Changes at Hospital Discharge: None   [facility]   Medications that remain the same after Hospital Discharge:??  -All other medications will remain the same.      Medications: Outpatient Encounter Medications as of 01/18/2021   Medication Sig   acetaminophen (TYLENOL) 500 MG tablet Take 1,000 mg by mouth 2 (two) times daily as needed for moderate pain or headache.   albuterol (PROVENTIL) (2.5 MG/3ML) 0.083% nebulizer solution Take 3 mLs (2.5 mg total) by nebulization daily as needed for wheezing or shortness of breath.   albuterol (VENTOLIN HFA) 108 (90 Base) MCG/ACT inhaler Inhale 2 puffs into the lungs every 6 (six) hours as needed for wheezing or shortness of breath.   aspirin EC 81 MG tablet Take 81 mg by mouth daily. Swallow whole.   atorvastatin (LIPITOR) 80 MG tablet Take 1 tablet (80 mg total) by mouth daily.   azelastine (ASTELIN) 0.1 % nasal spray Place 1 spray into both nostrils 2 (two) times daily. Use in each nostril as directed   Blood Glucose Monitoring Suppl (ACCU-CHEK AVIVA PLUS) w/Device KIT Use to check blood sugars 3 times a day. Dx code e11.65   brimonidine (ALPHAGAN P) 0.1 % SOLN Place 1 drop into both eyes 2 (two) times daily.    brinzolamide (AZOPT) 1 % ophthalmic suspension Place 1 drop into both eyes 3 (three) times daily.   carvedilol (COREG) 12.5 MG tablet TAKE 1 TABLET(12.5 MG) BY MOUTH TWICE DAILY   clopidogrel (PLAVIX) 75 MG tablet Take 1 tablet (75 mg total) by mouth daily.   EPINEPHrine 0.3 mg/0.3 mL IJ SOAJ injection Inject 0.3 mg into the muscle as needed for anaphylaxis.   ezetimibe (ZETIA) 10 MG tablet Take 1 tablet (10 mg total) by mouth daily.   furosemide (LASIX) 40 MG tablet Take 1 tablet (40 mg total) by mouth every other day.   isosorbide mononitrate (IMDUR) 60 MG 24 hr tablet TAKE 1 TABLET(60 MG) BY MOUTH DAILY   ketorolac (ACULAR) 0.5 % ophthalmic solution Place 1 drop into the right eye 3 (three) times daily.   Lancets (ONETOUCH DELICA PLUS [id number]) MISC CHECK BLOOD SUGAR BEFORE BREAKFAST AND DINNER   montelukast (SINGULAIR) 10 MG tablet TAKE 1 TABLET(10 MG) BY MOUTH EVERY EVENING   Nebulizers (COMPRESSOR/NEBULIZER) MISC Use as directed   nitroGLYCERIN (NITROSTAT) 0.4 MG  SL tablet Place 1 tablet (0.4 mg total) under the tongue every 5 (five) minutes as needed for chest pain. (Patient not taking: Reported on 11/08/2020)   pantoprazole (PROTONIX) 40 MG tablet Take 40 mg by mouth daily.   Polyvinyl Alcohol-Povidone PF (REFRESH) 1.4-0.6 % SOLN Place 1-2 drops into both eyes 3 (three) times daily as needed (dry/irritated eyes.).   potassium chloride SA (KLOR-CON) 20 MEQ tablet Take 1 tablet (20 mEq total) by mouth every other day.   prednisoLONE acetate (PRED FORTE) 1 % ophthalmic suspension Place 1 drop into both eyes 4 (four) times daily.    pregabalin (LYRICA) 75 MG capsule TAKE 1 CAPSULE(75 MG) BY MOUTH TWICE DAILY   sitaGLIPtin-metformin (JANUMET) 50-500 MG tablet Take 1 tablet by mouth 2 (two) times daily with a meal.   topiramate (TOPAMAX) 25 MG tablet Take 1 tablet (25 mg total) by mouth at bedtime.   Travoprost, BAK Free, (TRAVATAN) 0.004 % SOLN ophthalmic solution Place 1 drop into the right eye at bedtime.    umeclidinium-vilanterol (ANORO ELLIPTA) 62.5-25 MCG/INH AEPB Inhale 1 puff into the lungs daily.   No facility-administered encounter medications on file as of 01/18/2021.    Lab Results  Component Value Date/Time   HGBA1C 6.3 (H) 11/08/2020 03:45 PM   HGBA1C 6.1 (H) 08/01/2020 10:21 AM   MICROALBUR 30 03/28/2020 12:14 PM   MICROALBUR 10 03/23/2019 10:51 AM     BP Readings from Last 3 Encounters:  12/03/20 124/70  11/15/20 (!) 151/65  11/14/20 118/60      Have you seen any other providers since your last visit with PCP? No  Any changes in your medications or health? No  Any side effects from any medications? No  Do you have an symptoms or problems not managed by your medications? No  Any concerns about your health right now? No  Has your provider asked that you check blood pressure, blood sugar, or follow special diet at home? No  Do you get any type of exercise on a regular basis? Yes  Can you think of a goal you would like to  reach for your health? Yes  Do you have any problems getting your medications? No  Is there anything that you would like to discuss during the appointment? No   Chandani H Manheim was reminded to have all medications, supplements and any blood glucose and blood pressure readings available for review with Orlando Penner, Pharm. D, at her telephone visit on 01/23/21 at 10:15am .       Care Gaps:  Shingrix overdue PNA Vac overdue last completed 12-21-2017 Covid booster overdue last completed 11-22-2019 AWV 04-04-2021  Star Rating Drugs:  Janumet 50-500 mg- Last filled 01-12-2021 30 DS [place] Clinical Pharmacist Assistant [phone]

## 2021-01-23 ENCOUNTER — Ambulatory Visit: Payer: Medicare Other

## 2021-01-23 DIAGNOSIS — J441 Chronic obstructive pulmonary disease with (acute) exacerbation: Secondary | ICD-10-CM

## 2021-01-23 DIAGNOSIS — E1165 Type 2 diabetes mellitus with hyperglycemia: Secondary | ICD-10-CM

## 2021-01-23 DIAGNOSIS — I739 Peripheral vascular disease, unspecified: Secondary | ICD-10-CM

## 2021-01-23 MED ORDER — UMECLIDINIUM-VILANTEROL 62.5-25 MCG/ACT IN AEPB: 1.0000 | INHALATION_SPRAY | Freq: Every day | RESPIRATORY_TRACT | 5 refills | Status: DC

## 2021-01-23 NOTE — Progress Notes (Signed)
Chronic Care Management Pharmacy Note  01/29/2021 Name:  Sheila Reynolds MRN:  [id number] DOB:  [birth year]  Summary: Patient reports that she is doing well but needs refills of her Anoro Ellipta inhaler.   Recommendations/Changes made from today's visit: Recommend patient receive pneumonia vaccine. Recommend patient get refills of Anoro Ellipta inhaler because it is her maintenance inhaler.   Plan: Patient to receive pneumonia vaccine during next office visit in January when she is most comfortable. PCP team sent in refills of patients Anoro Ellipta inhaler to [facility].    Subjective: Sheila Reynolds is an 75 y.o. year old female who is a primary patient of Glendale Chard, MD.  The CCM team was consulted for assistance with disease management and care coordination needs.    Engaged with patient by telephone for initial visit in response to provider referral for pharmacy case management and/or care coordination services. She graduated from [employer]. She is married, she has two children and a cousin that she raised. She was her cousins daughter and she moved to [facility] and went [employer]. Her son is an Animal nutritionist, and her other daughter went to [facility] [employer], and then got her Masters at [employer]. Her husband went to [employer]. She reports that her breathing stops her from doing things that she likes to do. 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 She is usually home most of the time. The child that she raised her mother is being funeralized on Friday so she is traveling back and forth. She is excited that she gets to go home and see family but it is still a sad occasion. She grew up in a family where she had vegetables for lunch and a dessert and meat was only for dinner. She eats a lot of vegetables. She becomes worried about her stamina and her balance.  Patient reports that she wants to be here as long as she can.   Consent to Services:  The patient was given the following information about Chronic Care Management services today, agreed to services, and gave verbal consent: 1. CCM service includes personalized support from designated clinical staff supervised by the primary care provider, including individualized plan of care and coordination with other care providers 2. 24/7 contact phone numbers for assistance for urgent and routine care needs. 3. Service will only be billed when office clinical staff spend 20 minutes or more in a month to coordinate care. 4. Only one practitioner may furnish and bill the service in a calendar month. 5.The patient may stop CCM services at any time (effective at the end of the month) by phone call to the office staff. 6. The patient will be responsible for cost sharing (co-pay) of up to 20% of the service fee (after annual deductible is met). Patient agreed to services and consent obtained.  Patient Care Team: Glendale Chard, MD as PCP - General (Internal Medicine) [name], Wonda Cheng, MD as PCP - Cardiology (Cardiology) [name], Wonda Cheng, MD as Consulting Physician (Cardiology) Rex Kras [name], RN as Case Manager Mayford Knife, [facility] (Pharmacist)  Recent office visits:    01-14-2021 Lynne Logan, RN (CCM).   12-13-2020 Maccia [name] ( [facility]) start Atorvaststin Calcium 75m oral Daily   11-14-2020 [name] NP. Glucose= 148, Albumin/Globulin= 2.3. D-Dimer= 0.96. Orders placed for Vas UKorealower extremity Venous.  11-12-2020 Little, [name], RN (CCM).   11-08-2020 Glendale Chard, MD. Shingrix ordered. Sodium= 145. A1C= 6.3.   09-12-2020 Ghumman, Ramandeep, NP. STOP sodium chloride. START Epinephrine 0.3 mg as needed.   08-07-2020 Little, [name], RN (CCM)   08-01-2020 Bary Castilla, NP. Start macrobid 100 mg twice daily for 7 weeks. Referral to urology placed. A1C= 6.1.  Abnormal UA.   06-26-2020 Roman [place], [place], [place]. B12 injection given.   06-19-2020 Azalee Course T, CMA. B12 injection given.   Recent consult visits:  12-04-2020 Deneise Lever, MD (Pulmonary rehab). Follow up.   12-03-2020 Deneise Lever, MD (Pulmonary). Orders placed for CT angio chest w/Cm &/ or Wo Cm.   11-29-2020 Deneise Lever, MD (Pulmonary rehab). Follow up.   11-22-2020 Deneise Lever, MD (Pulmonary rehab). Follow up.   11-20-2020 Deneise Lever, MD (Pulmonary rehab). Follow up.   11-15-2020 Garvin Fila, MD (Neurology). STOP Atorvastatin. DECREASE Topiramate 25 mg twice daily TO 25 mg nightly. Follow up in 3 months.   11-15-2020 Deneise Lever, MD (Pulmonary rehab). Follow up.   11-13-2020 Deneise Lever, MD (Pulmonary rehab). Follow up.   11-08-2020 Deneise Lever, MD (Pulmonary rehab). Follow up.   11-06-2020 Deneise Lever, MD (Pulmonary rehab). Follow up.   11-01-2020 Deneise Lever, MD (Pulmonary rehab). Follow up.   10-30-2020 Deneise Lever, MD (Pulmonary rehab). Follow up.   10-23-2020 Deneise Lever, MD (Pulmonary rehab). Follow up.   10-18-2020 Deneise Lever, MD (Pulmonary rehab). Follow up.   10-16-2020 Deneise Lever, MD (Pulmonary rehab). Follow up.   10-08-2020 Deneise Lever, MD (Pulmonary rehab). Rehab walk test.   08-23-2020 [name], Wonda Cheng, MD (Cardiology). START zetia 10 mg daily. EKG ordered. LDL= 111. Follow up in 1 year.   08-15-2020 Deneise Lever, MD (Pulmonary). Referral placed for DME.   08-06-2020 Moya, Carlisle Beers, MD (Ophthalmology). Follow up.   07-24-2020 Leeroy Cha, PT (Rehab). Physical therapy.   07-19-2020 Leeroy Cha, PT (Rehab). Physical therapy.   07-18-2020 Susy Frizzle, PTA (Rehab). Physical therapy.   07-16-2020 Roseanne Reno B, PTA (Rehab). Physical therapy.   07-13-2020 Susy Frizzle, PTA (Rehab). Physical therapy.   07-11-2020 Leeroy Cha, PT (Rehab). Physical therapy.   07-06-2020 Susy Frizzle, PTA (Rehab). Physical therapy.   07-04-2020 Roseanne Reno B, PTA (Rehab). Physical therapy.   07-02-2020 Roseanne Reno B, PTA (Rehab). Physical therapy.   06-18-2020 Leeroy Cha, PT (Rehab). Physical therapy.     Hospital visits:    Medication Reconciliation was completed by comparing discharge summary, patients EMR and Pharmacy list, and upon discussion with patient.   Admitted to the hospital on 09-03-2020 due to urticaria. Discharge date was 09-03-2020. Discharged from [place]?Medications Started at [facility] Discharge:?? Prednisone 40 mg twice daily for 4 days   Medication Changes at Hospital Discharge: None   Medications Discontinued at Hospital Discharge: None   Medications that remain the same after Hospital Discharge:??  -All other medications will remain the same.     Objective:  Lab Results  Component Value Date   CREATININE 0.88 11/14/2020   BUN 11 11/14/2020   GFR 97.01 08/18/2016   GFRNONAA 79 03/28/2020   GFRAA 91 03/28/2020   NA 142 11/14/2020   K 4.4 11/14/2020   CALCIUM 9.8 11/14/2020   CO2 26 11/14/2020   GLUCOSE 148 (H) 11/14/2020    Lab Results  Component Value Date/Time   HGBA1C 6.3 (H) 11/08/2020 03:45 PM   HGBA1C 6.1 (H) 08/01/2020 10:21 AM   GFR 97.01 08/18/2016 10:12 AM   GFR 83.22 09/17/2015 02:30 PM   MICROALBUR 30 03/28/2020 12:14 PM   MICROALBUR 10 03/23/2019 10:51 AM    Last diabetic Eye exam:  Lab Results  Component Value Date/Time   HMDIABEYEEXA No Retinopathy 10/11/2020 12:00 AM    Last diabetic Foot exam: No results found for: HMDIABFOOTEX   Lab Results  Component Value Date   CHOL 177 12/05/2020   HDL 45 12/05/2020   LDLCALC 111 (H) 12/05/2020   TRIG 118 12/05/2020   CHOLHDL 3.9 12/05/2020    Hepatic Function Latest Ref Rng & Units 12/05/2020 11/14/2020 08/01/2020  Total Protein 6.0 - 8.5 g/dL - 6.6  6.4  Albumin 3.7 - 4.7 g/dL - 4.6 4.2  AST 0 - 40 IU/L - 17 13  ALT 0 - 32 IU/L 17 15 13   Alk Phosphatase 44 - 121 IU/L - 115 111  Total Bilirubin 0.0 - 1.2 mg/dL - 0.4 0.3  Bilirubin, Direct 0.00 - 0.40 mg/dL - - -    Lab Results  Component Value Date/Time   TSH 0.943 04/03/2020 01:37 PM   TSH 1.130 11/28/2019 02:36 PM    CBC Latest Ref Rng & Units 11/14/2020 08/01/2020 04/30/2020  WBC 3.4 - 10.8 x10E3/uL 6.7 6.8 7.8  Hemoglobin 11.1 - 15.9 g/dL 11.6 11.3 11.2  Hematocrit 34.0 - 46.6 % 35.9 34.4 35.6  Platelets 150 - 450 x10E3/uL 206 179 211    Lab Results  Component Value Date/Time   VD25OH 33.9 11/08/2020 03:45 PM   VD25OH 34.9 10/07/2018 12:57 PM    Clinical ASCVD: Yes    Depression screen [facility] 2/9 12/13/2020 10/08/2020 03/28/2020  Decreased Interest 0 0 0  Down, Depressed, Hopeless 0 1 0  PHQ - 2 Score 0 1 0  Altered sleeping 1 1 -  Tired, decreased energy 1 1 -  Change in appetite 0 0 -  Feeling bad or failure about yourself  0 0 -  Trouble concentrating 1 0 -  Moving slowly or fidgety/restless 0 0 -  Suicidal thoughts 0 0 -  PHQ-9 Score 3 - -  Difficult doing work/chores - - -  Some recent data might be hidden      Social History   Tobacco Use  Smoking Status Former   Packs/day: 1.00   Years: 4.00   Pack years: 4.00   Types: Cigarettes   Quit date: 02/11/1980   Years since quitting: 40.9  Smokeless Tobacco Never   BP Readings from Last 3 Encounters:  12/03/20 124/70  11/15/20 (!) 151/65  11/14/20 118/60   Pulse Readings from Last 3 Encounters:  12/03/20 78  11/15/20 77  11/14/20 87   Wt Readings from Last 3 Encounters:  12/11/20 175 lb 4.3 oz (79.5 kg)  12/03/20 176 lb 3.2 oz (79.9 kg)  11/22/20 175 lb 4.3 oz (79.5 kg)   BMI Readings from Last 3 Encounters:  12/11/20 32.06 kg/m  12/03/20 32.23 kg/m  11/22/20 32.06 kg/m    Assessment/Interventions: Review of patient past medical history, allergies, medications, health status, including  review of consultants reports, laboratory and other test data, was performed as part of comprehensive evaluation and provision of chronic care management services.   SDOH:  (Social Determinants of Health) assessments and interventions performed: Yes  SDOH Screenings   Alcohol Screen: Not on file  Depression (FXO3-2): Low Risk  PHQ-2 Score: 3  Financial Resource Strain: Medium Risk   Difficulty of Paying Living Expenses: Somewhat hard  Food Insecurity: No Food Insecurity   Worried About Charity fundraiser in the Last Year: Never true   Ran Out of Food in the Last Year: Never true  Housing: Not on file  Physical Activity: Inactive   Days of Exercise per Week: 0 days   Minutes of Exercise per Session: 0 min  Social Connections: Not on file  Stress: Stress Concern Present   Feeling of Stress : To some extent  Tobacco Use: Medium Risk   Smoking Tobacco Use: Former   Smokeless Tobacco Use: Never   Passive Exposure: Not on file  Transportation Needs: No Transportation Needs   Lack of Transportation (Medical): No   Lack of Transportation (Non-Medical): No    CCM Care Plan  Allergies  Allergen Reactions   Crestor [Rosuvastatin Calcium] Other (See Comments)    muscle aches   Demerol  [Meperidine Hcl]     Other reaction(s): Hallucinations   Shellfish Allergy Itching and Other (See Comments)    Crab, shrimp and lobster ---lips itch and tingle Was told not to eat again after having a allergy test. Lobster, crab and shrimp     Medications Reviewed Today     Reviewed by Mayford Knife, RPH (Pharmacist) on 01/23/21 at [place] List Status: <None>   Medication Order Taking? Sig Documenting Provider Last Dose Status Informant  acetaminophen (TYLENOL) 500 MG tablet [id number] Yes Take 1,000 mg by mouth 2 (two) times daily as needed for moderate pain or headache. [provider] Taking Active Self  albuterol (PROVENTIL) (2.5 MG/3ML) 0.083% nebulizer solution [id number] Yes  Take 3 mLs (2.5 mg total) by nebulization daily as needed for wheezing or shortness of breath. Deneise Lever, MD Taking Active Self           Med Note Jeoffrey Massed Jan 23, 2021 10:29 AM) She is not using everyday because it can give her the jitters  albuterol (VENTOLIN HFA) 108 (90 Base) MCG/ACT inhaler [id number] Yes Inhale 2 puffs into the lungs every 6 (six) hours as needed for wheezing or shortness of breath. Baird Lyons D, MD Taking Active Self  aspirin EC 81 MG tablet [id number] Yes Take 81 mg by mouth daily. Swallow whole. [provider] Taking Active Self           Med Note [facility] MENDEZ, CARLOS A   Thu Apr 19, 2020 11:14 AM)    atorvastatin (LIPITOR) 80 MG tablet [id number] Yes Take 1 tablet (80 mg total) by mouth daily. [name], Wonda Cheng, MD Taking Active   azelastine (ASTELIN) 0.1 % nasal spray [id number] Yes Place 1 spray into both nostrils 2 (two) times daily. Use in each nostril as directed Glendale Chard, MD  Active   Blood Glucose Monitoring Suppl (ACCU-CHEK AVIVA PLUS) w/Device KIT [id number] Yes Use to check blood sugars 3 times a day. Dx code H43.88 Glendale Chard, MD  Active   brimonidine (ALPHAGAN P) 0.1 % SOLN [id number] Yes Place 1 drop into both eyes 2 (two) times daily.  [provider]  Active Self  brinzolamide (AZOPT) 1 % ophthalmic suspension [id number] Yes Place 1 drop into both eyes 3 (three) times daily. [provider]  Active Self  carvedilol (COREG) 12.5 MG tablet [id number] Yes TAKE 1 TABLET(12.5 MG) BY MOUTH TWICE DAILY [name], Wonda Cheng, MD  Active   clopidogrel (PLAVIX) 75 MG  tablet [id number] Yes Take 1 tablet (75 mg total) by mouth daily. [name], Wonda Cheng, MD  Active   EPINEPHrine 0.3 mg/0.3 mL IJ SOAJ injection [id number] Yes Inject 0.3 mg into the muscle as needed for anaphylaxis. Bary Castilla, NP  Active   ezetimibe (ZETIA) 10 MG tablet [id number] Yes Take 1 tablet (10 mg total) by mouth daily. Glendale Chard, MD  Taking Active   furosemide (LASIX) 40 MG tablet [id number] Yes Take 1 tablet (40 mg total) by mouth every other day. [name], Wonda Cheng, MD Taking Active            Med Note Jeoffrey Massed Jan 23, 2021 10:38 AM) Taking as needed  isosorbide mononitrate (IMDUR) 60 MG 24 hr tablet [id number] Yes TAKE 1 TABLET(60 MG) BY MOUTH DAILY [name], Wonda Cheng, MD Taking Active   ketorolac (ACULAR) 0.5 % ophthalmic solution [id number] Yes Place 1 drop into the right eye 3 (three) times daily. [provider] Taking Active   Lancets [facility] Donaciano Eva PLUS [id number]) [place] [id number] Yes CHECK BLOOD SUGAR BEFORE BREAKFAST AND Arlis Porta, Ramandeep, NP Taking Active   montelukast (SINGULAIR) 10 MG tablet [id number] Yes TAKE 1 TABLET(10 MG) BY MOUTH EVERY Fredric Mare, MD  Active   Nebulizers (COMPRESSOR/NEBULIZER) MISC [id number] Yes Use as directed Deneise Lever, MD Taking Active Self  nitroGLYCERIN (NITROSTAT) 0.4 MG SL tablet [id number] Yes Place 1 tablet (0.4 mg total) under the tongue every 5 (five) minutes as needed for chest pain. Glendale Chard, MD  Active   pantoprazole (PROTONIX) 40 MG tablet [id number] Yes Take 40 mg by mouth daily. [provider] Taking Active   Polyvinyl Alcohol-Povidone PF (REFRESH) 1.4-0.6 % SOLN [id number] Yes Place 1-2 drops into both eyes 3 (three) times daily as needed (dry/irritated eyes.). [provider] Taking Active Self  potassium chloride SA (KLOR-CON) 20 MEQ tablet [id number] Yes Take 1 tablet (20 mEq total) by mouth every other day. [name], Wonda Cheng, MD Taking Active   prednisoLONE acetate (PRED FORTE) 1 % ophthalmic suspension [id number] Yes Place 1 drop into both eyes 4 (four) times daily.  [provider] Taking Active Self  pregabalin (LYRICA) 75 MG capsule [id number] Yes TAKE 1 CAPSULE(75 MG) BY MOUTH TWICE DAILY Glendale Chard, MD Taking Active   sitaGLIPtin-metformin (JANUMET) 50-500 MG tablet [id number] Yes Take 1 tablet  by mouth 2 (two) times daily with a meal. Ghumman, Ramandeep, NP Taking Active   topiramate (TOPAMAX) 25 MG tablet [id number] Yes Take 1 tablet (25 mg total) by mouth at bedtime. Garvin Fila, MD  Active   Travoprost, BAK Free, (TRAVATAN) 0.004 % SOLN ophthalmic solution [id number] Yes Place 1 drop into the right eye at bedtime.  [provider]  Active Self  umeclidinium-vilanterol (ANORO ELLIPTA) 62.5-25 MCG/INH AEPB [id number] Yes Inhale 1 puff into the lungs daily. 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) ([place]) 04/13/2020   Chronic diastolic heart failure ([place]) 03/28/2020   Uncontrolled type 2 diabetes mellitus with hyperglycemia ([place]) 07/25/2019   Hiatal hernia 07/25/2019   Spontaneous ecchymoses 07/25/2019   Class 1 obesity due to excess calories with serious comorbidity and body mass index (BMI) of 32.0 to 32.9 in adult 07/25/2019  Peripheral vascular disease, unspecified ([place]) 03/23/2019   Uveitis 06/01/2017   Fibromyalgia 06/01/2017   DDD (degenerative disc disease), lumbar 06/01/2017   DDD (degenerative disc disease), thoracic 06/01/2017   History of hypercholesterolemia 06/01/2017   History of anxiety 06/01/2017   History of IBS 06/01/2017   Premature atrial contractions 03/30/2017   PVC's (premature ventricular contractions) 03/30/2017   Diabetes mellitus without complication ([place]) 38/18/2993   Angina pectoris ([place]) 01/15/2017   CAD (coronary artery disease) 01/12/2017   Shortness of breath 12/12/2016   Acute maxillary sinusitis 05/03/2014   Obstructive sleep apnea 11/04/2013   Multiple lung nodules 07/18/2012   Heart palpitations 03/11/2012   HTN (hypertension) 03/11/2012   Gastroesophageal reflux disease without  esophagitis 07/13/2011   Sarcoidosis 02/08/2011   COPD with acute exacerbation ([place]) 02/05/2011   Hyperlipemia 09/24/2010   Atypical chest pain 09/24/2010   NSVT (nonsustained ventricular tachycardia) ([place]) 09/24/2010   Leg edema 09/24/2010    Immunization History  Administered Date(s) Administered   Fluad Quad(high Dose 65+) 12/30/2019, 11/08/2020   Influenza Split 10/31/2013   Influenza Whole 12/23/2017   Influenza, High Dose Seasonal PF 11/18/2016, 12/21/2017, 11/23/2018   Influenza,inj,Quad PF,6+ Mos 02/25/2013, 03/19/2015   Influenza,inj,quad, With Preservative 11/11/2018   PFIZER(Purple Top)SARS-COV-2 Vaccination 03/01/2019, 03/22/2019, 11/22/2019   Pneumococcal Polysaccharide-23 12/21/2017   Zoster Recombinat (Shingrix) 12/25/2017    Conditions to be addressed/monitored:  Hyperlipidemia, Diabetes, and Coronary Artery Disease  Care Plan : [place]  Updates made by Mayford Knife, RPH since 01/29/2021 12:00 AM     Problem: DM, PVD, COPD      Long-Range Goal: Disease Management   Start Date: 01/23/2021  This Visit's Progress: On track  Priority: High  Note:     Current Barriers:  Unable to independently monitor therapeutic efficacy Unable to self administer medications as prescribed Does not adhere to prescribed medication regimen  Pharmacist Clinical Goal(s):  Patient will verbalize ability to afford treatment regimen achieve adherence to monitoring guidelines and medication adherence to achieve therapeutic efficacy through collaboration with PharmD and provider.   Interventions: 1:1 collaboration with Glendale Chard, MD regarding development and update of comprehensive plan of care as evidenced by provider attestation and co-signature Inter-disciplinary care team collaboration (see longitudinal plan of care) Comprehensive medication review performed; medication list updated in electronic medical record  Diabetes (A1c goal  <7%) -Controlled -Current medications: Janumet 50-500 mg tablet - taking 1 tablet by mouth two times a day with meals  Patient reports the cost is over $130  -Current home glucose readings fasting glucose: low 100's, 12/14-123  -Denies hypoglycemic/hyperglycemic symptoms -Current meal patterns:  breakfast: a cup of water every morning, toast, apple sauce   lunch: eat mostly vegetables a salad with different vegetables , sometimes pizza, sandwiches  dinner: she has tuna salad casserole, with green peas, or vegetables and chicken, cubed steak, mashed potatoes and green beans. On fridays they normally have baked potato, salad and whiting. Sometimes they cook it at home, and other times they go to [employer].  snacks: patient reports that she is not eating a lot of food  drinks: she is drinking water,  -Current exercise: she was doing water aerobics five days a week  -Educated on A1c and blood sugar goals; Counseled to check feet daily and get yearly eye exams -Counseled to check feet daily and get yearly eye exams -Counseled on diet and exercise extensively   Peripheral Vascular Disease: (LDL goal < 70) -Uncontrolled -Current treatment: Atorvastatin 80 mg  tablet once per day at night Ezetimibe 10 mg tablet once per day in the morning  -Previous Medications: Rosuvastatin  -Current dietary patterns: she is using an air fryer frequently, she is eating plenty of vegetables and salad. The salads have fruit, cucumber, celery and broccoli, onion sometimes bacon.  -She reports she loves hot dogs with fries  -Current exercise habits: She is walking, up and down the stairs she is doing the pulmonary exercises at home  -Educated on Cholesterol goals;  Benefits of statin for ASCVD risk reduction; Importance of limiting foods high in cholesterol; -Counseled on diet and exercise extensively Recommended to continue current medication  COPD (Goal: control symptoms and prevent  exacerbations) -Controlled -Current treatment  Singulair 10 mg tablet once per day  ProAir HFA- use one puff every 6 hours  Anoro Ellipta 62.5- 25 MCH/INH - 1 puff daily  Patient reports that she does not have any because she needs a refill  -Gold Grade: Gold 2 (FEV1 50-79%) -Pulmonary function testing:  PFT: 01/07/2011-FEV1 1.59/79%, FEV1/FVC 0.73, FEF 25-75% improved to 38% with bronchodilator. TLC 90% DLCO 55% Office Spirometry 06/09/16- moderate restriction of exhaled volume. FVC 1.51/68%, FEV1 1.20/70%, ratio 0.79, FEF 25-75% 1.19/73% Nuclear Stress Test 09/04/16- EF 65%, low risk. ECHO 07/31/16- Gr 1 DD Walk test on room air 12/21/2017-minimal saturation 94%, maximum heart rate 118/minute with no stops walking 3 laps x180 m. Office Spirometry 12/21/17 PFT 04/13/20- Minimal Obstruction and Minimal Restriction, no resp to BD, Moderately severe DLCO defect WALK TEST 4/422- 2 laps, min O2 sat 94%, max HR 127.  -Exacerbations requiring treatment in last 6 months: patient reports that there have not been any exacerbations that required treatment in the last 6 months -Patient reports consistent use of maintenance inhaler -She reports waiting for refills of her Anoro inhaler from Dr. Annamaria Boots - Will check to see if there are samples of Anoro inhaler available for patient, and request refills from Dr. Annamaria Boots  -Frequency of rescue inhaler use: she has had to use it 2 times in the past 6 weeks, she tries to calm her breathing herself.  -She gets a feeling when she feels like she needs to use the inhaler but  -She was doing pulmonary classes twice per week on Tuesdays and Thursdays. She has exercises that she does from the pulmonary class -Counseled on [place] inhaler technique; When to use rescue inhaler -Recommended to continue current medication  Health Maintenance -Vaccine gaps: COVID-19 Booster, Pneumonia Vaccine   -Pneumonia vaccine: patient to receive in office during next visit on  02/13/2021  -Confirmed patient received COVID-19 booster on 12/28/2020 via [place] registry   -Will have PCP team update patients chart to reflect vaccine administration -Counseled on the importance of vaccinations and patient is in agreement.   Patient Goals/Self-Care Activities Patient will:  - take medications as prescribed as evidenced by patient report and record review  Follow Up Plan: The patient has been provided with contact information for the care management team and has been advised to call with any health related questions or concerns.        Medication Assistance: Application for Lyrica, Janumet, and Anoro Ellipta  medication assistance program. in process.  Anticipated assistance start date 02/2021.  See plan of care for additional detail. Patient reports filling out an application previously but making too much money, will try again.   Compliance/Adherence/Medication fill history: Care Gaps: Shingrix Vaccine - last dose 12/25/2017, will discuss during next visit  Pneumonia Vaccine - she has  not had it  will get 02/14/2020  COVID-19 Vaccine booster - two weeks ago, 12/28/2020 Pfizer  [facility]: Atorvastatin 80 mg tablet  Janumet 50-500 mg tablet  Patient's preferred pharmacy is:  [id number] [address], [place] [address] Phone: [phone] Fax: [facility] [address], [place] [address] [place] [address] Phone: [phone] Fax: [phone]  Uses pill box? Yes Pt endorses 95% compliance  We discussed: Benefits of medication synchronization, packaging and delivery as well as enhanced pharmacist oversight with [employer]. Patient decided to: Continue current medication management strategy  Care Plan and Follow Up Patient Decision:  Patient agrees to Care Plan and Follow-up.  Plan: The patient  has been provided with contact information for the care management team and has been advised to call with any health related questions or concerns.   Orlando Penner, CPP, PharmD Clinical Pharmacist Practitioner [facility] [phone]

## 2021-01-24 ENCOUNTER — Telehealth: Payer: Self-pay

## 2021-01-24 NOTE — Chronic Care Management (AMB) (Signed)
Chronic Care Management Pharmacy Assistant   Name: Sheila Reynolds  MRN: [id number] DOB: 06/02/1945   Reason for Encounter: PAP   Medications: Outpatient Encounter Medications as of 01/24/2021  Medication Sig Note   acetaminophen (TYLENOL) 500 MG tablet Take 1,000 mg by mouth 2 (two) times daily as needed for moderate pain or headache.    albuterol (PROVENTIL) (2.5 MG/3ML) 0.083% nebulizer solution Take 3 mLs (2.5 mg total) by nebulization daily as needed for wheezing or shortness of breath. 01/23/2021: She is not using everyday because it can give her the jitters   albuterol (VENTOLIN HFA) 108 (90 Base) MCG/ACT inhaler Inhale 2 puffs into the lungs every 6 (six) hours as needed for wheezing or shortness of breath.    aspirin EC 81 MG tablet Take 81 mg by mouth daily. Swallow whole.    atorvastatin (LIPITOR) 80 MG tablet Take 1 tablet (80 mg total) by mouth daily.    azelastine (ASTELIN) 0.1 % nasal spray Place 1 spray into both nostrils 2 (two) times daily. Use in each nostril as directed    Blood Glucose Monitoring Suppl (ACCU-CHEK AVIVA PLUS) w/Device KIT Use to check blood sugars 3 times a day. Dx code e11.65    brimonidine (ALPHAGAN P) 0.1 % SOLN Place 1 drop into both eyes 2 (two) times daily.     brinzolamide (AZOPT) 1 % ophthalmic suspension Place 1 drop into both eyes 3 (three) times daily.    carvedilol (COREG) 12.5 MG tablet TAKE 1 TABLET(12.5 MG) BY MOUTH TWICE DAILY    clopidogrel (PLAVIX) 75 MG tablet Take 1 tablet (75 mg total) by mouth daily.    EPINEPHrine 0.3 mg/0.3 mL IJ SOAJ injection Inject 0.3 mg into the muscle as needed for anaphylaxis.    ezetimibe (ZETIA) 10 MG tablet Take 1 tablet (10 mg total) by mouth daily.    furosemide (LASIX) 40 MG tablet Take 1 tablet (40 mg total) by mouth every other day. 01/23/2021: Taking as needed   isosorbide mononitrate (IMDUR) 60 MG 24 hr tablet TAKE 1 TABLET(60 MG) BY MOUTH DAILY    ketorolac (ACULAR) 0.5 % ophthalmic  solution Place 1 drop into the right eye 3 (three) times daily.    Lancets (ONETOUCH DELICA PLUS [id number]) MISC CHECK BLOOD SUGAR BEFORE BREAKFAST AND DINNER    montelukast (SINGULAIR) 10 MG tablet TAKE 1 TABLET(10 MG) BY MOUTH EVERY EVENING    Nebulizers (COMPRESSOR/NEBULIZER) MISC Use as directed    nitroGLYCERIN (NITROSTAT) 0.4 MG SL tablet Place 1 tablet (0.4 mg total) under the tongue every 5 (five) minutes as needed for chest pain.    pantoprazole (PROTONIX) 40 MG tablet Take 40 mg by mouth daily.    Polyvinyl Alcohol-Povidone PF (REFRESH) 1.4-0.6 % SOLN Place 1-2 drops into both eyes 3 (three) times daily as needed (dry/irritated eyes.).    potassium chloride SA (KLOR-CON) 20 MEQ tablet Take 1 tablet (20 mEq total) by mouth every other day.    prednisoLONE acetate (PRED FORTE) 1 % ophthalmic suspension Place 1 drop into both eyes 4 (four) times daily.     pregabalin (LYRICA) 75 MG capsule TAKE 1 CAPSULE(75 MG) BY MOUTH TWICE DAILY    sitaGLIPtin-metformin (JANUMET) 50-500 MG tablet Take 1 tablet by mouth 2 (two) times daily with a meal.    topiramate (TOPAMAX) 25 MG tablet Take 1 tablet (25 mg total) by mouth at bedtime.    Travoprost, BAK Free, (TRAVATAN) 0.004 % SOLN ophthalmic solution Place 1 drop into the  right eye at bedtime.     umeclidinium-vilanterol (ANORO ELLIPTA) 62.5-25 MCG/ACT AEPB Inhale 1 puff into the lungs daily at 6 (six) AM.    No facility-administered encounter medications on file as of 01/24/2021.   01-24-2021: Started patient assistance application for janumet, Anoro Ellipta. Waiting on assistance with finding patient assistance for lyrica.  [facility] Pharmacist Assistant [phone]

## 2021-01-25 ENCOUNTER — Telehealth: Payer: Self-pay

## 2021-01-25 NOTE — Chronic Care Management (AMB) (Signed)
Chronic Care Management Pharmacy Assistant   Name: Sheila Reynolds  MRN: [id number] DOB: September 12, 1945  Reason for Encounter: Patient assistance   Medications: Outpatient Encounter Medications as of 01/25/2021  Medication Sig Note   acetaminophen (TYLENOL) 500 MG tablet Take 1,000 mg by mouth 2 (two) times daily as needed for moderate pain or headache.    albuterol (PROVENTIL) (2.5 MG/3ML) 0.083% nebulizer solution Take 3 mLs (2.5 mg total) by nebulization daily as needed for wheezing or shortness of breath. 01/23/2021: She is not using everyday because it can give her the jitters   albuterol (VENTOLIN HFA) 108 (90 Base) MCG/ACT inhaler Inhale 2 puffs into the lungs every 6 (six) hours as needed for wheezing or shortness of breath.    aspirin EC 81 MG tablet Take 81 mg by mouth daily. Swallow whole.    atorvastatin (LIPITOR) 80 MG tablet Take 1 tablet (80 mg total) by mouth daily.    azelastine (ASTELIN) 0.1 % nasal spray Place 1 spray into both nostrils 2 (two) times daily. Use in each nostril as directed    Blood Glucose Monitoring Suppl (ACCU-CHEK AVIVA PLUS) w/Device KIT Use to check blood sugars 3 times a day. Dx code e11.65    brimonidine (ALPHAGAN P) 0.1 % SOLN Place 1 drop into both eyes 2 (two) times daily.     brinzolamide (AZOPT) 1 % ophthalmic suspension Place 1 drop into both eyes 3 (three) times daily.    carvedilol (COREG) 12.5 MG tablet TAKE 1 TABLET(12.5 MG) BY MOUTH TWICE DAILY    clopidogrel (PLAVIX) 75 MG tablet Take 1 tablet (75 mg total) by mouth daily.    EPINEPHrine 0.3 mg/0.3 mL IJ SOAJ injection Inject 0.3 mg into the muscle as needed for anaphylaxis.    ezetimibe (ZETIA) 10 MG tablet Take 1 tablet (10 mg total) by mouth daily.    furosemide (LASIX) 40 MG tablet Take 1 tablet (40 mg total) by mouth every other day. 01/23/2021: Taking as needed   isosorbide mononitrate (IMDUR) 60 MG 24 hr tablet TAKE 1 TABLET(60 MG) BY MOUTH DAILY    ketorolac (ACULAR) 0.5 %  ophthalmic solution Place 1 drop into the right eye 3 (three) times daily.    Lancets (ONETOUCH DELICA PLUS [id number]) MISC CHECK BLOOD SUGAR BEFORE BREAKFAST AND DINNER    montelukast (SINGULAIR) 10 MG tablet TAKE 1 TABLET(10 MG) BY MOUTH EVERY EVENING    Nebulizers (COMPRESSOR/NEBULIZER) MISC Use as directed    nitroGLYCERIN (NITROSTAT) 0.4 MG SL tablet Place 1 tablet (0.4 mg total) under the tongue every 5 (five) minutes as needed for chest pain.    pantoprazole (PROTONIX) 40 MG tablet Take 40 mg by mouth daily.    Polyvinyl Alcohol-Povidone PF (REFRESH) 1.4-0.6 % SOLN Place 1-2 drops into both eyes 3 (three) times daily as needed (dry/irritated eyes.).    potassium chloride SA (KLOR-CON) 20 MEQ tablet Take 1 tablet (20 mEq total) by mouth every other day.    prednisoLONE acetate (PRED FORTE) 1 % ophthalmic suspension Place 1 drop into both eyes 4 (four) times daily.     pregabalin (LYRICA) 75 MG capsule TAKE 1 CAPSULE(75 MG) BY MOUTH TWICE DAILY    sitaGLIPtin-metformin (JANUMET) 50-500 MG tablet Take 1 tablet by mouth 2 (two) times daily with a meal.    topiramate (TOPAMAX) 25 MG tablet Take 1 tablet (25 mg total) by mouth at bedtime.    Travoprost, BAK Free, (TRAVATAN) 0.004 % SOLN ophthalmic solution Place 1 drop into the  right eye at bedtime.     umeclidinium-vilanterol (ANORO ELLIPTA) 62.5-25 MCG/ACT AEPB Inhale 1 puff into the lungs daily at 6 (six) AM.    No facility-administered encounter medications on file as of 01/25/2021.    01-25-2021: Patient states she is in need of patient assistance for lyrica. No patient assistance found for medication. Started a prior authorization through covermymeds and medication is covered through [employer] on a tier 3. Contacted patient to inform her and options of switching to a lower tier medication or changing to a lower dose quantity. Patient stated medication isn't expensive but copay may change depending on when she gets it. Patient  doesn't want to change medications or dose because it has been working great for her. Patient stated she is ok with no assistance for medication.  [employer] Pharmacist Assistant [phone]

## 2021-01-29 NOTE — Patient Instructions (Signed)
Visit Information It was great speaking with you today!  Please let me know if you have any questions about our visit.   Goals Addressed             This Visit's Progress    Manage Fatigue (Tiredness-COPD)       Follow Up Date 04/05/2021    - eat healthy - get at least 7 to 8 hours of sleep at night - get outdoors every day (weather permitting) - limit daytime naps - practice relaxation or meditation daily    Why is this important?   Feeling tired or worn out is a common symptom of COPD (chronic obstructive pulmonary disease).  Learning when you feel your best and when you need rest is important.  Managing the tiredness (fatigue) will help you be active and enjoy life.     Notes:  Please let me know if you have any questions        Patient Care Plan: CCM Pharmacy Care Plan     Problem Identified: DM, PVD, COPD      Long-Range Goal: Disease Management   Start Date: 01/23/2021  This Visit's Progress: On track  Priority: High  Note:     Current Barriers:  Unable to independently monitor therapeutic efficacy Unable to self administer medications as prescribed Does not adhere to prescribed medication regimen  Pharmacist Clinical Goal(s):  Patient will verbalize ability to afford treatment regimen achieve adherence to monitoring guidelines and medication adherence to achieve therapeutic efficacy through collaboration with PharmD and provider.   Interventions: 1:1 collaboration with Glendale Chard, MD regarding development and update of comprehensive plan of care as evidenced by provider attestation and co-signature Inter-disciplinary care team collaboration (see longitudinal plan of care) Comprehensive medication review performed; medication list updated in electronic medical record  Diabetes (A1c goal <7%) -Controlled -Current medications: Janumet 50-500 mg tablet - taking 1 tablet by mouth two times a day with meals  Patient reports the cost is over $130   -Current home glucose readings fasting glucose: low 100's, 12/14-123  -Denies hypoglycemic/hyperglycemic symptoms -Current meal patterns:  breakfast: a cup of water every morning, toast, apple sauce   lunch: eat mostly vegetables a salad with different vegetables , sometimes pizza, sandwiches  dinner: she has tuna salad casserole, with green peas, or vegetables and chicken, cubed steak, mashed potatoes and green beans. On fridays they normally have baked potato, salad and whiting. Sometimes they cook it at home, and other times they go to [employer].  snacks: patient reports that she is not eating a lot of food  drinks: she is drinking water,  -Current exercise: she was doing water aerobics five days a week  -Educated on A1c and blood sugar goals; Counseled to check feet daily and get yearly eye exams -Counseled to check feet daily and get yearly eye exams -Counseled on diet and exercise extensively   Peripheral Vascular Disease: (LDL goal < 70) -Uncontrolled -Current treatment: Atorvastatin 80 mg tablet once per day at night Ezetimibe 10 mg tablet once per day in the morning  -Previous Medications: Rosuvastatin  -Current dietary patterns: she is using an air fryer frequently, she is eating plenty of vegetables and salad. The salads have fruit, cucumber, celery and broccoli, onion sometimes bacon.  -She reports she loves hot dogs with fries  -Current exercise habits: She is walking, up and down the stairs she is doing the pulmonary exercises at home  -Educated on Cholesterol goals;  Benefits of statin for ASCVD  risk reduction; Importance of limiting foods high in cholesterol; -Counseled on diet and exercise extensively Recommended to continue current medication  COPD (Goal: control symptoms and prevent exacerbations) -Controlled -Current treatment  Singulair 10 mg tablet once per day  ProAir HFA- use one puff every 6 hours  Anoro Ellipta 62.5- 25 MCH/INH - 1 puff daily   Patient reports that she does not have any because she needs a refill  -Gold Grade: Gold 2 (FEV1 50-79%) -Pulmonary function testing:  PFT: 01/07/2011-FEV1 1.59/79%, FEV1/FVC 0.73, FEF 25-75% improved to 38% with bronchodilator. TLC 90% DLCO 55% Office Spirometry 06/09/16- moderate restriction of exhaled volume. FVC 1.51/68%, FEV1 1.20/70%, ratio 0.79, FEF 25-75% 1.19/73% Nuclear Stress Test 09/04/16- EF 65%, low risk. ECHO 07/31/16- Gr 1 DD Walk test on room air 12/21/2017-minimal saturation 94%, maximum heart rate 118/minute with no stops walking 3 laps x180 m. Office Spirometry 12/21/17 PFT 04/13/20- Minimal Obstruction and Minimal Restriction, no resp to BD, Moderately severe DLCO defect WALK TEST 4/422- 2 laps, min O2 sat 94%, max HR 127.  -Exacerbations requiring treatment in last 6 months: patient reports that there have not been any exacerbations that required treatment in the last 6 months -Patient reports consistent use of maintenance inhaler -She reports waiting for refills of her Anoro inhaler from Dr. Annamaria Boots - Will check to see if there are samples of Anoro inhaler available for patient, and request refills from Dr. Annamaria Boots  -Frequency of rescue inhaler use: she has had to use it 2 times in the past 6 weeks, she tries to calm her breathing herself.  -She gets a feeling when she feels like she needs to use the inhaler but  -She was doing pulmonary classes twice per week on Tuesdays and Thursdays. She has exercises that she does from the pulmonary class -Counseled on Proper inhaler technique; When to use rescue inhaler -Recommended to continue current medication  Health Maintenance -Vaccine gaps: COVID-19 Booster, Pneumonia Vaccine   -Pneumonia vaccine: patient to receive in office during next visit on 02/13/2021  -Confirmed patient received COVID-19 booster on 12/28/2020 via [place] registry   -Will have PCP team update patients chart to reflect vaccine administration -Counseled on the  importance of vaccinations and patient is in agreement.   Patient Goals/Self-Care Activities Patient will:  - take medications as prescribed as evidenced by patient report and record review  Follow Up Plan: The patient has been provided with contact information for the care management team and has been advised to call with any health related questions or concerns.       Ms. Texidor was given information about Chronic Care Management services today including:  CCM service includes personalized support from designated clinical staff supervised by her physician, including individualized plan of care and coordination with other care providers 24/7 contact phone numbers for assistance for urgent and routine care needs. Standard insurance, coinsurance, copays and deductibles apply for chronic care management only during months in which we provide at least 20 minutes of these services. Most insurances cover these services at 100%, however patients may be responsible for any copay, coinsurance and/or deductible if applicable. This service may help you avoid the need for more expensive face-to-face services. Only one practitioner may furnish and bill the service in a calendar month. The patient may stop CCM services at any time (effective at the end of the month) by phone call to the office staff.  Patient agreed to services and verbal consent obtained.   The patient verbalized understanding of  instructions, educational materials, and care plan provided today and agreed to receive a mailed copy of patient instructions, educational materials, and care plan.   Orlando Penner, PharmD Clinical Pharmacist Practitioner  [facility] [phone]

## 2021-02-06 ENCOUNTER — Other Ambulatory Visit: Payer: Medicare Other | Admitting: *Deleted

## 2021-02-06 ENCOUNTER — Other Ambulatory Visit: Payer: Self-pay

## 2021-02-06 DIAGNOSIS — I25119 Atherosclerotic heart disease of native coronary artery with unspecified angina pectoris: Secondary | ICD-10-CM

## 2021-02-06 DIAGNOSIS — E7800 Pure hypercholesterolemia, unspecified: Secondary | ICD-10-CM

## 2021-02-07 ENCOUNTER — Telehealth: Payer: Self-pay | Admitting: Pharmacist

## 2021-02-07 LAB — LIPID PANEL
Chol/HDL Ratio: 2.7 ratio (ref 0.0–4.4)
Cholesterol, Total: 112 mg/dL (ref 100–199)
HDL: 41 mg/dL (ref >39)
LDL Chol Calc (NIH): 57 mg/dL (ref 0–99)
Triglycerides: 65 mg/dL (ref 0–149)
VLDL Cholesterol Cal: 14 mg/dL (ref 5–40)

## 2021-02-07 NOTE — Telephone Encounter (Signed)
Called pt and LVM to call back to review lab results LDL improved and very close to goal. Continue atorvastatin 80mg  daily AND ezetimbie 10mg  daily

## 2021-02-08 NOTE — Telephone Encounter (Signed)
Spoke to patient. Reviewed labs and advised she continue atorvastatin 80mg  daily AND ezetimbie 10mg . Pt appreciative of the call.

## 2021-02-09 DIAGNOSIS — E1165 Type 2 diabetes mellitus with hyperglycemia: Secondary | ICD-10-CM | POA: Diagnosis not present

## 2021-02-09 DIAGNOSIS — I10 Essential (primary) hypertension: Secondary | ICD-10-CM

## 2021-02-09 DIAGNOSIS — J441 Chronic obstructive pulmonary disease with (acute) exacerbation: Secondary | ICD-10-CM

## 2021-02-13 ENCOUNTER — Other Ambulatory Visit: Payer: Self-pay

## 2021-02-13 ENCOUNTER — Encounter: Payer: Self-pay | Admitting: Nurse Practitioner

## 2021-02-13 ENCOUNTER — Ambulatory Visit (INDEPENDENT_AMBULATORY_CARE_PROVIDER_SITE_OTHER): Payer: Medicare Other | Admitting: Nurse Practitioner

## 2021-02-13 VITALS — BP 146/80 | HR 70 | Temp 98.8°F | Ht 62.0 in | Wt 175.2 lb

## 2021-02-13 DIAGNOSIS — J449 Chronic obstructive pulmonary disease, unspecified: Secondary | ICD-10-CM | POA: Diagnosis not present

## 2021-02-13 DIAGNOSIS — E6609 Other obesity due to excess calories: Secondary | ICD-10-CM | POA: Diagnosis not present

## 2021-02-13 DIAGNOSIS — Z6832 Body mass index (BMI) 32.0-32.9, adult: Secondary | ICD-10-CM | POA: Diagnosis not present

## 2021-02-13 DIAGNOSIS — I5032 Chronic diastolic (congestive) heart failure: Secondary | ICD-10-CM

## 2021-02-13 DIAGNOSIS — E1165 Type 2 diabetes mellitus with hyperglycemia: Secondary | ICD-10-CM | POA: Diagnosis not present

## 2021-02-13 DIAGNOSIS — I110 Hypertensive heart disease with heart failure: Secondary | ICD-10-CM

## 2021-02-13 DIAGNOSIS — N3941 Urge incontinence: Secondary | ICD-10-CM | POA: Diagnosis not present

## 2021-02-13 MED ORDER — JANUMET 50-500 MG PO TABS: 1.0000 | ORAL_TABLET | Freq: Two times a day (BID) | ORAL | 1 refills | Status: DC

## 2021-02-13 NOTE — Patient Instructions (Signed)

## 2021-02-13 NOTE — Progress Notes (Signed)
I,Katawbba Wiggins,acting as a Education administrator for [employer], NP.,have documented all relevant documentation on the behalf of [employer], NP,as directed by  Bary Castilla, NP while in the presence of Bary Castilla, NP.  This visit occurred during the SARS-CoV-2 public health emergency.  Safety protocols were in place, including screening questions prior to the visit, additional usage of staff PPE, and extensive cleaning of exam room while observing appropriate contact time as indicated for disinfecting solutions.  Subjective:     Patient ID: Sheila Reynolds , female    DOB: 1945-03-10 , 76 y.o.   MRN: [id number]   Chief Complaint  Patient presents with   Hypertension   Diabetes    HPI  She presents today for DM/HTN check. She did not take her BP medication today. Her back still hurts. She is going for MRI. Her pulmonary doctor has ordered that for her. She has been trying eat better and healthier. She has not been getting a lot of exercise due to her knee. She was doing water aerobics. She is still incontinence for her urine  Hypertension This is a chronic problem. The current episode started more than 1 year ago. The problem has been gradually improving since onset. The problem is controlled. Pertinent negatives include no blurred vision, chest pain, headaches, palpitations or shortness of breath. Risk factors for coronary artery disease include diabetes mellitus, dyslipidemia, obesity, post-menopausal state and sedentary lifestyle.  Diabetes She presents for her follow-up diabetic visit. She has type 2 diabetes mellitus. Her disease course has been stable. There are no hypoglycemic associated symptoms. Pertinent negatives for hypoglycemia include no dizziness or headaches. Pertinent negatives for diabetes include no blurred vision, no chest pain, no polydipsia, no polyphagia, no polyuria and no weakness. There are no hypoglycemic complications. Diabetic complications include  heart disease. Her weight is stable. Her breakfast blood glucose is taken between 7-8 am. Her breakfast blood glucose range is generally 90-110 mg/dl. An ACE inhibitor/angiotensin II receptor blocker is being taken. Eye exam is current.    Past Medical History:  Diagnosis Date   Anemia    3 months ago anemic   Anxiety    on meds   Arthritis    "all over" (01/15/2017)   Asthma    Bronchitis with emphysema    Chest pain    Chronic bronchitis ([place])    Coronary artery disease    a. 01/2017 she underwent orbital atherectomy/DES to the proxmal LAD and PTCA to ostial D2. 2D Echo 01/15/17 showed mild LVH, EF 60-65%, grade 1 DD.   Family history of anesthesia complication    daughter N/V   Fibromyalgia    GERD (gastroesophageal reflux disease)    on meds   Glaucoma    Heart murmur    History of hiatal hernia    Hx of echocardiogram    Echo (03/2013):  Tech limited; Mild focal basal septal hypertrophy, EF 60-65%, normal RVF   Hyperlipidemia    Hypertension    Nonsustained ventricular tachycardia    OSA on CPAP    Premature atrial contractions    PVC (premature ventricular contraction)    a. Holter 12/16: NSR, occ PAC,PVCs   Sarcoidosis    Type II diabetes mellitus ([place])      Family History  Problem Relation Age of Onset   Heart disease Father    Diabetes Father    Glaucoma Father    Cancer Mother        unknown type, ?lung   Heart  disease Paternal Grandmother    Cancer Paternal Grandmother        unknown type   Diabetes Brother    Glaucoma Brother      Current Outpatient Medications:    acetaminophen (TYLENOL) 500 MG tablet, Take 1,000 mg by mouth 2 (two) times daily as needed for moderate pain or headache., Disp: , Rfl:    albuterol (PROVENTIL) (2.5 MG/3ML) 0.083% nebulizer solution, Take 3 mLs (2.5 mg total) by nebulization daily as needed for wheezing or shortness of breath., Disp: 75 mL, Rfl: 12   albuterol (VENTOLIN HFA) 108 (90 Base) MCG/ACT inhaler, Inhale 2 puffs  into the lungs every 6 (six) hours as needed for wheezing or shortness of breath., Disp: 18 g, Rfl: 12   aspirin EC 81 MG tablet, Take 81 mg by mouth daily. Swallow whole., Disp: , Rfl:    atorvastatin (LIPITOR) 80 MG tablet, Take 1 tablet (80 mg total) by mouth daily., Disp: 90 tablet, Rfl: 3   azelastine (ASTELIN) 0.1 % nasal spray, Place 1 spray into both nostrils 2 (two) times daily. Use in each nostril as directed, Disp: 30 mL, Rfl: 1   Blood Glucose Monitoring Suppl (ACCU-CHEK AVIVA PLUS) w/Device KIT, Use to check blood sugars 3 times a day. Dx code e11.65, Disp: 1 kit, Rfl: 3   brimonidine (ALPHAGAN P) 0.1 % SOLN, Place 1 drop into both eyes 2 (two) times daily. , Disp: , Rfl:    brinzolamide (AZOPT) 1 % ophthalmic suspension, Place 1 drop into both eyes 3 (three) times daily., Disp: , Rfl:    carvedilol (COREG) 12.5 MG tablet, TAKE 1 TABLET(12.5 MG) BY MOUTH TWICE DAILY, Disp: 180 tablet, Rfl: 2   clopidogrel (PLAVIX) 75 MG tablet, Take 1 tablet (75 mg total) by mouth daily., Disp: 90 tablet, Rfl: 3   EPINEPHrine 0.3 mg/0.3 mL IJ SOAJ injection, Inject 0.3 mg into the muscle as needed for anaphylaxis., Disp: 1 each, Rfl: 0   ezetimibe (ZETIA) 10 MG tablet, Take 1 tablet (10 mg total) by mouth daily., Disp: 90 tablet, Rfl: 3   furosemide (LASIX) 40 MG tablet, Take 1 tablet (40 mg total) by mouth every other day., Disp: 90 tablet, Rfl: 1   isosorbide mononitrate (IMDUR) 60 MG 24 hr tablet, TAKE 1 TABLET(60 MG) BY MOUTH DAILY, Disp: 90 tablet, Rfl: 2   ketorolac (ACULAR) 0.5 % ophthalmic solution, Place 1 drop into the right eye 3 (three) times daily., Disp: , Rfl:    Lancets (ONETOUCH DELICA PLUS [id number]) MISC, CHECK BLOOD SUGAR BEFORE BREAKFAST AND DINNER, Disp: 100 each, Rfl: 1   montelukast (SINGULAIR) 10 MG tablet, TAKE 1 TABLET(10 MG) BY MOUTH EVERY EVENING, Disp: 90 tablet, Rfl: 1   Nebulizers (COMPRESSOR/NEBULIZER) MISC, Use as directed, Disp: 1 each, Rfl: 0   pantoprazole (PROTONIX)  40 MG tablet, Take 40 mg by mouth daily., Disp: , Rfl:    Polyvinyl Alcohol-Povidone PF (REFRESH) 1.4-0.6 % SOLN, Place 1-2 drops into both eyes 3 (three) times daily as needed (dry/irritated eyes.)., Disp: , Rfl:    potassium chloride SA (KLOR-CON) 20 MEQ tablet, Take 1 tablet (20 mEq total) by mouth every other day., Disp: 90 tablet, Rfl: 1   prednisoLONE acetate (PRED FORTE) 1 % ophthalmic suspension, Place 1 drop into both eyes 4 (four) times daily. , Disp: , Rfl:    pregabalin (LYRICA) 75 MG capsule, TAKE 1 CAPSULE(75 MG) BY MOUTH TWICE DAILY, Disp: 180 capsule, Rfl: 1   topiramate (TOPAMAX) 25 MG tablet, Take  1 tablet (25 mg total) by mouth at bedtime., Disp: 120 tablet, Rfl: 3   Travoprost, BAK Free, (TRAVATAN) 0.004 % SOLN ophthalmic solution, Place 1 drop into the right eye at bedtime. , Disp: , Rfl:    umeclidinium-vilanterol (ANORO ELLIPTA) 62.5-25 MCG/ACT AEPB, Inhale 1 puff into the lungs daily at 6 (six) AM., Disp: 1 each, Rfl: 5   nitroGLYCERIN (NITROSTAT) 0.4 MG SL tablet, Place 1 tablet (0.4 mg total) under the tongue every 5 (five) minutes as needed for chest pain., Disp: 25 tablet, Rfl: prn   sitaGLIPtin-metformin (JANUMET) 50-500 MG tablet, Take 1 tablet by mouth 2 (two) times daily with a meal., Disp: 180 tablet, Rfl: 1   Allergies  Allergen Reactions   Crestor [Rosuvastatin Calcium] Other (See Comments)    muscle aches   Demerol  [Meperidine Hcl]     Other reaction(s): Hallucinations   Shellfish Allergy Itching and Other (See Comments)    Crab, shrimp and lobster ---lips itch and tingle Was told not to eat again after having a allergy test. Lobster, crab and shrimp      Review of Systems  Constitutional: Negative.  Negative for chills and fever.  HENT:  Negative for congestion.   Eyes:  Negative for blurred vision.  Respiratory: Negative.  Negative for cough, choking, shortness of breath and wheezing.   Cardiovascular: Negative.  Negative for chest pain and  palpitations.  Gastrointestinal: Negative.   Endocrine: Negative for polydipsia, polyphagia and polyuria.  Genitourinary:  Positive for frequency.  Neurological:  Negative for dizziness, weakness and headaches.  Psychiatric/Behavioral: Negative.    All other systems reviewed and are negative.   Today's Vitals   02/13/21 1511  BP: (!) 146/80  Pulse: 70  Temp: 98.8 F (37.1 C)  Weight: 175 lb 3.2 oz (79.5 kg)  Height: 5' 2"  (1.575 m)  PainSc: 0-No pain   Body mass index is 32.04 kg/m.  Wt Readings from Last 3 Encounters:  02/13/21 175 lb 3.2 oz (79.5 kg)  12/11/20 175 lb 4.3 oz (79.5 kg)  12/03/20 176 lb 3.2 oz (79.9 kg)    BP Readings from Last 3 Encounters:  02/13/21 (!) 146/80  12/03/20 124/70  11/15/20 (!) 151/65    Objective:  Physical Exam Constitutional:      Appearance: Normal appearance.  HENT:     Head: Normocephalic and atraumatic.  Cardiovascular:     Rate and Rhythm: Normal rate and regular rhythm.     Pulses: Normal pulses.     Heart sounds: Normal heart sounds. No murmur heard. Pulmonary:     Effort: Pulmonary effort is normal. No respiratory distress.     Breath sounds: Normal breath sounds. No wheezing.  Skin:    Capillary Refill: Capillary refill takes less than 2 seconds.  Neurological:     Mental Status: She is alert.        Assessment And Plan:     1. Uncontrolled type 2 diabetes mellitus with hyperglycemia ([place]) -Continue to take her meds., chronic  --Discussed with patient the importance of glycemic control and long term complications from uncontrolled diabetes. Discussed with the patient the importance of compliance with home glucose monitoring, diet which includes decrease amount of sugary drinks and foods. Importance of exercise was also discussed with the patient. Importance of eye exams, self foot care and compliance to office visits was also discussed with the patient.  - Hemoglobin A1c - sitaGLIPtin-metformin (JANUMET) 50-500 MG  tablet; Take 1 tablet by mouth 2 (two) times  daily with a meal.  Dispense: 180 tablet; Refill: 1  2. 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 heart failure ([place]) -Continue take meds -Followed by cardiology  -Limit the intake of processed foods and salt intake. You should increase your intake of green vegetables and fruits. Limit the use of alcohol. Limit fast foods and fried foods. Avoid high fatty saturated and trans fat foods. Keep yourself hydrated with drinking water. Avoid red meats. Eat lean meats instead. Exercise for atleast 30-45 min for atleast 4-5 times a week.  - CMP14+EGFR - CBC no Diff  3. Urge incontinence of urine -Educated her on Kegel exercises.  -She has followed by urology   4. 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   5. Class 1 obesity due to excess calories with serious comorbidity and body mass index (BMI) of 32.0 to 32.9 in adult -Advised patient on a healthy diet including avoiding fast food and red meats. Increase the intake of lean meats including grilled chicken and [place].  Drink a lot of water. Decrease intake of fatty foods. Exercise for 30-45 min. 4-5 a week to decrease the risk of cardiac event.   The patient was encouraged to call or send a message through [place] for any questions or concerns.   Follow up: if symptoms persist or do not get better.   Side effects and appropriate use of all the medication(s) were discussed with the patient today. Patient advised to use the medication(s) as directed by their healthcare provider. The patient was encouraged to read, review, and understand all associated package inserts and contact our office with any questions or concerns. The patient accepts the risks of the treatment plan and had an opportunity to ask questions.   Patient was given opportunity to ask questions. Patient verbalized understanding of the  plan and was able to repeat key elements of the plan. All questions were answered to their satisfaction.  Raman Kenli Waldo, DNP   I, Raman Longino Trefz have reviewed all documentation for this visit. The documentation on 02/14/20 for the exam, diagnosis, procedures, and orders are all accurate and complete.    IF YOU HAVE BEEN REFERRED TO A SPECIALIST, IT MAY TAKE 1-2 WEEKS TO SCHEDULE/PROCESS THE REFERRAL. IF YOU HAVE NOT HEARD FROM US/SPECIALIST IN TWO WEEKS, PLEASE GIVE [place] A CALL AT [phone].   THE PATIENT IS ENCOURAGED TO PRACTICE SOCIAL DISTANCING DUE TO THE COVID-19 PANDEMIC.

## 2021-02-14 LAB — HEMOGLOBIN A1C
Est. average glucose Bld gHb Est-mCnc: 131 mg/dL
Hgb A1c MFr Bld: 6.2 % — ABNORMAL HIGH (ref 4.8–5.6)

## 2021-02-14 LAB — CBC
Hematocrit: 35.8 % (ref 34.0–46.6)
Hemoglobin: 11.7 g/dL (ref 11.1–15.9)
MCH: 27.1 pg (ref 26.6–33.0)
MCHC: 32.7 g/dL (ref 31.5–35.7)
MCV: 83 fL (ref 79–97)
Platelets: 201 10*3/uL (ref 150–450)
RBC: 4.31 x10E6/uL (ref 3.77–5.28)
RDW: 14 % (ref 11.7–15.4)
WBC: 7.8 10*3/uL (ref 3.4–10.8)

## 2021-02-14 LAB — CMP14+EGFR
ALT: 11 IU/L (ref 0–32)
AST: 17 IU/L (ref 0–40)
Albumin/Globulin Ratio: 2 (ref 1.2–2.2)
Albumin: 4.5 g/dL (ref 3.7–4.7)
Alkaline Phosphatase: 104 IU/L (ref 44–121)
BUN/Creatinine Ratio: 16 (ref 12–28)
BUN: 13 mg/dL (ref 8–27)
Bilirubin Total: 0.4 mg/dL (ref 0.0–1.2)
CO2: 27 mmol/L (ref 20–29)
Calcium: 9.5 mg/dL (ref 8.7–10.3)
Chloride: 104 mmol/L (ref 96–106)
Creatinine, Ser: 0.83 mg/dL (ref 0.57–1.00)
Globulin, Total: 2.2 g/dL (ref 1.5–4.5)
Glucose: 109 mg/dL — ABNORMAL HIGH (ref 70–99)
Potassium: 4.4 mmol/L (ref 3.5–5.2)
Sodium: 141 mmol/L (ref 134–144)
Total Protein: 6.7 g/dL (ref 6.0–8.5)
eGFR: 74 mL/min/{1.73_m2} (ref >59)

## 2021-02-15 ENCOUNTER — Ambulatory Visit: Payer: Medicare Other | Admitting: Internal Medicine

## 2021-02-18 ENCOUNTER — Other Ambulatory Visit: Payer: Self-pay

## 2021-02-18 ENCOUNTER — Ambulatory Visit (HOSPITAL_COMMUNITY)
Admission: RE | Admit: 2021-02-18 | Discharge: 2021-02-18 | Disposition: A | Discharge status: Home / Self Care | Payer: Medicare Other | Source: Ambulatory Visit | Attending: Internal Medicine | Admitting: Internal Medicine

## 2021-02-18 DIAGNOSIS — R918 Other nonspecific abnormal finding of lung field: Secondary | ICD-10-CM | POA: Insufficient documentation

## 2021-02-18 DIAGNOSIS — J84112 Idiopathic pulmonary fibrosis: Secondary | ICD-10-CM | POA: Diagnosis not present

## 2021-02-18 DIAGNOSIS — K449 Diaphragmatic hernia without obstruction or gangrene: Secondary | ICD-10-CM | POA: Diagnosis not present

## 2021-02-18 DIAGNOSIS — J8489 Other specified interstitial pulmonary diseases: Secondary | ICD-10-CM | POA: Diagnosis not present

## 2021-02-18 DIAGNOSIS — D869 Sarcoidosis, unspecified: Secondary | ICD-10-CM | POA: Diagnosis not present

## 2021-02-18 MED ORDER — CLOPIDOGREL BISULFATE 75 MG PO TABS: 75.0000 mg | ORAL_TABLET | Freq: Every day | ORAL | 1 refills | Status: DC

## 2021-02-18 NOTE — Progress Notes (Signed)
HPI  F former smoker followed for bronchitis, hx  Occular sarcoid, bronchitis/ nodules, OSA complicated by GERD, glaucoma, DM2 NPSG 12/25/03- AHI  2.9/ hr, desaturation to 91%, body weight 164 lbs NPSG-10/18/13- Mild OSA, AHI 9.3/ hr, weight 164 lbs  ACE 09/17/15-47-WNL ACE level 08/18/16-30 ACE level 05/14/20- 22 PFT: 01/07/2011-FEV1 1.59/79%, FEV1/FVC 0.73, FEF 25-75% improved to 38% with bronchodilator. TLC 90% DLCO 55% Office Spirometry 06/09/16- moderate restriction of exhaled volume. FVC 1.51/68%, FEV1 1.20/70%, ratio 0.79, FEF 25-75% 1.19/73% Nuclear Stress Test 09/04/16- EF 65%, low risk. ECHO 07/31/16- Gr 1 DD Walk test on room air 12/21/2017-minimal saturation 94%, maximum heart rate 118/minute with no stops walking 3 laps x180 m. Office Spirometry 12/21/17 PFT 04/13/20- Minimal Obstruction and Minimal Restriction, no resp to BD, Moderately severe DLCO defect WALK TEST 4/422- 2 laps, min O2 sat 94%, max HR 127.  ACE 22 wnl 05/14/20  ---------------------------------------------------------------------------------------------------------------  CT chest- 04/30/20- IMPRESSION: 1. 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 Repeat high-resolution chest CT is recommended in 12 months to assess for temporal changes in the appearance of the lung parenchyma. 2. In addition, there is what appears to be chronic and worsening mucoid impaction in the right upper lobe, potentially related to a chronic indolent atypical infectious process such as mycobacterial infection. Attention at time of repeat high-resolution chest CT is recommended. 3. There is also mild centrilobular and paraseptal emphysema. 4. Aortic atherosclerosis, in addition to left main and 3 vessel coronary artery disease. Assessment for potential risk factor modification, dietary therapy or  pharmacologic therapy may be warranted, if clinically indicated. 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 Need repeat CT now for ILD progression Download- Body weight today-176 lbs Covid vax-3 Phizer Flu vax-had -----Patient feels like breathing is getting worse with exertion. Has spot on her left leg that hurts.  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  Anoro seems to be some help.  Chronic cough with white/clear sputum or just dry.  Using throat lozenges and Robitussin with honey.  PFT done in March 2022 showed mild obstruction, mild restriction little response to bronchodilator but moderately severe diffusion defect. Dr. Acie Fredrickson continues to follow for cardiology. No real change in her chronic pain involving mid thoracic back, usually around her right posterior axillary line, sometimes to the left.  I have suspected it was neurogenic,  but never proven.  Cough makes it worse. CPAP okay but empties humidifier reservoir.  Still feels  somewhat dry.  We are going to try reducing pressure range and refitting her mask for better seal. CT results reviewed with her discussing the suspicion that she may have sarcoid changes in the upper lung and possibly UIP in the lower lung zones.  Key is to watch for progression as discussed with her. CT chest 02/18/21- IMPRESSION: 1. Unusual spectrum of chronic pulmonary parenchymal findings including patchy nodular peripheral peribronchovascular interstitial thickening in the apical right upper lobe and basilar predominant fibrotic interstitial lung disease without frank honeycombing. No compelling interval progression since 04/30/2020 high-resolution chest CT study, although there is clear progression compared to more remote chest CT of 02/21/2013. This combination of findings is favored to represent the sequela of chronic pulmonary sarcoidosis, although a combination of mild upper lung sarcoidosis with basilar UIP is difficult to exclude. Findings are indeterminate for UIP per consensus guidelines: Diagnosis of Idiopathic Pulmonary Fibrosis: An Official ATS/ERS/JRS/ALAT Clinical Practice Guideline. [place], Iss 5, [phone], Oct 11 2016. 2. No thoracic adenopathy. 3. Three-vessel coronary atherosclerosis. Stable borderline mild cardiomegaly. 4. Small hiatal hernia. 5. Aortic Atherosclerosis (ICD10-I70.0) and Emphysema (ICD10-J43.9).  ROS-see HPI     + = positive Constitutional:   No-   weight loss, night sweats, fevers, chills, fatigue, lassitude. HEENT:   +  headaches, difficulty swallowing, tooth/dental problems, sore throat,       Some  sneezing, itching, ear ache, +nasal congestion, post nasal drip,  CV: + chest pain, no-orthopnea, PND, swelling in lower extremities, anasarca, dizziness, palpitations Resp: +  shortness of breath with exertion not at rest.            productive cough,  + non-productive cough,  No- coughing up of blood.               in  color of mucus.   wheezing.   Skin: No-   rash or lesions. GI:  +  heartburn, indigestion, abdominal pain, nausea, vomiting,  GU:  MS:  +  joint pain or swelling, + back pain Neuro-     nothing unusual Psych:  No- change in mood or affect. No depression or anxiety.  No memory loss.  OBJ  Afebrile General- Alert, Oriented, Affect tearful, Distress- none acute,  Skin- rash-none, lesions- none, excoriation- none Lymphadenopathy- none Head- atraumatic            Eyes- Gross vision intact, PERRLA, conjunctivae clear secretions- not injected            Ears- Hearing aid             Nose-  turbinate edema, no-Septal dev, mucus, polyps, erosion, perforation             Throat- Mallampati III , mucosa  , drainage- none, tonsils- atrophic Neck- flexible , trachea midline, no stridor , thyroid nl, carotid no bruit Chest - symmetrical excursion , unlabored           Heart/CV- RRR , no murmur , no gallop  , no rub, nl s1 s2                           - JVD- none , edema- none, stasis changes- none, varices- none           Lung-  Clear, Cough+, wheeze-none, rhonchi-none , dullness-none, rub- none  Chest wall-  Abd- No HSM Br/ Gen/ Rectal- Not done, not indicated Extrem- cyanosis- none, clubbing, none, atrophy- none, strength- nl. +elastic hose Neuro- grossly intact to observation

## 2021-02-19 ENCOUNTER — Ambulatory Visit: Payer: Medicare Other | Admitting: Adult Health

## 2021-02-19 ENCOUNTER — Ambulatory Visit (INDEPENDENT_AMBULATORY_CARE_PROVIDER_SITE_OTHER): Payer: Medicare Other | Admitting: Internal Medicine

## 2021-02-19 ENCOUNTER — Encounter: Payer: Self-pay | Admitting: Internal Medicine

## 2021-02-19 ENCOUNTER — Telehealth: Payer: Self-pay | Admitting: *Deleted

## 2021-02-19 VITALS — BP 140/72 | HR 69 | Temp 97.7°F | Ht 62.0 in | Wt 174.6 lb

## 2021-02-19 DIAGNOSIS — J849 Interstitial pulmonary disease, unspecified: Secondary | ICD-10-CM | POA: Diagnosis not present

## 2021-02-19 DIAGNOSIS — R0789 Other chest pain: Secondary | ICD-10-CM | POA: Diagnosis not present

## 2021-02-19 DIAGNOSIS — J449 Chronic obstructive pulmonary disease, unspecified: Secondary | ICD-10-CM | POA: Diagnosis not present

## 2021-02-19 DIAGNOSIS — G4733 Obstructive sleep apnea (adult) (pediatric): Secondary | ICD-10-CM

## 2021-02-19 DIAGNOSIS — D869 Sarcoidosis, unspecified: Secondary | ICD-10-CM | POA: Diagnosis not present

## 2021-02-19 MED ORDER — BENZONATATE 200 MG PO CAPS: 200.0000 mg | ORAL_CAPSULE | Freq: Three times a day (TID) | ORAL | 2 refills | Status: DC | PRN

## 2021-02-19 MED ORDER — UMECLIDINIUM-VILANTEROL 62.5-25 MCG/ACT IN AEPB: 1.0000 | INHALATION_SPRAY | Freq: Every day | RESPIRATORY_TRACT | 12 refills | Status: DC

## 2021-02-19 NOTE — Chronic Care Management (AMB) (Signed)
°  Care Management   Note  02/19/2021 Name: CAIDEN MONSIVAIS MRN: [id number] DOB: 04-01-1945  Maricela Bo Virgin is a 76 y.o. year old female who is a primary care patient of Glendale Chard, MD and is actively engaged with the care management team. I reached out to [place] by phone today to assist with re-scheduling a follow up visit with the RN Case Manager  Follow up plan: Unsuccessful telephone outreach attempt made. The care management team will reach out to the patient again over the next 7 days. If patient returns call to provider office, please advise to call [place] at [phone].  [facility]  Direct Dial: [phone]

## 2021-02-19 NOTE — Patient Instructions (Addendum)
Order- schedule CPAP mask fitting/ desensitization at sleep center  Order- DME [facility]- please reduce autopap range to 5-12. Please help her with humidifier setting- runs dry at night.  Script sent to try benzonatate (Tessalon) perles for cough  Script sent refilling Anoro inhaler  Order- future HRCT chest ILD protocol in 6 months     dx ILD

## 2021-02-21 ENCOUNTER — Encounter: Payer: Self-pay | Admitting: Internal Medicine

## 2021-02-21 ENCOUNTER — Telehealth: Payer: Medicare Other

## 2021-02-21 NOTE — Assessment & Plan Note (Signed)
Potential contributors reviewed.  She has inflammatory changes/scarring which are primary cause of cough. Plan-try benzonatate Perles, continue Anoro

## 2021-02-21 NOTE — Assessment & Plan Note (Signed)
To improve CPAP comfort we are going to reduce her pressure a little trying to reduce airflow and drying at the humidifier reservoir.  Also seek mask fitting at the sleep center for better seal.  Overall she does still benefit from CPAP.

## 2021-02-21 NOTE — Assessment & Plan Note (Signed)
Mostly back pain, although it shifts.  Hopefully if we can keep cough suppressed a bit that  will help.

## 2021-02-21 NOTE — Assessment & Plan Note (Signed)
ACE level has not been elevated.  Cannot exclude a component of burned-out sarcoid probably decades old.  Watching for active progressive interstitial disease. We anticipate repeat CT in 6 months

## 2021-02-26 ENCOUNTER — Emergency Department (HOSPITAL_COMMUNITY)
Admission: EM | Admit: 2021-02-26 | Discharge: 2021-02-26 | Disposition: A | Discharge status: Home / Self Care | Payer: Medicare Other | Attending: Emergency Medicine | Admitting: Emergency Medicine

## 2021-02-26 ENCOUNTER — Emergency Department (HOSPITAL_COMMUNITY): Payer: Medicare Other

## 2021-02-26 ENCOUNTER — Other Ambulatory Visit: Payer: Self-pay

## 2021-02-26 ENCOUNTER — Encounter (HOSPITAL_COMMUNITY): Payer: Self-pay

## 2021-02-26 DIAGNOSIS — Z7984 Long term (current) use of oral hypoglycemic drugs: Secondary | ICD-10-CM | POA: Insufficient documentation

## 2021-02-26 DIAGNOSIS — I959 Hypotension, unspecified: Secondary | ICD-10-CM | POA: Diagnosis not present

## 2021-02-26 DIAGNOSIS — R42 Dizziness and giddiness: Secondary | ICD-10-CM | POA: Insufficient documentation

## 2021-02-26 DIAGNOSIS — I6381 Other cerebral infarction due to occlusion or stenosis of small artery: Secondary | ICD-10-CM | POA: Diagnosis not present

## 2021-02-26 DIAGNOSIS — I6523 Occlusion and stenosis of bilateral carotid arteries: Secondary | ICD-10-CM | POA: Diagnosis not present

## 2021-02-26 DIAGNOSIS — R531 Weakness: Secondary | ICD-10-CM | POA: Insufficient documentation

## 2021-02-26 DIAGNOSIS — E1165 Type 2 diabetes mellitus with hyperglycemia: Secondary | ICD-10-CM | POA: Diagnosis not present

## 2021-02-26 DIAGNOSIS — R110 Nausea: Secondary | ICD-10-CM | POA: Insufficient documentation

## 2021-02-26 DIAGNOSIS — G4489 Other headache syndrome: Secondary | ICD-10-CM | POA: Diagnosis not present

## 2021-02-26 DIAGNOSIS — Z7951 Long term (current) use of inhaled steroids: Secondary | ICD-10-CM | POA: Diagnosis not present

## 2021-02-26 DIAGNOSIS — R197 Diarrhea, unspecified: Secondary | ICD-10-CM | POA: Diagnosis not present

## 2021-02-26 DIAGNOSIS — G319 Degenerative disease of nervous system, unspecified: Secondary | ICD-10-CM | POA: Diagnosis not present

## 2021-02-26 DIAGNOSIS — Z7982 Long term (current) use of aspirin: Secondary | ICD-10-CM | POA: Diagnosis not present

## 2021-02-26 DIAGNOSIS — Z743 Need for continuous supervision: Secondary | ICD-10-CM | POA: Diagnosis not present

## 2021-02-26 DIAGNOSIS — Z79899 Other long term (current) drug therapy: Secondary | ICD-10-CM | POA: Insufficient documentation

## 2021-02-26 DIAGNOSIS — I10 Essential (primary) hypertension: Secondary | ICD-10-CM | POA: Diagnosis not present

## 2021-02-26 DIAGNOSIS — Z7902 Long term (current) use of antithrombotics/antiplatelets: Secondary | ICD-10-CM | POA: Diagnosis not present

## 2021-02-26 DIAGNOSIS — J449 Chronic obstructive pulmonary disease, unspecified: Secondary | ICD-10-CM | POA: Diagnosis not present

## 2021-02-26 DIAGNOSIS — I2510 Atherosclerotic heart disease of native coronary artery without angina pectoris: Secondary | ICD-10-CM | POA: Diagnosis not present

## 2021-02-26 DIAGNOSIS — G44209 Tension-type headache, unspecified, not intractable: Secondary | ICD-10-CM | POA: Insufficient documentation

## 2021-02-26 DIAGNOSIS — I6782 Cerebral ischemia: Secondary | ICD-10-CM | POA: Diagnosis not present

## 2021-02-26 DIAGNOSIS — I6621 Occlusion and stenosis of right posterior cerebral artery: Secondary | ICD-10-CM | POA: Diagnosis not present

## 2021-02-26 DIAGNOSIS — R0602 Shortness of breath: Secondary | ICD-10-CM | POA: Diagnosis not present

## 2021-02-26 LAB — CBC WITH DIFFERENTIAL/PLATELET
Abs Immature Granulocytes: 0.03 10*3/uL (ref 0.00–0.07)
Basophils Absolute: 0 10*3/uL (ref 0.0–0.1)
Basophils Relative: 0 %
Eosinophils Absolute: 0.2 10*3/uL (ref 0.0–0.5)
Eosinophils Relative: 2 %
HCT: 37.1 % (ref 36.0–46.0)
Hemoglobin: 11.8 g/dL — ABNORMAL LOW (ref 12.0–15.0)
Immature Granulocytes: 0 %
Lymphocytes Relative: 20 %
Lymphs Abs: 1.7 10*3/uL (ref 0.7–4.0)
MCH: 27.1 pg (ref 26.0–34.0)
MCHC: 31.8 g/dL (ref 30.0–36.0)
MCV: 85.1 fL (ref 80.0–100.0)
Monocytes Absolute: 0.6 10*3/uL (ref 0.1–1.0)
Monocytes Relative: 7 %
Neutro Abs: 6 10*3/uL (ref 1.7–7.7)
Neutrophils Relative %: 71 %
Platelets: 211 10*3/uL (ref 150–400)
RBC: 4.36 MIL/uL (ref 3.87–5.11)
RDW: 14.2 % (ref 11.5–15.5)
WBC: 8.5 10*3/uL (ref 4.0–10.5)
nRBC: 0 % (ref 0.0–0.2)

## 2021-02-26 LAB — I-STAT CHEM 8, ED
BUN: 10 mg/dL (ref 8–23)
Calcium, Ion: 0.79 mmol/L — CL (ref 1.15–1.40)
Chloride: 114 mmol/L — ABNORMAL HIGH (ref 98–111)
Creatinine, Ser: 0.4 mg/dL — ABNORMAL LOW (ref 0.44–1.00)
Glucose, Bld: 72 mg/dL (ref 70–99)
HCT: 23 % — ABNORMAL LOW (ref 36.0–46.0)
Hemoglobin: 7.8 g/dL — ABNORMAL LOW (ref 12.0–15.0)
Potassium: 3 mmol/L — ABNORMAL LOW (ref 3.5–5.1)
Sodium: 144 mmol/L (ref 135–145)
TCO2: 16 mmol/L — ABNORMAL LOW (ref 22–32)

## 2021-02-26 LAB — COMPREHENSIVE METABOLIC PANEL
ALT: 20 U/L (ref 0–44)
AST: 26 U/L (ref 15–41)
Albumin: 3.9 g/dL (ref 3.5–5.0)
Alkaline Phosphatase: 87 U/L (ref 38–126)
Anion gap: 8 (ref 5–15)
BUN: 15 mg/dL (ref 8–23)
CO2: 24 mmol/L (ref 22–32)
Calcium: 9.3 mg/dL (ref 8.9–10.3)
Chloride: 104 mmol/L (ref 98–111)
Creatinine, Ser: 0.96 mg/dL (ref 0.44–1.00)
GFR, Estimated: >60 mL/min (ref >60)
Glucose, Bld: 110 mg/dL — ABNORMAL HIGH (ref 70–99)
Potassium: 4.3 mmol/L (ref 3.5–5.1)
Sodium: 136 mmol/L (ref 135–145)
Total Bilirubin: 0.3 mg/dL (ref 0.3–1.2)
Total Protein: 6.8 g/dL (ref 6.5–8.1)

## 2021-02-26 LAB — TROPONIN I (HIGH SENSITIVITY)
Troponin I (High Sensitivity): 4 ng/L (ref <18)
Troponin I (High Sensitivity): 3 ng/L (ref <18)

## 2021-02-26 MED ORDER — METOCLOPRAMIDE HCL 5 MG/ML IJ SOLN
10.0000 mg | Freq: Once | INTRAMUSCULAR | Status: AC
  Administered 2021-02-26: 10 mg via INTRAVENOUS
  Filled 2021-02-26: qty 2

## 2021-02-26 MED ORDER — DIPHENHYDRAMINE HCL 50 MG/ML IJ SOLN
25.0000 mg | Freq: Once | INTRAMUSCULAR | Status: AC
  Administered 2021-02-26: 25 mg via INTRAVENOUS
  Filled 2021-02-26: qty 1

## 2021-02-26 MED ORDER — IOHEXOL 350 MG/ML SOLN
75.0000 mL | Freq: Once | INTRAVENOUS | Status: AC | PRN
  Administered 2021-02-26: 75 mL via INTRAVENOUS

## 2021-02-26 NOTE — ED Provider Notes (Signed)
[facility] EMERGENCY DEPARTMENT Provider Note   CSN: [id number] Arrival date & time: 02/26/21  1619     History  Chief Complaint  Patient presents with   Headache   Weakness    Sheila Reynolds is a 76 y.o. female with a past medical history significant for interstitial lung disease, coronary artery disease, hypertension, COPD, diabetes, fibromyalgia who presents with new onset headache, dizziness, weakness.  Patient reports that she has had some temporal versus frontal headaches for the last few days, but had worsening headache today.  Patient reports that she had an event where she felt lightheaded, dizzy, did not pass out but felt like she was going to, associated with this she had some nausea without vomiting, and diarrhea.  Patient reports she chronically feels short of breath, worsening short of breath during this event but feels okay at this moment.  Patient denies any chest pain.  Patient endorses some blurry vision, denies photosensitivity.  Patient denies confusion, unilateral weakness.  Patient does report that she had a delayed ability to rise after her near syncopal event.  Patient denies any tonic-clonic activity, denies urinary or fecal incontinence.   Headache Associated symptoms: weakness   Weakness Associated symptoms: headaches       Home Medications Prior to Admission medications   Medication Sig Start Date End Date Taking? Authorizing Provider  acetaminophen (TYLENOL) 500 MG tablet Take 1,000 mg by mouth 2 (two) times daily as needed for moderate pain or headache.    [provider]  albuterol (PROVENTIL) (2.5 MG/3ML) 0.083% nebulizer solution Take 3 mLs (2.5 mg total) by nebulization daily as needed for wheezing or shortness of breath. 04/22/18   Deneise Lever, MD  albuterol (VENTOLIN HFA) 108 (90 Base) MCG/ACT inhaler Inhale 2 puffs into the lungs every 6 (six) hours as needed for wheezing or shortness of breath. 12/30/19   Baird Lyons D, MD  aspirin EC 81 MG tablet Take 81 mg by mouth daily. Swallow whole.    [provider]  atorvastatin (LIPITOR) 80 MG tablet Take 1 tablet (80 mg total) by mouth daily. 12/13/20   [name], Wonda Cheng, MD  azelastine (ASTELIN) 0.1 % nasal spray Place 1 spray into both nostrils 2 (two) times daily. Use in each nostril as directed 01/14/21   Glendale Chard, MD  benzonatate (TESSALON) 200 MG capsule Take 1 capsule (200 mg total) by mouth 3 (three) times daily as needed for cough. 02/19/21   Baird Lyons D, MD  Blood Glucose Monitoring Suppl (ACCU-CHEK AVIVA PLUS) w/Device KIT Use to check blood sugars 3 times a day. Dx code e11.65 01/03/20   Glendale Chard, MD  brimonidine (ALPHAGAN P) 0.1 % SOLN Place 1 drop into both eyes 2 (two) times daily.     [provider]  brinzolamide (AZOPT) 1 % ophthalmic suspension Place 1 drop into both eyes 3 (three) times daily.    [provider]  carvedilol (COREG) 12.5 MG tablet TAKE 1 TABLET(12.5 MG) BY MOUTH TWICE DAILY 12/20/20   [name], Wonda Cheng, MD  clopidogrel (PLAVIX) 75 MG tablet Take 1 tablet (75 mg total) by mouth daily. 02/18/21   [name], Wonda Cheng, MD  EPINEPHrine 0.3 mg/0.3 mL IJ SOAJ injection Inject 0.3 mg into the muscle as needed for anaphylaxis. 09/12/20   Bary Castilla, NP  ezetimibe (ZETIA) 10 MG tablet Take 1 tablet (10 mg total) by mouth daily. 11/08/20   Glendale Chard, MD  furosemide (LASIX) 40 MG tablet Take  1 tablet (40 mg total) by mouth every other day. 06/16/19   [name], Wonda Cheng, MD  isosorbide mononitrate (IMDUR) 60 MG 24 hr tablet TAKE 1 TABLET(60 MG) BY MOUTH DAILY 12/20/20   [name], Wonda Cheng, MD  ketorolac (ACULAR) 0.5 % ophthalmic solution Place 1 drop into the right eye 3 (three) times daily. 08/02/20   [provider]  Lancets (ONETOUCH DELICA PLUS OACZYS06T) [place] BREAKFAST AND DINNER 09/12/20   Ghumman, Ramandeep, NP  montelukast (SINGULAIR) 10 MG tablet TAKE 1  TABLET(10 MG) BY MOUTH EVERY EVENING 06/09/19   Glendale Chard, MD  Nebulizers (COMPRESSOR/NEBULIZER) MISC Use as directed 04/22/18   Deneise Lever, MD  nitroGLYCERIN (NITROSTAT) 0.4 MG SL tablet Place 1 tablet (0.4 mg total) under the tongue every 5 (five) minutes as needed for chest pain. 07/25/19 01/23/21  Glendale Chard, MD  pantoprazole (PROTONIX) 40 MG tablet Take 40 mg by mouth daily.    [provider]  Polyvinyl Alcohol-Povidone PF (REFRESH) 1.4-0.6 % SOLN Place 1-2 drops into both eyes 3 (three) times daily as needed (dry/irritated eyes.).    [provider]  potassium chloride SA (KLOR-CON) 20 MEQ tablet Take 1 tablet (20 mEq total) by mouth every other day. 06/16/19   [name], Wonda Cheng, MD  prednisoLONE acetate (PRED FORTE) 1 % ophthalmic suspension Place 1 drop into both eyes 4 (four) times daily.  09/24/18   [provider]  pregabalin (LYRICA) 75 MG capsule TAKE 1 CAPSULE(75 MG) BY MOUTH TWICE DAILY 09/02/20   Glendale Chard, MD  sitaGLIPtin-metformin (JANUMET) 50-500 MG tablet Take 1 tablet by mouth 2 (two) times daily with a meal. 02/13/21   Ghumman, Ramandeep, NP  topiramate (TOPAMAX) 25 MG tablet Take 1 tablet (25 mg total) by mouth at bedtime. 11/15/20   Garvin Fila, MD  Travoprost, BAK Free, (TRAVATAN) 0.004 % SOLN ophthalmic solution Place 1 drop into the right eye at bedtime.  12/29/19   [provider]  umeclidinium-vilanterol (ANORO ELLIPTA) 62.5-25 MCG/ACT AEPB Inhale 1 puff into the lungs daily at 6 (six) AM. 02/19/21   Deneise Lever, MD      Allergies    Crestor [rosuvastatin calcium], Demerol  [meperidine hcl], and Shellfish allergy    Review of Systems   Review of Systems  Neurological:  Positive for weakness and headaches.  All other systems reviewed and are negative.  Physical Exam Updated Vital Signs BP 119/73    Pulse 78    Temp 98 F (36.7 C) (Oral)    Resp 13    Ht _0  (1.575 m)    Wt 78.9 kg    SpO2 96%    BMI 31.83  kg/m  Physical Exam Vitals and nursing note reviewed.  Constitutional:      General: She is not in acute distress.    Appearance: Normal appearance.     Comments: Well-appearing patient, no acute distress  HENT:     Head: Normocephalic and atraumatic.  Eyes:     General:        Right eye: No discharge.        Left eye: No discharge.  Cardiovascular:     Rate and Rhythm: Normal rate and regular rhythm.     Heart sounds: No murmur heard.   No friction rub. No gallop.     Comments: No tenderness palpation chest wall, normal rate, rhythm heard on exam.  No JVD noted. Pulmonary:     Effort: Pulmonary effort  is normal.     Breath sounds: Normal breath sounds.     Comments: No excessive breath sounds heard, decreased respiratory effort throughout, some fine crackles at lung bases secondary to chronic interstitial lung disease. Abdominal:     General: Bowel sounds are normal.     Palpations: Abdomen is soft.     Comments: No tenderness to palpation of abdomen.  No rebound, rigidity, guarding.  Normal bowel sounds throughout.  Skin:    General: Skin is warm and dry.     Capillary Refill: Capillary refill takes less than 2 seconds.  Neurological:     Mental Status: She is alert and oriented to person, place, and time.     Comments: CN2-12 grossly intact.  Intact strength 5 out of 5 bilateral upper and lower extremities.  Romberg negative, gait normal.  Alert and oriented x3.  Psychiatric:        Mood and Affect: Mood normal.        Behavior: Behavior normal.    ED Results / Procedures / Treatments   Labs (all labs ordered are listed, but only abnormal results are displayed) Labs Reviewed  CBC WITH DIFFERENTIAL/PLATELET - Abnormal; Notable for the following components:      Result Value   Hemoglobin 11.8 (*)    All other components within normal limits  COMPREHENSIVE METABOLIC PANEL - Abnormal; Notable for the following components:   Glucose, Bld 110 (*)    All other components  within normal limits  I-STAT CHEM 8, ED - Abnormal; Notable for the following components:   Potassium 3.0 (*)    Chloride 114 (*)    Creatinine, Ser 0.40 (*)    Calcium, Ion 0.79 (*)    TCO2 16 (*)    Hemoglobin 7.8 (*)    HCT 23.0 (*)    All other components within normal limits  TROPONIN I (HIGH SENSITIVITY)  TROPONIN I (HIGH SENSITIVITY)    EKG EKG Interpretation  Date/Time:  Tuesday February 26 2021 18:05:02 EST Ventricular Rate:  68 PR Interval:  147 QRS Duration: 77 QT Interval:  442 QTC Calculation: 471 R Axis:   38 Text Interpretation: Sinus rhythm No significant change since last tracing Confirmed by Wandra Arthurs [phone]) on 02/26/2021 6:43:27 PM  Radiology CT ANGIO HEAD NECK W WO CM  Result Date: 02/26/2021 CLINICAL DATA:  Provided history: Dizziness, persistent/recurrent, cardiac or vascular cause suspected. Additional history provided: Dizziness and generalized weakness. EXAM: CT ANGIOGRAPHY HEAD AND NECK TECHNIQUE: Multidetector CT imaging of the head and neck was performed using the standard protocol during bolus administration of intravenous contrast. Multiplanar CT image reconstructions and MIPs were obtained to evaluate the vascular anatomy. Carotid stenosis measurements (when applicable) are obtained utilizing NASCET criteria, using the distal internal carotid diameter as the denominator. RADIATION DOSE REDUCTION: This exam was performed according to the departmental dose-optimization program which includes automated exposure control, adjustment of the mA and/or kV according to patient size and/or use of iterative reconstruction technique. CONTRAST:  23m OMNIPAQUE IOHEXOL 350 MG/ML SOLN COMPARISON:  Brain MRI 04/17/2020.  Brain MRI 10/05/2018. FINDINGS: CT HEAD FINDINGS Brain: Mild generalized cerebral atrophy. Mild patchy and ill-defined hypoattenuation within the cerebral white matter, nonspecific but compatible with chronic small vessel ischemic disease. A known  chronic lacunar infarct within the left thalamus was better appreciated on the prior brain MRI of 10/05/2018 (acute at that time). There is no acute intracranial hemorrhage. No demarcated cortical infarct. No extra-axial fluid collection. No evidence of an  intracranial mass. No midline shift. Vascular: No hyperdense vessel.  Atherosclerotic calcifications. Skull: Normal. Negative for fracture or focal lesion. Sinuses: 14 mm mucous retention cyst within the left maxillary sinus. Orbits: Redemonstrated 3 mm metallic foreign body along the anterior aspect of the right globe. 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 Review of the MIP images confirms the above findings CTA NECK FINDINGS Aortic arch: Standard aortic branching. Atherosclerotic plaque within the visualized aortic arch and proximal major branch vessels of the neck. Streak and beam hardening artifact arising from a dense right-sided contrast bolus partially obscures the right subclavian artery. Within this limitation, there is no appreciable hemodynamically significant stenosis within the innominate or proximal subclavian arteries. Right carotid system: CCA and ICA patent within the neck without stenosis. Minimal atherosclerotic plaque about the carotid bifurcation. Left carotid system: CCA and ICA patent within the neck without stenosis. Minimal atherosclerotic plaque about the carotid bifurcation. Vertebral arteries: The vertebral arteries are codominant and patent within the neck without stenosis. Skeleton: Subcentimeter sclerotic focus within the T4 vertebral body, nonspecific but likely reflecting a bone island. No acute bony abnormality or aggressive osseous lesion. Cervicothoracic dextrocurvature. Other neck: No neck mass or cervical lymphadenopathy. Upper chest: Interstitial thickening and small nodules within the right upper lobe, on the prior more fully characterized on the recent prior chest CT of 02/18/2021. Background emphysema. Review of the MIP images  confirms the above findings CTA HEAD FINDINGS Anterior circulation: ICA calcified plaque within both vessels with no more than mild stenosis. Five The M1 middle cerebral arteries are patent. No M2 proximal branch occlusion or high-grade proximal stenosis is identified. The anterior cerebral arteries are patent. No intracranial aneurysm is identified. Posterior circulation: The intracranial vertebral arteries are patent. The basilar artery is patent. The posterior cerebral arteries are patent. Apparent moderate/severe stenosis within the P3 right posterior cerebral artery. Hypoplastic left P1 segment with sizable left posterior communicating artery. The right posterior communicating artery is diminutive or absent. Venous sinuses: Within the limitations of contrast timing, no convincing thrombus. Anatomic variants: As described. Review of the MIP images confirms the above findings IMPRESSION: CT head: 1. No evidence of acute intracranial abnormality. 2. Mild chronic small vessel ischemic changes within the cerebral white matter. 3. Known chronic lacunar infarct within the left thalamus. 4. Mild for age generalized cerebral atrophy. 5. Redemonstrated 3 mm metallic foreign body along the anterior aspect of the right globe. CTA neck: 1. The common carotid, internal carotid and vertebral arteries are patent within the neck without stenosis. Minimal atherosclerotic plaque about the carotid bifurcations, bilaterally. 2. Interstitial thickening and small nodules within the right upper lobe more fully characterized on the recent prior high resolution chest CT of 02/18/2021. Please refer to this prior report for further description and differential considerations. CTA head: 1. No intracranial proximal large vessel occlusion is identified. 2. Intracranial atherosclerotic disease, most notably as follows. 3. Moderate/severe stenosis within the P3 right posterior cerebral artery. 4. Atherosclerotic plaque within the intracranial  internal carotid arteries bilaterally, with no more than mild stenosis. Electronically Signed   By: Kellie Simmering D.O.   On: 02/26/2021 20:01   DG Chest Port 1 View  Result Date: 02/26/2021 CLINICAL DATA:  Shortness of breath EXAM: PORTABLE CHEST 1 VIEW COMPARISON:  12/30/2019 FINDINGS: Borderline heart size. Lungs clear. No effusions. No acute bony abnormality. IMPRESSION: No active disease. Electronically Signed   By: Rolm Baptise M.D.   On: 02/26/2021 18:34    Procedures Procedures    Medications  Ordered in ED Medications  metoCLOPramide (REGLAN) injection 10 mg (10 mg Intravenous Given 02/26/21 2059)  diphenhydrAMINE (BENADRYL) injection 25 mg (25 mg Intravenous Given 02/26/21 2057)  iohexol (OMNIPAQUE) 350 MG/ML injection 75 mL (75 mLs Intravenous Contrast Given 02/26/21 1926)    ED Course/ Medical Decision Making/ A&P                           Medical Decision Making Amount and/or Complexity of Data Reviewed Labs: ordered. Radiology: ordered. ECG/medicine tests: ordered.  Risk Prescription drug management.  I discussed this case with my attending physician who cosigned this note including patient's presenting symptoms, physical exam, and planned diagnostics and interventions. Attending physician stated agreement with plan or made changes to plan which were implemented.   Attending physician assessed patient at bedside.  Is a patient with significant past medical history, risk factors including interstitial lung disease, CAD, hypertension, diabetes who presents with concern for headache, feeling like she was going to pass out, weakness earlier today.  My differential diagnosis includes concern for subarachnoid hemorrhage versus dissection versus stroke versus syncope or near syncope event of a cardiac origin, dehydration versus other.  This is not an exhaustive differential.  Also considered ACS, electrolyte abnormality versus other.  Personally ordered and reviewed lab work  including i-STAT which had several abnormalities that were borne out to be nonrepresentative and CBC, CMP.  CBC significant for mild anemia with hemoglobin of 11.8.  CMP shows slightly elevated blood glucose of 110.  Otherwise stable electrolytes, blood counts.  Initial troponin of 4.  No chest pain throughout her work-up, minimal clinical concern for ACS at this time, without any evidence of chest pain, myocarditis, or other we will DC repeat troponin at this time.  Obtained chest x-ray which shows no acute intrathoracic abnormality.  I personally reviewed this and agree with radiologist interpretation.  Additionally obtain CT angiogram head and neck to evaluate for dissection, subarachnoid hemorrhage in the neck or brain.  There is some moderate to severe stenosis of T3, however no evidence of acute stroke, subarachnoid hemorrhage or other acute findings at this time.  No evidence of dissection.  I agree with the radiologist rotation.  Her EKG is significant for normal sinus rhythm.  No evidence of ectopy, or arrhythmia.  No evidence of WPW, LGL.  Discussed normal work-up with patient, encouraged rest, plenty of fluids, and close follow-up with PCP.  Patient discharged stable condition at this time, strict return precautions are given. Final Clinical Impression(s) / ED Diagnoses Final diagnoses:  Weakness  Tension-type headache, not intractable, unspecified chronicity pattern    Rx / DC Orders ED Discharge Orders     None         Dorien Chihuahua 02/26/21 2118    Drenda Freeze, MD 02/26/21 2328

## 2021-02-26 NOTE — Chronic Care Management (AMB) (Signed)
°  Care Management   Note  02/26/2021 Name: Sheila Reynolds MRN: [id number] DOB: 1946-01-16  Sheila Reynolds is a 76 y.o. year old female who is a primary care patient of Glendale Chard, MD and is actively engaged with the care management team. I reached out to [place] by phone today to assist with re-scheduling a follow up visit with the RN Case Manager  Follow up plan: Unsuccessful telephone outreach attempt made. A HIPAA compliant phone message was left for the patient providing contact information and requesting a return call.  The care management team will reach out to the patient again over the next 7 days.  If patient returns call to provider office, please advise to call [place] at [phone].  [place] Management  Direct Dial: [phone]

## 2021-02-26 NOTE — ED Notes (Signed)
Patient verbalizes understanding of discharge instructions. Opportunity for questioning and answers were provided. Armband removed by staff, pt discharged from ED via wheelchair.  

## 2021-02-26 NOTE — Discharge Instructions (Addendum)
As we discussed we saw no evidence of a stroke, hemorrhage, or other abnormalities in your brain.  Our evaluation of your heart did not show any evidence of a heart attack, or arrhythmia that could have caused your feeling of lightheadedness today.  It is unclear what caused this, I recommend that you follow-up with your PCP, cardiologist at this time for further evaluation.  You can take ibuprofen, Tylenol, and encourage plenty fluids for the headache.  Please return to the emergency department if your symptoms return, worsen.  It was a pleasure taking care of you today, I hope that you feel better soon.

## 2021-03-05 NOTE — Chronic Care Management (AMB) (Signed)
°  Care Management   Note  03/05/2021 Name: Sheila Reynolds MRN: [id number] DOB: March 11, 1945  Sheila Reynolds is a 76 y.o. year old female who is a primary care patient of Glendale Chard, MD and is actively engaged with the care management team. I reached out to [place] by phone today to assist with re-scheduling a follow up visit with the RN Case Manager  Follow up plan: Telephone appointment with care management team member scheduled for:04/03/21  [place] Management  Direct Dial: [phone]

## 2021-03-11 ENCOUNTER — Ambulatory Visit
Admission: RE | Admit: 2021-03-11 | Discharge: 2021-03-11 | Disposition: A | Discharge status: Home / Self Care | Payer: Medicare Other | Source: Ambulatory Visit | Attending: Nurse Practitioner | Admitting: Nurse Practitioner

## 2021-03-11 ENCOUNTER — Ambulatory Visit (INDEPENDENT_AMBULATORY_CARE_PROVIDER_SITE_OTHER): Payer: Medicare Other | Admitting: Nurse Practitioner

## 2021-03-11 ENCOUNTER — Other Ambulatory Visit: Payer: Self-pay

## 2021-03-11 ENCOUNTER — Other Ambulatory Visit (HOSPITAL_BASED_OUTPATIENT_CLINIC_OR_DEPARTMENT_OTHER): Payer: Medicare Other | Admitting: Internal Medicine

## 2021-03-11 ENCOUNTER — Ambulatory Visit: Payer: Medicare Other | Admitting: Adult Health

## 2021-03-11 VITALS — BP 110/70 | HR 79 | Temp 98.7°F

## 2021-03-11 DIAGNOSIS — E1159 Type 2 diabetes mellitus with other circulatory complications: Secondary | ICD-10-CM

## 2021-03-11 DIAGNOSIS — R059 Cough, unspecified: Secondary | ICD-10-CM | POA: Diagnosis not present

## 2021-03-11 DIAGNOSIS — R051 Acute cough: Secondary | ICD-10-CM | POA: Diagnosis not present

## 2021-03-11 DIAGNOSIS — R0602 Shortness of breath: Secondary | ICD-10-CM | POA: Diagnosis not present

## 2021-03-11 LAB — POC INFLUENZA A&B (BINAX/QUICKVUE)
Influenza A, POC: NEGATIVE
Influenza B, POC: NEGATIVE

## 2021-03-11 LAB — POC COVID19 BINAXNOW: SARS Coronavirus 2 Ag: NEGATIVE

## 2021-03-11 MED ORDER — AZITHROMYCIN 250 MG PO TABS: ORAL_TABLET | ORAL | 0 refills | Status: AC

## 2021-03-11 MED ORDER — PREDNISONE 20 MG PO TABS: ORAL_TABLET | ORAL | 0 refills | Status: DC

## 2021-03-11 NOTE — Progress Notes (Signed)
I,Sheila Reynolds,acting as a Education administrator for [employer], FNP.,have documented all relevant documentation on the behalf of Sheila Brine, FNP,as directed by  Sheila Brine, FNP while in the presence of Sheila Reynolds, [place].  This visit occurred during the SARS-CoV-2 public health emergency.  Safety protocols were in place, including screening questions prior to the visit, additional usage of staff PPE, and extensive cleaning of exam room while observing appropriate contact time as indicated for disinfecting solutions.  Subjective:     Patient ID: Sheila Reynolds , female    DOB: May 29, 1945 , 76 y.o.   MRN: [id number]   Chief Complaint  Patient presents with   Cough    HPI  Patient presents today for treatment of cough. She has had symptoms since Wednesday, she had been feeling bad prior to this. She has night sweats. When she coughs she will have brown secretions. She is seen outside at her car  Cough This is a new problem. The current episode started 1 to 4 weeks ago. The problem has been gradually worsening. The cough is Productive of sputum. Associated symptoms include a fever, headaches and a sore throat. Her past medical history is significant for COPD. There is no history of asthma.    Past Medical History:  Diagnosis Date   Anemia    3 months ago anemic   Anxiety    on meds   Arthritis    "all over" (01/15/2017)   Asthma    Bronchitis with emphysema    Chest pain    Chronic bronchitis ([place])    Coronary artery disease    a. 01/2017 she underwent orbital atherectomy/DES to the proxmal LAD and PTCA to ostial D2. 2D Echo 01/15/17 showed mild LVH, EF 60-65%, grade 1 DD.   Family history of anesthesia complication    daughter N/V   Fibromyalgia    GERD (gastroesophageal reflux disease)    on meds   Glaucoma    Heart murmur    History of hiatal hernia    Hx of echocardiogram    Echo (03/2013):  Tech limited; Mild focal basal septal hypertrophy, EF 60-65%, normal RVF    Hyperlipidemia    Hypertension    Nonsustained ventricular tachycardia    OSA on CPAP    Premature atrial contractions    PVC (premature ventricular contraction)    a. Holter 12/16: NSR, occ PAC,PVCs   Sarcoidosis    Type II diabetes mellitus ([place])      Family History  Problem Relation Age of Onset   Heart disease Father    Diabetes Father    Glaucoma Father    Cancer Mother        unknown type, ?lung   Heart disease Paternal Grandmother    Cancer Paternal Grandmother        unknown type   Diabetes Brother    Glaucoma Brother      Current Outpatient Medications:    acetaminophen (TYLENOL) 500 MG tablet, Take 1,000 mg by mouth 2 (two) times daily as needed for moderate pain or headache., Disp: , Rfl:    albuterol (VENTOLIN HFA) 108 (90 Base) MCG/ACT inhaler, Inhale 2 puffs into the lungs every 6 (six) hours as needed for wheezing or shortness of breath., Disp: 18 g, Rfl: 12   aspirin EC 81 MG tablet, Take 81 mg by mouth daily. Swallow whole., Disp: , Rfl:    atorvastatin (LIPITOR) 80 MG tablet, Take 1 tablet (80 mg total) by mouth daily., Disp: 90 tablet,  Rfl: 3   azelastine (ASTELIN) 0.1 % nasal spray, Place 1 spray into both nostrils 2 (two) times daily. Use in each nostril as directed, Disp: 30 mL, Rfl: 1   Blood Glucose Monitoring Suppl (ACCU-CHEK AVIVA PLUS) w/Device KIT, Use to check blood sugars 3 times a day. Dx code e11.65, Disp: 1 kit, Rfl: 3   brimonidine (ALPHAGAN P) 0.1 % SOLN, Place 1 drop into both eyes 2 (two) times daily. , Disp: , Rfl:    brinzolamide (AZOPT) 1 % ophthalmic suspension, Place 1 drop into both eyes 3 (three) times daily., Disp: , Rfl:    carvedilol (COREG) 12.5 MG tablet, TAKE 1 TABLET(12.5 MG) BY MOUTH TWICE DAILY, Disp: 180 tablet, Rfl: 2   clopidogrel (PLAVIX) 75 MG tablet, Take 1 tablet (75 mg total) by mouth daily., Disp: 90 tablet, Rfl: 1   EPINEPHrine 0.3 mg/0.3 mL IJ SOAJ injection, Inject 0.3 mg into the muscle as needed for anaphylaxis.,  Disp: 1 each, Rfl: 0   ezetimibe (ZETIA) 10 MG tablet, Take 1 tablet (10 mg total) by mouth daily., Disp: 90 tablet, Rfl: 3   furosemide (LASIX) 40 MG tablet, Take 1 tablet (40 mg total) by mouth every other day., Disp: 90 tablet, Rfl: 1   isosorbide mononitrate (IMDUR) 60 MG 24 hr tablet, TAKE 1 TABLET(60 MG) BY MOUTH DAILY, Disp: 90 tablet, Rfl: 2   ketorolac (ACULAR) 0.5 % ophthalmic solution, Place 1 drop into the right eye 3 (three) times daily., Disp: , Rfl:    Lancets (ONETOUCH DELICA PLUS [id number]) MISC, CHECK BLOOD SUGAR BEFORE BREAKFAST AND DINNER, Disp: 100 each, Rfl: 1   Nebulizers (COMPRESSOR/NEBULIZER) MISC, Use as directed, Disp: 1 each, Rfl: 0   pantoprazole (PROTONIX) 40 MG tablet, Take 40 mg by mouth daily., Disp: , Rfl:    Polyvinyl Alcohol-Povidone PF (REFRESH) 1.4-0.6 % SOLN, Place 1-2 drops into both eyes 3 (three) times daily as needed (dry/irritated eyes.)., Disp: , Rfl:    potassium chloride SA (KLOR-CON) 20 MEQ tablet, Take 1 tablet (20 mEq total) by mouth every other day., Disp: 90 tablet, Rfl: 1   prednisoLONE acetate (PRED FORTE) 1 % ophthalmic suspension, Place 1 drop into both eyes 4 (four) times daily. , Disp: , Rfl:    predniSONE (DELTASONE) 20 MG tablet, Take 2 tablets by mouth daily x 3 days, Disp: 6 tablet, Rfl: 0   sitaGLIPtin-metformin (JANUMET) 50-500 MG tablet, Take 1 tablet by mouth 2 (two) times daily with a meal., Disp: 180 tablet, Rfl: 1   topiramate (TOPAMAX) 25 MG tablet, Take 1 tablet (25 mg total) by mouth at bedtime., Disp: 120 tablet, Rfl: 3   Travoprost, BAK Free, (TRAVATAN) 0.004 % SOLN ophthalmic solution, Place 1 drop into the right eye at bedtime. , Disp: , Rfl:    umeclidinium-vilanterol (ANORO ELLIPTA) 62.5-25 MCG/ACT AEPB, Inhale 1 puff into the lungs daily at 6 (six) AM., Disp: 1 each, Rfl: 12   albuterol (PROVENTIL) (2.5 MG/3ML) 0.083% nebulizer solution, Take 3 mLs (2.5 mg total) by nebulization daily as needed for wheezing or shortness of  breath., Disp: 75 mL, Rfl: 12   benzonatate (TESSALON) 200 MG capsule, Take 1 capsule (200 mg total) by mouth 3 (three) times daily as needed for cough., Disp: 30 capsule, Rfl: 2   montelukast (SINGULAIR) 10 MG tablet, TAKE 1 TABLET BY MOUTH EVERY DAY, Disp: 90 tablet, Rfl: 1   nitroGLYCERIN (NITROSTAT) 0.4 MG SL tablet, Place 1 tablet (0.4 mg total) under the tongue every 5 (  five) minutes as needed for chest pain., Disp: 25 tablet, Rfl: prn   pregabalin (LYRICA) 75 MG capsule, TAKE 1 CAPSULE(75 MG) BY MOUTH TWICE DAILY, Disp: 180 capsule, Rfl: 2   Allergies  Allergen Reactions   Crestor [Rosuvastatin Calcium] Other (See Comments)    muscle aches   Demerol  [Meperidine Hcl]     Other reaction(s): Hallucinations   Shellfish Allergy Itching and Other (See Comments)    Crab, shrimp and lobster ---lips itch and tingle Was told not to eat again after having a allergy test. Lobster, crab and shrimp      Review of Systems  Constitutional:  Positive for fever.  HENT:  Positive for congestion and sore throat.   Respiratory:  Positive for cough.   Neurological:  Positive for headaches.    Today's Vitals   03/11/21 1428  BP: 110/70  Pulse: 79  Temp: 98.7 F (37.1 C)  TempSrc: Oral   There is no height or weight on file to calculate BMI.   Objective:  Physical Exam Constitutional:      General: She is not in acute distress.    Appearance: Normal appearance. She is well-developed. She is obese.  Cardiovascular:     Rate and Rhythm: Normal rate and regular rhythm.     Pulses: Normal pulses.     Heart sounds: Normal heart sounds. No murmur heard. Pulmonary:     Effort: Pulmonary effort is normal. No respiratory distress.     Breath sounds: No wheezing.     Comments: Diminished breath sounds bilateral bases. Chest:     Chest wall: No tenderness.  Musculoskeletal:        General: Normal range of motion.  Skin:    General: Skin is warm and dry.     Capillary Refill: Capillary  refill takes less than 2 seconds.  Neurological:     General: No focal deficit present.     Mental Status: She is alert and oriented to person, place, and time.  Psychiatric:        Mood and Affect: Mood normal.        Behavior: Behavior normal.        Thought Content: Thought content normal.        Judgment: Judgment normal.        Assessment And Plan:     1. Acute cough Comments: Rapid covid, influenza A/B all negative, will send PCR. Will treat with zpack and prednisone short course due to 7 day history of symptoms - POC Influenza A&B (Binax test) - POC COVID-19 - Novel Coronavirus, NAA ([facility]) - DG Chest 2 View; Future - azithromycin (ZITHROMAX) 250 MG tablet; Take 2 tablets (500 mg) on  Day 1,  followed by 1 tablet (250 mg) once daily on Days 2 through 5.  Dispense: 6 each; Refill: 0 - predniSONE (DELTASONE) 20 MG tablet; Take 2 tablets by mouth daily x 3 days  Dispense: 6 tablet; Refill: 0  2. Type 2 diabetes mellitus with other circulatory complication, without long-term current use of insulin (HCC) Comments: Advised to monitor blood sugar with taking prednisone     Patient was given opportunity to ask questions. Patient verbalized understanding of the plan and was able to repeat key elements of the plan. All questions were answered to their satisfaction.  Sheila Brine, FNP   I, Sheila Brine, FNP, have reviewed all documentation for this visit. The documentation on 03/11/21 for the exam, diagnosis, procedures, and orders are all accurate and complete.  IF YOU HAVE BEEN REFERRED TO A SPECIALIST, IT MAY TAKE 1-2 WEEKS TO SCHEDULE/PROCESS THE REFERRAL. IF YOU HAVE NOT HEARD FROM US/SPECIALIST IN TWO WEEKS, PLEASE GIVE [place] A CALL AT [phone].   THE PATIENT IS ENCOURAGED TO PRACTICE SOCIAL DISTANCING DUE TO THE COVID-19 PANDEMIC.

## 2021-03-11 NOTE — Patient Instructions (Signed)

## 2021-03-12 LAB — SARS-COV-2, NAA 2 DAY TAT

## 2021-03-12 LAB — NOVEL CORONAVIRUS, NAA: SARS-CoV-2, NAA: NOT DETECTED

## 2021-03-24 ENCOUNTER — Other Ambulatory Visit: Payer: Self-pay | Admitting: Internal Medicine

## 2021-03-27 NOTE — Assessment & Plan Note (Signed)
Continue CPAP, awaiting replacement with change to AutoPap 5-15

## 2021-03-27 NOTE — Assessment & Plan Note (Signed)
Dyspnea and peripheral edema.  Ordering CT to exclude PE Elevate legs when sitting

## 2021-03-27 NOTE — Assessment & Plan Note (Addendum)
Nonspecific exacerbation Plan-refill Anoro.  CT chest with and without contrast trying to assess possibility of PE but also look for progressive interstitial disease.

## 2021-03-27 NOTE — Assessment & Plan Note (Signed)
Small area on left calf might be sarcoid skin lesion.  It is small and pretty nonspecific so we will watch it.

## 2021-03-27 NOTE — Assessment & Plan Note (Signed)
Updating CT chest

## 2021-03-30 ENCOUNTER — Other Ambulatory Visit: Payer: Self-pay | Admitting: Internal Medicine

## 2021-04-01 ENCOUNTER — Telehealth: Payer: Self-pay

## 2021-04-01 ENCOUNTER — Other Ambulatory Visit: Payer: Self-pay | Admitting: Nurse Practitioner

## 2021-04-01 DIAGNOSIS — J449 Chronic obstructive pulmonary disease, unspecified: Secondary | ICD-10-CM

## 2021-04-01 MED ORDER — BENZONATATE 200 MG PO CAPS: 200.0000 mg | ORAL_CAPSULE | Freq: Three times a day (TID) | ORAL | 2 refills | Status: DC | PRN

## 2021-04-01 MED ORDER — TRIAMCINOLONE ACETONIDE 40 MG/ML IJ SUSP
60.0000 mg | Freq: Once | INTRAMUSCULAR | Status: AC
  Administered 2021-04-02: 60 mg via INTRAMUSCULAR

## 2021-04-01 MED ORDER — ALBUTEROL SULFATE (2.5 MG/3ML) 0.083% IN NEBU: 2.5000 mg | INHALATION_SOLUTION | Freq: Every day | RESPIRATORY_TRACT | 12 refills | Status: DC | PRN

## 2021-04-01 NOTE — Progress Notes (Signed)
Returned call to patient continues to have coughing with clear secretions, CXR was negative for infection however showed COPD. I have sent refill for her albuterol nebulizer and tessalon perles. She will come in tomorrow for Kenalog injection since having soreness to her chest. If she does not improve after this treatment I recommend she follow up with Dr. Annamaria Boots (pulmonology) this sounds like a COPD exacerbation.

## 2021-04-01 NOTE — Chronic Care Management (AMB) (Signed)
° ° °  Called Sheila Reynolds, No answer, Voicemail full, unable to leave message of appointment on 04-05-2021 at 11:00 via telephone visit with Orlando Penner, Pharm D.   Care Gaps: Shingrix overdue Covid booster overdue Yearly Urine micro overdue Pneumonia vaccine overdue AWV 04-04-2021  Star Rating Drug: Atorvastatin 80 mg- last filled 03-20-2021 90 DS [facility] Janumet 50-500 mg- Last filled 02-20-2021 90 DS [facility]  Any gaps in medications fill history? No  [place] Pharmacist Assistant [phone]

## 2021-04-02 ENCOUNTER — Other Ambulatory Visit: Payer: Self-pay

## 2021-04-02 ENCOUNTER — Ambulatory Visit (INDEPENDENT_AMBULATORY_CARE_PROVIDER_SITE_OTHER): Payer: Medicare Other

## 2021-04-02 VITALS — BP 136/80 | HR 75 | Temp 98.4°F | Ht 62.0 in

## 2021-04-02 DIAGNOSIS — J441 Chronic obstructive pulmonary disease with (acute) exacerbation: Secondary | ICD-10-CM

## 2021-04-02 DIAGNOSIS — J449 Chronic obstructive pulmonary disease, unspecified: Secondary | ICD-10-CM

## 2021-04-02 NOTE — Progress Notes (Signed)
The patient was given 60mg  of kenalog that was ordered by Laurance Flatten, DNP, FNP-BC to help with her cough.

## 2021-04-03 ENCOUNTER — Encounter: Payer: Self-pay | Admitting: Nurse Practitioner

## 2021-04-03 ENCOUNTER — Telehealth: Payer: Medicare Other

## 2021-04-03 ENCOUNTER — Telehealth: Payer: Self-pay

## 2021-04-03 NOTE — Telephone Encounter (Signed)
°  Care Management   Follow Up Note   04/03/2021 Name: Sheila Reynolds MRN: [id number] DOB: 06/19/1945   Referred by: Glendale Chard, MD Reason for referral : Chronic Care Management (RN CM Follow up call )   An unsuccessful telephone outreach was attempted today. The patient was referred to the case management team for assistance with care management and care coordination.   Follow Up Plan: Telephone follow up appointment with care management team member scheduled for: 04/11/21   Barb Merino, RN, BSN, CCM Care Management Coordinator [place] [facility]  Direct Phone: [phone]

## 2021-04-04 ENCOUNTER — Ambulatory Visit (INDEPENDENT_AMBULATORY_CARE_PROVIDER_SITE_OTHER): Payer: Medicare Other

## 2021-04-04 ENCOUNTER — Encounter: Payer: Self-pay | Admitting: Internal Medicine

## 2021-04-04 ENCOUNTER — Ambulatory Visit (INDEPENDENT_AMBULATORY_CARE_PROVIDER_SITE_OTHER): Payer: Medicare Other | Admitting: Internal Medicine

## 2021-04-04 ENCOUNTER — Other Ambulatory Visit: Payer: Self-pay

## 2021-04-04 VITALS — BP 132/64 | HR 74 | Temp 98.2°F | Ht 61.6 in | Wt 173.5 lb

## 2021-04-04 VITALS — BP 132/64 | HR 74 | Temp 98.2°F | Ht 61.6 in | Wt 173.4 lb

## 2021-04-04 DIAGNOSIS — Z0000 Encounter for general adult medical examination without abnormal findings: Secondary | ICD-10-CM | POA: Diagnosis not present

## 2021-04-04 DIAGNOSIS — I25119 Atherosclerotic heart disease of native coronary artery with unspecified angina pectoris: Secondary | ICD-10-CM | POA: Diagnosis not present

## 2021-04-04 DIAGNOSIS — Z6832 Body mass index (BMI) 32.0-32.9, adult: Secondary | ICD-10-CM

## 2021-04-04 DIAGNOSIS — R8290 Unspecified abnormal findings in urine: Secondary | ICD-10-CM | POA: Diagnosis not present

## 2021-04-04 DIAGNOSIS — I700 Atherosclerosis of aorta: Secondary | ICD-10-CM

## 2021-04-04 DIAGNOSIS — E119 Type 2 diabetes mellitus without complications: Secondary | ICD-10-CM

## 2021-04-04 DIAGNOSIS — F329 Major depressive disorder, single episode, unspecified: Secondary | ICD-10-CM | POA: Diagnosis not present

## 2021-04-04 DIAGNOSIS — I5032 Chronic diastolic (congestive) heart failure: Secondary | ICD-10-CM

## 2021-04-04 DIAGNOSIS — E6609 Other obesity due to excess calories: Secondary | ICD-10-CM

## 2021-04-04 DIAGNOSIS — I110 Hypertensive heart disease with heart failure: Secondary | ICD-10-CM | POA: Diagnosis not present

## 2021-04-04 LAB — POCT URINALYSIS DIPSTICK
Bilirubin, UA: NEGATIVE
Glucose, UA: NEGATIVE
Nitrite, UA: POSITIVE
Protein, UA: NEGATIVE
Spec Grav, UA: 1.025 (ref 1.010–1.025)
Urobilinogen, UA: 0.2 E.U./dL
pH, UA: 5.5 (ref 5.0–8.0)

## 2021-04-04 NOTE — Addendum Note (Signed)
Addended by: Kellie Simmering on: 04/04/2021 11:56 AM   Modules accepted: Orders

## 2021-04-04 NOTE — Progress Notes (Signed)
This visit occurred during the SARS-CoV-2 public health emergency.  Safety protocols were in place, including screening questions prior to the visit, additional usage of staff PPE, and extensive cleaning of exam room while observing appropriate contact time as indicated for disinfecting solutions.  Subjective:   Sheila Reynolds is a 76 y.o. female who presents for Medicare Annual (Subsequent) preventive examination.  Review of Systems     Cardiac Risk Factors include: advanced age (>39mn, >>31women);diabetes mellitus;hypertension;dyslipidemia;obesity (BMI >30kg/m2)     Objective:    Today's Vitals   04/04/21 1015 04/04/21 1022  BP: 132/64   Pulse: 74   Temp: 98.2 F (36.8 C)   TempSrc: Oral   SpO2: 99%   Weight: 173 lb 6.4 oz (78.7 kg)   Height: 5' 1.6" (1.565 m)   PainSc:  4    Body mass index is 32.13 kg/m.  Advanced Directives 04/04/2021 02/26/2021 10/08/2020 09/03/2020 06/18/2020 03/28/2020 01/10/2020  Does Patient Have a Medical Advance Directive? Yes Yes Yes Yes No Yes No  Type of AParamed[place]of [place]ount [place] will Healthcare Power of [place]Living will [place]Living will - [place]um[place]Living will -  Copy of HPencein Chart? No - copy requested - - - - No - copy requested -  Would patient like information on creating a medical advance directive? - - No - Patient declined - No - Patient declined - No - Patient declined  Pre-existing out of facility DNR order (yellow form or pink MOST form) - - - - - - -    Current Medications (verified) Outpatient Encounter Medications as of 04/04/2021  Medication Sig   acetaminophen (TYLENOL) 500 MG tablet Take 1,000 mg by mouth 2 (two) times daily as needed for moderate pain or headache.   albuterol (PROVENTIL) (2.5 MG/3ML) 0.083% nebulizer solution Take 3 mLs (2.5 mg total) by nebulization daily as needed for wheezing or  shortness of breath.   albuterol (VENTOLIN HFA) 108 (90 Base) MCG/ACT inhaler Inhale 2 puffs into the lungs every 6 (six) hours as needed for wheezing or shortness of breath.   aspirin EC 81 MG tablet Take 81 mg by mouth daily. Swallow whole.   azelastine (ASTELIN) 0.1 % nasal spray Place 1 spray into both nostrils 2 (two) times daily. Use in each nostril as directed   benzonatate (TESSALON) 200 MG capsule Take 1 capsule (200 mg total) by mouth 3 (three) times daily as needed for cough.   Blood Glucose Monitoring Suppl (ACCU-CHEK AVIVA PLUS) w/Device KIT Use to check blood sugars 3 times a day. Dx code e11.65   brimonidine (ALPHAGAN P) 0.1 % SOLN Place 1 drop into both eyes 2 (two) times daily.    brinzolamide (AZOPT) 1 % ophthalmic suspension Place 1 drop into both eyes 3 (three) times daily.   carvedilol (COREG) 12.5 MG tablet TAKE 1 TABLET(12.5 MG) BY MOUTH TWICE DAILY   clopidogrel (PLAVIX) 75 MG tablet Take 1 tablet (75 mg total) by mouth daily.   EPINEPHrine 0.3 mg/0.3 mL IJ SOAJ injection Inject 0.3 mg into the muscle as needed for anaphylaxis.   ezetimibe (ZETIA) 10 MG tablet Take 1 tablet (10 mg total) by mouth daily.   furosemide (LASIX) 40 MG tablet Take 1 tablet (40 mg total) by mouth every other day.   isosorbide mononitrate (IMDUR) 60 MG 24 hr tablet TAKE 1 TABLET(60 MG) BY MOUTH DAILY   ketorolac (ACULAR) 0.5 % ophthalmic solution  Place 1 drop into the right eye 3 (three) times daily.   Lancets (ONETOUCH DELICA PLUS [id number]) MISC CHECK BLOOD SUGAR BEFORE BREAKFAST AND DINNER   montelukast (SINGULAIR) 10 MG tablet TAKE 1 TABLET BY MOUTH EVERY DAY   Nebulizers (COMPRESSOR/NEBULIZER) MISC Use as directed   pantoprazole (PROTONIX) 40 MG tablet Take 40 mg by mouth daily.   Polyvinyl Alcohol-Povidone PF (REFRESH) 1.4-0.6 % SOLN Place 1-2 drops into both eyes 3 (three) times daily as needed (dry/irritated eyes.).   potassium chloride SA (KLOR-CON) 20 MEQ tablet Take 1 tablet (20 mEq  total) by mouth every other day.   prednisoLONE acetate (PRED FORTE) 1 % ophthalmic suspension Place 1 drop into both eyes 4 (four) times daily.    predniSONE (DELTASONE) 20 MG tablet Take 2 tablets by mouth daily x 3 days   pregabalin (LYRICA) 75 MG capsule TAKE 1 CAPSULE(75 MG) BY MOUTH TWICE DAILY   sitaGLIPtin-metformin (JANUMET) 50-500 MG tablet Take 1 tablet by mouth 2 (two) times daily with a meal.   umeclidinium-vilanterol (ANORO ELLIPTA) 62.5-25 MCG/ACT AEPB Inhale 1 puff into the lungs daily at 6 (six) AM.   atorvastatin (LIPITOR) 80 MG tablet Take 1 tablet (80 mg total) by mouth daily. (Patient not taking: Reported on 04/04/2021)   nitroGLYCERIN (NITROSTAT) 0.4 MG SL tablet Place 1 tablet (0.4 mg total) under the tongue every 5 (five) minutes as needed for chest pain.   topiramate (TOPAMAX) 25 MG tablet Take 1 tablet (25 mg total) by mouth at bedtime. (Patient not taking: Reported on 04/04/2021)   Travoprost, BAK Free, (TRAVATAN) 0.004 % SOLN ophthalmic solution Place 1 drop into the right eye at bedtime.  (Patient not taking: Reported on 04/04/2021)   No facility-administered encounter medications on file as of 04/04/2021.    Allergies (verified) Crestor [rosuvastatin calcium], Demerol  [meperidine hcl], and Shellfish allergy   History: Past Medical History:  Diagnosis Date   Anemia    3 months ago anemic   Anxiety    on meds   Arthritis    "all over" (01/15/2017)   Asthma    Bronchitis with emphysema    Chest pain    Chronic bronchitis ([place])    Coronary artery disease    a. 01/2017 she underwent orbital atherectomy/DES to the proxmal LAD and PTCA to ostial D2. 2D Echo 01/15/17 showed mild LVH, EF 60-65%, grade 1 DD.   Family history of anesthesia complication    daughter N/V   Fibromyalgia    GERD (gastroesophageal reflux disease)    on meds   Glaucoma    Heart murmur    History of hiatal hernia    Hx of echocardiogram    Echo (03/2013):  Tech limited; Mild focal basal  septal hypertrophy, EF 60-65%, normal RVF   Hyperlipidemia    Hypertension    Nonsustained ventricular tachycardia    OSA on CPAP    Premature atrial contractions    PVC (premature ventricular contraction)    a. Holter 12/16: NSR, occ PAC,PVCs   Sarcoidosis    Type II diabetes mellitus ([place])    Past Surgical History:  Procedure Laterality Date   BREAST SURGERY     CARDIAC CATHETERIZATION  05/04/2007   reveals overall normal left ventricular systolic function. Ejection fraction 65-70%   CATARACT EXTRACTION W/PHACO Right 07/20/2013   Procedure: CATARACT EXTRACTION PHACO AND INTRAOCULAR LENS PLACEMENT (IOC);  Surgeon: Marylynn Pearson, MD;  Location: [place];  Service: Ophthalmology;  Laterality: Right;   COLONOSCOPY W/ BIOPSIES  AND POLYPECTOMY     COLONOSCOPY WITH PROPOFOL N/A 09/07/2014   Procedure: COLONOSCOPY WITH PROPOFOL;  Surgeon: Juanita Craver, MD;  Location: [facility];  Service: Endoscopy;  Laterality: N/A;   COLONOSCOPY WITH PROPOFOL N/A 01/10/2020   Procedure: COLONOSCOPY WITH PROPOFOL;  Surgeon: Juanita Craver, MD;  [facility];  Service: Endoscopy;  Laterality: N/A;   CORONARY ANGIOPLASTY WITH STENT PLACEMENT  01/15/2017   CORONARY ATHERECTOMY N/A 01/15/2017   Procedure: CORONARY ATHERECTOMY;  Surgeon: Nelva Bush, MD;  Location: [place] [facility];  Service: Cardiovascular;  Laterality: N/A;   CORONARY BALLOON ANGIOPLASTY N/A 01/15/2017   Procedure: CORONARY BALLOON ANGIOPLASTY;  Surgeon: Nelva Bush, MD;  Location: [place] [facility];  Service: Cardiovascular;  Laterality: N/A;   CORONARY STENT INTERVENTION N/A 01/15/2017   Procedure: CORONARY STENT INTERVENTION;  Surgeon: Nelva Bush, MD;  Location: [place] [facility];  Service: Cardiovascular;  Laterality: N/A;   ESOPHAGOGASTRODUODENOSCOPY (EGD) WITH PROPOFOL N/A 09/07/2014   Procedure: ESOPHAGOGASTRODUODENOSCOPY (EGD) WITH PROPOFOL;  Surgeon: Juanita Craver, MD;  [facility];  Service: Endoscopy;   Laterality: N/A;   EXTERNAL EAR SURGERY Bilateral 1970s   tumors removed   EYE SURGERY Left 2019   cataract extraction    EYE SURGERY Right 02/09/2018   eyelid surgery    INTRAVASCULAR PRESSURE WIRE/FFR STUDY N/A 12/12/2016   Procedure: INTRAVASCULAR PRESSURE WIRE/FFR STUDY;  Surgeon: Nelva Bush, MD;  Location: [place] [facility];  Service: Cardiovascular;  Laterality: N/A;   MINI SHUNT INSERTION Right 07/20/2013   Procedure: INSERTION OF GLAUCOMA FILTRATION DEVICE RIGHT EYE;  Surgeon: Marylynn Pearson, MD;  Location: [place];  Service: Ophthalmology;  Laterality: Right;   MITOMYCIN C APPLICATION Right 05/03/5571   Procedure: MITOMYCIN C APPLICATION;  Surgeon: Marylynn Pearson, MD;  Location: [place];  Service: Ophthalmology;  Laterality: Right;   MITOMYCIN C APPLICATION Right 04/01/2540   Procedure: MITOMYCIN C APPLICATION RIGHT EYE;  Surgeon: Marylynn Pearson, MD;  Location: [place];  Service: Ophthalmology;  Laterality: Right;   PLACEMENT OF BREAST IMPLANTS Bilateral 1992   "took all my breast tissue out; put implants in;fibrocystic breast disease "   POLYPECTOMY  01/10/2020   Procedure: POLYPECTOMY;  Surgeon: Juanita Craver, MD;  [facility];  Service: Endoscopy;;   RIGHT/LEFT HEART CATH AND CORONARY ANGIOGRAPHY N/A 12/12/2016   Procedure: RIGHT/LEFT HEART CATH AND CORONARY ANGIOGRAPHY;  Surgeon: Nelva Bush, MD;  Location: [place] [facility];  Service: Cardiovascular;  Laterality: N/A;   TONSILLECTOMY     TOTAL ABDOMINAL HYSTERECTOMY  1982   TRABECULECTOMY Right 02/21/2015   Procedure: TRABECULECTOMY WITH [facility] ON THE RIGHT EYE;  Surgeon: Marylynn Pearson, MD;  Location: [place];  Service: Ophthalmology;  Laterality: Right;   Family History  Problem Relation Age of Onset   Heart disease Father    Diabetes Father    Glaucoma Father    Cancer Mother        unknown type, ?lung   Heart disease Paternal Grandmother    Cancer Paternal Grandmother        unknown type   Diabetes Brother     Glaucoma Brother    Social History   Socioeconomic History   Marital status: Married    Spouse name: Gerald Stabs   Number of children: 2   Years of education: Not on file   Highest education level: Not on file  Occupational History   Occupation: unemployed    Employer: [employer]  Tobacco Use   Smoking status: Former  Packs/day: 1.00    Years: 4.00    Pack years: 4.00    Types: Cigarettes    Quit date: 02/11/1980    Years since quitting: 41.1   Smokeless tobacco: Never  Vaping Use   Vaping Use: Never used  Substance and Sexual Activity   Alcohol use: No   Drug use: No   Sexual activity: Yes  Other Topics Concern   Not on file  Social History Narrative   Lives with husband   Right hand   Drinks 2-3 cups caffeine daily   Social Determinants of Health   Financial Resource Strain: Low Risk    Difficulty of Paying Living Expenses: Not hard at all  Food Insecurity: No Food Insecurity   Worried About Charity fundraiser in the Last Year: Never true   Ran Out of Food in the Last Year: Never true  Transportation Needs: No Transportation Needs   Lack of Transportation (Medical): No   Lack of Transportation (Non-Medical): No  Physical Activity: Inactive   Days of Exercise per Week: 0 days   Minutes of Exercise per Session: 0 min  Stress: No Stress Concern Present   Feeling of Stress : Only a little  Social Connections: Not on file    Tobacco Counseling Counseling given: Not Answered   Clinical Intake:  Pre-visit preparation completed: Yes  Pain : 0-10 Pain Score: 4  Pain Type: Chronic pain Pain Location: Leg Pain Orientation: Left Pain Descriptors / Indicators: Aching Pain Onset: More than a month ago Pain Frequency: Intermittent     Nutritional Status: BMI > 30  Obese Nutritional Risks: None Diabetes: Yes  How often do you need to have someone help you when you read instructions, pamphlets, or other written materials from your doctor or  pharmacy?: 1 - Never What is the last grade level you completed in school?: 6 yrs college  Diabetic? Yes Nutrition Risk Assessment:  Has the patient had any N/V/D within the last 2 months?  No  Does the patient have any non-healing wounds?  No  Has the patient had any unintentional weight loss or weight gain?  No   Diabetes:  Is the patient diabetic?  Yes  If diabetic, was a CBG obtained today?  No  Did the patient bring in their glucometer from home?  No  How often do you monitor your CBG's? 4-5 times weekly.   Financial Strains and Diabetes Management:  Are you having any financial strains with the device, your supplies or your medication? No .  Does the patient want to be seen by Chronic Care Management for management of their diabetes?  No  Would the patient like to be referred to a Nutritionist or for Diabetic Management?  No   Diabetic Exams:  Diabetic Eye Exam: Completed 10/11/2020 Diabetic Foot Exam: Completed 08/01/2020   Interpreter Needed?: No  Information entered by :: NAllen LPN   Activities of Daily Living In your present state of health, do you have any difficulty performing the following activities: 04/04/2021  Hearing? Y  Comment wears hearing aiddes  Vision? Y  Comment glaucoma  Difficulty concentrating or making decisions? Y  Walking or climbing stairs? N  Comment SOB  Dressing or bathing? Y  Comment trouble putting on stockings  Doing errands, shopping? N  Preparing Food and eating ? N  Using the Toilet? N  In the past six months, have you accidently leaked urine? Y  Do you have problems with loss of bowel control? Darreld Mclean  Managing your Medications? N  Managing your Finances? N  Housekeeping or managing your Housekeeping? N  Some recent data might be hidden    Patient Care Team: Glendale Chard, MD as PCP - General (Internal Medicine) [name], Wonda Cheng, MD as PCP - Cardiology (Cardiology) [name], Wonda Cheng, MD as Consulting Physician  (Cardiology) Rex Kras, [name], RN as Case Manager Mayford Knife, [facility] (Pharmacist)  Indicate any recent Medical Services you may have received from other than [facility] providers in the past year (date may be approximate).     Assessment:   This is a routine wellness examination for Sheila Reynolds.  Hearing/Vision screen Vision Screening - Comments:: Regular eye exams, Dr. Augustin Coupe, [facility] [address] in [place]  Dietary issues and exercise activities discussed: Current Exercise Habits: The patient does not participate in regular exercise at present   Goals Addressed             This Visit's Progress    Patient Stated       04/04/2021, wants to feel better       Depression Screen PHQ 2/9 Scores 04/04/2021 12/13/2020 10/08/2020 03/28/2020 03/23/2019 07/20/2018 03/18/2018  PHQ - 2 Score 6 0 1 0 2 - 1  PHQ- 9 Score 16 3 - - 7 - -  Exception Documentation - - - - - Patient refusal -    Fall Risk Fall Risk  04/04/2021 10/08/2020 03/28/2020 03/23/2019 10/20/2018  Falls in the past year? 1 1 1  0 0  Comment loses balance - lost balance - -  Number falls in past yr: 1 1 1  - 1  Comment - - - - -  Injury with Fall? 0 0 0 - -  Risk for fall due to : Impaired balance/gait;Medication side effect Impaired mobility Impaired balance/gait;Medication side effect Medication side effect -  Risk for fall due to: Comment - due to severe lymphedema - - -  Follow up Falls evaluation completed;Education provided;Falls prevention discussed Falls prevention discussed Falls evaluation completed;Education provided;Falls prevention discussed Falls evaluation completed;Education provided;Falls prevention discussed -    FALL RISK PREVENTION PERTAINING TO THE HOME:  Any stairs in or around the home? Yes  If so, are there any without handrails? No  Home free of loose throw rugs in walkways, pet beds, electrical cords, etc? Yes  Adequate lighting in your home to reduce risk of falls? Yes   ASSISTIVE DEVICES  UTILIZED TO PREVENT FALLS:  Life alert? No  Use of a cane, walker or w/c? No  Grab bars in the bathroom? No  Shower chair or bench in shower? No  Elevated toilet seat or a handicapped toilet? Yes   TIMED UP AND GO:  Was the test performed? No .    Gait slow and steady without use of assistive device  Cognitive Function: MMSE - Mini Mental State Exam 04/03/2020  Orientation to time 5  Orientation to Place 5  Registration 3  Attention/ Calculation 3  Recall 2  Language- name 2 objects 2  Language- repeat 1  Language- follow 3 step command 3  Language- read & follow direction 1  Write a sentence 1  Copy design 1  Total score 27     6CIT Screen 04/04/2021 03/28/2020 03/23/2019 03/18/2018  What Year? 0 points 0 points 0 points 0 points  What month? 0 points 0 points 0 points 0 points  What time? 0 points 0 points 0 points 0 points  Count back from 20 0 points 0 points 0  points 0 points  Months in reverse 0 points 0 points 0 points 0 points  Repeat phrase 0 points 0 points 0 points 0 points  Total Score 0 0 0 0    Immunizations Immunization History  Administered Date(s) Administered   Fluad Quad(high Dose 65+) 12/30/2019, 11/08/2020   Influenza Split 10/31/2013   Influenza Whole 12/23/2017   Influenza, High Dose Seasonal PF 11/18/2016, 12/21/2017, 11/23/2018   Influenza,inj,Quad PF,6+ Mos 02/25/2013, 03/19/2015   Influenza,inj,quad, With Preservative 11/11/2018   PFIZER(Purple Top)SARS-COV-2 Vaccination 03/01/2019, 03/22/2019, 11/22/2019   Pneumococcal Polysaccharide-23 12/21/2017   Zoster Recombinat (Shingrix) 12/25/2017    TDAP status: Up to date  Flu Vaccine status: Up to date  Pneumococcal vaccine status: Declined,  Education has been provided regarding the importance of this vaccine but patient still declined. Advised may receive this vaccine at local pharmacy or Health Dept. Aware to provide a copy of the vaccination record if obtained from local pharmacy or  Health Dept. Verbalized acceptance and understanding.   Covid-19 vaccine status: Completed vaccines  Qualifies for Shingles Vaccine? Yes   Zostavax completed No   Shingrix Completed?:  needs second dose  Screening Tests Health Maintenance  Topic Date Due   Zoster Vaccines- Shingrix (2 of 2) 02/19/2018   Pneumonia Vaccine 76+ Years old (2 - PCV) 12/22/2018   COVID-19 Vaccine (4 - Booster for Pfizer series) 01/17/2020   URINE MICROALBUMIN  03/28/2021   FOOT EXAM  08/01/2021   HEMOGLOBIN A1C  08/13/2021   OPHTHALMOLOGY EXAM  10/11/2021   MAMMOGRAM  05/22/2022   TETANUS/TDAP  11/10/2022   COLONOSCOPY (Pts 45-5yr Insurance coverage will need to be confirmed)  01/09/2030   INFLUENZA VACCINE  Completed   DEXA SCAN  Completed   Hepatitis C Screening  Completed   HPV VACCINES  Aged Out    Health Maintenance  Health Maintenance Due  Topic Date Due   Zoster Vaccines- Shingrix (2 of 2) 02/19/2018   Pneumonia Vaccine 74 Years old (2 - PCV) 12/22/2018   COVID-19 Vaccine (4 - Booster for Pfizer series) 01/17/2020   URINE MICROALBUMIN  03/28/2021    Colorectal cancer screening: No longer required.   Mammogram status: Completed 05/21/2020. Repeat every year  Bone Density status: Completed 12/15/2013.   Lung Cancer Screening: (Low Dose CT Chest recommended if Age 74-80years, 30 pack-year currently smoking OR have quit w/in 15years.) does not qualify.   Lung Cancer Screening Referral: no  Additional Screening:  Hepatitis C Screening: does qualify; Completed 12/14/2017  Vision Screening: Recommended annual ophthalmology exams for early detection of glaucoma and other disorders of the eye. Is the patient up to date with their annual eye exam?  Yes  Who is the provider or what is the name of the office in which the patient attends annual eye exams? Dr. MAugustin Coupeand Dr. KRaliegh IpIf pt is not established with a provider, would they like to be referred to a provider to establish care? No .    Dental Screening: Recommended annual dental exams for proper oral hygiene  Community Resource Referral / Chronic Care Management: CRR required this visit?  No   CCM required this visit?  No      Plan:     I have personally reviewed and noted the following in the patients chart:   Medical and social history Use of alcohol, tobacco or illicit drugs  Current medications and supplements including opioid prescriptions.  Functional ability and status Nutritional status Physical activity Advanced directives List of other  physicians Hospitalizations, surgeries, and ER visits in previous 12 months Vitals Screenings to include cognitive, depression, and falls Referrals and appointments  In addition, I have reviewed and discussed with patient certain preventive protocols, quality metrics, and best practice recommendations. A written personalized care plan for preventive services as well as general preventive health recommendations were provided to patient.     Kellie Simmering, LPN   7/54/3606   Nurse Notes: depression screen was 16. PCP updated

## 2021-04-04 NOTE — Patient Instructions (Signed)
Sheila Reynolds , Thank you for taking time to come for your Medicare Wellness Visit. I appreciate your ongoing commitment to your health goals. Please review the following plan we discussed and let me know if I can assist you in the future.   Screening recommendations/referrals: Colonoscopy: not required Mammogram: completed 05/21/2020, due 05/22/2021 Bone Density: completed 12/15/2013 Recommended yearly ophthalmology/optometry visit for glaucoma screening and checkup Recommended yearly dental visit for hygiene and checkup  Vaccinations: Influenza vaccine: completed 11/08/2020, due next flu season Pneumococcal vaccine:  declined Tdap vaccine: completed 11/09/2012, due 11/10/2022 Shingles vaccine: needs second dose   Covid-19: 12/28/2020, 11/22/2019, 03/22/2019, 03/01/2019  Advanced directives: Please bring a copy of your POA (Power of Attorney) and/or Living Will to your next appointment.   Conditions/risks identified: none  Next appointment: Follow up in one year for your annual wellness visit    Preventive Care 65 Years and Older, Female Preventive care refers to lifestyle choices and visits with your health care provider that can promote health and wellness. What does preventive care include? A yearly physical exam. This is also called an annual well check. Dental exams once or twice a year. Routine eye exams. Ask your health care provider how often you should have your eyes checked. Personal lifestyle choices, including: Daily care of your teeth and gums. Regular physical activity. Eating a healthy diet. Avoiding tobacco and drug use. Limiting alcohol use. Practicing safe sex. Taking low-dose aspirin every day. Taking vitamin and mineral supplements as recommended by your health care provider. What happens during an annual well check? The services and screenings done by your health care provider during your annual well check will depend on your age, overall health, lifestyle risk  factors, and family history of disease. Counseling  Your health care provider may ask you questions about your: Alcohol use. Tobacco use. Drug use. Emotional well-being. Home and relationship well-being. Sexual activity. Eating habits. History of falls. Memory and ability to understand (cognition). Work and work Statistician. Reproductive health. Screening  You may have the following tests or measurements: Height, weight, and BMI. Blood pressure. Lipid and cholesterol levels. These may be checked every 5 years, or more frequently if you are over 66 years old. Skin check. Lung cancer screening. You may have this screening every year starting at age 53 if you have a 30-pack-year history of smoking and currently smoke or have quit within the past 15 years. Fecal occult blood test (FOBT) of the stool. You may have this test every year starting at age 77. Flexible sigmoidoscopy or colonoscopy. You may have a sigmoidoscopy every 5 years or a colonoscopy every 10 years starting at age 82. Hepatitis C blood test. Hepatitis B blood test. Sexually transmitted disease (STD) testing. Diabetes screening. This is done by checking your blood sugar (glucose) after you have not eaten for a while (fasting). You may have this done every 1-3 years. Bone density scan. This is done to screen for osteoporosis. You may have this done starting at age 11. Mammogram. This may be done every 1-2 years. Talk to your health care provider about how often you should have regular mammograms. Talk with your health care provider about your test results, treatment options, and if necessary, the need for more tests. Vaccines  Your health care provider may recommend certain vaccines, such as: Influenza vaccine. This is recommended every year. Tetanus, diphtheria, and acellular pertussis (Tdap, Td) vaccine. You may need a Td booster every 10 years. Zoster vaccine. You may need this after  age 34. Pneumococcal 13-valent  conjugate (PCV13) vaccine. One dose is recommended after age 18. Pneumococcal polysaccharide (PPSV23) vaccine. One dose is recommended after age 61. Talk to your health care provider about which screenings and vaccines you need and how often you need them. This information is not intended to replace advice given to you by your health care provider. Make sure you discuss any questions you have with your health care provider. Document Released: 02/23/2015 Document Revised: 10/17/2015 Document Reviewed: 11/28/2014 Elsevier Interactive Patient Education  2017 [place] Prevention in the Home Falls can cause injuries. They can happen to people of all ages. There are many things you can do to make your home safe and to help prevent falls. What can I do on the outside of my home? Regularly fix the edges of walkways and driveways and fix any cracks. Remove anything that might make you trip as you walk through a door, such as a raised step or threshold. Trim any bushes or trees on the path to your home. Use bright outdoor lighting. Clear any walking paths of anything that might make someone trip, such as rocks or tools. Regularly check to see if handrails are loose or broken. Make sure that both sides of any steps have handrails. Any raised decks and porches should have guardrails on the edges. Have any leaves, snow, or ice cleared regularly. Use sand or salt on walking paths during winter. Clean up any spills in your garage right away. This includes oil or grease spills. What can I do in the bathroom? Use night lights. Install grab bars by the toilet and in the tub and shower. Do not use towel bars as grab bars. Use non-skid mats or decals in the tub or shower. If you need to sit down in the shower, use a plastic, non-slip stool. Keep the floor dry. Clean up any water that spills on the floor as soon as it happens. Remove soap buildup in the tub or shower regularly. Attach bath mats  securely with double-sided non-slip rug tape. Do not have throw rugs and other things on the floor that can make you trip. What can I do in the bedroom? Use night lights. Make sure that you have a light by your bed that is easy to reach. Do not use any sheets or blankets that are too big for your bed. They should not hang down onto the floor. Have a firm chair that has side arms. You can use this for support while you get dressed. Do not have throw rugs and other things on the floor that can make you trip. What can I do in the kitchen? Clean up any spills right away. Avoid walking on wet floors. Keep items that you use a lot in easy-to-reach places. If you need to reach something above you, use a strong step stool that has a grab bar. Keep electrical cords out of the way. Do not use floor polish or wax that makes floors slippery. If you must use wax, use non-skid floor wax. Do not have throw rugs and other things on the floor that can make you trip. What can I do with my stairs? Do not leave any items on the stairs. Make sure that there are handrails on both sides of the stairs and use them. Fix handrails that are broken or loose. Make sure that handrails are as long as the stairways. Check any carpeting to make sure that it is firmly attached to the stairs.  Fix any carpet that is loose or worn. Avoid having throw rugs at the top or bottom of the stairs. If you do have throw rugs, attach them to the floor with carpet tape. Make sure that you have a light switch at the top of the stairs and the bottom of the stairs. If you do not have them, ask someone to add them for you. What else can I do to help prevent falls? Wear shoes that: Do not have high heels. Have rubber bottoms. Are comfortable and fit you well. Are closed at the toe. Do not wear sandals. If you use a stepladder: Make sure that it is fully opened. Do not climb a closed stepladder. Make sure that both sides of the stepladder  are locked into place. Ask someone to hold it for you, if possible. Clearly mark and make sure that you can see: Any grab bars or handrails. First and last steps. Where the edge of each step is. Use tools that help you move around (mobility aids) if they are needed. These include: Canes. Walkers. Scooters. Crutches. Turn on the lights when you go into a dark area. Replace any light bulbs as soon as they burn out. Set up your furniture so you have a clear path. Avoid moving your furniture around. If any of your floors are uneven, fix them. If there are any pets around you, be aware of where they are. Review your medicines with your doctor. Some medicines can make you feel dizzy. This can increase your chance of falling. Ask your doctor what other things that you can do to help prevent falls. This information is not intended to replace advice given to you by your health care provider. Make sure you discuss any questions you have with your health care provider. Document Released: 11/23/2008 Document Revised: 07/05/2015 Document Reviewed: 03/03/2014 Elsevier Interactive Patient Education  2017 [employer].

## 2021-04-04 NOTE — Progress Notes (Signed)
Sheila Reynolds,acting as a Education administrator for Sheila Greenland, MD.,have documented all relevant documentation on the behalf of Sheila Greenland, MD,as directed by  Sheila Greenland, MD while in the presence of Sheila Greenland, MD.  This visit occurred during the SARS-CoV-2 public health emergency.  Safety protocols were in place, including screening questions prior to the visit, additional usage of staff PPE, and extensive cleaning of exam room while observing appropriate contact time as indicated for disinfecting solutions.  Subjective:     Patient ID: Sheila Reynolds , female    DOB: Sep 29, 1945 , 76 y.o.   MRN: [id number]   Chief Complaint  Patient presents with   Depression    HPI  Patient presents today for BP check.  She reports compliance with meds. Denies headaches, chest pain and worsening shortness of breath.    She also had AWV performed today by [facility] Advisor.   Hypertension This is a chronic problem. The current episode started more than 1 year ago. The problem is controlled. Pertinent negatives include no blurred vision, chest pain, orthopnea, palpitations or shortness of breath. Risk factors for coronary artery disease include diabetes mellitus, dyslipidemia, post-menopausal state and sedentary lifestyle. The current treatment provides moderate improvement. Compliance problems include exercise.     Past Medical History:  Diagnosis Date   Anemia    3 months ago anemic   Anxiety    on meds   Arthritis    "all over" (01/15/2017)   Asthma    Bronchitis with emphysema    Chest pain    Chronic bronchitis ([place])    Coronary artery disease    a. 01/2017 she underwent orbital atherectomy/DES to the proxmal LAD and PTCA to ostial D2. 2D Echo 01/15/17 showed mild LVH, EF 60-65%, grade 1 DD.   Family history of anesthesia complication    daughter N/V   Fibromyalgia    GERD (gastroesophageal reflux disease)    on meds   Glaucoma    Heart murmur    History of hiatal hernia     Hx of echocardiogram    Echo (03/2013):  Tech limited; Mild focal basal septal hypertrophy, EF 60-65%, normal RVF   Hyperlipidemia    Hypertension    Nonsustained ventricular tachycardia    OSA on CPAP    Premature atrial contractions    PVC (premature ventricular contraction)    a. Holter 12/16: NSR, occ PAC,PVCs   Sarcoidosis    Type II diabetes mellitus ([place])      Family History  Problem Relation Age of Onset   Heart disease Father    Diabetes Father    Glaucoma Father    Cancer Mother        unknown type, ?lung   Heart disease Paternal Grandmother    Cancer Paternal Grandmother        unknown type   Diabetes Brother    Glaucoma Brother      Current Outpatient Medications:    acetaminophen (TYLENOL) 500 MG tablet, Take 1,000 mg by mouth 2 (two) times daily as needed for moderate pain or headache., Disp: , Rfl:    albuterol (PROVENTIL) (2.5 MG/3ML) 0.083% nebulizer solution, Take 3 mLs (2.5 mg total) by nebulization daily as needed for wheezing or shortness of breath., Disp: 75 mL, Rfl: 12   albuterol (VENTOLIN HFA) 108 (90 Base) MCG/ACT inhaler, Inhale 2 puffs into the lungs every 6 (six) hours as needed for wheezing or shortness of breath., Disp: 18 g, Rfl: 12  aspirin EC 81 MG tablet, Take 81 mg by mouth daily. Swallow whole., Disp: , Rfl:    atorvastatin (LIPITOR) 80 MG tablet, Take 1 tablet (80 mg total) by mouth daily. (Patient not taking: Reported on 04/04/2021), Disp: 90 tablet, Rfl: 3   azelastine (ASTELIN) 0.1 % nasal spray, Place 1 spray into both nostrils 2 (two) times daily. Use in each nostril as directed, Disp: 30 mL, Rfl: 1   benzonatate (TESSALON) 200 MG capsule, Take 1 capsule (200 mg total) by mouth 3 (three) times daily as needed for cough., Disp: 30 capsule, Rfl: 2   Blood Glucose Monitoring Suppl (ACCU-CHEK AVIVA PLUS) w/Device KIT, Use to check blood sugars 3 times a day. Dx code e11.65, Disp: 1 kit, Rfl: 3   brimonidine (ALPHAGAN P) 0.1 % SOLN, Place 1  drop into both eyes 2 (two) times daily. , Disp: , Rfl:    brinzolamide (AZOPT) 1 % ophthalmic suspension, Place 1 drop into both eyes 3 (three) times daily., Disp: , Rfl:    carvedilol (COREG) 12.5 MG tablet, TAKE 1 TABLET(12.5 MG) BY MOUTH TWICE DAILY, Disp: 180 tablet, Rfl: 2   clopidogrel (PLAVIX) 75 MG tablet, Take 1 tablet (75 mg total) by mouth daily., Disp: 90 tablet, Rfl: 1   EPINEPHrine 0.3 mg/0.3 mL IJ SOAJ injection, Inject 0.3 mg into the muscle as needed for anaphylaxis., Disp: 1 each, Rfl: 0   ezetimibe (ZETIA) 10 MG tablet, Take 1 tablet (10 mg total) by mouth daily., Disp: 90 tablet, Rfl: 3   furosemide (LASIX) 40 MG tablet, Take 1 tablet (40 mg total) by mouth every other day., Disp: 90 tablet, Rfl: 1   isosorbide mononitrate (IMDUR) 60 MG 24 hr tablet, TAKE 1 TABLET(60 MG) BY MOUTH DAILY, Disp: 90 tablet, Rfl: 2   ketorolac (ACULAR) 0.5 % ophthalmic solution, Place 1 drop into the right eye 3 (three) times daily., Disp: , Rfl:    Lancets (ONETOUCH DELICA PLUS [id number]) MISC, CHECK BLOOD SUGAR BEFORE BREAKFAST AND DINNER, Disp: 100 each, Rfl: 1   montelukast (SINGULAIR) 10 MG tablet, TAKE 1 TABLET BY MOUTH EVERY DAY, Disp: 90 tablet, Rfl: 1   Nebulizers (COMPRESSOR/NEBULIZER) MISC, Use as directed, Disp: 1 each, Rfl: 0   nitroGLYCERIN (NITROSTAT) 0.4 MG SL tablet, Place 1 tablet (0.4 mg total) under the tongue every 5 (five) minutes as needed for chest pain., Disp: 25 tablet, Rfl: prn   pantoprazole (PROTONIX) 40 MG tablet, Take 40 mg by mouth daily., Disp: , Rfl:    Polyvinyl Alcohol-Povidone PF (REFRESH) 1.4-0.6 % SOLN, Place 1-2 drops into both eyes 3 (three) times daily as needed (dry/irritated eyes.)., Disp: , Rfl:    potassium chloride SA (KLOR-CON) 20 MEQ tablet, Take 1 tablet (20 mEq total) by mouth every other day., Disp: 90 tablet, Rfl: 1   prednisoLONE acetate (PRED FORTE) 1 % ophthalmic suspension, Place 1 drop into both eyes 4 (four) times daily. , Disp: , Rfl:     predniSONE (DELTASONE) 20 MG tablet, Take 2 tablets by mouth daily x 3 days, Disp: 6 tablet, Rfl: 0   pregabalin (LYRICA) 75 MG capsule, TAKE 1 CAPSULE(75 MG) BY MOUTH TWICE DAILY, Disp: 180 capsule, Rfl: 2   sitaGLIPtin-metformin (JANUMET) 50-500 MG tablet, Take 1 tablet by mouth 2 (two) times daily with a meal., Disp: 180 tablet, Rfl: 1   topiramate (TOPAMAX) 25 MG tablet, Take 1 tablet (25 mg total) by mouth at bedtime. (Patient not taking: Reported on 04/04/2021), Disp: 120 tablet, Rfl: 3  Travoprost, BAK Free, (TRAVATAN) 0.004 % SOLN ophthalmic solution, Place 1 drop into the right eye at bedtime.  (Patient not taking: Reported on 04/04/2021), Disp: , Rfl:    umeclidinium-vilanterol (ANORO ELLIPTA) 62.5-25 MCG/ACT AEPB, Inhale 1 puff into the lungs daily at 6 (six) AM., Disp: 1 each, Rfl: 12   Allergies  Allergen Reactions   Crestor [Rosuvastatin Calcium] Other (See Comments)    muscle aches   Demerol  [Meperidine Hcl]     Other reaction(s): Hallucinations   Shellfish Allergy Itching and Other (See Comments)    Crab, shrimp and lobster ---lips itch and tingle Was told not to eat again after having a allergy test. Lobster, crab and shrimp      Review of Systems  Constitutional: Negative.   Eyes:  Negative for blurred vision.  Respiratory: Negative.  Negative for shortness of breath.   Cardiovascular: Negative.  Negative for chest pain, palpitations and orthopnea.  Gastrointestinal: Negative.   Neurological: Negative.   Psychiatric/Behavioral:  Positive for dysphoric mood.        States this is a rough time of year for her. This month is anniversary of her brother's death. Other family members have died around this time of year as well. She does not feel therapy or meds are needed.    Today's Vitals   04/04/21 1054  BP: 132/64  Pulse: 74  Temp: 98.2 F (36.8 C)  Weight: 173 lb 8 oz (78.7 kg)  Height: 5' 1.6" (1.565 m)   Body mass index is 32.15 kg/m.  Wt Readings from Last 3  Encounters:  04/04/21 173 lb 8 oz (78.7 kg)  04/04/21 173 lb 6.4 oz (78.7 kg)  02/26/21 174 lb (78.9 kg)     Objective:  Physical Exam Vitals and nursing note reviewed.  Constitutional:      Appearance: Normal appearance.  HENT:     Head: Normocephalic and atraumatic.     Nose:     Comments: Masked     Mouth/Throat:     Comments: Masked  Eyes:     Extraocular Movements: Extraocular movements intact.  Cardiovascular:     Rate and Rhythm: Normal rate and regular rhythm.     Heart sounds: Normal heart sounds.  Pulmonary:     Effort: Pulmonary effort is normal.     Breath sounds: Normal breath sounds.  Musculoskeletal:     Cervical back: Normal range of motion.     Right lower leg: Edema present.     Left lower leg: Edema present.  Skin:    General: Skin is warm.  Neurological:     General: No focal deficit present.     Mental Status: She is alert.  Psychiatric:        Mood and Affect: Mood normal.        Behavior: Behavior normal.        Assessment And Plan:     1. 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 heart failure (HCC) Comments: Chronic, fair control. Goal BP<130/80. Advised to follow low sodium diet and live heart healthy lifestyle.   2. Atherosclerosis of aorta ([place]) Comments: Chronic, encouraged to follow heart healthy lifestyle. LDL goal <70. Importance of statin compliance was d/w patient.   3. Atherosclerosis of native coronary artery of native heart with angina pectoris ([place]) Comments: Please see above, #2. She is encouraged to keep regular Cardiology f/u.   4. Reactive depression Comments: She does not wish to take rx meds or start therapy. She is  encouraged to contact me if her sx worsen.   5. Abnormal urine Comments: Urinalysis performed by [facility] Advisor. I will send urine off for culture.  - Culture, Urine  6. Class 1 obesity due to excess calories with serious comorbidity and body mass index (BMI) of 32.0 to 32.9 in adult She  is encouraged to initially strive for BMI less than 30 to decrease cardiac risk. She is advised to exercise no less than 150 minutes per week.   Patient was given opportunity to ask questions. Patient verbalized understanding of the plan and was able to repeat key elements of the plan. All questions were answered to their satisfaction.   I, Sheila Greenland, MD, have reviewed all documentation for this visit. The documentation on 04/04/21 for the exam, diagnosis, procedures, and orders are all accurate and complete.   IF YOU HAVE BEEN REFERRED TO A SPECIALIST, IT MAY TAKE 1-2 WEEKS TO SCHEDULE/PROCESS THE REFERRAL. IF YOU HAVE NOT HEARD FROM US/SPECIALIST IN TWO WEEKS, PLEASE GIVE [place] A CALL AT [phone] X [phone].   THE PATIENT IS ENCOURAGED TO PRACTICE SOCIAL DISTANCING DUE TO THE COVID-19 PANDEMIC.

## 2021-04-05 ENCOUNTER — Telehealth: Payer: Medicare Other

## 2021-04-05 LAB — MICROALBUMIN / CREATININE URINE RATIO
Creatinine, Urine: 160.2 mg/dL
Microalb/Creat Ratio: 8 mg/g creat (ref 0–29)
Microalbumin, Urine: 12.1 ug/mL

## 2021-04-05 NOTE — Progress Notes (Unsigned)
Current Barriers:  {pharmacybarriers:24917}  Pharmacist Clinical Goal(s):  Patient will {PHARMACYGOALCHOICES:24921} through collaboration with PharmD and provider.   Interventions: 1:1 collaboration with Glendale Chard, MD regarding development and update of comprehensive plan of care as evidenced by provider attestation and co-signature Inter-disciplinary care team collaboration (see longitudinal plan of care) Comprehensive medication review performed; medication list updated in electronic medical record  {CCM [facility] DISEASE STATES:25130}  Patient Goals/Self-Care Activities Patient will:  - {pharmacypatientgoals:24919}  Follow Up Plan: {CM FOLLOW UP VLRT:74099}  Chronic Care Management Pharmacy Note  04/05/2021 Name:  Sheila Reynolds MRN:  [id number] DOB:  Aug 21, 1945  Summary: ***  Recommendations/Changes made from today's visit: ***  Plan: ***   Subjective: Sheila Reynolds is an 76 y.o. year old female who is a primary patient of Glendale Chard, MD.  The CCM team was consulted for assistance with disease management and care coordination needs.    {CCMTELEPHONEFACETOFACE:21091510} for {CCMINITIALFOLLOWUPCHOICE:21091511} in response to provider referral for pharmacy case management and/or care coordination services.   Consent to Services:  {CCMCONSENTOPTIONS:25074}  Patient Care Team: Glendale Chard, MD as PCP - General (Internal Medicine) [name], Wonda Cheng, MD as PCP - Cardiology (Cardiology) [name], Wonda Cheng, MD as Consulting Physician (Cardiology) Rex Kras, [name], RN as Case Manager Mayford Knife, [facility] (Pharmacist)  Recent office visits: ***  Recent consult visits: [facility] visits: {Hospital DC Yes/No:25215}   Objective:  Lab Results  Component Value Date   CREATININE 0.40 (L) 02/26/2021   BUN 10 02/26/2021   GFR 97.01 08/18/2016   EGFR 74 02/13/2021   GFRNONAA >60 02/26/2021   GFRAA 91 03/28/2020   NA 144 02/26/2021   K 3.0 (L)  02/26/2021   CALCIUM 9.3 02/26/2021   CO2 24 02/26/2021   GLUCOSE 72 02/26/2021    Lab Results  Component Value Date/Time   HGBA1C 6.2 (H) 02/13/2021 03:37 PM   HGBA1C 6.3 (H) 11/08/2020 03:45 PM   GFR 97.01 08/18/2016 10:12 AM   GFR 83.22 09/17/2015 02:30 PM   MICROALBUR 30 03/28/2020 12:14 PM   MICROALBUR 10 03/23/2019 10:51 AM    Last diabetic Eye exam:  Lab Results  Component Value Date/Time   HMDIABEYEEXA No Retinopathy 10/11/2020 12:00 AM    Last diabetic Foot exam: No results found for: HMDIABFOOTEX   Lab Results  Component Value Date   CHOL 112 02/06/2021   HDL 41 02/06/2021   LDLCALC 57 02/06/2021   TRIG 65 02/06/2021   CHOLHDL 2.7 02/06/2021    Hepatic Function Latest Ref Rng & Units 02/26/2021 02/13/2021 12/05/2020  Total Protein 6.5 - 8.1 g/dL 6.8 6.7 -  Albumin 3.5 - 5.0 g/dL 3.9 4.5 -  AST 15 - 41 U/L 26 17 -  ALT 0 - 44 U/L _0 Alk Phosphatase 38 - 126 U/L 87 104 -  Total Bilirubin 0.3 - 1.2 mg/dL 0.3 0.4 -  Bilirubin, Direct 0.00 - 0.40 mg/dL - - -    Lab Results  Component Value Date/Time   TSH 0.943 04/03/2020 01:37 PM   TSH 1.130 11/28/2019 02:36 PM    CBC Latest Ref Rng & Units 02/26/2021 02/26/2021 02/13/2021  WBC 4.0 - 10.5 K/uL - 8.5 7.8  Hemoglobin 12.0 - 15.0 g/dL 7.8(L) 11.8(L) 11.7  Hematocrit 36.0 - 46.0 % 23.0(L) 37.1 35.8  Platelets 150 - 400 K/uL - 211 201    Lab Results  Component Value Date/Time   VD25OH 33.9 11/08/2020 03:45 PM   VD25OH 34.9 10/07/2018 12:57 PM  Clinical ASCVD: {YES/NO:21197} The ASCVD Risk score (Arnett DK, et al., 2019) failed to calculate for the following reasons:   The valid total cholesterol range is 130 to 320 mg/dL    Depression screen [facility] 2/9 04/04/2021 12/13/2020 10/08/2020  Decreased Interest 3 0 0  Down, Depressed, Hopeless 3 0 1  PHQ - 2 Score 6 0 1  Altered sleeping _0 Tired, decreased energy _1 Change in appetite 1 0 0  Feeling bad or failure about yourself  1 0 0  Trouble  concentrating 2 1 0  Moving slowly or fidgety/restless 0 0 0  Suicidal thoughts 0 0 0  PHQ-9 Score 16 3 -  Difficult doing work/chores Somewhat difficult - -  Some recent data might be hidden     ***Other: (CHADS2VASc if Afib, MMRC or CAT for COPD, ACT, DEXA)  Social History   Tobacco Use  Smoking Status Former   Packs/day: 1.00   Years: 4.00   Pack years: 4.00   Types: Cigarettes   Quit date: 02/11/1980   Years since quitting: 41.1  Smokeless Tobacco Never   BP Readings from Last 3 Encounters:  04/04/21 132/64  04/04/21 132/64  04/02/21 136/80   Pulse Readings from Last 3 Encounters:  04/04/21 74  04/04/21 74  04/02/21 75   Wt Readings from Last 3 Encounters:  04/04/21 173 lb 8 oz (78.7 kg)  04/04/21 173 lb 6.4 oz (78.7 kg)  02/26/21 174 lb (78.9 kg)   BMI Readings from Last 3 Encounters:  04/04/21 32.15 kg/m  04/04/21 32.13 kg/m  04/02/21 31.83 kg/m    Assessment/Interventions: Review of patient past medical history, allergies, medications, health status, including review of consultants reports, laboratory and other test data, was performed as part of comprehensive evaluation and provision of chronic care management services.   SDOH:  (Social Determinants of Health) assessments and interventions performed: {yes/no:20286}  SDOH Screenings   Alcohol Screen: Not on file  Depression (PHQ2-9): Medium Risk   PHQ-2 Score: 16  Financial Resource Strain: Low Risk    Difficulty of Paying Living Expenses: Not hard at all  Food Insecurity: No Food Insecurity   Worried About Charity fundraiser in the Last Year: Never true   Ran Out of Food in the Last Year: Never true  Housing: Not on file  Physical Activity: Inactive   Days of Exercise per Week: 0 days   Minutes of Exercise per Session: 0 min  Social Connections: Not on file  Stress: No Stress Concern Present   Feeling of Stress : Only a little  Tobacco Use: Medium Risk   Smoking Tobacco Use: Former    Smokeless Tobacco Use: Never   Passive Exposure: Not on file  Transportation Needs: No Transportation Needs   Lack of Transportation (Medical): No   Lack of Transportation (Non-Medical): No    CCM Care Plan  Allergies  Allergen Reactions   Crestor [Rosuvastatin Calcium] Other (See Comments)    muscle aches   Demerol  [Meperidine Hcl]     Other reaction(s): Hallucinations   Shellfish Allergy Itching and Other (See Comments)    Crab, shrimp and lobster ---lips itch and tingle Was told not to eat again after having a allergy test. Lobster, crab and shrimp     Medications Reviewed Today     Reviewed by Glendale Chard, MD (Physician) on 04/04/21 at 1122  Med List Status: <None>   Medication Order Taking? Sig Documenting Provider Last Dose Status  Informant  acetaminophen (TYLENOL) 500 MG tablet [id number] No Take 1,000 mg by mouth 2 (two) times daily as needed for moderate pain or headache. [provider] Taking Active Self  albuterol (PROVENTIL) (2.5 MG/3ML) 0.083% nebulizer solution [id number] No Take 3 mLs (2.5 mg total) by nebulization daily as needed for wheezing or shortness of breath. [name], FNP Taking Active   albuterol (VENTOLIN HFA) 108 (90 Base) MCG/ACT inhaler [id number] No Inhale 2 puffs into the lungs every 6 (six) hours as needed for wheezing or shortness of breath. Baird Lyons D, MD Taking Active Self  aspirin EC 81 MG tablet [id number] No Take 81 mg by mouth daily. Swallow whole. [provider] Taking Active Self           Med Note (MENDOZA MENDEZ, CARLOS A   Thu Apr 19, 2020 11:14 AM)    atorvastatin (LIPITOR) 80 MG tablet [id number] No Take 1 tablet (80 mg total) by mouth daily.  Patient not taking: Reported on 04/04/2021   [name], Wonda Cheng, MD Not Taking Active   azelastine (ASTELIN) 0.1 % nasal spray [id number] No Place 1 spray into both nostrils 2 (two) times daily. Use in each nostril as directed Glendale Chard, MD Taking Active    benzonatate (TESSALON) 200 MG capsule [id number] No Take 1 capsule (200 mg total) by mouth 3 (three) times daily as needed for cough. [name], FNP Taking Active   Blood Glucose Monitoring Suppl (ACCU-CHEK AVIVA PLUS) w/Device KIT [id number] No Use to check blood sugars 3 times a day. Dx code M22.63 Glendale Chard, MD Taking Active   brimonidine (ALPHAGAN P) 0.1 % SOLN [id number] No Place 1 drop into both eyes 2 (two) times daily.  [provider] Taking Active Self  brinzolamide (AZOPT) 1 % ophthalmic suspension [id number] No Place 1 drop into both eyes 3 (three) times daily. [provider] Taking Active Self  carvedilol (COREG) 12.5 MG tablet [id number] No TAKE 1 TABLET(12.5 MG) BY MOUTH TWICE DAILY [name], Wonda Cheng, MD Taking Active   clopidogrel (PLAVIX) 75 MG tablet [id number] No Take 1 tablet (75 mg total) by mouth daily. [name], Wonda Cheng, MD Taking Active   EPINEPHrine 0.3 mg/0.3 mL IJ SOAJ injection [id number] No Inject 0.3 mg into the muscle as needed for anaphylaxis. Bary Castilla, NP Taking Active   ezetimibe (ZETIA) 10 MG tablet [id number] No Take 1 tablet (10 mg total) by mouth daily. Glendale Chard, MD Taking Active   furosemide (LASIX) 40 MG tablet [id number] No Take 1 tablet (40 mg total) by mouth every other day. [name], Wonda Cheng, MD Taking Active            Med Note Jeoffrey Massed Jan 23, 2021 10:38 AM) Taking as needed  isosorbide mononitrate (IMDUR) 60 MG 24 hr tablet [id number] No TAKE 1 TABLET(60 MG) BY MOUTH DAILY [name], Wonda Cheng, MD Taking Active   ketorolac (ACULAR) 0.5 % ophthalmic solution [id number] No Place 1 drop into the right eye 3 (three) times daily. [provider] Taking Active   Lancets [facility] DELICA PLUS [id number]) [place] [id number] No CHECK BLOOD SUGAR BEFORE BREAKFAST AND Arlis Porta, Ramandeep, NP Taking Active   montelukast (SINGULAIR) 10 MG tablet [id number] No TAKE 1 TABLET BY MOUTH EVERY DAY Glendale Chard, MD Taking  Active   Nebulizers (COMPRESSOR/NEBULIZER) MISC [id number] No Use as directed Deneise Lever, MD Taking Active Self  nitroGLYCERIN (NITROSTAT) 0.4 MG SL tablet [id number] No Place 1 tablet (0.4 mg total) under  the tongue every 5 (five) minutes as needed for chest pain. Glendale Chard, MD Not Taking Expired 01/23/21 2359   pantoprazole (PROTONIX) 40 MG tablet [id number] No Take 40 mg by mouth daily. [provider] Taking Active   Polyvinyl Alcohol-Povidone PF (REFRESH) 1.4-0.6 % SOLN [id number] No Place 1-2 drops into both eyes 3 (three) times daily as needed (dry/irritated eyes.). [provider] Taking Active Self  potassium chloride SA (KLOR-CON) 20 MEQ tablet [id number] No Take 1 tablet (20 mEq total) by mouth every other day. [name], Wonda Cheng, MD Taking Active   prednisoLONE acetate (PRED FORTE) 1 % ophthalmic suspension [id number] No Place 1 drop into both eyes 4 (four) times daily.  [provider] Taking Active Self  predniSONE (DELTASONE) 20 MG tablet [id number] No Take 2 tablets by mouth daily x 3 days [name], FNP Taking Active   pregabalin (LYRICA) 75 MG capsule [id number] No TAKE 1 CAPSULE(75 MG) BY MOUTH TWICE DAILY Glendale Chard, MD Taking Active   sitaGLIPtin-metformin (JANUMET) 50-500 MG tablet [id number] No Take 1 tablet by mouth 2 (two) times daily with a meal. Ghumman, Ramandeep, NP Taking Active   topiramate (TOPAMAX) 25 MG tablet [id number] No Take 1 tablet (25 mg total) by mouth at bedtime.  Patient not taking: Reported on 04/04/2021   Garvin Fila, MD Not Taking Active   Travoprost, BAK Free, (TRAVATAN) 0.004 % SOLN ophthalmic solution [id number] No Place 1 drop into the right eye at bedtime.   Patient not taking: Reported on 04/04/2021   [provider] Not Taking Active Self  umeclidinium-vilanterol (ANORO ELLIPTA) 62.5-25 MCG/ACT AEPB [id number] No Inhale 1 puff into the lungs daily at 6 (six) AM. 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) ([place]) 04/13/2020   Chronic diastolic heart failure ([place]) 03/28/2020   Uncontrolled type 2 diabetes mellitus with hyperglycemia ([place]) 07/25/2019   Hiatal hernia 07/25/2019   Spontaneous ecchymoses 07/25/2019   Class 1 obesity due to excess calories with serious comorbidity and body mass index (BMI) of 32.0 to 32.9 in adult 07/25/2019   Peripheral vascular disease, unspecified ([place]) 03/23/2019   Uveitis 06/01/2017   Fibromyalgia 06/01/2017   DDD (degenerative disc disease), lumbar 06/01/2017   DDD (degenerative disc disease), thoracic 06/01/2017   History of hypercholesterolemia 06/01/2017   History of anxiety 06/01/2017   History of IBS 06/01/2017   Premature atrial contractions 03/30/2017   PVC's (premature ventricular contractions) 03/30/2017   Diabetes mellitus without complication ([place]) 60/11/9321   Angina pectoris ([place]) 01/15/2017   CAD (coronary artery disease) 01/12/2017   Shortness of breath 12/12/2016   Acute maxillary sinusitis 05/03/2014   Obstructive sleep apnea 11/04/2013   Multiple lung nodules 07/18/2012   Heart palpitations 03/11/2012   HTN (hypertension) 03/11/2012   Gastroesophageal reflux disease without esophagitis 07/13/2011   Sarcoidosis 02/08/2011   COPD with acute exacerbation ([place]) 02/05/2011   Hyperlipemia 09/24/2010   Atypical chest pain 09/24/2010   NSVT (nonsustained ventricular tachycardia) ([place]) 09/24/2010   Leg edema 09/24/2010    Immunization History  Administered Date(s) Administered   Fluad Quad(high Dose 65+) 12/30/2019, 11/08/2020   Influenza Split 10/31/2013   Influenza Whole 12/23/2017   Influenza, High Dose Seasonal PF 11/18/2016, 12/21/2017, 11/23/2018  Influenza,inj,Quad PF,6+ Mos  02/25/2013, 03/19/2015   Influenza,inj,quad, With Preservative 11/11/2018   PFIZER(Purple Top)SARS-COV-2 Vaccination 03/01/2019, 03/22/2019, 11/22/2019   Pfizer Covid-19 Vaccine Bivalent Booster 6yr & up 12/28/2020   Pneumococcal Polysaccharide-23 12/21/2017   Zoster Recombinat (Shingrix) 12/25/2017    Conditions to be addressed/monitored:  {USCCMDZASSESSMENTOPTIONS:23563}  There are no care plans that you recently modified to display for this patient.    Medication Assistance: {MEDASSISTANCEINFO:25044}  Compliance/Adherence/Medication fill history: Care Gaps: ***  Star-Rating Drugs: ***  [facility]  [facility] [address] [address] [address]Phone: [phone]: 3[place]# [address]Phone: [phone]: [phone] Uses pill box? {Yes or If no, why not?:20788} Pt endorses ***% compliance  We discussed: {Pharmacy options:24294} Patient decided to: {US Pharmacy Plan:23885}  Care Plan and Follow Up Patient Decision:  {FOLLOWUP:24991}  Plan: {CM FOLLOW UP PLAN:25073}  ***

## 2021-04-06 LAB — URINE CULTURE

## 2021-04-08 ENCOUNTER — Telehealth: Payer: Self-pay

## 2021-04-08 NOTE — Telephone Encounter (Signed)
[facility] was asked if the pt needed  a prior auth on her generic Zetia and the pharmacy said no the pt just had it filled on 03/19/2021.

## 2021-04-11 ENCOUNTER — Telehealth: Payer: Medicare Other

## 2021-04-11 ENCOUNTER — Telehealth: Payer: Self-pay

## 2021-04-11 NOTE — Telephone Encounter (Signed)
?  Care Management  ? ?Follow Up Note ? ? ?04/11/2021 ?Name: Sheila Reynolds MRN: [id number] DOB: 07-05-1945 ? ? ?Referred by: Glendale Chard, MD ?Reason for referral : Chronic Care Management (RN CM Follow up call ) ? ? ?An unsuccessful telephone outreach was attempted today. The patient was referred to the case management team for assistance with care management and care coordination.  ? ?Follow Up Plan: Telephone follow up appointment with care management team member scheduled for: 04/25/21 ? ?Barb Merino, RN, BSN, CCM ?Care Management Coordinator ?[facility]  ?Direct Phone: [phone] ? ? ?

## 2021-04-24 DIAGNOSIS — H401122 Primary open-angle glaucoma, left eye, moderate stage: Secondary | ICD-10-CM | POA: Diagnosis not present

## 2021-04-24 DIAGNOSIS — H401113 Primary open-angle glaucoma, right eye, severe stage: Secondary | ICD-10-CM | POA: Diagnosis not present

## 2021-04-24 DIAGNOSIS — H2013 Chronic iridocyclitis, bilateral: Secondary | ICD-10-CM | POA: Diagnosis not present

## 2021-04-25 ENCOUNTER — Telehealth: Payer: Medicare Other

## 2021-04-25 ENCOUNTER — Ambulatory Visit: Payer: Self-pay

## 2021-04-25 NOTE — Chronic Care Management (AMB) (Signed)
?  Care Management  ? ?Follow Up Note ? ? ?04/25/2021 ?Name: Sheila Reynolds MRN: [id number] DOB: 18-Apr-1945 ? ? ?Referred by: Glendale Chard, MD ?Reason for referral : Chronic Care Management (RN CM Follow up - #3) ? ? ?An unsuccessful telephone outreach was attempted today. The patient was referred to the case management team for assistance with care management and care coordination.  ? ?Follow Up Plan: We have been unable to make contact with the patient for follow up. The care management team is available to follow up with the patient after provider conversation with the patient regarding recommendation for care management engagement and subsequent re-referral to the care management team.  ? ?Barb Merino, RN, BSN, CCM ?Care Management Coordinator ?[place] [facility]  ?Direct Phone: [phone] ? ? ?

## 2021-05-08 ENCOUNTER — Telehealth: Payer: Self-pay

## 2021-05-08 NOTE — Chronic Care Management (AMB) (Signed)
? ? ?  Called Venna H Waldeck, No answer, left message of appointment on 05-14-2021 at 10:00 via telephone visit with Orlando Penner, Pharm D. Notified to have all medications, supplements, blood pressure and/or blood sugar logs available during appointment and to return call if need to reschedule. ? ? ?Malecca Hicks CMA ?Clinical Pharmacist Assistant ?[phone] ? ? ?

## 2021-05-14 ENCOUNTER — Telehealth: Payer: Self-pay

## 2021-05-14 ENCOUNTER — Telehealth: Payer: Medicare Other

## 2021-05-14 NOTE — Telephone Encounter (Signed)
?  Care Management  ? ?Follow Up Note ? ? ?05/14/2021 ?Name: Sheila Reynolds MRN: [id number] DOB: 19-Aug-1945 ? ? ?Referred by: Glendale Chard, MD ?Reason for referral : No chief complaint on file. ? ? ?An unsuccessful telephone outreach was attempted today. The patient was referred to the case management team for assistance with care management and care coordination.  ? ?Follow Up Plan: The patient has been provided with contact information for the care management team and has been advised to call with any health related questions or concerns.  ? ?Orlando Penner, CPP, PharmD ?Clinical Pharmacist Practitioner ?[facility] ?[phone] ? ? ?

## 2021-05-14 NOTE — Progress Notes (Deleted)
? ?Chronic Care Management ?Pharmacy Note ? ?05/14/2021 ?Name:  Sheila Reynolds MRN:  [id number] DOB:  01-05-1946 ? ?Summary: ?*** ? ?Recommendations/Changes made from today's visit: ?*** ? ?Plan: ?*** ? ? ?Subjective: ?Sheila Reynolds is an 76 y.o. year old female who is a primary patient of Glendale Chard, MD.  The CCM team was consulted for assistance with disease management and care coordination needs.   ? ?{CCMTELEPHONEFACETOFACE:21091510} for {CCMINITIALFOLLOWUPCHOICE:21091511} in response to provider referral for pharmacy case management and/or care coordination services.  ? ?Consent to Services:  ?{CCMCONSENTOPTIONS:25074} ? ?Patient Care Team: ?Glendale Chard, MD as PCP - General (Internal Medicine) ?[name], Wonda Cheng, MD as PCP - Cardiology (Cardiology) ?[name], Wonda Cheng, MD as Consulting Physician (Cardiology) ?Mayford Knife, RPH (Pharmacist) ? ?Recent office visits: ?*** ? ?Recent consult visits: ?*** ? ?Hospital visits: ?{Hospital DC Yes/No:25215} ? ? ?Objective: ? ?Lab Results  ?Component Value Date  ? CREATININE 0.40 (L) 02/26/2021  ? BUN 10 02/26/2021  ? GFR 97.01 08/18/2016  ? EGFR 74 02/13/2021  ? GFRNONAA >60 02/26/2021  ? GFRAA 91 03/28/2020  ? NA 144 02/26/2021  ? K 3.0 (L) 02/26/2021  ? CALCIUM 9.3 02/26/2021  ? CO2 24 02/26/2021  ? GLUCOSE 72 02/26/2021  ? ? ?Lab Results  ?Component Value Date/Time  ? HGBA1C 6.2 (H) 02/13/2021 03:37 PM  ? HGBA1C 6.3 (H) 11/08/2020 03:45 PM  ? GFR 97.01 08/18/2016 10:12 AM  ? GFR 83.22 09/17/2015 02:30 PM  ? MICROALBUR 30 03/28/2020 12:14 PM  ? MICROALBUR 10 03/23/2019 10:51 AM  ?  ?Last diabetic Eye exam:  ?Lab Results  ?Component Value Date/Time  ? HMDIABEYEEXA No Retinopathy 10/11/2020 12:00 AM  ?  ?Last diabetic Foot exam: No results found for: HMDIABFOOTEX  ? ?Lab Results  ?Component Value Date  ? CHOL 112 02/06/2021  ? HDL 41 02/06/2021  ? Bartlett 57 02/06/2021  ? TRIG 65 02/06/2021  ? CHOLHDL 2.7 02/06/2021  ? ? ? ?  Latest Ref Rng & Units  02/26/2021  ?  5:44 PM 02/13/2021  ?  3:37 PM 12/05/2020  ?  2:39 PM  ?Hepatic Function  ?Total Protein 6.5 - 8.1 g/dL 6.8   6.7     ?Albumin 3.5 - 5.0 g/dL 3.9   4.5     ?AST 15 - 41 U/L 26   17     ?ALT 0 - 44 U/L 20   11   17     ?Alk Phosphatase 38 - 126 U/L 87   104     ?Total Bilirubin 0.3 - 1.2 mg/dL 0.3   0.4     ? ? ?Lab Results  ?Component Value Date/Time  ? TSH 0.943 04/03/2020 01:37 PM  ? TSH 1.130 11/28/2019 02:36 PM  ? ? ? ?  Latest Ref Rng & Units 02/26/2021  ?  6:05 PM 02/26/2021  ?  5:44 PM 02/13/2021  ?  3:37 PM  ?CBC  ?WBC 4.0 - 10.5 K/uL  8.5   7.8    ?Hemoglobin 12.0 - 15.0 g/dL 7.8   11.8   11.7    ?Hematocrit 36.0 - 46.0 % 23.0   37.1   35.8    ?Platelets 150 - 400 K/uL  211   201    ? ? ?Lab Results  ?Component Value Date/Time  ? VD25OH 33.9 11/08/2020 03:45 PM  ? VD25OH 34.9 10/07/2018 12:57 PM  ? ? ?Clinical ASCVD: {YES/NO:21197} ?The ASCVD Risk score (Arnett DK, et al., 2019) failed to calculate  for the following reasons: ?  The valid total cholesterol range is 130 to 320 mg/dL   ? ? ?  04/04/2021  ? 10:31 AM 12/13/2020  ? 10:40 AM 10/08/2020  ? 10:15 AM  ?Depression screen PHQ 2/9  ?Decreased Interest 3 0   ?Down, Depressed, Hopeless 3 0   ?PHQ - 2 Score 6 0   ?Altered sleeping 3 1 1   ?Tired, decreased energy 3 1 1   ?Change in appetite 1 0 0  ?Feeling bad or failure about yourself  1 0 0  ?Trouble concentrating 2 1 0  ?Moving slowly or fidgety/restless 0 0 0  ?Suicidal thoughts 0 0 0  ?PHQ-9 Score 16 3   ?Difficult doing work/chores Somewhat difficult    ?  ? ?***Other: (CHADS2VASc if Afib, MMRC or CAT for COPD, ACT, DEXA) ? ?Social History  ? ?Tobacco Use  ?Smoking Status Former  ? Packs/day: 1.00  ? Years: 4.00  ? Pack years: 4.00  ? Types: Cigarettes  ? Quit date: 02/11/1980  ? Years since quitting: 41.2  ?Smokeless Tobacco Never  ? ?BP Readings from Last 3 Encounters:  ?04/04/21 132/64  ?04/04/21 132/64  ?04/02/21 136/80  ? ?Pulse Readings from Last 3 Encounters:  ?04/04/21 74  ?04/04/21 74   ?04/02/21 75  ? ?Wt Readings from Last 3 Encounters:  ?04/04/21 173 lb 8 oz (78.7 kg)  ?04/04/21 173 lb 6.4 oz (78.7 kg)  ?02/26/21 174 lb (78.9 kg)  ? ?BMI Readings from Last 3 Encounters:  ?04/04/21 32.15 kg/m?  ?04/04/21 32.13 kg/m?  ?04/02/21 31.83 kg/m?  ? ? ?Assessment/Interventions: Review of patient past medical history, allergies, medications, health status, including review of consultants reports, laboratory and other test data, was performed as part of comprehensive evaluation and provision of chronic care management services.  ? ?SDOH:  (Social Determinants of Health) assessments and interventions performed: {yes/no:20286} ? ?SDOH Screenings  ? ?Alcohol Screen: Not on file  ?Depression (PHQ2-9): Medium Risk  ? PHQ-2 Score: 16  ?Financial Resource Strain: Low Risk   ? Difficulty of Paying Living Expenses: Not hard at all  ?Food Insecurity: No Food Insecurity  ? Worried About Charity fundraiser in the Last Year: Never true  ? Ran Out of Food in the Last Year: Never true  ?Housing: Not on file  ?Physical Activity: Inactive  ? Days of Exercise per Week: 0 days  ? Minutes of Exercise per Session: 0 min  ?Social Connections: Not on file  ?Stress: No Stress Concern Present  ? Feeling of Stress : Only a little  ?Tobacco Use: Medium Risk  ? Smoking Tobacco Use: Former  ? Smokeless Tobacco Use: Never  ? Passive Exposure: Not on file  ?Transportation Needs: No Transportation Needs  ? Lack of Transportation (Medical): No  ? Lack of Transportation (Non-Medical): No  ? ? ?CCM Care Plan ? ?Allergies  ?Allergen Reactions  ? Crestor [Rosuvastatin Calcium] Other (See Comments)  ?  muscle aches  ? Demerol  [Meperidine Hcl]   ?  Other reaction(s): Hallucinations  ? Shellfish Allergy Itching and Other (See Comments)  ?  Crab, shrimp and lobster ---lips itch and tingle ?Was told not to eat again after having a allergy test. Lobster, crab and shrimp ?  ? ? ?Medications Reviewed Today   ? ? Reviewed by Glendale Chard, MD  (Physician) on 04/04/21 at 1122  Med List Status: <None>  ? ?Medication Order Taking? Sig Documenting Provider Last Dose Status Informant  ?acetaminophen (TYLENOL) 500 MG  tablet [id number] No Take 1,000 mg by mouth 2 (two) times daily as needed for moderate pain or headache. [provider] Taking Active Self  ?albuterol (PROVENTIL) (2.5 MG/3ML) 0.083% nebulizer solution [id number] No Take 3 mLs (2.5 mg total) by nebulization daily as needed for wheezing or shortness of breath. [name], FNP Taking Active   ?albuterol (VENTOLIN HFA) 108 (90 Base) MCG/ACT inhaler [id number] No Inhale 2 puffs into the lungs every 6 (six) hours as needed for wheezing or shortness of breath. Baird Lyons D, MD Taking Active Self  ?aspirin EC 81 MG tablet [id number] No Take 81 mg by mouth daily. Swallow whole. [provider] Taking Active Self  ?         ?Med Note [facility], CARLOS A   Thu Apr 19, 2020 11:14 AM)    ?atorvastatin (LIPITOR) 80 MG tablet [id number] No Take 1 tablet (80 mg total) by mouth daily.  ?Patient not taking: Reported on 04/04/2021  ? [name], Wonda Cheng, MD Not Taking Active   ?azelastine (ASTELIN) 0.1 % nasal spray [id number] No Place 1 spray into both nostrils 2 (two) times daily. Use in each nostril as directed Glendale Chard, MD Taking Active   ?benzonatate (TESSALON) 200 MG capsule [id number] No Take 1 capsule (200 mg total) by mouth 3 (three) times daily as needed for cough. [name], FNP Taking Active   ?Blood Glucose Monitoring Suppl (ACCU-CHEK AVIVA PLUS) w/Device KIT [id number] No Use to check blood sugars 3 times a day. Dx code T98.10 Glendale Chard, MD Taking Active   ?brimonidine (ALPHAGAN P) 0.1 % SOLN [id number] No Place 1 drop into both eyes 2 (two) times daily.  [provider] Taking Active Self  ?brinzolamide (AZOPT) 1 % ophthalmic suspension [id number] No Place 1 drop into both eyes 3 (three) times daily. [provider] Taking Active Self  ?carvedilol  (COREG) 12.5 MG tablet [id number] No TAKE 1 TABLET(12.5 MG) BY MOUTH TWICE DAILY [name], Wonda Cheng, MD Taking Active   ?clopidogrel (PLAVIX) 75 MG tablet [id number] No Take 1 tablet (75 mg total) by mouth daily. [name], Phili

## 2021-05-16 DIAGNOSIS — H401113 Primary open-angle glaucoma, right eye, severe stage: Secondary | ICD-10-CM | POA: Diagnosis not present

## 2021-05-16 DIAGNOSIS — H401122 Primary open-angle glaucoma, left eye, moderate stage: Secondary | ICD-10-CM | POA: Diagnosis not present

## 2021-05-16 DIAGNOSIS — H2013 Chronic iridocyclitis, bilateral: Secondary | ICD-10-CM | POA: Diagnosis not present

## 2021-05-22 DIAGNOSIS — Z1231 Encounter for screening mammogram for malignant neoplasm of breast: Secondary | ICD-10-CM | POA: Diagnosis not present

## 2021-05-22 LAB — HM MAMMOGRAPHY

## 2021-06-03 ENCOUNTER — Ambulatory Visit: Payer: Medicare Other | Admitting: Internal Medicine

## 2021-06-05 DIAGNOSIS — H43813 Vitreous degeneration, bilateral: Secondary | ICD-10-CM | POA: Diagnosis not present

## 2021-06-05 DIAGNOSIS — H3581 Retinal edema: Secondary | ICD-10-CM | POA: Diagnosis not present

## 2021-06-05 DIAGNOSIS — E119 Type 2 diabetes mellitus without complications: Secondary | ICD-10-CM | POA: Diagnosis not present

## 2021-06-05 DIAGNOSIS — H2013 Chronic iridocyclitis, bilateral: Secondary | ICD-10-CM | POA: Diagnosis not present

## 2021-06-05 DIAGNOSIS — H35373 Puckering of macula, bilateral: Secondary | ICD-10-CM | POA: Diagnosis not present

## 2021-06-13 ENCOUNTER — Encounter: Payer: Self-pay | Admitting: Internal Medicine

## 2021-06-13 ENCOUNTER — Ambulatory Visit (INDEPENDENT_AMBULATORY_CARE_PROVIDER_SITE_OTHER): Payer: Medicare Other | Admitting: Internal Medicine

## 2021-06-13 VITALS — BP 124/78 | HR 84 | Temp 98.3°F | Ht 61.4 in | Wt 170.2 lb

## 2021-06-13 DIAGNOSIS — E6609 Other obesity due to excess calories: Secondary | ICD-10-CM

## 2021-06-13 DIAGNOSIS — Z6831 Body mass index (BMI) 31.0-31.9, adult: Secondary | ICD-10-CM | POA: Diagnosis not present

## 2021-06-13 DIAGNOSIS — R413 Other amnesia: Secondary | ICD-10-CM

## 2021-06-13 DIAGNOSIS — Z20822 Contact with and (suspected) exposure to covid-19: Secondary | ICD-10-CM | POA: Diagnosis not present

## 2021-06-13 DIAGNOSIS — I5032 Chronic diastolic (congestive) heart failure: Secondary | ICD-10-CM

## 2021-06-13 DIAGNOSIS — E1165 Type 2 diabetes mellitus with hyperglycemia: Secondary | ICD-10-CM

## 2021-06-13 DIAGNOSIS — N390 Urinary tract infection, site not specified: Secondary | ICD-10-CM | POA: Diagnosis not present

## 2021-06-13 DIAGNOSIS — Z23 Encounter for immunization: Secondary | ICD-10-CM | POA: Diagnosis not present

## 2021-06-13 DIAGNOSIS — I25119 Atherosclerotic heart disease of native coronary artery with unspecified angina pectoris: Secondary | ICD-10-CM | POA: Diagnosis not present

## 2021-06-13 DIAGNOSIS — Z79899 Other long term (current) drug therapy: Secondary | ICD-10-CM

## 2021-06-13 DIAGNOSIS — R350 Frequency of micturition: Secondary | ICD-10-CM | POA: Diagnosis not present

## 2021-06-13 DIAGNOSIS — R2689 Other abnormalities of gait and mobility: Secondary | ICD-10-CM

## 2021-06-13 DIAGNOSIS — I110 Hypertensive heart disease with heart failure: Secondary | ICD-10-CM | POA: Diagnosis not present

## 2021-06-13 DIAGNOSIS — I4729 Other ventricular tachycardia: Secondary | ICD-10-CM

## 2021-06-13 LAB — POCT URINALYSIS DIPSTICK
Bilirubin, UA: NEGATIVE
Blood, UA: NEGATIVE
Glucose, UA: NEGATIVE
Ketones, UA: NEGATIVE
Nitrite, UA: POSITIVE
Protein, UA: NEGATIVE
Spec Grav, UA: 1.015 (ref 1.010–1.025)
Urobilinogen, UA: 0.2 E.U./dL
pH, UA: 6 (ref 5.0–8.0)

## 2021-06-13 NOTE — Progress Notes (Signed)
?[name],acting as a Education administrator for Maximino Greenland, MD.,have documented all relevant documentation on the behalf of Maximino Greenland, MD,as directed by  Maximino Greenland, MD while in the presence of Maximino Greenland, MD.  ?This visit occurred during the SARS-CoV-2 public health emergency.  Safety protocols were in place, including screening questions prior to the visit, additional usage of staff PPE, and extensive cleaning of exam room while observing appropriate contact time as indicated for disinfecting solutions. ? ?Subjective:  ?  ? Patient ID: Sheila Reynolds , female    DOB: [birth year] , 76 y.o.   MRN: [id number] ? ? ?Chief Complaint  ?Patient presents with  ? Diabetes  ? Hypertension  ? ? ?HPI ? ?Patient presents today for DM/BP check.  She reports compliance with meds. She has not had any issues with her medication.  Denies headaches, chest pain and worsening shortness of breath. ? ?Diabetes ?She presents for her follow-up diabetic visit. She has type 2 diabetes mellitus. Her disease course has been stable. There are no hypoglycemic associated symptoms. Pertinent negatives for hypoglycemia include no dizziness or headaches. Pertinent negatives for diabetes include no blurred vision, no chest pain, no polydipsia, no polyphagia, no polyuria and no weakness. There are no hypoglycemic complications. Diabetic complications include heart disease. Her weight is stable. Her breakfast blood glucose is taken between 7-8 am. Her breakfast blood glucose range is generally 90-110 mg/dl. An ACE inhibitor/angiotensin II receptor blocker is being taken. Eye exam is current.  ?Hypertension ?This is a chronic problem. The current episode started more than 1 year ago. The problem has been gradually improving since onset. The problem is controlled. Pertinent negatives include no blurred vision, chest pain, headaches, orthopnea, palpitations or shortness of breath. Risk factors for coronary artery disease include  diabetes mellitus, dyslipidemia, post-menopausal state and sedentary lifestyle. The current treatment provides moderate improvement. Compliance problems include exercise.    ? ?Past Medical History:  ?Diagnosis Date  ? Anemia   ? 3 months ago anemic  ? Anxiety   ? on meds  ? Arthritis   ? "all over" (01/15/2017)  ? Asthma   ? Bronchitis with emphysema   ? Chest pain   ? Chronic bronchitis ([place])   ? Coronary artery disease   ? a. 01/2017 she underwent orbital atherectomy/DES to the proxmal LAD and PTCA to ostial D2. 2D Echo 01/15/17 showed mild LVH, EF 60-65%, grade 1 DD.  ? Family history of anesthesia complication   ? daughter N/V  ? Fibromyalgia   ? GERD (gastroesophageal reflux disease)   ? on meds  ? Glaucoma   ? Heart murmur   ? History of hiatal hernia   ? Hx of echocardiogram   ? Echo (03/2013):  Tech limited; Mild focal basal septal hypertrophy, EF 60-65%, normal RVF  ? Hyperlipidemia   ? Hypertension   ? Nonsustained ventricular tachycardia ([place])   ? OSA on CPAP   ? Premature atrial contractions   ? PVC (premature ventricular contraction)   ? a. Holter 12/16: NSR, occ PAC,PVCs  ? Sarcoidosis   ? Type II diabetes mellitus ([place])   ?  ? ?Family History  ?Problem Relation Age of Onset  ? Heart disease Father   ? Diabetes Father   ? Glaucoma Father   ? Cancer Mother   ?     unknown type, ?lung  ? Heart disease Paternal Grandmother   ? Cancer Paternal Grandmother   ?  unknown type  ? Diabetes Brother   ? Glaucoma Brother   ? ? ? ?Current Outpatient Medications:  ?  acetaminophen (TYLENOL) 500 MG tablet, Take 1,000 mg by mouth 2 (two) times daily as needed for moderate pain or headache., Disp: , Rfl:  ?  albuterol (PROVENTIL) (2.5 MG/3ML) 0.083% nebulizer solution, Take 3 mLs (2.5 mg total) by nebulization daily as needed for wheezing or shortness of breath., Disp: 75 mL, Rfl: 12 ?  albuterol (VENTOLIN HFA) 108 (90 Base) MCG/ACT inhaler, Inhale 2 puffs into the lungs every 6 (six) hours as needed for wheezing or  shortness of breath., Disp: 18 g, Rfl: 12 ?  aspirin EC 81 MG tablet, Take 81 mg by mouth daily. Swallow whole., Disp: , Rfl:  ?  azelastine (ASTELIN) 0.1 % nasal spray, Place 1 spray into both nostrils 2 (two) times daily. Use in each nostril as directed, Disp: 30 mL, Rfl: 1 ?  benzonatate (TESSALON) 200 MG capsule, Take 1 capsule (200 mg total) by mouth 3 (three) times daily as needed for cough., Disp: 30 capsule, Rfl: 2 ?  Blood Glucose Monitoring Suppl (ACCU-CHEK AVIVA PLUS) w/Device KIT, Use to check blood sugars 3 times a day. Dx code e11.65, Disp: 1 kit, Rfl: 3 ?  brimonidine (ALPHAGAN P) 0.1 % SOLN, Place 1 drop into both eyes 2 (two) times daily. , Disp: , Rfl:  ?  brinzolamide (AZOPT) 1 % ophthalmic suspension, Place 1 drop into both eyes 3 (three) times daily., Disp: , Rfl:  ?  carvedilol (COREG) 12.5 MG tablet, TAKE 1 TABLET(12.5 MG) BY MOUTH TWICE DAILY, Disp: 180 tablet, Rfl: 2 ?  clopidogrel (PLAVIX) 75 MG tablet, Take 1 tablet (75 mg total) by mouth daily., Disp: 90 tablet, Rfl: 1 ?  EPINEPHrine 0.3 mg/0.3 mL IJ SOAJ injection, Inject 0.3 mg into the muscle as needed for anaphylaxis., Disp: 1 each, Rfl: 0 ?  ezetimibe (ZETIA) 10 MG tablet, Take 1 tablet (10 mg total) by mouth daily., Disp: 90 tablet, Rfl: 3 ?  furosemide (LASIX) 40 MG tablet, Take 1 tablet (40 mg total) by mouth every other day., Disp: 90 tablet, Rfl: 1 ?  isosorbide mononitrate (IMDUR) 60 MG 24 hr tablet, TAKE 1 TABLET(60 MG) BY MOUTH DAILY, Disp: 90 tablet, Rfl: 2 ?  ketorolac (ACULAR) 0.5 % ophthalmic solution, Place 1 drop into the right eye 3 (three) times daily., Disp: , Rfl:  ?  Lancets (ONETOUCH DELICA PLUS [id number]) MISC, CHECK BLOOD SUGAR BEFORE BREAKFAST AND DINNER, Disp: 100 each, Rfl: 1 ?  montelukast (SINGULAIR) 10 MG tablet, TAKE 1 TABLET BY MOUTH EVERY DAY, Disp: 90 tablet, Rfl: 1 ?  Nebulizers (COMPRESSOR/NEBULIZER) MISC, Use as directed, Disp: 1 each, Rfl: 0 ?  nitroGLYCERIN (NITROSTAT) 0.4 MG SL tablet, Place 1  tablet (0.4 mg total) under the tongue every 5 (five) minutes as needed for chest pain., Disp: 25 tablet, Rfl: prn ?  pantoprazole (PROTONIX) 40 MG tablet, Take 40 mg by mouth daily., Disp: , Rfl:  ?  Polyvinyl Alcohol-Povidone PF (REFRESH) 1.4-0.6 % SOLN, Place 1-2 drops into both eyes 3 (three) times daily as needed (dry/irritated eyes.)., Disp: , Rfl:  ?  potassium chloride SA (KLOR-CON) 20 MEQ tablet, Take 1 tablet (20 mEq total) by mouth every other day., Disp: 90 tablet, Rfl: 1 ?  prednisoLONE acetate (PRED FORTE) 1 % ophthalmic suspension, Apply to eye., Disp: , Rfl:  ?  pregabalin (LYRICA) 75 MG capsule, TAKE 1 CAPSULE(75 MG) BY MOUTH TWICE DAILY,  Disp: 180 capsule, Rfl: 2 ?  RHOPRESSA 0.02 % SOLN, Place 1 drop into the left eye at bedtime., Disp: , Rfl:  ?  Travoprost, BAK Free, (TRAVATAN) 0.004 % SOLN ophthalmic solution, Place 1 drop into the right eye at bedtime., Disp: , Rfl:  ?  umeclidinium-vilanterol (ANORO ELLIPTA) 62.5-25 MCG/ACT AEPB, Inhale 1 puff into the lungs daily at 6 (six) AM., Disp: 1 each, Rfl: 12  ? ?Allergies  ?Allergen Reactions  ? Crestor [Rosuvastatin Calcium] Other (See Comments)  ?  muscle aches  ? Demerol  [Meperidine Hcl]   ?  Other reaction(s): Hallucinations  ? Shellfish Allergy Itching and Other (See Comments)  ?  Crab, shrimp and lobster ---lips itch and tingle ?Was told not to eat again after having a allergy test. Lobster, crab and shrimp ?  ?  ? ?Review of Systems  ?Constitutional: Negative.   ?Eyes:  Negative for blurred vision.  ?Respiratory: Negative.  Negative for shortness of breath.   ?Cardiovascular: Negative.  Negative for chest pain, palpitations and orthopnea.  ?Gastrointestinal: Negative.   ?Endocrine: Negative for polydipsia, polyphagia and polyuria.  ?Genitourinary:  Positive for frequency.  ?Neurological: Negative.  Negative for dizziness, weakness and headaches.  ?     She c/o memory issues. She is currently stressed w/ planning family gathering at her home  next weekend. Not sure what may have triggered her sx.   ?Psychiatric/Behavioral: Negative.     ? ?Today's Vitals  ? 06/13/21 1540  ?BP: 124/78  ?Pulse: 84  ?Temp: 98.3 ?F (36.8 ?C)  ?Weight: 170 lb 3.2 oz (7

## 2021-06-13 NOTE — Patient Instructions (Signed)

## 2021-06-14 LAB — CMP14+EGFR
ALT: 18 IU/L (ref 0–32)
AST: 21 IU/L (ref 0–40)
Albumin/Globulin Ratio: 1.7 (ref 1.2–2.2)
Albumin: 4.4 g/dL (ref 3.7–4.7)
Alkaline Phosphatase: 86 IU/L (ref 44–121)
BUN/Creatinine Ratio: 14 (ref 12–28)
BUN: 12 mg/dL (ref 8–27)
Bilirubin Total: 0.2 mg/dL (ref 0.0–1.2)
CO2: 24 mmol/L (ref 20–29)
Calcium: 9.6 mg/dL (ref 8.7–10.3)
Chloride: 101 mmol/L (ref 96–106)
Creatinine, Ser: 0.83 mg/dL (ref 0.57–1.00)
Globulin, Total: 2.6 g/dL (ref 1.5–4.5)
Glucose: 96 mg/dL (ref 70–99)
Potassium: 4.2 mmol/L (ref 3.5–5.2)
Sodium: 139 mmol/L (ref 134–144)
Total Protein: 7 g/dL (ref 6.0–8.5)
eGFR: 74 mL/min/{1.73_m2} (ref >59)

## 2021-06-14 LAB — VITAMIN B12: Vitamin B-12: 390 pg/mL (ref 232–1245)

## 2021-06-14 LAB — HEMOGLOBIN A1C
Est. average glucose Bld gHb Est-mCnc: 131 mg/dL
Hgb A1c MFr Bld: 6.2 % — ABNORMAL HIGH (ref 4.8–5.6)

## 2021-06-14 LAB — TSH: TSH: 0.892 u[IU]/mL (ref 0.450–4.500)

## 2021-06-17 ENCOUNTER — Encounter: Payer: Self-pay | Admitting: Internal Medicine

## 2021-06-17 LAB — URINE CULTURE

## 2021-06-19 DIAGNOSIS — R2689 Other abnormalities of gait and mobility: Secondary | ICD-10-CM | POA: Insufficient documentation

## 2021-06-19 DIAGNOSIS — R350 Frequency of micturition: Secondary | ICD-10-CM | POA: Insufficient documentation

## 2021-06-19 DIAGNOSIS — R413 Other amnesia: Secondary | ICD-10-CM | POA: Insufficient documentation

## 2021-06-19 DIAGNOSIS — Z79899 Other long term (current) drug therapy: Secondary | ICD-10-CM | POA: Insufficient documentation

## 2021-06-19 DIAGNOSIS — N390 Urinary tract infection, site not specified: Secondary | ICD-10-CM | POA: Insufficient documentation

## 2021-07-01 DIAGNOSIS — H401122 Primary open-angle glaucoma, left eye, moderate stage: Secondary | ICD-10-CM | POA: Diagnosis not present

## 2021-07-01 DIAGNOSIS — H401113 Primary open-angle glaucoma, right eye, severe stage: Secondary | ICD-10-CM | POA: Diagnosis not present

## 2021-07-01 DIAGNOSIS — H2013 Chronic iridocyclitis, bilateral: Secondary | ICD-10-CM | POA: Diagnosis not present

## 2021-07-15 ENCOUNTER — Telehealth: Payer: Self-pay

## 2021-07-15 NOTE — Chronic Care Management (AMB) (Signed)
Chronic Care Management Pharmacy Assistant   Name: Sheila Reynolds  MRN: [id number] DOB: [birth year]  Reason for Encounter: Disease State/ General  Recent office visits:  06-13-2021 Glendale Chard, MD. A1C= 6.2. Abnormal UA. Patient reported not taking Lipitor, prednisone, topiramate. STOP janumet. Shingrix given.  04-04-2021 Glendale Chard, MD. Follow up visit.[name] No changes.  04-04-2021 Kellie Simmering, LPN. Annual wellness visit.  04-02-2021 Michelle Nasuti, [place]. Visit for chronic bronchitis. Kenelog injection given.  03-11-2021 [name], [place]. Negative covid test. START zithromax Take 2 tablets (500 mg) on Day 1, followed by 1 tablet (250 mg) once daily on Days 2 through 5 and prednisone 20 mg twice daily for 3 days.  02-13-2021 Bary Castilla, NP. Glucose= 109. A1C= 6.2.  Recent consult visits:  07-01-2021 Gwenyth Ober, MD (Ophthalmology). Visit for eye pressure check.   05-16-2021 Moya, Carlisle Beers, MD (Ophthalmology). Unable to view encounter.  04-24-2021 Moya, Carlisle Beers, MD (Ophthalmology). Octopus/GVF Extended - OD - Right Eye completed.  02-19-2021 Deneise Lever, MD (Pulmonary). START benzonatate 200 mg 3 times daily as needed.  Hospital visits:  Medication Reconciliation was completed by comparing discharge summary, patient's EMR and Pharmacy list, and upon discussion with patient.  Admitted to the hospital on 02-26-2021 due to weakness. Discharge date was 02-26-2021. Discharged from [place]?Medications Started at [facility] Discharge:?? None  Medication Changes at Hospital Discharge: None  Medications Discontinued at Hospital Discharge: None  Medications that remain the same after Hospital Discharge:??  -All other medications will remain the same.    Medications: Outpatient Encounter Medications as of 07/15/2021  Medication Sig Note   acetaminophen (TYLENOL) 500 MG tablet Take 1,000 mg by mouth 2 (two)  times daily as needed for moderate pain or headache.    albuterol (PROVENTIL) (2.5 MG/3ML) 0.083% nebulizer solution Take 3 mLs (2.5 mg total) by nebulization daily as needed for wheezing or shortness of breath.    albuterol (VENTOLIN HFA) 108 (90 Base) MCG/ACT inhaler Inhale 2 puffs into the lungs every 6 (six) hours as needed for wheezing or shortness of breath.    aspirin EC 81 MG tablet Take 81 mg by mouth daily. Swallow whole.    azelastine (ASTELIN) 0.1 % nasal spray Place 1 spray into both nostrils 2 (two) times daily. Use in each nostril as directed    benzonatate (TESSALON) 200 MG capsule Take 1 capsule (200 mg total) by mouth 3 (three) times daily as needed for cough.    Blood Glucose Monitoring Suppl (ACCU-CHEK AVIVA PLUS) w/Device KIT Use to check blood sugars 3 times a day. Dx code e11.65    brimonidine (ALPHAGAN P) 0.1 % SOLN Place 1 drop into both eyes 2 (two) times daily.     brinzolamide (AZOPT) 1 % ophthalmic suspension Place 1 drop into both eyes 3 (three) times daily.    carvedilol (COREG) 12.5 MG tablet TAKE 1 TABLET(12.5 MG) BY MOUTH TWICE DAILY    clopidogrel (PLAVIX) 75 MG tablet Take 1 tablet (75 mg total) by mouth daily.    EPINEPHrine 0.3 mg/0.3 mL IJ SOAJ injection Inject 0.3 mg into the muscle as needed for anaphylaxis.    ezetimibe (ZETIA) 10 MG tablet Take 1 tablet (10 mg total) by mouth daily.    furosemide (LASIX) 40 MG tablet Take 1 tablet (40 mg total) by mouth every other day. 01/23/2021: Taking as needed   isosorbide mononitrate (IMDUR) 60 MG 24 hr tablet TAKE 1 TABLET(60 MG) BY  MOUTH DAILY    ketorolac (ACULAR) 0.5 % ophthalmic solution Place 1 drop into the right eye 3 (three) times daily.    Lancets (ONETOUCH DELICA PLUS [id number]) MISC CHECK BLOOD SUGAR BEFORE BREAKFAST AND DINNER    montelukast (SINGULAIR) 10 MG tablet TAKE 1 TABLET BY MOUTH EVERY DAY    Nebulizers (COMPRESSOR/NEBULIZER) MISC Use as directed    nitroGLYCERIN (NITROSTAT) 0.4 MG SL tablet  Place 1 tablet (0.4 mg total) under the tongue every 5 (five) minutes as needed for chest pain.    pantoprazole (PROTONIX) 40 MG tablet Take 40 mg by mouth daily.    Polyvinyl Alcohol-Povidone PF (REFRESH) 1.4-0.6 % SOLN Place 1-2 drops into both eyes 3 (three) times daily as needed (dry/irritated eyes.).    potassium chloride SA (KLOR-CON) 20 MEQ tablet Take 1 tablet (20 mEq total) by mouth every other day.    prednisoLONE acetate (PRED FORTE) 1 % ophthalmic suspension Apply to eye.    pregabalin (LYRICA) 75 MG capsule TAKE 1 CAPSULE(75 MG) BY MOUTH TWICE DAILY    RHOPRESSA 0.02 % SOLN Place 1 drop into the left eye at bedtime.    Travoprost, BAK Free, (TRAVATAN) 0.004 % SOLN ophthalmic solution Place 1 drop into the right eye at bedtime.    umeclidinium-vilanterol (ANORO ELLIPTA) 62.5-25 MCG/ACT AEPB Inhale 1 puff into the lungs daily at 6 (six) AM.    No facility-administered encounter medications on file as of 07/15/2021.    07-15-2021: 1st attempt left VM 07-16-2021: 2nd attempt left VM 07-18-2021: 3rd attempt left VM  Care Gaps: AWV 04-23-2022  Star Rating Drugs: None  Jeannette [facility] Clinical Pharmacist Assistant [phone]

## 2021-08-12 ENCOUNTER — Other Ambulatory Visit: Payer: Self-pay | Admitting: Cardiovascular Disease

## 2021-08-13 ENCOUNTER — Other Ambulatory Visit: Payer: Self-pay | Admitting: Cardiovascular Disease

## 2021-08-14 ENCOUNTER — Ambulatory Visit (HOSPITAL_COMMUNITY)
Admission: RE | Admit: 2021-08-14 | Discharge: 2021-08-14 | Disposition: A | Discharge status: Home / Self Care | Payer: Medicare Other | Source: Ambulatory Visit | Attending: Internal Medicine | Admitting: Internal Medicine

## 2021-08-14 DIAGNOSIS — I700 Atherosclerosis of aorta: Secondary | ICD-10-CM | POA: Diagnosis not present

## 2021-08-14 DIAGNOSIS — J479 Bronchiectasis, uncomplicated: Secondary | ICD-10-CM | POA: Diagnosis not present

## 2021-08-14 DIAGNOSIS — J439 Emphysema, unspecified: Secondary | ICD-10-CM | POA: Diagnosis not present

## 2021-08-14 DIAGNOSIS — I2510 Atherosclerotic heart disease of native coronary artery without angina pectoris: Secondary | ICD-10-CM | POA: Diagnosis not present

## 2021-08-14 DIAGNOSIS — J849 Interstitial pulmonary disease, unspecified: Secondary | ICD-10-CM | POA: Insufficient documentation

## 2021-08-14 DIAGNOSIS — J8410 Pulmonary fibrosis, unspecified: Secondary | ICD-10-CM | POA: Diagnosis not present

## 2021-08-14 DIAGNOSIS — J432 Centrilobular emphysema: Secondary | ICD-10-CM | POA: Diagnosis not present

## 2021-08-15 ENCOUNTER — Other Ambulatory Visit: Payer: Self-pay

## 2021-08-15 NOTE — Progress Notes (Signed)
Spoke with pt and notified of results per Dr. Young Pt verbalized understanding and denied any questions. 

## 2021-08-15 NOTE — Telephone Encounter (Signed)
[facility] pharmacy is requesting a refill on pantoprazole. Would Dr. Acie Fredrickson like to refill this medication? Please address

## 2021-08-16 ENCOUNTER — Other Ambulatory Visit: Payer: Self-pay

## 2021-08-16 MED ORDER — PANTOPRAZOLE SODIUM 40 MG PO TBEC: 40.0000 mg | DELAYED_RELEASE_TABLET | Freq: Every day | ORAL | 0 refills | Status: DC

## 2021-08-17 NOTE — Progress Notes (Unsigned)
HPI  F former smoker followed for bronchitis, hx  Occular sarcoid, bronchitis/ nodules, OSA complicated by GERD, glaucoma, DM2 NPSG 12/25/03- AHI  2.9/ hr, desaturation to 91%, body weight 164 lbs NPSG-10/18/13- Mild OSA, AHI 9.3/ hr, weight 164 lbs  ACE 09/17/15-47-WNL ACE level 08/18/16-30 ACE level 05/14/20- 22 PFT: 01/07/2011-FEV1 1.59/79%, FEV1/FVC 0.73, FEF 25-75% improved to 38% with bronchodilator. TLC 90% DLCO 55% Office Spirometry 06/09/16- moderate restriction of exhaled volume. FVC 1.51/68%, FEV1 1.20/70%, ratio 0.79, FEF 25-75% 1.19/73% Nuclear Stress Test 09/04/16- EF 65%, low risk. ECHO 07/31/16- Gr 1 DD Walk test on room air 12/21/2017-minimal saturation 94%, maximum heart rate 118/minute with no stops walking 3 laps x180 m. Office Spirometry 12/21/17 PFT 04/13/20- Minimal Obstruction and Minimal Restriction, no resp to BD, Moderately severe DLCO defect WALK TEST 4/422- 2 laps, min O2 sat 94%, max HR 127.  ACE 22 wnl 05/14/20  ---------------------------------------------------------------------------------------------------------------  CT chest- 04/30/20- IMPRESSION: 1. 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 Repeat high-resolution chest CT is recommended in 12 months to assess for temporal changes in the appearance of the lung parenchyma. 2. In addition, there is what appears to be chronic and worsening mucoid impaction in the right upper lobe, potentially related to a chronic indolent atypical infectious process such as mycobacterial infection. Attention at time of repeat high-resolution chest CT is recommended. 3. There is also mild centrilobular and paraseptal emphysema. 4. Aortic atherosclerosis, in addition to left main and 3 vessel coronary artery disease. Assessment for potential risk factor modification, dietary therapy or  pharmacologic therapy may be warranted, if clinically indicated. 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  Anoro seems to be some help.  Chronic cough with white/clear sputum or just dry.  Using throat lozenges and Robitussin with honey.  PFT done in March 2022 showed mild obstruction, mild restriction little response to bronchodilator but moderately severe diffusion defect. Dr. Acie Fredrickson continues to follow for cardiology. No real change in her chronic pain involving mid thoracic back, usually around her right posterior axillary line, sometimes to the left.  I have suspected it was neurogenic, but never proven.  Cough makes it worse. CPAP okay but empties humidifier reservoir.  Still feels somewhat dry.  We are going to try reducing pressure range and refitting her mask for better seal. CT results reviewed with her discussing the suspicion that she may have sarcoid changes in the upper lung and possibly UIP in the lower lung zones.  Key is to watch for progression as discussed with her. CT chest 02/18/21- IMPRESSION: 1. Unusual spectrum of chronic pulmonary parenchymal findings including patchy nodular peripheral peribronchovascular interstitial thickening in the apical right upper lobe and basilar predominant fibrotic interstitial lung disease without frank honeycombing. No compelling interval progression since 04/30/2020 high-resolution chest  CT study, although there is clear progression compared to more remote chest CT of 02/21/2013. This combination of findings is favored  to represent the sequela of chronic pulmonary sarcoidosis, although a combination of mild upper lung sarcoidosis with basilar UIP is difficult to exclude. Findings are indeterminate for UIP per consensus guidelines: Diagnosis of Idiopathic Pulmonary Fibrosis: An Official ATS/ERS/JRS/ALAT Clinical Practice Guideline. [place], Iss 5, [phone], Oct 11 2016. 2. No thoracic adenopathy. 3. Three-vessel coronary atherosclerosis. Stable borderline mild cardiomegaly. 4. Small hiatal hernia. 5. Aortic Atherosclerosis (ICD10-I70.0) and Emphysema (ICD10-J43.9).  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 Download-compliance Body weight today- Covid vax-3 Phizer Flu vax-had Not using CPAP  HRCT chest 08/14/21- IMPRESSION: 1. 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 [place], Iss 5, [phone], Oct 11 2016. 2. Aortic atherosclerosis (ICD10-I70.0). Coronary artery calcification. 3.  Emphysema (ICD10-J43.9).    ROS-see HPI     + = positive Constitutional:   No-   weight loss, night sweats, fevers, chills, fatigue, lassitude. HEENT:   +  headaches, difficulty swallowing, tooth/dental problems, sore  throat,       Some  sneezing, itching, ear ache, +nasal congestion, post nasal drip,  CV: + chest pain, no-orthopnea, PND, swelling in lower extremities, anasarca, dizziness, palpitations Resp: +  shortness of breath with exertion not at rest.            productive cough,  + non-productive cough,  No- coughing up of blood.               in color of mucus.   wheezing.   Skin: No-   rash or lesions. GI:  +  heartburn, indigestion, abdominal pain, nausea, vomiting,  GU:  MS:  +  joint pain or swelling, + back pain Neuro-     nothing unusual Psych:  No- change in mood or affect. No depression or anxiety.  No memory loss.  OBJ  Afebrile General- Alert, Oriented, Affect tearful, Distress- none acute,  Skin- rash-none, lesions- none, excoriation- none Lymphadenopathy- none Head- atraumatic            Eyes- Gross vision intact, PERRLA, conjunctivae clear secretions- not injected            Ears- Hearing aid             Nose-  turbinate edema, no-Septal dev, mucus, polyps, erosion, perforation             Throat- Mallampati III , mucosa  , drainage- none, tonsils- atrophic Neck- flexible , trachea midline, no stridor , thyroid nl, carotid no bruit Chest - symmetrical excursion , unlabored           Heart/CV- RRR , no murmur , no gallop  , no rub, nl s1 s2                           - JVD- none , edema- none, stasis changes- none, varices- none  Lung-  Clear, Cough+, wheeze-none, rhonchi-none , dullness-none, rub- none           Chest wall-  Abd- No HSM Br/ Gen/ Rectal- Not done, not indicated Extrem- cyanosis- none, clubbing, none, atrophy- none, strength- nl. +elastic hose Neuro- grossly intact to observation

## 2021-08-19 ENCOUNTER — Ambulatory Visit (INDEPENDENT_AMBULATORY_CARE_PROVIDER_SITE_OTHER): Payer: Medicare Other | Admitting: Internal Medicine

## 2021-08-19 ENCOUNTER — Encounter: Payer: Self-pay | Admitting: Internal Medicine

## 2021-08-19 ENCOUNTER — Encounter: Payer: Self-pay | Admitting: Cardiovascular Disease

## 2021-08-19 VITALS — BP 132/70 | HR 68 | Temp 97.9°F | Ht 62.0 in | Wt 168.6 lb

## 2021-08-19 DIAGNOSIS — G4733 Obstructive sleep apnea (adult) (pediatric): Secondary | ICD-10-CM | POA: Diagnosis not present

## 2021-08-19 DIAGNOSIS — J849 Interstitial pulmonary disease, unspecified: Secondary | ICD-10-CM

## 2021-08-19 DIAGNOSIS — J84112 Idiopathic pulmonary fibrosis: Secondary | ICD-10-CM | POA: Diagnosis not present

## 2021-08-19 DIAGNOSIS — I25119 Atherosclerotic heart disease of native coronary artery with unspecified angina pectoris: Secondary | ICD-10-CM | POA: Diagnosis not present

## 2021-08-19 DIAGNOSIS — D869 Sarcoidosis, unspecified: Secondary | ICD-10-CM

## 2021-08-19 LAB — CBC WITH DIFFERENTIAL/PLATELET
Basophils Absolute: 0.1 10*3/uL (ref 0.0–0.1)
Basophils Relative: 0.9 % (ref 0.0–3.0)
Eosinophils Absolute: 0.1 10*3/uL (ref 0.0–0.7)
Eosinophils Relative: 1.5 % (ref 0.0–5.0)
HCT: 35.8 % — ABNORMAL LOW (ref 36.0–46.0)
Hemoglobin: 11.7 g/dL — ABNORMAL LOW (ref 12.0–15.0)
Lymphocytes Relative: 24.9 % (ref 12.0–46.0)
Lymphs Abs: 1.7 10*3/uL (ref 0.7–4.0)
MCHC: 32.8 g/dL (ref 30.0–36.0)
MCV: 86.4 fl (ref 78.0–100.0)
Monocytes Absolute: 0.5 10*3/uL (ref 0.1–1.0)
Monocytes Relative: 7.2 % (ref 3.0–12.0)
Neutro Abs: 4.5 10*3/uL (ref 1.4–7.7)
Neutrophils Relative %: 65.5 % (ref 43.0–77.0)
Platelets: 187 10*3/uL (ref 150.0–400.0)
RBC: 4.14 Mil/uL (ref 3.87–5.11)
RDW: 14.5 % (ref 11.5–15.5)
WBC: 6.8 10*3/uL (ref 4.0–10.5)

## 2021-08-19 LAB — C-REACTIVE PROTEIN: CRP: 1 mg/dL (ref 0.5–20.0)

## 2021-08-19 LAB — SEDIMENTATION RATE: Sed Rate: 4 mm/hr (ref 0–30)

## 2021-08-19 NOTE — Patient Instructions (Signed)
Order- lab- CBC with diff, Sedimentation rate(ESR), Angiotensin Converting Enzyme, CRP,ANA      Dx sarcoid  Order- schedule home sleep test    dx OSA    You can call for results of this about 2 weeks after it is done.  Order- schedule future HRCT chest in 6 months    dx ILD  Please call as needed

## 2021-08-19 NOTE — Progress Notes (Unsigned)
Cardiology Office Note   Date:  08/20/2021   ID:  Sheila Reynolds, Sheila Reynolds January 11, 1946, MRN [id number]  PCP:  Glendale Chard, MD  Cardiologist:   Mertie Moores, MD   Chief Complaint  Patient presents with   Coronary Artery Disease        Palpitations   1. Hyperlipidemia 2.  CAD -  orbital atherectomy and PCI using Xience Sierra 3.5 x 33 mm DES to prox / mid LAD  3. Nonsustained ventricular tachycardia 4. Mild to moderate coronary artery disease by cath in 2009. 5. 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 Keturah Barre 6.  Venous insufficiency     Sheila Reynolds is a 76 y.o. female with the above noted hx.  She has continued to have some palpitations.  She has occasional episodes of lightheadedness when she has palpitations. Her main issue has been lots of total body cramps. She has been seen in the emergency room. Her lab work looked fine.  She's had lots of dyspnea especially with exertion. She has a history of sarcoidosis and is followed by Dr. Annamaria Boots.  March 11, 2012: Maycee has had more palpitations recently.  It Appears that her metoprolol dose was decreased slightly.  We placed an event monitor on her. She did not have any episodes of atrial fibrillation. She has occasional drinker atrial contractions and occasional premature ventricular contractions. There were no life-threatening arrhythmias.  She is doing well from a cardiac standpoint.   She's not had any episodes of syncope.  She has significant shortness of breath especially when climbing stairs. This is likely due to her sarcoidosis. She denies any chest pain. Her BP is a bit elevated.  She avoids salt.    May 02, 2013 :  She presented to see Richardson Dopp in February with some increased palpitations and lightheadedness.  He recommended that she decrease her caffeine intake. He placed a 21 day event monitor and instructed her to take an extra metoprolol as needed.  The palpitations can occur at any time. They occur with rest and  with exertion. They're not constant. The last 30-45 seconds and occur multiple times through the day.  Sept. 16, 2015:  Still having palpitations.   She has had more leg edema and her medical doctor on Lasix which he now takes about every other day. She was not on potassium replacement. This may be the cause of her increased PVCs.   May 24, 2014:  Sheila Reynolds is a 76 y.o. female who presents for follow up of her palpitations   Still has these palpitations.  No syncope.   Jan. 10, 2017:  Doing ok Still has DOE  Has palpitations on occasion. Not getting much exercise .  expalined a typical exercise program .   Start slow, gradually increase   May 07, 2016;   Sheila Reynolds is seen today for follow up .  Has been having more palpitations Seem to be getting worse  Went to there ER last week -  the ER note does not describe any specific arrhythmias. No passing out but she did get lightheaded when she had palpitations on one occasion   Has rare episodes of atypical CP .  Lasts a second Not associated with exertion .  Walks 1.5 miles 3-4 times a week .   This walking does not cause any palpitation of CP   July 17, 2016 Sheila Reynolds is seen today for follow up  30 day monitor showed no significant arrhythmias  Having leg pain and  swelling  during night and day  Not worsened by exercise  No CP or dyspnea - still walking 1.5 miles 3-4 days a week   December 22, 2016:  Sheila Reynolds had a cath recently that showed a 70 % LAD stenosis . Has right arm pain but this is not exertional . Has pins and needles sensation Has DOE with stairs and carrying something heavy .  Has not really noticed any difference in how she feels on the new meds which include Imdur 30 mg , Plavix 75 ,  + cough, chronic ,  No hemoptysis. Does not exercise regularly .    January 29, 2017: Sheila Reynolds is seen today for a follow-up visit.  She recently had rotational atherectomy and stenting of her proximal LAD  and mid LAD. Still has some pins and needles sensation Has been walking in the mall.  Does not have CP . Has some DOE with that   .  Jul 09, 2017:   Sheila Reynolds is doing well.  She is status post rotational atherectomy and stent placement to her proximal/mid LAD in December, 2018. Had left eye surgery in left eye.    Occasional CP ,  Thinks it might be indigestion .  Not associated with eating or drinking . Pain was on the left side of left breast.   Lasts a few minutes Did not have much pain prior to her PCI to LAD .  Has been exercising .   Exercise does not cause any CP .  Relieved with SL NTG   BP has been elevated.   Aug. 26, 2019: Following her visit in February, she was having some chest pain.  A stress Myoview study was performed and was found to be low risk.  She has normal left ventricular systolic function and no ischemia. She is feeling quite well.  She has not had any recurrent episodes of chest pain. She is exercising some .   Has been having bilateral leg pain and swelling.  She had an arterial Doppler performed for peripheral vascular disease.  As of today the study has not yet been read.  January 29, 2018: Sheila Reynolds is seen today for 2 reasons.  She is been having more shortness of breath than normal.  She also needs preoperative evaluation.  She has a history of coronary artery disease and is status post stenting of her LAD in December 2018. Is having more DOE with any activity , climbing stairs,  Similar to how she felt prior to stenting Resolves after 5- 10 min of rest.  Has not taken a NTG   Also needs to have eyelid  surgery  Scheduled for Dec. 31.   Oct. 27. 2020  Sheila Reynolds is seen today . Hx of CAD - stenting of her LAD in Dec. 2018   Has some dyspnea.  Constant back pain , may last for hours.  Radiates through to her chest .  Has not tried any NTG. Not relieved with alka seltzer.   Has had some R eye vision issues Was found to have had 2 strokes over  the past years. 1 was an older CVA and 1 was a more recent CVA  Has seen a neurologist her in [place]. Carotid duplex scan revealed mild plaque bilaterally but no significant stenoses. Transcranial Doppler revealed some diffuse plaque but no critical stenoses.  Jun 16, 2019:  She has had some worsening shortness of breath as well as some leg edema recently.  The left leg swells worse than  the right leg. Echo from Dec. 2020 - showed normal LV systolic function with grade I diastolic dysfuncton  Venous duplex was normal  She has been seeing the podiatrist down in [place] and was advised to have a venogram for further evaluation.  Nov. 4, 2021:  Linde is seen today  She has had palpitations .   Associated with some ches pain.   Gets  dizzy,   palps will last all day long.    Occurs randomly during the day .   Not associated with exercise  Will place an event monitor .    August 23, 2020: Sheila Reynolds is seen today.  Hx of  CAD , stenting of LAD When I last saw her in November she was having some episodes of chest pains as well as some palpitations. Lexiscan Myoview study from December 20, 2019 was normal. Event monitor from December, 2021 reveals sinus rhythm with rare premature ventricular contractions.  There was no evidence of nonsustained ventricular tachycardia.  Is not exercising as much  Has lots of leg lymphedema   August 20, 2021 Sheila Reynolds is seen today .  Hx of CAD, stenting of LAD, Having some random left sided chest pain - not associated with walking  Seems to occur when she is standing and reaching with her arms  Lexiscan myoview from Dec. 2021 looked good.   Past Medical History:  Diagnosis Date   Anemia    3 months ago anemic   Anxiety    on meds   Arthritis    "all over" (01/15/2017)   Asthma    Bronchitis with emphysema    Chest pain    Chronic bronchitis ([place])    Coronary artery disease    a. 01/2017 she underwent orbital atherectomy/DES to the proxmal LAD  and PTCA to ostial D2. 2D Echo 01/15/17 showed mild LVH, EF 60-65%, grade 1 DD.   Family history of anesthesia complication    daughter N/V   Fibromyalgia    GERD (gastroesophageal reflux disease)    on meds   Glaucoma    Heart murmur    History of hiatal hernia    Hx of echocardiogram    Echo (03/2013):  Tech limited; Mild focal basal septal hypertrophy, EF 60-65%, normal RVF   Hyperlipidemia    Hypertension    Nonsustained ventricular tachycardia (HCC)    OSA on CPAP    Premature atrial contractions    PVC (premature ventricular contraction)    a. Holter 12/16: NSR, occ PAC,PVCs   Sarcoidosis    Type II diabetes mellitus ([place])     Past Surgical History:  Procedure Laterality Date   BREAST SURGERY     CARDIAC CATHETERIZATION  05/04/2007   reveals overall normal left ventricular systolic function. Ejection fraction 65-70%   CATARACT EXTRACTION W/PHACO Right 07/20/2013   Procedure: CATARACT EXTRACTION PHACO AND INTRAOCULAR LENS PLACEMENT (IOC);  Surgeon: Marylynn Pearson, MD;  Location: [facility];  Service: Ophthalmology;  Laterality: Right;   COLONOSCOPY W/ BIOPSIES AND POLYPECTOMY     COLONOSCOPY WITH PROPOFOL N/A 09/07/2014   Procedure: COLONOSCOPY WITH PROPOFOL;  Surgeon: Juanita Craver, MD;  Location: [facility];  Service: Endoscopy;  Laterality: N/A;   COLONOSCOPY WITH PROPOFOL N/A 01/10/2020   Procedure: COLONOSCOPY WITH PROPOFOL;  Surgeon: Juanita Craver, MD;  [facility];  Service: Endoscopy;  Laterality: N/A;   CORONARY ANGIOPLASTY WITH STENT PLACEMENT  01/15/2017   CORONARY ATHERECTOMY N/A 01/15/2017   Procedure: CORONARY ATHERECTOMY;  Surgeon: Nelva Bush, MD;  Location: [facility];  Service: Cardiovascular;  Laterality: N/A;   CORONARY BALLOON ANGIOPLASTY N/A 01/15/2017   Procedure: CORONARY BALLOON ANGIOPLASTY;  Surgeon: Nelva Bush, MD;  Location: [facility];  Service: Cardiovascular;  Laterality: N/A;   CORONARY STENT INTERVENTION N/A  01/15/2017   Procedure: CORONARY STENT INTERVENTION;  Surgeon: Nelva Bush, MD;  Location: [facility];  Service: Cardiovascular;  Laterality: N/A;   ESOPHAGOGASTRODUODENOSCOPY (EGD) WITH PROPOFOL N/A 09/07/2014   Procedure: ESOPHAGOGASTRODUODENOSCOPY (EGD) WITH PROPOFOL;  Surgeon: Juanita Craver, MD;  [facility];  Service: Endoscopy;  Laterality: N/A;   EXTERNAL EAR SURGERY Bilateral 1970s   tumors removed   EYE SURGERY Left 2019   cataract extraction    EYE SURGERY Right 02/09/2018   eyelid surgery    INTRAVASCULAR PRESSURE WIRE/FFR STUDY N/A 12/12/2016   Procedure: INTRAVASCULAR PRESSURE WIRE/FFR STUDY;  Surgeon: Nelva Bush, MD;  Location: [facility];  Service: Cardiovascular;  Laterality: N/A;   MINI SHUNT INSERTION Right 07/20/2013   Procedure: INSERTION OF GLAUCOMA FILTRATION DEVICE RIGHT EYE;  Surgeon: Marylynn Pearson, MD;  Location: [facility];  Service: Ophthalmology;  Laterality: Right;   MITOMYCIN C APPLICATION Right 4/00/8676   Procedure: MITOMYCIN C APPLICATION;  Surgeon: Marylynn Pearson, MD;  Location: [facility];  Service: Ophthalmology;  Laterality: Right;   MITOMYCIN C APPLICATION Right 1/95/0932   Procedure: MITOMYCIN C APPLICATION RIGHT EYE;  Surgeon: Marylynn Pearson, MD;  Location: [facility];  Service: Ophthalmology;  Laterality: Right;   PLACEMENT OF BREAST IMPLANTS Bilateral 1992   "took all my breast tissue out; put implants in;fibrocystic breast disease "   POLYPECTOMY  01/10/2020   Procedure: POLYPECTOMY;  Surgeon: Juanita Craver, MD;  [facility];  Service: Endoscopy;;   RIGHT/LEFT HEART CATH AND CORONARY ANGIOGRAPHY N/A 12/12/2016   Procedure: RIGHT/LEFT HEART CATH AND CORONARY ANGIOGRAPHY;  Surgeon: Nelva Bush, MD;  Location: [facility];  Service: Cardiovascular;  Laterality: N/A;   TONSILLECTOMY     TOTAL ABDOMINAL HYSTERECTOMY  1982   TRABECULECTOMY Right 02/21/2015   Procedure: TRABECULECTOMY WITH  [facility] ON THE RIGHT EYE;  Surgeon:  Marylynn Pearson, MD;  Location: [facility];  Service: Ophthalmology;  Laterality: Right;     Current Outpatient Medications  Medication Sig Dispense Refill   acetaminophen (TYLENOL) 500 MG tablet Take 1,000 mg by mouth 2 (two) times daily as needed for moderate pain or headache.     albuterol (PROVENTIL) (2.5 MG/3ML) 0.083% nebulizer solution Take 3 mLs (2.5 mg total) by nebulization daily as needed for wheezing or shortness of breath. 75 mL 12   albuterol (VENTOLIN HFA) 108 (90 Base) MCG/ACT inhaler Inhale 2 puffs into the lungs every 6 (six) hours as needed for wheezing or shortness of breath. 18 g 12   aspirin EC 81 MG tablet Take 81 mg by mouth daily. Swallow whole.     azelastine (ASTELIN) 0.1 % nasal spray Place 1 spray into both nostrils 2 (two) times daily. Use in each nostril as directed 30 mL 1   benzonatate (TESSALON) 200 MG capsule Take 1 capsule (200 mg total) by mouth 3 (three) times daily as needed for cough. 30 capsule 2   Blood Glucose Monitoring Suppl (ACCU-CHEK AVIVA PLUS) w/Device KIT Use to check blood sugars 3 times a day. Dx code e11.65 1 kit 3   brimonidine (ALPHAGAN P) 0.1 % SOLN Place 1 drop into both eyes 2 (two) times daily.      brinzolamide (AZOPT) 1 % ophthalmic  suspension Place 1 drop into both eyes 3 (three) times daily.     carvedilol (COREG) 12.5 MG tablet TAKE 1 TABLET(12.5 MG) BY MOUTH TWICE DAILY 60 tablet 0   clopidogrel (PLAVIX) 75 MG tablet Take 1 tablet (75 mg total) by mouth daily. Need appointment for further refills. [phone] 30 tablet 0   EPINEPHrine 0.3 mg/0.3 mL IJ SOAJ injection Inject 0.3 mg into the muscle as needed for anaphylaxis. 1 each 0   ezetimibe (ZETIA) 10 MG tablet Take 1 tablet (10 mg total) by mouth daily. 90 tablet 3   furosemide (LASIX) 40 MG tablet Take 1 tablet (40 mg total) by mouth every other day. 90 tablet 1   isosorbide mononitrate (IMDUR) 60 MG 24 hr tablet TAKE 1 TABLET(60 MG) BY MOUTH DAILY 90 tablet 2   ketorolac (ACULAR) 0.5  % ophthalmic solution Place 1 drop into the right eye 3 (three) times daily.     Lancets (ONETOUCH DELICA PLUS CHENID78E) MISC CHECK BLOOD SUGAR BEFORE BREAKFAST AND DINNER 100 each 1   montelukast (SINGULAIR) 10 MG tablet TAKE 1 TABLET BY MOUTH EVERY DAY 90 tablet 1   Nebulizers (COMPRESSOR/NEBULIZER) MISC Use as directed 1 each 0   pantoprazole (PROTONIX) 40 MG tablet Take 1 tablet (40 mg total) by mouth daily. 30 tablet 0   Polyvinyl Alcohol-Povidone PF (REFRESH) 1.4-0.6 % SOLN Place 1-2 drops into both eyes 3 (three) times daily as needed (dry/irritated eyes.).     potassium chloride SA (KLOR-CON) 20 MEQ tablet Take 1 tablet (20 mEq total) by mouth every other day. 90 tablet 1   prednisoLONE acetate (PRED FORTE) 1 % ophthalmic suspension Apply to eye.     pregabalin (LYRICA) 75 MG capsule TAKE 1 CAPSULE(75 MG) BY MOUTH TWICE DAILY 180 capsule 2   RHOPRESSA 0.02 % SOLN Place 1 drop into the left eye at bedtime.     umeclidinium-vilanterol (ANORO ELLIPTA) 62.5-25 MCG/ACT AEPB Inhale 1 puff into the lungs daily at 6 (six) AM. 1 each 12   nitroGLYCERIN (NITROSTAT) 0.4 MG SL tablet Place 1 tablet (0.4 mg total) under the tongue every 5 (five) minutes as needed for chest pain. 25 tablet prn   Travoprost, BAK Free, (TRAVATAN) 0.004 % SOLN ophthalmic solution Place 1 drop into the right eye at bedtime. (Patient not taking: Reported on 08/20/2021)     No current facility-administered medications for this visit.    Allergies:   Crestor [rosuvastatin calcium], Demerol  [meperidine hcl], and Shellfish allergy    Social History:  The patient  reports that she quit smoking about 41 years ago. Her smoking use included cigarettes. She has a 4.00 pack-year smoking history. She has never used smokeless tobacco. She reports that she does not drink alcohol and does not use drugs.   Family History:  The patient's family history includes Cancer in her mother and paternal grandmother; Diabetes in her brother and  father; Glaucoma in her brother and father; Heart disease in her father and paternal grandmother.    ROS: Noted in current history, otherwise review of systems is negative.   Physical Exam: Blood pressure 128/82, Sheila Reynolds 75, height 5' 2" (1.575 m), weight 167 lb 12.8 oz (76.1 kg), SpO2 98 %.  GEN:  Well nourished, well developed in no acute distress HEENT: Normal NECK: No JVD; No carotid bruits LYMPHATICS: No lymphadenopathy CARDIAC: RRR , no murmurs, rubs, gallops RESPIRATORY:  rales in the bases . ABDOMEN: Soft, non-tender, non-distended MUSCULOSKELETAL:  1-2 + pitting edema bilateral legs  No deformity  SKIN: Warm and dry NEUROLOGIC:  Alert and oriented x 3    EKG:      Recent Labs: 06/13/2021: ALT 18; BUN 12; Creatinine, Ser 0.83; Potassium 4.2; Sodium 139; TSH 0.892 08/19/2021: Hemoglobin 11.7; Platelets 187.0    Lipid Panel    Component Value Date/Time   CHOL 112 02/06/2021 1353   TRIG 65 02/06/2021 1353   HDL 41 02/06/2021 1353   CHOLHDL 2.7 02/06/2021 1353   CHOLHDL 3 05/24/2014 1650   VLDL 20.8 05/24/2014 1650   LDLCALC 57 02/06/2021 1353      Wt Readings from Last 3 Encounters:  08/20/21 167 lb 12.8 oz (76.1 kg)  08/19/21 168 lb 9.6 oz (76.5 kg)  06/13/21 170 lb 3.2 oz (77.2 kg)      Other studies Reviewed: Additional studies/ records that were reviewed today include: . Review of the above records demonstrates:    ASSESSMENT AND PLAN:   1.   Coronary artery disease:   having aytpical CP , seems to be related to reaching / stretching her arms,  does not sound cardiac at this point .    2. Hyperlipidemia -labs from Dec. 2022 look  good .    4. Nonsustained ventricular tachycardia-   no recurrent NSVT .    5.  Sarcoidosis -    6.  Leg edema :   may be related to her pulmonary fibrosis which was recently diangosed. ( sees Dr. Annamaria Boots)  recommend 3 days of double dose lasix and potassium  Info on the loung doctor leg rest.  Avoid salt .  Repeat  echo   7.  Pulmonary fibrosis:  new diagnosis.  Sees Dr. Annamaria Boots .     Current medicines are reviewed at length with the patient today.  The patient does not have concerns regarding medicines.  The following changes have been made:  no change  Labs/ tests ordered today include:   Orders Placed This Encounter  Procedures   ALT   Basic metabolic panel   Lipid panel   ECHOCARDIOGRAM COMPLETE    Will have her seen an APP in 6 months   Disposition: She appears to be very stable.  We will have her transfer to Dr. Johney Frame.  She will see see Dr. Johney Frame in 1 year.   Signed, Mertie Moores, MD  08/20/2021 5:42 PM    [facility] [address], [place], [place]  [address] Phone: [phone]; Fax: [phone]

## 2021-08-20 ENCOUNTER — Encounter: Payer: Self-pay | Admitting: Cardiovascular Disease

## 2021-08-20 ENCOUNTER — Ambulatory Visit (INDEPENDENT_AMBULATORY_CARE_PROVIDER_SITE_OTHER): Payer: Medicare Other | Admitting: Cardiovascular Disease

## 2021-08-20 VITALS — BP 128/82 | HR 73 | Ht 62.0 in | Wt 167.8 lb

## 2021-08-20 DIAGNOSIS — R600 Localized edema: Secondary | ICD-10-CM

## 2021-08-20 DIAGNOSIS — I25119 Atherosclerotic heart disease of native coronary artery with unspecified angina pectoris: Secondary | ICD-10-CM

## 2021-08-20 DIAGNOSIS — J8410 Pulmonary fibrosis, unspecified: Secondary | ICD-10-CM

## 2021-08-20 DIAGNOSIS — J84112 Idiopathic pulmonary fibrosis: Secondary | ICD-10-CM | POA: Insufficient documentation

## 2021-08-20 LAB — ANA COMPREHENSIVE PANEL
Anti JO-1: <0.2 AI (ref 0.0–0.9)
Centromere Ab Screen: <0.2 AI (ref 0.0–0.9)
Chromatin Ab SerPl-aCnc: <0.2 AI (ref 0.0–0.9)
ENA RNP Ab: <0.2 AI (ref 0.0–0.9)
ENA SM Ab Ser-aCnc: <0.2 AI (ref 0.0–0.9)
ENA SSA (RO) Ab: <0.2 AI (ref 0.0–0.9)
ENA SSB (LA) Ab: <0.2 AI (ref 0.0–0.9)
Scleroderma (Scl-70) (ENA) Antibody, IgG: <0.2 AI (ref 0.0–0.9)
dsDNA Ab: <1 IU/mL (ref 0–9)

## 2021-08-20 NOTE — Assessment & Plan Note (Signed)
I expect sarcoid to be burned-out. Plan-recheck inflammatory markers including ACE level

## 2021-08-20 NOTE — Patient Instructions (Addendum)
Medication Instructions:  Your physician recommends that you continue on your current medications as directed. Please refer to the Current Medication list given to you today.  *If you need a refill on your cardiac medications before your next appointment, please call your pharmacy*   Lab Work: LIPIDS, ALT, BMET Today If you have labs (blood work) drawn today and your tests are completely normal, you will receive your results only by: [place] (if you have MyChart) OR A paper copy in the mail If you have any lab test that is abnormal or we need to change your treatment, we will call you to review the results.   Testing/Procedures: ECHO Your physician has requested that you have an echocardiogram. Echocardiography is a painless test that uses sound waves to create images of your heart. It provides your doctor with information about the size and shape of your heart and how well your heart's chambers and valves are working. This procedure takes approximately one hour. There are no restrictions for this procedure.  Follow-Up: At [facility], you and your health needs are our priority.  As part of our continuing mission to provide you with exceptional heart care, we have created designated Provider Care Teams.  These Care Teams include your primary Cardiologist (physician) and Advanced Practice Providers (APPs -  Physician Assistants and Nurse Practitioners) who all work together to provide you with the care you need, when you need it.  Your next appointment:   6 month(s)  The format for your next appointment:   In Person  Provider:   [place] or Kathlen Mody {     Important Information About Sugar       For your  leg edema you  should do  the following 1. Leg elevation - I recommend the Lounge Dr. Leg rest.  See below for details  2. Salt restriction  -  Use potassium chloride instead of regular salt as a salt substitute. 3. Walk regularly 4. Compression hose - [employer]  store  5. Weight loss    Available on [email or web address] Or  Go to [email or web address]

## 2021-08-20 NOTE — Assessment & Plan Note (Signed)
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  I explained to her that the key is progression.  Current CT from 08/14/2021 looking stable. Plan-we will continue to follow with comparison chest CT.  If it progressive component is identified I told her I would have her seen by our ILD specialists.

## 2021-08-20 NOTE — Assessment & Plan Note (Addendum)
Very mild OSA.  We are going to reassess so we can reconsider treatment. Plan-update HST

## 2021-08-21 LAB — BASIC METABOLIC PANEL
BUN/Creatinine Ratio: 19 (ref 12–28)
BUN: 15 mg/dL (ref 8–27)
CO2: 24 mmol/L (ref 20–29)
Calcium: 9.2 mg/dL (ref 8.7–10.3)
Chloride: 102 mmol/L (ref 96–106)
Creatinine, Ser: 0.8 mg/dL (ref 0.57–1.00)
Glucose: 89 mg/dL (ref 70–99)
Potassium: 4.5 mmol/L (ref 3.5–5.2)
Sodium: 138 mmol/L (ref 134–144)
eGFR: 77 mL/min/{1.73_m2} (ref >59)

## 2021-08-21 LAB — LIPID PANEL
Chol/HDL Ratio: 4.1 ratio (ref 0.0–4.4)
Cholesterol, Total: 192 mg/dL (ref 100–199)
HDL: 47 mg/dL (ref >39)
LDL Chol Calc (NIH): 117 mg/dL — ABNORMAL HIGH (ref 0–99)
Triglycerides: 156 mg/dL — ABNORMAL HIGH (ref 0–149)
VLDL Cholesterol Cal: 28 mg/dL (ref 5–40)

## 2021-08-21 LAB — ANGIOTENSIN CONVERTING ENZYME: Angiotensin-Converting Enzyme: 32 U/L (ref 9–67)

## 2021-08-21 LAB — ALT: ALT: 13 IU/L (ref 0–32)

## 2021-08-23 ENCOUNTER — Telehealth: Payer: Self-pay

## 2021-08-23 MED ORDER — ATORVASTATIN CALCIUM 80 MG PO TABS: 80.0000 mg | ORAL_TABLET | Freq: Every day | ORAL | 3 refills | Status: DC

## 2021-08-23 MED ORDER — EZETIMIBE 10 MG PO TABS: 10.0000 mg | ORAL_TABLET | Freq: Every day | ORAL | 3 refills | Status: DC

## 2021-08-23 NOTE — Telephone Encounter (Signed)
Patient states she stopped taking the atorvastatin '80mg'$ . She thought the zetia replaced it, not being given in addition to it. Pt now understands. Refill sent in on Lipitor to pharmacy on file. States she sees her PCP [facility] in September and will request repeat labs to be done at that appt to re-check LDL. Asked that she have them sent to [place], states she will.  February 08, 2021 Ramond Dial, RPH-CPP   02/08/21 11:46 AM Note Spoke to patient. Reviewed labs and advised she continue atorvastatin '80mg'$  daily AND ezetimbie '10mg'$ . Pt appreciative of the call.

## 2021-08-23 NOTE — Telephone Encounter (Signed)
-----   Message from Thayer Headings, MD sent at 08/22/2021  7:49 PM EDT ----- LDL is 117 Her last LDL drawn 6 months ago was 57.   What is different?  Is she off the medications

## 2021-09-05 ENCOUNTER — Ambulatory Visit (HOSPITAL_COMMUNITY): Payer: Medicare Other | Attending: Cardiology

## 2021-09-05 DIAGNOSIS — R600 Localized edema: Secondary | ICD-10-CM | POA: Diagnosis not present

## 2021-09-05 DIAGNOSIS — I25119 Atherosclerotic heart disease of native coronary artery with unspecified angina pectoris: Secondary | ICD-10-CM | POA: Insufficient documentation

## 2021-09-05 DIAGNOSIS — J8410 Pulmonary fibrosis, unspecified: Secondary | ICD-10-CM | POA: Diagnosis not present

## 2021-09-05 LAB — ECHOCARDIOGRAM COMPLETE
Area-P 1/2: 3.48 cm2
S' Lateral: 2.2 cm

## 2021-09-05 MED ORDER — PERFLUTREN LIPID MICROSPHERE
1.0000 mL | INTRAVENOUS | Status: AC | PRN
  Administered 2021-09-05: 1 mL via INTRAVENOUS

## 2021-09-06 ENCOUNTER — Other Ambulatory Visit: Payer: Self-pay

## 2021-09-06 ENCOUNTER — Other Ambulatory Visit: Payer: Self-pay | Admitting: Cardiovascular Disease

## 2021-09-06 MED ORDER — ISOSORBIDE MONONITRATE ER 60 MG PO TB24: ORAL_TABLET | ORAL | 3 refills | Status: DC

## 2021-09-09 ENCOUNTER — Ambulatory Visit: Payer: Medicare Other | Admitting: Internal Medicine

## 2021-09-11 DIAGNOSIS — H2013 Chronic iridocyclitis, bilateral: Secondary | ICD-10-CM | POA: Diagnosis not present

## 2021-09-11 DIAGNOSIS — H3581 Retinal edema: Secondary | ICD-10-CM | POA: Diagnosis not present

## 2021-09-11 DIAGNOSIS — H35373 Puckering of macula, bilateral: Secondary | ICD-10-CM | POA: Diagnosis not present

## 2021-09-18 ENCOUNTER — Encounter: Payer: Self-pay | Admitting: Adult Health

## 2021-09-18 ENCOUNTER — Ambulatory Visit (INDEPENDENT_AMBULATORY_CARE_PROVIDER_SITE_OTHER): Payer: Medicare Other | Admitting: Adult Health

## 2021-09-18 VITALS — BP 146/85 | HR 79 | Ht 62.0 in | Wt 165.5 lb

## 2021-09-18 DIAGNOSIS — F321 Major depressive disorder, single episode, moderate: Secondary | ICD-10-CM

## 2021-09-18 DIAGNOSIS — R2689 Other abnormalities of gait and mobility: Secondary | ICD-10-CM

## 2021-09-18 DIAGNOSIS — G44209 Tension-type headache, unspecified, not intractable: Secondary | ICD-10-CM | POA: Diagnosis not present

## 2021-09-18 DIAGNOSIS — R4189 Other symptoms and signs involving cognitive functions and awareness: Secondary | ICD-10-CM

## 2021-09-18 NOTE — Patient Instructions (Addendum)
Your Plan:  Will place referral to see a counselor for underlying depression which is likely greatly contributing to your issues  Very important to treat your sleep apnea as untreated sleep can contribute to memory issues   Referral placed to physical therapy - if you do not hear from them by next week, please call them      Thank you for coming to see [place] at [facility]. I hope we have been able to provide you high quality care today.  You may receive a patient satisfaction survey over the next few weeks. We would appreciate your feedback and comments so that we may continue to improve ourselves and the health of our patients.

## 2021-09-18 NOTE — Progress Notes (Signed)
[facility] [address] [place]. [place] [address] [phone]       OFFICE FOLLOW UP NOTE  Sheila Reynolds Date of Birth:  [birth year] Medical Record Number:  [id number]   Referring MD:  Glendale Chard  Reason for visit: Memory loss  Chief Complaint  Patient presents with   Follow-up    Pt is well. Sheila Reynolds states headaches have been okay. Sheila Reynolds is having memory issues and brain fog. Sheila Reynolds is experiencing forgetfullness. Room 2 alone     HPI:   Update 09/18/2021 JM: Patient scheduled today's visit after prior visit with Dr. Leonie Man 10 months ago for concern of memory loss and brain fog.   Feels like having a harder time "registering" what is being said to her during conversation. Sheila Reynolds has little motivation to do activity or do prior things Sheila Reynolds was interested in such as going to see a movie or going to a football game. Sheila Reynolds still manages/rents houses, has had a hard time getting things done such as repairs either due to lack of motivation or will forget Sheila Reynolds was supposed to do certain tasks.  Sheila Reynolds does admit to poor sleep, wakes up frequently throughout the night, at times only gets 3-4 hrs.  Admits to intermittent use of CPAP as Sheila Reynolds has a difficulty tolerating.  Plans on repeating HST to see if apnea still present as initial AHI low.  Sheila Reynolds does admit to increased stressors at home with family as well as likely underlying depression.  Geriatric depression score 9.  Sheila Reynolds voices frustration regarding her overall health and multiple health issues.  Does report her father had dementia which did not develop towards end-of-life while in nursing home.  No other family history of dementia.  Sheila Reynolds is greatly concerned/worried that Sheila Reynolds has developed dementia.  MMSE today 30/30 (prior 27/30 03/2020) Extensive evaluation back in 03/2020 for complaints of memory loss largely unremarkable PCP completed B12 and TSH which were normal.   Reports headaches have been off and on. Usually has 1 headache  per week but not always severe. Sheila Reynolds believes majority are coming from her poor eyesight.  Sheila Reynolds is being closely followed by ophthalmology.  No change in headache frequency or characteristics since prior visit. Sheila Reynolds stopped taking topamax since prior visit as Sheila Reynolds did not feel it was helping her headaches. Sheila Reynolds was seen in ED 1/17 with dizziness and headache with workup largely unremarkable and headache improved after migraine cocktail.  Towards the end of visit, Sheila Reynolds also mentions continued poor balance which is not a new issue and denies any worsening.  Sheila Reynolds questions trying physical therapy for possible benefit.  No other concerns at this time      History provided for reference purposes only Update 11/15/2020 Dr. Leonie Man Sheila Reynolds returns for follow-up after last visit with Janett Billow nurse practitioner on 09/04/2020.  Sheila Reynolds states headaches are slightly better but still occur 2-3 times per week particularly at night Sheila Reynolds often has to wake up.  Sheila Reynolds has to take 2 tablets of Tylenol which seems to help and Sheila Reynolds is able to go back to sleep.  Sheila Reynolds also has to put prednisone drops into her eyes which also helped.  Sheila Reynolds did try Topamax and did all right for a while Sheila Reynolds was taking 1 tablet a day and it seemed to help but when Sheila Reynolds increase it to 2 tablets a day it made her quite sleepy and hence Sheila Reynolds stopped it after 2 weeks.  Sheila Reynolds does complain of some posterior neck  muscle tightness and pain which Sheila Reynolds has not been doing any neck stretching exercises.  Sheila Reynolds continues to have mild gait and balance difficulties but these are longstanding and unchanged.  Sheila Reynolds has no new complaints.   Update 09/03/2020 JM: Sheila Reynolds returns for sooner scheduled visit for headaches. Sheila Reynolds reports being seen by her ophthalmologist who advised her to follow-up with our office for further evaluation.  Extensive work-up completed after prior visit in 04/2020 including MR brain, MR cervical, EEG and lab work which was all unremarkable.  Reports over the past 3  months, Sheila Reynolds has been experiencing left orbital and periorbital headaches.  Initially experiencing them daily but have since been gradually improving.  Sheila Reynolds may experience dull headache daily but at times can be severe throbbing headache accompanied by blurred vision but denies actual loss of vision.  Sheila Reynolds was evaluated by her ophthalmologist with history of open-angle glaucoma bilaterally with recent exam normal IOP bilaterally.  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  Sheila Reynolds also intermittently use Tylenol with benefit.  Denies photophobia, phonophobia or N/V.  Sheila Reynolds was seen by her PCP who did lab work on 6/27 with a normal CRP and sed rate.  Update 04/03/2020 Dr. Leonie Man: Sheila Reynolds returns for follow-up after last visit year and half ago.  Sheila Reynolds has new complaints of worsening balance, dizziness, memory as well as headaches and neck pain.  Sheila Reynolds states her balance is poor and Sheila Reynolds has had a few falls but no injuries.  Sheila Reynolds has to be careful and often can catch her self and does not fall.  Sheila Reynolds denies any significant neck pain radiating down her spine but does complain of tightness in the neck and trouble moving her neck.  Sheila Reynolds has had no recent neck x-rays or scans done.  Sheila Reynolds also complains of posterior headaches as well as bifrontal headaches which are intermittent and last only few minutes.  Sheila Reynolds takes some Tylenol which seems to help.  Cold compression also seems to help her headaches.  Sheila Reynolds did undergo some carotid ultrasound at last visit after Sheila Reynolds saw me on 10/29/2018 which showed no significant extracranial stenosis and transcranial Doppler study was also done which showed poor left improvement of but normal velocities in the vessels that could be studied.  On inquiry Sheila Reynolds admits to numbness in the right hand which is intermittent but more in the night.  Sheila Reynolds does agree that the numbness seems to more prominent when Sheila Reynolds performs activities with rapid  repetitive wrist flexion.  Initial visit 10/20/2018 Dr. Leonie Man: Sheila Reynolds is a 76 year old African-American lady seen today for initial office consultation visit for stroke.  Sheila Reynolds is accompanied by her husband today.  History is obtained from them and review of referral notes and have personally reviewed imaging films in PACS.  The patient had been having steady decline in the vision acuity in the right eye and for which Sheila Reynolds was seen by neuro-ophthalmology recently.  They ordered an MRI of the brain and orbits which was done on 10/05/2018 which I personally reviewed MRI of the orbits was unremarkable but MRI scan of the brain showed a small left thalamic acute infarct.  Patient had no neurological symptoms at that time and this is likely an incidental finding.  Patient denied any symptoms of right-sided numbness, tingling, weakness gait or balance problems or memory problems.  Sheila Reynolds thought a few days prior to her MRI Sheila Reynolds had some twitchings in the  left side of the face but this lasted only a few hours and recovered.  Sheila Reynolds denied any increased watering of the eyes lack of sensation or weakness.  Patient has no prior history of strokes, TIAs, seizures, migraines or significant neurological problems.  Sheila Reynolds had lab work on 10/07/2018 which showed LDL cholesterol of 71 mg percent.  Sheila Reynolds takes Lipitor 40 mg daily.  Sheila Reynolds had a hemoglobin A1c of 6.1 on 07/20/2018.  Her blood pressure is usually well controlled and today it is 134/77.  Her fasting sugars have all been good in the 110 range.  Sheila Reynolds also has sleep apnea and is quite compliant with her CPAP and takes it every night.  Sheila Reynolds has not had any vascular imaging studies done for the brain of the neck.  Sheila Reynolds has no neurological complaints today.  ROS:   14 system review of systems is positive for those listed in HPI and all other systems negative e and all other systems negative  PMH:  Past Medical History:  Diagnosis Date   Anemia    3 months ago anemic   Anxiety    on  meds   Arthritis    "all over" (01/15/2017)   Asthma    Bronchitis with emphysema    Chest pain    Chronic bronchitis ([place])    Coronary artery disease    a. 01/2017 Sheila Reynolds underwent orbital atherectomy/DES to the proxmal LAD and PTCA to ostial D2. 2D Echo 01/15/17 showed mild LVH, EF 60-65%, grade 1 DD.   Family history of anesthesia complication    daughter N/V   Fibromyalgia    GERD (gastroesophageal reflux disease)    on meds   Glaucoma    Heart murmur    History of hiatal hernia    Hx of echocardiogram    Echo (03/2013):  Tech limited; Mild focal basal septal hypertrophy, EF 60-65%, normal RVF   Hyperlipidemia    Hypertension    Nonsustained ventricular tachycardia (HCC)    OSA on CPAP    Premature atrial contractions    PVC (premature ventricular contraction)    a. Holter 12/16: NSR, occ PAC,PVCs   Sarcoidosis    Type II diabetes mellitus ([place])     Social History:  Social History   Socioeconomic History   Marital status: Married    Spouse name: Gerald Stabs   Number of children: 2   Years of education: Not on file   Highest education level: Not on file  Occupational History   Occupation: unemployed    Employer: [employer]  Tobacco Use   Smoking status: Former    Packs/day: 1.00    Years: 4.00    Total pack years: 4.00    Types: Cigarettes    Quit date: 02/11/1980    Years since quitting: 41.6   Smokeless tobacco: Never  Vaping Use   Vaping Use: Never used  Substance and Sexual Activity   Alcohol use: No   Drug use: No   Sexual activity: Yes  Other Topics Concern   Not on file  Social History Narrative   Lives with husband   Right hand   Drinks 2-3 cups caffeine daily   Social Determinants of Health   Financial Resource Strain: Low Risk  (04/04/2021)   Overall Financial Resource Strain (CARDIA)    Difficulty of Paying Living Expenses: Not hard at all  Food Insecurity: No Food Insecurity (04/04/2021)   Hunger Vital Sign    Worried About  Running Out of Food  in the Last Year: Never true    [place] in the Last Year: Never true  Transportation Needs: No Transportation Needs (04/04/2021)   PRAPARE - Hydrologist (Medical): No    Lack of Transportation (Non-Medical): No  Physical Activity: Inactive (04/04/2021)   Exercise Vital Sign    Days of Exercise per Week: 0 days    Minutes of Exercise per Session: 0 min  Stress: No Stress Concern Present (04/04/2021)   [place]    Feeling of Stress : Only a little  Social Connections: Not on file  Intimate Partner Violence: Not At Risk (03/18/2018)   Humiliation, Afraid, Rape, and Kick questionnaire    Fear of Current or Ex-Partner: No    Emotionally Abused: No    Physically Abused: No    Sexually Abused: No    Medications:   Current Outpatient Medications on File Prior to Visit  Medication Sig Dispense Refill   acetaminophen (TYLENOL) 500 MG tablet Take 1,000 mg by mouth 2 (two) times daily as needed for moderate pain or headache.     albuterol (PROVENTIL) (2.5 MG/3ML) 0.083% nebulizer solution Take 3 mLs (2.5 mg total) by nebulization daily as needed for wheezing or shortness of breath. 75 mL 12   albuterol (VENTOLIN HFA) 108 (90 Base) MCG/ACT inhaler Inhale 2 puffs into the lungs every 6 (six) hours as needed for wheezing or shortness of breath. 18 g 12   aspirin EC 81 MG tablet Take 81 mg by mouth daily. Swallow whole.     atorvastatin (LIPITOR) 80 MG tablet Take 1 tablet (80 mg total) by mouth daily. 90 tablet 3   azelastine (ASTELIN) 0.1 % nasal spray Place 1 spray into both nostrils 2 (two) times daily. Use in each nostril as directed 30 mL 1   benzonatate (TESSALON) 200 MG capsule Take 1 capsule (200 mg total) by mouth 3 (three) times daily as needed for cough. 30 capsule 2   Blood Glucose Monitoring Suppl (ACCU-CHEK AVIVA PLUS) w/Device KIT Use to check blood sugars 3 times a  day. Dx code e11.65 1 kit 3   brimonidine (ALPHAGAN P) 0.1 % SOLN Place 1 drop into both eyes 2 (two) times daily.      brinzolamide (AZOPT) 1 % ophthalmic suspension Place 1 drop into both eyes 3 (three) times daily.     carvedilol (COREG) 12.5 MG tablet TAKE 1 TABLET(12.5 MG) BY MOUTH TWICE DAILY 60 tablet 0   clopidogrel (PLAVIX) 75 MG tablet TAKE 1 TABLET(75 MG) BY MOUTH DAILY 90 tablet 3   EPINEPHrine 0.3 mg/0.3 mL IJ SOAJ injection Inject 0.3 mg into the muscle as needed for anaphylaxis. 1 each 0   ezetimibe (ZETIA) 10 MG tablet Take 1 tablet (10 mg total) by mouth daily. 90 tablet 3   furosemide (LASIX) 40 MG tablet Take 1 tablet (40 mg total) by mouth every other day. 90 tablet 1   isosorbide mononitrate (IMDUR) 60 MG 24 hr tablet TAKE 1 TABLET(60 MG) BY MOUTH DAILY 90 tablet 3   ketorolac (ACULAR) 0.5 % ophthalmic solution Place 1 drop into the right eye 3 (three) times daily.     Lancets (ONETOUCH DELICA PLUS [id number]) MISC CHECK BLOOD SUGAR BEFORE BREAKFAST AND DINNER 100 each 1   montelukast (SINGULAIR) 10 MG tablet TAKE 1 TABLET BY MOUTH EVERY DAY 90 tablet 1   Nebulizers (COMPRESSOR/NEBULIZER) MISC Use as directed 1 each 0  pantoprazole (PROTONIX) 40 MG tablet TAKE 1 TABLET(40 MG) BY MOUTH DAILY 90 tablet 3   Polyvinyl Alcohol-Povidone PF (REFRESH) 1.4-0.6 % SOLN Place 1-2 drops into both eyes 3 (three) times daily as needed (dry/irritated eyes.).     potassium chloride SA (KLOR-CON) 20 MEQ tablet Take 1 tablet (20 mEq total) by mouth every other day. 90 tablet 1   prednisoLONE acetate (PRED FORTE) 1 % ophthalmic suspension Apply to eye.     pregabalin (LYRICA) 75 MG capsule TAKE 1 CAPSULE(75 MG) BY MOUTH TWICE DAILY 180 capsule 2   RHOPRESSA 0.02 % SOLN Place 1 drop into the left eye at bedtime.     Travoprost, BAK Free, (TRAVATAN) 0.004 % SOLN ophthalmic solution Place 1 drop into the right eye at bedtime.     umeclidinium-vilanterol (ANORO ELLIPTA) 62.5-25 MCG/ACT AEPB Inhale 1  puff into the lungs daily at 6 (six) AM. 1 each 12   nitroGLYCERIN (NITROSTAT) 0.4 MG SL tablet Place 1 tablet (0.4 mg total) under the tongue every 5 (five) minutes as needed for chest pain. 25 tablet prn   No current facility-administered medications on file prior to visit.    Allergies:   Allergies  Allergen Reactions   Crestor [Rosuvastatin Calcium] Other (See Comments)    muscle aches   Demerol  [Meperidine Hcl]     Other reaction(s): Hallucinations   Shellfish Allergy Itching and Other (See Comments)    Crab, shrimp and lobster ---lips itch and tingle Was told not to eat again after having a allergy test. Lobster, crab and shrimp     Physical Exam Today's Vitals   09/18/21 0743  BP: (!) 146/85  Pulse: 79  Weight: 165 lb 8 oz (75.1 kg)  Height: 5' 2" (1.575 m)   Body mass index is 30.27 kg/m.    General: well developed, well nourished elderly African-American lady, seated, in no evident distress Head: head normocephalic and atraumatic.   Neck: supple with no carotid or supraclavicular bruits Cardiovascular: regular rate and rhythm, no murmurs Musculoskeletal: no deformity Skin:  no rash/petichiae Vascular:  Normal pulses all extremities  Neurologic Exam Mental Status: Awake and fully alert.  Fluent speech and language.  Oriented to place and time. Recent memory subjectively impaired and remote memory intact. Attention span, concentration and fund of knowledge appropriate during visit.  Provided extensive history and lots of information during visit without difficulty.  Mood and affect flat    09/18/2021    7:46 AM 04/03/2020    1:22 PM  MMSE - Mini Mental State Exam  Orientation to time 5 5  Orientation to Place 5 5  Registration 3 3  Attention/ Calculation 5 3  Recall 3 2  Language- name 2 objects 2 2  Language- repeat 1 1  Language- follow 3 step command 3 3  Language- read & follow direction 1 1  Write a sentence 1 1  Copy design 1 1  Total score 30 27    Cranial Nerves: Pupils equal, briskly reactive to light. Extraocular movements full without nystagmus. Visual fields full to confrontation.  Diminished vision acuity bilaterally (chronic).  Hearing slightly diminished bilaterally. Facial sensation intact. Face, tongue, palate moves normally and symmetrically.  Motor: Normal bulk and tone. Normal strength in all tested extremity muscles. Sensory.: intact to touch , pinprick , position and vibratory sensation.   Coordination: Rapid alternating movements normal in all extremities. Finger-to-nose and heel-to-shin performed accurately bilaterally. Gait and Station: Arises from chair without difficulty. Stance is normal.  Gait demonstrates normal stride length and mild imbalance without use of assistive device. Able to heel, toe and tandem walk with slight difficulty.  Reflexes: 1+ and symmetric. Toes downgoing.       IMAGING/LABS  MR brain w wo 04/2020 -chronic left thalamic lacunar infarct -negative for acute findings MR cervical 04/2020 - stable, mild disc bulging at C3-4 down to C6-7 EEG 04/2020  unremarkable Lab work 04/2020 WNL (sed rate, dementia panel, lipid panel and A1c)      ASSESSMENT/PLAN: 76 year old African-American lady with asymptomatic left subcortical lacunar infarct in August 2020 discovered at the time of brain imaging for vision difficulties which is unrelated and likely from glaucoma and eye problem.  Vascular risk factors of hypertension, hyperlipidemia, diabetes and sleep apnea with CPAP noncompliance.  At 03/2020 visit, new complaints of worsening balance, right hand numbness, memory loss, headaches -extensive evaluation unremarkable but continued to experience frontal and left orbital headaches.  Returns today 8/9 with memory concerns but highly suspect in setting of underlying stress and depression with geriatric depression scale 9 and MMSE 30/30, also notes continued occasional headaches and chronic imbalance.      -Long discussion regarding underlying depression, home stressors and noncompliance with CPAP likely contributing to memory concerns -Referral placed to psychology.  Denies interest in medication management. Will defer further monitoring/management of depression to PCP -Discussed importance of stress reduction techniques as well as routine memory exercises and increase daily activities/functioning -Recent B12 and TSH normal -No indication for repeat imaging or further workup at this time as no focal deficits on exam or red flag/warning signs/symptoms.   -Plans on repeating HST with pulmonology to evaluate for continued apnea and if so, plans on looking into alternative treatment options -Continue to follow with ophthalmology for visual concerns likely contributing to headaches -Referral placed to PT for chronic imbalance and possible compensation strategies    Follow-up as needed at this time, no further recommendations or workup indicated at this present time   CC:  Glendale Chard, MD   I spent a prolonged 52 minutes of face-to-face and non-face-to-face time with patient.  This included previsit chart review, lab review, study review, order entry, electronic health record documentation, patient education and discussion regarding memory complaints and likely underlying contributing factors, continued headaches and imbalance and answered all other questions to patient's satisfaction  Frann Rider, [facility]  [facility] [address] [place], [place] [address]  Phone [phone] Fax [phone] Note: This document was prepared with digital dictation and possible smart phrase technology. Any transcriptional errors that result from this process are unintentional.

## 2021-09-20 ENCOUNTER — Encounter: Payer: Self-pay | Admitting: Physical Therapy

## 2021-09-20 ENCOUNTER — Ambulatory Visit: Payer: Medicare Other | Attending: Adult Health | Admitting: Physical Therapy

## 2021-09-20 VITALS — BP 120/59 | HR 81

## 2021-09-20 DIAGNOSIS — R2681 Unsteadiness on feet: Secondary | ICD-10-CM | POA: Insufficient documentation

## 2021-09-20 DIAGNOSIS — I890 Lymphedema, not elsewhere classified: Secondary | ICD-10-CM | POA: Insufficient documentation

## 2021-09-20 DIAGNOSIS — R2689 Other abnormalities of gait and mobility: Secondary | ICD-10-CM | POA: Diagnosis not present

## 2021-09-20 NOTE — Therapy (Unsigned)
OUTPATIENT PHYSICAL THERAPY NEURO EVALUATION   Patient Name: Sheila Reynolds MRN: [id number] DOB:[birth year], 76 y.o., female Today's Date: 09/20/2021   PCP: Glendale Chard, MD REFERRING PROVIDER: Frann Rider, NP     09/20/21 1123  PT Visits / Re-Eval  Visit Number 1  Number of Visits 9 (8+eval)  Date for PT Re-Evaluation 11/29/21  Authorization  Authorization Type MEDICARE PART A AND B  PT Time Calculation  PT Start Time 1111 (pt late)  PT Stop Time 1156  PT Time Calculation (min) 45 min  PT - End of Session  Activity Tolerance Patient tolerated treatment well  Behavior During Therapy WFL for tasks assessed/performed    Past Medical History:  Diagnosis Date   Anemia    3 months ago anemic   Anxiety    on meds   Arthritis    "all over" (01/15/2017)   Asthma    Bronchitis with emphysema    Chest pain    Chronic bronchitis ([place])    Coronary artery disease    a. 01/2017 she underwent orbital atherectomy/DES to the proxmal LAD and PTCA to ostial D2. 2D Echo 01/15/17 showed mild LVH, EF 60-65%, grade 1 DD.   Family history of anesthesia complication    daughter N/V   Fibromyalgia    GERD (gastroesophageal reflux disease)    on meds   Glaucoma    Heart murmur    History of hiatal hernia    Hx of echocardiogram    Echo (03/2013):  Tech limited; Mild focal basal septal hypertrophy, EF 60-65%, normal RVF   Hyperlipidemia    Hypertension    Nonsustained ventricular tachycardia (HCC)    OSA on CPAP    Premature atrial contractions    PVC (premature ventricular contraction)    a. Holter 12/16: NSR, occ PAC,PVCs   Sarcoidosis    Type II diabetes mellitus ([place])    Past Surgical History:  Procedure Laterality Date   BREAST SURGERY     CARDIAC CATHETERIZATION  05/04/2007   reveals overall normal left ventricular systolic function. Ejection fraction 65-70%   CATARACT EXTRACTION W/PHACO Right 07/20/2013   Procedure: CATARACT EXTRACTION PHACO AND INTRAOCULAR LENS  PLACEMENT (IOC);  Surgeon: Marylynn Pearson, MD;  Location: [facility];  Service: Ophthalmology;  Laterality: Right;   COLONOSCOPY W/ BIOPSIES AND POLYPECTOMY     COLONOSCOPY WITH PROPOFOL N/A 09/07/2014   Procedure: COLONOSCOPY WITH PROPOFOL;  Surgeon: Juanita Craver, MD;  Location: [facility];  Service: Endoscopy;  Laterality: N/A;   COLONOSCOPY WITH PROPOFOL N/A 01/10/2020   Procedure: COLONOSCOPY WITH PROPOFOL;  Surgeon: Juanita Craver, MD;  Location: [facility];  Service: Endoscopy;  Laterality: N/A;   CORONARY ANGIOPLASTY WITH STENT PLACEMENT  01/15/2017   CORONARY ATHERECTOMY N/A 01/15/2017   Procedure: CORONARY ATHERECTOMY;  Surgeon: Nelva Bush, MD;  Location: [facility];  Service: Cardiovascular;  Laterality: N/A;   CORONARY BALLOON ANGIOPLASTY N/A 01/15/2017   Procedure: CORONARY BALLOON ANGIOPLASTY;  Surgeon: Nelva Bush, MD;  Location: [facility];  Service: Cardiovascular;  Laterality: N/A;   CORONARY STENT INTERVENTION N/A 01/15/2017   Procedure: CORONARY STENT INTERVENTION;  Surgeon: Nelva Bush, MD;  Location: [facility];  Service: Cardiovascular;  Laterality: N/A;   ESOPHAGOGASTRODUODENOSCOPY (EGD) WITH PROPOFOL N/A 09/07/2014   Procedure: ESOPHAGOGASTRODUODENOSCOPY (EGD) WITH PROPOFOL;  Surgeon: Juanita Craver, MD;  [facility]: [facility];  Service: Endoscopy;  Laterality: N/A;   EXTERNAL EAR SURGERY Bilateral 1970s   tumors removed   EYE SURGERY Left 2019  cataract extraction    EYE SURGERY Right 02/09/2018   eyelid surgery    INTRAVASCULAR PRESSURE WIRE/FFR STUDY N/A 12/12/2016   Procedure: INTRAVASCULAR PRESSURE WIRE/FFR STUDY;  Surgeon: Nelva Bush, MD;  Location: [facility];  Service: Cardiovascular;  Laterality: N/A;   MINI SHUNT INSERTION Right 07/20/2013   Procedure: INSERTION OF GLAUCOMA FILTRATION DEVICE RIGHT EYE;  Surgeon: Marylynn Pearson, MD;  Location: [facility];  Service: Ophthalmology;  Laterality: Right;   MITOMYCIN C APPLICATION  Right 5/80/9983   Procedure: MITOMYCIN C APPLICATION;  Surgeon: Marylynn Pearson, MD;  Location: [facility];  Service: Ophthalmology;  Laterality: Right;   MITOMYCIN C APPLICATION Right 3/82/5053   Procedure: MITOMYCIN C APPLICATION RIGHT EYE;  Surgeon: Marylynn Pearson, MD;  Location: [facility];  Service: Ophthalmology;  Laterality: Right;   PLACEMENT OF BREAST IMPLANTS Bilateral 1992   "took all my breast tissue out; put implants in;fibrocystic breast disease "   POLYPECTOMY  01/10/2020   Procedure: POLYPECTOMY;  Surgeon: Juanita Craver, MD;  [facility];  Service: Endoscopy;;   RIGHT/LEFT HEART CATH AND CORONARY ANGIOGRAPHY N/A 12/12/2016   Procedure: RIGHT/LEFT HEART CATH AND CORONARY ANGIOGRAPHY;  Surgeon: Nelva Bush, MD;  Location: [facility];  Service: Cardiovascular;  Laterality: N/A;   TONSILLECTOMY     TOTAL ABDOMINAL HYSTERECTOMY  1982   TRABECULECTOMY Right 02/21/2015   Procedure: TRABECULECTOMY WITH [facility] ON THE RIGHT EYE;  Surgeon: Marylynn Pearson, MD;  Location: [facility];  Service: Ophthalmology;  Laterality: Right;   Patient Active Problem List   Diagnosis Date Noted   IPF (idiopathic pulmonary fibrosis) ([place]) 08/20/2021   Urinary frequency 06/19/2021   Urinary tract infection without hematuria 06/19/2021   Imbalance 06/19/2021   Memory changes 06/19/2021   Drug therapy 06/19/2021   Back pain 05/24/2020   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 heart failure ([place]) 04/16/2020   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) ([place]) 04/13/2020   Chronic diastolic heart failure ([place]) 03/28/2020   Uncontrolled type 2 diabetes mellitus with hyperglycemia ([place]) 07/25/2019   Hiatal hernia 07/25/2019   Spontaneous ecchymoses 07/25/2019   Class 1 obesity due to excess calories with body mass index (BMI) of 31.0 to 31.9 in adult 07/25/2019   Peripheral vascular disease, unspecified ([place]) 03/23/2019   Uveitis 06/01/2017   Fibromyalgia  06/01/2017   DDD (degenerative disc disease), lumbar 06/01/2017   DDD (degenerative disc disease), thoracic 06/01/2017   History of hypercholesterolemia 06/01/2017   History of anxiety 06/01/2017   History of IBS 06/01/2017   Premature atrial contractions 03/30/2017   PVC's (premature ventricular contractions) 03/30/2017   Diabetes mellitus without complication ([place]) 97/67/3419   Angina pectoris ([place]) 01/15/2017   CAD (coronary artery disease) 01/12/2017   Shortness of breath 12/12/2016   Acute maxillary sinusitis 05/03/2014   Obstructive sleep apnea 11/04/2013   Multiple lung nodules 07/18/2012   Heart palpitations 03/11/2012   HTN (hypertension) 03/11/2012   Gastroesophageal reflux disease without esophagitis 07/13/2011   Sarcoidosis 02/08/2011   COPD with acute exacerbation ([place]) 02/05/2011   Hyperlipemia 09/24/2010   Atypical chest pain 09/24/2010   NSVT (nonsustained ventricular tachycardia) ([place]) 09/24/2010   Leg edema 09/24/2010    ONSET DATE: 09/18/2021   REFERRING DIAG: R26.89 (ICD-10-CM) - Imbalance   THERAPY DIAG:  Other abnormalities of gait and mobility  Unsteadiness on feet  Rationale for Evaluation and Treatment Rehabilitation  SUBJECTIVE:  SUBJECTIVE STATEMENT: Pt states she tends to fall forward and feels she walks leaning forward.  She occasionally feels dizzy, but does not correlate this to times she has fallen or felt significantly off balance. Pt accompanied by: self  PERTINENT HISTORY: Sarcoidosis, headaches, NSVT, HLD, leg edema, HTN, OSA, CAD, angina, DM2, PACs, PVCs, Fibromyalgia, PVD, chronic diastolic heart failure, recent diagnosis of pulmonary fibrosis  She has SOB at baseline likely related to pulmonary conditions.  She is seeing a pulmonary specialist next  Monday for ongoing issues.  PAIN:  Are you having pain? No  PRECAUTIONS: Fall  Pt wears right hearing aid-general hearing loss bilaterally. Pt is somewhat visually impaired and has glasses that she cannot locate currently.  WEIGHT BEARING RESTRICTIONS No  FALLS: Has patient fallen in last 6 months? Yes. Number of falls 3-1 fall the dog tripped her, another she was walking and lost her balance forward, the last fall she went up the steps inside and fell head first  LIVING ENVIRONMENT: Lives with: lives with their spouse Lives in: House/apartment Stairs: Yes: Internal: 8 & 8 (splint level) steps; on right going up and External: 7 steps; on left going up Has following equipment at home: Single point cane, Shower bench, and Grab bars  PLOF:  needs help with lifting from floor/low cabinet, needs help donning "support hose" and particular shoes like tennis shoes, otherwise independent  PATIENT GOALS "I want to be able to stand up and not stumble like I'm about to fall."  "I would like to be able to hold my grandson without my daughter being afraid I am going to fall."  OBJECTIVE:   DIAGNOSTIC FINDINGS: CT Chest 08/14/2021:  1. 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 [place], Iss 5, [phone], Oct 11 2016. 2. Aortic atherosclerosis (ICD10-I70.0). Coronary artery calcification. 3.  Emphysema (ICD10-J43.9).  COGNITION: Overall cognitive status: No family/caregiver present to determine baseline cognitive functioning and pt states memory changes, recent MMSE 30/30, she further elaborates that this is situational like when she is in crowds and busy environments   SENSATION: Light touch: WFL  COORDINATION: Assessment limited due to LE  edema.  EDEMA:  Bilateral LE edema at baseline, L worse than right Pt clarifies she does have graduated compression stockings, not just support hose.  MUSCLE TONE: none noted bilaterally  POSTURE: rounded shoulders and forward head  LOWER EXTREMITY ROM:     Active  Right Eval Left Eval  Hip flexion Functionally assessed WFL  Hip extension   Hip abduction   Hip adduction   Hip internal rotation   Hip external rotation   Knee flexion   Knee extension   Ankle dorsiflexion   Ankle plantarflexion   Ankle inversion   Ankle eversion    (Blank rows = not tested)  LOWER EXTREMITY MMT:    MMT Right Eval Left Eval  Hip flexion 3+/5 4-/5  Hip extension    Hip abduction 3/5 3/5  Hip adduction    Hip internal rotation    Hip external rotation    Knee flexion    Knee extension 3+/5 3+/5  Ankle dorsiflexion 3/5 3/5  Ankle plantarflexion    Ankle inversion    Ankle eversion    (Blank rows = not tested)  BED MOBILITY:  Sit to supine Complete Independence Supine to sit Complete  Independence  TRANSFERS: Assistive device utilized: None  Sit to stand: Complete Independence Stand to sit: Complete Independence Chair to chair: Complete Independence Floor: Max A and Total A  GAIT: Gait pattern: step through pattern, decreased stride length, trendelenburg, and narrow BOS Distance walked: various clinic distances Assistive device utilized: None Level of assistance: Complete Independence  FUNCTIONAL TESTs:  5 times sit to stand: 19.60 sec w/ intermittent UE support Timed up and go (TUG): 13.69 sec 2 minute walk test: To be assessed. Functional gait assessment: To be assessed. CogTUG: 24.66 sec  PATIENT SURVEYS:  None completed.  TODAY'S TREATMENT:  N/A   PATIENT EDUCATION: Education details: PT POC, assessments used, goals set. Person educated: Patient Education method: Explanation Education comprehension: verbalized understanding   HOME EXERCISE  PROGRAM: N/A    GOALS: Goals reviewed with patient? Yes  SHORT TERM GOALS: Target date: 10/18/2021  Pt will be independent with strength and balance HEP. Baseline:  To be established. Goal status: INITIAL  2.  Pt will decrease 5xSTS to </=16 seconds w/o UE support in order to demonstrate decreased risk for falls and improved functional bilateral LE strength and power. Baseline: 19.60 sec w/ intermittent UE support Goal status: INITIAL  3.  Pt will demonstrate CogTUG of </=20 seconds in order to decrease risk of falls and improve functional mobility using LRAD. Baseline: 24.66 sec no AD Goal status: INITIAL  4.  2MWT to be assessed with STG set as appropriate. Baseline: To be assessed. Goal status: INITIAL  5.  Pt will demonstrate teachback of fall prevention strategies and safety measures in the event of a fall in order to promote safety in home environment. Baseline:  Handout to be provided if needed. Goal status: INITIAL  LONG TERM GOALS: Target date: 11/15/2021  Pt will report compliance to walking program x3 days per week to promote aerobic gains and maintain LE strength gains outside clinic. Baseline: To be established. Goal status: INITIAL  2.  Pt will decrease 5xSTS to </=13 seconds w/o UE support in order to demonstrate decreased risk for falls and improved functional bilateral LE strength and power. Baseline: 19.60 sec w/ intermittent UE support Goal status: INITIAL  3.  Pt will demonstrate CogTUG of </=15 seconds in order to decrease risk of falls and improve functional mobility using LRAD. Baseline: 24.66 sec no AD Goal status: INITIAL  4.  Pt will demonstrate floor recovery using no more than minA to promote improved safety and independence in the event of a fall in her home environment. Baseline: To be reviewed. Goal status: INITIAL  5.  FGA to be assessed with LTG set as appropriate. Baseline: To be assessed. Goal status: INITIAL  ASSESSMENT:  CLINICAL  IMPRESSION: Patient is a 76 y.o. female who was seen today for physical therapy evaluation and treatment for imbalance.  Pt has a significant PMH of sarcoidosis, headaches, NSVT, HLD, leg edema, HTN, OSA, CAD, angina, DM2, PACs, PVCs, Fibromyalgia, PVD, and chronic diastolic heart failure.  Identified impairments include difficulty dual tasking, LE edema, fwd head posture, generalized LE weakness, repeated falls, and mild gait deviations.  Evaluation via the following assessment tools: 5xSTS, CogTUG, and history of repeated falls indicate fall risk.  They would benefit from skilled PT to address impairments as noted and progress towards long term goals.  OBJECTIVE IMPAIRMENTS Abnormal gait, cardiopulmonary status limiting activity, decreased activity tolerance, decreased balance, decreased strength, increased edema, and postural dysfunction.   ACTIVITY LIMITATIONS lifting, bending, squatting, transfers, and locomotion level  PARTICIPATION  LIMITATIONS: meal prep, laundry, and community activity  PERSONAL FACTORS Age, Fitness, Past/current experiences, Time since onset of injury/illness/exacerbation, and 3+ comorbidities: Sarcoidosis, pulmonary fibrosis, LE edema, chronic diastolic heart failure  are also affecting patient's functional outcome.   REHAB POTENTIAL: Good  CLINICAL DECISION MAKING: Evolving/moderate complexity  EVALUATION COMPLEXITY: Moderate  PLAN: PT FREQUENCY: 1x/week  PT DURATION: 8 weeks  PLANNED INTERVENTIONS: Therapeutic exercises, Therapeutic activity, Neuromuscular re-education, Balance training, Gait training, Patient/Family education, Self Care, Stair training, Vestibular training, DME instructions, and Re-evaluation  PLAN FOR NEXT SESSION: Assess 2MWT, FGA-update goals, initiate walking program and HEP for LE strength and balance, fall recovery/prevention strategies   Bary Richard, PT, DPT 09/20/2021, 11:56 AM

## 2021-09-23 ENCOUNTER — Telehealth: Payer: Self-pay | Admitting: Adult Health

## 2021-09-23 NOTE — Telephone Encounter (Signed)
Referral sent to [facility] [phone]. ?

## 2021-09-25 ENCOUNTER — Ambulatory Visit: Payer: Medicare Other | Admitting: Physical Therapy

## 2021-09-30 ENCOUNTER — Encounter: Payer: Self-pay | Admitting: Pulmonary Disease

## 2021-09-30 ENCOUNTER — Ambulatory Visit (INDEPENDENT_AMBULATORY_CARE_PROVIDER_SITE_OTHER): Payer: Medicare Other | Admitting: Pulmonary Disease

## 2021-09-30 VITALS — BP 140/82 | HR 71 | Temp 98.3°F | Ht 62.0 in | Wt 165.0 lb

## 2021-09-30 DIAGNOSIS — J849 Interstitial pulmonary disease, unspecified: Secondary | ICD-10-CM | POA: Diagnosis not present

## 2021-09-30 DIAGNOSIS — G4733 Obstructive sleep apnea (adult) (pediatric): Secondary | ICD-10-CM | POA: Diagnosis not present

## 2021-09-30 DIAGNOSIS — I25119 Atherosclerotic heart disease of native coronary artery with unspecified angina pectoris: Secondary | ICD-10-CM

## 2021-09-30 DIAGNOSIS — D869 Sarcoidosis, unspecified: Secondary | ICD-10-CM

## 2021-09-30 LAB — CK: Total CK: 149 U/L (ref 7–177)

## 2021-09-30 NOTE — Patient Instructions (Signed)
We will check some labs for further evaluation of the scarring in the lung Schedule pulmonary function testing follow-up in the next 1 to 2 months

## 2021-09-30 NOTE — Progress Notes (Addendum)
Sheila Reynolds    [id number]    1945/06/16  Primary Care Physician:Sanders, Bailey Mech, MD  Referring Physician: Glendale Chard, MD [address] [place],  [place] [address]  Chief complaint: Consult for interstitial lung disease  HPI: 76 y.o. who  has a past medical history of Anemia, Anxiety, Arthritis, Asthma, Bronchitis with emphysema, Chest pain, Chronic bronchitis ([place]), Coronary artery disease, Family history of anesthesia complication, Fibromyalgia, GERD (gastroesophageal reflux disease), Glaucoma, Heart murmur, History of hiatal hernia, echocardiogram, Hyperlipidemia, Hypertension, Nonsustained ventricular tachycardia ([place]), OSA on CPAP, Premature atrial contractions, PVC (premature ventricular contraction), Sarcoidosis, and Type II diabetes mellitus ([place]).   She is a patient of Dr. 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  She never had a biopsy done and was treated intermittently with prednisone.  She has been told of late that the sarcoidosis is in remission.  Her recent CT scans have demonstrated pulmonary fibrosis and she has been referred here for further evaluation  Complains of intermittent chest tightness and pain.  Has dyspnea on exertion.  Denies any cough, congestion, sputum production  Pets: Dogs Occupation: Retired Radio broadcast assistant Exposures: No recent mold exposure from a leak in 2022.  No ongoing exposures.  No mold, hot tub, Jacuzzi.  No feather pillows or comforters ILD questionnaire 09/30/2021-negative Smoking history: Quit smoking in 1983 Travel history: No significant travel history Relevant family history: No family history of lung disease   Outpatient Encounter Medications as of 09/30/2021  Medication Sig   acetaminophen (TYLENOL) 500 MG tablet Take 1,000 mg by mouth 2 (two) times daily as needed for moderate pain or headache.   albuterol (PROVENTIL) (2.5 MG/3ML) 0.083% nebulizer solution Take 3  mLs (2.5 mg total) by nebulization daily as needed for wheezing or shortness of breath.   albuterol (VENTOLIN HFA) 108 (90 Base) MCG/ACT inhaler Inhale 2 puffs into the lungs every 6 (six) hours as needed for wheezing or shortness of breath.   aspirin EC 81 MG tablet Take 81 mg by mouth daily. Swallow whole.   atorvastatin (LIPITOR) 80 MG tablet Take 1 tablet (80 mg total) by mouth daily.   azelastine (ASTELIN) 0.1 % nasal spray Place 1 spray into both nostrils 2 (two) times daily. Use in each nostril as directed   benzonatate (TESSALON) 200 MG capsule Take 1 capsule (200 mg total) by mouth 3 (three) times daily as needed for cough.   Blood Glucose Monitoring Suppl (ACCU-CHEK AVIVA PLUS) w/Device KIT Use to check blood sugars 3 times a day. Dx code e11.65   brimonidine (ALPHAGAN P) 0.1 % SOLN Place 1 drop into both eyes 2 (two) times daily.    brinzolamide (AZOPT) 1 % ophthalmic suspension Place 1 drop into both eyes 3 (three) times daily.   carvedilol (COREG) 12.5 MG tablet TAKE 1 TABLET(12.5 MG) BY MOUTH TWICE DAILY   clopidogrel (PLAVIX) 75 MG tablet TAKE 1 TABLET(75 MG) BY MOUTH DAILY   EPINEPHrine 0.3 mg/0.3 mL IJ SOAJ injection Inject 0.3 mg into the muscle as needed for anaphylaxis.   ezetimibe (ZETIA) 10 MG tablet Take 1 tablet (10 mg total) by mouth daily.   furosemide (LASIX) 40 MG tablet Take 1 tablet (40 mg total) by mouth every other day.   isosorbide mononitrate (IMDUR) 60 MG 24 hr tablet TAKE 1 TABLET(60 MG) BY MOUTH DAILY   ketorolac (ACULAR) 0.5 % ophthalmic solution Place 1 drop into the right eye 3 (three) times daily.  Lancets (ONETOUCH DELICA PLUS [id number]) MISC CHECK BLOOD SUGAR BEFORE BREAKFAST AND DINNER   montelukast (SINGULAIR) 10 MG tablet TAKE 1 TABLET BY MOUTH EVERY DAY   Nebulizers (COMPRESSOR/NEBULIZER) MISC Use as directed   pantoprazole (PROTONIX) 40 MG tablet TAKE 1 TABLET(40 MG) BY MOUTH DAILY   Polyvinyl Alcohol-Povidone PF (REFRESH) 1.4-0.6 % SOLN Place 1-2  drops into both eyes 3 (three) times daily as needed (dry/irritated eyes.).   potassium chloride SA (KLOR-CON) 20 MEQ tablet Take 1 tablet (20 mEq total) by mouth every other day.   prednisoLONE acetate (PRED FORTE) 1 % ophthalmic suspension Apply to eye.   pregabalin (LYRICA) 75 MG capsule TAKE 1 CAPSULE(75 MG) BY MOUTH TWICE DAILY   RHOPRESSA 0.02 % SOLN Place 1 drop into the left eye at bedtime.   Travoprost, BAK Free, (TRAVATAN) 0.004 % SOLN ophthalmic solution Place 1 drop into the right eye at bedtime.   umeclidinium-vilanterol (ANORO ELLIPTA) 62.5-25 MCG/ACT AEPB Inhale 1 puff into the lungs daily at 6 (six) AM.   nitroGLYCERIN (NITROSTAT) 0.4 MG SL tablet Place 1 tablet (0.4 mg total) under the tongue every 5 (five) minutes as needed for chest pain.   No facility-administered encounter medications on file as of 09/30/2021.    Allergies as of 09/30/2021 - Review Complete 09/30/2021  Allergen Reaction Noted   Crestor [rosuvastatin calcium] Other (See Comments) 09/23/2010   Demerol  [meperidine hcl]  10/25/2015   Shellfish allergy Itching and Other (See Comments) 08/19/2012    Past Medical History:  Diagnosis Date   Anemia    3 months ago anemic   Anxiety    on meds   Arthritis    "all over" (01/15/2017)   Asthma    Bronchitis with emphysema    Chest pain    Chronic bronchitis ([place])    Coronary artery disease    a. 01/2017 she underwent orbital atherectomy/DES to the proxmal LAD and PTCA to ostial D2. 2D Echo 01/15/17 showed mild LVH, EF 60-65%, grade 1 DD.   Family history of anesthesia complication    daughter N/V   Fibromyalgia    GERD (gastroesophageal reflux disease)    on meds   Glaucoma    Heart murmur    History of hiatal hernia    Hx of echocardiogram    Echo (03/2013):  Tech limited; Mild focal basal septal hypertrophy, EF 60-65%, normal RVF   Hyperlipidemia    Hypertension    Nonsustained ventricular tachycardia ([facility])    OSA on CPAP    Premature atrial  contractions    PVC (premature ventricular contraction)    a. Holter 12/16: NSR, occ PAC,PVCs   Sarcoidosis    Type II diabetes mellitus ([place])     Past Surgical History:  Procedure Laterality Date   BREAST SURGERY     CARDIAC CATHETERIZATION  05/04/2007   reveals overall normal left ventricular systolic function. Ejection fraction 65-70%   CATARACT EXTRACTION W/PHACO Right 07/20/2013   Procedure: CATARACT EXTRACTION PHACO AND INTRAOCULAR LENS PLACEMENT (IOC);  Surgeon: Marylynn Pearson, MD;  Location: [facility];  Service: Ophthalmology;  Laterality: Right;   COLONOSCOPY W/ BIOPSIES AND POLYPECTOMY     COLONOSCOPY WITH PROPOFOL N/A 09/07/2014   Procedure: COLONOSCOPY WITH PROPOFOL;  Surgeon: Juanita Craver, MD;  Location: [facility];  Service: Endoscopy;  Laterality: N/A;   COLONOSCOPY WITH PROPOFOL N/A 01/10/2020   Procedure: COLONOSCOPY WITH PROPOFOL;  Surgeon: Juanita Craver, MD;  Location: [facility];  Service: Endoscopy;  Laterality: N/A;   CORONARY  ANGIOPLASTY WITH STENT PLACEMENT  01/15/2017   CORONARY ATHERECTOMY N/A 01/15/2017   Procedure: CORONARY ATHERECTOMY;  Surgeon: Nelva Bush, MD;  Location: [facility];  Service: Cardiovascular;  Laterality: N/A;   CORONARY BALLOON ANGIOPLASTY N/A 01/15/2017   Procedure: CORONARY BALLOON ANGIOPLASTY;  Surgeon: Nelva Bush, MD;  Location: [facility];  Service: Cardiovascular;  Laterality: N/A;   CORONARY STENT INTERVENTION N/A 01/15/2017   Procedure: CORONARY STENT INTERVENTION;  Surgeon: Nelva Bush, MD;  Location: [facility];  Service: Cardiovascular;  Laterality: N/A;   ESOPHAGOGASTRODUODENOSCOPY (EGD) WITH PROPOFOL N/A 09/07/2014   Procedure: ESOPHAGOGASTRODUODENOSCOPY (EGD) WITH PROPOFOL;  Surgeon: Juanita Craver, MD;  Location: [facility];  Service: Endoscopy;  Laterality: N/A;   EXTERNAL EAR SURGERY Bilateral 1970s   tumors removed   EYE SURGERY Left 2019   cataract extraction    EYE SURGERY Right 02/09/2018    eyelid surgery    INTRAVASCULAR PRESSURE WIRE/FFR STUDY N/A 12/12/2016   Procedure: INTRAVASCULAR PRESSURE WIRE/FFR STUDY;  Surgeon: Nelva Bush, MD;  Location: [facility];  Service: Cardiovascular;  Laterality: N/A;   MINI SHUNT INSERTION Right 07/20/2013   Procedure: INSERTION OF GLAUCOMA FILTRATION DEVICE RIGHT EYE;  Surgeon: Marylynn Pearson, MD;  Location: [facility];  Service: Ophthalmology;  Laterality: Right;   MITOMYCIN C APPLICATION Right 6/65/9935   Procedure: MITOMYCIN C APPLICATION;  Surgeon: Marylynn Pearson, MD;  Location: [facility];  Service: Ophthalmology;  Laterality: Right;   MITOMYCIN C APPLICATION Right 08/11/7791   Procedure: MITOMYCIN C APPLICATION RIGHT EYE;  Surgeon: Marylynn Pearson, MD;  Location: [facility];  Service: Ophthalmology;  Laterality: Right;   PLACEMENT OF BREAST IMPLANTS Bilateral 1992   "took all my breast tissue out; put implants in;fibrocystic breast disease "   POLYPECTOMY  01/10/2020   Procedure: POLYPECTOMY;  Surgeon: Juanita Craver, MD;  Location: [facility];  Service: Endoscopy;;   RIGHT/LEFT HEART CATH AND CORONARY ANGIOGRAPHY N/A 12/12/2016   Procedure: RIGHT/LEFT HEART CATH AND CORONARY ANGIOGRAPHY;  Surgeon: Nelva Bush, MD;  Location: [facility];  Service: Cardiovascular;  Laterality: N/A;   TONSILLECTOMY     TOTAL ABDOMINAL HYSTERECTOMY  1982   TRABECULECTOMY Right 02/21/2015   Procedure: TRABECULECTOMY WITH [facility] ON THE RIGHT EYE;  Surgeon: Marylynn Pearson, MD;  Location: [facility];  Service: Ophthalmology;  Laterality: Right;    Family History  Problem Relation Age of Onset   Heart disease Father    Diabetes Father    Glaucoma Father    Cancer Mother        unknown type, ?lung   Heart disease Paternal Grandmother    Cancer Paternal Grandmother        unknown type   Diabetes Brother    Glaucoma Brother     Social History   Socioeconomic History   Marital status: Married    Spouse name: Gerald Stabs   Number of children: 2   Years of  education: Not on file   Highest education level: Not on file  Occupational History   Occupation: unemployed    Employer: [employer]  Tobacco Use   Smoking status: Former    Packs/day: 1.00    Years: 4.00    Total pack years: 4.00    Types: Cigarettes    Quit date: 02/11/1980    Years since quitting: 41.6   Smokeless tobacco: Never  Vaping Use   Vaping Use: Never used  Substance and Sexual Activity   Alcohol use: No   Drug  use: No   Sexual activity: Yes  Other Topics Concern   Not on file  Social History Narrative   Lives with husband   Right hand   Drinks 2-3 cups caffeine daily   Social Determinants of Health   Financial Resource Strain: Low Risk  (04/04/2021)   Overall Financial Resource Strain (CARDIA)    Difficulty of Paying Living Expenses: Not hard at all  Food Insecurity: No Food Insecurity (04/04/2021)   Hunger Vital Sign    Worried About Running Out of Food in the Last Year: Never true    Ran Out of Food in the Last Year: Never true  Transportation Needs: No Transportation Needs (04/04/2021)   PRAPARE - Hydrologist (Medical): No    Lack of Transportation (Non-Medical): No  Physical Activity: Inactive (04/04/2021)   Exercise Vital Sign    Days of Exercise per Week: 0 days    Minutes of Exercise per Session: 0 min  Stress: No Stress Concern Present (04/04/2021)   [place]    Feeling of Stress : Only a little  Social Connections: Not on file  Intimate Partner Violence: Not At Risk (03/18/2018)   Humiliation, Afraid, Rape, and Kick questionnaire    Fear of Current or Ex-Partner: No    Emotionally Abused: No    Physically Abused: No    Sexually Abused: No    Review of systems: Review of Systems  Constitutional: Negative for fever and chills.  HENT: Negative.   Eyes: Negative for blurred vision.  Respiratory: as per HPI  Cardiovascular: Negative  for chest pain and palpitations.  Gastrointestinal: Negative for vomiting, diarrhea, blood per rectum. Genitourinary: Negative for dysuria, urgency, frequency and hematuria.  Musculoskeletal: Negative for myalgias, back pain and joint pain.  Skin: Negative for itching and rash.  Neurological: Negative for dizziness, tremors, focal weakness, seizures and loss of consciousness.  Endo/Heme/Allergies: Negative for environmental allergies.  Psychiatric/Behavioral: Negative for depression, suicidal ideas and hallucinations.  All other systems reviewed and are negative.  Physical Exam: Blood pressure (!) 140/82, pulse 71, temperature 98.3 F (36.8 C), temperature source Oral, height _0  (1.575 m), weight 165 lb (74.8 kg), SpO2 100 %. Gen:      No acute distress HEENT:  EOMI, sclera anicteric Neck:     No masses; no thyromegaly Lungs:    Clear to auscultation bilaterally; normal respiratory effort CV:         Regular rate and rhythm; no murmurs Abd:      + bowel sounds; soft, non-tender; no palpable masses, no distension Ext:    No edema; adequate peripheral perfusion Skin:      Warm and dry; no rash Neuro: alert and oriented x 3 Psych: normal mood and affect  Data Reviewed: Imaging: High-resolution CT 08/14/2021- Mild bronchiectasis, peribronchovascular nodularity.  Stable basilar predominant fibrotic changes and probable UIP pattern.  I have reviewed the images personally  PFTs: 04/13/2020 FVC 1.59 [76%], FEV1 1.28 [79%], F/F 81, TLC 3.52 [73%], DLCO 9.27 [51%] Moderate diffusion defect.  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  She does have peribronchovascular nodularity in the upper lobe suggestive of sarcoidosis.  No connective tissue disease serologies or exposures noted.  Sarcoidosis can cause pulmonary fibrosis in a similar pattern.  Alternatively IPF can coexist  with sarcoidosis.  Her interstitial lung disease has  been stable on CT scans dating back several years.  We discussed further work-up and possible biopsy.  Given stability we will monitor for now Check pulmonary function test and baseline serologies for connective tissue disease.  Plan/Recommendations: Serologies for connective tissue disease, PFTs  Marshell Garfinkel MD [facility] 09/30/2021, 2:26 PM  CC: Glendale Chard, MD   Addendum: Received note from Dr. Kathlene November, rheumatology dated 11/19/2021 Patient is being evaluated for inflammatory arthritis which is possibly sarcoidosis related Starting azathioprine at rheumatology office.  I agreed with the plan and communicated this with Dr. Shelia Media MD [facility] See Amion for pager  If no response to pager , please call [phone] until 7pm After 7:00 pm call Elink  [phone] 11/19/2021, 3:16 PM

## 2021-10-01 DIAGNOSIS — H2512 Age-related nuclear cataract, left eye: Secondary | ICD-10-CM | POA: Diagnosis not present

## 2021-10-01 DIAGNOSIS — E119 Type 2 diabetes mellitus without complications: Secondary | ICD-10-CM | POA: Diagnosis not present

## 2021-10-02 ENCOUNTER — Ambulatory Visit: Payer: Medicare Other

## 2021-10-02 DIAGNOSIS — R2689 Other abnormalities of gait and mobility: Secondary | ICD-10-CM

## 2021-10-02 DIAGNOSIS — R2681 Unsteadiness on feet: Secondary | ICD-10-CM | POA: Diagnosis not present

## 2021-10-02 DIAGNOSIS — I890 Lymphedema, not elsewhere classified: Secondary | ICD-10-CM | POA: Diagnosis not present

## 2021-10-02 LAB — ALDOLASE: Aldolase: 3.7 U/L (ref <=8.1)

## 2021-10-02 NOTE — Therapy (Signed)
OUTPATIENT PHYSICAL THERAPY TREATMENT NOTE   Patient Name: Sheila Reynolds MRN: [id number] DOB:11/12/1945, 76 y.o., female Today's Date: 10/02/2021  PCP: Glendale Chard, MD REFERRING PROVIDER: Frann Rider, NP   END OF SESSION:   PT End of Session - 10/02/21 1022     Visit Number 2    Number of Visits 9    Date for PT Re-Evaluation 11/29/21    Authorization Type MEDICARE PART A AND B    PT Start Time 1020   patient late   PT Stop Time 1106    PT Time Calculation (min) 46 min    Equipment Utilized During Treatment Gait belt    Activity Tolerance Patient tolerated treatment well    Behavior During Therapy WFL for tasks assessed/performed             Past Medical History:  Diagnosis Date   Anemia    3 months ago anemic   Anxiety    on meds   Arthritis    "all over" (01/15/2017)   Asthma    Bronchitis with emphysema    Chest pain    Chronic bronchitis ([place])    Coronary artery disease    a. 01/2017 she underwent orbital atherectomy/DES to the proxmal LAD and PTCA to ostial D2. 2D Echo 01/15/17 showed mild LVH, EF 60-65%, grade 1 DD.   Family history of anesthesia complication    daughter N/V   Fibromyalgia    GERD (gastroesophageal reflux disease)    on meds   Glaucoma    Heart murmur    History of hiatal hernia    Hx of echocardiogram    Echo (03/2013):  Tech limited; Mild focal basal septal hypertrophy, EF 60-65%, normal RVF   Hyperlipidemia    Hypertension    Nonsustained ventricular tachycardia (HCC)    OSA on CPAP    Premature atrial contractions    PVC (premature ventricular contraction)    a. Holter 12/16: NSR, occ PAC,PVCs   Sarcoidosis    Type II diabetes mellitus ([place])    Past Surgical History:  Procedure Laterality Date   BREAST SURGERY     CARDIAC CATHETERIZATION  05/04/2007   reveals overall normal left ventricular systolic function. Ejection fraction 65-70%   CATARACT EXTRACTION W/PHACO Right 07/20/2013   Procedure: CATARACT EXTRACTION  PHACO AND INTRAOCULAR LENS PLACEMENT (IOC);  Surgeon: Marylynn Pearson, MD;  Location: [facility];  Service: Ophthalmology;  Laterality: Right;   COLONOSCOPY W/ BIOPSIES AND POLYPECTOMY     COLONOSCOPY WITH PROPOFOL N/A 09/07/2014   Procedure: COLONOSCOPY WITH PROPOFOL;  Surgeon: Juanita Craver, MD;  Location: [facility];  Service: Endoscopy;  Laterality: N/A;   COLONOSCOPY WITH PROPOFOL N/A 01/10/2020   Procedure: COLONOSCOPY WITH PROPOFOL;  Surgeon: Juanita Craver, MD;  [facility];  Service: Endoscopy;  Laterality: N/A;   CORONARY ANGIOPLASTY WITH STENT PLACEMENT  01/15/2017   CORONARY ATHERECTOMY N/A 01/15/2017   Procedure: CORONARY ATHERECTOMY;  Surgeon: Nelva Bush, MD;  Location: [facility];  Service: Cardiovascular;  Laterality: N/A;   CORONARY BALLOON ANGIOPLASTY N/A 01/15/2017   Procedure: CORONARY BALLOON ANGIOPLASTY;  Surgeon: Nelva Bush, MD;  Location: [facility];  Service: Cardiovascular;  Laterality: N/A;   CORONARY STENT INTERVENTION N/A 01/15/2017   Procedure: CORONARY STENT INTERVENTION;  Surgeon: Nelva Bush, MD;  Location: [facility];  Service: Cardiovascular;  Laterality: N/A;   ESOPHAGOGASTRODUODENOSCOPY (EGD) WITH PROPOFOL N/A 09/07/2014   Procedure: ESOPHAGOGASTRODUODENOSCOPY (EGD) WITH PROPOFOL;  Surgeon: Juanita Craver, MD;  [facility];  Service: Endoscopy;  Laterality: N/A;   EXTERNAL EAR SURGERY Bilateral 1970s   tumors removed   EYE SURGERY Left 2019   cataract extraction    EYE SURGERY Right 02/09/2018   eyelid surgery    INTRAVASCULAR PRESSURE WIRE/FFR STUDY N/A 12/12/2016   Procedure: INTRAVASCULAR PRESSURE WIRE/FFR STUDY;  Surgeon: Nelva Bush, MD;  Location: [facility];  Service: Cardiovascular;  Laterality: N/A;   MINI SHUNT INSERTION Right 07/20/2013   Procedure: INSERTION OF GLAUCOMA FILTRATION DEVICE RIGHT EYE;  Surgeon: Marylynn Pearson, MD;  Location: [facility];  Service: Ophthalmology;  Laterality: Right;    MITOMYCIN C APPLICATION Right 2/44/0102   Procedure: MITOMYCIN C APPLICATION;  Surgeon: Marylynn Pearson, MD;  Location: [facility];  Service: Ophthalmology;  Laterality: Right;   MITOMYCIN C APPLICATION Right 09/04/3662   Procedure: MITOMYCIN C APPLICATION RIGHT EYE;  Surgeon: Marylynn Pearson, MD;  Location: [facility];  Service: Ophthalmology;  Laterality: Right;   PLACEMENT OF BREAST IMPLANTS Bilateral 1992   "took all my breast tissue out; put implants in;fibrocystic breast disease "   POLYPECTOMY  01/10/2020   Procedure: POLYPECTOMY;  Surgeon: Juanita Craver, MD;  [facility];  Service: Endoscopy;;   RIGHT/LEFT HEART CATH AND CORONARY ANGIOGRAPHY N/A 12/12/2016   Procedure: RIGHT/LEFT HEART CATH AND CORONARY ANGIOGRAPHY;  Surgeon: Nelva Bush, MD;  Location: [facility];  Service: Cardiovascular;  Laterality: N/A;   TONSILLECTOMY     TOTAL ABDOMINAL HYSTERECTOMY  1982   TRABECULECTOMY Right 02/21/2015   Procedure: TRABECULECTOMY WITH [facility] ON THE RIGHT EYE;  Surgeon: Marylynn Pearson, MD;  Location: [facility];  Service: Ophthalmology;  Laterality: Right;   Patient Active Problem List   Diagnosis Date Noted   IPF (idiopathic pulmonary fibrosis) ([place]) 08/20/2021   Urinary frequency 06/19/2021   Urinary tract infection without hematuria 06/19/2021   Imbalance 06/19/2021   Memory changes 06/19/2021   Drug therapy 06/19/2021   Back pain 05/24/2020   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 heart failure ([place]) 04/16/2020   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) ([place]) 04/13/2020   Chronic diastolic heart failure ([place]) 03/28/2020   Uncontrolled type 2 diabetes mellitus with hyperglycemia ([place]) 07/25/2019   Hiatal hernia 07/25/2019   Spontaneous ecchymoses 07/25/2019   Class 1 obesity due to excess calories with body mass index (BMI) of 31.0 to 31.9 in adult 07/25/2019   Peripheral vascular disease, unspecified ([place]) 03/23/2019   Uveitis  06/01/2017   Fibromyalgia 06/01/2017   DDD (degenerative disc disease), lumbar 06/01/2017   DDD (degenerative disc disease), thoracic 06/01/2017   History of hypercholesterolemia 06/01/2017   History of anxiety 06/01/2017   History of IBS 06/01/2017   Premature atrial contractions 03/30/2017   PVC's (premature ventricular contractions) 03/30/2017   Diabetes mellitus without complication ([place]) 40/34/7425   Angina pectoris ([place]) 01/15/2017   CAD (coronary artery disease) 01/12/2017   Shortness of breath 12/12/2016   Acute maxillary sinusitis 05/03/2014   Obstructive sleep apnea 11/04/2013   Multiple lung nodules 07/18/2012   Heart palpitations 03/11/2012   HTN (hypertension) 03/11/2012   Gastroesophageal reflux disease without esophagitis 07/13/2011   Sarcoidosis 02/08/2011   COPD with acute exacerbation ([place]) 02/05/2011   Hyperlipemia 09/24/2010   Atypical chest pain 09/24/2010   NSVT (nonsustained ventricular tachycardia) ([place]) 09/24/2010   Leg edema 09/24/2010    REFERRING DIAG: R26.89 (ICD-10-CM) - Imbalance   THERAPY DIAG:  Other abnormalities of gait and mobility  Unsteadiness on feet  Lymphedema,  not elsewhere classified  Rationale for Evaluation and Treatment Rehabilitation  PERTINENT HISTORY: Sarcoidosis, headaches, NSVT, HLD, leg edema, HTN, OSA, CAD, angina, DM2, PACs, PVCs, Fibromyalgia, PVD, chronic diastolic heart failure, recent diagnosis of pulmonary fibrosis  PRECAUTIONS: fall  SUBJECTIVE: Patient reports doing "so-so." She reports falling 2 times since last visit. Once down the stairs with her dogs tripping her and 2nd time walking over asphalt. Denies hitting her head, but did sustain some minor abrasions. Does reports some GI upset today.   PAIN:  Are you having pain? No   TODAY'S TREATMENT:  Gait:   [facility] PT Assessment - 10/02/21 0001       Functional Gait  Assessment   Gait assessed  Yes    Gait Level Surface Walks 20 ft in less than 7 sec but  greater than 5.5 sec, uses assistive device, slower speed, mild gait deviations, or deviates 6-10 in outside of the 12 in walkway width.    Change in Gait Speed Able to change speed, demonstrates mild gait deviations, deviates 6-10 in outside of the 12 in walkway width, or no gait deviations, unable to achieve a major change in velocity, or uses a change in velocity, or uses an assistive device.    Gait with Horizontal Head Turns Performs head turns with moderate changes in gait velocity, slows down, deviates 10-15 in outside 12 in walkway width but recovers, can continue to walk.    Gait with Vertical Head Turns Performs task with slight change in gait velocity (eg, minor disruption to smooth gait path), deviates 6 - 10 in outside 12 in walkway width or uses assistive device    Gait and Pivot Turn Pivot turns safely in greater than 3 sec and stops with no loss of balance, or pivot turns safely within 3 sec and stops with mild imbalance, requires small steps to catch balance.    Step Over Obstacle Is able to step over one shoe box (4.5 in total height) but must slow down and adjust steps to clear box safely. May require verbal cueing.    Gait with Narrow Base of Support Ambulates 4-7 steps.    Gait with Eyes Closed Walks 20 ft, slow speed, abnormal gait pattern, evidence for imbalance, deviates 10-15 in outside 12 in walkway width. Requires more than 9 sec to ambulate 20 ft.    Ambulating Backwards Walks 20 ft, slow speed, abnormal gait pattern, evidence for imbalance, deviates 10-15 in outside 12 in walkway width.    Steps Alternating feet, must use rail.    Total Score 15           2MWT: 327 (5/10 fatigue)  -established walking program (see below)   Therex:  -HEP (see below)   PATIENT EDUCATION: Education details: HEP, walking program, PT POC  Person educated: Patient Education method: Explanation Education comprehension: verbalized understanding     HOME EXERCISE PROGRAM: You Can  Walk For A Certain Length Of Time Each Day                          Walk 5 minutes 2 times per day.             Increase 2  minutes every 3 days              Work up to 30 minutes (1-2 times per day).  Access Code: [email or web address] URL: [email or web address] Date: 10/02/2021 Prepared by: Estevan Ryder  Exercises - Sit to Stand with  Counter Support  - 1 x daily - 7 x weekly - 3 sets - 10 reps - Standing March with Counter Support  - 1 x daily - 7 x weekly - 3 sets - 10 reps - Standing Heel Raises  - 1 x daily - 7 x weekly - 3 sets - 10 reps - Mini Squat with Counter Support  - 1 x daily - 7 x weekly - 3 sets - 10 reps - Narrow Stance with Counter Support  - 1 x daily - 7 x weekly - 3 sets - 10 reps - Semi-Tandem Balance at [employer] Eyes Open  - 1 x daily - 7 x weekly - 3 sets - 10 reps        GOALS: Goals reviewed with patient? Yes   SHORT TERM GOALS: Target date: 10/18/2021   Pt will be independent with strength and balance HEP. Baseline:  To be established. Goal status: INITIAL   2.  Pt will decrease 5xSTS to </=16 seconds w/o UE support in order to demonstrate decreased risk for falls and improved functional bilateral LE strength and power. Baseline: 19.60 sec w/ intermittent UE support Goal status: INITIAL   3.  Pt will demonstrate CogTUG of </=20 seconds in order to decrease risk of falls and improve functional mobility using LRAD. Baseline: 24.66 sec no AD Goal status: INITIAL   4.  Patient will improve 2MWT to >/= 371f with LRAD for improved community ambulation and endurance Baseline: 3224fGoal status: INITIAL   5.  Pt will demonstrate teachback of fall prevention strategies and safety measures in the event of a fall in order to promote safety in home environment. Baseline:  Handout to be provided if needed. Goal status: INITIAL   LONG TERM GOALS: Target date: 11/15/2021   Pt will report compliance to walking program x3 days per week to promote aerobic  gains and maintain LE strength gains outside clinic. Baseline: To be established. Goal status: INITIAL   2.  Pt will decrease 5xSTS to </=13 seconds w/o UE support in order to demonstrate decreased risk for falls and improved functional bilateral LE strength and power. Baseline: 19.60 sec w/ intermittent UE support Goal status: INITIAL   3.  Pt will demonstrate CogTUG of </=15 seconds in order to decrease risk of falls and improve functional mobility using LRAD. Baseline: 24.66 sec no AD Goal status: INITIAL   4.  Pt will demonstrate floor recovery using no more than minA to promote improved safety and independence in the event of a fall in her home environment. Baseline: To be reviewed. Goal status: INITIAL   5.  Pt will improve FGA to >/= 20/30 to demonstrate improved balance and reduced fall risk  Baseline: 15/30 Goal status: INITIAL   ASSESSMENT:   CLINICAL IMPRESSION: Patient seen for skilled PT session with emphasis on OM assessment and HEP establishment. 2 Min Walk Test:  Instructed patient to ambulate as quickly and as safely as possible for 2 minutes using LRAD. Patient was allowed to take standing rest breaks without stopping the test, but if the patient required a sitting rest break the clock would be stopped and the test would be over.  Results: 327 feet. Results indicate that the patient has reduced endurance with ambulation compared to age matched and population- specific norms.  Age Matched and population specific Norms: community dwelling elderly adults: [id number]: 567.5ft 66fC: 63ft)19fI: 358.5ft. P44fent demonstrates increased fall risk as noted by score of 15/30 on  Functional Gait Assessment.   <22/30 = predictive of falls, <20/30 = fall in 6 months, <18/30 = predictive of falls in PD MCID: 5 points stroke population, 4 points geriatric population (ANPTA Core Set of Outcome Measures for Adults with Neurologic Conditions, 2018). Patient tolerating HEP  well. Continue POC.    OBJECTIVE IMPAIRMENTS Abnormal gait, cardiopulmonary status limiting activity, decreased activity tolerance, decreased balance, decreased strength, increased edema, and postural dysfunction.    ACTIVITY LIMITATIONS lifting, bending, squatting, transfers, and locomotion level   PARTICIPATION LIMITATIONS: meal prep, laundry, and community activity   PERSONAL FACTORS Age, Fitness, Past/current experiences, Time since onset of injury/illness/exacerbation, and 3+ comorbidities: Sarcoidosis, pulmonary fibrosis, LE edema, chronic diastolic heart failure  are also affecting patient's functional outcome.    REHAB POTENTIAL: Good   CLINICAL DECISION MAKING: Evolving/moderate complexity   EVALUATION COMPLEXITY: Moderate   PLAN: PT FREQUENCY: 1x/week   PT DURATION: 8 weeks   PLANNED INTERVENTIONS: Therapeutic exercises, Therapeutic activity, Neuromuscular re-education, Balance training, Gait training, Patient/Family education, Self Care, Stair training, Vestibular training, DME instructions, and Re-evaluation   PLAN FOR NEXT SESSION:  for LE strength and balance, fall recovery/prevention strategies, ambulatory balance, compliant surfaces, NBOS/tandem, balance strategies    Debbora Dus, PT Debbora Dus, PT, DPT, CBIS  10/02/2021, 11:27 AM

## 2021-10-04 LAB — SJOGREN'S SYNDROME ANTIBODS(SSA + SSB)
SSA (Ro) (ENA) Antibody, IgG: <1 AI
SSB (La) (ENA) Antibody, IgG: <1 AI

## 2021-10-04 LAB — CYCLIC CITRUL PEPTIDE ANTIBODY, IGG: Cyclic Citrullin Peptide Ab: <16 UNITS

## 2021-10-04 LAB — ANGIOTENSIN CONVERTING ENZYME: Angiotensin-Converting Enzyme: 29 U/L (ref 9–67)

## 2021-10-04 LAB — ANTI-DNA ANTIBODY, DOUBLE-STRANDED: ds DNA Ab: <1 IU/mL

## 2021-10-04 LAB — RHEUMATOID FACTOR: Rheumatoid fact SerPl-aCnc: <14 IU/mL (ref <14)

## 2021-10-04 LAB — ANA: Anti Nuclear Antibody (ANA): POSITIVE — AB

## 2021-10-04 LAB — ANTI-SCLERODERMA ANTIBODY: Scleroderma (Scl-70) (ENA) Antibody, IgG: <1 AI

## 2021-10-04 LAB — ANTI-NUCLEAR AB-TITER (ANA TITER)
ANA TITER: 1:40 {titer} — ABNORMAL HIGH
ANA Titer 1: 1:40 {titer} — ABNORMAL HIGH

## 2021-10-07 DIAGNOSIS — F331 Major depressive disorder, recurrent, moderate: Secondary | ICD-10-CM | POA: Diagnosis not present

## 2021-10-09 ENCOUNTER — Ambulatory Visit: Payer: Medicare Other

## 2021-10-09 DIAGNOSIS — R2689 Other abnormalities of gait and mobility: Secondary | ICD-10-CM

## 2021-10-09 DIAGNOSIS — R2681 Unsteadiness on feet: Secondary | ICD-10-CM

## 2021-10-09 DIAGNOSIS — I890 Lymphedema, not elsewhere classified: Secondary | ICD-10-CM | POA: Diagnosis not present

## 2021-10-09 DIAGNOSIS — G4733 Obstructive sleep apnea (adult) (pediatric): Secondary | ICD-10-CM | POA: Diagnosis not present

## 2021-10-09 LAB — HYPERSENSITIVITY PNEUMONITIS
A. Pullulans Abs: NEGATIVE
A.Fumigatus #1 Abs: NEGATIVE
Micropolyspora faeni, IgG: NEGATIVE
Pigeon Serum Abs: NEGATIVE
Thermoact. Saccharii: NEGATIVE
Thermoactinomyces vulgaris, IgG: NEGATIVE

## 2021-10-09 LAB — RNP ANTIBODIES: ENA RNP Ab: <0.2 AI (ref 0.0–0.9)

## 2021-10-09 NOTE — Therapy (Signed)
OUTPATIENT PHYSICAL THERAPY TREATMENT NOTE   Patient Name: Sheila Reynolds MRN: [id number] DOB:[birth year], 76 y.o., female Today's Date: 10/09/2021  PCP: Glendale Chard, MD REFERRING PROVIDER: Frann Rider, NP   END OF SESSION:   PT End of Session - 10/09/21 1024     Visit Number 3    Number of Visits 9    Date for PT Re-Evaluation 11/29/21    Authorization Type MEDICARE PART A AND B    PT Start Time 1022   patient late   PT Stop Time 1100    PT Time Calculation (min) 38 min    Equipment Utilized During Treatment Gait belt    Activity Tolerance Patient tolerated treatment well    Behavior During Therapy WFL for tasks assessed/performed             Past Medical History:  Diagnosis Date   Anemia    3 months ago anemic   Anxiety    on meds   Arthritis    "all over" (01/15/2017)   Asthma    Bronchitis with emphysema    Chest pain    Chronic bronchitis ([place])    Coronary artery disease    a. 01/2017 she underwent orbital atherectomy/DES to the proxmal LAD and PTCA to ostial D2. 2D Echo 01/15/17 showed mild LVH, EF 60-65%, grade 1 DD.   Family history of anesthesia complication    daughter N/V   Fibromyalgia    GERD (gastroesophageal reflux disease)    on meds   Glaucoma    Heart murmur    History of hiatal hernia    Hx of echocardiogram    Echo (03/2013):  Tech limited; Mild focal basal septal hypertrophy, EF 60-65%, normal RVF   Hyperlipidemia    Hypertension    Nonsustained ventricular tachycardia (HCC)    OSA on CPAP    Premature atrial contractions    PVC (premature ventricular contraction)    a. Holter 12/16: NSR, occ PAC,PVCs   Sarcoidosis    Type II diabetes mellitus ([place])    Past Surgical History:  Procedure Laterality Date   BREAST SURGERY     CARDIAC CATHETERIZATION  05/04/2007   reveals overall normal left ventricular systolic function. Ejection fraction 65-70%   CATARACT EXTRACTION W/PHACO Right 07/20/2013   Procedure: CATARACT EXTRACTION  PHACO AND INTRAOCULAR LENS PLACEMENT (IOC);  Surgeon: Marylynn Pearson, MD;  Location: [place];  Service: Ophthalmology;  Laterality: Right;   COLONOSCOPY W/ BIOPSIES AND POLYPECTOMY     COLONOSCOPY WITH PROPOFOL N/A 09/07/2014   Procedure: COLONOSCOPY WITH PROPOFOL;  Surgeon: Juanita Craver, MD;  Location: [facility];  Service: Endoscopy;  Laterality: N/A;   COLONOSCOPY WITH PROPOFOL N/A 01/10/2020   Procedure: COLONOSCOPY WITH PROPOFOL;  Surgeon: Juanita Craver, MD;  [facility];  Service: Endoscopy;  Laterality: N/A;   CORONARY ANGIOPLASTY WITH STENT PLACEMENT  01/15/2017   CORONARY ATHERECTOMY N/A 01/15/2017   Procedure: CORONARY ATHERECTOMY;  Surgeon: Nelva Bush, MD;  Location: [facility];  Service: Cardiovascular;  Laterality: N/A;   CORONARY BALLOON ANGIOPLASTY N/A 01/15/2017   Procedure: CORONARY BALLOON ANGIOPLASTY;  Surgeon: Nelva Bush, MD;  Location: [place] [facility];  Service: Cardiovascular;  Laterality: N/A;   CORONARY STENT INTERVENTION N/A 01/15/2017   Procedure: CORONARY STENT INTERVENTION;  Surgeon: Nelva Bush, MD;  Location: [place] [facility];  Service: Cardiovascular;  Laterality: N/A;   ESOPHAGOGASTRODUODENOSCOPY (EGD) WITH PROPOFOL N/A 09/07/2014   Procedure: ESOPHAGOGASTRODUODENOSCOPY (EGD) WITH PROPOFOL;  Surgeon: Juanita Craver, MD;  [facility];  Service: Endoscopy;  Laterality: N/A;   EXTERNAL EAR SURGERY Bilateral 1970s   tumors removed   EYE SURGERY Left 2019   cataract extraction    EYE SURGERY Right 02/09/2018   eyelid surgery    INTRAVASCULAR PRESSURE WIRE/FFR STUDY N/A 12/12/2016   Procedure: INTRAVASCULAR PRESSURE WIRE/FFR STUDY;  Surgeon: Nelva Bush, MD;  Location: [place] [facility];  Service: Cardiovascular;  Laterality: N/A;   MINI SHUNT INSERTION Right 07/20/2013   Procedure: INSERTION OF GLAUCOMA FILTRATION DEVICE RIGHT EYE;  Surgeon: Marylynn Pearson, MD;  Location: [place];  Service: Ophthalmology;  Laterality: Right;    MITOMYCIN C APPLICATION Right 06/12/7739   Procedure: MITOMYCIN C APPLICATION;  Surgeon: Marylynn Pearson, MD;  Location: [place];  Service: Ophthalmology;  Laterality: Right;   MITOMYCIN C APPLICATION Right 2/87/8676   Procedure: MITOMYCIN C APPLICATION RIGHT EYE;  Surgeon: Marylynn Pearson, MD;  Location: [place];  Service: Ophthalmology;  Laterality: Right;   PLACEMENT OF BREAST IMPLANTS Bilateral 1992   "took all my breast tissue out; put implants in;fibrocystic breast disease "   POLYPECTOMY  01/10/2020   Procedure: POLYPECTOMY;  Surgeon: Juanita Craver, MD;  [facility];  Service: Endoscopy;;   RIGHT/LEFT HEART CATH AND CORONARY ANGIOGRAPHY N/A 12/12/2016   Procedure: RIGHT/LEFT HEART CATH AND CORONARY ANGIOGRAPHY;  Surgeon: Nelva Bush, MD;  Location: [place] [facility];  Service: Cardiovascular;  Laterality: N/A;   TONSILLECTOMY     TOTAL ABDOMINAL HYSTERECTOMY  1982   TRABECULECTOMY Right 02/21/2015   Procedure: TRABECULECTOMY WITH [facility] ON THE RIGHT EYE;  Surgeon: Marylynn Pearson, MD;  Location: [place];  Service: Ophthalmology;  Laterality: Right;   Patient Active Problem List   Diagnosis Date Noted   IPF (idiopathic pulmonary fibrosis) ([place]) 08/20/2021   Urinary frequency 06/19/2021   Urinary tract infection without hematuria 06/19/2021   Imbalance 06/19/2021   Memory changes 06/19/2021   Drug therapy 06/19/2021   Back pain 05/24/2020   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 heart failure ([place]) 04/16/2020   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) ([place]) 04/13/2020   Chronic diastolic heart failure ([place]) 03/28/2020   Uncontrolled type 2 diabetes mellitus with hyperglycemia ([place]) 07/25/2019   Hiatal hernia 07/25/2019   Spontaneous ecchymoses 07/25/2019   Class 1 obesity due to excess calories with body mass index (BMI) of 31.0 to 31.9 in adult 07/25/2019   Peripheral vascular disease, unspecified ([place]) 03/23/2019   Uveitis  06/01/2017   Fibromyalgia 06/01/2017   DDD (degenerative disc disease), lumbar 06/01/2017   DDD (degenerative disc disease), thoracic 06/01/2017   History of hypercholesterolemia 06/01/2017   History of anxiety 06/01/2017   History of IBS 06/01/2017   Premature atrial contractions 03/30/2017   PVC's (premature ventricular contractions) 03/30/2017   Diabetes mellitus without complication ([place]) 72/10/4707   Angina pectoris ([place]) 01/15/2017   CAD (coronary artery disease) 01/12/2017   Shortness of breath 12/12/2016   Acute maxillary sinusitis 05/03/2014   Obstructive sleep apnea 11/04/2013   Multiple lung nodules 07/18/2012   Heart palpitations 03/11/2012   HTN (hypertension) 03/11/2012   Gastroesophageal reflux disease without esophagitis 07/13/2011   Sarcoidosis 02/08/2011   COPD with acute exacerbation ([place]) 02/05/2011   Hyperlipemia 09/24/2010   Atypical chest pain 09/24/2010   NSVT (nonsustained ventricular tachycardia) ([place]) 09/24/2010   Leg edema 09/24/2010    REFERRING DIAG: R26.89 (ICD-10-CM) - Imbalance   THERAPY DIAG:  Other abnormalities of gait and mobility  Unsteadiness on feet  Lymphedema,  not elsewhere classified  Rationale for Evaluation and Treatment Rehabilitation  PERTINENT HISTORY: Sarcoidosis, headaches, NSVT, HLD, leg edema, HTN, OSA, CAD, angina, DM2, PACs, PVCs, Fibromyalgia, PVD, chronic diastolic heart failure, recent diagnosis of pulmonary fibrosis  PRECAUTIONS: fall  SUBJECTIVE: Patient reports doing fair. Increase in sinus pain.   PAIN:  Are you having pain? No   TODAY'S TREATMENT:  NMR:  -standing on foam normal BOS, NBOS, EO/EC -standing on rockerboard A/P; EO/EC normal BOS, NBOS -tandem walking on foam beam in // bars B UE -> no UE  -fwd lunge onto bosu B UE x10  -lateral lunge on Bosu x10 B UE support  -ankle proprioceptive exercises on wobble board x5 revolutions B LE    PATIENT EDUCATION: Education details: continue HEP,  balance strategies  Person educated: Patient Education method: Explanation Education comprehension: verbalized understanding     HOME EXERCISE PROGRAM: You Can Walk For A Certain Length Of Time Each Day                          Walk 5 minutes 2 times per day.             Increase 2  minutes every 3 days              Work up to 30 minutes (1-2 times per day).  Access Code: [email or web address] URL: [email or web address] Date: 10/02/2021 Prepared by: Estevan Ryder  Exercises - Sit to Stand with Counter Support  - 1 x daily - 7 x weekly - 3 sets - 10 reps - Standing March with Counter Support  - 1 x daily - 7 x weekly - 3 sets - 10 reps - Standing Heel Raises  - 1 x daily - 7 x weekly - 3 sets - 10 reps - Mini Squat with Counter Support  - 1 x daily - 7 x weekly - 3 sets - 10 reps - Narrow Stance with Counter Support  - 1 x daily - 7 x weekly - 3 sets - 10 reps - Semi-Tandem Balance at [employer] Eyes Open  - 1 x daily - 7 x weekly - 3 sets - 10 reps        GOALS: Goals reviewed with patient? Yes   SHORT TERM GOALS: Target date: 10/18/2021   Pt will be independent with strength and balance HEP. Baseline:  To be established. Goal status: INITIAL   2.  Pt will decrease 5xSTS to </=16 seconds w/o UE support in order to demonstrate decreased risk for falls and improved functional bilateral LE strength and power. Baseline: 19.60 sec w/ intermittent UE support Goal status: INITIAL   3.  Pt will demonstrate CogTUG of </=20 seconds in order to decrease risk of falls and improve functional mobility using LRAD. Baseline: 24.66 sec no AD Goal status: INITIAL   4.  Patient will improve 2MWT to >/= 333f with LRAD for improved community ambulation and endurance Baseline: 324fGoal status: INITIAL   5.  Pt will demonstrate teachback of fall prevention strategies and safety measures in the event of a fall in order to promote safety in home environment. Baseline:  Handout to be  provided if needed. Goal status: INITIAL   LONG TERM GOALS: Target date: 11/15/2021   Pt will report compliance to walking program x3 days per week to promote aerobic gains and maintain LE strength gains outside clinic. Baseline: To be established. Goal status: INITIAL   2.  Pt will decrease  5xSTS to </=13 seconds w/o UE support in order to demonstrate decreased risk for falls and improved functional bilateral LE strength and power. Baseline: 19.60 sec w/ intermittent UE support Goal status: INITIAL   3.  Pt will demonstrate CogTUG of </=15 seconds in order to decrease risk of falls and improve functional mobility using LRAD. Baseline: 24.66 sec no AD Goal status: INITIAL   4.  Pt will demonstrate floor recovery using no more than minA to promote improved safety and independence in the event of a fall in her home environment. Baseline: To be reviewed. Goal status: INITIAL   5.  Pt will improve FGA to >/= 20/30 to demonstrate improved balance and reduced fall risk  Baseline: 15/30 Goal status: INITIAL   ASSESSMENT:   CLINICAL IMPRESSION: Patient seen for skilled PT session with emphasis on balance retraining. Patient demonstrating appropriate ankle strategy on compliant surfaces, but with limited ability to fully regain her balance unless implementing hip or stepping strategy, especially with eyes closed. Initiated ankle proprioceptive exercises. Continue POC.    OBJECTIVE IMPAIRMENTS Abnormal gait, cardiopulmonary status limiting activity, decreased activity tolerance, decreased balance, decreased strength, increased edema, and postural dysfunction.    ACTIVITY LIMITATIONS lifting, bending, squatting, transfers, and locomotion level   PARTICIPATION LIMITATIONS: meal prep, laundry, and community activity   PERSONAL FACTORS Age, Fitness, Past/current experiences, Time since onset of injury/illness/exacerbation, and 3+ comorbidities: Sarcoidosis, pulmonary fibrosis, LE edema,  chronic diastolic heart failure  are also affecting patient's functional outcome.    REHAB POTENTIAL: Good   CLINICAL DECISION MAKING: Evolving/moderate complexity   EVALUATION COMPLEXITY: Moderate   PLAN: PT FREQUENCY: 1x/week   PT DURATION: 8 weeks   PLANNED INTERVENTIONS: Therapeutic exercises, Therapeutic activity, Neuromuscular re-education, Balance training, Gait training, Patient/Family education, Self Care, Stair training, Vestibular training, DME instructions, and Re-evaluation   PLAN FOR NEXT SESSION:  for LE strength and balance, fall recovery/prevention strategies, ambulatory balance, compliant surfaces, NBOS/tandem, balance strategies    Debbora Dus, PT Debbora Dus, PT, DPT, CBIS  10/09/2021, 11:09 AM

## 2021-10-10 ENCOUNTER — Ambulatory Visit: Payer: Medicare Other | Admitting: Adult Health

## 2021-10-11 ENCOUNTER — Other Ambulatory Visit: Payer: Self-pay | Admitting: Internal Medicine

## 2021-10-15 ENCOUNTER — Other Ambulatory Visit: Payer: Self-pay

## 2021-10-15 ENCOUNTER — Ambulatory Visit (INDEPENDENT_AMBULATORY_CARE_PROVIDER_SITE_OTHER): Payer: Medicare Other | Admitting: Internal Medicine

## 2021-10-15 ENCOUNTER — Encounter: Payer: Self-pay | Admitting: Internal Medicine

## 2021-10-15 VITALS — BP 120/80 | HR 88 | Temp 98.3°F | Ht 62.0 in | Wt 163.6 lb

## 2021-10-15 DIAGNOSIS — E1165 Type 2 diabetes mellitus with hyperglycemia: Secondary | ICD-10-CM

## 2021-10-15 DIAGNOSIS — Z6829 Body mass index (BMI) 29.0-29.9, adult: Secondary | ICD-10-CM

## 2021-10-15 DIAGNOSIS — R0982 Postnasal drip: Secondary | ICD-10-CM | POA: Diagnosis not present

## 2021-10-15 DIAGNOSIS — I5032 Chronic diastolic (congestive) heart failure: Secondary | ICD-10-CM | POA: Diagnosis not present

## 2021-10-15 DIAGNOSIS — E1169 Type 2 diabetes mellitus with other specified complication: Secondary | ICD-10-CM | POA: Diagnosis not present

## 2021-10-15 DIAGNOSIS — I25119 Atherosclerotic heart disease of native coronary artery with unspecified angina pectoris: Secondary | ICD-10-CM | POA: Diagnosis not present

## 2021-10-15 DIAGNOSIS — I110 Hypertensive heart disease with heart failure: Secondary | ICD-10-CM | POA: Diagnosis not present

## 2021-10-15 DIAGNOSIS — E785 Hyperlipidemia, unspecified: Secondary | ICD-10-CM

## 2021-10-15 DIAGNOSIS — D649 Anemia, unspecified: Secondary | ICD-10-CM | POA: Diagnosis not present

## 2021-10-15 DIAGNOSIS — R768 Other specified abnormal immunological findings in serum: Secondary | ICD-10-CM | POA: Diagnosis not present

## 2021-10-15 NOTE — Patient Instructions (Signed)

## 2021-10-15 NOTE — Progress Notes (Signed)
[name],acting as a Education administrator for Maximino Greenland, MD.,have documented all relevant documentation on the behalf of Maximino Greenland, MD,as directed by  Maximino Greenland, MD while in the presence of Maximino Greenland, MD.    Subjective:     Patient ID: Sheila Reynolds , female    DOB: [birth year] , 76 y.o.   MRN: [id number]   Chief Complaint  Patient presents with   Diabetes   Hypertension    HPI  Patient presents today for DM/BP check.  She reports compliance with meds. She has not had any issues with her medication.  Denies headaches, chest pain and worsening shortness of breath.  Diabetes She presents for her follow-up diabetic visit. She has type 2 diabetes mellitus. Her disease course has been stable. There are no hypoglycemic associated symptoms. Pertinent negatives for hypoglycemia include no dizziness or headaches. Pertinent negatives for diabetes include no blurred vision, no chest pain, no polydipsia, no polyphagia, no polyuria and no weakness. There are no hypoglycemic complications. Diabetic complications include heart disease. Her weight is stable. Her breakfast blood glucose is taken between 7-8 am. Her breakfast blood glucose range is generally 90-110 mg/dl. An ACE inhibitor/angiotensin II receptor blocker is being taken. Eye exam is current.  Hypertension This is a chronic problem. The current episode started more than 1 year ago. The problem has been gradually improving since onset. The problem is controlled. Pertinent negatives include no blurred vision, chest pain, headaches, orthopnea, palpitations or shortness of breath. Risk factors for coronary artery disease include diabetes mellitus, dyslipidemia, post-menopausal state and sedentary lifestyle. The current treatment provides moderate improvement. Compliance problems include exercise.      Past Medical History:  Diagnosis Date   Anemia    3 months ago anemic   Anxiety    on meds   Arthritis    "all over"  (01/15/2017)   Asthma    Bronchitis with emphysema    Chest pain    Chronic bronchitis ([place])    Coronary artery disease    a. 01/2017 she underwent orbital atherectomy/DES to the proxmal LAD and PTCA to ostial D2. 2D Echo 01/15/17 showed mild LVH, EF 60-65%, grade 1 DD.   Family history of anesthesia complication    daughter N/V   Fibromyalgia    GERD (gastroesophageal reflux disease)    on meds   Glaucoma    Heart murmur    History of hiatal hernia    Hx of echocardiogram    Echo (03/2013):  Tech limited; Mild focal basal septal hypertrophy, EF 60-65%, normal RVF   Hyperlipidemia    Hypertension    Nonsustained ventricular tachycardia (HCC)    OSA on CPAP    Premature atrial contractions    PVC (premature ventricular contraction)    a. Holter 12/16: NSR, occ PAC,PVCs   Sarcoidosis    Type II diabetes mellitus ([place])      Family History  Problem Relation Age of Onset   Heart disease Father    Diabetes Father    Glaucoma Father    Cancer Mother        unknown type, ?lung   Heart disease Paternal Grandmother    Cancer Paternal Grandmother        unknown type   Diabetes Brother    Glaucoma Brother      Current Outpatient Medications:    acetaminophen (TYLENOL) 500 MG tablet, Take 1,000 mg by mouth 2 (two) times daily as needed for moderate pain  or headache., Disp: , Rfl:    albuterol (PROVENTIL) (2.5 MG/3ML) 0.083% nebulizer solution, Take 3 mLs (2.5 mg total) by nebulization daily as needed for wheezing or shortness of breath., Disp: 75 mL, Rfl: 12   albuterol (VENTOLIN HFA) 108 (90 Base) MCG/ACT inhaler, Inhale 2 puffs into the lungs every 6 (six) hours as needed for wheezing or shortness of breath., Disp: 18 g, Rfl: 12   aspirin EC 81 MG tablet, Take 81 mg by mouth daily. Swallow whole., Disp: , Rfl:    atorvastatin (LIPITOR) 80 MG tablet, Take 1 tablet (80 mg total) by mouth daily., Disp: 90 tablet, Rfl: 3   azelastine (ASTELIN) 0.1 % nasal spray, Place 1 spray into both  nostrils 2 (two) times daily. Use in each nostril as directed, Disp: 30 mL, Rfl: 1   benzonatate (TESSALON) 200 MG capsule, Take 1 capsule (200 mg total) by mouth 3 (three) times daily as needed for cough., Disp: 30 capsule, Rfl: 2   Blood Glucose Monitoring Suppl (ACCU-CHEK AVIVA PLUS) w/Device KIT, Use to check blood sugars 3 times a day. Dx code e11.65, Disp: 1 kit, Rfl: 3   brimonidine (ALPHAGAN P) 0.1 % SOLN, Place 1 drop into both eyes 2 (two) times daily. , Disp: , Rfl:    brinzolamide (AZOPT) 1 % ophthalmic suspension, Place 1 drop into both eyes 3 (three) times daily., Disp: , Rfl:    carvedilol (COREG) 12.5 MG tablet, TAKE 1 TABLET(12.5 MG) BY MOUTH TWICE DAILY, Disp: 60 tablet, Rfl: 0   cetirizine (ZYRTEC ALLERGY) 10 MG tablet, Take 1 tablet (10 mg total) by mouth daily., Disp: 30 tablet, Rfl: 2   clopidogrel (PLAVIX) 75 MG tablet, TAKE 1 TABLET(75 MG) BY MOUTH DAILY, Disp: 90 tablet, Rfl: 3   EPINEPHrine 0.3 mg/0.3 mL IJ SOAJ injection, Inject 0.3 mg into the muscle as needed for anaphylaxis., Disp: 1 each, Rfl: 0   ezetimibe (ZETIA) 10 MG tablet, Take 1 tablet (10 mg total) by mouth daily., Disp: 90 tablet, Rfl: 3   furosemide (LASIX) 40 MG tablet, Take 1 tablet (40 mg total) by mouth every other day., Disp: 90 tablet, Rfl: 1   isosorbide mononitrate (IMDUR) 60 MG 24 hr tablet, TAKE 1 TABLET(60 MG) BY MOUTH DAILY, Disp: 90 tablet, Rfl: 3   ketorolac (ACULAR) 0.5 % ophthalmic solution, Place 1 drop into the right eye 3 (three) times daily., Disp: , Rfl:    Lancets (ONETOUCH DELICA PLUS QVZDGL87F) MISC, CHECK BLOOD SUGAR BEFORE BREAKFAST AND DINNER, Disp: 100 each, Rfl: 1   montelukast (SINGULAIR) 10 MG tablet, TAKE 1 TABLET BY MOUTH EVERY EVENING, Disp: 90 tablet, Rfl: 1   Nebulizers (COMPRESSOR/NEBULIZER) MISC, Use as directed, Disp: 1 each, Rfl: 0   nitroGLYCERIN (NITROSTAT) 0.4 MG SL tablet, Place 1 tablet (0.4 mg total) under the tongue every 5 (five) minutes as needed for chest  pain., Disp: 25 tablet, Rfl: prn   pantoprazole (PROTONIX) 40 MG tablet, TAKE 1 TABLET(40 MG) BY MOUTH DAILY, Disp: 90 tablet, Rfl: 3   Polyvinyl Alcohol-Povidone PF (REFRESH) 1.4-0.6 % SOLN, Place 1-2 drops into both eyes 3 (three) times daily as needed (dry/irritated eyes.)., Disp: , Rfl:    potassium chloride SA (KLOR-CON) 20 MEQ tablet, Take 1 tablet (20 mEq total) by mouth every other day., Disp: 90 tablet, Rfl: 1   prednisoLONE acetate (PRED FORTE) 1 % ophthalmic suspension, Apply to eye., Disp: , Rfl:    pregabalin (LYRICA) 75 MG capsule, TAKE 1 CAPSULE(75 MG) BY MOUTH  TWICE DAILY, Disp: 180 capsule, Rfl: 2   RHOPRESSA 0.02 % SOLN, Place 1 drop into the left eye at bedtime., Disp: , Rfl:    Travoprost, BAK Free, (TRAVATAN) 0.004 % SOLN ophthalmic solution, Place 1 drop into the right eye at bedtime., Disp: , Rfl:    umeclidinium-vilanterol (ANORO ELLIPTA) 62.5-25 MCG/ACT AEPB, Inhale 1 puff into the lungs daily at 6 (six) AM., Disp: 1 each, Rfl: 12   Allergies  Allergen Reactions   Crestor [Rosuvastatin Calcium] Other (See Comments)    muscle aches   Demerol  [Meperidine Hcl]     Other reaction(s): Hallucinations   Shellfish Allergy Itching and Other (See Comments)    Crab, shrimp and lobster ---lips itch and tingle Was told not to eat again after having a allergy test. Lobster, crab and shrimp      Review of Systems  Constitutional: Negative.   HENT:  Positive for congestion and postnasal drip. Negative for sinus pressure.   Eyes: Negative.  Negative for blurred vision.  Respiratory:  Positive for cough. Negative for shortness of breath.   Cardiovascular: Negative.  Negative for chest pain, palpitations and orthopnea.  Endocrine: Negative for polydipsia, polyphagia and polyuria.  Neurological: Negative.  Negative for dizziness, weakness and headaches.  Psychiatric/Behavioral: Negative.       Today's Vitals   10/15/21 1424  BP: 120/80  Pulse: 88  Temp: 98.3 F (36.8 C)   Weight: 163 lb 9.6 oz (74.2 kg)  Height: 5' 2"  (1.575 m)  PainSc: 0-No pain   Body mass index is 29.92 kg/m.  Wt Readings from Last 3 Encounters:  10/15/21 163 lb 9.6 oz (74.2 kg)  09/30/21 165 lb (74.8 kg)  09/18/21 165 lb 8 oz (75.1 kg)     Objective:  Physical Exam Vitals and nursing note reviewed.  Constitutional:      Appearance: Normal appearance.  HENT:     Head: Normocephalic and atraumatic.  Eyes:     Extraocular Movements: Extraocular movements intact.  Cardiovascular:     Rate and Rhythm: Normal rate and regular rhythm.     Heart sounds: Normal heart sounds.  Pulmonary:     Effort: Pulmonary effort is normal.     Breath sounds: Normal breath sounds.  Musculoskeletal:     Right lower leg: Edema present.     Left lower leg: Edema present.  Skin:    General: Skin is warm.  Neurological:     General: No focal deficit present.     Mental Status: She is alert.  Psychiatric:        Mood and Affect: Mood normal.        Behavior: Behavior normal.      Assessment And Plan:     1. Dyslipidemia associated with type 2 diabetes mellitus ([place]) Comments: Chronic, I will check an a1c today. Importance of dietary/medication compliance was d/w patient. She will f/u in 3-4 months.  - Hemoglobin A1c  2. 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 heart failure ([place]) Comments: Chronic, she will c/w carvedilol 12.45ma bid along with furosemide 41mdaily.  3. Postnasal drip Comments: She will c/w montelukast. I will add cetirizine nightly to her current regimen.   4. Low hemoglobin Comments: She is concerned about recent CBC results. She is concerned about borderline low Hb. I will check repeat CBC today.  - CBC no Diff - Iron, TIBC and Ferritin Panel  5. Positive ANA (antinuclear antibody) Comments: Pulmonary labs reviewed. She agrees to Rheum evaluation,  referral placed. She is encouraged to follow an anti-inflammatory diet.  - Ambulatory referral  to Rheumatology  6. BMI 29.0-29.9,adult Comments: She is encouraged to aim for at least 150 minutes of exercise per week.    Patient was given opportunity to ask questions. Patient verbalized understanding of the plan and was able to repeat key elements of the plan. All questions were answered to their satisfaction.   I, Maximino Greenland, MD, have reviewed all documentation for this visit. The documentation on 10/15/21 for the exam, diagnosis, procedures, and orders are all accurate and complete.   IF YOU HAVE BEEN REFERRED TO A SPECIALIST, IT MAY TAKE 1-2 WEEKS TO SCHEDULE/PROCESS THE REFERRAL. IF YOU HAVE NOT HEARD FROM US/SPECIALIST IN TWO WEEKS, PLEASE GIVE [place] A CALL AT [phone].   THE PATIENT IS ENCOURAGED TO PRACTICE SOCIAL DISTANCING DUE TO THE COVID-19 PANDEMIC.

## 2021-10-16 ENCOUNTER — Ambulatory Visit: Payer: Medicare Other | Attending: Adult Health | Admitting: Physical Therapy

## 2021-10-16 ENCOUNTER — Encounter: Payer: Self-pay | Admitting: Physical Therapy

## 2021-10-16 DIAGNOSIS — R2689 Other abnormalities of gait and mobility: Secondary | ICD-10-CM | POA: Insufficient documentation

## 2021-10-16 DIAGNOSIS — R2681 Unsteadiness on feet: Secondary | ICD-10-CM | POA: Insufficient documentation

## 2021-10-16 DIAGNOSIS — I890 Lymphedema, not elsewhere classified: Secondary | ICD-10-CM | POA: Diagnosis not present

## 2021-10-16 LAB — CBC
Hematocrit: 35.9 % (ref 34.0–46.6)
Hemoglobin: 12 g/dL (ref 11.1–15.9)
MCH: 28.2 pg (ref 26.6–33.0)
MCHC: 33.4 g/dL (ref 31.5–35.7)
MCV: 84 fL (ref 79–97)
Platelets: 224 10*3/uL (ref 150–450)
RBC: 4.26 x10E6/uL (ref 3.77–5.28)
RDW: 13 % (ref 11.7–15.4)
WBC: 7.7 10*3/uL (ref 3.4–10.8)

## 2021-10-16 LAB — IRON,TIBC AND FERRITIN PANEL
Ferritin: 56 ng/mL (ref 15–150)
Iron Saturation: 23 % (ref 15–55)
Iron: 72 ug/dL (ref 27–139)
Total Iron Binding Capacity: 316 ug/dL (ref 250–450)
UIBC: 244 ug/dL (ref 118–369)

## 2021-10-16 LAB — HEMOGLOBIN A1C
Est. average glucose Bld gHb Est-mCnc: 128 mg/dL
Hgb A1c MFr Bld: 6.1 % — ABNORMAL HIGH (ref 4.8–5.6)

## 2021-10-16 NOTE — Therapy (Signed)
OUTPATIENT PHYSICAL THERAPY TREATMENT NOTE   Patient Name: Sheila Reynolds MRN: [id number] DOB:[birth year], 76 y.o., female Today's Date: 10/16/2021  PCP: Glendale Chard, MD REFERRING PROVIDER: Frann Rider, NP   END OF SESSION:   PT End of Session - 10/16/21 1023     Visit Number 4    Number of Visits 9    Date for PT Re-Evaluation 11/29/21    Authorization Type MEDICARE PART A AND B    PT Start Time 1019    PT Stop Time 1100    PT Time Calculation (min) 41 min    Equipment Utilized During Treatment Gait belt    Activity Tolerance Patient tolerated treatment well    Behavior During Therapy WFL for tasks assessed/performed             Past Medical History:  Diagnosis Date   Anemia    3 months ago anemic   Anxiety    on meds   Arthritis    "all over" (01/15/2017)   Asthma    Bronchitis with emphysema    Chest pain    Chronic bronchitis ([place])    Coronary artery disease    a. 01/2017 she underwent orbital atherectomy/DES to the proxmal LAD and PTCA to ostial D2. 2D Echo 01/15/17 showed mild LVH, EF 60-65%, grade 1 DD.   Family history of anesthesia complication    daughter N/V   Fibromyalgia    GERD (gastroesophageal reflux disease)    on meds   Glaucoma    Heart murmur    History of hiatal hernia    Hx of echocardiogram    Echo (03/2013):  Tech limited; Mild focal basal septal hypertrophy, EF 60-65%, normal RVF   Hyperlipidemia    Hypertension    Nonsustained ventricular tachycardia (HCC)    OSA on CPAP    Premature atrial contractions    PVC (premature ventricular contraction)    a. Holter 12/16: NSR, occ PAC,PVCs   Sarcoidosis    Type II diabetes mellitus ([place])    Past Surgical History:  Procedure Laterality Date   BREAST SURGERY     CARDIAC CATHETERIZATION  05/04/2007   reveals overall normal left ventricular systolic function. Ejection fraction 65-70%   CATARACT EXTRACTION W/PHACO Right 07/20/2013   Procedure: CATARACT EXTRACTION PHACO AND  INTRAOCULAR LENS PLACEMENT (IOC);  Surgeon: Marylynn Pearson, MD;  Location: [facility];  Service: Ophthalmology;  Laterality: Right;   COLONOSCOPY W/ BIOPSIES AND POLYPECTOMY     COLONOSCOPY WITH PROPOFOL N/A 09/07/2014   Procedure: COLONOSCOPY WITH PROPOFOL;  Surgeon: Juanita Craver, MD;  Location: [facility];  Service: Endoscopy;  Laterality: N/A;   COLONOSCOPY WITH PROPOFOL N/A 01/10/2020   Procedure: COLONOSCOPY WITH PROPOFOL;  Surgeon: Juanita Craver, MD;  Location: [facility];  Service: Endoscopy;  Laterality: N/A;   CORONARY ANGIOPLASTY WITH STENT PLACEMENT  01/15/2017   CORONARY ATHERECTOMY N/A 01/15/2017   Procedure: CORONARY ATHERECTOMY;  Surgeon: Nelva Bush, MD;  Location: [facility];  Service: Cardiovascular;  Laterality: N/A;   CORONARY BALLOON ANGIOPLASTY N/A 01/15/2017   Procedure: CORONARY BALLOON ANGIOPLASTY;  Surgeon: Nelva Bush, MD;  Location: [facility];  Service: Cardiovascular;  Laterality: N/A;   CORONARY STENT INTERVENTION N/A 01/15/2017   Procedure: CORONARY STENT INTERVENTION;  Surgeon: Nelva Bush, MD;  Location: [facility];  Service: Cardiovascular;  Laterality: N/A;   ESOPHAGOGASTRODUODENOSCOPY (EGD) WITH PROPOFOL N/A 09/07/2014   Procedure: ESOPHAGOGASTRODUODENOSCOPY (EGD) WITH PROPOFOL;  Surgeon: Juanita Craver, MD;  [facility];  Service: Endoscopy;  Laterality: N/A;   EXTERNAL EAR SURGERY Bilateral 1970s   tumors removed   EYE SURGERY Left 2019   cataract extraction    EYE SURGERY Right 02/09/2018   eyelid surgery    INTRAVASCULAR PRESSURE WIRE/FFR STUDY N/A 12/12/2016   Procedure: INTRAVASCULAR PRESSURE WIRE/FFR STUDY;  Surgeon: Nelva Bush, MD;  Location: [facility];  Service: Cardiovascular;  Laterality: N/A;   MINI SHUNT INSERTION Right 07/20/2013   Procedure: INSERTION OF GLAUCOMA FILTRATION DEVICE RIGHT EYE;  Surgeon: Marylynn Pearson, MD;  Location: [facility];  Service: Ophthalmology;  Laterality: Right;    MITOMYCIN C APPLICATION Right 4/40/1027   Procedure: MITOMYCIN C APPLICATION;  Surgeon: Marylynn Pearson, MD;  Location: [facility];  Service: Ophthalmology;  Laterality: Right;   MITOMYCIN C APPLICATION Right 2/53/6644   Procedure: MITOMYCIN C APPLICATION RIGHT EYE;  Surgeon: Marylynn Pearson, MD;  Location: [facility];  Service: Ophthalmology;  Laterality: Right;   PLACEMENT OF BREAST IMPLANTS Bilateral 1992   "took all my breast tissue out; put implants in;fibrocystic breast disease "   POLYPECTOMY  01/10/2020   Procedure: POLYPECTOMY;  Surgeon: Juanita Craver, MD;  [facility];  Service: Endoscopy;;   RIGHT/LEFT HEART CATH AND CORONARY ANGIOGRAPHY N/A 12/12/2016   Procedure: RIGHT/LEFT HEART CATH AND CORONARY ANGIOGRAPHY;  Surgeon: Nelva Bush, MD;  Location: [facility];  Service: Cardiovascular;  Laterality: N/A;   TONSILLECTOMY     TOTAL ABDOMINAL HYSTERECTOMY  1982   TRABECULECTOMY Right 02/21/2015   Procedure: TRABECULECTOMY WITH [facility] ON THE RIGHT EYE;  Surgeon: Marylynn Pearson, MD;  Location: [facility];  Service: Ophthalmology;  Laterality: Right;   Patient Active Problem List   Diagnosis Date Noted   IPF (idiopathic pulmonary fibrosis) ([place]) 08/20/2021   Urinary frequency 06/19/2021   Urinary tract infection without hematuria 06/19/2021   Imbalance 06/19/2021   Memory changes 06/19/2021   Drug therapy 06/19/2021   Back pain 05/24/2020   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 heart failure ([place]) 04/16/2020   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) ([place]) 04/13/2020   Chronic diastolic heart failure ([place]) 03/28/2020   Uncontrolled type 2 diabetes mellitus with hyperglycemia ([place]) 07/25/2019   Hiatal hernia 07/25/2019   Spontaneous ecchymoses 07/25/2019   Class 1 obesity due to excess calories with body mass index (BMI) of 31.0 to 31.9 in adult 07/25/2019   Peripheral vascular disease, unspecified ([place]) 03/23/2019   Uveitis 06/01/2017    Fibromyalgia 06/01/2017   DDD (degenerative disc disease), lumbar 06/01/2017   DDD (degenerative disc disease), thoracic 06/01/2017   History of hypercholesterolemia 06/01/2017   History of anxiety 06/01/2017   History of IBS 06/01/2017   Premature atrial contractions 03/30/2017   PVC's (premature ventricular contractions) 03/30/2017   Diabetes mellitus without complication ([place]) 03/47/4259   Angina pectoris ([place]) 01/15/2017   CAD (coronary artery disease) 01/12/2017   Shortness of breath 12/12/2016   Acute maxillary sinusitis 05/03/2014   Obstructive sleep apnea 11/04/2013   Multiple lung nodules 07/18/2012   Heart palpitations 03/11/2012   HTN (hypertension) 03/11/2012   Gastroesophageal reflux disease without esophagitis 07/13/2011   Sarcoidosis 02/08/2011   COPD with acute exacerbation ([place]) 02/05/2011   Hyperlipemia 09/24/2010   Atypical chest pain 09/24/2010   NSVT (nonsustained ventricular tachycardia) ([place]) 09/24/2010   Leg edema 09/24/2010    REFERRING DIAG: R26.89 (ICD-10-CM) - Imbalance   THERAPY DIAG:  Other abnormalities of gait and mobility  Unsteadiness on feet  Rationale for Evaluation and Treatment  Rehabilitation  PERTINENT HISTY: Sarcoidosis, headaches, NSVT,  HLD, leg edema, HTN, OSA, CAD, angina, DM2, PACs, PVCs, Fibromyalgia, PVD, chronic diastolic heart failure, recent diagnosis of pulmonary fibrosis  PRECAUTIONS: fall  SUBJECTIVE: No new complaints. No falls or pain to report.   PAIN:  Are you having pain? No   TODAY'S TREATMENT:   SELF CARE: Dicussed HEP. Has been doing some of them at home, not all of them consistently. Does find them still challenging when she does.  Fall prevention strategies discussed. Pt able to state several ways to prevent falls at home. Discussed other ways with handouts.   STRENGTHENING  5 time sit to stand: 19.25 sec's with no UE support from standard height chair.  2 minute walk test: Vitals before: BP  133/81, HR 82, SaO2 99%, no shortness of breath. Distance: 423 feet Comments: no AD used, CGA due to increased forward posture with toe scuffing as time/distance progressed.  Vitals after: BP 156/87, HR 85, SaO2 96%, mild shortness of breath.    GAIT: Gait pattern: step through pattern, decreased stride length, and trunk flexed Distance walked: around clinic with session Assistive device utilized: None Level of assistance: SBA and CGA Comments: occasional toe scuffing noted. Increased forward posture as pt fatigues.  Cognitive TUG: 19.47 sec's counting backward by 3 from #30  BALANCE/NMR: Standing on airex. CGA to min assist for balance with cues on posture and weight shifting to assist with balance recovery.  - feet together for EC 30 seconds x 3 reps - feet apart for EC head movements left<>right, up<>down for ~5 reps each       PATIENT EDUCATION: Education details: progress toward goals; continue with HEP and importance of ex's out of therapy for maximal gains Person educated: Patient Education method: Explanation Education comprehension: verbalized understanding     HOME EXERCISE PROGRAM: You Can Walk For A Certain Length Of Time Each Day                          Walk 5 minutes 2 times per day.             Increase 2  minutes every 3 days              Work up to 30 minutes (1-2 times per day).   Access Code: [email or web address] URL: [email or web address] Date: 10/02/2021 Prepared by: Estevan Ryder  Exercises - Sit to Stand with Counter Support  - 1 x daily - 7 x weekly - 3 sets - 10 reps - Standing March with Counter Support  - 1 x daily - 7 x weekly - 3 sets - 10 reps - Standing Heel Raises  - 1 x daily - 7 x weekly - 3 sets - 10 reps - Mini Squat with Counter Support  - 1 x daily - 7 x weekly - 3 sets - 10 reps - Narrow Stance with Counter Support  - 1 x daily - 7 x weekly - 3 sets - 10 reps - Semi-Tandem Balance at [employer] Eyes Open  - 1 x daily - 7 x  weekly - 3 sets - 10 reps        GOALS: Goals reviewed with patient? Yes   SHORT TERM GOALS: Target date: 10/18/2021   Pt will be independent with strength and balance HEP. Baseline:  10/16/21: has program, inconsistent with performance Goal status: PARTIALLY MET   2.  Pt will decrease 5xSTS to </=16 seconds w/o UE  support in order to demonstrate decreased risk for falls and improved functional bilateral LE strength and power. Baseline: 10/16/21: 19.25 sec's with no UE support from standard height surface, improved from 19.60 sec's just not to goal Goal status: PARTIALLY MET   3.  Pt will demonstrate CogTUG of </=20 seconds in order to decrease risk of falls and improve functional mobility using LRAD. Baseline: 10/16/21:19.47 sec's with no AD Goal status: MET   4.  Patient will improve 2MWT to >/= 323f with LRAD for improved community ambulation and endurance Baseline: 10/16/21: 423 feet no AD  Goal status: MET   5.  Pt will demonstrate teachback of fall prevention strategies and safety measures in the event of a fall in order to promote safety in home environment. Baseline:  10/16/21: met in session today Goal status: MET   LONG TERM GOALS: Target date: 11/15/2021   Pt will report compliance to walking program x3 days per week to promote aerobic gains and maintain LE strength gains outside clinic. Baseline: To be established. Goal status: INITIAL   2.  Pt will decrease 5xSTS to </=13 seconds w/o UE support in order to demonstrate decreased risk for falls and improved functional bilateral LE strength and power. Baseline: 19.60 sec w/ intermittent UE support Goal status: INITIAL   3.  Pt will demonstrate CogTUG of </=15 seconds in order to decrease risk of falls and improve functional mobility using LRAD. Baseline: 24.66 sec no AD Goal status: INITIAL   4.  Pt will demonstrate floor recovery using no more than minA to promote improved safety and independence in the event of a fall in  her home environment. Baseline: To be reviewed. Goal status: INITIAL   5.  Pt will improve FGA to >/= 20/30 to demonstrate improved balance and reduced fall risk  Baseline: 15/30 Goal status: INITIAL   ASSESSMENT:   CLINICAL IMPRESSION: Today's skilled session focused on progress toward STGs with goals partially to fully met. Remainder of session continued to focus on balance training with up to min assist/UE support needed for balance. The pt is making progress and should benefit from continued PT to progress toward unmet goals.    OBJECTIVE IMPAIRMENTS Abnormal gait, cardiopulmonary status limiting activity, decreased activity tolerance, decreased balance, decreased strength, increased edema, and postural dysfunction.    ACTIVITY LIMITATIONS lifting, bending, squatting, transfers, and locomotion level   PARTICIPATION LIMITATIONS: meal prep, laundry, and community activity   PERSONAL FACTORS Age, Fitness, Past/current experiences, Time since onset of injury/illness/exacerbation, and 3+ comorbidities: Sarcoidosis, pulmonary fibrosis, LE edema, chronic diastolic heart failure  are also affecting patient's functional outcome.    REHAB POTENTIAL: Good   CLINICAL DECISION MAKING: Evolving/moderate complexity   EVALUATION COMPLEXITY: Moderate   PLAN: PT FREQUENCY: 1x/week   PT DURATION: 8 weeks   PLANNED INTERVENTIONS: Therapeutic exercises, Therapeutic activity, Neuromuscular re-education, Balance training, Gait training, Patient/Family education, Self Care, Stair training, Vestibular training, DME instructions, and Re-evaluation   PLAN FOR NEXT SESSION:   for LE strength and balance, fall recovery/prevention strategies, ambulatory balance, compliant surfaces, NBOS/tandem, balance strategies      KWillow Ora PTA, CSanta Rosa Medical CenterOutpatient Neuro RSelect Specialty Hospital - Panama City[address] SClarksonGSalineno [place] [phone]/06/23, 6:44 PM

## 2021-10-17 ENCOUNTER — Other Ambulatory Visit: Payer: Self-pay

## 2021-10-18 ENCOUNTER — Other Ambulatory Visit: Payer: Self-pay

## 2021-10-18 DIAGNOSIS — R768 Other specified abnormal immunological findings in serum: Secondary | ICD-10-CM | POA: Insufficient documentation

## 2021-10-18 DIAGNOSIS — D649 Anemia, unspecified: Secondary | ICD-10-CM | POA: Insufficient documentation

## 2021-10-18 DIAGNOSIS — R0982 Postnasal drip: Secondary | ICD-10-CM | POA: Insufficient documentation

## 2021-10-18 DIAGNOSIS — G4733 Obstructive sleep apnea (adult) (pediatric): Secondary | ICD-10-CM | POA: Diagnosis not present

## 2021-10-18 DIAGNOSIS — Z6829 Body mass index (BMI) 29.0-29.9, adult: Secondary | ICD-10-CM | POA: Insufficient documentation

## 2021-10-18 MED ORDER — JANUMET 50-500 MG PO TABS: 1.0000 | ORAL_TABLET | Freq: Two times a day (BID) | ORAL | 1 refills | Status: DC

## 2021-10-18 MED ORDER — CETIRIZINE HCL 10 MG PO TABS: 10.0000 mg | ORAL_TABLET | Freq: Every day | ORAL | 2 refills | Status: DC

## 2021-10-21 ENCOUNTER — Ambulatory Visit (INDEPENDENT_AMBULATORY_CARE_PROVIDER_SITE_OTHER): Payer: Medicare Other

## 2021-10-21 VITALS — BP 130/86 | HR 79 | Temp 98.5°F | Ht 62.0 in | Wt 163.0 lb

## 2021-10-21 DIAGNOSIS — Z23 Encounter for immunization: Secondary | ICD-10-CM

## 2021-10-21 NOTE — Progress Notes (Signed)
Patient presents today for a flu shot. YL,RMA 

## 2021-10-23 ENCOUNTER — Ambulatory Visit: Payer: Medicare Other | Admitting: Physical Therapy

## 2021-10-23 DIAGNOSIS — M1990 Unspecified osteoarthritis, unspecified site: Secondary | ICD-10-CM | POA: Diagnosis not present

## 2021-10-23 DIAGNOSIS — L659 Nonscarring hair loss, unspecified: Secondary | ICD-10-CM | POA: Diagnosis not present

## 2021-10-23 DIAGNOSIS — R768 Other specified abnormal immunological findings in serum: Secondary | ICD-10-CM | POA: Diagnosis not present

## 2021-10-23 DIAGNOSIS — I890 Lymphedema, not elsewhere classified: Secondary | ICD-10-CM | POA: Diagnosis not present

## 2021-10-23 DIAGNOSIS — Z1159 Encounter for screening for other viral diseases: Secondary | ICD-10-CM | POA: Diagnosis not present

## 2021-10-23 DIAGNOSIS — M25562 Pain in left knee: Secondary | ICD-10-CM | POA: Diagnosis not present

## 2021-10-23 DIAGNOSIS — M79641 Pain in right hand: Secondary | ICD-10-CM | POA: Diagnosis not present

## 2021-10-23 DIAGNOSIS — M79671 Pain in right foot: Secondary | ICD-10-CM | POA: Diagnosis not present

## 2021-10-23 DIAGNOSIS — J8410 Pulmonary fibrosis, unspecified: Secondary | ICD-10-CM | POA: Diagnosis not present

## 2021-10-23 DIAGNOSIS — M2550 Pain in unspecified joint: Secondary | ICD-10-CM | POA: Diagnosis not present

## 2021-10-23 DIAGNOSIS — D869 Sarcoidosis, unspecified: Secondary | ICD-10-CM | POA: Diagnosis not present

## 2021-10-23 DIAGNOSIS — M79672 Pain in left foot: Secondary | ICD-10-CM | POA: Diagnosis not present

## 2021-10-23 DIAGNOSIS — M79642 Pain in left hand: Secondary | ICD-10-CM | POA: Diagnosis not present

## 2021-10-23 DIAGNOSIS — M25561 Pain in right knee: Secondary | ICD-10-CM | POA: Diagnosis not present

## 2021-10-23 LAB — COMPREHENSIVE METABOLIC PANEL
Albumin: 3.9 (ref 3.5–5.0)
Calcium: 8.8 (ref 8.7–10.7)

## 2021-10-23 LAB — CBC AND DIFFERENTIAL
HCT: 37 (ref 36–46)
Hemoglobin: 12 (ref 12.0–16.0)

## 2021-10-23 LAB — HEPATIC FUNCTION PANEL
ALT: 23 U/L (ref 7–35)
AST: 16 (ref 13–35)
Alkaline Phosphatase: 102 (ref 25–125)
Bilirubin, Total: 0.3

## 2021-10-23 LAB — BASIC METABOLIC PANEL
BUN: 11 (ref 4–21)
Chloride: 101 (ref 99–108)
Glucose: 107
Potassium: 4 mEq/L (ref 3.5–5.1)
Sodium: 137 (ref 137–147)

## 2021-10-23 LAB — CBC: RBC: 4.29 (ref 3.87–5.11)

## 2021-10-23 LAB — POCT ERYTHROCYTE SEDIMENTATION RATE, NON-AUTOMATED: Sed Rate: 5

## 2021-10-30 ENCOUNTER — Ambulatory Visit: Payer: Medicare Other | Admitting: Physical Therapy

## 2021-10-30 ENCOUNTER — Encounter: Payer: Self-pay | Admitting: Physical Therapy

## 2021-10-30 DIAGNOSIS — R2681 Unsteadiness on feet: Secondary | ICD-10-CM | POA: Diagnosis not present

## 2021-10-30 DIAGNOSIS — I890 Lymphedema, not elsewhere classified: Secondary | ICD-10-CM | POA: Diagnosis not present

## 2021-10-30 DIAGNOSIS — R2689 Other abnormalities of gait and mobility: Secondary | ICD-10-CM | POA: Diagnosis not present

## 2021-10-30 NOTE — Patient Instructions (Signed)
-   Supine Bridge  - 1 x daily - 7 x weekly - 2 sets - 12 reps

## 2021-10-30 NOTE — Therapy (Signed)
OUTPATIENT PHYSICAL THERAPY TREATMENT NOTE   Patient Name: Sheila Reynolds MRN: [id number] DOB:[birth year], 76 y.o., female Today's Date: 10/30/2021  PCP: Glendale Chard, MD REFERRING PROVIDER: Frann Rider, NP   END OF SESSION:   PT End of Session - 10/30/21 1030     Visit Number 5    Number of Visits 9    Date for PT Re-Evaluation 11/29/21    Authorization Type MEDICARE PART A AND B    PT Start Time 1025   PT ran over w/ eval prior   PT Stop Time 1100    PT Time Calculation (min) 35 min    Equipment Utilized During Treatment Gait belt    Activity Tolerance Patient tolerated treatment well    Behavior During Therapy WFL for tasks assessed/performed             Past Medical History:  Diagnosis Date   Anemia    3 months ago anemic   Anxiety    on meds   Arthritis    "all over" (01/15/2017)   Asthma    Bronchitis with emphysema    Chest pain    Chronic bronchitis ([place])    Coronary artery disease    a. 01/2017 she underwent orbital atherectomy/DES to the proxmal LAD and PTCA to ostial D2. 2D Echo 01/15/17 showed mild LVH, EF 60-65%, grade 1 DD.   Family history of anesthesia complication    daughter N/V   Fibromyalgia    GERD (gastroesophageal reflux disease)    on meds   Glaucoma    Heart murmur    History of hiatal hernia    Hx of echocardiogram    Echo (03/2013):  Tech limited; Mild focal basal septal hypertrophy, EF 60-65%, normal RVF   Hyperlipidemia    Hypertension    Nonsustained ventricular tachycardia (HCC)    OSA on CPAP    Premature atrial contractions    PVC (premature ventricular contraction)    a. Holter 12/16: NSR, occ PAC,PVCs   Sarcoidosis    Type II diabetes mellitus ([place])    Past Surgical History:  Procedure Laterality Date   BREAST SURGERY     CARDIAC CATHETERIZATION  05/04/2007   reveals overall normal left ventricular systolic function. Ejection fraction 65-70%   CATARACT EXTRACTION W/PHACO Right 07/20/2013   Procedure: CATARACT  EXTRACTION PHACO AND INTRAOCULAR LENS PLACEMENT (IOC);  Surgeon: Marylynn Pearson, MD;  Location: [place];  Service: Ophthalmology;  Laterality: Right;   COLONOSCOPY W/ BIOPSIES AND POLYPECTOMY     COLONOSCOPY WITH PROPOFOL N/A 09/07/2014   Procedure: COLONOSCOPY WITH PROPOFOL;  Surgeon: Juanita Craver, MD;  Location: [facility];  Service: Endoscopy;  Laterality: N/A;   COLONOSCOPY WITH PROPOFOL N/A 01/10/2020   Procedure: COLONOSCOPY WITH PROPOFOL;  Surgeon: Juanita Craver, MD;  Location: [facility];  Service: Endoscopy;  Laterality: N/A;   CORONARY ANGIOPLASTY WITH STENT PLACEMENT  01/15/2017   CORONARY ATHERECTOMY N/A 01/15/2017   Procedure: CORONARY ATHERECTOMY;  Surgeon: Nelva Bush, MD;  Location: [place] [facility];  Service: Cardiovascular;  Laterality: N/A;   CORONARY BALLOON ANGIOPLASTY N/A 01/15/2017   Procedure: CORONARY BALLOON ANGIOPLASTY;  Surgeon: Nelva Bush, MD;  Location: [place] [facility];  Service: Cardiovascular;  Laterality: N/A;   CORONARY STENT INTERVENTION N/A 01/15/2017   Procedure: CORONARY STENT INTERVENTION;  Surgeon: Nelva Bush, MD;  Location: [place] [facility];  Service: Cardiovascular;  Laterality: N/A;   ESOPHAGOGASTRODUODENOSCOPY (EGD) WITH PROPOFOL N/A 09/07/2014   Procedure: ESOPHAGOGASTRODUODENOSCOPY (EGD) WITH PROPOFOL;  Surgeon:  Juanita Craver, MD;  Location: [facility];  Service: Endoscopy;  Laterality: N/A;   EXTERNAL EAR SURGERY Bilateral 1970s   tumors removed   EYE SURGERY Left 2019   cataract extraction    EYE SURGERY Right 02/09/2018   eyelid surgery    INTRAVASCULAR PRESSURE WIRE/FFR STUDY N/A 12/12/2016   Procedure: INTRAVASCULAR PRESSURE WIRE/FFR STUDY;  Surgeon: Nelva Bush, MD;  Location: [place] [facility];  Service: Cardiovascular;  Laterality: N/A;   MINI SHUNT INSERTION Right 07/20/2013   Procedure: INSERTION OF GLAUCOMA FILTRATION DEVICE RIGHT EYE;  Surgeon: Marylynn Pearson, MD;  Location: [place];  Service: Ophthalmology;   Laterality: Right;   MITOMYCIN C APPLICATION Right 1/77/9390   Procedure: MITOMYCIN C APPLICATION;  Surgeon: Marylynn Pearson, MD;  Location: [place];  Service: Ophthalmology;  Laterality: Right;   MITOMYCIN C APPLICATION Right 3/00/9233   Procedure: MITOMYCIN C APPLICATION RIGHT EYE;  Surgeon: Marylynn Pearson, MD;  Location: [place];  Service: Ophthalmology;  Laterality: Right;   PLACEMENT OF BREAST IMPLANTS Bilateral 1992   "took all my breast tissue out; put implants in;fibrocystic breast disease "   POLYPECTOMY  01/10/2020   Procedure: POLYPECTOMY;  Surgeon: Juanita Craver, MD;  Location: [facility];  Service: Endoscopy;;   RIGHT/LEFT HEART CATH AND CORONARY ANGIOGRAPHY N/A 12/12/2016   Procedure: RIGHT/LEFT HEART CATH AND CORONARY ANGIOGRAPHY;  Surgeon: Nelva Bush, MD;  Location: [place] [facility];  Service: Cardiovascular;  Laterality: N/A;   TONSILLECTOMY     TOTAL ABDOMINAL HYSTERECTOMY  1982   TRABECULECTOMY Right 02/21/2015   Procedure: TRABECULECTOMY WITH [facility] ON THE RIGHT EYE;  Surgeon: Marylynn Pearson, MD;  Location: [place];  Service: Ophthalmology;  Laterality: Right;   Patient Active Problem List   Diagnosis Date Noted   Postnasal drip 10/18/2021   Low hemoglobin 10/18/2021   Positive ANA (antinuclear antibody) 10/18/2021   BMI 29.0-29.9,adult 10/18/2021   IPF (idiopathic pulmonary fibrosis) ([place]) 08/20/2021   Urinary frequency 06/19/2021   Urinary tract infection without hematuria 06/19/2021   Imbalance 06/19/2021   Memory changes 06/19/2021   Drug therapy 06/19/2021   Back pain 05/24/2020   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 heart failure ([place]) 04/16/2020   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) ([place]) 04/13/2020   Chronic diastolic heart failure ([place]) 03/28/2020   Uncontrolled type 2 diabetes mellitus with hyperglycemia ([place]) 07/25/2019   Hiatal hernia 07/25/2019   Spontaneous ecchymoses 07/25/2019   Class 1 obesity due  to excess calories with body mass index (BMI) of 31.0 to 31.9 in adult 07/25/2019   Peripheral vascular disease, unspecified ([place]) 03/23/2019   Uveitis 06/01/2017   Fibromyalgia 06/01/2017   DDD (degenerative disc disease), lumbar 06/01/2017   DDD (degenerative disc disease), thoracic 06/01/2017   History of hypercholesterolemia 06/01/2017   History of anxiety 06/01/2017   History of IBS 06/01/2017   Premature atrial contractions 03/30/2017   PVC's (premature ventricular contractions) 03/30/2017   Diabetes mellitus without complication ([place]) 00/76/2263   Angina pectoris ([place]) 01/15/2017   CAD (coronary artery disease) 01/12/2017   Shortness of breath 12/12/2016   Acute maxillary sinusitis 05/03/2014   Obstructive sleep apnea 11/04/2013   Multiple lung nodules 07/18/2012   Heart palpitations 03/11/2012   HTN (hypertension) 03/11/2012   Gastroesophageal reflux disease without esophagitis 07/13/2011   Sarcoidosis 02/08/2011   COPD with acute exacerbation ([place]) 02/05/2011   Hyperlipemia 09/24/2010   Atypical chest pain 09/24/2010   NSVT (nonsustained ventricular tachycardia) ([place]) 09/24/2010   Leg edema 09/24/2010  REFERRING DIAG: R26.89 (ICD-10-CM) - Imbalance   THERAPY DIAG:  Other abnormalities of gait and mobility  Unsteadiness on feet  Lymphedema, not elsewhere classified  Rationale for Evaluation and Treatment Rehabilitation  PERTINENT HISTY: Sarcoidosis, headaches, NSVT,  HLD, leg edema, HTN, OSA, CAD, angina, DM2, PACs, PVCs, Fibromyalgia, PVD, chronic diastolic heart failure, recent diagnosis of pulmonary fibrosis  PRECAUTIONS: fall  SUBJECTIVE: She fell yesterday reaching under a table to plug her charger in.  She fell backwards from a squat position and hit her back but not her head.  Her left elbow was scraped.  She initially could not get up for about 10 minutes, but was able to turn over onto her knees and was able to push up using a chair after several tries.   She expresses that she was going to call the FD if she had continued to be unsuccessful getting up.  She further states her HEP is so-so.  PAIN:  Are you having pain? No   TODAY'S TREATMENT:  Pt ambulates 230' w/ horizontal head turns, slow and cautious, no LOB 115' w/ head nods, mildly improved speed of movement, no LOB 100' w/ ball toss in limited ROM w/ pt having LOB x1 requiring minA to recover, pt denies dizziness, light-headedness, or pain, but endorses "off" feeling and motions to her head/neck STS w/ 5# kettlebell 2x10 Lateral stepping on foam beam 10x8' progressing to no UE support Supine bridges 2x12 - added to HEP   PATIENT EDUCATION: Education details:  Continue with HEP.  Reprinted HEP per pt request. Person educated: Patient Education method: Explanation Education comprehension: verbalized understanding     HOME EXERCISE PROGRAM: You Can Walk For A Certain Length Of Time Each Day                          Walk 5 minutes 2 times per day.             Increase 2  minutes every 3 days              Work up to 30 minutes (1-2 times per day).   Access Code: [email or web address] URL: [email or web address] Date: 10/02/2021 Prepared by: Estevan Ryder  Exercises - Sit to Stand with Counter Support  - 1 x daily - 7 x weekly - 3 sets - 10 reps - Standing March with Counter Support  - 1 x daily - 7 x weekly - 3 sets - 10 reps - Standing Heel Raises  - 1 x daily - 7 x weekly - 3 sets - 10 reps - Mini Squat with Counter Support  - 1 x daily - 7 x weekly - 3 sets - 10 reps - Narrow Stance with Counter Support  - 1 x daily - 7 x weekly - 3 sets - 10 reps - Semi-Tandem Balance at [employer] Eyes Open  - 1 x daily - 7 x weekly - 3 sets - 10 reps - Supine Bridge  - 1 x daily - 7 x weekly - 2 sets - 12 reps    PT reprints HEP 10/30/2021.  GOALS: Goals reviewed with patient? Yes   SHORT TERM GOALS: Target date: 10/18/2021   Pt will be independent with strength and balance  HEP. Baseline:  10/16/21: has program, inconsistent with performance Goal status: PARTIALLY MET   2.  Pt will decrease 5xSTS to </=16 seconds w/o UE support in order to demonstrate decreased risk for falls and improved functional bilateral  LE strength and power. Baseline: 10/16/21: 19.25 sec's with no UE support from standard height surface, improved from 19.60 sec's just not to goal Goal status: PARTIALLY MET   3.  Pt will demonstrate CogTUG of </=20 seconds in order to decrease risk of falls and improve functional mobility using LRAD. Baseline: 10/16/21:19.47 sec's with no AD Goal status: MET   4.  Patient will improve 2MWT to >/= 366f with LRAD for improved community ambulation and endurance Baseline: 10/16/21: 423 feet no AD  Goal status: MET   5.  Pt will demonstrate teachback of fall prevention strategies and safety measures in the event of a fall in order to promote safety in home environment. Baseline:  10/16/21: met in session today Goal status: MET   LONG TERM GOALS: Target date: 11/15/2021   Pt will report compliance to walking program x3 days per week to promote aerobic gains and maintain LE strength gains outside clinic. Baseline: To be established. Goal status: INITIAL   2.  Pt will decrease 5xSTS to </=13 seconds w/o UE support in order to demonstrate decreased risk for falls and improved functional bilateral LE strength and power. Baseline: 19.60 sec w/ intermittent UE support Goal status: INITIAL   3.  Pt will demonstrate CogTUG of </=15 seconds in order to decrease risk of falls and improve functional mobility using LRAD. Baseline: 24.66 sec no AD Goal status: INITIAL   4.  Pt will demonstrate floor recovery using no more than minA to promote improved safety and independence in the event of a fall in her home environment. Baseline: To be reviewed. Goal status: INITIAL   5.  Pt will improve FGA to >/= 20/30 to demonstrate improved balance and reduced fall  risk  Baseline: 15/30 Goal status: INITIAL   ASSESSMENT:   CLINICAL IMPRESSION: Pt tolerates skilled PT session well today with focus on dynamic balance task with eye tracking component added to head movements as these remain very challenging for patient.  She would benefit from establishment of walking program next session and discussion of benefit of re-cert vs discharge.  Will assess LTGs as appropriate and determine pt benefit from continued skilled care.   OBJECTIVE IMPAIRMENTS Abnormal gait, cardiopulmonary status limiting activity, decreased activity tolerance, decreased balance, decreased strength, increased edema, and postural dysfunction.    ACTIVITY LIMITATIONS lifting, bending, squatting, transfers, and locomotion level   PARTICIPATION LIMITATIONS: meal prep, laundry, and community activity   PERSONAL FACTORS Age, Fitness, Past/current experiences, Time since onset of injury/illness/exacerbation, and 3+ comorbidities: Sarcoidosis, pulmonary fibrosis, LE edema, chronic diastolic heart failure  are also affecting patient's functional outcome.    REHAB POTENTIAL: Good   CLINICAL DECISION MAKING: Evolving/moderate complexity   EVALUATION COMPLEXITY: Moderate   PLAN: PT FREQUENCY: 1x/week   PT DURATION: 8 weeks   PLANNED INTERVENTIONS: Therapeutic exercises, Therapeutic activity, Neuromuscular re-education, Balance training, Gait training, Patient/Family education, Self Care, Stair training, Vestibular training, DME instructions, and Re-evaluation   PLAN FOR NEXT SESSION:   Schedule 1 additional appt prior to goal assessment-discuss benefit of d/c vs re-cert and compliance to HEP and walking program at home.  Initiate walking program.  LE strength and balance, fall recovery/prevention strategies, ambulatory balance, compliant surfaces, tandem, balance strategies, head movements, eye tracking w/ high level balance tasks      MElease Etienne PT, DPT Outpatient Neuro  [facility] [place] [phone]/20/23, 11:12 AM

## 2021-11-04 DIAGNOSIS — E785 Hyperlipidemia, unspecified: Secondary | ICD-10-CM | POA: Diagnosis present

## 2021-11-04 DIAGNOSIS — R55 Syncope and collapse: Secondary | ICD-10-CM | POA: Diagnosis not present

## 2021-11-04 DIAGNOSIS — R4182 Altered mental status, unspecified: Secondary | ICD-10-CM | POA: Diagnosis not present

## 2021-11-04 DIAGNOSIS — Z90710 Acquired absence of both cervix and uterus: Secondary | ICD-10-CM | POA: Diagnosis not present

## 2021-11-04 DIAGNOSIS — R531 Weakness: Secondary | ICD-10-CM | POA: Diagnosis present

## 2021-11-04 DIAGNOSIS — I10 Essential (primary) hypertension: Secondary | ICD-10-CM | POA: Diagnosis not present

## 2021-11-04 DIAGNOSIS — Z794 Long term (current) use of insulin: Secondary | ICD-10-CM | POA: Diagnosis not present

## 2021-11-04 DIAGNOSIS — Z7902 Long term (current) use of antithrombotics/antiplatelets: Secondary | ICD-10-CM | POA: Diagnosis not present

## 2021-11-04 DIAGNOSIS — Z87891 Personal history of nicotine dependence: Secondary | ICD-10-CM | POA: Diagnosis not present

## 2021-11-04 DIAGNOSIS — R768 Other specified abnormal immunological findings in serum: Secondary | ICD-10-CM | POA: Diagnosis present

## 2021-11-04 DIAGNOSIS — J9811 Atelectasis: Secondary | ICD-10-CM | POA: Diagnosis not present

## 2021-11-04 DIAGNOSIS — Z888 Allergy status to other drugs, medicaments and biological substances status: Secondary | ICD-10-CM | POA: Diagnosis not present

## 2021-11-04 DIAGNOSIS — J984 Other disorders of lung: Secondary | ICD-10-CM | POA: Diagnosis not present

## 2021-11-04 DIAGNOSIS — R9082 White matter disease, unspecified: Secondary | ICD-10-CM | POA: Diagnosis not present

## 2021-11-04 DIAGNOSIS — H401113 Primary open-angle glaucoma, right eye, severe stage: Secondary | ICD-10-CM | POA: Diagnosis not present

## 2021-11-04 DIAGNOSIS — K219 Gastro-esophageal reflux disease without esophagitis: Secondary | ICD-10-CM | POA: Diagnosis present

## 2021-11-04 DIAGNOSIS — M1990 Unspecified osteoarthritis, unspecified site: Secondary | ICD-10-CM | POA: Diagnosis present

## 2021-11-04 DIAGNOSIS — E1142 Type 2 diabetes mellitus with diabetic polyneuropathy: Secondary | ICD-10-CM | POA: Diagnosis present

## 2021-11-04 DIAGNOSIS — Z9841 Cataract extraction status, right eye: Secondary | ICD-10-CM | POA: Diagnosis not present

## 2021-11-04 DIAGNOSIS — N390 Urinary tract infection, site not specified: Secondary | ICD-10-CM | POA: Diagnosis not present

## 2021-11-04 DIAGNOSIS — J449 Chronic obstructive pulmonary disease, unspecified: Secondary | ICD-10-CM | POA: Diagnosis present

## 2021-11-04 DIAGNOSIS — E119 Type 2 diabetes mellitus without complications: Secondary | ICD-10-CM | POA: Diagnosis not present

## 2021-11-04 DIAGNOSIS — I69398 Other sequelae of cerebral infarction: Secondary | ICD-10-CM | POA: Diagnosis not present

## 2021-11-04 DIAGNOSIS — Z20822 Contact with and (suspected) exposure to covid-19: Secondary | ICD-10-CM | POA: Diagnosis present

## 2021-11-04 DIAGNOSIS — J189 Pneumonia, unspecified organism: Secondary | ICD-10-CM | POA: Diagnosis present

## 2021-11-04 DIAGNOSIS — R5381 Other malaise: Secondary | ICD-10-CM | POA: Diagnosis not present

## 2021-11-04 DIAGNOSIS — G459 Transient cerebral ischemic attack, unspecified: Secondary | ICD-10-CM | POA: Diagnosis not present

## 2021-11-04 DIAGNOSIS — I6782 Cerebral ischemia: Secondary | ICD-10-CM | POA: Diagnosis not present

## 2021-11-04 DIAGNOSIS — J439 Emphysema, unspecified: Secondary | ICD-10-CM | POA: Diagnosis not present

## 2021-11-04 DIAGNOSIS — N3000 Acute cystitis without hematuria: Secondary | ICD-10-CM | POA: Diagnosis present

## 2021-11-04 DIAGNOSIS — R209 Unspecified disturbances of skin sensation: Secondary | ICD-10-CM | POA: Diagnosis not present

## 2021-11-04 DIAGNOSIS — Z7982 Long term (current) use of aspirin: Secondary | ICD-10-CM | POA: Diagnosis not present

## 2021-11-04 DIAGNOSIS — J410 Simple chronic bronchitis: Secondary | ICD-10-CM | POA: Diagnosis not present

## 2021-11-04 DIAGNOSIS — I2510 Atherosclerotic heart disease of native coronary artery without angina pectoris: Secondary | ICD-10-CM | POA: Diagnosis present

## 2021-11-04 DIAGNOSIS — H401122 Primary open-angle glaucoma, left eye, moderate stage: Secondary | ICD-10-CM | POA: Diagnosis present

## 2021-11-04 DIAGNOSIS — M79604 Pain in right leg: Secondary | ICD-10-CM | POA: Diagnosis not present

## 2021-11-04 DIAGNOSIS — Z7984 Long term (current) use of oral hypoglycemic drugs: Secondary | ICD-10-CM | POA: Diagnosis not present

## 2021-11-04 DIAGNOSIS — I5032 Chronic diastolic (congestive) heart failure: Secondary | ICD-10-CM | POA: Diagnosis present

## 2021-11-04 DIAGNOSIS — J479 Bronchiectasis, uncomplicated: Secondary | ICD-10-CM | POA: Diagnosis not present

## 2021-11-04 DIAGNOSIS — Z79899 Other long term (current) drug therapy: Secondary | ICD-10-CM | POA: Diagnosis not present

## 2021-11-04 DIAGNOSIS — J8410 Pulmonary fibrosis, unspecified: Secondary | ICD-10-CM | POA: Diagnosis present

## 2021-11-04 DIAGNOSIS — Z9013 Acquired absence of bilateral breasts and nipples: Secondary | ICD-10-CM | POA: Diagnosis not present

## 2021-11-04 DIAGNOSIS — I110 Hypertensive heart disease with heart failure: Secondary | ICD-10-CM | POA: Diagnosis present

## 2021-11-04 DIAGNOSIS — M79605 Pain in left leg: Secondary | ICD-10-CM | POA: Diagnosis not present

## 2021-11-05 DIAGNOSIS — E119 Type 2 diabetes mellitus without complications: Secondary | ICD-10-CM | POA: Diagnosis not present

## 2021-11-05 DIAGNOSIS — Z794 Long term (current) use of insulin: Secondary | ICD-10-CM | POA: Diagnosis not present

## 2021-11-05 DIAGNOSIS — R531 Weakness: Secondary | ICD-10-CM | POA: Diagnosis not present

## 2021-11-05 DIAGNOSIS — I10 Essential (primary) hypertension: Secondary | ICD-10-CM | POA: Diagnosis not present

## 2021-11-05 DIAGNOSIS — N3000 Acute cystitis without hematuria: Secondary | ICD-10-CM | POA: Diagnosis not present

## 2021-11-05 DIAGNOSIS — J984 Other disorders of lung: Secondary | ICD-10-CM | POA: Insufficient documentation

## 2021-11-05 DIAGNOSIS — J189 Pneumonia, unspecified organism: Secondary | ICD-10-CM | POA: Diagnosis not present

## 2021-11-05 DIAGNOSIS — E1169 Type 2 diabetes mellitus with other specified complication: Secondary | ICD-10-CM | POA: Insufficient documentation

## 2021-11-06 ENCOUNTER — Ambulatory Visit: Payer: Medicare Other | Admitting: Physical Therapy

## 2021-11-06 DIAGNOSIS — Z9841 Cataract extraction status, right eye: Secondary | ICD-10-CM | POA: Diagnosis not present

## 2021-11-06 DIAGNOSIS — Z794 Long term (current) use of insulin: Secondary | ICD-10-CM | POA: Diagnosis not present

## 2021-11-06 DIAGNOSIS — Z87891 Personal history of nicotine dependence: Secondary | ICD-10-CM | POA: Diagnosis not present

## 2021-11-06 DIAGNOSIS — Z7902 Long term (current) use of antithrombotics/antiplatelets: Secondary | ICD-10-CM | POA: Diagnosis not present

## 2021-11-06 DIAGNOSIS — Z20822 Contact with and (suspected) exposure to covid-19: Secondary | ICD-10-CM | POA: Diagnosis present

## 2021-11-06 DIAGNOSIS — Z7984 Long term (current) use of oral hypoglycemic drugs: Secondary | ICD-10-CM | POA: Diagnosis not present

## 2021-11-06 DIAGNOSIS — Z9013 Acquired absence of bilateral breasts and nipples: Secondary | ICD-10-CM | POA: Diagnosis not present

## 2021-11-06 DIAGNOSIS — Z7982 Long term (current) use of aspirin: Secondary | ICD-10-CM | POA: Diagnosis not present

## 2021-11-06 DIAGNOSIS — Z888 Allergy status to other drugs, medicaments and biological substances status: Secondary | ICD-10-CM | POA: Diagnosis not present

## 2021-11-06 DIAGNOSIS — N390 Urinary tract infection, site not specified: Secondary | ICD-10-CM | POA: Diagnosis not present

## 2021-11-06 DIAGNOSIS — R531 Weakness: Secondary | ICD-10-CM | POA: Diagnosis present

## 2021-11-06 DIAGNOSIS — E1142 Type 2 diabetes mellitus with diabetic polyneuropathy: Secondary | ICD-10-CM | POA: Diagnosis present

## 2021-11-06 DIAGNOSIS — I2510 Atherosclerotic heart disease of native coronary artery without angina pectoris: Secondary | ICD-10-CM | POA: Diagnosis present

## 2021-11-06 DIAGNOSIS — I110 Hypertensive heart disease with heart failure: Secondary | ICD-10-CM | POA: Diagnosis present

## 2021-11-06 DIAGNOSIS — R768 Other specified abnormal immunological findings in serum: Secondary | ICD-10-CM | POA: Diagnosis present

## 2021-11-06 DIAGNOSIS — M79604 Pain in right leg: Secondary | ICD-10-CM | POA: Diagnosis not present

## 2021-11-06 DIAGNOSIS — Z79899 Other long term (current) drug therapy: Secondary | ICD-10-CM | POA: Diagnosis not present

## 2021-11-06 DIAGNOSIS — E785 Hyperlipidemia, unspecified: Secondary | ICD-10-CM | POA: Diagnosis present

## 2021-11-06 DIAGNOSIS — I10 Essential (primary) hypertension: Secondary | ICD-10-CM | POA: Diagnosis not present

## 2021-11-06 DIAGNOSIS — M79605 Pain in left leg: Secondary | ICD-10-CM | POA: Diagnosis not present

## 2021-11-06 DIAGNOSIS — H401122 Primary open-angle glaucoma, left eye, moderate stage: Secondary | ICD-10-CM | POA: Diagnosis present

## 2021-11-06 DIAGNOSIS — M1990 Unspecified osteoarthritis, unspecified site: Secondary | ICD-10-CM | POA: Diagnosis present

## 2021-11-06 DIAGNOSIS — K219 Gastro-esophageal reflux disease without esophagitis: Secondary | ICD-10-CM | POA: Diagnosis present

## 2021-11-06 DIAGNOSIS — J189 Pneumonia, unspecified organism: Secondary | ICD-10-CM | POA: Diagnosis present

## 2021-11-06 DIAGNOSIS — I5032 Chronic diastolic (congestive) heart failure: Secondary | ICD-10-CM | POA: Diagnosis present

## 2021-11-06 DIAGNOSIS — N3000 Acute cystitis without hematuria: Secondary | ICD-10-CM | POA: Diagnosis present

## 2021-11-06 DIAGNOSIS — E119 Type 2 diabetes mellitus without complications: Secondary | ICD-10-CM | POA: Diagnosis not present

## 2021-11-06 DIAGNOSIS — J449 Chronic obstructive pulmonary disease, unspecified: Secondary | ICD-10-CM | POA: Diagnosis present

## 2021-11-06 DIAGNOSIS — Z90710 Acquired absence of both cervix and uterus: Secondary | ICD-10-CM | POA: Diagnosis not present

## 2021-11-06 DIAGNOSIS — J8410 Pulmonary fibrosis, unspecified: Secondary | ICD-10-CM | POA: Diagnosis present

## 2021-11-07 ENCOUNTER — Telehealth: Payer: Self-pay

## 2021-11-08 DIAGNOSIS — I5032 Chronic diastolic (congestive) heart failure: Secondary | ICD-10-CM | POA: Diagnosis not present

## 2021-11-08 DIAGNOSIS — Z8619 Personal history of other infectious and parasitic diseases: Secondary | ICD-10-CM | POA: Diagnosis not present

## 2021-11-08 DIAGNOSIS — J42 Unspecified chronic bronchitis: Secondary | ICD-10-CM | POA: Diagnosis not present

## 2021-11-08 DIAGNOSIS — Z8614 Personal history of Methicillin resistant Staphylococcus aureus infection: Secondary | ICD-10-CM | POA: Diagnosis not present

## 2021-11-08 DIAGNOSIS — I2510 Atherosclerotic heart disease of native coronary artery without angina pectoris: Secondary | ICD-10-CM | POA: Diagnosis not present

## 2021-11-08 DIAGNOSIS — E1142 Type 2 diabetes mellitus with diabetic polyneuropathy: Secondary | ICD-10-CM | POA: Diagnosis not present

## 2021-11-08 DIAGNOSIS — G4730 Sleep apnea, unspecified: Secondary | ICD-10-CM | POA: Diagnosis not present

## 2021-11-08 DIAGNOSIS — K219 Gastro-esophageal reflux disease without esophagitis: Secondary | ICD-10-CM | POA: Diagnosis not present

## 2021-11-08 DIAGNOSIS — I110 Hypertensive heart disease with heart failure: Secondary | ICD-10-CM | POA: Diagnosis not present

## 2021-11-08 DIAGNOSIS — J439 Emphysema, unspecified: Secondary | ICD-10-CM | POA: Diagnosis not present

## 2021-11-08 DIAGNOSIS — J45909 Unspecified asthma, uncomplicated: Secondary | ICD-10-CM | POA: Diagnosis not present

## 2021-11-08 DIAGNOSIS — Z9013 Acquired absence of bilateral breasts and nipples: Secondary | ICD-10-CM | POA: Diagnosis not present

## 2021-11-08 DIAGNOSIS — M1990 Unspecified osteoarthritis, unspecified site: Secondary | ICD-10-CM | POA: Diagnosis not present

## 2021-11-08 DIAGNOSIS — J8410 Pulmonary fibrosis, unspecified: Secondary | ICD-10-CM | POA: Diagnosis not present

## 2021-11-08 DIAGNOSIS — Z8701 Personal history of pneumonia (recurrent): Secondary | ICD-10-CM | POA: Diagnosis not present

## 2021-11-08 DIAGNOSIS — H9193 Unspecified hearing loss, bilateral: Secondary | ICD-10-CM | POA: Diagnosis not present

## 2021-11-08 DIAGNOSIS — Z7951 Long term (current) use of inhaled steroids: Secondary | ICD-10-CM | POA: Diagnosis not present

## 2021-11-08 DIAGNOSIS — E785 Hyperlipidemia, unspecified: Secondary | ICD-10-CM | POA: Diagnosis not present

## 2021-11-08 DIAGNOSIS — F32A Depression, unspecified: Secondary | ICD-10-CM | POA: Diagnosis not present

## 2021-11-08 DIAGNOSIS — D8689 Sarcoidosis of other sites: Secondary | ICD-10-CM | POA: Diagnosis not present

## 2021-11-08 DIAGNOSIS — N3000 Acute cystitis without hematuria: Secondary | ICD-10-CM | POA: Diagnosis not present

## 2021-11-08 DIAGNOSIS — Z87891 Personal history of nicotine dependence: Secondary | ICD-10-CM | POA: Diagnosis not present

## 2021-11-08 DIAGNOSIS — I341 Nonrheumatic mitral (valve) prolapse: Secondary | ICD-10-CM | POA: Diagnosis not present

## 2021-11-08 DIAGNOSIS — R002 Palpitations: Secondary | ICD-10-CM | POA: Diagnosis not present

## 2021-11-08 DIAGNOSIS — H401122 Primary open-angle glaucoma, left eye, moderate stage: Secondary | ICD-10-CM | POA: Diagnosis not present

## 2021-11-11 IMAGING — CT CT ANGIO CHEST
2 of 7 series · 17 of 46 positions shown · IV contrast (APPLIED)
Comparison: CT 04/30/2020 and previous

CLINICAL DATA: Interstitial lung disease, left chest pain, suspect
PE

EXAM:
CT ANGIOGRAPHY CHEST WITH CONTRAST
TECHNIQUE: Multidetector CT imaging of the chest was performed using the
standard protocol during bolus administration of intravenous
contrast. Multiplanar CT image reconstructions and MIPs were
obtained to evaluate the vascular anatomy.
CONTRAST:  80mL OMNIPAQUE IOHEXOL 350 MG/ML SOLN

[Series 6: thins · axial · 0.76mm/px · z∈[-217,+32]mm · 15 of 285 slices shown]
[im 18/285  lung]
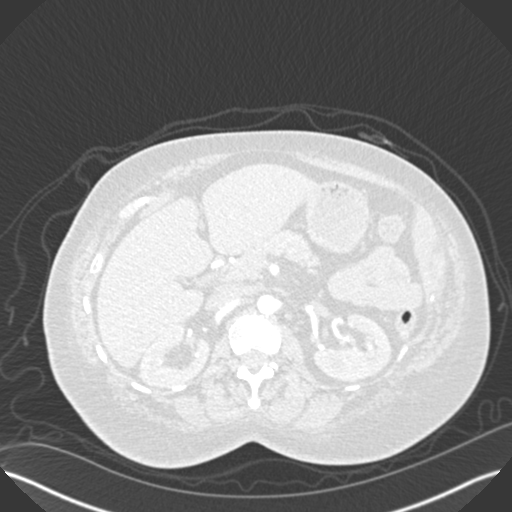
[im 36/285  soft-tissue]
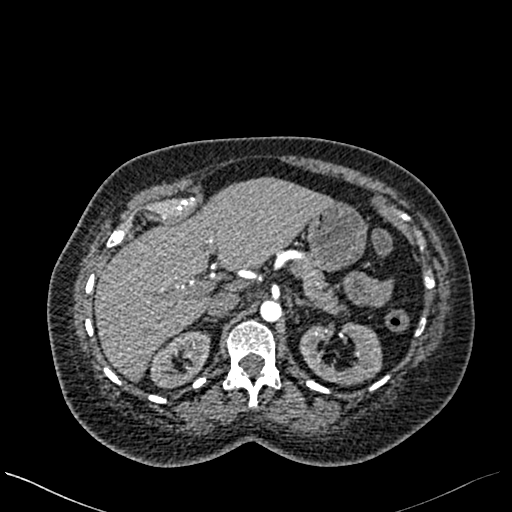
[im 54/285  lung]
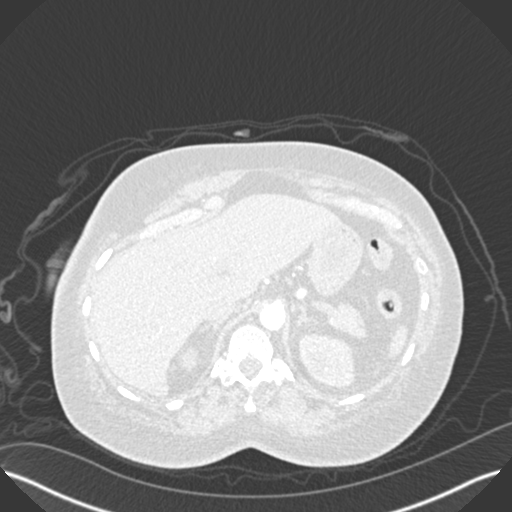
[im 72/285  soft-tissue]
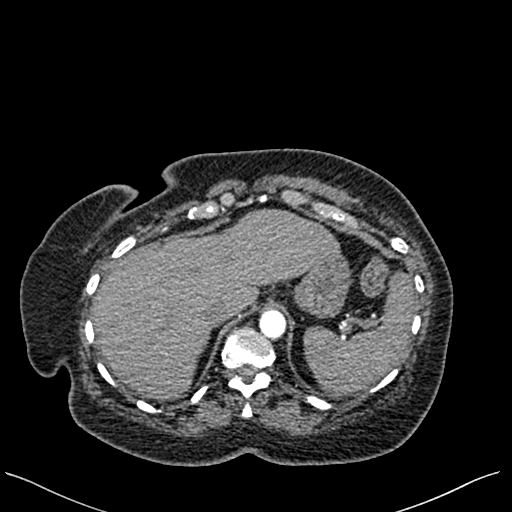
[im 89/285  lung]
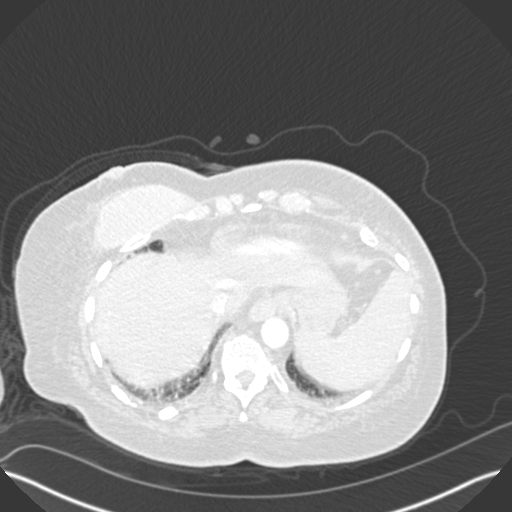
[im 107/285  soft-tissue]
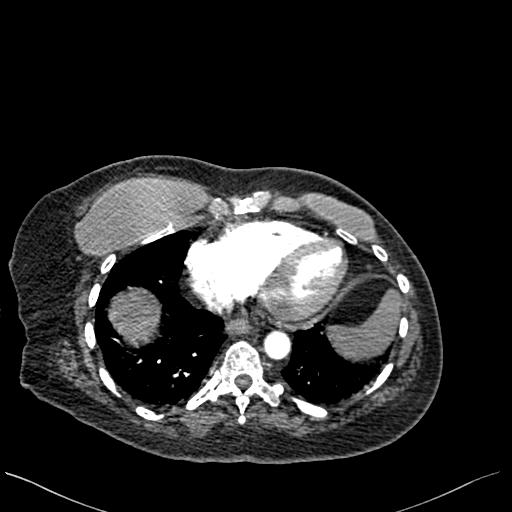
[im 125/285  lung]
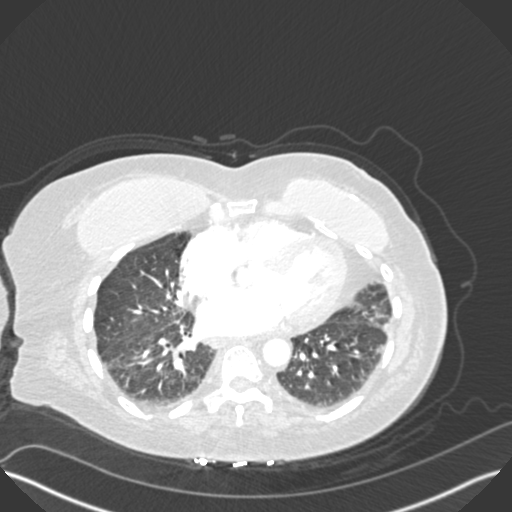
[im 143/285  soft-tissue]
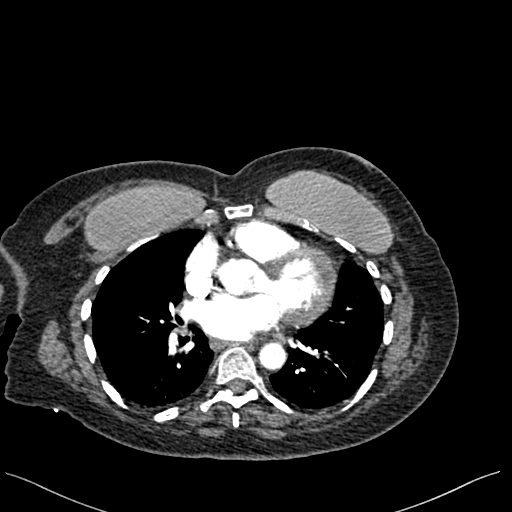
[im 160/285  lung]
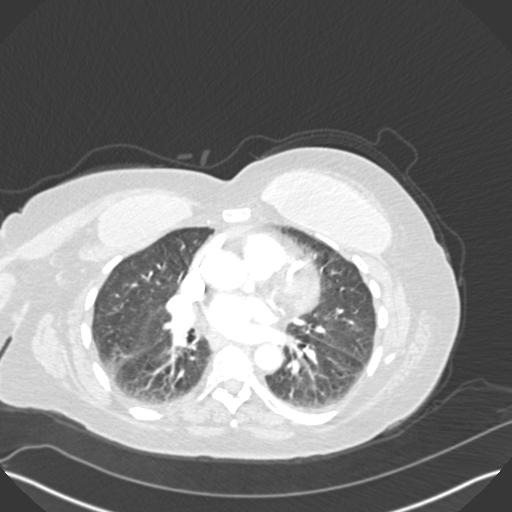
[im 178/285  soft-tissue]
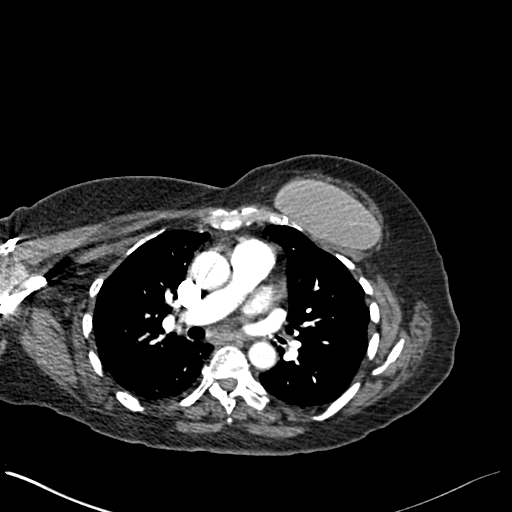
[im 196/285  lung]
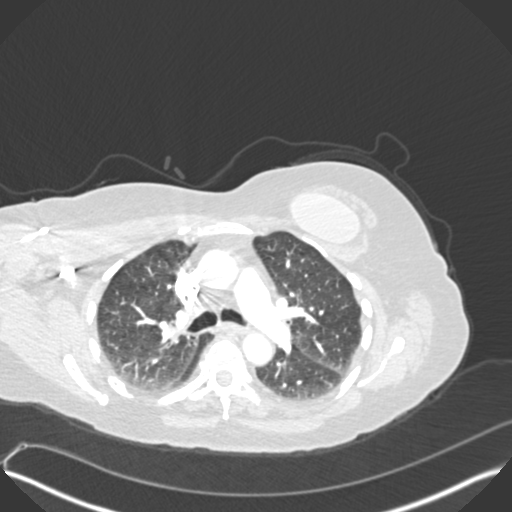
[im 214/285  soft-tissue]
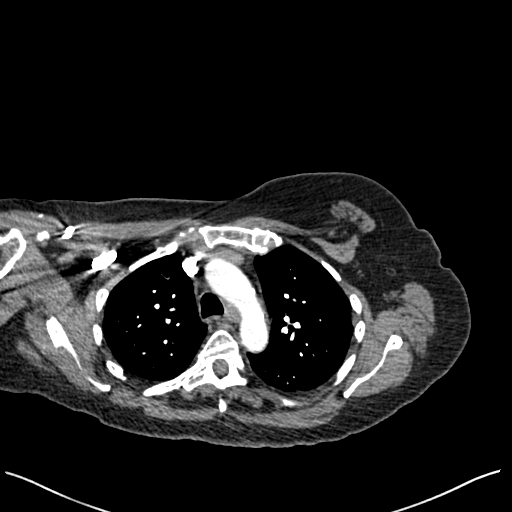
[im 231/285  lung]
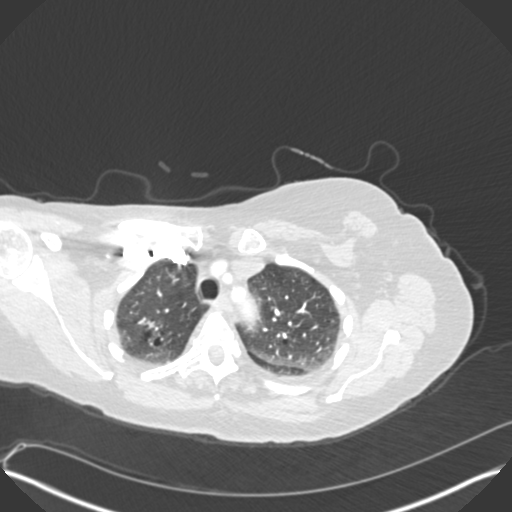
[im 249/285  soft-tissue]
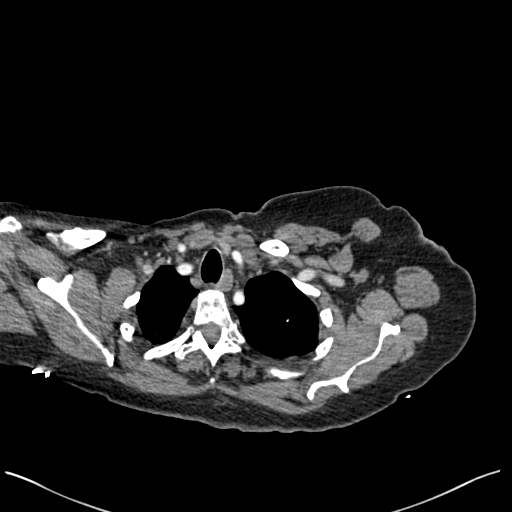
[im 267/285  lung]
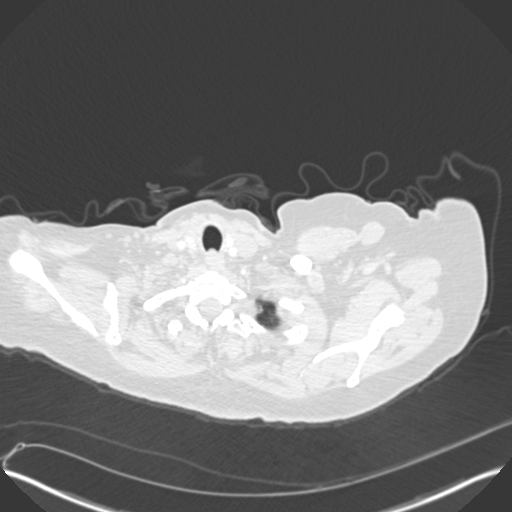

[Series 7: coronal mpr · coronal · 0.56mm/px · 2 of 86 slices shown]
[im 29/86  soft-tissue]
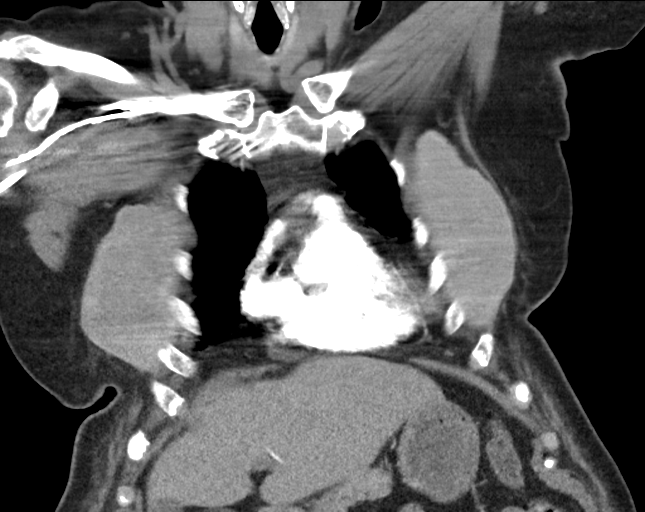
[im 57/86  soft-tissue]
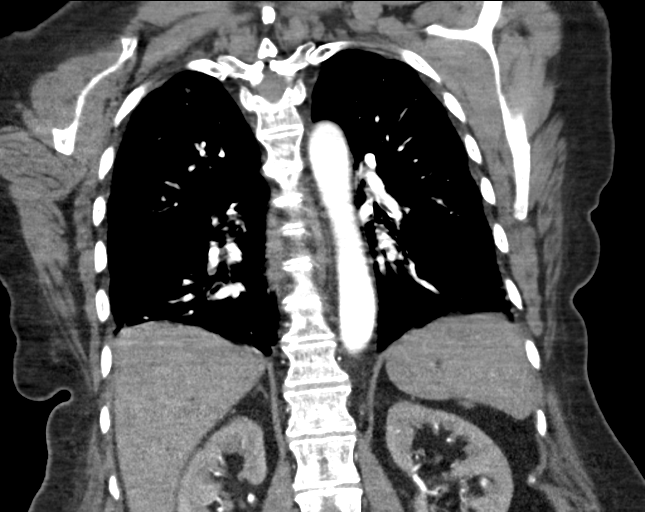

[17 of 46 positions shown; findings below may reference images not displayed]

FINDINGS: Cardiovascular: Heart size normal. No pericardial effusion.
Satisfactory opacification of pulmonary arteries noted, and there is
no evidence of pulmonary emboli. LAD stent. Adequate contrast
opacification of the thoracic aorta with no evidence of dissection,
aneurysm, or stenosis. There is classic 3-vessel brachiocephalic
arch anatomy without proximal stenosis. Scattered calcified plaque
in the distal arch and descending thoracic segment.

Mediastinum/Nodes: No  mediastinal hematoma.  No mass or adenopathy.

Lungs/Pleura: No pleural effusion. No pneumothorax. Progressive
nodularity in the posterior right lung apex, largest region
measuring 1.1 cm (previously 0.9) attributed to chronic mucoid
impaction.

Upper Abdomen: No acute findings.

Musculoskeletal: Bilateral breast implants. Stable presumed bone
islands in scapula, T4, T9, and T11 vertebral bodies. No fracture or
worrisome bone lesion.

Review of the MIP images confirms the above findings.
IMPRESSION: 1. Negative for acute PE or thoracic aortic dissection.
2. Coronary and Aortic Atherosclerosis ([id number]-170.0).
3. 11 mm right solid pulmonary nodule within the upper lobe.
Consider a non-contrast Chest CT at 3 months, a PET/CT, or tissue
sampling.

## 2021-11-13 DIAGNOSIS — I2510 Atherosclerotic heart disease of native coronary artery without angina pectoris: Secondary | ICD-10-CM | POA: Diagnosis not present

## 2021-11-13 DIAGNOSIS — N3000 Acute cystitis without hematuria: Secondary | ICD-10-CM | POA: Diagnosis not present

## 2021-11-13 DIAGNOSIS — D8689 Sarcoidosis of other sites: Secondary | ICD-10-CM | POA: Diagnosis not present

## 2021-11-13 DIAGNOSIS — I110 Hypertensive heart disease with heart failure: Secondary | ICD-10-CM | POA: Diagnosis not present

## 2021-11-13 DIAGNOSIS — E1142 Type 2 diabetes mellitus with diabetic polyneuropathy: Secondary | ICD-10-CM | POA: Diagnosis not present

## 2021-11-13 DIAGNOSIS — I5032 Chronic diastolic (congestive) heart failure: Secondary | ICD-10-CM | POA: Diagnosis not present

## 2021-11-14 ENCOUNTER — Telehealth: Payer: Self-pay

## 2021-11-14 NOTE — Telephone Encounter (Signed)
Patient called stating she had been waiting all day for a call from [place] with an appointment date and time. I reached back out to the patient and offered her an appointment for Tuesday at 3pm pt declined stating she has another appointment on that day. Patient then got upset and stated "this is so frustrating" I advised her that we have offered her 2 appointments and she declined the first appointment due to having physical therapy that same day and not being able to drive. She then stated that she would call around to see if would be able to make it we then called her and advised her that we would find her another appointment date and time so she would not have to rush to try to find a ride. Pt was ok with this. I advised her that I would give her a call with an appointment date and time after speaking with the provider due to the schedule needing to be overbooked. She then got an attitude with me and said "whatever yall have a nice weekend." [facility]

## 2021-11-15 DIAGNOSIS — I110 Hypertensive heart disease with heart failure: Secondary | ICD-10-CM | POA: Diagnosis not present

## 2021-11-15 DIAGNOSIS — E1142 Type 2 diabetes mellitus with diabetic polyneuropathy: Secondary | ICD-10-CM | POA: Diagnosis not present

## 2021-11-15 DIAGNOSIS — D8689 Sarcoidosis of other sites: Secondary | ICD-10-CM | POA: Diagnosis not present

## 2021-11-15 DIAGNOSIS — I5032 Chronic diastolic (congestive) heart failure: Secondary | ICD-10-CM | POA: Diagnosis not present

## 2021-11-15 DIAGNOSIS — N3000 Acute cystitis without hematuria: Secondary | ICD-10-CM | POA: Diagnosis not present

## 2021-11-15 DIAGNOSIS — I2510 Atherosclerotic heart disease of native coronary artery without angina pectoris: Secondary | ICD-10-CM | POA: Diagnosis not present

## 2021-11-18 DIAGNOSIS — I2510 Atherosclerotic heart disease of native coronary artery without angina pectoris: Secondary | ICD-10-CM | POA: Diagnosis not present

## 2021-11-18 DIAGNOSIS — E1142 Type 2 diabetes mellitus with diabetic polyneuropathy: Secondary | ICD-10-CM | POA: Diagnosis not present

## 2021-11-18 DIAGNOSIS — N3000 Acute cystitis without hematuria: Secondary | ICD-10-CM | POA: Diagnosis not present

## 2021-11-18 DIAGNOSIS — D8689 Sarcoidosis of other sites: Secondary | ICD-10-CM | POA: Diagnosis not present

## 2021-11-18 DIAGNOSIS — I5032 Chronic diastolic (congestive) heart failure: Secondary | ICD-10-CM | POA: Diagnosis not present

## 2021-11-18 DIAGNOSIS — I110 Hypertensive heart disease with heart failure: Secondary | ICD-10-CM | POA: Diagnosis not present

## 2021-11-19 ENCOUNTER — Ambulatory Visit (INDEPENDENT_AMBULATORY_CARE_PROVIDER_SITE_OTHER): Payer: Medicare Other | Admitting: Nurse Practitioner

## 2021-11-19 ENCOUNTER — Encounter: Payer: Self-pay | Admitting: Nurse Practitioner

## 2021-11-19 ENCOUNTER — Inpatient Hospital Stay: Payer: Self-pay | Admitting: Nurse Practitioner

## 2021-11-19 VITALS — BP 122/90 | HR 68 | Temp 98.1°F | Ht 62.0 in | Wt 168.6 lb

## 2021-11-19 DIAGNOSIS — D649 Anemia, unspecified: Secondary | ICD-10-CM | POA: Diagnosis not present

## 2021-11-19 DIAGNOSIS — Z09 Encounter for follow-up examination after completed treatment for conditions other than malignant neoplasm: Secondary | ICD-10-CM | POA: Diagnosis not present

## 2021-11-19 DIAGNOSIS — Z6829 Body mass index (BMI) 29.0-29.9, adult: Secondary | ICD-10-CM

## 2021-11-19 DIAGNOSIS — M2550 Pain in unspecified joint: Secondary | ICD-10-CM | POA: Diagnosis not present

## 2021-11-19 DIAGNOSIS — I110 Hypertensive heart disease with heart failure: Secondary | ICD-10-CM | POA: Diagnosis not present

## 2021-11-19 DIAGNOSIS — N3000 Acute cystitis without hematuria: Secondary | ICD-10-CM | POA: Diagnosis not present

## 2021-11-19 DIAGNOSIS — E1169 Type 2 diabetes mellitus with other specified complication: Secondary | ICD-10-CM | POA: Diagnosis not present

## 2021-11-19 DIAGNOSIS — E785 Hyperlipidemia, unspecified: Secondary | ICD-10-CM | POA: Diagnosis not present

## 2021-11-19 DIAGNOSIS — D8686 Sarcoid arthropathy: Secondary | ICD-10-CM | POA: Diagnosis not present

## 2021-11-19 DIAGNOSIS — J984 Other disorders of lung: Secondary | ICD-10-CM | POA: Diagnosis not present

## 2021-11-19 DIAGNOSIS — J8410 Pulmonary fibrosis, unspecified: Secondary | ICD-10-CM | POA: Diagnosis not present

## 2021-11-19 DIAGNOSIS — E1165 Type 2 diabetes mellitus with hyperglycemia: Secondary | ICD-10-CM | POA: Diagnosis not present

## 2021-11-19 DIAGNOSIS — R768 Other specified abnormal immunological findings in serum: Secondary | ICD-10-CM

## 2021-11-19 DIAGNOSIS — M0609 Rheumatoid arthritis without rheumatoid factor, multiple sites: Secondary | ICD-10-CM | POA: Diagnosis not present

## 2021-11-19 DIAGNOSIS — I5032 Chronic diastolic (congestive) heart failure: Secondary | ICD-10-CM | POA: Diagnosis not present

## 2021-11-19 DIAGNOSIS — I890 Lymphedema, not elsewhere classified: Secondary | ICD-10-CM | POA: Diagnosis not present

## 2021-11-19 DIAGNOSIS — M1990 Unspecified osteoarthritis, unspecified site: Secondary | ICD-10-CM | POA: Diagnosis not present

## 2021-11-19 DIAGNOSIS — D869 Sarcoidosis, unspecified: Secondary | ICD-10-CM | POA: Diagnosis not present

## 2021-11-19 NOTE — Progress Notes (Signed)
I,Victoria T Hamilton,acting as a Education administrator for [name], FNP.,have documented all relevant documentation on the behalf of [name], FNP,as directed by  [name], FNP while in the presence of [name], [place].    Subjective:     Patient ID: Sheila Reynolds , female    DOB: Jul 24, 1945 , 76 y.o.   MRN: [id number]   Chief Complaint  Patient presents with   Hospitalization Follow-up    HPI  Pt presents today for hospital follow up admission at [facility] while at the ophthalmologist [facility]) she had blurred vision and did not feel well.   She was admitted to [facility] on 9/25 discharged on Wednesday  11/06/2021. She is here today with her husband. Patient adds, she was doing an eye test[facility], her bp shot up at the office. She reports "dozing off"/ passing out.  She was admitted for dehydration, infection in lugs & UTI.  Patient currently does not drive.   She has PT through [facility] - she will walk up the driveway and come back, stand and march in place, kick her legs out, sit up and down. 2-3 days a week, will stay 20-30 minutes. She was sent home with a walker. She has a cane that she is using. Her weakness is a little better at times. Blurred vision comes and goes.   She is due to see her Pulmonolgist on October 28th for the fibrosis. Dr. Theone Stanley [facility]).   Now feels weak and "ancy". When out she is having some shortness of breath. Sometimes she feels like she needs to lay down. She was feeling fine before her eye exam. Her blood pressure started going up during her exam due to being nervous. During her field vision exam everything went "haywire"   Denies blood in stool.      Past Medical History:  Diagnosis Date   Anemia    3 months ago anemic   Anxiety    on meds   Arthritis    "all over" (01/15/2017)   Asthma    Bronchitis with emphysema    Chest pain    Chronic bronchitis ([place])    Coronary artery disease    a. 01/2017 she underwent orbital  atherectomy/DES to the proxmal LAD and PTCA to ostial D2. 2D Echo 01/15/17 showed mild LVH, EF 60-65%, grade 1 DD.   Family history of anesthesia complication    daughter N/V   Fibromyalgia    GERD (gastroesophageal reflux disease)    on meds   Glaucoma    Heart murmur    History of hiatal hernia    Hx of echocardiogram    Echo (03/2013):  Tech limited; Mild focal basal septal hypertrophy, EF 60-65%, normal RVF   Hyperlipidemia    Hypertension    Nonsustained ventricular tachycardia (HCC)    OSA on CPAP    Premature atrial contractions    PVC (premature ventricular contraction)    a. Holter 12/16: NSR, occ PAC,PVCs   Sarcoidosis    Type II diabetes mellitus ([place])      Family History  Problem Relation Age of Onset   Heart disease Father    Diabetes Father    Glaucoma Father    Cancer Mother        unknown type, ?lung   Heart disease Paternal Grandmother    Cancer Paternal Grandmother        unknown type   Diabetes Brother    Glaucoma Brother  Current Outpatient Medications:    acetaminophen (TYLENOL) 500 MG tablet, Take 1,000 mg by mouth 2 (two) times daily as needed for moderate pain or headache., Disp: , Rfl:    albuterol (PROVENTIL) (2.5 MG/3ML) 0.083% nebulizer solution, Take 3 mLs (2.5 mg total) by nebulization daily as needed for wheezing or shortness of breath., Disp: 75 mL, Rfl: 12   albuterol (VENTOLIN HFA) 108 (90 Base) MCG/ACT inhaler, Inhale 2 puffs into the lungs every 6 (six) hours as needed for wheezing or shortness of breath., Disp: 18 g, Rfl: 12   aspirin EC 81 MG tablet, Take 81 mg by mouth daily. Swallow whole., Disp: , Rfl:    atorvastatin (LIPITOR) 80 MG tablet, Take 1 tablet (80 mg total) by mouth daily., Disp: 90 tablet, Rfl: 3   azelastine (ASTELIN) 0.1 % nasal spray, Place 1 spray into both nostrils 2 (two) times daily. Use in each nostril as directed, Disp: 30 mL, Rfl: 1   benzonatate (TESSALON) 200 MG capsule, Take 1 capsule (200 mg total) by  mouth 3 (three) times daily as needed for cough., Disp: 30 capsule, Rfl: 2   Blood Glucose Monitoring Suppl (ACCU-CHEK AVIVA PLUS) w/Device KIT, Use to check blood sugars 3 times a day. Dx code e11.65, Disp: 1 kit, Rfl: 3   brimonidine (ALPHAGAN P) 0.1 % SOLN, Place 1 drop into both eyes 2 (two) times daily. , Disp: , Rfl:    brinzolamide (AZOPT) 1 % ophthalmic suspension, Place 1 drop into both eyes 3 (three) times daily., Disp: , Rfl:    carvedilol (COREG) 12.5 MG tablet, TAKE 1 TABLET(12.5 MG) BY MOUTH TWICE DAILY, Disp: 60 tablet, Rfl: 0   cetirizine (ZYRTEC ALLERGY) 10 MG tablet, Take 1 tablet (10 mg total) by mouth daily., Disp: 30 tablet, Rfl: 2   clopidogrel (PLAVIX) 75 MG tablet, TAKE 1 TABLET(75 MG) BY MOUTH DAILY, Disp: 90 tablet, Rfl: 3   EPINEPHrine 0.3 mg/0.3 mL IJ SOAJ injection, Inject 0.3 mg into the muscle as needed for anaphylaxis., Disp: 1 each, Rfl: 0   ezetimibe (ZETIA) 10 MG tablet, Take 1 tablet (10 mg total) by mouth daily., Disp: 90 tablet, Rfl: 3   furosemide (LASIX) 40 MG tablet, Take 1 tablet (40 mg total) by mouth every other day., Disp: 90 tablet, Rfl: 1   isosorbide mononitrate (IMDUR) 60 MG 24 hr tablet, TAKE 1 TABLET(60 MG) BY MOUTH DAILY, Disp: 90 tablet, Rfl: 3   ketorolac (ACULAR) 0.5 % ophthalmic solution, Place 1 drop into the right eye 3 (three) times daily., Disp: , Rfl:    Lancets (ONETOUCH DELICA PLUS [id number]) MISC, CHECK BLOOD SUGAR BEFORE BREAKFAST AND DINNER, Disp: 100 each, Rfl: 1   montelukast (SINGULAIR) 10 MG tablet, TAKE 1 TABLET BY MOUTH EVERY EVENING, Disp: 90 tablet, Rfl: 1   Nebulizers (COMPRESSOR/NEBULIZER) MISC, Use as directed, Disp: 1 each, Rfl: 0   pantoprazole (PROTONIX) 40 MG tablet, TAKE 1 TABLET(40 MG) BY MOUTH DAILY, Disp: 90 tablet, Rfl: 3   Polyvinyl Alcohol-Povidone PF (REFRESH) 1.4-0.6 % SOLN, Place 1-2 drops into both eyes 3 (three) times daily as needed (dry/irritated eyes.)., Disp: , Rfl:    potassium chloride SA (KLOR-CON) 20  MEQ tablet, Take 1 tablet (20 mEq total) by mouth every other day., Disp: 90 tablet, Rfl: 1   prednisoLONE acetate (PRED FORTE) 1 % ophthalmic suspension, Apply to eye., Disp: , Rfl:    pregabalin (LYRICA) 75 MG capsule, TAKE 1 CAPSULE(75 MG) BY MOUTH TWICE DAILY, Disp: 180 capsule, Rfl:  2   RHOPRESSA 0.02 % SOLN, Place 1 drop into the left eye at bedtime., Disp: , Rfl:    sitaGLIPtin-metformin (JANUMET) 50-500 MG tablet, Take 1 tablet by mouth 2 (two) times daily with a meal., Disp: 90 tablet, Rfl: 1   umeclidinium-vilanterol (ANORO ELLIPTA) 62.5-25 MCG/ACT AEPB, Inhale 1 puff into the lungs daily at 6 (six) AM., Disp: 1 each, Rfl: 12   azaTHIOprine (IMURAN) 50 MG tablet, Take by mouth., Disp: , Rfl:    nitroGLYCERIN (NITROSTAT) 0.4 MG SL tablet, Place 1 tablet (0.4 mg total) under the tongue every 5 (five) minutes as needed for chest pain., Disp: 25 tablet, Rfl: prn   Travoprost, BAK Free, (TRAVATAN) 0.004 % SOLN ophthalmic solution, Place 1 drop into the right eye at bedtime., Disp: , Rfl:    Allergies  Allergen Reactions   Crestor [Rosuvastatin Calcium] Other (See Comments)    muscle aches   Demerol  [Meperidine Hcl]     Other reaction(s): Hallucinations   Shellfish Allergy Itching and Other (See Comments)    Crab, shrimp and lobster ---lips itch and tingle Was told not to eat again after having a allergy test. Lobster, crab and shrimp      Review of Systems  Constitutional: Negative.   Respiratory: Negative.    Cardiovascular: Negative.   Neurological: Negative.   Psychiatric/Behavioral: Negative.       Today's Vitals   11/19/21 0949  BP: (!) 122/90  Pulse: 68  Temp: 98.1 F (36.7 C)  SpO2: 98%  Weight: 168 lb 9.6 oz (76.5 kg)  Height: 5' 2"  (1.575 m)  PainSc: 0-No pain   Body mass index is 30.84 kg/m.  Wt Readings from Last 3 Encounters:  11/27/21 165 lb 12.8 oz (75.2 kg)  11/19/21 168 lb 9.6 oz (76.5 kg)  10/21/21 163 lb (73.9 kg)    Objective:  Physical  Exam Vitals reviewed.  Constitutional:      General: She is not in acute distress.    Appearance: Normal appearance. She is well-developed. She is obese.  Cardiovascular:     Rate and Rhythm: Normal rate and regular rhythm.     Pulses: Normal pulses.     Heart sounds: Normal heart sounds. No murmur heard. Pulmonary:     Effort: Pulmonary effort is normal. No respiratory distress.     Breath sounds: No wheezing.     Comments: Diminished breath sounds bilateral bases. Chest:     Chest wall: No tenderness.  Musculoskeletal:        General: Normal range of motion.  Skin:    General: Skin is warm and dry.     Capillary Refill: Capillary refill takes less than 2 seconds.  Neurological:     General: No focal deficit present.     Mental Status: She is alert and oriented to person, place, and time.     Cranial Nerves: No cranial nerve deficit.     Motor: No weakness.  Psychiatric:        Mood and Affect: Mood normal.        Behavior: Behavior normal.        Thought Content: Thought content normal.        Judgment: Judgment normal.         Assessment And Plan:     1. Pneumonitis Comments: Order placed for repeat CXR - she has appt with Pulmonology advised to check about CXR.  - DG Chest 2 View; Future  2. Acute cystitis without hematuria Comments:  will check repeat urine culture - Culture, Urine  3. Dyslipidemia associated with type 2 diabetes mellitus ([place]) Comments: Continue current medications  4. 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 heart failure ([place]) Comments: Blood pressure is slightly elevated. She had not taken her blood pressure medications prior to her visit.   5. Uncontrolled type 2 diabetes mellitus with hyperglycemia (HCC) Comments: Continue medications. HgbA1c is slightly improved. Continue focusing on healthy diet low in sugar and starches  6. Low hemoglobin Comments: Gave stool cards to return due to Hgb 11.3  7. BMI  29.0-29.9,adult  8. Hospital discharge follow-up     Patient was given opportunity to ask questions. Patient verbalized understanding of the plan and was able to repeat key elements of the plan. All questions were answered to their satisfaction.  [name], FNP   I, [name], FNP, have reviewed all documentation for this visit. The documentation on 11/19/21 for the exam, diagnosis, procedures, and orders are all accurate and complete.   IF YOU HAVE BEEN REFERRED TO A SPECIALIST, IT MAY TAKE 1-2 WEEKS TO SCHEDULE/PROCESS THE REFERRAL. IF YOU HAVE NOT HEARD FROM US/SPECIALIST IN TWO WEEKS, PLEASE GIVE [place] A CALL AT [phone].   THE PATIENT IS ENCOURAGED TO PRACTICE SOCIAL DISTANCING DUE TO THE COVID-19 PANDEMIC.

## 2021-11-21 DIAGNOSIS — I5032 Chronic diastolic (congestive) heart failure: Secondary | ICD-10-CM | POA: Diagnosis not present

## 2021-11-21 DIAGNOSIS — N3000 Acute cystitis without hematuria: Secondary | ICD-10-CM | POA: Diagnosis not present

## 2021-11-21 DIAGNOSIS — E1142 Type 2 diabetes mellitus with diabetic polyneuropathy: Secondary | ICD-10-CM | POA: Diagnosis not present

## 2021-11-21 DIAGNOSIS — D8689 Sarcoidosis of other sites: Secondary | ICD-10-CM | POA: Diagnosis not present

## 2021-11-21 DIAGNOSIS — I110 Hypertensive heart disease with heart failure: Secondary | ICD-10-CM | POA: Diagnosis not present

## 2021-11-21 DIAGNOSIS — I2510 Atherosclerotic heart disease of native coronary artery without angina pectoris: Secondary | ICD-10-CM | POA: Diagnosis not present

## 2021-11-21 LAB — URINE CULTURE

## 2021-11-25 ENCOUNTER — Encounter: Payer: Self-pay | Admitting: Internal Medicine

## 2021-11-25 DIAGNOSIS — I5032 Chronic diastolic (congestive) heart failure: Secondary | ICD-10-CM | POA: Diagnosis not present

## 2021-11-25 DIAGNOSIS — I110 Hypertensive heart disease with heart failure: Secondary | ICD-10-CM | POA: Diagnosis not present

## 2021-11-25 DIAGNOSIS — N3000 Acute cystitis without hematuria: Secondary | ICD-10-CM | POA: Diagnosis not present

## 2021-11-25 DIAGNOSIS — I2510 Atherosclerotic heart disease of native coronary artery without angina pectoris: Secondary | ICD-10-CM | POA: Diagnosis not present

## 2021-11-25 DIAGNOSIS — D8689 Sarcoidosis of other sites: Secondary | ICD-10-CM | POA: Diagnosis not present

## 2021-11-25 DIAGNOSIS — E1142 Type 2 diabetes mellitus with diabetic polyneuropathy: Secondary | ICD-10-CM | POA: Diagnosis not present

## 2021-11-26 ENCOUNTER — Other Ambulatory Visit (INDEPENDENT_AMBULATORY_CARE_PROVIDER_SITE_OTHER): Payer: Medicare Other

## 2021-11-26 DIAGNOSIS — D649 Anemia, unspecified: Secondary | ICD-10-CM | POA: Diagnosis not present

## 2021-11-26 LAB — POC HEMOCCULT BLD/STL (OFFICE/1-CARD/DIAGNOSTIC): Fecal Occult Blood, POC: NEGATIVE

## 2021-11-27 ENCOUNTER — Ambulatory Visit (INDEPENDENT_AMBULATORY_CARE_PROVIDER_SITE_OTHER): Payer: Medicare Other | Admitting: Pulmonary Disease

## 2021-11-27 ENCOUNTER — Encounter: Payer: Self-pay | Admitting: Pulmonary Disease

## 2021-11-27 VITALS — BP 144/70 | HR 62 | Temp 97.9°F | Ht 62.0 in | Wt 165.8 lb

## 2021-11-27 DIAGNOSIS — J849 Interstitial pulmonary disease, unspecified: Secondary | ICD-10-CM

## 2021-11-27 DIAGNOSIS — D869 Sarcoidosis, unspecified: Secondary | ICD-10-CM

## 2021-11-27 DIAGNOSIS — I25119 Atherosclerotic heart disease of native coronary artery with unspecified angina pectoris: Secondary | ICD-10-CM | POA: Diagnosis not present

## 2021-11-27 LAB — PULMONARY FUNCTION TEST
DL/VA % pred: 87 %
DL/VA: 3.64 ml/min/mmHg/L
DLCO cor % pred: 57 %
DLCO cor: 10.38 ml/min/mmHg
DLCO unc % pred: 53 %
DLCO unc: 9.64 ml/min/mmHg
FEF 25-75 Post: 1.04 L/sec
FEF 25-75 Pre: 1.05 L/sec
FEF2575-%Change-Post: 0 %
FEF2575-%Pred-Post: 65 %
FEF2575-%Pred-Pre: 65 %
FEV1-%Change-Post: 0 %
FEV1-%Pred-Post: 65 %
FEV1-%Pred-Pre: 66 %
FEV1-Post: 1.28 L
FEV1-Pre: 1.29 L
FEV1FVC-%Change-Post: 3 %
FEV1FVC-%Pred-Pre: 103 %
FEV6-%Change-Post: -4 %
FEV6-%Pred-Post: 64 %
FEV6-%Pred-Pre: 67 %
FEV6-Post: 1.6 L
FEV6-Pre: 1.67 L
FEV6FVC-%Pred-Post: 105 %
FEV6FVC-%Pred-Pre: 105 %
FVC-%Change-Post: -4 %
FVC-%Pred-Post: 61 %
FVC-%Pred-Pre: 63 %
FVC-Post: 1.6 L
FVC-Pre: 1.67 L
Post FEV1/FVC ratio: 80 %
Post FEV6/FVC ratio: 100 %
Pre FEV1/FVC ratio: 77 %
Pre FEV6/FVC Ratio: 100 %
RV % pred: 71 %
RV: 1.55 L
TLC % pred: 68 %
TLC: 3.24 L

## 2021-11-27 NOTE — Patient Instructions (Signed)
Full PFT Performed Today  

## 2021-11-27 NOTE — Progress Notes (Signed)
Full PFT Performed Today  

## 2021-11-27 NOTE — Progress Notes (Signed)
SADIRA STANDARD    [id number]    1945/02/19  Primary Care Physician:Sanders, Bailey Mech, MD  Referring Physician: Glendale Chard, MD [address] [place],  [place] [id number]  Chief complaint: Follow-up for interstitial lung disease  HPI: 76 y.o. who  has a past medical history of Anemia, Anxiety, Arthritis, Asthma, Bronchitis with emphysema, Chest pain, Chronic bronchitis ([place]), Coronary artery disease, Family history of anesthesia complication, Fibromyalgia, GERD (gastroesophageal reflux disease), Glaucoma, Heart murmur, History of hiatal hernia, echocardiogram, Hyperlipidemia, Hypertension, Nonsustained ventricular tachycardia ([place]), OSA on CPAP, Premature atrial contractions, PVC (premature ventricular contraction), Sarcoidosis, and Type II diabetes mellitus ([place]).   She is a patient of Dr. 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  She never had a biopsy done and was treated intermittently with prednisone.  She has been told of late that the sarcoidosis is in remission.  Her recent CT scans have demonstrated pulmonary fibrosis and she has been referred here for further evaluation  Complains of intermittent chest tightness and pain.  Has dyspnea on exertion.  Denies any cough, congestion, sputum production  Pets: Dogs Occupation: Retired Radio broadcast assistant Exposures: No recent mold exposure from a leak in 2022.  No ongoing exposures.  No mold, hot tub, Jacuzzi.  No feather pillows or comforters ILD questionnaire 09/30/2021-negative Smoking history: Quit smoking in 1983 Travel history: No significant travel history Relevant family history: No family history of lung disease.  Interim history: Here for review of PFTs.  States that breathing is stable  Received note from Dr. 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  I agreed with the plan and communicated this with Dr. Kathlene November   Outpatient Encounter Medications as of 11/27/2021  Medication Sig   acetaminophen (TYLENOL) 500 MG tablet Take 1,000 mg by mouth 2 (two) times daily as needed for moderate pain or headache.   albuterol (PROVENTIL) (2.5 MG/3ML) 0.083% nebulizer solution Take 3 mLs (2.5 mg total) by nebulization daily as needed for wheezing or shortness of breath.   albuterol (VENTOLIN HFA) 108 (90 Base) MCG/ACT inhaler Inhale 2 puffs into the lungs every 6 (six) hours as needed for wheezing or shortness of breath.   aspirin EC 81 MG tablet Take 81 mg by mouth daily. Swallow whole.   atorvastatin (LIPITOR) 80 MG tablet Take 1 tablet (80 mg total) by mouth daily.   azaTHIOprine (IMURAN) 50 MG tablet Take by mouth.   azelastine (ASTELIN) 0.1 % nasal spray Place 1 spray into both nostrils 2 (two) times daily. Use in each nostril as directed   benzonatate (TESSALON) 200 MG capsule Take 1 capsule (200 mg total) by mouth 3 (three) times daily as needed for cough.   Blood Glucose Monitoring Suppl (ACCU-CHEK AVIVA PLUS) w/Device KIT Use to check blood sugars 3 times a day. Dx code e11.65   brimonidine (ALPHAGAN P) 0.1 % SOLN Place 1 drop into both eyes 2 (two) times daily.    brinzolamide (AZOPT) 1 % ophthalmic suspension Place 1 drop into both eyes 3 (three) times daily.   carvedilol (COREG) 12.5 MG tablet TAKE 1 TABLET(12.5 MG) BY MOUTH TWICE DAILY   cetirizine (ZYRTEC ALLERGY) 10 MG tablet Take 1 tablet (10 mg total) by mouth daily.   clopidogrel (PLAVIX) 75 MG tablet TAKE 1 TABLET(75 MG) BY MOUTH DAILY   EPINEPHrine 0.3 mg/0.3 mL IJ SOAJ injection Inject  0.3 mg into the muscle as needed for anaphylaxis.   ezetimibe (ZETIA) 10 MG tablet Take 1 tablet (10 mg total) by mouth daily.   furosemide (LASIX) 40 MG tablet Take 1 tablet (40 mg total) by mouth every other day.   isosorbide mononitrate (IMDUR) 60 MG 24 hr tablet TAKE 1 TABLET(60  MG) BY MOUTH DAILY   ketorolac (ACULAR) 0.5 % ophthalmic solution Place 1 drop into the right eye 3 (three) times daily.   Lancets (ONETOUCH DELICA PLUS [id number]) MISC CHECK BLOOD SUGAR BEFORE BREAKFAST AND DINNER   montelukast (SINGULAIR) 10 MG tablet TAKE 1 TABLET BY MOUTH EVERY EVENING   Nebulizers (COMPRESSOR/NEBULIZER) MISC Use as directed   pantoprazole (PROTONIX) 40 MG tablet TAKE 1 TABLET(40 MG) BY MOUTH DAILY   Polyvinyl Alcohol-Povidone PF (REFRESH) 1.4-0.6 % SOLN Place 1-2 drops into both eyes 3 (three) times daily as needed (dry/irritated eyes.).   potassium chloride SA (KLOR-CON) 20 MEQ tablet Take 1 tablet (20 mEq total) by mouth every other day.   prednisoLONE acetate (PRED FORTE) 1 % ophthalmic suspension Apply to eye.   pregabalin (LYRICA) 75 MG capsule TAKE 1 CAPSULE(75 MG) BY MOUTH TWICE DAILY   RHOPRESSA 0.02 % SOLN Place 1 drop into the left eye at bedtime.   sitaGLIPtin-metformin (JANUMET) 50-500 MG tablet Take 1 tablet by mouth 2 (two) times daily with a meal.   Travoprost, BAK Free, (TRAVATAN) 0.004 % SOLN ophthalmic solution Place 1 drop into the right eye at bedtime.   umeclidinium-vilanterol (ANORO ELLIPTA) 62.5-25 MCG/ACT AEPB Inhale 1 puff into the lungs daily at 6 (six) AM.   nitroGLYCERIN (NITROSTAT) 0.4 MG SL tablet Place 1 tablet (0.4 mg total) under the tongue every 5 (five) minutes as needed for chest pain.   No facility-administered encounter medications on file as of 11/27/2021.    Physical Exam: Blood pressure (!) 144/70, pulse 62, temperature 97.9 F (36.6 C), temperature source Oral, height 5' 2"  (1.575 m), weight 165 lb 12.8 oz (75.2 kg), SpO2 100 %. Gen:      No acute distress HEENT:  EOMI, sclera anicteric Neck:     No masses; no thyromegaly Lungs:    Clear to auscultation bilaterally; normal respiratory effort CV:         Regular rate and rhythm; no murmurs Abd:      + bowel sounds; soft, non-tender; no palpable masses, no distension Ext:    No  edema; adequate peripheral perfusion Skin:      Warm and dry; no rash Neuro: alert and oriented x 3 Psych: normal mood and affect   Data Reviewed: Imaging: High-resolution CT 08/14/2021- Mild bronchiectasis, peribronchovascular nodularity.  Stable basilar predominant fibrotic changes and probable UIP pattern.  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  She does have peribronchovascular nodularity in the upper lobe suggestive of sarcoidosis.  No connective tissue disease serologies or exposures noted.  Sarcoidosis can cause pulmonary fibrosis in a similar pattern.  Alternatively IPF can coexist with sarcoidosis.  Her interstitial lung disease has been stable on CT  scans dating back several years.  We discussed further work-up and possible biopsy.  Given stability we will monitor for now PFTs reviewed.  She has been started on azathioprine by Dr. Kathlene November for inflammatory arthritis which may be related to sarcoid.  I have discussed this with him and agree with the treatment as it may be beneficial for the lungs to Follow-up with CT scan in 6 months  Plan/Recommendations: High-resolution CT in 6 months  Marshell Garfinkel MD [facility] 11/27/2021, 2:20 PM  CC: Glendale Chard, MD

## 2021-11-27 NOTE — Patient Instructions (Signed)
I have reviewed your lung function test which is stable I agree with starting azathioprine.  I have already discussed with Dr. Kathlene November your rheumatologist We will get high-resolution CT in 6 months and follow-up in clinic after the test.

## 2021-11-29 DIAGNOSIS — I110 Hypertensive heart disease with heart failure: Secondary | ICD-10-CM | POA: Diagnosis not present

## 2021-11-29 DIAGNOSIS — I2510 Atherosclerotic heart disease of native coronary artery without angina pectoris: Secondary | ICD-10-CM | POA: Diagnosis not present

## 2021-11-29 DIAGNOSIS — I5032 Chronic diastolic (congestive) heart failure: Secondary | ICD-10-CM | POA: Diagnosis not present

## 2021-11-29 DIAGNOSIS — E1142 Type 2 diabetes mellitus with diabetic polyneuropathy: Secondary | ICD-10-CM | POA: Diagnosis not present

## 2021-11-29 DIAGNOSIS — N3000 Acute cystitis without hematuria: Secondary | ICD-10-CM | POA: Diagnosis not present

## 2021-11-29 DIAGNOSIS — D8689 Sarcoidosis of other sites: Secondary | ICD-10-CM | POA: Diagnosis not present

## 2021-12-05 DIAGNOSIS — D8689 Sarcoidosis of other sites: Secondary | ICD-10-CM | POA: Diagnosis not present

## 2021-12-05 DIAGNOSIS — N3000 Acute cystitis without hematuria: Secondary | ICD-10-CM | POA: Diagnosis not present

## 2021-12-05 DIAGNOSIS — I110 Hypertensive heart disease with heart failure: Secondary | ICD-10-CM | POA: Diagnosis not present

## 2021-12-05 DIAGNOSIS — I2510 Atherosclerotic heart disease of native coronary artery without angina pectoris: Secondary | ICD-10-CM | POA: Diagnosis not present

## 2021-12-05 DIAGNOSIS — E1142 Type 2 diabetes mellitus with diabetic polyneuropathy: Secondary | ICD-10-CM | POA: Diagnosis not present

## 2021-12-05 DIAGNOSIS — I5032 Chronic diastolic (congestive) heart failure: Secondary | ICD-10-CM | POA: Diagnosis not present

## 2021-12-08 DIAGNOSIS — I110 Hypertensive heart disease with heart failure: Secondary | ICD-10-CM | POA: Diagnosis not present

## 2021-12-08 DIAGNOSIS — Z7951 Long term (current) use of inhaled steroids: Secondary | ICD-10-CM | POA: Diagnosis not present

## 2021-12-08 DIAGNOSIS — D8689 Sarcoidosis of other sites: Secondary | ICD-10-CM | POA: Diagnosis not present

## 2021-12-08 DIAGNOSIS — J42 Unspecified chronic bronchitis: Secondary | ICD-10-CM | POA: Diagnosis not present

## 2021-12-08 DIAGNOSIS — H9193 Unspecified hearing loss, bilateral: Secondary | ICD-10-CM | POA: Diagnosis not present

## 2021-12-08 DIAGNOSIS — Z8614 Personal history of Methicillin resistant Staphylococcus aureus infection: Secondary | ICD-10-CM | POA: Diagnosis not present

## 2021-12-08 DIAGNOSIS — F32A Depression, unspecified: Secondary | ICD-10-CM | POA: Diagnosis not present

## 2021-12-08 DIAGNOSIS — H401122 Primary open-angle glaucoma, left eye, moderate stage: Secondary | ICD-10-CM | POA: Diagnosis not present

## 2021-12-08 DIAGNOSIS — I2510 Atherosclerotic heart disease of native coronary artery without angina pectoris: Secondary | ICD-10-CM | POA: Diagnosis not present

## 2021-12-08 DIAGNOSIS — E1142 Type 2 diabetes mellitus with diabetic polyneuropathy: Secondary | ICD-10-CM | POA: Diagnosis not present

## 2021-12-08 DIAGNOSIS — Z8619 Personal history of other infectious and parasitic diseases: Secondary | ICD-10-CM | POA: Diagnosis not present

## 2021-12-08 DIAGNOSIS — R002 Palpitations: Secondary | ICD-10-CM | POA: Diagnosis not present

## 2021-12-08 DIAGNOSIS — Z8701 Personal history of pneumonia (recurrent): Secondary | ICD-10-CM | POA: Diagnosis not present

## 2021-12-08 DIAGNOSIS — Z9013 Acquired absence of bilateral breasts and nipples: Secondary | ICD-10-CM | POA: Diagnosis not present

## 2021-12-08 DIAGNOSIS — G4730 Sleep apnea, unspecified: Secondary | ICD-10-CM | POA: Diagnosis not present

## 2021-12-08 DIAGNOSIS — E785 Hyperlipidemia, unspecified: Secondary | ICD-10-CM | POA: Diagnosis not present

## 2021-12-08 DIAGNOSIS — I341 Nonrheumatic mitral (valve) prolapse: Secondary | ICD-10-CM | POA: Diagnosis not present

## 2021-12-08 DIAGNOSIS — Z87891 Personal history of nicotine dependence: Secondary | ICD-10-CM | POA: Diagnosis not present

## 2021-12-08 DIAGNOSIS — J45909 Unspecified asthma, uncomplicated: Secondary | ICD-10-CM | POA: Diagnosis not present

## 2021-12-08 DIAGNOSIS — I5032 Chronic diastolic (congestive) heart failure: Secondary | ICD-10-CM | POA: Diagnosis not present

## 2021-12-08 DIAGNOSIS — J8410 Pulmonary fibrosis, unspecified: Secondary | ICD-10-CM | POA: Diagnosis not present

## 2021-12-08 DIAGNOSIS — N3000 Acute cystitis without hematuria: Secondary | ICD-10-CM | POA: Diagnosis not present

## 2021-12-08 DIAGNOSIS — M1990 Unspecified osteoarthritis, unspecified site: Secondary | ICD-10-CM | POA: Diagnosis not present

## 2021-12-08 DIAGNOSIS — J439 Emphysema, unspecified: Secondary | ICD-10-CM | POA: Diagnosis not present

## 2021-12-08 DIAGNOSIS — K219 Gastro-esophageal reflux disease without esophagitis: Secondary | ICD-10-CM | POA: Diagnosis not present

## 2021-12-10 DIAGNOSIS — E1142 Type 2 diabetes mellitus with diabetic polyneuropathy: Secondary | ICD-10-CM | POA: Diagnosis not present

## 2021-12-10 DIAGNOSIS — I110 Hypertensive heart disease with heart failure: Secondary | ICD-10-CM | POA: Diagnosis not present

## 2021-12-10 DIAGNOSIS — N3000 Acute cystitis without hematuria: Secondary | ICD-10-CM | POA: Diagnosis not present

## 2021-12-10 DIAGNOSIS — I5032 Chronic diastolic (congestive) heart failure: Secondary | ICD-10-CM | POA: Diagnosis not present

## 2021-12-10 DIAGNOSIS — D8689 Sarcoidosis of other sites: Secondary | ICD-10-CM | POA: Diagnosis not present

## 2021-12-10 DIAGNOSIS — I2510 Atherosclerotic heart disease of native coronary artery without angina pectoris: Secondary | ICD-10-CM | POA: Diagnosis not present

## 2021-12-11 DIAGNOSIS — H15012 Anterior scleritis, left eye: Secondary | ICD-10-CM | POA: Diagnosis not present

## 2021-12-11 DIAGNOSIS — H3581 Retinal edema: Secondary | ICD-10-CM | POA: Diagnosis not present

## 2021-12-11 DIAGNOSIS — H43813 Vitreous degeneration, bilateral: Secondary | ICD-10-CM | POA: Diagnosis not present

## 2021-12-11 DIAGNOSIS — H35373 Puckering of macula, bilateral: Secondary | ICD-10-CM | POA: Diagnosis not present

## 2021-12-11 DIAGNOSIS — H2013 Chronic iridocyclitis, bilateral: Secondary | ICD-10-CM | POA: Diagnosis not present

## 2021-12-12 DIAGNOSIS — E1142 Type 2 diabetes mellitus with diabetic polyneuropathy: Secondary | ICD-10-CM | POA: Diagnosis not present

## 2021-12-12 DIAGNOSIS — N3000 Acute cystitis without hematuria: Secondary | ICD-10-CM | POA: Diagnosis not present

## 2021-12-12 DIAGNOSIS — I2510 Atherosclerotic heart disease of native coronary artery without angina pectoris: Secondary | ICD-10-CM | POA: Diagnosis not present

## 2021-12-12 DIAGNOSIS — I5032 Chronic diastolic (congestive) heart failure: Secondary | ICD-10-CM | POA: Diagnosis not present

## 2021-12-12 DIAGNOSIS — I110 Hypertensive heart disease with heart failure: Secondary | ICD-10-CM | POA: Diagnosis not present

## 2021-12-12 DIAGNOSIS — D8689 Sarcoidosis of other sites: Secondary | ICD-10-CM | POA: Diagnosis not present

## 2021-12-12 NOTE — Progress Notes (Signed)
If she is no longer weak or having dizziness then yes that is fine

## 2021-12-15 ENCOUNTER — Other Ambulatory Visit (INDEPENDENT_AMBULATORY_CARE_PROVIDER_SITE_OTHER): Payer: Medicare Other | Admitting: Internal Medicine

## 2021-12-15 DIAGNOSIS — E1142 Type 2 diabetes mellitus with diabetic polyneuropathy: Secondary | ICD-10-CM

## 2021-12-15 DIAGNOSIS — M1990 Unspecified osteoarthritis, unspecified site: Secondary | ICD-10-CM

## 2021-12-15 DIAGNOSIS — J8410 Pulmonary fibrosis, unspecified: Secondary | ICD-10-CM

## 2021-12-15 DIAGNOSIS — E7800 Pure hypercholesterolemia, unspecified: Secondary | ICD-10-CM

## 2021-12-15 DIAGNOSIS — I739 Peripheral vascular disease, unspecified: Secondary | ICD-10-CM | POA: Diagnosis not present

## 2021-12-15 DIAGNOSIS — I5032 Chronic diastolic (congestive) heart failure: Secondary | ICD-10-CM

## 2021-12-15 DIAGNOSIS — Z9981 Dependence on supplemental oxygen: Secondary | ICD-10-CM | POA: Diagnosis not present

## 2021-12-15 DIAGNOSIS — N3000 Acute cystitis without hematuria: Secondary | ICD-10-CM | POA: Diagnosis not present

## 2021-12-15 DIAGNOSIS — I110 Hypertensive heart disease with heart failure: Secondary | ICD-10-CM | POA: Diagnosis not present

## 2021-12-15 DIAGNOSIS — M5136 Other intervertebral disc degeneration, lumbar region: Secondary | ICD-10-CM

## 2021-12-15 DIAGNOSIS — Z9181 History of falling: Secondary | ICD-10-CM

## 2021-12-15 DIAGNOSIS — G4733 Obstructive sleep apnea (adult) (pediatric): Secondary | ICD-10-CM | POA: Diagnosis not present

## 2021-12-15 NOTE — Progress Notes (Signed)
CC: home health certification  Received home health orders orders from [facility]. Start of care 11/08/21.   Certification and orders from 11/08/21 through 01/06/22 are reviewed, signed and faxed back to home health company.  Need of intermittent skilled services at home: PT  The home health care plan has been established by me and will be reviewed and updated as needed to maximize patient recovery.  I certify that all home health services have been and will be furnished to the patient while under my care.  Face-to-face encounter in which the need for home health services was established: 10/15/21  Patient is receiving home health services for the following diagnoses: Problem List Items Addressed This Visit       Cardiovascular and Mediastinum   Peripheral vascular disease, unspecified ([place])   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 heart failure ([place])     Endocrine   Diabetic polyneuropathy associated with type 2 diabetes mellitus ([place])     Musculoskeletal and Integument   DDD (degenerative disc disease), lumbar   Chronic arthritis     Genitourinary   Urinary tract infection without hematuria - Primary     Other   Hyperlipemia   Oxygen dependent   History of fall   Other Visit Diagnoses     Obstructive sleep apnea syndrome       Fibrosis lung (HCC)            Maximino Greenland, MD

## 2021-12-17 DIAGNOSIS — D8689 Sarcoidosis of other sites: Secondary | ICD-10-CM | POA: Diagnosis not present

## 2021-12-17 DIAGNOSIS — I2510 Atherosclerotic heart disease of native coronary artery without angina pectoris: Secondary | ICD-10-CM | POA: Diagnosis not present

## 2021-12-17 DIAGNOSIS — N3000 Acute cystitis without hematuria: Secondary | ICD-10-CM | POA: Diagnosis not present

## 2021-12-17 DIAGNOSIS — E1142 Type 2 diabetes mellitus with diabetic polyneuropathy: Secondary | ICD-10-CM | POA: Diagnosis not present

## 2021-12-17 DIAGNOSIS — I110 Hypertensive heart disease with heart failure: Secondary | ICD-10-CM | POA: Diagnosis not present

## 2021-12-17 DIAGNOSIS — I5032 Chronic diastolic (congestive) heart failure: Secondary | ICD-10-CM | POA: Diagnosis not present

## 2021-12-18 ENCOUNTER — Other Ambulatory Visit: Payer: Self-pay | Admitting: Internal Medicine

## 2021-12-24 ENCOUNTER — Other Ambulatory Visit: Payer: Self-pay | Admitting: Internal Medicine

## 2021-12-24 MED ORDER — PREGABALIN 75 MG PO CAPS: ORAL_CAPSULE | ORAL | 2 refills | Status: DC

## 2021-12-25 DIAGNOSIS — J8410 Pulmonary fibrosis, unspecified: Secondary | ICD-10-CM | POA: Diagnosis not present

## 2021-12-25 DIAGNOSIS — I890 Lymphedema, not elsewhere classified: Secondary | ICD-10-CM | POA: Diagnosis not present

## 2021-12-25 DIAGNOSIS — D8686 Sarcoid arthropathy: Secondary | ICD-10-CM | POA: Diagnosis not present

## 2021-12-25 DIAGNOSIS — Z79899 Other long term (current) drug therapy: Secondary | ICD-10-CM | POA: Diagnosis not present

## 2021-12-25 DIAGNOSIS — M2550 Pain in unspecified joint: Secondary | ICD-10-CM | POA: Diagnosis not present

## 2021-12-25 DIAGNOSIS — D869 Sarcoidosis, unspecified: Secondary | ICD-10-CM | POA: Diagnosis not present

## 2021-12-25 DIAGNOSIS — M0609 Rheumatoid arthritis without rheumatoid factor, multiple sites: Secondary | ICD-10-CM | POA: Diagnosis not present

## 2021-12-25 DIAGNOSIS — M1990 Unspecified osteoarthritis, unspecified site: Secondary | ICD-10-CM | POA: Diagnosis not present

## 2021-12-25 DIAGNOSIS — R768 Other specified abnormal immunological findings in serum: Secondary | ICD-10-CM | POA: Diagnosis not present

## 2021-12-26 DIAGNOSIS — E1142 Type 2 diabetes mellitus with diabetic polyneuropathy: Secondary | ICD-10-CM | POA: Diagnosis not present

## 2021-12-26 DIAGNOSIS — D8689 Sarcoidosis of other sites: Secondary | ICD-10-CM | POA: Diagnosis not present

## 2021-12-26 DIAGNOSIS — N3000 Acute cystitis without hematuria: Secondary | ICD-10-CM | POA: Diagnosis not present

## 2021-12-26 DIAGNOSIS — I110 Hypertensive heart disease with heart failure: Secondary | ICD-10-CM | POA: Diagnosis not present

## 2021-12-26 DIAGNOSIS — I2510 Atherosclerotic heart disease of native coronary artery without angina pectoris: Secondary | ICD-10-CM | POA: Diagnosis not present

## 2021-12-26 DIAGNOSIS — I5032 Chronic diastolic (congestive) heart failure: Secondary | ICD-10-CM | POA: Diagnosis not present

## 2021-12-31 DIAGNOSIS — I2510 Atherosclerotic heart disease of native coronary artery without angina pectoris: Secondary | ICD-10-CM | POA: Diagnosis not present

## 2021-12-31 DIAGNOSIS — I110 Hypertensive heart disease with heart failure: Secondary | ICD-10-CM | POA: Diagnosis not present

## 2021-12-31 DIAGNOSIS — N3000 Acute cystitis without hematuria: Secondary | ICD-10-CM | POA: Diagnosis not present

## 2021-12-31 DIAGNOSIS — E1142 Type 2 diabetes mellitus with diabetic polyneuropathy: Secondary | ICD-10-CM | POA: Diagnosis not present

## 2021-12-31 DIAGNOSIS — D8689 Sarcoidosis of other sites: Secondary | ICD-10-CM | POA: Diagnosis not present

## 2021-12-31 DIAGNOSIS — I5032 Chronic diastolic (congestive) heart failure: Secondary | ICD-10-CM | POA: Diagnosis not present

## 2022-01-01 ENCOUNTER — Ambulatory Visit: Payer: Medicare Other | Attending: Interventional Cardiology | Admitting: Interventional Cardiology

## 2022-01-01 ENCOUNTER — Telehealth: Payer: Self-pay | Admitting: Cardiovascular Disease

## 2022-01-01 VITALS — BP 164/94 | HR 76 | Ht 62.0 in | Wt 161.0 lb

## 2022-01-01 DIAGNOSIS — E782 Mixed hyperlipidemia: Secondary | ICD-10-CM | POA: Insufficient documentation

## 2022-01-01 DIAGNOSIS — I25119 Atherosclerotic heart disease of native coronary artery with unspecified angina pectoris: Secondary | ICD-10-CM

## 2022-01-01 DIAGNOSIS — I10 Essential (primary) hypertension: Secondary | ICD-10-CM

## 2022-01-01 MED ORDER — CARVEDILOL 25 MG PO TABS: 25.0000 mg | ORAL_TABLET | Freq: Two times a day (BID) | ORAL | 3 refills | Status: DC

## 2022-01-01 NOTE — Telephone Encounter (Signed)
Pt c/o BP issue: STAT if pt c/o blurred vision, one-sided weakness or slurred speech  1. What are your last 5 BP readings?  177/117 -- Today  153/99 129/83 142/92 131/71  2. Are you having any other symptoms (ex. Dizziness, headache, blurred vision, passed out)? Headache   3. What is your BP issue? Patient states that she can not bring down her BP and would like to discuss this.

## 2022-01-01 NOTE — Progress Notes (Signed)
Cardiology Office Note   Date:  01/01/2022   ID:  Sheila, Reynolds Feb 09, 1946, MRN [id number]  PCP:  Glendale Chard, MD    No chief complaint on file.  CAD  Wt Readings from Last 3 Encounters:  01/01/22 161 lb (73 kg)  11/27/21 165 lb 12.8 oz (75.2 kg)  11/19/21 168 lb 9.6 oz (76.5 kg)       History of Present Illness: Sheila Reynolds is a 76 y.o. female   with CAD.  Caths in Nov/December 2018 showed: No significant disease involving codominant LCx and RCA. Upper normal to mildly elevated left and right heart filling pressures. Mild pulmonary hypertension. Low normal Fick cardiac output/index."70% proximal/mid LAD stenosis (previously found to be hemodynamically significant with resting Pd/Pa of 0.79). 70% ostial D2 stenosis. Successful orbital atherectomy and PCI using Xience Sierra 3.5 x 33 mm DES with 10% proximal stenosis (post-dilated with 4.0 Kearns balloon at high pressure) and TIMI-3 flow. Successful PTCA to ostial D2 (including kissing balloon inflation) with reduction in stenosis to 50% and TIMI-3 flow.   Recommendations: Dual antiplatelet therapy with aspirin and clopidogrel for at least 6 months, ideally longer. Aggressive secondary prevention."  Normal stress test in November 2021.  Reported some atypical chest pain in 2023.  Today comes in with elevated blood pressure.  Was high yesterday up to 190/100.    Denies : Chest pain. Dizziness.  Nitroglycerin use. Orthopnea. Palpitations. Paroxysmal nocturnal dyspnea. Shortness of breath. Syncope.    Patient with h/o lymphedema.  She walks in her house and in the driveway. She walks in stores.  That is most strenuous exercise.   Past Medical History:  Diagnosis Date   Anemia    3 months ago anemic   Anxiety    on meds   Arthritis    "all over" (01/15/2017)   Asthma    Bronchitis with emphysema    Chest pain    Chronic bronchitis ([place])    Coronary artery disease    a. 01/2017 she underwent  orbital atherectomy/DES to the proxmal LAD and PTCA to ostial D2. 2D Echo 01/15/17 showed mild LVH, EF 60-65%, grade 1 DD.   Family history of anesthesia complication    daughter N/V   Fibromyalgia    GERD (gastroesophageal reflux disease)    on meds   Glaucoma    Heart murmur    History of hiatal hernia    Hx of echocardiogram    Echo (03/2013):  Tech limited; Mild focal basal septal hypertrophy, EF 60-65%, normal RVF   Hyperlipidemia    Hypertension    Nonsustained ventricular tachycardia (HCC)    OSA on CPAP    Premature atrial contractions    PVC (premature ventricular contraction)    a. Holter 12/16: NSR, occ PAC,PVCs   Sarcoidosis    Type II diabetes mellitus (Sheila Reynolds)     Past Surgical History:  Procedure Laterality Date   BREAST SURGERY     CARDIAC CATHETERIZATION  05/04/2007   reveals overall normal left ventricular systolic function. Ejection fraction 65-70%   CATARACT EXTRACTION W/PHACO Right 07/20/2013   Procedure: CATARACT EXTRACTION PHACO AND INTRAOCULAR LENS PLACEMENT (IOC);  Surgeon: Marylynn Pearson, MD;  Location: [place];  Service: Ophthalmology;  Laterality: Right;   COLONOSCOPY W/ BIOPSIES AND POLYPECTOMY     COLONOSCOPY WITH PROPOFOL N/A 09/07/2014   Procedure: COLONOSCOPY WITH PROPOFOL;  Surgeon: Juanita Craver, MD;  Location: [facility];  Service: Endoscopy;  Laterality: N/A;   COLONOSCOPY WITH  PROPOFOL N/A 01/10/2020   Procedure: COLONOSCOPY WITH PROPOFOL;  Surgeon: Juanita Craver, MD;  Location: [facility];  Service: Endoscopy;  Laterality: N/A;   CORONARY ANGIOPLASTY WITH STENT PLACEMENT  01/15/2017   CORONARY ATHERECTOMY N/A 01/15/2017   Procedure: CORONARY ATHERECTOMY;  Surgeon: Nelva Bush, MD;  Location: [facility];  Service: Cardiovascular;  Laterality: N/A;   CORONARY BALLOON ANGIOPLASTY N/A 01/15/2017   Procedure: CORONARY BALLOON ANGIOPLASTY;  Surgeon: Nelva Bush, MD;  Location: [place] [facility];  Service: Cardiovascular;  Laterality: N/A;    CORONARY STENT INTERVENTION N/A 01/15/2017   Procedure: CORONARY STENT INTERVENTION;  Surgeon: Nelva Bush, MD;  Location: [place] [facility];  Service: Cardiovascular;  Laterality: N/A;   ESOPHAGOGASTRODUODENOSCOPY (EGD) WITH PROPOFOL N/A 09/07/2014   Procedure: ESOPHAGOGASTRODUODENOSCOPY (EGD) WITH PROPOFOL;  Surgeon: Juanita Craver, MD;  Location: [facility];  Service: Endoscopy;  Laterality: N/A;   EXTERNAL EAR SURGERY Bilateral 1970s   tumors removed   EYE SURGERY Left 2019   cataract extraction    EYE SURGERY Right 02/09/2018   eyelid surgery    INTRAVASCULAR PRESSURE WIRE/FFR STUDY N/A 12/12/2016   Procedure: INTRAVASCULAR PRESSURE WIRE/FFR STUDY;  Surgeon: Nelva Bush, MD;  Location: [place] [facility];  Service: Cardiovascular;  Laterality: N/A;   MINI SHUNT INSERTION Right 07/20/2013   Procedure: INSERTION OF GLAUCOMA FILTRATION DEVICE RIGHT EYE;  Surgeon: Marylynn Pearson, MD;  Location: [place];  Service: Ophthalmology;  Laterality: Right;   MITOMYCIN C APPLICATION Right 2/95/2841   Procedure: MITOMYCIN C APPLICATION;  Surgeon: Marylynn Pearson, MD;  Location: [place];  Service: Ophthalmology;  Laterality: Right;   MITOMYCIN C APPLICATION Right 05/03/4008   Procedure: MITOMYCIN C APPLICATION RIGHT EYE;  Surgeon: Marylynn Pearson, MD;  Location: [place];  Service: Ophthalmology;  Laterality: Right;   PLACEMENT OF BREAST IMPLANTS Bilateral 1992   "took all my breast tissue out; put implants in;fibrocystic breast disease "   POLYPECTOMY  01/10/2020   Procedure: POLYPECTOMY;  Surgeon: Juanita Craver, MD;  Location: [facility];  Service: Endoscopy;;   RIGHT/LEFT HEART CATH AND CORONARY ANGIOGRAPHY N/A 12/12/2016   Procedure: RIGHT/LEFT HEART CATH AND CORONARY ANGIOGRAPHY;  Surgeon: Nelva Bush, MD;  Location: [place] [facility];  Service: Cardiovascular;  Laterality: N/A;   TONSILLECTOMY     TOTAL ABDOMINAL HYSTERECTOMY  1982   TRABECULECTOMY Right 02/21/2015   Procedure: TRABECULECTOMY  WITH [facility] ON THE RIGHT EYE;  Surgeon: Marylynn Pearson, MD;  Location: [place];  Service: Ophthalmology;  Laterality: Right;     Current Outpatient Medications  Medication Sig Dispense Refill   acetaminophen (TYLENOL) 500 MG tablet Take 1,000 mg by mouth 2 (two) times daily as needed for moderate pain or headache.     albuterol (PROVENTIL) (2.5 MG/3ML) 0.083% nebulizer solution Take 3 mLs (2.5 mg total) by nebulization daily as needed for wheezing or shortness of breath. 75 mL 12   albuterol (VENTOLIN HFA) 108 (90 Base) MCG/ACT inhaler Inhale 2 puffs into the lungs every 6 (six) hours as needed for wheezing or shortness of breath. 18 g 12   ALPRAZolam (XANAX) 1 MG tablet Take by mouth.     aspirin EC 81 MG tablet Take 81 mg by mouth daily. Swallow whole.     atorvastatin (LIPITOR) 80 MG tablet Take 1 tablet (80 mg total) by mouth daily. 90 tablet 3   azaTHIOprine (IMURAN) 50 MG tablet Take by mouth.     azelastine (ASTELIN) 0.1 % nasal spray Place 1 spray into both nostrils  2 (two) times daily. Use in each nostril as directed 30 mL 1   benzonatate (TESSALON) 200 MG capsule Take 1 capsule (200 mg total) by mouth 3 (three) times daily as needed for cough. 30 capsule 2   Blood Glucose Monitoring Suppl (ACCU-CHEK AVIVA PLUS) w/Device KIT Use to check blood sugars 3 times a day. Dx code e11.65 1 kit 3   brimonidine (ALPHAGAN P) 0.1 % SOLN Place 1 drop into both eyes 2 (two) times daily.      brinzolamide (AZOPT) 1 % ophthalmic suspension Place 1 drop into both eyes 3 (three) times daily.     carvedilol (COREG) 12.5 MG tablet TAKE 1 TABLET(12.5 MG) BY MOUTH TWICE DAILY 60 tablet 0   clopidogrel (PLAVIX) 75 MG tablet TAKE 1 TABLET(75 MG) BY MOUTH DAILY 90 tablet 3   EPINEPHrine 0.3 mg/0.3 mL IJ SOAJ injection Inject 0.3 mg into the muscle as needed for anaphylaxis. 1 each 0   ezetimibe (ZETIA) 10 MG tablet Take 1 tablet (10 mg total) by mouth daily. 90 tablet 3   isosorbide mononitrate (IMDUR) 60 MG 24 hr  tablet TAKE 1 TABLET(60 MG) BY MOUTH DAILY 90 tablet 3   ketorolac (ACULAR) 0.5 % ophthalmic solution Place 1 drop into the right eye 3 (three) times daily.     Lancets (ONETOUCH DELICA PLUS [id number]) MISC CHECK BLOOD SUGAR BEFORE BREAKFAST AND DINNER 100 each 1   montelukast (SINGULAIR) 10 MG tablet TAKE 1 TABLET BY MOUTH EVERY EVENING 90 tablet 1   Nebulizers (COMPRESSOR/NEBULIZER) MISC Use as directed 1 each 0   pantoprazole (PROTONIX) 40 MG tablet TAKE 1 TABLET(40 MG) BY MOUTH DAILY 90 tablet 3   Polyvinyl Alcohol-Povidone PF (REFRESH) 1.4-0.6 % SOLN Place 1-2 drops into both eyes 3 (three) times daily as needed (dry/irritated eyes.).     prednisoLONE acetate (PRED FORTE) 1 % ophthalmic suspension Apply to eye.     pregabalin (LYRICA) 75 MG capsule TAKE 1 CAPSULE(75 MG) BY MOUTH TWICE DAILY 180 capsule 2   RHOPRESSA 0.02 % SOLN Place 1 drop into the left eye at bedtime.     sitaGLIPtin-metformin (JANUMET) 50-500 MG tablet Take 1 tablet by mouth 2 (two) times daily with a meal. 90 tablet 1   Travoprost, BAK Free, (TRAVATAN) 0.004 % SOLN ophthalmic solution Place 1 drop into the right eye at bedtime.     umeclidinium-vilanterol (ANORO ELLIPTA) 62.5-25 MCG/ACT AEPB Inhale 1 puff into the lungs daily at 6 (six) AM. 1 each 12   cetirizine (ZYRTEC ALLERGY) 10 MG tablet Take 1 tablet (10 mg total) by mouth daily. (Patient not taking: Reported on 01/01/2022) 30 tablet 2   furosemide (LASIX) 40 MG tablet Take 1 tablet (40 mg total) by mouth every other day. (Patient not taking: Reported on 01/01/2022) 90 tablet 1   hydrochlorothiazide (HYDRODIURIL) 25 MG tablet Take by mouth. (Patient not taking: Reported on 01/01/2022)     nitroGLYCERIN (NITROSTAT) 0.4 MG SL tablet Place 1 tablet (0.4 mg total) under the tongue every 5 (five) minutes as needed for chest pain. 25 tablet prn   potassium chloride SA (KLOR-CON) 20 MEQ tablet Take 1 tablet (20 mEq total) by mouth every other day. (Patient not taking:  Reported on 01/01/2022) 90 tablet 1   traMADol (ULTRAM) 50 MG tablet Take by mouth. (Patient not taking: Reported on 01/01/2022)     No current facility-administered medications for this visit.    Allergies:   Crestor [rosuvastatin calcium], Demerol  [meperidine hcl], and Shellfish  allergy    Social History:  The patient  reports that she quit smoking about 41 years ago. Her smoking use included cigarettes. She has a 4.00 pack-year smoking history. She has never used smokeless tobacco. She reports that she does not drink alcohol and does not use drugs.   Family History:  The patient's family history includes Cancer in her mother and paternal grandmother; Diabetes in her brother and father; Glaucoma in her brother and father; Heart disease in her father and paternal grandmother.    ROS:  Please see the history of present illness.   Otherwise, review of systems are positive for fatigue.   All other systems are reviewed and negative.    PHYSICAL EXAM: VS:  BP (!) 164/94   Pulse 76   Ht _0  (1.575 m)   Wt 161 lb (73 kg)   SpO2 98%   BMI 29.45 kg/m  , BMI Body mass index is 29.45 kg/m. GEN: Well nourished, well developed, in no acute distress HEENT: normal Neck: no JVD, carotid bruits, or masses Cardiac: RRR; no murmurs, rubs, or gallops,no edema  Respiratory:  clear to auscultation bilaterally, normal work of breathing GI: soft, nontender, nondistended, + BS MS: no deformity or atrophy Skin: warm and dry, no rash Neuro:  Strength and sensation are intact Psych: euthymic mood, full affect   EKG:   The ekg ordered today demonstrates NSR, no ST changes   Recent Labs: 06/13/2021: TSH 0.892 08/20/2021: Creatinine, Ser 0.80 10/15/2021: Platelets 224 10/23/2021: ALT 23; BUN 11; Hemoglobin 12.0; Potassium 4.0; Sodium 137   Lipid Panel    Component Value Date/Time   CHOL 192 08/20/2021 1207   TRIG 156 (H) 08/20/2021 1207   HDL 47 08/20/2021 1207   CHOLHDL 4.1 08/20/2021 1207    CHOLHDL 3 05/24/2014 1650   VLDL 20.8 05/24/2014 1650   LDLCALC 117 (H) 08/20/2021 1207     Other studies Reviewed: Additional studies/ records that were reviewed today with results demonstrating: Creatinine 0.8 in July 2023.   ASSESSMENT AND PLAN:  CAD: Had some atypical pains in the past, most recently in July 2023.  No symptoms currently. HTN: Elevated readings recently.  Low-salt diet is recommended.  Regular exercise at target noted below.  Avoid processed foods.  Increase carvedilol to 25 mg twice a day. Hyperlipidemia: Whole food, plant-based diet recommended.  Low-salt diet.  LDL 117 in July 2023.  Intolerant to Crestor.  Pharm.D. can also address lipids when they see for follow-up blood pressure. Venous insufficiency: Elevate legs.   Current medicines are reviewed at length with the patient today.  The patient concerns regarding her medicines were addressed.  The following changes have been made: Increase carvedilol to 25 mg twice a day  Labs/ tests ordered today include:  No orders of the defined types were placed in this encounter.   Recommend 150 minutes/week of aerobic exercise Low fat, low carb, high fiber diet recommended  Disposition:   FU in with PharmD in 2 weeks   Signed, Larae Grooms, MD  01/01/2022 3:58 PM    [facility] [place], [place],   [address] Phone: [phone]; Fax: [phone]

## 2022-01-01 NOTE — Telephone Encounter (Signed)
Patient reports elevated BP with H/A. She denies SOB, CP or palpitations. She has been receiving PT in the home and the therapist has been checking her BP and noticed a trend up. Advised her to let her MD know if this continued.   Today when she checked it was 177/117. During the call I had her recheck it and it was 191/91, HR 88. When she checked in the other are it was 146/97. She is using a wrist cuff and not an arm cuff.   She will come in today for evaluation with Dr. Irish Lack.

## 2022-01-01 NOTE — Patient Instructions (Addendum)
Medication Instructions:  Your physician has recommended you make the following change in your medication:  Increase Carvedilol to 25 mg by mouth twice daily   *If you need a refill on your cardiac medications before your next appointment, please call your pharmacy*   Lab Work: none If you have labs (blood work) drawn today and your tests are completely normal, you will receive your results only by: [place] (if you have MyChart) OR A paper copy in the mail If you have any lab test that is abnormal or we need to change your treatment, we will call you to review the results.   Testing/Procedures: none   Follow-Up: At [facility], you and your health needs are our priority.  As part of our continuing mission to provide you with exceptional heart care, we have created designated Provider Care Teams.  These Care Teams include your primary Cardiologist (physician) and Advanced Practice Providers (APPs -  Physician Assistants and Nurse Practitioners) who all work together to provide you with the care you need, when you need it.  We recommend signing up for the patient portal called "MyChart".  Sign up information is provided on this After Visit Summary.  MyChart is used to connect with patients for Virtual Visits (Telemedicine).  Patients are able to view lab/test results, encounter notes, upcoming appointments, etc.  Non-urgent messages can be sent to your provider as well.   To learn more about what you can do with MyChart, go to [email or web address].    Your next appointment:   To be determined after seen by PharmD  The format for your next appointment:   In Person  Provider:   Mertie Moores, MD     Other Instructions You have been referred to the hypertension clinic in our office.  Please schedule appointment with pharmacist for 2-3 weeks from now   Important Information About Sugar

## 2022-01-06 NOTE — Addendum Note (Signed)
Addended by: Sharee Holster R on: 01/06/2022 02:03 PM   Modules accepted: Orders

## 2022-01-10 DIAGNOSIS — R350 Frequency of micturition: Secondary | ICD-10-CM | POA: Diagnosis not present

## 2022-01-10 DIAGNOSIS — Z01419 Encounter for gynecological examination (general) (routine) without abnormal findings: Secondary | ICD-10-CM | POA: Diagnosis not present

## 2022-01-10 DIAGNOSIS — Z6829 Body mass index (BMI) 29.0-29.9, adult: Secondary | ICD-10-CM | POA: Diagnosis not present

## 2022-01-10 DIAGNOSIS — L309 Dermatitis, unspecified: Secondary | ICD-10-CM | POA: Diagnosis not present

## 2022-01-10 DIAGNOSIS — N898 Other specified noninflammatory disorders of vagina: Secondary | ICD-10-CM | POA: Diagnosis not present

## 2022-01-19 NOTE — Progress Notes (Signed)
Patient ID: Sheila Reynolds                 DOB: 05-Jul-1945                      MRN: [id number]     HPI: Sheila Reynolds is a 76 y.o. female referred by Dr. Irish Lack to HTN clinic. 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 Echocardiogram 09/05/21 showed LVEF of 60-65% and grade 1 diastolic dysfunction. Patient informed Dr. Acie Fredrickson via telephone on 01/01/22 that she had been experiencing elevated BPs with headache and was sent to Dr. Irish Lack in clinic the same day. At that visit, Dr. Irish Lack increased Coreg to 25 mg BID. Most recent LDL was elevated, however previous LDL was near goal at 57. Suspect this is due to adherence.   Patient reports feeling weak today with intermittent chest and back pain, but states this has been going on for some time. Denies headache, lightheadedness, dizziness. Denies taking lasix every other day, but instead every week or 2 given severe cramping. She only takes potassium with lasix. Patient took ibuprofen scheduled per her eye doctor in November for an eye infection. She measures her BP at home before taking AM medications reporting BP averaging 588-502D systolic. She does not currently take HCTZ and has not filled it. BP technique was evaluated today and patient does not have proper technique. She did not bring in her cuff, so it is not yet validated. Her diet is relatively high in salt and she does not measure intake.   Her most recent lipid panel was only on ezetimibe as the patient self-discontinued atorvastatin. She resumed it along with Zetia after her most recent lab showed LDL above goal. Diet is high in fats.  Current HTN meds: Coreg 25 mg BID, furosemide 40 mg prn, Imdur 60 mg daily Current HLD meds: atorvastatin 80 mg daily, Zetia 10 mg daily Previously tried: rosuvastatin (myalgias) BP goal: <130/80 LDL goal:  <55 mg/dL given ASCVD + DM Labs: Lipid panel 08/20/21 on ezetimibe 10 mg daily: TC 192, TG 156, HDL 47, VLDL 28, LDL 117   Family History:  Family History  Problem Relation Age of Onset   Heart disease Father    Diabetes Father    Glaucoma Father    Cancer Mother        unknown type, ?lung   Heart disease Paternal Grandmother    Cancer Paternal Grandmother        unknown type   Diabetes Brother    Glaucoma Brother    Social History: The patient  reports that she quit smoking about 41 years ago. Her smoking use included cigarettes. She has a 4.00 pack-year smoking history. She has never used smokeless tobacco. She reports that she does not drink alcohol and does not use drugs.    Diet: Patient reports eating oatmeal, toast, banana or apple sauce with water for breakfast; 'whatever' for lunch, usually leftovers or a hot dog; cubed steak, mashed potatoes and gravy, green peas, roll, etc. Beverages are generally coffee or water. Occasional sodas. Tries to avoid salt.  Exercise: walks 3 times weekly for 20-30 minutes.  Home BP readings:   170-190s/90s  Wt Readings from Last 3 Encounters:  01/01/22 161 lb (73 kg)  11/27/21 165 lb 12.8 oz (75.2 kg)  11/19/21 168 lb  9.6 oz (76.5 kg)   BP Readings from Last 3 Encounters:  01/01/22 (!) 164/94  11/27/21 (!) 144/70  11/19/21 (!) 122/90   Pulse Readings from Last 3 Encounters:  01/01/22 76  11/27/21 62  11/19/21 68    Renal function: CrCl cannot be calculated (Patient's most recent lab result is older than the maximum 21 days allowed.).  Past Medical History:  Diagnosis Date   Anemia    3 months ago anemic   Anxiety    on meds   Arthritis    "all over" (01/15/2017)   Asthma    Bronchitis with emphysema    Chest pain    Chronic bronchitis ([place])    Coronary artery disease    a. 01/2017 she underwent orbital atherectomy/DES to the proxmal LAD and PTCA to ostial D2. 2D Echo 01/15/17 showed mild LVH, EF 60-65%, grade 1 DD.    Family history of anesthesia complication    daughter N/V   Fibromyalgia    GERD (gastroesophageal reflux disease)    on meds   Glaucoma    Heart murmur    History of hiatal hernia    Hx of echocardiogram    Echo (03/2013):  Tech limited; Mild focal basal septal hypertrophy, EF 60-65%, normal RVF   Hyperlipidemia    Hypertension    Nonsustained ventricular tachycardia (HCC)    OSA on CPAP    Premature atrial contractions    PVC (premature ventricular contraction)    a. Holter 12/16: NSR, occ PAC,PVCs   Sarcoidosis    Type II diabetes mellitus ([place])     Current Outpatient Medications on File Prior to Visit  Medication Sig Dispense Refill   acetaminophen (TYLENOL) 500 MG tablet Take 1,000 mg by mouth 2 (two) times daily as needed for moderate pain or headache.     albuterol (PROVENTIL) (2.5 MG/3ML) 0.083% nebulizer solution Take 3 mLs (2.5 mg total) by nebulization daily as needed for wheezing or shortness of breath. 75 mL 12   albuterol (VENTOLIN HFA) 108 (90 Base) MCG/ACT inhaler Inhale 2 puffs into the lungs every 6 (six) hours as needed for wheezing or shortness of breath. 18 g 12   ALPRAZolam (XANAX) 1 MG tablet Take by mouth.     aspirin EC 81 MG tablet Take 81 mg by mouth daily. Swallow whole.     atorvastatin (LIPITOR) 80 MG tablet Take 1 tablet (80 mg total) by mouth daily. 90 tablet 3   azaTHIOprine (IMURAN) 50 MG tablet Take by mouth.     azelastine (ASTELIN) 0.1 % nasal spray Place 1 spray into both nostrils 2 (two) times daily. Use in each nostril as directed 30 mL 1   benzonatate (TESSALON) 200 MG capsule Take 1 capsule (200 mg total) by mouth 3 (three) times daily as needed for cough. 30 capsule 2   Blood Glucose Monitoring Suppl (ACCU-CHEK AVIVA PLUS) w/Device KIT Use to check blood sugars 3 times a day. Dx code e11.65 1 kit 3   brimonidine (ALPHAGAN P) 0.1 % SOLN Place 1 drop into both eyes 2 (two) times daily.      brinzolamide (AZOPT) 1 % ophthalmic suspension Place  1 drop into both eyes 3 (three) times daily.     carvedilol (COREG) 25 MG tablet Take 1 tablet (25 mg total) by mouth 2 (two) times daily. 180 tablet 3   cetirizine (ZYRTEC ALLERGY) 10 MG tablet Take 1 tablet (10 mg total) by mouth daily. (Patient not taking: Reported on 01/01/2022) 30  tablet 2   clopidogrel (PLAVIX) 75 MG tablet TAKE 1 TABLET(75 MG) BY MOUTH DAILY 90 tablet 3   EPINEPHrine 0.3 mg/0.3 mL IJ SOAJ injection Inject 0.3 mg into the muscle as needed for anaphylaxis. 1 each 0   ezetimibe (ZETIA) 10 MG tablet Take 1 tablet (10 mg total) by mouth daily. 90 tablet 3   furosemide (LASIX) 40 MG tablet Take 1 tablet (40 mg total) by mouth every other day. (Patient not taking: Reported on 01/01/2022) 90 tablet 1   hydrochlorothiazide (HYDRODIURIL) 25 MG tablet Take by mouth. (Patient not taking: Reported on 01/01/2022)     isosorbide mononitrate (IMDUR) 60 MG 24 hr tablet TAKE 1 TABLET(60 MG) BY MOUTH DAILY 90 tablet 3   ketorolac (ACULAR) 0.5 % ophthalmic solution Place 1 drop into the right eye 3 (three) times daily.     Lancets (ONETOUCH DELICA PLUS [id number]) MISC CHECK BLOOD SUGAR BEFORE BREAKFAST AND DINNER 100 each 1   montelukast (SINGULAIR) 10 MG tablet TAKE 1 TABLET BY MOUTH EVERY EVENING 90 tablet 1   Nebulizers (COMPRESSOR/NEBULIZER) MISC Use as directed 1 each 0   nitroGLYCERIN (NITROSTAT) 0.4 MG SL tablet Place 1 tablet (0.4 mg total) under the tongue every 5 (five) minutes as needed for chest pain. 25 tablet prn   pantoprazole (PROTONIX) 40 MG tablet TAKE 1 TABLET(40 MG) BY MOUTH DAILY 90 tablet 3   Polyvinyl Alcohol-Povidone PF (REFRESH) 1.4-0.6 % SOLN Place 1-2 drops into both eyes 3 (three) times daily as needed (dry/irritated eyes.).     potassium chloride SA (KLOR-CON) 20 MEQ tablet Take 1 tablet (20 mEq total) by mouth every other day. (Patient not taking: Reported on 01/01/2022) 90 tablet 1   prednisoLONE acetate (PRED FORTE) 1 % ophthalmic suspension Apply to eye.      pregabalin (LYRICA) 75 MG capsule TAKE 1 CAPSULE(75 MG) BY MOUTH TWICE DAILY 180 capsule 2   RHOPRESSA 0.02 % SOLN Place 1 drop into the left eye at bedtime.     sitaGLIPtin-metformin (JANUMET) 50-500 MG tablet Take 1 tablet by mouth 2 (two) times daily with a meal. 90 tablet 1   traMADol (ULTRAM) 50 MG tablet Take by mouth. (Patient not taking: Reported on 01/01/2022)     Travoprost, BAK Free, (TRAVATAN) 0.004 % SOLN ophthalmic solution Place 1 drop into the right eye at bedtime.     umeclidinium-vilanterol (ANORO ELLIPTA) 62.5-25 MCG/ACT AEPB Inhale 1 puff into the lungs daily at 6 (six) AM. 1 each 12   No current facility-administered medications on file prior to visit.    Allergies  Allergen Reactions   Crestor [Rosuvastatin Calcium] Other (See Comments)    muscle aches   Demerol  [Meperidine Hcl]     Other reaction(s): Hallucinations   Shellfish Allergy Itching and Other (See Comments)    Crab, shrimp and lobster ---lips itch and tingle Was told not to eat again after having a allergy test. Lobster, crab and shrimp      Assessment/Plan:  1. Hypertension - BP of 118/64 is at goal of <130/80 on carvedilol 25 mg BID and Imdur 60 mg daily. Her stated home BP readings are questionable given her technique. She was counseled on proper technique. Her decrease in BP since her visit with Dr. Irish Lack is likely due to a combination of Coreg increase and stopping NSAIDS. Patient was educated on limiting salt in diet to <2g/day and exercising 150 minutes per week.  Continue carvedilol 25 mg BID, Imdur 60 mg QD, and Lasix  40 mg prn.  2. Hyperlipidemia - Patient's most recent LDL of 117 is above goal of <55 given ASCVD + DM, however she was not taking her statin at the time. She was counseled on mediterranean diet and limiting dietary cholesterol.   Continue atorvastatin 80 mg QD and ezetimibe 10 mg QD. Incorporate a heart-healthy diet.  Follow-Up:  Return to clinic 03/03/22 for BP cuff  validation and follow-up. If not completed before, will also need repeat lipid panel.  Thank you for allowing pharmacy to participate in this patient's care.  Reatha Harps, PharmD PGY2 Pharmacy Resident 01/20/2022 2:01 PM Check [email or web address] for unit specific pharmacy number

## 2022-01-20 ENCOUNTER — Ambulatory Visit: Payer: Medicare Other | Attending: Cardiology | Admitting: Pharmacist

## 2022-01-20 VITALS — BP 118/64 | HR 76

## 2022-01-20 DIAGNOSIS — I10 Essential (primary) hypertension: Secondary | ICD-10-CM | POA: Diagnosis not present

## 2022-01-20 DIAGNOSIS — E7800 Pure hypercholesterolemia, unspecified: Secondary | ICD-10-CM | POA: Diagnosis not present

## 2022-01-20 NOTE — Patient Instructions (Signed)
Bring your BP cuff and BP readings to your next appointment.

## 2022-01-21 ENCOUNTER — Other Ambulatory Visit: Payer: Self-pay | Admitting: Obstetrics and Gynecology

## 2022-01-21 DIAGNOSIS — R350 Frequency of micturition: Secondary | ICD-10-CM

## 2022-01-22 ENCOUNTER — Ambulatory Visit (INDEPENDENT_AMBULATORY_CARE_PROVIDER_SITE_OTHER): Payer: Medicare Other | Admitting: Internal Medicine

## 2022-01-22 ENCOUNTER — Encounter: Payer: Self-pay | Admitting: Internal Medicine

## 2022-01-22 VITALS — BP 120/76 | HR 78 | Temp 98.6°F | Ht 62.0 in | Wt 162.0 lb

## 2022-01-22 DIAGNOSIS — G8929 Other chronic pain: Secondary | ICD-10-CM | POA: Diagnosis not present

## 2022-01-22 DIAGNOSIS — E785 Hyperlipidemia, unspecified: Secondary | ICD-10-CM | POA: Diagnosis not present

## 2022-01-22 DIAGNOSIS — Z6829 Body mass index (BMI) 29.0-29.9, adult: Secondary | ICD-10-CM

## 2022-01-22 DIAGNOSIS — I5032 Chronic diastolic (congestive) heart failure: Secondary | ICD-10-CM | POA: Diagnosis not present

## 2022-01-22 DIAGNOSIS — M545 Low back pain, unspecified: Secondary | ICD-10-CM | POA: Diagnosis not present

## 2022-01-22 DIAGNOSIS — I25119 Atherosclerotic heart disease of native coronary artery with unspecified angina pectoris: Secondary | ICD-10-CM

## 2022-01-22 DIAGNOSIS — I110 Hypertensive heart disease with heart failure: Secondary | ICD-10-CM

## 2022-01-22 DIAGNOSIS — E1169 Type 2 diabetes mellitus with other specified complication: Secondary | ICD-10-CM | POA: Diagnosis not present

## 2022-01-22 MED ORDER — TIZANIDINE HCL 4 MG PO TABS: 4.0000 mg | ORAL_TABLET | Freq: Every day | ORAL | 1 refills | Status: DC

## 2022-01-22 NOTE — Patient Instructions (Signed)

## 2022-01-22 NOTE — Progress Notes (Signed)
[name],acting as a Education administrator for Maximino Greenland, MD.,have documented all relevant documentation on the behalf of Maximino Greenland, MD,as directed by  Maximino Greenland, MD while in the presence of Maximino Greenland, MD.    Subjective:     Patient ID: Sheila Reynolds , female    DOB: [birth year] , 76 y.o.   MRN: [id number]   Chief Complaint  Patient presents with   Diabetes   Hypertension    HPI  Patient presents today for DM/BP check.  She reports compliance with meds. She has not had any issues with her medication.  She denies having any new cp/sob/palpitations.  Patient reports having some mid back pain that has been worsening. She reports she has seen a pulmonary doctor but he won't do anything about the pain. She states he said the pulmonary fibrosis could contribute to the pain. She states she is due to have CT scan in Jan 2024.   Diabetes She presents for her follow-up diabetic visit. She has type 2 diabetes mellitus. Her disease course has been stable. There are no hypoglycemic associated symptoms. Pertinent negatives for hypoglycemia include no dizziness or headaches. Pertinent negatives for diabetes include no blurred vision, no chest pain, no polydipsia, no polyphagia, no polyuria and no weakness. There are no hypoglycemic complications. Diabetic complications include heart disease. Her weight is stable. Her breakfast blood glucose is taken between 7-8 am. Her breakfast blood glucose range is generally 90-110 mg/dl. An ACE inhibitor/angiotensin II receptor blocker is being taken. Eye exam is current.  Hypertension This is a chronic problem. The current episode started more than 1 year ago. The problem has been gradually improving since onset. The problem is controlled. Pertinent negatives include no blurred vision, chest pain, headaches, orthopnea, palpitations or shortness of breath. Risk factors for coronary artery disease include diabetes mellitus, dyslipidemia,  post-menopausal state and sedentary lifestyle. The current treatment provides moderate improvement. Compliance problems include exercise.      Past Medical History:  Diagnosis Date   Anemia    3 months ago anemic   Anxiety    on meds   Arthritis    "all over" (01/15/2017)   Asthma    Bronchitis with emphysema    Chest pain    Chronic bronchitis ([place])    Coronary artery disease    a. 01/2017 she underwent orbital atherectomy/DES to the proxmal LAD and PTCA to ostial D2. 2D Echo 01/15/17 showed mild LVH, EF 60-65%, grade 1 DD.   Family history of anesthesia complication    daughter N/V   Fibromyalgia    GERD (gastroesophageal reflux disease)    on meds   Glaucoma    Heart murmur    History of hiatal hernia    Hx of echocardiogram    Echo (03/2013):  Tech limited; Mild focal basal septal hypertrophy, EF 60-65%, normal RVF   Hyperlipidemia    Hypertension    Nonsustained ventricular tachycardia (HCC)    OSA on CPAP    Premature atrial contractions    PVC (premature ventricular contraction)    a. Holter 12/16: NSR, occ PAC,PVCs   Sarcoidosis    Type II diabetes mellitus ([place])      Family History  Problem Relation Age of Onset   Heart disease Father    Diabetes Father    Glaucoma Father    Cancer Mother        unknown type, ?lung   Heart disease Paternal Grandmother    Cancer  Paternal Grandmother        unknown type   Diabetes Brother    Glaucoma Brother      Current Outpatient Medications:    acetaminophen (TYLENOL) 500 MG tablet, Take 1,000 mg by mouth 2 (two) times daily as needed for moderate pain or headache., Disp: , Rfl:    albuterol (PROVENTIL) (2.5 MG/3ML) 0.083% nebulizer solution, Take 3 mLs (2.5 mg total) by nebulization daily as needed for wheezing or shortness of breath., Disp: 75 mL, Rfl: 12   albuterol (VENTOLIN HFA) 108 (90 Base) MCG/ACT inhaler, Inhale 2 puffs into the lungs every 6 (six) hours as needed for wheezing or shortness of breath., Disp: 18  g, Rfl: 12   ALPRAZolam (XANAX) 1 MG tablet, Take by mouth., Disp: , Rfl:    aspirin EC 81 MG tablet, Take 81 mg by mouth daily. Swallow whole., Disp: , Rfl:    atorvastatin (LIPITOR) 80 MG tablet, Take 1 tablet (80 mg total) by mouth daily., Disp: 90 tablet, Rfl: 3   azaTHIOprine (IMURAN) 50 MG tablet, Take by mouth., Disp: , Rfl:    azelastine (ASTELIN) 0.1 % nasal spray, Place 1 spray into both nostrils 2 (two) times daily. Use in each nostril as directed, Disp: 30 mL, Rfl: 1   Blood Glucose Monitoring Suppl (ACCU-CHEK AVIVA PLUS) w/Device KIT, Use to check blood sugars 3 times a day. Dx code e11.65, Disp: 1 kit, Rfl: 3   brimonidine (ALPHAGAN P) 0.1 % SOLN, Place 1 drop into both eyes 2 (two) times daily. , Disp: , Rfl:    brinzolamide (AZOPT) 1 % ophthalmic suspension, Place 1 drop into both eyes 3 (three) times daily., Disp: , Rfl:    carvedilol (COREG) 25 MG tablet, Take 1 tablet (25 mg total) by mouth 2 (two) times daily., Disp: 180 tablet, Rfl: 3   cetirizine (ZYRTEC ALLERGY) 10 MG tablet, Take 1 tablet (10 mg total) by mouth daily., Disp: 30 tablet, Rfl: 2   clopidogrel (PLAVIX) 75 MG tablet, TAKE 1 TABLET(75 MG) BY MOUTH DAILY, Disp: 90 tablet, Rfl: 3   EPINEPHrine 0.3 mg/0.3 mL IJ SOAJ injection, Inject 0.3 mg into the muscle as needed for anaphylaxis., Disp: 1 each, Rfl: 0   ezetimibe (ZETIA) 10 MG tablet, Take 1 tablet (10 mg total) by mouth daily., Disp: 90 tablet, Rfl: 3   isosorbide mononitrate (IMDUR) 60 MG 24 hr tablet, TAKE 1 TABLET(60 MG) BY MOUTH DAILY, Disp: 90 tablet, Rfl: 3   ketorolac (ACULAR) 0.5 % ophthalmic solution, Place 1 drop into the right eye 3 (three) times daily., Disp: , Rfl:    Lancets (ONETOUCH DELICA PLUS [id number]) MISC, CHECK BLOOD SUGAR BEFORE BREAKFAST AND DINNER, Disp: 100 each, Rfl: 1   montelukast (SINGULAIR) 10 MG tablet, TAKE 1 TABLET BY MOUTH EVERY EVENING, Disp: 90 tablet, Rfl: 1   Nebulizers (COMPRESSOR/NEBULIZER) MISC, Use as directed, Disp: 1  each, Rfl: 0   pantoprazole (PROTONIX) 40 MG tablet, TAKE 1 TABLET(40 MG) BY MOUTH DAILY, Disp: 90 tablet, Rfl: 3   Polyvinyl Alcohol-Povidone PF (REFRESH) 1.4-0.6 % SOLN, Place 1-2 drops into both eyes 3 (three) times daily as needed (dry/irritated eyes.)., Disp: , Rfl:    potassium chloride SA (KLOR-CON) 20 MEQ tablet, Take 1 tablet (20 mEq total) by mouth every other day., Disp: 90 tablet, Rfl: 1   prednisoLONE acetate (PRED FORTE) 1 % ophthalmic suspension, Apply to eye., Disp: , Rfl:    pregabalin (LYRICA) 75 MG capsule, TAKE 1 CAPSULE(75 MG) BY MOUTH  TWICE DAILY, Disp: 180 capsule, Rfl: 2   RHOPRESSA 0.02 % SOLN, Place 1 drop into the left eye at bedtime., Disp: , Rfl:    sitaGLIPtin-metformin (JANUMET) 50-500 MG tablet, Take 1 tablet by mouth 2 (two) times daily with a meal., Disp: 90 tablet, Rfl: 1   tiZANidine (ZANAFLEX) 4 MG tablet, Take 1 tablet (4 mg total) by mouth daily., Disp: 30 tablet, Rfl: 1   traMADol (ULTRAM) 50 MG tablet, Take by mouth., Disp: , Rfl:    Travoprost, BAK Free, (TRAVATAN) 0.004 % SOLN ophthalmic solution, Place 1 drop into the right eye at bedtime., Disp: , Rfl:    umeclidinium-vilanterol (ANORO ELLIPTA) 62.5-25 MCG/ACT AEPB, Inhale 1 puff into the lungs daily at 6 (six) AM., Disp: 1 each, Rfl: 12   benzonatate (TESSALON) 100 MG capsule, Take 1 capsule by mouth every 8 (eight) hours for cough., Disp: 21 capsule, Rfl: 0   furosemide (LASIX) 40 MG tablet, Take 1 tablet (40 mg total) by mouth daily as needed., Disp: 90 tablet, Rfl: 1   nitroGLYCERIN (NITROSTAT) 0.4 MG SL tablet, Place 1 tablet (0.4 mg total) under the tongue every 5 (five) minutes as needed for chest pain., Disp: 25 tablet, Rfl: prn   Allergies  Allergen Reactions   Crestor [Rosuvastatin Calcium] Other (See Comments)    muscle aches   Demerol  [Meperidine Hcl]     Other reaction(s): Hallucinations   Shellfish Allergy Itching and Other (See Comments)    Crab, shrimp and lobster ---lips itch and  tingle Was told not to eat again after having a allergy test. Lobster, crab and shrimp      Review of Systems  Constitutional: Negative.   Eyes: Negative.  Negative for blurred vision.  Respiratory: Negative.  Negative for shortness of breath.   Cardiovascular: Negative.  Negative for chest pain, palpitations and orthopnea.  Gastrointestinal: Negative.   Endocrine: Negative for polydipsia, polyphagia and polyuria.  Musculoskeletal:  Positive for back pain.  Skin: Negative.   Neurological: Negative.  Negative for dizziness, weakness and headaches.  Psychiatric/Behavioral: Negative.       Today's Vitals   01/22/22 1533  BP: 120/76  Pulse: 78  Temp: 98.6 F (37 C)  Weight: 162 lb (73.5 kg)  Height: 5' 2" (1.575 m)  PainSc: 6   PainLoc: Back   Body mass index is 29.63 kg/m.  Wt Readings from Last 3 Encounters:  01/22/22 162 lb (73.5 kg)  01/01/22 161 lb (73 kg)  11/27/21 165 lb 12.8 oz (75.2 kg)     Objective:  Physical Exam Vitals and nursing note reviewed.  Constitutional:      Appearance: Normal appearance.  HENT:     Head: Normocephalic and atraumatic.     Nose:     Comments: Masked     Mouth/Throat:     Comments: Masked  Eyes:     Extraocular Movements: Extraocular movements intact.  Cardiovascular:     Rate and Rhythm: Normal rate and regular rhythm.     Heart sounds: Normal heart sounds.  Pulmonary:     Effort: Pulmonary effort is normal.     Breath sounds: Normal breath sounds.  Musculoskeletal:        General: Tenderness present.     Cervical back: Normal range of motion.  Skin:    General: Skin is warm.  Neurological:     General: No focal deficit present.     Mental Status: She is alert.  Psychiatric:  Mood and Affect: Mood normal.        Behavior: Behavior normal.      Assessment And Plan:     1. Dyslipidemia associated with type 2 diabetes mellitus ([place]) Comments: Chronic, I will check labs as below. LDL goal is less than 70, on  statin therapy. - BMP8+EGFR - Hemoglobin A1c - Lipid panel - ALT  2. 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 heart failure ([place]) Comments: Chronic, she will c/w carvedilol twice daily and furosemide. Would likely benefit from SGLT2 inhibitor. - BMP8+EGFR  3. Chronic left-sided low back pain without sciatica Comments: I think her sx are due to musculoskeletal issue. I will send rx tizanidine to take nightly prn. She will let me know if her sx persist. - tiZANidine (ZANAFLEX) 4 MG tablet; Take 1 tablet (4 mg total) by mouth daily.  Dispense: 30 tablet; Refill: 1  4. BMI 29.0-29.9,adult Comments: She is encouraged to aim for at least 150 minutes of exercise per week. Her BMI is acceptable for her demographic.   Patient was given opportunity to ask questions. Patient verbalized understanding of the plan and was able to repeat key elements of the plan. All questions were answered to their satisfaction.   I, Maximino Greenland, MD, have reviewed all documentation for this visit. The documentation on 01/22/22 for the exam, diagnosis, procedures, and orders are all accurate and complete.   IF YOU HAVE BEEN REFERRED TO A SPECIALIST, IT MAY TAKE 1-2 WEEKS TO SCHEDULE/PROCESS THE REFERRAL. IF YOU HAVE NOT HEARD FROM US/SPECIALIST IN TWO WEEKS, PLEASE GIVE [place] A CALL AT [phone] X [phone].   THE PATIENT IS ENCOURAGED TO PRACTICE SOCIAL DISTANCING DUE TO THE COVID-19 PANDEMIC.

## 2022-01-23 ENCOUNTER — Other Ambulatory Visit: Payer: Medicare Other

## 2022-01-23 DIAGNOSIS — E1169 Type 2 diabetes mellitus with other specified complication: Secondary | ICD-10-CM | POA: Diagnosis not present

## 2022-01-23 DIAGNOSIS — I110 Hypertensive heart disease with heart failure: Secondary | ICD-10-CM | POA: Diagnosis not present

## 2022-01-23 DIAGNOSIS — E785 Hyperlipidemia, unspecified: Secondary | ICD-10-CM | POA: Diagnosis not present

## 2022-01-23 DIAGNOSIS — I5032 Chronic diastolic (congestive) heart failure: Secondary | ICD-10-CM | POA: Diagnosis not present

## 2022-01-24 LAB — BMP8+EGFR
BUN/Creatinine Ratio: 22 (ref 12–28)
BUN: 16 mg/dL (ref 8–27)
CO2: 24 mmol/L (ref 20–29)
Calcium: 9.3 mg/dL (ref 8.7–10.3)
Chloride: 102 mmol/L (ref 96–106)
Creatinine, Ser: 0.72 mg/dL (ref 0.57–1.00)
Glucose: 99 mg/dL (ref 70–99)
Potassium: 4.7 mmol/L (ref 3.5–5.2)
Sodium: 138 mmol/L (ref 134–144)
eGFR: 88 mL/min/{1.73_m2} (ref >59)

## 2022-01-24 LAB — HEMOGLOBIN A1C
Est. average glucose Bld gHb Est-mCnc: 131 mg/dL
Hgb A1c MFr Bld: 6.2 % — ABNORMAL HIGH (ref 4.8–5.6)

## 2022-01-24 LAB — LIPID PANEL
Chol/HDL Ratio: 2.4 ratio (ref 0.0–4.4)
Cholesterol, Total: 117 mg/dL (ref 100–199)
HDL: 48 mg/dL (ref >39)
LDL Chol Calc (NIH): 57 mg/dL (ref 0–99)
Triglycerides: 53 mg/dL (ref 0–149)
VLDL Cholesterol Cal: 12 mg/dL (ref 5–40)

## 2022-01-24 LAB — ALT: ALT: 12 IU/L (ref 0–32)

## 2022-01-30 ENCOUNTER — Other Ambulatory Visit: Payer: Self-pay

## 2022-01-30 ENCOUNTER — Encounter: Payer: Self-pay | Admitting: Emergency Medicine

## 2022-01-30 ENCOUNTER — Ambulatory Visit
Admission: EM | Admit: 2022-01-30 | Discharge: 2022-01-30 | Disposition: A | Discharge status: Home / Self Care | Payer: Medicare Other | Attending: Family Medicine | Admitting: Family Medicine

## 2022-01-30 DIAGNOSIS — J069 Acute upper respiratory infection, unspecified: Secondary | ICD-10-CM

## 2022-01-30 DIAGNOSIS — U071 COVID-19: Secondary | ICD-10-CM | POA: Diagnosis not present

## 2022-01-30 MED ORDER — BENZONATATE 100 MG PO CAPS: ORAL_CAPSULE | ORAL | 0 refills | Status: DC

## 2022-01-30 NOTE — Discharge Instructions (Signed)
You have been tested for COVID-19 today. °If your test returns positive, you will receive a phone call from [facility] regarding your results. °Negative test results are not called. °Both positive and negative results area always visible on MyChart. °If you do not have a MyChart account, sign up instructions are provided in your discharge papers. °Please do not hesitate to contact us should you have questions or concerns. ° °

## 2022-01-30 NOTE — ED Triage Notes (Signed)
Pt here for cough and congestion with body aches and fever x 3 days

## 2022-02-01 LAB — SARS CORONAVIRUS 2 (TAT 6-24 HRS): SARS Coronavirus 2: POSITIVE — AB

## 2022-02-04 NOTE — ED Provider Notes (Signed)
[place]   [id number] 01/30/22 Arrival Time: 1700  ASSESSMENT & PLAN:  1. COVID-19   2. Viral URI with cough    Discussed typical duration of viral illness. OTC symptom care as needed.  Meds ordered this encounter  Medications   benzonatate (TESSALON) 100 MG capsule    Sig: Take 1 capsule by mouth every 8 (eight) hours for cough.    Dispense:  21 capsule    Refill:  0      Follow-up Information     Glendale Chard, MD.   Specialty: Internal Medicine Why: As needed. Contact information: [address] [place] [address] [phone]                 Reviewed expectations re: course of current medical issues. Questions answered. Outlined signs and symptoms indicating need for more acute intervention. Understanding verbalized. After Visit Summary given.   SUBJECTIVE: History from: Patient. Sheila Reynolds is a 76 y.o. female. Reports: Pt here for cough and congestion with body aches and fever x 3 days  Denies: difficulty breathing. Normal PO intake without n/v/d.  OBJECTIVE:  Vitals:   01/30/22 1925  BP: 127/78  Pulse: 90  Resp: 18  Temp: 100.1 F (37.8 C)  TempSrc: Oral  SpO2: 95%    General appearance: alert; no distress Eyes: PERRLA; EOMI; conjunctiva normal HENT: [place]; AT; with nasal congestion Neck: supple  Lungs: speaks full sentences without difficulty; unlabored Extremities: no edema Skin: warm and dry Neurologic: normal gait Psychological: alert and cooperative; normal mood and affect  Labs: Results for orders placed or performed during the hospital encounter of 01/30/22  SARS CORONAVIRUS 2 (TAT 6-24 HRS) Anterior Nasal Swab   Specimen: Anterior Nasal Swab  Result Value Ref Range   SARS Coronavirus 2 POSITIVE (A) NEGATIVE   Labs Reviewed  SARS CORONAVIRUS 2 (TAT 6-24 HRS) - Abnormal; Notable for the following components:      Result Value   SARS Coronavirus 2 POSITIVE (*)    All other components  within normal limits  POCT INFLUENZA A/B     Allergies  Allergen Reactions   Crestor [Rosuvastatin Calcium] Other (See Comments)    muscle aches   Demerol  [Meperidine Hcl]     Other reaction(s): Hallucinations   Shellfish Allergy Itching and Other (See Comments)    Crab, shrimp and lobster ---lips itch and tingle Was told not to eat again after having a allergy test. Lobster, crab and shrimp     Past Medical History:  Diagnosis Date   Anemia    3 months ago anemic   Anxiety    on meds   Arthritis    "all over" (01/15/2017)   Asthma    Bronchitis with emphysema    Chest pain    Chronic bronchitis ([place])    Coronary artery disease    a. 01/2017 she underwent orbital atherectomy/DES to the proxmal LAD and PTCA to ostial D2. 2D Echo 01/15/17 showed mild LVH, EF 60-65%, grade 1 DD.   Family history of anesthesia complication    daughter N/V   Fibromyalgia    GERD (gastroesophageal reflux disease)    on meds   Glaucoma    Heart murmur    History of hiatal hernia    Hx of echocardiogram    Echo (03/2013):  Tech limited; Mild focal basal septal hypertrophy, EF 60-65%, normal RVF   Hyperlipidemia    Hypertension    Nonsustained ventricular tachycardia ([place])  OSA on CPAP    Premature atrial contractions    PVC (premature ventricular contraction)    a. Holter 12/16: NSR, occ PAC,PVCs   Sarcoidosis    Type II diabetes mellitus ([place])    Social History   Socioeconomic History   Marital status: Married    Spouse name: Gerald Stabs   Number of children: 2   Years of education: Not on file   Highest education level: Not on file  Occupational History   Occupation: unemployed    Employer: [employer]  Tobacco Use   Smoking status: Former    Packs/day: 1.00    Years: 4.00    Total pack years: 4.00    Types: Cigarettes    Quit date: 02/11/1980    Years since quitting: 42.0   Smokeless tobacco: Never  Vaping Use   Vaping Use: Never used  Substance and Sexual  Activity   Alcohol use: No   Drug use: No   Sexual activity: Yes  Other Topics Concern   Not on file  Social History Narrative   Lives with husband   Right hand   Drinks 2-3 cups caffeine daily   Social Determinants of Health   Financial Resource Strain: Low Risk  (04/04/2021)   Overall Financial Resource Strain (CARDIA)    Difficulty of Paying Living Expenses: Not hard at all  Food Insecurity: No Food Insecurity (04/04/2021)   Hunger Vital Sign    Worried About Running Out of Food in the Last Year: Never true    [place] in the Last Year: Never true  Transportation Needs: No Transportation Needs (04/04/2021)   PRAPARE - Hydrologist (Medical): No    Lack of Transportation (Non-Medical): No  Physical Activity: Inactive (04/04/2021)   Exercise Vital Sign    Days of Exercise per Week: 0 days    Minutes of Exercise per Session: 0 min  Stress: No Stress Concern Present (04/04/2021)   [place]    Feeling of Stress : Only a little  Social Connections: Not on file  Intimate Partner Violence: Not At Risk (03/18/2018)   Humiliation, Afraid, Rape, and Kick questionnaire    Fear of Current or Ex-Partner: No    Emotionally Abused: No    Physically Abused: No    Sexually Abused: No   Family History  Problem Relation Age of Onset   Heart disease Father    Diabetes Father    Glaucoma Father    Cancer Mother        unknown type, ?lung   Heart disease Paternal Grandmother    Cancer Paternal Grandmother        unknown type   Diabetes Brother    Glaucoma Brother    Past Surgical History:  Procedure Laterality Date   BREAST SURGERY     CARDIAC CATHETERIZATION  05/04/2007   reveals overall normal left ventricular systolic function. Ejection fraction 65-70%   CATARACT EXTRACTION W/PHACO Right 07/20/2013   Procedure: CATARACT EXTRACTION PHACO AND INTRAOCULAR LENS PLACEMENT (IOC);   Surgeon: Marylynn Pearson, MD;  Location: [facility];  Service: Ophthalmology;  Laterality: Right;   COLONOSCOPY W/ BIOPSIES AND POLYPECTOMY     COLONOSCOPY WITH PROPOFOL N/A 09/07/2014   Procedure: COLONOSCOPY WITH PROPOFOL;  Surgeon: Juanita Craver, MD;  Location: [facility];  Service: Endoscopy;  Laterality: N/A;   COLONOSCOPY WITH PROPOFOL N/A 01/10/2020   Procedure: COLONOSCOPY WITH PROPOFOL;  Surgeon: Juanita Craver,  MD;  Location: [facility];  Service: Endoscopy;  Laterality: N/A;   CORONARY ANGIOPLASTY WITH STENT PLACEMENT  01/15/2017   CORONARY ATHERECTOMY N/A 01/15/2017   Procedure: CORONARY ATHERECTOMY;  Surgeon: Nelva Bush, MD;  Location: [facility];  Service: Cardiovascular;  Laterality: N/A;   CORONARY BALLOON ANGIOPLASTY N/A 01/15/2017   Procedure: CORONARY BALLOON ANGIOPLASTY;  Surgeon: Nelva Bush, MD;  Location: [facility];  Service: Cardiovascular;  Laterality: N/A;   CORONARY STENT INTERVENTION N/A 01/15/2017   Procedure: CORONARY STENT INTERVENTION;  Surgeon: Nelva Bush, MD;  Location: [facility];  Service: Cardiovascular;  Laterality: N/A;   ESOPHAGOGASTRODUODENOSCOPY (EGD) WITH PROPOFOL N/A 09/07/2014   Procedure: ESOPHAGOGASTRODUODENOSCOPY (EGD) WITH PROPOFOL;  Surgeon: Juanita Craver, MD;  [facility];  Service: Endoscopy;  Laterality: N/A;   EXTERNAL EAR SURGERY Bilateral 1970s   tumors removed   EYE SURGERY Left 2019   cataract extraction    EYE SURGERY Right 02/09/2018   eyelid surgery    INTRAVASCULAR PRESSURE WIRE/FFR STUDY N/A 12/12/2016   Procedure: INTRAVASCULAR PRESSURE WIRE/FFR STUDY;  Surgeon: Nelva Bush, MD;  Location: [facility];  Service: Cardiovascular;  Laterality: N/A;   MINI SHUNT INSERTION Right 07/20/2013   Procedure: INSERTION OF GLAUCOMA FILTRATION DEVICE RIGHT EYE;  Surgeon: Marylynn Pearson, MD;  Location: [facility];  Service: Ophthalmology;  Laterality: Right;   MITOMYCIN C APPLICATION Right 1/77/1165    Procedure: MITOMYCIN C APPLICATION;  Surgeon: Marylynn Pearson, MD;  Location: [facility];  Service: Ophthalmology;  Laterality: Right;   MITOMYCIN C APPLICATION Right 7/90/3833   Procedure: MITOMYCIN C APPLICATION RIGHT EYE;  Surgeon: Marylynn Pearson, MD;  Location: [facility];  Service: Ophthalmology;  Laterality: Right;   PLACEMENT OF BREAST IMPLANTS Bilateral 1992   "took all my breast tissue out; put implants in;fibrocystic breast disease "   POLYPECTOMY  01/10/2020   Procedure: POLYPECTOMY;  Surgeon: Juanita Craver, MD;  [facility];  Service: Endoscopy;;   RIGHT/LEFT HEART CATH AND CORONARY ANGIOGRAPHY N/A 12/12/2016   Procedure: RIGHT/LEFT HEART CATH AND CORONARY ANGIOGRAPHY;  Surgeon: Nelva Bush, MD;  Location: [facility];  Service: Cardiovascular;  Laterality: N/A;   TONSILLECTOMY     TOTAL ABDOMINAL HYSTERECTOMY  1982   TRABECULECTOMY Right 02/21/2015   Procedure: TRABECULECTOMY WITH [facility] ON THE RIGHT EYE;  Surgeon: Marylynn Pearson, MD;  Location: [facility];  Service: Ophthalmology;  Laterality: Right;     Vanessa Kick, MD 02/04/22 [phone]

## 2022-02-14 IMAGING — CR DG CHEST 2V
2 series · 2 of 2 positions shown · non-contrast
Comparison: Chest radiograph 02/26/2021, chest CT 02/18/2021

CLINICAL DATA: Cough. Patient reports cough, congestion, and
shortness of breath for 1 week.

EXAM:
CHEST - 2 VIEW

[w chest pa]
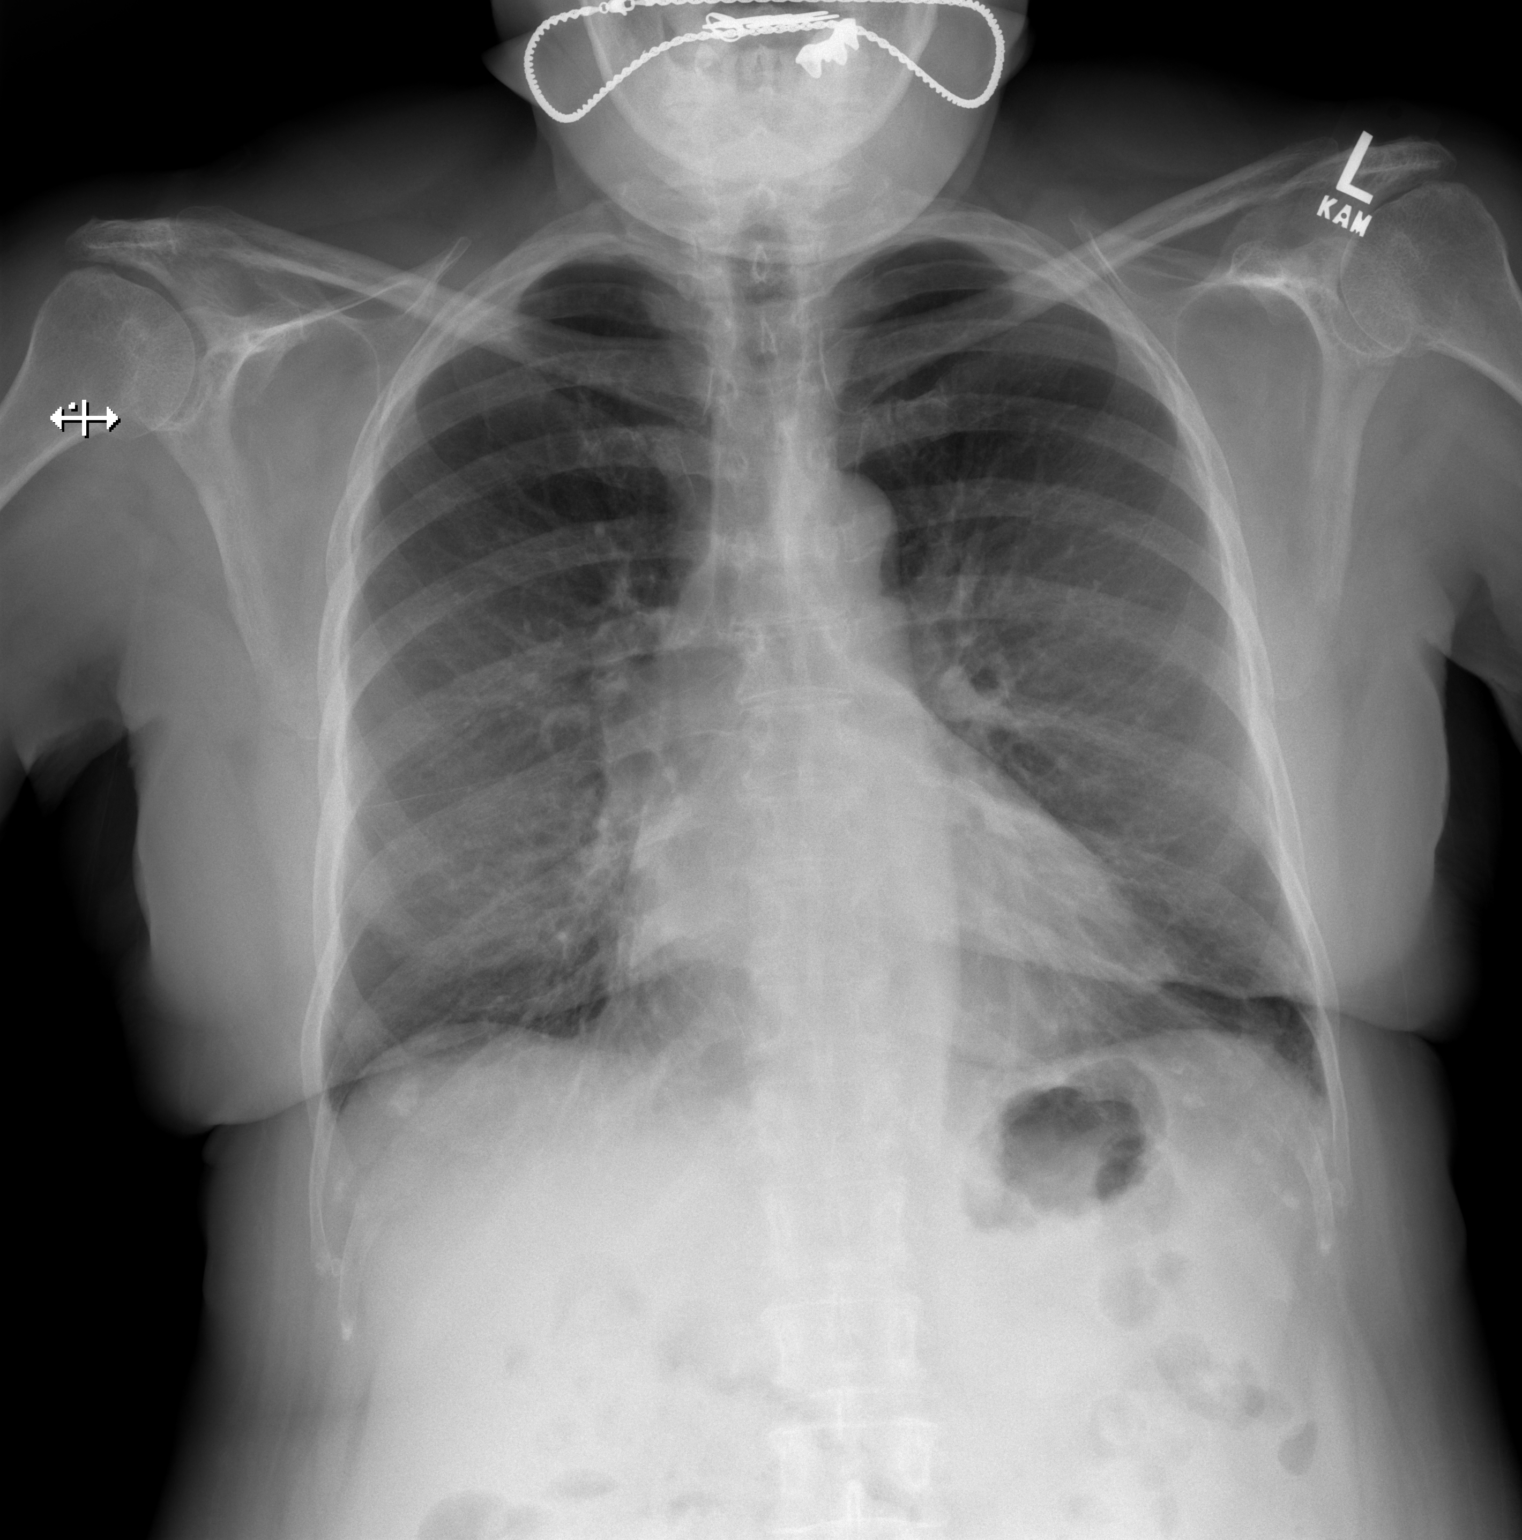

[w chest lat]
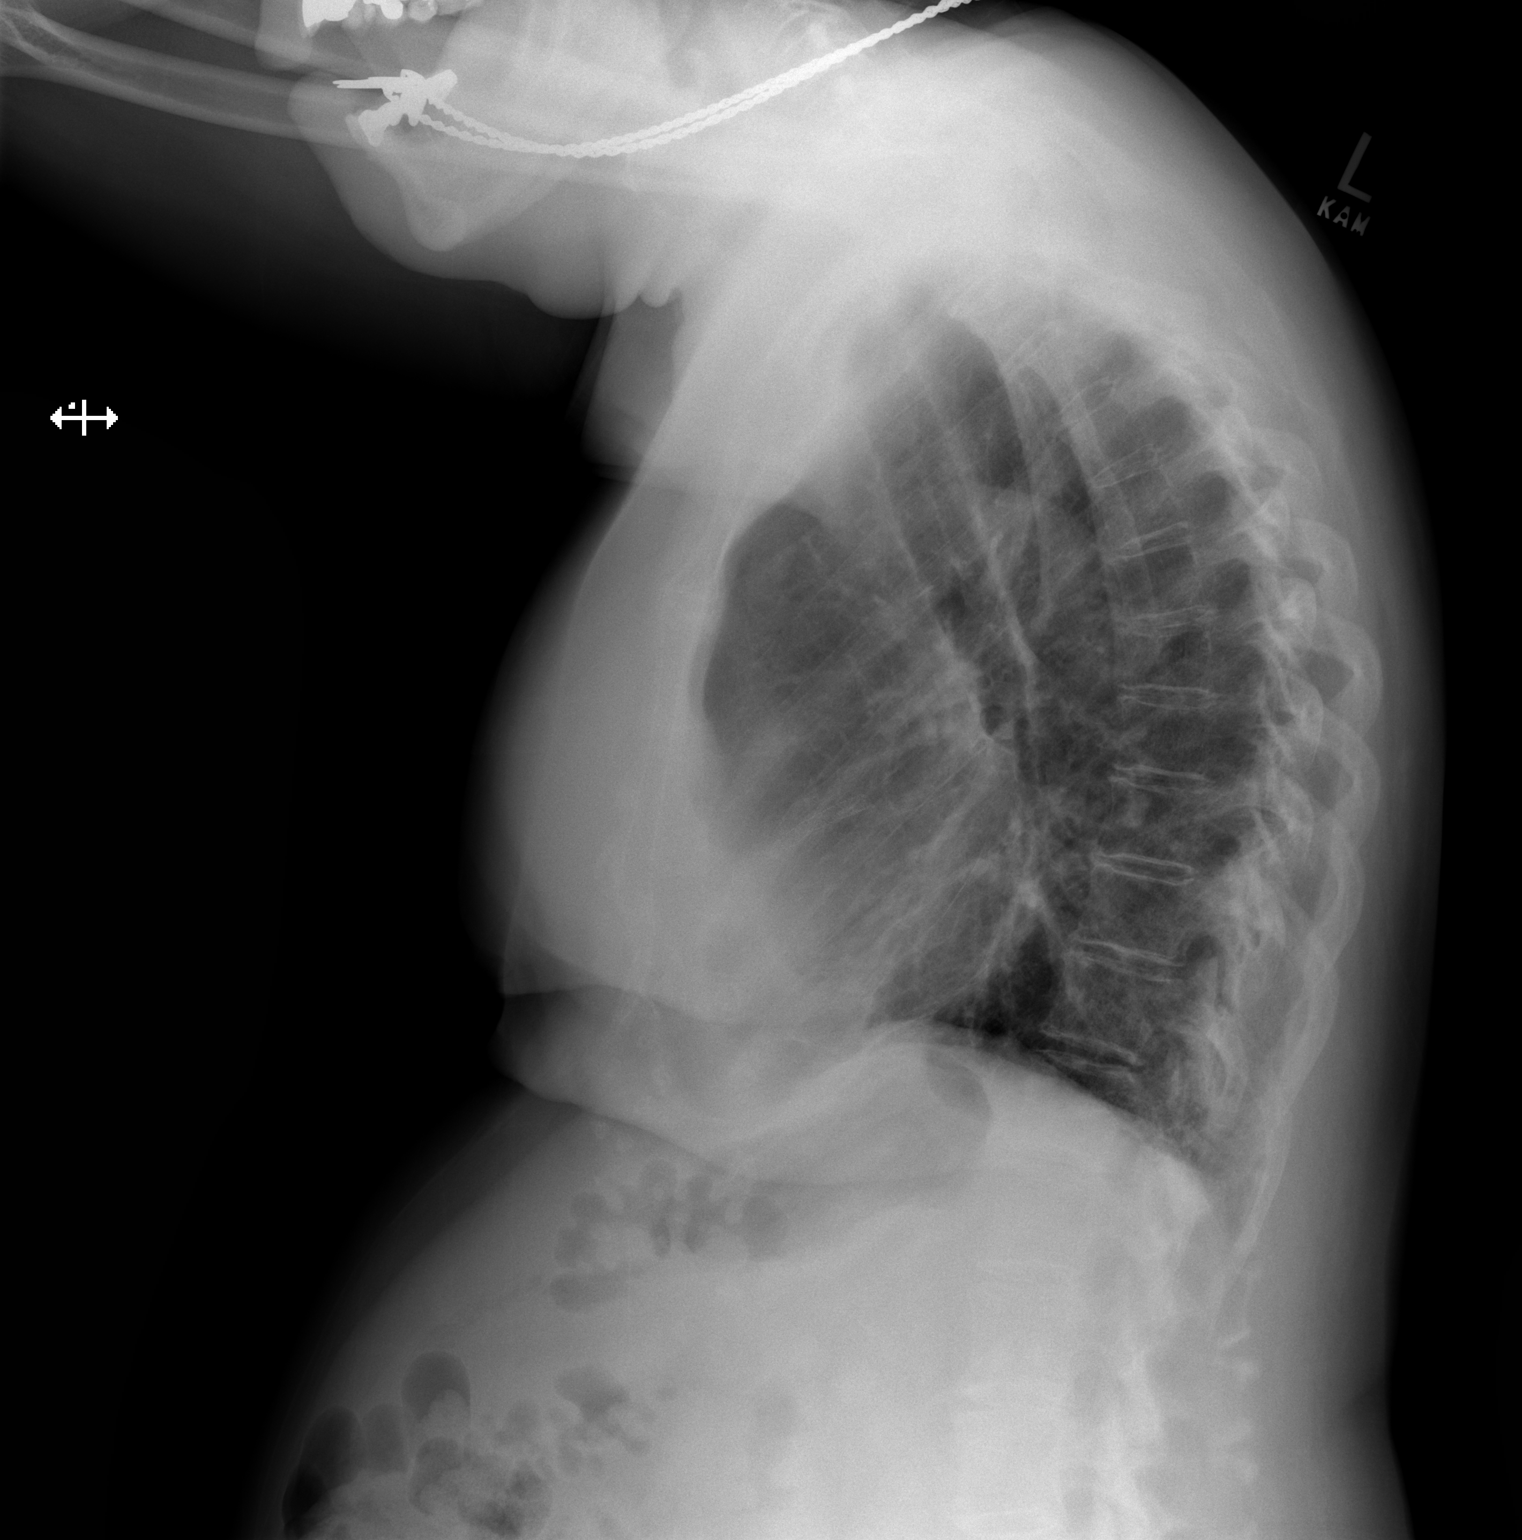

[2 of 2 positions shown; findings below may reference images not displayed]

FINDINGS: Stable heart size and mediastinal contours. No confluent airspace
disease. Interstitial lung disease on prior CT has no definite
radiographic correlate. No pulmonary edema, pleural effusion, or
pneumothorax. No acute osseous findings.
IMPRESSION: No acute chest findings. Interstitial lung disease on prior CT has
no definite radiographic correlate.

## 2022-02-17 ENCOUNTER — Other Ambulatory Visit: Payer: Self-pay | Admitting: Internal Medicine

## 2022-02-18 ENCOUNTER — Ambulatory Visit: Payer: Medicare Other

## 2022-02-18 ENCOUNTER — Ambulatory Visit (HOSPITAL_COMMUNITY)
Admission: RE | Admit: 2022-02-18 | Discharge: 2022-02-18 | Disposition: A | Discharge status: Home / Self Care | Payer: Medicare Other | Source: Ambulatory Visit | Attending: Internal Medicine | Admitting: Internal Medicine

## 2022-02-18 DIAGNOSIS — I2510 Atherosclerotic heart disease of native coronary artery without angina pectoris: Secondary | ICD-10-CM | POA: Diagnosis not present

## 2022-02-18 DIAGNOSIS — J439 Emphysema, unspecified: Secondary | ICD-10-CM | POA: Insufficient documentation

## 2022-02-18 DIAGNOSIS — I700 Atherosclerosis of aorta: Secondary | ICD-10-CM | POA: Diagnosis not present

## 2022-02-18 DIAGNOSIS — J849 Interstitial pulmonary disease, unspecified: Secondary | ICD-10-CM

## 2022-02-18 DIAGNOSIS — J84112 Idiopathic pulmonary fibrosis: Secondary | ICD-10-CM | POA: Diagnosis not present

## 2022-02-18 DIAGNOSIS — J479 Bronchiectasis, uncomplicated: Secondary | ICD-10-CM | POA: Insufficient documentation

## 2022-02-18 NOTE — Telephone Encounter (Signed)
Chmg-error.  

## 2022-02-18 NOTE — Progress Notes (Signed)
HPI  F former smoker followed for bronchitis, hx  Occular sarcoid, bronchitis/ nodules, OSA complicated by GERD, glaucoma, DM2 NPSG 12/25/03- AHI  2.9/ hr, desaturation to 91%, body weight 164 lbs NPSG-10/18/13- Mild OSA, AHI 9.3/ hr, weight 164 lbs  ACE 09/17/15-47-WNL ACE level 08/18/16-30 ACE level 05/14/20- 22 PFT: 01/07/2011-FEV1 1.59/79%, FEV1/FVC 0.73, FEF 25-75% improved to 38% with bronchodilator. TLC 90% DLCO 55% Office Spirometry 06/09/16- moderate restriction of exhaled volume. FVC 1.51/68%, FEV1 1.20/70%, ratio 0.79, FEF 25-75% 1.19/73% Nuclear Stress Test 09/04/16- EF 65%, low risk. ECHO 07/31/16- Gr 1 DD Walk test on room air 12/21/2017-minimal saturation 94%, maximum heart rate 118/minute with no stops walking 3 laps x180 m. Office Spirometry 12/21/17 PFT 04/13/20- Minimal Obstruction and Minimal Restriction, no resp to BD, Moderately severe DLCO defect WALK TEST 4/422- 2 laps, min O2 sat 94%, max HR 127.  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 Download-compliance Body weight today- Covid vax-3 Phizer Flu vax-had Skipping CPAP some recently, indicating general frustration and saying "sometimes I just do not care".  We discussed this and she indicated that she was going to use it more consistently.  Since initial AHI was low, I suggested we update her HST to see if she still has significant OSA at all.  If so, consider an alternative such as an oral  appliance. CT scan reviewed.  I told her the fibrotic changes in her lung currently seems stable compared with the last imaging but that we should continue to follow.  I do not believe these changes because the chest and back pains that have bothered her for a long time now. HRCT chest 08/14/21- IMPRESSION: 1. 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 [place], Iss 5, [phone], Oct 11 2016. 2. Aortic atherosclerosis (ICD10-I70.0). Coronary artery calcification. 3.  Emphysema (ICD10-J43.9).  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 Now on azathiprime by Dr Kathlene November -----States she has pain in her back that radiates to the front she states this has been happening for a while now. Haven't used cpap machine in a while Just resolving another COVID infection.  Coughing a lot productive of yellow over the past month.  We discussed antibiotics and sputum cultures for this. She has a follow-up with Dr. Vaughan Browner for ILD in 6 months. We reviewed chest CT 2 new nodules that look  inflammatory..  We will treat now for common bacterial bronchitis and follow  recommendation to repeat CT in 3 months to recheck the nodules. Up between 3 and 5 times at night because of bladder problems for which she has chosen no surgery.  She will have to follow-up with urology/GYN on this. This amount of disturbance is significantly interfering with CPAP use. Continues with back and chest pains which seem musculoskeletal/nerve root related.  She has seen Dr. Acie Fredrickson for cardiology. HRCT chest 02/18/22- IMPRESSION: 1. Clustered solid pulmonary nodules of the right upper lobe with associated bronchiectasis and two new solid pulmonary nodules measuring up to 6 mm, likely due to worsening infectious process. Follow-up chest CT is recommended in 3 months to ensure resolution. 2. 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 No evidence of progression when compared with most recent prior exam. 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 [place], Iss 5, [phone], Oct 11 2016. 3. Severe left main and three-vessel coronary artery calcifications. 4. Aortic Atherosclerosis (ICD10-I70.0) and Emphysema (ICD10-J43.9).    ROS-see HPI     + = positive Constitutional:   No-   weight loss, night sweats, fevers, chills, fatigue, lassitude. HEENT:   +  headaches, difficulty swallowing, tooth/dental problems, sore throat,       Some  sneezing, itching, ear ache, +nasal congestion, post nasal drip,  CV: + chest pain, no-orthopnea, PND, swelling in lower extremities, anasarca, dizziness, palpitations Resp: +  shortness of breath with exertion not at rest.            +productive cough,  + non-productive cough,  No- coughing up of blood.              + color of mucus.   wheezing.   Skin: No-   rash or  lesions. GI:  +  heartburn, indigestion, abdominal pain, nausea, vomiting,  GU:  MS:  +  joint pain or swelling, + back pain Neuro-     nothing unusual Psych:  No- change in mood or affect. No depression or anxiety.  No memory loss.  OBJ  Afebrile General- Alert, Oriented, Affect tearful, Distress- none acute,  Skin- rash-none, lesions- none, excoriation- none Lymphadenopathy- none Head- atraumatic            Eyes- Gross vision intact, PERRLA, conjunctivae clear secretions- not injected            Ears- Hearing aid             Nose-  turbinate edema, no-Septal dev, mucus, polyps, erosion, perforation             Throat- Mallampati III , mucosa  , drainage- none, tonsils- atrophic Neck- flexible , trachea midline, no stridor , thyroid nl, carotid no bruit Chest - symmetrical excursion , unlabored           Heart/CV- RRR , no murmur , no gallop  , no rub, nl s1 s2                           - JVD- none , edema- none, stasis changes- none, varices- none           Lung-  Clear/ trace crackles, Cough, wheeze-none, rhonchi-none , dullness-none, rub- none  Chest wall-  Abd- No HSM Br/ Gen/ Rectal- Not done, not indicated Extrem- cyanosis- none, clubbing, none, atrophy- none, strength- nl. +elastic hose Neuro- grossly intact to observation

## 2022-02-20 ENCOUNTER — Ambulatory Visit
Admission: RE | Admit: 2022-02-20 | Discharge: 2022-02-20 | Disposition: A | Discharge status: Home / Self Care | Payer: Medicare Other | Source: Ambulatory Visit | Attending: Obstetrics and Gynecology | Admitting: Obstetrics and Gynecology

## 2022-02-20 ENCOUNTER — Other Ambulatory Visit: Payer: Self-pay | Admitting: Obstetrics and Gynecology

## 2022-02-20 ENCOUNTER — Ambulatory Visit (INDEPENDENT_AMBULATORY_CARE_PROVIDER_SITE_OTHER): Payer: Medicare Other | Admitting: Internal Medicine

## 2022-02-20 ENCOUNTER — Encounter: Payer: Self-pay | Admitting: Internal Medicine

## 2022-02-20 VITALS — BP 122/78 | HR 70 | Ht 62.0 in | Wt 164.4 lb

## 2022-02-20 DIAGNOSIS — G4733 Obstructive sleep apnea (adult) (pediatric): Secondary | ICD-10-CM | POA: Diagnosis not present

## 2022-02-20 DIAGNOSIS — R918 Other nonspecific abnormal finding of lung field: Secondary | ICD-10-CM | POA: Diagnosis not present

## 2022-02-20 DIAGNOSIS — J4489 Other specified chronic obstructive pulmonary disease: Secondary | ICD-10-CM | POA: Diagnosis not present

## 2022-02-20 DIAGNOSIS — J42 Unspecified chronic bronchitis: Secondary | ICD-10-CM

## 2022-02-20 DIAGNOSIS — I25119 Atherosclerotic heart disease of native coronary artery with unspecified angina pectoris: Secondary | ICD-10-CM

## 2022-02-20 DIAGNOSIS — R350 Frequency of micturition: Secondary | ICD-10-CM

## 2022-02-20 DIAGNOSIS — J84112 Idiopathic pulmonary fibrosis: Secondary | ICD-10-CM

## 2022-02-20 DIAGNOSIS — R3911 Hesitancy of micturition: Secondary | ICD-10-CM | POA: Diagnosis not present

## 2022-02-20 MED ORDER — FLUCONAZOLE 150 MG PO TABS: 150.0000 mg | ORAL_TABLET | Freq: Every day | ORAL | 0 refills | Status: DC

## 2022-02-20 MED ORDER — DOXYCYCLINE HYCLATE 100 MG PO TABS: 100.0000 mg | ORAL_TABLET | Freq: Two times a day (BID) | ORAL | 0 refills | Status: DC

## 2022-02-20 NOTE — Patient Instructions (Addendum)
Order- referral to Dr Oneal Grout, orthodontist    consider oral appliance for OSA  Order- Sputum culture- routine C&S, Fungal, AFB     dx chronic bronchitis  Script sent for doxycycline antibiotic for bronchitis  Make f/u appt with Dr Vaughan Browner to check on the pulmonary fibrosis in 6 months

## 2022-02-21 ENCOUNTER — Other Ambulatory Visit: Payer: Self-pay | Admitting: Internal Medicine

## 2022-02-25 ENCOUNTER — Ambulatory Visit (INDEPENDENT_AMBULATORY_CARE_PROVIDER_SITE_OTHER): Payer: Medicare Other

## 2022-02-25 VITALS — BP 130/74 | HR 74 | Temp 97.8°F

## 2022-02-25 DIAGNOSIS — R768 Other specified abnormal immunological findings in serum: Secondary | ICD-10-CM | POA: Diagnosis not present

## 2022-02-25 DIAGNOSIS — D8686 Sarcoid arthropathy: Secondary | ICD-10-CM | POA: Diagnosis not present

## 2022-02-25 DIAGNOSIS — Z23 Encounter for immunization: Secondary | ICD-10-CM

## 2022-02-25 DIAGNOSIS — M0609 Rheumatoid arthritis without rheumatoid factor, multiple sites: Secondary | ICD-10-CM | POA: Diagnosis not present

## 2022-02-25 DIAGNOSIS — M1990 Unspecified osteoarthritis, unspecified site: Secondary | ICD-10-CM | POA: Diagnosis not present

## 2022-02-25 DIAGNOSIS — J8410 Pulmonary fibrosis, unspecified: Secondary | ICD-10-CM | POA: Diagnosis not present

## 2022-02-25 DIAGNOSIS — D869 Sarcoidosis, unspecified: Secondary | ICD-10-CM | POA: Diagnosis not present

## 2022-02-25 DIAGNOSIS — I890 Lymphedema, not elsewhere classified: Secondary | ICD-10-CM | POA: Diagnosis not present

## 2022-02-25 DIAGNOSIS — Z79899 Other long term (current) drug therapy: Secondary | ICD-10-CM | POA: Diagnosis not present

## 2022-02-25 DIAGNOSIS — M2550 Pain in unspecified joint: Secondary | ICD-10-CM | POA: Diagnosis not present

## 2022-02-25 NOTE — Progress Notes (Signed)
Cardiology Office Note:    Date:  02/26/2022   ID:  SIOBAHN WORSLEY, DOB 1946/02/04, MRN [id number]  PCP:  Glendale Chard, MD   [facility] Providers Cardiologist:  Mertie Moores, MD     Referring MD: Glendale Chard, MD   Chief Complaint: coronary artery calcifications on CT  History of Present Illness:    Sheila Reynolds is a very pleasant 77 y.o. female with a hx of HTN, CAD s/p orbital atherectomy/DES to prox LAD and PTCA to ostial D2, HLD, GERD, NSVT, chronic shortness of breath, sarcoidosis, venous insufficiency, palpitations, CVA, chronic HFpEF, type 2 DM, and OSA.   June 2018, 30-day monitor showed no significant arrhythmias.  She underwent cardiac catheterization 12/12/2016 which revealed significant single-vessel CAD with long calcified proximal/mid LAD stenosis of up to 70%, as well as 70% ostial D2 stenosis.  No significant disease involving codominant LCx and RCA.  Mild pulmonary hypertension, upper normal to mildly elevated left and right heart filling pressures, low normal Fick cardiac output/index. Consideration to return if symptoms persisteed for PCI to prox/mid LAD. She subsequently underwent rotational atherectomy and stent placement to proximal/mid LAD December 2018.  She had a normal stress test November 2021, continues to report atypical chest pain.   Last cardiology clinic visit was as a work-in with DOD, Dr. Irish Lack on 01/01/22 for elevated blood pressures over the previous 2 days. BP as high as 190/100, 177/117.  She was advised to increase exercise, reduce sodium intake, and increase carvedilol to 25 mg twice daily.  Was referred to Pharm D for management of lipids and BP.  Seen by Reatha Harps, [place] on 01/20/22. She was found not to be compliant to prescribed medical therapy and continuing to eat  diet high in fat and sodium. Had also been on regimen of NSAIDs per eye doctor. BP technique was not appropriate and she was checking BP prior to taking morning  medications. BP at that visit was 118/64 on carvedilol 25 mg BID and Imdur 60 mg daily. Advised to resume atorvastatin 80  mg in addition to Zetia 10 mg daily.   Today, she is here with her husband for follow-up. BP at home SBP 130-150, DBP 70-88 SOB with exertion  Walks at [facility] for 1-2 hours  has to stop to rest sometimes at 5 min, sometimes at 15 min Elevates legs when sleeping, used to have significant improvement now not as much Lasix every other or every 3 rd day 2/2 cramps Occasional palps feels it in her throat Weakness Not compliant with CPAP - seeking   Past Medical History:  Diagnosis Date   Anemia    3 months ago anemic   Anxiety    on meds   Arthritis    "all over" (01/15/2017)   Asthma    Bronchitis with emphysema    Chest pain    Chronic bronchitis ([place])    Coronary artery disease    a. 01/2017 she underwent orbital atherectomy/DES to the proxmal LAD and PTCA to ostial D2. 2D Echo 01/15/17 showed mild LVH, EF 60-65%, grade 1 DD.   Family history of anesthesia complication    daughter N/V   Fibromyalgia    GERD (gastroesophageal reflux disease)    on meds   Glaucoma    Heart murmur    History of hiatal hernia    Hx of echocardiogram    Echo (03/2013):  Tech limited; Mild focal basal septal hypertrophy, EF 60-65%, normal RVF   Hyperlipidemia  Hypertension    Nonsustained ventricular tachycardia (HCC)    OSA on CPAP    Premature atrial contractions    PVC (premature ventricular contraction)    a. Holter 12/16: NSR, occ PAC,PVCs   Sarcoidosis    Type II diabetes mellitus ([place])     Past Surgical History:  Procedure Laterality Date   BREAST SURGERY     CARDIAC CATHETERIZATION  05/04/2007   reveals overall normal left ventricular systolic function. Ejection fraction 65-70%   CATARACT EXTRACTION W/PHACO Right 07/20/2013   Procedure: CATARACT EXTRACTION PHACO AND INTRAOCULAR LENS PLACEMENT (IOC);  Surgeon: Marylynn Pearson, MD;  Location: [place];  Service:  Ophthalmology;  Laterality: Right;   COLONOSCOPY W/ BIOPSIES AND POLYPECTOMY     COLONOSCOPY WITH PROPOFOL N/A 09/07/2014   Procedure: COLONOSCOPY WITH PROPOFOL;  Surgeon: Juanita Craver, MD;  [facility]: [facility];  Service: Endoscopy;  Laterality: N/A;   COLONOSCOPY WITH PROPOFOL N/A 01/10/2020   Procedure: COLONOSCOPY WITH PROPOFOL;  Surgeon: Juanita Craver, MD;  [facility];  Service: Endoscopy;  Laterality: N/A;   CORONARY ANGIOPLASTY WITH STENT PLACEMENT  01/15/2017   CORONARY ATHERECTOMY N/A 01/15/2017   Procedure: CORONARY ATHERECTOMY;  Surgeon: Nelva Bush, MD;  Location: [place] [facility];  Service: Cardiovascular;  Laterality: N/A;   CORONARY BALLOON ANGIOPLASTY N/A 01/15/2017   Procedure: CORONARY BALLOON ANGIOPLASTY;  Surgeon: Nelva Bush, MD;  Location: [place] [facility];  Service: Cardiovascular;  Laterality: N/A;   CORONARY STENT INTERVENTION N/A 01/15/2017   Procedure: CORONARY STENT INTERVENTION;  Surgeon: Nelva Bush, MD;  Location: [place] [facility];  Service: Cardiovascular;  Laterality: N/A;   ESOPHAGOGASTRODUODENOSCOPY (EGD) WITH PROPOFOL N/A 09/07/2014   Procedure: ESOPHAGOGASTRODUODENOSCOPY (EGD) WITH PROPOFOL;  Surgeon: Juanita Craver, MD;  [facility];  Service: Endoscopy;  Laterality: N/A;   EXTERNAL EAR SURGERY Bilateral 1970s   tumors removed   EYE SURGERY Left 2019   cataract extraction    EYE SURGERY Right 02/09/2018   eyelid surgery    INTRAVASCULAR PRESSURE WIRE/FFR STUDY N/A 12/12/2016   Procedure: INTRAVASCULAR PRESSURE WIRE/FFR STUDY;  Surgeon: Nelva Bush, MD;  Location: [place] [facility];  Service: Cardiovascular;  Laterality: N/A;   MINI SHUNT INSERTION Right 07/20/2013   Procedure: INSERTION OF GLAUCOMA FILTRATION DEVICE RIGHT EYE;  Surgeon: Marylynn Pearson, MD;  Location: [place];  Service: Ophthalmology;  Laterality: Right;   MITOMYCIN C APPLICATION Right 3/84/5364   Procedure: MITOMYCIN C APPLICATION;  Surgeon: Marylynn Pearson, MD;  Location: [place];  Service: Ophthalmology;  Laterality: Right;   MITOMYCIN C APPLICATION Right 6/80/3212   Procedure: MITOMYCIN C APPLICATION RIGHT EYE;  Surgeon: Marylynn Pearson, MD;  Location: [place];  Service: Ophthalmology;  Laterality: Right;   PLACEMENT OF BREAST IMPLANTS Bilateral 1992   "took all my breast tissue out; put implants in;fibrocystic breast disease "   POLYPECTOMY  01/10/2020   Procedure: POLYPECTOMY;  Surgeon: Juanita Craver, MD;  [facility];  Service: Endoscopy;;   RIGHT/LEFT HEART CATH AND CORONARY ANGIOGRAPHY N/A 12/12/2016   Procedure: RIGHT/LEFT HEART CATH AND CORONARY ANGIOGRAPHY;  Surgeon: Nelva Bush, MD;  Location: [place] [facility];  Service: Cardiovascular;  Laterality: N/A;   TONSILLECTOMY     TOTAL ABDOMINAL HYSTERECTOMY  1982   TRABECULECTOMY Right 02/21/2015   Procedure: TRABECULECTOMY WITH [facility] ON THE RIGHT EYE;  Surgeon: Marylynn Pearson, MD;  Location: [place];  Service: Ophthalmology;  Laterality: Right;    Current Medications: Current Meds  Medication Sig   acetaminophen (TYLENOL) 500  MG tablet Take 1,000 mg by mouth 2 (two) times daily as needed for moderate pain or headache.   albuterol (PROVENTIL) (2.5 MG/3ML) 0.083% nebulizer solution Take 3 mLs (2.5 mg total) by nebulization daily as needed for wheezing or shortness of breath.   albuterol (VENTOLIN HFA) 108 (90 Base) MCG/ACT inhaler Inhale 2 puffs into the lungs every 6 (six) hours as needed for wheezing or shortness of breath.   ALPRAZolam (XANAX) 1 MG tablet Take by mouth.   ANORO ELLIPTA 62.5-25 MCG/ACT AEPB INHALE 1 PUFF INTO THE LUNGS DAILY AT 6 AM   aspirin EC 81 MG tablet Take 81 mg by mouth daily. Swallow whole.   atorvastatin (LIPITOR) 80 MG tablet Take 1 tablet (80 mg total) by mouth daily.   azaTHIOprine (IMURAN) 50 MG tablet Take by mouth.   azelastine (ASTELIN) 0.1 % nasal spray Place 1 spray into both nostrils 2 (two) times daily. Use in each nostril as directed    benzonatate (TESSALON) 100 MG capsule Take 1 capsule by mouth every 8 (eight) hours for cough.   Blood Glucose Monitoring Suppl (ACCU-CHEK AVIVA PLUS) w/Device KIT Use to check blood sugars 3 times a day. Dx code e11.65   brimonidine (ALPHAGAN P) 0.1 % SOLN Place 1 drop into both eyes 2 (two) times daily.    brinzolamide (AZOPT) 1 % ophthalmic suspension Place 1 drop into both eyes 3 (three) times daily.   carvedilol (COREG) 25 MG tablet Take 1 tablet (25 mg total) by mouth 2 (two) times daily.   cetirizine (ZYRTEC ALLERGY) 10 MG tablet Take 1 tablet (10 mg total) by mouth daily.   clopidogrel (PLAVIX) 75 MG tablet TAKE 1 TABLET(75 MG) BY MOUTH DAILY   doxycycline (VIBRA-TABS) 100 MG tablet Take 1 tablet (100 mg total) by mouth 2 (two) times daily.   EPINEPHrine 0.3 mg/0.3 mL IJ SOAJ injection Inject 0.3 mg into the muscle as needed for anaphylaxis.   ezetimibe (ZETIA) 10 MG tablet Take 1 tablet (10 mg total) by mouth daily.   fluconazole (DIFLUCAN) 150 MG tablet Take 1 tablet (150 mg total) by mouth daily.   furosemide (LASIX) 40 MG tablet Take 1 tablet (40 mg total) by mouth daily as needed.   isosorbide mononitrate (IMDUR) 60 MG 24 hr tablet TAKE 1 TABLET(60 MG) BY MOUTH DAILY   ketorolac (ACULAR) 0.5 % ophthalmic solution Place 1 drop into the right eye 3 (three) times daily.   Lancets (ONETOUCH DELICA PLUS QMVHQI69G) MISC CHECK BLOOD SUGAR BEFORE BREAKFAST AND DINNER   montelukast (SINGULAIR) 10 MG tablet TAKE 1 TABLET BY MOUTH EVERY DAY   Nebulizers (COMPRESSOR/NEBULIZER) MISC Use as directed   pantoprazole (PROTONIX) 40 MG tablet TAKE 1 TABLET(40 MG) BY MOUTH DAILY   Polyvinyl Alcohol-Povidone PF (REFRESH) 1.4-0.6 % SOLN Place 1-2 drops into both eyes 3 (three) times daily as needed (dry/irritated eyes.).   potassium chloride SA (KLOR-CON) 20 MEQ tablet Take 1 tablet (20 mEq total) by mouth every other day.   prednisoLONE acetate (PRED FORTE) 1 % ophthalmic suspension Apply to eye.    pregabalin (LYRICA) 75 MG capsule TAKE 1 CAPSULE(75 MG) BY MOUTH TWICE DAILY   RHOPRESSA 0.02 % SOLN Place 1 drop into the left eye at bedtime.   sitaGLIPtin-metformin (JANUMET) 50-500 MG tablet Take 1 tablet by mouth 2 (two) times daily with a meal.   tiZANidine (ZANAFLEX) 4 MG tablet Take 1 tablet (4 mg total) by mouth daily.   traMADol (ULTRAM) 50 MG tablet Take by mouth.  Travoprost, BAK Free, (TRAVATAN) 0.004 % SOLN ophthalmic solution Place 1 drop into the right eye at bedtime.     Allergies:   Crestor [rosuvastatin calcium], Demerol  [meperidine hcl], and Shellfish allergy   Social History   Socioeconomic History   Marital status: Married    Spouse name: Gerald Stabs   Number of children: 2   Years of education: Not on file   Highest education level: Not on file  Occupational History   Occupation: unemployed    Employer: [employer]  Tobacco Use   Smoking status: Former    Packs/day: 1.00    Years: 4.00    Total pack years: 4.00    Types: Cigarettes    Quit date: 02/11/1980    Years since quitting: 42.0   Smokeless tobacco: Never  Vaping Use   Vaping Use: Never used  Substance and Sexual Activity   Alcohol use: No   Drug use: No   Sexual activity: Yes  Other Topics Concern   Not on file  Social History Narrative   Lives with husband   Right hand   Drinks 2-3 cups caffeine daily   Social Determinants of Health   Financial Resource Strain: Low Risk  (04/04/2021)   Overall Financial Resource Strain (CARDIA)    Difficulty of Paying Living Expenses: Not hard at all  Food Insecurity: No Food Insecurity (04/04/2021)   Hunger Vital Sign    Worried About Running Out of Food in the Last Year: Never true    [place] in the Last Year: Never true  Transportation Needs: No Transportation Needs (04/04/2021)   PRAPARE - Hydrologist (Medical): No    Lack of Transportation (Non-Medical): No  Physical Activity: Inactive (04/04/2021)    Exercise Vital Sign    Days of Exercise per Week: 0 days    Minutes of Exercise per Session: 0 min  Stress: No Stress Concern Present (04/04/2021)   [place]    Feeling of Stress : Only a little  Social Connections: Not on file     Family History: The patient's family history includes Cancer in her mother and paternal grandmother; Diabetes in her brother and father; Glaucoma in her brother and father; Heart disease in her father and paternal grandmother.  ROS:   Please see the history of present illness.    + chest pain + bilateral LE edema + palpitations + chronic DOE All other systems reviewed and are negative.  Labs/Other Studies Reviewed:    The following studies were reviewed today:  Echo 09/05/21 1. Left ventricular ejection fraction, by estimation, is 60 to 65%. The  left ventricle has normal function. The left ventricle has no regional  wall motion abnormalities. There is mild left ventricular hypertrophy.  Left ventricular diastolic parameters  are consistent with Grade I diastolic dysfunction (impaired relaxation).   2. Right ventricular systolic function is normal. The right ventricular  size is normal.   3. The mitral valve is normal in structure. No evidence of mitral valve  regurgitation. No evidence of mitral stenosis.   4. The aortic valve is normal in structure. Aortic valve regurgitation is  not visualized. No aortic stenosis is present.   5. The inferior vena cava is normal in size with greater than 50%  respiratory variability, suggesting right atrial pressure of 3 mmHg.   Comparison(s): 02/08/19 EF 60-65%.  Cardiac Monitor 02/02/20 Sinus rhythm Rare PVCs No evidence  of ventricular tachycardia  Lexiscan Myoview 12/20/19 Nuclear stress EF: 67%. There was no ST segment deviation noted during stress. The study is normal. This is a low risk study. The left ventricular ejection  fraction is hyperdynamic (>65%).  R/LHC 12/12/2016 Conclusions: Significant single-vessel coronary artery disease with long, calcified proximal/mid LAD stenosis of up to 70% (resting PD/PA 0.79), as well as 70% ostial D2 stenosis. No significant disease involving codominant LCx and RCA. Upper normal to mildly elevated left and right heart filling pressures. Mild pulmonary hypertension. Low normal Fick cardiac output/index.   Recommendations: Optimize medical therapy, including addition of isosorbide mononitrate and clopidogrel. Patient should follow-up in 1-2 weeks with Dr. Acie Fredrickson or Truitt Merle to reassess her symptoms.  If she continues to have significant shortness of breath and/or dyspnea, PCI to the proximal/mid LAD with orbital atherectomy should be considered. Aggressive secondary prevention.    Recent Labs: 06/13/2021: TSH 0.892 10/15/2021: Platelets 224 10/23/2021: Hemoglobin 12.0 01/23/2022: ALT 12; BUN 16; Creatinine, Ser 0.72; Potassium 4.7; Sodium 138  Recent Lipid Panel    Component Value Date/Time   CHOL 117 01/23/2022 1252   TRIG 53 01/23/2022 1252   HDL 48 01/23/2022 1252   CHOLHDL 2.4 01/23/2022 1252   CHOLHDL 3 05/24/2014 1650   VLDL 20.8 05/24/2014 1650   LDLCALC 57 01/23/2022 1252     Risk Assessment/Calculations:       Physical Exam:    VS:  BP 130/72   Pulse 73   Ht '5\' 2"'$  (1.575 m)   Wt 162 lb (73.5 kg)   PF 99 L/min   BMI 29.63 kg/m     Wt Readings from Last 3 Encounters:  02/26/22 162 lb (73.5 kg)  02/20/22 164 lb 6.4 oz (74.6 kg)  01/22/22 162 lb (73.5 kg)     GEN:  Well nourished, well developed in no acute distress HEENT: Normal NECK: No JVD; No carotid bruits CARDIAC: RRR, no murmurs, rubs, gallops RESPIRATORY:  Clear to auscultation without rales, wheezing or rhonchi  ABDOMEN: Soft, non-tender, non-distended MUSCULOSKELETAL: Bilateral LE edema, L>R. No deformity. 2+ pedal pulses, equal bilaterally SKIN: Warm and dry NEUROLOGIC:   Alert and oriented x 3 PSYCHIATRIC:  Normal affect   EKG:  EKG is not ordered today.    Diagnoses:    1. Coronary artery disease of native artery of native heart with stable angina pectoris ([place])   2. Hypertension, unspecified type   3. Hyperlipidemia LDL goal <70   4. Chest pain of uncertain etiology   5. 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 heart failure ([place])   6. Pulmonary fibrosis ([place])   7. PVC's (premature ventricular contractions)   8. NSVT (nonsustained ventricular tachycardia) (HCC)   9. OSA (obstructive sleep apnea)    Assessment and Plan:     CAD without angina: History of orbital atherectomy and PCI/DES to prox/mid LAD 2018. Pulmonologist encouraged her to follow-up for severe left main and three-vessel coronary artery calcifications seen on CT of chest 02/18/2022. Symptoms of back pain that radiates to her chest and worsens with exertion. Not specifically associated with shortness of breath, but she has chronic DOE. No n/v, diaphoresis, presyncope, syncope. Does not recall symptom of angina prior to stenting in 2018. No bleeding problems on clopidogrel, asa. We will get PET cardiac scan for evaluation of worsening coronary artery disease. Continue GDMT including carvedilol, clopidogrel, ezetimibe, isosorbide, aspirin.   Hypertension: BP is well controlled. Home BP has been higher on occasion. Husband used a manual  cuff on one occasion and it correlated with her wrist cuff. Not clear if she is following appropriate technique.  Asked her to bring her cuff to appointment with Pharm.D. in 1 month and gave her instructions to call prior to appointment if BP consistently > 140 or > 88. No medication changes today. Return for hypertension clinic visit in 1 month.   Chronic HFpEF: Normal LVEF 60 to 65%, G1 DD on echo 09/05/2021. Has chronic bilateral LE edema that improves some with leg elevation overnight. Chronic DOE. No orthopnea, PND. Weight is stable.  Encouraged continued low sodium diet, leg elevation, and regular physical activity.   Hyperlipidemia LDL goal < 70: LDL 117 on 01/23/2022. Admitted noncompliance with atorvastatin at visit with [facility] on 01/20/22. Advised to resume atorvastatin 80 mg daily and have lipids rechecked in 1-2 months. She is scheduled for fasting lipid on 03/03/22.   Pulmonary fibrosis: Diagnosed by pulmonologist 08/2021.  CT of chest 02/18/2022 reveals new nodules likely from inflammation or infection, stable interstitial lung disease, recommendation to repeat CT in 3 months. Feels that her chronic dyspnea is stable. Distance/time that she can walk without stopping to rest varies, but she continues to consistently walks for exercise. Management per pulmonology.   Palpitations: Occasional palpitations that she feels up in her throat. Not as frequent as in the past. Rare PVCs on cardiac monitor 01/2020. Continue carvedilol.   Sleep apnea: Is not always compliant with CPAP. Has been referred for consideration of oral device. Emphasized the importance of compliance due to risk of a fib with untreated sleep apnea.   NSVT: This diagnosis goes back pre EMR. No evidence of VT on cardiac monitor 02/02/2020. No presyncope, syncope.   Shared Decision Making/Informed Consent The risks [chest pain, shortness of breath, cardiac arrhythmias, dizziness, blood pressure fluctuations, myocardial infarction, stroke/transient ischemic attack, nausea, vomiting, allergic reaction, radiation exposure, metallic taste sensation and life-threatening complications (estimated to be 1 in 10,000)], benefits (risk stratification, diagnosing coronary artery disease, treatment guidance) and alternatives of a cardiac PET stress test were discussed in detail with Ms. Mathena and she agrees to proceed.   Disposition: 3 months with Dr. Acie Fredrickson or me  Medication Adjustments/Labs and Tests Ordered: Current medicines are reviewed at length with the patient today.   Concerns regarding medicines are outlined above.  Orders Placed This Encounter  Procedures   NM PET CT CARDIAC PERFUSION MULTI W/ABSOLUTE BLOODFLOW   Lipid Profile   No orders of the defined types were placed in this encounter.   Patient Instructions  Medication Instructions:   Your physician recommends that you continue on your current medications as directed. Please refer to the Current Medication list given to you today.   *If you need a refill on your cardiac medications before your next appointment, please call your pharmacy*   Lab Work:  Your physician recommends that you return for a FASTING lipid profile on Monday, January 22. You can come in on the day of your appointment anytime between 7:30-4:30 fasting from midnight the night before.    If you have labs (blood work) drawn today and your tests are completely normal, you will receive your results only by: [place] (if you have MyChart) OR A paper copy in the mail If you have any lab test that is abnormal or we need to change your treatment, we will call you to review the results.   Testing/Procedures:  How to Prepare for Your Cardiac PET/CT Stress Test:  1. Please do not take  these medications before your test:   Medications that may interfere with the cardiac pharmacological stress agent (ex. nitrates - including isosorbide mononitrate or beta-blockers) the day of the exam. Your remaining medications may be taken with water. HOLD IMDUR AND COREG AM OF TEST.  2. Nothing to eat or drink, except water, 3 hours prior to arrival time.   NO caffeine/decaffeinated products, or chocolate 12 hours prior to arrival.  3. NO perfume  or lotion  4. Total time is 1 to 2 hours; you may want to bring reading material for the waiting time.  5. Please report to Admitting at the [facility] Main Entrance 30 minutes early for your test.  [place], [place] [address]  Diabetic Preparation:  Hold  oral medications.(Janumet) Check blood sugars prior to leaving the house. If able to eat breakfast prior to 3 hour fasting, you may take all medications, including your insulin,  In preparation for your appointment, medication and supplies will be purchased.  Appointment availability is limited, so if you need to cancel or reschedule, please call the Radiology Department at [phone]  24 hours in advance to avoid a cancellation fee of $100.00  What to Expect After you Arrive:  Once you arrive and check in for your appointment, you will be taken to a preparation room within the Radiology Department.  A technologist or Nurse will obtain your medical history, verify that you are correctly prepped for the exam, and explain the procedure.  Afterwards,  an IV will be started in your arm and electrodes will be placed on your skin for EKG monitoring during the stress portion of the exam. Then you will be escorted to the PET/CT scanner.  There, staff will get you positioned on the scanner and obtain a blood pressure and EKG.  During the exam, you will continue to be connected to the EKG and blood pressure machines.  A small, safe amount of a radioactive tracer will be injected in your IV to obtain a series of pictures of your heart along with an injection of a stress agent.    After your Exam:  It is recommended that you eat a meal and drink a caffeinated beverage to counter act any effects of the stress agent.  Drink plenty of fluids for the remainder of the day and urinate frequently for the first couple of hours after the exam.  Your doctor will inform you of your test results within 7-10 business days.  For questions about your test or how to prepare for your test, please call: Marchia Bond, Cardiac Imaging Nurse Navigator  Gordy Clement, Cardiac Imaging Nurse Navigator Office: [phone]    Follow-Up: At [facility], you and your health needs are our priority.  As part of our  continuing mission to provide you with exceptional heart care, we have created designated Provider Care Teams.  These Care Teams include your primary Cardiologist (physician) and Advanced Practice Providers (APPs -  Physician Assistants and Nurse Practitioners) who all work together to provide you with the care you need, when you need it.  We recommend signing up for the patient portal called "MyChart".  Sign up information is provided on this After Visit Summary.  MyChart is used to connect with patients for Virtual Visits (Telemedicine).  Patients are able to view lab/test results, encounter notes, upcoming appointments, etc.  Non-urgent messages can be sent to your provider as well.   To learn more about what you can do  with MyChart, go to [email or web address].    Your next appointment:   3 month(s)  Provider:   Mertie Moores, MD     Other Instructions  PLEASE BRING BP CUFF TO THE PHARMACIST APPOINTMENT.    HOW TO TAKE YOUR BLOOD PRESSURE  Rest 5 minutes before taking your blood pressure. Don't  smoke or drink caffeinated beverages for at least 30 minutes before. Take your blood pressure before (not after) you eat. Sit comfortably with your back supported and both feet on the floor ( don't cross your legs). Elevate your arm to heart level on a table or a desk. Use the proper sized cuff.  It should fit smoothly and snugly around your bare upper arm.  There should be  Enough room to slip a fingertip under the cuff.  The bottom edge of the cuff should be 1 inch above the crease Of the elbow. Please monitor your blood pressure once daily 2 hours after your am medication. If you blood pressure Consistently remains above 509 (systolic) top number or over 88 ( diastolic) bottom number X 3 days  Consecutively.  Please call our office at [phone] or send Mychart message.     ----Avoid cold medicines with D or DM at the end of them----      Signed, Emmaline Life, NP   02/26/2022 12:30 PM    [place]

## 2022-02-25 NOTE — Patient Instructions (Signed)
Diphtheria; Tetanus; Pertussis (DTaP or Tdap) Vaccine Injection What is this medication? DIPHTHERIA; TETANUS; PERTUSSIS VACCINE (dif THEER ee uh; TET n [place]; per TUS iss VAK seen) reduces the risk of diphtheria, tetanus (lockjaw), and pertussis (whooping cough). It does not treat diphtheria, tetanus, or pertussis. It is still possible to get diphtheria, tetanus, or pertussis after receiving this vaccine, but the symptoms may be less severe or not last as long. It works by helping your immune system learn how to fight off a future infection. This medicine may be used for other purposes; ask your health care provider or pharmacist if you have questions. COMMON BRAND NAME(S): Adacel, Boostrix, Certiva, Daptacel, Infanrix, Tripedia What should I tell my care team before I take this medication? They need to know if you have any of these conditions: Blood disorders, such as hemophilia Fever or infection Immune system problems Neurologic disease Seizures An unusual or allergic reaction to other vaccines, latex, other medications, foods, dyes, or preservatives Pregnant or trying to get pregnant Breastfeeding How should I use this medication? This vaccine is injected into a muscle. It is given by your care team. A copy of Vaccine Information Statements will be given before each vaccination. Be sure to read this information carefully each time. This sheet may change often. Talk to your care team about the use of this medication in children. While the DTaP vaccine may be given to children as young as 6 weeks and the Tdap vaccine may be given to children as young as 80 years old, precautions do apply. Overdosage: If you think you have taken too much of this medicine contact a poison control center or emergency room at once. NOTE: This medicine is only for you. Do not share this medicine with others. What if I miss a dose? It is important not to miss your dose. Call your care team if you are unable to keep an  appointment. What may interact with this medication? This medication may interact with the following: Certain medications that prevent or treat blood clots, such as warfarin, enoxaparin, dalteparin Immune globulin Medications that lower your chance of fighting an infection, such as adalimumab, anakinra, infliximab Medications to treat cancer Steroid medications, such as prednisone or cortisone This list may not describe all possible interactions. Give your health care provider a list of all the medicines, herbs, non-prescription drugs, or dietary supplements you use. Also tell them if you smoke, drink alcohol, or use illegal drugs. Some items may interact with your medicine. What should I watch for while using this medication? See your care team for all shots of this vaccine as directed. Report any side effects to your care team right away. This vaccine, like all vaccines, may not fully protect everyone. What side effects may I notice from receiving this medication? Side effects that you should report to your care team as soon as possible: Allergic reactions--skin rash, itching, hives, swelling of the face, lips, tongue, or throat Feeling faint or lightheaded Side effects that usually do not require medical attention (report these to your care team if they continue or are bothersome): Chills Fever General discomfort and fatigue Headache Joint pain Muscle pain Pain, redness, or irritation at injection site This list may not describe all possible side effects. Call your doctor for medical advice about side effects. You may report side effects to FDA at 1-800-FDA-1088. Where should I keep my medication? This vaccine is only given by your care team. It will not be stored at home. NOTE: This  sheet is a summary. It may not cover all possible information. If you have questions about this medicine, talk to your doctor, pharmacist, or health care provider.  2023 Elsevier/Gold Standard  (2021-07-29 00:00:00)

## 2022-02-25 NOTE — Progress Notes (Signed)
Patient presents today for tdap. Coverage run through [employer].

## 2022-02-26 ENCOUNTER — Encounter: Payer: Self-pay | Admitting: Nurse Practitioner

## 2022-02-26 ENCOUNTER — Ambulatory Visit: Payer: Medicare Other | Attending: Nurse Practitioner | Admitting: Nurse Practitioner

## 2022-02-26 VITALS — BP 130/72 | HR 73 | Ht 62.0 in | Wt 162.0 lb

## 2022-02-26 DIAGNOSIS — E785 Hyperlipidemia, unspecified: Secondary | ICD-10-CM | POA: Diagnosis not present

## 2022-02-26 DIAGNOSIS — I25118 Atherosclerotic heart disease of native coronary artery with other forms of angina pectoris: Secondary | ICD-10-CM | POA: Insufficient documentation

## 2022-02-26 DIAGNOSIS — I5032 Chronic diastolic (congestive) heart failure: Secondary | ICD-10-CM | POA: Insufficient documentation

## 2022-02-26 DIAGNOSIS — J8410 Pulmonary fibrosis, unspecified: Secondary | ICD-10-CM | POA: Insufficient documentation

## 2022-02-26 DIAGNOSIS — G4733 Obstructive sleep apnea (adult) (pediatric): Secondary | ICD-10-CM | POA: Diagnosis not present

## 2022-02-26 DIAGNOSIS — I110 Hypertensive heart disease with heart failure: Secondary | ICD-10-CM | POA: Insufficient documentation

## 2022-02-26 DIAGNOSIS — I493 Ventricular premature depolarization: Secondary | ICD-10-CM | POA: Insufficient documentation

## 2022-02-26 DIAGNOSIS — I4729 Other ventricular tachycardia: Secondary | ICD-10-CM | POA: Diagnosis not present

## 2022-02-26 DIAGNOSIS — R079 Chest pain, unspecified: Secondary | ICD-10-CM | POA: Diagnosis not present

## 2022-02-26 DIAGNOSIS — I10 Essential (primary) hypertension: Secondary | ICD-10-CM | POA: Insufficient documentation

## 2022-02-26 DIAGNOSIS — R350 Frequency of micturition: Secondary | ICD-10-CM | POA: Diagnosis not present

## 2022-02-26 NOTE — Patient Instructions (Signed)
Medication Instructions:   Your physician recommends that you continue on your current medications as directed. Please refer to the Current Medication list given to you today.   *If you need a refill on your cardiac medications before your next appointment, please call your pharmacy*   Lab Work:  Your physician recommends that you return for a FASTING lipid profile on Monday, January 22. You can come in on the day of your appointment anytime between 7:30-4:30 fasting from midnight the night before.    If you have labs (blood work) drawn today and your tests are completely normal, you will receive your results only by: [place] (if you have MyChart) OR A paper copy in the mail If you have any lab test that is abnormal or we need to change your treatment, we will call you to review the results.   Testing/Procedures:  How to Prepare for Your Cardiac PET/CT Stress Test:  1. Please do not take these medications before your test:   Medications that may interfere with the cardiac pharmacological stress agent (ex. nitrates - including isosorbide mononitrate or beta-blockers) the day of the exam. Your remaining medications may be taken with water. HOLD IMDUR AND COREG AM OF TEST.  2. Nothing to eat or drink, except water, 3 hours prior to arrival time.   NO caffeine/decaffeinated products, or chocolate 12 hours prior to arrival.  3. NO perfume  or lotion  4. Total time is 1 to 2 hours; you may want to bring reading material for the waiting time.  5. Please report to Admitting at the [facility] Main Entrance 30 minutes early for your test.  [place],  [id number]  Diabetic Preparation:  Hold oral medications.(Janumet) Check blood sugars prior to leaving the house. If able to eat breakfast prior to 3 hour fasting, you may take all medications, including your insulin,  In preparation for your appointment, medication and supplies will be purchased.   Appointment availability is limited, so if you need to cancel or reschedule, please call the Radiology Department at [phone]  24 hours in advance to avoid a cancellation fee of $100.00  What to Expect After you Arrive:  Once you arrive and check in for your appointment, you will be taken to a preparation room within the Radiology Department.  A technologist or Nurse will obtain your medical history, verify that you are correctly prepped for the exam, and explain the procedure.  Afterwards,  an IV will be started in your arm and electrodes will be placed on your skin for EKG monitoring during the stress portion of the exam. Then you will be escorted to the PET/CT scanner.  There, staff will get you positioned on the scanner and obtain a blood pressure and EKG.  During the exam, you will continue to be connected to the EKG and blood pressure machines.  A small, safe amount of a radioactive tracer will be injected in your IV to obtain a series of pictures of your heart along with an injection of a stress agent.    After your Exam:  It is recommended that you eat a meal and drink a caffeinated beverage to counter act any effects of the stress agent.  Drink plenty of fluids for the remainder of the day and urinate frequently for the first couple of hours after the exam.  Your doctor will inform you of your test results within 7-10 business days.  For questions about your test or how  to prepare for your test, please call: Marchia Bond, Cardiac Imaging Nurse Navigator  Gordy Clement, Cardiac Imaging Nurse Navigator Office: [phone]    Follow-Up: At [facility], you and your health needs are our priority.  As part of our continuing mission to provide you with exceptional heart care, we have created designated Provider Care Teams.  These Care Teams include your primary Cardiologist (physician) and Advanced Practice Providers (APPs -  Physician Assistants and Nurse Practitioners) who all  work together to provide you with the care you need, when you need it.  We recommend signing up for the patient portal called "MyChart".  Sign up information is provided on this After Visit Summary.  MyChart is used to connect with patients for Virtual Visits (Telemedicine).  Patients are able to view lab/test results, encounter notes, upcoming appointments, etc.  Non-urgent messages can be sent to your provider as well.   To learn more about what you can do with MyChart, go to [email or web address].    Your next appointment:   3 month(s)  Provider:   Mertie Moores, MD     Other Instructions  PLEASE BRING BP CUFF TO THE PHARMACIST APPOINTMENT.    HOW TO TAKE YOUR BLOOD PRESSURE  Rest 5 minutes before taking your blood pressure. Don't  smoke or drink caffeinated beverages for at least 30 minutes before. Take your blood pressure before (not after) you eat. Sit comfortably with your back supported and both feet on the floor ( don't cross your legs). Elevate your arm to heart level on a table or a desk. Use the proper sized cuff.  It should fit smoothly and snugly around your bare upper arm.  There should be  Enough room to slip a fingertip under the cuff.  The bottom edge of the cuff should be 1 inch above the crease Of the elbow. Please monitor your blood pressure once daily 2 hours after your am medication. If you blood pressure Consistently remains above 726 (systolic) top number or over 88 ( diastolic) bottom number X 3 days  Consecutively.  Please call our office at [phone] or send Mychart message.     ----Avoid cold medicines with D or DM at the end of them----

## 2022-03-03 ENCOUNTER — Ambulatory Visit: Payer: Medicare Other | Attending: Cardiovascular Disease

## 2022-03-03 ENCOUNTER — Ambulatory Visit: Payer: Medicare Other

## 2022-03-03 DIAGNOSIS — E785 Hyperlipidemia, unspecified: Secondary | ICD-10-CM | POA: Diagnosis not present

## 2022-03-03 DIAGNOSIS — I25118 Atherosclerotic heart disease of native coronary artery with other forms of angina pectoris: Secondary | ICD-10-CM

## 2022-03-03 DIAGNOSIS — R079 Chest pain, unspecified: Secondary | ICD-10-CM | POA: Diagnosis not present

## 2022-03-03 DIAGNOSIS — I10 Essential (primary) hypertension: Secondary | ICD-10-CM

## 2022-03-04 LAB — LIPID PANEL
Chol/HDL Ratio: 2.8 ratio (ref 0.0–4.4)
Cholesterol, Total: 129 mg/dL (ref 100–199)
HDL: 46 mg/dL (ref >39)
LDL Chol Calc (NIH): 69 mg/dL (ref 0–99)
Triglycerides: 71 mg/dL (ref 0–149)
VLDL Cholesterol Cal: 14 mg/dL (ref 5–40)

## 2022-03-10 DIAGNOSIS — H401122 Primary open-angle glaucoma, left eye, moderate stage: Secondary | ICD-10-CM | POA: Diagnosis not present

## 2022-03-10 DIAGNOSIS — E785 Hyperlipidemia, unspecified: Secondary | ICD-10-CM | POA: Diagnosis not present

## 2022-03-10 DIAGNOSIS — E1169 Type 2 diabetes mellitus with other specified complication: Secondary | ICD-10-CM | POA: Diagnosis not present

## 2022-03-10 DIAGNOSIS — H2013 Chronic iridocyclitis, bilateral: Secondary | ICD-10-CM | POA: Diagnosis not present

## 2022-03-10 DIAGNOSIS — E119 Type 2 diabetes mellitus without complications: Secondary | ICD-10-CM | POA: Diagnosis not present

## 2022-03-10 DIAGNOSIS — H401113 Primary open-angle glaucoma, right eye, severe stage: Secondary | ICD-10-CM | POA: Diagnosis not present

## 2022-03-17 ENCOUNTER — Telehealth (HOSPITAL_COMMUNITY): Payer: Self-pay | Admitting: Emergency Medicine

## 2022-03-17 NOTE — Telephone Encounter (Signed)
Reaching out to patient to offer assistance regarding upcoming cardiac imaging study; pt verbalizes understanding of appt date/time, parking situation and where to check in, pre-test NPO status and medications ordered, and verified current allergies; name and call back number provided for further questions should they arise Sheila Bond RN Navigator Cardiac Imaging [facility] [phone] office [phone] cell  Arrival [address] Difficult IV  No caffeine No food Holding coreg, nitro, imdur

## 2022-03-18 ENCOUNTER — Ambulatory Visit (HOSPITAL_COMMUNITY)
Admission: RE | Admit: 2022-03-18 | Discharge: 2022-03-18 | Disposition: A | Discharge status: Home / Self Care | Payer: Medicare Other | Source: Ambulatory Visit | Attending: Nurse Practitioner | Admitting: Nurse Practitioner

## 2022-03-18 DIAGNOSIS — I25118 Atherosclerotic heart disease of native coronary artery with other forms of angina pectoris: Secondary | ICD-10-CM | POA: Insufficient documentation

## 2022-03-18 DIAGNOSIS — R079 Chest pain, unspecified: Secondary | ICD-10-CM | POA: Diagnosis not present

## 2022-03-18 DIAGNOSIS — I10 Essential (primary) hypertension: Secondary | ICD-10-CM | POA: Insufficient documentation

## 2022-03-18 DIAGNOSIS — E785 Hyperlipidemia, unspecified: Secondary | ICD-10-CM | POA: Diagnosis not present

## 2022-03-18 MED ORDER — RUBIDIUM RB82 GENERATOR (RUBYFILL)
18.9800 | PACK | Freq: Once | INTRAVENOUS | Status: AC
  Administered 2022-03-18: 18.98 via INTRAVENOUS

## 2022-03-18 MED ORDER — REGADENOSON 0.4 MG/5ML IV SOLN: 0.4000 mg | Freq: Once | INTRAVENOUS | Status: AC

## 2022-03-18 MED ORDER — REGADENOSON 0.4 MG/5ML IV SOLN
INTRAVENOUS | Status: AC
  Administered 2022-03-18: 0.4 mg via INTRAVENOUS
  Filled 2022-03-18: qty 5

## 2022-03-18 MED ORDER — RUBIDIUM RB82 GENERATOR (RUBYFILL)
18.9500 | PACK | Freq: Once | INTRAVENOUS | Status: AC
  Administered 2022-03-18: 18.95 via INTRAVENOUS

## 2022-03-19 ENCOUNTER — Encounter: Payer: Self-pay | Admitting: Internal Medicine

## 2022-03-19 LAB — NM PET CT CARDIAC PERFUSION MULTI W/ABSOLUTE BLOODFLOW
LV dias vol: 62 mL (ref 46–106)
LV sys vol: 24 mL
MBFR: 2.1
Nuc Rest EF: 58 %
Nuc Stress EF: 61 %
Peak HR: 106 {beats}/min
Rest HR: 81 {beats}/min
Rest MBF: 1.16 ml/g/min
Rest Nuclear Isotope Dose: 19 mCi
ST Depression (mm): 0 mm
Stress MBF: 2.44 ml/g/min
Stress Nuclear Isotope Dose: 19 mCi
TID: 1.06

## 2022-03-19 NOTE — Assessment & Plan Note (Signed)
Plan-doxycycline, sputum cultures

## 2022-03-19 NOTE — Assessment & Plan Note (Signed)
Frequent nocturia is interfering with CPAP use. Plan-refer to orthodontics to consider an oral appliance which may be easier for her to work with than CPAP at this point.

## 2022-03-19 NOTE — Assessment & Plan Note (Signed)
Continue to follow with cardiology.

## 2022-03-19 NOTE — Assessment & Plan Note (Signed)
We will look at updating CT when we see her again in April

## 2022-03-19 NOTE — Assessment & Plan Note (Signed)
Stable on January 2024 CT. Plan-keep follow-up as planned with Dr. Vaughan Browner in 6 months

## 2022-03-20 DIAGNOSIS — H15012 Anterior scleritis, left eye: Secondary | ICD-10-CM | POA: Diagnosis not present

## 2022-03-20 DIAGNOSIS — H2013 Chronic iridocyclitis, bilateral: Secondary | ICD-10-CM | POA: Diagnosis not present

## 2022-03-25 ENCOUNTER — Telehealth: Payer: Self-pay | Admitting: *Deleted

## 2022-03-25 NOTE — Telephone Encounter (Signed)
   Pre-operative Risk Assessment    Patient Name: Sheila Reynolds  DOB: 02/18/1945 MRN: [id number]     Request for Surgical Clearance    Procedure:   2 DENTAL IMPLANTS ONLY (IN THE RIGHT MANDIBLE )  Date of Surgery:  Clearance TBD                                 Surgeon:  DR. Tamela Oddi, DDS Surgeon's Group or Practice Name:  [facility]  Phone number:  [phone] Fax number:  [phone]   Type of Clearance Requested:   - Medical  - Pharmacy:  Hold Aspirin and Clopidogrel (Plavix) ;REQUESTING HOW LONG TO HOLD PLAVIX AND ASA AND WHEN TO RESUME   Type of Anesthesia:   IV SEDATION (VERSED, FENTANYL)   Additional requests/questions:    Jiles Prows   03/25/2022, 4:16 PM

## 2022-03-25 NOTE — Telephone Encounter (Signed)
   Patient Name: Sheila Reynolds  DOB: Nov 30, 1945 MRN: [id number]  Primary Cardiologist: Mertie Moores, MD  Chart reviewed as part of pre-operative protocol coverage. Given past medical history and time since last visit, based on ACC/AHA guidelines, Sheila Reynolds is at acceptable risk for the planned procedure without further cardiovascular testing.  She is able to complete 4 METS of activity without cardiac symptoms.  Per DAPT protocol patient can hold Plavix 5 days prior to procedure and aspirin 7 days prior to procedure.  Please restart when hemostasis is achieved postprocedure.  I will route this recommendation to the requesting party via Epic fax function and remove from pre-op pool.  Please call with questions.  Mable Fill, Marissa Nestle, NP 03/25/2022, 4:58 PM

## 2022-03-25 NOTE — Telephone Encounter (Signed)
She is able to achieve > 4 METS activity without concerning cardiac symptoms.  She is at low risk for MACE and may proceed with low risk procedure.  Emmaline Life, NP-C 03/25/2022, 4:53 PM [address] Office [phone] Fax [phone]

## 2022-03-25 NOTE — Telephone Encounter (Signed)
Hello Sheila Reynolds,  Sheila Reynolds was last seen by you on 02/26/2022 for evaluation of calcification seen on CT.  She had a cardiac PET completed due to complaint of chest pain that was normal and low risk for ischemia.  We received a surgical clearance request for 2 dental implants in the right mandible.  Could you please comment on medical clearance for her upcoming procedure.  Please forward your comments and recommendations to P CV DIV PREOP.  Thank you

## 2022-03-27 ENCOUNTER — Emergency Department (HOSPITAL_BASED_OUTPATIENT_CLINIC_OR_DEPARTMENT_OTHER): Payer: Medicare Other

## 2022-03-27 ENCOUNTER — Emergency Department (HOSPITAL_BASED_OUTPATIENT_CLINIC_OR_DEPARTMENT_OTHER)
Admission: EM | Admit: 2022-03-27 | Discharge: 2022-03-27 | Disposition: A | Discharge status: Home / Self Care | Payer: Medicare Other | Attending: Emergency Medicine | Admitting: Emergency Medicine

## 2022-03-27 ENCOUNTER — Encounter (HOSPITAL_BASED_OUTPATIENT_CLINIC_OR_DEPARTMENT_OTHER): Payer: Self-pay | Admitting: Urology

## 2022-03-27 ENCOUNTER — Other Ambulatory Visit: Payer: Self-pay

## 2022-03-27 DIAGNOSIS — I10 Essential (primary) hypertension: Secondary | ICD-10-CM | POA: Insufficient documentation

## 2022-03-27 DIAGNOSIS — J45909 Unspecified asthma, uncomplicated: Secondary | ICD-10-CM | POA: Diagnosis not present

## 2022-03-27 DIAGNOSIS — I2510 Atherosclerotic heart disease of native coronary artery without angina pectoris: Secondary | ICD-10-CM | POA: Insufficient documentation

## 2022-03-27 DIAGNOSIS — R55 Syncope and collapse: Secondary | ICD-10-CM | POA: Insufficient documentation

## 2022-03-27 DIAGNOSIS — E119 Type 2 diabetes mellitus without complications: Secondary | ICD-10-CM | POA: Diagnosis not present

## 2022-03-27 DIAGNOSIS — M47814 Spondylosis without myelopathy or radiculopathy, thoracic region: Secondary | ICD-10-CM | POA: Diagnosis not present

## 2022-03-27 DIAGNOSIS — J849 Interstitial pulmonary disease, unspecified: Secondary | ICD-10-CM | POA: Diagnosis not present

## 2022-03-27 DIAGNOSIS — R918 Other nonspecific abnormal finding of lung field: Secondary | ICD-10-CM | POA: Diagnosis not present

## 2022-03-27 DIAGNOSIS — R002 Palpitations: Secondary | ICD-10-CM

## 2022-03-27 LAB — BASIC METABOLIC PANEL
Anion gap: 6 (ref 5–15)
BUN: 19 mg/dL (ref 8–23)
CO2: 26 mmol/L (ref 22–32)
Calcium: 8.9 mg/dL (ref 8.9–10.3)
Chloride: 104 mmol/L (ref 98–111)
Creatinine, Ser: 0.79 mg/dL (ref 0.44–1.00)
GFR, Estimated: >60 mL/min (ref >60)
Glucose, Bld: 101 mg/dL — ABNORMAL HIGH (ref 70–99)
Potassium: 3.8 mmol/L (ref 3.5–5.1)
Sodium: 136 mmol/L (ref 135–145)

## 2022-03-27 LAB — CBC
HCT: 33.9 % — ABNORMAL LOW (ref 36.0–46.0)
Hemoglobin: 11 g/dL — ABNORMAL LOW (ref 12.0–15.0)
MCH: 27.6 pg (ref 26.0–34.0)
MCHC: 32.4 g/dL (ref 30.0–36.0)
MCV: 85 fL (ref 80.0–100.0)
Platelets: 177 10*3/uL (ref 150–400)
RBC: 3.99 MIL/uL (ref 3.87–5.11)
RDW: 13.5 % (ref 11.5–15.5)
WBC: 7.1 10*3/uL (ref 4.0–10.5)
nRBC: 0 % (ref 0.0–0.2)

## 2022-03-27 LAB — TROPONIN I (HIGH SENSITIVITY): Troponin I (High Sensitivity): 5 ng/L (ref <18)

## 2022-03-27 NOTE — Discharge Instructions (Signed)
You were seen in the emergency room today with heart palpitations.  Your heart labs are normal injury EKG is reassuring.  Please follow closely with your cardiologist and primary care physician.  Please continue taking your home medications.  Return with any new or suddenly worsening symptoms.

## 2022-03-27 NOTE — ED Triage Notes (Signed)
Pt state earlier today felt like her heart was fluttering, got lightheaded and felt like she was going to pass out.  States no longer lightheaded but feels like fluttering in chest is still there  Denies any Pain

## 2022-03-27 NOTE — ED Provider Notes (Signed)
Emergency Department Provider Note   I have reviewed the triage vital signs and the nursing notes.   HISTORY  Chief Complaint Near Syncope   HPI Sheila Reynolds is a 77 y.o. female presents to the ED with her husband for evaluation of palpitations. No CP or SOB. No fever. Has had issues like this in the past. No active symptoms. She does follow up with a Cardiologist. No abdominal pain, vomiting, or diarrhea. No new medications.    Past Medical History:  Diagnosis Date   Anemia    3 months ago anemic   Anxiety    on meds   Arthritis    "all over" (01/15/2017)   Asthma    Bronchitis with emphysema    Chest pain    Chronic bronchitis ([place])    Coronary artery disease    a. 01/2017 she underwent orbital atherectomy/DES to the proxmal LAD and PTCA to ostial D2. 2D Echo 01/15/17 showed mild LVH, EF 60-65%, grade 1 DD.   Family history of anesthesia complication    daughter N/V   Fibromyalgia    GERD (gastroesophageal reflux disease)    on meds   Glaucoma    Heart murmur    History of hiatal hernia    Hx of echocardiogram    Echo (03/2013):  Tech limited; Mild focal basal septal hypertrophy, EF 60-65%, normal RVF   Hyperlipidemia    Hypertension    Nonsustained ventricular tachycardia (HCC)    OSA on CPAP    Premature atrial contractions    PVC (premature ventricular contraction)    a. Holter 12/16: NSR, occ PAC,PVCs   Sarcoidosis    Type II diabetes mellitus ([place])     Review of Systems  Constitutional: No fever/chills Eyes: No visual changes. ENT: No sore throat. Cardiovascular: Denies chest pain. Respiratory: Denies shortness of breath. Gastrointestinal: No abdominal pain.  No nausea, no vomiting.  No diarrhea.  No constipation. Genitourinary: Negative for dysuria. Musculoskeletal: Negative for back pain. Skin: Negative for rash. Neurological: Negative for headaches, focal weakness or numbness.  ____________________________________________   PHYSICAL  EXAM:  VITAL SIGNS: ED Triage Vitals  Enc Vitals Group     BP 03/27/22 1848 (!) 151/98     Pulse Rate 03/27/22 1848 79     Resp 03/27/22 1848 18     Temp 03/27/22 1848 98.7 F (37.1 C)     Temp Source 03/27/22 1848 Oral     SpO2 03/27/22 1848 98 %     Weight 03/27/22 1845 162 lb 0.6 oz (73.5 kg)     Height 03/27/22 1845 5' 2"$  (1.575 m)   Constitutional: Alert and oriented. Well appearing and in no acute distress. Eyes: Conjunctivae are normal.  Head: Atraumatic. Nose: No congestion/rhinnorhea. Mouth/Throat: Mucous membranes are moist.  Neck: No stridor.  Cardiovascular: Normal rate, regular rhythm. Good peripheral circulation. Grossly normal heart sounds.   Respiratory: Normal respiratory effort.  No retractions. Lungs CTAB. Gastrointestinal: Soft and nontender. No distention.  Musculoskeletal: No lower extremity tenderness nor edema. No gross deformities of extremities. Neurologic:  Normal speech and language. No gross focal neurologic deficits are appreciated.  Skin:  Skin is warm, dry and intact. No rash noted.  ____________________________________________   LABS (all labs ordered are listed, but only abnormal results are displayed)  Labs Reviewed  BASIC METABOLIC PANEL - Abnormal; Notable for the following components:      Result Value   Glucose, Bld 101 (*)    All other components within  normal limits  CBC - Abnormal; Notable for the following components:   Hemoglobin 11.0 (*)    HCT 33.9 (*)    All other components within normal limits  TROPONIN I (HIGH SENSITIVITY)   ____________________________________________  EKG   EKG Interpretation  Date/Time:  Thursday March 27 2022 18:55:41 EST Ventricular Rate:  74 PR Interval:  135 QRS Duration: 81 QT Interval:  390 QTC Calculation: 433 R Axis:   14 Text Interpretation: Sinus rhythm Atrial premature complex Confirmed by Nanda Quinton [id number]) on 03/27/2022 7:01:49 PM         ____________________________________________  RADIOLOGY  DG Chest 2 View  Result Date: 03/27/2022 CLINICAL DATA:  Fluttering sensation in her chest today and this evening. She felt lightheaded earlier. EXAM: CHEST - 2 VIEW COMPARISON:  03/11/2021.  High-resolution chest CT dated 02/18/2022. FINDINGS: The heart remains normal in size. The pulmonary vascularity remains normal. Mild linear atelectasis or scarring at the left lung base with mild progression. Interval minimally prominent interstitial markings. Stable mild peribronchial thickening. Mild thoracic spine degenerative changes. Diffuse osteopenia. IMPRESSION: 1. Mild linear atelectasis or scarring at the left lung base with mild progression. 2. Minimal chronic interstitial lung disease. Electronically Signed   By: Claudie Revering M.D.   On: 03/27/2022 21:13    ____________________________________________   PROCEDURES  Procedure(s) performed:   Procedures  None ____________________________________________   INITIAL IMPRESSION / ASSESSMENT AND PLAN / ED COURSE  Pertinent labs & imaging results that were available during my care of the patient were reviewed by me and considered in my medical decision making (see chart for details).   This patient is Presenting for Evaluation of CP, which does require a range of treatment options, and is a complaint that involves a high risk of morbidity and mortality.  The Differential Diagnoses includes but is not exclusive to acute coronary syndrome, aortic dissection, pulmonary embolism, cardiac tamponade, community-acquired pneumonia, pericarditis, musculoskeletal chest wall pain, etc.   Critical Interventions-    Medications - No data to display  Reassessment after intervention:     I did obtain Additional Historical Information from husband at bedside.   I decided to review pertinent External Data, and in summary patient is followed by Cardiology.    Clinical Laboratory Tests  Ordered, included troponin is negative. No AKI. No leukocytosis.   Radiologic Tests Ordered, included CXR. I independently interpreted the images and agree with radiology interpretation.   Cardiac Monitor Tracing which shows NSR.    Social Determinants of Health Risk patient is not an active smoker.   Medical Decision Making: Summary:  Patient presents to the ED with palpitations. No AKI or electrolyte abnormality. No fever.   Reevaluation with update and discussion with patient and husband. Discussed close Cardiology and PCP follow up.   Considered admission but no arrhythmia or elevated troponin.   Patient's presentation is most consistent with acute presentation with potential threat to life or bodily function.   Disposition: discharge  ____________________________________________  FINAL CLINICAL IMPRESSION(S) / ED DIAGNOSES  Final diagnoses:  Palpitations  Near syncope    Note:  This document was prepared using Dragon voice recognition software and may include unintentional dictation errors.  Nanda Quinton, MD, [facility] Emergency Medicine    Kinsler Soeder, [facility], MD 04/01/22 [phone]

## 2022-03-28 ENCOUNTER — Other Ambulatory Visit: Payer: Self-pay | Admitting: *Deleted

## 2022-03-28 MED ORDER — FUROSEMIDE 40 MG PO TABS: 40.0000 mg | ORAL_TABLET | Freq: Every day | ORAL | 1 refills | Status: DC | PRN

## 2022-04-01 ENCOUNTER — Telehealth: Payer: Self-pay

## 2022-04-01 NOTE — Transitions of Care (Post Inpatient/ED Visit) (Signed)
   04/01/2022  Name: Sheila Reynolds MRN: [id number] DOB: 07/04/1945  Today's TOC FU Call Status: Today's TOC FU Call Status:: Successful TOC FU Call Competed TOC FU Call Complete Date: 04/01/22  Transition Care Management Follow-up Telephone Call Date of Discharge: 03/27/22 Discharge Facility: [facility] Type of Discharge: Emergency Department Reason for ED Visit: Other: How have you been since you were released from the hospital?: Better Any questions or concerns?: No Patient Questions/Concerns Addressed: Other:  Items Reviewed: Did you receive and understand the discharge instructions provided?: Yes Medications obtained and verified?: Yes (Medications Reviewed) Any new allergies since your discharge?: No Dietary orders reviewed?: No Do you have support at home?: Yes  Home Care and Equipment/Supplies: [place] Ordered?: No Any new equipment or medical supplies ordered?: No  Functional Questionnaire: Do you need assistance with bathing/showering or dressing?: No Do you need assistance with meal preparation?: No Do you need assistance with eating?: No Do you have difficulty maintaining continence: No Do you need assistance with getting out of bed/getting out of a chair/moving?: No Do you have difficulty managing or taking your medications?: No  Folllow up appointments reviewed: PCP Follow-up appointment confirmed?: No MD Provider Line Number:[phone] Given: Yes [facility] Follow-up appointment confirmed?: No Reason Specialist Follow-Up Not Confirmed: Patient has Specialist Provider Number and will Call for Appointment Do you need transportation to your follow-up appointment?: No Do you understand care options if your condition(s) worsen?: Yes-patient verbalized understanding    [place], [place]

## 2022-04-08 ENCOUNTER — Encounter: Payer: Self-pay | Admitting: Pharmacist

## 2022-04-09 ENCOUNTER — Ambulatory Visit: Payer: Medicare Other | Attending: Interventional Cardiology | Admitting: Pharmacist

## 2022-04-09 VITALS — BP 132/66 | HR 91 | Wt 164.0 lb

## 2022-04-09 DIAGNOSIS — I25119 Atherosclerotic heart disease of native coronary artery with unspecified angina pectoris: Secondary | ICD-10-CM

## 2022-04-09 DIAGNOSIS — I10 Essential (primary) hypertension: Secondary | ICD-10-CM | POA: Diagnosis not present

## 2022-04-09 NOTE — Patient Instructions (Signed)
Your blood pressure is excellent today   Continue taking your current medications  You can use over the counter Voltaren gel to help with your neck pain  Keep your follow up with your primary care doctor as scheduled

## 2022-04-09 NOTE — Progress Notes (Unsigned)
Patient ID: [name]                 DOB: November 14, 1945                      MRN: [id number]     HPI: Sheila Reynolds is a 77 y.o. female referred by Dr. Irish Lack to HTN clinic. 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 Echocardiogram 09/05/21 showed LVEF of 60-65% and grade 1 diastolic dysfunction.   Pt seen by PharmD on 01/20/22. She was found not to be compliant to prescribed medical therapy and continuing to eat diet high in fat and sodium. Had also been on regimen of NSAIDs per eye doctor. BP technique was not appropriate and she was checking BP prior to taking morning medications. BP at that visit was 118/64 on carvedilol 25 mg BID and Imdur 60 mg daily. Advised to resume atorvastatin 80 mg in addition to Zetia 10 mg daily.   Pt saw Christen Bame, NP 02/26/22 where BP was controlled at 130/72 however home readings ranged [id number] systolic. No med changes made and pt advised to bring home cuff to f/u appt today.  Went to ED 03/27/22 for palpitations. BP 151/98 at the time. Felt like she was going to pass out and felt SOB. EKG NSR, chest CT normal, no med changes made.  Pt presents today for follow up. Has been taking Lasix once per week secondary to cramping. Having pain in her neck and shoulder today, using Tylenol. Chronic swelling in her legs, unchanged. Wears compression stockings which helps. Sleeps with foot of her bed elevated. Has balance problems, reports falling about 2 weeks ago and once every 3-4 weeks. Not sure of cause of her falls, doesn't remember if she is dizzy or tripping on something beforehand. Walking with her cane helps. BP has not been low. Home readings [id number] systolic. Uses a wrist cuff which she brings to office today. Home cuff reading a bit higher than manual reading but not too far off:  Home wrist cuff  reading in office: 138/71 Manual reading: 132/66   Current HTN meds: carvedilol '25mg'$  BID  BP goal: <140/87mHg - higher goal given frequent falls  Family History: Father with heart disease and DM. Paternal grandmother wit heart disease. Brother with DM.   Social History: The patient  reports that she quit smoking about 41 years ago. Her smoking use included cigarettes. She has a 4.00 pack-year smoking history. She has never used smokeless tobacco. She reports that she does not drink alcohol and does not use drugs.     Diet: Patient reports eating oatmeal, toast, banana or apple sauce with water for breakfast; 'whatever' for lunch, usually leftovers or a hot dog; cubed steak, mashed potatoes and gravy, green peas, roll, etc. Beverages are generally coffee or water. Occasional sodas. Tries to avoid salt.   Exercise: walks 3 times weekly for 20-30 minutes.   Home BP readings:   Wt Readings from Last 3 Encounters:  03/27/22 162 lb 0.6 oz (73.5 kg)  02/26/22 162 lb (73.5 kg)  02/20/22 164 lb 6.4 oz (74.6 kg)   BP Readings from Last 3 Encounters:  03/27/22 (!) 145/78  03/18/22 (!) 140/74  02/26/22 130/72   Pulse Readings from Last 3 Encounters:  03/27/22 80  03/18/22 (!) 103  02/26/22 73  Renal function: Estimated Creatinine Clearance: 58 mL/min (by C-G formula based on SCr of 0.79 mg/dL).  Past Medical History:  Diagnosis Date   Anemia    3 months ago anemic   Anxiety    on meds   Arthritis    "all over" (01/15/2017)   Asthma    Bronchitis with emphysema    Chest pain    Chronic bronchitis ([place])    Coronary artery disease    a. 01/2017 she underwent orbital atherectomy/DES to the proxmal LAD and PTCA to ostial D2. 2D Echo 01/15/17 showed mild LVH, EF 60-65%, grade 1 DD.   Family history of anesthesia complication    daughter N/V   Fibromyalgia    GERD (gastroesophageal reflux disease)    on meds   Glaucoma    Heart murmur    History of hiatal hernia    Hx of  echocardiogram    Echo (03/2013):  Tech limited; Mild focal basal septal hypertrophy, EF 60-65%, normal RVF   Hyperlipidemia    Hypertension    Nonsustained ventricular tachycardia (HCC)    OSA on CPAP    Premature atrial contractions    PVC (premature ventricular contraction)    a. Holter 12/16: NSR, occ PAC,PVCs   Sarcoidosis    Type II diabetes mellitus ([place])     Current Outpatient Medications on File Prior to Visit  Medication Sig Dispense Refill   acetaminophen (TYLENOL) 500 MG tablet Take 1,000 mg by mouth 2 (two) times daily as needed for moderate pain or headache.     albuterol (PROVENTIL) (2.5 MG/3ML) 0.083% nebulizer solution Take 3 mLs (2.5 mg total) by nebulization daily as needed for wheezing or shortness of breath. 75 mL 12   albuterol (VENTOLIN HFA) 108 (90 Base) MCG/ACT inhaler Inhale 2 puffs into the lungs every 6 (six) hours as needed for wheezing or shortness of breath. 18 g 12   ALPRAZolam (XANAX) 1 MG tablet Take by mouth.     ANORO ELLIPTA 62.5-25 MCG/ACT AEPB INHALE 1 PUFF INTO THE LUNGS DAILY AT 6 AM 60 each 3   aspirin EC 81 MG tablet Take 81 mg by mouth daily. Swallow whole.     atorvastatin (LIPITOR) 80 MG tablet Take 1 tablet (80 mg total) by mouth daily. 90 tablet 3   azaTHIOprine (IMURAN) 50 MG tablet Take by mouth.     azelastine (ASTELIN) 0.1 % nasal spray Place 1 spray into both nostrils 2 (two) times daily. Use in each nostril as directed 30 mL 1   azithromycin (ZITHROMAX) 250 MG tablet See admin instructions.     benzonatate (TESSALON) 100 MG capsule Take 1 capsule by mouth every 8 (eight) hours for cough. 21 capsule 0   Blood Glucose Monitoring Suppl (ACCU-CHEK AVIVA PLUS) w/Device KIT Use to check blood sugars 3 times a day. Dx code e11.65 1 kit 3   brimonidine (ALPHAGAN P) 0.1 % SOLN Place 1 drop into both eyes 2 (two) times daily.      brinzolamide (AZOPT) 1 % ophthalmic suspension Place 1 drop into both eyes 3 (three) times daily.     carvedilol  (COREG) 25 MG tablet Take 1 tablet (25 mg total) by mouth 2 (two) times daily. 180 tablet 3   cetirizine (ZYRTEC ALLERGY) 10 MG tablet Take 1 tablet (10 mg total) by mouth daily. 30 tablet 2   clopidogrel (PLAVIX) 75 MG tablet TAKE 1 TABLET(75 MG) BY MOUTH DAILY 90 tablet 3   doxycycline (VIBRA-TABS) 100 MG tablet  Take 1 tablet (100 mg total) by mouth 2 (two) times daily. 14 tablet 0   EPINEPHrine 0.3 mg/0.3 mL IJ SOAJ injection Inject 0.3 mg into the muscle as needed for anaphylaxis. 1 each 0   ezetimibe (ZETIA) 10 MG tablet Take 1 tablet (10 mg total) by mouth daily. 90 tablet 3   fluconazole (DIFLUCAN) 150 MG tablet Take 1 tablet (150 mg total) by mouth daily. 7 tablet 0   furosemide (LASIX) 40 MG tablet Take 1 tablet (40 mg total) by mouth daily as needed. 90 tablet 1   isosorbide mononitrate (IMDUR) 60 MG 24 hr tablet TAKE 1 TABLET(60 MG) BY MOUTH DAILY 90 tablet 3   ketorolac (ACULAR) 0.5 % ophthalmic solution Place 1 drop into the right eye 3 (three) times daily.     Lancets (ONETOUCH DELICA PLUS 123XX123) MISC CHECK BLOOD SUGAR BEFORE BREAKFAST AND DINNER 100 each 1   montelukast (SINGULAIR) 10 MG tablet TAKE 1 TABLET BY MOUTH EVERY DAY 90 tablet 1   Nebulizers (COMPRESSOR/NEBULIZER) MISC Use as directed 1 each 0   nitroGLYCERIN (NITROSTAT) 0.4 MG SL tablet Place 1 tablet (0.4 mg total) under the tongue every 5 (five) minutes as needed for chest pain. 25 tablet prn   pantoprazole (PROTONIX) 40 MG tablet TAKE 1 TABLET(40 MG) BY MOUTH DAILY 90 tablet 3   Polyvinyl Alcohol-Povidone PF (REFRESH) 1.4-0.6 % SOLN Place 1-2 drops into both eyes 3 (three) times daily as needed (dry/irritated eyes.).     potassium chloride SA (KLOR-CON) 20 MEQ tablet Take 1 tablet (20 mEq total) by mouth every other day. 90 tablet 1   prednisoLONE acetate (PRED FORTE) 1 % ophthalmic suspension Apply to eye.     pregabalin (LYRICA) 75 MG capsule TAKE 1 CAPSULE(75 MG) BY MOUTH TWICE DAILY 180 capsule 2   RHOPRESSA  0.02 % SOLN Place 1 drop into the left eye at bedtime.     sitaGLIPtin-metformin (JANUMET) 50-500 MG tablet Take 1 tablet by mouth 2 (two) times daily with a meal. 90 tablet 1   tiZANidine (ZANAFLEX) 4 MG tablet Take 1 tablet (4 mg total) by mouth daily. 30 tablet 1   traMADol (ULTRAM) 50 MG tablet Take by mouth.     Travoprost, BAK Free, (TRAVATAN) 0.004 % SOLN ophthalmic solution Place 1 drop into the right eye at bedtime.     No current facility-administered medications on file prior to visit.    Allergies  Allergen Reactions   Crestor [Rosuvastatin Calcium] Other (See Comments)    muscle aches   Demerol  [Meperidine Hcl]     Other reaction(s): Hallucinations   Shellfish Allergy Itching and Other (See Comments)    Crab, shrimp and lobster ---lips itch and tingle Was told not to eat again after having a allergy test. Lobster, crab and shrimp      Assessment/Plan:  1. Hypertension - BP well controlled today at goal < 140/20mHg. Higher BP goal given pt reported frequent falls about once a month. BP has not been running low, she is not sure what is causing her falls. Advised her to discuss with PCP when she sees her in the next few weeks. Will continue using her cane while ambulating. She'll continue on carvedilol '25mg'$  BID and furosemide '40mg'$  prn. F/u with PharmD as needed.  Charde Macfarlane E. Daliana Leverett, PharmD, BCACP, [id number] [address] [place] [address]Phone: [phone] Fax: [phone]/28/2024 4:28 PM

## 2022-04-23 ENCOUNTER — Ambulatory Visit: Payer: Medicare Other | Admitting: Internal Medicine

## 2022-04-23 ENCOUNTER — Ambulatory Visit: Payer: Medicare Other

## 2022-04-30 ENCOUNTER — Ambulatory Visit (INDEPENDENT_AMBULATORY_CARE_PROVIDER_SITE_OTHER): Payer: Medicare Other

## 2022-04-30 VITALS — BP 122/82 | HR 88 | Temp 98.1°F | Ht 65.0 in | Wt 162.8 lb

## 2022-04-30 DIAGNOSIS — Z0000 Encounter for general adult medical examination without abnormal findings: Secondary | ICD-10-CM

## 2022-04-30 NOTE — Patient Instructions (Addendum)
Sheila Reynolds , Thank you for taking time to come for your Medicare Wellness Visit. I appreciate your ongoing commitment to your health goals. Please review the following plan we discussed and let me know if I can assist you in the future.   These are the goals we discussed:  Goals      Manage Fatigue (Tiredness-COPD)     Follow Up Date 04/05/2021    - eat healthy - get at least 7 to 8 hours of sleep at night - get outdoors every day (weather permitting) - limit daytime naps - practice relaxation or meditation daily    Why is this important?   Feeling tired or worn out is a common symptom of COPD (chronic obstructive pulmonary disease).  Learning when you feel your best and when you need rest is important.  Managing the tiredness (fatigue) will help you be active and enjoy life.     Notes:  Please let me know if you have any questions     Patient Stated     No goals at this time     Patient Stated     03/23/2019, wants to be pain free      Patient Stated     03/28/2020, no goals     Patient Stated     04/04/2021, wants to feel better     Patient Stated     04/30/2022, wants to be able to move without falling, get balance in check        This is a list of the screening recommended for you and due dates:  Health Maintenance  Topic Date Due   Pneumonia Vaccine (2 of 2 - PCV) 12/22/2018   Eye exam for diabetics  10/11/2021   COVID-19 Vaccine (5 - 2023-24 season) 10/11/2021   Yearly kidney health urinalysis for diabetes  04/04/2022   Mammogram  05/23/2022   Hemoglobin A1C  07/25/2022   Complete foot exam   11/20/2022   Yearly kidney function blood test for diabetes  03/28/2023   Medicare Annual Wellness Visit  04/30/2023   Colon Cancer Screening  01/09/2030   DTaP/Tdap/Td vaccine (2 - Td or Tdap) 02/26/2032   Flu Shot  Completed   DEXA scan (bone density measurement)  Completed   Hepatitis C Screening: USPSTF Recommendation to screen - Ages 12-79 yo.  Completed   Zoster  (Shingles) Vaccine  Completed   HPV Vaccine  Aged Out    Advanced directives: Please bring a copy of your POA (Power of [place]) and/or Living Will to your next appointment.    Conditions/risks identified: wants to work on balance  Next appointment: Follow up in one year for your annual wellness visit    Preventive Care 2 Years and Older, Female Preventive care refers to lifestyle choices and visits with your health care provider that can promote health and wellness. What does preventive care include? A yearly physical exam. This is also called an annual well check. Dental exams once or twice a year. Routine eye exams. Ask your health care provider how often you should have your eyes checked. Personal lifestyle choices, including: Daily care of your teeth and gums. Regular physical activity. Eating a healthy diet. Avoiding tobacco and drug use. Limiting alcohol use. Practicing safe sex. Taking low-dose aspirin every day. Taking vitamin and mineral supplements as recommended by your health care provider. What happens during an annual well check? The services and screenings done by your health care provider during your annual well check will  depend on your age, overall health, lifestyle risk factors, and family history of disease. Counseling  Your health care provider may ask you questions about your: Alcohol use. Tobacco use. Drug use. Emotional well-being. Home and relationship well-being. Sexual activity. Eating habits. History of falls. Memory and ability to understand (cognition). Work and work Statistician. Reproductive health. Screening  You may have the following tests or measurements: Height, weight, and BMI. Blood pressure. Lipid and cholesterol levels. These may be checked every 5 years, or more frequently if you are over 37 years old. Skin check. Lung cancer screening. You may have this screening every year starting at age 59 if you have a 30-pack-year  history of smoking and currently smoke or have quit within the past 15 years. Fecal occult blood test (FOBT) of the stool. You may have this test every year starting at age 53. Flexible sigmoidoscopy or colonoscopy. You may have a sigmoidoscopy every 5 years or a colonoscopy every 10 years starting at age 58. Hepatitis C blood test. Hepatitis B blood test. Sexually transmitted disease (STD) testing. Diabetes screening. This is done by checking your blood sugar (glucose) after you have not eaten for a while (fasting). You may have this done every 1-3 years. Bone density scan. This is done to screen for osteoporosis. You may have this done starting at age 80. Mammogram. This may be done every 1-2 years. Talk to your health care provider about how often you should have regular mammograms. Talk with your health care provider about your test results, treatment options, and if necessary, the need for more tests. Vaccines  Your health care provider may recommend certain vaccines, such as: Influenza vaccine. This is recommended every year. Tetanus, diphtheria, and acellular pertussis (Tdap, Td) vaccine. You may need a Td booster every 10 years. Zoster vaccine. You may need this after age 9. Pneumococcal 13-valent conjugate (PCV13) vaccine. One dose is recommended after age 51. Pneumococcal polysaccharide (PPSV23) vaccine. One dose is recommended after age 52. Talk to your health care provider about which screenings and vaccines you need and how often you need them. This information is not intended to replace advice given to you by your health care provider. Make sure you discuss any questions you have with your health care provider. Document Released: 02/23/2015 Document Revised: 10/17/2015 Document Reviewed: 11/28/2014 Elsevier Interactive Patient Education  2017 [place] Prevention in the Home Falls can cause injuries. They can happen to people of all ages. There are many things you can  do to make your home safe and to help prevent falls. What can I do on the outside of my home? Regularly fix the edges of walkways and driveways and fix any cracks. Remove anything that might make you trip as you walk through a door, such as a raised step or threshold. Trim any bushes or trees on the path to your home. Use bright outdoor lighting. Clear any walking paths of anything that might make someone trip, such as rocks or tools. Regularly check to see if handrails are loose or broken. Make sure that both sides of any steps have handrails. Any raised decks and porches should have guardrails on the edges. Have any leaves, snow, or ice cleared regularly. Use sand or salt on walking paths during winter. Clean up any spills in your garage right away. This includes oil or grease spills. What can I do in the bathroom? Use night lights. Install grab bars by the toilet and in the tub and shower.  Do not use towel bars as grab bars. Use non-skid mats or decals in the tub or shower. If you need to sit down in the shower, use a plastic, non-slip stool. Keep the floor dry. Clean up any water that spills on the floor as soon as it happens. Remove soap buildup in the tub or shower regularly. Attach bath mats securely with double-sided non-slip rug tape. Do not have throw rugs and other things on the floor that can make you trip. What can I do in the bedroom? Use night lights. Make sure that you have a light by your bed that is easy to reach. Do not use any sheets or blankets that are too big for your bed. They should not hang down onto the floor. Have a firm chair that has side arms. You can use this for support while you get dressed. Do not have throw rugs and other things on the floor that can make you trip. What can I do in the kitchen? Clean up any spills right away. Avoid walking on wet floors. Keep items that you use a lot in easy-to-reach places. If you need to reach something above you,  use a strong step stool that has a grab bar. Keep electrical cords out of the way. Do not use floor polish or wax that makes floors slippery. If you must use wax, use non-skid floor wax. Do not have throw rugs and other things on the floor that can make you trip. What can I do with my stairs? Do not leave any items on the stairs. Make sure that there are handrails on both sides of the stairs and use them. Fix handrails that are broken or loose. Make sure that handrails are as long as the stairways. Check any carpeting to make sure that it is firmly attached to the stairs. Fix any carpet that is loose or worn. Avoid having throw rugs at the top or bottom of the stairs. If you do have throw rugs, attach them to the floor with carpet tape. Make sure that you have a light switch at the top of the stairs and the bottom of the stairs. If you do not have them, ask someone to add them for you. What else can I do to help prevent falls? Wear shoes that: Do not have high heels. Have rubber bottoms. Are comfortable and fit you well. Are closed at the toe. Do not wear sandals. If you use a stepladder: Make sure that it is fully opened. Do not climb a closed stepladder. Make sure that both sides of the stepladder are locked into place. Ask someone to hold it for you, if possible. Clearly mark and make sure that you can see: Any grab bars or handrails. First and last steps. Where the edge of each step is. Use tools that help you move around (mobility aids) if they are needed. These include: Canes. Walkers. Scooters. Crutches. Turn on the lights when you go into a dark area. Replace any light bulbs as soon as they burn out. Set up your furniture so you have a clear path. Avoid moving your furniture around. If any of your floors are uneven, fix them. If there are any pets around you, be aware of where they are. Review your medicines with your doctor. Some medicines can make you feel dizzy. This can  increase your chance of falling. Ask your doctor what other things that you can do to help prevent falls. This information is not intended to  replace advice given to you by your health care provider. Make sure you discuss any questions you have with your health care provider. Document Released: 11/23/2008 Document Revised: 07/05/2015 Document Reviewed: 03/03/2014 Elsevier Interactive Patient Education  2017 [employer].

## 2022-04-30 NOTE — Progress Notes (Signed)
Subjective:   Sheila Reynolds is a 77 y.o. female who presents for Medicare Annual (Subsequent) preventive examination.  Review of Systems     Cardiac Risk Factors include: advanced age (>70men, >32 women);diabetes mellitus;dyslipidemia;hypertension     Objective:    Today's Vitals   04/30/22 1044  BP: 122/82  Pulse: 88  Temp: 98.1 F (36.7 C)  TempSrc: Oral  SpO2: 96%  Weight: 162 lb 12.8 oz (73.8 kg)  Height: 5\' 5"  (1.651 m)   Body mass index is 27.09 kg/m.     04/30/2022   11:02 AM 03/27/2022    6:46 PM 09/20/2021   11:21 AM 04/04/2021   10:29 AM 02/26/2021    4:28 PM 10/08/2020    9:40 AM 09/03/2020   12:51 PM  Advanced Directives  Does Patient Have a Medical Advance Directive? Yes No Yes Yes Yes Yes Yes  Type of Advance Directive Living will;Healthcare Power of [place];Living will [place];Living will Healthcare Power of [place];Living will [place];Living will  Does patient want to make changes to medical advance directive?   No - Patient declined      Copy of [place] in Chart? No - copy requested   No - copy requested     Would patient like information on creating a medical advance directive?      No - Patient declined     Current Medications (verified) Outpatient Encounter Medications as of 04/30/2022  Medication Sig   acetaminophen (TYLENOL) 500 MG tablet Take 1,000 mg by mouth 2 (two) times daily as needed for moderate pain or headache.   albuterol (PROVENTIL) (2.5 MG/3ML) 0.083% nebulizer solution Take 3 mLs (2.5 mg total) by nebulization daily as needed for wheezing or shortness of breath.   albuterol (VENTOLIN HFA) 108 (90 Base) MCG/ACT inhaler Inhale 2 puffs into the lungs every 6 (six) hours as needed for wheezing or shortness of breath.   ANORO ELLIPTA 62.5-25 MCG/ACT AEPB INHALE 1 PUFF INTO THE LUNGS DAILY AT 6 AM   aspirin EC 81 MG  tablet Take 81 mg by mouth daily. Swallow whole.   atorvastatin (LIPITOR) 80 MG tablet Take 1 tablet (80 mg total) by mouth daily.   azaTHIOprine (IMURAN) 50 MG tablet Take by mouth.   azelastine (ASTELIN) 0.1 % nasal spray Place 1 spray into both nostrils 2 (two) times daily. Use in each nostril as directed   benzonatate (TESSALON) 100 MG capsule Take 1 capsule by mouth every 8 (eight) hours for cough.   Blood Glucose Monitoring Suppl (ACCU-CHEK AVIVA PLUS) w/Device KIT Use to check blood sugars 3 times a day. Dx code e11.65   brimonidine (ALPHAGAN P) 0.1 % SOLN Place 1 drop into both eyes 2 (two) times daily.    brinzolamide (AZOPT) 1 % ophthalmic suspension Place 1 drop into both eyes 3 (three) times daily.   carvedilol (COREG) 25 MG tablet Take 1 tablet (25 mg total) by mouth 2 (two) times daily.   cetirizine (ZYRTEC ALLERGY) 10 MG tablet Take 1 tablet (10 mg total) by mouth daily.   clopidogrel (PLAVIX) 75 MG tablet TAKE 1 TABLET(75 MG) BY MOUTH DAILY   EPINEPHrine 0.3 mg/0.3 mL IJ SOAJ injection Inject 0.3 mg into the muscle as needed for anaphylaxis.   ezetimibe (ZETIA) 10 MG tablet Take 1 tablet (10 mg total) by mouth daily.   furosemide (LASIX) 40 MG tablet Take 1 tablet (40  mg total) by mouth daily as needed.   isosorbide mononitrate (IMDUR) 60 MG 24 hr tablet TAKE 1 TABLET(60 MG) BY MOUTH DAILY   ketorolac (ACULAR) 0.5 % ophthalmic solution Place 1 drop into the right eye 3 (three) times daily.   Lancets (ONETOUCH DELICA PLUS 123XX123) MISC CHECK BLOOD SUGAR BEFORE BREAKFAST AND DINNER   montelukast (SINGULAIR) 10 MG tablet TAKE 1 TABLET BY MOUTH EVERY DAY   Nebulizers (COMPRESSOR/NEBULIZER) MISC Use as directed   pantoprazole (PROTONIX) 40 MG tablet TAKE 1 TABLET(40 MG) BY MOUTH DAILY   Polyvinyl Alcohol-Povidone PF (REFRESH) 1.4-0.6 % SOLN Place 1-2 drops into both eyes 3 (three) times daily as needed (dry/irritated eyes.).   potassium chloride SA (KLOR-CON) 20 MEQ tablet Take 1  tablet (20 mEq total) by mouth every other day.   prednisoLONE acetate (PRED FORTE) 1 % ophthalmic suspension Apply to eye.   pregabalin (LYRICA) 75 MG capsule TAKE 1 CAPSULE(75 MG) BY MOUTH TWICE DAILY   RHOPRESSA 0.02 % SOLN Place 1 drop into the left eye at bedtime.   sitaGLIPtin-metformin (JANUMET) 50-500 MG tablet Take 1 tablet by mouth 2 (two) times daily with a meal.   tiZANidine (ZANAFLEX) 4 MG tablet Take 1 tablet (4 mg total) by mouth daily.   traMADol (ULTRAM) 50 MG tablet Take by mouth.   Travoprost, BAK Free, (TRAVATAN) 0.004 % SOLN ophthalmic solution Place 1 drop into the right eye at bedtime.   ALPRAZolam (XANAX) 1 MG tablet Take by mouth. (Patient not taking: Reported on 04/30/2022)   azithromycin (ZITHROMAX) 250 MG tablet See admin instructions. (Patient not taking: Reported on 04/30/2022)   doxycycline (VIBRA-TABS) 100 MG tablet Take 1 tablet (100 mg total) by mouth 2 (two) times daily. (Patient not taking: Reported on 04/30/2022)   fluconazole (DIFLUCAN) 150 MG tablet Take 1 tablet (150 mg total) by mouth daily. (Patient not taking: Reported on 04/30/2022)   nitroGLYCERIN (NITROSTAT) 0.4 MG SL tablet Place 1 tablet (0.4 mg total) under the tongue every 5 (five) minutes as needed for chest pain.   No facility-administered encounter medications on file as of 04/30/2022.    Allergies (verified) Crestor [rosuvastatin calcium], Demerol  [meperidine hcl], and Shellfish allergy   History: Past Medical History:  Diagnosis Date   Anemia    3 months ago anemic   Anxiety    on meds   Arthritis    "all over" (01/15/2017)   Asthma    Bronchitis with emphysema    Chest pain    Chronic bronchitis ([place])    Coronary artery disease    a. 01/2017 she underwent orbital atherectomy/DES to the proxmal LAD and PTCA to ostial D2. 2D Echo 01/15/17 showed mild LVH, EF 60-65%, grade 1 DD.   Family history of anesthesia complication    daughter N/V   Fibromyalgia    GERD (gastroesophageal  reflux disease)    on meds   Glaucoma    Heart murmur    History of hiatal hernia    Hx of echocardiogram    Echo (03/2013):  Tech limited; Mild focal basal septal hypertrophy, EF 60-65%, normal RVF   Hyperlipidemia    Hypertension    Nonsustained ventricular tachycardia (HCC)    OSA on CPAP    Premature atrial contractions    PVC (premature ventricular contraction)    a. Holter 12/16: NSR, occ PAC,PVCs   Sarcoidosis    Type II diabetes mellitus ([place])    Past Surgical History:  Procedure Laterality Date   BREAST  SURGERY     CARDIAC CATHETERIZATION  05/04/2007   reveals overall normal left ventricular systolic function. Ejection fraction 65-70%   CATARACT EXTRACTION W/PHACO Right 07/20/2013   Procedure: CATARACT EXTRACTION PHACO AND INTRAOCULAR LENS PLACEMENT (IOC);  Surgeon: Marylynn Pearson, MD;  Location: [facility];  Service: Ophthalmology;  Laterality: Right;   COLONOSCOPY W/ BIOPSIES AND POLYPECTOMY     COLONOSCOPY WITH PROPOFOL N/A 09/07/2014   Procedure: COLONOSCOPY WITH PROPOFOL;  Surgeon: Juanita Craver, MD;  Location: [facility];  Service: Endoscopy;  Laterality: N/A;   COLONOSCOPY WITH PROPOFOL N/A 01/10/2020   Procedure: COLONOSCOPY WITH PROPOFOL;  Surgeon: Juanita Craver, MD;  [facility];  Service: Endoscopy;  Laterality: N/A;   CORONARY ANGIOPLASTY WITH STENT PLACEMENT  01/15/2017   CORONARY ATHERECTOMY N/A 01/15/2017   Procedure: CORONARY ATHERECTOMY;  Surgeon: Nelva Bush, MD;  Location: [facility];  Service: Cardiovascular;  Laterality: N/A;   CORONARY BALLOON ANGIOPLASTY N/A 01/15/2017   Procedure: CORONARY BALLOON ANGIOPLASTY;  Surgeon: Nelva Bush, MD;  Location: [facility];  Service: Cardiovascular;  Laterality: N/A;   CORONARY STENT INTERVENTION N/A 01/15/2017   Procedure: CORONARY STENT INTERVENTION;  Surgeon: Nelva Bush, MD;  Location: [facility];  Service: Cardiovascular;  Laterality: N/A;   ESOPHAGOGASTRODUODENOSCOPY  (EGD) WITH PROPOFOL N/A 09/07/2014   Procedure: ESOPHAGOGASTRODUODENOSCOPY (EGD) WITH PROPOFOL;  Surgeon: Juanita Craver, MD;  [facility];  Service: Endoscopy;  Laterality: N/A;   EXTERNAL EAR SURGERY Bilateral 1970s   tumors removed   EYE SURGERY Left 2019   cataract extraction    EYE SURGERY Right 02/09/2018   eyelid surgery    INTRAVASCULAR PRESSURE WIRE/FFR STUDY N/A 12/12/2016   Procedure: INTRAVASCULAR PRESSURE WIRE/FFR STUDY;  Surgeon: Nelva Bush, MD;  Location: [name];  Service: Cardiovascular;  Laterality: N/A;   MINI SHUNT INSERTION Right 07/20/2013   Procedure: INSERTION OF GLAUCOMA FILTRATION DEVICE RIGHT EYE;  Surgeon: Marylynn Pearson, MD;  Location: [facility];  Service: Ophthalmology;  Laterality: Right;   MITOMYCIN C APPLICATION Right [id number]   Procedure: MITOMYCIN C APPLICATION;  Surgeon: Marylynn Pearson, MD;  Location: [facility];  Service: Ophthalmology;  Laterality: Right;   MITOMYCIN C APPLICATION Right [id number]   Procedure: MITOMYCIN C APPLICATION RIGHT EYE;  Surgeon: Marylynn Pearson, MD;  Location: [facility];  Service: Ophthalmology;  Laterality: Right;   PLACEMENT OF BREAST IMPLANTS Bilateral 1992   "took all my breast tissue out; put implants in;fibrocystic breast disease "   POLYPECTOMY  01/10/2020   Procedure: POLYPECTOMY;  Surgeon: Juanita Craver, MD;  [facility];  Service: Endoscopy;;   RIGHT/LEFT HEART CATH AND CORONARY ANGIOGRAPHY N/A 12/12/2016   Procedure: RIGHT/LEFT HEART CATH AND CORONARY ANGIOGRAPHY;  Surgeon: Nelva Bush, MD;  Location: [facility];  Service: Cardiovascular;  Laterality: N/A;   TONSILLECTOMY     TOTAL ABDOMINAL HYSTERECTOMY  1982   TRABECULECTOMY Right 02/21/2015   Procedure: TRABECULECTOMY WITH [facility] ON THE RIGHT EYE;  Surgeon: Marylynn Pearson, MD;  Location: [facility];  Service: Ophthalmology;  Laterality: Right;   Family History  Problem Relation Age of Onset   Heart disease Father    Diabetes Father    Glaucoma  Father    Cancer Mother        unknown type, ?lung   Heart disease Paternal Grandmother    Cancer Paternal Grandmother        unknown type   Diabetes Brother    Glaucoma Brother    Social History  Socioeconomic History   Marital status: Married    Spouse name: Gerald Stabs   Number of children: 2   Years of education: Not on file   Highest education level: Not on file  Occupational History   Occupation: unemployed    Employer: [employer]  Tobacco Use   Smoking status: Former    Packs/day: 1.00    Years: 4.00    Additional pack years: 0.00    Total pack years: 4.00    Types: Cigarettes    Quit date: 02/11/1980    Years since quitting: 42.2   Smokeless tobacco: Never  Vaping Use   Vaping Use: Never used  Substance and Sexual Activity   Alcohol use: No   Drug use: No   Sexual activity: Yes  Other Topics Concern   Not on file  Social History Narrative   Lives with husband   Right hand   Drinks 2-3 cups caffeine daily   Social Determinants of Health   Financial Resource Strain: Low Risk  (04/30/2022)   Overall Financial Resource Strain (CARDIA)    Difficulty of Paying Living Expenses: Not hard at all  Food Insecurity: No Food Insecurity (04/30/2022)   Hunger Vital Sign    Worried About Running Out of Food in the Last Year: Never true    [place] in the Last Year: Never true  Transportation Needs: No Transportation Needs (04/30/2022)   PRAPARE - Hydrologist (Medical): No    Lack of Transportation (Non-Medical): No  Physical Activity: Inactive (04/30/2022)   Exercise Vital Sign    Days of Exercise per Week: 0 days    Minutes of Exercise per Session: 0 min  Stress: Stress Concern Present (04/30/2022)   [place]    Feeling of Stress : To some extent  Social Connections: Not on file    Tobacco Counseling Counseling given: Not Answered   Clinical  Intake:  Pre-visit preparation completed: Yes  Pain : No/denies pain     Nutritional Status: BMI 25 -29 Overweight Nutritional Risks: None Diabetes: Yes  How often do you need to have someone help you when you read instructions, pamphlets, or other written materials from your doctor or pharmacy?: 1 - Never  Diabetic? Yes Nutrition Risk Assessment:  Has the patient had any N/V/D within the last 2 months?  No  Does the patient have any non-healing wounds?  No  Has the patient had any unintentional weight loss or weight gain?  Yes   Diabetes:  Is the patient diabetic?  Yes  If diabetic, was a CBG obtained today?  No  Did the patient bring in their glucometer from home?  No  How often do you monitor your CBG's? daily.   Financial Strains and Diabetes Management:  Are you having any financial strains with the device, your supplies or your medication? No .  Does the patient want to be seen by Chronic Care Management for management of their diabetes?  No  Would the patient like to be referred to a Nutritionist or for Diabetic Management?  No   Diabetic Exams:  Diabetic Eye Exam: Overdue for diabetic eye exam. Pt has been advised about the importance in completing this exam. Patient advised to call and schedule an eye exam. Diabetic Foot Exam: Overdue, Pt has been advised about the importance in completing this exam. Pt is scheduled for diabetic foot exam on 11/19/2021.   Interpreter Needed?:  No  Information entered by :: NAllen LPN   Activities of Daily Living    04/30/2022   11:12 AM  In your present state of health, do you have any difficulty performing the following activities:  Hearing? 1  Comment has hearing aids  Vision? 1  Comment due to glaucoma  Difficulty concentrating or making decisions? 1  Walking or climbing stairs? 0  Dressing or bathing? 0  Doing errands, shopping? 0  Preparing Food and eating ? N  Using the Toilet? N  In the past six months, have  you accidently leaked urine? N  Do you have problems with loss of bowel control? N  Managing your Medications? N  Managing your Finances? N  Housekeeping or managing your Housekeeping? N    Patient Care Team: Glendale Chard, MD as PCP - General (Internal Medicine) [name], Wonda Cheng, MD as PCP - Cardiology (Cardiology) [name], Wonda Cheng, MD as Consulting Physician (Cardiology) Mayford Knife, [facility] (Pharmacist)  Indicate any recent Medical Services you may have received from other than [facility] providers in the past year (date may be approximate).     Assessment:   This is a routine wellness examination for Nimisha.  Hearing/Vision screen Vision Screening - Comments:: Regular eye exams, Dr. Dagoberto Ligas, Dr. Augustin Coupe  Dietary issues and exercise activities discussed: Current Exercise Habits: The patient does not participate in regular exercise at present   Goals Addressed             This Visit's Progress    Patient Stated       04/30/2022, wants to be able to move without falling, get balance in check       Depression Screen    04/30/2022   11:08 AM 11/19/2021    9:54 AM 10/15/2021    2:35 PM 04/04/2021   10:31 AM 12/13/2020   10:40 AM 10/08/2020   10:14 AM 03/28/2020   10:22 AM  PHQ 2/9 Scores  PHQ - 2 Score 2 0 6 6 0 1 0  PHQ- 9 Score 3 0 9 16 3       Fall Risk    04/30/2022   11:04 AM 11/19/2021    9:54 AM 04/04/2021   10:30 AM 10/08/2020   10:10 AM 03/28/2020   10:21 AM  Fall Risk   Falls in the past year? 1 0 1 1 1   Comment stumbles, trips, loses balance  loses balance  lost balance  Number falls in past yr: 1 0 1 1 1   Injury with Fall? 0 0 0 0 0  Risk for fall due to : Impaired balance/gait;History of fall(s);Medication side effect No Fall Risks Impaired balance/gait;Medication side effect Impaired mobility Impaired balance/gait;Medication side effect  Risk for fall due to: Comment    due to severe lymphedema   Follow up Falls evaluation completed;Education  provided;Falls prevention discussed Falls evaluation completed Falls evaluation completed;Education provided;Falls prevention discussed Falls prevention discussed Falls evaluation completed;Education provided;Falls prevention discussed    FALL RISK PREVENTION PERTAINING TO THE HOME:  Any stairs in or around the home? Yes  If so, are there any without handrails? No  Home free of loose throw rugs in walkways, pet beds, electrical cords, etc? Yes  Adequate lighting in your home to reduce risk of falls? Yes   ASSISTIVE DEVICES UTILIZED TO PREVENT FALLS:  Life alert? No  Use of a cane, walker or w/c? No  Grab bars in the bathroom? Yes  Shower chair or bench in shower? Yes  Elevated  toilet seat or a handicapped toilet? No   TIMED UP AND GO:  Was the test performed? Yes .  Length of time to ambulate 10 feet: 7 sec.   Gait slow and steady without use of assistive device  Cognitive Function:    09/18/2021    7:46 AM 04/03/2020    1:22 PM  MMSE - Mini Mental State Exam  Orientation to time 5 5  Orientation to Place 5 5  Registration 3 3  Attention/ Calculation 5 3  Recall 3 2  Language- name 2 objects 2 2  Language- repeat 1 1  Language- follow 3 step command 3 3  Language- read & follow direction 1 1  Write a sentence 1 1  Copy design 1 1  Total score 30 27        04/30/2022   11:20 AM 04/04/2021   10:45 AM 03/28/2020   10:25 AM 03/23/2019   10:33 AM 03/18/2018   12:21 PM  6CIT Screen  What Year? 0 points 0 points 0 points 0 points 0 points  What month? 0 points 0 points 0 points 0 points 0 points  What time? 0 points 0 points 0 points 0 points 0 points  Count back from 20 0 points 0 points 0 points 0 points 0 points  Months in reverse 0 points 0 points 0 points 0 points 0 points  Repeat phrase 0 points 0 points 0 points 0 points 0 points  Total Score 0 points 0 points 0 points 0 points 0 points    Immunizations Immunization History  Administered Date(s) Administered    Fluad Quad(high Dose 65+) 12/30/2019, 11/08/2020, 10/21/2021   Influenza Split 10/31/2013   Influenza Whole 12/23/2017   Influenza, High Dose Seasonal PF 11/18/2016, 12/21/2017, 11/23/2018   Influenza,inj,Quad PF,6+ Mos 02/25/2013, 03/19/2015   Influenza,inj,quad, With Preservative 11/11/2018   PFIZER(Purple Top)SARS-COV-2 Vaccination 03/01/2019, 03/22/2019, 11/22/2019   Pfizer Covid-19 Vaccine Bivalent Booster 46yrs & up 12/28/2020   Pneumococcal Polysaccharide-23 12/21/2017   Tdap 02/25/2022   Zoster Recombinat (Shingrix) 12/25/2017, 06/13/2021    TDAP status: Up to date  Flu Vaccine status: Up to date  Pneumococcal vaccine status: Up to date  Covid-19 vaccine status: Completed vaccines  Qualifies for Shingles Vaccine? Yes   Zostavax completed Yes   Shingrix Completed?: Yes  Screening Tests Health Maintenance  Topic Date Due   Pneumonia Vaccine 85+ Years old (2 of 2 - PCV) 12/22/2018   OPHTHALMOLOGY EXAM  10/11/2021   COVID-19 Vaccine (5 - 2023-24 season) 10/11/2021   Diabetic kidney evaluation - Urine ACR  04/04/2022   MAMMOGRAM  05/23/2022   HEMOGLOBIN A1C  07/25/2022   FOOT EXAM  11/20/2022   Diabetic kidney evaluation - eGFR measurement  03/28/2023   Medicare Annual Wellness (AWV)  04/30/2023   COLONOSCOPY (Pts 45-56yrs Insurance coverage will need to be confirmed)  01/09/2030   DTaP/Tdap/Td (2 - Td or Tdap) 02/26/2032   INFLUENZA VACCINE  Completed   DEXA SCAN  Completed   Hepatitis C Screening  Completed   Zoster Vaccines- Shingrix  Completed   HPV VACCINES  Aged Out    Health Maintenance  Health Maintenance Due  Topic Date Due   Pneumonia Vaccine 75+ Years old (2 of 2 - PCV) 12/22/2018   OPHTHALMOLOGY EXAM  10/11/2021   COVID-19 Vaccine (5 - 2023-24 season) 10/11/2021   Diabetic kidney evaluation - Urine ACR  04/04/2022    Colorectal cancer screening: No longer required.   Mammogram status: Completed 05/22/2021.  Repeat every year  Bone Density  status: Completed 12/15/2013.  Lung Cancer Screening: (Low Dose CT Chest recommended if Age 54-80 years, 30 pack-year currently smoking OR have quit w/in 15years.) does not qualify.   Lung Cancer Screening Referral: no  Additional Screening:  Hepatitis C Screening: does qualify; Completed 12/14/2017  Vision Screening: Recommended annual ophthalmology exams for early detection of glaucoma and other disorders of the eye. Is the patient up to date with their annual eye exam?  Yes  Who is the provider or what is the name of the office in which the patient attends annual eye exams? Dr. Dagoberto Ligas If pt is not established with a provider, would they like to be referred to a provider to establish care? No .   Dental Screening: Recommended annual dental exams for proper oral hygiene  Community Resource Referral / Chronic Care Management: CRR required this visit?  No   CCM required this visit?  No      Plan:     I have personally reviewed and noted the following in the patient's chart:   Medical and social history Use of alcohol, tobacco or illicit drugs  Current medications and supplements including opioid prescriptions. Patient is not currently taking opioid prescriptions. Functional ability and status Nutritional status Physical activity Advanced directives List of other physicians Hospitalizations, surgeries, and ER visits in previous 12 months Vitals Screenings to include cognitive, depression, and falls Referrals and appointments  In addition, I have reviewed and discussed with patient certain preventive protocols, quality metrics, and best practice recommendations. A written personalized care plan for preventive services as well as general preventive health recommendations were provided to patient.     Kellie Simmering, LPN   [id number]   Nurse Notes: patient is interested in trying physical therapy again for balance.

## 2022-05-05 ENCOUNTER — Other Ambulatory Visit: Payer: Self-pay | Admitting: Internal Medicine

## 2022-05-05 DIAGNOSIS — M545 Low back pain, unspecified: Secondary | ICD-10-CM

## 2022-05-06 DIAGNOSIS — R110 Nausea: Secondary | ICD-10-CM | POA: Diagnosis not present

## 2022-05-06 DIAGNOSIS — I959 Hypotension, unspecified: Secondary | ICD-10-CM | POA: Diagnosis not present

## 2022-05-06 DIAGNOSIS — R1111 Vomiting without nausea: Secondary | ICD-10-CM | POA: Diagnosis not present

## 2022-05-07 ENCOUNTER — Emergency Department (HOSPITAL_COMMUNITY): Payer: Medicare Other

## 2022-05-07 ENCOUNTER — Encounter (HOSPITAL_COMMUNITY): Payer: Self-pay | Admitting: Emergency Medicine

## 2022-05-07 ENCOUNTER — Other Ambulatory Visit: Payer: Self-pay

## 2022-05-07 ENCOUNTER — Emergency Department (HOSPITAL_COMMUNITY)
Admission: EM | Admit: 2022-05-07 | Discharge: 2022-05-07 | Disposition: A | Discharge status: Home / Self Care | Payer: Medicare Other | Attending: Emergency Medicine | Admitting: Emergency Medicine

## 2022-05-07 DIAGNOSIS — I509 Heart failure, unspecified: Secondary | ICD-10-CM | POA: Diagnosis not present

## 2022-05-07 DIAGNOSIS — E119 Type 2 diabetes mellitus without complications: Secondary | ICD-10-CM | POA: Insufficient documentation

## 2022-05-07 DIAGNOSIS — T7840XA Allergy, unspecified, initial encounter: Secondary | ICD-10-CM | POA: Diagnosis not present

## 2022-05-07 DIAGNOSIS — R410 Disorientation, unspecified: Secondary | ICD-10-CM | POA: Diagnosis not present

## 2022-05-07 DIAGNOSIS — Z79899 Other long term (current) drug therapy: Secondary | ICD-10-CM | POA: Insufficient documentation

## 2022-05-07 DIAGNOSIS — Z7951 Long term (current) use of inhaled steroids: Secondary | ICD-10-CM | POA: Diagnosis not present

## 2022-05-07 DIAGNOSIS — I2510 Atherosclerotic heart disease of native coronary artery without angina pectoris: Secondary | ICD-10-CM | POA: Insufficient documentation

## 2022-05-07 DIAGNOSIS — Z1152 Encounter for screening for COVID-19: Secondary | ICD-10-CM | POA: Insufficient documentation

## 2022-05-07 DIAGNOSIS — Z7902 Long term (current) use of antithrombotics/antiplatelets: Secondary | ICD-10-CM | POA: Insufficient documentation

## 2022-05-07 DIAGNOSIS — Z7982 Long term (current) use of aspirin: Secondary | ICD-10-CM | POA: Insufficient documentation

## 2022-05-07 DIAGNOSIS — I110 Hypertensive heart disease with heart failure: Secondary | ICD-10-CM | POA: Insufficient documentation

## 2022-05-07 DIAGNOSIS — R079 Chest pain, unspecified: Secondary | ICD-10-CM | POA: Diagnosis not present

## 2022-05-07 DIAGNOSIS — R55 Syncope and collapse: Secondary | ICD-10-CM | POA: Diagnosis not present

## 2022-05-07 LAB — COMPREHENSIVE METABOLIC PANEL
ALT: 18 U/L (ref 0–44)
AST: 30 U/L (ref 15–41)
Albumin: 4 g/dL (ref 3.5–5.0)
Alkaline Phosphatase: 73 U/L (ref 38–126)
Anion gap: 12 (ref 5–15)
BUN: 18 mg/dL (ref 8–23)
CO2: 23 mmol/L (ref 22–32)
Calcium: 9 mg/dL (ref 8.9–10.3)
Chloride: 102 mmol/L (ref 98–111)
Creatinine, Ser: 0.99 mg/dL (ref 0.44–1.00)
GFR, Estimated: 60 mL/min — ABNORMAL LOW (ref >60)
Glucose, Bld: 112 mg/dL — ABNORMAL HIGH (ref 70–99)
Potassium: 4.1 mmol/L (ref 3.5–5.1)
Sodium: 137 mmol/L (ref 135–145)
Total Bilirubin: 0.6 mg/dL (ref 0.3–1.2)
Total Protein: 6.8 g/dL (ref 6.5–8.1)

## 2022-05-07 LAB — CBC WITH DIFFERENTIAL/PLATELET
Abs Immature Granulocytes: 0.05 10*3/uL (ref 0.00–0.07)
Basophils Absolute: 0.1 10*3/uL (ref 0.0–0.1)
Basophils Relative: 1 %
Eosinophils Absolute: 0.3 10*3/uL (ref 0.0–0.5)
Eosinophils Relative: 3 %
HCT: 39 % (ref 36.0–46.0)
Hemoglobin: 12.7 g/dL (ref 12.0–15.0)
Immature Granulocytes: 1 %
Lymphocytes Relative: 21 %
Lymphs Abs: 2.1 10*3/uL (ref 0.7–4.0)
MCH: 27.7 pg (ref 26.0–34.0)
MCHC: 32.6 g/dL (ref 30.0–36.0)
MCV: 85 fL (ref 80.0–100.0)
Monocytes Absolute: 0.7 10*3/uL (ref 0.1–1.0)
Monocytes Relative: 7 %
Neutro Abs: 6.9 10*3/uL (ref 1.7–7.7)
Neutrophils Relative %: 67 %
Platelets: 208 10*3/uL (ref 150–400)
RBC: 4.59 MIL/uL (ref 3.87–5.11)
RDW: 13.9 % (ref 11.5–15.5)
WBC: 10 10*3/uL (ref 4.0–10.5)
nRBC: 0 % (ref 0.0–0.2)

## 2022-05-07 LAB — RESP PANEL BY RT-PCR (RSV, FLU A&B, COVID)  RVPGX2
Influenza A by PCR: NEGATIVE
Influenza B by PCR: NEGATIVE
Resp Syncytial Virus by PCR: NEGATIVE
SARS Coronavirus 2 by RT PCR: NEGATIVE

## 2022-05-07 LAB — URINALYSIS, ROUTINE W REFLEX MICROSCOPIC
Bilirubin Urine: NEGATIVE
Glucose, UA: NEGATIVE mg/dL
Hgb urine dipstick: NEGATIVE
Ketones, ur: NEGATIVE mg/dL
Leukocytes,Ua: NEGATIVE
Nitrite: NEGATIVE
Protein, ur: NEGATIVE mg/dL
Specific Gravity, Urine: 1.018 (ref 1.005–1.030)
pH: 6 (ref 5.0–8.0)

## 2022-05-07 LAB — MAGNESIUM: Magnesium: 2.2 mg/dL (ref 1.7–2.4)

## 2022-05-07 LAB — LIPASE, BLOOD: Lipase: 40 U/L (ref 11–51)

## 2022-05-07 LAB — TROPONIN I (HIGH SENSITIVITY)
Troponin I (High Sensitivity): 4 ng/L (ref <18)
Troponin I (High Sensitivity): 4 ng/L (ref <18)

## 2022-05-07 MED ORDER — SODIUM CHLORIDE 0.9 % IV BOLUS
1000.0000 mL | Freq: Once | INTRAVENOUS | Status: AC
  Administered 2022-05-07: 1000 mL via INTRAVENOUS

## 2022-05-07 MED ORDER — ACETAMINOPHEN 500 MG PO TABS
1000.0000 mg | ORAL_TABLET | Freq: Once | ORAL | Status: AC
  Administered 2022-05-07: 1000 mg via ORAL
  Filled 2022-05-07: qty 2

## 2022-05-07 NOTE — Discharge Instructions (Addendum)
You came to the emergency department today with a potential allergic reaction and changes in behavior.  Your workup today is reassuring.  There are no signs of UTI, your cardiac workup is normal and your electrolytes are within normal limits.  As we discussed you do have some chronic atrophy in your brain. This is not a stroke or diagnosis of dementia by any means however I would like you to follow-up with neurology. Dr. Clydene Fake number is on these papers,  I suspect your episode tonight to be secondary to an allergic reaction to the [employer] wrap that you ate.  Please avoid this food in the future.  Please make a follow-up with your PCP for reevaluation as well.  Do not hesitate to return with any recurring or worsening symptoms, especially shortness of breath, swelling of your lips or mouth, uncontrolled vomiting or diarrhea, severe headaches, blurred vision, numbness or weakness on one side of your body.  It was a pleasure to meet you and we hope you feel better!

## 2022-05-07 NOTE — ED Triage Notes (Addendum)
Patient BIB [facility] c/o shob and being itchy.  EMS reports patient had near syncopal episode with hypotension of 89/p. Patient also reports n/v/d. EMS reports mild confusion en route.  88/53 50 mg benadryl PO 4 mg zofran 250 mL LR

## 2022-05-07 NOTE — ED Notes (Signed)
Patient arrived via ems from home c/o urticaria and nausea. Pt was given benadryl zofran and fluids en route. Patient is a/o x 4 respirations even and non labored vs wnl still reports nausea but no there concerns

## 2022-05-07 NOTE — ED Provider Notes (Signed)
[facility] Provider Note   CSN: [id number] Arrival date & time: 05/07/22  0114     History  Chief Complaint  Patient presents with   Near Syncope   Allergic Reaction    Sheila Reynolds is a 77 y.o. female with a past medical history of sarcoidosis, CAD, type 2 diabetes, hypertension and CHF presenting today with near syncope/altered mental status.  Patient's son said that this evening he was outside with the dogs when the patient called him and said "I do not feel very good."  She was unable to specify what did not feel good.  She then had to sit at the top of the stairs and he called EMS.  Patient currently denies any pain but says that she feels cold and "different."  Family reports history of "multiple small strokes."  No history of ACS to their knowledge.  Family does report nausea, vomiting and diarrhea while en route.  Patient send patient does not remember any emesis.  Patient also declines any chest pain, shortness of breath or itchiness that was reported by EMS.  Family also reports that she seemed somewhat confused and was unable to state who her granddaughter was who is at bedside.   Near Syncope  Allergic Reaction      Home Medications Prior to Admission medications   Medication Sig Start Date End Date Taking? Authorizing Provider  acetaminophen (TYLENOL) 500 MG tablet Take 1,000 mg by mouth 2 (two) times daily as needed for moderate pain or headache.    [provider]  albuterol (PROVENTIL) (2.5 MG/3ML) 0.083% nebulizer solution Take 3 mLs (2.5 mg total) by nebulization daily as needed for wheezing or shortness of breath. 04/01/21   [name], FNP  albuterol (VENTOLIN HFA) 108 (90 Base) MCG/ACT inhaler Inhale 2 puffs into the lungs every 6 (six) hours as needed for wheezing or shortness of breath. 12/30/19   Deneise Lever, MD  ALPRAZolam Duanne Moron) 1 MG tablet Take by mouth. Patient not taking: Reported on  04/30/2022    [provider]  ANORO ELLIPTA 62.5-25 MCG/ACT AEPB INHALE 1 PUFF INTO THE LUNGS DAILY AT 6 AM 02/21/22   [name], Hart Robinsons, MD  aspirin EC 81 MG tablet Take 81 mg by mouth daily. Swallow whole.    [provider]  atorvastatin (LIPITOR) 80 MG tablet Take 1 tablet (80 mg total) by mouth daily. 08/23/21   [name], Wonda Cheng, MD  azaTHIOprine (IMURAN) 50 MG tablet Take by mouth. 11/21/21   [provider]  azelastine (ASTELIN) 0.1 % nasal spray Place 1 spray into both nostrils 2 (two) times daily. Use in each nostril as directed 01/14/21   Glendale Chard, MD  azithromycin (ZITHROMAX) 250 MG tablet See admin instructions. Patient not taking: Reported on 04/30/2022    [provider]  benzonatate (TESSALON) 100 MG capsule Take 1 capsule by mouth every 8 (eight) hours for cough. 01/30/22   Vanessa Kick, MD  Blood Glucose Monitoring Suppl (ACCU-CHEK AVIVA PLUS) w/Device KIT Use to check blood sugars 3 times a day. Dx code e11.65 01/03/20   Glendale Chard, MD  brimonidine (ALPHAGAN P) 0.1 % SOLN Place 1 drop into both eyes 2 (two) times daily.     [provider]  brinzolamide (AZOPT) 1 % ophthalmic suspension Place 1 drop into both eyes 3 (three) times daily.    [provider]  carvedilol (COREG) 25 MG tablet Take 1 tablet (25 mg total) by mouth  2 (two) times daily. 01/01/22   Jettie Booze, MD  cetirizine (ZYRTEC ALLERGY) 10 MG tablet Take 1 tablet (10 mg total) by mouth daily. 10/18/21 10/18/22  Glendale Chard, MD  clopidogrel (PLAVIX) 75 MG tablet TAKE 1 TABLET(75 MG) BY MOUTH DAILY 09/06/21   [name], Wonda Cheng, MD  doxycycline (VIBRA-TABS) 100 MG tablet Take 1 tablet (100 mg total) by mouth 2 (two) times daily. Patient not taking: Reported on 04/30/2022 02/20/22   Baird Lyons D, MD  EPINEPHrine 0.3 mg/0.3 mL IJ SOAJ injection Inject 0.3 mg into the muscle as needed for anaphylaxis. 09/12/20   Bary Castilla, NP  ezetimibe (ZETIA) 10 MG  tablet Take 1 tablet (10 mg total) by mouth daily. 08/23/21   [name], Wonda Cheng, MD  fluconazole (DIFLUCAN) 150 MG tablet Take 1 tablet (150 mg total) by mouth daily. Patient not taking: Reported on 04/30/2022 02/20/22   Deneise Lever, MD  furosemide (LASIX) 40 MG tablet Take 1 tablet (40 mg total) by mouth daily as needed. 03/28/22   [name], Wonda Cheng, MD  isosorbide mononitrate (IMDUR) 60 MG 24 hr tablet TAKE 1 TABLET(60 MG) BY MOUTH DAILY 09/06/21   [name], Wonda Cheng, MD  JANUMET 50-500 MG tablet TAKE 1 TABLET BY MOUTH TWICE DAILY WITH A MEAL 05/05/22   Glendale Chard, MD  ketorolac (ACULAR) 0.5 % ophthalmic solution Place 1 drop into the right eye 3 (three) times daily. 08/02/20   [provider]  Lancets (ONETOUCH DELICA PLUS [id number]) [place] BREAKFAST [employer] DIN[employer] 09/12/20   Bary Castilla, NP  montelukast (SINGULAIR) 10 MG tablet TAKE 1 TABLET BY MOUTH EVERY DAY 02/17/22   Glendale Chard, MD  Nebulizers (COMPRESSOR/NEBULIZER) MISC Use as directed 04/22/18   Deneise Lever, MD  nitroGLYCERIN (NITROSTAT) 0.4 MG SL tablet Place 1 tablet (0.4 mg total) under the tongue every 5 (five) minutes as needed for chest pain. 07/25/19 04/01/22  Glendale Chard, MD  pantoprazole (PROTONIX) 40 MG tablet TAKE 1 TABLET(40 MG) BY MOUTH DAILY 09/06/21   [name], Wonda Cheng, MD  Polyvinyl Alcohol-Povidone PF (REFRESH) 1.4-0.6 % SOLN Place 1-2 drops into both eyes 3 (three) times daily as needed (dry/irritated eyes.).    [provider]  potassium chloride SA (KLOR-CON) 20 MEQ tablet Take 1 tablet (20 mEq total) by mouth every other day. 06/16/19   [name], Wonda Cheng, MD  prednisoLONE acetate (PRED FORTE) 1 % ophthalmic suspension Apply to eye. 10/11/20   [provider]  pregabalin (LYRICA) 75 MG capsule TAKE 1 CAPSULE(75 MG) BY MOUTH TWICE DAILY 12/24/21   Glendale Chard, MD  RHOPRESSA 0.02 % SOLN Place 1 drop into the left eye at bedtime. 05/27/21   [provider]   tiZANidine (ZANAFLEX) 4 MG tablet TAKE 1 TABLET(4 MG) BY MOUTH DAILY 05/05/22   Glendale Chard, MD  traMADol (ULTRAM) 50 MG tablet Take by mouth.    [provider]  Travoprost, BAK Free, (TRAVATAN) 0.004 % SOLN ophthalmic solution Place 1 drop into the right eye at bedtime. 12/29/19   [provider]      Allergies    Crestor [rosuvastatin calcium], Demerol  [meperidine hcl], and Shellfish allergy    Review of Systems   Review of Systems  Cardiovascular:  Positive for near-syncope.    Physical Exam Updated Vital Signs BP 125/68   Pulse 75   Temp (!) 97.5 F (36.4 C) (Oral)   Resp 13   Ht 5\' 5"  (1.651 m)   Wt  74 kg   SpO2 100%   BMI 27.15 kg/m  Physical Exam Vitals and nursing note reviewed.  Constitutional:      General: She is not in acute distress.    Appearance: Normal appearance. She is not ill-appearing.  HENT:     Head: Normocephalic and atraumatic.     Mouth/Throat:     Mouth: Mucous membranes are moist.     Pharynx: Oropharynx is clear.     Comments: Airway clear, tolerating secretions.  No signs of angioedema. Eyes:     General: No scleral icterus.    Conjunctiva/sclera: Conjunctivae normal.  Cardiovascular:     Rate and Rhythm: Normal rate and regular rhythm.  Pulmonary:     Effort: Pulmonary effort is normal. No respiratory distress.     Breath sounds: No wheezing or rales.  Abdominal:     General: Abdomen is flat.     Palpations: Abdomen is soft.     Tenderness: There is no abdominal tenderness.  Skin:    General: Skin is warm and dry.     Findings: No rash.  Neurological:     General: No focal deficit present.     Mental Status: She is alert and oriented to person, place, and time.     Comments: Cranial nerves II through XII grossly intact.  No nystagmus on EOM testing.  Some sluggish movements with finger/nose testing.  Questionable dysmetria on the right upper extremity.  Normal strength in bilateral upper and lower extremities.   Alert and oriented, no facial droop or aphasia.  Normal strength in bilateral upper and lower extremities  Psychiatric:        Mood and Affect: Mood normal.     ED Results / Procedures / Treatments   Labs (all labs ordered are listed, but only abnormal results are displayed) Labs Reviewed  COMPREHENSIVE METABOLIC PANEL - Abnormal; Notable for the following components:      Result Value   Glucose, Bld 112 (*)    GFR, Estimated 60 (*)    All other components within normal limits  RESP PANEL BY RT-PCR (RSV, FLU A&B, COVID)  [id number]  CBC WITH DIFFERENTIAL/PLATELET  LIPASE, BLOOD  URINALYSIS, ROUTINE W REFLEX MICROSCOPIC  MAGNESIUM  CBG MONITORING, ED  TROPONIN I (HIGH SENSITIVITY)  TROPONIN I (HIGH SENSITIVITY)    EKG None  Radiology CT Head Wo Contrast  Result Date: 05/07/2022 CLINICAL DATA:  Near syncopal episode with confusion, initial encounter EXAM: CT HEAD WITHOUT CONTRAST TECHNIQUE: Contiguous axial images were obtained from the base of the skull through the vertex without intravenous contrast. RADIATION DOSE REDUCTION: This exam was performed according to the departmental dose-optimization program which includes automated exposure control, adjustment of the mA and/or kV according to patient size and/or use of iterative reconstruction technique. COMPARISON:  02/26/2021 FINDINGS: Brain: No evidence of acute infarction, hemorrhage, hydrocephalus, extra-axial collection or mass lesion/mass effect. Chronic atrophic and ischemic changes are noted. Vascular: No hyperdense vessel or unexpected calcification. Skull: Normal. Negative for fracture or focal lesion. Sinuses/Orbits: No acute finding. Other: None. IMPRESSION: Chronic atrophic and ischemic changes without acute abnormality. Electronically Signed   By: Inez Catalina M.D.   On: 05/07/2022 03:56   DG Chest Portable 1 View  Result Date: 05/07/2022 CLINICAL DATA:  Chest pain EXAM: PORTABLE CHEST 1 VIEW COMPARISON:  03/27/2022  FINDINGS: The heart size and mediastinal contours are within normal limits. Both lungs are clear. The visualized skeletal structures are unremarkable. IMPRESSION: No active disease. Electronically Signed  By: Inez Catalina M.D.   On: 05/07/2022 02:32    Procedures Procedures   Medications Ordered in ED Medications  sodium chloride 0.9 % bolus 1,000 mL (1,000 mLs Intravenous New Bag/Given 05/07/22 0333)  acetaminophen (TYLENOL) tablet 1,000 mg (1,000 mg Oral Given 05/07/22 0502)    ED Course/ Medical Decision Making/ A&P Clinical Course as of 05/07/22 0509  Wed May 07, 2022  0250 Normal CBC [MR]  [id number] Patient's husband is now at bedside.  He tells me that about an hour to an hour and a half prior to arrival patient ate a wrap from [employer].  This is not something new but he says that she was complaining of some shortness of breath so she used her inhaler multiple times.  She also had episodes of emesis and diarrhea.  He says that she continued to say that she "just did not feel right."  He says that when she laid on the ground she started to feel much better.  He reports that he tested her vision, strength and did not note any facial droop that would make him concern for stroke.  He also says her blood pressure was initially elevated on EMS arrival but he was told that it decreased while she was on the way here.  Only known allergy is shellfish. [MR]  [phone] Patient just returned from CT scan is complaining of a headache.  She is requesting a Tylenol.  Continues to have a stable neuroexam.  She reports that she is willing to try and urinate [MR]    Clinical Course User Index [MR] Devin Ganaway, Cecilio Asper, PA-C                             Medical Decision Making Amount and/or Complexity of Data Reviewed Labs: ordered. Radiology: ordered.  Risk OTC drugs.   77 year old female who presents today with altered mental status/potential near syncope?  Differential includes but is not limited to anemia,  infection, CVA, ACS, UTI.  This is not an exhaustive differential.    Past Medical History / Co-morbidities / Social History: CAD, hypertension, type 2 diabetes, sarcoidosis, interstitial lung disease   Additional history: Per chart review patient has had episodes of "imbalance", low hemoglobin and memory concerns.  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  She was started on Plavix for stroke prevention.  No evidence of seizure like activity on EEG at that time   Physical Exam: Pertinent physical exam findings include Patient is alert and oriented.  Some dysmetria with right upper extremity on finger-nose testing.  Does not appear to have a visual field deficit, EOMs intact.  Cranial nerves II through XII appear to be fully intact. Lethargic  Lab Tests: I ordered, and personally interpreted labs.  The pertinent results include: Normal electrolytes Normal hemoglobin Normal WBC Negative troponin    Imaging Studies: I ordered and independently visualized and interpreted chest x-ray and I agree with the radiologist that there are no acute findings  CT head ordered, viewed and interpreted by me.   Cardiac Monitoring:  The patient was maintained on a cardiac monitor.  I viewed and interpreted the cardiac monitored which showed an underlying rhythm of: Sinus   Medications: Patient is asymptomatic at this time.  No medications ordered.  Did order IVF  MDM/Disposition: This is a 77 year old female who presented today feeling "different."  Patient's husband is present and able to provide more history than on  arrival.  Patient has an allergy to shellfish and started to feel abnormal after eating a fried [employer] wrap for dinner.  She had never had this before.  After eating this she had episodes of emesis, diarrhea, pruritus and reported shortness of breath.  All of this resolved prior to patient's arrival.  Family reported that she was having multiple "staring and altered  moments of confusion."  Differential is broad and inclusive of CVA, pneumonia, viral illness, allergic reaction and UTI.  Chest x-ray without abnormalities.  Troponin negative x 2.  Abdominal pain labs negative for explanation of her emesis and diarrhea.  CT head without acute findings, some chronic atrophic changes.  Urinalysis negative for UTI.  Patient's neurologic exam was relatively benign.  She did have some sluggish movements with finger-nose and may be very minor dysmetria with right upper extremity.  This improved on reevaluation, I have some suspicion for difficulty following the instructions.  Low suspicion CVA and do not believe she needs angiogram or MRI at this time.  I do question whether or not the patient has some developing dementia which can be further evaluated outpatient.  She is already established with neurology.  At this time I suspect patient's presentation is secondary to an allergic reaction with questionable contamination in [employer] fryer.  Currently without signs of anaphylaxis.  Do not believe she needs steroid or antihistamine treatment at this time.  She has a history of diabetes so do not believe she would benefit from outpatient prednisone burst.  She will be discharged at this time with strict return precautions.  Suggested close follow-up with PCP and neurology for any further outpatient workup.   Final Clinical Impression(s) / ED Diagnoses Final diagnoses:  Allergic reaction, initial encounter    Rx / DC Orders ED Discharge Orders     None      Results and diagnoses were explained to the patient. Return precautions discussed in full. Patient had no additional questions and expressed complete understanding.   This chart was dictated using voice recognition software.  Despite best efforts to proofread,  errors can occur which can change the documentation meaning.    Rhae Hammock, PA-C 05/07/22 0510    Merryl Hacker, MD 05/08/22 [phone]

## 2022-05-08 ENCOUNTER — Telehealth: Payer: Self-pay | Admitting: Adult Health

## 2022-05-08 ENCOUNTER — Ambulatory Visit: Payer: Self-pay | Admitting: *Deleted

## 2022-05-08 ENCOUNTER — Ambulatory Visit: Payer: Medicare Other | Admitting: Internal Medicine

## 2022-05-08 ENCOUNTER — Telehealth: Payer: Self-pay

## 2022-05-08 NOTE — Telephone Encounter (Signed)
Spoke with patient yesterday to get her scheduled for a ED f/u appt. Spoke with Frann Rider, NP regarding her vsiti and had her review- per Janett Billow schedule with Dr. Leonie Man for eval. She has had extensive workup for memory loss concerns. May need to be worked up for seizures based on ED report? Appt on hold for patient next Thursday with Dr. Leonie Man. I had to LVM for patient

## 2022-05-08 NOTE — Chronic Care Management (AMB) (Signed)
   05/08/2022  Sheila Reynolds 12-07-1945 [id number]   Enrollment status changed to previously enrolled  Jacqlyn Lars[facility], BSN RN Case Manager, CCM [phone]

## 2022-05-11 ENCOUNTER — Encounter: Payer: Self-pay | Admitting: Cardiovascular Disease

## 2022-05-11 NOTE — Progress Notes (Signed)
This encounter was created in error - please disregard.

## 2022-05-12 ENCOUNTER — Ambulatory Visit: Payer: Medicare Other | Attending: Cardiovascular Disease | Admitting: Cardiovascular Disease

## 2022-05-12 ENCOUNTER — Telehealth: Payer: Self-pay

## 2022-05-12 NOTE — Telephone Encounter (Signed)
     Patient  visit on 3/27  at [facility]   Have you been able to follow up with your primary care physician? No   The patient was or was not able to obtain any needed medicine or equipment. Yes   Are there diet recommendations that you are having difficulty following? Na   Patient expresses understanding of discharge instructions and education provided has no other needs at this time.  Yes    .al

## 2022-05-15 ENCOUNTER — Ambulatory Visit (INDEPENDENT_AMBULATORY_CARE_PROVIDER_SITE_OTHER): Payer: Medicare Other | Admitting: Neurology

## 2022-05-15 ENCOUNTER — Encounter: Payer: Self-pay | Admitting: Neurology

## 2022-05-15 ENCOUNTER — Telehealth: Payer: Self-pay | Admitting: Neurology

## 2022-05-15 VITALS — BP 160/83 | HR 68 | Wt 167.0 lb

## 2022-05-15 DIAGNOSIS — S92909A Unspecified fracture of unspecified foot, initial encounter for closed fracture: Secondary | ICD-10-CM | POA: Insufficient documentation

## 2022-05-15 DIAGNOSIS — R569 Unspecified convulsions: Secondary | ICD-10-CM

## 2022-05-15 DIAGNOSIS — N951 Menopausal and female climacteric states: Secondary | ICD-10-CM | POA: Insufficient documentation

## 2022-05-15 DIAGNOSIS — Z8673 Personal history of transient ischemic attack (TIA), and cerebral infarction without residual deficits: Secondary | ICD-10-CM

## 2022-05-15 DIAGNOSIS — B3731 Acute candidiasis of vulva and vagina: Secondary | ICD-10-CM | POA: Insufficient documentation

## 2022-05-15 DIAGNOSIS — E1165 Type 2 diabetes mellitus with hyperglycemia: Secondary | ICD-10-CM | POA: Insufficient documentation

## 2022-05-15 DIAGNOSIS — K469 Unspecified abdominal hernia without obstruction or gangrene: Secondary | ICD-10-CM | POA: Insufficient documentation

## 2022-05-15 DIAGNOSIS — G40209 Localization-related (focal) (partial) symptomatic epilepsy and epileptic syndromes with complex partial seizures, not intractable, without status epilepticus: Secondary | ICD-10-CM | POA: Insufficient documentation

## 2022-05-15 DIAGNOSIS — Z79899 Other long term (current) drug therapy: Secondary | ICD-10-CM | POA: Insufficient documentation

## 2022-05-15 DIAGNOSIS — R1900 Intra-abdominal and pelvic swelling, mass and lump, unspecified site: Secondary | ICD-10-CM | POA: Insufficient documentation

## 2022-05-15 DIAGNOSIS — R32 Unspecified urinary incontinence: Secondary | ICD-10-CM | POA: Insufficient documentation

## 2022-05-15 DIAGNOSIS — R413 Other amnesia: Secondary | ICD-10-CM | POA: Diagnosis not present

## 2022-05-15 DIAGNOSIS — I119 Hypertensive heart disease without heart failure: Secondary | ICD-10-CM | POA: Insufficient documentation

## 2022-05-15 DIAGNOSIS — H9190 Unspecified hearing loss, unspecified ear: Secondary | ICD-10-CM | POA: Insufficient documentation

## 2022-05-15 DIAGNOSIS — B0222 Postherpetic trigeminal neuralgia: Secondary | ICD-10-CM | POA: Insufficient documentation

## 2022-05-15 MED ORDER — LEVETIRACETAM ER 500 MG PO TB24: 500.0000 mg | ORAL_TABLET | Freq: Every day | ORAL | 5 refills | Status: DC

## 2022-05-15 NOTE — Progress Notes (Signed)
[facility] [address] [place]. [place] [address] [phone]       OFFICE FOLLOW UP NOTE  Ms. ABBRA BERTHELOT Date of Birth:  1945/10/29 Medical Record Number:  [id number]   Referring MD:  Glendale Chard  Reason for visit: Memory loss  Chief Complaint  Patient presents with   Hospitalization Follow-up    Rm 68, daughter present  Memory concerns MMSE 27      HPI:  Update 05/15/2022 : She returns for follow-up after last visit 8 months ago.  She is accompanied by her daughter Cyril Mourning.  Patient continues to have mild cognitive and short-term memory difficulties.  She has good days and bad days.  She may often remember things later.  Is quite independent in activities of daily living.  She can drive and cook.  Last month she had an episode where her granddaughter noticed that she was staring and had a spacey look on her face.  She is not responding when spoken to and following commands.  She remains confused and disoriented for short time.  daughter she has had at least 3 Episodes.  One of them occurred in the setting of urinary tract infection while admitted to outside hospital.  No episodes later on the same day as the last 1.  Review of ER visit note from 05/07/2022 states that the patient had altered mental status possibly presyncopal event.  CT scan of the head showed chronic white matter changes and atrophy changes without acute abnormality.  UA, CBC and BMP labs unremarkable.  Review of [facility] admission note from 11/04/2021 mentions reason for admission as drowsiness and dizziness with urinary tract infection and pneumonitis no mention of seizure-like episode.  CT head during that admission also showed no acute abnormalities.  Patient has no prior known history of seizures or episodes of loss of consciousness or significant head injury.  On cognitive testing today she did quite well and scored 27/30 on MMSE this however slight decline from 30/30 last  visit on 09/18/2021.  Update 09/18/2021 JM: Patient scheduled today's visit after prior visit with Dr. Leonie Man 10 months ago for concern of memory loss and brain fog.   Feels like having a harder time "registering" what is being said to her during conversation. She has little motivation to do activity or do prior things she was interested in such as going to see a movie or going to a football game. She still manages/rents houses, has had a hard time getting things done such as repairs either due to lack of motivation or will forget she was supposed to do certain tasks.  She does admit to poor sleep, wakes up frequently throughout the night, at times only gets 3-4 hrs.  Admits to intermittent use of CPAP as she has a difficulty tolerating.  Plans on repeating HST to see if apnea still present as initial AHI low.  She does admit to increased stressors at home with family as well as likely underlying depression.  Geriatric depression score 9.  She voices frustration regarding her overall health and multiple health issues.  Does report her father had dementia which did not develop towards end-of-life while in nursing home.  No other family history of dementia.  She is greatly concerned/worried that she has developed dementia.  MMSE today 30/30 (prior 27/30 03/2020) Extensive evaluation back in 03/2020 for complaints of memory loss largely unremarkable PCP completed B12 and TSH which were normal.   Reports headaches have been off and  on. Usually has 1 headache per week but not always severe. She believes majority are coming from her poor eyesight.  She is being closely followed by ophthalmology.  No change in headache frequency or characteristics since prior visit. She stopped taking topamax since prior visit as she did not feel it was helping her headaches. She was seen in ED 1/17 with dizziness and headache with workup largely unremarkable and headache improved after migraine cocktail.  Towards the end of visit,  she also mentions continued poor balance which is not a new issue and denies any worsening.  She questions trying physical therapy for possible benefit.  No other concerns at this time      History provided for reference purposes only Update 11/15/2020 Dr. Leonie Man She returns for follow-up after last visit with Janett Billow nurse practitioner on 09/04/2020.  She states headaches are slightly better but still occur 2-3 times per week particularly at night she often has to wake up.  She has to take 2 tablets of Tylenol which seems to help and she is able to go back to sleep.  She also has to put prednisone drops into her eyes which also helped.  She did try Topamax and did all right for a while she was taking 1 tablet a day and it seemed to help but when she increase it to 2 tablets a day it made her quite sleepy and hence she stopped it after 2 weeks.  She does complain of some posterior neck muscle tightness and pain which she has not been doing any neck stretching exercises.  She continues to have mild gait and balance difficulties but these are longstanding and unchanged.  She has no new complaints.   Update 09/03/2020 JM: Ms. Swilley returns for sooner scheduled visit for headaches. She reports being seen by her ophthalmologist who advised her to follow-up with our office for further evaluation.  Extensive work-up completed after prior visit in 04/2020 including MR brain, MR cervical, EEG and lab work which was all unremarkable.  Reports over the past 3 months, she has been experiencing left orbital and periorbital headaches.  Initially experiencing them daily but have since been gradually improving.  She may experience dull headache daily but at times can be severe throbbing headache accompanied by blurred vision but denies actual loss of vision.  She was evaluated by her ophthalmologist with history of open-angle glaucoma bilaterally with recent exam normal IOP bilaterally.  She also has chronic iritis  bilaterally with use of prednisone drops which she reports will improve orbital headache.  She also intermittently use Tylenol with benefit.  Denies photophobia, phonophobia or N/V.  She was seen by her PCP who did lab work on 6/27 with a normal CRP and sed rate.  Update 04/03/2020 Dr. Leonie Man: She returns for follow-up after last visit year and half ago.  She has new complaints of worsening balance, dizziness, memory as well as headaches and neck pain.  She states her balance is poor and she has had a few falls but no injuries.  She has to be careful and often can catch her self and does not fall.  She denies any significant neck pain radiating down her spine but does complain of tightness in the neck and trouble moving her neck.  She has had no recent neck x-rays or scans done.  She also complains of posterior headaches as well as bifrontal headaches which are intermittent and last only few minutes.  She takes some Tylenol which seems  to help.  Cold compression also seems to help her headaches.  She did undergo some carotid ultrasound at last visit after she saw me on 10/29/2018 which showed no significant extracranial stenosis and transcranial Doppler study was also done which showed poor left improvement of but normal velocities in the vessels that could be studied.  On inquiry she admits to numbness in the right hand which is intermittent but more in the night.  She does agree that the numbness seems to more prominent when she performs activities with rapid repetitive wrist flexion.  Initial visit 10/20/2018 Dr. Leonie Man: Ms. Charles is a 77 year old African-American lady seen today for initial office consultation visit for stroke.  She is accompanied by her husband today.  History is obtained from them and review of referral notes and have personally reviewed imaging films in PACS.  The patient had been having steady decline in the vision acuity in the right eye and for which she was seen by neuro-ophthalmology  recently.  They ordered an MRI of the brain and orbits which was done on 10/05/2018 which I personally reviewed MRI of the orbits was unremarkable but MRI scan of the brain showed a small left thalamic acute infarct.  Patient had no neurological symptoms at that time and this is likely an incidental finding.  Patient denied any symptoms of right-sided numbness, tingling, weakness gait or balance problems or memory problems.  She thought a few days prior to her MRI she had some twitchings in the left side of the face but this lasted only a few hours and recovered.  She denied any increased watering of the eyes lack of sensation or weakness.  Patient has no prior history of strokes, TIAs, seizures, migraines or significant neurological problems.  She had lab work on 10/07/2018 which showed LDL cholesterol of 71 mg percent.  She takes Lipitor 40 mg daily.  She had a hemoglobin A1c of 6.1 on 07/20/2018.  Her blood pressure is usually well controlled and today it is 134/77.  Her fasting sugars have all been good in the 110 range.  She also has sleep apnea and is quite compliant with her CPAP and takes it every night.  She has not had any vascular imaging studies done for the brain of the neck.  She has no neurological complaints today.  ROS:   14 system review of systems is positive for those listed in HPI and all other systems negative e and all other systems negative  PMH:  Past Medical History:  Diagnosis Date   Anemia    3 months ago anemic   Anxiety    on meds   Arthritis    "all over" (01/15/2017)   Asthma    Bronchitis with emphysema    Chest pain    Chronic bronchitis    Coronary artery disease    a. 01/2017 she underwent orbital atherectomy/DES to the proxmal LAD and PTCA to ostial D2. 2D Echo 01/15/17 showed mild LVH, EF 60-65%, grade 1 DD.   Family history of anesthesia complication    daughter N/V   Fibromyalgia    GERD (gastroesophageal reflux disease)    on meds   Glaucoma    Heart  murmur    History of hiatal hernia    Hx of echocardiogram    Echo (03/2013):  Tech limited; Mild focal basal septal hypertrophy, EF 60-65%, normal RVF   Hyperlipidemia    Hypertension    Nonsustained ventricular tachycardia    OSA on CPAP  Premature atrial contractions    PVC (premature ventricular contraction)    a. Holter 12/16: NSR, occ PAC,PVCs   Sarcoidosis    Type II diabetes mellitus     Social History:  Social History   Socioeconomic History   Marital status: Married    Spouse name: Gerald Stabs   Number of children: 2   Years of education: Not on file   Highest education level: Not on file  Occupational History   Occupation: unemployed    Employer: [employer]  Tobacco Use   Smoking status: Former    Packs/day: 1.00    Years: 4.00    Additional pack years: 0.00    Total pack years: 4.00    Types: Cigarettes    Quit date: 02/11/1980    Years since quitting: 42.2   Smokeless tobacco: Never  Vaping Use   Vaping Use: Never used  Substance and Sexual Activity   Alcohol use: No   Drug use: No   Sexual activity: Yes  Other Topics Concern   Not on file  Social History Narrative   Lives with husband   Right hand   Drinks 2-3 cups caffeine daily   Social Determinants of Health   Financial Resource Strain: Low Risk  (04/30/2022)   Overall Financial Resource Strain (CARDIA)    Difficulty of Paying Living Expenses: Not hard at all  Food Insecurity: No Food Insecurity (04/30/2022)   Hunger Vital Sign    Worried About Running Out of Food in the Last Year: Never true    [place] in the Last Year: Never true  Transportation Needs: No Transportation Needs (04/30/2022)   PRAPARE - Hydrologist (Medical): No    Lack of Transportation (Non-Medical): No  Physical Activity: Inactive (04/30/2022)   Exercise Vital Sign    Days of Exercise per Week: 0 days    Minutes of Exercise per Session: 0 min  Stress: Stress Concern  Present (04/30/2022)   [place]    Feeling of Stress : To some extent  Social Connections: Not on file  Intimate Partner Violence: Not At Risk (03/18/2018)   Humiliation, Afraid, Rape, and Kick questionnaire    Fear of Current or Ex-Partner: No    Emotionally Abused: No    Physically Abused: No    Sexually Abused: No    Medications:   Current Outpatient Medications on File Prior to Visit  Medication Sig Dispense Refill   acetaminophen (TYLENOL) 500 MG tablet Take 1,000 mg by mouth 2 (two) times daily as needed for moderate pain or headache.     albuterol (PROVENTIL) (2.5 MG/3ML) 0.083% nebulizer solution Take 3 mLs (2.5 mg total) by nebulization daily as needed for wheezing or shortness of breath. 75 mL 12   albuterol (VENTOLIN HFA) 108 (90 Base) MCG/ACT inhaler Inhale 2 puffs into the lungs every 6 (six) hours as needed for wheezing or shortness of breath. 18 g 12   ANORO ELLIPTA 62.5-25 MCG/ACT AEPB INHALE 1 PUFF INTO THE LUNGS DAILY AT 6 AM 60 each 3   aspirin EC 81 MG tablet Take 81 mg by mouth daily. Swallow whole.     atorvastatin (LIPITOR) 80 MG tablet Take 1 tablet (80 mg total) by mouth daily. 90 tablet 3   azaTHIOprine (IMURAN) 50 MG tablet Take by mouth.     azelastine (ASTELIN) 0.1 % nasal spray Place 1 spray into both nostrils 2 (two) times  daily. Use in each nostril as directed 30 mL 1   benzonatate (TESSALON) 100 MG capsule Take 1 capsule by mouth every 8 (eight) hours for cough. 21 capsule 0   Blood Glucose Monitoring Suppl (ACCU-CHEK AVIVA PLUS) w/Device KIT Use to check blood sugars 3 times a day. Dx code e11.65 1 kit 3   brimonidine (ALPHAGAN P) 0.1 % SOLN Place 1 drop into both eyes 2 (two) times daily.      brinzolamide (AZOPT) 1 % ophthalmic suspension Place 1 drop into both eyes 3 (three) times daily.     carvedilol (COREG) 25 MG tablet Take 1 tablet (25 mg total) by mouth 2 (two) times daily. 180  tablet 3   cetirizine (ZYRTEC ALLERGY) 10 MG tablet Take 1 tablet (10 mg total) by mouth daily. 30 tablet 2   clopidogrel (PLAVIX) 75 MG tablet TAKE 1 TABLET(75 MG) BY MOUTH DAILY 90 tablet 3   EPINEPHrine 0.3 mg/0.3 mL IJ SOAJ injection Inject 0.3 mg into the muscle as needed for anaphylaxis. 1 each 0   ezetimibe (ZETIA) 10 MG tablet Take 1 tablet (10 mg total) by mouth daily. 90 tablet 3   furosemide (LASIX) 40 MG tablet Take 1 tablet (40 mg total) by mouth daily as needed. 90 tablet 1   isosorbide mononitrate (IMDUR) 60 MG 24 hr tablet TAKE 1 TABLET(60 MG) BY MOUTH DAILY 90 tablet 3   JANUMET 50-500 MG tablet TAKE 1 TABLET BY MOUTH TWICE DAILY WITH A MEAL 90 tablet 1   ketorolac (ACULAR) 0.5 % ophthalmic solution Place 1 drop into the right eye 3 (three) times daily.     Lancets (ONETOUCH DELICA PLUS 123XX123) MISC CHECK BLOOD SUGAR BEFORE BREAKFAST AND DINNER 100 each 1   montelukast (SINGULAIR) 10 MG tablet TAKE 1 TABLET BY MOUTH EVERY DAY 90 tablet 1   Nebulizers (COMPRESSOR/NEBULIZER) MISC Use as directed 1 each 0   nitroGLYCERIN (NITROSTAT) 0.4 MG SL tablet Place 1 tablet (0.4 mg total) under the tongue every 5 (five) minutes as needed for chest pain. 25 tablet prn   pantoprazole (PROTONIX) 40 MG tablet TAKE 1 TABLET(40 MG) BY MOUTH DAILY 90 tablet 3   Polyvinyl Alcohol-Povidone PF (REFRESH) 1.4-0.6 % SOLN Place 1-2 drops into both eyes 3 (three) times daily as needed (dry/irritated eyes.).     potassium chloride SA (KLOR-CON) 20 MEQ tablet Take 1 tablet (20 mEq total) by mouth every other day. 90 tablet 1   prednisoLONE acetate (PRED FORTE) 1 % ophthalmic suspension Apply to eye.     pregabalin (LYRICA) 75 MG capsule TAKE 1 CAPSULE(75 MG) BY MOUTH TWICE DAILY 180 capsule 2   RHOPRESSA 0.02 % SOLN Place 1 drop into the left eye at bedtime.     tiZANidine (ZANAFLEX) 4 MG tablet TAKE 1 TABLET(4 MG) BY MOUTH DAILY 30 tablet 1   traMADol (ULTRAM) 50 MG tablet Take by mouth.     Travoprost,  BAK Free, (TRAVATAN) 0.004 % SOLN ophthalmic solution Place 1 drop into the right eye at bedtime.     No current facility-administered medications on file prior to visit.    Allergies:   Allergies  Allergen Reactions   Crestor [Rosuvastatin Calcium] Other (See Comments)    muscle aches   Demerol  [Meperidine Hcl]     Other reaction(s): Hallucinations   Shellfish Allergy Itching and Other (See Comments)    Crab, shrimp and lobster ---lips itch and tingle Was told not to eat again after having a allergy test.  Lobster, crab and shrimp     Physical Exam Today's Vitals   05/15/22 1312 05/15/22 1326  BP: (!) 174/90 (!) 160/83  Pulse: 69 68  Weight: 167 lb (75.8 kg)    Body mass index is 27.79 kg/m.    General: well developed, well nourished elderly African-American lady, seated, in no evident distress Head: head normocephalic and atraumatic.   Neck: supple with no carotid or supraclavicular bruits Cardiovascular: regular rate and rhythm, no murmurs Musculoskeletal: no deformity Skin:  no rash/petichiae Vascular:  Normal pulses all extremities  Neurologic Exam Mental Status: Awake and fully alert.  Fluent speech and language.  Oriented to place and time. Recent memory subjectively impaired and remote memory intact. Attention span, concentration and fund of knowledge appropriate during visit.  Provided extensive history and lots of information during visit without difficulty.  Mood and affect flat    05/15/2022    1:20 PM 09/18/2021    7:46 AM 04/03/2020    1:22 PM  MMSE - Mini Mental State Exam  Orientation to time 4 5 5   Orientation to Place 5 5 5   Registration 2 3 3   Attention/ Calculation 5 5 3   Recall 3 3 2   Language- name 2 objects 2 2 2   Language- repeat 1 1 1   Language- follow 3 step command 3 3 3   Language- read & follow direction 1 1 1   Write a sentence 1 1 1   Copy design 0 1 1  Total score 27 30 27    Cranial Nerves: Pupils equal, briskly reactive to light.  Extraocular movements full without nystagmus. Visual fields full to confrontation.  Diminished vision acuity bilaterally (chronic).  Hearing slightly diminished bilaterally. Facial sensation intact. Face, tongue, palate moves normally and symmetrically.  Motor: Normal bulk and tone. Normal strength in all tested extremity muscles. Sensory.: intact to touch , pinprick , position and vibratory sensation.   Coordination: Rapid alternating movements normal in all extremities. Finger-to-nose and heel-to-shin performed accurately bilaterally. Gait and Station: Arises from chair without difficulty. Stance is normal. Gait demonstrates normal stride length and mild imbalance without use of assistive device. Able to heel, toe and tandem walk with slight difficulty.  Reflexes: 1+ and symmetric. Toes downgoing.       IMAGING/LABS  MR brain w wo 04/2020 -chronic left thalamic lacunar infarct -negative for acute findings MR cervical 04/2020 - stable, mild disc bulging at C3-4 down to C6-7 EEG 04/2020  unremarkable Lab work 04/2020 WNL (sed rate, dementia panel, lipid panel and A1c)      05/15/2022    1:20 PM 09/18/2021    7:46 AM 04/03/2020    1:22 PM  MMSE - Mini Mental State Exam  Orientation to time 4 5 5   Orientation to Place 5 5 5   Registration 2 3 3   Attention/ Calculation 5 5 3   Recall 3 3 2   Language- name 2 objects 2 2 2   Language- repeat 1 1 1   Language- follow 3 step command 3 3 3   Language- read & follow direction 1 1 1   Write a sentence 1 1 1   Copy design 0 1 1  Total score 27 30 27      ASSESSMENT/PLAN: 77 year old African-American lady with asymptomatic left subcortical lacunar infarct in August 2020 discovered at the time of brain imaging for vision difficulties which is unrelated and likely from glaucoma and eye problem.  Vascular risk factors of hypertension, hyperlipidemia, diabetes and sleep apnea with CPAP noncompliance.  At 03/2020 visit, new complaints  of worsening balance,  right hand numbness, memory loss, headaches -extensive evaluation unremarkable but continued to experience frontal and left orbital headaches.   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  He also has mild memory and cognitive difficulties due to mild cognitive impairment.    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  We also discussed memory compensation strategies and recommend increase participation in cognitively challenging activities like solving crossword puzzles, playing bridge, word searches and sudoku.  Continue aspirin for stroke prevention and maintain aggressive risk factor modification.  Advised to get up slowly and avoid sudden movements.  I advised the patient not to drive for 6 months after the last seizure-like episode as per [facility].  She will return for follow-up in 4 months or call earlier if necessary.  CC:  Glendale Chard, MD   I spent a prolonged 45 minutes of face-to-face and non-face-to-face time with patient and family discussing seizures and cognitive impairment.  This included previsit chart review, lab review, study review, order entry, electronic health record documentation, patient education and discussion regarding memory complaints and likely underlying contributing factors, continued headaches and imbalance and answered all other questions to patient's satisfaction  Antony Contras, MD [facility] [address] [place], [place] [address]  Phone [phone] Fax [phone] Note: This document was prepared with digital dictation and possible smart phrase technology. Any transcriptional errors that result from this process are  unintentional.

## 2022-05-15 NOTE — Patient Instructions (Addendum)
  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  We also discussed memory compensation strategies and recommend increase participation in cognitively challenging activities like solving crossword puzzles, playing bridge, word searches and sudoku.  Continue aspirin for stroke prevention and maintain aggressive risk factor modification.  Advised to get up slowly and avoid sudden movements.  She will return for follow-up in 4 months or call earlier if necessary.

## 2022-05-15 NOTE — Telephone Encounter (Signed)
medicare/[employer] NPR sent to [facility] [phone] 

## 2022-05-24 NOTE — Progress Notes (Deleted)
HPI  F former smoker followed for bronchitis, hx  Occular sarcoid, bronchitis/ nodules, OSA complicated by GERD, glaucoma, DM2 NPSG 12/25/03- AHI  2.9/ hr, desaturation to 91%, body weight 164 lbs NPSG-10/18/13- Mild OSA, AHI 9.3/ hr, weight 164 lbs  ACE 09/17/15-47-WNL ACE level 08/18/16-30 ACE level 05/14/20- 22 PFT: 01/07/2011-FEV1 1.59/79%, FEV1/FVC 0.73, FEF 25-75% improved to 38% with bronchodilator. TLC 90% DLCO 55% Office Spirometry 06/09/16- moderate restriction of exhaled volume. FVC 1.51/68%, FEV1 1.20/70%, ratio 0.79, FEF 25-75% 1.19/73% Nuclear Stress Test 09/04/16- EF 65%, low risk. ECHO 07/31/16- Gr 1 DD Walk test on room air 12/21/2017-minimal saturation 94%, maximum heart rate 118/minute with no stops walking 3 laps x180 m. Office Spirometry 12/21/17 PFT 04/13/20- Minimal Obstruction and Minimal Restriction, no resp to BD, Moderately severe DLCO defect PFT 11/27/21- Moderately severe restriction, moderate DLCO defect WALK TEST 4/422- 2 laps, min O2 sat 94%, max HR 127.  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 Now on azathiprime by Dr Deanne Coffer -----States she has pain in her back that radiates to the front she states this has been happening for a while  now. Haven't used cpap machine in a while Just resolving another COVID infection.  Coughing a lot productive of yellow over the past month.  We discussed antibiotics and sputum cultures for this. She has a follow-up with Dr. Isaiah Serge for ILD in 6 months. We reviewed chest CT 2 new nodules that look inflammatory..  We will treat now for common bacterial bronchitis and follow recommendation to repeat CT in 3 months to recheck the nodules. Up between 3 and 5 times at night because of bladder problems for which she has chosen no surgery.  She will have to follow-up with urology/GYN on this. This amount of disturbance is significantly interfering with CPAP use. Continues with back and chest pains which seem musculoskeletal/nerve root related.  She has seen Dr. Elease Hashimoto for cardiology. HRCT chest 02/18/22- IMPRESSION: 1. Clustered solid pulmonary nodules of the right upper lobe with associated bronchiectasis and two new solid pulmonary nodules measuring up to 6 mm, likely due to worsening infectious process. Follow-up chest CT is recommended in 3 months to ensure resolution. 2. 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 No evidence of progression when compared with most recent prior exam. 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 Am Rosezetta Schlatter Crit Care Med Vol 198, Iss 5, [phone], Oct 11 2016. 3. Severe left main and three-vessel coronary artery calcifications. 4. Aortic Atherosclerosis (ICD10-I70.0) and Emphysema (ICD10-J43.9).  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        // Needs f/uCT for nodules//  CXR 05/07/22- MPRESSION: No active disease.  ROS-see HPI     + = positive Constitutional:   No-   weight loss, night sweats, fevers, chills, fatigue, lassitude. HEENT:   +  headaches, difficulty swallowing, tooth/dental problems, sore throat,       Some  sneezing, itching, ear ache, +nasal congestion, post nasal drip,  CV: + chest pain, no-orthopnea, PND, swelling in lower extremities, anasarca, dizziness, palpitations Resp: +  shortness of breath with exertion not at rest.            +productive cough,  + non-productive cough,  No- coughing up of blood.              + color of mucus.   wheezing.   Skin: No-   rash or lesions. GI:  +  heartburn, indigestion, abdominal pain, nausea, vomiting,  GU:  MS:  +  joint pain or swelling, + back pain Neuro-     nothing unusual Psych:  No- change in mood or affect. No depression or anxiety.  No memory loss.  OBJ  Afebrile General- Alert, Oriented, Affect tearful, Distress- none acute,  Skin- rash-none, lesions- none, excoriation- none Lymphadenopathy- none Head- atraumatic            Eyes- Gross vision intact, PERRLA, conjunctivae clear secretions- not injected            Ears- Hearing aid             Nose-  turbinate edema, no-Septal dev, mucus, polyps, erosion, perforation             Throat- Mallampati III , mucosa  , drainage- none, tonsils- atrophic Neck- flexible , trachea midline, no stridor , thyroid nl, carotid no bruit Chest - symmetrical excursion , unlabored           Heart/CV- RRR , no murmur , no gallop  , no rub, nl s1 s2                            - JVD- none , edema- none, stasis changes- none, varices- none           Lung-  Clear/ trace crackles, Cough, wheeze-none, rhonchi-none , dullness-none, rub- none           Chest wall-  Abd- No HSM Br/ Gen/ Rectal- Not done, not indicated Extrem- cyanosis- none, clubbing, none, atrophy- none, strength- nl. +elastic hose Neuro- grossly intact to observation

## 2022-05-27 ENCOUNTER — Ambulatory Visit: Payer: Medicare Other | Admitting: Internal Medicine

## 2022-05-27 DIAGNOSIS — D8686 Sarcoid arthropathy: Secondary | ICD-10-CM | POA: Diagnosis not present

## 2022-05-27 DIAGNOSIS — M7989 Other specified soft tissue disorders: Secondary | ICD-10-CM | POA: Diagnosis not present

## 2022-05-27 DIAGNOSIS — M2550 Pain in unspecified joint: Secondary | ICD-10-CM | POA: Diagnosis not present

## 2022-05-27 DIAGNOSIS — M1990 Unspecified osteoarthritis, unspecified site: Secondary | ICD-10-CM | POA: Diagnosis not present

## 2022-05-27 DIAGNOSIS — M0609 Rheumatoid arthritis without rheumatoid factor, multiple sites: Secondary | ICD-10-CM | POA: Diagnosis not present

## 2022-05-27 DIAGNOSIS — Z79899 Other long term (current) drug therapy: Secondary | ICD-10-CM | POA: Diagnosis not present

## 2022-05-27 DIAGNOSIS — I890 Lymphedema, not elsewhere classified: Secondary | ICD-10-CM | POA: Diagnosis not present

## 2022-05-27 DIAGNOSIS — R768 Other specified abnormal immunological findings in serum: Secondary | ICD-10-CM | POA: Diagnosis not present

## 2022-05-27 DIAGNOSIS — D869 Sarcoidosis, unspecified: Secondary | ICD-10-CM | POA: Diagnosis not present

## 2022-05-27 DIAGNOSIS — J8410 Pulmonary fibrosis, unspecified: Secondary | ICD-10-CM | POA: Diagnosis not present

## 2022-05-29 DIAGNOSIS — H35373 Puckering of macula, bilateral: Secondary | ICD-10-CM | POA: Diagnosis not present

## 2022-05-29 DIAGNOSIS — H3581 Retinal edema: Secondary | ICD-10-CM | POA: Diagnosis not present

## 2022-05-29 DIAGNOSIS — H43813 Vitreous degeneration, bilateral: Secondary | ICD-10-CM | POA: Diagnosis not present

## 2022-05-29 DIAGNOSIS — H2013 Chronic iridocyclitis, bilateral: Secondary | ICD-10-CM | POA: Diagnosis not present

## 2022-05-29 DIAGNOSIS — H15012 Anterior scleritis, left eye: Secondary | ICD-10-CM | POA: Diagnosis not present

## 2022-05-29 LAB — HM DIABETES EYE EXAM

## 2022-06-02 ENCOUNTER — Ambulatory Visit (INDEPENDENT_AMBULATORY_CARE_PROVIDER_SITE_OTHER): Payer: Medicare Other | Admitting: Neurology

## 2022-06-02 DIAGNOSIS — R569 Unspecified convulsions: Secondary | ICD-10-CM

## 2022-06-05 ENCOUNTER — Encounter: Payer: Self-pay | Admitting: Internal Medicine

## 2022-06-05 ENCOUNTER — Ambulatory Visit (INDEPENDENT_AMBULATORY_CARE_PROVIDER_SITE_OTHER): Payer: Medicare Other | Admitting: Internal Medicine

## 2022-06-05 VITALS — Temp 98.5°F | Ht 65.0 in | Wt 161.6 lb

## 2022-06-05 DIAGNOSIS — R2689 Other abnormalities of gait and mobility: Secondary | ICD-10-CM | POA: Diagnosis not present

## 2022-06-05 DIAGNOSIS — Z91018 Allergy to other foods: Secondary | ICD-10-CM

## 2022-06-05 DIAGNOSIS — I110 Hypertensive heart disease with heart failure: Secondary | ICD-10-CM

## 2022-06-05 DIAGNOSIS — E785 Hyperlipidemia, unspecified: Secondary | ICD-10-CM

## 2022-06-05 DIAGNOSIS — E1169 Type 2 diabetes mellitus with other specified complication: Secondary | ICD-10-CM

## 2022-06-05 DIAGNOSIS — I5032 Chronic diastolic (congestive) heart failure: Secondary | ICD-10-CM

## 2022-06-05 MED ORDER — CYANOCOBALAMIN 1000 MCG/ML IJ SOLN
1000.0000 ug | Freq: Once | INTRAMUSCULAR | Status: AC
  Administered 2022-06-05: 1000 ug via INTRAMUSCULAR

## 2022-06-05 MED ORDER — EPINEPHRINE 0.3 MG/0.3ML IJ SOAJ: 0.3000 mg | INTRAMUSCULAR | 0 refills | Status: DC | PRN

## 2022-06-05 NOTE — Patient Instructions (Signed)

## 2022-06-05 NOTE — Progress Notes (Signed)
Sheila Reynolds,acting as a Neurosurgeon for Sheila Aliment, MD.,have documented all relevant documentation on the behalf of Sheila Aliment, MD,as directed by  Sheila Aliment, MD while in the presence of Sheila Aliment, MD.    Subjective:     Patient ID: Sheila Reynolds , female    DOB: [birth year] , 77 y.o.   MRN: [id number]   Chief Complaint  Patient presents with   Diabetes   Hypertension    HPI  Patient presents today for DM/BP check.  She is accompanied by her daughter today.  She reports compliance with meds. She has not had any issues with her medication.  Patient reports feeling very tired, weak, and little dizzy. Patients blood sugar was 111.    Diabetes She presents for her follow-up diabetic visit. She has type 2 diabetes mellitus. Her disease course has been stable. There are no hypoglycemic associated symptoms. Pertinent negatives for hypoglycemia include no dizziness or headaches. Associated symptoms include fatigue. Pertinent negatives for diabetes include no blurred vision, no chest pain, no polydipsia, no polyphagia, no polyuria and no weakness. There are no hypoglycemic complications. Diabetic complications include heart disease. Her weight is stable. Her breakfast blood glucose is taken between 7-8 am. Her breakfast blood glucose range is generally 90-110 mg/dl. An ACE inhibitor/angiotensin II receptor blocker is being taken. Eye exam is current.  Hypertension This is a chronic problem. The current episode started more than 1 year ago. The problem has been gradually improving since onset. The problem is controlled. Pertinent negatives include no blurred vision, chest pain, headaches, orthopnea, palpitations or shortness of breath. Risk factors for coronary artery disease include diabetes mellitus, dyslipidemia, post-menopausal state and sedentary lifestyle. The current treatment provides moderate improvement. Compliance problems include exercise.   No data found.      Past Medical History:  Diagnosis Date   Anemia    3 months ago anemic   Anxiety    on meds   Arthritis    "all over" (01/15/2017)   Asthma    Bronchitis with emphysema    Chest pain    Chronic bronchitis (HCC)    Coronary artery disease    a. 01/2017 she underwent orbital atherectomy/DES to the proxmal LAD and PTCA to ostial D2. 2D Echo 01/15/17 showed mild LVH, EF 60-65%, grade 1 DD.   Family history of anesthesia complication    daughter N/V   Fibromyalgia    GERD (gastroesophageal reflux disease)    on meds   Glaucoma    Heart murmur    History of hiatal hernia    Hx of echocardiogram    Echo (03/2013):  Tech limited; Mild focal basal septal hypertrophy, EF 60-65%, normal RVF   Hyperlipidemia    Hypertension    Nonsustained ventricular tachycardia (HCC)    OSA on CPAP    Premature atrial contractions    PVC (premature ventricular contraction)    a. Holter 12/16: NSR, occ PAC,PVCs   Sarcoidosis    Type II diabetes mellitus (HCC)      Family History  Problem Relation Age of Onset   Heart disease Father    Diabetes Father    Glaucoma Father    Cancer Mother        unknown type, ?lung   Heart disease Paternal Grandmother    Cancer Paternal Grandmother        unknown type   Diabetes Brother    Glaucoma Brother      Current Outpatient  Medications:    acetaminophen (TYLENOL) 500 MG tablet, Take 1,000 mg by mouth 2 (two) times daily as needed for moderate pain or headache., Disp: , Rfl:    albuterol (PROVENTIL) (2.5 MG/3ML) 0.083% nebulizer solution, Take 3 mLs (2.5 mg total) by nebulization daily as needed for wheezing or shortness of breath., Disp: 75 mL, Rfl: 12   albuterol (VENTOLIN HFA) 108 (90 Base) MCG/ACT inhaler, Inhale 2 puffs into the lungs every 6 (six) hours as needed for wheezing or shortness of breath., Disp: 18 g, Rfl: 12   ANORO ELLIPTA 62.5-25 MCG/ACT AEPB, INHALE 1 PUFF INTO THE LUNGS DAILY AT 6 AM, Disp: 60 each, Rfl: 3   aspirin EC 81 MG tablet,  Take 81 mg by mouth daily. Swallow whole., Disp: , Rfl:    atorvastatin (LIPITOR) 80 MG tablet, Take 1 tablet (80 mg total) by mouth daily., Disp: 90 tablet, Rfl: 3   azaTHIOprine (IMURAN) 50 MG tablet, Take by mouth., Disp: , Rfl:    benzonatate (TESSALON) 100 MG capsule, Take 1 capsule by mouth every 8 (eight) hours for cough., Disp: 21 capsule, Rfl: 0   Blood Glucose Monitoring Suppl (ACCU-CHEK AVIVA PLUS) w/Device KIT, Use to check blood sugars 3 times a day. Dx code e11.65, Disp: 1 kit, Rfl: 3   brimonidine (ALPHAGAN P) 0.1 % SOLN, Place 1 drop into both eyes 2 (two) times daily. , Disp: , Rfl:    brinzolamide (AZOPT) 1 % ophthalmic suspension, Place 1 drop into both eyes 3 (three) times daily., Disp: , Rfl:    carvedilol (COREG) 25 MG tablet, Take 1 tablet (25 mg total) by mouth 2 (two) times daily., Disp: 180 tablet, Rfl: 3   cetirizine (ZYRTEC ALLERGY) 10 MG tablet, Take 1 tablet (10 mg total) by mouth daily., Disp: 30 tablet, Rfl: 2   clopidogrel (PLAVIX) 75 MG tablet, TAKE 1 TABLET(75 MG) BY MOUTH DAILY, Disp: 90 tablet, Rfl: 3   ezetimibe (ZETIA) 10 MG tablet, Take 1 tablet (10 mg total) by mouth daily., Disp: 90 tablet, Rfl: 3   furosemide (LASIX) 40 MG tablet, Take 1 tablet (40 mg total) by mouth daily as needed., Disp: 90 tablet, Rfl: 1   isosorbide mononitrate (IMDUR) 60 MG 24 hr tablet, TAKE 1 TABLET(60 MG) BY MOUTH DAILY, Disp: 90 tablet, Rfl: 3   JANUMET 50-500 MG tablet, TAKE 1 TABLET BY MOUTH TWICE DAILY WITH A MEAL, Disp: 90 tablet, Rfl: 1   ketorolac (ACULAR) 0.5 % ophthalmic solution, Place 1 drop into the right eye 3 (three) times daily., Disp: , Rfl:    Lancets (ONETOUCH DELICA PLUS LANCET33G) MISC, CHECK BLOOD SUGAR BEFORE BREAKFAST AND DINNER, Disp: 100 each, Rfl: 1   levETIRAcetam (KEPPRA XR) 500 MG 24 hr tablet, Take 1 tablet (500 mg total) by mouth daily., Disp: 30 tablet, Rfl: 5   montelukast (SINGULAIR) 10 MG tablet, TAKE 1 TABLET BY MOUTH EVERY DAY, Disp: 90 tablet,  Rfl: 1   Nebulizers (COMPRESSOR/NEBULIZER) MISC, Use as directed, Disp: 1 each, Rfl: 0   nitroGLYCERIN (NITROSTAT) 0.4 MG SL tablet, Place 1 tablet (0.4 mg total) under the tongue every 5 (five) minutes as needed for chest pain., Disp: 25 tablet, Rfl: prn   pantoprazole (PROTONIX) 40 MG tablet, TAKE 1 TABLET(40 MG) BY MOUTH DAILY, Disp: 90 tablet, Rfl: 3   Polyvinyl Alcohol-Povidone PF (REFRESH) 1.4-0.6 % SOLN, Place 1-2 drops into both eyes 3 (three) times daily as needed (dry/irritated eyes.)., Disp: , Rfl:    potassium chloride SA (  KLOR-CON) 20 MEQ tablet, Take 1 tablet (20 mEq total) by mouth every other day., Disp: 90 tablet, Rfl: 1   prednisoLONE acetate (PRED FORTE) 1 % ophthalmic suspension, Apply to eye., Disp: , Rfl:    pregabalin (LYRICA) 75 MG capsule, TAKE 1 CAPSULE(75 MG) BY MOUTH TWICE DAILY, Disp: 180 capsule, Rfl: 2   RHOPRESSA 0.02 % SOLN, Place 1 drop into the left eye at bedtime., Disp: , Rfl:    tiZANidine (ZANAFLEX) 4 MG tablet, TAKE 1 TABLET(4 MG) BY MOUTH DAILY, Disp: 30 tablet, Rfl: 1   traMADol (ULTRAM) 50 MG tablet, Take by mouth., Disp: , Rfl:    Travoprost, BAK Free, (TRAVATAN) 0.004 % SOLN ophthalmic solution, Place 1 drop into the right eye at bedtime., Disp: , Rfl:    EPINEPHrine 0.3 mg/0.3 mL IJ SOAJ injection, Inject 0.3 mg into the muscle as needed for anaphylaxis., Disp: 1 each, Rfl: 0   Allergies  Allergen Reactions   Crestor [Rosuvastatin Calcium] Other (See Comments)    muscle aches   Demerol  [Meperidine Hcl]     Other reaction(s): Hallucinations   Shellfish Allergy Itching and Other (See Comments)    Crab, shrimp and lobster ---lips itch and tingle Was told not to eat again after having a allergy test. Lobster, crab and shrimp      Review of Systems  Constitutional:  Positive for fatigue.  Eyes:  Negative for blurred vision.  Respiratory: Negative.  Negative for shortness of breath.   Cardiovascular: Negative.  Negative for chest pain,  palpitations and orthopnea.  Gastrointestinal: Negative.   Endocrine: Negative for polydipsia, polyphagia and polyuria.  Neurological: Negative.  Negative for dizziness, weakness and headaches.  Psychiatric/Behavioral: Negative.       Today's Vitals   06/05/22 1608  Temp: 98.5 F (36.9 C)  TempSrc: Oral  Weight: 161 lb 9.6 oz (73.3 kg)  Height: 5\' 5"  (1.651 m)  PainSc: 0-No pain   Body mass index is 26.89 kg/m.  Wt Readings from Last 3 Encounters:  06/05/22 161 lb 9.6 oz (73.3 kg)  05/15/22 167 lb (75.8 kg)  05/07/22 163 lb 2.3 oz (74 kg)   BP Readings from Last 3 Encounters:  05/15/22 (!) 160/83  05/07/22 138/82  04/30/22 122/82    Objective:  Physical Exam Vitals and nursing note reviewed.  Constitutional:      Appearance: Normal appearance.  HENT:     Head: Normocephalic and atraumatic.  Eyes:     Extraocular Movements: Extraocular movements intact.  Cardiovascular:     Rate and Rhythm: Normal rate and regular rhythm.     Heart sounds: Normal heart sounds.  Pulmonary:     Effort: Pulmonary effort is normal.     Breath sounds: Normal breath sounds.  Musculoskeletal:     Cervical back: Normal range of motion.  Skin:    General: Skin is warm.  Neurological:     General: No focal deficit present.     Mental Status: She is alert.  Psychiatric:        Mood and Affect: Mood normal.        Behavior: Behavior normal.         Assessment And Plan:     1. Dyslipidemia associated with type 2 diabetes mellitus (HCC) Comments: Chronic, LDL goal < 70.  She will c/w atorvastatin and Janumet. - Hemoglobin A1c - Microalbumin / Creatinine Urine Ratio  2. 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 heart failure (HCC) Comments: Chronic, no med  changes. She is reminded to follow a low sodium diet. - BMP8+eGFR  3. Multiple food allergies Comments: I will send rx Epi-pen to her local pharmacy. - EPINEPHrine 0.3 mg/0.3 mL IJ SOAJ injection; Inject  0.3 mg into the muscle as needed for anaphylaxis.  Dispense: 1 each; Refill: 0  4. Imbalance - Vitamin B12 - cyanocobalamin (VITAMIN B12) injection 1,000 mcg     Patient was given opportunity to ask questions. Patient verbalized understanding of the plan and was able to repeat key elements of the plan. All questions were answered to their satisfaction.   I, Sheila Aliment, MD, have reviewed all documentation for this visit. The documentation on 06/05/22 for the exam, diagnosis, procedures, and orders are all accurate and complete.   IF YOU HAVE BEEN REFERRED TO A SPECIALIST, IT MAY TAKE 1-2 WEEKS TO SCHEDULE/PROCESS THE REFERRAL. IF YOU HAVE NOT HEARD FROM US/SPECIALIST IN TWO WEEKS, PLEASE GIVE [place] A CALL AT [phone].   THE PATIENT IS ENCOURAGED TO PRACTICE SOCIAL DISTANCING DUE TO THE COVID-19 PANDEMIC.

## 2022-06-06 LAB — HEMOGLOBIN A1C
Est. average glucose Bld gHb Est-mCnc: 137 mg/dL
Hgb A1c MFr Bld: 6.4 % — ABNORMAL HIGH (ref 4.8–5.6)

## 2022-06-06 LAB — BMP8+EGFR
BUN/Creatinine Ratio: 18 (ref 12–28)
BUN: 22 mg/dL (ref 8–27)
CO2: 25 mmol/L (ref 20–29)
Calcium: 9.8 mg/dL (ref 8.7–10.3)
Chloride: 100 mmol/L (ref 96–106)
Creatinine, Ser: 1.23 mg/dL — ABNORMAL HIGH (ref 0.57–1.00)
Glucose: 112 mg/dL — ABNORMAL HIGH (ref 70–99)
Potassium: 4.5 mmol/L (ref 3.5–5.2)
Sodium: 139 mmol/L (ref 134–144)
eGFR: 46 mL/min/{1.73_m2} — ABNORMAL LOW (ref >59)

## 2022-06-06 LAB — MICROALBUMIN / CREATININE URINE RATIO
Creatinine, Urine: 260 mg/dL
Microalb/Creat Ratio: 5 mg/g creat (ref 0–29)
Microalbumin, Urine: 12.8 ug/mL

## 2022-06-06 LAB — VITAMIN B12: Vitamin B-12: 487 pg/mL (ref 232–1245)

## 2022-06-10 DIAGNOSIS — Z1231 Encounter for screening mammogram for malignant neoplasm of breast: Secondary | ICD-10-CM | POA: Diagnosis not present

## 2022-06-10 LAB — HM MAMMOGRAPHY

## 2022-06-12 ENCOUNTER — Telehealth: Payer: Self-pay | Admitting: Neurology

## 2022-06-12 DIAGNOSIS — H15012 Anterior scleritis, left eye: Secondary | ICD-10-CM | POA: Diagnosis not present

## 2022-06-12 DIAGNOSIS — H2013 Chronic iridocyclitis, bilateral: Secondary | ICD-10-CM | POA: Diagnosis not present

## 2022-06-12 NOTE — Telephone Encounter (Signed)
Pt called wanting to make sure that something is called in for her to keep her calm during her MRI that is scheduled on 5/6. She would like for it to be called in to the [facility] on [address] and [facility] She also stated that she is waiting on the results of a Brain test she had as well. Please advise.

## 2022-06-13 NOTE — Progress Notes (Signed)
Kindly inform the patient that EEG or brainwave study was normal.  No evidence of seizure activity.

## 2022-06-15 ENCOUNTER — Other Ambulatory Visit: Payer: Self-pay | Admitting: Neurology

## 2022-06-15 MED ORDER — ALPRAZOLAM 0.25 MG PO TABS: 0.2500 mg | ORAL_TABLET | ORAL | 0 refills | Status: DC

## 2022-06-16 ENCOUNTER — Ambulatory Visit
Admission: RE | Admit: 2022-06-16 | Discharge: 2022-06-16 | Disposition: A | Discharge status: Home / Self Care | Payer: Medicare Other | Source: Ambulatory Visit | Attending: Neurology | Admitting: Neurology

## 2022-06-16 DIAGNOSIS — R413 Other amnesia: Secondary | ICD-10-CM | POA: Diagnosis not present

## 2022-06-16 DIAGNOSIS — R569 Unspecified convulsions: Secondary | ICD-10-CM | POA: Diagnosis not present

## 2022-06-16 MED ORDER — GADOPICLENOL 0.5 MMOL/ML IV SOLN
7.5000 mL | Freq: Once | INTRAVENOUS | Status: AC | PRN
  Administered 2022-06-16: 7.5 mL via INTRAVENOUS

## 2022-06-23 ENCOUNTER — Encounter: Payer: Self-pay | Admitting: Internal Medicine

## 2022-06-27 NOTE — Progress Notes (Signed)
Kindly inform the patient that MRI scan of the brain shows stable appearance of the small strokes in the deep portion of the brain and age-related changes of mild shrinkage of the brain and hardening of the arteries.  No significant change compared to previous MRI from 2 years ago.

## 2022-06-30 ENCOUNTER — Telehealth: Payer: Self-pay | Admitting: Neurology

## 2022-06-30 NOTE — Telephone Encounter (Signed)
Discussed results with pt during TE

## 2022-06-30 NOTE — Telephone Encounter (Addendum)
Contacted pt back, she stated Saturday she was with her grand kids and all of a sudden she fell backwards. She didn't hit her head. She didn't have the strength to get up alone, her grand child was able to get her phone to call someone to help her get up. She and the kids were out that day running errands and to eat, didn't miss any medications. She stated she had a headache but denied having any other symptoms. Went over MRI, I advised her to keep a check on her BP and glucose as she hasn't checked either over the weekend. Informed to get up slowly and avoid sudden movements. Neither headache or poor balance are a new issue. She has a upcoming appt with cardio next week. EEG and MRI brain were relatively stable. As work in, do you recommend anything else until appt with cardio ?

## 2022-06-30 NOTE — Telephone Encounter (Signed)
Pt called and stated that she is wanting to discuss with the RN a fall that she had on Sat. She does not know what caused it and was on the floor for until someone finally came to help her up. Please advise.

## 2022-07-01 NOTE — Telephone Encounter (Signed)
Noted  

## 2022-07-04 ENCOUNTER — Encounter: Payer: Self-pay | Admitting: Internal Medicine

## 2022-07-08 ENCOUNTER — Encounter: Payer: Self-pay | Admitting: Nurse Practitioner

## 2022-07-08 ENCOUNTER — Encounter: Payer: Self-pay | Admitting: Cardiovascular Disease

## 2022-07-08 ENCOUNTER — Ambulatory Visit (INDEPENDENT_AMBULATORY_CARE_PROVIDER_SITE_OTHER): Payer: Medicare Other | Admitting: Nurse Practitioner

## 2022-07-08 VITALS — BP 130/60 | HR 98 | Temp 98.5°F | Ht 65.0 in | Wt 162.4 lb

## 2022-07-08 DIAGNOSIS — J069 Acute upper respiratory infection, unspecified: Secondary | ICD-10-CM | POA: Diagnosis not present

## 2022-07-08 MED ORDER — AMOXICILLIN-POT CLAVULANATE 875-125 MG PO TABS: 1.0000 | ORAL_TABLET | Freq: Two times a day (BID) | ORAL | 0 refills | Status: DC

## 2022-07-08 MED ORDER — PROMETHAZINE-DM 6.25-15 MG/5ML PO SYRP: 5.0000 mL | ORAL_SOLUTION | Freq: Four times a day (QID) | ORAL | 0 refills | Status: DC | PRN

## 2022-07-08 NOTE — Progress Notes (Signed)
[name],acting as a Neurosurgeon for Arnette Felts, FNP.,have documented all relevant documentation on the behalf of Arnette Felts, FNP,as directed by  Arnette Felts, FNP while in the presence of Arnette Felts, FNP.    Subjective:     Patient ID: Sheila Reynolds , female    DOB: Nov 11, 1945 , 77 y.o.   MRN: [id number]   Chief Complaint  Patient presents with   URI    HPI  Patient presents today for cough and congestion starting on Saturday. She has a productive cough yellow green with some blood especially in the morning. She had fever intially and was taking tylenol. Her taste is a little different but can smell. Singular, cough medicine with honey, robitussin and tylenol.   Patient reports everyone is sick around her, patient reports her grand daughter was diagnosed with severe bronchitis.   Patient reports she had a fall on Friday where she fell backward off her porch, she also reports falling down her steps on Tuesday earlier in the week. When she fell on Tuesday she does not remember what happened. She had her 2 month and 17 year old grandchildren and she could not get up. She tried to call others and was unable to see the numbers on the phone due to blurred vision. Initially she was confused then had clarity. She was on the floor for about 30 minutes. She did call her neurologist and they felt was blood pressure or blood sugar. She did not have the feeling when her blood sugar is usually up.   Her second fall she hit the storm door. It took her a while to get up to the chair. Patient has a large bruise on the back of her left arm she reports when she fell on Friday she hit her head on the storm door.   BP Readings from Last 3 Encounters: 07/08/22 : 130/60 05/15/22 : (!) 160/83 05/07/22 : 138/82       Past Medical History:  Diagnosis Date   Anemia    3 months ago anemic   Anxiety    on meds   Arthritis    "all over" (01/15/2017)   Asthma    Bronchitis with emphysema     Chest pain    Chronic bronchitis (HCC)    Coronary artery disease    a. 01/2017 she underwent orbital atherectomy/DES to the proxmal LAD and PTCA to ostial D2. 2D Echo 01/15/17 showed mild LVH, EF 60-65%, grade 1 DD.   Family history of anesthesia complication    daughter N/V   Fibromyalgia    GERD (gastroesophageal reflux disease)    on meds   Glaucoma    Heart murmur    History of hiatal hernia    Hx of echocardiogram    Echo (03/2013):  Tech limited; Mild focal basal septal hypertrophy, EF 60-65%, normal RVF   Hyperlipidemia    Hypertension    Nonsustained ventricular tachycardia (HCC)    OSA on CPAP    Premature atrial contractions    PVC (premature ventricular contraction)    a. Holter 12/16: NSR, occ PAC,PVCs   Sarcoidosis    Type II diabetes mellitus (HCC)      Family History  Problem Relation Age of Onset   Heart disease Father    Diabetes Father    Glaucoma Father    Cancer Mother        unknown type, ?lung   Heart disease Paternal Grandmother    Cancer Paternal Grandmother  unknown type   Diabetes Brother    Glaucoma Brother      Current Outpatient Medications:    acetaminophen (TYLENOL) 500 MG tablet, Take 1,000 mg by mouth 2 (two) times daily as needed for moderate pain or headache., Disp: , Rfl:    albuterol (PROVENTIL) (2.5 MG/3ML) 0.083% nebulizer solution, Take 3 mLs (2.5 mg total) by nebulization daily as needed for wheezing or shortness of breath., Disp: 75 mL, Rfl: 12   albuterol (VENTOLIN HFA) 108 (90 Base) MCG/ACT inhaler, Inhale 2 puffs into the lungs every 6 (six) hours as needed for wheezing or shortness of breath., Disp: 18 g, Rfl: 12   ALPRAZolam (XANAX) 0.25 MG tablet, Take 1 tablet (0.25 mg total) by mouth as directed. (Patient not taking: Reported on 07/10/2022), Disp: 2 tablet, Rfl: 0   amoxicillin-clavulanate (AUGMENTIN) 875-125 MG tablet, Take 1 tablet by mouth 2 (two) times daily., Disp: 14 tablet, Rfl: 0   ANORO ELLIPTA 62.5-25  MCG/ACT AEPB, INHALE 1 PUFF INTO THE LUNGS DAILY AT 6 AM, Disp: 60 each, Rfl: 3   aspirin EC 81 MG tablet, Take 81 mg by mouth daily. Swallow whole., Disp: , Rfl:    atorvastatin (LIPITOR) 80 MG tablet, Take 1 tablet (80 mg total) by mouth daily., Disp: 90 tablet, Rfl: 3   azaTHIOprine (IMURAN) 50 MG tablet, Take by mouth., Disp: , Rfl:    Blood Glucose Monitoring Suppl (ACCU-CHEK AVIVA PLUS) w/Device KIT, Use to check blood sugars 3 times a day. Dx code e11.65, Disp: 1 kit, Rfl: 3   brimonidine (ALPHAGAN P) 0.1 % SOLN, Place 1 drop into both eyes 2 (two) times daily. , Disp: , Rfl:    brinzolamide (AZOPT) 1 % ophthalmic suspension, Place 1 drop into both eyes 3 (three) times daily., Disp: , Rfl:    carvedilol (COREG) 25 MG tablet, Take 1 tablet (25 mg total) by mouth 2 (two) times daily., Disp: 180 tablet, Rfl: 3   cetirizine (ZYRTEC ALLERGY) 10 MG tablet, Take 1 tablet (10 mg total) by mouth daily., Disp: 30 tablet, Rfl: 2   clopidogrel (PLAVIX) 75 MG tablet, TAKE 1 TABLET(75 MG) BY MOUTH DAILY, Disp: 90 tablet, Rfl: 3   EPINEPHrine 0.3 mg/0.3 mL IJ SOAJ injection, Inject 0.3 mg into the muscle as needed for anaphylaxis., Disp: 1 each, Rfl: 0   ezetimibe (ZETIA) 10 MG tablet, Take 1 tablet (10 mg total) by mouth daily., Disp: 90 tablet, Rfl: 3   furosemide (LASIX) 40 MG tablet, Take 1 tablet (40 mg total) by mouth daily as needed., Disp: 90 tablet, Rfl: 1   isosorbide mononitrate (IMDUR) 60 MG 24 hr tablet, TAKE 1 TABLET(60 MG) BY MOUTH DAILY, Disp: 90 tablet, Rfl: 3   JANUMET 50-500 MG tablet, TAKE 1 TABLET BY MOUTH TWICE DAILY WITH A MEAL, Disp: 90 tablet, Rfl: 1   ketorolac (ACULAR) 0.5 % ophthalmic solution, Place 1 drop into the right eye 3 (three) times daily., Disp: , Rfl:    Lancets (ONETOUCH DELICA PLUS LANCET33G) MISC, CHECK BLOOD SUGAR BEFORE BREAKFAST AND DINNER, Disp: 100 each, Rfl: 1   levETIRAcetam (KEPPRA XR) 500 MG 24 hr tablet, Take 1 tablet (500 mg total) by mouth daily., Disp:  30 tablet, Rfl: 5   montelukast (SINGULAIR) 10 MG tablet, TAKE 1 TABLET BY MOUTH EVERY DAY, Disp: 90 tablet, Rfl: 1   Nebulizers (COMPRESSOR/NEBULIZER) MISC, Use as directed, Disp: 1 each, Rfl: 0   nitroGLYCERIN (NITROSTAT) 0.4 MG SL tablet, Place 1 tablet (0.4 mg total)  under the tongue every 5 (five) minutes as needed for chest pain., Disp: 25 tablet, Rfl: prn   pantoprazole (PROTONIX) 40 MG tablet, TAKE 1 TABLET(40 MG) BY MOUTH DAILY, Disp: 90 tablet, Rfl: 3   Polyvinyl Alcohol-Povidone PF (REFRESH) 1.4-0.6 % SOLN, Place 1-2 drops into both eyes 3 (three) times daily as needed (dry/irritated eyes.)., Disp: , Rfl:    potassium chloride SA (KLOR-CON) 20 MEQ tablet, Take 1 tablet (20 mEq total) by mouth every other day., Disp: 90 tablet, Rfl: 1   prednisoLONE acetate (PRED FORTE) 1 % ophthalmic suspension, Apply to eye., Disp: , Rfl:    pregabalin (LYRICA) 75 MG capsule, TAKE 1 CAPSULE(75 MG) BY MOUTH TWICE DAILY, Disp: 180 capsule, Rfl: 2   promethazine-dextromethorphan (PROMETHAZINE-DM) 6.25-15 MG/5ML syrup, Take 5 mLs by mouth 4 (four) times daily as needed for cough., Disp: 118 mL, Rfl: 0   RHOPRESSA 0.02 % SOLN, Place 1 drop into the left eye at bedtime., Disp: , Rfl:    tiZANidine (ZANAFLEX) 4 MG tablet, TAKE 1 TABLET(4 MG) BY MOUTH DAILY, Disp: 30 tablet, Rfl: 1   traMADol (ULTRAM) 50 MG tablet, Take by mouth., Disp: , Rfl:    Travoprost, BAK Free, (TRAVATAN) 0.004 % SOLN ophthalmic solution, Place 1 drop into the right eye at bedtime., Disp: , Rfl:    Allergies  Allergen Reactions   Crestor [Rosuvastatin Calcium] Other (See Comments)    muscle aches   Demerol  [Meperidine Hcl]     Other reaction(s): Hallucinations   Shellfish Allergy Itching and Other (See Comments)    Crab, shrimp and lobster ---lips itch and tingle Was told not to eat again after having a allergy test. Lobster, crab and shrimp      Review of Systems  Constitutional: Negative.   Respiratory:  Positive for cough.  Negative for chest tightness and wheezing.      Today's Vitals   07/08/22 1456  BP: 130/60  Pulse: 98  Temp: 98.5 F (36.9 C)  TempSrc: Oral  SpO2: 100%  Weight: 162 lb 6.4 oz (73.7 kg)  Height: 5\' 5"  (1.651 m)  PainSc: 0-No pain   Body mass index is 27.02 kg/m.  Wt Readings from Last 3 Encounters:  07/10/22 163 lb 3.2 oz (74 kg)  07/08/22 162 lb 6.4 oz (73.7 kg)  06/05/22 161 lb 9.6 oz (73.3 kg)    The ASCVD Risk score (Arnett DK, et al., 2019) failed to calculate for the following reasons:   The patient has a prior MI or stroke diagnosis  Objective:  Physical Exam Vitals reviewed.  Constitutional:      General: She is not in acute distress.    Appearance: Normal appearance. She is well-developed.  Cardiovascular:     Rate and Rhythm: Normal rate and regular rhythm.     Pulses: Normal pulses.     Heart sounds: Normal heart sounds. No murmur heard. Pulmonary:     Effort: Pulmonary effort is normal. No respiratory distress.     Breath sounds: Normal breath sounds. No wheezing.     Comments: Diminished breath sounds bilateral bases. Chest:     Chest wall: No tenderness.  Musculoskeletal:        General: Normal range of motion.  Skin:    General: Skin is warm and dry.     Capillary Refill: Capillary refill takes less than 2 seconds.  Neurological:     General: No focal deficit present.     Mental Status: She is alert and oriented  to person, place, and time.     Cranial Nerves: No cranial nerve deficit.     Motor: No weakness.  Psychiatric:        Mood and Affect: Mood normal.        Behavior: Behavior normal.        Thought Content: Thought content normal.        Judgment: Judgment normal.         Assessment And Plan:     1. Upper respiratory tract infection, unspecified type Comments: Negative rapid flu and covid, has productive cough, will treat with augment and cough syrup. Advised to make sure she has family present when taking - POC SOFIA 2 FLU +  SARS ANTIGEN FIA - amoxicillin-clavulanate (AUGMENTIN) 875-125 MG tablet; Take 1 tablet by mouth 2 (two) times daily.  Dispense: 14 tablet; Refill: 0 - promethazine-dextromethorphan (PROMETHAZINE-DM) 6.25-15 MG/5ML syrup; Take 5 mLs by mouth 4 (four) times daily as needed for cough.  Dispense: 118 mL; Refill: 0    No follow-ups on file.   Patient was given opportunity to ask questions. Patient verbalized understanding of the plan and was able to repeat key elements of the plan. All questions were answered to their satisfaction.  Arnette Felts, FNP   I, Arnette Felts, FNP, have reviewed all documentation for this visit. The documentation on 07/08/22 for the exam, diagnosis, procedures, and orders are all accurate and complete.   IF YOU HAVE BEEN REFERRED TO A SPECIALIST, IT MAY TAKE 1-2 WEEKS TO SCHEDULE/PROCESS THE REFERRAL. IF YOU HAVE NOT HEARD FROM US/SPECIALIST IN TWO WEEKS, PLEASE GIVE [place] A CALL AT [phone].   THE PATIENT IS ENCOURAGED TO PRACTICE SOCIAL DISTANCING DUE TO THE COVID-19 PANDEMIC.

## 2022-07-08 NOTE — Progress Notes (Unsigned)
Cardiology Office Note   Date:  07/10/2022   ID:  Sheila Reynolds, Sheila Reynolds [birth year], MRN [id number]  PCP:  Dorothyann Peng, MD  Cardiologist:   Kristeen Miss, MD   Chief Complaint  Patient presents with   Coronary Artery Disease   Hypertension        1. Hyperlipidemia 2.  CAD -  orbital atherectomy and PCI using Xience Sierra 3.5 x 33 mm DES to prox / mid LAD  3. Nonsustained ventricular tachycardia 4. Mild to moderate coronary artery disease by cath in 2009. 5. 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 Fannie Knee 6.  Venous insufficiency     Sheila Reynolds is a 77 y.o. female with the above noted hx.  She has continued to have some palpitations.  She has occasional episodes of lightheadedness when she has palpitations. Her main issue has been lots of total body cramps. She has been seen in the emergency room. Her lab work looked fine.  She's had lots of dyspnea especially with exertion. She has a history of sarcoidosis and is followed by Dr. Maple Hudson.  March 11, 2012: Sheila Reynolds has had more palpitations recently.  It Appears that her metoprolol dose was decreased slightly.  We placed an event monitor on her. She did not have any episodes of atrial fibrillation. She has occasional drinker atrial contractions and occasional premature ventricular contractions. There were no life-threatening arrhythmias.  She is doing well from a cardiac standpoint.   She's not had any episodes of syncope.  She has significant shortness of breath especially when climbing stairs. This is likely due to her sarcoidosis. She denies any chest pain. Her BP is a bit elevated.  She avoids salt.    May 02, 2013 :  She presented to see Tereso Newcomer in February with some increased palpitations and lightheadedness.  He recommended that she decrease her caffeine intake. He placed a 21 day event monitor and instructed her to take an extra metoprolol as needed.  The palpitations can occur at any time. They occur with rest and  with exertion. They're not constant. The last 30-45 seconds and occur multiple times through the day.  Sept. 16, 2015:  Still having palpitations.   She has had more leg edema and her medical doctor on Lasix which he now takes about every other day. She was not on potassium replacement. This may be the cause of her increased PVCs.   May 24, 2014:  Sheila Reynolds is a 77 y.o. female who presents for follow up of her palpitations   Still has these palpitations.  No syncope.   Jan. 10, 2017:  Doing ok Still has DOE  Has palpitations on occasion. Not getting much exercise .  expalined a typical exercise program .   Start slow, gradually increase   May 07, 2016;   Sheila Reynolds is seen today for follow up .  Has been having more palpitations Seem to be getting worse  Went to there ER last week -  the ER note does not describe any specific arrhythmias. No passing out but she did get lightheaded when she had palpitations on one occasion   Has rare episodes of atypical CP .  Lasts a second Not associated with exertion .  Walks 1.5 miles 3-4 times a week .   This walking does not cause any palpitation of CP   July 17, 2016 Sheila Reynolds is seen today for follow up  30 day monitor showed no significant arrhythmias  Having leg pain and  swelling  during night and day  Not worsened by exercise  No CP or dyspnea - still walking 1.5 miles 3-4 days a week   December 22, 2016:  Sheila Reynolds had a cath recently that showed a 70 % LAD stenosis . Has right arm pain but this is not exertional . Has pins and needles sensation Has DOE with stairs and carrying something heavy .  Has not really noticed any difference in how she feels on the new meds which include Imdur 30 mg , Plavix 75 ,  + cough, chronic ,  No hemoptysis. Does not exercise regularly .    January 29, 2017: Sheila Reynolds is seen today for a follow-up visit.  She recently had rotational atherectomy and stenting of her proximal LAD  and mid LAD. Still has some pins and needles sensation Has been walking in the mall.  Does not have CP . Has some DOE with that   .  Jul 09, 2017:   Sheila Reynolds is doing well.  She is status post rotational atherectomy and stent placement to her proximal/mid LAD in December, 2018. Had left eye surgery in left eye.    Occasional CP ,  Thinks it might be indigestion .  Not associated with eating or drinking . Pain was on the left side of left breast.   Lasts a few minutes Did not have much pain prior to her PCI to LAD .  Has been exercising .   Exercise does not cause any CP .  Relieved with SL NTG   BP has been elevated.   Aug. 26, 2019: Following her visit in February, she was having some chest pain.  A stress Myoview study was performed and was found to be low risk.  She has normal left ventricular systolic function and no ischemia. She is feeling quite well.  She has not had any recurrent episodes of chest pain. She is exercising some .   Has been having bilateral leg pain and swelling.  She had an arterial Doppler performed for peripheral vascular disease.  As of today the study has not yet been read.  January 29, 2018: Sheila Reynolds is seen today for 2 reasons.  She is been having more shortness of breath than normal.  She also needs preoperative evaluation.  She has a history of coronary artery disease and is status post stenting of her LAD in December 2018. Is having more DOE with any activity , climbing stairs,  Similar to how she felt prior to stenting Resolves after 5- 10 min of rest.  Has not taken a NTG   Also needs to have eyelid  surgery  Scheduled for Dec. 31.   Oct. 27. 2020  Sheila Reynolds is seen today . Hx of CAD - stenting of her LAD in Dec. 2018   Has some dyspnea.  Constant back pain , may last for hours.  Radiates through to her chest .  Has not tried any NTG. Not relieved with alka seltzer.   Has had some R eye vision issues Was found to have had 2 strokes over  the past years. 1 was an older CVA and 1 was a more recent CVA  Has seen a neurologist her in [place]. Carotid duplex scan revealed mild plaque bilaterally but no significant stenoses. Transcranial Doppler revealed some diffuse plaque but no critical stenoses.  Jun 16, 2019:  She has had some worsening shortness of breath as well as some leg edema recently.  The left leg swells worse than  the right leg. Echo from Dec. 2020 - showed normal LV systolic function with grade I diastolic dysfuncton  Venous duplex was normal  She has been seeing the podiatrist down in [place] and was advised to have a venogram for further evaluation.  Nov. 4, 2021:  Sheila Reynolds is seen today  She has had palpitations .   Associated with some ches pain.   Gets  dizzy,   palps will last all day long.    Occurs randomly during the day .   Not associated with exercise  Will place an event monitor .    August 23, 2020: Sheila Reynolds is seen today.  Hx of  CAD , stenting of LAD When I last saw her in November she was having some episodes of chest pains as well as some palpitations. Lexiscan Myoview study from December 20, 2019 was normal. Event monitor from December, 2021 reveals sinus rhythm with rare premature ventricular contractions.  There was no evidence of nonsustained ventricular tachycardia.  Is not exercising as much  Has lots of leg lymphedema   August 20, 2021 Sheila Reynolds is seen today .  Hx of CAD, stenting of LAD, Having some random left sided chest pain - not associated with walking  Seems to occur when she is standing and reaching with her arms  Lexiscan myoview from Dec. 2021 looked good.   Jul 10, 2022 Sheila Reynolds is seen today for follow up of her CAD , stenting of her LAD. Has sarcoidosis and pulmonary HTN  Having some pollen / allergic issues Lots of medical issues ( instability ,  falls frequently )  Has seen neuro for her falling episodes      Past Medical History:  Diagnosis Date    Anemia    3 months ago anemic   Anxiety    on meds   Arthritis    "all over" (01/15/2017)   Asthma    Bronchitis with emphysema    Chest pain    Chronic bronchitis (HCC)    Coronary artery disease    a. 01/2017 she underwent orbital atherectomy/DES to the proxmal LAD and PTCA to ostial D2. 2D Echo 01/15/17 showed mild LVH, EF 60-65%, grade 1 DD.   Family history of anesthesia complication    daughter N/V   Fibromyalgia    GERD (gastroesophageal reflux disease)    on meds   Glaucoma    Heart murmur    History of hiatal hernia    Hx of echocardiogram    Echo (03/2013):  Tech limited; Mild focal basal septal hypertrophy, EF 60-65%, normal RVF   Hyperlipidemia    Hypertension    Nonsustained ventricular tachycardia (HCC)    OSA on CPAP    Premature atrial contractions    PVC (premature ventricular contraction)    a. Holter 12/16: NSR, occ PAC,PVCs   Sarcoidosis    Type II diabetes mellitus (HCC)     Past Surgical History:  Procedure Laterality Date   BREAST SURGERY     CARDIAC CATHETERIZATION  05/04/2007   reveals overall normal left ventricular systolic function. Ejection fraction 65-70%   CATARACT EXTRACTION W/PHACO Right 07/20/2013   Procedure: CATARACT EXTRACTION PHACO AND INTRAOCULAR LENS PLACEMENT (IOC);  Surgeon: Chalmers Guest, MD;  Location: [facility] OR;  Service: Ophthalmology;  Laterality: Right;   COLONOSCOPY W/ BIOPSIES AND POLYPECTOMY     COLONOSCOPY WITH PROPOFOL N/A 09/07/2014   Procedure: COLONOSCOPY WITH PROPOFOL;  Surgeon: Charna Elizabeth, MD;  Location: [facility];  Service: Endoscopy;  Laterality:  N/A;   COLONOSCOPY WITH PROPOFOL N/A 01/10/2020   Procedure: COLONOSCOPY WITH PROPOFOL;  Surgeon: Charna Elizabeth, MD;  Location: [facility];  Service: Endoscopy;  Laterality: N/A;   CORONARY ANGIOPLASTY WITH STENT PLACEMENT  01/15/2017   CORONARY ATHERECTOMY N/A 01/15/2017   Procedure: CORONARY ATHERECTOMY;  Surgeon: Yvonne Kendall, MD;  Location: [facility];   Service: Cardiovascular;  Laterality: N/A;   CORONARY BALLOON ANGIOPLASTY N/A 01/15/2017   Procedure: CORONARY BALLOON ANGIOPLASTY;  Surgeon: Yvonne Kendall, MD;  Location: [facility];  Service: Cardiovascular;  Laterality: N/A;   CORONARY PRESSURE/FFR STUDY N/A 12/12/2016   Procedure: INTRAVASCULAR PRESSURE WIRE/FFR STUDY;  Surgeon: Yvonne Kendall, MD;  Location: [facility];  Service: Cardiovascular;  Laterality: N/A;   CORONARY STENT INTERVENTION N/A 01/15/2017   Procedure: CORONARY STENT INTERVENTION;  Surgeon: Yvonne Kendall, MD;  Location: [facility];  Service: Cardiovascular;  Laterality: N/A;   ESOPHAGOGASTRODUODENOSCOPY (EGD) WITH PROPOFOL N/A 09/07/2014   Procedure: ESOPHAGOGASTRODUODENOSCOPY (EGD) WITH PROPOFOL;  Surgeon: Charna Elizabeth, MD;  Location: [facility];  Service: Endoscopy;  Laterality: N/A;   EXTERNAL EAR SURGERY Bilateral 1970s   tumors removed   EYE SURGERY Left 2019   cataract extraction    EYE SURGERY Right 02/09/2018   eyelid surgery    MINI SHUNT INSERTION Right 07/20/2013   Procedure: INSERTION OF GLAUCOMA FILTRATION DEVICE RIGHT EYE;  Surgeon: Chalmers Guest, MD;  Location: [facility] OR;  Service: Ophthalmology;  Laterality: Right;   MITOMYCIN C APPLICATION Right 07/20/2013   Procedure: MITOMYCIN C APPLICATION;  Surgeon: Chalmers Guest, MD;  Location: [facility] OR;  Service: Ophthalmology;  Laterality: Right;   MITOMYCIN C APPLICATION Right 02/21/2015   Procedure: MITOMYCIN C APPLICATION RIGHT EYE;  Surgeon: Chalmers Guest, MD;  Location: [facility] OR;  Service: Ophthalmology;  Laterality: Right;   PLACEMENT OF BREAST IMPLANTS Bilateral 1992   "took all my breast tissue out; put implants in;fibrocystic breast disease "   POLYPECTOMY  01/10/2020   Procedure: POLYPECTOMY;  Surgeon: Charna Elizabeth, MD;  Location: [facility];  Service: Endoscopy;;   RIGHT/LEFT HEART CATH AND CORONARY ANGIOGRAPHY N/A 12/12/2016   Procedure: RIGHT/LEFT HEART CATH AND CORONARY ANGIOGRAPHY;   Surgeon: Yvonne Kendall, MD;  [facility];  Service: Cardiovascular;  Laterality: N/A;   TONSILLECTOMY     TOTAL ABDOMINAL HYSTERECTOMY  1982   TRABECULECTOMY Right 02/21/2015   Procedure: TRABECULECTOMY WITH [facility] ON THE RIGHT EYE;  Surgeon: Chalmers Guest, MD;  Location: [facility] OR;  Service: Ophthalmology;  Laterality: Right;     Current Outpatient Medications  Medication Sig Dispense Refill   acetaminophen (TYLENOL) 500 MG tablet Take 1,000 mg by mouth 2 (two) times daily as needed for moderate pain or headache.     albuterol (PROVENTIL) (2.5 MG/3ML) 0.083% nebulizer solution Take 3 mLs (2.5 mg total) by nebulization daily as needed for wheezing or shortness of breath. 75 mL 12   albuterol (VENTOLIN HFA) 108 (90 Base) MCG/ACT inhaler Inhale 2 puffs into the lungs every 6 (six) hours as needed for wheezing or shortness of breath. 18 g 12   amoxicillin-clavulanate (AUGMENTIN) 875-125 MG tablet Take 1 tablet by mouth 2 (two) times daily. 14 tablet 0   ANORO ELLIPTA 62.5-25 MCG/ACT AEPB INHALE 1 PUFF INTO THE LUNGS DAILY AT 6 AM 60 each 3   aspirin EC 81 MG tablet Take 81 mg by mouth daily. Swallow whole.     atorvastatin (LIPITOR) 80 MG tablet Take 1 tablet (80 mg total) by  mouth daily. 90 tablet 3   azaTHIOprine (IMURAN) 50 MG tablet Take by mouth.     Blood Glucose Monitoring Suppl (ACCU-CHEK AVIVA PLUS) w/Device KIT Use to check blood sugars 3 times a day. Dx code e11.65 1 kit 3   brimonidine (ALPHAGAN P) 0.1 % SOLN Place 1 drop into both eyes 2 (two) times daily.      brinzolamide (AZOPT) 1 % ophthalmic suspension Place 1 drop into both eyes 3 (three) times daily.     carvedilol (COREG) 25 MG tablet Take 1 tablet (25 mg total) by mouth 2 (two) times daily. 180 tablet 3   cetirizine (ZYRTEC ALLERGY) 10 MG tablet Take 1 tablet (10 mg total) by mouth daily. 30 tablet 2   clopidogrel (PLAVIX) 75 MG tablet TAKE 1 TABLET(75 MG) BY MOUTH DAILY 90 tablet 3   EPINEPHrine 0.3 mg/0.3 mL IJ  SOAJ injection Inject 0.3 mg into the muscle as needed for anaphylaxis. 1 each 0   ezetimibe (ZETIA) 10 MG tablet Take 1 tablet (10 mg total) by mouth daily. 90 tablet 3   furosemide (LASIX) 40 MG tablet Take 1 tablet (40 mg total) by mouth daily as needed. 90 tablet 1   isosorbide mononitrate (IMDUR) 60 MG 24 hr tablet TAKE 1 TABLET(60 MG) BY MOUTH DAILY 90 tablet 3   JANUMET 50-500 MG tablet TAKE 1 TABLET BY MOUTH TWICE DAILY WITH A MEAL 90 tablet 1   ketorolac (ACULAR) 0.5 % ophthalmic solution Place 1 drop into the right eye 3 (three) times daily.     Lancets (ONETOUCH DELICA PLUS LANCET33G) MISC CHECK BLOOD SUGAR BEFORE BREAKFAST AND DINNER 100 each 1   levETIRAcetam (KEPPRA XR) 500 MG 24 hr tablet Take 1 tablet (500 mg total) by mouth daily. 30 tablet 5   montelukast (SINGULAIR) 10 MG tablet TAKE 1 TABLET BY MOUTH EVERY DAY 90 tablet 1   Nebulizers (COMPRESSOR/NEBULIZER) MISC Use as directed 1 each 0   nitroGLYCERIN (NITROSTAT) 0.4 MG SL tablet Place 1 tablet (0.4 mg total) under the tongue every 5 (five) minutes as needed for chest pain. 25 tablet prn   pantoprazole (PROTONIX) 40 MG tablet TAKE 1 TABLET(40 MG) BY MOUTH DAILY 90 tablet 3   Polyvinyl Alcohol-Povidone PF (REFRESH) 1.4-0.6 % SOLN Place 1-2 drops into both eyes 3 (three) times daily as needed (dry/irritated eyes.).     potassium chloride SA (KLOR-CON) 20 MEQ tablet Take 1 tablet (20 mEq total) by mouth every other day. 90 tablet 1   prednisoLONE acetate (PRED FORTE) 1 % ophthalmic suspension Apply to eye.     pregabalin (LYRICA) 75 MG capsule TAKE 1 CAPSULE(75 MG) BY MOUTH TWICE DAILY 180 capsule 2   promethazine-dextromethorphan (PROMETHAZINE-DM) 6.25-15 MG/5ML syrup Take 5 mLs by mouth 4 (four) times daily as needed for cough. 118 mL 0   RHOPRESSA 0.02 % SOLN Place 1 drop into the left eye at bedtime.     tiZANidine (ZANAFLEX) 4 MG tablet TAKE 1 TABLET(4 MG) BY MOUTH DAILY 30 tablet 1   traMADol (ULTRAM) 50 MG tablet Take by  mouth.     Travoprost, BAK Free, (TRAVATAN) 0.004 % SOLN ophthalmic solution Place 1 drop into the right eye at bedtime.     ALPRAZolam (XANAX) 0.25 MG tablet Take 1 tablet (0.25 mg total) by mouth as directed. (Patient not taking: Reported on 07/10/2022) 2 tablet 0   No current facility-administered medications for this visit.    Allergies:   Crestor [rosuvastatin calcium], Demerol  [meperidine  hcl], and Shellfish allergy    Social History:  The patient  reports that she quit smoking about 42 years ago. Her smoking use included cigarettes. She has a 4.00 pack-year smoking history. She has never used smokeless tobacco. She reports that she does not drink alcohol and does not use drugs.   Family History:  The patient's family history includes Cancer in her mother and paternal grandmother; Diabetes in her brother and father; Glaucoma in her brother and father; Heart disease in her father and paternal grandmother.    ROS: Noted in current history, otherwise review of systems is negative.   Physical Exam: Blood pressure (!) 146/82, pulse 82, height 5\' 2"  (1.575 m), weight 163 lb 3.2 oz (74 kg), SpO2 98 %.      GEN:  Well nourished, well developed in no acute distress HEENT: Normal NECK: No JVD; No carotid bruits LYMPHATICS: No lymphadenopathy CARDIAC: RRR , no murmurs, rubs, gallops RESPIRATORY:  Clear to auscultation without rales, wheezing or rhonchi  ABDOMEN: Soft, non-tender, non-distended MUSCULOSKELETAL:  thick ankles, no pitting  SKIN: Warm and dry NEUROLOGIC:  Alert and oriented x 3.very unsteady on her feet     EKG:      Recent Labs: 05/07/2022: ALT 18; Hemoglobin 12.7; Magnesium 2.2; Platelets 208 06/05/2022: BUN 22; Creatinine, Ser 1.23; Potassium 4.5; Sodium 139    Lipid Panel    Component Value Date/Time   CHOL 129 03/03/2022 1353   TRIG 71 03/03/2022 1353   HDL 46 03/03/2022 1353   CHOLHDL 2.8 03/03/2022 1353   CHOLHDL 3 05/24/2014 1650   VLDL 20.8  05/24/2014 1650   LDLCALC 69 03/03/2022 1353      Wt Readings from Last 3 Encounters:  07/10/22 163 lb 3.2 oz (74 kg)  07/08/22 162 lb 6.4 oz (73.7 kg)  06/05/22 161 lb 9.6 oz (73.3 kg)      Other studies Reviewed: Additional studies/ records that were reviewed today include: . Review of the above records demonstrates:    ASSESSMENT AND PLAN:   1.   Coronary artery disease:    She denies any angina.   2. Hyperlipidemia  : Last lipids were drawn in January, 2024.  Her LDL is 69.  Triglyceride level 71.  Total cholesterol is 129.   4. Nonsustained ventricular tachycardia-   no recurrent NSVT .    5.  Sarcoidosis -    6.  Leg edema :      7.  Pulmonary fibrosis:     8.  Unsteadiness/frequent falls.  She has had recent unsteadiness.  She does not get dizzy but states that she just loses her balance.  Will check a TSH since it has not been checked this year.  Will we will forward these results to her primary medical doctor if it is abnormal.  Current medicines are reviewed at length with the patient today.  The patient does not have concerns regarding medicines.  The following changes have been made:  no change  Labs/ tests ordered today include:   Orders Placed This Encounter  Procedures   TSH    Will see her back in the office in 1 year.  Disposition: She appears to be very stable.  We will have her transfer to Dr. Shari Prows.  She will see see Dr. Shari Prows in 1 year.   Signed, Kristeen Miss, MD  07/10/2022 3:19 PM    [facility] [address] [place], [place], [place]  [address] Phone: [phone]; Fax: [phone]  

## 2022-07-09 ENCOUNTER — Ambulatory Visit: Payer: Medicare Other | Admitting: Family Medicine

## 2022-07-10 ENCOUNTER — Ambulatory Visit: Payer: Medicare Other | Attending: Cardiovascular Disease | Admitting: Cardiovascular Disease

## 2022-07-10 ENCOUNTER — Encounter: Payer: Self-pay | Admitting: Cardiovascular Disease

## 2022-07-10 VITALS — BP 146/82 | HR 82 | Ht 62.0 in | Wt 163.2 lb

## 2022-07-10 DIAGNOSIS — I2510 Atherosclerotic heart disease of native coronary artery without angina pectoris: Secondary | ICD-10-CM | POA: Diagnosis not present

## 2022-07-10 DIAGNOSIS — R296 Repeated falls: Secondary | ICD-10-CM | POA: Diagnosis not present

## 2022-07-10 DIAGNOSIS — R2681 Unsteadiness on feet: Secondary | ICD-10-CM | POA: Insufficient documentation

## 2022-07-10 DIAGNOSIS — R5383 Other fatigue: Secondary | ICD-10-CM | POA: Insufficient documentation

## 2022-07-10 LAB — TSH: TSH: 1.12 u[IU]/mL (ref 0.450–4.500)

## 2022-07-10 NOTE — Patient Instructions (Signed)
Medication Instructions:  Your physician recommends that you continue on your current medications as directed. Please refer to the Current Medication list given to you today.  *If you need a refill on your cardiac medications before your next appointment, please call your pharmacy*   Lab Work: TSH today If you have labs (blood work) drawn today and your tests are completely normal, you will receive your results only by: MyChart Message (if you have MyChart) OR A paper copy in the mail If you have any lab test that is abnormal or we need to change your treatment, we will call you to review the results.   Testing/Procedures: NONE   Follow-Up: At [facility], you and your health needs are our priority.  As part of our continuing mission to provide you with exceptional heart care, we have created designated Provider Care Teams.  These Care Teams include your primary Cardiologist (physician) and Advanced Practice Providers (APPs -  Physician Assistants and Nurse Practitioners) who all work together to provide you with the care you need, when you need it.  We recommend signing up for the patient portal called "MyChart".  Sign up information is provided on this After Visit Summary.  MyChart is used to connect with patients for Virtual Visits (Telemedicine).  Patients are able to view lab/test results, encounter notes, upcoming appointments, etc.  Non-urgent messages can be sent to your provider as well.   To learn more about what you can do with MyChart, go to [email or web address].    Your next appointment:   1 year(s)  Provider:   Kristeen Miss, MD

## 2022-07-18 LAB — POC SOFIA 2 FLU + SARS ANTIGEN FIA
Influenza A, POC: NEGATIVE
Influenza B, POC: NEGATIVE
SARS Coronavirus 2 Ag: NEGATIVE

## 2022-07-22 ENCOUNTER — Encounter: Payer: Self-pay | Admitting: Internal Medicine

## 2022-07-22 ENCOUNTER — Ambulatory Visit (INDEPENDENT_AMBULATORY_CARE_PROVIDER_SITE_OTHER): Payer: Medicare Other | Admitting: Internal Medicine

## 2022-07-22 VITALS — BP 122/60 | HR 87 | Ht 62.0 in | Wt 164.4 lb

## 2022-07-22 DIAGNOSIS — J84112 Idiopathic pulmonary fibrosis: Secondary | ICD-10-CM | POA: Diagnosis not present

## 2022-07-22 DIAGNOSIS — R918 Other nonspecific abnormal finding of lung field: Secondary | ICD-10-CM

## 2022-07-22 DIAGNOSIS — J42 Unspecified chronic bronchitis: Secondary | ICD-10-CM

## 2022-07-22 DIAGNOSIS — J441 Chronic obstructive pulmonary disease with (acute) exacerbation: Secondary | ICD-10-CM

## 2022-07-22 MED ORDER — DOXYCYCLINE HYCLATE 100 MG PO TABS: 100.0000 mg | ORAL_TABLET | Freq: Two times a day (BID) | ORAL | 0 refills | Status: DC

## 2022-07-22 NOTE — Progress Notes (Unsigned)
HPI  F former smoker followed for bronchitis, hx  Occular sarcoid, bronchitis/ nodules, OSA complicated by GERD, glaucoma, DM2 NPSG 12/25/03- AHI  2.9/ hr, desaturation to 91%, body weight 164 lbs NPSG-10/18/13- Mild OSA, AHI 9.3/ hr, weight 164 lbs  ACE 09/17/15-47-WNL ACE level 08/18/16-30 ACE level 05/14/20- 22 PFT: 01/07/2011-FEV1 1.59/79%, FEV1/FVC 0.73, FEF 25-75% improved to 38% with bronchodilator. TLC 90% DLCO 55% Office Spirometry 06/09/16- moderate restriction of exhaled volume. FVC 1.51/68%, FEV1 1.20/70%, ratio 0.79, FEF 25-75% 1.19/73% Nuclear Stress Test 09/04/16- EF 65%, low risk. ECHO 07/31/16- Gr 1 DD Walk test on room air 12/21/2017-minimal saturation 94%, maximum heart rate 118/minute with no stops walking 3 laps x180 m. Office Spirometry 12/21/17 PFT 04/13/20- Minimal Obstruction and Minimal Restriction, no resp to BD, Moderately severe DLCO defect WALK TEST 4/422- 2 laps, min O2 sat 94%, max HR 127.  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 Now on azathioprine by Dr Deanne Coffer -----States she has pain in her back that radiates to the front she states this has been happening for a while now. Haven't used cpap machine in a while Just resolving another  COVID infection.  Coughing a lot productive of yellow over the past month.  We discussed antibiotics and sputum cultures for this. She has a follow-up with Dr. Isaiah Serge for ILD in 6 months. We reviewed chest CT 2 new nodules that look inflammatory..  We will treat now for common bacterial bronchitis and follow recommendation to repeat CT in 3 months to recheck the nodules. Up between 3 and 5 times at night because of bladder problems for which she has chosen no surgery.  She will have to follow-up with urology/GYN on this. This amount of disturbance is significantly interfering with CPAP use. Continues with back and chest pains which seem musculoskeletal/nerve root related.  She has seen Dr. Elease Hashimoto for cardiology. HRCT chest 02/18/22- IMPRESSION: 1. Clustered solid pulmonary nodules of the right upper lobe with associated bronchiectasis and two new solid pulmonary nodules measuring up to 6 mm, likely due to worsening infectious process. Follow-up chest CT is recommended in 3 months to ensure resolution. 2. 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 No evidence of progression when compared with most recent prior exam. 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 Am Rosezetta Schlatter Crit Care Med Vol 198, Iss 5, [id number], Oct 11 2016. 3. Severe left main and three-vessel coronary artery calcifications. 4. Aortic Atherosclerosis (ICD10-I70.0) and Emphysema (ICD10-J43.9).  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 CPAP  Luna- 5-15 / [employer]     replacement Dec2022 Download-compliance Body weight today-164 lbs ------Complains of productive cough with clear phlegm-states it had been green/yellowish, chest congestion, wheezing,some sob Symptoms started over a month ago. PCP rx'd augmentin- temporary help. We will contact her DME for CPAP download.  She indicates routine use. Active bronchitis symptoms over the past month, shared with whole family.  She continues azathioprine from her rheumatologist. Note that she wears elastic hose routinely but could only pull on the left 1 today and could not get the right one on without assistance.  She says her cardiologist does not think the peripheral edema is cardiogenic. We discussed her last CT noting to new nodules and discussed acute symptoms superimposed on her chronic more stable UIP pattern. We will update chest CT to reassess the nodules.  We will seek to culture sputum before starting doxycycline.  ROS-see HPI     + = positive Constitutional:   No-   weight loss, night sweats, fevers, chills, fatigue, lassitude. HEENT:   +  headaches, difficulty swallowing, tooth/dental problems, sore throat,       Some  sneezing, itching, ear ache, +nasal congestion, post nasal drip,  CV: + chest pain, no-orthopnea, PND, +swelling in lower extremities, anasarca, dizziness, palpitations Resp: +  shortness of breath with exertion not at rest.            +productive cough,  + non-productive cough,  No- coughing up of blood.              + color of mucus.   wheezing.   Skin: No-   rash or lesions. GI:  +  heartburn, indigestion, abdominal pain, nausea, vomiting,  GU:  MS:  +   joint pain or swelling, + back pain Neuro-     nothing unusual Psych:  No- change in mood or affect. No depression or anxiety.  No memory loss.  OBJ  Afebrile General- Alert, Oriented, Affect tearful, Distress- none acute,  Skin- rash-none, lesions- none, excoriation- none Lymphadenopathy- none Head- atraumatic            Eyes- Gross vision intact, PERRLA, conjunctivae clear secretions- not injected            Ears- Hearing aid             Nose-  turbinate edema, no-Septal dev, mucus, polyps, erosion, perforation             Throat- Mallampati III , mucosa  , drainage- none, tonsils- atrophic Neck- flexible , trachea midline, no stridor , thyroid nl, carotid no bruit Chest - symmetrical excursion , unlabored           Heart/CV- RRR , no murmur , no gallop  , no rub, nl s1 s2                           - JVD- none , edema+3, stasis changes- none, varices- none           Lung-  crackles+, Cough+, wheeze-none, rhonchi-none , dullness-none, rub- none           Chest  wall-  Abd- No HSM Br/ Gen/ Rectal- Not done, not indicated Extrem- cyanosis- none, clubbing, none, atrophy- none, strength- nl. +elastic hose Neuro- grossly intact to observation

## 2022-07-22 NOTE — Patient Instructions (Addendum)
Order- Sputum culture and sensitivity- routine, fungal, AFB    dx chronic bronchitis  Order- CT chest no contrast    dx lung nodules  Script sent for doxycycline antibiotic.  Wait to start this until after you have obtained your sputum specimen.

## 2022-07-23 ENCOUNTER — Encounter: Payer: Self-pay | Admitting: Internal Medicine

## 2022-07-23 NOTE — Assessment & Plan Note (Signed)
Acute bronchitic exacerbation shared with family indicating likely viral onset. Plan-there is underlying bronchiectasis so I would like sputum cultures especially looking for atypical organisms.  Once this is obtained we will start doxycycline for the current acute process.

## 2022-07-23 NOTE — Assessment & Plan Note (Signed)
To new solid right upper lobe nodules on chest CT 02/18/2022 Plan-repeat CT now

## 2022-07-24 ENCOUNTER — Other Ambulatory Visit: Payer: Medicare Other

## 2022-07-24 DIAGNOSIS — J42 Unspecified chronic bronchitis: Secondary | ICD-10-CM

## 2022-07-25 ENCOUNTER — Ambulatory Visit (HOSPITAL_COMMUNITY)
Admission: RE | Admit: 2022-07-25 | Discharge: 2022-07-25 | Disposition: A | Discharge status: Home / Self Care | Payer: Medicare Other | Source: Ambulatory Visit | Attending: Internal Medicine | Admitting: Internal Medicine

## 2022-07-25 DIAGNOSIS — J432 Centrilobular emphysema: Secondary | ICD-10-CM | POA: Diagnosis not present

## 2022-07-25 DIAGNOSIS — J479 Bronchiectasis, uncomplicated: Secondary | ICD-10-CM | POA: Diagnosis not present

## 2022-07-25 DIAGNOSIS — R918 Other nonspecific abnormal finding of lung field: Secondary | ICD-10-CM | POA: Insufficient documentation

## 2022-07-28 DIAGNOSIS — E119 Type 2 diabetes mellitus without complications: Secondary | ICD-10-CM | POA: Diagnosis not present

## 2022-07-28 DIAGNOSIS — H2013 Chronic iridocyclitis, bilateral: Secondary | ICD-10-CM | POA: Diagnosis not present

## 2022-07-28 DIAGNOSIS — H401122 Primary open-angle glaucoma, left eye, moderate stage: Secondary | ICD-10-CM | POA: Diagnosis not present

## 2022-07-28 DIAGNOSIS — H401113 Primary open-angle glaucoma, right eye, severe stage: Secondary | ICD-10-CM | POA: Diagnosis not present

## 2022-08-01 ENCOUNTER — Other Ambulatory Visit: Payer: Self-pay | Admitting: Internal Medicine

## 2022-08-05 ENCOUNTER — Other Ambulatory Visit: Payer: Self-pay | Admitting: Internal Medicine

## 2022-08-05 ENCOUNTER — Other Ambulatory Visit: Payer: Self-pay

## 2022-08-05 MED ORDER — PREGABALIN 75 MG PO CAPS: ORAL_CAPSULE | ORAL | 1 refills | Status: DC

## 2022-08-05 MED ORDER — AMOXICILLIN-POT CLAVULANATE 875-125 MG PO TABS: 1.0000 | ORAL_TABLET | Freq: Two times a day (BID) | ORAL | 0 refills | Status: DC

## 2022-08-18 ENCOUNTER — Other Ambulatory Visit: Payer: Self-pay

## 2022-08-25 LAB — FUNGUS CULTURE W SMEAR
MICRO NUMBER:: 15079013
SMEAR:: NONE SEEN
SPECIMEN QUALITY:: ADEQUATE

## 2022-08-25 LAB — RESPIRATORY CULTURE OR RESPIRATORY AND SPUTUM CULTURE
MICRO NUMBER:: 15079014
RESULT:: NORMAL
SPECIMEN QUALITY:: ADEQUATE

## 2022-08-28 ENCOUNTER — Encounter: Payer: Self-pay | Admitting: Neurology

## 2022-09-15 ENCOUNTER — Other Ambulatory Visit: Payer: Self-pay | Admitting: Cardiovascular Disease

## 2022-09-19 ENCOUNTER — Other Ambulatory Visit: Payer: Self-pay

## 2022-09-19 MED ORDER — PANTOPRAZOLE SODIUM 40 MG PO TBEC: 40.0000 mg | DELAYED_RELEASE_TABLET | Freq: Every day | ORAL | 2 refills | Status: DC

## 2022-09-22 ENCOUNTER — Encounter: Payer: Self-pay | Admitting: Internal Medicine

## 2022-09-22 ENCOUNTER — Ambulatory Visit (INDEPENDENT_AMBULATORY_CARE_PROVIDER_SITE_OTHER): Payer: Medicare Other | Admitting: Internal Medicine

## 2022-09-22 VITALS — BP 142/90 | HR 75 | Temp 97.9°F | Ht 62.0 in | Wt 161.8 lb

## 2022-09-22 DIAGNOSIS — M79605 Pain in left leg: Secondary | ICD-10-CM

## 2022-09-22 DIAGNOSIS — E1169 Type 2 diabetes mellitus with other specified complication: Secondary | ICD-10-CM | POA: Diagnosis not present

## 2022-09-22 DIAGNOSIS — E785 Hyperlipidemia, unspecified: Secondary | ICD-10-CM

## 2022-09-22 DIAGNOSIS — I5032 Chronic diastolic (congestive) heart failure: Secondary | ICD-10-CM | POA: Diagnosis not present

## 2022-09-22 DIAGNOSIS — F321 Major depressive disorder, single episode, moderate: Secondary | ICD-10-CM

## 2022-09-22 DIAGNOSIS — I700 Atherosclerosis of aorta: Secondary | ICD-10-CM | POA: Diagnosis not present

## 2022-09-22 DIAGNOSIS — M79675 Pain in left toe(s): Secondary | ICD-10-CM

## 2022-09-22 DIAGNOSIS — I110 Hypertensive heart disease with heart failure: Secondary | ICD-10-CM

## 2022-09-22 NOTE — Progress Notes (Unsigned)
I,[name], CMA,acting as a Neurosurgeon for Sheila Aliment, MD.,have documented all relevant documentation on the behalf of Sheila Aliment, MD,as directed by  Sheila Aliment, MD while in the presence of Sheila Aliment, MD.  Subjective:  Patient ID: Sheila Reynolds , female    DOB: April 14, 1945 , 77 y.o.   MRN: [id number]  Chief Complaint  Patient presents with  . Diabetes  . Hypertension    HPI  Patient presents today for DM/BP check. She reports compliance with meds. However, after further questioning about her blood pressure -she admits she did not take her meds.  She states BP has been elevated and that cardiologist is aware and has not made any medication changes.     Diabetes She presents for her follow-up diabetic visit. She has type 2 diabetes mellitus. Her disease course has been stable. There are no hypoglycemic associated symptoms. Pertinent negatives for hypoglycemia include no dizziness or headaches. Pertinent negatives for diabetes include no blurred vision, no chest pain, no polydipsia, no polyphagia, no polyuria and no weakness. There are no hypoglycemic complications. Diabetic complications include heart disease. Her weight is stable. Her breakfast blood glucose is taken between 7-8 am. Her breakfast blood glucose range is generally 90-110 mg/dl. An ACE inhibitor/angiotensin II receptor blocker is being taken. Eye exam is current.  Hypertension This is a chronic problem. The current episode started more than 1 year ago. The problem has been gradually improving since onset. The problem is controlled. Pertinent negatives include no blurred vision, chest pain, headaches, orthopnea, palpitations or shortness of breath. Risk factors for coronary artery disease include diabetes mellitus, dyslipidemia, post-menopausal state and sedentary lifestyle. The current treatment provides moderate improvement. Compliance problems include exercise.      Past Medical History:  Diagnosis  Date  . Anemia    3 months ago anemic  . Anxiety    on meds  . Arthritis    "all over" (01/15/2017)  . Asthma   . Bronchitis with emphysema   . Chest pain   . Chronic bronchitis (HCC)   . Coronary artery disease    a. 01/2017 she underwent orbital atherectomy/DES to the proxmal LAD and PTCA to ostial D2. 2D Echo 01/15/17 showed mild LVH, EF 60-65%, grade 1 DD.  . Family history of anesthesia complication    daughter N/V  . Fibromyalgia   . GERD (gastroesophageal reflux disease)    on meds  . Glaucoma   . Heart murmur   . History of hiatal hernia   . Hx of echocardiogram    Echo (03/2013):  Tech limited; Mild focal basal septal hypertrophy, EF 60-65%, normal RVF  . Hyperlipidemia   . Hypertension   . Nonsustained ventricular tachycardia (HCC)   . OSA on CPAP   . Premature atrial contractions   . PVC (premature ventricular contraction)    a. Holter 12/16: NSR, occ PAC,PVCs  . Sarcoidosis   . Type II diabetes mellitus (HCC)      Family History  Problem Relation Age of Onset  . Heart disease Father   . Diabetes Father   . Glaucoma Father   . Cancer Mother        unknown type, ?lung  . Heart disease Paternal Grandmother   . Cancer Paternal Grandmother        unknown type  . Diabetes Brother   . Glaucoma Brother      Current Outpatient Medications:  .  acetaminophen (TYLENOL) 500 MG tablet, Take  1,000 mg by mouth 2 (two) times daily as needed for moderate pain or headache., Disp: , Rfl:  .  albuterol (PROVENTIL) (2.5 MG/3ML) 0.083% nebulizer solution, Take 3 mLs (2.5 mg total) by nebulization daily as needed for wheezing or shortness of breath., Disp: 75 mL, Rfl: 12 .  albuterol (VENTOLIN HFA) 108 (90 Base) MCG/ACT inhaler, Inhale 2 puffs into the lungs every 6 (six) hours as needed for wheezing or shortness of breath., Disp: 18 g, Rfl: 12 .  amoxicillin-clavulanate (AUGMENTIN) 875-125 MG tablet, Take 1 tablet by mouth 2 (two) times daily., Disp: 14 tablet, Rfl: 0 .   ANORO ELLIPTA 62.5-25 MCG/ACT AEPB, INHALE 1 PUFF INTO THE LUNGS DAILY AT 6 AM, Disp: 60 each, Rfl: 3 .  aspirin EC 81 MG tablet, Take 81 mg by mouth daily. Swallow whole., Disp: , Rfl:  .  atorvastatin (LIPITOR) 80 MG tablet, Take 1 tablet (80 mg total) by mouth daily., Disp: 90 tablet, Rfl: 3 .  azaTHIOprine (IMURAN) 50 MG tablet, Take by mouth., Disp: , Rfl:  .  Blood Glucose Monitoring Suppl (ACCU-CHEK AVIVA PLUS) w/Device KIT, Use to check blood sugars 3 times a day. Dx code e11.65, Disp: 1 kit, Rfl: 3 .  brimonidine (ALPHAGAN P) 0.1 % SOLN, Place 1 drop into both eyes 2 (two) times daily. , Disp: , Rfl:  .  brinzolamide (AZOPT) 1 % ophthalmic suspension, Place 1 drop into both eyes 3 (three) times daily., Disp: , Rfl:  .  carvedilol (COREG) 25 MG tablet, Take 1 tablet (25 mg total) by mouth 2 (two) times daily., Disp: 180 tablet, Rfl: 3 .  cetirizine (ZYRTEC ALLERGY) 10 MG tablet, Take 1 tablet (10 mg total) by mouth daily., Disp: 30 tablet, Rfl: 2 .  clopidogrel (PLAVIX) 75 MG tablet, TAKE 1 TABLET(75 MG) BY MOUTH DAILY, Disp: 90 tablet, Rfl: 3 .  doxycycline (VIBRA-TABS) 100 MG tablet, Take 1 tablet (100 mg total) by mouth 2 (two) times daily., Disp: 14 tablet, Rfl: 0 .  EPINEPHrine 0.3 mg/0.3 mL IJ SOAJ injection, Inject 0.3 mg into the muscle as needed for anaphylaxis., Disp: 1 each, Rfl: 0 .  ezetimibe (ZETIA) 10 MG tablet, Take 1 tablet (10 mg total) by mouth daily., Disp: 90 tablet, Rfl: 3 .  furosemide (LASIX) 40 MG tablet, Take 1 tablet (40 mg total) by mouth daily as needed., Disp: 90 tablet, Rfl: 1 .  isosorbide mononitrate (IMDUR) 60 MG 24 hr tablet, TAKE 1 TABLET(60 MG) BY MOUTH DAILY, Disp: 90 tablet, Rfl: 2 .  JANUMET 50-500 MG tablet, TAKE 1 TABLET BY MOUTH TWICE DAILY WITH A MEAL, Disp: 90 tablet, Rfl: 1 .  ketorolac (ACULAR) 0.5 % ophthalmic solution, Place 1 drop into the right eye 3 (three) times daily., Disp: , Rfl:  .  Lancets (ONETOUCH DELICA PLUS LANCET33G) MISC, CHECK  BLOOD SUGAR BEFORE BREAKFAST AND DINNER, Disp: 100 each, Rfl: 1 .  levETIRAcetam (KEPPRA XR) 500 MG 24 hr tablet, Take 1 tablet (500 mg total) by mouth daily., Disp: 30 tablet, Rfl: 5 .  montelukast (SINGULAIR) 10 MG tablet, TAKE 1 TABLET BY MOUTH EVERY DAY, Disp: 90 tablet, Rfl: 1 .  Nebulizers (COMPRESSOR/NEBULIZER) MISC, Use as directed, Disp: 1 each, Rfl: 0 .  nitroGLYCERIN (NITROSTAT) 0.4 MG SL tablet, Place 1 tablet (0.4 mg total) under the tongue every 5 (five) minutes as needed for chest pain., Disp: 25 tablet, Rfl: prn .  pantoprazole (PROTONIX) 40 MG tablet, Take 1 tablet (40 mg total) by mouth  daily., Disp: 90 tablet, Rfl: 2 .  Polyvinyl Alcohol-Povidone PF (REFRESH) 1.4-0.6 % SOLN, Place 1-2 drops into both eyes 3 (three) times daily as needed (dry/irritated eyes.)., Disp: , Rfl:  .  potassium chloride SA (KLOR-CON) 20 MEQ tablet, Take 1 tablet (20 mEq total) by mouth every other day., Disp: 90 tablet, Rfl: 1 .  prednisoLONE acetate (PRED FORTE) 1 % ophthalmic suspension, Apply to eye., Disp: , Rfl:  .  pregabalin (LYRICA) 75 MG capsule, TAKE 1 CAPSULE(75 MG) BY MOUTH TWICE DAILY, Disp: 180 capsule, Rfl: 1 .  RHOPRESSA 0.02 % SOLN, Place 1 drop into the left eye at bedtime., Disp: , Rfl:  .  tiZANidine (ZANAFLEX) 4 MG tablet, TAKE 1 TABLET(4 MG) BY MOUTH DAILY, Disp: 30 tablet, Rfl: 1 .  traMADol (ULTRAM) 50 MG tablet, Take by mouth., Disp: , Rfl:  .  Travoprost, BAK Free, (TRAVATAN) 0.004 % SOLN ophthalmic solution, Place 1 drop into the right eye at bedtime., Disp: , Rfl:  .  promethazine-dextromethorphan (PROMETHAZINE-DM) 6.25-15 MG/5ML syrup, Take 5 mLs by mouth 4 (four) times daily as needed for cough. (Patient not taking: Reported on 09/22/2022), Disp: 118 mL, Rfl: 0   Allergies  Allergen Reactions  . Crestor [Rosuvastatin Calcium] Other (See Comments)    muscle aches  . Demerol  [Meperidine Hcl]     Other reaction(s): Hallucinations  . Shellfish Allergy Itching and Other (See  Comments)    Crab, shrimp and lobster ---lips itch and tingle Was told not to eat again after having a allergy test. Lobster, crab and shrimp      Review of Systems  Constitutional: Negative.   Eyes: Negative.  Negative for blurred vision.  Respiratory: Negative.  Negative for shortness of breath.   Cardiovascular: Negative.  Negative for chest pain, palpitations and orthopnea.  Endocrine: Negative for polydipsia, polyphagia and polyuria.  Musculoskeletal:        She c/o left leg pain. She has painp in left lower leg. States her 5th toe is tender as well.   Neurological: Negative.  Negative for dizziness, weakness and headaches.  Psychiatric/Behavioral: Negative.       Today's Vitals   09/22/22 1205  BP: (!) 150/90  Pulse: 75  Temp: 97.9 F (36.6 C)  Weight: 161 lb 12.8 oz (73.4 kg)  Height: 5\' 2"  (1.575 m)  PainSc: 7   PainLoc: Leg   Body mass index is 29.59 kg/m.  Wt Readings from Last 3 Encounters:  09/22/22 161 lb 12.8 oz (73.4 kg)  07/22/22 164 lb 6.4 oz (74.6 kg)  07/10/22 163 lb 3.2 oz (74 kg)     Objective:  Physical Exam Vitals and nursing note reviewed.  Constitutional:      Appearance: Normal appearance.  HENT:     Head: Normocephalic and atraumatic.  Cardiovascular:     Rate and Rhythm: Normal rate and regular rhythm.     Heart sounds: Normal heart sounds.  Pulmonary:     Effort: Pulmonary effort is normal.     Breath sounds: Normal breath sounds.  Musculoskeletal:     Cervical back: Normal range of motion.     Right lower leg: Edema present.     Left lower leg: Edema present.     Comments: Wearing compression hose Ambulatory w/ cane   Skin:    General: Skin is warm.  Neurological:     General: No focal deficit present.     Mental Status: She is alert.  Psychiatric:  Mood and Affect: Mood normal.        Behavior: Behavior normal.        Assessment And Plan:  Dyslipidemia associated with type 2 diabetes mellitus (HCC) -      CMP14+EGFR -     Hemoglobin A1c  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 heart failure (HCC)  Left leg pain -     CK     Return in 2 weeks (on 10/06/2022), or BP check and prevnar 20 (8/27), for controlled DM check-4 months.  Patient was given opportunity to ask questions. Patient verbalized understanding of the plan and was able to repeat key elements of the plan. All questions were answered to their satisfaction.   I, Sheila Aliment, MD, have reviewed all documentation for this visit. The documentation on 09/22/22 for the exam, diagnosis, procedures, and orders are all accurate and complete.   IF YOU HAVE BEEN REFERRED TO A SPECIALIST, IT MAY TAKE 1-2 WEEKS TO SCHEDULE/PROCESS THE REFERRAL. IF YOU HAVE NOT HEARD FROM US/SPECIALIST IN TWO WEEKS, PLEASE GIVE [place] A CALL AT [phone].   THE PATIENT IS ENCOURAGED TO PRACTICE SOCIAL DISTANCING DUE TO THE COVID-19 PANDEMIC.

## 2022-09-22 NOTE — Patient Instructions (Signed)

## 2022-10-05 DIAGNOSIS — M79675 Pain in left toe(s): Secondary | ICD-10-CM | POA: Insufficient documentation

## 2022-10-05 DIAGNOSIS — M79605 Pain in left leg: Secondary | ICD-10-CM | POA: Insufficient documentation

## 2022-10-05 DIAGNOSIS — I700 Atherosclerosis of aorta: Secondary | ICD-10-CM | POA: Insufficient documentation

## 2022-10-05 NOTE — Assessment & Plan Note (Signed)
Sx suggestive of myalgias. I will check CPK. She is encouraged to stay well hydrated. May also benefit from Mg supplementation.

## 2022-10-05 NOTE — Assessment & Plan Note (Signed)
Chronic, LDL goal < 70.  She will continue with atorvastatin 80mg  and Zetia 10mg  daily. She is encouraged to follow a heart healthy lifestyle.

## 2022-10-05 NOTE — Assessment & Plan Note (Signed)
Should consider applying Voltaren gel to affected area twice daily as needed. If persistent, will refer to Podiatry for further evaluation and radiographic studies.

## 2022-10-05 NOTE — Assessment & Plan Note (Signed)
Chronic, uncontrolled. She admits she has yet to take her meds today. Importance of medication compliance was discussed with the patient. Encouraged to follow low sodium diet. Pt advised I am unable to adjust meds if she is not taking the meds currently prescribed AS prescribed. She agrees to rto in two weeks for NV.

## 2022-10-07 ENCOUNTER — Ambulatory Visit: Payer: Medicare Other

## 2022-10-07 ENCOUNTER — Ambulatory Visit: Payer: Medicare Other | Admitting: Internal Medicine

## 2022-10-07 VITALS — BP 136/70 | HR 76 | Temp 98.5°F | Ht 62.0 in | Wt 161.0 lb

## 2022-10-07 DIAGNOSIS — Z23 Encounter for immunization: Secondary | ICD-10-CM

## 2022-10-07 NOTE — Progress Notes (Signed)
Patient presents today for a bpc and prevnar 20. Patient currently taking carvedilol 25mg , furosemide 40mg . BP Readings from Last 3 Encounters:  10/07/22 (!) 140/72  09/22/22 (!) 142/90  07/22/22 122/60  Per provider- goal is less than 120/80- restrict salt

## 2022-10-09 ENCOUNTER — Other Ambulatory Visit: Payer: Self-pay | Admitting: Cardiovascular Disease

## 2022-10-09 ENCOUNTER — Other Ambulatory Visit: Payer: Self-pay | Admitting: Internal Medicine

## 2022-11-07 ENCOUNTER — Emergency Department (HOSPITAL_COMMUNITY)
Admission: EM | Admit: 2022-11-07 | Discharge: 2022-11-07 | Disposition: A | Discharge status: Home / Self Care | Payer: Medicare Other | Attending: Emergency Medicine | Admitting: Emergency Medicine

## 2022-11-07 ENCOUNTER — Other Ambulatory Visit: Payer: Self-pay

## 2022-11-07 ENCOUNTER — Emergency Department (HOSPITAL_COMMUNITY): Payer: Medicare Other

## 2022-11-07 DIAGNOSIS — J45909 Unspecified asthma, uncomplicated: Secondary | ICD-10-CM | POA: Diagnosis not present

## 2022-11-07 DIAGNOSIS — R519 Headache, unspecified: Secondary | ICD-10-CM | POA: Diagnosis not present

## 2022-11-07 DIAGNOSIS — I2510 Atherosclerotic heart disease of native coronary artery without angina pectoris: Secondary | ICD-10-CM | POA: Insufficient documentation

## 2022-11-07 DIAGNOSIS — R9082 White matter disease, unspecified: Secondary | ICD-10-CM | POA: Diagnosis not present

## 2022-11-07 DIAGNOSIS — Z87891 Personal history of nicotine dependence: Secondary | ICD-10-CM | POA: Insufficient documentation

## 2022-11-07 DIAGNOSIS — R531 Weakness: Secondary | ICD-10-CM | POA: Diagnosis not present

## 2022-11-07 DIAGNOSIS — E119 Type 2 diabetes mellitus without complications: Secondary | ICD-10-CM | POA: Diagnosis not present

## 2022-11-07 DIAGNOSIS — R197 Diarrhea, unspecified: Secondary | ICD-10-CM | POA: Insufficient documentation

## 2022-11-07 DIAGNOSIS — I10 Essential (primary) hypertension: Secondary | ICD-10-CM | POA: Insufficient documentation

## 2022-11-07 DIAGNOSIS — E860 Dehydration: Secondary | ICD-10-CM | POA: Diagnosis not present

## 2022-11-07 LAB — CBC WITH DIFFERENTIAL/PLATELET
Abs Immature Granulocytes: 0.04 K/uL (ref 0.00–0.07)
Basophils Absolute: 0 K/uL (ref 0.0–0.1)
Basophils Relative: 1 %
Eosinophils Absolute: 0.3 K/uL (ref 0.0–0.5)
Eosinophils Relative: 3 %
HCT: 35.6 % — ABNORMAL LOW (ref 36.0–46.0)
Hemoglobin: 11.4 g/dL — ABNORMAL LOW (ref 12.0–15.0)
Immature Granulocytes: 1 %
Lymphocytes Relative: 15 %
Lymphs Abs: 1.3 K/uL (ref 0.7–4.0)
MCH: 27.3 pg (ref 26.0–34.0)
MCHC: 32 g/dL (ref 30.0–36.0)
MCV: 85.4 fL (ref 80.0–100.0)
Monocytes Absolute: 0.7 K/uL (ref 0.1–1.0)
Monocytes Relative: 8 %
Neutro Abs: 6.3 K/uL (ref 1.7–7.7)
Neutrophils Relative %: 72 %
Platelets: 192 K/uL (ref 150–400)
RBC: 4.17 MIL/uL (ref 3.87–5.11)
RDW: 13.8 % (ref 11.5–15.5)
WBC: 8.6 K/uL (ref 4.0–10.5)
nRBC: 0 % (ref 0.0–0.2)

## 2022-11-07 LAB — COMPREHENSIVE METABOLIC PANEL WITH GFR
ALT: 15 U/L (ref 0–44)
AST: 18 U/L (ref 15–41)
Albumin: 4 g/dL (ref 3.5–5.0)
Alkaline Phosphatase: 71 U/L (ref 38–126)
Anion gap: 15 (ref 5–15)
BUN: 16 mg/dL (ref 8–23)
CO2: 25 mmol/L (ref 22–32)
Calcium: 9.2 mg/dL (ref 8.9–10.3)
Chloride: 97 mmol/L — ABNORMAL LOW (ref 98–111)
Creatinine, Ser: 1.11 mg/dL — ABNORMAL HIGH (ref 0.44–1.00)
GFR, Estimated: 52 mL/min — ABNORMAL LOW
Glucose, Bld: 136 mg/dL — ABNORMAL HIGH (ref 70–99)
Potassium: 4.1 mmol/L (ref 3.5–5.1)
Sodium: 137 mmol/L (ref 135–145)
Total Bilirubin: 0.6 mg/dL (ref 0.3–1.2)
Total Protein: 7 g/dL (ref 6.5–8.1)

## 2022-11-07 MED ORDER — LOPERAMIDE HCL 2 MG PO CAPS: 2.0000 mg | ORAL_CAPSULE | Freq: Four times a day (QID) | ORAL | 0 refills | Status: DC | PRN

## 2022-11-07 MED ORDER — ONDANSETRON 4 MG PO TBDP: 4.0000 mg | ORAL_TABLET | Freq: Three times a day (TID) | ORAL | 0 refills | Status: DC | PRN

## 2022-11-07 MED ORDER — SODIUM CHLORIDE 0.9 % IV BOLUS
1000.0000 mL | Freq: Once | INTRAVENOUS | Status: AC
  Administered 2022-11-07: 1000 mL via INTRAVENOUS

## 2022-11-07 NOTE — ED Provider Notes (Signed)
[facility] Emergency Department Provider Note MRN:  [id number]  Arrival date & time: 11/07/22     Chief Complaint   Diarrhea/Emesis   History of Present Illness   Sheila Reynolds is a 77 y.o. year-old female with a history of CAD presenting to the ED with chief complaint of diarrhea.  3 days of diarrhea.  Some nausea this evening as well.  Had an episode where she felt very dizzy, could not get off of the toilet, spaced out for a minute.  Did not fall or pass out.  Review of Systems  A thorough review of systems was obtained and all systems are negative except as noted in the HPI and PMH.   Patient's Health History    Past Medical History:  Diagnosis Date   Anemia    3 months ago anemic   Anxiety    on meds   Arthritis    "all over" (01/15/2017)   Asthma    Bronchitis with emphysema    Chest pain    Chronic bronchitis (HCC)    Coronary artery disease    a. 01/2017 she underwent orbital atherectomy/DES to the proxmal LAD and PTCA to ostial D2. 2D Echo 01/15/17 showed mild LVH, EF 60-65%, grade 1 DD.   Family history of anesthesia complication    daughter N/V   Fibromyalgia    GERD (gastroesophageal reflux disease)    on meds   Glaucoma    Heart murmur    History of hiatal hernia    Hx of echocardiogram    Echo (03/2013):  Tech limited; Mild focal basal septal hypertrophy, EF 60-65%, normal RVF   Hyperlipidemia    Hypertension    Nonsustained ventricular tachycardia (HCC)    OSA on CPAP    Premature atrial contractions    PVC (premature ventricular contraction)    a. Holter 12/16: NSR, occ PAC,PVCs   Sarcoidosis    Type II diabetes mellitus (HCC)     Past Surgical History:  Procedure Laterality Date   BREAST SURGERY     CARDIAC CATHETERIZATION  05/04/2007   reveals overall normal left ventricular systolic function. Ejection fraction 65-70%   CATARACT EXTRACTION W/PHACO Right 07/20/2013   Procedure: CATARACT EXTRACTION PHACO AND  INTRAOCULAR LENS PLACEMENT (IOC);  Surgeon: Chalmers Guest, MD;  Location: [facility] OR;  Service: Ophthalmology;  Laterality: Right;   COLONOSCOPY W/ BIOPSIES AND POLYPECTOMY     COLONOSCOPY WITH PROPOFOL N/A 09/07/2014   Procedure: COLONOSCOPY WITH PROPOFOL;  Surgeon: Charna Elizabeth, MD;  Location: [facility] ENDOSCOPY;  Service: Endoscopy;  Laterality: N/A;   COLONOSCOPY WITH PROPOFOL N/A 01/10/2020   Procedure: COLONOSCOPY WITH PROPOFOL;  Surgeon: Charna Elizabeth, MD;  Location: [facility]OS[facility];  Service: Endoscopy;  Laterality: N/A;   CORONARY ANGIOPLASTY WITH STENT PLACEMENT  01/15/2017   CORONARY ATHERECTOMY N/A 01/15/2017   Procedure: CORONARY ATHERECTOMY;  Surgeon: Yvonne Kendall, MD;  [facility];  Service: Cardiovascular;  Laterality: N/A;   CORONARY BALLOON ANGIOPLASTY N/A 01/15/2017   Procedure: CORONARY BALLOON ANGIOPLASTY;  Surgeon: Yvonne Kendall, MD;  [facility];  Service: Cardiovascular;  Laterality: N/A;   CORONARY PRESSURE/FFR STUDY N/A 12/12/2016   Procedure: INTRAVASCULAR PRESSURE WIRE/FFR STUDY;  Surgeon: Yvonne Kendall, MD;  [facility];  Service: Cardiovascular;  Laterality: N/A;   CORONARY STENT INTERVENTION N/A 01/15/2017   Procedure: CORONARY STENT INTERVENTION;  Surgeon: Yvonne Kendall, MD;  [facility];  Service: Cardiovascular;  Laterality: N/A;  ESOPHAGOGASTRODUODENOSCOPY (EGD) WITH PROPOFOL N/A 09/07/2014   Procedure: ESOPHAGOGASTRODUODENOSCOPY (EGD) WITH PROPOFOL;  Surgeon: Charna Elizabeth, MD;  Location: [facility];  Service: Endoscopy;  Laterality: N/A;   EXTERNAL EAR SURGERY Bilateral 1970s   tumors removed   EYE SURGERY Left 2019   cataract extraction    EYE SURGERY Right 02/09/2018   eyelid surgery    MINI SHUNT INSERTION Right 07/20/2013   Procedure: INSERTION OF GLAUCOMA FILTRATION DEVICE RIGHT EYE;  Surgeon: Chalmers Guest, MD;  Location: [facility] OR;  Service: Ophthalmology;  Laterality: Right;   MITOMYCIN C  APPLICATION Right 07/20/2013   Procedure: MITOMYCIN C APPLICATION;  Surgeon: Chalmers Guest, MD;  Location: [facility] OR;  Service: Ophthalmology;  Laterality: Right;   MITOMYCIN C APPLICATION Right 02/21/2015   Procedure: MITOMYCIN C APPLICATION RIGHT EYE;  Surgeon: Chalmers Guest, MD;  Location: [facility] OR;  Service: Ophthalmology;  Laterality: Right;   PLACEMENT OF BREAST IMPLANTS Bilateral 1992   "took all my breast tissue out; put implants in;fibrocystic breast disease "   POLYPECTOMY  01/10/2020   Procedure: POLYPECTOMY;  Surgeon: Charna Elizabeth, MD;  Location: [facility];  Service: Endoscopy;;   RIGHT/LEFT HEART CATH AND CORONARY ANGIOGRAPHY N/A 12/12/2016   Procedure: RIGHT/LEFT HEART CATH AND CORONARY ANGIOGRAPHY;  Surgeon: Yvonne Kendall, MD;  [facility];  Service: Cardiovascular;  Laterality: N/A;   TONSILLECTOMY     TOTAL ABDOMINAL HYSTERECTOMY  1982   TRABECULECTOMY Right 02/21/2015   Procedure: TRABECULECTOMY WITH [facility] ON THE RIGHT EYE;  Surgeon: Chalmers Guest, MD;  Location: [facility] OR;  Service: Ophthalmology;  Laterality: Right;    Family History  Problem Relation Age of Onset   Heart disease Father    Diabetes Father    Glaucoma Father    Cancer Mother        unknown type, ?lung   Heart disease Paternal Grandmother    Cancer Paternal Grandmother        unknown type   Diabetes Brother    Glaucoma Brother     Social History   Socioeconomic History   Marital status: Married    Spouse name: Thayer Ohm   Number of children: 2   Years of education: Not on file   Highest education level: Not on file  Occupational History   Occupation: unemployed    Employer: [employer]  Tobacco Use   Smoking status: Former    Current packs/day: 0.00    Average packs/day: 1 pack/day for 4.0 years (4.0 ttl pk-yrs)    Types: Cigarettes    Start date: 02/11/1976    Quit date: 02/11/1980    Years since quitting: 42.7   Smokeless tobacco: Never  Vaping Use   Vaping status: Never  Used  Substance and Sexual Activity   Alcohol use: No   Drug use: No   Sexual activity: Yes  Other Topics Concern   Not on file  Social History Narrative   Lives with husband   Right hand   Drinks 2-3 cups caffeine daily   Social Determinants of Health   Financial Resource Strain: Low Risk  (04/30/2022)   Overall Financial Resource Strain (CARDIA)    Difficulty of Paying Living Expenses: Not hard at all  Food Insecurity: No Food Insecurity (04/30/2022)   Hunger Vital Sign    Worried About Running Out of Food in the Last Year: Never true    Ran Out of Food in the Last Year: Never true  Transportation Needs: No Transportation Needs (04/30/2022)   PRAPARE -  Administrator, Civil Service (Medical): No    Lack of Transportation (Non-Medical): No  Physical Activity: Inactive (04/30/2022)   Exercise Vital Sign    Days of Exercise per Week: 0 days    Minutes of Exercise per Session: 0 min  Stress: Stress Concern Present (04/30/2022)   [employer] of Occupational Health - Occupational Stress Questionnaire    Feeling of Stress : To some extent  Social Connections: Unknown (11/04/2021)   Received from [facility]   Social Network    Social Network: Not on file  Intimate Partner Violence: Unknown (11/04/2021)   Received from [facility], [facility]   HITS    Physically Hurt: Not on file    Insult or Talk Down To: Not on file    Threaten Physical Harm: Not on file    Scream or Curse: Not on file     Physical Exam   Vitals:   11/07/22 0222 11/07/22 0525  BP: 120/66 (!) 147/80  Pulse: 74 75  Resp: 18 18  Temp: 97.7 F (36.5 C) 97.7 F (36.5 C)  SpO2: 99% 100%    CONSTITUTIONAL: Well-appearing, NAD NEURO/PSYCH:  Alert and oriented x 3, normal and symmetric strength and sensation, normal coordination, normal speech EYES:  eyes equal and reactive ENT/NECK:  no LAD, no JVD CARDIO: Regular rate, well-perfused, normal S1 and S2 PULM:  CTAB no  wheezing or rhonchi GI/GU:  non-distended, non-tender MSK/SPINE:  No gross deformities, no edema SKIN:  no rash, atraumatic   *Additional and/or pertinent findings included in MDM below  Diagnostic and Interventional Summary    EKG Interpretation Date/Time:  Friday November 07 2022 05:37:21 EDT Ventricular Rate:  75 PR Interval:  157 QRS Duration:  79 QT Interval:  411 QTC Calculation: 460 R Axis:   38  Text Interpretation: Sinus rhythm Confirmed by Kennis Carina [phone]) on 11/07/2022 6:54:00 AM       Labs Reviewed  CBC WITH DIFFERENTIAL/PLATELET - Abnormal; Notable for the following components:      Result Value   Hemoglobin 11.4 (*)    HCT 35.6 (*)    All other components within normal limits  COMPREHENSIVE METABOLIC PANEL - Abnormal; Notable for the following components:   Chloride 97 (*)    Glucose, Bld 136 (*)    Creatinine, Ser 1.11 (*)    GFR, Estimated 52 (*)    All other components within normal limits    CT HEAD WO CONTRAST ( )  Final Result      Medications  sodium chloride 0.9 % bolus 1,000 mL (1,000 mLs Intravenous New Bag/Given 11/07/22 0622)     Procedures  /  Critical Care Procedures  ED Course and Medical Decision Making  Initial Impression and Ddx Suspect gastroenteritis with dehydration causing the spell today where she felt very lightheaded and dizzy and could not get off of the commode.  Very reassuring neurological exam, vital signs.  Abdomen is completely soft and nontender with no rebound guarding or rigidity, no indication for advanced imaging of the abdomen at this time.  Has been concerned because the last time she had 1 of these spells they were told that she had a mini stroke.  I see no real evidence of this in the chart review.  Husband explains that one of her eyes seem to be deviated at the time.  Will obtain screening CT head.  Providing fluids and will reassess.  Past medical/surgical history that increases complexity of ED  encounter: None  Interpretation of Diagnostics I personally reviewed the EKG and my interpretation is as follows: Sinus rhythm  Labs reassuring with no significant blood count or electrolyte disturbance, mild elevation in creatinine supporting the safety of dehydration  Patient Reassessment and Ultimate Disposition/Management     Patient feels well on reassessment, continued normal vital signs, CT head normal.  Appropriate for discharge.  Patient management required discussion with the following services or consulting groups:  None  Complexity of Problems Addressed Acute illness or injury that poses threat of life of bodily function  Additional Data Reviewed and Analyzed Further history obtained from: Further history from spouse/family member  Additional Factors Impacting ED Encounter Risk Prescriptions  Elmer Sow. Pilar Plate, MD [facility] Emergency Medicine [facility] [email or web address]  Final Clinical Impressions(s) / ED Diagnoses     ICD-10-CM   1. Diarrhea of presumed infectious origin  R19.7     2. Dehydration  E86.0       ED Discharge Orders          Ordered    loperamide (IMODIUM) 2 MG capsule  4 times daily PRN        11/07/22 0706    ondansetron (ZOFRAN-ODT) 4 MG disintegrating tablet  Every 8 hours PRN        11/07/22 0706             Discharge Instructions Discussed with and Provided to Patient:     Discharge Instructions      You were evaluated in the Emergency Department and after careful evaluation, we did not find any emergent condition requiring admission or further testing in the hospital.  Your exam/testing today is overall reassuring.  Symptoms likely due to a viral illness causing diarrhea.  Suspect you are also dehydrated.  Continue plenty of fluids at home.  Use the Zofran as needed for nausea, use the Imodium as needed for diarrhea.  Please return to the Emergency Department if you experience any worsening of your  condition.   Thank you for allowing [place] to be a part of your care.       Sabas Sous, MD 11/07/22 [phone]

## 2022-11-07 NOTE — ED Triage Notes (Signed)
Patient arrived with EMS from home reports diarrhea for 2 days and emesis this evening , denies abdominal pain . No fever or chills .

## 2022-11-07 NOTE — Discharge Instructions (Signed)
You were evaluated in the Emergency Department and after careful evaluation, we did not find any emergent condition requiring admission or further testing in the hospital.  Your exam/testing today is overall reassuring.  Symptoms likely due to a viral illness causing diarrhea.  Suspect you are also dehydrated.  Continue plenty of fluids at home.  Use the Zofran as needed for nausea, use the Imodium as needed for diarrhea.  Please return to the Emergency Department if you experience any worsening of your condition.   Thank you for allowing [place] to be a part of your care.

## 2022-11-07 NOTE — ED Notes (Signed)
Assumed care of pt. IVF infusing. Will discharge home once completed.

## 2022-11-07 NOTE — ED Notes (Signed)
Patient at triage 13.

## 2022-11-22 NOTE — Progress Notes (Signed)
HPI  F former smoker followed for bronchitis, hx  Occular sarcoid, bronchitis/ nodules, OSA complicated by GERD, glaucoma, DM2 NPSG 12/25/03- AHI  2.9/ hr, desaturation to 91%, body weight 164 lbs NPSG-10/18/13- Mild OSA, AHI 9.3/ hr, weight 164 lbs  ACE 09/17/15-47-WNL ACE level 08/18/16-30 ACE level 05/14/20- 22 PFT: 01/07/2011-FEV1 1.59/79%, FEV1/FVC 0.73, FEF 25-75% improved to 38% with bronchodilator. TLC 90% DLCO 55% Office Spirometry 06/09/16- moderate restriction of exhaled volume. FVC 1.51/68%, FEV1 1.20/70%, ratio 0.79, FEF 25-75% 1.19/73% Nuclear Stress Test 09/04/16- EF 65%, low risk. ECHO 07/31/16- Gr 1 DD Walk test on room air 12/21/2017-minimal saturation 94%, maximum heart rate 118/minute with no stops walking 3 laps x180 m. Office Spirometry 12/21/17 PFT 04/13/20- Minimal Obstruction and Minimal Restriction, no resp to BD, Moderately severe DLCO defect WALK TEST 4/422- 2 laps, min O2 sat 94%, max HR 127.  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 CPAP  Luna- 5-15 / [employer]    erol, Ventolin hfa, Anoro,  Pulmonary Rehab- done               ED for Covid 01/30/22> benzonatate Saw Dr Isaiah Serge fo replacement [id number] Download-compliance Body weight today-164 lbs ------Complains of productive cough with clear phlegm-states it had been green/yellowish, chest congestion, wheezing,some sob Symptoms started over a month ago. PCP rx'd augmentin-  temporary help. We will contact her DME for CPAP download.  She indicates routine use. Active bronchitis symptoms over the past month, shared with whole family.  She continues azathioprine from her rheumatologist. Note that she wears elastic hose routinely but could only pull on the left 1 today and could not get the right one on without assistance.  She says her cardiologist does not think the peripheral edema is cardiogenic. We discussed her last CT noting to new nodules and discussed acute symptoms superimposed on her chronic more stable UIP pattern. We will update chest CT to reassess the nodules.  We will seek to culture sputum before starting doxycycline.  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 Pulmonary Rehab- done               ED for Covid 01/30/22> benzonatate CPAP  Luna- 5-15 / [employer]    Saw Dr Isaiah Serge for replacement [id number] Download compliance- CPAP used 2-3 days ago. Body weight today-166 lbs Not driving for 6 months after possible seizure, Sputum cultures from June all nl flora/ yeast, neg AFB. Took doxy after these cultures obtained. Had Flu vax Still some night sweats, productive cough, DOE but not worse. She is aware memory not good- forgets CPAP- discussed. Had flu vax. CT chest 07/25/22- IMPRESSION: 1. New irregular solid nodule of the right lung apex, dominant nodule described on prior exam has resolved, and  new clustered small solid nodules are seen in the posterior right upper lobe. Findings are likely due to chronic atypical infection. Recommend continued follow-up based on patient risk  factors. 2. 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 Am Rosezetta Schlatter Crit Care Med Vol 198, Iss 5, [id number], Oct 11 2016. 3. Aortic Atherosclerosis (ICD10-I70.0) and Emphysema (ICD10-J43.9).   ROS-see HPI     + = positive Constitutional:   No-   weight loss, night sweats, fevers, chills, fatigue, lassitude. HEENT:   +  headaches, difficulty swallowing, tooth/dental problems, sore throat,       Some  sneezing, itching, ear ache, +nasal congestion, post nasal drip,  CV: + chest pain, no-orthopnea, PND, +swelling in lower extremities, anasarca, dizziness, palpitations Resp: +  shortness of breath with exertion not at rest.            +productive cough,  + non-productive cough,  No- coughing up of blood.              + color of mucus.   wheezing.   Skin: No-   rash or lesions. GI:  +  heartburn, indigestion, abdominal pain, nausea, vomiting,  GU:  MS:  +  joint pain or swelling, + back pain Neuro-     nothing unusual Psych:  No- change in mood or affect. No depression or anxiety.  No memory loss.  OBJ  Afebrile General- Alert, Oriented, Affect tearful, Distress- none acute,  Skin- rash-none, lesions- none, excoriation- none Lymphadenopathy- none Head- atraumatic            Eyes- Gross vision intact, PERRLA, conjunctivae clear secretions- not injected            Ears- Hearing aid             Nose-  turbinate edema, no-Septal dev, mucus, polyps, erosion, perforation             Throat- Mallampati III , mucosa  , drainage- none, tonsils- atrophic Neck- flexible , trachea midline, no stridor , thyroid nl, carotid no bruit Chest - symmetrical excursion , unlabored           Heart/CV- RRR , no murmur , no gallop  , no rub, nl s1 s2                           - JVD- none , edema+3, stasis changes- none,  varices- none           Lung-  crackles+faint, Cough-none, wheeze-none, rhonchi-none , dullness-none, rub- none           Chest wall-  Abd- No HSM Br/ Gen/ Rectal- Not done, not indicated Extrem- cyanosis- none, clubbing, none, atrophy- none, strength- nl. +elastic hose, +cane Neuro- grossly intact to observation

## 2022-11-24 ENCOUNTER — Ambulatory Visit: Payer: Medicare Other | Admitting: Internal Medicine

## 2022-11-24 ENCOUNTER — Encounter: Payer: Self-pay | Admitting: Internal Medicine

## 2022-11-24 VITALS — BP 127/85 | HR 74 | Ht 62.0 in | Wt 166.0 lb

## 2022-11-24 DIAGNOSIS — G4733 Obstructive sleep apnea (adult) (pediatric): Secondary | ICD-10-CM

## 2022-11-24 DIAGNOSIS — Z23 Encounter for immunization: Secondary | ICD-10-CM | POA: Diagnosis not present

## 2022-11-24 DIAGNOSIS — R918 Other nonspecific abnormal finding of lung field: Secondary | ICD-10-CM

## 2022-11-24 NOTE — Patient Instructions (Signed)
Order- schedule future CT chest no contrast- in December  dx lung nodules  Try to remember to use your CPAP every night if you can.

## 2022-12-02 ENCOUNTER — Emergency Department (HOSPITAL_BASED_OUTPATIENT_CLINIC_OR_DEPARTMENT_OTHER): Payer: Medicare Other

## 2022-12-02 ENCOUNTER — Encounter (HOSPITAL_BASED_OUTPATIENT_CLINIC_OR_DEPARTMENT_OTHER): Payer: Self-pay | Admitting: Emergency Medicine

## 2022-12-02 ENCOUNTER — Inpatient Hospital Stay (HOSPITAL_BASED_OUTPATIENT_CLINIC_OR_DEPARTMENT_OTHER)
Admission: EM | Admit: 2022-12-02 | Discharge: 2022-12-06 | DRG: 194 | Disposition: A | Discharge status: Home / Self Care | Payer: Medicare Other | Attending: Internal Medicine | Admitting: Internal Medicine

## 2022-12-02 DIAGNOSIS — R911 Solitary pulmonary nodule: Secondary | ICD-10-CM | POA: Diagnosis present

## 2022-12-02 DIAGNOSIS — M159 Polyosteoarthritis, unspecified: Secondary | ICD-10-CM | POA: Diagnosis present

## 2022-12-02 DIAGNOSIS — J189 Pneumonia, unspecified organism: Secondary | ICD-10-CM

## 2022-12-02 DIAGNOSIS — R519 Headache, unspecified: Secondary | ICD-10-CM | POA: Diagnosis not present

## 2022-12-02 DIAGNOSIS — N390 Urinary tract infection, site not specified: Secondary | ICD-10-CM | POA: Diagnosis not present

## 2022-12-02 DIAGNOSIS — Z888 Allergy status to other drugs, medicaments and biological substances status: Secondary | ICD-10-CM

## 2022-12-02 DIAGNOSIS — E669 Obesity, unspecified: Secondary | ICD-10-CM | POA: Diagnosis present

## 2022-12-02 DIAGNOSIS — R55 Syncope and collapse: Secondary | ICD-10-CM | POA: Diagnosis not present

## 2022-12-02 DIAGNOSIS — J439 Emphysema, unspecified: Secondary | ICD-10-CM | POA: Diagnosis not present

## 2022-12-02 DIAGNOSIS — Z7982 Long term (current) use of aspirin: Secondary | ICD-10-CM

## 2022-12-02 DIAGNOSIS — J188 Other pneumonia, unspecified organism: Secondary | ICD-10-CM | POA: Diagnosis not present

## 2022-12-02 DIAGNOSIS — Z6831 Body mass index (BMI) 31.0-31.9, adult: Secondary | ICD-10-CM | POA: Diagnosis not present

## 2022-12-02 DIAGNOSIS — Z6830 Body mass index (BMI) 30.0-30.9, adult: Secondary | ICD-10-CM

## 2022-12-02 DIAGNOSIS — Z90710 Acquired absence of both cervix and uterus: Secondary | ICD-10-CM

## 2022-12-02 DIAGNOSIS — I5032 Chronic diastolic (congestive) heart failure: Secondary | ICD-10-CM | POA: Diagnosis present

## 2022-12-02 DIAGNOSIS — M25551 Pain in right hip: Secondary | ICD-10-CM | POA: Diagnosis not present

## 2022-12-02 DIAGNOSIS — Z83511 Family history of glaucoma: Secondary | ICD-10-CM

## 2022-12-02 DIAGNOSIS — I517 Cardiomegaly: Secondary | ICD-10-CM | POA: Diagnosis not present

## 2022-12-02 DIAGNOSIS — G4733 Obstructive sleep apnea (adult) (pediatric): Secondary | ICD-10-CM | POA: Diagnosis present

## 2022-12-02 DIAGNOSIS — R918 Other nonspecific abnormal finding of lung field: Secondary | ICD-10-CM | POA: Diagnosis not present

## 2022-12-02 DIAGNOSIS — H409 Unspecified glaucoma: Secondary | ICD-10-CM | POA: Diagnosis present

## 2022-12-02 DIAGNOSIS — Z833 Family history of diabetes mellitus: Secondary | ICD-10-CM

## 2022-12-02 DIAGNOSIS — Z91013 Allergy to seafood: Secondary | ICD-10-CM

## 2022-12-02 DIAGNOSIS — I2510 Atherosclerotic heart disease of native coronary artery without angina pectoris: Secondary | ICD-10-CM | POA: Diagnosis present

## 2022-12-02 DIAGNOSIS — J440 Chronic obstructive pulmonary disease with acute lower respiratory infection: Secondary | ICD-10-CM | POA: Diagnosis present

## 2022-12-02 DIAGNOSIS — J9811 Atelectasis: Secondary | ICD-10-CM | POA: Diagnosis not present

## 2022-12-02 DIAGNOSIS — E785 Hyperlipidemia, unspecified: Secondary | ICD-10-CM | POA: Insufficient documentation

## 2022-12-02 DIAGNOSIS — Z7902 Long term (current) use of antithrombotics/antiplatelets: Secondary | ICD-10-CM

## 2022-12-02 DIAGNOSIS — Z955 Presence of coronary angioplasty implant and graft: Secondary | ICD-10-CM

## 2022-12-02 DIAGNOSIS — D869 Sarcoidosis, unspecified: Secondary | ICD-10-CM | POA: Diagnosis present

## 2022-12-02 DIAGNOSIS — R0989 Other specified symptoms and signs involving the circulatory and respiratory systems: Secondary | ICD-10-CM | POA: Diagnosis not present

## 2022-12-02 DIAGNOSIS — G8929 Other chronic pain: Secondary | ICD-10-CM | POA: Diagnosis present

## 2022-12-02 DIAGNOSIS — J449 Chronic obstructive pulmonary disease, unspecified: Secondary | ICD-10-CM | POA: Diagnosis not present

## 2022-12-02 DIAGNOSIS — M4804 Spinal stenosis, thoracic region: Secondary | ICD-10-CM | POA: Diagnosis not present

## 2022-12-02 DIAGNOSIS — M797 Fibromyalgia: Secondary | ICD-10-CM | POA: Diagnosis present

## 2022-12-02 DIAGNOSIS — K219 Gastro-esophageal reflux disease without esophagitis: Secondary | ICD-10-CM | POA: Diagnosis present

## 2022-12-02 DIAGNOSIS — S0990XA Unspecified injury of head, initial encounter: Secondary | ICD-10-CM | POA: Diagnosis not present

## 2022-12-02 DIAGNOSIS — Z87891 Personal history of nicotine dependence: Secondary | ICD-10-CM | POA: Diagnosis not present

## 2022-12-02 DIAGNOSIS — M47814 Spondylosis without myelopathy or radiculopathy, thoracic region: Secondary | ICD-10-CM | POA: Diagnosis not present

## 2022-12-02 DIAGNOSIS — J984 Other disorders of lung: Secondary | ICD-10-CM

## 2022-12-02 DIAGNOSIS — Z91199 Patient's noncompliance with other medical treatment and regimen due to unspecified reason: Secondary | ICD-10-CM

## 2022-12-02 DIAGNOSIS — M160 Bilateral primary osteoarthritis of hip: Secondary | ICD-10-CM | POA: Diagnosis not present

## 2022-12-02 DIAGNOSIS — Z9882 Breast implant status: Secondary | ICD-10-CM

## 2022-12-02 DIAGNOSIS — G35 Multiple sclerosis: Secondary | ICD-10-CM | POA: Diagnosis not present

## 2022-12-02 DIAGNOSIS — Z7984 Long term (current) use of oral hypoglycemic drugs: Secondary | ICD-10-CM

## 2022-12-02 DIAGNOSIS — S199XXA Unspecified injury of neck, initial encounter: Secondary | ICD-10-CM | POA: Diagnosis not present

## 2022-12-02 DIAGNOSIS — J479 Bronchiectasis, uncomplicated: Secondary | ICD-10-CM | POA: Diagnosis not present

## 2022-12-02 DIAGNOSIS — Z8249 Family history of ischemic heart disease and other diseases of the circulatory system: Secondary | ICD-10-CM

## 2022-12-02 DIAGNOSIS — I110 Hypertensive heart disease with heart failure: Secondary | ICD-10-CM | POA: Diagnosis present

## 2022-12-02 DIAGNOSIS — E119 Type 2 diabetes mellitus without complications: Secondary | ICD-10-CM | POA: Diagnosis present

## 2022-12-02 DIAGNOSIS — Z79899 Other long term (current) drug therapy: Secondary | ICD-10-CM

## 2022-12-02 DIAGNOSIS — R7989 Other specified abnormal findings of blood chemistry: Secondary | ICD-10-CM | POA: Diagnosis not present

## 2022-12-02 DIAGNOSIS — J84112 Idiopathic pulmonary fibrosis: Secondary | ICD-10-CM | POA: Diagnosis present

## 2022-12-02 DIAGNOSIS — J849 Interstitial pulmonary disease, unspecified: Secondary | ICD-10-CM | POA: Diagnosis not present

## 2022-12-02 DIAGNOSIS — Z043 Encounter for examination and observation following other accident: Secondary | ICD-10-CM | POA: Diagnosis not present

## 2022-12-02 DIAGNOSIS — Z885 Allergy status to narcotic agent status: Secondary | ICD-10-CM

## 2022-12-02 LAB — CBC
HCT: 34.6 % — ABNORMAL LOW (ref 36.0–46.0)
Hemoglobin: 11.6 g/dL — ABNORMAL LOW (ref 12.0–15.0)
MCH: 27.9 pg (ref 26.0–34.0)
MCHC: 33.5 g/dL (ref 30.0–36.0)
MCV: 83.2 fL (ref 80.0–100.0)
Platelets: 205 10*3/uL (ref 150–400)
RBC: 4.16 MIL/uL (ref 3.87–5.11)
RDW: 13.7 % (ref 11.5–15.5)
WBC: 8.7 10*3/uL (ref 4.0–10.5)
nRBC: 0 % (ref 0.0–0.2)

## 2022-12-02 LAB — BRAIN NATRIURETIC PEPTIDE: B Natriuretic Peptide: 52.3 pg/mL (ref 0.0–100.0)

## 2022-12-02 LAB — BASIC METABOLIC PANEL
Anion gap: 9 (ref 5–15)
BUN: 18 mg/dL (ref 8–23)
CO2: 26 mmol/L (ref 22–32)
Calcium: 9.1 mg/dL (ref 8.9–10.3)
Chloride: 102 mmol/L (ref 98–111)
Creatinine, Ser: 1.06 mg/dL — ABNORMAL HIGH (ref 0.44–1.00)
GFR, Estimated: 55 mL/min — ABNORMAL LOW (ref >60)
Glucose, Bld: 118 mg/dL — ABNORMAL HIGH (ref 70–99)
Potassium: 3.9 mmol/L (ref 3.5–5.1)
Sodium: 137 mmol/L (ref 135–145)

## 2022-12-02 LAB — D-DIMER, QUANTITATIVE: D-Dimer, Quant: 4.25 ug{FEU}/mL — ABNORMAL HIGH (ref 0.00–0.50)

## 2022-12-02 LAB — CBG MONITORING, ED: Glucose-Capillary: 115 mg/dL — ABNORMAL HIGH (ref 70–99)

## 2022-12-02 NOTE — ED Provider Notes (Incomplete)
[place] EMERGENCY DEPARTMENT AT [facility] Provider Note   CSN: [id number] Arrival date & time: 12/02/22  2109     History  No chief complaint on file.   Sheila Reynolds is a 77 y.o. female.  HPI   77 year old female with medical history significant for HLD, nonsustained ventricular tachycardia, CAD on Plavix, CHF (last EF 60% with diastolic dysfunction 2023), DM2 who presents to the emergency department with head trauma and a syncopal episode.  The patient states that she was eating dinner earlier today when she felt lightheaded and then suddenly passed out.  She does not remember if she was seated or standing as she sometimes eats while standing.  She came to on the ground underneath her table and had struck the back of her head.  Unclear duration of loss of consciousness.  No tongue biting or urinary incontinence.  She endorses a headache, was ambulatory after the event, last took Plavix this morning, endorses some chronic/subacute right hip pain, does endorse some mid back pain.  She arrives GCS 15, ABC intact.  He had no chest pain or shortness of breath prior to the episode, no heart palpitations.  She does have a history of nonsustained V. tach.  Home Medications Prior to Admission medications   Medication Sig Start Date End Date Taking? Authorizing Provider  acetaminophen (TYLENOL) 500 MG tablet Take 1,000 mg by mouth 2 (two) times daily as needed for moderate pain or headache.    [provider]  albuterol (PROVENTIL) (2.5 MG/3ML) 0.083% nebulizer solution Take 3 mLs (2.5 mg total) by nebulization daily as needed for wheezing or shortness of breath. 04/01/21   Arnette Felts, FNP  albuterol (VENTOLIN HFA) 108 (90 Base) MCG/ACT inhaler Inhale 2 puffs into the lungs every 6 (six) hours as needed for wheezing or shortness of breath. 12/30/19   Waymon Budge, MD  amoxicillin-clavulanate (AUGMENTIN) 875-125 MG tablet Take 1 tablet by mouth 2 (two) times  daily. Patient not taking: Reported on 11/24/2022 08/05/22   Waymon Budge, MD  [facility] ELLIPTA 62.5-25 MCG/ACT AEPB INHALE 1 PUFF INTO THE LUNGS DAILY AT 6 AM 02/21/22   Chilton Greathouse, MD  aspirin EC 81 MG tablet Take 81 mg by mouth daily. Swallow whole.    [provider]  atorvastatin (LIPITOR) 80 MG tablet Take 1 tablet (80 mg total) by mouth daily. 08/23/21   [name], Deloris Ping, MD  azaTHIOprine (IMURAN) 50 MG tablet Take by mouth. Patient not taking: Reported on 11/24/2022 11/21/21   [provider]  Blood Glucose Monitoring Suppl (ACCU-CHEK AVIVA PLUS) w/Device KIT Use to check blood sugars 3 times a day. Dx code e11.65 01/03/20   Dorothyann Peng, MD  brimonidine (ALPHAGAN P) 0.1 % SOLN Place 1 drop into both eyes 2 (two) times daily.     [provider]  brinzolamide (AZOPT) 1 % ophthalmic suspension Place 1 drop into both eyes 3 (three) times daily.    [provider]  carvedilol (COREG) 25 MG tablet Take 1 tablet (25 mg total) by mouth 2 (two) times daily. 01/01/22   Corky Crafts, MD  cetirizine (ZYRTEC ALLERGY) 10 MG tablet Take 1 tablet (10 mg total) by mouth daily. 10/18/21 10/18/22  Dorothyann Peng, MD  clopidogrel (PLAVIX) 75 MG tablet TAKE 1 TABLET(75 MG) BY MOUTH DAILY 10/09/22   [name], Deloris Ping, MD  doxycycline (VIBRA-TABS) 100 MG tablet Take 1 tablet (100 mg total) by mouth 2 (two) times daily. 07/22/22   Young,  Clinton D, MD  EPINEPHrine 0.3 mg/0.3 mL IJ SOAJ injection Inject 0.3 mg into the muscle as needed for anaphylaxis. 06/05/22   Dorothyann Peng, MD  ezetimibe (ZETIA) 10 MG tablet Take 1 tablet (10 mg total) by mouth daily. 08/23/21   [name], Deloris Ping, MD  furosemide (LASIX) 40 MG tablet Take 1 tablet (40 mg total) by mouth daily as needed. 03/28/22   [name], Deloris Ping, MD  isosorbide mononitrate (IMDUR) 60 MG 24 hr tablet TAKE 1 TABLET(60 MG) BY MOUTH DAILY 09/16/22   [name], Deloris Ping, MD  JANUMET 50-500 MG tablet TAKE 1 TABLET BY MOUTH TWICE  DAILY WITH A MEAL 10/10/22   Dorothyann Peng, MD  ketorolac (ACULAR) 0.5 % ophthalmic solution Place 1 drop into the right eye 3 (three) times daily. 08/02/20   [provider]  Lancets (ONETOUCH DELICA PLUS [place]) MISC CHECK BLOOD SUGAR BEFORE BREAKFAST AND DINNER 09/12/20   Charlesetta Ivory, NP  levETIRAcetam (KEPPRA XR) 500 MG 24 hr tablet Take 1 tablet (500 mg total) by mouth daily. 05/15/22   Micki Riley, MD  loperamide (IMODIUM) 2 MG capsule Take 1 capsule (2 mg total) by mouth 4 (four) times daily as needed for diarrhea or loose stools. 11/07/22   Sabas Sous, MD  montelukast (SINGULAIR) 10 MG tablet TAKE 1 TABLET BY MOUTH EVERY DAY 02/17/22   Dorothyann Peng, MD  Nebulizers (COMPRESSOR/NEBULIZER) MISC Use as directed 04/22/18   Waymon Budge, MD  nitroGLYCERIN (NITROSTAT) 0.4 MG SL tablet Place 1 tablet (0.4 mg total) under the tongue every 5 (five) minutes as needed for chest pain. 07/25/19 09/21/23  Dorothyann Peng, MD  ondansetron (ZOFRAN-ODT) 4 MG disintegrating tablet Take 1 tablet (4 mg total) by mouth every 8 (eight) hours as needed for nausea or vomiting. Patient not taking: Reported on 11/24/2022 11/07/22   Sabas Sous, MD  pantoprazole (PROTONIX) 40 MG tablet Take 1 tablet (40 mg total) by mouth daily. 09/19/22   [name], Deloris Ping, MD  Polyvinyl Alcohol-Povidone PF (REFRESH) 1.4-0.6 % SOLN Place 1-2 drops into both eyes 3 (three) times daily as needed (dry/irritated eyes.).    [provider]  potassium chloride SA (KLOR-CON) 20 MEQ tablet Take 1 tablet (20 mEq total) by mouth every other day. 06/16/19   [name], Deloris Ping, MD  prednisoLONE acetate (PRED FORTE) 1 % ophthalmic suspension Apply to eye. 10/11/20   [provider]  pregabalin (LYRICA) 75 MG capsule TAKE 1 CAPSULE(75 MG) BY MOUTH TWICE DAILY 08/05/22   Dorothyann Peng, MD  promethazine-dextromethorphan (PROMETHAZINE-DM) 6.25-15 MG/5ML syrup Take 5 mLs by mouth 4 (four) times daily as needed for cough.  07/08/22   Arnette Felts, FNP  RHOPRESSA 0.02 % SOLN Place 1 drop into the left eye at bedtime. 05/27/21   [provider]  tiZANidine (ZANAFLEX) 4 MG tablet TAKE 1 TABLET(4 MG) BY MOUTH DAILY 05/05/22   Dorothyann Peng, MD  traMADol (ULTRAM) 50 MG tablet Take by mouth.    [provider]  Travoprost, BAK Free, (TRAVATAN) 0.004 % SOLN ophthalmic solution Place 1 drop into the right eye at bedtime. 12/29/19   [provider]      Allergies    Crestor [rosuvastatin calcium], Demerol  [meperidine hcl], and Shellfish allergy    Review of Systems   Review of Systems  All other systems reviewed and are negative.   Physical Exam Updated Vital Signs BP 127/70   Pulse 71   Temp 98.4 F (36.9 C) (Oral)  Resp 13   SpO2 95%  Physical Exam Vitals and nursing note reviewed.  Constitutional:      General: She is not in acute distress.    Appearance: She is well-developed.     Comments: GCS 15, ABC intact  HENT:     Head: Normocephalic and atraumatic.     Comments: No obvious scalp trauma, mild tenderness to the posterior occiput without palpable skull depression or hematoma Eyes:     Extraocular Movements: Extraocular movements intact.     Conjunctiva/sclera: Conjunctivae normal.     Pupils: Pupils are equal, round, and reactive to light.  Neck:     Comments: No midline tenderness to palpation of the cervical spine.  Range of motion intact Cardiovascular:     Rate and Rhythm: Normal rate and regular rhythm.     Pulses: Normal pulses.  Pulmonary:     Effort: Pulmonary effort is normal. No respiratory distress.     Breath sounds: Normal breath sounds.  Chest:     Comments: Clavicles stable nontender to AP compression.  Chest wall stable and nontender to AP and lateral compression. Abdominal:     Palpations: Abdomen is soft.     Tenderness: There is no abdominal tenderness.     Comments: Pelvis stable to lateral compression  Musculoskeletal:     Cervical back:  Neck supple.     Comments: Midline tenderness to palpation between the shoulder blades of the thoracic spine, no lumbar spinal tenderness.  Extremities atraumatic with intact range of motion  Skin:    General: Skin is warm and dry.  Neurological:     Mental Status: She is alert.     Comments: Cranial nerves II through XII grossly intact.  Moving all 4 extremities spontaneously.  Sensation grossly intact all 4 extremities     ED Results / Procedures / Treatments   Labs (all labs ordered are listed, but only abnormal results are displayed) Labs Reviewed  BASIC METABOLIC PANEL - Abnormal; Notable for the following components:      Result Value   Glucose, Bld 118 (*)    Creatinine, Ser 1.06 (*)    GFR, Estimated 55 (*)    All other components within normal limits  CBC - Abnormal; Notable for the following components:   Hemoglobin 11.6 (*)    HCT 34.6 (*)    All other components within normal limits  D-DIMER, QUANTITATIVE - Abnormal; Notable for the following components:   D-Dimer, Quant 4.25 (*)    All other components within normal limits  CBG MONITORING, ED - Abnormal; Notable for the following components:   Glucose-Capillary 115 (*)    All other components within normal limits  BRAIN NATRIURETIC PEPTIDE  URINALYSIS, ROUTINE W REFLEX MICROSCOPIC  TROPONIN I (HIGH SENSITIVITY)    EKG EKG Interpretation Date/Time:  Tuesday December 02 2022 21:22:10 EDT Ventricular Rate:  79 PR Interval:  155 QRS Duration:  78 QT Interval:  382 QTC Calculation: 438 R Axis:   33  Text Interpretation: Sinus rhythm Baseline wander in lead(s) V4 Confirmed by Ernie Avena ([id number]) on 12/02/2022 11:09:11 PM  Radiology CT Thoracic Spine Wo Contrast  Result Date: 12/03/2022 CLINICAL DATA:  Syncopal episode.  Fell striking head. EXAM: CT THORACIC SPINE WITHOUT CONTRAST TECHNIQUE: Multidetector CT images of the thoracic were obtained using the standard protocol without intravenous contrast.  RADIATION DOSE REDUCTION: This exam was performed according to the departmental dose-optimization program which includes automated exposure control, adjustment of the mA and/or kV  according to patient size and/or use of iterative reconstruction technique. COMPARISON:  06/30/2018 FINDINGS: Alignment: Normal alignment. Vertebrae: No vertebral compression deformities. Focal areas of circumscribed sclerosis in several thoracic vertebrae, unchanged since prior study and likely representing benign bone islands. No destructive or expansile bone lesions. Paraspinal and other soft tissues: No abnormal paraspinal soft tissue swelling or infiltration. Disc levels: Degenerative changes with disc space narrowing and mild osteophyte formation throughout. IMPRESSION: 1. Normal alignment.  No acute displaced fractures are identified. 2. Mild degenerative changes. 3. Multiple focal areas of sclerosis are similar to prior study, likely benign bone islands. Electronically Signed   By: Burman Nieves M.D.   On: 12/03/2022 01:28   CT Angio Chest PE W and/or Wo Contrast  Result Date: 12/03/2022 CLINICAL DATA:  Pulmonary embolus suspected with high probability. Syncopal episode followed by fall striking the head. Head pain. EXAM: CT ANGIOGRAPHY CHEST WITH CONTRAST TECHNIQUE: Multidetector CT imaging of the chest was performed using the standard protocol during bolus administration of intravenous contrast. Multiplanar CT image reconstructions and MIPs were obtained to evaluate the vascular anatomy. RADIATION DOSE REDUCTION: This exam was performed according to the departmental dose-optimization program which includes automated exposure control, adjustment of the mA and/or kV according to patient size and/or use of iterative reconstruction technique. CONTRAST:  80mL OMNIPAQUE IOHEXOL 350 MG/ML SOLN COMPARISON:  07/25/2022, 02/18/2022, 08/14/2021 FINDINGS: Cardiovascular: Technically adequate study with good opacification of the  central and segmental pulmonary arteries. No focal filling defects. No evidence of significant pulmonary embolus. Normal heart size. No pericardial effusions. Normal caliber thoracic aorta. No aortic dissection. Great vessel origins are patent. Calcification of the aorta and coronary arteries. Probable left anterior descending stent. Mediastinum/Nodes: No enlarged mediastinal, hilar, or axillary lymph nodes. Thyroid gland, trachea, and esophagus demonstrate no significant findings. Lungs/Pleura: Thick-walled and somewhat spiculated cavitary nodule in the right upper lung measuring about 1.4 x 2.4 cm in diameter. Lesion appears to be slowly progressing since prior studies. Emphysematous changes and scattered fibrosis in the lungs. Bronchial wall thickening and mild bronchiectasis. No pleural effusions. No pneumothorax. Upper Abdomen: No acute abnormalities.  Bilateral breast implants. Musculoskeletal: Normal alignment of the thoracic spine. Focal areas of sclerosis in several thoracic vertebral bodies, unchanged since prior studies, likely benign bone islands. Focal sclerosis in the left scapula is also unchanged. No acute fractures are demonstrated. Review of the MIP images confirms the above findings. IMPRESSION: 1. No evidence of significant pulmonary embolus. 2. Spiculated and cavitated nodule in the right upper lung appears to be slowly growing since prior studies. Malignancy is not excluded. Consider continued follow-up versus PET-CT for further evaluation. 3. Chronic lung changes including emphysema, scattered fibrosis, and bronchiectasis. 4. No acute bony abnormalities. Electronically Signed   By: Burman Nieves M.D.   On: 12/03/2022 01:27   CT Cervical Spine Wo Contrast  Result Date: 12/03/2022 CLINICAL DATA:  Neck trauma. Patient had a syncopal episode and fell, striking the head. Head pain. EXAM: CT CERVICAL SPINE WITHOUT CONTRAST TECHNIQUE: Multidetector CT imaging of the cervical spine was  performed without intravenous contrast. Multiplanar CT image reconstructions were also generated. RADIATION DOSE REDUCTION: This exam was performed according to the departmental dose-optimization program which includes automated exposure control, adjustment of the mA and/or kV according to patient size and/or use of iterative reconstruction technique. COMPARISON:  MRI cervical spine 03/21/2017 FINDINGS: Alignment: Normal. Skull base and vertebrae: No acute fracture. No primary bone lesion or focal pathologic process. Soft tissues and spinal canal: No prevertebral  fluid or swelling. No visible canal hematoma. Disc levels: Mild degenerative changes with small endplate osteophyte formation throughout. Upper chest: Thick-walled cavitary lesion in the right apex measuring 1.3 cm diameter. See additional report of CT chest. Other: None. IMPRESSION: Normal alignment. Mild degenerative changes. No acute displaced fractures. Incidental note of a cavitary lesion in the right apex. See additional report of CT chest. Electronically Signed   By: Burman Nieves M.D.   On: 12/03/2022 01:06   CT Head Wo Contrast  Result Date: 12/03/2022 CLINICAL DATA:  Minor head trauma. Syncopal episode with a fall, striking head. Head pain. EXAM: CT HEAD WITHOUT CONTRAST TECHNIQUE: Contiguous axial images were obtained from the base of the skull through the vertex without intravenous contrast. RADIATION DOSE REDUCTION: This exam was performed according to the departmental dose-optimization program which includes automated exposure control, adjustment of the mA and/or kV according to patient size and/or use of iterative reconstruction technique. COMPARISON:  11/07/2022 FINDINGS: Brain: No evidence of acute infarction, hemorrhage, hydrocephalus, extra-axial collection or mass lesion/mass effect. Mild cerebral atrophy. Low-attenuation changes in the deep white matter consistent with small vessel ischemia. Vascular: No hyperdense vessel or  unexpected calcification. Skull: Normal. Negative for fracture or focal lesion. Sinuses/Orbits: No acute finding. Other: None. IMPRESSION: No acute intracranial abnormalities. Electronically Signed   By: Burman Nieves M.D.   On: 12/03/2022 01:03   DG Pelvis Portable  Result Date: 12/03/2022 CLINICAL DATA:  Fall, right hip pain.  Syncopal episode. EXAM: PORTABLE PELVIS 1-2 VIEWS COMPARISON:  None Available. FINDINGS: There is no evidence of pelvic fracture or diastasis. Mild degenerative changes are noted at the hips bilaterally and lower lumbar spine. IMPRESSION: No acute fracture or dislocation. Electronically Signed   By: Thornell Sartorius M.D.   On: 12/03/2022 00:49   DG Chest Portable 1 View  Result Date: 12/03/2022 CLINICAL DATA:  Syncope, fall. EXAM: PORTABLE CHEST 1 VIEW COMPARISON:  05/07/2022. FINDINGS: The heart is enlarged and the mediastinal contour is within normal limits. Lung volumes are low with mild atelectasis at the lung bases. No effusion or pneumothorax. No acute osseous abnormality. IMPRESSION: 1. Low lung volumes with atelectasis at the lung bases. 2. Cardiomegaly. Electronically Signed   By: Thornell Sartorius M.D.   On: 12/03/2022 00:46    Procedures Procedures    Medications Ordered in ED Medications  acetaminophen (TYLENOL) tablet 650 mg (has no administration in time range)  iohexol (OMNIPAQUE) 350 MG/ML injection 100 mL (80 mLs Intravenous Contrast Given 12/03/22 0042)    ED Course/ Medical Decision Making/ A&P Clinical Course as of 12/03/22 0159  Wed Dec 03, 2022  0024 D-Dimer, Sharene Butters(!): 4.25 [JL]    Clinical Course User Index [JL] Ernie Avena, MD                                 Medical Decision Making Amount and/or Complexity of Data Reviewed Labs: ordered. Decision-making details documented in ED Course. Radiology: ordered.  Risk OTC drugs. Prescription drug management. Decision regarding hospitalization.    77 year old female with medical  history significant for HLD, nonsustained ventricular tachycardia, CAD on Plavix, CHF (last EF 60% with diastolic dysfunction 2023), DM2 who presents to the emergency department with head trauma and a syncopal episode.  The patient states that she was eating dinner earlier today when she felt lightheaded and then suddenly passed out.  She does not remember if she was seated or standing as she  sometimes eats while standing.  She came to on the ground underneath her table and had struck the back of her head.  Unclear duration of loss of consciousness.  No tongue biting or urinary incontinence.  She endorses a headache, was ambulatory after the event, last took Plavix this morning, endorses some chronic/subacute right hip pain, does endorse some mid back pain.  She arrives GCS 15, ABC intact.  He had no chest pain or shortness of breath prior to the episode, no heart palpitations.  She does have a history of nonsustained V. tach.  On arrival, the patient was afebrile, not tachycardic or tachypneic, hemodynamically stable, saturating 100% on room air.  Physical exam as per above revealed some T-spine tenderness, no other clear evidence of trauma.  Patient did hit her head.  Will obtain screening labs in addition to imaging workup to evaluate for both syncope and trauma.  EKG: Sinus rhythm, ventricular rate 79, no acute ischemic changes and no significantly abnormal intervals.  Labs: CBG 115, troponin 4, BNP normal, CBC without a leukocytosis, mild anemia to 11.6, BMP without significant electrolyte abnormality, creatinine 1.06.   CXR: IMPRESSION:  1. Low lung volumes with atelectasis at the lung bases.  2. Cardiomegaly.   XR Pelvis: IMPRESSION:  No acute fracture or dislocation.      CT Head and Cervical Spine: IMPRESSION:  No acute intracranial abnormalities.   IMPRESSION:  Normal alignment. Mild degenerative changes. No acute displaced  fractures. Incidental note of a cavitary lesion in the  right apex.  See additional report of CT chest.    CT T Spine: IMPRESSION:  1. Normal alignment.  No acute displaced fractures are identified.  2. Mild degenerative changes.  3. Multiple focal areas of sclerosis are similar to prior study,  likely benign bone islands.    Dimer elevated to 4.25, in the setting of syncope will obtain CTA PE study.  CTA PE: IMPRESSION:  1. No evidence of significant pulmonary embolus.  2. Spiculated and cavitated nodule in the right upper lung appears  to be slowly growing since prior studies. Malignancy is not  excluded. Consider continued follow-up versus PET-CT for further  evaluation.  3. Chronic lung changes including emphysema, scattered fibrosis, and  bronchiectasis.  4. No acute bony abnormalities.    In the setting of the patient's syncopal episode, I recommended admission for observation given the patient's comorbidities of CHF.  No evidence of PE however new finding of spiculated and cavitated nodule in the right upper lung will likely warrant further evaluation outpatient.  Hospitalist medicine consulted for admission for observation, Dr. Antionette Char accepting, family updated bedside.   Final Clinical Impression(s) / ED Diagnoses Final diagnoses:  Syncope, unspecified syncope type  Cavitary lesion of lung    Rx / DC Orders ED Discharge Orders     None         Ernie Avena, MD 12/03/22 [id number]    Ernie Avena, MD 12/03/22 Denny Peon    Ernie Avena, MD 12/03/22 1912

## 2022-12-02 NOTE — ED Triage Notes (Signed)
Syncopal episode fell hit head and laid on floor for about 2 hours until son got home. C/o head pain

## 2022-12-03 ENCOUNTER — Telehealth: Payer: Self-pay | Admitting: Pulmonary Disease

## 2022-12-03 ENCOUNTER — Other Ambulatory Visit (HOSPITAL_BASED_OUTPATIENT_CLINIC_OR_DEPARTMENT_OTHER): Payer: Self-pay

## 2022-12-03 ENCOUNTER — Emergency Department (HOSPITAL_BASED_OUTPATIENT_CLINIC_OR_DEPARTMENT_OTHER): Payer: Medicare Other

## 2022-12-03 ENCOUNTER — Observation Stay (HOSPITAL_BASED_OUTPATIENT_CLINIC_OR_DEPARTMENT_OTHER): Payer: Medicare Other

## 2022-12-03 ENCOUNTER — Encounter: Payer: Self-pay | Admitting: Pulmonary Disease

## 2022-12-03 DIAGNOSIS — J440 Chronic obstructive pulmonary disease with acute lower respiratory infection: Secondary | ICD-10-CM | POA: Diagnosis not present

## 2022-12-03 DIAGNOSIS — R55 Syncope and collapse: Secondary | ICD-10-CM

## 2022-12-03 DIAGNOSIS — M159 Polyosteoarthritis, unspecified: Secondary | ICD-10-CM | POA: Diagnosis not present

## 2022-12-03 DIAGNOSIS — J984 Other disorders of lung: Secondary | ICD-10-CM

## 2022-12-03 DIAGNOSIS — E669 Obesity, unspecified: Secondary | ICD-10-CM | POA: Diagnosis not present

## 2022-12-03 DIAGNOSIS — H409 Unspecified glaucoma: Secondary | ICD-10-CM | POA: Diagnosis not present

## 2022-12-03 DIAGNOSIS — E785 Hyperlipidemia, unspecified: Secondary | ICD-10-CM | POA: Insufficient documentation

## 2022-12-03 DIAGNOSIS — J188 Other pneumonia, unspecified organism: Secondary | ICD-10-CM | POA: Diagnosis not present

## 2022-12-03 DIAGNOSIS — Z6830 Body mass index (BMI) 30.0-30.9, adult: Secondary | ICD-10-CM | POA: Diagnosis not present

## 2022-12-03 DIAGNOSIS — I2510 Atherosclerotic heart disease of native coronary artery without angina pectoris: Secondary | ICD-10-CM | POA: Diagnosis not present

## 2022-12-03 DIAGNOSIS — J84112 Idiopathic pulmonary fibrosis: Secondary | ICD-10-CM | POA: Diagnosis not present

## 2022-12-03 DIAGNOSIS — Z87891 Personal history of nicotine dependence: Secondary | ICD-10-CM | POA: Diagnosis not present

## 2022-12-03 DIAGNOSIS — J449 Chronic obstructive pulmonary disease, unspecified: Secondary | ICD-10-CM | POA: Diagnosis not present

## 2022-12-03 DIAGNOSIS — E119 Type 2 diabetes mellitus without complications: Secondary | ICD-10-CM | POA: Diagnosis not present

## 2022-12-03 DIAGNOSIS — G8929 Other chronic pain: Secondary | ICD-10-CM | POA: Diagnosis not present

## 2022-12-03 DIAGNOSIS — Z7984 Long term (current) use of oral hypoglycemic drugs: Secondary | ICD-10-CM | POA: Diagnosis not present

## 2022-12-03 DIAGNOSIS — G4733 Obstructive sleep apnea (adult) (pediatric): Secondary | ICD-10-CM | POA: Diagnosis not present

## 2022-12-03 DIAGNOSIS — Z833 Family history of diabetes mellitus: Secondary | ICD-10-CM | POA: Diagnosis not present

## 2022-12-03 DIAGNOSIS — J439 Emphysema, unspecified: Secondary | ICD-10-CM | POA: Diagnosis not present

## 2022-12-03 DIAGNOSIS — Z6831 Body mass index (BMI) 31.0-31.9, adult: Secondary | ICD-10-CM | POA: Diagnosis not present

## 2022-12-03 DIAGNOSIS — N390 Urinary tract infection, site not specified: Secondary | ICD-10-CM | POA: Diagnosis not present

## 2022-12-03 DIAGNOSIS — K219 Gastro-esophageal reflux disease without esophagitis: Secondary | ICD-10-CM | POA: Diagnosis not present

## 2022-12-03 DIAGNOSIS — I110 Hypertensive heart disease with heart failure: Secondary | ICD-10-CM | POA: Diagnosis not present

## 2022-12-03 DIAGNOSIS — R911 Solitary pulmonary nodule: Secondary | ICD-10-CM | POA: Diagnosis not present

## 2022-12-03 DIAGNOSIS — J189 Pneumonia, unspecified organism: Secondary | ICD-10-CM

## 2022-12-03 DIAGNOSIS — R918 Other nonspecific abnormal finding of lung field: Secondary | ICD-10-CM | POA: Diagnosis not present

## 2022-12-03 DIAGNOSIS — M797 Fibromyalgia: Secondary | ICD-10-CM | POA: Diagnosis not present

## 2022-12-03 DIAGNOSIS — D869 Sarcoidosis, unspecified: Secondary | ICD-10-CM | POA: Diagnosis not present

## 2022-12-03 DIAGNOSIS — J479 Bronchiectasis, uncomplicated: Secondary | ICD-10-CM | POA: Diagnosis not present

## 2022-12-03 DIAGNOSIS — I5032 Chronic diastolic (congestive) heart failure: Secondary | ICD-10-CM | POA: Diagnosis not present

## 2022-12-03 LAB — URINALYSIS, ROUTINE W REFLEX MICROSCOPIC
Bilirubin Urine: NEGATIVE
Glucose, UA: NEGATIVE mg/dL
Hgb urine dipstick: NEGATIVE
Ketones, ur: NEGATIVE mg/dL
Nitrite: POSITIVE — AB
Protein, ur: NEGATIVE mg/dL
Specific Gravity, Urine: 1.024 (ref 1.005–1.030)
WBC, UA: >50 WBC/hpf (ref 0–5)
pH: 5.5 (ref 5.0–8.0)

## 2022-12-03 LAB — TROPONIN I (HIGH SENSITIVITY): Troponin I (High Sensitivity): 4 ng/L

## 2022-12-03 LAB — GLUCOSE, CAPILLARY: Glucose-Capillary: 125 mg/dL — ABNORMAL HIGH (ref 70–99)

## 2022-12-03 LAB — CBG MONITORING, ED: Glucose-Capillary: 161 mg/dL — ABNORMAL HIGH (ref 70–99)

## 2022-12-03 MED ORDER — ACETAMINOPHEN 500 MG PO TABS
1000.0000 mg | ORAL_TABLET | Freq: Two times a day (BID) | ORAL | Status: DC | PRN
  Administered 2022-12-03: 1000 mg via ORAL
  Filled 2022-12-03: qty 2

## 2022-12-03 MED ORDER — IOHEXOL 350 MG/ML SOLN
100.0000 mL | Freq: Once | INTRAVENOUS | Status: AC | PRN
Start: 2022-12-03 — End: 2022-12-03
  Administered 2022-12-03: 80 mL via INTRAVENOUS

## 2022-12-03 MED ORDER — NETARSUDIL DIMESYLATE 0.02 % OP SOLN
1.0000 [drp] | Freq: Every day | OPHTHALMIC | Status: DC
Start: 2022-12-03 — End: 2022-12-06
  Administered 2022-12-04 – 2022-12-05 (×2): 1 [drp] via OPHTHALMIC

## 2022-12-03 MED ORDER — LATANOPROST 0.005 % OP SOLN
1.0000 [drp] | Freq: Every day | OPHTHALMIC | Status: DC
  Administered 2022-12-04 – 2022-12-05 (×2): 1 [drp] via OPHTHALMIC
  Filled 2022-12-03: qty 2.5

## 2022-12-03 MED ORDER — MAGNESIUM HYDROXIDE 400 MG/5ML PO SUSP
30.0000 mL | Freq: Every day | ORAL | Status: DC | PRN
  Administered 2022-12-05: 30 mL via ORAL
  Filled 2022-12-03: qty 30

## 2022-12-03 MED ORDER — PANTOPRAZOLE SODIUM 40 MG PO TBEC
40.0000 mg | DELAYED_RELEASE_TABLET | Freq: Every day | ORAL | Status: DC
  Administered 2022-12-03 – 2022-12-06 (×4): 40 mg via ORAL
  Filled 2022-12-03 (×4): qty 1

## 2022-12-03 MED ORDER — POLYVINYL ALCOHOL 1.4 % OP SOLN: 1.0000 [drp] | Freq: Three times a day (TID) | OPHTHALMIC | Status: DC | PRN

## 2022-12-03 MED ORDER — SODIUM CHLORIDE 0.9% FLUSH
3.0000 mL | Freq: Two times a day (BID) | INTRAVENOUS | Status: DC
Start: 2022-12-03 — End: 2022-12-06
  Administered 2022-12-03 – 2022-12-06 (×6): 3 mL via INTRAVENOUS

## 2022-12-03 MED ORDER — POLYVINYL ALCOHOL-POVIDONE PF 1.4-0.6 % OP SOLN: 1.0000 [drp] | Freq: Three times a day (TID) | OPHTHALMIC | Status: DC | PRN

## 2022-12-03 MED ORDER — PREGABALIN 75 MG PO CAPS
75.0000 mg | ORAL_CAPSULE | Freq: Two times a day (BID) | ORAL | Status: DC
  Administered 2022-12-03 – 2022-12-06 (×7): 75 mg via ORAL
  Filled 2022-12-03 (×7): qty 1

## 2022-12-03 MED ORDER — SODIUM CHLORIDE 0.9% FLUSH: 3.0000 mL | INTRAVENOUS | Status: DC | PRN

## 2022-12-03 MED ORDER — ONDANSETRON HCL 4 MG/2ML IJ SOLN: 4.0000 mg | Freq: Four times a day (QID) | INTRAMUSCULAR | Status: DC | PRN

## 2022-12-03 MED ORDER — UMECLIDINIUM-VILANTEROL 62.5-25 MCG/ACT IN AEPB
1.0000 | INHALATION_SPRAY | Freq: Every day | RESPIRATORY_TRACT | Status: DC
  Administered 2022-12-04 – 2022-12-06 (×3): 1 via RESPIRATORY_TRACT
  Filled 2022-12-03: qty 14

## 2022-12-03 MED ORDER — TRAZODONE HCL 50 MG PO TABS: 25.0000 mg | ORAL_TABLET | Freq: Every evening | ORAL | Status: DC | PRN

## 2022-12-03 MED ORDER — CARVEDILOL 25 MG PO TABS
25.0000 mg | ORAL_TABLET | Freq: Two times a day (BID) | ORAL | Status: DC
  Administered 2022-12-03 – 2022-12-06 (×7): 25 mg via ORAL
  Filled 2022-12-03: qty 1
  Filled 2022-12-03: qty 2
  Filled 2022-12-03 (×5): qty 1

## 2022-12-03 MED ORDER — ACETAMINOPHEN 650 MG RE SUPP: 650.0000 mg | Freq: Four times a day (QID) | RECTAL | Status: DC | PRN

## 2022-12-03 MED ORDER — LEVETIRACETAM ER 500 MG PO TB24: 500.0000 mg | ORAL_TABLET | Freq: Every day | ORAL | Status: DC

## 2022-12-03 MED ORDER — METRONIDAZOLE 500 MG/100ML IV SOLN
500.0000 mg | Freq: Two times a day (BID) | INTRAVENOUS | Status: DC
  Administered 2022-12-03 – 2022-12-06 (×6): 500 mg via INTRAVENOUS
  Filled 2022-12-03 (×6): qty 100

## 2022-12-03 MED ORDER — CLOPIDOGREL BISULFATE 75 MG PO TABS
75.0000 mg | ORAL_TABLET | Freq: Every day | ORAL | Status: DC
  Administered 2022-12-03 – 2022-12-06 (×4): 75 mg via ORAL
  Filled 2022-12-03 (×4): qty 1

## 2022-12-03 MED ORDER — SODIUM CHLORIDE 0.9 % IV SOLN
1.0000 g | INTRAVENOUS | Status: DC
  Administered 2022-12-03 – 2022-12-05 (×3): 1 g via INTRAVENOUS
  Filled 2022-12-03 (×3): qty 10

## 2022-12-03 MED ORDER — ACETAMINOPHEN 325 MG PO TABS
650.0000 mg | ORAL_TABLET | Freq: Four times a day (QID) | ORAL | Status: DC | PRN
  Administered 2022-12-05: 650 mg via ORAL
  Filled 2022-12-03: qty 2

## 2022-12-03 MED ORDER — ENOXAPARIN SODIUM 40 MG/0.4ML IJ SOSY
40.0000 mg | PREFILLED_SYRINGE | INTRAMUSCULAR | Status: DC
Start: 2022-12-03 — End: 2022-12-06
  Administered 2022-12-03 – 2022-12-05 (×3): 40 mg via SUBCUTANEOUS
  Filled 2022-12-03 (×3): qty 0.4

## 2022-12-03 MED ORDER — ISOSORBIDE MONONITRATE ER 60 MG PO TB24
60.0000 mg | ORAL_TABLET | Freq: Every day | ORAL | Status: DC
  Administered 2022-12-03 – 2022-12-06 (×4): 60 mg via ORAL
  Filled 2022-12-03 (×4): qty 1

## 2022-12-03 MED ORDER — ACETAMINOPHEN 325 MG PO TABS
650.0000 mg | ORAL_TABLET | Freq: Once | ORAL | Status: AC
  Administered 2022-12-03: 650 mg via ORAL
  Filled 2022-12-03: qty 2

## 2022-12-03 MED ORDER — SODIUM CHLORIDE 0.9% FLUSH
10.0000 mL | Freq: Two times a day (BID) | INTRAVENOUS | Status: DC
Start: 2022-12-03 — End: 2022-12-06
  Administered 2022-12-03 – 2022-12-06 (×6): 10 mL via INTRAVENOUS

## 2022-12-03 MED ORDER — ONDANSETRON HCL 4 MG PO TABS: 4.0000 mg | ORAL_TABLET | Freq: Four times a day (QID) | ORAL | Status: DC | PRN

## 2022-12-03 MED ORDER — METFORMIN HCL 500 MG PO TABS
500.0000 mg | ORAL_TABLET | Freq: Two times a day (BID) | ORAL | Status: DC
  Administered 2022-12-03 – 2022-12-06 (×6): 500 mg via ORAL
  Filled 2022-12-03 (×6): qty 1

## 2022-12-03 MED ORDER — ALBUTEROL SULFATE (2.5 MG/3ML) 0.083% IN NEBU: 2.5000 mg | INHALATION_SOLUTION | Freq: Every day | RESPIRATORY_TRACT | Status: DC | PRN

## 2022-12-03 MED ORDER — ASPIRIN 81 MG PO TBEC
81.0000 mg | DELAYED_RELEASE_TABLET | Freq: Every day | ORAL | Status: DC
  Administered 2022-12-03 – 2022-12-06 (×4): 81 mg via ORAL
  Filled 2022-12-03 (×4): qty 1

## 2022-12-03 MED ORDER — SITAGLIPTIN PHOS-METFORMIN HCL 50-500 MG PO TABS: 1.0000 | ORAL_TABLET | Freq: Two times a day (BID) | ORAL | Status: DC

## 2022-12-03 MED ORDER — LINAGLIPTIN 5 MG PO TABS
5.0000 mg | ORAL_TABLET | Freq: Every day | ORAL | Status: DC
  Administered 2022-12-03 – 2022-12-06 (×4): 5 mg via ORAL
  Filled 2022-12-03 (×4): qty 1

## 2022-12-03 NOTE — H&P (Signed)
[place]   PATIENT NAME: Sheila Reynolds    MR#:  [id number]  DATE OF BIRTH:  19-Meshulem Onorato-1947  DATE OF ADMISSION:  12/02/2022  PRIMARY CARE PHYSICIAN: Dorothyann Peng, MD   Patient is coming from: Home  REQUESTING/REFERRING PHYSICIAN: [facility] [place], Fayrene Fearing, MD)  CHIEF COMPLAINT:  Syncope and head trauma.  HISTORY OF PRESENT ILLNESS:  CHAPIN HALLY is a 76 y.o. African-American female with medical history significant for type 2 diabetes mellitus, hypertension, dyslipidemia, nonsustained V. tach, coronary artery disease, GERD, sarcoidosis and OSA on CPAP, who presented to the ER with acute onset of syncope and head trauma.  The patient was eating dinner earlier today when she felt lightheaded and then suddenly passed out.  She does not remember if she was seated or standing at that time as she occasionally eats while standing.  She came to the ground underneath the table and believes she struck the back of her head as it was sore.  It is unclear how long she was unconscious.  There was no tongue biting or urinary incontinence.  She admits to headache and was able to ambulate after this event.  She was complaining of chronic right hip pain that was slightly worse as well as mid back pain.  She admits to urinary frequency and urgency without dysuria, oliguria or hematuria or flank pain.  She denies any chest pain or palpitations.  No cough or wheezing hemoptysis.  No fever or chills.   ED Course: When she came to the ER BP was 158/78 with otherwise normal vital signs.  Latest BP here was 161/88 with otherwise normal vitals.  Labs revealed unremarkable BMP.  CBC showed mild anemia with hemoglobin 11.6 hematocrit 34.6 and D-dimer was elevated at 4.25.  UA showed a UTI. EKG as reviewed by me : EKG showed normal sinus rhythm with a rate of 79 with poor R wave progression. Imaging: Portable chest x-ray showed low lung volumes with atelectasis at the bases and cardiomegaly.  Pelvic  x-ray showed no acute fracture or dislocation CTA of the chest revealed no evidence for PE.  It showed however a spiculated and cavitary nodule in the right upper lung that appears to be slowly growing since prior studies.  Malignancy is not excluded.  Recommendation was for continued follow-up versus PET CT.  It also showed chronic lung changes including emphysema scattered fibrosis and bronchiectasis with no acute bony abnormalities. PAST MEDICAL HISTORY:   Past Medical History:  Diagnosis Date   Anemia    3 months ago anemic   Anxiety    on meds   Arthritis    "all over" (01/15/2017)   Asthma    Bronchitis with emphysema    Chest pain    Chronic bronchitis (HCC)    Coronary artery disease    a. 01/2017 she underwent orbital atherectomy/DES to the proxmal LAD and PTCA to ostial D2. 2D Echo 01/15/17 showed mild LVH, EF 60-65%, grade 1 DD.   Family history of anesthesia complication    daughter N/V   Fibromyalgia    GERD (gastroesophageal reflux disease)    on meds   Glaucoma    Heart murmur    History of hiatal hernia    Hx of echocardiogram    Echo (03/2013):  Tech limited; Mild focal basal septal hypertrophy, EF 60-65%, normal RVF   Hyperlipidemia    Hypertension    Nonsustained ventricular tachycardia (HCC)    OSA on CPAP  [place]   PATIENT NAME: Sheila Reynolds    MR#:  [id number]  DATE OF BIRTH:  19-Meshulem Onorato-1947  DATE OF ADMISSION:  12/02/2022  PRIMARY CARE PHYSICIAN: Dorothyann Peng, MD   Patient is coming from: Home  REQUESTING/REFERRING PHYSICIAN: [facility] [place], Fayrene Fearing, MD)  CHIEF COMPLAINT:  Syncope and head trauma.  HISTORY OF PRESENT ILLNESS:  CHAPIN HALLY is a 76 y.o. African-American female with medical history significant for type 2 diabetes mellitus, hypertension, dyslipidemia, nonsustained V. tach, coronary artery disease, GERD, sarcoidosis and OSA on CPAP, who presented to the ER with acute onset of syncope and head trauma.  The patient was eating dinner earlier today when she felt lightheaded and then suddenly passed out.  She does not remember if she was seated or standing at that time as she occasionally eats while standing.  She came to the ground underneath the table and believes she struck the back of her head as it was sore.  It is unclear how long she was unconscious.  There was no tongue biting or urinary incontinence.  She admits to headache and was able to ambulate after this event.  She was complaining of chronic right hip pain that was slightly worse as well as mid back pain.  She admits to urinary frequency and urgency without dysuria, oliguria or hematuria or flank pain.  She denies any chest pain or palpitations.  No cough or wheezing hemoptysis.  No fever or chills.   ED Course: When she came to the ER BP was 158/78 with otherwise normal vital signs.  Latest BP here was 161/88 with otherwise normal vitals.  Labs revealed unremarkable BMP.  CBC showed mild anemia with hemoglobin 11.6 hematocrit 34.6 and D-dimer was elevated at 4.25.  UA showed a UTI. EKG as reviewed by me : EKG showed normal sinus rhythm with a rate of 79 with poor R wave progression. Imaging: Portable chest x-ray showed low lung volumes with atelectasis at the bases and cardiomegaly.  Pelvic  x-ray showed no acute fracture or dislocation CTA of the chest revealed no evidence for PE.  It showed however a spiculated and cavitary nodule in the right upper lung that appears to be slowly growing since prior studies.  Malignancy is not excluded.  Recommendation was for continued follow-up versus PET CT.  It also showed chronic lung changes including emphysema scattered fibrosis and bronchiectasis with no acute bony abnormalities. PAST MEDICAL HISTORY:   Past Medical History:  Diagnosis Date   Anemia    3 months ago anemic   Anxiety    on meds   Arthritis    "all over" (01/15/2017)   Asthma    Bronchitis with emphysema    Chest pain    Chronic bronchitis (HCC)    Coronary artery disease    a. 01/2017 she underwent orbital atherectomy/DES to the proxmal LAD and PTCA to ostial D2. 2D Echo 01/15/17 showed mild LVH, EF 60-65%, grade 1 DD.   Family history of anesthesia complication    daughter N/V   Fibromyalgia    GERD (gastroesophageal reflux disease)    on meds   Glaucoma    Heart murmur    History of hiatal hernia    Hx of echocardiogram    Echo (03/2013):  Tech limited; Mild focal basal septal hypertrophy, EF 60-65%, normal RVF   Hyperlipidemia    Hypertension    Nonsustained ventricular tachycardia (HCC)    OSA on CPAP  [place]   PATIENT NAME: Sheila Reynolds    MR#:  [id number]  DATE OF BIRTH:  19-Meshulem Onorato-1947  DATE OF ADMISSION:  12/02/2022  PRIMARY CARE PHYSICIAN: Dorothyann Peng, MD   Patient is coming from: Home  REQUESTING/REFERRING PHYSICIAN: [facility] [place], Fayrene Fearing, MD)  CHIEF COMPLAINT:  Syncope and head trauma.  HISTORY OF PRESENT ILLNESS:  CHAPIN HALLY is a 76 y.o. African-American female with medical history significant for type 2 diabetes mellitus, hypertension, dyslipidemia, nonsustained V. tach, coronary artery disease, GERD, sarcoidosis and OSA on CPAP, who presented to the ER with acute onset of syncope and head trauma.  The patient was eating dinner earlier today when she felt lightheaded and then suddenly passed out.  She does not remember if she was seated or standing at that time as she occasionally eats while standing.  She came to the ground underneath the table and believes she struck the back of her head as it was sore.  It is unclear how long she was unconscious.  There was no tongue biting or urinary incontinence.  She admits to headache and was able to ambulate after this event.  She was complaining of chronic right hip pain that was slightly worse as well as mid back pain.  She admits to urinary frequency and urgency without dysuria, oliguria or hematuria or flank pain.  She denies any chest pain or palpitations.  No cough or wheezing hemoptysis.  No fever or chills.   ED Course: When she came to the ER BP was 158/78 with otherwise normal vital signs.  Latest BP here was 161/88 with otherwise normal vitals.  Labs revealed unremarkable BMP.  CBC showed mild anemia with hemoglobin 11.6 hematocrit 34.6 and D-dimer was elevated at 4.25.  UA showed a UTI. EKG as reviewed by me : EKG showed normal sinus rhythm with a rate of 79 with poor R wave progression. Imaging: Portable chest x-ray showed low lung volumes with atelectasis at the bases and cardiomegaly.  Pelvic  x-ray showed no acute fracture or dislocation CTA of the chest revealed no evidence for PE.  It showed however a spiculated and cavitary nodule in the right upper lung that appears to be slowly growing since prior studies.  Malignancy is not excluded.  Recommendation was for continued follow-up versus PET CT.  It also showed chronic lung changes including emphysema scattered fibrosis and bronchiectasis with no acute bony abnormalities. PAST MEDICAL HISTORY:   Past Medical History:  Diagnosis Date   Anemia    3 months ago anemic   Anxiety    on meds   Arthritis    "all over" (01/15/2017)   Asthma    Bronchitis with emphysema    Chest pain    Chronic bronchitis (HCC)    Coronary artery disease    a. 01/2017 she underwent orbital atherectomy/DES to the proxmal LAD and PTCA to ostial D2. 2D Echo 01/15/17 showed mild LVH, EF 60-65%, grade 1 DD.   Family history of anesthesia complication    daughter N/V   Fibromyalgia    GERD (gastroesophageal reflux disease)    on meds   Glaucoma    Heart murmur    History of hiatal hernia    Hx of echocardiogram    Echo (03/2013):  Tech limited; Mild focal basal septal hypertrophy, EF 60-65%, normal RVF   Hyperlipidemia    Hypertension    Nonsustained ventricular tachycardia (HCC)    OSA on CPAP  [place]   PATIENT NAME: Sheila Reynolds    MR#:  [id number]  DATE OF BIRTH:  19-Meshulem Onorato-1947  DATE OF ADMISSION:  12/02/2022  PRIMARY CARE PHYSICIAN: Dorothyann Peng, MD   Patient is coming from: Home  REQUESTING/REFERRING PHYSICIAN: [facility] [place], Fayrene Fearing, MD)  CHIEF COMPLAINT:  Syncope and head trauma.  HISTORY OF PRESENT ILLNESS:  CHAPIN HALLY is a 76 y.o. African-American female with medical history significant for type 2 diabetes mellitus, hypertension, dyslipidemia, nonsustained V. tach, coronary artery disease, GERD, sarcoidosis and OSA on CPAP, who presented to the ER with acute onset of syncope and head trauma.  The patient was eating dinner earlier today when she felt lightheaded and then suddenly passed out.  She does not remember if she was seated or standing at that time as she occasionally eats while standing.  She came to the ground underneath the table and believes she struck the back of her head as it was sore.  It is unclear how long she was unconscious.  There was no tongue biting or urinary incontinence.  She admits to headache and was able to ambulate after this event.  She was complaining of chronic right hip pain that was slightly worse as well as mid back pain.  She admits to urinary frequency and urgency without dysuria, oliguria or hematuria or flank pain.  She denies any chest pain or palpitations.  No cough or wheezing hemoptysis.  No fever or chills.   ED Course: When she came to the ER BP was 158/78 with otherwise normal vital signs.  Latest BP here was 161/88 with otherwise normal vitals.  Labs revealed unremarkable BMP.  CBC showed mild anemia with hemoglobin 11.6 hematocrit 34.6 and D-dimer was elevated at 4.25.  UA showed a UTI. EKG as reviewed by me : EKG showed normal sinus rhythm with a rate of 79 with poor R wave progression. Imaging: Portable chest x-ray showed low lung volumes with atelectasis at the bases and cardiomegaly.  Pelvic  x-ray showed no acute fracture or dislocation CTA of the chest revealed no evidence for PE.  It showed however a spiculated and cavitary nodule in the right upper lung that appears to be slowly growing since prior studies.  Malignancy is not excluded.  Recommendation was for continued follow-up versus PET CT.  It also showed chronic lung changes including emphysema scattered fibrosis and bronchiectasis with no acute bony abnormalities. PAST MEDICAL HISTORY:   Past Medical History:  Diagnosis Date   Anemia    3 months ago anemic   Anxiety    on meds   Arthritis    "all over" (01/15/2017)   Asthma    Bronchitis with emphysema    Chest pain    Chronic bronchitis (HCC)    Coronary artery disease    a. 01/2017 she underwent orbital atherectomy/DES to the proxmal LAD and PTCA to ostial D2. 2D Echo 01/15/17 showed mild LVH, EF 60-65%, grade 1 DD.   Family history of anesthesia complication    daughter N/V   Fibromyalgia    GERD (gastroesophageal reflux disease)    on meds   Glaucoma    Heart murmur    History of hiatal hernia    Hx of echocardiogram    Echo (03/2013):  Tech limited; Mild focal basal septal hypertrophy, EF 60-65%, normal RVF   Hyperlipidemia    Hypertension    Nonsustained ventricular tachycardia (HCC)    OSA on CPAP  female patient lying in the bed with no acute distress.  EYES: Pupils equal, round, reactive to light and accommodation. No scleral icterus.  Extraocular muscles intact.  HEENT: Head atraumatic, normocephalic. Oropharynx and nasopharynx clear.  NECK:  Supple, no jugular venous distention. No thyroid enlargement, no tenderness.  LUNGS: Normal breath sounds bilaterally, no wheezing, rales,rhonchi or crepitation. No use of accessory muscles of respiration.  CARDIOVASCULAR: Regular rate and rhythm, S1, S2 normal. No murmurs, rubs, or gallops.  ABDOMEN: Soft, nondistended, nontender. Bowel sounds present. No organomegaly or mass.  EXTREMITIES: No pedal edema, cyanosis, or clubbing.  NEUROLOGIC: Cranial nerves II through XII are intact. Muscle strength 5/5 in all extremities. Sensation intact. Gait not checked.  PSYCHIATRIC: The patient is alert and oriented x 3.  Normal affect and good eye contact. SKIN: No obvious rash, lesion, or ulcer.   LABORATORY PANEL:   CBC Recent Labs  Lab 12/02/22 2123  WBC 8.7  HGB 11.6*  HCT 34.6*  PLT 205   ------------------------------------------------------------------------------------------------------------------  Chemistries  Recent Labs  Lab 12/02/22 2123  NA 137  K 3.9  CL 102  CO2 26  GLUCOSE 118*  BUN 18  CREATININE 1.06*  CALCIUM 9.1   ------------------------------------------------------------------------------------------------------------------  Cardiac Enzymes No results for input(s): "TROPONINI" in the last 168 hours. ------------------------------------------------------------------------------------------------------------------  RADIOLOGY:  VAS US CAROTID  Result Date: 12/03/2022 Carotid Arterial Duplex Study Patient Name:  JASILYN WALT [facility]  Date of Exam:   12/03/2022 Medical Rec #: [id number]           Accession #:    [id number] Date of Birth: 1946/01/28           Patient Gender: F Patient Age:   33 years Exam Location:  [facility] Procedure:      VAS US CAROTID Referring Phys: Valente David  --------------------------------------------------------------------------------  Indications:       Syncope. Risk Factors:      Hypertension, Diabetes, coronary artery disease. Comparison Study:  No significant changes seen since prior exam 10/29/18 Performing Technologist: Shona Simpson  Examination Guidelines: A complete evaluation includes B-mode imaging, spectral Doppler, color Doppler, and power Doppler as needed of all accessible portions of each vessel. Bilateral testing is considered an integral part of a complete examination. Limited examinations for reoccurring indications may be performed as noted.  Right Carotid Findings: +----------+--------+--------+--------+------------------+--------+           PSV cm/sEDV cm/sStenosisPlaque DescriptionComments +----------+--------+--------+--------+------------------+--------+ CCA Prox  136     20                                         +----------+--------+--------+--------+------------------+--------+ CCA Distal88      14                                         +----------+--------+--------+--------+------------------+--------+ ICA Prox  67      13      1-39%   heterogenous               +----------+--------+--------+--------+------------------+--------+ ICA Mid   84      22                                         +----------+--------+--------+--------+------------------+--------+  [place]   PATIENT NAME: Sheila Reynolds    MR#:  [id number]  DATE OF BIRTH:  19-Meshulem Onorato-1947  DATE OF ADMISSION:  12/02/2022  PRIMARY CARE PHYSICIAN: Dorothyann Peng, MD   Patient is coming from: Home  REQUESTING/REFERRING PHYSICIAN: [facility] [place], Fayrene Fearing, MD)  CHIEF COMPLAINT:  Syncope and head trauma.  HISTORY OF PRESENT ILLNESS:  CHAPIN HALLY is a 76 y.o. African-American female with medical history significant for type 2 diabetes mellitus, hypertension, dyslipidemia, nonsustained V. tach, coronary artery disease, GERD, sarcoidosis and OSA on CPAP, who presented to the ER with acute onset of syncope and head trauma.  The patient was eating dinner earlier today when she felt lightheaded and then suddenly passed out.  She does not remember if she was seated or standing at that time as she occasionally eats while standing.  She came to the ground underneath the table and believes she struck the back of her head as it was sore.  It is unclear how long she was unconscious.  There was no tongue biting or urinary incontinence.  She admits to headache and was able to ambulate after this event.  She was complaining of chronic right hip pain that was slightly worse as well as mid back pain.  She admits to urinary frequency and urgency without dysuria, oliguria or hematuria or flank pain.  She denies any chest pain or palpitations.  No cough or wheezing hemoptysis.  No fever or chills.   ED Course: When she came to the ER BP was 158/78 with otherwise normal vital signs.  Latest BP here was 161/88 with otherwise normal vitals.  Labs revealed unremarkable BMP.  CBC showed mild anemia with hemoglobin 11.6 hematocrit 34.6 and D-dimer was elevated at 4.25.  UA showed a UTI. EKG as reviewed by me : EKG showed normal sinus rhythm with a rate of 79 with poor R wave progression. Imaging: Portable chest x-ray showed low lung volumes with atelectasis at the bases and cardiomegaly.  Pelvic  x-ray showed no acute fracture or dislocation CTA of the chest revealed no evidence for PE.  It showed however a spiculated and cavitary nodule in the right upper lung that appears to be slowly growing since prior studies.  Malignancy is not excluded.  Recommendation was for continued follow-up versus PET CT.  It also showed chronic lung changes including emphysema scattered fibrosis and bronchiectasis with no acute bony abnormalities. PAST MEDICAL HISTORY:   Past Medical History:  Diagnosis Date   Anemia    3 months ago anemic   Anxiety    on meds   Arthritis    "all over" (01/15/2017)   Asthma    Bronchitis with emphysema    Chest pain    Chronic bronchitis (HCC)    Coronary artery disease    a. 01/2017 she underwent orbital atherectomy/DES to the proxmal LAD and PTCA to ostial D2. 2D Echo 01/15/17 showed mild LVH, EF 60-65%, grade 1 DD.   Family history of anesthesia complication    daughter N/V   Fibromyalgia    GERD (gastroesophageal reflux disease)    on meds   Glaucoma    Heart murmur    History of hiatal hernia    Hx of echocardiogram    Echo (03/2013):  Tech limited; Mild focal basal septal hypertrophy, EF 60-65%, normal RVF   Hyperlipidemia    Hypertension    Nonsustained ventricular tachycardia (HCC)    OSA on CPAP  [place]   PATIENT NAME: Sheila Reynolds    MR#:  [id number]  DATE OF BIRTH:  19-Meshulem Onorato-1947  DATE OF ADMISSION:  12/02/2022  PRIMARY CARE PHYSICIAN: Dorothyann Peng, MD   Patient is coming from: Home  REQUESTING/REFERRING PHYSICIAN: [facility] [place], Fayrene Fearing, MD)  CHIEF COMPLAINT:  Syncope and head trauma.  HISTORY OF PRESENT ILLNESS:  CHAPIN HALLY is a 76 y.o. African-American female with medical history significant for type 2 diabetes mellitus, hypertension, dyslipidemia, nonsustained V. tach, coronary artery disease, GERD, sarcoidosis and OSA on CPAP, who presented to the ER with acute onset of syncope and head trauma.  The patient was eating dinner earlier today when she felt lightheaded and then suddenly passed out.  She does not remember if she was seated or standing at that time as she occasionally eats while standing.  She came to the ground underneath the table and believes she struck the back of her head as it was sore.  It is unclear how long she was unconscious.  There was no tongue biting or urinary incontinence.  She admits to headache and was able to ambulate after this event.  She was complaining of chronic right hip pain that was slightly worse as well as mid back pain.  She admits to urinary frequency and urgency without dysuria, oliguria or hematuria or flank pain.  She denies any chest pain or palpitations.  No cough or wheezing hemoptysis.  No fever or chills.   ED Course: When she came to the ER BP was 158/78 with otherwise normal vital signs.  Latest BP here was 161/88 with otherwise normal vitals.  Labs revealed unremarkable BMP.  CBC showed mild anemia with hemoglobin 11.6 hematocrit 34.6 and D-dimer was elevated at 4.25.  UA showed a UTI. EKG as reviewed by me : EKG showed normal sinus rhythm with a rate of 79 with poor R wave progression. Imaging: Portable chest x-ray showed low lung volumes with atelectasis at the bases and cardiomegaly.  Pelvic  x-ray showed no acute fracture or dislocation CTA of the chest revealed no evidence for PE.  It showed however a spiculated and cavitary nodule in the right upper lung that appears to be slowly growing since prior studies.  Malignancy is not excluded.  Recommendation was for continued follow-up versus PET CT.  It also showed chronic lung changes including emphysema scattered fibrosis and bronchiectasis with no acute bony abnormalities. PAST MEDICAL HISTORY:   Past Medical History:  Diagnosis Date   Anemia    3 months ago anemic   Anxiety    on meds   Arthritis    "all over" (01/15/2017)   Asthma    Bronchitis with emphysema    Chest pain    Chronic bronchitis (HCC)    Coronary artery disease    a. 01/2017 she underwent orbital atherectomy/DES to the proxmal LAD and PTCA to ostial D2. 2D Echo 01/15/17 showed mild LVH, EF 60-65%, grade 1 DD.   Family history of anesthesia complication    daughter N/V   Fibromyalgia    GERD (gastroesophageal reflux disease)    on meds   Glaucoma    Heart murmur    History of hiatal hernia    Hx of echocardiogram    Echo (03/2013):  Tech limited; Mild focal basal septal hypertrophy, EF 60-65%, normal RVF   Hyperlipidemia    Hypertension    Nonsustained ventricular tachycardia (HCC)    OSA on CPAP  female patient lying in the bed with no acute distress.  EYES: Pupils equal, round, reactive to light and accommodation. No scleral icterus.  Extraocular muscles intact.  HEENT: Head atraumatic, normocephalic. Oropharynx and nasopharynx clear.  NECK:  Supple, no jugular venous distention. No thyroid enlargement, no tenderness.  LUNGS: Normal breath sounds bilaterally, no wheezing, rales,rhonchi or crepitation. No use of accessory muscles of respiration.  CARDIOVASCULAR: Regular rate and rhythm, S1, S2 normal. No murmurs, rubs, or gallops.  ABDOMEN: Soft, nondistended, nontender. Bowel sounds present. No organomegaly or mass.  EXTREMITIES: No pedal edema, cyanosis, or clubbing.  NEUROLOGIC: Cranial nerves II through XII are intact. Muscle strength 5/5 in all extremities. Sensation intact. Gait not checked.  PSYCHIATRIC: The patient is alert and oriented x 3.  Normal affect and good eye contact. SKIN: No obvious rash, lesion, or ulcer.   LABORATORY PANEL:   CBC Recent Labs  Lab 12/02/22 2123  WBC 8.7  HGB 11.6*  HCT 34.6*  PLT 205   ------------------------------------------------------------------------------------------------------------------  Chemistries  Recent Labs  Lab 12/02/22 2123  NA 137  K 3.9  CL 102  CO2 26  GLUCOSE 118*  BUN 18  CREATININE 1.06*  CALCIUM 9.1   ------------------------------------------------------------------------------------------------------------------  Cardiac Enzymes No results for input(s): "TROPONINI" in the last 168 hours. ------------------------------------------------------------------------------------------------------------------  RADIOLOGY:  VAS US CAROTID  Result Date: 12/03/2022 Carotid Arterial Duplex Study Patient Name:  JASILYN WALT [facility]  Date of Exam:   12/03/2022 Medical Rec #: [id number]           Accession #:    [id number] Date of Birth: 1946/01/28           Patient Gender: F Patient Age:   33 years Exam Location:  [facility] Procedure:      VAS US CAROTID Referring Phys: Valente David  --------------------------------------------------------------------------------  Indications:       Syncope. Risk Factors:      Hypertension, Diabetes, coronary artery disease. Comparison Study:  No significant changes seen since prior exam 10/29/18 Performing Technologist: Shona Simpson  Examination Guidelines: A complete evaluation includes B-mode imaging, spectral Doppler, color Doppler, and power Doppler as needed of all accessible portions of each vessel. Bilateral testing is considered an integral part of a complete examination. Limited examinations for reoccurring indications may be performed as noted.  Right Carotid Findings: +----------+--------+--------+--------+------------------+--------+           PSV cm/sEDV cm/sStenosisPlaque DescriptionComments +----------+--------+--------+--------+------------------+--------+ CCA Prox  136     20                                         +----------+--------+--------+--------+------------------+--------+ CCA Distal88      14                                         +----------+--------+--------+--------+------------------+--------+ ICA Prox  67      13      1-39%   heterogenous               +----------+--------+--------+--------+------------------+--------+ ICA Mid   84      22                                         +----------+--------+--------+--------+------------------+--------+  [place]   PATIENT NAME: Sheila Reynolds    MR#:  [id number]  DATE OF BIRTH:  19-Meshulem Onorato-1947  DATE OF ADMISSION:  12/02/2022  PRIMARY CARE PHYSICIAN: Dorothyann Peng, MD   Patient is coming from: Home  REQUESTING/REFERRING PHYSICIAN: [facility] [place], Fayrene Fearing, MD)  CHIEF COMPLAINT:  Syncope and head trauma.  HISTORY OF PRESENT ILLNESS:  CHAPIN HALLY is a 76 y.o. African-American female with medical history significant for type 2 diabetes mellitus, hypertension, dyslipidemia, nonsustained V. tach, coronary artery disease, GERD, sarcoidosis and OSA on CPAP, who presented to the ER with acute onset of syncope and head trauma.  The patient was eating dinner earlier today when she felt lightheaded and then suddenly passed out.  She does not remember if she was seated or standing at that time as she occasionally eats while standing.  She came to the ground underneath the table and believes she struck the back of her head as it was sore.  It is unclear how long she was unconscious.  There was no tongue biting or urinary incontinence.  She admits to headache and was able to ambulate after this event.  She was complaining of chronic right hip pain that was slightly worse as well as mid back pain.  She admits to urinary frequency and urgency without dysuria, oliguria or hematuria or flank pain.  She denies any chest pain or palpitations.  No cough or wheezing hemoptysis.  No fever or chills.   ED Course: When she came to the ER BP was 158/78 with otherwise normal vital signs.  Latest BP here was 161/88 with otherwise normal vitals.  Labs revealed unremarkable BMP.  CBC showed mild anemia with hemoglobin 11.6 hematocrit 34.6 and D-dimer was elevated at 4.25.  UA showed a UTI. EKG as reviewed by me : EKG showed normal sinus rhythm with a rate of 79 with poor R wave progression. Imaging: Portable chest x-ray showed low lung volumes with atelectasis at the bases and cardiomegaly.  Pelvic  x-ray showed no acute fracture or dislocation CTA of the chest revealed no evidence for PE.  It showed however a spiculated and cavitary nodule in the right upper lung that appears to be slowly growing since prior studies.  Malignancy is not excluded.  Recommendation was for continued follow-up versus PET CT.  It also showed chronic lung changes including emphysema scattered fibrosis and bronchiectasis with no acute bony abnormalities. PAST MEDICAL HISTORY:   Past Medical History:  Diagnosis Date   Anemia    3 months ago anemic   Anxiety    on meds   Arthritis    "all over" (01/15/2017)   Asthma    Bronchitis with emphysema    Chest pain    Chronic bronchitis (HCC)    Coronary artery disease    a. 01/2017 she underwent orbital atherectomy/DES to the proxmal LAD and PTCA to ostial D2. 2D Echo 01/15/17 showed mild LVH, EF 60-65%, grade 1 DD.   Family history of anesthesia complication    daughter N/V   Fibromyalgia    GERD (gastroesophageal reflux disease)    on meds   Glaucoma    Heart murmur    History of hiatal hernia    Hx of echocardiogram    Echo (03/2013):  Tech limited; Mild focal basal septal hypertrophy, EF 60-65%, normal RVF   Hyperlipidemia    Hypertension    Nonsustained ventricular tachycardia (HCC)    OSA on CPAP

## 2022-12-03 NOTE — Progress Notes (Signed)
Plan of Care Note for accepted transfer   Patient: Sheila Reynolds MRN: [id number]   DOA: 12/02/2022  Facility requesting transfer: [facility]   Requesting Provider: Dr. Karene Fry   Reason for transfer: Syncope   Facility course: 77 year old female with COPD, OSA, sarcoid, HTN, DM, CAD, and NSVT presents with headache after a syncopal event at home.  She was reportedly eating dinner when she became acutely lightheaded and suffered a loss of consciousness.  She woke on the floor with headache.  Labs are notable for normal BNP, normal troponin, and D-dimer 4.25.  CTA chest is negative for PE but notable for slowly growing spiculated and cavitary RUL nodule.  Trauma imaging was negative.  She is in sinus rhythm with normal intervals and no blocks on EKG.  Plan of care: The patient is accepted for admission to Telemetry unit, at [facility].   Author: Briscoe Deutscher, MD 12/03/2022  Check [email or web address] for on-call coverage.  Nursing staff, Please call [facility] Admits & Consults System-Wide number on Amion as soon as patient's arrival, so appropriate admitting provider can evaluate the pt.

## 2022-12-03 NOTE — ED Notes (Signed)
Called [facility] to transport patient to [facility] Rm# [phone]

## 2022-12-03 NOTE — Assessment & Plan Note (Signed)
-   She will be placed on IV Rocephin and will follow blood and urine cultures.

## 2022-12-03 NOTE — Assessment & Plan Note (Signed)
-   We will continue her inhalers. 

## 2022-12-03 NOTE — Assessment & Plan Note (Signed)
-   We will continue aspirin, Plavix, Imdur and beta-blocker therapy as well as statin therapy.

## 2022-12-03 NOTE — Consult Note (Signed)
NAME:  Sheila Reynolds, MRN:  [id number], DOB:  04-13-1945, LOS: 0 ADMISSION DATE:  12/02/2022, CONSULTATION DATE:  12/03/2022 REFERRING MD:  Dr. Arville Care, CHIEF COMPLAINT:  Cavitary lesion   History of Present Illness:  77 year old female with PMH as below, which is significant for COPD, bronchiolitis, sarcoidosis, OSA on CPAP, DM, and CAD. She has been undergoing surveillance imaging of some pulmonary nodules including a RUL nodule by Dr. Maple Hudson in the pulmonary clinic. She was in her usual state of health 10/22 when she was eating dinner and suffered a syncopal episode. She woke up under the table and believed she struck her head. Upon arrival to the ED she complained of back pain. She was completely awake and responsive as well as hemodynamically stable. Trama workup was negative. CTA of the chest was done to investigate for PE. This was negative for PE, but did describe a new spiculated cavitary lesion in the right upper lobe. She was admitted to the hospitalist service and PCCM was asked to evaluate for cavitary lesion.   On my evaluation she denies SOB, fever, chills, night sweats, productive cough, hemoptysis. Denies sick contacts, foreign travel, contact with foreigners, contact with prisoners. She endorses an unintentional 10 lb weight loss over the past few months.   Reports at baseline she is able to ambulate around her house OK. Stairs and walking to the mailbox (80 ft) are limited by dyspnea. She also described CPAP non-compliance.   Pertinent  Medical History   has a past medical history of Anemia, Anxiety, Arthritis, Asthma, Bronchitis with emphysema, Chest pain, Chronic bronchitis (HCC), Coronary artery disease, Family history of anesthesia complication, Fibromyalgia, GERD (gastroesophageal reflux disease), Glaucoma, Heart murmur, History of hiatal hernia, echocardiogram, Hyperlipidemia, Hypertension, Nonsustained ventricular tachycardia (HCC), OSA on CPAP, Premature atrial  contractions, PVC (premature ventricular contraction), Sarcoidosis, and Type II diabetes mellitus (HCC).   Significant Hospital Events: Including procedures, antibiotic start and stop dates in addition to other pertinent events     Interim History / Subjective:    Objective   Blood pressure (!) 161/88, pulse 64, temperature 97.7 F (36.5 C), temperature source Oral, resp. rate 17, SpO2 99%.       No intake or output data in the 24 hours ending 12/03/22 1643 There were no vitals filed for this visit.  Examination: General: elderly appearing female in NAD HENT: [place]/AT, PERRL, No JVD Lungs: Coarse crackles in bilateral bases Cardiovascular: RRR, no MRG. No edema.  Abdomen: Soft, NT, ND Extremities: no acute deformity or ROM limitations.  Neuro: Alert, oriented, non-focal  Resolved Hospital Problem list     Assessment & Plan:   Cavitary lesion of the right upper lobe: differential includes infection and malignancy. She has no infectious symptoms, which does not totally rule out atypical infection. Concern here is for malignancy because she has had a nodule in this location dating back at least to January. Unintentional weight loss as well. No risk factors or symptoms consistent with TB. Sputum cultures from June all negative including AFB.  - prolonged course of antibiotics with anaerobic coverage like Augmentin for at least 2-3 weeks.  - Have discussed navigational bronchoscopy with her and we will arrange follow-up in our pulmonary clinic to discuss further and arrange.  - Would ambulate with pulse ox to see if she qualifies for home O2.   COPD - without acute exacerbation ILD, sarcoid/UIP stable over years - continue home Anoro  - PRN albuterol - Continue home singulair  OSA  on CPAP - Non-compliant with CPAP - Will offer at bedtime while inpatient.   Syncope: per primary  Remainder per primary  Best Practice (right click and "Reselect all SmartList Selections" daily)    Per Primary  Labs   CBC: Recent Labs  Lab 12/02/22 2123  WBC 8.7  HGB 11.6*  HCT 34.6*  MCV 83.2  PLT 205    Basic Metabolic Panel: Recent Labs  Lab 12/02/22 2123  NA 137  K 3.9  CL 102  CO2 26  GLUCOSE 118*  BUN 18  CREATININE 1.06*  CALCIUM 9.1   GFR: Estimated Creatinine Clearance: 43.6 mL/min (A) (by C-G formula based on SCr of 1.06 mg/dL (H)). Recent Labs  Lab 12/02/22 2123  WBC 8.7    Liver Function Tests: No results for input(s): "AST", "ALT", "ALKPHOS", "BILITOT", "PROT", "ALBUMIN" in the last 168 hours. No results for input(s): "LIPASE", "AMYLASE" in the last 168 hours. No results for input(s): "AMMONIA" in the last 168 hours.  ABG    Component Value Date/Time   PHART 7.395 12/12/2016 1452   PCO2ART 40.7 12/12/2016 1452   PO2ART 87.0 12/12/2016 1452   HCO3 24.9 12/12/2016 1452   TCO2 16 (L) 02/26/2021 1805   O2SAT 97.0 12/12/2016 1452     Coagulation Profile: No results for input(s): "INR", "PROTIME" in the last 168 hours.  Cardiac Enzymes: No results for input(s): "CKTOTAL", "CKMB", "CKMBINDEX", "TROPONINI" in the last 168 hours.  HbA1C: Hgb A1c MFr Bld  Date/Time Value Ref Range Status  09/22/2022 12:56 PM 6.3 (H) 4.8 - 5.6 % Final    Comment:             Prediabetes: 5.7 - 6.4          Diabetes: >6.4          Glycemic control for adults with diabetes: <7.0   06/05/2022 04:48 PM 6.4 (H) 4.8 - 5.6 % Final    Comment:             Prediabetes: 5.7 - 6.4          Diabetes: >6.4          Glycemic control for adults with diabetes: <7.0     CBG: Recent Labs  Lab 12/02/22 2240 12/03/22 1112  GLUCAP 115* 161*    Review of Systems:   Bolds are positive  Constitutional: weight loss, gain, night sweats, Fevers, chills, fatigue .  HEENT: headaches, Sore throat, sneezing, nasal congestion, post nasal drip, Difficulty swallowing, Tooth/dental problems, visual complaints visual changes, ear ache CV:  chest pain, radiates:,Orthopnea,  PND, swelling in lower extremities, dizziness, palpitations, syncope.  GI  heartburn, indigestion, abdominal pain, nausea, vomiting, diarrhea, change in bowel habits, loss of appetite, bloody stools.  Resp: cough, productive: , hemoptysis, dyspnea on exertion, chest pain, pleuritic.  Skin: rash or itching or icterus GU: dysuria, change in color of urine, urgency or frequency. flank pain, hematuria  MS: joint pain or swelling. decreased range of motion  Psych: change in mood or affect. depression or anxiety.  Neuro: difficulty with speech, weakness, numbness, ataxia    Past Medical History:  She,  has a past medical history of Anemia, Anxiety, Arthritis, Asthma, Bronchitis with emphysema, Chest pain, Chronic bronchitis (HCC), Coronary artery disease, Family history of anesthesia complication, Fibromyalgia, GERD (gastroesophageal reflux disease), Glaucoma, Heart murmur, History of hiatal hernia, echocardiogram, Hyperlipidemia, Hypertension, Nonsustained ventricular tachycardia (HCC), OSA on CPAP, Premature atrial contractions, PVC (premature ventricular contraction), Sarcoidosis, and Type II diabetes mellitus (  [facility]).   Surgical History:   Past Surgical History:  Procedure Laterality Date   BREAST SURGERY     CARDIAC CATHETERIZATION  05/04/2007   reveals overall normal left ventricular systolic function. Ejection fraction 65-70%   CATARACT EXTRACTION W/PHACO Right 07/20/2013   Procedure: CATARACT EXTRACTION PHACO AND INTRAOCULAR LENS PLACEMENT (IOC);  Surgeon: Chalmers Guest, MD;  Location: [facility] OR;  Service: Ophthalmology;  Laterality: Right;   COLONOSCOPY W/ BIOPSIES AND POLYPECTOMY     COLONOSCOPY WITH PROPOFOL N/A 09/07/2014   Procedure: COLONOSCOPY WITH PROPOFOL;  Surgeon: Charna Elizabeth, MD;  Location: [facility];  Service: Endoscopy;  Laterality: N/A;   COLONOSCOPY WITH PROPOFOL N/A 01/10/2020   Procedure: COLONOSCOPY WITH PROPOFOL;  Surgeon: Charna Elizabeth, MD;  Location: [facility];  Service:  Endoscopy;  Laterality: N/A;   CORONARY ANGIOPLASTY WITH STENT PLACEMENT  01/15/2017   CORONARY ATHERECTOMY N/A 01/15/2017   Procedure: CORONARY ATHERECTOMY;  Surgeon: Yvonne Kendall, MD;  Location: [facility];  Service: Cardiovascular;  Laterality: N/A;   CORONARY BALLOON ANGIOPLASTY N/A 01/15/2017   Procedure: CORONARY BALLOON ANGIOPLASTY;  Surgeon: Yvonne Kendall, MD;  Location: [facility];  Service: Cardiovascular;  Laterality: N/A;   CORONARY PRESSURE/FFR STUDY N/A 12/12/2016   Procedure: INTRAVASCULAR PRESSURE WIRE/FFR STUDY;  Surgeon: Yvonne Kendall, MD;  Location: [facility];  Service: Cardiovascular;  Laterality: N/A;   CORONARY STENT INTERVENTION N/A 01/15/2017   Procedure: CORONARY STENT INTERVENTION;  Surgeon: Yvonne Kendall, MD;  Location: [facility];  Service: Cardiovascular;  Laterality: N/A;   ESOPHAGOGASTRODUODENOSCOPY (EGD) WITH PROPOFOL N/A 09/07/2014   Procedure: ESOPHAGOGASTRODUODENOSCOPY (EGD) WITH PROPOFOL;  Surgeon: Charna Elizabeth, MD;  Location: [facility];  Service: Endoscopy;  Laterality: N/A;   EXTERNAL EAR SURGERY Bilateral 1970s   tumors removed   EYE SURGERY Left 2019   cataract extraction    EYE SURGERY Right 02/09/2018   eyelid surgery    MINI SHUNT INSERTION Right 07/20/2013   Procedure: INSERTION OF GLAUCOMA FILTRATION DEVICE RIGHT EYE;  Surgeon: Chalmers Guest, MD;  Location: [facility] OR;  Service: Ophthalmology;  Laterality: Right;   MITOMYCIN C APPLICATION Right 07/20/2013   Procedure: MITOMYCIN C APPLICATION;  Surgeon: Chalmers Guest, MD;  Location: [facility] OR;  Service: Ophthalmology;  Laterality: Right;   MITOMYCIN C APPLICATION Right 02/21/2015   Procedure: MITOMYCIN C APPLICATION RIGHT EYE;  Surgeon: Chalmers Guest, MD;  Location: [facility] OR;  Service: Ophthalmology;  Laterality: Right;   PLACEMENT OF BREAST IMPLANTS Bilateral 1992   "took all my breast tissue out; put implants in;fibrocystic breast disease "   POLYPECTOMY  01/10/2020    Procedure: POLYPECTOMY;  Surgeon: Charna Elizabeth, MD;  Location: [facility];  Service: Endoscopy;;   RIGHT/LEFT HEART CATH AND CORONARY ANGIOGRAPHY N/A 12/12/2016   Procedure: RIGHT/LEFT HEART CATH AND CORONARY ANGIOGRAPHY;  Surgeon: Yvonne Kendall, MD;  [facility];  Service: Cardiovascular;  Laterality: N/A;   TONSILLECTOMY     TOTAL ABDOMINAL HYSTERECTOMY  1982   TRABECULECTOMY Right 02/21/2015   Procedure: TRABECULECTOMY WITH [facility] ON THE RIGHT EYE;  Surgeon: Chalmers Guest, MD;  Location: [facility] OR;  Service: Ophthalmology;  Laterality: Right;     Social History:   reports that she quit smoking about 42 years ago. Her smoking use included cigarettes. She started smoking about 46 years ago. She has a 4 pack-year smoking history. She has never used smokeless tobacco. She reports that she does not drink alcohol and does not use drugs.   Family History:  Her family history includes Cancer in her mother and paternal grandmother; Diabetes in her brother and father; Glaucoma in her brother and father; Heart disease in her father and paternal grandmother.   Allergies Allergies  Allergen Reactions   Crestor [Rosuvastatin Calcium] Other (See Comments)    muscle aches   Demerol  [Meperidine Hcl]     Other reaction(s): Hallucinations   Shellfish Allergy Itching and Other (See Comments)    Crab, shrimp and lobster ---lips itch and tingle Was told not to eat again after having a allergy test. Lobster, crab and shrimp      Home Medications  Prior to Admission medications   Medication Sig Start Date End Date Taking? Authorizing Provider  acetaminophen (TYLENOL) 500 MG tablet Take 1,000 mg by mouth 2 (two) times daily as needed for moderate pain or headache.   Yes [provider]  albuterol (PROVENTIL) (2.5 MG/3ML) 0.083% nebulizer solution Take 3 mLs (2.5 mg total) by nebulization daily as needed for wheezing or shortness of breath. 04/01/21  Yes Arnette Felts, FNP  albuterol  (VENTOLIN HFA) 108 (90 Base) MCG/ACT inhaler Inhale 2 puffs into the lungs every 6 (six) hours as needed for wheezing or shortness of breath. 12/30/19  Yes Young, Clinton D, MD  ANORO ELLIPTA 62.5-25 MCG/ACT AEPB INHALE 1 PUFF INTO THE LUNGS DAILY AT 6 AM 02/21/22  Yes [name], Praveen, MD  aspirin EC 81 MG tablet Take 81 mg by mouth daily. Swallow whole.   Yes [provider]  brimonidine (ALPHAGAN P) 0.1 % SOLN Place 1 drop into both eyes 2 (two) times daily.    Yes [provider]  brinzolamide (AZOPT) 1 % ophthalmic suspension Place 1 drop into both eyes 3 (three) times daily.   Yes [provider]  carvedilol (COREG) 25 MG tablet Take 1 tablet (25 mg total) by mouth 2 (two) times daily. 01/01/22  Yes Corky Crafts, MD  cetirizine (ZYRTEC ALLERGY) 10 MG tablet Take 1 tablet (10 mg total) by mouth daily. 10/18/21 12/03/22 Yes Dorothyann Peng, MD  clopidogrel (PLAVIX) 75 MG tablet TAKE 1 TABLET(75 MG) BY MOUTH DAILY 10/09/22  Yes [name], Deloris Ping, MD  EPINEPHrine 0.3 mg/0.3 mL IJ SOAJ injection Inject 0.3 mg into the muscle as needed for anaphylaxis. 06/05/22  Yes Dorothyann Peng, MD  ezetimibe (ZETIA) 10 MG tablet Take 1 tablet (10 mg total) by mouth daily. 08/23/21  Yes [name], Deloris Ping, MD  furosemide (LASIX) 40 MG tablet Take 1 tablet (40 mg total) by mouth daily as needed. 03/28/22  Yes [name], Deloris Ping, MD  isosorbide mononitrate (IMDUR) 60 MG 24 hr tablet TAKE 1 TABLET(60 MG) BY MOUTH DAILY 09/16/22  Yes [name], Deloris Ping, MD  JANUMET 50-500 MG tablet TAKE 1 TABLET BY MOUTH TWICE DAILY WITH A MEAL 10/10/22  Yes Dorothyann Peng, MD  ketorolac (ACULAR) 0.5 % ophthalmic solution Place 1 drop into the right eye 3 (three) times daily. 08/02/20  Yes [provider]  loperamide (IMODIUM) 2 MG capsule Take 1 capsule (2 mg total) by mouth 4 (four) times daily as needed for diarrhea or loose stools. 11/07/22  Yes Sabas Sous, MD  montelukast (SINGULAIR) 10 MG tablet TAKE 1  TABLET BY MOUTH EVERY DAY 02/17/22  Yes Dorothyann Peng, MD  nitroGLYCERIN (NITROSTAT) 0.4 MG SL tablet Place 1 tablet (0.4 mg total) under the tongue every 5 (five) minutes as needed for chest pain. 07/25/19 09/21/23 Yes Dorothyann Peng, MD  pantoprazole (PROTONIX) 40 MG tablet Take  1 tablet (40 mg total) by mouth daily. 09/19/22  Yes [name], Deloris Ping, MD  potassium chloride SA (KLOR-CON) 20 MEQ tablet Take 1 tablet (20 mEq total) by mouth every other day. 06/16/19  Yes [name], Deloris Ping, MD  prednisoLONE acetate (PRED FORTE) 1 % ophthalmic suspension Apply to eye. 10/11/20  Yes [provider]  pregabalin (LYRICA) 75 MG capsule TAKE 1 CAPSULE(75 MG) BY MOUTH TWICE DAILY 08/05/22  Yes Dorothyann Peng, MD  RHOPRESSA 0.02 % SOLN Place 1 drop into the left eye at bedtime. 05/27/21  Yes [provider]  tiZANidine (ZANAFLEX) 4 MG tablet TAKE 1 TABLET(4 MG) BY MOUTH DAILY 05/05/22  Yes Dorothyann Peng, MD  Travoprost, BAK Free, (TRAVATAN) 0.004 % SOLN ophthalmic solution Place 1 drop into the right eye at bedtime. 12/29/19  Yes [provider]  amoxicillin-clavulanate (AUGMENTIN) 875-125 MG tablet Take 1 tablet by mouth 2 (two) times daily. Patient not taking: Reported on 11/24/2022 08/05/22   Waymon Budge, MD  atorvastatin (LIPITOR) 80 MG tablet Take 1 tablet (80 mg total) by mouth daily. 08/23/21   [name], Deloris Ping, MD  Blood Glucose Monitoring Suppl (ACCU-CHEK AVIVA PLUS) w/Device KIT Use to check blood sugars 3 times a day. Dx code e11.65 01/03/20   Dorothyann Peng, MD  doxycycline (VIBRA-TABS) 100 MG tablet Take 1 tablet (100 mg total) by mouth 2 (two) times daily. Patient not taking: Reported on 12/03/2022 07/22/22   Waymon Budge, MD  Lancets [facility] DELICA PLUS [place]) MISC CHECK BLOOD SUGAR BEFORE BREAKFAST AND DINNER 09/12/20   Charlesetta Ivory, NP  levETIRAcetam (KEPPRA XR) 500 MG 24 hr tablet Take 1 tablet (500 mg total) by mouth daily. Patient not taking: Reported on  12/03/2022 05/15/22   Micki Riley, MD  Nebulizers (COMPRESSOR/NEBULIZER) MISC Use as directed 04/22/18   Waymon Budge, MD  ondansetron (ZOFRAN-ODT) 4 MG disintegrating tablet Take 1 tablet (4 mg total) by mouth every 8 (eight) hours as needed for nausea or vomiting. Patient not taking: Reported on 11/24/2022 11/07/22   Sabas Sous, MD  Polyvinyl Alcohol-Povidone PF (REFRESH) 1.4-0.6 % SOLN Place 1-2 drops into both eyes 3 (three) times daily as needed (dry/irritated eyes.).    [provider]  promethazine-dextromethorphan (PROMETHAZINE-DM) 6.25-15 MG/5ML syrup Take 5 mLs by mouth 4 (four) times daily as needed for cough. Patient not taking: Reported on 12/03/2022 07/08/22   Arnette Felts, FNP     Critical care time:      Joneen Roach, AGACNP-BC [facility]  See Amion for personal pager PCCM on call pager [phone] until 7pm. Please call Elink 7p-7a. [phone]  12/03/2022 6:00 PM

## 2022-12-03 NOTE — Progress Notes (Signed)
Carotid arterial duplex completed. Please see CV Procedures for preliminary results.  Shona Simpson, RVT 12/03/22 4:25 PM

## 2022-12-03 NOTE — Assessment & Plan Note (Signed)
-   This is associated with head trauma without significant injury. - The patient will be admitted to an observation medically monitored bed. - Will check orthostatics q 12 hours. - Will obtain a bilateral carotid Doppler and 2D echo. - The patient will be monitored for arrhythmias. -Differential diagnoses would include neurally mediated syncope, cardiogenic, arrhythmias related,  orthostatic hypotension and less likely hypoglycemia.

## 2022-12-03 NOTE — Assessment & Plan Note (Addendum)
- 

## 2022-12-03 NOTE — ED Notes (Signed)
Offered breakfast options available to [place], pt declined and son to get her breakfast. Given coffee.

## 2022-12-03 NOTE — Assessment & Plan Note (Addendum)
-   Pulmonary consult will be obtained. - I notified Dr. Isaiah Serge about the patient. - Will defer further workup to his discretion.

## 2022-12-03 NOTE — Plan of Care (Signed)
  Problem: Education: Goal: Knowledge of General Education information will improve Description Including pain rating scale, medication(s)/side effects and non-pharmacologic comfort measures Outcome: Progressing   Problem: Health Behavior/Discharge Planning: Goal: Ability to manage health-related needs will improve Outcome: Progressing   

## 2022-12-04 ENCOUNTER — Observation Stay (HOSPITAL_COMMUNITY): Payer: Medicare Other

## 2022-12-04 ENCOUNTER — Other Ambulatory Visit: Payer: Self-pay

## 2022-12-04 DIAGNOSIS — J188 Other pneumonia, unspecified organism: Secondary | ICD-10-CM | POA: Diagnosis present

## 2022-12-04 DIAGNOSIS — J440 Chronic obstructive pulmonary disease with acute lower respiratory infection: Secondary | ICD-10-CM | POA: Diagnosis present

## 2022-12-04 DIAGNOSIS — I2510 Atherosclerotic heart disease of native coronary artery without angina pectoris: Secondary | ICD-10-CM | POA: Diagnosis present

## 2022-12-04 DIAGNOSIS — J439 Emphysema, unspecified: Secondary | ICD-10-CM | POA: Diagnosis present

## 2022-12-04 DIAGNOSIS — I5032 Chronic diastolic (congestive) heart failure: Secondary | ICD-10-CM | POA: Diagnosis present

## 2022-12-04 DIAGNOSIS — G8929 Other chronic pain: Secondary | ICD-10-CM | POA: Diagnosis present

## 2022-12-04 DIAGNOSIS — N390 Urinary tract infection, site not specified: Secondary | ICD-10-CM | POA: Diagnosis present

## 2022-12-04 DIAGNOSIS — R55 Syncope and collapse: Secondary | ICD-10-CM

## 2022-12-04 DIAGNOSIS — D869 Sarcoidosis, unspecified: Secondary | ICD-10-CM | POA: Diagnosis present

## 2022-12-04 DIAGNOSIS — E669 Obesity, unspecified: Secondary | ICD-10-CM | POA: Diagnosis present

## 2022-12-04 DIAGNOSIS — J84112 Idiopathic pulmonary fibrosis: Secondary | ICD-10-CM | POA: Diagnosis present

## 2022-12-04 DIAGNOSIS — M159 Polyosteoarthritis, unspecified: Secondary | ICD-10-CM | POA: Diagnosis present

## 2022-12-04 DIAGNOSIS — R7989 Other specified abnormal findings of blood chemistry: Secondary | ICD-10-CM | POA: Diagnosis not present

## 2022-12-04 DIAGNOSIS — M797 Fibromyalgia: Secondary | ICD-10-CM | POA: Diagnosis present

## 2022-12-04 DIAGNOSIS — J849 Interstitial pulmonary disease, unspecified: Secondary | ICD-10-CM | POA: Diagnosis not present

## 2022-12-04 DIAGNOSIS — E785 Hyperlipidemia, unspecified: Secondary | ICD-10-CM | POA: Diagnosis present

## 2022-12-04 DIAGNOSIS — Z6831 Body mass index (BMI) 31.0-31.9, adult: Secondary | ICD-10-CM | POA: Diagnosis not present

## 2022-12-04 DIAGNOSIS — Z87891 Personal history of nicotine dependence: Secondary | ICD-10-CM | POA: Diagnosis not present

## 2022-12-04 DIAGNOSIS — E119 Type 2 diabetes mellitus without complications: Secondary | ICD-10-CM | POA: Diagnosis present

## 2022-12-04 DIAGNOSIS — Z7984 Long term (current) use of oral hypoglycemic drugs: Secondary | ICD-10-CM | POA: Diagnosis not present

## 2022-12-04 DIAGNOSIS — G4733 Obstructive sleep apnea (adult) (pediatric): Secondary | ICD-10-CM | POA: Diagnosis present

## 2022-12-04 DIAGNOSIS — H409 Unspecified glaucoma: Secondary | ICD-10-CM | POA: Diagnosis present

## 2022-12-04 DIAGNOSIS — J189 Pneumonia, unspecified organism: Secondary | ICD-10-CM | POA: Diagnosis not present

## 2022-12-04 DIAGNOSIS — K219 Gastro-esophageal reflux disease without esophagitis: Secondary | ICD-10-CM | POA: Diagnosis present

## 2022-12-04 DIAGNOSIS — Z6830 Body mass index (BMI) 30.0-30.9, adult: Secondary | ICD-10-CM | POA: Diagnosis not present

## 2022-12-04 DIAGNOSIS — I110 Hypertensive heart disease with heart failure: Secondary | ICD-10-CM | POA: Diagnosis present

## 2022-12-04 DIAGNOSIS — Z833 Family history of diabetes mellitus: Secondary | ICD-10-CM | POA: Diagnosis not present

## 2022-12-04 DIAGNOSIS — J984 Other disorders of lung: Secondary | ICD-10-CM | POA: Diagnosis not present

## 2022-12-04 DIAGNOSIS — R911 Solitary pulmonary nodule: Secondary | ICD-10-CM | POA: Diagnosis present

## 2022-12-04 LAB — URINALYSIS, W/ REFLEX TO CULTURE (INFECTION SUSPECTED)
Bilirubin Urine: NEGATIVE
Glucose, UA: NEGATIVE mg/dL
Hgb urine dipstick: NEGATIVE
Ketones, ur: NEGATIVE mg/dL
Nitrite: NEGATIVE
Protein, ur: NEGATIVE mg/dL
Specific Gravity, Urine: 1.011 (ref 1.005–1.030)
pH: 5 (ref 5.0–8.0)

## 2022-12-04 LAB — CBC
HCT: 30.9 % — ABNORMAL LOW (ref 36.0–46.0)
Hemoglobin: 10.2 g/dL — ABNORMAL LOW (ref 12.0–15.0)
MCH: 28.2 pg (ref 26.0–34.0)
MCHC: 33 g/dL (ref 30.0–36.0)
MCV: 85.4 fL (ref 80.0–100.0)
Platelets: 161 10*3/uL (ref 150–400)
RBC: 3.62 MIL/uL — ABNORMAL LOW (ref 3.87–5.11)
RDW: 13.5 % (ref 11.5–15.5)
WBC: 5 10*3/uL (ref 4.0–10.5)
nRBC: 0 % (ref 0.0–0.2)

## 2022-12-04 LAB — COMPREHENSIVE METABOLIC PANEL
ALT: 10 U/L (ref 0–44)
AST: 15 U/L (ref 15–41)
Albumin: 3.4 g/dL — ABNORMAL LOW (ref 3.5–5.0)
Alkaline Phosphatase: 52 U/L (ref 38–126)
Anion gap: 9 (ref 5–15)
BUN: 11 mg/dL (ref 8–23)
CO2: 24 mmol/L (ref 22–32)
Calcium: 8.4 mg/dL — ABNORMAL LOW (ref 8.9–10.3)
Chloride: 102 mmol/L (ref 98–111)
Creatinine, Ser: 0.65 mg/dL (ref 0.44–1.00)
GFR, Estimated: >60 mL/min (ref >60)
Glucose, Bld: 109 mg/dL — ABNORMAL HIGH (ref 70–99)
Potassium: 3.6 mmol/L (ref 3.5–5.1)
Sodium: 135 mmol/L (ref 135–145)
Total Bilirubin: 0.3 mg/dL (ref 0.3–1.2)
Total Protein: 6.1 g/dL — ABNORMAL LOW (ref 6.5–8.1)

## 2022-12-04 LAB — GLUCOSE, CAPILLARY
Glucose-Capillary: 111 mg/dL — ABNORMAL HIGH (ref 70–99)
Glucose-Capillary: 99 mg/dL (ref 70–99)

## 2022-12-04 LAB — ECHOCARDIOGRAM COMPLETE
S' Lateral: 3 cm
Weight: 2786.61 [oz_av]

## 2022-12-04 MED ORDER — BRINZOLAMIDE 1 % OP SUSP
1.0000 [drp] | Freq: Three times a day (TID) | OPHTHALMIC | Status: DC
Start: 2022-12-04 — End: 2022-12-06
  Administered 2022-12-04 – 2022-12-06 (×7): 1 [drp] via OPHTHALMIC

## 2022-12-04 MED ORDER — BRIMONIDINE TARTRATE 0.15 % OP SOLN
1.0000 [drp] | Freq: Three times a day (TID) | OPHTHALMIC | Status: DC
  Administered 2022-12-04 – 2022-12-06 (×7): 1 [drp] via OPHTHALMIC

## 2022-12-04 MED ORDER — KETOROLAC TROMETHAMINE 0.5 % OP SOLN
1.0000 [drp] | Freq: Three times a day (TID) | OPHTHALMIC | Status: DC
  Administered 2022-12-04 – 2022-12-06 (×6): 1 [drp] via OPHTHALMIC

## 2022-12-04 MED ORDER — OXYCODONE-ACETAMINOPHEN 5-325 MG PO TABS
1.0000 | ORAL_TABLET | ORAL | Status: DC | PRN
  Administered 2022-12-04 (×2): 1 via ORAL
  Filled 2022-12-04 (×2): qty 1

## 2022-12-04 MED ORDER — POLYVINYL ALCOHOL-POVIDONE PF 1.4-0.6 % OP SOLN: 1.0000 [drp] | Freq: Three times a day (TID) | OPHTHALMIC | Status: DC | PRN

## 2022-12-04 NOTE — Care Management Obs Status (Signed)
MEDICARE OBSERVATION STATUS NOTIFICATION   Patient Details  Name: Sheila Reynolds MRN: [id number] Date of Birth: [birth year]   Medicare Observation Status Notification Given:  Yes    Otelia Santee, LCSW 12/04/2022, 11:36 AM

## 2022-12-04 NOTE — Progress Notes (Signed)
Bilateral lower extremity venous duplex has been completed. Preliminary results can be found in CV Proc through chart review.   12/04/22 9:07 AM Olen Cordial RVT

## 2022-12-04 NOTE — Consult Note (Signed)
Summary: RIGHT: - There is no evidence of deep vein thrombosis in the lower extremity.  - No cystic structure found in the popliteal fossa.  LEFT: - There is no evidence of deep vein thrombosis in the lower extremity. However, portions of this examination were limited- see technologist comments above.  - No cystic structure found in the popliteal fossa.  *See table(s) above for measurements and observations.    Preliminary    VAS US CAROTID  Result Date: 12/03/2022 Carotid Arterial Duplex Study Patient Name:  Sheila Reynolds [facility]  Date of Exam:   12/03/2022 Medical Rec #: [id number]           Accession #:    [id number] Date of Birth: 08-26-1945           Patient Gender: F Patient Age:   77 years Exam Location:  [facility] Procedure:      VAS US CAROTID Referring  Phys: Valente David --------------------------------------------------------------------------------  Indications:       Syncope. Risk Factors:      Hypertension, Diabetes, coronary artery disease. Comparison Study:  No significant changes seen since prior exam 10/29/18 Performing Technologist: Shona Simpson  Examination Guidelines: A complete evaluation includes B-mode imaging, spectral Doppler, color Doppler, and power Doppler as needed of all accessible portions of each vessel. Bilateral testing is considered an integral part of a complete examination. Limited examinations for reoccurring indications may be performed as noted.  Right Carotid Findings: +----------+--------+--------+--------+------------------+--------+           PSV cm/sEDV cm/sStenosisPlaque DescriptionComments +----------+--------+--------+--------+------------------+--------+ CCA Prox  136     20                                         +----------+--------+--------+--------+------------------+--------+ CCA Distal88      14                                         +----------+--------+--------+--------+------------------+--------+ ICA Prox  67      13      1-39%   heterogenous               +----------+--------+--------+--------+------------------+--------+ ICA Mid   84      22                                         +----------+--------+--------+--------+------------------+--------+ ICA Distal111     36                                         +----------+--------+--------+--------+------------------+--------+ ECA       79      9                                          +----------+--------+--------+--------+------------------+--------+ +----------+--------+-------+--------+-------------------+           PSV cm/sEDV cmsDescribeArm Pressure (mmHG) +----------+--------+-------+--------+-------------------+ [id number]      0                                   +----------+--------+-------+--------+-------------------+ +---------+--------+--+--------+--+  Summary: RIGHT: - There is no evidence of deep vein thrombosis in the lower extremity.  - No cystic structure found in the popliteal fossa.  LEFT: - There is no evidence of deep vein thrombosis in the lower extremity. However, portions of this examination were limited- see technologist comments above.  - No cystic structure found in the popliteal fossa.  *See table(s) above for measurements and observations.    Preliminary    VAS US CAROTID  Result Date: 12/03/2022 Carotid Arterial Duplex Study Patient Name:  Sheila Reynolds [facility]  Date of Exam:   12/03/2022 Medical Rec #: [id number]           Accession #:    [id number] Date of Birth: 08-26-1945           Patient Gender: F Patient Age:   77 years Exam Location:  [facility] Procedure:      VAS US CAROTID Referring  Phys: Valente David --------------------------------------------------------------------------------  Indications:       Syncope. Risk Factors:      Hypertension, Diabetes, coronary artery disease. Comparison Study:  No significant changes seen since prior exam 10/29/18 Performing Technologist: Shona Simpson  Examination Guidelines: A complete evaluation includes B-mode imaging, spectral Doppler, color Doppler, and power Doppler as needed of all accessible portions of each vessel. Bilateral testing is considered an integral part of a complete examination. Limited examinations for reoccurring indications may be performed as noted.  Right Carotid Findings: +----------+--------+--------+--------+------------------+--------+           PSV cm/sEDV cm/sStenosisPlaque DescriptionComments +----------+--------+--------+--------+------------------+--------+ CCA Prox  136     20                                         +----------+--------+--------+--------+------------------+--------+ CCA Distal88      14                                         +----------+--------+--------+--------+------------------+--------+ ICA Prox  67      13      1-39%   heterogenous               +----------+--------+--------+--------+------------------+--------+ ICA Mid   84      22                                         +----------+--------+--------+--------+------------------+--------+ ICA Distal111     36                                         +----------+--------+--------+--------+------------------+--------+ ECA       79      9                                          +----------+--------+--------+--------+------------------+--------+ +----------+--------+-------+--------+-------------------+           PSV cm/sEDV cmsDescribeArm Pressure (mmHG) +----------+--------+-------+--------+-------------------+ [id number]      0                                   +----------+--------+-------+--------+-------------------+ +---------+--------+--+--------+--+  Summary: RIGHT: - There is no evidence of deep vein thrombosis in the lower extremity.  - No cystic structure found in the popliteal fossa.  LEFT: - There is no evidence of deep vein thrombosis in the lower extremity. However, portions of this examination were limited- see technologist comments above.  - No cystic structure found in the popliteal fossa.  *See table(s) above for measurements and observations.    Preliminary    VAS US CAROTID  Result Date: 12/03/2022 Carotid Arterial Duplex Study Patient Name:  Sheila Reynolds [facility]  Date of Exam:   12/03/2022 Medical Rec #: [id number]           Accession #:    [id number] Date of Birth: 08-26-1945           Patient Gender: F Patient Age:   77 years Exam Location:  [facility] Procedure:      VAS US CAROTID Referring  Phys: Valente David --------------------------------------------------------------------------------  Indications:       Syncope. Risk Factors:      Hypertension, Diabetes, coronary artery disease. Comparison Study:  No significant changes seen since prior exam 10/29/18 Performing Technologist: Shona Simpson  Examination Guidelines: A complete evaluation includes B-mode imaging, spectral Doppler, color Doppler, and power Doppler as needed of all accessible portions of each vessel. Bilateral testing is considered an integral part of a complete examination. Limited examinations for reoccurring indications may be performed as noted.  Right Carotid Findings: +----------+--------+--------+--------+------------------+--------+           PSV cm/sEDV cm/sStenosisPlaque DescriptionComments +----------+--------+--------+--------+------------------+--------+ CCA Prox  136     20                                         +----------+--------+--------+--------+------------------+--------+ CCA Distal88      14                                         +----------+--------+--------+--------+------------------+--------+ ICA Prox  67      13      1-39%   heterogenous               +----------+--------+--------+--------+------------------+--------+ ICA Mid   84      22                                         +----------+--------+--------+--------+------------------+--------+ ICA Distal111     36                                         +----------+--------+--------+--------+------------------+--------+ ECA       79      9                                          +----------+--------+--------+--------+------------------+--------+ +----------+--------+-------+--------+-------------------+           PSV cm/sEDV cmsDescribeArm Pressure (mmHG) +----------+--------+-------+--------+-------------------+ [id number]      0                                   +----------+--------+-------+--------+-------------------+ +---------+--------+--+--------+--+  NAME:  Sheila Reynolds, MRN:  [id number], DOB:  28-May-1945, LOS: 0 ADMISSION DATE:  12/02/2022, CONSULTATION DATE:  12/03/2022 REFERRING MD:  Dr. Arville Care, CHIEF COMPLAINT:  Cavitary lesion   History of Present Illness:  77 year old female with PMH as below, which is significant for COPD, bronchiolitis, sarcoidosis, OSA on CPAP, DM, and CAD. She has been undergoing surveillance imaging of some pulmonary nodules including a RUL nodule by Dr. Maple Hudson in the pulmonary clinic. She was in her usual state of health 10/22 when she was eating dinner and suffered a syncopal episode. She woke up under the table and believed she struck her head. Upon arrival to the ED she complained of back pain. She was completely awake and responsive as well as hemodynamically stable. Trama workup was negative. CTA of the chest was done to investigate for PE. This was negative for PE, but did describe a new spiculated cavitary lesion in the right upper lobe. She was admitted to the hospitalist service and PCCM was asked to evaluate for cavitary lesion.   On my evaluation she denies SOB, fever, chills, night sweats, productive cough, hemoptysis. Denies sick contacts, foreign travel, contact with foreigners, contact with prisoners. She endorses an unintentional 10 lb weight loss over the past few months.   Reports at baseline she is able to ambulate around her house OK. Stairs and walking to the mailbox (80 ft) are limited by dyspnea. She also described CPAP non-compliance.   Pertinent  Medical History   has a past medical history of Anemia, Anxiety, Arthritis, Asthma, Bronchitis with emphysema, Chest pain, Chronic bronchitis (HCC), Coronary artery disease, Family history of anesthesia complication, Fibromyalgia, GERD (gastroesophageal reflux disease), Glaucoma, Heart murmur, History of hiatal hernia, echocardiogram, Hyperlipidemia, Hypertension, Nonsustained ventricular tachycardia (HCC), OSA on CPAP, Premature atrial  contractions, PVC (premature ventricular contraction), Sarcoidosis, and Type II diabetes mellitus (HCC).   Significant Hospital Events: Including procedures, antibiotic start and stop dates in addition to other pertinent events   10/24 - denied complaints . Wants to get better. Aware pulm is recommending biopsy  Interim History / Subjective:    Objective   Blood pressure 119/61, pulse 80, temperature 99.1 F (37.3 C), temperature source Oral, resp. rate 18, weight 79 kg, SpO2 99%.        Intake/Output Summary (Last 24 hours) at 12/04/2022 1138 Last data filed at 12/04/2022 1015 Gross per 24 hour  Intake 700 ml  Output 300 ml  Net 400 ml   Filed Weights   12/04/22 0143  Weight: 79 kg    Examination: General: No distress. Chronic unwell l. Flast affec Neuro: Alert and Oriented x 3. GCS 15. Speech normal Psych: Pleasant Resp:  Barrel Chest - no.  Wheeze - n, Crackles - YES R > L base, No overt respiratory distress CVS: Normal heart sounds. Murmurs - no Ext: Stigmata of Connective Tissue Disease - no HEENT: Normal upper airway. PEERL +. No post nasal drip   Resolved Hospital Problem list     Assessment & Plan:   Cavitary lesion of the right upper lobe: differential includes infection and malignancy. She has no infectious symptoms, which does not totally rule out atypical infection. Concern here is for malignancy because she has had a nodule in this location dating back at least to January. Unintentional weight loss as well. No risk factors or symptoms consistent with TB. Sputum cultures from June all negative including AFB.    PLAN -ok for ceftriaxone/flagyl for now  - but  Summary: RIGHT: - There is no evidence of deep vein thrombosis in the lower extremity.  - No cystic structure found in the popliteal fossa.  LEFT: - There is no evidence of deep vein thrombosis in the lower extremity. However, portions of this examination were limited- see technologist comments above.  - No cystic structure found in the popliteal fossa.  *See table(s) above for measurements and observations.    Preliminary    VAS US CAROTID  Result Date: 12/03/2022 Carotid Arterial Duplex Study Patient Name:  Sheila Reynolds [facility]  Date of Exam:   12/03/2022 Medical Rec #: [id number]           Accession #:    [id number] Date of Birth: 08-26-1945           Patient Gender: F Patient Age:   77 years Exam Location:  [facility] Procedure:      VAS US CAROTID Referring  Phys: Valente David --------------------------------------------------------------------------------  Indications:       Syncope. Risk Factors:      Hypertension, Diabetes, coronary artery disease. Comparison Study:  No significant changes seen since prior exam 10/29/18 Performing Technologist: Shona Simpson  Examination Guidelines: A complete evaluation includes B-mode imaging, spectral Doppler, color Doppler, and power Doppler as needed of all accessible portions of each vessel. Bilateral testing is considered an integral part of a complete examination. Limited examinations for reoccurring indications may be performed as noted.  Right Carotid Findings: +----------+--------+--------+--------+------------------+--------+           PSV cm/sEDV cm/sStenosisPlaque DescriptionComments +----------+--------+--------+--------+------------------+--------+ CCA Prox  136     20                                         +----------+--------+--------+--------+------------------+--------+ CCA Distal88      14                                         +----------+--------+--------+--------+------------------+--------+ ICA Prox  67      13      1-39%   heterogenous               +----------+--------+--------+--------+------------------+--------+ ICA Mid   84      22                                         +----------+--------+--------+--------+------------------+--------+ ICA Distal111     36                                         +----------+--------+--------+--------+------------------+--------+ ECA       79      9                                          +----------+--------+--------+--------+------------------+--------+ +----------+--------+-------+--------+-------------------+           PSV cm/sEDV cmsDescribeArm Pressure (mmHG) +----------+--------+-------+--------+-------------------+ [id number]      0                                   +----------+--------+-------+--------+-------------------+ +---------+--------+--+--------+--+  NAME:  Sheila Reynolds, MRN:  [id number], DOB:  28-May-1945, LOS: 0 ADMISSION DATE:  12/02/2022, CONSULTATION DATE:  12/03/2022 REFERRING MD:  Dr. Arville Care, CHIEF COMPLAINT:  Cavitary lesion   History of Present Illness:  77 year old female with PMH as below, which is significant for COPD, bronchiolitis, sarcoidosis, OSA on CPAP, DM, and CAD. She has been undergoing surveillance imaging of some pulmonary nodules including a RUL nodule by Dr. Maple Hudson in the pulmonary clinic. She was in her usual state of health 10/22 when she was eating dinner and suffered a syncopal episode. She woke up under the table and believed she struck her head. Upon arrival to the ED she complained of back pain. She was completely awake and responsive as well as hemodynamically stable. Trama workup was negative. CTA of the chest was done to investigate for PE. This was negative for PE, but did describe a new spiculated cavitary lesion in the right upper lobe. She was admitted to the hospitalist service and PCCM was asked to evaluate for cavitary lesion.   On my evaluation she denies SOB, fever, chills, night sweats, productive cough, hemoptysis. Denies sick contacts, foreign travel, contact with foreigners, contact with prisoners. She endorses an unintentional 10 lb weight loss over the past few months.   Reports at baseline she is able to ambulate around her house OK. Stairs and walking to the mailbox (80 ft) are limited by dyspnea. She also described CPAP non-compliance.   Pertinent  Medical History   has a past medical history of Anemia, Anxiety, Arthritis, Asthma, Bronchitis with emphysema, Chest pain, Chronic bronchitis (HCC), Coronary artery disease, Family history of anesthesia complication, Fibromyalgia, GERD (gastroesophageal reflux disease), Glaucoma, Heart murmur, History of hiatal hernia, echocardiogram, Hyperlipidemia, Hypertension, Nonsustained ventricular tachycardia (HCC), OSA on CPAP, Premature atrial  contractions, PVC (premature ventricular contraction), Sarcoidosis, and Type II diabetes mellitus (HCC).   Significant Hospital Events: Including procedures, antibiotic start and stop dates in addition to other pertinent events   10/24 - denied complaints . Wants to get better. Aware pulm is recommending biopsy  Interim History / Subjective:    Objective   Blood pressure 119/61, pulse 80, temperature 99.1 F (37.3 C), temperature source Oral, resp. rate 18, weight 79 kg, SpO2 99%.        Intake/Output Summary (Last 24 hours) at 12/04/2022 1138 Last data filed at 12/04/2022 1015 Gross per 24 hour  Intake 700 ml  Output 300 ml  Net 400 ml   Filed Weights   12/04/22 0143  Weight: 79 kg    Examination: General: No distress. Chronic unwell l. Flast affec Neuro: Alert and Oriented x 3. GCS 15. Speech normal Psych: Pleasant Resp:  Barrel Chest - no.  Wheeze - n, Crackles - YES R > L base, No overt respiratory distress CVS: Normal heart sounds. Murmurs - no Ext: Stigmata of Connective Tissue Disease - no HEENT: Normal upper airway. PEERL +. No post nasal drip   Resolved Hospital Problem list     Assessment & Plan:   Cavitary lesion of the right upper lobe: differential includes infection and malignancy. She has no infectious symptoms, which does not totally rule out atypical infection. Concern here is for malignancy because she has had a nodule in this location dating back at least to January. Unintentional weight loss as well. No risk factors or symptoms consistent with TB. Sputum cultures from June all negative including AFB.    PLAN -ok for ceftriaxone/flagyl for now  - but  [id number] Date of Birth: 1946/01/26           Patient Gender: F Patient Age:   17 years Exam Location:  [facility] Procedure:      VAS [place] LOWER EXTREMITY VENOUS (DVT) Referring Phys: RIPUDEEP RAI --------------------------------------------------------------------------------  Indications: Elevated Ddimer.  Risk Factors: None identified. Limitations: Poor ultrasound/tissue interface. Comparison Study: No prior studies. Performing Technologist: Chanda Busing RVT  Examination Guidelines: A complete evaluation includes B-mode imaging, spectral Doppler, color Doppler, and power Doppler as needed of all accessible portions of each vessel. Bilateral testing is considered an integral part of a complete examination. Limited examinations for reoccurring indications may be performed as noted. The reflux portion of the exam is performed with the patient in reverse Trendelenburg.  +---------+---------------+---------+-----------+----------+--------------+ RIGHT     CompressibilityPhasicitySpontaneityPropertiesThrombus Aging +---------+---------------+---------+-----------+----------+--------------+ CFV      Full           Yes      Yes                                 +---------+---------------+---------+-----------+----------+--------------+ SFJ      Full                                                        +---------+---------------+---------+-----------+----------+--------------+ FV Prox  Full                                                        +---------+---------------+---------+-----------+----------+--------------+ FV Mid   Full                                                        +---------+---------------+---------+-----------+----------+--------------+ FV DistalFull                                                        +---------+---------------+---------+-----------+----------+--------------+ PFV      Full                                                        +---------+---------------+---------+-----------+----------+--------------+ POP      Full           Yes      Yes                                 +---------+---------------+---------+-----------+----------+--------------+ PTV      Full                                                        +---------+---------------+---------+-----------+----------+--------------+  [id number] Date of Birth: 1946/01/26           Patient Gender: F Patient Age:   17 years Exam Location:  [facility] Procedure:      VAS [place] LOWER EXTREMITY VENOUS (DVT) Referring Phys: RIPUDEEP RAI --------------------------------------------------------------------------------  Indications: Elevated Ddimer.  Risk Factors: None identified. Limitations: Poor ultrasound/tissue interface. Comparison Study: No prior studies. Performing Technologist: Chanda Busing RVT  Examination Guidelines: A complete evaluation includes B-mode imaging, spectral Doppler, color Doppler, and power Doppler as needed of all accessible portions of each vessel. Bilateral testing is considered an integral part of a complete examination. Limited examinations for reoccurring indications may be performed as noted. The reflux portion of the exam is performed with the patient in reverse Trendelenburg.  +---------+---------------+---------+-----------+----------+--------------+ RIGHT     CompressibilityPhasicitySpontaneityPropertiesThrombus Aging +---------+---------------+---------+-----------+----------+--------------+ CFV      Full           Yes      Yes                                 +---------+---------------+---------+-----------+----------+--------------+ SFJ      Full                                                        +---------+---------------+---------+-----------+----------+--------------+ FV Prox  Full                                                        +---------+---------------+---------+-----------+----------+--------------+ FV Mid   Full                                                        +---------+---------------+---------+-----------+----------+--------------+ FV DistalFull                                                        +---------+---------------+---------+-----------+----------+--------------+ PFV      Full                                                        +---------+---------------+---------+-----------+----------+--------------+ POP      Full           Yes      Yes                                 +---------+---------------+---------+-----------+----------+--------------+ PTV      Full                                                        +---------+---------------+---------+-----------+----------+--------------+

## 2022-12-04 NOTE — Progress Notes (Signed)
   12/04/22 1143  TOC Brief Assessment  Insurance and Status Reviewed  Patient has primary care physician Yes  Home environment has been reviewed Single family home w/ spouse  Prior level of function: Independent/modified independent  Prior/Current Home Services No current home services  Social Determinants of Health Reivew SDOH reviewed no interventions necessary  Readmission risk has been reviewed Yes  Transition of care needs no transition of care needs at this time

## 2022-12-04 NOTE — Plan of Care (Signed)
  Problem: Education: Goal: Knowledge of General Education information will improve Description: Including pain rating scale, medication(s)/side effects and non-pharmacologic comfort measures Outcome: Progressing   Problem: Health Behavior/Discharge Planning: Goal: Ability to manage health-related needs will improve Outcome: Progressing   Problem: Clinical Measurements: Goal: Ability to maintain clinical measurements within normal limits will improve Outcome: Progressing Goal: Will remain free from infection Outcome: Progressing Goal: Diagnostic test results will improve Outcome: Progressing Goal: Respiratory complications will improve Outcome: Progressing Goal: Cardiovascular complication will be avoided Outcome: Progressing   Problem: Activity: Goal: Risk for activity intolerance will decrease Outcome: Progressing   Problem: Nutrition: Goal: Adequate nutrition will be maintained Outcome: Progressing   Problem: Coping: Goal: Level of anxiety will decrease Outcome: Progressing   Problem: Elimination: Goal: Will not experience complications related to bowel motility Outcome: Progressing Goal: Will not experience complications related to urinary retention Outcome: Progressing   Problem: Pain Management: Goal: General experience of comfort will improve Outcome: Progressing   Problem: Safety: Goal: Ability to remain free from injury will improve Outcome: Progressing   Problem: Skin Integrity: Goal: Risk for impaired skin integrity will decrease Outcome: Progressing   Problem: Education: Goal: Knowledge of condition and prescribed therapy will improve Outcome: Progressing   Problem: Cardiac: Goal: Will achieve and/or maintain adequate cardiac output Outcome: Progressing   Problem: Physical Regulation: Goal: Complications related to the disease process, condition or treatment will be avoided or minimized Outcome: Progressing

## 2022-12-04 NOTE — Progress Notes (Signed)
EOSABS 0.3 11/07/2022   BASOSABS 0.0 11/07/2022     Last metabolic panel Lab Results  Component Value Date   NA 135 12/04/2022   K 3.6 12/04/2022   CL 102 12/04/2022   CO2 24 12/04/2022   BUN 11 12/04/2022   CREATININE 0.65 12/04/2022   GLUCOSE 109 (H) 12/04/2022   GFRNONAA >60 12/04/2022   GFRAA 91 03/28/2020   CALCIUM 8.4 (L) 12/04/2022   PROT 6.1 (L) 12/04/2022   ALBUMIN 3.4 (L) 12/04/2022   LABGLOB 2.2 09/22/2022   AGRATIO 1.7 06/13/2021   BILITOT 0.3 12/04/2022   ALKPHOS 52 12/04/2022   AST 15 12/04/2022   ALT 10 12/04/2022   ANIONGAP 9 12/04/2022    CBG (last 3)  Recent Labs    12/03/22 1112 12/03/22 1757 12/04/22 0609  GLUCAP 161* 125* 111*      Coagulation Profile: No results for input(s): "INR", "PROTIME" in the last 168 hours.   Radiology Studies: I have personally reviewed the imaging studies  VAS [place] LOWER EXTREMITY VENOUS (DVT)  Result Date: 12/04/2022  Lower Venous DVT Study Patient Name:  KAYAH CHINO [facility]  Date of Exam:   12/04/2022 Medical Rec #: [id number]           Accession #:    [id number] Date of Birth: 1945/10/23           Patient Gender: F Patient Age:   77 years Exam Location:  [facility] Procedure:      VAS [place] LOWER EXTREMITY VENOUS (DVT) Referring Phys: Robby Pirani --------------------------------------------------------------------------------  Indications: Elevated Ddimer.  Risk Factors: None identified. Limitations: Poor ultrasound/tissue interface. Comparison Study: No prior studies. Performing Technologist: Chanda Busing RVT  Examination Guidelines: A complete evaluation includes B-mode imaging, spectral Doppler, color Doppler, and power Doppler as needed of all accessible portions of each vessel. Bilateral testing is  considered an integral part of a complete examination. Limited examinations for reoccurring indications may be performed as noted. The reflux portion of the exam is performed with the patient in reverse Trendelenburg.  +---------+---------------+---------+-----------+----------+--------------+ RIGHT    CompressibilityPhasicitySpontaneityPropertiesThrombus Aging +---------+---------------+---------+-----------+----------+--------------+ CFV      Full           Yes      Yes                                 +---------+---------------+---------+-----------+----------+--------------+ SFJ      Full                                                        +---------+---------------+---------+-----------+----------+--------------+ FV Prox  Full                                                        +---------+---------------+---------+-----------+----------+--------------+ FV Mid   Full                                                        +---------+---------------+---------+-----------+----------+--------------+  PFV      Full                                                        +---------+---------------+---------+-----------+----------+--------------+ POP      Full           Yes      Yes                                 +---------+---------------+---------+-----------+----------+--------------+ PTV      Full                                                        +---------+---------------+---------+-----------+----------+--------------+ PERO     Full                                                        +---------+---------------+---------+-----------+----------+--------------+    Summary: RIGHT: - There is no evidence of deep vein thrombosis in the lower extremity.  - No cystic structure found in the popliteal fossa.  LEFT: - There is no evidence of deep vein thrombosis in the lower extremity. However, portions of this examination were limited- see technologist comments above.  - No cystic structure found in the popliteal fossa.  *See table(s) above for measurements and observations.    Preliminary    VAS  US CAROTID  Result Date: 12/03/2022 Carotid Arterial Duplex Study Patient Name:  MAREESA NARA [facility]  Date of Exam:   12/03/2022 Medical Rec #: [id number]           Accession #:    [id number] Date of Birth: [birth year]           Patient Gender: F Patient Age:   77 years Exam Location:  [facility] Procedure:      VAS US CAROTID Referring Phys: Valente David --------------------------------------------------------------------------------  Indications:       Syncope. Risk Factors:      Hypertension, Diabetes, coronary artery disease. Comparison Study:  No significant changes seen since prior exam 10/29/18 Performing Technologist: Shona Simpson  Examination Guidelines: A complete evaluation includes B-mode imaging, spectral Doppler, color Doppler, and power Doppler as needed of all accessible portions of each vessel. Bilateral testing is considered an integral part of a complete examination. Limited examinations for reoccurring indications may be performed as noted.  Right Carotid Findings: +----------+--------+--------+--------+------------------+--------+           PSV cm/sEDV cm/sStenosisPlaque DescriptionComments +----------+--------+--------+--------+------------------+--------+ CCA Prox  136     20                                         +----------+--------+--------+--------+------------------+--------+ CCA Distal88      14                                         +----------+--------+--------+--------+------------------+--------+  EOSABS 0.3 11/07/2022   BASOSABS 0.0 11/07/2022     Last metabolic panel Lab Results  Component Value Date   NA 135 12/04/2022   K 3.6 12/04/2022   CL 102 12/04/2022   CO2 24 12/04/2022   BUN 11 12/04/2022   CREATININE 0.65 12/04/2022   GLUCOSE 109 (H) 12/04/2022   GFRNONAA >60 12/04/2022   GFRAA 91 03/28/2020   CALCIUM 8.4 (L) 12/04/2022   PROT 6.1 (L) 12/04/2022   ALBUMIN 3.4 (L) 12/04/2022   LABGLOB 2.2 09/22/2022   AGRATIO 1.7 06/13/2021   BILITOT 0.3 12/04/2022   ALKPHOS 52 12/04/2022   AST 15 12/04/2022   ALT 10 12/04/2022   ANIONGAP 9 12/04/2022    CBG (last 3)  Recent Labs    12/03/22 1112 12/03/22 1757 12/04/22 0609  GLUCAP 161* 125* 111*      Coagulation Profile: No results for input(s): "INR", "PROTIME" in the last 168 hours.   Radiology Studies: I have personally reviewed the imaging studies  VAS [place] LOWER EXTREMITY VENOUS (DVT)  Result Date: 12/04/2022  Lower Venous DVT Study Patient Name:  KAYAH CHINO [facility]  Date of Exam:   12/04/2022 Medical Rec #: [id number]           Accession #:    [id number] Date of Birth: 1945/10/23           Patient Gender: F Patient Age:   77 years Exam Location:  [facility] Procedure:      VAS [place] LOWER EXTREMITY VENOUS (DVT) Referring Phys: Robby Pirani --------------------------------------------------------------------------------  Indications: Elevated Ddimer.  Risk Factors: None identified. Limitations: Poor ultrasound/tissue interface. Comparison Study: No prior studies. Performing Technologist: Chanda Busing RVT  Examination Guidelines: A complete evaluation includes B-mode imaging, spectral Doppler, color Doppler, and power Doppler as needed of all accessible portions of each vessel. Bilateral testing is  considered an integral part of a complete examination. Limited examinations for reoccurring indications may be performed as noted. The reflux portion of the exam is performed with the patient in reverse Trendelenburg.  +---------+---------------+---------+-----------+----------+--------------+ RIGHT    CompressibilityPhasicitySpontaneityPropertiesThrombus Aging +---------+---------------+---------+-----------+----------+--------------+ CFV      Full           Yes      Yes                                 +---------+---------------+---------+-----------+----------+--------------+ SFJ      Full                                                        +---------+---------------+---------+-----------+----------+--------------+ FV Prox  Full                                                        +---------+---------------+---------+-----------+----------+--------------+ FV Mid   Full                                                        +---------+---------------+---------+-----------+----------+--------------+  PFV      Full                                                        +---------+---------------+---------+-----------+----------+--------------+ POP      Full           Yes      Yes                                 +---------+---------------+---------+-----------+----------+--------------+ PTV      Full                                                        +---------+---------------+---------+-----------+----------+--------------+ PERO     Full                                                        +---------+---------------+---------+-----------+----------+--------------+    Summary: RIGHT: - There is no evidence of deep vein thrombosis in the lower extremity.  - No cystic structure found in the popliteal fossa.  LEFT: - There is no evidence of deep vein thrombosis in the lower extremity. However, portions of this examination were limited- see technologist comments above.  - No cystic structure found in the popliteal fossa.  *See table(s) above for measurements and observations.    Preliminary    VAS  US CAROTID  Result Date: 12/03/2022 Carotid Arterial Duplex Study Patient Name:  MAREESA NARA [facility]  Date of Exam:   12/03/2022 Medical Rec #: [id number]           Accession #:    [id number] Date of Birth: [birth year]           Patient Gender: F Patient Age:   77 years Exam Location:  [facility] Procedure:      VAS US CAROTID Referring Phys: Valente David --------------------------------------------------------------------------------  Indications:       Syncope. Risk Factors:      Hypertension, Diabetes, coronary artery disease. Comparison Study:  No significant changes seen since prior exam 10/29/18 Performing Technologist: Shona Simpson  Examination Guidelines: A complete evaluation includes B-mode imaging, spectral Doppler, color Doppler, and power Doppler as needed of all accessible portions of each vessel. Bilateral testing is considered an integral part of a complete examination. Limited examinations for reoccurring indications may be performed as noted.  Right Carotid Findings: +----------+--------+--------+--------+------------------+--------+           PSV cm/sEDV cm/sStenosisPlaque DescriptionComments +----------+--------+--------+--------+------------------+--------+ CCA Prox  136     20                                         +----------+--------+--------+--------+------------------+--------+ CCA Distal88      14                                         +----------+--------+--------+--------+------------------+--------+  [facility]                                                                              Ivelisse Bogin, is a 77 y.o. female, DOB - 1945-06-15, WUJ:[id number] Admit date - 12/02/2022    Outpatient Primary MD for the patient is Dorothyann Peng, MD  LOS - 0  days  No chief complaint on file.      Brief summary   Patient is a 77 year old female with diabetes mellitus type 2, HTN, HLP, NSVT, CAD, GERD, sarcoidosis, OSA on CPAP presented with syncope and head trauma.  Patient was eating dinner when she got lightheaded and then suddenly passed out.  She could not remember if she was seated or standing at the time as she occasionally eats while standing.  She also believes she struck the back of her head with the chair as it was sore.  Unclear how long she was unconscious.  No tongue biting or urinary incontinence.  She admitted to having a headache but was able to ambulate after this event.  She has chronic right hip pain that is slightly worse and mid back pain.  She admitted to increased urinary frequency and urgency without dysuria or oliguria or hematuria.  No flank pain.  No chest pain or palpitations.  In ED, vital signs were stable, UA positive for UTI.  Hemoglobin 11.6, D-dimer elevated at 4.25. CTA chest showed no PE however showed spiculated and cavitary nodule in the right upper lung that appears to be slowly growing since the prior studies.   Assessment & Plan    Principal Problem:   Syncope -Unclear cause, could be vasovagal, orthostatic or arrhythmia -Follow 2D echo, carotid Dopplers   Active Problems: Headache -CT head negative for any bleeding -CT C-spine, CT T-spine negative for any fracture dislocation -Continue pain control as needed    Acute lower UTI -Follow urine culture and sensitivities, follow blood cultures, continue IV Rocephin    Cavitating mass in right upper lung lobe -CT chest showed no PE however spiculated, cavitary nodule in the  right upper lung, slowly growing since the prior studies.  Patient has underlying history of sarcoidosis although reported that it has been in remission for many years -Per pulm, continue empiric antibiotic coverage for now at least 2 to 3 weeks -Outpatient follow-up and consideration for navigational bronchoscopy and PET scan    Dyslipidemia,  Coronary artery disease -Continue aspirin, Plavix, Imdur, beta-blocker, statin -Patient follows cardiology, Dr. Elease Hashimoto outpatient    Chronic obstructive pulmonary disease (COPD) (HCC) -Currently no acute wheezing, continue inhalers  Chronic lymphedema bilateral LE -Venous Dopplers negative for any DVT -Recommended continue elevating lower extremities, compression stockings, diuretics likely not going to help  Diabetes mellitus type 2, NIDDM -Continue linagliptin, metformin  Obesity Estimated body mass index is 31.85 kg/m as calculated from the following:   Height as of 11/24/22: 5\' 2"  (1.575 m).   Weight as of this encounter: 79 kg.  Code Status: Full CODE STATUS DVT Prophylaxis:  enoxaparin (LOVENOX) injection 40 mg Start: 12/03/22 2200   Level of Care: Level of care: Telemetry Family Communication: Updated patient Disposition  EOSABS 0.3 11/07/2022   BASOSABS 0.0 11/07/2022     Last metabolic panel Lab Results  Component Value Date   NA 135 12/04/2022   K 3.6 12/04/2022   CL 102 12/04/2022   CO2 24 12/04/2022   BUN 11 12/04/2022   CREATININE 0.65 12/04/2022   GLUCOSE 109 (H) 12/04/2022   GFRNONAA >60 12/04/2022   GFRAA 91 03/28/2020   CALCIUM 8.4 (L) 12/04/2022   PROT 6.1 (L) 12/04/2022   ALBUMIN 3.4 (L) 12/04/2022   LABGLOB 2.2 09/22/2022   AGRATIO 1.7 06/13/2021   BILITOT 0.3 12/04/2022   ALKPHOS 52 12/04/2022   AST 15 12/04/2022   ALT 10 12/04/2022   ANIONGAP 9 12/04/2022    CBG (last 3)  Recent Labs    12/03/22 1112 12/03/22 1757 12/04/22 0609  GLUCAP 161* 125* 111*      Coagulation Profile: No results for input(s): "INR", "PROTIME" in the last 168 hours.   Radiology Studies: I have personally reviewed the imaging studies  VAS [place] LOWER EXTREMITY VENOUS (DVT)  Result Date: 12/04/2022  Lower Venous DVT Study Patient Name:  KAYAH CHINO [facility]  Date of Exam:   12/04/2022 Medical Rec #: [id number]           Accession #:    [id number] Date of Birth: 1945/10/23           Patient Gender: F Patient Age:   77 years Exam Location:  [facility] Procedure:      VAS [place] LOWER EXTREMITY VENOUS (DVT) Referring Phys: Robby Pirani --------------------------------------------------------------------------------  Indications: Elevated Ddimer.  Risk Factors: None identified. Limitations: Poor ultrasound/tissue interface. Comparison Study: No prior studies. Performing Technologist: Chanda Busing RVT  Examination Guidelines: A complete evaluation includes B-mode imaging, spectral Doppler, color Doppler, and power Doppler as needed of all accessible portions of each vessel. Bilateral testing is  considered an integral part of a complete examination. Limited examinations for reoccurring indications may be performed as noted. The reflux portion of the exam is performed with the patient in reverse Trendelenburg.  +---------+---------------+---------+-----------+----------+--------------+ RIGHT    CompressibilityPhasicitySpontaneityPropertiesThrombus Aging +---------+---------------+---------+-----------+----------+--------------+ CFV      Full           Yes      Yes                                 +---------+---------------+---------+-----------+----------+--------------+ SFJ      Full                                                        +---------+---------------+---------+-----------+----------+--------------+ FV Prox  Full                                                        +---------+---------------+---------+-----------+----------+--------------+ FV Mid   Full                                                        +---------+---------------+---------+-----------+----------+--------------+  [facility]                                                                              Ivelisse Bogin, is a 77 y.o. female, DOB - 1945-06-15, WUJ:[id number] Admit date - 12/02/2022    Outpatient Primary MD for the patient is Dorothyann Peng, MD  LOS - 0  days  No chief complaint on file.      Brief summary   Patient is a 77 year old female with diabetes mellitus type 2, HTN, HLP, NSVT, CAD, GERD, sarcoidosis, OSA on CPAP presented with syncope and head trauma.  Patient was eating dinner when she got lightheaded and then suddenly passed out.  She could not remember if she was seated or standing at the time as she occasionally eats while standing.  She also believes she struck the back of her head with the chair as it was sore.  Unclear how long she was unconscious.  No tongue biting or urinary incontinence.  She admitted to having a headache but was able to ambulate after this event.  She has chronic right hip pain that is slightly worse and mid back pain.  She admitted to increased urinary frequency and urgency without dysuria or oliguria or hematuria.  No flank pain.  No chest pain or palpitations.  In ED, vital signs were stable, UA positive for UTI.  Hemoglobin 11.6, D-dimer elevated at 4.25. CTA chest showed no PE however showed spiculated and cavitary nodule in the right upper lung that appears to be slowly growing since the prior studies.   Assessment & Plan    Principal Problem:   Syncope -Unclear cause, could be vasovagal, orthostatic or arrhythmia -Follow 2D echo, carotid Dopplers   Active Problems: Headache -CT head negative for any bleeding -CT C-spine, CT T-spine negative for any fracture dislocation -Continue pain control as needed    Acute lower UTI -Follow urine culture and sensitivities, follow blood cultures, continue IV Rocephin    Cavitating mass in right upper lung lobe -CT chest showed no PE however spiculated, cavitary nodule in the  right upper lung, slowly growing since the prior studies.  Patient has underlying history of sarcoidosis although reported that it has been in remission for many years -Per pulm, continue empiric antibiotic coverage for now at least 2 to 3 weeks -Outpatient follow-up and consideration for navigational bronchoscopy and PET scan    Dyslipidemia,  Coronary artery disease -Continue aspirin, Plavix, Imdur, beta-blocker, statin -Patient follows cardiology, Dr. Elease Hashimoto outpatient    Chronic obstructive pulmonary disease (COPD) (HCC) -Currently no acute wheezing, continue inhalers  Chronic lymphedema bilateral LE -Venous Dopplers negative for any DVT -Recommended continue elevating lower extremities, compression stockings, diuretics likely not going to help  Diabetes mellitus type 2, NIDDM -Continue linagliptin, metformin  Obesity Estimated body mass index is 31.85 kg/m as calculated from the following:   Height as of 11/24/22: 5\' 2"  (1.575 m).   Weight as of this encounter: 79 kg.  Code Status: Full CODE STATUS DVT Prophylaxis:  enoxaparin (LOVENOX) injection 40 mg Start: 12/03/22 2200   Level of Care: Level of care: Telemetry Family Communication: Updated patient Disposition  PFV      Full                                                        +---------+---------------+---------+-----------+----------+--------------+ POP      Full           Yes      Yes                                 +---------+---------------+---------+-----------+----------+--------------+ PTV      Full                                                        +---------+---------------+---------+-----------+----------+--------------+ PERO     Full                                                        +---------+---------------+---------+-----------+----------+--------------+    Summary: RIGHT: - There is no evidence of deep vein thrombosis in the lower extremity.  - No cystic structure found in the popliteal fossa.  LEFT: - There is no evidence of deep vein thrombosis in the lower extremity. However, portions of this examination were limited- see technologist comments above.  - No cystic structure found in the popliteal fossa.  *See table(s) above for measurements and observations.    Preliminary    VAS  US CAROTID  Result Date: 12/03/2022 Carotid Arterial Duplex Study Patient Name:  MAREESA NARA [facility]  Date of Exam:   12/03/2022 Medical Rec #: [id number]           Accession #:    [id number] Date of Birth: [birth year]           Patient Gender: F Patient Age:   77 years Exam Location:  [facility] Procedure:      VAS US CAROTID Referring Phys: Valente David --------------------------------------------------------------------------------  Indications:       Syncope. Risk Factors:      Hypertension, Diabetes, coronary artery disease. Comparison Study:  No significant changes seen since prior exam 10/29/18 Performing Technologist: Shona Simpson  Examination Guidelines: A complete evaluation includes B-mode imaging, spectral Doppler, color Doppler, and power Doppler as needed of all accessible portions of each vessel. Bilateral testing is considered an integral part of a complete examination. Limited examinations for reoccurring indications may be performed as noted.  Right Carotid Findings: +----------+--------+--------+--------+------------------+--------+           PSV cm/sEDV cm/sStenosisPlaque DescriptionComments +----------+--------+--------+--------+------------------+--------+ CCA Prox  136     20                                         +----------+--------+--------+--------+------------------+--------+ CCA Distal88      14                                         +----------+--------+--------+--------+------------------+--------+

## 2022-12-05 DIAGNOSIS — N390 Urinary tract infection, site not specified: Secondary | ICD-10-CM | POA: Diagnosis not present

## 2022-12-05 DIAGNOSIS — J189 Pneumonia, unspecified organism: Secondary | ICD-10-CM

## 2022-12-05 DIAGNOSIS — J984 Other disorders of lung: Secondary | ICD-10-CM | POA: Diagnosis not present

## 2022-12-05 DIAGNOSIS — R55 Syncope and collapse: Secondary | ICD-10-CM | POA: Diagnosis not present

## 2022-12-05 LAB — BASIC METABOLIC PANEL
Anion gap: 10 (ref 5–15)
BUN: 11 mg/dL (ref 8–23)
CO2: 21 mmol/L — ABNORMAL LOW (ref 22–32)
Calcium: 8.6 mg/dL — ABNORMAL LOW (ref 8.9–10.3)
Chloride: 104 mmol/L (ref 98–111)
Creatinine, Ser: 0.62 mg/dL (ref 0.44–1.00)
GFR, Estimated: >60 mL/min (ref >60)
Glucose, Bld: 131 mg/dL — ABNORMAL HIGH (ref 70–99)
Potassium: 3.6 mmol/L (ref 3.5–5.1)
Sodium: 135 mmol/L (ref 135–145)

## 2022-12-05 LAB — CBC
HCT: 33.2 % — ABNORMAL LOW (ref 36.0–46.0)
Hemoglobin: 10.4 g/dL — ABNORMAL LOW (ref 12.0–15.0)
MCH: 27.7 pg (ref 26.0–34.0)
MCHC: 31.3 g/dL (ref 30.0–36.0)
MCV: 88.3 fL (ref 80.0–100.0)
Platelets: 173 10*3/uL (ref 150–400)
RBC: 3.76 MIL/uL — ABNORMAL LOW (ref 3.87–5.11)
RDW: 13.7 % (ref 11.5–15.5)
WBC: 5.4 10*3/uL (ref 4.0–10.5)
nRBC: 0 % (ref 0.0–0.2)

## 2022-12-05 LAB — URINE CULTURE: Culture: NO GROWTH

## 2022-12-05 LAB — SEDIMENTATION RATE: Sed Rate: 15 mm/h (ref 0–22)

## 2022-12-05 LAB — GLUCOSE, CAPILLARY: Glucose-Capillary: 116 mg/dL — ABNORMAL HIGH (ref 70–99)

## 2022-12-05 MED ORDER — HYDROMORPHONE HCL 1 MG/ML IJ SOLN: 0.5000 mg | INTRAMUSCULAR | Status: DC | PRN

## 2022-12-05 MED ORDER — BISACODYL 10 MG RE SUPP: 10.0000 mg | Freq: Every day | RECTAL | Status: DC | PRN

## 2022-12-05 MED ORDER — DIPHENHYDRAMINE HCL 50 MG/ML IJ SOLN
12.5000 mg | Freq: Four times a day (QID) | INTRAMUSCULAR | Status: DC | PRN
  Administered 2022-12-05: 12.5 mg via INTRAVENOUS
  Filled 2022-12-05: qty 1

## 2022-12-05 MED ORDER — FAMOTIDINE 20 MG PO TABS
20.0000 mg | ORAL_TABLET | Freq: Two times a day (BID) | ORAL | Status: AC
  Administered 2022-12-05 – 2022-12-06 (×3): 20 mg via ORAL
  Filled 2022-12-05 (×3): qty 1

## 2022-12-05 MED ORDER — OXYCODONE-ACETAMINOPHEN 5-325 MG PO TABS
1.0000 | ORAL_TABLET | ORAL | Status: DC | PRN
  Administered 2022-12-05: 1 via ORAL
  Filled 2022-12-05: qty 1

## 2022-12-05 NOTE — Evaluation (Signed)
Physical Therapy Evaluation Patient Details Name: Sheila Reynolds MRN: [id number] DOB: Jan 06, 1946 Today's Date: 12/05/2022  History of Present Illness  Sheila Reynolds is a 77 yr old female admitted to the hospital after an acute syncopal event on 12/02/22; she reportedly hit her head afterwards. She was found to have a suspected UTI. PMH: DM II, HTN, v. tach, CAD, sarcoidosis, OSA, glaucoma, fibromyalgis, arthritis, BLE lymphedema  Clinical Impression  Pt admitted with above diagnosis. At baseline, pt ambulates in home without AD and in community with cane.  She does report a hx of falls - most of which have occurred when not using cane.  Today, pt able to ambulate with cane and supervision.  She performed stairs similar to home set up.  She does have some generalized decreased strength.  Pt did not have a loss of balance during session but does report history of falls.  Pt appears to be near baseline but with hx of falls recommend outpt PT to advance. Also, recommended use of cane at all times as falls have occurred without AD Pt currently with functional limitations due to the deficits listed below (see PT Problem List). Pt will benefit from acute skilled PT to increase their independence and safety with mobility to allow discharge.           If plan is discharge home, recommend the following: A little help with walking and/or transfers;A little help with bathing/dressing/bathroom;Assistance with cooking/housework;Help with stairs or ramp for entrance   Can travel by private vehicle        Equipment Recommendations None recommended by PT  Recommendations for Other Services       Functional Status Assessment Patient has had a recent decline in their functional status and demonstrates the ability to make significant improvements in function in a reasonable and predictable amount of time.     Precautions / Restrictions Precautions Precautions: Fall Restrictions Weight Bearing  Restrictions: No      Mobility  Bed Mobility Overal bed mobility: Modified Independent Bed Mobility: Supine to Sit, Sit to Supine     Supine to sit: Modified independent (Device/Increase time), HOB elevated Sit to supine: HOB elevated   General bed mobility comments: increased time    Transfers Overall transfer level: Needs assistance Equipment used: None Transfers: Sit to/from Stand Sit to Stand: Contact guard assist           General transfer comment: CGA for safety; performed x 2 with increased time and use of momentum    Ambulation/Gait Ambulation/Gait assistance: Contact guard assist, Supervision Gait Distance (Feet): 200 Feet Assistive device: Rolling walker (2 wheels), Straight cane Gait Pattern/deviations: Step-through pattern, Decreased stride length Gait velocity: decreased but functional     General Gait Details: Started with cane and CGA  and tried with RW with superivison.  Progressed back to cane as that is pt's preference with supervision.  She did not demonstrate any LOB.  Stairs Stairs: Yes Stairs assistance: Contact guard assist Stair Management: One rail Right, Step to pattern Number of Stairs: 5 General stair comments: Slow speed but no LOB  Wheelchair Mobility     Tilt Bed    Modified Rankin (Stroke Patients Only)       Balance Overall balance assessment: Needs assistance Sitting-balance support: No upper extremity supported Sitting balance-Leahy Scale: Good     Standing balance support: Single extremity supported, No upper extremity supported Standing balance-Leahy Scale: Fair Standing balance comment: Cane to ambulate but could static stand without AD; hx  of falls                             Pertinent Vitals/Pain Pain Assessment Pain Assessment: 0-10 Pain Score: 3  Pain Location: headache Pain Descriptors / Indicators: Aching Pain Intervention(s): Limited activity within patient's tolerance, Monitored during  session    Home Living Family/patient expects to be discharged to:: Private residence Living Arrangements: Spouse/significant other Available Help at Discharge: Family;Available PRN/intermittently Type of Home: House Home Access: Level entry     Alternate Level Stairs-Number of Steps: 3 level home/split level Home Layout: Multi-level Home Equipment: Shower seat - built [employer] (2 wheels) Additional Comments: hurrycane, Life alert    Prior Function Prior Level of Function : Independent/Modified Independent             Mobility Comments: She did not use an AD for household ambulation, however used a hurrycane when outside the home. Could ambulate in community. DOes report 5 falls in 6 months (states typically when not using cane) ADLs Comments: She was modified independent to independent with ADLs, cooking and cleaning. She has hored assist for cleaning 1x per month. She has not driven in a few months.     Extremity/Trunk Assessment   Upper Extremity Assessment Upper Extremity Assessment: Defer to OT evaluation    Lower Extremity Assessment Lower Extremity Assessment: LLE deficits/detail;RLE deficits/detail RLE Deficits / Details: ROM WFL; MMT 5/5 LLE Deficits / Details: ROM WFL; MMT4/5 - lymphedema in LE baseline    Cervical / Trunk Assessment Cervical / Trunk Assessment: Normal  Communication   Communication Communication: No apparent difficulties  Cognition Arousal: Alert Behavior During Therapy: WFL for tasks assessed/performed Overall Cognitive Status: Within Functional Limits for tasks assessed                                          General Comments General comments (skin integrity, edema, etc.): Noted pt recently seen by OT with orthostatic BP negative (Of note: her blood pressure was taken during the session and noted to be as follows: supine 135/75, seated EOB 152/79, standing 142/86; no dizziness or lightheadedness reported during  session. )    Exercises     Assessment/Plan    PT Assessment Patient needs continued PT services  PT Problem List Decreased strength;Decreased activity tolerance;Decreased knowledge of use of DME;Decreased balance;Decreased mobility;Decreased knowledge of precautions       PT Treatment Interventions DME instruction;Therapeutic exercise;Gait training;Balance training;Stair training;Neuromuscular re-education;Functional mobility training;Therapeutic activities;Patient/family education    PT Goals (Current goals can be found in the Care Plan section)  Acute Rehab PT Goals Patient Stated Goal: return home PT Goal Formulation: With patient Time For Goal Achievement: 12/19/22 Potential to Achieve Goals: Good    Frequency Min 1X/week     Co-evaluation               AM-PAC PT "6 Clicks" Mobility  Outcome Measure Help needed turning from your back to your side while in a flat bed without using bedrails?: None Help needed moving from lying on your back to sitting on the side of a flat bed without using bedrails?: None Help needed moving to and from a bed to a chair (including a wheelchair)?: A Little Help needed standing up from a chair using your arms (e.g., wheelchair or bedside chair)?: A Little Help needed to walk  in hospital room?: A Little Help needed climbing 3-5 steps with a railing? : A Little 6 Click Score: 20    End of Session Equipment Utilized During Treatment: Gait belt Activity Tolerance: Patient tolerated treatment well Patient left: in bed;with call bell/phone within reach;with bed alarm set Nurse Communication: Mobility status PT Visit Diagnosis: Other abnormalities of gait and mobility (R26.89);History of falling (Z91.81)    Time: 1734-1800 PT Time Calculation (min) (ACUTE ONLY): 26 min   Charges:   PT Evaluation $PT Eval Low Complexity: 1 Low PT Treatments $Gait Training: 8-22 mins PT General Charges $$ ACUTE PT VISIT: 1 Visit         Anise Salvo,  PT Acute Rehab [facility] [phone]   Rayetta Humphrey 12/05/2022, 6:17 PM

## 2022-12-05 NOTE — Telephone Encounter (Signed)
She doe shave ILD that is table 2018 > 2023 oct but no PFT after thta   Plan  - keep [facility] appt Nov for nodule  -but she needs PFT next 4 weeks and then see me (MR) in ILD clinic sometime Dec 2024 or Jan 2025  - then can go back to Dr Arvil Chaco     Latest Ref Rng & Units 11/27/2021    1:16 PM 04/13/2020   10:06 AM 11/25/2016    4:11 PM  PFT Results  FVC-Pre L 1.67  1.66  1.70   FVC-Predicted Pre % 63  79  78   FVC-Post L 1.60  1.59  1.69   FVC-Predicted Post % 61  76  78   Pre FEV1/FVC % % 77  77  81   Post FEV1/FCV % % 80  81  83   FEV1-Pre L 1.29  1.27  1.39   FEV1-Predicted Pre % 66  79  82   FEV1-Post L 1.28  1.28  1.40   DLCO uncorrected ml/min/mmHg 9.64  9.27  9.95   DLCO UNC% % 53  51  46   DLCO corrected ml/min/mmHg 10.38  9.27  10.18   DLCO COR %Predicted % 57  51  47   DLVA Predicted % 87  80  88   TLC L 3.24  3.52  3.29   TLC % Predicted % 68  73  69   RV % Predicted % 71  85  75

## 2022-12-05 NOTE — Evaluation (Signed)
Occupational Therapy Evaluation Patient Details Name: Sheila Reynolds MRN: [id number] DOB: [birth year] Today's Date: 12/05/2022   History of Present Illness Sheila Reynolds is a 77 yr old female admitted to the hospital after an acute syncopal event; she reportedly hit her head. She was found to have a suspected UTI. PMH: DM II, HTN, v. tach, CAD, sarcoidosis, OSA, glaucoma, fibromyalgis, arthritis, BLE lymphedema   Clinical Impression   The pt performed all assessed tasks with supervision or better, including lower body dressing, sit to stand, ambulating in her room without an assistive device and toileting at bathroom level. She appears to be very near to her baseline level of functioning for ADL management and she is not presenting with functional deficits that warrant the need for further OT services. OT will sign off and recommend she return home with family upon discharge.  Of note: her blood pressure was taken during the session and noted to be as follows: supine 135/75, seated EOB 152/79, standing 142/86; no dizziness or lightheadedness reported during session.       If plan is discharge home, recommend the following: Help with stairs or ramp for entrance;Assist for transportation    Functional Status Assessment  Patient has not had a recent decline in their functional status  Equipment Recommendations  None recommended by OT    Recommendations for Other Services       Precautions / Restrictions Restrictions Weight Bearing Restrictions: No      Mobility Bed Mobility Overal bed mobility: Modified Independent Bed Mobility: Supine to Sit     Supine to sit: Modified independent (Device/Increase time)          Transfers Overall transfer level: Needs assistance Equipment used: None Transfers: Sit to/from Stand Sit to Stand: Supervision                  Balance Overall balance assessment: Mild deficits observed, not formally tested        ADL either  performed or assessed with clinical judgement   ADL Overall ADL's : At baseline          General ADL Comments: The pt performed all assessed tasks with distant supervision or better, including donning her socks seated EOB, toileting at bathroom level, and grooming in standing at the sink.     Vision Baseline Vision/History: 1 Wears glasses Additional Comments: she correctly read the time depicted on the wall clock            Pertinent Vitals/Pain Pain Assessment Pain Assessment: 0-10 Pain Score: 3  Pain Location: headache Pain Intervention(s): Monitored during session     Extremity/Trunk Assessment Upper Extremity Assessment Upper Extremity Assessment: Right hand dominant;Overall [facility] for tasks assessed   Lower Extremity Assessment Lower Extremity Assessment: Overall WFL for tasks assessed       Communication Communication Communication: No apparent difficulties   Cognition Arousal: Alert Behavior During Therapy: WFL for tasks assessed/performed Overall Cognitive Status: Within Functional Limits for tasks assessed        General Comments: oriented x4, able to follow commands without difficulty                Home Living Family/patient expects to be discharged to:: Private residence Living Arrangements: Spouse/significant other   Type of Home: House       Home Layout: Multi-level Alternate Level Stairs-Number of Steps: 3 level home/split level   Bathroom Shower/Tub: Walk-in shower         Home Equipment: Shower seat - built  in;Rolling Walker (2 wheels)   Additional Comments: hurrycane, Life alert      Prior Functioning/Environment Prior Level of Function : Independent/Modified Independent             Mobility Comments: She did not use an AD for household ambulation, however used a hurrycane when outside the home. ADLs Comments: She was modified independent to independent with ADLs, cooking and cleaning. She has hored assist for cleaning 1x  per month. She has not driven in a few months.              OT Treatment/Interventions:   N/A      OT Frequency:  N/A       AM-PAC OT "6 Clicks" Daily Activity     Outcome Measure Help from another person eating meals?: None Help from another person taking care of personal grooming?: None Help from another person toileting, which includes using toliet, bedpan, or urinal?: None Help from another person bathing (including washing, rinsing, drying)?: None Help from another person to put on and taking off regular upper body clothing?: None Help from another person to put on and taking off regular lower body clothing?: None 6 Click Score: 24   End of Session Equipment Utilized During Treatment: Other (comment) (N/A) Nurse Communication: Mobility status  Activity Tolerance: Patient tolerated treatment well Patient left: in bed;with call bell/phone within reach  OT Visit Diagnosis: History of falling (Z91.81)                Time: 1610-9604 OT Time Calculation (min): 32 min Charges:  OT General Charges $OT Visit: 1 Visit OT Evaluation $OT Eval Low Complexity: 1 Low    Takina Busser L Cantrell Larouche, OTR/L 12/05/2022, 5:04 PM

## 2022-12-05 NOTE — Plan of Care (Signed)
  Problem: Education: Goal: Knowledge of General Education information will improve Description: Including pain rating scale, medication(s)/side effects and non-pharmacologic comfort measures Outcome: Progressing   Problem: Health Behavior/Discharge Planning: Goal: Ability to manage health-related needs will improve Outcome: Progressing   Problem: Clinical Measurements: Goal: Ability to maintain clinical measurements within normal limits will improve Outcome: Progressing Goal: Will remain free from infection Outcome: Progressing Goal: Diagnostic test results will improve Outcome: Progressing Goal: Respiratory complications will improve Outcome: Progressing Goal: Cardiovascular complication will be avoided Outcome: Progressing   Problem: Activity: Goal: Risk for activity intolerance will decrease Outcome: Progressing   Problem: Nutrition: Goal: Adequate nutrition will be maintained Outcome: Progressing   Problem: Coping: Goal: Level of anxiety will decrease Outcome: Progressing   Problem: Elimination: Goal: Will not experience complications related to bowel motility Outcome: Progressing Goal: Will not experience complications related to urinary retention Outcome: Progressing   Problem: Pain Management: Goal: General experience of comfort will improve Outcome: Progressing   Problem: Safety: Goal: Ability to remain free from injury will improve Outcome: Progressing   Problem: Skin Integrity: Goal: Risk for impaired skin integrity will decrease Outcome: Progressing   Problem: Education: Goal: Knowledge of condition and prescribed therapy will improve Outcome: Progressing   Problem: Cardiac: Goal: Will achieve and/or maintain adequate cardiac output Outcome: Progressing   Problem: Physical Regulation: Goal: Complications related to the disease process, condition or treatment will be avoided or minimized Outcome: Progressing

## 2022-12-05 NOTE — Progress Notes (Signed)
Ext: no pedal edema bilaterally Neuro: no new deficits Psych: Normal affect     Data Reviewed:  I have personally reviewed following labs    CBC Lab Results  Component Value Date   WBC 5.4 12/05/2022   RBC 3.76 (L) 12/05/2022   HGB 10.4 (L) 12/05/2022   HCT 33.2 (L) 12/05/2022   MCV 88.3 12/05/2022   MCH 27.7 12/05/2022   PLT 173 12/05/2022   MCHC 31.3 12/05/2022   RDW 13.7 12/05/2022   LYMPHSABS 1.3 11/07/2022   MONOABS 0.7 11/07/2022   EOSABS 0.3 11/07/2022   BASOSABS 0.0 11/07/2022     Last metabolic panel Lab Results  Component Value Date   NA 135 12/05/2022   K 3.6 12/05/2022   CL 104 12/05/2022   CO2 21 (L) 12/05/2022   BUN 11 12/05/2022   CREATININE 0.62 12/05/2022   GLUCOSE 131 (H) 12/05/2022   GFRNONAA >60 12/05/2022   GFRAA 91 03/28/2020   CALCIUM 8.6 (L) 12/05/2022   PROT 6.1 (L) 12/04/2022   ALBUMIN 3.4 (L) 12/04/2022   LABGLOB 2.2 09/22/2022   AGRATIO 1.7 06/13/2021   BILITOT 0.3 12/04/2022   ALKPHOS 52 12/04/2022   AST 15 12/04/2022   ALT 10 12/04/2022   ANIONGAP 10 12/05/2022    CBG (last 3)  Recent Labs    12/04/22 0609 12/04/22 1925 12/05/22 0519  GLUCAP 111* 99 116*      Coagulation Profile: No results for input(s): "INR", "PROTIME" in the last 168 hours.   Radiology Studies: I have personally reviewed the imaging studies  VAS [place] LOWER EXTREMITY VENOUS (DVT)  Result Date: 12/04/2022  Lower Venous DVT Study Patient Name:  LAYLANNI PEDLEY [facility]  Date of Exam:   12/04/2022 Medical Rec #: [id number]           Accession #:    [id number] Date of Birth: 10-10-1945           Patient Gender: F Patient Age:   77 years Exam Location:  [facility] Procedure:      VAS [place] LOWER EXTREMITY  VENOUS (DVT) Referring Phys: Masashi Snowdon --------------------------------------------------------------------------------  Indications: Elevated Ddimer.  Risk Factors: None identified. Limitations: Poor ultrasound/tissue interface. Comparison Study: No prior studies. Performing Technologist: Chanda Busing RVT  Examination Guidelines: A complete evaluation includes B-mode imaging, spectral Doppler, color Doppler, and power Doppler as needed of all accessible portions of each vessel. Bilateral testing is considered an integral part of a complete examination. Limited examinations for reoccurring indications may be performed as noted. The reflux portion of the exam is performed with the patient in reverse Trendelenburg.  +---------+---------------+---------+-----------+----------+--------------+ RIGHT    CompressibilityPhasicitySpontaneityPropertiesThrombus Aging +---------+---------------+---------+-----------+----------+--------------+ CFV      Full           Yes      Yes                                 +---------+---------------+---------+-----------+----------+--------------+ SFJ      Full                                                        +---------+---------------+---------+-----------+----------+--------------+ FV Prox  Full                                                        +---------+---------------+---------+-----------+----------+--------------+  Accession #:    [id number] Date of Birth: 02/03/1946           Patient Gender: F Patient Age:   77 years Exam Location:  [facility] Procedure:      VAS US CAROTID Referring Phys: Valente David --------------------------------------------------------------------------------  Indications:       Syncope. Risk Factors:      Hypertension, Diabetes, coronary artery disease. Comparison Study:  No significant changes seen since prior exam 10/29/18 Performing Technologist: Shona Simpson  Examination Guidelines: A complete evaluation includes B-mode imaging, spectral Doppler, color Doppler, and power Doppler as needed of all accessible portions of each vessel. Bilateral testing is considered an integral part of a complete examination. Limited examinations for reoccurring indications may be performed as noted.  Right Carotid Findings: +----------+--------+--------+--------+------------------+--------+           PSV cm/sEDV cm/sStenosisPlaque DescriptionComments +----------+--------+--------+--------+------------------+--------+ CCA Prox  136     20                                         +----------+--------+--------+--------+------------------+--------+ CCA Distal88      14                                         +----------+--------+--------+--------+------------------+--------+ ICA Prox  67      13      1-39%   heterogenous                +----------+--------+--------+--------+------------------+--------+ ICA Mid   84      22                                         +----------+--------+--------+--------+------------------+--------+ ICA Distal111     36                                         +----------+--------+--------+--------+------------------+--------+ ECA       79      9                                          +----------+--------+--------+--------+------------------+--------+ +----------+--------+-------+--------+-------------------+           PSV cm/sEDV cmsDescribeArm Pressure (mmHG) +----------+--------+-------+--------+-------------------+ [id number]      0                                  +----------+--------+-------+--------+-------------------+ +---------+--------+--+--------+--+ VertebralPSV cm/s59EDV cm/s16 +---------+--------+--+--------+--+  Left Carotid Findings: +----------+--------+--------+--------+------------------+--------+           PSV cm/sEDV cm/sStenosisPlaque DescriptionComments +----------+--------+--------+--------+------------------+--------+ CCA Prox  124     26                                         +----------+--------+--------+--------+------------------+--------+ CCA Distal88      23                                         +----------+--------+--------+--------+------------------+--------+  FV Distal               Yes      Yes                                 +---------+---------------+---------+-----------+----------+--------------+ PFV      Full                                                        +---------+---------------+---------+-----------+----------+--------------+ POP      Full           Yes      Yes                                 +---------+---------------+---------+-----------+----------+--------------+ PTV      Full                                                        +---------+---------------+---------+-----------+----------+--------------+ PERO     Full                                                         +---------+---------------+---------+-----------+----------+--------------+     Summary: RIGHT: - There is no evidence of deep vein thrombosis in the lower extremity.  - No cystic structure found in the popliteal fossa.  LEFT: - There is no evidence of deep vein thrombosis in the lower extremity. However, portions of this examination were limited- see technologist comments above.  - No cystic structure found in the popliteal fossa.  *See table(s) above for measurements and observations. Electronically signed by Heath Lark on 12/04/2022 at 5:45:06 PM.    Final    ECHOCARDIOGRAM COMPLETE  Result Date: 12/04/2022    ECHOCARDIOGRAM REPORT   Patient Name:   Sheila Reynolds Date of Exam: 12/04/2022 Medical Rec #:  [id number]          Height:       62.0 in Accession #:    [id number]         Weight:       174.2 lb Date of Birth:  1945/06/14          BSA:          1.803 m Patient Age:    77 years           BP:           119/61 mmHg Patient Gender: F                  HR:           65 bpm. Exam Location:  Inpatient Procedure: 2D Echo, Cardiac Doppler and Color Doppler Indications:    Syncope R55  History:        Patient has prior history of Echocardiogram examinations, most  [facility]                                                                              Yvett Muzzey, is a 77 y.o. female, DOB - 1945-05-28, [id number] Admit date - 12/02/2022    Outpatient Primary MD for the patient is Dorothyann Peng, MD  LOS - 1  days  No chief complaint on file.      Brief summary   Patient is a 77 year old female with diabetes mellitus type 2, HTN, HLP, NSVT, CAD, GERD, sarcoidosis, OSA on CPAP presented with syncope and head trauma.  Patient was eating dinner when she got lightheaded and then suddenly passed out.  She could not remember if she was seated or standing at the time as she occasionally eats while standing.  She also believes she struck the back of her head with the chair as it was sore.  Unclear how long she was unconscious.  No tongue biting or urinary incontinence.  She admitted to having a headache but was able to ambulate after this event.  She has chronic right hip pain that is slightly worse and mid back pain.  She admitted to increased urinary frequency and urgency without dysuria or oliguria or hematuria.  No flank pain.  No chest pain or palpitations.  In ED, vital signs were stable, UA positive for UTI.  Hemoglobin 11.6, D-dimer elevated at 4.25. CTA chest showed no PE however showed spiculated and cavitary nodule in the right upper lung that appears to be slowly growing since the prior studies.   Assessment & Plan    Principal Problem:   Syncope -Unclear cause, could be vasovagal, orthostatic or arrhythmia -2D echo showed EF of 60 to 65%, no regional wall motion abnormalities, RV SF normal -Carotid Dopplers showed 1 to 39% stenosis right and left ICA -No arrhythmias  Active Problems: Headache -CT head negative for any bleeding -CT C-spine, CT T-spine negative for any fracture dislocation -Continues to complain of headache, no nausea vomiting or blurred vision.  No FND's.  Continue pain control with oxycodone  and IV Dilaudid as needed     Acute lower UTI -on IV Rocephin, urine culture showed no growth -For now continue IV Rocephin as receiving for cavitating mass RUL    Cavitating mass in right upper lung lobe -CT chest showed no PE however spiculated, cavitary nodule in the right upper lung, slowly growing since the prior studies.  Patient has underlying history of sarcoidosis although reported that it has been in remission for many years -Outpatient follow-up and consideration for navigational bronchoscopy and PET scan -Pulmonology following, seen by Dr. Marchelle Gearing on 10/24, recommended 1 week of antibiotics, needs PET scan outpatient -Follow ANA, ACE. ESR 15    Dyslipidemia,  Coronary artery disease -Continue aspirin, Plavix, Imdur, beta-blocker, statin -Patient follows cardiology, Dr. Elease Hashimoto outpatient    Chronic obstructive pulmonary disease (COPD) (HCC) -Currently no acute wheezing, continue inhalers  Chronic lymphedema bilateral LE -Venous Dopplers negative for any DVT -Recommended continue elevating lower extremities, compression stockings, diuretics likely not going to help  Diabetes mellitus type 2, NIDDM -Continue linagliptin, metformin  Mild upper lip  [facility]                                                                              Yvett Muzzey, is a 77 y.o. female, DOB - 1945-05-28, [id number] Admit date - 12/02/2022    Outpatient Primary MD for the patient is Dorothyann Peng, MD  LOS - 1  days  No chief complaint on file.      Brief summary   Patient is a 77 year old female with diabetes mellitus type 2, HTN, HLP, NSVT, CAD, GERD, sarcoidosis, OSA on CPAP presented with syncope and head trauma.  Patient was eating dinner when she got lightheaded and then suddenly passed out.  She could not remember if she was seated or standing at the time as she occasionally eats while standing.  She also believes she struck the back of her head with the chair as it was sore.  Unclear how long she was unconscious.  No tongue biting or urinary incontinence.  She admitted to having a headache but was able to ambulate after this event.  She has chronic right hip pain that is slightly worse and mid back pain.  She admitted to increased urinary frequency and urgency without dysuria or oliguria or hematuria.  No flank pain.  No chest pain or palpitations.  In ED, vital signs were stable, UA positive for UTI.  Hemoglobin 11.6, D-dimer elevated at 4.25. CTA chest showed no PE however showed spiculated and cavitary nodule in the right upper lung that appears to be slowly growing since the prior studies.   Assessment & Plan    Principal Problem:   Syncope -Unclear cause, could be vasovagal, orthostatic or arrhythmia -2D echo showed EF of 60 to 65%, no regional wall motion abnormalities, RV SF normal -Carotid Dopplers showed 1 to 39% stenosis right and left ICA -No arrhythmias  Active Problems: Headache -CT head negative for any bleeding -CT C-spine, CT T-spine negative for any fracture dislocation -Continues to complain of headache, no nausea vomiting or blurred vision.  No FND's.  Continue pain control with oxycodone  and IV Dilaudid as needed     Acute lower UTI -on IV Rocephin, urine culture showed no growth -For now continue IV Rocephin as receiving for cavitating mass RUL    Cavitating mass in right upper lung lobe -CT chest showed no PE however spiculated, cavitary nodule in the right upper lung, slowly growing since the prior studies.  Patient has underlying history of sarcoidosis although reported that it has been in remission for many years -Outpatient follow-up and consideration for navigational bronchoscopy and PET scan -Pulmonology following, seen by Dr. Marchelle Gearing on 10/24, recommended 1 week of antibiotics, needs PET scan outpatient -Follow ANA, ACE. ESR 15    Dyslipidemia,  Coronary artery disease -Continue aspirin, Plavix, Imdur, beta-blocker, statin -Patient follows cardiology, Dr. Elease Hashimoto outpatient    Chronic obstructive pulmonary disease (COPD) (HCC) -Currently no acute wheezing, continue inhalers  Chronic lymphedema bilateral LE -Venous Dopplers negative for any DVT -Recommended continue elevating lower extremities, compression stockings, diuretics likely not going to help  Diabetes mellitus type 2, NIDDM -Continue linagliptin, metformin  Mild upper lip  [facility]                                                                              Yvett Muzzey, is a 77 y.o. female, DOB - 1945-05-28, [id number] Admit date - 12/02/2022    Outpatient Primary MD for the patient is Dorothyann Peng, MD  LOS - 1  days  No chief complaint on file.      Brief summary   Patient is a 77 year old female with diabetes mellitus type 2, HTN, HLP, NSVT, CAD, GERD, sarcoidosis, OSA on CPAP presented with syncope and head trauma.  Patient was eating dinner when she got lightheaded and then suddenly passed out.  She could not remember if she was seated or standing at the time as she occasionally eats while standing.  She also believes she struck the back of her head with the chair as it was sore.  Unclear how long she was unconscious.  No tongue biting or urinary incontinence.  She admitted to having a headache but was able to ambulate after this event.  She has chronic right hip pain that is slightly worse and mid back pain.  She admitted to increased urinary frequency and urgency without dysuria or oliguria or hematuria.  No flank pain.  No chest pain or palpitations.  In ED, vital signs were stable, UA positive for UTI.  Hemoglobin 11.6, D-dimer elevated at 4.25. CTA chest showed no PE however showed spiculated and cavitary nodule in the right upper lung that appears to be slowly growing since the prior studies.   Assessment & Plan    Principal Problem:   Syncope -Unclear cause, could be vasovagal, orthostatic or arrhythmia -2D echo showed EF of 60 to 65%, no regional wall motion abnormalities, RV SF normal -Carotid Dopplers showed 1 to 39% stenosis right and left ICA -No arrhythmias  Active Problems: Headache -CT head negative for any bleeding -CT C-spine, CT T-spine negative for any fracture dislocation -Continues to complain of headache, no nausea vomiting or blurred vision.  No FND's.  Continue pain control with oxycodone  and IV Dilaudid as needed     Acute lower UTI -on IV Rocephin, urine culture showed no growth -For now continue IV Rocephin as receiving for cavitating mass RUL    Cavitating mass in right upper lung lobe -CT chest showed no PE however spiculated, cavitary nodule in the right upper lung, slowly growing since the prior studies.  Patient has underlying history of sarcoidosis although reported that it has been in remission for many years -Outpatient follow-up and consideration for navigational bronchoscopy and PET scan -Pulmonology following, seen by Dr. Marchelle Gearing on 10/24, recommended 1 week of antibiotics, needs PET scan outpatient -Follow ANA, ACE. ESR 15    Dyslipidemia,  Coronary artery disease -Continue aspirin, Plavix, Imdur, beta-blocker, statin -Patient follows cardiology, Dr. Elease Hashimoto outpatient    Chronic obstructive pulmonary disease (COPD) (HCC) -Currently no acute wheezing, continue inhalers  Chronic lymphedema bilateral LE -Venous Dopplers negative for any DVT -Recommended continue elevating lower extremities, compression stockings, diuretics likely not going to help  Diabetes mellitus type 2, NIDDM -Continue linagliptin, metformin  Mild upper lip  FV Distal               Yes      Yes                                 +---------+---------------+---------+-----------+----------+--------------+ PFV      Full                                                        +---------+---------------+---------+-----------+----------+--------------+ POP      Full           Yes      Yes                                 +---------+---------------+---------+-----------+----------+--------------+ PTV      Full                                                        +---------+---------------+---------+-----------+----------+--------------+ PERO     Full                                                         +---------+---------------+---------+-----------+----------+--------------+     Summary: RIGHT: - There is no evidence of deep vein thrombosis in the lower extremity.  - No cystic structure found in the popliteal fossa.  LEFT: - There is no evidence of deep vein thrombosis in the lower extremity. However, portions of this examination were limited- see technologist comments above.  - No cystic structure found in the popliteal fossa.  *See table(s) above for measurements and observations. Electronically signed by Heath Lark on 12/04/2022 at 5:45:06 PM.    Final    ECHOCARDIOGRAM COMPLETE  Result Date: 12/04/2022    ECHOCARDIOGRAM REPORT   Patient Name:   Sheila Reynolds Date of Exam: 12/04/2022 Medical Rec #:  [id number]          Height:       62.0 in Accession #:    [id number]         Weight:       174.2 lb Date of Birth:  1945/06/14          BSA:          1.803 m Patient Age:    77 years           BP:           119/61 mmHg Patient Gender: F                  HR:           65 bpm. Exam Location:  Inpatient Procedure: 2D Echo, Cardiac Doppler and Color Doppler Indications:    Syncope R55  History:        Patient has prior history of Echocardiogram examinations, most  [facility]                                                                              Yvett Muzzey, is a 77 y.o. female, DOB - 1945-05-28, [id number] Admit date - 12/02/2022    Outpatient Primary MD for the patient is Dorothyann Peng, MD  LOS - 1  days  No chief complaint on file.      Brief summary   Patient is a 77 year old female with diabetes mellitus type 2, HTN, HLP, NSVT, CAD, GERD, sarcoidosis, OSA on CPAP presented with syncope and head trauma.  Patient was eating dinner when she got lightheaded and then suddenly passed out.  She could not remember if she was seated or standing at the time as she occasionally eats while standing.  She also believes she struck the back of her head with the chair as it was sore.  Unclear how long she was unconscious.  No tongue biting or urinary incontinence.  She admitted to having a headache but was able to ambulate after this event.  She has chronic right hip pain that is slightly worse and mid back pain.  She admitted to increased urinary frequency and urgency without dysuria or oliguria or hematuria.  No flank pain.  No chest pain or palpitations.  In ED, vital signs were stable, UA positive for UTI.  Hemoglobin 11.6, D-dimer elevated at 4.25. CTA chest showed no PE however showed spiculated and cavitary nodule in the right upper lung that appears to be slowly growing since the prior studies.   Assessment & Plan    Principal Problem:   Syncope -Unclear cause, could be vasovagal, orthostatic or arrhythmia -2D echo showed EF of 60 to 65%, no regional wall motion abnormalities, RV SF normal -Carotid Dopplers showed 1 to 39% stenosis right and left ICA -No arrhythmias  Active Problems: Headache -CT head negative for any bleeding -CT C-spine, CT T-spine negative for any fracture dislocation -Continues to complain of headache, no nausea vomiting or blurred vision.  No FND's.  Continue pain control with oxycodone  and IV Dilaudid as needed     Acute lower UTI -on IV Rocephin, urine culture showed no growth -For now continue IV Rocephin as receiving for cavitating mass RUL    Cavitating mass in right upper lung lobe -CT chest showed no PE however spiculated, cavitary nodule in the right upper lung, slowly growing since the prior studies.  Patient has underlying history of sarcoidosis although reported that it has been in remission for many years -Outpatient follow-up and consideration for navigational bronchoscopy and PET scan -Pulmonology following, seen by Dr. Marchelle Gearing on 10/24, recommended 1 week of antibiotics, needs PET scan outpatient -Follow ANA, ACE. ESR 15    Dyslipidemia,  Coronary artery disease -Continue aspirin, Plavix, Imdur, beta-blocker, statin -Patient follows cardiology, Dr. Elease Hashimoto outpatient    Chronic obstructive pulmonary disease (COPD) (HCC) -Currently no acute wheezing, continue inhalers  Chronic lymphedema bilateral LE -Venous Dopplers negative for any DVT -Recommended continue elevating lower extremities, compression stockings, diuretics likely not going to help  Diabetes mellitus type 2, NIDDM -Continue linagliptin, metformin  Mild upper lip

## 2022-12-06 DIAGNOSIS — J189 Pneumonia, unspecified organism: Secondary | ICD-10-CM | POA: Diagnosis not present

## 2022-12-06 DIAGNOSIS — R55 Syncope and collapse: Secondary | ICD-10-CM | POA: Diagnosis not present

## 2022-12-06 DIAGNOSIS — J984 Other disorders of lung: Secondary | ICD-10-CM | POA: Diagnosis not present

## 2022-12-06 DIAGNOSIS — N390 Urinary tract infection, site not specified: Secondary | ICD-10-CM | POA: Diagnosis not present

## 2022-12-06 LAB — GLUCOSE, CAPILLARY: Glucose-Capillary: 95 mg/dL (ref 70–99)

## 2022-12-06 LAB — ANGIOTENSIN CONVERTING ENZYME: Angiotensin-Converting Enzyme: 52 U/L (ref 14–82)

## 2022-12-06 MED ORDER — AMOXICILLIN-POT CLAVULANATE 875-125 MG PO TABS
1.0000 | ORAL_TABLET | Freq: Two times a day (BID) | ORAL | 0 refills | Status: AC
Start: 2022-12-06 — End: 2022-12-13

## 2022-12-06 MED ORDER — ONDANSETRON 4 MG PO TBDP: 4.0000 mg | ORAL_TABLET | Freq: Three times a day (TID) | ORAL | 0 refills | Status: DC | PRN

## 2022-12-06 MED ORDER — OXYCODONE-ACETAMINOPHEN 5-325 MG PO TABS: 1.0000 | ORAL_TABLET | Freq: Four times a day (QID) | ORAL | 0 refills | Status: AC | PRN

## 2022-12-06 NOTE — Discharge Summary (Signed)
Physician Discharge Summary   Patient: Sheila Reynolds MRN: [id number] DOB: 1945/05/19  Admit date:     12/02/2022  Discharge date: 12/06/22  Discharge Physician: Thad Ranger, MD    PCP: Dorothyann Peng, MD   Recommendations at discharge:    Augmentin 875-125mg  PO BID   Discharge Diagnoses:    Syncope   Cavitating mass in right upper lung lobe     Dyslipidemia   Coronary artery disease   Chronic obstructive pulmonary disease (COPD) (HCC)   Cavitary pneumonia   Headache    Hospital Course: Patient is a 77 year old female with diabetes mellitus type 2, HTN, HLP, NSVT, CAD, GERD, sarcoidosis, OSA on CPAP presented with syncope and head trauma.  Patient was eating dinner when she got lightheaded and then suddenly passed out.  She could not remember if she was seated or standing at the time as she occasionally eats while standing.  She also believes she struck the back of her head with the chair as it was sore.  Unclear how long she was unconscious.  No tongue biting or urinary incontinence.  She admitted to having a headache but was able to ambulate after this event.  She has chronic right hip pain that is slightly worse and mid back pain.  She admitted to increased urinary frequency and urgency without dysuria or oliguria or hematuria.  No flank pain.  No chest pain or palpitations.  In ED, vital signs were stable, UA positive for UTI.  Hemoglobin 11.6, D-dimer elevated at 4.25. CTA chest showed no PE however showed spiculated and cavitary nodule in the right upper lung that appears to be slowly growing since the prior studies.   Assessment and Plan:   Syncope -Unclear cause, could be vasovagal, orthostatic or arrhythmia -2D echo showed EF of 60 to 65%, no regional wall motion abnormalities, RV SF normal -Carotid Dopplers showed 1 to 39% stenosis right and left ICA -No arrhythmias on telemetry.  Patient reported she has had 2 syncopal episodes in the past and has had Holter  monitor as well.  Recommended to follow-up with cardiology outpatient (Dr. Elease Hashimoto for Dr. Duke Salvia) -Recommended compression stockings   Headache -CT head negative for any bleeding -CT C-spine, CT T-spine negative for any fracture dislocation -Pain improving, continue Percocet as needed, no nausea vomiting, blurry vision or FND's.    Presumed acute lower UTI -Patient received IV Rocephin.  Urine culture showed no growth     Cavitating mass in right upper lung lobe -CT chest showed no PE however spiculated, cavitary nodule in the right upper lung, slowly growing since the prior studies.  Patient has underlying history of sarcoidosis although reported that it has been in remission for many years -Outpatient follow-up and consideration for navigational bronchoscopy and PET scan -Pulmonology was consulted, seen by Dr. Marchelle Gearing on 10/24, recommended 1 week of antibiotics, needs PET scan outpatient -Patient was placed on IV Rocephin and Flagyl while inpatient, transition to oral Augmentin for 1 week.  She has outpatient follow-up scheduled with Dr. Delton Coombes on 12/12/2022     Dyslipidemia,  Coronary artery disease -Continue aspirin, Plavix, Imdur, beta-blocker, statin -Patient follows cardiology, Dr. Elease Hashimoto outpatient     Chronic obstructive pulmonary disease (COPD) (HCC) -Currently no acute wheezing, continue inhalers   Chronic lymphedema bilateral LE -Venous Dopplers negative for any DVT -Recommended continue elevating lower extremities, compression stockings, diuretics likely not going to help   Diabetes mellitus type 2, NIDDM -Continue linagliptin, metformin    Obesity Estimated body  Physician Discharge Summary   Patient: Sheila Reynolds MRN: [id number] DOB: 1945/05/19  Admit date:     12/02/2022  Discharge date: 12/06/22  Discharge Physician: Thad Ranger, MD    PCP: Dorothyann Peng, MD   Recommendations at discharge:    Augmentin 875-125mg  PO BID   Discharge Diagnoses:    Syncope   Cavitating mass in right upper lung lobe     Dyslipidemia   Coronary artery disease   Chronic obstructive pulmonary disease (COPD) (HCC)   Cavitary pneumonia   Headache    Hospital Course: Patient is a 77 year old female with diabetes mellitus type 2, HTN, HLP, NSVT, CAD, GERD, sarcoidosis, OSA on CPAP presented with syncope and head trauma.  Patient was eating dinner when she got lightheaded and then suddenly passed out.  She could not remember if she was seated or standing at the time as she occasionally eats while standing.  She also believes she struck the back of her head with the chair as it was sore.  Unclear how long she was unconscious.  No tongue biting or urinary incontinence.  She admitted to having a headache but was able to ambulate after this event.  She has chronic right hip pain that is slightly worse and mid back pain.  She admitted to increased urinary frequency and urgency without dysuria or oliguria or hematuria.  No flank pain.  No chest pain or palpitations.  In ED, vital signs were stable, UA positive for UTI.  Hemoglobin 11.6, D-dimer elevated at 4.25. CTA chest showed no PE however showed spiculated and cavitary nodule in the right upper lung that appears to be slowly growing since the prior studies.   Assessment and Plan:   Syncope -Unclear cause, could be vasovagal, orthostatic or arrhythmia -2D echo showed EF of 60 to 65%, no regional wall motion abnormalities, RV SF normal -Carotid Dopplers showed 1 to 39% stenosis right and left ICA -No arrhythmias on telemetry.  Patient reported she has had 2 syncopal episodes in the past and has had Holter  monitor as well.  Recommended to follow-up with cardiology outpatient (Dr. Elease Hashimoto for Dr. Duke Salvia) -Recommended compression stockings   Headache -CT head negative for any bleeding -CT C-spine, CT T-spine negative for any fracture dislocation -Pain improving, continue Percocet as needed, no nausea vomiting, blurry vision or FND's.    Presumed acute lower UTI -Patient received IV Rocephin.  Urine culture showed no growth     Cavitating mass in right upper lung lobe -CT chest showed no PE however spiculated, cavitary nodule in the right upper lung, slowly growing since the prior studies.  Patient has underlying history of sarcoidosis although reported that it has been in remission for many years -Outpatient follow-up and consideration for navigational bronchoscopy and PET scan -Pulmonology was consulted, seen by Dr. Marchelle Gearing on 10/24, recommended 1 week of antibiotics, needs PET scan outpatient -Patient was placed on IV Rocephin and Flagyl while inpatient, transition to oral Augmentin for 1 week.  She has outpatient follow-up scheduled with Dr. Delton Coombes on 12/12/2022     Dyslipidemia,  Coronary artery disease -Continue aspirin, Plavix, Imdur, beta-blocker, statin -Patient follows cardiology, Dr. Elease Hashimoto outpatient     Chronic obstructive pulmonary disease (COPD) (HCC) -Currently no acute wheezing, continue inhalers   Chronic lymphedema bilateral LE -Venous Dopplers negative for any DVT -Recommended continue elevating lower extremities, compression stockings, diuretics likely not going to help   Diabetes mellitus type 2, NIDDM -Continue linagliptin, metformin    Obesity Estimated body  Headache improving.  No blurred vision, FND's.  Looking forward to discharge today  BP (!) 152/81 (BP Location: Right Arm)   Pulse 75   Temp 98.2 F (36.8 C) (Oral)   Resp 20   Ht 5\' 2"  (1.575 m)   Wt 76.7 kg   SpO2 100%   BMI 30.93 kg/m   Physical Exam General: Alert and oriented x 3, NAD Cardiovascular: S1 S2 clear, RRR.  Respiratory: CTAB, no wheezing, rales or rhonchi Gastrointestinal: Soft, nontender, nondistended, NBS Ext: no pedal edema bilaterally Neuro: no new deficits Psych: Normal affect    Condition at discharge: fair  The results of significant diagnostics from this hospitalization (including imaging, microbiology, ancillary and laboratory) are listed below for reference.   Imaging Studies: VAS [place] LOWER EXTREMITY VENOUS (DVT)  Result Date: 12/04/2022  Lower Venous DVT Study Patient Name:  Sheila Reynolds [facility]  Date of Exam:   12/04/2022 Medical Rec #: [id number]           Accession #:    [id number] Date of Birth: 20-Oct-1945           Patient Gender: F Patient Age:   89 years Exam Location:  [facility] Procedure:      VAS [place] LOWER EXTREMITY VENOUS (DVT) Referring Phys: Emmitte Surgeon --------------------------------------------------------------------------------  Indications: Elevated Ddimer.  Risk Factors: None identified. Limitations: Poor ultrasound/tissue interface. Comparison Study: No prior studies. Performing Technologist: Chanda Busing RVT  Examination Guidelines: A complete evaluation includes B-mode imaging, spectral Doppler, color Doppler, and power  Doppler as needed of all accessible portions of each vessel. Bilateral testing is considered an integral part of a complete examination. Limited examinations for reoccurring indications may be performed as noted. The reflux portion of the exam is performed with the patient in reverse Trendelenburg.  +---------+---------------+---------+-----------+----------+--------------+ RIGHT    CompressibilityPhasicitySpontaneityPropertiesThrombus Aging +---------+---------------+---------+-----------+----------+--------------+ CFV      Full           Yes      Yes                                 +---------+---------------+---------+-----------+----------+--------------+ SFJ      Full                                                        +---------+---------------+---------+-----------+----------+--------------+ FV Prox  Full                                                        +---------+---------------+---------+-----------+----------+--------------+ FV Mid   Full                                                        +---------+---------------+---------+-----------+----------+--------------+ FV DistalFull                                                        +---------+---------------+---------+-----------+----------+--------------+  Headache improving.  No blurred vision, FND's.  Looking forward to discharge today  BP (!) 152/81 (BP Location: Right Arm)   Pulse 75   Temp 98.2 F (36.8 C) (Oral)   Resp 20   Ht 5\' 2"  (1.575 m)   Wt 76.7 kg   SpO2 100%   BMI 30.93 kg/m   Physical Exam General: Alert and oriented x 3, NAD Cardiovascular: S1 S2 clear, RRR.  Respiratory: CTAB, no wheezing, rales or rhonchi Gastrointestinal: Soft, nontender, nondistended, NBS Ext: no pedal edema bilaterally Neuro: no new deficits Psych: Normal affect    Condition at discharge: fair  The results of significant diagnostics from this hospitalization (including imaging, microbiology, ancillary and laboratory) are listed below for reference.   Imaging Studies: VAS [place] LOWER EXTREMITY VENOUS (DVT)  Result Date: 12/04/2022  Lower Venous DVT Study Patient Name:  Sheila Reynolds [facility]  Date of Exam:   12/04/2022 Medical Rec #: [id number]           Accession #:    [id number] Date of Birth: 20-Oct-1945           Patient Gender: F Patient Age:   89 years Exam Location:  [facility] Procedure:      VAS [place] LOWER EXTREMITY VENOUS (DVT) Referring Phys: Emmitte Surgeon --------------------------------------------------------------------------------  Indications: Elevated Ddimer.  Risk Factors: None identified. Limitations: Poor ultrasound/tissue interface. Comparison Study: No prior studies. Performing Technologist: Chanda Busing RVT  Examination Guidelines: A complete evaluation includes B-mode imaging, spectral Doppler, color Doppler, and power  Doppler as needed of all accessible portions of each vessel. Bilateral testing is considered an integral part of a complete examination. Limited examinations for reoccurring indications may be performed as noted. The reflux portion of the exam is performed with the patient in reverse Trendelenburg.  +---------+---------------+---------+-----------+----------+--------------+ RIGHT    CompressibilityPhasicitySpontaneityPropertiesThrombus Aging +---------+---------------+---------+-----------+----------+--------------+ CFV      Full           Yes      Yes                                 +---------+---------------+---------+-----------+----------+--------------+ SFJ      Full                                                        +---------+---------------+---------+-----------+----------+--------------+ FV Prox  Full                                                        +---------+---------------+---------+-----------+----------+--------------+ FV Mid   Full                                                        +---------+---------------+---------+-----------+----------+--------------+ FV DistalFull                                                        +---------+---------------+---------+-----------+----------+--------------+  Physician Discharge Summary   Patient: Sheila Reynolds MRN: [id number] DOB: 1945/05/19  Admit date:     12/02/2022  Discharge date: 12/06/22  Discharge Physician: Thad Ranger, MD    PCP: Dorothyann Peng, MD   Recommendations at discharge:    Augmentin 875-125mg  PO BID   Discharge Diagnoses:    Syncope   Cavitating mass in right upper lung lobe     Dyslipidemia   Coronary artery disease   Chronic obstructive pulmonary disease (COPD) (HCC)   Cavitary pneumonia   Headache    Hospital Course: Patient is a 77 year old female with diabetes mellitus type 2, HTN, HLP, NSVT, CAD, GERD, sarcoidosis, OSA on CPAP presented with syncope and head trauma.  Patient was eating dinner when she got lightheaded and then suddenly passed out.  She could not remember if she was seated or standing at the time as she occasionally eats while standing.  She also believes she struck the back of her head with the chair as it was sore.  Unclear how long she was unconscious.  No tongue biting or urinary incontinence.  She admitted to having a headache but was able to ambulate after this event.  She has chronic right hip pain that is slightly worse and mid back pain.  She admitted to increased urinary frequency and urgency without dysuria or oliguria or hematuria.  No flank pain.  No chest pain or palpitations.  In ED, vital signs were stable, UA positive for UTI.  Hemoglobin 11.6, D-dimer elevated at 4.25. CTA chest showed no PE however showed spiculated and cavitary nodule in the right upper lung that appears to be slowly growing since the prior studies.   Assessment and Plan:   Syncope -Unclear cause, could be vasovagal, orthostatic or arrhythmia -2D echo showed EF of 60 to 65%, no regional wall motion abnormalities, RV SF normal -Carotid Dopplers showed 1 to 39% stenosis right and left ICA -No arrhythmias on telemetry.  Patient reported she has had 2 syncopal episodes in the past and has had Holter  monitor as well.  Recommended to follow-up with cardiology outpatient (Dr. Elease Hashimoto for Dr. Duke Salvia) -Recommended compression stockings   Headache -CT head negative for any bleeding -CT C-spine, CT T-spine negative for any fracture dislocation -Pain improving, continue Percocet as needed, no nausea vomiting, blurry vision or FND's.    Presumed acute lower UTI -Patient received IV Rocephin.  Urine culture showed no growth     Cavitating mass in right upper lung lobe -CT chest showed no PE however spiculated, cavitary nodule in the right upper lung, slowly growing since the prior studies.  Patient has underlying history of sarcoidosis although reported that it has been in remission for many years -Outpatient follow-up and consideration for navigational bronchoscopy and PET scan -Pulmonology was consulted, seen by Dr. Marchelle Gearing on 10/24, recommended 1 week of antibiotics, needs PET scan outpatient -Patient was placed on IV Rocephin and Flagyl while inpatient, transition to oral Augmentin for 1 week.  She has outpatient follow-up scheduled with Dr. Delton Coombes on 12/12/2022     Dyslipidemia,  Coronary artery disease -Continue aspirin, Plavix, Imdur, beta-blocker, statin -Patient follows cardiology, Dr. Elease Hashimoto outpatient     Chronic obstructive pulmonary disease (COPD) (HCC) -Currently no acute wheezing, continue inhalers   Chronic lymphedema bilateral LE -Venous Dopplers negative for any DVT -Recommended continue elevating lower extremities, compression stockings, diuretics likely not going to help   Diabetes mellitus type 2, NIDDM -Continue linagliptin, metformin    Obesity Estimated body  Physician Discharge Summary   Patient: Sheila Reynolds MRN: [id number] DOB: 1945/05/19  Admit date:     12/02/2022  Discharge date: 12/06/22  Discharge Physician: Thad Ranger, MD    PCP: Dorothyann Peng, MD   Recommendations at discharge:    Augmentin 875-125mg  PO BID   Discharge Diagnoses:    Syncope   Cavitating mass in right upper lung lobe     Dyslipidemia   Coronary artery disease   Chronic obstructive pulmonary disease (COPD) (HCC)   Cavitary pneumonia   Headache    Hospital Course: Patient is a 77 year old female with diabetes mellitus type 2, HTN, HLP, NSVT, CAD, GERD, sarcoidosis, OSA on CPAP presented with syncope and head trauma.  Patient was eating dinner when she got lightheaded and then suddenly passed out.  She could not remember if she was seated or standing at the time as she occasionally eats while standing.  She also believes she struck the back of her head with the chair as it was sore.  Unclear how long she was unconscious.  No tongue biting or urinary incontinence.  She admitted to having a headache but was able to ambulate after this event.  She has chronic right hip pain that is slightly worse and mid back pain.  She admitted to increased urinary frequency and urgency without dysuria or oliguria or hematuria.  No flank pain.  No chest pain or palpitations.  In ED, vital signs were stable, UA positive for UTI.  Hemoglobin 11.6, D-dimer elevated at 4.25. CTA chest showed no PE however showed spiculated and cavitary nodule in the right upper lung that appears to be slowly growing since the prior studies.   Assessment and Plan:   Syncope -Unclear cause, could be vasovagal, orthostatic or arrhythmia -2D echo showed EF of 60 to 65%, no regional wall motion abnormalities, RV SF normal -Carotid Dopplers showed 1 to 39% stenosis right and left ICA -No arrhythmias on telemetry.  Patient reported she has had 2 syncopal episodes in the past and has had Holter  monitor as well.  Recommended to follow-up with cardiology outpatient (Dr. Elease Hashimoto for Dr. Duke Salvia) -Recommended compression stockings   Headache -CT head negative for any bleeding -CT C-spine, CT T-spine negative for any fracture dislocation -Pain improving, continue Percocet as needed, no nausea vomiting, blurry vision or FND's.    Presumed acute lower UTI -Patient received IV Rocephin.  Urine culture showed no growth     Cavitating mass in right upper lung lobe -CT chest showed no PE however spiculated, cavitary nodule in the right upper lung, slowly growing since the prior studies.  Patient has underlying history of sarcoidosis although reported that it has been in remission for many years -Outpatient follow-up and consideration for navigational bronchoscopy and PET scan -Pulmonology was consulted, seen by Dr. Marchelle Gearing on 10/24, recommended 1 week of antibiotics, needs PET scan outpatient -Patient was placed on IV Rocephin and Flagyl while inpatient, transition to oral Augmentin for 1 week.  She has outpatient follow-up scheduled with Dr. Delton Coombes on 12/12/2022     Dyslipidemia,  Coronary artery disease -Continue aspirin, Plavix, Imdur, beta-blocker, statin -Patient follows cardiology, Dr. Elease Hashimoto outpatient     Chronic obstructive pulmonary disease (COPD) (HCC) -Currently no acute wheezing, continue inhalers   Chronic lymphedema bilateral LE -Venous Dopplers negative for any DVT -Recommended continue elevating lower extremities, compression stockings, diuretics likely not going to help   Diabetes mellitus type 2, NIDDM -Continue linagliptin, metformin    Obesity Estimated body  normal in structure. Aortic valve regurgitation is not visualized. No aortic stenosis is present.  5. The inferior vena cava is normal in size with greater than 50% respiratory variability, suggesting right atrial pressure of 3 mmHg. Conclusion(s)/Recommendation(s): Technically limited study due to poor sound wave transmission. FINDINGS  Left Ventricle: Left ventricular ejection fraction, by estimation, is 60 to 65%. The left ventricle has normal function. The left ventricle has no regional wall  motion abnormalities. The left ventricular internal cavity size was normal in size. There is  no left ventricular hypertrophy. Left ventricular diastolic function could not be evaluated. Right Ventricle: The right ventricular size is normal. No increase in right ventricular wall thickness. Right ventricular systolic function is normal. Left Atrium: Left atrial size was normal in size. Right Atrium: Right atrial size was normal in size. Pericardium: There is no evidence of pericardial effusion. Mitral Valve: The mitral valve is normal in structure. No evidence of mitral valve regurgitation. No evidence of mitral valve stenosis. Tricuspid Valve: The tricuspid valve is normal in structure. Tricuspid valve regurgitation is not demonstrated. No evidence of tricuspid stenosis. Aortic Valve: The aortic valve is normal in structure. Aortic valve regurgitation is not visualized. No aortic stenosis is present. Pulmonic Valve: The pulmonic valve was normal in structure. Pulmonic valve regurgitation is not visualized. No evidence of pulmonic stenosis. Aorta: The aortic root is normal in size and structure. Venous: The inferior vena cava is normal in size with greater than 50% respiratory variability, suggesting right atrial pressure of 3 mmHg. IAS/Shunts: No atrial level shunt detected by color flow Doppler.  LEFT VENTRICLE PLAX 2D LVIDd:         4.40 cm LVIDs:         3.00 cm LV PW:         0.90 cm LV IVS:        0.90 cm LVOT diam:     1.90 cm LV SV:         30 LV SV Index:   17 LVOT Area:     2.84 cm  IVC IVC diam: 1.40 cm LEFT ATRIUM         Index LA diam:    3.20 cm 1.78 cm/m  AORTIC VALVE LVOT Vmax:   53.50 cm/s LVOT Vmean:  35.200 cm/s LVOT VTI:    0.107 m  AORTA Ao Root diam: 2.70 cm Ao Asc diam:  2.90 cm  SHUNTS Systemic VTI:  0.11 m Systemic Diam: 1.90 cm Arvilla Meres MD Electronically signed by Arvilla Meres MD Signature Date/Time: 12/04/2022/3:24:46 PM    Final    VAS US CAROTID  Result Date:  12/03/2022 Carotid Arterial Duplex Study Patient Name:  Sheila Reynolds  Date of Exam:   12/03/2022 Medical Rec #: [id number]           Accession #:    [id number] Date of Birth: 1945/04/07           Patient Gender: F Patient Age:   80 years Exam [facility]  [facility] Procedure:      VAS US CAROTID Referring Phys: Valente David --------------------------------------------------------------------------------  Indications:       Syncope. Risk Factors:      Hypertension, Diabetes, coronary artery disease. Comparison Study:  No significant changes seen since prior exam 10/29/18 Performing Technologist: Shona Simpson  Examination Guidelines: A complete evaluation includes B-mode imaging, spectral Doppler, color Doppler, and power Doppler as needed of all accessible portions of each vessel. Bilateral testing is considered an  Headache improving.  No blurred vision, FND's.  Looking forward to discharge today  BP (!) 152/81 (BP Location: Right Arm)   Pulse 75   Temp 98.2 F (36.8 C) (Oral)   Resp 20   Ht 5\' 2"  (1.575 m)   Wt 76.7 kg   SpO2 100%   BMI 30.93 kg/m   Physical Exam General: Alert and oriented x 3, NAD Cardiovascular: S1 S2 clear, RRR.  Respiratory: CTAB, no wheezing, rales or rhonchi Gastrointestinal: Soft, nontender, nondistended, NBS Ext: no pedal edema bilaterally Neuro: no new deficits Psych: Normal affect    Condition at discharge: fair  The results of significant diagnostics from this hospitalization (including imaging, microbiology, ancillary and laboratory) are listed below for reference.   Imaging Studies: VAS [place] LOWER EXTREMITY VENOUS (DVT)  Result Date: 12/04/2022  Lower Venous DVT Study Patient Name:  Sheila Reynolds [facility] [facility]  Date of Exam:   12/04/2022 Medical Rec #: [id number]           Accession #:    [id number] Date of Birth: 20-Oct-1945           Patient Gender: F Patient Age:   89 years Exam Location:  [facility] Procedure:      VAS [place] LOWER EXTREMITY VENOUS (DVT) Referring Phys: Emmitte Surgeon --------------------------------------------------------------------------------  Indications: Elevated Ddimer.  Risk Factors: None identified. Limitations: Poor ultrasound/tissue interface. Comparison Study: No prior studies. Performing Technologist: Chanda Busing RVT  Examination Guidelines: A complete evaluation includes B-mode imaging, spectral Doppler, color Doppler, and power  Doppler as needed of all accessible portions of each vessel. Bilateral testing is considered an integral part of a complete examination. Limited examinations for reoccurring indications may be performed as noted. The reflux portion of the exam is performed with the patient in reverse Trendelenburg.  +---------+---------------+---------+-----------+----------+--------------+ RIGHT    CompressibilityPhasicitySpontaneityPropertiesThrombus Aging +---------+---------------+---------+-----------+----------+--------------+ CFV      Full           Yes      Yes                                 +---------+---------------+---------+-----------+----------+--------------+ SFJ      Full                                                        +---------+---------------+---------+-----------+----------+--------------+ FV Prox  Full                                                        +---------+---------------+---------+-----------+----------+--------------+ FV Mid   Full                                                        +---------+---------------+---------+-----------+----------+--------------+ FV DistalFull                                                        +---------+---------------+---------+-----------+----------+--------------+  normal in structure. Aortic valve regurgitation is not visualized. No aortic stenosis is present.  5. The inferior vena cava is normal in size with greater than 50% respiratory variability, suggesting right atrial pressure of 3 mmHg. Conclusion(s)/Recommendation(s): Technically limited study due to poor sound wave transmission. FINDINGS  Left Ventricle: Left ventricular ejection fraction, by estimation, is 60 to 65%. The left ventricle has normal function. The left ventricle has no regional wall  motion abnormalities. The left ventricular internal cavity size was normal in size. There is  no left ventricular hypertrophy. Left ventricular diastolic function could not be evaluated. Right Ventricle: The right ventricular size is normal. No increase in right ventricular wall thickness. Right ventricular systolic function is normal. Left Atrium: Left atrial size was normal in size. Right Atrium: Right atrial size was normal in size. Pericardium: There is no evidence of pericardial effusion. Mitral Valve: The mitral valve is normal in structure. No evidence of mitral valve regurgitation. No evidence of mitral valve stenosis. Tricuspid Valve: The tricuspid valve is normal in structure. Tricuspid valve regurgitation is not demonstrated. No evidence of tricuspid stenosis. Aortic Valve: The aortic valve is normal in structure. Aortic valve regurgitation is not visualized. No aortic stenosis is present. Pulmonic Valve: The pulmonic valve was normal in structure. Pulmonic valve regurgitation is not visualized. No evidence of pulmonic stenosis. Aorta: The aortic root is normal in size and structure. Venous: The inferior vena cava is normal in size with greater than 50% respiratory variability, suggesting right atrial pressure of 3 mmHg. IAS/Shunts: No atrial level shunt detected by color flow Doppler.  LEFT VENTRICLE PLAX 2D LVIDd:         4.40 cm LVIDs:         3.00 cm LV PW:         0.90 cm LV IVS:        0.90 cm LVOT diam:     1.90 cm LV SV:         30 LV SV Index:   17 LVOT Area:     2.84 cm  IVC IVC diam: 1.40 cm LEFT ATRIUM         Index LA diam:    3.20 cm 1.78 cm/m  AORTIC VALVE LVOT Vmax:   53.50 cm/s LVOT Vmean:  35.200 cm/s LVOT VTI:    0.107 m  AORTA Ao Root diam: 2.70 cm Ao Asc diam:  2.90 cm  SHUNTS Systemic VTI:  0.11 m Systemic Diam: 1.90 cm Arvilla Meres MD Electronically signed by Arvilla Meres MD Signature Date/Time: 12/04/2022/3:24:46 PM    Final    VAS US CAROTID  Result Date:  12/03/2022 Carotid Arterial Duplex Study Patient Name:  Sheila Reynolds  Date of Exam:   12/03/2022 Medical Rec #: [id number]           Accession #:    [id number] Date of Birth: 1945/04/07           Patient Gender: F Patient Age:   80 years Exam [facility] Procedure:      VAS US CAROTID Referring Phys: Valente David --------------------------------------------------------------------------------  Indications:       Syncope. Risk Factors:      Hypertension, Diabetes, coronary artery disease. Comparison Study:  No significant changes seen since prior exam 10/29/18 Performing Technologist: Shona Simpson  Examination Guidelines: A complete evaluation includes B-mode imaging, spectral Doppler, color Doppler, and power Doppler as needed of all accessible portions of each vessel. Bilateral testing is considered an  Physician Discharge Summary   Patient: Sheila Reynolds MRN: [id number] DOB: 1945/05/19  Admit date:     12/02/2022  Discharge date: 12/06/22  Discharge Physician: Thad Ranger, MD    PCP: Dorothyann Peng, MD   Recommendations at discharge:    Augmentin 875-125mg  PO BID   Discharge Diagnoses:    Syncope   Cavitating mass in right upper lung lobe     Dyslipidemia   Coronary artery disease   Chronic obstructive pulmonary disease (COPD) (HCC)   Cavitary pneumonia   Headache    Hospital Course: Patient is a 77 year old female with diabetes mellitus type 2, HTN, HLP, NSVT, CAD, GERD, sarcoidosis, OSA on CPAP presented with syncope and head trauma.  Patient was eating dinner when she got lightheaded and then suddenly passed out.  She could not remember if she was seated or standing at the time as she occasionally eats while standing.  She also believes she struck the back of her head with the chair as it was sore.  Unclear how long she was unconscious.  No tongue biting or urinary incontinence.  She admitted to having a headache but was able to ambulate after this event.  She has chronic right hip pain that is slightly worse and mid back pain.  She admitted to increased urinary frequency and urgency without dysuria or oliguria or hematuria.  No flank pain.  No chest pain or palpitations.  In ED, vital signs were stable, UA positive for UTI.  Hemoglobin 11.6, D-dimer elevated at 4.25. CTA chest showed no PE however showed spiculated and cavitary nodule in the right upper lung that appears to be slowly growing since the prior studies.   Assessment and Plan:   Syncope -Unclear cause, could be vasovagal, orthostatic or arrhythmia -2D echo showed EF of 60 to 65%, no regional wall motion abnormalities, RV SF normal -Carotid Dopplers showed 1 to 39% stenosis right and left ICA -No arrhythmias on telemetry.  Patient reported she has had 2 syncopal episodes in the past and has had Holter  monitor as well.  Recommended to follow-up with cardiology outpatient (Dr. Elease Hashimoto for Dr. Duke Salvia) -Recommended compression stockings   Headache -CT head negative for any bleeding -CT C-spine, CT T-spine negative for any fracture dislocation -Pain improving, continue Percocet as needed, no nausea vomiting, blurry vision or FND's.    Presumed acute lower UTI -Patient received IV Rocephin.  Urine culture showed no growth     Cavitating mass in right upper lung lobe -CT chest showed no PE however spiculated, cavitary nodule in the right upper lung, slowly growing since the prior studies.  Patient has underlying history of sarcoidosis although reported that it has been in remission for many years -Outpatient follow-up and consideration for navigational bronchoscopy and PET scan -Pulmonology was consulted, seen by Dr. Marchelle Gearing on 10/24, recommended 1 week of antibiotics, needs PET scan outpatient -Patient was placed on IV Rocephin and Flagyl while inpatient, transition to oral Augmentin for 1 week.  She has outpatient follow-up scheduled with Dr. Delton Coombes on 12/12/2022     Dyslipidemia,  Coronary artery disease -Continue aspirin, Plavix, Imdur, beta-blocker, statin -Patient follows cardiology, Dr. Elease Hashimoto outpatient     Chronic obstructive pulmonary disease (COPD) (HCC) -Currently no acute wheezing, continue inhalers   Chronic lymphedema bilateral LE -Venous Dopplers negative for any DVT -Recommended continue elevating lower extremities, compression stockings, diuretics likely not going to help   Diabetes mellitus type 2, NIDDM -Continue linagliptin, metformin    Obesity Estimated body  Headache improving.  No blurred vision, FND's.  Looking forward to discharge today  BP (!) 152/81 (BP Location: Right Arm)   Pulse 75   Temp 98.2 F (36.8 C) (Oral)   Resp 20   Ht 5\' 2"  (1.575 m)   Wt 76.7 kg   SpO2 100%   BMI 30.93 kg/m   Physical Exam General: Alert and oriented x 3, NAD Cardiovascular: S1 S2 clear, RRR.  Respiratory: CTAB, no wheezing, rales or rhonchi Gastrointestinal: Soft, nontender, nondistended, NBS Ext: no pedal edema bilaterally Neuro: no new deficits Psych: Normal affect    Condition at discharge: fair  The results of significant diagnostics from this hospitalization (including imaging, microbiology, ancillary and laboratory) are listed below for reference.   Imaging Studies: VAS [place] LOWER EXTREMITY VENOUS (DVT)  Result Date: 12/04/2022  Lower Venous DVT Study Patient Name:  Sheila Reynolds [facility]  Date of Exam:   12/04/2022 Medical Rec #: [id number]           Accession #:    [id number] Date of Birth: 20-Oct-1945           Patient Gender: F Patient Age:   89 years Exam Location:  [facility] Procedure:      VAS [place] LOWER EXTREMITY VENOUS (DVT) Referring Phys: Emmitte Surgeon --------------------------------------------------------------------------------  Indications: Elevated Ddimer.  Risk Factors: None identified. Limitations: Poor ultrasound/tissue interface. Comparison Study: No prior studies. Performing Technologist: Chanda Busing RVT  Examination Guidelines: A complete evaluation includes B-mode imaging, spectral Doppler, color Doppler, and power  Doppler as needed of all accessible portions of each vessel. Bilateral testing is considered an integral part of a complete examination. Limited examinations for reoccurring indications may be performed as noted. The reflux portion of the exam is performed with the patient in reverse Trendelenburg.  +---------+---------------+---------+-----------+----------+--------------+ RIGHT    CompressibilityPhasicitySpontaneityPropertiesThrombus Aging +---------+---------------+---------+-----------+----------+--------------+ CFV      Full           Yes      Yes                                 +---------+---------------+---------+-----------+----------+--------------+ SFJ      Full                                                        +---------+---------------+---------+-----------+----------+--------------+ FV Prox  Full                                                        +---------+---------------+---------+-----------+----------+--------------+ FV Mid   Full                                                        +---------+---------------+---------+-----------+----------+--------------+ FV DistalFull                                                        +---------+---------------+---------+-----------+----------+--------------+  POP      Full           Yes      Yes                                 +---------+---------------+---------+-----------+----------+--------------+ PTV      Full                                                        +---------+---------------+---------+-----------+----------+--------------+ PERO     Full                                                        +---------+---------------+---------+-----------+----------+--------------+     Summary: RIGHT: - There is no evidence of deep vein thrombosis in the lower extremity.  - No cystic structure found in the popliteal fossa.  LEFT: - There is no evidence of deep vein thrombosis in the lower extremity. However, portions of this examination were limited- see technologist comments above.  - No cystic structure found in the popliteal  fossa.  *See table(s) above for measurements and observations. Electronically signed by Heath Lark on 12/04/2022 at 5:45:06 PM.    Final    ECHOCARDIOGRAM COMPLETE  Result Date: 12/04/2022    ECHOCARDIOGRAM REPORT   Patient Name:   Sheila Reynolds Date of Exam: 12/04/2022 Medical Rec #:  [id number]          Height:       62.0 in Accession #:    [id number]         Weight:       174.2 lb Date of Birth:  1945-08-21          BSA:          1.803 m Patient Age:    77 years           BP:           119/61 mmHg Patient Gender: F                  HR:           65 bpm. Exam Location:  Inpatient Procedure: 2D Echo, Cardiac Doppler and Color Doppler Indications:    Syncope R55  History:        Patient has prior history of Echocardiogram examinations, most                 recent 09/05/2021. CAD, Arrythmias:PVC and PAC; Risk                 Factors:Dyslipidemia, Hypertension and Diabetes.  Sonographer:    Harriette Bouillon RDCS Referring Phys: [id number] JAN A MANSY IMPRESSIONS  1. Left ventricular ejection fraction, by estimation, is 60 to 65%. The left ventricle has normal function. The left ventricle has no regional wall motion abnormalities. Left ventricular diastolic function could not be evaluated.  2. Right ventricular systolic function is normal. The right ventricular size is normal.  3. The mitral valve is normal in structure. No evidence of mitral valve regurgitation. No evidence of mitral stenosis.  4. The aortic valve is

## 2022-12-06 NOTE — Plan of Care (Signed)
  Problem: Education: Goal: Knowledge of General Education information will improve Description: Including pain rating scale, medication(s)/side effects and non-pharmacologic comfort measures Outcome: Progressing   Problem: Clinical Measurements: Goal: Ability to maintain clinical measurements within normal limits will improve Outcome: Progressing Goal: Will remain free from infection Outcome: Progressing   Problem: Nutrition: Goal: Adequate nutrition will be maintained Outcome: Progressing   Problem: Pain Management: Goal: General experience of comfort will improve Outcome: Progressing   Problem: Safety: Goal: Ability to remain free from injury will improve Outcome: Progressing   Problem: Education: Goal: Knowledge of condition and prescribed therapy will improve Outcome: Progressing   Problem: Cardiac: Goal: Will achieve and/or maintain adequate cardiac output Outcome: Progressing

## 2022-12-07 ENCOUNTER — Emergency Department (HOSPITAL_COMMUNITY)
Admission: EM | Admit: 2022-12-07 | Discharge: 2022-12-07 | Disposition: A | Discharge status: Home / Self Care | Payer: Medicare Other | Attending: Emergency Medicine | Admitting: Emergency Medicine

## 2022-12-07 ENCOUNTER — Other Ambulatory Visit: Payer: Self-pay

## 2022-12-07 ENCOUNTER — Encounter (HOSPITAL_COMMUNITY): Payer: Self-pay

## 2022-12-07 ENCOUNTER — Emergency Department (HOSPITAL_COMMUNITY): Payer: Medicare Other

## 2022-12-07 DIAGNOSIS — I509 Heart failure, unspecified: Secondary | ICD-10-CM | POA: Diagnosis not present

## 2022-12-07 DIAGNOSIS — R911 Solitary pulmonary nodule: Secondary | ICD-10-CM | POA: Diagnosis not present

## 2022-12-07 DIAGNOSIS — J449 Chronic obstructive pulmonary disease, unspecified: Secondary | ICD-10-CM | POA: Diagnosis not present

## 2022-12-07 DIAGNOSIS — R519 Headache, unspecified: Secondary | ICD-10-CM | POA: Diagnosis not present

## 2022-12-07 DIAGNOSIS — I2510 Atherosclerotic heart disease of native coronary artery without angina pectoris: Secondary | ICD-10-CM | POA: Diagnosis not present

## 2022-12-07 DIAGNOSIS — R2243 Localized swelling, mass and lump, lower limb, bilateral: Secondary | ICD-10-CM | POA: Diagnosis not present

## 2022-12-07 DIAGNOSIS — Z7982 Long term (current) use of aspirin: Secondary | ICD-10-CM | POA: Diagnosis not present

## 2022-12-07 DIAGNOSIS — R079 Chest pain, unspecified: Secondary | ICD-10-CM | POA: Diagnosis not present

## 2022-12-07 DIAGNOSIS — R0789 Other chest pain: Secondary | ICD-10-CM | POA: Diagnosis not present

## 2022-12-07 LAB — CBC WITH DIFFERENTIAL/PLATELET
Abs Immature Granulocytes: 0.04 10*3/uL (ref 0.00–0.07)
Basophils Absolute: 0 10*3/uL (ref 0.0–0.1)
Basophils Relative: 0 %
Eosinophils Absolute: 0.2 10*3/uL (ref 0.0–0.5)
Eosinophils Relative: 3 %
HCT: 36.5 % (ref 36.0–46.0)
Hemoglobin: 12 g/dL (ref 12.0–15.0)
Immature Granulocytes: 1 %
Lymphocytes Relative: 20 %
Lymphs Abs: 1.7 10*3/uL (ref 0.7–4.0)
MCH: 28.2 pg (ref 26.0–34.0)
MCHC: 32.9 g/dL (ref 30.0–36.0)
MCV: 85.9 fL (ref 80.0–100.0)
Monocytes Absolute: 0.6 10*3/uL (ref 0.1–1.0)
Monocytes Relative: 8 %
Neutro Abs: 5.8 10*3/uL (ref 1.7–7.7)
Neutrophils Relative %: 68 %
Platelets: 222 10*3/uL (ref 150–400)
RBC: 4.25 MIL/uL (ref 3.87–5.11)
RDW: 13.8 % (ref 11.5–15.5)
WBC: 8.4 10*3/uL (ref 4.0–10.5)
nRBC: 0 % (ref 0.0–0.2)

## 2022-12-07 LAB — COMPREHENSIVE METABOLIC PANEL
ALT: 30 U/L (ref 0–44)
AST: 55 U/L — ABNORMAL HIGH (ref 15–41)
Albumin: 4.1 g/dL (ref 3.5–5.0)
Alkaline Phosphatase: 68 U/L (ref 38–126)
Anion gap: 8 (ref 5–15)
BUN: 21 mg/dL (ref 8–23)
CO2: 26 mmol/L (ref 22–32)
Calcium: 9.4 mg/dL (ref 8.9–10.3)
Chloride: 101 mmol/L (ref 98–111)
Creatinine, Ser: 1.01 mg/dL — ABNORMAL HIGH (ref 0.44–1.00)
GFR, Estimated: 58 mL/min — ABNORMAL LOW (ref >60)
Glucose, Bld: 120 mg/dL — ABNORMAL HIGH (ref 70–99)
Potassium: 4.5 mmol/L (ref 3.5–5.1)
Sodium: 135 mmol/L (ref 135–145)
Total Bilirubin: 0.5 mg/dL (ref 0.3–1.2)
Total Protein: 7 g/dL (ref 6.5–8.1)

## 2022-12-07 LAB — TROPONIN I (HIGH SENSITIVITY)
Troponin I (High Sensitivity): 3 ng/L (ref <18)
Troponin I (High Sensitivity): 3 ng/L (ref <18)

## 2022-12-07 LAB — LIPASE, BLOOD: Lipase: 31 U/L (ref 11–51)

## 2022-12-07 LAB — ANA: Anti Nuclear Antibody (ANA): NEGATIVE

## 2022-12-07 MED ORDER — MORPHINE SULFATE (PF) 2 MG/ML IV SOLN
2.0000 mg | Freq: Once | INTRAVENOUS | Status: AC
  Administered 2022-12-07: 2 mg via INTRAVENOUS
  Filled 2022-12-07: qty 1

## 2022-12-07 MED ORDER — CARVEDILOL 12.5 MG PO TABS: 25.0000 mg | ORAL_TABLET | Freq: Two times a day (BID) | ORAL | Status: DC

## 2022-12-07 MED ORDER — CARVEDILOL 12.5 MG PO TABS
25.0000 mg | ORAL_TABLET | Freq: Two times a day (BID) | ORAL | Status: DC
  Administered 2022-12-07: 25 mg via ORAL
  Filled 2022-12-07: qty 2

## 2022-12-07 NOTE — ED Provider Notes (Signed)
[facility] DE[facility]MENT AT [facility] Provider Note   CSN: [id number] Arrival date & time: 12/07/22  1647     History  Chief Complaint  Patient presents with   Chest Pain    Sheila Reynolds is a 77 y.o. female with a history of COPD, CAD, heart failure, and fibromyalgia presents the ED today for chest pain.  Patient reports he woke up with chest pain this morning that was in the right side of her chest and then radiated up to her neck and then down her right arm.  Patient states that she felt associated back pain and generalized weakness.  Patient states that she did not take her blood pressure medications this morning due to the chest pain.  Pain is not worse with deep inspiration or with movement.  No associated shortness of breath. Yesterday, she was discharged from the hospital due to a prior syncopal event.  States that she hit the back of her head with the fall at that time and reports continued pain without any neuro focal deficits, patient changes, slurred speech, or ataxia. She additionally reports continued back pain from the fall.     Home Medications Prior to Admission medications   Medication Sig Start Date End Date Taking? Authorizing Provider  acetaminophen (TYLENOL) 500 MG tablet Take 1,000 mg by mouth 2 (two) times daily as needed for moderate pain or headache.    [provider]  albuterol (PROVENTIL) (2.5 MG/3ML) 0.083% nebulizer solution Take 3 mLs (2.5 mg total) by nebulization daily as needed for wheezing or shortness of breath. 04/01/21   Arnette Felts, FNP  albuterol (VENTOLIN HFA) 108 (90 Base) MCG/ACT inhaler Inhale 2 puffs into the lungs every 6 (six) hours as needed for wheezing or shortness of breath. 12/30/19   Waymon Budge, MD  amoxicillin-clavulanate (AUGMENTIN) 875-125 MG tablet Take 1 tablet by mouth 2 (two) times daily for 7 days. 12/06/22 12/13/22  Rai, Ripudeep K, MD  ANORO ELLIPTA 62.5-25 MCG/ACT AEPB INHALE 1 PUFF  INTO THE LUNGS DAILY AT 6 AM 02/21/22   [name], Colbert Coyer, MD  aspirin EC 81 MG tablet Take 81 mg by mouth daily. Swallow whole.    [provider]  atorvastatin (LIPITOR) 80 MG tablet Take 1 tablet (80 mg total) by mouth daily. 08/23/21   [name], Deloris Ping, MD  Blood Glucose Monitoring Suppl (ACCU-CHEK AVIVA PLUS) w/Device KIT Use to check blood sugars 3 times a day. Dx code e11.65 01/03/20   Dorothyann Peng, MD  brimonidine (ALPHAGAN P) 0.1 % SOLN Place 1 drop into both eyes in the morning, at noon, and at bedtime.    [provider]  brinzolamide (AZOPT) 1 % ophthalmic suspension Place 1 drop into both eyes 3 (three) times daily.    [provider]  carvedilol (COREG) 25 MG tablet Take 1 tablet (25 mg total) by mouth 2 (two) times daily. 01/01/22   Corky Crafts, MD  cetirizine (ZYRTEC ALLERGY) 10 MG tablet Take 1 tablet (10 mg total) by mouth daily. 10/18/21 12/03/22  Dorothyann Peng, MD  clopidogrel (PLAVIX) 75 MG tablet TAKE 1 TABLET(75 MG) BY MOUTH DAILY 10/09/22   [name], Deloris Ping, MD  EPINEPHrine 0.3 mg/0.3 mL IJ SOAJ injection Inject 0.3 mg into the muscle as needed for anaphylaxis. 06/05/22   Dorothyann Peng, MD  ezetimibe (ZETIA) 10 MG tablet Take 1 tablet (10 mg total) by mouth daily. 08/23/21   [name], Deloris Ping, MD  furosemide (LASIX) 40 MG tablet  Take 1 tablet (40 mg total) by mouth daily as needed. 03/28/22   [name], Deloris Ping, MD  isosorbide mononitrate (IMDUR) 60 MG 24 hr tablet TAKE 1 TABLET(60 MG) BY MOUTH DAILY 09/16/22   [name], Deloris Ping, MD  JANUMET 50-500 MG tablet TAKE 1 TABLET BY MOUTH TWICE DAILY WITH A MEAL 10/10/22   Dorothyann Peng, MD  ketorolac (ACULAR) 0.5 % ophthalmic solution Place 1 drop into the right eye 3 (three) times daily. 08/02/20   [provider]  Lancets (ONETOUCH DELICA PLUS LANCET33G) MISC CHECK BLOOD SUGAR BEFORE BREAKFAST AND DINNER 09/12/20   Charlesetta Ivory, NP  loperamide (IMODIUM) 2 MG capsule Take 1 capsule (2 mg total) by  mouth 4 (four) times daily as needed for diarrhea or loose stools. 11/07/22   Sabas Sous, MD  montelukast (SINGULAIR) 10 MG tablet TAKE 1 TABLET BY MOUTH EVERY DAY 02/17/22   Dorothyann Peng, MD  Nebulizers (COMPRESSOR/NEBULIZER) MISC Use as directed 04/22/18   Waymon Budge, MD  nitroGLYCERIN (NITROSTAT) 0.4 MG SL tablet Place 1 tablet (0.4 mg total) under the tongue every 5 (five) minutes as needed for chest pain. 07/25/19 09/21/23  Dorothyann Peng, MD  ondansetron (ZOFRAN-ODT) 4 MG disintegrating tablet Take 1 tablet (4 mg total) by mouth every 8 (eight) hours as needed for nausea or vomiting. 12/06/22   Rai, Delene Ruffini, MD  oxyCODONE-acetaminophen (PERCOCET/ROXICET) 5-325 MG tablet Take 1 tablet by mouth every 6 (six) hours as needed for up to 7 days for moderate pain (pain score 4-6). 12/06/22 12/13/22  Rai, Ripudeep Kirtland Bouchard, MD  pantoprazole (PROTONIX) 40 MG tablet Take 1 tablet (40 mg total) by mouth daily. 09/19/22   [name], Deloris Ping, MD  Polyvinyl Alcohol-Povidone PF (REFRESH) 1.4-0.6 % SOLN Place 1-2 drops into both eyes 3 (three) times daily as needed (dry/irritated eyes.).    [provider]  potassium chloride SA (KLOR-CON) 20 MEQ tablet Take 1 tablet (20 mEq total) by mouth every other day. 06/16/19   [name], Deloris Ping, MD  prednisoLONE acetate (PRED FORTE) 1 % ophthalmic suspension Place 2-3 drops into both eyes in the morning, at noon, and at bedtime. Place 2 Drops in Left Eye, Place 3 Drops In Right Eye TID 10/11/20   [provider]  pregabalin (LYRICA) 75 MG capsule TAKE 1 CAPSULE(75 MG) BY MOUTH TWICE DAILY 08/05/22   Dorothyann Peng, MD  RHOPRESSA 0.02 % SOLN Place 1 drop into the left eye at bedtime. 05/27/21   [provider]  tiZANidine (ZANAFLEX) 4 MG tablet TAKE 1 TABLET(4 MG) BY MOUTH DAILY Patient taking differently: Take 4 mg by mouth every 6 (six) hours as needed for muscle spasms. 05/05/22   Dorothyann Peng, MD      Allergies    Crestor [rosuvastatin calcium],  Demerol  [meperidine hcl], and Shellfish allergy    Review of Systems   Review of Systems  Cardiovascular:  Positive for chest pain.  All other systems reviewed and are negative.   Physical Exam Updated Vital Signs BP (!) 179/82   Pulse 71   Temp 97.7 F (36.5 C) (Oral)   Resp 19   Ht 5\' 2"  (1.575 m)   Wt 77.1 kg   SpO2 100%   BMI 31.09 kg/m  Physical Exam Vitals and nursing note reviewed.  Constitutional:      General: She is not in acute distress.    Appearance: Normal appearance.  HENT:     Head: Normocephalic and atraumatic.     Comments:  Tenderness to palpation of the occipital region of head.    Mouth/Throat:     Mouth: Mucous membranes are moist.  Eyes:     Conjunctiva/sclera: Conjunctivae normal.     Pupils: Pupils are equal, round, and reactive to light.  Cardiovascular:     Rate and Rhythm: Normal rate and regular rhythm.     Pulses: Normal pulses.     Heart sounds: Normal heart sounds.  Pulmonary:     Effort: Pulmonary effort is normal.     Breath sounds: Normal breath sounds.  Abdominal:     Palpations: Abdomen is soft.     Tenderness: There is no abdominal tenderness.  Musculoskeletal:        General: No tenderness. Normal range of motion.     Cervical back: Normal range of motion. No rigidity or tenderness.     Right lower leg: Edema present.     Left lower leg: Edema present.     Comments: Strength and sensation of upper and lower extremities remain intact.  Chronic swelling of the lower extremities bilaterally.  Dorsalis pedis pulses intact.  Skin:    General: Skin is warm and dry.     Findings: No rash.  Neurological:     General: No focal deficit present.     Mental Status: She is alert.     Sensory: No sensory deficit.     Motor: No weakness.  Psychiatric:        Mood and Affect: Mood normal.        Behavior: Behavior normal.        Thought Content: Thought content normal.        Judgment: Judgment normal.    ED Results / Procedures /  Treatments   Labs (all labs ordered are listed, but only abnormal results are displayed) Labs Reviewed  COMPREHENSIVE METABOLIC PANEL - Abnormal; Notable for the following components:      Result Value   Glucose, Bld 120 (*)    Creatinine, Ser 1.01 (*)    AST 55 (*)    GFR, Estimated 58 (*)    All other components within normal limits  CBC WITH DIFFERENTIAL/PLATELET  LIPASE, BLOOD  TROPONIN I (HIGH SENSITIVITY)  TROPONIN I (HIGH SENSITIVITY)    EKG EKG Interpretation Date/Time:  Sunday December 07 2022 17:05:24 EDT Ventricular Rate:  65 PR Interval:  138 QRS Duration:  83 QT Interval:  407 QTC Calculation: 424 R Axis:   12  Text Interpretation: Sinus rhythm Confirmed by Gloris Manchester [phone]) on 12/07/2022 6:16:59 PM  Radiology DG Chest 2 View  Result Date: 12/07/2022 CLINICAL DATA:  Chest pain EXAM: CHEST - 2 VIEW COMPARISON:  December 02, 2022, Jul 07, 2018 FINDINGS: The cardiomediastinal silhouette is unchanged in contour.RIGHT apical nodule is better appreciated on recent chest CT. No pleural effusion. No pneumothorax. No acute pleuroparenchymal abnormality. Visualized abdomen is unremarkable. No acute osseous abnormality noted. IMPRESSION: 1. No acute cardiopulmonary abnormality. 2. RIGHT apical nodule is better appreciated on recent chest CT. Recommend management as per recent October 23rd CT report. Electronically Signed   By: Meda Klinefelter M.D.   On: 12/07/2022 17:56    Procedures Procedures: not indicated.   Medications Ordered in ED Medications  carvedilol (COREG) tablet 25 mg (25 mg Oral Given 12/07/22 1904)  morphine (PF) 2 MG/ML injection 2 mg (2 mg Intravenous Given 12/07/22 1904)    ED Course/ Medical Decision Making/ A&P  Medical Decision Making Risk Prescription drug management.   This patient presents to the ED for concern of chest pain, this involves an extensive number of treatment options, and is a complaint that  carries with it a high risk of complications and morbidity.   Differential diagnosis includes: ACS, pneumonia, pneumothorax, pleural effusion, dehydration, electrolyte derangement, etc.   Comorbidities  See HPI above   Additional History  Additional history obtained from prior hospitalization notes.   Cardiac Monitoring / EKG  The patient was maintained on a cardiac monitor.  I personally viewed and interpreted the cardiac monitored which showed: sinus rhythm with a heart rate of 65 bpm.   Lab Tests  I ordered and personally interpreted labs.  The pertinent results include:   Troponin of 3 CBC, lipase, and CMP are within normal limits - no acute electro abnormality, AKI, transaminitis, infection, or anemia.   Imaging Studies  I ordered imaging studies including CXR  I independently visualized and interpreted imaging which showed: No acute cardiopulmonary abnormality.  Right apical nodule is better appreciated on recent CT.  Recommend management as per recent October 2032 report. I agree with the radiologist interpretation With shared decision making, patient, her family, and I agreed repeat imaging of the thoracic spine is not needed at this time.   Problem List / ED Course / Critical Interventions / Medication Management  Chest pain I ordered medications including: Carvedilol for BP - BP 168/66 on reevaluation Morphine for pain Reevaluation of the patient after these medicines showed that the patient improved I have reviewed the patients home medicines and have made adjustments as needed   Social Determinants of Health  Physical activity   Test / Admission - Considered  Discussed findings with patient and family at bedside.  All questions were answered. She is hemodynamically stable and safe for discharge home. Return precautions provided.       Final Clinical Impression(s) / ED Diagnoses Final diagnoses:  Chest pain, unspecified type    Rx / DC  Orders ED Discharge Orders     None         Maxwell Marion, PA-C 12/07/22 2006    Gloris Manchester, MD 12/07/22 2352

## 2022-12-07 NOTE — Discharge Instructions (Signed)
As discussed, take your evening medications aside from the carvedilol tonight.  You can resume that tomorrow as it was given in the ED tonight.  Call your cardiologist to follow-up for reevaluation of your atypical chest pain.  Get help right away if: You feel sick to your stomach (nauseous) or you throw up (vomit). You feel sweaty or light-headed. You have a cough with mucus from your lungs (sputum) or you cough up blood. You are short of breath.

## 2022-12-07 NOTE — ED Triage Notes (Signed)
Pt presents with c/o chest pain that started yesterday. Pt reports the pain is in the right upper part of her chest.

## 2022-12-07 NOTE — ED Provider Triage Note (Signed)
Emergency Medicine Provider Triage Evaluation Note  LELANI CADWELL , a 77 y.o. female  was evaluated in triage.  Pt complains of Cp, sob, pain goes into head.  Discharged yesterday from hospital for syncope.  Pain to right side of chest.  As well as some upper abdominal pain.  Some nausea that vomiting.  No numbness or weakness  Review of Systems  Positive: Headache, chest pain, abdominal pain Negative: Numbness, weakness, emesis  Physical Exam  BP (!) 183/100 (BP Location: Left Arm)   Pulse 64   Temp 97.7 F (36.5 C) (Oral)   Resp 18   Ht 5\' 2"  (1.575 m)   Wt 77.1 kg   SpO2 100%   BMI 31.09 kg/m  Gen:   Awake, no distress   Resp:  Normal effort  MSK:   Moves extremities without difficulty  Other:    Medical Decision Making  Medically screening exam initiated at 5:17 PM.  Appropriate orders placed.  Pamala H Hucker was informed that the remainder of the evaluation will be completed by another provider, this initial triage assessment does not replace that evaluation, and the importance of remaining in the ED until their evaluation is complete.  pain   Trig Mcbryar A, PA-C 12/07/22 1717

## 2022-12-09 ENCOUNTER — Ambulatory Visit (INDEPENDENT_AMBULATORY_CARE_PROVIDER_SITE_OTHER): Payer: Medicare Other | Admitting: Neurology

## 2022-12-09 ENCOUNTER — Encounter: Payer: Self-pay | Admitting: Neurology

## 2022-12-09 ENCOUNTER — Ambulatory Visit: Payer: Medicare Other | Admitting: Neurology

## 2022-12-09 VITALS — BP 103/63 | HR 77 | Ht 62.0 in | Wt 166.0 lb

## 2022-12-09 DIAGNOSIS — G441 Vascular headache, not elsewhere classified: Secondary | ICD-10-CM

## 2022-12-09 DIAGNOSIS — S060X0S Concussion without loss of consciousness, sequela: Secondary | ICD-10-CM | POA: Diagnosis not present

## 2022-12-09 DIAGNOSIS — R55 Syncope and collapse: Secondary | ICD-10-CM

## 2022-12-09 NOTE — Patient Instructions (Signed)
I had a long discussion with the patient and her daughter regarding her recent episode of syncope with concussion and persistent symptoms of daily headaches and dizziness which likely a postconcussive symptoms.  I recommend further evaluation with checking EEG for seizure activity.  Continue Tylenol 3 but limit to not more than 2 doses a day to avoid analgesic rebound.  I recommend she do regular neck stretching exercises and use local heat application to help with her shoulder and neck pain.  Continue memory compensation strategies and participation in cognitively challenging activities like solving crossword puzzles, playing bridge, word searches and sudoku.  Continue aspirin for stroke prevention and maintain aggressive risk factor modification.  I advised her to use a cane at all times and avoid sudden and quick neck movements.  Return for follow-up in the future in 3 months with nurse practitioner or call earlier if necessary.  Neck Exercises Ask your health care provider which exercises are safe for you. Do exercises exactly as told by your health care provider and adjust them as directed. It is normal to feel mild stretching, pulling, tightness, or discomfort as you do these exercises. Stop right away if you feel sudden pain or your pain gets worse. Do not begin these exercises until told by your health care provider. Neck exercises can be important for many reasons. They can improve strength and maintain flexibility in your neck, which will help your upper back and prevent neck pain. Stretching exercises Rotation neck stretching  Sit in a chair or stand up. Place your feet flat on the floor, shoulder-width apart. Slowly turn your head (rotate) to the right until a slight stretch is felt. Turn it all the way to the right so you can look over your right shoulder. Do not tilt or tip your head. Hold this position for 10-30 seconds. Slowly turn your head (rotate) to the left until a slight stretch is  felt. Turn it all the way to the left so you can look over your left shoulder. Do not tilt or tip your head. Hold this position for 10-30 seconds. Repeat __________ times. Complete this exercise __________ times a day. Neck retraction  Sit in a sturdy chair or stand up. Look straight ahead. Do not bend your neck. Use your fingers to push your chin backward (retraction). Do not bend your neck for this movement. Continue to face straight ahead. If you are doing the exercise properly, you will feel a slight sensation in your throat and a stretch at the back of your neck. Hold the stretch for 1-2 seconds. Repeat __________ times. Complete this exercise __________ times a day. Strengthening exercises Neck press  Lie on your back on a firm bed or on the floor with a pillow under your head. Use your neck muscles to push your head down on the pillow and straighten your spine. Hold the position as well as you can. Keep your head facing up (in a neutral position) and your chin tucked. Slowly count to 5 while holding this position. Repeat __________ times. Complete this exercise __________ times a day. Isometrics These are exercises in which you strengthen the muscles in your neck while keeping your neck still (isometrics). Sit in a supportive chair and place your hand on your forehead. Keep your head and face facing straight ahead. Do not flex or extend your neck while doing isometrics. Push forward with your head and neck while pushing back with your hand. Hold for 10 seconds. Do the sequence again, this  time putting your hand against the back of your head. Use your head and neck to push backward against the hand pressure. Finally, do the same exercise on either side of your head, pushing sideways against the pressure of your hand. Repeat __________ times. Complete this exercise __________ times a day. Prone head lifts  Lie face-down (prone position), resting on your elbows so that your chest and  upper back are raised. Start with your head facing downward, near your chest. Position your chin either on or near your chest. Slowly lift your head upward. Lift until you are looking straight ahead. Then continue lifting your head as far back as you can comfortably stretch. Hold your head up for 5 seconds. Then slowly lower it to your starting position. Repeat __________ times. Complete this exercise __________ times a day. Supine head lifts  Lie on your back (supine position), bending your knees to point to the ceiling and keeping your feet flat on the floor. Lift your head slowly off the floor, raising your chin toward your chest. Hold for 5 seconds. Repeat __________ times. Complete this exercise __________ times a day. Scapular retraction  Stand with your arms at your sides. Look straight ahead. Slowly pull both shoulders (scapulae) backward and downward (retraction) until you feel a stretch between your shoulder blades in your upper back. Hold for 10-30 seconds. Relax and repeat. Repeat __________ times. Complete this exercise __________ times a day. Contact a health care provider if: Your neck pain or discomfort gets worse when you do an exercise. Your neck pain or discomfort does not improve within 2 hours after you exercise. If you have any of these problems, stop exercising right away. Do not do the exercises again unless your health care provider says that you can. Get help right away if: You develop sudden, severe neck pain. If this happens, stop exercising right away. Do not do the exercises again unless your health care provider says that you can. This information is not intended to replace advice given to you by your health care provider. Make sure you discuss any questions you have with your health care provider. Document Revised: 07/24/2020 Document Reviewed: 07/24/2020 Elsevier Patient Education  2024 [employer].

## 2022-12-09 NOTE — Progress Notes (Signed)
[facility] [address] [place]. [place] [address] [phone]       OFFICE FOLLOW UP NOTE  [name] Date of Birth:  1945/10/06 Medical Record Number:  [id number]   Referring MD:  Dorothyann Peng  Reason for visit: Memory loss  Chief Complaint  Patient presents with   Follow-up    Rm 22, here with daughter Sheila Reynolds  Pt is here following up on seizures. Pt states she blacked out on 12/03/2022, went to the ER and states there was no diagnosis was given. States she has had 2 episodes since April/2024. Pt states she also started with dizziness today.      HPI:  Update 12/09/2022 : She returns for follow-up after last visit 6 months ago.  She is accompanied by her daughter.  Patient states she was admitted to the hospital on 12/02/2022 with suspected syncopal event.  She states that she was home alone she tried to get up and felt nauseous then she must of lost consciousness.  She came to on the floor wedged beneath the sofa in the carpet.  She was unable to get up for 2 hours till the husband came and found her.  She had hit the back of her head and had a bruise.  She felt dizzy and was stumbling and off-balance as well as had a headache.  In the ER she was found to have no evidence of pulmonary embolism or significant fractures.  She is found to have mild UTI.  She was discharged on antibiotics and on 10 with codeine.  She continues to have daily headaches which are mild to moderate.  She takes 1 or 2 tablets of Tylenol 3 which seems to help.  She also feels she has significant stiffness of her shoulder which she had hurt on the right as well as back of the neck.  She denies true vertigo but feels she is off balance.  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  No acute findings.  EEG on 06/09/2022 was normal.  Recent  echocardiogram in 11/30/2022 showed ejection fraction of 60 to 65%.  Left atrial size was normal.  Hemoshe is is free globin A1c on 09/22/2022 was 6.3.  I recommended Keppra for seizure prophylaxis at last visit with patient took it only for short while and stopped taking it. Update 05/15/2022 : She returns for follow-up after last visit 8 months ago.  She is accompanied by her daughter Sheila Reynolds.  Patient continues to have mild cognitive and short-term memory difficulties.  She has good days and bad days.  She may often remember things later.  Is quite independent in activities of daily living.  She can drive and cook.  Last month she had an episode where her granddaughter noticed that she was staring and had a spacey look on her face.  She is not responding when spoken to and following commands.  She remains confused and disoriented for short time.  daughter she has had at least 3 Episodes.  One of them occurred in the setting of urinary tract infection while admitted to outside hospital.  No episodes later on the same day as the last 1.  Review of ER visit note from 05/07/2022 states that the patient had altered mental status possibly presyncopal event.  CT scan of the head showed chronic white matter changes  and atrophy changes without acute abnormality.  UA, CBC and BMP labs unremarkable.  Review of [facility] admission note from 11/04/2021 mentions reason for admission as drowsiness and dizziness with urinary tract infection and pneumonitis no mention of seizure-like episode.  CT head during that admission also showed no acute abnormalities.  Patient has no prior known history of seizures or episodes of loss of consciousness or significant head injury.  On cognitive testing today she did quite well and scored 27/30 on MMSE this however slight decline from 30/30 last visit on 09/18/2021.  Update 09/18/2021 JM: Patient scheduled today's visit after prior visit with Dr. Pearlean Brownie 10 months ago for concern of  memory loss and brain fog.   Feels like having a harder time "registering" what is being said to her during conversation. She has little motivation to do activity or do prior things she was interested in such as going to see a movie or going to a football game. She still manages/rents houses, has had a hard time getting things done such as repairs either due to lack of motivation or will forget she was supposed to do certain tasks.  She does admit to poor sleep, wakes up frequently throughout the night, at times only gets 3-4 hrs.  Admits to intermittent use of CPAP as she has a difficulty tolerating.  Plans on repeating HST to see if apnea still present as initial AHI low.  She does admit to increased stressors at home with family as well as likely underlying depression.  Geriatric depression score 9.  She voices frustration regarding her overall health and multiple health issues.  Does report her father had dementia which did not develop towards end-of-life while in nursing home.  No other family history of dementia.  She is greatly concerned/worried that she has developed dementia.  MMSE today 30/30 (prior 27/30 03/2020) Extensive evaluation back in 03/2020 for complaints of memory loss largely unremarkable PCP completed B12 and TSH which were normal.   Reports headaches have been off and on. Usually has 1 headache per week but not always severe. She believes majority are coming from her poor eyesight.  She is being closely followed by ophthalmology.  No change in headache frequency or characteristics since prior visit. She stopped taking topamax since prior visit as she did not feel it was helping her headaches. She was seen in ED 1/17 with dizziness and headache with workup largely unremarkable and headache improved after migraine cocktail.  Towards the end of visit, she also mentions continued poor balance which is not a new issue and denies any worsening.  She questions trying physical therapy for  possible benefit.  No other concerns at this time      History provided for reference purposes only Update 11/15/2020 Dr. Pearlean Brownie She returns for follow-up after last visit with Shanda Bumps nurse practitioner on 09/04/2020.  She states headaches are slightly better but still occur 2-3 times per week particularly at night she often has to wake up.  She has to take 2 tablets of Tylenol which seems to help and she is able to go back to sleep.  She also has to put prednisone drops into her eyes which also helped.  She did try Topamax and did all right for a while she was taking 1 tablet a day and it seemed to help but when she increase it to 2 tablets a day it made her quite sleepy and hence she stopped it after 2 weeks.  She does  complain of some posterior neck muscle tightness and pain which she has not been doing any neck stretching exercises.  She continues to have mild gait and balance difficulties but these are longstanding and unchanged.  She has no new complaints.   Update 09/03/2020 JM: Ms. Jeremiah returns for sooner scheduled visit for headaches. She reports being seen by her ophthalmologist who advised her to follow-up with our office for further evaluation.  Extensive work-up completed after prior visit in 04/2020 including MR brain, MR cervical, EEG and lab work which was all unremarkable.  Reports over the past 3 months, she has been experiencing left orbital and periorbital headaches.  Initially experiencing them daily but have since been gradually improving.  She may experience dull headache daily but at times can be severe throbbing headache accompanied by blurred vision but denies actual loss of vision.  She was evaluated by her ophthalmologist with history of open-angle glaucoma bilaterally with recent exam normal IOP bilaterally.  She also has chronic iritis bilaterally with use of prednisone drops which she reports will improve orbital headache.  She also intermittently use Tylenol with benefit.   Denies photophobia, phonophobia or N/V.  She was seen by her PCP who did lab work on 6/27 with a normal CRP and sed rate.  Update 04/03/2020 Dr. Pearlean Brownie: She returns for follow-up after last visit year and half ago.  She has new complaints of worsening balance, dizziness, memory as well as headaches and neck pain.  She states her balance is poor and she has had a few falls but no injuries.  She has to be careful and often can catch her self and does not fall.  She denies any significant neck pain radiating down her spine but does complain of tightness in the neck and trouble moving her neck.  She has had no recent neck x-rays or scans done.  She also complains of posterior headaches as well as bifrontal headaches which are intermittent and last only few minutes.  She takes some Tylenol which seems to help.  Cold compression also seems to help her headaches.  She did undergo some carotid ultrasound at last visit after she saw me on 10/29/2018 which showed no significant extracranial stenosis and transcranial Doppler study was also done which showed poor left improvement of but normal velocities in the vessels that could be studied.  On inquiry she admits to numbness in the right hand which is intermittent but more in the night.  She does agree that the numbness seems to more prominent when she performs activities with rapid repetitive wrist flexion.  Initial visit 10/20/2018 Dr. Pearlean Brownie: Ms. Backer is a 76 year old African-American lady seen today for initial office consultation visit for stroke.  She is accompanied by her husband today.  History is obtained from them and review of referral notes and have personally reviewed imaging films in PACS.  The patient had been having steady decline in the vision acuity in the right eye and for which she was seen by neuro-ophthalmology recently.  They ordered an MRI of the brain and orbits which was done on 10/05/2018 which I personally reviewed MRI of the orbits was unremarkable  but MRI scan of the brain showed a small left thalamic acute infarct.  Patient had no neurological symptoms at that time and this is likely an incidental finding.  Patient denied any symptoms of right-sided numbness, tingling, weakness gait or balance problems or memory problems.  She thought a few days prior to her MRI she  had some twitchings in the left side of the face but this lasted only a few hours and recovered.  She denied any increased watering of the eyes lack of sensation or weakness.  Patient has no prior history of strokes, TIAs, seizures, migraines or significant neurological problems.  She had lab work on 10/07/2018 which showed LDL cholesterol of 71 mg percent.  She takes Lipitor 40 mg daily.  She had a hemoglobin A1c of 6.1 on 07/20/2018.  Her blood pressure is usually well controlled and today it is 134/77.  Her fasting sugars have all been good in the 110 range.  She also has sleep apnea and is quite compliant with her CPAP and takes it every night.  She has not had any vascular imaging studies done for the brain of the neck.  She has no neurological complaints today.  ROS:   14 system review of systems is positive for those listed in HPI and all other systems negative e and all other systems negative  PMH:  Past Medical History:  Diagnosis Date   Anemia    3 months ago anemic   Anxiety    on meds   Arthritis    "all over" (01/15/2017)   Asthma    Bronchitis with emphysema    Chest pain    Chronic bronchitis (HCC)    Coronary artery disease    a. 01/2017 she underwent orbital atherectomy/DES to the proxmal LAD and PTCA to ostial D2. 2D Echo 01/15/17 showed mild LVH, EF 60-65%, grade 1 DD.   Family history of anesthesia complication    daughter N/V   Fibromyalgia    GERD (gastroesophageal reflux disease)    on meds   Glaucoma    Heart murmur    History of hiatal hernia    Hx of echocardiogram    Echo (03/2013):  Tech limited; Mild focal basal septal hypertrophy, EF 60-65%,  normal RVF   Hyperlipidemia    Hypertension    Nonsustained ventricular tachycardia (HCC)    OSA on CPAP    Premature atrial contractions    PVC (premature ventricular contraction)    a. Holter 12/16: NSR, occ PAC,PVCs   Sarcoidosis    Type II diabetes mellitus (HCC)     Social History:  Social History   Socioeconomic History   Marital status: Married    Spouse name: Thayer Ohm   Number of children: 2   Years of education: Not on file   Highest education level: Not on file  Occupational History   Occupation: unemployed    Employer: [employer]  Tobacco Use   Smoking status: Former    Current packs/day: 0.00    Average packs/day: 1 pack/day for 4.0 years (4.0 ttl pk-yrs)    Types: Cigarettes    Start date: 02/11/1976    Quit date: 02/11/1980    Years since quitting: 42.8   Smokeless tobacco: Never  Vaping Use   Vaping status: Never Used  Substance and Sexual Activity   Alcohol use: No   Drug use: No   Sexual activity: Yes  Other Topics Concern   Not on file  Social History Narrative   Lives with husband   Right hand   Drinks 2-3 cups caffeine daily   Social Determinants of Health   Financial Resource Strain: Low Risk  (04/30/2022)   Overall Financial Resource Strain (CARDIA)    Difficulty of Paying Living Expenses: Not hard at all  Food Insecurity: No Food Insecurity (12/04/2022)  Hunger Vital Sign    Worried About Running Out of Food in the Last Year: Never true    Ran Out of Food in the Last Year: Never true  Transportation Needs: No Transportation Needs (12/04/2022)   PRAPARE - Administrator, Civil Service (Medical): No    Lack of Transportation (Non-Medical): No  Physical Activity: Inactive (04/30/2022)   Exercise Vital Sign    Days of Exercise per Week: 0 days    Minutes of Exercise per Session: 0 min  Stress: Stress Concern Present (04/30/2022)   [employer] of Occupational Health - Occupational Stress Questionnaire     Feeling of Stress : To some extent  Social Connections: Unknown (11/04/2021)   Received from [facility]   Social Network    Social Network: Not on file  Intimate Partner Violence: Not At Risk (12/04/2022)   Humiliation, Afraid, Rape, and Kick questionnaire    Fear of Current or Ex-Partner: No    Emotionally Abused: No    Physically Abused: No    Sexually Abused: No    Medications:   Current Outpatient Medications on File Prior to Visit  Medication Sig Dispense Refill   acetaminophen (TYLENOL) 500 MG tablet Take 1,000 mg by mouth 2 (two) times daily as needed for moderate pain or headache.     albuterol (PROVENTIL) (2.5 MG/3ML) 0.083% nebulizer solution Take 3 mLs (2.5 mg total) by nebulization daily as needed for wheezing or shortness of breath. 75 mL 12   albuterol (VENTOLIN HFA) 108 (90 Base) MCG/ACT inhaler Inhale 2 puffs into the lungs every 6 (six) hours as needed for wheezing or shortness of breath. 18 g 12   amoxicillin-clavulanate (AUGMENTIN) 875-125 MG tablet Take 1 tablet by mouth 2 (two) times daily for 7 days. 14 tablet 0   ANORO ELLIPTA 62.5-25 MCG/ACT AEPB INHALE 1 PUFF INTO THE LUNGS DAILY AT 6 AM 60 each 3   aspirin EC 81 MG tablet Take 81 mg by mouth daily. Swallow whole.     atorvastatin (LIPITOR) 80 MG tablet Take 1 tablet (80 mg total) by mouth daily. 90 tablet 3   Blood Glucose Monitoring Suppl (ACCU-CHEK AVIVA PLUS) w/Device KIT Use to check blood sugars 3 times a day. Dx code e11.65 1 kit 3   brimonidine (ALPHAGAN P) 0.1 % SOLN Place 1 drop into both eyes in the morning, at noon, and at bedtime.     brinzolamide (AZOPT) 1 % ophthalmic suspension Place 1 drop into both eyes 3 (three) times daily.     carvedilol (COREG) 25 MG tablet Take 1 tablet (25 mg total) by mouth 2 (two) times daily. 180 tablet 3   cetirizine (ZYRTEC ALLERGY) 10 MG tablet Take 1 tablet (10 mg total) by mouth daily. 30 tablet 2   clopidogrel (PLAVIX) 75 MG tablet TAKE 1 TABLET(75  MG) BY MOUTH DAILY 90 tablet 2   EPINEPHrine 0.3 mg/0.3 mL IJ SOAJ injection Inject 0.3 mg into the muscle as needed for anaphylaxis. 1 each 0   ezetimibe (ZETIA) 10 MG tablet Take 1 tablet (10 mg total) by mouth daily. 90 tablet 3   furosemide (LASIX) 40 MG tablet Take 1 tablet (40 mg total) by mouth daily as needed. 90 tablet 1   isosorbide mononitrate (IMDUR) 60 MG 24 hr tablet TAKE 1 TABLET(60 MG) BY MOUTH DAILY 90 tablet 2   JANUMET 50-500 MG tablet TAKE 1 TABLET BY MOUTH TWICE DAILY WITH A MEAL 90 tablet 1  ketorolac (ACULAR) 0.5 % ophthalmic solution Place 1 drop into the right eye 3 (three) times daily.     Lancets (ONETOUCH DELICA PLUS LANCET33G) MISC CHECK BLOOD SUGAR BEFORE BREAKFAST AND DINNER 100 each 1   loperamide (IMODIUM) 2 MG capsule Take 1 capsule (2 mg total) by mouth 4 (four) times daily as needed for diarrhea or loose stools. 12 capsule 0   montelukast (SINGULAIR) 10 MG tablet TAKE 1 TABLET BY MOUTH EVERY DAY 90 tablet 1   Nebulizers (COMPRESSOR/NEBULIZER) MISC Use as directed 1 each 0   nitroGLYCERIN (NITROSTAT) 0.4 MG SL tablet Place 1 tablet (0.4 mg total) under the tongue every 5 (five) minutes as needed for chest pain. 25 tablet prn   ondansetron (ZOFRAN-ODT) 4 MG disintegrating tablet Take 1 tablet (4 mg total) by mouth every 8 (eight) hours as needed for nausea or vomiting. 20 tablet 0   oxyCODONE-acetaminophen (PERCOCET/ROXICET) 5-325 MG tablet Take 1 tablet by mouth every 6 (six) hours as needed for up to 7 days for moderate pain (pain score 4-6). 28 tablet 0   pantoprazole (PROTONIX) 40 MG tablet Take 1 tablet (40 mg total) by mouth daily. 90 tablet 2   Polyvinyl Alcohol-Povidone PF (REFRESH) 1.4-0.6 % SOLN Place 1-2 drops into both eyes 3 (three) times daily as needed (dry/irritated eyes.).     potassium chloride SA (KLOR-CON) 20 MEQ tablet Take 1 tablet (20 mEq total) by mouth every other day. 90 tablet 1   prednisoLONE acetate (PRED FORTE) 1 % ophthalmic  suspension Place 2-3 drops into both eyes in the morning, at noon, and at bedtime. Place 2 Drops in Left Eye, Place 3 Drops In Right Eye TID     pregabalin (LYRICA) 75 MG capsule TAKE 1 CAPSULE(75 MG) BY MOUTH TWICE DAILY 180 capsule 1   RHOPRESSA 0.02 % SOLN Place 1 drop into the left eye at bedtime.     tiZANidine (ZANAFLEX) 4 MG tablet TAKE 1 TABLET(4 MG) BY MOUTH DAILY (Patient taking differently: Take 4 mg by mouth every 6 (six) hours as needed for muscle spasms.) 30 tablet 1   No current facility-administered medications on file prior to visit.    Allergies:   Allergies  Allergen Reactions   Crestor [Rosuvastatin Calcium] Other (See Comments)    muscle aches   Demerol  [Meperidine Hcl]     Other reaction(s): Hallucinations   Shellfish Allergy Itching and Other (See Comments)    Crab, shrimp and lobster ---lips itch and tingle Was told not to eat again after having a allergy test. Lobster, crab and shrimp     Physical Exam Today's Vitals   12/09/22 1500 12/09/22 1507  BP: 120/73 103/63  Pulse: 68 77  Weight: 166 lb (75.3 kg)   Height: 5\' 2"  (1.575 m)    Body mass index is 30.36 kg/m.    General: well developed, well nourished elderly African-American lady, seated, in no evident distress Head: head normocephalic and atraumatic.   Neck: supple with no carotid or supraclavicular bruits Cardiovascular: regular rate and rhythm, no murmurs Musculoskeletal: no deformity.  Mild tenderness of posterior cervical and right suprascapular muscles slight restriction of movements. Skin:  no rash/petichiae Vascular:  Normal pulses all extremities  Neurologic Exam Mental Status: Awake and fully alert.  Fluent speech and language.  Oriented to place and time. Recent memory subjectively impaired and remote memory intact. Attention span, concentration and fund of knowledge appropriate during visit.  Provided extensive history and lots of information during visit without  difficulty.  Mood  and affect flat    05/15/2022    1:20 PM 09/18/2021    7:46 AM 04/03/2020    1:22 PM  MMSE - Mini Mental State Exam  Orientation to time 4 5 5   Orientation to Place 5 5 5   Registration 2 3 3   Attention/ Calculation 5 5 3   Recall 3 3 2   Language- name 2 objects 2 2 2   Language- repeat 1 1 1   Language- follow 3 step command 3 3 3   Language- read & follow direction 1 1 1   Write a sentence 1 1 1   Copy design 0 1 1  Total score 27 30 27    Cranial Nerves: Pupils equal, briskly reactive to light. Extraocular movements full without nystagmus. Visual fields full to confrontation.  Diminished vision acuity bilaterally (chronic).  Hearing slightly diminished bilaterally. Facial sensation intact. Face, tongue, palate moves normally and symmetrically.  Motor: Normal bulk and tone. Normal strength in all tested extremity muscles. Sensory.: intact to touch , pinprick , position and vibratory sensation.   Coordination: Rapid alternating movements normal in all extremities. Finger-to-nose and heel-to-shin performed accurately bilaterally. Gait and Station: Arises from chair without difficulty. Stance is normal. Gait demonstrates normal stride length and mild imbalance without use of assistive device. Able to heel, toe and tandem walk with great difficulty.  Reflexes: 1+ and symmetric. Toes downgoing.       IMAGING/LABS  MR brain w wo 04/2020 -chronic left thalamic lacunar infarct -negative for acute findings MR cervical 04/2020 - stable, mild disc bulging at C3-4 down to C6-7 EEG 04/2020  unremarkable Lab work 04/2020 WNL (sed rate, dementia panel, lipid panel and A1c)      05/15/2022    1:20 PM 09/18/2021    7:46 AM 04/03/2020    1:22 PM  MMSE - Mini Mental State Exam  Orientation to time 4 5 5   Orientation to Place 5 5 5   Registration 2 3 3   Attention/ Calculation 5 5 3   Recall 3 3 2   Language- name 2 objects 2 2 2   Language- repeat 1 1 1   Language- follow 3 step command 3 3 3   Language-  read & follow direction 1 1 1   Write a sentence 1 1 1   Copy design 0 1 1  Total score 27 30 27      ASSESSMENT/PLAN: 77 year old African-American lady with asymptomatic left subcortical lacunar infarct in August 2020 discovered at the time of brain imaging for vision difficulties which is unrelated and likely from glaucoma and eye problem.  Vascular risk factors of hypertension, hyperlipidemia, diabetes and sleep apnea with CPAP noncompliance.  At 03/2020 visit, new complaints of worsening balance, right hand numbness, memory loss, headaches -extensive evaluation unremarkable but continued to experience frontal and left orbital headaches.   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  He also has mild memory and cognitive difficulties due to mild cognitive impairment.  New complaints of daily headache and dizziness following syncopal event with postconcussive symptoms 1 week ago.    I had a long discussion with the patient and her daughter regarding her recent episode of syncope with concussion and persistent symptoms of daily headaches and dizziness which likely a postconcussive symptoms.  I recommend further evaluation with checking EEG for seizure activity.  Continue Tylenol 3 but limit to not more than 2 doses a day to avoid analgesic rebound.  I recommend she do regular neck stretching exercises  and use local heat application to help with her shoulder and neck pain.  I have advised her not to drive for 6 months as per [facility].  I recommended trial of Keppra at last visit but patient only took it for a few months and decided to stop it.  Will hold off for now unless she has more seizure-like episodes.  Continue memory compensation strategies and participation in cognitively challenging activities like solving crossword puzzles, playing bridge, word searches and sudoku.  Continue aspirin for stroke prevention and  maintain aggressive risk factor modification.  I advised her to use a cane at all times and avoid sudden and quick neck movements.  Return for follow-up in the future in 3 months with nurse practitioner or call earlier if necessary. I spent a prolonged 40 minutes of face-to-face and non-face-to-face time with patient and family discussing syncope and postconcussive headache and dizziness and cognitive impairment.  This included previsit chart review, lab review, study review, order entry, electronic health record documentation, patient education and discussion regarding memory complaints and likely underlying contributing factors, continued headaches and imbalance and answered all other questions to patient's satisfaction  Delia Heady, MD [facility] [address] [place], [place] [address]  Phone [phone] Fax [phone] Note: This document was prepared with digital dictation and possible smart phrase technology. Any transcriptional errors that result from this process are unintentional.

## 2022-12-10 ENCOUNTER — Telehealth: Payer: Self-pay | Admitting: *Deleted

## 2022-12-10 NOTE — Telephone Encounter (Signed)
Transition Care Management Unsuccessful Follow-up Telephone Call  Date of discharge and from where:  [facility] [place]  11/07/2022  Attempts:  1st Attempt  Reason for unsuccessful TCM follow-up call:  No answer/busy

## 2022-12-11 ENCOUNTER — Telehealth: Payer: Self-pay | Admitting: *Deleted

## 2022-12-11 DIAGNOSIS — H3581 Retinal edema: Secondary | ICD-10-CM | POA: Diagnosis not present

## 2022-12-11 DIAGNOSIS — H2013 Chronic iridocyclitis, bilateral: Secondary | ICD-10-CM | POA: Diagnosis not present

## 2022-12-11 DIAGNOSIS — H15012 Anterior scleritis, left eye: Secondary | ICD-10-CM | POA: Diagnosis not present

## 2022-12-11 NOTE — Telephone Encounter (Signed)
Transition Care Management Unsuccessful Follow-up Telephone Call  Date of discharge and from where:  [facility] 11/07/2022  Attempts:  2nd Attempt  Reason for unsuccessful TCM follow-up call:  No answer/busy

## 2022-12-12 ENCOUNTER — Ambulatory Visit (INDEPENDENT_AMBULATORY_CARE_PROVIDER_SITE_OTHER): Payer: Medicare Other | Admitting: Emergency Medicine

## 2022-12-12 ENCOUNTER — Encounter: Payer: Self-pay | Admitting: Emergency Medicine

## 2022-12-12 VITALS — BP 128/74 | HR 73 | Ht 62.0 in | Wt 165.8 lb

## 2022-12-12 DIAGNOSIS — J984 Other disorders of lung: Secondary | ICD-10-CM

## 2022-12-12 NOTE — Assessment & Plan Note (Signed)
Right upper lobe pulmonary nodule with some area of cavitation, more prominent on the most recent CT chest from her hospitalization.  Discussed the differential diagnosis with her which includes sarcoid, opportunistic infection, malignancy.  Also discussed the possible need to consider bronchoscopy depending on how this changes on serial imaging.  Her next scan is due in December.  We will review together and decide whether bronchoscopy is warranted to evaluate further  Right Upper Lobe Nodule Speculated incavitary nodule in the right upper lobe, slightly larger than on prior CT chest. Differential diagnosis includes infection, sarcoidosis, or slow-growing lung cancer. -Repeat CT chest in December 2024 to assess for changes in the nodule.  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 Family history of sarcoidosis. -Continue azathioprine as prescribed.

## 2022-12-12 NOTE — Progress Notes (Signed)
Subjective:    Patient ID: Sheila Reynolds, female    DOB: 01/14/1946, 77 y.o.   MRN: [id number]  HPI The patient is a 77 year old former smoker with a history of COPD, sarcoidosis, diabetes, hypertension, hyperlipidemia, and obstructive sleep apnea (OSA) on CPAP. She was recently hospitalized following an episode of syncope and head trauma, during which no seizure activity was observed. A urinary tract infection was identified and treated with Augmentin during the hospitalization. A CT pulmonary angiogram (CTPA) performed during the hospital stay revealed a speculated incavitary nodule in the right upper lobe, measuring 1.4 by 2.4 cm, which was slightly larger than on a previous CT chest. The patient has been under the care of Dr. Maple Hudson for her sarcoidosis.  The patient's sarcoidosis was diagnosed based on ocular manifestations and a family history of the condition, although no lung tests were performed. She has been on various medications for the condition, including methotrexate and steroids, and is currently on azathioprine, which was started less than a year ago. The patient also has a history of arthritis.  The patient experienced a blackout and head trauma, which did not break the skin. A neurologist diagnosed her with a concussion. She has been experiencing numbness on one side of her face and inside her mouth for the past two to three months, which has been evaluated by a dentist with no clear cause identified. The patient's daughter also attended the consultation.   Review of Systems As per HPI  Past Medical History:  Diagnosis Date   Anemia    3 months ago anemic   Anxiety    on meds   Arthritis    "all over" (01/15/2017)   Asthma    Bronchitis with emphysema    Chest pain    Chronic bronchitis (HCC)    Coronary artery disease    a. 01/2017 she underwent orbital atherectomy/DES to the proxmal LAD and PTCA to ostial D2. 2D Echo 01/15/17 showed mild LVH, EF 60-65%, grade 1 DD.    Family history of anesthesia complication    daughter N/V   Fibromyalgia    GERD (gastroesophageal reflux disease)    on meds   Glaucoma    Heart murmur    History of hiatal hernia    Hx of echocardiogram    Echo (03/2013):  Tech limited; Mild focal basal septal hypertrophy, EF 60-65%, normal RVF   Hyperlipidemia    Hypertension    Nonsustained ventricular tachycardia (HCC)    OSA on CPAP    Premature atrial contractions    PVC (premature ventricular contraction)    a. Holter 12/16: NSR, occ PAC,PVCs   Sarcoidosis    Type II diabetes mellitus (HCC)      Family History  Problem Relation Age of Onset   Heart disease Father    Diabetes Father    Glaucoma Father    Cancer Mother        unknown type, ?lung   Heart disease Paternal Grandmother    Cancer Paternal Grandmother        unknown type   Diabetes Brother    Glaucoma Brother      Social History   Socioeconomic History   Marital status: Married    Spouse name: Thayer Ohm   Number of children: 2   Years of education: Not on file   Highest education level: Not on file  Occupational History   Occupation: unemployed    Employer: [employer]  Tobacco Use   Smoking  status: Former    Current packs/day: 0.00    Average packs/day: 1 pack/day for 4.0 years (4.0 ttl pk-yrs)    Types: Cigarettes    Start date: 02/11/1976    Quit date: 02/11/1980    Years since quitting: 42.8   Smokeless tobacco: Never  Vaping Use   Vaping status: Never Used  Substance and Sexual Activity   Alcohol use: No   Drug use: No   Sexual activity: Yes  Other Topics Concern   Not on file  Social History Narrative   Lives with husband   Right hand   Drinks 2-3 cups caffeine daily   Social Determinants of Health   Financial Resource Strain: Low Risk  (04/30/2022)   Overall Financial Resource Strain (CARDIA)    Difficulty of Paying Living Expenses: Not hard at all  Food Insecurity: No Food Insecurity (12/04/2022)   Hunger  Vital Sign    Worried About Running Out of Food in the Last Year: Never true    Ran Out of Food in the Last Year: Never true  Transportation Needs: No Transportation Needs (12/04/2022)   PRAPARE - Administrator, Civil Service (Medical): No    Lack of Transportation (Non-Medical): No  Physical Activity: Inactive (04/30/2022)   Exercise Vital Sign    Days of Exercise per Week: 0 days    Minutes of Exercise per Session: 0 min  Stress: Stress Concern Present (04/30/2022)   [employer] of Occupational Health - Occupational Stress Questionnaire    Feeling of Stress : To some extent  Social Connections: Unknown (11/04/2021)   Received from [facility]   Social Network    Social Network: Not on file  Intimate Partner Violence: Not At Risk (12/04/2022)   Humiliation, Afraid, Rape, and Kick questionnaire    Fear of Current or Ex-Partner: No    Emotionally Abused: No    Physically Abused: No    Sexually Abused: No     Allergies  Allergen Reactions   Crestor [Rosuvastatin Calcium] Other (See Comments)    muscle aches   Demerol  [Meperidine Hcl]     Other reaction(s): Hallucinations   Shellfish Allergy Itching and Other (See Comments)    Crab, shrimp and lobster ---lips itch and tingle Was told not to eat again after having a allergy test. Lobster, crab and shrimp      Outpatient Medications Prior to Visit  Medication Sig Dispense Refill   acetaminophen (TYLENOL) 500 MG tablet Take 1,000 mg by mouth 2 (two) times daily as needed for moderate pain or headache.     albuterol (PROVENTIL) (2.5 MG/3ML) 0.083% nebulizer solution Take 3 mLs (2.5 mg total) by nebulization daily as needed for wheezing or shortness of breath. 75 mL 12   albuterol (VENTOLIN HFA) 108 (90 Base) MCG/ACT inhaler Inhale 2 puffs into the lungs every 6 (six) hours as needed for wheezing or shortness of breath. 18 g 12   amoxicillin-clavulanate (AUGMENTIN) 875-125 MG tablet Take 1 tablet by  mouth 2 (two) times daily for 7 days. 14 tablet 0   ANORO ELLIPTA 62.5-25 MCG/ACT AEPB INHALE 1 PUFF INTO THE LUNGS DAILY AT 6 AM 60 each 3   aspirin EC 81 MG tablet Take 81 mg by mouth daily. Swallow whole.     atorvastatin (LIPITOR) 80 MG tablet Take 1 tablet (80 mg total) by mouth daily. 90 tablet 3   Blood Glucose Monitoring Suppl (ACCU-CHEK AVIVA PLUS) w/Device KIT Use to check blood sugars  3 times a day. Dx code e11.65 1 kit 3   brimonidine (ALPHAGAN P) 0.1 % SOLN Place 1 drop into both eyes in the morning, at noon, and at bedtime.     brinzolamide (AZOPT) 1 % ophthalmic suspension Place 1 drop into both eyes 3 (three) times daily.     carvedilol (COREG) 25 MG tablet Take 1 tablet (25 mg total) by mouth 2 (two) times daily. 180 tablet 3   clopidogrel (PLAVIX) 75 MG tablet TAKE 1 TABLET(75 MG) BY MOUTH DAILY 90 tablet 2   EPINEPHrine 0.3 mg/0.3 mL IJ SOAJ injection Inject 0.3 mg into the muscle as needed for anaphylaxis. 1 each 0   ezetimibe (ZETIA) 10 MG tablet Take 1 tablet (10 mg total) by mouth daily. 90 tablet 3   furosemide (LASIX) 40 MG tablet Take 1 tablet (40 mg total) by mouth daily as needed. 90 tablet 1   isosorbide mononitrate (IMDUR) 60 MG 24 hr tablet TAKE 1 TABLET(60 MG) BY MOUTH DAILY 90 tablet 2   JANUMET 50-500 MG tablet TAKE 1 TABLET BY MOUTH TWICE DAILY WITH A MEAL 90 tablet 1   ketorolac (ACULAR) 0.5 % ophthalmic solution Place 1 drop into the right eye 3 (three) times daily.     Lancets (ONETOUCH DELICA PLUS LANCET33G) MISC CHECK BLOOD SUGAR BEFORE BREAKFAST AND DINNER 100 each 1   loperamide (IMODIUM) 2 MG capsule Take 1 capsule (2 mg total) by mouth 4 (four) times daily as needed for diarrhea or loose stools. 12 capsule 0   montelukast (SINGULAIR) 10 MG tablet TAKE 1 TABLET BY MOUTH EVERY DAY 90 tablet 1   Nebulizers (COMPRESSOR/NEBULIZER) MISC Use as directed 1 each 0   nitroGLYCERIN (NITROSTAT) 0.4 MG SL tablet Place 1 tablet (0.4 mg total) under the tongue every 5  (five) minutes as needed for chest pain. 25 tablet prn   ondansetron (ZOFRAN-ODT) 4 MG disintegrating tablet Take 1 tablet (4 mg total) by mouth every 8 (eight) hours as needed for nausea or vomiting. 20 tablet 0   oxyCODONE-acetaminophen (PERCOCET/ROXICET) 5-325 MG tablet Take 1 tablet by mouth every 6 (six) hours as needed for up to 7 days for moderate pain (pain score 4-6). 28 tablet 0   pantoprazole (PROTONIX) 40 MG tablet Take 1 tablet (40 mg total) by mouth daily. 90 tablet 2   Polyvinyl Alcohol-Povidone PF (REFRESH) 1.4-0.6 % SOLN Place 1-2 drops into both eyes 3 (three) times daily as needed (dry/irritated eyes.).     potassium chloride SA (KLOR-CON) 20 MEQ tablet Take 1 tablet (20 mEq total) by mouth every other day. 90 tablet 1   prednisoLONE acetate (PRED FORTE) 1 % ophthalmic suspension Place 2-3 drops into both eyes in the morning, at noon, and at bedtime. Place 2 Drops in Left Eye, Place 3 Drops In Right Eye TID     pregabalin (LYRICA) 75 MG capsule TAKE 1 CAPSULE(75 MG) BY MOUTH TWICE DAILY 180 capsule 1   RHOPRESSA 0.02 % SOLN Place 1 drop into the left eye at bedtime.     tiZANidine (ZANAFLEX) 4 MG tablet TAKE 1 TABLET(4 MG) BY MOUTH DAILY (Patient taking differently: Take 4 mg by mouth every 6 (six) hours as needed for muscle spasms.) 30 tablet 1   cetirizine (ZYRTEC ALLERGY) 10 MG tablet Take 1 tablet (10 mg total) by mouth daily. 30 tablet 2   No facility-administered medications prior to visit.        Objective:   Physical Exam Vitals:   12/12/22  0858  BP: 128/74  Pulse: 73  SpO2: 99%  Weight: 165 lb 12.8 oz (75.2 kg)  Height: 5\' 2"  (1.575 m)   Gen: Pleasant, well-nourished, in no distress,  normal affect  ENT: No lesions,  mouth clear,  oropharynx clear, no postnasal drip  Neck: No JVD, no stridor  Lungs: No use of accessory muscles, distant, no crackles or wheezing on normal respiration, no wheeze on forced expiration  Cardiovascular: RRR, heart sounds  normal, no murmur or gallops, no peripheral edema  Musculoskeletal: No deformities, no cyanosis or clubbing  Neuro: alert, awake, non focal  Skin: Warm, no lesions or rash      Assessment & Plan:  Cavitating mass in right upper lung lobe Right upper lobe pulmonary nodule with some area of cavitation, more prominent on the most recent CT chest from her hospitalization.  Discussed the differential diagnosis with her which includes sarcoid, opportunistic infection, malignancy.  Also discussed the possible need to consider bronchoscopy depending on how this changes on serial imaging.  Her next scan is due in December.  We will review together and decide whether bronchoscopy is warranted to evaluate further  Right Upper Lobe Nodule Speculated incavitary nodule in the right upper lobe, slightly larger than on prior CT chest. Differential diagnosis includes infection, sarcoidosis, or slow-growing lung cancer. -Repeat CT chest in December 2024 to assess for changes in the nodule.  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 Family history of sarcoidosis. -Continue azathioprine as prescribed.   Levy Pupa, MD, PhD 12/12/2022, 9:40 AM [facility] [phone] or if no answer before 7:00PM call [phone] For any issues after 7:00PM please call eLink [phone] The next 1 is hospital follow-up visit in

## 2022-12-12 NOTE — Patient Instructions (Signed)
Please get your CT scan of the chest in December as planned Continue your current medications Follow Dr. Delton Coombes in December after your CT chest so we can review and decide next steps in your evaluation

## 2022-12-13 ENCOUNTER — Encounter: Payer: Self-pay | Admitting: Internal Medicine

## 2022-12-13 NOTE — Assessment & Plan Note (Addendum)
Favor inflammatory Plan- Update CT in December. Willing to consider bronchoscopy if appropriate in future if necessary esp for culture.

## 2022-12-13 NOTE — Assessment & Plan Note (Signed)
Benefits when used. Forgetting to put it on Plan - encourage memory tricks including leaving herself notes and leaving machine ready to put on.

## 2022-12-16 ENCOUNTER — Ambulatory Visit: Payer: Medicare Other | Admitting: Internal Medicine

## 2022-12-16 ENCOUNTER — Encounter: Payer: Self-pay | Admitting: Internal Medicine

## 2022-12-16 VITALS — BP 100/70 | HR 74 | Temp 97.9°F | Ht 62.0 in | Wt 160.4 lb

## 2022-12-16 DIAGNOSIS — F0781 Postconcussional syndrome: Secondary | ICD-10-CM

## 2022-12-16 DIAGNOSIS — J984 Other disorders of lung: Secondary | ICD-10-CM | POA: Diagnosis not present

## 2022-12-16 DIAGNOSIS — I110 Hypertensive heart disease with heart failure: Secondary | ICD-10-CM | POA: Diagnosis not present

## 2022-12-16 DIAGNOSIS — I700 Atherosclerosis of aorta: Secondary | ICD-10-CM

## 2022-12-16 DIAGNOSIS — E785 Hyperlipidemia, unspecified: Secondary | ICD-10-CM | POA: Diagnosis not present

## 2022-12-16 DIAGNOSIS — I5032 Chronic diastolic (congestive) heart failure: Secondary | ICD-10-CM | POA: Diagnosis not present

## 2022-12-16 DIAGNOSIS — F321 Major depressive disorder, single episode, moderate: Secondary | ICD-10-CM | POA: Diagnosis not present

## 2022-12-16 DIAGNOSIS — E1169 Type 2 diabetes mellitus with other specified complication: Secondary | ICD-10-CM | POA: Diagnosis not present

## 2022-12-16 DIAGNOSIS — R55 Syncope and collapse: Secondary | ICD-10-CM

## 2022-12-16 NOTE — Progress Notes (Signed)
I,[name], CMA,acting as a Neurosurgeon for Sheila Aliment, MD.,have documented all relevant documentation on the behalf of Sheila Aliment, MD,as directed by  Sheila Aliment, MD while in the presence of Sheila Aliment, MD.  Subjective:  Patient ID: Sheila Reynolds , female    DOB: [birth year] , 77 y.o.   MRN: [id number]  Chief Complaint  Patient presents with   Hospitalization Follow-up    HPI  Patient presents today for hospital follow up. She was admitted to [facility] on 12/02/22 for evaluation of syncope.  She was discharged on 12/06/22.  She is accompanied by her husband today.     As stated above, she presented to [facility] ER on 12/02/22 after having a syncopal episode while eating dinner.  She was eating dinner when she got lightheaded and then suddenly passed out.  She could not remember if she was seated or standing at the time --- she occasionally eats while standing.  She also believes she struck the back of her head on the chair because her head was sore.  She does not know how long she was unconscious. Her husband was not home, she heard him come into the garage and yelled for him.  She denies having any urinary incontinence during this episode. She has chronic right hip pain that is slightly worse and mid back pain.  In ED, vital signs were stable, UA positive for UTI.  Hemoglobin 11.6, D-dimer elevated at 4.25.  CTA chest showed no PE; however, showed spiculated and cavitary nodule in the right upper lung that appears to be slowly growing since the prior studies.  Today, she reports feeling weak but her strength is coming back slowly but surely.        Past Medical History:  Diagnosis Date   Anemia    3 months ago anemic   Anxiety    on meds   Arthritis    "all over" (01/15/2017)   Asthma    Bronchitis with emphysema    Chest pain    Chronic bronchitis (HCC)    Coronary artery disease    a. 01/2017 she underwent orbital atherectomy/DES to the proxmal  LAD and PTCA to ostial D2. 2D Echo 01/15/17 showed mild LVH, EF 60-65%, grade 1 DD.   Family history of anesthesia complication    daughter N/V   Fibromyalgia    GERD (gastroesophageal reflux disease)    on meds   Glaucoma    Heart murmur    History of hiatal hernia    Hx of echocardiogram    Echo (03/2013):  Tech limited; Mild focal basal septal hypertrophy, EF 60-65%, normal RVF   Hyperlipidemia    Hypertension    Nonsustained ventricular tachycardia (HCC)    OSA on CPAP    Premature atrial contractions    PVC (premature ventricular contraction)    a. Holter 12/16: NSR, occ PAC,PVCs   Sarcoidosis    Type II diabetes mellitus (HCC)      Family History  Problem Relation Age of Onset   Heart disease Father    Diabetes Father    Glaucoma Father    Cancer Mother        unknown type, ?lung   Heart disease Paternal Grandmother    Cancer Paternal Grandmother        unknown type   Diabetes Brother    Glaucoma Brother      Current Outpatient Medications:    acetaminophen (TYLENOL) 500 MG tablet,  Take 1,000 mg by mouth 2 (two) times daily as needed for moderate pain or headache., Disp: , Rfl:    albuterol (PROVENTIL) (2.5 MG/3ML) 0.083% nebulizer solution, Take 3 mLs (2.5 mg total) by nebulization daily as needed for wheezing or shortness of breath., Disp: 75 mL, Rfl: 12   albuterol (VENTOLIN HFA) 108 (90 Base) MCG/ACT inhaler, Inhale 2 puffs into the lungs every 6 (six) hours as needed for wheezing or shortness of breath., Disp: 18 g, Rfl: 12   ANORO ELLIPTA 62.5-25 MCG/ACT AEPB, INHALE 1 PUFF INTO THE LUNGS DAILY AT 6 AM, Disp: 60 each, Rfl: 3   aspirin EC 81 MG tablet, Take 81 mg by mouth daily. Swallow whole., Disp: , Rfl:    atorvastatin (LIPITOR) 80 MG tablet, Take 1 tablet (80 mg total) by mouth daily., Disp: 90 tablet, Rfl: 3   Blood Glucose Monitoring Suppl (ACCU-CHEK AVIVA PLUS) w/Device KIT, Use to check blood sugars 3 times a day. Dx code e11.65, Disp: 1 kit, Rfl: 3    brimonidine (ALPHAGAN P) 0.1 % SOLN, Place 1 drop into both eyes in the morning, at noon, and at bedtime., Disp: , Rfl:    brinzolamide (AZOPT) 1 % ophthalmic suspension, Place 1 drop into both eyes 3 (three) times daily., Disp: , Rfl:    carvedilol (COREG) 25 MG tablet, Take 1 tablet (25 mg total) by mouth 2 (two) times daily., Disp: 180 tablet, Rfl: 3   clopidogrel (PLAVIX) 75 MG tablet, TAKE 1 TABLET(75 MG) BY MOUTH DAILY, Disp: 90 tablet, Rfl: 2   EPINEPHrine 0.3 mg/0.3 mL IJ SOAJ injection, Inject 0.3 mg into the muscle as needed for anaphylaxis., Disp: 1 each, Rfl: 0   furosemide (LASIX) 40 MG tablet, Take 1 tablet (40 mg total) by mouth daily as needed., Disp: 90 tablet, Rfl: 1   isosorbide mononitrate (IMDUR) 60 MG 24 hr tablet, TAKE 1 TABLET(60 MG) BY MOUTH DAILY, Disp: 90 tablet, Rfl: 2   JANUMET 50-500 MG tablet, TAKE 1 TABLET BY MOUTH TWICE DAILY WITH A MEAL, Disp: 90 tablet, Rfl: 1   ketorolac (ACULAR) 0.5 % ophthalmic solution, Place 1 drop into the right eye 3 (three) times daily., Disp: , Rfl:    Lancets (ONETOUCH DELICA PLUS LANCET33G) MISC, CHECK BLOOD SUGAR BEFORE BREAKFAST AND DINNER, Disp: 100 each, Rfl: 1   loperamide (IMODIUM) 2 MG capsule, Take 1 capsule (2 mg total) by mouth 4 (four) times daily as needed for diarrhea or loose stools. (Patient not taking: Reported on 01/01/2023), Disp: 12 capsule, Rfl: 0   montelukast (SINGULAIR) 10 MG tablet, TAKE 1 TABLET BY MOUTH EVERY DAY, Disp: 90 tablet, Rfl: 1   Nebulizers (COMPRESSOR/NEBULIZER) MISC, Use as directed, Disp: 1 each, Rfl: 0   nitroGLYCERIN (NITROSTAT) 0.4 MG SL tablet, Place 1 tablet (0.4 mg total) under the tongue every 5 (five) minutes as needed for chest pain., Disp: 25 tablet, Rfl: prn   ondansetron (ZOFRAN-ODT) 4 MG disintegrating tablet, Take 1 tablet (4 mg total) by mouth every 8 (eight) hours as needed for nausea or vomiting. (Patient not taking: Reported on 01/01/2023), Disp: 20 tablet, Rfl: 0   pantoprazole  (PROTONIX) 40 MG tablet, Take 1 tablet (40 mg total) by mouth daily., Disp: 90 tablet, Rfl: 2   Polyvinyl Alcohol-Povidone PF (REFRESH) 1.4-0.6 % SOLN, Place 1-2 drops into both eyes 3 (three) times daily as needed (dry/irritated eyes.)., Disp: , Rfl:    potassium chloride SA (KLOR-CON) 20 MEQ tablet, Take 1 tablet (20 mEq total)  by mouth every other day., Disp: 90 tablet, Rfl: 1   prednisoLONE acetate (PRED FORTE) 1 % ophthalmic suspension, Place 2-3 drops into both eyes in the morning, at noon, and at bedtime. Place 2 Drops in Left Eye, Place 3 Drops In Right Eye TID, Disp: , Rfl:    pregabalin (LYRICA) 75 MG capsule, TAKE 1 CAPSULE(75 MG) BY MOUTH TWICE DAILY, Disp: 180 capsule, Rfl: 1   RHOPRESSA 0.02 % SOLN, Place 1 drop into the left eye at bedtime., Disp: , Rfl:    cetirizine (ZYRTEC ALLERGY) 10 MG tablet, Take 1 tablet (10 mg total) by mouth daily., Disp: 30 tablet, Rfl: 2   ezetimibe (ZETIA) 10 MG tablet, Take 1 tablet (10 mg total) by mouth daily., Disp: 90 tablet, Rfl: 1   ibuprofen (ADVIL) 600 MG tablet, Take 1 tablet (600 mg total) by mouth every 6 (six) hours as needed., Disp: 30 tablet, Rfl: 0   tiZANidine (ZANAFLEX) 4 MG tablet, Take 0.5 tablets (2 mg total) by mouth every 6 (six) hours as needed for muscle spasms., Disp: 15 tablet, Rfl: 0   Allergies  Allergen Reactions   Crestor [Rosuvastatin Calcium] Other (See Comments)    muscle aches   Demerol  [Meperidine Hcl]     Other reaction(s): Hallucinations   Shellfish Allergy Itching and Other (See Comments)    Crab, shrimp and lobster ---lips itch and tingle Was told not to eat again after having a allergy test. Lobster, crab and shrimp      Review of Systems  Constitutional: Negative.   Respiratory: Negative.    Cardiovascular: Negative.   Gastrointestinal: Negative.   Neurological: Negative.   Psychiatric/Behavioral: Negative.       Today's Vitals   12/16/22 1148  BP: 100/70  Pulse: 74  Temp: 97.9 F (36.6 C)   SpO2: 98%  Weight: 160 lb 6.4 oz (72.8 kg)  Height: 5\' 2"  (1.575 m)   Body mass index is 29.34 kg/m.  Wt Readings from Last 3 Encounters:  12/16/22 160 lb 6.4 oz (72.8 kg)  12/12/22 165 lb 12.8 oz (75.2 kg)  12/09/22 166 lb (75.3 kg)     Objective:  Physical Exam Vitals and nursing note reviewed.  Constitutional:      Appearance: Normal appearance.  HENT:     Head: Normocephalic and atraumatic.  Eyes:     Extraocular Movements: Extraocular movements intact.  Cardiovascular:     Rate and Rhythm: Normal rate and regular rhythm.     Heart sounds: Normal heart sounds.  Pulmonary:     Effort: Pulmonary effort is normal.     Breath sounds: Normal breath sounds.  Musculoskeletal:     Cervical back: Normal range of motion.  Skin:    General: Skin is warm.  Neurological:     General: No focal deficit present.     Mental Status: She is alert.  Psychiatric:        Mood and Affect: Mood normal.        Behavior: Behavior normal.         Assessment And Plan:  Syncope, unspecified syncope type Assessment & Plan: TCM PERFORMED. A MEMBER OF THE CLINICAL TEAM SPOKE WITH THE PATIENT UPON DISCHARGE. DISCHARGE SUMMARY WAS REVIEWED IN FULL DETAIL DURING THE VISIT. MEDS RECONCILED AND COMPARED TO DISCHARGE MEDS. MEDICATION LIST WAS UPDATED AND REVIEWED WITH THE PATIENT. GREATER THAN 50% FACE TO FACE TIME WAS SPENT IN COUNSELING AND COORDINATION OF CARE. ALL QUESTIONS WERE ANSWERED TO THE SATISFACTION OF  THE PATIENT. She is encouraged to stay well hydrated. She is already closely followed by Cardiology.  She is to closely monitor for repeat episodes. She is also encouraged to eat several small meals throughout the day to decrease risk of hypoglycemia.     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  She will have repeat CT in Dec 2024 for f/u. At that time, a bronchoscopy may be ordered. She does have h/o of sarcoidosis, this is part of  differential.    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 heart failure (HCC) Assessment & Plan: Chronic, well controlled. Importance of medication compliance was discussed with the patient. Encouraged to follow low sodium diet. No med changes today.    Atherosclerosis of aorta (HCC) Assessment & Plan: Chronic, LDL goal < 70.  She will continue with atorvastatin 80mg  and Zetia 10mg  daily. She is encouraged to follow a heart healthy lifestyle.    Current moderate episode of major depressive disorder, unspecified whether recurrent [facility]) Assessment & Plan: Sx are currently stable. Last PHQ-9 performed in August 2024.    Postconcussion syndrome Assessment & Plan: Pt advised to stay well hydrated and to take Tylenol prn.    Dyslipidemia associated with type 2 diabetes mellitus (HCC) Assessment & Plan: Chronic, she is currently on Janumet. Importance of dietary and medication compliance was discussed with the patient.   Orders: -     Hemoglobin A1c   TCM code not billed since she had ED visit post discharge within 30 days.    Return for move Dec appt to March - dm check, thanks.  Patient was given opportunity to ask questions. Patient verbalized understanding of the plan and was able to repeat key elements of the plan. All questions were answered to their satisfaction.    I, Sheila Aliment, MD, have reviewed all documentation for this visit. The documentation on 12/16/22 for the exam, diagnosis, procedures, and orders are all accurate and complete.   IF YOU HAVE BEEN REFERRED TO A SPECIALIST, IT MAY TAKE 1-2 WEEKS TO SCHEDULE/PROCESS THE REFERRAL. IF YOU HAVE NOT HEARD FROM US/SPECIALIST IN TWO WEEKS, PLEASE GIVE [place] A CALL AT [phone] X [phone].   THE PATIENT IS ENCOURAGED TO PRACTICE SOCIAL DISTANCING DUE TO THE COVID-19 PANDEMIC.

## 2022-12-16 NOTE — Patient Instructions (Signed)

## 2022-12-17 ENCOUNTER — Telehealth: Payer: Self-pay | Admitting: Cardiovascular Disease

## 2022-12-17 LAB — HEMOGLOBIN A1C
Est. average glucose Bld gHb Est-mCnc: 128 mg/dL
Hgb A1c MFr Bld: 6.1 % — ABNORMAL HIGH (ref 4.8–5.6)

## 2022-12-17 NOTE — Telephone Encounter (Signed)
Patient would to switch to since Dr. Elease Hashimoto is slowing down with seeing patient. Please advise

## 2022-12-18 ENCOUNTER — Inpatient Hospital Stay: Payer: Medicare Other | Admitting: Internal Medicine

## 2023-01-01 ENCOUNTER — Ambulatory Visit
Admission: EM | Admit: 2023-01-01 | Discharge: 2023-01-01 | Disposition: A | Discharge status: Home / Self Care | Payer: Medicare Other | Attending: Family Medicine | Admitting: Family Medicine

## 2023-01-01 ENCOUNTER — Other Ambulatory Visit: Payer: Self-pay

## 2023-01-01 ENCOUNTER — Encounter: Payer: Self-pay | Admitting: Emergency Medicine

## 2023-01-01 DIAGNOSIS — M26621 Arthralgia of right temporomandibular joint: Secondary | ICD-10-CM

## 2023-01-01 MED ORDER — TIZANIDINE HCL 4 MG PO TABS: 2.0000 mg | ORAL_TABLET | Freq: Four times a day (QID) | ORAL | 0 refills | Status: DC | PRN

## 2023-01-01 MED ORDER — IBUPROFEN 600 MG PO TABS: 600.0000 mg | ORAL_TABLET | Freq: Four times a day (QID) | ORAL | 0 refills | Status: DC | PRN

## 2023-01-01 MED ORDER — EZETIMIBE 10 MG PO TABS: 10.0000 mg | ORAL_TABLET | Freq: Every day | ORAL | 1 refills | Status: DC

## 2023-01-01 NOTE — ED Provider Notes (Signed)
[facility]    CSN: [id number] Arrival date & time: 01/01/23  1140      History   Chief Complaint Chief Complaint  Patient presents with   Facial Pain    HPI Sheila Reynolds is a 77 y.o. female.   Patient is here for pain down the right side of the head, to the ear, and down into the jaw area.  Has been present x 2 days.  It woke her from sleep.   The pain is not constant, but shoots up.  No blurry vision, but it is blurred a lot of times anyway.  Tylenol does ease the pain, but does not stop it.  No sinus congestion or drainage.   She does have pain in her mouth at times, but no known teeth issues.  No fevers/chills.  She states this has happened before, but no known cause.  She did see ent.  It just went away on its own.         Past Medical History:  Diagnosis Date   Anemia    3 months ago anemic   Anxiety    on meds   Arthritis    "all over" (01/15/2017)   Asthma    Bronchitis with emphysema    Chest pain    Chronic bronchitis (HCC)    Coronary artery disease    a. 01/2017 she underwent orbital atherectomy/DES to the proxmal LAD and PTCA to ostial D2. 2D Echo 01/15/17 showed mild LVH, EF 60-65%, grade 1 DD.   Family history of anesthesia complication    daughter N/V   Fibromyalgia    GERD (gastroesophageal reflux disease)    on meds   Glaucoma    Heart murmur    History of hiatal hernia    Hx of echocardiogram    Echo (03/2013):  Tech limited; Mild focal basal septal hypertrophy, EF 60-65%, normal RVF   Hyperlipidemia    Hypertension    Nonsustained ventricular tachycardia (HCC)    OSA on CPAP    Premature atrial contractions    PVC (premature ventricular contraction)    a. Holter 12/16: NSR, occ PAC,PVCs   Sarcoidosis    Type II diabetes mellitus (HCC)     Patient Active Problem List   Diagnosis Date Noted   Postconcussion syndrome 12/16/2022   Syncope 12/03/2022   Acute lower UTI 12/03/2022   Cavitating mass in right upper  lung lobe 12/03/2022   Dyslipidemia 12/03/2022   Coronary artery disease 12/03/2022   Chronic obstructive pulmonary disease (COPD) (HCC) 12/03/2022   Cavitary pneumonia 12/03/2022   Atherosclerosis of aorta (HCC) 10/05/2022   Left leg pain 10/05/2022   Pain of toe of left foot 10/05/2022   Current moderate episode of major depressive disorder, unspecified whether recurrent (HCC) 09/22/2022   Abdominal hernia 05/15/2022   Candidal vulvovaginitis 05/15/2022   Fracture of foot 05/15/2022   Hearing loss 05/15/2022   Hypertensive heart disease without heart failure 05/15/2022   Menopausal syndrome 05/15/2022   Other long term (current) drug therapy 05/15/2022   Postherpetic trigeminal neuralgia 05/15/2022   Swollen abdomen 05/15/2022   Type 2 diabetes mellitus with hyperglycemia (HCC) 05/15/2022   Urinary incontinence 05/15/2022   Partial symptomatic epilepsy with complex partial seizures, not intractable, without status epilepticus (HCC) 05/15/2022   Diabetic polyneuropathy associated with type 2 diabetes mellitus (HCC) 12/15/2021   Oxygen dependent 12/15/2021   History of fall 12/15/2021   Chronic arthritis 12/15/2021   Dyslipidemia associated with type 2  diabetes mellitus (HCC) 11/05/2021   Generalized weakness 11/05/2021   Pneumonitis 11/05/2021   Postnasal drip 10/18/2021   Low hemoglobin 10/18/2021   Positive ANA (antinuclear antibody) 10/18/2021   BMI 29.0-29.9,adult 10/18/2021   IPF (idiopathic pulmonary fibrosis) (HCC) 08/20/2021   Urinary frequency 06/19/2021   Urinary tract infection without hematuria 06/19/2021   Imbalance 06/19/2021   Memory changes 06/19/2021   Drug therapy 06/19/2021   Back pain 05/24/2020   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 heart failure (HCC) 04/16/2020   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) (HCC) 04/13/2020   Chronic diastolic heart failure (HCC) 03/28/2020   Uncontrolled type 2 diabetes  mellitus with hyperglycemia (HCC) 07/25/2019   Hiatal hernia 07/25/2019   Spontaneous ecchymoses 07/25/2019   Class 1 obesity due to excess calories with body mass index (BMI) of 31.0 to 31.9 in adult 07/25/2019   Peripheral vascular disease, unspecified (HCC) 03/23/2019   Uveitis 06/01/2017   Fibromyalgia 06/01/2017   DDD (degenerative disc disease), lumbar 06/01/2017   DDD (degenerative disc disease), thoracic 06/01/2017   History of hypercholesterolemia 06/01/2017   History of anxiety 06/01/2017   History of IBS 06/01/2017   Premature atrial contractions 03/30/2017   PVC's (premature ventricular contractions) 03/30/2017   Diabetes mellitus without complication (HCC) 01/28/2017   Angina pectoris (HCC) 01/15/2017   CAD (coronary artery disease) 01/12/2017   Shortness of breath 12/12/2016   Nuclear sclerotic cataract of left eye 11/16/2015   Status post eye surgery 11/16/2015   Primary open angle glaucoma of left eye, moderate stage 10/31/2015   Chronic iritis of both eyes 08/22/2015   Cystoid macular edema of right eye 08/22/2015   Acute maxillary sinusitis 05/03/2014   Obstructive sleep apnea 11/04/2013   Multiple lung nodules 07/18/2012   Heart palpitations 03/11/2012   HTN (hypertension) 03/11/2012   Unspecified glaucoma 08/29/2011   Gastroesophageal reflux disease without esophagitis 07/13/2011   Sarcoidosis 02/08/2011   COPD with acute exacerbation (HCC) 02/05/2011   Hyperlipemia 09/24/2010   Atypical chest pain 09/24/2010   Leg edema 09/24/2010    Past Surgical History:  Procedure Laterality Date   BREAST SURGERY     CARDIAC CATHETERIZATION  05/04/2007   reveals overall normal left ventricular systolic function. Ejection fraction 65-70%   CATARACT EXTRACTION W/PHACO Right 07/20/2013   Procedure: CATARACT EXTRACTION PHACO AND INTRAOCULAR LENS PLACEMENT (IOC);  Surgeon: Chalmers Guest, MD;  Location: [facility] OR;  Service: Ophthalmology;  Laterality: Right;   COLONOSCOPY W/  BIOPSIES AND POLYPECTOMY     COLONOSCOPY WITH PROPOFOL N/A 09/07/2014   Procedure: COLONOSCOPY WITH PROPOFOL;  Surgeon: Charna Elizabeth, MD;  Location: [facility];  Service: Endoscopy;  Laterality: N/A;   COLONOSCOPY WITH PROPOFOL N/A 01/10/2020   Procedure: COLONOSCOPY WITH PROPOFOL;  Surgeon: Charna Elizabeth, MD;  Location: [facility];  Service: Endoscopy;  Laterality: N/A;   CORONARY ANGIOPLASTY WITH STENT PLACEMENT  01/15/2017   CORONARY ATHERECTOMY N/A 01/15/2017   Procedure: CORONARY ATHERECTOMY;  Surgeon: Yvonne Kendall, MD;  Location: [facility];  Service: Cardiovascular;  Laterality: N/A;   CORONARY BALLOON ANGIOPLASTY N/A 01/15/2017   Procedure: CORONARY BALLOON ANGIOPLASTY;  Surgeon: Yvonne Kendall, MD;  Location: [facility];  Service: Cardiovascular;  Laterality: N/A;   CORONARY PRESSURE/FFR STUDY N/A 12/12/2016   Procedure: INTRAVASCULAR PRESSURE WIRE/FFR STUDY;  Surgeon: Yvonne Kendall, MD;  Location: [facility];  Service: Cardiovascular;  Laterality: N/A;   CORONARY STENT INTERVENTION N/A 01/15/2017   Procedure: CORONARY STENT INTERVENTION;  Surgeon:  End, Cristal Deer, MD;  Location: [facility];  Service: Cardiovascular;  Laterality: N/A;   ESOPHAGOGASTRODUODENOSCOPY (EGD) WITH PROPOFOL N/A 09/07/2014   Procedure: ESOPHAGOGASTRODUODENOSCOPY (EGD) WITH PROPOFOL;  Surgeon: Charna Elizabeth, MD;  Location: [facility];  Service: Endoscopy;  Laterality: N/A;   EXTERNAL EAR SURGERY Bilateral 1970s   tumors removed   EYE SURGERY Left 2019   cataract extraction    EYE SURGERY Right 02/09/2018   eyelid surgery    MINI SHUNT INSERTION Right 07/20/2013   Procedure: INSERTION OF GLAUCOMA FILTRATION DEVICE RIGHT EYE;  Surgeon: Chalmers Guest, MD;  Location[facility] OR;  Service: Ophthalmology;  Laterality: Right;   MITOMYCIN C APPLICATION Right 07/20/2013   Procedure: MITOMYCIN C APPLICATION;  Surgeon: Chalmers Guest, MD;  Location: [facility] OR;  Service: Ophthalmology;   Laterality: Right;   MITOMYCIN C APPLICATION Right 02/21/2015   Procedure: MITOMYCIN C APPLICATION RIGHT EYE;  Surgeon: Chalmers Guest, MD;  Location: [facility] OR;  Service: Ophthalmology;  Laterality: Right;   PLACEMENT OF BREAST IMPLANTS Bilateral 1992   "took all my breast tissue out; put implants in;fibrocystic breast disease "   POLYPECTOMY  01/10/2020   Procedure: POLYPECTOMY;  Surgeon: Charna Elizabeth, MD;  Location: [facility];  Service: Endoscopy;;   RIGHT/LEFT HEART CATH AND CORONARY ANGIOGRAPHY N/A 12/12/2016   Procedure: RIGHT/LEFT HEART CATH AND CORONARY ANGIOGRAPHY;  Surgeon: Yvonne Kendall, MD;  Location: [facility];  Service: Cardiovascular;  Laterality: N/A;   TONSILLECTOMY     TOTAL ABDOMINAL HYSTERECTOMY  1982   TRABECULECTOMY Right 02/21/2015   Procedure: TRABECULECTOMY WITH [facility] ON THE RIGHT EYE;  Surgeon: Chalmers Guest, MD;  Location: [facility] OR;  Service: Ophthalmology;  Laterality: Right;    OB History     Gravida  2   Para  2   Term      Preterm      AB      Living  2      SAB      IAB      Ectopic      Multiple      Live Births               Home Medications    Prior to Admission medications   Medication Sig Start Date End Date Taking? Authorizing Provider  acetaminophen (TYLENOL) 500 MG tablet Take 1,000 mg by mouth 2 (two) times daily as needed for moderate pain or headache.   Yes [provider]  albuterol (PROVENTIL) (2.5 MG/3ML) 0.083% nebulizer solution Take 3 mLs (2.5 mg total) by nebulization daily as needed for wheezing or shortness of breath. 04/01/21  Yes Arnette Felts, FNP  albuterol (VENTOLIN HFA) 108 (90 Base) MCG/ACT inhaler Inhale 2 puffs into the lungs every 6 (six) hours as needed for wheezing or shortness of breath. 12/30/19  Yes Young, Clinton D, MD  ANORO ELLIPTA 62.5-25 MCG/ACT AEPB INHALE 1 PUFF INTO THE LUNGS DAILY AT 6 AM 02/21/22  Yes [name], Praveen, MD  aspirin EC 81 MG tablet Take 81 mg by mouth daily. Swallow  whole.   Yes [provider]  atorvastatin (LIPITOR) 80 MG tablet Take 1 tablet (80 mg total) by mouth daily. 08/23/21  Yes [name], Deloris Ping, MD  Blood Glucose Monitoring Suppl (ACCU-CHEK AVIVA PLUS) w/Device KIT Use to check blood sugars 3 times a day. Dx code e11.65 01/03/20  Yes Dorothyann Peng, MD  brimonidine (ALPHAGAN P) 0.1 % SOLN Place 1 drop into both eyes in the morning, at noon, and  at bedtime.   Yes [provider]  brinzolamide (AZOPT) 1 % ophthalmic suspension Place 1 drop into both eyes 3 (three) times daily.   Yes [provider]  carvedilol (COREG) 25 MG tablet Take 1 tablet (25 mg total) by mouth 2 (two) times daily. 01/01/22  Yes Corky Crafts, MD  clopidogrel (PLAVIX) 75 MG tablet TAKE 1 TABLET(75 MG) BY MOUTH DAILY 10/09/22  Yes [name], Deloris Ping, MD  ezetimibe (ZETIA) 10 MG tablet Take 1 tablet (10 mg total) by mouth daily. 01/01/23  Yes [name], Deloris Ping, MD  furosemide (LASIX) 40 MG tablet Take 1 tablet (40 mg total) by mouth daily as needed. 03/28/22  Yes [name], Deloris Ping, MD  isosorbide mononitrate (IMDUR) 60 MG 24 hr tablet TAKE 1 TABLET(60 MG) BY MOUTH DAILY 09/16/22  Yes [name], Deloris Ping, MD  JANUMET 50-500 MG tablet TAKE 1 TABLET BY MOUTH TWICE DAILY WITH A MEAL 10/10/22  Yes Dorothyann Peng, MD  ketorolac (ACULAR) 0.5 % ophthalmic solution Place 1 drop into the right eye 3 (three) times daily. 08/02/20  Yes [provider]  Lancets (ONETOUCH DELICA PLUS LANCET33G) MISC CHECK BLOOD SUGAR BEFORE BREAKFAST AND DINNER 09/12/20  Yes Ghumman, Ramandeep, NP  montelukast (SINGULAIR) 10 MG tablet TAKE 1 TABLET BY MOUTH EVERY DAY 02/17/22  Yes Dorothyann Peng, MD  Nebulizers (COMPRESSOR/NEBULIZER) MISC Use as directed 04/22/18  Yes Young, Joni Fears D, MD  pantoprazole (PROTONIX) 40 MG tablet Take 1 tablet (40 mg total) by mouth daily. 09/19/22  Yes [name], Deloris Ping, MD  Polyvinyl Alcohol-Povidone PF (REFRESH) 1.4-0.6 % SOLN Place 1-2 drops into both eyes 3  (three) times daily as needed (dry/irritated eyes.).   Yes [provider]  potassium chloride SA (KLOR-CON) 20 MEQ tablet Take 1 tablet (20 mEq total) by mouth every other day. 06/16/19  Yes [name], Deloris Ping, MD  prednisoLONE acetate (PRED FORTE) 1 % ophthalmic suspension Place 2-3 drops into both eyes in the morning, at noon, and at bedtime. Place 2 Drops in Left Eye, Place 3 Drops In Right Eye TID 10/11/20  Yes [provider]  pregabalin (LYRICA) 75 MG capsule TAKE 1 CAPSULE(75 MG) BY MOUTH TWICE DAILY 08/05/22  Yes Dorothyann Peng, MD  RHOPRESSA 0.02 % SOLN Place 1 drop into the left eye at bedtime. 05/27/21  Yes [provider]  cetirizine (ZYRTEC ALLERGY) 10 MG tablet Take 1 tablet (10 mg total) by mouth daily. 10/18/21 12/03/22  Dorothyann Peng, MD  EPINEPHrine 0.3 mg/0.3 mL IJ SOAJ injection Inject 0.3 mg into the muscle as needed for anaphylaxis. 06/05/22   Dorothyann Peng, MD  loperamide (IMODIUM) 2 MG capsule Take 1 capsule (2 mg total) by mouth 4 (four) times daily as needed for diarrhea or loose stools. Patient not taking: Reported on 01/01/2023 11/07/22   Sabas Sous, MD  nitroGLYCERIN (NITROSTAT) 0.4 MG SL tablet Place 1 tablet (0.4 mg total) under the tongue every 5 (five) minutes as needed for chest pain. 07/25/19 09/21/23  Dorothyann Peng, MD  ondansetron (ZOFRAN-ODT) 4 MG disintegrating tablet Take 1 tablet (4 mg total) by mouth every 8 (eight) hours as needed for nausea or vomiting. Patient not taking: Reported on 01/01/2023 12/06/22   Cathren Harsh, MD  tiZANidine (ZANAFLEX) 4 MG tablet TAKE 1 TABLET(4 MG) BY MOUTH DAILY Patient not taking: Reported on 01/01/2023 05/05/22   Dorothyann Peng, MD    Family History Family History  Problem Relation Age of Onset   Heart disease Father  Diabetes Father    Glaucoma Father    Cancer Mother        unknown type, ?lung   Heart disease Paternal Grandmother    Cancer Paternal Grandmother        unknown type    Diabetes Brother    Glaucoma Brother     Social History Social History   Tobacco Use   Smoking status: Former    Current packs/day: 0.00    Average packs/day: 1 pack/day for 4.0 years (4.0 ttl pk-yrs)    Types: Cigarettes    Start date: 02/11/1976    Quit date: 02/11/1980    Years since quitting: 42.9   Smokeless tobacco: Never  Vaping Use   Vaping status: Never Used  Substance Use Topics   Alcohol use: No   Drug use: No     Allergies   Crestor [rosuvastatin calcium], Demerol  [meperidine hcl], and Shellfish allergy   Review of Systems Review of Systems  Constitutional: Negative.   HENT:  Positive for ear pain.   Respiratory: Negative.    Cardiovascular: Negative.   Gastrointestinal: Negative.   Genitourinary: Negative.   Musculoskeletal: Negative.      Physical Exam Triage Vital Signs ED Triage Vitals  Encounter Vitals Group     BP 01/01/23 1204 (!) 151/79     Systolic BP Percentile --      Diastolic BP Percentile --      Pulse Rate 01/01/23 1204 62     Resp 01/01/23 1204 18     Temp 01/01/23 1204 (!) 97.4 F (36.3 C)     Temp Source 01/01/23 1204 Oral     SpO2 01/01/23 1204 97 %     Weight --      Height --      Head Circumference --      Peak Flow --      Pain Score 01/01/23 1205 8     Pain Loc --      Pain Education --      Exclude from Growth Chart --    No data found.  Updated Vital Signs BP (!) 151/79 (BP Location: Left Arm)   Pulse 62   Temp (!) 97.4 F (36.3 C) (Oral)   Resp 18   SpO2 97%   Visual Acuity Right Eye Distance:   Left Eye Distance:   Bilateral Distance:    Right Eye Near:   Left Eye Near:    Bilateral Near:     Physical Exam Constitutional:      Appearance: Normal appearance.  HENT:     Nose: Nose normal.     Mouth/Throat:     Mouth: Mucous membranes are moist.     Pharynx: No oropharyngeal exudate or posterior oropharyngeal erythema.  Eyes:     Extraocular Movements: Extraocular movements intact.     Pupils:  Pupils are equal, round, and reactive to light.  Cardiovascular:     Rate and Rhythm: Normal rate and regular rhythm.  Pulmonary:     Effort: Pulmonary effort is normal.     Breath sounds: Normal breath sounds.  Musculoskeletal:     Cervical back: Normal range of motion and neck supple. No tenderness.     Comments: She has TTP to the right TMJ;  no ttp to the temporal area;  TTP extends to the lower jaw as well;  full rom of the mouth/jaw without limitation;   Skin:    General: Skin is warm and dry.  Neurological:  General: No focal deficit present.     Mental Status: She is alert.  Psychiatric:        Mood and Affect: Mood normal.      UC Treatments / Results  Labs (all labs ordered are listed, but only abnormal results are displayed) Labs Reviewed - No data to display  EKG   Radiology No results found.  Procedures Procedures (including critical care time)  Medications Ordered in UC Medications - No data to display  Initial Impression / Assessment and Plan / UC Course  I have reviewed the triage vital signs and the nursing notes.  Pertinent labs & imaging results that were available during my care of the patient were reviewed by me and considered in my medical decision making (see chart for details).  Final Clinical Impressions(s) / UC Diagnoses   Final diagnoses:  Arthralgia of right temporomandibular joint     Discharge Instructions      You were seen today for pain at the right TMJ.  I have given you information on this today.  I have sent out a low dose muscle relaxer for pain, as well as motrin.  I recommend you use ice for the area as well.  If you have worsening pain despite medication then please go to the ER for evaluation.  Otherwise, follow up with your primary care provider.     ED Prescriptions     Medication Sig Dispense Auth. Provider   tiZANidine (ZANAFLEX) 4 MG tablet Take 0.5 tablets (2 mg total) by mouth every 6 (six) hours as needed  for muscle spasms. 15 tablet Kijana Cromie, MD   ibuprofen (ADVIL) 600 MG tablet Take 1 tablet (600 mg total) by mouth every 6 (six) hours as needed. 30 tablet Jannifer Franklin, MD      PDMP not reviewed this encounter.   Jannifer Franklin, MD 01/01/23 1236

## 2023-01-01 NOTE — ED Triage Notes (Signed)
Pt reports R-sided facial pain x2 days. Constant pain with occasional shooting pain. Aggravated by chewing food and palpation. Pt reports fall x3 weeks ago and associated hospitalization.

## 2023-01-01 NOTE — Discharge Instructions (Addendum)
You were seen today for pain at the right TMJ.  I have given you information on this today.  I have sent out a low dose muscle relaxer for pain, as well as motrin.  I recommend you use ice for the area as well.  If you have worsening pain despite medication then please go to the ER for evaluation.  Otherwise, follow up with your primary care provider.

## 2023-01-02 ENCOUNTER — Other Ambulatory Visit (HOSPITAL_BASED_OUTPATIENT_CLINIC_OR_DEPARTMENT_OTHER): Payer: Self-pay

## 2023-01-02 DIAGNOSIS — J984 Other disorders of lung: Secondary | ICD-10-CM

## 2023-01-02 NOTE — Telephone Encounter (Signed)
Pt calling back in for an update

## 2023-01-04 NOTE — Assessment & Plan Note (Signed)
Chronic, LDL goal < 70.  She will continue with atorvastatin 80mg  and Zetia 10mg  daily. She is encouraged to follow a heart healthy lifestyle.

## 2023-01-04 NOTE — Assessment & Plan Note (Signed)
Chronic, she is currently on Janumet. Importance of dietary and medication compliance was discussed with the patient.

## 2023-01-04 NOTE — Assessment & Plan Note (Signed)
Chronic, well controlled. Importance of medication compliance was discussed with the patient. Encouraged to follow low sodium diet. No med changes today.

## 2023-01-04 NOTE — Assessment & Plan Note (Signed)
Pt advised to stay well hydrated and to take Tylenol prn.

## 2023-01-04 NOTE — Assessment & Plan Note (Signed)
TCM PERFORMED. A MEMBER OF THE CLINICAL TEAM SPOKE WITH THE PATIENT UPON DISCHARGE. DISCHARGE SUMMARY WAS REVIEWED IN FULL DETAIL DURING THE VISIT. MEDS RECONCILED AND COMPARED TO DISCHARGE MEDS. MEDICATION LIST WAS UPDATED AND REVIEWED WITH THE PATIENT. GREATER THAN 50% FACE TO FACE TIME WAS SPENT IN COUNSELING AND COORDINATION OF CARE. ALL QUESTIONS WERE ANSWERED TO THE SATISFACTION OF THE PATIENT. She is encouraged to stay well hydrated. She is already closely followed by Cardiology.  She is to closely monitor for repeat episodes. She is also encouraged to eat several small meals throughout the day to decrease risk of hypoglycemia.

## 2023-01-04 NOTE — Assessment & Plan Note (Addendum)
Sx are currently stable. Last PHQ-9 performed in August 2024.

## 2023-01-04 NOTE — Assessment & Plan Note (Signed)
She has been evaluated by Pulmonary.  She will have repeat CT in Dec 2024 for f/u. At that time, a bronchoscopy may be ordered. She does have h/o of sarcoidosis, this is part of differential.

## 2023-01-05 ENCOUNTER — Ambulatory Visit (HOSPITAL_BASED_OUTPATIENT_CLINIC_OR_DEPARTMENT_OTHER): Payer: Medicare Other | Admitting: Emergency Medicine

## 2023-01-05 ENCOUNTER — Telehealth: Payer: Self-pay

## 2023-01-05 DIAGNOSIS — J984 Other disorders of lung: Secondary | ICD-10-CM

## 2023-01-05 LAB — PULMONARY FUNCTION TEST
DL/VA % pred: 100 %
DL/VA: 4.2 ml/min/mmHg/L
DLCO cor % pred: 60 %
DLCO cor: 10.72 ml/min/mmHg
DLCO unc % pred: 57 %
DLCO unc: 10.23 ml/min/mmHg
FEF 25-75 Post: 1.2 L/s
FEF 25-75 Pre: 1.15 L/s
FEF2575-%Change-Post: 4 %
FEF2575-%Pred-Post: 77 %
FEF2575-%Pred-Pre: 74 %
FEV1-%Change-Post: 0 %
FEV1-%Pred-Post: 62 %
FEV1-%Pred-Pre: 62 %
FEV1-Post: 1.21 L
FEV1-Pre: 1.2 L
FEV1FVC-%Change-Post: -2 %
FEV1FVC-%Pred-Pre: 108 %
FEV6-%Change-Post: 3 %
FEV6-%Pred-Post: 62 %
FEV6-%Pred-Pre: 60 %
FEV6-Post: 1.53 L
FEV6-Pre: 1.48 L
FEV6FVC-%Pred-Post: 105 %
FEV6FVC-%Pred-Pre: 105 %
FVC-%Change-Post: 3 %
FVC-%Pred-Post: 59 %
FVC-%Pred-Pre: 57 %
FVC-Post: 1.53 L
FVC-Pre: 1.49 L
Post FEV1/FVC ratio: 79 %
Post FEV6/FVC ratio: 100 %
Pre FEV1/FVC ratio: 81 %
Pre FEV6/FVC Ratio: 100 %
RV % pred: 57 %
RV: 1.25 L
TLC % pred: 60 %
TLC: 2.89 L

## 2023-01-05 NOTE — Transitions of Care (Post Inpatient/ED Visit) (Signed)
   01/05/2023  Name: Sheila Reynolds MRN: [id number] DOB: Jan 08, 1946  Today's TOC FU Call Status: Today's TOC FU Call Status:: Successful TOC FU Call Completed TOC FU Call Complete Date: 01/05/23 Patient's Name and Date of Birth confirmed.  Transition Care Management Follow-up Telephone Call Date of Discharge: 01/01/23 Discharge Facility: [facility]) Type of Discharge: Emergency Department Reason for ED Visit: Other: How have you been since you were released from the hospital?: Worse Any questions or concerns?: No  Items Reviewed: Did you receive and understand the discharge instructions provided?: Yes Medications obtained,verified, and reconciled?: Yes (Medications Reviewed) Any new allergies since your discharge?: No Dietary orders reviewed?: NA Do you have support at home?: Yes People in Home: spouse Name of Support/Comfort Primary Source: Thayer Ohm Geraci  Medications Reviewed Today: Medications Reviewed Today   Medications were not reviewed in this encounter     Home Care and Equipment/Supplies: Were Home Health Services Ordered?: NA Any new equipment or medical supplies ordered?: NA  Functional Questionnaire: Do you need assistance with bathing/showering or dressing?: No Do you need assistance with meal preparation?: No Do you need assistance with eating?: No Do you have difficulty maintaining continence: No Do you need assistance with getting out of bed/getting out of a chair/moving?: No Do you have difficulty managing or taking your medications?: No  Follow up appointments reviewed: PCP Follow-up appointment confirmed?: NA Specialist Hospital Follow-up appointment confirmed?: NA (appt not scheduled yet) Do you need transportation to your follow-up appointment?: No Do you understand care options if your condition(s) worsen?: Yes-patient verbalized understanding    SIGNATUREYL,RMA

## 2023-01-05 NOTE — Patient Instructions (Signed)
Full PFT Performed Today  

## 2023-01-05 NOTE — Progress Notes (Signed)
Full PFT Performed Today  

## 2023-01-06 DIAGNOSIS — Z79899 Other long term (current) drug therapy: Secondary | ICD-10-CM | POA: Diagnosis not present

## 2023-01-06 DIAGNOSIS — M2550 Pain in unspecified joint: Secondary | ICD-10-CM | POA: Diagnosis not present

## 2023-01-06 DIAGNOSIS — D8686 Sarcoid arthropathy: Secondary | ICD-10-CM | POA: Diagnosis not present

## 2023-01-06 DIAGNOSIS — M7989 Other specified soft tissue disorders: Secondary | ICD-10-CM | POA: Diagnosis not present

## 2023-01-06 DIAGNOSIS — M0609 Rheumatoid arthritis without rheumatoid factor, multiple sites: Secondary | ICD-10-CM | POA: Diagnosis not present

## 2023-01-06 DIAGNOSIS — M1990 Unspecified osteoarthritis, unspecified site: Secondary | ICD-10-CM | POA: Diagnosis not present

## 2023-01-06 DIAGNOSIS — J8410 Pulmonary fibrosis, unspecified: Secondary | ICD-10-CM | POA: Diagnosis not present

## 2023-01-06 DIAGNOSIS — D869 Sarcoidosis, unspecified: Secondary | ICD-10-CM | POA: Diagnosis not present

## 2023-01-06 DIAGNOSIS — R768 Other specified abnormal immunological findings in serum: Secondary | ICD-10-CM | POA: Diagnosis not present

## 2023-01-06 DIAGNOSIS — I890 Lymphedema, not elsewhere classified: Secondary | ICD-10-CM | POA: Diagnosis not present

## 2023-01-07 NOTE — Telephone Encounter (Signed)
Chilton Si, MD  to [facility] Gleason  Me     01/07/23 12:41 PM OK with me.  TCR

## 2023-01-15 ENCOUNTER — Ambulatory Visit: Payer: Medicare Other | Admitting: Internal Medicine

## 2023-01-20 ENCOUNTER — Ambulatory Visit (INDEPENDENT_AMBULATORY_CARE_PROVIDER_SITE_OTHER): Payer: Medicare Other | Admitting: Neurology

## 2023-01-20 DIAGNOSIS — R55 Syncope and collapse: Secondary | ICD-10-CM | POA: Diagnosis not present

## 2023-01-23 ENCOUNTER — Ambulatory Visit
Admission: RE | Admit: 2023-01-23 | Discharge: 2023-01-23 | Disposition: A | Discharge status: Home / Self Care | Payer: Medicare Other | Source: Ambulatory Visit | Attending: Internal Medicine | Admitting: Internal Medicine

## 2023-01-23 DIAGNOSIS — J8410 Pulmonary fibrosis, unspecified: Secondary | ICD-10-CM | POA: Diagnosis not present

## 2023-01-23 DIAGNOSIS — R911 Solitary pulmonary nodule: Secondary | ICD-10-CM | POA: Diagnosis not present

## 2023-01-23 DIAGNOSIS — R918 Other nonspecific abnormal finding of lung field: Secondary | ICD-10-CM

## 2023-01-23 DIAGNOSIS — I700 Atherosclerosis of aorta: Secondary | ICD-10-CM | POA: Diagnosis not present

## 2023-01-24 NOTE — Progress Notes (Signed)
Kindly inform the patient that EEG of brainwave study showed mild slowing of brain wave activity which is nothing to worry about and may be seen in a variety of conditions including old age, memory loss and previous stroke.  No definite seizure activity noted.

## 2023-01-27 ENCOUNTER — Telehealth: Payer: Self-pay

## 2023-01-27 NOTE — Telephone Encounter (Signed)
-----   Message from Delia Heady sent at 01/24/2023 11:51 AM EST ----- Kindly inform the patient that EEG of brainwave study showed mild slowing of brain wave activity which is nothing to worry about and may be seen in a variety of conditions including old age, memory loss and previous stroke.  No definite seizure activity noted.

## 2023-01-27 NOTE — Telephone Encounter (Signed)
Called pt and spoke with pt daughter and husband. I informed of Dr Pearlean Brownie results. Pt daughter and husband verbalized understanding. Pt dughter and husband had no questions at this time but was encouraged to call back if questions arise.

## 2023-01-27 NOTE — Telephone Encounter (Signed)
Called patient and Left a detail message per Dr Lance Muss" Patient that EEG of brainwave study showed mild slowing of brain wave activity which is nothing to worry about and may be seen in a variety of conditions including old age, memory loss and previous stroke.  No definite seizure activity noted."

## 2023-02-05 NOTE — Progress Notes (Signed)
Spoke with pt and notified of results per Dr. Young Pt verbalized understanding and denied any questions. 

## 2023-02-06 DIAGNOSIS — M2012 Hallux valgus (acquired), left foot: Secondary | ICD-10-CM | POA: Diagnosis not present

## 2023-02-06 DIAGNOSIS — E1151 Type 2 diabetes mellitus with diabetic peripheral angiopathy without gangrene: Secondary | ICD-10-CM | POA: Diagnosis not present

## 2023-02-06 DIAGNOSIS — R262 Difficulty in walking, not elsewhere classified: Secondary | ICD-10-CM | POA: Diagnosis not present

## 2023-02-06 DIAGNOSIS — M2011 Hallux valgus (acquired), right foot: Secondary | ICD-10-CM | POA: Diagnosis not present

## 2023-02-06 DIAGNOSIS — B351 Tinea unguium: Secondary | ICD-10-CM | POA: Diagnosis not present

## 2023-02-06 DIAGNOSIS — I739 Peripheral vascular disease, unspecified: Secondary | ICD-10-CM | POA: Diagnosis not present

## 2023-02-06 DIAGNOSIS — Z9181 History of falling: Secondary | ICD-10-CM | POA: Diagnosis not present

## 2023-02-06 DIAGNOSIS — S86112A Strain of other muscle(s) and tendon(s) of posterior muscle group at lower leg level, left leg, initial encounter: Secondary | ICD-10-CM | POA: Diagnosis not present

## 2023-02-06 DIAGNOSIS — L851 Acquired keratosis [keratoderma] palmaris et plantaris: Secondary | ICD-10-CM | POA: Diagnosis not present

## 2023-02-06 DIAGNOSIS — S92515A Nondisplaced fracture of proximal phalanx of left lesser toe(s), initial encounter for closed fracture: Secondary | ICD-10-CM | POA: Diagnosis not present

## 2023-02-09 ENCOUNTER — Ambulatory Visit
Admission: RE | Admit: 2023-02-09 | Discharge: 2023-02-09 | Disposition: A | Discharge status: Home / Self Care | Payer: Medicare Other | Source: Ambulatory Visit | Attending: Family Medicine | Admitting: Family Medicine

## 2023-02-09 VITALS — BP 149/77 | HR 81 | Temp 99.3°F | Resp 16

## 2023-02-09 DIAGNOSIS — N3001 Acute cystitis with hematuria: Secondary | ICD-10-CM | POA: Diagnosis not present

## 2023-02-09 DIAGNOSIS — R350 Frequency of micturition: Secondary | ICD-10-CM | POA: Diagnosis not present

## 2023-02-09 LAB — POCT URINALYSIS DIP (MANUAL ENTRY)
Bilirubin, UA: NEGATIVE
Glucose, UA: NEGATIVE mg/dL
Nitrite, UA: POSITIVE — AB
Protein Ur, POC: 100 mg/dL — AB
Spec Grav, UA: 1.025 (ref 1.010–1.025)
Urobilinogen, UA: 0.2 U/dL
pH, UA: 5.5 (ref 5.0–8.0)

## 2023-02-09 MED ORDER — CEPHALEXIN 500 MG PO CAPS: 500.0000 mg | ORAL_CAPSULE | Freq: Three times a day (TID) | ORAL | 0 refills | Status: DC

## 2023-02-09 NOTE — ED Triage Notes (Signed)
Pt c/o urinary freq, dysuria x 3 days-NAD-slow gait with own cane

## 2023-02-09 NOTE — ED Provider Notes (Signed)
[facility]  Note:  This document was prepared using Conservation officer, historic buildings and may include unintentional dictation errors.  MRN: [id number] DOB: May 30, 1945  Subjective:   Sheila Reynolds is a 77 y.o. female presenting for 3-day history of acute onset recurrent urinary frequency, dysuria.  Does not hydrate well with water.  Drinks a lot of soda, sweet tea, coffee.  Has a history of urinary tract infections.  No current facility-administered medications for this encounter.  Current Outpatient Medications:    acetaminophen (TYLENOL) 500 MG tablet, Take 1,000 mg by mouth 2 (two) times daily as needed for moderate pain or headache., Disp: , Rfl:    albuterol (PROVENTIL) (2.5 MG/3ML) 0.083% nebulizer solution, Take 3 mLs (2.5 mg total) by nebulization daily as needed for wheezing or shortness of breath., Disp: 75 mL, Rfl: 12   albuterol (VENTOLIN HFA) 108 (90 Base) MCG/ACT inhaler, Inhale 2 puffs into the lungs every 6 (six) hours as needed for wheezing or shortness of breath., Disp: 18 g, Rfl: 12   ANORO ELLIPTA 62.5-25 MCG/ACT AEPB, INHALE 1 PUFF INTO THE LUNGS DAILY AT 6 AM, Disp: 60 each, Rfl: 3   aspirin EC 81 MG tablet, Take 81 mg by mouth daily. Swallow whole., Disp: , Rfl:    atorvastatin (LIPITOR) 80 MG tablet, Take 1 tablet (80 mg total) by mouth daily., Disp: 90 tablet, Rfl: 3   Blood Glucose Monitoring Suppl (ACCU-CHEK AVIVA PLUS) w/Device KIT, Use to check blood sugars 3 times a day. Dx code e11.65, Disp: 1 kit, Rfl: 3   brimonidine (ALPHAGAN P) 0.1 % SOLN, Place 1 drop into both eyes in the morning, at noon, and at bedtime., Disp: , Rfl:    brinzolamide (AZOPT) 1 % ophthalmic suspension, Place 1 drop into both eyes 3 (three) times daily., Disp: , Rfl:    carvedilol (COREG) 25 MG tablet, Take 1 tablet (25 mg total) by mouth 2 (two) times daily., Disp: 180 tablet, Rfl: 3   cetirizine (ZYRTEC ALLERGY) 10 MG tablet, Take 1 tablet (10 mg total) by  mouth daily., Disp: 30 tablet, Rfl: 2   clopidogrel (PLAVIX) 75 MG tablet, TAKE 1 TABLET(75 MG) BY MOUTH DAILY, Disp: 90 tablet, Rfl: 2   EPINEPHrine 0.3 mg/0.3 mL IJ SOAJ injection, Inject 0.3 mg into the muscle as needed for anaphylaxis., Disp: 1 each, Rfl: 0   ezetimibe (ZETIA) 10 MG tablet, Take 1 tablet (10 mg total) by mouth daily., Disp: 90 tablet, Rfl: 1   furosemide (LASIX) 40 MG tablet, Take 1 tablet (40 mg total) by mouth daily as needed., Disp: 90 tablet, Rfl: 1   ibuprofen (ADVIL) 600 MG tablet, Take 1 tablet (600 mg total) by mouth every 6 (six) hours as needed., Disp: 30 tablet, Rfl: 0   isosorbide mononitrate (IMDUR) 60 MG 24 hr tablet, TAKE 1 TABLET(60 MG) BY MOUTH DAILY, Disp: 90 tablet, Rfl: 2   JANUMET 50-500 MG tablet, TAKE 1 TABLET BY MOUTH TWICE DAILY WITH A MEAL, Disp: 90 tablet, Rfl: 1   ketorolac (ACULAR) 0.5 % ophthalmic solution, Place 1 drop into the right eye 3 (three) times daily., Disp: , Rfl:    Lancets (ONETOUCH DELICA PLUS LANCET33G) MISC, CHECK BLOOD SUGAR BEFORE BREAKFAST AND DINNER, Disp: 100 each, Rfl: 1   loperamide (IMODIUM) 2 MG capsule, Take 1 capsule (2 mg total) by mouth 4 (four) times daily as needed for diarrhea or loose stools. (Patient not taking: Reported on 01/01/2023), Disp: 12 capsule, Rfl:  0   montelukast (SINGULAIR) 10 MG tablet, TAKE 1 TABLET BY MOUTH EVERY DAY, Disp: 90 tablet, Rfl: 1   Nebulizers (COMPRESSOR/NEBULIZER) MISC, Use as directed, Disp: 1 each, Rfl: 0   nitroGLYCERIN (NITROSTAT) 0.4 MG SL tablet, Place 1 tablet (0.4 mg total) under the tongue every 5 (five) minutes as needed for chest pain., Disp: 25 tablet, Rfl: prn   ondansetron (ZOFRAN-ODT) 4 MG disintegrating tablet, Take 1 tablet (4 mg total) by mouth every 8 (eight) hours as needed for nausea or vomiting. (Patient not taking: Reported on 01/01/2023), Disp: 20 tablet, Rfl: 0   pantoprazole (PROTONIX) 40 MG tablet, Take 1 tablet (40 mg total) by mouth daily., Disp: 90 tablet,  Rfl: 2   Polyvinyl Alcohol-Povidone PF (REFRESH) 1.4-0.6 % SOLN, Place 1-2 drops into both eyes 3 (three) times daily as needed (dry/irritated eyes.)., Disp: , Rfl:    potassium chloride SA (KLOR-CON) 20 MEQ tablet, Take 1 tablet (20 mEq total) by mouth every other day., Disp: 90 tablet, Rfl: 1   prednisoLONE acetate (PRED FORTE) 1 % ophthalmic suspension, Place 2-3 drops into both eyes in the morning, at noon, and at bedtime. Place 2 Drops in Left Eye, Place 3 Drops In Right Eye TID, Disp: , Rfl:    pregabalin (LYRICA) 75 MG capsule, TAKE 1 CAPSULE(75 MG) BY MOUTH TWICE DAILY, Disp: 180 capsule, Rfl: 1   RHOPRESSA 0.02 % SOLN, Place 1 drop into the left eye at bedtime., Disp: , Rfl:    tiZANidine (ZANAFLEX) 4 MG tablet, Take 0.5 tablets (2 mg total) by mouth every 6 (six) hours as needed for muscle spasms., Disp: 15 tablet, Rfl: 0   Allergies  Allergen Reactions   Crestor [Rosuvastatin Calcium] Other (See Comments)    muscle aches   Demerol  [Meperidine Hcl]     Other reaction(s): Hallucinations   Shellfish Allergy Itching and Other (See Comments)    Crab, shrimp and lobster ---lips itch and tingle Was told not to eat again after having a allergy test. Lobster, crab and shrimp     Past Medical History:  Diagnosis Date   Anemia    3 months ago anemic   Anxiety    on meds   Arthritis    "all over" (01/15/2017)   Asthma    Bronchitis with emphysema    Chest pain    Chronic bronchitis (HCC)    Coronary artery disease    a. 01/2017 she underwent orbital atherectomy/DES to the proxmal LAD and PTCA to ostial D2. 2D Echo 01/15/17 showed mild LVH, EF 60-65%, grade 1 DD.   Family history of anesthesia complication    daughter N/V   Fibromyalgia    GERD (gastroesophageal reflux disease)    on meds   Glaucoma    Heart murmur    History of hiatal hernia    Hx of echocardiogram    Echo (03/2013):  Tech limited; Mild focal basal septal hypertrophy, EF 60-65%, normal RVF   Hyperlipidemia     Hypertension    Nonsustained ventricular tachycardia (HCC)    OSA on CPAP    Premature atrial contractions    PVC (premature ventricular contraction)    a. Holter 12/16: NSR, occ PAC,PVCs   Sarcoidosis    Type II diabetes mellitus (HCC)      Past Surgical History:  Procedure Laterality Date   BREAST SURGERY     CARDIAC CATHETERIZATION  05/04/2007   reveals overall normal left ventricular systolic function. Ejection fraction 65-70%  CATARACT EXTRACTION W/PHACO Right 07/20/2013   Procedure: CATARACT EXTRACTION PHACO AND INTRAOCULAR LENS PLACEMENT (IOC);  Surgeon: Chalmers Guest, MD;  Location: [facility] OR;  Service: Ophthalmology;  Laterality: Right;   COLONOSCOPY W/ BIOPSIES AND POLYPECTOMY     COLONOSCOPY WITH PROPOFOL N/A 09/07/2014   Procedure: COLONOSCOPY WITH PROPOFOL;  Surgeon: Charna Elizabeth, MD;  Location: [facility];  Service: Endoscopy;  Laterality: N/A;   COLONOSCOPY WITH PROPOFOL N/A 01/10/2020   Procedure: COLONOSCOPY WITH PROPOFOL;  Surgeon: Charna Elizabeth, MD;  Location: [facility];  Service: Endoscopy;  Laterality: N/A;   CORONARY ANGIOPLASTY WITH STENT PLACEMENT  01/15/2017   CORONARY ATHERECTOMY N/A 01/15/2017   Procedure: CORONARY ATHERECTOMY;  Surgeon: Yvonne Kendall, MD;  Location: [facility];  Service: Cardiovascular;  Laterality: N/A;   CORONARY BALLOON ANGIOPLASTY N/A 01/15/2017   Procedure: CORONARY BALLOON ANGIOPLASTY;  Surgeon: Yvonne Kendall, MD;  Location: [facility];  Service: Cardiovascular;  Laterality: N/A;   CORONARY PRESSURE/FFR STUDY N/A 12/12/2016   Procedure: INTRAVASCULAR PRESSURE WIRE/FFR STUDY;  Surgeon: Yvonne Kendall, MD;  Location: [facility];  Service: Cardiovascular;  Laterality: N/A;   CORONARY STENT INTERVENTION N/A 01/15/2017   Procedure: CORONARY STENT INTERVENTION;  Surgeon: Yvonne Kendall, MD;  [facility]: [facility];  Service: Cardiovascular;  Laterality: N/A;   ESOPHAGOGASTRODUODENOSCOPY (EGD) WITH PROPOFOL  N/A 09/07/2014   Procedure: ESOPHAGOGASTRODUODENOSCOPY (EGD) WITH PROPOFOL;  Surgeon: Charna Elizabeth, MD;  Location: [facility];  Service: Endoscopy;  Laterality: N/A;   EXTERNAL EAR SURGERY Bilateral 1970s   tumors removed   EYE SURGERY Left 2019   cataract extraction    EYE SURGERY Right 02/09/2018   eyelid surgery    MINI SHUNT INSERTION Right 07/20/2013   Procedure: INSERTION OF GLAUCOMA FILTRATION DEVICE RIGHT EYE;  Surgeon: Chalmers Guest, MD;  Location: [facility] OR;  Service: Ophthalmology;  Laterality: Right;   MITOMYCIN C APPLICATION Right 07/20/2013   Procedure: MITOMYCIN C APPLICATION;  Surgeon: Chalmers Guest, MD;  Location: [facility] OR;  Service: Ophthalmology;  Laterality: Right;   MITOMYCIN C APPLICATION Right 02/21/2015   Procedure: MITOMYCIN C APPLICATION RIGHT EYE;  Surgeon: Chalmers Guest, MD;  Location: [facility] OR;  Service: Ophthalmology;  Laterality: Right;   PLACEMENT OF BREAST IMPLANTS Bilateral 1992   "took all my breast tissue out; put implants in;fibrocystic breast disease "   POLYPECTOMY  01/10/2020   Procedure: POLYPECTOMY;  Surgeon: Charna Elizabeth, MD;  Location: [facility];  Service: Endoscopy;;   RIGHT/LEFT HEART CATH AND CORONARY ANGIOGRAPHY N/A 12/12/2016   Procedure: RIGHT/LEFT HEART CATH AND CORONARY ANGIOGRAPHY;  Surgeon: Yvonne Kendall, MD;  [facility];  Service: Cardiovascular;  Laterality: N/A;   TONSILLECTOMY     TOTAL ABDOMINAL HYSTERECTOMY  1982   TRABECULECTOMY Right 02/21/2015   Procedure: TRABECULECTOMY WITH [facility] ON THE RIGHT EYE;  Surgeon: Chalmers Guest, MD;  Location: [facility] OR;  Service: Ophthalmology;  Laterality: Right;    Family History  Problem Relation Age of Onset   Heart disease Father    Diabetes Father    Glaucoma Father    Cancer Mother        unknown type, ?lung   Heart disease Paternal Grandmother    Cancer Paternal Grandmother        unknown type   Diabetes Brother    Glaucoma Brother     Social History   Tobacco Use   Smoking  status: Former    Current packs/day: 0.00    Average packs/day: 1 pack/day for  4.0 years (4.0 ttl pk-yrs)    Types: Cigarettes    Start date: 02/11/1976    Quit date: 02/11/1980    Years since quitting: 43.0   Smokeless tobacco: Never  Vaping Use   Vaping status: Never Used  Substance Use Topics   Alcohol use: No   Drug use: No    ROS   Objective:   Vitals: BP (!) 149/77 (BP Location: Right Arm)   Pulse 81   Temp 99.3 F (37.4 C) (Oral)   Resp 16   SpO2 96%   Physical Exam Constitutional:      General: She is not in acute distress.    Appearance: Normal appearance. She is well-developed. She is not ill-appearing, toxic-appearing or diaphoretic.  HENT:     Head: Normocephalic and atraumatic.     Nose: Nose normal.     Mouth/Throat:     Mouth: Mucous membranes are moist.     Pharynx: Oropharynx is clear.  Eyes:     General: No scleral icterus.       Right eye: No discharge.        Left eye: No discharge.     Extraocular Movements: Extraocular movements intact.     Conjunctiva/sclera: Conjunctivae normal.  Cardiovascular:     Rate and Rhythm: Normal rate.  Pulmonary:     Effort: Pulmonary effort is normal.  Abdominal:     General: Bowel sounds are normal. There is no distension.     Palpations: Abdomen is soft. There is no mass.     Tenderness: There is no abdominal tenderness. There is no right CVA tenderness, left CVA tenderness, guarding or rebound.  Skin:    General: Skin is warm and dry.  Neurological:     General: No focal deficit present.     Mental Status: She is alert and oriented to person, place, and time.  Psychiatric:        Mood and Affect: Mood normal.        Behavior: Behavior normal.        Thought Content: Thought content normal.        Judgment: Judgment normal.     Results for orders placed or performed during the hospital encounter of 02/09/23 (from the past 24 hours)  POCT urinalysis dipstick     Status: Abnormal   Collection Time:  02/09/23  5:24 PM  Result Value Ref Range   Color, UA yellow yellow   Clarity, UA cloudy (A) clear   Glucose, UA negative negative mg/dL   Bilirubin, UA negative negative   Ketones, POC UA small (15) (A) negative mg/dL   Spec Grav, UA 1.610 9.604 - 1.025   Blood, UA large (A) negative   pH, UA 5.5 5.0 - 8.0   Protein Ur, POC =100 (A) negative mg/dL   Urobilinogen, UA 0.2 0.2 or 1.0 E.U./dL   Nitrite, UA Positive (A) Negative   Leukocytes, UA Moderate (2+) (A) Negative   *Note: Due to a large number of results and/or encounters for the requested time period, some results have not been displayed. A complete set of results can be found in Results Review.    Assessment and Plan :   PDMP not reviewed this encounter.  1. Acute cystitis with hematuria   2. Urinary frequency    Start cephalexin to cover for acute cystitis, urine culture pending.  Recommended aggressive hydration, limiting urinary irritants. Counseled patient on potential for adverse effects with medications prescribed/recommended today, ER and return-to-clinic precautions  discussed, patient verbalized understanding.    Wallis Bamberg, [place] 02/09/23 0981

## 2023-02-09 NOTE — Discharge Instructions (Signed)
Please start cephalexin to address an urinary tract infection. Make sure you hydrate very well with plain water and a quantity of 64 ounces of water a day.  Please limit drinks that are considered urinary irritants such as soda, sweet tea, coffee, energy drinks, alcohol.  These can worsen your urinary and genital symptoms but also be the source of them.  I will let you know about your urine culture results through MyChart to see if we need to prescribe or change your antibiotics based off of those results.

## 2023-02-12 ENCOUNTER — Other Ambulatory Visit: Payer: Self-pay | Admitting: Internal Medicine

## 2023-02-12 LAB — URINE CULTURE: Culture: >=100000 — AB

## 2023-02-12 MED ORDER — PREGABALIN 75 MG PO CAPS: ORAL_CAPSULE | ORAL | 1 refills | Status: DC

## 2023-02-16 ENCOUNTER — Encounter: Payer: Self-pay | Admitting: Adult Health

## 2023-02-16 ENCOUNTER — Ambulatory Visit (INDEPENDENT_AMBULATORY_CARE_PROVIDER_SITE_OTHER): Payer: Medicare Other | Admitting: Adult Health

## 2023-02-16 ENCOUNTER — Other Ambulatory Visit: Payer: Self-pay | Admitting: Interventional Cardiology

## 2023-02-16 VITALS — BP 138/92 | HR 69 | Ht 62.0 in | Wt 165.0 lb

## 2023-02-16 DIAGNOSIS — R413 Other amnesia: Secondary | ICD-10-CM

## 2023-02-16 DIAGNOSIS — Z8673 Personal history of transient ischemic attack (TIA), and cerebral infarction without residual deficits: Secondary | ICD-10-CM | POA: Diagnosis not present

## 2023-02-16 NOTE — Patient Instructions (Addendum)
 Your Plan:  Will place a referral to neuropsychology for neurocognitive testing - you will be called to schedule   Ensure you are using your CPAP machine nightly as untreated apnea can contribute to memory difficulty   Ensure you are doing routine memory exercises, can look on your phone for different memory exercise apps   Routine physical activity and exercise, eating healthy and drinking adequate water will also help      Follow up in 6 months or call earlier if needed     Thank you for coming to see us  at [facility]. I hope we have been able to provide you high quality care today.  You may receive a patient satisfaction survey over the next few weeks. We would appreciate your feedback and comments so that we may continue to improve ourselves and the health of our patients.     Local Driver Evaluation Programs:   Comprehensive Evaluation: includes clinical and in vehicle behind the wheel testing by OCCUPATIONAL THERAPIST. Programs have varying levels of adaptive controls available for trial.   [employer], [place] [address] [place], [place]  [address] [phone] or [phone] [email or web address] Evaluator:  Cyndee Crompton, OT/CDRS/CDI/SCDCM/Low Vision Certification   [facility] [address] [place], [place] [address] [phone] [email or web address] Evaluators:  Clotilda Prescott, OT and Kate Dollar, OT   [facility] [place] (ONLY SERVES VETERANS!!) [facility] [address] [place], [place]  [address] [phone] [email or web address] Evaluators:  Camellia Fischer, KT; Ike Lesches, KT;  Arley Quan, KT (KT=kiniesotherapist)     Clinical evaluations only:  Includes clinical testing, refers to other programs  or local certified driving instructor for behind the wheel testing.   [facility] (outpatient Rehab) Medical Marguerite Pinal [address] [place], [place] [address] [phone] for scheduling [email or web address] Evaluators:  Burnard Lipoma, OT; Mallie Ellen, OT   Other area clinical evaluators available upon request including [facility], [facility] and [facility].                                           Resource List What is a Industrial/product Designer: Your Road Ahead - A Guide to [employer] Evaluations http://www.thehartford.com/resources/mature-market-excellence/publications-on-aging   Association for Academic Librarian - Disability and Driving Fact Sheets http://www.aded.net/?page=510   Driving after a Brain Injury: Brain Injury Association of [employer] vcshow.co.za?A=SearchResult&SearchID=9495675&ObjectID=2758842&ObjectType=35   Driving with Adaptive Equipment: Transport Planner Association https://aguirre-arnold.org/

## 2023-02-16 NOTE — Progress Notes (Signed)
 [facility] [address] [place]. [place] [address] [phone]       OFFICE FOLLOW UP NOTE  [name] Date of Birth:  Nov 29, 1945 Medical Record Number:  [id number]   Primary neurologist: Dr. Rosemarie Reason for visit: Memory loss, syncopal event, hx of stroke   Chief Complaint  Patient presents with   Follow-up    Patient in room #3 with her husband. Patient states she been okay with her blackout but been having a lot of UTI. Patient she would like to be prescribe of memory issues Rx.     HPI:   Update 02/16/2023 JM: Patient returns for follow-up visit accompanied by her husband. Main concern today is in regards to short term memory difficulties. Denies any recent changes or progression but has been persistent over the past couple of years. Able to maintain ADL and IADLs independently she questions return back to driving, hasn't been driving since possible partial seizures, last seizure like activity over 6 months ago. Husband is concerned regarding her return back to driving more so due to her memory, she is wanting to drive just short distances. She is overall sedentary with limited activity and no routine exercise. She enjoys playing Mahjong tile game on her phone, no other cognitive activities/exercises. Intermittent use of CPAP, doesn't sleep well at night, can fall asleep easily during the day. Endorses increase stressors  and underlying depression/anxiety with feeling functionally limited and being able to do as much as she wants as well as recent death of her mother in law and other family members recently passing. MOCA today 28/30, prior MMSE 27/30 (05/2022). Denies family hx of dementia.   No additional syncopl events or seizure like activity. Denies new stroke/TIA symptoms. Routinely follows with PCP for stroke risk factor management.      History provided for reference purposes only Update 76/29/2024 Dr. Rosemarie: She returns for follow-up after  last visit 6 months ago.  She is accompanied by her daughter.  Patient states she was admitted to the hospital on 12/02/2022 with suspected syncopal event.  She states that she was home alone she tried to get up and felt nauseous then she must of lost consciousness.  She came to on the floor wedged beneath the sofa in the carpet.  She was unable to get up for 2 hours till the husband came and found her.  She had hit the back of her head and had a bruise.  She felt dizzy and was stumbling and off-balance as well as had a headache.  In the ER she was found to have no evidence of pulmonary embolism or significant fractures.  She is found to have mild UTI.  She was discharged on antibiotics and on 10 with codeine .  She continues to have daily headaches which are mild to moderate.  She takes 1 or 2 tablets of Tylenol  3 which seems to help.  She also feels she has significant stiffness of her shoulder which she had hurt on the right as well as back of the neck.  She denies true vertigo but feels she is off balance.  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  No acute findings.  EEG on 06/09/2022 was normal.  Recent echocardiogram in 11/30/2022 showed ejection fraction of 60 to 65%.  Left atrial size was normal.  Hemoshe is is  free globin A1c on 09/22/2022 was 6.3.  I recommended Keppra  for seizure prophylaxis at last visit with patient took it only for short while and stopped taking it.  Update 76/05/2022 Dr. Rosemarie: She returns for follow-up after last visit 8 months ago.  She is accompanied by her daughter [name].  Patient continues to have mild cognitive and short-term memory difficulties.  She has good days and bad days.  She may often remember things later.  Is quite independent in activities of daily living.  She can drive and cook.  Last month she had an episode where her  granddaughter noticed that she was staring and had a spacey look on her face.  She is not responding when spoken to and following commands.  She remains confused and disoriented for short time.  daughter she has had at least 3 Episodes.  One of them occurred in the setting of urinary tract infection while admitted to outside hospital.  No episodes later on the same day as the last 1.  Review of ER visit note from 05/07/2022 states that the patient had altered mental status possibly presyncopal event.  CT scan of the head showed chronic white matter changes and atrophy changes without acute abnormality.  UA, CBC and BMP labs unremarkable.  Review of [facility] admission note from 11/04/2021 mentions reason for admission as drowsiness and dizziness with urinary tract infection and pneumonitis no mention of seizure-like episode.  CT head during that admission also showed no acute abnormalities.  Patient has no prior known history of seizures or episodes of loss of consciousness or significant head injury.  On cognitive testing today she did quite well and scored 27/30 on MMSE this however slight decline from 30/30 last visit on 09/18/2021.  Update 76/10/2021 JM: Patient scheduled today's visit after prior visit with Dr. Rosemarie 10 months ago for concern of memory loss and brain fog.   Feels like having a harder time registering what is being said to her during conversation. She has little motivation to do activity or do prior things she was interested in such as going to see a movie or going to a football game. She still manages/rents houses, has had a hard time getting things done such as repairs either due to lack of motivation or will forget she was supposed to do certain tasks.  She does admit to poor sleep, wakes up frequently throughout the night, at times only gets 3-4 hrs.  Admits to intermittent use of CPAP as she has a difficulty tolerating.  Plans on repeating HST to see if apnea still present as  initial AHI low.  She does admit to increased stressors at home with family as well as likely underlying depression.  Geriatric depression score 9.  She voices frustration regarding her overall health and multiple health issues.  Does report her father had dementia which did not develop towards end-of-life while in nursing home.  No other family history of dementia.  She is greatly concerned/worried that she has developed dementia.  MMSE today 30/30 (prior 27/30 03/2020) Extensive evaluation back in 03/2020 for complaints of memory loss largely unremarkable PCP completed B12 and TSH which were normal.   Reports headaches have been off and on. Usually has 1 headache per week but not always severe. She believes majority are coming from her poor eyesight.  She is being closely followed by ophthalmology.  No change in headache frequency or characteristics since prior visit. She stopped taking topamax  since prior visit as  she did not feel it was helping her headaches. She was seen in ED 1/17 with dizziness and headache with workup largely unremarkable and headache improved after migraine cocktail.  Towards the end of visit, she also mentions continued poor balance which is not a new issue and denies any worsening.  She questions trying physical therapy for possible benefit.  No other concerns at this time  Update 11/15/2020 Dr. Rosemarie She returns for follow-up after last visit with [facility] nurse practitioner on 09/04/2020.  She states headaches are slightly better but still occur 2-3 times per week particularly at night she often has to wake up.  She has to take 2 tablets of Tylenol  which seems to help and she is able to go back to sleep.  She also has to put prednisone  drops into her eyes which also helped.  She did try Topamax  and did all right for a while she was taking 1 tablet a day and it seemed to help but when she increase it to 2 tablets a day it made her quite sleepy and hence she stopped it after 2  weeks.  She does complain of some posterior neck muscle tightness and pain which she has not been doing any neck stretching exercises.  She continues to have mild gait and balance difficulties but these are longstanding and unchanged.  She has no new complaints.   Update 09/03/2020 JM: Ms. Collier returns for sooner scheduled visit for headaches. She reports being seen by her ophthalmologist who advised her to follow-up with our office for further evaluation.  Extensive work-up completed after prior visit in 04/2020 including MR brain, MR cervical, EEG and lab work which was all unremarkable.  Reports over the past 3 months, she has been experiencing left orbital and periorbital headaches.  Initially experiencing them daily but have since been gradually improving.  She may experience dull headache daily but at times can be severe throbbing headache accompanied by blurred vision but denies actual loss of vision.  She was evaluated by her ophthalmologist with history of open-angle glaucoma bilaterally with recent exam normal IOP bilaterally.  She also has chronic iritis bilaterally with use of prednisone  drops which she reports will improve orbital headache.  She also intermittently use Tylenol  with benefit.  Denies photophobia, phonophobia or N/V.  She was seen by her PCP who did lab work on 6/27 with a normal CRP and sed rate.  Update 04/03/2020 Dr. Rosemarie: She returns for follow-up after last visit year and half ago.  She has new complaints of worsening balance, dizziness, memory as well as headaches and neck pain.  She states her balance is poor and she has had a few falls but no injuries.  She has to be careful and often can catch her self and does not fall.  She denies any significant neck pain radiating down her spine but does complain of tightness in the neck and trouble moving her neck.  She has had no recent neck x-rays or scans done.  She also complains of posterior headaches as well as bifrontal  headaches which are intermittent and last only few minutes.  She takes some Tylenol  which seems to help.  Cold compression also seems to help her headaches.  She did undergo some carotid ultrasound at last visit after she saw me on 10/29/2018 which showed no significant extracranial stenosis and transcranial Doppler study was also done which showed poor left improvement of but normal velocities in the vessels that could be studied.  On inquiry she admits to numbness in the right hand which is intermittent but more in the night.  She does agree that the numbness seems to more prominent when she performs activities with rapid repetitive wrist flexion.  Initial visit 10/20/2018 Dr. Rosemarie: Ms. Arel is a 78 year old African-American lady seen today for initial office consultation visit for stroke.  She is accompanied by her husband today.  History is obtained from them and review of referral notes and have personally reviewed imaging films in PACS.  The patient had been having steady decline in the vision acuity in the right eye and for which she was seen by neuro-ophthalmology recently.  They ordered an MRI of the brain and orbits which was done on 10/05/2018 which I personally reviewed MRI of the orbits was unremarkable but MRI scan of the brain showed a small left thalamic acute infarct.  Patient had no neurological symptoms at that time and this is likely an incidental finding.  Patient denied any symptoms of right-sided numbness, tingling, weakness gait or balance problems or memory problems.  She thought a few days prior to her MRI she had some twitchings in the left side of the face but this lasted only a few hours and recovered.  She denied any increased watering of the eyes lack of sensation or weakness.  Patient has no prior history of strokes, TIAs, seizures, migraines or significant neurological problems.  She had lab work on 10/07/2018 which showed LDL cholesterol of 71 mg percent.  She takes Lipitor 40 mg  daily.  She had a hemoglobin A1c of 6.1 on 07/20/2018.  Her blood pressure is usually well controlled and today it is 134/77.  Her fasting sugars have all been good in the 110 range.  She also has sleep apnea and is quite compliant with her CPAP and takes it every night.  She has not had any vascular imaging studies done for the brain of the neck.  She has no neurological complaints today.  ROS:   14 system review of systems is positive for those listed in HPI and all other systems negative e and all other systems negative  PMH:  Past Medical History:  Diagnosis Date   Anemia    3 months ago anemic   Anxiety    on meds   Arthritis    all over (01/15/2017)   Asthma    Bronchitis with emphysema    Chest pain    Chronic bronchitis (HCC)    Coronary artery disease    a. 01/2017 she underwent orbital atherectomy/DES to the proxmal LAD and PTCA to ostial D2. 2D Echo 01/15/17 showed mild LVH, EF 60-65%, grade 1 DD.   Family history of anesthesia complication    daughter N/V   Fibromyalgia    GERD (gastroesophageal reflux disease)    on meds   Glaucoma    Heart murmur    History of hiatal hernia    Hx of echocardiogram    Echo (03/2013):  Tech limited; Mild focal basal septal hypertrophy, EF 60-65%, normal RVF   Hyperlipidemia    Hypertension    Nonsustained ventricular tachycardia (HCC)    OSA on CPAP    Premature atrial contractions    PVC (premature ventricular contraction)    a. Holter 12/16: NSR, occ PAC,PVCs   Sarcoidosis    Type II diabetes mellitus (HCC)     Social History:  Social History   Socioeconomic History   Marital status: Married    Spouse name:  Chris   Number of children: 2   Years of education: Not on file   Highest education level: Not on file  Occupational History   Occupation: unemployed    Employer: [employer]  Tobacco Use   Smoking status: Former    Current packs/day: 0.00    Average packs/day: 1 pack/day for 4.0 years (4.0 ttl  pk-yrs)    Types: Cigarettes    Start date: 02/11/1976    Quit date: 02/11/1980    Years since quitting: 43.0   Smokeless tobacco: Never  Vaping Use   Vaping status: Never Used  Substance and Sexual Activity   Alcohol  use: No   Drug use: No   Sexual activity: Yes  Other Topics Concern   Not on file  Social History Narrative   Lives with husband   Right hand   Drinks 2-3 cups caffeine daily   Social Drivers of Health   Financial Resource Strain: Low Risk  (04/30/2022)   Overall Financial Resource Strain (CARDIA)    Difficulty of Paying Living Expenses: Not hard at all  Food Insecurity: No Food Insecurity (12/04/2022)   Hunger Vital Sign    Worried About Running Out of Food in the Last Year: Never true    Ran Out of Food in the Last Year: Never true  Transportation Needs: No Transportation Needs (12/04/2022)   PRAPARE - Administrator, Civil Service (Medical): No    Lack of Transportation (Non-Medical): No  Physical Activity: Inactive (04/30/2022)   Exercise Vital Sign    Days of Exercise per Week: 0 days    Minutes of Exercise per Session: 0 min  Stress: Stress Concern Present (04/30/2022)   [employer] of Occupational Health - Occupational Stress Questionnaire    Feeling of Stress : To some extent  Social Connections: Unknown (11/04/2021)   Received from [facility], [facility]   Social Network    Social Network: Not on file  Intimate Partner Violence: Not At Risk (12/04/2022)   Humiliation, Afraid, Rape, and Kick questionnaire    Fear of Current or Ex-Partner: No    Emotionally Abused: No    Physically Abused: No    Sexually Abused: No    Medications:   Current Outpatient Medications on File Prior to Visit  Medication Sig Dispense Refill   acetaminophen  (TYLENOL ) 500 MG tablet Take 1,000 mg by mouth 2 (two) times daily as needed for moderate pain or headache.     albuterol  (PROVENTIL ) (2.5 MG/3ML) 0.083% nebulizer solution Take 3 mLs (2.5 mg  total) by nebulization daily as needed for wheezing or shortness of breath. 75 mL 12   albuterol  (VENTOLIN  HFA) 108 (90 Base) MCG/ACT inhaler Inhale 2 puffs into the lungs every 6 (six) hours as needed for wheezing or shortness of breath. 18 g 12   ANORO ELLIPTA  62.5-25 MCG/ACT AEPB INHALE 1 PUFF INTO THE LUNGS DAILY AT 6 AM 60 each 3   aspirin  EC 81 MG tablet Take 81 mg by mouth daily. Swallow whole.     Blood Glucose Monitoring Suppl (ACCU-CHEK AVIVA PLUS) w/Device KIT Use to check blood sugars 3 times a day. Dx code e11.65 1 kit 3   brimonidine  (ALPHAGAN  P) 0.1 % SOLN Place 1 drop into both eyes in the morning, at noon, and at bedtime.     brinzolamide  (AZOPT ) 1 % ophthalmic suspension Place 1 drop into both eyes 3 (three) times daily.     carvedilol  (COREG ) 25 MG tablet Take 1  tablet (25 mg total) by mouth 2 (two) times daily. 180 tablet 3   cephALEXin  (KEFLEX ) 500 MG capsule Take 1 capsule (500 mg total) by mouth 3 (three) times daily. 21 capsule 0   cetirizine  (ZYRTEC  ALLERGY) 10 MG tablet Take 1 tablet (10 mg total) by mouth daily. 30 tablet 2   clopidogrel  (PLAVIX ) 75 MG tablet TAKE 1 TABLET(75 MG) BY MOUTH DAILY 90 tablet 2   EPINEPHrine  0.3 mg/0.3 mL IJ SOAJ injection Inject 0.3 mg into the muscle as needed for anaphylaxis. 1 each 0   ezetimibe  (ZETIA ) 10 MG tablet Take 1 tablet (10 mg total) by mouth daily. 90 tablet 1   furosemide  (LASIX ) 40 MG tablet Take 1 tablet (40 mg total) by mouth daily as needed. 90 tablet 1   ibuprofen  (ADVIL ) 600 MG tablet Take 1 tablet (600 mg total) by mouth every 6 (six) hours as needed. 30 tablet 0   isosorbide  mononitrate (IMDUR ) 60 MG 24 hr tablet TAKE 1 TABLET(60 MG) BY MOUTH DAILY 90 tablet 2   JANUMET  50-500 MG tablet TAKE 1 TABLET BY MOUTH TWICE DAILY WITH A MEAL 90 tablet 1   ketorolac  (ACULAR ) 0.5 % ophthalmic solution Place 1 drop into the right eye 3 (three) times daily.     Lancets (ONETOUCH DELICA PLUS LANCET33G) MISC CHECK BLOOD SUGAR BEFORE  BREAKFAST AND DINNER 100 each 1   montelukast  (SINGULAIR ) 10 MG tablet TAKE 1 TABLET BY MOUTH EVERY DAY 90 tablet 1   Nebulizers (COMPRESSOR/NEBULIZER) MISC Use as directed 1 each 0   nitroGLYCERIN  (NITROSTAT ) 0.4 MG SL tablet Place 1 tablet (0.4 mg total) under the tongue every 5 (five) minutes as needed for chest pain. 25 tablet prn   pantoprazole  (PROTONIX ) 40 MG tablet Take 1 tablet (40 mg total) by mouth daily. 90 tablet 2   Polyvinyl Alcohol -Povidone PF (REFRESH) 1.4-0.6 % SOLN Place 1-2 drops into both eyes 3 (three) times daily as needed (dry/irritated eyes.).     potassium chloride  SA (KLOR-CON ) 20 MEQ tablet Take 1 tablet (20 mEq total) by mouth every other day. 90 tablet 1   prednisoLONE  acetate (PRED FORTE ) 1 % ophthalmic suspension Place 2-3 drops into both eyes in the morning, at noon, and at bedtime. Place 2 Drops in Left Eye, Place 3 Drops In Right Eye TID     pregabalin  (LYRICA ) 75 MG capsule TAKE 1 CAPSULE(75 MG) BY MOUTH TWICE DAILY 180 capsule 1   RHOPRESSA  0.02 % SOLN Place 1 drop into the left eye at bedtime.     tiZANidine  (ZANAFLEX ) 4 MG tablet Take 0.5 tablets (2 mg total) by mouth every 6 (six) hours as needed for muscle spasms. 15 tablet 0   atorvastatin  (LIPITOR) 80 MG tablet Take 1 tablet (80 mg total) by mouth daily. (Patient not taking: Reported on 02/16/2023) 90 tablet 3   loperamide  (IMODIUM ) 2 MG capsule Take 1 capsule (2 mg total) by mouth 4 (four) times daily as needed for diarrhea or loose stools. (Patient not taking: Reported on 01/01/2023) 12 capsule 0   ondansetron  (ZOFRAN -ODT) 4 MG disintegrating tablet Take 1 tablet (4 mg total) by mouth every 8 (eight) hours as needed for nausea or vomiting. (Patient not taking: Reported on 02/16/2023) 20 tablet 0   No current facility-administered medications on file prior to visit.    Allergies:   Allergies  Allergen Reactions   Crestor [Rosuvastatin Calcium ] Other (See Comments)    muscle aches   Demerol  [Meperidine  Hcl]  Other reaction(s): Hallucinations   Shellfish Allergy Itching and Other (See Comments)    Crab, shrimp and lobster ---lips itch and tingle Was told not to eat again after having a allergy test. Lobster, crab and shrimp     Physical Exam Today's Vitals   02/16/23 1122  BP: (!) 167/87  Pulse: 69  Weight: 165 lb (74.8 kg)  Height: 5' 2 (1.575 m)   Body mass index is 30.18 kg/m.   General: well developed, well nourished pleasant elderly African-American lady, seated, in no evident distress Head: head normocephalic and atraumatic.   Neck: supple with no carotid or supraclavicular bruits Cardiovascular: regular rate and rhythm, no murmurs Musculoskeletal: no deformity.   Skin:  no rash/petichiae Vascular:  Normal pulses all extremities  Neurologic Exam Mental Status: Awake and fully alert.  Fluent speech and language.  Oriented to place and time. Recent memory subjectively impaired and remote memory intact. Attention span, concentration and fund of knowledge appropriate during visit.  Provided extensive history and lots of information during visit without difficulty.  Mood and affect flat Cranial Nerves: Pupils equal, briskly reactive to light. Extraocular movements full without nystagmus. Visual fields full to confrontation.  Diminished vision acuity bilaterally (chronic).  Hearing slightly diminished bilaterally. Facial sensation intact. Face, tongue, palate moves normally and symmetrically.  Motor: Normal bulk and tone. Normal strength in all tested extremity muscles. Sensory.: intact to touch , pinprick , position and vibratory sensation.   Coordination: Rapid alternating movements normal in all extremities. Finger-to-nose and heel-to-shin performed accurately bilaterally. Gait and Station: Arises from chair without difficulty. Stance is normal. Gait demonstrates normal stride length and mild imbalance with use of cane.  Reflexes: 1+ and symmetric. Toes downgoing.       02/16/2023   12:07 PM  Montreal Cognitive Assessment   Visuospatial/ Executive (0/5) 4  Naming (0/3) 3  Attention: Read list of digits (0/2) 2  Attention: Read list of letters (0/1) 1  Attention: Serial 7 subtraction starting at 100 (0/3) 3  Language: Repeat phrase (0/2) 2  Language : Fluency (0/1) 1  Abstraction (0/2) 2  Delayed Recall (0/5) 4  Orientation (0/6) 6  Total 28      05/15/2022    1:20 PM 09/18/2021    7:46 AM 04/03/2020    1:22 PM  MMSE - Mini Mental State Exam  Orientation to time 4 5 5   Orientation to Place 5 5 5   Registration 2 3 3   Attention/ Calculation 5 5 3   Recall 3 3 2   Language- name 2 objects 2 2 2   Language- repeat 1 1 1   Language- follow 3 step command 3 3 3   Language- read & follow direction 1 1 1   Write a sentence 1 1 1   Copy design 0 1 1  Total score 27 30 27        IMAGING/LABS  MR brain w wo 04/2020 -chronic left thalamic lacunar infarct -negative for acute findings MR cervical 04/2020 - stable, mild disc bulging at C3-4 down to C6-7 EEG 04/2020  unremarkable Lab work 04/2020 WNL (sed rate, dementia panel, lipid panel and A1c)        ASSESSMENT/PLAN: 78 year old African-American lady with asymptomatic left subcortical lacunar infarct in August 2020 discovered at the time of brain imaging for vision difficulties which is unrelated and likely from glaucoma and eye problem.  Vascular risk factors of hypertension, hyperlipidemia, diabetes and sleep apnea with CPAP noncompliance.  At 03/2020 visit, new complaints of worsening balance, right hand  numbness, memory loss, headaches -extensive evaluation unremarkable but continued to experience frontal and left orbital headaches.   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  She also has mild memory and cognitive difficulties due to mild cognitive impairment.   Prior complaints in 11/2022 of daily headache and dizziness following syncopal event with postconcussive symptoms which has since improved without any additional syncopal events.     -main concern today is regarding short term memory difficulties, will place referral to neuropsych for neurocognitive testing especially as prior MMSE's and recent MOCA WNL. Suspect multifactoral cause of memory complaints including prior stroke, noncompliance with CPAP, suboptimal control of depression/anxiety, polypharmacy and being sedentary. Discussed all of this at length today, advised to use CPAP nightly, increase physical and cognitive exercises and f/u with PCP if depression/anxiety continues to be a concern. Can consider use of Aricept or Namenda in the future as needed but would prefer to hold off at this time.  -in regards to return to driving, would recommend waiting until neurocognitive evaluation or undergo driving evaluation as husband has concerns regarding her returning back to driving with her memory. Discussed reaction time and multitasking can be impaired with memory difficulties. She verbalized understanding.  -No additional seizure-like activity, EEG 01/2023 no evidence of seizures, can hold off on restarted ASM unless she has more seizure-like events - she was advised to call if this should occur  -continue to follow with PCP for stroke risk factor management      Follow up in 6 months for repeat MOCA or call earlier     I spent a prolonged 40 minutes of face-to-face and non-face-to-face time with patient and husband.  This included previsit chart review, lab review, study review, order entry, electronic health record documentation, patient education and discussion regarding above diagnoses and treatment plan and answered all other questions to patient and husbands satisfaction  Harlene Bogaert, AGNP-BC  [facility] [address] [place], [place]  [address]  Phone [phone] Fax [phone] Note: This document was prepared with digital dictation and possible smart phrase technology. Any transcriptional errors that result from this process are unintentional.

## 2023-02-18 ENCOUNTER — Telehealth: Payer: Self-pay | Admitting: Adult Health

## 2023-02-18 NOTE — Telephone Encounter (Signed)
 Referral for neuropsychology fax to [facility]. [phone], Fax: [phone]

## 2023-02-22 NOTE — Progress Notes (Deleted)
 HPI  F former smoker followed for bronchitis, hx  Occular sarcoid, bronchitis/ nodules, OSA complicated by GERD, glaucoma, DM2 NPSG 12/25/03- AHI  2.9/ hr, desaturation to 91%, body weight 164 lbs NPSG-10/18/13- Mild OSA, AHI 9.3/ hr, weight 164 lbs  ACE 09/17/15-47-WNL ACE level 08/18/16-30 ACE level 05/14/20- 22 PFT: 01/07/2011-FEV1 1.59/79%, FEV1/FVC 0.73, FEF 25-75% improved to 38% with bronchodilator. TLC 90% DLCO 55% Office Spirometry 06/09/16- moderate restriction of exhaled volume. FVC 1.51/68%, FEV1 1.20/70%, ratio 0.79, FEF 25-75% 1.19/73% Nuclear Stress Test 09/04/16- EF 65%, low risk. ECHO 07/31/16- Gr 1 DD Walk test on room air 12/21/2017-minimal saturation 94%, maximum heart rate 118/minute with no stops walking 3 laps x180 m. Office Spirometry 12/21/17 PFT 04/13/20- Minimal Obstruction and Minimal Restriction, no resp to BD, Moderately severe DLCO defect WALK TEST 4/422- 2 laps, min O2 sat 94%, max HR 127.  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 Pulmonary Rehab- done               ED for Covid 01/30/22> benzonatate  CPAP  Luna- 5-15 / [facility]    Saw Dr Theophilus for replacement [id number] Download compliance- CPAP used 2-3 days ago. Body weight today-166 lbs Not driving for 6 months after possible seizure, Sputum cultures from June all nl flora/ yeast, neg AFB. Took doxy after these cultures obtained. Had Flu vax Still some night sweats, productive cough, DOE but not worse. She is  aware memory not good- forgets CPAP- discussed. Had flu vax. CT chest 07/25/22- IMPRESSION: 1. New irregular solid nodule of the right lung apex, dominant nodule described on prior exam has resolved, and new clustered small solid nodules are seen in the posterior right upper lobe. Findings are likely due to chronic atypical infection. Recommend continued follow-up based on patient risk factors. 2. 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 Am JINNY Honey Crit Care Med Vol 198, Iss 5, [phone], Oct 11 2016. 3. Aortic Atherosclerosis (ICD10-I70.0) and Emphysema (ICD10-J43.9).  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 Pulmonary Rehab- done               ED for Covid 01/30/22> benzonatate  CPAP  Luna- 5-15 / [facility]    Saw Dr Theophilus for replacement [id number] Download compliance- Body weight today- Recent f/u with Dr Geronimo. LOV Dr Shelah in November for RUL cavitating nodule apparently resolved as of latest CT.  CT chest 01/23/23 IMPRESSION: 1. Waxing and waning tree-in-bud and clustered nodular changes in the right apex, now measuring up to 6 x 5 mm. Previously noted spiculated cavitary nodule is no longer seen. Findings are favored to represent a chronic atypical infectious process. Recommend continued follow-up based  on patient risk factors. 2. Similar lower lung predominant fibrotic changes. 3. Aortic Atherosclerosis  (ICD10-I70.0) and Emphysema (ICD10-J43.9). Coronary artery calcifications. Assessment for potential risk factor modification, dietary therapy or pharmacologic therapy may be warranted, if clinically indicated.  ROS-see HPI     + = positive Constitutional:   No-   weight loss, night sweats, fevers, chills, fatigue, lassitude. HEENT:   +  headaches, difficulty swallowing, tooth/dental problems, sore throat,       Some  sneezing, itching, ear ache, +nasal congestion, post nasal drip,  CV: + chest pain, no-orthopnea, PND, +swelling in lower extremities, anasarca, dizziness, palpitations Resp: +  shortness of breath with exertion not at rest.            +productive cough,  + non-productive cough,  No- coughing up of blood.              + color of mucus.   wheezing.   Skin: No-   rash or lesions. GI:  +  heartburn, indigestion, abdominal pain, nausea, vomiting,  GU:  MS:  +  joint pain or swelling, + back pain Neuro-     nothing unusual Psych:  No- change in mood or affect. No depression or anxiety.  No memory loss.  OBJ  Afebrile General- Alert, Oriented, Affect tearful, Distress- none acute,  Skin- rash-none, lesions- none, excoriation- none Lymphadenopathy- none Head- atraumatic            Eyes- Gross vision intact, PERRLA, conjunctivae clear secretions- not injected            Ears- Hearing aid             Nose-  turbinate edema, no-Septal dev, mucus, polyps, erosion, perforation             Throat- Mallampati III , mucosa  , drainage- none, tonsils- atrophic Neck- flexible , trachea midline, no stridor , thyroid  nl, carotid no bruit Chest - symmetrical excursion , unlabored           Heart/CV- RRR , no murmur , no gallop  , no rub, nl s1 s2                           - JVD- none , edema+3, stasis changes- none, varices- none           Lung-  crackles+faint, Cough-none, wheeze-none, rhonchi-none , dullness-none, rub- none           Chest wall-  Abd- No HSM Br/ Gen/ Rectal- Not done,  not indicated Extrem- cyanosis- none, clubbing, none, atrophy- none, strength- nl. +elastic hose, +cane Neuro- grossly intact to observation

## 2023-02-23 ENCOUNTER — Encounter (HOSPITAL_BASED_OUTPATIENT_CLINIC_OR_DEPARTMENT_OTHER): Payer: Self-pay | Admitting: Family

## 2023-02-23 ENCOUNTER — Ambulatory Visit: Payer: Medicare Other | Admitting: Internal Medicine

## 2023-02-23 ENCOUNTER — Ambulatory Visit (HOSPITAL_BASED_OUTPATIENT_CLINIC_OR_DEPARTMENT_OTHER): Payer: Medicare Other | Admitting: Family

## 2023-02-23 ENCOUNTER — Encounter: Payer: Self-pay | Admitting: Internal Medicine

## 2023-02-23 ENCOUNTER — Ambulatory Visit (HOSPITAL_BASED_OUTPATIENT_CLINIC_OR_DEPARTMENT_OTHER): Payer: Medicare Other

## 2023-02-23 VITALS — BP 156/88 | HR 83 | Ht 62.0 in | Wt 162.0 lb

## 2023-02-23 VITALS — BP 118/72 | HR 72 | Ht 62.0 in | Wt 165.6 lb

## 2023-02-23 DIAGNOSIS — Z87898 Personal history of other specified conditions: Secondary | ICD-10-CM

## 2023-02-23 DIAGNOSIS — I25118 Atherosclerotic heart disease of native coronary artery with other forms of angina pectoris: Secondary | ICD-10-CM

## 2023-02-23 DIAGNOSIS — R5383 Other fatigue: Secondary | ICD-10-CM

## 2023-02-23 DIAGNOSIS — I4729 Other ventricular tachycardia: Secondary | ICD-10-CM

## 2023-02-23 DIAGNOSIS — Z8669 Personal history of other diseases of the nervous system and sense organs: Secondary | ICD-10-CM | POA: Diagnosis not present

## 2023-02-23 DIAGNOSIS — E785 Hyperlipidemia, unspecified: Secondary | ICD-10-CM

## 2023-02-23 DIAGNOSIS — J849 Interstitial pulmonary disease, unspecified: Secondary | ICD-10-CM

## 2023-02-23 NOTE — Progress Notes (Signed)
 Cardiology Office Note:  .   Date:  02/23/2023  ID:  [name], DOB 1945-11-29, MRN [id number] PCP: [name], MD  [facility] Providers Cardiologist:  Aleene Passe, MD    History of Present Illness: Sheila Reynolds is a 78 y.o. female with hx of CAD (s/p orbital atherectomy and PCI/DES to prox/mid LAD), HLD, palpitations, NSVT, sarcoidosis, pulmonary fibrosis  L/RHC 12/2016 significant single vessel coronary disease with long, calcified prox/mid LAD stenosis upto 70% and 70% ostial D2 stenosis, mild pulmonary hypertension. Subsequently underwent arterectomy 01/15/17. Cardiac PET 03/2022 normal LVEF 58%, low risk with no ischemia nor infarction. Carotid duplex bilateral 1-39% stenosis.   She had ED visit 10/22-10/26/24 with syncope. Syncope occurred while standing eatinga  sandwich after returning to her house from voting. She did have prodrome of feeling weak, tired. She did have 2 similar episodes prior where she nearly blacked out. Echo during admission LVEF 60-65%, no RWMA, no significant valvular abnormalities. Carotid duplex bilateral 1-39% stenosis. Also treated for UTI.   Pleasant lady who presents today for follow up. Enjoys spending time with her 64 month old grandson. Endorses feeling very weak in her body. Notes she falls asleep easily during daytime if she is not actively engaged in conversation. This has been going on a long time. Feels this has worsened over the last 2-3 months. No difficulty sleeping. She is only needing her PRN Lasix  sparingly. Notes last time she took it felt as if it drained all of my body fluids. Her left leg has routinely swollen worse than her right side. Notes she is not taking Atorvastatin  as she thought Zetia  replaced Atorvastatin . Discussed most often utilized together.   ROS: Please see the history of present illness.    All other systems reviewed and are negative.   Studies Reviewed: .        Cardiac Studies &  Procedures   CARDIAC CATHETERIZATION  CARDIAC CATHETERIZATION 01/15/2017  Narrative Conclusions: 1. 70% proximal/mid LAD stenosis (previously found to be hemodynamically significant with resting Pd/Pa of 0.79). 2. 70% ostial D2 stenosis. 3. Successful orbital atherectomy and PCI using Xience Sierra 3.5 x 33 mm DES with 10% proximal stenosis (post-dilated with 4.0  balloon at high pressure) and TIMI-3 flow. 4. Successful PTCA to ostial D2 (including kissing balloon inflation) with reduction in stenosis to 50% and TIMI-3 flow.  Recommendations: 1. Dual antiplatelet therapy with aspirin  and clopidogrel  for at least 6 months, ideally longer. 2. Aggressive secondary prevention. 3. Overnight observation and cardiac rehab after discharge.  [name], MD [facility] Pager: [phone]  Findings Coronary Findings Diagnostic  Dominance: Co-dominant  Left Main Vessel is large.  Left Anterior Descending Prox LAD to Mid LAD lesion is 70% stenosed.  First Diagonal Branch Vessel is small in size.  Second Diagonal Branch Vessel is moderate in size. Ost 2nd Diag lesion is 70% stenosed.  Left Circumflex Vessel is large.  First Obtuse Marginal Branch Vessel is moderate in size.  Second Obtuse Marginal Branch Vessel is small in size.  Third Obtuse Marginal Branch Vessel is moderate in size.  Fourth Obtuse Marginal Branch Vessel is small in size.  Intervention  Prox LAD to Mid LAD lesion Atherectomy Orbital atherectomy was performed. Stent Lesion length:  28 mm. A drug-eluting stent was successfully placed using a STENT SIERRA 3.50 X 33 MM. Stent strut is well apposed. Post-Intervention Lesion Assessment The intervention was successful. Pre-interventional TIMI flow is 3. Post-intervention TIMI flow is  3. No complications occurred at this lesion. There is a 10% residual stenosis post intervention.  Ost 2nd Diag lesion Angioplasty Balloon angioplasty was  performed. Post-Intervention Lesion Assessment The intervention was successful. Pre-interventional TIMI flow is 3. Post-intervention TIMI flow is 3. There is a 50% residual stenosis post intervention.   CARDIAC CATHETERIZATION  CARDIAC CATHETERIZATION 12/12/2016  Narrative Conclusions: 1. Significant single-vessel coronary artery disease with long, calcified proximal/mid LAD stenosis of up to 70% (resting PD/PA 0.79), as well as 70% ostial D2 stenosis. 2. No significant disease involving codominant LCx and RCA. 3. Upper normal to mildly elevated left and right heart filling pressures. 4. Mild pulmonary hypertension. 5. Low normal Fick cardiac output/index.  Recommendations: 1. Optimize medical therapy, including addition of isosorbide  mononitrate and clopidogrel . 2. Patient should follow-up in 1-2 weeks with Dr. Alveta or Katheryn Jude to reassess her symptoms.  If she continues to have significant shortness of breath and/or dyspnea, PCI to the proximal/mid LAD with orbital atherectomy should be considered. 3. Aggressive secondary prevention.  [name], MD [facility] Pager: [phone]  Findings Coronary Findings Diagnostic  Dominance: Co-dominant  Left Main Vessel is large.  Left Anterior Descending The lesion is moderately calcified.  First Diagonal Branch Vessel is small in size.  Second Diagonal Branch Vessel is moderate in size. Pressure wire/FFR was performed on the lesion. FFR: 0.79.  Left Circumflex Vessel is large.  First Obtuse Marginal Branch Vessel is moderate in size.  Second Obtuse Marginal Branch Vessel is small in size.  Third Obtuse Marginal Branch Vessel is moderate in size.  Fourth Obtuse Marginal Branch Vessel is small in size.  Right Coronary Artery Vessel is moderate in size.  Intervention  No interventions have been documented.   STRESS TESTS  NM PET CT CARDIAC PERFUSION MULTI W/ABSOLUTE BLOODFLOW  03/18/2022  Narrative   The study is normal. The study is low risk.   LV perfusion is normal. There is no evidence of ischemia. There is no evidence of infarction.   Rest left ventricular function is normal. Rest EF: 58 %. Stress left ventricular function is normal. Stress EF: 61 %. End diastolic cavity size is normal. End systolic cavity size is normal. No evidence of transient ischemic dilation (TID) noted.   Myocardial blood flow was computed to be 1.15ml/g/min at rest and 2.84ml/g/min at stress. Global myocardial blood flow reserve was 2.10 and was normal.   Coronary calcium  assessment not performed due to prior revascularization.   Electronically signed by: Soyla DELENA Merck, MD  CLINICAL DATA:  This over-read does not include interpretation of cardiac or coronary anatomy or pathology. The Cardiac PET CT interpretation by the cardiologist is attached.  COMPARISON:  None Available.  FINDINGS: Vascular: Normal heart size. No pericardial effusion. Normal caliber thoracic aorta with mild calcified plaque. Coronary artery calcifications with probable LAD stent.  Mediastinum/Nodes: Esophagus is unremarkable. No pathologically enlarged lymph nodes seen in the chest.  Lungs/Pleura: Central airways are patent. Bibasilar atelectasis. No consolidation, pleural effusion or pneumothorax.  Upper Abdomen: No acute abnormality.  Musculoskeletal: Tiny sclerotic lesions of the lumbar spine, likely bone islands. No aggressive appearing osseous lesions.  IMPRESSION: 1. No acute extracardiac abnormality. 2. Aortic Atherosclerosis (ICD10-I70.0).   Electronically Signed By: Rea Marc M.D. On: 03/18/2022 16:17  ECHOCARDIOGRAM  ECHOCARDIOGRAM COMPLETE 12/04/2022  Narrative ECHOCARDIOGRAM REPORT    Patient Name:   Sheila Reynolds Date of Exam: 12/04/2022 Medical Rec #:  [id number]  Height:       62.0 in Accession #:    [id number]         Weight:       174.2 lb Date of  Birth:  1946/02/06          BSA:          1.803 m Patient Age:    77 years           BP:           119/61 mmHg Patient Gender: F                  HR:           65 bpm. Exam Location:  Inpatient  Procedure: 2D Echo, Cardiac Doppler and Color Doppler  Indications:    Syncope R55  History:        Patient has prior history of Echocardiogram examinations, most recent 09/05/2021. CAD, Arrythmias:PVC and PAC; Risk Factors:Dyslipidemia, Hypertension and Diabetes.  Sonographer:    Tinnie Gosling RDCS Referring Phys: [id number] JAN A MANSY  IMPRESSIONS   1. Left ventricular ejection fraction, by estimation, is 60 to 65%. The left ventricle has normal function. The left ventricle has no regional wall motion abnormalities. Left ventricular diastolic function could not be evaluated. 2. Right ventricular systolic function is normal. The right ventricular size is normal. 3. The mitral valve is normal in structure. No evidence of mitral valve regurgitation. No evidence of mitral stenosis. 4. The aortic valve is normal in structure. Aortic valve regurgitation is not visualized. No aortic stenosis is present. 5. The inferior vena cava is normal in size with greater than 50% respiratory variability, suggesting right atrial pressure of 3 mmHg.  Conclusion(s)/Recommendation(s): Technically limited study due to poor sound wave transmission.  FINDINGS Left Ventricle: Left ventricular ejection fraction, by estimation, is 60 to 65%. The left ventricle has normal function. The left ventricle has no regional wall motion abnormalities. The left ventricular internal cavity size was normal in size. There is no left ventricular hypertrophy. Left ventricular diastolic function could not be evaluated.  Right Ventricle: The right ventricular size is normal. No increase in right ventricular wall thickness. Right ventricular systolic function is normal.  Left [facility]: Left atrial size was normal in size.  Right [facility]:  Right atrial size was normal in size.  Pericardium: There is no evidence of pericardial effusion.  Mitral Valve: The mitral valve is normal in structure. No evidence of mitral valve regurgitation. No evidence of mitral valve stenosis.  Tricuspid Valve: The tricuspid valve is normal in structure. Tricuspid valve regurgitation is not demonstrated. No evidence of tricuspid stenosis.  Aortic Valve: The aortic valve is normal in structure. Aortic valve regurgitation is not visualized. No aortic stenosis is present.  Pulmonic Valve: The pulmonic valve was normal in structure. Pulmonic valve regurgitation is not visualized. No evidence of pulmonic stenosis.  Aorta: The aortic root is normal in size and structure.  Venous: The inferior vena cava is normal in size with greater than 50% respiratory variability, suggesting right atrial pressure of 3 mmHg.  IAS/Shunts: No atrial level shunt detected by color flow Doppler.   LEFT VENTRICLE PLAX 2D LVIDd:         4.40 cm LVIDs:         3.00 cm LV PW:         0.90 cm LV IVS:        0.90 cm LVOT diam:     1.90 cm  LV SV:         30 LV SV Index:   17 LVOT Area:     2.84 cm   IVC IVC diam: 1.40 cm  LEFT ATRIUM         Index LA diam:    3.20 cm 1.78 cm/m AORTIC VALVE LVOT Vmax:   53.50 cm/s LVOT Vmean:  35.200 cm/s LVOT VTI:    0.107 m  AORTA Ao Root diam: 2.70 cm Ao Asc diam:  2.90 cm   SHUNTS Systemic VTI:  0.11 m Systemic Diam: 1.90 cm  Toribio Fuel MD Electronically signed by Toribio Fuel MD Signature Date/Time: 12/04/2022/3:24:46 PM    Final   MONITORS  CARDIAC EVENT MONITOR 02/01/2020  Narrative  Sinus rhythm  Rare PVCs  No evidence of ventricular tachycardia           Risk Assessment/Calculations:             Physical Exam:   VS:  BP 118/72   Pulse 72   Wt 165 lb 9.6 oz (75.1 kg)   SpO2 99%   BMI 30.29 kg/m    Wt Readings from Last 3 Encounters:  02/23/23 165 lb 9.6 oz (75.1 kg)   02/16/23 165 lb (74.8 kg)  01/05/23 169 lb (76.7 kg)    GEN: Well nourished, well developed in no acute distress NECK: No JVD; No carotid bruits CARDIAC: RRR, no murmurs, rubs, gallops RESPIRATORY:  Clear to auscultation without rales, wheezing or rhonchi  ABDOMEN: Soft, non-tender, non-distended EXTREMITIES:  No edema; No deformity   ASSESSMENT AND PLAN: .    Hx of syncope - Hospitalized 11/2022 for syncope. Carotid duplex bilateral 1-39% stenosis. Echo normal LVEF, no significant valvular abnormality. Plan for 14 day ZIO to rule out significant arrhythmia. Encouraged her to check BP at home to assess for significant hypotension.   Fatigue - Update CBC, CMP, TSH today.   CAD / HLD, LDL goal <70 - Stable with no anginal symptoms. No indication for ischemic evaluation.  Notes she has not been taking Atorvastatin  as thought Zetia  replaced it. Update CMP, lipid panel. If LDL not at goal, plan to resume Atorvastatin .   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 Managed with azathioprine . Additionally following with pulmonology.        Dispo: follow up in 2-3 mos (to establish with Dr. Raford)  Signed, Reche GORMAN Finder, NP

## 2023-02-23 NOTE — Patient Instructions (Signed)
 Medication Instructions:  No changes *If you need a refill on your cardiac medications before your next appointment, please call your pharmacy*   Lab Work: Cbc,CMET, Lipid Panel, Thyroid  Panel. If you have labs (blood work) drawn today and your tests are completely normal, you will receive your results only by: MyChart Message (if you have MyChart) OR A paper copy in the mail If you have any lab test that is abnormal or we need to change your treatment, we will call you to review the results.   Testing/Procedures: Sheila Reynolds- Long Term Monitor Instructions  Your physician has requested you wear a ZIO patch monitor for 14 days.  This is a single patch monitor. [employer] supplies one patch monitor per enrollment. Additional stickers are not available. Please do not apply patch if you will be having a Nuclear Stress Test,  Echocardiogram, Cardiac CT, MRI, or Chest Xray during the period you would be wearing the  monitor. The patch cannot be worn during these tests. You cannot remove and re-apply the  ZIO XT patch monitor.  Your ZIO patch monitor will be mailed 3 day USPS to your address on file. It may take 3-5 days  to receive your monitor after you have been enrolled.  Once you have received your monitor, please review the enclosed instructions. Your monitor  has already been registered assigning a specific monitor serial # to you.  Billing and Patient Assistance Program Information  We have supplied [employer] with any of your insurance information on file for billing purposes. [employer] offers a sliding scale Patient Assistance Program for patients that do not have  insurance, or whose insurance does not completely cover the cost of the ZIO monitor.  You must apply for the Patient Assistance Program to qualify for this discounted rate.  To apply, please call [employer] at [phone], select option 4, select option 2, ask to apply for  Patient Assistance Program. M[place]eth will ask your household  income, and how many people  are in your household. They will quote your out-of-pocket cost based on that information.  [employer] will also be able to set up a 29-month, interest-free payment plan if needed.  Applying the monitor   Shave hair from upper left chest.  Hold abrader disc by orange tab. Rub abrader in 40 strokes over the upper left chest as  indicated in your monitor instructions.  Clean area with 4 enclosed alcohol  pads. Let dry.  Apply patch as indicated in monitor instructions. Patch will be placed under collarbone on left  side of chest with arrow pointing upward.  Rub patch adhesive wings for 2 minutes. Remove white label marked 1. Remove the white  label marked 2. Rub patch adhesive wings for 2 additional minutes.  While looking in a mirror, press and release button in center of patch. A small green light will  flash 3-4 times. This will be your only indicator that the monitor has been turned on.  Do not shower for the first 24 hours. You may shower after the first 24 hours.  Press the button if you feel a symptom. You will hear a small click. Record Date, Time and  Symptom in the Patient Logbook.  When you are ready to remove the patch, follow instructions on the last 2 pages of Patient  Logbook. Stick patch monitor onto the last page of Patient Logbook.  Place Patient Logbook in the blue and white box. Use locking tab on box and tape box closed  securely. The blue  and white box has prepaid postage on it. Please place it in the mailbox as  soon as possible. Your physician should have your test results approximately 7 days after the  monitor has been mailed back to [facility].  Call [facility] Customer Care at [phone] if you have questions regarding  your ZIO XT patch monitor. Call them immediately if you see an orange light blinking on your  monitor.  If your monitor falls off in less than 4 days, contact our Monitor department at [phone].  If your  monitor becomes loose or falls off after 4 days call [employer] at [phone] for  suggestions on securing your monitor    Follow-Up: At [employer], you and your health needs are our priority.  As part of our continuing mission to provide you with exceptional heart care, we have created designated Provider Care Teams.  These Care Teams include your primary Cardiologist (physician) and Advanced Practice Providers (APPs -  Physician Assistants and Nurse Practitioners) who all work together to provide you with the care you need, when you need it.  We recommend signing up for the patient portal called MyChart.  Sign up information is provided on this After Visit Summary.  MyChart is used to connect with patients for Virtual Visits (Telemedicine).  Patients are able to view lab/test results, encounter notes, upcoming appointments, etc.  Non-urgent messages can be sent to your provider as well.   To learn more about what you can do with MyChart, go to [email or web address].    Your next appointment:   2-3 month(s)  Provider:   Annabella Scarce, MD       Please check blood pressure 3-4 times per week. Check at least an hour after taking your medications and after sitting for 5-10 minutes.

## 2023-02-23 NOTE — Progress Notes (Signed)
 s    OCT 2023 [name]  She is a patient of Dr. 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  She never had a biopsy done and was treated intermittently with prednisone .  She has been told of late that the sarcoidosis is in remission.  Her recent CT scans have demonstrated pulmonary fibrosis and she has been referred here for further evaluation  Complains of intermittent chest tightness and pain.  Has dyspnea on exertion.  Denies any cough, congestion, sputum production  Pets: Dogs Occupation: Retired it consultant Exposures: No recent mold exposure from a leak in 2022.  No ongoing exposures.  No mold, hot tub, Jacuzzi.  No feather pillows or comforters ILD questionnaire 09/30/2021-negative Smoking history: Quit smoking in 1983 Travel history: No significant travel history Relevant family history: No family history of lung disease.  Interim history: Here for review of PFTs.  States that breathing is stable  Received note from Dr. 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  I agreed with the plan and communicated this with Dr. 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  She does have peribronchovascular nodularity in the upper lobe suggestive of sarcoidosis.  No connective tissue disease serologies or exposures noted.  Sarcoidosis can cause pulmonary fibrosis in a similar pattern.  Alternatively IPF can coexist with sarcoidosis.  Her interstitial lung disease has been stable on CT scans dating back several years.  We discussed further work-up and possible biopsy.  Given stability we will monitor for now PFTs reviewed.  She has been started on azathioprine  by Dr. 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   I have discussed this with him and agree with the treatment as it may be beneficial for the lungs to Follow-up with CT scan in 6 months  Plan/Recommendations: High-resolution CT in 6 months xxxxx 11/24/22-78 yo female former smoker (4 pkyrs) followed for COPD/bronchiolitis/lung nodules, Ocular Sarcoid, ILD/UIP, OSA, complicated by GERD, Glaucoma, DM 2, CAD/ stent, HTN, DDD lumbar spine, Covid infection April 2022, ASCVD/ Aortic, CAD, Lymphedema, Covid infection,  -Singulair , Neb albutr ILD- possible UIP and / or Sarcoid, stable over recent years.  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 Pulmonary Rehab- done               ED for Covid 01/30/22> benzonatate  CPAP  Luna- 5-15 / [employer]    Saw Dr Theophilus for replacement [id number] Download compliance- CPAP used 2-3 days ago. Body weight today-166 lbs Not driving for 6 months after possible seizure, Sputum cultures from June all nl flora/ yeast, neg AFB. Took doxy after these cultures obtained. Had Flu vax Still some night sweats, productive cough, DOE but not worse. She is aware memory not good- forgets CPAP- discussed. Had flu vax. CT chest 07/25/22- IMPRESSION: 1. New irregular solid nodule of the right lung apex, dominant nodule described on prior exam has resolved, and new clustered small solid nodules are seen in the posterior right upper lobe. Findings are likely due to chronic atypical infection. Recommend continued follow-up based on patient risk factors. 2. 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 Am J Respir Crit Care Med Vol 198, Iss 5,  [id number], Oct 11 2016. 3. Aortic Atherosclerosis (ICD10-I70.0) and Emphysema (ICD10-J43.9).   OV 111@4  - ABMLF    Patient ID: ERSILIA BRAWLEY, female    DOB: 1945-06-02, 78  y.o.   MRN: [id number]  HPI The patient is a 78 year old former smoker with a history of COPD, sarcoidosis, diabetes, hypertension, hyperlipidemia, and obstructive sleep apnea (OSA) on CPAP. She was recently hospitalized following an episode of syncope and head trauma, during which no seizure activity was observed. A urinary tract infection was identified and treated with Augmentin  during the hospitalization. A CT pulmonary angiogram (CTPA) performed during the hospital stay revealed a speculated incavitary nodule in the right upper lobe, measuring 1.4 by 2.4 cm, which was slightly larger than on a previous CT chest. The patient has been under the care of Dr. Neysa for her sarcoidosis.  The patient's sarcoidosis was diagnosed based on ocular manifestations and a family history of the condition, although no lung tests were performed. She has been on various medications for the condition, including methotrexate  and steroids, and is currently on azathioprine , which was started less than a year ago. The patient also has a history of arthritis.  The patient experienced a blackout and head trauma, which did not break the skin. A neurologist diagnosed her with a concussion. She has been experiencing numbness on one side of her face and inside her mouth for the past two to three months, which has been evaluated by a dentist with no clear cause identified. The patient's daughter also attended the consultation.   OV 02/23/2023 visit with Dr. Geronimo crawford mistake in scheduling but ultimately decided to stick with Dr. Geronimo for fibrosis]  Subjective:  Patient ID: Alejandra VEAR Goldberg, female , DOB: 15-Jun-1945 , age 78 y.o. , MRN: [id number] , ADDRESS: [address] [place] [address] PCP [name], MD Patient Care Team: [name], MD as PCP - General (Internal Medicine) Raford Riggs, MD as PCP - Cardiology (Cardiology) [name], [name], MD as Consulting Physician (Cardiology) Arnell Rockney PARAS, [facility] (Inactive) (Pharmacist)  This Provider for this visit: Treatment Team:  Attending Provider: Geronimo Amel, MD    02/23/2023 -   Chief Complaint  Patient presents with   Follow-up    Pt states she has been more sob, currently having sinus issues. Using albuterol  and anoro      HPI Doreen H Sobotta 78 y.o. -presents for follow-up.  When I walked in she and husband but under the impression they are going to see Dr. Lamar Chris for the lung nodule.  There is no clear-cut referral from Dr. Chris or Dr. Reggy Neysa to see me.  So history is gained by talking to the patient, husband on also later chart review after she left the office.  It appears that she has sleep apnea for which she is follows with Dr. Neysa and she uses nighttime CPAP without oxygen .  Late in 2024 she got diagnosed with a cavitary spiculated nodule.  She was referred to [facility] by them did a follow-up CT scan of the chest that was read out on Christmas Day 2024.  The nodule spiculated nodule resolved.  She has some chronic changes of sarcoidosis.  She is supposed to see Dr. Byrum for this visit but for unclear reasons she is with me.  Then I reviewed the chart and realized that she has seen Dr. Theophilus twice in 2023 for ILD.  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  As of 2023 her CT scan and pulmonary function test was deemed stable.  Looks like she never followed up back with Dr. Theophilus.  At this point in time she is on Anoro and Singulair  but I do not see azathioprine  or any immunomodulators on her medication list.  She had a ED visit 02/09/2023 for acute cystitis with hematuria.  She did see [facility] cardiology PA today.  She reported to her chronic severe fatigue.  She describes the same to me has been going on for many years.  Review of the records indicate that she has had his ILD.   Most recent CT chest and compared to 2018 all looks stable to me.  At least in the radiology reports she has been stable between 2022 and 2024 with a probable UIP.  She said she had lab work with cardiology today but at the time of this dictation I do not see this.  However end of October 2024 she had a slightly high creatinine 1.01 mg percent slightly high AST.  She has negative serology in 2023 except for trace positive ANA 1: 40.  Echocardiogram October 2024 ejection fraction 65%.  Sit/stand test today pulse ox drop from 100% to 93% after sitting and standing 15 times.  Heart rate ranged from 86 to 102/min.  Myocardial perfusion test normal in 2021  Last set of pulmonary function test November 2024 although the [facility] is declining the TLC has been stable for years.  To me the radiology images ILD is also stable.  Follow-up: I discussed with Dr. Reggy and patient has an appointment with him for sleep apnea.  He said this could be deferred for 6 months.  Did indicate to her that the nodule she saw Dr. Shelah for is resolved..  Therefore I will see her for ILD and she is agreed to that.  SYMPTOM SCALE - ILD 02/23/2023  Current weight   O2 use ra  Shortness of Breath 0 -> 5 scale with 5 being worst (score 6 If unable to do)  At rest 4  Simple tasks - showers, clothes change, eating, shaving 5  Household (dishes, doing bed, laundry) 4  Shopping 4  Walking level at own pace 4  Walking up Stairs 5  Total (30-36) Dyspnea Score 26  How bad is your cough? 3  How bad is your fatigue 4  How bad is nausea 0  How bad is vomiting?  0  How bad is diarrhea? 3  How bad is anxiety? 3  How bad is depression 2  Any chronic pain - if so where and how bad 0      CLINICAL DATA:  Follow-up lung nodule   EXAM: CT CHEST WITHOUT CONTRAST   TECHNIQUE: Multidetector CT imaging of the chest was performed following the standard protocol without IV contrast.   RADIATION DOSE REDUCTION: This exam was  performed according to the departmental dose-optimization program which includes automated exposure control, adjustment of the mA and/or kV according to patient size and/or use of iterative reconstruction technique.   COMPARISON:  CTA chest dated 12/03/2022   FINDINGS: Cardiovascular: Normal heart size. No significant pericardial fluid/thickening. Great vessels are normal in course and caliber. Coronary artery calcifications. LAD coronary stent. Coronary artery calcifications and aortic atherosclerosis.   Mediastinum/Nodes: Imaged thyroid  gland without nodules meeting criteria for imaging follow-up by size. Normal esophagus. No pathologically enlarged axillary, supraclavicular, mediastinal, or hilar  lymph nodes. Unchanged prominent subcentimeter left internal mammary lymph nodes measuring up to 3 mm (2:37).   Lungs/Pleura: The central airways are patent. Scattered changes of centrilobular emphysema. Similar upper lobe bronchiectasis. Similar lower lung predominant fibrotic changes. Again seen are waxing and waning tree-in-bud and clustered nodular changes in the right apex, now measuring up to 6 x 5 mm (8:26). Previously noted spiculated cavitary nodule is no longer seen. No pneumothorax. No pleural effusion.   Upper abdomen: Normal.   Musculoskeletal: No acute or abnormal lytic or blastic osseous lesions. Bilateral breast implants.   IMPRESSION: 1. Waxing and waning tree-in-bud and clustered nodular changes in the right apex, now measuring up to 6 x 5 mm. Previously noted spiculated cavitary nodule is no longer seen. Findings are favored to represent a chronic atypical infectious process. Recommend continued follow-up based on patient risk factors. 2. Similar lower lung predominant fibrotic changes. 3. Aortic Atherosclerosis (ICD10-I70.0) and Emphysema (ICD10-J43.9). Coronary artery calcifications. Assessment for potential risk factor modification, dietary therapy or  pharmacologic therapy may be warranted, if clinically indicated.     Electronically Signed   By: Limin  Xu M.D.   On: 02/04/2023 21:37  PFT     Latest Ref Rng & Units 01/05/2023   10:31 AM 11/27/2021    1:16 PM 04/13/2020   10:06 AM 11/25/2016    4:11 PM  ILD indicators  FVC-Pre L 1.49  1.67  1.66  1.70   FVC-Predicted Pre % 57  63  79  78   FVC-Post L 1.53  1.60  1.59  1.69   FVC-Predicted Post % 59  61  76  78   TLC L 2.89  3.24  3.52  3.29   TLC Predicted % 60  68  73  69   DLCO uncorrected ml/min/mmHg 10.23  9.64  9.27  9.95   DLCO UNC %Pred % 57  53  51  46   DLCO Corrected ml/min/mmHg 10.72  10.38  9.27  10.18   DLCO COR %Pred % 60  57  51  47            has a past medical history of Anemia, Anxiety, Arthritis, Asthma, Bronchitis with emphysema, Chest pain, Chronic bronchitis (HCC), Coronary artery disease, Family history of anesthesia complication, Fibromyalgia, GERD (gastroesophageal reflux disease), Glaucoma, Heart murmur, History of hiatal hernia, echocardiogram, Hyperlipidemia, Hypertension, Nonsustained ventricular tachycardia (HCC), OSA on CPAP, Premature atrial contractions, PVC (premature ventricular contraction), Sarcoidosis, and Type II diabetes mellitus (HCC).   reports that she quit smoking about 43 years ago. Her smoking use included cigarettes. She started smoking about 47 years ago. She has a 4 pack-year smoking history. She has never used smokeless tobacco.  Past Surgical History:  Procedure Laterality Date   BREAST SURGERY     CARDIAC CATHETERIZATION  05/04/2007   reveals overall normal left ventricular systolic function. Ejection fraction 65-70%   CATARACT EXTRACTION W/PHACO Right 07/20/2013   Procedure: CATARACT EXTRACTION PHACO AND INTRAOCULAR LENS PLACEMENT (IOC);  Surgeon: Gaither Quan, MD;  Location[facility] OR;  Service: Ophthalmology;  Laterality: Right;   COLONOSCOPY W/ BIOPSIES AND POLYPECTOMY     COLONOSCOPY WITH PROPOFOL  N/A 09/07/2014    Procedure: COLONOSCOPY WITH PROPOFOL ;  Surgeon: Renaye Sous, MD;  Location: [facility];  Service: Endoscopy;  Laterality: N/A;   COLONOSCOPY WITH PROPOFOL  N/A 01/10/2020   Procedure: COLONOSCOPY WITH PROPOFOL ;  Surgeon: Sous Renaye, MD;  Location: [facility];  Service: Endoscopy;  Laterality: N/A;   CORONARY ANGIOPLASTY WITH  STENT PLACEMENT  01/15/2017   CORONARY ATHERECTOMY N/A 01/15/2017   Procedure: CORONARY ATHERECTOMY;  Surgeon: Mady Bruckner, MD;  Location: [facility];  Service: Cardiovascular;  Laterality: N/A;   CORONARY BALLOON ANGIOPLASTY N/A 01/15/2017   Procedure: CORONARY BALLOON ANGIOPLASTY;  Surgeon: Mady Bruckner, MD;  Location: [facility];  Service: Cardiovascular;  Laterality: N/A;   CORONARY PRESSURE/FFR STUDY N/A 12/12/2016   Procedure: INTRAVASCULAR PRESSURE WIRE/FFR STUDY;  Surgeon: Mady Bruckner, MD;  Location: [facility];  Service: Cardiovascular;  Laterality: N/A;   CORONARY STENT INTERVENTION N/A 01/15/2017   Procedure: CORONARY STENT INTERVENTION;  Surgeon: Mady Bruckner, MD;  Location: [facility];  Service: Cardiovascular;  Laterality: N/A;   ESOPHAGOGASTRODUODENOSCOPY (EGD) WITH PROPOFOL  N/A 09/07/2014   Procedure: ESOPHAGOGASTRODUODENOSCOPY (EGD) WITH PROPOFOL ;  Surgeon: Renaye Sous, MD;  Location: [facility];  Service: Endoscopy;  Laterality: N/A;   EXTERNAL EAR SURGERY Bilateral 1970s   tumors removed   EYE SURGERY Left 2019   cataract extraction    EYE SURGERY Right 02/09/2018   eyelid surgery    MINI SHUNT INSERTION Right 07/20/2013   Procedure: INSERTION OF GLAUCOMA FILTRATION DEVICE RIGHT EYE;  Surgeon: Gaither Quan, MD;  Location: [facility] OR;  Service: Ophthalmology;  Laterality: Right;   MITOMYCIN  C APPLICATION Right 07/20/2013   Procedure: MITOMYCIN  C APPLICATION;  Surgeon: Gaither Quan, MD;  Location: [facility] OR;  Service: Ophthalmology;  Laterality: Right;   MITOMYCIN  C APPLICATION Right 02/21/2015   Procedure:  MITOMYCIN  C APPLICATION RIGHT EYE;  Surgeon: Gaither Quan, MD;  Location: [facility] OR;  Service: Ophthalmology;  Laterality: Right;   PLACEMENT OF BREAST IMPLANTS Bilateral 1992   took all my breast tissue out; put implants in;fibrocystic breast disease    POLYPECTOMY  01/10/2020   Procedure: POLYPECTOMY;  Surgeon: Sous Renaye, MD;  Location: [facility];  Service: Endoscopy;;   RIGHT/LEFT HEART CATH AND CORONARY ANGIOGRAPHY N/A 12/12/2016   Procedure: RIGHT/LEFT HEART CATH AND CORONARY ANGIOGRAPHY;  Surgeon: Mady Bruckner, MD;  Location: [facility];  Service: Cardiovascular;  Laterality: N/A;   TONSILLECTOMY     TOTAL ABDOMINAL HYSTERECTOMY  1982   TRABECULECTOMY Right 02/21/2015   Procedure: TRABECULECTOMY WITH [facility] ON THE RIGHT EYE;  Surgeon: Gaither Quan, MD;  Location: [facility] OR;  Service: Ophthalmology;  Laterality: Right;    Allergies  Allergen Reactions   Crestor [Rosuvastatin Calcium ] Other (See Comments)    muscle aches   Demerol  [Meperidine Hcl]     Other reaction(s): Hallucinations   Shellfish Allergy Itching and Other (See Comments)    Crab, shrimp and lobster ---lips itch and tingle Was told not to eat again after having a allergy test. Lobster, crab and shrimp     Immunization History  Administered Date(s) Administered   Fluad Quad(high Dose 65+) 12/30/2019, 11/08/2020, 10/21/2021   Influenza Split 10/31/2013   Influenza Whole 12/23/2017   Influenza, High Dose Seasonal PF 11/18/2016, 12/21/2017, 11/23/2018   Influenza,inj,Quad PF,6+ Mos 02/25/2013, 03/19/2015   Influenza,inj,quad, With Preservative 11/11/2018   Influenza-Unspecified 11/11/2022   PFIZER(Purple Top)SARS-COV-2 Vaccination 03/01/2019, 03/22/2019, 11/22/2019   PNEUMOCOCCAL CONJUGATE-20 10/07/2022   Pfizer Covid-19 Vaccine Bivalent Booster 40yrs & up 12/28/2020   Pneumococcal Polysaccharide-23 12/21/2017   Tdap 02/25/2022   Zoster Recombinant(Shingrix ) 12/25/2017, 06/13/2021    Family History   Problem Relation Age of Onset   Heart disease Father    Diabetes Father    Glaucoma Father    Cancer Mother  unknown type, ?lung   Heart disease Paternal Grandmother    Cancer Paternal Grandmother        unknown type   Diabetes Brother    Glaucoma Brother      Current Outpatient Medications:    acetaminophen  (TYLENOL ) 500 MG tablet, Take 1,000 mg by mouth 2 (two) times daily as needed for moderate pain or headache., Disp: , Rfl:    albuterol  (PROVENTIL ) (2.5 MG/3ML) 0.083% nebulizer solution, Take 3 mLs (2.5 mg total) by nebulization daily as needed for wheezing or shortness of breath., Disp: 75 mL, Rfl: 12   albuterol  (VENTOLIN  HFA) 108 (90 Base) MCG/ACT inhaler, Inhale 2 puffs into the lungs every 6 (six) hours as needed for wheezing or shortness of breath., Disp: 18 g, Rfl: 12   ANORO ELLIPTA  62.5-25 MCG/ACT AEPB, INHALE 1 PUFF INTO THE LUNGS DAILY AT 6 AM, Disp: 60 each, Rfl: 3   aspirin  EC 81 MG tablet, Take 81 mg by mouth daily. Swallow whole., Disp: , Rfl:    atorvastatin  (LIPITOR) 80 MG tablet, Take 1 tablet (80 mg total) by mouth daily., Disp: 90 tablet, Rfl: 3   Blood Glucose Monitoring Suppl (ACCU-CHEK AVIVA PLUS) w/Device KIT, Use to check blood sugars 3 times a day. Dx code e11.65, Disp: 1 kit, Rfl: 3   brimonidine  (ALPHAGAN  P) 0.1 % SOLN, Place 1 drop into both eyes in the morning, at noon, and at bedtime., Disp: , Rfl:    brinzolamide  (AZOPT ) 1 % ophthalmic suspension, Place 1 drop into both eyes 3 (three) times daily., Disp: , Rfl:    carvedilol  (COREG ) 25 MG tablet, TAKE 1 TABLET(25 MG) BY MOUTH TWICE DAILY, Disp: 180 tablet, Rfl: 1   clopidogrel  (PLAVIX ) 75 MG tablet, TAKE 1 TABLET(75 MG) BY MOUTH DAILY, Disp: 90 tablet, Rfl: 2   EPINEPHrine  0.3 mg/0.3 mL IJ SOAJ injection, Inject 0.3 mg into the muscle as needed for anaphylaxis., Disp: 1 each, Rfl: 0   ezetimibe  (ZETIA ) 10 MG tablet, Take 1 tablet (10 mg total) by mouth daily., Disp: 90 tablet, Rfl: 1    furosemide  (LASIX ) 40 MG tablet, Take 1 tablet (40 mg total) by mouth daily as needed., Disp: 90 tablet, Rfl: 1   ibuprofen  (ADVIL ) 600 MG tablet, Take 1 tablet (600 mg total) by mouth every 6 (six) hours as needed., Disp: 30 tablet, Rfl: 0   isosorbide  mononitrate (IMDUR ) 60 MG 24 hr tablet, TAKE 1 TABLET(60 MG) BY MOUTH DAILY, Disp: 90 tablet, Rfl: 2   JANUMET  50-500 MG tablet, TAKE 1 TABLET BY MOUTH TWICE DAILY WITH A MEAL, Disp: 90 tablet, Rfl: 1   ketorolac  (ACULAR ) 0.5 % ophthalmic solution, Place 1 drop into the right eye 3 (three) times daily., Disp: , Rfl:    Lancets (ONETOUCH DELICA PLUS LANCET33G) MISC, CHECK BLOOD SUGAR BEFORE BREAKFAST AND DINNER, Disp: 100 each, Rfl: 1   loperamide  (IMODIUM ) 2 MG capsule, Take 1 capsule (2 mg total) by mouth 4 (four) times daily as needed for diarrhea or loose stools., Disp: 12 capsule, Rfl: 0   montelukast  (SINGULAIR ) 10 MG tablet, TAKE 1 TABLET BY MOUTH EVERY DAY, Disp: 90 tablet, Rfl: 1   Nebulizers (COMPRESSOR/NEBULIZER) MISC, Use as directed, Disp: 1 each, Rfl: 0   nitroGLYCERIN  (NITROSTAT ) 0.4 MG SL tablet, Place 1 tablet (0.4 mg total) under the tongue every 5 (five) minutes as needed for chest pain., Disp: 25 tablet, Rfl: prn   ondansetron  (ZOFRAN -ODT) 4 MG disintegrating tablet, Take 1 tablet (4 mg total) by  mouth every 8 (eight) hours as needed for nausea or vomiting., Disp: 20 tablet, Rfl: 0   pantoprazole  (PROTONIX ) 40 MG tablet, Take 1 tablet (40 mg total) by mouth daily., Disp: 90 tablet, Rfl: 2   Polyvinyl Alcohol -Povidone PF (REFRESH) 1.4-0.6 % SOLN, Place 1-2 drops into both eyes 3 (three) times daily as needed (dry/irritated eyes.)., Disp: , Rfl:    potassium chloride  SA (KLOR-CON ) 20 MEQ tablet, Take 1 tablet (20 mEq total) by mouth every other day., Disp: 90 tablet, Rfl: 1   prednisoLONE  acetate (PRED FORTE ) 1 % ophthalmic suspension, Place 2-3 drops into both eyes in the morning, at noon, and at bedtime. Place 2 Drops in Left Eye,  Place 3 Drops In Right Eye TID, Disp: , Rfl:    pregabalin  (LYRICA ) 75 MG capsule, TAKE 1 CAPSULE(75 MG) BY MOUTH TWICE DAILY, Disp: 180 capsule, Rfl: 1   RHOPRESSA  0.02 % SOLN, Place 1 drop into the left eye at bedtime., Disp: , Rfl:    tiZANidine  (ZANAFLEX ) 4 MG tablet, Take 0.5 tablets (2 mg total) by mouth every 6 (six) hours as needed for muscle spasms., Disp: 15 tablet, Rfl: 0   cetirizine  (ZYRTEC  ALLERGY) 10 MG tablet, Take 1 tablet (10 mg total) by mouth daily., Disp: 30 tablet, Rfl: 2      Objective:   Vitals:   02/23/23 1307  BP: (!) 156/88  Pulse: 83  SpO2: 99%  Weight: 162 lb (73.5 kg)  Height: 5' 2 (1.575 m)    Estimated body mass index is 29.63 kg/m as calculated from the following:   Height as of this encounter: 5' 2 (1.575 m).   Weight as of this encounter: 162 lb (73.5 kg).  @WEIGHTCHANGE @  [employer]   02/23/23 1307  Weight: 162 lb (73.5 kg)     Physical Exam   General: No distress. Looksstabl O2 at rest: no Cane present: YES Sitting in wheel chair: no Frail: no Obese: no Neuro: Alert and Oriented x 3. GCS 15. Speech normal Psych: Pleasant Resp:  Barrel Chest - no.  Wheeze - no, Crackles - MILD RIGHT BASEAL CRACK:E, No overt respiratory distress CVS: Normal heart sounds. Murmurs - no Ext: Stigmata of Connective Tissue Disease - no HEENT: Normal upper airway. PEERL +. No post nasal drip        Assessment:       ICD-10-CM   1. ILD (interstitial lung disease) (HCC)  J84.9 Pulmonary function test    Pulse oximetry, overnight    2. Other fatigue  R53.83 Pulmonary function test    3. History of obstructive sleep apnea  Z86.69 Pulmonary function test    Pulse oximetry, overnight         Plan:     Patient Instructions     ICD-10-CM   1. ILD (interstitial lung disease) (HCC)  J84.9     2. Other fatigue  R53.83     3. History of obstructive sleep apnea  Z86.69       Do you recall seeeing Dr Theophilus for Fibrosis (Pulmonary) over  2 years ago? I think scheduling  transfered your care to me The specific variety that Dr. Theophilus diagnosed you with this called IPF You have had it at least since 2018 I am not clear if things are worse or not based on CT scan review   Unclear why you are having fatigue  Plan - await results of blood work done by cardiology  - Please take ILD questionnaire and fill  it at home and bring it back -Regarding your sleep apnea  -Dr. Neysa told me that he could see you for that in 6 months - I will discuss at case conference in the next 1 to 2 months -Do overnight pulse oximetry and if this is abnormal we can try night oxygen  which might help your unexplained fatigue -Do spirometry and DLCO in 3 months -Antifibrotic's indicated for IPF but fatigue is a problem.  Therefore we will evaluate for progression and then make a decision on antifibrotic's.  Follow-up - 3 months 30-minute visit with Dr. Geronimo but after PFT   FOLLOWUP Return in about 3 months (around 05/24/2023) for 30 min visit, after Spiro and DLCO, with Dr Geronimo, Face to Face Visit, ILD.  ( Level 05 visit E&M 2024: Estb >= 40 min  visit type: on-site physical face to visit  in total care time and counseling or/and coordination of care by this undersigned MD - Dr Dorethia Geronimo. This includes one or more of the following on this same day 02/23/2023: pre-charting, chart review, note writing, documentation discussion of test results, diagnostic or treatment recommendations, prognosis, risks and benefits of management options, instructions, education, compliance or risk-factor reduction. It excludes time spent by the CMA or office staff in the care of the patient. Actual time 50 min)   SIGNATURE    Dr. Dorethia Geronimo, M.D., F.C.C.P,  Pulmonary and Critical Care Medicine Staff Physician, [facility] Center Director - Interstitial Lung Disease  Program  Pulmonary Fibrosis [facility] at [facility] [place], [place], [address]  Pager: [phone], If no answer or between  15:00h - 7:00h: call [phone] Telephone: [phone]  5:51 PM 02/23/2023

## 2023-02-23 NOTE — Patient Instructions (Addendum)
 ICD-10-CM   1. ILD (interstitial lung disease) (HCC)  J84.9     2. Other fatigue  R53.83     3. History of obstructive sleep apnea  Z86.69       Do you recall seeeing Dr Theophilus for Fibrosis (Pulmonary) over 2 years ago? 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 Neysa told me that he could see you for that in 6 months - I will discuss at case conference in the next 1 to 2 months -Do overnight pulse oximetry and if this is abnormal we can try night oxygen  which might help your unexplained fatigue -Do spirometry and DLCO in 3 months -Antifibrotic's indicated for IPF but fatigue is a problem.  Therefore we will evaluate for progression and then make a decision on antifibrotic's.  Follow-up - 3 months 30-minute visit with Dr. Geronimo but after PFT

## 2023-02-24 ENCOUNTER — Telehealth (HOSPITAL_BASED_OUTPATIENT_CLINIC_OR_DEPARTMENT_OTHER): Payer: Self-pay

## 2023-02-24 ENCOUNTER — Ambulatory Visit: Payer: Medicare Other | Admitting: Internal Medicine

## 2023-02-24 DIAGNOSIS — E785 Hyperlipidemia, unspecified: Secondary | ICD-10-CM

## 2023-02-24 LAB — CBC
Hematocrit: 36.8 % (ref 34.0–46.6)
Hemoglobin: 11.9 g/dL (ref 11.1–15.9)
MCH: 28.3 pg (ref 26.6–33.0)
MCHC: 32.3 g/dL (ref 31.5–35.7)
MCV: 88 fL (ref 79–97)
Platelets: 247 10*3/uL (ref 150–450)
RBC: 4.2 x10E6/uL (ref 3.77–5.28)
RDW: 13.6 % (ref 11.7–15.4)
WBC: 8.1 10*3/uL (ref 3.4–10.8)

## 2023-02-24 LAB — COMPREHENSIVE METABOLIC PANEL
ALT: 9 [IU]/L (ref 0–32)
AST: 14 [IU]/L (ref 0–40)
Albumin: 4.4 g/dL (ref 3.8–4.8)
Alkaline Phosphatase: 64 [IU]/L (ref 44–121)
BUN/Creatinine Ratio: 18 (ref 12–28)
BUN: 15 mg/dL (ref 8–27)
Bilirubin Total: 0.2 mg/dL (ref 0.0–1.2)
CO2: 22 mmol/L (ref 20–29)
Calcium: 9.5 mg/dL (ref 8.7–10.3)
Chloride: 101 mmol/L (ref 96–106)
Creatinine, Ser: 0.82 mg/dL (ref 0.57–1.00)
Globulin, Total: 2.2 g/dL (ref 1.5–4.5)
Glucose: 113 mg/dL — ABNORMAL HIGH (ref 70–99)
Potassium: 4.8 mmol/L (ref 3.5–5.2)
Sodium: 138 mmol/L (ref 134–144)
Total Protein: 6.6 g/dL (ref 6.0–8.5)
eGFR: 75 mL/min/{1.73_m2} (ref >59)

## 2023-02-24 LAB — THYROID PANEL WITH TSH
Free Thyroxine Index: 2.8 (ref 1.2–4.9)
T3 Uptake Ratio: 32 % (ref 24–39)
T4, Total: 8.9 ug/dL (ref 4.5–12.0)
TSH: 1.36 u[IU]/mL (ref 0.450–4.500)

## 2023-02-24 LAB — LIPID PANEL
Chol/HDL Ratio: 3.9 {ratio} (ref 0.0–4.4)
Cholesterol, Total: 180 mg/dL (ref 100–199)
HDL: 46 mg/dL (ref >39)
LDL Chol Calc (NIH): 112 mg/dL — ABNORMAL HIGH (ref 0–99)
Triglycerides: 125 mg/dL (ref 0–149)
VLDL Cholesterol Cal: 22 mg/dL (ref 5–40)

## 2023-02-24 MED ORDER — ATORVASTATIN CALCIUM 80 MG PO TABS: 80.0000 mg | ORAL_TABLET | Freq: Every day | ORAL | 3 refills | Status: AC

## 2023-02-24 NOTE — Telephone Encounter (Signed)
-----   Message from Reche GORMAN Finder sent at 02/24/2023  8:21 AM EST ----- Normal kidneys, liver, electrolytes, thyroid .  CBC no anemia nor infection.  LDL (bad cholesterol) 112 which is above goal of 70 and much increased from prior reading of 69.  Recommend continue Zetia  10 mg daily and resume atorvastatin  80 mg daily.  Repeat FLP/LFT in 3 months.

## 2023-02-24 NOTE — Telephone Encounter (Signed)
 Called and spoke to patient. Discussed results and NP's recommendations. Pt is in agreement. Lab slips mailed to pt and refill sent to preferred pharmacy. Pt verbalized understanding and was thankful for call.  ----- Message from Reche GORMAN Finder sent at 02/24/2023  8:21 AM EST ----- Normal kidneys, liver, electrolytes, thyroid .  CBC no anemia nor infection.  LDL (bad cholesterol) 112 which is above goal of 70 and much increased from prior reading of 69.  Recommend continue Zetia  10 mg daily and resume atorvastatin  80 mg daily.  Repeat FLP/LFT in 3 months.

## 2023-03-03 DIAGNOSIS — S92512D Displaced fracture of proximal phalanx of left lesser toe(s), subsequent encounter for fracture with routine healing: Secondary | ICD-10-CM | POA: Diagnosis not present

## 2023-03-03 DIAGNOSIS — I739 Peripheral vascular disease, unspecified: Secondary | ICD-10-CM | POA: Diagnosis not present

## 2023-03-03 DIAGNOSIS — M2011 Hallux valgus (acquired), right foot: Secondary | ICD-10-CM | POA: Diagnosis not present

## 2023-03-03 DIAGNOSIS — E1151 Type 2 diabetes mellitus with diabetic peripheral angiopathy without gangrene: Secondary | ICD-10-CM | POA: Diagnosis not present

## 2023-03-03 DIAGNOSIS — S92515A Nondisplaced fracture of proximal phalanx of left lesser toe(s), initial encounter for closed fracture: Secondary | ICD-10-CM | POA: Diagnosis not present

## 2023-03-03 DIAGNOSIS — L851 Acquired keratosis [keratoderma] palmaris et plantaris: Secondary | ICD-10-CM | POA: Diagnosis not present

## 2023-03-03 DIAGNOSIS — Z9181 History of falling: Secondary | ICD-10-CM | POA: Diagnosis not present

## 2023-03-03 DIAGNOSIS — B351 Tinea unguium: Secondary | ICD-10-CM | POA: Diagnosis not present

## 2023-03-03 DIAGNOSIS — M2012 Hallux valgus (acquired), left foot: Secondary | ICD-10-CM | POA: Diagnosis not present

## 2023-03-03 DIAGNOSIS — R262 Difficulty in walking, not elsewhere classified: Secondary | ICD-10-CM | POA: Diagnosis not present

## 2023-03-05 ENCOUNTER — Telehealth: Payer: Self-pay | Admitting: Cardiovascular Disease

## 2023-03-05 NOTE — Telephone Encounter (Signed)
Called and spoke to pt; discussed monitor falling off and to mail zio monitor to review info and if it isn't enough, company will mail another monitor to her. She verbalized understanding and will mail monitor out to tomorrow.

## 2023-03-05 NOTE — Telephone Encounter (Signed)
Patient heart monitor came off last night. Calling to see what she needs to do about it. Please advise

## 2023-03-10 ENCOUNTER — Encounter (HOSPITAL_BASED_OUTPATIENT_CLINIC_OR_DEPARTMENT_OTHER): Payer: Self-pay

## 2023-03-10 DIAGNOSIS — E785 Hyperlipidemia, unspecified: Secondary | ICD-10-CM | POA: Diagnosis not present

## 2023-03-10 DIAGNOSIS — Z87898 Personal history of other specified conditions: Secondary | ICD-10-CM | POA: Diagnosis not present

## 2023-03-12 ENCOUNTER — Ambulatory Visit: Payer: Medicare Other | Admitting: Adult Health

## 2023-03-16 DIAGNOSIS — H401113 Primary open-angle glaucoma, right eye, severe stage: Secondary | ICD-10-CM | POA: Diagnosis not present

## 2023-03-16 DIAGNOSIS — H401122 Primary open-angle glaucoma, left eye, moderate stage: Secondary | ICD-10-CM | POA: Diagnosis not present

## 2023-03-31 DIAGNOSIS — H2013 Chronic iridocyclitis, bilateral: Secondary | ICD-10-CM | POA: Diagnosis not present

## 2023-03-31 DIAGNOSIS — H3581 Retinal edema: Secondary | ICD-10-CM | POA: Diagnosis not present

## 2023-03-31 DIAGNOSIS — M2011 Hallux valgus (acquired), right foot: Secondary | ICD-10-CM | POA: Diagnosis not present

## 2023-03-31 DIAGNOSIS — L851 Acquired keratosis [keratoderma] palmaris et plantaris: Secondary | ICD-10-CM | POA: Diagnosis not present

## 2023-03-31 DIAGNOSIS — B351 Tinea unguium: Secondary | ICD-10-CM | POA: Diagnosis not present

## 2023-03-31 DIAGNOSIS — E1151 Type 2 diabetes mellitus with diabetic peripheral angiopathy without gangrene: Secondary | ICD-10-CM | POA: Diagnosis not present

## 2023-03-31 DIAGNOSIS — Z9181 History of falling: Secondary | ICD-10-CM | POA: Diagnosis not present

## 2023-03-31 DIAGNOSIS — H15012 Anterior scleritis, left eye: Secondary | ICD-10-CM | POA: Diagnosis not present

## 2023-03-31 DIAGNOSIS — M2012 Hallux valgus (acquired), left foot: Secondary | ICD-10-CM | POA: Diagnosis not present

## 2023-03-31 DIAGNOSIS — R262 Difficulty in walking, not elsewhere classified: Secondary | ICD-10-CM | POA: Diagnosis not present

## 2023-03-31 DIAGNOSIS — I70203 Unspecified atherosclerosis of native arteries of extremities, bilateral legs: Secondary | ICD-10-CM | POA: Diagnosis not present

## 2023-03-31 DIAGNOSIS — I739 Peripheral vascular disease, unspecified: Secondary | ICD-10-CM | POA: Diagnosis not present

## 2023-04-10 ENCOUNTER — Encounter (HOSPITAL_COMMUNITY): Payer: Self-pay

## 2023-04-10 ENCOUNTER — Other Ambulatory Visit: Payer: Self-pay

## 2023-04-10 ENCOUNTER — Emergency Department (HOSPITAL_COMMUNITY)
Admission: EM | Admit: 2023-04-10 | Discharge: 2023-04-11 | Disposition: A | Discharge status: Home / Self Care | Payer: Medicare Other | Attending: Emergency Medicine | Admitting: Emergency Medicine

## 2023-04-10 DIAGNOSIS — G4489 Other headache syndrome: Secondary | ICD-10-CM | POA: Diagnosis not present

## 2023-04-10 DIAGNOSIS — R42 Dizziness and giddiness: Secondary | ICD-10-CM | POA: Diagnosis not present

## 2023-04-10 DIAGNOSIS — E860 Dehydration: Secondary | ICD-10-CM | POA: Diagnosis not present

## 2023-04-10 DIAGNOSIS — R197 Diarrhea, unspecified: Secondary | ICD-10-CM | POA: Diagnosis not present

## 2023-04-10 DIAGNOSIS — Z7982 Long term (current) use of aspirin: Secondary | ICD-10-CM | POA: Diagnosis not present

## 2023-04-10 DIAGNOSIS — R55 Syncope and collapse: Secondary | ICD-10-CM | POA: Diagnosis not present

## 2023-04-10 DIAGNOSIS — R110 Nausea: Secondary | ICD-10-CM | POA: Diagnosis not present

## 2023-04-10 DIAGNOSIS — R1084 Generalized abdominal pain: Secondary | ICD-10-CM | POA: Diagnosis not present

## 2023-04-10 LAB — CBC
HCT: 36.3 % (ref 36.0–46.0)
Hemoglobin: 11.9 g/dL — ABNORMAL LOW (ref 12.0–15.0)
MCH: 28.1 pg (ref 26.0–34.0)
MCHC: 32.8 g/dL (ref 30.0–36.0)
MCV: 85.8 fL (ref 80.0–100.0)
Platelets: 141 10*3/uL — ABNORMAL LOW (ref 150–400)
RBC: 4.23 MIL/uL (ref 3.87–5.11)
RDW: 14.4 % (ref 11.5–15.5)
WBC: 7.6 10*3/uL (ref 4.0–10.5)
nRBC: 0 % (ref 0.0–0.2)

## 2023-04-10 LAB — CBG MONITORING, ED: Glucose-Capillary: 94 mg/dL (ref 70–99)

## 2023-04-10 NOTE — ED Triage Notes (Signed)
 Pt is coming in with medic from home, with near syncope complaints. She was using the bathroom tonight, she was pooping and had a vasovagal syncope. Everyone in the house has been sick as well as her with nausea and diarrhea. This has been going on for a few days now. She has no other complaints at this time.    Medic vitals   147bgl 145/80 84hr 98%ra 18rr  22g left AC

## 2023-04-10 NOTE — ED Notes (Signed)
 Pt unable to urinate at this time.

## 2023-04-11 DIAGNOSIS — R55 Syncope and collapse: Secondary | ICD-10-CM | POA: Diagnosis not present

## 2023-04-11 LAB — RESP PANEL BY RT-PCR (RSV, FLU A&B, COVID)  RVPGX2
Influenza A by PCR: NEGATIVE
Influenza B by PCR: NEGATIVE
Resp Syncytial Virus by PCR: NEGATIVE
SARS Coronavirus 2 by RT PCR: NEGATIVE

## 2023-04-11 LAB — BASIC METABOLIC PANEL
Anion gap: 12 (ref 5–15)
BUN: 20 mg/dL (ref 8–23)
CO2: 24 mmol/L (ref 22–32)
Calcium: 9 mg/dL (ref 8.9–10.3)
Chloride: 100 mmol/L (ref 98–111)
Creatinine, Ser: 1.02 mg/dL — ABNORMAL HIGH (ref 0.44–1.00)
GFR, Estimated: 57 mL/min — ABNORMAL LOW (ref >60)
Glucose, Bld: 109 mg/dL — ABNORMAL HIGH (ref 70–99)
Potassium: 3.5 mmol/L (ref 3.5–5.1)
Sodium: 136 mmol/L (ref 135–145)

## 2023-04-11 MED ORDER — SODIUM CHLORIDE 0.9 % IV BOLUS
1000.0000 mL | Freq: Once | INTRAVENOUS | Status: AC
  Administered 2023-04-11: 1000 mL via INTRAVENOUS

## 2023-04-11 NOTE — ED Provider Notes (Signed)
 [place] EMERGENCY DEPARTMENT AT [facility] Provider Note   CSN: [id number] Arrival date & time: 04/10/23  2257     History  Chief Complaint  Patient presents with   Near Syncope    Sheila Reynolds is a 78 y.o. female.  Patient presents to the emergency department for near syncope.  She has had similar episodes in the past.  Patient does report that she has gotten a stomach bug and has had diarrhea for a couple of days.  Multiple family members with similar symptoms.  No abdominal pain.  Patient reports that she had a diarrhea stool, stood up from the toilet, felt weak and dizzy like she was going to pass out.       Home Medications Prior to Admission medications   Medication Sig Start Date End Date Taking? Authorizing Provider  acetaminophen (TYLENOL) 500 MG tablet Take 1,000 mg by mouth 2 (two) times daily as needed for moderate pain or headache.    [provider]  albuterol (PROVENTIL) (2.5 MG/3ML) 0.083% nebulizer solution Take 3 mLs (2.5 mg total) by nebulization daily as needed for wheezing or shortness of breath. 04/01/21   Arnette Felts, FNP  albuterol (VENTOLIN HFA) 108 (90 Base) MCG/ACT inhaler Inhale 2 puffs into the lungs every 6 (six) hours as needed for wheezing or shortness of breath. 12/30/19   Young, Joni Fears D, MD  ANORO ELLIPTA 62.5-25 MCG/ACT AEPB INHALE 1 PUFF INTO THE LUNGS DAILY AT 6 AM 02/21/22   [name], Colbert Coyer, MD  aspirin EC 81 MG tablet Take 81 mg by mouth daily. Swallow whole.    [provider]  atorvastatin (LIPITOR) 80 MG tablet Take 1 tablet (80 mg total) by mouth daily. 02/24/23   Alver Sorrow, NP  Blood Glucose Monitoring Suppl (ACCU-CHEK AVIVA PLUS) w/Device KIT Use to check blood sugars 3 times a day. Dx code e11.65 01/03/20   Dorothyann Peng, MD  brimonidine (ALPHAGAN P) 0.1 % SOLN Place 1 drop into both eyes in the morning, at noon, and at bedtime.    [provider]  brinzolamide (AZOPT) 1 %  ophthalmic suspension Place 1 drop into both eyes 3 (three) times daily.    [provider]  carvedilol (COREG) 25 MG tablet TAKE 1 TABLET(25 MG) BY MOUTH TWICE DAILY 02/16/23   [name], Deloris Ping, MD  cetirizine (ZYRTEC ALLERGY) 10 MG tablet Take 1 tablet (10 mg total) by mouth daily. 10/18/21 02/16/23  Dorothyann Peng, MD  clopidogrel (PLAVIX) 75 MG tablet TAKE 1 TABLET(75 MG) BY MOUTH DAILY 10/09/22   [name], Deloris Ping, MD  EPINEPHrine 0.3 mg/0.3 mL IJ SOAJ injection Inject 0.3 mg into the muscle as needed for anaphylaxis. 06/05/22   Dorothyann Peng, MD  ezetimibe (ZETIA) 10 MG tablet Take 1 tablet (10 mg total) by mouth daily. 01/01/23   [name], Deloris Ping, MD  furosemide (LASIX) 40 MG tablet Take 1 tablet (40 mg total) by mouth daily as needed. 03/28/22   [name], Deloris Ping, MD  ibuprofen (ADVIL) 600 MG tablet Take 1 tablet (600 mg total) by mouth every 6 (six) hours as needed. 01/01/23   Piontek, Denny Peon, MD  isosorbide mononitrate (IMDUR) 60 MG 24 hr tablet TAKE 1 TABLET(60 MG) BY MOUTH DAILY 09/16/22   [name], Deloris Ping, MD  JANUMET 50-500 MG tablet TAKE 1 TABLET BY MOUTH TWICE DAILY WITH A MEAL 02/12/23   Dorothyann Peng, MD  ketorolac (ACULAR) 0.5 % ophthalmic solution Place 1 drop into the right eye 3 (  three) times daily. 08/02/20   [provider]  Lancets (ONETOUCH DELICA PLUS LANCET33G) MISC CHECK BLOOD SUGAR BEFORE BREAKFAST AND DINNER 09/12/20   Charlesetta Ivory, NP  loperamide (IMODIUM) 2 MG capsule Take 1 capsule (2 mg total) by mouth 4 (four) times daily as needed for diarrhea or loose stools. 11/07/22   Sabas Sous, MD  montelukast (SINGULAIR) 10 MG tablet TAKE 1 TABLET BY MOUTH EVERY DAY 02/17/22   Dorothyann Peng, MD  Nebulizers (COMPRESSOR/NEBULIZER) MISC Use as directed 04/22/18   Waymon Budge, MD  nitroGLYCERIN (NITROSTAT) 0.4 MG SL tablet Place 1 tablet (0.4 mg total) under the tongue every 5 (five) minutes as needed for chest pain. 07/25/19 09/21/23  Dorothyann Peng, MD  ondansetron  (ZOFRAN-ODT) 4 MG disintegrating tablet Take 1 tablet (4 mg total) by mouth every 8 (eight) hours as needed for nausea or vomiting. 12/06/22   Rai, Ripudeep K, MD  pantoprazole (PROTONIX) 40 MG tablet Take 1 tablet (40 mg total) by mouth daily. 09/19/22   [name], Deloris Ping, MD  Polyvinyl Alcohol-Povidone PF (REFRESH) 1.4-0.6 % SOLN Place 1-2 drops into both eyes 3 (three) times daily as needed (dry/irritated eyes.).    [provider]  potassium chloride SA (KLOR-CON) 20 MEQ tablet Take 1 tablet (20 mEq total) by mouth every other day. 06/16/19   [name], Deloris Ping, MD  prednisoLONE acetate (PRED FORTE) 1 % ophthalmic suspension Place 2-3 drops into both eyes in the morning, at noon, and at bedtime. Place 2 Drops in Left Eye, Place 3 Drops In Right Eye TID 10/11/20   [provider]  pregabalin (LYRICA) 75 MG capsule TAKE 1 CAPSULE(75 MG) BY MOUTH TWICE DAILY 02/12/23   Dorothyann Peng, MD  RHOPRESSA 0.02 % SOLN Place 1 drop into the left eye at bedtime. 05/27/21   [provider]  tiZANidine (ZANAFLEX) 4 MG tablet Take 0.5 tablets (2 mg total) by mouth every 6 (six) hours as needed for muscle spasms. 01/01/23   Jannifer Franklin, MD      Allergies    Crestor [rosuvastatin calcium], Demerol  [meperidine hcl], and Shellfish allergy    Review of Systems   Review of Systems  Physical Exam Updated Vital Signs BP (!) 154/90 (BP Location: Left Arm)   Pulse 77   Temp 98.7 F (37.1 C) (Oral)   Resp 15   SpO2 99%  Physical Exam Vitals and nursing note reviewed.  Constitutional:      General: She is not in acute distress.    Appearance: She is well-developed.  HENT:     Head: Normocephalic and atraumatic.     Mouth/Throat:     Mouth: Mucous membranes are moist.  Eyes:     General: Vision grossly intact. Gaze aligned appropriately.     Extraocular Movements: Extraocular movements intact.     Conjunctiva/sclera: Conjunctivae normal.  Cardiovascular:     Rate and Rhythm: Normal  rate and regular rhythm.     Pulses: Normal pulses.     Heart sounds: Normal heart sounds, S1 normal and S2 normal. No murmur heard.    No friction rub. No gallop.  Pulmonary:     Effort: Pulmonary effort is normal. No respiratory distress.     Breath sounds: Normal breath sounds.  Abdominal:     General: Bowel sounds are normal.     Palpations: Abdomen is soft.     Tenderness: There is no abdominal tenderness. There is no guarding or rebound.     Hernia:  No hernia is present.  Musculoskeletal:        General: No swelling.     Cervical back: Full passive range of motion without pain, normal range of motion and neck supple. No spinous process tenderness or muscular tenderness. Normal range of motion.     Right lower leg: No edema.     Left lower leg: No edema.  Skin:    General: Skin is warm and dry.     Capillary Refill: Capillary refill takes less than 2 seconds.     Findings: No ecchymosis, erythema, rash or wound.  Neurological:     General: No focal deficit present.     Mental Status: She is alert and oriented to person, place, and time.     GCS: GCS eye subscore is 4. GCS verbal subscore is 5. GCS motor subscore is 6.     Cranial Nerves: Cranial nerves 2-12 are intact.     Sensory: Sensation is intact.     Motor: Motor function is intact.     Coordination: Coordination is intact.  Psychiatric:        Attention and Perception: Attention normal.        Mood and Affect: Mood normal.        Speech: Speech normal.        Behavior: Behavior normal.     ED Results / Procedures / Treatments   Labs (all labs ordered are listed, but only abnormal results are displayed) Labs Reviewed  BASIC METABOLIC PANEL - Abnormal; Notable for the following components:      Result Value   Glucose, Bld 109 (*)    Creatinine, Ser 1.02 (*)    GFR, Estimated 57 (*)    All other components within normal limits  CBC - Abnormal; Notable for the following components:   Hemoglobin 11.9 (*)     Platelets 141 (*)    All other components within normal limits  RESP PANEL BY RT-PCR (RSV, FLU A&B, COVID)  [id number]  URINALYSIS, ROUTINE W REFLEX MICROSCOPIC  CBG MONITORING, ED    EKG None  Radiology No results found.  Procedures Procedures    Medications Ordered in ED Medications  sodium chloride 0.9 % bolus 1,000 mL (1,000 mLs Intravenous New Bag/Given 04/11/23 0446)    ED Course/ Medical Decision Making/ A&P                                 Medical Decision Making Amount and/or Complexity of Data Reviewed Labs: ordered.   Patient with history of vasovagal syncope/presyncope presents to the emergency department after near syncopal event.  This occurred after she had had multiple episodes of diarrhea.  Patient reports multiple family members with similar infectious diarrhea.    Patient had had diarrhea, stood up from the toilet and felt faint like she was going to pass out.  She did not fall or completely pass out.  No associated chest pain or shortness of breath.  Abdominal exam is benign at arrival.  Vital signs have remained normal here in the ED.  Workup unremarkable.  Patient given IV fluids, will discharge with symptomatic treatment for diarrhea.        Final Clinical Impression(s) / ED Diagnoses Final diagnoses:  Near syncope  Diarrhea, unspecified type  Dehydration    Rx / DC Orders ED Discharge Orders     None         Dayleen Beske, Cristal Deer  J, MD 04/11/23 1610

## 2023-04-11 NOTE — Discharge Instructions (Addendum)
 Use Imodium if needed for any further episodes of diarrhea.

## 2023-04-13 ENCOUNTER — Telehealth: Payer: Self-pay

## 2023-04-13 NOTE — Transitions of Care (Post Inpatient/ED Visit) (Signed)
 04/13/2023  Name: Sheila Reynolds MRN: [id number] DOB: 1946-01-27  Today's TOC FU Call Status: Today's TOC FU Call Status:: Successful TOC FU Call Completed TOC FU Call Complete Date: 04/13/23 Patient's Name and Date of Birth confirmed.  Transition Care Management Follow-up Telephone Call Date of Discharge: 04/10/23 Discharge Facility: [facility]) Type of Discharge: Emergency Department Reason for ED Visit: Other: How have you been since you were released from the hospital?: Better (Patient feels better but feels weak) Any questions or concerns?: Yes Patient Questions/Concerns:: patient is concerned about having a UTI. Patient Questions/Concerns Addressed: Notified Provider of Patient Questions/Concerns  Items Reviewed: Did you receive and understand the discharge instructions provided?: No Medications obtained,verified, and reconciled?: Yes (Medications Reviewed) Any new allergies since your discharge?: No Dietary orders reviewed?: NA Do you have support at home?: Yes People in Home: spouse, grandchild(ren)  Medications Reviewed Today: Medications Reviewed Today     Reviewed by Argentina Ponder, CMA (Certified Medical Assistant) on 04/13/23 at 1550  Med List Status: <None>   Medication Order Taking? Sig Documenting Provider Last Dose Status Informant  acetaminophen (TYLENOL) 500 MG tablet [id number] Yes Take 1,000 mg by mouth 2 (two) times daily as needed for moderate pain or headache. [provider] Taking Active Self  albuterol (PROVENTIL) (2.5 MG/3ML) 0.083% nebulizer solution [id number] Yes Take 3 mLs (2.5 mg total) by nebulization daily as needed for wheezing or shortness of breath. Arnette Felts, FNP Taking Active Self  albuterol (VENTOLIN HFA) 108 (90 Base) MCG/ACT inhaler [id number] Yes Inhale 2 puffs into the lungs every 6 (six) hours as needed for wheezing or shortness of breath. Waymon Budge, MD Taking Active Self  ANORO ELLIPTA 62.5-25 MCG/ACT  AEPB [id number] Yes INHALE 1 PUFF INTO THE LUNGS DAILY AT 6 AM [name], Praveen, MD Taking Active Self  aspirin EC 81 MG tablet [id number] Yes Take 81 mg by mouth daily. Swallow whole. [provider] Taking Active Self           Med Note [facility] MENDEZ, CARLOS A   Thu Apr 19, 2020 11:14 AM)    atorvastatin (LIPITOR) 80 MG tablet [id number] Yes Take 1 tablet (80 mg total) by mouth daily. Alver Sorrow, NP Taking Active   Blood Glucose Monitoring Suppl (ACCU-CHEK AVIVA PLUS) w/Device KIT [id number] Yes Use to check blood sugars 3 times a day. Dx code e11.65 Dorothyann Peng, MD Taking Active Self  brimonidine (ALPHAGAN P) 0.1 % SOLN [id number] Yes Place 1 drop into both eyes in the morning, at noon, and at bedtime. [provider] Taking Active Self  brinzolamide (AZOPT) 1 % ophthalmic suspension [id number] Yes Place 1 drop into both eyes 3 (three) times daily. [provider] Taking Active Self  carvedilol (COREG) 25 MG tablet [id number] Yes TAKE 1 TABLET(25 MG) BY MOUTH TWICE DAILY [name], Deloris Ping, MD Taking Active   cetirizine (ZYRTEC ALLERGY) 10 MG tablet [id number]  Take 1 tablet (10 mg total) by mouth daily. Dorothyann Peng, MD  Expired 02/16/23 2359 Self  clopidogrel (PLAVIX) 75 MG tablet [id number] Yes TAKE 1 TABLET(75 MG) BY MOUTH DAILY [name], Deloris Ping, MD Taking Active Self  EPINEPHrine 0.3 mg/0.3 mL IJ SOAJ injection [id number] Yes Inject 0.3 mg into the muscle as needed for anaphylaxis. Dorothyann Peng, MD Taking Active Self           Med Note Merlene Morse, Kathreen Cosier   Wed Dec 03, 2022 10:57 AM) Not used but still has  ezetimibe (ZETIA) 10 MG  tablet [id number] Yes Take 1 tablet (10 mg total) by mouth daily. [name], Deloris Ping, MD Taking Active   furosemide (LASIX) 40 MG tablet [id number] Yes Take 1 tablet (40 mg total) by mouth daily as needed. [name], Deloris Ping, MD Taking Active Self           Med Note Penni Bombard   Wed Dec 03, 2022 10:58 AM) Pt takes every 3rd day    ibuprofen (ADVIL) 600 MG tablet [id number] Yes Take 1 tablet (600 mg total) by mouth every 6 (six) hours as needed. Jannifer Franklin, MD Taking Active   isosorbide mononitrate (IMDUR) 60 MG 24 hr tablet [id number] Yes TAKE 1 TABLET(60 MG) BY MOUTH DAILY [name], Deloris Ping, MD Taking Active Self  JANUMET 50-500 MG tablet [id number] Yes TAKE 1 TABLET BY MOUTH TWICE DAILY WITH A MEAL Dorothyann Peng, MD Taking Active   ketorolac (ACULAR) 0.5 % ophthalmic solution [id number] Yes Place 1 drop into the right eye 3 (three) times daily. [provider] Taking Active Self  Lancets Letta Pate DELICA PLUS [place]) MISC [id number] Yes CHECK BLOOD SUGAR BEFORE BREAKFAST AND Marcelle Overlie, NP Taking Active Self  loperamide (IMODIUM) 2 MG capsule [id number] Yes Take 1 capsule (2 mg total) by mouth 4 (four) times daily as needed for diarrhea or loose stools. Sabas Sous, MD Taking Active Self  montelukast (SINGULAIR) 10 MG tablet [id number] Yes TAKE 1 TABLET BY MOUTH EVERY DAY Dorothyann Peng, MD Taking Active Self  Nebulizers (COMPRESSOR/NEBULIZER) MISC [id number] Yes Use as directed Waymon Budge, MD Taking Active Self  nitroGLYCERIN (NITROSTAT) 0.4 MG SL tablet [id number] Yes Place 1 tablet (0.4 mg total) under the tongue every 5 (five) minutes as needed for chest pain. Dorothyann Peng, MD Taking Active Self           Med Note Merlene Morse, Kathreen Cosier   Wed Dec 03, 2022 11:02 AM) Has on hand  ondansetron (ZOFRAN-ODT) 4 MG disintegrating tablet [id number] Yes Take 1 tablet (4 mg total) by mouth every 8 (eight) hours as needed for nausea or vomiting. Rai, Delene Ruffini, MD Taking Active   pantoprazole (PROTONIX) 40 MG tablet [id number] Yes Take 1 tablet (40 mg total) by mouth daily. [name], Deloris Ping, MD Taking Active Self  Polyvinyl Alcohol-Povidone PF (REFRESH) 1.4-0.6 % SOLN [id number] Yes Place 1-2 drops into both eyes 3 (three) times daily as needed (dry/irritated eyes.). [provider] Taking  Active Self           Med Note Merlene Morse, Kathreen Cosier   Wed Dec 03, 2022 11:02 AM) prn  potassium chloride SA (KLOR-CON) 20 MEQ tablet [id number] Yes Take 1 tablet (20 mEq total) by mouth every other day. [name], Deloris Ping, MD Taking Active Self           Med Note Merlene Morse, Kathreen Cosier   Wed Dec 03, 2022 11:03 AM) Prn with furosemide  prednisoLONE acetate (PRED FORTE) 1 % ophthalmic suspension [id number] Yes Place 2-3 drops into both eyes in the morning, at noon, and at bedtime. Place 2 Drops in Left Eye, Place 3 Drops In Right Eye TID [provider] Taking Active Self  pregabalin (LYRICA) 75 MG capsule [id number] Yes TAKE 1 CAPSULE(75 MG) BY MOUTH TWICE DAILY Dorothyann Peng, MD Taking Active   RHOPRESSA 0.02 % SOLN [id number] Yes Place 1 drop into the left eye at bedtime. [provider] Taking Active Self  tiZANidine (ZANAFLEX) 4 MG tablet [id number] Yes Take 0.5 tablets (2 mg total) by mouth  every 6 (six) hours as needed for muscle spasms. Jannifer Franklin, MD Taking Active             Home Care and Equipment/Supplies: Were Home Health Services Ordered?: NA Any new equipment or medical supplies ordered?: NA  Functional Questionnaire: Do you need assistance with bathing/showering or dressing?: No Do you need assistance with meal preparation?: No Do you need assistance with eating?: No Do you have difficulty maintaining continence: No Do you need assistance with getting out of bed/getting out of a chair/moving?: No Do you have difficulty managing or taking your medications?: No  Follow up appointments reviewed: PCP Follow-up appointment confirmed?: Yes Date of PCP follow-up appointment?: 04/14/23 Follow-up Provider: Dorothyann Peng MD Specialist Hospital Follow-up appointment confirmed?: NA Do you need transportation to your follow-up appointment?: No Do you understand care options if your condition(s) worsen?: Yes-patient verbalized understanding    SIGNATUREYL,RMA

## 2023-04-14 ENCOUNTER — Ambulatory Visit: Payer: Medicare Other | Admitting: Internal Medicine

## 2023-04-15 ENCOUNTER — Encounter: Payer: Self-pay | Admitting: Internal Medicine

## 2023-04-15 ENCOUNTER — Ambulatory Visit: Admitting: Internal Medicine

## 2023-04-15 VITALS — BP 122/62 | HR 80 | Temp 98.9°F | Ht 62.0 in | Wt 167.6 lb

## 2023-04-15 DIAGNOSIS — E66811 Obesity, class 1: Secondary | ICD-10-CM | POA: Diagnosis not present

## 2023-04-15 DIAGNOSIS — E6609 Other obesity due to excess calories: Secondary | ICD-10-CM

## 2023-04-15 DIAGNOSIS — J984 Other disorders of lung: Secondary | ICD-10-CM | POA: Diagnosis not present

## 2023-04-15 DIAGNOSIS — I110 Hypertensive heart disease with heart failure: Secondary | ICD-10-CM

## 2023-04-15 DIAGNOSIS — F321 Major depressive disorder, single episode, moderate: Secondary | ICD-10-CM

## 2023-04-15 DIAGNOSIS — E785 Hyperlipidemia, unspecified: Secondary | ICD-10-CM | POA: Diagnosis not present

## 2023-04-15 DIAGNOSIS — R55 Syncope and collapse: Secondary | ICD-10-CM

## 2023-04-15 DIAGNOSIS — F325 Major depressive disorder, single episode, in full remission: Secondary | ICD-10-CM | POA: Diagnosis not present

## 2023-04-15 DIAGNOSIS — Z6830 Body mass index (BMI) 30.0-30.9, adult: Secondary | ICD-10-CM

## 2023-04-15 DIAGNOSIS — M791 Myalgia, unspecified site: Secondary | ICD-10-CM | POA: Diagnosis not present

## 2023-04-15 DIAGNOSIS — I5032 Chronic diastolic (congestive) heart failure: Secondary | ICD-10-CM | POA: Diagnosis not present

## 2023-04-15 DIAGNOSIS — A084 Viral intestinal infection, unspecified: Secondary | ICD-10-CM | POA: Diagnosis not present

## 2023-04-15 DIAGNOSIS — E1169 Type 2 diabetes mellitus with other specified complication: Secondary | ICD-10-CM

## 2023-04-15 DIAGNOSIS — I700 Atherosclerosis of aorta: Secondary | ICD-10-CM | POA: Diagnosis not present

## 2023-04-15 DIAGNOSIS — R82998 Other abnormal findings in urine: Secondary | ICD-10-CM | POA: Diagnosis not present

## 2023-04-15 LAB — POCT URINALYSIS DIPSTICK
Glucose, UA: NEGATIVE
Nitrite, UA: POSITIVE
Protein, UA: POSITIVE — AB
Spec Grav, UA: >=1.03 — AB (ref 1.010–1.025)
Urobilinogen, UA: 1 U/dL
pH, UA: 5.5 (ref 5.0–8.0)

## 2023-04-15 NOTE — Progress Notes (Signed)
 I,[name], CMA,acting as a Neurosurgeon for [name], MD.,have documented all relevant documentation on the behalf of [name], MD,as directed by  [name], MD while in the presence of [name], MD.  Subjective:  Patient ID: Sheila Reynolds , female    DOB: 1945-04-13 , 78 y.o.   MRN: [id number]  Chief Complaint  Patient presents with   Hypertension   Diabetes    HPI  Patient presents today for  ER f/u and DM/BP check. She presented to ER on 2/28 for further evaluation of near syncope. At the time, she stated she was dealing with a stomach bug.  She reported having diarrhea for a couple of days.  Multiple family members had similar symptoms.  There was no associated abdominal pain.  Patient reports that she had a diarrhea stool, stood up from the toilet, then felt weak and dizzy like she was going to pass out. ER eval: Abdominal exam was benign at arrival.  Vital signs were normal, workup was unremarkable.  Patient was given IV fluids, will discharge with symptomatic treatment for diarrhea.  Since discharge, she has not had any recurrent symptoms. However, since that illness, she has had generalized muscle cramps. She reports compliance with meds. She is accompanied by husband today. Denies having any headache, chest pain & sob.    Diabetes She presents for her follow-up diabetic visit. She has type 2 diabetes mellitus. Her disease course has been stable. There are no hypoglycemic associated symptoms. Pertinent negatives for hypoglycemia include no dizziness or headaches. Pertinent negatives for diabetes include no blurred vision, no chest pain, no polydipsia, no polyphagia, no polyuria and no weakness. There are no hypoglycemic complications. Diabetic complications include heart disease. Her weight is stable. Her breakfast blood glucose is taken between 7-8 am. Her breakfast blood glucose range is generally 90-110 mg/dl. An ACE inhibitor/angiotensin II receptor  blocker is being taken. Eye exam is current.  Hypertension This is a chronic problem. The current episode started more than 1 year ago. The problem has been gradually improving since onset. The problem is controlled. Pertinent negatives include no blurred vision, chest pain, headaches, orthopnea, palpitations or shortness of breath. Risk factors for coronary artery disease include diabetes mellitus, dyslipidemia, post-menopausal state and sedentary lifestyle. The current treatment provides moderate improvement. Compliance problems include exercise.      Past Medical History:  Diagnosis Date   Anemia    3 months ago anemic   Anxiety    on meds   Arthritis    "all over" (01/15/2017)   Asthma    Bronchitis with emphysema    Chest pain    Chronic bronchitis (HCC)    Coronary artery disease    a. 01/2017 she underwent orbital atherectomy/DES to the proxmal LAD and PTCA to ostial D2. 2D Echo 01/15/17 showed mild LVH, EF 60-65%, grade 1 DD.   Family history of anesthesia complication    daughter N/V   Fibromyalgia    GERD (gastroesophageal reflux disease)    on meds   Glaucoma    Heart murmur    History of hiatal hernia    Hx of echocardiogram    Echo (03/2013):  Tech limited; Mild focal basal septal hypertrophy, EF 60-65%, normal RVF   Hyperlipidemia    Hypertension    Nonsustained ventricular tachycardia (HCC)    OSA on CPAP    Premature atrial contractions    PVC (premature ventricular contraction)    a. Holter  12/16: NSR, occ PAC,PVCs   Sarcoidosis    Type II diabetes mellitus (HCC)      Family History  Problem Relation Age of Onset   Heart disease Father    Diabetes Father    Glaucoma Father    Cancer Mother        unknown type, ?lung   Heart disease Paternal Grandmother    Cancer Paternal Grandmother        unknown type   Diabetes Brother    Glaucoma Brother      Current Outpatient Medications:    acetaminophen (TYLENOL) 500 MG tablet, Take 1,000 mg by mouth 2  (two) times daily as needed for moderate pain or headache., Disp: , Rfl:    albuterol (VENTOLIN HFA) 108 (90 Base) MCG/ACT inhaler, Inhale 2 puffs into the lungs every 6 (six) hours as needed for wheezing or shortness of breath., Disp: 18 g, Rfl: 12   ANORO ELLIPTA 62.5-25 MCG/ACT AEPB, INHALE 1 PUFF INTO THE LUNGS DAILY AT 6 AM, Disp: 60 each, Rfl: 3   aspirin EC 81 MG tablet, Take 81 mg by mouth daily. Swallow whole., Disp: , Rfl:    atorvastatin (LIPITOR) 80 MG tablet, Take 1 tablet (80 mg total) by mouth daily., Disp: 90 tablet, Rfl: 3   Blood Glucose Monitoring Suppl (ACCU-CHEK AVIVA PLUS) w/Device KIT, Use to check blood sugars 3 times a day. Dx code e11.65, Disp: 1 kit, Rfl: 3   brimonidine (ALPHAGAN P) 0.1 % SOLN, Place 1 drop into both eyes in the morning, at noon, and at bedtime., Disp: , Rfl:    brinzolamide (AZOPT) 1 % ophthalmic suspension, Place 1 drop into both eyes 3 (three) times daily., Disp: , Rfl:    carvedilol (COREG) 25 MG tablet, TAKE 1 TABLET(25 MG) BY MOUTH TWICE DAILY, Disp: 180 tablet, Rfl: 1   clopidogrel (PLAVIX) 75 MG tablet, TAKE 1 TABLET(75 MG) BY MOUTH DAILY, Disp: 90 tablet, Rfl: 2   EPINEPHrine 0.3 mg/0.3 mL IJ SOAJ injection, Inject 0.3 mg into the muscle as needed for anaphylaxis., Disp: 1 each, Rfl: 0   ezetimibe (ZETIA) 10 MG tablet, Take 1 tablet (10 mg total) by mouth daily., Disp: 90 tablet, Rfl: 1   furosemide (LASIX) 40 MG tablet, Take 1 tablet (40 mg total) by mouth daily as needed., Disp: 90 tablet, Rfl: 1   isosorbide mononitrate (IMDUR) 60 MG 24 hr tablet, TAKE 1 TABLET(60 MG) BY MOUTH DAILY, Disp: 90 tablet, Rfl: 2   JANUMET 50-500 MG tablet, TAKE 1 TABLET BY MOUTH TWICE DAILY WITH A MEAL, Disp: 90 tablet, Rfl: 1   ketorolac (ACULAR) 0.5 % ophthalmic solution, Place 1 drop into the right eye 3 (three) times daily., Disp: , Rfl:    Lancets (ONETOUCH DELICA PLUS LANCET33G) MISC, CHECK BLOOD SUGAR BEFORE BREAKFAST AND DINNER, Disp: 100 each, Rfl: 1    montelukast (SINGULAIR) 10 MG tablet, TAKE 1 TABLET BY MOUTH EVERY DAY, Disp: 90 tablet, Rfl: 1   Nebulizers (COMPRESSOR/NEBULIZER) MISC, Use as directed, Disp: 1 each, Rfl: 0   ondansetron (ZOFRAN-ODT) 4 MG disintegrating tablet, Take 1 tablet (4 mg total) by mouth every 8 (eight) hours as needed for nausea or vomiting., Disp: 20 tablet, Rfl: 0   pantoprazole (PROTONIX) 40 MG tablet, Take 1 tablet (40 mg total) by mouth daily., Disp: 90 tablet, Rfl: 2   Polyvinyl Alcohol-Povidone PF (REFRESH) 1.4-0.6 % SOLN, Place 1-2 drops into both eyes 3 (three) times daily as needed (dry/irritated eyes.)., Disp: , Rfl:  prednisoLONE acetate (PRED FORTE) 1 % ophthalmic suspension, Place 2-3 drops into both eyes in the morning, at noon, and at bedtime. Place 2 Drops in Left Eye, Place 3 Drops In Right Eye TID, Disp: , Rfl:    pregabalin (LYRICA) 75 MG capsule, TAKE 1 CAPSULE(75 MG) BY MOUTH TWICE DAILY, Disp: 180 capsule, Rfl: 1   RHOPRESSA 0.02 % SOLN, Place 1 drop into the left eye at bedtime., Disp: , Rfl:    tiZANidine (ZANAFLEX) 4 MG tablet, Take 0.5 tablets (2 mg total) by mouth every 6 (six) hours as needed for muscle spasms., Disp: 15 tablet, Rfl: 0   albuterol (PROVENTIL) (2.5 MG/3ML) 0.083% nebulizer solution, Take 3 mLs (2.5 mg total) by nebulization daily as needed for wheezing or shortness of breath. (Patient not taking: Reported on 04/15/2023), Disp: 75 mL, Rfl: 12   Blood Glucose Monitoring Suppl (ACCU-CHEK GUIDE) w/Device KIT, USE AS NEEDED TO CHECK BLOOD SUGARS., Disp: 1 kit, Rfl: 0   cetirizine (ZYRTEC ALLERGY) 10 MG tablet, Take 1 tablet (10 mg total) by mouth daily., Disp: 30 tablet, Rfl: 2   ibuprofen (ADVIL) 600 MG tablet, Take 1 tablet (600 mg total) by mouth every 6 (six) hours as needed. (Patient not taking: Reported on 04/15/2023), Disp: 30 tablet, Rfl: 0   loperamide (IMODIUM) 2 MG capsule, Take 1 capsule (2 mg total) by mouth 4 (four) times daily as needed for diarrhea or loose stools.  (Patient not taking: Reported on 04/15/2023), Disp: 12 capsule, Rfl: 0   nitroGLYCERIN (NITROSTAT) 0.4 MG SL tablet, Place 1 tablet (0.4 mg total) under the tongue every 5 (five) minutes as needed for chest pain. (Patient not taking: Reported on 04/15/2023), Disp: 25 tablet, Rfl: prn   potassium chloride SA (KLOR-CON) 20 MEQ tablet, Take 1 tablet (20 mEq total) by mouth every other day. (Patient not taking: Reported on 04/15/2023), Disp: 90 tablet, Rfl: 1   Allergies  Allergen Reactions   Crestor [Rosuvastatin Calcium] Other (See Comments)    muscle aches   Demerol  [Meperidine Hcl]     Other reaction(s): Hallucinations   Shellfish Allergy Itching and Other (See Comments)    Crab, shrimp and lobster ---lips itch and tingle Was told not to eat again after having a allergy test. Lobster, crab and shrimp      Review of Systems  Constitutional: Negative.   Eyes:  Negative for blurred vision.  Respiratory: Negative.  Negative for shortness of breath.   Cardiovascular: Negative.  Negative for chest pain, palpitations and orthopnea.  Gastrointestinal: Negative.   Endocrine: Negative for polydipsia, polyphagia and polyuria.  Musculoskeletal:  Positive for myalgias.  Neurological: Negative.  Negative for dizziness, weakness and headaches.  Psychiatric/Behavioral: Negative.       Today's Vitals   04/15/23 1544  BP: 122/62  Pulse: 80  Temp: 98.9 F (37.2 C)  SpO2: 98%  Weight: 167 lb 9.6 oz (76 kg)  Height: 5\' 2"  (1.575 m)   Body mass index is 30.65 kg/m.  Wt Readings from Last 3 Encounters:  04/15/23 167 lb 9.6 oz (76 kg)  02/23/23 162 lb (73.5 kg)  02/23/23 165 lb 9.6 oz (75.1 kg)     Objective:  Physical Exam Vitals and nursing note reviewed.  Constitutional:      Appearance: Normal appearance.  HENT:     Head: Normocephalic and atraumatic.  Eyes:     Extraocular Movements: Extraocular movements intact.  Cardiovascular:     Rate and Rhythm: Normal rate and regular rhythm.  Heart sounds: Normal heart sounds.  Pulmonary:     Effort: Pulmonary effort is normal.     Breath sounds: Normal breath sounds.  Musculoskeletal:     Cervical back: Normal range of motion.     Comments: Ambulatory with cane  Skin:    General: Skin is warm.  Neurological:     General: No focal deficit present.     Mental Status: She is alert.  Psychiatric:        Mood and Affect: Mood normal.        Behavior: Behavior normal.         Assessment And Plan:  Near syncope Assessment & Plan: ED notes reviewed in detail. She is encouraged to stay well hydrated.  Sx have since resolved.    Viral gastroenteritis Assessment & Plan: Sx have resolved. She is encouraged to try to stay well hydrated when dealing with GI issues.    Myalgia -     CK -     Magnesium  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 heart failure (HCC) Assessment & Plan: Chronic, well controlled. Importance of medication compliance was discussed with the patient. Encouraged to follow low sodium diet. No med changes today.   Orders: -     POCT urinalysis dipstick -     Referral to Nutrition and Diabetes Services  Atherosclerosis of aorta (HCC) Assessment & Plan: Chronic, LDL goal < 70.  She will continue with atorvastatin 80mg  and Zetia 10mg  daily. She is encouraged to follow a heart healthy lifestyle.   Orders: -     Referral to Nutrition and Diabetes Services  Dyslipidemia associated with type 2 diabetes mellitus (HCC) Assessment & Plan: Chronic, she is currently on Janumet. Importance of dietary and medication compliance was discussed with the patient. LDL goal is less than 70, she will continue with atorvastatin 80mg  daily and ezetimibe 10mg  daily. She is encouraged to follow heart healthy lifestyle.   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 Last PHQ-9  performed in August 2024. She is not on meds at this time.    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  Their input is appreciated.  She had repeat CT in Dec 2024 for f/u. She does have h/o of sarcoidosis, this is part of differential.    Class 1 obesity due to excess calories with serious comorbidity and body mass index (BMI) of 30.0 to 30.9 in adult Assessment & Plan: Her BMI is acceptable for her demographic. She is encouraged to incorporate more exercise into her daily routine.    She is encouraged to strive for BMI less than 30 to decrease cardiac risk. Advised to aim for at least 150 minutes of exercise per week.    Return in about 4 months (around 08/15/2023), or dm check.  Patient was given opportunity to ask questions. Patient verbalized understanding of the plan and was able to repeat key elements of the plan. All questions were answered to their satisfaction.    I, [name], MD, have reviewed all documentation for this visit. The documentation on 04/15/23 for the exam, diagnosis, procedures, and orders are all accurate and complete.   IF YOU HAVE BEEN REFERRED TO A SPECIALIST, IT MAY TAKE 1-2  WEEKS TO SCHEDULE/PROCESS THE REFERRAL. IF YOU HAVE NOT HEARD FROM US/SPECIALIST IN TWO WEEKS, PLEASE GIVE [place] A CALL AT [phone].   THE PATIENT IS ENCOURAGED TO PRACTICE SOCIAL DISTANCING DUE TO THE COVID-19 PANDEMIC.

## 2023-04-15 NOTE — Patient Instructions (Signed)
 Hypertension, Adult Hypertension is another name for high blood pressure. High blood pressure forces your heart to work harder to pump blood. This can cause problems over time. There are two numbers in a blood pressure reading. There is a top number (systolic) over a bottom number (diastolic). It is best to have a blood pressure that is below 120/80. What are the causes? The cause of this condition is not known. Some other conditions can lead to high blood pressure. What increases the risk? Some lifestyle factors can make you more likely to develop high blood pressure: Smoking. Not getting enough exercise or physical activity. Being overweight. Having too much fat, sugar, calories, or salt (sodium) in your diet. Drinking too much alcohol. Other risk factors include: Having any of these conditions: Heart disease. Diabetes. High cholesterol. Kidney disease. Obstructive sleep apnea. Having a family history of high blood pressure and high cholesterol. Age. The risk increases with age. Stress. What are the signs or symptoms? High blood pressure may not cause symptoms. Very high blood pressure (hypertensive crisis) may cause: Headache. Fast or uneven heartbeats (palpitations). Shortness of breath. Nosebleed. Vomiting or feeling like you may vomit (nauseous). Changes in how you see. Very bad chest pain. Feeling dizzy. Seizures. How is this treated? This condition is treated by making healthy lifestyle changes, such as: Eating healthy foods. Exercising more. Drinking less alcohol. Your doctor may prescribe medicine if lifestyle changes do not help enough and if: Your top number is above 130. Your bottom number is above 80. Your personal target blood pressure may vary. Follow these instructions at home: Eating and drinking  If told, follow the DASH eating plan. To follow this plan: Fill one half of your plate at each meal with fruits and vegetables. Fill one fourth of your plate  at each meal with whole grains. Whole grains include whole-wheat pasta, brown rice, and whole-grain bread. Eat or drink low-fat dairy products, such as skim milk or low-fat yogurt. Fill one fourth of your plate at each meal with low-fat (lean) proteins. Low-fat proteins include fish, chicken without skin, eggs, beans, and tofu. Avoid fatty meat, cured and processed meat, or chicken with skin. Avoid pre-made or processed food. Limit the amount of salt in your diet to less than 1,500 mg each day. Do not drink alcohol if: Your doctor tells you not to drink. You are pregnant, may be pregnant, or are planning to become pregnant. If you drink alcohol: Limit how much you have to: 0-1 drink a day for women. 0-2 drinks a day for men. Know how much alcohol is in your drink. In the [place] one drink equals one 12 oz bottle of beer (355 mL), one 5 oz glass of wine (148 mL), or one 1 oz glass of hard liquor (44 mL). Lifestyle  Work with your doctor to stay at a healthy weight or to lose weight. Ask your doctor what the best weight is for you. Get at least 30 minutes of exercise that causes your heart to beat faster (aerobic exercise) most days of the week. This may include walking, swimming, or biking. Get at least 30 minutes of exercise that strengthens your muscles (resistance exercise) at least 3 days a week. This may include lifting weights or doing Pilates. Do not smoke or use any products that contain nicotine or tobacco. If you need help quitting, ask your doctor. Check your blood pressure at home as told by your doctor. Keep all follow-up visits. Medicines Take over-the-counter and prescription medicines  only as told by your doctor. Follow directions carefully. Do not skip doses of blood pressure medicine. The medicine does not work as well if you skip doses. Skipping doses also puts you at risk for problems. Ask your doctor about side effects or reactions to medicines that you should watch  for. Contact a doctor if: You think you are having a reaction to the medicine you are taking. You have headaches that keep coming back. You feel dizzy. You have swelling in your ankles. You have trouble with your vision. Get help right away if: You get a very bad headache. You start to feel mixed up (confused). You feel weak or numb. You feel faint. You have very bad pain in your: Chest. Belly (abdomen). You vomit more than once. You have trouble breathing. These symptoms may be an emergency. Get help right away. Call 911. Do not wait to see if the symptoms will go away. Do not drive yourself to the hospital. Summary Hypertension is another name for high blood pressure. High blood pressure forces your heart to work harder to pump blood. For most people, a normal blood pressure is less than 120/80. Making healthy choices can help lower blood pressure. If your blood pressure does not get lower with healthy choices, you may need to take medicine. This information is not intended to replace advice given to you by your health care provider. Make sure you discuss any questions you have with your health care provider. Document Revised: 11/15/2020 Document Reviewed: 11/15/2020 Elsevier Patient Education  2024 [employer].

## 2023-04-16 ENCOUNTER — Other Ambulatory Visit: Payer: Self-pay

## 2023-04-16 DIAGNOSIS — E785 Hyperlipidemia, unspecified: Secondary | ICD-10-CM

## 2023-04-16 LAB — HEMOGLOBIN A1C
Est. average glucose Bld gHb Est-mCnc: 131 mg/dL
Hgb A1c MFr Bld: 6.2 % — ABNORMAL HIGH (ref 4.8–5.6)

## 2023-04-16 LAB — MAGNESIUM: Magnesium: 1.6 mg/dL (ref 1.6–2.3)

## 2023-04-16 LAB — CK: Total CK: 97 U/L (ref 32–182)

## 2023-04-16 MED ORDER — ACCU-CHEK GUIDE W/DEVICE KIT: PACK | 0 refills | Status: AC

## 2023-04-19 LAB — URINE CULTURE

## 2023-04-20 ENCOUNTER — Other Ambulatory Visit: Payer: Self-pay | Admitting: Internal Medicine

## 2023-04-20 MED ORDER — CEPHALEXIN 500 MG PO CAPS: 500.0000 mg | ORAL_CAPSULE | Freq: Two times a day (BID) | ORAL | 0 refills | Status: AC

## 2023-04-28 DIAGNOSIS — M791 Myalgia, unspecified site: Secondary | ICD-10-CM | POA: Insufficient documentation

## 2023-04-28 NOTE — Assessment & Plan Note (Signed)
 ED notes reviewed in detail. She is encouraged to stay well hydrated.  Sx have since resolved.

## 2023-04-28 NOTE — Assessment & Plan Note (Signed)
 Sx are currently stable. Last PHQ-9 performed in August 2024. She is not on meds at this time.

## 2023-04-28 NOTE — Assessment & Plan Note (Addendum)
 Chronic, she is currently on Janumet. Importance of dietary and medication compliance was discussed with the patient. LDL goal is less than 70, she will continue with atorvastatin 80mg  daily and ezetimibe 10mg  daily. She is encouraged to follow heart healthy lifestyle.

## 2023-04-28 NOTE — Assessment & Plan Note (Signed)
 Chronic, well controlled. Importance of medication compliance was discussed with the patient. Encouraged to follow low sodium diet. No med changes today.

## 2023-04-28 NOTE — Assessment & Plan Note (Signed)
 Sx have resolved. She is encouraged to try to stay well hydrated when dealing with GI issues.

## 2023-04-28 NOTE — Assessment & Plan Note (Signed)
 Her BMI is acceptable for her demographic. She is encouraged to incorporate more exercise into her daily routine.

## 2023-04-28 NOTE — Assessment & Plan Note (Signed)
 Chronic, LDL goal < 70.  She will continue with atorvastatin 80mg  and Zetia 10mg  daily. She is encouraged to follow a heart healthy lifestyle.

## 2023-04-28 NOTE — Assessment & Plan Note (Signed)
 She has been evaluated by Pulmonary.  Their input is appreciated.  She had repeat CT in Dec 2024 for f/u. She does have h/o of sarcoidosis, this is part of differential.

## 2023-05-04 ENCOUNTER — Telehealth (HOSPITAL_BASED_OUTPATIENT_CLINIC_OR_DEPARTMENT_OTHER): Payer: Self-pay | Admitting: *Deleted

## 2023-05-04 ENCOUNTER — Encounter (HOSPITAL_BASED_OUTPATIENT_CLINIC_OR_DEPARTMENT_OTHER): Payer: Self-pay | Admitting: Cardiovascular Disease

## 2023-05-04 ENCOUNTER — Ambulatory Visit (HOSPITAL_BASED_OUTPATIENT_CLINIC_OR_DEPARTMENT_OTHER): Payer: Medicare Other | Admitting: Cardiovascular Disease

## 2023-05-04 VITALS — BP 119/72 | HR 72 | Ht 62.0 in | Wt 165.0 lb

## 2023-05-04 DIAGNOSIS — I493 Ventricular premature depolarization: Secondary | ICD-10-CM | POA: Diagnosis not present

## 2023-05-04 DIAGNOSIS — I10 Essential (primary) hypertension: Secondary | ICD-10-CM

## 2023-05-04 DIAGNOSIS — R531 Weakness: Secondary | ICD-10-CM

## 2023-05-04 DIAGNOSIS — I25119 Atherosclerotic heart disease of native coronary artery with unspecified angina pectoris: Secondary | ICD-10-CM | POA: Diagnosis not present

## 2023-05-04 DIAGNOSIS — I5032 Chronic diastolic (congestive) heart failure: Secondary | ICD-10-CM | POA: Diagnosis not present

## 2023-05-04 DIAGNOSIS — I700 Atherosclerosis of aorta: Secondary | ICD-10-CM | POA: Diagnosis not present

## 2023-05-04 DIAGNOSIS — R2689 Other abnormalities of gait and mobility: Secondary | ICD-10-CM | POA: Diagnosis not present

## 2023-05-04 DIAGNOSIS — R55 Syncope and collapse: Secondary | ICD-10-CM | POA: Diagnosis not present

## 2023-05-04 NOTE — Patient Instructions (Addendum)
 Medication Instructions:  STOP CLOPIDOGREL   *If you need a refill on your cardiac medications before your next appointment, please call your pharmacy*  Lab Work: NONE  Testing/Procedures: NONE  Follow-Up: At [facility], you and your health needs are our priority.  As part of our continuing mission to provide you with exceptional heart care, we have created designated Provider Care Teams.  These Care Teams include your primary Cardiologist (physician) and Advanced Practice Providers (APPs -  Physician Assistants and Nurse Practitioners) who all work together to provide you with the care you need, when you need it.  We recommend signing up for the patient portal called "MyChart".  Sign up information is provided on this After Visit Summary.  MyChart is used to connect with patients for Virtual Visits (Telemedicine).  Patients are able to view lab/test results, encounter notes, upcoming appointments, etc.  Non-urgent messages can be sent to your provider as well.   To learn more about what you can do with MyChart, go to [email or web address].    Your next appointment:   4 month(s)  Provider:   Gillian Shields, NP   Other Instructions You have been referred to PHYSICAL THERAPY FOR STRENGTH AND [facility]ANCE  IF YOU DO NOT HEAR FROM THEM IN 2 WEEKS YOU CAN CALL THEM DIRECTLY AT [phone]   WEAR COMPRESSION STOCKINGS   CALL NEUROLOGY REGARDING EXCESSIVE SLEEPINESS   MAKE SURE YOU ARE DRINKING AT LEAST 2 LITERS (67-68 OUNCES) A DAY

## 2023-05-04 NOTE — Progress Notes (Signed)
 Cardiology Office Note:  .   Date:  05/04/2023  ID:  Sheila Reynolds, DOB 1945-06-25, MRN [id number] PCP: Dorothyann Peng, MD   [facility] Providers Cardiologist:  Chilton Si, MD    History of Present Illness: Sheila Reynolds is a 78 y.o. female with CAD status post LAD PCI, NSVT, mild carotid stenosis, sarcoidosis, hypertension, and hyperlipidemia here for follow-up.  She was previously a patient of Dr. Elease Hashimoto.  She had a left heart cath in 2018 that revealed single-vessel disease with 70% proximal/mid LAD and 70% ostial D2 lesions.  At that time she was medically managed.  She continued to have symptoms and later underwent orbital atherectomy and PCI with Xience DES to the LAD and PTCA of D2.  HMs. Polhemus had an episode of syncope 11/2022.  It occurred when standing and eating a sandwich.  She reported to his prior episodes that were similar.  Her most recent echo 11/2022 revealed LVEF 60-65% and was otherwise unremarkable.  Carotid Dopplers revealed mild stenosis bilaterally.  She was treated for UTI at the time.  She last saw Gillian Shields, NP 02/2023 and reported excessive daytime somnolence.  TSH, CBC, and CMP were unremarkable.  Ms. Gick has struggled with palpitations.  Her last ZIO monitor 02/2023 revealed an episode of SVT up to 6 beats.  Discussed the use of AI scribe software for clinical note transcription with the patient, who gave verbal consent to proceed.  History of Present Illness Sheila Reynolds is a 78 year old female who presents with recurrent falls and episodes of blacking out. She is accompanied by her husband, Thayer Ohm.  She experiences frequent falls and blackouts, leading to multiple emergency room visits. She describes episodes of tripping and balance issues, often feeling like she might tumble over. She uses a cane for stability when outside but not at home. She has experienced blackouts, including one incident where she was unconscious for over  two hours after falling at home. Episodes of blacking out occur particularly during bowel movements, accompanied by a 'funny feeling' in her head and lightheadedness. Her husband, a retired Company secretary, has witnessed these episodes and often calls for emergency assistance. She has been hospitalized for these episodes, with extensive testing conducted to determine the cause.  Her blood pressure is generally low, with a recent reading of 108/68. She takes carvedilol, which she believes may be contributing to her low blood pressure. She also takes aspirin, Plavix, atorvastatin, and another cholesterol medication. She expresses concern about being overmedicated.  She mentions having new diabetic shoes and braces, but the braces make her feel more unstable. She has been prescribed compression socks and a lymphedema compression machine, which she does not use regularly. She also experiences shortness of breath when climbing stairs, but not while walking on flat surfaces.  She has a history of lymphedema and uses a bed with an elevated foot to help manage it. She has not been consistent with using her lymphedema compression machine.  She experiences excessive sleepiness, often falling asleep while watching TV. She has not discussed this with her neurologist, who she sees for her blackouts.  ROS:  As per HPI  Studies Reviewed: [name]       [facility] 12/2016: Conclusions: Significant single-vessel coronary artery disease with long, calcified proximal/mid LAD stenosis of up to 70% (resting PD/PA 0.79), as well as 70% ostial D2 stenosis. No significant disease involving codominant LCx and RCA. Upper normal to mildly elevated left and right heart  filling pressures. Mild pulmonary hypertension. Low normal Fick cardiac output/index.   Recommendations: Optimize medical therapy, including addition of isosorbide mononitrate and clopidogrel. Patient should follow-up in 1-2 weeks with Dr. Elease Hashimoto or Norma Fredrickson to reassess  her symptoms.  If she continues to have significant shortness of breath and/or dyspnea, PCI to the proximal/mid LAD with orbital atherectomy should be considered. Aggressive secondary prevention.  [facility] 01/2017: Conclusions: 70% proximal/mid LAD stenosis (previously found to be hemodynamically significant with resting Pd/Pa of 0.79). 70% ostial D2 stenosis. Successful orbital atherectomy and PCI using Xience Sierra 3.5 x 33 mm DES with 10% proximal stenosis (post-dilated with 4.0 Cheval balloon at high pressure) and TIMI-3 flow. Successful PTCA to ostial D2 (including kissing balloon inflation) with reduction in stenosis to 50% and TIMI-3 flow.   Recommendations: Dual antiplatelet therapy with aspirin and clopidogrel for at least 6 months, ideally longer. Aggressive secondary prevention. Overnight observation and cardiac rehab after discharge.  Echo 11/2022: IMPRESSIONS     1. Left ventricular ejection fraction, by estimation, is 60 to 65%. The  left ventricle has normal function. The left ventricle has no regional  wall motion abnormalities. Left ventricular diastolic function could not  be evaluated.   2. Right ventricular systolic function is normal. The right ventricular  size is normal.   3. The mitral valve is normal in structure. No evidence of mitral valve  regurgitation. No evidence of mitral stenosis.   4. The aortic valve is normal in structure. Aortic valve regurgitation is  not visualized. No aortic stenosis is present.   5. The inferior vena cava is normal in size with greater than 50%  respiratory variability, suggesting right atrial pressure of 3 mmHg.    12 Day Zio Monitor 02/2023:   Predominant rhythm is sinus rhythm.  She had 1 episode of SVT which lasted 6 beats with a maximum heart rate of 154 and an average heart rate of 143.   Rare premature atrial contractions, rare premature ventricular contractions   No triggered events are present   There were serious  arrhythmias.     Risk Assessment/Calculations:             Physical Exam:   VS:  BP 108/68   Pulse 78   Ht 5\' 2"  (1.575 m)   Wt 165 lb (74.8 kg)   SpO2 99%   BMI 30.18 kg/m  , BMI Body mass index is 30.18 kg/m. GENERAL:  Well appearing HEENT: Pupils equal round and reactive, fundi not visualized, oral mucosa unremarkable NECK:  No jugular venous distention, waveform within normal limits, carotid upstroke brisk and symmetric, no bruits, no thyromegaly LUNGS:  Clear to auscultation bilaterally HEART:  RRR.  PMI not displaced or sustained,S1 and S2 within normal limits, no S3, no S4, no clicks, no rubs, no murmurs ABD:  Flat, positive bowel sounds normal in frequency in pitch, no bruits, no rebound, no guarding, no midline pulsatile mass, no hepatomegaly, no splenomegaly EXT:  2 plus pulses throughout, no edema, no cyanosis no clubbing SKIN:  No rashes no nodules NEURO:  Cranial nerves II through XII grossly intact, motor grossly intact throughout PSYCH:  Cognitively intact, oriented to person place and time   ASSESSMENT AND PLAN: .    Assessment & Plan # Vasovagal syncope Episodes consistent with vasovagal syncope due to parasympathetic response causing hypotension and bradycardia. Explained mechanism and importance of maintaining adequate blood pressure. - She was not orthostaitc but BP is low in the office.  Reduce  carvedilol to 12.5mg  bid.  - Encourage increased fluid intake. - Advise to lie down with feet elevated at onset of symptoms. - Recommend wearing TED hose. - Refer to physical therapy for balance and strengthening exercises.  # Hypertension Blood pressure low at 108/68 mmHg, contributing to syncope. Discussed medication adjustment to prevent hypotension and ensure adequate cerebral perfusion. - Reduce carvedilol as above. - Monitor blood pressure lying, sitting, and standing. - Consider reducing carvedilol dosage based on readings.  # Coronary artery disease  with stent placement Coronary artery disease with stent placement. On dual antiplatelet therapy. Clopidogrel can be discontinued as it has been over a year since stent placement. - Discontinue clopidogrel. - Continue aspirin therapy. - Continue isosorbide mononitrate.  # Hyperlipidemia LDL cholesterol elevated at 112 mg/dL, above target. Emphasized maintaining atorvastatin and ezetimibe to prevent disease progression. - Continue atorvastatin and ezetimibe. - Recheck cholesterol levels in a couple of months.  # Lymphedema Significant lymphedema with irregular use of compression machine. Encouraged regular use to manage lymphedema and support circulation. - Encourage regular use of compression machine. - Advise wearing compression stockings regularly.  # Excessive daytime sleepiness Reports falling asleep easily, possibly related to a neurological disorder. Advised discussing with neurologist.  She has already been tested for OSA>  - Discuss excessive sleepiness with neurologist at next appointment.  # Follow-up Plans to monitor progress and adjust treatment as needed. - Follow up in four months to reassess blood pressure management and overall progress. - Recheck cholesterol levels before next follow-up.    Dispo: f/u in 4 months  Signed, Chilton Si, MD

## 2023-05-04 NOTE — Telephone Encounter (Signed)
 Patient seen in clinic today While she was reviewing chart doing dictation she wanted to reduce Carvedilol to 12.5 mg twice a day  Called patient and reviewed recommendations

## 2023-05-05 DIAGNOSIS — M1990 Unspecified osteoarthritis, unspecified site: Secondary | ICD-10-CM | POA: Diagnosis not present

## 2023-05-05 DIAGNOSIS — R768 Other specified abnormal immunological findings in serum: Secondary | ICD-10-CM | POA: Diagnosis not present

## 2023-05-05 DIAGNOSIS — M0609 Rheumatoid arthritis without rheumatoid factor, multiple sites: Secondary | ICD-10-CM | POA: Diagnosis not present

## 2023-05-05 DIAGNOSIS — H209 Unspecified iridocyclitis: Secondary | ICD-10-CM | POA: Diagnosis not present

## 2023-05-05 DIAGNOSIS — H15009 Unspecified scleritis, unspecified eye: Secondary | ICD-10-CM | POA: Diagnosis not present

## 2023-05-05 DIAGNOSIS — M7989 Other specified soft tissue disorders: Secondary | ICD-10-CM | POA: Diagnosis not present

## 2023-05-05 DIAGNOSIS — D869 Sarcoidosis, unspecified: Secondary | ICD-10-CM | POA: Diagnosis not present

## 2023-05-05 DIAGNOSIS — Z79899 Other long term (current) drug therapy: Secondary | ICD-10-CM | POA: Diagnosis not present

## 2023-05-05 DIAGNOSIS — M2550 Pain in unspecified joint: Secondary | ICD-10-CM | POA: Diagnosis not present

## 2023-05-05 DIAGNOSIS — J8410 Pulmonary fibrosis, unspecified: Secondary | ICD-10-CM | POA: Diagnosis not present

## 2023-05-06 ENCOUNTER — Encounter: Payer: Medicare Other | Admitting: Internal Medicine

## 2023-05-06 ENCOUNTER — Ambulatory Visit

## 2023-05-06 ENCOUNTER — Other Ambulatory Visit: Payer: Self-pay

## 2023-05-06 DIAGNOSIS — Z91018 Allergy to other foods: Secondary | ICD-10-CM

## 2023-05-06 DIAGNOSIS — Z0000 Encounter for general adult medical examination without abnormal findings: Secondary | ICD-10-CM | POA: Diagnosis not present

## 2023-05-06 MED ORDER — EPINEPHRINE 0.3 MG/0.3ML IJ SOAJ: 0.3000 mg | INTRAMUSCULAR | 0 refills | Status: AC | PRN

## 2023-05-06 MED ORDER — CETIRIZINE HCL 10 MG PO TABS: 10.0000 mg | ORAL_TABLET | Freq: Every day | ORAL | 2 refills | Status: DC

## 2023-05-06 NOTE — Patient Instructions (Signed)
 Ms. Deamer , Thank you for taking time to come for your Medicare Wellness Visit. I appreciate your ongoing commitment to your health goals. Please review the following plan we discussed and let me know if I can assist you in the future.   Referrals/Orders/Follow-Ups/Clinician Recommendations: needs help with getting new hearing aids. And would like referral to urologist  This is a list of the screening recommended for you and due dates:  Health Maintenance  Topic Date Due   COVID-19 Vaccine (5 - 2024-25 season) 10/12/2022   Complete foot exam   11/20/2022   Yearly kidney health urinalysis for diabetes  06/05/2023   Eye exam for diabetics  05/29/2023   Mammogram  06/10/2023   Hemoglobin A1C  10/16/2023   Yearly kidney function blood test for diabetes  04/09/2024   Medicare Annual Wellness Visit  05/05/2024   Colon Cancer Screening  01/09/2030   DTaP/Tdap/Td vaccine (2 - Td or Tdap) 02/26/2032   Pneumonia Vaccine  Completed   Flu Shot  Completed   DEXA scan (bone density measurement)  Completed   Hepatitis C Screening  Completed   Zoster (Shingles) Vaccine  Completed   HPV Vaccine  Aged Out    Advanced directives: (Copy Requested) Please bring a copy of your health care power of attorney and living will to the office to be added to your chart at your convenience. You can mail to [facility] [address] [place], [place] [address] or email to [email or web address]  Next Medicare Annual Wellness Visit scheduled for next year: Yes  insert Preventive Care attachment Insert FALL PREVENTION attachment if needed

## 2023-05-06 NOTE — Progress Notes (Signed)
 Subjective:   Sheila Reynolds is a 78 y.o. who presents for a Medicare Wellness preventive visit.  Visit Complete: Virtual I connected with  Sheila Reynolds on 05/06/23 by a audio enabled telemedicine application and verified that I am speaking with the correct person using two identifiers.  Patient Location: Home  Provider Location: Office/Clinic  I discussed the limitations of evaluation and management by telemedicine. The patient expressed understanding and agreed to proceed.  Vital Signs: Because this visit was a virtual/telehealth visit, some criteria may be missing or patient reported. Any vitals not documented were not able to be obtained and vitals that have been documented are patient reported.  VideoError- Librarian, academic were attempted between this provider and patient, however failed, due to patient having technical difficulties OR patient did not have access to video capability.  We continued and completed visit with audio only.   Persons Participating in Visit: Patient.  AWV Questionnaire: No: Patient Medicare AWV questionnaire was not completed prior to this visit.  Cardiac Risk Factors include: advanced age (>33men, >63 women);diabetes mellitus;dyslipidemia;hypertension     Objective:    Today's Vitals   There is no height or weight on file to calculate BMI.     05/06/2023   11:52 AM 04/10/2023   11:04 PM 12/07/2022    5:10 PM 12/02/2022    9:16 PM 11/07/2022    2:29 AM 05/07/2022    1:20 AM 04/30/2022   11:02 AM  Advanced Directives  Does Patient Have a Medical Advance Directive? Yes No No No No Yes Yes  Type of Estate agent of [place];Living will      Living will;Healthcare Power of Attorney  Copy of Healthcare Power of Attorney in Chart? No - copy requested      No - copy requested  Would patient like information on creating a medical advance directive?  No - Patient declined No - Patient declined  No - Patient declined       Current Medications (verified) Outpatient Encounter Medications as of 05/06/2023  Medication Sig   acetaminophen (TYLENOL) 500 MG tablet Take 1,000 mg by mouth 2 (two) times daily as needed for moderate pain or headache.   albuterol (VENTOLIN HFA) 108 (90 Base) MCG/ACT inhaler Inhale 2 puffs into the lungs every 6 (six) hours as needed for wheezing or shortness of breath.   ANORO ELLIPTA 62.5-25 MCG/ACT AEPB INHALE 1 PUFF INTO THE LUNGS DAILY AT 6 AM   aspirin EC 81 MG tablet Take 81 mg by mouth daily. Swallow whole.   atorvastatin (LIPITOR) 80 MG tablet Take 1 tablet (80 mg total) by mouth daily.   Blood Glucose Monitoring Suppl (ACCU-CHEK AVIVA PLUS) w/Device KIT Use to check blood sugars 3 times a day. Dx code e11.65   Blood Glucose Monitoring Suppl (ACCU-CHEK GUIDE) w/Device KIT USE AS NEEDED TO CHECK BLOOD SUGARS.   brimonidine (ALPHAGAN P) 0.1 % SOLN Place 1 drop into both eyes in the morning, at noon, and at bedtime.   brinzolamide (AZOPT) 1 % ophthalmic suspension Place 1 drop into both eyes 3 (three) times daily.   carvedilol (COREG) 12.5 MG tablet Take 12.5 mg by mouth 2 (two) times daily with a meal.   EPINEPHrine 0.3 mg/0.3 mL IJ SOAJ injection Inject 0.3 mg into the muscle as needed for anaphylaxis.   ezetimibe (ZETIA) 10 MG tablet Take 1 tablet (10 mg total) by mouth daily.   furosemide (LASIX) 40 MG tablet Take 1 tablet (  40 mg total) by mouth daily as needed.   ibuprofen (ADVIL) 600 MG tablet Take 1 tablet (600 mg total) by mouth every 6 (six) hours as needed.   isosorbide mononitrate (IMDUR) 60 MG 24 hr tablet TAKE 1 TABLET(60 MG) BY MOUTH DAILY   JANUMET 50-500 MG tablet TAKE 1 TABLET BY MOUTH TWICE DAILY WITH A MEAL   ketorolac (ACULAR) 0.5 % ophthalmic solution Place 1 drop into the right eye 3 (three) times daily.   Lancets (ONETOUCH DELICA PLUS LANCET33G) MISC CHECK BLOOD SUGAR BEFORE BREAKFAST AND DINNER   montelukast (SINGULAIR) 10 MG tablet  TAKE 1 TABLET BY MOUTH EVERY DAY   nitroGLYCERIN (NITROSTAT) 0.4 MG SL tablet Place 1 tablet (0.4 mg total) under the tongue every 5 (five) minutes as needed for chest pain.   ondansetron (ZOFRAN-ODT) 4 MG disintegrating tablet Take 1 tablet (4 mg total) by mouth every 8 (eight) hours as needed for nausea or vomiting.   pantoprazole (PROTONIX) 40 MG tablet Take 1 tablet (40 mg total) by mouth daily.   Polyvinyl Alcohol-Povidone PF (REFRESH) 1.4-0.6 % SOLN Place 1-2 drops into both eyes 3 (three) times daily as needed (dry/irritated eyes.).   potassium chloride SA (KLOR-CON) 20 MEQ tablet Take 1 tablet (20 mEq total) by mouth every other day.   pregabalin (LYRICA) 75 MG capsule TAKE 1 CAPSULE(75 MG) BY MOUTH TWICE DAILY   RHOPRESSA 0.02 % SOLN Place 1 drop into the left eye at bedtime.   albuterol (PROVENTIL) (2.5 MG/3ML) 0.083% nebulizer solution Take 3 mLs (2.5 mg total) by nebulization daily as needed for wheezing or shortness of breath. (Patient not taking: Reported on 05/06/2023)   cetirizine (ZYRTEC ALLERGY) 10 MG tablet Take 1 tablet (10 mg total) by mouth daily.   loperamide (IMODIUM) 2 MG capsule Take 1 capsule (2 mg total) by mouth 4 (four) times daily as needed for diarrhea or loose stools. (Patient not taking: Reported on 05/06/2023)   Nebulizers (COMPRESSOR/NEBULIZER) MISC Use as directed (Patient not taking: Reported on 05/06/2023)   prednisoLONE acetate (PRED FORTE) 1 % ophthalmic suspension Place 2-3 drops into both eyes in the morning, at noon, and at bedtime. Place 2 Drops in Left Eye, Place 3 Drops In Right Eye TID (Patient not taking: Reported on 05/06/2023)   tiZANidine (ZANAFLEX) 4 MG tablet Take 0.5 tablets (2 mg total) by mouth every 6 (six) hours as needed for muscle spasms. (Patient not taking: Reported on 05/06/2023)   No facility-administered encounter medications on file as of 05/06/2023.    Allergies (verified) Crestor [rosuvastatin calcium], Demerol  [meperidine hcl], and  Shellfish allergy   History: Past Medical History:  Diagnosis Date   Anemia    3 months ago anemic   Anxiety    on meds   Arthritis    "all over" (01/15/2017)   Asthma    Bronchitis with emphysema    Chest pain    Chronic bronchitis (HCC)    Coronary artery disease    a. 01/2017 she underwent orbital atherectomy/DES to the proxmal LAD and PTCA to ostial D2. 2D Echo 01/15/17 showed mild LVH, EF 60-65%, grade 1 DD.   Family history of anesthesia complication    daughter N/V   Fibromyalgia    GERD (gastroesophageal reflux disease)    on meds   Glaucoma    Heart murmur    History of hiatal hernia    Hx of echocardiogram    Echo (03/2013):  Tech limited; Mild focal basal septal hypertrophy, EF  60-65%, normal RVF   Hyperlipidemia    Hypertension    Nonsustained ventricular tachycardia (HCC)    OSA on CPAP    Premature atrial contractions    PVC (premature ventricular contraction)    a. Holter 12/16: NSR, occ PAC,PVCs   Sarcoidosis    Type II diabetes mellitus (HCC)    Past Surgical History:  Procedure Laterality Date   BREAST SURGERY     CARDIAC CATHETERIZATION  05/04/2007   reveals overall normal left ventricular systolic function. Ejection fraction 65-70%   CATARACT EXTRACTION W/PHACO Right 07/20/2013   Procedure: CATARACT EXTRACTION PHACO AND INTRAOCULAR LENS PLACEMENT (IOC);  Surgeon: Chalmers Guest, MD;  Location: [facility] OR;  Service: Ophthalmology;  Laterality: Right;   COLONOSCOPY W/ BIOPSIES AND POLYPECTOMY     COLONOSCOPY WITH PROPOFOL N/A 09/07/2014   Procedure: COLONOSCOPY WITH PROPOFOL;  Surgeon: Charna Elizabeth, MD;  Location: [facility];  Service: Endoscopy;  Laterality: N/A;   COLONOSCOPY WITH PROPOFOL N/A 01/10/2020   Procedure: COLONOSCOPY WITH PROPOFOL;  Surgeon: Charna Elizabeth, MD;  Location: [facility];  Service: Endoscopy;  Laterality: N/A;   CORONARY ANGIOPLASTY WITH STENT PLACEMENT  01/15/2017   CORONARY ATHERECTOMY N/A 01/15/2017   Procedure: CORONARY  ATHERECTOMY;  Surgeon: Yvonne Kendall, MD;  [facility];  Service: Cardiovascular;  Laterality: N/A;   CORONARY BALLOON ANGIOPLASTY N/A 01/15/2017   Procedure: CORONARY BALLOON ANGIOPLASTY;  Surgeon: Yvonne Kendall, MD;  [facility]: [facility];  Service: Cardiovascular;  Laterality: N/A;   CORONARY PRESSURE/FFR STUDY N/A 12/12/2016   Procedure: INTRAVASCULAR PRESSURE WIRE/FFR STUDY;  Surgeon: Yvonne Kendall, MD;  [facility];  Service: Cardiovascular;  Laterality: N/A;   CORONARY STENT INTERVENTION N/A 01/15/2017   Procedure: CORONARY STENT INTERVENTION;  Surgeon: Yvonne Kendall, MD;  [facility];  Service: Cardiovascular;  Laterality: N/A;   ESOPHAGOGASTRODUODENOSCOPY (EGD) WITH PROPOFOL N/A 09/07/2014   Procedure: ESOPHAGOGASTRODUODENOSCOPY (EGD) WITH PROPOFOL;  Surgeon: Charna Elizabeth, MD;  [facility];  Service: Endoscopy;  Laterality: N/A;   EXTERNAL EAR SURGERY Bilateral 1970s   tumors removed   EYE SURGERY Left 2019   cataract extraction    EYE SURGERY Right 02/09/2018   eyelid surgery    MINI SHUNT INSERTION Right 07/20/2013   Procedure: INSERTION OF GLAUCOMA FILTRATION DEVICE RIGHT EYE;  Surgeon: Chalmers Guest, MD;  Location[facility] OR;  Service: Ophthalmology;  Laterality: Right;   MITOMYCIN C APPLICATION Right 07/20/2013   Procedure: MITOMYCIN C APPLICATION;  Surgeon: Chalmers Guest, MD;  Location: [facility] OR;  Service: Ophthalmology;  Laterality: Right;   MITOMYCIN C APPLICATION Right 02/21/2015   Procedure: MITOMYCIN C APPLICATION RIGHT EYE;  Surgeon: Chalmers Guest, MD;  Location: [facility] OR;  Service: Ophthalmology;  Laterality: Right;   PLACEMENT OF BREAST IMPLANTS Bilateral 1992   "took all my breast tissue out; put implants in;fibrocystic breast disease "   POLYPECTOMY  01/10/2020   Procedure: POLYPECTOMY;  Surgeon: Charna Elizabeth, MD;  [facility];  Service: Endoscopy;;   RIGHT/LEFT HEART CATH AND CORONARY ANGIOGRAPHY  N/A 12/12/2016   Procedure: RIGHT/LEFT HEART CATH AND CORONARY ANGIOGRAPHY;  Surgeon: Yvonne Kendall, MD;  [facility];  Service: Cardiovascular;  Laterality: N/A;   TONSILLECTOMY     TOTAL ABDOMINAL HYSTERECTOMY  1982   TRABECULECTOMY Right 02/21/2015   Procedure: TRABECULECTOMY WITH [facility] ON THE RIGHT EYE;  Surgeon: Chalmers Guest, MD;  Location: [facility] OR;  Service: Ophthalmology;  Laterality: Right;   Family History  Problem Relation Age  of Onset   Heart disease Father    Diabetes Father    Glaucoma Father    Cancer Mother        unknown type, ?lung   Heart disease Paternal Grandmother    Cancer Paternal Grandmother        unknown type   Diabetes Brother    Glaucoma Brother    Social History   Socioeconomic History   Marital status: Married    Spouse name: Thayer Ohm   Number of children: 2   Years of education: Not on file   Highest education level: Not on file  Occupational History   Occupation: unemployed    Employer: [employer]  Tobacco Use   Smoking status: Former    Current packs/day: 0.00    Average packs/day: 1 pack/day for 4.0 years (4.0 ttl pk-yrs)    Types: Cigarettes    Start date: 02/11/1976    Quit date: 02/11/1980    Years since quitting: 43.2   Smokeless tobacco: Never  Vaping Use   Vaping status: Never Used  Substance and Sexual Activity   Alcohol use: No   Drug use: No   Sexual activity: Yes  Other Topics Concern   Not on file  Social History Narrative   Lives with husband   Right hand   Drinks 2-3 cups caffeine daily   Social Drivers of Health   Financial Resource Strain: Low Risk  (05/06/2023)   Overall Financial Resource Strain (CARDIA)    Difficulty of Paying Living Expenses: Not hard at all  Food Insecurity: No Food Insecurity (05/06/2023)   Hunger Vital Sign    Worried About Running Out of Food in the Last Year: Never true    Ran Out of Food in the Last Year: Never true  Transportation Needs: No Transportation  Needs (05/06/2023)   PRAPARE - Administrator, Civil Service (Medical): No    Lack of Transportation (Non-Medical): No  Physical Activity: Inactive (05/06/2023)   Exercise Vital Sign    Days of Exercise per Week: 0 days    Minutes of Exercise per Session: 0 min  Stress: Stress Concern Present (05/06/2023)   [employer] of Occupational Health - Occupational Stress Questionnaire    Feeling of Stress : To some extent  Social Connections: Socially Integrated (05/06/2023)   Social Connection and Isolation Panel [NHANES]    Frequency of Communication with Friends and Family: More than three times a week    Frequency of Social Gatherings with Friends and Family: More than three times a week    Attends Religious Services: More than 4 times per year    Active Member of [employer] or Organizations: Yes    Attends Engineer, structural: More than 4 times per year    Marital Status: Married    Tobacco Counseling Counseling given: Not Answered    Clinical Intake:  Pre-visit preparation completed: Yes  Pain : No/denies pain     Nutritional Risks: None Diabetes: Yes CBG done?: No Did pt. bring in CBG monitor from home?: No  Lab Results  Component Value Date   HGBA1C 6.2 (H) 04/15/2023   HGBA1C 6.1 (H) 12/16/2022   HGBA1C 6.3 (H) 09/22/2022     How often do you need to have someone help you when you read instructions, pamphlets, or other written materials from your doctor or pharmacy?: 1 - Never  Interpreter Needed?: No  Information entered by :: NAllen LPN   Activities of Daily Living  05/06/2023   11:37 AM 12/04/2022    8:00 PM  In your present state of health, do you have any difficulty performing the following activities:  Hearing? 1 0  Comment has hearing aids but they do not work well   Vision? 1 0  Comment has glaucoma   Difficulty concentrating or making decisions? 0 0  Walking or climbing stairs? 1   Comment SOB sometimes   Dressing or  bathing? 0   Doing errands, shopping? 0 0  Preparing Food and eating ? N   Using the Toilet? N   In the past six months, have you accidently leaked urine? Y   Comment wears depends   Do you have problems with loss of bowel control? Y   Comment ocassional accidents   Managing your Medications? N   Managing your Finances? N   Housekeeping or managing your Housekeeping? N     Patient Care Team: Dorothyann Peng, MD as PCP - General (Internal Medicine) Chilton Si, MD as PCP - Cardiology (Cardiology) [name], Deloris Ping, MD as Consulting Physician (Cardiology) Harlan Stains, [facility] (Inactive) (Pharmacist)  Indicate any recent Medical Services you may have received from other than [facility] providers in the past year (date may be approximate).     Assessment:   This is a routine wellness examination for Shianne.  Hearing/Vision screen Hearing Screening - Comments:: Has hearing aids that need to be maintained Vision Screening - Comments:: Regular eye exams, [facility]   Goals Addressed             This Visit's Progress    Patient Stated       05/06/2023, wants to get balance in check, wants to gain strength       Depression Screen     05/06/2023   11:54 AM 04/15/2023    3:51 PM 09/22/2022   12:25 PM 06/05/2022    4:06 PM 04/30/2022   11:08 AM 11/19/2021    9:54 AM 10/15/2021    2:35 PM  PHQ 2/9 Scores  PHQ - 2 Score 0 0 4 0 2 0 6  PHQ- 9 Score 5 0 7 0 3 0 9    Fall Risk     05/06/2023   11:52 AM 04/15/2023    3:51 PM 12/16/2022   11:51 AM 12/12/2022    9:00 AM 09/22/2022   12:25 PM  Fall Risk   Falls in the past year? 1 0 1 1 1   Comment loses balance      Number falls in past yr: 1 0 1 1 1   Injury with Fall? 0 0 1 1 0  Risk for fall due to : Impaired balance/gait;History of fall(s);Impaired mobility;Medication side effect No Fall Risks History of fall(s)  History of fall(s)  Follow up Falls prevention discussed;Falls evaluation completed Falls evaluation completed Falls  evaluation completed  Falls evaluation completed    MEDICARE RISK AT HOME:  Medicare Risk at Home Any stairs in or around the home?: Yes If so, are there any without handrails?: No Home free of loose throw rugs in walkways, pet beds, electrical cords, etc?: Yes Adequate lighting in your home to reduce risk of falls?: Yes Life alert?: No Use of a cane, walker or w/c?: Yes Grab bars in the bathroom?: Yes Shower chair or bench in shower?: Yes Elevated toilet seat or a handicapped toilet?: No  TIMED UP AND GO:  Was the test performed?  No  Cognitive Function: 6CIT completed    05/15/2022  1:20 PM 09/18/2021    7:46 AM 04/03/2020    1:22 PM  MMSE - Mini Mental State Exam  Orientation to time 4 5 5   Orientation to Place 5 5 5   Registration 2 3 3   Attention/ Calculation 5 5 3   Recall 3 3 2   Language- name 2 objects 2 2 2   Language- repeat 1 1 1   Language- follow 3 step command 3 3 3   Language- read & follow direction 1 1 1   Write a sentence 1 1 1   Copy design 0 1 1  Total score 27 30 27       02/16/2023   12:07 PM  Montreal Cognitive Assessment   Visuospatial/ Executive (0/5) 4  Naming (0/3) 3  Attention: Read list of digits (0/2) 2  Attention: Read list of letters (0/1) 1  Attention: Serial 7 subtraction starting at 100 (0/3) 3  Language: Repeat phrase (0/2) 2  Language : Fluency (0/1) 1  Abstraction (0/2) 2  Delayed Recall (0/5) 4  Orientation (0/6) 6  Total 28      05/06/2023   11:55 AM 04/30/2022   11:20 AM 04/04/2021   10:45 AM 03/28/2020   10:25 AM 03/23/2019   10:33 AM  6CIT Screen  What Year? 0 points 0 points 0 points 0 points 0 points  What month? 0 points 0 points 0 points 0 points 0 points  What time? 0 points 0 points 0 points 0 points 0 points  Count back from 20 0 points 0 points 0 points 0 points 0 points  Months in reverse 0 points 0 points 0 points 0 points 0 points  Repeat phrase 0 points 0 points 0 points 0 points 0 points  Total Score 0 points 0  points 0 points 0 points 0 points    Immunizations Immunization History  Administered Date(s) Administered   Fluad Quad(high Dose 65+) 12/30/2019, 11/08/2020, 10/21/2021   Influenza Split 10/31/2013   Influenza Whole 12/23/2017   Influenza, High Dose Seasonal PF 11/18/2016, 12/21/2017, 11/23/2018   Influenza,inj,Quad PF,6+ Mos 02/25/2013, 03/19/2015   Influenza,inj,quad, With Preservative 11/11/2018   Influenza-Unspecified 11/11/2022   PFIZER(Purple Top)SARS-COV-2 Vaccination 03/01/2019, 03/22/2019, 11/22/2019   PNEUMOCOCCAL CONJUGATE-20 10/07/2022   Pfizer Covid-19 Vaccine Bivalent Booster 45yrs & up 12/28/2020   Pneumococcal Polysaccharide-23 12/21/2017   Tdap 02/25/2022   Zoster Recombinant(Shingrix) 12/25/2017, 06/13/2021    Screening Tests Health Maintenance  Topic Date Due   COVID-19 Vaccine (5 - 2024-25 season) 10/12/2022   FOOT EXAM  11/20/2022   Diabetic kidney evaluation - Urine ACR  06/05/2023   OPHTHALMOLOGY EXAM  05/29/2023   MAMMOGRAM  06/10/2023   HEMOGLOBIN A1C  10/16/2023   Diabetic kidney evaluation - eGFR measurement  04/09/2024   Medicare Annual Wellness (AWV)  05/05/2024   Colonoscopy  01/09/2030   DTaP/Tdap/Td (2 - Td or Tdap) 02/26/2032   Pneumonia Vaccine 59+ Years old  Completed   INFLUENZA VACCINE  Completed   DEXA SCAN  Completed   Hepatitis C Screening  Completed   Zoster Vaccines- Shingrix  Completed   HPV VACCINES  Aged Out    Health Maintenance  Health Maintenance Due  Topic Date Due   COVID-19 Vaccine (5 - 2024-25 season) 10/12/2022   FOOT EXAM  11/20/2022   Diabetic kidney evaluation - Urine ACR  06/05/2023   Health Maintenance Items Addressed: Covid vaccine due  Additional Screening:  Vision Screening: Recommended annual ophthalmology exams for early detection of glaucoma and other disorders of the eye.  Dental  Screening: Recommended annual dental exams for proper oral hygiene  Community Resource Referral / Chronic Care  Management: CRR required this visit?  Yes   CCM required this visit?  No     Plan:     I have personally reviewed and noted the following in the patient's chart:   Medical and social history Use of alcohol, tobacco or illicit drugs  Current medications and supplements including opioid prescriptions. Patient is not currently taking opioid prescriptions. Functional ability and status Nutritional status Physical activity Advanced directives List of other physicians Hospitalizations, surgeries, and ER visits in previous 12 months Vitals Screenings to include cognitive, depression, and falls Referrals and appointments  In addition, I have reviewed and discussed with patient certain preventive protocols, quality metrics, and best practice recommendations. A written personalized care plan for preventive services as well as general preventive health recommendations were provided to patient.     Barb Merino, LPN   1/61/0960   After Visit Summary: (Pick Up) Due to this being a telephonic visit, with patients personalized plan was offered to patient and patient has requested to Pick up at office.  Notes: Please refer to Routing Comments.

## 2023-05-07 ENCOUNTER — Telehealth: Payer: Self-pay | Admitting: *Deleted

## 2023-05-07 NOTE — Progress Notes (Signed)
 Complex Care Management Note Care Guide Note  05/07/2023 Name: Sheila Reynolds MRN: [id number] DOB: 12-06-1945   Complex Care Management Outreach Attempts: An unsuccessful telephone outreach was attempted today to offer the patient information about available complex care management services.  Follow Up Plan:  Additional outreach attempts will be made to offer the patient complex care management information and services.   Encounter Outcome:  No Answer  Gwenevere Ghazi  [facility]  [facility], [facility] Guide  Direct Dial: [phone]  Fax [phone]

## 2023-05-12 DIAGNOSIS — H3581 Retinal edema: Secondary | ICD-10-CM | POA: Diagnosis not present

## 2023-05-12 DIAGNOSIS — H15012 Anterior scleritis, left eye: Secondary | ICD-10-CM | POA: Diagnosis not present

## 2023-05-12 DIAGNOSIS — H2013 Chronic iridocyclitis, bilateral: Secondary | ICD-10-CM | POA: Diagnosis not present

## 2023-05-14 NOTE — Progress Notes (Unsigned)
 Complex Care Management Note Care Guide Note  05/14/2023 Name: Sheila Reynolds MRN: [id number] DOB: 1946-02-06   Complex Care Management Outreach Attempts: A second unsuccessful outreach was attempted today to offer the patient with information about available complex care management services.  Follow Up Plan:  Additional outreach attempts will be made to offer the patient complex care management information and services.   Encounter Outcome:  No Answer  Gwenevere Ghazi  [facility]  [facility], [facility] Guide  Direct Dial: [phone]  Fax [phone]

## 2023-05-15 NOTE — Progress Notes (Signed)
 Complex Care Management Note Care Guide Note  05/15/2023 Name: Sheila Reynolds MRN: [id number] DOB: 1945-03-11   Complex Care Management Outreach Attempts: A third unsuccessful outreach was attempted today to offer the patient with information about available complex care management services.  Follow Up Plan:  No further outreach attempts will be made at this time. We have been unable to contact the patient to offer or enroll patient in complex care management services.  Encounter Outcome:  No Answer  Gwenevere Ghazi  [facility]  [facility], [facility] Guide  Direct Dial: [phone]  Fax [phone]

## 2023-05-18 ENCOUNTER — Telehealth: Payer: Self-pay | Admitting: *Deleted

## 2023-05-18 NOTE — Progress Notes (Signed)
 Complex Care Management Note  Care Guide Note 05/18/2023 Name: Sheila Reynolds MRN: [id number] DOB: [birth year]  Sheila Reynolds is a 78 y.o. year old female who sees Dorothyann Peng, MD for primary care. Jaclin H Evora called by phone today to schedule complex care management services.  Sheila Reynolds was given information about Complex Care Management services today including:   The Complex Care Management services include support from the care team which includes your Nurse Care Manager, Clinical Social Worker, or Pharmacist.  The Complex Care Management team is here to help remove barriers to the health concerns and goals most important to you. Complex Care Management services are voluntary, and the patient may decline or stop services at any time by request to their care team member.   Complex Care Management Consent Status: Patient agreed to services and verbal consent obtained.   Follow up plan:  Telephone appointment with complex care management team member scheduled for:  4/29 with RNCM. Made patient aware I routed referral to community resource care team.  Encounter Outcome:  Patient Scheduled

## 2023-05-18 NOTE — Telephone Encounter (Signed)
 Copied from CRM [id number]. Topic: Appointments - Appointment Cancel/Reschedule >> May 18, 2023  8:50 AM Sheila Reynolds wrote: Patient/patient representative is calling to cancel or reschedule an appointment. Refer to attachments for appointment information.  Patient is requesting a call back. No solutions showing for PFT and appointment with specifically Dr. Lindwood Qua. Please call patient back to reschedule. She does not want to wait til June or July and does not want to see a different provider.    MR- please advise what to do. She is refusing to see APP and wants PFT and ov same day and there is nothing before June. Do you feel it's ok for her to wait?

## 2023-05-19 ENCOUNTER — Telehealth: Payer: Self-pay

## 2023-05-19 ENCOUNTER — Ambulatory Visit: Payer: Medicare Other | Admitting: Internal Medicine

## 2023-05-19 NOTE — Progress Notes (Signed)
 Complex Care Management Note Care Guide Note  05/19/2023 Name: Sheila Reynolds MRN: [id number] DOB: [birth year]  Sheila Reynolds Roll is a 78 y.o. year old female who is a primary care patient of Dorothyann Peng, MD . The community resource team was consulted for assistance with  hearing aids.  SDOH screenings and interventions completed:  Yes  Social Drivers of Health From This Encounter   Food Insecurity: No Food Insecurity (05/19/2023)   Hunger Vital Sign    Worried About Running Out of Food in the Last Year: Never true    Ran Out of Food in the Last Year: Never true  Housing: Low Risk  (05/19/2023)   Housing Stability Vital Sign    Unable to Pay for Housing in the Last Year: No    Number of Times Moved in the Last Year: 0    Homeless in the Last Year: No  Financial Resource Strain: Low Risk  (05/19/2023)   Overall Financial Resource Strain (CARDIA)    Difficulty of Paying Living Expenses: Not hard at all  Transportation Needs: No Transportation Needs (05/19/2023)   PRAPARE - Administrator, Civil Service (Medical): No    Lack of Transportation (Non-Medical): No  Utilities: Not At Risk (05/19/2023)   Utilities    Threatened with loss of utilities: No    SDOH Interventions Today    Flowsheet Row Most Recent Value  SDOH Interventions   Financial Strain Interventions Other (Comment)  [Spoke to patient about [facility] and [employer]. Verified patient's email address to send information.]        Care guide performed the following interventions: Patient provided with information about care guide support team and interviewed to confirm resource needs.  Follow Up Plan:  No further follow up planned at this time. The patient has been provided with needed resources.  Encounter Outcome:  Patient Visit Completed  Kaylon Hitz [facility]  [facility]  Guide Direct Dial: [phone]  Fax: [phone] Website: [email or web address]

## 2023-05-20 ENCOUNTER — Telehealth: Payer: Self-pay | Admitting: *Deleted

## 2023-05-20 NOTE — Telephone Encounter (Signed)
 Copied from CRM [id number]. Topic: General - Call Back - No Documentation >> May 19, 2023  1:53 PM Amalia Hailey F wrote: Reason for CRM: Patient is calling back after being told she was going to receive a call yesterday or today regarding an appointment with provider Dr. Marchelle Gearing, however she stated she never received a call. Patient attempted to reschedule yesterday on 05/18/2023, however there were no solutions for PFT and appointment with specifically Dr. Marchelle Gearing. Patient does not want to wait until June or July for this appointment to be made as she stated she's already waited 3 months. She does not want to see another provider. Phone number [phone].   I called the pt and explained to her that I am waiting for response from MR about her appts. She verbalized understanding. See 05/18/23 telephone encounter.

## 2023-05-25 DIAGNOSIS — Z9181 History of falling: Secondary | ICD-10-CM | POA: Diagnosis not present

## 2023-05-25 DIAGNOSIS — B351 Tinea unguium: Secondary | ICD-10-CM | POA: Diagnosis not present

## 2023-05-25 DIAGNOSIS — L84 Corns and callosities: Secondary | ICD-10-CM | POA: Diagnosis not present

## 2023-05-25 DIAGNOSIS — L851 Acquired keratosis [keratoderma] palmaris et plantaris: Secondary | ICD-10-CM | POA: Diagnosis not present

## 2023-05-25 DIAGNOSIS — E1151 Type 2 diabetes mellitus with diabetic peripheral angiopathy without gangrene: Secondary | ICD-10-CM | POA: Diagnosis not present

## 2023-05-25 DIAGNOSIS — I739 Peripheral vascular disease, unspecified: Secondary | ICD-10-CM | POA: Diagnosis not present

## 2023-05-25 DIAGNOSIS — I70203 Unspecified atherosclerosis of native arteries of extremities, bilateral legs: Secondary | ICD-10-CM | POA: Diagnosis not present

## 2023-05-25 DIAGNOSIS — R262 Difficulty in walking, not elsewhere classified: Secondary | ICD-10-CM | POA: Diagnosis not present

## 2023-05-25 DIAGNOSIS — E785 Hyperlipidemia, unspecified: Secondary | ICD-10-CM | POA: Diagnosis not present

## 2023-05-25 DIAGNOSIS — M2011 Hallux valgus (acquired), right foot: Secondary | ICD-10-CM | POA: Diagnosis not present

## 2023-05-25 DIAGNOSIS — M2012 Hallux valgus (acquired), left foot: Secondary | ICD-10-CM | POA: Diagnosis not present

## 2023-05-26 ENCOUNTER — Encounter (HOSPITAL_BASED_OUTPATIENT_CLINIC_OR_DEPARTMENT_OTHER): Payer: Self-pay

## 2023-05-26 DIAGNOSIS — M0609 Rheumatoid arthritis without rheumatoid factor, multiple sites: Secondary | ICD-10-CM | POA: Diagnosis not present

## 2023-05-26 LAB — HEPATIC FUNCTION PANEL
ALT: 13 IU/L (ref 0–32)
AST: 18 IU/L (ref 0–40)
Albumin: 4.4 g/dL (ref 3.8–4.8)
Alkaline Phosphatase: 90 IU/L (ref 44–121)
Bilirubin Total: 0.4 mg/dL (ref 0.0–1.2)
Bilirubin, Direct: 0.16 mg/dL (ref 0.00–0.40)
Total Protein: 6.6 g/dL (ref 6.0–8.5)

## 2023-05-26 LAB — LIPID PANEL
Chol/HDL Ratio: 2.8 ratio (ref 0.0–4.4)
Cholesterol, Total: 116 mg/dL (ref 100–199)
HDL: 41 mg/dL (ref >39)
LDL Chol Calc (NIH): 61 mg/dL (ref 0–99)
Triglycerides: 64 mg/dL (ref 0–149)
VLDL Cholesterol Cal: 14 mg/dL (ref 5–40)

## 2023-05-28 ENCOUNTER — Telehealth (HOSPITAL_BASED_OUTPATIENT_CLINIC_OR_DEPARTMENT_OTHER): Payer: Self-pay | Admitting: Physical Therapy

## 2023-05-28 NOTE — Telephone Encounter (Signed)
 Called and LVM to remind patient of upcoming physical therapy evaluation appointment. Requested call back to confirm appointment attendance.

## 2023-05-28 NOTE — Telephone Encounter (Signed)
 ly1st avail PFT and then seem is fine 30 min - June/July . If she sis getting worse then APP

## 2023-06-01 ENCOUNTER — Other Ambulatory Visit: Payer: Self-pay

## 2023-06-01 ENCOUNTER — Encounter (HOSPITAL_BASED_OUTPATIENT_CLINIC_OR_DEPARTMENT_OTHER): Payer: Self-pay | Admitting: Physical Therapy

## 2023-06-01 ENCOUNTER — Ambulatory Visit (HOSPITAL_BASED_OUTPATIENT_CLINIC_OR_DEPARTMENT_OTHER): Attending: Cardiovascular Disease | Admitting: Physical Therapy

## 2023-06-01 DIAGNOSIS — R2689 Other abnormalities of gait and mobility: Secondary | ICD-10-CM | POA: Diagnosis not present

## 2023-06-01 DIAGNOSIS — R2681 Unsteadiness on feet: Secondary | ICD-10-CM

## 2023-06-01 DIAGNOSIS — R531 Weakness: Secondary | ICD-10-CM | POA: Insufficient documentation

## 2023-06-01 DIAGNOSIS — R262 Difficulty in walking, not elsewhere classified: Secondary | ICD-10-CM | POA: Diagnosis not present

## 2023-06-01 DIAGNOSIS — M6281 Muscle weakness (generalized): Secondary | ICD-10-CM | POA: Diagnosis not present

## 2023-06-01 NOTE — Therapy (Signed)
 OUTPATIENT PHYSICAL THERAPY LOWER EXTREMITY EVALUATION   Patient Name: Sheila Reynolds MRN: [id number] DOB:December 02, 1945, 78 y.o., female Today's Date: 06/01/2023  END OF SESSION:  PT End of Session - 06/01/23 1246     Visit Number 1    Number of Visits 18    Date for PT Re-Evaluation 08/30/23    Authorization Type MCR    PT Start Time 1020    PT Stop Time 1100    PT Time Calculation (min) 40 min    Activity Tolerance Patient tolerated treatment well    Behavior During Therapy WFL for tasks assessed/performed             Past Medical History:  Diagnosis Date   Anemia    3 months ago anemic   Anxiety    on meds   Arthritis    "all over" (01/15/2017)   Asthma    Bronchitis with emphysema    Chest pain    Chronic bronchitis (HCC)    Coronary artery disease    a. 01/2017 she underwent orbital atherectomy/DES to the proxmal LAD and PTCA to ostial D2. 2D Echo 01/15/17 showed mild LVH, EF 60-65%, grade 1 DD.   Family history of anesthesia complication    daughter N/V   Fibromyalgia    GERD (gastroesophageal reflux disease)    on meds   Glaucoma    Heart murmur    History of hiatal hernia    Hx of echocardiogram    Echo (03/2013):  Tech limited; Mild focal basal septal hypertrophy, EF 60-65%, normal RVF   Hyperlipidemia    Hypertension    Nonsustained ventricular tachycardia (HCC)    OSA on CPAP    Premature atrial contractions    PVC (premature ventricular contraction)    a. Holter 12/16: NSR, occ PAC,PVCs   Sarcoidosis    Type II diabetes mellitus (HCC)    Past Surgical History:  Procedure Laterality Date   BREAST SURGERY     CARDIAC CATHETERIZATION  05/04/2007   reveals overall normal left ventricular systolic function. Ejection fraction 65-70%   CATARACT EXTRACTION W/PHACO Right 07/20/2013   Procedure: CATARACT EXTRACTION PHACO AND INTRAOCULAR LENS PLACEMENT (IOC);  Surgeon: Ben Bracken, MD;  Location[facility] OR;  Service: Ophthalmology;  Laterality: Right;    COLONOSCOPY W/ BIOPSIES AND POLYPECTOMY     COLONOSCOPY WITH PROPOFOL  N/A 09/07/2014   Procedure: COLONOSCOPY WITH PROPOFOL ;  Surgeon: Tami Falcon, MD;  Location: [facility];  Service: Endoscopy;  Laterality: N/A;   COLONOSCOPY WITH PROPOFOL  N/A 01/10/2020   Procedure: COLONOSCOPY WITH PROPOFOL ;  Surgeon: Tami Falcon, MD;  Location: [facility];  Service: Endoscopy;  Laterality: N/A;   CORONARY ANGIOPLASTY WITH STENT PLACEMENT  01/15/2017   CORONARY ATHERECTOMY N/A 01/15/2017   Procedure: CORONARY ATHERECTOMY;  Surgeon: Sammy Crisp, MD;  Location: [facility];  Service: Cardiovascular;  Laterality: N/A;   CORONARY BALLOON ANGIOPLASTY N/A 01/15/2017   Procedure: CORONARY BALLOON ANGIOPLASTY;  Surgeon: Sammy Crisp, MD;  Location: [facility];  Service: Cardiovascular;  Laterality: N/A;   CORONARY PRESSURE/FFR STUDY N/A 12/12/2016   Procedure: INTRAVASCULAR PRESSURE WIRE/FFR STUDY;  Surgeon: Sammy Crisp, MD;  Location: [facility];  Service: Cardiovascular;  Laterality: N/A;   CORONARY STENT INTERVENTION N/A 01/15/2017   Procedure: CORONARY STENT INTERVENTION;  Surgeon: Sammy Crisp, MD;  Location: [facility];  Service: Cardiovascular;  Laterality: N/A;   ESOPHAGOGASTRODUODENOSCOPY (EGD) WITH PROPOFOL  N/A 09/07/2014   Procedure: ESOPHAGOGASTRODUODENOSCOPY (EGD) WITH PROPOFOL ;  Surgeon:  Tami Falcon, MD;  Location: [facility];  Service: Endoscopy;  Laterality: N/A;   EXTERNAL EAR SURGERY Bilateral 1970s   tumors removed   EYE SURGERY Left 2019   cataract extraction    EYE SURGERY Right 02/09/2018   eyelid surgery    MINI SHUNT INSERTION Right 07/20/2013   Procedure: INSERTION OF GLAUCOMA FILTRATION DEVICE RIGHT EYE;  Surgeon: Ben Bracken, MD;  Location: [facility] OR;  Service: Ophthalmology;  Laterality: Right;   MITOMYCIN  C APPLICATION Right 07/20/2013   Procedure: MITOMYCIN  C APPLICATION;  Surgeon: Ben Bracken, MD;  Location: [facility] OR;  Service:  Ophthalmology;  Laterality: Right;   MITOMYCIN  C APPLICATION Right 02/21/2015   Procedure: MITOMYCIN  C APPLICATION RIGHT EYE;  Surgeon: Ben Bracken, MD;  Location: [facility] OR;  Service: Ophthalmology;  Laterality: Right;   PLACEMENT OF BREAST IMPLANTS Bilateral 1992   "took all my breast tissue out; put implants in;fibrocystic breast disease "   POLYPECTOMY  01/10/2020   Procedure: POLYPECTOMY;  Surgeon: Tami Falcon, MD;  Location: [facility];  Service: Endoscopy;;   RIGHT/LEFT HEART CATH AND CORONARY ANGIOGRAPHY N/A 12/12/2016   Procedure: RIGHT/LEFT HEART CATH AND CORONARY ANGIOGRAPHY;  Surgeon: Sammy Crisp, MD;  Location: [facility];  Service: Cardiovascular;  Laterality: N/A;   TONSILLECTOMY     TOTAL ABDOMINAL HYSTERECTOMY  1982   TRABECULECTOMY Right 02/21/2015   Procedure: TRABECULECTOMY WITH [facility] ON THE RIGHT EYE;  Surgeon: Ben Bracken, MD;  Location: [facility] OR;  Service: Ophthalmology;  Laterality: Right;   Patient Active Problem List   Diagnosis Date Noted   Myalgia 04/28/2023   Near syncope 04/15/2023   Viral gastroenteritis 04/15/2023   Postconcussion syndrome 12/16/2022   Syncope 12/03/2022   Acute lower UTI 12/03/2022   Cavitating mass in right upper lung lobe 12/03/2022   Dyslipidemia 12/03/2022   Coronary artery disease 12/03/2022   Chronic obstructive pulmonary disease (COPD) (HCC) 12/03/2022   Cavitary pneumonia 12/03/2022   Atherosclerosis of aorta (HCC) 10/05/2022   Left leg pain 10/05/2022   Pain of toe of left foot 10/05/2022   Current moderate episode of major depressive disorder, unspecified whether recurrent (HCC) 09/22/2022   Abdominal hernia 05/15/2022   Candidal vulvovaginitis 05/15/2022   Fracture of foot 05/15/2022   Hearing loss 05/15/2022   Hypertensive heart disease without heart failure 05/15/2022   Menopausal syndrome 05/15/2022   Other long term (current) drug therapy 05/15/2022   Postherpetic trigeminal neuralgia 05/15/2022   Swollen  abdomen 05/15/2022   Type 2 diabetes mellitus with hyperglycemia (HCC) 05/15/2022   Urinary incontinence 05/15/2022   Partial symptomatic epilepsy with complex partial seizures, not intractable, without status epilepticus (HCC) 05/15/2022   Diabetic polyneuropathy associated with type 2 diabetes mellitus (HCC) 12/15/2021   Oxygen  dependent 12/15/2021   History of fall 12/15/2021   Chronic arthritis 12/15/2021   Dyslipidemia associated with type 2 diabetes mellitus (HCC) 11/05/2021   Generalized weakness 11/05/2021   Pneumonitis 11/05/2021   Postnasal drip 10/18/2021   Low hemoglobin 10/18/2021   Positive ANA (antinuclear antibody) 10/18/2021   BMI 29.0-29.9,adult 10/18/2021   IPF (idiopathic pulmonary fibrosis) (HCC) 08/20/2021   Urinary frequency 06/19/2021   Urinary tract infection without hematuria 06/19/2021   Imbalance 06/19/2021   Memory changes 06/19/2021   Drug therapy 06/19/2021   Back pain 05/24/2020   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 heart failure (HCC) 04/16/2020   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) (HCC) 04/13/2020   Chronic diastolic heart failure (HCC) 03/28/2020  Uncontrolled type 2 diabetes mellitus with hyperglycemia (HCC) 07/25/2019   Hiatal hernia 07/25/2019   Spontaneous ecchymoses 07/25/2019   Class 1 obesity due to excess calories with serious comorbidity and body mass index (BMI) of 30.0 to 30.9 in adult 07/25/2019   Peripheral vascular disease, unspecified (HCC) 03/23/2019   Uveitis 06/01/2017   Fibromyalgia 06/01/2017   DDD (degenerative disc disease), lumbar 06/01/2017   DDD (degenerative disc disease), thoracic 06/01/2017   History of hypercholesterolemia 06/01/2017   History of anxiety 06/01/2017   History of IBS 06/01/2017   Premature atrial contractions 03/30/2017   PVC's (premature ventricular contractions) 03/30/2017   Diabetes mellitus without complication (HCC) 01/28/2017   Angina  pectoris (HCC) 01/15/2017   CAD (coronary artery disease) 01/12/2017   Shortness of breath 12/12/2016   Nuclear sclerotic cataract of left eye 11/16/2015   Status post eye surgery 11/16/2015   Primary open angle glaucoma of left eye, moderate stage 10/31/2015   Chronic iritis of both eyes 08/22/2015   Cystoid macular edema of right eye 08/22/2015   Acute maxillary sinusitis 05/03/2014   Obstructive sleep apnea 11/04/2013   Multiple lung nodules 07/18/2012   Heart palpitations 03/11/2012   HTN (hypertension) 03/11/2012   Unspecified glaucoma 08/29/2011   Gastroesophageal reflux disease without esophagitis 07/13/2011   Sarcoidosis 02/08/2011   COPD with acute exacerbation (HCC) 02/05/2011   Hyperlipemia 09/24/2010   Atypical chest pain 09/24/2010   Leg edema 09/24/2010    PCP: Cleave Curling, MD   REFERRING PROVIDER:  Maudine Sos, MD       REFERRING DIAG:   R53.1 (ICD-10-CM) - Weakness  R26.89 (ICD-10-CM) - Balance problem    THERAPY DIAG:  Other abnormalities of gait and mobility  Unsteadiness on feet  Difficulty walking  Muscle weakness (generalized)  Rationale for Evaluation and Treatment: Rehabilitation  ONSET DATE: 2024  SUBJECTIVE:   SUBJECTIVE STATEMENT: goes by "Vee"  Pt reports she was doing therapy about a year ago due to falls and balance issues. She has issues with bending down due to falling. Pt feels stiffness in the legs that limits ability to lift feet up. Pt reports history of blacking out and passing out. She has not had an episode of 6 months. Pt feels lightheaded and "something is wrong" before she passes out. She  does feel this consisently each of the 3x times she passes out. Has a history of 4 mini-strokes. Pt feels she looses her balance a couple times day. She feels like she tips forward. Pt does have bilat hip and knee pain. Pt feels that back pain will cause her to chest pain, happens with long term standing. Pt does have neuropathy  in both feet but is controlled. Pt does endorse catching her feet on the ground and difficulty with uneven surfaces. Pt denies lightheadedness or room spinning sensations.   PERTINENT HISTORY: Sarcoidosis, headaches, NSVT, HLD, leg edema, HTN, OSA, CAD, angina, DM2, PACs, PVCs, Fibromyalgia, PVD, chronic diastolic heart failure, recent diagnosis of pulmonary fibrosis  PAIN:  Are you having pain? Yes: NPRS scale: 4/10 Pain location: chest, back, bilat knees, and hips, and hands  Pain description: aching, sharp Aggravating factors: movement, standing, leaning fwd  Relieving factors: meds   PRECAUTIONS: Falls  RED FLAGS: None   WEIGHT BEARING RESTRICTIONS: No  FALLS:  Has patient fallen in last 6 months? No, lots of near stumbles  LIVING ENVIRONMENT: Lives with: lives with their spouse Lives in: House/apartment Stairs: Yes  Has following equipment at home: [facility]  OCCUPATION: N/A  PLOF: Independent with basic ADLs and Independent with household mobility with device  PATIENT GOALS: improve her balance and walking, prevent falls; play her great grandson, be able to drive   OBJECTIVE:  Note: Objective measures were completed at Evaluation unless otherwise noted.  DIAGNOSTIC FINDINGS: N/A   BP seated R arm:  119/79  77 HR  FUNCTIONAL OUTCOME MEASURE:   Total ABC score: 500 / 1600 = 31.3 %    Berg Balance Scale = 28  Interpretation: This score is associated with almost 100% fall risk while the patient at the moment may be requiring assistance in performing certain activities of daily living such as walking.    LE  ROM:  LE WFL for tasks assessed; limited and stiff in bilateral hip and knee flexion and ext   LOWER EXTREMITY MMT:   MMT Right eval Left eval  Hip flexion 4/5 4/5  Hip extension      Hip abduction 4/5 4/5  Hip adduction      Hip internal rotation      Hip external rotation      Knee flexion      Knee extension 4/5 4/5  Ankle dorsiflexion      Ankle  plantarflexion      Ankle inversion      Ankle eversion       (Blank rows = not tested)     GAIT: shortened step length; shuffling gait, fwd flexed posture (pt reports leaving cane at home)     STS: Requires UE for stand and sit  Unable to perform 5XSTS   TREATMENT   4/21  Exercises - Sit to Stand with Counter Support  - 1 x daily - 7 x weekly - 3 sets - 10 reps - Standing Heel Raises  - 1 x daily - 7 x weekly - 3 sets - 10 reps - Mini Squat with Counter Support  - 1 x daily - 7 x weekly - 3 sets - 10 reps       PATIENT EDUCATION:  Education details: diagnosis, prognosis, anatomy, exercise progression, DOMS expectations, muscle firing,  envelope of function, HEP, POC   Person educated: Patient Education method: Explanation, demonstration Education comprehension: verbalized understanding, returned demonstration   HOME EXERCISE PROGRAM:  Access Code: [email or web address] URL: [email or web address] Date: 06/01/2023 Prepared by: Silver Dross   ASSESSMENT:   CLINICAL IMPRESSION:  Patient is a 78 y.o. female who was seen today for physical therapy evaluation and treatment for c/c of LBP and balance deficits. Pt's LBP is consistent with history of mechanical LBP and LE strength/balance deficits. Given pt's Berg and ABC outcomes, pt is at significant risk for falls at this time. Plan to continue with lumbopelvic strength and ROM and dynamic balance at future sessions. Pt would benefit from continued skilled therapy in order to reach goals and maximize functional lumbopelvic strength and mobility for full return to safe community ambulation and falls prevention.       OBJECTIVE IMPAIRMENTS: Abnormal gait, cardiopulmonary status limiting activity, decreased activity tolerance, decreased balance, decreased endurance, decreased mobility, difficulty walking, decreased ROM, decreased strength, hypomobility, impaired flexibility, improper body mechanics, postural dysfunction, and pain.     ACTIVITY LIMITATIONS: carrying, lifting, bending, squatting, stairs, transfers, and locomotion level   PARTICIPATION LIMITATIONS: cleaning, laundry, and community activity   PERSONAL FACTORS: Age, Behavior pattern, Fitness, Time since onset of injury/illness/exacerbation, and 1-2 comorbidities:    are also affecting patient's functional outcome.    REHAB POTENTIAL: Fair  CLINICAL DECISION MAKING: Evolving/moderate complexity   EVALUATION COMPLEXITY: Moderate     GOALS:   SHORT TERM GOALS: Target date:  07/13/2023    Pt will become independent with HEP in order to demonstrate synthesis of PT education.   Baseline: Goal status: INITIAL     2.  Pt will report at least 2 pt reduction on NPRS scale for pain in order to demonstrate functional improvement with household activity, self care, and ADL.   Baseline:  Goal status: INITIAL       LONG TERM GOALS: Target date: POC Date   Pt  will become independent with final HEP in order to demonstrate synthesis of PT education.  Baseline:  Goal status: INITIAL   2.  Pt will be able to demonstrate/report ability to walk >10 mins without pain in order to demonstrate functional improvement and tolerance to exercise and community mobility.    Goal status: INITIAL   3. Pt will decrease 5XSTS by at least 3 seconds in order to demonstrate clinically significant improvement in LE strength   Baseline:  Goal status: INITIAL   4.  Pt will have an at least 45/56 on Berg Balance scale in order to demonstrate improvement above cut off score for risk of falls.     Baseline:  Goal status: INITIAL  5. Pt will be able to demonstrate at least 67% confidence on ABC scale in order to demonstrate meaningful improvement in balance confidence above cut off score for older adults.  Baseline:  Goal status: INITIAL       PLAN:   PT FREQUENCY: 1-2x/week   PT DURATION: 12 wks (plan to DC in 8-10 with HEP)    PLANNED INTERVENTIONS: Therapeutic  exercises, Therapeutic activity, Neuromuscular re-education, Balance training, Gait training, Patient/Family education, Self Care, Joint mobilization, Stair training, DME instructions, Cryotherapy, Moist heat, Taping, Manual therapy, and Re-evaluation   PLAN FOR NEXT SESSION: dynamic gait/balance, lumbar/hip strength and ROM     Silver Dross, PT 06/01/2023, 12:56 PM

## 2023-06-01 NOTE — Telephone Encounter (Signed)
 I called and spoke with the pt and have scheduled her appts  Nothing further needed

## 2023-06-08 ENCOUNTER — Encounter (HOSPITAL_BASED_OUTPATIENT_CLINIC_OR_DEPARTMENT_OTHER): Payer: Self-pay | Admitting: Physical Therapy

## 2023-06-08 ENCOUNTER — Other Ambulatory Visit: Payer: Self-pay | Admitting: Internal Medicine

## 2023-06-08 ENCOUNTER — Ambulatory Visit (HOSPITAL_BASED_OUTPATIENT_CLINIC_OR_DEPARTMENT_OTHER): Admitting: Physical Therapy

## 2023-06-08 DIAGNOSIS — R2681 Unsteadiness on feet: Secondary | ICD-10-CM

## 2023-06-08 DIAGNOSIS — R2689 Other abnormalities of gait and mobility: Secondary | ICD-10-CM | POA: Diagnosis not present

## 2023-06-08 DIAGNOSIS — M6281 Muscle weakness (generalized): Secondary | ICD-10-CM | POA: Diagnosis not present

## 2023-06-08 DIAGNOSIS — R531 Weakness: Secondary | ICD-10-CM | POA: Diagnosis not present

## 2023-06-08 DIAGNOSIS — R262 Difficulty in walking, not elsewhere classified: Secondary | ICD-10-CM | POA: Diagnosis not present

## 2023-06-08 NOTE — Therapy (Signed)
 OUTPATIENT PHYSICAL THERAPY LOWER EXTREMITY EVALUATION   Patient Name: Sheila Reynolds MRN: [id number] DOB:[birth year], 78 y.o., female Today's Date: 06/08/2023  END OF SESSION:  PT End of Session - 06/08/23 1057     Visit Number 2    Number of Visits 18    Date for PT Re-Evaluation 08/30/23    Authorization Type MCR    PT Start Time 1015    PT Stop Time 1056    PT Time Calculation (min) 41 min    Activity Tolerance Patient tolerated treatment well    Behavior During Therapy WFL for tasks assessed/performed              Past Medical History:  Diagnosis Date   Anemia    3 months ago anemic   Anxiety    on meds   Arthritis    "all over" (01/15/2017)   Asthma    Bronchitis with emphysema    Chest pain    Chronic bronchitis (HCC)    Coronary artery disease    a. 01/2017 she underwent orbital atherectomy/DES to the proxmal LAD and PTCA to ostial D2. 2D Echo 01/15/17 showed mild LVH, EF 60-65%, grade 1 DD.   Family history of anesthesia complication    daughter N/V   Fibromyalgia    GERD (gastroesophageal reflux disease)    on meds   Glaucoma    Heart murmur    History of hiatal hernia    Hx of echocardiogram    Echo (03/2013):  Tech limited; Mild focal basal septal hypertrophy, EF 60-65%, normal RVF   Hyperlipidemia    Hypertension    Nonsustained ventricular tachycardia (HCC)    OSA on CPAP    Premature atrial contractions    PVC (premature ventricular contraction)    a. Holter 12/16: NSR, occ PAC,PVCs   Sarcoidosis    Type II diabetes mellitus (HCC)    Past Surgical History:  Procedure Laterality Date   BREAST SURGERY     CARDIAC CATHETERIZATION  05/04/2007   reveals overall normal left ventricular systolic function. Ejection fraction 65-70%   CATARACT EXTRACTION W/PHACO Right 07/20/2013   Procedure: CATARACT EXTRACTION PHACO AND INTRAOCULAR LENS PLACEMENT (IOC);  Surgeon: Ben Bracken, MD;  Location: [facility] OR;  Service: Ophthalmology;  Laterality: Right;    COLONOSCOPY W/ BIOPSIES AND POLYPECTOMY     COLONOSCOPY WITH PROPOFOL  N/A 09/07/2014   Procedure: COLONOSCOPY WITH PROPOFOL ;  Surgeon: Tami Falcon, MD;  Location: [facility];  Service: Endoscopy;  Laterality: N/A;   COLONOSCOPY WITH PROPOFOL  N/A 01/10/2020   Procedure: COLONOSCOPY WITH PROPOFOL ;  Surgeon: Tami Falcon, MD;  Location: [facility];  Service: Endoscopy;  Laterality: N/A;   CORONARY ANGIOPLASTY WITH STENT PLACEMENT  01/15/2017   CORONARY ATHERECTOMY N/A 01/15/2017   Procedure: CORONARY ATHERECTOMY;  Surgeon: Sammy Crisp, MD;  Location: [facility];  Service: Cardiovascular;  Laterality: N/A;   CORONARY BALLOON ANGIOPLASTY N/A 01/15/2017   Procedure: CORONARY BALLOON ANGIOPLASTY;  Surgeon: Sammy Crisp, MD;  Location: [facility];  Service: Cardiovascular;  Laterality: N/A;   CORONARY PRESSURE/FFR STUDY N/A 12/12/2016   Procedure: INTRAVASCULAR PRESSURE WIRE/FFR STUDY;  Surgeon: Sammy Crisp, MD;  Location: [facility];  Service: Cardiovascular;  Laterality: N/A;   CORONARY STENT INTERVENTION N/A 01/15/2017   Procedure: CORONARY STENT INTERVENTION;  Surgeon: Sammy Crisp, MD;  Location: [facility];  Service: Cardiovascular;  Laterality: N/A;   ESOPHAGOGASTRODUODENOSCOPY (EGD) WITH PROPOFOL  N/A 09/07/2014   Procedure: ESOPHAGOGASTRODUODENOSCOPY (EGD) WITH PROPOFOL ;  Surgeon: Tami Falcon, MD;  Location: [facility];  Service: Endoscopy;  Laterality: N/A;   EXTERNAL EAR SURGERY Bilateral 1970s   tumors removed   EYE SURGERY Left 2019   cataract extraction    EYE SURGERY Right 02/09/2018   eyelid surgery    MINI SHUNT INSERTION Right 07/20/2013   Procedure: INSERTION OF GLAUCOMA FILTRATION DEVICE RIGHT EYE;  Surgeon: Ben Bracken, MD;  Location[facility] OR;  Service: Ophthalmology;  Laterality: Right;   MITOMYCIN  C APPLICATION Right 07/20/2013   Procedure: MITOMYCIN  C APPLICATION;  Surgeon: Ben Bracken, MD;  Location: [facility] OR;  Service:  Ophthalmology;  Laterality: Right;   MITOMYCIN  C APPLICATION Right 02/21/2015   Procedure: MITOMYCIN  C APPLICATION RIGHT EYE;  Surgeon: Ben Bracken, MD;  Location: [facility] OR;  Service: Ophthalmology;  Laterality: Right;   PLACEMENT OF BREAST IMPLANTS Bilateral 1992   "took all my breast tissue out; put implants in;fibrocystic breast disease "   POLYPECTOMY  01/10/2020   Procedure: POLYPECTOMY;  Surgeon: Tami Falcon, MD;  Location: [facility];  Service: Endoscopy;;   RIGHT/LEFT HEART CATH AND CORONARY ANGIOGRAPHY N/A 12/12/2016   Procedure: RIGHT/LEFT HEART CATH AND CORONARY ANGIOGRAPHY;  Surgeon: Sammy Crisp, MD;  Location: [facility];  Service: Cardiovascular;  Laterality: N/A;   TONSILLECTOMY     TOTAL ABDOMINAL HYSTERECTOMY  1982   TRABECULECTOMY Right 02/21/2015   Procedure: TRABECULECTOMY WITH [facility] ON THE RIGHT EYE;  Surgeon: Ben Bracken, MD;  Location: [facility] OR;  Service: Ophthalmology;  Laterality: Right;   Patient Active Problem List   Diagnosis Date Noted   Myalgia 04/28/2023   Near syncope 04/15/2023   Viral gastroenteritis 04/15/2023   Postconcussion syndrome 12/16/2022   Syncope 12/03/2022   Acute lower UTI 12/03/2022   Cavitating mass in right upper lung lobe 12/03/2022   Dyslipidemia 12/03/2022   Coronary artery disease 12/03/2022   Chronic obstructive pulmonary disease (COPD) (HCC) 12/03/2022   Cavitary pneumonia 12/03/2022   Atherosclerosis of aorta (HCC) 10/05/2022   Left leg pain 10/05/2022   Pain of toe of left foot 10/05/2022   Current moderate episode of major depressive disorder, unspecified whether recurrent (HCC) 09/22/2022   Abdominal hernia 05/15/2022   Candidal vulvovaginitis 05/15/2022   Fracture of foot 05/15/2022   Hearing loss 05/15/2022   Hypertensive heart disease without heart failure 05/15/2022   Menopausal syndrome 05/15/2022   Other long term (current) drug therapy 05/15/2022   Postherpetic trigeminal neuralgia 05/15/2022   Swollen  abdomen 05/15/2022   Type 2 diabetes mellitus with hyperglycemia (HCC) 05/15/2022   Urinary incontinence 05/15/2022   Partial symptomatic epilepsy with complex partial seizures, not intractable, without status epilepticus (HCC) 05/15/2022   Diabetic polyneuropathy associated with type 2 diabetes mellitus (HCC) 12/15/2021   Oxygen  dependent 12/15/2021   History of fall 12/15/2021   Chronic arthritis 12/15/2021   Dyslipidemia associated with type 2 diabetes mellitus (HCC) 11/05/2021   Generalized weakness 11/05/2021   Pneumonitis 11/05/2021   Postnasal drip 10/18/2021   Low hemoglobin 10/18/2021   Positive ANA (antinuclear antibody) 10/18/2021   BMI 29.0-29.9,adult 10/18/2021   IPF (idiopathic pulmonary fibrosis) (HCC) 08/20/2021   Urinary frequency 06/19/2021   Urinary tract infection without hematuria 06/19/2021   Imbalance 06/19/2021   Memory changes 06/19/2021   Drug therapy 06/19/2021   Back pain 05/24/2020   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 heart failure (HCC) 04/16/2020   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) (HCC) 04/13/2020   Chronic diastolic heart failure (HCC) 03/28/2020  Uncontrolled type 2 diabetes mellitus with hyperglycemia (HCC) 07/25/2019   Hiatal hernia 07/25/2019   Spontaneous ecchymoses 07/25/2019   Class 1 obesity due to excess calories with serious comorbidity and body mass index (BMI) of 30.0 to 30.9 in adult 07/25/2019   Peripheral vascular disease, unspecified (HCC) 03/23/2019   Uveitis 06/01/2017   Fibromyalgia 06/01/2017   DDD (degenerative disc disease), lumbar 06/01/2017   DDD (degenerative disc disease), thoracic 06/01/2017   History of hypercholesterolemia 06/01/2017   History of anxiety 06/01/2017   History of IBS 06/01/2017   Premature atrial contractions 03/30/2017   PVC's (premature ventricular contractions) 03/30/2017   Diabetes mellitus without complication (HCC) 01/28/2017   Angina  pectoris (HCC) 01/15/2017   CAD (coronary artery disease) 01/12/2017   Shortness of breath 12/12/2016   Nuclear sclerotic cataract of left eye 11/16/2015   Status post eye surgery 11/16/2015   Primary open angle glaucoma of left eye, moderate stage 10/31/2015   Chronic iritis of both eyes 08/22/2015   Cystoid macular edema of right eye 08/22/2015   Acute maxillary sinusitis 05/03/2014   Obstructive sleep apnea 11/04/2013   Multiple lung nodules 07/18/2012   Heart palpitations 03/11/2012   HTN (hypertension) 03/11/2012   Unspecified glaucoma 08/29/2011   Gastroesophageal reflux disease without esophagitis 07/13/2011   Sarcoidosis 02/08/2011   COPD with acute exacerbation (HCC) 02/05/2011   Hyperlipemia 09/24/2010   Atypical chest pain 09/24/2010   Leg edema 09/24/2010    PCP: Cleave Curling, MD   REFERRING PROVIDER:  Maudine Sos, MD       REFERRING DIAG:   R53.1 (ICD-10-CM) - Weakness  R26.89 (ICD-10-CM) - Balance problem    THERAPY DIAG:  Other abnormalities of gait and mobility  Unsteadiness on feet  Difficulty walking  Muscle weakness (generalized)  Rationale for Evaluation and Treatment: Rehabilitation  ONSET DATE: 2024  SUBJECTIVE:   SUBJECTIVE STATEMENT: goes by "Vee"  Pt reports no issues with last session Has been compliant with HEP. No pain and dizziness/lightheadedness since last time.   Eval:   Pt reports she was doing therapy about a year ago due to falls and balance issues. She has issues with bending down due to falling. Pt feels stiffness in the legs that limits ability to lift feet up. Pt reports history of blacking out and passing out. She has not had an episode of 6 months. Pt feels lightheaded and "something is wrong" before she passes out. She  does feel this consisently each of the 3x times she passes out. Has a history of 4 mini-strokes. Pt feels she looses her balance a couple times day. She feels like she tips forward. Pt does  have bilat hip and knee pain. Pt feels that back pain will cause her to chest pain, happens with long term standing. Pt does have neuropathy in both feet but is controlled. Pt does endorse catching her feet on the ground and difficulty with uneven surfaces. Pt denies lightheadedness or room spinning sensations.   PERTINENT HISTORY: Sarcoidosis, headaches, NSVT, HLD, leg edema, HTN, OSA, CAD, angina, DM2, PACs, PVCs, Fibromyalgia, PVD, chronic diastolic heart failure, recent diagnosis of pulmonary fibrosis  PAIN:  Are you having pain? Yes: NPRS scale: 4/10 Pain location: chest, back, bilat knees, and hips, and hands  Pain description: aching, sharp Aggravating factors: movement, standing, leaning fwd  Relieving factors: meds   PRECAUTIONS: Falls  RED FLAGS: None   WEIGHT BEARING RESTRICTIONS: No  FALLS:  Has patient fallen in last 6 months? No, lots of  near stumbles  LIVING ENVIRONMENT: Lives with: lives with their spouse Lives in: House/apartment Stairs: Yes  Has following equipment at home: [facility]  OCCUPATION: N/A  PLOF: Independent with basic ADLs and Independent with household mobility with device  PATIENT GOALS: improve her balance and walking, prevent falls; play her great grandson, be able to drive   OBJECTIVE:  Note: Objective measures were completed at Evaluation unless otherwise noted.  DIAGNOSTIC FINDINGS: N/A   BP seated R arm:  119/79  77 HR  FUNCTIONAL OUTCOME MEASURE:   Total ABC score: 500 / 1600 = 31.3 %    Berg Balance Scale = 28  Interpretation: This score is associated with almost 100% fall risk while the patient at the moment may be requiring assistance in performing certain activities of daily living such as walking.    LE  ROM:  LE WFL for tasks assessed; limited and stiff in bilateral hip and knee flexion and ext   LOWER EXTREMITY MMT:   MMT Right eval Left eval  Hip flexion 4/5 4/5  Hip extension      Hip abduction 4/5 4/5  Hip  adduction      Hip internal rotation      Hip external rotation      Knee flexion      Knee extension 4/5 4/5  Ankle dorsiflexion      Ankle plantarflexion      Ankle inversion      Ankle eversion       (Blank rows = not tested)     GAIT: shortened step length; shuffling gait, fwd flexed posture (pt reports leaving cane at home)     STS: Requires UE for stand and sit  Unable to perform 5XSTS   TREATMENT   4/28 Nustep warm up 5 min lvl 1  STS 3x6 Transfer mechanics safety Sidestep at table 4x  Retro step 4x at table NBOS  airex EC 30s 4x Fwd step up on airex with focus on knee flexion and foot clearance 2x10 ea  HEP updates and modifications; posture effects on balance and COG over BOS   4/21  Exercises - Sit to Stand with Counter Support  - 1 x daily - 7 x weekly - 3 sets - 10 reps - Standing Heel Raises  - 1 x daily - 7 x weekly - 3 sets - 10 reps - Mini Squat with Counter Support  - 1 x daily - 7 x weekly - 3 sets - 10 reps       PATIENT EDUCATION:  Education details: diagnosis, prognosis, anatomy, exercise progression, DOMS expectations, muscle firing,  envelope of function, HEP, POC   Person educated: Patient Education method: Explanation, demonstration Education comprehension: verbalized understanding, returned demonstration   HOME EXERCISE PROGRAM:  Access Code: [email or web address] URL: [email or web address] Date: 06/01/2023 Prepared by: Silver Dross   ASSESSMENT:   CLINICAL IMPRESSION:  Pt able to start progressive standing exercise with intermittent UE support and SBA. Pt does have postural sway in EC as expected but was able to tolerance unstable surface training with minimal to no LOB with SBA. HEP updated today to include retro and lateral movement at home with UE support. Pt able to return demo edu about transfer mechanics and posture with balance but will likely need review at next as pt tends to leave COG behind during transfers and has trunk  flexion in standing. Pt would benefit from continued skilled therapy in order to reach goals and maximize functional lumbopelvic strength and mobility  for full return to safe community ambulation and falls prevention.       OBJECTIVE IMPAIRMENTS: Abnormal gait, cardiopulmonary status limiting activity, decreased activity tolerance, decreased balance, decreased endurance, decreased mobility, difficulty walking, decreased ROM, decreased strength, hypomobility, impaired flexibility, improper body mechanics, postural dysfunction, and pain.    ACTIVITY LIMITATIONS: carrying, lifting, bending, squatting, stairs, transfers, and locomotion level   PARTICIPATION LIMITATIONS: cleaning, laundry, and community activity   PERSONAL FACTORS: Age, Behavior pattern, Fitness, Time since onset of injury/illness/exacerbation, and 1-2 comorbidities:    are also affecting patient's functional outcome.    REHAB POTENTIAL: Fair     CLINICAL DECISION MAKING: Evolving/moderate complexity   EVALUATION COMPLEXITY: Moderate     GOALS:   SHORT TERM GOALS: Target date:  07/13/2023    Pt will become independent with HEP in order to demonstrate synthesis of PT education.   Baseline: Goal status: INITIAL     2.  Pt will report at least 2 pt reduction on NPRS scale for pain in order to demonstrate functional improvement with household activity, self care, and ADL.   Baseline:  Goal status: INITIAL       LONG TERM GOALS: Target date: POC Date   Pt  will become independent with final HEP in order to demonstrate synthesis of PT education.  Baseline:  Goal status: INITIAL   2.  Pt will be able to demonstrate/report ability to walk >10 mins without pain in order to demonstrate functional improvement and tolerance to exercise and community mobility.    Goal status: INITIAL   3. Pt will decrease 5XSTS by at least 3 seconds in order to demonstrate clinically significant improvement in LE strength   Baseline:   Goal status: INITIAL   4.  Pt will have an at least 45/56 on Berg Balance scale in order to demonstrate improvement above cut off score for risk of falls.     Baseline:  Goal status: INITIAL  5. Pt will be able to demonstrate at least 67% confidence on ABC scale in order to demonstrate meaningful improvement in balance confidence above cut off score for older adults.  Baseline:  Goal status: INITIAL       PLAN:   PT FREQUENCY: 1-2x/week   PT DURATION: 12 wks (plan to DC in 8-10 with HEP)    PLANNED INTERVENTIONS: Therapeutic exercises, Therapeutic activity, Neuromuscular re-education, Balance training, Gait training, Patient/Family education, Self Care, Joint mobilization, Stair training, DME instructions, Cryotherapy, Moist heat, Taping, Manual therapy, and Re-evaluation   PLAN FOR NEXT SESSION: dynamic gait/balance, lumbar/hip strength and ROM     Silver Dross, PT 06/08/2023, 11:00 AM

## 2023-06-09 ENCOUNTER — Other Ambulatory Visit: Payer: Self-pay

## 2023-06-09 DIAGNOSIS — J849 Interstitial pulmonary disease, unspecified: Secondary | ICD-10-CM | POA: Insufficient documentation

## 2023-06-09 NOTE — Patient Instructions (Addendum)
 Visit Information  Thank you for taking time to visit with me today. Please don't hesitate to contact me if I can be of assistance to you before our next scheduled appointment.  Our next appointment is by telephone on Friday, May 23 at 09:00 AM Please call the care guide team at [phone] if you need to cancel or reschedule your appointment.   Following is a copy of your care plan:   Goals Addressed             This Visit's Progress    VBCI RN Care Plan related to COPD       Problems:  Chronic Disease Management support and education needs related to COPD  Goal: Over the next 90 days the Patient will continue to work with RN Care Manager and/or Social Worker to address care management and care coordination needs related to COPD as evidenced by adherence to care management team scheduled appointments      Interventions:   COPD Interventions: Provided patient with basic written and verbal COPD education on self care/management/and exacerbation prevention Advised patient to track and manage COPD triggers Provided instruction about proper use of medications used for management of COPD including inhalers Provided education about and advised patient to utilize infection prevention strategies to reduce risk of respiratory infection Discussed the importance of adequate rest and management of fatigue with COPD Assessed social determinant of health barriers  Patient Self-Care Activities:  Attend all scheduled provider appointments Call pharmacy for medication refills 3-7 days in advance of running out of medications Call provider office for new concerns or questions  Take medications as prescribed   Work with the nurse care manager to address care coordination needs and will continue to work with the clinical team to address health care and disease management related needs develop a rescue plan eliminate symptom triggers at home follow rescue plan if symptoms flare-up keep follow-up  appointments: Pulmonary Function Testing scheduled at [facility] on Monday, May 19 at 09:30 AM  Plan:  Follow up with provider re: PFT on Monday, May 19 at 09:30 AM Telephone follow up appointment with care management team member scheduled for:  Friday, May 23 at 09:00 AM             Please call 1-800-273-TALK (toll free, 24 hour hotline) if you are experiencing a Mental Health or Behavioral Health Crisis or need someone to talk to.  Patient verbalizes understanding of instructions and care plan provided today and agrees to view in MyChart. Active MyChart status and patient understanding of how to access instructions and care plan via MyChart confirmed with patient.     Louanne Roussel RN BSN CCM [place]  [facility], [facility] Nurse Care Coordinator  Direct Dial: [phone] Website: [email or web address]

## 2023-06-09 NOTE — Patient Outreach (Signed)
 Complex Care Management   Visit Note  06/09/2023  Name:  Sheila Reynolds MRN: [id number] DOB: 1945-06-06  Situation: Referral received for Complex Care Management related to Diabetes with Complications, Chronic Kidney Disease, and Glaucoma, Lymphedema, COPD.  I obtained verbal consent from Patient.  Visit completed with patient on the phone.  Background:   Past Medical History:  Diagnosis Date   Anemia    3 months ago anemic   Anxiety    on meds   Arthritis    "all over" (01/15/2017)   Asthma    Bronchitis with emphysema    Chest pain    Chronic bronchitis (HCC)    Coronary artery disease    a. 01/2017 she underwent orbital atherectomy/DES to the proxmal LAD and PTCA to ostial D2. 2D Echo 01/15/17 showed mild LVH, EF 60-65%, grade 1 DD.   Family history of anesthesia complication    daughter N/V   Fibromyalgia    GERD (gastroesophageal reflux disease)    on meds   Glaucoma    Heart murmur    History of hiatal hernia    Hx of echocardiogram    Echo (03/2013):  Tech limited; Mild focal basal septal hypertrophy, EF 60-65%, normal RVF   Hyperlipidemia    Hypertension    Nonsustained ventricular tachycardia (HCC)    OSA on CPAP    Premature atrial contractions    PVC (premature ventricular contraction)    a. Holter 12/16: NSR, occ PAC,PVCs   Sarcoidosis    Type II diabetes mellitus (HCC)     Assessment: Patient Reported Symptoms:  Cognitive Cognitive Status: Alert and oriented to person, place, and time Cognitive/Intellectual Conditions Management [RPT]: None reported or documented in medical history or problem list   Health Maintenance Behaviors: Annual physical exam, Healthy diet, Immunizations  Neurological Neurological Review of Symptoms: Other:, Numbness (change in memory) [place] Neurological Symptoms/Conditions [RPT]: Neuropathy Neurological Conditions:  (Neuropathy) Neurological Management Strategies: Routine screening Neurological Self-Management Outcome: 4  (good)  HEENT HEENT Symptoms Reported: Other: (difficulty chewing, discussed with Dr. Otho Blitz, dentist) HEENT Conditions: Vision problem(s), Ear problem(s) Vision Problems: glaucoma HEENT Management Strategies: Medication therapy, Routine screening HEENT Self-Management Outcome: 4 (good) Vision problem(s), Ear problem(s)  Cardiovascular Cardiovascular Symptoms Reported: Swelling in legs or feet Does patient have uncontrolled Hypertension?: No Cardiovascular Conditions: Coronary artery disease, Hypertension, High blood cholesterol, Heart failure (Aortic Atherosclerosis) Cardiovascular Management Strategies: Medication therapy, Routine screening Cardiovascular Self-Management Outcome: 4 (good)  Respiratory Respiratory Symptoms Reported: No symptoms reported Respiratory Conditions: COPD, Sleep disordered breathing (ILD; Pulmonary Fibrosis) Respiratory Self-Management Outcome: 4 (good) Respiratory Comment: OSA w/CPAP  Endocrine Patient reports the following symptoms related to hypoglycemia or hyperglycemia : No symptoms reported Is patient diabetic?: Yes Is patient checking blood sugars at home?: Yes Endocrine Conditions: Diabetes Endocrine Management Strategies: Medication therapy, Routine screening Endocrine Self-Management Outcome: 4 (good)  Gastrointestinal Gastrointestinal Symptoms Reported: Flatulence, Incontinence Additional Gastrointestinal Details: Educated patient regarding importance of fiber intake Gastrointestinal Self-Management Outcome: 3 (uncertain) Nutrition Risk Screen (CP): Difficulty chewing/swallowing  Genitourinary Genitourinary Symptoms Reported: Incontinence, Urgency Genitourinary Conditions: Incontinence, Chronic kidney disease Genitourinary Self-Management Outcome: 3 (uncertain) Genitourinary Comment: Encouraged patient to discuss her symptoms further with her PCP and to consider asking for a referral for urogynecology  Integumentary Integumentary Symptoms Reported:  No symptoms reported    Musculoskeletal Musculoskelatal Symptoms Reviewed: Muscle pain Musculoskeletal Conditions: Rheumatoid arthritis, Other Other Musculoskeletal Conditions: Lympedema to lower extremities Musculoskeletal Management Strategies: Routine screening, Medical device Musculoskeletal Self-Management Outcome: 4 (good) Musculoskeletal Comment: lympedema  pumps; compression stockings Falls in the past year?: Yes Number of falls in past year: 2 or more Was there an injury with Fall?: No Fall Risk Category Calculator: 2 Patient Fall Risk Level: Moderate Fall Risk Patient at Risk for Falls Due to: History of fall(s) Fall risk Follow up: Falls evaluation completed, Falls prevention discussed, Education provided  Psychosocial Psychosocial Symptoms Reported: No symptoms reported   Major Change/Loss/Stressor/Fears (CP): Death of a loved one Behaviors When Feeling Stressed/Fearful: mother in law passed Techniques to [place] with Loss/Stress/Change: Diversional activities Quality of Family Relationships: involved, helpful, supportive Do you feel physically threatened by others?: No      06/09/2023   10:48 AM  Depression screen PHQ 2/9  Decreased Interest 1  Down, Depressed, Hopeless 0  PHQ - 2 Score 1    There were no vitals filed for this visit.  Medications Reviewed Today     Reviewed by Kaylene Pascal, RN (Registered Nurse) on 06/09/23 at 1011  Med List Status: <None>   Medication Order Taking? Sig Documenting Provider Last Dose Status Informant  acetaminophen  (TYLENOL ) 500 MG tablet [id number] Yes Take 1,000 mg by mouth 2 (two) times daily as needed for moderate pain or headache. [provider] Taking Active Self  albuterol  (PROVENTIL ) (2.5 MG/3ML) 0.083% nebulizer solution [id number]  Take 3 mLs (2.5 mg total) by nebulization daily as needed for wheezing or shortness of breath. Susanna Epley, FNP  Consider Medication Status and Discontinue (Change in therapy) Self   albuterol  (VENTOLIN  HFA) 108 (90 Base) MCG/ACT inhaler [id number] Yes Inhale 2 puffs into the lungs every 6 (six) hours as needed for wheezing or shortness of breath. Rosa College D, MD Taking Active Self  ANORO ELLIPTA  62.5-25 MCG/ACT AEPB [id number] Yes INHALE 1 PUFF INTO THE LUNGS DAILY AT 6 AM [name], Praveen, MD Taking Active Self  aspirin  EC 81 MG tablet [id number]  Take 81 mg by mouth daily. Swallow whole. [provider]  Consider Medication Status and Discontinue (Discontinued by provider) Self           Med Note (MENDOZA MENDEZ, CARLOS A   Thu Apr 19, 2020 11:14 AM)    atorvastatin  (LIPITOR) 80 MG tablet [id number] Yes Take 1 tablet (80 mg total) by mouth daily. Clearnce Curia, NP Taking Active   Blood Glucose Monitoring Suppl (ACCU-CHEK AVIVA PLUS) w/Device KIT [id number]  Use to check blood sugars 3 times a day. Dx code e11.65 Cleave Curling, MD  Active Self  Blood Glucose Monitoring Suppl (ACCU-CHEK GUIDE) w/Device KIT [id number]  USE AS NEEDED TO CHECK BLOOD SUGARS. Cleave Curling, MD  Active   brimonidine  (ALPHAGAN  P) 0.1 % SOLN [id number] Yes Place 1 drop into both eyes in the morning, at noon, and at bedtime. [provider] Taking Active Self  brinzolamide  (AZOPT ) 1 % ophthalmic suspension [id number] Yes Place 1 drop into both eyes 3 (three) times daily. [provider] Taking Active Self  carvedilol  (COREG ) 12.5 MG tablet [id number] Yes Take 12.5 mg by mouth 2 (two) times daily with a meal. [provider] Taking Active   cetirizine  (ZYRTEC  ALLERGY) 10 MG tablet [id number] No Take 1 tablet (10 mg total) by mouth daily.  Patient not taking: Reported on 06/09/2023   Cleave Curling, MD Not Taking Active   EPINEPHrine  0.3 mg/0.3 mL IJ SOAJ injection [id number]  Inject 0.3 mg into the muscle as needed for anaphylaxis. Cleave Curling, MD  Active   ezetimibe  (ZETIA ) 10 MG tablet [id number] Yes Take 1 tablet (  10 mg total) by mouth daily. [name], Lela Purple, MD  Taking Active   furosemide  (LASIX ) 40 MG tablet [id number] Yes Take 1 tablet (40 mg total) by mouth daily as needed. [name], Lela Purple, MD Taking Active Self           Med Note (Genetta Fiero L   Tue Jun 09, 2023  9:49 AM) Cardiologist is aware and approved   ibuprofen  (ADVIL ) 600 MG tablet [id number] No Take 1 tablet (600 mg total) by mouth every 6 (six) hours as needed.  Patient not taking: Reported on 06/09/2023   Lesle Ras, MD Not Taking Consider Medication Status and Discontinue (Completed Course)   isosorbide  mononitrate (IMDUR ) 60 MG 24 hr tablet [id number] Yes TAKE 1 TABLET(60 MG) BY MOUTH DAILY [name], Lela Purple, MD Taking Active Self  JANUMET  50-500 MG tablet [id number] Yes TAKE 1 TABLET BY MOUTH TWICE DAILY WITH A MEAL Cleave Curling, MD Taking Active   ketorolac  (ACULAR ) 0.5 % ophthalmic solution [id number] Yes Place 1 drop into the right eye 2 (two) times daily. [provider] Taking Active Self  Lancets Emmett Harman DELICA PLUS [place]) MISC [id number]  CHECK BLOOD SUGAR BEFORE BREAKFAST AND Zachery Hermes, NP  Active Self  loperamide  (IMODIUM ) 2 MG capsule [id number]  Take 1 capsule (2 mg total) by mouth 4 (four) times daily as needed for diarrhea or loose stools.  Patient not taking: Reported on 05/06/2023   Edson Graces, MD  Consider Medication Status and Discontinue (Completed Course) Self  montelukast  (SINGULAIR ) 10 MG tablet [id number] Yes TAKE 1 TABLET BY MOUTH EVERY DAY Cleave Curling, MD Taking Active Self  Nebulizers (COMPRESSOR/NEBULIZER) MISC [id number]  Use as directed  Patient not taking: Reported on 05/06/2023   Faustina Hood, MD  Consider Medication Status and Discontinue (Change in therapy) Self  nitroGLYCERIN  (NITROSTAT ) 0.4 MG SL tablet [id number]  Place 1 tablet (0.4 mg total) under the tongue every 5 (five) minutes as needed for chest pain. Cleave Curling, MD  Active Self           Med Note Andee Kato, [facility]   Wed Dec 03, 2022 11:02 AM) Has on  hand  ondansetron  (ZOFRAN -ODT) 4 MG disintegrating tablet [id number]  Take 1 tablet (4 mg total) by mouth every 8 (eight) hours as needed for nausea or vomiting. Rai, Hurman Maiden, MD  Consider Medication Status and Discontinue (Completed Course)   pantoprazole  (PROTONIX ) 40 MG tablet [id number] Yes Take 1 tablet (40 mg total) by mouth daily. [name], Lela Purple, MD Taking Active Self  Polyvinyl Alcohol -Povidone PF (REFRESH) 1.4-0.6 % SOLN [id number] Yes Place 1-2 drops into both eyes 3 (three) times daily as needed (dry/irritated eyes.). [provider] Taking Active Self           Med Note (Amillion Macchia L   Tue Jun 09, 2023  9:35 AM)    potassium chloride  SA (KLOR-CON ) 20 MEQ tablet [id number] Yes Take 1 tablet (20 mEq total) by mouth every other day. [name], Lela Purple, MD Taking Active Self           Med Note Andee Kato, Lynford Sarin   Wed Dec 03, 2022 11:03 AM) Prn with furosemide   prednisoLONE acetate (PRED FORTE) 1 % ophthalmic suspension [id number] Yes Place 1 drop into both eyes in the morning, at noon, and at bedtime. Place 2 Drops in Left Eye, Place 3 Drops In Right Eye TID [provider] Taking Active Self  pregabalin  (LYRICA ) 75 MG capsule [id number] Yes TAKE 1  CAPSULE(75 MG) BY MOUTH TWICE DAILY Cleave Curling, MD Taking Active   RHOPRESSA  0.02 % SOLN [id number] Yes Place 1 drop into the left eye at bedtime. [provider] Taking Active Self  tiZANidine  (ZANAFLEX ) 4 MG tablet [id number]  Take 0.5 tablets (2 mg total) by mouth every 6 (six) hours as needed for muscle spasms.  Patient not taking: Reported on 05/06/2023   Lesle Ras, MD  Consider Medication Status and Discontinue (Patient Preference)             Recommendation:   PCP Follow-up Specialty provider follow-up [facility] for PFT on Monday, May 19 at 09:30 AM; Dr. Bertrum Brodie  on Friday, June 20, at 10:15 AM  Follow Up Plan:   Telephone follow up appointment date/time:  Friday, May 23 at 09:00  AM  Louanne Roussel RN BSN CCM Max  [facility], [facility] Health Nurse Care Coordinator  Direct Dial: [phone] Website: [email or web address]

## 2023-06-10 DIAGNOSIS — M0609 Rheumatoid arthritis without rheumatoid factor, multiple sites: Secondary | ICD-10-CM | POA: Diagnosis not present

## 2023-06-15 DIAGNOSIS — H401113 Primary open-angle glaucoma, right eye, severe stage: Secondary | ICD-10-CM | POA: Diagnosis not present

## 2023-06-15 DIAGNOSIS — E119 Type 2 diabetes mellitus without complications: Secondary | ICD-10-CM | POA: Diagnosis not present

## 2023-06-15 DIAGNOSIS — H2013 Chronic iridocyclitis, bilateral: Secondary | ICD-10-CM | POA: Diagnosis not present

## 2023-06-15 DIAGNOSIS — H401122 Primary open-angle glaucoma, left eye, moderate stage: Secondary | ICD-10-CM | POA: Diagnosis not present

## 2023-06-16 ENCOUNTER — Encounter (HOSPITAL_BASED_OUTPATIENT_CLINIC_OR_DEPARTMENT_OTHER)

## 2023-06-16 DIAGNOSIS — H2013 Chronic iridocyclitis, bilateral: Secondary | ICD-10-CM | POA: Diagnosis not present

## 2023-06-16 DIAGNOSIS — H15012 Anterior scleritis, left eye: Secondary | ICD-10-CM | POA: Diagnosis not present

## 2023-06-16 DIAGNOSIS — H3581 Retinal edema: Secondary | ICD-10-CM | POA: Diagnosis not present

## 2023-06-19 ENCOUNTER — Other Ambulatory Visit: Payer: Self-pay

## 2023-06-19 MED ORDER — PANTOPRAZOLE SODIUM 40 MG PO TBEC: 40.0000 mg | DELAYED_RELEASE_TABLET | Freq: Every day | ORAL | 3 refills | Status: DC

## 2023-06-19 MED ORDER — ISOSORBIDE MONONITRATE ER 60 MG PO TB24: ORAL_TABLET | ORAL | 3 refills | Status: AC

## 2023-06-19 MED ORDER — CARVEDILOL 12.5 MG PO TABS: 12.5000 mg | ORAL_TABLET | Freq: Two times a day (BID) | ORAL | 3 refills | Status: AC

## 2023-06-22 ENCOUNTER — Ambulatory Visit (HOSPITAL_BASED_OUTPATIENT_CLINIC_OR_DEPARTMENT_OTHER): Attending: Cardiovascular Disease | Admitting: Physical Therapy

## 2023-06-22 ENCOUNTER — Encounter (HOSPITAL_BASED_OUTPATIENT_CLINIC_OR_DEPARTMENT_OTHER): Payer: Self-pay | Admitting: Physical Therapy

## 2023-06-22 ENCOUNTER — Other Ambulatory Visit: Payer: Self-pay | Admitting: *Deleted

## 2023-06-22 DIAGNOSIS — R2681 Unsteadiness on feet: Secondary | ICD-10-CM | POA: Diagnosis not present

## 2023-06-22 DIAGNOSIS — R262 Difficulty in walking, not elsewhere classified: Secondary | ICD-10-CM | POA: Diagnosis not present

## 2023-06-22 DIAGNOSIS — R2689 Other abnormalities of gait and mobility: Secondary | ICD-10-CM | POA: Insufficient documentation

## 2023-06-22 DIAGNOSIS — M6281 Muscle weakness (generalized): Secondary | ICD-10-CM | POA: Insufficient documentation

## 2023-06-22 NOTE — Therapy (Signed)
 OUTPATIENT PHYSICAL THERAPY LOWER EXTREMITY EVALUATION   Patient Name: Sheila Reynolds MRN: [id number] DOB:1945-04-10, 78 y.o., female Today's Date: 06/22/2023  END OF SESSION:  PT End of Session - 06/22/23 1034     Visit Number 3    Number of Visits 18    Date for PT Re-Evaluation 08/30/23    Authorization Type MCR    PT Start Time 1030    PT Stop Time 1056    PT Time Calculation (min) 26 min    Activity Tolerance Patient tolerated treatment well    Behavior During Therapy WFL for tasks assessed/performed               Past Medical History:  Diagnosis Date   Anemia    3 months ago anemic   Anxiety    on meds   Arthritis    "all over" (01/15/2017)   Asthma    Bronchitis with emphysema    Chest pain    Chronic bronchitis (HCC)    Coronary artery disease    a. 01/2017 she underwent orbital atherectomy/DES to the proxmal LAD and PTCA to ostial D2. 2D Echo 01/15/17 showed mild LVH, EF 60-65%, grade 1 DD.   Family history of anesthesia complication    daughter N/V   Fibromyalgia    GERD (gastroesophageal reflux disease)    on meds   Glaucoma    Heart murmur    History of hiatal hernia    Hx of echocardiogram    Echo (03/2013):  Tech limited; Mild focal basal septal hypertrophy, EF 60-65%, normal RVF   Hyperlipidemia    Hypertension    Nonsustained ventricular tachycardia (HCC)    OSA on CPAP    Premature atrial contractions    PVC (premature ventricular contraction)    a. Holter 12/16: NSR, occ PAC,PVCs   Sarcoidosis    Type II diabetes mellitus (HCC)    Past Surgical History:  Procedure Laterality Date   BREAST SURGERY     CARDIAC CATHETERIZATION  05/04/2007   reveals overall normal left ventricular systolic function. Ejection fraction 65-70%   CATARACT EXTRACTION W/PHACO Right 07/20/2013   Procedure: CATARACT EXTRACTION PHACO AND INTRAOCULAR LENS PLACEMENT (IOC);  Surgeon: Ben Bracken, MD;  Location: [facility] OR;  Service: Ophthalmology;  Laterality: Right;    COLONOSCOPY W/ BIOPSIES AND POLYPECTOMY     COLONOSCOPY WITH PROPOFOL  N/A 09/07/2014   Procedure: COLONOSCOPY WITH PROPOFOL ;  Surgeon: Tami Falcon, MD;  Location: [facility];  Service: Endoscopy;  Laterality: N/A;   COLONOSCOPY WITH PROPOFOL  N/A 01/10/2020   Procedure: COLONOSCOPY WITH PROPOFOL ;  Surgeon: Tami Falcon, MD;  Location: [facility];  Service: Endoscopy;  Laterality: N/A;   CORONARY ANGIOPLASTY WITH STENT PLACEMENT  01/15/2017   CORONARY ATHERECTOMY N/A 01/15/2017   Procedure: CORONARY ATHERECTOMY;  Surgeon: Sammy Crisp, MD;  Location: [facility];  Service: Cardiovascular;  Laterality: N/A;   CORONARY BALLOON ANGIOPLASTY N/A 01/15/2017   Procedure: CORONARY BALLOON ANGIOPLASTY;  Surgeon: Sammy Crisp, MD;  Location: [facility];  Service: Cardiovascular;  Laterality: N/A;   CORONARY PRESSURE/FFR STUDY N/A 12/12/2016   Procedure: INTRAVASCULAR PRESSURE WIRE/FFR STUDY;  Surgeon: Sammy Crisp, MD;  Location: [facility];  Service: Cardiovascular;  Laterality: N/A;   CORONARY STENT INTERVENTION N/A 01/15/2017   Procedure: CORONARY STENT INTERVENTION;  Surgeon: Sammy Crisp, MD;  Location: [facility];  Service: Cardiovascular;  Laterality: N/A;   ESOPHAGOGASTRODUODENOSCOPY (EGD) WITH PROPOFOL  N/A 09/07/2014   Procedure: ESOPHAGOGASTRODUODENOSCOPY (EGD) WITH PROPOFOL ;  Surgeon: Tami Falcon, MD;  Location: [facility];  Service: Endoscopy;  Laterality: N/A;   EXTERNAL EAR SURGERY Bilateral 1970s   tumors removed   EYE SURGERY Left 2019   cataract extraction    EYE SURGERY Right 02/09/2018   eyelid surgery    MINI SHUNT INSERTION Right 07/20/2013   Procedure: INSERTION OF GLAUCOMA FILTRATION DEVICE RIGHT EYE;  Surgeon: Ben Bracken, MD;  Location: [facility] OR;  Service: Ophthalmology;  Laterality: Right;   MITOMYCIN  C APPLICATION Right 07/20/2013   Procedure: MITOMYCIN  C APPLICATION;  Surgeon: Ben Bracken, MD;  Location: [facility] OR;  Service:  Ophthalmology;  Laterality: Right;   MITOMYCIN  C APPLICATION Right 02/21/2015   Procedure: MITOMYCIN  C APPLICATION RIGHT EYE;  Surgeon: Ben Bracken, MD;  Location: [facility] OR;  Service: Ophthalmology;  Laterality: Right;   PLACEMENT OF BREAST IMPLANTS Bilateral 1992   "took all my breast tissue out; put implants in;fibrocystic breast disease "   POLYPECTOMY  01/10/2020   Procedure: POLYPECTOMY;  Surgeon: Tami Falcon, MD;  Location: [facility];  Service: Endoscopy;;   RIGHT/LEFT HEART CATH AND CORONARY ANGIOGRAPHY N/A 12/12/2016   Procedure: RIGHT/LEFT HEART CATH AND CORONARY ANGIOGRAPHY;  Surgeon: Sammy Crisp, MD;  Location: [facility];  Service: Cardiovascular;  Laterality: N/A;   TONSILLECTOMY     TOTAL ABDOMINAL HYSTERECTOMY  1982   TRABECULECTOMY Right 02/21/2015   Procedure: TRABECULECTOMY WITH [facility] ON THE RIGHT EYE;  Surgeon: Ben Bracken, MD;  Location: [facility] OR;  Service: Ophthalmology;  Laterality: Right;   Patient Active Problem List   Diagnosis Date Noted   Interstitial lung disease (HCC) 06/09/2023   Myalgia 04/28/2023   Near syncope 04/15/2023   Viral gastroenteritis 04/15/2023   Postconcussion syndrome 12/16/2022   Syncope 12/03/2022   Acute lower UTI 12/03/2022   Cavitating mass in right upper lung lobe 12/03/2022   Dyslipidemia 12/03/2022   Coronary artery disease 12/03/2022   Chronic obstructive pulmonary disease (COPD) (HCC) 12/03/2022   Cavitary pneumonia 12/03/2022   Atherosclerosis of aorta (HCC) 10/05/2022   Left leg pain 10/05/2022   Pain of toe of left foot 10/05/2022   Current moderate episode of major depressive disorder, unspecified whether recurrent (HCC) 09/22/2022   Abdominal hernia 05/15/2022   Candidal vulvovaginitis 05/15/2022   Fracture of foot 05/15/2022   Hearing loss 05/15/2022   Hypertensive heart disease without heart failure 05/15/2022   Menopausal syndrome 05/15/2022   Other long term (current) drug therapy 05/15/2022   Postherpetic  trigeminal neuralgia 05/15/2022   Swollen abdomen 05/15/2022   Type 2 diabetes mellitus with hyperglycemia (HCC) 05/15/2022   Urinary incontinence 05/15/2022   Partial symptomatic epilepsy with complex partial seizures, not intractable, without status epilepticus (HCC) 05/15/2022   Diabetic polyneuropathy associated with type 2 diabetes mellitus (HCC) 12/15/2021   Oxygen  dependent 12/15/2021   History of fall 12/15/2021   Chronic arthritis 12/15/2021   Dyslipidemia associated with type 2 diabetes mellitus (HCC) 11/05/2021   Generalized weakness 11/05/2021   Pneumonitis 11/05/2021   Postnasal drip 10/18/2021   Low hemoglobin 10/18/2021   Positive ANA (antinuclear antibody) 10/18/2021   BMI 29.0-29.9,adult 10/18/2021   IPF (idiopathic pulmonary fibrosis) (HCC) 08/20/2021   Urinary frequency 06/19/2021   Urinary tract infection without hematuria 06/19/2021   Imbalance 06/19/2021   Memory changes 06/19/2021   Drug therapy 06/19/2021   Back pain 05/24/2020   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 heart failure (HCC) 04/16/2020   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) (HCC) 04/13/2020  Chronic diastolic heart failure (HCC) 03/28/2020   Uncontrolled type 2 diabetes mellitus with hyperglycemia (HCC) 07/25/2019   Hiatal hernia 07/25/2019   Spontaneous ecchymoses 07/25/2019   Class 1 obesity due to excess calories with serious comorbidity and body mass index (BMI) of 30.0 to 30.9 in adult 07/25/2019   Peripheral vascular disease, unspecified (HCC) 03/23/2019   Uveitis 06/01/2017   Fibromyalgia 06/01/2017   DDD (degenerative disc disease), lumbar 06/01/2017   DDD (degenerative disc disease), thoracic 06/01/2017   History of hypercholesterolemia 06/01/2017   History of anxiety 06/01/2017   History of IBS 06/01/2017   Premature atrial contractions 03/30/2017   PVC's (premature ventricular contractions) 03/30/2017   Diabetes mellitus  without complication (HCC) 01/28/2017   Angina pectoris (HCC) 01/15/2017   CAD (coronary artery disease) 01/12/2017   Shortness of breath 12/12/2016   Nuclear sclerotic cataract of left eye 11/16/2015   Status post eye surgery 11/16/2015   Primary open angle glaucoma of left eye, moderate stage 10/31/2015   Chronic iritis of both eyes 08/22/2015   Cystoid macular edema of right eye 08/22/2015   Acute maxillary sinusitis 05/03/2014   Obstructive sleep apnea 11/04/2013   Multiple lung nodules 07/18/2012   Heart palpitations 03/11/2012   HTN (hypertension) 03/11/2012   Unspecified glaucoma 08/29/2011   Gastroesophageal reflux disease without esophagitis 07/13/2011   Sarcoidosis 02/08/2011   COPD with acute exacerbation (HCC) 02/05/2011   Hyperlipemia 09/24/2010   Atypical chest pain 09/24/2010   Leg edema 09/24/2010    PCP: Cleave Curling, MD   REFERRING PROVIDER:  Maudine Sos, MD       REFERRING DIAG:   R53.1 (ICD-10-CM) - Weakness  R26.89 (ICD-10-CM) - Balance problem    THERAPY DIAG:  Other abnormalities of gait and mobility  Unsteadiness on feet  Difficulty walking  Muscle weakness (generalized)  Rationale for Evaluation and Treatment: Rehabilitation  ONSET DATE: 2024  SUBJECTIVE:   SUBJECTIVE STATEMENT: goes by "Vee"  Pt reports no issues with last session Has been compliant with HEP. No pain and dizziness/lightheadedness since last time.   Eval:   Pt reports she was doing therapy about a year ago due to falls and balance issues. She has issues with bending down due to falling. Pt feels stiffness in the legs that limits ability to lift feet up. Pt reports history of blacking out and passing out. She has not had an episode of 6 months. Pt feels lightheaded and "something is wrong" before she passes out. She  does feel this consisently each of the 3x times she passes out. Has a history of 4 mini-strokes. Pt feels she looses her balance a couple times  day. She feels like she tips forward. Pt does have bilat hip and knee pain. Pt feels that back pain will cause her to chest pain, happens with long term standing. Pt does have neuropathy in both feet but is controlled. Pt does endorse catching her feet on the ground and difficulty with uneven surfaces. Pt denies lightheadedness or room spinning sensations.   PERTINENT HISTORY: Sarcoidosis, headaches, NSVT, HLD, leg edema, HTN, OSA, CAD, angina, DM2, PACs, PVCs, Fibromyalgia, PVD, chronic diastolic heart failure, recent diagnosis of pulmonary fibrosis  PAIN:  Are you having pain? Yes: NPRS scale: 4/10 Pain location: chest, back, bilat knees, and hips, and hands  Pain description: aching, sharp Aggravating factors: movement, standing, leaning fwd  Relieving factors: meds   PRECAUTIONS: Falls  RED FLAGS: None   WEIGHT BEARING RESTRICTIONS: No  FALLS:  Has patient  fallen in last 6 months? No, lots of near stumbles  LIVING ENVIRONMENT: Lives with: lives with their spouse Lives in: House/apartment Stairs: Yes  Has following equipment at home: [facility]  OCCUPATION: N/A  PLOF: Independent with basic ADLs and Independent with household mobility with device  PATIENT GOALS: improve her balance and walking, prevent falls; play her great grandson, be able to drive   OBJECTIVE:  Note: Objective measures were completed at Evaluation unless otherwise noted.  DIAGNOSTIC FINDINGS: N/A   BP seated R arm:  119/79  77 HR  FUNCTIONAL OUTCOME MEASURE:   Total ABC score: 500 / 1600 = 31.3 %    Berg Balance Scale = 28  Interpretation: This score is associated with almost 100% fall risk while the patient at the moment may be requiring assistance in performing certain activities of daily living such as walking.    LE  ROM:  LE WFL for tasks assessed; limited and stiff in bilateral hip and knee flexion and ext   LOWER EXTREMITY MMT:   MMT Right eval Left eval  Hip flexion 4/5 4/5  Hip  extension      Hip abduction 4/5 4/5  Hip adduction      Hip internal rotation      Hip external rotation      Knee flexion      Knee extension 4/5 4/5  Ankle dorsiflexion      Ankle plantarflexion      Ankle inversion      Ankle eversion       (Blank rows = not tested)     GAIT: shortened step length; shuffling gait, fwd flexed posture (pt reports leaving cane at home)     STS: Requires UE for stand and sit  Unable to perform 5XSTS   TREATMENT  5/12  Nustep warm up 5 min lvl 1  NBOS  airex EC 30s 4x NBOS with reach 3x5 in all directions for head hips Step taps 2x20 4"  4" step up 2x10    4/28 Nustep warm up 5 min lvl 1  STS 3x6 Transfer mechanics safety Sidestep at table 4x  Retro step 4x at table NBOS  airex EC 30s 4x Fwd step up on airex with focus on knee flexion and foot clearance 2x10 ea  HEP updates and modifications; posture effects on balance and COG over BOS   4/21  Exercises - Sit to Stand with Counter Support  - 1 x daily - 7 x weekly - 3 sets - 10 reps - Standing Heel Raises  - 1 x daily - 7 x weekly - 3 sets - 10 reps - Mini Squat with Counter Support  - 1 x daily - 7 x weekly - 3 sets - 10 reps       PATIENT EDUCATION:  Education details: diagnosis, prognosis, anatomy, exercise progression, DOMS expectations, muscle firing,  envelope of function, HEP, POC   Person educated: Patient Education method: Explanation, demonstration Education comprehension: verbalized understanding, returned demonstration   HOME EXERCISE PROGRAM:  Access Code: [email or web address] URL: [email or web address] Date: 06/01/2023 Prepared by: Silver Dross   ASSESSMENT:   CLINICAL IMPRESSION:  Pt session progressed with balance and reaching outside of BOS to simulate ADL. HEP updated today without complaints of pain. Pt's SL strength has improved with HEP and is able to continue with step up progression. Plan to continue with exercise as tolerated andbalance as  able. Pt would benefit from continued skilled therapy in order to reach goals and maximize  functional lumbopelvic strength and mobility for full return to safe community ambulation and falls prevention.       OBJECTIVE IMPAIRMENTS: Abnormal gait, cardiopulmonary status limiting activity, decreased activity tolerance, decreased balance, decreased endurance, decreased mobility, difficulty walking, decreased ROM, decreased strength, hypomobility, impaired flexibility, improper body mechanics, postural dysfunction, and pain.    ACTIVITY LIMITATIONS: carrying, lifting, bending, squatting, stairs, transfers, and locomotion level   PARTICIPATION LIMITATIONS: cleaning, laundry, and community activity   PERSONAL FACTORS: Age, Behavior pattern, Fitness, Time since onset of injury/illness/exacerbation, and 1-2 comorbidities:    are also affecting patient's functional outcome.    REHAB POTENTIAL: Fair     CLINICAL DECISION MAKING: Evolving/moderate complexity   EVALUATION COMPLEXITY: Moderate     GOALS:   SHORT TERM GOALS: Target date:  07/13/2023    Pt will become independent with HEP in order to demonstrate synthesis of PT education.   Baseline: Goal status: INITIAL     2.  Pt will report at least 2 pt reduction on NPRS scale for pain in order to demonstrate functional improvement with household activity, self care, and ADL.   Baseline:  Goal status: INITIAL       LONG TERM GOALS: Target date: POC Date   Pt  will become independent with final HEP in order to demonstrate synthesis of PT education.  Baseline:  Goal status: INITIAL   2.  Pt will be able to demonstrate/report ability to walk >10 mins without pain in order to demonstrate functional improvement and tolerance to exercise and community mobility.    Goal status: INITIAL   3. Pt will decrease 5XSTS by at least 3 seconds in order to demonstrate clinically significant improvement in LE strength   Baseline:  Goal status:  INITIAL   4.  Pt will have an at least 45/56 on Berg Balance scale in order to demonstrate improvement above cut off score for risk of falls.     Baseline:  Goal status: INITIAL  5. Pt will be able to demonstrate at least 67% confidence on ABC scale in order to demonstrate meaningful improvement in balance confidence above cut off score for older adults.  Baseline:  Goal status: INITIAL       PLAN:   PT FREQUENCY: 1-2x/week   PT DURATION: 12 wks (plan to DC in 8-10 with HEP)    PLANNED INTERVENTIONS: Therapeutic exercises, Therapeutic activity, Neuromuscular re-education, Balance training, Gait training, Patient/Family education, Self Care, Joint mobilization, Stair training, DME instructions, Cryotherapy, Moist heat, Taping, Manual therapy, and Re-evaluation   PLAN FOR NEXT SESSION: dynamic gait/balance, lumbar/hip strength and ROM     Silver Dross, PT 06/22/2023, 11:03 AM

## 2023-06-23 ENCOUNTER — Ambulatory Visit (HOSPITAL_BASED_OUTPATIENT_CLINIC_OR_DEPARTMENT_OTHER): Admitting: Physical Therapy

## 2023-06-23 DIAGNOSIS — I6782 Cerebral ischemia: Secondary | ICD-10-CM | POA: Diagnosis not present

## 2023-06-23 DIAGNOSIS — F4321 Adjustment disorder with depressed mood: Secondary | ICD-10-CM | POA: Diagnosis not present

## 2023-06-29 ENCOUNTER — Ambulatory Visit: Admitting: Internal Medicine

## 2023-06-29 ENCOUNTER — Ambulatory Visit (HOSPITAL_BASED_OUTPATIENT_CLINIC_OR_DEPARTMENT_OTHER): Admitting: Physical Therapy

## 2023-06-29 ENCOUNTER — Encounter (HOSPITAL_BASED_OUTPATIENT_CLINIC_OR_DEPARTMENT_OTHER): Payer: Self-pay | Admitting: Physical Therapy

## 2023-06-29 DIAGNOSIS — J849 Interstitial pulmonary disease, unspecified: Secondary | ICD-10-CM | POA: Diagnosis not present

## 2023-06-29 DIAGNOSIS — R2689 Other abnormalities of gait and mobility: Secondary | ICD-10-CM | POA: Diagnosis not present

## 2023-06-29 DIAGNOSIS — R262 Difficulty in walking, not elsewhere classified: Secondary | ICD-10-CM

## 2023-06-29 DIAGNOSIS — M6281 Muscle weakness (generalized): Secondary | ICD-10-CM

## 2023-06-29 DIAGNOSIS — Z8669 Personal history of other diseases of the nervous system and sense organs: Secondary | ICD-10-CM

## 2023-06-29 DIAGNOSIS — R2681 Unsteadiness on feet: Secondary | ICD-10-CM | POA: Diagnosis not present

## 2023-06-29 DIAGNOSIS — R5383 Other fatigue: Secondary | ICD-10-CM

## 2023-06-29 LAB — PULMONARY FUNCTION TEST
DL/VA % pred: 79 %
DL/VA: 3.3 ml/min/mmHg/L
DLCO unc % pred: 51 %
DLCO unc: 9.11 ml/min/mmHg
FEF 25-75 Pre: 0.8 L/s
FEF2575-%Pred-Pre: 51 %
FEV1-%Pred-Pre: 64 %
FEV1-Pre: 1.24 L
FEV1FVC-%Pred-Pre: 97 %
FEV6-%Pred-Pre: 69 %
FEV6-Pre: 1.7 L
FEV6FVC-%Pred-Pre: 105 %
FVC-%Pred-Pre: 65 %
FVC-Pre: 1.7 L
Pre FEV1/FVC ratio: 73 %
Pre FEV6/FVC Ratio: 100 %

## 2023-06-29 NOTE — Patient Instructions (Signed)
Spiro and DLCO performed today. 

## 2023-06-29 NOTE — Progress Notes (Signed)
Spiro and DLCO performed today. 

## 2023-06-29 NOTE — Therapy (Signed)
 OUTPATIENT PHYSICAL THERAPY LOWER EXTREMITY EVALUATION   Patient Name: Sheila Reynolds MRN: [id number] DOB:[birth year], 78 y.o., female Today's Date: 06/29/2023  END OF SESSION:  PT End of Session - 06/29/23 1410     Visit Number 4    Number of Visits 18    Date for PT Re-Evaluation 08/30/23    Authorization Type MCR    PT Start Time 1400    PT Stop Time 1441    PT Time Calculation (min) 41 min    Activity Tolerance Patient tolerated treatment well    Behavior During Therapy WFL for tasks assessed/performed               Past Medical History:  Diagnosis Date   Anemia    3 months ago anemic   Anxiety    on meds   Arthritis    "all over" (01/15/2017)   Asthma    Bronchitis with emphysema    Chest pain    Chronic bronchitis (HCC)    Coronary artery disease    a. 01/2017 she underwent orbital atherectomy/DES to the proxmal LAD and PTCA to ostial D2. 2D Echo 01/15/17 showed mild LVH, EF 60-65%, grade 1 DD.   Family history of anesthesia complication    daughter N/V   Fibromyalgia    GERD (gastroesophageal reflux disease)    on meds   Glaucoma    Heart murmur    History of hiatal hernia    Hx of echocardiogram    Echo (03/2013):  Tech limited; Mild focal basal septal hypertrophy, EF 60-65%, normal RVF   Hyperlipidemia    Hypertension    Nonsustained ventricular tachycardia (HCC)    OSA on CPAP    Premature atrial contractions    PVC (premature ventricular contraction)    a. Holter 12/16: NSR, occ PAC,PVCs   Sarcoidosis    Type II diabetes mellitus (HCC)    Past Surgical History:  Procedure Laterality Date   BREAST SURGERY     CARDIAC CATHETERIZATION  05/04/2007   reveals overall normal left ventricular systolic function. Ejection fraction 65-70%   CATARACT EXTRACTION W/PHACO Right 07/20/2013   Procedure: CATARACT EXTRACTION PHACO AND INTRAOCULAR LENS PLACEMENT (IOC);  Surgeon: Ben Bracken, MD;  Location: [facility] OR;  Service: Ophthalmology;  Laterality: Right;    COLONOSCOPY W/ BIOPSIES AND POLYPECTOMY     COLONOSCOPY WITH PROPOFOL  N/A 09/07/2014   Procedure: COLONOSCOPY WITH PROPOFOL ;  Surgeon: Tami Falcon, MD;  Location: [facility];  Service: Endoscopy;  Laterality: N/A;   COLONOSCOPY WITH PROPOFOL  N/A 01/10/2020   Procedure: COLONOSCOPY WITH PROPOFOL ;  Surgeon: Tami Falcon, MD;  Location: [facility];  Service: Endoscopy;  Laterality: N/A;   CORONARY ANGIOPLASTY WITH STENT PLACEMENT  01/15/2017   CORONARY ATHERECTOMY N/A 01/15/2017   Procedure: CORONARY ATHERECTOMY;  Surgeon: Sammy Crisp, MD;  Location: [facility];  Service: Cardiovascular;  Laterality: N/A;   CORONARY BALLOON ANGIOPLASTY N/A 01/15/2017   Procedure: CORONARY BALLOON ANGIOPLASTY;  Surgeon: Sammy Crisp, MD;  Location: [facility];  Service: Cardiovascular;  Laterality: N/A;   CORONARY PRESSURE/FFR STUDY N/A 12/12/2016   Procedure: INTRAVASCULAR PRESSURE WIRE/FFR STUDY;  Surgeon: Sammy Crisp, MD;  Location: [facility];  Service: Cardiovascular;  Laterality: N/A;   CORONARY STENT INTERVENTION N/A 01/15/2017   Procedure: CORONARY STENT INTERVENTION;  Surgeon: Sammy Crisp, MD;  Location: [facility];  Service: Cardiovascular;  Laterality: N/A;   ESOPHAGOGASTRODUODENOSCOPY (EGD) WITH PROPOFOL  N/A 09/07/2014   Procedure: ESOPHAGOGASTRODUODENOSCOPY (EGD) WITH PROPOFOL ;  Surgeon: Tami Falcon, MD;  Location: [facility];  Service: Endoscopy;  Laterality: N/A;   EXTERNAL EAR SURGERY Bilateral 1970s   tumors removed   EYE SURGERY Left 2019   cataract extraction    EYE SURGERY Right 02/09/2018   eyelid surgery    MINI SHUNT INSERTION Right 07/20/2013   Procedure: INSERTION OF GLAUCOMA FILTRATION DEVICE RIGHT EYE;  Surgeon: Ben Bracken, MD;  Location: [facility] OR;  Service: Ophthalmology;  Laterality: Right;   MITOMYCIN  C APPLICATION Right 07/20/2013   Procedure: MITOMYCIN  C APPLICATION;  Surgeon: Ben Bracken, MD;  Location: [facility] OR;  Service:  Ophthalmology;  Laterality: Right;   MITOMYCIN  C APPLICATION Right 02/21/2015   Procedure: MITOMYCIN  C APPLICATION RIGHT EYE;  Surgeon: Ben Bracken, MD;  Location: [facility] OR;  Service: Ophthalmology;  Laterality: Right;   PLACEMENT OF BREAST IMPLANTS Bilateral 1992   "took all my breast tissue out; put implants in;fibrocystic breast disease "   POLYPECTOMY  01/10/2020   Procedure: POLYPECTOMY;  Surgeon: Tami Falcon, MD;  Location: [facility];  Service: Endoscopy;;   RIGHT/LEFT HEART CATH AND CORONARY ANGIOGRAPHY N/A 12/12/2016   Procedure: RIGHT/LEFT HEART CATH AND CORONARY ANGIOGRAPHY;  Surgeon: Sammy Crisp, MD;  Location: [facility];  Service: Cardiovascular;  Laterality: N/A;   TONSILLECTOMY     TOTAL ABDOMINAL HYSTERECTOMY  1982   TRABECULECTOMY Right 02/21/2015   Procedure: TRABECULECTOMY WITH [facility] ON THE RIGHT EYE;  Surgeon: Ben Bracken, MD;  Location: [facility] OR;  Service: Ophthalmology;  Laterality: Right;   Patient Active Problem List   Diagnosis Date Noted   Interstitial lung disease (HCC) 06/09/2023   Myalgia 04/28/2023   Near syncope 04/15/2023   Viral gastroenteritis 04/15/2023   Postconcussion syndrome 12/16/2022   Syncope 12/03/2022   Acute lower UTI 12/03/2022   Cavitating mass in right upper lung lobe 12/03/2022   Dyslipidemia 12/03/2022   Coronary artery disease 12/03/2022   Chronic obstructive pulmonary disease (COPD) (HCC) 12/03/2022   Cavitary pneumonia 12/03/2022   Atherosclerosis of aorta (HCC) 10/05/2022   Left leg pain 10/05/2022   Pain of toe of left foot 10/05/2022   Current moderate episode of major depressive disorder, unspecified whether recurrent (HCC) 09/22/2022   Abdominal hernia 05/15/2022   Candidal vulvovaginitis 05/15/2022   Fracture of foot 05/15/2022   Hearing loss 05/15/2022   Hypertensive heart disease without heart failure 05/15/2022   Menopausal syndrome 05/15/2022   Other long term (current) drug therapy 05/15/2022   Postherpetic  trigeminal neuralgia 05/15/2022   Swollen abdomen 05/15/2022   Type 2 diabetes mellitus with hyperglycemia (HCC) 05/15/2022   Urinary incontinence 05/15/2022   Partial symptomatic epilepsy with complex partial seizures, not intractable, without status epilepticus (HCC) 05/15/2022   Diabetic polyneuropathy associated with type 2 diabetes mellitus (HCC) 12/15/2021   Oxygen  dependent 12/15/2021   History of fall 12/15/2021   Chronic arthritis 12/15/2021   Dyslipidemia associated with type 2 diabetes mellitus (HCC) 11/05/2021   Generalized weakness 11/05/2021   Pneumonitis 11/05/2021   Postnasal drip 10/18/2021   Low hemoglobin 10/18/2021   Positive ANA (antinuclear antibody) 10/18/2021   BMI 29.0-29.9,adult 10/18/2021   IPF (idiopathic pulmonary fibrosis) (HCC) 08/20/2021   Urinary frequency 06/19/2021   Urinary tract infection without hematuria 06/19/2021   Imbalance 06/19/2021   Memory changes 06/19/2021   Drug therapy 06/19/2021   Back pain 05/24/2020   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 heart failure (HCC) 04/16/2020   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) (HCC) 04/13/2020  Chronic diastolic heart failure (HCC) 03/28/2020   Uncontrolled type 2 diabetes mellitus with hyperglycemia (HCC) 07/25/2019   Hiatal hernia 07/25/2019   Spontaneous ecchymoses 07/25/2019   Class 1 obesity due to excess calories with serious comorbidity and body mass index (BMI) of 30.0 to 30.9 in adult 07/25/2019   Peripheral vascular disease, unspecified (HCC) 03/23/2019   Uveitis 06/01/2017   Fibromyalgia 06/01/2017   DDD (degenerative disc disease), lumbar 06/01/2017   DDD (degenerative disc disease), thoracic 06/01/2017   History of hypercholesterolemia 06/01/2017   History of anxiety 06/01/2017   History of IBS 06/01/2017   Premature atrial contractions 03/30/2017   PVC's (premature ventricular contractions) 03/30/2017   Diabetes mellitus  without complication (HCC) 01/28/2017   Angina pectoris (HCC) 01/15/2017   CAD (coronary artery disease) 01/12/2017   Shortness of breath 12/12/2016   Nuclear sclerotic cataract of left eye 11/16/2015   Status post eye surgery 11/16/2015   Primary open angle glaucoma of left eye, moderate stage 10/31/2015   Chronic iritis of both eyes 08/22/2015   Cystoid macular edema of right eye 08/22/2015   Acute maxillary sinusitis 05/03/2014   Obstructive sleep apnea 11/04/2013   Multiple lung nodules 07/18/2012   Heart palpitations 03/11/2012   HTN (hypertension) 03/11/2012   Unspecified glaucoma 08/29/2011   Gastroesophageal reflux disease without esophagitis 07/13/2011   Sarcoidosis 02/08/2011   COPD with acute exacerbation (HCC) 02/05/2011   Hyperlipemia 09/24/2010   Atypical chest pain 09/24/2010   Leg edema 09/24/2010    PCP: Cleave Curling, MD   REFERRING PROVIDER:  Maudine Sos, MD       REFERRING DIAG:   R53.1 (ICD-10-CM) - Weakness  R26.89 (ICD-10-CM) - Balance problem    THERAPY DIAG:  Other abnormalities of gait and mobility  Unsteadiness on feet  Difficulty walking  Muscle weakness (generalized)  Rationale for Evaluation and Treatment: Rehabilitation  ONSET DATE: 2024  SUBJECTIVE:   SUBJECTIVE STATEMENT: goes by "Vee"  Pt states she was very sore and fatigued after last session. Pt did have a stress test this morning that she is very fatigued from.  Eval:   Pt reports she was doing therapy about a year ago due to falls and balance issues. She has issues with bending down due to falling. Pt feels stiffness in the legs that limits ability to lift feet up. Pt reports history of blacking out and passing out. She has not had an episode of 6 months. Pt feels lightheaded and "something is wrong" before she passes out. She  does feel this consisently each of the 3x times she passes out. Has a history of 4 mini-strokes. Pt feels she looses her balance a couple  times day. She feels like she tips forward. Pt does have bilat hip and knee pain. Pt feels that back pain will cause her to chest pain, happens with long term standing. Pt does have neuropathy in both feet but is controlled. Pt does endorse catching her feet on the ground and difficulty with uneven surfaces. Pt denies lightheadedness or room spinning sensations.   PERTINENT HISTORY: Sarcoidosis, headaches, NSVT, HLD, leg edema, HTN, OSA, CAD, angina, DM2, PACs, PVCs, Fibromyalgia, PVD, chronic diastolic heart failure, recent diagnosis of pulmonary fibrosis  PAIN:  Are you having pain? Yes: NPRS scale: 4/10 Pain location: chest, back, bilat knees, and hips, and hands  Pain description: aching, sharp Aggravating factors: movement, standing, leaning fwd  Relieving factors: meds   PRECAUTIONS: Falls  RED FLAGS: None   WEIGHT BEARING RESTRICTIONS: No  FALLS:  Has patient fallen in last 6 months? No, lots of near stumbles  LIVING ENVIRONMENT: Lives with: lives with their spouse Lives in: House/apartment Stairs: Yes  Has following equipment at home: [facility]  OCCUPATION: N/A  PLOF: Independent with basic ADLs and Independent with household mobility with device  PATIENT GOALS: improve her balance and walking, prevent falls; play her great grandson, be able to drive   OBJECTIVE:  Note: Objective measures were completed at Evaluation unless otherwise noted.  DIAGNOSTIC FINDINGS: N/A   BP seated R arm:  119/79  77 HR  FUNCTIONAL OUTCOME MEASURE:   Total ABC score: 500 / 1600 = 31.3 %    Berg Balance Scale = 28  Interpretation: This score is associated with almost 100% fall risk while the patient at the moment may be requiring assistance in performing certain activities of daily living such as walking.    LE  ROM:  LE WFL for tasks assessed; limited and stiff in bilateral hip and knee flexion and ext   LOWER EXTREMITY MMT:   MMT Right eval Left eval  Hip flexion 4/5 4/5   Hip extension      Hip abduction 4/5 4/5  Hip adduction      Hip internal rotation      Hip external rotation      Knee flexion      Knee extension 4/5 4/5  Ankle dorsiflexion      Ankle plantarflexion      Ankle inversion      Ankle eversion       (Blank rows = not tested)     GAIT: shortened step length; shuffling gait, fwd flexed posture (pt reports leaving cane at home)     STS: Requires UE for stand and sit  Unable to perform 5XSTS   TREATMENT  5/19  Nustep warm up 6 min lvl 3 Standing HR   Airex SLS with toe touch 30s each Ariex march 2x20 Reactive stepping with gait belt perturbations static and walking (all directions)  3x rounds; 2x laps   Edu of balance mechanics and reactive stepping mechanics  5/12  Nustep warm up 5 min lvl 1  NBOS  airex EC 30s 4x NBOS with reach 3x5 in all directions for head hips Step taps 2x20 4"  4" step up 2x10    4/28 Nustep warm up 5 min lvl 1  STS 3x6 Transfer mechanics safety Sidestep at table 4x  Retro step 4x at table NBOS  airex EC 30s 4x Fwd step up on airex with focus on knee flexion and foot clearance 2x10 ea  HEP updates and modifications; posture effects on balance and COG over BOS   4/21  Exercises - Sit to Stand with Counter Support  - 1 x daily - 7 x weekly - 3 sets - 10 reps - Standing Heel Raises  - 1 x daily - 7 x weekly - 3 sets - 10 reps - Mini Squat with Counter Support  - 1 x daily - 7 x weekly - 3 sets - 10 reps       PATIENT EDUCATION:  Education details: diagnosis, prognosis, anatomy, exercise progression, DOMS expectations, muscle firing,  envelope of function, HEP, POC   Person educated: Patient Education method: Explanation, demonstration Education comprehension: verbalized understanding, returned demonstration   HOME EXERCISE PROGRAM:  Access Code: [email or web address] URL: [email or web address] Date: 06/01/2023 Prepared by: Silver Dross   ASSESSMENT:   CLINICAL  IMPRESSION:  Pt session focused on balance exercise  as pt reports significant soreness from previous session that continues to linger. Pt was able to demo good reactive stepping reaction but does require verbal cuing for dropping COG as well as widening of BOS when reacting. Pt does tend to have high COG and continuous small shuffling steps without cuing. Plan to continue with progressive balance as tolerated with more dynamic and reactive challenges. Pt would benefit from continued skilled therapy in order to reach goals and maximize functional lumbopelvic strength and mobility for full return to safe community ambulation and falls prevention.       OBJECTIVE IMPAIRMENTS: Abnormal gait, cardiopulmonary status limiting activity, decreased activity tolerance, decreased balance, decreased endurance, decreased mobility, difficulty walking, decreased ROM, decreased strength, hypomobility, impaired flexibility, improper body mechanics, postural dysfunction, and pain.    ACTIVITY LIMITATIONS: carrying, lifting, bending, squatting, stairs, transfers, and locomotion level   PARTICIPATION LIMITATIONS: cleaning, laundry, and community activity   PERSONAL FACTORS: Age, Behavior pattern, Fitness, Time since onset of injury/illness/exacerbation, and 1-2 comorbidities:    are also affecting patient's functional outcome.    REHAB POTENTIAL: Fair     CLINICAL DECISION MAKING: Evolving/moderate complexity   EVALUATION COMPLEXITY: Moderate     GOALS:   SHORT TERM GOALS: Target date:  07/13/2023    Pt will become independent with HEP in order to demonstrate synthesis of PT education.   Baseline: Goal status: INITIAL     2.  Pt will report at least 2 pt reduction on NPRS scale for pain in order to demonstrate functional improvement with household activity, self care, and ADL.   Baseline:  Goal status: INITIAL       LONG TERM GOALS: Target date: POC Date   Pt  will become independent with final HEP  in order to demonstrate synthesis of PT education.  Baseline:  Goal status: INITIAL   2.  Pt will be able to demonstrate/report ability to walk >10 mins without pain in order to demonstrate functional improvement and tolerance to exercise and community mobility.    Goal status: INITIAL   3. Pt will decrease 5XSTS by at least 3 seconds in order to demonstrate clinically significant improvement in LE strength   Baseline:  Goal status: INITIAL   4.  Pt will have an at least 45/56 on Berg Balance scale in order to demonstrate improvement above cut off score for risk of falls.     Baseline:  Goal status: INITIAL  5. Pt will be able to demonstrate at least 67% confidence on ABC scale in order to demonstrate meaningful improvement in balance confidence above cut off score for older adults.  Baseline:  Goal status: INITIAL       PLAN:   PT FREQUENCY: 1-2x/week   PT DURATION: 12 wks (plan to DC in 8-10 with HEP)    PLANNED INTERVENTIONS: Therapeutic exercises, Therapeutic activity, Neuromuscular re-education, Balance training, Gait training, Patient/Family education, Self Care, Joint mobilization, Stair training, DME instructions, Cryotherapy, Moist heat, Taping, Manual therapy, and Re-evaluation   PLAN FOR NEXT SESSION: dynamic gait/balance, lumbar/hip strength and ROM     Silver Dross, PT 06/29/2023, 2:46 PM

## 2023-06-30 ENCOUNTER — Encounter: Payer: Self-pay | Admitting: Dietician

## 2023-06-30 ENCOUNTER — Encounter: Attending: Internal Medicine | Admitting: Dietician

## 2023-06-30 ENCOUNTER — Ambulatory Visit (HOSPITAL_BASED_OUTPATIENT_CLINIC_OR_DEPARTMENT_OTHER): Admitting: Physical Therapy

## 2023-06-30 VITALS — Wt 170.0 lb

## 2023-06-30 DIAGNOSIS — E119 Type 2 diabetes mellitus without complications: Secondary | ICD-10-CM | POA: Diagnosis not present

## 2023-06-30 NOTE — Patient Instructions (Addendum)
 Goals Established by Patient:  Goal 1: Drink four 8 oz bottles of water every day. (Or 2 16 oz bottles).  Goal 2: walk the driveway or to the mail box 5 times per week.   Goal 3: limit fried food to 2-3 times per week.   Goal 4: Follow the "Plate Method" at least once a day. (1/2 plate nonstarchy vegetables, 1/4 plate complex carbs, 1/4 plate lean protein)  Goal 5: eat dinner at 7pm.

## 2023-06-30 NOTE — Progress Notes (Signed)
 Diabetes Self-Management Education  Visit Type: First/Initial  Appt. Start Time: 1408 Appt. End Time: 1508  06/30/2023  Sheila Reynolds, identified by name and date of birth, is a 78 y.o. female with a diagnosis of Diabetes: Type 2.   ASSESSMENT  History includes: anemia, anxiety, arthritis, asthma, COPD, type 2 diabetes, GERD, glaucoma, heart murmur, HLD, HTN, nerve disease Labs noted: 04/15/23: A1c 6.2% Medications include: janumet  Supplements: none reported  Pt present with husband Sheila Reynolds.  Pt states she thinks her eating habits are terrible. Pt reports they eat a lot of fried foods, and states she feels she doesn't eat consistently and snacks a lot. Pt reports they eat out for dinner every other night ([address] [place], [employer], [place]).  Pt husband Sheila Reynolds states that they often fluctuate when they eat dinner, sometimes 7pm or 10pm and wants to eat more consistently. Pt reports they go to sleep at 12/1am.   Pt and husband share the cooking.   Pt reports she is in physical therapy due to balance issues. Pt reports she tries to walk to her mailbox or the driveway a few times a week but not consistently.   Weight 170 lb (77.1 kg). Body mass index is 31.09 kg/m.   Diabetes Self-Management Education - 06/30/23 1407       Visit Information   Visit Type First/Initial      Initial Visit   Diabetes Type Type 2    Date Diagnosed unsure    Are you currently following a meal plan? No    Are you taking your medications as prescribed? Yes      Health Coping   How would you rate your overall health? Good      Psychosocial Assessment   Patient Belief/Attitude about Diabetes Motivated to manage diabetes    What is the hardest part about your diabetes right now, causing you the most concern, or is the most worrisome to you about your diabetes?   Making healty food and beverage choices    Self-care barriers None    Self-management support Doctor's office    Other persons present  Patient;Spouse/SO    Patient Concerns Nutrition/Meal planning    Special Needs None    Preferred Learning Style No preference indicated    Learning Readiness Ready    How often do you need to have someone help you when you read instructions, pamphlets, or other written materials from your doctor or pharmacy? 1 - Never    What is the last grade level you completed in school? some college      Pre-Education Assessment   Patient understands the diabetes disease and treatment process. Needs Instruction    Patient understands incorporating nutritional management into lifestyle. Needs Instruction    Patient undertands incorporating physical activity into lifestyle. Needs Instruction    Patient understands using medications safely. Needs Instruction    Patient understands monitoring blood glucose, interpreting and using results Needs Instruction    Patient understands prevention, detection, and treatment of acute complications. Needs Instruction    Patient understands prevention, detection, and treatment of chronic complications. Needs Instruction    Patient understands how to develop strategies to address psychosocial issues. Needs Instruction    Patient understands how to develop strategies to promote health/change behavior. Needs Instruction      Complications   Last HgB A1C per patient/outside source 6.2 %    How often do you check your blood sugar? 3-4 times / week    Fasting Blood glucose range (mg/dL)  70-129    Have you had a dilated eye exam in the past 12 months? Yes    Have you had a dental exam in the past 12 months? Yes    Are you checking your feet? Yes      Dietary Intake   Breakfast 9am: pancakes, eggs, bacon OR applesauce and croissant OR bojangles    Snack (morning) none    Lunch 2pm: leftovers    Snack (afternoon) chips OR cheese itz OR popcorn    Dinner 7pm: fried meat and mac and cheese and broccoli OR pizza    Snack (evening) chips OR dessert    Beverage(s) coffee with  splenda, 16 oz water, 2 cans soda,      Activity / Exercise   Activity / Exercise Type ADL's      Patient Education   Previous Diabetes Education No    Disease Pathophysiology Definition of diabetes, type 1 and 2, and the diagnosis of diabetes;Explored patient's options for treatment of their diabetes    Healthy Eating Role of diet in the treatment of diabetes and the relationship between the three main macronutrients and blood glucose level;Plate Method;Reviewed blood glucose goals for pre and post meals and how to evaluate the patients' food intake on their blood glucose level.;Meal options for control of blood glucose level and chronic complications.;Meal timing in regards to the patients' current diabetes medication.    Being Active Role of exercise on diabetes management, blood pressure control and cardiac health.;Helped patient identify appropriate exercises in relation to his/her diabetes, diabetes complications and other health issue.    Medications Reviewed patients medication for diabetes, action, purpose, timing of dose and side effects.    Monitoring Identified appropriate SMBG and/or A1C goals.    Chronic complications Relationship between chronic complications and blood glucose control;Identified and discussed with patient  current chronic complications    Diabetes Stress and Support Identified and addressed patients feelings and concerns about diabetes;Worked with patient to identify barriers to care and solutions    Lifestyle and Health Coping Lifestyle issues that need to be addressed for better diabetes care      Individualized Goals (developed by patient)   Nutrition General guidelines for healthy choices and portions discussed    Physical Activity Exercise 5-7 days per week;15 minutes per day    Medications take my medication as prescribed    Monitoring  Test my blood glucose as discussed    Problem Solving Eating Pattern    Reducing Risk examine blood glucose patterns;do  foot checks daily;treat hypoglycemia with 15 grams of carbs if blood glucose less than 70mg /dL    Health Coping Ask for help with psychological, social, or emotional issues      Post-Education Assessment   Patient understands the diabetes disease and treatment process. Comprehends key points    Patient understands incorporating nutritional management into lifestyle. Comprehends key points    Patient undertands incorporating physical activity into lifestyle. Comprehends key points    Patient understands using medications safely. Comphrehends key points    Patient understands monitoring blood glucose, interpreting and using results Comprehends key points    Patient understands prevention, detection, and treatment of acute complications. Needs Review    Patient understands prevention, detection, and treatment of chronic complications. Comprehends key points    Patient understands how to develop strategies to address psychosocial issues. Comprehends key points    Patient understands how to develop strategies to promote health/change behavior. Comprehends key points  Outcomes   Expected Outcomes Demonstrated interest in learning. Expect positive outcomes    Future DMSE 2 months    Program Status Not Completed             Individualized Plan for Diabetes Self-Management Training:   Learning Objective:  Patient will have a greater understanding of diabetes self-management. Patient education plan is to attend individual and/or group sessions per assessed needs and concerns.   Plan:   Patient Instructions  Goals Established by Patient:  Goal 1: Drink four 8 oz bottles of water every day. (Or 2 16 oz bottles).  Goal 2: walk the driveway or to the mail box 5 times per week.   Goal 3: limit fried food to 2-3 times per week.   Goal 4: Follow the "Plate Method" at least once a day. (1/2 plate nonstarchy vegetables, 1/4 plate complex carbs, 1/4 plate lean protein)  Goal 5: eat dinner  at 7pm.  Expected Outcomes:  Demonstrated interest in learning. Expect positive outcomes  Education material provided: ADA - How to Thrive: A Guide for Your Journey with Diabetes and My Plate, Nonstarchy vegetable list  If problems or questions, patient to contact team via:  Phone  Future DSME appointment: 2 months

## 2023-07-02 ENCOUNTER — Ambulatory Visit: Payer: Self-pay

## 2023-07-02 NOTE — Telephone Encounter (Signed)
 Chief Complaint: cough, pain Symptoms: cough, runny nose, pain Frequency: intermittent  Pertinent Negatives: Patient denies NA Disposition: [x] ED /[] Urgent Care (no appt availability in office) / [] Appointment(In office/virtual)/ []  [facility]/ [] Home Care/ [] Refused Recommended Disposition /[] [facility]/ []  Follow-up with PCP Additional Notes: Symptoms started on Monday, started as sore throat. Cough not productive. Runny nose. Fever yesterday has resolved. She is having cramping in her chest and side. Constant chest pain, worse with movement. Denies shortness of breath, conversational dyspnea noted. Emergency room evaluation advised, she does have a ride she will call for, discussed calling for ambulance if ride not immediately available, she states she has an emergency alert button she will push if needed.    Copied from CRM [id number]. Topic: Clinical - Red Word Triage >> Jul 02, 2023  4:33 PM Kevelyn M wrote: Red Word that prompted transfer to Nurse Triage: experiencing cramps in lungs, hands, and wrist. Had a fever, cough, running nose, sneezing, and achy. Reason for Disposition  Chest pain  (Exception: MILD central chest pain, present only when coughing.)  Protocols used: Cough - Acute Non-Productive-A-AH

## 2023-07-03 ENCOUNTER — Telehealth: Payer: Self-pay

## 2023-07-06 ENCOUNTER — Ambulatory Visit: Payer: Self-pay

## 2023-07-06 ENCOUNTER — Ambulatory Visit
Admission: EM | Admit: 2023-07-06 | Discharge: 2023-07-06 | Disposition: A | Discharge status: Home / Self Care | Attending: Physician Assistant | Admitting: Physician Assistant

## 2023-07-06 ENCOUNTER — Other Ambulatory Visit: Payer: Self-pay

## 2023-07-06 DIAGNOSIS — M436 Torticollis: Secondary | ICD-10-CM

## 2023-07-06 DIAGNOSIS — J0140 Acute pansinusitis, unspecified: Secondary | ICD-10-CM | POA: Diagnosis not present

## 2023-07-06 MED ORDER — TRIAMCINOLONE ACETONIDE 40 MG/ML IJ SUSP
40.0000 mg | Freq: Once | INTRAMUSCULAR | Status: AC
Start: 2023-07-06 — End: 2023-07-06
  Administered 2023-07-06: 40 mg via INTRAMUSCULAR

## 2023-07-06 MED ORDER — AMOXICILLIN-POT CLAVULANATE 875-125 MG PO TABS: 1.0000 | ORAL_TABLET | Freq: Two times a day (BID) | ORAL | 0 refills | Status: AC

## 2023-07-06 MED ORDER — AMOXICILLIN-POT CLAVULANATE 875-125 MG PO TABS: 1.0000 | ORAL_TABLET | Freq: Two times a day (BID) | ORAL | 0 refills | Status: DC

## 2023-07-06 NOTE — ED Triage Notes (Signed)
 Pt presents with complaints of cough and nasal congestion x 6 days. States she had a fever about two days ago. Unsure of her temperature, states she felt warm. OTC cough syrup + sinus medication taken.   Pt is also reporting left-sided neck pain x 2 days. Rates her pain a 7/10 pain, pain increases with movement of the neck. OTC Tylenol  taken with no relief.

## 2023-07-06 NOTE — ED Provider Notes (Addendum)
 [facility]    CSN: [id number] Arrival date & time: 07/06/23  1503      History   Chief Complaint Chief Complaint  Patient presents with   Nasal Congestion   Neck Pain    HPI Sheila [facility] is a 78 y.o. female.   HPI  She reports concerns for upper respiratory symptoms - coughing, nasal congestion runny nose, fever, myalgias She states this has been ongoing since Tuesday last week She reports a few days ago she started to have neck pain on the left side  She reports coughing is productive  She denies recent sick contacts Interventions: sinus medication- Singulair  and Zyrtec , Tylenol     Past Medical History:  Diagnosis Date   Anemia    3 months ago anemic   Anxiety    on meds   Arthritis    "all over" (01/15/2017)   Asthma    Bronchitis with emphysema    Chest pain    Chronic bronchitis (HCC)    Coronary artery disease    a. 01/2017 she underwent orbital atherectomy/DES to the proxmal LAD and PTCA to ostial D2. 2D Echo 01/15/17 showed mild LVH, EF 60-65%, grade 1 DD.   Family history of anesthesia complication    daughter N/V   Fibromyalgia    GERD (gastroesophageal reflux disease)    on meds   Glaucoma    Heart murmur    History of hiatal hernia    Hx of echocardiogram    Echo (03/2013):  Tech limited; Mild focal basal septal hypertrophy, EF 60-65%, normal RVF   Hyperlipidemia    Hypertension    Nonsustained ventricular tachycardia (HCC)    OSA on CPAP    Premature atrial contractions    PVC (premature ventricular contraction)    a. Holter 12/16: NSR, occ PAC,PVCs   Sarcoidosis    Type II diabetes mellitus (HCC)     Patient Active Problem List   Diagnosis Date Noted   Interstitial lung disease (HCC) 06/09/2023   Myalgia 04/28/2023   Near syncope 04/15/2023   Viral gastroenteritis 04/15/2023   Postconcussion syndrome 12/16/2022   Syncope 12/03/2022   Acute lower UTI 12/03/2022   Cavitating mass in right upper lung lobe 12/03/2022    Dyslipidemia 12/03/2022   Coronary artery disease 12/03/2022   Chronic obstructive pulmonary disease (COPD) (HCC) 12/03/2022   Cavitary pneumonia 12/03/2022   Atherosclerosis of aorta (HCC) 10/05/2022   Left leg pain 10/05/2022   Pain of toe of left foot 10/05/2022   Current moderate episode of major depressive disorder, unspecified whether recurrent (HCC) 09/22/2022   Abdominal hernia 05/15/2022   Candidal vulvovaginitis 05/15/2022   Fracture of foot 05/15/2022   Hearing loss 05/15/2022   Hypertensive heart disease without heart failure 05/15/2022   Menopausal syndrome 05/15/2022   Other long term (current) drug therapy 05/15/2022   Postherpetic trigeminal neuralgia 05/15/2022   Swollen abdomen 05/15/2022   Type 2 diabetes mellitus with hyperglycemia (HCC) 05/15/2022   Urinary incontinence 05/15/2022   Partial symptomatic epilepsy with complex partial seizures, not intractable, without status epilepticus (HCC) 05/15/2022   Diabetic polyneuropathy associated with type 2 diabetes mellitus (HCC) 12/15/2021   Oxygen  dependent 12/15/2021   History of fall 12/15/2021   Chronic arthritis 12/15/2021   Dyslipidemia associated with type 2 diabetes mellitus (HCC) 11/05/2021   Generalized weakness 11/05/2021   Pneumonitis 11/05/2021   Postnasal drip 10/18/2021   Low hemoglobin 10/18/2021   Positive ANA (antinuclear antibody) 10/18/2021   BMI 29.0-29.9,adult 10/18/2021  IPF (idiopathic pulmonary fibrosis) (HCC) 08/20/2021   Urinary frequency 06/19/2021   Urinary tract infection without hematuria 06/19/2021   Imbalance 06/19/2021   Memory changes 06/19/2021   Drug therapy 06/19/2021   Back pain 05/24/2020   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 heart failure (HCC) 04/16/2020   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) (HCC) 04/13/2020   Chronic diastolic heart failure (HCC) 03/28/2020   Uncontrolled type 2 diabetes mellitus with  hyperglycemia (HCC) 07/25/2019   Hiatal hernia 07/25/2019   Spontaneous ecchymoses 07/25/2019   Class 1 obesity due to excess calories with serious comorbidity and body mass index (BMI) of 30.0 to 30.9 in adult 07/25/2019   Peripheral vascular disease, unspecified (HCC) 03/23/2019   Uveitis 06/01/2017   Fibromyalgia 06/01/2017   DDD (degenerative disc disease), lumbar 06/01/2017   DDD (degenerative disc disease), thoracic 06/01/2017   History of hypercholesterolemia 06/01/2017   History of anxiety 06/01/2017   History of IBS 06/01/2017   Premature atrial contractions 03/30/2017   PVC's (premature ventricular contractions) 03/30/2017   Diabetes mellitus without complication (HCC) 01/28/2017   Angina pectoris (HCC) 01/15/2017   CAD (coronary artery disease) 01/12/2017   Shortness of breath 12/12/2016   Nuclear sclerotic cataract of left eye 11/16/2015   Status post eye surgery 11/16/2015   Primary open angle glaucoma of left eye, moderate stage 10/31/2015   Chronic iritis of both eyes 08/22/2015   Cystoid macular edema of right eye 08/22/2015   Acute maxillary sinusitis 05/03/2014   Obstructive sleep apnea 11/04/2013   Multiple lung nodules 07/18/2012   Heart palpitations 03/11/2012   HTN (hypertension) 03/11/2012   Unspecified glaucoma 08/29/2011   Gastroesophageal reflux disease without esophagitis 07/13/2011   Sarcoidosis 02/08/2011   COPD with acute exacerbation (HCC) 02/05/2011   Hyperlipemia 09/24/2010   Atypical chest pain 09/24/2010   Leg edema 09/24/2010    Past Surgical History:  Procedure Laterality Date   BREAST SURGERY     CARDIAC CATHETERIZATION  05/04/2007   reveals overall normal left ventricular systolic function. Ejection fraction 65-70%   CATARACT EXTRACTION W/PHACO Right 07/20/2013   Procedure: CATARACT EXTRACTION PHACO AND INTRAOCULAR LENS PLACEMENT (IOC);  Surgeon: Ben Bracken, MD;  Location: [facility] OR;  Service: Ophthalmology;  Laterality: Right;    COLONOSCOPY W/ BIOPSIES AND POLYPECTOMY     COLONOSCOPY WITH PROPOFOL  N/A 09/07/2014   Procedure: COLONOSCOPY WITH PROPOFOL ;  Surgeon: Tami Falcon, MD;  Location: [facility];  Service: Endoscopy;  Laterality: N/A;   COLONOSCOPY WITH PROPOFOL  N/A 01/10/2020   Procedure: COLONOSCOPY WITH PROPOFOL ;  Surgeon: Tami Falcon, MD;  Location: [facility];  Service: Endoscopy;  Laterality: N/A;   CORONARY ANGIOPLASTY WITH STENT PLACEMENT  01/15/2017   CORONARY ATHERECTOMY N/A 01/15/2017   Procedure: CORONARY ATHERECTOMY;  Surgeon: Sammy Crisp, MD;  Location: [facility];  Service: Cardiovascular;  Laterality: N/A;   CORONARY BALLOON ANGIOPLASTY N/A 01/15/2017   Procedure: CORONARY BALLOON ANGIOPLASTY;  Surgeon: Sammy Crisp, MD;  Location: [facility];  Service: Cardiovascular;  Laterality: N/A;   CORONARY PRESSURE/FFR STUDY N/A 12/12/2016   Procedure: INTRAVASCULAR PRESSURE WIRE/FFR STUDY;  Surgeon: Sammy Crisp, MD;  Location: [facility];  Service: Cardiovascular;  Laterality: N/A;   CORONARY STENT INTERVENTION N/A 01/15/2017   Procedure: CORONARY STENT INTERVENTION;  Surgeon: Sammy Crisp, MD;  Location: [facility];  Service: Cardiovascular;  Laterality: N/A;   ESOPHAGOGASTRODUODENOSCOPY (EGD) WITH PROPOFOL  N/A 09/07/2014   Procedure: ESOPHAGOGASTRODUODENOSCOPY (EGD) WITH PROPOFOL ;  Surgeon: Tami Falcon,  MD;  Location: [facility];  Service: Endoscopy;  Laterality: N/A;   EXTERNAL EAR SURGERY Bilateral 1970s   tumors removed   EYE SURGERY Left 2019   cataract extraction    EYE SURGERY Right 02/09/2018   eyelid surgery    MINI SHUNT INSERTION Right 07/20/2013   Procedure: INSERTION OF GLAUCOMA FILTRATION DEVICE RIGHT EYE;  Surgeon: Ben Bracken, MD;  Location: [facility] OR;  Service: Ophthalmology;  Laterality: Right;   MITOMYCIN  C APPLICATION Right 07/20/2013   Procedure: MITOMYCIN  C APPLICATION;  Surgeon: Ben Bracken, MD;  Location: [facility] OR;  Service:  Ophthalmology;  Laterality: Right;   MITOMYCIN  C APPLICATION Right 02/21/2015   Procedure: MITOMYCIN  C APPLICATION RIGHT EYE;  Surgeon: Ben Bracken, MD;  Location: [facility] OR;  Service: Ophthalmology;  Laterality: Right;   PLACEMENT OF BREAST IMPLANTS Bilateral 1992   "took all my breast tissue out; put implants in;fibrocystic breast disease "   POLYPECTOMY  01/10/2020   Procedure: POLYPECTOMY;  Surgeon: Tami Falcon, MD;  Location: [facility];  Service: Endoscopy;;   RIGHT/LEFT HEART CATH AND CORONARY ANGIOGRAPHY N/A 12/12/2016   Procedure: RIGHT/LEFT HEART CATH AND CORONARY ANGIOGRAPHY;  Surgeon: Sammy Crisp, MD;  Location: [facility];  Service: Cardiovascular;  Laterality: N/A;   TONSILLECTOMY     TOTAL ABDOMINAL HYSTERECTOMY  1982   TRABECULECTOMY Right 02/21/2015   Procedure: TRABECULECTOMY WITH [facility] ON THE RIGHT EYE;  Surgeon: Ben Bracken, MD;  Location: [facility] OR;  Service: Ophthalmology;  Laterality: Right;    OB History     Gravida  2   Para  2   Term      Preterm      AB      Living  2      SAB      IAB      Ectopic      Multiple      Live Births               Home Medications    Prior to Admission medications   Medication Sig Start Date End Date Taking? Authorizing Provider  acetaminophen  (TYLENOL ) 500 MG tablet Take 1,000 mg by mouth 2 (two) times daily as needed for moderate pain or headache.    [provider]  albuterol  (PROVENTIL ) (2.5 MG/3ML) 0.083% nebulizer solution Take 3 mLs (2.5 mg total) by nebulization daily as needed for wheezing or shortness of breath. 04/01/21   Susanna Epley, FNP  albuterol  (VENTOLIN  HFA) 108 (90 Base) MCG/ACT inhaler Inhale 2 puffs into the lungs every 6 (six) hours as needed for wheezing or shortness of breath. 12/30/19   Faustina Hood, MD  amoxicillin -clavulanate (AUGMENTIN ) 875-125 MG tablet Take 1 tablet by mouth every 12 (twelve) hours for 7 days. 07/06/23 07/13/23  Maricela Kawahara E, PA-C  ANORO ELLIPTA   62.5-25 MCG/ACT AEPB INHALE 1 PUFF INTO THE LUNGS DAILY AT 6 AM 02/21/22   [name], Praveen, MD  aspirin  EC 81 MG tablet Take 81 mg by mouth daily. Swallow whole.    [provider]  atorvastatin  (LIPITOR) 80 MG tablet Take 1 tablet (80 mg total) by mouth daily. 02/24/23   Walker, Caitlin S, NP  Blood Glucose Monitoring Suppl (ACCU-CHEK AVIVA PLUS) w/Device KIT Use to check blood sugars 3 times a day. Dx code e11.65 01/03/20   Cleave Curling, MD  Blood Glucose Monitoring Suppl (ACCU-CHEK GUIDE) w/Device KIT USE AS NEEDED TO CHECK BLOOD SUGARS. 04/16/23   Cleave Curling, MD  brimonidine  (ALPHAGAN  P) 0.1 %  SOLN Place 1 drop into both eyes in the morning, at noon, and at bedtime.    [provider]  brinzolamide  (AZOPT ) 1 % ophthalmic suspension Place 1 drop into both eyes 3 (three) times daily.    [provider]  carvedilol  (COREG ) 12.5 MG tablet Take 1 tablet (12.5 mg total) by mouth 2 (two) times daily with a meal. 06/19/23   Maudine Sos, MD  cetirizine  (ZYRTEC  ALLERGY) 10 MG tablet Take 1 tablet (10 mg total) by mouth daily. Patient not taking: Reported on 06/09/2023 05/06/23 05/05/24  Cleave Curling, MD  EPINEPHrine  0.3 mg/0.3 mL IJ SOAJ injection Inject 0.3 mg into the muscle as needed for anaphylaxis. 05/06/23   Cleave Curling, MD  ezetimibe  (ZETIA ) 10 MG tablet Take 1 tablet (10 mg total) by mouth daily. 01/01/23   [name], Lela Purple, MD  furosemide  (LASIX ) 40 MG tablet Take 1 tablet (40 mg total) by mouth daily as needed. 03/28/22   [name], Lela Purple, MD  ibuprofen  (ADVIL ) 600 MG tablet Take 1 tablet (600 mg total) by mouth every 6 (six) hours as needed. Patient not taking: Reported on 06/09/2023 01/01/23   Lesle Ras, MD  isosorbide  mononitrate (IMDUR ) 60 MG 24 hr tablet TAKE 1 TABLET(60 MG) BY MOUTH DAILY 06/19/23   Maudine Sos, MD  JANUMET  50-500 MG tablet TAKE 1 TABLET BY MOUTH TWICE DAILY WITH A MEAL 06/09/23   Cleave Curling, MD  ketorolac  (ACULAR ) 0.5 %  ophthalmic solution Place 1 drop into the right eye 2 (two) times daily. 08/02/20   [provider]  Lancets (ONETOUCH DELICA PLUS LANCET33G) MISC CHECK BLOOD SUGAR BEFORE BREAKFAST AND DINNER 09/12/20   Ghumman, Ramandeep, NP  loperamide  (IMODIUM ) 2 MG capsule Take 1 capsule (2 mg total) by mouth 4 (four) times daily as needed for diarrhea or loose stools. Patient not taking: Reported on 05/06/2023 11/07/22   Edson Graces, MD  montelukast  (SINGULAIR ) 10 MG tablet TAKE 1 TABLET BY MOUTH EVERY DAY 02/17/22   Cleave Curling, MD  Nebulizers (COMPRESSOR/NEBULIZER) MISC Use as directed Patient not taking: Reported on 05/06/2023 04/22/18   Faustina Hood, MD  nitroGLYCERIN  (NITROSTAT ) 0.4 MG SL tablet Place 1 tablet (0.4 mg total) under the tongue every 5 (five) minutes as needed for chest pain. 07/25/19 09/21/23  Cleave Curling, MD  ondansetron  (ZOFRAN -ODT) 4 MG disintegrating tablet Take 1 tablet (4 mg total) by mouth every 8 (eight) hours as needed for nausea or vomiting. 12/06/22   Rai, Ripudeep Linnell Richardson, MD  pantoprazole  (PROTONIX ) 40 MG tablet Take 1 tablet (40 mg total) by mouth daily. 06/19/23   Maudine Sos, MD  Polyvinyl Alcohol -Povidone PF (REFRESH) 1.4-0.6 % SOLN Place 1-2 drops into both eyes 3 (three) times daily as needed (dry/irritated eyes.).    [provider]  potassium chloride  SA (KLOR-CON ) 20 MEQ tablet Take 1 tablet (20 mEq total) by mouth every other day. 06/16/19   [name], Lela Purple, MD  prednisoLONE acetate (PRED FORTE) 1 % ophthalmic suspension Place 1 drop into both eyes in the morning, at noon, and at bedtime. Place 2 Drops in Left Eye, Place 3 Drops In Right Eye TID 10/11/20   [provider]  pregabalin  (LYRICA ) 75 MG capsule TAKE 1 CAPSULE(75 MG) BY MOUTH TWICE DAILY 02/12/23   Cleave Curling, MD  RHOPRESSA  0.02 % SOLN Place 1 drop into the left eye at bedtime. 05/27/21   [provider]  tiZANidine  (ZANAFLEX ) 4 MG tablet Take 0.5 tablets (2 mg total) by  mouth every 6 (six) hours as needed for muscle spasms. Patient not taking: Reported on 05/06/2023 01/01/23   Lesle Ras, MD    Family History Family History  Problem Relation Age of Onset   Heart disease Father    Diabetes Father    Glaucoma Father    Cancer Mother        unknown type, ?lung   Heart disease Paternal Grandmother    Cancer Paternal Grandmother        unknown type   Diabetes Brother    Glaucoma Brother     Social History Social History   Tobacco Use   Smoking status: Former    Current packs/day: 0.00    Average packs/day: 1 pack/day for 4.0 years (4.0 ttl pk-yrs)    Types: Cigarettes    Start date: 02/11/1976    Quit date: 02/11/1980    Years since quitting: 43.4   Smokeless tobacco: Never  Vaping Use   Vaping status: Never Used  Substance Use Topics   Alcohol  use: No   Drug use: No     Allergies   Crestor [rosuvastatin calcium ], Demerol  [meperidine hcl], and Shellfish allergy   Review of Systems Review of Systems  Constitutional:  Positive for chills, fatigue and fever.  HENT:  Positive for congestion, ear pain, postnasal drip, rhinorrhea and sore throat.   Respiratory:  Positive for cough. Negative for shortness of breath and wheezing.   Gastrointestinal:  Positive for diarrhea. Negative for nausea and vomiting.  Musculoskeletal:  Positive for neck pain.  Skin:  Negative for rash.     Physical Exam Triage Vital Signs ED Triage Vitals  Encounter Vitals Group     BP 07/06/23 1536 (!) 159/97     Systolic BP Percentile --      Diastolic BP Percentile --      Pulse Rate 07/06/23 1536 82     Resp 07/06/23 1536 18     Temp 07/06/23 1536 98.1 F (36.7 C)     Temp Source 07/06/23 1536 Oral     SpO2 07/06/23 1536 98 %     Weight 07/06/23 1541 170 lb (77.1 kg)     Height 07/06/23 1541 5\' 2"  (1.575 m)     Head Circumference --      Peak Flow --      Pain Score 07/06/23 1538 7     Pain Loc --      Pain Education --      Exclude from Growth  Chart --    No data found.  Updated Vital Signs BP (!) 159/97 (BP Location: Right Arm) Comment: pt states she took her BP medication today at noon.  Pulse 82   Temp 98.1 F (36.7 C) (Oral)   Resp 18   Ht 5\' 2"  (1.575 m)   Wt 170 lb (77.1 kg)   SpO2 98%   BMI 31.09 kg/m   Visual Acuity Right Eye Distance:   Left Eye Distance:   Bilateral Distance:    Right Eye Near:   Left Eye Near:    Bilateral Near:     Physical Exam Vitals reviewed.  Constitutional:      General: She is awake. She is not in acute distress.    Appearance: Normal appearance. She is well-developed and well-groomed. She is not ill-appearing or toxic-appearing.  HENT:     Head: Normocephalic and atraumatic.     Right Ear: Hearing, tympanic membrane and ear canal normal.     Left  Ear: Hearing, tympanic membrane and ear canal normal.     Mouth/Throat:     Lips: Pink.     Mouth: Mucous membranes are moist.     Pharynx: Oropharynx is clear. Uvula midline. No pharyngeal swelling, oropharyngeal exudate, posterior oropharyngeal erythema, uvula swelling or postnasal drip.  Cardiovascular:     Rate and Rhythm: Normal rate and regular rhythm.     Pulses: Normal pulses.          Radial pulses are 2+ on the right side and 2+ on the left side.     Heart sounds: Normal heart sounds. No murmur heard.    No friction rub. No gallop.  Pulmonary:     Effort: Pulmonary effort is normal.     Breath sounds: Normal breath sounds. No decreased air movement. No decreased breath sounds, wheezing, rhonchi or rales.  Musculoskeletal:     Cervical back: Normal range of motion and neck supple.  Lymphadenopathy:     Head:     Right side of head: No submental, submandibular or preauricular adenopathy.     Left side of head: No submental, submandibular or preauricular adenopathy.     Cervical:     Right cervical: No superficial cervical adenopathy.    Left cervical: No superficial cervical adenopathy.     Upper Body:     Right  upper body: No supraclavicular adenopathy.     Left upper body: No supraclavicular adenopathy.  Neurological:     General: No focal deficit present.     Mental Status: She is alert and oriented to person, place, and time.  Psychiatric:        Mood and Affect: Mood normal.        Behavior: Behavior normal. Behavior is cooperative.        Thought Content: Thought content normal.        Judgment: Judgment normal.      UC Treatments / Results  Labs (all labs ordered are listed, but only abnormal results are displayed) Labs Reviewed - No data to display  EKG   Radiology No results found.  Procedures Procedures (including critical care time)  Medications Ordered in UC Medications  triamcinolone  acetonide (KENALOG -40) injection 40 mg (has no administration in time range)    Initial Impression / Assessment and Plan / UC Course  I have reviewed the triage vital signs and the nursing notes.  Pertinent labs & imaging results that were available during my care of the patient were reviewed by me and considered in my medical decision making (see chart for details).      Final Clinical Impressions(s) / UC Diagnoses   Final diagnoses:  Acute pansinusitis, recurrence not specified  Torticollis, acute   Patient presents today with concerns of coughing, nasal congestion, runny nose and fever that has been ongoing for about 6 days.  She reports that her cough is productive and she is also having some pain on the left side of her neck.  She states that she has tried taking Singulair , Zyrtec  and Tylenol  to assist with symptoms but this has not provided adequate relief.  Patient is outside the window for flu or COVID antivirals so testing was not completed today.  Physical exam and vitals are overall reassuring at this time.  Given chronicity of symptoms and lack of improvement with over-the-counter medications will send in Augmentin  p.o. twice daily x 7 days for potential sinus infection.    With regards to neck pain suspect torticollis but cannot rule  out generalized body aches.  Patient has EGFR less than 60 so I do not recommend starting or administering NSAIDs at this time.  Will instead offer Kenalog  40 mg injection.  Most recent hemoglobin A1c was at 6.2% as of 04/15/2023. Recommend expanding over-the-counter medication use to include Robitussin and Mucinex  as well as regular Tylenol  administered at even intervals throughout the day.  ED and return precautions reviewed and provided in after visit summary.  Follow-up as needed.    Discharge Instructions      Based on your symptoms and duration of illness, I believe you may have a bacterial sinus infection  These typically resolve with antibiotic therapy along with at-home comfort measures  Today I have sent in a prescription for  Augmentin  875-125 mg to be taken by mouth twice per day for 7 days FINISH THE ENTIRE COURSE unless you develop an allergic reaction or are instructed to discontinue.  It can take a few days for the antibiotic to kick in so I recommend symptomatic relief with over the counter medication such as the following: Dayquil/ Nyquil Theraflu Alkaseltzer   If you have high blood pressure I recommend that you take Mucinex , Robitussin, Tylenol  instead of the combination medications.  Combination medications typically have a decongestant in them that can cause your blood pressure to be high.  These medications typically have Tylenol  in them already so you do not need to supplement with more outside of those medications  Stay well hydrated with at least 75 oz of water per day to help with recovery  If at any point you start to develop swelling around the eyes and nose, trouble seeing, fevers that are not responding to medications, trouble breathing, passing out or headaches that are very severe please go to the emergency room for further evaluation management    ED Prescriptions     Medication Sig Dispense  Auth. Provider   amoxicillin -clavulanate (AUGMENTIN ) 875-125 MG tablet  (Status: Discontinued) Take 1 tablet by mouth every 12 (twelve) hours for 7 days. 14 tablet Aison Malveaux E, PA-C   amoxicillin -clavulanate (AUGMENTIN ) 875-125 MG tablet Take 1 tablet by mouth every 12 (twelve) hours for 7 days. 14 tablet Arrion Burruel E, PA-C      PDMP not reviewed this encounter.   Jerona Mooring, PA-C 07/06/23 1844    Ricca Melgarejo, Pearla Bottom, PA-C 07/06/23 1846

## 2023-07-06 NOTE — Discharge Instructions (Signed)
 Based on your symptoms and duration of illness, I believe you may have a bacterial sinus infection  These typically resolve with antibiotic therapy along with at-home comfort measures  Today I have sent in a prescription for  Augmentin 875-125 mg to be taken by mouth twice per day for 7 days FINISH THE ENTIRE COURSE unless you develop an allergic reaction or are instructed to discontinue.  It can take a few days for the antibiotic to kick in so I recommend symptomatic relief with over the counter medication such as the following: Dayquil/ Nyquil Theraflu Alkaseltzer   If you have high blood pressure I recommend that you take Mucinex, Robitussin, Tylenol instead of the combination medications.  Combination medications typically have a decongestant in them that can cause your blood pressure to be high.  These medications typically have Tylenol in them already so you do not need to supplement with more outside of those medications  Stay well hydrated with at least 75 oz of water per day to help with recovery  If at any point you start to develop swelling around the eyes and nose, trouble seeing, fevers that are not responding to medications, trouble breathing, passing out or headaches that are very severe please go to the emergency room for further evaluation management

## 2023-07-07 ENCOUNTER — Other Ambulatory Visit: Payer: Self-pay | Admitting: Pulmonary Disease

## 2023-07-08 NOTE — Progress Notes (Signed)
 Reschedule call with [facility]

## 2023-07-14 ENCOUNTER — Ambulatory Visit (HOSPITAL_BASED_OUTPATIENT_CLINIC_OR_DEPARTMENT_OTHER): Attending: Cardiovascular Disease

## 2023-07-14 ENCOUNTER — Encounter (HOSPITAL_BASED_OUTPATIENT_CLINIC_OR_DEPARTMENT_OTHER): Payer: Self-pay

## 2023-07-14 DIAGNOSIS — R2681 Unsteadiness on feet: Secondary | ICD-10-CM | POA: Diagnosis not present

## 2023-07-14 DIAGNOSIS — R2689 Other abnormalities of gait and mobility: Secondary | ICD-10-CM | POA: Insufficient documentation

## 2023-07-14 DIAGNOSIS — R262 Difficulty in walking, not elsewhere classified: Secondary | ICD-10-CM | POA: Insufficient documentation

## 2023-07-14 DIAGNOSIS — M6281 Muscle weakness (generalized): Secondary | ICD-10-CM | POA: Diagnosis not present

## 2023-07-14 NOTE — Therapy (Signed)
 OUTPATIENT PHYSICAL THERAPY LOWER EXTREMITY TREATMENT   Patient Name: Sheila Reynolds MRN: [id number] DOB:[birth year], 78 y.o., female Today's Date: 07/14/2023  END OF SESSION:  PT End of Session - 07/14/23 1101     Visit Number 5    Number of Visits 18    Date for PT Re-Evaluation 08/30/23    Authorization Type MCR    PT Start Time 1017    PT Stop Time 1057    PT Time Calculation (min) 40 min    Equipment Utilized During Treatment Gait belt    Activity Tolerance Patient tolerated treatment well    Behavior During Therapy WFL for tasks assessed/performed                Past Medical History:  Diagnosis Date   Anemia    3 months ago anemic   Anxiety    on meds   Arthritis    "all over" (01/15/2017)   Asthma    Bronchitis with emphysema    Chest pain    Chronic bronchitis (HCC)    Coronary artery disease    a. 01/2017 she underwent orbital atherectomy/DES to the proxmal LAD and PTCA to ostial D2. 2D Echo 01/15/17 showed mild LVH, EF 60-65%, grade 1 DD.   Family history of anesthesia complication    daughter N/V   Fibromyalgia    GERD (gastroesophageal reflux disease)    on meds   Glaucoma    Heart murmur    History of hiatal hernia    Hx of echocardiogram    Echo (03/2013):  Tech limited; Mild focal basal septal hypertrophy, EF 60-65%, normal RVF   Hyperlipidemia    Hypertension    Nonsustained ventricular tachycardia (HCC)    OSA on CPAP    Premature atrial contractions    PVC (premature ventricular contraction)    a. Holter 12/16: NSR, occ PAC,PVCs   Sarcoidosis    Type II diabetes mellitus (HCC)    Past Surgical History:  Procedure Laterality Date   BREAST SURGERY     CARDIAC CATHETERIZATION  05/04/2007   reveals overall normal left ventricular systolic function. Ejection fraction 65-70%   CATARACT EXTRACTION W/PHACO Right 07/20/2013   Procedure: CATARACT EXTRACTION PHACO AND INTRAOCULAR LENS PLACEMENT (IOC);  Surgeon: Ben Bracken, MD;  Location: [facility]  OR;  Service: Ophthalmology;  Laterality: Right;   COLONOSCOPY W/ BIOPSIES AND POLYPECTOMY     COLONOSCOPY WITH PROPOFOL  N/A 09/07/2014   Procedure: COLONOSCOPY WITH PROPOFOL ;  Surgeon: Tami Falcon, MD;  Location: [facility];  Service: Endoscopy;  Laterality: N/A;   COLONOSCOPY WITH PROPOFOL  N/A 01/10/2020   Procedure: COLONOSCOPY WITH PROPOFOL ;  Surgeon: Tami Falcon, MD;  Location: [facility];  Service: Endoscopy;  Laterality: N/A;   CORONARY ANGIOPLASTY WITH STENT PLACEMENT  01/15/2017   CORONARY ATHERECTOMY N/A 01/15/2017   Procedure: CORONARY ATHERECTOMY;  Surgeon: Sammy Crisp, MD;  Location: [facility];  Service: Cardiovascular;  Laterality: N/A;   CORONARY BALLOON ANGIOPLASTY N/A 01/15/2017   Procedure: CORONARY BALLOON ANGIOPLASTY;  Surgeon: Sammy Crisp, MD;  Location: [facility];  Service: Cardiovascular;  Laterality: N/A;   CORONARY PRESSURE/FFR STUDY N/A 12/12/2016   Procedure: INTRAVASCULAR PRESSURE WIRE/FFR STUDY;  Surgeon: Sammy Crisp, MD;  Location: [facility];  Service: Cardiovascular;  Laterality: N/A;   CORONARY STENT INTERVENTION N/A 01/15/2017   Procedure: CORONARY STENT INTERVENTION;  Surgeon: Sammy Crisp, MD;  Location: [facility];  Service: Cardiovascular;  Laterality: N/A;   ESOPHAGOGASTRODUODENOSCOPY (EGD) WITH  PROPOFOL  N/A 09/07/2014   Procedure: ESOPHAGOGASTRODUODENOSCOPY (EGD) WITH PROPOFOL ;  Surgeon: Tami Falcon, MD;  Location: [facility];  Service: Endoscopy;  Laterality: N/A;   EXTERNAL EAR SURGERY Bilateral 1970s   tumors removed   EYE SURGERY Left 2019   cataract extraction    EYE SURGERY Right 02/09/2018   eyelid surgery    MINI SHUNT INSERTION Right 07/20/2013   Procedure: INSERTION OF GLAUCOMA FILTRATION DEVICE RIGHT EYE;  Surgeon: Ben Bracken, MD;  Location: [facility] OR;  Service: Ophthalmology;  Laterality: Right;   MITOMYCIN  C APPLICATION Right 07/20/2013   Procedure: MITOMYCIN  C APPLICATION;  Surgeon: Ben Bracken, MD;  Location: [facility] OR;  Service: Ophthalmology;  Laterality: Right;   MITOMYCIN  C APPLICATION Right 02/21/2015   Procedure: MITOMYCIN  C APPLICATION RIGHT EYE;  Surgeon: Ben Bracken, MD;  Location: [facility] OR;  Service: Ophthalmology;  Laterality: Right;   PLACEMENT OF BREAST IMPLANTS Bilateral 1992   "took all my breast tissue out; put implants in;fibrocystic breast disease "   POLYPECTOMY  01/10/2020   Procedure: POLYPECTOMY;  Surgeon: Tami Falcon, MD;  Location: [facility];  Service: Endoscopy;;   RIGHT/LEFT HEART CATH AND CORONARY ANGIOGRAPHY N/A 12/12/2016   Procedure: RIGHT/LEFT HEART CATH AND CORONARY ANGIOGRAPHY;  Surgeon: Sammy Crisp, MD;  Location: [facility];  Service: Cardiovascular;  Laterality: N/A;   TONSILLECTOMY     TOTAL ABDOMINAL HYSTERECTOMY  1982   TRABECULECTOMY Right 02/21/2015   Procedure: TRABECULECTOMY WITH [facility] ON THE RIGHT EYE;  Surgeon: Ben Bracken, MD;  Location: [facility] OR;  Service: Ophthalmology;  Laterality: Right;   Patient Active Problem List   Diagnosis Date Noted   Interstitial lung disease (HCC) 06/09/2023   Myalgia 04/28/2023   Near syncope 04/15/2023   Viral gastroenteritis 04/15/2023   Postconcussion syndrome 12/16/2022   Syncope 12/03/2022   Acute lower UTI 12/03/2022   Cavitating mass in right upper lung lobe 12/03/2022   Dyslipidemia 12/03/2022   Coronary artery disease 12/03/2022   Chronic obstructive pulmonary disease (COPD) (HCC) 12/03/2022   Cavitary pneumonia 12/03/2022   Atherosclerosis of aorta (HCC) 10/05/2022   Left leg pain 10/05/2022   Pain of toe of left foot 10/05/2022   Current moderate episode of major depressive disorder, unspecified whether recurrent (HCC) 09/22/2022   Abdominal hernia 05/15/2022   Candidal vulvovaginitis 05/15/2022   Fracture of foot 05/15/2022   Hearing loss 05/15/2022   Hypertensive heart disease without heart failure 05/15/2022   Menopausal syndrome 05/15/2022   Other long term  (current) drug therapy 05/15/2022   Postherpetic trigeminal neuralgia 05/15/2022   Swollen abdomen 05/15/2022   Type 2 diabetes mellitus with hyperglycemia (HCC) 05/15/2022   Urinary incontinence 05/15/2022   Partial symptomatic epilepsy with complex partial seizures, not intractable, without status epilepticus (HCC) 05/15/2022   Diabetic polyneuropathy associated with type 2 diabetes mellitus (HCC) 12/15/2021   Oxygen  dependent 12/15/2021   History of fall 12/15/2021   Chronic arthritis 12/15/2021   Dyslipidemia associated with type 2 diabetes mellitus (HCC) 11/05/2021   Generalized weakness 11/05/2021   Pneumonitis 11/05/2021   Postnasal drip 10/18/2021   Low hemoglobin 10/18/2021   Positive ANA (antinuclear antibody) 10/18/2021   BMI 29.0-29.9,adult 10/18/2021   IPF (idiopathic pulmonary fibrosis) (HCC) 08/20/2021   Urinary frequency 06/19/2021   Urinary tract infection without hematuria 06/19/2021   Imbalance 06/19/2021   Memory changes 06/19/2021   Drug therapy 06/19/2021   Back pain 05/24/2020   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 heart failure (HCC) 04/16/2020   Chronic  bronchitis with COPD (chronic obstructive pulmonary disease) (HCC) 04/13/2020   Chronic diastolic heart failure (HCC) 03/28/2020   Uncontrolled type 2 diabetes mellitus with hyperglycemia (HCC) 07/25/2019   Hiatal hernia 07/25/2019   Spontaneous ecchymoses 07/25/2019   Class 1 obesity due to excess calories with serious comorbidity and body mass index (BMI) of 30.0 to 30.9 in adult 07/25/2019   Peripheral vascular disease, unspecified (HCC) 03/23/2019   Uveitis 06/01/2017   Fibromyalgia 06/01/2017   DDD (degenerative disc disease), lumbar 06/01/2017   DDD (degenerative disc disease), thoracic 06/01/2017   History of hypercholesterolemia 06/01/2017   History of anxiety 06/01/2017   History of IBS 06/01/2017   Premature atrial contractions 03/30/2017   PVC's (premature ventricular  contractions) 03/30/2017   Diabetes mellitus without complication (HCC) 01/28/2017   Angina pectoris (HCC) 01/15/2017   CAD (coronary artery disease) 01/12/2017   Shortness of breath 12/12/2016   Nuclear sclerotic cataract of left eye 11/16/2015   Status post eye surgery 11/16/2015   Primary open angle glaucoma of left eye, moderate stage 10/31/2015   Chronic iritis of both eyes 08/22/2015   Cystoid macular edema of right eye 08/22/2015   Acute maxillary sinusitis 05/03/2014   Obstructive sleep apnea 11/04/2013   Multiple lung nodules 07/18/2012   Heart palpitations 03/11/2012   HTN (hypertension) 03/11/2012   Unspecified glaucoma 08/29/2011   Gastroesophageal reflux disease without esophagitis 07/13/2011   Sarcoidosis 02/08/2011   COPD with acute exacerbation (HCC) 02/05/2011   Hyperlipemia 09/24/2010   Atypical chest pain 09/24/2010   Leg edema 09/24/2010    PCP: Cleave Curling, MD   REFERRING PROVIDER:  Maudine Sos, MD       REFERRING DIAG:   R53.1 (ICD-10-CM) - Weakness  R26.89 (ICD-10-CM) - Balance problem    THERAPY DIAG:  Other abnormalities of gait and mobility  Unsteadiness on feet  Difficulty walking  Muscle weakness (generalized)  Rationale for Evaluation and Treatment: Rehabilitation  ONSET DATE: 2024  SUBJECTIVE:   SUBJECTIVE STATEMENT: goes by "Vee"  Pt reports she travelled to [place] last weekend for a family event. Her balance felt worse due to being in a different environment. Denies falls.   Eval:   Pt reports she was doing therapy about a year ago due to falls and balance issues. She has issues with bending down due to falling. Pt feels stiffness in the legs that limits ability to lift feet up. Pt reports history of blacking out and passing out. She has not had an episode of 6 months. Pt feels lightheaded and "something is wrong" before she passes out. She  does feel this consisently each of the 3x times she passes out. Has a history of  4 mini-strokes. Pt feels she looses her balance a couple times day. She feels like she tips forward. Pt does have bilat hip and knee pain. Pt feels that back pain will cause her to chest pain, happens with long term standing. Pt does have neuropathy in both feet but is controlled. Pt does endorse catching her feet on the ground and difficulty with uneven surfaces. Pt denies lightheadedness or room spinning sensations.   PERTINENT HISTORY: Sarcoidosis, headaches, NSVT, HLD, leg edema, HTN, OSA, CAD, angina, DM2, PACs, PVCs, Fibromyalgia, PVD, chronic diastolic heart failure, recent diagnosis of pulmonary fibrosis  PAIN:  Are you having pain? Yes: NPRS scale: 4/10 Pain location: chest, back, bilat knees, and hips, and hands  Pain description: aching, sharp Aggravating factors: movement, standing, leaning fwd  Relieving factors: meds  PRECAUTIONS: Falls  RED FLAGS: None   WEIGHT BEARING RESTRICTIONS: No  FALLS:  Has patient fallen in last 6 months? No, lots of near stumbles  LIVING ENVIRONMENT: Lives with: lives with their spouse Lives in: House/apartment Stairs: Yes  Has following equipment at home: [facility]  OCCUPATION: N/A  PLOF: Independent with basic ADLs and Independent with household mobility with device  PATIENT GOALS: improve her balance and walking, prevent falls; play her great grandson, be able to drive   OBJECTIVE:  Note: Objective measures were completed at Evaluation unless otherwise noted.  DIAGNOSTIC FINDINGS: N/A   BP seated R arm:  119/79  77 HR  FUNCTIONAL OUTCOME MEASURE:   Total ABC score: 500 / 1600 = 31.3 %    Berg Balance Scale = 28  Interpretation: This score is associated with almost 100% fall risk while the patient at the moment may be requiring assistance in performing certain activities of daily living such as walking.    LE  ROM:  LE WFL for tasks assessed; limited and stiff in bilateral hip and knee flexion and ext   LOWER EXTREMITY  MMT:   MMT Right eval Left eval  Hip flexion 4/5 4/5  Hip extension      Hip abduction 4/5 4/5  Hip adduction      Hip internal rotation      Hip external rotation      Knee flexion      Knee extension 4/5 4/5  Ankle dorsiflexion      Ankle plantarflexion      Ankle inversion      Ankle eversion       (Blank rows = not tested)     GAIT: shortened step length; shuffling gait, fwd flexed posture (pt reports leaving cane at home)     STS: Requires UE for stand and sit  Unable to perform 5XSTS   TREATMENT   6/3  Nustep warm up 5 min lvl 5 Standing HR 2x10  Airex SLS with toe touch 30s each Ariex march 2x20 with light CGA Romberg stance on airec 2x30sec Romerg stance on airex with head turns  Step ups onto airex pad 2x10 Side stepping along rail x2laps (min UE support) backwards walking along rail x1 lap (SBA) Sit to stands from elevated plinth 2x10 LAQ 3# x10ea  5/19  Nustep warm up 6 min lvl 3 Standing HR   Airex SLS with toe touch 30s each Ariex march 2x20 Reactive stepping with gait belt perturbations static and walking (all directions)  3x rounds; 2x laps   Edu of balance mechanics and reactive stepping mechanics  5/12  Nustep warm up 5 min lvl 1  NBOS  airex EC 30s 4x NBOS with reach 3x5 in all directions for head hips Step taps 2x20 4"  4" step up 2x10    4/28 Nustep warm up 5 min lvl 1  STS 3x6 Transfer mechanics safety Sidestep at table 4x  Retro step 4x at table NBOS  airex EC 30s 4x Fwd step up on airex with focus on knee flexion and foot clearance 2x10 ea  HEP updates and modifications; posture effects on balance and COG over BOS   4/21  Exercises - Sit to Stand with Counter Support  - 1 x daily - 7 x weekly - 3 sets - 10 reps - Standing Heel Raises  - 1 x daily - 7 x weekly - 3 sets - 10 reps - Mini Squat with Counter Support  - 1  x daily - 7 x weekly - 3 sets - 10 reps       PATIENT EDUCATION:  Education details:  diagnosis, prognosis, anatomy, exercise progression, DOMS expectations, muscle firing,  envelope of function, HEP, POC   Person educated: Patient Education method: Explanation, demonstration Education comprehension: verbalized understanding, returned demonstration   HOME EXERCISE PROGRAM:  Access Code: [email or web address] URL: [email or web address] Date: 06/01/2023 Prepared by: Silver Dross   ASSESSMENT:   CLINICAL IMPRESSION:  Good tlerance for balance challenges, though pt does require SBA/CGA with most tasks. Pt demonstrated difficulty clearing airex pad with step ups initially. Pt complained of mild anterior hip pain with sidestepping. Added 3# ankle weights to LAQ with mild difficutly reported.       OBJECTIVE IMPAIRMENTS: Abnormal gait, cardiopulmonary status limiting activity, decreased activity tolerance, decreased balance, decreased endurance, decreased mobility, difficulty walking, decreased ROM, decreased strength, hypomobility, impaired flexibility, improper body mechanics, postural dysfunction, and pain.    ACTIVITY LIMITATIONS: carrying, lifting, bending, squatting, stairs, transfers, and locomotion level   PARTICIPATION LIMITATIONS: cleaning, laundry, and community activity   PERSONAL FACTORS: Age, Behavior pattern, Fitness, Time since onset of injury/illness/exacerbation, and 1-2 comorbidities:    are also affecting patient's functional outcome.    REHAB POTENTIAL: Fair     CLINICAL DECISION MAKING: Evolving/moderate complexity   EVALUATION COMPLEXITY: Moderate     GOALS:   SHORT TERM GOALS: Target date:  07/13/2023    Pt will become independent with HEP in order to demonstrate synthesis of PT education.   Baseline: Goal status: INITIAL     2.  Pt will report at least 2 pt reduction on NPRS scale for pain in order to demonstrate functional improvement with household activity, self care, and ADL.   Baseline:  Goal status: INITIAL       LONG TERM  GOALS: Target date: POC Date   Pt  will become independent with final HEP in order to demonstrate synthesis of PT education.  Baseline:  Goal status: INITIAL   2.  Pt will be able to demonstrate/report ability to walk >10 mins without pain in order to demonstrate functional improvement and tolerance to exercise and community mobility.    Goal status: INITIAL   3. Pt will decrease 5XSTS by at least 3 seconds in order to demonstrate clinically significant improvement in LE strength   Baseline:  Goal status: INITIAL   4.  Pt will have an at least 45/56 on Berg Balance scale in order to demonstrate improvement above cut off score for risk of falls.     Baseline:  Goal status: INITIAL  5. Pt will be able to demonstrate at least 67% confidence on ABC scale in order to demonstrate meaningful improvement in balance confidence above cut off score for older adults.  Baseline:  Goal status: INITIAL       PLAN:   PT FREQUENCY: 1-2x/week   PT DURATION: 12 wks (plan to DC in 8-10 with HEP)    PLANNED INTERVENTIONS: Therapeutic exercises, Therapeutic activity, Neuromuscular re-education, Balance training, Gait training, Patient/Family education, Self Care, Joint mobilization, Stair training, DME instructions, Cryotherapy, Moist heat, Taping, Manual therapy, and Re-evaluation   PLAN FOR NEXT SESSION: dynamic gait/balance, lumbar/hip strength and ROM     Fronie Jewett Khaza Blansett, PTA 07/14/2023, 11:31 AM

## 2023-07-20 ENCOUNTER — Encounter (HOSPITAL_BASED_OUTPATIENT_CLINIC_OR_DEPARTMENT_OTHER): Payer: Self-pay | Admitting: Physical Therapy

## 2023-07-20 ENCOUNTER — Ambulatory Visit (HOSPITAL_BASED_OUTPATIENT_CLINIC_OR_DEPARTMENT_OTHER): Admitting: Physical Therapy

## 2023-07-20 DIAGNOSIS — M6281 Muscle weakness (generalized): Secondary | ICD-10-CM

## 2023-07-20 DIAGNOSIS — R2689 Other abnormalities of gait and mobility: Secondary | ICD-10-CM | POA: Diagnosis not present

## 2023-07-20 DIAGNOSIS — R2681 Unsteadiness on feet: Secondary | ICD-10-CM

## 2023-07-20 DIAGNOSIS — R262 Difficulty in walking, not elsewhere classified: Secondary | ICD-10-CM

## 2023-07-20 NOTE — Therapy (Signed)
 OUTPATIENT PHYSICAL THERAPY LOWER EXTREMITY TREATMENT   Patient Name: Sheila Reynolds MRN: [id number] DOB:November 21, 1945, 78 y.o., female Today's Date: 07/20/2023  END OF SESSION:  PT End of Session - 07/20/23 1021     Visit Number 6    Number of Visits 18    Date for PT Re-Evaluation 08/30/23    Authorization Type MCR    PT Start Time 1021    PT Stop Time 1055    PT Time Calculation (min) 34 min    Equipment Utilized During Treatment Gait belt    Activity Tolerance Patient tolerated treatment well    Behavior During Therapy WFL for tasks assessed/performed                Past Medical History:  Diagnosis Date   Anemia    3 months ago anemic   Anxiety    on meds   Arthritis    "all over" (01/15/2017)   Asthma    Bronchitis with emphysema    Chest pain    Chronic bronchitis (HCC)    Coronary artery disease    a. 01/2017 she underwent orbital atherectomy/DES to the proxmal LAD and PTCA to ostial D2. 2D Echo 01/15/17 showed mild LVH, EF 60-65%, grade 1 DD.   Family history of anesthesia complication    daughter N/V   Fibromyalgia    GERD (gastroesophageal reflux disease)    on meds   Glaucoma    Heart murmur    History of hiatal hernia    Hx of echocardiogram    Echo (03/2013):  Tech limited; Mild focal basal septal hypertrophy, EF 60-65%, normal RVF   Hyperlipidemia    Hypertension    Nonsustained ventricular tachycardia (HCC)    OSA on CPAP    Premature atrial contractions    PVC (premature ventricular contraction)    a. Holter 12/16: NSR, occ PAC,PVCs   Sarcoidosis    Type II diabetes mellitus (HCC)    Past Surgical History:  Procedure Laterality Date   BREAST SURGERY     CARDIAC CATHETERIZATION  05/04/2007   reveals overall normal left ventricular systolic function. Ejection fraction 65-70%   CATARACT EXTRACTION W/PHACO Right 07/20/2013   Procedure: CATARACT EXTRACTION PHACO AND INTRAOCULAR LENS PLACEMENT (IOC);  Surgeon: Ben Bracken, MD;  Location: [facility]  OR;  Service: Ophthalmology;  Laterality: Right;   COLONOSCOPY W/ BIOPSIES AND POLYPECTOMY     COLONOSCOPY WITH PROPOFOL  N/A 09/07/2014   Procedure: COLONOSCOPY WITH PROPOFOL ;  Surgeon: Tami Falcon, MD;  Location: [facility];  Service: Endoscopy;  Laterality: N/A;   COLONOSCOPY WITH PROPOFOL  N/A 01/10/2020   Procedure: COLONOSCOPY WITH PROPOFOL ;  Surgeon: Tami Falcon, MD;  Location: [facility];  Service: Endoscopy;  Laterality: N/A;   CORONARY ANGIOPLASTY WITH STENT PLACEMENT  01/15/2017   CORONARY ATHERECTOMY N/A 01/15/2017   Procedure: CORONARY ATHERECTOMY;  Surgeon: Sammy Crisp, MD;  Location: [facility];  Service: Cardiovascular;  Laterality: N/A;   CORONARY BALLOON ANGIOPLASTY N/A 01/15/2017   Procedure: CORONARY BALLOON ANGIOPLASTY;  Surgeon: Sammy Crisp, MD;  Location: [facility];  Service: Cardiovascular;  Laterality: N/A;   CORONARY PRESSURE/FFR STUDY N/A 12/12/2016   Procedure: INTRAVASCULAR PRESSURE WIRE/FFR STUDY;  Surgeon: Sammy Crisp, MD;  Location: [facility];  Service: Cardiovascular;  Laterality: N/A;   CORONARY STENT INTERVENTION N/A 01/15/2017   Procedure: CORONARY STENT INTERVENTION;  Surgeon: Sammy Crisp, MD;  Location: [facility];  Service: Cardiovascular;  Laterality: N/A;   ESOPHAGOGASTRODUODENOSCOPY (EGD) WITH  PROPOFOL  N/A 09/07/2014   Procedure: ESOPHAGOGASTRODUODENOSCOPY (EGD) WITH PROPOFOL ;  Surgeon: Tami Falcon, MD;  Location: [facility];  Service: Endoscopy;  Laterality: N/A;   EXTERNAL EAR SURGERY Bilateral 1970s   tumors removed   EYE SURGERY Left 2019   cataract extraction    EYE SURGERY Right 02/09/2018   eyelid surgery    MINI SHUNT INSERTION Right 07/20/2013   Procedure: INSERTION OF GLAUCOMA FILTRATION DEVICE RIGHT EYE;  Surgeon: Ben Bracken, MD;  Location: [facility] OR;  Service: Ophthalmology;  Laterality: Right;   MITOMYCIN  C APPLICATION Right 07/20/2013   Procedure: MITOMYCIN  C APPLICATION;  Surgeon: Ben Bracken, MD;  Location: [facility] OR;  Service: Ophthalmology;  Laterality: Right;   MITOMYCIN  C APPLICATION Right 02/21/2015   Procedure: MITOMYCIN  C APPLICATION RIGHT EYE;  Surgeon: Ben Bracken, MD;  Location: [facility] OR;  Service: Ophthalmology;  Laterality: Right;   PLACEMENT OF BREAST IMPLANTS Bilateral 1992   "took all my breast tissue out; put implants in;fibrocystic breast disease "   POLYPECTOMY  01/10/2020   Procedure: POLYPECTOMY;  Surgeon: Tami Falcon, MD;  Location: [facility];  Service: Endoscopy;;   RIGHT/LEFT HEART CATH AND CORONARY ANGIOGRAPHY N/A 12/12/2016   Procedure: RIGHT/LEFT HEART CATH AND CORONARY ANGIOGRAPHY;  Surgeon: Sammy Crisp, MD;  Location: [facility];  Service: Cardiovascular;  Laterality: N/A;   TONSILLECTOMY     TOTAL ABDOMINAL HYSTERECTOMY  1982   TRABECULECTOMY Right 02/21/2015   Procedure: TRABECULECTOMY WITH [facility] ON THE RIGHT EYE;  Surgeon: Ben Bracken, MD;  Location: [facility] OR;  Service: Ophthalmology;  Laterality: Right;   Patient Active Problem List   Diagnosis Date Noted   Interstitial lung disease (HCC) 06/09/2023   Myalgia 04/28/2023   Near syncope 04/15/2023   Viral gastroenteritis 04/15/2023   Postconcussion syndrome 12/16/2022   Syncope 12/03/2022   Acute lower UTI 12/03/2022   Cavitating mass in right upper lung lobe 12/03/2022   Dyslipidemia 12/03/2022   Coronary artery disease 12/03/2022   Chronic obstructive pulmonary disease (COPD) (HCC) 12/03/2022   Cavitary pneumonia 12/03/2022   Atherosclerosis of aorta (HCC) 10/05/2022   Left leg pain 10/05/2022   Pain of toe of left foot 10/05/2022   Current moderate episode of major depressive disorder, unspecified whether recurrent (HCC) 09/22/2022   Abdominal hernia 05/15/2022   Candidal vulvovaginitis 05/15/2022   Fracture of foot 05/15/2022   Hearing loss 05/15/2022   Hypertensive heart disease without heart failure 05/15/2022   Menopausal syndrome 05/15/2022   Other long term  (current) drug therapy 05/15/2022   Postherpetic trigeminal neuralgia 05/15/2022   Swollen abdomen 05/15/2022   Type 2 diabetes mellitus with hyperglycemia (HCC) 05/15/2022   Urinary incontinence 05/15/2022   Partial symptomatic epilepsy with complex partial seizures, not intractable, without status epilepticus (HCC) 05/15/2022   Diabetic polyneuropathy associated with type 2 diabetes mellitus (HCC) 12/15/2021   Oxygen  dependent 12/15/2021   History of fall 12/15/2021   Chronic arthritis 12/15/2021   Dyslipidemia associated with type 2 diabetes mellitus (HCC) 11/05/2021   Generalized weakness 11/05/2021   Pneumonitis 11/05/2021   Postnasal drip 10/18/2021   Low hemoglobin 10/18/2021   Positive ANA (antinuclear antibody) 10/18/2021   BMI 29.0-29.9,adult 10/18/2021   IPF (idiopathic pulmonary fibrosis) (HCC) 08/20/2021   Urinary frequency 06/19/2021   Urinary tract infection without hematuria 06/19/2021   Imbalance 06/19/2021   Memory changes 06/19/2021   Drug therapy 06/19/2021   Back pain 05/24/2020   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 heart failure (HCC) 04/16/2020   Chronic  bronchitis with COPD (chronic obstructive pulmonary disease) (HCC) 04/13/2020   Chronic diastolic heart failure (HCC) 03/28/2020   Uncontrolled type 2 diabetes mellitus with hyperglycemia (HCC) 07/25/2019   Hiatal hernia 07/25/2019   Spontaneous ecchymoses 07/25/2019   Class 1 obesity due to excess calories with serious comorbidity and body mass index (BMI) of 30.0 to 30.9 in adult 07/25/2019   Peripheral vascular disease, unspecified (HCC) 03/23/2019   Uveitis 06/01/2017   Fibromyalgia 06/01/2017   DDD (degenerative disc disease), lumbar 06/01/2017   DDD (degenerative disc disease), thoracic 06/01/2017   History of hypercholesterolemia 06/01/2017   History of anxiety 06/01/2017   History of IBS 06/01/2017   Premature atrial contractions 03/30/2017   PVC's (premature ventricular  contractions) 03/30/2017   Diabetes mellitus without complication (HCC) 01/28/2017   Angina pectoris (HCC) 01/15/2017   CAD (coronary artery disease) 01/12/2017   Shortness of breath 12/12/2016   Nuclear sclerotic cataract of left eye 11/16/2015   Status post eye surgery 11/16/2015   Primary open angle glaucoma of left eye, moderate stage 10/31/2015   Chronic iritis of both eyes 08/22/2015   Cystoid macular edema of right eye 08/22/2015   Acute maxillary sinusitis 05/03/2014   Obstructive sleep apnea 11/04/2013   Multiple lung nodules 07/18/2012   Heart palpitations 03/11/2012   HTN (hypertension) 03/11/2012   Unspecified glaucoma 08/29/2011   Gastroesophageal reflux disease without esophagitis 07/13/2011   Sarcoidosis 02/08/2011   COPD with acute exacerbation (HCC) 02/05/2011   Hyperlipemia 09/24/2010   Atypical chest pain 09/24/2010   Leg edema 09/24/2010    PCP: Cleave Curling, MD   REFERRING PROVIDER:  Maudine Sos, MD       REFERRING DIAG:   R53.1 (ICD-10-CM) - Weakness  R26.89 (ICD-10-CM) - Balance problem    THERAPY DIAG:  Other abnormalities of gait and mobility  Unsteadiness on feet  Difficulty walking  Muscle weakness (generalized)  Rationale for Evaluation and Treatment: Rehabilitation  ONSET DATE: 2024  SUBJECTIVE:   SUBJECTIVE STATEMENT: goes by "Vee"  Pt states that that she turned on her knee and felt it give way yesterday. Her grandson had to catch her. The pain is medially. The pain is up to 5/10 at rest. Pt states the pain was sharp. Pt was turn to the R when it occurred. No extra swelling at the knee.   Eval:   Pt reports she was doing therapy about a year ago due to falls and balance issues. She has issues with bending down due to falling. Pt feels stiffness in the legs that limits ability to lift feet up. Pt reports history of blacking out and passing out. She has not had an episode of 6 months. Pt feels lightheaded and "something  is wrong" before she passes out. She  does feel this consisently each of the 3x times she passes out. Has a history of 4 mini-strokes. Pt feels she looses her balance a couple times day. She feels like she tips forward. Pt does have bilat hip and knee pain. Pt feels that back pain will cause her to chest pain, happens with long term standing. Pt does have neuropathy in both feet but is controlled. Pt does endorse catching her feet on the ground and difficulty with uneven surfaces. Pt denies lightheadedness or room spinning sensations.   PERTINENT HISTORY: Sarcoidosis, headaches, NSVT, HLD, leg edema, HTN, OSA, CAD, angina, DM2, PACs, PVCs, Fibromyalgia, PVD, chronic diastolic heart failure, recent diagnosis of pulmonary fibrosis  PAIN:  Are you having pain? Yes: NPRS  scale: 4/10 Pain location: chest, back, bilat knees, and hips, and hands  Pain description: aching, sharp Aggravating factors: movement, standing, leaning fwd  Relieving factors: meds   PRECAUTIONS: Falls  RED FLAGS: None   WEIGHT BEARING RESTRICTIONS: No  FALLS:  Has patient fallen in last 6 months? No, lots of near stumbles  LIVING ENVIRONMENT: Lives with: lives with their spouse Lives in: House/apartment Stairs: Yes  Has following equipment at home: [facility]  OCCUPATION: N/A  PLOF: Independent with basic ADLs and Independent with household mobility with device  PATIENT GOALS: improve her balance and walking, prevent falls; play her great grandson, be able to drive   OBJECTIVE:  Note: Objective measures were completed at Evaluation unless otherwise noted.  DIAGNOSTIC FINDINGS: N/A   BP seated R arm:  119/79  77 HR  FUNCTIONAL OUTCOME MEASURE:   Total ABC score: 500 / 1600 = 31.3 %    Berg Balance Scale = 28  Interpretation: This score is associated with almost 100% fall risk while the patient at the moment may be requiring assistance in performing certain activities of daily living such as walking.    LE   ROM:  LE WFL for tasks assessed; limited and stiff in bilateral hip and knee flexion and ext   LOWER EXTREMITY MMT:   MMT Right eval Left eval  Hip flexion 4/5 4/5  Hip extension      Hip abduction 4/5 4/5  Hip adduction      Hip internal rotation      Hip external rotation      Knee flexion      Knee extension 4/5 4/5  Ankle dorsiflexion      Ankle plantarflexion      Ankle inversion      Ankle eversion       (Blank rows = not tested)     GAIT: shortened step length; shuffling gait, fwd flexed posture (pt reports leaving cane at home)     STS: Requires UE for stand and sit  Unable to perform 5XSTS   TREATMENT  6/9  Ligamentous exam intact; TTP along medial HS and quad  Knee ext 2x10 3#  Knee flexion iso 3s 10x Tailgate stretch 5s 10x Seated HS stretch 30s 3x Standing TKE 2x10 Standing HR 2x10  6/3  Nustep warm up 5 min lvl 5 Standing HR 2x10  Airex SLS with toe touch 30s each Ariex march 2x20 with light CGA Romberg stance on airec 2x30sec Romerg stance on airex with head turns  Step ups onto airex pad 2x10 Side stepping along rail x2laps (min UE support) backwards walking along rail x1 lap (SBA) Sit to stands from elevated plinth 2x10 LAQ 3# x10ea  5/19  Nustep warm up 6 min lvl 3 Standing HR   Airex SLS with toe touch 30s each Ariex march 2x20 Reactive stepping with gait belt perturbations static and walking (all directions)  3x rounds; 2x laps   Edu of balance mechanics and reactive stepping mechanics  5/12  Nustep warm up 5 min lvl 1  NBOS  airex EC 30s 4x NBOS with reach 3x5 in all directions for head hips Step taps 2x20 4"  4" step up 2x10    4/28 Nustep warm up 5 min lvl 1  STS 3x6 Transfer mechanics safety Sidestep at table 4x  Retro step 4x at table NBOS  airex EC 30s 4x Fwd step up on airex with focus on knee flexion and foot clearance 2x10 ea  HEP  updates and modifications; posture effects on balance and COG over  BOS   4/21  Exercises - Sit to Stand with Counter Support  - 1 x daily - 7 x weekly - 3 sets - 10 reps - Standing Heel Raises  - 1 x daily - 7 x weekly - 3 sets - 10 reps - Mini Squat with Counter Support  - 1 x daily - 7 x weekly - 3 sets - 10 reps       PATIENT EDUCATION:  Education details: diagnosis, prognosis, anatomy, exercise progression, DOMS expectations, muscle firing,  envelope of function, HEP, POC   Person educated: Patient Education method: Explanation, demonstration Education comprehension: verbalized understanding, returned demonstration   HOME EXERCISE PROGRAM:  Access Code: [email or web address] URL: [email or web address] Date: 06/01/2023 Prepared by: Silver Dross   ASSESSMENT:   CLINICAL IMPRESSION:  Pt pain highly elevated with MOI that appears to be muscle strain related. TTP along medial HS, quad, and gastroc. Ligamentous exam intact. Pt advised on acute inflammatory care and light AROM and stretching provided for home. Plan to see pt at next visit and assess response to exercise. Consider dynamic warm up prior to movement if pain is improved. Too tender to touch today for STM. Consider manual for pain management.       OBJECTIVE IMPAIRMENTS: Abnormal gait, cardiopulmonary status limiting activity, decreased activity tolerance, decreased balance, decreased endurance, decreased mobility, difficulty walking, decreased ROM, decreased strength, hypomobility, impaired flexibility, improper body mechanics, postural dysfunction, and pain.    ACTIVITY LIMITATIONS: carrying, lifting, bending, squatting, stairs, transfers, and locomotion level   PARTICIPATION LIMITATIONS: cleaning, laundry, and community activity   PERSONAL FACTORS: Age, Behavior pattern, Fitness, Time since onset of injury/illness/exacerbation, and 1-2 comorbidities:    are also affecting patient's functional outcome.    REHAB POTENTIAL: Fair     CLINICAL DECISION MAKING: Evolving/moderate  complexity   EVALUATION COMPLEXITY: Moderate     GOALS:   SHORT TERM GOALS: Target date:  07/13/2023    Pt will become independent with HEP in order to demonstrate synthesis of PT education.   Baseline: Goal status: INITIAL     2.  Pt will report at least 2 pt reduction on NPRS scale for pain in order to demonstrate functional improvement with household activity, self care, and ADL.   Baseline:  Goal status: INITIAL       LONG TERM GOALS: Target date: POC Date   Pt  will become independent with final HEP in order to demonstrate synthesis of PT education.  Baseline:  Goal status: INITIAL   2.  Pt will be able to demonstrate/report ability to walk >10 mins without pain in order to demonstrate functional improvement and tolerance to exercise and community mobility.    Goal status: INITIAL   3. Pt will decrease 5XSTS by at least 3 seconds in order to demonstrate clinically significant improvement in LE strength   Baseline:  Goal status: INITIAL   4.  Pt will have an at least 45/56 on Berg Balance scale in order to demonstrate improvement above cut off score for risk of falls.     Baseline:  Goal status: INITIAL  5. Pt will be able to demonstrate at least 67% confidence on ABC scale in order to demonstrate meaningful improvement in balance confidence above cut off score for older adults.  Baseline:  Goal status: INITIAL       PLAN:   PT FREQUENCY: 1-2x/week   PT DURATION: 12 wks (plan to DC in 8-10  with HEP)    PLANNED INTERVENTIONS: Therapeutic exercises, Therapeutic activity, Neuromuscular re-education, Balance training, Gait training, Patient/Family education, Self Care, Joint mobilization, Stair training, DME instructions, Cryotherapy, Moist heat, Taping, Manual therapy, and Re-evaluation   PLAN FOR NEXT SESSION: dynamic gait/balance, lumbar/hip strength and ROM     Silver Dross, PT 07/20/2023, 10:58 AM

## 2023-07-21 ENCOUNTER — Ambulatory Visit: Payer: Self-pay

## 2023-07-21 ENCOUNTER — Ambulatory Visit
Admission: RE | Admit: 2023-07-21 | Discharge: 2023-07-21 | Disposition: A | Discharge status: Home / Self Care | Source: Ambulatory Visit | Attending: Physician Assistant | Admitting: Physician Assistant

## 2023-07-21 ENCOUNTER — Telehealth (HOSPITAL_BASED_OUTPATIENT_CLINIC_OR_DEPARTMENT_OTHER): Payer: Self-pay | Admitting: *Deleted

## 2023-07-21 ENCOUNTER — Ambulatory Visit (INDEPENDENT_AMBULATORY_CARE_PROVIDER_SITE_OTHER): Admitting: Radiology

## 2023-07-21 ENCOUNTER — Other Ambulatory Visit: Payer: Self-pay

## 2023-07-21 ENCOUNTER — Ambulatory Visit (HOSPITAL_BASED_OUTPATIENT_CLINIC_OR_DEPARTMENT_OTHER): Admitting: Physical Therapy

## 2023-07-21 ENCOUNTER — Encounter (HOSPITAL_BASED_OUTPATIENT_CLINIC_OR_DEPARTMENT_OTHER): Payer: Self-pay | Admitting: Physical Therapy

## 2023-07-21 VITALS — BP 182/84 | HR 67 | Temp 98.4°F | Resp 17 | Ht 62.0 in | Wt 160.0 lb

## 2023-07-21 DIAGNOSIS — J0140 Acute pansinusitis, unspecified: Secondary | ICD-10-CM | POA: Diagnosis not present

## 2023-07-21 DIAGNOSIS — R030 Elevated blood-pressure reading, without diagnosis of hypertension: Secondary | ICD-10-CM | POA: Diagnosis not present

## 2023-07-21 DIAGNOSIS — R2681 Unsteadiness on feet: Secondary | ICD-10-CM | POA: Diagnosis not present

## 2023-07-21 DIAGNOSIS — R0989 Other specified symptoms and signs involving the circulatory and respiratory systems: Secondary | ICD-10-CM

## 2023-07-21 DIAGNOSIS — R2689 Other abnormalities of gait and mobility: Secondary | ICD-10-CM | POA: Diagnosis not present

## 2023-07-21 DIAGNOSIS — M6281 Muscle weakness (generalized): Secondary | ICD-10-CM

## 2023-07-21 DIAGNOSIS — R058 Other specified cough: Secondary | ICD-10-CM | POA: Diagnosis not present

## 2023-07-21 DIAGNOSIS — R262 Difficulty in walking, not elsewhere classified: Secondary | ICD-10-CM

## 2023-07-21 MED ORDER — DOXYCYCLINE HYCLATE 100 MG PO CAPS: 100.0000 mg | ORAL_CAPSULE | Freq: Two times a day (BID) | ORAL | 0 refills | Status: AC

## 2023-07-21 NOTE — ED Provider Notes (Signed)
 [facility]    CSN: [id number] Arrival date & time: 07/21/23  1329      History   Chief Complaint Chief Complaint  Patient presents with   Hypertension    HPI Sheila Reynolds is a 78 y.o. female.   HPI Pt presents today with concerns for high blood pressure (states she was at PT today and they checked her BP which was 190/110)  She states she went to see her Cardiology provider and they rechecked her BP and told her to return to UC for blood work in relation to her sinus infection She was seen here on 07/06/23 for sinus infection and prescribed Augmentin  but this has not led to notable relief She reports she is still coughing a lot, she reports she is having productive coughing but she has rhinorrhea as well with yellowish-brown mucus  She reports she is taking Mucinex  as well   She reports she has had a headache for some time  They are unsure how long she has had elevated BP    Past Medical History:  Diagnosis Date   Anemia    3 months ago anemic   Anxiety    on meds   Arthritis    "all over" (01/15/2017)   Asthma    Bronchitis with emphysema    Chest pain    Chronic bronchitis (HCC)    Coronary artery disease    a. 01/2017 she underwent orbital atherectomy/DES to the proxmal LAD and PTCA to ostial D2. 2D Echo 01/15/17 showed mild LVH, EF 60-65%, grade 1 DD.   Family history of anesthesia complication    daughter N/V   Fibromyalgia    GERD (gastroesophageal reflux disease)    on meds   Glaucoma    Heart murmur    History of hiatal hernia    Hx of echocardiogram    Echo (03/2013):  Tech limited; Mild focal basal septal hypertrophy, EF 60-65%, normal RVF   Hyperlipidemia    Hypertension    Nonsustained ventricular tachycardia (HCC)    OSA on CPAP    Premature atrial contractions    PVC (premature ventricular contraction)    a. Holter 12/16: NSR, occ PAC,PVCs   Sarcoidosis    Type II diabetes mellitus [facility])     Patient Active Problem List    Diagnosis Date Noted   Interstitial lung disease (HCC) 06/09/2023   Myalgia 04/28/2023   Near syncope 04/15/2023   Viral gastroenteritis 04/15/2023   Postconcussion syndrome 12/16/2022   Syncope 12/03/2022   Acute lower UTI 12/03/2022   Cavitating mass in right upper lung lobe 12/03/2022   Dyslipidemia 12/03/2022   Coronary artery disease 12/03/2022   Chronic obstructive pulmonary disease (COPD) (HCC) 12/03/2022   Cavitary pneumonia 12/03/2022   Atherosclerosis of aorta (HCC) 10/05/2022   Left leg pain 10/05/2022   Pain of toe of left foot 10/05/2022   Current moderate episode of major depressive disorder, unspecified whether recurrent (HCC) 09/22/2022   Abdominal hernia 05/15/2022   Candidal vulvovaginitis 05/15/2022   Fracture of foot 05/15/2022   Hearing loss 05/15/2022   Hypertensive heart disease without heart failure 05/15/2022   Menopausal syndrome 05/15/2022   Other long term (current) drug therapy 05/15/2022   Postherpetic trigeminal neuralgia 05/15/2022   Swollen abdomen 05/15/2022   Type 2 diabetes mellitus with hyperglycemia (HCC) 05/15/2022   Urinary incontinence 05/15/2022   Partial symptomatic epilepsy with complex partial seizures, not intractable, without status epilepticus (HCC) 05/15/2022   Diabetic polyneuropathy associated with  type 2 diabetes mellitus (HCC) 12/15/2021   Oxygen  dependent 12/15/2021   History of fall 12/15/2021   Chronic arthritis 12/15/2021   Dyslipidemia associated with type 2 diabetes mellitus (HCC) 11/05/2021   Generalized weakness 11/05/2021   Pneumonitis 11/05/2021   Postnasal drip 10/18/2021   Low hemoglobin 10/18/2021   Positive ANA (antinuclear antibody) 10/18/2021   BMI 29.0-29.9,adult 10/18/2021   IPF (idiopathic pulmonary fibrosis) (HCC) 08/20/2021   Urinary frequency 06/19/2021   Urinary tract infection without hematuria 06/19/2021   Imbalance 06/19/2021   Memory changes 06/19/2021   Drug therapy 06/19/2021   Back pain  05/24/2020   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 heart failure (HCC) 04/16/2020   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) (HCC) 04/13/2020   Chronic diastolic heart failure (HCC) 03/28/2020   Uncontrolled type 2 diabetes mellitus with hyperglycemia (HCC) 07/25/2019   Hiatal hernia 07/25/2019   Spontaneous ecchymoses 07/25/2019   Class 1 obesity due to excess calories with serious comorbidity and body mass index (BMI) of 30.0 to 30.9 in adult 07/25/2019   Peripheral vascular disease, unspecified (HCC) 03/23/2019   Uveitis 06/01/2017   Fibromyalgia 06/01/2017   DDD (degenerative disc disease), lumbar 06/01/2017   DDD (degenerative disc disease), thoracic 06/01/2017   History of hypercholesterolemia 06/01/2017   History of anxiety 06/01/2017   History of IBS 06/01/2017   Premature atrial contractions 03/30/2017   PVC's (premature ventricular contractions) 03/30/2017   Diabetes mellitus without complication (HCC) 01/28/2017   Angina pectoris (HCC) 01/15/2017   CAD (coronary artery disease) 01/12/2017   Shortness of breath 12/12/2016   Nuclear sclerotic cataract of left eye 11/16/2015   Status post eye surgery 11/16/2015   Primary open angle glaucoma of left eye, moderate stage 10/31/2015   Chronic iritis of both eyes 08/22/2015   Cystoid macular edema of right eye 08/22/2015   Acute maxillary sinusitis 05/03/2014   Obstructive sleep apnea 11/04/2013   Multiple lung nodules 07/18/2012   Heart palpitations 03/11/2012   HTN (hypertension) 03/11/2012   Unspecified glaucoma 08/29/2011   Gastroesophageal reflux disease without esophagitis 07/13/2011   Sarcoidosis 02/08/2011   COPD with acute exacerbation (HCC) 02/05/2011   Hyperlipemia 09/24/2010   Atypical chest pain 09/24/2010   Leg edema 09/24/2010    Past Surgical History:  Procedure Laterality Date   BREAST SURGERY     CARDIAC CATHETERIZATION  05/04/2007   reveals overall  normal left ventricular systolic function. Ejection fraction 65-70%   CATARACT EXTRACTION W/PHACO Right 07/20/2013   Procedure: CATARACT EXTRACTION PHACO AND INTRAOCULAR LENS PLACEMENT (IOC);  Surgeon: Ben Bracken, MD;  Location: [facility] OR;  Service: Ophthalmology;  Laterality: Right;   COLONOSCOPY W/ BIOPSIES AND POLYPECTOMY     COLONOSCOPY WITH PROPOFOL  N/A 09/07/2014   Procedure: COLONOSCOPY WITH PROPOFOL ;  Surgeon: Tami Falcon, MD;  Location: [facility];  Service: Endoscopy;  Laterality: N/A;   COLONOSCOPY WITH PROPOFOL  N/A 01/10/2020   Procedure: COLONOSCOPY WITH PROPOFOL ;  Surgeon: Tami Falcon, MD;  Location: [facility];  Service: Endoscopy;  Laterality: N/A;   CORONARY ANGIOPLASTY WITH STENT PLACEMENT  01/15/2017   CORONARY ATHERECTOMY N/A 01/15/2017   Procedure: CORONARY ATHERECTOMY;  Surgeon: Sammy Crisp, MD;  Location: [facility];  Service: Cardiovascular;  Laterality: N/A;   CORONARY BALLOON ANGIOPLASTY N/A 01/15/2017   Procedure: CORONARY BALLOON ANGIOPLASTY;  Surgeon: Sammy Crisp, MD;  Location: [facility];  Service: Cardiovascular;  Laterality: N/A;   CORONARY PRESSURE/FFR STUDY N/A 12/12/2016   Procedure: INTRAVASCULAR PRESSURE WIRE/FFR STUDY;  Surgeon: Sammy Crisp, MD;  Location: [facility];  Service: Cardiovascular;  Laterality: N/A;   CORONARY STENT INTERVENTION N/A 01/15/2017   Procedure: CORONARY STENT INTERVENTION;  Surgeon: Sammy Crisp, MD;  Location: [facility];  Service: Cardiovascular;  Laterality: N/A;   ESOPHAGOGASTRODUODENOSCOPY (EGD) WITH PROPOFOL  N/A 09/07/2014   Procedure: ESOPHAGOGASTRODUODENOSCOPY (EGD) WITH PROPOFOL ;  Surgeon: Tami Falcon, MD;  Location: [facility];  Service: Endoscopy;  Laterality: N/A;   EXTERNAL EAR SURGERY Bilateral 1970s   tumors removed   EYE SURGERY Left 2019   cataract extraction    EYE SURGERY Right 02/09/2018   eyelid surgery    MINI SHUNT INSERTION Right 07/20/2013   Procedure:  INSERTION OF GLAUCOMA FILTRATION DEVICE RIGHT EYE;  Surgeon: Ben Bracken, MD;  Location: [facility] OR;  Service: Ophthalmology;  Laterality: Right;   MITOMYCIN  C APPLICATION Right 07/20/2013   Procedure: MITOMYCIN  C APPLICATION;  Surgeon: Ben Bracken, MD;  Location: [facility] OR;  Service: Ophthalmology;  Laterality: Right;   MITOMYCIN  C APPLICATION Right 02/21/2015   Procedure: MITOMYCIN  C APPLICATION RIGHT EYE;  Surgeon: Ben Bracken, MD;  Location: [facility] OR;  Service: Ophthalmology;  Laterality: Right;   PLACEMENT OF BREAST IMPLANTS Bilateral 1992   "took all my breast tissue out; put implants in;fibrocystic breast disease "   POLYPECTOMY  01/10/2020   Procedure: POLYPECTOMY;  Surgeon: Tami Falcon, MD;  Location: [facility];  Service: Endoscopy;;   RIGHT/LEFT HEART CATH AND CORONARY ANGIOGRAPHY N/A 12/12/2016   Procedure: RIGHT/LEFT HEART CATH AND CORONARY ANGIOGRAPHY;  Surgeon: Sammy Crisp, MD;  Location: [facility];  Service: Cardiovascular;  Laterality: N/A;   TONSILLECTOMY     TOTAL ABDOMINAL HYSTERECTOMY  1982   TRABECULECTOMY Right 02/21/2015   Procedure: TRABECULECTOMY WITH [facility] ON THE RIGHT EYE;  Surgeon: Ben Bracken, MD;  Location: [facility] OR;  Service: Ophthalmology;  Laterality: Right;    OB History     Gravida  2   Para  2   Term      Preterm      AB      Living  2      SAB      IAB      Ectopic      Multiple      Live Births               Home Medications    Prior to Admission medications   Medication Sig Start Date End Date Taking? Authorizing Provider  doxycycline  (VIBRAMYCIN ) 100 MG capsule Take 1 capsule (100 mg total) by mouth 2 (two) times daily for 7 days. 07/21/23 07/28/23 Yes Len Azeez E, PA-C  acetaminophen  (TYLENOL ) 500 MG tablet Take 1,000 mg by mouth 2 (two) times daily as needed for moderate pain or headache.    [provider]  albuterol  (PROVENTIL ) (2.5 MG/3ML) 0.083% nebulizer solution Take 3 mLs (2.5 mg total) by nebulization  daily as needed for wheezing or shortness of breath. 04/01/21   Susanna Epley, FNP  albuterol  (VENTOLIN  HFA) 108 (90 Base) MCG/ACT inhaler Inhale 2 puffs into the lungs every 6 (six) hours as needed for wheezing or shortness of breath. 12/30/19   Rosa College D, MD  ANORO ELLIPTA  62.5-25 MCG/ACT AEPB INHALE 1 PUFF INTO THE LUNGS DAILY AT 6 AM 02/21/22   [name], Praveen, MD  aspirin  EC 81 MG tablet Take 81 mg by mouth daily. Swallow whole.    [provider]  atorvastatin  (LIPITOR) 80 MG tablet Take 1 tablet (80  mg total) by mouth daily. 02/24/23   Walker, Caitlin S, NP  Blood Glucose Monitoring Suppl (ACCU-CHEK AVIVA PLUS) w/Device KIT Use to check blood sugars 3 times a day. Dx code e11.65 01/03/20   Cleave Curling, MD  Blood Glucose Monitoring Suppl (ACCU-CHEK GUIDE) w/Device KIT USE AS NEEDED TO CHECK BLOOD SUGARS. 04/16/23   Cleave Curling, MD  brimonidine  (ALPHAGAN  P) 0.1 % SOLN Place 1 drop into both eyes in the morning, at noon, and at bedtime.    [provider]  brinzolamide  (AZOPT ) 1 % ophthalmic suspension Place 1 drop into both eyes 3 (three) times daily.    [provider]  carvedilol  (COREG ) 12.5 MG tablet Take 1 tablet (12.5 mg total) by mouth 2 (two) times daily with a meal. 06/19/23   Maudine Sos, MD  cetirizine  (ZYRTEC  ALLERGY) 10 MG tablet Take 1 tablet (10 mg total) by mouth daily. Patient not taking: Reported on 06/09/2023 05/06/23 05/05/24  Cleave Curling, MD  EPINEPHrine  0.3 mg/0.3 mL IJ SOAJ injection Inject 0.3 mg into the muscle as needed for anaphylaxis. 05/06/23   Cleave Curling, MD  ezetimibe  (ZETIA ) 10 MG tablet Take 1 tablet (10 mg total) by mouth daily. 01/01/23   [name], Lela Purple, MD  furosemide  (LASIX ) 40 MG tablet Take 1 tablet (40 mg total) by mouth daily as needed. 03/28/22   [name], Lela Purple, MD  ibuprofen  (ADVIL ) 600 MG tablet Take 1 tablet (600 mg total) by mouth every 6 (six) hours as needed. Patient not taking: Reported on 06/09/2023  01/01/23   Lesle Ras, MD  isosorbide  mononitrate (IMDUR ) 60 MG 24 hr tablet TAKE 1 TABLET(60 MG) BY MOUTH DAILY 06/19/23   Maudine Sos, MD  JANUMET  50-500 MG tablet TAKE 1 TABLET BY MOUTH TWICE DAILY WITH A MEAL 06/09/23   Cleave Curling, MD  ketorolac  (ACULAR ) 0.5 % ophthalmic solution Place 1 drop into the right eye 2 (two) times daily. 08/02/20   [provider]  Lancets (ONETOUCH DELICA PLUS LANCET33G) MISC CHECK BLOOD SUGAR BEFORE BREAKFAST AND DINNER 09/12/20   Ghumman, Ramandeep, NP  loperamide  (IMODIUM ) 2 MG capsule Take 1 capsule (2 mg total) by mouth 4 (four) times daily as needed for diarrhea or loose stools. Patient not taking: Reported on 05/06/2023 11/07/22   Edson Graces, MD  montelukast  (SINGULAIR ) 10 MG tablet TAKE 1 TABLET BY MOUTH EVERY DAY 02/17/22   Cleave Curling, MD  Nebulizers (COMPRESSOR/NEBULIZER) MISC Use as directed Patient not taking: Reported on 05/06/2023 04/22/18   Faustina Hood, MD  nitroGLYCERIN  (NITROSTAT ) 0.4 MG SL tablet Place 1 tablet (0.4 mg total) under the tongue every 5 (five) minutes as needed for chest pain. 07/25/19 09/21/23  Cleave Curling, MD  ondansetron  (ZOFRAN -ODT) 4 MG disintegrating tablet Take 1 tablet (4 mg total) by mouth every 8 (eight) hours as needed for nausea or vomiting. 12/06/22   Rai, Hurman Maiden, MD  pantoprazole  (PROTONIX ) 40 MG tablet Take 1 tablet (40 mg total) by mouth daily. 06/19/23   Maudine Sos, MD  Polyvinyl Alcohol -Povidone PF (REFRESH) 1.4-0.6 % SOLN Place 1-2 drops into both eyes 3 (three) times daily as needed (dry/irritated eyes.).    [provider]  prednisoLONE acetate (PRED FORTE) 1 % ophthalmic suspension Place 1 drop into both eyes in the morning, at noon, and at bedtime. Place 2 Drops in Left Eye, Place 3 Drops In Right Eye TID 10/11/20   [provider]  pregabalin  (LYRICA ) 75 MG capsule TAKE 1 CAPSULE(75 MG) BY MOUTH  TWICE DAILY 02/12/23   Cleave Curling, MD  RHOPRESSA  0.02 % SOLN Place  1 drop into the left eye at bedtime. 05/27/21   [provider]  tiZANidine  (ZANAFLEX ) 4 MG tablet Take 0.5 tablets (2 mg total) by mouth every 6 (six) hours as needed for muscle spasms. Patient not taking: Reported on 05/06/2023 01/01/23   Lesle Ras, MD    Family History Family History  Problem Relation Age of Onset   Heart disease Father    Diabetes Father    Glaucoma Father    Cancer Mother        unknown type, ?lung   Heart disease Paternal Grandmother    Cancer Paternal Grandmother        unknown type   Diabetes Brother    Glaucoma Brother     Social History Social History   Tobacco Use   Smoking status: Former    Current packs/day: 0.00    Average packs/day: 1 pack/day for 4.0 years (4.0 ttl pk-yrs)    Types: Cigarettes    Start date: 02/11/1976    Quit date: 02/11/1980    Years since quitting: 43.4   Smokeless tobacco: Never  Vaping Use   Vaping status: Never Used  Substance Use Topics   Alcohol  use: No   Drug use: No     Allergies   Crestor [rosuvastatin calcium ], Demerol  [meperidine hcl], and Shellfish allergy   Review of Systems Review of Systems  Constitutional:  Positive for chills and fever.  HENT:  Positive for congestion, rhinorrhea and sore throat. Negative for ear pain.   Respiratory:  Positive for cough and shortness of breath.   Gastrointestinal:  Positive for diarrhea. Negative for nausea and vomiting.  Neurological:  Positive for headaches.     Physical Exam Triage Vital Signs ED Triage Vitals  Encounter Vitals Group     BP 07/21/23 1404 (!) 195/90     Systolic BP Percentile --      Diastolic BP Percentile --      Pulse Rate 07/21/23 1404 69     Resp 07/21/23 1404 17     Temp 07/21/23 1404 (!) 97.5 F (36.4 C)     Temp Source 07/21/23 1404 Oral     SpO2 07/21/23 1404 91 %     Weight 07/21/23 1417 160 lb (72.6 kg)     Height 07/21/23 1417 5\' 2"  (1.575 m)     Head Circumference --      Peak Flow --      Pain Score  07/21/23 1417 6     Pain Loc --      Pain Education --      Exclude from Growth Chart --    No data found.  Updated Vital Signs BP (!) 182/84 (BP Location: Right Arm)   Pulse 67   Temp 98.4 F (36.9 C) (Oral)   Resp 17   Ht 5\' 2"  (1.575 m)   Wt 160 lb (72.6 kg)   SpO2 97%   BMI 29.26 kg/m   Visual Acuity Right Eye Distance:   Left Eye Distance:   Bilateral Distance:    Right Eye Near:   Left Eye Near:    Bilateral Near:     Physical Exam Vitals reviewed.  Constitutional:      General: She is awake. She is not in acute distress.    Appearance: Normal appearance. She is well-developed and well-groomed. She is not ill-appearing or toxic-appearing.     Comments: Pt  is a pleasant 78 yo female who appears stated age. She does not appear ill or seem to be in acute distress.   HENT:     Head: Normocephalic and atraumatic.     Right Ear: Hearing, tympanic membrane and ear canal normal.     Left Ear: Hearing, tympanic membrane and ear canal normal.     Mouth/Throat:     Lips: Pink.     Mouth: Mucous membranes are moist.     Pharynx: Oropharynx is clear. Uvula midline. No pharyngeal swelling, oropharyngeal exudate, posterior oropharyngeal erythema, uvula swelling or postnasal drip.  Cardiovascular:     Rate and Rhythm: Normal rate and regular rhythm.     Pulses: Normal pulses.          Radial pulses are 2+ on the right side and 2+ on the left side.     Heart sounds: Normal heart sounds. No murmur heard.    No friction rub. No gallop.  Pulmonary:     Effort: Pulmonary effort is normal.     Breath sounds: Decreased air movement present. Examination of the right-middle field reveals decreased breath sounds. Examination of the right-lower field reveals decreased breath sounds. Examination of the left-lower field reveals decreased breath sounds. Decreased breath sounds present. No wheezing, rhonchi or rales.  Musculoskeletal:     Cervical back: Normal range of motion and neck  supple.  Lymphadenopathy:     Head:     Right side of head: No submental, submandibular or preauricular adenopathy.     Left side of head: No submental, submandibular or preauricular adenopathy.     Cervical:     Right cervical: No superficial cervical adenopathy.    Left cervical: No superficial cervical adenopathy.     Upper Body:     Right upper body: No supraclavicular adenopathy.     Left upper body: No supraclavicular adenopathy.  Skin:    General: Skin is warm and dry.  Neurological:     General: No focal deficit present.     Mental Status: She is alert and oriented to person, place, and time.  Psychiatric:        Mood and Affect: Mood normal.        Behavior: Behavior normal. Behavior is cooperative.      UC Treatments / Results  Labs (all labs ordered are listed, but only abnormal results are displayed) Labs Reviewed - No data to display  EKG   Radiology DG Chest 2 View Result Date: 07/21/2023 CLINICAL DATA:  Productive cough. EXAM: CHEST - 2 VIEW COMPARISON:  December 07, 2022. FINDINGS: The heart size and mediastinal contours are within normal limits. Both lungs are clear. The visualized skeletal structures are unremarkable. IMPRESSION: No active cardiopulmonary disease. Electronically Signed   By: Rosalene Colon M.D.   On: 07/21/2023 15:25    Procedures Procedures (including critical care time)  Medications Ordered in UC Medications - No data to display  Initial Impression / Assessment and Plan / UC Course  I have reviewed the triage vital signs and the nursing notes.  Pertinent labs & imaging results that were available during my care of the patient were reviewed by me and considered in my medical decision making (see chart for details).      Final Clinical Impressions(s) / UC Diagnoses   Final diagnoses:  Decreased breath sounds of both lungs  Acute pansinusitis, recurrence not specified   Patient presents today with concerns for persistent nasal  congestion, productive cough  as well as elevated blood pressure and headache.  She was previously seen at this urgent care for 07/06/2023 and diagnosed with sinusitis and treated with.  She has completed the entire course but reports minimal improvement in symptoms.  Physical exam today reveals mildly decreased breath sounds as well as elevated blood pressure on vitals.  Chest x-ray was negative for signs of acute cardiopulmonary disease per radiology review.  Given lack of improvement in symptoms will send in doxycycline  100 mg p.o. twice daily x 7 days and recommend over-the-counter medications as needed for further symptomatic relief.  At this time I suspect her elevated blood pressure is likely secondary to ongoing sinus infection and discomfort.  Reviewed with patient that she should continue monitoring blood pressure in the next few days and follow-up with PCP/cardiology for continued elevations.  ED and return precautions were also reviewed and provided in after visit summary.  Follow-up as needed    Discharge Instructions      Your chest x-ray was negative for signs of pneumonia. Based on your symptoms and duration of illness, I believe you may have a bacterial sinus infection  These typically resolve with antibiotic therapy along with at-home comfort measures  Today I have sent in a prescription for  Doxycycline  100 mg to be taken by mouth twice per day for 7 days.  FINISH THE ENTIRE COURSE unless you develop an allergic reaction or are instructed to discontinue.  It can take a few days for the antibiotic to kick in so I recommend symptomatic relief with over the counter medication such as the following If you have high blood pressure I recommend that you take Mucinex , Robitussin, Tylenol  instead of the combination medications.  Combination medications typically have a decongestant in them that can cause your blood pressure to be high.  These medications typically have Tylenol  in them already  so you do not need to supplement with more outside of those medications  Stay well hydrated with at least 75 oz of water per day to help with recovery  If at any point you start to develop swelling around the eyes and nose, trouble seeing, fevers that are not responding to medications, trouble breathing, passing out or headaches that are very severe please go to the emergency room for further evaluation management  You were also seen for concerns of elevated blood pressure.  During your time here your blood pressure did decrease but it is still high.  I recommend following up with your PCP and your cardiologist for further evaluation and ongoing management.  If your blood pressure stays elevated and you start to develop headache, dizziness, vision changes, confusion, loss of consciousness, chest pain please go to the emergency room as these could be sides of medical emergency.   ED Prescriptions     Medication Sig Dispense Auth. Provider   doxycycline  (VIBRAMYCIN ) 100 MG capsule Take 1 capsule (100 mg total) by mouth 2 (two) times daily for 7 days. 14 capsule Maron Stanzione E, PA-C      PDMP not reviewed this encounter.   Yacine Droz E, PA-C 07/21/23 2108

## 2023-07-21 NOTE — Telephone Encounter (Signed)
 Patient walked in from PT downstairs, blood pressure there was 180/110 On recheck her blood pressure was 168/84 & 178/82 She has been having lot of congestion and productive cough with colored sputum  Stated felt like her head was full of cotton  Having weakness and feeling sluggish  Was given Augmentin  for 7 days on 5/26 for "sinus infection", completed Stated she is not feeling better, feeling about the same  Taking Mucinex  twice a day, unsure if plain or has letters   She has been experiencing severe cramping in her hands, feet, and legs  Yesterday was off and on all day   Blood pressure 178/82 right arm, 168/84 left arm and temp 98.3  Discussed with Dr Theodis Fiscal who recommended seeing PCP or going back to urgent care Will have patient monitor blood pressure at home for the next couple of weeks, call back if no improvement  Reviewed recommendations with patient who is agreeable with plan  Advised if no lab work ordered to call the office to get updated BMET since patient does use Lasix  prn and has not been taking potassium

## 2023-07-21 NOTE — Telephone Encounter (Signed)
 FYI Only or Action Required?: FYI only for provider  Patient was last seen in primary care on 04/15/2023 by Cleave Curling, MD. Called Nurse Triage reporting Hypertension and Sinusitis. Symptoms began several months ago. Interventions attempted: OTC medications: Tylenol , Muccinex, Robitussin cough syrup and Prescription medications: carvedilol . Symptoms are: all over body cramps, cough, nasal congestion, sinus pain (around eyes), elevated BP unchanged.  Triage Disposition: See Physician Within 24 Hours- refused appts tomorrow, going to urgent care  Patient/caregiver understands and will follow disposition?: Yes                      Copied from CRM [id number]. Topic: Clinical - Red Word Triage >> Jul 21, 2023 12:02 PM Sheila Reynolds wrote: Red Word that prompted transfer to Nurse Triage: Blood pressure was 180/110 during physical therapy today and experienced body cramps. Patient has a sinus infection per heart doctor. Callback is [phone] Reason for Disposition  Systolic BP  >= 180 OR Diastolic >= 110  Answer Assessment - Initial Assessment Questions 1. BLOOD PRESSURE: "What is the blood pressure?" "Did you take at least two measurements 5 minutes apart?"     180/110.  2. ONSET: "When did you take your blood pressure?"     Today around 10:30am during physical therapy, they cancelled due to her BP.  3. HOW: "How did you take your blood pressure?" (e.g., automatic home BP monitor, visiting nurse)     Automatic BP monitor.  4. HISTORY: "Do you have a history of high blood pressure?"     Yes.  5. MEDICINES: "Are you taking any medicines for blood pressure?" "Have you missed any doses recently?"     Carvedilol , no missed doses.  6. OTHER SYMPTOMS: "Do you have any symptoms?" (e.g., blurred vision, chest pain, difficulty breathing, headache, weakness)     All over body cramps (x several months), cough, nasal congestion, sinus pain and around eyes, no recent fevers. Denies chest  pain, SOB, unilateral numbness or weakness, changes in speech or vision.  7. PREGNANCY: "Is there any chance you are pregnant?" "When was your last menstrual period?"     N/A.  Protocols used: Blood Pressure - High-A-AH

## 2023-07-21 NOTE — Therapy (Addendum)
 " OUTPATIENT PHYSICAL THERAPY LOWER EXTREMITY TREATMENT  PHYSICAL THERAPY DISCHARGE SUMMARY  Visits from Start of Care: 7  Plan: Patient agrees to discharge.  Patient goals were not met. Patient is being discharged due to not returning to therapy.       Patient Name: Sheila Reynolds MRN: [id number] DOB:1945-11-15, 78 y.o., female Today's Date: 07/21/2023  END OF SESSION:  PT End of Session - 07/21/23 1038     Visit Number 7    Number of Visits 18    Date for PT Re-Evaluation 08/30/23    Authorization Type MCR    PT Start Time 1024    PT Stop Time 1040    PT Time Calculation (min) 16 min    Equipment Utilized During Treatment Gait belt    Activity Tolerance Patient tolerated treatment well    Behavior During Therapy WFL for tasks assessed/performed                 Past Medical History:  Diagnosis Date   Anemia    3 months ago anemic   Anxiety    on meds   Arthritis    all over (01/15/2017)   Asthma    Bronchitis with emphysema    Chest pain    Chronic bronchitis (HCC)    Coronary artery disease    a. 01/2017 she underwent orbital atherectomy/DES to the proxmal LAD and PTCA to ostial D2. 2D Echo 01/15/17 showed mild LVH, EF 60-65%, grade 1 DD.   Family history of anesthesia complication    daughter N/V   Fibromyalgia    GERD (gastroesophageal reflux disease)    on meds   Glaucoma    Heart murmur    History of hiatal hernia    Hx of echocardiogram    Echo (03/2013):  Tech limited; Mild focal basal septal hypertrophy, EF 60-65%, normal RVF   Hyperlipidemia    Hypertension    Nonsustained ventricular tachycardia (HCC)    OSA on CPAP    Premature atrial contractions    PVC (premature ventricular contraction)    a. Holter 12/16: NSR, occ PAC,PVCs   Sarcoidosis    Type II diabetes mellitus (HCC)    Past Surgical History:  Procedure Laterality Date   BREAST SURGERY     CARDIAC CATHETERIZATION  05/04/2007   reveals overall normal left ventricular  systolic function. Ejection fraction 65-70%   CATARACT EXTRACTION W/PHACO Right 07/20/2013   Procedure: CATARACT EXTRACTION PHACO AND INTRAOCULAR LENS PLACEMENT (IOC);  Surgeon: Gaither Quan, MD;  Location: [facility] OR;  Service: Ophthalmology;  Laterality: Right;   COLONOSCOPY W/ BIOPSIES AND POLYPECTOMY     COLONOSCOPY WITH PROPOFOL  N/A 09/07/2014   Procedure: COLONOSCOPY WITH PROPOFOL ;  Surgeon: Renaye Sous, MD;  Location: [facility];  Service: Endoscopy;  Laterality: N/A;   COLONOSCOPY WITH PROPOFOL  N/A 01/10/2020   Procedure: COLONOSCOPY WITH PROPOFOL ;  Surgeon: Sous Renaye, MD;  Location: [facility];  Service: Endoscopy;  Laterality: N/A;   CORONARY ANGIOPLASTY WITH STENT PLACEMENT  01/15/2017   CORONARY ATHERECTOMY N/A 01/15/2017   Procedure: CORONARY ATHERECTOMY;  Surgeon: Mady Bruckner, MD;  Location: [facility];  Service: Cardiovascular;  Laterality: N/A;   CORONARY BALLOON ANGIOPLASTY N/A 01/15/2017   Procedure: CORONARY BALLOON ANGIOPLASTY;  Surgeon: Mady Bruckner, MD;  Location: [facility];  Service: Cardiovascular;  Laterality: N/A;   CORONARY PRESSURE/FFR STUDY N/A 12/12/2016   Procedure: INTRAVASCULAR PRESSURE WIRE/FFR STUDY;  Surgeon: Mady Bruckner, MD;  Location: [facility];  Service: Cardiovascular;  Laterality: N/A;   CORONARY STENT INTERVENTION N/A 01/15/2017   Procedure: CORONARY STENT INTERVENTION;  Surgeon: Mady Bruckner, MD;  Location: [facility];  Service: Cardiovascular;  Laterality: N/A;   ESOPHAGOGASTRODUODENOSCOPY (EGD) WITH PROPOFOL  N/A 09/07/2014   Procedure: ESOPHAGOGASTRODUODENOSCOPY (EGD) WITH PROPOFOL ;  Surgeon: Renaye Sous, MD;  Location: [facility];  Service: Endoscopy;  Laterality: N/A;   EXTERNAL EAR SURGERY Bilateral 1970s   tumors removed   EYE SURGERY Left 2019   cataract extraction    EYE SURGERY Right 02/09/2018   eyelid surgery    MINI SHUNT INSERTION Right 07/20/2013   Procedure: INSERTION OF GLAUCOMA  FILTRATION DEVICE RIGHT EYE;  Surgeon: Gaither Quan, MD;  Location: [facility] OR;  Service: Ophthalmology;  Laterality: Right;   MITOMYCIN  C APPLICATION Right 07/20/2013   Procedure: MITOMYCIN  C APPLICATION;  Surgeon: Gaither Quan, MD;  Location: [facility] OR;  Service: Ophthalmology;  Laterality: Right;   MITOMYCIN  C APPLICATION Right 02/21/2015   Procedure: MITOMYCIN  C APPLICATION RIGHT EYE;  Surgeon: Gaither Quan, MD;  Location: [facility] OR;  Service: Ophthalmology;  Laterality: Right;   PLACEMENT OF BREAST IMPLANTS Bilateral 1992   took all my breast tissue out; put implants in;fibrocystic breast disease    POLYPECTOMY  01/10/2020   Procedure: POLYPECTOMY;  Surgeon: Sous Renaye, MD;  Location: [facility];  Service: Endoscopy;;   RIGHT/LEFT HEART CATH AND CORONARY ANGIOGRAPHY N/A 12/12/2016   Procedure: RIGHT/LEFT HEART CATH AND CORONARY ANGIOGRAPHY;  Surgeon: Mady Bruckner, MD;  Location: [facility];  Service: Cardiovascular;  Laterality: N/A;   TONSILLECTOMY     TOTAL ABDOMINAL HYSTERECTOMY  1982   TRABECULECTOMY Right 02/21/2015   Procedure: TRABECULECTOMY WITH [facility] ON THE RIGHT EYE;  Surgeon: Gaither Quan, MD;  Location: [facility] OR;  Service: Ophthalmology;  Laterality: Right;   Patient Active Problem List   Diagnosis Date Noted   Interstitial lung disease (HCC) 06/09/2023   Myalgia 04/28/2023   Near syncope 04/15/2023   Viral gastroenteritis 04/15/2023   Postconcussion syndrome 12/16/2022   Syncope 12/03/2022   Acute lower UTI 12/03/2022   Cavitating mass in right upper lung lobe 12/03/2022   Dyslipidemia 12/03/2022   Coronary artery disease 12/03/2022   Chronic obstructive pulmonary disease (COPD) (HCC) 12/03/2022   Cavitary pneumonia 12/03/2022   Atherosclerosis of aorta (HCC) 10/05/2022   Left leg pain 10/05/2022   Pain of toe of left foot 10/05/2022   Current moderate episode of major depressive disorder, unspecified whether recurrent (HCC) 09/22/2022   Abdominal hernia 05/15/2022    Candidal vulvovaginitis 05/15/2022   Fracture of foot 05/15/2022   Hearing loss 05/15/2022   Hypertensive heart disease without heart failure 05/15/2022   Menopausal syndrome 05/15/2022   Other long term (current) drug therapy 05/15/2022   Postherpetic trigeminal neuralgia 05/15/2022   Swollen abdomen 05/15/2022   Type 2 diabetes mellitus with hyperglycemia (HCC) 05/15/2022   Urinary incontinence 05/15/2022   Partial symptomatic epilepsy with complex partial seizures, not intractable, without status epilepticus (HCC) 05/15/2022   Diabetic polyneuropathy associated with type 2 diabetes mellitus (HCC) 12/15/2021   Oxygen  dependent 12/15/2021   History of fall 12/15/2021   Chronic arthritis 12/15/2021   Dyslipidemia associated with type 2 diabetes mellitus (HCC) 11/05/2021   Generalized weakness 11/05/2021   Pneumonitis 11/05/2021   Postnasal drip 10/18/2021   Low hemoglobin 10/18/2021   Positive ANA (antinuclear antibody) 10/18/2021   BMI 29.0-29.9,adult 10/18/2021   IPF (idiopathic pulmonary fibrosis) (HCC) 08/20/2021   Urinary frequency 06/19/2021   Urinary  tract infection without hematuria 06/19/2021   Imbalance 06/19/2021   Memory changes 06/19/2021   Drug therapy 06/19/2021   Back pain 05/24/2020   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 heart failure (HCC) 04/16/2020   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) (HCC) 04/13/2020   Chronic diastolic heart failure (HCC) 03/28/2020   Uncontrolled type 2 diabetes mellitus with hyperglycemia (HCC) 07/25/2019   Hiatal hernia 07/25/2019   Spontaneous ecchymoses 07/25/2019   Class 1 obesity due to excess calories with serious comorbidity and body mass index (BMI) of 30.0 to 30.9 in adult 07/25/2019   Peripheral vascular disease, unspecified (HCC) 03/23/2019   Uveitis 06/01/2017   Fibromyalgia 06/01/2017   DDD (degenerative disc disease), lumbar 06/01/2017   DDD (degenerative disc  disease), thoracic 06/01/2017   History of hypercholesterolemia 06/01/2017   History of anxiety 06/01/2017   History of IBS 06/01/2017   Premature atrial contractions 03/30/2017   PVC's (premature ventricular contractions) 03/30/2017   Diabetes mellitus without complication (HCC) 01/28/2017   Angina pectoris (HCC) 01/15/2017   CAD (coronary artery disease) 01/12/2017   Shortness of breath 12/12/2016   Nuclear sclerotic cataract of left eye 11/16/2015   Status post eye surgery 11/16/2015   Primary open angle glaucoma of left eye, moderate stage 10/31/2015   Chronic iritis of both eyes 08/22/2015   Cystoid macular edema of right eye 08/22/2015   Acute maxillary sinusitis 05/03/2014   Obstructive sleep apnea 11/04/2013   Multiple lung nodules 07/18/2012   Heart palpitations 03/11/2012   HTN (hypertension) 03/11/2012   Unspecified glaucoma 08/29/2011   Gastroesophageal reflux disease without esophagitis 07/13/2011   Sarcoidosis 02/08/2011   COPD with acute exacerbation (HCC) 02/05/2011   Hyperlipemia 09/24/2010   Atypical chest pain 09/24/2010   Leg edema 09/24/2010    PCP: [name], MD   REFERRING PROVIDER:  Raford Riggs, MD       REFERRING DIAG:   R53.1 (ICD-10-CM) - Weakness  R26.89 (ICD-10-CM) - Balance problem    THERAPY DIAG:  Other abnormalities of gait and mobility  Unsteadiness on feet  Difficulty walking  Muscle weakness (generalized)  Rationale for Evaluation and Treatment: Rehabilitation  ONSET DATE: 2024  SUBJECTIVE:   SUBJECTIVE STATEMENT: goes by Vee  Pt  reports the knee hurts more after last session. Pt has been dealing with full body cramping all night and increased knee pain. Pt has not taken her blood pressure today. She is denies feeling dizziness or lightheaded. Pt has been drinking Gatorade for hydration.   Eval:   Pt reports she was doing therapy about a year ago due to falls and balance issues. She has issues with  bending down due to falling. Pt feels stiffness in the legs that limits ability to lift feet up. Pt reports history of blacking out and passing out. She has not had an episode of 6 months. Pt feels lightheaded and something is wrong before she passes out. She  does feel this consisently each of the 3x times she passes out. Has a history of 4 mini-strokes. Pt feels she looses her balance a couple times day. She feels like she tips forward. Pt does have bilat hip and knee pain. Pt feels that back pain will cause her to chest pain, happens with long term standing. Pt does have neuropathy in both feet but is controlled. Pt does endorse catching her feet on the ground and difficulty with uneven surfaces. Pt denies lightheadedness or room spinning sensations.   PERTINENT HISTORY: Sarcoidosis,  headaches, NSVT, HLD, leg edema, HTN, OSA, CAD, angina, DM2, PACs, PVCs, Fibromyalgia, PVD, chronic diastolic heart failure, recent diagnosis of pulmonary fibrosis  PAIN:  Are you having pain? Yes: NPRS scale: 4/10 Pain location: chest, back, bilat knees, and hips, and hands  Pain description: aching, sharp Aggravating factors: movement, standing, leaning fwd  Relieving factors: meds   PRECAUTIONS: Falls  RED FLAGS: None   WEIGHT BEARING RESTRICTIONS: No  FALLS:  Has patient fallen in last 6 months? No, lots of near stumbles  LIVING ENVIRONMENT: Lives with: lives with their spouse Lives in: House/apartment Stairs: Yes  Has following equipment at home: [facility]  OCCUPATION: N/A  PLOF: Independent with basic ADLs and Independent with household mobility with device  PATIENT GOALS: improve her balance and walking, prevent falls; play her great grandson, be able to drive   OBJECTIVE:  Note: Objective measures were completed at Evaluation unless otherwise noted.  DIAGNOSTIC FINDINGS: N/A   BP seated R arm:  119/79  77 HR  FUNCTIONAL OUTCOME MEASURE:   Total ABC score: 500 / 1600 = 31.3 %    Berg  Balance Scale = 28  Interpretation: This score is associated with almost 100% fall risk while the patient at the moment may be requiring assistance in performing certain activities of daily living such as walking.    LE  ROM:  LE WFL for tasks assessed; limited and stiff in bilateral hip and knee flexion and ext   LOWER EXTREMITY MMT:   MMT Right eval Left eval  Hip flexion 4/5 4/5  Hip extension      Hip abduction 4/5 4/5  Hip adduction      Hip internal rotation      Hip external rotation      Knee flexion      Knee extension 4/5 4/5  Ankle dorsiflexion      Ankle plantarflexion      Ankle inversion      Ankle eversion       (Blank rows = not tested)     GAIT: shortened step length; shuffling gait, fwd flexed posture (pt reports leaving cane at home)     STS: Requires UE for stand and sit  Unable to perform 5XSTS   TREATMENT  6/10  Seated BP 179/110 (L UE)   No acute distress noted, no diaphoresis safety concerns with current BP, contact of MD for BP concerns and orthopedic same day injury clinic for L knee pain if symptoms worsen Hydration and pain management, emergency vs non-emergency symptoms  6/9  Ligamentous exam intact; TTP along medial HS and quad  Knee ext 2x10 3#  Knee flexion iso 3s 10x Tailgate stretch 5s 10x Seated HS stretch 30s 3x Standing TKE 2x10 Standing HR 2x10  6/3  Nustep warm up 5 min lvl 5 Standing HR 2x10  Airex SLS with toe touch 30s each Ariex march 2x20 with light CGA Romberg stance on airec 2x30sec Romerg stance on airex with head turns  Step ups onto airex pad 2x10 Side stepping along rail x2laps (min UE support) backwards walking along rail x1 lap (SBA) Sit to stands from elevated plinth 2x10 LAQ 3# x10ea  5/19  Nustep warm up 6 min lvl 3 Standing HR   Airex SLS with toe touch 30s each Ariex march 2x20 Reactive stepping with gait belt perturbations static and walking (all directions)  3x rounds; 2x laps    Edu of balance mechanics and reactive stepping mechanics  5/12  Nustep warm  up 5 min lvl 1  NBOS  airex EC 30s 4x NBOS with reach 3x5 in all directions for head hips Step taps 2x20 4  4 step up 2x10    4/28 Nustep warm up 5 min lvl 1  STS 3x6 Transfer mechanics safety Sidestep at table 4x  Retro step 4x at table NBOS  airex EC 30s 4x Fwd step up on airex with focus on knee flexion and foot clearance 2x10 ea  HEP updates and modifications; posture effects on balance and COG over BOS   4/21  Exercises - Sit to Stand with Counter Support  - 1 x daily - 7 x weekly - 3 sets - 10 reps - Standing Heel Raises  - 1 x daily - 7 x weekly - 3 sets - 10 reps - Mini Squat with Counter Support  - 1 x daily - 7 x weekly - 3 sets - 10 reps       PATIENT EDUCATION:  Education details: diagnosis, prognosis, anatomy, exercise progression, DOMS expectations, muscle firing,  envelope of function, HEP, POC   Person educated: Patient Education method: Explanation, demonstration Education comprehension: verbalized understanding, returned demonstration   HOME EXERCISE PROGRAM:  Access Code: [email or web address] URL: [email or web address] Date: 06/01/2023 Prepared by: Dale Call   ASSESSMENT:   CLINICAL IMPRESSION:  No exercise today in session due to unsafe BP at rest. Pt attempted to call MD office in clinic with PT and therapist advised pt on safety with current BP and need for medical attention vs emergency medical visit. Pt verbalized understanding. Associated knee pain management and resources for referral to same day injury clinic provided. Self care information provided as noted above. Exercise therapy in session deferred due to unsafe vitals. Pt gave verbal understanding to all instructions for further medical management.       OBJECTIVE IMPAIRMENTS: Abnormal gait, cardiopulmonary status limiting activity, decreased activity tolerance, decreased balance, decreased  endurance, decreased mobility, difficulty walking, decreased ROM, decreased strength, hypomobility, impaired flexibility, improper body mechanics, postural dysfunction, and pain.    ACTIVITY LIMITATIONS: carrying, lifting, bending, squatting, stairs, transfers, and locomotion level   PARTICIPATION LIMITATIONS: cleaning, laundry, and community activity   PERSONAL FACTORS: Age, Behavior pattern, Fitness, Time since onset of injury/illness/exacerbation, and 1-2 comorbidities:    are also affecting patient's functional outcome.    REHAB POTENTIAL: Fair     CLINICAL DECISION MAKING: Evolving/moderate complexity   EVALUATION COMPLEXITY: Moderate     GOALS:   SHORT TERM GOALS: Target date:  07/13/2023    Pt will become independent with HEP in order to demonstrate synthesis of PT education.   Baseline: Goal status: INITIAL     2.  Pt will report at least 2 pt reduction on NPRS scale for pain in order to demonstrate functional improvement with household activity, self care, and ADL.   Baseline:  Goal status: INITIAL       LONG TERM GOALS: Target date: POC Date   Pt  will become independent with final HEP in order to demonstrate synthesis of PT education.  Baseline:  Goal status: INITIAL   2.  Pt will be able to demonstrate/report ability to walk >10 mins without pain in order to demonstrate functional improvement and tolerance to exercise and community mobility.    Goal status: INITIAL   3. Pt will decrease 5XSTS by at least 3 seconds in order to demonstrate clinically significant improvement in LE strength   Baseline:  Goal status: INITIAL   4.  Pt will  have an at least 45/56 on Berg Balance scale in order to demonstrate improvement above cut off score for risk of falls.     Baseline:  Goal status: INITIAL  5. Pt will be able to demonstrate at least 67% confidence on ABC scale in order to demonstrate meaningful improvement in balance confidence above cut off score for older  adults.  Baseline:  Goal status: INITIAL       PLAN:   PT FREQUENCY: 1-2x/week   PT DURATION: 12 wks (plan to DC in 8-10 with HEP)    PLANNED INTERVENTIONS: Therapeutic exercises, Therapeutic activity, Neuromuscular re-education, Balance training, Gait training, Patient/Family education, Self Care, Joint mobilization, Stair training, DME instructions, Cryotherapy, Moist heat, Taping, Manual therapy, and Re-evaluation   PLAN FOR NEXT SESSION: dynamic gait/balance, lumbar/hip strength and ROM     Dale Call, PT 07/21/2023, 10:40 AM  "

## 2023-07-21 NOTE — ED Triage Notes (Signed)
 Pt presents with complaints of high blood pressure readings this morning at physical therapy. Currently has a headache, rates a 6/10. Pt is also reporting muscle cramps all over. States the muscle cramps are normal for her but the headache is not.   Pt also states she still has a sinus infection. Was seen here previously on 5/26, took full dose of antibiotics. Symptoms are still present (nasal congestion, cough, and headaches).

## 2023-07-21 NOTE — Discharge Instructions (Addendum)
 Your chest x-ray was negative for signs of pneumonia. Based on your symptoms and duration of illness, I believe you may have a bacterial sinus infection  These typically resolve with antibiotic therapy along with at-home comfort measures  Today I have sent in a prescription for  Doxycycline  100 mg to be taken by mouth twice per day for 7 days.  FINISH THE ENTIRE COURSE unless you develop an allergic reaction or are instructed to discontinue.  It can take a few days for the antibiotic to kick in so I recommend symptomatic relief with over the counter medication such as the following If you have high blood pressure I recommend that you take Mucinex , Robitussin, Tylenol  instead of the combination medications.  Combination medications typically have a decongestant in them that can cause your blood pressure to be high.  These medications typically have Tylenol  in them already so you do not need to supplement with more outside of those medications  Stay well hydrated with at least 75 oz of water per day to help with recovery  If at any point you start to develop swelling around the eyes and nose, trouble seeing, fevers that are not responding to medications, trouble breathing, passing out or headaches that are very severe please go to the emergency room for further evaluation management  You were also seen for concerns of elevated blood pressure.  During your time here your blood pressure did decrease but it is still high.  I recommend following up with your PCP and your cardiologist for further evaluation and ongoing management.  If your blood pressure stays elevated and you start to develop headache, dizziness, vision changes, confusion, loss of consciousness, chest pain please go to the emergency room as these could be sides of medical emergency.

## 2023-07-31 ENCOUNTER — Ambulatory Visit: Admitting: Internal Medicine

## 2023-07-31 ENCOUNTER — Encounter: Payer: Self-pay | Admitting: Internal Medicine

## 2023-07-31 VITALS — BP 150/82 | HR 77 | Temp 98.2°F | Ht 62.0 in | Wt 167.0 lb

## 2023-07-31 DIAGNOSIS — J019 Acute sinusitis, unspecified: Secondary | ICD-10-CM | POA: Diagnosis not present

## 2023-07-31 DIAGNOSIS — D869 Sarcoidosis, unspecified: Secondary | ICD-10-CM | POA: Diagnosis not present

## 2023-07-31 DIAGNOSIS — G4733 Obstructive sleep apnea (adult) (pediatric): Secondary | ICD-10-CM

## 2023-07-31 DIAGNOSIS — Z87891 Personal history of nicotine dependence: Secondary | ICD-10-CM

## 2023-07-31 DIAGNOSIS — J849 Interstitial pulmonary disease, unspecified: Secondary | ICD-10-CM

## 2023-07-31 DIAGNOSIS — R918 Other nonspecific abnormal finding of lung field: Secondary | ICD-10-CM | POA: Diagnosis not present

## 2023-07-31 LAB — CBC WITH DIFFERENTIAL/PLATELET
Basophils Absolute: 0 10*3/uL (ref 0.0–0.1)
Basophils Relative: 0.8 % (ref 0.0–3.0)
Eosinophils Absolute: 0.3 10*3/uL (ref 0.0–0.7)
Eosinophils Relative: 4.7 % (ref 0.0–5.0)
HCT: 36.2 % (ref 36.0–46.0)
Hemoglobin: 12.4 g/dL (ref 12.0–15.0)
Lymphocytes Relative: 20.5 % (ref 12.0–46.0)
Lymphs Abs: 1.2 10*3/uL (ref 0.7–4.0)
MCHC: 34.1 g/dL (ref 30.0–36.0)
MCV: 83.4 fl (ref 78.0–100.0)
Monocytes Absolute: 0.9 10*3/uL (ref 0.1–1.0)
Monocytes Relative: 15.7 % — ABNORMAL HIGH (ref 3.0–12.0)
Neutro Abs: 3.4 10*3/uL (ref 1.4–7.7)
Neutrophils Relative %: 58.3 % (ref 43.0–77.0)
Platelets: 202 10*3/uL (ref 150.0–400.0)
RBC: 4.34 Mil/uL (ref 3.87–5.11)
RDW: 14.6 % (ref 11.5–15.5)
WBC: 5.9 10*3/uL (ref 4.0–10.5)

## 2023-07-31 LAB — BASIC METABOLIC PANEL WITH GFR
BUN: 13 mg/dL (ref 6–23)
CO2: 28 meq/L (ref 19–32)
Calcium: 9.7 mg/dL (ref 8.4–10.5)
Chloride: 103 meq/L (ref 96–112)
Creatinine, Ser: 0.85 mg/dL (ref 0.40–1.20)
GFR: 66.72 mL/min (ref >60.00)
Glucose, Bld: 98 mg/dL (ref 70–99)
Potassium: 4.2 meq/L (ref 3.5–5.1)
Sodium: 138 meq/L (ref 135–145)

## 2023-07-31 MED ORDER — PREDNISONE 10 MG PO TABS: ORAL_TABLET | ORAL | 0 refills | Status: DC

## 2023-07-31 NOTE — Patient Instructions (Addendum)
 ICD-10-CM   1. ILD (interstitial lung disease) (HCC)  J84.9     2. Sarcoidosis  D86.9     3. Multiple lung nodules  R91.8     4. Acute sinusitis, recurrence not specified, unspecified location  J01.90     5. Obstructive sleep apnea  G47.33       ILD (interstitial lung disease) (HCC) Sarcoidosis   - PFT stable 2018-> May 2025   Plan  =  continue supportive care - do HRCT in 3 monts  Multiple lung nodules   - waxing and waning quality over time lowers cancer risk - lAst CT Dec 2024  Plan  - repet HRCT in 3 months  Acute sinusitis, recurrence  not specified, unspecified location   - unclear why not resolved despited antbiiotics  Pl;an  - check cbc with diff, bmet, IgE - Please take prednisone  40 mg x1 day, then 30 mg x1 day, then 20 mg x1 day, then 10 mg x1 day, and then 5 mg x1 day and stop   Obstructive sleep apnea  Plan  - per sleep doc  Follow-up - 3 months 15-minute visit with Dr. Bertrum Brodie but after CT

## 2023-07-31 NOTE — Progress Notes (Signed)
 OCT 2023 [name]  She is a patient of Sheila Reynolds with history of sarcoidosis diagnosed as Sheila sarcoidosis around early 66s.  She never had a biopsy done and was treated intermittently with prednisone .  She has been told of late that the sarcoidosis is in remission.  Her recent CT scans have demonstrated pulmonary fibrosis and she has been referred here for further evaluation  Complains of intermittent chest tightness and pain.  Has dyspnea on exertion.  Denies any cough, congestion, sputum production  Pets: Dogs Occupation: Retired IT consultant Exposures: No recent mold exposure from a leak in 2022.  No ongoing exposures.  No mold, hot tub, Jacuzzi.  No feather pillows or comforters ILD questionnaire 09/30/2021-negative Smoking history: Quit smoking in 1983 Travel history: No significant travel history Relevant family history: No family history of lung disease.  Interim history: Here for review of PFTs.  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  She does have peribronchovascular nodularity in the upper lobe suggestive of sarcoidosis.  No connective tissue disease serologies or exposures noted.  Sarcoidosis can cause pulmonary fibrosis in a similar pattern.  Alternatively IPF can coexist with sarcoidosis.  Her interstitial lung disease has been stable on CT scans dating back several years.  We discussed further work-up and possible biopsy.  Given stability we will monitor for now PFTs reviewed.  She has been started on azathioprine by Sheila Reynolds for inflammatory arthritis which may be related to  Reynolds.  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  Following CT for ILD progression- may be both old Reynolds and UIP. Pulmonary Rehab- done               ED for Covid 01/30/22> benzonatate  CPAP  Luna- 5-15 / [employer]    Saw Dr Sheila Reynolds for replacement Sheila Reynolds Download compliance- CPAP used 2-3 days ago. Body weight today-166 lbs Not driving for 6 months after possible seizure, Sputum cultures from June all nl flora/ yeast, neg AFB. Took doxy after these cultures obtained. Had Flu vax Still some night sweats, productive cough, DOE but not worse. She is aware memory not good- forgets CPAP- discussed. Had flu vax. CT chest 07/25/22- IMPRESSION: 1. New irregular solid nodule of the right lung apex, dominant nodule described on prior exam has resolved, and new clustered small solid nodules are seen in the posterior right upper lobe. Findings are likely due to chronic atypical infection. Recommend continued follow-up based on patient risk factors. 2. 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 Am J Respir Crit Care Med Vol 198, Iss 5,  [id number], Oct 11 2016. 3. Aortic Atherosclerosis (ICD10-I70.0) and Emphysema (ICD10-J43.9).   OV 111@4  - QIHKV    Patient ID: Sheila Reynolds, female    DOB:  [birth year], 78 y.o.   MRN: [id number]  HPI The patient is a 78 year old former smoker with a history of COPD, sarcoidosis, diabetes, hypertension, hyperlipidemia, and obstructive sleep apnea (OSA) on CPAP. She was recently hospitalized following an episode of syncope and head trauma, during which no seizure activity was observed. A urinary tract infection was identified and treated with Augmentin  during the hospitalization. A CT pulmonary angiogram (CTPA) performed during the hospital stay revealed a speculated incavitary nodule in the right upper lobe, measuring 1.4 by 2.4 cm, which was slightly larger than on a previous CT chest. The patient has been under the care of Sheila Reynolds for her sarcoidosis.  The patient's sarcoidosis was diagnosed based on Sheila manifestations and a family history of the condition, although no lung tests were performed. She has been on various medications for the condition, including methotrexate  and steroids, and is currently on azathioprine, which was started less than a year ago. The patient also has a history of arthritis.  The patient experienced a blackout and head trauma, which did not break the skin. A neurologist diagnosed her with a concussion. She has been experiencing numbness on one side of her face and inside her mouth for the past two to three months, which has been evaluated by a dentist with no clear cause identified. The patient's daughter also attended the consultation.   OV 02/23/2023 visit with Dr. Stephania Reynolds mistake in scheduling but ultimately decided to stick with Dr. Bertrum Reynolds for fibrosis]  Subjective:  Patient ID: Sheila Reynolds, female , DOB: 1945/07/24 , age 35 y.o. , MRN: [id number] , ADDRESS: [address] [place] [address] PCP Sheila Curling, MD Patient Care Team: Sheila Curling, MD as PCP - General (Internal Medicine) Sheila Sos, MD as PCP - Cardiology (Cardiology) Reynolds, Sheila Purple, MD as Consulting Physician  (Cardiology) Sheila Reynolds, [facility] (Inactive) (Pharmacist)  This Provider for this visit: Treatment Team:  Attending Provider: Maire Scot, MD    02/23/2023 -   Chief Complaint  Patient presents with   Follow-up    Pt states she has been more sob, currently having sinus issues. Using albuterol  and anoro      HPI Jolane H Prue 78 y.o. -presents for follow-up.  When I walked in she and husband but under the impression they are going to see Dr. Racheal Buddle for the lung nodule.  There is no clear-cut referral from Dr. Baldwin Levee or Dr. Rosa College to see me.  So history is gained by talking to the patient, husband on also later chart review after she left the office.  It appears that she has sleep apnea for which she is follows with Sheila Reynolds and she uses nighttime CPAP without oxygen .  Late in 2024 she got diagnosed with a cavitary spiculated nodule.  She was referred to [facility] by them did a follow-up CT scan of the chest that was read out on Christmas Day 2024.  The nodule spiculated nodule resolved.  She has some chronic changes of sarcoidosis.  She is supposed to see Dr. Byrum for this visit but for unclear reasons she is with me.  Then I reviewed the chart and realized that she has seen Dr. Waylan Reynolds twice in 2023 for ILD.  She is diagnosed to have coexistent Reynolds along with probable UIP.  Back in  2023 she was started on azathioprine for her inflammatory arthritis related to Reynolds.  As of 2023 her CT scan and pulmonary function test was deemed stable.  Looks like she never followed up back with Dr. Waylan Reynolds.  At this point in time she is on Anoro and Singulair  but I do not see azathioprine or any immunomodulators on her medication list.  She had a ED visit 02/09/2023 for acute cystitis with hematuria.  She did see Neomi Banks cardiology PA today.  She reported to her chronic severe fatigue.  She describes the same to me has been going on for many years.  Review of the records indicate  that she has had his ILD.  Most recent CT chest and compared to 2018 all looks stable to me.  At least in the radiology reports she has been stable between 2022 and 2024 with a probable UIP.  She said she had lab work with cardiology today but at the time of this dictation I do not see this.  However end of October 2024 she had a slightly high creatinine 1.01 mg percent slightly high AST.  She has negative serology in 2023 except for trace positive ANA 1: 40.  Echocardiogram October 2024 ejection fraction 65%.  Sit/stand test today pulse ox drop from 100% to 93% after sitting and standing 15 times.  Heart rate ranged from 86 to 102/min.  Myocardial perfusion test normal in 2021  Last set of pulmonary function test November 2024 although the [facility] is declining the TLC has been stable for years.  To me the radiology images ILD is also stable.  Follow-up: I discussed with Dr. Rupert Counts and patient has an appointment with him for sleep apnea.  He said this could be deferred for 6 months.  Did indicate to her that the nodule she saw Dr. Baldwin Levee for is resolved..  Therefore I will see her for ILD and she is agreed to that.      CLINICAL DATA:  Follow-up lung nodule   EXAM: CT CHEST WITHOUT CONTRAST   TECHNIQUE: Multidetector CT imaging of the chest was performed following the standard protocol without IV contrast.   RADIATION DOSE REDUCTION: This exam was performed according to the departmental dose-optimization program which includes automated exposure control, adjustment of the mA and/or kV according to patient size and/or use of iterative reconstruction technique.   COMPARISON:  CTA chest dated 12/03/2022   FINDINGS: Cardiovascular: Normal heart size. No significant pericardial fluid/thickening. Great vessels are normal in course and caliber. Coronary artery calcifications. LAD coronary stent. Coronary artery calcifications and aortic atherosclerosis.   Mediastinum/Nodes: Imaged  thyroid  gland without nodules meeting criteria for imaging follow-up by size. Normal esophagus. No pathologically enlarged axillary, supraclavicular, mediastinal, or hilar lymph nodes. Unchanged prominent subcentimeter left internal mammary lymph nodes measuring up to 3 mm (2:37).   Lungs/Pleura: The central airways are patent. Scattered changes of centrilobular emphysema. Similar upper lobe bronchiectasis. Similar lower lung predominant fibrotic changes. Again seen are waxing and waning tree-in-bud and clustered nodular changes in the right apex, now measuring up to 6 x 5 mm (8:26). Previously noted spiculated cavitary nodule is no longer seen. No pneumothorax. No pleural effusion.   Upper abdomen: Normal.   Musculoskeletal: No acute or abnormal lytic or blastic osseous lesions. Bilateral breast implants.   IMPRESSION: 1. Waxing and waning tree-in-bud and clustered nodular changes in the right apex, now measuring up to 6 x 5 mm. Previously noted spiculated cavitary nodule is no longer seen. Findings  are favored to represent a chronic atypical infectious process. Recommend continued follow-up based on patient risk factors. 2. Similar lower lung predominant fibrotic changes. 3. Aortic Atherosclerosis (ICD10-I70.0) and Emphysema (ICD10-J43.9). Coronary artery calcifications. Assessment for potential risk factor modification, dietary therapy or pharmacologic therapy may be warranted, if clinically indicated.     Electronically Signed   By: Limin  Xu M.D.   On: 02/04/2023 21:37  PFT  OV 07/31/2023  Subjective:  Patient ID: Sheila Reynolds, female , DOB: [birth year] , age 43 y.o. , MRN: [id number] , ADDRESS: [address] [place] [address] PCP Sheila Curling, MD Patient Care Team: Sheila Curling, MD as PCP - General (Internal Medicine) Sheila Sos, MD as PCP - Cardiology (Cardiology) Reynolds, Sheila Purple, MD as Consulting Physician (Cardiology) Sheila Reynolds, [facility]  (Inactive) (Pharmacist) Nathen Balder Skeeter Dukes, RN as [facility] Care Management  This Provider for this visit: Treatment Team:  Attending Provider: Maire Scot, MD    07/31/2023 -   Chief Complaint  Patient presents with   Follow-up    She states has had sinus infection for approx 3 wks. She has a prod cough with yellow sputum, SOB and wheezing. Denies any fevers/aches.      HPI Madelaine Whipple Slovacek 78 y.o. -presents for follow-up.  Presents with her sister in law.  Who [place].  Sister's name is Gracie Lav.  She reports that appointment 3-4 weeks ago she was feeling fine.  She then picked up sinus infection/congestion.  [facility] infection gave her Augmentin  for 7 days but she was not better.  She then followed up with primary care but was still feeling congested.  She went back to primary care they gave her doxycycline  for 7 days that she finished yesterday.  She is been referred to PCP but not been able to see PCP as yet.  She is not feeling well continues to wheeze sputum is still brown and yellow [baseline is clear] more winded also wheezing.  Sinuses definitely feeling congested.  She was told she needs some blood work.  I will get IgE and CBC with differential and chemistry panel.  In terms of her Reynolds/ILD: She had pulmonary function test and it shows 5 to 7 years stability  In terms of her waxing and waning multiple pulmonary nodules: I wanted to present this to the case conference but because of radiology shortage this has not been done so.  But I reassured of the waxing and waning nodules lower the probability for cancer and that we would get another CT scan in 3 months which would be a 44-month follow-up.  She is agreeable with this plan.    SYMPTOM SCALE - ILD 02/23/2023  Current weight   O2 use ra  Shortness of Breath 0 -> 5 scale with 5 being worst (score 6 If unable to do)  At rest 4  Simple tasks - showers, clothes change,  eating, shaving 5  Household (dishes, doing bed, laundry) 4  Shopping 4  Walking level at own pace 4  Walking up Stairs 5  Total (30-36) Dyspnea Score 26  How bad is your cough? 3  How bad is your fatigue 4  How bad is nausea 0  How bad is vomiting?  0  How bad is diarrhea? 3  How bad is anxiety? 3  How bad is depression 2  Any chronic pain - if so where and how bad 0  PFT     Latest Ref Rng & Units 06/29/2023    8:59 AM 01/05/2023   10:31 AM 11/27/2021    1:16 PM 04/13/2020   10:06 AM 11/25/2016    4:11 PM  PFT Results  FVC-Pre L 1.70  1.49  1.67  1.66  1.70   FVC-Predicted Pre % 65  57  63  79  78   FVC-Post L  1.53  1.60  1.59  1.69   FVC-Predicted Post %  59  61  76  78   Pre FEV1/FVC % % 73  81  77  77  81   Post FEV1/FCV % %  79  80  81  83   FEV1-Pre L 1.24  1.20  1.29  1.27  1.39   FEV1-Predicted Pre % 64  62  66  79  82   FEV1-Post L  1.21  1.28  1.28  1.40   DLCO uncorrected ml/min/mmHg 9.11  10.23  9.64  9.27  9.95   DLCO UNC% % 51  57  53  51  46   DLCO corrected ml/min/mmHg  10.72  10.38  9.27  10.18   DLCO COR %Predicted %  60  57  51  47   DLVA Predicted % 79  100  87  80  88   TLC L  2.89  3.24  3.52  3.29   TLC % Predicted %  60  68  73  69   RV % Predicted %  57  71  85  75        LAB RESULTS last 96 hours No results found.       has a past medical history of Anemia, Anxiety, Arthritis, Asthma, Bronchitis with emphysema, Chest pain, Chronic bronchitis (HCC), Coronary artery disease, Family history of anesthesia complication, Fibromyalgia, GERD (gastroesophageal reflux disease), Glaucoma, Heart murmur, History of hiatal hernia, echocardiogram, Hyperlipidemia, Hypertension, Nonsustained ventricular tachycardia (HCC), OSA on CPAP, Premature atrial contractions, PVC (premature ventricular contraction), Sarcoidosis, and Type II diabetes mellitus (HCC).   reports that she quit smoking about 43 years ago. Her smoking use included cigarettes.  She started smoking about 47 years ago. She has a 4 pack-year smoking history. She has never used smokeless tobacco.  Past Surgical History:  Procedure Laterality Date   BREAST SURGERY     CARDIAC CATHETERIZATION  05/04/2007   reveals overall normal left ventricular systolic function. Ejection fraction 65-70%   CATARACT EXTRACTION W/PHACO Right 07/20/2013   Procedure: CATARACT EXTRACTION PHACO AND INTRAOCULAR LENS PLACEMENT (IOC);  Surgeon: Ben Bracken, MD;  Location: [facility] OR;  Service: Ophthalmology;  Laterality: Right;   COLONOSCOPY W/ BIOPSIES AND POLYPECTOMY     COLONOSCOPY WITH PROPOFOL  N/A 09/07/2014   Procedure: COLONOSCOPY WITH PROPOFOL ;  Surgeon: Tami Falcon, MD;  Location: [facility];  Service: Endoscopy;  Laterality: N/A;   COLONOSCOPY WITH PROPOFOL  N/A 01/10/2020   Procedure: COLONOSCOPY WITH PROPOFOL ;  Surgeon: Tami Falcon, MD;  Location: [facility];  Service: Endoscopy;  Laterality: N/A;   CORONARY ANGIOPLASTY WITH STENT PLACEMENT  01/15/2017   CORONARY ATHERECTOMY N/A 01/15/2017   Procedure: CORONARY ATHERECTOMY;  Surgeon: Sammy Crisp, MD;  Location: [facility];  Service: Cardiovascular;  Laterality: N/A;   CORONARY BALLOON ANGIOPLASTY N/A 01/15/2017   Procedure: CORONARY BALLOON ANGIOPLASTY;  Surgeon: Sammy Crisp, MD;  Location: [facility];  Service: Cardiovascular;  Laterality: N/A;   CORONARY PRESSURE/FFR STUDY N/A 12/12/2016   Procedure: INTRAVASCULAR PRESSURE WIRE/FFR STUDY;  Surgeon: Sammy Crisp, MD;  Location: [facility];  Service: Cardiovascular;  Laterality: N/A;   CORONARY STENT INTERVENTION N/A 01/15/2017   Procedure: CORONARY STENT INTERVENTION;  Surgeon: Sammy Crisp, MD;  Location: [facility];  Service: Cardiovascular;  Laterality: N/A;   ESOPHAGOGASTRODUODENOSCOPY (EGD) WITH PROPOFOL  N/A 09/07/2014   Procedure: ESOPHAGOGASTRODUODENOSCOPY (EGD) WITH PROPOFOL ;  Surgeon: Tami Falcon, MD;  Location: [facility];   Service: Endoscopy;  Laterality: N/A;   EXTERNAL EAR SURGERY Bilateral 1970s   tumors removed   EYE SURGERY Left 2019   cataract extraction    EYE SURGERY Right 02/09/2018   eyelid surgery    MINI SHUNT INSERTION Right 07/20/2013   Procedure: INSERTION OF GLAUCOMA FILTRATION DEVICE RIGHT EYE;  Surgeon: Ben Bracken, MD;  Location: [facility] OR;  Service: Ophthalmology;  Laterality: Right;   MITOMYCIN  C APPLICATION Right 07/20/2013   Procedure: MITOMYCIN  C APPLICATION;  Surgeon: Ben Bracken, MD;  Location: [facility] OR;  Service: Ophthalmology;  Laterality: Right;   MITOMYCIN  C APPLICATION Right 02/21/2015   Procedure: MITOMYCIN  C APPLICATION RIGHT EYE;  Surgeon: Ben Bracken, MD;  Location: [facility] OR;  Service: Ophthalmology;  Laterality: Right;   PLACEMENT OF BREAST IMPLANTS Bilateral 1992   took all my breast tissue out; put implants in;fibrocystic breast disease    POLYPECTOMY  01/10/2020   Procedure: POLYPECTOMY;  Surgeon: Tami Falcon, MD;  Location: [facility];  Service: Endoscopy;;   RIGHT/LEFT HEART CATH AND CORONARY ANGIOGRAPHY N/A 12/12/2016   Procedure: RIGHT/LEFT HEART CATH AND CORONARY ANGIOGRAPHY;  Surgeon: Sammy Crisp, MD;  Location: [facility];  Service: Cardiovascular;  Laterality: N/A;   TONSILLECTOMY     TOTAL ABDOMINAL HYSTERECTOMY  1982   TRABECULECTOMY Right 02/21/2015   Procedure: TRABECULECTOMY WITH [facility] ON THE RIGHT EYE;  Surgeon: Ben Bracken, MD;  [facility];  Service: Ophthalmology;  Laterality: Right;    Allergies  Allergen Reactions   Crestor [Rosuvastatin Calcium ] Other (See Comments)    muscle aches   Demerol  [Meperidine Hcl]     Other reaction(s): Hallucinations   Shellfish Allergy Itching and Other (See Comments)    Crab, shrimp and lobster ---lips itch and tingle Was told not to eat again after having a allergy test. Lobster, crab and shrimp     Immunization History  Administered Date(s) Administered   Fluad Quad(high Dose 65+) 12/30/2019,  11/08/2020, 10/21/2021   Influenza Split 10/31/2013   Influenza Whole 12/23/2017   Influenza, High Dose Seasonal PF 11/18/2016, 12/21/2017, 11/23/2018   Influenza,inj,Quad PF,6+ Mos 02/25/2013, 03/19/2015   Influenza,inj,quad, With Preservative 11/11/2018   Influenza-Unspecified 11/11/2022   PFIZER(Purple Top)SARS-COV-2 Vaccination 03/01/2019, 03/22/2019, 11/22/2019   PNEUMOCOCCAL CONJUGATE-20 10/07/2022   Pfizer Covid-19 Vaccine Bivalent Booster 48yrs & up 12/28/2020   Pneumococcal Polysaccharide-23 12/21/2017   Tdap 02/25/2022   Zoster Recombinant(Shingrix ) 12/25/2017, 06/13/2021    Family History  Problem Relation Age of Onset   Heart disease Father    Diabetes Father    Glaucoma Father    Cancer Mother        unknown type, ?lung   Heart disease Paternal Grandmother    Cancer Paternal Grandmother        unknown type   Diabetes Brother    Glaucoma Brother      Current Outpatient Medications:    predniSONE  (DELTASONE ) 10 MG tablet, Please take prednisone  40 mg x1 day, then 30 mg x1 day, then 20 mg x1 day, then 10 mg x1 day, and then 5 mg x1 day  and stop, Disp: 11 tablet, Rfl: 0   acetaminophen  (TYLENOL ) 500 MG tablet, Take 1,000 mg by mouth 2 (two) times daily as needed for moderate pain or headache., Disp: , Rfl:    albuterol  (PROVENTIL ) (2.5 MG/3ML) 0.083% nebulizer solution, Take 3 mLs (2.5 mg total) by nebulization daily as needed for wheezing or shortness of breath., Disp: 75 mL, Rfl: 12   albuterol  (VENTOLIN  HFA) 108 (90 Base) MCG/ACT inhaler, Inhale 2 puffs into the lungs every 6 (six) hours as needed for wheezing or shortness of breath., Disp: 18 g, Rfl: 12   ANORO ELLIPTA  62.5-25 MCG/ACT AEPB, INHALE 1 PUFF INTO THE LUNGS DAILY AT 6 AM (Patient not taking: Reported on 07/31/2023), Disp: 60 each, Rfl: 3   aspirin  EC 81 MG tablet, Take 81 mg by mouth daily. Swallow whole., Disp: , Rfl:    atorvastatin  (LIPITOR) 80 MG tablet, Take 1 tablet (80 mg total) by mouth daily.,  Disp: 90 tablet, Rfl: 3   Blood Glucose Monitoring Suppl (ACCU-CHEK AVIVA PLUS) w/Device KIT, Use to check blood sugars 3 times a day. Dx code e11.65, Disp: 1 kit, Rfl: 3   Blood Glucose Monitoring Suppl (ACCU-CHEK GUIDE) w/Device KIT, USE AS NEEDED TO CHECK BLOOD SUGARS., Disp: 1 kit, Rfl: 0   brimonidine  (ALPHAGAN  P) 0.1 % SOLN, Place 1 drop into both eyes in the morning, at noon, and at bedtime., Disp: , Rfl:    brinzolamide  (AZOPT ) 1 % ophthalmic suspension, Place 1 drop into both eyes 3 (three) times daily., Disp: , Rfl:    carvedilol  (COREG ) 12.5 MG tablet, Take 1 tablet (12.5 mg total) by mouth 2 (two) times daily with a meal., Disp: 180 tablet, Rfl: 3   cetirizine  (ZYRTEC  ALLERGY) 10 MG tablet, Take 1 tablet (10 mg total) by mouth daily. (Patient not taking: Reported on 06/09/2023), Disp: 30 tablet, Rfl: 2   EPINEPHrine  0.3 mg/0.3 mL IJ SOAJ injection, Inject 0.3 mg into the muscle as needed for anaphylaxis., Disp: 1 each, Rfl: 0   ezetimibe  (ZETIA ) 10 MG tablet, Take 1 tablet (10 mg total) by mouth daily., Disp: 90 tablet, Rfl: 1   furosemide  (LASIX ) 40 MG tablet, Take 1 tablet (40 mg total) by mouth daily as needed., Disp: 90 tablet, Rfl: 1   ibuprofen  (ADVIL ) 600 MG tablet, Take 1 tablet (600 mg total) by mouth every 6 (six) hours as needed. (Patient not taking: Reported on 06/09/2023), Disp: 30 tablet, Rfl: 0   isosorbide  mononitrate (IMDUR ) 60 MG 24 hr tablet, TAKE 1 TABLET(60 MG) BY MOUTH DAILY, Disp: 90 tablet, Rfl: 3   JANUMET  50-500 MG tablet, TAKE 1 TABLET BY MOUTH TWICE DAILY WITH A MEAL, Disp: 90 tablet, Rfl: 1   ketorolac  (ACULAR ) 0.5 % ophthalmic solution, Place 1 drop into the right eye 2 (two) times daily., Disp: , Rfl:    Lancets (ONETOUCH DELICA PLUS LANCET33G) MISC, CHECK BLOOD SUGAR BEFORE BREAKFAST AND DINNER, Disp: 100 each, Rfl: 1   loperamide  (IMODIUM ) 2 MG capsule, Take 1 capsule (2 mg total) by mouth 4 (four) times daily as needed for diarrhea or loose stools. (Patient  not taking: Reported on 05/06/2023), Disp: 12 capsule, Rfl: 0   montelukast  (SINGULAIR ) 10 MG tablet, TAKE 1 TABLET BY MOUTH EVERY DAY, Disp: 90 tablet, Rfl: 1   Nebulizers (COMPRESSOR/NEBULIZER) MISC, Use as directed (Patient not taking: Reported on 05/06/2023), Disp: 1 each, Rfl: 0   nitroGLYCERIN  (NITROSTAT ) 0.4 MG SL tablet, Place 1 tablet (0.4 mg total) under the tongue every  5 (five) minutes as needed for chest pain., Disp: 25 tablet, Rfl: prn   ondansetron  (ZOFRAN -ODT) 4 MG disintegrating tablet, Take 1 tablet (4 mg total) by mouth every 8 (eight) hours as needed for nausea or vomiting., Disp: 20 tablet, Rfl: 0   pantoprazole  (PROTONIX ) 40 MG tablet, Take 1 tablet (40 mg total) by mouth daily., Disp: 90 tablet, Rfl: 3   Polyvinyl Alcohol -Povidone PF (REFRESH) 1.4-0.6 % SOLN, Place 1-2 drops into both eyes 3 (three) times daily as needed (dry/irritated eyes.)., Disp: , Rfl:    prednisoLONE acetate (PRED FORTE) 1 % ophthalmic suspension, Place 1 drop into both eyes in the morning, at noon, and at bedtime. Place 2 Drops in Left Eye, Place 3 Drops In Right Eye TID, Disp: , Rfl:    pregabalin  (LYRICA ) 75 MG capsule, TAKE 1 CAPSULE(75 MG) BY MOUTH TWICE DAILY, Disp: 180 capsule, Rfl: 1   RHOPRESSA  0.02 % SOLN, Place 1 drop into the left eye at bedtime., Disp: , Rfl:    tiZANidine  (ZANAFLEX ) 4 MG tablet, Take 0.5 tablets (2 mg total) by mouth every 6 (six) hours as needed for muscle spasms. (Patient not taking: Reported on 05/06/2023), Disp: 15 tablet, Rfl: 0      Objective:   Vitals:   07/31/23 1044 07/31/23 1045  BP:  (!) 150/82  Pulse: 77   Temp:  98.2 F (36.8 C)  TempSrc:  Oral  SpO2: 100%   Weight:  167 lb (75.8 kg)  Height:  5' 2 (1.575 m)    Estimated body mass index is 30.54 kg/m as calculated from the following:   Height as of this encounter: 5' 2 (1.575 m).   Weight as of this encounter: 167 lb (75.8 kg).  @WEIGHTCHANGE @  [employer]   07/31/23 1045  Weight: 167 lb  (75.8 kg)     Physical Exam   General: No distress. Loks ok O2 at rest: no Cane present: no Sitting in wheel chair: no Frail: no Obese: no Neuro: Alert and Oriented x 3. GCS 15. Speech normal Psych: Pleasant Resp:  Barrel Chest - no.  Wheeze - no, Crackles - no, No overt respiratory distress CVS: Normal heart sounds. Murmurs - no Ext: Stigmata of Connective Tissue Disease - no HEENT: Normal upper airway. PEERL +. No post nasal drip . SOUNDS CONGESTED        Assessment:       ICD-10-CM   1. ILD (interstitial lung disease) (HCC)  J84.9 CBC w/Diff    Basic Metabolic Panel (BMET)    IgE    CT Chest High Resolution    2. Sarcoidosis  D86.9 CBC w/Diff    Basic Metabolic Panel (BMET)    IgE    CT Chest High Resolution    3. Multiple lung nodules  R91.8 CBC w/Diff    Basic Metabolic Panel (BMET)    IgE    CT Chest High Resolution    4. Acute sinusitis, recurrence not specified, unspecified location  J01.90 CBC w/Diff    Basic Metabolic Panel (BMET)    IgE    CT Chest High Resolution    5. Obstructive sleep apnea  G47.33 CBC w/Diff    Basic Metabolic Panel (BMET)    IgE    CT Chest High Resolution         Plan:     Patient Instructions     ICD-10-CM   1. ILD (interstitial lung disease) (HCC)  J84.9     2. Sarcoidosis  D86.9  3. Multiple lung nodules  R91.8     4. Acute sinusitis, recurrence not specified, unspecified location  J01.90     5. Obstructive sleep apnea  G47.33       ILD (interstitial lung disease) (HCC) Sarcoidosis   - PFT stable 2018-> May 2025   Plan  =  continue supportive care - do HRCT in 3 monts  Multiple lung nodules   - waxing and waning quality over time lowers cancer risk - lAst CT Dec 2024  Plan  - repet HRCT in 3 months  Acute sinusitis, recurrence  not specified, unspecified location   - unclear why not resolved despited antbiiotics  Pl;an  - check cbc with diff, bmet, IgE - Please take prednisone  40 mg  x1 day, then 30 mg x1 day, then 20 mg x1 day, then 10 mg x1 day, and then 5 mg x1 day and stop   Obstructive sleep apnea  Plan  - per sleep doc  Follow-up - 3 months 15-minute visit with Dr. Bertrum Reynolds but after CT   FOLLOWUP Return in about 3 months (around 10/31/2023) for 15 min visit, with Dr Sheila Reynolds, after HRCT chest.    SIGNATURE    Dr. Maire Scot, M.D., F.C.C.P,  Pulmonary and Critical Care Medicine Staff Physician, [facility] Director - Interstitial Lung Disease  Program  Pulmonary Fibrosis [facility] at [facility] [place], [place], [address]  Pager: [phone], If no answer or between  15:00h - 7:00h: call [phone] Telephone: [phone]  11:20 AM 07/31/2023

## 2023-08-03 LAB — IGE: IgE (Immunoglobulin E), Serum: 179 kU/L — ABNORMAL HIGH (ref <=114)

## 2023-08-04 DIAGNOSIS — Z79899 Other long term (current) drug therapy: Secondary | ICD-10-CM | POA: Diagnosis not present

## 2023-08-04 DIAGNOSIS — H15009 Unspecified scleritis, unspecified eye: Secondary | ICD-10-CM | POA: Diagnosis not present

## 2023-08-04 DIAGNOSIS — M1990 Unspecified osteoarthritis, unspecified site: Secondary | ICD-10-CM | POA: Diagnosis not present

## 2023-08-04 DIAGNOSIS — D869 Sarcoidosis, unspecified: Secondary | ICD-10-CM | POA: Diagnosis not present

## 2023-08-04 DIAGNOSIS — M2550 Pain in unspecified joint: Secondary | ICD-10-CM | POA: Diagnosis not present

## 2023-08-04 DIAGNOSIS — H209 Unspecified iridocyclitis: Secondary | ICD-10-CM | POA: Diagnosis not present

## 2023-08-04 DIAGNOSIS — M0609 Rheumatoid arthritis without rheumatoid factor, multiple sites: Secondary | ICD-10-CM | POA: Diagnosis not present

## 2023-08-04 DIAGNOSIS — M7989 Other specified soft tissue disorders: Secondary | ICD-10-CM | POA: Diagnosis not present

## 2023-08-04 DIAGNOSIS — J8410 Pulmonary fibrosis, unspecified: Secondary | ICD-10-CM | POA: Diagnosis not present

## 2023-08-04 DIAGNOSIS — R768 Other specified abnormal immunological findings in serum: Secondary | ICD-10-CM | POA: Diagnosis not present

## 2023-08-09 ENCOUNTER — Inpatient Hospital Stay (HOSPITAL_COMMUNITY)

## 2023-08-09 ENCOUNTER — Encounter (HOSPITAL_COMMUNITY): Payer: Self-pay

## 2023-08-09 ENCOUNTER — Emergency Department (HOSPITAL_COMMUNITY)

## 2023-08-09 ENCOUNTER — Inpatient Hospital Stay (HOSPITAL_COMMUNITY)
Admission: EM | Admit: 2023-08-09 | Discharge: 2023-08-11 | DRG: 640 | Disposition: A | Discharge status: Home Health | Attending: Internal Medicine | Admitting: Internal Medicine

## 2023-08-09 ENCOUNTER — Other Ambulatory Visit: Payer: Self-pay

## 2023-08-09 DIAGNOSIS — Z043 Encounter for examination and observation following other accident: Secondary | ICD-10-CM | POA: Diagnosis not present

## 2023-08-09 DIAGNOSIS — Z885 Allergy status to narcotic agent status: Secondary | ICD-10-CM

## 2023-08-09 DIAGNOSIS — Z9882 Breast implant status: Secondary | ICD-10-CM

## 2023-08-09 DIAGNOSIS — E860 Dehydration: Secondary | ICD-10-CM | POA: Diagnosis present

## 2023-08-09 DIAGNOSIS — M797 Fibromyalgia: Secondary | ICD-10-CM | POA: Diagnosis present

## 2023-08-09 DIAGNOSIS — R197 Diarrhea, unspecified: Secondary | ICD-10-CM | POA: Diagnosis present

## 2023-08-09 DIAGNOSIS — E785 Hyperlipidemia, unspecified: Secondary | ICD-10-CM | POA: Diagnosis present

## 2023-08-09 DIAGNOSIS — K5730 Diverticulosis of large intestine without perforation or abscess without bleeding: Secondary | ICD-10-CM | POA: Diagnosis not present

## 2023-08-09 DIAGNOSIS — Z7984 Long term (current) use of oral hypoglycemic drugs: Secondary | ICD-10-CM

## 2023-08-09 DIAGNOSIS — R4182 Altered mental status, unspecified: Secondary | ICD-10-CM | POA: Diagnosis not present

## 2023-08-09 DIAGNOSIS — R0689 Other abnormalities of breathing: Secondary | ICD-10-CM | POA: Diagnosis not present

## 2023-08-09 DIAGNOSIS — D8689 Sarcoidosis of other sites: Secondary | ICD-10-CM | POA: Diagnosis present

## 2023-08-09 DIAGNOSIS — I2510 Atherosclerotic heart disease of native coronary artery without angina pectoris: Secondary | ICD-10-CM | POA: Diagnosis present

## 2023-08-09 DIAGNOSIS — Z888 Allergy status to other drugs, medicaments and biological substances status: Secondary | ICD-10-CM

## 2023-08-09 DIAGNOSIS — K429 Umbilical hernia without obstruction or gangrene: Secondary | ICD-10-CM | POA: Diagnosis not present

## 2023-08-09 DIAGNOSIS — I493 Ventricular premature depolarization: Secondary | ICD-10-CM | POA: Diagnosis present

## 2023-08-09 DIAGNOSIS — M25532 Pain in left wrist: Secondary | ICD-10-CM | POA: Diagnosis present

## 2023-08-09 DIAGNOSIS — Z6830 Body mass index (BMI) 30.0-30.9, adult: Secondary | ICD-10-CM

## 2023-08-09 DIAGNOSIS — R509 Fever, unspecified: Secondary | ICD-10-CM | POA: Diagnosis not present

## 2023-08-09 DIAGNOSIS — R4020 Unspecified coma: Secondary | ICD-10-CM | POA: Diagnosis not present

## 2023-08-09 DIAGNOSIS — Z8673 Personal history of transient ischemic attack (TIA), and cerebral infarction without residual deficits: Secondary | ICD-10-CM

## 2023-08-09 DIAGNOSIS — J440 Chronic obstructive pulmonary disease with acute lower respiratory infection: Secondary | ICD-10-CM | POA: Diagnosis present

## 2023-08-09 DIAGNOSIS — Z833 Family history of diabetes mellitus: Secondary | ICD-10-CM | POA: Diagnosis not present

## 2023-08-09 DIAGNOSIS — J8410 Pulmonary fibrosis, unspecified: Secondary | ICD-10-CM | POA: Diagnosis present

## 2023-08-09 DIAGNOSIS — Y92012 Bathroom of single-family (private) house as the place of occurrence of the external cause: Secondary | ICD-10-CM

## 2023-08-09 DIAGNOSIS — G4733 Obstructive sleep apnea (adult) (pediatric): Secondary | ICD-10-CM | POA: Diagnosis present

## 2023-08-09 DIAGNOSIS — I479 Paroxysmal tachycardia, unspecified: Secondary | ICD-10-CM | POA: Diagnosis not present

## 2023-08-09 DIAGNOSIS — J439 Emphysema, unspecified: Secondary | ICD-10-CM | POA: Diagnosis not present

## 2023-08-09 DIAGNOSIS — I3139 Other pericardial effusion (noninflammatory): Secondary | ICD-10-CM | POA: Diagnosis not present

## 2023-08-09 DIAGNOSIS — R55 Syncope and collapse: Secondary | ICD-10-CM | POA: Diagnosis not present

## 2023-08-09 DIAGNOSIS — R000 Tachycardia, unspecified: Secondary | ICD-10-CM | POA: Diagnosis not present

## 2023-08-09 DIAGNOSIS — B964 Proteus (mirabilis) (morganii) as the cause of diseases classified elsewhere: Secondary | ICD-10-CM | POA: Diagnosis present

## 2023-08-09 DIAGNOSIS — K219 Gastro-esophageal reflux disease without esophagitis: Secondary | ICD-10-CM | POA: Diagnosis present

## 2023-08-09 DIAGNOSIS — E66811 Obesity, class 1: Secondary | ICD-10-CM | POA: Diagnosis not present

## 2023-08-09 DIAGNOSIS — R609 Edema, unspecified: Secondary | ICD-10-CM | POA: Diagnosis not present

## 2023-08-09 DIAGNOSIS — Z79899 Other long term (current) drug therapy: Secondary | ICD-10-CM

## 2023-08-09 DIAGNOSIS — J189 Pneumonia, unspecified organism: Secondary | ICD-10-CM | POA: Diagnosis not present

## 2023-08-09 DIAGNOSIS — R519 Headache, unspecified: Secondary | ICD-10-CM | POA: Diagnosis present

## 2023-08-09 DIAGNOSIS — Z8249 Family history of ischemic heart disease and other diseases of the circulatory system: Secondary | ICD-10-CM | POA: Diagnosis not present

## 2023-08-09 DIAGNOSIS — R410 Disorientation, unspecified: Secondary | ICD-10-CM | POA: Diagnosis not present

## 2023-08-09 DIAGNOSIS — N3000 Acute cystitis without hematuria: Principal | ICD-10-CM | POA: Diagnosis present

## 2023-08-09 DIAGNOSIS — E1151 Type 2 diabetes mellitus with diabetic peripheral angiopathy without gangrene: Secondary | ICD-10-CM | POA: Diagnosis present

## 2023-08-09 DIAGNOSIS — Z83511 Family history of glaucoma: Secondary | ICD-10-CM

## 2023-08-09 DIAGNOSIS — Z955 Presence of coronary angioplasty implant and graft: Secondary | ICD-10-CM

## 2023-08-09 DIAGNOSIS — W19XXXA Unspecified fall, initial encounter: Secondary | ICD-10-CM | POA: Diagnosis present

## 2023-08-09 DIAGNOSIS — Z87891 Personal history of nicotine dependence: Secondary | ICD-10-CM | POA: Diagnosis not present

## 2023-08-09 DIAGNOSIS — Z91013 Allergy to seafood: Secondary | ICD-10-CM

## 2023-08-09 DIAGNOSIS — I10 Essential (primary) hypertension: Secondary | ICD-10-CM | POA: Diagnosis not present

## 2023-08-09 DIAGNOSIS — Z7982 Long term (current) use of aspirin: Secondary | ICD-10-CM

## 2023-08-09 DIAGNOSIS — J984 Other disorders of lung: Secondary | ICD-10-CM

## 2023-08-09 DIAGNOSIS — R0989 Other specified symptoms and signs involving the circulatory and respiratory systems: Secondary | ICD-10-CM | POA: Diagnosis not present

## 2023-08-09 DIAGNOSIS — R112 Nausea with vomiting, unspecified: Secondary | ICD-10-CM | POA: Diagnosis not present

## 2023-08-09 DIAGNOSIS — N390 Urinary tract infection, site not specified: Secondary | ICD-10-CM

## 2023-08-09 DIAGNOSIS — Z90710 Acquired absence of both cervix and uterus: Secondary | ICD-10-CM

## 2023-08-09 DIAGNOSIS — J168 Pneumonia due to other specified infectious organisms: Secondary | ICD-10-CM | POA: Diagnosis not present

## 2023-08-09 DIAGNOSIS — R0682 Tachypnea, not elsewhere classified: Secondary | ICD-10-CM | POA: Diagnosis not present

## 2023-08-09 LAB — URINALYSIS, W/ REFLEX TO CULTURE (INFECTION SUSPECTED)
Bilirubin Urine: NEGATIVE
Glucose, UA: NEGATIVE mg/dL
Ketones, ur: NEGATIVE mg/dL
Nitrite: NEGATIVE
Protein, ur: NEGATIVE mg/dL
Specific Gravity, Urine: 1.013 (ref 1.005–1.030)
pH: 6 (ref 5.0–8.0)

## 2023-08-09 LAB — COMPREHENSIVE METABOLIC PANEL WITH GFR
ALT: 20 U/L (ref 0–44)
ALT: 24 U/L (ref 0–44)
AST: 22 U/L (ref 15–41)
AST: 34 U/L (ref 15–41)
Albumin: 3.9 g/dL (ref 3.5–5.0)
Albumin: 3.5 g/dL (ref 3.5–5.0)
Alkaline Phosphatase: 68 U/L (ref 38–126)
Alkaline Phosphatase: 65 U/L (ref 38–126)
Anion gap: 13 (ref 5–15)
Anion gap: 13 (ref 5–15)
BUN: 16 mg/dL (ref 8–23)
BUN: 15 mg/dL (ref 8–23)
CO2: 22 mmol/L (ref 22–32)
CO2: 23 mmol/L (ref 22–32)
Calcium: 9.3 mg/dL (ref 8.9–10.3)
Calcium: 8.9 mg/dL (ref 8.9–10.3)
Chloride: 100 mmol/L (ref 98–111)
Chloride: 100 mmol/L (ref 98–111)
Creatinine, Ser: 0.81 mg/dL (ref 0.44–1.00)
Creatinine, Ser: 0.77 mg/dL (ref 0.44–1.00)
GFR, Estimated: >60 mL/min (ref >60)
GFR, Estimated: >60 mL/min (ref >60)
Glucose, Bld: 147 mg/dL — ABNORMAL HIGH (ref 70–99)
Glucose, Bld: 133 mg/dL — ABNORMAL HIGH (ref 70–99)
Potassium: 3.8 mmol/L (ref 3.5–5.1)
Potassium: 4.1 mmol/L (ref 3.5–5.1)
Sodium: 135 mmol/L (ref 135–145)
Sodium: 136 mmol/L (ref 135–145)
Total Bilirubin: 0.9 mg/dL (ref 0.0–1.2)
Total Bilirubin: 1.5 mg/dL — ABNORMAL HIGH (ref 0.0–1.2)
Total Protein: 7.3 g/dL (ref 6.5–8.1)
Total Protein: 6.5 g/dL (ref 6.5–8.1)

## 2023-08-09 LAB — CBC WITH DIFFERENTIAL/PLATELET
Abs Immature Granulocytes: 0.16 10*3/uL — ABNORMAL HIGH (ref 0.00–0.07)
Basophils Absolute: 0 10*3/uL (ref 0.0–0.1)
Basophils Relative: 1 %
Eosinophils Absolute: 0.2 10*3/uL (ref 0.0–0.5)
Eosinophils Relative: 2 %
HCT: 42.3 % (ref 36.0–46.0)
Hemoglobin: 13.7 g/dL (ref 12.0–15.0)
Immature Granulocytes: 2 %
Lymphocytes Relative: 5 %
Lymphs Abs: 0.4 10*3/uL — ABNORMAL LOW (ref 0.7–4.0)
MCH: 28.1 pg (ref 26.0–34.0)
MCHC: 32.4 g/dL (ref 30.0–36.0)
MCV: 86.9 fL (ref 80.0–100.0)
Monocytes Absolute: 1.1 10*3/uL — ABNORMAL HIGH (ref 0.1–1.0)
Monocytes Relative: 14 %
Neutro Abs: 6 10*3/uL (ref 1.7–7.7)
Neutrophils Relative %: 76 %
Platelets: 199 10*3/uL (ref 150–400)
RBC: 4.87 MIL/uL (ref 3.87–5.11)
RDW: 13.8 % (ref 11.5–15.5)
WBC: 7.9 10*3/uL (ref 4.0–10.5)
nRBC: 0 % (ref 0.0–0.2)

## 2023-08-09 LAB — CK
Total CK: 100 U/L (ref 38–234)
Total CK: 158 U/L (ref 38–234)

## 2023-08-09 LAB — CBG MONITORING, ED
Glucose-Capillary: 120 mg/dL — ABNORMAL HIGH (ref 70–99)
Glucose-Capillary: 122 mg/dL — ABNORMAL HIGH (ref 70–99)

## 2023-08-09 LAB — RESP PANEL BY RT-PCR (RSV, FLU A&B, COVID)  RVPGX2
Influenza A by PCR: NEGATIVE
Influenza B by PCR: NEGATIVE
Resp Syncytial Virus by PCR: NEGATIVE
SARS Coronavirus 2 by RT PCR: NEGATIVE

## 2023-08-09 LAB — I-STAT CG4 LACTIC ACID, ED
Lactic Acid, Venous: 1.6 mmol/L (ref 0.5–1.9)
Lactic Acid, Venous: 1.1 mmol/L (ref 0.5–1.9)

## 2023-08-09 LAB — TROPONIN I (HIGH SENSITIVITY)
Troponin I (High Sensitivity): 7 ng/L (ref <18)
Troponin I (High Sensitivity): 7 ng/L (ref <18)

## 2023-08-09 LAB — PROCALCITONIN: Procalcitonin: 0.7 ng/mL

## 2023-08-09 LAB — D-DIMER, QUANTITATIVE: D-Dimer, Quant: 3.74 ug{FEU}/mL — ABNORMAL HIGH (ref 0.00–0.50)

## 2023-08-09 MED ORDER — ASPIRIN 81 MG PO TBEC
81.0000 mg | DELAYED_RELEASE_TABLET | Freq: Every day | ORAL | Status: DC
  Administered 2023-08-09 – 2023-08-11 (×3): 81 mg via ORAL
  Filled 2023-08-09 (×3): qty 1

## 2023-08-09 MED ORDER — SODIUM CHLORIDE 0.9% FLUSH
3.0000 mL | Freq: Two times a day (BID) | INTRAVENOUS | Status: DC
  Administered 2023-08-09 – 2023-08-10 (×2): 10 mL via INTRAVENOUS
  Administered 2023-08-10: 3 mL via INTRAVENOUS
  Administered 2023-08-11: 10 mL via INTRAVENOUS

## 2023-08-09 MED ORDER — STROKE: EARLY STAGES OF RECOVERY BOOK
Freq: Once | Status: AC
  Filled 2023-08-09: qty 1

## 2023-08-09 MED ORDER — IOHEXOL 350 MG/ML SOLN
100.0000 mL | Freq: Once | INTRAVENOUS | Status: AC | PRN
  Administered 2023-08-09: 100 mL via INTRAVENOUS

## 2023-08-09 MED ORDER — SODIUM CHLORIDE 0.9 % IV SOLN
1.0000 g | Freq: Once | INTRAVENOUS | Status: AC
  Administered 2023-08-09: 1 g via INTRAVENOUS
  Filled 2023-08-09: qty 10

## 2023-08-09 MED ORDER — ACETAMINOPHEN 325 MG PO TABS
650.0000 mg | ORAL_TABLET | Freq: Once | ORAL | Status: AC
  Administered 2023-08-09: 650 mg via ORAL
  Filled 2023-08-09: qty 2

## 2023-08-09 MED ORDER — INSULIN ASPART 100 UNIT/ML IJ SOLN
0.0000 [IU] | INTRAMUSCULAR | Status: DC
  Administered 2023-08-09 – 2023-08-10 (×2): 1 [IU] via SUBCUTANEOUS

## 2023-08-09 MED ORDER — PREGABALIN 75 MG PO CAPS
75.0000 mg | ORAL_CAPSULE | Freq: Two times a day (BID) | ORAL | Status: DC
  Administered 2023-08-09 – 2023-08-11 (×5): 75 mg via ORAL
  Filled 2023-08-09 (×2): qty 3
  Filled 2023-08-09: qty 1
  Filled 2023-08-09: qty 3
  Filled 2023-08-09: qty 1

## 2023-08-09 MED ORDER — MONTELUKAST SODIUM 10 MG PO TABS
10.0000 mg | ORAL_TABLET | Freq: Every day | ORAL | Status: DC
  Administered 2023-08-09 – 2023-08-11 (×3): 10 mg via ORAL
  Filled 2023-08-09 (×3): qty 1

## 2023-08-09 MED ORDER — ACETAMINOPHEN 325 MG PO TABS
650.0000 mg | ORAL_TABLET | ORAL | Status: DC | PRN
Start: 2023-08-09 — End: 2023-08-11
  Administered 2023-08-11: 650 mg via ORAL
  Filled 2023-08-09: qty 2

## 2023-08-09 MED ORDER — METOCLOPRAMIDE HCL 5 MG/ML IJ SOLN
10.0000 mg | Freq: Once | INTRAMUSCULAR | Status: AC
  Administered 2023-08-09: 10 mg via INTRAVENOUS
  Filled 2023-08-09: qty 2

## 2023-08-09 MED ORDER — EZETIMIBE 10 MG PO TABS
10.0000 mg | ORAL_TABLET | Freq: Every day | ORAL | Status: DC
  Administered 2023-08-09 – 2023-08-11 (×3): 10 mg via ORAL
  Filled 2023-08-09 (×3): qty 1

## 2023-08-09 MED ORDER — NITROGLYCERIN 0.4 MG SL SUBL: 0.4000 mg | SUBLINGUAL_TABLET | SUBLINGUAL | Status: DC | PRN

## 2023-08-09 MED ORDER — PREDNISOLONE ACETATE 1 % OP SUSP
2.0000 [drp] | Freq: Three times a day (TID) | OPHTHALMIC | Status: DC
  Administered 2023-08-10 – 2023-08-11 (×5): 2 [drp] via OPHTHALMIC
  Filled 2023-08-09 (×2): qty 5

## 2023-08-09 MED ORDER — KETOROLAC TROMETHAMINE 0.5 % OP SOLN
1.0000 [drp] | Freq: Two times a day (BID) | OPHTHALMIC | Status: DC
  Administered 2023-08-10 – 2023-08-11 (×3): 1 [drp] via OPHTHALMIC
  Filled 2023-08-09 (×2): qty 5

## 2023-08-09 MED ORDER — SODIUM CHLORIDE 0.9% FLUSH: 3.0000 mL | INTRAVENOUS | Status: DC | PRN

## 2023-08-09 MED ORDER — HYDRALAZINE HCL 20 MG/ML IJ SOLN: 10.0000 mg | Freq: Four times a day (QID) | INTRAMUSCULAR | Status: DC | PRN

## 2023-08-09 MED ORDER — BRIMONIDINE TARTRATE 0.2 % OP SOLN
1.0000 [drp] | Freq: Three times a day (TID) | OPHTHALMIC | Status: DC
  Administered 2023-08-10 – 2023-08-11 (×5): 1 [drp] via OPHTHALMIC
  Filled 2023-08-09 (×2): qty 5

## 2023-08-09 MED ORDER — ACETAMINOPHEN 160 MG/5ML PO SOLN: 650.0000 mg | ORAL | Status: DC | PRN

## 2023-08-09 MED ORDER — ATORVASTATIN CALCIUM 80 MG PO TABS
80.0000 mg | ORAL_TABLET | Freq: Every day | ORAL | Status: DC
  Administered 2023-08-09 – 2023-08-11 (×3): 80 mg via ORAL
  Filled 2023-08-09 (×2): qty 1
  Filled 2023-08-09: qty 2

## 2023-08-09 MED ORDER — AZATHIOPRINE 25 MG PO HALF TABLET
75.0000 mg | ORAL_TABLET | Freq: Every day | ORAL | Status: DC
  Administered 2023-08-09 – 2023-08-11 (×3): 75 mg via ORAL
  Filled 2023-08-09 (×3): qty 3

## 2023-08-09 MED ORDER — BRINZOLAMIDE 1 % OP SUSP
1.0000 [drp] | Freq: Three times a day (TID) | OPHTHALMIC | Status: DC
  Administered 2023-08-10 – 2023-08-11 (×4): 1 [drp] via OPHTHALMIC
  Filled 2023-08-09 (×3): qty 10

## 2023-08-09 MED ORDER — ALBUTEROL SULFATE (2.5 MG/3ML) 0.083% IN NEBU: 2.5000 mg | INHALATION_SOLUTION | Freq: Four times a day (QID) | RESPIRATORY_TRACT | Status: DC | PRN

## 2023-08-09 MED ORDER — HEPARIN SODIUM (PORCINE) 5000 UNIT/ML IJ SOLN
5000.0000 [IU] | Freq: Three times a day (TID) | INTRAMUSCULAR | Status: DC
  Administered 2023-08-09 – 2023-08-11 (×5): 5000 [IU] via SUBCUTANEOUS
  Filled 2023-08-09 (×5): qty 1

## 2023-08-09 MED ORDER — PANTOPRAZOLE SODIUM 40 MG PO TBEC
40.0000 mg | DELAYED_RELEASE_TABLET | Freq: Every day | ORAL | Status: DC
  Administered 2023-08-09 – 2023-08-11 (×3): 40 mg via ORAL
  Filled 2023-08-09 (×3): qty 1

## 2023-08-09 MED ORDER — ACETAMINOPHEN 650 MG RE SUPP: 650.0000 mg | RECTAL | Status: DC | PRN

## 2023-08-09 MED ORDER — DOXYCYCLINE HYCLATE 100 MG PO TABS
100.0000 mg | ORAL_TABLET | Freq: Once | ORAL | Status: AC
  Administered 2023-08-09: 100 mg via ORAL
  Filled 2023-08-09: qty 1

## 2023-08-09 NOTE — ED Provider Notes (Signed)
 [place] EMERGENCY DEPARTMENT AT [facility] Provider Note   CSN: [id number] Arrival date & time: 08/09/23  9240     Patient presents with: possible sepsis   Sheila Reynolds is a 78 y.o. female.   HPI     78 year old patient comes in with chief complaint of weakness, unresponsiveness. Patient has past medical history of sarcoidosis, diabetes, peripheral vascular disease, previous strokes.   Collateral history provided by patient's husband, was at the bedside.  According to the patient's husband, patient had gone to her sister's birthday party yesterday.  She returned around 8 PM.  Patient indicated to him that she was tired and wanted to lay down.  When the husband went to bed at 1:30 AM, she told him that she was feeling cold.  Soon after, patient informed the husband that she felt nauseous.  Patient proceeded to vomit in the bedroom after her husband gave her a trash can.  Thereafter patient went to the bathroom.  This morning, husband found her laying in the bathroom, with body fluid around her.  When he woke patient up, she was coherent.  He called EMS as patient just looked very weak.  According the patient, the last thing she remembers is feeling nauseous in the bedroom.  She does not remember walking into the bathroom or her husband trying to help her.  The next thing she remembers is EMS helping her.  There is no history of seizures.  Family does indicate that with previous stroke, patient has had  zoning out moments and being confused or fainting episodes.  Currently patient is complaining of left wrist pain, headache.  She had some abdominal pain yesterday.  Review of system is also positive for burning with urination.  Patient has had a cough now for 3 weeks.   Prior to Admission medications   Medication Sig Start Date End Date Taking? Authorizing Provider  acetaminophen  (TYLENOL ) 500 MG tablet Take 1,000 mg by mouth 2 (two) times daily as needed for moderate  pain or headache.    [provider]  albuterol  (PROVENTIL ) (2.5 MG/3ML) 0.083% nebulizer solution Take 3 mLs (2.5 mg total) by nebulization daily as needed for wheezing or shortness of breath. 04/01/21   Georgina Speaks, FNP  albuterol  (VENTOLIN  HFA) [phone] Base) MCG/ACT inhaler Inhale 2 puffs into the lungs every 6 (six) hours as needed for wheezing or shortness of breath. 12/30/19   Neysa Reggy BIRCH, MD  ANORO ELLIPTA  62.5-25 MCG/ACT AEPB INHALE 1 PUFF INTO THE LUNGS DAILY AT 6 AM Patient not taking: Reported on 07/31/2023 02/21/22   [name], Praveen, MD  aspirin  EC 81 MG tablet Take 81 mg by mouth daily. Swallow whole.    [provider]  atorvastatin  (LIPITOR) 80 MG tablet Take 1 tablet (80 mg total) by mouth daily. 02/24/23   Walker, Caitlin S, NP  Blood Glucose Monitoring Suppl (ACCU-CHEK AVIVA PLUS) w/Device KIT Use to check blood sugars 3 times a day. Dx code e11.65 01/03/20   [name], MD  Blood Glucose Monitoring Suppl (ACCU-CHEK GUIDE) w/Device KIT USE AS NEEDED TO CHECK BLOOD SUGARS. 04/16/23   [name], MD  brimonidine  (ALPHAGAN  P) 0.1 % SOLN Place 1 drop into both eyes in the morning, at noon, and at bedtime.    [provider]  brinzolamide  (AZOPT ) 1 % ophthalmic suspension Place 1 drop into both eyes 3 (three) times daily.    [provider]  carvedilol  (COREG ) 12.5 MG tablet Take 1 tablet (  12.5 mg total) by mouth 2 (two) times daily with a meal. 06/19/23   Raford Riggs, MD  cetirizine  (ZYRTEC  ALLERGY) 10 MG tablet Take 1 tablet (10 mg total) by mouth daily. Patient not taking: Reported on 06/09/2023 05/06/23 05/05/24  [name], MD  EPINEPHrine  0.3 mg/0.3 mL IJ SOAJ injection Inject 0.3 mg into the muscle as needed for anaphylaxis. 05/06/23   [name], MD  ezetimibe  (ZETIA ) 10 MG tablet Take 1 tablet (10 mg total) by mouth daily. 01/01/23   [name], [name], MD  furosemide  (LASIX ) 40 MG tablet Take 1 tablet (40 mg total) by mouth  daily as needed. 03/28/22   [name], [name], MD  ibuprofen  (ADVIL ) 600 MG tablet Take 1 tablet (600 mg total) by mouth every 6 (six) hours as needed. Patient not taking: Reported on 06/09/2023 01/01/23   Darral Longs, MD  isosorbide  mononitrate (IMDUR ) 60 MG 24 hr tablet TAKE 1 TABLET(60 MG) BY MOUTH DAILY 06/19/23   Raford Riggs, MD  JANUMET  50-500 MG tablet TAKE 1 TABLET BY MOUTH TWICE DAILY WITH A MEAL 06/09/23   [name], MD  ketorolac  (ACULAR ) 0.5 % ophthalmic solution Place 1 drop into the right eye 2 (two) times daily. 08/02/20   [provider]  Lancets (ONETOUCH DELICA PLUS LANCET33G) MISC CHECK BLOOD SUGAR BEFORE BREAKFAST AND DINNER 09/12/20   Pura Brittle, NP  loperamide  (IMODIUM ) 2 MG capsule Take 1 capsule (2 mg total) by mouth 4 (four) times daily as needed for diarrhea or loose stools. Patient not taking: Reported on 05/06/2023 11/07/22   Theadore Ozell HERO, MD  montelukast  (SINGULAIR ) 10 MG tablet TAKE 1 TABLET BY MOUTH EVERY DAY 02/17/22   [name], MD  Nebulizers (COMPRESSOR/NEBULIZER) MISC Use as directed Patient not taking: Reported on 05/06/2023 04/22/18   Neysa Reggy BIRCH, MD  nitroGLYCERIN  (NITROSTAT ) 0.4 MG SL tablet Place 1 tablet (0.4 mg total) under the tongue every 5 (five) minutes as needed for chest pain. 07/25/19 09/21/23  [name], MD  ondansetron  (ZOFRAN -ODT) 4 MG disintegrating tablet Take 1 tablet (4 mg total) by mouth every 8 (eight) hours as needed for nausea or vomiting. 12/06/22   Rai, Ripudeep MARLA, MD  pantoprazole  (PROTONIX ) 40 MG tablet Take 1 tablet (40 mg total) by mouth daily. 06/19/23   Raford Riggs, MD  Polyvinyl Alcohol -Povidone PF (REFRESH) 1.4-0.6 % SOLN Place 1-2 drops into both eyes 3 (three) times daily as needed (dry/irritated eyes.).    [provider]  prednisoLONE acetate (PRED FORTE) 1 % ophthalmic suspension Place 1 drop into both eyes in the morning, at noon, and at bedtime. Place 2 Drops in Left Eye, Place 3  Drops In Right Eye TID 10/11/20   [provider]  predniSONE  (DELTASONE ) 10 MG tablet Please take prednisone  40 mg x1 day, then 30 mg x1 day, then 20 mg x1 day, then 10 mg x1 day, and then 5 mg x1 day and stop 07/31/23   Geronimo Amel, MD  pregabalin  (LYRICA ) 75 MG capsule TAKE 1 CAPSULE(75 MG) BY MOUTH TWICE DAILY 02/12/23   [name], MD  RHOPRESSA  0.02 % SOLN Place 1 drop into the left eye at bedtime. 05/27/21   [provider]  tiZANidine  (ZANAFLEX ) 4 MG tablet Take 0.5 tablets (2 mg total) by mouth every 6 (six) hours as needed for muscle spasms. Patient not taking: Reported on 05/06/2023 01/01/23   Darral Longs, MD    Allergies: Crestor [rosuvastatin calcium ], Demerol  [meperidine hcl], and Shellfish allergy  Review of Systems  All other systems reviewed and are negative.   Updated Vital Signs BP (!) 172/84 (BP Location: Right Arm)   Pulse (!) 111   Temp 99.2 F (37.3 C) (Oral)   Resp 16   SpO2 100%   Physical Exam Vitals and nursing note reviewed.  Constitutional:      Appearance: She is well-developed.  HENT:     Head: Atraumatic.   Eyes:     Extraocular Movements: Extraocular movements intact.     Pupils: Pupils are equal, round, and reactive to light.    Cardiovascular:     Rate and Rhythm: Tachycardia present.  Pulmonary:     Effort: Pulmonary effort is normal.   Musculoskeletal:     Cervical back: Normal range of motion and neck supple.   Skin:    General: Skin is warm and dry.   Neurological:     Mental Status: She is alert and oriented to person, place, and time.     Cranial Nerves: No cranial nerve deficit.     Sensory: No sensory deficit.     Motor: No weakness.     Coordination: Coordination normal.     Comments: NIH stroke scale is 0    (all labs ordered are listed, but only abnormal results are displayed) Labs Reviewed  COMPREHENSIVE METABOLIC PANEL WITH GFR - Abnormal; Notable for the following components:       Result Value   Glucose, Bld 147 (*)    All other components within normal limits  CBC WITH DIFFERENTIAL/PLATELET - Abnormal; Notable for the following components:   Lymphs Abs 0.4 (*)    Monocytes Absolute 1.1 (*)    Abs Immature Granulocytes 0.16 (*)    All other components within normal limits  URINALYSIS, W/ REFLEX TO CULTURE (INFECTION SUSPECTED) - Abnormal; Notable for the following components:   APPearance HAZY (*)    Hgb urine dipstick SMALL (*)    Leukocytes,Ua LARGE (*)    Bacteria, UA RARE (*)    All other components within normal limits  CULTURE, BLOOD (ROUTINE X 2)  CULTURE, BLOOD (ROUTINE X 2)  URINE CULTURE  CK  I-STAT CG4 LACTIC ACID, ED  I-STAT CG4 LACTIC ACID, ED  TROPONIN I (HIGH SENSITIVITY)  TROPONIN I (HIGH SENSITIVITY)    EKG: None  Radiology: DG Chest Port 1 View Result Date: 08/09/2023 CLINICAL DATA:  [id number] Suspected sepsis [id number] EXAM: PORTABLE CHEST - 1 VIEW COMPARISON:  07/21/2023 FINDINGS: Lower lung volumes with some increased prominence of bibasilar interstitial markings, no confluent airspace disease. Heart size and mediastinal contours are within normal limits. Aortic Atherosclerosis (ICD10-170.0). Mild blunting of lateral costophrenic angles. Visualized bones unremarkable. IMPRESSION: Lower lung volumes with increased bibasilar interstitial markings. Electronically Signed   By: JONETTA Faes M.D.   On: 08/09/2023 10:26   CT Head Wo Contrast Result Date: 08/09/2023 CLINICAL DATA:  Patient found on floor by family. Possible fall. Nausea and vomiting last night EXAM: CT HEAD WITHOUT CONTRAST CT CERVICAL SPINE WITHOUT CONTRAST TECHNIQUE: Multidetector CT imaging of the head and cervical spine was performed following the standard protocol without intravenous contrast. Multiplanar CT image reconstructions of the cervical spine were also generated. RADIATION DOSE REDUCTION: This exam was performed according to the departmental dose-optimization program which  includes automated exposure control, adjustment of the mA and/or kV according to patient size and/or use of iterative reconstruction technique. COMPARISON:  CT head and cervical spine 12/02/2022 FINDINGS: CT HEAD FINDINGS Brain: Ventricles, cisterns and other  CSF spaces are normal. 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 No mass, mass effect, shift of midline structures or acute hemorrhage. No definite acute infarction. Vascular: No hyperdense vessel or unexpected calcification. Skull: Normal. Negative for fracture or focal lesion. Sinuses/Orbits: Orbits are normal and symmetric. Mild opacification over the ethmoid air cells as remaining paranasal sinuses are clear. Mastoid air cells are clear. Other: None. CT CERVICAL SPINE FINDINGS Alignment: Normal. Skull base and vertebrae: Vertebral body heights are normal. There is mild spondylosis of the cervical spine with mild uncovertebral joint spurring and facet arthropathy. No acute fracture or subluxation. Atlantoaxial articulation is unremarkable. Soft tissues and spinal canal: No prevertebral fluid or swelling. No visible canal hematoma. Disc levels: No significant disc space narrowing. No obvious traumatic disc herniation. Upper chest: Stable focal opacification over the right apex previously evaluated by chest CT 01/23/2023. Other: None. IMPRESSION: 1. No acute intracranial findings. 2. 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 3. No acute findings in the cervical spine. Mild spondylosis of the cervical spine. 4. Stable chronic focal opacification over the right apex previously evaluated by chest CT 01/23/2023. Electronically Signed   By: Toribio Agreste M.D.   On: 08/09/2023 10:22   CT Cervical Spine Wo Contrast Result Date: 08/09/2023 CLINICAL DATA:  Patient found on floor by family. Possible fall. Nausea and  vomiting last night EXAM: CT HEAD WITHOUT CONTRAST CT CERVICAL SPINE WITHOUT CONTRAST TECHNIQUE: Multidetector CT imaging of the head and cervical spine was performed following the standard protocol without intravenous contrast. Multiplanar CT image reconstructions of the cervical spine were also generated. RADIATION DOSE REDUCTION: This exam was performed according to the departmental dose-optimization program which includes automated exposure control, adjustment of the mA and/or kV according to patient size and/or use of iterative reconstruction technique. COMPARISON:  CT head and cervical spine 12/02/2022 FINDINGS: CT HEAD FINDINGS Brain: Ventricles, cisterns and other CSF spaces are normal. 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 No mass, mass effect, shift of midline structures or acute hemorrhage. No definite acute infarction. Vascular: No hyperdense vessel or unexpected calcification. Skull: Normal. Negative for fracture or focal lesion. Sinuses/Orbits: Orbits are normal and symmetric. Mild opacification over the ethmoid air cells as remaining paranasal sinuses are clear. Mastoid air cells are clear. Other: None. CT CERVICAL SPINE FINDINGS Alignment: Normal. Skull base and vertebrae: Vertebral body heights are normal. There is mild spondylosis of the cervical spine with mild uncovertebral joint spurring and facet arthropathy. No acute fracture or subluxation. Atlantoaxial articulation is unremarkable. Soft tissues and spinal canal: No prevertebral fluid or swelling. No visible canal hematoma. Disc levels: No significant disc space narrowing. No obvious traumatic disc herniation. Upper chest: Stable focal opacification over the right apex previously evaluated by chest CT 01/23/2023. Other: None. IMPRESSION: 1. No acute intracranial findings. 2. Mild chronic ischemic microvascular disease with subacute to chronic lacunar infarct over the  right caudate nucleus. 3. No acute findings in the cervical spine. Mild spondylosis of the cervical spine. 4. Stable chronic focal opacification over the right apex previously evaluated by chest CT 01/23/2023. Electronically Signed   By: Toribio Agreste M.D.   On: 08/09/2023 10:22     Procedures   Medications Ordered in the ED  cefTRIAXone  (ROCEPHIN ) 1 g in sodium chloride  0.9 % 100 mL IVPB (has no administration in time range)  doxycycline  (  VIBRA -TABS) tablet 100 mg (has no administration in time range)  metoCLOPramide  (REGLAN ) injection 10 mg (has no administration in time range)  acetaminophen  (TYLENOL ) tablet 650 mg (has no administration in time range)                                    Medical Decision Making Amount and/or Complexity of Data Reviewed Labs: ordered. Radiology: ordered.  Risk OTC drugs. Prescription drug management. Decision regarding hospitalization.   78 year old patient with pertinent past medical history of sarcoidosis, diabetes, previous history of strokes comes in with chief complaint of acute altered mental status, weakness, fall.  Collateral history provided by patient's husband was at the bedside.  I also reviewed patient's records including discharge summary, previous ED visits and MRI of the brain.  I have reviewed patient's previous records including the medications.  Differential diagnosis considered for this patient includes: ICH / Stroke, acute coronary syndrome, Infection - UTI/Pneumonia/soft tissue infection leading to encephalopathy, encephalopathy due to electrolyte abnormality or drug interactions or toxins or metabolic conditions like adrenal insufficiency, hyperglycemia, paraneoplastic process  Based on initial assessment, high suspicion for questionable sepsis, dehydration, stroke.  Plan is to get basic labs and CT scan of the brain, CT cervical spine.  Reassessment: I have independently reviewed the following labs: Patient's UA has  leukocytes positive urine with pyuria, bacteriuria. Clinically patient has UTI.  Urine culture sent.  I have independently reviewed the following imaging: X-ray of the chest which revealed bibasilar infiltrates.  Patient has a cough, will add doxycycline  and cover for CAP.  Patient was also reassessed at 1 PM Plan is to admit her.  Based on repeat assessment, x-ray of the wrist has been ordered.  Headache medications given.  Clinically patient is not septic. Final diagnoses:  Acute cystitis without hematuria  Disorientation  Community acquired pneumonia, unspecified laterality    ED Discharge Orders     None          Charlyn Sora, MD 08/09/23 1300

## 2023-08-09 NOTE — H&P (Signed)
 History and Physical    PatientBETHA NACHELLE Reynolds FMW:[id number] DOB: [birth year] DOA: 08/09/2023 DOS: the patient was seen and examined on 08/09/2023 . PCP: [name], MD  Patient coming from: Home Chief complaint: Chief Complaint  Patient presents with   possible sepsis  HPI:  Sheila Reynolds is a 78 y.o. female with past medical history  of allergies to Crestor Demerol shellfish although patient has had Angio contrast studies as recently as May 2025 and CT angio chest PE protocol today presents today from home after a fall.  Per spouse at bedside patient has been having diarrhea for the past few weeks off-and-on and also had been having nausea and vomiting for which she got up around 2- 3 AM and was found on the floor in the bathroom at around 08/16/1928.  Patient was hot to touch was noted to be tachycardic tachypneic and EMS was called patient was unaware of what happened was somewhat confused disoriented.  Also reports cough for about 3 weeks.  Initial heart rate noted to be 120 sinus tach and respiratory rate of 30.  Patient had gone to a birthday party yesterday returned home around 8:00 went to bed and when the husband went to bed last night at 130 she did report to him that she was having feeling cold and was having nausea.  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  Family at bedside spouse at bedside, reports that at baseline patient needs assistance with ambulation is able to dress herself and ambulates with a walker but does have limited mobility and deconditioning. Chart reviewed patient was seen on 26 May for cough congestion sinus bronchitis was told that she has a bacterial sinus infection and was given Augmentin  then on 26 May, patient was then seen on 10 June for similar unresolved symptoms and was given doxycycline  at that time for 7 days along with  over-the-counter  remedies.  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  Patient had a CT angio which showed a spiculated intracavitary nodule in the right upper lobe 1.4.2 0.4 cm.  Patient's last known normal was 8 PM and she was found today at 7 AM-7:30 AM.  ED Course: Pt in ed at bedside  is awake alert oriented cooperative. Vital signs in the ED were notable for the following:  Vitals:   08/09/23 1826 08/09/23 1830 08/09/23 1845 08/09/23 1900  BP:  127/74 125/70 125/77  Pulse: 90 93 95 90  Temp: 98.5 F (36.9 C)     Resp: 17 16 16 15   SpO2: 98% 97% 96% 96%  TempSrc:      >>ED evaluation thus far shows: Reviewed CMP shows total bili of 1.5 glucose 133. Troponin flat at 7 CPK 100. EKG shows sinus tach at 115 PR of 128, QRS of 76, LVH, QTc of 426, nonspecific ST changes. Lactic acid 1.6 repeat at 1.1. CBC within normal limits. D-dimer elevated at 3.74. Urinalysis today shows large leukocytes and 21-50 WBCs and 0-5 RBCs.  >>While in the ED patient received the following: Medications  cefTRIAXone  (ROCEPHIN ) 1 g in sodium chloride  0.9 % 100 mL IVPB (has no administration in time range)  doxycycline  (VIBRA -TABS) tablet 100 mg (has no administration in time range)  metoCLOPramide  (REGLAN ) injection 10 mg (has no administration in time range)  acetaminophen  (TYLENOL ) tablet 650 mg (has no administration in time range)   Review of Systems  Neurological:  Positive for loss of consciousness and weakness.   Past Medical History:  Diagnosis Date   Anemia    3 months ago anemic   Anxiety    on meds   Arthritis    all over (01/15/2017)   Asthma    Bronchitis with emphysema    Chest pain    Chronic bronchitis (HCC)    Coronary artery disease    a. 01/2017 she underwent orbital atherectomy/DES to the proxmal LAD and PTCA to ostial D2. 2D Echo 01/15/17 showed mild LVH, EF 60-65%, grade 1 DD.   Family history of anesthesia complication    daughter N/V   Fibromyalgia    GERD (gastroesophageal reflux  disease)    on meds   Glaucoma    Heart murmur    History of hiatal hernia    Hx of echocardiogram    Echo (03/2013):  Tech limited; Mild focal basal septal hypertrophy, EF 60-65%, normal RVF   Hyperlipidemia    Hypertension    Nonsustained ventricular tachycardia (HCC)    OSA on CPAP    Premature atrial contractions    PVC (premature ventricular contraction)    a. Holter 12/16: NSR, occ PAC,PVCs   Sarcoidosis    Type II diabetes mellitus (HCC)    Past Surgical History:  Procedure Laterality Date   BREAST SURGERY     CARDIAC CATHETERIZATION  05/04/2007   reveals overall normal left ventricular systolic function. Ejection fraction 65-70%   CATARACT EXTRACTION W/PHACO Right 07/20/2013   Procedure: CATARACT EXTRACTION PHACO AND INTRAOCULAR LENS PLACEMENT (IOC);  Surgeon: Gaither Quan, MD;  Location: [facility] OR;  Service: Ophthalmology;  Laterality: Right;   COLONOSCOPY W/ BIOPSIES AND POLYPECTOMY     COLONOSCOPY WITH PROPOFOL  N/A 09/07/2014   Procedure: COLONOSCOPY WITH PROPOFOL ;  Surgeon: Renaye Sous, MD;  Location: [facility];  Service: Endoscopy;  Laterality: N/A;   COLONOSCOPY WITH PROPOFOL  N/A 01/10/2020   Procedure: COLONOSCOPY WITH PROPOFOL ;  Surgeon: Sous Renaye, MD;  Location: [facility];  Service: Endoscopy;  Laterality: N/A;   CORONARY ANGIOPLASTY WITH STENT PLACEMENT  01/15/2017   CORONARY ATHERECTOMY N/A 01/15/2017   Procedure: CORONARY ATHERECTOMY;  Surgeon: Mady Bruckner, MD;  Location: [facility];  Service: Cardiovascular;  Laterality: N/A;   CORONARY BALLOON ANGIOPLASTY N/A 01/15/2017   Procedure: CORONARY BALLOON ANGIOPLASTY;  Surgeon: Mady Bruckner, MD;  Location: [facility];  Service: Cardiovascular;  Laterality: N/A;   CORONARY PRESSURE/FFR STUDY N/A 12/12/2016   Procedure: INTRAVASCULAR PRESSURE WIRE/FFR STUDY;  Surgeon: Mady Bruckner, MD;  Location: [facility];  Service: Cardiovascular;  Laterality: N/A;   CORONARY STENT INTERVENTION  N/A 01/15/2017   Procedure: CORONARY STENT INTERVENTION;  Surgeon: Mady Bruckner, MD;  Location: [facility];  Service: Cardiovascular;  Laterality: N/A;   ESOPHAGOGASTRODUODENOSCOPY (EGD) WITH PROPOFOL  N/A 09/07/2014   Procedure: ESOPHAGOGASTRODUODENOSCOPY (EGD) WITH PROPOFOL ;  Surgeon: Renaye Sous, MD;  Location: [facility];  Service: Endoscopy;  Laterality: N/A;   EXTERNAL EAR SURGERY Bilateral 1970s   tumors removed   EYE SURGERY Left 2019   cataract extraction    EYE SURGERY Right 02/09/2018   eyelid surgery    MINI SHUNT INSERTION Right 07/20/2013   Procedure: INSERTION OF GLAUCOMA FILTRATION DEVICE RIGHT EYE;  Surgeon: Gaither Quan, MD;  Location: [facility] OR;  Service: Ophthalmology;  Laterality: Right;   MITOMYCIN  C APPLICATION Right 07/20/2013   Procedure: MITOMYCIN  C APPLICATION;  Surgeon: Gaither Quan, MD;  Location: [facility] OR;  Service: Ophthalmology;  Laterality: Right;   MITOMYCIN  C APPLICATION Right 02/21/2015   Procedure: MITOMYCIN  C APPLICATION RIGHT EYE;  Surgeon: Gaither Quan, MD;  Location: [facility] OR;  Service: Ophthalmology;  Laterality: Right;   PLACEMENT OF BREAST IMPLANTS Bilateral 1992   took all my breast tissue out; put implants in;fibrocystic breast disease    POLYPECTOMY  01/10/2020   Procedure: POLYPECTOMY;  Surgeon: Kristie Lamprey, MD;  Location: [facility];  Service: Endoscopy;;   RIGHT/LEFT HEART CATH AND CORONARY ANGIOGRAPHY N/A 12/12/2016   Procedure: RIGHT/LEFT HEART CATH AND CORONARY ANGIOGRAPHY;  Surgeon: Mady Bruckner, MD;  Location: [facility];  Service: Cardiovascular;  Laterality: N/A;   TONSILLECTOMY     TOTAL ABDOMINAL HYSTERECTOMY  1982   TRABECULECTOMY Right 02/21/2015   Procedure: TRABECULECTOMY WITH [facility] ON THE RIGHT EYE;  Surgeon: Gaither Quan, MD;  Location: [facility] OR;  Service: Ophthalmology;  Laterality: Right;    reports that she quit smoking about 43 years ago. Her smoking use included cigarettes. She started smoking about 47 years ago.  She has a 4 pack-year smoking history. She has never used smokeless tobacco. She reports that she does not drink alcohol  and does not use drugs. Allergies  Allergen Reactions   Crestor [Rosuvastatin Calcium ] Other (See Comments)    muscle aches   Demerol  [Meperidine Hcl]     Other reaction(s): Hallucinations   Shellfish Allergy Itching and Other (See Comments)    Crab, shrimp and lobster ---lips itch and tingle Was told not to eat again after having a allergy test. Lobster, crab and shrimp    Family History  Problem Relation Age of Onset   Heart disease Father    Diabetes Father    Glaucoma Father    Cancer Mother        unknown type, ?lung   Heart disease Paternal Grandmother    Cancer Paternal Grandmother        unknown type   Diabetes Brother    Glaucoma Brother    Prior to Admission medications   Medication Sig Start Date End Date Taking? Authorizing Provider  acetaminophen  (TYLENOL ) 500 MG tablet Take 1,000 mg by mouth 2 (two) times daily as needed for moderate pain or headache.   Yes [provider]  albuterol  (PROVENTIL ) (2.5 MG/3ML) 0.083% nebulizer solution Take 3 mLs (2.5 mg total) by nebulization daily as needed for wheezing or shortness of breath. 04/01/21  Yes Georgina Speaks, FNP  albuterol  (VENTOLIN  HFA) 108 (90 Base) MCG/ACT inhaler Inhale 2 puffs into the lungs every 6 (six) hours as needed for wheezing or shortness of breath. 12/30/19  Yes [name], MD  aspirin  EC 81 MG tablet Take 81 mg by mouth daily. Swallow whole.   Yes [provider]  atorvastatin  (LIPITOR) 80 MG tablet Take 1 tablet (80 mg total) by mouth daily. 02/24/23  Yes [name], NP  carvedilol  (COREG ) 12.5 MG tablet Take 1 tablet (12.5 mg total) by mouth 2 (two) times daily with a meal. 06/19/23  Yes Raford Riggs, MD  EPINEPHrine  0.3 mg/0.3 mL IJ SOAJ injection Inject 0.3 mg into the muscle as needed for anaphylaxis. 05/06/23  Yes [name], MD  ezetimibe  (ZETIA )  10 MG tablet Take 1 tablet (10 mg total) by mouth daily. 01/01/23  Yes [name], [name], MD  furosemide  (LASIX ) 40 MG tablet Take 1 tablet (40 mg total) by mouth daily as needed. 03/28/22  Yes [name], [name], MD  isosorbide  mononitrate (IMDUR ) 60 MG 24 hr tablet  TAKE 1 TABLET(60 MG) BY MOUTH DAILY 06/19/23  Yes Raford Riggs, MD  ketorolac  (ACULAR ) 0.5 % ophthalmic solution Place 1 drop into the right eye 2 (two) times daily. 08/02/20  Yes [provider]  montelukast  (SINGULAIR ) 10 MG tablet TAKE 1 TABLET BY MOUTH EVERY DAY 02/17/22  Yes [name], MD  nitroGLYCERIN  (NITROSTAT ) 0.4 MG SL tablet Place 1 tablet (0.4 mg total) under the tongue every 5 (five) minutes as needed for chest pain. 07/25/19 09/21/23 Yes [name], MD  pantoprazole  (PROTONIX ) 40 MG tablet Take 1 tablet (40 mg total) by mouth daily. 06/19/23  Yes Raford Riggs, MD  prednisoLONE acetate (PRED FORTE) 1 % ophthalmic suspension Place 1 drop into both eyes in the morning, at noon, and at bedtime. Place 2 Drops in Left Eye, Place 3 Drops In Right Eye TID 10/11/20  Yes [provider]  pregabalin  (LYRICA ) 75 MG capsule TAKE 1 CAPSULE(75 MG) BY MOUTH TWICE DAILY 02/12/23  Yes [name], MD  ANORO ELLIPTA  62.5-25 MCG/ACT AEPB INHALE 1 PUFF INTO THE LUNGS DAILY AT 6 AM Patient not taking: No sig reported 02/21/22   [name], Praveen, MD  Azathioprine 75 MG TABS Take 1 tablet by mouth daily.    [provider]  Blood Glucose Monitoring Suppl (ACCU-CHEK AVIVA PLUS) w/Device KIT Use to check blood sugars 3 times a day. Dx code e11.65 01/03/20   [name], MD  Blood Glucose Monitoring Suppl (ACCU-CHEK GUIDE) w/Device KIT USE AS NEEDED TO CHECK BLOOD SUGARS. 04/16/23   [name], MD  brimonidine  (ALPHAGAN  P) 0.1 % SOLN Place 1 drop into both eyes in the morning, at noon, and at bedtime.    [provider]  brinzolamide  (AZOPT ) 1 % ophthalmic suspension Place 1 drop into both eyes 3 (three)  times daily.    [provider]  cetirizine  (ZYRTEC  ALLERGY) 10 MG tablet Take 1 tablet (10 mg total) by mouth daily. Patient not taking: Reported on 06/09/2023 05/06/23 05/05/24  [name], MD  JANUMET  50-500 MG tablet TAKE 1 TABLET BY MOUTH TWICE DAILY WITH A MEAL 06/09/23   [name], MD  Lancets [facility] DELICA PLUS [place]) MISC CHECK BLOOD SUGAR BEFORE BREAKFAST AND DINNER 09/12/20   Pura Brittle, NP  Polyvinyl Alcohol -Povidone PF (REFRESH) 1.4-0.6 % SOLN Place 1-2 drops into both eyes 3 (three) times daily as needed (dry/irritated eyes.).    [provider]  predniSONE  (DELTASONE ) 10 MG tablet Please take prednisone  40 mg x1 day, then 30 mg x1 day, then 20 mg x1 day, then 10 mg x1 day, and then 5 mg x1 day and stop 07/31/23   Geronimo Amel, MD                                                                                 Vitals:   08/09/23 1826 08/09/23 1830 08/09/23 1845 08/09/23 1900  BP:  127/74 125/70 125/77  Pulse: 90 93 95 90  Resp: 17 16 16 15   Temp: 98.5 F (36.9 C)     TempSrc:      SpO2: 98% 97% 96% 96%   Physical Exam Vitals and nursing note reviewed.  Constitutional:  General: She is not in acute distress. HENT:     Head: Normocephalic and atraumatic.     Right Ear: Hearing and external ear normal.     Left Ear: Hearing and external ear normal.     Nose: No nasal deformity.     Mouth/Throat:     Lips: Pink.     Mouth: Mucous membranes are dry.   Eyes:     General: Lids are normal.     Extraocular Movements: Extraocular movements intact.     Pupils: Pupils are equal, round, and reactive to light.    Cardiovascular:     Rate and Rhythm: Normal rate and regular rhythm.     Heart sounds: Normal heart sounds.  Pulmonary:     Effort: Pulmonary effort is normal.     Breath sounds: Normal breath sounds.  Abdominal:     General: Bowel sounds are normal. There is no distension.     Palpations: Abdomen is soft. There is no  mass.     Tenderness: There is no abdominal tenderness.   Musculoskeletal:     Right lower leg: No edema.     Left lower leg: No edema.   Skin:    General: Skin is warm.   Neurological:     General: No focal deficit present.     Mental Status: She is alert and oriented to person, place, and time.     Cranial Nerves: Cranial nerves 2-12 are intact. No cranial nerve deficit, dysarthria or facial asymmetry.     Sensory: No sensory deficit.     Motor: Weakness present. No tremor or pronator drift.     Coordination: Coordination normal. Finger-Nose-Finger Test normal.     Deep Tendon Reflexes: Reflexes abnormal.     Reflex Scores:      Bicep reflexes are 1+ on the right side and 1+ on the left side.      Patellar reflexes are 1+ on the right side and 1+ on the left side.    Comments: Right upper extremity weakness patient has difficulty with abducting right upper extremity.  Psychiatric:        Mood and Affect: Mood normal.        Speech: Speech normal.     Labs on Admission: I have personally reviewed following labs and imaging studies CBC: Recent Labs  Lab 08/09/23 0812  WBC 7.9  NEUTROABS 6.0  HGB 13.7  HCT 42.3  MCV 86.9  PLT 199   Basic Metabolic Panel: Recent Labs  Lab 08/09/23 0812 08/09/23 1304  NA 135 136  K 3.8 4.1  CL 100 100  CO2 22 23  GLUCOSE 147* 133*  BUN 16 15  CREATININE 0.81 0.77  CALCIUM  9.3 8.9   GFR: Estimated Creatinine Clearance: 57 mL/min (by C-G formula based on SCr of 0.77 mg/dL). Liver Function Tests: Recent Labs  Lab 08/09/23 0812 08/09/23 1304  AST 22 34  ALT 20 24  ALKPHOS 68 65  BILITOT 0.9 1.5*  PROT 7.3 6.5  ALBUMIN 3.9 3.5   No results for input(s): LIPASE, AMYLASE in the last 168 hours. No results for input(s): AMMONIA in the last 168 hours. Coagulation Profile: No results for input(s): INR, PROTIME in the last 168 hours. Cardiac Enzymes: Recent Labs  Lab 08/09/23 0812 08/09/23 1304  CKTOTAL 100 158    BNP (last 3 results) No results for input(s): PROBNP in the last 8760 hours. HbA1C: No results for input(s): HGBA1C in the last 72 hours. CBG:  Recent Labs  Lab 08/09/23 1836  GLUCAP 120*   Lipid Profile: No results for input(s): CHOL, HDL, LDLCALC, TRIG, CHOLHDL, LDLDIRECT in the last 72 hours. Thyroid  Function Tests: No results for input(s): TSH, T4TOTAL, FREET4, T3FREE, THYROIDAB in the last 72 hours. Anemia Panel: No results for input(s): VITAMINB12, FOLATE, FERRITIN, TIBC, IRON, RETICCTPCT in the last 72 hours. Urine analysis:    Component Value Date/Time   COLORURINE YELLOW 08/09/2023 0812   APPEARANCEUR HAZY (A) 08/09/2023 0812   LABSPEC 1.013 08/09/2023 0812   PHURINE 6.0 08/09/2023 0812   GLUCOSEU NEGATIVE 08/09/2023 0812   HGBUR SMALL (A) 08/09/2023 0812   BILIRUBINUR NEGATIVE 08/09/2023 0812   BILIRUBINUR SMALL 04/15/2023 1738   KETONESUR NEGATIVE 08/09/2023 0812   PROTEINUR NEGATIVE 08/09/2023 0812   UROBILINOGEN 1.0 04/15/2023 1738   UROBILINOGEN 1.0 02/09/2011 1906   NITRITE NEGATIVE 08/09/2023 0812   LEUKOCYTESUR LARGE (A) 08/09/2023 0812   Radiological Exams on Admission: DG Forearm Left Result Date: 08/09/2023 CLINICAL DATA:  fall EXAM: LEFT FOREARM - 2 VIEW COMPARISON:  None Available. FINDINGS: No acute fracture or dislocation. There is no evidence of arthropathy or other focal bone abnormality. Soft tissues are unremarkable. IMPRESSION: No acute fracture or dislocation. Electronically Signed   By: Rogelia Myers M.D.   On: 08/09/2023 14:49   DG Chest Port 1 View Result Date: 08/09/2023 CLINICAL DATA:  [id number] Suspected sepsis [id number] EXAM: PORTABLE CHEST - 1 VIEW COMPARISON:  07/21/2023 FINDINGS: Lower lung volumes with some increased prominence of bibasilar interstitial markings, no confluent airspace disease. Heart size and mediastinal contours are within normal limits. Aortic Atherosclerosis (ICD10-170.0). Mild  blunting of lateral costophrenic angles. Visualized bones unremarkable. IMPRESSION: Lower lung volumes with increased bibasilar interstitial markings. Electronically Signed   By: JONETTA Faes M.D.   On: 08/09/2023 10:26   CT Head Wo Contrast Result Date: 08/09/2023 CLINICAL DATA:  Patient found on floor by family. Possible fall. Nausea and vomiting last night EXAM: CT HEAD WITHOUT CONTRAST CT CERVICAL SPINE WITHOUT CONTRAST TECHNIQUE: Multidetector CT imaging of the head and cervical spine was performed following the standard protocol without intravenous contrast. Multiplanar CT image reconstructions of the cervical spine were also generated. RADIATION DOSE REDUCTION: This exam was performed according to the departmental dose-optimization program which includes automated exposure control, adjustment of the mA and/or kV according to patient size and/or use of iterative reconstruction technique. COMPARISON:  CT head and cervical spine 12/02/2022 FINDINGS: CT HEAD FINDINGS Brain: Ventricles, cisterns and other CSF spaces are normal. 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 No mass, mass effect, shift of midline structures or acute hemorrhage. No definite acute infarction. Vascular: No hyperdense vessel or unexpected calcification. Skull: Normal. Negative for fracture or focal lesion. Sinuses/Orbits: Orbits are normal and symmetric. Mild opacification over the ethmoid air cells as remaining paranasal sinuses are clear. Mastoid air cells are clear. Other: None. CT CERVICAL SPINE FINDINGS Alignment: Normal. Skull base and vertebrae: Vertebral body heights are normal. There is mild spondylosis of the cervical spine with mild uncovertebral joint spurring and facet arthropathy. No acute fracture or subluxation. Atlantoaxial articulation is unremarkable. Soft tissues and spinal canal: No prevertebral fluid or swelling. No visible canal hematoma. Disc  levels: No significant disc space narrowing. No obvious traumatic disc herniation. Upper chest: Stable focal opacification over the right apex previously evaluated by chest CT 01/23/2023. Other: None. IMPRESSION: 1. No acute intracranial findings. 2. Mild chronic ischemic microvascular disease with subacute  to chronic lacunar infarct over the right caudate nucleus. 3. No acute findings in the cervical spine. Mild spondylosis of the cervical spine. 4. Stable chronic focal opacification over the right apex previously evaluated by chest CT 01/23/2023. Electronically Signed   By: Toribio Agreste M.D.   On: 08/09/2023 10:22   CT Cervical Spine Wo Contrast Result Date: 08/09/2023 CLINICAL DATA:  Patient found on floor by family. Possible fall. Nausea and vomiting last night EXAM: CT HEAD WITHOUT CONTRAST CT CERVICAL SPINE WITHOUT CONTRAST TECHNIQUE: Multidetector CT imaging of the head and cervical spine was performed following the standard protocol without intravenous contrast. Multiplanar CT image reconstructions of the cervical spine were also generated. RADIATION DOSE REDUCTION: This exam was performed according to the departmental dose-optimization program which includes automated exposure control, adjustment of the mA and/or kV according to patient size and/or use of iterative reconstruction technique. COMPARISON:  CT head and cervical spine 12/02/2022 FINDINGS: CT HEAD FINDINGS Brain: Ventricles, cisterns and other CSF spaces are normal. 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 No mass, mass effect, shift of midline structures or acute hemorrhage. No definite acute infarction. Vascular: No hyperdense vessel or unexpected calcification. Skull: Normal. Negative for fracture or focal lesion. Sinuses/Orbits: Orbits are normal and symmetric. Mild opacification over the ethmoid air cells as remaining paranasal sinuses are clear. Mastoid air cells  are clear. Other: None. CT CERVICAL SPINE FINDINGS Alignment: Normal. Skull base and vertebrae: Vertebral body heights are normal. There is mild spondylosis of the cervical spine with mild uncovertebral joint spurring and facet arthropathy. No acute fracture or subluxation. Atlantoaxial articulation is unremarkable. Soft tissues and spinal canal: No prevertebral fluid or swelling. No visible canal hematoma. Disc levels: No significant disc space narrowing. No obvious traumatic disc herniation. Upper chest: Stable focal opacification over the right apex previously evaluated by chest CT 01/23/2023. Other: None. IMPRESSION: 1. No acute intracranial findings. 2. 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 3. No acute findings in the cervical spine. Mild spondylosis of the cervical spine. 4. Stable chronic focal opacification over the right apex previously evaluated by chest CT 01/23/2023. Electronically Signed   By: Toribio Agreste M.D.   On: 08/09/2023 10:22   Data Reviewed: Relevant notes from primary care and specialist visits, past discharge summaries as available in EHR, including Care Everywhere . Prior diagnostic testing as pertinent to current admission diagnoses, Updated medications and problem lists for reconciliation .ED course, including vitals, labs, imaging, treatment and response to treatment,Triage notes, nursing and pharmacy notes and ED provider's notes.Notable results as noted in HPI.Discussed case with EDMD/ ED APP/ or Specialty MD on call and as needed.  Assessment & Plan  >>Syncope collapse: Suspect 2/2 to cva/ tia. Fall  and aspiration precaution.  - CT head shows no acute cva but rather subacute to chronic infarct.  - MRI brain shows no acute infarct. - Aspirin  daily 81 mg/atorvastatin .  - Check FLP, hemoglobin A1c- - Check 2 D Echocardiogram and carotid dopplers. - RN to perform stroke swallowing screen and if  patient passes, place diet order. - Neuro checks Q 2 hours x 12 hours, then Q 4 hours. - PT/OT evaluations  >>Hypertension - Will continue patient on Coreg  and Imdur  and as needed hydralazine .  >> UTI: CT does show bladder wall thickening.  Will follow cultures -Continue patient on antibiotic regimen started in the ED with Rocephin     >>  Suspected pneumonia: Patient also started on Rocephin  and doxycycline  with suspected pneumonia.  Procalcitonin is pending.  >>Diabetes -Will get A1cAnd start patient on glycemic protocol with Accu-Cheks every 4 hours carb consistent cardiac diet if patient passes swallow.   >>Hyperlipidemia -Continue patient on atorvastatin .    >> Sarcoidosis/ocular sarcoidosis: Continue patient on azathioprine 75 mg daily. Continue patient on Alphagan  Azopt  Pred forte.  >> Abnormal CTA: Unclear if patient's previous cavitary lesion has resolved have reviewed notes we will repeat CTA to follow-up as identify any pulmonary embolism. Also to follow-up on abnormal chest x-ray showing bibasilar interstitial markings. Will defer to am team to request pulmonary consult for abnormal cavitary area that has been there similar to past cont with CAP coverage and procal is pending.     DVT prophylaxis:  Heparin  once MRI is resulted Consults:  None Advance Care Planning:    Code Status: Full Code   Family Communication:  Spouse and family and daughters Disposition Plan:  To be determined Severity of Illness: The appropriate patient status for this patient is INPATIENT. Inpatient status is judged to be reasonable and necessary in order to provide the required intensity of service to ensure the patient's safety. The patient's presenting symptoms, physical exam findings, and initial radiographic and laboratory data in the context of their chronic comorbidities is felt to place them at high risk for further clinical deterioration. Furthermore, it is not anticipated that  the patient will be medically stable for discharge from the hospital within 2 midnights of admission.   * I certify that at the point of admission it is my clinical judgment that the patient will require inpatient hospital care spanning beyond 2 midnights from the point of admission due to high intensity of service, high risk for further deterioration and high frequency of surveillance required.*  Unresulted Labs (From admission, onward)     Start     Ordered   08/10/23 0500  Lipid panel  (Labs)  Tomorrow morning,   R       Comments: Fasting    08/09/23 1443   08/09/23 1440  Urine rapid drug screen (hosp performed)not at [facility])  Once,   R        08/09/23 1443   08/09/23 1423  C Difficile Quick Screen w PCR reflex  (C Difficile quick screen w PCR reflex panel )  Once, for 24 hours,   URGENT       References:    CDiff Information Tool   08/09/23 1422   08/09/23 1400  C-reactive protein  Once,   R        08/09/23 1400   08/09/23 0812  Culture, blood (Routine x 2)  BLOOD CULTURE X 2,   R      08/09/23 0812   08/09/23 0812  Urine Culture  Once,   R        08/09/23 9187            Meds ordered this encounter  Medications   cefTRIAXone  (ROCEPHIN ) 1 g in sodium chloride  0.9 % 100 mL IVPB    Antibiotic Indication::   CAP   doxycycline  (VIBRA -TABS) tablet 100 mg   metoCLOPramide  (REGLAN ) injection 10 mg   acetaminophen  (TYLENOL ) tablet 650 mg    stroke: early stages of recovery book   OR Linked Order Group    acetaminophen  (TYLENOL ) tablet 650 mg    acetaminophen  (TYLENOL ) 160 MG/5ML solution 650 mg    acetaminophen  (TYLENOL ) suppository 650 mg  sodium chloride  flush (NS) 0.9 % injection 3-10 mL   sodium chloride  flush (NS) 0.9 % injection 3-10 mL   albuterol  (PROVENTIL ) (2.5 MG/3ML) 0.083% nebulizer solution 2.5 mg   aspirin  EC tablet 81 mg    Swallow whole.     atorvastatin  (LIPITOR) tablet 80 mg   azaTHIOprine (IMURAN) tablet 75 mg   brimonidine  (ALPHAGAN ) 0.15 %  ophthalmic solution 1 drop   brinzolamide  (AZOPT ) 1 % ophthalmic suspension 1 drop   pregabalin  (LYRICA ) capsule 75 mg    TAKE 1 CAPSULE(75 MG) BY MOUTH TWICE DAILY     prednisoLONE acetate (PRED FORTE) 1 % ophthalmic suspension 1 drop    Place 2 Drops in Left Eye, Place 3 Drops In Right Eye TID     pantoprazole  (PROTONIX ) EC tablet 40 mg   nitroGLYCERIN  (NITROSTAT ) SL tablet 0.4 mg   montelukast  (SINGULAIR ) tablet 10 mg   ketorolac  (ACULAR ) 0.5 % ophthalmic solution 1 drop   ezetimibe  (ZETIA ) tablet 10 mg   insulin  aspart (novoLOG ) injection 0-9 Units    Correction coverage::   Sensitive (thin, NPO, renal)    CBG < 70::   Implement Hypoglycemia Standing Orders and refer to Hypoglycemia Standing Orders sidebar report    CBG 70 - 120::   0 units    CBG 121 - 150::   1 unit    CBG 151 - 200::   2 units    CBG 201 - 250::   3 units    CBG 251 - 300::   5 units    CBG 301 - 350::   7 units    CBG 351 - 400:   9 units    CBG > 400:   call MD and obtain STAT lab verification   hydrALAZINE  (APRESOLINE ) injection 10 mg   iohexol  (OMNIPAQUE ) 350 MG/ML injection 100 mL   heparin  injection 5,000 Units     Orders Placed This Encounter  Procedures   Culture, blood (Routine x 2)   Urine Culture   Resp panel by RT-PCR (RSV, Flu A&B, Covid) Anterior Nasal Swab   C Difficile Quick Screen w PCR reflex   CT Head Wo Contrast   CT Cervical Spine Wo Contrast   DG Chest Port 1 View   DG Forearm Left   MR BRAIN WO CONTRAST   CT Angio Chest/Abd/Pel for Dissection W and/or W/WO   Comprehensive metabolic panel   CBC with Differential   Urinalysis, w/ Reflex to Culture (Infection Suspected) -Urine, Clean Catch   CK   CK   D-dimer, quantitative   Procalcitonin   Comprehensive metabolic panel   C-reactive protein   Urine rapid drug screen (hosp performed)not at [facility]   Lipid panel   Diet Carb Modified Fluid consistency: Thin; Room service appropriate? Yes   Notify physician (specify)  Specify:  Notify provider for possible Code Sepsis   Document height and weight   NIHSS score documentation NIHSS score range: 0-42   Vital signs   Notify physician (specify)   OOB with assistance   Activity as tolerated   Swallow screen - If patient does NOT pass this screen, place order for SLP eval and treat (SLP2) - swallowing evaluation (BSE, MBS and/or diet order as indicated)   NIH Stroke Scale   Intake and output   Cardiac Monitoring Continuous x 24 hours Indications for use: Acute neurological event   Apply Stroke Care Plan: Ischemic Stroke, TIA   Initiate Adult Central Line Maintenance and Catheter Protocol for patients  with central line (CVC, PICC, Port, Hemodialysis, Trialysis)   Discuss with patient and document patient's goals for stroke risk factor reduction   Initiate Oral Care Protocol   Initiate Carrier Fluid Protocol   Provide stroke education material to patient and family.   Nurse to provide smoking / tobacco cessation education   If the patient has passed the Stroke Swallow Screen or has a feeding tube, then RN may order General Admission PRN Orders (through manage orders) for the following patient needs: allergy symptoms (Claritin), cold sores (Carmex), cough (Robitussin DM), eye irritation (Liquifilm Tears), hemorrhoids (Tucks), indigestion (Maalox), minor skin irritation (hydrocortisone cream), muscle pain Lucienne Gay), nose irritation (saline nasal spray) and sore throat (Chloraseptic spray).   Apply Diabetes Mellitus Care Plan   STAT CBG when hypoglycemia is suspected. If treated, recheck every 15 minutes after each treatment until CBG >/= 70 mg/dl   Refer to Hypoglycemia Protocol Sidebar Report for treatment of CBG < 70 mg/dl   No HS correction Insulin    Full code   Consult to hospitalist   Consult to Registered Dietitian   Consult to Transition of Care Team   Enteric precautions (UV disinfection)   OT eval and treat   PT eval and treat   Oxygen  therapy Mode or (Route):  Nasal cannula; Liters Per Minute: 2; Keep O2 saturation between: greater than 94 %   SLP eval and treat Reason for evaluation: Cognitive/Language evaluation   I-Stat Lactic Acid, ED   CBG monitoring, ED   EKG 12-Lead   ECHOCARDIOGRAM COMPLETE   Admit to Inpatient (patient's expected length of stay will be greater than 2 midnights or inpatient only procedure)   Fall precautions   Aspiration precautions   VAS US  CAROTID (at [facility] and [facility] only)    Author: Mario LULLA Blanch, MD 12 pm- 8 pm. [facility]. 08/09/2023 9:37 PM Please note for any communication after hours contact [facility] Assigned provider on call on Amion.

## 2023-08-09 NOTE — ED Triage Notes (Addendum)
 Pt bib [facility] from home for a fever. Pt was found in floor by family. Family heard patient get up around 2-3am with nausea and vomiting and don't know how long she was in bathroom on floor. When ems arrived , pt was very hot to the touch, tachycardic, tachypnea.EMS applied c-collar due to patient unaware of events leading to being on floor.  Pt is a&x4, but reports having some brain fog.  Pt reports cough for approximately 3 weeks.    EMS VS  160/80 120 ST 30 RR 96% RA   20G LAC.  200 LR given   HX CHF  COPD, DIABETES, HTN

## 2023-08-09 NOTE — ED Notes (Signed)
 Unable to get 2nd set of BLCs

## 2023-08-09 NOTE — ED Notes (Signed)
 Patient transported to MRI

## 2023-08-10 ENCOUNTER — Inpatient Hospital Stay (HOSPITAL_COMMUNITY)

## 2023-08-10 ENCOUNTER — Encounter (HOSPITAL_COMMUNITY): Payer: Self-pay | Admitting: Internal Medicine

## 2023-08-10 DIAGNOSIS — R609 Edema, unspecified: Secondary | ICD-10-CM

## 2023-08-10 DIAGNOSIS — N3000 Acute cystitis without hematuria: Secondary | ICD-10-CM | POA: Diagnosis not present

## 2023-08-10 DIAGNOSIS — R410 Disorientation, unspecified: Secondary | ICD-10-CM

## 2023-08-10 DIAGNOSIS — R55 Syncope and collapse: Secondary | ICD-10-CM | POA: Diagnosis not present

## 2023-08-10 DIAGNOSIS — J189 Pneumonia, unspecified organism: Secondary | ICD-10-CM

## 2023-08-10 LAB — LIPID PANEL
Cholesterol: 140 mg/dL (ref 0–200)
HDL: 50 mg/dL (ref >40)
LDL Cholesterol: 78 mg/dL (ref 0–99)
Total CHOL/HDL Ratio: 2.8 ratio
Triglycerides: 62 mg/dL (ref <150)
VLDL: 12 mg/dL (ref 0–40)

## 2023-08-10 LAB — CBC
HCT: 35.8 % — ABNORMAL LOW (ref 36.0–46.0)
Hemoglobin: 11.8 g/dL — ABNORMAL LOW (ref 12.0–15.0)
MCH: 27.9 pg (ref 26.0–34.0)
MCHC: 33 g/dL (ref 30.0–36.0)
MCV: 84.6 fL (ref 80.0–100.0)
Platelets: 190 10*3/uL (ref 150–400)
RBC: 4.23 MIL/uL (ref 3.87–5.11)
RDW: 14.1 % (ref 11.5–15.5)
WBC: 7.4 10*3/uL (ref 4.0–10.5)
nRBC: 0 % (ref 0.0–0.2)

## 2023-08-10 LAB — RENAL FUNCTION PANEL
Albumin: 3.1 g/dL — ABNORMAL LOW (ref 3.5–5.0)
Anion gap: 9 (ref 5–15)
BUN: 11 mg/dL (ref 8–23)
CO2: 25 mmol/L (ref 22–32)
Calcium: 8.3 mg/dL — ABNORMAL LOW (ref 8.9–10.3)
Chloride: 100 mmol/L (ref 98–111)
Creatinine, Ser: 0.73 mg/dL (ref 0.44–1.00)
GFR, Estimated: >60 mL/min (ref >60)
Glucose, Bld: 136 mg/dL — ABNORMAL HIGH (ref 70–99)
Phosphorus: 2.7 mg/dL (ref 2.5–4.6)
Potassium: 3.4 mmol/L — ABNORMAL LOW (ref 3.5–5.1)
Sodium: 134 mmol/L — ABNORMAL LOW (ref 135–145)

## 2023-08-10 LAB — HEMOGLOBIN A1C
Hgb A1c MFr Bld: 6.2 % — ABNORMAL HIGH (ref 4.8–5.6)
Mean Plasma Glucose: 131.24 mg/dL

## 2023-08-10 LAB — GLUCOSE, CAPILLARY
Glucose-Capillary: 119 mg/dL — ABNORMAL HIGH (ref 70–99)
Glucose-Capillary: 143 mg/dL — ABNORMAL HIGH (ref 70–99)

## 2023-08-10 LAB — CBG MONITORING, ED
Glucose-Capillary: 122 mg/dL — ABNORMAL HIGH (ref 70–99)
Glucose-Capillary: 115 mg/dL — ABNORMAL HIGH (ref 70–99)
Glucose-Capillary: 132 mg/dL — ABNORMAL HIGH (ref 70–99)

## 2023-08-10 LAB — C-REACTIVE PROTEIN: CRP: 9.8 mg/dL — ABNORMAL HIGH (ref <1.0)

## 2023-08-10 MED ORDER — CARVEDILOL 6.25 MG PO TABS
6.2500 mg | ORAL_TABLET | Freq: Two times a day (BID) | ORAL | Status: DC
  Administered 2023-08-10 – 2023-08-11 (×2): 6.25 mg via ORAL
  Filled 2023-08-10 (×2): qty 1

## 2023-08-10 MED ORDER — SODIUM CHLORIDE 0.9 % IV SOLN
2.0000 g | INTRAVENOUS | Status: DC
  Administered 2023-08-10 – 2023-08-11 (×2): 2 g via INTRAVENOUS
  Filled 2023-08-10 (×2): qty 20

## 2023-08-10 MED ORDER — BENZONATATE 100 MG PO CAPS
200.0000 mg | ORAL_CAPSULE | Freq: Three times a day (TID) | ORAL | Status: DC
  Administered 2023-08-10 – 2023-08-11 (×2): 200 mg via ORAL
  Filled 2023-08-10 (×2): qty 2

## 2023-08-10 MED ORDER — INSULIN ASPART 100 UNIT/ML IJ SOLN
0.0000 [IU] | Freq: Three times a day (TID) | INTRAMUSCULAR | Status: DC
  Administered 2023-08-10: 1 [IU] via SUBCUTANEOUS
  Administered 2023-08-11: 3 [IU] via SUBCUTANEOUS

## 2023-08-10 MED ORDER — GUAIFENESIN-DM 100-10 MG/5ML PO SYRP: 5.0000 mL | ORAL_SOLUTION | ORAL | Status: DC | PRN

## 2023-08-10 MED ORDER — DOXYCYCLINE HYCLATE 100 MG PO TABS
100.0000 mg | ORAL_TABLET | Freq: Two times a day (BID) | ORAL | Status: DC
  Administered 2023-08-10 – 2023-08-11 (×3): 100 mg via ORAL
  Filled 2023-08-10 (×3): qty 1

## 2023-08-10 MED ORDER — INSULIN ASPART 100 UNIT/ML IJ SOLN: 0.0000 [IU] | Freq: Every day | INTRAMUSCULAR | Status: DC

## 2023-08-10 MED ORDER — SODIUM CHLORIDE 0.9 % IV SOLN: INTRAVENOUS | Status: AC

## 2023-08-10 NOTE — Progress Notes (Signed)
 Carotid artery duplex and bilateral lower venous duplex has been completed. Preliminary results can be found in CV Proc through chart review.   08/10/23 10:01 AM Cathlyn Collet RVT

## 2023-08-10 NOTE — Evaluation (Signed)
 Occupational Therapy Evaluation Patient Details Name: Sheila Reynolds MRN: [id number] DOB: [birth year] Today's Date: 08/10/2023   History of Present Illness   Sheila Reynolds is a 78 yo female who presented after N&V with syncopal fall. PMHx: previous stroke, anemia, anxiety, arthritis, CAD, fibromyalgia, HLD< HTN, OSA on CPAP, DMII     Clinical Impressions Sheila Reynolds was evaluated s/p the above admission list. She is mod I for most ADLs and mobility with SPC at baseline, her husband assists with LB ADLs intermittently, he is not able to provide 24/7 assist. Upon evaluation the pt was limited by weakness, unsteady balance, poor activity tolerance, report of dizziness with standing and numbness all over. Overall she required mod A for bed mobility and transfers and close CGA for minimal marching at the bed side. Due to the deficits listed below the pt also needs max A for LB ADLs and set up A for UB ADLs in sitting. Pt will benefit from continued acute OT services and skilled inpatient follow up therapy, <3 hours/day - however, per pt and her husband they are declining SNF but are open to HHOT. Discussed fall risk and safety with pt, husband reports she does not have 24/7 assist at home.      If plan is discharge home, recommend the following:   A lot of help with walking and/or transfers;A lot of help with bathing/dressing/bathroom;Assistance with cooking/housework;Assistance with feeding;Direct supervision/assist for medications management;Direct supervision/assist for financial management;Assist for transportation;Help with stairs or ramp for entrance;Supervision due to cognitive status     Functional Status Assessment   Patient has had a recent decline in their functional status and demonstrates the ability to make significant improvements in function in a reasonable and predictable amount of time.     Equipment Recommendations   BSC/3in1      Precautions/Restrictions    Precautions Precautions: Fall Restrictions Weight Bearing Restrictions Per Provider Order: No     Mobility Bed Mobility Overal bed mobility: Needs Assistance Bed Mobility: Supine to Sit, Sit to Supine     Supine to sit: Mod assist Sit to supine: Mod assist        Transfers Overall transfer level: Needs assistance Equipment used:  (back of chair) Transfers: Sit to/from Stand Sit to Stand: Mod assist           General transfer comment: increased assist due to elevated stretcher height      Balance Overall balance assessment: Needs assistance Sitting-balance support: Feet supported Sitting balance-Leahy Scale: Fair     Standing balance support: During functional activity Standing balance-Leahy Scale: Poor                             ADL either performed or assessed with clinical judgement   ADL Overall ADL's : Needs assistance/impaired Eating/Feeding: Set up;Sitting   Grooming: Set up;Sitting   Upper Body Bathing: Set up;Sitting   Lower Body Bathing: Maximal assistance;Sit to/from stand   Upper Body Dressing : Set up;Sitting   Lower Body Dressing: Maximal assistance;Sit to/from stand   Toilet Transfer: Moderate assistance;Stand-pivot;Rolling walker (2 wheels);BSC/3in1   Toileting- Clothing Manipulation and Hygiene: Maximal assistance;Sit to/from stand       Functional mobility during ADLs: Moderate assistance General ADL Comments: very limited activity tolerance, unsteady balance, weak, report of dizziness after stanging     Vision Baseline Vision/History: 0 No visual deficits Vision Assessment?: No apparent visual deficits     Perception Perception: Not tested  Praxis Praxis: Not tested       Pertinent Vitals/Pain Pain Assessment Pain Assessment: Faces Faces Pain Scale: Hurts a little bit Pain Location: all over Pain Descriptors / Indicators: Discomfort     Extremity/Trunk Assessment Upper Extremity  Assessment Upper Extremity Assessment: Generalized weakness   Lower Extremity Assessment Lower Extremity Assessment: Defer to PT evaluation   Cervical / Trunk Assessment Cervical / Trunk Assessment: Kyphotic   Communication Communication Communication: Impaired Factors Affecting Communication: Hearing impaired   Cognition Arousal: Alert Behavior During Therapy: Flat affect Cognition: No apparent impairments             OT - Cognition Comments: oriented and follows simple comands. HOH. slow to process                 Following commands: Intact       Cueing  General Comments   Cueing Techniques: Verbal cues;Gestural cues  VSS, pt reported dizziness after standing           Home Living Family/patient expects to be discharged to:: Private residence Living Arrangements: Spouse/significant other Available Help at Discharge: Family;Available PRN/intermittently Type of Home: House Home Access: Level entry (side door)     Home Layout: Multi-level Alternate Level Stairs-Number of Steps: 7 steps up to bedroom/bath   Bathroom Shower/Tub: Producer, television/film/video: Standard     Home Equipment: Agricultural consultant (2 wheels);Cane - single point;Shower seat - built in          Prior Functioning/Environment Prior Level of Function : Independent/Modified Independent             Mobility Comments: ambulates with SPC, denies falls ADLs Comments: husband assists with compression stockings, otherwise mod I. Does not drive    OT Problem List: Decreased strength;Decreased range of motion;Decreased activity tolerance;Impaired balance (sitting and/or standing);Decreased cognition;Decreased safety awareness;Decreased knowledge of use of DME or AE;Decreased knowledge of precautions   OT Treatment/Interventions: Self-care/ADL training;Therapeutic exercise;DME and/or AE instruction;Energy conservation;Therapeutic activities;Patient/family education;Balance  training      OT Goals(Current goals can be found in the care plan section)   Acute Rehab OT Goals Patient Stated Goal: home OT Goal Formulation: With patient Time For Goal Achievement: 08/24/23 Potential to Achieve Goals: Good ADL Goals Pt Will Perform Grooming: with modified independence;standing Pt Will Perform Upper Body Dressing: with modified independence Pt Will Perform Lower Body Dressing: with contact guard assist Pt Will Transfer to Toilet: with contact guard assist;ambulating   OT Frequency:  Min 3X/week       AM-PAC OT 6 Clicks Daily Activity     Outcome Measure Help from another person eating meals?: A Little Help from another person taking care of personal grooming?: A Little Help from another person toileting, which includes using toliet, bedpan, or urinal?: A Lot Help from another person bathing (including washing, rinsing, drying)?: A Lot Help from another person to put on and taking off regular upper body clothing?: A Little Help from another person to put on and taking off regular lower body clothing?: A Lot 6 Click Score: 15   End of Session Equipment Utilized During Treatment: Gait belt Nurse Communication: Mobility status  Activity Tolerance: Patient tolerated treatment well Patient left: in bed;with call bell/phone within reach;with family/visitor present  OT Visit Diagnosis: Unsteadiness on feet (R26.81);Other abnormalities of gait and mobility (R26.89);Muscle weakness (generalized) (M62.81);History of falling (Z91.81)                Time: 9159-9091 OT Time Calculation (min):  28 min Charges:  OT General Charges $OT Visit: 1 Visit OT Evaluation $OT Eval Moderate Complexity: 1 Mod OT Treatments $Self Care/Home Management : 8-22 mins  Sheila Reynolds, OTR/L Acute Rehabilitation Services Office [phone] Secure Chat Communication Preferred   Sheila Reynolds 08/10/2023, 9:55 AM

## 2023-08-10 NOTE — ED Notes (Signed)
 Checked patient cbg it was 98 notified RN of blood sugar patient is resting with family at bedside and call bell in reach

## 2023-08-10 NOTE — Progress Notes (Signed)
 [facility]                                                                              [name], is a 78 y.o. female, DOB - 1945/08/15, FMW:[id number] Admit date - 08/09/2023    Outpatient Primary MD for the patient is [name], MD  LOS - 1  days  Chief Complaint  Patient presents with   possible sepsis       Brief summary   Patient is a 78 year old female with GERD, fibromyalgia, CAD, asthma, HTN, HLP, NSVT, OSA on CPAP, PVCs, sarcoidosis, type II DM presented to ED with nausea vomiting diarrhea for the past few weeks off-and-on.   Per patient's husband at the bedside, she had gone to her sisters birthday party a day before, returned around 8 PM and then indicated that she was tired and wanted to lay down.  Subsequently she informed that she felt nauseous, had vomiting.  Overnight, around 2 AM, she got up due to nausea/vomiting and had a fall.  In the morning, husband found her laying in the bathroom around 7:30 AM with the vomiting around her.  Per EMS, she looked weak.   She also reportedly had cough for approximately 3 weeks. No history of seizures, however family reported history of previous stroke, being confused or fainting episodes.  Patient also complained of left wrist pain and headache, dysuria, abdominal pain.  Assessment & Plan    Principal Problem:   Syncope and collapse -Suspect likely due to dehydration, vasovagal - CT head showed no acute CVA, MRI brain confirmed no evidence of acute intracranial abnormality - Carotid Dopplers showed 1 to 39% stenosis right and left ICA - 2D echo pending - PT OT evaluation  Active Problems: Nausea, vomiting, diarrhea - Currently no diarrhea, continue antiemetics as needed, IV fluids - Follow GI pathogen panel, C. difficile  Acute cystitis without hematuria - Patient has been complaining of dysuria, UA showed large leukocytes, rare bacteria, WBCs 21-50.  CT showed bladder wall thickening. -  Urine culture showed more than 100,000 colonies of GNR - Continue IV Rocephin , follow sensitivities  Community-acquired pneumonia -Patient has been reporting cough for last 3 weeks - CTA chest showed no PE, scattered opacities with a cavitary lesion in the right upper lobe, not significantly changed from prior exam, tree-in-bud nodules noted in this region which may be infectious or inflammatory, continued surveillance or PET/CT may be beneficial, emphysema - Continue IV Rocephin , doxycycline   - SLP evaluation - Repeat CT outpatient, will defer to patient's pulmonologist, Dr. Geronimo     CAD (coronary artery disease), hyperlipidemia - Currently no acute complaints, continue aspirin , Lipitor   Sarcoidosis -Continue azathioprine 75 mg daily - Continue on Alphagan  Azopt  Pred forte.   Diabetes mellitus type 2, NIDDM - Placed on sliding scale insulin  while inpatient, follow HbA1c  Estimated body mass index is 30.54 kg/m as calculated from the following:   Height as of 07/31/23: 5' 2 (1.575 m).   Weight as of 07/31/23: 75.8 kg.  Code Status: Full code DVT Prophylaxis:  heparin  injection 5,000 Units Start: 08/09/23 2200   Level of Care: Level  of care: Telemetry Cardiac Family Communication: Updated patient's husband at the bedside Disposition Plan:      Remains inpatient appropriate:      Procedures:    Consultants:     Antimicrobials:   Anti-infectives (From admission, onward)    Start     Dose/Rate Route Frequency Ordered Stop   08/10/23 1000  cefTRIAXone  (ROCEPHIN ) 2 g in sodium chloride  0.9 % 100 mL IVPB        2 g 200 mL/hr over 30 Minutes Intravenous Every 24 hours 08/10/23 0742     08/10/23 1000  doxycycline  (VIBRA -TABS) tablet 100 mg        100 mg Oral Every 12 hours 08/10/23 0742     08/09/23 1300  cefTRIAXone  (ROCEPHIN ) 1 g in sodium chloride  0.9 % 100 mL IVPB        1 g 200 mL/hr over 30 Minutes Intravenous  Once 08/09/23 1248 08/09/23 1401   08/09/23 1300   doxycycline  (VIBRA -TABS) tablet 100 mg        100 mg Oral  Once 08/09/23 1248 08/09/23 1326          Medications  aspirin  EC  81 mg Oral Daily   atorvastatin   80 mg Oral Daily   azaTHIOprine  75 mg Oral Daily   brimonidine   1 drop Both Eyes Q8H   brinzolamide   1 drop Both Eyes TID   doxycycline   100 mg Oral Q12H   ezetimibe   10 mg Oral Daily   heparin  injection (subcutaneous)  5,000 Units Subcutaneous Q8H   insulin  aspart  0-9 Units Subcutaneous Q4H   ketorolac   1 drop Right Eye BID   montelukast   10 mg Oral Daily   pantoprazole   40 mg Oral Daily   prednisoLONE acetate  2 drop Both Eyes Q8H   pregabalin   75 mg Oral BID   sodium chloride  flush  3-10 mL Intravenous Q12H      Subjective:   Sheila Reynolds was seen and examined today.  No acute complaints, no ongoing nausea vomiting or diarrhea.  Has generalized weakness, husband at the bedside.   Objective:   Vitals:   08/10/23 0715 08/10/23 0730 08/10/23 0745 08/10/23 0800  BP:  138/74  (!) 148/79  Pulse: 87 86 85 100  Resp: 17 15 16 20   Temp:      TempSrc:      SpO2: 99% 98% 99% 99%    Intake/Output Summary (Last 24 hours) at 08/10/2023 1120 Last data filed at 08/09/2023 1801 Gross per 24 hour  Intake 96.38 ml  Output 600 ml  Net -503.62 ml     Wt Readings from Last 3 Encounters:  07/31/23 75.8 kg  07/21/23 72.6 kg  07/06/23 77.1 kg     Exam General: Alert and oriented x 3, NAD Cardiovascular: S1 S2 auscultated,  RRR Respiratory: Diminished breath sounds at the bases Gastrointestinal: Soft, nontender, nondistended, + bowel sounds Ext: no pedal edema bilaterally Neuro: no new deficits Psych: Normal affect     Data Reviewed:  I have personally reviewed following labs    CBC Lab Results  Component Value Date   WBC 7.4 08/10/2023   RBC 4.23 08/10/2023   HGB 11.8 (L) 08/10/2023   HCT 35.8 (L) 08/10/2023   MCV 84.6 08/10/2023   MCH 27.9 08/10/2023   PLT 190 08/10/2023   MCHC 33.0 08/10/2023    RDW 14.1 08/10/2023   LYMPHSABS 0.4 (L) 08/09/2023   MONOABS 1.1 (H) 08/09/2023   EOSABS 0.2 08/09/2023  BASOSABS 0.0 08/09/2023     Last metabolic panel Lab Results  Component Value Date   NA 134 (L) 08/10/2023   K 3.4 (L) 08/10/2023   CL 100 08/10/2023   CO2 25 08/10/2023   BUN 11 08/10/2023   CREATININE 0.73 08/10/2023   GLUCOSE 136 (H) 08/10/2023   GFRNONAA >60 08/10/2023   GFRAA 91 03/28/2020   CALCIUM  8.3 (L) 08/10/2023   PHOS 2.7 08/10/2023   PROT 6.5 08/09/2023   ALBUMIN 3.1 (L) 08/10/2023   LABGLOB 2.2 02/23/2023   AGRATIO 1.7 06/13/2021   BILITOT 1.5 (H) 08/09/2023   ALKPHOS 65 08/09/2023   AST 34 08/09/2023   ALT 24 08/09/2023   ANIONGAP 9 08/10/2023    CBG (last 3)  Recent Labs    08/09/23 2224 08/10/23 0333 08/10/23 0804  GLUCAP 122* 122* 115*      Coagulation Profile: No results for input(s): INR, PROTIME in the last 168 hours.   Radiology Studies: I have personally reviewed the imaging studies  VAS US  LOWER EXTREMITY VENOUS (DVT) Result Date: 08/10/2023  Lower Venous DVT Study Patient Name:  DARLINA MCCAUGHEY  Date of Exam:   08/10/2023 Medical Rec #: [id number]           Accession #:    [id number] Date of Birth: 07-31-1945           Patient Gender: F Patient Age:   84 years Exam Location:  [facility] Procedure:      VAS US  LOWER EXTREMITY VENOUS (DVT) Referring Phys: Jaleigh Mccroskey --------------------------------------------------------------------------------  Indications: Edema.  Risk Factors: None identified. Limitations: Poor ultrasound/tissue interface. Comparison Study: No prior studies. Performing Technologist: Cordella Collet RVT  Examination Guidelines: A complete evaluation includes B-mode imaging, spectral Doppler, color Doppler, and power Doppler as needed of all accessible portions of each vessel. Bilateral testing is considered an integral part of a complete examination. Limited examinations for reoccurring indications may  be performed as noted. The reflux portion of the exam is performed with the patient in reverse Trendelenburg.  +---------+---------------+---------+-----------+----------+--------------+ RIGHT    CompressibilityPhasicitySpontaneityPropertiesThrombus Aging +---------+---------------+---------+-----------+----------+--------------+ CFV      Full           Yes      Yes                                 +---------+---------------+---------+-----------+----------+--------------+ SFJ      Full                                                        +---------+---------------+---------+-----------+----------+--------------+ FV Prox  Full                                                        +---------+---------------+---------+-----------+----------+--------------+ FV Mid   Full                                                        +---------+---------------+---------+-----------+----------+--------------+  FV DistalFull                                                        +---------+---------------+---------+-----------+----------+--------------+ PFV      Full                                                        +---------+---------------+---------+-----------+----------+--------------+ POP      Full           Yes      Yes                                 +---------+---------------+---------+-----------+----------+--------------+ PTV      Full                                                        +---------+---------------+---------+-----------+----------+--------------+ PERO     Full                                                        +---------+---------------+---------+-----------+----------+--------------+   +---------+---------------+---------+-----------+----------+--------------+ LEFT     CompressibilityPhasicitySpontaneityPropertiesThrombus Aging +---------+---------------+---------+-----------+----------+--------------+ CFV       Full           Yes      Yes                                 +---------+---------------+---------+-----------+----------+--------------+ SFJ      Full                                                        +---------+---------------+---------+-----------+----------+--------------+ FV Prox  Full                                                        +---------+---------------+---------+-----------+----------+--------------+ FV Mid   Full           Yes      Yes                                 +---------+---------------+---------+-----------+----------+--------------+ FV DistalFull           Yes      Yes                                 +---------+---------------+---------+-----------+----------+--------------+  PFV      Full                                                        +---------+---------------+---------+-----------+----------+--------------+ POP      Full           Yes      Yes                                 +---------+---------------+---------+-----------+----------+--------------+ PTV      Full                                                        +---------+---------------+---------+-----------+----------+--------------+ PERO     Full                                                        +---------+---------------+---------+-----------+----------+--------------+    Summary: RIGHT: - There is no evidence of deep vein thrombosis in the lower extremity.  - No cystic structure found in the popliteal fossa.  LEFT: - There is no evidence of deep vein thrombosis in the lower extremity.  - No cystic structure found in the popliteal fossa.  *See table(s) above for measurements and observations.    Preliminary    VAS US  CAROTID (at [facility] and [facility] only) Result Date: 08/10/2023 Carotid Arterial Duplex Study Patient Name:  NANIE DUNKLEBERGER [facility]  Date of Exam:   08/10/2023 Medical Rec #: [id number]           Accession #:    [id number] Date of Birth: 10-17-1945            Patient Gender: F Patient Age:   76 years Exam Location:  [facility] Procedure:      VAS US  CAROTID Referring Phys: EKTA PATEL --------------------------------------------------------------------------------  Indications:       Syncope. Risk Factors:      Hypertension, hyperlipidemia, Diabetes. Comparison Study:  No prior studies. Performing Technologist: Cordella Collet RVT  Examination Guidelines: A complete evaluation includes B-mode imaging, spectral Doppler, color Doppler, and power Doppler as needed of all accessible portions of each vessel. Bilateral testing is considered an integral part of a complete examination. Limited examinations for reoccurring indications may be performed as noted.  Right Carotid Findings: +----------+--------+--------+--------+-----------------------+--------+           PSV cm/sEDV cm/sStenosisPlaque Description     Comments +----------+--------+--------+--------+-----------------------+--------+ CCA Prox  109     18              smooth and heterogenous         +----------+--------+--------+--------+-----------------------+--------+ CCA Distal62      11              smooth and heterogenous         +----------+--------+--------+--------+-----------------------+--------+ ICA Prox  42      12              smooth and heterogenous         +----------+--------+--------+--------+-----------------------+--------+  ICA Mid   72      19                                     tortuous +----------+--------+--------+--------+-----------------------+--------+ ICA Distal87      24                                     tortuous +----------+--------+--------+--------+-----------------------+--------+ ECA       61      8                                               +----------+--------+--------+--------+-----------------------+--------+ +----------+--------+-------+--------+-------------------+           PSV cm/sEDV cmsDescribeArm  Pressure (mmHG) +----------+--------+-------+--------+-------------------+ [id number]                                        +----------+--------+-------+--------+-------------------+ +---------+--------+--+--------+--+---------+ VertebralPSV cm/s60EDV cm/s14Antegrade +---------+--------+--+--------+--+---------+  Left Carotid Findings: +----------+--------+-------+--------+--------------------------------+--------+           PSV cm/sEDV    StenosisPlaque Description              Comments                   cm/s                                                    +----------+--------+-------+--------+--------------------------------+--------+ CCA Prox  129     22             smooth and heterogenous                  +----------+--------+-------+--------+--------------------------------+--------+ CCA Distal74      17             smooth and heterogenous                  +----------+--------+-------+--------+--------------------------------+--------+ ICA Prox  70      16             calcific, smooth and                                                      heterogenous                             +----------+--------+-------+--------+--------------------------------+--------+ ICA Mid   72      19                                                      +----------+--------+-------+--------+--------------------------------+--------+ ICA Distal75      23  tortuous +----------+--------+-------+--------+--------------------------------+--------+ ECA       52      5                                                       +----------+--------+-------+--------+--------------------------------+--------+ +----------+--------+--------+--------+-------------------+           PSV cm/sEDV cm/sDescribeArm Pressure (mmHG) +----------+--------+--------+--------+-------------------+ Subclavian108                                          +----------+--------+--------+--------+-------------------+ +---------+--------+--+--------+--+---------+ VertebralPSV cm/s83EDV cm/s23Antegrade +---------+--------+--+--------+--+---------+   Summary: Right Carotid: Velocities in the right ICA are consistent with a 1-39% stenosis. Left Carotid: Velocities in the left ICA are consistent with a 1-39% stenosis. Vertebrals: Bilateral vertebral arteries demonstrate antegrade flow. *See table(s) above for measurements and observations.     Preliminary    MR BRAIN WO CONTRAST Result Date: 08/09/2023 CLINICAL DATA:  Mental status change, unknown cause EXAM: MRI HEAD WITHOUT CONTRAST TECHNIQUE: Multiplanar, multiecho pulse sequences of the brain and surrounding structures were obtained without intravenous contrast. COMPARISON:  CT head June 29, 25. FINDINGS: Brain: No acute infarction, hemorrhage, hydrocephalus, extra-axial collection or mass lesion. Mild to moderate for age scattered T2/FLAIR hyperintensities in the white matter, compatible with chronic microvascular ischemic disease. Vascular: Normal flow voids. Skull and upper cervical spine: Normal marrow signal. Sinuses/Orbits: Mild paranasal sinus mucosal thickening. Other: No mastoid effusions. IMPRESSION: No evidence of acute intracranial abnormality. Electronically Signed   By: Gilmore GORMAN Molt M.D.   On: 08/09/2023 19:54   CT Angio Chest/Abd/Pel for Dissection W and/or W/WO Result Date: 08/09/2023 CLINICAL DATA:  Syncope and collapse. Clinical concern for dissection or pulmonary embolism. EXAM: CT ANGIOGRAPHY CHEST, ABDOMEN AND PELVIS TECHNIQUE: Non-contrast CT of the chest was initially obtained. Multidetector CT imaging through the chest, abdomen and pelvis was performed using the standard protocol during bolus administration of intravenous contrast. Multiplanar reconstructed images and MIPs were obtained and reviewed to evaluate the vascular anatomy. RADIATION DOSE  REDUCTION: This exam was performed according to the departmental dose-optimization program which includes automated exposure control, adjustment of the mA and/or kV according to patient size and/or use of iterative reconstruction technique. CONTRAST:  OMNIPAQUE  IOHEXOL  350 MG/ML SOLN COMPARISON:  12/03/2022, 03/19/2016. FINDINGS: CTA CHEST FINDINGS Cardiovascular: The heart is normal in size and there is a small pericardial effusion. Multi-vessel coronary artery calcifications are noted. There is atherosclerotic calcification of the aorta without evidence of aneurysm. The pulmonary trunk is normal in caliber. No dissection is seen. There is no evident evidence of pulmonary embolism, however protocol is not for PE. Mediastinum/Nodes: No mediastinal, hilar, or axillary lymphadenopathy is seen. The thyroid  gland, trachea, and esophagus are within normal limits. Lungs/Pleura: Centrilobular emphysematous changes are noted in the lungs. Bronchial wall thickening is noted bilaterally. Scattered opacities in tree-in-bud nodular densities are present in the right upper lobe with a cavitary component, not significantly changed from the prior exam. No effusion or pneumothorax is seen. Musculoskeletal: Bilateral breast implants are noted. Degenerative changes are present in the thoracic spine. No acute fracture is seen. Stable scattered sclerotic lesions are noted in the bones, likely bone islands. Review of the MIP images confirms the above findings. CTA ABDOMEN AND PELVIS FINDINGS VASCULAR Aorta: Normal caliber  aorta without aneurysm, dissection, vasculitis or significant stenosis. Celiac: Patent without evidence of aneurysm, dissection, vasculitis or significant stenosis. SMA: Patent without evidence of aneurysm, dissection, vasculitis or significant stenosis. Renals: Both renal arteries are patent without evidence of aneurysm, dissection, vasculitis, fibromuscular dysplasia or significant stenosis. IMA: Patent without  evidence of aneurysm, dissection, vasculitis or significant stenosis. Inflow: Patent without evidence of aneurysm, dissection, vasculitis or significant stenosis. Veins: No obvious venous abnormality within the limitations of this arterial phase study. Review of the MIP images confirms the above findings. NON-VASCULAR Hepatobiliary: No focal liver abnormality is seen. No gallstones, gallbladder wall thickening, or biliary dilatation. Pancreas: Unremarkable. No pancreatic ductal dilatation or surrounding inflammatory changes. Spleen: Normal in size without focal abnormality. Adrenals/Urinary Tract: The adrenal glands are within normal limits. The kidneys enhance symmetrically. No renal calculus or hydronephrosis bilaterally. There is mild bladder wall thickening with perivesicular fat stranding. Stomach/Bowel: Stomach is within normal limits. Appendix appears normal. No evidence of bowel wall thickening, distention, or inflammatory changes. No free air or pneumatosis. Scattered diverticula are present along the colon without evidence of diverticulitis. Lymphatic: No abdominal or pelvic lymphadenopathy by size criteria. Reproductive: Status post hysterectomy. No adnexal masses. Other: No abdominopelvic ascites. A small fat containing umbilical hernia is noted. Musculoskeletal: Degenerative changes are present in the lumbar spine. Review of the MIP images confirms the above findings. IMPRESSION: 1. Aortic atherosclerosis without evidence of aneurysm or dissection. 2. No definite evidence of pulmonary embolism, however examination is limited due to study protocol. 3. Mild bladder wall thickening with perivesicular fat stranding, which may be infectious or inflammatory. 4. Scattered opacities with cavitary lesion in the right upper lobe, not significantly changed from the prior exam. Additional tree-in-bud nodules are noted in this region, which may be infectious or inflammatory. The possibility of underlying malignancy  cannot be excluded. Continued surveillance or PET-CT may be beneficial for further evaluation. 5. Emphysema. Electronically Signed   By: Leita Birmingham M.D.   On: 08/09/2023 16:48   DG Forearm Left Result Date: 08/09/2023 CLINICAL DATA:  fall EXAM: LEFT FOREARM - 2 VIEW COMPARISON:  None Available. FINDINGS: No acute fracture or dislocation. There is no evidence of arthropathy or other focal bone abnormality. Soft tissues are unremarkable. IMPRESSION: No acute fracture or dislocation. Electronically Signed   By: Rogelia Myers M.D.   On: 08/09/2023 14:49   DG Chest Port 1 View Result Date: 08/09/2023 CLINICAL DATA:  [id number] Suspected sepsis [id number] EXAM: PORTABLE CHEST - 1 VIEW COMPARISON:  07/21/2023 FINDINGS: Lower lung volumes with some increased prominence of bibasilar interstitial markings, no confluent airspace disease. Heart size and mediastinal contours are within normal limits. Aortic Atherosclerosis (ICD10-170.0). Mild blunting of lateral costophrenic angles. Visualized bones unremarkable. IMPRESSION: Lower lung volumes with increased bibasilar interstitial markings. Electronically Signed   By: JONETTA Faes M.D.   On: 08/09/2023 10:26   CT Head Wo Contrast Result Date: 08/09/2023 CLINICAL DATA:  Patient found on floor by family. Possible fall. Nausea and vomiting last night EXAM: CT HEAD WITHOUT CONTRAST CT CERVICAL SPINE WITHOUT CONTRAST TECHNIQUE: Multidetector CT imaging of the head and cervical spine was performed following the standard protocol without intravenous contrast. Multiplanar CT image reconstructions of the cervical spine were also generated. RADIATION DOSE REDUCTION: This exam was performed according to the departmental dose-optimization program which includes automated exposure control, adjustment of the mA and/or kV according to patient size and/or use of iterative reconstruction technique. COMPARISON:  CT head and cervical spine 12/02/2022 FINDINGS: CT  HEAD FINDINGS Brain:  Ventricles, cisterns and other CSF spaces are normal. 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 No mass, mass effect, shift of midline structures or acute hemorrhage. No definite acute infarction. Vascular: No hyperdense vessel or unexpected calcification. Skull: Normal. Negative for fracture or focal lesion. Sinuses/Orbits: Orbits are normal and symmetric. Mild opacification over the ethmoid air cells as remaining paranasal sinuses are clear. Mastoid air cells are clear. Other: None. CT CERVICAL SPINE FINDINGS Alignment: Normal. Skull base and vertebrae: Vertebral body heights are normal. There is mild spondylosis of the cervical spine with mild uncovertebral joint spurring and facet arthropathy. No acute fracture or subluxation. Atlantoaxial articulation is unremarkable. Soft tissues and spinal canal: No prevertebral fluid or swelling. No visible canal hematoma. Disc levels: No significant disc space narrowing. No obvious traumatic disc herniation. Upper chest: Stable focal opacification over the right apex previously evaluated by chest CT 01/23/2023. Other: None. IMPRESSION: 1. No acute intracranial findings. 2. 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 3. No acute findings in the cervical spine. Mild spondylosis of the cervical spine. 4. Stable chronic focal opacification over the right apex previously evaluated by chest CT 01/23/2023. Electronically Signed   By: Toribio Agreste M.D.   On: 08/09/2023 10:22   CT Cervical Spine Wo Contrast Result Date: 08/09/2023 CLINICAL DATA:  Patient found on floor by family. Possible fall. Nausea and vomiting last night EXAM: CT HEAD WITHOUT CONTRAST CT CERVICAL SPINE WITHOUT CONTRAST TECHNIQUE: Multidetector CT imaging of the head and cervical spine was performed following the standard protocol without intravenous contrast.  Multiplanar CT image reconstructions of the cervical spine were also generated. RADIATION DOSE REDUCTION: This exam was performed according to the departmental dose-optimization program which includes automated exposure control, adjustment of the mA and/or kV according to patient size and/or use of iterative reconstruction technique. COMPARISON:  CT head and cervical spine 12/02/2022 FINDINGS: CT HEAD FINDINGS Brain: Ventricles, cisterns and other CSF spaces are normal. 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 No mass, mass effect, shift of midline structures or acute hemorrhage. No definite acute infarction. Vascular: No hyperdense vessel or unexpected calcification. Skull: Normal. Negative for fracture or focal lesion. Sinuses/Orbits: Orbits are normal and symmetric. Mild opacification over the ethmoid air cells as remaining paranasal sinuses are clear. Mastoid air cells are clear. Other: None. CT CERVICAL SPINE FINDINGS Alignment: Normal. Skull base and vertebrae: Vertebral body heights are normal. There is mild spondylosis of the cervical spine with mild uncovertebral joint spurring and facet arthropathy. No acute fracture or subluxation. Atlantoaxial articulation is unremarkable. Soft tissues and spinal canal: No prevertebral fluid or swelling. No visible canal hematoma. Disc levels: No significant disc space narrowing. No obvious traumatic disc herniation. Upper chest: Stable focal opacification over the right apex previously evaluated by chest CT 01/23/2023. Other: None. IMPRESSION: 1. No acute intracranial findings. 2. 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 3. No acute findings in the cervical spine. Mild spondylosis of the cervical spine. 4. Stable chronic focal opacification over the right apex previously evaluated by chest CT 01/23/2023. Electronically  Signed   By: Toribio Agreste M.D.   On: 08/09/2023 10:22       Minette Manders M.D. [facility] 08/10/2023, 11:20 AM  Available via Epic secure chat 7am-7pm After 7 pm, please  refer to night coverage provider listed on amion.

## 2023-08-10 NOTE — ED Notes (Signed)
 CCMD called to admit the patient for cardiac monitoring.

## 2023-08-10 NOTE — Progress Notes (Signed)
   08/10/23 0741  TOC Barriers to Discharge  Barriers to Discharge Other (must enter comment) (awaiting PT evaluation)   RNCM consulted regarding safe discharge planning (Home with Home Health vs Skilled Nursing Facility Placement).  Physical Therapy evaluation placed; will follow up after recommendations from PT.     Daveigh Batty J. Debarah, BSN, RN, NCM  Transitions of Care  Nurse Case Manager  [facility]  Operative Services  [phone]

## 2023-08-10 NOTE — Evaluation (Signed)
 Physical Therapy Evaluation Patient Details Name: Sheila Reynolds MRN: [id number] DOB: 1945/06/13 Today's Date: 08/10/2023  History of Present Illness  Sheila Reynolds is a 78 yo female who presented after N&V with syncopal fall. PMHx: previous stroke, anemia, anxiety, arthritis, CAD, fibromyalgia, HLD< HTN, OSA on CPAP, DMII  Clinical Impression  Pt admitted with above diagnosis and presents to PT with functional limitations due to deficits listed below (See PT problem list). Pt needs skilled PT to maximize independence and safety. Pt moving very slowly. Pt/family decline SNF and plan for pt to return home. Recommend HHPT. Currently requiring assist for ambulation, bed mobility, and transfers.          If plan is discharge home, recommend the following: A little help with walking and/or transfers;A little help with bathing/dressing/bathroom;Assistance with cooking/housework;Help with stairs or ramp for entrance;Assist for transportation   Can travel by private vehicle        Equipment Recommendations None recommended by PT  Recommendations for Other Services       Functional Status Assessment Patient has had a recent decline in their functional status and demonstrates the ability to make significant improvements in function in a reasonable and predictable amount of time.     Precautions / Restrictions Precautions Precautions: Fall Restrictions Weight Bearing Restrictions Per Provider Order: No      Mobility  Bed Mobility Overal bed mobility: Needs Assistance Bed Mobility: Supine to Sit, Sit to Supine     Supine to sit: Mod assist Sit to supine: Mod assist   General bed mobility comments: Assist to bring legs off of bed, elevate trunk into sitting, and bring hips to EOB. Assist to bring legs back up into bed    Transfers Overall transfer level: Needs assistance Equipment used: Rolling walker (2 wheels) Transfers: Sit to/from Stand Sit to Stand: Min assist, From  elevated surface           General transfer comment: Assist for balance    Ambulation/Gait Ambulation/Gait assistance: Contact guard assist Gait Distance (Feet): 75 Feet Assistive device: Rolling walker (2 wheels) Gait Pattern/deviations: Step-to pattern, Decreased step length - right, Decreased step length - left, Shuffle Gait velocity: decr Gait velocity interpretation: <1.31 ft/sec, indicative of household ambulator   General Gait Details: Slow gait with assist for satety and cues to incr step length.  Stairs            Wheelchair Mobility     Tilt Bed    Modified Rankin (Stroke Patients Only)       Balance Overall balance assessment: Needs assistance Sitting-balance support: Feet supported Sitting balance-Leahy Scale: Fair     Standing balance support: During functional activity Standing balance-Leahy Scale: Poor Standing balance comment: walker and CGA for static standing                             Pertinent Vitals/Pain Pain Assessment Pain Assessment: Faces Faces Pain Scale: No hurt    Home Living Family/patient expects to be discharged to:: Private residence Living Arrangements: Spouse/significant other Available Help at Discharge: Family;Available PRN/intermittently Type of Home: House Home Access: Level entry (side door)     Alternate Level Stairs-Number of Steps: 7 steps up to bedroom/bath Home Layout: Multi-level Home Equipment: Agricultural consultant (2 wheels);Cane - single point;Shower seat - built in      Prior Function Prior Level of Function : Independent/Modified Independent  Mobility Comments: ambulates with SPC, denies falls ADLs Comments: husband assists with compression stockings, otherwise mod I. Does not drive     Extremity/Trunk Assessment   Upper Extremity Assessment Upper Extremity Assessment: Defer to OT evaluation    Lower Extremity Assessment Lower Extremity Assessment: Generalized  weakness    Cervical / Trunk Assessment Cervical / Trunk Assessment: Kyphotic  Communication   Communication Communication: Impaired Factors Affecting Communication: Hearing impaired    Cognition Arousal: Alert Behavior During Therapy: Flat affect   PT - Cognitive impairments: Problem solving                       PT - Cognition Comments: Slow to process Following commands: Intact       Cueing Cueing Techniques: Verbal cues, Gestural cues     General Comments General comments (skin integrity, edema, etc.): VSS.    Exercises     Assessment/Plan    PT Assessment Patient needs continued PT services  PT Problem List Decreased strength;Decreased activity tolerance;Decreased balance;Decreased mobility       PT Treatment Interventions DME instruction;Gait training;Stair training;Functional mobility training;Therapeutic activities;Therapeutic exercise;Balance training;Patient/family education    PT Goals (Current goals can be found in the Care Plan section)  Acute Rehab PT Goals Patient Stated Goal: go home PT Goal Formulation: With patient/family Time For Goal Achievement: 08/24/23 Potential to Achieve Goals: Good    Frequency Min 2X/week     Co-evaluation               AM-PAC PT 6 Clicks Mobility  Outcome Measure Help needed turning from your back to your side while in a flat bed without using bedrails?: A Little Help needed moving from lying on your back to sitting on the side of a flat bed without using bedrails?: A Lot Help needed moving to and from a bed to a chair (including a wheelchair)?: A Little Help needed standing up from a chair using your arms (e.g., wheelchair or bedside chair)?: A Little Help needed to walk in hospital room?: A Little Help needed climbing 3-5 steps with a railing? : A Lot 6 Click Score: 16    End of Session Equipment Utilized During Treatment: Gait belt Activity Tolerance: Patient tolerated treatment  well Patient left: in bed;with call bell/phone within reach;with family/visitor present   PT Visit Diagnosis: Other abnormalities of gait and mobility (R26.89);Muscle weakness (generalized) (M62.81);History of falling (Z91.81)    Time: 8789-8765 PT Time Calculation (min) (ACUTE ONLY): 24 min   Charges:   PT Evaluation $PT Eval Moderate Complexity: 1 Mod PT Treatments $Gait Training: 8-22 mins PT General Charges $$ ACUTE PT VISIT: 1 Visit         [facility] PT Acute Rehabilitation Services Office [phone]   Rodgers ORN [facility] 08/10/2023, 2:49 PM

## 2023-08-10 NOTE — Discharge Planning (Signed)
 OT has recommended skilled nursing facility placement but pt and family declines at this time; as they prefer home health services.RNCM reviewed [facility]/PING to find that pt had been active with [facility] in the past.  TOC will continue to follow should pt/family change their mind during the course of hospital stay.  Jlyn Bracamonte J. Debarah, BSN, RN, NCM  Transitions of Care  Nurse Case Manager  [facility]  Operative Services  [phone]

## 2023-08-11 ENCOUNTER — Other Ambulatory Visit (HOSPITAL_COMMUNITY): Payer: Self-pay

## 2023-08-11 ENCOUNTER — Encounter (HOSPITAL_BASED_OUTPATIENT_CLINIC_OR_DEPARTMENT_OTHER)

## 2023-08-11 ENCOUNTER — Inpatient Hospital Stay (HOSPITAL_COMMUNITY)

## 2023-08-11 DIAGNOSIS — R55 Syncope and collapse: Secondary | ICD-10-CM | POA: Diagnosis not present

## 2023-08-11 LAB — CBC
HCT: 36.1 % (ref 36.0–46.0)
Hemoglobin: 12 g/dL (ref 12.0–15.0)
MCH: 28.2 pg (ref 26.0–34.0)
MCHC: 33.2 g/dL (ref 30.0–36.0)
MCV: 84.9 fL (ref 80.0–100.0)
Platelets: 202 10*3/uL (ref 150–400)
RBC: 4.25 MIL/uL (ref 3.87–5.11)
RDW: 13.6 % (ref 11.5–15.5)
WBC: 7.4 10*3/uL (ref 4.0–10.5)
nRBC: 0 % (ref 0.0–0.2)

## 2023-08-11 LAB — RENAL FUNCTION PANEL
Albumin: 3.2 g/dL — ABNORMAL LOW (ref 3.5–5.0)
Anion gap: 7 (ref 5–15)
BUN: 7 mg/dL — ABNORMAL LOW (ref 8–23)
CO2: 25 mmol/L (ref 22–32)
Calcium: 8.5 mg/dL — ABNORMAL LOW (ref 8.9–10.3)
Chloride: 105 mmol/L (ref 98–111)
Creatinine, Ser: 0.63 mg/dL (ref 0.44–1.00)
GFR, Estimated: >60 mL/min (ref >60)
Glucose, Bld: 134 mg/dL — ABNORMAL HIGH (ref 70–99)
Phosphorus: 2.7 mg/dL (ref 2.5–4.6)
Potassium: 4.3 mmol/L (ref 3.5–5.1)
Sodium: 137 mmol/L (ref 135–145)

## 2023-08-11 LAB — ECHOCARDIOGRAM COMPLETE: S' Lateral: 2.7 cm

## 2023-08-11 LAB — URINE CULTURE: Culture: >=100000 — AB

## 2023-08-11 LAB — GLUCOSE, CAPILLARY
Glucose-Capillary: 203 mg/dL — ABNORMAL HIGH (ref 70–99)
Glucose-Capillary: 84 mg/dL (ref 70–99)

## 2023-08-11 MED ORDER — AMOXICILLIN-POT CLAVULANATE 875-125 MG PO TABS
1.0000 | ORAL_TABLET | Freq: Two times a day (BID) | ORAL | 0 refills | Status: DC
Start: 2023-08-11 — End: 2023-08-25
  Filled 2023-08-11: qty 8, 4d supply, fill #0

## 2023-08-11 MED ORDER — DOXYCYCLINE HYCLATE 100 MG PO TABS
100.0000 mg | ORAL_TABLET | Freq: Two times a day (BID) | ORAL | 0 refills | Status: AC
  Filled 2023-08-11: qty 8, 4d supply, fill #0

## 2023-08-11 MED ORDER — GLUCERNA SHAKE PO LIQD
237.0000 mL | Freq: Three times a day (TID) | ORAL | Status: DC
  Administered 2023-08-11 (×2): 237 mL via ORAL

## 2023-08-11 NOTE — Discharge Summary (Signed)
 Physician Discharge Summary  Sheila Reynolds FMW:[id number] DOB: 31-Jan-1946 DOA: 08/09/2023  PCP: Sheila Medici, MD  Admit date: 08/09/2023 Discharge date: 08/11/2023  Admitted From: home Disposition: home Recommendations for Outpatient Follow-up:  Follow up with PCP in 1-2 weeks Please obtain BMP/CBC in one week  Home Health:yes Equipment/Devices: none Discharge Condition:stable CODE STATUS:full Diet recommendation: cardiac  Brief/Interim Summary:  78 year old female with GERD, fibromyalgia, CAD, asthma, HTN, HLP, NSVT, OSA on CPAP, PVCs, sarcoidosis, type II DM presented to ED with nausea vomiting diarrhea for the past few weeks off-and-on.   Per patient's husband at the bedside, she had gone to her sisters birthday party a day before, returned around 8 PM and then indicated that she was tired and wanted to lay down.  Subsequently she informed that she felt nauseous, had vomiting.  Overnight, around 2 AM, she got up due to nausea/vomiting and had a fall.  In the morning, husband found her laying in the bathroom around 7:30 AM with the vomiting around her.  Per EMS, she looked weak.   She also reportedly had cough for approximately 3 weeks. No history of seizures, however family reported history of previous stroke, being confused or fainting episodes.  Patient also complained of left wrist pain and headache, dysuria, abdominal pain.  Discharge Diagnoses:  Principal Problem:   Syncope and collapse Active Problems:   CAD (coronary artery disease)    Syncope and collapse -Suspect likely due to dehydration, vasovagal - CT head showed no acute CVA, MRI brain confirmed no evidence of acute intracranial abnormality - Carotid Dopplers showed 1 to 39% stenosis right and left ICA - 2D echo Left ventricular ejection  fraction, by estimation, is 55 to 60%. The left ventricle has normal  function. The left ventricle has no regional wall motion abnormalities.  Left ventricular diastolic  function could not  be evaluated.  Right ventricular systolic function is normal. The right ventricular  size is normal.  The mitral valve is normal in structure. No evidence of mitral valve  regurgitation. No evidence of mitral stenosis. The aortic valve is normal in structure. Aortic valve regurgitation is  not visualized. No aortic stenosis is present. . The inferior vena cava is normal in size with greater than 50%  respiratory variability, suggesting right atrial pressure of 3 mmHg.  - PT OT evaluation rec home health PT   Active Problems: Nausea, vomiting, diarrhea RESOLVED - Currently no diarrhea, continue antiemetics as needed, IV fluids - Follow GI pathogen panel, C. difficile   Acute cystitis without hematuria - Patient has been complaining of dysuria, UA showed large leukocytes, rare bacteria, WBCs 21-50.  CT showed bladder wall thickening. - Urine culture showed more than 100,000 colonies of PROTEUS. DISCHARGED ON AUGMENTIN . - TREATED WITH IV Rocephin    Community-acquired pneumonia -Patient has been reporting cough for last 3 weeks - CTA chest showed no PE, scattered opacities with a cavitary lesion in the right upper lobe, not significantly changed from prior exam, tree-in-bud nodules noted in this region which may be infectious or inflammatory, continued surveillance or PET/CT may be beneficial, emphysema - TREATED WITH IV Rocephin , doxycycline   - Repeat CT outpatient, will defer to patient's pulmonologist, Dr. Geronimo      CAD (coronary artery disease), hyperlipidemia - Currently no acute complaints, continue aspirin , Lipitor     Sarcoidosis -Continue azathioprine 75 mg daily - Continue on Alphagan  Azopt  Pred forte.     Diabetes mellitus type 2, NIDDM - Placed on sliding scale insulin  while  inpatient, follow HbA1c   Estimated body mass index is 30.54 kg/m as calculated from the following:   Height as of 07/31/23: 5' 2 (1.575 m).   Weight as of 07/31/23: 75.8  kg.    Nutrition Problem: Inadequate oral intake Etiology: poor appetite, nausea, vomiting, diarrhea    Signs/Symptoms: estimated needs, per patient/family report     Interventions: Glucerna shake  Estimated body mass index is 30.54 kg/m as calculated from the following:   Height as of 07/31/23: 5' 2 (1.575 m).   Weight as of 07/31/23: 75.8 kg.  Discharge Instructions  Discharge Instructions     Diet - low sodium heart healthy   Complete by: As directed    Face-to-face encounter (required for Medicare/Medicaid patients)   Complete by: As directed    I Sheila Reynolds certify that this patient is under my care and that I, or a nurse practitioner or physician's assistant working with me, had a face-to-face encounter that meets the physician face-to-face encounter requirements with this patient on 08/11/2023. The encounter with the patient was in whole, or in part for the following medical condition(s) which is the primary reason for home health care (List medical condition): PNEUMONIA   The encounter with the patient was in whole, or in part, for the following medical condition, which is the primary reason for home health care: PNEUMONIA   I certify that, based on my findings, the following services are medically necessary home health services: Physical therapy   Reason for Medically Necessary Home Health Services: Therapy- Investment banker, operational, Patent examiner   My clinical findings support the need for the above services: Unsafe ambulation due to balance issues   Further, I certify that my clinical findings support that this patient is homebound due to: Unable to leave home safely without assistance   Home Health   Complete by: As directed    To provide the following care/treatments:  PT OT     Increase activity slowly   Complete by: As directed       Allergies as of 08/11/2023       Reactions   Crestor [rosuvastatin Calcium ] Other (See Comments)   muscle  aches   Demerol  [meperidine Hcl]    Other reaction(s): Hallucinations   Shellfish Allergy Itching, Other (See Comments)   Crab, shrimp and lobster ---lips itch and tingle Was told not to eat again after having a allergy test. Lobster, crab and shrimp        Medication List     STOP taking these medications    Anoro Ellipta  62.5-25 MCG/ACT Aepb Generic drug: umeclidinium-vilanterol   cetirizine  10 MG tablet Commonly known as: ZyrTEC  Allergy   furosemide  40 MG tablet Commonly known as: LASIX    predniSONE  10 MG tablet Commonly known as: DELTASONE        TAKE these medications    Accu-Chek Aviva Plus w/Device Kit Use to check blood sugars 3 times a day. Dx code e11.65   Accu-Chek Guide w/Device Kit USE AS NEEDED TO CHECK BLOOD SUGARS.   acetaminophen  500 MG tablet Commonly known as: TYLENOL  Take 1,000 mg by mouth 2 (two) times daily as needed for moderate pain or headache.   albuterol  108 (90 Base) MCG/ACT inhaler Commonly known as: VENTOLIN  HFA Inhale 2 puffs into the lungs every 6 (six) hours as needed for wheezing or shortness of breath.   albuterol  (2.5 MG/3ML) 0.083% nebulizer solution Commonly known as: PROVENTIL  Take 3 mLs (2.5 mg total) by  nebulization daily as needed for wheezing or shortness of breath.   amoxicillin -clavulanate 875-125 MG tablet Commonly known as: AUGMENTIN  Take 1 tablet by mouth 2 (two) times daily.   aspirin  EC 81 MG tablet Take 81 mg by mouth daily. Swallow whole.   atorvastatin  80 MG tablet Commonly known as: LIPITOR Take 1 tablet (80 mg total) by mouth daily.   Azathioprine 75 MG Tabs Take 1 tablet by mouth daily.   brimonidine  0.1 % Soln Commonly known as: ALPHAGAN  P Place 1 drop into both eyes in the morning, at noon, and at bedtime.   brinzolamide  1 % ophthalmic suspension Commonly known as: AZOPT  Place 1 drop into both eyes 3 (three) times daily.   carvedilol  12.5 MG tablet Commonly known as: COREG  Take 1 tablet  (12.5 mg total) by mouth 2 (two) times daily with a meal.   doxycycline  100 MG tablet Commonly known as: VIBRA -TABS Take 1 tablet (100 mg total) by mouth 2 (two) times daily for 4 days.   EPINEPHrine  0.3 mg/0.3 mL Soaj injection Commonly known as: EPI-PEN Inject 0.3 mg into the muscle as needed for anaphylaxis.   ezetimibe  10 MG tablet Commonly known as: ZETIA  Take 1 tablet (10 mg total) by mouth daily.   isosorbide  mononitrate 60 MG 24 hr tablet Commonly known as: IMDUR  TAKE 1 TABLET(60 MG) BY MOUTH DAILY   Janumet  50-500 MG tablet Generic drug: sitaGLIPtin -metformin  TAKE 1 TABLET BY MOUTH TWICE DAILY WITH A MEAL   ketorolac  0.5 % ophthalmic solution Commonly known as: ACULAR  Place 1 drop into the right eye 2 (two) times daily.   montelukast  10 MG tablet Commonly known as: SINGULAIR  TAKE 1 TABLET BY MOUTH EVERY DAY   nitroGLYCERIN  0.4 MG SL tablet Commonly known as: Nitrostat  Place 1 tablet (0.4 mg total) under the tongue every 5 (five) minutes as needed for chest pain.   OneTouch Delica Plus Lancet33G Misc CHECK BLOOD SUGAR BEFORE BREAKFAST AND DINNER   pantoprazole  40 MG tablet Commonly known as: PROTONIX  Take 1 tablet (40 mg total) by mouth daily.   prednisoLONE acetate 1 % ophthalmic suspension Commonly known as: PRED FORTE Place 1 drop into both eyes in the morning, at noon, and at bedtime. Place 2 Drops in Left Eye, Place 3 Drops In Right Eye TID   pregabalin  75 MG capsule Commonly known as: LYRICA  TAKE 1 CAPSULE(75 MG) BY MOUTH TWICE DAILY   Refresh 1.4-0.6 % Soln Generic drug: Polyvinyl Alcohol -Povidone PF Place 1-2 drops into both eyes 3 (three) times daily as needed (dry/irritated eyes.).        Follow-up Information     [facility] Follow up.   Why: Physical and Occuptational Therapy-office to call with visit times. Please call [place] at [phone] if you have additional questions. Contact information: [address]  [place] [address] [phone]         Sheila Medici, MD Follow up.   Specialty: Internal Medicine Contact information: [address] [place] [address] [phone]                Allergies  Allergen Reactions   Crestor [Rosuvastatin Calcium ] Other (See Comments)    muscle aches   Demerol  [Meperidine Hcl]     Other reaction(s): Hallucinations   Shellfish Allergy Itching and Other (See Comments)    Crab, shrimp and lobster ---lips itch and tingle Was told not to eat again after having a allergy test. Lobster, crab and shrimp  Consultations: NONE   Procedures/Studies: ECHOCARDIOGRAM COMPLETE Result Date: 08/11/2023    ECHOCARDIOGRAM REPORT   Patient Name:   CHISTINE DEMATTEO Date of Exam: 08/11/2023 Medical Rec #:  [id number]          Height:       62.0 in Accession #:    [id number]         Weight:       167.0 lb Date of Birth:  1946-01-08          BSA:          1.771 m Patient Age:    78 years           BP:           172/88 mmHg Patient Gender: F                  HR:           74 bpm. Exam Location:  Inpatient Procedure: 2D Echo, Cardiac Doppler and Color Doppler (Both Spectral and Color            Flow Doppler were utilized during procedure). Indications:    Syncope R55  History:        Patient has prior history of Echocardiogram examinations, most                 recent 12/04/2022. CHF, Angina and CAD, COPD, Arrythmias:PVC,                 Signs/Symptoms:Chest Pain and Syncope; Risk                 Factors:Hypertension, Sleep Apnea, Dyslipidemia and Diabetes.  Sonographer:    Thea Norlander RCS Referring Phys: [phone] EKTA V PATEL IMPRESSIONS  1. Apical views could not be obtained. Left ventricular ejection fraction, by estimation, is 55 to 60%. The left ventricle has normal function. The left ventricle has no regional wall motion abnormalities. Left ventricular diastolic function could not be evaluated.  2. Right ventricular systolic function is  normal. The right ventricular size is normal.  3. The mitral valve is normal in structure. No evidence of mitral valve regurgitation. No evidence of mitral stenosis.  4. The aortic valve is normal in structure. Aortic valve regurgitation is not visualized. No aortic stenosis is present.  5. The inferior vena cava is normal in size with greater than 50% respiratory variability, suggesting right atrial pressure of 3 mmHg. Comparison(s): No significant change from prior study. Prior images reviewed side by side. FINDINGS  Left Ventricle: Apical views could not be obtained. Left ventricular ejection fraction, by estimation, is 55 to 60%. The left ventricle has normal function. The left ventricle has no regional wall motion abnormalities. The left ventricular internal cavity size was normal in size. There is no left ventricular hypertrophy. Left ventricular diastolic function could not be evaluated. Right Ventricle: The right ventricular size is normal. No increase in right ventricular wall thickness. Right ventricular systolic function is normal. Left Atrium: Left atrial size was normal in size. Right Atrium: Right atrial size was normal in size. Pericardium: There is no evidence of pericardial effusion. Mitral Valve: The mitral valve is normal in structure. No evidence of mitral valve regurgitation. No evidence of mitral valve stenosis. Tricuspid Valve: The tricuspid valve is normal in structure. Tricuspid valve regurgitation is not demonstrated. No evidence of tricuspid stenosis. Aortic Valve: The aortic valve is normal in structure. Aortic valve regurgitation is not visualized. No aortic stenosis is present.  Pulmonic Valve: The pulmonic valve was normal in structure. Pulmonic valve regurgitation is not visualized. No evidence of pulmonic stenosis. Aorta: The aortic root is normal in size and structure. Venous: The inferior vena cava is normal in size with greater than 50% respiratory variability, suggesting right  atrial pressure of 3 mmHg. IAS/Shunts: No atrial level shunt detected by color flow Doppler.  LEFT VENTRICLE PLAX 2D LVIDd:         3.60 cm LVIDs:         2.70 cm LV PW:         1.10 cm LV IVS:        1.10 cm LVOT diam:     1.90 cm LV SV:         33 LV SV Index:   19 LVOT Area:     2.84 cm  IVC IVC diam: 2.00 cm LEFT ATRIUM           Index        RIGHT ATRIUM           Index LA diam:      3.00 cm 1.69 cm/m   RA Area:     11.60 cm LA Vol (A4C): 48.7 ml 27.50 ml/m  RA Volume:   20.20 ml  11.41 ml/m  AORTIC VALVE LVOT Vmax:   63.90 cm/s LVOT Vmean:  42.400 cm/s LVOT VTI:    0.118 m  AORTA Ao Root diam: 3.00 cm Ao Asc diam:  2.90 cm  SHUNTS Systemic VTI:  0.12 m Systemic Diam: 1.90 cm Jerel Croitoru MD Electronically signed by Jerel Balding MD Signature Date/Time: 08/11/2023/2:12:00 PM    Final    VAS US  LOWER EXTREMITY VENOUS (DVT) Result Date: 08/10/2023  Lower Venous DVT Study Patient Name:  EMMETT ARNTZ  Date of Exam:   08/10/2023 Medical Rec #: [id number]           Accession #:    [id number] Date of Birth: 1945-11-12           Patient Gender: F Patient Age:   65 years Exam [facility]  [facility] Procedure:      VAS US  LOWER EXTREMITY VENOUS (DVT) Referring Phys: RIPUDEEP RAI --------------------------------------------------------------------------------  Indications: Edema.  Risk Factors: None identified. Limitations: Poor ultrasound/tissue interface. Comparison Study: No prior studies. Performing Technologist: Cordella Collet RVT  Examination Guidelines: A complete evaluation includes B-mode imaging, spectral Doppler, color Doppler, and power Doppler as needed of all accessible portions of each vessel. Bilateral testing is considered an integral part of a complete examination. Limited examinations for reoccurring indications may be performed as noted. The reflux portion of the exam is performed with the patient in reverse Trendelenburg.   +---------+---------------+---------+-----------+----------+--------------+ RIGHT    CompressibilityPhasicitySpontaneityPropertiesThrombus Aging +---------+---------------+---------+-----------+----------+--------------+ CFV      Full           Yes      Yes                                 +---------+---------------+---------+-----------+----------+--------------+ SFJ      Full                                                        +---------+---------------+---------+-----------+----------+--------------+ FV Prox  Full                                                        +---------+---------------+---------+-----------+----------+--------------+  FV Mid   Full                                                        +---------+---------------+---------+-----------+----------+--------------+ FV DistalFull                                                        +---------+---------------+---------+-----------+----------+--------------+ PFV      Full                                                        +---------+---------------+---------+-----------+----------+--------------+ POP      Full           Yes      Yes                                 +---------+---------------+---------+-----------+----------+--------------+ PTV      Full                                                        +---------+---------------+---------+-----------+----------+--------------+ PERO     Full                                                        +---------+---------------+---------+-----------+----------+--------------+   +---------+---------------+---------+-----------+----------+--------------+ LEFT     CompressibilityPhasicitySpontaneityPropertiesThrombus Aging +---------+---------------+---------+-----------+----------+--------------+ CFV      Full           Yes      Yes                                  +---------+---------------+---------+-----------+----------+--------------+ SFJ      Full                                                        +---------+---------------+---------+-----------+----------+--------------+ FV Prox  Full                                                        +---------+---------------+---------+-----------+----------+--------------+ FV Mid   Full           Yes      Yes                                 +---------+---------------+---------+-----------+----------+--------------+  FV DistalFull           Yes      Yes                                 +---------+---------------+---------+-----------+----------+--------------+ PFV      Full                                                        +---------+---------------+---------+-----------+----------+--------------+ POP      Full           Yes      Yes                                 +---------+---------------+---------+-----------+----------+--------------+ PTV      Full                                                        +---------+---------------+---------+-----------+----------+--------------+ PERO     Full                                                        +---------+---------------+---------+-----------+----------+--------------+     Summary: RIGHT: - There is no evidence of deep vein thrombosis in the lower extremity.  - No cystic structure found in the popliteal fossa.  LEFT: - There is no evidence of deep vein thrombosis in the lower extremity.  - No cystic structure found in the popliteal fossa.  *See table(s) above for measurements and observations. Electronically signed by Fonda Rim on 08/10/2023 at 4:52:24 PM.    Final    VAS US  CAROTID (at [facility] and [facility] only) Result Date: 08/10/2023 Carotid Arterial Duplex Study Patient Name:  JANUARY BERGTHOLD  Date of Exam:   08/10/2023 Medical Rec #: [id number]           Accession #:    [id number] Date of Birth: February 03, 1946            Patient Gender: F Patient Age:   42 years Exam Location:  [facility] Procedure:      VAS US  CAROTID Referring Phys: MARIO PATEL --------------------------------------------------------------------------------  Indications:       Syncope. Risk Factors:      Hypertension, hyperlipidemia, Diabetes. Comparison Study:  No prior studies. Performing Technologist: Cordella Collet RVT  Examination Guidelines: A complete evaluation includes B-mode imaging, spectral Doppler, color Doppler, and power Doppler as needed of all accessible portions of each vessel. Bilateral testing is considered an integral part of a complete examination. Limited examinations for reoccurring indications may be performed as noted.  Right Carotid Findings: +----------+--------+--------+--------+-----------------------+--------+           PSV cm/sEDV cm/sStenosisPlaque Description     Comments +----------+--------+--------+--------+-----------------------+--------+ CCA Prox  109     18              smooth and heterogenous         +----------+--------+--------+--------+-----------------------+--------+  CCA Distal62      11              smooth and heterogenous         +----------+--------+--------+--------+-----------------------+--------+ ICA Prox  42      12              smooth and heterogenous         +----------+--------+--------+--------+-----------------------+--------+ ICA Mid   72      19                                     tortuous +----------+--------+--------+--------+-----------------------+--------+ ICA Distal87      24                                     tortuous +----------+--------+--------+--------+-----------------------+--------+ ECA       61      8                                               +----------+--------+--------+--------+-----------------------+--------+ +----------+--------+-------+--------+-------------------+           PSV cm/sEDV cmsDescribeArm Pressure  (mmHG) +----------+--------+-------+--------+-------------------+ [id number]                                        +----------+--------+-------+--------+-------------------+ +---------+--------+--+--------+--+---------+ VertebralPSV cm/s60EDV cm/s14Antegrade +---------+--------+--+--------+--+---------+  Left Carotid Findings: +----------+--------+-------+--------+--------------------------------+--------+           PSV cm/sEDV    StenosisPlaque Description              Comments                   cm/s                                                    +----------+--------+-------+--------+--------------------------------+--------+ CCA Prox  129     22             smooth and heterogenous                  +----------+--------+-------+--------+--------------------------------+--------+ CCA Distal74      17             smooth and heterogenous                  +----------+--------+-------+--------+--------------------------------+--------+ ICA Prox  70      16             calcific, smooth and                                                      heterogenous                             +----------+--------+-------+--------+--------------------------------+--------+ ICA Mid   72      19                                                      +----------+--------+-------+--------+--------------------------------+--------+  ICA Distal75      23                                             tortuous +----------+--------+-------+--------+--------------------------------+--------+ ECA       52      5                                                       +----------+--------+-------+--------+--------------------------------+--------+ +----------+--------+--------+--------+-------------------+           PSV cm/sEDV cm/sDescribeArm Pressure (mmHG) +----------+--------+--------+--------+-------------------+ Subclavian108                                          +----------+--------+--------+--------+-------------------+ +---------+--------+--+--------+--+---------+ VertebralPSV cm/s83EDV cm/s23Antegrade +---------+--------+--+--------+--+---------+   Summary: Right Carotid: Velocities in the right ICA are consistent with a 1-39% stenosis. Left Carotid: Velocities in the left ICA are consistent with a 1-39% stenosis. Vertebrals: Bilateral vertebral arteries demonstrate antegrade flow. *See table(s) above for measurements and observations.  Electronically signed by Eather Popp MD on 08/10/2023 at 4:15:13 PM.    Final    MR BRAIN WO CONTRAST Result Date: 08/09/2023 CLINICAL DATA:  Mental status change, unknown cause EXAM: MRI HEAD WITHOUT CONTRAST TECHNIQUE: Multiplanar, multiecho pulse sequences of the brain and surrounding structures were obtained without intravenous contrast. COMPARISON:  CT head June 29, 25. FINDINGS: Brain: No acute infarction, hemorrhage, hydrocephalus, extra-axial collection or mass lesion. Mild to moderate for age scattered T2/FLAIR hyperintensities in the white matter, compatible with chronic microvascular ischemic disease. Vascular: Normal flow voids. Skull and upper cervical spine: Normal marrow signal. Sinuses/Orbits: Mild paranasal sinus mucosal thickening. Other: No mastoid effusions. IMPRESSION: No evidence of acute intracranial abnormality. Electronically Signed   By: Gilmore GORMAN Molt M.D.   On: 08/09/2023 19:54   CT Angio Chest/Abd/Pel for Dissection W and/or W/WO Result Date: 08/09/2023 CLINICAL DATA:  Syncope and collapse. Clinical concern for dissection or pulmonary embolism. EXAM: CT ANGIOGRAPHY CHEST, ABDOMEN AND PELVIS TECHNIQUE: Non-contrast CT of the chest was initially obtained. Multidetector CT imaging through the chest, abdomen and pelvis was performed using the standard protocol during bolus administration of intravenous contrast. Multiplanar reconstructed images and MIPs were obtained and reviewed to  evaluate the vascular anatomy. RADIATION DOSE REDUCTION: This exam was performed according to the departmental dose-optimization program which includes automated exposure control, adjustment of the mA and/or kV according to patient size and/or use of iterative reconstruction technique. CONTRAST:  OMNIPAQUE  IOHEXOL  350 MG/ML SOLN COMPARISON:  12/03/2022, 03/19/2016. FINDINGS: CTA CHEST FINDINGS Cardiovascular: The heart is normal in size and there is a small pericardial effusion. Multi-vessel coronary artery calcifications are noted. There is atherosclerotic calcification of the aorta without evidence of aneurysm. The pulmonary trunk is normal in caliber. No dissection is seen. There is no evident evidence of pulmonary embolism, however protocol is not for PE. Mediastinum/Nodes: No mediastinal, hilar, or axillary lymphadenopathy is seen. The thyroid  gland, trachea, and esophagus are within normal limits. Lungs/Pleura: Centrilobular emphysematous changes are noted in the lungs. Bronchial wall thickening is noted bilaterally. Scattered opacities in tree-in-bud nodular densities are present in the right upper lobe with a cavitary  component, not significantly changed from the prior exam. No effusion or pneumothorax is seen. Musculoskeletal: Bilateral breast implants are noted. Degenerative changes are present in the thoracic spine. No acute fracture is seen. Stable scattered sclerotic lesions are noted in the bones, likely bone islands. Review of the MIP images confirms the above findings. CTA ABDOMEN AND PELVIS FINDINGS VASCULAR Aorta: Normal caliber aorta without aneurysm, dissection, vasculitis or significant stenosis. Celiac: Patent without evidence of aneurysm, dissection, vasculitis or significant stenosis. SMA: Patent without evidence of aneurysm, dissection, vasculitis or significant stenosis. Renals: Both renal arteries are patent without evidence of aneurysm, dissection, vasculitis, fibromuscular  dysplasia or significant stenosis. IMA: Patent without evidence of aneurysm, dissection, vasculitis or significant stenosis. Inflow: Patent without evidence of aneurysm, dissection, vasculitis or significant stenosis. Veins: No obvious venous abnormality within the limitations of this arterial phase study. Review of the MIP images confirms the above findings. NON-VASCULAR Hepatobiliary: No focal liver abnormality is seen. No gallstones, gallbladder wall thickening, or biliary dilatation. Pancreas: Unremarkable. No pancreatic ductal dilatation or surrounding inflammatory changes. Spleen: Normal in size without focal abnormality. Adrenals/Urinary Tract: The adrenal glands are within normal limits. The kidneys enhance symmetrically. No renal calculus or hydronephrosis bilaterally. There is mild bladder wall thickening with perivesicular fat stranding. Stomach/Bowel: Stomach is within normal limits. Appendix appears normal. No evidence of bowel wall thickening, distention, or inflammatory changes. No free air or pneumatosis. Scattered diverticula are present along the colon without evidence of diverticulitis. Lymphatic: No abdominal or pelvic lymphadenopathy by size criteria. Reproductive: Status post hysterectomy. No adnexal masses. Other: No abdominopelvic ascites. A small fat containing umbilical hernia is noted. Musculoskeletal: Degenerative changes are present in the lumbar spine. Review of the MIP images confirms the above findings. IMPRESSION: 1. Aortic atherosclerosis without evidence of aneurysm or dissection. 2. No definite evidence of pulmonary embolism, however examination is limited due to study protocol. 3. Mild bladder wall thickening with perivesicular fat stranding, which may be infectious or inflammatory. 4. Scattered opacities with cavitary lesion in the right upper lobe, not significantly changed from the prior exam. Additional tree-in-bud nodules are noted in this region, which may be infectious or  inflammatory. The possibility of underlying malignancy cannot be excluded. Continued surveillance or PET-CT may be beneficial for further evaluation. 5. Emphysema. Electronically Signed   By: Leita Birmingham M.D.   On: 08/09/2023 16:48   DG Forearm Left Result Date: 08/09/2023 CLINICAL DATA:  fall EXAM: LEFT FOREARM - 2 VIEW COMPARISON:  None Available. FINDINGS: No acute fracture or dislocation. There is no evidence of arthropathy or other focal bone abnormality. Soft tissues are unremarkable. IMPRESSION: No acute fracture or dislocation. Electronically Signed   By: Rogelia Myers M.D.   On: 08/09/2023 14:49   DG Chest Port 1 View Result Date: 08/09/2023 CLINICAL DATA:  [id number] Suspected sepsis [id number] EXAM: PORTABLE CHEST - 1 VIEW COMPARISON:  07/21/2023 FINDINGS: Lower lung volumes with some increased prominence of bibasilar interstitial markings, no confluent airspace disease. Heart size and mediastinal contours are within normal limits. Aortic Atherosclerosis (ICD10-170.0). Mild blunting of lateral costophrenic angles. Visualized bones unremarkable. IMPRESSION: Lower lung volumes with increased bibasilar interstitial markings. Electronically Signed   By: JONETTA Faes M.D.   On: 08/09/2023 10:26   CT Head Wo Contrast Result Date: 08/09/2023 CLINICAL DATA:  Patient found on floor by family. Possible fall. Nausea and vomiting last night EXAM: CT HEAD WITHOUT CONTRAST CT CERVICAL SPINE WITHOUT CONTRAST TECHNIQUE: Multidetector CT imaging of the head and cervical spine was  performed following the standard protocol without intravenous contrast. Multiplanar CT image reconstructions of the cervical spine were also generated. RADIATION DOSE REDUCTION: This exam was performed according to the departmental dose-optimization program which includes automated exposure control, adjustment of the mA and/or kV according to patient size and/or use of iterative reconstruction technique. COMPARISON:  CT head and cervical  spine 12/02/2022 FINDINGS: CT HEAD FINDINGS Brain: Ventricles, cisterns and other CSF spaces are normal. 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 No mass, mass effect, shift of midline structures or acute hemorrhage. No definite acute infarction. Vascular: No hyperdense vessel or unexpected calcification. Skull: Normal. Negative for fracture or focal lesion. Sinuses/Orbits: Orbits are normal and symmetric. Mild opacification over the ethmoid air cells as remaining paranasal sinuses are clear. Mastoid air cells are clear. Other: None. CT CERVICAL SPINE FINDINGS Alignment: Normal. Skull base and vertebrae: Vertebral body heights are normal. There is mild spondylosis of the cervical spine with mild uncovertebral joint spurring and facet arthropathy. No acute fracture or subluxation. Atlantoaxial articulation is unremarkable. Soft tissues and spinal canal: No prevertebral fluid or swelling. No visible canal hematoma. Disc levels: No significant disc space narrowing. No obvious traumatic disc herniation. Upper chest: Stable focal opacification over the right apex previously evaluated by chest CT 01/23/2023. Other: None. IMPRESSION: 1. No acute intracranial findings. 2. 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 3. No acute findings in the cervical spine. Mild spondylosis of the cervical spine. 4. Stable chronic focal opacification over the right apex previously evaluated by chest CT 01/23/2023. Electronically Signed   By: Toribio Agreste M.D.   On: 08/09/2023 10:22   CT Cervical Spine Wo Contrast Result Date: 08/09/2023 CLINICAL DATA:  Patient found on floor by family. Possible fall. Nausea and vomiting last night EXAM: CT HEAD WITHOUT CONTRAST CT CERVICAL SPINE WITHOUT CONTRAST TECHNIQUE: Multidetector CT imaging of the head and cervical spine was performed following the  standard protocol without intravenous contrast. Multiplanar CT image reconstructions of the cervical spine were also generated. RADIATION DOSE REDUCTION: This exam was performed according to the departmental dose-optimization program which includes automated exposure control, adjustment of the mA and/or kV according to patient size and/or use of iterative reconstruction technique. COMPARISON:  CT head and cervical spine 12/02/2022 FINDINGS: CT HEAD FINDINGS Brain: Ventricles, cisterns and other CSF spaces are normal. 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 No mass, mass effect, shift of midline structures or acute hemorrhage. No definite acute infarction. Vascular: No hyperdense vessel or unexpected calcification. Skull: Normal. Negative for fracture or focal lesion. Sinuses/Orbits: Orbits are normal and symmetric. Mild opacification over the ethmoid air cells as remaining paranasal sinuses are clear. Mastoid air cells are clear. Other: None. CT CERVICAL SPINE FINDINGS Alignment: Normal. Skull base and vertebrae: Vertebral body heights are normal. There is mild spondylosis of the cervical spine with mild uncovertebral joint spurring and facet arthropathy. No acute fracture or subluxation. Atlantoaxial articulation is unremarkable. Soft tissues and spinal canal: No prevertebral fluid or swelling. No visible canal hematoma. Disc levels: No significant disc space narrowing. No obvious traumatic disc herniation. Upper chest: Stable focal opacification over the right apex previously evaluated by chest CT 01/23/2023. Other: None. IMPRESSION: 1. No acute intracranial findings. 2. 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 3. No acute findings in the  cervical spine. Mild spondylosis of the cervical spine. 4. Stable chronic focal opacification over the right apex previously  evaluated by chest CT 01/23/2023. Electronically Signed   By: Toribio Agreste M.D.   On: 08/09/2023 10:22   DG Chest 2 View Result Date: 07/21/2023 CLINICAL DATA:  Productive cough. EXAM: CHEST - 2 VIEW COMPARISON:  December 07, 2022. FINDINGS: The heart size and mediastinal contours are within normal limits. Both lungs are clear. The visualized skeletal structures are unremarkable. IMPRESSION: No active cardiopulmonary disease. Electronically Signed   By: Lynwood Landy Raddle M.D.   On: 07/21/2023 15:25   (Echo, Carotid, EGD, Colonoscopy, ERCP)    Subjective:  No new c/o no issues overnight Anxious to go home Discharge Exam: Vitals:   08/11/23 0900 08/11/23 1250  BP: (!) 162/75 (!) 145/88  Pulse: 85 76  Resp:  16  Temp: (!) 97.5 F (36.4 C) 97.9 F (36.6 C)  SpO2: 96% 99%   Vitals:   08/10/23 2358 08/11/23 0513 08/11/23 0900 08/11/23 1250  BP: (!) 151/95 (!) 172/88 (!) 162/75 (!) 145/88  Pulse: 85  85 76  Resp:    16  Temp: 97.9 F (36.6 C) 98.4 F (36.9 C) (!) 97.5 F (36.4 C) 97.9 F (36.6 C)  TempSrc: Oral Oral Oral Oral  SpO2: 97%  96% 99%    General: Pt is alert, awake, not in acute distress Cardiovascular: RRR, S1/S2 +, no rubs, no gallops Respiratory: CTA bilaterally, no wheezing, no rhonchi Abdominal: Soft, NT, ND, bowel sounds + Extremities: no edema, no cyanosis    The results of significant diagnostics from this hospitalization (including imaging, microbiology, ancillary and laboratory) are listed below for reference.     Microbiology: Recent Results (from the past 240 hours)  Culture, blood (Routine x 2)     Status: None (Preliminary result)   Collection Time: 08/09/23  8:12 AM   Specimen: BLOOD RIGHT ARM  Result Value Ref Range Status   Specimen Description BLOOD RIGHT ARM  Final   Special Requests   Final    BOTTLES DRAWN AEROBIC AND ANAEROBIC Blood Culture results may not be optimal due to an inadequate volume of blood received in culture bottles    Culture   Final    NO GROWTH 2 DAYS Performed at [facility], [address] [place], [place] [address]    Report Status PENDING  Incomplete  Urine Culture     Status: Abnormal   Collection Time: 08/09/23  8:12 AM   Specimen: Urine, Random  Result Value Ref Range Status   Specimen Description URINE, RANDOM  Final   Special Requests   Final    NONE Reflexed from [id number] Performed at [facility], [address], [place], [place] [address]    Culture >=100,000 COLONIES/mL PROTEUS MIRABILIS (A)  Final   Report Status 08/11/2023 FINAL  Final   Organism ID, Bacteria PROTEUS MIRABILIS (A)  Final      Susceptibility   Proteus mirabilis - MIC*    AMPICILLIN <=2 SENSITIVE Sensitive     CEFAZOLIN  <=4 SENSITIVE Sensitive     CEFEPIME <=0.12 SENSITIVE Sensitive     CEFTRIAXONE  <=0.25 SENSITIVE Sensitive     CIPROFLOXACIN  <=0.25 SENSITIVE Sensitive     GENTAMICIN  <=1 SENSITIVE Sensitive     IMIPENEM 2 SENSITIVE Sensitive     NITROFURANTOIN  128 RESISTANT Resistant     TRIMETH/SULFA <=20 SENSITIVE Sensitive     AMPICILLIN/SULBACTAM <=2 SENSITIVE Sensitive  PIP/TAZO <=4 SENSITIVE Sensitive ug/mL    * >=100,000 COLONIES/mL PROTEUS MIRABILIS  Resp panel by RT-PCR (RSV, Flu A&B, Covid) Anterior Nasal Swab     Status: None   Collection Time: 08/09/23  1:24 PM   Specimen: Anterior Nasal Swab  Result Value Ref Range Status   SARS Coronavirus 2 by RT PCR NEGATIVE NEGATIVE Final   Influenza A by PCR NEGATIVE NEGATIVE Final   Influenza B by PCR NEGATIVE NEGATIVE Final    Comment: (NOTE) The Xpert Xpress SARS-CoV-2/FLU/RSV plus assay is intended as an aid in the diagnosis of influenza from Nasopharyngeal swab specimens and should not be used as a sole basis for treatment. Nasal washings and aspirates are unacceptable for Xpert Xpress SARS-CoV-2/FLU/RSV testing.  Fact Sheet for Patients: BloggerCourse.com  Fact Sheet for Healthcare  Providers: SeriousBroker.it  This test is not yet approved or cleared by the [place]  FDA and has been authorized for detection and/or diagnosis of SARS-CoV-2 by FDA under an Emergency Use Authorization (EUA). This EUA will remain in effect (meaning this test can be used) for the duration of the COVID-19 declaration under Section 564(b)(1) of the Act, 21 U.S.C. section 360bbb-3(b)(1), unless the authorization is terminated or revoked.     Resp Syncytial Virus by PCR NEGATIVE NEGATIVE Final    Comment: (NOTE) Fact Sheet for Patients: BloggerCourse.com  Fact Sheet for Healthcare Providers: SeriousBroker.it  This test is not yet approved or cleared by the [place]  FDA and has been authorized for detection and/or diagnosis of SARS-CoV-2 by FDA under an Emergency Use Authorization (EUA). This EUA will remain in effect (meaning this test can be used) for the duration of the COVID-19 declaration under Section 564(b)(1) of the Act, 21 U.S.C. section 360bbb-3(b)(1), unless the authorization is terminated or revoked.  Performed at [facility], [address], [place], [place] [address]      Labs: BNP (last 3 results) Recent Labs    12/02/22 2123  BNP 52.3   Basic Metabolic Panel: Recent Labs  Lab 08/09/23 0812 08/09/23 1304 08/10/23 0628 08/11/23 0519  NA 135 136 134* 137  K 3.8 4.1 3.4* 4.3  CL 100 100 100 105  CO2 22 23 25 25   GLUCOSE 147* 133* 136* 134*  BUN 16 15 11  7*  CREATININE 0.81 0.77 0.73 0.63  CALCIUM  9.3 8.9 8.3* 8.5*  PHOS  --   --  2.7 2.7   Liver Function Tests: Recent Labs  Lab 08/09/23 0812 08/09/23 1304 08/10/23 0628 08/11/23 0519  AST 22 34  --   --   ALT 20 24  --   --   ALKPHOS 68 65  --   --   BILITOT 0.9 1.5*  --   --   PROT 7.3 6.5  --   --   ALBUMIN 3.9 3.5 3.1* 3.2*   No results for input(s): LIPASE, AMYLASE in the last 168 hours. No  results for input(s): AMMONIA in the last 168 hours. CBC: Recent Labs  Lab 08/09/23 0812 08/10/23 0628 08/11/23 0519  WBC 7.9 7.4 7.4  NEUTROABS 6.0  --   --   HGB 13.7 11.8* 12.0  HCT 42.3 35.8* 36.1  MCV 86.9 84.6 84.9  PLT 199 190 202   Cardiac Enzymes: Recent Labs  Lab 08/09/23 0812 08/09/23 1304  CKTOTAL 100 158   BNP: Invalid input(s): POCBNP CBG: Recent Labs  Lab 08/10/23 1211 08/10/23 1613 08/10/23 2127 08/11/23 0925 08/11/23 1244  GLUCAP 132* 119* 143* 203*  84   D-Dimer Recent Labs    08/09/23 1304  DDIMER 3.74*   Hgb A1c Recent Labs    08/10/23 1354  HGBA1C 6.2*   Lipid Profile Recent Labs    08/10/23 0500  CHOL 140  HDL 50  LDLCALC 78  TRIG 62  CHOLHDL 2.8   Thyroid  function studies No results for input(s): TSH, T4TOTAL, T3FREE, THYROIDAB in the last 72 hours.  Invalid input(s): FREET3 Anemia work up No results for input(s): VITAMINB12, FOLATE, FERRITIN, TIBC, IRON, RETICCTPCT in the last 72 hours. Urinalysis    Component Value Date/Time   COLORURINE YELLOW 08/09/2023 0812   APPEARANCEUR HAZY (A) 08/09/2023 0812   LABSPEC 1.013 08/09/2023 0812   PHURINE 6.0 08/09/2023 0812   GLUCOSEU NEGATIVE 08/09/2023 0812   HGBUR SMALL (A) 08/09/2023 0812   BILIRUBINUR NEGATIVE 08/09/2023 0812   BILIRUBINUR SMALL 04/15/2023 1738   KETONESUR NEGATIVE 08/09/2023 0812   PROTEINUR NEGATIVE 08/09/2023 0812   UROBILINOGEN 1.0 04/15/2023 1738   UROBILINOGEN 1.0 02/09/2011 1906   NITRITE NEGATIVE 08/09/2023 0812   LEUKOCYTESUR LARGE (A) 08/09/2023 0812   Sepsis Labs Recent Labs  Lab 08/09/23 0812 08/10/23 0628 08/11/23 0519  WBC 7.9 7.4 7.4   Microbiology Recent Results (from the past 240 hours)  Culture, blood (Routine x 2)     Status: None (Preliminary result)   Collection Time: 08/09/23  8:12 AM   Specimen: BLOOD RIGHT ARM  Result Value Ref Range Status   Specimen Description BLOOD RIGHT ARM  Final    Special Requests   Final    BOTTLES DRAWN AEROBIC AND ANAEROBIC Blood Culture results may not be optimal due to an inadequate volume of blood received in culture bottles   Culture   Final    NO GROWTH 2 DAYS Performed at [facility], [address] [place], [place] [address]    Report Status PENDING  Incomplete  Urine Culture     Status: Abnormal   Collection Time: 08/09/23  8:12 AM   Specimen: Urine, Random  Result Value Ref Range Status   Specimen Description URINE, RANDOM  Final   Special Requests   Final    NONE Reflexed from [id number] Performed at [facility], [address] [place], [place] [address]    Culture >=100,000 COLONIES/mL PROTEUS MIRABILIS (A)  Final   Report Status 08/11/2023 FINAL  Final   Organism ID, Bacteria PROTEUS MIRABILIS (A)  Final      Susceptibility   Proteus mirabilis - MIC*    AMPICILLIN <=2 SENSITIVE Sensitive     CEFAZOLIN  <=4 SENSITIVE Sensitive     CEFEPIME <=0.12 SENSITIVE Sensitive     CEFTRIAXONE  <=0.25 SENSITIVE Sensitive     CIPROFLOXACIN  <=0.25 SENSITIVE Sensitive     GENTAMICIN  <=1 SENSITIVE Sensitive     IMIPENEM 2 SENSITIVE Sensitive     NITROFURANTOIN  128 RESISTANT Resistant     TRIMETH/SULFA <=20 SENSITIVE Sensitive     AMPICILLIN/SULBACTAM <=2 SENSITIVE Sensitive     PIP/TAZO <=4 SENSITIVE Sensitive ug/mL    * >=100,000 COLONIES/mL PROTEUS MIRABILIS  Resp panel by RT-PCR (RSV, Flu A&B, Covid) Anterior Nasal Swab     Status: None   Collection Time: 08/09/23  1:24 PM   Specimen: Anterior Nasal Swab  Result Value Ref Range Status   SARS Coronavirus 2 by RT PCR NEGATIVE NEGATIVE Final   Influenza A by PCR NEGATIVE NEGATIVE Final   Influenza B by PCR NEGATIVE NEGATIVE Final    Comment: (NOTE)  The Xpert Xpress SARS-CoV-2/FLU/RSV plus assay is intended as an aid in the diagnosis of influenza from Nasopharyngeal swab specimens and should not be used as a sole basis for treatment. Nasal washings and aspirates are  unacceptable for Xpert Xpress SARS-CoV-2/FLU/RSV testing.  Fact Sheet for Patients: BloggerCourse.com  Fact Sheet for Healthcare Providers: SeriousBroker.it  This test is not yet approved or cleared by the [place]  FDA and has been authorized for detection and/or diagnosis of SARS-CoV-2 by FDA under an Emergency Use Authorization (EUA). This EUA will remain in effect (meaning this test can be used) for the duration of the COVID-19 declaration under Section 564(b)(1) of the Act, 21 U.S.C. section 360bbb-3(b)(1), unless the authorization is terminated or revoked.     Resp Syncytial Virus by PCR NEGATIVE NEGATIVE Final    Comment: (NOTE) Fact Sheet for Patients: BloggerCourse.com  Fact Sheet for Healthcare Providers: SeriousBroker.it  This test is not yet approved or cleared by the [place]  FDA and has been authorized for detection and/or diagnosis of SARS-CoV-2 by FDA under an Emergency Use Authorization (EUA). This EUA will remain in effect (meaning this test can be used) for the duration of the COVID-19 declaration under Section 564(b)(1) of the Act, 21 U.S.C. section 360bbb-3(b)(1), unless the authorization is terminated or revoked.  Performed at [facility], [address] [place], [place] [address]      Time coordinating discharge: 39 minutes  SIGNED:   [name], MD  [facility] 08/11/2023, 3:52 PM

## 2023-08-11 NOTE — TOC Initial Note (Signed)
 Transition of Care [facility]) - Initial/Assessment Note    Patient Details  Name: Sheila Reynolds MRN: [id number] Date of Birth: 01-02-1946  Transition of Care [facility]) CM/SW Contact:    Sudie Erminio Deems, RN Phone Number: 08/11/2023, 12:04 PM  Clinical Narrative:  Patient presented for syncope. PTA patient was from home with spouse and support of children and grandchildren. PT/OT recommendations are for [facility]. Case Manager discussed home health with the patient and she declines the previous agency- patient does not have an agency choice. Patient is agreeable to [facility]. Referral submitted to [facility] and start of care to begin within 24-48 hours post transition home. No further home needs identified at this time.                Expected Discharge Plan: Home w Home Health Services Barriers to Discharge: No Barriers Identified   Patient Goals and CMS Choice Patient states their goals for this hospitalization and ongoing recovery are:: plans to return home with home health services   Choice offered to / list presented to : Patient (Patient did not have a preference-stated who she did not want)      Expected Discharge Plan and Services In-house Referral: NA Discharge Planning Services: CM Consult Post Acute Care Choice: Home Health Living arrangements for the past 2 months: Single Family Home        HH Arranged: PT, OT HH Agency: [facility] Date [facility] Agency Contacted: 08/11/23 Time HH Agency Contacted: 1203 Representative spoke with at [facility] Agency: [place]  Prior Living Arrangements/Services Living arrangements for the past 2 months: Single Family Home Lives with:: Spouse Patient language and need for interpreter reviewed:: Yes Do you feel safe going back to the place where you live?: Yes      Need for Family Participation in Patient Care: Yes (Comment) Care giver support system in place?: Yes (comment)   Criminal Activity/Legal  Involvement Pertinent to Current Situation/Hospitalization: No - Comment as needed  Permission Sought/Granted Permission sought to share information with : Family Supports, Case Manager Permission granted to share information with : Yes, Verbal Permission Granted     Permission granted to share info w AGENCY: [facility]     Emotional Assessment Appearance:: Appears stated age Attitude/Demeanor/Rapport: Engaged Affect (typically observed): Appropriate Orientation: : Oriented to Place, Oriented to Self, Oriented to  Time, Oriented to Situation Alcohol  / Substance Use: Not Applicable Psych Involvement: No (comment)  Admission diagnosis:  Syncope and collapse [R55] Disorientation [R41.0] Acute cystitis without hematuria [N30.00] Community acquired pneumonia, unspecified laterality [J18.9] Patient Active Problem List   Diagnosis Date Noted   Syncope and collapse 08/09/2023   Interstitial lung disease (HCC) 06/09/2023   Myalgia 04/28/2023   Near syncope 04/15/2023   Viral gastroenteritis 04/15/2023   Postconcussion syndrome 12/16/2022   Syncope 12/03/2022   Acute lower UTI 12/03/2022   Cavitating mass in right upper lung lobe 12/03/2022   Dyslipidemia 12/03/2022   Coronary artery disease 12/03/2022   Chronic obstructive pulmonary disease (COPD) (HCC) 12/03/2022   Cavitary pneumonia 12/03/2022   Atherosclerosis of aorta (HCC) 10/05/2022   Left leg pain 10/05/2022   Pain of toe of left foot 10/05/2022   Current moderate episode of major depressive disorder, unspecified whether recurrent (HCC) 09/22/2022   Abdominal hernia 05/15/2022   Candidal vulvovaginitis 05/15/2022   Fracture of foot 05/15/2022   Hearing loss 05/15/2022   Hypertensive heart disease without heart failure 05/15/2022   Menopausal syndrome 05/15/2022   Other long  term (current) drug therapy 05/15/2022   Postherpetic trigeminal neuralgia 05/15/2022   Swollen abdomen 05/15/2022   Type 2 diabetes mellitus with  hyperglycemia (HCC) 05/15/2022   Urinary incontinence 05/15/2022   Partial symptomatic epilepsy with complex partial seizures, not intractable, without status epilepticus (HCC) 05/15/2022   Diabetic polyneuropathy associated with type 2 diabetes mellitus (HCC) 12/15/2021   Oxygen  dependent 12/15/2021   History of fall 12/15/2021   Chronic arthritis 12/15/2021   Dyslipidemia associated with type 2 diabetes mellitus (HCC) 11/05/2021   Generalized weakness 11/05/2021   Pneumonitis 11/05/2021   Postnasal drip 10/18/2021   Low hemoglobin 10/18/2021   Positive ANA (antinuclear antibody) 10/18/2021   BMI 29.0-29.9,adult 10/18/2021   IPF (idiopathic pulmonary fibrosis) (HCC) 08/20/2021   Urinary frequency 06/19/2021   Urinary tract infection without hematuria 06/19/2021   Imbalance 06/19/2021   Memory changes 06/19/2021   Drug therapy 06/19/2021   Back pain 05/24/2020   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 heart failure (HCC) 04/16/2020   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) (HCC) 04/13/2020   Chronic diastolic heart failure (HCC) 03/28/2020   Uncontrolled type 2 diabetes mellitus with hyperglycemia (HCC) 07/25/2019   Hiatal hernia 07/25/2019   Spontaneous ecchymoses 07/25/2019   Class 1 obesity due to excess calories with serious comorbidity and body mass index (BMI) of 30.0 to 30.9 in adult 07/25/2019   Peripheral vascular disease, unspecified (HCC) 03/23/2019   Uveitis 06/01/2017   Fibromyalgia 06/01/2017   DDD (degenerative disc disease), lumbar 06/01/2017   DDD (degenerative disc disease), thoracic 06/01/2017   History of hypercholesterolemia 06/01/2017   History of anxiety 06/01/2017   History of IBS 06/01/2017   Premature atrial contractions 03/30/2017   PVC's (premature ventricular contractions) 03/30/2017   Diabetes mellitus without complication (HCC) 01/28/2017   Angina pectoris (HCC) 01/15/2017   CAD (coronary artery  disease) 01/12/2017   Shortness of breath 12/12/2016   Nuclear sclerotic cataract of left eye 11/16/2015   Status post eye surgery 11/16/2015   Primary open angle glaucoma of left eye, moderate stage 10/31/2015   Chronic iritis of both eyes 08/22/2015   Cystoid macular edema of right eye 08/22/2015   Acute maxillary sinusitis 05/03/2014   Obstructive sleep apnea 11/04/2013   Multiple lung nodules 07/18/2012   Heart palpitations 03/11/2012   HTN (hypertension) 03/11/2012   Unspecified glaucoma 08/29/2011   Gastroesophageal reflux disease without esophagitis 07/13/2011   Sarcoidosis 02/08/2011   COPD with acute exacerbation (HCC) 02/05/2011   Hyperlipemia 09/24/2010   Atypical chest pain 09/24/2010   Leg edema 09/24/2010   PCP:  [name], MD Pharmacy:   [id number] [address], [place] - [address] AT [facility] & [address] [place] [address] Phone: [phone] Fax: [phone]  Social Drivers of Health (SDOH) Social History: SDOH Screenings   Food Insecurity: No Food Insecurity (08/10/2023)  Housing: Unknown (08/10/2023)  Transportation Needs: No Transportation Needs (08/10/2023)  Utilities: Not At Risk (08/10/2023)  Alcohol  Screen: Low Risk  (06/09/2023)  Depression (PHQ2-9): Low Risk  (06/30/2023)  Recent Concern: Depression (PHQ2-9) - Medium Risk (05/06/2023)  Financial Resource Strain: Low Risk  (06/09/2023)  Physical Activity: Insufficiently Active (06/09/2023)  Social Connections: Unknown (08/10/2023)  Stress: No Stress Concern Present (06/09/2023)  Recent Concern: Stress - Stress Concern Present (05/06/2023)  Tobacco Use: Medium Risk (08/09/2023)  Health Literacy: Adequate Health Literacy (06/09/2023)   Readmission Risk Interventions     No data  to display

## 2023-08-11 NOTE — Progress Notes (Signed)
 Echocardiogram 2D Echocardiogram has been performed.  Sheila Reynolds 08/11/2023, 11:11 AM

## 2023-08-11 NOTE — Progress Notes (Signed)
 SLP Cancellation Note  Patient Details Name: Sheila Reynolds MRN: [id number] DOB: 20-Jun-1945   Cancelled treatment:       Reason Eval/Treat Not Completed: Patient at procedure or test/unavailable. Pt currently having in-room testing. Will continue efforts to complete SLE.  Shatina Streets B. Dory, MSP, CCC-SLP Speech Language Pathologist Office: [phone]  Dory Caprice Daring 08/11/2023, 11:08 AM

## 2023-08-11 NOTE — Evaluation (Addendum)
 Speech Language Pathology Evaluation Patient Details Name: Sheila Reynolds MRN: [id number] DOB: [birth year] Today's Date: 08/11/2023 Time: 8599-8574 SLP Time Calculation (min) (ACUTE ONLY): 25 min  Problem List:  Patient Active Problem List   Diagnosis Date Noted   Syncope and collapse 08/09/2023   Interstitial lung disease (HCC) 06/09/2023   Myalgia 04/28/2023   Near syncope 04/15/2023   Viral gastroenteritis 04/15/2023   Postconcussion syndrome 12/16/2022   Syncope 12/03/2022   Acute lower UTI 12/03/2022   Cavitating mass in right upper lung lobe 12/03/2022   Dyslipidemia 12/03/2022   Coronary artery disease 12/03/2022   Chronic obstructive pulmonary disease (COPD) (HCC) 12/03/2022   Cavitary pneumonia 12/03/2022   Atherosclerosis of aorta (HCC) 10/05/2022   Left leg pain 10/05/2022   Pain of toe of left foot 10/05/2022   Current moderate episode of major depressive disorder, unspecified whether recurrent (HCC) 09/22/2022   Abdominal hernia 05/15/2022   Candidal vulvovaginitis 05/15/2022   Fracture of foot 05/15/2022   Hearing loss 05/15/2022   Hypertensive heart disease without heart failure 05/15/2022   Menopausal syndrome 05/15/2022   Other long term (current) drug therapy 05/15/2022   Postherpetic trigeminal neuralgia 05/15/2022   Swollen abdomen 05/15/2022   Type 2 diabetes mellitus with hyperglycemia (HCC) 05/15/2022   Urinary incontinence 05/15/2022   Partial symptomatic epilepsy with complex partial seizures, not intractable, without status epilepticus (HCC) 05/15/2022   Diabetic polyneuropathy associated with type 2 diabetes mellitus (HCC) 12/15/2021   Oxygen  dependent 12/15/2021   History of fall 12/15/2021   Chronic arthritis 12/15/2021   Dyslipidemia associated with type 2 diabetes mellitus (HCC) 11/05/2021   Generalized weakness 11/05/2021   Pneumonitis 11/05/2021   Postnasal drip 10/18/2021   Low hemoglobin 10/18/2021   Positive ANA (antinuclear  antibody) 10/18/2021   BMI 29.0-29.9,adult 10/18/2021   IPF (idiopathic pulmonary fibrosis) (HCC) 08/20/2021   Urinary frequency 06/19/2021   Urinary tract infection without hematuria 06/19/2021   Imbalance 06/19/2021   Memory changes 06/19/2021   Drug therapy 06/19/2021   Back pain 05/24/2020   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 heart failure (HCC) 04/16/2020   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) (HCC) 04/13/2020   Chronic diastolic heart failure (HCC) 03/28/2020   Uncontrolled type 2 diabetes mellitus with hyperglycemia (HCC) 07/25/2019   Hiatal hernia 07/25/2019   Spontaneous ecchymoses 07/25/2019   Class 1 obesity due to excess calories with serious comorbidity and body mass index (BMI) of 30.0 to 30.9 in adult 07/25/2019   Peripheral vascular disease, unspecified (HCC) 03/23/2019   Uveitis 06/01/2017   Fibromyalgia 06/01/2017   DDD (degenerative disc disease), lumbar 06/01/2017   DDD (degenerative disc disease), thoracic 06/01/2017   History of hypercholesterolemia 06/01/2017   History of anxiety 06/01/2017   History of IBS 06/01/2017   Premature atrial contractions 03/30/2017   PVC's (premature ventricular contractions) 03/30/2017   Diabetes mellitus without complication (HCC) 01/28/2017   Angina pectoris (HCC) 01/15/2017   CAD (coronary artery disease) 01/12/2017   Shortness of breath 12/12/2016   Nuclear sclerotic cataract of left eye 11/16/2015   Status post eye surgery 11/16/2015   Primary open angle glaucoma of left eye, moderate stage 10/31/2015   Chronic iritis of both eyes 08/22/2015   Cystoid macular edema of right eye 08/22/2015   Acute maxillary sinusitis 05/03/2014   Obstructive sleep apnea 11/04/2013   Multiple lung nodules 07/18/2012   Heart palpitations 03/11/2012   HTN (hypertension) 03/11/2012   Unspecified glaucoma 08/29/2011   Gastroesophageal reflux disease  without esophagitis 07/13/2011    Sarcoidosis 02/08/2011   COPD with acute exacerbation (HCC) 02/05/2011   Hyperlipemia 09/24/2010   Atypical chest pain 09/24/2010   Leg edema 09/24/2010   Past Medical History:  Past Medical History:  Diagnosis Date   Anemia    3 months ago anemic   Anxiety    on meds   Arthritis    all over (01/15/2017)   Asthma    Bronchitis with emphysema    Chest pain    Chronic bronchitis (HCC)    Coronary artery disease    a. 01/2017 she underwent orbital atherectomy/DES to the proxmal LAD and PTCA to ostial D2. 2D Echo 01/15/17 showed mild LVH, EF 60-65%, grade 1 DD.   Family history of anesthesia complication    daughter N/V   Fibromyalgia    GERD (gastroesophageal reflux disease)    on meds   Glaucoma    Heart murmur    History of hiatal hernia    Hx of echocardiogram    Echo (03/2013):  Tech limited; Mild focal basal septal hypertrophy, EF 60-65%, normal RVF   Hyperlipidemia    Hypertension    Nonsustained ventricular tachycardia (HCC)    OSA on CPAP    Premature atrial contractions    PVC (premature ventricular contraction)    a. Holter 12/16: NSR, occ PAC,PVCs   Sarcoidosis    Type II diabetes mellitus (HCC)    Past Surgical History:  Past Surgical History:  Procedure Laterality Date   BREAST SURGERY     CARDIAC CATHETERIZATION  05/04/2007   reveals overall normal left ventricular systolic function. Ejection fraction 65-70%   CATARACT EXTRACTION W/PHACO Right 07/20/2013   Procedure: CATARACT EXTRACTION PHACO AND INTRAOCULAR LENS PLACEMENT (IOC);  Surgeon: Gaither Quan, MD;  Location: [facility] OR;  Service: Ophthalmology;  Laterality: Right;   COLONOSCOPY W/ BIOPSIES AND POLYPECTOMY     COLONOSCOPY WITH PROPOFOL  N/A 09/07/2014   Procedure: COLONOSCOPY WITH PROPOFOL ;  Surgeon: Renaye Sous, MD;  Location: [facility];  Service: Endoscopy;  Laterality: N/A;   COLONOSCOPY WITH PROPOFOL  N/A 01/10/2020   Procedure: COLONOSCOPY WITH PROPOFOL ;  Surgeon: Sous Renaye, MD;  Location: [facility]  ENDOSCOPY;  Service: Endoscopy;  Laterality: N/A;   CORONARY ANGIOPLASTY WITH STENT PLACEMENT  01/15/2017   CORONARY ATHERECTOMY N/A 01/15/2017   Procedure: CORONARY ATHERECTOMY;  Surgeon: Mady Bruckner, MD;  Location: [facility];  Service: Cardiovascular;  Laterality: N/A;   CORONARY BALLOON ANGIOPLASTY N/A 01/15/2017   Procedure: CORONARY BALLOON ANGIOPLASTY;  Surgeon: Mady Bruckner, MD;  Location: [facility];  Service: Cardiovascular;  Laterality: N/A;   CORONARY PRESSURE/FFR STUDY N/A 12/12/2016   Procedure: INTRAVASCULAR PRESSURE WIRE/FFR STUDY;  Surgeon: Mady Bruckner, MD;  Location: [facility];  Service: Cardiovascular;  Laterality: N/A;   CORONARY STENT INTERVENTION N/A 01/15/2017   Procedure: CORONARY STENT INTERVENTION;  Surgeon: Mady Bruckner, MD;  Location: [facility];  Service: Cardiovascular;  Laterality: N/A;   ESOPHAGOGASTRODUODENOSCOPY (EGD) WITH PROPOFOL  N/A 09/07/2014   Procedure: ESOPHAGOGASTRODUODENOSCOPY (EGD) WITH PROPOFOL ;  Surgeon: Renaye Sous, MD;  Location: [facility];  Service: Endoscopy;  Laterality: N/A;   EXTERNAL EAR SURGERY Bilateral 1970s   tumors removed   EYE SURGERY Left 2019   cataract extraction    EYE SURGERY Right 02/09/2018   eyelid surgery    MINI SHUNT INSERTION Right 07/20/2013   Procedure: INSERTION OF GLAUCOMA FILTRATION DEVICE RIGHT EYE;  Surgeon: Gaither Quan, MD;  Location: [facility] OR;  Service: Ophthalmology;  Laterality: Right;   MITOMYCIN  C APPLICATION Right 07/20/2013   Procedure: MITOMYCIN  C APPLICATION;  Surgeon: Gaither Quan, MD;  Location: [facility] OR;  Service: Ophthalmology;  Laterality: Right;   MITOMYCIN  C APPLICATION Right 02/21/2015   Procedure: MITOMYCIN  C APPLICATION RIGHT EYE;  Surgeon: Gaither Quan, MD;  [facility] OR;  Service: Ophthalmology;  Laterality: Right;   PLACEMENT OF BREAST IMPLANTS Bilateral 1992   took all my breast tissue out; put implants in;fibrocystic breast disease     POLYPECTOMY  01/10/2020   Procedure: POLYPECTOMY;  Surgeon: Kristie Lamprey, MD;  Location: [facility];  Service: Endoscopy;;   RIGHT/LEFT HEART CATH AND CORONARY ANGIOGRAPHY N/A 12/12/2016   Procedure: RIGHT/LEFT HEART CATH AND CORONARY ANGIOGRAPHY;  Surgeon: Mady Bruckner, MD;  Location: [facility];  Service: Cardiovascular;  Laterality: N/A;   TONSILLECTOMY     TOTAL ABDOMINAL HYSTERECTOMY  1982   TRABECULECTOMY Right 02/21/2015   Procedure: TRABECULECTOMY WITH [facility] ON THE RIGHT EYE;  Surgeon: Gaither Quan, MD;  Location: [facility] OR;  Service: Ophthalmology;  Laterality: Right;   HPI:  78yo female admitted 08/09/23 with N/V after syncopal fall, possible sepsis (cystitis). PMH: shellfish allergy, hearing impaired, GERD, NSVT, PVCs, sarcoidosis, CVA, anemia, anxiety, arthritis, CAD, fibromyalgia, HLD, HTN, OSA/CPAP, DM2. MRI - no acute infarction.   Assessment / Plan / Recommendation Clinical Impression  Pt seen at bedside for skilled ST assessment of cognitive communication status. The [employer] Mental Status (SLUMS) Examination was administered. Pt scored 28/30 with point lost on mental math. This score is WFL. Speech is fully intelligible. Receptive and Expressive Language are intact. Pt reports occasional difficulty forgetting what she has gone into a room to get, but is otherwise independent at home managing finances and medications as well as household responsibilities. Pt was encouraged write appointments, make lists, etc to facilitate functional recall. She was also encouraged to notify PCP if difficulties arise upon return to normal routines. No further ST intervention recommended at this time.    SLP Assessment  SLP Recommendation/Assessment: Patient does not need any further Speech Language Pathology Services SLP Visit Diagnosis: Cognitive communication deficit (R41.841)     Assistance Recommended at Discharge  None  Functional Status Assessment Patient has not had a  recent decline in their functional status     SLP Evaluation Cognition  Overall Cognitive Status: Within Functional Limits for tasks assessed Arousal/Alertness: Awake/alert Orientation Level: Oriented X4       Comprehension  Auditory Comprehension Overall Auditory Comprehension: Appears within functional limits for tasks assessed    Expression Expression Primary Mode of Expression: Verbal Verbal Expression Overall Verbal Expression: Appears within functional limits for tasks assessed   Oral / Motor  Oral Motor/Sensory Function Overall Oral Motor/Sensory Function: Within functional limits Motor Speech Overall Motor Speech: Appears within functional limits for tasks assessed Intelligibility: Intelligible           Coren Sagan B. Dory, MSP, CCC-SLP Speech Language Pathologist Office: [phone]  Dory Caprice Daring 08/11/2023, 2:39 PM

## 2023-08-11 NOTE — Plan of Care (Signed)
 Problem: Education: Goal: Ability to describe self-care measures that may prevent or decrease complications (Diabetes Survival Skills Education) will improve Outcome: Adequate for Discharge Goal: Individualized Educational Video(s) Outcome: Adequate for Discharge   Problem: Coping: Goal: Ability to adjust to condition or change in health will improve Outcome: Adequate for Discharge   Problem: Fluid Volume: Goal: Ability to maintain a balanced intake and output will improve Outcome: Adequate for Discharge   Problem: Health Behavior/Discharge Planning: Goal: Ability to identify and utilize available resources and services will improve Outcome: Adequate for Discharge Goal: Ability to manage health-related needs will improve Outcome: Adequate for Discharge   Problem: Metabolic: Goal: Ability to maintain appropriate glucose levels will improve Outcome: Adequate for Discharge   Problem: Nutritional: Goal: Maintenance of adequate nutrition will improve Outcome: Adequate for Discharge Goal: Progress toward achieving an optimal weight will improve Outcome: Adequate for Discharge   Problem: Skin Integrity: Goal: Risk for impaired skin integrity will decrease Outcome: Adequate for Discharge   Problem: Tissue Perfusion: Goal: Adequacy of tissue perfusion will improve Outcome: Adequate for Discharge   Problem: Education: Goal: Knowledge of disease or condition will improve Outcome: Adequate for Discharge Goal: Knowledge of secondary prevention will improve (MUST DOCUMENT ALL) Outcome: Adequate for Discharge Goal: Knowledge of patient specific risk factors will improve (DELETE if not current risk factor) Outcome: Adequate for Discharge   Problem: Ischemic Stroke/TIA Tissue Perfusion: Goal: Complications of ischemic stroke/TIA will be minimized Outcome: Adequate for Discharge   Problem: Coping: Goal: Will verbalize positive feelings about self Outcome: Adequate for  Discharge Goal: Will identify appropriate support needs Outcome: Adequate for Discharge   Problem: Health Behavior/Discharge Planning: Goal: Ability to manage health-related needs will improve Outcome: Adequate for Discharge Goal: Goals will be collaboratively established with patient/family Outcome: Adequate for Discharge   Problem: Self-Care: Goal: Ability to participate in self-care as condition permits will improve Outcome: Adequate for Discharge Goal: Verbalization of feelings and concerns over difficulty with self-care will improve Outcome: Adequate for Discharge Goal: Ability to communicate needs accurately will improve Outcome: Adequate for Discharge   Problem: Nutrition: Goal: Risk of aspiration will decrease Outcome: Adequate for Discharge Goal: Dietary intake will improve Outcome: Adequate for Discharge   Problem: Acute Rehab OT Goals (only OT should resolve) Goal: Pt. Will Perform Grooming Outcome: Adequate for Discharge Goal: Pt. Will Perform Upper Body Dressing Outcome: Adequate for Discharge Goal: Pt. Will Perform Lower Body Dressing Outcome: Adequate for Discharge Goal: Pt. Will Transfer To Toilet Outcome: Adequate for Discharge   Problem: Education: Goal: Knowledge of General Education information will improve Description: Including pain rating scale, medication(s)/side effects and non-pharmacologic comfort measures Outcome: Adequate for Discharge   Problem: Health Behavior/Discharge Planning: Goal: Ability to manage health-related needs will improve Outcome: Adequate for Discharge   Problem: Clinical Measurements: Goal: Ability to maintain clinical measurements within normal limits will improve Outcome: Adequate for Discharge Goal: Will remain free from infection Outcome: Adequate for Discharge Goal: Diagnostic test results will improve Outcome: Adequate for Discharge Goal: Respiratory complications will improve Outcome: Adequate for  Discharge Goal: Cardiovascular complication will be avoided Outcome: Adequate for Discharge   Problem: Activity: Goal: Risk for activity intolerance will decrease Outcome: Adequate for Discharge   Problem: Nutrition: Goal: Adequate nutrition will be maintained Outcome: Adequate for Discharge   Problem: Coping: Goal: Level of anxiety will decrease Outcome: Adequate for Discharge   Problem: Elimination: Goal: Will not experience complications related to bowel motility Outcome: Adequate for Discharge Goal: Will not experience complications  related to urinary retention Outcome: Adequate for Discharge   Problem: Pain Managment: Goal: General experience of comfort will improve and/or be controlled Outcome: Adequate for Discharge   Problem: Safety: Goal: Ability to remain free from injury will improve Outcome: Adequate for Discharge   Problem: Skin Integrity: Goal: Risk for impaired skin integrity will decrease Outcome: Adequate for Discharge   Problem: Acute Rehab PT Goals(only PT should resolve) Goal: Pt Will Go Supine/Side To Sit Outcome: Adequate for Discharge Goal: Pt Will Go Sit To Supine/Side Outcome: Adequate for Discharge Goal: Patient Will Transfer Sit To/From Stand Outcome: Adequate for Discharge Goal: Pt Will Ambulate Outcome: Adequate for Discharge   Problem: Acute Rehab PT Goals(only PT should resolve) Goal: Pt Will Go Supine/Side To Sit Outcome: Adequate for Discharge Goal: Pt Will Go Sit To Supine/Side Outcome: Adequate for Discharge Goal: Patient Will Transfer Sit To/From Stand Outcome: Adequate for Discharge Goal: Pt Will Ambulate Outcome: Adequate for Discharge

## 2023-08-11 NOTE — Progress Notes (Signed)
 Initial Nutrition Assessment  DOCUMENTATION CODES:   Not applicable  INTERVENTION:  Glucerna Shake po TID, each supplement provides 220 kcal and 10 grams of protein  Encouraged adequate intake of calories and protein to aid in recovery while monitoring tolerance  NUTRITION DIAGNOSIS:   Inadequate oral intake related to poor appetite, nausea, vomiting, diarrhea as evidenced by estimated needs, per patient/family report.  GOAL:   Patient will meet greater than or equal to 90% of their needs  MONITOR:   PO intake, Supplement acceptance  REASON FOR ASSESSMENT:   Consult Assessment of nutrition requirement/status  ASSESSMENT:   Pt with hx  HLD, fibromyalgia, CAD, HTN, GERD, OSA on CPAP, diabetes, arthritis, and glaucoma. 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 Pt admitted following fall where she experienced syncope, workup for possible sepsis.  MD suspects syncope due to dehydration and possible pneumonia. Pt complains of dysuria, n/v, and diarrhea leading up to admission.   Spoke with pt who was awake and alert at time of assessment. Pt reports she no longer has issues with n/v or diarrhea, but appetite continues to come and go. Per chart review, diet summary documentation shows average intake of 50% between 3 recorded meals. Discussed importance of adequate intake to help pt meet calorie and protein needs while admitted. Discussed ordering Glucerna shakes for pt to help supplement intake when she feels too full to eat, pt agreeable.  Pt reports PTA she had been experiencing n/v and diarrhea for about 1 month leading up to admission. She reports she would still try to eat 1-2 x per day when she was experiencing GI symptoms but would also rely on intake of gingerale and cola. Pt reports she would eat whatever sounded like something she would tolerate, but could not provide specific details on types or amounts of foods she would eat. During this  period, pt had been experiencing cough and congestion with possible sinus infection that may have lead to pneumonia.   Pt reports no recent wt loss and nutrition focused physical exam showed no depletions of muscle or fat. Pt had BLE moderate pitting edema at time of assessment.   PTA, pt reports using a cane for mobility and required assistance with mobility.   Medications reviewed and include:  Doxycycline  SSI Novolog  TID Novolog  0-5 units daily Protonix  Ceftriaxone    Labs reviewed:  CBG over last 24 hours: 115-203mg /dL J8r 6.2  NUTRITION - FOCUSED PHYSICAL EXAM:  Flowsheet Row Most Recent Value  Orbital Region No depletion  Upper Arm Region No depletion  Thoracic and Lumbar Region No depletion  Buccal Region No depletion  Temple Region No depletion  Clavicle Bone Region No depletion  Clavicle and Acromion Bone Region No depletion  Scapular Bone Region No depletion  Dorsal Hand No depletion  Patellar Region Unable to assess  [BLE pitting edema up to knee]  Anterior Thigh Region Unable to assess  [BLE pitting edema up to knee]  Posterior Calf Region Unable to assess  [BLE pitting edema up to knee]  Edema (RD Assessment) Moderate  [BLE, pitting]  Hair Reviewed  Eyes Reviewed  Mouth Reviewed  Skin Reviewed  Nails Unable to assess  [painted]   Diet Order:   Diet Order             Diet - low sodium heart healthy           Diet Carb Modified Fluid consistency: Thin; Room service appropriate? Yes  Diet effective  now                   EDUCATION NEEDS:   Education needs have been addressed  Skin:  Skin Assessment: Reviewed RN Assessment  Last BM:  6/29  Height:   Ht Readings from Last 1 Encounters:  07/31/23 5' 2 (1.575 m)   Weight:   Wt Readings from Last 1 Encounters:  07/31/23 75.8 kg   Ideal Body Weight:  50 kg  BMI:  There is no height or weight on file to calculate BMI.  Estimated Nutritional Needs:   Kcal:  1500-1700  Protein:   65-75g  Fluid:  1.5-1.7L  [name] Glance, MS, RDN, LDN Clinical Dietitian I Please reach out via secure chat

## 2023-08-12 ENCOUNTER — Telehealth: Payer: Self-pay | Admitting: *Deleted

## 2023-08-12 NOTE — Transitions of Care (Post Inpatient/ED Visit) (Signed)
 08/12/2023  Name: Sheila Reynolds MRN: [id number] DOB: March 23, 1945  Today's TOC FU Call Status: Today's TOC FU Call Status:: Successful TOC FU Call Completed TOC FU Call Complete Date: 08/12/23 Patient's Name and Date of Birth confirmed.  Transition Care Management Follow-up Telephone Call Date of Discharge: 08/11/23 [name]) Type of Discharge: Inpatient Admission Primary Inpatient Discharge Diagnosis:: Syncope and collapse How have you been since you were released from the hospital?: Better Any questions or concerns?: No  Items Reviewed: Did you receive and understand the discharge instructions provided?: Yes Medications obtained,verified, and reconciled?: Yes (Medications Reviewed) Any new allergies since your discharge?: No Dietary orders reviewed?: Yes Type of Diet Ordered:: Heart Healthy Do you have support at home?: Yes People in Home [RPT]: spouse Name of Support/Comfort Primary Source: Spouse/Christopher  Medications Reviewed Today: Medications Reviewed Today     Reviewed by Lucky Andrea LABOR, RN (Registered Nurse) on 08/12/23 at 1016  Med List Status: <None>   Medication Order Taking? Sig Documenting Provider Last Dose Status Informant  acetaminophen  (TYLENOL ) 500 MG tablet [id number] Yes Take 1,000 mg by mouth 2 (two) times daily as needed for moderate pain or headache. [provider]  Active Self, Spouse/Significant Other  albuterol  (PROVENTIL ) (2.5 MG/3ML) 0.083% nebulizer solution [id number]  Take 3 mLs (2.5 mg total) by nebulization daily as needed for wheezing or shortness of breath.  Patient not taking: Reported on 08/12/2023   Georgina Speaks, FNP  Active Self, Spouse/Significant Other  albuterol  (VENTOLIN  HFA) 108 (90 Base) MCG/ACT inhaler [id number] Yes Inhale 2 puffs into the lungs every 6 (six) hours as needed for wheezing or shortness of breath. [name], MD  Active Self, Spouse/Significant Other   amoxicillin -clavulanate (AUGMENTIN ) 875-125 MG tablet [id number] Yes Take 1 tablet by mouth 2 (two) times daily. Will [name], MD  Active   aspirin  EC 81 MG tablet [id number]  Take 81 mg by mouth daily. Swallow whole.  Patient not taking: Reported on 08/12/2023   [provider]  Active Self, Spouse/Significant Other           Med Note (MENDOZA MENDEZ, CARLOS A   Thu Apr 19, 2020 11:14 AM)    atorvastatin  (LIPITOR) 80 MG tablet [id number] Yes Take 1 tablet (80 mg total) by mouth daily. [name], NP  Active Self, Spouse/Significant Other  Azathioprine 75 MG TABS [id number] Yes Take 1 tablet by mouth daily. [provider]  Active Self, Spouse/Significant Other  Blood Glucose Monitoring Suppl (ACCU-CHEK AVIVA PLUS) w/Device KIT [id number] Yes Use to check blood sugars 3 times a day. Dx code e11.65 [name], MD  Consider Medication Status and Discontinue (Patient Preference) Self, Spouse/Significant Other  Blood Glucose Monitoring Suppl (ACCU-CHEK GUIDE) w/Device KIT [id number] Yes USE AS NEEDED TO CHECK BLOOD SUGARS. [name], MD  Active Self, Spouse/Significant Other  brimonidine  (ALPHAGAN  P) 0.1 % SOLN [id number] Yes Place 1 drop into both eyes in the morning, at noon, and at bedtime. [provider]  Active Self, Spouse/Significant Other  brinzolamide  (AZOPT ) 1 % ophthalmic suspension [id number] Yes Place 1 drop into both eyes 3 (three) times daily. [provider]  Active Self, Spouse/Significant Other  carvedilol  (COREG ) 12.5 MG tablet [id number] Yes Take 1 tablet (12.5 mg total) by mouth 2 (two) times daily with a meal. Raford Riggs, MD  Active Self, Spouse/Significant Other  doxycycline  (VIBRA -TABS) 100 MG tablet [id number] Yes Take 1 tablet (100 mg total) by mouth 2 (two) times daily for 4  days. Will [name], MD  Active   EPINEPHrine  0.3 mg/0.3 mL IJ SOAJ injection [id number] Yes Inject 0.3 mg into the muscle as needed for  anaphylaxis. [name], MD  Active Self, Spouse/Significant Other  ezetimibe  (ZETIA ) 10 MG tablet [id number] Yes Take 1 tablet (10 mg total) by mouth daily. [name], [name], MD  Active Self, Spouse/Significant Other  isosorbide  mononitrate (IMDUR ) 60 MG 24 hr tablet [id number] Yes TAKE 1 TABLET(60 MG) BY MOUTH DAILY Raford Riggs, MD  Active Self, Spouse/Significant Other  JANUMET  50-500 MG tablet [id number] Yes TAKE 1 TABLET BY MOUTH TWICE DAILY WITH A MEAL [name], MD  Active Self, Spouse/Significant Other  ketorolac  (ACULAR ) 0.5 % ophthalmic solution [id number] Yes Place 1 drop into the right eye 2 (two) times daily. [provider]  Active Self, Spouse/Significant Other  Lancets JANETT DELICA PLUS [place]) MISC [id number] Yes CHECK BLOOD SUGAR BEFORE BREAKFAST AND JOAQUIN Peace, Ramandeep, NP  Active Self, Spouse/Significant Other  montelukast  (SINGULAIR ) 10 MG tablet [id number] Yes TAKE 1 TABLET BY MOUTH EVERY DAY [name], MD  Active Self, Spouse/Significant Other  nitroGLYCERIN  (NITROSTAT ) 0.4 MG SL tablet [id number] Yes Place 1 tablet (0.4 mg total) under the tongue every 5 (five) minutes as needed for chest pain. [name], MD  Active Self, Spouse/Significant Other           Med Note [name] Sheila Reynolds 31, 2025 12:52 PM)    pantoprazole  (PROTONIX ) 40 MG tablet [id number] Yes Take 1 tablet (40 mg total) by mouth daily. Raford Riggs, MD  Active Self, Spouse/Significant Other  Polyvinyl Alcohol -Povidone PF (REFRESH) 1.4-0.6 % SOLN [id number] Yes Place 1-2 drops into both eyes 3 (three) times daily as needed (dry/irritated eyes.). [provider]  Active Self, Spouse/Significant Other           Med Note (LITTLE, ANGEL L   Tue Jun 09, 2023  9:35 AM)    prednisoLONE acetate (PRED FORTE) 1 % ophthalmic suspension [id number] Yes Place 1 drop into both eyes in the morning, at noon, and at bedtime. Place 2 Drops in Left Eye, Place 3 Drops  In Right Eye TID [provider]  Active Self, Spouse/Significant Other  pregabalin  (LYRICA ) 75 MG capsule [id number] Yes TAKE 1 CAPSULE(75 MG) BY MOUTH TWICE DAILY [name], MD  Active Self, Spouse/Significant Other            Home Care and Equipment/Supplies: Were Home Health Services Ordered?: Yes Name of Home Health Agency:: [facility] for PT/OT Has Agency set up a time to come to your home?: No EMR reviewed for Home Health Orders: Orders present/patient has not received call (refer to CM for follow-up) (RNCM spoke with Channing at [place] [facility] [phone] who is planning to meet with patient today.) Any new equipment or medical supplies ordered?: No  Functional Questionnaire: Do you need assistance with bathing/showering or dressing?: No Do you need assistance with meal preparation?: Yes (Patient and spouse cooks meals) Do you need assistance with eating?: No Do you have difficulty maintaining continence: No Do you need assistance with getting out of bed/getting out of a chair/moving?: No Do you have difficulty managing or taking your medications?: No  Follow up appointments reviewed: PCP Follow-up appointment confirmed?: Yes Date of PCP follow-up appointment?: 08/18/23 Follow-up Provider: Dr. Jarold [facility] Follow-up appointment confirmed?: NA Do you need transportation to your follow-up appointment?: No Do you understand care options if your condition(s) worsen?: Yes-patient verbalized understanding  SDOH Interventions Today  Flowsheet Row Most Recent Value  SDOH Interventions   Food Insecurity Interventions Intervention Not Indicated  Housing Interventions Intervention Not Indicated  Transportation Interventions Intervention Not Indicated  Utilities Interventions Intervention Not Indicated    Andrea Dimes RN, BSN [place]  [facility] RN Care Manager [phone]

## 2023-08-14 LAB — CULTURE, BLOOD (ROUTINE X 2): Culture: NO GROWTH

## 2023-08-17 ENCOUNTER — Telehealth: Payer: Self-pay | Admitting: Internal Medicine

## 2023-08-17 ENCOUNTER — Ambulatory Visit (HOSPITAL_BASED_OUTPATIENT_CLINIC_OR_DEPARTMENT_OTHER): Admitting: Physical Therapy

## 2023-08-17 ENCOUNTER — Ambulatory Visit: Payer: Self-pay | Admitting: Internal Medicine

## 2023-08-17 ENCOUNTER — Encounter: Payer: Self-pay | Admitting: Internal Medicine

## 2023-08-17 ENCOUNTER — Ambulatory Visit: Payer: Medicare Other | Admitting: Adult Health

## 2023-08-17 ENCOUNTER — Other Ambulatory Visit: Payer: Self-pay

## 2023-08-17 ENCOUNTER — Inpatient Hospital Stay: Admitting: Internal Medicine

## 2023-08-17 ENCOUNTER — Ambulatory Visit: Admitting: Internal Medicine

## 2023-08-17 VITALS — BP 118/70 | HR 78 | Temp 98.3°F | Ht 62.0 in | Wt 166.0 lb

## 2023-08-17 DIAGNOSIS — E785 Hyperlipidemia, unspecified: Secondary | ICD-10-CM

## 2023-08-17 DIAGNOSIS — R112 Nausea with vomiting, unspecified: Secondary | ICD-10-CM | POA: Diagnosis not present

## 2023-08-17 DIAGNOSIS — J189 Pneumonia, unspecified organism: Secondary | ICD-10-CM

## 2023-08-17 DIAGNOSIS — J84112 Idiopathic pulmonary fibrosis: Secondary | ICD-10-CM

## 2023-08-17 DIAGNOSIS — N3941 Urge incontinence: Secondary | ICD-10-CM | POA: Diagnosis not present

## 2023-08-17 DIAGNOSIS — E6609 Other obesity due to excess calories: Secondary | ICD-10-CM

## 2023-08-17 DIAGNOSIS — R55 Syncope and collapse: Secondary | ICD-10-CM | POA: Diagnosis not present

## 2023-08-17 DIAGNOSIS — E1169 Type 2 diabetes mellitus with other specified complication: Secondary | ICD-10-CM

## 2023-08-17 DIAGNOSIS — J4489 Other specified chronic obstructive pulmonary disease: Secondary | ICD-10-CM

## 2023-08-17 DIAGNOSIS — N3000 Acute cystitis without hematuria: Secondary | ICD-10-CM

## 2023-08-17 DIAGNOSIS — I25119 Atherosclerotic heart disease of native coronary artery with unspecified angina pectoris: Secondary | ICD-10-CM | POA: Diagnosis not present

## 2023-08-17 DIAGNOSIS — F321 Major depressive disorder, single episode, moderate: Secondary | ICD-10-CM

## 2023-08-17 DIAGNOSIS — R2689 Other abnormalities of gait and mobility: Secondary | ICD-10-CM | POA: Diagnosis not present

## 2023-08-17 DIAGNOSIS — R269 Unspecified abnormalities of gait and mobility: Secondary | ICD-10-CM

## 2023-08-17 DIAGNOSIS — R197 Diarrhea, unspecified: Secondary | ICD-10-CM

## 2023-08-17 DIAGNOSIS — D869 Sarcoidosis, unspecified: Secondary | ICD-10-CM

## 2023-08-17 DIAGNOSIS — R569 Unspecified convulsions: Secondary | ICD-10-CM

## 2023-08-17 LAB — POCT URINALYSIS DIP (CLINITEK)
Bilirubin, UA: NEGATIVE
Blood, UA: NEGATIVE
Glucose, UA: NEGATIVE mg/dL
Ketones, POC UA: NEGATIVE mg/dL
Leukocytes, UA: NEGATIVE
Nitrite, UA: NEGATIVE
POC PROTEIN,UA: NEGATIVE
Spec Grav, UA: 1.025 (ref 1.010–1.025)
Urobilinogen, UA: 0.2 U/dL
pH, UA: 5.5 (ref 5.0–8.0)

## 2023-08-17 MED ORDER — ALBUTEROL SULFATE (2.5 MG/3ML) 0.083% IN NEBU: 2.5000 mg | INHALATION_SOLUTION | Freq: Every day | RESPIRATORY_TRACT | 12 refills | Status: DC | PRN

## 2023-08-17 MED ORDER — ACCU-CHEK GUIDE TEST VI STRP: ORAL_STRIP | 1 refills | Status: AC

## 2023-08-17 MED ORDER — MONTELUKAST SODIUM 10 MG PO TABS: 10.0000 mg | ORAL_TABLET | Freq: Every day | ORAL | 1 refills | Status: DC

## 2023-08-17 NOTE — Telephone Encounter (Signed)
 Called pt and left vm changed pt appt to 7/9 @ 3:40 due to provider not being in the office 7/8

## 2023-08-17 NOTE — Telephone Encounter (Signed)
 Called pt to see if she can come in today at 30

## 2023-08-17 NOTE — Patient Instructions (Signed)
Syncope, Adult  Syncope is when you pass out or faint for a short time. It is caused by a sudden decrease in blood flow to the brain. This can happen for many reasons. It can sometimes happen when seeing blood, getting a shot (injection), or having pain or strong emotions. Most causes of fainting are not dangerous, but in some cases it can be a sign of a serious medical problem. If you faint, get help right away. Call your local emergency services (911 in the [place] Follow these instructions at home: Watch for any changes in your symptoms. Take these actions to stay safe and help with your symptoms: Knowing when you may be about to faint Signs that you may be about to faint include: Feeling dizzy or light-headed. It may feel like the room is spinning. Feeling weak. Feeling like you may vomit (nauseous). Seeing spots or seeing all white or all black. Having cold, clammy skin. Feeling warm and sweaty. Hearing ringing in the ears. If you start to feel like you might faint, sit or lie down right away. If sitting, lower your head down between your legs. If lying down, raise (elevate) your feet above the level of your heart. Breathe deeply and steadily. Wait until all of the symptoms are gone. Have someone stay with you until you feel better. Medicines Take over-the-counter and prescription medicines only as told by your doctor. If you are taking blood pressure or heart medicine, sit up and stand up slowly. Spend a few minutes getting ready to sit and then stand. This can help you feel less dizzy. Lifestyle Do not drive, use machinery, or play sports until your doctor says it is okay. Do not drink alcohol. Do not smoke or use any products that contain nicotine or tobacco. If you need help quitting, ask your doctor. Avoid hot tubs and saunas. General instructions Talk with your doctor about your symptoms. You may need to have testing to help find the cause. Drink enough fluid to keep your pee  (urine) pale yellow. Avoid standing for a long time. If you must stand for a long time, do movements such as: Moving your legs. Crossing your legs. Flexing and stretching your leg muscles. Squatting. Keep all follow-up visits. Contact a doctor if: You have episodes of near fainting. Get help right away if: You pass out or faint. You hit your head or are injured after fainting. You have any of these symptoms: Fast or uneven heartbeats (palpitations). Pain in your chest, belly, or back. Shortness of breath. You have jerky movements that you cannot control (seizure). You have a very bad headache. You are confused. You have problems with how you see (vision). You are very weak. You have trouble walking. You are bleeding from your mouth or your butt (rectum). You have black or tarry poop (stool). These symptoms may be an emergency. Get help right away. Call your local emergency services (911 in the [place] Do not wait to see if the symptoms will go away. Do not drive yourself to the hospital. Summary Syncope is when you pass out or faint for a short time. It is caused by a sudden decrease in blood flow to the brain. Signs that you may be about to faint include feeling dizzy or light-headed, feeling like you may vomit, seeing all white or all black, or having cold, clammy skin. If you start to feel like you might faint, sit or lie down right away. Lower your head if sitting, or raise (elevate)   your feet if lying down. Breathe deeply and steadily. Wait until all of the symptoms are gone. This information is not intended to replace advice given to you by your health care provider. Make sure you discuss any questions you have with your health care provider. Document Revised: 06/07/2020 Document Reviewed: 06/07/2020 Elsevier Patient Education  2024 [employer].

## 2023-08-17 NOTE — Progress Notes (Signed)
 I,Victoria T Emmitt, CMA,acting as a Neurosurgeon for Sheila LOISE Slocumb, MD.,have documented all relevant documentation on the behalf of Sheila LOISE Slocumb, MD,as directed by  Sheila LOISE Slocumb, MD while in the presence of Sheila LOISE Slocumb, MD.  Subjective:  Patient ID: Sheila Reynolds , female    DOB: 31-Jan-1946 , 78 y.o.   MRN: [id number]  Chief Complaint  Patient presents with   Hospitalization Follow-up    Patient presents today for hospital follow up. She went to [facility] on 6/29, admitted on same day. Discharged on 7/1. Today she reports feeling okay.  She wants to know what could be the main cause for these episodes.  She would like a referral to a Nephrologist that also specializes in GYN care.  She also still experiences chronic cough. She admits has not gotten much better.  She will call to reschedule mammogram.      HPI Discussed the use of AI scribe software for clinical note transcription with the patient, who gave verbal consent to proceed.  History of Present Illness The patient, with a history of coronary artery disease and sarcoidosis, presents for a follow-up after hospitalization for syncope, nausea, vomiting, and diarrhea.  She is accompanied by her husband today.  She was admitted to the hospital on June 29 and discharged on July 1 after presenting with nausea, vomiting, and diarrhea that had persisted for several days. These symptoms began after attending her sister's birthday party, where she felt fatigued, laid down, and later woke up with nausea and vomiting. She was found on the floor by her husband, around 7:30 AM, prompting a call to EMS. He was not sure if she passed out or if she just laid down because she felt so weak.  ER workup including CT brain.  A CT of the brain showed plaque in the carotid arteries, and an echocardiogram was performed. She was treated with IV fluids and diagnosed with a UTI, receiving IV Rocephin  and discharged on Augmentin , which she has  completed.  She was also treated with doxycycline .  Since her discharge, she has not experienced further nausea, vomiting, or diarrhea but continues to feel fatigued and reports a poor appetite. She states, 'Instead of eating a plate of food, I eat maybe a fourth of a plate. I can't, I just have no appetite.' Her appetite had decreased slightly before hospitalization and worsened afterward.  She has a history of coronary artery disease and is on aspirin  and Lipitor. She is also taking azathioprine  for sarcoidosis. She had a bacterial sinus infection treated with Abx and prednisone  before her sister's birthday party. In the hospital, she received a third round of antibiotics.  She reports feeling fatigued since her hospitalization, with decreased interest in activities, feeling down, depressed, or hopeless, and an altered sleep schedule. She attributes these mood changes to her recent illness and the need to rely on others for transportation, being home alone from Wednesday through Saturday night.  She also reports bladder issues, stating, 'When I get the urge to go, I don't make it.' She has not started pelvic physical therapy for this issue yet.  Her blood sugar was 6.2 in the hospital, and she was given insulin  there. She does not regularly drink Pepsi, despite having had some on the day of her sister's birthday party.  Past Medical History:  Diagnosis Date   Anemia    3 months ago anemic   Anxiety    on meds   Arthritis  all over (01/15/2017)   Asthma    Bronchitis with emphysema    Chest pain    Chronic bronchitis (HCC)    Coronary artery disease    a. 01/2017 she underwent orbital atherectomy/DES to the proxmal LAD and PTCA to ostial D2. 2D Echo 01/15/17 showed mild LVH, EF 60-65%, grade 1 DD.   Family history of anesthesia complication    daughter N/V   Fibromyalgia    GERD (gastroesophageal reflux disease)    on meds   Glaucoma    Heart murmur    History of hiatal hernia     Hx of echocardiogram    Echo (03/2013):  Tech limited; Mild focal basal septal hypertrophy, EF 60-65%, normal RVF   Hyperlipidemia    Hypertension    Nausea vomiting and diarrhea 08/24/2023   Nonsustained ventricular tachycardia (HCC)    OSA on CPAP    Premature atrial contractions    PVC (premature ventricular contraction)    a. Holter 12/16: NSR, occ PAC,PVCs   Sarcoidosis    Type II diabetes mellitus (HCC)      Family History  Problem Relation Age of Onset   Heart disease Father    Diabetes Father    Glaucoma Father    Cancer Mother        unknown type, ?lung   Heart disease Paternal Grandmother    Cancer Paternal Grandmother        unknown type   Diabetes Brother    Glaucoma Brother      Current Outpatient Medications:    acetaminophen  (TYLENOL ) 500 MG tablet, Take 1,000 mg by mouth 2 (two) times daily as needed for moderate pain or headache., Disp: , Rfl:    albuterol  (PROVENTIL ) (2.5 MG/3ML) 0.083% nebulizer solution, Take 3 mLs (2.5 mg total) by nebulization daily as needed for wheezing or shortness of breath., Disp: 75 mL, Rfl: 12   albuterol  (VENTOLIN  HFA) 108 (90 Base) MCG/ACT inhaler, Inhale 2 puffs into the lungs every 6 (six) hours as needed for wheezing or shortness of breath., Disp: 18 g, Rfl: 12   amoxicillin -clavulanate (AUGMENTIN ) 875-125 MG tablet, Take 1 tablet by mouth 2 (two) times daily., Disp: 8 tablet, Rfl: 0   aspirin  EC 81 MG tablet, Take 81 mg by mouth daily. Swallow whole. (Patient not taking: Reported on 08/12/2023), Disp: , Rfl:    atorvastatin  (LIPITOR) 80 MG tablet, Take 1 tablet (80 mg total) by mouth daily., Disp: 90 tablet, Rfl: 3   Azathioprine  75 MG TABS, Take 1 tablet by mouth daily., Disp: , Rfl:    Blood Glucose Monitoring Suppl (ACCU-CHEK AVIVA PLUS) w/Device KIT, Use to check blood sugars 3 times a day. Dx code e11.65, Disp: 1 kit, Rfl: 3   Blood Glucose Monitoring Suppl (ACCU-CHEK GUIDE) w/Device KIT, USE AS NEEDED TO CHECK BLOOD SUGARS.,  Disp: 1 kit, Rfl: 0   brimonidine  (ALPHAGAN  P) 0.1 % SOLN, Place 1 drop into both eyes in the morning, at noon, and at bedtime., Disp: , Rfl:    brinzolamide  (AZOPT ) 1 % ophthalmic suspension, Place 1 drop into both eyes 3 (three) times daily., Disp: , Rfl:    carvedilol  (COREG ) 12.5 MG tablet, Take 1 tablet (12.5 mg total) by mouth 2 (two) times daily with a meal., Disp: 180 tablet, Rfl: 3   EPINEPHrine  0.3 mg/0.3 mL IJ SOAJ injection, Inject 0.3 mg into the muscle as needed for anaphylaxis., Disp: 1 each, Rfl: 0   ezetimibe  (ZETIA ) 10 MG tablet, Take 1 tablet (  10 mg total) by mouth daily., Disp: 90 tablet, Rfl: 1   glucose blood (ACCU-CHEK GUIDE TEST) test strip, Use as instructed to check blood sugars E11.9, Disp: 200 each, Rfl: 1   isosorbide  mononitrate (IMDUR ) 60 MG 24 hr tablet, TAKE 1 TABLET(60 MG) BY MOUTH DAILY, Disp: 90 tablet, Rfl: 3   JANUMET  50-500 MG tablet, TAKE 1 TABLET BY MOUTH TWICE DAILY WITH A MEAL, Disp: 90 tablet, Rfl: 1   ketorolac  (ACULAR ) 0.5 % ophthalmic solution, Place 1 drop into the right eye 2 (two) times daily., Disp: , Rfl:    Lancets (ONETOUCH DELICA PLUS LANCET33G) MISC, CHECK BLOOD SUGAR BEFORE BREAKFAST AND DINNER, Disp: 100 each, Rfl: 1   montelukast  (SINGULAIR ) 10 MG tablet, Take 1 tablet (10 mg total) by mouth daily., Disp: 90 tablet, Rfl: 1   nitroGLYCERIN  (NITROSTAT ) 0.4 MG SL tablet, Place 1 tablet (0.4 mg total) under the tongue every 5 (five) minutes as needed for chest pain., Disp: 25 tablet, Rfl: prn   pantoprazole  (PROTONIX ) 40 MG tablet, Take 1 tablet (40 mg total) by mouth daily., Disp: 90 tablet, Rfl: 3   Polyvinyl Alcohol -Povidone PF (REFRESH) 1.4-0.6 % SOLN, Place 1-2 drops into both eyes 3 (three) times daily as needed (dry/irritated eyes.)., Disp: , Rfl:    prednisoLONE  acetate (PRED FORTE ) 1 % ophthalmic suspension, Place 1 drop into both eyes in the morning, at noon, and at bedtime. Place 2 Drops in Left Eye, Place 3 Drops In Right Eye TID,  Disp: , Rfl:    pregabalin  (LYRICA ) 75 MG capsule, TAKE 1 CAPSULE(75 MG) BY MOUTH TWICE DAILY, Disp: 180 capsule, Rfl: 1   Allergies  Allergen Reactions   Crestor [Rosuvastatin Calcium ] Other (See Comments)    muscle aches   Demerol  [Meperidine Hcl]     Other reaction(s): Hallucinations   Shellfish Allergy Itching and Other (See Comments)    Crab, shrimp and lobster ---lips itch and tingle Was told not to eat again after having a allergy test. Lobster, crab and shrimp      Review of Systems  Constitutional:  Positive for fatigue.  Respiratory: Negative.    Cardiovascular: Negative.   Gastrointestinal: Negative.   Neurological: Negative.   Psychiatric/Behavioral: Negative.       Today's Vitals   08/17/23 1208  BP: 118/70  Pulse: 78  Temp: 98.3 F (36.8 C)  SpO2: 98%  Weight: 166 lb (75.3 kg)  Height: 5' 2 (1.575 m)   Body mass index is 30.36 kg/m.  Wt Readings from Last 3 Encounters:  08/17/23 166 lb (75.3 kg)  07/31/23 167 lb (75.8 kg)  07/21/23 160 lb (72.6 kg)     Objective:  Physical Exam Vitals and nursing note reviewed.  Constitutional:      Appearance: Normal appearance.     Comments: Appears fatigued   HENT:     Head: Normocephalic and atraumatic.  Eyes:     Extraocular Movements: Extraocular movements intact.  Cardiovascular:     Rate and Rhythm: Normal rate and regular rhythm.     Heart sounds: Normal heart sounds.  Pulmonary:     Effort: Pulmonary effort is normal.     Breath sounds: Normal breath sounds.  Musculoskeletal:     Cervical back: Normal range of motion.  Skin:    General: Skin is warm.  Neurological:     General: No focal deficit present.     Mental Status: She is alert.  Psychiatric:        Mood  and Affect: Mood normal.        Behavior: Behavior normal.         Assessment And Plan:  Syncope and collapse Assessment & Plan: TCM PERFORMED. A MEMBER OF THE CLINICAL TEAM SPOKE WITH THE PATIENT UPON DISCHARGE. DISCHARGE  SUMMARY WAS REVIEWED IN FULL DETAIL DURING THE VISIT. MEDS RECONCILED AND COMPARED TO DISCHARGE MEDS. MEDICATION LIST WAS UPDATED AND REVIEWED WITH THE PATIENT. GREATER THAN 50% FACE TO FACE TIME WAS SPENT IN COUNSELING AND COORDINATION OF CARE. ALL QUESTIONS WERE ANSWERED TO THE SATISFACTION OF THE PATIENT.  Likely due to dehydration from gastrointestinal symptoms. No acute stroke or cardiac issues. Persistent fatigue and poor appetite noted. - Recheck blood count and kidney function. - Ensure adequate hydration.  Orders: -     BMP8+EGFR -     CBC  Nausea vomiting and diarrhea Assessment & Plan: These sx have since resolved. She is encouraged to consider coconut water or an electrolyte drink like Pedialyte if her sx return.    Acute cystitis without hematuria Assessment & Plan: Treated with antibiotics. Follow-up needed to confirm resolution. - Obtain urine sample to check for resolution of UTI.   Community acquired pneumonia, unspecified laterality Assessment & Plan: - CTA chest showed no PE, scattered opacities with a cavitary lesion in the right upper lobe, not significantly changed from prior exam, tree-in-bud nodules noted in this region which may be infectious or inflammatory. - she was treated with IV Rocephin , doxycycline   - Repeat CT outpatient, will defer to patient's pulmonologist, Dr. Geronimo.    Coronary artery disease involving native coronary artery of native heart with angina pectoris [facility]) Assessment & Plan: Chronic,LDL goal is less than 70. She will continue with atorvastatin  80mg  daily. For some reason, she has stopped ASA 81mg  daily.  - resume ASA 81mg  daily   Sarcoidosis Assessment & Plan: Chronic, followed by Pulmonary. Their input is appreciated.  - Continue with azathioprine .   Dyslipidemia associated with type 2 diabetes mellitus (HCC) Assessment & Plan: Chronic, she is currently on Janumet . Importance of dietary and medication compliance was  discussed with the patient. LDL goal is less than 70, she will continue with atorvastatin  80mg  daily and ezetimibe  10mg  daily. She is encouraged to follow heart healthy lifestyle.   Orders: -     Microalbumin / creatinine urine ratio -     BMP8+EGFR  Urge incontinence Assessment & Plan: Inability to hold urine. Pelvic physical therapy required before urogynecologist referral. - Refer to pelvic physical therapy for bladder training. - Refer to urogynecologist after initiation of pelvic PT.  Orders: -     POCT URINALYSIS DIP (CLINITEK) -     Ambulatory referral to Urogynecology  Imbalance Assessment & Plan: She has been referred to PT by hospitalist, but states she has yet to hear from them. Will refer her if needed. - Importance of adequate protein intake to help with muscle strengthening was discussed with the patient.   Current moderate episode of major depressive disorder, unspecified whether recurrent (HCC) Assessment & Plan: Decreased interest, mood changes, and altered sleep and appetite likely related to recent illness. No medication needed currently. - Monitor mood and consider mental health support if symptoms persist.    Return if symptoms worsen or fail to improve.  Patient was given opportunity to ask questions. Patient verbalized understanding of the plan and was able to repeat key elements of the plan. All questions were answered to their satisfaction.   I, Sheila LOISE Slocumb, MD,  have reviewed all documentation for this visit. The documentation on 08/17/23 for the exam, diagnosis, procedures, and orders are all accurate and complete.   IF YOU HAVE BEEN REFERRED TO A SPECIALIST, IT MAY TAKE 1-2 WEEKS TO SCHEDULE/PROCESS THE REFERRAL. IF YOU HAVE NOT HEARD FROM US /SPECIALIST IN TWO WEEKS, PLEASE GIVE US  A CALL AT [phone].   THE PATIENT IS ENCOURAGED TO PRACTICE SOCIAL DISTANCING DUE TO THE COVID-19 PANDEMIC.

## 2023-08-18 ENCOUNTER — Ambulatory Visit: Admitting: Internal Medicine

## 2023-08-18 ENCOUNTER — Inpatient Hospital Stay: Admitting: Internal Medicine

## 2023-08-18 DIAGNOSIS — K219 Gastro-esophageal reflux disease without esophagitis: Secondary | ICD-10-CM | POA: Diagnosis not present

## 2023-08-18 DIAGNOSIS — Z7984 Long term (current) use of oral hypoglycemic drugs: Secondary | ICD-10-CM | POA: Diagnosis not present

## 2023-08-18 DIAGNOSIS — J189 Pneumonia, unspecified organism: Secondary | ICD-10-CM | POA: Diagnosis not present

## 2023-08-18 DIAGNOSIS — J45909 Unspecified asthma, uncomplicated: Secondary | ICD-10-CM | POA: Diagnosis not present

## 2023-08-18 DIAGNOSIS — I2510 Atherosclerotic heart disease of native coronary artery without angina pectoris: Secondary | ICD-10-CM | POA: Diagnosis not present

## 2023-08-18 DIAGNOSIS — M797 Fibromyalgia: Secondary | ICD-10-CM | POA: Diagnosis not present

## 2023-08-18 DIAGNOSIS — Z9181 History of falling: Secondary | ICD-10-CM | POA: Diagnosis not present

## 2023-08-18 DIAGNOSIS — M6281 Muscle weakness (generalized): Secondary | ICD-10-CM | POA: Diagnosis not present

## 2023-08-18 DIAGNOSIS — G4733 Obstructive sleep apnea (adult) (pediatric): Secondary | ICD-10-CM | POA: Diagnosis not present

## 2023-08-18 DIAGNOSIS — R55 Syncope and collapse: Secondary | ICD-10-CM | POA: Diagnosis not present

## 2023-08-18 DIAGNOSIS — N3000 Acute cystitis without hematuria: Secondary | ICD-10-CM | POA: Diagnosis not present

## 2023-08-18 DIAGNOSIS — Z8673 Personal history of transient ischemic attack (TIA), and cerebral infarction without residual deficits: Secondary | ICD-10-CM | POA: Diagnosis not present

## 2023-08-18 DIAGNOSIS — Z7982 Long term (current) use of aspirin: Secondary | ICD-10-CM | POA: Diagnosis not present

## 2023-08-18 DIAGNOSIS — E119 Type 2 diabetes mellitus without complications: Secondary | ICD-10-CM | POA: Diagnosis not present

## 2023-08-18 DIAGNOSIS — I10 Essential (primary) hypertension: Secondary | ICD-10-CM | POA: Diagnosis not present

## 2023-08-18 DIAGNOSIS — E785 Hyperlipidemia, unspecified: Secondary | ICD-10-CM | POA: Diagnosis not present

## 2023-08-18 LAB — BMP8+EGFR
BUN/Creatinine Ratio: 12 (ref 12–28)
BUN: 11 mg/dL (ref 8–27)
CO2: 24 mmol/L (ref 20–29)
Calcium: 10 mg/dL (ref 8.7–10.3)
Chloride: 100 mmol/L (ref 96–106)
Creatinine, Ser: 0.91 mg/dL (ref 0.57–1.00)
Glucose: 91 mg/dL (ref 70–99)
Potassium: 4.6 mmol/L (ref 3.5–5.2)
Sodium: 141 mmol/L (ref 134–144)
eGFR: 65 mL/min/1.73 (ref >59)

## 2023-08-18 LAB — MICROALBUMIN / CREATININE URINE RATIO
Creatinine, Urine: 159.3 mg/dL
Microalb/Creat Ratio: 5 mg/g{creat} (ref 0–29)
Microalbumin, Urine: 7.2 ug/mL

## 2023-08-18 LAB — CBC
Hematocrit: 37.4 % (ref 34.0–46.6)
Hemoglobin: 11.7 g/dL (ref 11.1–15.9)
MCH: 28 pg (ref 26.6–33.0)
MCHC: 31.3 g/dL — ABNORMAL LOW (ref 31.5–35.7)
MCV: 90 fL (ref 79–97)
Platelets: 243 x10E3/uL (ref 150–450)
RBC: 4.18 x10E6/uL (ref 3.77–5.28)
RDW: 13.8 % (ref 11.7–15.4)
WBC: 10.1 x10E3/uL (ref 3.4–10.8)

## 2023-08-19 ENCOUNTER — Inpatient Hospital Stay: Admitting: Internal Medicine

## 2023-08-20 NOTE — Progress Notes (Deleted)
 [facility] [address] [place]. [place] [address] [phone]       OFFICE FOLLOW UP NOTE  [name] Date of Birth:  1945/09/28 Medical Record Number:  [id number]   Primary neurologist: Dr. Rosemarie Reason for visit: Memory loss, syncopal event, hx of stroke   No chief complaint on file.    HPI:   Update 08/21/2023 JM: Patient returns for follow-up visit.  Underwent neurocognitive testing 06/2023 with overall normal testing and suspected cognitive complaints multifactoral in setting of prior stroke, moderate depression, chronic psychosocial stressors, untreated sleep apnea and potential side effects from medications  Recent ED admission on 6/29 for syncopal episode suspected likely due to dehydration and vasovagal, no evidence of acute stroke on imaging or cardiac issues.  She was treated for acute cystitis and pneumonia.  She has previously been seen by cardiology who also felt episodes related to vasovagal syncope and made adjustments to carvedilol  dosage.       History provided for reference purposes only Update 02/16/2023 JM: Patient returns for follow-up visit accompanied by her husband. Main concern today is in regards to short term memory difficulties. Denies any recent changes or progression but has been persistent over the past couple of years. Able to maintain ADL and IADLs independently she questions return back to driving, hasn't been driving since possible partial seizures, last seizure like activity over 6 months ago. Husband is concerned regarding her return back to driving more so due to her memory, she is wanting to drive just short distances. She is overall sedentary with limited activity and no routine exercise. She enjoys playing Mahjong tile game on her phone, no other cognitive activities/exercises. Intermittent use of CPAP, doesn't sleep well at night, can fall asleep easily during the day. Endorses increase stressors  and underlying  depression/anxiety with feeling functionally limited and being able to do as much as she wants as well as recent death of her mother in law and other family members recently passing. MOCA today 28/30, prior MMSE 27/30 (05/2022). Denies family hx of dementia.   No additional syncopl events or seizure like activity. Denies new stroke/TIA symptoms. Routinely follows with PCP for stroke risk factor management.   Update 12/09/2022 Dr. Rosemarie: She returns for follow-up after last visit 6 months ago.  She is accompanied by her daughter.  Patient states she was admitted to the hospital on 12/02/2022 with suspected syncopal event.  She states that she was home alone she tried to get up and felt nauseous then she must of lost consciousness.  She came to on the floor wedged beneath the sofa in the carpet.  She was unable to get up for 2 hours till the husband came and found her.  She had hit the back of her head and had a bruise.  She felt dizzy and was stumbling and off-balance as well as had a headache.  In the ER she was found to have no evidence of pulmonary embolism or significant fractures.  She is found to have mild UTI.  She was discharged on antibiotics and on 10 with codeine .  She continues to have daily headaches which are mild to moderate.  She takes 1 or 2 tablets of Tylenol  3 which seems to help.  She also feels she has significant stiffness of her shoulder which she had hurt on the right as well as back of the neck.  She denies true vertigo but feels she is off balance.  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  No acute findings.  EEG on 06/09/2022 was normal.  Recent echocardiogram in 11/30/2022 showed ejection fraction of 60 to 65%.  Left atrial size was normal.  Hemoshe is is free globin A1c on 09/22/2022 was 6.3.  I recommended Keppra  for seizure prophylaxis at last  visit with patient took it only for short while and stopped taking it.  Update 05/15/2022 Dr. Rosemarie: She returns for follow-up after last visit 8 months ago.  She is accompanied by her daughter Sheila Reynolds.  Patient continues to have mild cognitive and short-term memory difficulties.  She has good days and bad days.  She may often remember things later.  Is quite independent in activities of daily living.  She can drive and cook.  Last month she had an episode where her granddaughter noticed that she was staring and had a spacey look on her face.  She is not responding when spoken to and following commands.  She remains confused and disoriented for short time.  daughter she has had at least 3 Episodes.  One of them occurred in the setting of urinary tract infection while admitted to outside hospital.  No episodes later on the same day as the last 1.  Review of ER visit note from 05/07/2022 states that the patient had altered mental status possibly presyncopal event.  CT scan of the head showed chronic white matter changes and atrophy changes without acute abnormality.  UA, CBC and BMP labs unremarkable.  Review of [facility] admission note from 11/04/2021 mentions reason for admission as drowsiness and dizziness with urinary tract infection and pneumonitis no mention of seizure-like episode.  CT head during that admission also showed no acute abnormalities.  Patient has no prior known history of seizures or episodes of loss of consciousness or significant head injury.  On cognitive testing today she did quite well and scored 27/30 on MMSE this however slight decline from 30/30 last visit on 09/18/2021.  Update 09/18/2021 JM: Patient scheduled today's visit after prior visit with Dr. Rosemarie 10 months ago for concern of memory loss and brain fog.   Feels like having a harder time registering what is being said to her during conversation. She has little motivation to do activity or do prior things she was  interested in such as going to see a movie or going to a football game. She still manages/rents houses, has had a hard time getting things done such as repairs either due to lack of motivation or will forget she was supposed to do certain tasks.  She does admit to poor sleep, wakes up frequently throughout the night, at times only gets 3-4 hrs.  Admits to intermittent use of CPAP as she has a difficulty tolerating.  Plans on repeating HST to see if apnea still present as initial AHI low.  She does admit to increased stressors at home with family as well as likely underlying depression.  Geriatric depression score 9.  She voices frustration regarding her overall health and multiple health issues.  Does report her father had dementia which did not develop towards end-of-life while in nursing home.  No other family history of dementia.  She is greatly concerned/worried that she has developed dementia.  MMSE today 30/30 (prior 27/30 03/2020) Extensive evaluation back in 03/2020 for complaints of memory loss largely unremarkable PCP completed B12  and TSH which were normal.   Reports headaches have been off and on. Usually has 1 headache per week but not always severe. She believes majority are coming from her poor eyesight.  She is being closely followed by ophthalmology.  No change in headache frequency or characteristics since prior visit. She stopped taking topamax  since prior visit as she did not feel it was helping her headaches. She was seen in ED 1/17 with dizziness and headache with workup largely unremarkable and headache improved after migraine cocktail.  Towards the end of visit, she also mentions continued poor balance which is not a new issue and denies any worsening.  She questions trying physical therapy for possible benefit.  No other concerns at this time  Update 11/15/2020 Dr. Rosemarie She returns for follow-up after last visit with [facility] nurse practitioner on 09/04/2020.  She states headaches  are slightly better but still occur 2-3 times per week particularly at night she often has to wake up.  She has to take 2 tablets of Tylenol  which seems to help and she is able to go back to sleep.  She also has to put prednisone  drops into her eyes which also helped.  She did try Topamax  and did all right for a while she was taking 1 tablet a day and it seemed to help but when she increase it to 2 tablets a day it made her quite sleepy and hence she stopped it after 2 weeks.  She does complain of some posterior neck muscle tightness and pain which she has not been doing any neck stretching exercises.  She continues to have mild gait and balance difficulties but these are longstanding and unchanged.  She has no new complaints.   Update 09/03/2020 JM: Ms. Debose returns for sooner scheduled visit for headaches. She reports being seen by her ophthalmologist who advised her to follow-up with our office for further evaluation.  Extensive work-up completed after prior visit in 04/2020 including MR brain, MR cervical, EEG and lab work which was all unremarkable.  Reports over the past 3 months, she has been experiencing left orbital and periorbital headaches.  Initially experiencing them daily but have since been gradually improving.  She may experience dull headache daily but at times can be severe throbbing headache accompanied by blurred vision but denies actual loss of vision.  She was evaluated by her ophthalmologist with history of open-angle glaucoma bilaterally with recent exam normal IOP bilaterally.  She also has chronic iritis bilaterally with use of prednisone  drops which she reports will improve orbital headache.  She also intermittently use Tylenol  with benefit.  Denies photophobia, phonophobia or N/V.  She was seen by her PCP who did lab work on 6/27 with a normal CRP and sed rate.  Update 04/03/2020 Dr. Rosemarie: She returns for follow-up after last visit year and half ago.  She has new complaints of  worsening balance, dizziness, memory as well as headaches and neck pain.  She states her balance is poor and she has had a few falls but no injuries.  She has to be careful and often can catch her self and does not fall.  She denies any significant neck pain radiating down her spine but does complain of tightness in the neck and trouble moving her neck.  She has had no recent neck x-rays or scans done.  She also complains of posterior headaches as well as bifrontal headaches which are intermittent and last only few minutes.  She takes some  Tylenol  which seems to help.  Cold compression also seems to help her headaches.  She did undergo some carotid ultrasound at last visit after she saw me on 10/29/2018 which showed no significant extracranial stenosis and transcranial Doppler study was also done which showed poor left improvement of but normal velocities in the vessels that could be studied.  On inquiry she admits to numbness in the right hand which is intermittent but more in the night.  She does agree that the numbness seems to more prominent when she performs activities with rapid repetitive wrist flexion.  Initial visit 10/20/2018 Dr. Rosemarie: Ms. Reifsteck is a 78 year old African-American lady seen today for initial office consultation visit for stroke.  She is accompanied by her husband today.  History is obtained from them and review of referral notes and have personally reviewed imaging films in PACS.  The patient had been having steady decline in the vision acuity in the right eye and for which she was seen by neuro-ophthalmology recently.  They ordered an MRI of the brain and orbits which was done on 10/05/2018 which I personally reviewed MRI of the orbits was unremarkable but MRI scan of the brain showed a small left thalamic acute infarct.  Patient had no neurological symptoms at that time and this is likely an incidental finding.  Patient denied any symptoms of right-sided numbness, tingling, weakness gait  or balance problems or memory problems.  She thought a few days prior to her MRI she had some twitchings in the left side of the face but this lasted only a few hours and recovered.  She denied any increased watering of the eyes lack of sensation or weakness.  Patient has no prior history of strokes, TIAs, seizures, migraines or significant neurological problems.  She had lab work on 10/07/2018 which showed LDL cholesterol of 71 mg percent.  She takes Lipitor 40 mg daily.  She had a hemoglobin A1c of 6.1 on 07/20/2018.  Her blood pressure is usually well controlled and today it is 134/77.  Her fasting sugars have all been good in the 110 range.  She also has sleep apnea and is quite compliant with her CPAP and takes it every night.  She has not had any vascular imaging studies done for the brain of the neck.  She has no neurological complaints today.  ROS:   14 system review of systems is positive for those listed in HPI and all other systems negative e and all other systems negative  PMH:  Past Medical History:  Diagnosis Date   Anemia    3 months ago anemic   Anxiety    on meds   Arthritis    all over (01/15/2017)   Asthma    Bronchitis with emphysema    Chest pain    Chronic bronchitis (HCC)    Coronary artery disease    a. 01/2017 she underwent orbital atherectomy/DES to the proxmal LAD and PTCA to ostial D2. 2D Echo 01/15/17 showed mild LVH, EF 60-65%, grade 1 DD.   Family history of anesthesia complication    daughter N/V   Fibromyalgia    GERD (gastroesophageal reflux disease)    on meds   Glaucoma    Heart murmur    History of hiatal hernia    Hx of echocardiogram    Echo (03/2013):  Tech limited; Mild focal basal septal hypertrophy, EF 60-65%, normal RVF   Hyperlipidemia    Hypertension    Nonsustained ventricular tachycardia (HCC)  OSA on CPAP    Premature atrial contractions    PVC (premature ventricular contraction)    a. Holter 12/16: NSR, occ PAC,PVCs   Sarcoidosis     Type II diabetes mellitus (HCC)     Social History:  Social History   Socioeconomic History   Marital status: Married    Spouse name: Medford   Number of children: 2   Years of education: Not on file   Highest education level: Not on file  Occupational History   Occupation: unemployed    Employer: [employer]  Tobacco Use   Smoking status: Former    Current packs/day: 0.00    Average packs/day: 1 pack/day for 4.0 years (4.0 ttl pk-yrs)    Types: Cigarettes    Start date: 02/11/1976    Quit date: 02/11/1980    Years since quitting: 43.5   Smokeless tobacco: Never  Vaping Use   Vaping status: Never Used  Substance and Sexual Activity   Alcohol  use: No   Drug use: No   Sexual activity: Yes  Other Topics Concern   Not on file  Social History Narrative   Lives with husband   Right hand   Drinks 2-3 cups caffeine daily   Social Drivers of Health   Financial Resource Strain: Low Risk  (06/09/2023)   Overall Financial Resource Strain (CARDIA)    Difficulty of Paying Living Expenses: Not hard at all  Food Insecurity: No Food Insecurity (08/12/2023)   Hunger Vital Sign    Worried About Running Out of Food in the Last Year: Never true    Ran Out of Food in the Last Year: Never true  Transportation Needs: No Transportation Needs (08/12/2023)   PRAPARE - Administrator, Civil Service (Medical): No    Lack of Transportation (Non-Medical): No  Physical Activity: Insufficiently Active (06/09/2023)   Exercise Vital Sign    Days of Exercise per Week: 2 days    Minutes of Exercise per Session: 30 min  Stress: No Stress Concern Present (06/09/2023)   [employer] of Occupational Health - Occupational Stress Questionnaire    Feeling of Stress : Not at all  Recent Concern: Stress - Stress Concern Present (05/06/2023)   [employer] of Occupational Health - Occupational Stress Questionnaire    Feeling of Stress : To some extent  Social Connections:  Unknown (08/10/2023)   Social Connection and Isolation Panel    Frequency of Communication with Friends and Family: More than three times a week    Frequency of Social Gatherings with Friends and Family: More than three times a week    Attends Religious Services: Not on file    Active Member of Clubs or Organizations: Yes    Attends Banker Meetings: Patient unable to answer    Marital Status: Not on file  Intimate Partner Violence: Not At Risk (08/12/2023)   Humiliation, Afraid, Rape, and Kick questionnaire    Fear of Current or Ex-Partner: No    Emotionally Abused: No    Physically Abused: No    Sexually Abused: No    Medications:   Current Outpatient Medications on File Prior to Visit  Medication Sig Dispense Refill   acetaminophen  (TYLENOL ) 500 MG tablet Take 1,000 mg by mouth 2 (two) times daily as needed for moderate pain or headache.     albuterol  (PROVENTIL ) (2.5 MG/3ML) 0.083% nebulizer solution Take 3 mLs (2.5 mg total) by nebulization daily as needed for wheezing or shortness of breath.  75 mL 12   albuterol  (VENTOLIN  HFA) 108 (90 Base) MCG/ACT inhaler Inhale 2 puffs into the lungs every 6 (six) hours as needed for wheezing or shortness of breath. 18 g 12   amoxicillin -clavulanate (AUGMENTIN ) 875-125 MG tablet Take 1 tablet by mouth 2 (two) times daily. 8 tablet 0   aspirin  EC 81 MG tablet Take 81 mg by mouth daily. Swallow whole. (Patient not taking: Reported on 08/12/2023)     atorvastatin  (LIPITOR) 80 MG tablet Take 1 tablet (80 mg total) by mouth daily. 90 tablet 3   Azathioprine  75 MG TABS Take 1 tablet by mouth daily.     Blood Glucose Monitoring Suppl (ACCU-CHEK AVIVA PLUS) w/Device KIT Use to check blood sugars 3 times a day. Dx code e11.65 1 kit 3   Blood Glucose Monitoring Suppl (ACCU-CHEK GUIDE) w/Device KIT USE AS NEEDED TO CHECK BLOOD SUGARS. 1 kit 0   brimonidine  (ALPHAGAN  P) 0.1 % SOLN Place 1 drop into both eyes in the morning, at noon, and at bedtime.      brinzolamide  (AZOPT ) 1 % ophthalmic suspension Place 1 drop into both eyes 3 (three) times daily.     carvedilol  (COREG ) 12.5 MG tablet Take 1 tablet (12.5 mg total) by mouth 2 (two) times daily with a meal. 180 tablet 3   EPINEPHrine  0.3 mg/0.3 mL IJ SOAJ injection Inject 0.3 mg into the muscle as needed for anaphylaxis. 1 each 0   ezetimibe  (ZETIA ) 10 MG tablet Take 1 tablet (10 mg total) by mouth daily. 90 tablet 1   glucose blood (ACCU-CHEK GUIDE TEST) test strip Use as instructed to check blood sugars E11.9 200 each 1   isosorbide  mononitrate (IMDUR ) 60 MG 24 hr tablet TAKE 1 TABLET(60 MG) BY MOUTH DAILY 90 tablet 3   JANUMET  50-500 MG tablet TAKE 1 TABLET BY MOUTH TWICE DAILY WITH A MEAL 90 tablet 1   ketorolac  (ACULAR ) 0.5 % ophthalmic solution Place 1 drop into the right eye 2 (two) times daily.     Lancets (ONETOUCH DELICA PLUS LANCET33G) MISC CHECK BLOOD SUGAR BEFORE BREAKFAST AND DINNER 100 each 1   montelukast  (SINGULAIR ) 10 MG tablet Take 1 tablet (10 mg total) by mouth daily. 90 tablet 1   nitroGLYCERIN  (NITROSTAT ) 0.4 MG SL tablet Place 1 tablet (0.4 mg total) under the tongue every 5 (five) minutes as needed for chest pain. 25 tablet prn   pantoprazole  (PROTONIX ) 40 MG tablet Take 1 tablet (40 mg total) by mouth daily. 90 tablet 3   Polyvinyl Alcohol -Povidone PF (REFRESH) 1.4-0.6 % SOLN Place 1-2 drops into both eyes 3 (three) times daily as needed (dry/irritated eyes.).     prednisoLONE  acetate (PRED FORTE ) 1 % ophthalmic suspension Place 1 drop into both eyes in the morning, at noon, and at bedtime. Place 2 Drops in Left Eye, Place 3 Drops In Right Eye TID     pregabalin  (LYRICA ) 75 MG capsule TAKE 1 CAPSULE(75 MG) BY MOUTH TWICE DAILY 180 capsule 1   No current facility-administered medications on file prior to visit.    Allergies:   Allergies  Allergen Reactions   Crestor [Rosuvastatin Calcium ] Other (See Comments)    muscle aches   Demerol  [Meperidine Hcl]     Other  reaction(s): Hallucinations   Shellfish Allergy Itching and Other (See Comments)    Crab, shrimp and lobster ---lips itch and tingle Was told not to eat again after having a allergy test. Lobster, crab and shrimp  Physical Exam There were no vitals filed for this visit.  There is no height or weight on file to calculate BMI.   General: well developed, well nourished pleasant elderly African-American lady, seated, in no evident distress Head: head normocephalic and atraumatic.   Neck: supple with no carotid or supraclavicular bruits Cardiovascular: regular rate and rhythm, no murmurs Musculoskeletal: no deformity.   Skin:  no rash/petichiae Vascular:  Normal pulses all extremities  Neurologic Exam Mental Status: Awake and fully alert.  Fluent speech and language.  Oriented to place and time. Recent memory subjectively impaired and remote memory intact. Attention span, concentration and fund of knowledge appropriate during visit.  Provided extensive history and lots of information during visit without difficulty.  Mood and affect flat Cranial Nerves: Pupils equal, briskly reactive to light. Extraocular movements full without nystagmus. Visual fields full to confrontation.  Diminished vision acuity bilaterally (chronic).  Hearing slightly diminished bilaterally. Facial sensation intact. Face, tongue, palate moves normally and symmetrically.  Motor: Normal bulk and tone. Normal strength in all tested extremity muscles. Sensory.: intact to touch , pinprick , position and vibratory sensation.   Coordination: Rapid alternating movements normal in all extremities. Finger-to-nose and heel-to-shin performed accurately bilaterally. Gait and Station: Arises from chair without difficulty. Stance is normal. Gait demonstrates normal stride length and mild imbalance with use of cane.  Reflexes: 1+ and symmetric. Toes downgoing.      02/16/2023   12:07 PM  Montreal Cognitive Assessment    Visuospatial/ Executive (0/5) 4  Naming (0/3) 3  Attention: Read list of digits (0/2) 2  Attention: Read list of letters (0/1) 1  Attention: Serial 7 subtraction starting at 100 (0/3) 3  Language: Repeat phrase (0/2) 2  Language : Fluency (0/1) 1  Abstraction (0/2) 2  Delayed Recall (0/5) 4  Orientation (0/6) 6  Total 28      05/15/2022    1:20 PM 09/18/2021    7:46 AM 04/03/2020    1:22 PM  MMSE - Mini Mental State Exam  Orientation to time 4 5 5   Orientation to Place 5 5 5   Registration 2 3 3   Attention/ Calculation 5 5 3   Recall 3 3 2   Language- name 2 objects 2 2 2   Language- repeat 1 1 1   Language- follow 3 step command 3 3 3   Language- read & follow direction 1 1 1   Write a sentence 1 1 1   Copy design 0 1 1  Total score 27 30 27        IMAGING/LABS  MR brain w wo 04/2020 -chronic left thalamic lacunar infarct -negative for acute findings MR cervical 04/2020 - stable, mild disc bulging at C3-4 down to C6-7 EEG 04/2020  unremarkable Lab work 04/2020 WNL (sed rate, dementia panel, lipid panel and A1c)        ASSESSMENT/PLAN: 78 year old African-American lady with asymptomatic left subcortical lacunar infarct in August 2020 discovered at the time of brain imaging for vision difficulties which is unrelated and likely from glaucoma and eye problem.  Vascular risk factors of hypertension, hyperlipidemia, diabetes and sleep apnea with CPAP noncompliance.  At 03/2020 visit, new complaints of worsening balance, right hand numbness, memory loss, headaches -extensive evaluation unremarkable but continued to experience frontal and left orbital headaches.   Episodes of transient altered mental status with staring and unresponsiveness possible complex partial seizures and started on Keppra  but patient self d/c'd after a couple months without any recurrent events.  She also has mild memory and  cognitive difficulties likely multifactorial per neuropsychology.  Prior complaints in  11/2022 of daily headache and dizziness following syncopal event with postconcussive symptoms which gradually improved.  She continues to have syncopal events likely due to vasovagal syncope.    -main concern today is regarding short term memory difficulties, will place referral to neuropsych for neurocognitive testing especially as prior MMSE's and recent MOCA WNL. Suspect multifactoral cause of memory complaints including prior stroke, noncompliance with CPAP, suboptimal control of depression/anxiety, polypharmacy and being sedentary. Discussed all of this at length today, advised to use CPAP nightly, increase physical and cognitive exercises and f/u with PCP if depression/anxiety continues to be a concern. Can consider use of Aricept or Namenda in the future as needed but would prefer to hold off at this time.  -in regards to return to driving, would recommend waiting until neurocognitive evaluation or undergo driving evaluation as husband has concerns regarding her returning back to driving with her memory. Discussed reaction time and multitasking can be impaired with memory difficulties. She verbalized understanding.  -No additional seizure-like activity, EEG 01/2023 no evidence of seizures, can hold off on restarted ASM unless she has more seizure-like events - she was advised to call if this should occur  -continue to follow with PCP for stroke risk factor management      Follow up in 6 months for repeat MOCA or call earlier     I personally spent a total of *** minutes in the care of the patient today including {Time Based Coding:[id number]}.   Harlene Bogaert, AGNP-BC  [facility] [address] [place], [place] [address]  Phone [phone] Fax [phone] Note: This document was prepared with digital dictation and possible smart phrase technology. Any transcriptional errors that result from this process are unintentional.

## 2023-08-21 ENCOUNTER — Ambulatory Visit: Admitting: Adult Health

## 2023-08-23 ENCOUNTER — Encounter: Payer: Self-pay | Admitting: Internal Medicine

## 2023-08-23 NOTE — Assessment & Plan Note (Signed)
 Chronic,LDL goal is less than 70. She will continue with atorvastatin  80mg  daily. For some reason, she has stopped ASA 81mg  daily.  - resume ASA 81mg  daily

## 2023-08-23 NOTE — Assessment & Plan Note (Signed)
 Chronic, followed by Pulmonary. Their input is appreciated.  - Continue with azathioprine .

## 2023-08-23 NOTE — Progress Notes (Incomplete)
 I,Victoria T Emmitt, CMA,acting as a Neurosurgeon for [name], MD.,have documented all relevant documentation on the behalf of [name], MD,as directed by  [name], MD while in the presence of [name], MD.  Subjective:  Patient ID: Sheila Reynolds , female    DOB: [birth year] , 78 y.o.   MRN: [id number]  Chief Complaint  Patient presents with  . Hospitalization Follow-up    Patient presents today for hospital follow up. She went to [facility] on 6/29, admitted on same day. Discharged on 7/1. Today she reports feeling okay.  She wants to know what could be the main cause for these episodes.  She would like a referral to a Nephrologist that also specializes in GYN care.  She also still experiences chronic cough. She admits has not gotten much better.  She will call to reschedule mammogram.      HPI Discussed the use of AI scribe software for clinical note transcription with the patient, who gave verbal consent to proceed.  History of Present Illness The patient, with a history of coronary artery disease and sarcoidosis, presents for a follow-up after hospitalization for syncope, nausea, vomiting, and diarrhea.  She is accompanied by her husband today.  She was admitted to the hospital on June 29 and discharged on July 1 after presenting with nausea, vomiting, and diarrhea that had persisted for several days. These symptoms began after attending her sister's birthday party, where she felt fatigued, laid down, and later woke up with nausea and vomiting. She was found on the floor by her husband, around 7:30 AM, prompting a call to EMS. He was not sure if she passed out or if she just laid down because she felt so weak.  ER workup including CT brain.  A CT of the brain showed plaque in the carotid arteries, and an echocardiogram was performed. She was treated with IV fluids and diagnosed with a UTI, receiving IV Rocephin  and discharged on Augmentin , which she has  completed.  Since her discharge, she has not experienced further nausea, vomiting, or diarrhea but continues to feel fatigued and reports a poor appetite. She states, 'Instead of eating a plate of food, I eat maybe a fourth of a plate. I can't, I just have no appetite.' Her appetite had decreased slightly before hospitalization and worsened afterward.  She has a history of coronary artery disease and is on aspirin  and Lipitor. She is also taking azathioprine  for sarcoidosis. She had a bacterial sinus infection treated with Abx and prednisone  before her sister's birthday party. In the hospital, she received a third round of antibiotics.  She reports feeling fatigued since her hospitalization, with decreased interest in activities, feeling down, depressed, or hopeless, and an altered sleep schedule. She attributes these mood changes to her recent illness and the need to rely on others for transportation, being home alone from Wednesday through Saturday night.  She also reports bladder issues, stating, 'When I get the urge to go, I don't make it.' She has not started pelvic physical therapy for this issue yet.  Her blood sugar was 6.2 in the hospital, and she was given insulin  there. She does not regularly drink Pepsi, despite having had some on the day of her sister's birthday party.  Past Medical History:  Diagnosis Date  . Anemia    3 months ago anemic  . Anxiety    on meds  . Arthritis    all over (01/15/2017)  .  Asthma   . Bronchitis with emphysema   . Chest pain   . Chronic bronchitis (HCC)   . Coronary artery disease    a. 01/2017 she underwent orbital atherectomy/DES to the proxmal LAD and PTCA to ostial D2. 2D Echo 01/15/17 showed mild LVH, EF 60-65%, grade 1 DD.  . Family history of anesthesia complication    daughter N/V  . Fibromyalgia   . GERD (gastroesophageal reflux disease)    on meds  . Glaucoma   . Heart murmur   . History of hiatal hernia   . Hx of echocardiogram     Echo (03/2013):  Tech limited; Mild focal basal septal hypertrophy, EF 60-65%, normal RVF  . Hyperlipidemia   . Hypertension   . Nonsustained ventricular tachycardia (HCC)   . OSA on CPAP   . Premature atrial contractions   . PVC (premature ventricular contraction)    a. Holter 12/16: NSR, occ PAC,PVCs  . Sarcoidosis   . Type II diabetes mellitus (HCC)      Family History  Problem Relation Age of Onset  . Heart disease Father   . Diabetes Father   . Glaucoma Father   . Cancer Mother        unknown type, ?lung  . Heart disease Paternal Grandmother   . Cancer Paternal Grandmother        unknown type  . Diabetes Brother   . Glaucoma Brother      Current Outpatient Medications:  .  acetaminophen  (TYLENOL ) 500 MG tablet, Take 1,000 mg by mouth 2 (two) times daily as needed for moderate pain or headache., Disp: , Rfl:  .  albuterol  (PROVENTIL ) (2.5 MG/3ML) 0.083% nebulizer solution, Take 3 mLs (2.5 mg total) by nebulization daily as needed for wheezing or shortness of breath., Disp: 75 mL, Rfl: 12 .  albuterol  (VENTOLIN  HFA) 108 (90 Base) MCG/ACT inhaler, Inhale 2 puffs into the lungs every 6 (six) hours as needed for wheezing or shortness of breath., Disp: 18 g, Rfl: 12 .  amoxicillin -clavulanate (AUGMENTIN ) 875-125 MG tablet, Take 1 tablet by mouth 2 (two) times daily., Disp: 8 tablet, Rfl: 0 .  aspirin  EC 81 MG tablet, Take 81 mg by mouth daily. Swallow whole. (Patient not taking: Reported on 08/12/2023), Disp: , Rfl:  .  atorvastatin  (LIPITOR) 80 MG tablet, Take 1 tablet (80 mg total) by mouth daily., Disp: 90 tablet, Rfl: 3 .  Azathioprine  75 MG TABS, Take 1 tablet by mouth daily., Disp: , Rfl:  .  Blood Glucose Monitoring Suppl (ACCU-CHEK AVIVA PLUS) w/Device KIT, Use to check blood sugars 3 times a day. Dx code e11.65, Disp: 1 kit, Rfl: 3 .  Blood Glucose Monitoring Suppl (ACCU-CHEK GUIDE) w/Device KIT, USE AS NEEDED TO CHECK BLOOD SUGARS., Disp: 1 kit, Rfl: 0 .  brimonidine   (ALPHAGAN  P) 0.1 % SOLN, Place 1 drop into both eyes in the morning, at noon, and at bedtime., Disp: , Rfl:  .  brinzolamide  (AZOPT ) 1 % ophthalmic suspension, Place 1 drop into both eyes 3 (three) times daily., Disp: , Rfl:  .  carvedilol  (COREG ) 12.5 MG tablet, Take 1 tablet (12.5 mg total) by mouth 2 (two) times daily with a meal., Disp: 180 tablet, Rfl: 3 .  EPINEPHrine  0.3 mg/0.3 mL IJ SOAJ injection, Inject 0.3 mg into the muscle as needed for anaphylaxis., Disp: 1 each, Rfl: 0 .  ezetimibe  (ZETIA ) 10 MG tablet, Take 1 tablet (10 mg total) by mouth daily., Disp: 90 tablet, Rfl: 1 .  glucose blood (ACCU-CHEK GUIDE TEST) test strip, Use as instructed to check blood sugars E11.9, Disp: 200 each, Rfl: 1 .  isosorbide  mononitrate (IMDUR ) 60 MG 24 hr tablet, TAKE 1 TABLET(60 MG) BY MOUTH DAILY, Disp: 90 tablet, Rfl: 3 .  JANUMET  50-500 MG tablet, TAKE 1 TABLET BY MOUTH TWICE DAILY WITH A MEAL, Disp: 90 tablet, Rfl: 1 .  ketorolac  (ACULAR ) 0.5 % ophthalmic solution, Place 1 drop into the right eye 2 (two) times daily., Disp: , Rfl:  .  Lancets (ONETOUCH DELICA PLUS LANCET33G) MISC, CHECK BLOOD SUGAR BEFORE BREAKFAST AND DINNER, Disp: 100 each, Rfl: 1 .  montelukast  (SINGULAIR ) 10 MG tablet, Take 1 tablet (10 mg total) by mouth daily., Disp: 90 tablet, Rfl: 1 .  nitroGLYCERIN  (NITROSTAT ) 0.4 MG SL tablet, Place 1 tablet (0.4 mg total) under the tongue every 5 (five) minutes as needed for chest pain., Disp: 25 tablet, Rfl: prn .  pantoprazole  (PROTONIX ) 40 MG tablet, Take 1 tablet (40 mg total) by mouth daily., Disp: 90 tablet, Rfl: 3 .  Polyvinyl Alcohol -Povidone PF (REFRESH) 1.4-0.6 % SOLN, Place 1-2 drops into both eyes 3 (three) times daily as needed (dry/irritated eyes.)., Disp: , Rfl:  .  prednisoLONE  acetate (PRED FORTE ) 1 % ophthalmic suspension, Place 1 drop into both eyes in the morning, at noon, and at bedtime. Place 2 Drops in Left Eye, Place 3 Drops In Right Eye TID, Disp: , Rfl:  .   pregabalin  (LYRICA ) 75 MG capsule, TAKE 1 CAPSULE(75 MG) BY MOUTH TWICE DAILY, Disp: 180 capsule, Rfl: 1   Allergies  Allergen Reactions  . Crestor [Rosuvastatin Calcium ] Other (See Comments)    muscle aches  . Demerol  [Meperidine Hcl]     Other reaction(s): Hallucinations  . Shellfish Allergy Itching and Other (See Comments)    Crab, shrimp and lobster ---lips itch and tingle Was told not to eat again after having a allergy test. Lobster, crab and shrimp      Review of Systems  Constitutional:  Positive for fatigue.  Respiratory: Negative.    Cardiovascular: Negative.   Gastrointestinal: Negative.   Neurological: Negative.   Psychiatric/Behavioral: Negative.       Today's Vitals   08/17/23 1208  BP: 118/70  Pulse: 78  Temp: 98.3 F (36.8 C)  SpO2: 98%  Weight: 166 lb (75.3 kg)  Height: 5' 2 (1.575 m)   Body mass index is 30.36 kg/m.  Wt Readings from Last 3 Encounters:  08/17/23 166 lb (75.3 kg)  07/31/23 167 lb (75.8 kg)  07/21/23 160 lb (72.6 kg)     Objective:  Physical Exam Vitals and nursing note reviewed.  Constitutional:      Appearance: Normal appearance.  HENT:     Head: Normocephalic and atraumatic.  Eyes:     Extraocular Movements: Extraocular movements intact.  Cardiovascular:     Rate and Rhythm: Normal rate and regular rhythm.     Heart sounds: Normal heart sounds.  Pulmonary:     Effort: Pulmonary effort is normal.     Breath sounds: Normal breath sounds.  Musculoskeletal:     Cervical back: Normal range of motion.  Skin:    General: Skin is warm.  Neurological:     General: No focal deficit present.     Mental Status: She is alert.  Psychiatric:        Mood and Affect: Mood normal.        Behavior: Behavior normal.  Assessment And Plan:  Syncope and collapse Assessment & Plan: TCM PERFORMED. A MEMBER OF THE CLINICAL TEAM SPOKE WITH THE PATIENT UPON DISCHARGE. DISCHARGE SUMMARY WAS REVIEWED IN FULL DETAIL DURING THE  VISIT. MEDS RECONCILED AND COMPARED TO DISCHARGE MEDS. MEDICATION LIST WAS UPDATED AND REVIEWED WITH THE PATIENT. GREATER THAN 50% FACE TO FACE TIME WAS SPENT IN COUNSELING AND COORDINATION OF CARE. ALL QUESTIONS WERE ANSWERED TO THE SATISFACTION OF THE PATIENT.  Likely due to dehydration from gastrointestinal symptoms. No acute stroke or cardiac issues. Persistent fatigue and poor appetite noted. - Recheck blood count and kidney function. - Ensure adequate hydration.  Orders: -     BMP8+EGFR -     CBC  Nausea vomiting and diarrhea  Acute cystitis without hematuria Assessment & Plan: Treated with antibiotics. Follow-up needed to confirm resolution. - Obtain urine sample to check for resolution of UTI.   Community acquired pneumonia, unspecified laterality  Coronary artery disease involving native coronary artery of native heart with angina pectoris [facility]) Assessment & Plan: Chronic,LDL goal is less than 70. She will continue with atorvastatin  80mg  daily. For some reason, she has stopped ASA 81mg  daily.  - resume ASA 81mg  daily   Sarcoidosis Assessment & Plan: Chronic, followed by Pulmonary. Their input is appreciated.  - Continue with azathioprine .   Dyslipidemia associated with type 2 diabetes mellitus (HCC) Assessment & Plan: Chronic, she is currently on Janumet . Importance of dietary and medication compliance was discussed with the patient. LDL goal is less than 70, she will continue with atorvastatin  80mg  daily and ezetimibe  10mg  daily. She is encouraged to follow heart healthy lifestyle.   Orders: -     Microalbumin / creatinine urine ratio -     BMP8+EGFR  Urge incontinence Assessment & Plan: Inability to hold urine. Pelvic physical therapy required before urogynecologist referral. - Refer to pelvic physical therapy for bladder training. - Refer to urogynecologist after initiation of pelvic PT.  Orders: -     POCT URINALYSIS DIP (CLINITEK) -     Ambulatory referral  to Urogynecology  Imbalance Assessment & Plan: She has been referred to PT by hospitalist, but states she has yet to hear from them. Will refer her if needed. - Importance of adequate protein intake to help with muscle strengthening was discussed with the patient.   Current moderate episode of major depressive disorder, unspecified whether recurrent (HCC) Assessment & Plan: Decreased interest, mood changes, and altered sleep and appetite likely related to recent illness. No medication needed currently. - Monitor mood and consider mental health support if symptoms persist.    Return if symptoms worsen or fail to improve.  Patient was given opportunity to ask questions. Patient verbalized understanding of the plan and was able to repeat key elements of the plan. All questions were answered to their satisfaction.   I, [name], MD, have reviewed all documentation for this visit. The documentation on 08/17/23 for the exam, diagnosis, procedures, and orders are all accurate and complete.   IF YOU HAVE BEEN REFERRED TO A SPECIALIST, IT MAY TAKE 1-2 WEEKS TO SCHEDULE/PROCESS THE REFERRAL. IF YOU HAVE NOT HEARD FROM US /SPECIALIST IN TWO WEEKS, PLEASE GIVE US  A CALL AT [phone] X [phone].   THE PATIENT IS ENCOURAGED TO PRACTICE SOCIAL DISTANCING DUE TO THE COVID-19 PANDEMIC.

## 2023-08-23 NOTE — Assessment & Plan Note (Signed)
 Treated with antibiotics. Follow-up needed to confirm resolution. - Obtain urine sample to check for resolution of UTI.

## 2023-08-23 NOTE — Assessment & Plan Note (Signed)
 Inability to hold urine. Pelvic physical therapy required before urogynecologist referral. - Refer to pelvic physical therapy for bladder training. - Refer to urogynecologist after initiation of pelvic PT.

## 2023-08-23 NOTE — Assessment & Plan Note (Signed)
 TCM PERFORMED. A MEMBER OF THE CLINICAL TEAM SPOKE WITH THE PATIENT UPON DISCHARGE. DISCHARGE SUMMARY WAS REVIEWED IN FULL DETAIL DURING THE VISIT. MEDS RECONCILED AND COMPARED TO DISCHARGE MEDS. MEDICATION LIST WAS UPDATED AND REVIEWED WITH THE PATIENT. GREATER THAN 50% FACE TO FACE TIME WAS SPENT IN COUNSELING AND COORDINATION OF CARE. ALL QUESTIONS WERE ANSWERED TO THE SATISFACTION OF THE PATIENT.  Likely due to dehydration from gastrointestinal symptoms. No acute stroke or cardiac issues. Persistent fatigue and poor appetite noted. - Recheck blood count and kidney function. - Ensure adequate hydration.

## 2023-08-24 ENCOUNTER — Encounter (HOSPITAL_BASED_OUTPATIENT_CLINIC_OR_DEPARTMENT_OTHER): Payer: Self-pay | Admitting: Family

## 2023-08-24 ENCOUNTER — Other Ambulatory Visit (HOSPITAL_BASED_OUTPATIENT_CLINIC_OR_DEPARTMENT_OTHER): Payer: Self-pay

## 2023-08-24 ENCOUNTER — Ambulatory Visit (HOSPITAL_BASED_OUTPATIENT_CLINIC_OR_DEPARTMENT_OTHER): Admitting: Family

## 2023-08-24 ENCOUNTER — Encounter: Payer: Self-pay | Admitting: Internal Medicine

## 2023-08-24 VITALS — BP 138/82 | HR 78 | Ht 62.0 in | Wt 166.0 lb

## 2023-08-24 DIAGNOSIS — E785 Hyperlipidemia, unspecified: Secondary | ICD-10-CM | POA: Diagnosis not present

## 2023-08-24 DIAGNOSIS — Z8701 Personal history of pneumonia (recurrent): Secondary | ICD-10-CM

## 2023-08-24 DIAGNOSIS — R052 Subacute cough: Secondary | ICD-10-CM

## 2023-08-24 DIAGNOSIS — R112 Nausea with vomiting, unspecified: Secondary | ICD-10-CM | POA: Insufficient documentation

## 2023-08-24 DIAGNOSIS — I25118 Atherosclerotic heart disease of native coronary artery with other forms of angina pectoris: Secondary | ICD-10-CM

## 2023-08-24 DIAGNOSIS — J189 Pneumonia, unspecified organism: Secondary | ICD-10-CM | POA: Insufficient documentation

## 2023-08-24 MED ORDER — GUAIFENESIN ER 600 MG PO TB12
600.0000 mg | ORAL_TABLET | Freq: Two times a day (BID) | ORAL | 1 refills | Status: DC
  Filled 2023-08-24: qty 60, 30d supply, fill #0

## 2023-08-24 MED ORDER — BENZONATATE 100 MG PO CAPS
ORAL_CAPSULE | ORAL | 1 refills | Status: DC
  Filled 2023-08-24: qty 30, 10d supply, fill #0

## 2023-08-24 NOTE — Assessment & Plan Note (Signed)
-   CTA chest showed no PE, scattered opacities with a cavitary lesion in the right upper lobe, not significantly changed from prior exam, tree-in-bud nodules noted in this region which may be infectious or inflammatory. - she was treated with IV Rocephin , doxycycline   - Repeat CT outpatient, will defer to patient's pulmonologist, Dr. Geronimo.

## 2023-08-24 NOTE — Progress Notes (Unsigned)
 [facility] [address] [place]. [place] [address] [phone]       OFFICE FOLLOW UP NOTE  [name] Date of Birth:  05/27/1945 Medical Record Number:  [id number]   Primary neurologist: Dr. Rosemarie Reason for visit: Memory loss, syncopal event, hx of stroke   No chief complaint on file.    HPI:   Update 08/25/2023 JM: Patient returns for follow-up visit.  Underwent neurocognitive testing 06/2023 with overall normal testing and suspected cognitive complaints multifactoral in setting of prior stroke, moderate depression, chronic psychosocial stressors, untreated sleep apnea and potential side effects from medications  Recent ED admission on 6/29 for syncopal episode suspected likely due to dehydration and vasovagal, no evidence of acute stroke on imaging or cardiac issues.  She was treated for acute cystitis and pneumonia.  She has previously been seen by cardiology who also felt episodes related to vasovagal syncope and made adjustments to carvedilol  dosage in 04/2023 and was referred to PT.       History provided for reference purposes only Update 02/16/2023 JM: Patient returns for follow-up visit accompanied by her husband. Main concern today is in regards to short term memory difficulties. Denies any recent changes or progression but has been persistent over the past couple of years. Able to maintain ADL and IADLs independently she questions return back to driving, hasn't been driving since possible partial seizures, last seizure like activity over 6 months ago. Husband is concerned regarding her return back to driving more so due to her memory, she is wanting to drive just short distances. She is overall sedentary with limited activity and no routine exercise. She enjoys playing Mahjong tile game on her phone, no other cognitive activities/exercises. Intermittent use of CPAP, doesn't sleep well at night, can fall asleep easily during the day. Endorses  increase stressors  and underlying depression/anxiety with feeling functionally limited and being able to do as much as she wants as well as recent death of her mother in law and other family members recently passing. MOCA today 28/30, prior MMSE 27/30 (05/2022). Denies family hx of dementia.   No additional syncopl events or seizure like activity. Denies new stroke/TIA symptoms. Routinely follows with PCP for stroke risk factor management.   Update 12/09/2022 Dr. Rosemarie: She returns for follow-up after last visit 6 months ago.  She is accompanied by her daughter.  Patient states she was admitted to the hospital on 12/02/2022 with suspected syncopal event.  She states that she was home alone she tried to get up and felt nauseous then she must of lost consciousness.  She came to on the floor wedged beneath the sofa in the carpet.  She was unable to get up for 2 hours till the husband came and found her.  She had hit the back of her head and had a bruise.  She felt dizzy and was stumbling and off-balance as well as had a headache.  In the ER she was found to have no evidence of pulmonary embolism or significant fractures.  She is found to have mild UTI.  She was discharged on antibiotics and on 10 with codeine .  She continues to have daily headaches which are mild to moderate.  She takes 1 or 2 tablets of Tylenol  3 which seems to help.  She also feels she has significant stiffness of her shoulder which she had hurt on the right as well as back of the neck.  She denies true vertigo but feels she is  off balance.  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  No acute findings.  EEG on 06/09/2022 was normal.  Recent echocardiogram in 11/30/2022 showed ejection fraction of 60 to 65%.  Left atrial size was normal.  Hemoshe is is free globin A1c on 09/22/2022 was 6.3.  I recommended  Keppra  for seizure prophylaxis at last visit with patient took it only for short while and stopped taking it.  Update 05/15/2022 Dr. Rosemarie: She returns for follow-up after last visit 8 months ago.  She is accompanied by her daughter Sheila Reynolds.  Patient continues to have mild cognitive and short-term memory difficulties.  She has good days and bad days.  She may often remember things later.  Is quite independent in activities of daily living.  She can drive and cook.  Last month she had an episode where her granddaughter noticed that she was staring and had a spacey look on her face.  She is not responding when spoken to and following commands.  She remains confused and disoriented for short time.  daughter she has had at least 3 Episodes.  One of them occurred in the setting of urinary tract infection while admitted to outside hospital.  No episodes later on the same day as the last 1.  Review of ER visit note from 05/07/2022 states that the patient had altered mental status possibly presyncopal event.  CT scan of the head showed chronic white matter changes and atrophy changes without acute abnormality.  UA, CBC and BMP labs unremarkable.  Review of [facility] admission note from 11/04/2021 mentions reason for admission as drowsiness and dizziness with urinary tract infection and pneumonitis no mention of seizure-like episode.  CT head during that admission also showed no acute abnormalities.  Patient has no prior known history of seizures or episodes of loss of consciousness or significant head injury.  On cognitive testing today she did quite well and scored 27/30 on MMSE this however slight decline from 30/30 last visit on 09/18/2021.  Update 09/18/2021 JM: Patient scheduled today's visit after prior visit with Dr. Rosemarie 10 months ago for concern of memory loss and brain fog.   Feels like having a harder time registering what is being said to her during conversation. She has little motivation to do  activity or do prior things she was interested in such as going to see a movie or going to a football game. She still manages/rents houses, has had a hard time getting things done such as repairs either due to lack of motivation or will forget she was supposed to do certain tasks.  She does admit to poor sleep, wakes up frequently throughout the night, at times only gets 3-4 hrs.  Admits to intermittent use of CPAP as she has a difficulty tolerating.  Plans on repeating HST to see if apnea still present as initial AHI low.  She does admit to increased stressors at home with family as well as likely underlying depression.  Geriatric depression score 9.  She voices frustration regarding her overall health and multiple health issues.  Does report her father had dementia which did not develop towards end-of-life while in nursing home.  No other family history of dementia.  She is greatly concerned/worried that she has developed dementia.  MMSE today 30/30 (prior 27/30 03/2020) Extensive evaluation back in 03/2020 for complaints of  memory loss largely unremarkable PCP completed B12 and TSH which were normal.   Reports headaches have been off and on. Usually has 1 headache per week but not always severe. She believes majority are coming from her poor eyesight.  She is being closely followed by ophthalmology.  No change in headache frequency or characteristics since prior visit. She stopped taking topamax  since prior visit as she did not feel it was helping her headaches. She was seen in ED 1/17 with dizziness and headache with workup largely unremarkable and headache improved after migraine cocktail.  Towards the end of visit, she also mentions continued poor balance which is not a new issue and denies any worsening.  She questions trying physical therapy for possible benefit.  No other concerns at this time  Update 11/15/2020 Dr. Rosemarie She returns for follow-up after last visit with [facility] nurse practitioner on  09/04/2020.  She states headaches are slightly better but still occur 2-3 times per week particularly at night she often has to wake up.  She has to take 2 tablets of Tylenol  which seems to help and she is able to go back to sleep.  She also has to put prednisone  drops into her eyes which also helped.  She did try Topamax  and did all right for a while she was taking 1 tablet a day and it seemed to help but when she increase it to 2 tablets a day it made her quite sleepy and hence she stopped it after 2 weeks.  She does complain of some posterior neck muscle tightness and pain which she has not been doing any neck stretching exercises.  She continues to have mild gait and balance difficulties but these are longstanding and unchanged.  She has no new complaints.   Update 09/03/2020 JM: Ms. Ditto returns for sooner scheduled visit for headaches. She reports being seen by her ophthalmologist who advised her to follow-up with our office for further evaluation.  Extensive work-up completed after prior visit in 04/2020 including MR brain, MR cervical, EEG and lab work which was all unremarkable.  Reports over the past 3 months, she has been experiencing left orbital and periorbital headaches.  Initially experiencing them daily but have since been gradually improving.  She may experience dull headache daily but at times can be severe throbbing headache accompanied by blurred vision but denies actual loss of vision.  She was evaluated by her ophthalmologist with history of open-angle glaucoma bilaterally with recent exam normal IOP bilaterally.  She also has chronic iritis bilaterally with use of prednisone  drops which she reports will improve orbital headache.  She also intermittently use Tylenol  with benefit.  Denies photophobia, phonophobia or N/V.  She was seen by her PCP who did lab work on 6/27 with a normal CRP and sed rate.  Update 04/03/2020 Dr. Rosemarie: She returns for follow-up after last visit year and half  ago.  She has new complaints of worsening balance, dizziness, memory as well as headaches and neck pain.  She states her balance is poor and she has had a few falls but no injuries.  She has to be careful and often can catch her self and does not fall.  She denies any significant neck pain radiating down her spine but does complain of tightness in the neck and trouble moving her neck.  She has had no recent neck x-rays or scans done.  She also complains of posterior headaches as well as bifrontal headaches which are intermittent and last  only few minutes.  She takes some Tylenol  which seems to help.  Cold compression also seems to help her headaches.  She did undergo some carotid ultrasound at last visit after she saw me on 10/29/2018 which showed no significant extracranial stenosis and transcranial Doppler study was also done which showed poor left improvement of but normal velocities in the vessels that could be studied.  On inquiry she admits to numbness in the right hand which is intermittent but more in the night.  She does agree that the numbness seems to more prominent when she performs activities with rapid repetitive wrist flexion.  Initial visit 10/20/2018 Dr. Rosemarie: Ms. Fennimore is a 78 year old African-American lady seen today for initial office consultation visit for stroke.  She is accompanied by her husband today.  History is obtained from them and review of referral notes and have personally reviewed imaging films in PACS.  The patient had been having steady decline in the vision acuity in the right eye and for which she was seen by neuro-ophthalmology recently.  They ordered an MRI of the brain and orbits which was done on 10/05/2018 which I personally reviewed MRI of the orbits was unremarkable but MRI scan of the brain showed a small left thalamic acute infarct.  Patient had no neurological symptoms at that time and this is likely an incidental finding.  Patient denied any symptoms of right-sided  numbness, tingling, weakness gait or balance problems or memory problems.  She thought a few days prior to her MRI she had some twitchings in the left side of the face but this lasted only a few hours and recovered.  She denied any increased watering of the eyes lack of sensation or weakness.  Patient has no prior history of strokes, TIAs, seizures, migraines or significant neurological problems.  She had lab work on 10/07/2018 which showed LDL cholesterol of 71 mg percent.  She takes Lipitor 40 mg daily.  She had a hemoglobin A1c of 6.1 on 07/20/2018.  Her blood pressure is usually well controlled and today it is 134/77.  Her fasting sugars have all been good in the 110 range.  She also has sleep apnea and is quite compliant with her CPAP and takes it every night.  She has not had any vascular imaging studies done for the brain of the neck.  She has no neurological complaints today.  ROS:   14 system review of systems is positive for those listed in HPI and all other systems negative e and all other systems negative  PMH:  Past Medical History:  Diagnosis Date   Anemia    3 months ago anemic   Anxiety    on meds   Arthritis    all over (01/15/2017)   Asthma    Bronchitis with emphysema    Chest pain    Chronic bronchitis (HCC)    Coronary artery disease    a. 01/2017 she underwent orbital atherectomy/DES to the proxmal LAD and PTCA to ostial D2. 2D Echo 01/15/17 showed mild LVH, EF 60-65%, grade 1 DD.   Family history of anesthesia complication    daughter N/V   Fibromyalgia    GERD (gastroesophageal reflux disease)    on meds   Glaucoma    Heart murmur    History of hiatal hernia    Hx of echocardiogram    Echo (03/2013):  Tech limited; Mild focal basal septal hypertrophy, EF 60-65%, normal RVF   Hyperlipidemia    Hypertension  Nausea vomiting and diarrhea 08/24/2023   Nonsustained ventricular tachycardia (HCC)    OSA on CPAP    Premature atrial contractions    PVC (premature  ventricular contraction)    a. Holter 12/16: NSR, occ PAC,PVCs   Sarcoidosis    Type II diabetes mellitus (HCC)     Social History:  Social History   Socioeconomic History   Marital status: Married    Spouse name: Sheila Reynolds   Number of children: 2   Years of education: Not on file   Highest education level: Not on file  Occupational History   Occupation: unemployed    Employer: [employer]  Tobacco Use   Smoking status: Former    Current packs/day: 0.00    Average packs/day: 1 pack/day for 4.0 years (4.0 ttl pk-yrs)    Types: Cigarettes    Start date: 02/11/1976    Quit date: 02/11/1980    Years since quitting: 43.5   Smokeless tobacco: Never  Vaping Use   Vaping status: Never Used  Substance and Sexual Activity   Alcohol  use: No   Drug use: No   Sexual activity: Yes  Other Topics Concern   Not on file  Social History Narrative   Lives with husband   Right hand   Drinks 2-3 cups caffeine daily   Social Drivers of Health   Financial Resource Strain: Low Risk  (06/09/2023)   Overall Financial Resource Strain (CARDIA)    Difficulty of Paying Living Expenses: Not hard at all  Food Insecurity: No Food Insecurity (08/12/2023)   Hunger Vital Sign    Worried About Running Out of Food in the Last Year: Never true    Ran Out of Food in the Last Year: Never true  Transportation Needs: No Transportation Needs (08/12/2023)   PRAPARE - Administrator, Civil Service (Medical): No    Lack of Transportation (Non-Medical): No  Physical Activity: Insufficiently Active (06/09/2023)   Exercise Vital Sign    Days of Exercise per Week: 2 days    Minutes of Exercise per Session: 30 min  Stress: No Stress Concern Present (06/09/2023)   [employer] of Occupational Health - Occupational Stress Questionnaire    Feeling of Stress : Not at all  Recent Concern: Stress - Stress Concern Present (05/06/2023)   [employer] of Occupational Health - Occupational Stress  Questionnaire    Feeling of Stress : To some extent  Social Connections: Unknown (08/10/2023)   Social Connection and Isolation Panel    Frequency of Communication with Friends and Family: More than three times a week    Frequency of Social Gatherings with Friends and Family: More than three times a week    Attends Religious Services: Not on file    Active Member of Clubs or Organizations: Yes    Attends Banker Meetings: Patient unable to answer    Marital Status: Not on file  Intimate Partner Violence: Not At Risk (08/12/2023)   Humiliation, Afraid, Rape, and Kick questionnaire    Fear of Current or Ex-Partner: No    Emotionally Abused: No    Physically Abused: No    Sexually Abused: No    Medications:   Current Outpatient Medications on File Prior to Visit  Medication Sig Dispense Refill   acetaminophen  (TYLENOL ) 500 MG tablet Take 1,000 mg by mouth 2 (two) times daily as needed for moderate pain or headache.     albuterol  (PROVENTIL ) (2.5 MG/3ML) 0.083% nebulizer solution Take 3 mLs (  2.5 mg total) by nebulization daily as needed for wheezing or shortness of breath. 75 mL 12   albuterol  (VENTOLIN  HFA) 108 (90 Base) MCG/ACT inhaler Inhale 2 puffs into the lungs every 6 (six) hours as needed for wheezing or shortness of breath. 18 g 12   amoxicillin -clavulanate (AUGMENTIN ) 875-125 MG tablet Take 1 tablet by mouth 2 (two) times daily. 8 tablet 0   aspirin  EC 81 MG tablet Take 81 mg by mouth daily. Swallow whole. (Patient not taking: Reported on 08/12/2023)     atorvastatin  (LIPITOR) 80 MG tablet Take 1 tablet (80 mg total) by mouth daily. 90 tablet 3   Azathioprine  75 MG TABS Take 1 tablet by mouth daily.     Blood Glucose Monitoring Suppl (ACCU-CHEK AVIVA PLUS) w/Device KIT Use to check blood sugars 3 times a day. Dx code e11.65 1 kit 3   Blood Glucose Monitoring Suppl (ACCU-CHEK GUIDE) w/Device KIT USE AS NEEDED TO CHECK BLOOD SUGARS. 1 kit 0   brimonidine  (ALPHAGAN  P) 0.1 %  SOLN Place 1 drop into both eyes in the morning, at noon, and at bedtime.     brinzolamide  (AZOPT ) 1 % ophthalmic suspension Place 1 drop into both eyes 3 (three) times daily.     carvedilol  (COREG ) 12.5 MG tablet Take 1 tablet (12.5 mg total) by mouth 2 (two) times daily with a meal. 180 tablet 3   EPINEPHrine  0.3 mg/0.3 mL IJ SOAJ injection Inject 0.3 mg into the muscle as needed for anaphylaxis. 1 each 0   ezetimibe  (ZETIA ) 10 MG tablet Take 1 tablet (10 mg total) by mouth daily. 90 tablet 1   glucose blood (ACCU-CHEK GUIDE TEST) test strip Use as instructed to check blood sugars E11.9 200 each 1   isosorbide  mononitrate (IMDUR ) 60 MG 24 hr tablet TAKE 1 TABLET(60 MG) BY MOUTH DAILY 90 tablet 3   JANUMET  50-500 MG tablet TAKE 1 TABLET BY MOUTH TWICE DAILY WITH A MEAL 90 tablet 1   ketorolac  (ACULAR ) 0.5 % ophthalmic solution Place 1 drop into the right eye 2 (two) times daily.     Lancets (ONETOUCH DELICA PLUS LANCET33G) MISC CHECK BLOOD SUGAR BEFORE BREAKFAST AND DINNER 100 each 1   montelukast  (SINGULAIR ) 10 MG tablet Take 1 tablet (10 mg total) by mouth daily. 90 tablet 1   nitroGLYCERIN  (NITROSTAT ) 0.4 MG SL tablet Place 1 tablet (0.4 mg total) under the tongue every 5 (five) minutes as needed for chest pain. 25 tablet prn   pantoprazole  (PROTONIX ) 40 MG tablet Take 1 tablet (40 mg total) by mouth daily. 90 tablet 3   Polyvinyl Alcohol -Povidone PF (REFRESH) 1.4-0.6 % SOLN Place 1-2 drops into both eyes 3 (three) times daily as needed (dry/irritated eyes.).     prednisoLONE  acetate (PRED FORTE ) 1 % ophthalmic suspension Place 1 drop into both eyes in the morning, at noon, and at bedtime. Place 2 Drops in Left Eye, Place 3 Drops In Right Eye TID     pregabalin  (LYRICA ) 75 MG capsule TAKE 1 CAPSULE(75 MG) BY MOUTH TWICE DAILY 180 capsule 1   No current facility-administered medications on file prior to visit.    Allergies:   Allergies  Allergen Reactions   Crestor [Rosuvastatin Calcium ]  Other (See Comments)    muscle aches   Demerol  [Meperidine Hcl]     Other reaction(s): Hallucinations   Shellfish Allergy Itching and Other (See Comments)    Crab, shrimp and lobster ---lips itch and tingle Was told not to eat  again after having a allergy test. Lobster, crab and shrimp     Physical Exam There were no vitals filed for this visit.  There is no height or weight on file to calculate BMI.   General: well developed, well nourished pleasant elderly African-American lady, seated, in no evident distress Head: head normocephalic and atraumatic.   Neck: supple with no carotid or supraclavicular bruits Cardiovascular: regular rate and rhythm, no murmurs Musculoskeletal: no deformity.   Skin:  no rash/petichiae Vascular:  Normal pulses all extremities  Neurologic Exam Mental Status: Awake and fully alert.  Fluent speech and language.  Oriented to place and time. Recent memory subjectively impaired and remote memory intact. Attention span, concentration and fund of knowledge appropriate during visit.  Provided extensive history and lots of information during visit without difficulty.  Mood and affect flat Cranial Nerves: Pupils equal, briskly reactive to light. Extraocular movements full without nystagmus. Visual fields full to confrontation.  Diminished vision acuity bilaterally (chronic).  Hearing slightly diminished bilaterally. Facial sensation intact. Face, tongue, palate moves normally and symmetrically.  Motor: Normal bulk and tone. Normal strength in all tested extremity muscles. Sensory.: intact to touch , pinprick , position and vibratory sensation.   Coordination: Rapid alternating movements normal in all extremities. Finger-to-nose and heel-to-shin performed accurately bilaterally. Gait and Station: Arises from chair without difficulty. Stance is normal. Gait demonstrates normal stride length and mild imbalance with use of cane.  Reflexes: 1+ and symmetric. Toes  downgoing.      02/16/2023   12:07 PM  Montreal Cognitive Assessment   Visuospatial/ Executive (0/5) 4  Naming (0/3) 3  Attention: Read list of digits (0/2) 2  Attention: Read list of letters (0/1) 1  Attention: Serial 7 subtraction starting at 100 (0/3) 3  Language: Repeat phrase (0/2) 2  Language : Fluency (0/1) 1  Abstraction (0/2) 2  Delayed Recall (0/5) 4  Orientation (0/6) 6  Total 28      05/15/2022    1:20 PM 09/18/2021    7:46 AM 04/03/2020    1:22 PM  MMSE - Mini Mental State Exam  Orientation to time 4 5 5   Orientation to Place 5 5 5   Registration 2 3 3   Attention/ Calculation 5 5 3   Recall 3 3 2   Language- name 2 objects 2 2 2   Language- repeat 1 1 1   Language- follow 3 step command 3 3 3   Language- read & follow direction 1 1 1   Write a sentence 1 1 1   Copy design 0 1 1  Total score 27 30 27        IMAGING/LABS  MR brain w wo 04/2020 -chronic left thalamic lacunar infarct -negative for acute findings MR cervical 04/2020 - stable, mild disc bulging at C3-4 down to C6-7 EEG 04/2020  unremarkable Lab work 04/2020 WNL (sed rate, dementia panel, lipid panel and A1c)        ASSESSMENT/PLAN: 78 year old African-American lady with asymptomatic left subcortical lacunar infarct in August 2020 discovered at the time of brain imaging for vision difficulties which is unrelated and likely from glaucoma and eye problem.  Vascular risk factors of hypertension, hyperlipidemia, diabetes and sleep apnea with CPAP noncompliance.  At 03/2020 visit, new complaints of worsening balance, right hand numbness, memory loss, headaches -extensive evaluation unremarkable but continued to experience frontal and left orbital headaches.   Episodes of transient altered mental status with staring and unresponsiveness possible complex partial seizures and started on Keppra  but patient self d/c'd after  a couple months without any recurrent events.  She also has mild memory and cognitive  difficulties likely multifactorial per neuropsychology.  Prior complaints in 11/2022 of daily headache and dizziness following syncopal event with postconcussive symptoms which gradually improved.  She continues to have syncopal events likely due to vasovagal syncope.      -main concern today is regarding short term memory difficulties, will place referral to neuropsych for neurocognitive testing especially as prior MMSE's and recent MOCA WNL. Suspect multifactoral cause of memory complaints including prior stroke, noncompliance with CPAP, suboptimal control of depression/anxiety, polypharmacy and being sedentary. Discussed all of this at length today, advised to use CPAP nightly, increase physical and cognitive exercises and f/u with PCP if depression/anxiety continues to be a concern. Can consider use of Aricept or Namenda in the future as needed but would prefer to hold off at this time.  -in regards to return to driving, would recommend waiting until neurocognitive evaluation or undergo driving evaluation as husband has concerns regarding her returning back to driving with her memory. Discussed reaction time and multitasking can be impaired with memory difficulties. She verbalized understanding.  -No additional seizure-like activity, EEG 01/2023 no evidence of seizures, can hold off on restarted ASM unless she has more seizure-like events - she was advised to call if this should occur  -continue to follow with PCP for stroke risk factor management          I personally spent a total of *** minutes in the care of the patient today including {Time Based Coding:[id number]}.   Harlene Bogaert, AGNP-BC  [facility] [address] [place], [place] [address]  Phone [phone] Fax [phone] Note: This document was prepared with digital dictation and possible smart phrase technology. Any transcriptional errors that result from this process are  unintentional.

## 2023-08-24 NOTE — Assessment & Plan Note (Signed)
 These sx have since resolved. She is encouraged to consider coconut water or an electrolyte drink like Pedialyte if her sx return.

## 2023-08-24 NOTE — Patient Instructions (Signed)
 Medication Instructions:   START Tessalon  1-2 gel caps as needed three times per day for cough  START Guafenesin (Muccinex) 600mg  twice daily  This is an expectorant to help loosen cough  *If you need a refill on your cardiac medications before your next appointment, please call your pharmacy*  Follow-Up: At [facility], you and your health needs are our priority.  As part of our continuing mission to provide you with exceptional heart care, our providers are all part of one team.  This team includes your primary Cardiologist (physician) and Advanced Practice Providers or APPs (Physician Assistants and Nurse Practitioners) who all work together to provide you with the care you need, when you need it.  Your next appointment:   6 month(s)  Provider:   Annabella Scarce, MD, Rosaline Bane, NP, or Reche Finder, NP    We recommend signing up for the patient portal called MyChart.  Sign up information is provided on this After Visit Summary.  MyChart is used to connect with patients for Virtual Visits (Telemedicine).  Patients are able to view lab/test results, encounter notes, upcoming appointments, etc.  Non-urgent messages can be sent to your provider as well.   To learn more about what you can do with MyChart, go to [email or web address].   Other Instructions  We will reach out to Dr. Geronimo about getting your follow up rescheduled.      Pursed Lip Breathing Pursed lip breathing is a technique to relieve the feeling of being short of breath.  Being short of breath can make you tense and anxious. Before you start this breathing exercise, take a minute to relax your shoulders and close your eyes. Then: Start the exercise by closing your mouth. Breathe in through your nose, taking a normal breath. You can do this at your normal rate of breathing. If you feel you are not getting enough air, breathe in while slowly counting to 2 or 3. Pucker (purse) your lips as if you  were going to whistle. Gently tighten the muscles of your abdomenor press on your abdomen to help push the air out. Breathe out slowly through your pursed lips. Take at least twice as long to breathe out as it takes you to breathe in. Make sure that you breathe out all of the air, but do not force air out.

## 2023-08-24 NOTE — Progress Notes (Signed)
 Cardiology Office Note   Date:  08/24/2023  ID:  Sheila, Reynolds November 04, 1945, MRN [id number] PCP: [name], MD  [facility] Providers Cardiologist:  Annabella Scarce, MD     History of Present Illness Sheila Reynolds is a 78 y.o. female with hx of CAD (s/p orbital atherectomy and PCI/DES to prox/mid LAD), HLD, palpitations, NSVT, sarcoidosis, pulmonary fibrosis   [facility] 12/2016 significant single vessel coronary disease with long, calcified prox/mid LAD stenosis upto 70% and 70% ostial D2 stenosis, mild pulmonary hypertension. Subsequently underwent arterectomy 01/15/17. Cardiac PET 03/2022 normal LVEF 58%, low risk with no ischemia nor infarction. Carotid duplex bilateral 1-39% stenosis.    She had ED visit 10/22-10/26/24 with syncope. Syncope occurred while standing eatinga  sandwich after returning to her house from voting. She did have prodrome of feeling weak, tired. She did have 2 similar episodes prior where she nearly blacked out. Echo during admission LVEF 60-65%, no RWMA, no significant valvular abnormalities. Carotid duplex bilateral 1-39% stenosis. Also treated for UTI.    ZIO placed 02/22/13 due to syncope with predominantly NSR, one 6 beat asymptomatic run of SVT, no serious arrhythmias. At visit 05/04/23 with Dr. Scarce syncope fel tot be consistent with vasovagal syncope. Encouraged to increase fluid intake, wear TED hose. Carvedilol  dose reduced. Clopidogrel  stopped as one year since stent.   Admitted 6/29-08/11/23 with syncope felt to be related to dehydration in settin gof nausea, vomiting, diarrhea. Treated for community acquired PNA and UTI. Carotid duplex 08/10/23 bilateral 1-39% stenosis. Echo 08/11/23 LVEF 55-60%, no RWMA, no significant valvular abnormalities. She was discharged with home health PT.  Discussed the use of AI scribe software for clinical note transcription with the patient, who gave verbal consent to proceed.  History of Present  Illness Sheila Reynolds is a 78 year old female with pulmonary fibrosis who presents with persistent cough and shortness of breath following a recent hospitalization for pneumonia.  She experiences persistent shortness of breath and fatigue, with a constant cough producing clear mucus, occasionally with yellow chunks. The cough is sometimes choking and occurs throughout the day. Despite antibiotic treatment for pneumonia during her recent hospitalization, these symptoms persist. She experiences lightheadedness and dizziness, particularly during severe coughing fits, and has had episodes requiring her to lie down due to lightheadedness as previously instructed. No near syncope, syncope. She is concerned about being home alone during these episodes. She has pulmonary fibrosis and experiences tingling pain on the side of her chest, which she associates with her recent illness. No exertional chest discomfort. Reviewed reassuring echocardiogram during recent admission.    ROS: Please see the history of present illness.    All other systems reviewed and are negative.   Studies Reviewed      Cardiac Studies & Procedures   ______________________________________________________________________________________________ CARDIAC CATHETERIZATION  CARDIAC CATHETERIZATION 01/15/2017  Conclusion Conclusions: 1. 70% proximal/mid LAD stenosis (previously found to be hemodynamically significant with resting Pd/Pa of 0.79). 2. 70% ostial D2 stenosis. 3. Successful orbital atherectomy and PCI using Xience Sierra 3.5 x 33 mm DES with 10% proximal stenosis (post-dilated with 4.0 [place] balloon at high pressure) and TIMI-3 flow. 4. Successful PTCA to ostial D2 (including kissing balloon inflation) with reduction in stenosis to 50% and TIMI-3 flow.  Recommendations: 1. Dual antiplatelet therapy with aspirin  and clopidogrel  for at least 6 months, ideally longer. 2. Aggressive secondary prevention. 3. Overnight  observation and cardiac rehab after discharge.  [name], MD [facility] Pager: [phone]  Findings Coronary Findings Diagnostic  Dominance: Co-dominant  Left Main Vessel is large.  Left Anterior Descending Prox LAD to Mid LAD lesion is 70% stenosed.  First Diagonal Branch Vessel is small in size.  Second Diagonal Branch Vessel is moderate in size. Ost 2nd Diag lesion is 70% stenosed.  Left Circumflex Vessel is large.  First Obtuse Marginal Branch Vessel is moderate in size.  Second Obtuse Marginal Branch Vessel is small in size.  Third Obtuse Marginal Branch Vessel is moderate in size.  Fourth Obtuse Marginal Branch Vessel is small in size.  Intervention  Prox LAD to Mid LAD lesion Atherectomy Orbital atherectomy was performed. Stent Lesion length:  28 mm. A drug-eluting stent was successfully placed using a STENT SIERRA 3.50 X 33 MM. Stent strut is well apposed. Post-Intervention Lesion Assessment The intervention was successful. Pre-interventional TIMI flow is 3. Post-intervention TIMI flow is 3. No complications occurred at this lesion. There is a 10% residual stenosis post intervention.  Ost 2nd Diag lesion Angioplasty Balloon angioplasty was performed. Post-Intervention Lesion Assessment The intervention was successful. Pre-interventional TIMI flow is 3. Post-intervention TIMI flow is 3. There is a 50% residual stenosis post intervention.   STRESS TESTS  NM PET CT CARDIAC PERFUSION MULTI W/ABSOLUTE BLOODFLOW 03/18/2022  Narrative   The study is normal. The study is low risk.   LV perfusion is normal. There is no evidence of ischemia. There is no evidence of infarction.   Rest left ventricular function is normal. Rest EF: 58 %. Stress left ventricular function is normal. Stress EF: 61 %. End diastolic cavity size is normal. End systolic cavity size is normal. No evidence of transient ischemic dilation (TID) noted.   Myocardial blood  flow was computed to be 1.6ml/g/min at rest and 2.41ml/g/min at stress. Global myocardial blood flow reserve was 2.10 and was normal.   Coronary calcium  assessment not performed due to prior revascularization.   Electronically signed by: Soyla DELENA Merck, MD  CLINICAL DATA:  This over-read does not include interpretation of cardiac or coronary anatomy or pathology. The Cardiac PET CT interpretation by the cardiologist is attached.  COMPARISON:  None Available.  FINDINGS: Vascular: Normal heart size. No pericardial effusion. Normal caliber thoracic aorta with mild calcified plaque. Coronary artery calcifications with probable LAD stent.  Mediastinum/Nodes: Esophagus is unremarkable. No pathologically enlarged lymph nodes seen in the chest.  Lungs/Pleura: Central airways are patent. Bibasilar atelectasis. No consolidation, pleural effusion or pneumothorax.  Upper Abdomen: No acute abnormality.  Musculoskeletal: Tiny sclerotic lesions of the lumbar spine, likely bone islands. No aggressive appearing osseous lesions.  IMPRESSION: 1. No acute extracardiac abnormality. 2. Aortic Atherosclerosis (ICD10-I70.0).   Electronically Signed By: Rea Marc M.D. On: 03/18/2022 16:17   ECHOCARDIOGRAM  ECHOCARDIOGRAM COMPLETE 08/11/2023  Narrative ECHOCARDIOGRAM REPORT    Patient Name:   EVIAN DERRINGER Date of Exam: 08/11/2023 Medical Rec #:  [id number]          Height:       62.0 in Accession #:    [id number]         Weight:       167.0 lb Date of Birth:  11-11-1945          BSA:          1.771 m Patient Age:    78 years           BP:           172/88 mmHg Patient Gender: F  HR:           74 bpm. Exam Location:  Inpatient  Procedure: 2D Echo, Cardiac Doppler and Color Doppler (Both Spectral and Color Flow Doppler were utilized during procedure).  Indications:    Syncope R55  History:        Patient has prior history of Echocardiogram examinations,  most recent 12/04/2022. CHF, Angina and CAD, COPD, Arrythmias:PVC, Signs/Symptoms:Chest Pain and Syncope; Risk Factors:Hypertension, Sleep Apnea, Dyslipidemia and Diabetes.  Sonographer:    Thea Norlander RCS Referring Phys: [phone] EKTA V PATEL  IMPRESSIONS   1. Apical views could not be obtained. Left ventricular ejection fraction, by estimation, is 55 to 60%. The left ventricle has normal function. The left ventricle has no regional wall motion abnormalities. Left ventricular diastolic function could not be evaluated. 2. Right ventricular systolic function is normal. The right ventricular size is normal. 3. The mitral valve is normal in structure. No evidence of mitral valve regurgitation. No evidence of mitral stenosis. 4. The aortic valve is normal in structure. Aortic valve regurgitation is not visualized. No aortic stenosis is present. 5. The inferior vena cava is normal in size with greater than 50% respiratory variability, suggesting right atrial pressure of 3 mmHg.  Comparison(s): No significant change from prior study. Prior images reviewed side by side.  FINDINGS Left Ventricle: Apical views could not be obtained. Left ventricular ejection fraction, by estimation, is 55 to 60%. The left ventricle has normal function. The left ventricle has no regional wall motion abnormalities. The left ventricular internal cavity size was normal in size. There is no left ventricular hypertrophy. Left ventricular diastolic function could not be evaluated.  Right Ventricle: The right ventricular size is normal. No increase in right ventricular wall thickness. Right ventricular systolic function is normal.  Left Atrium: Left atrial size was normal in size.  Right Atrium: Right atrial size was normal in size.  Pericardium: There is no evidence of pericardial effusion.  Mitral Valve: The mitral valve is normal in structure. No evidence of mitral valve regurgitation. No evidence of mitral  valve stenosis.  Tricuspid Valve: The tricuspid valve is normal in structure. Tricuspid valve regurgitation is not demonstrated. No evidence of tricuspid stenosis.  Aortic Valve: The aortic valve is normal in structure. Aortic valve regurgitation is not visualized. No aortic stenosis is present.  Pulmonic Valve: The pulmonic valve was normal in structure. Pulmonic valve regurgitation is not visualized. No evidence of pulmonic stenosis.  Aorta: The aortic root is normal in size and structure.  Venous: The inferior vena cava is normal in size with greater than 50% respiratory variability, suggesting right atrial pressure of 3 mmHg.  IAS/Shunts: No atrial level shunt detected by color flow Doppler.   LEFT VENTRICLE PLAX 2D LVIDd:         3.60 cm LVIDs:         2.70 cm LV PW:         1.10 cm LV IVS:        1.10 cm LVOT diam:     1.90 cm LV SV:         33 LV SV Index:   19 LVOT Area:     2.84 cm   IVC IVC diam: 2.00 cm  LEFT ATRIUM           Index        RIGHT ATRIUM           Index LA diam:      3.00 cm  1.69 cm/m   RA Area:     11.60 cm LA Vol (A4C): 48.7 ml 27.50 ml/m  RA Volume:   20.20 ml  11.41 ml/m AORTIC VALVE LVOT Vmax:   63.90 cm/s LVOT Vmean:  42.400 cm/s LVOT VTI:    0.118 m  AORTA Ao Root diam: 3.00 cm Ao Asc diam:  2.90 cm   SHUNTS Systemic VTI:  0.12 m Systemic Diam: 1.90 cm  Jerel Croitoru MD Electronically signed by Jerel Balding MD Signature Date/Time: 08/11/2023/2:12:00 PM    Final    MONITORS  LONG TERM MONITOR (3-14 DAYS) 03/10/2023  Narrative   Predominant rhythm is sinus rhythm.  She had 1 episode of SVT which lasted 6 beats with a maximum heart rate of 154 and an average heart rate of 143.   Rare premature atrial contractions, rare premature ventricular contractions   No triggered events are present   There were serious arrhythmias.   Patch Wear Time:  11 days and 23 hours (2025-01-13T09:06:17-0500 to  2025-01-25T08:24:25-0500)  Patient had a min HR of 58 bpm, max HR of 154 bpm, and avg HR of 84 bpm. Predominant underlying rhythm was Sinus Rhythm. 1 run of Supraventricular Tachycardia occurred lasting 6 beats with a max rate of 154 bpm (avg 143 bpm). Isolated SVEs were rare (<1.0%), SVE Couplets were rare (<1.0%), and SVE Triplets were rare (<1.0%). Isolated VEs were rare (<1.0%), VE Couplets were rare (<1.0%), and no VE Triplets were present.       ______________________________________________________________________________________________      Risk Assessment/Calculations           Physical Exam VS:  BP 138/82   Pulse 78   Ht 5' 2 (1.575 m)   Wt 166 lb (75.3 kg)   SpO2 95%   BMI 30.36 kg/m        Wt Readings from Last 3 Encounters:  08/24/23 166 lb (75.3 kg)  08/17/23 166 lb (75.3 kg)  07/31/23 167 lb (75.8 kg)    GEN: Well nourished, well developed in no acute distress NECK: No JVD; No carotid bruits CARDIAC: RRR, no murmurs, rubs, gallops RESPIRATORY:  Clear to auscultation with bilateral lobes diminished without rales, wheezing or rhonchi  ABDOMEN: Soft, non-tender, non-distended EXTREMITIES:  No edema; No deformity   ASSESSMENT AND PLAN  Assessment & Plan Pneumonia / Sarcoidosis / ILD / Lung nodules Recent hospitalization with persistent cough and mucus production. Recovery prolonged due to underlying respiratory issues - Prescribed Tessalon  Perles for cough, up to three times daily. - Recommended over-the-counter Mucinex  (regular, not DM), extended release preferred for twice daily dosing. - Will route to Dr. Geronimo for assistance in scheduling CT and pulmonary follow up   Hx of syncope Deemed vasovagal as prior monitor, echocardiogram unremarkable. Carotid duplex 07/2023 mild bilateral 1-39% stenosis. Encouraged adequate hydration, compression stockings, slow position changes.   Carotid stenosis 07/2023 bilateral 1-39% stenosis. Continue Atorvastatin   80mg  daily, Zetia  10mg  daily.. Consider repeat study in 1 year, can be coordinated at follow up   CAD / HLD, LDL goal <70 Stable with no anginal symptoms. No indication for ischemic evaluation.  GDMT aspirin  81mg  daily,a torvastatin 80mg  daily, zetia  10mg  daily.  Tingling chest pain Likely due to nerve irritation from persistent coughing. No cardiac cause identified. - Monitor symptoms; expect improvement as cough resolves.         Dispo: follow up in 6 months with Dr. Raford or APP  Signed, Reche GORMAN Finder, NP

## 2023-08-24 NOTE — Assessment & Plan Note (Signed)
 Chronic, she is currently on Janumet. Importance of dietary and medication compliance was discussed with the patient. LDL goal is less than 70, she will continue with atorvastatin 80mg  daily and ezetimibe 10mg  daily. She is encouraged to follow heart healthy lifestyle.

## 2023-08-24 NOTE — Assessment & Plan Note (Signed)
 She has been referred to PT by hospitalist, but states she has yet to hear from them. Will refer her if needed. - Importance of adequate protein intake to help with muscle strengthening was discussed with the patient.

## 2023-08-24 NOTE — Assessment & Plan Note (Signed)
 Decreased interest, mood changes, and altered sleep and appetite likely related to recent illness. No medication needed currently. - Monitor mood and consider mental health support if symptoms persist.

## 2023-08-25 ENCOUNTER — Encounter: Payer: Self-pay | Admitting: Adult Health

## 2023-08-25 ENCOUNTER — Ambulatory Visit (INDEPENDENT_AMBULATORY_CARE_PROVIDER_SITE_OTHER): Admitting: Adult Health

## 2023-08-25 ENCOUNTER — Encounter (HOSPITAL_BASED_OUTPATIENT_CLINIC_OR_DEPARTMENT_OTHER): Admitting: Physical Therapy

## 2023-08-25 VITALS — BP 124/82 | HR 78 | Ht 62.0 in | Wt 163.0 lb

## 2023-08-25 DIAGNOSIS — Z8673 Personal history of transient ischemic attack (TIA), and cerebral infarction without residual deficits: Secondary | ICD-10-CM | POA: Diagnosis not present

## 2023-08-25 DIAGNOSIS — G44209 Tension-type headache, unspecified, not intractable: Secondary | ICD-10-CM | POA: Diagnosis not present

## 2023-08-25 DIAGNOSIS — R413 Other amnesia: Secondary | ICD-10-CM

## 2023-08-25 DIAGNOSIS — Z87898 Personal history of other specified conditions: Secondary | ICD-10-CM | POA: Diagnosis not present

## 2023-08-25 NOTE — Addendum Note (Signed)
 Addended by: WHITFIELD RAISIN L on: 08/25/2023 10:13 AM   Modules accepted: Level of Service

## 2023-08-25 NOTE — Patient Instructions (Addendum)
 Your Plan:  No changes today - ensure you are routinely doing cognitive and physical exercises as well as ensuring good sleep, compliance with CPAP, healthy diet and management of stroke risk factors  Please restart CPAP once able as untreated apnea can be contributing to your memory complaints as well as increase risk of recurrent strokes and heart disease  Continue aspirin , atorvastatin  and zetia  for stroke prevention and continue routine follow up with your PCP for stroke risk factor management   Please follow up with cardiology regarding ability to return back to driving in setting of recent syncopal event       Follow up as needed at this time       Thank you for coming to see us  at [facility]. I hope we have been able to provide you high quality care today.  You may receive a patient satisfaction survey over the next few weeks. We would appreciate your feedback and comments so that we may continue to improve ourselves and the health of our patients.

## 2023-08-26 ENCOUNTER — Telehealth: Payer: Self-pay

## 2023-08-26 DIAGNOSIS — E119 Type 2 diabetes mellitus without complications: Secondary | ICD-10-CM | POA: Diagnosis not present

## 2023-08-26 DIAGNOSIS — J45909 Unspecified asthma, uncomplicated: Secondary | ICD-10-CM | POA: Diagnosis not present

## 2023-08-26 DIAGNOSIS — J189 Pneumonia, unspecified organism: Secondary | ICD-10-CM | POA: Diagnosis not present

## 2023-08-26 DIAGNOSIS — N3000 Acute cystitis without hematuria: Secondary | ICD-10-CM | POA: Diagnosis not present

## 2023-08-26 DIAGNOSIS — I2510 Atherosclerotic heart disease of native coronary artery without angina pectoris: Secondary | ICD-10-CM | POA: Diagnosis not present

## 2023-08-26 DIAGNOSIS — I10 Essential (primary) hypertension: Secondary | ICD-10-CM | POA: Diagnosis not present

## 2023-09-01 ENCOUNTER — Ambulatory Visit: Admitting: Dietician

## 2023-09-01 DIAGNOSIS — I2510 Atherosclerotic heart disease of native coronary artery without angina pectoris: Secondary | ICD-10-CM | POA: Diagnosis not present

## 2023-09-01 DIAGNOSIS — N3000 Acute cystitis without hematuria: Secondary | ICD-10-CM | POA: Diagnosis not present

## 2023-09-01 DIAGNOSIS — J189 Pneumonia, unspecified organism: Secondary | ICD-10-CM | POA: Diagnosis not present

## 2023-09-01 DIAGNOSIS — J45909 Unspecified asthma, uncomplicated: Secondary | ICD-10-CM | POA: Diagnosis not present

## 2023-09-01 DIAGNOSIS — E119 Type 2 diabetes mellitus without complications: Secondary | ICD-10-CM | POA: Diagnosis not present

## 2023-09-01 DIAGNOSIS — I10 Essential (primary) hypertension: Secondary | ICD-10-CM | POA: Diagnosis not present

## 2023-09-02 DIAGNOSIS — J189 Pneumonia, unspecified organism: Secondary | ICD-10-CM | POA: Diagnosis not present

## 2023-09-02 DIAGNOSIS — J45909 Unspecified asthma, uncomplicated: Secondary | ICD-10-CM | POA: Diagnosis not present

## 2023-09-02 DIAGNOSIS — I10 Essential (primary) hypertension: Secondary | ICD-10-CM | POA: Diagnosis not present

## 2023-09-02 DIAGNOSIS — E119 Type 2 diabetes mellitus without complications: Secondary | ICD-10-CM | POA: Diagnosis not present

## 2023-09-02 DIAGNOSIS — N3000 Acute cystitis without hematuria: Secondary | ICD-10-CM | POA: Diagnosis not present

## 2023-09-02 DIAGNOSIS — I2510 Atherosclerotic heart disease of native coronary artery without angina pectoris: Secondary | ICD-10-CM | POA: Diagnosis not present

## 2023-09-04 DIAGNOSIS — I10 Essential (primary) hypertension: Secondary | ICD-10-CM | POA: Diagnosis not present

## 2023-09-04 DIAGNOSIS — I2510 Atherosclerotic heart disease of native coronary artery without angina pectoris: Secondary | ICD-10-CM | POA: Diagnosis not present

## 2023-09-04 DIAGNOSIS — J45909 Unspecified asthma, uncomplicated: Secondary | ICD-10-CM | POA: Diagnosis not present

## 2023-09-04 DIAGNOSIS — J189 Pneumonia, unspecified organism: Secondary | ICD-10-CM | POA: Diagnosis not present

## 2023-09-04 DIAGNOSIS — E119 Type 2 diabetes mellitus without complications: Secondary | ICD-10-CM | POA: Diagnosis not present

## 2023-09-04 DIAGNOSIS — N3000 Acute cystitis without hematuria: Secondary | ICD-10-CM | POA: Diagnosis not present

## 2023-09-08 DIAGNOSIS — I10 Essential (primary) hypertension: Secondary | ICD-10-CM | POA: Diagnosis not present

## 2023-09-08 DIAGNOSIS — J45909 Unspecified asthma, uncomplicated: Secondary | ICD-10-CM | POA: Diagnosis not present

## 2023-09-08 DIAGNOSIS — I2510 Atherosclerotic heart disease of native coronary artery without angina pectoris: Secondary | ICD-10-CM | POA: Diagnosis not present

## 2023-09-08 DIAGNOSIS — N3000 Acute cystitis without hematuria: Secondary | ICD-10-CM | POA: Diagnosis not present

## 2023-09-08 DIAGNOSIS — E119 Type 2 diabetes mellitus without complications: Secondary | ICD-10-CM | POA: Diagnosis not present

## 2023-09-08 DIAGNOSIS — J189 Pneumonia, unspecified organism: Secondary | ICD-10-CM | POA: Diagnosis not present

## 2023-09-09 ENCOUNTER — Ambulatory Visit: Payer: Self-pay

## 2023-09-09 DIAGNOSIS — E119 Type 2 diabetes mellitus without complications: Secondary | ICD-10-CM | POA: Diagnosis not present

## 2023-09-09 DIAGNOSIS — J189 Pneumonia, unspecified organism: Secondary | ICD-10-CM | POA: Diagnosis not present

## 2023-09-09 DIAGNOSIS — I2510 Atherosclerotic heart disease of native coronary artery without angina pectoris: Secondary | ICD-10-CM | POA: Diagnosis not present

## 2023-09-09 DIAGNOSIS — J45909 Unspecified asthma, uncomplicated: Secondary | ICD-10-CM | POA: Diagnosis not present

## 2023-09-09 DIAGNOSIS — I10 Essential (primary) hypertension: Secondary | ICD-10-CM | POA: Diagnosis not present

## 2023-09-09 DIAGNOSIS — N3000 Acute cystitis without hematuria: Secondary | ICD-10-CM | POA: Diagnosis not present

## 2023-09-09 NOTE — Telephone Encounter (Signed)
 FYI Only or Action Required?: FYI only for provider.  Patient was last seen in primary care on 08/17/2023 by [name], MD.  Called Nurse Triage reporting No chief complaint on file..  Symptoms began today.  Interventions attempted: Rest, hydration, or home remedies.  Symptoms are: unchanged.  Triage Disposition: See Physician Within 24 Hours  Patient/caregiver understands and will follow disposition?: Yes Reason for Disposition  [1] MODERATE pain (e.g., interferes with normal activities, limping) AND [2] high-risk adult (e.g., age > 60 years, osteoporosis, chronic steroid use)  Answer Assessment - Initial Assessment Questions Patient preferred to wait for appt tomorrow vs. Be evaluated at [facility] or ED tonight. Advised to call back with any changes. ED precautions reviewed, pt verbalized understanding.   1. MECHANISM: How did the injury happen? (e.g., twisting injury, direct blow)      Fall from standing, witnessed by granddaughter. Stated tripped/fell, denies syncope.  2. ONSET: When did the injury happen? (e.g., minutes, hours ago)      3pm today 09/09/23  3. LOCATION: Where is the injury located?      Left knee  4. APPEARANCE of INJURY: What does the injury look like?      Bruised and carpet burn  5. SEVERITY: Can you put weight on that leg? Can you walk?      Ambulatory with cane  7. PAIN: Is there pain? If Yes, ask: How bad is the pain?   What does it keep you from doing? (Scale 0-10; or none, mild, moderate, severe)     8/10  Protocols used: Knee Injury-A-AH Copied from CRM #[id number]. Topic: Clinical - Red Word Triage >> Sep 09, 2023  4:31 PM Marissa P wrote: Red Word that prompted transfer to Nurse Triage: [facility] is calling to report: Patient had a fall, injury to left knee and pain there 8/10. As well on left side hip as well.  The fall was earlier today around 3pm.

## 2023-09-10 ENCOUNTER — Ambulatory Visit
Admission: RE | Admit: 2023-09-10 | Discharge: 2023-09-10 | Disposition: A | Discharge status: Home / Self Care | Source: Ambulatory Visit | Attending: Family Medicine | Admitting: Family Medicine

## 2023-09-10 ENCOUNTER — Ambulatory Visit (INDEPENDENT_AMBULATORY_CARE_PROVIDER_SITE_OTHER): Payer: Self-pay | Admitting: Family Medicine

## 2023-09-10 ENCOUNTER — Encounter: Payer: Self-pay | Admitting: Family Medicine

## 2023-09-10 ENCOUNTER — Other Ambulatory Visit: Payer: Self-pay

## 2023-09-10 VITALS — BP 112/70 | HR 78 | Temp 97.7°F | Ht 62.0 in | Wt 163.0 lb

## 2023-09-10 DIAGNOSIS — M25551 Pain in right hip: Secondary | ICD-10-CM

## 2023-09-10 DIAGNOSIS — M25562 Pain in left knee: Secondary | ICD-10-CM | POA: Diagnosis not present

## 2023-09-10 DIAGNOSIS — W19XXXA Unspecified fall, initial encounter: Secondary | ICD-10-CM | POA: Diagnosis not present

## 2023-09-10 DIAGNOSIS — Z2821 Immunization not carried out because of patient refusal: Secondary | ICD-10-CM

## 2023-09-10 NOTE — Progress Notes (Signed)
 LILLETTE Kristeen JINNY Gladis, CMA,acting as a Neurosurgeon for Sheila Creighton, NP.,have documented all relevant documentation on the behalf of Sheila Creighton, NP,as directed by  Sheila Creighton, NP while in the presence of Sheila Creighton, NP.  Subjective:  Patient ID: Sheila Reynolds , female    DOB: October 05, 1945 , 78 y.o.   MRN: [id number]  Chief Complaint  Patient presents with   Fall    Patient presents today for a fall yesterday. Patient reports she is having some pain but she feels okay. She doeesnt think she hit her head. She reports she fell on her right hip and left knee   Knee Pain    She also has a a open spot on her left knee she reports she fell down 2 steps and onto the side walk. She reports her home health nurse put a bandage on her left knee. She reports tripping over her dogs chain.     HPI Discussed the use of AI scribe software for clinical note transcription with the patient, who gave verbal consent to proceed.  History of Present Illness     Sheila Reynolds is a 78 year old female who presents for evaluation following a fall yesterday.  She fell yesterday after getting tangled with her dog's chain, which pulled her down two steps onto a concrete sidewalk. Initially, she thought she fell on her left hip but later realized it was her right hip. She braced herself with her knee and hand, resulting in a skin abrasion on her knee.  She describes experiencing shooting pain in her knee, which was more intense yesterday, occurring every ten seconds. Her hip is described as 'really sore.' The home health care nurse applied a bandage to her knee and reported the incident, arranging an appointment with her primary care doctor.  She is currently taking Tylenol  for pain management. The wound on her knee was cleaned with peroxide , Neosporin was applied. She reports that the wound is not deep and that the pain is due to nerve endings being affected.  She recently had a hospital stay from June 28 to July 1  or 2, 2025, due to a syncope and collapse, which is why she currently receives visits from a home health care nurse, occupational therapist, and physical therapist. Cleaned the wound on her left knee and added steristrips to the wound.     Past Medical History:  Diagnosis Date   Anemia    3 months ago anemic   Anxiety    on meds   Arthritis    all over (01/15/2017)   Asthma    Bronchitis with emphysema    Chest pain    Chronic bronchitis (HCC)    Coronary artery disease    a. 01/2017 she underwent orbital atherectomy/DES to the proxmal LAD and PTCA to ostial D2. 2D Echo 01/15/17 showed mild LVH, EF 60-65%, grade 1 DD.   Family history of anesthesia complication    daughter N/V   Fibromyalgia    GERD (gastroesophageal reflux disease)    on meds   Glaucoma    Heart murmur    History of hiatal hernia    Hx of echocardiogram    Echo (03/2013):  Tech limited; Mild focal basal septal hypertrophy, EF 60-65%, normal RVF   Hyperlipidemia    Hypertension    Nausea vomiting and diarrhea 08/24/2023   Nonsustained ventricular tachycardia (HCC)    OSA on CPAP    Premature atrial contractions    PVC (  premature ventricular contraction)    a. Holter 12/16: NSR, occ PAC,PVCs   Sarcoidosis    Type II diabetes mellitus (HCC)      Family History  Problem Relation Age of Onset   Heart disease Father    Diabetes Father    Glaucoma Father    Cancer Mother        unknown type, ?lung   Heart disease Paternal Grandmother    Cancer Paternal Grandmother        unknown type   Diabetes Brother    Glaucoma Brother      Current Outpatient Medications:    acetaminophen  (TYLENOL ) 500 MG tablet, Take 1,000 mg by mouth 2 (two) times daily as needed for moderate pain or headache., Disp: , Rfl:    albuterol  (PROVENTIL ) (2.5 MG/3ML) 0.083% nebulizer solution, Take 3 mLs (2.5 mg total) by nebulization daily as needed for wheezing or shortness of breath., Disp: 75 mL, Rfl: 12   albuterol  (VENTOLIN   HFA) 108 (90 Base) MCG/ACT inhaler, Inhale 2 puffs into the lungs every 6 (six) hours as needed for wheezing or shortness of breath., Disp: 18 g, Rfl: 12   aspirin  EC 81 MG tablet, Take 81 mg by mouth daily. Swallow whole., Disp: , Rfl:    atorvastatin  (LIPITOR) 80 MG tablet, Take 1 tablet (80 mg total) by mouth daily., Disp: 90 tablet, Rfl: 3   Azathioprine  75 MG TABS, Take 1 tablet by mouth daily., Disp: , Rfl:    benzonatate  (TESSALON  PERLES) 100 MG capsule, Take 1-2 tablet as needed by mouth three times per day for cough., Disp: 30 capsule, Rfl: 1   Blood Glucose Monitoring Suppl (ACCU-CHEK AVIVA PLUS) w/Device KIT, Use to check blood sugars 3 times a day. Dx code e11.65, Disp: 1 kit, Rfl: 3   Blood Glucose Monitoring Suppl (ACCU-CHEK GUIDE) w/Device KIT, USE AS NEEDED TO CHECK BLOOD SUGARS., Disp: 1 kit, Rfl: 0   brimonidine  (ALPHAGAN  P) 0.1 % SOLN, Place 1 drop into both eyes in the morning, at noon, and at bedtime., Disp: , Rfl:    brinzolamide  (AZOPT ) 1 % ophthalmic suspension, Place 1 drop into both eyes 3 (three) times daily., Disp: , Rfl:    carvedilol  (COREG ) 12.5 MG tablet, Take 1 tablet (12.5 mg total) by mouth 2 (two) times daily with a meal., Disp: 180 tablet, Rfl: 3   EPINEPHrine  0.3 mg/0.3 mL IJ SOAJ injection, Inject 0.3 mg into the muscle as needed for anaphylaxis., Disp: 1 each, Rfl: 0   ezetimibe  (ZETIA ) 10 MG tablet, Take 1 tablet (10 mg total) by mouth daily., Disp: 90 tablet, Rfl: 1   glucose blood (ACCU-CHEK GUIDE TEST) test strip, Use as instructed to check blood sugars E11.9, Disp: 200 each, Rfl: 1   guaiFENesin  (MUCINEX ) 600 MG 12 hr tablet, Take 1 tablet (600 mg total) by mouth 2 (two) times daily., Disp: 60 tablet, Rfl: 1   isosorbide  mononitrate (IMDUR ) 60 MG 24 hr tablet, TAKE 1 TABLET(60 MG) BY MOUTH DAILY, Disp: 90 tablet, Rfl: 3   JANUMET  50-500 MG tablet, TAKE 1 TABLET BY MOUTH TWICE DAILY WITH A MEAL, Disp: 90 tablet, Rfl: 1   ketorolac  (ACULAR ) 0.5 % ophthalmic  solution, Place 1 drop into the right eye 2 (two) times daily., Disp: , Rfl:    Lancets (ONETOUCH DELICA PLUS LANCET33G) MISC, CHECK BLOOD SUGAR BEFORE BREAKFAST AND DINNER, Disp: 100 each, Rfl: 1   montelukast  (SINGULAIR ) 10 MG tablet, Take 1 tablet (10 mg total) by mouth daily.,  Disp: 90 tablet, Rfl: 1   nitroGLYCERIN  (NITROSTAT ) 0.4 MG SL tablet, Place 1 tablet (0.4 mg total) under the tongue every 5 (five) minutes as needed for chest pain., Disp: 25 tablet, Rfl: prn   pantoprazole  (PROTONIX ) 40 MG tablet, Take 1 tablet (40 mg total) by mouth daily., Disp: 90 tablet, Rfl: 3   Polyvinyl Alcohol -Povidone PF (REFRESH) 1.4-0.6 % SOLN, Place 1-2 drops into both eyes 3 (three) times daily as needed (dry/irritated eyes.)., Disp: , Rfl:    prednisoLONE  acetate (PRED FORTE ) 1 % ophthalmic suspension, Place 1 drop into both eyes in the morning, at noon, and at bedtime. Place 2 Drops in Left Eye, Place 3 Drops In Right Eye TID, Disp: , Rfl:    pregabalin  (LYRICA ) 75 MG capsule, TAKE 1 CAPSULE(75 MG) BY MOUTH TWICE DAILY, Disp: 180 capsule, Rfl: 1   Allergies  Allergen Reactions   Crestor [Rosuvastatin Calcium ] Other (See Comments)    muscle aches   Demerol  [Meperidine Hcl]     Other reaction(s): Hallucinations   Shellfish Allergy Itching and Other (See Comments)    Crab, shrimp and lobster ---lips itch and tingle Was told not to eat again after having a allergy test. Lobster, crab and shrimp      Review of Systems  Constitutional: Negative.   HENT: Negative.    Respiratory: Negative.    Cardiovascular: Negative.   Musculoskeletal:  Positive for gait problem.       Cane  Skin:  Positive for wound.       Abrasion on left knee     Today's Vitals   09/10/23 1124  BP: 112/70  Pulse: 78  Temp: 97.7 F (36.5 C)  TempSrc: Oral  Weight: 163 lb (73.9 kg)  Height: 5' 2 (1.575 m)  PainSc: 5   PainLoc: Knee   Body mass index is 29.81 kg/m.  Wt Readings from Last 3 Encounters:  09/10/23  163 lb (73.9 kg)  08/25/23 163 lb (73.9 kg)  08/24/23 166 lb (75.3 kg)    The ASCVD Risk score (Arnett DK, et al., 2019) failed to calculate for the following reasons:   Risk score cannot be calculated because patient has a medical history suggesting prior/existing ASCVD  Objective:  Physical Exam HENT:     Head: Normocephalic.  Cardiovascular:     Rate and Rhythm: Normal rate.  Pulmonary:     Effort: Pulmonary effort is normal.     Breath sounds: Normal breath sounds.  Musculoskeletal:        General: Tenderness present.  Skin:    Findings: Bruising and erythema present.  Neurological:     Mental Status: She is alert. Mental status is at baseline.  Psychiatric:        Mood and Affect: Mood normal.        Behavior: Behavior normal.         Assessment And Plan:  Acute pain of right hip Assessment & Plan: - Consider over-the-counter analgesics like Tylenol  for pain management X-ray ordered  Orders: -     DG HIP UNILAT W OR W/O PELVIS 2-3 VIEWS RIGHT; Future  Acute pain of left knee Assessment & Plan: Superficial abrasion on left knee with pain, treated with cleaning and steri strips to prevent scarring and promote healing.   Fall, initial encounter Assessment & Plan: Advised to be more careful with pets to prevent any more episodes of fall.   COVID-19 vaccination declined   Assessment & Plan Right hip with pain, no  fracture or severe injury. .  - Advise leaving the wound open to air to promote drying and healing. - Continue applying Neosporin to prevent scarring. - Avoid using adhesive bandages that may stick to the wound. - Use Tylenol  for pain management.   . Return for controlled DM check 4 months.  Patient was given opportunity to ask questions. Patient verbalized understanding of the plan and was able to repeat key elements of the plan. All questions were answered to their satisfaction.    I, Sheila Creighton, NP, have reviewed all documentation for this  visit. The documentation on 09/15/2023 for the exam, diagnosis, procedures, and orders are all accurate and complete.   IF YOU HAVE BEEN REFERRED TO A SPECIALIST, IT MAY TAKE 1-2 WEEKS TO SCHEDULE/PROCESS THE REFERRAL. IF YOU HAVE NOT HEARD FROM US /SPECIALIST IN TWO WEEKS, PLEASE GIVE US  A CALL AT [phone] X [phone].

## 2023-09-11 DIAGNOSIS — J45909 Unspecified asthma, uncomplicated: Secondary | ICD-10-CM | POA: Diagnosis not present

## 2023-09-11 DIAGNOSIS — I2510 Atherosclerotic heart disease of native coronary artery without angina pectoris: Secondary | ICD-10-CM | POA: Diagnosis not present

## 2023-09-11 DIAGNOSIS — I10 Essential (primary) hypertension: Secondary | ICD-10-CM | POA: Diagnosis not present

## 2023-09-11 DIAGNOSIS — E119 Type 2 diabetes mellitus without complications: Secondary | ICD-10-CM | POA: Diagnosis not present

## 2023-09-11 DIAGNOSIS — N3000 Acute cystitis without hematuria: Secondary | ICD-10-CM | POA: Diagnosis not present

## 2023-09-11 DIAGNOSIS — J189 Pneumonia, unspecified organism: Secondary | ICD-10-CM | POA: Diagnosis not present

## 2023-09-16 DIAGNOSIS — I2510 Atherosclerotic heart disease of native coronary artery without angina pectoris: Secondary | ICD-10-CM | POA: Diagnosis not present

## 2023-09-16 DIAGNOSIS — I10 Essential (primary) hypertension: Secondary | ICD-10-CM | POA: Diagnosis not present

## 2023-09-16 DIAGNOSIS — W19XXXA Unspecified fall, initial encounter: Secondary | ICD-10-CM | POA: Insufficient documentation

## 2023-09-16 DIAGNOSIS — M25551 Pain in right hip: Secondary | ICD-10-CM | POA: Insufficient documentation

## 2023-09-16 DIAGNOSIS — E119 Type 2 diabetes mellitus without complications: Secondary | ICD-10-CM | POA: Diagnosis not present

## 2023-09-16 DIAGNOSIS — N3000 Acute cystitis without hematuria: Secondary | ICD-10-CM | POA: Diagnosis not present

## 2023-09-16 DIAGNOSIS — J189 Pneumonia, unspecified organism: Secondary | ICD-10-CM | POA: Diagnosis not present

## 2023-09-16 DIAGNOSIS — J45909 Unspecified asthma, uncomplicated: Secondary | ICD-10-CM | POA: Diagnosis not present

## 2023-09-16 DIAGNOSIS — M25562 Pain in left knee: Secondary | ICD-10-CM | POA: Insufficient documentation

## 2023-09-16 DIAGNOSIS — Z2821 Immunization not carried out because of patient refusal: Secondary | ICD-10-CM | POA: Insufficient documentation

## 2023-09-16 NOTE — Assessment & Plan Note (Signed)
-   Consider over-the-counter analgesics like Tylenol  for pain management X-ray ordered

## 2023-09-16 NOTE — Assessment & Plan Note (Signed)
 Advised to be more careful with pets to prevent any more episodes of fall.

## 2023-09-16 NOTE — Assessment & Plan Note (Signed)
 Superficial abrasion on left knee with pain, treated with cleaning and steri strips to prevent scarring and promote healing.

## 2023-09-17 DIAGNOSIS — K219 Gastro-esophageal reflux disease without esophagitis: Secondary | ICD-10-CM | POA: Diagnosis not present

## 2023-09-17 DIAGNOSIS — J189 Pneumonia, unspecified organism: Secondary | ICD-10-CM | POA: Diagnosis not present

## 2023-09-17 DIAGNOSIS — I10 Essential (primary) hypertension: Secondary | ICD-10-CM | POA: Diagnosis not present

## 2023-09-17 DIAGNOSIS — M6281 Muscle weakness (generalized): Secondary | ICD-10-CM | POA: Diagnosis not present

## 2023-09-17 DIAGNOSIS — Z9181 History of falling: Secondary | ICD-10-CM | POA: Diagnosis not present

## 2023-09-17 DIAGNOSIS — J45909 Unspecified asthma, uncomplicated: Secondary | ICD-10-CM | POA: Diagnosis not present

## 2023-09-17 DIAGNOSIS — E119 Type 2 diabetes mellitus without complications: Secondary | ICD-10-CM | POA: Diagnosis not present

## 2023-09-17 DIAGNOSIS — G4733 Obstructive sleep apnea (adult) (pediatric): Secondary | ICD-10-CM | POA: Diagnosis not present

## 2023-09-17 DIAGNOSIS — Z8673 Personal history of transient ischemic attack (TIA), and cerebral infarction without residual deficits: Secondary | ICD-10-CM | POA: Diagnosis not present

## 2023-09-17 DIAGNOSIS — N3000 Acute cystitis without hematuria: Secondary | ICD-10-CM | POA: Diagnosis not present

## 2023-09-17 DIAGNOSIS — Z7984 Long term (current) use of oral hypoglycemic drugs: Secondary | ICD-10-CM | POA: Diagnosis not present

## 2023-09-17 DIAGNOSIS — Z7982 Long term (current) use of aspirin: Secondary | ICD-10-CM | POA: Diagnosis not present

## 2023-09-17 DIAGNOSIS — I2510 Atherosclerotic heart disease of native coronary artery without angina pectoris: Secondary | ICD-10-CM | POA: Diagnosis not present

## 2023-09-17 DIAGNOSIS — M797 Fibromyalgia: Secondary | ICD-10-CM | POA: Diagnosis not present

## 2023-09-17 DIAGNOSIS — E785 Hyperlipidemia, unspecified: Secondary | ICD-10-CM | POA: Diagnosis not present

## 2023-09-17 DIAGNOSIS — R55 Syncope and collapse: Secondary | ICD-10-CM | POA: Diagnosis not present

## 2023-09-24 ENCOUNTER — Other Ambulatory Visit: Payer: Self-pay | Admitting: Internal Medicine

## 2023-09-24 ENCOUNTER — Ambulatory Visit (INDEPENDENT_AMBULATORY_CARE_PROVIDER_SITE_OTHER): Admitting: Internal Medicine

## 2023-09-24 ENCOUNTER — Encounter: Payer: Self-pay | Admitting: Internal Medicine

## 2023-09-24 VITALS — BP 130/80 | HR 85 | Temp 97.8°F | Ht 62.0 in | Wt 163.0 lb

## 2023-09-24 DIAGNOSIS — N3000 Acute cystitis without hematuria: Secondary | ICD-10-CM | POA: Diagnosis not present

## 2023-09-24 DIAGNOSIS — J4489 Other specified chronic obstructive pulmonary disease: Secondary | ICD-10-CM

## 2023-09-24 DIAGNOSIS — J189 Pneumonia, unspecified organism: Secondary | ICD-10-CM | POA: Diagnosis not present

## 2023-09-24 DIAGNOSIS — I2510 Atherosclerotic heart disease of native coronary artery without angina pectoris: Secondary | ICD-10-CM | POA: Diagnosis not present

## 2023-09-24 DIAGNOSIS — E119 Type 2 diabetes mellitus without complications: Secondary | ICD-10-CM | POA: Diagnosis not present

## 2023-09-24 DIAGNOSIS — J45909 Unspecified asthma, uncomplicated: Secondary | ICD-10-CM | POA: Diagnosis not present

## 2023-09-24 DIAGNOSIS — I10 Essential (primary) hypertension: Secondary | ICD-10-CM | POA: Diagnosis not present

## 2023-09-24 DIAGNOSIS — N3001 Acute cystitis with hematuria: Secondary | ICD-10-CM | POA: Diagnosis not present

## 2023-09-24 LAB — POCT URINALYSIS DIP (CLINITEK)
Bilirubin, UA: NEGATIVE
Glucose, UA: NEGATIVE mg/dL
Ketones, POC UA: NEGATIVE mg/dL
Nitrite, UA: POSITIVE — AB
POC PROTEIN,UA: NEGATIVE
Spec Grav, UA: 1.015 (ref 1.010–1.025)
Urobilinogen, UA: 0.2 U/dL
pH, UA: 6 (ref 5.0–8.0)

## 2023-09-24 MED ORDER — AMOXICILLIN-POT CLAVULANATE 875-125 MG PO TABS
1.0000 | ORAL_TABLET | Freq: Two times a day (BID) | ORAL | 0 refills | Status: DC
Start: 2023-09-24 — End: 2023-12-24

## 2023-09-24 MED ORDER — CEFTRIAXONE SODIUM 500 MG IJ SOLR
500.0000 mg | Freq: Once | INTRAMUSCULAR | Status: AC
  Administered 2023-09-24: 500 mg via INTRAMUSCULAR

## 2023-09-24 NOTE — Patient Instructions (Signed)

## 2023-09-24 NOTE — Progress Notes (Signed)
 I,Sheila Reynolds, CMA,acting as a Neurosurgeon for Sheila LOISE Slocumb, MD.,have documented all relevant documentation on the behalf of Sheila LOISE Slocumb, MD,as directed by  Sheila LOISE Slocumb, MD while in the presence of Sheila LOISE Slocumb, MD.  Subjective:  Patient ID: Sheila Reynolds , female    DOB: 01-May-1945 , 78 y.o.   MRN: [id number]  Chief Complaint  Patient presents with   Urinary Tract Infection    Patient presents today for UTI like symptoms. She experiences cloudy urine, stomach pain & burning during urination.     HPI Discussed the use of AI scribe software for clinical note transcription with the patient, who gave verbal consent to proceed.  History of Present Illness [name] is a 78 year old female who presents with bladder pain and a persistent cough.  She is accompanied by her husband.  She has been experiencing bladder pain that radiates up her side along with a burning sensation for over a week.  She has had a persistent cough for four months following a hospitalization for pneumonia. The cough is productive of phlegm and can become severe enough to cause choking. She has informed both her lung and heart specialists about this issue. She was previously treated with doxycycline  and Augmentin  for pneumonia.  She has a history of using an inhaler, which she currently needs a refill for, and she previously had a nebulizer, which she gave to her grandson who has asthma.  No fever or other respiratory symptoms.   HPI   Past Medical History:  Diagnosis Date   Anemia    3 months ago anemic   Anxiety    on meds   Arthritis    all over (01/15/2017)   Asthma    Bronchitis with emphysema    Chest pain    Chronic bronchitis (HCC)    Coronary artery disease    a. 01/2017 she underwent orbital atherectomy/DES to the proxmal LAD and PTCA to ostial D2. 2D Echo 01/15/17 showed mild LVH, EF 60-65%, grade 1 DD.   Family history of anesthesia complication    daughter  N/V   Fibromyalgia    GERD (gastroesophageal reflux disease)    on meds   Glaucoma    Heart murmur    History of hiatal hernia    Hx of echocardiogram    Echo (03/2013):  Tech limited; Mild focal basal septal hypertrophy, EF 60-65%, normal RVF   Hyperlipidemia    Hypertension    Nausea vomiting and diarrhea 08/24/2023   Nonsustained ventricular tachycardia (HCC)    OSA on CPAP    Premature atrial contractions    PVC (premature ventricular contraction)    a. Holter 12/16: NSR, occ PAC,PVCs   Sarcoidosis    Type II diabetes mellitus (HCC)      Family History  Problem Relation Age of Onset   Heart disease Father    Diabetes Father    Glaucoma Father    Cancer Mother        unknown type, ?lung   Heart disease Paternal Grandmother    Cancer Paternal Grandmother        unknown type   Diabetes Brother    Glaucoma Brother      Current Outpatient Medications:    acetaminophen  (TYLENOL ) 500 MG tablet, Take 1,000 mg by mouth 2 (two) times daily as needed for moderate pain or headache., Disp: , Rfl:    albuterol  (PROVENTIL ) (2.5 MG/3ML) 0.083% nebulizer solution, Take 3 mLs (  2.5 mg total) by nebulization daily as needed for wheezing or shortness of breath., Disp: 75 mL, Rfl: 12   albuterol  (VENTOLIN  HFA) 108 (90 Base) MCG/ACT inhaler, Inhale 2 puffs into the lungs every 6 (six) hours as needed for wheezing or shortness of breath., Disp: 18 g, Rfl: 12   amoxicillin -clavulanate (AUGMENTIN ) 875-125 MG tablet, Take 1 tablet by mouth 2 (two) times daily., Disp: 20 tablet, Rfl: 0   aspirin  EC 81 MG tablet, Take 81 mg by mouth daily. Swallow whole., Disp: , Rfl:    atorvastatin  (LIPITOR) 80 MG tablet, Take 1 tablet (80 mg total) by mouth daily., Disp: 90 tablet, Rfl: 3   Azathioprine  75 MG TABS, Take 1 tablet by mouth daily., Disp: , Rfl:    benzonatate  (TESSALON  PERLES) 100 MG capsule, Take 1-2 tablet as needed by mouth three times per day for cough., Disp: 30 capsule, Rfl: 1   Blood Glucose  Monitoring Suppl (ACCU-CHEK AVIVA PLUS) w/Device KIT, Use to check blood sugars 3 times a day. Dx code e11.65, Disp: 1 kit, Rfl: 3   Blood Glucose Monitoring Suppl (ACCU-CHEK GUIDE) w/Device KIT, USE AS NEEDED TO CHECK BLOOD SUGARS., Disp: 1 kit, Rfl: 0   brimonidine  (ALPHAGAN  P) 0.1 % SOLN, Place 1 drop into both eyes in the morning, at noon, and at bedtime., Disp: , Rfl:    brinzolamide  (AZOPT ) 1 % ophthalmic suspension, Place 1 drop into both eyes 3 (three) times daily., Disp: , Rfl:    carvedilol  (COREG ) 12.5 MG tablet, Take 1 tablet (12.5 mg total) by mouth 2 (two) times daily with a meal., Disp: 180 tablet, Rfl: 3   EPINEPHrine  0.3 mg/0.3 mL IJ SOAJ injection, Inject 0.3 mg into the muscle as needed for anaphylaxis., Disp: 1 each, Rfl: 0   ezetimibe  (ZETIA ) 10 MG tablet, Take 1 tablet (10 mg total) by mouth daily., Disp: 90 tablet, Rfl: 1   glucose blood (ACCU-CHEK GUIDE TEST) test strip, Use as instructed to check blood sugars E11.9, Disp: 200 each, Rfl: 1   guaiFENesin  (MUCINEX ) 600 MG 12 hr tablet, Take 1 tablet (600 mg total) by mouth 2 (two) times daily., Disp: 60 tablet, Rfl: 1   isosorbide  mononitrate (IMDUR ) 60 MG 24 hr tablet, TAKE 1 TABLET(60 MG) BY MOUTH DAILY, Disp: 90 tablet, Rfl: 3   ketorolac  (ACULAR ) 0.5 % ophthalmic solution, Place 1 drop into the right eye 2 (two) times daily., Disp: , Rfl:    Lancets (ONETOUCH DELICA PLUS LANCET33G) MISC, CHECK BLOOD SUGAR BEFORE BREAKFAST AND DINNER, Disp: 100 each, Rfl: 1   montelukast  (SINGULAIR ) 10 MG tablet, Take 1 tablet (10 mg total) by mouth daily., Disp: 90 tablet, Rfl: 1   nitroGLYCERIN  (NITROSTAT ) 0.4 MG SL tablet, Place 1 tablet (0.4 mg total) under the tongue every 5 (five) minutes as needed for chest pain., Disp: 25 tablet, Rfl: prn   pantoprazole  (PROTONIX ) 40 MG tablet, Take 1 tablet (40 mg total) by mouth daily., Disp: 90 tablet, Rfl: 3   Polyvinyl Alcohol -Povidone PF (REFRESH) 1.4-0.6 % SOLN, Place 1-2 drops into both eyes 3  (three) times daily as needed (dry/irritated eyes.)., Disp: , Rfl:    prednisoLONE  acetate (PRED FORTE ) 1 % ophthalmic suspension, Place 1 drop into both eyes in the morning, at noon, and at bedtime. Place 2 Drops in Left Eye, Place 3 Drops In Right Eye TID, Disp: , Rfl:    pregabalin  (LYRICA ) 75 MG capsule, TAKE 1 CAPSULE(75 MG) BY MOUTH TWICE DAILY, Disp: 180 capsule, Rfl:  1   JANUMET  50-500 MG tablet, TAKE 1 TABLET BY MOUTH TWICE DAILY WITH A MEAL, Disp: 90 tablet, Rfl: 1   Allergies  Allergen Reactions   Crestor [Rosuvastatin Calcium ] Other (See Comments)    muscle aches   Demerol  [Meperidine Hcl]     Other reaction(s): Hallucinations   Shellfish Allergy Itching and Other (See Comments)    Crab, shrimp and lobster ---lips itch and tingle Was told not to eat again after having a allergy test. Lobster, crab and shrimp      Review of Systems  Constitutional:  Positive for fatigue.  Respiratory:  Positive for cough.   Cardiovascular: Negative.   Genitourinary:  Positive for frequency.  Neurological: Negative.   Psychiatric/Behavioral: Negative.       Today's Vitals   09/24/23 0838  BP: 130/80  Pulse: 85  Temp: 97.8 F (36.6 C)  SpO2: 98%  Weight: 163 lb (73.9 kg)  Height: 5' 2 (1.575 m)   Body mass index is 29.81 kg/m.  Wt Readings from Last 3 Encounters:  09/24/23 163 lb (73.9 kg)  09/10/23 163 lb (73.9 kg)  08/25/23 163 lb (73.9 kg)     Objective:  Physical Exam Vitals and nursing note reviewed.  Constitutional:      Appearance: Normal appearance.  HENT:     Head: Normocephalic and atraumatic.  Eyes:     Extraocular Movements: Extraocular movements intact.  Cardiovascular:     Rate and Rhythm: Normal rate and regular rhythm.     Heart sounds: Normal heart sounds.  Pulmonary:     Effort: Pulmonary effort is normal.     Breath sounds: Rhonchi present.  Abdominal:     General: Bowel sounds are normal.     Palpations: Abdomen is soft.  Musculoskeletal:      Cervical back: Normal range of motion.  Skin:    General: Skin is warm.  Neurological:     General: No focal deficit present.     Mental Status: She is alert.  Psychiatric:        Mood and Affect: Mood normal.        Behavior: Behavior normal.         Assessment And Plan:  Acute cystitis with hematuria Assessment & Plan: Urinalysis confirmed infection. - Administer Rocephin  injection. - Send urine for culture. - Prescribe Augmentin  post-Rocephin .  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  - Advise cessation of dairy for one week. - Administer Rocephin  injection. - Prescribe Augmentin  post-Rocephin . - Refill inhaler prescription. - Discuss need for new nebulizer.   Other orders -     Amoxicillin -Pot Clavulanate; Take 1 tablet by mouth 2 (two) times daily.  Dispense: 20 tablet; Refill: 0   Return if symptoms worsen or fail to improve.  Patient was given opportunity to ask questions. Patient verbalized understanding of the plan and was able to repeat key elements of the plan. All questions were answered to their satisfaction.    I, Sheila LOISE Slocumb, MD, have reviewed all documentation for this visit. The documentation on 09/24/23 for the exam, diagnosis, procedures, and orders are all accurate and complete.   IF YOU HAVE BEEN REFERRED TO A SPECIALIST, IT MAY TAKE 1-2  WEEKS TO SCHEDULE/PROCESS THE REFERRAL. IF YOU HAVE NOT HEARD FROM US /SPECIALIST IN TWO WEEKS, PLEASE GIVE US  A CALL AT [phone] X [phone].   THE PATIENT IS ENCOURAGED TO PRACTICE SOCIAL DISTANCING DUE TO THE COVID-19 PANDEMIC.

## 2023-09-25 ENCOUNTER — Ambulatory Visit: Payer: Self-pay | Admitting: Internal Medicine

## 2023-09-25 ENCOUNTER — Telehealth: Payer: Self-pay

## 2023-09-25 DIAGNOSIS — N3000 Acute cystitis without hematuria: Secondary | ICD-10-CM | POA: Diagnosis not present

## 2023-09-25 DIAGNOSIS — I10 Essential (primary) hypertension: Secondary | ICD-10-CM | POA: Diagnosis not present

## 2023-09-25 DIAGNOSIS — J189 Pneumonia, unspecified organism: Secondary | ICD-10-CM | POA: Diagnosis not present

## 2023-09-25 DIAGNOSIS — J45909 Unspecified asthma, uncomplicated: Secondary | ICD-10-CM | POA: Diagnosis not present

## 2023-09-25 DIAGNOSIS — E119 Type 2 diabetes mellitus without complications: Secondary | ICD-10-CM | POA: Diagnosis not present

## 2023-09-25 DIAGNOSIS — I2510 Atherosclerotic heart disease of native coronary artery without angina pectoris: Secondary | ICD-10-CM | POA: Diagnosis not present

## 2023-09-26 LAB — URINE CULTURE

## 2023-09-27 DIAGNOSIS — N3001 Acute cystitis with hematuria: Secondary | ICD-10-CM | POA: Insufficient documentation

## 2023-09-27 NOTE — Assessment & Plan Note (Signed)
 Urinalysis confirmed infection. - Administer Rocephin  injection. - Send urine for culture. - Prescribe Augmentin  post-Rocephin .

## 2023-09-27 NOTE — Assessment & Plan Note (Deleted)
 Chronic cough with sputum production for four months, possibly exacerbated by dairy.also needs refill of inhaler, new nebulizer.  - Advise cessation of dairy for one week. - Administer Rocephin  injection. - Prescribe Augmentin  post-Rocephin . - Refill inhaler prescription. - Discuss need for new nebulizer.

## 2023-09-27 NOTE — Assessment & Plan Note (Signed)
 Chronic cough with sputum production for four months, possibly exacerbated by dairy.also needs refill of inhaler, new nebulizer.  - Advise cessation of dairy for one week. - Administer Rocephin  injection. - Prescribe Augmentin  post-Rocephin . - Refill inhaler prescription. - Discuss need for new nebulizer.

## 2023-09-28 ENCOUNTER — Other Ambulatory Visit: Payer: Self-pay

## 2023-09-28 MED ORDER — EZETIMIBE 10 MG PO TABS
10.0000 mg | ORAL_TABLET | Freq: Every day | ORAL | 1 refills | Status: AC
Start: 2023-09-28 — End: ?

## 2023-09-30 ENCOUNTER — Ambulatory Visit: Payer: Self-pay | Admitting: Family Medicine

## 2023-09-30 DIAGNOSIS — N3000 Acute cystitis without hematuria: Secondary | ICD-10-CM | POA: Diagnosis not present

## 2023-09-30 DIAGNOSIS — E119 Type 2 diabetes mellitus without complications: Secondary | ICD-10-CM | POA: Diagnosis not present

## 2023-09-30 DIAGNOSIS — J189 Pneumonia, unspecified organism: Secondary | ICD-10-CM | POA: Diagnosis not present

## 2023-09-30 DIAGNOSIS — J45909 Unspecified asthma, uncomplicated: Secondary | ICD-10-CM | POA: Diagnosis not present

## 2023-09-30 DIAGNOSIS — I10 Essential (primary) hypertension: Secondary | ICD-10-CM | POA: Diagnosis not present

## 2023-09-30 DIAGNOSIS — I2510 Atherosclerotic heart disease of native coronary artery without angina pectoris: Secondary | ICD-10-CM | POA: Diagnosis not present

## 2023-09-30 NOTE — Progress Notes (Signed)
 No acute findings in xray

## 2023-10-01 DIAGNOSIS — I2510 Atherosclerotic heart disease of native coronary artery without angina pectoris: Secondary | ICD-10-CM | POA: Diagnosis not present

## 2023-10-01 DIAGNOSIS — J45909 Unspecified asthma, uncomplicated: Secondary | ICD-10-CM | POA: Diagnosis not present

## 2023-10-01 DIAGNOSIS — I10 Essential (primary) hypertension: Secondary | ICD-10-CM | POA: Diagnosis not present

## 2023-10-01 DIAGNOSIS — E119 Type 2 diabetes mellitus without complications: Secondary | ICD-10-CM | POA: Diagnosis not present

## 2023-10-01 DIAGNOSIS — N3000 Acute cystitis without hematuria: Secondary | ICD-10-CM | POA: Diagnosis not present

## 2023-10-01 DIAGNOSIS — J189 Pneumonia, unspecified organism: Secondary | ICD-10-CM | POA: Diagnosis not present

## 2023-10-07 DIAGNOSIS — I2510 Atherosclerotic heart disease of native coronary artery without angina pectoris: Secondary | ICD-10-CM | POA: Diagnosis not present

## 2023-10-07 DIAGNOSIS — J45909 Unspecified asthma, uncomplicated: Secondary | ICD-10-CM | POA: Diagnosis not present

## 2023-10-07 DIAGNOSIS — I10 Essential (primary) hypertension: Secondary | ICD-10-CM | POA: Diagnosis not present

## 2023-10-07 DIAGNOSIS — E119 Type 2 diabetes mellitus without complications: Secondary | ICD-10-CM | POA: Diagnosis not present

## 2023-10-07 DIAGNOSIS — J189 Pneumonia, unspecified organism: Secondary | ICD-10-CM | POA: Diagnosis not present

## 2023-10-07 DIAGNOSIS — N3000 Acute cystitis without hematuria: Secondary | ICD-10-CM | POA: Diagnosis not present

## 2023-10-08 DIAGNOSIS — E119 Type 2 diabetes mellitus without complications: Secondary | ICD-10-CM | POA: Diagnosis not present

## 2023-10-08 DIAGNOSIS — J45909 Unspecified asthma, uncomplicated: Secondary | ICD-10-CM | POA: Diagnosis not present

## 2023-10-08 DIAGNOSIS — I10 Essential (primary) hypertension: Secondary | ICD-10-CM | POA: Diagnosis not present

## 2023-10-08 DIAGNOSIS — N3000 Acute cystitis without hematuria: Secondary | ICD-10-CM | POA: Diagnosis not present

## 2023-10-08 DIAGNOSIS — J189 Pneumonia, unspecified organism: Secondary | ICD-10-CM | POA: Diagnosis not present

## 2023-10-08 DIAGNOSIS — I2510 Atherosclerotic heart disease of native coronary artery without angina pectoris: Secondary | ICD-10-CM | POA: Diagnosis not present

## 2023-10-09 DIAGNOSIS — I2510 Atherosclerotic heart disease of native coronary artery without angina pectoris: Secondary | ICD-10-CM | POA: Diagnosis not present

## 2023-10-09 DIAGNOSIS — N3000 Acute cystitis without hematuria: Secondary | ICD-10-CM | POA: Diagnosis not present

## 2023-10-09 DIAGNOSIS — E119 Type 2 diabetes mellitus without complications: Secondary | ICD-10-CM | POA: Diagnosis not present

## 2023-10-09 DIAGNOSIS — I10 Essential (primary) hypertension: Secondary | ICD-10-CM | POA: Diagnosis not present

## 2023-10-09 DIAGNOSIS — J45909 Unspecified asthma, uncomplicated: Secondary | ICD-10-CM | POA: Diagnosis not present

## 2023-10-09 DIAGNOSIS — J189 Pneumonia, unspecified organism: Secondary | ICD-10-CM | POA: Diagnosis not present

## 2023-10-14 DIAGNOSIS — J189 Pneumonia, unspecified organism: Secondary | ICD-10-CM | POA: Diagnosis not present

## 2023-10-14 DIAGNOSIS — J45909 Unspecified asthma, uncomplicated: Secondary | ICD-10-CM | POA: Diagnosis not present

## 2023-10-14 DIAGNOSIS — N3000 Acute cystitis without hematuria: Secondary | ICD-10-CM | POA: Diagnosis not present

## 2023-10-14 DIAGNOSIS — I2510 Atherosclerotic heart disease of native coronary artery without angina pectoris: Secondary | ICD-10-CM | POA: Diagnosis not present

## 2023-10-14 DIAGNOSIS — I10 Essential (primary) hypertension: Secondary | ICD-10-CM | POA: Diagnosis not present

## 2023-10-14 DIAGNOSIS — E119 Type 2 diabetes mellitus without complications: Secondary | ICD-10-CM | POA: Diagnosis not present

## 2023-10-15 ENCOUNTER — Ambulatory Visit: Payer: Self-pay

## 2023-10-15 DIAGNOSIS — J45909 Unspecified asthma, uncomplicated: Secondary | ICD-10-CM | POA: Diagnosis not present

## 2023-10-15 DIAGNOSIS — J189 Pneumonia, unspecified organism: Secondary | ICD-10-CM | POA: Diagnosis not present

## 2023-10-15 DIAGNOSIS — I2510 Atherosclerotic heart disease of native coronary artery without angina pectoris: Secondary | ICD-10-CM | POA: Diagnosis not present

## 2023-10-15 DIAGNOSIS — I10 Essential (primary) hypertension: Secondary | ICD-10-CM | POA: Diagnosis not present

## 2023-10-15 DIAGNOSIS — E119 Type 2 diabetes mellitus without complications: Secondary | ICD-10-CM | POA: Diagnosis not present

## 2023-10-15 DIAGNOSIS — N3000 Acute cystitis without hematuria: Secondary | ICD-10-CM | POA: Diagnosis not present

## 2023-10-15 NOTE — Telephone Encounter (Signed)
 FYI Only or Action Required?: Action required by provider: Refusing 911, needs call back with further recommendations, attempted to alert [facility] of ED refusal.  Patient was last seen in primary care on 09/24/2023 by [name], MD.  Called Nurse Triage reporting Headache, chest/breast/arm pain, severe pain, and Shortness of Breath.  Symptoms began a week ago.  Interventions attempted: OTC medications: tylenol , baby aspirin .  Symptoms are: sporadic and severe.  Triage Disposition: Call EMS 911 Now  Patient/caregiver understands and will follow disposition?: No, refuses disposition      Copied from CRM #[id number]. Topic: Clinical - Red Word Triage >> Oct 15, 2023  3:16 PM Debby BROCKS wrote: [facility] that prompted transfer to Nurse Triage:  [facility] called to state the patient at 1pm was experiencing head and left breast pain. 8/10 on the pain scale according them. Patient was not with the caller as it was just an information call from home health Call back for home health: [phone] Reason for Disposition  [1] Chest pain lasts > 5 minutes AND [2] age > 58  Answer Assessment - Initial Assessment Questions Don't see any point going to ED for this  When I hurt it hurts bad especially up near my arm and breast and my head, head hurts bad  Hx strokes minor strokes    Advised 911, pt refusing, advised 911/ED if any worsening, sending message to PCP office for call back with further recommendations. Attempted to alert [facility] of ED refusal.  Answer Assessment - Initial Assessment Questions 2. ONSET: When did the headache start? (e.g., minutes, hours, days)      Over a week now with chest/breast pain, headache for over a week too been taking  3. PATTERN: Does the pain come and go, or has it been constant since it started?     Eases off when take tylenol  for head and chest, take 81 mg aspirin  when having head pain 4. SEVERITY: How bad is the pain? and What does it keep you  from doing?  (e.g., Scale 1-10; mild, moderate, or severe)     7-8/10 to head and breast 9. OTHER SYMPTOMS: Do you have any other symptoms? (e.g., fever, stiff neck, eye pain, sore throat, cold symptoms)     Chest pain and SOB when going up and down the stairs and moving around Don't know if chest pain or breast pain, feels like it's in both SOB depends on what I do, activities are limited Speaking in full sentences  No numbness/tingling Lasts longer than 5 min definitely yes Not having right now Used to have this pain late in afternoons and at night, not a constant thing that's 24 hours, once take tylenol  it eases off BP fine 130/70 this morning No dizziness No eye pain or change to vision, vision messed up because glaucoma wouldn't know if change  Protocols used: Headache-A-AH, Chest Pain-A-AH

## 2023-10-16 DIAGNOSIS — N3000 Acute cystitis without hematuria: Secondary | ICD-10-CM | POA: Diagnosis not present

## 2023-10-16 DIAGNOSIS — J45909 Unspecified asthma, uncomplicated: Secondary | ICD-10-CM | POA: Diagnosis not present

## 2023-10-16 DIAGNOSIS — J189 Pneumonia, unspecified organism: Secondary | ICD-10-CM | POA: Diagnosis not present

## 2023-10-16 DIAGNOSIS — I10 Essential (primary) hypertension: Secondary | ICD-10-CM | POA: Diagnosis not present

## 2023-10-16 DIAGNOSIS — E119 Type 2 diabetes mellitus without complications: Secondary | ICD-10-CM | POA: Diagnosis not present

## 2023-10-16 DIAGNOSIS — I2510 Atherosclerotic heart disease of native coronary artery without angina pectoris: Secondary | ICD-10-CM | POA: Diagnosis not present

## 2023-10-17 DIAGNOSIS — J8410 Pulmonary fibrosis, unspecified: Secondary | ICD-10-CM | POA: Diagnosis not present

## 2023-10-17 DIAGNOSIS — Z7984 Long term (current) use of oral hypoglycemic drugs: Secondary | ICD-10-CM | POA: Diagnosis not present

## 2023-10-17 DIAGNOSIS — Z8673 Personal history of transient ischemic attack (TIA), and cerebral infarction without residual deficits: Secondary | ICD-10-CM | POA: Diagnosis not present

## 2023-10-17 DIAGNOSIS — I10 Essential (primary) hypertension: Secondary | ICD-10-CM | POA: Diagnosis not present

## 2023-10-17 DIAGNOSIS — E119 Type 2 diabetes mellitus without complications: Secondary | ICD-10-CM | POA: Diagnosis not present

## 2023-10-17 DIAGNOSIS — Z7982 Long term (current) use of aspirin: Secondary | ICD-10-CM | POA: Diagnosis not present

## 2023-10-17 DIAGNOSIS — K219 Gastro-esophageal reflux disease without esophagitis: Secondary | ICD-10-CM | POA: Diagnosis not present

## 2023-10-17 DIAGNOSIS — J45909 Unspecified asthma, uncomplicated: Secondary | ICD-10-CM | POA: Diagnosis not present

## 2023-10-17 DIAGNOSIS — J189 Pneumonia, unspecified organism: Secondary | ICD-10-CM | POA: Diagnosis not present

## 2023-10-17 DIAGNOSIS — M797 Fibromyalgia: Secondary | ICD-10-CM | POA: Diagnosis not present

## 2023-10-17 DIAGNOSIS — G4733 Obstructive sleep apnea (adult) (pediatric): Secondary | ICD-10-CM | POA: Diagnosis not present

## 2023-10-17 DIAGNOSIS — Z9181 History of falling: Secondary | ICD-10-CM | POA: Diagnosis not present

## 2023-10-17 DIAGNOSIS — N3000 Acute cystitis without hematuria: Secondary | ICD-10-CM | POA: Diagnosis not present

## 2023-10-17 DIAGNOSIS — R55 Syncope and collapse: Secondary | ICD-10-CM | POA: Diagnosis not present

## 2023-10-17 DIAGNOSIS — I2510 Atherosclerotic heart disease of native coronary artery without angina pectoris: Secondary | ICD-10-CM | POA: Diagnosis not present

## 2023-10-17 DIAGNOSIS — E785 Hyperlipidemia, unspecified: Secondary | ICD-10-CM | POA: Diagnosis not present

## 2023-10-17 DIAGNOSIS — M6281 Muscle weakness (generalized): Secondary | ICD-10-CM | POA: Diagnosis not present

## 2023-10-19 DIAGNOSIS — N3000 Acute cystitis without hematuria: Secondary | ICD-10-CM | POA: Diagnosis not present

## 2023-10-19 DIAGNOSIS — E119 Type 2 diabetes mellitus without complications: Secondary | ICD-10-CM | POA: Diagnosis not present

## 2023-10-19 DIAGNOSIS — I10 Essential (primary) hypertension: Secondary | ICD-10-CM | POA: Diagnosis not present

## 2023-10-19 DIAGNOSIS — J8410 Pulmonary fibrosis, unspecified: Secondary | ICD-10-CM | POA: Diagnosis not present

## 2023-10-19 DIAGNOSIS — J189 Pneumonia, unspecified organism: Secondary | ICD-10-CM | POA: Diagnosis not present

## 2023-10-19 DIAGNOSIS — J45909 Unspecified asthma, uncomplicated: Secondary | ICD-10-CM | POA: Diagnosis not present

## 2023-10-21 DIAGNOSIS — J189 Pneumonia, unspecified organism: Secondary | ICD-10-CM | POA: Diagnosis not present

## 2023-10-21 DIAGNOSIS — E119 Type 2 diabetes mellitus without complications: Secondary | ICD-10-CM | POA: Diagnosis not present

## 2023-10-21 DIAGNOSIS — N3000 Acute cystitis without hematuria: Secondary | ICD-10-CM | POA: Diagnosis not present

## 2023-10-21 DIAGNOSIS — I10 Essential (primary) hypertension: Secondary | ICD-10-CM | POA: Diagnosis not present

## 2023-10-21 DIAGNOSIS — J8410 Pulmonary fibrosis, unspecified: Secondary | ICD-10-CM | POA: Diagnosis not present

## 2023-10-21 DIAGNOSIS — J45909 Unspecified asthma, uncomplicated: Secondary | ICD-10-CM | POA: Diagnosis not present

## 2023-10-25 ENCOUNTER — Other Ambulatory Visit: Payer: Self-pay | Admitting: Internal Medicine

## 2023-10-26 DIAGNOSIS — H2013 Chronic iridocyclitis, bilateral: Secondary | ICD-10-CM | POA: Diagnosis not present

## 2023-10-26 DIAGNOSIS — H401122 Primary open-angle glaucoma, left eye, moderate stage: Secondary | ICD-10-CM | POA: Diagnosis not present

## 2023-10-26 DIAGNOSIS — E119 Type 2 diabetes mellitus without complications: Secondary | ICD-10-CM | POA: Diagnosis not present

## 2023-10-26 DIAGNOSIS — H401113 Primary open-angle glaucoma, right eye, severe stage: Secondary | ICD-10-CM | POA: Diagnosis not present

## 2023-10-29 DIAGNOSIS — J45909 Unspecified asthma, uncomplicated: Secondary | ICD-10-CM | POA: Diagnosis not present

## 2023-10-29 DIAGNOSIS — E119 Type 2 diabetes mellitus without complications: Secondary | ICD-10-CM | POA: Diagnosis not present

## 2023-10-29 DIAGNOSIS — J8410 Pulmonary fibrosis, unspecified: Secondary | ICD-10-CM | POA: Diagnosis not present

## 2023-10-29 DIAGNOSIS — N3000 Acute cystitis without hematuria: Secondary | ICD-10-CM | POA: Diagnosis not present

## 2023-10-29 DIAGNOSIS — J189 Pneumonia, unspecified organism: Secondary | ICD-10-CM | POA: Diagnosis not present

## 2023-10-29 DIAGNOSIS — I10 Essential (primary) hypertension: Secondary | ICD-10-CM | POA: Diagnosis not present

## 2023-10-30 ENCOUNTER — Ambulatory Visit (HOSPITAL_BASED_OUTPATIENT_CLINIC_OR_DEPARTMENT_OTHER)
Admission: RE | Admit: 2023-10-30 | Discharge: 2023-10-30 | Disposition: A | Discharge status: Home / Self Care | Source: Ambulatory Visit | Attending: Internal Medicine | Admitting: Internal Medicine

## 2023-10-30 DIAGNOSIS — G4733 Obstructive sleep apnea (adult) (pediatric): Secondary | ICD-10-CM | POA: Insufficient documentation

## 2023-10-30 DIAGNOSIS — D869 Sarcoidosis, unspecified: Secondary | ICD-10-CM | POA: Diagnosis not present

## 2023-10-30 DIAGNOSIS — J019 Acute sinusitis, unspecified: Secondary | ICD-10-CM | POA: Diagnosis not present

## 2023-10-30 DIAGNOSIS — D860 Sarcoidosis of lung: Secondary | ICD-10-CM | POA: Diagnosis not present

## 2023-10-30 DIAGNOSIS — J849 Interstitial pulmonary disease, unspecified: Secondary | ICD-10-CM | POA: Insufficient documentation

## 2023-10-30 DIAGNOSIS — R918 Other nonspecific abnormal finding of lung field: Secondary | ICD-10-CM | POA: Insufficient documentation

## 2023-10-30 DIAGNOSIS — J432 Centrilobular emphysema: Secondary | ICD-10-CM | POA: Diagnosis not present

## 2023-11-12 DIAGNOSIS — J45909 Unspecified asthma, uncomplicated: Secondary | ICD-10-CM | POA: Diagnosis not present

## 2023-11-12 DIAGNOSIS — N3000 Acute cystitis without hematuria: Secondary | ICD-10-CM | POA: Diagnosis not present

## 2023-11-12 DIAGNOSIS — I10 Essential (primary) hypertension: Secondary | ICD-10-CM | POA: Diagnosis not present

## 2023-11-12 DIAGNOSIS — E119 Type 2 diabetes mellitus without complications: Secondary | ICD-10-CM | POA: Diagnosis not present

## 2023-11-12 DIAGNOSIS — J8410 Pulmonary fibrosis, unspecified: Secondary | ICD-10-CM | POA: Diagnosis not present

## 2023-11-12 DIAGNOSIS — J189 Pneumonia, unspecified organism: Secondary | ICD-10-CM | POA: Diagnosis not present

## 2023-11-16 DIAGNOSIS — J8410 Pulmonary fibrosis, unspecified: Secondary | ICD-10-CM | POA: Diagnosis not present

## 2023-11-16 DIAGNOSIS — Z7982 Long term (current) use of aspirin: Secondary | ICD-10-CM | POA: Diagnosis not present

## 2023-11-16 DIAGNOSIS — G4733 Obstructive sleep apnea (adult) (pediatric): Secondary | ICD-10-CM | POA: Diagnosis not present

## 2023-11-16 DIAGNOSIS — Z8673 Personal history of transient ischemic attack (TIA), and cerebral infarction without residual deficits: Secondary | ICD-10-CM | POA: Diagnosis not present

## 2023-11-16 DIAGNOSIS — N3000 Acute cystitis without hematuria: Secondary | ICD-10-CM | POA: Diagnosis not present

## 2023-11-16 DIAGNOSIS — Z9181 History of falling: Secondary | ICD-10-CM | POA: Diagnosis not present

## 2023-11-16 DIAGNOSIS — J45909 Unspecified asthma, uncomplicated: Secondary | ICD-10-CM | POA: Diagnosis not present

## 2023-11-16 DIAGNOSIS — M797 Fibromyalgia: Secondary | ICD-10-CM | POA: Diagnosis not present

## 2023-11-16 DIAGNOSIS — M6281 Muscle weakness (generalized): Secondary | ICD-10-CM | POA: Diagnosis not present

## 2023-11-16 DIAGNOSIS — J189 Pneumonia, unspecified organism: Secondary | ICD-10-CM | POA: Diagnosis not present

## 2023-11-16 DIAGNOSIS — R55 Syncope and collapse: Secondary | ICD-10-CM | POA: Diagnosis not present

## 2023-11-16 DIAGNOSIS — I10 Essential (primary) hypertension: Secondary | ICD-10-CM | POA: Diagnosis not present

## 2023-11-16 DIAGNOSIS — K219 Gastro-esophageal reflux disease without esophagitis: Secondary | ICD-10-CM | POA: Diagnosis not present

## 2023-11-16 DIAGNOSIS — E785 Hyperlipidemia, unspecified: Secondary | ICD-10-CM | POA: Diagnosis not present

## 2023-11-16 DIAGNOSIS — E119 Type 2 diabetes mellitus without complications: Secondary | ICD-10-CM | POA: Diagnosis not present

## 2023-11-16 DIAGNOSIS — I2510 Atherosclerotic heart disease of native coronary artery without angina pectoris: Secondary | ICD-10-CM | POA: Diagnosis not present

## 2023-11-16 DIAGNOSIS — Z7984 Long term (current) use of oral hypoglycemic drugs: Secondary | ICD-10-CM | POA: Diagnosis not present

## 2023-11-18 DIAGNOSIS — I10 Essential (primary) hypertension: Secondary | ICD-10-CM | POA: Diagnosis not present

## 2023-11-18 DIAGNOSIS — J45909 Unspecified asthma, uncomplicated: Secondary | ICD-10-CM | POA: Diagnosis not present

## 2023-11-18 DIAGNOSIS — J8410 Pulmonary fibrosis, unspecified: Secondary | ICD-10-CM | POA: Diagnosis not present

## 2023-11-18 DIAGNOSIS — J189 Pneumonia, unspecified organism: Secondary | ICD-10-CM | POA: Diagnosis not present

## 2023-11-18 DIAGNOSIS — N3000 Acute cystitis without hematuria: Secondary | ICD-10-CM | POA: Diagnosis not present

## 2023-11-18 DIAGNOSIS — E119 Type 2 diabetes mellitus without complications: Secondary | ICD-10-CM | POA: Diagnosis not present

## 2023-11-20 DIAGNOSIS — I70203 Unspecified atherosclerosis of native arteries of extremities, bilateral legs: Secondary | ICD-10-CM | POA: Diagnosis not present

## 2023-11-20 DIAGNOSIS — M19071 Primary osteoarthritis, right ankle and foot: Secondary | ICD-10-CM | POA: Diagnosis not present

## 2023-11-20 DIAGNOSIS — I890 Lymphedema, not elsewhere classified: Secondary | ICD-10-CM | POA: Diagnosis not present

## 2023-11-20 DIAGNOSIS — Z9181 History of falling: Secondary | ICD-10-CM | POA: Diagnosis not present

## 2023-11-20 DIAGNOSIS — L84 Corns and callosities: Secondary | ICD-10-CM | POA: Diagnosis not present

## 2023-11-20 DIAGNOSIS — E1151 Type 2 diabetes mellitus with diabetic peripheral angiopathy without gangrene: Secondary | ICD-10-CM | POA: Diagnosis not present

## 2023-11-20 DIAGNOSIS — R262 Difficulty in walking, not elsewhere classified: Secondary | ICD-10-CM | POA: Diagnosis not present

## 2023-11-20 DIAGNOSIS — M2012 Hallux valgus (acquired), left foot: Secondary | ICD-10-CM | POA: Diagnosis not present

## 2023-11-20 DIAGNOSIS — B351 Tinea unguium: Secondary | ICD-10-CM | POA: Diagnosis not present

## 2023-11-20 DIAGNOSIS — M19072 Primary osteoarthritis, left ankle and foot: Secondary | ICD-10-CM | POA: Diagnosis not present

## 2023-11-20 DIAGNOSIS — M2011 Hallux valgus (acquired), right foot: Secondary | ICD-10-CM | POA: Diagnosis not present

## 2023-11-22 ENCOUNTER — Ambulatory Visit: Payer: Self-pay | Admitting: Internal Medicine

## 2023-11-22 NOTE — Progress Notes (Signed)
 Patient is overdue for a follow-up. Please call and schedule an appointment wITH DR SHELAH  to discuss results of the RUL nodule. DR SHELAH had seen here in Nov 2024 for nodule. Then she saw me in June 2025. I did followup CT for Sept 2025 and office visit but I do NOT seen an appotiment. The nodule is NOW growing. Please give appt iwht [place] first available next few weeks. She can see me in December or Jan 2026

## 2023-11-23 ENCOUNTER — Encounter: Payer: Self-pay | Admitting: Emergency Medicine

## 2023-11-23 DIAGNOSIS — A609 Anogenital herpesviral infection, unspecified: Secondary | ICD-10-CM | POA: Diagnosis not present

## 2023-11-23 DIAGNOSIS — Z01419 Encounter for gynecological examination (general) (routine) without abnormal findings: Secondary | ICD-10-CM | POA: Diagnosis not present

## 2023-11-23 DIAGNOSIS — N951 Menopausal and female climacteric states: Secondary | ICD-10-CM | POA: Diagnosis not present

## 2023-11-23 NOTE — Progress Notes (Signed)
 ATC left VM Sent LTR

## 2023-11-25 DIAGNOSIS — J8410 Pulmonary fibrosis, unspecified: Secondary | ICD-10-CM | POA: Diagnosis not present

## 2023-11-25 DIAGNOSIS — J45909 Unspecified asthma, uncomplicated: Secondary | ICD-10-CM | POA: Diagnosis not present

## 2023-11-25 DIAGNOSIS — E119 Type 2 diabetes mellitus without complications: Secondary | ICD-10-CM | POA: Diagnosis not present

## 2023-11-25 DIAGNOSIS — N3000 Acute cystitis without hematuria: Secondary | ICD-10-CM | POA: Diagnosis not present

## 2023-11-25 DIAGNOSIS — I10 Essential (primary) hypertension: Secondary | ICD-10-CM | POA: Diagnosis not present

## 2023-11-25 DIAGNOSIS — J189 Pneumonia, unspecified organism: Secondary | ICD-10-CM | POA: Diagnosis not present

## 2023-11-26 ENCOUNTER — Encounter: Payer: Self-pay | Admitting: Emergency Medicine

## 2023-11-26 ENCOUNTER — Ambulatory Visit (INDEPENDENT_AMBULATORY_CARE_PROVIDER_SITE_OTHER): Admitting: Emergency Medicine

## 2023-11-26 ENCOUNTER — Telehealth: Payer: Self-pay | Admitting: Emergency Medicine

## 2023-11-26 VITALS — BP 181/99 | HR 78 | Temp 98.6°F | Ht 62.0 in | Wt 161.6 lb

## 2023-11-26 DIAGNOSIS — R918 Other nonspecific abnormal finding of lung field: Secondary | ICD-10-CM

## 2023-11-26 DIAGNOSIS — J849 Interstitial pulmonary disease, unspecified: Secondary | ICD-10-CM

## 2023-11-26 DIAGNOSIS — R911 Solitary pulmonary nodule: Secondary | ICD-10-CM | POA: Insufficient documentation

## 2023-11-26 DIAGNOSIS — G4733 Obstructive sleep apnea (adult) (pediatric): Secondary | ICD-10-CM

## 2023-11-26 NOTE — Progress Notes (Signed)
 Subjective:    Patient ID: Sheila Reynolds, female    DOB: 12-12-1945, 78 y.o.   MRN: [id number]  HPI  ROV 11/26/2023 --[name] is 36 with a history of former tobacco use and COPD, sarcoidosis, OSA on CPAP.  I saw her for an enlarging right upper lobe pulmonary nodule with an area of cavitation on CT scan of the chest first unified when she was hospitalized following syncope and traumatic fall.  She has been seen in the past by Dr. Neysa and also Dr. Geronimo.  Repeat CT scan of her chest 01/2023 showed waxing and waning tree-in-bud clustered nodularity.  The previously noted spiculated right upper lobe nodule was smaller to resolved.  Subsequent imaging has included a CT chest abdomen pelvis 07/2023 and then a high-resolution scan done in September.  She has had 2 more syncopal episodes since I have seen her.   CT scan of the chest 08/09/2023 reviewed by me showed waxing and waning tree-in-bud clustered nodularity with resolution of the previously noted spiculated cavitary nodule but a 6 x 5 mm nodular focus within the inflammatory change.  High-resolution CT scan of the chest 10/30/2023 reviewed by me shows an interval increase in the area of inflammatory tree-in-bud nodularity in the posterior right upper lobe.   Review of Systems As per HPI  Past Medical History:  Diagnosis Date   Anemia    3 months ago anemic   Anxiety    on meds   Arthritis    all over (01/15/2017)   Asthma    Bronchitis with emphysema    Chest pain    Chronic bronchitis (HCC)    Coronary artery disease    a. 01/2017 she underwent orbital atherectomy/DES to the proxmal LAD and PTCA to ostial D2. 2D Echo 01/15/17 showed mild LVH, EF 60-65%, grade 1 DD.   Family history of anesthesia complication    daughter N/V   Fibromyalgia    GERD (gastroesophageal reflux disease)    on meds   Glaucoma    Heart murmur    History of hiatal hernia    Hx of echocardiogram    Echo (03/2013):  Tech limited; Mild focal  basal septal hypertrophy, EF 60-65%, normal RVF   Hyperlipidemia    Hypertension    Nausea vomiting and diarrhea 08/24/2023   Nonsustained ventricular tachycardia (HCC)    OSA on CPAP    Premature atrial contractions    PVC (premature ventricular contraction)    a. Holter 12/16: NSR, occ PAC,PVCs   Sarcoidosis    Type II diabetes mellitus (HCC)      Family History  Problem Relation Age of Onset   Heart disease Father    Diabetes Father    Glaucoma Father    Cancer Mother        unknown type, ?lung   Heart disease Paternal Grandmother    Cancer Paternal Grandmother        unknown type   Diabetes Brother    Glaucoma Brother      Social History   Socioeconomic History   Marital status: Married    Spouse name: Medford   Number of children: 2   Years of education: Not on file   Highest education level: Not on file  Occupational History   Occupation: unemployed    Employer: [employer]  Tobacco Use   Smoking status: Former    Current packs/day: 0.00    Average packs/day: 1 pack/day for 4.0 years (4.0 ttl  pk-yrs)    Types: Cigarettes    Start date: 02/11/1976    Quit date: 02/11/1980    Years since quitting: 43.8   Smokeless tobacco: Never  Vaping Use   Vaping status: Never Used  Substance and Sexual Activity   Alcohol  use: No   Drug use: No   Sexual activity: Yes  Other Topics Concern   Not on file  Social History Narrative   Lives with husband   Right hand   Drinks 2-3 cups caffeine daily   Social Drivers of Health   Financial Resource Strain: Low Risk  (06/09/2023)   Overall Financial Resource Strain (CARDIA)    Difficulty of Paying Living Expenses: Not hard at all  Food Insecurity: No Food Insecurity (08/12/2023)   Hunger Vital Sign    Worried About Running Out of Food in the Last Year: Never true    Ran Out of Food in the Last Year: Never true  Transportation Needs: No Transportation Needs (08/12/2023)   PRAPARE - Scientist, research (physical sciences) (Medical): No    Lack of Transportation (Non-Medical): No  Physical Activity: Insufficiently Active (06/09/2023)   Exercise Vital Sign    Days of Exercise per Week: 2 days    Minutes of Exercise per Session: 30 min  Stress: No Stress Concern Present (06/09/2023)   [employer] of Occupational Health - Occupational Stress Questionnaire    Feeling of Stress : Not at all  Recent Concern: Stress - Stress Concern Present (05/06/2023)   [employer] of Occupational Health - Occupational Stress Questionnaire    Feeling of Stress : To some extent  Social Connections: Unknown (08/10/2023)   Social Connection and Isolation Panel    Frequency of Communication with Friends and Family: More than three times a week    Frequency of Social Gatherings with Friends and Family: More than three times a week    Attends Religious Services: Not on file    Active Member of Clubs or Organizations: Yes    Attends Banker Meetings: Patient unable to answer    Marital Status: Not on file  Intimate Partner Violence: Not At Risk (08/12/2023)   Humiliation, Afraid, Rape, and Kick questionnaire    Fear of Current or Ex-Partner: No    Emotionally Abused: No    Physically Abused: No    Sexually Abused: No     Allergies  Allergen Reactions   Crestor [Rosuvastatin Calcium ] Other (See Comments)    muscle aches   Demerol  [Meperidine Hcl]     Other reaction(s): Hallucinations   Shellfish Allergy Itching and Other (See Comments)    Crab, shrimp and lobster ---lips itch and tingle Was told not to eat again after having a allergy test. Lobster, crab and shrimp      Outpatient Medications Prior to Visit  Medication Sig Dispense Refill   acetaminophen  (TYLENOL ) 500 MG tablet Take 1,000 mg by mouth 2 (two) times daily as needed for moderate pain or headache.     albuterol  (PROVENTIL ) (2.5 MG/3ML) 0.083% nebulizer solution Take 3 mLs (2.5 mg total) by nebulization daily as needed for  wheezing or shortness of breath. 75 mL 12   aspirin  EC 81 MG tablet Take 81 mg by mouth daily. Swallow whole.     atorvastatin  (LIPITOR) 80 MG tablet Take 1 tablet (80 mg total) by mouth daily. 90 tablet 3   Azathioprine  75 MG TABS Take 1 tablet by mouth daily.     benzonatate  (TESSALON   PERLES) 100 MG capsule Take 1-2 tablet as needed by mouth three times per day for cough. 30 capsule 1   Blood Glucose Monitoring Suppl (ACCU-CHEK AVIVA PLUS) w/Device KIT Use to check blood sugars 3 times a day. Dx code e11.65 1 kit 3   Blood Glucose Monitoring Suppl (ACCU-CHEK GUIDE) w/Device KIT USE AS NEEDED TO CHECK BLOOD SUGARS. 1 kit 0   brimonidine  (ALPHAGAN  P) 0.1 % SOLN Place 1 drop into both eyes in the morning, at noon, and at bedtime.     brinzolamide  (AZOPT ) 1 % ophthalmic suspension Place 1 drop into both eyes 3 (three) times daily.     carvedilol  (COREG ) 12.5 MG tablet Take 1 tablet (12.5 mg total) by mouth 2 (two) times daily with a meal. 180 tablet 3   EPINEPHrine  0.3 mg/0.3 mL IJ SOAJ injection Inject 0.3 mg into the muscle as needed for anaphylaxis. 1 each 0   ezetimibe  (ZETIA ) 10 MG tablet Take 1 tablet (10 mg total) by mouth daily. 90 tablet 1   glucose blood (ACCU-CHEK GUIDE TEST) test strip Use as instructed to check blood sugars E11.9 200 each 1   guaiFENesin  (MUCINEX ) 600 MG 12 hr tablet Take 1 tablet (600 mg total) by mouth 2 (two) times daily. 60 tablet 1   isosorbide  mononitrate (IMDUR ) 60 MG 24 hr tablet TAKE 1 TABLET(60 MG) BY MOUTH DAILY 90 tablet 3   JANUMET  50-500 MG tablet TAKE 1 TABLET BY MOUTH TWICE DAILY WITH A MEAL 90 tablet 1   ketorolac  (ACULAR ) 0.5 % ophthalmic solution Place 1 drop into the right eye 2 (two) times daily.     Lancets (ONETOUCH DELICA PLUS LANCET33G) MISC CHECK BLOOD SUGAR BEFORE BREAKFAST AND DINNER 100 each 1   montelukast  (SINGULAIR ) 10 MG tablet Take 1 tablet (10 mg total) by mouth daily. 90 tablet 1   nitroGLYCERIN  (NITROSTAT ) 0.4 MG SL tablet Place 1  tablet (0.4 mg total) under the tongue every 5 (five) minutes as needed for chest pain. 25 tablet prn   pantoprazole  (PROTONIX ) 40 MG tablet Take 1 tablet (40 mg total) by mouth daily. 90 tablet 3   Polyvinyl Alcohol -Povidone PF (REFRESH) 1.4-0.6 % SOLN Place 1-2 drops into both eyes 3 (three) times daily as needed (dry/irritated eyes.).     prednisoLONE  acetate (PRED FORTE ) 1 % ophthalmic suspension Place 1 drop into both eyes in the morning, at noon, and at bedtime. Place 2 Drops in Left Eye, Place 3 Drops In Right Eye TID     pregabalin  (LYRICA ) 75 MG capsule TAKE 1 CAPSULE(75 MG) BY MOUTH TWICE DAILY 180 capsule 1   albuterol  (VENTOLIN  HFA) 108 (90 Base) MCG/ACT inhaler Inhale 2 puffs into the lungs every 6 (six) hours as needed for wheezing or shortness of breath. (Patient not taking: Reported on 11/26/2023) 18 g 12   amoxicillin -clavulanate (AUGMENTIN ) 875-125 MG tablet Take 1 tablet by mouth 2 (two) times daily. 20 tablet 0   No facility-administered medications prior to visit.        Objective:   Physical Exam Vitals:   11/26/23 1003  BP: (!) 181/99  Pulse: 78  Temp: 98.6 F (37 C)  TempSrc: Oral  SpO2: 100%  Weight: 161 lb 9.6 oz (73.3 kg)  Height: 5' 2 (1.575 m)   Gen: Pleasant, comfortable elderly woman, in no distress,  normal affect  ENT: No lesions,  mouth clear,  oropharynx clear, no postnasal drip  Neck: No JVD, no stridor  Lungs: No use of accessory  muscles, distant breath sounds, no crackles or wheezes or normal breath.  No wheezing on forced expiration.  Cardiovascular: RRR, heart sounds normal, no murmur or gallops, no peripheral edema  Musculoskeletal: No deformities, no cyanosis or clubbing  Neuro: alert, awake, non focal  Skin: Warm, no lesions or rash      Assessment & Plan:  Multiple lung nodules Multiple pulmonary nodules with a waxing and waning tree-in-bud clustered nodularity in the posterior right upper lobe that had initially improved,  persists and may be a bit more prominent than on most recent priors.  Suspicion for malignancy is lower given the waxing and waning nature of the abnormality.  That said there would be benefit to getting a tissue diagnosis, culture data to rule out malignancy, possibly rule in an inflammatory process like her sarcoid.  Discussed the pros and cons, risks and benefits of bronchoscopy and she has elected to proceed.   We will arrange for bronchoscopy to evaluate an opacity in your right upper lobe.  This will be done as an outpatient at [facility] under general anesthesia.  You will need someone to drive you and also someone to stay with you overnight that evening after the procedure.  We will try to get this scheduled for 12/08/2023. We will perform a repeat CT scan of your chest in preparation for the test. Follow-up in our office to review biopsy results to help guide next steps in treatment and surveillance.  Obstructive sleep apnea Confirmed good compliance with her CPAP, good clinical benefit.  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  She has seen Dr. Geronimo in the past.  Time spent 47 minutes reviewing CT scans of the chest, discussing differential diagnosis, discussing possible options including watchful waiting, bronchoscopy for culture data and tissue diagnosis.  Lamar Chris, MD, PhD 11/26/2023, 5:26 PM [facility] [phone] or if no answer before 7:00PM call [phone] For any issues after 7:00PM please call eLink [phone] The next 1 is hospital follow-up visit in

## 2023-11-26 NOTE — Assessment & Plan Note (Signed)
 Multiple pulmonary nodules with a waxing and waning tree-in-bud clustered nodularity in the posterior right upper lobe that had initially improved, persists and may be a bit more prominent than on most recent priors.  Suspicion for malignancy is lower given the waxing and waning nature of the abnormality.  That said there would be benefit to getting a tissue diagnosis, culture data to rule out malignancy, possibly rule in an inflammatory process like her sarcoid.  Discussed the pros and cons, risks and benefits of bronchoscopy and she has elected to proceed.   We will arrange for bronchoscopy to evaluate an opacity in your right upper lobe.  This will be done as an outpatient at [facility] under general anesthesia.  You will need someone to drive you and also someone to stay with you overnight that evening after the procedure.  We will try to get this scheduled for 12/08/2023. We will perform a repeat CT scan of your chest in preparation for the test. Follow-up in our office to review biopsy results to help guide next steps in treatment and surveillance.

## 2023-11-26 NOTE — Patient Instructions (Signed)
 We will arrange for bronchoscopy to evaluate an opacity in your right upper lobe.  This will be done as an outpatient at [facility] under general anesthesia.  You will need someone to drive you and also someone to stay with you overnight that evening after the procedure.  We will try to get this scheduled for 12/08/2023. We will perform a repeat CT scan of your chest in preparation for the test. Follow-up in our office to review biopsy results to help guide next steps in treatment and surveillance.

## 2023-11-26 NOTE — Telephone Encounter (Signed)
 Please schedule the following:  Provider performing procedure: Sukhdeep Wieting Diagnosis: Right upper lobe nodule Which side if for nodule / mass?  Right Procedure: Robotic assisted navigational bronchoscopy Has patient been spoken to by Provider and given informed consent?  Yes Anesthesia: Normal Do you need Fluro?  Yes Duration of procedure: 30 minutes Date: 12/08/2023 Alternate Date:   Time: Any Location: [facility] Does patient have OSA?  No DM?  Yes or Latex allergy?  No Medication Restriction/ Anticoagulate/Antiplatelet: None Pre-op Labs Ordered:determined by Anesthesia Imaging request: New super D CT just ordered (If, SuperDimension CT Chest, please have STAT courier sent to ENDO)

## 2023-11-26 NOTE — Telephone Encounter (Signed)
 Letter given by the nurse Case# [id number] will send to [facility] to check on auth

## 2023-11-26 NOTE — Assessment & Plan Note (Signed)
 Confirmed good compliance with her CPAP, good clinical benefit.  Plan continue same

## 2023-11-26 NOTE — Assessment & Plan Note (Signed)
 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  She has seen Dr. Geronimo in the past.

## 2023-11-26 NOTE — H&P (View-Only) (Signed)
 Subjective:    Patient ID: Sheila Reynolds, female    DOB: 12-12-1945, 78 y.o.   MRN: [id number]  HPI  ROV 11/26/2023 --Sheila Reynolds is 78 with a history of former tobacco use and COPD, sarcoidosis, OSA on CPAP.  I saw her for an enlarging right upper lobe pulmonary nodule with an area of cavitation on CT scan of the chest first unified when she was hospitalized following syncope and traumatic fall.  She has been seen in the past by Dr. Neysa and also Dr. Geronimo.  Repeat CT scan of her chest 01/2023 showed waxing and waning tree-in-bud clustered nodularity.  The previously noted spiculated right upper lobe nodule was smaller to resolved.  Subsequent imaging has included a CT chest abdomen pelvis 07/2023 and then a high-resolution scan done in September.  She has had 2 more syncopal episodes since I have seen her.   CT scan of the chest 08/09/2023 reviewed by me showed waxing and waning tree-in-bud clustered nodularity with resolution of the previously noted spiculated cavitary nodule but a 6 x 5 mm nodular focus within the inflammatory change.  High-resolution CT scan of the chest 10/30/2023 reviewed by me shows an interval increase in the area of inflammatory tree-in-bud nodularity in the posterior right upper lobe.   Review of Systems As per HPI  Past Medical History:  Diagnosis Date   Anemia    3 months ago anemic   Anxiety    on meds   Arthritis    all over (01/15/2017)   Asthma    Bronchitis with emphysema    Chest pain    Chronic bronchitis (HCC)    Coronary artery disease    a. 01/2017 she underwent orbital atherectomy/DES to the proxmal LAD and PTCA to ostial D2. 2D Echo 01/15/17 showed mild LVH, EF 60-65%, grade 1 DD.   Family history of anesthesia complication    daughter N/V   Fibromyalgia    GERD (gastroesophageal reflux disease)    on meds   Glaucoma    Heart murmur    History of hiatal hernia    Hx of echocardiogram    Echo (03/2013):  Tech limited; Mild focal  basal septal hypertrophy, EF 60-65%, normal RVF   Hyperlipidemia    Hypertension    Nausea vomiting and diarrhea 08/24/2023   Nonsustained ventricular tachycardia (HCC)    OSA on CPAP    Premature atrial contractions    PVC (premature ventricular contraction)    a. Holter 12/16: NSR, occ PAC,PVCs   Sarcoidosis    Type II diabetes mellitus (HCC)      Family History  Problem Relation Age of Onset   Heart disease Father    Diabetes Father    Glaucoma Father    Cancer Mother        unknown type, ?lung   Heart disease Paternal Grandmother    Cancer Paternal Grandmother        unknown type   Diabetes Brother    Glaucoma Brother      Social History   Socioeconomic History   Marital status: Married    Spouse name: Medford   Number of children: 2   Years of education: Not on file   Highest education level: Not on file  Occupational History   Occupation: unemployed    Employer: [employer]  Tobacco Use   Smoking status: Former    Current packs/day: 0.00    Average packs/day: 1 pack/day for 4.0 years (4.0 ttl  pk-yrs)    Types: Cigarettes    Start date: 02/11/1976    Quit date: 02/11/1980    Years since quitting: 43.8   Smokeless tobacco: Never  Vaping Use   Vaping status: Never Used  Substance and Sexual Activity   Alcohol  use: No   Drug use: No   Sexual activity: Yes  Other Topics Concern   Not on file  Social History Narrative   Lives with husband   Right hand   Drinks 2-3 cups caffeine daily   Social Drivers of Health   Financial Resource Strain: Low Risk  (06/09/2023)   Overall Financial Resource Strain (CARDIA)    Difficulty of Paying Living Expenses: Not hard at all  Food Insecurity: No Food Insecurity (08/12/2023)   Hunger Vital Sign    Worried About Running Out of Food in the Last Year: Never true    Ran Out of Food in the Last Year: Never true  Transportation Needs: No Transportation Needs (08/12/2023)   PRAPARE - Scientist, research (physical sciences) (Medical): No    Lack of Transportation (Non-Medical): No  Physical Activity: Insufficiently Active (06/09/2023)   Exercise Vital Sign    Days of Exercise per Week: 2 days    Minutes of Exercise per Session: 30 min  Stress: No Stress Concern Present (06/09/2023)   [employer] of Occupational Health - Occupational Stress Questionnaire    Feeling of Stress : Not at all  Recent Concern: Stress - Stress Concern Present (05/06/2023)   [employer] of Occupational Health - Occupational Stress Questionnaire    Feeling of Stress : To some extent  Social Connections: Unknown (08/10/2023)   Social Connection and Isolation Panel    Frequency of Communication with Friends and Family: More than three times a week    Frequency of Social Gatherings with Friends and Family: More than three times a week    Attends Religious Services: Not on file    Active Member of Clubs or Organizations: Yes    Attends Banker Meetings: Patient unable to answer    Marital Status: Not on file  Intimate Partner Violence: Not At Risk (08/12/2023)   Humiliation, Afraid, Rape, and Kick questionnaire    Fear of Current or Ex-Partner: No    Emotionally Abused: No    Physically Abused: No    Sexually Abused: No     Allergies  Allergen Reactions   Crestor [Rosuvastatin Calcium ] Other (See Comments)    muscle aches   Demerol  [Meperidine Hcl]     Other reaction(s): Hallucinations   Shellfish Allergy Itching and Other (See Comments)    Crab, shrimp and lobster ---lips itch and tingle Was told not to eat again after having a allergy test. Lobster, crab and shrimp      Outpatient Medications Prior to Visit  Medication Sig Dispense Refill   acetaminophen  (TYLENOL ) 500 MG tablet Take 1,000 mg by mouth 2 (two) times daily as needed for moderate pain or headache.     albuterol  (PROVENTIL ) (2.5 MG/3ML) 0.083% nebulizer solution Take 3 mLs (2.5 mg total) by nebulization daily as needed for  wheezing or shortness of breath. 75 mL 12   aspirin  EC 81 MG tablet Take 81 mg by mouth daily. Swallow whole.     atorvastatin  (LIPITOR) 80 MG tablet Take 1 tablet (80 mg total) by mouth daily. 90 tablet 3   Azathioprine  75 MG TABS Take 1 tablet by mouth daily.     benzonatate  (TESSALON   PERLES) 100 MG capsule Take 1-2 tablet as needed by mouth three times per day for cough. 30 capsule 1   Blood Glucose Monitoring Suppl (ACCU-CHEK AVIVA PLUS) w/Device KIT Use to check blood sugars 3 times a day. Dx code e11.65 1 kit 3   Blood Glucose Monitoring Suppl (ACCU-CHEK GUIDE) w/Device KIT USE AS NEEDED TO CHECK BLOOD SUGARS. 1 kit 0   brimonidine  (ALPHAGAN  P) 0.1 % SOLN Place 1 drop into both eyes in the morning, at noon, and at bedtime.     brinzolamide  (AZOPT ) 1 % ophthalmic suspension Place 1 drop into both eyes 3 (three) times daily.     carvedilol  (COREG ) 12.5 MG tablet Take 1 tablet (12.5 mg total) by mouth 2 (two) times daily with a meal. 180 tablet 3   EPINEPHrine  0.3 mg/0.3 mL IJ SOAJ injection Inject 0.3 mg into the muscle as needed for anaphylaxis. 1 each 0   ezetimibe  (ZETIA ) 10 MG tablet Take 1 tablet (10 mg total) by mouth daily. 90 tablet 1   glucose blood (ACCU-CHEK GUIDE TEST) test strip Use as instructed to check blood sugars E11.9 200 each 1   guaiFENesin  (MUCINEX ) 600 MG 12 hr tablet Take 1 tablet (600 mg total) by mouth 2 (two) times daily. 60 tablet 1   isosorbide  mononitrate (IMDUR ) 60 MG 24 hr tablet TAKE 1 TABLET(60 MG) BY MOUTH DAILY 90 tablet 3   JANUMET  50-500 MG tablet TAKE 1 TABLET BY MOUTH TWICE DAILY WITH A MEAL 90 tablet 1   ketorolac  (ACULAR ) 0.5 % ophthalmic solution Place 1 drop into the right eye 2 (two) times daily.     Lancets (ONETOUCH DELICA PLUS LANCET33G) MISC CHECK BLOOD SUGAR BEFORE BREAKFAST AND DINNER 100 each 1   montelukast  (SINGULAIR ) 10 MG tablet Take 1 tablet (10 mg total) by mouth daily. 90 tablet 1   nitroGLYCERIN  (NITROSTAT ) 0.4 MG SL tablet Place 1  tablet (0.4 mg total) under the tongue every 5 (five) minutes as needed for chest pain. 25 tablet prn   pantoprazole  (PROTONIX ) 40 MG tablet Take 1 tablet (40 mg total) by mouth daily. 90 tablet 3   Polyvinyl Alcohol -Povidone PF (REFRESH) 1.4-0.6 % SOLN Place 1-2 drops into both eyes 3 (three) times daily as needed (dry/irritated eyes.).     prednisoLONE  acetate (PRED FORTE ) 1 % ophthalmic suspension Place 1 drop into both eyes in the morning, at noon, and at bedtime. Place 2 Drops in Left Eye, Place 3 Drops In Right Eye TID     pregabalin  (LYRICA ) 75 MG capsule TAKE 1 CAPSULE(75 MG) BY MOUTH TWICE DAILY 180 capsule 1   albuterol  (VENTOLIN  HFA) 108 (90 Base) MCG/ACT inhaler Inhale 2 puffs into the lungs every 6 (six) hours as needed for wheezing or shortness of breath. (Patient not taking: Reported on 11/26/2023) 18 g 12   amoxicillin -clavulanate (AUGMENTIN ) 875-125 MG tablet Take 1 tablet by mouth 2 (two) times daily. 20 tablet 0   No facility-administered medications prior to visit.        Objective:   Physical Exam Vitals:   11/26/23 1003  BP: (!) 181/99  Pulse: 78  Temp: 98.6 F (37 C)  TempSrc: Oral  SpO2: 100%  Weight: 161 lb 9.6 oz (73.3 kg)  Height: 5' 2 (1.575 m)   Gen: Pleasant, comfortable elderly woman, in no distress,  normal affect  ENT: No lesions,  mouth clear,  oropharynx clear, no postnasal drip  Neck: No JVD, no stridor  Lungs: No use of accessory  muscles, distant breath sounds, no crackles or wheezes or normal breath.  No wheezing on forced expiration.  Cardiovascular: RRR, heart sounds normal, no murmur or gallops, no peripheral edema  Musculoskeletal: No deformities, no cyanosis or clubbing  Neuro: alert, awake, non focal  Skin: Warm, no lesions or rash      Assessment & Plan:  Multiple lung nodules Multiple pulmonary nodules with a waxing and waning tree-in-bud clustered nodularity in the posterior right upper lobe that had initially improved,  persists and may be a bit more prominent than on most recent priors.  Suspicion for malignancy is lower given the waxing and waning nature of the abnormality.  That said there would be benefit to getting a tissue diagnosis, culture data to rule out malignancy, possibly rule in an inflammatory process like her sarcoid.  Discussed the pros and cons, risks and benefits of bronchoscopy and she has elected to proceed.   We will arrange for bronchoscopy to evaluate an opacity in your right upper lobe.  This will be done as an outpatient at [facility] under general anesthesia.  You will need someone to drive you and also someone to stay with you overnight that evening after the procedure.  We will try to get this scheduled for 12/08/2023. We will perform a repeat CT scan of your chest in preparation for the test. Follow-up in our office to review biopsy results to help guide next steps in treatment and surveillance.  Obstructive sleep apnea Confirmed good compliance with her CPAP, good clinical benefit.  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  She has seen Dr. Geronimo in the past.  Time spent 47 minutes reviewing CT scans of the chest, discussing differential diagnosis, discussing possible options including watchful waiting, bronchoscopy for culture data and tissue diagnosis.  Lamar Chris, MD, PhD 11/26/2023, 5:26 PM [facility] [phone] or if no answer before 7:00PM call [phone] For any issues after 7:00PM please call eLink [phone] The next 1 is hospital follow-up visit in

## 2023-12-03 ENCOUNTER — Ambulatory Visit (HOSPITAL_BASED_OUTPATIENT_CLINIC_OR_DEPARTMENT_OTHER)

## 2023-12-04 ENCOUNTER — Ambulatory Visit (HOSPITAL_BASED_OUTPATIENT_CLINIC_OR_DEPARTMENT_OTHER)
Admission: RE | Admit: 2023-12-04 | Discharge: 2023-12-04 | Disposition: A | Discharge status: Home / Self Care | Source: Ambulatory Visit | Attending: Emergency Medicine | Admitting: Emergency Medicine

## 2023-12-04 DIAGNOSIS — I700 Atherosclerosis of aorta: Secondary | ICD-10-CM | POA: Diagnosis not present

## 2023-12-04 DIAGNOSIS — R911 Solitary pulmonary nodule: Secondary | ICD-10-CM | POA: Diagnosis not present

## 2023-12-04 DIAGNOSIS — J439 Emphysema, unspecified: Secondary | ICD-10-CM | POA: Diagnosis not present

## 2023-12-04 DIAGNOSIS — R918 Other nonspecific abnormal finding of lung field: Secondary | ICD-10-CM | POA: Diagnosis not present

## 2023-12-07 ENCOUNTER — Other Ambulatory Visit: Payer: Self-pay

## 2023-12-07 ENCOUNTER — Encounter (HOSPITAL_COMMUNITY): Payer: Self-pay | Admitting: Emergency Medicine

## 2023-12-07 DIAGNOSIS — H401113 Primary open-angle glaucoma, right eye, severe stage: Secondary | ICD-10-CM | POA: Diagnosis not present

## 2023-12-07 DIAGNOSIS — H401122 Primary open-angle glaucoma, left eye, moderate stage: Secondary | ICD-10-CM | POA: Diagnosis not present

## 2023-12-07 DIAGNOSIS — M1990 Unspecified osteoarthritis, unspecified site: Secondary | ICD-10-CM | POA: Diagnosis not present

## 2023-12-07 DIAGNOSIS — H209 Unspecified iridocyclitis: Secondary | ICD-10-CM | POA: Diagnosis not present

## 2023-12-07 DIAGNOSIS — H15009 Unspecified scleritis, unspecified eye: Secondary | ICD-10-CM | POA: Diagnosis not present

## 2023-12-07 DIAGNOSIS — R760 Raised antibody titer: Secondary | ICD-10-CM | POA: Diagnosis not present

## 2023-12-07 DIAGNOSIS — M7989 Other specified soft tissue disorders: Secondary | ICD-10-CM | POA: Diagnosis not present

## 2023-12-07 DIAGNOSIS — Z79899 Other long term (current) drug therapy: Secondary | ICD-10-CM | POA: Diagnosis not present

## 2023-12-07 DIAGNOSIS — J8410 Pulmonary fibrosis, unspecified: Secondary | ICD-10-CM | POA: Diagnosis not present

## 2023-12-07 DIAGNOSIS — M0609 Rheumatoid arthritis without rheumatoid factor, multiple sites: Secondary | ICD-10-CM | POA: Diagnosis not present

## 2023-12-07 DIAGNOSIS — R911 Solitary pulmonary nodule: Secondary | ICD-10-CM | POA: Diagnosis not present

## 2023-12-07 DIAGNOSIS — M2550 Pain in unspecified joint: Secondary | ICD-10-CM | POA: Diagnosis not present

## 2023-12-07 DIAGNOSIS — D869 Sarcoidosis, unspecified: Secondary | ICD-10-CM | POA: Diagnosis not present

## 2023-12-07 LAB — HM DIABETES EYE EXAM

## 2023-12-07 NOTE — Progress Notes (Signed)
 Anesthesia Chart Review: Same day workup  78 year old female follows with cardiology for history of CAD (s/p orbital atherectomy and PCI/DES to prox/mid LAD 2018), HLD, palpitations, NSVT, vasovagal syncope.  Cardiac PET CT 03/2022 was low risk, nonischemic.  Admitted 6/29-08/11/23 with syncope felt to be related to dehydration in setting of nausea, vomiting, diarrhea. Treated for community acquired PNA and UTI. Carotid duplex 08/10/23 bilateral 1-39% stenosis. Echo 08/11/23 LVEF 55-60%, no RWMA, no significant valvular abnormalities. She was discharged with home health PT. last seen in cardiology follow-up by Reche Finder, NP on 08/24/2023.  At that time she was still dealing with persistent cough and prolonged recovery from pneumonia due to underlying respiratory issues.  No cardiac complaints, no changes to management.  Follows with pulmonology for history of former smoker, COPD, asthma, OSA on CPAP, sarcoidosis, interstitial lung disease.  Last seen by Dr. Shelah 11/26/2023.  Per note, she has a history of multiple pulmonary nodules with a waxing and waning tree-in-bud clustered nodularity in the posterior right upper lobe that had initially improved, persists and may be a bit more prominent than on most recent prior scans.  She was recommended undergo bronchoscopy and biopsy for tissue diagnosis.  Other pertinent history includes GERD on PPI, hiatal hernia, non-insulin -dependent DM2.  Patient will need day of surgery labs and evaluation.  EKG 08/09/2023: Sinus tachycardia.  Rate 115. Paired ventricular premature complexes. Aberrant complex. Probable LVH with secondary repol abnrm. Anterior Q waves, possibly due to LVH  TTE 08/11/2023: 1. Apical views could not be obtained. Left ventricular ejection  fraction, by estimation, is 55 to 60%. The left ventricle has normal  function. The left ventricle has no regional wall motion abnormalities.  Left ventricular diastolic function could not  be evaluated.    2. Right ventricular systolic function is normal. The right ventricular  size is normal.   3. The mitral valve is normal in structure. No evidence of mitral valve  regurgitation. No evidence of mitral stenosis.   4. The aortic valve is normal in structure. Aortic valve regurgitation is  not visualized. No aortic stenosis is present.   5. The inferior vena cava is normal in size with greater than 50%  respiratory variability, suggesting right atrial pressure of 3 mmHg.   Comparison(s): No significant change from prior study. Prior images  reviewed side by side.   Cardiac PET/CT 03/18/2022:   The study is normal. The study is low risk.   LV perfusion is normal. There is no evidence of ischemia. There is no evidence of infarction.   Rest left ventricular function is normal. Rest EF: 58 %. Stress left ventricular function is normal. Stress EF: 61 %. End diastolic cavity size is normal. End systolic cavity size is normal. No evidence of transient ischemic dilation (TID) noted.   Myocardial blood flow was computed to be 1.38ml/g/min at rest and 2.13ml/g/min at stress. Global myocardial blood flow reserve was 2.10 and was normal.   Coronary calcium  assessment not performed due to prior revascularization.   Electronically signed by: Gayatri A Acharya, MD      Lynwood Hope, PA-C [facility]/Anesthesiology Phone [phone] 12/07/2023 12:29 PM

## 2023-12-07 NOTE — Anesthesia Preprocedure Evaluation (Signed)
 Anesthesia Evaluation  Patient identified by MRN, date of birth, ID band Patient awake    Reviewed: Allergy & Precautions, NPO status , Patient's Chart, lab work & pertinent test results, reviewed documented beta blocker date and time   History of Anesthesia Complications Negative for: history of anesthetic complications  Airway Mallampati: I  TM Distance: >3 FB Neck ROM: Full    Dental  (+) Dental Advisory Given, Missing   Pulmonary asthma , sleep apnea and Continuous Positive Airway Pressure Ventilation , COPD,  COPD inhaler, Recent URI , Residual Cough, former smoker   breath sounds clear to auscultation       Cardiovascular hypertension, Pt. on medications and Pt. on home beta blockers (-) angina + CAD, + Cardiac Stents and + Peripheral Vascular Disease   Rhythm:Regular Rate:Normal  08/2023 ECHO: EF 55 to 60%.  1.The LV has normal function. The left ventricle has no regional wall motion abnormalities.   2. RVF is normal. The right ventricular size is normal.   3. The mitral valve is normal in structure. No evidence of mitral valve regurgitation. No evidence of mitral stenosis.   4. The aortic valve is normal in structure. Aortic valve regurgitation is not visualized. No aortic stenosis is present.   '24 stress:   The study is normal. The study is low risk.   LV perfusion is normal. There is no evidence of ischemia. There is no evidence of infarction.   Rest left ventricular function is normal. Rest EF: 58 %. Stress left ventricular function is normal. Stress EF: 61 %. End diastolic cavity size is normal. End systolic cavity size is normal. No evidence of transient ischemic dilation (TID) noted.   Myocardial blood flow was computed to be 1.43ml/g/min at rest and 2.57ml/g/min at stress. Global myocardial blood flow reserve was 2.10 and was normal.    Neuro/Psych Seizures -,   Anxiety Depression    TIA   GI/Hepatic Neg liver  ROS, hiatal hernia,GERD  Medicated and Controlled,,  Endo/Other  diabetes, Oral Hypoglycemic Agents  Glu 136 BMI 29  Renal/GU negative Renal ROS     Musculoskeletal  (+) Arthritis ,  Fibromyalgia -  Abdominal   Peds  Hematology   Anesthesia Other Findings   Reproductive/Obstetrics                              Anesthesia Physical Anesthesia Plan  ASA: 3  Anesthesia Plan: General   Post-op Pain Management: Tylenol  PO (pre-op)*   Induction: Intravenous  PONV Risk Score and Plan: 3 and Ondansetron , Dexamethasone  and Treatment may vary due to age or medical condition  Airway Management Planned: Oral ETT  Additional Equipment: None  Intra-op Plan:   Post-operative Plan: Extubation in OR  Informed Consent:   Plan Discussed with: CRNA and Surgeon  Anesthesia Plan Comments: (Pt with active URI: discussed with Dr. Shelah, procedure elective, no urgency with diagnosis, cancelled until pt's URI resolved   PAT note by Lynwood Hope, PA-C: 78 year old female follows with cardiology for history of CAD (s/p orbital atherectomy and PCI/DES to prox/mid LAD 2018), HLD, palpitations, NSVT, vasovagal syncope.  Cardiac PET CT 03/2022 was low risk, nonischemic.  Admitted 6/29-08/11/23 with syncope felt to be related to dehydration in setting of nausea, vomiting, diarrhea. Treated for community acquired PNA and UTI. Carotid duplex 08/10/23 bilateral 1-39% stenosis. Echo 08/11/23 LVEF 55-60%, no RWMA, no significant valvular abnormalities. She was discharged with home health PT. last  seen in cardiology follow-up by Reche Finder, NP on 08/24/2023.  At that time she was still dealing with persistent cough and prolonged recovery from pneumonia due to underlying respiratory issues.  No cardiac complaints, no changes to management.  Follows with pulmonology for history of former smoker, COPD, asthma, OSA on CPAP, sarcoidosis, interstitial lung disease.  Last seen by Dr. Shelah  11/26/2023.  Per note, she has a history of multiple pulmonary nodules with a waxing and waning tree-in-bud clustered nodularity in the posterior right upper lobe that had initially improved, persists and may be a bit more prominent than on most recent prior scans.  She was recommended undergo bronchoscopy and biopsy for tissue diagnosis.  Other pertinent history includes GERD on PPI, hiatal hernia, non-insulin -dependent DM2.  Patient will need day of surgery labs and evaluation.  EKG 08/09/2023: Sinus tachycardia.  Rate 115. Paired ventricular premature complexes. Aberrant complex. Probable LVH with secondary repol abnrm. Anterior Q waves, possibly due to LVH  TTE 08/11/2023: 1. Apical views could not be obtained. Left ventricular ejection  fraction, by estimation, is 55 to 60%. The left ventricle has normal  function. The left ventricle has no regional wall motion abnormalities.  Left ventricular diastolic function could not  be evaluated.  2. Right ventricular systolic function is normal. The right ventricular  size is normal.  3. The mitral valve is normal in structure. No evidence of mitral valve  regurgitation. No evidence of mitral stenosis.  4. The aortic valve is normal in structure. Aortic valve regurgitation is  not visualized. No aortic stenosis is present.  5. The inferior vena cava is normal in size with greater than 50%  respiratory variability, suggesting right atrial pressure of 3 mmHg.   Comparison(s): No significant change from prior study. Prior images  reviewed side by side.   Cardiac PET/CT 03/18/2022:   The study is normal. The study is low risk.   LV perfusion is normal. There is no evidence of ischemia. There is no evidence of infarction.   Rest left ventricular function is normal. Rest EF: 58 %. Stress left ventricular function is normal. Stress EF: 61 %. End diastolic cavity size is normal. End systolic cavity size is normal. No evidence of transient ischemic  dilation (TID) noted.   Myocardial blood flow was computed to be 1.30ml/g/min at rest and 2.56ml/g/min at stress. Global myocardial blood flow reserve was 2.10 and was normal.   Coronary calcium  assessment not performed due to prior revascularization.   Electronically signed by: Gayatri A Acharya, MD  )         Anesthesia Quick Evaluation

## 2023-12-07 NOTE — Progress Notes (Signed)
 SDW call  Patient was given pre-op instructions over the phone. Patient verbalized understanding of instructions provided.     PCP - Dr. Catheryn Slocumb Cardiologist - Dr. Annabella Scarce Neurology: Harlene Bogaert, NP Pulmonary:    PPM/ICD - denies Device Orders - na Rep Notified - na   Chest x-ray - 08/09/2023 EKG -  08/12/2023 Stress Test - 07/20/2017 ECHO - 08/11/2023 Cardiac Cath -  PFT: 02/23/2023  Sleep Study/sleep apnea/CPAP: OSA with CPAP  Type II diabetic. A1C 6.2 on 08/10/2023 Fasting Blood sugar range: 80-130 How often check sugars: daily Janumet , hold DOS   Blood Thinner Instructions: denies Aspirin  Instructions:states last dose 12/01/2023   ERAS Protcol - NPO   Anesthesia review: Vtach, CAD, HTN, DM, COPD,    Patient denies shortness of breath, fever, cough and chest pain over the phone call  Your procedure is scheduled on Tuesday December 08, 2023  Report to [facility] Main Entrance A at  0900  A.M., then check in with the Admitting office.  Call this number if you have problems the morning of surgery:  [phone]   If you have any questions prior to your surgery date call [phone]: Open Monday-Friday 8am-4pm If you experience any cold or flu symptoms such as cough, fever, chills, shortness of breath, etc. between now and your scheduled surgery, please notify us  at the above number     Remember:  Do not eat or drink after midnight the night before your surgery  Take these medicines the morning of surgery with A SIP OF WATER:  Atorvastatin , all eye drops, singulair , protonix , lyrica , carvedilol , mucinex , isosorbide ,   As needed: Tylenol , albuterol  neb, nitroglycerin   As of today, STOP taking any Aspirin  (unless otherwise instructed by your surgeon) Aleve, Naproxen, Ibuprofen , Motrin , Advil , Goody's, BC's, all herbal medications, fish oil, and all vitamins.

## 2023-12-08 ENCOUNTER — Encounter (HOSPITAL_COMMUNITY): Payer: Self-pay | Admitting: Emergency Medicine

## 2023-12-08 ENCOUNTER — Encounter (HOSPITAL_COMMUNITY): Payer: Self-pay | Admitting: Physician Assistant

## 2023-12-08 ENCOUNTER — Telehealth: Payer: Self-pay | Admitting: Emergency Medicine

## 2023-12-08 ENCOUNTER — Ambulatory Visit (HOSPITAL_COMMUNITY)
Admission: RE | Admit: 2023-12-08 | Discharge: 2023-12-08 | Disposition: A | Discharge status: Home / Self Care | Attending: Emergency Medicine | Admitting: Emergency Medicine

## 2023-12-08 ENCOUNTER — Encounter (HOSPITAL_COMMUNITY)
Admission: RE | Disposition: A | Discharge status: Home / Self Care | Payer: Self-pay | Source: Home / Self Care | Attending: Emergency Medicine

## 2023-12-08 ENCOUNTER — Ambulatory Visit: Admitting: Obstetrics and Gynecology

## 2023-12-08 ENCOUNTER — Ambulatory Visit (HOSPITAL_COMMUNITY): Payer: Self-pay | Admitting: Physician Assistant

## 2023-12-08 ENCOUNTER — Other Ambulatory Visit: Payer: Self-pay

## 2023-12-08 DIAGNOSIS — G4733 Obstructive sleep apnea (adult) (pediatric): Secondary | ICD-10-CM | POA: Diagnosis not present

## 2023-12-08 DIAGNOSIS — E785 Hyperlipidemia, unspecified: Secondary | ICD-10-CM | POA: Diagnosis not present

## 2023-12-08 DIAGNOSIS — I110 Hypertensive heart disease with heart failure: Secondary | ICD-10-CM | POA: Diagnosis not present

## 2023-12-08 DIAGNOSIS — I50810 Right heart failure, unspecified: Secondary | ICD-10-CM | POA: Diagnosis not present

## 2023-12-08 DIAGNOSIS — K449 Diaphragmatic hernia without obstruction or gangrene: Secondary | ICD-10-CM | POA: Insufficient documentation

## 2023-12-08 DIAGNOSIS — E119 Type 2 diabetes mellitus without complications: Secondary | ICD-10-CM | POA: Insufficient documentation

## 2023-12-08 DIAGNOSIS — Z87891 Personal history of nicotine dependence: Secondary | ICD-10-CM | POA: Insufficient documentation

## 2023-12-08 DIAGNOSIS — J4489 Other specified chronic obstructive pulmonary disease: Secondary | ICD-10-CM | POA: Insufficient documentation

## 2023-12-08 DIAGNOSIS — Z539 Procedure and treatment not carried out, unspecified reason: Secondary | ICD-10-CM | POA: Diagnosis not present

## 2023-12-08 DIAGNOSIS — M797 Fibromyalgia: Secondary | ICD-10-CM | POA: Diagnosis not present

## 2023-12-08 DIAGNOSIS — Z7984 Long term (current) use of oral hypoglycemic drugs: Secondary | ICD-10-CM | POA: Diagnosis not present

## 2023-12-08 DIAGNOSIS — I2510 Atherosclerotic heart disease of native coronary artery without angina pectoris: Secondary | ICD-10-CM | POA: Diagnosis not present

## 2023-12-08 DIAGNOSIS — R911 Solitary pulmonary nodule: Secondary | ICD-10-CM | POA: Insufficient documentation

## 2023-12-08 DIAGNOSIS — Z79899 Other long term (current) drug therapy: Secondary | ICD-10-CM | POA: Insufficient documentation

## 2023-12-08 DIAGNOSIS — D869 Sarcoidosis, unspecified: Secondary | ICD-10-CM | POA: Insufficient documentation

## 2023-12-08 DIAGNOSIS — K219 Gastro-esophageal reflux disease without esophagitis: Secondary | ICD-10-CM | POA: Insufficient documentation

## 2023-12-08 HISTORY — DX: Transient cerebral ischemic attack, unspecified: G45.9

## 2023-12-08 LAB — BASIC METABOLIC PANEL WITH GFR
Anion gap: 9 (ref 5–15)
BUN: 14 mg/dL (ref 8–23)
CO2: 22 mmol/L (ref 22–32)
Calcium: 8.7 mg/dL — ABNORMAL LOW (ref 8.9–10.3)
Chloride: 106 mmol/L (ref 98–111)
Creatinine, Ser: 0.83 mg/dL (ref 0.44–1.00)
GFR, Estimated: >60 mL/min (ref >60)
Glucose, Bld: 128 mg/dL — ABNORMAL HIGH (ref 70–99)
Potassium: 3.9 mmol/L (ref 3.5–5.1)
Sodium: 137 mmol/L (ref 135–145)

## 2023-12-08 LAB — CBC
HCT: 34.3 % — ABNORMAL LOW (ref 36.0–46.0)
Hemoglobin: 10.8 g/dL — ABNORMAL LOW (ref 12.0–15.0)
MCH: 27.3 pg (ref 26.0–34.0)
MCHC: 31.5 g/dL (ref 30.0–36.0)
MCV: 86.6 fL (ref 80.0–100.0)
Platelets: 188 K/uL (ref 150–400)
RBC: 3.96 MIL/uL (ref 3.87–5.11)
RDW: 14 % (ref 11.5–15.5)
WBC: 5.1 K/uL (ref 4.0–10.5)
nRBC: 0 % (ref 0.0–0.2)

## 2023-12-08 LAB — GLUCOSE, CAPILLARY: Glucose-Capillary: 136 mg/dL — ABNORMAL HIGH (ref 70–99)

## 2023-12-08 SURGERY — CANCELLED PROCEDURE
Anesthesia: General

## 2023-12-08 MED ORDER — LACTATED RINGERS IV SOLN: INTRAVENOUS | Status: DC

## 2023-12-08 MED ORDER — ACETAMINOPHEN 500 MG PO TABS
1000.0000 mg | ORAL_TABLET | Freq: Once | ORAL | Status: AC
Start: 2023-12-08 — End: 2023-12-08
  Administered 2023-12-08: 1000 mg via ORAL
  Filled 2023-12-08: qty 2

## 2023-12-08 MED ORDER — INSULIN ASPART 100 UNIT/ML IJ SOLN
0.0000 [IU] | INTRAMUSCULAR | Status: DC | PRN
  Filled 2023-12-08: qty 0.07

## 2023-12-08 MED ORDER — DOXYCYCLINE HYCLATE 100 MG PO TABS: 100.0000 mg | ORAL_TABLET | Freq: Two times a day (BID) | ORAL | 0 refills | Status: DC

## 2023-12-08 MED ORDER — CHLORHEXIDINE GLUCONATE 0.12 % MT SOLN
15.0000 mL | Freq: Once | OROMUCOSAL | Status: AC
  Administered 2023-12-08: 15 mL via OROMUCOSAL
  Filled 2023-12-08: qty 15

## 2023-12-08 SURGICAL SUPPLY — 36 items
ADAPTER BRONCHOSCOPE OLYMPUS (ADAPTER) ×2 IMPLANT
ADAPTER VALVE BIOPSY EBUS (MISCELLANEOUS) IMPLANT
BAG COUNTER SPONGE SURGICOUNT (BAG) ×2 IMPLANT
BRUSH CYTOL CELLEBRITY 1.5X140 (MISCELLANEOUS) ×2 IMPLANT
BRUSH SUPERTRAX BIOPSY (INSTRUMENTS) IMPLANT
BRUSH SUPERTRAX NDL-TIP CYTO (INSTRUMENTS) ×2 IMPLANT
CANISTER SUCTION 3000ML PPV (SUCTIONS) ×2 IMPLANT
CNTNR URN SCR LID CUP LEK RST (MISCELLANEOUS) ×2 IMPLANT
COVER BACK TABLE 60X90IN (DRAPES) ×2 IMPLANT
FILTER STRAW FLUID ASPIR (MISCELLANEOUS) IMPLANT
FORCEPS BIOP 1.5 SINGLE USE (MISCELLANEOUS) ×2 IMPLANT
FORCEPS BIOP SUPERTRX PREMAR (INSTRUMENTS) ×2 IMPLANT
GAUZE SPONGE 4X4 12PLY STRL (GAUZE/BANDAGES/DRESSINGS) ×2 IMPLANT
GLOVE BIO SURGEON STRL SZ7.5 (GLOVE) ×4 IMPLANT
GOWN STRL REUS W/ TWL LRG LVL3 (GOWN DISPOSABLE) ×4 IMPLANT
KIT CLEAN ENDO COMPLIANCE (KITS) ×2 IMPLANT
KIT LOCATABLE GUIDE (CANNULA) IMPLANT
KIT MARKER FIDUCIAL DELIVERY (KITS) IMPLANT
KIT TURNOVER KIT B (KITS) ×2 IMPLANT
MARKER SKIN DUAL TIP RULER LAB (MISCELLANEOUS) ×2 IMPLANT
NDL SUPERTRX PREMARK BIOPSY (NEEDLE) ×1 IMPLANT
NEEDLE SUPERTRX PREMARK BIOPSY (NEEDLE) ×2 IMPLANT
OIL SILICONE PENTAX (PARTS (SERVICE/REPAIRS)) ×2 IMPLANT
PAD ARMBOARD POSITIONER FOAM (MISCELLANEOUS) ×4 IMPLANT
PATCHES PATIENT (LABEL) ×6 IMPLANT
SOLN 0.9% NACL POUR BTL 1000ML (IV SOLUTION) ×2 IMPLANT
SOLN STERILE WATER BTL 1000 ML (IV SOLUTION) ×2 IMPLANT
SYR 20ML ECCENTRIC (SYRINGE) ×2 IMPLANT
SYR 20ML LL LF (SYRINGE) ×2 IMPLANT
SYR 50ML SLIP (SYRINGE) ×2 IMPLANT
TOWEL GREEN STERILE FF (TOWEL DISPOSABLE) ×2 IMPLANT
TRAP SPECIMEN MUCUS 40CC (MISCELLANEOUS) IMPLANT
TUBE CONNECTING 20X1/4 (TUBING) ×2 IMPLANT
UNDERPAD 30X36 HEAVY ABSORB (UNDERPADS AND DIAPERS) ×2 IMPLANT
VALVE BIOPSY SINGLE USE (MISCELLANEOUS) ×2 IMPLANT
VALVE SUCTION BRONCHIO DISP (MISCELLANEOUS) ×2 IMPLANT

## 2023-12-08 NOTE — Interval H&P Note (Addendum)
 History and Physical Interval Note:  12/08/2023 9:43 AM  Sheila Reynolds  has presented today for surgery, with the diagnosis of Right upper lobe nodule.  The various methods of treatment have been discussed with the patient and family. After consideration of risks, benefits and other options for treatment, the patient has consented to  Procedure(s): VIDEO BRONCHOSCOPY WITH ENDOBRONCHIAL NAVIGATION (Right) as a surgical intervention.   She has been experiencing increased sinus congestion, allergy symptoms versus possible evolving URI with cough.  We discussed the pros and cons of proceeding given that she has symptoms consistent with a URI and may be developing bronchitis.  I think it would be best for us  to defer and get her rescheduled after the symptoms resolve.  I am going to send a prescription for doxycycline  to her pharmacy.   Lamar GORMAN Chris

## 2023-12-08 NOTE — Telephone Encounter (Signed)
 Sheila Reynolds has URI symptoms, may be developing an acute bronchitis.  We deferred her bronchoscopy today 10/28 and will need to get this rescheduled in a few weeks.  I sent a prescription for doxycycline  to her pharmacy.  Please try to get her scheduled 11/17 or 11/18.  Thank you.

## 2023-12-08 NOTE — Progress Notes (Signed)
 Pt procedure cancelled due to upper respiratory symptoms. Dr. Leonce and Dr. Shelah aware of changes.

## 2023-12-09 ENCOUNTER — Encounter: Payer: Self-pay | Admitting: Emergency Medicine

## 2023-12-09 NOTE — Telephone Encounter (Signed)
 Spoke to the patient gave new appt info will also mail copy of letter

## 2023-12-14 ENCOUNTER — Ambulatory Visit: Admitting: Acute Care

## 2023-12-14 DIAGNOSIS — N3000 Acute cystitis without hematuria: Secondary | ICD-10-CM | POA: Diagnosis not present

## 2023-12-14 DIAGNOSIS — E119 Type 2 diabetes mellitus without complications: Secondary | ICD-10-CM | POA: Diagnosis not present

## 2023-12-14 DIAGNOSIS — J45909 Unspecified asthma, uncomplicated: Secondary | ICD-10-CM | POA: Diagnosis not present

## 2023-12-14 DIAGNOSIS — J189 Pneumonia, unspecified organism: Secondary | ICD-10-CM | POA: Diagnosis not present

## 2023-12-14 DIAGNOSIS — I10 Essential (primary) hypertension: Secondary | ICD-10-CM | POA: Diagnosis not present

## 2023-12-14 DIAGNOSIS — J8410 Pulmonary fibrosis, unspecified: Secondary | ICD-10-CM | POA: Diagnosis not present

## 2023-12-22 ENCOUNTER — Encounter: Payer: Self-pay | Admitting: Internal Medicine

## 2023-12-22 ENCOUNTER — Ambulatory Visit: Payer: Self-pay

## 2023-12-22 ENCOUNTER — Ambulatory Visit: Admitting: Internal Medicine

## 2023-12-22 ENCOUNTER — Ambulatory Visit: Payer: Self-pay | Admitting: Internal Medicine

## 2023-12-22 VITALS — BP 130/80 | HR 85 | Temp 98.5°F | Ht 62.0 in | Wt 160.0 lb

## 2023-12-22 DIAGNOSIS — N3000 Acute cystitis without hematuria: Secondary | ICD-10-CM | POA: Diagnosis not present

## 2023-12-22 DIAGNOSIS — R350 Frequency of micturition: Secondary | ICD-10-CM

## 2023-12-22 DIAGNOSIS — Z6829 Body mass index (BMI) 29.0-29.9, adult: Secondary | ICD-10-CM

## 2023-12-22 DIAGNOSIS — R300 Dysuria: Secondary | ICD-10-CM

## 2023-12-22 LAB — POCT URINALYSIS DIP (CLINITEK)
Bilirubin, UA: NEGATIVE
Glucose, UA: NEGATIVE mg/dL
Nitrite, UA: NEGATIVE
POC PROTEIN,UA: 30 — AB
Spec Grav, UA: 1.025 (ref 1.010–1.025)
Urobilinogen, UA: 1 U/dL
pH, UA: 6 (ref 5.0–8.0)

## 2023-12-22 MED ORDER — CEFTRIAXONE SODIUM 500 MG IJ SOLR
500.0000 mg | Freq: Once | INTRAMUSCULAR | Status: AC
  Administered 2023-12-22: 500 mg via INTRAMUSCULAR

## 2023-12-22 NOTE — Telephone Encounter (Signed)
 FYI Only or Action Required?: Action required by provider: clinical question for provider.  Patient was last seen in primary care on 12/22/2023 by [name], MD.  Called Nurse Triage reporting Cough.  Symptoms began a week ago.  Interventions attempted: Prescription medications: ABX.  Symptoms are: gradually worsening.  Triage Disposition: See Physician Within 24 Hours  Patient/caregiver understands and will follow disposition?: No, wishes to speak with PCP   Copied from CRM #[id number]. Topic: Clinical - Red Word Triage >> Dec 22, 2023  5:01 PM Rilla B wrote: [facility] that prompted transfer to Nurse Triage: cough, coughing up yellow mucous, diff breathing  Infection in lung - Reason for Disposition  SEVERE coughing spells (e.g., whooping sound after coughing, vomiting after coughing)  Answer Assessment - Initial Assessment Questions 1. ONSET: When did the cough begin?      X 3 weeks and worsening 2. SEVERITY: How bad is the cough today?      Moderate to severe 3. SPUTUM: Describe the color of your sputum (e.g., none, dry cough; clear, white, yellow, green)     Yellowish  4. HEMOPTYSIS: Are you coughing up any blood? If Yes, ask: How much? (e.g., flecks, streaks, tablespoons, etc.)     no 5. DIFFICULTY BREATHING: Are you having difficulty breathing? If Yes, ask: How bad is it? (e.g., mild, moderate, severe)      Mild : moderate with coughing 6. FEVER: Do you have a fever? If Yes, ask: What is your temperature, how was it measured, and when did it start?     no 7. CARDIAC HISTORY: Do you have any history of heart disease? (e.g., heart attack, congestive heart failure)      na 8. LUNG HISTORY: Do you have any history of lung disease?  (e.g., pulmonary embolus, asthma, emphysema)     no 9. PE RISK FACTORS: Do you have a history of blood clots? (or: recent major surgery, recent prolonged travel, bedridden)     na 10. OTHER SYMPTOMS: Do you have  any other symptoms? (e.g., runny nose, wheezing, chest pain)       Runny nose, SOB, 11. PREGNANCY: Is there any chance you are pregnant? When was your last menstrual period?       na 12. TRAVEL: Have you traveled out of the country in the last month? (e.g., travel history, exposures)       na  Spot on lung that comes and goes.     3 weeks ago had procedure scheduled but was not able to do it due to infection found in lung. New procedure date 12/28/2023.  Pt did complete abx but is still having cough, yellow mucous, runny nose, SOB.  Pt was seen by PCP for UTI: pt received a penicillicin shot today.  Pt would like a call back with updated plan of care.  Protocols used: Cough - Acute Productive-A-AH

## 2023-12-22 NOTE — Progress Notes (Deleted)
 LILLETTE Kristeen JINNY Gladis, CMA,acting as a scribe for [name], MD.,have documented all relevant documentation on the behalf of [name], MD,as directed by  [name], MD while in the presence of [name], MD.  Subjective:  Patient ID: Sheila Reynolds , female    DOB: 1945-03-29 , 78 y.o.   MRN: [id number]  No chief complaint on file.   HPI  HPI   Past Medical History:  Diagnosis Date   Anemia    3 months ago anemic   Anxiety    on meds   Arthritis    all over (01/15/2017)   Asthma    Bronchitis with emphysema    Chest pain    Chronic bronchitis (HCC)    Coronary artery disease    a. 01/2017 she underwent orbital atherectomy/DES to the proxmal LAD and PTCA to ostial D2. 2D Echo 01/15/17 showed mild LVH, EF 60-65%, grade 1 DD.   Family history of anesthesia complication    daughter N/V   Fibromyalgia    GERD (gastroesophageal reflux disease)    on meds   Glaucoma    Heart murmur    History of hiatal hernia    Hx of echocardiogram    Echo (03/2013):  Tech limited; Mild focal basal septal hypertrophy, EF 60-65%, normal RVF   Hyperlipidemia    Hypertension    Nausea vomiting and diarrhea 08/24/2023   Nonsustained ventricular tachycardia (HCC)    OSA on CPAP    Premature atrial contractions    PVC (premature ventricular contraction)    a. Holter 12/16: NSR, occ PAC,PVCs   Sarcoidosis    TIA (transient ischemic attack)    Type II diabetes mellitus (HCC)      Family History  Problem Relation Age of Onset   Heart disease Father    Diabetes Father    Glaucoma Father    Cancer Mother        unknown type, ?lung   Heart disease Paternal Grandmother    Cancer Paternal Grandmother        unknown type   Diabetes Brother    Glaucoma Brother      Current Outpatient Medications:    acetaminophen  (TYLENOL ) 500 MG tablet, Take 1,000 mg by mouth 2 (two) times daily as needed for moderate pain or headache., Disp: , Rfl:    albuterol  (PROVENTIL ) (2.5  MG/3ML) 0.083% nebulizer solution, Take 3 mLs (2.5 mg total) by nebulization daily as needed for wheezing or shortness of breath., Disp: 75 mL, Rfl: 12   albuterol  (VENTOLIN  HFA) 108 (90 Base) MCG/ACT inhaler, Inhale 2 puffs into the lungs every 6 (six) hours as needed for wheezing or shortness of breath. (Patient not taking: Reported on 11/26/2023), Disp: 18 g, Rfl: 12   amoxicillin -clavulanate (AUGMENTIN ) 875-125 MG tablet, Take 1 tablet by mouth 2 (two) times daily. (Patient not taking: Reported on 12/07/2023), Disp: 20 tablet, Rfl: 0   aspirin  EC 81 MG tablet, Take 81 mg by mouth daily. Swallow whole., Disp: , Rfl:    atorvastatin  (LIPITOR) 80 MG tablet, Take 1 tablet (80 mg total) by mouth daily., Disp: 90 tablet, Rfl: 3   azaTHIOprine  (IMURAN ) 50 MG tablet, Take 1 tablet by mouth daily., Disp: , Rfl:    benzonatate  (TESSALON  PERLES) 100 MG capsule, Take 1-2 tablet as needed by mouth three times per day for cough. (Patient not taking: Reported on 12/07/2023), Disp: 30 capsule, Rfl: 1   Blood Glucose Monitoring Suppl (ACCU-CHEK AVIVA PLUS)  w/Device KIT, Use to check blood sugars 3 times a day. Dx code e11.65, Disp: 1 kit, Rfl: 3   Blood Glucose Monitoring Suppl (ACCU-CHEK GUIDE) w/Device KIT, USE AS NEEDED TO CHECK BLOOD SUGARS., Disp: 1 kit, Rfl: 0   brimonidine  (ALPHAGAN  P) 0.1 % SOLN, Place 1 drop into both eyes in the morning, at noon, and at bedtime., Disp: , Rfl:    brinzolamide  (AZOPT ) 1 % ophthalmic suspension, Place 1 drop into both eyes 3 (three) times daily., Disp: , Rfl:    carvedilol  (COREG ) 12.5 MG tablet, Take 1 tablet (12.5 mg total) by mouth 2 (two) times daily with a meal., Disp: 180 tablet, Rfl: 3   doxycycline  (VIBRA -TABS) 100 MG tablet, Take 1 tablet (100 mg total) by mouth 2 (two) times daily., Disp: 14 tablet, Rfl: 0   EPINEPHrine  0.3 mg/0.3 mL IJ SOAJ injection, Inject 0.3 mg into the muscle as needed for anaphylaxis., Disp: 1 each, Rfl: 0   ezetimibe  (ZETIA ) 10 MG tablet,  Take 1 tablet (10 mg total) by mouth daily. (Patient taking differently: Take 10 mg by mouth at bedtime.), Disp: 90 tablet, Rfl: 1   glucose blood (ACCU-CHEK GUIDE TEST) test strip, Use as instructed to check blood sugars E11.9, Disp: 200 each, Rfl: 1   guaiFENesin  (MUCINEX ) 600 MG 12 hr tablet, Take 1 tablet (600 mg total) by mouth 2 (two) times daily. (Patient taking differently: Take 600 mg by mouth daily.), Disp: 60 tablet, Rfl: 1   isosorbide  mononitrate (IMDUR ) 60 MG 24 hr tablet, TAKE 1 TABLET(60 MG) BY MOUTH DAILY, Disp: 90 tablet, Rfl: 3   JANUMET  50-500 MG tablet, TAKE 1 TABLET BY MOUTH TWICE DAILY WITH A MEAL, Disp: 90 tablet, Rfl: 1   ketorolac  (ACULAR ) 0.5 % ophthalmic solution, Place 1 drop into the right eye 2 (two) times daily., Disp: , Rfl:    Lancets (ONETOUCH DELICA PLUS LANCET33G) MISC, CHECK BLOOD SUGAR BEFORE BREAKFAST AND DINNER, Disp: 100 each, Rfl: 1   montelukast  (SINGULAIR ) 10 MG tablet, Take 1 tablet (10 mg total) by mouth daily., Disp: 90 tablet, Rfl: 1   nitroGLYCERIN  (NITROSTAT ) 0.4 MG SL tablet, Place 1 tablet (0.4 mg total) under the tongue every 5 (five) minutes as needed for chest pain., Disp: 25 tablet, Rfl: prn   pantoprazole  (PROTONIX ) 40 MG tablet, Take 1 tablet (40 mg total) by mouth daily., Disp: 90 tablet, Rfl: 3   Polyvinyl Alcohol -Povidone PF (REFRESH) 1.4-0.6 % SOLN, Place 1-2 drops into both eyes 3 (three) times daily as needed (dry/irritated eyes.)., Disp: , Rfl:    prednisoLONE  acetate (PRED FORTE ) 1 % ophthalmic suspension, Place 1 drop into both eyes in the morning, at noon, and at bedtime. Place 2 Drops in Left Eye, Place 3 Drops In Right Eye TID, Disp: , Rfl:    pregabalin  (LYRICA ) 75 MG capsule, TAKE 1 CAPSULE(75 MG) BY MOUTH TWICE DAILY, Disp: 180 capsule, Rfl: 1   Allergies  Allergen Reactions   Crestor [Rosuvastatin Calcium ] Other (See Comments)    muscle aches   Demerol  [Meperidine Hcl]     Other reaction(s): Hallucinations   Shellfish  Allergy Itching and Other (See Comments)    Crab, shrimp and lobster ---lips itch and tingle Was told not to eat again after having a allergy test. Lobster, crab and shrimp      Review of Systems   There were no vitals filed for this visit. There is no height or weight on file to calculate BMI.  Wt Readings  from Last 3 Encounters:  12/07/23 160 lb (72.6 kg)  11/26/23 161 lb 9.6 oz (73.3 kg)  09/24/23 163 lb (73.9 kg)    The ASCVD Risk score (Arnett DK, et al., 2019) failed to calculate for the following reasons:   Risk score cannot be calculated because patient has a medical history suggesting prior/existing ASCVD  Objective:  Physical Exam      Assessment And Plan:   Assessment & Plan   No orders of the defined types were placed in this encounter.    No follow-ups on file.  Patient was given opportunity to ask questions. Patient verbalized understanding of the plan and was able to repeat key elements of the plan. All questions were answered to their satisfaction.    I, [name], MD, have reviewed all documentation for this visit. The documentation on 12/22/23 for the exam, diagnosis, procedures, and orders are all accurate and complete.   IF YOU HAVE BEEN REFERRED TO A SPECIALIST, IT MAY TAKE 1-2 WEEKS TO SCHEDULE/PROCESS THE REFERRAL. IF YOU HAVE NOT HEARD FROM US /SPECIALIST IN TWO WEEKS, PLEASE GIVE US  A CALL AT [phone].

## 2023-12-22 NOTE — Progress Notes (Signed)
 LILLETTE Kristeen JINNY Gladis, CMA,acting as a scribe for [name], MD.,have documented all relevant documentation on the behalf of [name], MD,as directed by  [name], MD while in the presence of [name], MD.  Subjective:  Patient ID: Sheila Reynolds , female    DOB: March 10, 1945 , 78 y.o.   MRN: [id number]  Chief Complaint  Patient presents with   Urinary Frequency    Patient presents today for urinary frequency and dysuria. She reports her symptoms started last week.      HPI Discussed the use of AI scribe software for clinical note transcription with the patient, who gave verbal consent to proceed.  History of Present Illness Sheila Reynolds is a 78 year old female who presents with symptoms of a urinary tract infection (UTI).  She has been experiencing urinary frequency and dysuria for a few weeks. These symptoms initially improved with a course of antibiotics prescribed after an outpatient biopsy around December 07, 2023.  After completing the antibiotic course, her symptoms returned.   Past Medical History:  Diagnosis Date   Anemia    3 months ago anemic   Anxiety    on meds   Arthritis    all over (01/15/2017)   Asthma    Bronchitis with emphysema    Chest pain    Chronic bronchitis (HCC)    Coronary artery disease    a. 01/2017 she underwent orbital atherectomy/DES to the proxmal LAD and PTCA to ostial D2. 2D Echo 01/15/17 showed mild LVH, EF 60-65%, grade 1 DD.   Family history of anesthesia complication    daughter N/V   Fibromyalgia    GERD (gastroesophageal reflux disease)    on meds   Glaucoma    Heart murmur    History of hiatal hernia    Hx of echocardiogram    Echo (03/2013):  Tech limited; Mild focal basal septal hypertrophy, EF 60-65%, normal RVF   Hyperlipidemia    Hypertension    Nausea vomiting and diarrhea 08/24/2023   Nonsustained ventricular tachycardia (HCC)    OSA on CPAP    Premature atrial contractions    PVC  (premature ventricular contraction)    a. Holter 12/16: NSR, occ PAC,PVCs   Sarcoidosis    TIA (transient ischemic attack)    Type II diabetes mellitus (HCC)      Family History  Problem Relation Age of Onset   Heart disease Father    Diabetes Father    Glaucoma Father    Cancer Mother        unknown type, ?lung   Heart disease Paternal Grandmother    Cancer Paternal Grandmother        unknown type   Diabetes Brother    Glaucoma Brother      Current Outpatient Medications:    acetaminophen  (TYLENOL ) 500 MG tablet, Take 1,000 mg by mouth 2 (two) times daily as needed for moderate pain or headache., Disp: , Rfl:    albuterol  (PROVENTIL ) (2.5 MG/3ML) 0.083% nebulizer solution, Take 3 mLs (2.5 mg total) by nebulization daily as needed for wheezing or shortness of breath., Disp: 75 mL, Rfl: 5   albuterol  (VENTOLIN  HFA) 108 (90 Base) MCG/ACT inhaler, Inhale 2 puffs into the lungs every 6 (six) hours as needed for wheezing or shortness of breath., Disp: 18 g, Rfl: 12   amoxicillin -clavulanate (AUGMENTIN ) 875-125 MG tablet, Take 1 tablet by mouth 2 (two) times daily for 7 days., Disp: 14 tablet,  Rfl: 0   aspirin  EC 81 MG tablet, Take 81 mg by mouth daily. Swallow whole., Disp: , Rfl:    atorvastatin  (LIPITOR) 80 MG tablet, Take 1 tablet (80 mg total) by mouth daily., Disp: 90 tablet, Rfl: 3   azaTHIOprine  (IMURAN ) 50 MG tablet, Take 1 tablet by mouth daily., Disp: , Rfl:    benzonatate  (TESSALON  PERLES) 100 MG capsule, Take 1-2 tablet as needed by mouth three times per day for cough. (Patient not taking: Reported on 12/24/2023), Disp: 30 capsule, Rfl: 1   Blood Glucose Monitoring Suppl (ACCU-CHEK AVIVA PLUS) w/Device KIT, Use to check blood sugars 3 times a day. Dx code e11.65, Disp: 1 kit, Rfl: 3   Blood Glucose Monitoring Suppl (ACCU-CHEK GUIDE) w/Device KIT, USE AS NEEDED TO CHECK BLOOD SUGARS., Disp: 1 kit, Rfl: 0   brimonidine  (ALPHAGAN  P) 0.1 % SOLN, Place 1 drop into both eyes in the  morning, at noon, and at bedtime., Disp: , Rfl:    brinzolamide  (AZOPT ) 1 % ophthalmic suspension, Place 1 drop into both eyes 3 (three) times daily., Disp: , Rfl:    carvedilol  (COREG ) 12.5 MG tablet, Take 1 tablet (12.5 mg total) by mouth 2 (two) times daily with a meal., Disp: 180 tablet, Rfl: 3   doxycycline  (VIBRA -TABS) 100 MG tablet, Take 1 tablet (100 mg total) by mouth 2 (two) times daily. (Patient not taking: Reported on 12/24/2023), Disp: 14 tablet, Rfl: 0   EPINEPHrine  0.3 mg/0.3 mL IJ SOAJ injection, Inject 0.3 mg into the muscle as needed for anaphylaxis., Disp: 1 each, Rfl: 0   ezetimibe  (ZETIA ) 10 MG tablet, Take 1 tablet (10 mg total) by mouth daily. (Patient taking differently: Take 10 mg by mouth at bedtime.), Disp: 90 tablet, Rfl: 1   fluticasone  (FLONASE) 50 MCG/ACT nasal spray, Place 2 sprays into both nostrils daily., Disp: 18.2 mL, Rfl: 2   glucose blood (ACCU-CHEK GUIDE TEST) test strip, Use as instructed to check blood sugars E11.9, Disp: 200 each, Rfl: 1   guaiFENesin  (MUCINEX ) 600 MG 12 hr tablet, Take 1 tablet (600 mg total) by mouth 2 (two) times daily. (Patient taking differently: Take 600 mg by mouth daily.), Disp: 60 tablet, Rfl: 1   isosorbide  mononitrate (IMDUR ) 60 MG 24 hr tablet, TAKE 1 TABLET(60 MG) BY MOUTH DAILY, Disp: 90 tablet, Rfl: 3   JANUMET  50-500 MG tablet, TAKE 1 TABLET BY MOUTH TWICE DAILY WITH A MEAL, Disp: 90 tablet, Rfl: 1   ketorolac  (ACULAR ) 0.5 % ophthalmic solution, Place 1 drop into the right eye 2 (two) times daily., Disp: , Rfl:    Lancets (ONETOUCH DELICA PLUS LANCET33G) MISC, CHECK BLOOD SUGAR BEFORE BREAKFAST AND DINNER, Disp: 100 each, Rfl: 1   montelukast  (SINGULAIR ) 10 MG tablet, Take 1 tablet (10 mg total) by mouth daily., Disp: 90 tablet, Rfl: 1   nitroGLYCERIN  (NITROSTAT ) 0.4 MG SL tablet, Place 1 tablet (0.4 mg total) under the tongue every 5 (five) minutes as needed for chest pain., Disp: 25 tablet, Rfl: prn   pantoprazole  (PROTONIX )  40 MG tablet, Take 1 tablet (40 mg total) by mouth daily., Disp: 90 tablet, Rfl: 3   Polyvinyl Alcohol -Povidone PF (REFRESH) 1.4-0.6 % SOLN, Place 1-2 drops into both eyes 3 (three) times daily as needed (dry/irritated eyes.)., Disp: , Rfl:    prednisoLONE  acetate (PRED FORTE ) 1 % ophthalmic suspension, Place 1 drop into both eyes in the morning, at noon, and at bedtime. Place 2 Drops in Left Eye, Place 3 Drops In  Right Eye TID, Disp: , Rfl:    predniSONE  (DELTASONE ) 20 MG tablet, Take 2 tablets (40 mg total) by mouth daily with breakfast for 5 days., Disp: 10 tablet, Rfl: 0   pregabalin  (LYRICA ) 75 MG capsule, TAKE 1 CAPSULE(75 MG) BY MOUTH TWICE DAILY, Disp: 180 capsule, Rfl: 1   Allergies  Allergen Reactions   Crestor [Rosuvastatin Calcium ] Other (See Comments)    muscle aches   Demerol  [Meperidine Hcl]     Other reaction(s): Hallucinations   Shellfish Allergy Itching and Other (See Comments)    Crab, shrimp and lobster ---lips itch and tingle Was told not to eat again after having a allergy test. Lobster, crab and shrimp      Review of Systems  Constitutional: Negative.   HENT: Negative.    Respiratory: Negative.    Cardiovascular: Negative.   Gastrointestinal: Negative.   Genitourinary:  Positive for dysuria.  Neurological: Negative.   Psychiatric/Behavioral: Negative.       Today's Vitals   12/22/23 1447  BP: 130/80  Pulse: 85  Temp: 98.5 F (36.9 C)  TempSrc: Oral  Weight: 160 lb (72.6 kg)  Height: 5' 2 (1.575 m)  PainSc: 4   PainLoc: Vagina   Body mass index is 29.26 kg/m.  Wt Readings from Last 3 Encounters:  12/24/23 164 lb 9.6 oz (74.7 kg)  12/22/23 160 lb (72.6 kg)  12/07/23 160 lb (72.6 kg)    The ASCVD Risk score (Arnett DK, et al., 2019) failed to calculate for the following reasons:   Risk score cannot be calculated because patient has a medical history suggesting prior/existing ASCVD  Objective:  Physical Exam Vitals and nursing note reviewed.   Constitutional:      Appearance: Normal appearance.  HENT:     Head: Normocephalic and atraumatic.  Eyes:     Extraocular Movements: Extraocular movements intact.  Cardiovascular:     Rate and Rhythm: Normal rate and regular rhythm.     Heart sounds: Normal heart sounds.  Pulmonary:     Effort: Pulmonary effort is normal.     Breath sounds: Normal breath sounds.  Abdominal:     Comments: Suprapubic tenderness to palpation  Musculoskeletal:     Cervical back: Normal range of motion.  Skin:    General: Skin is warm.  Neurological:     General: No focal deficit present.     Mental Status: She is alert.  Psychiatric:        Mood and Affect: Mood normal.        Behavior: Behavior normal.         Assessment And Plan:   Assessment & Plan Acute cystitis without hematuria Recurrent UTI with symptoms of frequency, dysuria, and pain. Initial improvement with doxycycline , symptoms recurred post-treatment. No penicillin allergy. - Sent urine culture for antibiotic sensitivity. - Administered Rocephin  injection. - Prescribed oral antibiotics for 3 to 5 days based on culture results. BMI 29.0-29.9,adult Her BMI is acceptable for her demographic. Encouraged to perform chair exercises while watching TV.   Orders Placed This Encounter  Procedures   Culture, Urine   POCT URINALYSIS DIP (CLINITEK)   Return if symptoms worsen or fail to improve.  Patient was given opportunity to ask questions. Patient verbalized understanding of the plan and was able to repeat key elements of the plan. All questions were answered to their satisfaction.   I, [name], MD, have reviewed all documentation for this visit. The documentation on 12/26/23 for the exam,  diagnosis, procedures, and orders are all accurate and complete.   IF YOU HAVE BEEN REFERRED TO A SPECIALIST, IT MAY TAKE 1-2 WEEKS TO SCHEDULE/PROCESS THE REFERRAL. IF YOU HAVE NOT HEARD FROM US /SPECIALIST IN TWO WEEKS, PLEASE GIVE US  A  CALL AT [phone] X [phone].

## 2023-12-23 NOTE — Telephone Encounter (Signed)
 She should be offered appt  Called her to schedule for today and there was no answer- left detailed msg to call and make acute ov

## 2023-12-23 NOTE — Telephone Encounter (Unsigned)
 Copied from CRM [id number]. Topic: Clinical - Medical Advice >> Dec 22, 2023  9:06 AM Isabell A wrote: Reason for CRM: Patient is requesting to speak with Dr.Byrums nurse - states she has a procedure coming up and her infection has not cleared.   Callback number: [phone]

## 2023-12-23 NOTE — Telephone Encounter (Signed)
 I called and spoke with patient, she is scheduled for a Bronch with Dr. Fayetta on 11/17 and she is concerned that she still has an infection and may not need to have it done.  I scheduled her to see Izetta on 11/13 at 10:30 am, advised to arrive by 10:15 am for check in and if the procedure needs to be rescheduled, Izetta will communicate that with Dr. Shelah and our office with have it rescheduled.  I assured her that her well being is our main priority.  She verbalized understanding.  Nothing further needed.

## 2023-12-23 NOTE — Telephone Encounter (Signed)
 Copied from CRM [id number]. Topic: Clinical - Medical Advice >> Dec 22, 2023  4:55 PM Rilla B wrote: Reason for CRM:  Patient is calling in requesting to speak with Dr Lanny nurse. Patient states she has an upcoming procedure with Dr Shelah and her infection is not clearing up. She states she called this morning and no one has returned her call.  Called CAL. No answer (x3)  Please call patient [phone]  Pt was scheduled for acute visit 12/24/23

## 2023-12-23 NOTE — Telephone Encounter (Signed)
 Acute visit scheduled for 12/24/23.

## 2023-12-24 ENCOUNTER — Ambulatory Visit (INDEPENDENT_AMBULATORY_CARE_PROVIDER_SITE_OTHER): Admitting: Nurse Practitioner

## 2023-12-24 ENCOUNTER — Telehealth: Payer: Self-pay | Admitting: Nurse Practitioner

## 2023-12-24 ENCOUNTER — Encounter: Payer: Self-pay | Admitting: Emergency Medicine

## 2023-12-24 ENCOUNTER — Encounter: Payer: Self-pay | Admitting: Nurse Practitioner

## 2023-12-24 ENCOUNTER — Telehealth: Payer: Self-pay

## 2023-12-24 VITALS — BP 130/84 | HR 76 | Temp 97.6°F | Ht 62.0 in | Wt 164.6 lb

## 2023-12-24 DIAGNOSIS — D869 Sarcoidosis, unspecified: Secondary | ICD-10-CM

## 2023-12-24 DIAGNOSIS — J019 Acute sinusitis, unspecified: Secondary | ICD-10-CM | POA: Diagnosis not present

## 2023-12-24 DIAGNOSIS — R918 Other nonspecific abnormal finding of lung field: Secondary | ICD-10-CM | POA: Diagnosis not present

## 2023-12-24 DIAGNOSIS — R0609 Other forms of dyspnea: Secondary | ICD-10-CM

## 2023-12-24 DIAGNOSIS — R600 Localized edema: Secondary | ICD-10-CM | POA: Diagnosis not present

## 2023-12-24 DIAGNOSIS — J209 Acute bronchitis, unspecified: Secondary | ICD-10-CM | POA: Diagnosis not present

## 2023-12-24 DIAGNOSIS — I890 Lymphedema, not elsewhere classified: Secondary | ICD-10-CM | POA: Diagnosis not present

## 2023-12-24 LAB — BASIC METABOLIC PANEL WITH GFR
BUN: 11 mg/dL (ref 6–23)
CO2: 28 meq/L (ref 19–32)
Calcium: 9.5 mg/dL (ref 8.4–10.5)
Chloride: 103 meq/L (ref 96–112)
Creatinine, Ser: 0.86 mg/dL (ref 0.40–1.20)
GFR: 65.6 mL/min (ref >60.00)
Glucose, Bld: 91 mg/dL (ref 70–99)
Potassium: 4.8 meq/L (ref 3.5–5.1)
Sodium: 138 meq/L (ref 135–145)

## 2023-12-24 LAB — BRAIN NATRIURETIC PEPTIDE: Pro B Natriuretic peptide (BNP): 46 pg/mL (ref 0.0–100.0)

## 2023-12-24 MED ORDER — FLUTICASONE PROPIONATE 50 MCG/ACT NA SUSP
2.0000 | Freq: Every day | NASAL | 2 refills | Status: AC
Start: 2023-12-24 — End: ?

## 2023-12-24 MED ORDER — AMOXICILLIN-POT CLAVULANATE 875-125 MG PO TABS: 1.0000 | ORAL_TABLET | Freq: Two times a day (BID) | ORAL | 0 refills | Status: DC

## 2023-12-24 MED ORDER — PREDNISONE 20 MG PO TABS: 40.0000 mg | ORAL_TABLET | Freq: Every day | ORAL | 0 refills | Status: AC

## 2023-12-24 MED ORDER — ALBUTEROL SULFATE (2.5 MG/3ML) 0.083% IN NEBU: 2.5000 mg | INHALATION_SOLUTION | Freq: Every day | RESPIRATORY_TRACT | 5 refills | Status: DC | PRN

## 2023-12-24 NOTE — H&P (View-Only) (Signed)
 @Patient  ID: Sheila Reynolds, female    DOB: 1945/10/06, 78 y.o.   MRN: [id number]  Chief Complaint  Patient presents with   Cough    Wet cough- yellow/ clear mucous. A little better  Pt was told she had a URI. Pt also has an upcoming Bronchoscopy.  Denies fever but had some chest pain up the side on her left side.    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 She is a patient of Dr. Lanny and also seen by Dr. Geronimo in the past. Past medical history significant for PVCs, PVD, HTN, HF, CAD, GERD, DM, HLD, epilepsy, DDD, fibromyalgia, open angle glaucoma.   TEST/EVENTS:  06/29/2023 PFT: FVC 65, FEV1 64, ratio 73, DLCO 51 08/11/2023 echo: EF 55-60%. RV size and function normal.  12/04/2023 Super D CT chest: focal area of architectural distortion with nodular thickening and mixed solid/cystic in right apex with decreased nodularity, favoring inflammatory change. Stable RUL nodularity with traction btx consistent with fibrosis, stable. Mild emphysema. Atherosclerosis.   11/26/2023: OV with Dr. Shelah. Waxing and waning nodules. Suspicion for malignancy is lower given waxing and waning nature. Would be benefit to get tissue diagnosis to rule out malignancy and possibly rule in inflammatory process like sarcoid. Reviewed risks/benefits and shared decision to move forward with bronchoscopy. Repeat CT ordered. 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   12/24/2023: Today - acute Discussed the use of AI scribe software for clinical note transcription with the patient, who gave verbal consent to proceed.  History of Present Illness Sheila Reynolds is a 78 year old female who presents with persistent cough and mucus production. She is accompanied by  her daughter.  She had a bronchoscopy scheduled last month, which was canceled due to upper respiratory symptoms identified by the anesthesiologist. She was treated with doxycycline , which initially improved her symptoms, but she continues to experience coughing and mucus production with yellow to clear phlegm. She did get a penicillin shot from her PCP as well, around the same time, for a UTI.   Despite this, she continues to cough up mucus and experiences occasional wheezing, particularly after prolonged coughing episodes. She describes the wheezing as feeling like it 'takes my breath away.' Some increased shortness of breath with exertion. Denies any fevers, hemoptysis, weight loss, night sweats. Eating and drinking well. No calf pain/tenderness, redness, warmth.   She uses an albuterol  inhaler a few times a week. She has not been using any over-the-counter cough medications like Mucinex . She reports some sinus symptoms, including drainage in the back of her throat, which may be contributing to her cough. No sinus tenderness, headaches, sore throat, ear pain.   She has a history of lymphedema, which has caused chronic leg swelling. She is not currently taking any medication for the swelling. She does not feel like it's any worse than her usual but she does have significant swelling. No weight gain.   In terms of her respiratory symptoms, she had a repeat CT scan a couple of weeks ago, which did not show any significant findings such as pneumonia. She is supposed to have a bronchoscopy on 11/17 for multiple lung nodules. She would like to postpone until she is feeling  better and off medications.   No low oxygen  levels, CP, palpitations, lightheaded/dizziness.     Allergies  Allergen Reactions   Crestor [Rosuvastatin Calcium ] Other (See Comments)    muscle aches   Demerol  [Meperidine Hcl]     Other reaction(s): Hallucinations   Shellfish Allergy Itching and Other (See Comments)    Crab,  shrimp and lobster ---lips itch and tingle Was told not to eat again after having a allergy test. Lobster, crab and shrimp     Immunization History  Administered Date(s) Administered   Fluad Quad(high Dose 65+) 12/30/2019, 11/08/2020, 10/21/2021   INFLUENZA, HIGH DOSE SEASONAL PF 11/18/2016, 12/21/2017, 11/23/2018   Influenza Split 10/31/2013   Influenza Whole 12/23/2017   Influenza,inj,Quad PF,6+ Mos 02/25/2013, 03/19/2015   Influenza,inj,quad, With Preservative 11/11/2018   Influenza-Unspecified 11/11/2022   PFIZER(Purple Top)SARS-COV-2 Vaccination 03/01/2019, 03/22/2019, 11/22/2019   PNEUMOCOCCAL CONJUGATE-20 10/07/2022   Pfizer Covid-19 Vaccine Bivalent Booster 48yrs & up 12/28/2020   Pneumococcal Polysaccharide-23 12/21/2017   Tdap 02/25/2022   Zoster Recombinant(Shingrix ) 12/25/2017, 06/13/2021    Past Medical History:  Diagnosis Date   Anemia    3 months ago anemic   Anxiety    on meds   Arthritis    all over (01/15/2017)   Asthma    Bronchitis with emphysema    Chest pain    Chronic bronchitis (HCC)    Coronary artery disease    a. 01/2017 she underwent orbital atherectomy/DES to the proxmal LAD and PTCA to ostial D2. 2D Echo 01/15/17 showed mild LVH, EF 60-65%, grade 1 DD.   Family history of anesthesia complication    daughter N/V   Fibromyalgia    GERD (gastroesophageal reflux disease)    on meds   Glaucoma    Heart murmur    History of hiatal hernia    Hx of echocardiogram    Echo (03/2013):  Tech limited; Mild focal basal septal hypertrophy, EF 60-65%, normal RVF   Hyperlipidemia    Hypertension    Nausea vomiting and diarrhea 08/24/2023   Nonsustained ventricular tachycardia (HCC)    OSA on CPAP    Premature atrial contractions    PVC (premature ventricular contraction)    a. Holter 12/16: NSR, occ PAC,PVCs   Sarcoidosis    TIA (transient ischemic attack)    Type II diabetes mellitus (HCC)     Tobacco History: Social History   Tobacco Use   Smoking Status Former   Current packs/day: 0.00   Average packs/day: 1 pack/day for 4.0 years (4.0 ttl pk-yrs)   Types: Cigarettes   Start date: 02/11/1976   Quit date: 02/11/1980   Years since quitting: 43.8  Smokeless Tobacco Never   Counseling given: Not Answered   Outpatient Medications Prior to Visit  Medication Sig Dispense Refill   acetaminophen  (TYLENOL ) 500 MG tablet Take 1,000 mg by mouth 2 (two) times daily as needed for moderate pain or headache.     albuterol  (VENTOLIN  HFA) 108 (90 Base) MCG/ACT inhaler Inhale 2 puffs into the lungs every 6 (six) hours as needed for wheezing or shortness of breath. 18 g 12   aspirin  EC 81 MG tablet Take 81 mg by mouth daily. Swallow whole.     atorvastatin  (LIPITOR) 80 MG tablet Take 1 tablet (80 mg total) by mouth daily. 90 tablet 3   azaTHIOprine  (IMURAN ) 50 MG tablet Take 1 tablet by mouth daily.     Blood Glucose Monitoring Suppl (ACCU-CHEK AVIVA PLUS) w/Device KIT Use to check blood sugars  3 times a day. Dx code e11.65 1 kit 3   Blood Glucose Monitoring Suppl (ACCU-CHEK GUIDE) w/Device KIT USE AS NEEDED TO CHECK BLOOD SUGARS. 1 kit 0   brimonidine  (ALPHAGAN  P) 0.1 % SOLN Place 1 drop into both eyes in the morning, at noon, and at bedtime.     brinzolamide  (AZOPT ) 1 % ophthalmic suspension Place 1 drop into both eyes 3 (three) times daily.     carvedilol  (COREG ) 12.5 MG tablet Take 1 tablet (12.5 mg total) by mouth 2 (two) times daily with a meal. 180 tablet 3   EPINEPHrine  0.3 mg/0.3 mL IJ SOAJ injection Inject 0.3 mg into the muscle as needed for anaphylaxis. 1 each 0   ezetimibe  (ZETIA ) 10 MG tablet Take 1 tablet (10 mg total) by mouth daily. (Patient taking differently: Take 10 mg by mouth at bedtime.) 90 tablet 1   glucose blood (ACCU-CHEK GUIDE TEST) test strip Use as instructed to check blood sugars E11.9 200 each 1   guaiFENesin  (MUCINEX ) 600 MG 12 hr tablet Take 1 tablet (600 mg total) by mouth 2 (two) times daily. (Patient taking  differently: Take 600 mg by mouth daily.) 60 tablet 1   isosorbide  mononitrate (IMDUR ) 60 MG 24 hr tablet TAKE 1 TABLET(60 MG) BY MOUTH DAILY 90 tablet 3   JANUMET  50-500 MG tablet TAKE 1 TABLET BY MOUTH TWICE DAILY WITH A MEAL 90 tablet 1   ketorolac  (ACULAR ) 0.5 % ophthalmic solution Place 1 drop into the right eye 2 (two) times daily.     Lancets (ONETOUCH DELICA PLUS LANCET33G) MISC CHECK BLOOD SUGAR BEFORE BREAKFAST AND DINNER 100 each 1   montelukast  (SINGULAIR ) 10 MG tablet Take 1 tablet (10 mg total) by mouth daily. 90 tablet 1   nitroGLYCERIN  (NITROSTAT ) 0.4 MG SL tablet Place 1 tablet (0.4 mg total) under the tongue every 5 (five) minutes as needed for chest pain. 25 tablet prn   pantoprazole  (PROTONIX ) 40 MG tablet Take 1 tablet (40 mg total) by mouth daily. 90 tablet 3   Polyvinyl Alcohol -Povidone PF (REFRESH) 1.4-0.6 % SOLN Place 1-2 drops into both eyes 3 (three) times daily as needed (dry/irritated eyes.).     prednisoLONE  acetate (PRED FORTE ) 1 % ophthalmic suspension Place 1 drop into both eyes in the morning, at noon, and at bedtime. Place 2 Drops in Left Eye, Place 3 Drops In Right Eye TID     pregabalin  (LYRICA ) 75 MG capsule TAKE 1 CAPSULE(75 MG) BY MOUTH TWICE DAILY 180 capsule 1   albuterol  (PROVENTIL ) (2.5 MG/3ML) 0.083% nebulizer solution Take 3 mLs (2.5 mg total) by nebulization daily as needed for wheezing or shortness of breath. 75 mL 12   benzonatate  (TESSALON  PERLES) 100 MG capsule Take 1-2 tablet as needed by mouth three times per day for cough. (Patient not taking: Reported on 12/24/2023) 30 capsule 1   doxycycline  (VIBRA -TABS) 100 MG tablet Take 1 tablet (100 mg total) by mouth 2 (two) times daily. (Patient not taking: Reported on 12/24/2023) 14 tablet 0   amoxicillin -clavulanate (AUGMENTIN ) 875-125 MG tablet Take 1 tablet by mouth 2 (two) times daily. (Patient not taking: Reported on 12/24/2023) 20 tablet 0   No facility-administered medications prior to visit.      Review of Systems: as above    Physical Exam:  BP 130/84   Pulse 76   Temp 97.6 F (36.4 C)   Ht 5' 2 (1.575 m)   Wt 164 lb 9.6 oz (74.7 kg)   SpO2 97%  Comment: ra  BMI 30.11 kg/m   GEN: Pleasant, interactive, well-appearing; in no acute distress HEENT:  Normocephalic and atraumatic. PERRLA. Sclera white. Nasal turbinates erythematous, moist and patent bilaterally. Clear rhinorrhea present. Oropharynx pink and moist, without exudate or edema. No lesions, ulcerations NECK:  Supple w/ fair ROM. No JVD present. No lymphadenopathy.   CV: RRR, no m/r/g, +2-3 pitting BLE edema. Pulses intact, +2 bilaterally. No cyanosis, pallor or clubbing. PULMONARY:  Unlabored, regular breathing. Diminished bilaterally A&P with inspiratory wheeze R>L. No accessory muscle use.  GI: BS present and normoactive. Soft, non-tender to palpation.  MSK: No erythema, warmth or tenderness. Cap refil <2 sec all extrem. No deformities or joint swelling noted.  Neuro: A/Ox3. No focal deficits noted.   Skin: Warm, no lesions or rashe Psych: Normal affect and behavior. Judgement and thought content appropriate.     Lab Results:  CBC    Component Value Date/Time   WBC 5.1 12/08/2023 0921   RBC 3.96 12/08/2023 0921   HGB 10.8 (L) 12/08/2023 0921   HGB 11.7 08/17/2023 1401   HCT 34.3 (L) 12/08/2023 0921   HCT 37.4 08/17/2023 1401   PLT 188 12/08/2023 0921   PLT 243 08/17/2023 1401   MCV 86.6 12/08/2023 0921   MCV 90 08/17/2023 1401   MCH 27.3 12/08/2023 0921   MCHC 31.5 12/08/2023 0921   RDW 14.0 12/08/2023 0921   RDW 13.8 08/17/2023 1401   LYMPHSABS 0.4 (L) 08/09/2023 0812   MONOABS 1.1 (H) 08/09/2023 0812   EOSABS 0.2 08/09/2023 0812   BASOSABS 0.0 08/09/2023 0812    BMET    Component Value Date/Time   NA 137 12/08/2023 0921   NA 141 08/17/2023 1401   K 3.9 12/08/2023 0921   CL 106 12/08/2023 0921   CO2 22 12/08/2023 0921   GLUCOSE 128 (H) 12/08/2023 0921   BUN 14 12/08/2023 0921    BUN 11 08/17/2023 1401   CREATININE 0.83 12/08/2023 0921   CREATININE 0.73 03/16/2018 1501   CALCIUM  8.7 (L) 12/08/2023 0921   GFRNONAA >60 12/08/2023 0921   GFRNONAA 83 03/16/2018 1501   GFRAA 91 03/28/2020 1212   GFRAA 97 03/16/2018 1501    BNP    Component Value Date/Time   BNP 52.3 12/02/2022 2123   BNP 78.3 01/11/2015 1622     Imaging:  CT SUPER D CHEST WO CONTRAST Result Date: 12/07/2023 EXAM: CT CHEST WITHOUT CONTRAST 12/04/2023 11:55:30 AM TECHNIQUE: CT of the chest was performed without the administration of intravenous contrast. Multiplanar reformatted images are provided for review. Automated exposure control, iterative reconstruction, and/or weight based adjustment of the mA/kV was utilized to reduce the radiation dose to as low as reasonably achievable. COMPARISON: Comparison is made to 10/30/2023. CLINICAL HISTORY: . FINDINGS: MEDIASTINUM: Left anterior descending coronary artery stenting noted. Moderate multivessel coronary artery calcification. Global cardiac size within normal limits. No pericardial effusion. Central pulmonary arteries are of normal caliber. Mild atherosclerotic calcification within the thoracic aorta. No aortic aneurysm. The central airways are clear. LYMPH NODES: No mediastinal, hilar or axillary lymphadenopathy. LUNGS AND PLEURA: The focal area of architectural distortion with nodular septal thickening and mixed solid and cystic composition appears stable in overall size measuring 17 x 38 mm at image 24 / 4 but demonstrates significantly decreased nodularity when compared to prior examination. In combination with additional peribronchovascular nodularity within the surrounding right apex, the findings are most in keeping with evolving inflammatory change, possibly related to the patient's underlying sarcoidosis. Additional areas  of peribronchial nodularity within the right upper lobe with associated traction bronchiolectasis in keeping with some degree of  fibrosis is stable. Bibasilar subpleural pulmonary fibrotic change is stable. Mild emphysema. No new focal pulmonary infiltrates or nodularity is identified to suggest active inflammation. No pulmonary edema. No pleural effusion or pneumothorax. Central airways are widely patent. SOFT TISSUES/BONES: Bilateral breast implants noted. No acute abnormality of the bones or soft tissues. UPPER ABDOMEN: Limited images of the upper abdomen demonstrates no acute abnormality. IMPRESSION: 1. Similarly sized focal area of architectural distortion with nodular septal thickening and mixed solid/cystic composition in the right apex with significantly decreased nodularity, favor evolving inflammatory change related to sarcoidosis; no new suspicious pulmonary nodules or acute infiltrates identified. 2. Stable right upper lobe peribronchial nodularity with traction bronchiolectasis consistent with fibrosis; stable bibasilar subpleural fibrotic change. 3. Mild emphysema (icd-10 j43.9). Low-dose CT lung cancer screening is recommended for patients who are 64-60 years of age with a 20+ pack-year history of smoking and who are currently smoking or quit 15 years ago 4. raf score: emphysema (icd10-j43.9), Aortic atherosclerosis (icd10-i70.0) Electronically signed by: Dorethia Molt MD 12/07/2023 11:20 PM EDT RP Workstation: [id number]    cefTRIAXone  (ROCEPHIN ) injection 500 mg     Date Action Dose Route User   12/22/2023 1449 Given 500 mg Intramuscular (Left Ventrogluteal) Gladis Kristeen PARAS, CMA          Latest Ref Rng & Units 06/29/2023    8:59 AM 01/05/2023   10:31 AM 11/27/2021    1:16 PM 04/13/2020   10:06 AM 11/25/2016    4:11 PM  PFT Results  FVC-Pre L 1.70  1.49  1.67  1.66  1.70   FVC-Predicted Pre % 65  57  63  79  78   FVC-Post L  1.53  1.60  1.59  1.69   FVC-Predicted Post %  59  61  76  78   Pre FEV1/FVC % % 73  81  77  77  81   Post FEV1/FCV % %  79  80  81  83   FEV1-Pre L 1.24  1.20  1.29  1.27  1.39    FEV1-Predicted Pre % 64  62  66  79  82   FEV1-Post L  1.21  1.28  1.28  1.40   DLCO uncorrected ml/min/mmHg 9.11  10.23  9.64  9.27  9.95   DLCO UNC% % 51  57  53  51  46   DLCO corrected ml/min/mmHg  10.72  10.38  9.27  10.18   DLCO COR %Predicted %  60  57  51  47   DLVA Predicted % 79  100  87  80  88   TLC L  2.89  3.24  3.52  3.29   TLC % Predicted %  60  68  73  69   RV % Predicted %  57  71  85  75     No results found for: NITRICOXIDE      Assessment & Plan:   No problem-specific Assessment & Plan notes found for this encounter. Assessment and Plan Assessment & Plan Acute bronchitis Sarcoid Acute sinusitis  Slow to resolve acute bronchitic illness/sarcoid flare with cough and upper respiratory symptoms. Mild bronchospasm on exam. Some improvement with prior [facility] shot/doxycycline  course. Will treat her with full augmentin  course x 7 days and empiric prednisone  burst. Mucociliary clearance therapies encouraged. Target sinus symptoms as this is likely contributing to DOE, cough.  No significant findings on recent CT scan. She does also have significant BLE edema, which she feels is at baseline related to lymphedema. Given prolonged symptoms, will check BNP to rule out potential cardiac component/volume overload. Action plan in place.  - Prescribed Augmentin  for 7 days to address potential respiratory infection. - Prescribed prednisone  40 mg orally for 5 days to reduce inflammation. - Advised use of albuterol  inhaler as needed. - Recommended Mucinex  to thin mucus. - Initiated Flonase nasal spray until symptoms resolve, then use as needed.  Lung nodules Will postpone bronchoscopy as it would be difficult to reassess her prior to Monday to ensure she is improved. Will send message to scheduling pool and Dr. Shelah.  - Coordinate with Dr. Shelah regarding bronchoscopy scheduling based on symptom resolution.  Lymphedema and lower extremity edema Chronic lymphedema with lower  extremity edema. See above - Ordered lab work to assess cardiac function and fluid status. - Will consider diuretics if lab results indicate fluid overload.    Advised if symptoms do not improve or worsen, to please contact office for sooner follow up or seek emergency care.   I spent 45 minutes of dedicated to the care of this patient on the date of this encounter to include pre-visit review of records, face-to-face time with the patient discussing conditions above, post visit ordering of testing, clinical documentation with the electronic health record, making appropriate referrals as documented, and communicating necessary findings to members of the patients care team.  Comer LULLA Rouleau, NP 12/24/2023  Pt aware and understands NP's role.

## 2023-12-24 NOTE — Patient Instructions (Signed)
 Continue Albuterol  inhaler 2 puffs or 3 mL neb every 6 hours as needed for shortness of breath or wheezing. Notify if symptoms persist despite rescue inhaler/neb use.  Use guaifenesin  600 mg Twice daily for cough/congestion Flonase nasal spray 2 sprays each nostril daily for sinus drainage Augmentin  1 tab Twice daily for 7 days. Take with food Prednisone  40 mg daily for 5 days. Take in AM with food   I will have someone call you to reschedule the bronchoscopy  Labs today   Follow up after your bronchoscopy to review results with Dr. Shelah. If symptoms do not improve or worsen, please contact office for sooner follow up or seek emergency care.

## 2023-12-24 NOTE — Progress Notes (Signed)
 @Patient  ID: Sheila Reynolds, female    DOB: 1945/10/06, 78 y.o.   MRN: [id number]  Chief Complaint  Patient presents with   Cough    Wet cough- yellow/ clear mucous. A little better  Pt was told she had a URI. Pt also has an upcoming Bronchoscopy.  Denies fever but had some chest pain up the side on her left side.    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 She is a patient of Dr. Lanny and also seen by Dr. Geronimo in the past. Past medical history significant for PVCs, PVD, HTN, HF, CAD, GERD, DM, HLD, epilepsy, DDD, fibromyalgia, open angle glaucoma.   TEST/EVENTS:  06/29/2023 PFT: FVC 65, FEV1 64, ratio 73, DLCO 51 08/11/2023 echo: EF 55-60%. RV size and function normal.  12/04/2023 Super D CT chest: focal area of architectural distortion with nodular thickening and mixed solid/cystic in right apex with decreased nodularity, favoring inflammatory change. Stable RUL nodularity with traction btx consistent with fibrosis, stable. Mild emphysema. Atherosclerosis.   11/26/2023: OV with Dr. Shelah. Waxing and waning nodules. Suspicion for malignancy is lower given waxing and waning nature. Would be benefit to get tissue diagnosis to rule out malignancy and possibly rule in inflammatory process like sarcoid. Reviewed risks/benefits and shared decision to move forward with bronchoscopy. Repeat CT ordered. 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   12/24/2023: Today - acute Discussed the use of AI scribe software for clinical note transcription with the patient, who gave verbal consent to proceed.  History of Present Illness Sheila Reynolds is a 78 year old female who presents with persistent cough and mucus production. She is accompanied by  her daughter.  She had a bronchoscopy scheduled last month, which was canceled due to upper respiratory symptoms identified by the anesthesiologist. She was treated with doxycycline , which initially improved her symptoms, but she continues to experience coughing and mucus production with yellow to clear phlegm. She did get a penicillin shot from her PCP as well, around the same time, for a UTI.   Despite this, she continues to cough up mucus and experiences occasional wheezing, particularly after prolonged coughing episodes. She describes the wheezing as feeling like it 'takes my breath away.' Some increased shortness of breath with exertion. Denies any fevers, hemoptysis, weight loss, night sweats. Eating and drinking well. No calf pain/tenderness, redness, warmth.   She uses an albuterol  inhaler a few times a week. She has not been using any over-the-counter cough medications like Mucinex . She reports some sinus symptoms, including drainage in the back of her throat, which may be contributing to her cough. No sinus tenderness, headaches, sore throat, ear pain.   She has a history of lymphedema, which has caused chronic leg swelling. She is not currently taking any medication for the swelling. She does not feel like it's any worse than her usual but she does have significant swelling. No weight gain.   In terms of her respiratory symptoms, she had a repeat CT scan a couple of weeks ago, which did not show any significant findings such as pneumonia. She is supposed to have a bronchoscopy on 11/17 for multiple lung nodules. She would like to postpone until she is feeling  better and off medications.   No low oxygen  levels, CP, palpitations, lightheaded/dizziness.     Allergies  Allergen Reactions   Crestor [Rosuvastatin Calcium ] Other (See Comments)    muscle aches   Demerol  [Meperidine Hcl]     Other reaction(s): Hallucinations   Shellfish Allergy Itching and Other (See Comments)    Crab,  shrimp and lobster ---lips itch and tingle Was told not to eat again after having a allergy test. Lobster, crab and shrimp     Immunization History  Administered Date(s) Administered   Fluad Quad(high Dose 65+) 12/30/2019, 11/08/2020, 10/21/2021   INFLUENZA, HIGH DOSE SEASONAL PF 11/18/2016, 12/21/2017, 11/23/2018   Influenza Split 10/31/2013   Influenza Whole 12/23/2017   Influenza,inj,Quad PF,6+ Mos 02/25/2013, 03/19/2015   Influenza,inj,quad, With Preservative 11/11/2018   Influenza-Unspecified 11/11/2022   PFIZER(Purple Top)SARS-COV-2 Vaccination 03/01/2019, 03/22/2019, 11/22/2019   PNEUMOCOCCAL CONJUGATE-20 10/07/2022   Pfizer Covid-19 Vaccine Bivalent Booster 48yrs & up 12/28/2020   Pneumococcal Polysaccharide-23 12/21/2017   Tdap 02/25/2022   Zoster Recombinant(Shingrix ) 12/25/2017, 06/13/2021    Past Medical History:  Diagnosis Date   Anemia    3 months ago anemic   Anxiety    on meds   Arthritis    all over (01/15/2017)   Asthma    Bronchitis with emphysema    Chest pain    Chronic bronchitis (HCC)    Coronary artery disease    a. 01/2017 she underwent orbital atherectomy/DES to the proxmal LAD and PTCA to ostial D2. 2D Echo 01/15/17 showed mild LVH, EF 60-65%, grade 1 DD.   Family history of anesthesia complication    daughter N/V   Fibromyalgia    GERD (gastroesophageal reflux disease)    on meds   Glaucoma    Heart murmur    History of hiatal hernia    Hx of echocardiogram    Echo (03/2013):  Tech limited; Mild focal basal septal hypertrophy, EF 60-65%, normal RVF   Hyperlipidemia    Hypertension    Nausea vomiting and diarrhea 08/24/2023   Nonsustained ventricular tachycardia (HCC)    OSA on CPAP    Premature atrial contractions    PVC (premature ventricular contraction)    a. Holter 12/16: NSR, occ PAC,PVCs   Sarcoidosis    TIA (transient ischemic attack)    Type II diabetes mellitus (HCC)     Tobacco History: Social History   Tobacco Use   Smoking Status Former   Current packs/day: 0.00   Average packs/day: 1 pack/day for 4.0 years (4.0 ttl pk-yrs)   Types: Cigarettes   Start date: 02/11/1976   Quit date: 02/11/1980   Years since quitting: 43.8  Smokeless Tobacco Never   Counseling given: Not Answered   Outpatient Medications Prior to Visit  Medication Sig Dispense Refill   acetaminophen  (TYLENOL ) 500 MG tablet Take 1,000 mg by mouth 2 (two) times daily as needed for moderate pain or headache.     albuterol  (VENTOLIN  HFA) 108 (90 Base) MCG/ACT inhaler Inhale 2 puffs into the lungs every 6 (six) hours as needed for wheezing or shortness of breath. 18 g 12   aspirin  EC 81 MG tablet Take 81 mg by mouth daily. Swallow whole.     atorvastatin  (LIPITOR) 80 MG tablet Take 1 tablet (80 mg total) by mouth daily. 90 tablet 3   azaTHIOprine  (IMURAN ) 50 MG tablet Take 1 tablet by mouth daily.     Blood Glucose Monitoring Suppl (ACCU-CHEK AVIVA PLUS) w/Device KIT Use to check blood sugars  3 times a day. Dx code e11.65 1 kit 3   Blood Glucose Monitoring Suppl (ACCU-CHEK GUIDE) w/Device KIT USE AS NEEDED TO CHECK BLOOD SUGARS. 1 kit 0   brimonidine  (ALPHAGAN  P) 0.1 % SOLN Place 1 drop into both eyes in the morning, at noon, and at bedtime.     brinzolamide  (AZOPT ) 1 % ophthalmic suspension Place 1 drop into both eyes 3 (three) times daily.     carvedilol  (COREG ) 12.5 MG tablet Take 1 tablet (12.5 mg total) by mouth 2 (two) times daily with a meal. 180 tablet 3   EPINEPHrine  0.3 mg/0.3 mL IJ SOAJ injection Inject 0.3 mg into the muscle as needed for anaphylaxis. 1 each 0   ezetimibe  (ZETIA ) 10 MG tablet Take 1 tablet (10 mg total) by mouth daily. (Patient taking differently: Take 10 mg by mouth at bedtime.) 90 tablet 1   glucose blood (ACCU-CHEK GUIDE TEST) test strip Use as instructed to check blood sugars E11.9 200 each 1   guaiFENesin  (MUCINEX ) 600 MG 12 hr tablet Take 1 tablet (600 mg total) by mouth 2 (two) times daily. (Patient taking  differently: Take 600 mg by mouth daily.) 60 tablet 1   isosorbide  mononitrate (IMDUR ) 60 MG 24 hr tablet TAKE 1 TABLET(60 MG) BY MOUTH DAILY 90 tablet 3   JANUMET  50-500 MG tablet TAKE 1 TABLET BY MOUTH TWICE DAILY WITH A MEAL 90 tablet 1   ketorolac  (ACULAR ) 0.5 % ophthalmic solution Place 1 drop into the right eye 2 (two) times daily.     Lancets (ONETOUCH DELICA PLUS LANCET33G) MISC CHECK BLOOD SUGAR BEFORE BREAKFAST AND DINNER 100 each 1   montelukast  (SINGULAIR ) 10 MG tablet Take 1 tablet (10 mg total) by mouth daily. 90 tablet 1   nitroGLYCERIN  (NITROSTAT ) 0.4 MG SL tablet Place 1 tablet (0.4 mg total) under the tongue every 5 (five) minutes as needed for chest pain. 25 tablet prn   pantoprazole  (PROTONIX ) 40 MG tablet Take 1 tablet (40 mg total) by mouth daily. 90 tablet 3   Polyvinyl Alcohol -Povidone PF (REFRESH) 1.4-0.6 % SOLN Place 1-2 drops into both eyes 3 (three) times daily as needed (dry/irritated eyes.).     prednisoLONE  acetate (PRED FORTE ) 1 % ophthalmic suspension Place 1 drop into both eyes in the morning, at noon, and at bedtime. Place 2 Drops in Left Eye, Place 3 Drops In Right Eye TID     pregabalin  (LYRICA ) 75 MG capsule TAKE 1 CAPSULE(75 MG) BY MOUTH TWICE DAILY 180 capsule 1   albuterol  (PROVENTIL ) (2.5 MG/3ML) 0.083% nebulizer solution Take 3 mLs (2.5 mg total) by nebulization daily as needed for wheezing or shortness of breath. 75 mL 12   benzonatate  (TESSALON  PERLES) 100 MG capsule Take 1-2 tablet as needed by mouth three times per day for cough. (Patient not taking: Reported on 12/24/2023) 30 capsule 1   doxycycline  (VIBRA -TABS) 100 MG tablet Take 1 tablet (100 mg total) by mouth 2 (two) times daily. (Patient not taking: Reported on 12/24/2023) 14 tablet 0   amoxicillin -clavulanate (AUGMENTIN ) 875-125 MG tablet Take 1 tablet by mouth 2 (two) times daily. (Patient not taking: Reported on 12/24/2023) 20 tablet 0   No facility-administered medications prior to visit.      Review of Systems: as above    Physical Exam:  BP 130/84   Pulse 76   Temp 97.6 F (36.4 C)   Ht 5' 2 (1.575 m)   Wt 164 lb 9.6 oz (74.7 kg)   SpO2 97%  Comment: ra  BMI 30.11 kg/m   GEN: Pleasant, interactive, well-appearing; in no acute distress HEENT:  Normocephalic and atraumatic. PERRLA. Sclera white. Nasal turbinates erythematous, moist and patent bilaterally. Clear rhinorrhea present. Oropharynx pink and moist, without exudate or edema. No lesions, ulcerations NECK:  Supple w/ fair ROM. No JVD present. No lymphadenopathy.   CV: RRR, no m/r/g, +2-3 pitting BLE edema. Pulses intact, +2 bilaterally. No cyanosis, pallor or clubbing. PULMONARY:  Unlabored, regular breathing. Diminished bilaterally A&P with inspiratory wheeze R>L. No accessory muscle use.  GI: BS present and normoactive. Soft, non-tender to palpation.  MSK: No erythema, warmth or tenderness. Cap refil <2 sec all extrem. No deformities or joint swelling noted.  Neuro: A/Ox3. No focal deficits noted.   Skin: Warm, no lesions or rashe Psych: Normal affect and behavior. Judgement and thought content appropriate.     Lab Results:  CBC    Component Value Date/Time   WBC 5.1 12/08/2023 0921   RBC 3.96 12/08/2023 0921   HGB 10.8 (L) 12/08/2023 0921   HGB 11.7 08/17/2023 1401   HCT 34.3 (L) 12/08/2023 0921   HCT 37.4 08/17/2023 1401   PLT 188 12/08/2023 0921   PLT 243 08/17/2023 1401   MCV 86.6 12/08/2023 0921   MCV 90 08/17/2023 1401   MCH 27.3 12/08/2023 0921   MCHC 31.5 12/08/2023 0921   RDW 14.0 12/08/2023 0921   RDW 13.8 08/17/2023 1401   LYMPHSABS 0.4 (L) 08/09/2023 0812   MONOABS 1.1 (H) 08/09/2023 0812   EOSABS 0.2 08/09/2023 0812   BASOSABS 0.0 08/09/2023 0812    BMET    Component Value Date/Time   NA 137 12/08/2023 0921   NA 141 08/17/2023 1401   K 3.9 12/08/2023 0921   CL 106 12/08/2023 0921   CO2 22 12/08/2023 0921   GLUCOSE 128 (H) 12/08/2023 0921   BUN 14 12/08/2023 0921    BUN 11 08/17/2023 1401   CREATININE 0.83 12/08/2023 0921   CREATININE 0.73 03/16/2018 1501   CALCIUM  8.7 (L) 12/08/2023 0921   GFRNONAA >60 12/08/2023 0921   GFRNONAA 83 03/16/2018 1501   GFRAA 91 03/28/2020 1212   GFRAA 97 03/16/2018 1501    BNP    Component Value Date/Time   BNP 52.3 12/02/2022 2123   BNP 78.3 01/11/2015 1622     Imaging:  CT SUPER D CHEST WO CONTRAST Result Date: 12/07/2023 EXAM: CT CHEST WITHOUT CONTRAST 12/04/2023 11:55:30 AM TECHNIQUE: CT of the chest was performed without the administration of intravenous contrast. Multiplanar reformatted images are provided for review. Automated exposure control, iterative reconstruction, and/or weight based adjustment of the mA/kV was utilized to reduce the radiation dose to as low as reasonably achievable. COMPARISON: Comparison is made to 10/30/2023. CLINICAL HISTORY: . FINDINGS: MEDIASTINUM: Left anterior descending coronary artery stenting noted. Moderate multivessel coronary artery calcification. Global cardiac size within normal limits. No pericardial effusion. Central pulmonary arteries are of normal caliber. Mild atherosclerotic calcification within the thoracic aorta. No aortic aneurysm. The central airways are clear. LYMPH NODES: No mediastinal, hilar or axillary lymphadenopathy. LUNGS AND PLEURA: The focal area of architectural distortion with nodular septal thickening and mixed solid and cystic composition appears stable in overall size measuring 17 x 38 mm at image 24 / 4 but demonstrates significantly decreased nodularity when compared to prior examination. In combination with additional peribronchovascular nodularity within the surrounding right apex, the findings are most in keeping with evolving inflammatory change, possibly related to the patient's underlying sarcoidosis. Additional areas  of peribronchial nodularity within the right upper lobe with associated traction bronchiolectasis in keeping with some degree of  fibrosis is stable. Bibasilar subpleural pulmonary fibrotic change is stable. Mild emphysema. No new focal pulmonary infiltrates or nodularity is identified to suggest active inflammation. No pulmonary edema. No pleural effusion or pneumothorax. Central airways are widely patent. SOFT TISSUES/BONES: Bilateral breast implants noted. No acute abnormality of the bones or soft tissues. UPPER ABDOMEN: Limited images of the upper abdomen demonstrates no acute abnormality. IMPRESSION: 1. Similarly sized focal area of architectural distortion with nodular septal thickening and mixed solid/cystic composition in the right apex with significantly decreased nodularity, favor evolving inflammatory change related to sarcoidosis; no new suspicious pulmonary nodules or acute infiltrates identified. 2. Stable right upper lobe peribronchial nodularity with traction bronchiolectasis consistent with fibrosis; stable bibasilar subpleural fibrotic change. 3. Mild emphysema (icd-10 j43.9). Low-dose CT lung cancer screening is recommended for patients who are 64-60 years of age with a 20+ pack-year history of smoking and who are currently smoking or quit 15 years ago 4. raf score: emphysema (icd10-j43.9), Aortic atherosclerosis (icd10-i70.0) Electronically signed by: Dorethia Molt MD 12/07/2023 11:20 PM EDT RP Workstation: [id number]    cefTRIAXone  (ROCEPHIN ) injection 500 mg     Date Action Dose Route User   12/22/2023 1449 Given 500 mg Intramuscular (Left Ventrogluteal) Gladis Kristeen PARAS, CMA          Latest Ref Rng & Units 06/29/2023    8:59 AM 01/05/2023   10:31 AM 11/27/2021    1:16 PM 04/13/2020   10:06 AM 11/25/2016    4:11 PM  PFT Results  FVC-Pre L 1.70  1.49  1.67  1.66  1.70   FVC-Predicted Pre % 65  57  63  79  78   FVC-Post L  1.53  1.60  1.59  1.69   FVC-Predicted Post %  59  61  76  78   Pre FEV1/FVC % % 73  81  77  77  81   Post FEV1/FCV % %  79  80  81  83   FEV1-Pre L 1.24  1.20  1.29  1.27  1.39    FEV1-Predicted Pre % 64  62  66  79  82   FEV1-Post L  1.21  1.28  1.28  1.40   DLCO uncorrected ml/min/mmHg 9.11  10.23  9.64  9.27  9.95   DLCO UNC% % 51  57  53  51  46   DLCO corrected ml/min/mmHg  10.72  10.38  9.27  10.18   DLCO COR %Predicted %  60  57  51  47   DLVA Predicted % 79  100  87  80  88   TLC L  2.89  3.24  3.52  3.29   TLC % Predicted %  60  68  73  69   RV % Predicted %  57  71  85  75     No results found for: NITRICOXIDE      Assessment & Plan:   No problem-specific Assessment & Plan notes found for this encounter. Assessment and Plan Assessment & Plan Acute bronchitis Sarcoid Acute sinusitis  Slow to resolve acute bronchitic illness/sarcoid flare with cough and upper respiratory symptoms. Mild bronchospasm on exam. Some improvement with prior [facility] shot/doxycycline  course. Will treat her with full augmentin  course x 7 days and empiric prednisone  burst. Mucociliary clearance therapies encouraged. Target sinus symptoms as this is likely contributing to DOE, cough.  No significant findings on recent CT scan. She does also have significant BLE edema, which she feels is at baseline related to lymphedema. Given prolonged symptoms, will check BNP to rule out potential cardiac component/volume overload. Action plan in place.  - Prescribed Augmentin  for 7 days to address potential respiratory infection. - Prescribed prednisone  40 mg orally for 5 days to reduce inflammation. - Advised use of albuterol  inhaler as needed. - Recommended Mucinex  to thin mucus. - Initiated Flonase nasal spray until symptoms resolve, then use as needed.  Lung nodules Will postpone bronchoscopy as it would be difficult to reassess her prior to Monday to ensure she is improved. Will send message to scheduling pool and Dr. Shelah.  - Coordinate with Dr. Shelah regarding bronchoscopy scheduling based on symptom resolution.  Lymphedema and lower extremity edema Chronic lymphedema with lower  extremity edema. See above - Ordered lab work to assess cardiac function and fluid status. - Will consider diuretics if lab results indicate fluid overload.    Advised if symptoms do not improve or worsen, to please contact office for sooner follow up or seek emergency care.   I spent 45 minutes of dedicated to the care of this patient on the date of this encounter to include pre-visit review of records, face-to-face time with the patient discussing conditions above, post visit ordering of testing, clinical documentation with the electronic health record, making appropriate referrals as documented, and communicating necessary findings to members of the patients care team.  Comer LULLA Rouleau, NP 12/24/2023  Pt aware and understands NP's role.

## 2023-12-24 NOTE — Telephone Encounter (Signed)
 I spoke to patient. She prefers 12/1 and 12/2 at anytime. She will not be available 11/24 and 11/25. I informed her I will give her a call to let her know with he updated appointment as well as follow up visit.   2x called after getting patient scheduled. LVDM about appointments and letter information. Informed patient that letter will be sent in the mailed. Routing to [employer] there needs to be an extension on Auth.

## 2023-12-24 NOTE — Telephone Encounter (Signed)
 Pt still having acute respiratory symptoms. Treating her with augmentin  and prednisone . She has requested to postpone her bronchoscopy again. Alternate dates: 11/24, 11/25, 12/1, 12/2. Please check with pt and reschedule. Thanks!

## 2023-12-25 ENCOUNTER — Ambulatory Visit: Payer: Self-pay | Admitting: Nurse Practitioner

## 2023-12-25 ENCOUNTER — Encounter: Payer: Self-pay | Admitting: *Deleted

## 2023-12-26 NOTE — Assessment & Plan Note (Signed)
 Recurrent UTI with symptoms of frequency, dysuria, and pain. Initial improvement with doxycycline , symptoms recurred post-treatment. No penicillin allergy. - Sent urine culture for antibiotic sensitivity. - Administered Rocephin  injection. - Prescribed oral antibiotics for 3 to 5 days based on culture results.

## 2023-12-26 NOTE — Assessment & Plan Note (Signed)
 Her BMI is acceptable for her demographic. Encouraged to perform chair exercises while watching TV.

## 2023-12-27 ENCOUNTER — Encounter: Payer: Self-pay | Admitting: Internal Medicine

## 2023-12-27 LAB — URINE CULTURE

## 2023-12-27 MED ORDER — AMOXICILLIN-POT CLAVULANATE 500-125 MG PO TABS: 1.0000 | ORAL_TABLET | Freq: Three times a day (TID) | ORAL | 0 refills | Status: DC

## 2023-12-29 ENCOUNTER — Ambulatory Visit: Admitting: Emergency Medicine

## 2024-01-04 ENCOUNTER — Ambulatory Visit: Admitting: Acute Care

## 2024-01-06 ENCOUNTER — Encounter (HOSPITAL_COMMUNITY): Payer: Self-pay | Admitting: Emergency Medicine

## 2024-01-06 ENCOUNTER — Other Ambulatory Visit: Payer: Self-pay

## 2024-01-06 NOTE — Progress Notes (Signed)
 Anesthesia Chart Review: Same day workup  78 year old female follows with cardiology for history of CAD (s/p orbital atherectomy and PCI/DES to prox/mid LAD 2018), HLD, palpitations, NSVT, vasovagal syncope.  Cardiac PET CT 03/2022 was low risk, nonischemic.  Admitted 6/29-08/11/23 with syncope felt to be related to dehydration in setting of nausea, vomiting, diarrhea. Treated for community acquired PNA and UTI. Carotid duplex 08/10/23 bilateral 1-39% stenosis. Echo 08/11/23 LVEF 55-60%, no RWMA, no significant valvular abnormalities. She was discharged with home health PT. last seen in cardiology follow-up by Reche Finder, NP on 08/24/2023.  At that time she was still dealing with persistent cough and prolonged recovery from pneumonia due to underlying respiratory issues.  No cardiac complaints, no changes to management.   Follows with pulmonology for history of former smoker, COPD, asthma, OSA on CPAP, sarcoidosis, interstitial lung disease. She has a history of multiple pulmonary nodules with a waxing and waning tree-in-bud clustered nodularity in the posterior right upper lobe.  She was recommended undergo bronchoscopy and biopsy for tissue diagnosis.  She initially presented for bronchoscopy on 12/08/23 but was noted to have acute URI. Procedure was postponed and Dr. Shelah prescribed course of doxycyline. Pt followed up in pulm clinic with Comer Rouleau, NP on 11/13 and reported persistent URI symptoms. She was prescribed 7d course of augmentin  and 5d course of prednisone  40mg  daily.   Other pertinent history includes GERD on PPI, hiatal hernia, non-insulin -dependent DM2.   BMP 12/24/23 reviewed, WNL.  CBC 12/08/23 reviewed, mild anemia Hgb 10.8, otherwise unremarkable.   Pt will need DOS evaluation.   EKG 08/09/2023: Sinus tachycardia.  Rate 115. Paired ventricular premature complexes. Aberrant complex. Probable LVH with secondary repol abnrm. Anterior Q waves, possibly due to LVH   TTE  08/11/2023: 1. Apical views could not be obtained. Left ventricular ejection  fraction, by estimation, is 55 to 60%. The left ventricle has normal  function. The left ventricle has no regional wall motion abnormalities.  Left ventricular diastolic function could not  be evaluated.   2. Right ventricular systolic function is normal. The right ventricular  size is normal.   3. The mitral valve is normal in structure. No evidence of mitral valve  regurgitation. No evidence of mitral stenosis.   4. The aortic valve is normal in structure. Aortic valve regurgitation is  not visualized. No aortic stenosis is present.   5. The inferior vena cava is normal in size with greater than 50%  respiratory variability, suggesting right atrial pressure of 3 mmHg.   Comparison(s): No significant change from prior study. Prior images  reviewed side by side.    Cardiac PET/CT 03/18/2022:   The study is normal. The study is low risk.   LV perfusion is normal. There is no evidence of ischemia. There is no evidence of infarction.   Rest left ventricular function is normal. Rest EF: 58 %. Stress left ventricular function is normal. Stress EF: 61 %. End diastolic cavity size is normal. End systolic cavity size is normal. No evidence of transient ischemic dilation (TID) noted.   Myocardial blood flow was computed to be 1.53ml/g/min at rest and 2.67ml/g/min at stress. Global myocardial blood flow reserve was 2.10 and was normal.   Coronary calcium  assessment not performed due to prior revascularization.   Electronically signed by: Gayatri A Acharya, MD    Lynwood Hope, PA-C [facility]/Anesthesiology Phone [phone] 01/06/2024 10:09 AM

## 2024-01-06 NOTE — Progress Notes (Signed)
 PCP - [name], MD  Cardiologist - Raford Riggs, MD   PPM/ICD - denies Device Orders - n/a Rep Notified - n/a  Chest x-ray - Chest Ct - 12-04-23 EKG - 08-09-23 Stress Test - 03-18-22 ECHO - 08-11-23 Cardiac Cath - 05-04-2007  CPAP - Per patient sometimes  GLP-1 -denies  Fasting Blood Sugar - on 01-05-24 blood sugar was 110 Checks Blood Sugar  per patient daily sometimes Last A1c on 6-30-*25 6.2  Blood Thinner Instructions: Denies Aspirin  Instructions: instructed patient to reach out to surgeon for further instructions  ERAS Protcol - NPO  COVID TEST- n/a  Anesthesia review: yes, hx of Dm, NSVT, CAD, OSA with CPAP. Per patient today is last day of antibiotic, patient reports small cough at times but it has improved. Denies any temperature. Per patient antibiotic for upper respiratory infections also reported a recent UTI. She is unable to determine if symptoms have resolved due to taking antibiotic at this time.   Patient verbally denies any shortness of breath, fever, cough and chest pain during phone call   -------------  SDW INSTRUCTIONS given:  Your procedure is scheduled on January 11, 2024.  Report to [facility] Main Entrance A at 5:30 A.M., and check in at the Admitting office.  Call this number if you have problems the morning of surgery:  [phone]   Remember:  Do not eat after midnight the night before your surgery      Take these medicines the morning of surgery with A SIP OF WATER  albuterol  (PROVENTIL )  nebulizer solution  acetaminophen  (TYLENOL )  albuterol  (VENTOLIN  HFA)  inhaler  atorvastatin  (LIPITOR)  azaTbrimonidine (ALPHAGAN  P) HIOprine (IMURAN )  brinzolamide  (AZOPT   carvedilol  (COREG )  fluticasone  (FLONASE )  guaiFENesin  (MUCINEX )  isosorbide  mononitrate (IMDUR )  ketorolac  (ACULAR )  montelukast  (SINGULAIR )  nitroGLYCERIN  (NITROSTAT )  pantoprazole  (PROTONIX )  REFRESH  PRED FORTE  pregabalin  (LYRICA )   As of today, STOP taking  any Aspirin  (unless otherwise instructed by your surgeon) Aleve, Naproxen, Ibuprofen , Motrin , Advil , Goody's, BC's, all herbal medications, fish oil, and all vitamins.          WHAT DO I DO ABOUT MY DIABETES MEDICATION?   Do not take oral diabetes medicines JANUMET  (pills) the morning of surgery.   The day of surgery, do not take other diabetes injectables, including Byetta (exenatide), Bydureon (exenatide ER), Victoza (liraglutide), or Trulicity (dulaglutide).  If your CBG is greater than 220 mg/dL, you may take  of your sliding scale (correction) dose of insulin .   HOW TO MANAGE YOUR DIABETES BEFORE AND AFTER SURGERY  Why is it important to control my blood sugar before and after surgery? Improving blood sugar levels before and after surgery helps healing and can limit problems. A way of improving blood sugar control is eating a healthy diet by:  Eating less sugar and carbohydrates  Increasing activity/exercise  Talking with your doctor about reaching your blood sugar goals High blood sugars (greater than 180 mg/dL) can raise your risk of infections and slow your recovery, so you will need to focus on controlling your diabetes during the weeks before surgery. Make sure that the doctor who takes care of your diabetes knows about your planned surgery including the date and location.  How do I manage my blood sugar before surgery? Check your blood sugar at least 4 times a day, starting 2 days before surgery, to make sure that the level is not too high or low.  Check your blood sugar the morning of your surgery when  you wake up and every 2 hours until you get to the Short Stay unit.  If your blood sugar is less than 70 mg/dL, you will need to treat for low blood sugar: Do not take insulin . Treat a low blood sugar (less than 70 mg/dL) with  cup of clear juice (cranberry or apple), 4 glucose tablets, OR glucose gel. Recheck blood sugar in 15 minutes after treatment (to make sure it is  greater than 70 mg/dL). If your blood sugar is not greater than 70 mg/dL on recheck, call [phone] for further instructions. Report your blood sugar to the short stay nurse when you get to Short Stay.  If you are admitted to the hospital after surgery: Your blood sugar will be checked by the staff and you will probably be given insulin  after surgery (instead of oral diabetes medicines) to make sure you have good blood sugar levels. The goal for blood sugar control after surgery is 80-180 mg/dL.             Do not wear jewelry, make up, or nail polish            Do not wear lotions, powders, perfumes/colognes, or deodorant.            Do not shave 48 hours prior to surgery.  Men may shave face and neck.            Do not bring valuables to the hospital.            [facility] is not responsible for any belongings or valuables.  Do NOT Smoke (Tobacco/Vaping) 24 hours prior to your procedure If you use a CPAP at night, you may bring all equipment for your overnight stay.   Contacts, glasses, dentures or bridgework may not be worn into surgery.      For patients admitted to the hospital, discharge time will be determined by your treatment team.   Patients discharged the day of surgery will not be allowed to drive home, and someone needs to stay with them for 24 hours.    Special instructions:   [place]- Preparing For Surgery  Before surgery, you can play an important role. Because skin is not sterile, your skin needs to be as free of germs as possible. You can reduce the number of germs on your skin by washing with CHG (chlorahexidine gluconate) Soap before surgery.  CHG is an antiseptic cleaner which kills germs and bonds with the skin to continue killing germs even after washing.    Oral Hygiene is also important to reduce your risk of infection.  Remember - BRUSH YOUR TEETH THE MORNING OF SURGERY WITH YOUR REGULAR TOOTHPASTE  Please do not use if you have an allergy to CHG or  antibacterial soaps. If your skin becomes reddened/irritated stop using the CHG.  Do not shave (including legs and underarms) for at least 48 hours prior to first CHG shower. It is OK to shave your face.  Please follow these instructions carefully.   Shower the NIGHT BEFORE SURGERY and the MORNING OF SURGERY with DIAL Soap.   Pat yourself dry with a CLEAN TOWEL.  Wear CLEAN PAJAMAS to bed the night before surgery  Place CLEAN SHEETS on your bed the night of your first shower and DO NOT SLEEP WITH PETS.   Day of Surgery: Please shower morning of surgery  Wear Clean/Comfortable clothing the morning of surgery Do not apply any deodorants/lotions.   Remember to brush your teeth WITH  YOUR REGULAR TOOTHPASTE.   Questions were answered. Patient verbalized understanding of instructions.

## 2024-01-06 NOTE — Anesthesia Preprocedure Evaluation (Addendum)
 Anesthesia Evaluation  Patient identified by MRN, date of birth, ID band Patient awake    Reviewed: Allergy & Precautions, H&P , NPO status , Patient's Chart, lab work & pertinent test results  Airway Mallampati: II   Neck ROM: full    Dental   Pulmonary shortness of breath, asthma , sleep apnea , COPD, former smoker   breath sounds clear to auscultation       Cardiovascular hypertension, + angina  + CAD, + Cardiac Stents and +CHF   Rhythm:regular Rate:Normal     Neuro/Psych Seizures -,  PSYCHIATRIC DISORDERS Anxiety Depression    TIA   GI/Hepatic hiatal hernia,GERD  ,,  Endo/Other  diabetes, Type 2    Renal/GU      Musculoskeletal  (+) Arthritis ,  Fibromyalgia -  Abdominal   Peds  Hematology   Anesthesia Other Findings   Reproductive/Obstetrics                              Anesthesia Physical Anesthesia Plan  ASA: 3  Anesthesia Plan: General   Post-op Pain Management:    Induction: Intravenous  PONV Risk Score and Plan: 3 and Ondansetron , Dexamethasone  and Treatment may vary due to age or medical condition  Airway Management Planned: Oral ETT  Additional Equipment:   Intra-op Plan:   Post-operative Plan: Extubation in OR  Informed Consent: I have reviewed the patients History and Physical, chart, labs and discussed the procedure including the risks, benefits and alternatives for the proposed anesthesia with the patient or authorized representative who has indicated his/her understanding and acceptance.     Dental advisory given  Plan Discussed with: CRNA, Anesthesiologist and Surgeon  Anesthesia Plan Comments: (PAT note by Lynwood Hope, PA-C: 78 year old female follows with cardiology for history of CAD (s/p orbital atherectomy and PCI/DES to prox/mid LAD 2018), HLD, palpitations, NSVT, vasovagal syncope.  Cardiac PET CT 03/2022 was low risk, nonischemic.  Admitted  6/29-08/11/23 with syncope felt to be related to dehydration in setting of nausea, vomiting, diarrhea. Treated for community acquired PNA and UTI. Carotid duplex 08/10/23 bilateral 1-39% stenosis. Echo 08/11/23 LVEF 55-60%, no RWMA, no significant valvular abnormalities. She was discharged with home health PT. last seen in cardiology follow-up by Reche Finder, NP on 08/24/2023.  At that time she was still dealing with persistent cough and prolonged recovery from pneumonia due to underlying respiratory issues.  No cardiac complaints, no changes to management.  Follows with pulmonology for history of former smoker, COPD, asthma, OSA on CPAP, sarcoidosis, interstitial lung disease. She has a history of multiple pulmonary nodules with a waxing and waning tree-in-bud clustered nodularity in the posterior right upper lobe.  She was recommended undergo bronchoscopy and biopsy for tissue diagnosis.  She initially presented for bronchoscopy on 12/08/23 but was noted to have acute URI. Procedure was postponed and Dr. Shelah prescribed course of doxycyline. Pt followed up in pulm clinic with Comer Rouleau, NP on 11/13 and reported persistent URI symptoms. She was prescribed 7d course of augmentin  and 5d course of prednisone  40mg  daily.  Other pertinent history includes GERD on PPI, hiatal hernia, non-insulin -dependent DM2.  BMP 12/24/23 reviewed, WNL.  CBC 12/08/23 reviewed, mild anemia Hgb 10.8, otherwise unremarkable.   Pt will need DOS evaluation.  EKG 08/09/2023: Sinus tachycardia.  Rate 115. Paired ventricular premature complexes. Aberrant complex. Probable LVH with secondary repol abnrm. Anterior Q waves, possibly due to LVH  TTE 08/11/2023: 1. Apical views  could not be obtained. Left ventricular ejection  fraction, by estimation, is 55 to 60%. The left ventricle has normal  function. The left ventricle has no regional wall motion abnormalities.  Left ventricular diastolic function could not  be  evaluated.  2. Right ventricular systolic function is normal. The right ventricular  size is normal.  3. The mitral valve is normal in structure. No evidence of mitral valve  regurgitation. No evidence of mitral stenosis.  4. The aortic valve is normal in structure. Aortic valve regurgitation is  not visualized. No aortic stenosis is present.  5. The inferior vena cava is normal in size with greater than 50%  respiratory variability, suggesting right atrial pressure of 3 mmHg.   Comparison(s): No significant change from prior study. Prior images  reviewed side by side.   Cardiac PET/CT 03/18/2022:   The study is normal. The study is low risk.   LV perfusion is normal. There is no evidence of ischemia. There is no evidence of infarction.   Rest left ventricular function is normal. Rest EF: 58 %. Stress left ventricular function is normal. Stress EF: 61 %. End diastolic cavity size is normal. End systolic cavity size is normal. No evidence of transient ischemic dilation (TID) noted.   Myocardial blood flow was computed to be 1.13ml/g/min at rest and 2.79ml/g/min at stress. Global myocardial blood flow reserve was 2.10 and was normal.   Coronary calcium  assessment not performed due to prior revascularization.   Electronically signed by: Gayatri A Acharya, MD  )         Anesthesia Quick Evaluation

## 2024-01-11 ENCOUNTER — Ambulatory Visit (HOSPITAL_COMMUNITY)

## 2024-01-11 ENCOUNTER — Encounter (HOSPITAL_COMMUNITY)
Admission: RE | Disposition: A | Discharge status: Home / Self Care | Payer: Self-pay | Source: Home / Self Care | Attending: Emergency Medicine

## 2024-01-11 ENCOUNTER — Encounter (HOSPITAL_COMMUNITY): Payer: Self-pay | Admitting: Emergency Medicine

## 2024-01-11 ENCOUNTER — Ambulatory Visit (HOSPITAL_COMMUNITY): Payer: Self-pay | Admitting: Physician Assistant

## 2024-01-11 ENCOUNTER — Ambulatory Visit: Payer: Self-pay | Admitting: Internal Medicine

## 2024-01-11 ENCOUNTER — Other Ambulatory Visit: Payer: Self-pay

## 2024-01-11 ENCOUNTER — Ambulatory Visit (HOSPITAL_COMMUNITY)
Admission: RE | Admit: 2024-01-11 | Discharge: 2024-01-11 | Disposition: A | Discharge status: Home / Self Care | Attending: Emergency Medicine | Admitting: Emergency Medicine

## 2024-01-11 DIAGNOSIS — R052 Subacute cough: Secondary | ICD-10-CM

## 2024-01-11 DIAGNOSIS — Z87891 Personal history of nicotine dependence: Secondary | ICD-10-CM | POA: Diagnosis not present

## 2024-01-11 DIAGNOSIS — H42 Glaucoma in diseases classified elsewhere: Secondary | ICD-10-CM | POA: Diagnosis not present

## 2024-01-11 DIAGNOSIS — E1139 Type 2 diabetes mellitus with other diabetic ophthalmic complication: Secondary | ICD-10-CM | POA: Diagnosis not present

## 2024-01-11 DIAGNOSIS — H4010X0 Unspecified open-angle glaucoma, stage unspecified: Secondary | ICD-10-CM | POA: Diagnosis not present

## 2024-01-11 DIAGNOSIS — K219 Gastro-esophageal reflux disease without esophagitis: Secondary | ICD-10-CM | POA: Diagnosis not present

## 2024-01-11 DIAGNOSIS — I509 Heart failure, unspecified: Secondary | ICD-10-CM | POA: Diagnosis not present

## 2024-01-11 DIAGNOSIS — R911 Solitary pulmonary nodule: Secondary | ICD-10-CM | POA: Diagnosis not present

## 2024-01-11 DIAGNOSIS — J019 Acute sinusitis, unspecified: Secondary | ICD-10-CM | POA: Diagnosis not present

## 2024-01-11 DIAGNOSIS — I110 Hypertensive heart disease with heart failure: Secondary | ICD-10-CM

## 2024-01-11 DIAGNOSIS — G40909 Epilepsy, unspecified, not intractable, without status epilepticus: Secondary | ICD-10-CM | POA: Diagnosis not present

## 2024-01-11 DIAGNOSIS — I2510 Atherosclerotic heart disease of native coronary artery without angina pectoris: Secondary | ICD-10-CM

## 2024-01-11 DIAGNOSIS — Z48813 Encounter for surgical aftercare following surgery on the respiratory system: Secondary | ICD-10-CM | POA: Diagnosis not present

## 2024-01-11 DIAGNOSIS — J4489 Other specified chronic obstructive pulmonary disease: Secondary | ICD-10-CM | POA: Diagnosis not present

## 2024-01-11 DIAGNOSIS — I890 Lymphedema, not elsewhere classified: Secondary | ICD-10-CM | POA: Diagnosis not present

## 2024-01-11 DIAGNOSIS — G4733 Obstructive sleep apnea (adult) (pediatric): Secondary | ICD-10-CM | POA: Diagnosis not present

## 2024-01-11 DIAGNOSIS — Z7984 Long term (current) use of oral hypoglycemic drugs: Secondary | ICD-10-CM | POA: Diagnosis not present

## 2024-01-11 DIAGNOSIS — D869 Sarcoidosis, unspecified: Secondary | ICD-10-CM | POA: Diagnosis not present

## 2024-01-11 DIAGNOSIS — R918 Other nonspecific abnormal finding of lung field: Secondary | ICD-10-CM | POA: Diagnosis present

## 2024-01-11 DIAGNOSIS — I5032 Chronic diastolic (congestive) heart failure: Secondary | ICD-10-CM

## 2024-01-11 DIAGNOSIS — I700 Atherosclerosis of aorta: Secondary | ICD-10-CM | POA: Diagnosis not present

## 2024-01-11 DIAGNOSIS — M797 Fibromyalgia: Secondary | ICD-10-CM | POA: Diagnosis not present

## 2024-01-11 DIAGNOSIS — E1151 Type 2 diabetes mellitus with diabetic peripheral angiopathy without gangrene: Secondary | ICD-10-CM | POA: Diagnosis not present

## 2024-01-11 DIAGNOSIS — I25119 Atherosclerotic heart disease of native coronary artery with unspecified angina pectoris: Secondary | ICD-10-CM | POA: Diagnosis not present

## 2024-01-11 DIAGNOSIS — K449 Diaphragmatic hernia without obstruction or gangrene: Secondary | ICD-10-CM | POA: Diagnosis not present

## 2024-01-11 DIAGNOSIS — R0989 Other specified symptoms and signs involving the circulatory and respiratory systems: Secondary | ICD-10-CM | POA: Diagnosis not present

## 2024-01-11 DIAGNOSIS — Z8673 Personal history of transient ischemic attack (TIA), and cerebral infarction without residual deficits: Secondary | ICD-10-CM | POA: Diagnosis not present

## 2024-01-11 DIAGNOSIS — I493 Ventricular premature depolarization: Secondary | ICD-10-CM | POA: Diagnosis not present

## 2024-01-11 DIAGNOSIS — J209 Acute bronchitis, unspecified: Secondary | ICD-10-CM | POA: Diagnosis not present

## 2024-01-11 DIAGNOSIS — E785 Hyperlipidemia, unspecified: Secondary | ICD-10-CM | POA: Diagnosis not present

## 2024-01-11 DIAGNOSIS — Z8701 Personal history of pneumonia (recurrent): Secondary | ICD-10-CM

## 2024-01-11 DIAGNOSIS — G4730 Sleep apnea, unspecified: Secondary | ICD-10-CM | POA: Diagnosis not present

## 2024-01-11 HISTORY — PX: BRONCHIAL NEEDLE ASPIRATION BIOPSY: SHX5106

## 2024-01-11 HISTORY — PX: BRONCHIAL BRUSHINGS: SHX5108

## 2024-01-11 HISTORY — PX: VIDEO BRONCHOSCOPY WITH ENDOBRONCHIAL NAVIGATION: SHX6175

## 2024-01-11 LAB — GLUCOSE, CAPILLARY
Glucose-Capillary: 119 mg/dL — ABNORMAL HIGH (ref 70–99)
Glucose-Capillary: 118 mg/dL — ABNORMAL HIGH (ref 70–99)

## 2024-01-11 MED ORDER — FENTANYL CITRATE (PF) 100 MCG/2ML IJ SOLN: 25.0000 ug | INTRAMUSCULAR | Status: DC | PRN

## 2024-01-11 MED ORDER — GUAIFENESIN ER 600 MG PO TB12: 600.0000 mg | ORAL_TABLET | Freq: Every day | ORAL | Status: AC

## 2024-01-11 MED ORDER — DEXMEDETOMIDINE HCL IN NACL 80 MCG/20ML IV SOLN
INTRAVENOUS | Status: DC | PRN
  Administered 2024-01-11: 8 ug via INTRAVENOUS

## 2024-01-11 MED ORDER — LIDOCAINE 2% (20 MG/ML) 5 ML SYRINGE
INTRAMUSCULAR | Status: DC | PRN
  Administered 2024-01-11: 60 mg via INTRAVENOUS

## 2024-01-11 MED ORDER — CHLORHEXIDINE GLUCONATE 0.12 % MT SOLN
15.0000 mL | Freq: Once | OROMUCOSAL | Status: AC
  Administered 2024-01-11: 15 mL via OROMUCOSAL
  Filled 2024-01-11: qty 15

## 2024-01-11 MED ORDER — DEXAMETHASONE SOD PHOSPHATE PF 10 MG/ML IJ SOLN
INTRAMUSCULAR | Status: DC | PRN
  Administered 2024-01-11: 5 mg via INTRAVENOUS
  Administered 2024-01-11: 8 mg via INTRAVENOUS

## 2024-01-11 MED ORDER — ONDANSETRON HCL 4 MG/2ML IJ SOLN: 4.0000 mg | Freq: Four times a day (QID) | INTRAMUSCULAR | Status: DC | PRN

## 2024-01-11 MED ORDER — LACTATED RINGERS IV SOLN: INTRAVENOUS | Status: DC | PRN

## 2024-01-11 MED ORDER — OXYCODONE HCL 5 MG PO TABS: 5.0000 mg | ORAL_TABLET | Freq: Once | ORAL | Status: DC | PRN

## 2024-01-11 MED ORDER — PROPOFOL 500 MG/50ML IV EMUL
INTRAVENOUS | Status: DC | PRN
  Administered 2024-01-11: 150 ug/kg/min via INTRAVENOUS

## 2024-01-11 MED ORDER — SUGAMMADEX SODIUM 200 MG/2ML IV SOLN
INTRAVENOUS | Status: DC | PRN
  Administered 2024-01-11: 200 mg via INTRAVENOUS

## 2024-01-11 MED ORDER — ONDANSETRON HCL 4 MG/2ML IJ SOLN
INTRAMUSCULAR | Status: DC | PRN
  Administered 2024-01-11: 4 mg via INTRAVENOUS

## 2024-01-11 MED ORDER — OXYCODONE HCL 5 MG/5ML PO SOLN: 5.0000 mg | Freq: Once | ORAL | Status: DC | PRN

## 2024-01-11 MED ORDER — PHENYLEPHRINE HCL-NACL 20-0.9 MG/250ML-% IV SOLN
INTRAVENOUS | Status: DC | PRN
  Administered 2024-01-11: 40 ug/min via INTRAVENOUS

## 2024-01-11 MED ORDER — LACTATED RINGERS IV SOLN: INTRAVENOUS | Status: DC

## 2024-01-11 MED ORDER — PROPOFOL 10 MG/ML IV BOLUS
INTRAVENOUS | Status: DC | PRN
  Administered 2024-01-11: 130 mg via INTRAVENOUS

## 2024-01-11 MED ORDER — ROCURONIUM BROMIDE 10 MG/ML (PF) SYRINGE
PREFILLED_SYRINGE | INTRAVENOUS | Status: DC | PRN
  Administered 2024-01-11: 60 mg via INTRAVENOUS

## 2024-01-11 SURGICAL SUPPLY — 1 items: NDL SUPERTRX PREMARK BIOPSY (NEEDLE) ×2 IMPLANT

## 2024-01-11 NOTE — Transfer of Care (Signed)
 Immediate Anesthesia Transfer of Care Note  Patient: Sheila Reynolds  Procedure(s) Performed: VIDEO BRONCHOSCOPY WITH ENDOBRONCHIAL NAVIGATION (Right) BRONCHOSCOPY, WITH BRUSH BIOPSY BRONCHOSCOPY, WITH NEEDLE ASPIRATION BIOPSY  Patient Location: PACU  Anesthesia Type:General  Level of Consciousness: awake, alert , and oriented  Airway & Oxygen  Therapy: Patient Spontanous Breathing and Patient connected to nasal cannula oxygen   Post-op Assessment: Report given to RN and Post -op Vital signs reviewed and stable  Post vital signs: Reviewed and stable  Last Vitals:  Vitals Value Taken Time  BP 128/74 0840  Temp 97 0840  Pulse 85 0840  Resp 16 0840  SpO2 93 0840    Last Pain:  Vitals:   01/11/24 0631  TempSrc:   PainSc: 0-No pain         Complications: No notable events documented.

## 2024-01-11 NOTE — Op Note (Signed)
 Procedure Note  Patient: Sheila Reynolds  [employer] Cios mobile C-arm was utilized to identify and biopsy right upper lobe opacity.  Needle-in-lesion was confirmed using real-time Cios imaging, and images were uploaded to PACS.      Lamar Chris, MD, PhD 01/11/2024, 1:12 PM [facility] [phone] or if no answer before 7:00PM call [phone] For any issues after 7:00PM please call eLink [phone]

## 2024-01-11 NOTE — Discharge Instructions (Addendum)
 Flexible Bronchoscopy, Care After This sheet gives you information about how to care for yourself after your test. Your doctor may also give you more specific instructions. If you have problems or questions, contact your doctor. Follow these instructions at home: Eating and drinking When you are wide awake, your numbness is gone and your cough and gag reflexes have come back, you may: Start eating only soft foods. Slowly drink liquids. Six hours after the test, go back to your normal diet. Driving Do not drive for 24 hours if you were given a medicine to help you relax (sedative). Do not drive or use heavy machinery while taking prescription pain medicine. General instructions Take over-the-counter and prescription medicines only as told by your doctor. Return to your normal activities as told. Ask what activities are safe for you. Do not use any products that have nicotine or tobacco in them. This includes cigarettes and e-cigarettes. If you need help quitting, ask your doctor. Keep all follow-up visits as told by your doctor. This is important. It is very important if you had a tissue sample (biopsy) taken. Get help right away if: You have shortness of breath that gets worse. You get light-headed. You feel like you are going to pass out (faint). You have chest pain. You cough up: More than a little blood. More blood than before. Summary Do not use cigarettes. Do not use e-cigarettes. Seek care in the Emergency Department right away if you have chest pain or shortness of breath. Call or MyChart Message our office for any questions or problems at [phone].  Okay to resume your aspirin  on 01/12/2024   This information is not intended to replace advice given to you by your health care provider. Make sure you discuss any questions you have with your health care provider.

## 2024-01-11 NOTE — Anesthesia Postprocedure Evaluation (Signed)
 Anesthesia Post Note  Patient: Sheila Reynolds  Procedure(s) Performed: VIDEO BRONCHOSCOPY WITH ENDOBRONCHIAL NAVIGATION (Right) BRONCHOSCOPY, WITH BRUSH BIOPSY BRONCHOSCOPY, WITH NEEDLE ASPIRATION BIOPSY     Patient location during evaluation: PACU Anesthesia Type: General Level of consciousness: awake and alert Pain management: pain level controlled Vital Signs Assessment: post-procedure vital signs reviewed and stable Respiratory status: spontaneous breathing, nonlabored ventilation, respiratory function stable and patient connected to nasal cannula oxygen  Cardiovascular status: blood pressure returned to baseline and stable Postop Assessment: no apparent nausea or vomiting Anesthetic complications: no   There were no known notable events for this encounter.  Last Vitals:  Vitals:   01/11/24 0915 01/11/24 0930  BP: (!) 146/77 (!) 145/81  Pulse: 79 77  Resp: 14 13  Temp:  36.8 C  SpO2: 93% 92%    Last Pain:  Vitals:   01/11/24 0930  TempSrc:   PainSc: Asleep                 Chloeanne Poteet S

## 2024-01-11 NOTE — Progress Notes (Deleted)
 I,Nadav Swindell T Emmitt, CMA,acting as a neurosurgeon for Sheila LOISE Slocumb, MD.,have documented all relevant documentation on the behalf of Sheila LOISE Slocumb, MD,as directed by  Sheila LOISE Slocumb, MD while in the presence of Sheila LOISE Slocumb, MD.  Subjective:  Patient ID: Sheila Reynolds , female    DOB: May 10, 1945 , 78 y.o.   MRN: [id number]  No chief complaint on file.   HPI  HPI   Past Medical History:  Diagnosis Date  . Anemia    3 months ago anemic  . Anxiety    on meds  . Arthritis    all over (01/15/2017)  . Asthma   . Bronchitis with emphysema   . Chest pain   . Chronic bronchitis (HCC)   . Coronary artery disease    a. 01/2017 she underwent orbital atherectomy/DES to the proxmal LAD and PTCA to ostial D2. 2D Echo 01/15/17 showed mild LVH, EF 60-65%, grade 1 DD.  . Family history of anesthesia complication    daughter N/V  . Fibromyalgia   . GERD (gastroesophageal reflux disease)    on meds  . Glaucoma   . Heart murmur   . History of hiatal hernia   . Hx of echocardiogram    Echo (03/2013):  Tech limited; Mild focal basal septal hypertrophy, EF 60-65%, normal RVF  . Hyperlipidemia   . Hypertension   . Nausea vomiting and diarrhea 08/24/2023  . Nonsustained ventricular tachycardia (HCC)   . OSA on CPAP   . Premature atrial contractions   . PVC (premature ventricular contraction)    a. Holter 12/16: NSR, occ PAC,PVCs  . Sarcoidosis   . TIA (transient ischemic attack)   . Type II diabetes mellitus (HCC)      Family History  Problem Relation Age of Onset  . Heart disease Father   . Diabetes Father   . Glaucoma Father   . Cancer Mother        unknown type, ?lung  . Heart disease Paternal Grandmother   . Cancer Paternal Grandmother        unknown type  . Diabetes Brother   . Glaucoma Brother     No current facility-administered medications for this visit.  Current Outpatient Medications:  .  guaiFENesin  (MUCINEX ) 600 MG 12 hr tablet, Take 1 tablet (600 mg total)  by mouth daily., Disp: , Rfl:   Facility-Administered Medications Ordered in Other Visits:  .  fentaNYL  (SUBLIMAZE ) injection 25-50 mcg, 25-50 mcg, Intravenous, Q5 min PRN, Hodierne, Adam, MD .  lactated ringers  infusion, , Intravenous, Continuous, Hodierne, Adam, MD .  ondansetron  (ZOFRAN ) injection 4 mg, 4 mg, Intravenous, Q6H PRN, Hodierne, Adam, MD .  oxyCODONE  (Oxy IR/ROXICODONE ) immediate release tablet 5 mg, 5 mg, Oral, Once PRN **OR** oxyCODONE  (ROXICODONE ) 5 MG/5ML solution 5 mg, 5 mg, Oral, Once PRN, Maryclare Cornet, MD   Allergies  Allergen Reactions  . Crestor [Rosuvastatin Calcium ] Other (See Comments)    muscle aches  . Demerol  [Meperidine Hcl]     Other reaction(s): Hallucinations  . Shellfish Allergy Itching and Other (See Comments)    Crab, shrimp and lobster ---lips itch and tingle Was told not to eat again after having a allergy test. Lobster, crab and shrimp      Review of Systems  Constitutional: Negative.   Respiratory: Negative.    Cardiovascular: Negative.   Neurological: Negative.   Psychiatric/Behavioral: Negative.       There were no vitals filed for this visit. There is no  height or weight on file to calculate BMI.  Wt Readings from Last 3 Encounters:  01/11/24 160 lb (72.6 kg)  12/24/23 164 lb 9.6 oz (74.7 kg)  12/22/23 160 lb (72.6 kg)     Objective:  Physical Exam      Assessment And Plan:  There are no diagnoses linked to this encounter.   No follow-ups on file.  Patient was given opportunity to ask questions. Patient verbalized understanding of the plan and was able to repeat key elements of the plan. All questions were answered to their satisfaction.  Sheila LOISE Slocumb, MD  I, Sheila LOISE Slocumb, MD, have reviewed all documentation for this visit. The documentation on 01/11/24 for the exam, diagnosis, procedures, and orders are all accurate and complete.   IF YOU HAVE BEEN REFERRED TO A SPECIALIST, IT MAY TAKE 1-2 WEEKS TO  SCHEDULE/PROCESS THE REFERRAL. IF YOU HAVE NOT HEARD FROM US /SPECIALIST IN TWO WEEKS, PLEASE GIVE US  A CALL AT [phone].   THE PATIENT IS ENCOURAGED TO PRACTICE SOCIAL DISTANCING DUE TO THE COVID-19 PANDEMIC.

## 2024-01-11 NOTE — Interval H&P Note (Signed)
 History and Physical Interval Note:  01/11/2024 7:20 AM  Sheila Reynolds Patient  has presented today for surgery, with the diagnosis of Right upper lobe nodule.  The various methods of treatment have been discussed with the patient and family. After consideration of risks, benefits and other options for treatment, the patient has consented to  Procedure(s): VIDEO BRONCHOSCOPY WITH ENDOBRONCHIAL NAVIGATION (Right) as a surgical intervention.  The patient's history has been reviewed, patient examined, no change in status, stable for surgery.  I have reviewed the patient's chart and labs.  Questions were answered to the patient's satisfaction.     Lamar GORMAN Chris

## 2024-01-11 NOTE — Progress Notes (Signed)
 Dr. Shelah came by, spoke with patient, looked at xray and said looked good to discharge when meets other pacu criteria.

## 2024-01-11 NOTE — Anesthesia Procedure Notes (Signed)
 Procedure Name: Intubation Date/Time: 01/11/2024 7:41 AM  Performed by: Scherrie Mast, CRNAPre-anesthesia Checklist: Patient identified, Emergency Drugs available, Suction available and Patient being monitored Patient Re-evaluated:Patient Re-evaluated prior to induction Oxygen  Delivery Method: Circle System Utilized Preoxygenation: Pre-oxygenation with 100% oxygen  Induction Type: IV induction Ventilation: Mask ventilation without difficulty Laryngoscope Size: Mac and 3 Grade View: Grade I Tube type: Oral Tube size: 8.0 mm Number of attempts: 1 Airway Equipment and Method: Stylet and Oral airway Placement Confirmation: ETT inserted through vocal cords under direct vision, positive ETCO2 and breath sounds checked- equal and bilateral Secured at: 21 cm Tube secured with: Tape Dental Injury: Teeth and Oropharynx as per pre-operative assessment

## 2024-01-11 NOTE — Op Note (Signed)
 Video Bronchoscopy with Robotic Assisted Bronchoscopic Navigation   Date of Operation: 01/11/2024   Pre-op Diagnosis: Right upper lobe nodular opacity  Post-op Diagnosis: Same  Surgeon: Lamar Chris  Assistants: None  Anesthesia: General endotracheal anesthesia  Operation: Flexible video fiberoptic bronchoscopy with robotic assistance and biopsies.  Estimated Blood Loss: Minimal  Complications: None  Indications and History: Sheila Reynolds is a 78 y.o. female with history of sarcoidosis who has been followed for waxing and waning nodular opacities particularly in the right upper lobe.  Recommendation made to achieve a tissue diagnosis and to obtain culture data via robotic assisted navigational bronchoscopy.  The risks, benefits, complications, treatment options and expected outcomes were discussed with the patient.  The possibilities of pneumothorax, pneumonia, reaction to medication, pulmonary aspiration, perforation of a viscus, bleeding, failure to diagnose a condition and creating a complication requiring transfusion or operation were discussed with the patient who freely signed the consent.    Description of Procedure: The patient was seen in the Preoperative Area, was examined and was deemed appropriate to proceed.  The patient was taken to [facility] Endoscopy room 3, identified as Sheila Reynolds and the procedure verified as Flexible Video Fiberoptic Bronchoscopy.  A Time Out was held and the above information confirmed.   Prior to the date of the procedure a high-resolution CT scan of the chest was performed. Utilizing ION software program a virtual tracheobronchial tree was generated to allow the creation of distinct navigation pathways to the patient's parenchymal abnormalities. After being taken to the operating room general anesthesia was initiated and the patient  was orally intubated. The video fiberoptic bronchoscope was introduced via the endotracheal tube and a general  inspection was performed which showed normal right and left lung anatomy. Aspiration of the bilateral mainstems was completed to remove any remaining secretions. Robotic catheter inserted into patient's endotracheal tube.   Target #1 right upper lobe nodular opacity: The distinct navigation pathways prepared prior to this procedure were then utilized to navigate to patient's lesion identified on CT scan. The robotic catheter was secured into place and the vision probe was withdrawn.  Lesion location was approximated using fluoroscopy.  Local registration and targeting was performed using [employer] Cios mobile C-arm three-dimensional imaging. Under fluoroscopic guidance transbronchial brushings, transbronchial needle biopsies, and transbronchial forceps biopsies were performed to be sent for cytology and pathology.  Needle-in-lesion was confirmed using Cios mobile C-arm.  A bronchioalveolar lavage was performed in the right upper lobe adjacent to the nodule and sent for microbiology.     At the end of the procedure a general airway inspection was performed and there was no evidence of active bleeding. The bronchoscope was removed.  The patient tolerated the procedure well. There was no significant blood loss and there were no obvious complications. A post-procedural chest x-ray is pending.  Samples Target #1: 1. Transbronchial brushings from right upper lobe nodular opacity 2. Transbronchial Wang needle biopsies from right upper lobe nodular opacity 3. Transbronchial forceps biopsies from right upper lobe nodular opacity 4. Bronchoalveolar lavage from right upper lobe   Plans:  The patient will be discharged from the PACU to home when recovered from anesthesia and after chest x-ray is reviewed. We will review the cytology, pathology and microbiology results with the patient when they become available. Outpatient followup will be with CANDIE Lites, NP.   Lamar Chris, MD, PhD 01/11/2024, 8:34  AM [facility] [phone] or if no answer before 7:00PM call [phone]  For any issues after 7:00PM please call eLink [phone]

## 2024-01-12 ENCOUNTER — Encounter (HOSPITAL_COMMUNITY): Payer: Self-pay | Admitting: Emergency Medicine

## 2024-01-13 LAB — CULTURE, BAL-QUANTITATIVE W GRAM STAIN
Culture: NO GROWTH
Gram Stain: NONE SEEN

## 2024-01-14 ENCOUNTER — Ambulatory Visit: Payer: Self-pay | Admitting: Internal Medicine

## 2024-01-14 ENCOUNTER — Other Ambulatory Visit: Payer: Self-pay

## 2024-01-14 ENCOUNTER — Encounter: Payer: Self-pay | Admitting: Internal Medicine

## 2024-01-14 VITALS — BP 126/76 | HR 89 | Temp 98.3°F | Ht 62.0 in | Wt 167.8 lb

## 2024-01-14 DIAGNOSIS — N3000 Acute cystitis without hematuria: Secondary | ICD-10-CM | POA: Diagnosis not present

## 2024-01-14 DIAGNOSIS — I25119 Atherosclerotic heart disease of native coronary artery with unspecified angina pectoris: Secondary | ICD-10-CM

## 2024-01-14 DIAGNOSIS — I110 Hypertensive heart disease with heart failure: Secondary | ICD-10-CM | POA: Diagnosis not present

## 2024-01-14 DIAGNOSIS — J84112 Idiopathic pulmonary fibrosis: Secondary | ICD-10-CM | POA: Diagnosis not present

## 2024-01-14 DIAGNOSIS — E1169 Type 2 diabetes mellitus with other specified complication: Secondary | ICD-10-CM | POA: Diagnosis not present

## 2024-01-14 DIAGNOSIS — I5032 Chronic diastolic (congestive) heart failure: Secondary | ICD-10-CM | POA: Diagnosis not present

## 2024-01-14 DIAGNOSIS — J42 Unspecified chronic bronchitis: Secondary | ICD-10-CM | POA: Diagnosis not present

## 2024-01-14 DIAGNOSIS — E785 Hyperlipidemia, unspecified: Secondary | ICD-10-CM | POA: Diagnosis not present

## 2024-01-14 DIAGNOSIS — J209 Acute bronchitis, unspecified: Secondary | ICD-10-CM

## 2024-01-14 DIAGNOSIS — Z23 Encounter for immunization: Secondary | ICD-10-CM

## 2024-01-14 MED ORDER — ALBUTEROL SULFATE (2.5 MG/3ML) 0.083% IN NEBU: 2.5000 mg | INHALATION_SOLUTION | Freq: Every day | RESPIRATORY_TRACT | 5 refills | Status: AC | PRN

## 2024-01-14 MED ORDER — MONTELUKAST SODIUM 10 MG PO TABS: 10.0000 mg | ORAL_TABLET | Freq: Every day | ORAL | 1 refills | Status: AC

## 2024-01-14 MED ORDER — PANTOPRAZOLE SODIUM 40 MG PO TBEC: 40.0000 mg | DELAYED_RELEASE_TABLET | Freq: Every day | ORAL | 3 refills | Status: AC

## 2024-01-14 NOTE — Progress Notes (Signed)
 I,Victoria T Emmitt, CMA,acting as a neurosurgeon for [name], MD.,have documented all relevant documentation on the behalf of [name], MD,as directed by  [name], MD while in the presence of [name], MD.  Subjective:  Patient ID: Sheila Reynolds , female    DOB: 08-24-1945 , 78 y.o.   MRN: [id number]  Chief Complaint  Patient presents with   Diabetes    Patient being seen today for diabetes check. Patient denies any headaches, SOB, or chest pain. Patient compliant with all medications. Patient experiencing pain on left side under breast that radiates to her back ongoing for 3 weeks.  DM eye exam completed at [facility].     HPI Discussed the use of AI scribe software for clinical note transcription with the patient, who gave verbal consent to proceed.  History of Present Illness Sheila Reynolds is a 78 year old female with diabetes and pulmonary fibrosis who presents for a diabetes check and follow-up on a recent bronchoscopy.  She has experienced blood sugar levels as high as 190 mg/dL.  Following a recent bronchoscopy to investigate a lung nodule, she has experienced increased fatigue and shortness of breath, though her throat soreness has improved. The lung nodule has been noted to grow and then disappear over time. Cultures and biopsy results from the procedure are still pending.  She has increased shortness of breath and coughing, which has improved somewhat with antibiotics. She mentions having a breathing machine but has not received the medication to use with it. She has been on multiple antibiotics recently, including Augmentin , and has some leftover antibiotics from a previous prescription for a urinary tract infection.  She recalls being told something about fibrosis in her lungs, and a CT scan of the chest in September showed central intralobular nodularity.  No recent seizures, despite a previous mention of this diagnosis in her records, which  she disputes.   Diabetes She presents for her follow-up diabetic visit. She has type 2 diabetes mellitus. Her disease course has been stable. There are no hypoglycemic associated symptoms. Pertinent negatives for hypoglycemia include no dizziness or headaches. Pertinent negatives for diabetes include no blurred vision, no chest pain, no polydipsia, no polyphagia, no polyuria and no weakness. There are no hypoglycemic complications. Diabetic complications include heart disease. Her weight is stable. Her breakfast blood glucose is taken between 7-8 am. Her breakfast blood glucose range is generally 90-110 mg/dl. An ACE inhibitor/angiotensin II receptor blocker is being taken. Eye exam is current.  Hypertension This is a chronic problem. The current episode started more than 1 year ago. The problem has been gradually improving since onset. The problem is controlled. Pertinent negatives include no blurred vision, chest pain, headaches, orthopnea, palpitations or shortness of breath. Risk factors for coronary artery disease include diabetes mellitus, dyslipidemia, post-menopausal state and sedentary lifestyle. The current treatment provides moderate improvement. Compliance problems include exercise.      Past Medical History:  Diagnosis Date   Anemia    3 months ago anemic   Anxiety    on meds   Arthritis    all over (01/15/2017)   Asthma    Bronchitis with emphysema    Chest pain    Chronic bronchitis (HCC)    Coronary artery disease    a. 01/2017 she underwent orbital atherectomy/DES to the proxmal LAD and PTCA to ostial D2. 2D Echo 01/15/17 showed mild LVH, EF 60-65%, grade 1 DD.   Family history of  anesthesia complication    daughter N/V   Fibromyalgia    GERD (gastroesophageal reflux disease)    on meds   Glaucoma    Heart murmur    History of hiatal hernia    Hx of echocardiogram    Echo (03/2013):  Tech limited; Mild focal basal septal hypertrophy, EF 60-65%, normal RVF    Hyperlipidemia    Hypertension    Nausea vomiting and diarrhea 08/24/2023   Nonsustained ventricular tachycardia (HCC)    OSA on CPAP    Premature atrial contractions    PVC (premature ventricular contraction)    a. Holter 12/16: NSR, occ PAC,PVCs   Sarcoidosis    TIA (transient ischemic attack)    Type II diabetes mellitus (HCC)      Family History  Problem Relation Age of Onset   Heart disease Father    Diabetes Father    Glaucoma Father    Cancer Mother        unknown type, ?lung   Heart disease Paternal Grandmother    Cancer Paternal Grandmother        unknown type   Diabetes Brother    Glaucoma Brother      Current Outpatient Medications:    acetaminophen  (TYLENOL ) 500 MG tablet, Take 1,000 mg by mouth 2 (two) times daily as needed for moderate pain or headache., Disp: , Rfl:    albuterol  (VENTOLIN  HFA) 108 (90 Base) MCG/ACT inhaler, Inhale 2 puffs into the lungs every 6 (six) hours as needed for wheezing or shortness of breath., Disp: 18 g, Rfl: 12   aspirin  EC 81 MG tablet, Take 81 mg by mouth daily. Swallow whole., Disp: , Rfl:    atorvastatin  (LIPITOR) 80 MG tablet, Take 1 tablet (80 mg total) by mouth daily., Disp: 90 tablet, Rfl: 3   azaTHIOprine  (IMURAN ) 50 MG tablet, Take 1 tablet by mouth daily., Disp: , Rfl:    Blood Glucose Monitoring Suppl (ACCU-CHEK AVIVA PLUS) w/Device KIT, Use to check blood sugars 3 times a day. Dx code e11.65, Disp: 1 kit, Rfl: 3   Blood Glucose Monitoring Suppl (ACCU-CHEK GUIDE) w/Device KIT, USE AS NEEDED TO CHECK BLOOD SUGARS., Disp: 1 kit, Rfl: 0   brimonidine  (ALPHAGAN  P) 0.1 % SOLN, Place 1 drop into both eyes in the morning, at noon, and at bedtime., Disp: , Rfl:    brinzolamide  (AZOPT ) 1 % ophthalmic suspension, Place 1 drop into both eyes 3 (three) times daily., Disp: , Rfl:    carvedilol  (COREG ) 12.5 MG tablet, Take 1 tablet (12.5 mg total) by mouth 2 (two) times daily with a meal., Disp: 180 tablet, Rfl: 3   EPINEPHrine  0.3  mg/0.3 mL IJ SOAJ injection, Inject 0.3 mg into the muscle as needed for anaphylaxis., Disp: 1 each, Rfl: 0   ezetimibe  (ZETIA ) 10 MG tablet, Take 1 tablet (10 mg total) by mouth daily., Disp: 90 tablet, Rfl: 1   fluticasone  (FLONASE ) 50 MCG/ACT nasal spray, Place 2 sprays into both nostrils daily., Disp: 18.2 mL, Rfl: 2   glucose blood (ACCU-CHEK GUIDE TEST) test strip, Use as instructed to check blood sugars E11.9, Disp: 200 each, Rfl: 1   guaiFENesin  (MUCINEX ) 600 MG 12 hr tablet, Take 1 tablet (600 mg total) by mouth daily., Disp: , Rfl:    isosorbide  mononitrate (IMDUR ) 60 MG 24 hr tablet, TAKE 1 TABLET(60 MG) BY MOUTH DAILY, Disp: 90 tablet, Rfl: 3   ketorolac  (ACULAR ) 0.5 % ophthalmic solution, Place 1 drop into the right eye 2 (  two) times daily., Disp: , Rfl:    Lancets (ONETOUCH DELICA PLUS LANCET33G) MISC, CHECK BLOOD SUGAR BEFORE BREAKFAST AND DINNER, Disp: 100 each, Rfl: 1   Polyvinyl Alcohol -Povidone PF (REFRESH) 1.4-0.6 % SOLN, Place 1-2 drops into both eyes 3 (three) times daily as needed (dry/irritated eyes.)., Disp: , Rfl:    prednisoLONE  acetate (PRED FORTE ) 1 % ophthalmic suspension, Place 1 drop into both eyes in the morning, at noon, and at bedtime. Place 2 Drops in Left Eye, Place 3 Drops In Right Eye TID, Disp: , Rfl:    pregabalin  (LYRICA ) 75 MG capsule, TAKE 1 CAPSULE(75 MG) BY MOUTH TWICE DAILY, Disp: 180 capsule, Rfl: 1   albuterol  (PROVENTIL ) (2.5 MG/3ML) 0.083% nebulizer solution, Take 3 mLs (2.5 mg total) by nebulization daily as needed for wheezing or shortness of breath., Disp: 75 mL, Rfl: 5   estradiol (ESTRACE) 0.01 % CREA vaginal cream, INSERT 1 GRAM VAGINALLY FOR 45 DAYS, Disp: , Rfl:    JANUMET  50-500 MG tablet, TAKE 1 TABLET BY MOUTH TWICE DAILY WITH A MEAL, Disp: 90 tablet, Rfl: 1   montelukast  (SINGULAIR ) 10 MG tablet, Take 1 tablet (10 mg total) by mouth daily., Disp: 90 tablet, Rfl: 1   nitroGLYCERIN  (NITROSTAT ) 0.4 MG SL tablet, Place 1 tablet (0.4 mg total)  under the tongue every 5 (five) minutes as needed for chest pain., Disp: 25 tablet, Rfl: prn   pantoprazole  (PROTONIX ) 40 MG tablet, Take 1 tablet (40 mg total) by mouth daily., Disp: 90 tablet, Rfl: 3   RHOPRESSA  0.02 % SOLN, OLACE 1 DROP INTO LEFT EYE EVERY NIGHT AT BEDTIME, Disp: , Rfl:    umeclidinium-vilanterol (ANORO ELLIPTA ) 62.5-25 MCG/ACT AEPB, Inhale 1 puff into the lungs daily., Disp: , Rfl:    Allergies  Allergen Reactions   Crestor [Rosuvastatin Calcium ] Other (See Comments)    muscle aches   Demerol  [Meperidine Hcl]     Other reaction(s): Hallucinations   Shellfish Allergy Itching and Other (See Comments)    Crab, shrimp and lobster ---lips itch and tingle Was told not to eat again after having a allergy test. Lobster, crab and shrimp      Review of Systems  Constitutional: Negative.   Eyes:  Negative for blurred vision.  Respiratory: Negative.  Negative for shortness of breath.   Cardiovascular: Negative.  Negative for chest pain, palpitations and orthopnea.  Endocrine: Negative for polydipsia, polyphagia and polyuria.  Neurological: Negative.  Negative for dizziness, weakness and headaches.  Psychiatric/Behavioral: Negative.       Today's Vitals   01/14/24 1548  BP: 126/76  Pulse: 89  Temp: 98.3 F (36.8 C)  SpO2: 99%  Weight: 167 lb 12.8 oz (76.1 kg)  Height: 5' 2 (1.575 m)   Body mass index is 30.69 kg/m.  Wt Readings from Last 3 Encounters:  01/18/24 166 lb (75.3 kg)  01/14/24 167 lb 12.8 oz (76.1 kg)  01/11/24 160 lb (72.6 kg)     Objective:  Physical Exam Vitals and nursing note reviewed.  Constitutional:      Appearance: Normal appearance.  HENT:     Head: Normocephalic and atraumatic.  Eyes:     Extraocular Movements: Extraocular movements intact.  Cardiovascular:     Rate and Rhythm: Normal rate and regular rhythm.     Heart sounds: Normal heart sounds.  Pulmonary:     Effort: Pulmonary effort is normal.     Breath sounds: Normal  breath sounds.  Musculoskeletal:     Cervical back:  Normal range of motion.  Skin:    General: Skin is warm.  Neurological:     General: No focal deficit present.     Mental Status: She is alert.  Psychiatric:        Mood and Affect: Mood normal.        Behavior: Behavior normal.         Assessment And Plan:  Dyslipidemia associated with type 2 diabetes mellitus (HCC) Assessment & Plan: Chronic, she is currently on Janumet . Importance of dietary and medication compliance was discussed with the patient. LDL goal is less than 70, she will continue with atorvastatin  80mg  daily and ezetimibe  10mg  daily. She is encouraged to follow heart healthy lifestyle.   Orders: -     Hemoglobin A1c  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 heart failure (HCC) Assessment & Plan: Chronic, well controlled. Importance of medication compliance was discussed with the patient. Encouraged to follow low sodium diet. No med changes today.    Coronary artery disease involving native coronary artery of native heart with angina pectoris Assessment & Plan: - We will continue aspirin , Plavix , Imdur  and beta-blocker therapy as well as statin therapy.   Acute cystitis without hematuria Assessment & Plan: Currently on antibiotics for UTI. - Continue current antibiotic regimen.   IPF (idiopathic pulmonary fibrosis) (HCC) Assessment & Plan: CT scan showed central intralobular nodularity. Symptoms include shortness of breath and coughing. Improvement with antibiotics. - Sent prescription for albuterol  nebules to pharmacy. - Await biopsy and culture results.   Chronic bronchitis, unspecified chronic bronchitis type (HCC) -     Albuterol  Sulfate; Take 3 mLs (2.5 mg total) by nebulization daily as needed for wheezing or shortness of breath.  Dispense: 75 mL; Refill: 5  Immunization due -     Flu vaccine trivalent PF, 6mos and older(Flulaval,Afluria,Fluarix,Fluzone)    Return for 4  month dm f/u.SABRA  Patient was given opportunity to ask questions. Patient verbalized understanding of the plan and was able to repeat key elements of the plan. All questions were answered to their satisfaction.   I, [name], MD, have reviewed all documentation for this visit. The documentation on 01/23/2024 for the exam, diagnosis, procedures, and orders are all accurate and complete.   IF YOU HAVE BEEN REFERRED TO A SPECIALIST, IT MAY TAKE 1-2 WEEKS TO SCHEDULE/PROCESS THE REFERRAL. IF YOU HAVE NOT HEARD FROM US /SPECIALIST IN TWO WEEKS, PLEASE GIVE US  A CALL AT [phone].

## 2024-01-14 NOTE — Patient Instructions (Signed)

## 2024-01-15 LAB — HEMOGLOBIN A1C
Est. average glucose Bld gHb Est-mCnc: 131 mg/dL
Hgb A1c MFr Bld: 6.2 % — ABNORMAL HIGH (ref 4.8–5.6)

## 2024-01-15 LAB — CYTOLOGY - NON PAP

## 2024-01-16 ENCOUNTER — Other Ambulatory Visit: Payer: Self-pay | Admitting: Internal Medicine

## 2024-01-16 LAB — ACID FAST SMEAR (AFB, MYCOBACTERIA): Acid Fast Smear: NEGATIVE

## 2024-01-18 ENCOUNTER — Ambulatory Visit: Admitting: Acute Care

## 2024-01-18 ENCOUNTER — Encounter: Payer: Self-pay | Admitting: Acute Care

## 2024-01-18 ENCOUNTER — Ambulatory Visit: Payer: Self-pay | Admitting: Internal Medicine

## 2024-01-18 VITALS — BP 120/78 | HR 84 | Temp 98.3°F | Ht 62.0 in | Wt 166.0 lb

## 2024-01-18 DIAGNOSIS — R9389 Abnormal findings on diagnostic imaging of other specified body structures: Secondary | ICD-10-CM | POA: Diagnosis not present

## 2024-01-18 DIAGNOSIS — R911 Solitary pulmonary nodule: Secondary | ICD-10-CM

## 2024-01-18 DIAGNOSIS — J449 Chronic obstructive pulmonary disease, unspecified: Secondary | ICD-10-CM

## 2024-01-18 DIAGNOSIS — Z9889 Other specified postprocedural states: Secondary | ICD-10-CM | POA: Diagnosis not present

## 2024-01-18 DIAGNOSIS — J8410 Pulmonary fibrosis, unspecified: Secondary | ICD-10-CM

## 2024-01-18 DIAGNOSIS — Z87891 Personal history of nicotine dependence: Secondary | ICD-10-CM | POA: Diagnosis not present

## 2024-01-18 NOTE — Patient Instructions (Addendum)
 It is good to see you today. I am glad you have done well after the biopsy. The biopsy was negative for cancer, and also negative for granulomas. This is good news. I will refer you back to Dr. Geronimo for further work up. Please schedule with Dr. Geronimo in 1-2 months.  Call for any fever or worsening change in color of secretions so we can get you in to be seen.  Call if you need us  sooner.  Please contact office for sooner follow up if symptoms do not improve or worsen or seek emergency care

## 2024-01-18 NOTE — Progress Notes (Signed)
 History of Present Illness Sheila Reynolds is a 78 y.o. 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 She is a patient of Dr. Lanny and also seen by Dr. Geronimo in the past. Past medical history significant for PVCs, PVD, HTN, HF, CAD, GERD, DM, HLD, epilepsy, DDD, fibromyalgia, RA and open angle glaucoma.    01/18/2024 Discussed the use of AI scribe software for clinical note transcription with the patient, who gave verbal consent to proceed.  History of Present Illness Pt. Presents for follow up after bronchoscopy with biopsies on 01/11/2024. Biopsy was done to better evaluate waxing and waning pulmonary lung nodules . Suspicion for malignancy was lower given the waxing and waning nature of the nodules. Plan was for biopsy of a right upper lobe to rule out malignancy and possibly rule in inflammatory process like sarcoid. She does use an albuterol  inhaler a few times a week. She also uses an Anoro Inhaler Daily.  She states she did well after her bronchoscopy with biopsies. No bleeding, fever, discolored secretions or worsening dyspnea than baseline . No adverse reaction to anesthesia, although she did state she was sleepy for a few days.   We have reviewed the results of her biopsy. The biopsy results showed benign bronchial cells with mixed inflammation but no malignant cells or granulomatous changes. We discussed that this biopsy did not confirm sarcoidosis as there were no granulomas present.   She has a history of sarcoidosis and ILD and was treated with prednisone  during a previous respiratory flare-up. She is confused about her diagnosis, as previous doctors attributed her symptoms to sarcoidosis, but the recent biopsy did not confirm this. I explained that it may be we just didn't get any granulomatous cells with the biopsy. We also discussed that this may be related to other autoimmune issues. She also has a  history of Rheumatoid Arthritis, which can also cause pulmonary nodules.    She has a history of COPD and has undergone pulmonary function tests. She experiences shortness of breath and cough, which have improved with the use of inhalers such as albuterol  and Anoro. Despite these treatments, she continues to experience some cough, however it is better than it has been in the past. She endorses shortness of breath. She quit smoking over 40 years ago. The non malignant diagnosis is reassuring.   She reports experiencing excruciating pain that radiates from her right front to her back, which is exacerbated by bending over and is associated with shortness of breath. This pain has become more frequent. She has discussed this with her primary care doctor, but no definitive cause has been identified.  She has a history of rheumatoid arthritis and has been treated with azathioprine , which she finds somewhat helpful for pain. She previously tried methotrexate  but discontinued it due to side effects. She also experiences reflux, which she is treating. She continues to follow up with rheumatology.  Plan will be for follow up with Dr. Geronimo to further evaluate cause, and for treatment  of her ILD.     Test Results: 01/11/2024 Cytology A. LUNG, RUL NODULE, NEEDLE ASPIRATION  BIOPSY:  - No malignant cells identified  - Benign bronchial cells and extensive mixed inflammation   B. LUNG, RUL NODULE, BRUSHING:  - No malignant cells identified  - Benign bronchial cells and extensive mixed inflammation   01/11/2024 BAL  No Growth  01/11/2024 Fungal Pending  06/29/2023 PFT: FVC 65, FEV1  64, ratio 73, DLCO 51 08/11/2023 echo: EF 55-60%. RV size and function normal.  12/04/2023 Super D CT chest: focal area of architectural distortion with nodular thickening and mixed solid/cystic in right apex with decreased nodularity, favoring inflammatory change. Stable RUL nodularity with traction btx consistent with  fibrosis, stable. Mild emphysema. Atherosclerosis.       Latest Ref Rng & Units 12/08/2023    9:21 AM 08/17/2023    2:01 PM 08/11/2023    5:19 AM  CBC  WBC 4.0 - 10.5 K/uL 5.1  10.1  7.4   Hemoglobin 12.0 - 15.0 g/dL 89.1  88.2  87.9   Hematocrit 36.0 - 46.0 % 34.3  37.4  36.1   Platelets 150 - 400 K/uL 188  243  202        Latest Ref Rng & Units 12/24/2023   10:52 AM 12/08/2023    9:21 AM 08/17/2023    2:01 PM  BMP  Glucose 70 - 99 mg/dL 91  871  91   BUN 6 - 23 mg/dL 11  14  11    Creatinine 0.40 - 1.20 mg/dL 9.13  9.16  9.08   BUN/Creat Ratio 12 - 28   12   Sodium 135 - 145 mEq/L 138  137  141   Potassium 3.5 - 5.1 mEq/L 4.8  3.9  4.6   Chloride 96 - 112 mEq/L 103  106  100   CO2 19 - 32 mEq/L 28  22  24    Calcium  8.4 - 10.5 mg/dL 9.5  8.7  89.9     BNP    Component Value Date/Time   BNP 52.3 12/02/2022 2123   BNP 78.3 01/11/2015 1622    ProBNP    Component Value Date/Time   PROBNP 46.0 12/24/2023 1052    PFT    Component Value Date/Time   FEV1PRE 1.24 06/29/2023 0859   FEV1POST 1.21 01/05/2023 1031   FVCPRE 1.70 06/29/2023 0859   FVCPOST 1.53 01/05/2023 1031   TLC 2.89 01/05/2023 1031   DLCOUNC 9.11 06/29/2023 0859   PREFEV1FVCRT 73 06/29/2023 0859   PSTFEV1FVCRT 79 01/05/2023 1031    DG Chest Port 1 View Result Date: 01/11/2024 CLINICAL DATA:  [id number] S/P bronchoscopy with biopsy [id number] EXAM: PORTABLE CHEST - 1 VIEW COMPARISON:  August 09, 2023, December 04, 2023 FINDINGS: Low lung volumes. Nodular opacity in the right upper lung zone. No pleural effusion or pneumothorax. No cardiomegaly.Aortic atherosclerosis.No acute fracture or destructive lesion. IMPRESSION: 1. No acute cardiopulmonary abnormality. 2. Nodular opacity in the right upper lung zone, corresponding to the nodular consolidation on the prior chest CT. Electronically Signed   By: Rogelia Myers M.D.   On: 01/11/2024 09:29   DG C-ARM BRONCHOSCOPY Result Date: 01/11/2024 C-ARM BRONCHOSCOPY:  Fluoroscopy was utilized by the requesting physician.  No radiographic interpretation.     Past medical hx Past Medical History:  Diagnosis Date   Anemia    3 months ago anemic   Anxiety    on meds   Arthritis    all over (01/15/2017)   Asthma    Bronchitis with emphysema    Chest pain    Chronic bronchitis (HCC)    Coronary artery disease    a. 01/2017 she underwent orbital atherectomy/DES to the proxmal LAD and PTCA to ostial D2. 2D Echo 01/15/17 showed mild LVH, EF 60-65%, grade 1 DD.   Family history of anesthesia complication    daughter N/V   Fibromyalgia    GERD (gastroesophageal  reflux disease)    on meds   Glaucoma    Heart murmur    History of hiatal hernia    Hx of echocardiogram    Echo (03/2013):  Tech limited; Mild focal basal septal hypertrophy, EF 60-65%, normal RVF   Hyperlipidemia    Hypertension    Nausea vomiting and diarrhea 08/24/2023   Nonsustained ventricular tachycardia (HCC)    OSA on CPAP    Premature atrial contractions    PVC (premature ventricular contraction)    a. Holter 12/16: NSR, occ PAC,PVCs   Sarcoidosis    TIA (transient ischemic attack)    Type II diabetes mellitus (HCC)      Social History   Tobacco Use   Smoking status: Former    Current packs/day: 0.00    Average packs/day: 1 pack/day for 4.0 years (4.0 ttl pk-yrs)    Types: Cigarettes    Start date: 02/11/1976    Quit date: 02/11/1980    Years since quitting: 43.9   Smokeless tobacco: Never  Vaping Use   Vaping status: Never Used  Substance Use Topics   Alcohol  use: No   Drug use: No    Ms.Makara reports that she quit smoking about 43 years ago. Her smoking use included cigarettes. She started smoking about 47 years ago. She has a 4 pack-year smoking history. She has never used smokeless tobacco. She reports that she does not drink alcohol  and does not use drugs.  Tobacco Cessation: Counseling given: Not Answered Former smoker   Past surgical hx, Family hx, Social  hx all reviewed.  Current Outpatient Medications on File Prior to Visit  Medication Sig   acetaminophen  (TYLENOL ) 500 MG tablet Take 1,000 mg by mouth 2 (two) times daily as needed for moderate pain or headache.   albuterol  (PROVENTIL ) (2.5 MG/3ML) 0.083% nebulizer solution Take 3 mLs (2.5 mg total) by nebulization daily as needed for wheezing or shortness of breath.   albuterol  (VENTOLIN  HFA) 108 (90 Base) MCG/ACT inhaler Inhale 2 puffs into the lungs every 6 (six) hours as needed for wheezing or shortness of breath.   aspirin  EC 81 MG tablet Take 81 mg by mouth daily. Swallow whole.   atorvastatin  (LIPITOR) 80 MG tablet Take 1 tablet (80 mg total) by mouth daily.   azaTHIOprine  (IMURAN ) 50 MG tablet Take 1 tablet by mouth daily.   Blood Glucose Monitoring Suppl (ACCU-CHEK AVIVA PLUS) w/Device KIT Use to check blood sugars 3 times a day. Dx code e11.65   Blood Glucose Monitoring Suppl (ACCU-CHEK GUIDE) w/Device KIT USE AS NEEDED TO CHECK BLOOD SUGARS.   brimonidine  (ALPHAGAN  P) 0.1 % SOLN Place 1 drop into both eyes in the morning, at noon, and at bedtime.   brinzolamide  (AZOPT ) 1 % ophthalmic suspension Place 1 drop into both eyes 3 (three) times daily.   carvedilol  (COREG ) 12.5 MG tablet Take 1 tablet (12.5 mg total) by mouth 2 (two) times daily with a meal.   EPINEPHrine  0.3 mg/0.3 mL IJ SOAJ injection Inject 0.3 mg into the muscle as needed for anaphylaxis.   estradiol (ESTRACE) 0.01 % CREA vaginal cream INSERT 1 GRAM VAGINALLY FOR 45 DAYS   ezetimibe  (ZETIA ) 10 MG tablet Take 1 tablet (10 mg total) by mouth daily.   fluticasone  (FLONASE ) 50 MCG/ACT nasal spray Place 2 sprays into both nostrils daily.   glucose blood (ACCU-CHEK GUIDE TEST) test strip Use as instructed to check blood sugars E11.9   guaiFENesin  (MUCINEX ) 600 MG 12 hr tablet Take 1  tablet (600 mg total) by mouth daily.   isosorbide  mononitrate (IMDUR ) 60 MG 24 hr tablet TAKE 1 TABLET(60 MG) BY MOUTH DAILY   ketorolac  (ACULAR )  0.5 % ophthalmic solution Place 1 drop into the right eye 2 (two) times daily.   Lancets (ONETOUCH DELICA PLUS LANCET33G) MISC CHECK BLOOD SUGAR BEFORE BREAKFAST AND DINNER   montelukast  (SINGULAIR ) 10 MG tablet Take 1 tablet (10 mg total) by mouth daily.   nitroGLYCERIN  (NITROSTAT ) 0.4 MG SL tablet Place 1 tablet (0.4 mg total) under the tongue every 5 (five) minutes as needed for chest pain.   pantoprazole  (PROTONIX ) 40 MG tablet Take 1 tablet (40 mg total) by mouth daily.   Polyvinyl Alcohol -Povidone PF (REFRESH) 1.4-0.6 % SOLN Place 1-2 drops into both eyes 3 (three) times daily as needed (dry/irritated eyes.).   prednisoLONE  acetate (PRED FORTE ) 1 % ophthalmic suspension Place 1 drop into both eyes in the morning, at noon, and at bedtime. Place 2 Drops in Left Eye, Place 3 Drops In Right Eye TID   pregabalin  (LYRICA ) 75 MG capsule TAKE 1 CAPSULE(75 MG) BY MOUTH TWICE DAILY   RHOPRESSA  0.02 % SOLN OLACE 1 DROP INTO LEFT EYE EVERY NIGHT AT BEDTIME   umeclidinium-vilanterol (ANORO ELLIPTA ) 62.5-25 MCG/ACT AEPB Inhale 1 puff into the lungs daily.   JANUMET  50-500 MG tablet TAKE 1 TABLET BY MOUTH TWICE DAILY WITH A MEAL   No current facility-administered medications on file prior to visit.     Allergies  Allergen Reactions   Crestor [Rosuvastatin Calcium ] Other (See Comments)    muscle aches   Demerol  [Meperidine Hcl]     Other reaction(s): Hallucinations   Shellfish Allergy Itching and Other (See Comments)    Crab, shrimp and lobster ---lips itch and tingle Was told not to eat again after having a allergy test. Lobster, crab and shrimp     Review Of Systems:  Constitutional:   No  weight loss, night sweats,  Fevers, chills, fatigue, or  lassitude.  HEENT:   No headaches,  Difficulty swallowing,  Tooth/dental problems, or  Sore throat,                No sneezing, itching, ear ache, nasal congestion, post nasal drip,   CV:  No chest pain,  Orthopnea, PND, swelling in lower  extremities, anasarca, dizziness, palpitations, syncope. 1 + BLE edema  GI  No heartburn, indigestion, abdominal pain, nausea, vomiting, diarrhea, change in bowel habits, loss of appetite, bloody stools.   Resp: + shortness of breath with exertion less at rest.  + baseline  excess mucus, + baseline  productive cough,  + baseline  non-productive cough,  No coughing up of blood.  No change in color of mucus.  No wheezing.  No chest wall deformity  Skin: no rash or lesions, no obvious deformities.  GU: no dysuria, change in color of urine, no urgency or frequency.  No flank pain, no hematuria   MS:  No joint pain or swelling.  No decreased range of motion.  No back pain.  Psych:  No change in mood or affect. No depression or anxiety.  No memory loss.   Vital Signs BP 120/78   Pulse 84   Temp 98.3 F (36.8 C) (Oral)   Ht 5' 2 (1.575 m)   Wt 166 lb (75.3 kg)   SpO2 99%   BMI 30.36 kg/m    Physical Exam:  Physical Exam VITALS: T- 98.3 GENERAL: No distress, alert and oriented times  3. EARS NOSE THROAT: No sinus tenderness, tympanic membranes clear, pale nasal mucosa, no oral exudate, no post nasal drip, no lymphadenopathy. CHEST: Lungs clear to auscultation, no wheeze, rales, dullness, no accessory muscle use, no nasal flaring, no sternal retractions. CARDIAC: S1, S2, regular rate and rhythm, no murmur.Trace BLE effusions  ABDOMINAL: Soft, non tender. ND, BS present, Body mass index is 30.36 kg/m.  EXTREMITIES: No clubbing, cyanosis, trace 1 + BLE edema. No obvious deformities. NEUROLOGICAL: Normal strength. Alert and oriented x 3, MAE x 4. SKIN: No rashes, warm and dry. No obvious skin lesions. PSYCHIATRIC: Normal mood and behavior.   Assessment/Plan  Assessment & Plan Former smoker  Post Bronchoscopy with biopsies  Biopsy of right upper lobe showed benign bronchial cells with mixed inflammatory changes, no malignant cells or granulomas. Sarcoidosis less likely.   Inflammatory changes could be due to interstitial lung disease or  RA - Referred back to Dr. Geronimo for further evaluation and management of ILD. - Messaged Dr. Geronimo regarding biopsy results showing inflammatory changes without granulomas. - Follow up imaging per Dr. Geronimo with ILD progression scans.   Chronic obstructive pulmonary disease Moderate COPD with obstructive airway disease and diffusion deficit.  Symptoms include shortness of breath and cough, exacerbated by exertion.  Pulmonary function testing shows moderate obstructive changes.  Current treatment includes Anoro and albuterol , providing some relief. - Continue Anoro and albuterol  as prescribed. - Monitor for fever and worsening green secretions, and seek medical attention if these occur.  Interstitial lung disease with pulmonary fibrosis Traction bronchiectasis consistent with fibrosis on CT scan.  Pulmonary function testing shows diffusion deficit, indicating fibrotic changes. - Referred to Dr. Geronimo for further evaluation and management.  Sarcoidosis Current biopsy did not show granulomas, making sarcoidosis less likely.  - Continue to monitor for symptoms of inflammatory  flare. - Avoid chronic prednisone  use due to potential side effects. - Further evaluation of etiology of ILD  I spent 35 minutes dedicated to the care of this patient on the date of this encounter to include pre-visit review of records, face-to-face time with the patient discussing conditions above, post visit ordering of testing, clinical documentation with the electronic health record, making appropriate referrals as documented, and communicating necessary information to the patient's healthcare team.      Lauraine JULIANNA Lites, NP 01/18/2024  9:19 AM

## 2024-01-23 NOTE — Assessment & Plan Note (Signed)
 Chronic, she is currently on Janumet. Importance of dietary and medication compliance was discussed with the patient. LDL goal is less than 70, she will continue with atorvastatin 80mg  daily and ezetimibe 10mg  daily. She is encouraged to follow heart healthy lifestyle.

## 2024-01-23 NOTE — Assessment & Plan Note (Signed)
 CT scan showed central intralobular nodularity. Symptoms include shortness of breath and coughing. Improvement with antibiotics. - Sent prescription for albuterol  nebules to pharmacy. - Await biopsy and culture results.

## 2024-01-23 NOTE — Assessment & Plan Note (Signed)
-   We will continue aspirin, Plavix, Imdur and beta-blocker therapy as well as statin therapy.

## 2024-01-23 NOTE — Assessment & Plan Note (Signed)
 Currently on antibiotics for UTI. - Continue current antibiotic regimen.

## 2024-01-23 NOTE — Assessment & Plan Note (Signed)
 Chronic, well controlled. Importance of medication compliance was discussed with the patient. Encouraged to follow low sodium diet. No med changes today.

## 2024-01-25 ENCOUNTER — Encounter: Payer: Self-pay | Admitting: Internal Medicine

## 2024-02-01 ENCOUNTER — Ambulatory Visit: Payer: Self-pay | Admitting: Emergency Medicine

## 2024-02-06 NOTE — Progress Notes (Signed)
 Eos slightly high, IgE slight high. Will discuss Jan 2026 visit

## 2024-02-16 LAB — FUNGUS CULTURE WITH STAIN

## 2024-02-16 LAB — FUNGAL ORGANISM REFLEX

## 2024-02-16 LAB — FUNGUS CULTURE RESULT

## 2024-02-17 ENCOUNTER — Encounter: Payer: Self-pay | Admitting: Emergency Medicine

## 2024-02-17 ENCOUNTER — Ambulatory Visit: Attending: Emergency Medicine | Admitting: Emergency Medicine

## 2024-02-17 VITALS — BP 124/80 | HR 69 | Ht 62.0 in | Wt 163.0 lb

## 2024-02-17 DIAGNOSIS — I6523 Occlusion and stenosis of bilateral carotid arteries: Secondary | ICD-10-CM | POA: Diagnosis present

## 2024-02-17 DIAGNOSIS — Z87898 Personal history of other specified conditions: Secondary | ICD-10-CM | POA: Insufficient documentation

## 2024-02-17 DIAGNOSIS — E785 Hyperlipidemia, unspecified: Secondary | ICD-10-CM | POA: Insufficient documentation

## 2024-02-17 DIAGNOSIS — I10 Essential (primary) hypertension: Secondary | ICD-10-CM | POA: Diagnosis present

## 2024-02-17 DIAGNOSIS — I2510 Atherosclerotic heart disease of native coronary artery without angina pectoris: Secondary | ICD-10-CM | POA: Insufficient documentation

## 2024-02-17 NOTE — Progress Notes (Signed)
 " Cardiology Office Note:    Date:  02/17/2024  ID:  Sheila Reynolds, DOB 03-10-1945, MRN [id number] PCP: [name], MD  [facility] Providers Cardiologist:  Annabella Scarce, MD       Patient Profile:       Chief Complaint: Acute visit for hypertension  History of Present Illness:  Sheila Reynolds is a 79 y.o. female with visit-pertinent history of coronary artery disease s/p orbital arthrectomy and PCI/DES to proximal/mid LAD, hyperlipidemia, palpitations, NSVT, sarcoidosis, pulmonary fibrosis  L/RHC in 12/2016 showed single-vessel coronary artery disease with long calcified proximal/mid LAD stenosis up to 70% and 70% ostial D2 stenosis with mild pulmonary hypertension.  Subsequently underwent arthrectomy on 01/15/2017.  Cardiac PET 03/2022 showed normal LVEF 58%, low risk with no ischemia nor infarction.  Carotid duplex showing bilateral 1-39% stenosis.  Was admitted to the hospital on 10/22 through 12/06/2022 with syncope.  Syncope occurred while standing and eating.  She did have a prodrome of feeling weak/tired.  She did have 2 similar episodes prior she nearly blacked out.  Echo during admission showed LVEF 60 to 65%, no RWMA, no significant valvular abnormalities.  Carotid duplex showed bilateral 1-39% stenosis.  Also treated for UTI.  ZIO placed on 02/2023 due to syncope with predominantly NSR, 6 beat asymptomatic run of SVT, no arrhythmias.  Seen 04/2023 with Dr. Scarce who stated syncope was felt to be consistent with vasovagal syncope.  Carvedilol  was reduced and clopidogrel  was stopped as 1 year since stent placement.  Admitted 6/29 through 08/11/2023 with syncope felt to be related to dehydration in the setting of nausea, vomiting, diarrhea.  Treated for community-acquired pneumonia and UTI.  Carotid duplex 07/2023 showed bilateral 1-39% stenosis.  Echocardiogram 08/2023 showed LVEF 55 to 60%, no RWMA, no significant valvular abnormalities.  She was discharged home with  home health PT.  Last seen in clinic by Reche Finder, NP on 08/2023.  She was stable without anginal symptoms.  She was to follow-up in 6 months.   Discussed the use of AI scribe software for clinical note transcription with the patient, who gave verbal consent to proceed.  History of Present Illness Sheila Reynolds is a 79 year old female who presents with blood pressure issues.  She is accompanied by her husband.  Her blood pressure was markedly elevated during a physical therapy session 2 to 3 months ago, prompting further evaluation.  She reports her blood pressure was high for about 2 weeks after that session reaching up to 190 systolic.  However since then, home and office readings have been in the 120-130/76-84 range. She takes carvedilol  and isosorbide  long term without recent changes.  Over a year ago she had two blackout episodes. She has had no further blackouts, no chest pain, and no stroke-like symptoms since then. Neurology has cleared her to drive, and she now seeks cardiology clearance.   She has unchanged shortness of breath with climbing stairs or walking up the driveway, which she attributes to deconditioning after reduced mobility for 2 years. She has chronic lower extremity lymphedema treated with compression stockings and leg elevation. She has a lymphedema pump but avoids it due to severe cramps.  She denies chest pains, orthopnea, PND, syncope, presyncope, lightheadedness, dizziness   Review of systems:  Please see the history of present illness. All other systems are reviewed and otherwise negative.      Studies Reviewed:    EKG Interpretation Date/Time:  Wednesday February 17 2024 11:28:58  EST Ventricular Rate:  69 PR Interval:  130 QRS Duration:  74 QT Interval:  400 QTC Calculation: 428 R Axis:   -4  Text Interpretation: Normal sinus rhythm Normal ECG When compared with ECG of 09-Aug-2023 08:10, PREVIOUS ECG IS PRESENT Confirmed by Rana Dixon [phone]) on 02/17/2024 12:20:51 PM    Echocardiogram 08/11/2023  1. Apical views could not be obtained. Left ventricular ejection  fraction, by estimation, is 55 to 60%. The left ventricle has normal  function. The left ventricle has no regional wall motion abnormalities.  Left ventricular diastolic function could not  be evaluated.   2. Right ventricular systolic function is normal. The right ventricular  size is normal.   3. The mitral valve is normal in structure. No evidence of mitral valve  regurgitation. No evidence of mitral stenosis.   4. The aortic valve is normal in structure. Aortic valve regurgitation is  not visualized. No aortic stenosis is present.   5. The inferior vena cava is normal in size with greater than 50%  respiratory variability, suggesting right atrial pressure of 3 mmHg.   Carotid duplex 08/10/2023 Right Carotid: Velocities in the right ICA are consistent with a 1-39%  stenosis.   Left Carotid: Velocities in the left ICA are consistent with a 1-39%  stenosis.   Vertebrals: Bilateral vertebral arteries demonstrate antegrade flow.   Zio 02/23/2023   Predominant rhythm is sinus rhythm.  She had 1 episode of SVT which lasted 6 beats with a maximum heart rate of 154 and an average heart rate of 143.   Rare premature atrial contractions, rare premature ventricular contractions   No triggered events are present   There were serious arrhythmias.     Patch Wear Time:  11 days and 23 hours (2025-01-13T09:06:17-0500 to 2025-01-25T08:24:25-0500)   Patient had a min HR of 58 bpm, max HR of 154 bpm, and avg HR of 84 bpm. Predominant underlying rhythm was Sinus Rhythm. 1 run of Supraventricular Tachycardia occurred lasting 6 beats with a max rate of 154 bpm (avg 143 bpm). Isolated SVEs were rare  (<1.0%), SVE Couplets were rare (<1.0%), and SVE Triplets were rare (<1.0%). Isolated VEs were rare (<1.0%), VE Couplets were rare (<1.0%), and no VE Triplets were present.    Echocardiogram 12/04/2022  1. Left ventricular ejection fraction, by estimation, is 60 to 65%. The  left ventricle has normal function. The left ventricle has no regional  wall motion abnormalities. Left ventricular diastolic function could not  be evaluated.   2. Right ventricular systolic function is normal. The right ventricular  size is normal.   3. The mitral valve is normal in structure. No evidence of mitral valve  regurgitation. No evidence of mitral stenosis.   4. The aortic valve is normal in structure. Aortic valve regurgitation is  not visualized. No aortic stenosis is present.   5. The inferior vena cava is normal in size with greater than 50%  respiratory variability, suggesting right atrial pressure of 3 mmHg.   Cardiac PET/CT 03/18/2022   The study is normal. The study is low risk.   LV perfusion is normal. There is no evidence of ischemia. There is no evidence of infarction.   Rest left ventricular function is normal. Rest EF: 58 %. Stress left ventricular function is normal. Stress EF: 61 %. End diastolic cavity size is normal. End systolic cavity size is normal. No evidence of transient ischemic dilation (TID) noted.   Myocardial blood flow was computed to be  1.16ml/g/min at rest and 2.44ml/g/min at stress. Global myocardial blood flow reserve was 2.10 and was normal.   Coronary calcium  assessment not performed due to prior revascularization.   Electronically signed by: Soyla DELENA Merck, MD  Cardiac catheterization 01/15/2017 Conclusions: 70% proximal/mid LAD stenosis (previously found to be hemodynamically significant with resting Pd/Pa of 0.79). 70% ostial D2 stenosis. Successful orbital atherectomy and PCI using Xience Sierra 3.5 x 33 mm DES with 10% proximal stenosis (post-dilated with 4.0 [place] balloon at high pressure) and TIMI-3 flow. Successful PTCA to ostial D2 (including kissing balloon inflation) with reduction in stenosis to 50% and TIMI-3 flow.    Recommendations: Dual antiplatelet therapy with aspirin  and clopidogrel  for at least 6 months, ideally longer. Aggressive secondary prevention. Overnight observation and cardiac rehab after discharge.  Diagnostic Dominance: Co-dominant  Intervention   Risk Assessment/Calculations:              Physical Exam:   VS:  BP 124/80 (BP Location: Left Arm, Patient Position: Sitting, Cuff Size: Normal)   Pulse 69   Ht 5' 2 (1.575 m)   Wt 163 lb (73.9 kg)   BMI 29.81 kg/m    Wt Readings from Last 3 Encounters:  02/17/24 163 lb (73.9 kg)  01/18/24 166 lb (75.3 kg)  01/14/24 167 lb 12.8 oz (76.1 kg)    GEN: Well nourished, well developed in no acute distress NECK: No JVD; No carotid bruits CARDIAC: RRR, no murmurs, rubs, gallops RESPIRATORY:  Clear to auscultation without rales, wheezing or rhonchi  ABDOMEN: Soft, non-tender, non-distended EXTREMITIES:  No edema; No acute deformity      Assessment and Plan:  Hypertension Blood pressure today is well-controlled at 124/80 Home blood pressure readings have been stable - Continue carvedilol  12.5 mg twice daily and isosorbide  60 mg daily  Coronary artery disease S/p orbital arthrectomy and PCI/DES to proximal/mid LAD in 2018 Cardiac PET 03/2022 with normal LVEF, low risk, with no ischemia nor infarction - Today patient is stable without anginal symptoms.  No indication for ischemic evaluation at this time - EKG today without acute ischemic features - Continue aspirin  81 mg daily, atorvastatin  80 mg daily, carvedilol  12.5 mg twice daily, ezetimibe  10 mg daily, isosorbide  60 mg daily  Hyperlipidemia, LDL goal <70 LDL 78 07/2023 - LDL near goal.  Emphasized heart healthy dieting and daily physical exercise - Continue atorvastatin  80 mg daily and ezetimibe  10 mg daily  History of syncope Episodes were consistent with vasovagal syncope due to parasympathetic response causing hypotension and bradycardia Echocardiogram, ZIO, and  carotid duplex were overall unremarkable - No further episodes of syncope.  No syncope, lightheadedness, dizziness - Participating in physical therapy for balance and strengthening exercises currently - Has been greater than 6 months without further episodes of syncope.  Okay to drive from a cardiovascular standpoint  Carotid stenosis Carotid duplex 07/2023 with bilateral 1-39% stenosis - Today she is without new neurological symptoms - Continue ezetimibe  10 mg daily and atorvastatin  40 mg daily  COPD Pulmonary function testing showed moderate obstructive changes - No recent exacerbations.  Managed by pulmonology  Interstitial lung disease with pulmonary fibrosis Recent biopsy did not show granulomas, making sarcoidosis less likely - Has been referred to Dr. Geronimo for further evaluation and management of ILD      Dispo:  Return in about 6 months (around 08/16/2024).  Signed, Lum LITTIE Louis, NP  "

## 2024-02-17 NOTE — Patient Instructions (Signed)
 Medication Instructions:  NO CHANGES   Lab Work: NONE TO BE DONE TODAY.  Testing/Procedures: NONE  Follow-Up: At [facility], you and your health needs are our priority.  As part of our continuing mission to provide you with exceptional heart care, our providers are all part of one team.  This team includes your primary Cardiologist (physician) and Advanced Practice Providers or APPs (Physician Assistants and Nurse Practitioners) who all work together to provide you with the care you need, when you need it.  Your next appointment:  6 MONTHS  Provider:   Annabella Scarce, MD

## 2024-02-23 ENCOUNTER — Ambulatory Visit: Admitting: Internal Medicine

## 2024-02-23 ENCOUNTER — Encounter: Payer: Self-pay | Admitting: Internal Medicine

## 2024-02-23 VITALS — BP 124/74 | HR 89 | Ht 62.0 in | Wt 164.0 lb

## 2024-02-23 DIAGNOSIS — R918 Other nonspecific abnormal finding of lung field: Secondary | ICD-10-CM | POA: Diagnosis not present

## 2024-02-23 DIAGNOSIS — R0609 Other forms of dyspnea: Secondary | ICD-10-CM | POA: Diagnosis not present

## 2024-02-23 DIAGNOSIS — Z9889 Other specified postprocedural states: Secondary | ICD-10-CM

## 2024-02-23 DIAGNOSIS — G4733 Obstructive sleep apnea (adult) (pediatric): Secondary | ICD-10-CM

## 2024-02-23 DIAGNOSIS — M549 Dorsalgia, unspecified: Secondary | ICD-10-CM

## 2024-02-23 DIAGNOSIS — J849 Interstitial pulmonary disease, unspecified: Secondary | ICD-10-CM

## 2024-02-23 DIAGNOSIS — Z87891 Personal history of nicotine dependence: Secondary | ICD-10-CM

## 2024-02-23 DIAGNOSIS — D869 Sarcoidosis, unspecified: Secondary | ICD-10-CM

## 2024-02-23 NOTE — Progress Notes (Addendum)
 "     OCT 2023 [name]  She is a patient of Dr. 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  She never had a biopsy done and was treated intermittently with prednisone .  She has been told of late that the sarcoidosis is in remission.  Her recent CT scans have demonstrated pulmonary fibrosis and she has been referred here for further evaluation  Complains of intermittent chest tightness and pain.  Has dyspnea on exertion.  Denies any cough, congestion, sputum production  Pets: Dogs Occupation: Retired it consultant Exposures: No recent mold exposure from a leak in 2022.  No ongoing exposures.  No mold, hot tub, Jacuzzi.  No feather pillows or comforters ILD questionnaire 09/30/2021-negative Smoking history: Quit smoking in 1983 Travel history: No significant travel history Relevant family history: No family history of lung disease.  Interim history: Here for review of PFTs.  States that breathing is stable  Received note from Dr. 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  I agreed with the plan and communicated this with Dr. 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  She does have peribronchovascular nodularity in the upper lobe suggestive of sarcoidosis.  No connective tissue disease serologies or exposures noted.  Sarcoidosis can cause pulmonary fibrosis in a similar pattern.  Alternatively IPF can coexist with sarcoidosis.  Her interstitial lung disease has been stable on CT scans dating back several years.  We discussed further work-up and possible biopsy.  Given stability we will monitor for now PFTs reviewed.  She has been started on azathioprine  by Dr. Aryal for inflammatory arthritis which may be related to  sarcoid.  I have discussed this with him and agree with the treatment as it may be beneficial for the lungs to Follow-up with CT scan in 6 months  Plan/Recommendations: High-resolution CT in 6 months xxxxx 11/24/22-79 yo female former smoker (4 pkyrs) followed for COPD/bronchiolitis/lung nodules, Ocular Sarcoid, ILD/UIP, OSA, complicated by GERD, Glaucoma, DM 2, CAD/ stent, HTN, DDD lumbar spine, Covid infection April 2022, ASCVD/ Aortic, CAD, Lymphedema, Covid infection,  -Singulair , Neb albutr ILD- possible UIP and / or Sarcoid, stable over recent years.  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 Pulmonary Rehab- done               ED for Covid 01/30/22> benzonatate  CPAP  Luna- 5-15 / [employer]    Saw Dr Theophilus for replacement [id number] Download compliance- CPAP used 2-3 days ago. Body weight today-166 lbs Not driving for 6 months after possible seizure, Sputum cultures from June all nl flora/ yeast, neg AFB. Took doxy after these cultures obtained. Had Flu vax Still some night sweats, productive cough, DOE but not worse. She is aware memory not good- forgets CPAP- discussed. Had flu vax. CT chest 07/25/22- IMPRESSION: 1. New irregular solid nodule of the right lung apex, dominant nodule described on prior exam has resolved, and new clustered small solid nodules are seen in the posterior right upper lobe. Findings are likely due to chronic atypical infection. Recommend continued follow-up based on patient risk factors. 2. 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 Am JINNY Honey Crit Care Med Vol 198, Iss  5, [id number], Oct 11 2016. 3. Aortic Atherosclerosis (ICD10-I70.0) and Emphysema (ICD10-J43.9).   OV 111@4  - [id number]    Patient ID: EMMALEIGH LONGO, female    DOB:  09/02/1945, 79 y.o.   MRN: [id number]  HPI The patient is a 79 year old former smoker with a history of COPD, sarcoidosis, diabetes, hypertension, hyperlipidemia, and obstructive sleep apnea (OSA) on CPAP. She was recently hospitalized following an episode of syncope and head trauma, during which no seizure activity was observed. A urinary tract infection was identified and treated with Augmentin  during the hospitalization. A CT pulmonary angiogram (CTPA) performed during the hospital stay revealed a speculated incavitary nodule in the right upper lobe, measuring 1.4 by 2.4 cm, which was slightly larger than on a previous CT chest. The patient has been under the care of Dr. Neysa for her sarcoidosis.  The patient's sarcoidosis was diagnosed based on ocular manifestations and a family history of the condition, although no lung tests were performed. She has been on various medications for the condition, including methotrexate  and steroids, and is currently on azathioprine , which was started less than a year ago. The patient also has a history of arthritis.  The patient experienced a blackout and head trauma, which did not break the skin. A neurologist diagnosed her with a concussion. She has been experiencing numbness on one side of her face and inside her mouth for the past two to three months, which has been evaluated by a dentist with no clear cause identified. The patient's daughter also attended the consultation.   OV 02/23/2023 visit with Dr. Geronimo crawford mistake in scheduling but ultimately decided to stick with Dr. Geronimo for fibrosis]  Subjective:  Patient ID: Alejandra VEAR Goldberg, female , DOB: 08-12-1945 , age 59 y.o. , MRN: [id number] , ADDRESS: [address] [place] [address] PCP [name], MD Patient Care Team: [name], MD as PCP - General (Internal Medicine) Raford Riggs, MD as PCP - Cardiology (Cardiology) [name], [name], MD as Consulting Physician  (Cardiology) Arnell Rockney PARAS, [facility] (Inactive) (Pharmacist)  This Provider for this visit: Treatment Team:  Attending Provider: Geronimo Amel, MD    02/23/2023 -   Chief Complaint  Patient presents with   Follow-up    Pt states she has been more sob, currently having sinus issues. Using albuterol  and anoro      HPI [name] H Moose 79 y.o. -presents for follow-up.  When I walked in she and husband but under the impression they are going to see Dr. Lamar Chris for the lung nodule.  There is no clear-cut referral from Dr. Chris or Dr. Reggy Neysa to see me.  So history is gained by talking to the patient, husband on also later chart review after she left the office.  It appears that she has sleep apnea for which she is follows with Dr. Neysa and she uses nighttime CPAP without oxygen .  Late in 2024 she got diagnosed with a cavitary spiculated nodule.  She was referred to [facility] by them did a follow-up CT scan of the chest that was read out on Christmas Day 2024.  The nodule spiculated nodule resolved.  She has some chronic changes of sarcoidosis.  She is supposed to see Dr. Byrum for this visit but for unclear reasons she is with me.  Then I reviewed the chart and realized that she has seen Dr. Theophilus twice in 2023 for ILD.  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  Back  in 2023 she was started on azathioprine  for her inflammatory arthritis related to sarcoid.  As of 2023 her CT scan and pulmonary function test was deemed stable.  Looks like she never followed up back with Dr. Theophilus.  At this point in time she is on Anoro and Singulair  but I do not see azathioprine  or any immunomodulators on her medication list.  She had a ED visit 02/09/2023 for acute cystitis with hematuria.  She did see Reche Finder cardiology PA today.  She reported to her chronic severe fatigue.  She describes the same to me has been going on for many years.  Review of the records indicate  that she has had his ILD.  Most recent CT chest and compared to 2018 all looks stable to me.  At least in the radiology reports she has been stable between 2022 and 2024 with a probable UIP.  She said she had lab work with cardiology today but at the time of this dictation I do not see this.  However end of October 2024 she had a slightly high creatinine 1.01 mg percent slightly high AST.  She has negative serology in 2023 except for trace positive ANA 1: 40.  Echocardiogram October 2024 ejection fraction 65%.  Sit/stand test today pulse ox drop from 100% to 93% after sitting and standing 15 times.  Heart rate ranged from 86 to 102/min.  Myocardial perfusion test normal in 2021  Last set of pulmonary function test November 2024 although the [facility] is declining the TLC has been stable for years.  To me the radiology images ILD is also stable.  Follow-up: I discussed with Dr. Reggy and patient has an appointment with him for sleep apnea.  He said this could be deferred for 6 months.  Did indicate to her that the nodule she saw Dr. Shelah for is resolved..  Therefore I will see her for ILD and she is agreed to that.      CLINICAL DATA:  Follow-up lung nodule   EXAM: CT CHEST WITHOUT CONTRAST   TECHNIQUE: Multidetector CT imaging of the chest was performed following the standard protocol without IV contrast.   RADIATION DOSE REDUCTION: This exam was performed according to the departmental dose-optimization program which includes automated exposure control, adjustment of the mA and/or kV according to patient size and/or use of iterative reconstruction technique.   COMPARISON:  CTA chest dated 12/03/2022   FINDINGS: Cardiovascular: Normal heart size. No significant pericardial fluid/thickening. Great vessels are normal in course and caliber. Coronary artery calcifications. LAD coronary stent. Coronary artery calcifications and aortic atherosclerosis.   Mediastinum/Nodes: Imaged  thyroid  gland without nodules meeting criteria for imaging follow-up by size. Normal esophagus. No pathologically enlarged axillary, supraclavicular, mediastinal, or hilar lymph nodes. Unchanged prominent subcentimeter left internal mammary lymph nodes measuring up to 3 mm (2:37).   Lungs/Pleura: The central airways are patent. Scattered changes of centrilobular emphysema. Similar upper lobe bronchiectasis. Similar lower lung predominant fibrotic changes. Again seen are waxing and waning tree-in-bud and clustered nodular changes in the right apex, now measuring up to 6 x 5 mm (8:26). Previously noted spiculated cavitary nodule is no longer seen. No pneumothorax. No pleural effusion.   Upper abdomen: Normal.   Musculoskeletal: No acute or abnormal lytic or blastic osseous lesions. Bilateral breast implants.   IMPRESSION: 1. Waxing and waning tree-in-bud and clustered nodular changes in the right apex, now measuring up to 6 x 5 mm. Previously noted spiculated cavitary nodule is no longer seen.  Findings are favored to represent a chronic atypical infectious process. Recommend continued follow-up based on patient risk factors. 2. Similar lower lung predominant fibrotic changes. 3. Aortic Atherosclerosis (ICD10-I70.0) and Emphysema (ICD10-J43.9). Coronary artery calcifications. Assessment for potential risk factor modification, dietary therapy or pharmacologic therapy may be warranted, if clinically indicated.     Electronically Signed   By: Limin  Xu M.D.   On: 02/04/2023 21:37   01/18/2024 Discussed the use of AI scribe software for clinical note transcription with the patient, who gave verbal consent to proceed.   History of Present Illness Pt. Presents for follow up after bronchoscopy with biopsies on 01/11/2024. Biopsy was done to better evaluate waxing and waning pulmonary lung nodules . Suspicion for malignancy was lower given the waxing and waning nature of the nodules. Plan  was for biopsy of a right upper lobe to rule out malignancy and possibly rule in inflammatory process like sarcoid. She does use an albuterol  inhaler a few times a week. She also uses an Anoro Inhaler Daily.   She states she did well after her bronchoscopy with biopsies. No bleeding, fever, discolored secretions or worsening dyspnea than baseline . No adverse reaction to anesthesia, although she did state she was sleepy for a few days.    We have reviewed the results of her biopsy. The biopsy results showed benign bronchial cells with mixed inflammation but no malignant cells or granulomatous changes. We discussed that this biopsy did not confirm sarcoidosis as there were no granulomas present.    She has a history of sarcoidosis and ILD and was treated with prednisone  during a previous respiratory flare-up. She is confused about her diagnosis, as previous doctors attributed her symptoms to sarcoidosis, but the recent biopsy did not confirm this. I explained that it may be we just didn't get any granulomatous cells with the biopsy. We also discussed that this may be related to other autoimmune issues. She also has a history of Rheumatoid Arthritis, which can also cause pulmonary nodules.    She has a history of COPD and has undergone pulmonary function tests. She experiences shortness of breath and cough, which have improved with the use of inhalers such as albuterol  and Anoro. Despite these treatments, she continues to experience some cough, however it is better than it has been in the past. She endorses shortness of breath. She quit smoking over 40 years ago. The non malignant diagnosis is reassuring.    She reports experiencing excruciating pain that radiates from her right front to her back, which is exacerbated by bending over and is associated with shortness of breath. This pain has become more frequent. She has discussed this with her primary care doctor, but no definitive cause has been  identified.   She has a history of rheumatoid arthritis and has been treated with azathioprine , which she finds somewhat helpful for pain. She previously tried methotrexate  but discontinued it due to side effects. She also experiences reflux, which she is treating. She continues to follow up with rheumatology.   Plan will be for follow up with Dr. Geronimo to further evaluate cause, and for treatment  of her ILD.       Test Results: 01/11/2024 Cytology A. LUNG, RUL NODULE, NEEDLE ASPIRATION  BIOPSY:  - No malignant cells identified  - Benign bronchial cells and extensive mixed inflammation   B. LUNG, RUL NODULE, BRUSHING:  - No malignant cells identified  - Benign bronchial cells and extensive mixed inflammation    01/11/2024 BAL  No Growth   01/11/2024 Fungal Pending   PFT  OV 07/31/2023  Subjective:  Patient ID: Alejandra VEAR Goldberg, female , DOB: 1945-10-15 , age 12 y.o. , MRN: [id number] , ADDRESS: [address] [place] [address] PCP [name], MD Patient Care Team: [name], MD as PCP - General (Internal Medicine) Raford Riggs, MD as PCP - Cardiology (Cardiology) [name], [name], MD as Consulting Physician (Cardiology) Arnell Rockney PARAS, RPH (Inactive) (Pharmacist) Morgan Clayborne CROME, RN as [facility] Care Management  This Provider for this visit: Treatment Team:  Attending Provider: Geronimo Amel, MD    07/31/2023 -   Chief Complaint  Patient presents with   Follow-up    She states has had sinus infection for approx 3 wks. She has a prod cough with yellow sputum, SOB and wheezing. Denies any fevers/aches.      HPI Laurisa Sahakian Khatoon 79 y.o. -presents for follow-up.  Presents with her sister in law.  Who lives [place].  Sister's name is Gwenlyn.  She reports that appointment 3-4 weeks ago she was feeling fine.  She then picked up sinus infection/congestion.  [facility] told [facility] infection gave her Augmentin   for 7 days but she was not better.  She then followed up with primary care but was still feeling congested.  She went back to primary care they gave her doxycycline  for 7 days that she finished yesterday.  She is been referred to PCP but not been able to see PCP as yet.  She is not feeling well continues to wheeze sputum is still brown and yellow [baseline is clear] more winded also wheezing.  Sinuses definitely feeling congested.  She was told she needs some blood work.  I will get IgE and CBC with differential and chemistry panel.  In terms of her sarcoid/ILD: She had pulmonary function test and it shows 5 to 7 years stability  In terms of her waxing and waning multiple pulmonary nodules: I wanted to present this to the case conference but because of radiology shortage this has not been done so.  But I reassured of the waxing and waning nodules lower the probability for cancer and that we would get another CT scan in 3 months which would be a 45-month follow-up.  She is agreeable with this plan.     01/18/2024 Discussed the use of AI scribe software for clinical note transcription with the patient, who gave verbal consent to proceed.  History of Present Illness Pt. Presents for follow up after bronchoscopy with biopsies on 01/11/2024. Biopsy was done to better evaluate waxing and waning pulmonary lung nodules . Suspicion for malignancy was lower given the waxing and waning nature of the nodules. Plan was for biopsy of a right upper lobe to rule out malignancy and possibly rule in inflammatory process like sarcoid. She does use an albuterol  inhaler a few times a week. She also uses an Anoro Inhaler Daily.  She states she did well after her bronchoscopy with biopsies. No bleeding, fever, discolored secretions or worsening dyspnea than baseline . No adverse reaction to anesthesia, although she did state she was sleepy for a few days.   We have reviewed the results of her biopsy. The biopsy results  showed benign bronchial cells with mixed inflammation but no malignant cells or granulomatous changes. We discussed that this biopsy did not confirm sarcoidosis as there were no granulomas present.   She has a history of sarcoidosis and ILD  and was treated with prednisone  during a previous respiratory flare-up. She is confused about her diagnosis, as previous doctors attributed her symptoms to sarcoidosis, but the recent biopsy did not confirm this. I explained that it may be we just didn't get any granulomatous cells with the biopsy. We also discussed that this may be related to other autoimmune issues. She also has a history of Rheumatoid Arthritis, which can also cause pulmonary nodules.    She has a history of COPD and has undergone pulmonary function tests. She experiences shortness of breath and cough, which have improved with the use of inhalers such as albuterol  and Anoro. Despite these treatments, she continues to experience some cough, however it is better than it has been in the past. She endorses shortness of breath. She quit smoking over 40 years ago. The non malignant diagnosis is reassuring.   She reports experiencing excruciating pain that radiates from her right front to her back, which is exacerbated by bending over and is associated with shortness of breath. This pain has become more frequent. She has discussed this with her primary care doctor, but no definitive cause has been identified.  She has a history of rheumatoid arthritis and has been treated with azathioprine , which she finds somewhat helpful for pain. She previously tried methotrexate  but discontinued it due to side effects. She also experiences reflux, which she is treating. She continues to follow up with rheumatology.  Plan will be for follow up with Dr. Geronimo to further evaluate cause, and for treatment  of her ILD.    OV 02/23/2024  Subjective:  Patient ID: Alejandra VEAR Goldberg, female , DOB: [birth year] , age 22  y.o. , MRN: [id number] , ADDRESS: [address] [place] [address] PCP [name], MD Patient Care Team: [name], MD as PCP - General (Internal Medicine) Raford Riggs, MD as PCP - Cardiology (Cardiology) [name], [name], MD (Inactive) as Consulting Physician (Cardiology) Pearson, Vallie J, [facility] (Inactive) (Pharmacist)  This Provider for this visit: Treatment Team:  Attending Provider: Geronimo Amel, MD    02/23/2024 -   Chief Complaint  Patient presents with   Medical Management of Chronic Issues   Interstitial Lung Disease    Breathing has been worse x 2 wks. She gets winded walking up one flight of stairs.      HPI Fronie H Linford 79 y.o. -Gracieann H Brealynn Contino is a 79 year old female with pulmonary fibrosis (MILD NOS) and lung nodules who presents with worsening shortness of breath and fatigue. She reports: She has a history of family deaths and personal stress, contributing to her anxiety. She mentions the recent death of her last uncle and ongoing family problems, including issues with her children.  The main issue is further followup of the 01/11/24 lung nodule biopsy. A recent biopsy of the RUL lung nodules did not show cancer cells, but did show inflammatory cells.  She took this as negative for cancer and negative for sarcoid.  I did indicate her all that this biopsy results mean nondiagnostic.  She said she was confused and possible by it.  I went over this a few times and explained the concept of known diagnosis.  However the culture indicate the presence of mycobacteria.  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  Her last QuantiFERON gold was in 2019 negative.  The DNA edification and this does not seem consistent with MTB.  However speciation is pending.  I did indicate to her that she needs to be referred to  infectious diseases.  After explanation she was aligned  In terms of ILD the December 2025 CT does show this.  It is mild.  It is stable.  She thought the biopsy ruled  out ILD.  I did indicate to it was a transbronchial biopsy and was not a biopsy looking at ILD.  I did show her the presence of ILD it is very mild and stable.  She then expressed understanding..  Other issues - Diagnose of sarcoid: She said she had a diagnose of sarcoidosis for 40 years.  She says the biopsies did not show that.  She is really puzzled by this and she said how was that possible.  I did explain to her that sarcoid can burnout over many years.  And she has a different problem right now possibly.    Symptoms:   - Respiratory: Significant class III dyspnea on exertion.  She also has cough and fatigue.  Fatigue is in #1 problem with shortness of breath #2 and cough #3. Over the past six months, she has experienced worsening shortness of breath, particularly during exertion such as climbing stairs or walking long distances. These episodes have become more severe compared to six months ago. No chest pain, palpitations, or dizziness accompany these episodes. Fatigue is her most significant symptom, stating she 'stays tired all the time' and has 'no energy.' She ranks fatigue as her primary concern, followed by shortness of breath and then coughing. She denies any knee, hip, or back problems affecting her mobility, although she mentions having a 'bad knee.'  She is willing to attend pulmonary rehabilitation.  -Atypical left upper back intrascapular pain She experiences back pain that starts in the back and radiates to the front, located in the bra area, but she is unsure if it is related to her lung condition. She does not have pain on the right side where the nodule is located.  Indicated her the nodule is on the right side and I did express to her that I was not certain as to the cause of the pain.        SYMPTOM SCALE - ILD 02/23/2023 02/23/2024   Current weight  r  O2 use ra ra  Shortness of Breath 0 -> 5 scale with 5 being worst (score 6 If unable to do)   At rest 4 2  Simple tasks -  showers, clothes change, eating, shaving 5 3  Household (dishes, doing bed, laundry) 4 4  Shopping 4 3  Walking level at own pace 4 3  Walking up Stairs 5 4  Total (30-36) Dyspnea Score 26 19  How bad is your cough? 3 3  How bad is your fatigue 4 3  How bad is nausea 0 2  How bad is vomiting?  0 1  How bad is diarrhea? 3 0  How bad is anxiety? 3 4  How bad is depression 2 4  Any chronic pain - if so where and how bad 0 0    [facility] 12/1/225  01/11/24 08:25  Acid Fast Culture with reflexed sensitivities Rpt ! (C)  Acid Fast Culture Positive !  Acid Fast ID by PCR and Susceptibilities Rpt !  !: Data is abnormal (C): Corrected Rpt: View report in Results Review for more information     Test Results: 01/11/2024 Cytology A. LUNG, RUL NODULE, NEEDLE ASPIRATION  BIOPSY:  - No malignant cells identified  - Benign bronchial cells and extensive mixed inflammation   B. LUNG, RUL NODULE,  BRUSHING:  - No malignant cells identified  - Benign bronchial cells and extensive mixed inflammation   01/11/2024 BAL  No Growth  01/11/2024 Fungal Pending  06/29/2023 PFT: FVC 65, FEV1 64, ratio 73, DLCO 51 08/11/2023 echo: EF 55-60%. RV size and function normal.  12/04/2023 Super D CT chest: focal area of architectural distortion with nodular thickening and mixed solid/cystic in right apex with decreased nodularity, favoring inflammatory change. Stable RUL nodularity with traction btx consistent with fibrosis, stable. Mild emphysema. Atherosclerosis.    PFT     Latest Ref Rng & Units 06/29/2023    8:59 AM 01/05/2023   10:31 AM 11/27/2021    1:16 PM 04/13/2020   10:06 AM 11/25/2016    4:11 PM  PFT Results  FVC-Pre L 1.70  1.49  1.67  1.66  1.70   FVC-Predicted Pre % 65  57  63  79  78   FVC-Post L  1.53  1.60  1.59  1.69   FVC-Predicted Post %  59  61  76  78   Pre FEV1/FVC % % 73  81  77  77  81   Post FEV1/FCV % %  79  80  81  83   FEV1-Pre L 1.24  1.20  1.29  1.27  1.39    FEV1-Predicted Pre % 64  62  66  79  82   FEV1-Post L  1.21  1.28  1.28  1.40   DLCO uncorrected ml/min/mmHg 9.11  10.23  9.64  9.27  9.95   DLCO UNC% % 51  57  53  51  46   DLCO corrected ml/min/mmHg  10.72  10.38  9.27  10.18   DLCO COR %Predicted %  60  57  51  47   DLVA Predicted % 79  100  87  80  88   TLC L  2.89  3.24  3.52  3.29   TLC % Predicted %  60  68  73  69   RV % Predicted %  57  71  85  75        LAB RESULTS last 96 hours No results found.       has a past medical history of Anemia, Anxiety, Arthritis, Asthma, Bronchitis with emphysema, Chest pain, Chronic bronchitis (HCC), Coronary artery disease, Family history of anesthesia complication, Fibromyalgia, GERD (gastroesophageal reflux disease), Glaucoma, Heart murmur, History of hiatal hernia, echocardiogram, Hyperlipidemia, Hypertension, Nausea vomiting and diarrhea (08/24/2023), Nonsustained ventricular tachycardia (HCC), OSA on CPAP, Premature atrial contractions, PVC (premature ventricular contraction), Sarcoidosis, TIA (transient ischemic attack), and Type II diabetes mellitus (HCC).   reports that she quit smoking about 44 years ago. Her smoking use included cigarettes. She started smoking about 48 years ago. She has a 4 pack-year smoking history. She has never used smokeless tobacco.  Past Surgical History:  Procedure Laterality Date   BREAST SURGERY     BRONCHIAL BRUSHINGS  01/11/2024   Procedure: BRONCHOSCOPY, WITH BRUSH BIOPSY;  Surgeon: Shelah Lamar RAMAN, MD;  Location: [facility];  Service: Pulmonary;;   BRONCHIAL NEEDLE ASPIRATION BIOPSY  01/11/2024   Procedure: BRONCHOSCOPY, WITH NEEDLE ASPIRATION BIOPSY;  Surgeon: Shelah Lamar RAMAN, MD;  Location: [facility];  Service: Pulmonary;;   CARDIAC CATHETERIZATION  05/04/2007   reveals overall normal left ventricular systolic function. Ejection fraction 65-70%   CATARACT EXTRACTION W/PHACO Right 07/20/2013   Procedure: CATARACT EXTRACTION PHACO AND  INTRAOCULAR LENS PLACEMENT (IOC);  Surgeon: Gaither Quan, MD;  Location:  [facility] OR;  Service: Ophthalmology;  Laterality: Right;   COLONOSCOPY W/ BIOPSIES AND POLYPECTOMY     COLONOSCOPY WITH PROPOFOL  N/A 09/07/2014   Procedure: COLONOSCOPY WITH PROPOFOL ;  Surgeon: Renaye Sous, MD;  Location: [facility];  Service: Endoscopy;  Laterality: N/A;   COLONOSCOPY WITH PROPOFOL  N/A 01/10/2020   Procedure: COLONOSCOPY WITH PROPOFOL ;  Surgeon: Sous Renaye, MD;  Location: [facility];  Service: Endoscopy;  Laterality: N/A;   CORONARY ANGIOPLASTY WITH STENT PLACEMENT  01/15/2017   CORONARY ATHERECTOMY N/A 01/15/2017   Procedure: CORONARY ATHERECTOMY;  Surgeon: Mady Bruckner, MD;  [facility]: [facility];  Service: Cardiovascular;  Laterality: N/A;   CORONARY BALLOON ANGIOPLASTY N/A 01/15/2017   Procedure: CORONARY BALLOON ANGIOPLASTY;  Surgeon: Mady Bruckner, MD;  [facility];  Service: Cardiovascular;  Laterality: N/A;   CORONARY PRESSURE/FFR STUDY N/A 12/12/2016   Procedure: INTRAVASCULAR PRESSURE WIRE/FFR STUDY;  Surgeon: Mady Bruckner, MD;  [facility]: [facility];  Service: Cardiovascular;  Laterality: N/A;   CORONARY STENT INTERVENTION N/A 01/15/2017   Procedure: CORONARY STENT INTERVENTION;  Surgeon: Mady Bruckner, MD;  [facility];  Service: Cardiovascular;  Laterality: N/A;   ESOPHAGOGASTRODUODENOSCOPY (EGD) WITH PROPOFOL  N/A 09/07/2014   Procedure: ESOPHAGOGASTRODUODENOSCOPY (EGD) WITH PROPOFOL ;  Surgeon: Renaye Sous, MD;  Location: [facility];  Service: Endoscopy;  Laterality: N/A;   EXTERNAL EAR SURGERY Bilateral 1970s   tumors removed   EYE SURGERY Left 2019   cataract extraction    EYE SURGERY Right 02/09/2018   eyelid surgery    MINI SHUNT INSERTION Right 07/20/2013   Procedure: INSERTION OF GLAUCOMA FILTRATION DEVICE RIGHT EYE;  Surgeon: Gaither Quan, MD;  Location: [facility] OR;  Service: Ophthalmology;  Laterality: Right;   MITOMYCIN  C  APPLICATION Right 07/20/2013   Procedure: MITOMYCIN  C APPLICATION;  Surgeon: Gaither Quan, MD;  Location: [facility] OR;  Service: Ophthalmology;  Laterality: Right;   MITOMYCIN  C APPLICATION Right 02/21/2015   Procedure: MITOMYCIN  C APPLICATION RIGHT EYE;  Surgeon: Gaither Quan, MD;  Location: [facility] OR;  Service: Ophthalmology;  Laterality: Right;   PLACEMENT OF BREAST IMPLANTS Bilateral 1992   took all my breast tissue out; put implants in;fibrocystic breast disease    POLYPECTOMY  01/10/2020   Procedure: POLYPECTOMY;  Surgeon: Sous Renaye, MD;  Location: [facility];  Service: Endoscopy;;   RIGHT/LEFT HEART CATH AND CORONARY ANGIOGRAPHY N/A 12/12/2016   Procedure: RIGHT/LEFT HEART CATH AND CORONARY ANGIOGRAPHY;  Surgeon: Mady Bruckner, MD;  [facility];  Service: Cardiovascular;  Laterality: N/A;   TONSILLECTOMY     TOTAL ABDOMINAL HYSTERECTOMY  1982   TRABECULECTOMY Right 02/21/2015   Procedure: TRABECULECTOMY WITH [facility] ON THE RIGHT EYE;  Surgeon: Gaither Quan, MD;  Location[facility] OR;  Service: Ophthalmology;  Laterality: Right;   VIDEO BRONCHOSCOPY WITH ENDOBRONCHIAL NAVIGATION Right 01/11/2024   Procedure: VIDEO BRONCHOSCOPY WITH ENDOBRONCHIAL NAVIGATION;  Surgeon: Shelah Lamar RAMAN, MD;  Location: [facility]COPY;  Service: Pulmonary;  Laterality: Right;    Allergies[1]  Immunization History  Administered Date(s) Administered   Fluad Quad(high Dose 65+) 12/30/2019, 11/08/2020, 10/21/2021   INFLUENZA, HIGH DOSE SEASONAL PF 11/18/2016, 12/21/2017, 11/23/2018   Influenza Split 10/31/2013   Influenza Whole 12/23/2017   Influenza, Seasonal, Injecte, Preservative Fre 01/14/2024   Influenza,inj,Quad PF,6+ Mos 02/25/2013, 03/19/2015   Influenza,inj,quad, With Preservative 11/11/2018   Influenza-Unspecified 11/11/2022   PFIZER(Purple Top)SARS-COV-2 Vaccination 03/01/2019, 03/22/2019, 11/22/2019   PNEUMOCOCCAL CONJUGATE-20 10/07/2022   Pfizer Covid-19 Vaccine Bivalent Booster 68yrs & up  12/28/2020  Pneumococcal Polysaccharide-23 12/21/2017   Tdap 02/25/2022   Zoster Recombinant(Shingrix ) 12/25/2017, 06/13/2021    Family History  Problem Relation Age of Onset   Heart disease Father    Diabetes Father    Glaucoma Father    Cancer Mother        unknown type, ?lung   Heart disease Paternal Grandmother    Cancer Paternal Grandmother        unknown type   Diabetes Brother    Glaucoma Brother     Current Medications[2]      Objective:   Vitals:   02/23/24 1507  BP: 124/74  Pulse: 89  SpO2: 100%  Weight: 164 lb (74.4 kg)  Height: 5' 2 (1.575 m)    Estimated body mass index is 30 kg/m as calculated from the following:   Height as of this encounter: 5' 2 (1.575 m).   Weight as of this encounter: 164 lb (74.4 kg).  @WEIGHTCHANGE @  Filed Weights   02/23/24 1507  Weight: 164 lb (74.4 kg)     Physical Exam   General: No distress. Looks stable O2 at rest: no Cane present: no Sitting in wheel chair: no Frail: no Obese: no Neuro: Alert and Oriented x 3. GCS 15. Speech normal Psych: Pleasant Resp:  Barrel Chest - no.  Wheeze - no, Crackles - no, No overt respiratory distress CVS: Normal heart sounds. Murmurs - no Ext: Stigmata of Connective Tissue Disease - no HEENT: Normal upper airway. PEERL +. No post nasal drip        Assessment/     Assessment & Plan Interstitial lung disease (HCC)  Multiple lung nodules  DOE (dyspnea on exertion)  Upper back pain on left side    PLAN Patient Instructions     ICD-10-CM   1. Interstitial lung disease (HCC)  J84.9     2. Multiple lung nodules  R91.8     3. DOE (dyspnea on exertion)  R06.09     4. 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 Geronimo but after PFT    FOLLOWUP    Return for - 3 months 15-minute visit with Dr. Geronimo but after PFT.  ( Level 05 visit E&M 2024: Estb >= 40 min n  visit type: on-site physical face to visit  in total care time and counseling or/and coordination of care by this undersigned MD - Dr Dorethia Geronimo. This includes one or more of the following on this same day 02/23/2024: pre-charting, chart review, note writing, documentation discussion of test results, diagnostic or treatment recommendations, prognosis, risks and benefits of management options, instructions, education, compliance or risk-factor reduction. It excludes time  spent by the CMA or office staff in the care of the patient. Actual time 40 min)   SIGNATURE    Dr. Dorethia Cave, M.D., F.C.C.P,  Pulmonary and Critical Care Medicine Staff Physician, [facility] Center Director - Interstitial Lung Disease  Program  Pulmonary Fibrosis [facility] [place], [place], [address]  Pager: [phone], If no answer or between  15:00h - 7:00h: call [phone] Telephone: [phone]  5:13 PM 02/23/2024     [1]  Allergies Allergen Reactions   Crestor [Rosuvastatin Calcium ] Other (See Comments)    muscle aches   Demerol  [Meperidine Hcl]     Other reaction(s): Hallucinations   Shellfish Allergy Itching and Other (See Comments)    Crab, shrimp and lobster ---lips itch and tingle Was told not to eat again after having a allergy test.  Lobster, crab and shrimp   [2]  Current Outpatient Medications:    acetaminophen  (TYLENOL ) 500 MG tablet, Take 1,000 mg by mouth 2 (two) times daily as needed for moderate pain or headache., Disp: , Rfl:    albuterol  (PROVENTIL ) (2.5 MG/3ML) 0.083% nebulizer solution, Take 3 mLs (2.5 mg total) by nebulization daily as needed for wheezing or shortness of breath., Disp: 75 mL, Rfl: 5   albuterol  (VENTOLIN  HFA) 108 (90 Base) MCG/ACT inhaler, Inhale 2 puffs into the lungs every 6 (six) hours as needed for wheezing or shortness of breath., Disp: 18 g, Rfl: 12   aspirin  EC 81 MG tablet, Take 81 mg by mouth daily. Swallow whole., Disp: , Rfl:    atorvastatin  (LIPITOR) 80 MG tablet, Take 1 tablet (80 mg total) by mouth daily., Disp: 90 tablet, Rfl: 3   azaTHIOprine  (IMURAN ) 50 MG tablet, Take 1 tablet by mouth daily., Disp: , Rfl:    Blood Glucose Monitoring Suppl (ACCU-CHEK AVIVA PLUS) w/Device KIT, Use to check blood sugars 3 times a day. Dx code e11.65, Disp: 1 kit, Rfl: 3   Blood Glucose Monitoring Suppl (ACCU-CHEK GUIDE) w/Device KIT, USE AS NEEDED TO CHECK BLOOD SUGARS., Disp: 1 kit, Rfl: 0   brimonidine  (ALPHAGAN  P) 0.1 % SOLN, Place 1 drop into both eyes in the morning, at noon, and at bedtime., Disp: , Rfl:    brinzolamide  (AZOPT ) 1 % ophthalmic suspension, Place 1 drop into both eyes 3 (three) times daily., Disp: , Rfl:    carvedilol  (COREG ) 12.5 MG tablet, Take 1 tablet (12.5 mg total) by mouth 2 (two) times daily with a meal., Disp: 180 tablet, Rfl: 3   EPINEPHrine  0.3 mg/0.3 mL IJ SOAJ injection, Inject 0.3 mg into the muscle as needed for anaphylaxis., Disp: 1 each, Rfl: 0   estradiol (ESTRACE) 0.01 % CREA vaginal cream, INSERT 1 GRAM VAGINALLY FOR 45 DAYS, Disp: , Rfl:    ezetimibe  (ZETIA ) 10 MG tablet, Take 1 tablet (10 mg total) by mouth daily., Disp: 90 tablet, Rfl: 1   fluticasone  (FLONASE ) 50 MCG/ACT nasal spray, Place 2 sprays into both nostrils daily., Disp: 18.2 mL, Rfl: 2   glucose  blood (ACCU-CHEK GUIDE TEST) test strip, Use as instructed to check blood sugars E11.9, Disp: 200 each, Rfl: 1   guaiFENesin  (MUCINEX ) 600 MG 12 hr tablet, Take 1 tablet (600 mg total) by mouth daily., Disp: , Rfl:    isosorbide  mononitrate (IMDUR ) 60 MG 24 hr tablet, TAKE 1 TABLET(60 MG) BY MOUTH DAILY, Disp: 90 tablet, Rfl: 3   JANUMET  50-500 MG tablet, TAKE  1 TABLET BY MOUTH TWICE DAILY WITH A MEAL, Disp: 90 tablet, Rfl: 1   ketorolac  (ACULAR ) 0.5 % ophthalmic solution, Place 1 drop into the right eye 2 (two) times daily., Disp: , Rfl:    Lancets (ONETOUCH DELICA PLUS LANCET33G) MISC, CHECK BLOOD SUGAR BEFORE BREAKFAST AND DINNER, Disp: 100 each, Rfl: 1   montelukast  (SINGULAIR ) 10 MG tablet, Take 1 tablet (10 mg total) by mouth daily., Disp: 90 tablet, Rfl: 1   nitroGLYCERIN  (NITROSTAT ) 0.4 MG SL tablet, Place 1 tablet (0.4 mg total) under the tongue every 5 (five) minutes as needed for chest pain., Disp: 25 tablet, Rfl: prn   pantoprazole  (PROTONIX ) 40 MG tablet, Take 1 tablet (40 mg total) by mouth daily., Disp: 90 tablet, Rfl: 3   Polyvinyl Alcohol -Povidone PF (REFRESH) 1.4-0.6 % SOLN, Place 1-2 drops into both eyes 3 (three) times daily as needed (dry/irritated eyes.)., Disp: , Rfl:    prednisoLONE  acetate (PRED FORTE ) 1 % ophthalmic suspension, Place 1 drop into both eyes in the morning, at noon, and at bedtime. Place 2 Drops in Left Eye, Place 3 Drops In Right Eye TID, Disp: , Rfl:    pregabalin  (LYRICA ) 75 MG capsule, TAKE 1 CAPSULE(75 MG) BY MOUTH TWICE DAILY, Disp: 180 capsule, Rfl: 1   RHOPRESSA  0.02 % SOLN, OLACE 1 DROP INTO LEFT EYE EVERY NIGHT AT BEDTIME, Disp: , Rfl:    umeclidinium-vilanterol (ANORO ELLIPTA ) 62.5-25 MCG/ACT AEPB, Inhale 1 puff into the lungs daily., Disp: , Rfl:   "

## 2024-02-23 NOTE — Progress Notes (Unsigned)
 "      OCT 2023 [name]  She is a patient of Dr. 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  She never had a biopsy done and was treated intermittently with prednisone .  She has been told of late that the sarcoidosis is in remission.  Her recent CT scans have demonstrated pulmonary fibrosis and she has been referred here for further evaluation  Complains of intermittent chest tightness and pain.  Has dyspnea on exertion.  Denies any cough, congestion, sputum production  Pets: Dogs Occupation: Retired it consultant Exposures: No recent mold exposure from a leak in 2022.  No ongoing exposures.  No mold, hot tub, Jacuzzi.  No feather pillows or comforters ILD questionnaire 09/30/2021-negative Smoking history: Quit smoking in 1983 Travel history: No significant travel history Relevant family history: No family history of lung disease.  Interim history: Here for review of PFTs.  States that breathing is stable  Received note from Dr. 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  I agreed with the plan and communicated this with Dr. 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  She does have peribronchovascular nodularity in the upper lobe suggestive of sarcoidosis.  No connective tissue disease serologies or exposures noted.  Sarcoidosis can cause pulmonary fibrosis in a similar pattern.  Alternatively IPF can coexist with sarcoidosis.  Her interstitial lung disease has been stable on CT scans dating back several years.  We discussed further work-up and possible biopsy.  Given stability we will monitor for now PFTs reviewed.  She has been started on azathioprine  by Dr. Aryal for inflammatory arthritis which may be related to  sarcoid.  I have discussed this with him and agree with the treatment as it may be beneficial for the lungs to Follow-up with CT scan in 6 months  Plan/Recommendations: High-resolution CT in 6 months xxxxx 11/24/22-79 yo female former smoker (4 pkyrs) followed for COPD/bronchiolitis/lung nodules, Ocular Sarcoid, ILD/UIP, OSA, complicated by GERD, Glaucoma, DM 2, CAD/ stent, HTN, DDD lumbar spine, Covid infection April 2022, ASCVD/ Aortic, CAD, Lymphedema, Covid infection,  -Singulair , Neb albutr ILD- possible UIP and / or Sarcoid, stable over recent years.  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 Pulmonary Rehab- done               ED for Covid 01/30/22> benzonatate  CPAP  Luna- 5-15 / [employer]    Saw Dr Theophilus for replacement [id number] Download compliance- CPAP used 2-3 days ago. Body weight today-166 lbs Not driving for 6 months after possible seizure, Sputum cultures from June all nl flora/ yeast, neg AFB. Took doxy after these cultures obtained. Had Flu vax Still some night sweats, productive cough, DOE but not worse. She is aware memory not good- forgets CPAP- discussed. Had flu vax. CT chest 07/25/22- IMPRESSION: 1. New irregular solid nodule of the right lung apex, dominant nodule described on prior exam has resolved, and new clustered small solid nodules are seen in the posterior right upper lobe. Findings are likely due to chronic atypical infection. Recommend continued follow-up based on patient risk factors. 2. 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 [place] JINNY [facility] Crit Care Med Vol 198,  Iss 5, [id number], Oct 11 2016. 3. Aortic Atherosclerosis (ICD10-I70.0) and Emphysema (ICD10-J43.9).   OV 111@4  - ABMLF    Patient ID: AVAMARIE CROSSLEY, female    DOB:  1945-08-05, 79 y.o.   MRN: [id number]  HPI The patient is a 79 year old former smoker with a history of COPD, sarcoidosis, diabetes, hypertension, hyperlipidemia, and obstructive sleep apnea (OSA) on CPAP. She was recently hospitalized following an episode of syncope and head trauma, during which no seizure activity was observed. A urinary tract infection was identified and treated with Augmentin  during the hospitalization. A CT pulmonary angiogram (CTPA) performed during the hospital stay revealed a speculated incavitary nodule in the right upper lobe, measuring 1.4 by 2.4 cm, which was slightly larger than on a previous CT chest. The patient has been under the care of Dr. Neysa for her sarcoidosis.  The patient's sarcoidosis was diagnosed based on ocular manifestations and a family history of the condition, although no lung tests were performed. She has been on various medications for the condition, including methotrexate  and steroids, and is currently on azathioprine , which was started less than a year ago. The patient also has a history of arthritis.  The patient experienced a blackout and head trauma, which did not break the skin. A neurologist diagnosed her with a concussion. She has been experiencing numbness on one side of her face and inside her mouth for the past two to three months, which has been evaluated by a dentist with no clear cause identified. The patient's daughter also attended the consultation.   OV 02/23/2023 visit with Dr. Geronimo crawford mistake in scheduling but ultimately decided to stick with Dr. Geronimo for fibrosis]  Subjective:  Patient ID: Alejandra VEAR Goldberg, female , DOB: [birth year] , age 79 y.o. , MRN: [id number] , ADDRESS: [address] [place] [address] PCP [name], MD Patient Care Team: [name], MD as PCP - General (Internal Medicine) Raford Riggs, MD as PCP - Cardiology (Cardiology) [name], [name], MD as Consulting Physician  (Cardiology) Arnell Rockney PARAS, [facility] (Inactive) (Pharmacist)  This Provider for this visit: Treatment Team:  Attending Provider: Geronimo Amel, MD    02/23/2023 -   Chief Complaint  Patient presents with   Follow-up    Pt states she has been more sob, currently having sinus issues. Using albuterol  and anoro      HPI Cannon H Widdowson 79 y.o. -presents for follow-up.  When I walked in she and husband but under the impression they are going to see Dr. Lamar Chris for the lung nodule.  There is no clear-cut referral from Dr. Chris or Dr. Reggy Neysa to see me.  So history is gained by talking to the patient, husband on also later chart review after she left the office.  It appears that she has sleep apnea for which she is follows with Dr. Neysa and she uses nighttime CPAP without oxygen .  Late in 2024 she got diagnosed with a cavitary spiculated nodule.  She was referred to [facility] by them did a follow-up CT scan of the chest that was read out on Christmas Day 2024.  The nodule spiculated nodule resolved.  She has some chronic changes of sarcoidosis.  She is supposed to see Dr. Byrum for this visit but for unclear reasons she is with me.  Then I reviewed the chart and realized that she has seen Dr. Theophilus twice in 2023 for ILD.  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  Back in 2023 she was started on azathioprine  for her inflammatory arthritis related to sarcoid.  As of 2023 her CT scan and pulmonary function test was deemed stable.  Looks like she never followed up back with Dr. Theophilus.  At this point in time she is on Anoro and Singulair  but I do not see azathioprine  or any immunomodulators on her medication list.  She had a ED visit 02/09/2023 for acute cystitis with hematuria.  She did see [facility] cardiology PA today.  She reported to her chronic severe fatigue.  She describes the same to me has been going on for many years.  Review of the records indicate  that she has had his ILD.  Most recent CT chest and compared to 2018 all looks stable to me.  At least in the radiology reports she has been stable between 2022 and 2024 with a probable UIP.  She said she had lab work with cardiology today but at the time of this dictation I do not see this.  However end of October 2024 she had a slightly high creatinine 1.01 mg percent slightly high AST.  She has negative serology in 2023 except for trace positive ANA 1: 40.  Echocardiogram October 2024 ejection fraction 65%.  Sit/stand test today pulse ox drop from 100% to 93% after sitting and standing 15 times.  Heart rate ranged from 86 to 102/min.  Myocardial perfusion test normal in 2021  Last set of pulmonary function test November 2024 although the [facility] is declining the TLC has been stable for years.  To me the radiology images ILD is also stable.  Follow-up: I discussed with Dr. Reggy and patient has an appointment with him for sleep apnea.  He said this could be deferred for 6 months.  Did indicate to her that the nodule she saw Dr. Shelah for is resolved..  Therefore I will see her for ILD and she is agreed to that.      CLINICAL DATA:  Follow-up lung nodule   EXAM: CT CHEST WITHOUT CONTRAST   TECHNIQUE: Multidetector CT imaging of the chest was performed following the standard protocol without IV contrast.   RADIATION DOSE REDUCTION: This exam was performed according to the departmental dose-optimization program which includes automated exposure control, adjustment of the mA and/or kV according to patient size and/or use of iterative reconstruction technique.   COMPARISON:  CTA chest dated 12/03/2022   FINDINGS: Cardiovascular: Normal heart size. No significant pericardial fluid/thickening. Great vessels are normal in course and caliber. Coronary artery calcifications. LAD coronary stent. Coronary artery calcifications and aortic atherosclerosis.   Mediastinum/Nodes: Imaged  thyroid  gland without nodules meeting criteria for imaging follow-up by size. Normal esophagus. No pathologically enlarged axillary, supraclavicular, mediastinal, or hilar lymph nodes. Unchanged prominent subcentimeter left internal mammary lymph nodes measuring up to 3 mm (2:37).   Lungs/Pleura: The central airways are patent. Scattered changes of centrilobular emphysema. Similar upper lobe bronchiectasis. Similar lower lung predominant fibrotic changes. Again seen are waxing and waning tree-in-bud and clustered nodular changes in the right apex, now measuring up to 6 x 5 mm (8:26). Previously noted spiculated cavitary nodule is no longer seen. No pneumothorax. No pleural effusion.   Upper abdomen: Normal.   Musculoskeletal: No acute or abnormal lytic or blastic osseous lesions. Bilateral breast implants.   IMPRESSION: 1. Waxing and waning tree-in-bud and clustered nodular changes in the right apex, now measuring up to 6 x 5 mm. Previously noted spiculated cavitary nodule is no longer  seen. Findings are favored to represent a chronic atypical infectious process. Recommend continued follow-up based on patient risk factors. 2. Similar lower lung predominant fibrotic changes. 3. Aortic Atherosclerosis (ICD10-I70.0) and Emphysema (ICD10-J43.9). Coronary artery calcifications. Assessment for potential risk factor modification, dietary therapy or pharmacologic therapy may be warranted, if clinically indicated.     Electronically Signed   By: Limin  Xu M.D.   On: 02/04/2023 21:37   01/18/2024 Discussed the use of AI scribe software for clinical note transcription with the patient, who gave verbal consent to proceed.   History of Present Illness Pt. Presents for follow up after bronchoscopy with biopsies on 01/11/2024. Biopsy was done to better evaluate waxing and waning pulmonary lung nodules . Suspicion for malignancy was lower given the waxing and waning nature of the nodules. Plan  was for biopsy of a right upper lobe to rule out malignancy and possibly rule in inflammatory process like sarcoid. She does use an albuterol  inhaler a few times a week. She also uses an Anoro Inhaler Daily.   She states she did well after her bronchoscopy with biopsies. No bleeding, fever, discolored secretions or worsening dyspnea than baseline . No adverse reaction to anesthesia, although she did state she was sleepy for a few days.    We have reviewed the results of her biopsy. The biopsy results showed benign bronchial cells with mixed inflammation but no malignant cells or granulomatous changes. We discussed that this biopsy did not confirm sarcoidosis as there were no granulomas present.    She has a history of sarcoidosis and ILD and was treated with prednisone  during a previous respiratory flare-up. She is confused about her diagnosis, as previous doctors attributed her symptoms to sarcoidosis, but the recent biopsy did not confirm this. I explained that it may be we just didn't get any granulomatous cells with the biopsy. We also discussed that this may be related to other autoimmune issues. She also has a history of Rheumatoid Arthritis, which can also cause pulmonary nodules.    She has a history of COPD and has undergone pulmonary function tests. She experiences shortness of breath and cough, which have improved with the use of inhalers such as albuterol  and Anoro. Despite these treatments, she continues to experience some cough, however it is better than it has been in the past. She endorses shortness of breath. She quit smoking over 40 years ago. The non malignant diagnosis is reassuring.    She reports experiencing excruciating pain that radiates from her right front to her back, which is exacerbated by bending over and is associated with shortness of breath. This pain has become more frequent. She has discussed this with her primary care doctor, but no definitive cause has been  identified.   She has a history of rheumatoid arthritis and has been treated with azathioprine , which she finds somewhat helpful for pain. She previously tried methotrexate  but discontinued it due to side effects. She also experiences reflux, which she is treating. She continues to follow up with rheumatology.   Plan will be for follow up with Dr. Geronimo to further evaluate cause, and for treatment  of her ILD.       Test Results: 01/11/2024 Cytology A. LUNG, RUL NODULE, NEEDLE ASPIRATION  BIOPSY:  - No malignant cells identified  - Benign bronchial cells and extensive mixed inflammation   B. LUNG, RUL NODULE, BRUSHING:  - No malignant cells identified  - Benign bronchial cells and extensive mixed inflammation    01/11/2024  BAL  No Growth   01/11/2024 Fungal Pending   PFT  OV 07/31/2023  Subjective:  Patient ID: Alejandra VEAR Goldberg, female , DOB: Mar 03, 1945 , age 76 y.o. , MRN: [id number] , ADDRESS: [address] [place] [address] PCP [name], MD Patient Care Team: [name], MD as PCP - General (Internal Medicine) Raford Riggs, MD as PCP - Cardiology (Cardiology) [name], [name], MD as Consulting Physician (Cardiology) Arnell Rockney PARAS, RPH (Inactive) (Pharmacist) Morgan Clayborne CROME, RN as [facility] Care Management  This Provider for this visit: Treatment Team:  Attending Provider: Geronimo Amel, MD    07/31/2023 -   Chief Complaint  Patient presents with   Follow-up    She states has had sinus infection for approx 3 wks. She has a prod cough with yellow sputum, SOB and wheezing. Denies any fevers/aches.      HPI Katalyna Socarras Huckeby 79 y.o. -presents for follow-up.  Presents with her sister in law.  Who lives [place].  Sister's name is Gwenlyn.  She reports that appointment 3-4 weeks ago she was feeling fine.  She then picked up sinus infection/congestion.  [facility] infection gave her Augmentin   for 7 days but she was not better.  She then followed up with primary care but was still feeling congested.  She went back to primary care they gave her doxycycline  for 7 days that she finished yesterday.  She is been referred to PCP but not been able to see PCP as yet.  She is not feeling well continues to wheeze sputum is still brown and yellow [baseline is clear] more winded also wheezing.  Sinuses definitely feeling congested.  She was told she needs some blood work.  I will get IgE and CBC with differential and chemistry panel.  In terms of her sarcoid/ILD: She had pulmonary function test and it shows 5 to 7 years stability  In terms of her waxing and waning multiple pulmonary nodules: I wanted to present this to the case conference but because of radiology shortage this has not been done so.  But I reassured of the waxing and waning nodules lower the probability for cancer and that we would get another CT scan in 3 months which would be a 109-month follow-up.  She is agreeable with this plan.       OV 02/23/2024  Subjective:  Patient ID: Alejandra VEAR Goldberg, female , DOB: 1946-01-12 , age 27 y.o. , MRN: [id number] , ADDRESS: [address] [place] [address] PCP [name], MD Patient Care Team: [name], MD as PCP - General (Internal Medicine) Raford Riggs, MD as PCP - Cardiology (Cardiology) [name], [name], MD (Inactive) as Consulting Physician (Cardiology) Arnell Rockney PARAS, [employer] (Inactive) (Pharmacist)  This Provider for this visit: Treatment Team:  Attending Provider: Geronimo Amel, MD    02/23/2024 -  No chief complaint on file.    HPI Nerine H Urbieta 79 y.o. -      SYMPTOM SCALE - ILD 02/23/2023  Current weight   O2 use ra  Shortness of Breath 0 -> 5 scale with 5 being worst (score 6 If unable to do)  At rest 4  Simple tasks - showers, clothes change, eating, shaving 5  Household (dishes, doing bed, laundry) 4  Shopping 4  Walking level at  own pace 4  Walking up Stairs 5  Total (30-36) Dyspnea Score 26  How bad is your cough? 3  How bad is your fatigue 4  How bad is nausea 0  How bad is vomiting?  0  How bad is diarrhea? 3  How bad is anxiety? 3  How bad is depression 2  Any chronic pain - if so where and how bad 0    CT Chest data from date: ****  - personally visualized and independently interpreted : *** - my findings are: ***   PFT     Latest Ref Rng & Units 06/29/2023    8:59 AM 01/05/2023   10:31 AM 11/27/2021    1:16 PM 04/13/2020   10:06 AM 11/25/2016    4:11 PM  PFT Results  FVC-Pre L 1.70  1.49  1.67  1.66  1.70   FVC-Predicted Pre % 65  57  63  79  78   FVC-Post L  1.53  1.60  1.59  1.69   FVC-Predicted Post %  59  61  76  78   Pre FEV1/FVC % % 73  81  77  77  81   Post FEV1/FCV % %  79  80  81  83   FEV1-Pre L 1.24  1.20  1.29  1.27  1.39   FEV1-Predicted Pre % 64  62  66  79  82   FEV1-Post L  1.21  1.28  1.28  1.40   DLCO uncorrected ml/min/mmHg 9.11  10.23  9.64  9.27  9.95   DLCO UNC% % 51  57  53  51  46   DLCO corrected ml/min/mmHg  10.72  10.38  9.27  10.18   DLCO COR %Predicted %  60  57  51  47   DLVA Predicted % 79  100  87  80  88   TLC L  2.89  3.24  3.52  3.29   TLC % Predicted %  60  68  73  69   RV % Predicted %  57  71  85  75        LAB RESULTS last 96 hours No results found.       has a past medical history of Anemia, Anxiety, Arthritis, Asthma, Bronchitis with emphysema, Chest pain, Chronic bronchitis (HCC), Coronary artery disease, Family history of anesthesia complication, Fibromyalgia, GERD (gastroesophageal reflux disease), Glaucoma, Heart murmur, History of hiatal hernia, echocardiogram, Hyperlipidemia, Hypertension, Nausea vomiting and diarrhea (08/24/2023), Nonsustained ventricular tachycardia (HCC), OSA on CPAP, Premature atrial contractions, PVC (premature ventricular contraction), Sarcoidosis, TIA (transient ischemic attack), and Type II diabetes mellitus  (HCC).   reports that she quit smoking about 44 years ago. Her smoking use included cigarettes. She started smoking about 48 years ago. She has a 4 pack-year smoking history. She has never used smokeless tobacco.  Past Surgical History:  Procedure Laterality Date   BREAST SURGERY     BRONCHIAL BRUSHINGS  01/11/2024   Procedure: BRONCHOSCOPY, WITH BRUSH BIOPSY;  Surgeon: Shelah Lamar RAMAN, MD;  Location: [facility];  Service: Pulmonary;;   BRONCHIAL NEEDLE ASPIRATION BIOPSY  01/11/2024   Procedure: BRONCHOSCOPY, WITH NEEDLE ASPIRATION BIOPSY;  Surgeon: Shelah Lamar RAMAN, MD;  Location: [facility];  Service: Pulmonary;;   CARDIAC CATHETERIZATION  05/04/2007   reveals overall normal left ventricular systolic function. Ejection fraction 65-70%   CATARACT EXTRACTION W/PHACO Right 07/20/2013   Procedure: CATARACT EXTRACTION PHACO AND INTRAOCULAR LENS PLACEMENT (IOC);  Surgeon: Gaither Quan, MD;  Location: [facility] OR;  Service: Ophthalmology;  Laterality: Right;   COLONOSCOPY W/ BIOPSIES AND POLYPECTOMY  COLONOSCOPY WITH PROPOFOL  N/A 09/07/2014   Procedure: COLONOSCOPY WITH PROPOFOL ;  Surgeon: Renaye Sous, MD;  Location: [facility];  Service: Endoscopy;  Laterality: N/A;   COLONOSCOPY WITH PROPOFOL  N/A 01/10/2020   Procedure: COLONOSCOPY WITH PROPOFOL ;  Surgeon: Sous Renaye, MD;  Location: [facility];  Service: Endoscopy;  Laterality: N/A;   CORONARY ANGIOPLASTY WITH STENT PLACEMENT  01/15/2017   CORONARY ATHERECTOMY N/A 01/15/2017   Procedure: CORONARY ATHERECTOMY;  Surgeon: Mady Bruckner, MD;  [facility]: [facility];  Service: Cardiovascular;  Laterality: N/A;   CORONARY BALLOON ANGIOPLASTY N/A 01/15/2017   Procedure: CORONARY BALLOON ANGIOPLASTY;  Surgeon: Mady Bruckner, MD;  [facility]: [facility];  Service: Cardiovascular;  Laterality: N/A;   CORONARY PRESSURE/FFR STUDY N/A 12/12/2016   Procedure: INTRAVASCULAR PRESSURE WIRE/FFR STUDY;  Surgeon: Mady Bruckner, MD;  Location:  [facility];  Service: Cardiovascular;  Laterality: N/A;   CORONARY STENT INTERVENTION N/A 01/15/2017   Procedure: CORONARY STENT INTERVENTION;  Surgeon: Mady Bruckner, MD;  [facility]: [facility];  Service: Cardiovascular;  Laterality: N/A;   ESOPHAGOGASTRODUODENOSCOPY (EGD) WITH PROPOFOL  N/A 09/07/2014   Procedure: ESOPHAGOGASTRODUODENOSCOPY (EGD) WITH PROPOFOL ;  Surgeon: Renaye Sous, MD;  Location: [facility];  Service: Endoscopy;  Laterality: N/A;   EXTERNAL EAR SURGERY Bilateral 1970s   tumors removed   EYE SURGERY Left 2019   cataract extraction    EYE SURGERY Right 02/09/2018   eyelid surgery    MINI SHUNT INSERTION Right 07/20/2013   Procedure: INSERTION OF GLAUCOMA FILTRATION DEVICE RIGHT EYE;  Surgeon: Gaither Quan, MD;  Location[facility];  Service: Ophthalmology;  Laterality: Right;   MITOMYCIN  C APPLICATION Right 07/20/2013   Procedure: MITOMYCIN  C APPLICATION;  Surgeon: Gaither Quan, MD;  Location: [facility] OR;  Service: Ophthalmology;  Laterality: Right;   MITOMYCIN  C APPLICATION Right 02/21/2015   Procedure: MITOMYCIN  C APPLICATION RIGHT EYE;  Surgeon: Gaither Quan, MD;  Location: [facility] OR;  Service: Ophthalmology;  Laterality: Right;   PLACEMENT OF BREAST IMPLANTS Bilateral 1992   took all my breast tissue out; put implants in;fibrocystic breast disease    POLYPECTOMY  01/10/2020   Procedure: POLYPECTOMY;  Surgeon: Sous Renaye, MD;  Location: [facility];  Service: Endoscopy;;   RIGHT/LEFT HEART CATH AND CORONARY ANGIOGRAPHY N/A 12/12/2016   Procedure: RIGHT/LEFT HEART CATH AND CORONARY ANGIOGRAPHY;  Surgeon: Mady Bruckner, MD;  [facility];  Service: Cardiovascular;  Laterality: N/A;   TONSILLECTOMY     TOTAL ABDOMINAL HYSTERECTOMY  1982   TRABECULECTOMY Right 02/21/2015   Procedure: TRABECULECTOMY WITH [facility] ON THE RIGHT EYE;  Surgeon: Gaither Quan, MD;  Location: [facility] OR;  Service: Ophthalmology;  Laterality: Right;   VIDEO BRONCHOSCOPY WITH  ENDOBRONCHIAL NAVIGATION Right 01/11/2024   Procedure: VIDEO BRONCHOSCOPY WITH ENDOBRONCHIAL NAVIGATION;  Surgeon: Shelah Lamar RAMAN, MD;  Location: [facility] ENDOSCOPY;  Service: Pulmonary;  Laterality: Right;    Allergies[1]  Immunization History  Administered Date(s) Administered   Fluad Quad(high Dose 65+) 12/30/2019, 11/08/2020, 10/21/2021   INFLUENZA, HIGH DOSE SEASONAL PF 11/18/2016, 12/21/2017, 11/23/2018   Influenza Split 10/31/2013   Influenza Whole 12/23/2017   Influenza, Seasonal, Injecte, Preservative Fre 01/14/2024   Influenza,inj,Quad PF,6+ Mos 02/25/2013, 03/19/2015   Influenza,inj,quad, With Preservative 11/11/2018   Influenza-Unspecified 11/11/2022   PFIZER(Purple Top)SARS-COV-2 Vaccination 03/01/2019, 03/22/2019, 11/22/2019   PNEUMOCOCCAL CONJUGATE-20 10/07/2022   Pfizer Covid-19 Vaccine Bivalent Booster 30yrs & up 12/28/2020   Pneumococcal Polysaccharide-23 12/21/2017   Tdap 02/25/2022   Zoster Recombinant(Shingrix ) 12/25/2017, 06/13/2021    Family History  Problem Relation Age of Onset   Heart disease Father    Diabetes Father    Glaucoma Father    Cancer Mother        unknown type, ?lung   Heart disease Paternal Grandmother    Cancer Paternal Grandmother        unknown type   Diabetes Brother    Glaucoma Brother     Current Medications[2]      Objective:   There were no vitals filed for this visit.  Estimated body mass index is 29.81 kg/m as calculated from the following:   Height as of 02/17/24: 5' 2 (1.575 m).   Weight as of 02/17/24: 163 lb (73.9 kg).  @WEIGHTCHANGE @  There were no vitals filed for this visit.   Physical Exam   General: No distress. *** O2 at rest: *** Cane present: *** Sitting in wheel chair: *** Frail: *** Obese: *** Neuro: Alert and Oriented x 3. GCS 15. Speech normal Psych: Pleasant Resp:  Barrel Chest - ***.  Wheeze - ***, Crackles - ***, No overt respiratory distress CVS: Normal heart sounds. Murmurs - *** Ext:  Stigmata of Connective Tissue Disease - *** HEENT: Normal upper airway. PEERL +. No post nasal drip        Assessment/     Assessment & Plan Status post bronchoscopy with biopsy  Sarcoidosis    PLAN Patient Instructions     ICD-10-CM   1. ILD (interstitial lung disease) (HCC)  J84.9     2. Sarcoidosis  D86.9     3. Multiple lung nodules  R91.8     4. Acute sinusitis, recurrence not specified, unspecified location  J01.90     5. Obstructive sleep apnea  G47.33       ILD (interstitial lung disease) (HCC) Sarcoidosis   - PFT stable 2018-> May 2025   Plan  =  continue supportive care - do HRCT in 3 monts  Multiple lung nodules   - waxing and waning quality over time lowers cancer risk - lAst CT Dec 2024  Plan  - repet HRCT in 3 months  Acute sinusitis, recurrence  not specified, unspecified location   - unclear why not resolved despited antbiiotics  Pl;an  - check cbc with diff, bmet, IgE - Please take prednisone  40 mg x1 day, then 30 mg x1 day, then 20 mg x1 day, then 10 mg x1 day, and then 5 mg x1 day and stop   Obstructive sleep apnea  Plan  - per sleep doc  Follow-up - 3 months 15-minute visit with Dr. Geronimo but after CT    FOLLOWUP    No follow-ups on file.    SIGNATURE    Dr. Dorethia Geronimo, M.D., F.C.C.P,  Pulmonary and Critical Care Medicine Staff Physician, [facility] Center Director - Interstitial Lung Disease  Program  Pulmonary Fibrosis [facility] [place], [place], [address]  Pager: [phone], If no answer or between  15:00h - 7:00h: call [phone] Telephone: [phone]  6:03 AM 02/23/2024   Moderate Complexity MDM OFFICE  2021 E/M guidelines, first released in 2021, with minor revisions added in 2023 and 2024 Must meet the requirements for 2 out of 3 dimensions to qualify.    Number and complexity of problems addressed Amount and/or complexity of  data reviewed Risk of complications and/or morbidity  One or more chronic illness with mild exacerbation, OR progression, OR  side effects  of treatment  Two or more stable chronic illnesses  One undiagnosed new problem with uncertain prognosis  One acute illness with systemic symptoms   One Acute complicated injury Must meet the requirements for 1 of 3 of the categories)  Category 1: Tests and documents, historian  Any combination of 3 of the following:  Assessment requiring an independent historian  Review of prior external note(s) from each unique source  Review of results of each unique test  Ordering of each unique test    Category 2: Interpretation of tests   Independent interpretation of a test performed by another physician/other qualified health care professional (not separately reported)  Category 3: Discuss management/tests  Discussion of management or test interpretation with external physician/other qualified health care professional/appropriate source (not separately reported) Moderate risk of morbidity from additional diagnostic testing or treatment Examples only:  Prescription drug management  Decision regarding minor surgery with identfied patient or procedure risk factors  Decision regarding elective major surgery without identified patient or procedure risk factors  Diagnosis or treatment significantly limited by social determinants of health             HIGh Complexity  OFFICE   2021 E/M guidelines, first released in 2021, with minor revisions added in 2023. Must meet the requirements for 2 out of 3 dimensions to qualify.    Number and complexity of problems addressed Amount and/or complexity of data reviewed Risk of complications and/or morbidity  Severe exacerbation of chronic illness  Acute or chronic illnesses that may pose a threat to life or bodily function, e.g., multiple trauma, acute MI, pulmonary embolus, severe respiratory  distress, progressive rheumatoid arthritis, psychiatric illness with potential threat to self or others, peritonitis, acute renal failure, abrupt change in neurological status Must meet the requirements for 2 of 3 of the categories)  Category 1: Tests and documents, historian  Any combination of 3 of the following:  Assessment requiring an independent historian  Review of prior external note(s) from each unique source  Review of results of each unique test  Ordering of each unique test    Category 2: Interpretation of tests    Independent interpretation of a test performed by another physician/other qualified health care professional (not separately reported)  Category 3: Discuss management/tests  Discussion of management or test interpretation with external physician/other qualified health care professional/appropriate source (not separately reported)  HIGH risk of morbidity from additional diagnostic testing or treatment Examples only:  Drug therapy requiring intensive monitoring for toxicity  Decision for elective major surgery with identified pateint or procedure risk factors  Decision regarding hospitalization or escalation of level of care  Decision for DNR or to de-escalate care   Parenteral controlled  substances            LEGEND - Independent interpretation involves the interpretation of a test for which there is a CPT code, and an interpretation or report is customary. When a review and interpretation of a test is performed and documented by the provider, but not separately reported (billed), then this would represent an independent interpretation. This report does not need to conform to the usual standards of a complete report of the test. This does not include interpretation of tests that do not have formal reports such as a complete blood count with differential and blood cultures. Examples would include reviewing a chest radiograph and documenting in  the medical record an interpretation, but not separately reporting (billing) the interpretation of the chest radiograph.  An appropriate source includes professionals who are not health care professionals but may be involved in the management of the patient, such as a clinical research associate, upper officer, case manager or teacher, and does not include discussion with family or informal caregivers.    - SDOH: SDOH are the conditions in the environments where people are born, live, learn, work, play, worship, and age that affect a wide range of health, functioning, and quality-of-life outcomes and risks. (e.g., housing, food insecurity, transportation, etc.). SDOH-related Z codes ranging from Z55-Z65 are the ICD-10-CM diagnosis codes used to document SDOH data Z55 - Problems related to education and literacy Z56 - Problems related to employment and unemployment Z57 - Occupational exposure to risk factors Z58 - Problems related to physical environment Z59 - Problems related to housing and economic circumstances [phone] - Problems related to social environment [phone] - Problems related to upbringing [phone] - Other problems related to primary support group, including family circumstances Z52 - Problems related to certain psychosocial circumstances Z65 - Problems related to other psychosocial circumstances    [1]  Allergies Allergen Reactions   Crestor [Rosuvastatin Calcium ] Other (See Comments)    muscle aches   Demerol  [Meperidine Hcl]     Other reaction(s): Hallucinations   Shellfish Allergy Itching and Other (See Comments)    Crab, shrimp and lobster ---lips itch and tingle Was told not to eat again after having a allergy test. Lobster, crab and shrimp   [2]  Current Outpatient Medications:    acetaminophen  (TYLENOL ) 500 MG tablet, Take 1,000 mg by mouth 2 (two) times daily as needed for moderate pain or headache., Disp: , Rfl:    albuterol  (PROVENTIL ) (2.5 MG/3ML) 0.083% nebulizer solution, Take 3  mLs (2.5 mg total) by nebulization daily as needed for wheezing or shortness of breath., Disp: 75 mL, Rfl: 5   albuterol  (VENTOLIN  HFA) 108 (90 Base) MCG/ACT inhaler, Inhale 2 puffs into the lungs every 6 (six) hours as needed for wheezing or shortness of breath., Disp: 18 g, Rfl: 12   aspirin  EC 81 MG tablet, Take 81 mg by mouth daily. Swallow whole., Disp: , Rfl:    atorvastatin  (LIPITOR) 80 MG tablet, Take 1 tablet (80 mg total) by mouth daily., Disp: 90 tablet, Rfl: 3   azaTHIOprine  (IMURAN ) 50 MG tablet, Take 1 tablet by mouth daily., Disp: , Rfl:    Blood Glucose Monitoring Suppl (ACCU-CHEK AVIVA PLUS) w/Device KIT, Use to check blood sugars 3 times a day. Dx code e11.65, Disp: 1 kit, Rfl: 3   Blood Glucose Monitoring Suppl (ACCU-CHEK GUIDE) w/Device KIT, USE AS NEEDED TO CHECK BLOOD SUGARS., Disp: 1 kit, Rfl: 0   brimonidine  (ALPHAGAN  P) 0.1 % SOLN, Place 1 drop into both eyes in the morning, at noon, and at bedtime., Disp: , Rfl:    brinzolamide  (AZOPT ) 1 % ophthalmic suspension, Place 1 drop into both eyes 3 (three) times daily., Disp: , Rfl:    carvedilol  (COREG ) 12.5 MG tablet, Take 1 tablet (12.5 mg total) by mouth 2 (two) times daily with a meal., Disp: 180 tablet, Rfl: 3   EPINEPHrine  0.3 mg/0.3 mL IJ SOAJ injection, Inject 0.3 mg into the muscle as needed for anaphylaxis., Disp: 1 each, Rfl: 0   estradiol (ESTRACE) 0.01 % CREA vaginal cream, INSERT 1 GRAM VAGINALLY FOR 45 DAYS, Disp: , Rfl:    ezetimibe  (ZETIA ) 10 MG tablet, Take 1 tablet (10 mg total) by mouth daily., Disp: 90 tablet, Rfl: 1   fluticasone  (FLONASE ) 50  MCG/ACT nasal spray, Place 2 sprays into both nostrils daily., Disp: 18.2 mL, Rfl: 2   glucose blood (ACCU-CHEK GUIDE TEST) test strip, Use as instructed to check blood sugars E11.9, Disp: 200 each, Rfl: 1   guaiFENesin  (MUCINEX ) 600 MG 12 hr tablet, Take 1 tablet (600 mg total) by mouth daily., Disp: , Rfl:    isosorbide  mononitrate (IMDUR ) 60 MG 24 hr tablet, TAKE 1  TABLET(60 MG) BY MOUTH DAILY, Disp: 90 tablet, Rfl: 3   JANUMET  50-500 MG tablet, TAKE 1 TABLET BY MOUTH TWICE DAILY WITH A MEAL, Disp: 90 tablet, Rfl: 1   ketorolac  (ACULAR ) 0.5 % ophthalmic solution, Place 1 drop into the right eye 2 (two) times daily., Disp: , Rfl:    Lancets (ONETOUCH DELICA PLUS LANCET33G) MISC, CHECK BLOOD SUGAR BEFORE BREAKFAST AND DINNER, Disp: 100 each, Rfl: 1   montelukast  (SINGULAIR ) 10 MG tablet, Take 1 tablet (10 mg total) by mouth daily., Disp: 90 tablet, Rfl: 1   nitroGLYCERIN  (NITROSTAT ) 0.4 MG SL tablet, Place 1 tablet (0.4 mg total) under the tongue every 5 (five) minutes as needed for chest pain., Disp: 25 tablet, Rfl: prn   pantoprazole  (PROTONIX ) 40 MG tablet, Take 1 tablet (40 mg total) by mouth daily., Disp: 90 tablet, Rfl: 3   Polyvinyl Alcohol -Povidone PF (REFRESH) 1.4-0.6 % SOLN, Place 1-2 drops into both eyes 3 (three) times daily as needed (dry/irritated eyes.)., Disp: , Rfl:    prednisoLONE  acetate (PRED FORTE ) 1 % ophthalmic suspension, Place 1 drop into both eyes in the morning, at noon, and at bedtime. Place 2 Drops in Left Eye, Place 3 Drops In Right Eye TID, Disp: , Rfl:    pregabalin  (LYRICA ) 75 MG capsule, TAKE 1 CAPSULE(75 MG) BY MOUTH TWICE DAILY, Disp: 180 capsule, Rfl: 1   RHOPRESSA  0.02 % SOLN, OLACE 1 DROP INTO LEFT EYE EVERY NIGHT AT BEDTIME, Disp: , Rfl:    umeclidinium-vilanterol (ANORO ELLIPTA ) 62.5-25 MCG/ACT AEPB, Inhale 1 puff into the lungs daily., Disp: , Rfl:   "

## 2024-02-23 NOTE — Patient Instructions (Signed)
 ICD-10-CM   1. ILD (interstitial lung disease) (HCC)  J84.9     2. Sarcoidosis  D86.9     3. Multiple lung nodules  R91.8     4. Acute sinusitis, recurrence not specified, unspecified location  J01.90     5. Obstructive sleep apnea  G47.33       ILD (interstitial lung disease) (HCC) Sarcoidosis   - PFT stable 2018-> May 2025   Plan  =  continue supportive care - do HRCT in 3 monts  Multiple lung nodules   - waxing and waning quality over time lowers cancer risk - lAst CT Dec 2024  Plan  - repet HRCT in 3 months  Acute sinusitis, recurrence  not specified, unspecified location   - unclear why not resolved despited antbiiotics  Pl;an  - check cbc with diff, bmet, IgE - Please take prednisone  40 mg x1 day, then 30 mg x1 day, then 20 mg x1 day, then 10 mg x1 day, and then 5 mg x1 day and stop   Obstructive sleep apnea  Plan  - per sleep doc  Follow-up - 3 months 15-minute visit with Dr. Bertrum Brodie but after CT

## 2024-02-23 NOTE — Patient Instructions (Addendum)
"    ICD-10-CM   1. Interstitial lung disease (HCC)  J84.9     2. Multiple lung nodules  R91.8     3. DOE (dyspnea on exertion)  R06.09     4. 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 Geronimo but after PFT "

## 2024-02-25 ENCOUNTER — Encounter (HOSPITAL_COMMUNITY): Payer: Self-pay

## 2024-02-25 ENCOUNTER — Telehealth (HOSPITAL_COMMUNITY): Payer: Self-pay

## 2024-02-25 LAB — QUANTIFERON-TB GOLD PLUS
Mitogen-NIL: 6.22 [IU]/mL
NIL: 0.01 [IU]/mL
QuantiFERON-TB Gold Plus: NEGATIVE
TB1-NIL: 0 [IU]/mL
TB2-NIL: 0 [IU]/mL

## 2024-02-25 NOTE — Telephone Encounter (Signed)
Attempted to call patient in regards to Pulmonary Rehab - LM on VM   Sent letter 

## 2024-02-26 ENCOUNTER — Telehealth (HOSPITAL_COMMUNITY): Payer: Self-pay

## 2024-02-26 LAB — MAC SUSCEPTIBILITY BROTH
Ciprofloxacin: 2
Clarithromycin: 1
Doxycycline: 8
Linezolid: 32
Minocycline: 8
Moxifloxacin: 0.5
Rifabutin: 0.5
Rifampin: >4
Streptomycin: 32

## 2024-02-26 LAB — ACID FAST ID BY PCR AND SUSCEPTIBILITIES
M Tuberculosis Complex: NEGATIVE
M avium complex: POSITIVE — AB

## 2024-02-26 LAB — ACID FAST CULTURE WITH REFLEXED SENSITIVITIES (MYCOBACTERIA): Acid Fast Culture: POSITIVE — AB

## 2024-02-26 NOTE — Telephone Encounter (Signed)
 Patient left message returning call. Attempted to call patient- no answer, left message.

## 2024-02-26 NOTE — Telephone Encounter (Signed)
 Patient returned call to get scheduled in the Pulmonary Rehab Program. Patient will come in for orientation on 1/26 and will attend the 10:15 exercise class.  Pensions consultant.

## 2024-03-02 ENCOUNTER — Telehealth (HOSPITAL_COMMUNITY): Payer: Self-pay

## 2024-03-07 ENCOUNTER — Encounter (HOSPITAL_COMMUNITY)

## 2024-03-08 ENCOUNTER — Ambulatory Visit: Admitting: Obstetrics and Gynecology

## 2024-03-10 ENCOUNTER — Other Ambulatory Visit: Payer: Self-pay

## 2024-03-10 ENCOUNTER — Ambulatory Visit: Payer: Self-pay | Admitting: Internal Medicine

## 2024-03-10 ENCOUNTER — Telehealth (HOSPITAL_COMMUNITY): Payer: Self-pay

## 2024-03-10 VITALS — BP 165/103 | HR 68 | Temp 97.8°F | Ht 62.0 in | Wt 164.0 lb

## 2024-03-10 DIAGNOSIS — J849 Interstitial pulmonary disease, unspecified: Secondary | ICD-10-CM

## 2024-03-10 DIAGNOSIS — A310 Pulmonary mycobacterial infection: Secondary | ICD-10-CM

## 2024-03-10 NOTE — Progress Notes (Signed)
 "       [facility]  Reason for Consult:MAC lung Referring Provider: Ramaswamy    Patient Active Problem List   Diagnosis Date Noted   Pulmonary nodule 1 cm or greater in diameter 11/26/2023   Acute cystitis with hematuria 09/27/2023   Acute pain of right hip 09/16/2023   Acute pain of left knee 09/16/2023   Fall 09/16/2023   COVID-19 vaccination declined 09/16/2023   Nausea vomiting and diarrhea 08/24/2023   Community acquired pneumonia 08/24/2023   Urge incontinence 08/17/2023   Syncope and collapse 08/09/2023   Interstitial lung disease (HCC) 06/09/2023   Myalgia 04/28/2023   Near syncope 04/15/2023   Viral gastroenteritis 04/15/2023   Postconcussion syndrome 12/16/2022   Syncope 12/03/2022   Acute lower UTI 12/03/2022   Cavitating mass in right upper lung lobe 12/03/2022   Dyslipidemia 12/03/2022   Coronary artery disease 12/03/2022   Chronic obstructive pulmonary disease (COPD) (HCC) 12/03/2022   Cavitary pneumonia 12/03/2022   Atherosclerosis of aorta 10/05/2022   Left leg pain 10/05/2022   Pain of toe of left foot 10/05/2022   Current moderate episode of major depressive disorder, unspecified whether recurrent (HCC) 09/22/2022   Abdominal hernia 05/15/2022   Candidal vulvovaginitis 05/15/2022   Fracture of foot 05/15/2022   Hearing loss 05/15/2022   Hypertensive heart disease without heart failure 05/15/2022   Menopausal syndrome 05/15/2022   Other long term (current) drug therapy 05/15/2022   Postherpetic trigeminal neuralgia 05/15/2022   Swollen abdomen 05/15/2022   Type 2 diabetes mellitus with hyperglycemia (HCC) 05/15/2022   Urinary incontinence 05/15/2022   Diabetic polyneuropathy associated with type 2 diabetes mellitus (HCC) 12/15/2021   Oxygen  dependent 12/15/2021   History of fall 12/15/2021   Chronic arthritis 12/15/2021   Dyslipidemia associated with type 2 diabetes mellitus (HCC) 11/05/2021   Generalized weakness  11/05/2021   Pneumonitis 11/05/2021   Postnasal drip 10/18/2021   Low hemoglobin 10/18/2021   Positive ANA (antinuclear antibody) 10/18/2021   BMI 29.0-29.9,adult 10/18/2021   IPF (idiopathic pulmonary fibrosis) (HCC) 08/20/2021   Urinary frequency 06/19/2021   Urinary tract infection without hematuria 06/19/2021   Imbalance 06/19/2021   Memory changes 06/19/2021   Drug therapy 06/19/2021   Back pain 05/24/2020   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 heart failure (HCC) 04/16/2020   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) (HCC) 04/13/2020   Chronic diastolic heart failure (HCC) 03/28/2020   Uncontrolled type 2 diabetes mellitus with hyperglycemia (HCC) 07/25/2019   Hiatal hernia 07/25/2019   Spontaneous ecchymoses 07/25/2019   Class 1 obesity due to excess calories with serious comorbidity and body mass index (BMI) of 30.0 to 30.9 in adult 07/25/2019   Peripheral vascular disease, unspecified 03/23/2019   Uveitis 06/01/2017   Fibromyalgia 06/01/2017   DDD (degenerative disc disease), lumbar 06/01/2017   DDD (degenerative disc disease), thoracic 06/01/2017   History of hypercholesterolemia 06/01/2017   History of anxiety 06/01/2017   History of IBS 06/01/2017   Premature atrial contractions 03/30/2017   PVC's (premature ventricular contractions) 03/30/2017   Diabetes mellitus without complication (HCC) 01/28/2017   Angina pectoris 01/15/2017   CAD (coronary artery disease) 01/12/2017   Shortness of breath 12/12/2016   Nuclear sclerotic cataract of left eye 11/16/2015   Status post eye surgery 11/16/2015   Primary open angle glaucoma of left eye, moderate stage 10/31/2015   Chronic iritis of both eyes 08/22/2015   Cystoid macular edema of right eye 08/22/2015   Acute  maxillary sinusitis 05/03/2014   Obstructive sleep apnea 11/04/2013   Multiple lung nodules 07/18/2012   Heart palpitations 03/11/2012   HTN (hypertension)  03/11/2012   Unspecified glaucoma 08/29/2011   Gastroesophageal reflux disease without esophagitis 07/13/2011   Sarcoidosis 02/08/2011   Hyperlipemia 09/24/2010   Atypical chest pain 09/24/2010   Leg edema 09/24/2010      HPI: Sheila Reynolds is a 79 y.o. 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, here for MAC lung management  Inflammatory arthritis: She had had prednisone  high dose in the past She is also on azathioprine  since 2023  Sarcoid: Dx'ed 1980s; initially in the eyes but also involved lung Currently not active Serial ct showed pulmonary fibrosis Reviewed dr Geronimo 02/23/24 note exposure history/rheum history UIP pattern pulm fibrosis ?primary vs secondary due to sarcoid vs both Pft restrictive pattern  I agree with dr Geronimo that inflammatory arthritis could be related to sarcoid  She reports still lots of pain in hands, knees, hips and described inflammatory arthritis feature that improves with activities. She is not sure the immuran is helping. She describe another infusion every 6 months (only had once).  I reviewed 11/2023 dr Curt note and she got first dose rituximab  05/2023. Dr Geronimo tested quantiferon gold 02/2024 which was negative. 2019 hep b serology negative  Functional status:  -Not on o2 -Short of breath with as of 03/10/24: Half a flight of stairs   Sweeping the house/doing laundry Walking 25 feet flat ground Self care (putting on cloths) -6 months ago able to walk up the driveway -2 years ago all activities above was fine   Chronic years of cough -- daily, productive. Nothing changed this year No fever, chill, drenching nightsweat No weight loss  Appetite not great though. 6 months ago ago better appetite    Review of Systems: ROS All other ros negative      Past Medical History:  Diagnosis Date   Anemia    3 months ago anemic   Anxiety    on meds   Arthritis    all over (01/15/2017)   Asthma    Bronchitis  with emphysema    Chest pain    Chronic bronchitis (HCC)    Coronary artery disease    a. 01/2017 she underwent orbital atherectomy/DES to the proxmal LAD and PTCA to ostial D2. 2D Echo 01/15/17 showed mild LVH, EF 60-65%, grade 1 DD.   Family history of anesthesia complication    daughter N/V   Fibromyalgia    GERD (gastroesophageal reflux disease)    on meds   Glaucoma    Heart murmur    History of hiatal hernia    Hx of echocardiogram    Echo (03/2013):  Tech limited; Mild focal basal septal hypertrophy, EF 60-65%, normal RVF   Hyperlipidemia    Hypertension    Nausea vomiting and diarrhea 08/24/2023   Nonsustained ventricular tachycardia (HCC)    OSA on CPAP    Premature atrial contractions    PVC (premature ventricular contraction)    a. Holter 12/16: NSR, occ PAC,PVCs   Sarcoidosis    TIA (transient ischemic attack)    Type II diabetes mellitus (HCC)     Social History[1]  Family History  Problem Relation Age of Onset   Heart disease Father    Diabetes Father    Glaucoma Father    Cancer Mother        unknown type, ?lung   Heart disease Paternal Grandmother    Cancer  Paternal Grandmother        unknown type   Diabetes Brother    Glaucoma Brother     Allergies[2]  OBJECTIVE: Vitals:   03/10/24 1018  BP: (!) 165/103  Pulse: 68  Temp: 97.8 F (36.6 C)  TempSrc: Temporal  SpO2: 99%  Weight: 164 lb (74.4 kg)  Height: 5' 2 (1.575 m)   Body mass index is 30 kg/m.   Physical Exam General/constitutional: no distress, pleasant; fully conversant; noticed no cough; walks with cane HEENT: Normocephalic, PER, Conj Clear, EOMI, Oropharynx clear Neck supple CV: rrr no mrg Lungs: clear to auscultation, normal respiratory effort Abd: Soft, Nontender Ext: no edema Skin: No Rash Neuro: nonfocal MSK: no peripheral joint swelling/tenderness/warmth; back spines nontender  Lab: Lab Results  Component Value Date   WBC 5.1 12/08/2023   HGB 10.8 (L) 12/08/2023    HCT 34.3 (L) 12/08/2023   MCV 86.6 12/08/2023   PLT 188 12/08/2023   Last metabolic panel Lab Results  Component Value Date   GLUCOSE 91 12/24/2023   NA 138 12/24/2023   K 4.8 12/24/2023   CL 103 12/24/2023   CO2 28 12/24/2023   BUN 11 12/24/2023   CREATININE 0.86 12/24/2023   GFR 65.60 12/24/2023   CALCIUM  9.5 12/24/2023   PHOS 2.7 08/11/2023   PROT 6.5 08/09/2023   ALBUMIN 3.2 (L) 08/11/2023   LABGLOB 2.2 02/23/2023   AGRATIO 1.7 06/13/2021   BILITOT 1.5 (H) 08/09/2023   ALKPHOS 65 08/09/2023   AST 34 08/09/2023   ALT 24 08/09/2023   ANIONGAP 9 12/08/2023    Microbiology: 01/11/24 bronchoscopy afb culture Organism ID Comment Abnormal   Comment: Mycobacterium avium complex  Amikacin Comment  Comment: (NOTE) 16.0 ug/mL Susceptible Breakpoints have been established for only some organism-drug combinations as indicated. This test was developed and its performance characteristics determined by [facility]. It has not been cleared or approved by the Food and Drug Administration. For Mycobacterium avium-intracellulare complex, the interpretive criteria for amikacin apply to IV treatment. If using amikacin (liposomal, inhaled), the MIC interpretive breakpoints are <= 64ug/mL Susceptible, >= 128 ug/mL Resistant (CLSI M24S).  Ciprofloxacin  2.0 ug/mL  Comment: (NOTE) Breakpoints have been established for only some organism-drug combinations as indicated. This test was developed and its performance characteristics determined by [facility]. It has not been cleared or approved by the Food and Drug Administration.  Clarithromycin 1.0 ug/mL Susceptible  Comment: (NOTE) Breakpoints have been established for only some organism-drug combinations as indicated. This test was developed and its performance characteristics determined by [facility]. It has not been cleared or approved by the Food and Drug Administration.  Clofazimine NOT PERFORMED  Comment: (NOTE) Test not  performed Breakpoints have been established for only some organism-drug combinations as indicated. This test was developed and its performance characteristics determined by [facility]. It has not been cleared or approved by the Food and Drug Administration.  Doxycycline  8.0 ug/mL  Comment: (NOTE) Breakpoints have been established for only some organism-drug combinations as indicated. This test was developed and its performance characteristics determined by [facility]. It has not been cleared or approved by the Food and Drug Administration.  Linezolid 32.0 ug/mL Resistant  Comment: (NOTE) Breakpoints have been established for only some organism-drug combinations as indicated. This test was developed and its performance characteristics determined by [facility]. It has not been cleared or approved by the Food and Drug Administration.  Minocycline 8.0 ug/mL  Comment: (NOTE) Breakpoints have been established for only some organism-drug combinations as indicated. This  test was developed and its performance characteristics determined by [facility]. It has not been cleared or approved by the Food and Drug Administration.  Moxifloxacin 0.5 ug/mL Susceptible  Comment: (NOTE) Breakpoints have been established for only some organism-drug combinations as indicated. This test was developed and its performance characteristics determined by [facility]. It has not been cleared or approved by the Food and Drug Administration.  Rifabutin 0.5 ug/mL  Comment: (NOTE) Breakpoints have been established for only some organism-drug combinations as indicated. This test was developed and its performance characteristics determined by [facility]. It has not been cleared or approved by the Food and Drug Administration.  Rifampin >4.0 ug/mL  Comment: (NOTE) Breakpoints have been established for only some organism-drug combinations as indicated. This test was developed and its performance characteristics determined by  [facility]. It has not been cleared or approved by the Food and Drug Administration.  Streptomycin 32.0 ug/mL  Comment: (NOTE) Breakpoints have been established for only some organism-drug combinations as indicated. This test was developed and its performance characteristics determined by [facility]. It has not been cleared or approved by the Food and Drug Administration.  Trimethoprim/Sulfa 2/38 ug/mL  Comment: (NOTE) Breakpoints have been established for only some organism-drug combinations as indicated. This test was developed and its performance characteristics determined by [facility]. It has not been cleared or approved by the Food and Drug Administration. Performed At: [facility] [address] [place], [place] [id number] Jennette Shorter MD Ey:[id number]     Serology:  Imaging: Reviewed  12/04/23 ct chest 1. Similarly sized focal area of architectural distortion with nodular septal thickening and mixed solid/cystic composition in the right apex with significantly decreased nodularity, favor evolving inflammatory change related to sarcoidosis; no new suspicious pulmonary nodules or acute infiltrates identified. 2. Stable right upper lobe peribronchial nodularity with traction bronchiolectasis consistent with fibrosis; stable bibasilar subpleural fibrotic change. 3. Mild emphysema (icd-10 j43.9). Low-dose CT lung cancer screening is recommended for patients who are 46-36 years of age with a 20+ pack-year history of smoking and who are currently smoking or quit 15 years ago 4. raf score: emphysema (icd10-j43.9), Aortic atherosclerosis (icd10-i70.0)    10/2023 ct chest 1. Interval increase in irregular consolidation in the posterior right apex, measuring 3.9 x 1.9 cm. Similar clustered centrilobular and tree-in-bud nodularity, particularly in the posterior right upper lobe. Findings are most consistent with worsened atypical infection, however recommend follow-up  in 3 months to assess for stability or resolution of this largest consolidation. 2. Bibasilar predominant fibrosis featuring irregular peripheral interstitial opacity, septal thickening, and minimal subpleural bronchiolectasis at the lung bases without evidence of honeycombing. No specific features of nodal or pulmonary sarcoidosis. 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 Am JINNY [employer] Crit Care Med Vol 198, Iss 5, [phone], Oct 11 2016. 3. Mild emphysema and diffuse bilateral bronchial wall thickening. 4. Coronary artery disease.    Assessment/plan: Problem List Items Addressed This Visit   None Visit Diagnoses       ILD (interstitial lung disease) (HCC)    -  Primary     Mycobacterium avium infection (HCC)             79 yo female with dm2, glaucoma, allergic rhinitis, cad, gerd, sarcoid/ild, inflammatory arthritis on immunosuppression with azathioprine ,  referred by pulm 02/2024 for recent bronchoscopy afb cx MAC   Pft's 06/2023     fvc 1.7 (65%); fev1 1.24 (64%); fev1/fvc 73 12/2022  fev1/fvc 81; fev1 1.2 (62%); fvc 1.49 (59%); tlc 2.89 (60%); dlco 57%   Ct chest: See above   Serial ct from 2024 to 2025 stable Pft stable She does have declining functional status however Cough chronic/stable No sign of b sx otherwise   We discussed criteria for diagnosis of pulm ntm We discussed treatment relatively low efficacy and high relapse rate We also discussed different type of cure -- microbiologic vs quality of care improvement with treatment for ntm   Patient has confounding factors with sarcoid. I agree that the sarcoid could be causing the ILD changes and also the inflammatory arthritis. She is on azathioprine  and only had one dose of rituximab  05/2023  I would like to see what rheumatology and pulmonary think about being aggressive with  inflammatory arthritis and ?sarcoid tx. Reassess her for the next year or so by pft/clinical functional status and serial ct with the sarcoid/inflammatory arthritis treatment  If she continues to have decline in pulmonary status then we could consider MAC tx at that time  Will forward chart to rheum/pulm  Will follow up in late 04/2024 and review their recommendations   Follow-up: Return in about 7 weeks (around 04/28/2024).  Constance ONEIDA Passer, MD [facility] 03/10/2024, 10:37 AM     [1]  Social History Tobacco Use   Smoking status: Former    Current packs/day: 0.00    Average packs/day: 1 pack/day for 4.0 years (4.0 ttl pk-yrs)    Types: Cigarettes    Start date: 02/11/1976    Quit date: 02/11/1980    Years since quitting: 44.1   Smokeless tobacco: Never  Vaping Use   Vaping status: Never Used  Substance Use Topics   Alcohol  use: No   Drug use: No  [2]  Allergies Allergen Reactions   Crestor [Rosuvastatin Calcium ] Other (See Comments)    muscle aches   Demerol  [Meperidine Hcl]     Other reaction(s): Hallucinations   Shellfish Allergy Itching and Other (See Comments)    Crab, shrimp and lobster ---lips itch and tingle Was told not to eat again after having a allergy test. Lobster, crab and shrimp    "

## 2024-03-10 NOTE — Patient Instructions (Signed)
 This Bacteria they found in your lung might be a colonizer. If it was causing trouble, it would be very slow.  So far what I am concerned about is the sarcoid/inflammatory arthritis that is causing lung issue as well   I would like for pulmonary and rheumatology to see if they can maximize treatment of sarcoid/inflamatory arthritis. If there is no improvement in lung function with that, we can consider treatment for the bacteria    See me in mid march about 6-8 weeks from now

## 2024-03-10 NOTE — Telephone Encounter (Signed)
 Called to confirm appt. Pt confirmed appt. Instructed pt on proper footwear. Gave directions along with department number.

## 2024-03-11 ENCOUNTER — Encounter (HOSPITAL_COMMUNITY): Payer: Self-pay

## 2024-03-11 ENCOUNTER — Encounter (HOSPITAL_COMMUNITY)
Admission: RE | Admit: 2024-03-11 | Discharge: 2024-03-11 | Disposition: A | Source: Ambulatory Visit | Attending: Internal Medicine

## 2024-03-11 ENCOUNTER — Ambulatory Visit: Payer: Self-pay | Admitting: Internal Medicine

## 2024-03-11 VITALS — BP 120/60 | HR 85 | Wt 167.8 lb

## 2024-03-11 DIAGNOSIS — J849 Interstitial pulmonary disease, unspecified: Secondary | ICD-10-CM | POA: Insufficient documentation

## 2024-03-11 NOTE — Progress Notes (Signed)
 Sheila Reynolds 79 y.o. female Pulmonary Rehab Orientation Note This patient who was referred to Pulmonary Rehab by Dr. Geronimo with the diagnosis of ILD arrived today in Cardiac and Pulmonary Rehab. She  arrived ambulatory with assistive device with normal gait. She  does not carry portable oxygen .  Per patient, Sheila Reynolds uses oxygen  never. Color good, skin warm and dry. Patient is oriented to time and place. Patient's medical history, psychosocial health, and medications reviewed. Psychosocial assessment reveals patient lives with spouse. Sheila Reynolds is currently retired. Patient hobbies include watching tv and spending time with others. Patient reports her stress level is low. Areas of stress/anxiety include health. Patient does not exhibit signs of depression. PHQ2/9 score 0/2. Sheila Reynolds shows good  coping skills with positive outlook on life. Offered emotional support and reassurance. Will continue to monitor. Physical assessment performed by Nurse pick: Ronal Levin RN. Please see their orientation physical assessment note. Sheila Reynolds reports she does take medications as prescribed. Patient states she follows a regular  diet. The patient would like to lose weight. Patient's weight will be monitored closely. Demonstration and practice of PLB using pulse oximeter. Sheila Reynolds able to return demonstration satisfactorily. Safety and hand hygiene in the exercise area reviewed with patient. Sheila Reynolds voices understanding of the information reviewed. Department expectations discussed with patient and achievable goals were set. The patient shows enthusiasm about attending the program and we look forward to working with Sheila. Sheila Reynolds completed a 6 min walk test today and is scheduled to begin exercise on 03/15/24.   1300-1430 Sheila Reynolds, BSRT

## 2024-03-11 NOTE — Progress Notes (Signed)
 Pulmonary Individual Treatment Plan  Patient Details  Name: Sheila Reynolds MRN: [id number] Date of Birth: [birth year] Referring Provider:   Flowsheet Row PULMONARY REHAB OTHER RESP ORIENTATION from 03/11/2024 in [facility] for Heart, Vascular, & Lung Health  Referring Provider Ramaswamy    Initial Encounter Date:  Flowsheet Row PULMONARY REHAB OTHER RESP ORIENTATION from 03/11/2024 in [facility] for Heart, Vascular, & Lung Health  Date 03/11/24    Visit Diagnosis: ILD (interstitial lung disease) (HCC)  Patient's Home Medications on Admission:  Current Medications[1]  Past Medical History: Past Medical History:  Diagnosis Date   Anemia    3 months ago anemic   Anxiety    on meds   Arthritis    all over (01/15/2017)   Asthma    Bronchitis with emphysema    Chest pain    Chronic bronchitis (HCC)    Coronary artery disease    a. 01/2017 she underwent orbital atherectomy/DES to the proxmal LAD and PTCA to ostial D2. 2D Echo 01/15/17 showed mild LVH, EF 60-65%, grade 1 DD.   Family history of anesthesia complication    daughter N/V   Fibromyalgia    GERD (gastroesophageal reflux disease)    on meds   Glaucoma    Heart murmur    History of hiatal hernia    Hx of echocardiogram    Echo (03/2013):  Tech limited; Mild focal basal septal hypertrophy, EF 60-65%, normal RVF   Hyperlipidemia    Hypertension    Nausea vomiting and diarrhea 08/24/2023   Nonsustained ventricular tachycardia (HCC)    OSA on CPAP    Premature atrial contractions    PVC (premature ventricular contraction)    a. Holter 12/16: NSR, occ PAC,PVCs   Sarcoidosis    TIA (transient ischemic attack)    Type II diabetes mellitus (HCC)     Tobacco Use: Tobacco Use History[2]  Labs: Review Flowsheet  More data exists      Latest Ref Rng & Units 02/23/2023 04/15/2023 05/25/2023 08/10/2023 01/14/2024  Labs for ITP Cardiac and Pulmonary Rehab  Cholestrol 0 -  200 mg/dL 819  - 883  859  -  LDL (calc) 0 - 99 mg/dL 887  - 61  78  -  HDL-C >40 mg/dL 46  - 41  50  -  Trlycerides <150 mg/dL 874  - 64  62  -  Hemoglobin A1c 4.8 - 5.6 % - 6.2  - 6.2  6.2     Capillary Blood Glucose: Lab Results  Component Value Date   GLUCAP 118 (H) 01/11/2024   GLUCAP 119 (H) 01/11/2024   GLUCAP 136 (H) 12/08/2023   GLUCAP 84 08/11/2023   GLUCAP 203 (H) 08/11/2023     Pulmonary Assessment Scores:  Pulmonary Assessment Scores     Row Name 03/11/24 1346         ADL UCSD   ADL Phase Entry     SOB Score total 51       CAT Score   CAT Score 17       mMRC Score   mMRC Score 4       UCSD: Self-administered rating of dyspnea associated with activities of daily living (ADLs) 6-point scale (0 = not at all to 5 = maximal or unable to do because of breathlessness)  Scoring Scores range from 0 to 120.  Minimally important difference is 5 units  CAT: CAT can identify the health impairment of COPD  patients and is better correlated with disease progression.  CAT has a scoring range of zero to 40. The CAT score is classified into four groups of low (less than 10), medium (10 - 20), high (21-30) and very high (31-40) based on the impact level of disease on health status. A CAT score over 10 suggests significant symptoms.  A worsening CAT score could be explained by an exacerbation, poor medication adherence, poor inhaler technique, or progression of COPD or comorbid conditions.  CAT MCID is 2 points  mMRC: mMRC (Modified Medical Research Council) Dyspnea Scale is used to assess the degree of baseline functional disability in patients of respiratory disease due to dyspnea. No minimal important difference is established. A decrease in score of 1 point or greater is considered a positive change.   Pulmonary Function Assessment:  Pulmonary Function Assessment - 03/11/24 1355       Breath   Bilateral Breath Sounds Clear    Shortness of Breath Yes;Limiting  activity          Exercise Target Goals: Exercise Program Goal: Individual exercise prescription set using results from initial 6 min walk test and THRR while considering  patients activity barriers and safety.   Exercise Prescription Goal: Initial exercise prescription builds to 30-45 minutes a day of aerobic activity, 2-3 days per week.  Home exercise guidelines will be given to patient during program as part of exercise prescription that the participant will acknowledge.  Activity Barriers & Risk Stratification:  Activity Barriers & Cardiac Risk Stratification - 03/11/24 1354       Activity Barriers & Cardiac Risk Stratification   Activity Barriers Deconditioning;Muscular Weakness;Shortness of Breath;Arthritis;Balance Concerns;History of Falls;Assistive Device    Cardiac Risk Stratification Moderate          6 Minute Walk:  6 Minute Walk     Row Name 03/11/24 1446         6 Minute Walk   Phase Initial     Distance 725 feet     Walk Time 6 minutes     # of Rest Breaks 0     MPH 1.37     METS 1.41     RPE 13     Perceived Dyspnea  1     VO2 Peak 4.93     Symptoms No     Resting HR 85 bpm     Resting BP 120/60     Resting Oxygen  Saturation  99 %     Exercise Oxygen  Saturation  during 6 min walk 97 %     Max Ex. HR 101 bpm     Max Ex. BP 142/68     2 Minute Post BP 134/66       Interval HR   1 Minute HR 94     2 Minute HR 100     3 Minute HR 100     4 Minute HR 98     5 Minute HR 101     6 Minute HR 101     2 Minute Post HR 75     Interval Heart Rate? Yes       Interval Oxygen    Interval Oxygen ? Yes     Baseline Oxygen  Saturation % 99 %     1 Minute Oxygen  Saturation % 99 %     1 Minute Liters of Oxygen  0 L     2 Minute Oxygen  Saturation % 99 %     2 Minute Liters of Oxygen  0  L     3 Minute Oxygen  Saturation % 98 %     3 Minute Liters of Oxygen  0 L     4 Minute Oxygen  Saturation % 97 %     4 Minute Liters of Oxygen  0 L     5 Minute Oxygen   Saturation % 98 %     5 Minute Liters of Oxygen  0 L     6 Minute Oxygen  Saturation % 99 %     6 Minute Liters of Oxygen  0 L     2 Minute Post Oxygen  Saturation % 98 %     2 Minute Post Liters of Oxygen  0 L        Oxygen  Initial Assessment:  Oxygen  Initial Assessment - 03/11/24 1355       Home Oxygen    Home Oxygen  Device None    Sleep Oxygen  Prescription CPAP    Home Exercise Oxygen  Prescription None    Home Resting Oxygen  Prescription None      Initial 6 min Walk   Oxygen  Used None      Program Oxygen  Prescription   Program Oxygen  Prescription None      Intervention   Short Term Goals To learn and understand importance of monitoring SPO2 with pulse oximeter and demonstrate accurate use of the pulse oximeter.;To learn and understand importance of maintaining oxygen  saturations>88%;To learn and demonstrate proper pursed lip breathing techniques or other breathing techniques. ;To learn and demonstrate proper use of respiratory medications    Long  Term Goals Maintenance of O2 saturations>88%;Compliance with respiratory medication;Verbalizes importance of monitoring SPO2 with pulse oximeter and return demonstration;Exhibits proper breathing techniques, such as pursed lip breathing or other method taught during program session;Demonstrates proper use of MDIs          Oxygen  Re-Evaluation:   Oxygen  Discharge (Final Oxygen  Re-Evaluation):   Initial Exercise Prescription:  Initial Exercise Prescription - 03/11/24 1400       Date of Initial Exercise RX and Referring Provider   Date 03/11/24    Referring Provider Ramaswamy    Expected Discharge Date 06/06/24      NuStep   Level 1    SPM 45    Minutes 30    METs 1.5      Prescription Details   Frequency (times per week) 2    Duration Progress to 30 minutes of continuous aerobic without signs/symptoms of physical distress      Intensity   THRR 40-80% of Max Heartrate 58-115    Ratings of Perceived Exertion 11-13     Perceived Dyspnea 0-4      Progression   Progression Continue to progress workloads to maintain intensity without signs/symptoms of physical distress.      Resistance Training   Training Prescription Yes    Weight Red bands    Reps 10-15          Perform Capillary Blood Glucose checks as needed.  Exercise Prescription Changes:   Exercise Comments:   Exercise Goals and Review:   Exercise Goals     Row Name 03/11/24 1329             Exercise Goals   Increase Physical Activity Yes       Intervention Provide advice, education, support and counseling about physical activity/exercise needs.;Develop an individualized exercise prescription for aerobic and resistive training based on initial evaluation findings, risk stratification, comorbidities and participant's personal goals.       Expected Outcomes Short Term: Attend rehab on  a regular basis to increase amount of physical activity.;Long Term: Add in home exercise to make exercise part of routine and to increase amount of physical activity.;Long Term: Exercising regularly at least 3-5 days a week.       Increase Strength and Stamina Yes       Intervention Provide advice, education, support and counseling about physical activity/exercise needs.;Develop an individualized exercise prescription for aerobic and resistive training based on initial evaluation findings, risk stratification, comorbidities and participant's personal goals.       Expected Outcomes Short Term: Increase workloads from initial exercise prescription for resistance, speed, and METs.;Short Term: Perform resistance training exercises routinely during rehab and add in resistance training at home;Long Term: Improve cardiorespiratory fitness, muscular endurance and strength as measured by increased METs and functional capacity ( )       Able to understand and use rate of perceived exertion (RPE) scale Yes       Intervention Provide education and explanation on how  to use RPE scale       Expected Outcomes Short Term: Able to use RPE daily in rehab to express subjective intensity level;Long Term:  Able to use RPE to guide intensity level when exercising independently       Able to understand and use Dyspnea scale Yes       Intervention Provide education and explanation on how to use Dyspnea scale       Expected Outcomes Short Term: Able to use Dyspnea scale daily in rehab to express subjective sense of shortness of breath during exertion;Long Term: Able to use Dyspnea scale to guide intensity level when exercising independently       Knowledge and understanding of Target Heart Rate Range (THRR) Yes       Intervention Provide education and explanation of THRR including how the numbers were predicted and where they are located for reference       Expected Outcomes Short Term: Able to state/look up THRR;Short Term: Able to use daily as guideline for intensity in rehab;Long Term: Able to use THRR to govern intensity when exercising independently       Understanding of Exercise Prescription Yes       Intervention Provide education, explanation, and written materials on patient's individual exercise prescription       Expected Outcomes Short Term: Able to explain program exercise prescription;Long Term: Able to explain home exercise prescription to exercise independently          Exercise Goals Re-Evaluation :   Discharge Exercise Prescription (Final Exercise Prescription Changes):   Nutrition:  Target Goals: Understanding of nutrition guidelines, daily intake of sodium 1500mg , cholesterol 200mg , calories 30% from fat and 7% or less from saturated fats, daily to have 5 or more servings of fruits and vegetables.  Biometrics:    Nutrition Therapy Plan and Nutrition Goals:   Nutrition Assessments:  MEDIFICTS Score Key: >=70 Need to make dietary changes  40-70 Heart Healthy Diet <= 40 Therapeutic Level Cholesterol Diet   Picture Your Plate  Scores: <59 Unhealthy dietary pattern with much room for improvement. 41-50 Dietary pattern unlikely to meet recommendations for good health and room for improvement. 51-60 More healthful dietary pattern, with some room for improvement.  >60 Healthy dietary pattern, although there may be some specific behaviors that could be improved.    Nutrition Goals Re-Evaluation:   Nutrition Goals Discharge (Final Nutrition Goals Re-Evaluation):   Psychosocial: Target Goals: Acknowledge presence or absence of significant depression and/or stress, maximize coping skills,  provide positive support system. Participant is able to verbalize types and ability to use techniques and skills needed for reducing stress and depression.  Initial Review & Psychosocial Screening:  Initial Psych Review & Screening - 03/11/24 1352       Initial Review   Current issues with None Identified      Family Dynamics   Good Support System? Yes    Comments spouse, children, friends       Barriers   Psychosocial barriers to participate in program There are no identifiable barriers or psychosocial needs.      Screening Interventions   Interventions Encouraged to exercise    Expected Outcomes Short Term goal: Identification and review with participant of any Quality of Life or Depression concerns found by scoring the questionnaire.;Long Term goal: The participant improves quality of Life and PHQ9 Scores as seen by post scores and/or verbalization of changes          Quality of Life Scores:  Scores of 19 and below usually indicate a poorer quality of life in these areas.  A difference of  2-3 points is a clinically meaningful difference.  A difference of 2-3 points in the total score of the Quality of Life Index has been associated with significant improvement in overall quality of life, self-image, physical symptoms, and general health in studies assessing change in quality of life.  PHQ-9: Review Flowsheet  More  data exists      03/11/2024 01/14/2024 09/24/2023 08/17/2023 06/30/2023  Depression screen PHQ 2/9  Decreased Interest 0 0 2 2 1   Down, Depressed, Hopeless 0 0 2 2 1   PHQ - 2 Score 0 0 4 4 2   Altered sleeping 0 0 3 3 -  Tired, decreased energy 0 0 3 3 -  Change in appetite 0 0 2 2 -  Feeling bad or failure about yourself  2 0 0 0 -  Trouble concentrating 0 0 0 0 -  Moving slowly or fidgety/restless 0 0 0 0 -  Suicidal thoughts 0 0 0 0 -  PHQ-9 Score 2 0 12  12  -  Difficult doing work/chores Very difficult Not difficult at all Not difficult at all Somewhat difficult -    Details       Data saved with a previous flowsheet row definition        Interpretation of Total Score  Total Score Depression Severity:  1-4 = Minimal depression, 5-9 = Mild depression, 10-14 = Moderate depression, 15-19 = Moderately severe depression, 20-27 = Severe depression   Psychosocial Evaluation and Intervention:  Psychosocial Evaluation - 03/11/24 1422       Psychosocial Evaluation & Interventions   Interventions Encouraged to exercise with the program and follow exercise prescription    Comments Vee denies any psy/soc barriers or concerns at this time. She feels her mental health is stable at this time. She has good support from her family and friends.    Expected Outcomes For Vee to participate in PR with no psy/soc barriers or concerns    Continue Psychosocial Services  No Follow up required          Psychosocial Re-Evaluation:   Psychosocial Discharge (Final Psychosocial Re-Evaluation):   Education: Education Goals: Education classes will be provided on a weekly basis, covering required topics. Participant will state understanding/return demonstration of topics presented.  Learning Barriers/Preferences:  Learning Barriers/Preferences - 03/11/24 1352       Learning Barriers/Preferences   Learning Barriers Sight;Hearing   wears  glasses and hearing aid   Learning Preferences None           Education Topics: Know Your Numbers Group instruction that is supported by a PowerPoint presentation. Instructor discusses importance of knowing and understanding resting, exercise, and post-exercise oxygen  saturation, heart rate, and blood pressure. Oxygen  saturation, heart rate, blood pressure, rating of perceived exertion, and dyspnea are reviewed along with a normal range for these values.    Exercise for the Pulmonary Patient Group instruction that is supported by a PowerPoint presentation. Instructor discusses benefits of exercise, core components of exercise, frequency, duration, and intensity of an exercise routine, importance of utilizing pulse oximetry during exercise, safety while exercising, and options of places to exercise outside of rehab.  Flowsheet Row PULMONARY REHAB OTHER RESPIRATORY from 11/22/2020 in [facility] for Heart, Vascular, & Lung Health  Date 11/15/20  Educator handout    MET Level  Group instruction provided by PowerPoint, verbal discussion, and written material to support subject matter. Instructor reviews what METs are and how to increase METs.    Pulmonary Medications Verbally interactive group education provided by instructor with focus on inhaled medications and proper administration.   Anatomy and Physiology of the Respiratory System Group instruction provided by PowerPoint, verbal discussion, and written material to support subject matter. Instructor reviews respiratory cycle and anatomical components of the respiratory system and their functions. Instructor also reviews differences in obstructive and restrictive respiratory diseases with examples of each.  Flowsheet Row PULMONARY REHAB OTHER RESPIRATORY from 12/13/2020 in [facility] for Heart, Vascular, & Lung Health  Date 12/13/20  Educator Johnnie  [Handout]  Instruction Review Code 1- Verbalizes Understanding    Oxygen  Safety Group  instruction provided by PowerPoint, verbal discussion, and written material to support subject matter. There is an overview of What is Oxygen  and Why do we need it.  Instructor also reviews how to create a safe environment for oxygen  use, the importance of using oxygen  as prescribed, and the risks of noncompliance. There is a brief discussion on traveling with oxygen  and resources the patient may utilize.   Oxygen  Use Group instruction provided by PowerPoint, verbal discussion, and written material to discuss how supplemental oxygen  is prescribed and different types of oxygen  supply systems. Resources for more information are provided.    Breathing Techniques Group instruction that is supported by demonstration and informational handouts. Instructor discusses the benefits of pursed lip and diaphragmatic breathing and detailed demonstration on how to perform both.     Risk Factor Reduction Group instruction that is supported by a PowerPoint presentation. Instructor discusses the definition of a risk factor, different risk factors for pulmonary disease, and how the heart and lungs work together. Flowsheet Row PULMONARY REHAB OTHER RESPIRATORY from 11/22/2020 in [facility] for Heart, Vascular, & Lung Health  Date 10/18/20  Educator Handout    Pulmonary Diseases Group instruction provided by PowerPoint, verbal discussion, and written material to support subject matter. Instructor gives an overview of the different type of pulmonary diseases. There is also a discussion on risk factors and symptoms as well as ways to manage the diseases.   Stress and Energy Conservation Group instruction provided by PowerPoint, verbal discussion, and written material to support subject matter. Instructor gives an overview of stress and the impact it can have on the body. Instructor also reviews ways to reduce stress. There is also a discussion on energy conservation and ways to conserve  energy throughout  the day.   Warning Signs and Symptoms Group instruction provided by PowerPoint, verbal discussion, and written material to support subject matter. Instructor reviews warning signs and symptoms of stroke, heart attack, cold and flu. Instructor also reviews ways to prevent the spread of infection.   Other Education Group or individual verbal, written, or video instructions that support the educational goals of the pulmonary rehab program. Flowsheet Row PULMONARY REHAB OTHER RESPIRATORY from 11/22/2020 in [facility] for Heart, Vascular, & Lung Health  Date 11/22/20  Educator Johnnie  [facility] handout]     Knowledge Questionnaire Score:  Knowledge Questionnaire Score - 03/11/24 1341       Knowledge Questionnaire Score   Pre Score 16/18          Core Components/Risk Factors/Patient Goals at Admission:  Personal Goals and Risk Factors at Admission - 03/11/24 1353       Core Components/Risk Factors/Patient Goals on Admission    Weight Management Yes;Weight Loss    Intervention Weight Management: Develop a combined nutrition and exercise program designed to reach desired caloric intake, while maintaining appropriate intake of nutrient and fiber, sodium and fats, and appropriate energy expenditure required for the weight goal.;Weight Management: Provide education and appropriate resources to help participant work on and attain dietary goals.;Weight Management/Obesity: Establish reasonable short term and long term weight goals.;Obesity: Provide education and appropriate resources to help participant work on and attain dietary goals.    Admit Weight 167 lb 12.3 oz (76.1 kg)    Expected Outcomes Short Term: Continue to assess and modify interventions until short term weight is achieved;Long Term: Adherence to nutrition and physical activity/exercise program aimed toward attainment of established weight goal;Weight Loss: Understanding of general  recommendations for a balanced deficit meal plan, which promotes 1-2 lb weight loss per week and includes a negative energy balance of [phone] kcal/d;Understanding recommendations for meals to include 15-35% energy as protein, 25-35% energy from fat, 35-60% energy from carbohydrates, less than 200mg  of dietary cholesterol, 20-35 gm of total fiber daily;Understanding of distribution of calorie intake throughout the day with the consumption of 4-5 meals/snacks    Improve shortness of breath with ADL's Yes    Intervention Provide education, individualized exercise plan and daily activity instruction to help decrease symptoms of SOB with activities of daily living.    Expected Outcomes Short Term: Improve cardiorespiratory fitness to achieve a reduction of symptoms when performing ADLs;Long Term: Be able to perform more ADLs without symptoms or delay the onset of symptoms          Core Components/Risk Factors/Patient Goals Review:    Core Components/Risk Factors/Patient Goals at Discharge (Final Review):    ITP Comments:   Comments: Dr. Slater Staff is Medical Director for Pulmonary Rehab at [facility].       [1]  Current Outpatient Medications:    acetaminophen  (TYLENOL ) 500 MG tablet, Take 1,000 mg by mouth 2 (two) times daily as needed for moderate pain or headache., Disp: , Rfl:    albuterol  (PROVENTIL ) (2.5 MG/3ML) 0.083% nebulizer solution, Take 3 mLs (2.5 mg total) by nebulization daily as needed for wheezing or shortness of breath., Disp: 75 mL, Rfl: 5   albuterol  (VENTOLIN  HFA) 108 (90 Base) MCG/ACT inhaler, Inhale 2 puffs into the lungs every 6 (six) hours as needed for wheezing or shortness of breath., Disp: 18 g, Rfl: 12   aspirin  EC 81 MG tablet, Take 81 mg by mouth daily. Swallow whole., Disp: , Rfl:  atorvastatin  (LIPITOR) 80 MG tablet, Take 1 tablet (80 mg total) by mouth daily., Disp: 90 tablet, Rfl: 3   azaTHIOprine  (IMURAN ) 50 MG tablet, Take 1 tablet by mouth  daily., Disp: , Rfl:    Blood Glucose Monitoring Suppl (ACCU-CHEK AVIVA PLUS) w/Device KIT, Use to check blood sugars 3 times a day. Dx code e11.65, Disp: 1 kit, Rfl: 3   Blood Glucose Monitoring Suppl (ACCU-CHEK GUIDE) w/Device KIT, USE AS NEEDED TO CHECK BLOOD SUGARS., Disp: 1 kit, Rfl: 0   brimonidine  (ALPHAGAN  P) 0.1 % SOLN, Place 1 drop into both eyes in the morning, at noon, and at bedtime., Disp: , Rfl:    brinzolamide  (AZOPT ) 1 % ophthalmic suspension, Place 1 drop into both eyes 3 (three) times daily., Disp: , Rfl:    carvedilol  (COREG ) 12.5 MG tablet, Take 1 tablet (12.5 mg total) by mouth 2 (two) times daily with a meal., Disp: 180 tablet, Rfl: 3   EPINEPHrine  0.3 mg/0.3 mL IJ SOAJ injection, Inject 0.3 mg into the muscle as needed for anaphylaxis., Disp: 1 each, Rfl: 0   estradiol (ESTRACE) 0.01 % CREA vaginal cream, INSERT 1 GRAM VAGINALLY FOR 45 DAYS, Disp: , Rfl:    ezetimibe  (ZETIA ) 10 MG tablet, Take 1 tablet (10 mg total) by mouth daily., Disp: 90 tablet, Rfl: 1   fluticasone  (FLONASE ) 50 MCG/ACT nasal spray, Place 2 sprays into both nostrils daily., Disp: 18.2 mL, Rfl: 2   glucose blood (ACCU-CHEK GUIDE TEST) test strip, Use as instructed to check blood sugars E11.9, Disp: 200 each, Rfl: 1   guaiFENesin  (MUCINEX ) 600 MG 12 hr tablet, Take 1 tablet (600 mg total) by mouth daily. (Patient taking differently: Take 600 mg by mouth 2 (two) times daily as needed for cough.), Disp: , Rfl:    isosorbide  mononitrate (IMDUR ) 60 MG 24 hr tablet, TAKE 1 TABLET(60 MG) BY MOUTH DAILY, Disp: 90 tablet, Rfl: 3   JANUMET  50-500 MG tablet, TAKE 1 TABLET BY MOUTH TWICE DAILY WITH A MEAL, Disp: 90 tablet, Rfl: 1   ketorolac  (ACULAR ) 0.5 % ophthalmic solution, Place 1 drop into the right eye 2 (two) times daily., Disp: , Rfl:    Lancets (ONETOUCH DELICA PLUS LANCET33G) MISC, CHECK BLOOD SUGAR BEFORE BREAKFAST AND DINNER, Disp: 100 each, Rfl: 1   montelukast  (SINGULAIR ) 10 MG tablet, Take 1 tablet (10  mg total) by mouth daily., Disp: 90 tablet, Rfl: 1   pantoprazole  (PROTONIX ) 40 MG tablet, Take 1 tablet (40 mg total) by mouth daily., Disp: 90 tablet, Rfl: 3   Polyvinyl Alcohol -Povidone PF (REFRESH) 1.4-0.6 % SOLN, Place 1-2 drops into both eyes 3 (three) times daily as needed (dry/irritated eyes.)., Disp: , Rfl:    prednisoLONE  acetate (PRED FORTE ) 1 % ophthalmic suspension, Place 1 drop into both eyes in the morning, at noon, and at bedtime. Place 2 Drops in Left Eye, Place 3 Drops In Right Eye TID, Disp: , Rfl:    pregabalin  (LYRICA ) 75 MG capsule, TAKE 1 CAPSULE(75 MG) BY MOUTH TWICE DAILY, Disp: 180 capsule, Rfl: 1   RHOPRESSA  0.02 % SOLN, OLACE 1 DROP INTO LEFT EYE EVERY NIGHT AT BEDTIME, Disp: , Rfl:    umeclidinium-vilanterol (ANORO ELLIPTA ) 62.5-25 MCG/ACT AEPB, Inhale 1 puff into the lungs daily., Disp: , Rfl:    nitroGLYCERIN  (NITROSTAT ) 0.4 MG SL tablet, Place 1 tablet (0.4 mg total) under the tongue every 5 (five) minutes as needed for chest pain. (Patient not taking: Reported on 03/11/2024), Disp: 25 tablet, Rfl: prn [  2]  Social History Tobacco Use  Smoking Status Former   Current packs/day: 0.00   Average packs/day: 1 pack/day for 4.0 years (4.0 ttl pk-yrs)   Types: Cigarettes   Start date: 02/11/1976   Quit date: 02/11/1980   Years since quitting: 44.1  Smokeless Tobacco Never

## 2024-03-14 ENCOUNTER — Telehealth (HOSPITAL_COMMUNITY): Payer: Self-pay

## 2024-03-14 NOTE — Telephone Encounter (Signed)
 Sheila Reynolds

## 2024-03-14 NOTE — Telephone Encounter (Signed)
 Called pt about inclement weather. LVM with call back number.

## 2024-03-15 ENCOUNTER — Encounter (HOSPITAL_COMMUNITY)
Admission: RE | Admit: 2024-03-15 | Discharge: 2024-03-15 | Disposition: A | Source: Ambulatory Visit | Attending: Internal Medicine

## 2024-03-15 VITALS — Wt 170.4 lb

## 2024-03-15 DIAGNOSIS — J849 Interstitial pulmonary disease, unspecified: Secondary | ICD-10-CM

## 2024-03-15 LAB — GLUCOSE, CAPILLARY
Glucose-Capillary: 106 mg/dL — ABNORMAL HIGH (ref 70–99)
Glucose-Capillary: 88 mg/dL (ref 70–99)

## 2024-03-15 NOTE — Progress Notes (Signed)
 Daily Session Note  Patient Details  Name: Sheila Reynolds MRN: [id number] Date of Birth: 1946/02/09 Referring Provider:   Flowsheet Row PULMONARY REHAB OTHER RESP ORIENTATION from 03/11/2024 in [facility]  Referring Provider Ramaswamy    Encounter Date: 03/15/2024  Check In:  Session Check In - 03/15/24 1054       Check-In   Supervising physician immediately available to respond to emergencies [facility] MD immediately available    Physician(s) Josefa Beauvais, NP    Location [facility]    Staff Present Ronal Levin, RN, Maud Moats, MS, ACSM-CEP, Exercise Physiologist;Casey Claudene Idelia Aris BS, ACSM-CEP, Exercise Physiologist    Virtual Visit No    Medication changes reported     No    Fall or balance concerns reported    Yes    Comments uses cane    Tobacco Cessation No Change    Warm-up and Cool-down Performed as group-led instruction    Resistance Training Performed Yes    VAD Patient? No    PAD/SET Patient? No      Pain Assessment   Currently in Pain? No/denies    Multiple Pain Sites No          Capillary Blood Glucose: Results for orders placed or performed during the hospital encounter of 03/15/24 (from the past 24 hours)  Glucose, capillary     Status: Abnormal   Collection Time: 03/15/24 10:23 AM  Result Value Ref Range   Glucose-Capillary 106 (H) 70 - 99 mg/dL  Glucose, capillary     Status: None   Collection Time: 03/15/24 11:21 AM  Result Value Ref Range   Glucose-Capillary 88 70 - 99 mg/dL   *Note: Due to a large number of results and/or encounters for the requested time period, some results have not been displayed. A complete set of results can be found in Results Review.     Exercise Prescription Changes - 03/15/24 1100       Response to Exercise   Blood Pressure (Admit) 130/70    Blood Pressure (Exercise) 140/90    Blood Pressure (Exit) 120/70    Heart Rate  (Admit) 95 bpm    Heart Rate (Exercise) 85 bpm    Heart Rate (Exit) 88 bpm    Oxygen  Saturation (Admit) 98 %    Oxygen  Saturation (Exercise) 100 %    Oxygen  Saturation (Exit) 99 %    Rating of Perceived Exertion (Exercise) 11    Perceived Dyspnea (Exercise) 1    Duration Continue with 30 min of aerobic exercise without signs/symptoms of physical distress.    Intensity THRR unchanged      Progression   Progression Continue to progress workloads to maintain intensity without signs/symptoms of physical distress.      Resistance Training   Weight Red bands    Reps 10-15    Time 10 Minutes      NuStep   Level 1    SPM 55    Minutes 30    METs 1.3          Tobacco Use History[1]  Goals Met:  Proper associated with RPD/PD & O2 Sat Exercise tolerated well No report of concerns or symptoms today Strength training completed today  Goals Unmet:  Not Applicable  Comments: Service time is from 1030 to 1126.    Dr. Slater Staff is Medical Director for Pulmonary Rehab at [facility].     [1]  Social History Tobacco Use  Smoking Status Former   Current packs/day: 0.00   Average packs/day: 1 pack/day for 4.0 years (4.0 ttl pk-yrs)   Types: Cigarettes   Start date: 02/11/1976   Quit date: 02/11/1980   Years since quitting: 44.1  Smokeless Tobacco Never

## 2024-03-17 ENCOUNTER — Encounter (HOSPITAL_COMMUNITY)
Admission: RE | Admit: 2024-03-17 | Discharge: 2024-03-17 | Disposition: A | Source: Ambulatory Visit | Attending: Internal Medicine

## 2024-03-17 DIAGNOSIS — J849 Interstitial pulmonary disease, unspecified: Secondary | ICD-10-CM

## 2024-03-17 LAB — GLUCOSE, CAPILLARY
Glucose-Capillary: 110 mg/dL — ABNORMAL HIGH (ref 70–99)
Glucose-Capillary: 113 mg/dL — ABNORMAL HIGH (ref 70–99)

## 2024-03-17 NOTE — Progress Notes (Signed)
 Daily Session Note  Patient Details  Name: Sheila Reynolds MRN: [id number] Date of Birth: 18-Apr-1945 Referring Provider:   Flowsheet Row PULMONARY REHAB OTHER RESP ORIENTATION from 03/11/2024 in [facility]  Referring Provider Ramaswamy    Encounter Date: 03/17/2024  Check In:  Session Check In - 03/17/24 1027       Check-In   Supervising physician immediately available to respond to emergencies [facility] MD immediately available    Physician(s) Orren Fabry, PA    Location [facility]    Staff Present Ronal Levin, RN, Maud Moats, MS, ACSM-CEP, Exercise Physiologist;Casey Claudene Idelia Aris BS, ACSM-CEP, Exercise Physiologist    Virtual Visit No    Medication changes reported     No    Fall or balance concerns reported    No    Tobacco Cessation No Change    Warm-up and Cool-down Performed as group-led instruction    Resistance Training Performed Yes    VAD Patient? No    PAD/SET Patient? No      Pain Assessment   Currently in Pain? No/denies          Capillary Blood Glucose: Results for orders placed or performed during the hospital encounter of 03/17/24 (from the past 24 hours)  Glucose, capillary     Status: Abnormal   Collection Time: 03/17/24 10:14 AM  Result Value Ref Range   Glucose-Capillary 110 (H) 70 - 99 mg/dL  Glucose, capillary     Status: Abnormal   Collection Time: 03/17/24 11:38 AM  Result Value Ref Range   Glucose-Capillary 113 (H) 70 - 99 mg/dL   *Note: Due to a large number of results and/or encounters for the requested time period, some results have not been displayed. A complete set of results can be found in Results Review.      Tobacco Use History[1]  Goals Met:  Proper associated with RPD/PD & O2 Sat Exercise tolerated well No report of concerns or symptoms today Strength training completed today  Goals Unmet:  Not Applicable  Comments: Service time is  from 1010 to 1142.    Dr. Slater Staff is Medical Director for Pulmonary Rehab at [facility].     [1]  Social History Tobacco Use  Smoking Status Former   Current packs/day: 0.00   Average packs/day: 1 pack/day for 4.0 years (4.0 ttl pk-yrs)   Types: Cigarettes   Start date: 02/11/1976   Quit date: 02/11/1980   Years since quitting: 44.1  Smokeless Tobacco Never

## 2024-03-22 ENCOUNTER — Encounter (HOSPITAL_COMMUNITY)

## 2024-03-24 ENCOUNTER — Encounter (HOSPITAL_COMMUNITY)

## 2024-03-25 ENCOUNTER — Ambulatory Visit: Admitting: Internal Medicine

## 2024-03-29 ENCOUNTER — Encounter (HOSPITAL_COMMUNITY)

## 2024-03-31 ENCOUNTER — Encounter (HOSPITAL_COMMUNITY)

## 2024-04-05 ENCOUNTER — Encounter (HOSPITAL_COMMUNITY)

## 2024-04-07 ENCOUNTER — Encounter (HOSPITAL_COMMUNITY)

## 2024-04-12 ENCOUNTER — Encounter (HOSPITAL_COMMUNITY)

## 2024-04-14 ENCOUNTER — Encounter (HOSPITAL_COMMUNITY)

## 2024-04-19 ENCOUNTER — Encounter (HOSPITAL_COMMUNITY)

## 2024-04-21 ENCOUNTER — Encounter (HOSPITAL_COMMUNITY)

## 2024-04-26 ENCOUNTER — Encounter (HOSPITAL_COMMUNITY)

## 2024-04-28 ENCOUNTER — Encounter (HOSPITAL_COMMUNITY)

## 2024-04-29 ENCOUNTER — Ambulatory Visit: Admitting: Obstetrics

## 2024-05-03 ENCOUNTER — Encounter (HOSPITAL_COMMUNITY)

## 2024-05-05 ENCOUNTER — Encounter (HOSPITAL_COMMUNITY)

## 2024-05-05 ENCOUNTER — Ambulatory Visit: Payer: Self-pay | Admitting: Internal Medicine

## 2024-05-10 ENCOUNTER — Encounter (HOSPITAL_COMMUNITY)

## 2024-05-12 ENCOUNTER — Encounter (HOSPITAL_COMMUNITY)

## 2024-05-17 ENCOUNTER — Encounter (HOSPITAL_COMMUNITY)

## 2024-05-18 ENCOUNTER — Ambulatory Visit: Admitting: Internal Medicine

## 2024-05-19 ENCOUNTER — Encounter (HOSPITAL_COMMUNITY)

## 2024-05-24 ENCOUNTER — Encounter (HOSPITAL_COMMUNITY)

## 2024-05-26 ENCOUNTER — Encounter (HOSPITAL_COMMUNITY)

## 2024-05-31 ENCOUNTER — Encounter (HOSPITAL_COMMUNITY)

## 2024-06-15 ENCOUNTER — Ambulatory Visit: Payer: Self-pay

## 2024-06-15 ENCOUNTER — Ambulatory Visit

## 2024-06-16 ENCOUNTER — Encounter

## 2024-06-16 ENCOUNTER — Ambulatory Visit: Admitting: Internal Medicine
